# Patient Record
Sex: Male | Born: 1955
Health system: Southern US, Community
[De-identification: ages and names within clinical notes are randomized; demographics above are authoritative.]

## PROBLEM LIST (undated history)

## (undated) ENCOUNTER — Emergency Department (HOSPITAL_COMMUNITY): Admission: EM | Payer: Medicaid Other | Source: Home / Self Care

## (undated) DIAGNOSIS — K635 Polyp of colon: Secondary | ICD-10-CM

## (undated) DIAGNOSIS — G8929 Other chronic pain: Secondary | ICD-10-CM

## (undated) DIAGNOSIS — I10 Essential (primary) hypertension: Secondary | ICD-10-CM

## (undated) DIAGNOSIS — M069 Rheumatoid arthritis, unspecified: Secondary | ICD-10-CM

## (undated) DIAGNOSIS — K5790 Diverticulosis of intestine, part unspecified, without perforation or abscess without bleeding: Secondary | ICD-10-CM

## (undated) DIAGNOSIS — E119 Type 2 diabetes mellitus without complications: Secondary | ICD-10-CM

## (undated) DIAGNOSIS — I5042 Chronic combined systolic (congestive) and diastolic (congestive) heart failure: Secondary | ICD-10-CM

## (undated) DIAGNOSIS — K5730 Diverticulosis of large intestine without perforation or abscess without bleeding: Secondary | ICD-10-CM

## (undated) DIAGNOSIS — I4819 Other persistent atrial fibrillation: Secondary | ICD-10-CM

## (undated) DIAGNOSIS — K222 Esophageal obstruction: Secondary | ICD-10-CM

## (undated) DIAGNOSIS — I2510 Atherosclerotic heart disease of native coronary artery without angina pectoris: Secondary | ICD-10-CM

## (undated) DIAGNOSIS — N183 Chronic kidney disease, stage 3 unspecified: Secondary | ICD-10-CM

## (undated) DIAGNOSIS — K219 Gastro-esophageal reflux disease without esophagitis: Secondary | ICD-10-CM

## (undated) DIAGNOSIS — K2970 Gastritis, unspecified, without bleeding: Secondary | ICD-10-CM

## (undated) DIAGNOSIS — K449 Diaphragmatic hernia without obstruction or gangrene: Secondary | ICD-10-CM

## (undated) HISTORY — DX: Rheumatoid arthritis, unspecified: M06.9

## (undated) HISTORY — DX: Other chronic pain: G89.29

## (undated) HISTORY — DX: Morbid (severe) obesity due to excess calories: E66.01

## (undated) HISTORY — DX: Gastritis, unspecified, without bleeding: K29.70

## (undated) HISTORY — DX: Polyp of colon: K63.5

## (undated) HISTORY — DX: Chronic kidney disease, stage 3 (moderate): N18.3

## (undated) HISTORY — DX: Gastro-esophageal reflux disease without esophagitis: K21.9

## (undated) HISTORY — DX: Essential (primary) hypertension: I10

## (undated) HISTORY — DX: Esophageal obstruction: K22.2

## (undated) HISTORY — DX: Chronic kidney disease, stage 3 unspecified: N18.30

## (undated) HISTORY — DX: Diaphragmatic hernia without obstruction or gangrene: K44.9

## (undated) HISTORY — DX: Diverticulosis of intestine, part unspecified, without perforation or abscess without bleeding: K57.90

## (undated) HISTORY — DX: Other persistent atrial fibrillation: I48.19

## (undated) HISTORY — DX: Chronic combined systolic (congestive) and diastolic (congestive) heart failure: I50.42

## (undated) HISTORY — DX: Diverticulosis of large intestine without perforation or abscess without bleeding: K57.30

---

## 2013-07-29 DIAGNOSIS — K2970 Gastritis, unspecified, without bleeding: Secondary | ICD-10-CM

## 2013-07-29 DIAGNOSIS — K449 Diaphragmatic hernia without obstruction or gangrene: Secondary | ICD-10-CM

## 2013-07-29 HISTORY — DX: Diaphragmatic hernia without obstruction or gangrene: K44.9

## 2013-07-29 HISTORY — DX: Gastritis, unspecified, without bleeding: K29.70

## 2013-08-01 DIAGNOSIS — K635 Polyp of colon: Secondary | ICD-10-CM

## 2013-08-01 DIAGNOSIS — K5730 Diverticulosis of large intestine without perforation or abscess without bleeding: Secondary | ICD-10-CM

## 2013-08-01 HISTORY — DX: Diverticulosis of large intestine without perforation or abscess without bleeding: K57.30

## 2013-08-01 HISTORY — DX: Polyp of colon: K63.5

## 2013-09-11 HISTORY — PX: COLON RESECTION: SHX5231

## 2014-05-11 HISTORY — PX: CARDIAC CATHETERIZATION: SHX172

## 2014-09-11 HISTORY — PX: COLON SURGERY: SHX602

## 2014-12-23 ENCOUNTER — Encounter (HOSPITAL_COMMUNITY): Payer: Self-pay | Admitting: Family Medicine

## 2014-12-23 ENCOUNTER — Emergency Department (HOSPITAL_COMMUNITY): Payer: Medicaid - Out of State

## 2014-12-23 ENCOUNTER — Inpatient Hospital Stay (HOSPITAL_COMMUNITY)
Admission: EM | Admit: 2014-12-23 | Discharge: 2014-12-27 | DRG: 315 | Disposition: A | Discharge status: Home / Self Care | Payer: Medicaid - Out of State | Attending: Internal Medicine | Admitting: Internal Medicine

## 2014-12-23 DIAGNOSIS — N189 Chronic kidney disease, unspecified: Secondary | ICD-10-CM | POA: Diagnosis present

## 2014-12-23 DIAGNOSIS — R079 Chest pain, unspecified: Secondary | ICD-10-CM | POA: Diagnosis not present

## 2014-12-23 DIAGNOSIS — G629 Polyneuropathy, unspecified: Secondary | ICD-10-CM | POA: Diagnosis present

## 2014-12-23 DIAGNOSIS — N141 Nephropathy induced by other drugs, medicaments and biological substances: Secondary | ICD-10-CM | POA: Diagnosis present

## 2014-12-23 DIAGNOSIS — E119 Type 2 diabetes mellitus without complications: Secondary | ICD-10-CM | POA: Diagnosis present

## 2014-12-23 DIAGNOSIS — K279 Peptic ulcer, site unspecified, unspecified as acute or chronic, without hemorrhage or perforation: Secondary | ICD-10-CM | POA: Diagnosis present

## 2014-12-23 DIAGNOSIS — I25119 Atherosclerotic heart disease of native coronary artery with unspecified angina pectoris: Secondary | ICD-10-CM | POA: Diagnosis present

## 2014-12-23 DIAGNOSIS — I252 Old myocardial infarction: Secondary | ICD-10-CM | POA: Diagnosis not present

## 2014-12-23 DIAGNOSIS — Z794 Long term (current) use of insulin: Secondary | ICD-10-CM

## 2014-12-23 DIAGNOSIS — R7989 Other specified abnormal findings of blood chemistry: Secondary | ICD-10-CM

## 2014-12-23 DIAGNOSIS — Z7982 Long term (current) use of aspirin: Secondary | ICD-10-CM | POA: Diagnosis not present

## 2014-12-23 DIAGNOSIS — F40240 Claustrophobia: Secondary | ICD-10-CM | POA: Diagnosis present

## 2014-12-23 DIAGNOSIS — I2510 Atherosclerotic heart disease of native coronary artery without angina pectoris: Secondary | ICD-10-CM

## 2014-12-23 DIAGNOSIS — I25118 Atherosclerotic heart disease of native coronary artery with other forms of angina pectoris: Secondary | ICD-10-CM | POA: Diagnosis not present

## 2014-12-23 DIAGNOSIS — K5900 Constipation, unspecified: Secondary | ICD-10-CM | POA: Diagnosis present

## 2014-12-23 DIAGNOSIS — Z7952 Long term (current) use of systemic steroids: Secondary | ICD-10-CM | POA: Diagnosis not present

## 2014-12-23 DIAGNOSIS — I319 Disease of pericardium, unspecified: Principal | ICD-10-CM | POA: Diagnosis present

## 2014-12-23 DIAGNOSIS — R072 Precordial pain: Secondary | ICD-10-CM | POA: Diagnosis not present

## 2014-12-23 DIAGNOSIS — N179 Acute kidney failure, unspecified: Secondary | ICD-10-CM | POA: Diagnosis present

## 2014-12-23 DIAGNOSIS — E780 Pure hypercholesterolemia: Secondary | ICD-10-CM | POA: Diagnosis present

## 2014-12-23 DIAGNOSIS — T508X5A Adverse effect of diagnostic agents, initial encounter: Secondary | ICD-10-CM | POA: Diagnosis present

## 2014-12-23 DIAGNOSIS — D649 Anemia, unspecified: Secondary | ICD-10-CM | POA: Diagnosis not present

## 2014-12-23 DIAGNOSIS — I129 Hypertensive chronic kidney disease with stage 1 through stage 4 chronic kidney disease, or unspecified chronic kidney disease: Secondary | ICD-10-CM | POA: Diagnosis present

## 2014-12-23 DIAGNOSIS — E1165 Type 2 diabetes mellitus with hyperglycemia: Secondary | ICD-10-CM | POA: Diagnosis not present

## 2014-12-23 DIAGNOSIS — I4891 Unspecified atrial fibrillation: Secondary | ICD-10-CM

## 2014-12-23 DIAGNOSIS — N2889 Other specified disorders of kidney and ureter: Secondary | ICD-10-CM

## 2014-12-23 DIAGNOSIS — E785 Hyperlipidemia, unspecified: Secondary | ICD-10-CM

## 2014-12-23 DIAGNOSIS — E860 Dehydration: Secondary | ICD-10-CM | POA: Diagnosis present

## 2014-12-23 DIAGNOSIS — I429 Cardiomyopathy, unspecified: Secondary | ICD-10-CM | POA: Diagnosis present

## 2014-12-23 DIAGNOSIS — R778 Other specified abnormalities of plasma proteins: Secondary | ICD-10-CM

## 2014-12-23 DIAGNOSIS — I10 Essential (primary) hypertension: Secondary | ICD-10-CM | POA: Diagnosis not present

## 2014-12-23 DIAGNOSIS — I309 Acute pericarditis, unspecified: Secondary | ICD-10-CM | POA: Diagnosis not present

## 2014-12-23 HISTORY — DX: Type 2 diabetes mellitus without complications: E11.9

## 2014-12-23 HISTORY — DX: Atherosclerotic heart disease of native coronary artery without angina pectoris: I25.10

## 2014-12-23 LAB — LIPID PANEL
CHOLESTEROL: 127 mg/dL (ref 0–200)
HDL: 34 mg/dL — ABNORMAL LOW (ref >40)
LDL CALC: 55 mg/dL (ref 0–99)
TRIGLYCERIDES: 192 mg/dL — AB (ref <150)
Total CHOL/HDL Ratio: 3.7 RATIO
VLDL: 38 mg/dL (ref 0–40)

## 2014-12-23 LAB — I-STAT TROPONIN, ED: TROPONIN I, POC: 0.04 ng/mL (ref 0.00–0.08)

## 2014-12-23 LAB — BASIC METABOLIC PANEL
Anion gap: 12 (ref 5–15)
BUN: 8 mg/dL (ref 6–20)
CHLORIDE: 102 mmol/L (ref 101–111)
CO2: 25 mmol/L (ref 22–32)
Calcium: 8.9 mg/dL (ref 8.9–10.3)
Creatinine, Ser: 1.2 mg/dL (ref 0.61–1.24)
GFR calc non Af Amer: >60 mL/min (ref >60)
GLUCOSE: 175 mg/dL — AB (ref 65–99)
POTASSIUM: 3.5 mmol/L (ref 3.5–5.1)
Sodium: 139 mmol/L (ref 135–145)

## 2014-12-23 LAB — GLUCOSE, CAPILLARY: GLUCOSE-CAPILLARY: 393 mg/dL — AB (ref 65–99)

## 2014-12-23 LAB — CBC
HCT: 46.1 % (ref 39.0–52.0)
HCT: 46.5 % (ref 39.0–52.0)
HEMOGLOBIN: 14.7 g/dL (ref 13.0–17.0)
Hemoglobin: 14.6 g/dL (ref 13.0–17.0)
MCH: 30.1 pg (ref 26.0–34.0)
MCH: 30.4 pg (ref 26.0–34.0)
MCHC: 31.7 g/dL (ref 30.0–36.0)
MCHC: 31.6 g/dL (ref 30.0–36.0)
MCV: 95.1 fL (ref 78.0–100.0)
MCV: 96.3 fL (ref 78.0–100.0)
PLATELETS: 178 10*3/uL (ref 150–400)
Platelets: 186 10*3/uL (ref 150–400)
RBC: 4.85 MIL/uL (ref 4.22–5.81)
RBC: 4.83 MIL/uL (ref 4.22–5.81)
RDW: 14.5 % (ref 11.5–15.5)
RDW: 14.7 % (ref 11.5–15.5)
WBC: 9.5 10*3/uL (ref 4.0–10.5)
WBC: 10.6 10*3/uL — AB (ref 4.0–10.5)

## 2014-12-23 LAB — CREATININE, SERUM
CREATININE: 1.32 mg/dL — AB (ref 0.61–1.24)
GFR calc non Af Amer: 57 mL/min — ABNORMAL LOW (ref >60)

## 2014-12-23 LAB — TSH: TSH: 0.757 u[IU]/mL (ref 0.350–4.500)

## 2014-12-23 LAB — BRAIN NATRIURETIC PEPTIDE: B NATRIURETIC PEPTIDE 5: 76.5 pg/mL (ref 0.0–100.0)

## 2014-12-23 LAB — TROPONIN I: Troponin I: 0.06 ng/mL — ABNORMAL HIGH (ref <0.031)

## 2014-12-23 LAB — D-DIMER, QUANTITATIVE (NOT AT ARMC): D DIMER QUANT: 1.11 ug{FEU}/mL — AB (ref 0.00–0.48)

## 2014-12-23 MED ORDER — LISINOPRIL 20 MG PO TABS
30.0000 mg | ORAL_TABLET | Freq: Every day | ORAL | Status: DC
  Administered 2014-12-25: 30 mg via ORAL
  Filled 2014-12-23 (×2): qty 1

## 2014-12-23 MED ORDER — PANTOPRAZOLE SODIUM 40 MG PO TBEC
40.0000 mg | DELAYED_RELEASE_TABLET | Freq: Two times a day (BID) | ORAL | Status: DC
  Administered 2014-12-23 – 2014-12-27 (×8): 40 mg via ORAL
  Filled 2014-12-23 (×8): qty 1

## 2014-12-23 MED ORDER — INSULIN ASPART 100 UNIT/ML ~~LOC~~ SOLN
0.0000 [IU] | SUBCUTANEOUS | Status: DC
  Administered 2014-12-23: 9 [IU] via SUBCUTANEOUS
  Administered 2014-12-24 (×2): 3 [IU] via SUBCUTANEOUS
  Administered 2014-12-24: 5 [IU] via SUBCUTANEOUS

## 2014-12-23 MED ORDER — ONDANSETRON HCL 4 MG/2ML IJ SOLN: 4.0000 mg | Freq: Four times a day (QID) | INTRAMUSCULAR | Status: DC | PRN

## 2014-12-23 MED ORDER — ACETAMINOPHEN 325 MG PO TABS
650.0000 mg | ORAL_TABLET | ORAL | Status: DC | PRN
  Administered 2014-12-24 – 2014-12-26 (×2): 650 mg via ORAL
  Filled 2014-12-23 (×2): qty 2

## 2014-12-23 MED ORDER — FAMOTIDINE 20 MG PO TABS
10.0000 mg | ORAL_TABLET | Freq: Every day | ORAL | Status: DC
  Administered 2014-12-24 – 2014-12-25 (×2): 10 mg via ORAL
  Filled 2014-12-23 (×2): qty 1

## 2014-12-23 MED ORDER — ASPIRIN 81 MG PO CHEW
324.0000 mg | CHEWABLE_TABLET | Freq: Once | ORAL | Status: AC
  Administered 2014-12-23: 324 mg via ORAL
  Filled 2014-12-23: qty 4

## 2014-12-23 MED ORDER — NITROGLYCERIN 0.4 MG SL SUBL
0.4000 mg | SUBLINGUAL_TABLET | SUBLINGUAL | Status: DC | PRN
  Administered 2014-12-23 (×2): 0.4 mg via SUBLINGUAL
  Filled 2014-12-23 (×2): qty 1

## 2014-12-23 MED ORDER — MORPHINE SULFATE 4 MG/ML IJ SOLN
4.0000 mg | Freq: Once | INTRAMUSCULAR | Status: AC
Start: 2014-12-23 — End: 2014-12-23
  Administered 2014-12-23: 4 mg via INTRAVENOUS
  Filled 2014-12-23: qty 1

## 2014-12-23 MED ORDER — ATORVASTATIN CALCIUM 40 MG PO TABS
40.0000 mg | ORAL_TABLET | Freq: Every day | ORAL | Status: DC
Start: 2014-12-24 — End: 2014-12-27
  Administered 2014-12-24 – 2014-12-27 (×4): 40 mg via ORAL
  Filled 2014-12-23 (×4): qty 1

## 2014-12-23 MED ORDER — ASPIRIN EC 325 MG PO TBEC
325.0000 mg | DELAYED_RELEASE_TABLET | Freq: Every day | ORAL | Status: DC
  Administered 2014-12-24 – 2014-12-27 (×4): 325 mg via ORAL
  Filled 2014-12-23 (×4): qty 1

## 2014-12-23 MED ORDER — NITROGLYCERIN IN D5W 200-5 MCG/ML-% IV SOLN
0.0000 ug/min | INTRAVENOUS | Status: DC
  Administered 2014-12-23: 5 ug/min via INTRAVENOUS
  Administered 2014-12-24: 15 ug/min via INTRAVENOUS
  Filled 2014-12-23: qty 250

## 2014-12-23 MED ORDER — HEPARIN SODIUM (PORCINE) 5000 UNIT/ML IJ SOLN: 5000.0000 [IU] | Freq: Three times a day (TID) | INTRAMUSCULAR | Status: DC

## 2014-12-23 MED ORDER — CHLORHEXIDINE GLUCONATE CLOTH 2 % EX PADS
6.0000 | MEDICATED_PAD | Freq: Every day | CUTANEOUS | Status: DC
  Administered 2014-12-25: 6 via TOPICAL

## 2014-12-23 MED ORDER — ONDANSETRON HCL 4 MG/2ML IJ SOLN
4.0000 mg | Freq: Once | INTRAMUSCULAR | Status: AC
  Administered 2014-12-23: 4 mg via INTRAVENOUS
  Filled 2014-12-23: qty 2

## 2014-12-23 MED ORDER — CARVEDILOL 25 MG PO TABS
25.0000 mg | ORAL_TABLET | Freq: Two times a day (BID) | ORAL | Status: DC
  Administered 2014-12-24 – 2014-12-27 (×7): 25 mg via ORAL
  Filled 2014-12-23 (×7): qty 1

## 2014-12-23 MED ORDER — IOHEXOL 350 MG/ML SOLN
75.0000 mL | Freq: Once | INTRAVENOUS | Status: AC | PRN
  Administered 2014-12-23: 75 mL via INTRAVENOUS

## 2014-12-23 MED ORDER — INSULIN DETEMIR 100 UNIT/ML ~~LOC~~ SOLN
80.0000 [IU] | Freq: Every day | SUBCUTANEOUS | Status: DC
  Administered 2014-12-23 – 2014-12-24 (×2): 80 [IU] via SUBCUTANEOUS
  Filled 2014-12-23 (×3): qty 0.8

## 2014-12-23 MED ORDER — MORPHINE SULFATE 4 MG/ML IJ SOLN
4.0000 mg | Freq: Once | INTRAMUSCULAR | Status: AC
  Administered 2014-12-23: 4 mg via INTRAVENOUS
  Filled 2014-12-23: qty 1

## 2014-12-23 MED ORDER — HEPARIN BOLUS VIA INFUSION
4000.0000 [IU] | Freq: Once | INTRAVENOUS | Status: AC
  Administered 2014-12-23: 4000 [IU] via INTRAVENOUS
  Filled 2014-12-23: qty 4000

## 2014-12-23 MED ORDER — FUROSEMIDE 40 MG PO TABS
40.0000 mg | ORAL_TABLET | Freq: Every day | ORAL | Status: DC
  Administered 2014-12-24 – 2014-12-25 (×2): 40 mg via ORAL
  Filled 2014-12-23 (×2): qty 1

## 2014-12-23 MED ORDER — NITROGLYCERIN 0.4 MG/HR TD PT24
0.4000 mg | MEDICATED_PATCH | TRANSDERMAL | Status: DC
  Filled 2014-12-23: qty 1

## 2014-12-23 MED ORDER — GABAPENTIN 600 MG PO TABS
600.0000 mg | ORAL_TABLET | Freq: Three times a day (TID) | ORAL | Status: DC
  Administered 2014-12-23 – 2014-12-27 (×11): 600 mg via ORAL
  Filled 2014-12-23 (×11): qty 1

## 2014-12-23 MED ORDER — HEPARIN (PORCINE) IN NACL 100-0.45 UNIT/ML-% IJ SOLN
1200.0000 [IU]/h | INTRAMUSCULAR | Status: DC
  Administered 2014-12-23: 1200 [IU]/h via INTRAVENOUS
  Filled 2014-12-23 (×2): qty 250

## 2014-12-23 MED ORDER — DILTIAZEM HCL ER 180 MG PO CP24
180.0000 mg | ORAL_CAPSULE | Freq: Every day | ORAL | Status: DC
  Administered 2014-12-24 – 2014-12-27 (×4): 180 mg via ORAL
  Filled 2014-12-23 (×8): qty 1

## 2014-12-23 NOTE — ED Notes (Signed)
Patient transported to X-ray 

## 2014-12-23 NOTE — Progress Notes (Signed)
ANTICOAGULATION CONSULT NOTE - Initial Consult  Pharmacy Consult for Heparin  Indication: chest pain/ACS  No Known Allergies  Patient Measurements: Height: 5\' 6"  (167.6 cm) Weight: 225 lb 12 oz (102.4 kg) IBW/kg (Calculated) : 63.8  Vital Signs: Temp: 98.2 F (36.8 C) (05/14 2233) Temp Source: Oral (05/14 2233) BP: 137/71 mmHg (05/14 2233) Pulse Rate: 86 (05/14 2233)  Labs:  Recent Labs  12/23/14 1517 12/23/14 2150  HGB 14.6 14.7  HCT 46.1 46.5  PLT 186 178  CREATININE 1.20  --     Estimated Creatinine Clearance: 74.3 mL/min (by C-G formula based on Cr of 1.2).   Medical History: Past Medical History  Diagnosis Date  . Coronary artery disease   . Hypertension   . Diabetes mellitus without complication   . Arthritis     Assessment: 59 y/o M with chest pain, also has hx of afib with previous anticoagulation (but not taking now per MD due to anemia of unknown source), CBC is good today, renal function ok, other labs as above.   Goal of Therapy:  Heparin level 0.3-0.7 units/ml Monitor platelets by anticoagulation protocol: Yes   Plan:  -Heparin 4000 units BOLUS -Start heparin drip at 1200 units/hr -0700 HL -Daily CBC/HL -Monitor for bleeding  Narda Bonds 12/23/2014,10:43 PM

## 2014-12-23 NOTE — ED Provider Notes (Signed)
CSN: [id number]     Arrival date & time 12/23/14  1428 History   First MD Initiated Contact with Patient 12/23/14 1507     Chief Complaint  Patient presents with  . Chest Pain     (Consider location/radiation/quality/duration/timing/severity/associated sxs/prior Treatment) HPI  Pt presents with c/o chest pain and shortness of breath. He hzs hx of DM, HTN, CAD, paroxysmal afib.  Had MI in July 2015.  He states his symptoms feel similar to his prior MI, but also he cannot tell if it may be his reflux pain.  Due to symptoms lasting on and off since yesterday he presented to the ED for evaluation.  No fever/chills.  No cough.  No leg swelling.  He states symptoms are simlar to when he had MI in the past.  There are no other associated systemic symptoms, there are no other alleviating or modifying factors.   Past Medical History  Diagnosis Date  . Coronary artery disease   . Hypertension   . Diabetes mellitus without complication   . Arthritis    Past Surgical History  Procedure Laterality Date  . Colon surgery  09/2014    colon resection   . Cardiac catheterization  05/2014    ablation for atrial fibrillation   History reviewed. No pertinent family history. History  Substance Use Topics  . Smoking status: Never Smoker   . Smokeless tobacco: Not on file  . Alcohol Use: No    Review of Systems  ROS reviewed and all otherwise negative except for mentioned in HPI    Allergies  Review of patient's allergies indicates no known allergies.  Home Medications   Prior to Admission medications   Medication Sig Start Date End Date Taking? Authorizing Provider  aspirin EC 81 MG tablet Take 81 mg by mouth daily.   Yes Historical Provider, MD  atorvastatin (LIPITOR) 40 MG tablet Take 40 mg by mouth daily.   Yes Historical Provider, MD  carvedilol (COREG) 25 MG tablet Take 25 mg by mouth 2 (two) times daily.   Yes Historical Provider, MD  diltiazem (DILACOR XR) 180 MG 24 hr capsule Take  180 mg by mouth daily.    Yes Historical Provider, MD  furosemide (LASIX) 40 MG tablet Take 40 mg by mouth daily.   Yes Historical Provider, MD  gabapentin (NEURONTIN) 600 MG tablet Take 600 mg by mouth 3 (three) times daily.   Yes Historical Provider, MD  insulin detemir (LEVEMIR) 100 UNIT/ML injection Inject 80 Units into the skin at bedtime.   Yes Historical Provider, MD  Insulin Glulisine (APIDRA) 100 UNIT/ML Solostar Pen Inject 16 Units into the skin 2 (two) times daily.   Yes Historical Provider, MD  lisinopril (PRINIVIL,ZESTRIL) 30 MG tablet Take 30 mg by mouth daily.   Yes Historical Provider, MD  pantoprazole (PROTONIX) 40 MG tablet Take 40 mg by mouth 2 (two) times daily.   Yes Historical Provider, MD  Polyvinyl Alcohol-Povidone (REFRESH OP) Place 1 drop into both eyes 3 (three) times daily as needed (dry eyes).   Yes Historical Provider, MD  predniSONE (DELTASONE) 10 MG tablet Take 10 mg by mouth 2 (two) times daily.   Yes Historical Provider, MD  ranitidine (ZANTAC) 150 MG capsule Take 150 mg by mouth 2 (two) times daily.   Yes Historical Provider, MD  sildenafil (VIAGRA) 100 MG tablet Take 100 mg by mouth daily as needed for erectile dysfunction.   Yes Historical Provider, MD  sitaGLIPtin (JANUVIA) 100 MG tablet Take 100  mg by mouth daily.   Yes Historical Provider, MD   BP 117/79 mmHg  Pulse 72  Temp(Src) 98.8 F (37.1 C) (Oral)  Resp 18  Ht 5\' 6"  (1.676 m)  Wt 225 lb 12 oz (102.4 kg)  BMI 36.45 kg/m2  SpO2 95% Physical Exam  Physical Examination: General appearance - alert, well appearing, and in no distress Mental status - alert, oriented to person, place, and time Eyes - no conjunctival injection, no scleral icterus Mouth - mucous membranes moist, pharynx normal without lesions Chest - clear to auscultation, no wheezes, rales or rhonchi, symmetric air entry Heart - normal rate, regular rhythm, normal S1, S2, no murmurs, rubs, clicks or gallops Abdomen - soft, nontender,  nondistended, no masses or organomegaly Neurological - alert, oriented, normal speech, no focal findings or movement disorder noted Extremities - peripheral pulses normal, no pedal edema, no clubbing or cyanosis Skin - normal coloration and turgor, no rashes,  ED Course  Procedures (including critical care time)  7:52 PM pt states now he is chest pain free.   9:14 PM d/w Dr. Ernestina Patches, triad hospitalist.  He will see patient, requests cardiology consult.  I will page now.   9:38 PM d/w [name], cardiology- she will d/w Dr. Ernestina Patches about this patient.   Labs Review Labs Reviewed  BASIC METABOLIC PANEL - Abnormal; Notable for the following:    Glucose, Bld 175 (*)    All other components within normal limits  D-DIMER, QUANTITATIVE - Abnormal; Notable for the following:    D-Dimer, Quant 1.11 (*)    All other components within normal limits  TROPONIN I - Abnormal; Notable for the following:    Troponin I 0.06 (*)    All other components within normal limits  TROPONIN I - Abnormal; Notable for the following:    Troponin I 0.04 (*)    All other components within normal limits  TROPONIN I - Abnormal; Notable for the following:    Troponin I 0.04 (*)    All other components within normal limits  CBC - Abnormal; Notable for the following:    WBC 10.6 (*)    All other components within normal limits  CREATININE, SERUM - Abnormal; Notable for the following:    Creatinine, Ser 1.32 (*)    GFR calc non Af Amer 57 (*)    All other components within normal limits  LIPID PANEL - Abnormal; Notable for the following:    Triglycerides 192 (*)    HDL 34 (*)    All other components within normal limits  GLUCOSE, CAPILLARY - Abnormal; Notable for the following:    Glucose-Capillary 393 (*)    All other components within normal limits  CBC - Abnormal; Notable for the following:    WBC 11.3 (*)    All other components within normal limits  SEDIMENTATION RATE - Abnormal; Notable for the  following:    Sed Rate 20 (*)    All other components within normal limits  GLUCOSE, CAPILLARY - Abnormal; Notable for the following:    Glucose-Capillary 244 (*)    All other components within normal limits  GLUCOSE, CAPILLARY - Abnormal; Notable for the following:    Glucose-Capillary 221 (*)    All other components within normal limits  TROPONIN I - Abnormal; Notable for the following:    Troponin I 0.06 (*)    All other components within normal limits  BASIC METABOLIC PANEL - Abnormal; Notable for the following:    Chloride  100 (*)    Glucose, Bld 297 (*)    Creatinine, Ser 1.42 (*)    Calcium 8.6 (*)    GFR calc non Af Amer 53 (*)    All other components within normal limits  GLUCOSE, CAPILLARY - Abnormal; Notable for the following:    Glucose-Capillary 277 (*)    All other components within normal limits  MRSA PCR SCREENING  CBC  BRAIN NATRIURETIC PEPTIDE  TSH  HEPARIN LEVEL (UNFRACTIONATED)  C-REACTIVE PROTEIN  HEPARIN LEVEL (UNFRACTIONATED)  HEMOGLOBIN A1C  TROPONIN I  TROPONIN I  I-STAT TROPOININ, ED    Imaging Review Dg Chest 2 View  12/23/2014   CLINICAL DATA:  Mid chest pain beginning yesterday.  EXAM: CHEST  2 VIEW  COMPARISON:  None.  FINDINGS: Lungs are adequately inflated without consolidation or effusion. Cardiomediastinal silhouette is within normal. Mild degenerative change of the spine with prominent osteophytes over the mid to lower thoracic spine.  IMPRESSION: No active cardiopulmonary disease.   Electronically Signed   By: Marin Olp M.D.   On: 12/23/2014 16:11   Ct Angio Chest Pe W/cm &/or Wo Cm  12/23/2014   CLINICAL DATA:  59 year old male with chest pain and shortness of breath for 1 day.  EXAM: CT ANGIOGRAPHY CHEST WITH CONTRAST  TECHNIQUE: Multidetector CT imaging of the chest was performed using the standard protocol during bolus administration of intravenous contrast. Multiplanar CT image reconstructions and MIPs were obtained to evaluate the  vascular anatomy.  CONTRAST:  24mL OMNIPAQUE IOHEXOL 350 MG/ML SOLN  COMPARISON:  12/23/2014 chest radiograph  FINDINGS: This is a technically satisfactory study.  Mediastinum/Nodes: No pulmonary emboli are identified. There is no evidence of thoracic aortic aneurysm. Cardiomegaly is identified. Mild mild-moderate coronary artery calcifications are present. No enlarged lymph nodes. No pericardial effusion.  Lungs/Pleura: No pleural effusion identified. Mild dependent and bibasilar atelectasis noted. There is no evidence of airspace disease, consolidation, nodule or mass. No endobronchial or endotracheal lesions identified.  Upper abdomen: Hepatic steatosis and nonobstructing left upper pole renal calculi noted. A 2 cm indeterminate right upper renal lesion identified.  Musculoskeletal: No acute or suspicious bony abnormalities identified.  Review of the MIP images confirms the above findings.  IMPRESSION: No evidence of pulmonary emboli or thoracic aortic aneurysm.  Cardiomegaly and coronary artery disease.  Mild dependent and bibasilar atelectasis.  2 cm indeterminate right upper renal lesion. Consider further evaluation first with ultrasound if no prior outside studies are available for comparison.  Hepatic steatosis and nonobstructing left upper pole renal calculi.   Electronically Signed   By: Margarette Canada M.D.   On: 12/23/2014 18:50     EKG Interpretation   Date/Time:  Saturday Dec 23 2014 14:35:31 EDT Ventricular Rate:  75 PR Interval:  120 QRS Duration: 90 QT Interval:  382 QTC Calculation: 426 R Axis:   -7 Text Interpretation:  Normal sinus rhythm Left ventricular hypertrophy ST  \T\ T wave abnormality, consider inferior ischemia Abnormal ECG No old  tracing to compare Confirmed by [facility]  MD, Allegra Cerniglia [phone]) on 12/23/2014  3:19:50 PM      MDM   Final diagnoses:  Chest pain  Renal mass, right    Pt presenting with c/o chest pain and shortness of breath.  Pt states symptoms are similar  to his prior MI.  He is visiting from [place]- his doctor's are there.  Will work up for ACS, d-dimer due to recent traveling history.  Initial labs are reassuring, ekg does  show some t wave inversions in infeerior leads- there  Is no old EKG for comparison.  Pt to be admitted to triad for further evaluation. Pt has no chest pain after nitro and morphine in the ED.  [name] Kitchenpt is agreeable with this plan.      Alfonzo Beers, MD 12/24/14 [phone]

## 2014-12-23 NOTE — H&P (Addendum)
Hospitalist Admission History and Physical  Patient name: Marco [name] Medical record number: [id number] Date of birth: [birth year] Age: 59 y.o. Gender: male  Primary Care Provider: Pcp Not In System  Chief Complaint: chest pain  History of Present Illness:This is a 59 y.o. year old male with significant past medical history of IDDM, HTN, atrial fibrillation s/p ablation, CAD s/p MI 02/2014, HTN presenting with chest pain. Pt is originally from [place]. Has been in area for the past 1-2 weeks. Patient reports progressive chest pain over the past 2 days. Chest pain mainly central in nature with some radiation down the arms by bilaterally. Mild associated nausea. Pain also worse with deep breathing. Denies any recent heavy lifting or trauma. Has been compliant with medication regimen at home. States that he went to his cardiologist in [place] approximately 1-2 months ago with a fairly normal stress test. Presented to the ER afebrile, hemodynamically stable. CBC and blood work otherwise within normal limits. Troponin negative 1. EKG normal sinus rhythm with noted persistent T-wave inversions in anterior lateral leads. No old EKG for comparison. Patient states chest pains consistent with prior MI back in July of last year. CT angina negative for PE. Does show cardiomegaly. Pain improved w/ ASA, sublingual NTG and morphine.  HEART Score 6+  Assessment and Plan: Marco [name] is a 59 y.o. year old male presenting with Chest Pain    Active Problems:   Chest pain   1- Chest Pain  -overlapping mixed and typical sxs-noted anterior chest wall TTP  -higher concern for cardiac source of sxs given similar sxs w/ MI 02/2014 -trp neg x 1 -EKG w/ noted TWI in lateral laeads -pending formal cards consult (I have asked EDP for formal consult given pt's prior cardiac history) -full dose ASA -2D ECHO  -NTG -cycle CEs  -risk stratification labs -tele bed  -follow   2-CAD/HTN -noted concern for active  disease given above -full dose ASA -NTG  -BP stable  -cont home regimen -f/u cards input  3-IDDM -cont levemir -SSI  -A1C  4-Atrial fibrillation s/p ablation -NSR on EKG -cont CCB -noted hx/o anticoagulation assd anemia w/ NOAC (no source found)- currently not taking.  -tele bed -follow up cards recs  5-Neuropathy -stable  -cont neurontin    FEN/GI: heart healthy-carb modified diet  Prophylaxis: sub q heparin  Disposition: pending further evaluation Code Status: Full Code   Clinical Update: Discussed case w/ [name] w/ cardiology. Would like to place pt on full dose heparin overnight. Hgb stable. Cards will formally consult in am.  Patient Active Problem List   Diagnosis Date Noted  . Chest pain 12/23/2014   Past Medical History: Past Medical History  Diagnosis Date  . Coronary artery disease   . Hypertension   . Diabetes mellitus without complication   . Arthritis     Past Surgical History: History reviewed. No pertinent past surgical history.  Social History: History   Social History  . Marital Status: Married    Spouse Name: N/A  . Number of Children: N/A  . Years of Education: N/A   Social History Main Topics  . Smoking status: Never Smoker   . Smokeless tobacco: Not on file  . Alcohol Use: No  . Drug Use: No  . Sexual Activity: Not on file   Other Topics Concern  . None   Social History Narrative  . None    Family History: History reviewed. No pertinent family history.  Allergies: No Known Allergies  Current Facility-Administered Medications  Medication Dose Route Frequency Provider Last Rate Last Dose  . acetaminophen (TYLENOL) tablet 650 mg  650 mg Oral Q4H PRN Deneise Lever, MD      . aspirin EC tablet 325 mg  325 mg Oral Daily Deneise Lever, MD      . atorvastatin (LIPITOR) tablet 40 mg  40 mg Oral Daily Deneise Lever, MD      . carvedilol (COREG) tablet 25 mg  25 mg Oral BID Deneise Lever, MD      . diltiazem  (DILACOR XR) 24 hr capsule 180 mg  180 mg Oral Daily Deneise Lever, MD      . famotidine (PEPCID) tablet 10 mg  10 mg Oral Daily Deneise Lever, MD      . furosemide (LASIX) tablet 40 mg  40 mg Oral Daily Deneise Lever, MD      . gabapentin (NEURONTIN) tablet 600 mg  600 mg Oral TID Deneise Lever, MD      . heparin injection 5,000 Units  5,000 Units Subcutaneous 3 times per day Deneise Lever, MD      . insulin detemir (LEVEMIR) injection 80 Units  80 Units Subcutaneous QHS Deneise Lever, MD      . lisinopril (PRINIVIL,ZESTRIL) tablet 30 mg  30 mg Oral Daily Deneise Lever, MD      . nitroGLYCERIN (NITROSTAT) SL tablet 0.4 mg  0.4 mg Sublingual Q5 min PRN Alfonzo Beers, MD   0.4 mg at 12/23/14 1721  . ondansetron (ZOFRAN) injection 4 mg  4 mg Intravenous Q6H PRN Deneise Lever, MD      . pantoprazole (PROTONIX) EC tablet 40 mg  40 mg Oral BID Deneise Lever, MD       Current Outpatient Prescriptions  Medication Sig Dispense Refill  . aspirin EC 81 MG tablet Take 81 mg by mouth daily.    [name] atorvastatin (LIPITOR) 40 MG tablet Take 40 mg by mouth daily.    . carvedilol (COREG) 25 MG tablet Take 25 mg by mouth 2 (two) times daily.    [name] diltiazem (DILACOR XR) 180 MG 24 hr capsule Take 180 mg by mouth daily.     . furosemide (LASIX) 40 MG tablet Take 40 mg by mouth daily.    [name] gabapentin (NEURONTIN) 600 MG tablet Take 600 mg by mouth 3 (three) times daily.    . insulin detemir (LEVEMIR) 100 UNIT/ML injection Inject 80 Units into the skin at bedtime.    . Insulin Glulisine (APIDRA) 100 UNIT/ML Solostar Pen Inject 16 Units into the skin 2 (two) times daily.    [name] lisinopril (PRINIVIL,ZESTRIL) 30 MG tablet Take 30 mg by mouth daily.    . pantoprazole (PROTONIX) 40 MG tablet Take 40 mg by mouth 2 (two) times daily.    . Polyvinyl Alcohol-Povidone (REFRESH OP) Place 1 drop into both eyes 3 (three) times daily as needed (dry eyes).    . predniSONE (DELTASONE) 10 MG tablet Take 10 mg by mouth  2 (two) times daily.    . ranitidine (ZANTAC) 150 MG capsule Take 150 mg by mouth 2 (two) times daily.    . sildenafil (VIAGRA) 100 MG tablet Take 100 mg by mouth daily as needed for erectile dysfunction.    . sitaGLIPtin (JANUVIA) 100 MG tablet Take 100 mg by mouth daily.     Review Of Systems: 12 point ROS negative except as noted above in HPI.  Physical Exam: Filed Vitals:   12/23/14 2100  BP: 120/65  Pulse: 64  Temp:   Resp:     General: alert, cooperative and moderately obese HEENT: PERRLA and extra ocular movement intact Heart: S1, S2 normal, no murmur, rub or gallop, regular rate and rhythm Lungs: clear to auscultation, unlabored breathing and mild anterior chest wall pain  Abdomen: abdomen is soft without significant tenderness, masses, organomegaly or guarding Extremities: extremities normal, atraumatic, no cyanosis or edema Skin:no rashes Neurology: normal without focal findings  Labs and Imaging: Lab Results  Component Value Date/Time   NA 139 12/23/2014 03:17 PM   K 3.5 12/23/2014 03:17 PM   CL 102 12/23/2014 03:17 PM   CO2 25 12/23/2014 03:17 PM   BUN 8 12/23/2014 03:17 PM   CREATININE 1.20 12/23/2014 03:17 PM   GLUCOSE 175* 12/23/2014 03:17 PM   Lab Results  Component Value Date   WBC 9.5 12/23/2014   HGB 14.6 12/23/2014   HCT 46.1 12/23/2014   MCV 95.1 12/23/2014   PLT 186 12/23/2014    Dg Chest 2 View  12/23/2014   CLINICAL DATA:  Mid chest pain beginning yesterday.  EXAM: CHEST  2 VIEW  COMPARISON:  None.  FINDINGS: Lungs are adequately inflated without consolidation or effusion. Cardiomediastinal silhouette is within normal. Mild degenerative change of the spine with prominent osteophytes over the mid to lower thoracic spine.  IMPRESSION: No active cardiopulmonary disease.   Electronically Signed   By: Marin Olp M.D.   On: 12/23/2014 16:11   Ct Angio Chest Pe W/cm &/or Wo Cm  12/23/2014   CLINICAL DATA:  59 year old male with chest pain and  shortness of breath for 1 day.  EXAM: CT ANGIOGRAPHY CHEST WITH CONTRAST  TECHNIQUE: Multidetector CT imaging of the chest was performed using the standard protocol during bolus administration of intravenous contrast. Multiplanar CT image reconstructions and MIPs were obtained to evaluate the vascular anatomy.  CONTRAST:  37mL OMNIPAQUE IOHEXOL 350 MG/ML SOLN  COMPARISON:  12/23/2014 chest radiograph  FINDINGS: This is a technically satisfactory study.  Mediastinum/Nodes: No pulmonary emboli are identified. There is no evidence of thoracic aortic aneurysm. Cardiomegaly is identified. Mild mild-moderate coronary artery calcifications are present. No enlarged lymph nodes. No pericardial effusion.  Lungs/Pleura: No pleural effusion identified. Mild dependent and bibasilar atelectasis noted. There is no evidence of airspace disease, consolidation, nodule or mass. No endobronchial or endotracheal lesions identified.  Upper abdomen: Hepatic steatosis and nonobstructing left upper pole renal calculi noted. A 2 cm indeterminate right upper renal lesion identified.  Musculoskeletal: No acute or suspicious bony abnormalities identified.  Review of the MIP images confirms the above findings.  IMPRESSION: No evidence of pulmonary emboli or thoracic aortic aneurysm.  Cardiomegaly and coronary artery disease.  Mild dependent and bibasilar atelectasis.  2 cm indeterminate right upper renal lesion. Consider further evaluation first with ultrasound if no prior outside studies are available for comparison.  Hepatic steatosis and nonobstructing left upper pole renal calculi.   Electronically Signed   By: Margarette Canada M.D.   On: 12/23/2014 18:50           Shanda Howells MD  Pager: [phone]

## 2014-12-23 NOTE — ED Notes (Signed)
Pt here for chest pain and SOB that started last night.

## 2014-12-24 ENCOUNTER — Inpatient Hospital Stay (HOSPITAL_COMMUNITY): Payer: Medicaid - Out of State

## 2014-12-24 DIAGNOSIS — I10 Essential (primary) hypertension: Secondary | ICD-10-CM

## 2014-12-24 DIAGNOSIS — R079 Chest pain, unspecified: Secondary | ICD-10-CM

## 2014-12-24 DIAGNOSIS — R7989 Other specified abnormal findings of blood chemistry: Secondary | ICD-10-CM

## 2014-12-24 DIAGNOSIS — N189 Chronic kidney disease, unspecified: Secondary | ICD-10-CM

## 2014-12-24 DIAGNOSIS — R778 Other specified abnormalities of plasma proteins: Secondary | ICD-10-CM

## 2014-12-24 DIAGNOSIS — I2510 Atherosclerotic heart disease of native coronary artery without angina pectoris: Secondary | ICD-10-CM

## 2014-12-24 DIAGNOSIS — I4891 Unspecified atrial fibrillation: Secondary | ICD-10-CM

## 2014-12-24 DIAGNOSIS — E119 Type 2 diabetes mellitus without complications: Secondary | ICD-10-CM

## 2014-12-24 DIAGNOSIS — E785 Hyperlipidemia, unspecified: Secondary | ICD-10-CM

## 2014-12-24 DIAGNOSIS — K279 Peptic ulcer, site unspecified, unspecified as acute or chronic, without hemorrhage or perforation: Secondary | ICD-10-CM

## 2014-12-24 LAB — BASIC METABOLIC PANEL
Anion gap: 11 (ref 5–15)
BUN: 14 mg/dL (ref 6–20)
CHLORIDE: 100 mmol/L — AB (ref 101–111)
CO2: 27 mmol/L (ref 22–32)
CREATININE: 1.42 mg/dL — AB (ref 0.61–1.24)
Calcium: 8.6 mg/dL — ABNORMAL LOW (ref 8.9–10.3)
GFR calc Af Amer: >60 mL/min (ref >60)
GFR calc non Af Amer: 53 mL/min — ABNORMAL LOW (ref >60)
Glucose, Bld: 297 mg/dL — ABNORMAL HIGH (ref 65–99)
Potassium: 3.6 mmol/L (ref 3.5–5.1)
Sodium: 138 mmol/L (ref 135–145)

## 2014-12-24 LAB — GLUCOSE, CAPILLARY
GLUCOSE-CAPILLARY: 244 mg/dL — AB (ref 65–99)
GLUCOSE-CAPILLARY: 277 mg/dL — AB (ref 65–99)
GLUCOSE-CAPILLARY: 299 mg/dL — AB (ref 65–99)
GLUCOSE-CAPILLARY: 349 mg/dL — AB (ref 65–99)
Glucose-Capillary: 221 mg/dL — ABNORMAL HIGH (ref 65–99)

## 2014-12-24 LAB — TROPONIN I
TROPONIN I: 0.04 ng/mL — AB (ref <0.031)
Troponin I: 0.04 ng/mL — ABNORMAL HIGH (ref <0.031)
Troponin I: 0.06 ng/mL — ABNORMAL HIGH (ref <0.031)
Troponin I: 0.04 ng/mL — ABNORMAL HIGH
Troponin I: 0.04 ng/mL — ABNORMAL HIGH (ref <0.031)

## 2014-12-24 LAB — CBC
HCT: 43.7 % (ref 39.0–52.0)
HEMOGLOBIN: 14.2 g/dL (ref 13.0–17.0)
MCH: 30.9 pg (ref 26.0–34.0)
MCHC: 32.5 g/dL (ref 30.0–36.0)
MCV: 95.2 fL (ref 78.0–100.0)
PLATELETS: 187 10*3/uL (ref 150–400)
RBC: 4.59 MIL/uL (ref 4.22–5.81)
RDW: 14.8 % (ref 11.5–15.5)
WBC: 11.3 10*3/uL — AB (ref 4.0–10.5)

## 2014-12-24 LAB — HEPARIN LEVEL (UNFRACTIONATED)
Heparin Unfractionated: 0.65 IU/mL (ref 0.30–0.70)
Heparin Unfractionated: 0.66 [IU]/mL (ref 0.30–0.70)

## 2014-12-24 LAB — SEDIMENTATION RATE: Sed Rate: 20 mm/hr — ABNORMAL HIGH (ref 0–16)

## 2014-12-24 LAB — MRSA PCR SCREENING: MRSA by PCR: NEGATIVE

## 2014-12-24 LAB — C-REACTIVE PROTEIN: CRP: 0.6 mg/dL (ref <1.0)

## 2014-12-24 MED ORDER — INSULIN ASPART 100 UNIT/ML ~~LOC~~ SOLN
0.0000 [IU] | Freq: Three times a day (TID) | SUBCUTANEOUS | Status: DC
  Administered 2014-12-24: 5 [IU] via SUBCUTANEOUS
  Administered 2014-12-24: 7 [IU] via SUBCUTANEOUS
  Administered 2014-12-25 (×2): 3 [IU] via SUBCUTANEOUS
  Administered 2014-12-25 (×2): 2 [IU] via SUBCUTANEOUS
  Administered 2014-12-26 (×2): 1 [IU] via SUBCUTANEOUS
  Administered 2014-12-26: 3 [IU] via SUBCUTANEOUS
  Administered 2014-12-26: 2 [IU] via SUBCUTANEOUS
  Administered 2014-12-27 (×2): 3 [IU] via SUBCUTANEOUS

## 2014-12-24 MED ORDER — HYDROCODONE-ACETAMINOPHEN 5-325 MG PO TABS
1.0000 | ORAL_TABLET | Freq: Four times a day (QID) | ORAL | Status: DC | PRN
  Administered 2014-12-24 – 2014-12-26 (×3): 1 via ORAL
  Filled 2014-12-24 (×3): qty 1

## 2014-12-24 MED ORDER — INSULIN ASPART 100 UNIT/ML ~~LOC~~ SOLN
8.0000 [IU] | Freq: Three times a day (TID) | SUBCUTANEOUS | Status: DC
  Administered 2014-12-24 – 2014-12-25 (×3): 8 [IU] via SUBCUTANEOUS

## 2014-12-24 NOTE — Progress Notes (Signed)
Pt in last 12 hours only peed 175 ml total even after 40 mg of lasix given in am. MD notified. Will continue to monitor I/O closely and lab kidney values.

## 2014-12-24 NOTE — Progress Notes (Signed)
Patient Name: Marco [name] Date of Encounter: 12/24/2014  PROBLEM LIST  Principal Problem:   Chest pain Active Problems:   Elevated troponin   CAD (coronary artery disease)   PAF (paroxysmal atrial fibrillation)   Essential hypertension   HLD (hyperlipidemia)   AKI (acute kidney injury)   PUD (peptic ulcer disease)   DM2 (diabetes mellitus, type 2)     SUBJECTIVE  Patient complaining of chest pain this AM.  He has had continuous chest pain for several days.  It is worse with inspiration and position changes.   CURRENT MEDS . aspirin EC  325 mg Oral Daily  . atorvastatin  40 mg Oral Daily  . carvedilol  25 mg Oral BID WC  . Chlorhexidine Gluconate Cloth  6 each Topical Q0600  . diltiazem  180 mg Oral Daily  . famotidine  10 mg Oral Daily  . furosemide  40 mg Oral Daily  . gabapentin  600 mg Oral TID  . insulin aspart  0-9 Units Subcutaneous 6 times per day  . insulin detemir  80 Units Subcutaneous QHS  . lisinopril  30 mg Oral Daily  . pantoprazole  40 mg Oral BID    OBJECTIVE  Filed Vitals:   12/24/14 0048 12/24/14 0453 12/24/14 0727 12/24/14 0905  BP: 149/69 138/83 138/83 114/78  Pulse: 68 71  77  Temp:  97.8 F (36.6 C)  98.9 F (37.2 C)  TempSrc:  Oral  Oral  Resp:  18  18  Height:      Weight:      SpO2:  94%  97%    Intake/Output Summary (Last 24 hours) at 12/24/14 1006 Last data filed at 12/24/14 0726  Gross per 24 hour  Intake 102.38 ml  Output    600 ml  Net -497.62 ml   Filed Weights   12/23/14 2233  Weight: 225 lb 12 oz (102.4 kg)    PHYSICAL EXAM  GEN: Well nourished, well developed, in no acute distress. HEENT: normal. Neck: Supple, no JVD  Cardiac: RRR, no murmurs, rubs, or gallops. No edema.     Chest: Tender to palpation about the sternum Respiratory:  Respirations regular and unlabored, clear to auscultation bilaterally. GI: Soft, nontender, nondistended MS: no deformity or atrophy. Skin: warm and dry, no rash. Psych:  Normal affect.  Accessory Clinical Findings  CBC  Recent Labs  12/23/14 2150 12/24/14 0319  WBC 10.6* 11.3*  HGB 14.7 14.2  HCT 46.5 43.7  MCV 96.3 95.2  PLT 178 974   Basic Metabolic Panel  Recent Labs  12/23/14 1517 12/23/14 2150  NA 139  --   K 3.5  --   CL 102  --   CO2 25  --   GLUCOSE 175*  --   BUN 8  --   CREATININE 1.20 1.32*  CALCIUM 8.9  --    Liver Function Tests No results for input(s): AST, ALT, ALKPHOS, BILITOT, PROT, ALBUMIN in the last 72 hours. No results for input(s): LIPASE, AMYLASE in the last 72 hours. Cardiac Enzymes  Recent Labs  12/23/14 2150 12/24/14 0031 12/24/14 0319  TROPONINI 0.06* 0.04* 0.04*   BNP (last 3 results)  Recent Labs  12/23/14 1517  BNP 76.5   D-Dimer  Recent Labs  12/23/14 1603  DDIMER 1.11*   Hemoglobin A1C No results for input(s): HGBA1C in the last 72 hours. Fasting Lipid Panel  Recent Labs  12/23/14 2149  CHOL 127  HDL 34*  LDLCALC 55  TRIG 192*  CHOLHDL 3.7   Thyroid Function Tests  Recent Labs  12/23/14 2150  TSH 0.757    TELE  NSR  ECG  NSR, HR 70, inf-lat TWI, no change since yesterday  RADIOLOGY/STUDIES  Dg Chest 2 View  12/23/2014   CLINICAL DATA:  Mid chest pain beginning yesterday.  EXAM: CHEST  2 VIEW  COMPARISON:  None.  FINDINGS: Lungs are adequately inflated without consolidation or effusion. Cardiomediastinal silhouette is within normal. Mild degenerative change of the spine with prominent osteophytes over the mid to lower thoracic spine.  IMPRESSION: No active cardiopulmonary disease.   Electronically Signed   By: Marin Olp M.D.   On: 12/23/2014 16:11   Ct Angio Chest Pe W/cm &/or Wo Cm  12/23/2014   CLINICAL DATA:  59 year old male with chest pain and shortness of breath for 1 day.  EXAM: CT ANGIOGRAPHY CHEST WITH CONTRAST  TECHNIQUE: Multidetector CT imaging of the chest was performed using the standard protocol during bolus administration of intravenous  contrast. Multiplanar CT image reconstructions and MIPs were obtained to evaluate the vascular anatomy.  CONTRAST:  8m OMNIPAQUE IOHEXOL 350 MG/ML SOLN  COMPARISON:  12/23/2014 chest radiograph  FINDINGS: This is a technically satisfactory study.  Mediastinum/Nodes: No pulmonary emboli are identified. There is no evidence of thoracic aortic aneurysm. Cardiomegaly is identified. Mild mild-moderate coronary artery calcifications are present. No enlarged lymph nodes. No pericardial effusion.  Lungs/Pleura: No pleural effusion identified. Mild dependent and bibasilar atelectasis noted. There is no evidence of airspace disease, consolidation, nodule or mass. No endobronchial or endotracheal lesions identified.  Upper abdomen: Hepatic steatosis and nonobstructing left upper pole renal calculi noted. A 2 cm indeterminate right upper renal lesion identified.  Musculoskeletal: No acute or suspicious bony abnormalities identified.  Review of the MIP images confirms the above findings.  IMPRESSION: No evidence of pulmonary emboli or thoracic aortic aneurysm.  Cardiomegaly and coronary artery disease.  Mild dependent and bibasilar atelectasis.  2 cm indeterminate right upper renal lesion. Consider further evaluation first with ultrasound if no prior outside studies are available for comparison.  Hepatic steatosis and nonobstructing left upper pole renal calculi.   Electronically Signed   By: JMargarette CanadaM.D.   On: 12/23/2014 18:50    PATIENT SUMMARY  59year old man with a history of CAD status post myocardial infarction 02/2014, diabetes, HTN, HL, atrial fibrillation status post prior ablation. [place] to [place] emergency room last night with progressive chest pain over the past 2 days. [place] had similar symptoms and [place] Patient notes a normal stress test at that time. Pain is similar to previous angina. There is also a pleuritic component.  CRP and ESR are normal. Troponins have been minimally elevated. Chest CT negative for pulmonary embolism.  ASSESSMENT AND PLAN  1. Chest Pain:  Atypical for ischemia. He tells me that he had similar symptoms 2 months ago and had a normal stress test. He is clearly tender to palpation on exam. He has a history of peptic ulcer disease and cannot take NSAIDs. Echocardiogram is pending. I will place him on Vicodin as needed. Cardiac enzymes are minimally elevated without clear trend. Sedimentation rate and CRP not indicative of pericarditis. May ultimately need to consider cardiac catheterization. 2. CAD: Continue heparin, nitroglycerin IV, aspirin, statin, beta blocker, calcium channel blocker, ACE inhibitor. Continue to cycle enzymes. Will request old records. 3. Hypertension: Controlled. 4. Hyperlipidemia: Continue  statin. 5. R renal lesion on chest CT:  Will get dedicated renal ultrasound. 6. Diabetes: Check blood glucose every before meals and daily at bedtime 7. AKI:  Increased creatinine likely related to die from chest CT. Repeat BMET in morning. 8. PUD: Continue PPI.   Signed, [name], [name]  12/24/2014, 10:06 AM     Agree with assessment as noted above.  The patient states that he has had 2 previous cardiac catheterizations, most recently several months ago.  This was done at good [facility] in [place].  He states that the cardiac catheterization was normal.  We will try to get those records.  He also states that he had a nuclear stress test several months ago which was normal.  He has multiple risk factors for coronary disease including diabetes hypertension treated hypercholesterolemia and family history with his father having had coronary disease and a pacemaker. On examination his lungs are clear.  He is in no distress.  He is tender over the lower sternum and in the upper epigastrium.  I do not hear any murmur gallop or rub.  Extremities show excellent  pedal pulses and no phlebitis or edema. Plan: Obtain echocardiogram.  Try to obtain records from outlying hospital concerning his previous cardiac catheterizations.

## 2014-12-24 NOTE — Progress Notes (Signed)
Pt has 40/ml average output over 24 hours with 40mg  lasix AM of 5/15. Pt denies pain with urination but endorses hesitancy to begin stream. NP Baltazar Najjar paged, asked for fluid bolus or any other interventions as deemed necessary. Will continue to monitor closely.

## 2014-12-24 NOTE — Consult Note (Addendum)
Admit date: 12/23/2014 Referring Physician  Dr. Ernestina Patches Primary Physician  None Primary Cardiologist  In [place] Reason for Consultation  Chest pain  HPI: This is a 59 y.o. year old male with significant past medical history of IDDM, HTN, atrial fibrillation s/p ablation, CAD s/p MI 02/2014, HTN presenting with chest pain. Pt is originally from [place]. Has been in area for the past 1-2 weeks and is moving down here.  No old medical records available at this time.  Patient reports progressive chest pain over the past 2 days. Chest pain mainly central in nature with some radiation down the arms bilaterally. Mild associated nausea. Pain also worse with deep breathing. Denies any recent heavy lifting or trauma. Has been compliant with medication regimen at home. States that he was admitted to the hospital with similar symptoms about 1-2 months ago in [place] and had a stress test that was reportedly "normal".  He said that they thought it was musculoskeletal pain.  He says this has been going off and on for some time and will settle down for a while and then come back "with a vengence".  In the ER,  CBC and blood work otherwise within normal limits. Troponin negative 1 but second trop slightly elevated at 0.06. EKG normal sinus rhythm with  T-wave inversions in anterior lateral leads. No old EKG for comparison. Patient states chest pains consistent with prior MI back in July of last year but does have a pleuritic component to it as well.  He says that the pain was pressure and sharp with inspiration with his prior MI as well. CT angio of the chest was negative for PE. Chest pain improved w/ ASA, sublingual NTG and morphine. Cardiology is now asked to consult for further evaluation.      PMH:   Past Medical History  Diagnosis Date  . Coronary artery disease   . Hypertension   . Diabetes mellitus without complication   . Arthritis      PSH:   Past Surgical History  Procedure Laterality Date  . Colon  surgery  09/2014    colon resection   . Cardiac catheterization  05/2014    ablation for atrial fibrillation    Allergies:  Review of patient's allergies indicates no known allergies. Prior to Admit Meds:   Prescriptions prior to admission  Medication Sig Dispense Refill Last Dose  . aspirin EC 81 MG tablet Take 81 mg by mouth daily.   12/23/2014 at Unknown time  . atorvastatin (LIPITOR) 40 MG tablet Take 40 mg by mouth daily.   12/23/2014 at Unknown time  . carvedilol (COREG) 25 MG tablet Take 25 mg by mouth 2 (two) times daily.   12/23/2014 at 0900  . diltiazem (DILACOR XR) 180 MG 24 hr capsule Take 180 mg by mouth daily.    12/23/2014 at Unknown time  . furosemide (LASIX) 40 MG tablet Take 40 mg by mouth daily.   12/23/2014 at Unknown time  . gabapentin (NEURONTIN) 600 MG tablet Take 600 mg by mouth 3 (three) times daily.   12/23/2014 at am  . insulin detemir (LEVEMIR) 100 UNIT/ML injection Inject 80 Units into the skin at bedtime.   12/22/2014 at Unknown time  . Insulin Glulisine (APIDRA) 100 UNIT/ML Solostar Pen Inject 16 Units into the skin 2 (two) times daily.   12/23/2014 at am  . lisinopril (PRINIVIL,ZESTRIL) 30 MG tablet Take 30 mg by mouth daily.   12/23/2014 at Unknown time  . pantoprazole (  PROTONIX) 40 MG tablet Take 40 mg by mouth 2 (two) times daily.   12/23/2014 at am  . Polyvinyl Alcohol-Povidone (REFRESH OP) Place 1 drop into both eyes 3 (three) times daily as needed (dry eyes).   12/21/2014  . predniSONE (DELTASONE) 10 MG tablet Take 10 mg by mouth 2 (two) times daily.   12/23/2014 at am  . ranitidine (ZANTAC) 150 MG capsule Take 150 mg by mouth 2 (two) times daily.   12/23/2014 at am  . sildenafil (VIAGRA) 100 MG tablet Take 100 mg by mouth daily as needed for erectile dysfunction.   6 months ago  . sitaGLIPtin (JANUVIA) 100 MG tablet Take 100 mg by mouth daily.   12/23/2014 at Unknown time   Fam HX:   History reviewed. No pertinent family history. Social HX:    History   Social  History  . Marital Status: Married    Spouse Name: N/A  . Number of Children: N/A  . Years of Education: N/A   Occupational History  . Not on file.   Social History Main Topics  . Smoking status: Never Smoker   . Smokeless tobacco: Not on file  . Alcohol Use: No  . Drug Use: No  . Sexual Activity: Not on file   Other Topics Concern  . Not on file   Social History Narrative  . No narrative on file     ROS:  All 11 ROS were addressed and are negative except what is stated in the HPI  Physical Exam: Blood pressure 137/71, pulse 86, temperature 98.2 F (36.8 C), temperature source Oral, resp. rate 18, height 5\' 6"  (1.676 m), weight 225 lb 12 oz (102.4 kg), SpO2 94 %.    General: Well developed, well nourished, in no acute distress Head: Eyes PERRLA, No xanthomas.   Normal cephalic and atramatic  Lungs:   Clear bilaterally to auscultation and percussion. Heart:   HRRR S1 S2 Pulses are 2+ & equal.            No carotid bruit. No JVD.  No abdominal bruits. No femoral bruits. Abdomen: Bowel sounds are positive, abdomen soft and non-tender without masses or                  Hernia's noted. Msk:  Back normal, normal gait. Normal strength and tone for age. Extremities:   No clubbing, cyanosis or edema.  DP +1 Neuro: Alert and oriented X 3. Psych:  Good affect, responds appropriately    Labs:   Lab Results  Component Value Date   WBC 10.6* 12/23/2014   HGB 14.7 12/23/2014   HCT 46.5 12/23/2014   MCV 96.3 12/23/2014   PLT 178 12/23/2014    Recent Labs Lab 12/23/14 1517 12/23/14 2150  NA 139  --   K 3.5  --   CL 102  --   CO2 25  --   BUN 8  --   CREATININE 1.20 1.32*  CALCIUM 8.9  --   GLUCOSE 175*  --    No results found for: PTT No results found for: INR, PROTIME Lab Results  Component Value Date   TROPONINI 0.06* 12/23/2014     Lab Results  Component Value Date   CHOL 127 12/23/2014   Lab Results  Component Value Date   HDL 34* 12/23/2014   Lab  Results  Component Value Date   LDLCALC 55 12/23/2014   Lab Results  Component Value Date   TRIG 192* 12/23/2014  Lab Results  Component Value Date   CHOLHDL 3.7 12/23/2014   No results found for: LDLDIRECT    Radiology:  Dg Chest 2 View  12/23/2014   CLINICAL DATA:  Mid chest pain beginning yesterday.  EXAM: CHEST  2 VIEW  COMPARISON:  None.  FINDINGS: Lungs are adequately inflated without consolidation or effusion. Cardiomediastinal silhouette is within normal. Mild degenerative change of the spine with prominent osteophytes over the mid to lower thoracic spine.  IMPRESSION: No active cardiopulmonary disease.   Electronically Signed   By: Marin Olp M.D.   On: 12/23/2014 16:11   Ct Angio Chest Pe W/cm &/or Wo Cm  12/23/2014   CLINICAL DATA:  59 year old male with chest pain and shortness of breath for 1 day.  EXAM: CT ANGIOGRAPHY CHEST WITH CONTRAST  TECHNIQUE: Multidetector CT imaging of the chest was performed using the standard protocol during bolus administration of intravenous contrast. Multiplanar CT image reconstructions and MIPs were obtained to evaluate the vascular anatomy.  CONTRAST:  57mL OMNIPAQUE IOHEXOL 350 MG/ML SOLN  COMPARISON:  12/23/2014 chest radiograph  FINDINGS: This is a technically satisfactory study.  Mediastinum/Nodes: No pulmonary emboli are identified. There is no evidence of thoracic aortic aneurysm. Cardiomegaly is identified. Mild mild-moderate coronary artery calcifications are present. No enlarged lymph nodes. No pericardial effusion.  Lungs/Pleura: No pleural effusion identified. Mild dependent and bibasilar atelectasis noted. There is no evidence of airspace disease, consolidation, nodule or mass. No endobronchial or endotracheal lesions identified.  Upper abdomen: Hepatic steatosis and nonobstructing left upper pole renal calculi noted. A 2 cm indeterminate right upper renal lesion identified.  Musculoskeletal: No acute or suspicious bony abnormalities  identified.  Review of the MIP images confirms the above findings.  IMPRESSION: No evidence of pulmonary emboli or thoracic aortic aneurysm.  Cardiomegaly and coronary artery disease.  Mild dependent and bibasilar atelectasis.  2 cm indeterminate right upper renal lesion. Consider further evaluation first with ultrasound if no prior outside studies are available for comparison.  Hepatic steatosis and nonobstructing left upper pole renal calculi.   Electronically Signed   By: Margarette Canada M.D.   On: 12/23/2014 18:50    EKG:  NSR with LVH by voltage and T wave inversions in the inferolateral leads.  ASSESSMENT/PLAN: 1.  Chest pain with typical and atypical components.  He says it is similar to the pain he had with his MI last summer.  It  is worse with deep breathing but was like that with his prior MI.  He has been having this pain off and on for a while and was hospitalized 1-2 months ago for similar pain and stress myoview reportedly normal by patient.  It was felt that he had musculoskeletal pain.  He has T wave changes on EKG in the inferolateral leads but also has LVH so this could be due to repolarization abnormality.  There is no old EKG available for comparison.  Initial troponin is normal and second trop is mildly elevated at 0.06.  Continue ASA/statin/BB.  Cycle cardiac enzymes. Agree with IV Heparin full dose until rule out complete.  Check 2D echo in am to assess LVF and evaluate for pericardial effusion.  I do not hear a rub on exam. Will check sed rate and CRP which can be elevated in pericarditis.   Chest CT neg for PE.   2.  ASCAD with MI 02/2014.  I do not have a cath report available.  Will try to get results in the am.  Apparently he had a stress test a few months back that was fine.  Will try to get old records in am. 3.  PAF s/p afib ablation - maintaining NSR on BB.  He is not on chronic anticoagulation.  His CHADS2VASC score is 3.   4.  HTN - controlled on BB/ACE I and cardizem 5.  IDDM  - per PCP 6.  Dyslipidemia with LDL at goal at 55 - continue statin  Sueanne Margarita, MD  12/24/2014  12:13 AM

## 2014-12-24 NOTE — Progress Notes (Signed)
Pt states medical records to be requested from the following:  [name] performed  Dr. Milagros Loll - [facility], [place] performed

## 2014-12-24 NOTE — Progress Notes (Signed)
[facility] PROGRESS NOTE  Marco [name] QVZ:[id number] DOB: [birth year] DOA: 12/23/2014 PCP: Pcp Not In System  Assessment/Plan: 1. Chest pain -Patient with established history of coronary artery disease having a myocardial infarction in 2015, recently moved to the area from [place] where he has received most of his care. Medical records are pending. -He remains on IV heparin along with aspirin, statin, beta blocker and ACE inhibitor. -Troponins remained stable overnight. Check stat x-ray showed no acute changes.  -Currently reporting that chest pain has eased off. -Pending transthoracic echocardiogram -Cardiology following, considering cardiac catheterization  2.  Hypertension. -Blood pressures remain stable, continue carvedilol 25 mg by mouth twice a day, diltiazem 180 mg by mouth daily, Lasix 40 mg by mouth daily, lisinopril 30 mg by mouth daily.  3.  Insulin-dependent diabetes mellitus -Patient's blood sugars remain elevated fluctuating in the 200-300 range -Currently on Levemir 80 units subcutaneous at bedtime, will provide 8 units of mealtime coverage  4.  Dyslipidemia -Continue Lipitor 40 mg by mouth daily  5.  Right renal lesion. -Patient having a 2 cm indeterminate right upper renal lesion L reported on CT scan of lungs. This is being further worked up with renal ultrasound  Code Status: Full code Family Communication:  Disposition Plan: Anticipate discharge home when medically stable   Consultants:  Cardiology   HPI/Subjective: Patient is a pleasant 59 year old with a past medical history of coronary artery disease status post myocardial infarction in 2015, atrial fibrillation, presented to the emergency department on 12/23/2014 with complaints of chest pain having atypical features. Initial workup included a two-view chest x-ray did not reveal acute cardiopulmonary disease. Troponins were cycled and remained stable overnight. Cardiology was consulted, pending  transthoracic echocardiogram, considering cardiac catheterization.  Objective: Filed Vitals:   12/24/14 1155  BP: 117/79  Pulse: 72  Temp: 98.8 F (37.1 C)  Resp: 18    Intake/Output Summary (Last 24 hours) at 12/24/14 1306 Last data filed at 12/24/14 1143  Gross per 24 hour  Intake 342.38 ml  Output    675 ml  Net -332.62 ml   Filed Weights   12/23/14 2233  Weight: 102.4 kg (225 lb 12 oz)    Exam:   General:  Patient reporting chest pain has "eased off" he is awake and alert oriented, states tolerating by mouth intake  Cardiovascular: Regular rate and rhythm normal S1-S2 no murmurs rubs or gallops  Respiratory: Normal respiratory effort, lungs are clear  Abdomen: Soft nontender nondistended  Musculoskeletal: Trace edema to lower extremities bilaterally  Data Reviewed: Basic Metabolic Panel:  Recent Labs Lab 12/23/14 1517 12/23/14 2150  NA 139  --   K 3.5  --   CL 102  --   CO2 25  --   GLUCOSE 175*  --   BUN 8  --   CREATININE 1.20 1.32*  CALCIUM 8.9  --    Liver Function Tests: No results for input(s): AST, ALT, ALKPHOS, BILITOT, PROT, ALBUMIN in the last 168 hours. No results for input(s): LIPASE, AMYLASE in the last 168 hours. No results for input(s): AMMONIA in the last 168 hours. CBC:  Recent Labs Lab 12/23/14 1517 12/23/14 2150 12/24/14 0319  WBC 9.5 10.6* 11.3*  HGB 14.6 14.7 14.2  HCT 46.1 46.5 43.7  MCV 95.1 96.3 95.2  PLT 186 178 187   Cardiac Enzymes:  Recent Labs Lab 12/23/14 2150 12/24/14 0031 12/24/14 0319  TROPONINI 0.06* 0.04* 0.04*   BNP (last 3 results)  Recent Labs  12/23/14 1517  BNP 76.5    ProBNP (last 3 results) No results for input(s): PROBNP in the last 8760 hours.  CBG:  Recent Labs Lab 12/23/14 2244 12/24/14 0500 12/24/14 0724 12/24/14 1126  GLUCAP 393* 244* 221* 277*    Recent Results (from the past 240 hour(s))  MRSA PCR Screening     Status: None   Collection Time: 12/24/14 12:15 AM   Result Value Ref Range Status   MRSA by PCR NEGATIVE NEGATIVE Final    Comment:        The GeneXpert MRSA Assay (FDA approved for NASAL specimens only), is one component of a comprehensive MRSA colonization surveillance program. It is not intended to diagnose MRSA infection nor to guide or monitor treatment for MRSA infections.      Studies: Dg Chest 2 View  12/23/2014   CLINICAL DATA:  Mid chest pain beginning yesterday.  EXAM: CHEST  2 VIEW  COMPARISON:  None.  FINDINGS: Lungs are adequately inflated without consolidation or effusion. Cardiomediastinal silhouette is within normal. Mild degenerative change of the spine with prominent osteophytes over the mid to lower thoracic spine.  IMPRESSION: No active cardiopulmonary disease.   Electronically Signed   By: Marin Olp M.D.   On: 12/23/2014 16:11   Ct Angio Chest Pe W/cm &/or Wo Cm  12/23/2014   CLINICAL DATA:  59 year old male with chest pain and shortness of breath for 1 day.  EXAM: CT ANGIOGRAPHY CHEST WITH CONTRAST  TECHNIQUE: Multidetector CT imaging of the chest was performed using the standard protocol during bolus administration of intravenous contrast. Multiplanar CT image reconstructions and MIPs were obtained to evaluate the vascular anatomy.  CONTRAST:  55mL OMNIPAQUE IOHEXOL 350 MG/ML SOLN  COMPARISON:  12/23/2014 chest radiograph  FINDINGS: This is a technically satisfactory study.  Mediastinum/Nodes: No pulmonary emboli are identified. There is no evidence of thoracic aortic aneurysm. Cardiomegaly is identified. Mild mild-moderate coronary artery calcifications are present. No enlarged lymph nodes. No pericardial effusion.  Lungs/Pleura: No pleural effusion identified. Mild dependent and bibasilar atelectasis noted. There is no evidence of airspace disease, consolidation, nodule or mass. No endobronchial or endotracheal lesions identified.  Upper abdomen: Hepatic steatosis and nonobstructing left upper pole renal calculi  noted. A 2 cm indeterminate right upper renal lesion identified.  Musculoskeletal: No acute or suspicious bony abnormalities identified.  Review of the MIP images confirms the above findings.  IMPRESSION: No evidence of pulmonary emboli or thoracic aortic aneurysm.  Cardiomegaly and coronary artery disease.  Mild dependent and bibasilar atelectasis.  2 cm indeterminate right upper renal lesion. Consider further evaluation first with ultrasound if no prior outside studies are available for comparison.  Hepatic steatosis and nonobstructing left upper pole renal calculi.   Electronically Signed   By: Margarette Canada M.D.   On: 12/23/2014 18:50    Scheduled Meds: . aspirin EC  325 mg Oral Daily  . atorvastatin  40 mg Oral Daily  . carvedilol  25 mg Oral BID WC  . Chlorhexidine Gluconate Cloth  6 each Topical Q0600  . diltiazem  180 mg Oral Daily  . famotidine  10 mg Oral Daily  . furosemide  40 mg Oral Daily  . gabapentin  600 mg Oral TID  . insulin aspart  0-9 Units Subcutaneous 6 times per day  . insulin detemir  80 Units Subcutaneous QHS  . lisinopril  30 mg Oral Daily  . pantoprazole  40 mg Oral BID   Continuous Infusions: . heparin 1,200  Units/hr (12/23/14 2338)  . nitroGLYCERIN 15 mcg/min (12/24/14 0728)    Principal Problem:   Chest pain Active Problems:   Elevated troponin   CAD (coronary artery disease)   PAF (paroxysmal atrial fibrillation)   Essential hypertension   HLD (hyperlipidemia)   AKI (acute kidney injury)   PUD (peptic ulcer disease)   DM2 (diabetes mellitus, type 2)    Time spent: 79 min    Kelvin Cellar  [facility] Pager [phone]. If 7PM-7AM, please contact night-coverage at [email or web address], password [facility] 12/24/2014, 1:06 PM  LOS: 1 day

## 2014-12-24 NOTE — Progress Notes (Addendum)
ANTICOAGULATION CONSULT NOTE - Follow Up Consult  Pharmacy Consult for Heparin Indication: chest pain/ACS  No Known Allergies  Patient Measurements: Height: 5\' 6"  (167.6 cm) Weight: 225 lb 12 oz (102.4 kg) IBW/kg (Calculated) : 63.8 Heparin Dosing Weight: 86kg  Vital Signs: Temp: 98.9 F (37.2 C) (05/15 0905) Temp Source: Oral (05/15 0905) BP: 114/78 mmHg (05/15 0905) Pulse Rate: 77 (05/15 0905)  Labs:  Recent Labs  12/23/14 1517 12/23/14 2150 12/24/14 0031 12/24/14 0319 12/24/14 0719  HGB 14.6 14.7  --  14.2  --   HCT 46.1 46.5  --  43.7  --   PLT 186 178  --  187  --   HEPARINUNFRC  --   --   --   --  0.65  CREATININE 1.20 1.32*  --   --   --   TROPONINI  --  0.06* 0.04* 0.04*  --     Estimated Creatinine Clearance: 67.5 mL/min (by C-G formula based on Cr of 1.32).   Medications:  Heparin @ 1200 units/hr  Assessment: 58yom was started on heparin last night for possible ACS. Initial heparin level is at goal. CBC is stable. No bleeding reported.  Goal of Therapy:  Heparin level 0.3-0.7 units/ml Monitor platelets by anticoagulation protocol: Yes   Plan:  1) Continue heparin at 1200 units/hr 2) Check 6 hour heparin level to confirm  Deboraha Sprang 12/24/2014,9:25 AM  Addendum: Confirmatory heparin level is at goal at 0.66. Continue heparin at 1200 units/hr and follow up AM labs.  Deboraha Sprang 12/24/2014, 1:30 PM

## 2014-12-24 NOTE — Progress Notes (Signed)
  Echocardiogram 2D Echocardiogram has been performed.  Diamond Nickel 12/24/2014, 12:51 PM

## 2014-12-25 ENCOUNTER — Inpatient Hospital Stay (HOSPITAL_COMMUNITY): Payer: Medicaid - Out of State

## 2014-12-25 ENCOUNTER — Ambulatory Visit (HOSPITAL_COMMUNITY): Payer: Medicaid - Out of State

## 2014-12-25 ENCOUNTER — Other Ambulatory Visit (HOSPITAL_COMMUNITY): Payer: Medicaid - Out of State

## 2014-12-25 DIAGNOSIS — R7989 Other specified abnormal findings of blood chemistry: Secondary | ICD-10-CM

## 2014-12-25 DIAGNOSIS — I480 Paroxysmal atrial fibrillation: Secondary | ICD-10-CM

## 2014-12-25 DIAGNOSIS — R072 Precordial pain: Secondary | ICD-10-CM

## 2014-12-25 DIAGNOSIS — I10 Essential (primary) hypertension: Secondary | ICD-10-CM

## 2014-12-25 DIAGNOSIS — E785 Hyperlipidemia, unspecified: Secondary | ICD-10-CM

## 2014-12-25 LAB — GLUCOSE, CAPILLARY
GLUCOSE-CAPILLARY: 236 mg/dL — AB (ref 65–99)
GLUCOSE-CAPILLARY: 196 mg/dL — AB (ref 65–99)
Glucose-Capillary: 223 mg/dL — ABNORMAL HIGH (ref 65–99)
Glucose-Capillary: 154 mg/dL — ABNORMAL HIGH (ref 65–99)

## 2014-12-25 LAB — BASIC METABOLIC PANEL
Anion gap: 11 (ref 5–15)
BUN: 16 mg/dL (ref 6–20)
CHLORIDE: 103 mmol/L (ref 101–111)
CO2: 24 mmol/L (ref 22–32)
Calcium: 8.5 mg/dL — ABNORMAL LOW (ref 8.9–10.3)
Creatinine, Ser: 1.51 mg/dL — ABNORMAL HIGH (ref 0.61–1.24)
GFR, EST AFRICAN AMERICAN: 57 mL/min — AB (ref >60)
GFR, EST NON AFRICAN AMERICAN: 49 mL/min — AB (ref >60)
GLUCOSE: 346 mg/dL — AB (ref 65–99)
POTASSIUM: 4.7 mmol/L (ref 3.5–5.1)
SODIUM: 138 mmol/L (ref 135–145)

## 2014-12-25 LAB — CBC
HEMATOCRIT: 41.1 % (ref 39.0–52.0)
HEMOGLOBIN: 13.1 g/dL (ref 13.0–17.0)
MCH: 31.2 pg (ref 26.0–34.0)
MCHC: 31.9 g/dL (ref 30.0–36.0)
MCV: 97.9 fL (ref 78.0–100.0)
Platelets: 217 10*3/uL (ref 150–400)
RBC: 4.2 MIL/uL — AB (ref 4.22–5.81)
RDW: 15.2 % (ref 11.5–15.5)
WBC: 9.7 10*3/uL (ref 4.0–10.5)

## 2014-12-25 LAB — TROPONIN I
TROPONIN I: 0.06 ng/mL — AB (ref <0.031)
TROPONIN I: 0.04 ng/mL — AB (ref <0.031)
Troponin I: 0.04 ng/mL — ABNORMAL HIGH (ref <0.031)
Troponin I: 0.03 ng/mL (ref <0.031)

## 2014-12-25 LAB — HEPARIN LEVEL (UNFRACTIONATED): Heparin Unfractionated: 0.51 IU/mL (ref 0.30–0.70)

## 2014-12-25 MED ORDER — LORAZEPAM 1 MG PO TABS
1.0000 mg | ORAL_TABLET | ORAL | Status: DC | PRN
  Administered 2014-12-25: 1 mg via ORAL
  Filled 2014-12-25: qty 1

## 2014-12-25 MED ORDER — FAMOTIDINE 20 MG PO TABS
20.0000 mg | ORAL_TABLET | Freq: Every day | ORAL | Status: DC
  Administered 2014-12-26 – 2014-12-27 (×2): 20 mg via ORAL
  Filled 2014-12-25 (×2): qty 1

## 2014-12-25 MED ORDER — INSULIN ASPART 100 UNIT/ML ~~LOC~~ SOLN
15.0000 [IU] | Freq: Three times a day (TID) | SUBCUTANEOUS | Status: DC
  Administered 2014-12-25 – 2014-12-27 (×6): 15 [IU] via SUBCUTANEOUS

## 2014-12-25 MED ORDER — SODIUM CHLORIDE 0.9 % IV SOLN
INTRAVENOUS | Status: DC
  Administered 2014-12-25: 12:00:00 via INTRAVENOUS

## 2014-12-25 MED ORDER — INSULIN DETEMIR 100 UNIT/ML ~~LOC~~ SOLN
85.0000 [IU] | Freq: Every day | SUBCUTANEOUS | Status: DC
  Administered 2014-12-25 – 2014-12-26 (×2): 85 [IU] via SUBCUTANEOUS
  Filled 2014-12-25 (×3): qty 0.85

## 2014-12-25 MED ORDER — SODIUM CHLORIDE 0.9 % IV BOLUS (SEPSIS)
500.0000 mL | Freq: Once | INTRAVENOUS | Status: AC
  Administered 2014-12-25: 500 mL via INTRAVENOUS

## 2014-12-25 NOTE — Progress Notes (Addendum)
Inpatient Diabetes Program Recommendations  AACE/ADA: New Consensus Statement on Inpatient Glycemic Control (2013)  Target Ranges:  Prepandial:   less than 140 mg/dL      Peak postprandial:   less than 180 mg/dL (1-2 hours)      Critically ill patients:  140 - 180 mg/dL   Results for Marco [name], Marco [name] (MRN [id number]) as of 12/25/2014 09:37  Ref. Range 12/24/2014 07:24 12/24/2014 11:26 12/24/2014 16:32 12/24/2014 21:18 12/25/2014 07:23  Glucose-Capillary Latest Ref Range: 65-99 mg/dL 221 (H) 277 (H) 299 (H) 349 (H) 236 (H)   Reason for Visit: CP  Diabetes history: DM 2 Outpatient Diabetes medications: Levemir 80 units QHS, Apidra 16 units BID, Januvia 100 mg Daily Current orders for Inpatient glycemic control: Levemir 80 units QHS, Novolog 0-9 units TID, Novolog 8 units TID meal coverage  Inpatient Diabetes Program Recommendations  Insulin - Basal: If patient's glucose is still elevated at lunch, please consider increasing basal insulin to Levemir 85 units QHS. Insulin - Meal Coverage: Patient takes 16 units of meal coverage at home. Please also consider increasing meal coverage to Novolog 16 units TID with meals in addition to correction.  Thanks,  Tama Headings RN, MSN, [facility] Inpatient Diabetes Coordinator Team Pager [phone]

## 2014-12-25 NOTE — Progress Notes (Signed)
NP Baltazar Najjar paged to report 1) rise in pt creatinine to 1.51 from 1.20 on admission 2)approx urine output of 30ml/hr over 24 hours. Will continue to monitor closely.

## 2014-12-25 NOTE — Progress Notes (Signed)
Faxed medical record request to  [place] [facility] Milagros Loll - [facility], [place], [place]

## 2014-12-25 NOTE — Progress Notes (Addendum)
Patient Name: Marco [name] Date of Encounter: 12/25/2014  Primary Cardiologist: previously in [place] before moving here   Principal Problem:   Chest pain Active Problems:   Elevated troponin   CAD (coronary artery disease)   PAF (paroxysmal atrial fibrillation)   Essential hypertension   HLD (hyperlipidemia)   AKI (acute kidney injury)   PUD (peptic ulcer disease)   DM2 (diabetes mellitus, type 2)    SUBJECTIVE  States he still has occasional CP, similar to before his last MI.  He says the CP comes and goes while he is at rest. Denies exertional component.  States he is constipated and had one hard BM this morning.  States he has some SOB when CP is worst.  CURRENT MEDS . aspirin EC  325 mg Oral Daily  . atorvastatin  40 mg Oral Daily  . carvedilol  25 mg Oral BID WC  . Chlorhexidine Gluconate Cloth  6 each Topical Q0600  . diltiazem  180 mg Oral Daily  . famotidine  10 mg Oral Daily  . furosemide  40 mg Oral Daily  . gabapentin  600 mg Oral TID  . insulin aspart  0-9 Units Subcutaneous TID WC & HS  . insulin aspart  8 Units Subcutaneous TID WC  . insulin detemir  80 Units Subcutaneous QHS  . lisinopril  30 mg Oral Daily  . pantoprazole  40 mg Oral BID   OBJECTIVE  Filed Vitals:   12/24/14 2000 12/25/14 0022 12/25/14 0400 12/25/14 0855  BP: 118/69 121/68 133/78 118/65  Pulse: 68 68 70 72  Temp: 98.4 F (36.9 C) 98.2 F (36.8 C) 98.7 F (37.1 C) 98.7 F (37.1 C)  TempSrc: Oral Oral Oral Oral  Resp: _0 Height:      Weight:   228 lb 11.2 oz (103.738 kg)   SpO2: 93% 96% 94% 94%    Intake/Output Summary (Last 24 hours) at 12/25/14 0942 Last data filed at 12/25/14 0521  Gross per 24 hour  Intake    480 ml  Output    625 ml  Net   -145 ml   Filed Weights   12/23/14 2233 12/25/14 0400  Weight: 225 lb 12 oz (102.4 kg) 228 lb 11.2 oz (103.738 kg)   PHYSICAL EXAM  General: Pleasant, NAD. Neuro: Alert and oriented X 3. Moves all extremities  spontaneously. Psych: Flat affect. HEENT:  Normal  Neck: Supple without bruits or JVD. Lungs:  Resp regular and unlabored, CTA. Heart: RRR no s3, s4, or murmurs. Abdomen: Soft, non-distended, BS + x 4. +tenderness with palpation Extremities: No clubbing, cyanosis or edema. DP/PT/Radials 2+ and equal bilaterally.  Pain out of proportion to exam when palpating legs, arms, abdomen, and chest. States this has been happening for months.  Accessory Clinical Findings  CBC  Recent Labs  12/24/14 0319 12/25/14 0350  WBC 11.3* 9.7  HGB 14.2 13.1  HCT 43.7 41.1  MCV 95.2 97.9  PLT 187 962   Basic Metabolic Panel  Recent Labs  12/24/14 1100 12/25/14 0350  NA 138 138  K 3.6 4.7  CL 100* 103  CO2 27 24  GLUCOSE 297* 346*  BUN 14 16  CREATININE 1.42* 1.51*  CALCIUM 8.6* 8.5*   Cardiac Enzymes  Recent Labs  12/24/14 1524 12/24/14 2131 12/25/14 0350  TROPONINI 0.04* 0.04* 0.06*   D-Dimer  Recent Labs  12/23/14 1603  DDIMER 1.11*   Fasting Lipid Panel  Recent Labs  12/23/14 2149  CHOL 127  HDL 34*  LDLCALC 55  TRIG 192*  CHOLHDL 3.7   Thyroid Function Tests  Recent Labs  12/23/14 2150  TSH 0.757    TELE NSR in 60s-70s. Lots of artifact.  ECG NSR @ 80 bpm. 12/24/14  Radiology/Studies  Dg Chest 2 View  12/23/2014   CLINICAL DATA:  Mid chest pain beginning yesterday.  EXAM: CHEST  2 VIEW  COMPARISON:  None.  FINDINGS: Lungs are adequately inflated without consolidation or effusion. Cardiomediastinal silhouette is within normal. Mild degenerative change of the spine with prominent osteophytes over the mid to lower thoracic spine.  IMPRESSION: No active cardiopulmonary disease.     Ct Angio Chest Pe W/cm &/or Wo Cm  12/23/2014   CLINICAL DATA:  59 year old male with chest pain and shortness of breath for 1 day.  IMPRESSION: No evidence of pulmonary emboli or thoracic aortic aneurysm.  Cardiomegaly and coronary artery disease.  Mild dependent and  bibasilar atelectasis.  2 cm indeterminate right upper renal lesion. Consider further evaluation first with ultrasound if no prior outside studies are available for comparison.  Hepatic steatosis and nonobstructing left upper pole renal calculi.    Echo 12/24/14 Study Conclusions  - Left ventricle: The cavity size was normal. Wall thickness was increased in a pattern of moderate LVH. Systolic function was normal. The estimated ejection fraction was in the range of 50% to 55%. Wall motion was normal; there were no regional wall motion abnormalities. Doppler parameters are consistent with abnormal left ventricular relaxation (grade 1 diastolic dysfunction).  ASSESSMENT AND PLAN  1. Chest pain  - Atypical presentation for ischemia  - Similar to previous angina,   - Reports normal stress in past several months. Record requested.    - Reportedly had normal cath several month ago at [facility] in [place] has no clear trend, mildly elevated but flat.   - + D-dimer, Chest CT negative for PE. CRP negative  - Echo shows EF of 55-97%, grade 1 diastolic dysfunction. Result reassuring.   - characteristics of CP certainly atypical and worse with deep palpation and deep inspiration. If out record is negative, unlikely to do any further cardiac testing.    2.CAD (coronary artery disease)    -Recent work-up as listed in #1.  -Cont heparin, Nitro, ASA, statin, BB, CCB, ACEI.  3. PAF (paroxysmal atrial fibrillation)    -Rate controlled. No afib noted thus far during admission.  4. Essential hypertension    -Cont carvedilol, diltiazem, lasix, and lisinopril   5. Hyperlipidemia  -Continue statin   6. AKI (acute kidney injury)  - Increased Cr likely related to dye from Chest Ct.   - Still trending up.  Will monitor and repeat BMET in AM.  - Consider fluids    7. PUD (peptic ulcer disease)    - Continue PPI  8. DM2 (diabetes mellitus, type 2)  - Levemir  with mealtime coverage per IM.  33. Hypertrigleridemia  Signed, Almyra Deforest PA Pager: [phone]   The patient was seen, examined and discussed with Almyra Deforest, [name] and I agree with the above.   59 year old male with atypical chest pain, h/o CAD, MI in 02/2014, recently moved here from [place] where he had his prior work up done, we are awaiting records from recent stress test and cath - both presumably normal.  The symptoms are atypical, related to position change and deep inspiration.  We will perform cardiac MRI to rule  out pericarditis. ESR is mildly elevated. Mild troponin elevation is flat, he is on iv Heparin, on and off chest pain now. We will review his records and decide about further testing, most probably none. We will consider adding imdur/ranolazine. His crea continues to rise probably contrast nephropathy. I would hold lasix.  BP is controlled.   Dorothy Spark 12/25/2014

## 2014-12-25 NOTE — Care Management Note (Addendum)
Case Management Note  Patient Details  Name: Marco [name] MRN: [id number] Date of Birth: 1956-02-26  Subjective/Objective:  Pt admitted for CP.                 Action/Plan: CM did speak with the Financial Counselor in regards to Medicaid out of State. CM will make pt aware that he will need to contact the Department of Social Services if planning on staying in [place] to fill out Medicaid Application. Once pt is medically stable for d/c pt will need Rx for generic medications. His [place] Medicaid will not cover Rx drugs here in [place]. CM did set pt up with the [facility] for Hospital Follow Up Appointment.  No further needs from CM at this time.    Expected Discharge Date:  12/25/14               Expected Discharge Plan:  Home/Self Care  In-House Referral:  Financial Counselor  Discharge planning Services  CM Consult  Post Acute Care Choice:    Choice offered to:     DME Arranged:    DME Agency:     HH Arranged:    HH Agency:     Status of Service:     Medicare Important Message Given:    Date Medicare IM Given:    Medicare IM give by:    Date Additional Medicare IM Given:    Additional Medicare Important Message give by:     If discussed at [place] of Stay Meetings, dates discussed:    Additional Comments:  Bethena Roys, RN 12/25/2014, 10:37 AM

## 2014-12-25 NOTE — Progress Notes (Signed)
Patient unable to tolerate MRI exam even with meds for anxiety due to claustrophobia. Patient states it is too tight on his arms and he cannot do it. Dr Meda Coffee and Ceasar RN notified.

## 2014-12-25 NOTE — Progress Notes (Signed)
MD Alejandro Mulling notified of 2 runs of [facility] 5 beat and 7 beat at 2042 on 12/25/14. Pt asymptomatic. Will continue to monitor closely.

## 2014-12-25 NOTE — Progress Notes (Signed)
ANTICOAGULATION CONSULT NOTE - Follow Up Consult  Pharmacy Consult for Heparin Indication: atrial fibrillation  No Known Allergies  Patient Measurements: Height: 5\' 6"  (167.6 cm) Weight: 228 lb 11.2 oz (103.738 kg) IBW/kg (Calculated) : 63.8 Heparin Dosing Weight: 86 kg  Vital Signs: Temp: 98.7 F (37.1 C) (05/16 0855) Temp Source: Oral (05/16 0855) BP: 129/68 mmHg (05/16 0952) Pulse Rate: 72 (05/16 0855)  Labs:  Recent Labs  12/23/14 2150  12/24/14 0319 12/24/14 0719 12/24/14 1100 12/24/14 1256 12/24/14 1524 12/24/14 2131 12/25/14 0350  HGB 14.7  --  14.2  --   --   --   --   --  13.1  HCT 46.5  --  43.7  --   --   --   --   --  41.1  PLT 178  --  187  --   --   --   --   --  217  HEPARINUNFRC  --   --   --  0.65  --  0.66  --   --  0.51  CREATININE 1.32*  --   --   --  1.42*  --   --   --  1.51*  TROPONINI 0.06*  < > 0.04*  --  0.06*  --  0.04* 0.04* 0.06*  < > = values in this interval not displayed.  Estimated Creatinine Clearance: 59.5 mL/min (by C-G formula based on Cr of 1.51).   Assessment: 59 y/o M from [place] presents with with chest pain, also has hx of afib with previous anticoagulation (but not taking now per MD due to anemia of unknown source)  PMH: IDDM, HTN, atrial fibrillation s/p ablation, CAD s/p MI 02/2014, HTN   Anticoagulation: IV heparin for r/o ACS, also has afib (chadsvasc 3) but not on AC due to hx anemia. Heparin level 0.51 in goal. Hgb 13.1 (baseline 14.6)  Cardiovascular: CAD, HTN, HLD, admit w/ CP, troponins negative but T wave changes on EKG, cards consulted - MI vs pericardial effusion vs pericarditis - ECHO EF 50-55% Meds: Aspirin325, lipitor40 (LDL 55), coreg, diltiazem, po lasix, lisinopril  Endocrinology: CBGs [id number]- SSI and levemir80/hs  Gastrointestinal / Nutrition: Pepcid and Protonix?  Neurology: gabapentin  Nephrology: Scr 1.51. R renal lesion. Lytes ok. Low UOP  Pulmonary: RA  Hematology / Oncology: h/o anemia but  CBC currently WNL.  PTA Medication Issues: januvia, apidra  Best Practices: IV heparin, PPI   Goal of Therapy:  Heparin level 0.3-0.7 units/ml Monitor platelets by anticoagulation protocol: Yes   Plan:  Continue IV heparin at 1200 units/hr Daily HL and CBC  Lynett Brasil S. Alford Highland, PharmD, BCPS Clinical Staff Pharmacist Pager [phone]  Eilene Ghazi Stillinger 12/25/2014,10:21 AM

## 2014-12-25 NOTE — Progress Notes (Signed)
[facility] PROGRESS NOTE  Marquee Fuchs QHU:[id number] DOB: Aug 08, 1956 DOA: 12/23/2014 PCP: Pcp Not In System  Assessment/Plan: 1. Chest pain -Patient with established history of coronary artery disease having a myocardial infarction in 2015, recently moved to the area from [place] where he has received most of his care. Medical records are pending. -He remains on IV heparin along with aspirin, statin, beta blocker and ACE inhibitor. -Troponins remained stable overnight. Check stat x-ray showed no acute changes.  -Currently reporting that chest pain has eased off. -Pending transthoracic echocardiogram -Cardiology following, considering cardiac catheterization  2. Acute Kidney Injury -Patient's creatinine increasing to 1.51 from 1.2. Could be related to contrast nephropathy or perhaps related to dehydration from diuretic therapy.  -His lasix was held today as he was hydrated with 500 mL bolus of NS followed by maintenance fluids running at 75 mL/hour. -ACE held  -Repeat labs in am  3.  Hypertension. -Blood pressures remain stable, will hold lisinopril due to increasing creatinine  3.  Insulin-dependent diabetes mellitus -Patient's blood sugars remain elevated fluctuating in the 200-300 range -Will increase Levemir to 85 units subcutaneous at bedtime and increase his meal time novolog to 15 units  4.  Dyslipidemia -Continue Lipitor 40 mg by mouth daily  5.  Right renal lesion. -Patient having a 2 cm indeterminate right upper renal lesion L reported on CT scan of lungs.  -This was further worked up with renal u/s that did not show solid lesion or hydronephrosis  Code Status: Full code Family Communication:  Disposition Plan: Anticipate discharge home when medically stable   Consultants:  Cardiology   HPI/Subjective: Patient is a pleasant 59 year old with a past medical history of coronary artery disease status post myocardial infarction in 2015, atrial fibrillation,  presented to the emergency department on 12/23/2014 with complaints of chest pain having atypical features. Initial workup included a two-view chest x-ray did not reveal acute cardiopulmonary disease. Troponins were cycled and remained stable overnight. Cardiology was consulted, pending transthoracic echocardiogram, considering cardiac catheterization.  Objective: Filed Vitals:   12/25/14 1220  BP: 129/83  Pulse: 77  Temp: 98.7 F (37.1 C)  Resp: 16    Intake/Output Summary (Last 24 hours) at 12/25/14 1604 Last data filed at 12/25/14 0521  Gross per 24 hour  Intake    240 ml  Output    550 ml  Net   -310 ml   Filed Weights   12/23/14 2233 12/25/14 0400  Weight: 102.4 kg (225 lb 12 oz) 103.738 kg (228 lb 11.2 oz)    Exam:   General:  Patient reporting chest pain has "eased off" he is awake and alert oriented, states tolerating by mouth intake  Cardiovascular: Regular rate and rhythm normal S1-S2 no murmurs rubs or gallops  Respiratory: Normal respiratory effort, lungs are clear  Abdomen: Soft nontender nondistended  Musculoskeletal: Trace edema to lower extremities bilaterally  Data Reviewed: Basic Metabolic Panel:  Recent Labs Lab 12/23/14 1517 12/23/14 2150 12/24/14 1100 12/25/14 0350  NA 139  --  138 138  K 3.5  --  3.6 4.7  CL 102  --  100* 103  CO2 25  --  27 24  GLUCOSE 175*  --  297* 346*  BUN 8  --  14 16  CREATININE 1.20 1.32* 1.42* 1.51*  CALCIUM 8.9  --  8.6* 8.5*   Liver Function Tests: No results for input(s): AST, ALT, ALKPHOS, BILITOT, PROT, ALBUMIN in the last 168 hours. No results for input(s): LIPASE,  AMYLASE in the last 168 hours. No results for input(s): AMMONIA in the last 168 hours. CBC:  Recent Labs Lab 12/23/14 1517 12/23/14 2150 12/24/14 0319 12/25/14 0350  WBC 9.5 10.6* 11.3* 9.7  HGB 14.6 14.7 14.2 13.1  HCT 46.1 46.5 43.7 41.1  MCV 95.1 96.3 95.2 97.9  PLT 186 178 187 217   Cardiac Enzymes:  Recent Labs Lab  12/24/14 1100 12/24/14 1524 12/24/14 2131 12/25/14 0350 12/25/14 0956  TROPONINI 0.06* 0.04* 0.04* 0.06* 0.04*   BNP (last 3 results)  Recent Labs  12/23/14 1517  BNP 76.5    ProBNP (last 3 results) No results for input(s): PROBNP in the last 8760 hours.  CBG:  Recent Labs Lab 12/24/14 1126 12/24/14 1632 12/24/14 2118 12/25/14 0723 12/25/14 1124  GLUCAP 277* 299* 349* 236* 223*    Recent Results (from the past 240 hour(s))  MRSA PCR Screening     Status: None   Collection Time: 12/24/14 12:15 AM  Result Value Ref Range Status   MRSA by PCR NEGATIVE NEGATIVE Final    Comment:        The GeneXpert MRSA Assay (FDA approved for NASAL specimens only), is one component of a comprehensive MRSA colonization surveillance program. It is not intended to diagnose MRSA infection nor to guide or monitor treatment for MRSA infections.      Studies: Ct Angio Chest Pe W/cm &/or Wo Cm  12/23/2014   CLINICAL DATA:  59 year old male with chest pain and shortness of breath for 1 day.  EXAM: CT ANGIOGRAPHY CHEST WITH CONTRAST  TECHNIQUE: Multidetector CT imaging of the chest was performed using the standard protocol during bolus administration of intravenous contrast. Multiplanar CT image reconstructions and MIPs were obtained to evaluate the vascular anatomy.  CONTRAST:  75mL OMNIPAQUE IOHEXOL 350 MG/ML SOLN  COMPARISON:  12/23/2014 chest radiograph  FINDINGS: This is a technically satisfactory study.  Mediastinum/Nodes: No pulmonary emboli are identified. There is no evidence of thoracic aortic aneurysm. Cardiomegaly is identified. Mild mild-moderate coronary artery calcifications are present. No enlarged lymph nodes. No pericardial effusion.  Lungs/Pleura: No pleural effusion identified. Mild dependent and bibasilar atelectasis noted. There is no evidence of airspace disease, consolidation, nodule or mass. No endobronchial or endotracheal lesions identified.  Upper abdomen: Hepatic  steatosis and nonobstructing left upper pole renal calculi noted. A 2 cm indeterminate right upper renal lesion identified.  Musculoskeletal: No acute or suspicious bony abnormalities identified.  Review of the MIP images confirms the above findings.  IMPRESSION: No evidence of pulmonary emboli or thoracic aortic aneurysm.  Cardiomegaly and coronary artery disease.  Mild dependent and bibasilar atelectasis.  2 cm indeterminate right upper renal lesion. Consider further evaluation first with ultrasound if no prior outside studies are available for comparison.  Hepatic steatosis and nonobstructing left upper pole renal calculi.   Electronically Signed   By: Margarette Canada M.D.   On: 12/23/2014 18:50   US Renal  12/24/2014   CLINICAL DATA:  59 year old male with indeterminate right renal lesion on chest CT.  EXAM: RENAL / URINARY TRACT ULTRASOUND COMPLETE  COMPARISON:  12/23/2014 chest CT  FINDINGS: Right Kidney:  Length: 13.5 cm. Increased renal echogenicity noted with moderate cortical thinning. A 2.5 x 2 x 2.1 cm upper pole cyst is noted. No definite solid mass or hydronephrosis identified.  Left Kidney:  Length: 13.1 cm. Increased renal echogenicity noted with moderate cortical thinning. A 2.3 x 1.5 x 2.2 cm lower pole cyst is present. No definite solid  mass or hydronephrosis identified.  Bladder:  Appears normal for degree of bladder distention.  IMPRESSION: Bilateral renal cysts.  Increased renal echogenicity with moderate cortical thinning compatible with medical renal disease. No evidence of hydronephrosis.   Electronically Signed   By: Margarette Canada M.D.   On: 12/24/2014 18:23    Scheduled Meds: . aspirin EC  325 mg Oral Daily  . atorvastatin  40 mg Oral Daily  . carvedilol  25 mg Oral BID WC  . Chlorhexidine Gluconate Cloth  6 each Topical Q0600  . diltiazem  180 mg Oral Daily  . famotidine  20 mg Oral Daily  . gabapentin  600 mg Oral TID  . insulin aspart  0-9 Units Subcutaneous TID WC & HS  .  insulin aspart  15 Units Subcutaneous TID WC  . insulin detemir  85 Units Subcutaneous QHS  . pantoprazole  40 mg Oral BID   Continuous Infusions:    Principal Problem:   Chest pain Active Problems:   Elevated troponin   CAD (coronary artery disease)   PAF (paroxysmal atrial fibrillation)   Essential hypertension   HLD (hyperlipidemia)   AKI (acute kidney injury)   PUD (peptic ulcer disease)   DM2 (diabetes mellitus, type 2)    Time spent: 40 min    Kelvin Cellar  [facility] Pager [phone]. If 7PM-7AM, please contact night-coverage at [email or web address], password [facility] 12/25/2014, 4:04 PM  LOS: 2 days

## 2014-12-26 DIAGNOSIS — R079 Chest pain, unspecified: Secondary | ICD-10-CM

## 2014-12-26 DIAGNOSIS — N179 Acute kidney failure, unspecified: Secondary | ICD-10-CM

## 2014-12-26 LAB — TROPONIN I
TROPONIN I: 0.04 ng/mL — AB (ref <0.031)
TROPONIN I: 0.04 ng/mL — AB (ref <0.031)
Troponin I: 0.04 ng/mL — ABNORMAL HIGH (ref <0.031)
Troponin I: 0.05 ng/mL — ABNORMAL HIGH (ref <0.031)

## 2014-12-26 LAB — GLUCOSE, CAPILLARY
GLUCOSE-CAPILLARY: 126 mg/dL — AB (ref 65–99)
GLUCOSE-CAPILLARY: 164 mg/dL — AB (ref 65–99)
GLUCOSE-CAPILLARY: 130 mg/dL — AB (ref 65–99)
Glucose-Capillary: 211 mg/dL — ABNORMAL HIGH (ref 65–99)

## 2014-12-26 LAB — BASIC METABOLIC PANEL
ANION GAP: 10 (ref 5–15)
BUN: 10 mg/dL (ref 6–20)
CALCIUM: 8.3 mg/dL — AB (ref 8.9–10.3)
CO2: 26 mmol/L (ref 22–32)
Chloride: 105 mmol/L (ref 101–111)
Creatinine, Ser: 1.3 mg/dL — ABNORMAL HIGH (ref 0.61–1.24)
GFR, EST NON AFRICAN AMERICAN: 59 mL/min — AB (ref >60)
Glucose, Bld: 239 mg/dL — ABNORMAL HIGH (ref 65–99)
Potassium: 3.5 mmol/L (ref 3.5–5.1)
SODIUM: 141 mmol/L (ref 135–145)

## 2014-12-26 MED ORDER — PREDNISONE 10 MG PO TABS
10.0000 mg | ORAL_TABLET | Freq: Two times a day (BID) | ORAL | Status: DC
  Administered 2014-12-26 – 2014-12-27 (×2): 10 mg via ORAL
  Filled 2014-12-26 (×2): qty 1

## 2014-12-26 NOTE — Progress Notes (Signed)
Medical record ( stress test and heart cath result from 2015 ) from [place] was received today via fax and are in the patient's chart.  Ferdinand Lango, RN

## 2014-12-26 NOTE — Progress Notes (Signed)
Patient Name: Marco [name] Date of Encounter: 12/26/2014  Primary Cardiologist: previously in [place] before moving here   Principal Problem:   Chest pain Active Problems:   Elevated troponin   CAD (coronary artery disease)   PAF (paroxysmal atrial fibrillation)   Essential hypertension   HLD (hyperlipidemia)   AKI (acute kidney injury)   PUD (peptic ulcer disease)   DM2 (diabetes mellitus, type 2)    SUBJECTIVE  Still awaiting outside records.  Pt states he had occasional chest pain through yesterday, but nothing bad enough to require medicine. States he usually tries not to take anything unless the pain is really bad. Had a BM yesterday and says his stomach feels a lot better.    CURRENT MEDS . aspirin EC  325 mg Oral Daily  . atorvastatin  40 mg Oral Daily  . carvedilol  25 mg Oral BID WC  . Chlorhexidine Gluconate Cloth  6 each Topical Q0600  . diltiazem  180 mg Oral Daily  . famotidine  20 mg Oral Daily  . gabapentin  600 mg Oral TID  . insulin aspart  0-9 Units Subcutaneous TID WC & HS  . insulin aspart  15 Units Subcutaneous TID WC  . insulin detemir  85 Units Subcutaneous QHS  . pantoprazole  40 mg Oral BID  . predniSONE  10 mg Oral BID WC   OBJECTIVE  Filed Vitals:   12/25/14 2045 12/26/14 0000 12/26/14 0400 12/26/14 0727  BP: 127/70 136/78 136/77 144/81  Pulse: 77  81 73  Temp: 99.3 F (37.4 C) 100 F (37.8 C) 99.8 F (37.7 C) 99.2 F (37.3 C)  TempSrc: Oral Oral Oral Oral  Resp: _0 Height:      Weight:   228 lb 6.3 oz (103.6 kg)   SpO2: 96% 93% 94% 95%    Intake/Output Summary (Last 24 hours) at 12/26/14 1051 Last data filed at 12/26/14 0906  Gross per 24 hour  Intake    480 ml  Output    400 ml  Net     80 ml   Filed Weights   12/23/14 2233 12/25/14 0400 12/26/14 0400  Weight: 225 lb 12 oz (102.4 kg) 228 lb 11.2 oz (103.738 kg) 228 lb 6.3 oz (103.6 kg)   PHYSICAL EXAM  General: Pleasant, NAD. Neuro: Alert and oriented X 3. Moves  all extremities spontaneously. Psych: Flat affect. HEENT:  Normal  Neck: Supple without bruits or JVD. Lungs:  Resp regular and unlabored, CTA. Heart: RRR no s3, s4, or murmurs. Abdomen: Soft, non-distended, BS + x 4. Extremities: No clubbing, cyanosis or edema. DP/PT/Radials 2+ and equal bilaterally.  Still flinches at lightest contact of arms, legs, and abdomen. Yet moves around the bed and sits up without difficulty.  Accessory Clinical Findings  CBC  Recent Labs  12/24/14 0319 12/25/14 0350  WBC 11.3* 9.7  HGB 14.2 13.1  HCT 43.7 41.1  MCV 95.2 97.9  PLT 187 290   Basic Metabolic Panel  Recent Labs  12/25/14 0350 12/26/14 0253  NA 138 141  K 4.7 3.5  CL 103 105  CO2 24 26  GLUCOSE 346* 239*  BUN 16 10  CREATININE 1.51* 1.30*  CALCIUM 8.5* 8.3*   Cardiac Enzymes  Recent Labs  12/25/14 1707 12/25/14 2208 12/26/14 0253  TROPONINI 0.04* 0.03 0.04*   D-Dimer  Recent Labs  12/23/14 1603  DDIMER 1.11*   Fasting Lipid Panel  Recent Labs  12/23/14 2149  CHOL  127  HDL 34*  LDLCALC 55  TRIG 192*  CHOLHDL 3.7   Thyroid Function Tests  Recent Labs  12/23/14 2150  TSH 0.757    TELE 5 beats of NSVT followed by 7 beats last night.  ECG NSR @ 80 bpm. 12/24/14  Radiology/Studies  Dg Chest 2 View  12/23/2014   CLINICAL DATA:  Mid chest pain beginning yesterday.  EXAM: CHEST  2 VIEW  COMPARISON:  None.  FINDINGS: Lungs are adequately inflated without consolidation or effusion. Cardiomediastinal silhouette is within normal. Mild degenerative change of the spine with prominent osteophytes over the mid to lower thoracic spine.  IMPRESSION: No active cardiopulmonary disease.     Ct Angio Chest Pe W/cm &/or Wo Cm  12/23/2014   CLINICAL DATA:  59-year-old male with chest pain and shortness of breath for 1 day.  IMPRESSION: No evidence of pulmonary emboli or thoracic aortic aneurysm.  Cardiomegaly and coronary artery disease.  Mild dependent and  bibasilar atelectasis.  2 cm indeterminate right upper renal lesion. Consider further evaluation first with ultrasound if no prior outside studies are available for comparison.  Hepatic steatosis and nonobstructing left upper pole renal calculi.    Echo 12/24/14 Study Conclusions  - Left ventricle: The cavity size was normal. Wall thickness was increased in a pattern of moderate LVH. Systolic function was normal. The estimated ejection fraction was in the range of 50% to 55%. Wall motion was normal; there were no regional wall motion abnormalities. Doppler parameters are consistent with abnormal left ventricular relaxation (grade 1 diastolic dysfunction). - Pericardium: There was no pericardial effusion.    ASSESSMENT AND PLAN  1. Chest pain  - Atypical presentation for ischemia  - Similar to previous angina  - Reports normal stress in past several months. Record requested.    - Reportedly had normal cath several month ago at [facility] in [place]  - trop has no clear trend, mildly elevated but flat.   - + D-dimer, Chest CT negative for PE. CRP negative  - Echo shows EF of 50-55%, grade 1 diastolic dysfunction. Result reassuring.   - characteristics of CP certainly atypical and worse with deep palpation and deep inspiration. If out record is negative, unlikely to do any further cardiac testing.   -Considered pericarditis as a differential diagnosis, but pt unable to tolerate MRI machine.   - called his previous cardiologist, will receive record within the next hour, then will make decision   2.CAD (coronary artery disease)    -Recent work-up as listed in #1.  -Cont heparin, Nitro, ASA, statin, BB, CCB, ACEI.  3. PAF (paroxysmal atrial fibrillation)    -Rate controlled. No afib noted thus far during admission.  4. Essential hypertension    -Cont carvedilol, diltiazem, lasix, and lisinopril   5. Hyperlipidemia  -Continue statin   6. AKI (acute  kidney injury)  - Increased Cr likely related to dye from Chest Ct.   - Lasix held yesterday  - Starting to trend down.    - Consider fluids    7. PUD (peptic ulcer disease)    - Continue PPI  8. DM2 (diabetes mellitus, type 2)  - Levemir with mealtime coverage per IM.  Signed, Hao Meng PA 12/26/2014 Pager: [phone]  The patient was seen, examined and discussed with Hao Meng, [name] and I agree with the above.   58 year old male with atypical chest pain, h/o CAD, MI in 02/2014, recently moved here from [place] where he   had his prior work up done, we are awaiting records from recent stress test and cath - both presumably normal.  The symptoms are atypical, related to position change and deep inspiration.  ESR is mildly elevated. Mild troponin elevation is flat, minimally elevated most probably sec to acute on CKD, he is on iv Heparin, on and off chest pain now. We will review his records and decide about further testing, most probably none.   The patient didn't tolerate cardiac MRI due to claustrophobia.  His crea improved now.I would continue holding lasix.  BP is controlled.  If stress test and cath normal we will start a trial of empiric pericarditis treatment.   Dorothy Spark 12/26/2014

## 2014-12-26 NOTE — Progress Notes (Signed)
[facility] PROGRESS NOTE  Rodderick Holtzer QTM:[id number] DOB: 01/03/1956 DOA: 12/23/2014 PCP: Pcp Not In System  Assessment/Plan: 59 y/o male with PMH of DM, HTN, RA (on prednisone), h/o CAD, A fib (off xarelto due to GIB), h/o PUD presented with chest pains  1. Chest pains of unclear etiology. CTA: no PE. Mild borderline +trop ? Pericarditis per cardiology eval. Echo: LVEF 50-55%. Wall motion was normal -on ASA, BB, NTG. Off IV heparin. defer testing to cardiology, appreciate the input   2. IDDM. Cont insulin regimen.  3. RA prednisone dependant. Will resume prednisone. ? RA related body pains. D/w patient, recommenced to f/u rheumatologist to consider taper off prednisone, evaluate for new agents   4. h/o PUD. Patient had two EGD in the last 6 month. (he reports no acute bleeding, unremarkable last EGD-records not available). ? Prednisone related PUD. Will cont PPI, recommended to f/ui with GI as outpatient  5. AKI-contrast exposure. Renal funcion improving on IVF. Hold ACE   Code Status: full Family Communication: d/w patient (indicate person spoken with, relationship, and if by phone, the number) Disposition Plan: pend cardiology work up    Consultants:  Cardiology   Procedures:  echo  Antibiotics:   none (indicate start date, and stop date if known)  HPI/Subjective: alert  Objective: Filed Vitals:   12/26/14 0727  BP: 144/81  Pulse: 73  Temp: 99.2 F (37.3 C)  Resp: 16    Intake/Output Summary (Last 24 hours) at 12/26/14 1012 Last data filed at 12/26/14 0906  Gross per 24 hour  Intake    480 ml  Output    400 ml  Net     80 ml   Filed Weights   12/23/14 2233 12/25/14 0400 12/26/14 0400  Weight: 102.4 kg (225 lb 12 oz) 103.738 kg (228 lb 11.2 oz) 103.6 kg (228 lb 6.3 oz)    Exam:   General:  Alert, no distress   Cardiovascular: s1,s2 rrr  Respiratory: CTA BL  Abdomen: soft, nt,nd   Musculoskeletal: no leg edema   Data Reviewed: Basic  Metabolic Panel:  Recent Labs Lab 12/23/14 1517 12/23/14 2150 12/24/14 1100 12/25/14 0350 12/26/14 0253  NA 139  --  138 138 141  K 3.5  --  3.6 4.7 3.5  CL 102  --  100* 103 105  CO2 25  --  27 24 26   GLUCOSE 175*  --  297* 346* 239*  BUN 8  --  14 16 10   CREATININE 1.20 1.32* 1.42* 1.51* 1.30*  CALCIUM 8.9  --  8.6* 8.5* 8.3*   Liver Function Tests: No results for input(s): AST, ALT, ALKPHOS, BILITOT, PROT, ALBUMIN in the last 168 hours. No results for input(s): LIPASE, AMYLASE in the last 168 hours. No results for input(s): AMMONIA in the last 168 hours. CBC:  Recent Labs Lab 12/23/14 1517 12/23/14 2150 12/24/14 0319 12/25/14 0350  WBC 9.5 10.6* 11.3* 9.7  HGB 14.6 14.7 14.2 13.1  HCT 46.1 46.5 43.7 41.1  MCV 95.1 96.3 95.2 97.9  PLT 186 178 187 217   Cardiac Enzymes:  Recent Labs Lab 12/25/14 0350 12/25/14 0956 12/25/14 1707 12/25/14 2208 12/26/14 0253  TROPONINI 0.06* 0.04* 0.04* 0.03 0.04*   BNP (last 3 results)  Recent Labs  12/23/14 1517  BNP 76.5    ProBNP (last 3 results) No results for input(s): PROBNP in the last 8760 hours.  CBG:  Recent Labs Lab 12/25/14 0723 12/25/14 1124 12/25/14 1654 12/25/14 2124 12/26/14 0725  GLUCAP 236* 223* 154* 196* 126*    Recent Results (from the past 240 hour(s))  MRSA PCR Screening     Status: None   Collection Time: 12/24/14 12:15 AM  Result Value Ref Range Status   MRSA by PCR NEGATIVE NEGATIVE Final    Comment:        The GeneXpert MRSA Assay (FDA approved for NASAL specimens only), is one component of a comprehensive MRSA colonization surveillance program. It is not intended to diagnose MRSA infection nor to guide or monitor treatment for MRSA infections.      Studies: US Renal  12/24/2014   CLINICAL DATA:  59 year old male with indeterminate right renal lesion on chest CT.  EXAM: RENAL / URINARY TRACT ULTRASOUND COMPLETE  COMPARISON:  12/23/2014 chest CT  FINDINGS: Right Kidney:   Length: 13.5 cm. Increased renal echogenicity noted with moderate cortical thinning. A 2.5 x 2 x 2.1 cm upper pole cyst is noted. No definite solid mass or hydronephrosis identified.  Left Kidney:  Length: 13.1 cm. Increased renal echogenicity noted with moderate cortical thinning. A 2.3 x 1.5 x 2.2 cm lower pole cyst is present. No definite solid mass or hydronephrosis identified.  Bladder:  Appears normal for degree of bladder distention.  IMPRESSION: Bilateral renal cysts.  Increased renal echogenicity with moderate cortical thinning compatible with medical renal disease. No evidence of hydronephrosis.   Electronically Signed   By: Margarette Canada M.D.   On: 12/24/2014 18:23    Scheduled Meds: . aspirin EC  325 mg Oral Daily  . atorvastatin  40 mg Oral Daily  . carvedilol  25 mg Oral BID WC  . Chlorhexidine Gluconate Cloth  6 each Topical Q0600  . diltiazem  180 mg Oral Daily  . famotidine  20 mg Oral Daily  . gabapentin  600 mg Oral TID  . insulin aspart  0-9 Units Subcutaneous TID WC & HS  . insulin aspart  15 Units Subcutaneous TID WC  . insulin detemir  85 Units Subcutaneous QHS  . pantoprazole  40 mg Oral BID   Continuous Infusions:   Principal Problem:   Chest pain Active Problems:   Elevated troponin   CAD (coronary artery disease)   PAF (paroxysmal atrial fibrillation)   Essential hypertension   HLD (hyperlipidemia)   AKI (acute kidney injury)   PUD (peptic ulcer disease)   DM2 (diabetes mellitus, type 2)    Time spent: >35 minutes     Kinnie Feil  [facility] Pager [phone]. If 7PM-7AM, please contact night-coverage at [email or web address], password [facility] 12/26/2014, 10:12 AM  LOS: 3 days

## 2014-12-26 NOTE — Progress Notes (Deleted)
Patient Name: Marco [name] Date of Encounter: 12/26/2014  Primary Cardiologist: previously in [place] before moving here   Principal Problem:   Chest pain Active Problems:   Elevated troponin   CAD (coronary artery disease)   PAF (paroxysmal atrial fibrillation)   Essential hypertension   HLD (hyperlipidemia)   AKI (acute kidney injury)   PUD (peptic ulcer disease)   DM2 (diabetes mellitus, type 2)    SUBJECTIVE  Still awaiting outside records.  Pt states he had occasional chest pain through yesterday, but nothing bad enough to require medicine. States he usually tries not to take anything unless the pain is really bad. Had a BM yesterday and says his stomach feels a lot better.    CURRENT MEDS . aspirin EC  325 mg Oral Daily  . atorvastatin  40 mg Oral Daily  . carvedilol  25 mg Oral BID WC  . Chlorhexidine Gluconate Cloth  6 each Topical Q0600  . diltiazem  180 mg Oral Daily  . famotidine  20 mg Oral Daily  . gabapentin  600 mg Oral TID  . insulin aspart  0-9 Units Subcutaneous TID WC & HS  . insulin aspart  15 Units Subcutaneous TID WC  . insulin detemir  85 Units Subcutaneous QHS  . pantoprazole  40 mg Oral BID   OBJECTIVE  Filed Vitals:   12/25/14 2045 12/26/14 0000 12/26/14 0400 12/26/14 0727  BP: 127/70 136/78 136/77 144/81  Pulse: 77  81 73  Temp: 99.3 F (37.4 C) 100 F (37.8 C) 99.8 F (37.7 C) 99.2 F (37.3 C)  TempSrc: Oral Oral Oral Oral  Resp: 18 16 18 16   Height:      Weight:   228 lb 6.3 oz (103.6 kg)   SpO2: 96% 93% 94% 95%    Intake/Output Summary (Last 24 hours) at 12/26/14 0842 Last data filed at 12/25/14 2125  Gross per 24 hour  Intake    240 ml  Output    400 ml  Net   -160 ml   Filed Weights   12/23/14 2233 12/25/14 0400 12/26/14 0400  Weight: 225 lb 12 oz (102.4 kg) 228 lb 11.2 oz (103.738 kg) 228 lb 6.3 oz (103.6 kg)   PHYSICAL EXAM  General: Pleasant, NAD. Neuro: Alert and oriented X 3. Moves all extremities  spontaneously. Psych: Flat affect. HEENT:  Normal  Neck: Supple without bruits or JVD. Lungs:  Resp regular and unlabored, CTA. Heart: RRR no s3, s4, or murmurs. Abdomen: Soft, non-distended, BS + x 4. Extremities: No clubbing, cyanosis or edema. DP/PT/Radials 2+ and equal bilaterally.  Still flinches at lightest contact of arms, legs, and abdomen. Yet moves around the bed and sits up without difficulty.  Accessory Clinical Findings  CBC  Recent Labs  12/24/14 0319 12/25/14 0350  WBC 11.3* 9.7  HGB 14.2 13.1  HCT 43.7 41.1  MCV 95.2 97.9  PLT 187 914   Basic Metabolic Panel  Recent Labs  12/25/14 0350 12/26/14 0253  NA 138 141  K 4.7 3.5  CL 103 105  CO2 24 26  GLUCOSE 346* 239*  BUN 16 10  CREATININE 1.51* 1.30*  CALCIUM 8.5* 8.3*   Cardiac Enzymes  Recent Labs  12/25/14 1707 12/25/14 2208 12/26/14 0253  TROPONINI 0.04* 0.03 0.04*   D-Dimer  Recent Labs  12/23/14 1603  DDIMER 1.11*   Fasting Lipid Panel  Recent Labs  12/23/14 2149  CHOL 127  HDL 34*  LDLCALC 55  TRIG 192*  CHOLHDL 3.7   Thyroid Function Tests  Recent Labs  12/23/14 2150  TSH 0.757    TELE 5 beats of NSVT followed by 7 beats last night.  ECG NSR @ 80 bpm. 12/24/14  Radiology/Studies  Dg Chest 2 View  12/23/2014   CLINICAL DATA:  Mid chest pain beginning yesterday.  EXAM: CHEST  2 VIEW  COMPARISON:  None.  FINDINGS: Lungs are adequately inflated without consolidation or effusion. Cardiomediastinal silhouette is within normal. Mild degenerative change of the spine with prominent osteophytes over the mid to lower thoracic spine.  IMPRESSION: No active cardiopulmonary disease.     Ct Angio Chest Pe W/cm &/or Wo Cm  12/23/2014   CLINICAL DATA:  59 year old male with chest pain and shortness of breath for 1 day.  IMPRESSION: No evidence of pulmonary emboli or thoracic aortic aneurysm.  Cardiomegaly and coronary artery disease.  Mild dependent and bibasilar atelectasis.   2 cm indeterminate right upper renal lesion. Consider further evaluation first with ultrasound if no prior outside studies are available for comparison.  Hepatic steatosis and nonobstructing left upper pole renal calculi.    Echo 12/24/14 Study Conclusions  - Left ventricle: The cavity size was normal. Wall thickness was increased in a pattern of moderate LVH. Systolic function was normal. The estimated ejection fraction was in the range of 50% to 55%. Wall motion was normal; there were no regional wall motion abnormalities. Doppler parameters are consistent with abnormal left ventricular relaxation (grade 1 diastolic dysfunction). - Pericardium: There was no pericardial effusion.  ASSESSMENT AND PLAN  1. Chest pain  - Atypical presentation for ischemia  - Similar to previous angina  - Reports normal stress in past several months. Record requested.    - Reportedly had normal cath several month ago at [facility] in [place] has no clear trend, mildly elevated but flat.   - + D-dimer, Chest CT negative for PE. CRP negative  - Echo shows EF of 11-57%, grade 1 diastolic dysfunction. Result reassuring.   - characteristics of CP certainly atypical and worse with deep palpation and deep inspiration. If out record is negative, unlikely to do any further cardiac testing.   -Considered pericarditis as a differential diagnosis, but pt unable to tolerate MRI machine.   - called his previous cardiologist, will receive record within the next hour, then will make decision   2.CAD (coronary artery disease)    -Recent work-up as listed in #1.  -Cont heparin, Nitro, ASA, statin, BB, CCB, ACEI.  3. PAF (paroxysmal atrial fibrillation)    -Rate controlled. No afib noted thus far during admission.  4. Essential hypertension    -Cont carvedilol, diltiazem, lasix, and lisinopril   5. Hyperlipidemia  -Continue statin   6. AKI (acute kidney injury)  - Increased  Cr likely related to dye from Chest Ct.   - Lasix held yesterday  - Starting to trend down.    - Consider fluids    7. PUD (peptic ulcer disease)    - Continue PPI  8. DM2 (diabetes mellitus, type 2)  - Levemir with mealtime coverage per IM.  Hilbert Corrigan PA 12/26/2014 Pager: [phone]

## 2014-12-27 DIAGNOSIS — I309 Acute pericarditis, unspecified: Secondary | ICD-10-CM

## 2014-12-27 DIAGNOSIS — K279 Peptic ulcer, site unspecified, unspecified as acute or chronic, without hemorrhage or perforation: Secondary | ICD-10-CM

## 2014-12-27 LAB — GLUCOSE, CAPILLARY
GLUCOSE-CAPILLARY: 218 mg/dL — AB (ref 65–99)
Glucose-Capillary: 211 mg/dL — ABNORMAL HIGH (ref 65–99)

## 2014-12-27 LAB — TROPONIN I
TROPONIN I: 0.05 ng/mL — AB (ref <0.031)
Troponin I: 0.04 ng/mL — ABNORMAL HIGH (ref <0.031)

## 2014-12-27 LAB — HEMOGLOBIN A1C
Hgb A1c MFr Bld: 12.6 % — ABNORMAL HIGH (ref 4.8–5.6)
MEAN PLASMA GLUCOSE: 315 mg/dL

## 2014-12-27 MED ORDER — COLCHICINE 0.6 MG PO TABS: 0.6000 mg | ORAL_TABLET | Freq: Two times a day (BID) | ORAL | Status: DC

## 2014-12-27 MED ORDER — COLCHICINE 0.6 MG PO TABS
0.6000 mg | ORAL_TABLET | Freq: Two times a day (BID) | ORAL | Status: DC
  Administered 2014-12-27: 0.6 mg via ORAL
  Filled 2014-12-27: qty 1

## 2014-12-27 NOTE — Discharge Summary (Addendum)
Physician Discharge Summary  Srihith Aquilino FBP:[id number] DOB: [birth year] DOA: 12/23/2014  PCP: Pcp Not In System  Admit date: 12/23/2014 Discharge date: 12/27/2014  Time spent: >35 minutes  Recommendations for Outpatient Follow-up:  F/u with PCP in 3-7 days F/u with Cardiology in 2 weeks   Discharge Diagnoses:  Principal Problem:   Chest pain Active Problems:   Elevated troponin   CAD (coronary artery disease)   PAF (paroxysmal atrial fibrillation)   Essential hypertension   HLD (hyperlipidemia)   AKI (acute kidney injury)   PUD (peptic ulcer disease)   DM2 (diabetes mellitus, type 2)   Chest pain at rest   Discharge Condition: stable   Diet recommendation: low sodium, DM  Filed Weights   12/25/14 0400 12/26/14 0400 12/27/14 0336  Weight: 103.738 kg (228 lb 11.2 oz) 103.6 kg (228 lb 6.3 oz) 103.478 kg (228 lb 2 oz)    History of present illness:  59 y/o male with PMH of DM, HTN, h/o Cardiomyopathy, RA (on prednisone), h/o CAD, A fib (off xarelto due to GIB), h/o PUD presented with chest pains  Hospital Course:  1. Chest pains. Atypical. ? Pericarditis per cardiology evaluation. CTA: no PE. Echo: LVEF 50-55%. Wall motion was normal. LHC from [place] (2014) showed: mild non obstructive CAD -chest pains resolved. started on colchicine per cardiology recommendations. Cont ASA, BB.    2. IDDM. Cont insulin regimen.  3. RA prednisone dependant. D/w patient, recommenced to f/u rheumatologist to consider taper off prednisone, evaluate for new agents  4. h/o PUD. Patient had two EGD in the last 6 month. (he reports no acute bleeding, unremarkable last EGD-records not available). -no GI symptoms at this time. cont PPI, recommended to f/ui with GI as outpatient  5. Mild AKI-contrast exposure. Renal funcion improved on IVF. Resume home lisinopril. Recommended to recheck BMPin 3-7 days  6. H/o Cardiomyopathy. LVEF 40% in 2014. Currently clinically euvolemic. Echo: LVEF 50-55%.  Diastolic dysfunction. [name] Resume home ACE, lasix.    Procedures:  Echo: Study Conclusions  - Left ventricle: The cavity size was normal. Wall thickness was increased in a pattern of moderate LVH. Systolic function was normal. The estimated ejection fraction was in the range of 50% to 55%. Wall motion was normal; there were no regional wall motion abnormalities. Doppler parameters are consistent with abnormal left ventricular relaxation (grade 1 diastolic dysfunction).  (i.e. Studies not automatically included, echos, thoracentesis, etc; not x-rays)  Consultations:  Cardiology   Discharge Exam: Filed Vitals:   12/27/14 1123  BP: 179/95  Pulse: 73  Temp: 98.7 F (37.1 C)  Resp: 18    General: alert, oriented  Cardiovascular: s1,s2 rrr Respiratory: CTA BL  Discharge Instructions  Discharge Instructions    Diet - low sodium heart healthy    Complete by:  As directed      Discharge instructions    Complete by:  As directed   Please follow up with primary care. Cardiology in 2 weeks     Increase activity slowly    Complete by:  As directed             Medication List    TAKE these medications        aspirin EC 81 MG tablet  Take 81 mg by mouth daily.     atorvastatin 40 MG tablet  Commonly known as:  LIPITOR  Take 40 mg by mouth daily.     carvedilol 25 MG tablet  Commonly known as:  COREG  Take  25 mg by mouth 2 (two) times daily.     colchicine 0.6 MG tablet  Take 1 tablet (0.6 mg total) by mouth 2 (two) times daily.     diltiazem 180 MG 24 hr capsule  Commonly known as:  DILACOR XR  Take 180 mg by mouth daily.     furosemide 40 MG tablet  Commonly known as:  LASIX  Take 40 mg by mouth daily.     gabapentin 600 MG tablet  Commonly known as:  NEURONTIN  Take 600 mg by mouth 3 (three) times daily.     insulin detemir 100 UNIT/ML injection  Commonly known as:  LEVEMIR  Inject 80 Units into the skin at bedtime.     Insulin Glulisine  100 UNIT/ML Solostar Pen  Commonly known as:  APIDRA  Inject 16 Units into the skin 2 (two) times daily.     lisinopril 30 MG tablet  Commonly known as:  PRINIVIL,ZESTRIL  Take 30 mg by mouth daily.     pantoprazole 40 MG tablet  Commonly known as:  PROTONIX  Take 40 mg by mouth 2 (two) times daily.     predniSONE 10 MG tablet  Commonly known as:  DELTASONE  Take 10 mg by mouth 2 (two) times daily.     ranitidine 150 MG capsule  Commonly known as:  ZANTAC  Take 150 mg by mouth 2 (two) times daily.     REFRESH OP  Place 1 drop into both eyes 3 (three) times daily as needed (dry eyes).     sildenafil 100 MG tablet  Commonly known as:  VIAGRA  Take 100 mg by mouth daily as needed for erectile dysfunction.     sitaGLIPtin 100 MG tablet  Commonly known as:  JANUVIA  Take 100 mg by mouth daily.       No Known Allergies     Follow-up Information    Follow up with [facility] On 12/29/2014.   Why:  @ 9:00 am For Hospital Follow Up   Contact information:   [address] [place] [address] [phone]       The results of significant diagnostics from this hospitalization (including imaging, microbiology, ancillary and laboratory) are listed below for reference.    Significant Diagnostic Studies: Dg Chest 2 View  12/23/2014   CLINICAL DATA:  Mid chest pain beginning yesterday.  EXAM: CHEST  2 VIEW  COMPARISON:  None.  FINDINGS: Lungs are adequately inflated without consolidation or effusion. Cardiomediastinal silhouette is within normal. Mild degenerative change of the spine with prominent osteophytes over the mid to lower thoracic spine.  IMPRESSION: No active cardiopulmonary disease.   Electronically Signed   By: Marin Olp M.D.   On: 12/23/2014 16:11   Ct Angio Chest Pe W/cm &/or Wo Cm  12/23/2014   CLINICAL DATA:  59 year old male with chest pain and shortness of breath for 1 day.  EXAM: CT ANGIOGRAPHY CHEST WITH  CONTRAST  TECHNIQUE: Multidetector CT imaging of the chest was performed using the standard protocol during bolus administration of intravenous contrast. Multiplanar CT image reconstructions and MIPs were obtained to evaluate the vascular anatomy.  CONTRAST:  64mL OMNIPAQUE IOHEXOL 350 MG/ML SOLN  COMPARISON:  12/23/2014 chest radiograph  FINDINGS: This is a technically satisfactory study.  Mediastinum/Nodes: No pulmonary emboli are identified. There is no evidence of thoracic aortic aneurysm. Cardiomegaly is identified. Mild mild-moderate coronary artery calcifications are present. No enlarged lymph nodes. No pericardial  effusion.  Lungs/Pleura: No pleural effusion identified. Mild dependent and bibasilar atelectasis noted. There is no evidence of airspace disease, consolidation, nodule or mass. No endobronchial or endotracheal lesions identified.  Upper abdomen: Hepatic steatosis and nonobstructing left upper pole renal calculi noted. A 2 cm indeterminate right upper renal lesion identified.  Musculoskeletal: No acute or suspicious bony abnormalities identified.  Review of the MIP images confirms the above findings.  IMPRESSION: No evidence of pulmonary emboli or thoracic aortic aneurysm.  Cardiomegaly and coronary artery disease.  Mild dependent and bibasilar atelectasis.  2 cm indeterminate right upper renal lesion. Consider further evaluation first with ultrasound if no prior outside studies are available for comparison.  Hepatic steatosis and nonobstructing left upper pole renal calculi.   Electronically Signed   By: Margarette Canada M.D.   On: 12/23/2014 18:50   US Renal  12/24/2014   CLINICAL DATA:  59 year old male with indeterminate right renal lesion on chest CT.  EXAM: RENAL / URINARY TRACT ULTRASOUND COMPLETE  COMPARISON:  12/23/2014 chest CT  FINDINGS: Right Kidney:  Length: 13.5 cm. Increased renal echogenicity noted with moderate cortical thinning. A 2.5 x 2 x 2.1 cm upper pole cyst is noted. No  definite solid mass or hydronephrosis identified.  Left Kidney:  Length: 13.1 cm. Increased renal echogenicity noted with moderate cortical thinning. A 2.3 x 1.5 x 2.2 cm lower pole cyst is present. No definite solid mass or hydronephrosis identified.  Bladder:  Appears normal for degree of bladder distention.  IMPRESSION: Bilateral renal cysts.  Increased renal echogenicity with moderate cortical thinning compatible with medical renal disease. No evidence of hydronephrosis.   Electronically Signed   By: Margarette Canada M.D.   On: 12/24/2014 18:23    Microbiology: Recent Results (from the past 240 hour(s))  MRSA PCR Screening     Status: None   Collection Time: 12/24/14 12:15 AM  Result Value Ref Range Status   MRSA by PCR NEGATIVE NEGATIVE Final    Comment:        The GeneXpert MRSA Assay (FDA approved for NASAL specimens only), is one component of a comprehensive MRSA colonization surveillance program. It is not intended to diagnose MRSA infection nor to guide or monitor treatment for MRSA infections.      Labs: Basic Metabolic Panel:  Recent Labs Lab 12/23/14 1517 12/23/14 2150 12/24/14 1100 12/25/14 0350 12/26/14 0253  NA 139  --  138 138 141  K 3.5  --  3.6 4.7 3.5  CL 102  --  100* 103 105  CO2 25  --  27 24 26   GLUCOSE 175*  --  297* 346* 239*  BUN 8  --  14 16 10   CREATININE 1.20 1.32* 1.42* 1.51* 1.30*  CALCIUM 8.9  --  8.6* 8.5* 8.3*   Liver Function Tests: No results for input(s): AST, ALT, ALKPHOS, BILITOT, PROT, ALBUMIN in the last 168 hours. No results for input(s): LIPASE, AMYLASE in the last 168 hours. No results for input(s): AMMONIA in the last 168 hours. CBC:  Recent Labs Lab 12/23/14 1517 12/23/14 2150 12/24/14 0319 12/25/14 0350  WBC 9.5 10.6* 11.3* 9.7  HGB 14.6 14.7 14.2 13.1  HCT 46.1 46.5 43.7 41.1  MCV 95.1 96.3 95.2 97.9  PLT 186 178 187 217   Cardiac Enzymes:  Recent Labs Lab 12/26/14 1030 12/26/14 1551 12/26/14 2200  12/27/14 0342 12/27/14 1039  TROPONINI 0.04* 0.05* 0.04* 0.04* 0.05*   BNP: BNP (last 3 results)  Recent Labs  12/23/14 1517  BNP 76.5    ProBNP (last 3 results) No results for input(s): PROBNP in the last 8760 hours.  CBG:  Recent Labs Lab 12/26/14 1200 12/26/14 1630 12/26/14 2038 12/27/14 0727 12/27/14 1119  GLUCAP 164* 130* 211* 211* 218*       Signed:  Bryn Saline N  [facility] 12/27/2014, 12:26 PM    Addendum/clerification: chest pain likely due to pericarditis  Kaymarie Wynn N 01/06/2015

## 2014-12-27 NOTE — Progress Notes (Signed)
Inpatient Diabetes Program Recommendations  AACE/ADA: New Consensus Statement on Inpatient Glycemic Control (2013)  Target Ranges:  Prepandial:   less than 140 mg/dL      Peak postprandial:   less than 180 mg/dL (1-2 hours)      Critically ill patients:  140 - 180 mg/dL   Results for Marco [name], Marco [name] (MRN [id number]) as of 12/27/2014 10:03  Ref. Range 12/26/2014 07:25 12/26/2014 12:00 12/26/2014 16:30 12/26/2014 20:38 12/27/2014 07:27  Glucose-Capillary Latest Ref Range: 65-99 mg/dL 126 (H) 164 (H) 130 (H) 211 (H) 211 (H)   Reason for Visit: CP  Diabetes history: DM 2 Outpatient Diabetes medications: Levemir 80 units QHS, Apidra 16 units BID, Januvia 100 mg Daily Current orders for Inpatient glycemic control: Levemir 85 units QHS, Novolog 0-9 units TID, Novolog 15 units TID meal coverage  Inpatient Diabetes Program Recommendations  Correction (SSI): Glucose was 211 mg/dl last pm. Please consider Novolog 0-5 units QHS for bedtime coverage. Glucose this am should come down with the meal coverage no basal insulin change at this time.  Thanks,  Tama Headings RN, MSN, [facility] Inpatient Diabetes Coordinator Team Pager [phone]

## 2014-12-27 NOTE — Progress Notes (Signed)
Patient Name: Marco [name] Date of Encounter: 12/27/2014  Primary Cardiologist: new - Dr. Meda Coffee   Principal Problem:   Chest pain Active Problems:   Elevated troponin   CAD (coronary artery disease)   PAF (paroxysmal atrial fibrillation)   Essential hypertension   HLD (hyperlipidemia)   AKI (acute kidney injury)   PUD (peptic ulcer disease)   DM2 (diabetes mellitus, type 2)   Chest pain at rest    SUBJECTIVE  Feeling well. States CP is a 1-2/10 this morning.  Says he gets slightly short of breath when he is up moving around.  Wants to Eat. Says he still has not had a satisfactory BM.  CURRENT MEDS . aspirin EC  325 mg Oral Daily  . atorvastatin  40 mg Oral Daily  . carvedilol  25 mg Oral BID WC  . diltiazem  180 mg Oral Daily  . famotidine  20 mg Oral Daily  . gabapentin  600 mg Oral TID  . insulin aspart  0-9 Units Subcutaneous TID WC & HS  . insulin aspart  15 Units Subcutaneous TID WC  . insulin detemir  85 Units Subcutaneous QHS  . pantoprazole  40 mg Oral BID  . predniSONE  10 mg Oral BID WC    OBJECTIVE  Filed Vitals:   12/26/14 1631 12/26/14 1725 12/26/14 1959 12/27/14 0336  BP: 156/80 150/87 154/81 167/84  Pulse: 67  71 74  Temp: 99 F (37.2 C)  99.2 F (37.3 C) 98.7 F (37.1 C)  TempSrc: Oral  Oral Oral  Resp: _0 Height:      Weight:    228 lb 2 oz (103.478 kg)  SpO2: 95%  92% 98%    Intake/Output Summary (Last 24 hours) at 12/27/14 0934 Last data filed at 12/27/14 0336  Gross per 24 hour  Intake    360 ml  Output   1025 ml  Net   -665 ml   Filed Weights   12/25/14 0400 12/26/14 0400 12/27/14 0336  Weight: 228 lb 11.2 oz (103.738 kg) 228 lb 6.3 oz (103.6 kg) 228 lb 2 oz (103.478 kg)    PHYSICAL EXAM  General: Pleasant, NAD. Neuro: Alert and oriented X 3. Moves all extremities spontaneously. Psych: Normal affect. HEENT:  Normal  Neck: Supple without bruits or JVD. Lungs:  Resp regular and unlabored, CTA. Heart: RRR no s3,  s4, or murmurs. Abdomen: Soft, non-tender, non-distended, BS + x 4.  Extremities: No clubbing, cyanosis. Trace LE edema.  DP/PT/Radials 2+ and equal bilaterally.  Continued pain out of proportion with exam LEs, UEs, and abdomen.  Accessory Clinical Findings  CBC  Recent Labs  12/25/14 0350  WBC 9.7  HGB 13.1  HCT 41.1  MCV 97.9  PLT 867   Basic Metabolic Panel  Recent Labs  12/25/14 0350 12/26/14 0253  NA 138 141  K 4.7 3.5  CL 103 105  CO2 24 26  GLUCOSE 346* 239*  BUN 16 10  CREATININE 1.51* 1.30*  CALCIUM 8.5* 8.3*   Cardiac Enzymes  Recent Labs  12/26/14 1551 12/26/14 2200 12/27/14 0342  TROPONINI 0.05* 0.04* 0.04*   Hemoglobin A1C  Recent Labs  12/26/14 1650  HGBA1C 12.6*    TELE NSR 60-70s. 1 5 beat run of NSVT.     ECG No new EKG  Echocardiogram  12/24/14 Study Conclusions  - Left ventricle: The cavity size was normal. Wall thickness was increased in a pattern of moderate LVH. Systolic function  was normal. The estimated ejection fraction was in the range of 50% to 55%. Wall motion was normal; there were no regional wall motion abnormalities. Doppler parameters are consistent with abnormal left ventricular relaxation (grade 1 diastolic dysfunction). - Pericardium: There was no pericardial effusion.    Radiology/Studies  Dg Chest 2 View  12/23/2014   CLINICAL DATA:  Mid chest pain beginning yesterday.  EXAM: CHEST  2 VIEW  COMPARISON:  None.  FINDINGS: Lungs are adequately inflated without consolidation or effusion. Cardiomediastinal silhouette is within normal. Mild degenerative change of the spine with prominent osteophytes over the mid to lower thoracic spine.  IMPRESSION: No active cardiopulmonary disease.   Electronically Signed   By: Marin Olp M.D.   On: 12/23/2014 16:11   Ct Angio Chest Pe W/cm &/or Wo Cm  12/23/2014   CLINICAL DATA:  59 year old male with chest pain and shortness of breath for 1 day.  EXAM: CT  ANGIOGRAPHY CHEST WITH CONTRAST  TECHNIQUE: Multidetector CT imaging of the chest was performed using the standard protocol during bolus administration of intravenous contrast. Multiplanar CT image reconstructions and MIPs were obtained to evaluate the vascular anatomy.  CONTRAST:  39m OMNIPAQUE IOHEXOL 350 MG/ML SOLN  COMPARISON:  12/23/2014 chest radiograph  FINDINGS: This is a technically satisfactory study.  Mediastinum/Nodes: No pulmonary emboli are identified. There is no evidence of thoracic aortic aneurysm. Cardiomegaly is identified. Mild mild-moderate coronary artery calcifications are present. No enlarged lymph nodes. No pericardial effusion.  Lungs/Pleura: No pleural effusion identified. Mild dependent and bibasilar atelectasis noted. There is no evidence of airspace disease, consolidation, nodule or mass. No endobronchial or endotracheal lesions identified.  Upper abdomen: Hepatic steatosis and nonobstructing left upper pole renal calculi noted. A 2 cm indeterminate right upper renal lesion identified.  Musculoskeletal: No acute or suspicious bony abnormalities identified.  Review of the MIP images confirms the above findings.  IMPRESSION: No evidence of pulmonary emboli or thoracic aortic aneurysm.  Cardiomegaly and coronary artery disease.  Mild dependent and bibasilar atelectasis.  2 cm indeterminate right upper renal lesion. Consider further evaluation first with ultrasound if no prior outside studies are available for comparison.  Hepatic steatosis and nonobstructing left upper pole renal calculi.   Electronically Signed   By: JMargarette CanadaM.D.   On: 12/23/2014 18:50   [place]  12/24/2014   CLINICAL DATA:  59year old male with indeterminate right renal lesion on chest CT.  EXAM: RENAL / URINARY TRACT ULTRASOUND COMPLETE  COMPARISON:  12/23/2014 chest CT  FINDINGS: Right Kidney:  Length: 13.5 cm. Increased renal echogenicity noted with moderate cortical thinning. A 2.5 x 2 x 2.1 cm upper pole  cyst is noted. No definite solid mass or hydronephrosis identified.  Left Kidney:  Length: 13.1 cm. Increased renal echogenicity noted with moderate cortical thinning. A 2.3 x 1.5 x 2.2 cm lower pole cyst is present. No definite solid mass or hydronephrosis identified.  Bladder:  Appears normal for degree of bladder distention.  IMPRESSION: Bilateral renal cysts.  Increased renal echogenicity with moderate cortical thinning compatible with medical renal disease. No evidence of hydronephrosis.   Electronically Signed   By: JMargarette CanadaM.D.   On: 12/24/2014 18:23   Nuclear Medicine Myocardial Perfusion [facility]2014 [facility] [place]   STRESS PROTOCOL: Regadenoson The patient was infused intravenously with Regadenoson at 0.08 mg/ml for a period of 10 seconds. A total Regadenoson dose of 0.4 mg was injected intravenously. The patient's heart rate increased from 78  bpm at rest to 112 at peak stress. The patient's blood pressure at rest was 160/100 mmHg and decreased to 130/58 mmHg at peak stress. Blood pressure response was normal. Chest pain symptoms did not occur. Other symptoms that occurred included dyspnea, flushing.  STRESS TEST FINDINGS: Adequacy of Stress: Excellent Overall Study Quality:acceptable Extra Cardiac Activity: Normal Scan Significance: Normal and indicates a very low risk for hard cardiac events Study Artifacts: Diaphragmatic attenuation Stress/Rest LV Volume Ration: 1.05, borderline abnormal Estimated EF : 55%   Cardiac Catheterization 02/12/2013 Performed at [facility].  [place].   Procedures performed Left heart catheterization with ventriculography Left coronary angiography Right coronary angiography Specimen collection: NONE  FINDINGS: Ventricles Analysis of regional contractile function demonstrated moderate anterobasal hypokinesis, moderate anterolateral hypokinesis, mild apical hypokinesis, mild diaphragmatic hypokinesis,  and moderate posterobasal hypokinesis.  Global left ventricular function was moderately depressed. EF estimated was 40%. The left ventricle was mildy dilated.  Coronary circulation The coronary circulation was co-dominant  LM: Left main Normal LAD: Angiography showed mild atherosclerosis CX: Angiography showed mild atherosclerosis RCA: Angiography showed mild atherosclerosis.  Recommendations:  Coronary angiography demonstrates mild nonobstructive disease. The LVEF is mildly dilated with a reduced EF of 40%.  Plan to maximize medical therapy for afterload reduction. Continue aggressive risk factor modification. Maximize medical therapy for rate control with AFib  COMPLETE PROCEDURE in Paper Chart   ASSESSMENT AND PLAN  1. Chest pain - Atypical presentation for ischemia - Similar to previous angina - trop has no clear trend, mildly elevated but flat.  - + D-dimer, Chest CT negative for PE. CRP negative - Echo shows EF of 61-44%, grade 1 diastolic dysfunction. Similar to stress finding 09/25/14 in [place]. - Outside records from cath [phone] showed mild nonobstructive disease of LAD, CX, and RCA - lexiscan myoview 09/25/2014 small inferolateral mild fixed defect appears to be artifect from diaphragm, very low risk.  - Considered pericarditis as a differential diagnosis, but pt unable to tolerate MRI machine.   - start emperic cholchicine. OK to discharge from cardiac perspective. Will arrange outpatient followup   2.CAD (coronary artery disease)  -Recent work-up as listed in #1. -Cont heparin, Nitro, ASA, statin, BB, CCB, ACEI.  3. PAF (paroxysmal atrial fibrillation)  -Rate controlled. No afib noted thus far during admission.  4. Essential hypertension  -Cont carvedilol, diltiazem, lasix, and lisinopril  5.  Hyperlipidemia -Continue statin  6. AKI (acute kidney injury) - Increased Cr likely related to dye from Chest Ct.  - Trending down    7. PUD (peptic ulcer disease)  - Continue PPI  8. DM2 (diabetes mellitus, type 2) - Levemir with mealtime coverage per IM.  Hilbert Corrigan [name] Pager: [phone]   The patient was seen, examined and discussed with Almyra Deforest, [name] and I agree with the above.   59 year old male with atypical chest pain, h/o CAD, MI in 02/2014, recently moved here from [place] where he had his prior work up done, cath 2014 with mild non-obstructive disease and negative stress test in 09/2014.  The symptoms are atypical, related to position change and deep inspiration. ESR is mildly elevated. Mild troponin elevation is flat, minimally elevated most probably sec to acute on CKD, he is on iv Heparin, on and off chest pain now. The patient didn't tolerate cardiac MRI due to claustrophobia.  We will start a trial of empiric pericarditis treatment with colchicine 0.6 mg po BID x 1 month, no NSAIDS as he was recently  diagnosed with PUD. He is on chronic prednisone 10 mg po daily. Discharge today, we will arrange for an outpatient follow up. I would also add imdur 30 mg po daily to his regimen as he is hypertensive.   Dorothy Spark 12/27/2014

## 2014-12-29 ENCOUNTER — Inpatient Hospital Stay: Payer: Medicaid - Out of State | Admitting: Family Medicine

## 2015-01-23 ENCOUNTER — Telehealth: Payer: Self-pay | Admitting: Physician Assistant

## 2015-01-23 NOTE — Progress Notes (Signed)
Cardiology Office Note   Date:  01/24/2015   ID:  Marco [name], DOB 10-28-1955, MRN [id number]  PCP:  Pcp Not In System  Cardiologist:  Dr. [name]     Chief Complaint  Patient presents with  . Hospitalization Follow-up    admitted with chest pain  . Coronary Artery Disease     History of Present Illness: Marco [name] is a 59 y.o. male with a hx of IDDM, HTN, atrial fibrillation s/p ablation, CAD s/p reported MI 02/2014, CKD, rheumatoid arthritis, peptic ulcer disease. [place] from [place] and [place] previously followed by cardiology there.  Admitted 5/14-5/18 with chest pain and minimally elevated troponins in a flat trend (0.04-0.05-0.04). Symptoms were worse with positional changes and deep inspiration. D-dimer was elevated. Chest CT demonstrated no pulmonary embolism. Sedimentation rate was only mildly elevated. MRI was planned to rule out pericarditis. However the patient could not tolerate due to claustrophobia. Echocardiogram demonstrated normal LV function with moderate LVH, no wall motion abnormalities and mild diastolic dysfunction. Prior records from [place] were received. Stress testing in 09/2014 was low risk with normal EF. Cardiac catheterization in 02/2013 demonstrated mild nonobstructive CAD with an EF of 40%. Patient was placed on a trial of empiric colchicine to cover for pericarditis long-acting nitrates were also added to his medical regimen.  He returns for follow-up.  Studies/Reports Reviewed Today:  Echo 12/24/14 - Left ventricle: The cavity size was normal. Wall thickness was increased in a pattern of moderate LVH. Systolic function was normal. The estimated ejection fraction was in the range of 50% to 55%. Wall motion was normal; there were no regional wall motion abnormalities. Doppler parameters are consistent with abnormal left ventricular relaxation (grade 1 diastolic dysfunction).  Nuclear Medicine Myocardial Perfusion  09/25/2014 Performed at [facility]. [place]. STRESS PROTOCOL: Regadenoson The patient was infused intravenously with Regadenoson at 0.08 mg/ml for a period of 10 seconds. A total Regadenoson dose of 0.4 mg was injected intravenously. The patient's heart rate increased from 78 bpm at rest to 112 at peak stress. The patient's blood pressure at rest was 160/100 mmHg and decreased to 130/58 mmHg at peak stress. Blood pressure response was normal. Chest pain symptoms did not occur. Other symptoms that occurred included dyspnea, flushing. STRESS TEST FINDINGS: Adequacy of Stress: Excellent Overall Study Quality:acceptable Extra Cardiac Activity: Normal Scan Significance: Normal and indicates a very low risk for hard cardiac events Study Artifacts: Diaphragmatic attenuation Stress/Rest LV Volume Ration: 1.05, borderline abnormal Estimated EF : 55%   Cardiac Catheterization 02/12/2013 Performed at [facility] [place]. Procedures performed Left heart catheterization with ventriculography Left coronary angiography Right coronary angiography Specimen collection: NONE  FINDINGS: Ventricles Analysis of regional contractile function demonstrated moderate anterobasal hypokinesis, moderate anterolateral hypokinesis, mild apical hypokinesis, mild diaphragmatic hypokinesis, and moderate posterobasal hypokinesis. Global left ventricular function was moderately depressed. EF estimated was 40%. The left ventricle was mildy dilated. Coronary circulation The coronary circulation was co-dominant LM: Left main Normal LAD: Angiography showed mild atherosclerosis CX: Angiography showed mild atherosclerosis RCA: Angiography showed mild atherosclerosis. Recommendations: Coronary angiography demonstrates mild nonobstructive disease. The LVEF is mildly dilated with a reduced EF of 40%.    Past Medical History  Diagnosis Date  . Coronary artery disease   . Hypertension   .  Diabetes mellitus without complication   . Arthritis     Past Surgical History  Procedure Laterality Date  . Colon surgery  09/2014    colon resection   .  Cardiac catheterization  05/2014    ablation for atrial fibrillation     Current Outpatient Prescriptions  Medication Sig Dispense Refill  . aspirin EC 81 MG tablet Take 81 mg by mouth daily.    [name] atorvastatin (LIPITOR) 40 MG tablet Take 40 mg by mouth daily.    . carvedilol (COREG) 25 MG tablet Take 25 mg by mouth 2 (two) times daily.    . colchicine 0.6 MG tablet Take 1 tablet (0.6 mg total) by mouth 2 (two) times daily. 30 tablet 0  . diltiazem (DILACOR XR) 180 MG 24 hr capsule Take 180 mg by mouth daily.     . furosemide (LASIX) 40 MG tablet Take 40 mg by mouth daily.    [name] gabapentin (NEURONTIN) 600 MG tablet Take 600 mg by mouth 3 (three) times daily.    . insulin detemir (LEVEMIR) 100 UNIT/ML injection Inject 80 Units into the skin at bedtime.    . Insulin Glulisine (APIDRA) 100 UNIT/ML Solostar Pen Inject 16 Units into the skin 2 (two) times daily.    [name] lisinopril (PRINIVIL,ZESTRIL) 30 MG tablet Take 30 mg by mouth daily.    . pantoprazole (PROTONIX) 40 MG tablet Take 40 mg by mouth 2 (two) times daily.    . Polyvinyl Alcohol-Povidone (REFRESH OP) Place 1 drop into both eyes 3 (three) times daily as needed (dry eyes).    . predniSONE (DELTASONE) 10 MG tablet Take 10 mg by mouth 2 (two) times daily.    . ranitidine (ZANTAC) 150 MG capsule Take 150 mg by mouth 2 (two) times daily.    . sildenafil (VIAGRA) 100 MG tablet Take 100 mg by mouth daily as needed for erectile dysfunction.    . sitaGLIPtin (JANUVIA) 100 MG tablet Take 100 mg by mouth daily.     No current facility-administered medications for this visit.    Allergies:   Review of patient's allergies indicates no known allergies.    Social History:  The patient  reports that he has never smoked. He does not have any smokeless tobacco history on file. He reports  that he does not drink alcohol or use illicit drugs.   Family History:  The patient's family history is not on file.    ROS:   Please see the history of present illness.   ROS    PHYSICAL EXAM: VS:  There were no vitals taken for this visit.    Wt Readings from Last 3 Encounters:  12/27/14 228 lb 2 oz (103.478 kg)     GEN: Well nourished, well developed, in no acute distress HEENT: normal Neck: no JVD, no carotid bruits, no masses Cardiac:  Normal S1/S2, RRR; no murmur ,  no rubs or gallops, no edema  Respiratory:  clear to auscultation bilaterally, no wheezing, rhonchi or rales. GI: soft, nontender, nondistended, + BS MS: no deformity or atrophy Skin: warm and dry  Neuro:  CNs II-XII intact, Strength and sensation are intact Psych: Normal affect   EKG:  EKG is ordered today.  It demonstrates:      Recent Labs: 12/23/2014: B Natriuretic Peptide 76.5; TSH 0.757 12/25/2014: Hemoglobin 13.1; Platelets 217 12/26/2014: BUN 10; Creatinine, Ser 1.30*; Potassium 3.5; Sodium 141    Lipid Panel    Component Value Date/Time   CHOL 127 12/23/2014 2149   TRIG 192* 12/23/2014 2149   HDL 34* 12/23/2014 2149   CHOLHDL 3.7 12/23/2014 2149   VLDL 38 12/23/2014 2149   [place] 55 12/23/2014 2149  ASSESSMENT AND PLAN:  Chest pain, unspecified chest pain type  Coronary artery disease involving native coronary artery of native heart with angina pectoris with documented spasm  Essential hypertension  PAF (paroxysmal atrial fibrillation)  HLD (hyperlipidemia)  PUD (peptic ulcer disease)  Type 2 diabetes mellitus with hyperglycemia  CKD (chronic kidney disease), unspecified stage     Current medicines are reviewed at length with the patient today.  Concerns regarding medicines are as outlined above.  The following changes have been made:    As above  Labs/ tests ordered today include:   No orders of the defined types were placed in this encounter.      Disposition:   FU with    Signed, Versie Starks, MHS 01/24/2015 2:05 [facility] [place], [place], [place]  [address] Phone: [phone]; Fax: [phone]    This encounter was created in error - please disregard.

## 2015-01-24 ENCOUNTER — Encounter: Payer: Medicaid - Out of State | Admitting: Physician Assistant

## 2015-02-01 ENCOUNTER — Emergency Department (HOSPITAL_COMMUNITY): Payer: Medicaid - Out of State

## 2015-02-01 ENCOUNTER — Emergency Department (HOSPITAL_COMMUNITY)
Admission: EM | Admit: 2015-02-01 | Discharge: 2015-02-02 | Disposition: A | Discharge status: Home / Self Care | Payer: Medicaid - Out of State | Attending: Emergency Medicine | Admitting: Emergency Medicine

## 2015-02-01 ENCOUNTER — Encounter (HOSPITAL_COMMUNITY): Payer: Self-pay | Admitting: Emergency Medicine

## 2015-02-01 DIAGNOSIS — R079 Chest pain, unspecified: Secondary | ICD-10-CM | POA: Diagnosis present

## 2015-02-01 DIAGNOSIS — I2510 Atherosclerotic heart disease of native coronary artery without angina pectoris: Secondary | ICD-10-CM | POA: Insufficient documentation

## 2015-02-01 DIAGNOSIS — Z7982 Long term (current) use of aspirin: Secondary | ICD-10-CM | POA: Insufficient documentation

## 2015-02-01 DIAGNOSIS — E119 Type 2 diabetes mellitus without complications: Secondary | ICD-10-CM | POA: Insufficient documentation

## 2015-02-01 DIAGNOSIS — R252 Cramp and spasm: Secondary | ICD-10-CM | POA: Diagnosis not present

## 2015-02-01 DIAGNOSIS — Z79899 Other long term (current) drug therapy: Secondary | ICD-10-CM | POA: Diagnosis not present

## 2015-02-01 DIAGNOSIS — Z7952 Long term (current) use of systemic steroids: Secondary | ICD-10-CM | POA: Insufficient documentation

## 2015-02-01 DIAGNOSIS — Z9889 Other specified postprocedural states: Secondary | ICD-10-CM | POA: Insufficient documentation

## 2015-02-01 DIAGNOSIS — R0602 Shortness of breath: Secondary | ICD-10-CM | POA: Insufficient documentation

## 2015-02-01 DIAGNOSIS — M1990 Unspecified osteoarthritis, unspecified site: Secondary | ICD-10-CM | POA: Insufficient documentation

## 2015-02-01 DIAGNOSIS — I10 Essential (primary) hypertension: Secondary | ICD-10-CM | POA: Insufficient documentation

## 2015-02-01 DIAGNOSIS — R251 Tremor, unspecified: Secondary | ICD-10-CM | POA: Insufficient documentation

## 2015-02-01 DIAGNOSIS — Z794 Long term (current) use of insulin: Secondary | ICD-10-CM | POA: Diagnosis not present

## 2015-02-01 LAB — BASIC METABOLIC PANEL
Anion gap: 10 (ref 5–15)
CO2: 33 mmol/L — ABNORMAL HIGH (ref 22–32)
Calcium: 8.5 mg/dL — ABNORMAL LOW (ref 8.9–10.3)
Chloride: 97 mmol/L — ABNORMAL LOW (ref 101–111)
Creatinine, Ser: 1.27 mg/dL — ABNORMAL HIGH (ref 0.61–1.24)
GFR calc Af Amer: >60 mL/min (ref >60)
GFR calc non Af Amer: 60 mL/min — ABNORMAL LOW (ref >60)
GLUCOSE: 212 mg/dL — AB (ref 65–99)
POTASSIUM: 3.2 mmol/L — AB (ref 3.5–5.1)
SODIUM: 140 mmol/L (ref 135–145)

## 2015-02-01 LAB — CBC WITH DIFFERENTIAL/PLATELET
BASOS PCT: 0 % (ref 0–1)
Basophils Absolute: 0 10*3/uL (ref 0.0–0.1)
EOS PCT: 1 % (ref 0–5)
Eosinophils Absolute: 0.1 10*3/uL (ref 0.0–0.7)
HCT: 45.2 % (ref 39.0–52.0)
Hemoglobin: 14.6 g/dL (ref 13.0–17.0)
LYMPHS PCT: 22 % (ref 12–46)
Lymphs Abs: 2 10*3/uL (ref 0.7–4.0)
MCH: 30.5 pg (ref 26.0–34.0)
MCHC: 32.3 g/dL (ref 30.0–36.0)
MCV: 94.4 fL (ref 78.0–100.0)
Monocytes Absolute: 1 10*3/uL (ref 0.1–1.0)
Monocytes Relative: 10 % (ref 3–12)
NEUTROS PCT: 67 % (ref 43–77)
Neutro Abs: 6.2 10*3/uL (ref 1.7–7.7)
PLATELETS: 168 10*3/uL (ref 150–400)
RBC: 4.79 MIL/uL (ref 4.22–5.81)
RDW: 14 % (ref 11.5–15.5)
WBC: 9.3 10*3/uL (ref 4.0–10.5)

## 2015-02-01 LAB — BRAIN NATRIURETIC PEPTIDE: B Natriuretic Peptide: 36.6 pg/mL (ref 0.0–100.0)

## 2015-02-01 LAB — I-STAT TROPONIN, ED: Troponin i, poc: 0.05 ng/mL (ref 0.00–0.08)

## 2015-02-01 MED ORDER — IOHEXOL 350 MG/ML SOLN
80.0000 mL | Freq: Once | INTRAVENOUS | Status: AC | PRN
  Administered 2015-02-01: 80 mL via INTRAVENOUS

## 2015-02-01 MED ORDER — POTASSIUM CHLORIDE ER 10 MEQ PO TBCR: 10.0000 meq | EXTENDED_RELEASE_TABLET | Freq: Every day | ORAL | Status: DC

## 2015-02-01 MED ORDER — POTASSIUM CHLORIDE CRYS ER 20 MEQ PO TBCR
40.0000 meq | EXTENDED_RELEASE_TABLET | Freq: Once | ORAL | Status: AC
  Administered 2015-02-01: 40 meq via ORAL
  Filled 2015-02-01: qty 2

## 2015-02-01 NOTE — ED Notes (Signed)
PA at bedside.

## 2015-02-01 NOTE — Discharge Instructions (Signed)
Take potassium as directed until gone. Refer to attached documents for more information. Follow up with [facility] as scheduled. Return to the ED with worsening or concerning symptoms.

## 2015-02-01 NOTE — ED Provider Notes (Signed)
CSN: [id number]     Arrival date & time 02/01/15  1658 History   First MD Initiated Contact with Patient 02/01/15 2115     Chief Complaint  Patient presents with  . Chest Pain  . Shortness of Breath     (Consider location/radiation/quality/duration/timing/severity/associated sxs/prior Treatment) HPI Comments: Patient is a 59 year old male with a past medical history of CAD, hypertension, diabetes, and arthritis who presents with chest pain and shortness of breath that has been continuous for the past 3 days. Symptoms started gradually and remained constant since the onset. The pain is aching and located over his anterior chest. Deep inspiration makes the pain worse. No alleviating factors. Patient reports associated "shaking" and "tremors" for the past 3 weeks. No other associated symptoms.    Past Medical History  Diagnosis Date  . Coronary artery disease   . Hypertension   . Diabetes mellitus without complication   . Arthritis    Past Surgical History  Procedure Laterality Date  . Colon surgery  09/2014    colon resection   . Cardiac catheterization  05/2014    ablation for atrial fibrillation   History reviewed. No pertinent family history. History  Substance Use Topics  . Smoking status: Never Smoker   . Smokeless tobacco: Not on file  . Alcohol Use: No    Review of Systems  Respiratory: Positive for shortness of breath.   Cardiovascular: Positive for chest pain.  Neurological: Positive for tremors.  All other systems reviewed and are negative.     Allergies  Review of patient's allergies indicates no known allergies.  Home Medications   Prior to Admission medications   Medication Sig Start Date End Date Taking? Authorizing Provider  aspirin EC 81 MG tablet Take 81 mg by mouth daily.   Yes Historical Provider, MD  atorvastatin (LIPITOR) 40 MG tablet Take 40 mg by mouth daily.   Yes Historical Provider, MD  carvedilol (COREG) 25 MG tablet Take 25 mg by mouth 2  (two) times daily.   Yes Historical Provider, MD  diltiazem (DILACOR XR) 180 MG 24 hr capsule Take 180 mg by mouth daily.    Yes Historical Provider, MD  furosemide (LASIX) 40 MG tablet Take 40 mg by mouth daily.   Yes Historical Provider, MD  gabapentin (NEURONTIN) 600 MG tablet Take 600 mg by mouth 3 (three) times daily.   Yes Historical Provider, MD  insulin detemir (LEVEMIR) 100 UNIT/ML injection Inject 80 Units into the skin at bedtime.   Yes Historical Provider, MD  Insulin Glulisine (APIDRA) 100 UNIT/ML Solostar Pen Inject 16 Units into the skin 2 (two) times daily.   Yes Historical Provider, MD  lisinopril (PRINIVIL,ZESTRIL) 30 MG tablet Take 30 mg by mouth daily.   Yes Historical Provider, MD  pantoprazole (PROTONIX) 40 MG tablet Take 40 mg by mouth 2 (two) times daily.   Yes Historical Provider, MD  Polyvinyl Alcohol-Povidone (REFRESH OP) Place 1 drop into both eyes 3 (three) times daily as needed (dry eyes).   Yes Historical Provider, MD  predniSONE (DELTASONE) 10 MG tablet Take 10 mg by mouth 2 (two) times daily.   Yes Historical Provider, MD  ranitidine (ZANTAC) 150 MG capsule Take 150 mg by mouth 2 (two) times daily.   Yes Historical Provider, MD  sildenafil (VIAGRA) 100 MG tablet Take 100 mg by mouth daily as needed for erectile dysfunction.   Yes Historical Provider, MD  sitaGLIPtin (JANUVIA) 100 MG tablet Take 100 mg by mouth daily.  Yes Historical Provider, MD  colchicine 0.6 MG tablet Take 1 tablet (0.6 mg total) by mouth 2 (two) times daily. Patient not taking: Reported on 02/01/2015 12/27/14   Kinnie Feil, MD   BP 129/84 mmHg  Pulse 85  Temp(Src) 99.8 F (37.7 C) (Oral)  Resp 22  SpO2 93% Physical Exam  Constitutional: He is oriented to person, place, and time. He appears well-developed and well-nourished. No distress.  HENT:  Head: Normocephalic and atraumatic.  Eyes: Conjunctivae and EOM are normal.  Neck: Normal range of motion.  Cardiovascular: Normal rate and  regular rhythm.  Exam reveals no gallop and no friction rub.   No murmur heard. No lower extremity edema or calf tenderness to palpation.   Pulmonary/Chest: Effort normal and breath sounds normal. He has no wheezes. He has no rales. He exhibits no tenderness.  Abdominal: Soft. He exhibits no distension. There is no tenderness. There is no rebound.  Musculoskeletal: Normal range of motion.  Neurological: He is alert and oriented to person, place, and time. Coordination normal.  Speech is goal-oriented. Moves limbs without ataxia.   Skin: Skin is warm and dry.  Psychiatric: He has a normal mood and affect. His behavior is normal.  Nursing note and vitals reviewed.   ED Course  Procedures (including critical care time) Labs Review Labs Reviewed  BASIC METABOLIC PANEL - Abnormal; Notable for the following:    Potassium 3.2 (*)    Chloride 97 (*)    CO2 33 (*)    Glucose, Bld 212 (*)    BUN <5 (*)    Creatinine, Ser 1.27 (*)    Calcium 8.5 (*)    GFR calc non Af Amer 60 (*)    All other components within normal limits  BRAIN NATRIURETIC PEPTIDE  CBC WITH DIFFERENTIAL/PLATELET  Randolm Idol, ED    Imaging Review Dg Chest 2 View  02/01/2015   CLINICAL DATA:  Patient with midline chest pain, shortness breath and multiple falls.  EXAM: CHEST  2 VIEW  COMPARISON:  Chest radiograph 12/23/2014  FINDINGS: Stable enlarged cardiac and mediastinal contours. No consolidative pulmonary opacities. No pleural effusion or pneumothorax. Mid thoracic spine degenerative changes.  IMPRESSION: No acute cardiopulmonary process.   Electronically Signed   By: Lovey Newcomer M.D.   On: 02/01/2015 17:58     EKG Interpretation None      MDM   Final diagnoses:  SOB (shortness of breath)  Chest pain  Muscle cramps    9:16 PM Labs and chest xray reviewed by me. Labs show elevated CO2 at 33 and potassium 3.2 with no other  acute changes.   11:31 PM Patient's CT angio reviewed by me and shows no  acute changes or PE. Patient is not shaking or having tremors on my exam. Muscle aching may be related to mild hypokalemia. Patient will be discharged with potassium prescription. Mariann Laster, with case management, met with the patient and scheduled an appointment with him for [facility] for next week.    Alvina Chou, [name] 02/01/15 2332  Dorie Rank, MD 02/02/15 [phone]

## 2015-02-01 NOTE — ED Notes (Addendum)
Pt from home for eval of intermittent "shaking episodes" that causes him to fall and also noted a decline in ability to ambulate. Pt also reports leg pain and swelling, legs tender to touch. Pt also reports some sob and pain with inspiration. Nad noted.

## 2015-02-01 NOTE — ED Notes (Signed)
Pt sts generalized CP and SOB with some shaking; pt sts shaking x weeks; pt sts pain worse with inspiration

## 2015-02-01 NOTE — Care Management Note (Signed)
Case Management Note  Patient Details  Name: Marco [name] MRN: [id number] Date of Birth: 12-19-1955  Subjective/Objective:                   Patient presented to [facility] ED s/p fall with tremors  Action/Plan Establishing follow up  care with a   PCP/ [place] [facility] assistance   Expected Discharge Date:       02/01/15 Expected Discharge Plan:   Disposition with PCP follow up  In-House Referral:     Discharge planning Services   [facility] Acute Care Choice:    Choice offered to:   Patient   DME Arranged:    DME Agency:     HH Arranged:    [place] Agency:     Status of Service:   completed  Medicare Important Message Given:    Date Medicare IM Given:    Medicare IM give by:    Date Additional Medicare IM Given:    Additional Medicare Important Message give by:     If discussed at [place] of Stay Meetings, dates discussed:    Additional Comments:  ED CM consulted by Orson Eva [name] on Pod A concerning patient establishing with PCP. Patient and wife recently relocated from [facility] and needs assistance with PCP. Patient was receiving disability Medicaid in [place], patient made aware that he will need to reapply with DSS for [place] medicaid, patient verbalized understanding and he states, he has contact information. Discussed the [facility] and the services rendered, patient he is agreeable to establish care at the clinic. Appt was scheduled for Monday 6/27 at 9:30am with Dr. Jarold Song. Provided patient with clinic information and appt. Patient encouraged  To keep appointment, if he is unable to keep appt instructed to call at least 24hr before to cancel appt. Patient verbalized understanding and appreciative of assistance. Laurena Slimmer, RN 02/01/2015, 10:40 PM

## 2015-02-02 NOTE — ED Notes (Signed)
Pt. Left with all belongings and refused wheelchair 

## 2015-02-05 ENCOUNTER — Other Ambulatory Visit: Payer: Self-pay

## 2015-02-05 ENCOUNTER — Inpatient Hospital Stay: Payer: Medicaid - Out of State | Admitting: Family Medicine

## 2015-02-05 ENCOUNTER — Telehealth: Payer: Self-pay | Admitting: General Practice

## 2015-02-05 ENCOUNTER — Other Ambulatory Visit: Payer: Self-pay | Admitting: Family Medicine

## 2015-02-05 ENCOUNTER — Ambulatory Visit: Payer: Medicaid Other | Attending: Family Medicine | Admitting: Family Medicine

## 2015-02-05 ENCOUNTER — Encounter: Payer: Self-pay | Admitting: Family Medicine

## 2015-02-05 VITALS — BP 98/63 | HR 79 | Temp 98.2°F | Resp 16 | Ht 66.0 in | Wt 225.4 lb

## 2015-02-05 DIAGNOSIS — I429 Cardiomyopathy, unspecified: Secondary | ICD-10-CM | POA: Diagnosis not present

## 2015-02-05 DIAGNOSIS — Z7982 Long term (current) use of aspirin: Secondary | ICD-10-CM | POA: Insufficient documentation

## 2015-02-05 DIAGNOSIS — K279 Peptic ulcer, site unspecified, unspecified as acute or chronic, without hemorrhage or perforation: Secondary | ICD-10-CM | POA: Diagnosis not present

## 2015-02-05 DIAGNOSIS — M069 Rheumatoid arthritis, unspecified: Secondary | ICD-10-CM | POA: Insufficient documentation

## 2015-02-05 DIAGNOSIS — E876 Hypokalemia: Secondary | ICD-10-CM | POA: Insufficient documentation

## 2015-02-05 DIAGNOSIS — R251 Tremor, unspecified: Secondary | ICD-10-CM | POA: Diagnosis not present

## 2015-02-05 DIAGNOSIS — I2510 Atherosclerotic heart disease of native coronary artery without angina pectoris: Secondary | ICD-10-CM | POA: Insufficient documentation

## 2015-02-05 DIAGNOSIS — Z79899 Other long term (current) drug therapy: Secondary | ICD-10-CM | POA: Insufficient documentation

## 2015-02-05 DIAGNOSIS — E1165 Type 2 diabetes mellitus with hyperglycemia: Secondary | ICD-10-CM | POA: Insufficient documentation

## 2015-02-05 DIAGNOSIS — R079 Chest pain, unspecified: Secondary | ICD-10-CM | POA: Insufficient documentation

## 2015-02-05 DIAGNOSIS — I10 Essential (primary) hypertension: Secondary | ICD-10-CM | POA: Diagnosis not present

## 2015-02-05 DIAGNOSIS — I4891 Unspecified atrial fibrillation: Secondary | ICD-10-CM | POA: Insufficient documentation

## 2015-02-05 DIAGNOSIS — Z794 Long term (current) use of insulin: Secondary | ICD-10-CM | POA: Insufficient documentation

## 2015-02-05 DIAGNOSIS — N189 Chronic kidney disease, unspecified: Secondary | ICD-10-CM

## 2015-02-05 LAB — BASIC METABOLIC PANEL
BUN: 8 mg/dL (ref 6–23)
CHLORIDE: 98 meq/L (ref 96–112)
CO2: 31 meq/L (ref 19–32)
Calcium: 8.9 mg/dL (ref 8.4–10.5)
Creat: 1.82 mg/dL — ABNORMAL HIGH (ref 0.50–1.35)
GLUCOSE: 235 mg/dL — AB (ref 70–99)
POTASSIUM: 3.6 meq/L (ref 3.5–5.3)
SODIUM: 145 meq/L (ref 135–145)

## 2015-02-05 LAB — GLUCOSE, POCT (MANUAL RESULT ENTRY): POC GLUCOSE: 232 mg/dL — AB (ref 70–99)

## 2015-02-05 MED ORDER — CARVEDILOL 12.5 MG PO TABS: 12.5000 mg | ORAL_TABLET | Freq: Two times a day (BID) | ORAL | Status: DC

## 2015-02-05 MED ORDER — COLCHICINE 0.6 MG PO TABS: 0.6000 mg | ORAL_TABLET | Freq: Two times a day (BID) | ORAL | Status: DC

## 2015-02-05 NOTE — Progress Notes (Signed)
Subjective:    Patient ID: Marco [name], male    DOB: April 18, 1956, 59 y.o.   MRN: [id number]  HPI  Marco [name]  Is a 59 year old male with a history of DM, HTN, h/o Cardiomyopathy, RA (on prednisone), h/o CAD, A fib (off xarelto due to GI bleed), h/o PUD  Who has had multiple ED visits recently for chest pains.  At his most recent visit on 02/01/15 he had complained of tremors which later resolved prior to discharge, TSH from 12/2014 was 0.757;he was found to be hypokalemic with a potassium of 3.2 which was replaced. He had chest pains and CTA was negative for PE.  In 12/2014 he also presented with chest pains and was seen by cardiology; he was admitted and had an extensive workup  for chest pain which was thought to be atypical presentation for ischemia by cardiology. 2-D echo revealed EF of 34-19%, grade 1 diastolic dysfunction. Lexiscan Myoview from 09/2014 revealed small inferolateral mild fixed defect appears to be artifact from diaphragm, very low risk. Pericarditis was considered as a differential and he was placed on colchicine which he never picked up due to cost.CT angio negative for PE, CXR was negative for any cardiopulmonary process.  The patient tells me he has had this chest pains for a while and was told in [place] it was musculoskeletal; he complains of dyspnea on mild exertion. He states he has not been taking Prednisone which he is supposed to take for Rheumatoid Arthritis and just realized it. Also has tremors and denies a history of alcohol abuse   Past Medical History  Diagnosis Date  . Coronary artery disease   . Hypertension   . Diabetes mellitus without complication   . Arthritis     Past Surgical History  Procedure Laterality Date  . Colon surgery  09/2014    colon resection   . Cardiac catheterization  05/2014    ablation for atrial fibrillation    History reviewed. No pertinent family history.  History   Social History  . Marital Status: Married    Spouse  Name: N/A  . Number of Children: N/A  . Years of Education: N/A   Occupational History  . Not on file.   Social History Main Topics  . Smoking status: Never Smoker   . Smokeless tobacco: Not on file  . Alcohol Use: No  . Drug Use: No  . Sexual Activity: Not on file   Other Topics Concern  . Not on file   Social History Narrative    No Known Allergies  Current Outpatient Prescriptions on File Prior to Visit  Medication Sig Dispense Refill  . aspirin EC 81 MG tablet Take 81 mg by mouth daily.    [name] atorvastatin (LIPITOR) 40 MG tablet Take 40 mg by mouth daily.    [name] diltiazem (DILACOR XR) 180 MG 24 hr capsule Take 180 mg by mouth daily.     . furosemide (LASIX) 40 MG tablet Take 40 mg by mouth daily.    [name] gabapentin (NEURONTIN) 600 MG tablet Take 600 mg by mouth 3 (three) times daily.    . insulin detemir (LEVEMIR) 100 UNIT/ML injection Inject 80 Units into the skin at bedtime.    . Insulin Glulisine (APIDRA) 100 UNIT/ML Solostar Pen Inject 16 Units into the skin 2 (two) times daily.    [name] lisinopril (PRINIVIL,ZESTRIL) 30 MG tablet Take 30 mg by mouth daily.    . pantoprazole (PROTONIX) 40 MG tablet Take 40 mg by  mouth 2 (two) times daily.    . Polyvinyl Alcohol-Povidone (REFRESH OP) Place 1 drop into both eyes 3 (three) times daily as needed (dry eyes).    . predniSONE (DELTASONE) 10 MG tablet Take 10 mg by mouth 2 (two) times daily.    . ranitidine (ZANTAC) 150 MG capsule Take 150 mg by mouth 2 (two) times daily.    . sildenafil (VIAGRA) 100 MG tablet Take 100 mg by mouth daily as needed for erectile dysfunction.    . sitaGLIPtin (JANUVIA) 100 MG tablet Take 100 mg by mouth daily.    . potassium chloride (K-DUR) 10 MEQ tablet Take 1 tablet (10 mEq total) by mouth daily. (Patient not taking: Reported on 02/05/2015) 5 tablet 0   No current facility-administered medications on file prior to visit.       Review of Systems  Constitutional: Negative for activity change and  appetite change.  HENT: Negative for sinus pressure and sore throat.   Eyes: Negative for visual disturbance.  Respiratory: Positive for shortness of breath.   Cardiovascular: Positive for chest pain. Negative for palpitations.  Gastrointestinal: Positive for abdominal pain. Negative for abdominal distention.  Endocrine: Negative for cold intolerance, heat intolerance and polyphagia.  Genitourinary: Negative for dysuria, frequency and difficulty urinating.  Musculoskeletal: Negative for back pain, joint swelling and arthralgias.  Skin: Negative for color change.  Neurological: Positive for tremors. Negative for dizziness and weakness.  Psychiatric/Behavioral: Negative for suicidal ideas and behavioral problems.         Objective: Filed Vitals:   02/05/15 1559 02/05/15 1601  BP:  88/61  Pulse:  79  Temp:  98.2 F (36.8 C)  TempSrc:  Oral  Resp:  16  Height: 5\' 6"  (1.676 m)   Weight: 225 lb 6.4 oz (102.241 kg)   SpO2:  92%       Physical Exam  Constitutional: He is oriented to person, place, and time. He appears well-developed and well-nourished.  HENT:  Head: Normocephalic and atraumatic.  Right Ear: External ear normal.  Left Ear: External ear normal.  Eyes: Conjunctivae and EOM are normal. Pupils are equal, round, and reactive to light.  Neck: Normal range of motion. Neck supple. No tracheal deviation present.  Cardiovascular: Normal rate, regular rhythm and normal heart sounds.   No murmur heard. Pulmonary/Chest: Effort normal and breath sounds normal. No respiratory distress. He has no wheezes. He exhibits tenderness.  Diffuse chest wall tenderness which is out of proportion to pressure applied.  Abdominal: Soft. Bowel sounds are normal. He exhibits no mass. There is tenderness.  Diffuse abdominal wall tenderness  Musculoskeletal: Normal range of motion. He exhibits tenderness. He exhibits no edema.  Tenderness on palpation of all muscle groups and joints.    Neurological: He is alert and oriented to person, place, and time.  Skin: Skin is warm and dry.  Psychiatric: He has a normal mood and affect.            Assessment & Plan:  59 y/o male with PMH of DM, HTN, h/o Cardiomyopathy, RA (on prednisone), h/o CAD, A fib (off xarelto due to GI bleed), h/o PUD and several Ed presentations for chest pains, work up unrevealing.   Chest pain: EKG performed in the clinic today  Revealed normal sinus rhythm with a rate of 75 bpm, left ventricular hypertrophy, ST and T wave abnormalities, consider lateral ischemia and this was unchanged compared to last EKG from 02/01/15.  Musculoskeletal etiology is a possibility given the reproducible  nature of the pain.  oxygen saturation is on the low side and this could be secondary to splinting from his chest pain.    Cardiomyopathy: Currrently Euvolemic  Peptic ulcer disease: Continue PPI.  he does have severe epigastric tenderness and I  Would like to place him on Carafate but he tells me he is unable to tolerate this as it makes him sick.   Hypertension:  blood pressure is extremely low today (he previously had a low reading of 97/64 during his ED visit) and so I will decrease the dose of his Carvedilol to 10 mg daily and will reassess his blood pressure at his next visit.   atrial fibrillation:  rate control with Diltiazem Unable to tolerate anticoagulant due to GI bleed.   Hypokalemia: Completed a course of Potassium  currently on lisinopril which should help with this. Basic metabolic panel today.   type 2 diabetes mellitus: Uncontrolled with A1c of 12.6, CBG of 212.   Tremors :  normal TSH of 0.757 in 12/2014.  Not demonstrable on my exam.  Rheumatoid Arthritis: Resume prednisone and i have placed a referral to the Rheumatologist.  This note has been created with Surveyor, quantity. Any transcriptional errors are unintentional.

## 2015-02-05 NOTE — Progress Notes (Signed)
Patient here for hospital follow up. Pain in chest and extremities. Pain rated at 10, described as crushing. Pain has been going on for about a week. Patient states the pain is pretty constant.   Patient reports that he thinks he is having an adverse effect from predisone.due to stopping it. He has not taken it 8-9 days and he doesn't think he is suppose to stop taking it.   Patient reports he shakes a lot and it makes him afraid to walk places because of the fear of falling. Patient reports he usually has to have assistance with walking.   Patient has difficulty gripping things. Patient reports if he holds things they fall out of his hand.   Patient reports having SOB which has been going on since chest pain started a week ago.   CBG is 232. Patient last ate around 1:30pm. Patient has not taken any insulin today.  83/56

## 2015-02-05 NOTE — Telephone Encounter (Signed)
Patient was seen in clinic today, upon check out patient stated that he forgot to mention a few issues that had been bothering him: bad taste in mouth etc. Spoke to PCP and PCP stated that this was to be expected with his acid reflux/ ulcer. Informed patient and patient states " This isn't going to work.. I have no desire to eat because I can't get this taste out of my mouth".  Please follow up with patient to discuss

## 2015-02-06 ENCOUNTER — Other Ambulatory Visit: Payer: Self-pay

## 2015-02-06 MED ORDER — COLCHICINE 0.6 MG PO TABS: 0.6000 mg | ORAL_TABLET | Freq: Two times a day (BID) | ORAL | Status: DC

## 2015-02-07 ENCOUNTER — Telehealth: Payer: Self-pay | Admitting: *Deleted

## 2015-02-07 NOTE — Telephone Encounter (Signed)
-----   Message from Arnoldo Morale, MD sent at 02/05/2015 11:07 PM EDT ----- Rapid rise in creatinine; will order repeat BMET please schedule for two weeks. Potassium level is back to normal

## 2015-02-07 NOTE — Telephone Encounter (Signed)
Gave patient lab results regarding his creatine and his potassium level.  Patient has appointment on 02/20/15 with Dr. Adrian Blackwater.  Dr. Jarold Song wanted him to come back around that time for a repeat BMET.  Transferred patient to Regino Schultze to make lab appointment for the same day right before his 3:30 appointment.

## 2015-02-07 NOTE — Telephone Encounter (Signed)
Left message for patient to return my call.

## 2015-02-07 NOTE — Telephone Encounter (Signed)
Patient called in to ask why he was being prescribed colchicine when he did not have gout.  Nurse asked Dr. Jarold Song who said the medication was being prescribed for his pericarditis to help reduce inflammation and that the patient did need to pick up the Rx from our pharmacy.  Patient verbalized understanding and said he would pick up medication and start taking.

## 2015-02-20 ENCOUNTER — Ambulatory Visit: Payer: Medicaid Other

## 2015-02-20 ENCOUNTER — Ambulatory Visit: Payer: Medicaid Other | Attending: Family Medicine | Admitting: Family Medicine

## 2015-02-20 ENCOUNTER — Encounter: Payer: Self-pay | Admitting: Family Medicine

## 2015-02-20 VITALS — BP 123/79 | HR 78 | Temp 98.9°F | Resp 16 | Ht 65.5 in | Wt 220.0 lb

## 2015-02-20 DIAGNOSIS — K148 Other diseases of tongue: Secondary | ICD-10-CM | POA: Insufficient documentation

## 2015-02-20 DIAGNOSIS — Z114 Encounter for screening for human immunodeficiency virus [HIV]: Secondary | ICD-10-CM

## 2015-02-20 DIAGNOSIS — L409 Psoriasis, unspecified: Secondary | ICD-10-CM | POA: Insufficient documentation

## 2015-02-20 DIAGNOSIS — I10 Essential (primary) hypertension: Secondary | ICD-10-CM | POA: Diagnosis not present

## 2015-02-20 DIAGNOSIS — N189 Chronic kidney disease, unspecified: Secondary | ICD-10-CM | POA: Diagnosis not present

## 2015-02-20 DIAGNOSIS — R079 Chest pain, unspecified: Secondary | ICD-10-CM | POA: Diagnosis not present

## 2015-02-20 DIAGNOSIS — M069 Rheumatoid arthritis, unspecified: Secondary | ICD-10-CM | POA: Insufficient documentation

## 2015-02-20 DIAGNOSIS — E1165 Type 2 diabetes mellitus with hyperglycemia: Secondary | ICD-10-CM | POA: Diagnosis not present

## 2015-02-20 LAB — GLUCOSE, POCT (MANUAL RESULT ENTRY): POC Glucose: 321 mg/dl — AB (ref 70–99)

## 2015-02-20 MED ORDER — INSULIN NPH ISOPHANE & REGULAR (70-30) 100 UNIT/ML ~~LOC~~ SUSP: 15.0000 [IU] | Freq: Two times a day (BID) | SUBCUTANEOUS | Status: DC

## 2015-02-20 MED ORDER — INSULIN DETEMIR 100 UNIT/ML ~~LOC~~ SOLN: 60.0000 [IU] | Freq: Every day | SUBCUTANEOUS | Status: DC

## 2015-02-20 MED ORDER — PREDNISONE 10 MG PO TABS: 10.0000 mg | ORAL_TABLET | Freq: Every day | ORAL | Status: DC

## 2015-02-20 MED ORDER — CLOTRIMAZOLE 10 MG MT TROC: 10.0000 mg | Freq: Every day | OROMUCOSAL | Status: DC

## 2015-02-20 NOTE — Progress Notes (Signed)
   Subjective:    Patient ID: Marco [name], male    DOB: 09-Apr-1956, 59 y.o.   MRN: [id number] CC: DM2 follow up, rheumatoid arthritis, chest pain  HPI 59 yo M presents for f/u visit  1. Chest pain: x one year. Worsening sharp pains in chest associated with SOB and dizziness at times. Patient has most recently been started on colchicine for CP w/o improvement. Has has hx of GERD and gastric ulcer, he is on zantac. He has dry mouth and pain with swallowing as well. Restarting prednisone at 20 mg daily dose has not improved his CP. Cardiology referral has been placed. Recent ECHO normal except for LVH and grade 1 diastolic dysfunction, recent CTA negative for PE, apical blebs. Recent EKG normal. Patient has had elevated trop 2 months ago that have since normalized.   2. Rheumatoid arthritis and psoriasis: taking prednisone 10 mg BID. Still with joint pain and stiffness as well as peeling of palms and soles. Worsening hyperglycemia. rheumatology referral has been placed.   3. DM2; patient has always been poorly controlled with his diabetes. He eats 2 meals a day on average. His fasting sugars are 150-170. His post prandial sugars are in the 200s and 300s. He reports poor appetite and poor oral intake over the past two weeks due to throat pain, bad taste in his mouth and trouble swallowing.   4. Stool problem: patient with one year of stooling with urination. He denies fecal incontinence. He reports that the urge to stool come suddenly during urination. He denies blood in stool. He has a hx of partial colon resection with polypectomy in 09/2013. This was reportedly done in [place].   Soc Hx: non smoker Med Hx: DM2, rheumatoid arthritis, HTN, LVH with grade 1 diastolic dysfunction    Review of Systems  Constitutional: Negative for fever, chills, fatigue and unexpected weight change.  HENT: Positive for sore throat and trouble swallowing.   Eyes: Negative for visual disturbance.  Respiratory:  Positive for shortness of breath. Negative for cough.   Cardiovascular: Positive for chest pain. Negative for palpitations and leg swelling.  Gastrointestinal: Negative for nausea, vomiting, abdominal pain, diarrhea, constipation and blood in stool.  Musculoskeletal: Negative for myalgias, back pain, arthralgias, gait problem and neck pain.  Skin: Negative for rash.  Neurological: Positive for dizziness.       Objective:   Physical Exam BP 123/79 mmHg  Pulse 78  Temp(Src) 98.9 F (37.2 C) (Oral)  Resp 16  Ht 5' 5.5" (1.664 m)  Wt 220 lb (99.791 kg)  BMI 36.04 kg/m2  SpO2 95% General appearance: alert, cooperative, no distress and morbidly obese Throat: normal findings: oropharynx pink & moist without lesions or evidence of thrush and abnormal findings: tongue white with dry mucus membranes  very few teeth  Back: diffuse soft tissue tenderness to light touch  Lungs: clear to auscultation bilaterally Heart: S1S2, RRR, no murmurs, rubs or gallops  Extremities: extremities normal, atraumatic, no cyanosis or edema Skin: dry and flaky on palms   Lab Results  Component Value Date   HGBA1C 12.6* 12/26/2014   CBG 321      Assessment & Plan:

## 2015-02-20 NOTE — Patient Instructions (Addendum)
Marco [name],  Thank you for coming in today. It was a pleasure meeting you. I look forward to being your primary doctor.  1. Rheumatoid arthritis: Decrease prednisone to 10 mg once daily   2. Psoriasis: Use vaseline or eurcerin on hand multiple times a day for flakiness   3. Diabetes: Decrease prednisone from 20 to 10 mg once daily  Decrease levemir to 60 mg nightly STOP: apidra and [place]  START: humulin 70/30 insulin 15 U twice daily  Drink plenty of water  Diabetes blood sugar goals  Fasting (in AM before breakfast, 8 hrs of no eating or drinking (except water or unsweetened coffee or tea): 90-110 2 hrs after meals: < 160,   No low sugars: nothing < 70    4. Chest pain:  Normal CT chest ECHO revealed mild enlargement of L side of heart and slight filling dysfunction No improvement with zantac, prednisone, colchicine Most recent EKG did not reveal heart attack Most recent troponin were normal  At this point, you have been referred to cardiology and your appt has been moved up to June 20,2016, earliest available at this time. You are welcome to call and inquire if there have been any cancellations.   5. White tongue with painful swallowing: I am not 100 % convinced of thrush but I have ordered clotrimazole just in case as oral prednisone and diabetes does increase your risk of fungal infections.  F/u in 3 weeks with RN for blood sugar check F/u with me in 6 weeks  Dr. Adrian Blackwater

## 2015-02-20 NOTE — Progress Notes (Signed)
F/U DM Com[plining of chest pain, SHOB sharp x 1 year. worsen in the past two week  Pain stronger when deep breath     Pt requesting letter stated pt was unable to travel  in June 25 due to visit to ER and Dx Letter also need to have information stating if  pt can or can not  travel within the next 90 days.

## 2015-02-20 NOTE — Assessment & Plan Note (Addendum)
A; white tongue without oropharynx lesions P: clortiamzole

## 2015-02-21 ENCOUNTER — Telehealth: Payer: Self-pay

## 2015-02-21 DIAGNOSIS — E1165 Type 2 diabetes mellitus with hyperglycemia: Secondary | ICD-10-CM

## 2015-02-21 LAB — HIV ANTIBODY (ROUTINE TESTING W REFLEX): HIV: NONREACTIVE

## 2015-02-21 NOTE — Assessment & Plan Note (Signed)
A: Chest pain:  Chronic with no definite etiology. DDx gastritis, esophagitis, pericarditis. There has been no improvement with the addition of prednisone or colchicine and patient is compliant with H2 blocker.  P: At this point, you have been referred to cardiology and your appt has been moved up to June 20,2016, earliest available at this time. You are welcome to call and inquire if there have been any cancellations.

## 2015-02-21 NOTE — Assessment & Plan Note (Signed)
A: Rheumatoid arthritis: chronic w/o acute flare P: Decrease prednisone to 10 mg once daily

## 2015-02-21 NOTE — Assessment & Plan Note (Signed)
Screening HIV normal

## 2015-02-21 NOTE — Assessment & Plan Note (Signed)
A:Diabetes: uncontrolled. On prednisone.  P: Decrease prednisone from 20 to 10 mg once daily  Decrease levemir to 60 mg nightly STOP: apidra and [place]  START: humulin 70/30 insulin 15 U twice daily  Drink plenty of water  Diabetes blood sugar goals  Fasting (in AM before breakfast, 8 hrs of no eating or drinking (except water or unsweetened coffee or tea): 90-110 2 hrs after meals: < 160,   No low sugars: nothing < 70

## 2015-02-21 NOTE — Telephone Encounter (Signed)
Patient called to get clarification on the medications that were prescribed yesterday. Patient also stated that his discharge paperwork said to stop a medication but he was not told to do so and wanted to know if he needs to keep taking the medication. Please f/u with pt.

## 2015-02-22 LAB — BASIC METABOLIC PANEL
BUN: 20 mg/dL (ref 6–23)
CO2: 29 mEq/L (ref 19–32)
CREATININE: 2.07 mg/dL — AB (ref 0.50–1.35)
Calcium: 9.8 mg/dL (ref 8.4–10.5)
Chloride: 97 mEq/L (ref 96–112)
Glucose, Bld: 349 mg/dL — ABNORMAL HIGH (ref 70–99)
POTASSIUM: 4.3 meq/L (ref 3.5–5.3)
Sodium: 143 mEq/L (ref 135–145)

## 2015-02-22 MED ORDER — "INSULIN SYRINGE-NEEDLE U-100 31G X 5/16"" 1 ML MISC": 1.0000 | Freq: Every day | Status: DC

## 2015-02-22 MED ORDER — "INSULIN SYRINGE-NEEDLE U-100 31G X 5/16"" 0.5 ML MISC": 1.0000 | Freq: Two times a day (BID) | Status: DC

## 2015-02-22 NOTE — Telephone Encounter (Signed)
Please inform patient  Yes, stop Tonga as well Needles and syringes ordered

## 2015-02-23 MED ORDER — INSULIN PEN NEEDLE 31G X 8 MM MISC: 1.0000 "application " | Freq: Every day | Status: DC

## 2015-02-23 MED ORDER — INSULIN DETEMIR 100 UNIT/ML FLEXPEN: 60.0000 [IU] | PEN_INJECTOR | Freq: Every day | SUBCUTANEOUS | Status: DC

## 2015-02-23 MED ORDER — INSULIN ASPART PROT & ASPART (70-30 MIX) 100 UNIT/ML PEN: 15.0000 [IU] | PEN_INJECTOR | Freq: Two times a day (BID) | SUBCUTANEOUS | Status: DC

## 2015-02-23 NOTE — Telephone Encounter (Signed)
Pt aware stop Tonga  Insulin 70/30 and Levemier flexpen order

## 2015-02-23 NOTE — Addendum Note (Signed)
Addended by: Boykin Nearing on: 02/23/2015 03:04 PM   Modules accepted: Orders, Medications

## 2015-02-26 ENCOUNTER — Telehealth: Payer: Self-pay | Admitting: *Deleted

## 2015-02-26 NOTE — Telephone Encounter (Signed)
-----   Message from Boykin Nearing, MD sent at 02/21/2015  9:26 AM EDT ----- Screening HIV negative

## 2015-02-26 NOTE — Telephone Encounter (Signed)
-----   Message from Boykin Nearing, MD sent at 02/23/2015  8:38 AM EDT ----- Increased Cr  Elevated blood sugar  Continue current care plan to improve BP and blood sugar control Close f/u with renal,

## 2015-02-28 ENCOUNTER — Encounter: Payer: Self-pay | Admitting: Nurse Practitioner

## 2015-02-28 ENCOUNTER — Ambulatory Visit (INDEPENDENT_AMBULATORY_CARE_PROVIDER_SITE_OTHER): Payer: Medicaid Other | Admitting: Nurse Practitioner

## 2015-02-28 VITALS — BP 110/78 | HR 81 | Ht 65.5 in | Wt 220.0 lb

## 2015-02-28 DIAGNOSIS — E119 Type 2 diabetes mellitus without complications: Secondary | ICD-10-CM

## 2015-02-28 DIAGNOSIS — G8929 Other chronic pain: Secondary | ICD-10-CM

## 2015-02-28 DIAGNOSIS — E118 Type 2 diabetes mellitus with unspecified complications: Secondary | ICD-10-CM | POA: Insufficient documentation

## 2015-02-28 DIAGNOSIS — I480 Paroxysmal atrial fibrillation: Secondary | ICD-10-CM

## 2015-02-28 DIAGNOSIS — Z794 Long term (current) use of insulin: Secondary | ICD-10-CM | POA: Insufficient documentation

## 2015-02-28 DIAGNOSIS — I2510 Atherosclerotic heart disease of native coronary artery without angina pectoris: Secondary | ICD-10-CM

## 2015-02-28 NOTE — Patient Instructions (Signed)
Medication Instructions:  Your physician recommends that you continue on your current medications as directed. Please refer to the Current Medication list given to you today.   Labwork: None  Testing/Procedures: None  Follow-Up: Your physician wants you to follow-up in: 1 year with Dr. Turner. You will receive a reminder letter in the mail two months in advance. If you don't receive a letter, please call our office to schedule the follow-up appointment.   Any Other Special Instructions Will Be Listed Below (If Applicable).   

## 2015-02-28 NOTE — Progress Notes (Addendum)
Patient Name: Marco [name] Date of Encounter: 02/28/2015  Primary Care Provider:  Pcp Not In System Primary Cardiologist:  T. [name], MD   Chief Complaint  59 year old male with a history of chronic chest pain and prior negative evaluations who presents for follow-up after recent hospitalization.  Past Medical History   Past Medical History  Diagnosis Date  . Non-obstructive CAD     a. 02/2013 Cath ([place]): nonobs dzs;  b. 09/2014 Myoview ([place]): EF 55%, no ischemia;  c. 12/2014 Echo: EF 50-55%, gr1 DD, no effusion.  . Essential hypertension   . Diabetes mellitus without complication   . Rheumatoid arthritis   . Chronic pain   . PAF (paroxysmal atrial fibrillation)     a. 02/2013 s/p rfca in [place], [place]-->prev on Xarelto, d/c'd 2/2 anemia, ? GIB.  [name] Colon polyp     a. s/p partial colectomy in [place], [place]  . Morbid obesity    Past Surgical History  Procedure Laterality Date  . Colon surgery  09/2014    colon resection   . Cardiac catheterization  05/2014    ablation for atrial fibrillation  . Colon resection  09/2013    due to large, abnormal polpy. non cancerous per patient.     Allergies  No Known Allergies  HPI  59 year old male with the above problem list. He has a history of chest pain dating back to 2014 at which time he underwent diagnostic catheterization in [place] revealing normal coronary arteries. During the same admission, he apparently had atrial fibrillation subsequent underwent A. fib ablation. He says that following ablation, he was on Xarelto for some period of time but this was subsequently discontinued secondary to anemia with subsequent finding of a large colon polyp and also gastric ulcer. He then underwent partial colectomy. He says he remained anemic following colectomy and at some point was followed by hematology. Since 2014, he has had intermittent chest discomfort and has also been treated for rheumatoid arthritis with diffuse body  aches and tenderness. He says he has been on multiple rheumatologic medications without any significant improvement in his symptoms. Regarding chest pain, he had a low risk stress test in February 2016. Approximately 4 months ago, he moved to [place]. He was admitted to [facility] in May with chest discomfort and a mild but flat troponin elevation. CT angios of the chest was negative for PE. Echo showed normal LV function with grade 1 diastolic dysfunction and no evidence of effusion. Chest pain was felt to be potentially secondary to pericarditis. MRI was recommended however patient cannot feel that he would be able to lie in the MRI machine related to claustrophobia. He was empirically placed on colchicine.  Since his discharge, he has continued to have daily, constant 2-8 out of 10 diffuse chest discomfort that is worse with position changes and light palpation. In that setting, he has not been particularly active and he does experience dyspnea with activity. He denies PND, orthopnea, dizziness, syncope, edema, or early satiety. He has been seen by primary care and also referred to rheumatology but does not have an appointment until August.  Home Medications  Prior to Admission medications   Medication Sig Start Date End Date Taking? Authorizing Provider  aspirin EC 81 MG tablet Take 81 mg by mouth daily.   Yes Historical Provider, MD  atorvastatin (LIPITOR) 40 MG tablet Take 40 mg by mouth daily.   Yes Historical Provider, MD  carvedilol (COREG) 12.5 MG  tablet Take 1 tablet (12.5 mg total) by mouth 2 (two) times daily. 02/05/15  Yes Arnoldo Morale, MD  clotrimazole (MYCELEX) 10 MG troche Take 1 tablet (10 mg total) by mouth 5 (five) times daily. 02/20/15  Yes Josalyn Funches, MD  colchicine 0.6 MG tablet Take 1 tablet (0.6 mg total) by mouth 2 (two) times daily. 02/06/15  Yes Arnoldo Morale, MD  diltiazem (DILACOR XR) 180 MG 24 hr capsule Take 180 mg by mouth daily.    Yes Historical Provider, MD    furosemide (LASIX) 40 MG tablet Take 40 mg by mouth daily.   Yes Historical Provider, MD  gabapentin (NEURONTIN) 600 MG tablet Take 600 mg by mouth 3 (three) times daily.   Yes Historical Provider, MD  insulin aspart protamine - aspart (NOVOLOG MIX 70/30 FLEXPEN) (70-30) 100 UNIT/ML FlexPen Inject 0.15 mLs (15 Units total) into the skin 2 (two) times daily with a meal. 02/23/15  Yes Josalyn Funches, MD  Insulin Detemir (LEVEMIR FLEXPEN) 100 UNIT/ML Pen Inject 60 Units into the skin daily at 10 pm. 02/23/15  Yes Josalyn Funches, MD  Insulin Pen Needle (B-D ULTRAFINE III SHORT PEN) 31G X 8 MM MISC 1 application by Does not apply route daily. 02/23/15  Yes Josalyn Funches, MD  lisinopril (PRINIVIL,ZESTRIL) 30 MG tablet Take 30 mg by mouth daily.   Yes Historical Provider, MD  pantoprazole (PROTONIX) 40 MG tablet Take 40 mg by mouth 2 (two) times daily.   Yes Historical Provider, MD  Polyvinyl Alcohol-Povidone (REFRESH OP) Place 1 drop into both eyes 3 (three) times daily as needed (dry eyes).   Yes Historical Provider, MD  predniSONE (DELTASONE) 10 MG tablet Take 1 tablet (10 mg total) by mouth daily with breakfast. 02/20/15  Yes Josalyn Funches, MD  ranitidine (ZANTAC) 150 MG capsule Take 150 mg by mouth 2 (two) times daily.   Yes Historical Provider, MD  sildenafil (VIAGRA) 100 MG tablet Take 100 mg by mouth daily as needed for erectile dysfunction.   Yes Historical Provider, MD    Review of Systems  As above, he continues have diffuse body aches and tenderness including his chest wall.  He does have dyspnea on exertion. He denies PND, orthopnea, dizziness, syncope, edema, or early satiety.All other systems reviewed and are otherwise negative except as noted above.  Physical Exam  VS:  BP 110/78 mmHg  Pulse 81  Ht 5' 5.5" (1.664 m)  Wt 220 lb (99.791 kg)  BMI 36.04 kg/m2  SpO2 98% , BMI Body mass index is 36.04 kg/(m^2). GEN: Well nourished, well developed, in no acute distress. HEENT:  normal. Neck: Supple, no JVD, carotid bruits, or masses. Cardiac: RRR, no murmurs, rubs, or gallops. No clubbing, cyanosis, edema.  Radials/DP/PT 2+ and equal bilaterally.  Chest wall: He is tender to light palpation over the majority of his chest wall from the left midclavicular line to the right shoulder. Discomfort is also reproducible with upper extremity position changes, and deep breathing.  Respiratory:  Respirations regular and unlabored, clear to auscultation bilaterally. GI: Soft, nontender, nondistended, BS + x 4. MS: no deformity or atrophy. Skin: warm and dry, no rash. Neuro:  Strength and sensation are intact. Psych: Normal affect.  Accessory Clinical Findings  ECG - regular sinus rhythm, 81, ST flattening in inferior leads with mild depression and T changes in V3 through V6, which have been seen previously.  Assessment & Plan  1. Precordial chest pain: Patient has a two-year history of intermittent chest pain associated  with chest wall tenderness. There is also a pleuritic component to this pain. He has had prior evaluation in July 2014 with a normal catheterization and subsequently a low-risk Myoview in 22,016. More recently, he had a negative CT angiogram of his chest. Echocardiogram in May of this year showed normal LV function without wall motion abnormalities. His chest pain is easily reproducible with light palpation on exam. I do not believe that his chest pain is cardiac in origin. He has not had any significant relief from colchicine or a higher dose of prednisone. Prednisone dose was recently reduced by his primary care provider. Have advised that if colchicine has not helped, he can stop it. I have recommended follow-up with rheumatology and also primary care. Ultimately he may require a referral to pain clinic as he truly has diffuse body aches and tenderness.  2.  Essential hypertension: Stable. Continue calcium channel blocker, beta blocker, and lisinopril  therapy.  3. Paroxysmal atrial fibrillation: Patient says that this was diagnosed in July 2014 and he subsequently underwent catheter ablation in [place]. He is in sinus rhythm. He remains on diltiazem and carvedilol. He was previously on xarelto but this was discontinued in the setting of anemia and based on his description, probable GI bleed. He is currently on aspirin therapy. CHA2DS2VASc equals 2.  4. Morbid obesity: Activities currently limited secondary to diffuse body aches and pains.  5. Rheumatoid arthritis: Suspect that this is playing a role in his diffuse body aches including chest wall pain. He has follow-up with rheumatology in August.  6. Type II DM:  Followed closely by IM.  7. Disposition: Follow-up with [name] in 6-12 months or sooner if necessary.    Murray Hodgkins, NP 02/28/2015, 1:29 PM

## 2015-02-28 NOTE — Telephone Encounter (Signed)
LVM  To return

## 2015-02-28 NOTE — Telephone Encounter (Signed)
Pt aware of results 

## 2015-02-28 NOTE — Telephone Encounter (Signed)
Pt returning call

## 2015-03-01 ENCOUNTER — Telehealth: Payer: Self-pay | Admitting: *Deleted

## 2015-03-01 ENCOUNTER — Encounter: Payer: Self-pay | Admitting: Family Medicine

## 2015-03-01 ENCOUNTER — Encounter: Payer: Self-pay | Admitting: Physician Assistant

## 2015-03-01 ENCOUNTER — Ambulatory Visit: Payer: Medicaid Other | Attending: Family Medicine | Admitting: Family Medicine

## 2015-03-01 VITALS — BP 116/82 | HR 80 | Temp 98.0°F | Resp 16 | Ht 66.0 in | Wt 219.0 lb

## 2015-03-01 DIAGNOSIS — Z794 Long term (current) use of insulin: Secondary | ICD-10-CM | POA: Diagnosis not present

## 2015-03-01 DIAGNOSIS — M069 Rheumatoid arthritis, unspecified: Secondary | ICD-10-CM | POA: Diagnosis not present

## 2015-03-01 DIAGNOSIS — K148 Other diseases of tongue: Secondary | ICD-10-CM

## 2015-03-01 DIAGNOSIS — K279 Peptic ulcer, site unspecified, unspecified as acute or chronic, without hemorrhage or perforation: Secondary | ICD-10-CM | POA: Diagnosis not present

## 2015-03-01 DIAGNOSIS — E1165 Type 2 diabetes mellitus with hyperglycemia: Secondary | ICD-10-CM | POA: Diagnosis not present

## 2015-03-01 DIAGNOSIS — G8929 Other chronic pain: Secondary | ICD-10-CM | POA: Diagnosis not present

## 2015-03-01 LAB — GLUCOSE, POCT (MANUAL RESULT ENTRY): POC GLUCOSE: 141 mg/dL — AB (ref 70–99)

## 2015-03-01 MED ORDER — ACETAMINOPHEN-CODEINE #3 300-30 MG PO TABS: 1.0000 | ORAL_TABLET | Freq: Three times a day (TID) | ORAL | Status: DC | PRN

## 2015-03-01 NOTE — Progress Notes (Signed)
F/U Chest pain, SHOB No Hx tobacco  Requesting GI referral,Traveling note

## 2015-03-01 NOTE — Patient Instructions (Signed)
Mr. Kolenovic,  Thank you for coming in today  1. Rheumatoid arthritis: Continue current dose of prednisone Tylenol #3 added for chronic pain management Keep rheumatology follow up  2. Peptic ulcer Will request previous records GI referral placed   3. Diabetes: Blood sugar well controlled  Continue current regimen   4. Missed flight: letter provided   F/u in 1 months for chronic pain follow up  Dr. Adrian Blackwater

## 2015-03-01 NOTE — Progress Notes (Signed)
   Subjective:    Patient ID: Marco [name], male    DOB: 1956/01/03, 59 y.o.   MRN: [id number] CC: f/u cardiology appt, reporting shortness of breath  HPI 59 yo M with chronic pain and rheumatoid arthritis:  1. Chronic pain: in chest and shoulders. Associated with SOB. Saw cardiology yesterday. Not believed to be cardiac CP. Taking prednisone 10 mg daily with no worsening of CP. Taking PPI.   2. Diabetes: improved CBGs since decreasing prednisone. Compliant with levemir and novololg 70/30. No low CBGs.   3. Request letter: missed a flight at end of June due to severe pain resulting in ED visit x 2. Requesting letter for airlines. Patient feels like he can fly now.   4. Peptic ulcers: dx in [place]. On PPI. Requesting f/u GI appt. Has chronic epigastric pain.   Soc Hx: non smoker  Review of Systems GAD-7: score of 3. 1-2, 2-3 and 4.     Objective:   Physical Exam BP 116/82 mmHg  Pulse 80  Temp(Src) 98 F (36.7 C) (Oral)  Resp 16  Ht 5\' 6"  (1.676 m)  Wt 219 lb (99.338 kg)  BMI 35.36 kg/m2  SpO2 96%  Wt Readings from Last 3 Encounters:  03/01/15 219 lb (99.338 kg)  02/28/15 220 lb (99.791 kg)  02/20/15 220 lb (99.791 kg)  General appearance: alert, cooperative and no distress Lungs: clear to auscultation bilaterally Heart: regular rate and rhythm, S1, S2 normal, no murmur, click, rub or gallop Extremities: extremities normal, atraumatic, no cyanosis or edema  Skin: scaling on palms and soles   Lab Results  Component Value Date   HGBA1C 12.6* 12/26/2014   CBG 144      Assessment & Plan:

## 2015-03-02 MED ORDER — MICONAZOLE 50 MG BU TABS: 1.0000 | ORAL_TABLET | Freq: Every day | BUCCAL | Status: DC

## 2015-03-02 NOTE — Telephone Encounter (Signed)
Pt aware.

## 2015-03-02 NOTE — Telephone Encounter (Signed)
Sent in miconazole to replace clortrimazole please inform patient.

## 2015-03-02 NOTE — Assessment & Plan Note (Signed)
Peptic ulcer Will request previous records GI referral placed

## 2015-03-02 NOTE — Assessment & Plan Note (Signed)
Rheumatoid arthritis: Continue current dose of prednisone Tylenol #3 added for chronic pain management Keep rheumatology follow up

## 2015-03-02 NOTE — Assessment & Plan Note (Signed)
Diabetes: Blood sugar well controlled  Continue current regimen

## 2015-03-05 ENCOUNTER — Ambulatory Visit: Payer: Medicaid - Out of State

## 2015-03-06 ENCOUNTER — Telehealth: Payer: Self-pay | Admitting: *Deleted

## 2015-03-06 ENCOUNTER — Other Ambulatory Visit: Payer: Self-pay | Admitting: *Deleted

## 2015-03-06 DIAGNOSIS — K148 Other diseases of tongue: Secondary | ICD-10-CM

## 2015-03-06 MED ORDER — NYSTATIN 100000 UNIT/ML MT SUSP: 5.0000 mL | Freq: Four times a day (QID) | OROMUCOSAL | Status: DC

## 2015-03-06 NOTE — Telephone Encounter (Signed)
[facility] called to request clarification for the patient. Please f/u

## 2015-03-06 NOTE — Telephone Encounter (Signed)
Pharmacy stated Rx Miconazole not available Nystatin order  LVM to return call

## 2015-03-12 ENCOUNTER — Encounter: Payer: Medicaid - Out of State | Admitting: Physician Assistant

## 2015-03-16 ENCOUNTER — Other Ambulatory Visit: Payer: Self-pay | Admitting: *Deleted

## 2015-03-16 ENCOUNTER — Ambulatory Visit (INDEPENDENT_AMBULATORY_CARE_PROVIDER_SITE_OTHER): Payer: Medicaid Other | Admitting: Physician Assistant

## 2015-03-16 ENCOUNTER — Encounter: Payer: Self-pay | Admitting: Physician Assistant

## 2015-03-16 ENCOUNTER — Other Ambulatory Visit (INDEPENDENT_AMBULATORY_CARE_PROVIDER_SITE_OTHER): Payer: Medicaid Other

## 2015-03-16 ENCOUNTER — Other Ambulatory Visit (HOSPITAL_COMMUNITY): Payer: Self-pay | Admitting: Physician Assistant

## 2015-03-16 ENCOUNTER — Ambulatory Visit: Payer: Medicaid Other | Attending: Family Medicine | Admitting: Pharmacist

## 2015-03-16 ENCOUNTER — Telehealth: Payer: Self-pay | Admitting: Family Medicine

## 2015-03-16 VITALS — BP 160/90 | HR 72 | Ht 66.0 in | Wt 223.0 lb

## 2015-03-16 DIAGNOSIS — R197 Diarrhea, unspecified: Secondary | ICD-10-CM

## 2015-03-16 DIAGNOSIS — R1314 Dysphagia, pharyngoesophageal phase: Secondary | ICD-10-CM

## 2015-03-16 DIAGNOSIS — R079 Chest pain, unspecified: Secondary | ICD-10-CM

## 2015-03-16 DIAGNOSIS — R1310 Dysphagia, unspecified: Secondary | ICD-10-CM

## 2015-03-16 DIAGNOSIS — Z794 Long term (current) use of insulin: Secondary | ICD-10-CM | POA: Insufficient documentation

## 2015-03-16 DIAGNOSIS — K219 Gastro-esophageal reflux disease without esophagitis: Secondary | ICD-10-CM

## 2015-03-16 DIAGNOSIS — E876 Hypokalemia: Secondary | ICD-10-CM

## 2015-03-16 DIAGNOSIS — Z86010 Personal history of colonic polyps: Secondary | ICD-10-CM | POA: Diagnosis not present

## 2015-03-16 DIAGNOSIS — E1165 Type 2 diabetes mellitus with hyperglycemia: Secondary | ICD-10-CM

## 2015-03-16 LAB — CBC WITH DIFFERENTIAL/PLATELET
BASOS ABS: 0 10*3/uL (ref 0.0–0.1)
Basophils Relative: 0.2 % (ref 0.0–3.0)
EOS ABS: 0.2 10*3/uL (ref 0.0–0.7)
Eosinophils Relative: 1.7 % (ref 0.0–5.0)
HCT: 44.3 % (ref 39.0–52.0)
HEMOGLOBIN: 14.5 g/dL (ref 13.0–17.0)
LYMPHS ABS: 2.8 10*3/uL (ref 0.7–4.0)
Lymphocytes Relative: 25.3 % (ref 12.0–46.0)
MCHC: 32.8 g/dL (ref 30.0–36.0)
MCV: 92 fl (ref 78.0–100.0)
Monocytes Absolute: 1.4 10*3/uL — ABNORMAL HIGH (ref 0.1–1.0)
Monocytes Relative: 12.3 % — ABNORMAL HIGH (ref 3.0–12.0)
NEUTROS PCT: 60.5 % (ref 43.0–77.0)
Neutro Abs: 6.8 10*3/uL (ref 1.4–7.7)
Platelets: 222 10*3/uL (ref 150.0–400.0)
RBC: 4.81 Mil/uL (ref 4.22–5.81)
RDW: 15.1 % (ref 11.5–15.5)
WBC: 11.2 10*3/uL — ABNORMAL HIGH (ref 4.0–10.5)

## 2015-03-16 LAB — COMPREHENSIVE METABOLIC PANEL
ALT: 28 U/L (ref 0–53)
AST: 16 U/L (ref 0–37)
Albumin: 3.9 g/dL (ref 3.5–5.2)
Alkaline Phosphatase: 40 U/L (ref 39–117)
BUN: 11 mg/dL (ref 6–23)
CO2: 33 meq/L — AB (ref 19–32)
CREATININE: 1.08 mg/dL (ref 0.40–1.50)
Calcium: 9.4 mg/dL (ref 8.4–10.5)
Chloride: 101 mEq/L (ref 96–112)
GFR: 89.65 mL/min (ref >60.00)
Glucose, Bld: 142 mg/dL — ABNORMAL HIGH (ref 70–99)
Potassium: 3.2 mEq/L — ABNORMAL LOW (ref 3.5–5.1)
Sodium: 144 mEq/L (ref 135–145)
TOTAL PROTEIN: 7 g/dL (ref 6.0–8.3)
Total Bilirubin: 0.6 mg/dL (ref 0.2–1.2)

## 2015-03-16 LAB — IGA: IgA: 318 mg/dL (ref 68–378)

## 2015-03-16 LAB — GLUCOSE, POCT (MANUAL RESULT ENTRY): POC Glucose: 291 mg/dl — AB (ref 70–99)

## 2015-03-16 LAB — LIPASE: Lipase: 33 U/L (ref 11.0–59.0)

## 2015-03-16 MED ORDER — RANITIDINE HCL 150 MG PO CAPS: 150.0000 mg | ORAL_CAPSULE | Freq: Two times a day (BID) | ORAL | Status: DC

## 2015-03-16 MED ORDER — HYOSCYAMINE SULFATE 0.125 MG SL SUBL: 0.1250 mg | SUBLINGUAL_TABLET | Freq: Four times a day (QID) | SUBLINGUAL | Status: DC | PRN

## 2015-03-16 MED ORDER — PANTOPRAZOLE SODIUM 40 MG PO TBEC: 40.0000 mg | DELAYED_RELEASE_TABLET | Freq: Two times a day (BID) | ORAL | Status: DC

## 2015-03-16 NOTE — Patient Instructions (Signed)
It was nice to see you today.  Take your insulin right before you eat.  Take your blood sugar two hours after you eat so we can see some of those numbers.  Come back to see the nurse or pharmacist in two weeks.

## 2015-03-16 NOTE — Telephone Encounter (Signed)
Pt is requesting a refill on nystatin (MYCOSTATIN) 100000 UNIT/ML suspension. Please follow up with pt. Thank you.

## 2015-03-16 NOTE — Progress Notes (Signed)
S:    Patient arrives in good spirits.    Presents for diabetes/blood glucose follow up.   Patient reports adherence with medications. Current diabetes medications include insulin aspart (70/30) and insulin detemir. He reports that he takes the aspart after meals.   Patient denies hypoglycemic events.  Patient reported dietary habits: patient eats two large meals a day.  O:  . Lab Results  Component Value Date   HGBA1C 12.6* 12/26/2014    POCT glucose = 291 mg/dL  Patient did not bring meter or log book with him to visit.   Home fasting CBG: 120-130s (per report) 2 hour post-prandial/random CBG: none.  A/P: Diabetes currently uncontrolled based on A1c of 12.6 and POCT glucose of 291. Denies hypoglycemic events and is able to verbalize appropriate hypoglycemia management plan.  reports adherence with medication. Control is suboptimal due to dietary indiscretion and lack of physical activity. Will not make any changes to insulin until we have more post-prandial readings as I suspect that they are high. Instructed patient to get readings after 1 meal a day 2 hours after that meal. He will bring his meter to the next visit. Also instructed patient to take the Novolog prior to eating instead of after so that the peak of the insulin matches up better with the peak of the glucose. Next A1C anticipated August 2016 (next visit).  Written patient instructions provided.  Follow up in pharmacist or nurse visit in two weeks for blood glucose log follow up.   Total time in face to face counseling 10 minutes.

## 2015-03-16 NOTE — Patient Instructions (Signed)
You have been scheduled for a modified barium swallow on 03/22/2015 11:30am at [facility] . Please arrive 15 minutes prior to your test for registration. You will go to [facility] (1st Floor) for your appointment. Please refrain from eating or drinking anything 4 hours prior to your test. Should you need to cancel or reschedule your appointment, please contact [phone] [facility]) or [phone] [place]). _____________________________________________________________________ A Modified Barium Swallow Study, or MBS, is a special x-ray that is taken to check swallowing skills. It is carried out by a Stage manager and a Psychologist, clinical (SLP). During this test, yourmouth, throat, and esophagus, a muscular tube which connects your mouth to your stomach, is checked. The test will help you, your doctor, and the SLP plan what types of foods and liquids are easier for you to swallow. The SLP will also identify positions and ways to help you swallow more easily and safely. What will happen during an MBS? You will be taken to an x-ray room and seated comfortably. You will be asked to swallow small amounts of food and liquid mixed with barium. Barium is a liquid or paste that allows images of your mouth, throat and esophagus to be seen on x-ray. The x-ray captures moving images of the food you are swallowing as it travels from your mouth through your throat and into your esophagus. This test helps identify whether food or liquid is entering your lungs (aspiration). The test also shows which part of your mouth or throat lacks strength or coordination to move the food or liquid in the right direction. This test typically takes 30 minutes to 1 hour to complete. ______________________________________________________________________  Go to the basement for labs today We are sending in a prescription to your pharmacy We have sent a medical release to [facility] in [place] to get all GI records_

## 2015-03-17 LAB — CLOSTRIDIUM DIFFICILE BY PCR: CDIFFPCR: NOT DETECTED

## 2015-03-17 LAB — FECAL LACTOFERRIN, QUANT: Lactoferrin: POSITIVE

## 2015-03-18 ENCOUNTER — Encounter: Payer: Self-pay | Admitting: Physician Assistant

## 2015-03-18 NOTE — Progress Notes (Signed)
Agree with initial assessment and plans. Try to obtain pathology report from polypectomy to determine appropriate follow up interval

## 2015-03-18 NOTE — Addendum Note (Signed)
Addended by: Sherrian Divers on: 03/18/2015 06:53 PM   Modules accepted: Level of Service

## 2015-03-18 NOTE — Progress Notes (Addendum)
Patient ID: Karanvir Balderston, male   DOB: Jun 11, 1956, 59 y.o.   MRN: [id number]    HPI:  Eliasar Hlavaty is a 59 y.o.   male  referred by Boykin Nearing, MD for evaluation of chest pain.  Thane recently relocated to the [place] area from [place], [place]. He reports that he has been having intermittent chest pain for about a year. His chest pain is occasionally associated with shortness of breath. He has a history of GERD and gastric ulcers. He was recently evaluated by cardiology. He had had a prior evaluation in July 2014 in [place] within normal catheterization. More recently he had a negative CT angiogram of his chest. Echocardiogram in May of this year short normal LV function without wall motion abnormalities it was not felt that his chest pain was cardiac in origin. He had also been given a trial of colchicine with no relief and a trial of prednisone with no relief. He also has a history of paroxysmal atrial fibrillation which was diagnosed in July 2014 and he subsequently underwent catheter ablation in [place]. He is maintained on diltiazem and carvedilol. He was previously on Xarelto but this was discontinued in the setting of anemia which the patient seems to think was a GI bleed. He is currently on aspirin therapy.  Kyung Rudd denies heartburn or belching. He reports that he has been having difficulty swallowing both solids and liquids for about a year. He has to take all of his medications with applesauce and feels they get stuck. He feels food and liquids buildup in the back of his throat he coughs and sputters and often feels as if they are going down the wrong pipe. He reports that he has a history of peptic ulcer disease disease diagnosed one year ago by Dr. Christel Mormon at [facility] [place]. He states he was found to have ulcers. He is not aware if he had H pylori. He has been on pantoprazole and ranitidine. He reports that he also had a colonoscopy  several years ago at which time he had a few polyps. He states one was very large resulting in a segmental resection in February 2015 at [facility] in [place]. That since his surgery he has had diarrhea nonstop. He has mushy to loose stools daily. Whenever he eats he has to have an urgent bowel movement. He has 5 or 6 bowel movements per day. His stools are not oily. He has been experiencing some blood on the toilet tissue and states his rectum is sore. He states he has had colonoscopies every 5 years. He also has a history of C. difficile 2 years ago for which he says he had 2 rounds of treatment. His past medical history significant for hypertension, CAD, paroxysmal atrial fibrillation, peptic ulcer disease, diabetes type 2, rheumatoid arthritis, chronic kidney disease, hyperlipidemia, and obesity.   Past Medical History  Diagnosis Date  . Non-obstructive CAD     a. 02/2013 Cath ([facility]): nonobs dzs;  b. 09/2014 Myoview ([place]): EF 55%, no ischemia;  c. 12/2014 Echo: EF 50-55%, gr1 DD, no effusion.  . Essential hypertension   . Diabetes mellitus without complication   . Rheumatoid arthritis   . Chronic pain   . PAF (paroxysmal atrial fibrillation)     a. 02/2013 s/p rfca in [place], [place]-->prev on Xarelto, d/c'd 2/2 anemia, ? GIB.  [name] Colon polyp     a. s/p partial colectomy in [place]  . Morbid obesity     Past Surgical History  Procedure Laterality Date  . Colon surgery  09/2014    colon resection   . Cardiac catheterization  05/2014    ablation for atrial fibrillation  . Colon resection  09/2013    due to large, abnormal polpy. non cancerous per patient.    Family History  Problem Relation Age of Onset  . Hypertension Mother   . Diabetes Mother   . Cancer Mother   . COPD Mother   . Hypertension Father   . Diabetes Father   . Heart Problems Father   . COPD Father    History  Substance Use Topics  . Smoking status: Never Smoker   . Smokeless tobacco:  Not on file  . Alcohol Use: No   Current Outpatient Prescriptions  Medication Sig Dispense Refill  . acetaminophen-codeine (TYLENOL #3) 300-30 MG per tablet Take 1 tablet by mouth every 8 (eight) hours as needed for moderate pain. 60 tablet 0  . aspirin EC 81 MG tablet Take 81 mg by mouth daily.    [name] atorvastatin (LIPITOR) 40 MG tablet Take 40 mg by mouth daily.    [name] diltiazem (DILACOR XR) 180 MG 24 hr capsule Take 180 mg by mouth daily.     . furosemide (LASIX) 40 MG tablet Take 40 mg by mouth daily.    [name] gabapentin (NEURONTIN) 600 MG tablet Take 600 mg by mouth 3 (three) times daily.    . insulin aspart protamine - aspart (NOVOLOG MIX 70/30 FLEXPEN) (70-30) 100 UNIT/ML FlexPen Inject 0.15 mLs (15 Units total) into the skin 2 (two) times daily with a meal. 15 mL 11  . Insulin Detemir (LEVEMIR FLEXPEN) 100 UNIT/ML Pen Inject 60 Units into the skin daily at 10 pm. 15 mL 11  . Insulin Pen Needle (B-D ULTRAFINE III SHORT PEN) 31G X 8 MM MISC 1 application by Does not apply route daily. 100 each 3  . lisinopril (PRINIVIL,ZESTRIL) 30 MG tablet Take 30 mg by mouth daily.    [name] nystatin (MYCOSTATIN) 100000 UNIT/ML suspension Take 5 mLs (500,000 Units total) by mouth 4 (four) times daily. 60 mL 0  . predniSONE (DELTASONE) 10 MG tablet Take 1 tablet (10 mg total) by mouth daily with breakfast. 30 tablet 2  . sildenafil (VIAGRA) 100 MG tablet Take 100 mg by mouth daily as needed for erectile dysfunction.    . carvedilol (COREG) 12.5 MG tablet Take 12.5 mg by mouth 2 (two) times daily with a meal.    . hyoscyamine (LEVSIN SL) 0.125 MG SL tablet Place 1 tablet (0.125 mg total) under the tongue every 6 (six) hours as needed. 90 tablet 3  . pantoprazole (PROTONIX) 40 MG tablet Take 1 tablet (40 mg total) by mouth 2 (two) times daily. 60 tablet 4  . ranitidine (ZANTAC) 150 MG capsule Take 1 capsule (150 mg total) by mouth 2 (two) times daily. 60 capsule 3   No current facility-administered medications for  this visit.   No Known Allergies   Review of Systems: Gen: Denies any fever, chills, sweats, anorexia, fatigue, weakness, malaise, weight loss, and sleep disorder CV: Admits to intermittent chest pain. Resp: Denies dyspnea at rest, dyspnea with exercise, cough, sputum, wheezing, coughing up blood, and pleurisy. GI: Denies vomiting blood, jaundice, and fecal incontinence.  Has dysphagia to solids and liquids GU : Denies urinary burning, blood in urine, urinary frequency, urinary hesitancy, nocturnal urination, and urinary incontinence. MS: Has  a history of rheumatoid arthritis. States all of his joints hurt Derm: Denies rash, itching, dry skin, hives, moles, warts, or unhealing ulcers.  Psych: Denies depression, anxiety, memory loss, suicidal ideation, hallucinations, paranoia, and confusion. Heme: Denies bruising, bleeding, and enlarged lymph nodes. Neuro:  Denies any headaches, dizziness, paresthesias. Endo:  Denies any problems withthyroid, adrenal function. Has a history of diabetes.      Physical Exam: BP 160/90 mmHg  Pulse 72  Ht 5\' 6"  (1.676 m)  Wt 223 lb (101.152 kg)  BMI 36.01 kg/m2 Constitutional: Pleasant,well-developed, African-American male in no acute distress. HEENT: Normocephalic and atraumatic. Conjunctivae are normal. No scleral icterus. Neck supple.  Cardiovascular: Normal rate, regular rhythm.  Pulmonary/chest: Effort normal and breath sounds normal. No wheezing, rales or rhonchi. Abdominal: Soft, nondistended, nontender. Bowel sounds active throughout. There are no masses palpable. No hepatomegaly. Well-healed infraumbilical incision Rectal:  Brown stool heme negative. Extremities: no edema Lymphadenopathy: No cervical adenopathy noted. Neurological: Alert and oriented to person place and time. Skin: Skin is warm and dry. No rashes noted. Psychiatric: Normal mood and affect. Behavior is normal.  ASSESSMENT AND PLAN: #1. Chest pain and dysphagia. These may  be related to some poorly controlled reflux and antireflux regimen has been reviewed. He will continue pantoprazole 40 mg twice a day and ranitidine 150 mg by mouth twice a day. He will be scheduled for a modified barium swallow with speech pathology to evaluate for possible transfer dysphagia as well as possible cricopharyngeal hypertrophy. He has signed a medical release to get a copy of his prior EGD report.  #2. Personal history of colon polyps. Diarrhea. Patient reports he is status post segmental resection. Has had diarrhea since. ? If this is functional and possibly due to IBS. Patient has signed a medical release to obtain his operative report from [facility] in [place]. If the ileocecal valve has been removed, he may be a candidate for a trial of Xifaxan or Flagyl for possible small intestinal bacterial overgrowth. In the meantime, he will be given a trial of Levsin 0.125 mg 1 by mouth every 8 hours when necessary spasm. A stool for lactoferrin, stool for C. difficile, TTG, IgA, CBC, and comprehensive metabolic panel will be obtained.  He will follow up in 2-3 weeks, sooner if needed. Pending the findings of his modified barium swallow and prior endoscopy and operative reports, he may be a candidate for an EGD as well as a colonoscopy.    Saavi Mceachron, Deloris Ping 03/18/2015, 5:38 PM  CC: Boykin Nearing, MD  Addendum: Report of EGD done on 01/03/2014 by Dr. Domingo Cocking at [facility] in [place] revealed small gastric antral ulcerations, mild gastritis, distal esophageal hiatal hernia. Colonoscopy report from 08/01/2013 by Dr. Domingo Cocking at good [facility] in [place] revealed a large broad-based transverse colon polyp was biopsied as well as tattooed, sigmoid diverticulosis. No evidence of any active bleeding.   Addendum 04/03/15: Received path report from [facility] in [place],  [place].  Date of proc 07/31/14. Duodenal biopsy shows duodenal mucosa with preserved villous architecture and patchy mild increase in intraepithelial lymphocytes, nonspecific, but cannot rule out low-level celiac disease. Stomach, antral ulcer biopsy: Antral gastric mucosa with mild chronic gastritis associated with reactive epithelial changes. Giemsa stain is negative for H. Pylori. Stomach body biopsy: Fundic type gastric mucosa with mild chronic gastritis. Negative for H. pylori. Gastroesophageal junction biopsy: Squamocolumnar  junctional mucosa with chronic and acute inflammation, and features suggestive of reflux esophagitis. No intestinal metaplasia or dysplasia seen.

## 2015-03-19 ENCOUNTER — Other Ambulatory Visit: Payer: Self-pay | Admitting: *Deleted

## 2015-03-19 LAB — TISSUE TRANSGLUTAMINASE, IGA: Tissue Transglutaminase Ab, IgA: 1 U/mL (ref <4)

## 2015-03-19 MED ORDER — NYSTATIN 100000 UNIT/ML MT SUSP: 5.0000 mL | Freq: Four times a day (QID) | OROMUCOSAL | Status: DC

## 2015-03-19 NOTE — Telephone Encounter (Signed)
LVM Rx at requested pharmacy  If any question please return call

## 2015-03-21 ENCOUNTER — Encounter: Payer: Self-pay | Admitting: Family Medicine

## 2015-03-22 ENCOUNTER — Ambulatory Visit (HOSPITAL_COMMUNITY)
Admission: RE | Admit: 2015-03-22 | Discharge: 2015-03-22 | Disposition: A | Discharge status: Home / Self Care | Payer: Medicaid Other | Source: Ambulatory Visit | Attending: Physician Assistant | Admitting: Physician Assistant

## 2015-03-22 DIAGNOSIS — R1310 Dysphagia, unspecified: Secondary | ICD-10-CM | POA: Diagnosis not present

## 2015-03-22 DIAGNOSIS — R1314 Dysphagia, pharyngoesophageal phase: Secondary | ICD-10-CM

## 2015-03-30 ENCOUNTER — Telehealth: Payer: Self-pay | Admitting: Family Medicine

## 2015-03-30 ENCOUNTER — Other Ambulatory Visit: Payer: Self-pay | Admitting: Physician Assistant

## 2015-03-30 MED ORDER — DILTIAZEM HCL ER 180 MG PO CP24: 180.0000 mg | ORAL_CAPSULE | Freq: Every day | ORAL | Status: DC

## 2015-03-30 MED ORDER — FUROSEMIDE 40 MG PO TABS: 40.0000 mg | ORAL_TABLET | Freq: Every day | ORAL | Status: DC

## 2015-03-30 MED ORDER — ATORVASTATIN CALCIUM 40 MG PO TABS: 40.0000 mg | ORAL_TABLET | Freq: Every day | ORAL | Status: DC

## 2015-03-30 MED ORDER — ASPIRIN EC 81 MG PO TBEC: 81.0000 mg | DELAYED_RELEASE_TABLET | Freq: Every day | ORAL | Status: DC

## 2015-03-30 MED ORDER — GABAPENTIN 600 MG PO TABS: 600.0000 mg | ORAL_TABLET | Freq: Three times a day (TID) | ORAL | Status: DC

## 2015-03-30 MED ORDER — LISINOPRIL 30 MG PO TABS: 30.0000 mg | ORAL_TABLET | Freq: Every day | ORAL | Status: DC

## 2015-03-30 NOTE — Telephone Encounter (Signed)
Pt uses [facility] off [address]. Medicaid wont approve his medications received by his other provider in [place]. Please follow up with pt as patient has been out of his meds. Thank you.   gabapentin (NEURONTIN) 600 MG tablet aspirin EC 81 MG tablet furosemide (LASIX) 40 MG tablet atorvastatin (LIPITOR) 40 MG tablet lisinopril (PRINIVIL,ZESTRIL) 30 MG tablet diltiazem (DILACOR XR) 180 MG 24 hr capsule

## 2015-04-03 ENCOUNTER — Encounter: Payer: Medicaid Other | Admitting: Pharmacist

## 2015-04-09 ENCOUNTER — Ambulatory Visit: Payer: Medicaid Other | Admitting: Physician Assistant

## 2015-04-12 ENCOUNTER — Encounter: Payer: Self-pay | Admitting: Physician Assistant

## 2015-04-12 ENCOUNTER — Other Ambulatory Visit (INDEPENDENT_AMBULATORY_CARE_PROVIDER_SITE_OTHER): Payer: Medicaid Other

## 2015-04-12 ENCOUNTER — Ambulatory Visit (INDEPENDENT_AMBULATORY_CARE_PROVIDER_SITE_OTHER): Payer: Medicaid Other | Admitting: Physician Assistant

## 2015-04-12 VITALS — BP 152/88 | HR 79 | Ht 66.0 in | Wt 222.2 lb

## 2015-04-12 DIAGNOSIS — K219 Gastro-esophageal reflux disease without esophagitis: Secondary | ICD-10-CM | POA: Diagnosis not present

## 2015-04-12 DIAGNOSIS — Z86010 Personal history of colonic polyps: Secondary | ICD-10-CM | POA: Diagnosis not present

## 2015-04-12 DIAGNOSIS — R1310 Dysphagia, unspecified: Secondary | ICD-10-CM | POA: Diagnosis not present

## 2015-04-12 DIAGNOSIS — R197 Diarrhea, unspecified: Secondary | ICD-10-CM | POA: Diagnosis not present

## 2015-04-12 LAB — IGA: IgA: 349 mg/dL (ref 68–378)

## 2015-04-12 MED ORDER — NA SULFATE-K SULFATE-MG SULF 17.5-3.13-1.6 GM/177ML PO SOLN: ORAL | Status: DC

## 2015-04-12 NOTE — Patient Instructions (Addendum)
You have been scheduled for an endoscopy and colonoscopy. Please follow the written instructions given to you at your visit today. Please pick up your prep supplies at the pharmacy within the next 1-3 days. If you use inhalers (even only as needed), please bring them with you on the day of your procedure. Your physician has requested that you go to www.startemmi.com and enter the access code given to you at your visit today. This web site gives a general overview about your procedure. However, you should still follow specific instructions given to you by our office regarding your preparation for the procedure.  Your physician has requested that you go to the basement for the following lab work before leaving today: IGA, TTG and pancreatic Fecal elastase   Please continue taking your pantoprazole, Ranitidine and Levsin as prescribed Today your blood pressure was elevated. Please follow up with your Primary Care Provider for blood pressure management.

## 2015-04-12 NOTE — Progress Notes (Signed)
Patient ID: Marco [name], male   DOB: [birth year], 59 y.o.   MRN: [id number]     History of Present Illness: Marco [name] is a 59 year old gentleman who was initially evaluated on 03/18/2015 after being referred for noncardiac chest pain. Marco [name] relocated to [place] from [place] recently. He has been having intermittent chest pain for about a year and his chest pain is often associated with shortness of breath. He also has a history of GERD and gastric ulcers. He has been evaluated by cardiology. He had a cardiology evaluation in July 2014 in [place] with a normal catheterization. More recently he had a negative CT angiogram of his chest. Echocardiogram in May of this year showed normal LV function without wall motion abnormalities. It was felt that his chest pain was not cardiac in origin. He has also been given a trial of colchicine with no relief as well as a trial of prednisone with no relief. He also has a history of paroxysmal atrial fibrillation which was diagnosed in July 2014, and he subsequently underwent catheter ablation in [place]. He has been maintained on diltiazem and carvedilol. Since his last visit he has been out of his diltiazem but says he will refill it next week. He was previously on Xarelto but this was discontinued in the setting of anemia which the patient seems to think was a GI bleed. He is currently on aspirin therapy. At his last visit he reported difficulty swallowing both solids and liquids for about a years duration. He feels foods and liquid build up in his esophagus and he often has to spit them out. He recently had a swallowing evaluation with speech pathology who felt the patient demonstrated normal swallow function. No cricopharyngeal hypertrophy was appreciated UES compliance and patency were normal. No oropharyngeal residuals were noted. Patient did have appearance of slightly slow esophageal transit of solids though this cleared with sips of water. He  continues to complain of dysphagia and chest discomfort. Patient states he feels miserable and would like something done as soon as possible.  At his last visit he was also complaining of diarrhea. He had a colonoscopy in late 2014 at which time a large polyp was noted. He had a segmental resection in February 2015. Since then he has had loose stools on a daily basis. He states whenever he eats he has an urgent bowel movement. He averages 5 or 6 bowel movements a day. He now states his stools are frequently oily. He has a history of C. difficile 2 years ago for which he says he had 2 rounds of treatment. Stool for C. difficile on 03/16/2015 was negative.  Medical release forms were sent to the facilities in [place] where he had his prior procedures. Report of EGD done on 01/03/2014 by Dr. Domingo Cocking at [facility] in [place] revealed small gastric antral ulcerations, mild gastritis, distal esophageal hiatal hernia. Colonoscopy report from 08/01/2013 by Dr. Domingo Cocking at [facility] revealed a large broad-based transverse colon polyp was biopsied as well as tattooed, sigmoid diverticulosis. No evidence of any active bleeding. Multiple request have been sent for the pathology and unfortunately we have not received her response. On 04/03/2015 we received a pathology report from good [facility] for date of procedure 07/31/2014. 21 mL biopsy showed 2 wide and all mucosa with preserved villous architecture and patchy mild increase in intraepithelial lymphocytes, nonspecific, but cannot rule out low-level celiac disease. Stomach, antral ulcer biopsy: Antral gastric mucosa  with mild chronic gastritis associated with reactive epithelial changes Giemsa stain negative for H. pylori Stomach body biopsy: Fundic type gastric mucosa with mild chronic gastritis. Negative for H. pylori. Gastroesophageal junction biopsy: Squamocolumnar junctional mucosa with chronic and acute  inflammation, and features suggestive of reflux esophagitis. No intestinal metaplasia or dysplasia seen.   Past Medical History  Diagnosis Date  . Non-obstructive CAD     a. 02/2013 Cath ([place]): nonobs dzs;  b. 09/2014 Myoview ([place]): EF 55%, no ischemia;  c. 12/2014 Echo: EF 50-55%, gr1 DD, no effusion.  . Essential hypertension   . Diabetes mellitus without complication   . Rheumatoid arthritis   . Chronic pain   . PAF (paroxysmal atrial fibrillation)     a. 02/2013 s/p rfca in [place], [place]-->prev on Xarelto, d/c'd 2/2 anemia, ? GIB.  [name] Benign colon polyp 08/01/2013    [facility] in [place]. large base tranverse colon polyp was biopsied. polyp was benign with minimal surface hyperplastic change.  . Morbid obesity   . Gastritis 07/29/2013    confirmed on EGD, bx done an negative for intestinal metaplasia, dsyplasia or H. pylori. normal gastric emptying study done 07/13/2013.  [name] Esophageal hiatal hernia 07/29/2013    confirmed on EGD   . Sigmoid diverticulosis 08/01/2013    confirmed on colonscopy. record scanned into chart    Past Surgical History  Procedure Laterality Date  . Colon surgery  09/2014    colon resection   . Cardiac catheterization  05/2014    ablation for atrial fibrillation  . Colon resection  09/2013    due to large, abnormal polpy. non cancerous per patient.    Family History  Problem Relation Age of Onset  . Hypertension Mother   . Diabetes Mother   . Cancer Mother   . COPD Mother   . Hypertension Father   . Diabetes Father   . Heart Problems Father   . COPD Father    Social History  Substance Use Topics  . Smoking status: Never Smoker   . Smokeless tobacco: None  . Alcohol Use: No   Current Outpatient Prescriptions  Medication Sig Dispense Refill  . acetaminophen-codeine (TYLENOL #3) 300-30 MG per tablet Take 1 tablet by mouth every 8 (eight) hours as needed for moderate pain. 60 tablet 0  . aspirin EC 81 MG tablet Take 1 tablet (81 mg  total) by mouth daily. 150 tablet 0  . atorvastatin (LIPITOR) 40 MG tablet Take 1 tablet (40 mg total) by mouth daily. 30 tablet 3  . carvedilol (COREG) 12.5 MG tablet Take 12.5 mg by mouth 2 (two) times daily with a meal.    . diltiazem (DILACOR XR) 180 MG 24 hr capsule Take 1 capsule (180 mg total) by mouth daily. 30 capsule 3  . furosemide (LASIX) 40 MG tablet Take 1 tablet (40 mg total) by mouth daily. 30 tablet 3  . gabapentin (NEURONTIN) 600 MG tablet Take 1 tablet (600 mg total) by mouth 3 (three) times daily. 90 tablet 3  . hyoscyamine (LEVSIN SL) 0.125 MG SL tablet Place 1 tablet (0.125 mg total) under the tongue every 6 (six) hours as needed. 90 tablet 3  . insulin aspart protamine - aspart (NOVOLOG MIX 70/30 FLEXPEN) (70-30) 100 UNIT/ML FlexPen Inject 0.15 mLs (15 Units total) into the skin 2 (two) times daily with a meal. 15 mL 11  . Insulin Detemir (LEVEMIR FLEXPEN) 100 UNIT/ML Pen Inject 60 Units into the skin daily at 10  pm. 15 mL 11  . Insulin Pen Needle (B-D ULTRAFINE III SHORT PEN) 31G X 8 MM MISC 1 application by Does not apply route daily. 100 each 3  . lisinopril (PRINIVIL,ZESTRIL) 30 MG tablet Take 1 tablet (30 mg total) by mouth daily. 30 tablet 3  . nystatin (MYCOSTATIN) 100000 UNIT/ML suspension Take 5 mLs (500,000 Units total) by mouth 4 (four) times daily. 60 mL 0  . pantoprazole (PROTONIX) 40 MG tablet Take 1 tablet (40 mg total) by mouth 2 (two) times daily. 60 tablet 4  . predniSONE (DELTASONE) 10 MG tablet Take 1 tablet (10 mg total) by mouth daily with breakfast. 30 tablet 2  . ranitidine (ZANTAC) 150 MG capsule Take 1 capsule (150 mg total) by mouth 2 (two) times daily. 60 capsule 3  . sildenafil (VIAGRA) 100 MG tablet Take 100 mg by mouth daily as needed for erectile dysfunction.    . Na Sulfate-K Sulfate-Mg Sulf SOLN Take as directed per coloniscopy instructions 354 mL 0   No current facility-administered medications for this visit.   No Known  Allergies   Review of Systems: Per history of present illness otherwise negative.  LAB RESULTS: Stool for C. difficile 03/16/2015 negative CBC 03/16/2015 WBC 11.2, hemoglobin 14.5, hematocrit 44.3, platelets 220,000. IgA 03/16/2015 was 318 Comprehensive metabolic panel 41/66/0630 alkaline phosphatase 40, lipase 33, AST 16, ALT 28, total bili 0.6.   Studies:   Dg Swallowing Func-speech Pathology  03/22/2015    Objective Swallowing Evaluation:    Patient Details  Name: Kairee Isa MRN: [id number] Date of Birth: 1955-10-28  Today's Date: 03/22/2015 Time: SLP Start Time (ACUTE ONLY): 1145-SLP Stop Time (ACUTE ONLY): 1200 SLP Time Calculation (min) (ACUTE ONLY): 15 min  Past Medical History:  Past Medical History  Diagnosis Date  . Non-obstructive CAD     a. 02/2013 Cath ([place]): nonobs dzs;  b. 09/2014 Myoview ([place]): EF 55%, no  ischemia;  c. 12/2014 Echo: EF 50-55%, gr1 DD, no effusion.  . Essential hypertension   . Diabetes mellitus without complication   . Rheumatoid arthritis   . Chronic pain   . PAF (paroxysmal atrial fibrillation)     a. 02/2013 s/p rfca in [place], [place]-->prev on Xarelto, d/c'd 2/2  anemia, ? GIB.  [name] Benign colon polyp 08/01/2013    [facility] in [place]. large base tranverse colon polyp  was biopsied. polyp was benign with minimal surface hyperplastic change.  . Morbid obesity   . Gastritis 07/29/2013    confirmed on EGD, bx done an negative for intestinal metaplasia,  dsyplasia or H. pylori. normal gastric emptying study done 07/13/2013.  [name] Esophageal hiatal hernia 07/29/2013    confirmed on EGD   . Sigmoid diverticulosis 08/01/2013    confirmed on colonscopy. record scanned into chart   Past Surgical History:  Past Surgical History  Procedure Laterality Date  . Colon surgery  09/2014    colon resection   . Cardiac catheterization  05/2014    ablation for atrial fibrillation  . Colon resection  09/2013    due to large, abnormal polpy. non cancerous per patient.    HPI:   Other Pertinent Information: Pt is a 59 year old male arriving for an  outpatient MBS due to complaint of feeling food lodged, atypical chest  pain. Pt has a history of GERD. Pt is being seen y GI MD. Recommended MBS  to evaluate pt for CP hypertrophy. Report of EGD done on 01/03/2014 by Dr.  Domingo Cocking  at good [facility] in [place]  revealed small gastric antral ulcerations, mild gastritis, distal  esophageal hiatal hernia.   No Data Recorded  Assessment / Plan / Recommendation CHL IP CLINICAL IMPRESSIONS 03/22/2015  Therapy Diagnosis WFL  Clinical Impression Pt demonstrates a normal swallow function. No  cricopharyngeal hypertrophy; UES compliance and patency are WNL. No  oropharyngeal residuals. Pt did have appearance of slightly slow  esophageal transit of solids, though this cleared easily with sips of thin  liquids. The pt reported globus mid-sternum even when no residuals were  present. Regular diet adn thin liquids with basic esophageal precautions  are recommended. No SLP f/u needed.       CHL IP TREATMENT RECOMMENDATION 03/22/2015  Treatment Recommendations No treatment recommended at this time     CHL IP DIET RECOMMENDATION 03/22/2015  SLP Diet Recommendations Age appropriate regular solids;Thin  Liquid Administration via (None)  Medication Administration Whole meds with liquid  Compensations Follow solids with liquid  Postural Changes and/or Swallow Maneuvers (None)     CHL IP OTHER RECOMMENDATIONS 03/22/2015  Recommended Consults (None)  Oral Care Recommendations Patient independent with oral care  Other Recommendations (None)     No flowsheet data found.   No flowsheet data found.   Pertinent Vitals/Pain NA    SLP Swallow Goals No flowsheet data found.  No flowsheet data found.    CHL IP REASON FOR REFERRAL 03/22/2015  Reason for Referral Objectively evaluate swallowing function     CHL IP ORAL PHASE 03/22/2015  Lips (None)  Tongue (None)  Mucous membranes (None)  Nutritional  status (None)  Other (None)  Oxygen therapy (None)  Oral Phase WFL  Oral - Pudding Teaspoon (None)  Oral - Pudding Cup (None)  Oral - Honey Teaspoon (None)  Oral - Honey Cup (None)  Oral - Honey Syringe (None)  Oral - Nectar Teaspoon (None)  Oral - Nectar Cup (None)  Oral - Nectar Straw (None)  Oral - Nectar Syringe (None)  Oral - Ice Chips (None)  Oral - Thin Teaspoon (None)  Oral - Thin Cup (None)  Oral - Thin Straw (None)  Oral - Thin Syringe (None)  Oral - Puree (None)  Oral - Mechanical Soft (None)  Oral - Regular (None)  Oral - Multi-consistency (None)  Oral - Pill (None)  Oral Phase - Comment (None)      CHL IP PHARYNGEAL PHASE 03/22/2015  Pharyngeal Phase WFL  Pharyngeal - Pudding Teaspoon (None)  Penetration/Aspiration details (pudding teaspoon) (None)  Pharyngeal - Pudding Cup (None)  Penetration/Aspiration details (pudding cup) (None)  Pharyngeal - Honey Teaspoon (None)  Penetration/Aspiration details (honey teaspoon) (None)  Pharyngeal - Honey Cup (None)  Penetration/Aspiration details (honey cup) (None)  Pharyngeal - Honey Syringe (None)  Penetration/Aspiration details (honey syringe) (None)  Pharyngeal - Nectar Teaspoon (None)  Penetration/Aspiration details (nectar teaspoon) (None)  Pharyngeal - Nectar Cup (None)  Penetration/Aspiration details (nectar cup) (None)  Pharyngeal - Nectar Straw (None)  Penetration/Aspiration details (nectar straw) (None)  Pharyngeal - Nectar Syringe (None)  Penetration/Aspiration details (nectar syringe) (None)  Pharyngeal - Ice Chips (None)  Penetration/Aspiration details (ice chips) (None)  Pharyngeal - Thin Teaspoon (None)  Penetration/Aspiration details (thin teaspoon) (None)  Pharyngeal - Thin Cup (None)  Penetration/Aspiration details (thin cup) (None)  Pharyngeal - Thin Straw (None)  Penetration/Aspiration details (thin straw) (None)  Pharyngeal - Thin Syringe (None)  Penetration/Aspiration details (thin syringe') (None)  Pharyngeal - Puree (None)   Penetration/Aspiration details (puree) (None)  Pharyngeal - Mechanical Soft (None)  Penetration/Aspiration details (mechanical soft) (None)  Pharyngeal - Regular (None)  Penetration/Aspiration details (regular) (None)  Pharyngeal - Multi-consistency (None)  Penetration/Aspiration details (multi-consistency) (None)  Pharyngeal - Pill (None)  Penetration/Aspiration details (pill) (None)  Pharyngeal Comment (None)      CHL IP CERVICAL ESOPHAGEAL PHASE 03/22/2015  Cervical Esophageal Phase WFL  Pudding Teaspoon (None)  Pudding Cup (None)  Honey Teaspoon (None)  Honey Cup (None)  Honey Straw (None)  Nectar Teaspoon (None)  Nectar Cup (None)  Nectar Straw (None)  Nectar Sippy Cup (None)  Thin Teaspoon (None)  Thin Cup (None)  Thin Straw (None)  Thin Sippy Cup (None)  Cervical Esophageal Comment (None)    CHL IP GO 03/22/2015  Functional Assessment Tool Used clinical judgement  Functional Limitations Swallowing  Swallow Current Status (H8527) CH  Swallow Goal Status (P8242) [place]  Swallow Discharge Status (P5361) CH  Motor Speech Current Status (W4315) (None)  Motor Speech Goal Status (Q0086) (None)  Motor Speech Goal Status (P6195) (None)  Spoken Language Comprehension Current Status (K9326) (None)  Spoken Language Comprehension Goal Status (Z1245) (None)  Spoken Language Comprehension Discharge Status [phone]) (None)  Spoken Language Expression Current Status (P3825) (None)  Spoken Language Expression Goal Status (K5397) (None)  Spoken Language Expression Discharge Status [phone]) (None)  Attention Current Status (P3790) (None)  Attention Goal Status (W4097) (None)  Attention Discharge Status (D5329) (None)  Memory Current Status (J2426) (None)  Memory Goal Status (S3419) (None)  Memory Discharge Status (Q2229) (None)  Voice Current Status (N9892) (None)  Voice Goal Status (J1941) (None)  Voice Discharge Status (D4081) (None)  Other Speech-Language Pathology Functional Limitation [phone]) (None)  Other Speech-Language Pathology  Functional Limitation Goal Status (H6314)  (None)  Other Speech-Language Pathology Functional Limitation Discharge Status  [phone]) (None)           DeBlois, Katherene Ponto 03/22/2015, 1:47 PM      Physical Exam: BP 152/88 mmHg  Pulse 79  Ht 5\' 6"  (1.676 m)  Wt 222 lb 3.2 oz (100.789 kg)  BMI 35.88 kg/m2 Constitutional: Pleasant,well-developed, African-American male in no acute distress. HEENT: Normocephalic and atraumatic. Conjunctivae are normal. No scleral icterus. Neck supple.  Cardiovascular: Normal rate, regular rhythm.  Pulmonary/chest: Effort normal and breath sounds normal. No wheezing, rales or rhonchi. Abdominal: Soft, nondistended, nontender. Bowel sounds active throughout. There are no masses palpable. No hepatomegaly. Well-healed infraumbilical incision Rectal: Brown stool heme negative. Extremities: no edema Lymphadenopathy: No cervical adenopathy noted. Neurological: Alert and oriented to person place and time. Skin: Skin is warm and dry. No rashes noted. Psychiatric: Normal mood and affect. Behavior is normal.  Assessment and Recommendations: #1. Chest pain and dysphagia. These may be due to some poorly controlled reflux/esophageal spasm. He will continue his current regimen of twice a day pantoprazole and twice a day ranitidine. He will be scheduled for an EGD to evaluate for esophagitis, gastritis, ulcer etc. as well as to obtain small bowel biopsies for possible celiac.The risks, benefits, and alternatives to endoscopy with possible biopsy and possible dilation were discussed with the patient and they consent to proceed.   #2. Personal history of colon polyps. Patient is status post segmental resection in February 2015. Unfortunately we have not been able to obtain path pathology.  #3. Diarrhea. Patient reports diarrhea since his surgery in 2015. C. difficile testing has been negative. Patient is reporting oily stools. A fecal elastase will be obtained. He will be  scheduled for colonoscopy to evaluate for  recurrent polyps or neoplasia as well as to evaluate for possible microscopic colitis.The risks, benefits, and alternatives to colonoscopy with possible biopsy and possible polypectomy were discussed with the patient and they consent to proceed.   Further recommendations will be made pending the findings of the above.      Mikka Kissner, Vita Barley [name] 04/12/2015,

## 2015-04-13 ENCOUNTER — Emergency Department (HOSPITAL_COMMUNITY)
Admission: EM | Admit: 2015-04-13 | Discharge: 2015-04-13 | Disposition: A | Discharge status: Home / Self Care | Payer: Medicaid Other | Attending: Emergency Medicine | Admitting: Emergency Medicine

## 2015-04-13 ENCOUNTER — Telehealth: Payer: Self-pay

## 2015-04-13 ENCOUNTER — Other Ambulatory Visit: Payer: Self-pay | Admitting: Family Medicine

## 2015-04-13 ENCOUNTER — Encounter (HOSPITAL_COMMUNITY): Payer: Self-pay | Admitting: Family Medicine

## 2015-04-13 ENCOUNTER — Telehealth: Payer: Self-pay | Admitting: Family Medicine

## 2015-04-13 DIAGNOSIS — F419 Anxiety disorder, unspecified: Secondary | ICD-10-CM | POA: Insufficient documentation

## 2015-04-13 DIAGNOSIS — Z79899 Other long term (current) drug therapy: Secondary | ICD-10-CM | POA: Insufficient documentation

## 2015-04-13 DIAGNOSIS — M10072 Idiopathic gout, left ankle and foot: Secondary | ICD-10-CM | POA: Insufficient documentation

## 2015-04-13 DIAGNOSIS — Z9889 Other specified postprocedural states: Secondary | ICD-10-CM | POA: Insufficient documentation

## 2015-04-13 DIAGNOSIS — G8929 Other chronic pain: Secondary | ICD-10-CM | POA: Diagnosis not present

## 2015-04-13 DIAGNOSIS — I10 Essential (primary) hypertension: Secondary | ICD-10-CM | POA: Diagnosis not present

## 2015-04-13 DIAGNOSIS — E119 Type 2 diabetes mellitus without complications: Secondary | ICD-10-CM | POA: Diagnosis not present

## 2015-04-13 DIAGNOSIS — M069 Rheumatoid arthritis, unspecified: Secondary | ICD-10-CM | POA: Insufficient documentation

## 2015-04-13 DIAGNOSIS — I2510 Atherosclerotic heart disease of native coronary artery without angina pectoris: Secondary | ICD-10-CM | POA: Insufficient documentation

## 2015-04-13 DIAGNOSIS — K2970 Gastritis, unspecified, without bleeding: Secondary | ICD-10-CM | POA: Insufficient documentation

## 2015-04-13 DIAGNOSIS — Z7982 Long term (current) use of aspirin: Secondary | ICD-10-CM | POA: Diagnosis not present

## 2015-04-13 DIAGNOSIS — Z794 Long term (current) use of insulin: Secondary | ICD-10-CM | POA: Insufficient documentation

## 2015-04-13 DIAGNOSIS — Z86010 Personal history of colonic polyps: Secondary | ICD-10-CM | POA: Diagnosis not present

## 2015-04-13 DIAGNOSIS — Z7952 Long term (current) use of systemic steroids: Secondary | ICD-10-CM | POA: Insufficient documentation

## 2015-04-13 DIAGNOSIS — M79672 Pain in left foot: Secondary | ICD-10-CM | POA: Diagnosis present

## 2015-04-13 DIAGNOSIS — I480 Paroxysmal atrial fibrillation: Secondary | ICD-10-CM | POA: Insufficient documentation

## 2015-04-13 LAB — TISSUE TRANSGLUTAMINASE, IGA: TISSUE TRANSGLUTAMINASE AB, IGA: 1 U/mL (ref <4)

## 2015-04-13 MED ORDER — METHYLPREDNISOLONE SODIUM SUCC 125 MG IJ SOLR
125.0000 mg | Freq: Once | INTRAMUSCULAR | Status: AC
  Administered 2015-04-13: 125 mg via INTRAMUSCULAR
  Filled 2015-04-13: qty 2

## 2015-04-13 MED ORDER — HYDROCODONE-ACETAMINOPHEN 5-325 MG PO TABS: 1.0000 | ORAL_TABLET | Freq: Four times a day (QID) | ORAL | Status: DC | PRN

## 2015-04-13 MED ORDER — PREDNISONE 20 MG PO TABS: ORAL_TABLET | ORAL | Status: DC

## 2015-04-13 MED ORDER — HYDROCODONE-ACETAMINOPHEN 5-325 MG PO TABS
2.0000 | ORAL_TABLET | Freq: Once | ORAL | Status: AC
  Administered 2015-04-13: 2 via ORAL
  Filled 2015-04-13: qty 2

## 2015-04-13 MED ORDER — GABAPENTIN 300 MG PO CAPS: 600.0000 mg | ORAL_CAPSULE | Freq: Three times a day (TID) | ORAL | Status: DC

## 2015-04-13 MED ORDER — ALPRAZOLAM 0.5 MG PO TABS
0.5000 mg | ORAL_TABLET | Freq: Once | ORAL | Status: AC
  Administered 2015-04-13: 0.5 mg via ORAL
  Filled 2015-04-13: qty 2

## 2015-04-13 NOTE — ED Notes (Signed)
Pt presents from home via POV with c/o bilateral foot pain that began last night with the left foot and is now affecting both feet. Pt states has never experienced this type of pain before and he took "Tylenol 300" for it last night.  Pt also states his feet appear swollen to him.

## 2015-04-13 NOTE — Discharge Instructions (Signed)
It was our pleasure to provide your ER care today - we hope that you feel better.  Elevate feet to help with swelling and pain.  Rest.  Avoid pressure on feet.   Take prednisone as prescribed - once completed with the prednisone taper, resume your normal dose of prednisone, as prescribed by your doctor.  Take hydrocodone as need for pain. No driving when taking hydrocodone. Also, do not take tylenol or acetaminophen containing medication when taking hydrocodone.  Follow up with your primary care doctor in the coming week for recheck.  Also have your blood pressure rechecked then, as it is high today.   Return to ER if worse, new symptoms, fevers, spreading redness, intractable pain, other concern.  You were given pain medication in the ER - no driving for the next 4 hours.      Gout Gout is an inflammatory arthritis caused by a buildup of uric acid crystals in the joints. Uric acid is a chemical that is normally present in the blood. When the level of uric acid in the blood is too high it can form crystals that deposit in your joints and tissues. This causes joint redness, soreness, and swelling (inflammation). Repeat attacks are common. Over time, uric acid crystals can form into masses (tophi) near a joint, destroying bone and causing disfigurement. Gout is treatable and often preventable. CAUSES  The disease begins with elevated levels of uric acid in the blood. Uric acid is produced by your body when it breaks down a naturally found substance called purines. Certain foods you eat, such as meats and fish, contain high amounts of purines. Causes of an elevated uric acid level include:  Being passed down from parent to child (heredity).  Diseases that cause increased uric acid production (such as obesity, psoriasis, and certain cancers).  Excessive alcohol use.  Diet, especially diets rich in meat and seafood.  Medicines, including certain cancer-fighting medicines (chemotherapy),  water pills (diuretics), and aspirin.  Chronic kidney disease. The kidneys are no longer able to remove uric acid well.  Problems with metabolism. Conditions strongly associated with gout include:  Obesity.  High blood pressure.  High cholesterol.  Diabetes. Not everyone with elevated uric acid levels gets gout. It is not understood why some people get gout and others do not. Surgery, joint injury, and eating too much of certain foods are some of the factors that can lead to gout attacks. SYMPTOMS   An attack of gout comes on quickly. It causes intense pain with redness, swelling, and warmth in a joint.  Fever can occur.  Often, only one joint is involved. Certain joints are more commonly involved:  Base of the big toe.  Knee.  Ankle.  Wrist.  Finger. Without treatment, an attack usually goes away in a few days to weeks. Between attacks, you usually will not have symptoms, which is different from many other forms of arthritis. DIAGNOSIS  Your caregiver will suspect gout based on your symptoms and exam. In some cases, tests may be recommended. The tests may include:  Blood tests.  Urine tests.  X-rays.  Joint fluid exam. This exam requires a needle to remove fluid from the joint (arthrocentesis). Using a microscope, gout is confirmed when uric acid crystals are seen in the joint fluid. TREATMENT  There are two phases to gout treatment: treating the sudden onset (acute) attack and preventing attacks (prophylaxis).  Treatment of an Acute Attack.  Medicines are used. These include anti-inflammatory medicines or steroid medicines.  An injection of steroid medicine into the affected joint is sometimes necessary.  The painful joint is rested. Movement can worsen the arthritis.  You may use warm or cold treatments on painful joints, depending which works best for you.  Treatment to Prevent Attacks.  If you suffer from frequent gout attacks, your caregiver may advise  preventive medicine. These medicines are started after the acute attack subsides. These medicines either help your kidneys eliminate uric acid from your body or decrease your uric acid production. You may need to stay on these medicines for a very long time.  The early phase of treatment with preventive medicine can be associated with an increase in acute gout attacks. For this reason, during the first few months of treatment, your caregiver may also advise you to take medicines usually used for acute gout treatment. Be sure you understand your caregiver's directions. Your caregiver may make several adjustments to your medicine dose before these medicines are effective.  Discuss dietary treatment with your caregiver or dietitian. Alcohol and drinks high in sugar and fructose and foods such as meat, poultry, and seafood can increase uric acid levels. Your caregiver or dietitian can advise you on drinks and foods that should be limited. HOME CARE INSTRUCTIONS   Do not take aspirin to relieve pain. This raises uric acid levels.  Only take over-the-counter or prescription medicines for pain, discomfort, or fever as directed by your caregiver.  Rest the joint as much as possible. When in bed, keep sheets and blankets off painful areas.  Keep the affected joint raised (elevated).  Apply warm or cold treatments to painful joints. Use of warm or cold treatments depends on which works best for you.  Use crutches if the painful joint is in your leg.  Drink enough fluids to keep your urine clear or pale yellow. This helps your body get rid of uric acid. Limit alcohol, sugary drinks, and fructose drinks.  Follow your dietary instructions. Pay careful attention to the amount of protein you eat. Your daily diet should emphasize fruits, vegetables, whole grains, and fat-free or low-fat milk products. Discuss the use of coffee, vitamin C, and cherries with your caregiver or dietitian. These may be helpful in  lowering uric acid levels.  Maintain a healthy body weight. SEEK MEDICAL CARE IF:   You develop diarrhea, vomiting, or any side effects from medicines.  You do not feel better in 24 hours, or you are getting worse. SEEK IMMEDIATE MEDICAL CARE IF:   Your joint becomes suddenly more tender, and you have chills or a fever. MAKE SURE YOU:   Understand these instructions.  Will watch your condition.  Will get help right away if you are not doing well or get worse. Document Released: 07/25/2000 Document Revised: 12/12/2013 Document Reviewed: 03/10/2012 [facility] Patient Information 2015 [place], [place]. This information is not intended to replace advice given to you by your health care provider. Make sure you discuss any questions you have with your health care provider.

## 2015-04-13 NOTE — ED Notes (Signed)
Pt comfortable with discharge and follow up instructions. Prescriptions x2. 

## 2015-04-13 NOTE — Telephone Encounter (Signed)
Patient was supposed to come in today to be instructed on his colonoscopy scheduled for 04/19/2015 but has gout and is unable to come.  Patient will come in Tuesday to be instructed instead.

## 2015-04-13 NOTE — Progress Notes (Signed)
Agree with initial assessment and plans 

## 2015-04-13 NOTE — Telephone Encounter (Signed)
Patient called requesting to speak to nurse regarding medication gabapentin (NEURONTIN) 600 MG tablet, patient states he has been unable to get medication due to it needing prior auth, please f/u

## 2015-04-13 NOTE — ED Provider Notes (Signed)
CSN: [id number]     Arrival date & time 04/13/15  3762 History   First MD Initiated Contact with Patient 04/13/15 [phone]     Chief Complaint  Patient presents with  . Foot Pain     (Consider location/radiation/quality/duration/timing/severity/associated sxs/prior Treatment) Patient is a 59 y.o. male presenting with lower extremity pain. The history is provided by the patient.  Foot Pain Pertinent negatives include no chest pain, no abdominal pain, no headaches and no shortness of breath.  c/o left foot pain > right foot pain since yesterday. Pain constant, dull, mod-severe. Touching foot, moving foot, and standing on foot make worse. No hx same pain. No hx gout. Pt states was told had RA, but states his current doctor says no RA.  No skin lesions/rash to feet. No claudication. Hx diabetic neuropathy bil feet, but that was mainly numbness/tinglng in past. No fever or chills.      Past Medical History  Diagnosis Date  . Non-obstructive CAD     a. 02/2013 Cath ([place]): nonobs dzs;  b. 09/2014 Myoview ([place]): EF 55%, no ischemia;  c. 12/2014 Echo: EF 50-55%, gr1 DD, no effusion.  . Essential hypertension   . Diabetes mellitus without complication   . Rheumatoid arthritis   . Chronic pain   . PAF (paroxysmal atrial fibrillation)     a. 02/2013 s/p rfca in [place], [place]-->prev on Xarelto, d/c'd 2/2 anemia, ? GIB.  [name] Benign colon polyp 08/01/2013    [facility] in [place]. large base tranverse colon polyp was biopsied. polyp was benign with minimal surface hyperplastic change.  . Morbid obesity   . Gastritis 07/29/2013    confirmed on EGD, bx done an negative for intestinal metaplasia, dsyplasia or H. pylori. normal gastric emptying study done 07/13/2013.  [name] Esophageal hiatal hernia 07/29/2013    confirmed on EGD   . Sigmoid diverticulosis 08/01/2013    confirmed on colonscopy. record scanned into chart   Past Surgical History  Procedure Laterality Date  . Colon surgery  09/2014    colon resection   . Cardiac catheterization  05/2014    ablation for atrial fibrillation  . Colon resection  09/2013    due to large, abnormal polpy. non cancerous per patient.    Family History  Problem Relation Age of Onset  . Hypertension Mother   . Diabetes Mother   . Cancer Mother   . COPD Mother   . Hypertension Father   . Diabetes Father   . Heart Problems Father   . COPD Father    Social History  Substance Use Topics  . Smoking status: Never Smoker   . Smokeless tobacco: None  . Alcohol Use: No    Review of Systems  Constitutional: Negative for fever and chills.  HENT: Negative for sore throat.   Eyes: Negative for redness.  Respiratory: Negative for cough and shortness of breath.   Cardiovascular: Negative for chest pain.  Gastrointestinal: Negative for vomiting and abdominal pain.  Genitourinary: Negative for flank pain.  Musculoskeletal: Negative for back pain and neck pain.  Skin: Negative for rash.  Neurological: Negative for headaches.  Hematological: Does not bruise/bleed easily.  Psychiatric/Behavioral: Negative for confusion.      Allergies  Review of patient's allergies indicates no known allergies.  Home Medications   Prior to Admission medications   Medication Sig Start Date End Date Taking? Authorizing Provider  acetaminophen-codeine (TYLENOL #3) 300-30 MG per tablet Take 1 tablet by mouth every 8 (eight) hours  as needed for moderate pain. 03/01/15  Yes Boykin Nearing, MD  aspirin EC 81 MG tablet Take 1 tablet (81 mg total) by mouth daily. 03/30/15  Yes Tiffany Daneil Dan, [name]  atorvastatin (LIPITOR) 40 MG tablet Take 1 tablet (40 mg total) by mouth daily. 03/30/15  Yes Tiffany Daneil Dan, [name]  carvedilol (COREG) 12.5 MG tablet Take 12.5 mg by mouth 2 (two) times daily with a meal.   Yes Historical Provider, MD  diltiazem (DILACOR XR) 180 MG 24 hr capsule Take 1 capsule (180 mg total) by mouth daily. 03/30/15  Yes Tiffany Daneil Dan, [name]  furosemide  (LASIX) 40 MG tablet Take 1 tablet (40 mg total) by mouth daily. 03/30/15  Yes Tiffany Daneil Dan, [name]  gabapentin (NEURONTIN) 600 MG tablet Take 1 tablet (600 mg total) by mouth 3 (three) times daily. 03/30/15  Yes Tiffany Daneil Dan, [name]  hyoscyamine (LEVSIN SL) 0.125 MG SL tablet Place 1 tablet (0.125 mg total) under the tongue every 6 (six) hours as needed. 03/16/15  Yes Lori P Hvozdovic, [name]  insulin aspart protamine - aspart (NOVOLOG MIX 70/30 FLEXPEN) (70-30) 100 UNIT/ML FlexPen Inject 0.15 mLs (15 Units total) into the skin 2 (two) times daily with a meal. 02/23/15  Yes Josalyn Funches, MD  Insulin Detemir (LEVEMIR FLEXPEN) 100 UNIT/ML Pen Inject 60 Units into the skin daily at 10 pm. 02/23/15  Yes Josalyn Funches, MD  lisinopril (PRINIVIL,ZESTRIL) 30 MG tablet Take 1 tablet (30 mg total) by mouth daily. 03/30/15  Yes Tiffany Daneil Dan, [name]  Na Sulfate-K Sulfate-Mg Sulf SOLN Take as directed per coloniscopy instructions 04/12/15  Yes Lori P Hvozdovic, [name]  nystatin (MYCOSTATIN) 100000 UNIT/ML suspension Take 5 mLs (500,000 Units total) by mouth 4 (four) times daily. 03/19/15  Yes Josalyn Funches, MD  pantoprazole (PROTONIX) 40 MG tablet Take 1 tablet (40 mg total) by mouth 2 (two) times daily. 03/16/15  Yes Lori P Hvozdovic, [name]  predniSONE (DELTASONE) 10 MG tablet Take 1 tablet (10 mg total) by mouth daily with breakfast. 02/20/15  Yes Josalyn Funches, MD  ranitidine (ZANTAC) 150 MG capsule Take 1 capsule (150 mg total) by mouth 2 (two) times daily. 03/16/15  Yes Lori P Hvozdovic, [name]  Insulin Pen Needle (B-D ULTRAFINE III SHORT PEN) 31G X 8 MM MISC 1 application by Does not apply route daily. 02/23/15   Josalyn Funches, MD  sildenafil (VIAGRA) 100 MG tablet Take 100 mg by mouth daily as needed for erectile dysfunction.    Historical Provider, MD   BP 165/97 mmHg  Pulse 79  Temp(Src) 98.2 F (36.8 C) (Oral)  Resp 16  SpO2 96% Physical Exam  Constitutional: He is oriented to person, place, and time. He  appears well-developed and well-nourished. No distress.  HENT:  Mouth/Throat: Oropharynx is clear and moist.  Eyes: Conjunctivae are normal. No scleral icterus.  Neck: Neck supple. No tracheal deviation present.  Cardiovascular: Normal rate, regular rhythm, normal heart sounds and intact distal pulses.   Pulmonary/Chest: Effort normal and breath sounds normal. No accessory muscle usage. No respiratory distress.  Abdominal: Soft. He exhibits no distension. There is no tenderness.  Musculoskeletal: Normal range of motion.  Mild swelling, mod tenderness, sl inc warmth to medial aspect left foot, esp focused around 1st mtp region felt most c/w gout. No cellulitis. Dp/pt 2+ bil feet. Normal cap refill distally in toes. No lymphangitis. No calf or lower leg pain or swelling.   Neurological: He is alert and oriented to person, place, and  time.  Able to move bil feet and toes, sens grossly intact.    Skin: Skin is warm and dry. No rash noted. He is not diaphoretic.  Psychiatric:  Anxious appearing.   Nursing note and vitals reviewed.   ED Course  Procedures (including critical care time) Labs Review       MDM   Reviewed nursing notes and prior charts for additional history.   Exam felt most c/w acute gout.  Solumedrol im.  vicodin po.    Pt also quite anxious appearing, will give xanax .5 mg po.  Recheck pain improved.  Pt currently appears stable for d/c.  Return precautions including fevers, intractable pain, increased/spreading redness.   rec close pcp f/u.    Lajean Saver, MD 04/13/15 1032

## 2015-04-14 ENCOUNTER — Emergency Department (HOSPITAL_COMMUNITY)
Admission: EM | Admit: 2015-04-14 | Discharge: 2015-04-15 | Disposition: A | Discharge status: Home / Self Care | Payer: Medicaid Other | Attending: Emergency Medicine | Admitting: Emergency Medicine

## 2015-04-14 ENCOUNTER — Emergency Department (HOSPITAL_COMMUNITY): Payer: Medicaid Other

## 2015-04-14 DIAGNOSIS — Z79899 Other long term (current) drug therapy: Secondary | ICD-10-CM | POA: Insufficient documentation

## 2015-04-14 DIAGNOSIS — Z7982 Long term (current) use of aspirin: Secondary | ICD-10-CM | POA: Diagnosis not present

## 2015-04-14 DIAGNOSIS — E876 Hypokalemia: Secondary | ICD-10-CM | POA: Insufficient documentation

## 2015-04-14 DIAGNOSIS — Z9889 Other specified postprocedural states: Secondary | ICD-10-CM | POA: Insufficient documentation

## 2015-04-14 DIAGNOSIS — I480 Paroxysmal atrial fibrillation: Secondary | ICD-10-CM | POA: Insufficient documentation

## 2015-04-14 DIAGNOSIS — I10 Essential (primary) hypertension: Secondary | ICD-10-CM | POA: Insufficient documentation

## 2015-04-14 DIAGNOSIS — I4581 Long QT syndrome: Secondary | ICD-10-CM | POA: Insufficient documentation

## 2015-04-14 DIAGNOSIS — Z86010 Personal history of colonic polyps: Secondary | ICD-10-CM | POA: Diagnosis not present

## 2015-04-14 DIAGNOSIS — E1165 Type 2 diabetes mellitus with hyperglycemia: Secondary | ICD-10-CM | POA: Insufficient documentation

## 2015-04-14 DIAGNOSIS — R9431 Abnormal electrocardiogram [ECG] [EKG]: Secondary | ICD-10-CM

## 2015-04-14 DIAGNOSIS — Z794 Long term (current) use of insulin: Secondary | ICD-10-CM | POA: Diagnosis not present

## 2015-04-14 DIAGNOSIS — G8929 Other chronic pain: Secondary | ICD-10-CM | POA: Diagnosis not present

## 2015-04-14 DIAGNOSIS — M069 Rheumatoid arthritis, unspecified: Secondary | ICD-10-CM | POA: Insufficient documentation

## 2015-04-14 DIAGNOSIS — R739 Hyperglycemia, unspecified: Secondary | ICD-10-CM

## 2015-04-14 DIAGNOSIS — I2510 Atherosclerotic heart disease of native coronary artery without angina pectoris: Secondary | ICD-10-CM | POA: Diagnosis not present

## 2015-04-14 LAB — I-STAT CHEM 8, ED
BUN: 20 mg/dL (ref 6–20)
CALCIUM ION: 1.07 mmol/L — AB (ref 1.13–1.30)
CHLORIDE: 91 mmol/L — AB (ref 101–111)
Creatinine, Ser: 1.2 mg/dL (ref 0.61–1.24)
GLUCOSE: 651 mg/dL — AB (ref 65–99)
HEMATOCRIT: 45 % (ref 39.0–52.0)
HEMOGLOBIN: 15.3 g/dL (ref 13.0–17.0)
Potassium: 3.1 mmol/L — ABNORMAL LOW (ref 3.5–5.1)
SODIUM: 135 mmol/L (ref 135–145)
TCO2: 26 mmol/L (ref 0–100)

## 2015-04-14 LAB — URINALYSIS, ROUTINE W REFLEX MICROSCOPIC
BILIRUBIN URINE: NEGATIVE
Hgb urine dipstick: NEGATIVE
KETONES UR: NEGATIVE mg/dL
LEUKOCYTES UA: NEGATIVE
NITRITE: NEGATIVE
PROTEIN: NEGATIVE mg/dL
Specific Gravity, Urine: 1.025 (ref 1.005–1.030)
Urobilinogen, UA: 0.2 mg/dL (ref 0.0–1.0)
pH: 6 (ref 5.0–8.0)

## 2015-04-14 LAB — CBC WITH DIFFERENTIAL/PLATELET
BASOS ABS: 0 10*3/uL (ref 0.0–0.1)
Basophils Relative: 0 % (ref 0–1)
EOS ABS: 0 10*3/uL (ref 0.0–0.7)
Eosinophils Relative: 0 % (ref 0–5)
HCT: 40.9 % (ref 39.0–52.0)
HEMOGLOBIN: 13.4 g/dL (ref 13.0–17.0)
LYMPHS PCT: 6 % — AB (ref 12–46)
Lymphs Abs: 1.3 10*3/uL (ref 0.7–4.0)
MCH: 31.2 pg (ref 26.0–34.0)
MCHC: 32.8 g/dL (ref 30.0–36.0)
MCV: 95.3 fL (ref 78.0–100.0)
MONO ABS: 1.7 10*3/uL — AB (ref 0.1–1.0)
Monocytes Relative: 8 % (ref 3–12)
NEUTROS ABS: 17.9 10*3/uL — AB (ref 1.7–7.7)
Neutrophils Relative %: 86 % — ABNORMAL HIGH (ref 43–77)
PLATELETS: 280 10*3/uL (ref 150–400)
RBC: 4.29 MIL/uL (ref 4.22–5.81)
RDW: 14.2 % (ref 11.5–15.5)
WBC: 20.9 10*3/uL — ABNORMAL HIGH (ref 4.0–10.5)

## 2015-04-14 LAB — BASIC METABOLIC PANEL
ANION GAP: 14 (ref 5–15)
BUN: 19 mg/dL (ref 6–20)
CALCIUM: 9 mg/dL (ref 8.9–10.3)
CO2: 27 mmol/L (ref 22–32)
Chloride: 94 mmol/L — ABNORMAL LOW (ref 101–111)
Creatinine, Ser: 1.32 mg/dL — ABNORMAL HIGH (ref 0.61–1.24)
GFR calc Af Amer: >60 mL/min (ref >60)
GFR, EST NON AFRICAN AMERICAN: 57 mL/min — AB (ref >60)
Glucose, Bld: 610 mg/dL (ref 65–99)
POTASSIUM: 3.1 mmol/L — AB (ref 3.5–5.1)
SODIUM: 135 mmol/L (ref 135–145)

## 2015-04-14 LAB — CBG MONITORING, ED
Glucose-Capillary: >600 mg/dL (ref 65–99)
Glucose-Capillary: 366 mg/dL — ABNORMAL HIGH (ref 65–99)

## 2015-04-14 LAB — URINE MICROSCOPIC-ADD ON

## 2015-04-14 MED ORDER — POTASSIUM CHLORIDE 10 MEQ/100ML IV SOLN
10.0000 meq | Freq: Once | INTRAVENOUS | Status: AC
  Administered 2015-04-14: 10 meq via INTRAVENOUS
  Filled 2015-04-14: qty 100

## 2015-04-14 MED ORDER — MAGNESIUM SULFATE 2 GM/50ML IV SOLN
2.0000 g | Freq: Once | INTRAVENOUS | Status: AC
  Administered 2015-04-14: 2 g via INTRAVENOUS
  Filled 2015-04-14: qty 50

## 2015-04-14 MED ORDER — POTASSIUM CHLORIDE CRYS ER 20 MEQ PO TBCR
40.0000 meq | EXTENDED_RELEASE_TABLET | Freq: Once | ORAL | Status: AC
  Administered 2015-04-14: 40 meq via ORAL
  Filled 2015-04-14: qty 2

## 2015-04-14 MED ORDER — SODIUM CHLORIDE 0.9 % IV BOLUS (SEPSIS)
2000.0000 mL | Freq: Once | INTRAVENOUS | Status: AC
  Administered 2015-04-14: 2000 mL via INTRAVENOUS

## 2015-04-14 MED ORDER — INSULIN ASPART 100 UNIT/ML ~~LOC~~ SOLN: 10.0000 [IU] | Freq: Once | SUBCUTANEOUS | Status: DC

## 2015-04-14 MED ORDER — INSULIN ASPART 100 UNIT/ML ~~LOC~~ SOLN
10.0000 [IU] | Freq: Once | SUBCUTANEOUS | Status: AC
  Administered 2015-04-14: 10 [IU] via SUBCUTANEOUS
  Filled 2015-04-14: qty 1

## 2015-04-14 NOTE — ED Notes (Signed)
Pt from home via EMS- Per EMS pt reports was seen yesterday and was rx'd prednisone, started taking today. Pt reports that he checked today and was high. Pt wants evaluated for hyperglycemia. Pt is A&O and in NAD and has no other c/o

## 2015-04-14 NOTE — ED Provider Notes (Addendum)
CSN: [id number]     Arrival date & time 04/14/15  7782 History   First MD Initiated Contact with Patient 04/14/15 1827     Chief Complaint  Patient presents with  . Hyperglycemia     (Consider location/radiation/quality/duration/timing/severity/associated sxs/prior Treatment) Patient is a 59 y.o. male presenting with hyperglycemia.  Hyperglycemia Blood sugar level PTA:  'High' Severity:  Moderate Onset quality:  Sudden Chronicity:  New Diabetes status:  Controlled with insulin Context: not change in medication, not insulin pump use and not noncompliance   Relieved by:  None tried Ineffective treatments:  None tried Associated symptoms: no abdominal pain, no altered mental status, no chest pain, no dizziness, no dysuria, no fatigue, no increased thirst, no polyuria and no shortness of breath     Past Medical History  Diagnosis Date  . Non-obstructive CAD     a. 02/2013 Cath ([place]): nonobs dzs;  b. 09/2014 Myoview ([place]): EF 55%, no ischemia;  c. 12/2014 Echo: EF 50-55%, gr1 DD, no effusion.  . Essential hypertension   . Diabetes mellitus without complication   . Rheumatoid arthritis   . Chronic pain   . PAF (paroxysmal atrial fibrillation)     a. 02/2013 s/p rfca in [place], [place]-->prev on Xarelto, d/c'd 2/2 anemia, ? GIB.  [name] Benign colon polyp 08/01/2013    [facility] in [place]. large base tranverse colon polyp was biopsied. polyp was benign with minimal surface hyperplastic change.  . Morbid obesity   . Gastritis 07/29/2013    confirmed on EGD, bx done an negative for intestinal metaplasia, dsyplasia or H. pylori. normal gastric emptying study done 07/13/2013.  [name] Esophageal hiatal hernia 07/29/2013    confirmed on EGD   . Sigmoid diverticulosis 08/01/2013    confirmed on colonscopy. record scanned into chart   Past Surgical History  Procedure Laterality Date  . Colon surgery  09/2014    colon resection   . Cardiac catheterization  05/2014    ablation for atrial  fibrillation  . Colon resection  09/2013    due to large, abnormal polpy. non cancerous per patient.    Family History  Problem Relation Age of Onset  . Hypertension Mother   . Diabetes Mother   . Cancer Mother   . COPD Mother   . Hypertension Father   . Diabetes Father   . Heart Problems Father   . COPD Father    Social History  Substance Use Topics  . Smoking status: Never Smoker   . Smokeless tobacco: Not on file  . Alcohol Use: No    Review of Systems  Constitutional: Negative for chills and fatigue.  HENT: Negative for congestion.   Eyes: Negative for pain.  Respiratory: Negative for cough and shortness of breath.   Cardiovascular: Negative for chest pain.  Gastrointestinal: Negative for abdominal pain.  Endocrine: Negative for polydipsia and polyuria.  Genitourinary: Negative for dysuria, urgency and hematuria.  Musculoskeletal: Negative for back pain, gait problem and neck pain.  Skin: Negative for pallor.  Allergic/Immunologic: Negative for environmental allergies.  Neurological: Negative for dizziness.  All other systems reviewed and are negative.     Allergies  Review of patient's allergies indicates no known allergies.  Home Medications   Prior to Admission medications   Medication Sig Start Date End Date Taking? Authorizing Provider  acetaminophen-codeine (TYLENOL #3) 300-30 MG per tablet Take 1 tablet by mouth every 8 (eight) hours as needed for moderate pain. 03/01/15  Yes Boykin Nearing, MD  aspirin EC 81 MG tablet Take 1 tablet (81 mg total) by mouth daily. 03/30/15  Yes Tiffany Daneil Dan, [name]  atorvastatin (LIPITOR) 40 MG tablet Take 1 tablet (40 mg total) by mouth daily. 03/30/15  Yes Tiffany Daneil Dan, [name]  carvedilol (COREG) 12.5 MG tablet Take 12.5 mg by mouth 2 (two) times daily with a meal.   Yes Historical Provider, MD  diltiazem (DILACOR XR) 180 MG 24 hr capsule Take 1 capsule (180 mg total) by mouth daily. 03/30/15  Yes Tiffany Daneil Dan, [name]   furosemide (LASIX) 40 MG tablet Take 1 tablet (40 mg total) by mouth daily. 03/30/15  Yes Tiffany Daneil Dan, [name]  gabapentin (NEURONTIN) 300 MG capsule Take 2 capsules (600 mg total) by mouth 3 (three) times daily. 04/13/15  Yes Boykin Nearing, MD  HYDROcodone-acetaminophen (NORCO/VICODIN) 5-325 MG per tablet Take 1-2 tablets by mouth every 6 (six) hours as needed for moderate pain or severe pain. 04/13/15  Yes Lajean Saver, MD  hyoscyamine (LEVSIN SL) 0.125 MG SL tablet Place 1 tablet (0.125 mg total) under the tongue every 6 (six) hours as needed. Patient taking differently: Place 0.125 mg under the tongue every 6 (six) hours as needed for cramping.  03/16/15  Yes Lori P Hvozdovic, [name]  insulin aspart protamine - aspart (NOVOLOG MIX 70/30 FLEXPEN) (70-30) 100 UNIT/ML FlexPen Inject 0.15 mLs (15 Units total) into the skin 2 (two) times daily with a meal. 02/23/15  Yes Josalyn Funches, MD  Insulin Detemir (LEVEMIR FLEXPEN) 100 UNIT/ML Pen Inject 60 Units into the skin daily at 10 pm. 02/23/15  Yes Josalyn Funches, MD  Insulin Pen Needle (B-D ULTRAFINE III SHORT PEN) 31G X 8 MM MISC 1 application by Does not apply route daily. 02/23/15  Yes Josalyn Funches, MD  lisinopril (PRINIVIL,ZESTRIL) 30 MG tablet Take 1 tablet (30 mg total) by mouth daily. 03/30/15  Yes Tiffany Daneil Dan, [name]  nystatin (MYCOSTATIN) 100000 UNIT/ML suspension Take 5 mLs (500,000 Units total) by mouth 4 (four) times daily. 03/19/15  Yes Josalyn Funches, MD  pantoprazole (PROTONIX) 40 MG tablet Take 1 tablet (40 mg total) by mouth 2 (two) times daily. 03/16/15  Yes Lori P Hvozdovic, [name]  ranitidine (ZANTAC) 150 MG capsule Take 1 capsule (150 mg total) by mouth 2 (two) times daily. 03/16/15  Yes Lori P Hvozdovic, [name]  sildenafil (VIAGRA) 100 MG tablet Take 100 mg by mouth daily as needed for erectile dysfunction.   Yes Historical Provider, MD  potassium chloride SA (K-DUR,KLOR-CON) 20 MEQ tablet Take 2 tablets (40 mEq total) by mouth 2 (two) times  daily. 04/15/15 04/23/15  Corene Cornea Lakyn Mantione, MD   BP 152/77 mmHg  Pulse 76  Temp(Src) 98 F (36.7 C) (Oral)  Resp 16  SpO2 93% Physical Exam  Constitutional: He is oriented to person, place, and time. He appears well-developed and well-nourished.  HENT:  Head: Normocephalic and atraumatic.  Eyes: Conjunctivae are normal. Pupils are equal, round, and reactive to light.  Cardiovascular: Normal rate and regular rhythm.   Pulmonary/Chest: Effort normal and breath sounds normal.  Abdominal: Soft. He exhibits no distension. There is no tenderness.  Musculoskeletal: Normal range of motion.  Neurological: He is alert and oriented to person, place, and time.  Nursing note and vitals reviewed.   ED Course  Procedures (including critical care time) Labs Review Labs Reviewed  BASIC METABOLIC PANEL - Abnormal; Notable for the following:    Potassium 3.1 (*)    Chloride 94 (*)    Glucose,  Bld 610 (*)    Creatinine, Ser 1.32 (*)    GFR calc non Af Amer 57 (*)    All other components within normal limits  URINALYSIS, ROUTINE W REFLEX MICROSCOPIC (NOT AT [facility]) - Abnormal; Notable for the following:    Glucose, UA >1000 (*)    All other components within normal limits  CBC WITH DIFFERENTIAL/PLATELET - Abnormal; Notable for the following:    WBC 20.9 (*)    Neutrophils Relative % 86 (*)    Lymphocytes Relative 6 (*)    Neutro Abs 17.9 (*)    Monocytes Absolute 1.7 (*)    All other components within normal limits  CBG MONITORING, ED - Abnormal; Notable for the following:    Glucose-Capillary >600 (*)    All other components within normal limits  I-STAT CHEM 8, ED - Abnormal; Notable for the following:    Potassium 3.1 (*)    Chloride 91 (*)    Glucose, Bld 651 (*)    Calcium, Ion 1.07 (*)    All other components within normal limits  CBG MONITORING, ED - Abnormal; Notable for the following:    Glucose-Capillary 366 (*)    All other components within normal limits  I-STAT CHEM 8, ED -  Abnormal; Notable for the following:    Potassium 3.1 (*)    Chloride 99 (*)    Glucose, Bld 338 (*)    Calcium, Ion 1.05 (*)    All other components within normal limits  URINE CULTURE  URINE MICROSCOPIC-ADD ON    Imaging Review Dg Chest 2 View  04/14/2015   CLINICAL DATA:  Cough.  Smoker.  EXAM: CHEST  2 VIEW  COMPARISON:  [facility] 2016.  FINDINGS: Normal sized heart. Clear lungs. Thoracic spine degenerative changes. Small amount of anterior linear scarring.  IMPRESSION: No acute abnormality.   Electronically Signed   By: Claudie Revering M.D.   On: 04/14/2015 20:37   I have personally reviewed and evaluated these images and lab results as part of my medical decision-making.   EKG Interpretation   Date/Time:  Saturday April 14 2015 18:35:48 EDT Ventricular Rate:  73 PR Interval:  127 QRS Duration: 104 QT Interval:  516 QTC Calculation: 569 R Axis:   9 Text Interpretation:  Sinus rhythm Ventricular premature complex Abnormal  R-wave progression, early transition LVH with secondary repolarization  abnormality Prolonged QT interval Confirmed by [facility] MD, Corene Cornea [phone]) on  04/14/2015 7:37:44 PM      MDM   Final diagnoses:  Hyperglycemia  Hypokalemia  Prolonged Q-T interval on ECG   Hyperglycemia likely 2/2 steroid administration yesterday. No evidence of infection. Left ankle with pain. Minimal effusion ultrasound no pain with ROM. Doubt septic joint. Hypokalemic and also with prolonged QT on ECG, improved with potassium and Mg administration. Not here for synocpe or chest pain or other related symptoms, so will follow up with PCP in 1 week for repeat potassium check and ecg. Hyperglycemia improved with fluids and insulin. Will likely continue to improve with long acting insulin at home.   I have personally and contemperaneously reviewed labs and imaging and used in my decision making as above.   A medical screening exam was performed and I feel the patient has had an  appropriate workup for their chief complaint at this time and likelihood of emergent condition existing is low. They have been counseled on decision, discharge, follow up and which symptoms necessitate immediate return to the emergency department. They or their  family verbally stated understanding and agreement with plan and discharged in stable condition.      Merrily Pew, MD 04/15/15 [id number]  Merrily Pew, MD 05/01/15 [phone]

## 2015-04-14 NOTE — ED Notes (Signed)
Family at bedside. 

## 2015-04-14 NOTE — ED Notes (Signed)
UNABLE TO GET ANY BLOOD

## 2015-04-14 NOTE — ED Notes (Signed)
Bed: WA17 Expected date:  Expected time:  Means of arrival:  Comments: Hyperglycemia

## 2015-04-14 NOTE — ED Notes (Signed)
Patient transported to X-ray 

## 2015-04-15 LAB — I-STAT CHEM 8, ED
BUN: 16 mg/dL (ref 6–20)
CALCIUM ION: 1.05 mmol/L — AB (ref 1.13–1.30)
CREATININE: 1 mg/dL (ref 0.61–1.24)
Chloride: 99 mmol/L — ABNORMAL LOW (ref 101–111)
GLUCOSE: 338 mg/dL — AB (ref 65–99)
HCT: 42 % (ref 39.0–52.0)
HEMOGLOBIN: 14.3 g/dL (ref 13.0–17.0)
Potassium: 3.1 mmol/L — ABNORMAL LOW (ref 3.5–5.1)
Sodium: 142 mmol/L (ref 135–145)
TCO2: 28 mmol/L (ref 0–100)

## 2015-04-15 MED ORDER — POTASSIUM CHLORIDE CRYS ER 20 MEQ PO TBCR: 40.0000 meq | EXTENDED_RELEASE_TABLET | Freq: Two times a day (BID) | ORAL | Status: DC

## 2015-04-15 NOTE — ED Notes (Signed)
Pt discharged to home

## 2015-04-16 LAB — URINE CULTURE

## 2015-04-17 ENCOUNTER — Telehealth: Payer: Self-pay | Admitting: Internal Medicine

## 2015-04-17 ENCOUNTER — Telehealth: Payer: Self-pay | Admitting: Family Medicine

## 2015-04-17 NOTE — Telephone Encounter (Signed)
See results note. 

## 2015-04-17 NOTE — Telephone Encounter (Signed)
Patient came in and was given his colonoscopy instructions, which is scheduled for 04/19/2015.  Patient acknowledged and understood

## 2015-04-17 NOTE — Telephone Encounter (Signed)
Patient called trying to schedule a HFU. He was in the ED for Hyperglycemia on 9/3 and is a patient of dr Adrian Blackwater. Dr Adrian Blackwater does not have anything available until 3 weeks and pt is hoping to be seen sooner. I checked to see if dr Jarold Song had anything available but their are no 30 minute appointments available within this week. Please follow up with pt if we are able to fit him in sooner. Thank you.

## 2015-04-19 ENCOUNTER — Encounter: Payer: Self-pay | Admitting: Internal Medicine

## 2015-04-19 ENCOUNTER — Ambulatory Visit (AMBULATORY_SURGERY_CENTER): Payer: Medicaid Other | Admitting: Internal Medicine

## 2015-04-19 VITALS — BP 143/102 | HR 78 | Temp 98.8°F | Resp 13 | Ht 66.0 in | Wt 222.0 lb

## 2015-04-19 DIAGNOSIS — D123 Benign neoplasm of transverse colon: Secondary | ICD-10-CM | POA: Diagnosis not present

## 2015-04-19 DIAGNOSIS — K219 Gastro-esophageal reflux disease without esophagitis: Secondary | ICD-10-CM

## 2015-04-19 DIAGNOSIS — R197 Diarrhea, unspecified: Secondary | ICD-10-CM

## 2015-04-19 DIAGNOSIS — R1310 Dysphagia, unspecified: Secondary | ICD-10-CM | POA: Diagnosis not present

## 2015-04-19 DIAGNOSIS — Z86010 Personal history of colonic polyps: Secondary | ICD-10-CM

## 2015-04-19 LAB — GLUCOSE, CAPILLARY
GLUCOSE-CAPILLARY: 182 mg/dL — AB (ref 65–99)
Glucose-Capillary: 207 mg/dL — ABNORMAL HIGH (ref 65–99)

## 2015-04-19 LAB — PANCREATIC ELASTASE, FECAL

## 2015-04-19 MED ORDER — SODIUM CHLORIDE 0.9 % IV SOLN: 500.0000 mL | INTRAVENOUS | Status: DC

## 2015-04-19 NOTE — Op Note (Signed)
[place]  [address] [place], [address]   ENDOSCOPY PROCEDURE REPORT  PATIENT: Marco [name], Marco [name]  MR#: [id number] BIRTHDATE: 1956/05/11 , 54  yrs. old GENDER: male ENDOSCOPIST: Eustace Quail, MD REFERRED BY:  Boykin Nearing, MD PROCEDURE DATE:  04/19/2015 PROCEDURE:  EGD, diagnostic and Maloney dilation of esophagus   -52f ASA CLASS:     Class III INDICATIONS:  chest pain, dysphagia, and history of esophageal reflux. MEDICATIONS: Monitored anesthesia care and Propofol 90 mg IV TOPICAL ANESTHETIC: none  DESCRIPTION OF PROCEDURE: After the risks benefits and alternatives of the procedure were thoroughly explained, informed consent was obtained.  The LB [id number] endoscope was introduced through the mouth and advanced to the second portion of the duodenum , Without limitations.  The instrument was slowly withdrawn as the mucosa was fully examined.   EXAM:The esophagus, stomach, and duodenum were normal. Retroflexed views revealed a hiatal hernia.     The scope was then withdrawn from the patient and the procedure completed. Therapy: A 54 French Maloney dilator was passed without resistance or heme. Tolerated well. COMPLICATIONS: There were no immediate complications.  ENDOSCOPIC IMPRESSION: 1. GERD 2. Normal EGD 3. Status post empiric esophageal dilation, for chronic dysphagia 4. Poorly controlled diabetes  RECOMMENDATIONS: 1.  Clear liquids until 2 PM, then soft foods rest of day.  Resume prior diet tomorrow. 2.  Anti-reflux regimen to be followed. The nurse will hand you about relevant information 3.  Continue current medications for acid reflux 4. You must work with your primary care doctor regarding better control of her diabetes. This will help your GI tract 5. Make a follow-up office appointment with Dr. Henrene Pastor about 2 months. May consider esophageal manometry  REPEAT EXAM:  eSigned:  Eustace Quail, MD 04/19/2015 12:59  PM    CC:The Patient and Boykin Nearing, MD

## 2015-04-19 NOTE — Progress Notes (Signed)
Report to PACU, RN, vss, BBS= Clear.  

## 2015-04-19 NOTE — Op Note (Signed)
[place]  [address]. [place], [address]   COLONOSCOPY PROCEDURE REPORT  PATIENT: Marco [name], Marco [name]  MR#: [id number] BIRTHDATE: 1955-11-09 , 7  yrs. old GENDER: male ENDOSCOPIST: Eustace Quail, MD REFERRED ZE:[id number] Funches, MD PROCEDURE DATE:  04/19/2015 PROCEDURE:   Colonoscopy with biopsies and Colonoscopy with snare polypectomy x 2 First Screening Colonoscopy - Avg.  risk and is 50 yrs.  old or older - No.  Prior Negative Screening - Now for repeat screening. N/A  History of Adenoma - Now for follow-up colonoscopy & has been > or = to 3 yrs.  N/A  Polyps removed today? Yes ASA CLASS:   Class III INDICATIONS:unexplained diarrhea.. The patient had a large transverse colon polyp in  late 2014. Biopsies were benign non-adenomatous. Patient underwent partial colonic resection (we believe for this lesion) without that surgical pathology available. Has been having problems with diarrhea since. MEDICATIONS: Monitored anesthesia care and Propofol 200 mg IV DESCRIPTION OF PROCEDURE:   After the risks benefits and alternatives of the procedure were thoroughly explained, informed consent was obtained.  The digital rectal exam revealed no abnormalities of the rectum.   The LB [id number]  endoscope was introduced through the anus and advanced to the surgical anastomosis. No adverse events experienced.   The quality of the prep was (MiraLax was used) excellent.  The instrument was then slowly withdrawn as the colon was fully examined. Estimated blood loss is zero unless otherwise noted in this procedure report.  COLON FINDINGS: The colonoscope was advanced to the surgical anastomosis.  This appeared unremarkable.  The neo-ileum was intubated.  Normal.  2 diminutive transverse colon polyps were removed with cold snare and submitted to pathology.  There was moderate diverticulosis involving the left half of the colon.  No other abnormalities.  Random colon  biopsies taken to evaluate unexplained diarrhea.  Retroflexed views revealed internal hemorrhoids. The time to cecum = 1.6 Withdrawal time = 8.7   The scope was withdrawn and the procedure completed. COMPLICATIONS: There were no immediate complications. ENDOSCOPIC IMPRESSION: 1. Status post right hemicolectomy 2. Two diminutive transverse colon polyp status post cold snare polypectomy 3. Moderate colonic diverticulosis  RECOMMENDATIONS: 1.  Follow up colonoscopy in 5 years 2.  Await biopsy results 3.  Upper endoscopy (today, please see report) 4. Recommend Imodium for diarrhea. Take as directed  eSigned:  Eustace Quail, MD 04/19/2015 12:51 PM   cc: The Patient and Boykin Nearing, MD

## 2015-04-19 NOTE — Patient Instructions (Addendum)
Impressions/recommendations:  Polyps (handout given) Diverticulosis (handout given) High Fiber Diet (handout given)  Dilation diet (handout given) Anti-reflux regimen handout given. Continue current medications.  See Dr. Henrene Pastor in his office in two months.  Work with your primary care to get your diabetes under better control.  Next colonoscopy 5 years-2021.  YOU HAD AN ENDOSCOPIC PROCEDURE TODAY AT [facility]:   Refer to the procedure report that was given to you for any specific questions about what was found during the examination.  If the procedure report does not answer your questions, please call your gastroenterologist to clarify.  If you requested that your care partner not be given the details of your procedure findings, then the procedure report has been included in a sealed envelope for you to review at your convenience later.  YOU SHOULD EXPECT: Some feelings of bloating in the abdomen. Passage of more gas than usual.  Walking can help get rid of the air that was put into your GI tract during the procedure and reduce the bloating. If you had a lower endoscopy (such as a colonoscopy or flexible sigmoidoscopy) you may notice spotting of blood in your stool or on the toilet paper. If you underwent a bowel prep for your procedure, you may not have a normal bowel movement for a few days.  Please Note:  You might notice some irritation and congestion in your nose or some drainage.  This is from the oxygen used during your procedure.  There is no need for concern and it should clear up in a day or so.  SYMPTOMS TO REPORT IMMEDIATELY:   Following lower endoscopy (colonoscopy or flexible sigmoidoscopy):  Excessive amounts of blood in the stool  Significant tenderness or worsening of abdominal pains  Swelling of the abdomen that is new, acute  Fever of 100F or higher   Following upper endoscopy (EGD)  Vomiting of blood or coffee ground material  New chest pain or  pain under the shoulder blades  Painful or persistently difficult swallowing  New shortness of breath  Fever of 100F or higher  Black, tarry-looking stools  For urgent or emergent issues, a gastroenterologist can be reached at any hour by calling [phone].   DIET: Your first meal following the procedure should be a small meal and then it is ok to progress to your normal diet. Heavy or fried foods are harder to digest and may make you feel nauseous or bloated.  Likewise, meals heavy in dairy and vegetables can increase bloating.  Drink plenty of fluids but you should avoid alcoholic beverages for 24 hours.  ACTIVITY:  You should plan to take it easy for the rest of today and you should NOT DRIVE or use heavy machinery until tomorrow (because of the sedation medicines used during the test).    FOLLOW UP: Our staff will call the number listed on your records the next business day following your procedure to check on you and address any questions or concerns that you may have regarding the information given to you following your procedure. If we do not reach you, we will leave a message.  However, if you are feeling well and you are not experiencing any problems, there is no need to return our call.  We will assume that you have returned to your regular daily activities without incident.  If any biopsies were taken you will be contacted by phone or by letter within the next 1-3 weeks.  Please call us at [phone] if you have not heard about the biopsies in 3 weeks.    SIGNATURES/CONFIDENTIALITY: You and/or your care partner have signed paperwork which will be entered into your electronic medical record.  These signatures attest to the fact that that the information above on your After Visit Summary has been reviewed and is understood.  Full responsibility of the confidentiality of this discharge information lies with you and/or your care-partner.

## 2015-04-19 NOTE — Progress Notes (Signed)
Called to room to assist during endoscopic procedure.  Patient ID and intended procedure confirmed with present staff. Received instructions for my participation in the procedure from the performing physician.  

## 2015-04-20 ENCOUNTER — Telehealth: Payer: Self-pay

## 2015-04-20 NOTE — Telephone Encounter (Signed)
No answer, left voicemail

## 2015-04-24 ENCOUNTER — Encounter: Payer: Self-pay | Admitting: Internal Medicine

## 2015-04-27 ENCOUNTER — Other Ambulatory Visit: Payer: Self-pay | Admitting: Family Medicine

## 2015-05-16 ENCOUNTER — Encounter (HOSPITAL_BASED_OUTPATIENT_CLINIC_OR_DEPARTMENT_OTHER): Payer: Medicaid Other | Admitting: Clinical

## 2015-05-16 ENCOUNTER — Ambulatory Visit: Payer: Medicaid Other | Attending: Family Medicine | Admitting: Family Medicine

## 2015-05-16 ENCOUNTER — Encounter: Payer: Self-pay | Admitting: Family Medicine

## 2015-05-16 VITALS — BP 132/82 | HR 76 | Temp 98.5°F | Resp 16 | Ht 66.0 in | Wt 222.0 lb

## 2015-05-16 DIAGNOSIS — Z79899 Other long term (current) drug therapy: Secondary | ICD-10-CM | POA: Insufficient documentation

## 2015-05-16 DIAGNOSIS — F329 Major depressive disorder, single episode, unspecified: Secondary | ICD-10-CM | POA: Diagnosis not present

## 2015-05-16 DIAGNOSIS — Z7982 Long term (current) use of aspirin: Secondary | ICD-10-CM | POA: Insufficient documentation

## 2015-05-16 DIAGNOSIS — N644 Mastodynia: Secondary | ICD-10-CM | POA: Insufficient documentation

## 2015-05-16 DIAGNOSIS — G8929 Other chronic pain: Secondary | ICD-10-CM | POA: Diagnosis not present

## 2015-05-16 DIAGNOSIS — Z1159 Encounter for screening for other viral diseases: Secondary | ICD-10-CM | POA: Diagnosis not present

## 2015-05-16 DIAGNOSIS — F32A Depression, unspecified: Secondary | ICD-10-CM | POA: Insufficient documentation

## 2015-05-16 DIAGNOSIS — Z794 Long term (current) use of insulin: Secondary | ICD-10-CM | POA: Insufficient documentation

## 2015-05-16 DIAGNOSIS — Z7952 Long term (current) use of systemic steroids: Secondary | ICD-10-CM | POA: Insufficient documentation

## 2015-05-16 DIAGNOSIS — I10 Essential (primary) hypertension: Secondary | ICD-10-CM | POA: Insufficient documentation

## 2015-05-16 DIAGNOSIS — R7989 Other specified abnormal findings of blood chemistry: Secondary | ICD-10-CM

## 2015-05-16 DIAGNOSIS — E291 Testicular hypofunction: Secondary | ICD-10-CM

## 2015-05-16 DIAGNOSIS — E119 Type 2 diabetes mellitus without complications: Secondary | ICD-10-CM | POA: Diagnosis not present

## 2015-05-16 LAB — POCT URINALYSIS DIPSTICK
Bilirubin, UA: NEGATIVE
Blood, UA: NEGATIVE
Glucose, UA: 500
KETONES UA: NEGATIVE
LEUKOCYTES UA: NEGATIVE
NITRITE UA: NEGATIVE
PH UA: 5.5
PROTEIN UA: NEGATIVE
Spec Grav, UA: 1.015
UROBILINOGEN UA: 0.2

## 2015-05-16 LAB — GLUCOSE, POCT (MANUAL RESULT ENTRY)
POC GLUCOSE: 286 mg/dL — AB (ref 70–99)
POC Glucose: 315 mg/dl — AB (ref 70–99)

## 2015-05-16 LAB — POCT GLYCOSYLATED HEMOGLOBIN (HGB A1C): HEMOGLOBIN A1C: 11.9

## 2015-05-16 LAB — URIC ACID: URIC ACID, SERUM: 7.7 mg/dL (ref 4.0–7.8)

## 2015-05-16 MED ORDER — INSULIN ASPART PROT & ASPART (70-30 MIX) 100 UNIT/ML PEN: 20.0000 [IU] | PEN_INJECTOR | Freq: Two times a day (BID) | SUBCUTANEOUS | Status: DC

## 2015-05-16 MED ORDER — ACETAMINOPHEN-CODEINE #3 300-30 MG PO TABS
1.0000 | ORAL_TABLET | Freq: Three times a day (TID) | ORAL | Status: DC | PRN
Start: 2015-05-16 — End: 2015-07-12

## 2015-05-16 MED ORDER — INSULIN ASPART 100 UNIT/ML ~~LOC~~ SOLN
10.0000 [IU] | Freq: Once | SUBCUTANEOUS | Status: AC
  Administered 2015-05-16: 10 [IU] via SUBCUTANEOUS

## 2015-05-16 NOTE — Progress Notes (Signed)
F/U DM HTN, Depression Chronic chest pain SHOB Stated still with SHOB and tightness of chest  Stated some days are worse then other  Unable to sleep, Tylenol PM not helping as much  No Hx tobacco  Glucose running between 150-210 fasting  Pain scale #7

## 2015-05-16 NOTE — Patient Instructions (Addendum)
Delance was seen today for diabetes.  Diagnoses and all orders for this visit:  Type 2 diabetes mellitus without complication, without long-term current use of insulin (HCC) -     POCT glucose (manual entry) -     POCT glycosylated hemoglobin (Hb A1C) -     Microalbumin/Creatinine Ratio, Urine -     POCT urinalysis dipstick -     insulin aspart (novoLOG) injection 10 Units; Inject 0.1 mLs (10 Units total) into the skin once.  Chronic pain -     Uric acid -     Ambulatory referral to Pain Clinic -     acetaminophen-codeine (TYLENOL #3) 300-30 MG tablet; Take 1 tablet by mouth every 8 (eight) hours as needed for moderate pain.  Nipple pain -     Testosterone  Need for hepatitis C screening test -     Hepatitis C antibody, reflex  Depression -     Ambulatory referral to Psychiatry   You will be called with lab results  F/u for CBG check in 4 weeks  F/u in 3 months with me   Dr. Adrian Blackwater

## 2015-05-16 NOTE — Progress Notes (Signed)
ASSESSMENT: Pt currently experiencing primarily symptoms of depression, as well as symptoms of anxiety and chronic pain. Pt needs to f/u with PCP and [facility] and would benefit from psychoeducation and supportive counseling regarding symptoms of depression and anxiety.  Stage of Change: contemplative  PLAN: 1. F/U with behavioral health consultant in as needed 2. Psychiatric Medications: none, does not want to take until he talks to psychiatrist 3. Behavioral recommendation(s):   -Go to [facility] walk-in clinic or call [facility] number for additional options w Medicaid for [facility] med management -Consider relaxation breathing exercises daily -Consider reading educational material regarding coping with symptoms of depression and anxiety -Contact crisis line if he feels suicidal again SUBJECTIVE: Pt. referred by Dr Adrian Blackwater  for symptoms of depression:  Pt. 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 Pt lost his mother in December 02, 2010, and his father's health has been deteriorating, and does not feel he has completely mourned his mother. He is often in physical and emotional pain. He worries about his father, his family, and feels "lost", says he would like to come back to talk further. Duration of problem: increase in past four months Severity: moderate  OBJECTIVE: Orientation & Cognition: Oriented x3. Thought processes normal and appropriate to situation. Mood: low, teary. Affect: appropriate Appearance: appropriate Risk of harm to self or others: no risk of harm to self today, no risk of harm to others Substance use: no Assessments administered: PHQ9: 16/ GAD7: 10  Diagnosis: Depression CPT Code: F32.9 -------------------------------------------- Other(s) present in the room: none  Time spent with patient in exam room: 25 minutes

## 2015-05-16 NOTE — Progress Notes (Signed)
Subjective:  Patient ID: Marco [name], male    DOB: 07-26-1956  Age: 59 y.o. MRN: [id number]  CC: Diabetes  HPI Guhan Bruington presents for   1. CHRONIC DIABETES he is taking prednisone 10 mg daily   Disease Monitoring  Blood Sugar Ranges: 110-220  Polyuria: no   Visual problems: no   Medication Compliance: yes  Medication Side Effects  Hypoglycemia: no   Preventitive Health Care  Eye Exam: due   Foot Exam: done today   Exercise: minimal   2. Chronic pain: in feet, chest, hands. Previously dx with RA, however currently rheumatologist treating patient for gout. Taking allopurinol. Requesting refill for tylenol #3.   3. Depression: x 2 months. Tearful. Chronic pain. Had SI. No SI now. Amenable to psychiatry. Declines antidepressant at this time.   Social History  Substance Use Topics  . Smoking status: Never Smoker   . Smokeless tobacco: Not on file  . Alcohol Use: No   Outpatient Prescriptions Prior to Visit  Medication Sig Dispense Refill  . aspirin EC 81 MG tablet Take 1 tablet (81 mg total) by mouth daily. 150 tablet 0  . atorvastatin (LIPITOR) 40 MG tablet Take 1 tablet (40 mg total) by mouth daily. 30 tablet 3  . carvedilol (COREG) 12.5 MG tablet Take 12.5 mg by mouth 2 (two) times daily with a meal.    . diltiazem (DILACOR XR) 180 MG 24 hr capsule Take 1 capsule (180 mg total) by mouth daily. 30 capsule 3  . furosemide (LASIX) 40 MG tablet Take 1 tablet (40 mg total) by mouth daily. 30 tablet 3  . gabapentin (NEURONTIN) 300 MG capsule Take 2 capsules (600 mg total) by mouth 3 (three) times daily. 180 capsule 5  . insulin aspart protamine - aspart (NOVOLOG MIX 70/30 FLEXPEN) (70-30) 100 UNIT/ML FlexPen Inject 0.15 mLs (15 Units total) into the skin 2 (two) times daily with a meal. 15 mL 11  . Insulin Detemir (LEVEMIR FLEXPEN) 100 UNIT/ML Pen Inject 60 Units into the skin daily at 10 pm. 15 mL 11  . Insulin Pen Needle (B-D ULTRAFINE III SHORT PEN) 31G X 8 MM MISC 1  application by Does not apply route daily. 100 each 3  . lisinopril (PRINIVIL,ZESTRIL) 30 MG tablet Take 1 tablet (30 mg total) by mouth daily. 30 tablet 3  . pantoprazole (PROTONIX) 40 MG tablet Take 1 tablet (40 mg total) by mouth 2 (two) times daily. 60 tablet 4  . ranitidine (ZANTAC) 150 MG capsule Take 1 capsule (150 mg total) by mouth 2 (two) times daily. 60 capsule 3  . hyoscyamine (LEVSIN SL) 0.125 MG SL tablet Place 1 tablet (0.125 mg total) under the tongue every 6 (six) hours as needed. (Patient not taking: Reported on 04/19/2015) 90 tablet 3  . nystatin (MYCOSTATIN) 100000 UNIT/ML suspension Take 5 mLs (500,000 Units total) by mouth 4 (four) times daily. (Patient not taking: Reported on 04/19/2015) 60 mL 0  . potassium chloride SA (K-DUR,KLOR-CON) 20 MEQ tablet Take 2 tablets (40 mEq total) by mouth 2 (two) times daily. (Patient not taking: Reported on 04/19/2015) 32 tablet 0  . sildenafil (VIAGRA) 100 MG tablet Take 100 mg by mouth daily as needed for erectile dysfunction.    [name] acetaminophen-codeine (TYLENOL #3) 300-30 MG per tablet Take 1 tablet by mouth every 8 (eight) hours as needed for moderate pain. (Patient not taking: Reported on 05/16/2015) 60 tablet 0  . HYDROcodone-acetaminophen (NORCO/VICODIN) 5-325 MG per tablet Take 1-2 tablets by  mouth every 6 (six) hours as needed for moderate pain or severe pain. (Patient not taking: Reported on 05/16/2015) 30 tablet 0   No facility-administered medications prior to visit.    ROS Review of Systems  Constitutional: Negative for fever, chills, fatigue and unexpected weight change.  Eyes: Negative for visual disturbance.  Respiratory: Positive for chest tightness and shortness of breath. Negative for cough.   Cardiovascular: Negative for chest pain, palpitations and leg swelling.  Gastrointestinal: Negative for nausea, vomiting, abdominal pain, diarrhea, constipation and blood in stool.  Endocrine: Negative for polydipsia, polyphagia and  polyuria.  Musculoskeletal: Positive for back pain, joint swelling, arthralgias and gait problem. Negative for myalgias and neck pain.  Skin: Negative for rash.  Allergic/Immunologic: Negative for immunocompromised state.  Hematological: Negative for adenopathy. Does not bruise/bleed easily.  Psychiatric/Behavioral: Positive for suicidal ideas, sleep disturbance and dysphoric mood. The patient is not nervous/anxious.   GAD-7: score of 10. 2-6,7. 3-2,3.   Objective:  BP 132/82 mmHg  Pulse 76  Temp(Src) 98.5 F (36.9 C) (Oral)  Resp 16  Ht 5\' 6"  (1.676 m)  Wt 222 lb (100.699 kg)  BMI 35.85 kg/m2  SpO2 95%  BP/Weight 05/16/2015 03/11/4480 03/15/6313  Systolic BP 970 263 785  Diastolic BP 82 885 92  Wt. (Lbs) 222 222 -  BMI 35.85 35.85 -   Physical Exam  Constitutional: He appears well-developed and well-nourished. No distress.  HENT:  Head: Normocephalic and atraumatic.  Neck: Normal range of motion. Neck supple.  Cardiovascular: Normal rate, regular rhythm, normal heart sounds and intact distal pulses.   Pulmonary/Chest: Effort normal and breath sounds normal.  Musculoskeletal: He exhibits tenderness. He exhibits no edema.  Joint swelling Joint tenderness  No erythema  Neurological: He is alert.  Skin: Skin is warm and dry. No rash noted. No erythema.  Flaky skin on both feet   Psychiatric: He exhibits a depressed mood.   Lab Results  Component Value Date   HGBA1C 12.6* 12/26/2014   Lab Results  Component Value Date   HGBA1C 11.90 05/16/2015    CBG 315  Treated with 10 U of novolog   Repeat CBG 286  Depression screen [facility] 2/9 05/16/2015 03/01/2015 03/01/2015 02/20/2015 02/05/2015  Decreased Interest 2 2 2  0 0  Down, Depressed, Hopeless 3 1 1  0 0  PHQ - 2 Score 5 3 3  0 0  Altered sleeping 3 3 3  - -  Tired, decreased energy 3 3 3  - -  Change in appetite 0 2 2 - -  Feeling bad or failure about yourself  3 0 0 - -  Trouble concentrating 0 0 0 - -  Moving slowly or  fidgety/restless 0 0 0 - -  Suicidal thoughts 2 0 0 - -  PHQ-9 Score 16 11 11  - -    Assessment & Plan:   Problem List Items Addressed This Visit    Chronic pain (Chronic)   Relevant Medications   acetaminophen-codeine (TYLENOL #3) 300-30 MG tablet   Other Relevant Orders   Uric acid   Ambulatory referral to Pain Clinic   Depression   Relevant Orders   Ambulatory referral to Psychiatry   DM2 (diabetes mellitus, type 2) (HCC) - Primary (Chronic)   Relevant Medications   insulin aspart (novoLOG) injection 10 Units (Completed)   insulin aspart protamine - aspart (NOVOLOG MIX 70/30 FLEXPEN) (70-30) 100 UNIT/ML FlexPen   Other Relevant Orders   POCT glucose (manual entry) (Completed)   POCT glycosylated hemoglobin (Hb  A1C) (Completed)   Microalbumin/Creatinine Ratio, Urine   POCT urinalysis dipstick (Completed)    Other Visit Diagnoses    Nipple pain        Relevant Orders    Testosterone    Need for hepatitis C screening test        Relevant Orders    Hepatitis C antibody, reflex       No orders of the defined types were placed in this encounter.    Follow-up: No Follow-up on file.   Boykin Nearing MD

## 2015-05-17 ENCOUNTER — Telehealth: Payer: Self-pay | Admitting: Family Medicine

## 2015-05-17 LAB — MICROALBUMIN / CREATININE URINE RATIO
CREATININE, URINE: 103.1 mg/dL
MICROALB UR: 0.4 mg/dL (ref <2.0)
Microalb Creat Ratio: 3.9 mg/g (ref 0.0–30.0)

## 2015-05-17 LAB — TESTOSTERONE: TESTOSTERONE: 71 ng/dL — AB (ref 300–890)

## 2015-05-17 LAB — HEPATITIS C ANTIBODY: HCV AB: NEGATIVE

## 2015-05-17 NOTE — Telephone Encounter (Signed)
Patient called requesting to speak to the nurse about a referral. Patient stated that PCP is aware of the referral. Please f/u with pt.

## 2015-05-18 DIAGNOSIS — R7989 Other specified abnormal findings of blood chemistry: Secondary | ICD-10-CM | POA: Insufficient documentation

## 2015-05-18 NOTE — Addendum Note (Signed)
Addended by: Boykin Nearing on: 05/18/2015 09:46 AM   Modules accepted: Orders

## 2015-05-18 NOTE — Assessment & Plan Note (Signed)
Testosterone low at 71. Plan for repeat fasting 8 AM-10 AM testosterone level, if fasting T is low on 3 occassions. And PSA normal. Will initiate IM injection testosterone replacement therapy if patient agrees.

## 2015-05-19 ENCOUNTER — Other Ambulatory Visit: Payer: Self-pay | Admitting: Family Medicine

## 2015-05-23 ENCOUNTER — Telehealth: Payer: Self-pay | Admitting: *Deleted

## 2015-05-23 NOTE — Telephone Encounter (Signed)
-----   Message from Boykin Nearing, MD sent at 05/18/2015  9:38 AM EDT ----- Testosterone low at 71. Plan for repeat fasting 8 AM-10 AM testosterone level, if fasting T is low on 3 occassions. And PSA normal. Will initiate IM injection testosterone replacement therapy if patient agrees.  Screening HCV negative Uric acid normal Urine microalbumin normal

## 2015-05-23 NOTE — Telephone Encounter (Signed)
Date of birth verified by pt  Normal PSA, Uric Acid,  Miroalbumin,HCV  results given  Low Testosterone results given to pt  Pt agrees to have IM inj testosterone therapy  Transfer call to front office for fasting lab schedule

## 2015-05-24 ENCOUNTER — Other Ambulatory Visit: Payer: Medicaid Other

## 2015-05-24 NOTE — Telephone Encounter (Signed)
Noted  

## 2015-05-25 ENCOUNTER — Telehealth: Payer: Self-pay | Admitting: Family Medicine

## 2015-05-25 NOTE — Telephone Encounter (Signed)
Please call patient Refills for prednisone need to be authorized by his rheumatologist going forward.

## 2015-05-25 NOTE — Telephone Encounter (Signed)
LVM to return call.

## 2015-06-01 ENCOUNTER — Other Ambulatory Visit: Payer: Self-pay | Admitting: Family Medicine

## 2015-06-01 DIAGNOSIS — M2550 Pain in unspecified joint: Secondary | ICD-10-CM

## 2015-06-01 MED ORDER — PREDNISONE 10 MG PO TABS: 10.0000 mg | ORAL_TABLET | Freq: Every day | ORAL | Status: DC

## 2015-06-01 NOTE — Telephone Encounter (Signed)
Pt. Called stating that PCP referred him to pain management and he had an appt. Today at 11:15 and pt. Is still there waiting to be able to see the doctor. Pt. Stated he is going to cancel the appt.. Pt. Stated that pt. That came after him have already been seen. Pt. Would like to know if he can go somewhere else.  Please f/u with pt.

## 2015-06-01 NOTE — Telephone Encounter (Signed)
Prednisone filled

## 2015-06-04 ENCOUNTER — Encounter (HOSPITAL_COMMUNITY): Payer: Self-pay | Admitting: Emergency Medicine

## 2015-06-04 ENCOUNTER — Emergency Department (INDEPENDENT_AMBULATORY_CARE_PROVIDER_SITE_OTHER)
Admission: EM | Admit: 2015-06-04 | Discharge: 2015-06-04 | Disposition: A | Discharge status: Home / Self Care | Payer: Medicaid Other | Source: Home / Self Care | Attending: Family Medicine | Admitting: Family Medicine

## 2015-06-04 DIAGNOSIS — B084 Enteroviral vesicular stomatitis with exanthem: Secondary | ICD-10-CM

## 2015-06-04 DIAGNOSIS — R251 Tremor, unspecified: Secondary | ICD-10-CM

## 2015-06-04 DIAGNOSIS — M7660 Achilles tendinitis, unspecified leg: Secondary | ICD-10-CM

## 2015-06-04 NOTE — ED Notes (Signed)
Pt has red spots/rash on his hands and feet and complains of a sore throat, that is hard to swallow and is dry.  Pt also reports pain in his right achilles.

## 2015-06-04 NOTE — Discharge Instructions (Signed)
Achilles Tendinitis Achilles tendinitis is inflammation of the tough, cord-like band that attaches the lower muscles of your leg to your heel (Achilles tendon). It is usually caused by overusing the tendon and joint involved.  CAUSES Achilles tendinitis can happen because of:  A sudden increase in exercise or activity (such as running).  Doing the same exercises or activities (such as jumping) over and over.  Not warming up calf muscles before exercising.  Exercising in shoes that are worn out or not made for exercise.  Having arthritis or a bone growth on the back of the heel bone. This can rub against the tendon and hurt the tendon. SIGNS AND SYMPTOMS The most common symptoms are:  Pain in the back of the leg, just above the heel. The pain usually gets worse with exercise and better with rest.  Stiffness or soreness in the back of the leg, especially in the morning.  Swelling of the skin over the Achilles tendon.  Trouble standing on tiptoe. Sometimes, an Achilles tendon tears (ruptures). Symptoms of an Achilles tendon rupture can include:  Sudden, severe pain in the back of the leg.  Trouble putting weight on the foot or walking normally. DIAGNOSIS Achilles tendinitis will be diagnosed based on symptoms and a physical examination. An X-ray may be done to check if another condition is causing your symptoms. An MRI may be ordered if your health care provider suspects you may have completely torn your tendon, which is called an Achilles tendon rupture.  TREATMENT  Achilles tendinitis usually gets better over time. It can take weeks to months to heal completely. Treatment focuses on treating the symptoms and helping the injury heal. HOME CARE INSTRUCTIONS   Rest your Achilles tendon and avoid activities that cause pain.  Apply ice to the injured area:  Put ice in a plastic bag.  Place a towel between your skin and the bag.  Leave the ice on for 20 minutes, 2-3 times a  day  Try to avoid using the tendon (other than gentle range of motion) while the tendon is painful. Do not resume use until instructed by your health care provider. Then begin use gradually. Do not increase use to the point of pain. If pain does develop, decrease use and continue the above measures. Gradually increase activities that do not cause discomfort until you achieve normal use.  Do exercises to make your calf muscles stronger and more flexible. Your health care provider or physical therapist can recommend exercises for you to do.  Wrap your ankle with an elastic bandage or other wrap. This can help keep your tendon from moving too much. Your health care provider will show you how to wrap your ankle correctly.  Only take over-the-counter or prescription medicines for pain, discomfort, or fever as directed by your health care provider. SEEK MEDICAL CARE IF:   Your pain and swelling increase or pain is uncontrolled with medicines.  You develop new, unexplained symptoms or your symptoms get worse.  You are unable to move your toes or foot.  You develop warmth and swelling in your foot.  You have an unexplained temperature. MAKE SURE YOU:   Understand these instructions.  Will watch your condition.  Will get help right away if you are not doing well or get worse.   This information is not intended to replace advice given to you by your health care provider. Make sure you discuss any questions you have with your health care provider.   Document Released:  05/07/2005 Document Revised: 08/18/2014 Document Reviewed: 03/09/2013 Elsevier Interactive Patient Education 2016 [employer]. Viral Infections A virus is a type of germ. Viruses can cause:  Minor sore throats.  Aches and pains.  Headaches.  Runny nose.  Rashes.  Watery eyes.  Tiredness.  Coughs.  Loss of appetite.  Feeling sick to your stomach (nausea).  Throwing up (vomiting).  Watery poop  (diarrhea). HOME CARE   Only take medicines as told by your doctor.  Drink enough water and fluids to keep your pee (urine) clear or pale yellow. Sports drinks are a good choice.  Get plenty of rest and eat healthy. Soups and broths with crackers or rice are fine. GET HELP RIGHT AWAY IF:   You have a very bad headache.  You have shortness of breath.  You have chest pain or neck pain.  You have an unusual rash.  You cannot stop throwing up.  You have watery poop that does not stop.  You cannot keep fluids down.  You or your child has a temperature by mouth above 102 F (38.9 C), not controlled by medicine.  Your baby is older than 3 months with a rectal temperature of 102 F (38.9 C) or higher.  Your baby is 26 months old or younger with a rectal temperature of 100.4 F (38 C) or higher. MAKE SURE YOU:   Understand these instructions.  Will watch this condition.  Will get help right away if you are not doing well or get worse.   This information is not intended to replace advice given to you by your health care provider. Make sure you discuss any questions you have with your health care provider.   Document Released: 07/10/2008 Document Revised: 10/20/2011 Document Reviewed: 01/03/2015 Elsevier Interactive Patient Education [employer].

## 2015-06-04 NOTE — ED Provider Notes (Signed)
CSN: [id number]     Arrival date & time 06/04/15  1626 History   First MD Initiated Contact with Patient 06/04/15 1826     Chief Complaint  Patient presents with  . Rash  . Foot Pain   (Consider location/radiation/quality/duration/timing/severity/associated sxs/prior Treatment) Patient is a 59 y.o. male presenting with rash and lower extremity pain. The history is provided by the patient.  Rash Location:  Mouth, hand and foot Mouth rash location:  Upper inner lip, lower inner lip, L inner cheek and R inner cheek Hand rash location:  R hand and dorsum of L hand Foot rash location:  Top of L foot and top of R foot Quality: redness   Severity:  Moderate Onset quality:  Sudden Duration:  1 day Timing:  Constant Progression:  Spreading Chronicity:  New Relieved by:  Nothing Worsened by:  Nothing tried Ineffective treatments:  None tried Associated symptoms: hoarse voice, myalgias and sore throat   Foot Pain    Past Medical History  Diagnosis Date  . Non-obstructive CAD     a. 02/2013 Cath ([place]): nonobs dzs;  b. 09/2014 Myoview ([place]): EF 55%, no ischemia;  c. 12/2014 Echo: EF 50-55%, gr1 DD, no effusion.  . Essential hypertension   . Diabetes mellitus without complication ([place])   . Rheumatoid arthritis ([place])   . Chronic pain   . PAF (paroxysmal atrial fibrillation) ([place])     a. 02/2013 s/p rfca in [place], [place]-->prev on Xarelto, d/c'd 2/2 anemia, ? GIB.  [name] Benign colon polyp 08/01/2013    [facility] in [place]. large base tranverse colon polyp was biopsied. polyp was benign with minimal surface hyperplastic change.  . Morbid obesity ([place])   . Gastritis 07/29/2013    confirmed on EGD, bx done an negative for intestinal metaplasia, dsyplasia or H. pylori. normal gastric emptying study done 07/13/2013.  [name] Esophageal hiatal hernia 07/29/2013    confirmed on EGD   . Sigmoid diverticulosis 08/01/2013    confirmed on colonscopy. record scanned into chart   Past  Surgical History  Procedure Laterality Date  . Colon surgery  09/2014    colon resection   . Cardiac catheterization  05/2014    ablation for atrial fibrillation  . Colon resection  09/2013    due to large, abnormal polpy. non cancerous per patient.    Family History  Problem Relation Age of Onset  . Hypertension Mother   . Diabetes Mother   . Cancer Mother   . COPD Mother   . Hypertension Father   . Diabetes Father   . Heart Problems Father   . COPD Father    Social History  Substance Use Topics  . Smoking status: Never Smoker   . Smokeless tobacco: None  . Alcohol Use: No    Review of Systems  Constitutional: Negative.   HENT: Positive for hoarse voice, sore throat and voice change.   Eyes: Negative.   Respiratory: Negative.   Cardiovascular: Negative.   Endocrine: Negative.   Genitourinary: Negative.   Musculoskeletal: Positive for myalgias.  Skin: Positive for rash.  Allergic/Immunologic: Negative.   Neurological: Positive for tremors.  Hematological: Negative.   Psychiatric/Behavioral: Negative.     Allergies  Review of patient's allergies indicates no known allergies.  Home Medications   Prior to Admission medications   Medication Sig Start Date End Date Taking? Authorizing Provider  allopurinol (ZYLOPRIM) 100 MG tablet Take 100 mg by mouth daily.   Yes Historical Provider, MD  aspirin EC 81 MG tablet Take 1 tablet (81 mg total) by mouth daily. 03/30/15  Yes Tiffany Daneil Dan, [name]  atorvastatin (LIPITOR) 40 MG tablet Take 1 tablet (40 mg total) by mouth daily. 03/30/15  Yes Tiffany Daneil Dan, [name]  carvedilol (COREG) 12.5 MG tablet Take 12.5 mg by mouth 2 (two) times daily with a meal.   Yes Historical Provider, MD  cyclobenzaprine (FLEXERIL) 10 MG tablet Take 10 mg by mouth 2 (two) times daily as needed for muscle spasms.   Yes Historical Provider, MD  diltiazem (DILACOR XR) 180 MG 24 hr capsule Take 1 capsule (180 mg total) by mouth daily. 03/30/15  Yes Tiffany Daneil Dan, [name]  furosemide (LASIX) 40 MG tablet Take 1 tablet (40 mg total) by mouth daily. 03/30/15  Yes Tiffany Daneil Dan, [name]  gabapentin (NEURONTIN) 300 MG capsule Take 2 capsules (600 mg total) by mouth 3 (three) times daily. 04/13/15  Yes Josalyn Funches, MD  insulin aspart protamine - aspart (NOVOLOG MIX 70/30 FLEXPEN) (70-30) 100 UNIT/ML FlexPen Inject 0.2 mLs (20 Units total) into the skin 2 (two) times daily with a meal. 05/16/15  Yes Josalyn Funches, MD  Insulin Detemir (LEVEMIR FLEXPEN) 100 UNIT/ML Pen Inject 60 Units into the skin daily at 10 pm. 02/23/15  Yes Josalyn Funches, MD  Insulin Pen Needle (B-D ULTRAFINE III SHORT PEN) 31G X 8 MM MISC 1 application by Does not apply route daily. 02/23/15  Yes Josalyn Funches, MD  lisinopril (PRINIVIL,ZESTRIL) 30 MG tablet Take 1 tablet (30 mg total) by mouth daily. 03/30/15  Yes Tiffany Daneil Dan, [name]  pantoprazole (PROTONIX) 40 MG tablet Take 1 tablet (40 mg total) by mouth 2 (two) times daily. 03/16/15  Yes Lori P Hvozdovic, [name]  predniSONE (DELTASONE) 10 MG tablet Take 1 tablet (10 mg total) by mouth daily with breakfast. 06/01/15  Yes Josalyn Funches, MD  ranitidine (ZANTAC) 150 MG capsule Take 1 capsule (150 mg total) by mouth 2 (two) times daily. 03/16/15  Yes Lori P Hvozdovic, [name]  acetaminophen-codeine (TYLENOL #3) 300-30 MG tablet Take 1 tablet by mouth every 8 (eight) hours as needed for moderate pain. 05/16/15   Josalyn Funches, MD  hyoscyamine (LEVSIN SL) 0.125 MG SL tablet Place 1 tablet (0.125 mg total) under the tongue every 6 (six) hours as needed. Patient not taking: Reported on 04/19/2015 03/16/15   Cecille Rubin P Hvozdovic, [name]  nystatin (MYCOSTATIN) 100000 UNIT/ML suspension Take 5 mLs (500,000 Units total) by mouth 4 (four) times daily. Patient not taking: Reported on 04/19/2015 03/19/15   Boykin Nearing, MD  potassium chloride SA (K-DUR,KLOR-CON) 20 MEQ tablet Take 2 tablets (40 mEq total) by mouth 2 (two) times daily. Patient not taking: Reported on  04/19/2015 04/15/15 04/23/15  Merrily Pew, MD  sildenafil (VIAGRA) 100 MG tablet Take 100 mg by mouth daily as needed for erectile dysfunction.    Historical Provider, MD   Meds Ordered and Administered this Visit  Medications - No data to display  BP 130/79 mmHg  Pulse 74  Temp(Src) 98.2 F (36.8 C) (Oral)  Resp 16  SpO2 95% No data found.   Physical Exam  Constitutional: He is oriented to person, place, and time.  Chronically ill appearing AA male in NAD  HENT:  Head: Normocephalic and atraumatic.  OPX - erythematous w/o exudate  Eyes: Conjunctivae and EOM are normal. Pupils are equal, round, and reactive to light.  Neck: Normal range of motion. Neck supple.  Cardiovascular: Normal rate, regular rhythm and  normal heart sounds.   Pulmonary/Chest: Effort normal and breath sounds normal.  Abdominal: Soft. Bowel sounds are normal.  Musculoskeletal: He exhibits tenderness.  Right achilles tendon tender.  Negative Thompson Test  Neurological: He is alert and oriented to person, place, and time.  Hands with tremor at rest and stop when picks up object bilateral hands.  Skin: Skin is warm and dry.    ED Course  Procedures (including critical care time)  Labs Review Labs Reviewed - No data to display  Imaging Review No results found.   Visual Acuity Review  Right Eye Distance:   Left Eye Distance:   Bilateral Distance:    Right Eye Near:   Left Eye Near:    Bilateral Near:         MDM  Hand Foot Mouth Disease - Explained is viral illness and will resolve in 2-3 days, recommend not to be in public and avoid family if at all possible since it is communicable.  Discussed hand washing. Tylenol otc as directed.  Tremors - Probably essential but would defer to Primary Care for work up and follow up.  Explained this may also be because of viral illness.  He may need referral to Neurology if this continues. Follow up with PCP.  Achilles Tendonitis - Unable to take NSAIDS  so advised to stay off his feet and rest and if not better follow up with PCP.  [place], FNP 06/04/15 [phone]

## 2015-06-05 ENCOUNTER — Emergency Department (HOSPITAL_COMMUNITY)
Admission: EM | Admit: 2015-06-05 | Discharge: 2015-06-05 | Disposition: A | Discharge status: Home / Self Care | Payer: Medicaid Other | Attending: Emergency Medicine | Admitting: Emergency Medicine

## 2015-06-05 ENCOUNTER — Encounter (HOSPITAL_COMMUNITY): Payer: Self-pay | Admitting: Emergency Medicine

## 2015-06-05 DIAGNOSIS — R682 Dry mouth, unspecified: Secondary | ICD-10-CM | POA: Diagnosis present

## 2015-06-05 DIAGNOSIS — M069 Rheumatoid arthritis, unspecified: Secondary | ICD-10-CM | POA: Diagnosis not present

## 2015-06-05 DIAGNOSIS — I2510 Atherosclerotic heart disease of native coronary artery without angina pectoris: Secondary | ICD-10-CM | POA: Diagnosis not present

## 2015-06-05 DIAGNOSIS — R110 Nausea: Secondary | ICD-10-CM | POA: Insufficient documentation

## 2015-06-05 DIAGNOSIS — R300 Dysuria: Secondary | ICD-10-CM | POA: Insufficient documentation

## 2015-06-05 DIAGNOSIS — Z86010 Personal history of colonic polyps: Secondary | ICD-10-CM | POA: Insufficient documentation

## 2015-06-05 DIAGNOSIS — Z794 Long term (current) use of insulin: Secondary | ICD-10-CM | POA: Insufficient documentation

## 2015-06-05 DIAGNOSIS — I10 Essential (primary) hypertension: Secondary | ICD-10-CM | POA: Diagnosis not present

## 2015-06-05 DIAGNOSIS — Z79899 Other long term (current) drug therapy: Secondary | ICD-10-CM | POA: Diagnosis not present

## 2015-06-05 DIAGNOSIS — Z7982 Long term (current) use of aspirin: Secondary | ICD-10-CM | POA: Insufficient documentation

## 2015-06-05 DIAGNOSIS — G8929 Other chronic pain: Secondary | ICD-10-CM | POA: Diagnosis not present

## 2015-06-05 DIAGNOSIS — Z9889 Other specified postprocedural states: Secondary | ICD-10-CM | POA: Diagnosis not present

## 2015-06-05 DIAGNOSIS — B084 Enteroviral vesicular stomatitis with exanthem: Secondary | ICD-10-CM | POA: Insufficient documentation

## 2015-06-05 DIAGNOSIS — K2970 Gastritis, unspecified, without bleeding: Secondary | ICD-10-CM | POA: Diagnosis not present

## 2015-06-05 DIAGNOSIS — Z7952 Long term (current) use of systemic steroids: Secondary | ICD-10-CM | POA: Insufficient documentation

## 2015-06-05 DIAGNOSIS — E119 Type 2 diabetes mellitus without complications: Secondary | ICD-10-CM | POA: Diagnosis not present

## 2015-06-05 NOTE — Discharge Instructions (Signed)
Please continue using warm saltwater gallbladder versus, Tylenol or ibuprofen as needed for pain, monitoring for new or worsening signs or symptoms. Please follow-up to primary care in 3-5 days for reevaluation further assessment, follow-up sooner as needed.

## 2015-06-05 NOTE — ED Provider Notes (Signed)
CSN: [id number]     Arrival date & time 06/05/15  1914 History  By signing my name below, I, Marco [name], attest that this documentation has been prepared under the direction and in the presence of Marco Sink, [name] Electronically Signed: Soijett [name], ED Scribe. 06/05/2015. 9:37 PM.   No chief complaint on file.    The history is provided by the patient. No language interpreter was used.    HPI Comments: Marco [name] is a 59 y.o. male who presents to the Emergency Department complaining of dry mouth onset 3 days. He notes that he was seen in the urgent care for his symptoms and a rash. He was informed that he had hand foot mouth dx. He reports that he has been around young kids who are not symptomatic at this time. He reports that his lips feel like they are sticking to one another and that when he will begin to talk his mouth will get dry. He was informed during his visit at urgent care that there is no treatment and that he should drink fluids. He states that he is having associated symptoms of dysuria and nausea. He states that he has tried drinking fluids and warm salt water rinses with no relief for his symptoms. He denies fever, vomiting, and any other symptoms.   Past Medical History  Diagnosis Date  . Non-obstructive CAD     a. 02/2013 Cath ([place]): nonobs dzs;  b. 09/2014 Myoview ([place]): EF 55%, no ischemia;  c. 12/2014 Echo: EF 50-55%, gr1 DD, no effusion.  . Essential hypertension   . Diabetes mellitus without complication ([place])   . Rheumatoid arthritis ([place])   . Chronic pain   . PAF (paroxysmal atrial fibrillation) ([place])     a. 02/2013 s/p rfca in [place], [place]-->prev on Xarelto, d/c'd 2/2 anemia, ? GIB.  [name] Benign colon polyp 08/01/2013    [facility] in [place]. large base tranverse colon polyp was biopsied. polyp was benign with minimal surface hyperplastic change.  . Morbid obesity ([place])   . Gastritis 07/29/2013    confirmed on EGD, bx done an negative for  intestinal metaplasia, dsyplasia or H. pylori. normal gastric emptying study done 07/13/2013.  [name] Esophageal hiatal hernia 07/29/2013    confirmed on EGD   . Sigmoid diverticulosis 08/01/2013    confirmed on colonscopy. record scanned into chart   Past Surgical History  Procedure Laterality Date  . Colon surgery  09/2014    colon resection   . Cardiac catheterization  05/2014    ablation for atrial fibrillation  . Colon resection  09/2013    due to large, abnormal polpy. non cancerous per patient.    Family History  Problem Relation Age of Onset  . Hypertension Mother   . Diabetes Mother   . Cancer Mother   . COPD Mother   . Hypertension Father   . Diabetes Father   . Heart Problems Father   . COPD Father    Social History  Substance Use Topics  . Smoking status: Never Smoker   . Smokeless tobacco: None  . Alcohol Use: No    Review of Systems  All other systems reviewed and are negative.     Allergies  Review of patient's allergies indicates no known allergies.  Home Medications   Prior to Admission medications   Medication Sig Start Date End Date Taking? Authorizing Provider  acetaminophen-codeine (TYLENOL #3) 300-30 MG tablet Take 1 tablet by mouth every 8 (eight) hours  as needed for moderate pain. 05/16/15   Josalyn Funches, MD  allopurinol (ZYLOPRIM) 100 MG tablet Take 100 mg by mouth daily.    Historical Provider, MD  aspirin EC 81 MG tablet Take 1 tablet (81 mg total) by mouth daily. 03/30/15   Tiffany Daneil Dan, [name]  atorvastatin (LIPITOR) 40 MG tablet Take 1 tablet (40 mg total) by mouth daily. 03/30/15   Tiffany Daneil Dan, [name]  carvedilol (COREG) 12.5 MG tablet Take 12.5 mg by mouth 2 (two) times daily with a meal.    Historical Provider, MD  cyclobenzaprine (FLEXERIL) 10 MG tablet Take 10 mg by mouth 2 (two) times daily as needed for muscle spasms.    Historical Provider, MD  diltiazem (DILACOR XR) 180 MG 24 hr capsule Take 1 capsule (180 mg total) by mouth daily.  03/30/15   Tiffany Daneil Dan, [name]  furosemide (LASIX) 40 MG tablet Take 1 tablet (40 mg total) by mouth daily. 03/30/15   Brayton Caves, [name]  gabapentin (NEURONTIN) 300 MG capsule Take 2 capsules (600 mg total) by mouth 3 (three) times daily. 04/13/15   Josalyn Funches, MD  hyoscyamine (LEVSIN SL) 0.125 MG SL tablet Place 1 tablet (0.125 mg total) under the tongue every 6 (six) hours as needed. Patient not taking: Reported on 04/19/2015 03/16/15   Lori P Hvozdovic, [name]  insulin aspart protamine - aspart (NOVOLOG MIX 70/30 FLEXPEN) (70-30) 100 UNIT/ML FlexPen Inject 0.2 mLs (20 Units total) into the skin 2 (two) times daily with a meal. 05/16/15   Josalyn Funches, MD  Insulin Detemir (LEVEMIR FLEXPEN) 100 UNIT/ML Pen Inject 60 Units into the skin daily at 10 pm. 02/23/15   Josalyn Funches, MD  Insulin Pen Needle (B-D ULTRAFINE III SHORT PEN) 31G X 8 MM MISC 1 application by Does not apply route daily. 02/23/15   Josalyn Funches, MD  lisinopril (PRINIVIL,ZESTRIL) 30 MG tablet Take 1 tablet (30 mg total) by mouth daily. 03/30/15   Tiffany Daneil Dan, [name]  nystatin (MYCOSTATIN) 100000 UNIT/ML suspension Take 5 mLs (500,000 Units total) by mouth 4 (four) times daily. Patient not taking: Reported on 04/19/2015 03/19/15   Boykin Nearing, MD  pantoprazole (PROTONIX) 40 MG tablet Take 1 tablet (40 mg total) by mouth 2 (two) times daily. 03/16/15   Lori P Hvozdovic, [name]  potassium chloride SA (K-DUR,KLOR-CON) 20 MEQ tablet Take 2 tablets (40 mEq total) by mouth 2 (two) times daily. Patient not taking: Reported on 04/19/2015 04/15/15 04/23/15  Merrily Pew, MD  predniSONE (DELTASONE) 10 MG tablet Take 1 tablet (10 mg total) by mouth daily with breakfast. 06/01/15   Boykin Nearing, MD  ranitidine (ZANTAC) 150 MG capsule Take 1 capsule (150 mg total) by mouth 2 (two) times daily. 03/16/15   Lori P Hvozdovic, [name]  sildenafil (VIAGRA) 100 MG tablet Take 100 mg by mouth daily as needed for erectile dysfunction.    Historical Provider, MD    BP 124/76 mmHg  Pulse 84  Temp(Src) 98.4 F (36.9 C) (Oral)  Resp 16  SpO2 99% Physical Exam  Constitutional: He is oriented to person, place, and time. He appears well-developed and well-nourished. No distress.  HENT:  Head: Normocephalic and atraumatic.  Mouth/Throat: Uvula is midline, oropharynx is clear and moist and mucous membranes are normal. No uvula swelling.  No postpharyngeal swelling or lesions to posterior pharynx. ruptured vesicles to the lips and gums approximately 5 noted. No swelling of tongue.  Eyes: EOM are normal.  Neck: Neck supple.  Cardiovascular: Normal  rate.   Pulmonary/Chest: Effort normal. No respiratory distress.  Abdominal: Soft. There is no tenderness.  Musculoskeletal: Normal range of motion.  Neurological: He is alert and oriented to person, place, and time.  Skin: Skin is warm and dry. Lesion and rash noted. Rash is macular.  Macular skin lesions to the hands and soles of feet. Dry skin noted to the palms.  Psychiatric: He has a normal mood and affect. His behavior is normal.  Nursing note and vitals reviewed.   ED Course  Procedures (including critical care time) DIAGNOSTIC STUDIES: Oxygen Saturation is 98% on RA, nl by my interpretation.    COORDINATION OF CARE: 9:01 PM Discussed treatment plan with pt at bedside which includes use tylenol/ibuprofen PRN for pain and pt agreed to plan.    Labs Review Labs Reviewed - No data to display  Imaging Review No results found.   EKG Interpretation None      MDM   Final diagnoses:  Hand, foot and mouth disease    Labs:   Imaging:   Consults:   Therapeutics:   Discharge Meds:   Assessment/Plan: Pt presentation most consisted with hand foot mouth dx, no swelling of oropharynx. Symptoms expected with this viral process. Pt is afebrile, vomiting, or any other concerning findings that would need further evaluation in the ED. Pot will be instructed to continue warm saltwater gargles,  ibuprofen and tylenol PRN for comfort. Pt will need to f/u with PCP in 3-5 days or sooner if there is discomfort. Pt verbalized understanding and agreement to today's plan and had no further questions or concerns at this time.     I personally performed the services described in this documentation, which was scribed in my presence. The recorded information has been reviewed and is accurate.   Okey Regal, [name] 06/05/15 [id number]  Harvel Quale, MD 06/06/15 [phone]

## 2015-06-05 NOTE — ED Notes (Signed)
Pt. reports dry mouth , hand and feet pain onset this week , respirations unlabored / denies fever .

## 2015-06-12 ENCOUNTER — Other Ambulatory Visit: Payer: Self-pay | Admitting: *Deleted

## 2015-06-12 MED ORDER — GLUCOSE BLOOD VI STRP: ORAL_STRIP | Status: DC

## 2015-06-14 ENCOUNTER — Ambulatory Visit (HOSPITAL_BASED_OUTPATIENT_CLINIC_OR_DEPARTMENT_OTHER): Payer: Medicaid Other

## 2015-06-14 ENCOUNTER — Encounter: Payer: Self-pay | Admitting: Family Medicine

## 2015-06-14 ENCOUNTER — Other Ambulatory Visit: Payer: Self-pay | Admitting: *Deleted

## 2015-06-14 ENCOUNTER — Ambulatory Visit: Payer: Medicaid Other | Attending: Family Medicine | Admitting: Family Medicine

## 2015-06-14 VITALS — Ht 66.0 in | Wt 223.0 lb

## 2015-06-14 VITALS — BP 127/80 | HR 71 | Temp 99.2°F | Resp 16 | Ht 66.0 in | Wt 223.0 lb

## 2015-06-14 DIAGNOSIS — G4700 Insomnia, unspecified: Secondary | ICD-10-CM | POA: Diagnosis not present

## 2015-06-14 DIAGNOSIS — R0683 Snoring: Secondary | ICD-10-CM | POA: Diagnosis not present

## 2015-06-14 DIAGNOSIS — G4731 Primary central sleep apnea: Secondary | ICD-10-CM | POA: Diagnosis not present

## 2015-06-14 DIAGNOSIS — G4730 Sleep apnea, unspecified: Secondary | ICD-10-CM

## 2015-06-14 DIAGNOSIS — G8929 Other chronic pain: Secondary | ICD-10-CM

## 2015-06-14 DIAGNOSIS — G4733 Obstructive sleep apnea (adult) (pediatric): Secondary | ICD-10-CM | POA: Diagnosis not present

## 2015-06-14 DIAGNOSIS — E119 Type 2 diabetes mellitus without complications: Secondary | ICD-10-CM | POA: Diagnosis not present

## 2015-06-14 LAB — GLUCOSE, POCT (MANUAL RESULT ENTRY): POC GLUCOSE: 283 mg/dL — AB (ref 70–99)

## 2015-06-14 MED ORDER — ACCU-CHEK AVIVA PLUS W/DEVICE KIT: PACK | Status: DC

## 2015-06-14 MED ORDER — GLUCOSE BLOOD VI STRP: ORAL_STRIP | Status: DC

## 2015-06-14 MED ORDER — ACCU-CHEK AVIVA PLUS W/DEVICE KIT: 1.0000 | PACK | Freq: Three times a day (TID) | Status: DC

## 2015-06-14 MED ORDER — GLUCOSE BLOOD VI STRP: 1.0000 | ORAL_STRIP | Freq: Three times a day (TID) | Status: DC

## 2015-06-14 MED ORDER — LORAZEPAM 0.5 MG PO TABS: 0.5000 mg | ORAL_TABLET | Freq: Two times a day (BID) | ORAL | Status: DC | PRN

## 2015-06-14 NOTE — Progress Notes (Signed)
HFU foot mouth Dx No problems Spot clear now  No Hx tobacco  Elevated glucose Stated had Insulin 70/30 20 unit at 12 Had french toast and OJ at 11:30

## 2015-06-14 NOTE — Patient Instructions (Addendum)
Marco [name] was seen today for hospitalization follow-up.  Diagnoses and all orders for this visit:  Type 2 diabetes mellitus without complication, without long-term current use of insulin (HCC) -     POCT glucose (manual entry) -     Blood Glucose Monitoring Suppl (ACCU-CHEK AVIVA PLUS) W/DEVICE KIT; 1 Device by Does not apply route 3 (three) times daily after meals. E11.9 -     glucose blood (ACCU-CHEK AVIVA PLUS) test strip; 1 each by Other route 3 (three) times daily. E11.9  Insomnia -     LORazepam (ATIVAN) 0.5 MG tablet; Take 1 tablet (0.5 mg total) by mouth 2 (two) times daily as needed for sleep. For sleep study   Diabetes blood sugar goals  Fasting (in AM before breakfast, 8 hrs of no eating or drinking (except water or unsweetened coffee or tea): 90-110 2 hrs after meals: < 160,   No low sugars: nothing < 70    F/u in 4 weeks with blood sugar log/diabetes   Dr. Adrian Blackwater

## 2015-06-14 NOTE — Assessment & Plan Note (Signed)
Check post prandial sugars Adjust insulin for there

## 2015-06-14 NOTE — Progress Notes (Signed)
Patient ID: Marco [name], male   DOB: 04-18-1956, 59 y.o.   MRN: [id number]   Subjective:  Patient ID: Marco [name], male    DOB: 12-07-1955  Age: 60 y.o. MRN: [id number]  CC: Hospitalization Follow-up   HPI Marco [name] presents for    1. ED f/u hand foot and mouth: no oral lesions now. No fever.   2. CHRONIC DIABETES  Disease Monitoring  Blood Sugar Ranges: 90-130 fasting, not check post prandial sugars   Polyuria: no   Visual problems: no   Medication Compliance: yes  Medication Side Effects  Hypoglycemia: no   3. Sleep study: scheduled for tonight. Unsure if he will be able to sleep. Referred for excessive somnolence.    Social History  Substance Use Topics  . Smoking status: Never Smoker   . Smokeless tobacco: Not on file  . Alcohol Use: No   Outpatient Prescriptions Prior to Visit  Medication Sig Dispense Refill  . acetaminophen-codeine (TYLENOL #3) 300-30 MG tablet Take 1 tablet by mouth every 8 (eight) hours as needed for moderate pain. 60 tablet 2  . allopurinol (ZYLOPRIM) 100 MG tablet Take 100 mg by mouth daily.    [name] aspirin EC 81 MG tablet Take 1 tablet (81 mg total) by mouth daily. 150 tablet 0  . atorvastatin (LIPITOR) 40 MG tablet Take 1 tablet (40 mg total) by mouth daily. 30 tablet 3  . carvedilol (COREG) 12.5 MG tablet Take 12.5 mg by mouth 2 (two) times daily with a meal.    . cyclobenzaprine (FLEXERIL) 10 MG tablet Take 10 mg by mouth 2 (two) times daily as needed for muscle spasms.    [name] diltiazem (DILACOR XR) 180 MG 24 hr capsule Take 1 capsule (180 mg total) by mouth daily. 30 capsule 3  . furosemide (LASIX) 40 MG tablet Take 1 tablet (40 mg total) by mouth daily. 30 tablet 3  . gabapentin (NEURONTIN) 300 MG capsule Take 2 capsules (600 mg total) by mouth 3 (three) times daily. 180 capsule 5  . glucose blood test strip Use as instructed 100 each 12  . hyoscyamine (LEVSIN SL) 0.125 MG SL tablet Place 1 tablet (0.125 mg total) under the tongue every 6  (six) hours as needed. 90 tablet 3  . insulin aspart protamine - aspart (NOVOLOG MIX 70/30 FLEXPEN) (70-30) 100 UNIT/ML FlexPen Inject 0.2 mLs (20 Units total) into the skin 2 (two) times daily with a meal. 15 mL 11  . Insulin Detemir (LEVEMIR FLEXPEN) 100 UNIT/ML Pen Inject 60 Units into the skin daily at 10 pm. 15 mL 11  . Insulin Pen Needle (B-D ULTRAFINE III SHORT PEN) 31G X 8 MM MISC 1 application by Does not apply route daily. 100 each 3  . lisinopril (PRINIVIL,ZESTRIL) 30 MG tablet Take 1 tablet (30 mg total) by mouth daily. 30 tablet 3  . nystatin (MYCOSTATIN) 100000 UNIT/ML suspension Take 5 mLs (500,000 Units total) by mouth 4 (four) times daily. 60 mL 0  . pantoprazole (PROTONIX) 40 MG tablet Take 1 tablet (40 mg total) by mouth 2 (two) times daily. 60 tablet 4  . predniSONE (DELTASONE) 10 MG tablet Take 1 tablet (10 mg total) by mouth daily with breakfast. 30 tablet 1  . ranitidine (ZANTAC) 150 MG capsule Take 1 capsule (150 mg total) by mouth 2 (two) times daily. 60 capsule 3  . sildenafil (VIAGRA) 100 MG tablet Take 100 mg by mouth daily as needed for erectile dysfunction.    . potassium chloride SA (  K-DUR,KLOR-CON) 20 MEQ tablet Take 2 tablets (40 mEq total) by mouth 2 (two) times daily. (Patient not taking: Reported on 04/19/2015) 32 tablet 0   No facility-administered medications prior to visit.    ROS Review of Systems  Constitutional: Negative for fever, chills, fatigue and unexpected weight change.  Eyes: Negative for visual disturbance.  Respiratory: Negative for cough and shortness of breath.   Cardiovascular: Negative for chest pain, palpitations and leg swelling.  Gastrointestinal: Negative for nausea, vomiting, abdominal pain, diarrhea, constipation and blood in stool.  Endocrine: Negative for polydipsia, polyphagia and polyuria.  Musculoskeletal: Negative for myalgias, back pain, arthralgias, gait problem and neck pain.  Skin: Negative for rash.  Allergic/Immunologic:  Negative for immunocompromised state.  Hematological: Negative for adenopathy. Does not bruise/bleed easily.  Psychiatric/Behavioral: Negative for suicidal ideas, sleep disturbance and dysphoric mood. The patient is not nervous/anxious.     Objective:  BP 127/80 mmHg  Pulse 71  Temp(Src) 99.2 F (37.3 C) (Oral)  Resp 16  Ht _0  (1.676 m)  Wt 223 lb (101.152 kg)  BMI 36.01 kg/m2  SpO2 93%  BP/Weight 06/14/2015 06/05/2015 93/90/3009  Systolic BP 233 007 622  Diastolic BP 80 76 79  Wt. (Lbs) 223 - -  BMI 36.01 - -    Physical Exam  Constitutional: He appears well-developed and well-nourished. No distress.  HENT:  Head: Normocephalic and atraumatic.  Neck: Normal range of motion. Neck supple.  Cardiovascular: Normal rate, regular rhythm, normal heart sounds and intact distal pulses.   Pulmonary/Chest: Effort normal and breath sounds normal.  Musculoskeletal: He exhibits no edema.  Neurological: He is alert.  Skin: Skin is warm and dry. No rash noted. No erythema.  Psychiatric: He has a normal mood and affect.    Lab Results  Component Value Date   HGBA1C 11.90 05/16/2015   CBG 283  Assessment & Plan:   Problem List Items Addressed This Visit    DM2 (diabetes mellitus, type 2) ([place]) - Primary (Chronic)   Relevant Medications   Blood Glucose Monitoring Suppl (ACCU-CHEK AVIVA PLUS) W/DEVICE KIT   glucose blood (ACCU-CHEK AVIVA PLUS) test strip   Other Relevant Orders   POCT glucose (manual entry) (Completed)    Other Visit Diagnoses    Insomnia        Relevant Medications    LORazepam (ATIVAN) 0.5 MG tablet       No orders of the defined types were placed in this encounter.    Follow-up: No Follow-up on file.   Boykin Nearing MD

## 2015-06-18 ENCOUNTER — Telehealth: Payer: Self-pay | Admitting: Family Medicine

## 2015-06-18 DIAGNOSIS — G8929 Other chronic pain: Secondary | ICD-10-CM

## 2015-06-18 NOTE — Telephone Encounter (Signed)
Patient called and requested Lancets to check his blood sugar. Please f/u

## 2015-06-19 MED ORDER — ACCU-CHEK FASTCLIX LANCETS MISC: 1.0000 | Freq: Three times a day (TID) | Status: DC

## 2015-06-19 NOTE — Telephone Encounter (Signed)
Rx refill send to [facility]  Pt notified

## 2015-06-20 ENCOUNTER — Ambulatory Visit: Payer: Medicaid Other | Admitting: Internal Medicine

## 2015-06-30 DIAGNOSIS — G4730 Sleep apnea, unspecified: Secondary | ICD-10-CM | POA: Diagnosis not present

## 2015-06-30 NOTE — Progress Notes (Signed)
Patient Name: Marco [name], Marco [name] Study Date: 06/14/2015 Gender: Male D.O.B: 10/26/1955 Age (years): 59 Referring Provider: Charles Plummer Height (inches): 66 Interpreting Physician: Clinton Young MD, ABSM Weight (lbs): 223 RPSGT: Dubili, Fred BMI: 36 MRN: [id number] Neck Size: 19.00 CLINICAL INFORMATION The patient is referred for a split night study with BiPAP required.  MEDICATIONS Medications taken by the patient : charted for review Medications administered by patient during sleep study : No sleep medicine administered.  SLEEP STUDY TECHNIQUE As per the AASM Manual for the Scoring of Sleep and Associated Events v2.3 (April 2016) with a hypopnea requiring 4% desaturations. The channels recorded and monitored were frontal, central and occipital EEG, electrooculogram (EOG), submentalis EMG (chin), nasal and oral airflow, thoracic and abdominal wall motion, anterior tibialis EMG, snore microphone, electrocardiogram, and pulse oximetry. Bi-level positive airway pressure (BiPAP) was initiated when the patient met split night criteria and was titrated according to treat sleep-disordered breathing.  RESPIRATORY PARAMETERS Diagnostic Total AHI (/hr): 37.8 RDI (/hr):   OA Index (/hr): 18.7 CA Index (/hr): 0.0 REM AHI (/hr): 80.0 NREM AHI (/hr): 28.7 Supine AHI (/hr):   Non-supine AHI (/hr):   Min O2 Sat (%): 76.0 Mean O2 (%): 91.7 Time below 88% (min): 16.9     Titration Optimal IPAP Pressure (cm): 20 Optimal EPAP Pressure (cm): 16 AHI at Optimal Pressure (/hr): 0.0 Min O2 at Optimal Pressure (%): 90.0 Sleep % at Optimal (%): 100 Supine % at Optimal (%): 0          SLEEP ARCHITECTURE The study was initiated at 9:54:42 PM and terminated at 4:30:40 AM. The total recorded time was 396.0 minutes. EEG confirmed total sleep time was 381.1 minutes yielding a sleep efficiency of 96.3%. Sleep onset after lights out was 0.3 minutes with a REM latency of 65.0 minutes. The patient spent 2.23% of  the night in stage N1 sleep, 79.01% in stage N2 sleep, 4.72% in stage N3 and 14.04% in REM. Wake after sleep onset (WASO) was 14.5 minutes. The Arousal Index was 2.0/hour.  LEG MOVEMENT DATA The total Periodic Limb Movements of Sleep (PLMS) were 33. The PLMS index was 5.19 .  CARDIAC DATA The 2 lead EKG demonstrated sinus rhythm. The mean heart rate was 66.82 beats per minute. Other EKG findings include: None.  IMPRESSIONS - Severe obstructive sleep apnea/ hypopnea syndrome, AHI 37.8/ hr with oxygen desaturation to 76.0% - With CPAP titration, the patient developed central apneas, and was changed to BiLevel/ BIPAP with final titration to IPAP 20/ EPAP 16 - Central apneas appeared during CPAP titration, representing Complex Sleep Apnea - The patient snored with Soft snoring volume during the diagnostic portion of the study. - No cardiac abnormalities were noted during this study.  DIAGNOSIS - Obstructive Sleep Apnea (327.23 [G47.33 ICD-10]) - Central Sleep Apnea (327.27 [G47.37 ICD-10])  RECOMMENDATIONS - Trial of BiPAP therapy on 20/16 cm H2O with a Medium size Fisher&Paykel Full Face Mask Simplus mask and heated humidification. - If BIPAP is not tolerated, consider changing to CPAP 14 and accepting central events as long as obstructive apnea is controlled. - Avoid alcohol, sedatives and other CNS depressants that may worsen sleep apnea and disrupt normal sleep architecture. - Sleep hygiene should be reviewed to assess factors that may improve sleep quality. - Weight management and regular exercise should be initiated or continued.  YOUNG,CLINTON D Diplomate, American Board of Sleep Medicine  ELECTRONICALLY SIGNED ON:  06/30/2015, 10:10 AM [facility] PH: [phone]   FX: [phone]  [phone] ACCREDITED BY THE AMERICAN ACADEMY OF SLEEP MEDICINE  

## 2015-07-10 ENCOUNTER — Other Ambulatory Visit: Payer: Self-pay | Admitting: *Deleted

## 2015-07-10 MED ORDER — ACCU-CHEK SOFTCLIX LANCET DEV MISC: Status: DC

## 2015-07-12 ENCOUNTER — Encounter: Payer: Self-pay | Admitting: Family Medicine

## 2015-07-12 ENCOUNTER — Ambulatory Visit: Payer: Medicaid Other | Attending: Family Medicine | Admitting: Family Medicine

## 2015-07-12 VITALS — BP 119/80 | HR 80 | Temp 98.0°F | Resp 16 | Ht 66.0 in | Wt 221.0 lb

## 2015-07-12 DIAGNOSIS — Z79899 Other long term (current) drug therapy: Secondary | ICD-10-CM | POA: Diagnosis not present

## 2015-07-12 DIAGNOSIS — E119 Type 2 diabetes mellitus without complications: Secondary | ICD-10-CM

## 2015-07-12 DIAGNOSIS — R682 Dry mouth, unspecified: Secondary | ICD-10-CM | POA: Diagnosis not present

## 2015-07-12 DIAGNOSIS — Z794 Long term (current) use of insulin: Secondary | ICD-10-CM | POA: Diagnosis not present

## 2015-07-12 DIAGNOSIS — G4733 Obstructive sleep apnea (adult) (pediatric): Secondary | ICD-10-CM | POA: Insufficient documentation

## 2015-07-12 DIAGNOSIS — Z7982 Long term (current) use of aspirin: Secondary | ICD-10-CM | POA: Insufficient documentation

## 2015-07-12 DIAGNOSIS — B370 Candidal stomatitis: Secondary | ICD-10-CM | POA: Diagnosis not present

## 2015-07-12 LAB — GLUCOSE, POCT (MANUAL RESULT ENTRY)
POC Glucose: 346 mg/dl — AB (ref 70–99)
POC Glucose: 350 mg/dl — AB (ref 70–99)

## 2015-07-12 MED ORDER — CLOTRIMAZOLE 10 MG MT TROC: 10.0000 mg | Freq: Every day | OROMUCOSAL | Status: DC

## 2015-07-12 MED ORDER — INSULIN ASPART 100 UNIT/ML CARTRIDGE (PENFILL)
5.0000 [IU] | Freq: Once | SUBCUTANEOUS | Status: AC
  Administered 2015-07-12: 5 [IU] via SUBCUTANEOUS

## 2015-07-12 MED ORDER — INSULIN ASPART PROT & ASPART (70-30 MIX) 100 UNIT/ML PEN: 25.0000 [IU] | PEN_INJECTOR | Freq: Two times a day (BID) | SUBCUTANEOUS | Status: DC

## 2015-07-12 NOTE — Assessment & Plan Note (Signed)
BiPAP ordered and faxed to [facility]

## 2015-07-12 NOTE — Progress Notes (Signed)
F/U DM Glucose running at home- 160-220 fasting  Taking medication as prescribed  No tobacco user  No pain today  No suicide thought in the past two weeks

## 2015-07-12 NOTE — Patient Instructions (Addendum)
Marco [name] was seen today for diabetes.  Diagnoses and all orders for this visit:  Type 2 diabetes mellitus without complication, without long-term current use of insulin (HCC) -     Glucose (CBG) -     Ambulatory referral to Endocrinology -     insulin aspart protamine - aspart (NOVOLOG MIX 70/30 FLEXPEN) (70-30) 100 UNIT/ML FlexPen; Inject 0.25 mLs (25 Units total) into the skin 2 (two) times daily with a meal. -     Ambulatory referral to Ophthalmology -     Ambulatory referral to Podiatry  OSA (obstructive sleep apnea) -     For home use only DME Bipap  Oral thrush -     clotrimazole (MYCELEX) 10 MG troche; Take 1 tablet (10 mg total) by mouth 5 (five) times daily.   F/u in 6 weeks for diabetes    Dr. Adrian Blackwater

## 2015-07-12 NOTE — Progress Notes (Signed)
Subjective:  Patient ID: Marco [name], male    DOB: Dec 19, 1955  Age: 59 y.o. MRN: [id number]  CC: Diabetes  HPI Marco [name] presents for   1. CHRONIC DIABETES  Disease Monitoring  Blood Sugar Ranges: 160-300 fasting sugar   Polyuria: yes   Visual problems: yes, worsening vision, s/p cataract surgery in April 2016   Medication Compliance: yes  Medication Side Effects  Hypoglycemia: no   Preventitive Health Care  Eye Exam: due   Foot Exam: done in October   Diet pattern: "trys to stay on target". Most of times two meals a day   Exercise: no   Patient endorses dry mouth. He has neuropathy. He is followed by pain management.   2. OSA: had sleep study. BiPAP was used and helpful. He has the mask from the sleep study.   Social History  Substance Use Topics  . Smoking status: Never Smoker   . Smokeless tobacco: Not on file  . Alcohol Use: No    Outpatient Prescriptions Prior to Visit  Medication Sig Dispense Refill  . ACCU-CHEK FASTCLIX LANCETS MISC 1 each by Other route 3 (three) times daily. 102 each 11  . acetaminophen-codeine (TYLENOL #3) 300-30 MG tablet Take 1 tablet by mouth every 8 (eight) hours as needed for moderate pain. 60 tablet 2  . allopurinol (ZYLOPRIM) 100 MG tablet Take 100 mg by mouth daily.    [name] aspirin EC 81 MG tablet Take 1 tablet (81 mg total) by mouth daily. 150 tablet 0  . atorvastatin (LIPITOR) 40 MG tablet Take 1 tablet (40 mg total) by mouth daily. 30 tablet 3  . Blood Glucose Monitoring Suppl (ACCU-CHEK AVIVA PLUS) W/DEVICE KIT 1 Device by Does not apply route 3 (three) times daily after meals. E11.9 1 kit 0  . Blood Glucose Monitoring Suppl (ACCU-CHEK AVIVA PLUS) W/DEVICE KIT Used as directed 1 kit 0  . carvedilol (COREG) 12.5 MG tablet Take 12.5 mg by mouth 2 (two) times daily with a meal.    . cyclobenzaprine (FLEXERIL) 10 MG tablet Take 10 mg by mouth 2 (two) times daily as needed for muscle spasms.    [name] diltiazem (DILACOR XR) 180 MG 24 hr  capsule Take 1 capsule (180 mg total) by mouth daily. 30 capsule 3  . furosemide (LASIX) 40 MG tablet Take 1 tablet (40 mg total) by mouth daily. 30 tablet 3  . gabapentin (NEURONTIN) 300 MG capsule Take 2 capsules (600 mg total) by mouth 3 (three) times daily. 180 capsule 5  . glucose blood (ACCU-CHEK AVIVA PLUS) test strip 1 each by Other route 3 (three) times daily. E11.9 100 each 12  . glucose blood test strip Use as instructed 100 each 12  . glucose blood test strip Use as instructed 100 each 12  . hyoscyamine (LEVSIN SL) 0.125 MG SL tablet Place 1 tablet (0.125 mg total) under the tongue every 6 (six) hours as needed. 90 tablet 3  . insulin aspart protamine - aspart (NOVOLOG MIX 70/30 FLEXPEN) (70-30) 100 UNIT/ML FlexPen Inject 0.2 mLs (20 Units total) into the skin 2 (two) times daily with a meal. 15 mL 11  . Insulin Detemir (LEVEMIR FLEXPEN) 100 UNIT/ML Pen Inject 60 Units into the skin daily at 10 pm. 15 mL 11  . Insulin Pen Needle (B-D ULTRAFINE III SHORT PEN) 31G X 8 MM MISC 1 application by Does not apply route daily. 100 each 3  . Lancet Devices (ACCU-CHEK SOFTCLIX) lancets Use as instructed 1 each 0  .  lisinopril (PRINIVIL,ZESTRIL) 30 MG tablet Take 1 tablet (30 mg total) by mouth daily. 30 tablet 3  . LORazepam (ATIVAN) 0.5 MG tablet Take 1 tablet (0.5 mg total) by mouth 2 (two) times daily as needed for sleep. For sleep study 2 tablet 0  . nystatin (MYCOSTATIN) 100000 UNIT/ML suspension Take 5 mLs (500,000 Units total) by mouth 4 (four) times daily. 60 mL 0  . pantoprazole (PROTONIX) 40 MG tablet Take 1 tablet (40 mg total) by mouth 2 (two) times daily. 60 tablet 4  . potassium chloride SA (K-DUR,KLOR-CON) 20 MEQ tablet Take 2 tablets (40 mEq total) by mouth 2 (two) times daily. (Patient not taking: Reported on 04/19/2015) 32 tablet 0  . predniSONE (DELTASONE) 10 MG tablet Take 1 tablet (10 mg total) by mouth daily with breakfast. 30 tablet 1  . ranitidine (ZANTAC) 150 MG capsule Take  1 capsule (150 mg total) by mouth 2 (two) times daily. 60 capsule 3  . sildenafil (VIAGRA) 100 MG tablet Take 100 mg by mouth daily as needed for erectile dysfunction.     No facility-administered medications prior to visit.    ROS Review of Systems  Constitutional: Negative for fever, chills, fatigue and unexpected weight change.  HENT:       Dry mouth   Eyes: Negative for visual disturbance.  Respiratory: Negative for cough and shortness of breath.   Cardiovascular: Negative for chest pain, palpitations and leg swelling.  Gastrointestinal: Negative for nausea, vomiting, abdominal pain, diarrhea, constipation and blood in stool.  Endocrine: Negative for polydipsia, polyphagia and polyuria.  Musculoskeletal: Positive for myalgias and arthralgias. Negative for back pain, gait problem and neck pain.  Skin: Negative for rash.  Allergic/Immunologic: Negative for immunocompromised state.  Neurological: Positive for numbness.  Hematological: Negative for adenopathy. Does not bruise/bleed easily.  Psychiatric/Behavioral: Negative for suicidal ideas, sleep disturbance and dysphoric mood. The patient is not nervous/anxious.    Objective:  BP 119/80 mmHg  Pulse 80  Temp(Src) 98 F (36.7 C) (Oral)  Resp 16  Ht 5' 6" (1.676 m)  Wt 221 lb (100.245 kg)  BMI 35.69 kg/m2  SpO2 96%  BP/Weight 07/12/2015 06/14/2015 62/08/3084  Systolic BP 578 - 469  Diastolic BP 80 - 80  Wt. (Lbs) 221 223 223  BMI 35.69 36.01 36.01   Physical Exam  Constitutional: He appears well-developed and well-nourished. No distress.  HENT:  Head: Normocephalic and atraumatic.  Mouth/Throat: Mucous membranes are dry.  Neck: Normal range of motion. Neck supple.  Cardiovascular: Normal rate, regular rhythm, normal heart sounds and intact distal pulses.   Pulmonary/Chest: Effort normal and breath sounds normal.  Musculoskeletal: He exhibits no edema.  Neurological: He is alert.  Skin: Skin is warm and dry. No rash  noted. No erythema.  Psychiatric: He has a normal mood and affect.   Lab Results  Component Value Date   HGBA1C 11.90 05/16/2015   CBG 350   Treated with 5 U of novolog   Repeat CBG 346 Assessment & Plan:   Problem List Items Addressed This Visit    DM2 (diabetes mellitus, type 2) ([place]) - Primary (Chronic)   Relevant Medications   insulin aspart protamine - aspart (NOVOLOG MIX 70/30 FLEXPEN) (70-30) 100 UNIT/ML FlexPen   insulin aspart (NOVOLOG) cartridge 5 Units (Completed)   Other Relevant Orders   Glucose (CBG) (Completed)   Ambulatory referral to Endocrinology   Ambulatory referral to Ophthalmology   Ambulatory referral to Podiatry   POCT glucose (manual entry) (Completed)  OSA (obstructive sleep apnea) (Chronic)    BiPAP ordered and faxed to [facility]       Relevant Orders   For home use only DME Bipap    Other Visit Diagnoses    Oral thrush        Relevant Medications    clotrimazole (MYCELEX) 10 MG troche       No orders of the defined types were placed in this encounter.    Follow-up: No Follow-up on file.   Boykin Nearing MD

## 2015-07-13 ENCOUNTER — Telehealth: Payer: Self-pay | Admitting: Family Medicine

## 2015-07-13 ENCOUNTER — Other Ambulatory Visit: Payer: Self-pay | Admitting: Family Medicine

## 2015-07-13 DIAGNOSIS — K148 Other diseases of tongue: Secondary | ICD-10-CM

## 2015-07-13 MED ORDER — FLUCONAZOLE 10 MG/ML PO SUSR
200.0000 mg | Freq: Every day | ORAL | Status: DC
Start: 2015-07-13 — End: 2015-09-20

## 2015-07-13 NOTE — Telephone Encounter (Signed)
Pt stated he preferred to have the liquid form  Medication was change

## 2015-07-13 NOTE — Telephone Encounter (Signed)
Please call back to patient, the troche is suppose to dissolve in his mouth then he swallows it. Has he tried this yet?

## 2015-07-13 NOTE — Telephone Encounter (Signed)
Pt. Called requesting to speak to nurse regarding a wrong Rx that was prescribed to him. The medication that was prescribe to him was clotrimazole (MYCELEX) 10 MG troche, pt. Stated that he does not know how to take this medication, he stated that last time the PCP prescribed a different med and it was suppose to dissolve in his throat, and this medication is different. Please f/u with pt. ASAP.

## 2015-07-21 ENCOUNTER — Telehealth: Payer: Self-pay | Admitting: Physician Assistant

## 2015-08-01 ENCOUNTER — Other Ambulatory Visit: Payer: Self-pay | Admitting: *Deleted

## 2015-08-01 MED ORDER — LISINOPRIL 30 MG PO TABS: 30.0000 mg | ORAL_TABLET | Freq: Every day | ORAL | Status: DC

## 2015-08-02 ENCOUNTER — Other Ambulatory Visit: Payer: Self-pay | Admitting: Physician Assistant

## 2015-08-02 ENCOUNTER — Other Ambulatory Visit: Payer: Self-pay | Admitting: Family Medicine

## 2015-08-02 ENCOUNTER — Telehealth: Payer: Self-pay | Admitting: *Deleted

## 2015-08-02 ENCOUNTER — Telehealth: Payer: Self-pay | Admitting: Family Medicine

## 2015-08-02 DIAGNOSIS — E1165 Type 2 diabetes mellitus with hyperglycemia: Secondary | ICD-10-CM

## 2015-08-02 NOTE — Telephone Encounter (Signed)
Patient called requesting a medication refill for insulin needles. Patient is needing the B-D Needles nano Please follow up.

## 2015-08-02 NOTE — Telephone Encounter (Signed)
Received refill request from pharmacy for Ranitidine 150 mg, twice daily.  Per Cecille Rubin Hvozdovic PA , send refills for this patient.

## 2015-08-03 MED ORDER — ACCU-CHEK SOFTCLIX LANCET DEV MISC: Status: DC

## 2015-08-03 MED ORDER — INSULIN PEN NEEDLE 31G X 8 MM MISC: 1.0000 "application " | Freq: Every day | Status: DC

## 2015-08-03 NOTE — Telephone Encounter (Signed)
Rx send to [facility] 

## 2015-08-07 ENCOUNTER — Telehealth: Payer: Self-pay | Admitting: Family Medicine

## 2015-08-07 DIAGNOSIS — M2550 Pain in unspecified joint: Secondary | ICD-10-CM

## 2015-08-07 NOTE — Telephone Encounter (Signed)
Pt. Does not understand why the prednisone and allopurinol (ZYLOPRIM) 100 MG tablet was not prescribed  Please call patient as soon as possible. He says he is going to break out if not taken soon.

## 2015-08-07 NOTE — Telephone Encounter (Signed)
Patient called requesting a medication refill for, Furosemide, Prednisone,( Patient states that its used for joint pain) Albuterol,allopurinol (ZYLOPRIM), diltiazem Please follow up with patient.

## 2015-08-07 NOTE — Telephone Encounter (Signed)
I have refilled the furosemide x 1 - patient needs a new lab panel as his potassium has been low in the past. I have also refilled allopurinol x 1, but patient needs a visit with Dr. Adrian Blackwater for more refills.  I have refilled the diltiazem.  Will have Dr. Adrian Blackwater determine the appropriateness of prednisone and albuterol. Patient does not have lung disease diagnosis or history of albuterol use so will defer to her.

## 2015-08-08 ENCOUNTER — Other Ambulatory Visit: Payer: Self-pay | Admitting: Family Medicine

## 2015-08-08 ENCOUNTER — Telehealth: Payer: Self-pay | Admitting: Family Medicine

## 2015-08-08 DIAGNOSIS — E119 Type 2 diabetes mellitus without complications: Secondary | ICD-10-CM

## 2015-08-08 MED ORDER — INSULIN PEN NEEDLE 32G X 4 MM MISC: 1.0000 | Freq: Three times a day (TID) | Status: DC

## 2015-08-08 MED ORDER — PREDNISONE 10 MG PO TABS: 10.0000 mg | ORAL_TABLET | Freq: Every day | ORAL | Status: DC

## 2015-08-08 NOTE — Telephone Encounter (Signed)
Pt. returned call. Please f/u with pt. °

## 2015-08-08 NOTE — Telephone Encounter (Signed)
Called patient to clarify request for prednisone and albuterol Got VM Asked to call back  When patient calls back please have RMA or nurse inform him of the following,  Sent in prednisone Regarding albuterol request, this is not on his active med list,  ? Asthma, ? COPD, ? Wheezing. Is this new problem. facilitate OV if new problem. Give 1 albuterol inhaler if this is a flare up of a former problem.

## 2015-08-08 NOTE — Telephone Encounter (Signed)
Please have patient call his rheumatologist regarding prednisone, it is preferred that he not be on long term prednisone if there are other treatment options due to his poorly controlled diabetes.    Why is he requesting albuterol, this can worsen his paroxysmal A fib. He has not documentation of asthma or COPD.

## 2015-08-09 ENCOUNTER — Encounter: Payer: Self-pay | Admitting: Family Medicine

## 2015-08-09 NOTE — Telephone Encounter (Signed)
Advised patient of prednisone being sent in but patient needs to be advised further about pcp notes from the 28th attached to the telephone note of the 27th. Please follow up with pt. Thank you.

## 2015-08-14 ENCOUNTER — Ambulatory Visit (HOSPITAL_BASED_OUTPATIENT_CLINIC_OR_DEPARTMENT_OTHER): Payer: Medicaid Other | Attending: Nurse Practitioner

## 2015-08-14 VITALS — Ht 66.0 in | Wt 220.0 lb

## 2015-08-14 DIAGNOSIS — G4731 Primary central sleep apnea: Secondary | ICD-10-CM | POA: Diagnosis not present

## 2015-08-14 DIAGNOSIS — G4730 Sleep apnea, unspecified: Secondary | ICD-10-CM | POA: Diagnosis present

## 2015-08-14 DIAGNOSIS — I493 Ventricular premature depolarization: Secondary | ICD-10-CM | POA: Diagnosis not present

## 2015-08-14 DIAGNOSIS — G4733 Obstructive sleep apnea (adult) (pediatric): Secondary | ICD-10-CM | POA: Diagnosis not present

## 2015-08-14 DIAGNOSIS — R0683 Snoring: Secondary | ICD-10-CM | POA: Diagnosis not present

## 2015-08-15 ENCOUNTER — Telehealth: Payer: Self-pay | Admitting: *Deleted

## 2015-08-15 MED ORDER — ALLOPURINOL 100 MG PO TABS: 100.0000 mg | ORAL_TABLET | Freq: Every day | ORAL | Status: DC

## 2015-08-15 NOTE — Telephone Encounter (Signed)
Done

## 2015-08-19 DIAGNOSIS — G4733 Obstructive sleep apnea (adult) (pediatric): Secondary | ICD-10-CM | POA: Diagnosis not present

## 2015-08-19 NOTE — Progress Notes (Signed)
   NAME: Marco [name] DATE OF BIRTH:  March 16, 1956 MEDICAL RECORD NUMBER [id number]  LOCATION: [facility]  PHYSICIAN: Genesee Nase D  DATE OF STUDY: 08/14/2015 CLINICAL INFORMATION The patient is referred for a BiPAP titration to treat sleep apnea.   Date of NPSG, [place] or HST:  Diagnostic NPSG  06/14/15,   AHI 37.8/ hr, with obstructive and central events "complex", desat to 76%, body weight 223 lbs  SLEEP STUDY TECHNIQUE As per the AASM Manual for the Scoring of Sleep and Associated Events v2.3 (April 2016) with a hypopnea requiring 4% desaturations. The channels recorded and monitored were frontal, central and occipital EEG, electrooculogram (EOG), submentalis EMG (chin), nasal and oral airflow, thoracic and abdominal wall motion, anterior tibialis EMG, snore microphone, electrocardiogram, and pulse oximetry. Bilevel positive airway pressure (BPAP) was initiated at the beginning of the study and titrated to treat sleep-disordered breathing.  MEDICATIONS Medications taken by the patient : charted for review Medications administered by patient during sleep study : No sleep medicine administered.  RESPIRATORY PARAMETERS Optimal IPAP Pressure (cm): 20 AHI at Optimal Pressure (/hr) 8.5 Optimal EPAP Pressure (cm): 16     Overall Minimal O2 (%): 80.00 Minimal O2 at Optimal Pressure (%): 80.00  SLEEP ARCHITECTURE Start Time: 10:53:06 PM Stop Time: 5:03:07 AM Total Time (min): 370.0 Total Sleep Time (min): 248.0 Sleep Latency (min): 0.4 Sleep Efficiency (%): 67.0 REM Latency (min): 62.0 WASO (min): 121.6 Stage N1 (%): 8.06 Stage N2 (%): 67.54 Stage N3 (%): 0.00 Stage R (%): 24.40 Supine (%): 94.35 Arousal Index (/hr): 4.1      CARDIAC DATA The 2 lead EKG demonstrated sinus rhythm. The mean heart rate was 69.18 beats per minute. Other EKG findings include: PVCs.  LEG MOVEMENT DATA The total Periodic Limb Movements of Sleep (PLMS) were 0. The PLMS index was 0.00.  A PLMS index of <15 is considered normal in adults.  IMPRESSIONS - An optimal BiPAP pressure was selected for this patient ( 20 /16 cm of water) - Mild Central Sleep Apnea was noted during this titration (CAI = 7.5/h). - Severe oxygen desaturations were observed during this titration (min O2 = 80.00%). - The patient snored with Moderate snoring volume. - 2-lead EKG demonstrated: PVCs - Clinically significant periodic limb movements were not noted during this study. Arousals associated with PLMs were rare.  DIAGNOSIS - Obstructive Sleep Apnea (327.23 [G47.33 ICD-10])  RECOMMENDATIONS - Trial of BiPAP therapy on 20/16 cm H2O with a Medium size Fisher[employer]Paykel Full Face Mask Simplus mask and heated humidification. - Avoid alcohol, sedatives and other CNS depressants that may worsen sleep apnea and disrupt normal sleep architecture. - Sleep hygiene should be reviewed to assess factors that may improve sleep quality. - Weight management and regular exercise should be initiated or continued.   Deneise Lever Diplomate, American Board of Sleep Medicine  ELECTRONICALLY SIGNED ON:  08/19/2015, 2:34 PM [place] PH: [phone]   FX: [phone] [place]

## 2015-08-20 ENCOUNTER — Ambulatory Visit: Payer: Medicaid Other | Admitting: Sports Medicine

## 2015-08-20 ENCOUNTER — Ambulatory Visit (HOSPITAL_BASED_OUTPATIENT_CLINIC_OR_DEPARTMENT_OTHER): Payer: Medicaid Other

## 2015-08-22 ENCOUNTER — Encounter: Payer: Self-pay | Admitting: Sports Medicine

## 2015-08-28 ENCOUNTER — Other Ambulatory Visit: Payer: Self-pay | Admitting: Physician Assistant

## 2015-08-28 ENCOUNTER — Other Ambulatory Visit: Payer: Self-pay | Admitting: Family Medicine

## 2015-09-03 ENCOUNTER — Encounter: Payer: Self-pay | Admitting: Sports Medicine

## 2015-09-03 ENCOUNTER — Ambulatory Visit (INDEPENDENT_AMBULATORY_CARE_PROVIDER_SITE_OTHER): Payer: Medicaid Other | Admitting: Sports Medicine

## 2015-09-03 ENCOUNTER — Ambulatory Visit: Payer: Medicaid Other | Admitting: Sports Medicine

## 2015-09-03 ENCOUNTER — Ambulatory Visit (INDEPENDENT_AMBULATORY_CARE_PROVIDER_SITE_OTHER): Payer: Medicaid Other

## 2015-09-03 DIAGNOSIS — M79673 Pain in unspecified foot: Secondary | ICD-10-CM

## 2015-09-03 DIAGNOSIS — E1142 Type 2 diabetes mellitus with diabetic polyneuropathy: Secondary | ICD-10-CM

## 2015-09-03 MED ORDER — CAPSAICIN 0.075 % EX CREA: 1.0000 "application " | TOPICAL_CREAM | Freq: Two times a day (BID) | CUTANEOUS | Status: DC

## 2015-09-03 NOTE — Patient Instructions (Signed)
Diabetes and Foot Care Diabetes may cause you to have problems because of poor blood supply (circulation) to your feet and legs. This may cause the skin on your feet to become thinner, break easier, and heal more slowly. Your skin may become dry, and the skin may peel and crack. You may also have nerve damage in your legs and feet causing decreased feeling in them. You may not notice minor injuries to your feet that could lead to infections or more serious problems. Taking care of your feet is one of the most important things you can do for yourself.  HOME CARE INSTRUCTIONS  Wear shoes at all times, even in the house. Do not go barefoot. Bare feet are easily injured.  Check your feet daily for blisters, cuts, and redness. If you cannot see the bottom of your feet, use a mirror or ask someone for help.  Wash your feet with warm water (do not use hot water) and mild soap. Then pat your feet and the areas between your toes until they are completely dry. Do not soak your feet as this can dry your skin.  Apply a moisturizing lotion or petroleum jelly (that does not contain alcohol and is unscented) to the skin on your feet and to dry, brittle toenails. Do not apply lotion between your toes.  Trim your toenails straight across. Do not dig under them or around the cuticle. File the edges of your nails with an emery board or nail file.  Do not cut corns or calluses or try to remove them with medicine.  Wear clean socks or stockings every day. Make sure they are not too tight. Do not wear knee-high stockings since they may decrease blood flow to your legs.  Wear shoes that fit properly and have enough cushioning. To break in new shoes, wear them for just a few hours a day. This prevents you from injuring your feet. Always look in your shoes before you put them on to be sure there are no objects inside.  Do not cross your legs. This may decrease the blood flow to your feet.  If you find a minor scrape,  cut, or break in the skin on your feet, keep it and the skin around it clean and dry. These areas may be cleansed with mild soap and water. Do not cleanse the area with peroxide, alcohol, or iodine.  When you remove an adhesive bandage, be sure not to damage the skin around it.  If you have a wound, look at it several times a day to make sure it is healing.  Do not use heating pads or hot water bottles. They may burn your skin. If you have lost feeling in your feet or legs, you may not know it is happening until it is too late.  Make sure your health care provider performs a complete foot exam at least annually or more often if you have foot problems. Report any cuts, sores, or bruises to your health care provider immediately. SEEK MEDICAL CARE IF:   You have an injury that is not healing.  You have cuts or breaks in the skin.  You have an ingrown nail.  You notice redness on your legs or feet.  You feel burning or tingling in your legs or feet.  You have pain or cramps in your legs and feet.  Your legs or feet are numb.  Your feet always feel cold. SEEK IMMEDIATE MEDICAL CARE IF:   There is increasing redness,   swelling, or pain in or around a wound.  There is a red line that goes up your leg.  Pus is coming from a wound.  You develop a fever or as directed by your health care provider.  You notice a bad smell coming from an ulcer or wound.   This information is not intended to replace advice given to you by your health care provider. Make sure you discuss any questions you have with your health care provider.   Document Released: 07/25/2000 Document Revised: 03/30/2013 Document Reviewed: 01/04/2013 Elsevier Interactive Patient Education 2016 [employer]  

## 2015-09-03 NOTE — Progress Notes (Signed)
Patient ID: Marco [name], male   DOB: 08-Mar-1956, 60 y.o.   MRN: [id number] Subjective: Marco [name] is a 60 y.o. male patient with history of type  2 diabetes who presents to office today complaining of burning pain that is getting worse. Patient states that the glucose reading this morning was 150 mg/dl. Patient denies any new changes in medication or new problems. Patient denies any new cramping, numbness, burning or tingling in the legs. Admits sees pain management doctor who may put him on Lyrica and stop the Gabapentin.   Patient Active Problem List   Diagnosis Date Noted  . OSA (obstructive sleep apnea) 07/12/2015  . Joint pain 06/01/2015  . Low serum testosterone level 05/18/2015  . Depression 05/16/2015  . Morbid obesity ([place])   . Chronic pain   . Non-obstructive CAD   . Tongue discoloration 02/20/2015  . Rheumatoid arthritis ([place]) 02/05/2015  . Chest pain at rest   . Elevated troponin 12/24/2014  . CAD (coronary artery disease) 12/24/2014  . PAF (paroxysmal atrial fibrillation) ([place]) 12/24/2014  . Essential hypertension 12/24/2014  . HLD (hyperlipidemia) 12/24/2014  . CKD (chronic kidney disease) 12/24/2014  . PUD (peptic ulcer disease) 12/24/2014  . DM2 (diabetes mellitus, type 2) ([place]) 12/24/2014  . Chest pain 12/23/2014   Current Outpatient Prescriptions on File Prior to Visit  Medication Sig Dispense Refill  . ACCU-CHEK FASTCLIX LANCETS MISC 1 each by Other route 3 (three) times daily. 102 each 11  . allopurinol (ZYLOPRIM) 100 MG tablet Take 1 tablet (100 mg total) by mouth daily. 30 tablet 1  . aspirin EC 81 MG tablet Take 1 tablet (81 mg total) by mouth daily. 150 tablet 0  . atorvastatin (LIPITOR) 40 MG tablet TAKE ONE TABLET BY MOUTH ONCE DAILY 90 tablet 1  . Blood Glucose Monitoring Suppl (ACCU-CHEK AVIVA PLUS) W/DEVICE KIT 1 Device by Does not apply route 3 (three) times daily after meals. E11.9 1 kit 0  . Blood Glucose Monitoring Suppl (ACCU-CHEK AVIVA PLUS)  W/DEVICE KIT Used as directed 1 kit 0  . carvedilol (COREG) 12.5 MG tablet TAKE ONE TABLET BY MOUTH TWICE DAILY 60 tablet 2  . cyclobenzaprine (FLEXERIL) 10 MG tablet Take 10 mg by mouth 2 (two) times daily as needed for muscle spasms.    [name] diltiazem (DILACOR XR) 180 MG 24 hr capsule TAKE ONE CAPSULE BY MOUTH ONCE DAILY 30 capsule 3  . fluconazole (DIFLUCAN) 10 MG/ML suspension Take 20 mLs (200 mg total) by mouth daily. For 7-14 days 140 mL 1  . furosemide (LASIX) 40 MG tablet TAKE ONE TABLET BY MOUTH ONCE DAILY 30 tablet 0  . gabapentin (NEURONTIN) 300 MG capsule Take 2 capsules (600 mg total) by mouth 3 (three) times daily. 180 capsule 5  . glucose blood (ACCU-CHEK AVIVA PLUS) test strip 1 each by Other route 3 (three) times daily. E11.9 100 each 12  . glucose blood test strip Use as instructed 100 each 12  . glucose blood test strip Use as instructed 100 each 12  . HYDROcodone-acetaminophen (NORCO/VICODIN) 5-325 MG tablet Take 1 tablet by mouth every 8 (eight) hours as needed for moderate pain. Per pain management    . hyoscyamine (LEVSIN SL) 0.125 MG SL tablet Place 1 tablet (0.125 mg total) under the tongue every 6 (six) hours as needed. 90 tablet 3  . insulin aspart protamine - aspart (NOVOLOG MIX 70/30 FLEXPEN) (70-30) 100 UNIT/ML FlexPen Inject 0.25 mLs (25 Units total) into the skin 2 (two) times daily  with a meal. 15 mL 11  . Insulin Detemir (LEVEMIR FLEXPEN) 100 UNIT/ML Pen Inject 60 Units into the skin daily at 10 pm. 15 mL 11  . Insulin Pen Needle (BD PEN NEEDLE NANO U/F) 32G X 4 MM MISC 1 each by Does not apply route 3 (three) times daily. 100 each 11  . Lancet Devices (ACCU-CHEK SOFTCLIX) lancets Use as instructed 1 each 11  . lisinopril (PRINIVIL,ZESTRIL) 30 MG tablet Take 1 tablet (30 mg total) by mouth daily. 30 tablet 3  . LORazepam (ATIVAN) 0.5 MG tablet Take 1 tablet (0.5 mg total) by mouth 2 (two) times daily as needed for sleep. For sleep study 2 tablet 0  . pantoprazole  (PROTONIX) 40 MG tablet Take 1 tablet (40 mg total) by mouth 2 (two) times daily. 60 tablet 4  . potassium chloride SA (K-DUR,KLOR-CON) 20 MEQ tablet Take 2 tablets (40 mEq total) by mouth 2 (two) times daily. (Patient not taking: Reported on 04/19/2015) 32 tablet 0  . predniSONE (DELTASONE) 10 MG tablet Take 1 tablet (10 mg total) by mouth daily with breakfast. 30 tablet 1  . ranitidine (ZANTAC) 150 MG capsule TAKE ONE CAPSULE BY MOUTH TWICE DAILY 60 capsule 0  . ranitidine (ZANTAC) 150 MG capsule Take 1 capsule (150 mg total) by mouth 2 (two) times daily. 60 capsule 3  . sildenafil (VIAGRA) 100 MG tablet Take 100 mg by mouth daily as needed for erectile dysfunction.     No current facility-administered medications on file prior to visit.   No Known Allergies   Objective: General: Patient is awake, alert, and oriented x 3 and in no acute distress.  Integument: Skin is warm, dry and supple bilateral. Nails are short and dystrophic, 1-5 bilateral. No signs of infection. No open lesions or preulcerative lesions present bilateral. Remaining integument unremarkable.  Vasculature:  Dorsalis Pedis pulse 2/4 bilateral. Posterior Tibial pulse  1/4 bilateral.  Capillary fill time <3 sec 1-5 bilateral. Scant hair growth to the level of the digits. Temperature gradient within normal limits. No varicosities present bilateral. No edema present bilateral.   Neurology: The patient has intact sensation measured with a 5.07/10g Semmes Weinstein Monofilament at all pedal sites bilateral . Vibratory sensation diminished bilateral with tuning fork. No Babinski sign present bilateral. Subjective burning bilateral.  Musculoskeletal: No gross pedal deformities noted bilateral. Muscular strength 5/5 in all lower extremity muscular groups bilateral without pain or limitation on range of motion . No tenderness with calf compression bilateral.  Xrays, Left and Right foot: Normal osseous mineralization, 1st MTPJ  narrowing, calcaneal spurs present, no other pathology, soft tissues within normal limits, no foreign body.   Assessment and Plan: Problem List Items Addressed This Visit    None    Visit Diagnoses    Foot pain, unspecified laterality    -  Primary    Relevant Orders    DG Foot 2 Views Left    DG Foot 2 Views Right    Diabetic polyneuropathy associated with type 2 diabetes mellitus (HCC)        Relevant Medications    capsicum (ZOSTRIX) 0.075 % topical cream      -Examined patient. -Discussed and educated patient on diabetic foot care, especially with  regards to the vascular, neurological and musculoskeletal systems.  -Stressed the importance of good glycemic control and the detriment of not  controlling glucose levels in relation to the foot. -Rx Capsaicin cream to apply daily to both feet -Advised patient to further  discuss with pain management doctor Lyrica for neuropathic pain -Advised patient to refrain from trimming own nails; to allow doctors to trim nails only to prevent complications -Answered all patient questions -Patient to return in 3 months for at risk foot care -Patient advised to call the office if any problems or questions arise in the meantime.  Landis Martins, DPM

## 2015-09-04 ENCOUNTER — Other Ambulatory Visit: Payer: Self-pay | Admitting: Physician Assistant

## 2015-09-04 ENCOUNTER — Other Ambulatory Visit: Payer: Self-pay | Admitting: Family Medicine

## 2015-09-09 ENCOUNTER — Other Ambulatory Visit: Payer: Self-pay | Admitting: Physician Assistant

## 2015-09-17 ENCOUNTER — Encounter: Payer: Self-pay | Admitting: Family Medicine

## 2015-09-17 NOTE — Progress Notes (Signed)
Patient ID: Marco [name], male   DOB: [birth year], 60 y.o.   MRN: [id number] Diabetes followed by endocrinology Last OV 09/04/15 Changed to novolog 10 + 2 sliding scale TID D/Cd novolog 70/30 Continued levemir 60 U qHS

## 2015-09-20 ENCOUNTER — Ambulatory Visit: Payer: Medicaid Other | Admitting: Family Medicine

## 2015-09-20 ENCOUNTER — Encounter: Payer: Self-pay | Admitting: Family Medicine

## 2015-09-20 ENCOUNTER — Other Ambulatory Visit: Payer: Self-pay

## 2015-09-20 ENCOUNTER — Emergency Department (HOSPITAL_COMMUNITY)
Admission: EM | Admit: 2015-09-20 | Discharge: 2015-09-20 | Disposition: A | Discharge status: Home / Self Care | Payer: Medicaid Other | Attending: Physician Assistant | Admitting: Physician Assistant

## 2015-09-20 ENCOUNTER — Ambulatory Visit: Payer: Medicaid Other | Attending: Family Medicine | Admitting: Family Medicine

## 2015-09-20 ENCOUNTER — Encounter (HOSPITAL_COMMUNITY): Payer: Self-pay

## 2015-09-20 ENCOUNTER — Emergency Department (HOSPITAL_COMMUNITY): Payer: Medicaid Other

## 2015-09-20 VITALS — BP 144/88 | HR 93 | Temp 98.7°F | Resp 16 | Ht 66.0 in | Wt 219.0 lb

## 2015-09-20 DIAGNOSIS — Z79899 Other long term (current) drug therapy: Secondary | ICD-10-CM | POA: Diagnosis not present

## 2015-09-20 DIAGNOSIS — Z794 Long term (current) use of insulin: Secondary | ICD-10-CM | POA: Insufficient documentation

## 2015-09-20 DIAGNOSIS — R51 Headache: Secondary | ICD-10-CM | POA: Insufficient documentation

## 2015-09-20 DIAGNOSIS — I10 Essential (primary) hypertension: Secondary | ICD-10-CM | POA: Diagnosis not present

## 2015-09-20 DIAGNOSIS — K219 Gastro-esophageal reflux disease without esophagitis: Secondary | ICD-10-CM | POA: Insufficient documentation

## 2015-09-20 DIAGNOSIS — R0602 Shortness of breath: Secondary | ICD-10-CM | POA: Insufficient documentation

## 2015-09-20 DIAGNOSIS — Z86010 Personal history of colonic polyps: Secondary | ICD-10-CM | POA: Diagnosis not present

## 2015-09-20 DIAGNOSIS — G8929 Other chronic pain: Secondary | ICD-10-CM | POA: Diagnosis not present

## 2015-09-20 DIAGNOSIS — R079 Chest pain, unspecified: Secondary | ICD-10-CM | POA: Insufficient documentation

## 2015-09-20 DIAGNOSIS — Z7982 Long term (current) use of aspirin: Secondary | ICD-10-CM | POA: Insufficient documentation

## 2015-09-20 DIAGNOSIS — R519 Headache, unspecified: Secondary | ICD-10-CM

## 2015-09-20 DIAGNOSIS — E119 Type 2 diabetes mellitus without complications: Secondary | ICD-10-CM | POA: Insufficient documentation

## 2015-09-20 DIAGNOSIS — Z7952 Long term (current) use of systemic steroids: Secondary | ICD-10-CM | POA: Insufficient documentation

## 2015-09-20 LAB — BASIC METABOLIC PANEL
Anion gap: 12 (ref 5–15)
BUN: 10 mg/dL (ref 6–20)
CALCIUM: 9 mg/dL (ref 8.9–10.3)
CO2: 30 mmol/L (ref 22–32)
CREATININE: 1.1 mg/dL (ref 0.61–1.24)
Chloride: 100 mmol/L — ABNORMAL LOW (ref 101–111)
GFR calc Af Amer: >60 mL/min (ref >60)
GLUCOSE: 225 mg/dL — AB (ref 65–99)
POTASSIUM: 3.2 mmol/L — AB (ref 3.5–5.1)
SODIUM: 142 mmol/L (ref 135–145)

## 2015-09-20 LAB — CBC WITH DIFFERENTIAL/PLATELET
BASOS ABS: 0 10*3/uL (ref 0.0–0.1)
Basophils Relative: 0 %
Eosinophils Absolute: 0.2 10*3/uL (ref 0.0–0.7)
Eosinophils Relative: 2 %
HCT: 41.6 % (ref 39.0–52.0)
Hemoglobin: 13.6 g/dL (ref 13.0–17.0)
LYMPHS ABS: 2.9 10*3/uL (ref 0.7–4.0)
Lymphocytes Relative: 33 %
MCH: 30.2 pg (ref 26.0–34.0)
MCHC: 32.7 g/dL (ref 30.0–36.0)
MCV: 92.2 fL (ref 78.0–100.0)
MONO ABS: 1.1 10*3/uL — AB (ref 0.1–1.0)
MONOS PCT: 12 %
NEUTROS ABS: 4.6 10*3/uL (ref 1.7–7.7)
Neutrophils Relative %: 53 %
PLATELETS: 233 10*3/uL (ref 150–400)
RBC: 4.51 MIL/uL (ref 4.22–5.81)
RDW: 14.5 % (ref 11.5–15.5)
WBC: 8.8 10*3/uL (ref 4.0–10.5)

## 2015-09-20 LAB — BRAIN NATRIURETIC PEPTIDE: B NATRIURETIC PEPTIDE 5: 33.3 pg/mL (ref 0.0–100.0)

## 2015-09-20 LAB — GLUCOSE, POCT (MANUAL RESULT ENTRY): POC Glucose: 223 mg/dl — AB (ref 70–99)

## 2015-09-20 LAB — I-STAT TROPONIN, ED
TROPONIN I, POC: 0.05 ng/mL (ref 0.00–0.08)
TROPONIN I, POC: 0.06 ng/mL (ref 0.00–0.08)

## 2015-09-20 LAB — POCT GLYCOSYLATED HEMOGLOBIN (HGB A1C): HEMOGLOBIN A1C: 12.1

## 2015-09-20 MED ORDER — ASPIRIN 81 MG PO CHEW
324.0000 mg | CHEWABLE_TABLET | Freq: Once | ORAL | Status: AC
  Administered 2015-09-20: 324 mg via ORAL
  Filled 2015-09-20: qty 4

## 2015-09-20 MED ORDER — AMITRIPTYLINE HCL 50 MG PO TABS: 50.0000 mg | ORAL_TABLET | Freq: Every day | ORAL | Status: DC

## 2015-09-20 MED ORDER — ACETAMINOPHEN 325 MG PO TABS
650.0000 mg | ORAL_TABLET | Freq: Once | ORAL | Status: AC
  Administered 2015-09-20: 650 mg via ORAL
  Filled 2015-09-20: qty 2

## 2015-09-20 NOTE — Progress Notes (Signed)
Subjective:  Patient ID: Marco [name], male    DOB: 1955/09/29  Age: 60 y.o. MRN: [id number]  CC: Headache and Chest Pain   HPI Marco [name] presents for    1. Headache: sharp pain shooting through his head. Both temples and forehead. Last less than one minute. After the initial pain there is residual pain that is throbbing. Symptoms started about 2 months ago and were sporadic. Over the past two weeks symptoms have been more intense and more frequent. No fever. No nausea or vomiting. No head trauma. No ringing in ears or hearing loss.   2. Chest pain: has long history of chest pain. Last saw cardiology in 02/2015 who felt it was non cardiac. He reports 2-3 weeks of sharp, piercing pain in L upper chest. Also getting substernal tightening with shortness of breath. Sharp pains last for hours. Pain today started at 5:30 AM. Taking deep breaths hurt. No nausea or emesis. He has not gone to ED. Pain is 7/10 right now. Having pressure going down both arms which is new. Also endorsing sweating and fatigue. He takes vicodin for chronic pain but reports that it has not helped his chest pain.   Social History  Substance Use Topics  . Smoking status: Never Smoker   . Smokeless tobacco: Not on file  . Alcohol Use: No    Outpatient Prescriptions Prior to Visit  Medication Sig Dispense Refill  . ACCU-CHEK FASTCLIX LANCETS MISC 1 each by Other route 3 (three) times daily. 102 each 11  . allopurinol (ZYLOPRIM) 100 MG tablet Take 1 tablet (100 mg total) by mouth daily. 30 tablet 1  . aspirin EC 81 MG tablet Take 1 tablet (81 mg total) by mouth daily. 150 tablet 0  . atorvastatin (LIPITOR) 40 MG tablet TAKE ONE TABLET BY MOUTH ONCE DAILY 90 tablet 1  . Blood Glucose Monitoring Suppl (ACCU-CHEK AVIVA PLUS) W/DEVICE KIT 1 Device by Does not apply route 3 (three) times daily after meals. E11.9 1 kit 0  . Blood Glucose Monitoring Suppl (ACCU-CHEK AVIVA PLUS) W/DEVICE KIT Used as directed 1 kit 0  .  capsicum (ZOSTRIX) 0.075 % topical cream Apply 1 application topically 2 (two) times daily. 28.3 g 0  . carvedilol (COREG) 12.5 MG tablet TAKE ONE TABLET BY MOUTH TWICE DAILY 60 tablet 2  . cyclobenzaprine (FLEXERIL) 10 MG tablet Take 10 mg by mouth 2 (two) times daily as needed for muscle spasms.    [name] diltiazem (DILACOR XR) 180 MG 24 hr capsule TAKE ONE CAPSULE BY MOUTH ONCE DAILY 30 capsule 3  . fluconazole (DIFLUCAN) 10 MG/ML suspension Take 20 mLs (200 mg total) by mouth daily. For 7-14 days 140 mL 1  . furosemide (LASIX) 40 MG tablet TAKE ONE TABLET BY MOUTH ONCE DAILY 30 tablet 0  . gabapentin (NEURONTIN) 300 MG capsule Take 2 capsules (600 mg total) by mouth 3 (three) times daily. 180 capsule 5  . glucose blood (ACCU-CHEK AVIVA PLUS) test strip 1 each by Other route 3 (three) times daily. E11.9 100 each 12  . glucose blood test strip Use as instructed 100 each 12  . glucose blood test strip Use as instructed 100 each 12  . HYDROcodone-acetaminophen (NORCO/VICODIN) 5-325 MG tablet Take 1 tablet by mouth every 8 (eight) hours as needed for moderate pain. Per pain management    . hyoscyamine (LEVSIN SL) 0.125 MG SL tablet Place 1 tablet (0.125 mg total) under the tongue every 6 (six) hours as needed. 90 tablet  3  . insulin aspart (NOVOLOG) 100 UNIT/ML injection Inject into the skin 3 (three) times daily with meals. 10+2 sliding scale per endocrinology    . Insulin Detemir (LEVEMIR FLEXPEN) 100 UNIT/ML Pen Inject 60 Units into the skin daily at 10 pm. 15 mL 11  . Insulin Pen Needle (BD PEN NEEDLE NANO U/F) 32G X 4 MM MISC 1 each by Does not apply route 3 (three) times daily. 100 each 11  . Lancet Devices (ACCU-CHEK SOFTCLIX) lancets Use as instructed 1 each 11  . lisinopril (PRINIVIL,ZESTRIL) 30 MG tablet Take 1 tablet (30 mg total) by mouth daily. 30 tablet 3  . LORazepam (ATIVAN) 0.5 MG tablet Take 1 tablet (0.5 mg total) by mouth 2 (two) times daily as needed for sleep. For sleep study 2  tablet 0  . pantoprazole (PROTONIX) 40 MG tablet Take 1 tablet (40 mg total) by mouth 2 (two) times daily. 60 tablet 4  . potassium chloride SA (K-DUR,KLOR-CON) 20 MEQ tablet Take 2 tablets (40 mEq total) by mouth 2 (two) times daily. (Patient not taking: Reported on 04/19/2015) 32 tablet 0  . predniSONE (DELTASONE) 10 MG tablet Take 1 tablet (10 mg total) by mouth daily with breakfast. 30 tablet 1  . ranitidine (ZANTAC) 150 MG capsule TAKE ONE CAPSULE BY MOUTH TWICE DAILY 60 capsule 0  . ranitidine (ZANTAC) 150 MG capsule Take 1 capsule (150 mg total) by mouth 2 (two) times daily. 60 capsule 3  . sildenafil (VIAGRA) 100 MG tablet Take 100 mg by mouth daily as needed for erectile dysfunction.     No facility-administered medications prior to visit.    ROS Review of Systems  Constitutional: Negative for fever, chills, fatigue and unexpected weight change.  HENT:       Dry mouth   Eyes: Negative for visual disturbance.  Respiratory: Negative for cough and shortness of breath.   Cardiovascular: Positive for chest pain. Negative for palpitations and leg swelling.  Gastrointestinal: Negative for nausea, vomiting, abdominal pain, diarrhea, constipation and blood in stool.  Endocrine: Negative for polydipsia, polyphagia and polyuria.  Musculoskeletal: Negative for myalgias, back pain, arthralgias, gait problem and neck pain.  Skin: Negative for rash.  Allergic/Immunologic: Negative for immunocompromised state.  Neurological: Positive for dizziness, weakness, numbness (hands ) and headaches.  Hematological: Negative for adenopathy. Does not bruise/bleed easily.  Psychiatric/Behavioral: Negative for suicidal ideas, sleep disturbance and dysphoric mood. The patient is not nervous/anxious.     Objective:  BP 144/88 mmHg  Pulse 93  Temp(Src) 98.7 F (37.1 C) (Oral)  Resp 16  Ht '5\' 6"'$  (1.676 m)  Wt 219 lb (99.338 kg)  BMI 35.36 kg/m2  SpO2 100%  BP/Weight 09/20/2015 08/14/2015 66/11/4032    Systolic BP 742 - 595  Diastolic BP 88 - 80  Wt. (Lbs) 219 220 221  BMI 35.36 35.53 35.69   Physical Exam  Constitutional: He appears well-developed and well-nourished. No distress.  HENT:  Head: Normocephalic and atraumatic.  Right Ear: Tympanic membrane, external ear and ear canal normal.  Left Ear: Tympanic membrane, external ear and ear canal normal.  Nose: Mucosal edema present.  Mouth/Throat: Oropharynx is clear and moist.  Neck: Normal range of motion. Neck supple.  Cardiovascular: Normal rate, regular rhythm, normal heart sounds and intact distal pulses.   Pulmonary/Chest: Effort normal and breath sounds normal.    Musculoskeletal: He exhibits no edema.  Neurological: He is alert. He has normal strength. No cranial nerve deficit or sensory deficit. He displays a  negative Romberg sign.  Skin: Skin is warm and dry. No rash noted. No erythema.  Psychiatric: He has a normal mood and affect.   Lab Results  Component Value Date   HGBA1C 12.10 09/20/2015   CBG 223  EKG: NSR, T wave inversion in anterior-lateral leads   Assessment & Plan:   Follow-up: No Follow-up on file.   Boykin Nearing MD

## 2015-09-20 NOTE — ED Notes (Signed)
Per EMS - pt coming from [facility]. Pt reports intermittent CP since yesterday; chronic issue of same. EKG done at facility showed inverted T waves in leads V4, V5, V6. Sent to [facility] for eval. VSS.

## 2015-09-20 NOTE — Discharge Instructions (Signed)
Nonspecific Chest Pain  Chest pain can be caused by many different conditions. There is always a chance that your pain could be related to something serious, such as a heart attack or a blood clot in your lungs. Chest pain can also be caused by conditions that are not life-threatening. If you have chest pain, it is very important to follow up with your health care provider. CAUSES  Chest pain can be caused by:  Heartburn.  Pneumonia or bronchitis.  Anxiety or stress.  Inflammation around your heart (pericarditis) or lung (pleuritis or pleurisy).  A blood clot in your lung.  A collapsed lung (pneumothorax). It can develop suddenly on its own (spontaneous pneumothorax) or from trauma to the chest.  Shingles infection (varicella-zoster virus).  Heart attack.  Damage to the bones, muscles, and cartilage that make up your chest wall. This can include:  Bruised bones due to injury.  Strained muscles or cartilage due to frequent or repeated coughing or overwork.  Fracture to one or more ribs.  Sore cartilage due to inflammation (costochondritis). RISK FACTORS  Risk factors for chest pain may include:  Activities that increase your risk for trauma or injury to your chest.  Respiratory infections or conditions that cause frequent coughing.  Medical conditions or overeating that can cause heartburn.  Heart disease or family history of heart disease.  Conditions or health behaviors that increase your risk of developing a blood clot.  Having had chicken pox (varicella zoster). SIGNS AND SYMPTOMS Chest pain can feel like:  Burning or tingling on the surface of your chest or deep in your chest.  Crushing, pressure, aching, or squeezing pain.  Dull or sharp pain that is worse when you move, cough, or take a deep breath.  Pain that is also felt in your back, neck, shoulder, or arm, or pain that spreads to any of these areas. Your chest pain may come and go, or it may stay  constant. DIAGNOSIS Lab tests or other studies may be needed to find the cause of your pain. Your health care provider may have you take a test called an ambulatory ECG (electrocardiogram). An ECG records your heartbeat patterns at the time the test is performed. You may also have other tests, such as:  Transthoracic echocardiogram (TTE). During echocardiography, sound waves are used to create a picture of all of the heart structures and to look at how blood flows through your heart.  Transesophageal echocardiogram (TEE).This is a more advanced imaging test that obtains images from inside your body. It allows your health care provider to see your heart in finer detail.  Cardiac monitoring. This allows your health care provider to monitor your heart rate and rhythm in real time.  Holter monitor. This is a portable device that records your heartbeat and can help to diagnose abnormal heartbeats. It allows your health care provider to track your heart activity for several days, if needed.  Stress tests. These can be done through exercise or by taking medicine that makes your heart beat more quickly.  Blood tests.  Imaging tests. TREATMENT  Your treatment depends on what is causing your chest pain. Treatment may include:  Medicines. These may include:  Acid blockers for heartburn.  Anti-inflammatory medicine.  Pain medicine for inflammatory conditions.  Antibiotic medicine, if an infection is present.  Medicines to dissolve blood clots.  Medicines to treat coronary artery disease.  Supportive care for conditions that do not require medicines. This may include:  Resting.  Applying heat  or cold packs to injured areas.  Limiting activities until pain decreases. HOME CARE INSTRUCTIONS  If you were prescribed an antibiotic medicine, finish it all even if you start to feel better.  Avoid any activities that bring on chest pain.  Do not use any tobacco products, including  cigarettes, chewing tobacco, or electronic cigarettes. If you need help quitting, ask your health care provider.  Do not drink alcohol.  Take medicines only as directed by your health care provider.  Keep all follow-up visits as directed by your health care provider. This is important. This includes any further testing if your chest pain does not go away.  If heartburn is the cause for your chest pain, you may be told to keep your head raised (elevated) while sleeping. This reduces the chance that acid will go from your stomach into your esophagus.  Make lifestyle changes as directed by your health care provider. These may include:  Getting regular exercise. Ask your health care provider to suggest some activities that are safe for you.  Eating a heart-healthy diet. A registered dietitian can help you to learn healthy eating options.  Maintaining a healthy weight.  Managing diabetes, if necessary.  Reducing stress. SEEK MEDICAL CARE IF:  Your chest pain does not go away after treatment.  You have a rash with blisters on your chest.  You have a fever. SEEK IMMEDIATE MEDICAL CARE IF:   Your chest pain is worse.  You have an increasing cough, or you cough up blood.  You have severe abdominal pain.  You have severe weakness.  You faint.  You have chills.  You have sudden, unexplained chest discomfort.  You have sudden, unexplained discomfort in your arms, back, neck, or jaw.  You have shortness of breath at any time.  You suddenly start to sweat, or your skin gets clammy.  You feel nauseous or you vomit.  You suddenly feel light-headed or dizzy.  Your heart begins to beat quickly, or it feels like it is skipping beats. These symptoms may represent a serious problem that is an emergency. Do not wait to see if the symptoms will go away. Get medical help right away. Call your local emergency services (911 in the [place] Do not drive yourself to the hospital.   This  information is not intended to replace advice given to you by your health care provider. Make sure you discuss any questions you have with your health care provider.   Document Released: 05/07/2005 Document Revised: 08/18/2014 Document Reviewed: 03/03/2014 Elsevier Interactive Patient Education 2016 [place] Headache Without Cause A headache is pain or discomfort felt around the head or neck area. The specific cause of a headache may not be found. There are many causes and types of headaches. A few common ones are:  Tension headaches.  Migraine headaches.  Cluster headaches.  Chronic daily headaches. HOME CARE INSTRUCTIONS  Watch your condition for any changes. Take these steps to help with your condition: Managing Pain  Take over-the-counter and prescription medicines only as told by your health care provider.  Lie down in a dark, quiet room when you have a headache.  If directed, apply ice to the head and neck area:  Put ice in a plastic bag.  Place a towel between your skin and the bag.  Leave the ice on for 20 minutes, 2-3 times per day.  Use a heating pad or hot shower to apply heat to the head and neck area as told by  your health care provider.  Keep lights dim if bright lights bother you or make your headaches worse. Eating and Drinking  Eat meals on a regular schedule.  Limit alcohol use.  Decrease the amount of caffeine you drink, or stop drinking caffeine. General Instructions  Keep all follow-up visits as told by your health care provider. This is important.  Keep a headache journal to help find out what may trigger your headaches. For example, write down:  What you eat and drink.  How much sleep you get.  Any change to your diet or medicines.  Try massage or other relaxation techniques.  Limit stress.  Sit up straight, and do not tense your muscles.  Do not use tobacco products, including cigarettes, chewing tobacco, or e-cigarettes.  If you need help quitting, ask your health care provider.  Exercise regularly as told by your health care provider.  Sleep on a regular schedule. Get 7-9 hours of sleep, or the amount recommended by your health care provider. SEEK MEDICAL CARE IF:   Your symptoms are not helped by medicine.  You have a headache that is different from the usual headache.  You have nausea or you vomit.  You have a fever. SEEK IMMEDIATE MEDICAL CARE IF:   Your headache becomes severe.  You have repeated vomiting.  You have a stiff neck.  You have a loss of vision.  You have problems with speech.  You have pain in the eye or ear.  You have muscular weakness or loss of muscle control.  You lose your balance or have trouble walking.  You feel faint or pass out.  You have confusion.   This information is not intended to replace advice given to you by your health care provider. Make sure you discuss any questions you have with your health care provider.   Document Released: 07/28/2005 Document Revised: 04/18/2015 Document Reviewed: 11/20/2014 Elsevier Interactive Patient Education [employer].

## 2015-09-20 NOTE — Patient Instructions (Addendum)
Darcey was seen today for headache and chest pain.  Diagnoses and all orders for this visit:  Type 2 diabetes mellitus without complication, without long-term current use of insulin (HCC) -     HgB A1c -     Glucose (CBG)  Chest pain, unspecified chest pain type -     Cancel: DG Chest 2 View; Future -     Cancel: D-dimer, quantitative (not at [address]) -     Cancel: Troponin I  New onset of headaches after age 60 -     CT Head Wo Contrast; Future -     amitriptyline (ELAVIL) 50 MG tablet; Take 1 tablet (50 mg total) by mouth at bedtime.   Please go to ED for chest pain You will be called with CT head to evaluate headaches as well as follow up cardiology appt   F/u with me in 4 weeks for headache   Dr. Adrian Blackwater

## 2015-09-20 NOTE — ED Notes (Signed)
Pt transported to xray 

## 2015-09-20 NOTE — Progress Notes (Signed)
Patient is being transferred to the ED for further evaluation Spoke with Melissa-charge nurse in the ED

## 2015-09-20 NOTE — Assessment & Plan Note (Signed)
A: chest pain with multiple risk factors for ACS P: Non emergent transfer to  ED to rule out acute chest  Patient to be re-evaluated by cardiology for stress test

## 2015-09-20 NOTE — Progress Notes (Signed)
C/C chest pain, pressure x 1 -2 week on and off Pain worsen today  HA and pressure  Pain scale #10

## 2015-09-20 NOTE — ED Notes (Signed)
Pt verbalized understanding of d/c instruction and follow-up care. No further questions/concerns, VSS, ambulatory w/ steady gait (refused wheelchair)

## 2015-09-20 NOTE — ED Provider Notes (Signed)
CSN: [id number]     Arrival date & time 09/20/15  1115 History   First MD Initiated Contact with Patient 09/20/15 1120     Chief Complaint  Patient presents with  . Chest Pain   Marco [name] is a 60 y.o. male who presents to the emergency department complaining of left-sided and substernal chest pain and pressure ongoing for the past week. He reports he has some chronic chest pain but this has worsened over the past week. He currently complains of a 10 out of 10 substernal chest pain that is worse with touching and certain movements. He also reports feeling short of breath and pain with deep inspiration. He was sent from his primary care provider's office after EKG showed inverted T waves in lead V4, V5 and V6. Patient is also complaining of intermittent sharp headache for the past week. He reports his pain is intermittent and sharp. He reports an 8 out of 10 frontal headache currently. The patient is followed by cardiologist [name]. He has a history of diabetes, hypertension and nonischemic cardiovascular disease. He had a history of paroxysmal atrial fibrillation is status post ablation. He denies taking any anticoagulants. The patient denies fevers, leg pain, leg swelling, hemoptysis, coughing, wheezing, numbness, tingling, weakness, syncope, changes to his vision, double vision, neck stiffness, vomiting, diarrhea or rashes.   Patient is a 60 y.o. male presenting with chest pain. The history is provided by the patient and medical records. No language interpreter was used.  Chest Pain Associated symptoms: headache and shortness of breath   Associated symptoms: no abdominal pain, no back pain, no cough, no dizziness, no fever, no nausea, no numbness, no palpitations, not vomiting and no weakness     Past Medical History  Diagnosis Date  . Non-obstructive CAD     a. 02/2013 Cath ([place]): nonobs dzs;  b. 09/2014 Myoview ([place]): EF 55%, no ischemia;  c. 12/2014 Echo: EF 50-55%, gr1 DD, no effusion.  .  Essential hypertension   . Diabetes mellitus without complication ([place])   . Rheumatoid arthritis ([place])   . Chronic pain   . PAF (paroxysmal atrial fibrillation) ([place])     a. 02/2013 s/p rfca in [place], [place]-->prev on Xarelto, d/c'd 2/2 anemia, ? GIB.  [name] Benign colon polyp 08/01/2013    [facility] in [place]. large base tranverse colon polyp was biopsied. polyp was benign with minimal surface hyperplastic change.  . Morbid obesity ([place])   . Gastritis 07/29/2013    confirmed on EGD, bx done an negative for intestinal metaplasia, dsyplasia or H. pylori. normal gastric emptying study done 07/13/2013.  [name] Esophageal hiatal hernia 07/29/2013    confirmed on EGD   . Sigmoid diverticulosis 08/01/2013    confirmed on colonscopy. record scanned into chart  . GERD (gastroesophageal reflux disease)   . Esophageal stricture   . Diverticulosis    Past Surgical History  Procedure Laterality Date  . Colon surgery  09/2014    colon resection   . Cardiac catheterization  05/2014    ablation for atrial fibrillation  . Colon resection  09/2013    due to large, abnormal polpy. non cancerous per patient.    Family History  Problem Relation Age of Onset  . Hypertension Mother   . Diabetes Mother   . Cancer Mother   . COPD Mother   . Hypertension Father   . Diabetes Father   . Heart Problems Father   . COPD Father  Social History  Substance Use Topics  . Smoking status: Never Smoker   . Smokeless tobacco: None  . Alcohol Use: No    Review of Systems  Constitutional: Negative for fever and chills.  HENT: Negative for congestion and sore throat.   Eyes: Negative for visual disturbance.  Respiratory: Positive for shortness of breath. Negative for cough, chest tightness and wheezing.   Cardiovascular: Positive for chest pain. Negative for palpitations and leg swelling.  Gastrointestinal: Negative for nausea, vomiting, abdominal pain and diarrhea.  Genitourinary: Negative for  dysuria.  Musculoskeletal: Negative for back pain and neck pain.  Skin: Negative for rash.  Neurological: Positive for headaches. Negative for dizziness, syncope, speech difficulty, weakness, light-headedness and numbness.      Allergies  Review of patient's allergies indicates no known allergies.  Home Medications   Prior to Admission medications   Medication Sig Start Date End Date Taking? Authorizing Provider  allopurinol (ZYLOPRIM) 100 MG tablet Take 1 tablet (100 mg total) by mouth daily. 08/15/15  Yes Arnoldo Morale, MD  amitriptyline (ELAVIL) 50 MG tablet Take 1 tablet (50 mg total) by mouth at bedtime. 09/20/15  Yes Boykin Nearing, MD  aspirin EC 81 MG tablet Take 1 tablet (81 mg total) by mouth daily. 03/30/15  Yes Tiffany Daneil Dan, [name]  atorvastatin (LIPITOR) 40 MG tablet TAKE ONE TABLET BY MOUTH ONCE DAILY 08/07/15  Yes Josalyn Funches, MD  carvedilol (COREG) 12.5 MG tablet TAKE ONE TABLET BY MOUTH TWICE DAILY 08/07/15  Yes Josalyn Funches, MD  diltiazem (DILACOR XR) 180 MG 24 hr capsule TAKE ONE CAPSULE BY MOUTH ONCE DAILY 08/07/15  Yes Josalyn Funches, MD  furosemide (LASIX) 40 MG tablet TAKE ONE TABLET BY MOUTH ONCE DAILY 09/04/15  Yes Josalyn Funches, MD  gabapentin (NEURONTIN) 300 MG capsule Take 2 capsules (600 mg total) by mouth 3 (three) times daily. 04/13/15  Yes Josalyn Funches, MD  HYDROcodone-acetaminophen (NORCO/VICODIN) 5-325 MG tablet Take 1 tablet by mouth every 8 (eight) hours as needed for moderate pain. Per pain management   Yes Historical Provider, MD  insulin aspart (NOVOLOG) 100 UNIT/ML injection Inject 14-24 Units into the skin 3 (three) times daily with meals. Reported on 09/20/2015   Yes Historical Provider, MD  Insulin Detemir (LEVEMIR FLEXPEN) 100 UNIT/ML Pen Inject 60 Units into the skin daily at 10 pm. 02/23/15  Yes Josalyn Funches, MD  lisinopril (PRINIVIL,ZESTRIL) 30 MG tablet Take 1 tablet (30 mg total) by mouth daily. 08/01/15  Yes Josalyn Funches, MD   pantoprazole (PROTONIX) 40 MG tablet Take 1 tablet (40 mg total) by mouth 2 (two) times daily. 03/16/15  Yes Lori P Hvozdovic, [name]  predniSONE (DELTASONE) 10 MG tablet Take 1 tablet (10 mg total) by mouth daily with breakfast. 08/08/15  Yes Josalyn Funches, MD  ranitidine (ZANTAC) 150 MG capsule TAKE ONE CAPSULE BY MOUTH TWICE DAILY 08/02/15  Yes Lori P Hvozdovic, [name]  ACCU-CHEK FASTCLIX LANCETS MISC 1 each by Other route 3 (three) times daily. Patient not taking: Reported on 09/20/2015 06/19/15   Boykin Nearing, MD  Blood Glucose Monitoring Suppl (ACCU-CHEK AVIVA PLUS) W/DEVICE KIT 1 Device by Does not apply route 3 (three) times daily after meals. E11.9 Patient not taking: Reported on 09/20/2015 06/14/15   Boykin Nearing, MD  Blood Glucose Monitoring Suppl (ACCU-CHEK AVIVA PLUS) W/DEVICE KIT Used as directed Patient not taking: Reported on 09/20/2015 06/14/15   Josalyn Funches, MD  capsicum (ZOSTRIX) 0.075 % topical cream Apply 1 application topically 2 (two) times daily. 09/03/15  Landis Martins, DPM  cyclobenzaprine (FLEXERIL) 10 MG tablet Take 10 mg by mouth 2 (two) times daily as needed for muscle spasms.    Historical Provider, MD  glucose blood (ACCU-CHEK AVIVA PLUS) test strip 1 each by Other route 3 (three) times daily. E11.9 Patient not taking: Reported on 09/20/2015 06/14/15   Boykin Nearing, MD  glucose blood test strip Use as instructed 06/12/15   Boykin Nearing, MD  glucose blood test strip Use as instructed Patient not taking: Reported on 09/20/2015 06/14/15   Boykin Nearing, MD  hyoscyamine (LEVSIN SL) 0.125 MG SL tablet Place 1 tablet (0.125 mg total) under the tongue every 6 (six) hours as needed. Patient not taking: Reported on 09/20/2015 03/16/15   Lori P Hvozdovic, [name]  Insulin Pen Needle (BD PEN NEEDLE NANO U/F) 32G X 4 MM MISC 1 each by Does not apply route 3 (three) times daily. Patient not taking: Reported on 09/20/2015 08/08/15   Boykin Nearing, MD  Lancet Devices [facility]) lancets Use as instructed Patient not taking: Reported on 09/20/2015 08/03/15   Boykin Nearing, MD  LORazepam (ATIVAN) 0.5 MG tablet Take 1 tablet (0.5 mg total) by mouth 2 (two) times daily as needed for sleep. For sleep study Patient not taking: Reported on 09/20/2015 06/14/15   Boykin Nearing, MD  sildenafil (VIAGRA) 100 MG tablet Take 100 mg by mouth daily as needed for erectile dysfunction. Reported on 09/20/2015    Historical Provider, MD   BP 141/85 mmHg  Pulse 46  Temp(Src) 97.1 F (36.2 C) (Oral)  Resp 14  SpO2 99% Physical Exam  Constitutional: He is oriented to person, place, and time. He appears well-developed and well-nourished. No distress.  Nontoxic appearing.  HENT:  Head: Normocephalic and atraumatic.  Mouth/Throat: Oropharynx is clear and moist.  Eyes: Conjunctivae and EOM are normal. Pupils are equal, round, and reactive to light. Right eye exhibits no discharge. Left eye exhibits no discharge.  Neck: Neck supple. No JVD present. No tracheal deviation present.  Cardiovascular: Normal rate, regular rhythm, normal heart sounds and intact distal pulses.  Exam reveals no gallop and no friction rub.   No murmur heard. Bilateral radial and posterior tibialis pulses are intact.  Pulmonary/Chest: Effort normal and breath sounds normal. No respiratory distress. He has no wheezes. He has no rales. He exhibits tenderness.  Lungs are clear to auscultation bilaterally. Patient has substernal chest wall tenderness to palpation which reproduces his chest pain.  Abdominal: Soft. Bowel sounds are normal. He exhibits no distension. There is no tenderness. There is no guarding.  Musculoskeletal: Normal range of motion. He exhibits no edema or tenderness.  No lower extremity edema or tenderness.  Lymphadenopathy:    He has no cervical adenopathy.  Neurological: He is alert and oriented to person, place, and time. No cranial nerve deficit. Coordination normal.  The patient is alert  and oriented 3. Cranial nerves are intact. Sensation is intact in his bilateral upper and lower extremities. No pronator drift. Finger to nose intact bilaterally. Speech is clear and coherent. EOMs are intact.  Skin: Skin is warm and dry. No rash noted. He is not diaphoretic. No erythema. No pallor.  Psychiatric: He has a normal mood and affect. His behavior is normal.  Nursing note and vitals reviewed.   ED Course  Procedures (including critical care time) Labs Review Labs Reviewed  BASIC METABOLIC PANEL - Abnormal; Notable for the following:    Potassium 3.2 (*)    Chloride 100 (*)  Glucose, Bld 225 (*)    All other components within normal limits  CBC WITH DIFFERENTIAL/PLATELET - Abnormal; Notable for the following:    Monocytes Absolute 1.1 (*)    All other components within normal limits  BRAIN NATRIURETIC PEPTIDE  I-STAT TROPOININ, ED  Randolm Idol, ED    Imaging Review Dg Chest 2 View  09/20/2015  CLINICAL DATA:  Left-sided chest pain for 1 day EXAM: CHEST  2 VIEW COMPARISON:  April 14, 2015 FINDINGS: There is no edema or consolidation. There is slight scarring in the lingula. Heart size and pulmonary vascularity are within normal limits. No adenopathy. There is degenerative change in the thoracic spine. IMPRESSION: Slight scarring in the lingula.  No edema or consolidation. Electronically Signed   By: Lowella Grip III M.D.   On: 09/20/2015 11:54   I have personally reviewed and evaluated these images and lab results as part of my medical decision-making.   EKG Interpretation   Date/Time:  Thursday September 20 2015 11:27:04 EST Ventricular Rate:  65 PR Interval:  133 QRS Duration: 97 QT Interval:  463 QTC Calculation: 481 R Axis:     Text Interpretation:  Sinus rhythm Abnormal R-wave progression, early  transition LVH with secondary repolarization abnormality Borderline  prolonged QT interval no acute ischemia T waves similar to prior.  No  significant  change since last tracing Confirmed by [facility], COURTNEY  [phone]) on 09/20/2015 1:09:43 PM      Filed Vitals:   09/20/15 1430 09/20/15 1500 09/20/15 1530 09/20/15 1600  BP: 144/81 136/76 152/82 141/85  Pulse: 68 66 63 46  Temp:      TempSrc:      Resp: '15 17 13 14  '$ SpO2: 93% 96% 94% 99%     MDM   Meds given in ED:  Medications  aspirin chewable tablet 324 mg (324 mg Oral Given 09/20/15 1218)  acetaminophen (TYLENOL) tablet 650 mg (650 mg Oral Given 09/20/15 1353)    Discharge Medication List as of 09/20/2015  4:32 PM      Final diagnoses:  Chest pain, unspecified chest pain type  Bad headache   This is a 60 y.o. male who presents to the emergency department complaining of left-sided and substernal chest pain and pressure ongoing for the past week. He reports he has some chronic chest pain but this has worsened over the past week. He currently complains of a 10 out of 10 substernal chest pain that is worse with touching and certain movements. He also reports feeling short of breath and pain with deep inspiration. He was sent from his primary care provider's office after EKG showed inverted T waves in lead V4, V5 and V6. Patient is also complaining of intermittent sharp headache for the past week. He reports his pain is intermittent and sharp. He reports an 8 out of 10 frontal headache currently. The patient is followed by cardiologist [name]. He has a history of diabetes, hypertension and nonischemic cardiovascular disease. On exam the patient is afebrile nontoxic appearing. His lungs are clear to auscultation bilaterally. He has substernal chest wall tenderness to palpation which reproduces his chest pain. No murmurs, rubs or gallops noted. No JVD. He has no focal neurological deficits. He has normal gait. His EKG shows no change from his last tracing. No STEMI. The inverted T waves noted by his primary care provider were present in his EKG from September 2016. Patient's initial troponin  is within normal limits. BMP is remarkable only for a  glucose of 225. CBC is unremarkable. BNP is 33. Chest x-ray shows no edema or consolidation. Will check delta troponin. I have low suspicion for ACS at this time as the patient has had this chest pain for over a week. Patient also has chronic chest pain. He also has no new changes on his EKG. I also have low suspicion for PE. The patient is not tachypenic, tachycardic or hypoxic. Patient's delta troponin is also within normal limits. I spoke with [facility] who will contact the patient to make an appointment for follow-up with his cardiologist for the beginning of next week.  At reevaluation patient reports he is feeling back at his baseline. He feels comfortable with discharge. He will happily follow up with cardiology when they call him to make the appointment. I discussed strict and specific return precautions. I advised the patient to follow-up with their primary care provider this week. I advised the patient to return to the emergency department with new or worsening symptoms or new concerns. The patient verbalized understanding and agreement with plan.    This patient was discussed with Dr. Thomasene Lot who agrees with assessment and plan.    Waynetta Pean, [name] 09/20/15 1657  Courteney Julio Alm, MD 09/21/15 0730

## 2015-09-20 NOTE — Assessment & Plan Note (Signed)
A; new onset headaches, I suspect cluster headache vs tension P: TCA  CT head since this is new to rule out mass, stroke, chronic sinusitis

## 2015-09-29 ENCOUNTER — Other Ambulatory Visit: Payer: Self-pay | Admitting: Physician Assistant

## 2015-09-29 ENCOUNTER — Other Ambulatory Visit: Payer: Self-pay | Admitting: Family Medicine

## 2015-10-02 ENCOUNTER — Encounter: Payer: Self-pay | Admitting: Cardiology

## 2015-10-02 ENCOUNTER — Ambulatory Visit (INDEPENDENT_AMBULATORY_CARE_PROVIDER_SITE_OTHER): Payer: Medicaid Other | Admitting: Cardiology

## 2015-10-02 VITALS — BP 116/76 | HR 74 | Ht 66.0 in | Wt 223.0 lb

## 2015-10-02 DIAGNOSIS — R079 Chest pain, unspecified: Secondary | ICD-10-CM | POA: Diagnosis not present

## 2015-10-02 DIAGNOSIS — G8929 Other chronic pain: Secondary | ICD-10-CM

## 2015-10-02 DIAGNOSIS — E119 Type 2 diabetes mellitus without complications: Secondary | ICD-10-CM

## 2015-10-02 DIAGNOSIS — I480 Paroxysmal atrial fibrillation: Secondary | ICD-10-CM | POA: Diagnosis not present

## 2015-10-02 DIAGNOSIS — I2510 Atherosclerotic heart disease of native coronary artery without angina pectoris: Secondary | ICD-10-CM | POA: Diagnosis not present

## 2015-10-02 NOTE — Progress Notes (Signed)
Cardiology Office Note   Date:  10/02/2015   ID:  Marco [name], DOB April 17, 1956, MRN [id number]  PCP:  Minerva Ends, MD  Cardiologist:  [name]  Chief Complaint  Patient presents with  . Chest Pain    new  . Hospitalization Follow-up      History of Present Illness: Marco [name] is a 60 y.o. male who presents for post ER visit for chest pain.  Troponin 0.06 and 0.05. Was hypokalemic at 3.2.  BNP 33, hgb A1C 12.1  CXR without acute process. No EKG changes in ER.  He has a history of chest pain dating back to 2014 at which time he underwent diagnostic catheterization in [place] revealing normal coronary arteries. During the same admission, he apparently had atrial fibrillation subsequent underwent A. fib ablation. He says that following ablation, he was on Xarelto for some period of time but this was subsequently discontinued secondary to anemia with subsequent finding of a large colon polyp and also gastric ulcer. He then underwent partial colectomy. He says he remained anemic following colectomy and at some point was followed by hematology. Since 2014, he has had intermittent chest discomfort and has also been treated for rheumatoid arthritis with diffuse body aches and tenderness. He says he has been on multiple rheumatologic medications without any significant improvement in his symptoms. Regarding chest pain, he had a low risk stress test in February 2016. Now lives in [place]. He was admitted to [facility] in May with chest discomfort and a mild but flat troponin elevation. CT angios of the chest was negative for PE. Echo showed normal LV function with grade 1 diastolic dysfunction and no evidence of effusion. Chest pain was felt to be potentially secondary to pericarditis. MRI was recommended however patient cannot feel that he would be able to lie in the MRI machine related to claustrophobia. He was empirically placed on colchicine.  Today he reports freq  episodes of chest pain associated with SOB.  His RA has improved with his MD.  The pain has been increasing and he does have some DOE.  His glucose has been uncontrolled and is seeing endocrinologist.        Past Medical History  Diagnosis Date  . Non-obstructive CAD     a. 02/2013 Cath ([place]): nonobs dzs;  b. 09/2014 Myoview ([place]): EF 55%, no ischemia;  c. 12/2014 Echo: EF 50-55%, gr1 DD, no effusion.  . Essential hypertension   . Diabetes mellitus without complication ([place])   . Rheumatoid arthritis ([place])   . Chronic pain   . PAF (paroxysmal atrial fibrillation) ([place])     a. 02/2013 s/p rfca in [place], [place]-->prev on Xarelto, d/c'd 2/2 anemia, ? GIB.  [name] Benign colon polyp 08/01/2013    [facility] in [place]. large base tranverse colon polyp was biopsied. polyp was benign with minimal surface hyperplastic change.  . Morbid obesity ([place])   . Gastritis 07/29/2013    confirmed on EGD, bx done an negative for intestinal metaplasia, dsyplasia or H. pylori. normal gastric emptying study done 07/13/2013.  [name] Esophageal hiatal hernia 07/29/2013    confirmed on EGD   . Sigmoid diverticulosis 08/01/2013    confirmed on colonscopy. record scanned into chart  . GERD (gastroesophageal reflux disease)   . Esophageal stricture   . Diverticulosis     Past Surgical History  Procedure Laterality Date  . Colon surgery  09/2014    colon resection   .  Cardiac catheterization  05/2014    ablation for atrial fibrillation  . Colon resection  09/2013    due to large, abnormal polpy. non cancerous per patient.      Current Outpatient Prescriptions  Medication Sig Dispense Refill  . ACCU-CHEK FASTCLIX LANCETS MISC 1 each by Other route 3 (three) times daily. 102 each 11  . allopurinol (ZYLOPRIM) 100 MG tablet Take 1 tablet (100 mg total) by mouth daily. 30 tablet 1  . ASPIRIN LOW DOSE 81 MG EC tablet TAKE ONE TABLET BY MOUTH ONCE DAILY 90 tablet 0  . atorvastatin (LIPITOR) 40 MG tablet TAKE  ONE TABLET BY MOUTH ONCE DAILY 90 tablet 1  . Blood Glucose Monitoring Suppl (ACCU-CHEK AVIVA PLUS) W/DEVICE KIT 1 Device by Does not apply route 3 (three) times daily after meals. E11.9 1 kit 0  . Blood Glucose Monitoring Suppl (ACCU-CHEK AVIVA PLUS) W/DEVICE KIT Used as directed 1 kit 0  . capsicum (ZOSTRIX) 0.075 % topical cream Apply 1 application topically 2 (two) times daily. 28.3 g 0  . carvedilol (COREG) 12.5 MG tablet TAKE ONE TABLET BY MOUTH TWICE DAILY 60 tablet 2  . diltiazem (DILACOR XR) 180 MG 24 hr capsule TAKE ONE CAPSULE BY MOUTH ONCE DAILY 30 capsule 3  . furosemide (LASIX) 40 MG tablet TAKE ONE TABLET BY MOUTH ONCE DAILY 30 tablet 0  . gabapentin (NEURONTIN) 300 MG capsule Take 2 capsules (600 mg total) by mouth 3 (three) times daily. 180 capsule 5  . glucose blood (ACCU-CHEK AVIVA PLUS) test strip 1 each by Other route 3 (three) times daily. E11.9 100 each 12  . glucose blood test strip Use as instructed 100 each 12  . glucose blood test strip Use as instructed 100 each 12  . HYDROcodone-acetaminophen (NORCO/VICODIN) 5-325 MG tablet Take 1 tablet by mouth every 8 (eight) hours as needed for moderate pain. Per pain management    . insulin aspart (NOVOLOG) 100 UNIT/ML injection Inject 14-24 Units into the skin 3 (three) times daily with meals. Reported on 09/20/2015    . Insulin Detemir (LEVEMIR FLEXPEN) 100 UNIT/ML Pen Inject 60 Units into the skin daily at 10 pm. 15 mL 11  . Insulin Pen Needle (BD PEN NEEDLE NANO U/F) 32G X 4 MM MISC 1 each by Does not apply route 3 (three) times daily. 100 each 11  . Lancet Devices (ACCU-CHEK SOFTCLIX) lancets Use as instructed 1 each 11  . lisinopril (PRINIVIL,ZESTRIL) 30 MG tablet Take 1 tablet (30 mg total) by mouth daily. 30 tablet 3  . pantoprazole (PROTONIX) 40 MG tablet Take 1 tablet (40 mg total) by mouth 2 (two) times daily. 60 tablet 4  . predniSONE (DELTASONE) 10 MG tablet Take 1 tablet (10 mg total) by mouth daily with breakfast. 30  tablet 1  . ranitidine (ZANTAC) 150 MG capsule TAKE ONE CAPSULE BY MOUTH TWICE DAILY 60 capsule 0   No current facility-administered medications for this visit.    Allergies:   Review of patient's allergies indicates no known allergies.    Social History:  The patient  reports that he has never smoked. He does not have any smokeless tobacco history on file. He reports that he does not drink alcohol or use illicit drugs.   Family History:  The patient's family history includes COPD in his father and mother; Cancer in his mother; Diabetes in his father and mother; Heart Problems in his father; Hypertension in his father and mother.    ROS:  General:no  colds or fevers, some wt changes on different scales Skin:no rashes or ulcers HEENT:no blurred vision, no congestion CV:see HPI PUL:see HPI GI:no diarrhea constipation or melena, no indigestion GU:no hematuria, no dysuria MS:no joint pain, no claudication Neuro:no syncope, no lightheadedness Endo:poorly controlled diabetes usually runs >200, no thyroid disease  Wt Readings from Last 3 Encounters:  10/02/15 223 lb (101.152 kg)  09/20/15 219 lb (99.338 kg)  08/14/15 220 lb (99.791 kg)     PHYSICAL EXAM: VS:  BP 116/76 mmHg  Pulse 74  Ht _0  (1.676 m)  Wt 223 lb (101.152 kg)  BMI 36.01 kg/m2  SpO2 97% , BMI Body mass index is 36.01 kg/(m^2). General:Pleasant affect, NAD Skin:Warm and dry, brisk capillary refill HEENT:normocephalic, sclera clear, mucus membranes moist Neck:supple, no JVD, no bruits  Heart:S1S2 RRR without murmur, gallup, rub or click Lungs:clear without rales, rhonchi, or wheezes ZTI:WPYK, non tender, + BS, do not palpate liver spleen or masses Ext:tr lower ext edema, 2+ pedal pulses, 2+ radial pulses Neuro:alert and oriented X 3, MAE, follows commands, + facial symmetry    EKG:  EKG is ordered today. The ekg ordered today demonstrates SR with t wave inversion inf lat at times present on old EKGs at times.  not   Recent Labs: 12/23/2014: TSH 0.757 03/16/2015: ALT 28 09/20/2015: B Natriuretic Peptide 33.3; BUN 10; Creatinine, Ser 1.10; Hemoglobin 13.6; Platelets 233; Potassium 3.2*; Sodium 142    Lipid Panel    Component Value Date/Time   CHOL 127 12/23/2014 2149   TRIG 192* 12/23/2014 2149   HDL 34* 12/23/2014 2149   CHOLHDL 3.7 12/23/2014 2149   VLDL 38 12/23/2014 2149   [place] 55 12/23/2014 2149       Other studies Reviewed: Additional studies/ records that were reviewed today include: previous notes.   ASSESSMENT AND PLAN:  1.  Chest pain associated with SOB with poorly controlled diabetic.  Last nuc study was a year ago but with elevated risk for CAD will proceed with lexiscan myoview.  Pt cannot walk on treadmill due leg pain.  2. PAF no further episodes noted  3. Uncontrolled DM  4. Chronic pain he is going to pain clinic  5. RA followed by rheumatology    Current medicines are reviewed with the patient today.  The patient Has no concerns regarding medicines.  The following changes have been made:  See above Labs/ tests ordered today include:see above  Disposition:   FU:  see above  Lennie Muckle, NP  10/02/2015 10:59 AM    [facility] [place], [place], [address] [place], [place] Phone: [phone]; Fax: [phone]

## 2015-10-02 NOTE — Patient Instructions (Addendum)
Medication Instructions:  Your physician recommends that you continue on your current medications as directed. Please refer to the Current Medication list given to you today.   Labwork: None ordered  Testing/Procedures: Your physician has requested that you have a lexiscan myoview. For further information please visit [email or web address]. Please follow instruction sheet, as given.   Follow-Up: Your physician recommends that you schedule a follow-up appointment 4-6 WEEKS WITH [name]    Any Other Special Instructions Will Be Listed Below (If Applicable). Pharmacologic Stress Electrocardiogram A pharmacologic stress electrocardiogram is a heart (cardiac) test that uses nuclear imaging to evaluate the blood supply to your heart. This test may also be called a pharmacologic stress electrocardiography. Pharmacologic means that a medicine is used to increase your heart rate and blood pressure.  This stress test is done to find areas of poor blood flow to the heart by determining the extent of coronary artery disease (CAD). Some people exercise on a treadmill, which naturally increases the blood flow to the heart. For those people unable to exercise on a treadmill, a medicine is used. This medicine stimulates your heart and will cause your heart to beat harder and more quickly, as if you were exercising.  Pharmacologic stress tests can help determine:  The adequacy of blood flow to your heart during increased levels of activity in order to clear you for discharge home.  The extent of coronary artery blockage caused by CAD.  Your prognosis if you have suffered a heart attack.  The effectiveness of cardiac procedures done, such as an angioplasty, which can increase the circulation in your coronary arteries.  Causes of chest pain or pressure. LET [facility] CARE PROVIDER KNOW ABOUT:  Any allergies you have.  All medicines you are taking, including vitamins, herbs, eye drops, creams, and  over-the-counter medicines.  Previous problems you or members of your family have had with the use of anesthetics.  Any blood disorders you have.  Previous surgeries you have had.  Medical conditions you have.  Possibility of pregnancy, if this applies.  If you are currently breastfeeding. RISKS AND COMPLICATIONS Generally, this is a safe procedure. However, as with any procedure, complications can occur. Possible complications include:  You develop pain or pressure in the following areas:  Chest.  Jaw or neck.  Between your shoulder blades.  Radiating down your left arm.  Headache.  Dizziness or light-headedness.  Shortness of breath.  Increased or irregular heartbeat.  Low blood pressure.  Nausea or vomiting.  Flushing.  Redness going up the arm and slight pain during injection of medicine.  Heart attack (rare). BEFORE THE PROCEDURE   Avoid all forms of caffeine for 24 hours before your test or as directed by your health care provider. This includes coffee, tea (even decaffeinated tea), caffeinated sodas, chocolate, cocoa, and certain pain medicines.  Follow your health care provider's instructions regarding eating and drinking before the test.  Take your medicines as directed at regular times with water unless instructed otherwise. Exceptions may include:  If you have diabetes, ask how you are to take your insulin or pills. It is common to adjust insulin dosing the morning of the test.  If you are taking beta-blocker medicines, it is important to talk to your health care provider about these medicines well before the date of your test. Taking beta-blocker medicines may interfere with the test. In some cases, these medicines need to be changed or stopped 24 hours or more before the test.  If you  wear a nitroglycerin patch, it may need to be removed prior to the test. Ask your health care provider if the patch should be removed before the test.  If you use an  inhaler for any breathing condition, bring it with you to the test.  If you are an outpatient, bring a snack so you can eat right after the stress phase of the test.  Do not smoke for 4 hours prior to the test or as directed by your health care provider.  Do not apply lotions, powders, creams, or oils on your chest prior to the test.  Wear comfortable shoes and clothing. Let your health care provider know if you were unable to complete or follow the preparations for your test. PROCEDURE   Multiple patches (electrodes) will be put on your chest. If needed, small areas of your chest may be shaved to get better contact with the electrodes. Once the electrodes are attached to your body, multiple wires will be attached to the electrodes, and your heart rate will be monitored.  An IV access will be started. A nuclear trace (isotope) is given. The isotope may be given intravenously, or it may be swallowed. Nuclear refers to several types of radioactive isotopes, and the nuclear isotope lights up the arteries so that the nuclear images are clear. The isotope is absorbed by your body. This results in low radiation exposure.  A resting nuclear image is taken to show how your heart functions at rest.  A medicine is given through the IV access.  A second scan is done about 1 hour after the medicine injection and determines how your heart functions under stress.  During this stress phase, you will be connected to an electrocardiogram machine. Your blood pressure and oxygen levels will be monitored. AFTER THE PROCEDURE   Your heart rate and blood pressure will be monitored after the test.  You may return to your normal schedule, including diet,activities, and medicines, unless your health care provider tells you otherwise.   This information is not intended to replace advice given to you by your health care provider. Make sure you discuss any questions you have with your health care provider.    Document Released: 12/14/2008 Document Revised: 08/02/2013 Document Reviewed: 04/04/2013 Elsevier Interactive Patient Education [employer].     If you need a refill on your cardiac medications before your next appointment, please call your pharmacy.

## 2015-10-04 DIAGNOSIS — E119 Type 2 diabetes mellitus without complications: Secondary | ICD-10-CM | POA: Insufficient documentation

## 2015-10-04 DIAGNOSIS — IMO0002 Reserved for concepts with insufficient information to code with codable children: Secondary | ICD-10-CM | POA: Insufficient documentation

## 2015-10-08 NOTE — Addendum Note (Signed)
Addended by: Freada Bergeron on: 10/08/2015 04:07 PM   Modules accepted: Orders

## 2015-10-09 ENCOUNTER — Telehealth (HOSPITAL_COMMUNITY): Payer: Self-pay | Admitting: *Deleted

## 2015-10-09 NOTE — Telephone Encounter (Signed)
Left message on voicemail per DPR in reference to upcoming appointment scheduled on 10/10/15 with detailed instructions given per Myocardial Perfusion Study Information Sheet for the test. LM to arrive 15 minutes early, and that it is imperative to arrive on time for appointment to keep from having the test rescheduled. If you need to cancel or reschedule your appointment, please call the office within 24 hours of your appointment. Failure to do so may result in a cancellation of your appointment, and a $50 no show fee. Phone number given for call back for any questions. Hubbard Robinson, RN

## 2015-10-10 ENCOUNTER — Ambulatory Visit (HOSPITAL_COMMUNITY)
Admission: RE | Admit: 2015-10-10 | Discharge: 2015-10-10 | Disposition: A | Discharge status: Home / Self Care | Payer: Medicaid Other | Source: Ambulatory Visit | Attending: Family Medicine | Admitting: Family Medicine

## 2015-10-10 ENCOUNTER — Other Ambulatory Visit: Payer: Self-pay | Admitting: Family Medicine

## 2015-10-10 ENCOUNTER — Other Ambulatory Visit: Payer: Self-pay | Admitting: Physician Assistant

## 2015-10-10 ENCOUNTER — Telehealth: Payer: Self-pay | Admitting: Internal Medicine

## 2015-10-10 DIAGNOSIS — I708 Atherosclerosis of other arteries: Secondary | ICD-10-CM | POA: Diagnosis not present

## 2015-10-10 DIAGNOSIS — R51 Headache: Secondary | ICD-10-CM | POA: Diagnosis present

## 2015-10-10 DIAGNOSIS — R519 Headache, unspecified: Secondary | ICD-10-CM

## 2015-10-11 ENCOUNTER — Ambulatory Visit (HOSPITAL_COMMUNITY): Payer: Medicaid Other | Attending: Cardiology

## 2015-10-11 DIAGNOSIS — R9439 Abnormal result of other cardiovascular function study: Secondary | ICD-10-CM | POA: Insufficient documentation

## 2015-10-11 DIAGNOSIS — I10 Essential (primary) hypertension: Secondary | ICD-10-CM | POA: Insufficient documentation

## 2015-10-11 DIAGNOSIS — R079 Chest pain, unspecified: Secondary | ICD-10-CM

## 2015-10-11 LAB — MYOCARDIAL PERFUSION IMAGING
CHL CUP NUCLEAR SDS: 2
CSEPPHR: 87 {beats}/min
LHR: 0.36
LV dias vol: 102 mL
LVSYSVOL: 55 mL
Rest HR: 71 {beats}/min
SRS: 6
SSS: 8
TID: 1.07

## 2015-10-11 MED ORDER — REGADENOSON 0.4 MG/5ML IV SOLN
0.4000 mg | Freq: Once | INTRAVENOUS | Status: AC
  Administered 2015-10-11: 0.4 mg via INTRAVENOUS

## 2015-10-11 MED ORDER — TECHNETIUM TC 99M SESTAMIBI GENERIC - CARDIOLITE
10.9000 | Freq: Once | INTRAVENOUS | Status: AC | PRN
  Administered 2015-10-11: 10.9 via INTRAVENOUS

## 2015-10-11 MED ORDER — TECHNETIUM TC 99M SESTAMIBI GENERIC - CARDIOLITE
32.8000 | Freq: Once | INTRAVENOUS | Status: AC | PRN
  Administered 2015-10-11: 32.8 via INTRAVENOUS

## 2015-10-11 MED ORDER — PANTOPRAZOLE SODIUM 40 MG PO TBEC: 40.0000 mg | DELAYED_RELEASE_TABLET | Freq: Two times a day (BID) | ORAL | Status: DC

## 2015-10-11 NOTE — Telephone Encounter (Signed)
Refilled Pantoprazole 

## 2015-10-12 ENCOUNTER — Other Ambulatory Visit: Payer: Self-pay | Admitting: *Deleted

## 2015-10-12 MED ORDER — ALLOPURINOL 100 MG PO TABS: 100.0000 mg | ORAL_TABLET | Freq: Every day | ORAL | Status: DC

## 2015-10-12 NOTE — Telephone Encounter (Signed)
-----   Message from Boykin Nearing, MD sent at 10/11/2015  5:19 PM EST -----  Sinuses with evidence of chronic sinusitis Plan to treat with short term increase dose of prednisone 20 mg BID for 5 days, then daily for 5 days then back to 10 mg daily. Along with  extended course of Augmentin x 3 weeks.  Follow up symptoms If no symptomatic improvement with refer to ENT

## 2015-10-12 NOTE — Telephone Encounter (Signed)
LVM to return call.

## 2015-10-12 NOTE — Telephone Encounter (Signed)
Date of birth verified by pt  Results given  Notified Rx at [facility]  Pt verbalized understanding  Requesting Allopurinol refill, refill send to pharmacy

## 2015-10-12 NOTE — Telephone Encounter (Signed)
Pt. Returned call. Please f/u with pt. °

## 2015-10-16 ENCOUNTER — Encounter: Payer: Self-pay | Admitting: Family Medicine

## 2015-10-16 ENCOUNTER — Ambulatory Visit: Payer: Medicaid Other | Attending: Family Medicine | Admitting: Family Medicine

## 2015-10-16 ENCOUNTER — Telehealth: Payer: Self-pay | Admitting: Cardiology

## 2015-10-16 VITALS — BP 104/69 | HR 74 | Temp 98.9°F | Resp 16 | Ht 66.0 in | Wt 214.0 lb

## 2015-10-16 DIAGNOSIS — Z79899 Other long term (current) drug therapy: Secondary | ICD-10-CM | POA: Insufficient documentation

## 2015-10-16 DIAGNOSIS — G4700 Insomnia, unspecified: Secondary | ICD-10-CM | POA: Insufficient documentation

## 2015-10-16 DIAGNOSIS — Z794 Long term (current) use of insulin: Secondary | ICD-10-CM | POA: Diagnosis not present

## 2015-10-16 DIAGNOSIS — J329 Chronic sinusitis, unspecified: Secondary | ICD-10-CM | POA: Insufficient documentation

## 2015-10-16 DIAGNOSIS — F5104 Psychophysiologic insomnia: Secondary | ICD-10-CM | POA: Insufficient documentation

## 2015-10-16 DIAGNOSIS — Z0389 Encounter for observation for other suspected diseases and conditions ruled out: Secondary | ICD-10-CM

## 2015-10-16 DIAGNOSIS — G4733 Obstructive sleep apnea (adult) (pediatric): Secondary | ICD-10-CM | POA: Diagnosis not present

## 2015-10-16 MED ORDER — AMOXICILLIN-POT CLAVULANATE 875-125 MG PO TABS: 1.0000 | ORAL_TABLET | Freq: Two times a day (BID) | ORAL | Status: DC

## 2015-10-16 MED ORDER — PREDNISONE 20 MG PO TABS: 20.0000 mg | ORAL_TABLET | Freq: Every day | ORAL | Status: DC

## 2015-10-16 MED ORDER — ZOLPIDEM TARTRATE 5 MG PO TABS: 5.0000 mg | ORAL_TABLET | Freq: Every evening | ORAL | Status: DC | PRN

## 2015-10-16 NOTE — Telephone Encounter (Signed)
Returning your call. °

## 2015-10-16 NOTE — Patient Instructions (Addendum)
Marco [name] was seen today for follow-up.  Diagnoses and all orders for this visit:  Chronic sinusitis, unspecified location -     amoxicillin-clavulanate (AUGMENTIN) 875-125 MG tablet; Take 1 tablet by mouth 2 (two) times daily. -     predniSONE (DELTASONE) 20 MG tablet; Take 1 tablet (20 mg total) by mouth daily with breakfast.  Insomnia -     zolpidem (AMBIEN) 5 MG tablet; Take 1 tablet (5 mg total) by mouth at bedtime as needed for sleep.   F/u in 4 weeks for chronic sinusitis/headaches   Dr. Adrian [place]

## 2015-10-16 NOTE — Telephone Encounter (Signed)
PT HAS BEEN MADE AWARE OF HIS STRESS TEST AND THAT WE NEED TO ORDER CTA. PT WILL AWAIT THE SCHEDULER TO CALL AND GET IT SET UP PT VERBALIZED UNDERSTANDING.

## 2015-10-16 NOTE — Progress Notes (Signed)
Medication management -sleeping pills Requesting Rx antibiotic  No pain today  No tobacco user  No suicidal thoughts

## 2015-10-16 NOTE — Telephone Encounter (Signed)
Returned pts call.  [email or web address]

## 2015-10-17 ENCOUNTER — Encounter: Payer: Self-pay | Admitting: Cardiology

## 2015-10-17 ENCOUNTER — Encounter: Payer: Self-pay | Admitting: Clinical

## 2015-10-17 ENCOUNTER — Encounter: Payer: Self-pay | Admitting: Family Medicine

## 2015-10-17 NOTE — Assessment & Plan Note (Addendum)
Chronic insomnia ambien 5 mg ordered

## 2015-10-17 NOTE — Progress Notes (Signed)
Depression screen [facility] 2/9 10/16/2015 09/20/2015 07/12/2015 06/14/2015 05/16/2015  Decreased Interest 0 0 0 0 2  Down, Depressed, Hopeless 0 2 0 0 3  PHQ - 2 Score 0 2 0 0 5  Altered sleeping 2 3 - - 3  Tired, decreased energy 3 3 - - 3  Change in appetite 0 0 - - 0  Feeling bad or failure about yourself  0 0 - - 3  Trouble concentrating 0 0 - - 0  Moving slowly or fidgety/restless 0 0 - - 0  Suicidal thoughts 0 0 - - 2  PHQ-9 Score 5 8 - - 16    GAD 7 : Generalized Anxiety Score 10/16/2015 09/20/2015  Nervous, Anxious, on Edge 0 0  Control/stop worrying 0 3  Worry too much - different things 0 3  Trouble relaxing 0 2  Restless 0 0  Easily annoyed or irritable 0 0  Afraid - awful might happen 0 0  Total GAD 7 Score 0 8

## 2015-10-17 NOTE — Assessment & Plan Note (Signed)
A: chronic sinusitis P: Augmentin x 3 weeks Prednisone 20 mg BID x 5, 20 mg daily x 5, then back to 10 mg daily

## 2015-10-17 NOTE — Progress Notes (Signed)
Subjective:  Patient ID: Marco [name], male    DOB: 08-12-1955  Age: 60 y.o. MRN: [id number]  CC: Follow-up   HPI Marco [name] presents for    1. Chronic sinusitis: still having HA. Awaiting rx for antibiotic. No fever or chills.   2. CP: persist. Ruled out  For ACS in ED on 09/20/15. He has been seen by cards on 10/01/2105 who is planning CT coronary morph. CP is not as severe as last OV. Substernal with MSK tenderness. No SOB.   3. Insomnia: persist. He has OSA requiring BiPAP. He is still having trouble sleeping and getting used his mask.   Social History  Substance Use Topics  . Smoking status: Never Smoker   . Smokeless tobacco: Not on file  . Alcohol Use: No    Outpatient Prescriptions Prior to Visit  Medication Sig Dispense Refill  . ACCU-CHEK FASTCLIX LANCETS MISC 1 each by Other route 3 (three) times daily. 102 each 11  . allopurinol (ZYLOPRIM) 100 MG tablet Take 1 tablet (100 mg total) by mouth daily. 30 tablet 1  . ASPIRIN LOW DOSE 81 MG EC tablet TAKE ONE TABLET BY MOUTH ONCE DAILY 90 tablet 0  . atorvastatin (LIPITOR) 40 MG tablet TAKE ONE TABLET BY MOUTH ONCE DAILY 90 tablet 1  . Blood Glucose Monitoring Suppl (ACCU-CHEK AVIVA PLUS) W/DEVICE KIT 1 Device by Does not apply route 3 (three) times daily after meals. E11.9 1 kit 0  . Blood Glucose Monitoring Suppl (ACCU-CHEK AVIVA PLUS) W/DEVICE KIT Used as directed 1 kit 0  . capsicum (ZOSTRIX) 0.075 % topical cream Apply 1 application topically 2 (two) times daily. 28.3 g 0  . carvedilol (COREG) 12.5 MG tablet TAKE ONE TABLET BY MOUTH TWICE DAILY 60 tablet 2  . diltiazem (DILACOR XR) 180 MG 24 hr capsule TAKE ONE CAPSULE BY MOUTH ONCE DAILY 30 capsule 3  . furosemide (LASIX) 40 MG tablet TAKE ONE TABLET BY MOUTH ONCE DAILY 30 tablet 0  . gabapentin (NEURONTIN) 300 MG capsule Take 2 capsules (600 mg total) by mouth 3 (three) times daily. 180 capsule 5  . glucose blood (ACCU-CHEK AVIVA PLUS) test strip 1 each by Other  route 3 (three) times daily. E11.9 100 each 12  . glucose blood test strip Use as instructed 100 each 12  . glucose blood test strip Use as instructed 100 each 12  . HYDROcodone-acetaminophen (NORCO/VICODIN) 5-325 MG tablet Take 1 tablet by mouth every 8 (eight) hours as needed for moderate pain. Per pain management    . insulin aspart (NOVOLOG) 100 UNIT/ML injection Inject 14-24 Units into the skin 3 (three) times daily with meals. Reported on 09/20/2015    . Insulin Detemir (LEVEMIR FLEXPEN) 100 UNIT/ML Pen Inject 60 Units into the skin daily at 10 pm. 15 mL 11  . Insulin Pen Needle (BD PEN NEEDLE NANO U/F) 32G X 4 MM MISC 1 each by Does not apply route 3 (three) times daily. 100 each 11  . Lancet Devices (ACCU-CHEK SOFTCLIX) lancets Use as instructed 1 each 11  . lisinopril (PRINIVIL,ZESTRIL) 30 MG tablet Take 1 tablet (30 mg total) by mouth daily. 30 tablet 3  . pantoprazole (PROTONIX) 40 MG tablet Take 1 tablet (40 mg total) by mouth 2 (two) times daily. 60 tablet 4  . predniSONE (DELTASONE) 10 MG tablet Take 1 tablet (10 mg total) by mouth daily with breakfast. 30 tablet 1  . ranitidine (ZANTAC) 150 MG capsule TAKE ONE CAPSULE BY MOUTH TWICE DAILY  60 capsule 0   No facility-administered medications prior to visit.    ROS Review of Systems  Constitutional: Negative for fever, chills, fatigue and unexpected weight change.  HENT:       Dry mouth   Eyes: Negative for visual disturbance.  Respiratory: Negative for cough and shortness of breath.   Cardiovascular: Positive for chest pain. Negative for palpitations and leg swelling.  Gastrointestinal: Negative for nausea, vomiting, abdominal pain, diarrhea, constipation and blood in stool.  Endocrine: Negative for polydipsia, polyphagia and polyuria.  Musculoskeletal: Negative for myalgias, back pain, arthralgias, gait problem and neck pain.  Skin: Negative for rash.  Allergic/Immunologic: Negative for immunocompromised state.  Neurological:  Positive for numbness (hands ) and headaches. Negative for dizziness and weakness.  Hematological: Negative for adenopathy. Does not bruise/bleed easily.  Psychiatric/Behavioral: Negative for suicidal ideas, sleep disturbance and dysphoric mood. The patient is not nervous/anxious.     Objective:  BP 104/69 mmHg  Pulse 74  Temp(Src) 98.9 F (37.2 C) (Oral)  Resp 16  Ht _0  (1.676 m)  Wt 214 lb (97.07 kg)  BMI 34.56 kg/m2  SpO2 95%  BP/Weight 10/16/2015 1/66/0630 08/17/107  Systolic BP 323 557 322  Diastolic BP 69 76 85  Wt. (Lbs) 214 223 -  BMI 34.56 36.01 -   Physical Exam  Constitutional: He appears well-developed and well-nourished. No distress.  HENT:  Head: Normocephalic and atraumatic.  Neck: Normal range of motion. Neck supple.  Cardiovascular: Normal rate, regular rhythm, normal heart sounds and intact distal pulses.   Pulmonary/Chest: Effort normal and breath sounds normal.  Musculoskeletal: He exhibits no edema.  Neurological: He is alert.  Skin: Skin is warm and dry. No rash noted. No erythema.  Psychiatric: He has a normal mood and affect.   Lab Results  Component Value Date   HGBA1C 12.10 09/20/2015    Assessment & Plan:   Jud was seen today for follow-up.  Diagnoses and all orders for this visit:  Chronic sinusitis, unspecified location -     amoxicillin-clavulanate (AUGMENTIN) 875-125 MG tablet; Take 1 tablet by mouth 2 (two) times daily. -     predniSONE (DELTASONE) 20 MG tablet; Take 1 tablet (20 mg total) by mouth daily with breakfast.  Insomnia -     zolpidem (AMBIEN) 5 MG tablet; Take 1 tablet (5 mg total) by mouth at bedtime as needed for sleep.    Meds ordered this encounter  Medications  . zolpidem (AMBIEN) 5 MG tablet    Sig: Take 1 tablet (5 mg total) by mouth at bedtime as needed for sleep.    Dispense:  30 tablet    Refill:  0  . amoxicillin-clavulanate (AUGMENTIN) 875-125 MG tablet    Sig: Take 1 tablet by mouth 2 (two) times  daily.    Dispense:  42 tablet    Refill:  0  . predniSONE (DELTASONE) 20 MG tablet    Sig: Take 1 tablet (20 mg total) by mouth daily with breakfast.    Dispense:  15 tablet    Refill:  0    Follow-up: No Follow-up on file.   Boykin Nearing MD

## 2015-10-26 ENCOUNTER — Ambulatory Visit (HOSPITAL_COMMUNITY)
Admission: RE | Admit: 2015-10-26 | Discharge: 2015-10-26 | Disposition: A | Discharge status: Home / Self Care | Payer: Medicaid Other | Source: Ambulatory Visit | Attending: Cardiology | Admitting: Cardiology

## 2015-10-26 ENCOUNTER — Encounter (HOSPITAL_COMMUNITY): Payer: Self-pay

## 2015-10-26 DIAGNOSIS — Z0389 Encounter for observation for other suspected diseases and conditions ruled out: Secondary | ICD-10-CM | POA: Insufficient documentation

## 2015-10-26 DIAGNOSIS — I2510 Atherosclerotic heart disease of native coronary artery without angina pectoris: Secondary | ICD-10-CM | POA: Diagnosis not present

## 2015-10-26 DIAGNOSIS — R932 Abnormal findings on diagnostic imaging of liver and biliary tract: Secondary | ICD-10-CM | POA: Diagnosis not present

## 2015-10-26 DIAGNOSIS — K760 Fatty (change of) liver, not elsewhere classified: Secondary | ICD-10-CM | POA: Insufficient documentation

## 2015-10-26 DIAGNOSIS — R079 Chest pain, unspecified: Secondary | ICD-10-CM | POA: Diagnosis not present

## 2015-10-26 MED ORDER — IOHEXOL 350 MG/ML SOLN
80.0000 mL | Freq: Once | INTRAVENOUS | Status: AC | PRN
  Administered 2015-10-26: 80 mL via INTRAVENOUS

## 2015-10-26 MED ORDER — NITROGLYCERIN 0.4 MG SL SUBL
0.8000 mg | SUBLINGUAL_TABLET | SUBLINGUAL | Status: DC | PRN
Start: 2015-10-26 — End: 2015-10-27
  Administered 2015-10-26: 0.8 mg via SUBLINGUAL

## 2015-10-26 MED ORDER — METOPROLOL TARTRATE 1 MG/ML IV SOLN
INTRAVENOUS | Status: AC
  Filled 2015-10-26: qty 5

## 2015-10-26 MED ORDER — METOPROLOL TARTRATE 1 MG/ML IV SOLN
5.0000 mg | Freq: Once | INTRAVENOUS | Status: AC
  Administered 2015-10-26: 5 mg via INTRAVENOUS

## 2015-10-26 MED ORDER — NITROGLYCERIN 0.4 MG SL SUBL
SUBLINGUAL_TABLET | SUBLINGUAL | Status: AC
  Filled 2015-10-26: qty 2

## 2015-11-02 ENCOUNTER — Telehealth: Payer: Self-pay | Admitting: *Deleted

## 2015-11-02 ENCOUNTER — Telehealth: Payer: Self-pay | Admitting: Cardiology

## 2015-11-02 DIAGNOSIS — R932 Abnormal findings on diagnostic imaging of liver and biliary tract: Secondary | ICD-10-CM

## 2015-11-02 NOTE — Telephone Encounter (Signed)
Pt hs been made aware of his results. He will come in office 11/05/15 for lab work. Pt states that he cannot do a MRI.  He stated that he has tried even with a sedative but could not do it.  What is the next recommendation per pt? Pt was advised that I would get back with Cecilie Kicks, NP to see what the recommendation was and call him back Pt verbalized understanding Orders for FLP put in EPIC and pt is aware NPO after midnight Sunday

## 2015-11-02 NOTE — Telephone Encounter (Signed)
-----   Message from Isaiah Serge, NP sent at 11/01/2015 11:10 PM EDT ----- Please arrange fasting lipids and let him know the coronary disease is stable.  Also please arrange MRI of ABD  With and without GAD ----- Message -----    From: Sueanne Margarita, MD    Sent: 11/01/2015   7:57 PM      To: Isaiah Serge, NP  Given minmal ischemia in basal inferolateral wall that could be diaphragm and nuclear is low risk with nonobstructive CAD on CT would try medical management first with addition of long acting nitrates and see back in a few weeks.  Would also check an FLP to see if LDL at goal.  OK to proceed with ordering Abdominal MRI with and without GAD and forward CT findings to PCP  Traci ----- Message -----    From: Isaiah Serge, NP    Sent: 10/29/2015   2:19 PM      To: Sueanne Margarita, MD  Pt with chest pain and low risk nuc but ? Area of ischemia now with this CTA.  Please recommendations?  Also for liver issue noted on exam- ?refer to PCP or proceed with non emergent abd. MRI?  Thanks.

## 2015-11-02 NOTE — Telephone Encounter (Signed)
Follow Up:    Pt said you called this morning to give him results. He would like to talk to you again,he could not remember all that you said.

## 2015-11-02 NOTE — Telephone Encounter (Signed)
Returned pts call.  He wanted to go back over the results we discussed this morning.  Everything was re-explained to pt. Pt verbalized understanding.

## 2015-11-06 ENCOUNTER — Telehealth: Payer: Self-pay | Admitting: Family Medicine

## 2015-11-06 NOTE — Telephone Encounter (Signed)
Patient called requesting a medication refill for furosemide and carvedilol

## 2015-11-07 ENCOUNTER — Other Ambulatory Visit: Payer: Self-pay | Admitting: *Deleted

## 2015-11-07 MED ORDER — FUROSEMIDE 40 MG PO TABS: 40.0000 mg | ORAL_TABLET | Freq: Every day | ORAL | Status: DC

## 2015-11-07 MED ORDER — CARVEDILOL 12.5 MG PO TABS: 12.5000 mg | ORAL_TABLET | Freq: Two times a day (BID) | ORAL | Status: DC

## 2015-11-07 NOTE — Telephone Encounter (Signed)
Rx send to [facility]  LVM to pt Rx refills send to [facility]

## 2015-11-13 ENCOUNTER — Ambulatory Visit: Payer: Medicaid Other | Attending: Family Medicine | Admitting: Family Medicine

## 2015-11-13 ENCOUNTER — Ambulatory Visit (INDEPENDENT_AMBULATORY_CARE_PROVIDER_SITE_OTHER): Payer: Medicaid Other | Admitting: Internal Medicine

## 2015-11-13 ENCOUNTER — Encounter: Payer: Self-pay | Admitting: Internal Medicine

## 2015-11-13 ENCOUNTER — Other Ambulatory Visit: Payer: Medicaid Other | Admitting: *Deleted

## 2015-11-13 ENCOUNTER — Other Ambulatory Visit: Payer: Medicaid Other

## 2015-11-13 ENCOUNTER — Encounter: Payer: Self-pay | Admitting: Family Medicine

## 2015-11-13 VITALS — BP 140/80 | HR 16 | Temp 98.0°F | Resp 16 | Ht 66.0 in | Wt 219.0 lb

## 2015-11-13 VITALS — BP 148/94 | HR 60 | Ht 66.0 in | Wt 219.2 lb

## 2015-11-13 DIAGNOSIS — R079 Chest pain, unspecified: Secondary | ICD-10-CM

## 2015-11-13 DIAGNOSIS — K591 Functional diarrhea: Secondary | ICD-10-CM

## 2015-11-13 DIAGNOSIS — E291 Testicular hypofunction: Secondary | ICD-10-CM

## 2015-11-13 DIAGNOSIS — Z7984 Long term (current) use of oral hypoglycemic drugs: Secondary | ICD-10-CM | POA: Diagnosis not present

## 2015-11-13 DIAGNOSIS — Z79899 Other long term (current) drug therapy: Secondary | ICD-10-CM | POA: Insufficient documentation

## 2015-11-13 DIAGNOSIS — Z794 Long term (current) use of insulin: Secondary | ICD-10-CM | POA: Diagnosis not present

## 2015-11-13 DIAGNOSIS — Z86010 Personal history of colonic polyps: Secondary | ICD-10-CM

## 2015-11-13 DIAGNOSIS — E119 Type 2 diabetes mellitus without complications: Secondary | ICD-10-CM | POA: Diagnosis not present

## 2015-11-13 DIAGNOSIS — K219 Gastro-esophageal reflux disease without esophagitis: Secondary | ICD-10-CM | POA: Diagnosis not present

## 2015-11-13 DIAGNOSIS — N644 Mastodynia: Secondary | ICD-10-CM | POA: Diagnosis not present

## 2015-11-13 DIAGNOSIS — R1310 Dysphagia, unspecified: Secondary | ICD-10-CM | POA: Diagnosis not present

## 2015-11-13 DIAGNOSIS — R7989 Other specified abnormal findings of blood chemistry: Secondary | ICD-10-CM

## 2015-11-13 LAB — GLUCOSE, POCT (MANUAL RESULT ENTRY): POC Glucose: 173 mg/dl — AB (ref 70–99)

## 2015-11-13 NOTE — Progress Notes (Signed)
Subjective:  Patient ID: Marco [name], male    DOB: 1956/03/28  Age: 60 y.o. MRN: [id number]  CC: No chief complaint on file.   HPI Marco [name] presents for    1. Chronic sinusitis: HA have improved. He has completed Augmentin. He is down to baseline steroid dose of 10 mg daily.   2. Chest pain: persist. He  ruled out For ACS in ED on 09/20/15. He has been seen by cards on 10/01/2105. He has completed CT coronary which is low risk with nonobstructive CAD on 10/26/2015. He has also been seen by GI. He continues PPI and H2 blocker. His pain is suspected to be MSK in nature.  He primary complains of b/l breast pain today. He has known testosterone deficiency. He has experienced erectile dysfunction for the past 3 years. He has never been on testosterone replacement. He has no breast lumps. He has no family history of breat cancer.   3. Diabetes: compliant with all meds. Home CBGs 120--145.   Social History  Substance Use Topics  . Smoking status: Never Smoker   . Smokeless tobacco: Never Used  . Alcohol Use: No    Outpatient Prescriptions Prior to Visit  Medication Sig Dispense Refill  . ACCU-CHEK FASTCLIX LANCETS MISC 1 each by Other route 3 (three) times daily. 102 each 11  . allopurinol (ZYLOPRIM) 100 MG tablet Take 1 tablet (100 mg total) by mouth daily. 30 tablet 1  . ASPIRIN LOW DOSE 81 MG EC tablet TAKE ONE TABLET BY MOUTH ONCE DAILY 90 tablet 0  . atorvastatin (LIPITOR) 40 MG tablet TAKE ONE TABLET BY MOUTH ONCE DAILY 90 tablet 1  . Blood Glucose Monitoring Suppl (ACCU-CHEK AVIVA PLUS) W/DEVICE KIT 1 Device by Does not apply route 3 (three) times daily after meals. E11.9 1 kit 0  . Blood Glucose Monitoring Suppl (ACCU-CHEK AVIVA PLUS) W/DEVICE KIT Used as directed 1 kit 0  . carvedilol (COREG) 12.5 MG tablet Take 1 tablet (12.5 mg total) by mouth 2 (two) times daily. 60 tablet 2  . diltiazem (DILACOR XR) 180 MG 24 hr capsule TAKE ONE CAPSULE BY MOUTH ONCE DAILY 30 capsule 3  .  furosemide (LASIX) 40 MG tablet Take 1 tablet (40 mg total) by mouth daily. 30 tablet 0  . glucose blood (ACCU-CHEK AVIVA PLUS) test strip 1 each by Other route 3 (three) times daily. E11.9 100 each 12  . glucose blood test strip Use as instructed 100 each 12  . glucose blood test strip Use as instructed 100 each 12  . HYDROcodone-acetaminophen (NORCO/VICODIN) 5-325 MG tablet Take 1 tablet by mouth every 8 (eight) hours as needed for moderate pain. Per pain management    . insulin aspart (NOVOLOG) 100 UNIT/ML injection Inject 14-24 Units into the skin 3 (three) times daily with meals. Reported on 09/20/2015    . Insulin Detemir (LEVEMIR FLEXPEN) 100 UNIT/ML Pen Inject 60 Units into the skin daily at 10 pm. 15 mL 11  . Insulin Pen Needle (BD PEN NEEDLE NANO U/F) 32G X 4 MM MISC 1 each by Does not apply route 3 (three) times daily. 100 each 11  . Lancet Devices (ACCU-CHEK SOFTCLIX) lancets Use as instructed 1 each 11  . lisinopril (PRINIVIL,ZESTRIL) 30 MG tablet Take 1 tablet (30 mg total) by mouth daily. 30 tablet 3  . metFORMIN (GLUCOPHAGE-XR) 500 MG 24 hr tablet Take 500 mg by mouth.    . pantoprazole (PROTONIX) 40 MG tablet Take 1 tablet (40 mg total)  by mouth daily. 60 tablet 4  . predniSONE (DELTASONE) 10 MG tablet Take 1 tablet (10 mg total) by mouth daily with breakfast. 30 tablet 1  . ranitidine (ZANTAC) 150 MG capsule Take 1 capsule (150 mg total) by mouth daily. 60 capsule 0  . zolpidem (AMBIEN) 5 MG tablet Take 1 tablet (5 mg total) by mouth at bedtime as needed for sleep. 30 tablet 0  . pantoprazole (PROTONIX) 40 MG tablet Take 1 tablet (40 mg total) by mouth 2 (two) times daily. 60 tablet 4  . ranitidine (ZANTAC) 150 MG capsule TAKE ONE CAPSULE BY MOUTH TWICE DAILY 60 capsule 0   No facility-administered medications prior to visit.    ROS Review of Systems  Constitutional: Negative for fever, chills, fatigue and unexpected weight change.  HENT:       Dry mouth   Eyes: Negative for  visual disturbance.  Respiratory: Negative for cough and shortness of breath.   Cardiovascular: Positive for chest pain. Negative for palpitations and leg swelling.  Gastrointestinal: Negative for nausea, vomiting, abdominal pain, diarrhea, constipation and blood in stool.  Endocrine: Negative for polydipsia, polyphagia and polyuria.  Musculoskeletal: Negative for myalgias, back pain, arthralgias, gait problem and neck pain.  Skin: Negative for rash.  Allergic/Immunologic: Negative for immunocompromised state.  Neurological: Positive for numbness (hands ) and headaches. Negative for dizziness and weakness.  Hematological: Negative for adenopathy. Does not bruise/bleed easily.  Psychiatric/Behavioral: Negative for suicidal ideas, sleep disturbance and dysphoric mood. The patient is not nervous/anxious.     Objective:  BP 140/80 mmHg  Pulse 16  Temp(Src) 98 F (36.7 C) (Oral)  Resp 16  Ht '5\' 6"'$  (1.676 m)  Wt 219 lb (99.338 kg)  BMI 35.36 kg/m2  SpO2 97%  BP/Weight 11/13/2015 11/13/2015 01/15/3015  Systolic BP 010 932 355  Diastolic BP 80 94 65  Wt. (Lbs) 219 219.2 -  BMI 35.36 35.4 -   Physical Exam  Constitutional: He appears well-developed and well-nourished. No distress.  HENT:  Head: Normocephalic and atraumatic.  Neck: Normal range of motion. Neck supple.  Cardiovascular: Normal rate, regular rhythm, normal heart sounds and intact distal pulses.   Pulmonary/Chest: Effort normal and breath sounds normal. Right breast exhibits tenderness. Right breast exhibits no inverted nipple, no mass, no nipple discharge and no skin change. Left breast exhibits tenderness. Left breast exhibits no inverted nipple, no mass, no nipple discharge and no skin change.  Diffuse breast tenderness with no breast mass or skin changes   Musculoskeletal: He exhibits no edema.  Neurological: He is alert.  Skin: Skin is warm and dry. No rash noted. No erythema.  Psychiatric: He has a normal mood and affect.    Lab Results  Component Value Date   HGBA1C 12.10 09/20/2015   CBG 173 Assessment & Plan:   Marco [name] was seen today for diabetes.  Diagnoses and all orders for this visit:  Type 2 diabetes mellitus without complication, without long-term current use of insulin (HCC) -     POCT glucose (manual entry)  Low serum testosterone level -     Ambulatory referral to Urology    No orders of the defined types were placed in this encounter.    Follow-up: No Follow-up on file.   Boykin Nearing MD

## 2015-11-13 NOTE — Progress Notes (Signed)
F/U Dm  C/C breast pain and soreness  Taking medication as prescribed  Glucose been running 120-145 No tobacco user  No suicidal thoughts in the past two weeks  Pain scale #9

## 2015-11-13 NOTE — Patient Instructions (Signed)
Decrease your Protonix and Zantac to 1 daily.  Follow up as needed with Dr Henrene Pastor.  If you are age 61 or older, your body mass index should be between 23-30. Your Body mass index is 35.4 kg/(m^2). If this is out of the aforementioned range listed, please consider follow up with your Primary Care Provider.  If you are age 54 or younger, your body mass index should be between 19-25. Your Body mass index is 35.4 kg/(m^2). If this is out of the aformentioned range listed, please consider follow up with your Primary Care Provider.

## 2015-11-13 NOTE — Patient Instructions (Addendum)
Marco [name] was seen today for diabetes.  Diagnoses and all orders for this visit:  Type 2 diabetes mellitus without complication, without long-term current use of insulin (HCC) -     POCT glucose (manual entry)  Low serum testosterone level -     Ambulatory referral to Urology   Medicaid does not appear to cover the injections I have referred you to urology for treatment of low testosterone   F/u in 4 weeks for diabetes you are due for A1c check   Dr. Adrian Blackwater

## 2015-11-13 NOTE — Progress Notes (Signed)
HISTORY OF PRESENT ILLNESS:  Marco [name] is a 60 y.o. male with past medical history as listed below. He has poorly controlled diabetes, chronic pain syndrome, pain clinic client, GERD, and chronic chest pain syndrome. The patient was last seen in September 2016 when he underwent colonoscopy for adenomatous polyp surveillance and evaluation of diarrhea. He also underwent upper endoscopy with esophageal dilation for chest pain, dysphagia, and a history of reflux. Colonoscopy revealed prior right hemicolectomy (elsewhere). Normal ileum. Moderate diverticulosis. And diminutive tubular adenomatous. Random colon biopsies were normal. Follow-up in 5 years recommended. Imodium recommended for diarrhea. Patient reports that his diarrhea has resolved. Upper endoscopy was normal. The esophagus was empirically dilated to [place]. He was continued on acid suppressive medication and asked to follow-up in 2 months. He follows up at this time. Still with very poor diabetes control. Last hemoglobin A1c in February 12.1. Patient reports to me that he continues with chest pain. He is currently on twice a day PPI and twice a day H2 receptor antagonist therapy. No heartburn. His dysphagia has resolved. He reports that his chest pain that he has had for years is chronic, daily, and in the chest wall. Discomfort with direct palpation. He sees pain management for this. There has been reports of discomfort with deep breathing. CT angiogram of the chest previously was negative for pulmonary embolus or other relevant abnormalities. GI review of systems is otherwise negative.  REVIEW OF SYSTEMS:  All non-GI ROS negative except for arthritis  Past Medical History  Diagnosis Date  . Non-obstructive CAD     a. 02/2013 Cath ([place]): nonobs dzs;  b. 09/2014 Myoview ([place]): EF 55%, no ischemia;  c. 12/2014 Echo: EF 50-55%, gr1 DD, no effusion.  . Essential hypertension   . Diabetes mellitus without complication ([place])   .  Rheumatoid arthritis ([place])   . Chronic pain   . PAF (paroxysmal atrial fibrillation) ([place])     a. 02/2013 s/p rfca in [place], [place]-->prev on Xarelto, d/c'd 2/2 anemia, ? GIB.  [name] Benign colon polyp 08/01/2013    [facility] in [place]. large base tranverse colon polyp was biopsied. polyp was benign with minimal surface hyperplastic change.  . Morbid obesity ([place])   . Gastritis 07/29/2013    confirmed on EGD, bx done an negative for intestinal metaplasia, dsyplasia or H. pylori. normal gastric emptying study done 07/13/2013.  [name] Esophageal hiatal hernia 07/29/2013    confirmed on EGD   . Sigmoid diverticulosis 08/01/2013    confirmed on colonscopy. record scanned into chart  . GERD (gastroesophageal reflux disease)   . Esophageal stricture   . Diverticulosis     Past Surgical History  Procedure Laterality Date  . Colon surgery  09/2014    colon resection   . Cardiac catheterization  05/2014    ablation for atrial fibrillation  . Colon resection  09/2013    due to large, abnormal polpy. non cancerous per patient.     Social History Carlson Phoebus  reports that he has never smoked. He has never used smokeless tobacco. He reports that he does not drink alcohol or use illicit drugs.  family history includes COPD in his father and mother; Cancer in his mother; Diabetes in his father and mother; Heart Problems in his father; Hypertension in his father and mother.  No Known Allergies     PHYSICAL EXAMINATION: Vital signs: BP 148/94 mmHg  Pulse 60  Ht 5\' 6"  (1.676 m)  Wt 219  lb 3.2 oz (99.428 kg)  BMI 35.40 kg/m2  Constitutional: Pleasant, generally well-appearing, no acute distress Psychiatric: alert and oriented x3, cooperative Eyes: extraocular movements intact, anicteric, conjunctiva pink Mouth: oral pharynx moist, no lesions Neck: supple without thyromegaly Lymph: no lymphadenopathy Chest wall: Tenderness with palpation over the chest wall. He states that this  reproduces his pain Cardiovascular: heart regular rate and rhythm, no murmur Lungs: clear to auscultation bilaterally Abdomen: soft, nontender, nondistended, no obvious ascites, no peritoneal signs, normal bowel sounds, no organomegaly Rectal: Rectal exam in September unremarkable. Not repeated Extremities: no clubbing cyanosis or lower extremity edema bilaterally Skin: no lesions on visible extremities Neuro: No focal deficits. Normal DTRs  ASSESSMENT:  #1. Chronic chest pain. Most consistent with musculoskeletal etiology #2. GERD. Placed on escalating doses of acid suppressive therapy for chest pain without improvement chest pain. He needs lowest dose of acid suppressive medication to control classic reflux symptoms, not chest pain #3. Dysphagia. Resolved post dilation. Suspect subtle ring or stricture was dilated #4. Issues with diarrhea. Workup as outlined. Resolved. #5. Multiple medical problems including poorly controlled diabetes #6. Adenomatous colon polyps   PLAN:  #1. Continue with PCP and pain clinic regarding chronic musculoskeletal chest pain #2. Advised to decrease PPI to once daily as well as H2 receptor antagonist therapy to once daily. Watch for emergence of classic reflux symptoms. If not, continue with the same #3. Imodium as needed for recurrent diarrhea #4. Continue with PCP and specialist regarding multiple medical problems #5. Surveillance colonoscopy around September 2021 #6. Interval GI follow-up as needed  25 minutes was spent face-to-face with the patient. Greater than 50% of the time was used for counseling regarding management of GERD, chronic chest pain not felt to be GI, and recommendations as outlined. Multiple questions answered

## 2015-11-14 ENCOUNTER — Encounter: Payer: Self-pay | Admitting: Clinical

## 2015-11-14 DIAGNOSIS — N644 Mastodynia: Secondary | ICD-10-CM | POA: Insufficient documentation

## 2015-11-14 DIAGNOSIS — R079 Chest pain, unspecified: Secondary | ICD-10-CM | POA: Insufficient documentation

## 2015-11-14 NOTE — Assessment & Plan Note (Signed)
Low testosterone with erectile dysfunction and tender gynecomastia Urology referral for testosterone treatment

## 2015-11-14 NOTE — Assessment & Plan Note (Signed)
Chronic CP with nonobstructive CAD and normal EGD This is likely MSK pain  Plan for pain control with vicodin per pain management

## 2015-11-14 NOTE — Progress Notes (Signed)
Depression screen [facility] 2/9 11/13/2015 10/16/2015 09/20/2015 07/12/2015 06/14/2015  Decreased Interest 0 0 0 0 0  Down, Depressed, Hopeless 0 0 2 0 0  PHQ - 2 Score 0 0 2 0 0  Altered sleeping 3 2 3  - -  Tired, decreased energy 2 3 3  - -  Change in appetite - 0 0 - -  Feeling bad or failure about yourself  0 0 0 - -  Trouble concentrating 0 0 0 - -  Moving slowly or fidgety/restless 0 0 0 - -  Suicidal thoughts 0 0 0 - -  PHQ-9 Score 5 5 8  - -   GAD 7 : Generalized Anxiety Score 11/13/2015 10/16/2015 09/20/2015  Nervous, Anxious, on Edge 0 0 0  Control/stop worrying 1 0 3  Worry too much - different things 2 0 3  Trouble relaxing 2 0 2  Restless 0 0 0  Easily annoyed or irritable 0 0 0  Afraid - awful might happen 2 0 0  Total GAD 7 Score 7 0 8

## 2015-11-15 ENCOUNTER — Telehealth: Payer: Self-pay | Admitting: *Deleted

## 2015-11-15 DIAGNOSIS — G4700 Insomnia, unspecified: Secondary | ICD-10-CM

## 2015-11-15 MED ORDER — ZOLPIDEM TARTRATE 5 MG PO TABS: 5.0000 mg | ORAL_TABLET | Freq: Every evening | ORAL | Status: DC | PRN

## 2015-11-15 NOTE — Telephone Encounter (Signed)
ambien refilled Please fax in

## 2015-11-15 NOTE — Telephone Encounter (Signed)
[facility] pharmacy requesting refill Rx Ambien

## 2015-11-16 ENCOUNTER — Encounter: Payer: Self-pay | Admitting: Cardiology

## 2015-11-16 ENCOUNTER — Other Ambulatory Visit: Payer: Self-pay | Admitting: Cardiology

## 2015-11-16 ENCOUNTER — Ambulatory Visit (INDEPENDENT_AMBULATORY_CARE_PROVIDER_SITE_OTHER): Payer: Medicaid Other | Admitting: Cardiology

## 2015-11-16 ENCOUNTER — Ambulatory Visit (HOSPITAL_COMMUNITY): Admit: 2015-11-16 | Payer: Self-pay | Admitting: Cardiology

## 2015-11-16 ENCOUNTER — Encounter (HOSPITAL_COMMUNITY)
Admission: EM | Disposition: A | Discharge status: Home / Self Care | Payer: Self-pay | Source: Home / Self Care | Attending: Emergency Medicine

## 2015-11-16 ENCOUNTER — Encounter (HOSPITAL_COMMUNITY): Payer: Self-pay | Admitting: *Deleted

## 2015-11-16 ENCOUNTER — Emergency Department (HOSPITAL_COMMUNITY)
Admission: EM | Admit: 2015-11-16 | Discharge: 2015-11-16 | Disposition: A | Discharge status: Home / Self Care | Payer: Medicaid Other | Attending: Emergency Medicine | Admitting: Emergency Medicine

## 2015-11-16 VITALS — BP 182/96 | HR 77 | Ht 66.0 in | Wt 216.1 lb

## 2015-11-16 DIAGNOSIS — E119 Type 2 diabetes mellitus without complications: Secondary | ICD-10-CM | POA: Diagnosis not present

## 2015-11-16 DIAGNOSIS — E785 Hyperlipidemia, unspecified: Secondary | ICD-10-CM

## 2015-11-16 DIAGNOSIS — I25110 Atherosclerotic heart disease of native coronary artery with unstable angina pectoris: Secondary | ICD-10-CM | POA: Diagnosis not present

## 2015-11-16 DIAGNOSIS — I10 Essential (primary) hypertension: Secondary | ICD-10-CM | POA: Diagnosis not present

## 2015-11-16 DIAGNOSIS — R079 Chest pain, unspecified: Secondary | ICD-10-CM | POA: Insufficient documentation

## 2015-11-16 DIAGNOSIS — I2583 Coronary atherosclerosis due to lipid rich plaque: Principal | ICD-10-CM

## 2015-11-16 DIAGNOSIS — Z8719 Personal history of other diseases of the digestive system: Secondary | ICD-10-CM | POA: Insufficient documentation

## 2015-11-16 DIAGNOSIS — I480 Paroxysmal atrial fibrillation: Secondary | ICD-10-CM

## 2015-11-16 DIAGNOSIS — I2510 Atherosclerotic heart disease of native coronary artery without angina pectoris: Secondary | ICD-10-CM

## 2015-11-16 DIAGNOSIS — I200 Unstable angina: Secondary | ICD-10-CM | POA: Insufficient documentation

## 2015-11-16 HISTORY — PX: CARDIAC CATHETERIZATION: SHX172

## 2015-11-16 LAB — CBC WITH DIFFERENTIAL/PLATELET
BASOS ABS: 0 10*3/uL (ref 0.0–0.1)
BASOS PCT: 0 %
EOS ABS: 0.1 10*3/uL (ref 0.0–0.7)
Eosinophils Relative: 1 %
HEMATOCRIT: 44 % (ref 39.0–52.0)
HEMOGLOBIN: 13.9 g/dL (ref 13.0–17.0)
Lymphocytes Relative: 19 %
Lymphs Abs: 1.8 10*3/uL (ref 0.7–4.0)
MCH: 29.4 pg (ref 26.0–34.0)
MCHC: 31.6 g/dL (ref 30.0–36.0)
MCV: 93 fL (ref 78.0–100.0)
MONOS PCT: 11 %
Monocytes Absolute: 1.1 10*3/uL — ABNORMAL HIGH (ref 0.1–1.0)
NEUTROS PCT: 69 %
Neutro Abs: 6.5 10*3/uL (ref 1.7–7.7)
Platelets: 224 10*3/uL (ref 150–400)
RBC: 4.73 MIL/uL (ref 4.22–5.81)
RDW: 14.2 % (ref 11.5–15.5)
WBC: 9.5 10*3/uL (ref 4.0–10.5)

## 2015-11-16 LAB — BASIC METABOLIC PANEL
ANION GAP: 15 (ref 5–15)
BUN: 11 mg/dL (ref 6–20)
CALCIUM: 9.5 mg/dL (ref 8.9–10.3)
CO2: 26 mmol/L (ref 22–32)
Chloride: 102 mmol/L (ref 101–111)
Creatinine, Ser: 1.23 mg/dL (ref 0.61–1.24)
GFR calc Af Amer: >60 mL/min (ref >60)
GLUCOSE: 162 mg/dL — AB (ref 65–99)
Potassium: 3.4 mmol/L — ABNORMAL LOW (ref 3.5–5.1)
SODIUM: 143 mmol/L (ref 135–145)

## 2015-11-16 LAB — URINALYSIS, ROUTINE W REFLEX MICROSCOPIC
BILIRUBIN URINE: NEGATIVE
Glucose, UA: NEGATIVE mg/dL
HGB URINE DIPSTICK: NEGATIVE
KETONES UR: NEGATIVE mg/dL
Leukocytes, UA: NEGATIVE
Nitrite: NEGATIVE
PH: 5 (ref 5.0–8.0)
Protein, ur: NEGATIVE mg/dL
SPECIFIC GRAVITY, URINE: 1.008 (ref 1.005–1.030)

## 2015-11-16 LAB — PROTIME-INR
INR: 1.06 (ref 0.00–1.49)
PROTHROMBIN TIME: 14 s (ref 11.6–15.2)

## 2015-11-16 LAB — I-STAT TROPONIN, ED: TROPONIN I, POC: 0.05 ng/mL (ref 0.00–0.08)

## 2015-11-16 LAB — GLUCOSE, CAPILLARY: Glucose-Capillary: 164 mg/dL — ABNORMAL HIGH (ref 65–99)

## 2015-11-16 SURGERY — LEFT HEART CATH AND CORONARY ANGIOGRAPHY
Anesthesia: LOCAL

## 2015-11-16 MED ORDER — HEPARIN (PORCINE) IN NACL 2-0.9 UNIT/ML-% IJ SOLN
INTRAMUSCULAR | Status: AC
  Filled 2015-11-16: qty 1000

## 2015-11-16 MED ORDER — VERAPAMIL HCL 2.5 MG/ML IV SOLN
INTRAVENOUS | Status: DC | PRN
  Administered 2015-11-16: 14:00:00 via INTRA_ARTERIAL

## 2015-11-16 MED ORDER — SODIUM CHLORIDE 0.9 % WEIGHT BASED INFUSION: 3.0000 mL/kg/h | INTRAVENOUS | Status: DC

## 2015-11-16 MED ORDER — HEPARIN (PORCINE) IN NACL 2-0.9 UNIT/ML-% IJ SOLN
INTRAMUSCULAR | Status: DC | PRN
  Administered 2015-11-16: 1000 mL

## 2015-11-16 MED ORDER — SODIUM CHLORIDE 0.9% FLUSH: 3.0000 mL | INTRAVENOUS | Status: DC | PRN

## 2015-11-16 MED ORDER — HEPARIN SODIUM (PORCINE) 1000 UNIT/ML IJ SOLN
INTRAMUSCULAR | Status: DC | PRN
  Administered 2015-11-16: 5000 [IU] via INTRAVENOUS

## 2015-11-16 MED ORDER — MORPHINE SULFATE (PF) 2 MG/ML IV SOLN: 2.0000 mg | INTRAVENOUS | Status: DC | PRN

## 2015-11-16 MED ORDER — SODIUM CHLORIDE 0.9 % IV SOLN: 250.0000 mL | INTRAVENOUS | Status: DC | PRN

## 2015-11-16 MED ORDER — HEPARIN SODIUM (PORCINE) 1000 UNIT/ML IJ SOLN
INTRAMUSCULAR | Status: AC
  Filled 2015-11-16: qty 1

## 2015-11-16 MED ORDER — LIDOCAINE HCL (PF) 1 % IJ SOLN
INTRAMUSCULAR | Status: DC | PRN
  Administered 2015-11-16: 2 mL

## 2015-11-16 MED ORDER — SODIUM CHLORIDE 0.9% FLUSH: 3.0000 mL | Freq: Two times a day (BID) | INTRAVENOUS | Status: DC

## 2015-11-16 MED ORDER — LIDOCAINE HCL (PF) 1 % IJ SOLN
INTRAMUSCULAR | Status: AC
  Filled 2015-11-16: qty 30

## 2015-11-16 MED ORDER — MIDAZOLAM HCL 2 MG/2ML IJ SOLN
INTRAMUSCULAR | Status: AC
  Filled 2015-11-16: qty 2

## 2015-11-16 MED ORDER — MIDAZOLAM HCL 2 MG/2ML IJ SOLN
INTRAMUSCULAR | Status: DC | PRN
  Administered 2015-11-16: 2 mg via INTRAVENOUS

## 2015-11-16 MED ORDER — IOPAMIDOL (ISOVUE-370) INJECTION 76%
INTRAVENOUS | Status: DC | PRN
  Administered 2015-11-16: 60 mL via INTRA_ARTERIAL

## 2015-11-16 MED ORDER — VERAPAMIL HCL 2.5 MG/ML IV SOLN
INTRAVENOUS | Status: AC
  Filled 2015-11-16: qty 2

## 2015-11-16 MED ORDER — ACETAMINOPHEN 325 MG PO TABS: 650.0000 mg | ORAL_TABLET | ORAL | Status: DC | PRN

## 2015-11-16 MED ORDER — FENTANYL CITRATE (PF) 100 MCG/2ML IJ SOLN
INTRAMUSCULAR | Status: DC | PRN
  Administered 2015-11-16: 50 ug via INTRAVENOUS

## 2015-11-16 MED ORDER — ONDANSETRON HCL 4 MG/2ML IJ SOLN: 4.0000 mg | Freq: Four times a day (QID) | INTRAMUSCULAR | Status: DC | PRN

## 2015-11-16 MED ORDER — FENTANYL CITRATE (PF) 100 MCG/2ML IJ SOLN
INTRAMUSCULAR | Status: AC
  Filled 2015-11-16: qty 2

## 2015-11-16 SURGICAL SUPPLY — 14 items
CATH INFINITI 5 FR JL3.5 (CATHETERS) ×2 IMPLANT
CATH INFINITI 5FR ANG PIGTAIL (CATHETERS) ×2 IMPLANT
CATH INFINITI JR4 5F (CATHETERS) ×2 IMPLANT
COVER PRB 48X5XTLSCP FOLD TPE (BAG) ×1 IMPLANT
COVER PROBE 5X48 (BAG) ×1
DEVICE RAD COMP TR BAND LRG (VASCULAR PRODUCTS) ×2 IMPLANT
GLIDESHEATH SLEND A-KIT 6F 22G (SHEATH) ×2 IMPLANT
KIT HEART LEFT (KITS) ×2 IMPLANT
PACK CARDIAC CATHETERIZATION (CUSTOM PROCEDURE TRAY) ×2 IMPLANT
SYR MEDRAD MARK V 150ML (SYRINGE) ×2 IMPLANT
TRANSDUCER W/STOPCOCK (MISCELLANEOUS) ×2 IMPLANT
TUBING CIL FLEX 10 FLL-RA (TUBING) ×2 IMPLANT
WIRE HI TORQ VERSACORE-J 145CM (WIRE) ×2 IMPLANT
WIRE SAFE-T 1.5MM-J .035X260CM (WIRE) ×2 IMPLANT

## 2015-11-16 NOTE — Discharge Instructions (Signed)
NO METFORMIN/GLUCOPHAGE FOR 2 DAYS ° ° ° °Radial Site Care °Refer to this sheet in the next few weeks. These instructions provide you with information about caring for yourself after your procedure. Your health care provider may also give you more specific instructions. Your treatment has been planned according to current medical practices, but problems sometimes occur. Call your health care provider if you have any problems or questions after your procedure. °WHAT TO EXPECT AFTER THE PROCEDURE °After your procedure, it is typical to have the following: °· Bruising at the radial site that usually fades within 1-2 weeks. °· Blood collecting in the tissue (hematoma) that may be painful to the touch. It should usually decrease in size and tenderness within 1-2 weeks. °HOME CARE INSTRUCTIONS °· Take medicines only as directed by your health care provider. °· You may shower 24-48 hours after the procedure or as directed by your health care provider. Remove the bandage (dressing) and gently wash the site with plain soap and water. Pat the area dry with a clean towel. Do not rub the site, because this may cause bleeding. °· Do not take baths, swim, or use a hot tub until your health care provider approves. °· Check your insertion site every day for redness, swelling, or drainage. °· Do not apply powder or lotion to the site. °· Do not flex or bend the affected arm for 24 hours or as directed by your health care provider. °· Do not push or pull heavy objects with the affected arm for 24 hours or as directed by your health care provider. °· Do not lift over 10 lb (4.5 kg) for 5 days after your procedure or as directed by your health care provider. °· Ask your health care provider when it is okay to: °¨ Return to work or school. °¨ Resume usual physical activities or sports. °¨ Resume sexual activity. °· Do not drive home if you are discharged the same day as the procedure. Have someone else drive you. °· You may drive 24  hours after the procedure unless otherwise instructed by your health care provider. °· Do not operate machinery or power tools for 24 hours after the procedure. °· If your procedure was done as an outpatient procedure, which means that you went home the same day as your procedure, a responsible adult should be with you for the first 24 hours after you arrive home. °· Keep all follow-up visits as directed by your health care provider. This is important. °SEEK MEDICAL CARE IF: °· You have a fever. °· You have chills. °· You have increased bleeding from the radial site. Hold pressure on the site. °SEEK IMMEDIATE MEDICAL CARE IF: °· You have unusual pain at the radial site. °· You have redness, warmth, or swelling at the radial site. °· You have drainage (other than a small amount of blood on the dressing) from the radial site. °· The radial site is bleeding, and the bleeding does not stop after 30 minutes of holding steady pressure on the site. °· Your arm or hand becomes pale, cool, tingly, or numb. °  °This information is not intended to replace advice given to you by your health care provider. Make sure you discuss any questions you have with your health care provider. °  °Document Released: 08/30/2010 Document Revised: 08/18/2014 Document Reviewed: 02/13/2014 °Elsevier Interactive Patient Education ©2016 [employer] ° °

## 2015-11-16 NOTE — H&P (View-Only) (Signed)
Cardiology Office Note    Date:  11/16/2015   ID:  [name], DOB Jan 19, 1956, MRN [id number]  PCP:  Minerva Ends, MD  Cardiologist:  Sueanne Margarita, MD   Chief Complaint  Patient presents with  . Chest Pain    pt states he have chest pain everyday feels like tight sharp pains with SOB    History of Present Illness:  Marco [name] is a 60 y.o. male history of chest pain dating back to 2014 at which time he underwent diagnostic catheterization in [place] revealing normal coronary arteries. During the same admission, he apparently had atrial fibrillation subsequent underwent A. fib ablation. He was on Xarelto for some period of time but this was subsequently discontinued secondary to anemia with subsequent finding of a large colon polyp and also gastric ulcer. He then underwent partial colectomy. He remained anemic following colectomy and at some point was followed by hematology. Since 2014, he has had intermittent chest discomfort and has also been treated for rheumatoid arthritis with diffuse body aches and tenderness. He says he has been on multiple rheumatologic medications without any significant improvement in his symptoms. Regarding chest pain, he had a low risk stress test in February 2016. Now lives in [place]. He was admitted to [facility] in May with chest discomfort and a mild but flat troponin elevation. CT angios of the chest was negative for PE. Echo showed normal LV function with grade 1 diastolic dysfunction and no evidence of effusion. Chest pain was felt to be potentially secondary to pericarditis. MRI was recommended however patient could not  lie in the MRI machine due to claustrophobia. He was empirically placed on colchicine.  He was last seen by my NP complaining of freq episodes of chest pain associated with SOB. He underwent a nuclear stress test showing mild basal inferolateral ischemia.  He subsequently underwent coronary CTA which showed a  coronary calcium score of 181. This was 14 percentile for age and sex matched control. Normal coronary origin. Right dominance.There is mild plaque in the RCA and moderate diffuse non-obstructive plaque in the LAD/diagonal branches. He comes in today complaining of sharp pain in his chest in the midsternal area with no radiation.  It is much worse with deep breathing.  He says that when he gets it he gets very SOB.  He says that currently it is a 7/10 in severity.  It will last up to 20 minutes at a time.  It is improved when he takes his Vicodin.  The pain will completely go away but then comes back.  It is exertional and nonexertional.  He denies any  Irregular heart beat.  He occasionally feels dizzy.  He also has problems with LE edema.      Past Medical History  Diagnosis Date  . Non-obstructive CAD     a. 02/2013 Cath ([place]): nonobs dzs;  b. 09/2014 Myoview ([place]): EF 55%, no ischemia;  c. 12/2014 Echo: EF 50-55%, gr1 DD, no effusion.  . Essential hypertension   . Diabetes mellitus without complication ([place])   . Rheumatoid arthritis ([place])   . Chronic pain   . PAF (paroxysmal atrial fibrillation) ([place])     a. 02/2013 s/p rfca in [place], [place]-->prev on Xarelto, d/c'd 2/2 anemia, ? GIB.  [name] Benign colon polyp 08/01/2013    [facility] in [place]. large base tranverse colon polyp was biopsied. polyp was benign with minimal surface hyperplastic change.  . Morbid obesity (  [place])   . Gastritis 07/29/2013    confirmed on EGD, bx done an negative for intestinal metaplasia, dsyplasia or H. pylori. normal gastric emptying study done 07/13/2013.  [name] Esophageal hiatal hernia 07/29/2013    confirmed on EGD   . Sigmoid diverticulosis 08/01/2013    confirmed on colonscopy. record scanned into chart  . GERD (gastroesophageal reflux disease)   . Esophageal stricture   . Diverticulosis     Past Surgical History  Procedure Laterality Date  . Colon surgery  09/2014    colon resection   . Cardiac  catheterization  05/2014    ablation for atrial fibrillation  . Colon resection  09/2013    due to large, abnormal polpy. non cancerous per patient.     Current Medications: Outpatient Prescriptions Prior to Visit  Medication Sig Dispense Refill  . ACCU-CHEK FASTCLIX LANCETS MISC 1 each by Other route 3 (three) times daily. 102 each 11  . allopurinol (ZYLOPRIM) 100 MG tablet Take 1 tablet (100 mg total) by mouth daily. 30 tablet 1  . ASPIRIN LOW DOSE 81 MG EC tablet TAKE ONE TABLET BY MOUTH ONCE DAILY 90 tablet 0  . atorvastatin (LIPITOR) 40 MG tablet TAKE ONE TABLET BY MOUTH ONCE DAILY 90 tablet 1  . Blood Glucose Monitoring Suppl (ACCU-CHEK AVIVA PLUS) W/DEVICE KIT 1 Device by Does not apply route 3 (three) times daily after meals. E11.9 1 kit 0  . Blood Glucose Monitoring Suppl (ACCU-CHEK AVIVA PLUS) W/DEVICE KIT Used as directed 1 kit 0  . carvedilol (COREG) 12.5 MG tablet Take 1 tablet (12.5 mg total) by mouth 2 (two) times daily. 60 tablet 2  . diltiazem (DILACOR XR) 180 MG 24 hr capsule TAKE ONE CAPSULE BY MOUTH ONCE DAILY 30 capsule 3  . furosemide (LASIX) 40 MG tablet Take 1 tablet (40 mg total) by mouth daily. 30 tablet 0  . glucose blood (ACCU-CHEK AVIVA PLUS) test strip 1 each by Other route 3 (three) times daily. E11.9 100 each 12  . glucose blood test strip Use as instructed 100 each 12  . glucose blood test strip Use as instructed 100 each 12  . HYDROcodone-acetaminophen (NORCO/VICODIN) 5-325 MG tablet Take 1 tablet by mouth every 8 (eight) hours as needed for moderate pain. Per pain management    . insulin aspart (NOVOLOG) 100 UNIT/ML injection Inject 14-24 Units into the skin 3 (three) times daily with meals. Reported on 09/20/2015    . Insulin Detemir (LEVEMIR FLEXPEN) 100 UNIT/ML Pen Inject 60 Units into the skin daily at 10 pm. 15 mL 11  . Insulin Pen Needle (BD PEN NEEDLE NANO U/F) 32G X 4 MM MISC 1 each by Does not apply route 3 (three) times daily. 100 each 11  . Lancet  Devices (ACCU-CHEK SOFTCLIX) lancets Use as instructed 1 each 11  . lisinopril (PRINIVIL,ZESTRIL) 30 MG tablet Take 1 tablet (30 mg total) by mouth daily. 30 tablet 3  . metFORMIN (GLUCOPHAGE-XR) 500 MG 24 hr tablet Take 500 mg by mouth.    . pantoprazole (PROTONIX) 40 MG tablet Take 1 tablet (40 mg total) by mouth daily. 60 tablet 4  . predniSONE (DELTASONE) 10 MG tablet Take 1 tablet (10 mg total) by mouth daily with breakfast. 30 tablet 1  . ranitidine (ZANTAC) 150 MG capsule Take 1 capsule (150 mg total) by mouth daily. 60 capsule 0  . zolpidem (AMBIEN) 5 MG tablet Take 1 tablet (5 mg total) by mouth at bedtime as needed for sleep. [place]  tablet 0   No facility-administered medications prior to visit.     Allergies:   Review of patient's allergies indicates no known allergies.   Social History   Social History  . Marital Status: Married    Spouse Name: N/A  . Number of Children: N/A  . Years of Education: N/A   Social History Main Topics  . Smoking status: Never Smoker   . Smokeless tobacco: Never Used  . Alcohol Use: No  . Drug Use: No  . Sexual Activity: Not Asked   Other Topics Concern  . None   Social History Narrative     Family History:  The patient's family history includes COPD in his father and mother; Cancer in his mother; Diabetes in his father and mother; Heart Problems in his father; Hypertension in his father and mother.   ROS:   Please see the history of present illness.    ROS All other systems reviewed and are negative.   PHYSICAL EXAM:   VS:  BP 182/96 mmHg  Pulse 77  Ht 5' 6" (1.676 m)  Wt 216 lb 1.9 oz (98.031 kg)  BMI 34.90 kg/m2   GEN: Well nourished, well developed, in no acute distress HEENT: normal Neck: no JVD, carotid bruits, or masses Cardiac: RRR; no murmurs, rubs, or gallops,no edema.  Intact distal pulses bilaterally.  Respiratory:  clear to auscultation bilaterally, normal work of breathing GI: soft, nontender, nondistended, +  BS MS: no deformity or atrophy Skin: warm and dry, no rash Neuro:  Alert and Oriented x 3, Strength and sensation are intact Psych: euthymic mood, full affect  Wt Readings from Last 3 Encounters:  11/16/15 216 lb 1.9 oz (98.031 kg)  11/13/15 219 lb (99.338 kg)  11/13/15 219 lb 3.2 oz (99.428 kg)      Studies/Labs Reviewed:   EKG:  EKG was rdered today and showed NSR with moderate LVH with repolarization abnormality  Recent Labs: 12/23/2014: TSH 0.757 03/16/2015: ALT 28 09/20/2015: B Natriuretic Peptide 33.3; BUN 10; Creatinine, Ser 1.10; Hemoglobin 13.6; Platelets 233; Potassium 3.2*; Sodium 142   Lipid Panel    Component Value Date/Time   CHOL 127 12/23/2014 2149   TRIG 192* 12/23/2014 2149   HDL 34* 12/23/2014 2149   CHOLHDL 3.7 12/23/2014 2149   VLDL 38 12/23/2014 2149   [place] 55 12/23/2014 2149    Additional studies/ records that were reviewed today include:  Coronary CTA and nuclear stress test    ASSESSMENT:    1. Coronary artery disease due to lipid rich plaque   2. PAF (paroxysmal atrial fibrillation) ([place])   3. Essential hypertension   4. HLD (hyperlipidemia)      PLAN:  In order of problems listed above:  1.  ASCAD with nonobstructive disease by coronary CTA and increased calcium score.  He is having chest pain that has gotten progressively worse.  His CP is somewhat atypical in that it is sharp but it also has a pressure component that is worse with exertion.  He does have moderate CAD by coronary CTA and I am concerned that it may be worse than what is on CTA.  I have recommended sending to ER for admission and then plan left heart cath today.  If cath is negative he should have an echo to rule out pericardial effusion.  Cardiac catheterization was discussed with the patient fully. The patient understands that risks include but are not limited to stroke (1 in 1000), death (1 in 42), kidney failure [  usually temporary] (1 in 500), bleeding (1 in 200),  allergic reaction [possibly serious] (1 in 200).  The patient understands and is willing to proceed.   Continue ASA/statin/BB. 2.  PAF maintaining NSR on BB/CCB.  His CHADS2VASC score is 3.  He is s/p remote RFA in 2014 in [place] and had been on Xarelto but stopped due to GI bleed remotely and was not started back due to ablation with no reocurrence.    3.  HTN - poorly controlled on current regimen.  ? Whether pain is driving BP.  Continue BB/CCB/ACE I.  May need adjusting in hospital.   4.  Hyperlipidemia - continue statin.  Check FLP and ALT.   Medication Adjustments/Labs and Tests Ordered: Current medicines are reviewed at length with the patient today.  Concerns regarding medicines are outlined above.  Medication changes, Labs and Tests ordered today are listed in the Patient Instructions below. There are no Patient Instructions on file for this visit.   Lurena Nida, MD  11/16/2015 9:54 AM    [facility] [address], [place], [place]  [address] Phone: [phone]; Fax: [phone]

## 2015-11-16 NOTE — ED Provider Notes (Signed)
MSE was initiated and I personally evaluated the patient and placed orders (if any) at  11:54 AM on November 16, 2015.  The patient appears stable so that the remainder of the MSE may be completed by another provider.   Subjective: London Friloux is a(n) 60 y.o. male who presents 4. Chest pain. He is sent by his cardiologist to go directly to the cath lab. The patient states that he has had months of intermittent retrosternal chest pain. It is nonexertional, but occasionally associated with shortness of breath and nausea. Currently, he rates his pain at a 7 out of 10.He denies associated symptoms such as shortness of breath, nausea, diaphoresis. He has a past medical history of diabetes, hypercholesterolemia,ypertension, paroxysmal atrial fibrillation and reflux.  Objective: Well-developed, well-nourished male in no acute distress Heent: NORMOCEPHALIC AND ATRAUMATIC,  EOMI, normal conjuctiva Cardiac: RRR, No MGR, BL LE edema without pitting Lungs: CTAB Abd: soft, nontender, no distention Skin: warm and dry MSK: no joint swelling or pain PSych: normal behavior   A/P: Patient with chest pain. I've spoken withTrish the cardiology Master. She requests labs be drawn. Patient is scheduled to go to the cath lab this afternoon.    EKG Interpretation  Date/Time:  Friday November 16 2015 11:27:43 EDT Ventricular Rate:  65 PR Interval:  129 QRS Duration: 99 QT Interval:  437 QTC Calculation: 454 R Axis:   3 Text Interpretation:  Sinus rhythm Low voltage, precordial leads LVH with secondary repolarization abnormality No significant change since last tracing Confirmed by Alvino Chapel  MD, Ovid Curd [phone]) on 11/16/2015 12:07:05 PM        Patient taken to cath lab. Pt stable in ED with no significant deterioration in condition.   Margarita Mail, [name] 11/16/15 [place], MD 11/26/15 1145

## 2015-11-16 NOTE — Interval H&P Note (Signed)
History and Physical Interval Note:  11/16/2015 1:56 PM  Marco [name]  has presented today for surgery, with the diagnosis of chest pain - concerning for Unstable Angina.    The various methods of treatment have been discussed with the patient and family. After consideration of risks, benefits and other options for treatment, the patient has consented to  Procedure(s): Left Heart Cath and Coronary Angiography (N/A) as a surgical intervention .  The patient's history has been reviewed, patient examined, no change in status, stable for surgery.  I have reviewed the patient's chart and labs.  Questions were answered to the patient's satisfaction.    Cath Lab Visit (complete for each Cath Lab visit)  Clinical Evaluation Leading to the Procedure:   ACS: Yes.    Non-ACS:    Anginal Classification: CCS IV  Anti-ischemic medical therapy: Maximal Therapy (2 or more classes of medications)  Non-Invasive Test Results: No non-invasive testing performed  Prior CABG: No previous CABG  TIMI SCORE  Patient Information:  TIMI Score is 4  UA/NSTEMI and intermediate-risk features (e.g., TIMI score 3?4) for short-term risk of death or nonfatal MI  Revascularization of the presumed culprit artery   A (8)  Indication: 10; Score: 8    HARDING, DAVID W

## 2015-11-16 NOTE — ED Notes (Signed)
Pt presents via [facility] from [facility] office.  Pt has an appt this am for intermittent CP x 2 weeks on and off with associated SOB.  Pt with hx: CAS, HTN, DM, PAF.  Sent over from MD office for [facility] today per MD Turner.  Pt denies pain at current, also reports taking Vicodin at 0930 this AM.  Pt a x 4, NAD.  BP-131/90 P-65 NSR. O2-98%Ra.  CBG-174.  20 g LAC.

## 2015-11-16 NOTE — Progress Notes (Signed)
 Cardiology Office Note    Date:  11/16/2015   ID:  Marco [name], DOB 07/29/1956, MRN [id number]  PCP:  FUNCHES, JOSALYN C, MD  Cardiologist:  TURNER,TRACI R, MD   Chief Complaint  Patient presents with  . Chest Pain    pt states he have chest pain everyday feels like tight sharp pains with SOB    History of Present Illness:  Cabell Tolliver is a 59 y.o. male history of chest pain dating back to 2014 at which time he underwent diagnostic catheterization in [place] revealing normal coronary arteries. During the same admission, he apparently had atrial fibrillation subsequent underwent A. fib ablation. He was on Xarelto for some period of time but this was subsequently discontinued secondary to anemia with subsequent finding of a large colon polyp and also gastric ulcer. He then underwent partial colectomy. He remained anemic following colectomy and at some point was followed by hematology. Since 2014, he has had intermittent chest discomfort and has also been treated for rheumatoid arthritis with diffuse body aches and tenderness. He says he has been on multiple rheumatologic medications without any significant improvement in his symptoms. Regarding chest pain, he had a low risk stress test in February 2016. Now lives in [place]. He was admitted to [facility] in May with chest discomfort and a mild but flat troponin elevation. CT angios of the chest was negative for PE. Echo showed normal LV function with grade 1 diastolic dysfunction and no evidence of effusion. Chest pain was felt to be potentially secondary to pericarditis. MRI was recommended however patient could not  lie in the MRI machine due to claustrophobia. He was empirically placed on colchicine.  He was last seen by my NP complaining of freq episodes of chest pain associated with SOB. He underwent a nuclear stress test showing mild basal inferolateral ischemia.  He subsequently underwent coronary CTA which showed a  coronary calcium score of 181. This was 70 percentile for age and sex matched control. Normal coronary origin. Right dominance.There is mild plaque in the RCA and moderate diffuse non-obstructive plaque in the LAD/diagonal branches. He comes in today complaining of sharp pain in his chest in the midsternal area with no radiation.  It is much worse with deep breathing.  He says that when he gets it he gets very SOB.  He says that currently it is a 7/10 in severity.  It will last up to 20 minutes at a time.  It is improved when he takes his Vicodin.  The pain will completely go away but then comes back.  It is exertional and nonexertional.  He denies any  Irregular heart beat.  He occasionally feels dizzy.  He also has problems with LE edema.      Past Medical History  Diagnosis Date  . Non-obstructive CAD     a. 02/2013 Cath ([place]): nonobs dzs;  b. 09/2014 Myoview ([place]): EF 55%, no ischemia;  c. 12/2014 Echo: EF 50-55%, gr1 DD, no effusion.  . Essential hypertension   . Diabetes mellitus without complication (HCC)   . Rheumatoid arthritis (HCC)   . Chronic pain   . PAF (paroxysmal atrial fibrillation) (HCC)     a. 02/2013 s/p rfca in [place]-->prev on Xarelto, d/c'd 2/2 anemia, ? GIB.  . Benign colon polyp 08/01/2013    [facility] in [place]. large base tranverse colon polyp was biopsied. polyp was benign with minimal surface hyperplastic change.  . Morbid obesity (  HCC)   . Gastritis 07/29/2013    confirmed on EGD, bx done an negative for intestinal metaplasia, dsyplasia or H. pylori. normal gastric emptying study done 07/13/2013.  . Esophageal hiatal hernia 07/29/2013    confirmed on EGD   . Sigmoid diverticulosis 08/01/2013    confirmed on colonscopy. record scanned into chart  . GERD (gastroesophageal reflux disease)   . Esophageal stricture   . Diverticulosis     Past Surgical History  Procedure Laterality Date  . Colon surgery  09/2014    colon resection   . Cardiac  catheterization  05/2014    ablation for atrial fibrillation  . Colon resection  09/2013    due to large, abnormal polpy. non cancerous per patient.     Current Medications: Outpatient Prescriptions Prior to Visit  Medication Sig Dispense Refill  . ACCU-CHEK FASTCLIX LANCETS MISC 1 each by Other route 3 (three) times daily. 102 each 11  . allopurinol (ZYLOPRIM) 100 MG tablet Take 1 tablet (100 mg total) by mouth daily. 30 tablet 1  . ASPIRIN LOW DOSE 81 MG EC tablet TAKE ONE TABLET BY MOUTH ONCE DAILY 90 tablet 0  . atorvastatin (LIPITOR) 40 MG tablet TAKE ONE TABLET BY MOUTH ONCE DAILY 90 tablet 1  . Blood Glucose Monitoring Suppl (ACCU-CHEK AVIVA PLUS) W/DEVICE KIT 1 Device by Does not apply route 3 (three) times daily after meals. E11.9 1 kit 0  . Blood Glucose Monitoring Suppl (ACCU-CHEK AVIVA PLUS) W/DEVICE KIT Used as directed 1 kit 0  . carvedilol (COREG) 12.5 MG tablet Take 1 tablet (12.5 mg total) by mouth 2 (two) times daily. 60 tablet 2  . diltiazem (DILACOR XR) 180 MG 24 hr capsule TAKE ONE CAPSULE BY MOUTH ONCE DAILY 30 capsule 3  . furosemide (LASIX) 40 MG tablet Take 1 tablet (40 mg total) by mouth daily. 30 tablet 0  . glucose blood (ACCU-CHEK AVIVA PLUS) test strip 1 each by Other route 3 (three) times daily. E11.9 100 each 12  . glucose blood test strip Use as instructed 100 each 12  . glucose blood test strip Use as instructed 100 each 12  . HYDROcodone-acetaminophen (NORCO/VICODIN) 5-325 MG tablet Take 1 tablet by mouth every 8 (eight) hours as needed for moderate pain. Per pain management    . insulin aspart (NOVOLOG) 100 UNIT/ML injection Inject 14-24 Units into the skin 3 (three) times daily with meals. Reported on 09/20/2015    . Insulin Detemir (LEVEMIR FLEXPEN) 100 UNIT/ML Pen Inject 60 Units into the skin daily at 10 pm. 15 mL 11  . Insulin Pen Needle (BD PEN NEEDLE NANO U/F) 32G X 4 MM MISC 1 each by Does not apply route 3 (three) times daily. 100 each 11  . Lancet  Devices (ACCU-CHEK SOFTCLIX) lancets Use as instructed 1 each 11  . lisinopril (PRINIVIL,ZESTRIL) 30 MG tablet Take 1 tablet (30 mg total) by mouth daily. 30 tablet 3  . metFORMIN (GLUCOPHAGE-XR) 500 MG 24 hr tablet Take 500 mg by mouth.    . pantoprazole (PROTONIX) 40 MG tablet Take 1 tablet (40 mg total) by mouth daily. 60 tablet 4  . predniSONE (DELTASONE) 10 MG tablet Take 1 tablet (10 mg total) by mouth daily with breakfast. 30 tablet 1  . ranitidine (ZANTAC) 150 MG capsule Take 1 capsule (150 mg total) by mouth daily. 60 capsule 0  . zolpidem (AMBIEN) 5 MG tablet Take 1 tablet (5 mg total) by mouth at bedtime as needed for sleep. 30   tablet 0   No facility-administered medications prior to visit.     Allergies:   Review of patient's allergies indicates no known allergies.   Social History   Social History  . Marital Status: Married    Spouse Name: N/A  . Number of Children: N/A  . Years of Education: N/A   Social History Main Topics  . Smoking status: Never Smoker   . Smokeless tobacco: Never Used  . Alcohol Use: No  . Drug Use: No  . Sexual Activity: Not Asked   Other Topics Concern  . None   Social History Narrative     Family History:  The patient's family history includes COPD in his father and mother; Cancer in his mother; Diabetes in his father and mother; Heart Problems in his father; Hypertension in his father and mother.   ROS:   Please see the history of present illness.    ROS All other systems reviewed and are negative.   PHYSICAL EXAM:   VS:  BP 182/96 mmHg  Pulse 77  Ht 5' 6" (1.676 m)  Wt 216 lb 1.9 oz (98.031 kg)  BMI 34.90 kg/m2   GEN: Well nourished, well developed, in no acute distress HEENT: normal Neck: no JVD, carotid bruits, or masses Cardiac: RRR; no murmurs, rubs, or gallops,no edema.  Intact distal pulses bilaterally.  Respiratory:  clear to auscultation bilaterally, normal work of breathing GI: soft, nontender, nondistended, +  BS MS: no deformity or atrophy Skin: warm and dry, no rash Neuro:  Alert and Oriented x 3, Strength and sensation are intact Psych: euthymic mood, full affect  Wt Readings from Last 3 Encounters:  11/16/15 216 lb 1.9 oz (98.031 kg)  11/13/15 219 lb (99.338 kg)  11/13/15 219 lb 3.2 oz (99.428 kg)      Studies/Labs Reviewed:   EKG:  EKG was rdered today and showed NSR with moderate LVH with repolarization abnormality  Recent Labs: 12/23/2014: TSH 0.757 03/16/2015: ALT 28 09/20/2015: B Natriuretic Peptide 33.3; BUN 10; Creatinine, Ser 1.10; Hemoglobin 13.6; Platelets 233; Potassium 3.2*; Sodium 142   Lipid Panel    Component Value Date/Time   CHOL 127 12/23/2014 2149   TRIG 192* 12/23/2014 2149   HDL 34* 12/23/2014 2149   CHOLHDL 3.7 12/23/2014 2149   VLDL 38 12/23/2014 2149   [place] 55 12/23/2014 2149    Additional studies/ records that were reviewed today include:  Coronary CTA and nuclear stress test    ASSESSMENT:    1. Coronary artery disease due to lipid rich plaque   2. PAF (paroxysmal atrial fibrillation) ([place])   3. Essential hypertension   4. HLD (hyperlipidemia)      PLAN:  In order of problems listed above:  1.  ASCAD with nonobstructive disease by coronary CTA and increased calcium score.  He is having chest pain that has gotten progressively worse.  His CP is somewhat atypical in that it is sharp but it also has a pressure component that is worse with exertion.  He does have moderate CAD by coronary CTA and I am concerned that it may be worse than what is on CTA.  I have recommended sending to ER for admission and then plan left heart cath today.  If cath is negative he should have an echo to rule out pericardial effusion.  Cardiac catheterization was discussed with the patient fully. The patient understands that risks include but are not limited to stroke (1 in 1000), death (1 in 46), kidney failure [  usually temporary] (1 in 500), bleeding (1 in 200),  allergic reaction [possibly serious] (1 in 200).  The patient understands and is willing to proceed.   Continue ASA/statin/BB. 2.  PAF maintaining NSR on BB/CCB.  His CHADS2VASC score is 3.  He is s/p remote RFA in 2014 in [place] and had been on Xarelto but stopped due to GI bleed remotely and was not started back due to ablation with no reocurrence.    3.  HTN - poorly controlled on current regimen.  ? Whether pain is driving BP.  Continue BB/CCB/ACE I.  May need adjusting in hospital.   4.  Hyperlipidemia - continue statin.  Check FLP and ALT.   Medication Adjustments/Labs and Tests Ordered: Current medicines are reviewed at length with the patient today.  Concerns regarding medicines are outlined above.  Medication changes, Labs and Tests ordered today are listed in the Patient Instructions below. There are no Patient Instructions on file for this visit.   Signed, TURNER,TRACI R, MD  11/16/2015 9:54 AM    [facility] [address], [place], [place]  [address] Phone: [phone]; Fax: [phone]    

## 2015-11-19 ENCOUNTER — Encounter (HOSPITAL_COMMUNITY): Payer: Self-pay | Admitting: Cardiology

## 2015-11-21 NOTE — Addendum Note (Signed)
Addended by: Freada Bergeron on: 11/21/2015 11:29 AM   Modules accepted: Orders

## 2015-11-24 ENCOUNTER — Other Ambulatory Visit: Payer: Self-pay | Admitting: Family Medicine

## 2015-11-27 DIAGNOSIS — L4050 Arthropathic psoriasis, unspecified: Secondary | ICD-10-CM | POA: Insufficient documentation

## 2015-11-27 DIAGNOSIS — L409 Psoriasis, unspecified: Secondary | ICD-10-CM

## 2015-11-27 DIAGNOSIS — M545 Low back pain, unspecified: Secondary | ICD-10-CM | POA: Insufficient documentation

## 2015-11-27 DIAGNOSIS — M2550 Pain in unspecified joint: Secondary | ICD-10-CM | POA: Insufficient documentation

## 2015-11-27 DIAGNOSIS — Z79899 Other long term (current) drug therapy: Secondary | ICD-10-CM | POA: Insufficient documentation

## 2015-11-29 ENCOUNTER — Other Ambulatory Visit: Payer: Self-pay | Admitting: *Deleted

## 2015-11-29 DIAGNOSIS — M1A00X0 Idiopathic chronic gout, unspecified site, without tophus (tophi): Secondary | ICD-10-CM | POA: Insufficient documentation

## 2015-11-29 DIAGNOSIS — M1A9XX0 Chronic gout, unspecified, without tophus (tophi): Secondary | ICD-10-CM | POA: Insufficient documentation

## 2015-11-29 MED ORDER — FUROSEMIDE 40 MG PO TABS: 40.0000 mg | ORAL_TABLET | Freq: Every day | ORAL | Status: DC

## 2015-12-10 ENCOUNTER — Ambulatory Visit: Payer: Medicaid Other | Admitting: Sports Medicine

## 2015-12-10 ENCOUNTER — Other Ambulatory Visit: Payer: Self-pay | Admitting: Family Medicine

## 2015-12-10 ENCOUNTER — Encounter: Payer: Self-pay | Admitting: Family Medicine

## 2015-12-17 ENCOUNTER — Encounter: Payer: Self-pay | Admitting: Sports Medicine

## 2015-12-17 ENCOUNTER — Ambulatory Visit (INDEPENDENT_AMBULATORY_CARE_PROVIDER_SITE_OTHER): Payer: Medicaid Other | Admitting: Sports Medicine

## 2015-12-17 DIAGNOSIS — E1142 Type 2 diabetes mellitus with diabetic polyneuropathy: Secondary | ICD-10-CM | POA: Diagnosis not present

## 2015-12-17 DIAGNOSIS — B351 Tinea unguium: Secondary | ICD-10-CM

## 2015-12-17 DIAGNOSIS — M79673 Pain in unspecified foot: Secondary | ICD-10-CM

## 2015-12-17 MED ORDER — CAPSAICIN 0.1 % EX CREA: 1.0000 "application " | TOPICAL_CREAM | Freq: Once | CUTANEOUS | Status: DC

## 2015-12-17 NOTE — Progress Notes (Signed)
Patient ID: Marco [name], male   DOB: 03/16/1956, 60 y.o.   MRN: [id number] Subjective: Marco [name] is a 60 y.o. male patient with history of diabetes who presents to office today complaining of long, painful nails  while ambulating in shoes; unable to trim. Patient states that the glucose reading this morning was '151mg'$ /dl. Patient denies any new changes in medication or new problems. Patient denies any new cramping, numbness, burning or tingling in the legs. States that his pain management doctor stopped his Lyrica but does not know why.   Patient Active Problem List   Diagnosis Date Noted  . Chronic gouty arthritis 11/29/2015  . Ache in joint 11/27/2015  . Other long term (current) drug therapy 11/27/2015  . LBP (low back pain) 11/27/2015  . Arthralgia of multiple joints 11/27/2015  . Psoriasis 11/27/2015  . Unstable angina pectoris ([place])   . Coronary artery calcification seen on CAT scan   . Breast pain in male 11/14/2015  . Sinusitis, chronic 10/16/2015  . Insomnia 10/16/2015  . Combined fat and carbohydrate induced hyperlipemia 10/04/2015  . Adult BMI 30+ 10/04/2015  . Type 2 diabetes mellitus ([place]) 10/04/2015  . New onset of headaches after age 60 09/20/2015  . OSA (obstructive sleep apnea) 07/12/2015  . Joint pain 06/01/2015  . Low serum testosterone level 05/18/2015  . Depression 05/16/2015  . Morbid obesity ([place])   . Chronic pain   . Tongue discoloration 02/20/2015  . Rheumatoid arthritis ([place]) 02/05/2015  . Elevated troponin 12/24/2014  . CAD (coronary artery disease) 12/24/2014  . PAF (paroxysmal atrial fibrillation) ([place]) 12/24/2014  . Essential hypertension 12/24/2014  . HLD (hyperlipidemia) 12/24/2014  . CKD (chronic kidney disease) 12/24/2014  . PUD (peptic ulcer disease) 12/24/2014  . DM2 (diabetes mellitus, type 2) ([place]) 12/24/2014  . Chest pain 12/23/2014   Current Outpatient Prescriptions on File Prior to Visit  Medication Sig Dispense Refill  . ACCU-CHEK  FASTCLIX LANCETS MISC 1 each by Other route 3 (three) times daily. 102 each 11  . allopurinol (ZYLOPRIM) 100 MG tablet Take 1 tablet (100 mg total) by mouth daily. 30 tablet 1  . ASPIRIN LOW DOSE 81 MG EC tablet TAKE ONE TABLET BY MOUTH ONCE DAILY 90 tablet 0  . atorvastatin (LIPITOR) 40 MG tablet TAKE ONE TABLET BY MOUTH ONCE DAILY 90 tablet 1  . Blood Glucose Monitoring Suppl (ACCU-CHEK AVIVA PLUS) W/DEVICE KIT 1 Device by Does not apply route 3 (three) times daily after meals. E11.9 1 kit 0  . Blood Glucose Monitoring Suppl (ACCU-CHEK AVIVA PLUS) W/DEVICE KIT Used as directed 1 kit 0  . carvedilol (COREG) 12.5 MG tablet Take 1 tablet (12.5 mg total) by mouth 2 (two) times daily. 60 tablet 2  . diltiazem (DILACOR XR) 180 MG 24 hr capsule TAKE ONE CAPSULE BY MOUTH ONCE DAILY 30 capsule 2  . furosemide (LASIX) 40 MG tablet Take 1 tablet (40 mg total) by mouth daily. 30 tablet 0  . glucose blood (ACCU-CHEK AVIVA PLUS) test strip 1 each by Other route 3 (three) times daily. E11.9 100 each 12  . glucose blood test strip Use as instructed 100 each 12  . glucose blood test strip Use as instructed 100 each 12  . HYDROcodone-acetaminophen (NORCO/VICODIN) 5-325 MG tablet Take 1 tablet by mouth every 8 (eight) hours as needed for moderate pain. Per pain management    . insulin aspart (NOVOLOG) 100 UNIT/ML injection Inject 14-24 Units into the skin 3 (three) times daily with meals. Reported on  09/20/2015    . Insulin Detemir (LEVEMIR FLEXPEN) 100 UNIT/ML Pen Inject 60 Units into the skin daily at 10 pm. 15 mL 11  . Insulin Pen Needle (BD PEN NEEDLE NANO U/F) 32G X 4 MM MISC 1 each by Does not apply route 3 (three) times daily. 100 each 11  . Lancet Devices (ACCU-CHEK SOFTCLIX) lancets Use as instructed 1 each 11  . lisinopril (PRINIVIL,ZESTRIL) 30 MG tablet TAKE ONE TABLET BY MOUTH ONCE DAILY 30 tablet 3  . metFORMIN (GLUCOPHAGE-XR) 500 MG 24 hr tablet Take 500 mg by mouth 2 (two) times daily.     .  pantoprazole (PROTONIX) 40 MG tablet Take 40 mg by mouth 2 (two) times daily.  60 tablet 4  . predniSONE (DELTASONE) 10 MG tablet Take 1 tablet (10 mg total) by mouth daily with breakfast. 30 tablet 1  . pregabalin (LYRICA) 25 MG capsule Take 25 mg by mouth 2 (two) times daily.    . ranitidine (ZANTAC) 150 MG capsule Take 150 mg by mouth 2 (two) times daily.  60 capsule 0  . zolpidem (AMBIEN) 5 MG tablet Take 1 tablet (5 mg total) by mouth at bedtime as needed for sleep. 30 tablet 0   No current facility-administered medications on file prior to visit.   No Known Allergies  Recent Results (from the past 2160 hour(s))  Glucose (CBG)     Status: Abnormal   Collection Time: 09/20/15  9:59 AM  Result Value Ref Range   POC Glucose 223.0 (A) 70 - 99 mg/dl  HgB A1c     Status: None   Collection Time: 09/20/15 10:03 AM  Result Value Ref Range   Hemoglobin A1C 35.57   Basic metabolic panel     Status: Abnormal   Collection Time: 09/20/15 11:37 AM  Result Value Ref Range   Sodium 142 135 - 145 mmol/L   Potassium 3.2 (L) 3.5 - 5.1 mmol/L   Chloride 100 (L) 101 - 111 mmol/L   CO2 30 22 - 32 mmol/L   Glucose, Bld 225 (H) 65 - 99 mg/dL   BUN 10 6 - 20 mg/dL   Creatinine, Ser 1.10 0.61 - 1.24 mg/dL   Calcium 9.0 8.9 - 10.3 mg/dL   GFR calc non Af Amer >60 >60 mL/min   GFR calc Af Amer >60 >60 mL/min    Comment: (NOTE) The eGFR has been calculated using the CKD EPI equation. This calculation has not been validated in all clinical situations. eGFR's persistently <60 mL/min signify possible Chronic Kidney Disease.    Anion gap 12 5 - 15  CBC with Differential     Status: Abnormal   Collection Time: 09/20/15 11:37 AM  Result Value Ref Range   WBC 8.8 4.0 - 10.5 K/uL   RBC 4.51 4.22 - 5.81 MIL/uL   Hemoglobin 13.6 13.0 - 17.0 g/dL   HCT 41.6 39.0 - 52.0 %   MCV 92.2 78.0 - 100.0 fL   MCH 30.2 26.0 - 34.0 pg   MCHC 32.7 30.0 - 36.0 g/dL   RDW 14.5 11.5 - 15.5 %   Platelets 233 150 - 400  K/uL   Neutrophils Relative % 53 %   Lymphocytes Relative 33 %   Monocytes Relative 12 %   Eosinophils Relative 2 %   Basophils Relative 0 %   Neutro Abs 4.6 1.7 - 7.7 K/uL   Lymphs Abs 2.9 0.7 - 4.0 K/uL   Monocytes Absolute 1.1 (H) 0.1 - 1.0 K/uL  Eosinophils Absolute 0.2 0.0 - 0.7 K/uL   Basophils Absolute 0.0 0.0 - 0.1 K/uL   WBC Morphology ATYPICAL LYMPHOCYTES   Brain natriuretic peptide     Status: None   Collection Time: 09/20/15 11:37 AM  Result Value Ref Range   B Natriuretic Peptide 33.3 0.0 - 100.0 pg/mL  I-stat troponin, ED     Status: None   Collection Time: 09/20/15 11:49 AM  Result Value Ref Range   Troponin i, poc 0.05 0.00 - 0.08 ng/mL   Comment 3            Comment: Due to the release kinetics of cTnI, a negative result within the first hours of the onset of symptoms does not rule out myocardial infarction with certainty. If myocardial infarction is still suspected, repeat the test at appropriate intervals.   I-stat troponin, ED     Status: None   Collection Time: 09/20/15  3:32 PM  Result Value Ref Range   Troponin i, poc 0.06 0.00 - 0.08 ng/mL   Comment 3            Comment: Due to the release kinetics of cTnI, a negative result within the first hours of the onset of symptoms does not rule out myocardial infarction with certainty. If myocardial infarction is still suspected, repeat the test at appropriate intervals.   Myocardial Perfusion Imaging     Status: None   Collection Time: 10/11/15  9:59 AM  Result Value Ref Range   Rest HR 71 bpm   Rest BP 124/76 mmHg   Exercise duration (min)  min   Exercise duration (sec)  sec   Estimated workload  METS   Peak HR 87 bpm   Peak BP 116/81 mmHg   MPHR  bpm   Percent HR  %   RPE     LV sys vol 55 mL   TID 1.07    LV dias vol 102 mL   LHR 0.36    SSS 8    SRS 6    SDS 2   POCT glucose (manual entry)     Status: Abnormal   Collection Time: 11/13/15 10:14 AM  Result Value Ref Range   POC Glucose  173 (A) 70 - 99 mg/dl  I-stat troponin, ED     Status: None   Collection Time: 11/16/15 12:03 PM  Result Value Ref Range   Troponin i, poc 0.05 0.00 - 0.08 ng/mL   Comment 3            Comment: Due to the release kinetics of cTnI, a negative result within the first hours of the onset of symptoms does not rule out myocardial infarction with certainty. If myocardial infarction is still suspected, repeat the test at appropriate intervals.   CBC with Differential     Status: Abnormal   Collection Time: 11/16/15 12:04 PM  Result Value Ref Range   WBC 9.5 4.0 - 10.5 K/uL   RBC 4.73 4.22 - 5.81 MIL/uL   Hemoglobin 13.9 13.0 - 17.0 g/dL   HCT 40.7 68.0 - 88.1 %   MCV 93.0 78.0 - 100.0 fL   MCH 29.4 26.0 - 34.0 pg   MCHC 31.6 30.0 - 36.0 g/dL   RDW 10.3 15.9 - 45.8 %   Platelets 224 150 - 400 K/uL   Neutrophils Relative % 69 %   Neutro Abs 6.5 1.7 - 7.7 K/uL   Lymphocytes Relative 19 %   Lymphs Abs 1.8 0.7 - 4.0  K/uL   Monocytes Relative 11 %   Monocytes Absolute 1.1 (H) 0.1 - 1.0 K/uL   Eosinophils Relative 1 %   Eosinophils Absolute 0.1 0.0 - 0.7 K/uL   Basophils Relative 0 %   Basophils Absolute 0.0 0.0 - 0.1 K/uL  Basic metabolic panel     Status: Abnormal   Collection Time: 11/16/15 12:04 PM  Result Value Ref Range   Sodium 143 135 - 145 mmol/L   Potassium 3.4 (L) 3.5 - 5.1 mmol/L   Chloride 102 101 - 111 mmol/L   CO2 26 22 - 32 mmol/L   Glucose, Bld 162 (H) 65 - 99 mg/dL   BUN 11 6 - 20 mg/dL   Creatinine, Ser 1.23 0.61 - 1.24 mg/dL   Calcium 9.5 8.9 - 10.3 mg/dL   GFR calc non Af Amer >60 >60 mL/min   GFR calc Af Amer >60 >60 mL/min    Comment: (NOTE) The eGFR has been calculated using the CKD EPI equation. This calculation has not been validated in all clinical situations. eGFR's persistently <60 mL/min signify possible Chronic Kidney Disease.    Anion gap 15 5 - 15  Protime-INR     Status: None   Collection Time: 11/16/15 12:04 PM  Result Value Ref Range    Prothrombin Time 14.0 11.6 - 15.2 seconds   INR 1.06 0.00 - 1.49  Urinalysis, Routine w reflex microscopic (not at [facility])     Status: None   Collection Time: 11/16/15 12:11 PM  Result Value Ref Range   Color, Urine YELLOW YELLOW   APPearance CLEAR CLEAR   Specific Gravity, Urine 1.008 1.005 - 1.030   pH 5.0 5.0 - 8.0   Glucose, UA NEGATIVE NEGATIVE mg/dL   Hgb urine dipstick NEGATIVE NEGATIVE   Bilirubin Urine NEGATIVE NEGATIVE   Ketones, ur NEGATIVE NEGATIVE mg/dL   Protein, ur NEGATIVE NEGATIVE mg/dL   Nitrite NEGATIVE NEGATIVE   Leukocytes, UA NEGATIVE NEGATIVE    Comment: MICROSCOPIC NOT DONE ON URINES WITH NEGATIVE PROTEIN, BLOOD, LEUKOCYTES, NITRITE, OR GLUCOSE <1000 mg/dL.  Glucose, capillary     Status: Abnormal   Collection Time: 11/16/15  3:20 PM  Result Value Ref Range   Glucose-Capillary 164 (H) 65 - 99 mg/dL   Comment 1 Notify RN     Objective: General: Patient is awake, alert, and oriented x 3 and in no acute distress.  Integument: Skin is warm, dry and supple bilateral. Nails are tender, long, thickened and  dystrophic with subungual debris, consistent with onychomycosis, 1-5 bilateral. No signs of infection. No open lesions or preulcerative lesions present bilateral. Remaining integument unremarkable.  Vasculature:  Dorsalis Pedis pulse 2/4 bilateral. Posterior Tibial pulse  1/4 bilateral.  Capillary fill time <3 sec 1-5 bilateral. Scant hair growth to the level of the digits. Temperature gradient within normal limits. No varicosities present bilateral. No edema present bilateral.   Neurology: The patient has intact sensation measured with a 5.07/10g Semmes Weinstein Monofilament at all pedal sites bilateral . Vibratory sensation diminished bilateral with tuning fork. No Babinski sign present bilateral.   Musculoskeletal: No symptomatic pedal deformities noted bilateral. Muscular strength 5/5 in all lower extremity muscular groups bilateral without pain on range of  motion . No tenderness with calf compression bilateral.  Assessment and Plan: Problem List Items Addressed This Visit    None    Visit Diagnoses    Dermatophytosis of nail    -  Primary    Diabetic polyneuropathy associated with type 2  diabetes mellitus (HCC)        Relevant Medications    Capsaicin 0.1 % CREA    Foot pain, unspecified laterality           -Examined patient. -Discussed and educated patient on diabetic foot care, especially with  regards to the vascular, neurological and musculoskeletal systems.  -Stressed the importance of good glycemic control and the detriment of not  controlling glucose levels in relation to the foot. -Mechanically debrided all nails 1-5 bilateral using sterile nail nipper and filed with dremel without incident  -Rx Capsaicin cream -Neuro Consult placed  -Answered all patient questions -Patient to return  in 3 months for at risk foot care -Patient advised to call the office if any problems or questions arise in the meantime.  Landis Martins, DPM

## 2015-12-18 ENCOUNTER — Telehealth: Payer: Self-pay | Admitting: *Deleted

## 2015-12-18 ENCOUNTER — Other Ambulatory Visit: Payer: Self-pay | Admitting: Family Medicine

## 2015-12-18 DIAGNOSIS — M79673 Pain in unspecified foot: Secondary | ICD-10-CM

## 2015-12-18 DIAGNOSIS — E1142 Type 2 diabetes mellitus with diabetic polyneuropathy: Secondary | ICD-10-CM

## 2015-12-18 NOTE — Telephone Encounter (Addendum)
-----   Message from Landis Martins, [place] sent at 12/17/2015  2:46 PM EDT ----- Regarding: Neuro consult and NCV/EMG Neuro consult with NCV/EMG for diabetic nerve pain unrelieved with Lyrica and Gabapentin -Dr. Cannon Kettle.  12/18/2015-Faxed referral, orders for NCV with EMG, pt clinicals and demographics to [facility].

## 2015-12-20 ENCOUNTER — Other Ambulatory Visit: Payer: Self-pay | Admitting: *Deleted

## 2015-12-20 ENCOUNTER — Telehealth: Payer: Self-pay | Admitting: Family Medicine

## 2015-12-20 DIAGNOSIS — G4700 Insomnia, unspecified: Secondary | ICD-10-CM

## 2015-12-20 DIAGNOSIS — G629 Polyneuropathy, unspecified: Secondary | ICD-10-CM

## 2015-12-20 MED ORDER — ZOLPIDEM TARTRATE 5 MG PO TABS: 5.0000 mg | ORAL_TABLET | Freq: Every evening | ORAL | Status: DC | PRN

## 2015-12-20 NOTE — Telephone Encounter (Signed)
Please let patient know that [place] ready for pick up

## 2015-12-21 NOTE — Telephone Encounter (Signed)
LVM Rx at front office ready to be pickup 

## 2016-01-08 ENCOUNTER — Other Ambulatory Visit: Payer: Self-pay | Admitting: Family Medicine

## 2016-01-15 ENCOUNTER — Ambulatory Visit (INDEPENDENT_AMBULATORY_CARE_PROVIDER_SITE_OTHER): Payer: Medicaid Other | Admitting: Neurology

## 2016-01-15 DIAGNOSIS — G629 Polyneuropathy, unspecified: Secondary | ICD-10-CM

## 2016-01-15 NOTE — Procedures (Signed)
[facility]  [place], [place]  [place], [place] [address] Tel: [phone] Fax:  [phone] Test Date:  01/15/2016  Patient: Marco [name] DOB: 07-23-1956 Physician: Narda Amber, DO  Sex: Male Height: 5\' 6"  Ref Phys: Dr Cannon Kettle, M.D.  ID#: [id number] Temp: 37.2C Technician: Jerilynn Mages. Dean   Patient Complaints: This is a 60 year old gentleman referred for evaluation of burning, pain, numbness and tingling in feet bilaterally.  NCV & EMG Findings: Extensive electrodiagnostic testing of the left lower extremity and additional studies of the right shows: 1. Bilateral superficial peroneal sensory responses are absent. Bilateral sural sensory responses are within normal limits. 2. Bilateral peroneal and tibial motor responses are within normal limits. 3. Bilateral tibial H reflex studies are within normal limits. 4. There is no evidence of active or chronic motor axon loss changes affecting any of the tested muscles. Motor unit configuration and recruitment pattern is within normal limits.  Impression: There is electrodiagnostic testing is most consistent with a sensory polyneuropathy affecting the lower extremities, moderate in degree electrically. There is no evidence of a superimposed lumbosacral radiculopathy.   ___________________________ Narda Amber, DO    Nerve Conduction Studies Anti Sensory Summary Table   Site NR Peak (ms) Norm Peak (ms) P-T Amp (V) Norm P-T Amp  Left Sup Peroneal Anti Sensory (Ant Lat Mall)  37.2C  12 cm NR  <4.6  >3  Right Sup Peroneal Anti Sensory (Ant Lat Mall)  37.2C  12 cm NR  <4.6  >3  Left Sural Anti Sensory (Lat Mall)  37.2C  Calf    3.1 <4.6 3.2 >3  Right Sural Anti Sensory (Lat Mall)  37.2C  Calf    3.5 <4.6 8.2 >3   Motor Summary Table   Site NR Onset (ms) Norm Onset (ms) O-P Amp (mV) Norm O-P Amp Site1 Site2 Delta-0 (ms) Dist (cm) Vel (m/s) Norm Vel (m/s)  Left Peroneal Motor (Ext Dig Brev)  37.2C  Ankle    3.4  <6.0 3.1 >2.5 B Fib Ankle 6.7 32.0 48 >40  B Fib    10.1  3.2  Poplt B Fib 2.5 10.0 40 >40  Poplt    12.6  3.2         Right Peroneal Motor (Ext Dig Brev)  37.2C  Ankle    3.3 <6.0 3.6 >2.5 B Fib Ankle 6.8 31.0 46 >40  B Fib    10.1  3.1  Poplt B Fib 1.8 10.0 56 >40  Poplt    11.9  3.1         Left Tibial Motor (Abd Hall Brev)  37.2C  Ankle    3.6 <6.0 5.5 >4 Knee Ankle 8.9 39.0 44 >40  Knee    12.5  5.1         Right Tibial Motor (Abd Hall Brev)  37.2C  Ankle    3.4 <6.0 6.4 >4 Knee Ankle 9.3 39.0 42 >40  Knee    12.7  7.1          H Reflex Studies   NR H-Lat (ms) Lat Norm (ms) L-R H-Lat (ms) M-Lat (ms) HLat-MLat (ms)  Left Tibial (Gastroc)  37.2C     33.88 <35 2.18 5.17 28.71  Right Tibial (Gastroc)  37.2C     31.70 <35 2.18 5.17 26.53   EMG   Side Muscle Ins Act Fibs Psw Fasc Number Recrt Dur Dur. Amp Amp. Poly Poly. Comment  Left AntTibialis Nml Nml Nml Nml Nml Nml Nml  Nml Nml Nml Nml Nml N/A  Left Gastroc Nml Nml Nml Nml Nml Nml Nml Nml Nml Nml Nml Nml N/A  Left Flex Dig Long Nml Nml Nml Nml Nml Nml Nml Nml Nml Nml Nml Nml N/A  Left RectFemoris Nml Nml Nml Nml Nml Nml Nml Nml Nml Nml Nml Nml N/A  Left GluteusMed Nml Nml Nml Nml Nml Nml Nml Nml Nml Nml Nml Nml N/A  Right AntTibialis Nml Nml Nml Nml Nml Nml Nml Nml Nml Nml Nml Nml N/A  Right Gastroc Nml Nml Nml Nml Nml Nml Nml Nml Nml Nml Nml Nml N/A  Right Flex Dig Long Nml Nml Nml Nml Nml Nml Nml Nml Nml Nml Nml Nml N/A      Waveforms:

## 2016-01-22 ENCOUNTER — Encounter (HOSPITAL_COMMUNITY): Payer: Self-pay

## 2016-01-22 ENCOUNTER — Emergency Department (HOSPITAL_COMMUNITY): Payer: Medicaid Other

## 2016-01-22 ENCOUNTER — Encounter (HOSPITAL_COMMUNITY): Payer: Self-pay | Admitting: Emergency Medicine

## 2016-01-22 ENCOUNTER — Ambulatory Visit (HOSPITAL_COMMUNITY)
Admission: EM | Admit: 2016-01-22 | Discharge: 2016-01-22 | Disposition: A | Discharge status: Nursing Home (Includes ICF) | Payer: Medicaid Other | Attending: Family Medicine | Admitting: Family Medicine

## 2016-01-22 ENCOUNTER — Emergency Department (HOSPITAL_COMMUNITY)
Admission: EM | Admit: 2016-01-22 | Discharge: 2016-01-22 | Disposition: A | Discharge status: Home / Self Care | Payer: Medicaid Other | Attending: Emergency Medicine | Admitting: Emergency Medicine

## 2016-01-22 DIAGNOSIS — I10 Essential (primary) hypertension: Secondary | ICD-10-CM | POA: Insufficient documentation

## 2016-01-22 DIAGNOSIS — R52 Pain, unspecified: Secondary | ICD-10-CM

## 2016-01-22 DIAGNOSIS — Z794 Long term (current) use of insulin: Secondary | ICD-10-CM | POA: Diagnosis not present

## 2016-01-22 DIAGNOSIS — R200 Anesthesia of skin: Secondary | ICD-10-CM | POA: Diagnosis not present

## 2016-01-22 DIAGNOSIS — M791 Myalgia, unspecified site: Secondary | ICD-10-CM

## 2016-01-22 DIAGNOSIS — Z7984 Long term (current) use of oral hypoglycemic drugs: Secondary | ICD-10-CM | POA: Diagnosis not present

## 2016-01-22 DIAGNOSIS — M2550 Pain in unspecified joint: Secondary | ICD-10-CM

## 2016-01-22 DIAGNOSIS — E119 Type 2 diabetes mellitus without complications: Secondary | ICD-10-CM | POA: Diagnosis not present

## 2016-01-22 DIAGNOSIS — M542 Cervicalgia: Secondary | ICD-10-CM | POA: Insufficient documentation

## 2016-01-22 LAB — CBC
HEMATOCRIT: 42.8 % (ref 39.0–52.0)
HEMOGLOBIN: 13.8 g/dL (ref 13.0–17.0)
MCH: 29.4 pg (ref 26.0–34.0)
MCHC: 32.2 g/dL (ref 30.0–36.0)
MCV: 91.1 fL (ref 78.0–100.0)
Platelets: 297 10*3/uL (ref 150–400)
RBC: 4.7 MIL/uL (ref 4.22–5.81)
RDW: 14.1 % (ref 11.5–15.5)
WBC: 8.1 10*3/uL (ref 4.0–10.5)

## 2016-01-22 LAB — BASIC METABOLIC PANEL
ANION GAP: 12 (ref 5–15)
BUN: 8 mg/dL (ref 6–20)
CO2: 30 mmol/L (ref 22–32)
Calcium: 8.8 mg/dL — ABNORMAL LOW (ref 8.9–10.3)
Chloride: 99 mmol/L — ABNORMAL LOW (ref 101–111)
Creatinine, Ser: 1.18 mg/dL (ref 0.61–1.24)
Glucose, Bld: 137 mg/dL — ABNORMAL HIGH (ref 65–99)
POTASSIUM: 3.1 mmol/L — AB (ref 3.5–5.1)
SODIUM: 141 mmol/L (ref 135–145)

## 2016-01-22 LAB — HEPATIC FUNCTION PANEL
ALT: 15 U/L — AB (ref 17–63)
AST: 23 U/L (ref 15–41)
Albumin: 3.6 g/dL (ref 3.5–5.0)
Alkaline Phosphatase: 38 U/L (ref 38–126)
Bilirubin, Direct: <0.1 mg/dL — ABNORMAL LOW (ref 0.1–0.5)
TOTAL PROTEIN: 7.3 g/dL (ref 6.5–8.1)
Total Bilirubin: 0.8 mg/dL (ref 0.3–1.2)

## 2016-01-22 LAB — LIPASE, BLOOD: LIPASE: 40 U/L (ref 11–51)

## 2016-01-22 LAB — I-STAT TROPONIN, ED: TROPONIN I, POC: 0.02 ng/mL (ref 0.00–0.08)

## 2016-01-22 MED ORDER — OXYCODONE-ACETAMINOPHEN 5-325 MG PO TABS
1.0000 | ORAL_TABLET | Freq: Once | ORAL | Status: AC
  Administered 2016-01-22: 1 via ORAL
  Filled 2016-01-22: qty 1

## 2016-01-22 NOTE — ED Provider Notes (Signed)
CSN: [id number]     Arrival date & time 01/22/16  1618 History   First MD Initiated Contact with Patient 01/22/16 1932     Chief Complaint  Patient presents with  . body pain    HPI Pt started having pain last night on the right side of his head.  This morning the pain increased and involved his right neck and shoulder and down his right side.  Movement increases the pain.  The right hand also feels numb.  NO fevers. No cough.  No vomiting or diarrhea.  He went to an urgent care who sent him to the ED to be evaluated. Past Medical History  Diagnosis Date  . Non-obstructive CAD     a. 02/2013 Cath ([place]): nonobs dzs;  b. 09/2014 Myoview ([place]): EF 55%, no ischemia;  c. 12/2014 Echo: EF 50-55%, gr1 DD, no effusion.  . Essential hypertension   . Diabetes mellitus without complication (HCC)   . Rheumatoid arthritis (HCC)   . Chronic pain   . PAF (paroxysmal atrial fibrillation) (HCC)     a. 02/2013 s/p rfca in [place], [place]-->prev on Xarelto, d/c'd 2/2 anemia, ? GIB.  [name] Benign colon polyp 08/01/2013    [facility] in [place]. large base tranverse colon polyp was biopsied. polyp was benign with minimal surface hyperplastic change.  . Morbid obesity (HCC)   . Gastritis 07/29/2013    confirmed on EGD, bx done an negative for intestinal metaplasia, dsyplasia or H. pylori. normal gastric emptying study done 07/13/2013.  [name] Esophageal hiatal hernia 07/29/2013    confirmed on EGD   . Sigmoid diverticulosis 08/01/2013    confirmed on colonscopy. record scanned into chart  . GERD (gastroesophageal reflux disease)   . Esophageal stricture   . Diverticulosis    Past Surgical History  Procedure Laterality Date  . Colon surgery  09/2014    colon resection   . Cardiac catheterization  05/2014    ablation for atrial fibrillation  . Colon resection  09/2013    due to large, abnormal polpy. non cancerous per patient.   . Cardiac catheterization N/A 11/16/2015    Procedure: Left Heart Cath and  Coronary Angiography;  Surgeon: Marykay Lex, MD;  Location[facility] INVASIVE CV LAB;  Service: Cardiovascular;  Laterality: N/A;   Family History  Problem Relation Age of Onset  . Hypertension Mother   . Diabetes Mother   . Cancer Mother   . COPD Mother   . Hypertension Father   . Diabetes Father   . Heart Problems Father   . COPD Father    Social History  Substance Use Topics  . Smoking status: Never Smoker   . Smokeless tobacco: Never Used  . Alcohol Use: No    Review of Systems  All other systems reviewed and are negative.     Allergies  Review of patient's allergies indicates no known allergies.  Home Medications   Prior to Admission medications   Medication Sig Start Date End Date Taking? Authorizing Provider  ACCU-CHEK FASTCLIX LANCETS MISC 1 each by Other route 3 (three) times daily. 06/19/15  Yes Josalyn Funches, MD  allopurinol (ZYLOPRIM) 100 MG tablet Take 1 tablet (100 mg total) by mouth daily. 10/12/15  Yes Josalyn Funches, MD  atorvastatin (LIPITOR) 40 MG tablet TAKE ONE TABLET BY MOUTH ONCE DAILY 08/07/15  Yes Josalyn Funches, MD  Blood Glucose Monitoring Suppl (ACCU-CHEK AVIVA PLUS) W/DEVICE KIT 1 Device by Does not apply route 3 (three) times  daily after meals. E11.9 06/14/15  Yes Josalyn Funches, MD  Blood Glucose Monitoring Suppl (ACCU-CHEK AVIVA PLUS) W/DEVICE KIT Used as directed 06/14/15  Yes Josalyn Funches, MD  carvedilol (COREG) 12.5 MG tablet Take 1 tablet (12.5 mg total) by mouth 2 (two) times daily. 11/07/15  Yes Josalyn Funches, MD  diltiazem (DILACOR XR) 180 MG 24 hr capsule TAKE ONE CAPSULE BY MOUTH ONCE DAILY 12/11/15  Yes Josalyn Funches, MD  EQ ASPIRIN ADULT LOW DOSE 81 MG EC tablet TAKE ONE TABLET BY MOUTH ONCE DAILY 01/09/16  Yes Josalyn Funches, MD  furosemide (LASIX) 40 MG tablet TAKE ONE TABLET BY MOUTH ONCE DAILY 01/09/16  Yes Josalyn Funches, MD  glucose blood (ACCU-CHEK AVIVA PLUS) test strip 1 each by Other route 3 (three) times daily. E11.9  06/14/15  Yes Josalyn Funches, MD  HYDROcodone-acetaminophen (NORCO/VICODIN) 5-325 MG tablet Take 1 tablet by mouth every 8 (eight) hours as needed for moderate pain. Per pain management   Yes Historical Provider, MD  insulin aspart (NOVOLOG) 100 UNIT/ML injection Inject 14-24 Units into the skin 3 (three) times daily with meals. Reported on 09/20/2015   Yes Historical Provider, MD  Insulin Detemir (LEVEMIR FLEXPEN) 100 UNIT/ML Pen Inject 60 Units into the skin daily at 10 pm. 02/23/15  Yes Josalyn Funches, MD  Insulin Pen Needle (BD PEN NEEDLE NANO U/F) 32G X 4 MM MISC 1 each by Does not apply route 3 (three) times daily. 08/08/15  Yes Josalyn Funches, MD  lisinopril (PRINIVIL,ZESTRIL) 30 MG tablet TAKE ONE TABLET BY MOUTH ONCE DAILY 11/26/15  Yes Josalyn Funches, MD  metFORMIN (GLUCOPHAGE-XR) 500 MG 24 hr tablet Take 500 mg by mouth 2 (two) times daily.  10/04/15  Yes Historical Provider, MD  pantoprazole (PROTONIX) 40 MG tablet Take 40 mg by mouth daily.  11/13/15  Yes Hilarie Fredrickson, MD  predniSONE (DELTASONE) 10 MG tablet Take 1 tablet (10 mg total) by mouth daily with breakfast. 08/08/15  Yes Josalyn Funches, MD  ranitidine (ZANTAC) 150 MG capsule Take 150 mg by mouth 2 (two) times daily.  11/13/15  Yes Hilarie Fredrickson, MD  zolpidem (AMBIEN) 5 MG tablet Take 1 tablet (5 mg total) by mouth at bedtime as needed for sleep. 12/20/15 01/22/16 Yes Josalyn Funches, MD  Capsaicin 0.1 % CREA Apply 1 application topically once. 12/17/15   Titorya Stover, DPM   BP 113/84 mmHg  Pulse 75  Temp(Src) 97.5 F (36.4 C) (Oral)  Resp 18  SpO2 97% Physical Exam  Constitutional: He appears well-developed and well-nourished. No distress.  HENT:  Head: Normocephalic and atraumatic.  Right Ear: External ear normal.  Left Ear: External ear normal.  Eyes: Conjunctivae are normal. Right eye exhibits no discharge. Left eye exhibits no discharge. No scleral icterus.  Neck: Neck supple. No tracheal deviation present.  ttp  paraspinal region  Cardiovascular: Normal rate, regular rhythm and intact distal pulses.   Pulmonary/Chest: Effort normal and breath sounds normal. No stridor. No respiratory distress. He has no wheezes. He has no rales. He exhibits tenderness.  Abdominal: Soft. Bowel sounds are normal. He exhibits no distension. There is generalized tenderness. There is no rebound and no guarding.  Musculoskeletal: He exhibits tenderness. He exhibits no edema.  ttp diffusely right upper and lower extrem, no joint swelling, no erythema   Neurological: He is alert. He has normal strength. No cranial nerve deficit (no facial droop, extraocular movements intact, no slurred speech) or sensory deficit. He exhibits normal muscle tone. He displays no  seizure activity. Coordination normal.  Skin: Skin is warm and dry. No rash noted.  Psychiatric: He has a normal mood and affect.  Nursing note and vitals reviewed.   ED Course  Procedures (including critical care time) Labs Review Labs Reviewed  BASIC METABOLIC PANEL - Abnormal; Notable for the following:    Potassium 3.1 (*)    Chloride 99 (*)    Glucose, Bld 137 (*)    Calcium 8.8 (*)    All other components within normal limits  HEPATIC FUNCTION PANEL - Abnormal; Notable for the following:    ALT 15 (*)    Bilirubin, Direct <0.1 (*)    All other components within normal limits  CBC  LIPASE, BLOOD  I-STAT TROPOININ, ED    Imaging Review Dg Chest 2 View  01/22/2016  CLINICAL DATA:  Pt states later last night he developed a severe rt sided headache, and now has rt chest and arm pains, hx past heart attack, htn, diabetes, some sob today also, non smoker EXAM: CHEST  2 VIEW COMPARISON:  09/20/2015 FINDINGS: Cardiac silhouette is normal in size and configuration. There are no mediastinal or hilar masses and no evidence of adenopathy. Clear lungs.  No pleural effusion or pneumothorax. Bony thorax is intact. IMPRESSION: No active cardiopulmonary disease.  Electronically Signed   By: Lajean Manes M.D.   On: 01/22/2016 19:59   Ct Head Wo Contrast  01/22/2016  CLINICAL DATA:  Right head and neck pain radiating into the right arm and back. Symptoms began last night. No known injury. EXAM: CT HEAD WITHOUT CONTRAST CT CERVICAL SPINE WITHOUT CONTRAST TECHNIQUE: Multidetector CT imaging of the head and cervical spine was performed following the standard protocol without intravenous contrast. Multiplanar CT image reconstructions of the cervical spine were also generated. COMPARISON:  Head CT scan 10/10/2015. FINDINGS: CT HEAD FINDINGS The brain appears normal without hemorrhage, infarct, mass lesion, mass effect, midline shift or abnormal extra-axial fluid collection. No hydrocephalus or pneumocephalus. Imaged left maxillary sinus is nearly completely opacified and there is marked mucosal thickening in the left sphenoid sinus. Imaged paranasal sinuses are otherwise clear. Mastoid air cells are clear. Carotid atherosclerosis is noted. CT CERVICAL SPINE FINDINGS No fracture or malalignment of the cervical spine is identified. Intervertebral disc space height is maintained. Carotid atherosclerosis is noted. Lung apices are clear. IMPRESSION: No acute abnormality head or cervical spine. No marked change in left maxillary and sphenoid sinus disease. Atherosclerosis. Electronically Signed   By: Inge Rise M.D.   On: 01/22/2016 20:30   Ct Cervical Spine Wo Contrast  01/22/2016  CLINICAL DATA:  Right head and neck pain radiating into the right arm and back. Symptoms began last night. No known injury. EXAM: CT HEAD WITHOUT CONTRAST CT CERVICAL SPINE WITHOUT CONTRAST TECHNIQUE: Multidetector CT imaging of the head and cervical spine was performed following the standard protocol without intravenous contrast. Multiplanar CT image reconstructions of the cervical spine were also generated. COMPARISON:  Head CT scan 10/10/2015. FINDINGS: CT HEAD FINDINGS The brain appears  normal without hemorrhage, infarct, mass lesion, mass effect, midline shift or abnormal extra-axial fluid collection. No hydrocephalus or pneumocephalus. Imaged left maxillary sinus is nearly completely opacified and there is marked mucosal thickening in the left sphenoid sinus. Imaged paranasal sinuses are otherwise clear. Mastoid air cells are clear. Carotid atherosclerosis is noted. CT CERVICAL SPINE FINDINGS No fracture or malalignment of the cervical spine is identified. Intervertebral disc space height is maintained. Carotid atherosclerosis is noted. Lung apices are  clear. IMPRESSION: No acute abnormality head or cervical spine. No marked change in left maxillary and sphenoid sinus disease. Atherosclerosis. Electronically Signed   By: Inge Rise M.D.   On: 01/22/2016 20:30   I have personally reviewed and evaluated these images and lab results as part of my medical decision-making.   EKG Interpretation   Date/Time:  Tuesday January 22 2016 16:38:15 EDT Ventricular Rate:  69 PR Interval:  122 QRS Duration: 90 QT Interval:  426 QTC Calculation: 456 R Axis:   6 Text Interpretation:  Normal sinus rhythm Nonspecific ST and T wave  abnormality Abnormal ECG No significant change since last tracing  Confirmed by Averianna Brugger  MD-J, Alverto Shedd ([id number]) on 01/22/2016 7:35:31 PM      MDM   Final diagnoses:  Myalgia  Arthralgia    Patient has tenderness in his neck, extremities as well as chest. Laboratory tests and CT scans are reassuring.  I suspect this pain may be musculoskeletal in nature.  Will dc home.  Follow up with PCP and pain management.    Dorie Rank, MD 01/22/16 2207

## 2016-01-22 NOTE — ED Provider Notes (Signed)
CSN: [id number]     Arrival date & time 01/22/16  1447 History   None    Chief Complaint  Patient presents with  . Headache  . Neck Pain   (Consider location/radiation/quality/duration/timing/severity/associated sxs/prior Treatment) HPI History obtained from patient: Location:  Headache and right side pain Context/Duration: Sudden onset last night, worse today, no known injury  Severity: 6  Quality:ache, shooting pain Timing:       Constant but worsening     Home Treatment: has taken usual pain meds without relief Associated symptoms:  Pain in chest and right leg, right shoulder and neck Family History: HTN-DM-mother    Past Medical History  Diagnosis Date  . Non-obstructive CAD     a. 02/2013 Cath ([place]): nonobs dzs;  b. 09/2014 Myoview ([place]): EF 55%, no ischemia;  c. 12/2014 Echo: EF 50-55%, gr1 DD, no effusion.  . Essential hypertension   . Diabetes mellitus without complication ([place])   . Rheumatoid arthritis ([place])   . Chronic pain   . PAF (paroxysmal atrial fibrillation) ([place])     a. 02/2013 s/[place] rfca in [place], [place]-->prev on Xarelto, d/c'd 2/2 anemia, ? GIB.  [name] Benign colon polyp 08/01/2013    [facility] in [place]. large base tranverse colon polyp was biopsied. polyp was benign with minimal surface hyperplastic change.  . Morbid obesity ([place])   . Gastritis 07/29/2013    confirmed on EGD, bx done an negative for intestinal metaplasia, dsyplasia or H. pylori. normal gastric emptying study done 07/13/2013.  [name] Esophageal hiatal hernia 07/29/2013    confirmed on EGD   . Sigmoid diverticulosis 08/01/2013    confirmed on colonscopy. record scanned into chart  . GERD (gastroesophageal reflux disease)   . Esophageal stricture   . Diverticulosis    Past Surgical History  Procedure Laterality Date  . Colon surgery  09/2014    colon resection   . Cardiac catheterization  05/2014    ablation for atrial fibrillation  . Colon resection  09/2013    due to large,  abnormal polpy. non cancerous per patient.   . Cardiac catheterization N/A 11/16/2015    Procedure: Left Heart Cath and Coronary Angiography;  Surgeon: Leonie Man, MD;  Location: [place] CV LAB;  Service: Cardiovascular;  Laterality: N/A;   Family History  Problem Relation Age of Onset  . Hypertension Mother   . Diabetes Mother   . Cancer Mother   . COPD Mother   . Hypertension Father   . Diabetes Father   . Heart Problems Father   . COPD Father    Social History  Substance Use Topics  . Smoking status: Never Smoker   . Smokeless tobacco: Never Used  . Alcohol Use: No    Review of Systems  Denies:  NAUSEA, ABDOMINAL PAIN, CHEST PAIN, CONGESTION, DYSURIA, SHORTNESS OF BREATH  Allergies  Review of patient's allergies indicates no known allergies.  Home Medications   Prior to Admission medications   Medication Sig Start Date End Date Taking? Authorizing Provider  ACCU-CHEK FASTCLIX LANCETS MISC 1 each by Other route 3 (three) times daily. 06/19/15   Josalyn Funches, MD  allopurinol (ZYLOPRIM) 100 MG tablet Take 1 tablet (100 mg total) by mouth daily. 10/12/15   Josalyn Funches, MD  atorvastatin (LIPITOR) 40 MG tablet TAKE ONE TABLET BY MOUTH ONCE DAILY 08/07/15   Boykin Nearing, MD  Blood Glucose Monitoring Suppl (ACCU-CHEK AVIVA PLUS) W/DEVICE KIT 1 Device by Does not apply route 3 (three)  times daily after meals. E11.9 06/14/15   Josalyn Funches, MD  Blood Glucose Monitoring Suppl (ACCU-CHEK AVIVA PLUS) W/DEVICE KIT Used as directed 06/14/15   Boykin Nearing, MD  Capsaicin 0.1 % CREA Apply 1 application topically once. 12/17/15   Titorya Stover, DPM  carvedilol (COREG) 12.5 MG tablet Take 1 tablet (12.5 mg total) by mouth 2 (two) times daily. 11/07/15   Josalyn Funches, MD  diltiazem (DILACOR XR) 180 MG 24 hr capsule TAKE ONE CAPSULE BY MOUTH ONCE DAILY 12/11/15   Josalyn Funches, MD  EQ ASPIRIN ADULT LOW DOSE 81 MG EC tablet TAKE ONE TABLET BY MOUTH ONCE DAILY 01/09/16   Josalyn  Funches, MD  furosemide (LASIX) 40 MG tablet TAKE ONE TABLET BY MOUTH ONCE DAILY 01/09/16   Josalyn Funches, MD  glucose blood (ACCU-CHEK AVIVA PLUS) test strip 1 each by Other route 3 (three) times daily. E11.9 06/14/15   Josalyn Funches, MD  glucose blood test strip Use as instructed 06/12/15   Josalyn Funches, MD  glucose blood test strip Use as instructed 06/14/15   Boykin Nearing, MD  HYDROcodone-acetaminophen (NORCO/VICODIN) 5-325 MG tablet Take 1 tablet by mouth every 8 (eight) hours as needed for moderate pain. Per pain management    Historical Provider, MD  insulin aspart (NOVOLOG) 100 UNIT/ML injection Inject 14-24 Units into the skin 3 (three) times daily with meals. Reported on 09/20/2015    Historical Provider, MD  Insulin Detemir (LEVEMIR FLEXPEN) 100 UNIT/ML Pen Inject 60 Units into the skin daily at 10 pm. 02/23/15   Boykin Nearing, MD  Insulin Pen Needle (BD PEN NEEDLE NANO U/F) 32G X 4 MM MISC 1 each by Does not apply route 3 (three) times daily. 08/08/15   Boykin Nearing, MD  Lancet Devices [facility]) lancets Use as instructed 08/03/15   Boykin Nearing, MD  lisinopril (PRINIVIL,ZESTRIL) 30 MG tablet TAKE ONE TABLET BY MOUTH ONCE DAILY 11/26/15   Boykin Nearing, MD  metFORMIN (GLUCOPHAGE-XR) 500 MG 24 hr tablet Take 500 mg by mouth 2 (two) times daily.  10/04/15   Historical Provider, MD  pantoprazole (PROTONIX) 40 MG tablet Take 40 mg by mouth 2 (two) times daily.  11/13/15   Irene Shipper, MD  predniSONE (DELTASONE) 10 MG tablet Take 1 tablet (10 mg total) by mouth daily with breakfast. 08/08/15   Boykin Nearing, MD  pregabalin (LYRICA) 25 MG capsule Take 25 mg by mouth 2 (two) times daily.    Historical Provider, MD  ranitidine (ZANTAC) 150 MG capsule Take 150 mg by mouth 2 (two) times daily.  11/13/15   Irene Shipper, MD  zolpidem (AMBIEN) 5 MG tablet Take 1 tablet (5 mg total) by mouth at bedtime as needed for sleep. 12/20/15 01/19/16  Boykin Nearing, MD   Meds Ordered and  Administered this Visit  Medications - No data to display  BP 111/70 mmHg  Pulse 71  Temp(Src) 98.1 F (36.7 C) (Oral)  Resp 22  SpO2 97% No data found.   Physical Exam NURSES NOTES AND VITAL SIGNS REVIEWED. CONSTITUTIONAL: Well developed, well nourished, no acute distress HEENT: normocephalic, atraumatic EYES: Conjunctiva normal NECK:normal ROM, supple, no adenopathy, not tender to palpation PULMONARY:No respiratory distress, normal effort ABDOMINAL: Soft, ND, NT BS+, No CVAT MUSCULOSKELETAL: Normal ROM of all extremities,  SKIN: warm and dry without rash PSYCHIATRIC: Mood and affect, behavior are normal  ED Course  Procedures (including critical care time)  Labs Review Labs Reviewed - No data to display  Imaging Review No  results found.   Visual Acuity Review  Right Eye Distance:   Left Eye Distance:   Bilateral Distance:    Right Eye Near:   Left Eye Near:    Bilateral Near:        Pt would like to go to ER.  I have explained to pt that UC is limited in its diagnostic spectrum. States he is a bit angry that his PCP sent him to the UC. Does not want more pain medication. Unable to reach pain management provider.  MDM  No diagnosis found.        Konrad Felix, [place] 01/22/16 1555

## 2016-01-22 NOTE — Discharge Instructions (Signed)
Joint Pain °Joint pain, which is also called arthralgia, can be caused by many things. Joint pain often goes away when you follow your health care provider's instructions for relieving pain at home. However, joint pain can also be caused by conditions that require further treatment. Common causes of joint pain include: °· Bruising in the area of the joint. °· Overuse of the joint. °· Wear and tear on the joints that occur with aging (osteoarthritis). °· Various other forms of arthritis. °· A buildup of a crystal form of uric acid in the joint (gout). °· Infections of the joint (septic arthritis) or of the bone (osteomyelitis). °Your health care provider may recommend medicine to help with the pain. If your joint pain continues, additional tests may be needed to diagnose your condition. °HOME CARE INSTRUCTIONS °Watch your condition for any changes. Follow these instructions as directed to lessen the pain that you are feeling. °· Take medicines only as directed by your health care provider. °· Rest the affected area for as long as your health care provider says that you should. If directed to do so, raise the painful joint above the level of your heart while you are sitting or lying down. °· Do not do things that cause or worsen pain. °· If directed, apply ice to the painful area: °¨ Put ice in a plastic bag. °¨ Place a towel between your skin and the bag. °¨ Leave the ice on for 20 minutes, 2-3 times per day. °· Wear an elastic bandage, splint, or sling as directed by your health care provider. Loosen the elastic bandage or splint if your fingers or toes become numb and tingle, or if they turn cold and blue. °· Begin exercising or stretching the affected area as directed by your health care provider. Ask your health care provider what types of exercise are safe for you. °· Keep all follow-up visits as directed by your health care provider. This is important. °SEEK MEDICAL CARE IF: °· Your pain increases, and medicine  does not help. °· Your joint pain does not improve within 3 days. °· You have increased bruising or swelling. °· You have a fever. °· You lose 10 lb (4.5 kg) or more without trying. °SEEK IMMEDIATE MEDICAL CARE IF: °· You are not able to move the joint. °· Your fingers or toes become numb or they turn cold and blue. °  °This information is not intended to replace advice given to you by your health care provider. Make sure you discuss any questions you have with your health care provider. °  °Document Released: 07/28/2005 Document Revised: 08/18/2014 Document Reviewed: 05/09/2014 °Elsevier Interactive Patient Education ©2016 Elsevier Inc[employer] ° °Muscle Pain, Adult °Muscle pain (myalgia) may be caused by many things, including: °· Overuse or muscle strain, especially if you are not in shape. This is the most common cause of muscle pain. °· Injury. °· Bruises. °· Viruses, such as the flu. °· Infectious diseases. °· Fibromyalgia, which is a chronic condition that causes muscle tenderness, fatigue, and headache. °· Autoimmune diseases, including lupus. °· Certain drugs, including ACE inhibitors and statins. °Muscle pain may be mild or severe. In most cases, the pain lasts only a short time and goes away without treatment. To diagnose the cause of your muscle pain, your health care provider will take your medical history. This means he or she will ask you when your muscle pain began and what has been happening. If you have not had muscle pain for very long,   your health care provider may want to wait before doing much testing. If your muscle pain has lasted a long time, your health care provider may want to run tests right away. If your health care provider thinks your muscle pain may be caused by illness, you may need to have additional tests to rule out certain conditions.  Treatment for muscle pain depends on the cause. Home care is often enough to relieve muscle pain. Your health care provider may also prescribe  anti-inflammatory medicine. HOME CARE INSTRUCTIONS Watch your condition for any changes. The following actions may help to lessen any discomfort you are feeling:  Only take over-the-counter or prescription medicines as directed by your health care provider.  Apply ice to the sore muscle:  Put ice in a plastic bag.  Place a towel between your skin and the bag.  Leave the ice on for 15-20 minutes, 3-4 times a day.  You may alternate applying hot and cold packs to the muscle as directed by your health care provider.  If overuse is causing your muscle pain, slow down your activities until the pain goes away.  Remember that it is normal to feel some muscle pain after starting a workout program. Muscles that have not been used often will be sore at first.  Do regular, gentle exercises if you are not usually active.  Warm up before exercising to lower your risk of muscle pain.  Do not continue working out if the pain is very bad. Bad pain could mean you have injured a muscle. SEEK MEDICAL CARE IF:  Your muscle pain gets worse, and medicines do not help.  You have muscle pain that lasts longer than 3 days.  You have a rash or fever along with muscle pain.  You have muscle pain after a tick bite.  You have muscle pain while working out, even though you are in good physical condition.  You have redness, soreness, or swelling along with muscle pain.  You have muscle pain after starting a new medicine or changing the dose of a medicine. SEEK IMMEDIATE MEDICAL CARE IF:  You have trouble breathing.  You have trouble swallowing.  You have muscle pain along with a stiff neck, fever, and vomiting.  You have severe muscle weakness or cannot move part of your body. MAKE SURE YOU:   Understand these instructions.  Will watch your condition.  Will get help right away if you are not doing well or get worse.   This information is not intended to replace advice given to you by your  health care provider. Make sure you discuss any questions you have with your health care provider.   Document Released: 06/19/2006 Document Revised: 08/18/2014 Document Reviewed: 05/24/2013 Elsevier Interactive Patient Education [employer].

## 2016-01-22 NOTE — ED Notes (Signed)
Patient here from urgent care for further evaluation of right sided body pain. Started with headache last pm and progressed down right side of body, denies trauma. Alert and oriented. Pain worse with any movdement

## 2016-01-22 NOTE — ED Notes (Signed)
Patient complains of right head and right neck, pain radiating down right arm, right back and includes right back and specific area of right chest.  Patient reports all areas of pain are made worse with movement.  Symptoms started last night.  No known injury.  Patient is a client af the pain clinic for multiple pain issues, "arthritis", "left chest wall pain" describes pain as " sharp, shooting, and pressure"

## 2016-01-25 ENCOUNTER — Encounter: Payer: Self-pay | Admitting: Neurology

## 2016-01-25 ENCOUNTER — Ambulatory Visit (INDEPENDENT_AMBULATORY_CARE_PROVIDER_SITE_OTHER): Payer: Medicaid Other | Admitting: Neurology

## 2016-01-25 VITALS — BP 100/68 | HR 77 | Ht 66.0 in | Wt 196.5 lb

## 2016-01-25 DIAGNOSIS — E0842 Diabetes mellitus due to underlying condition with diabetic polyneuropathy: Secondary | ICD-10-CM

## 2016-01-25 NOTE — Patient Instructions (Addendum)
1.  Optimize Lyrica as recommended by your pain management provider.  Other options to consider include nortriptyline or Cymbalta.  2.  Start lidocaine ointment to feet  Return to clinic as needed

## 2016-01-25 NOTE — Progress Notes (Signed)
[facility] Clinic Note - Initial Visit   Date: 01/25/2016  Dmitriy Gair MRN: [id number] DOB: 03-30-1956   Dear Dr. Cannon Kettle:  Thank you for your kind referral of Anay Rathe for consultation of diabetic neuropathy. Although his history is well known to you, please allow [place] to reiterate it for the purpose of our medical record. The patient was accompanied to the clinic by self.    History of Present Illness: Kallum Jorgensen is a 60 y.o. right-handed African American male with insulin-independent diabetes complicated by neuropathy, hypertension, GERD, gout, CAD, rheumatoid arthritis on prednisone 62m daily, PAF, depression, and obseity presenting for evaluation of bilateral feet paresthesias.    He was diagnosed with diabetes around 2009 and around 2011, he began noticing tingling, burning, and numbness of the feet, mostly involving the soles of the feet.  He was diagnosed with diabetic neuropathy and started on gabapentin which he was taking 6034mtwice daily.  Several months ago, he was switched to Lyrica.  [facility], but this was discontinued for unclear reason.  He also takes capsaicin ointment but this does not help.  He takes hydrocodone for joint pain and chest pain.   NCS/EMG showed sensory predominant polyneuropathy with absent bilateral superficial peroneal and normal sural responses.  There was no evidence of motor involvement.   He has some weakness of the legs, imbalance, and intermittent numbness of the fingers.  He is not dropping objects and denies any recent falls.   Out-side paper records, electronic medical record, and images have been reviewed where available and summarized as:  NCS/EMG of the lower extremities 01/15/2016:  There is electrodiagnostic testing is most consistent with a sensory polyneuropathy affecting the lower extremities, moderate in degree electrically. There is no evidence of a  superimposed lumbosacral radiculopathy.  Past Medical History  Diagnosis Date  . Non-obstructive CAD     a. [place]/2013 [place] ([place] nonobs dz[place];  b. 09/2014 [place]iew ([place] EF 55%, no ischemia;  c. 12/2014 Echo: EF 50-55%, gr1 DD, no effusion.  . Essential hypertension   . Diabetes mellitus without complication ([facility]  . Rheumatoid arthritis ([facility]  . Chronic pain   . PAF (paroxysmal atrial fibrillation) ([facility]    a. [place]prev [place] X[place], d/c'd 2/2 anemia, ? GIB.  . [name] Kitchenenign colon polyp [facility]/01/2013    [facility]. polyp was benign with minimal surface hyperplastic change.  . Morbid obesity ([place]  . Gastritis 07/29/2013    confirmed on EGD, bx done an negative for intestinal metaplasia, dsyplasia or H. pylori. normal gastric emptying study done 07/13/2013.  . [name] Kitchensophageal hiatal hernia 07/29/2013    confirmed on EGD   . Sigmoid diverticulosis 08/01/2013    confirmed on colonscopy. record scanned into chart  . GERD (gastroesophageal reflux disease)   . Esophageal stricture   . Diverticulosis     Past Surgical History  Procedure Laterality Date  . Colon surgery  09/2014    colon resection   . Cardiac catheterization  05/2014    ablation for atrial fibrillation  . Colon resection  09/2013    due to large, abnormal polpy. non cancerous per patient.   . Cardiac catheterization N/A 11/16/2015    Procedure: Left Heart Cath and Coronary Angiography;  Surgeon: DaLeonie ManMD;  [facility];  Service: Cardiovascular;  Laterality: N/A;     Medications:  Outpatient Encounter Prescriptions as of 01/25/2016  Medication Sig  . ACCU-CHEK FASTCLIX LANCETS MISC 1 each by Other route 3 (three) times daily.  [name] allopurinol (ZYLOPRIM) 100 MG tablet Take 1 tablet (100 mg total) by mouth daily.  [name] atorvastatin (LIPITOR) 40 MG tablet TAKE ONE TABLET BY MOUTH ONCE DAILY  . Blood Glucose  Monitoring Suppl (ACCU-CHEK AVIVA PLUS) W/DEVICE KIT 1 Device by Does not apply route 3 (three) times daily after meals. E11.9  . Blood Glucose Monitoring Suppl (ACCU-CHEK AVIVA PLUS) W/DEVICE KIT Used as directed  . Capsaicin 0.1 % CREA Apply 1 application topically once.  . carvedilol (COREG) 12.5 MG tablet Take 1 tablet (12.5 mg total) by mouth 2 (two) times daily.  [name] diltiazem (DILACOR XR) 180 MG 24 hr capsule TAKE ONE CAPSULE BY MOUTH ONCE DAILY  . EQ ASPIRIN ADULT LOW DOSE 81 MG EC tablet TAKE ONE TABLET BY MOUTH ONCE DAILY  . furosemide (LASIX) 40 MG tablet TAKE ONE TABLET BY MOUTH ONCE DAILY  . glucose blood (ACCU-CHEK AVIVA PLUS) test strip 1 each by Other route 3 (three) times daily. E11.9  . HYDROcodone-acetaminophen (NORCO/VICODIN) 5-325 MG tablet Take 1 tablet by mouth every 8 (eight) hours as needed for moderate pain. Per pain management  . insulin aspart (NOVOLOG) 100 UNIT/ML injection Inject 14-24 Units into the skin 3 (three) times daily with meals. Reported on 09/20/2015  . Insulin Detemir (LEVEMIR FLEXPEN) 100 UNIT/ML Pen Inject 60 Units into the skin daily at 10 pm.  . Insulin Pen Needle (BD PEN NEEDLE NANO U/F) 32G X 4 MM MISC 1 each by Does not apply route 3 (three) times daily.  [name] lisinopril (PRINIVIL,ZESTRIL) 30 MG tablet TAKE ONE TABLET BY MOUTH ONCE DAILY  . metFORMIN (GLUCOPHAGE-XR) 500 MG 24 hr tablet Take 500 mg by mouth 2 (two) times daily.   . pantoprazole (PROTONIX) 40 MG tablet Take 40 mg by mouth daily.   . predniSONE (DELTASONE) 10 MG tablet Take 1 tablet (10 mg total) by mouth daily with breakfast. (Patient taking differently: Take 5 mg by mouth daily with breakfast. )  . ranitidine (ZANTAC) 150 MG capsule Take 150 mg by mouth 2 (two) times daily.   . [DISCONTINUED] gabapentin (NEURONTIN) 600 MG tablet Take 600 mg by mouth 2 (two) times daily.  [name] zolpidem (AMBIEN) 5 MG tablet Take 1 tablet (5 mg total) by mouth at bedtime as needed for sleep.   No  facility-administered encounter medications on file as of 01/25/2016.     Allergies: No Known Allergies  Family History: Family History  Problem Relation Age of Onset  . Hypertension Mother   . Diabetes Mother   . Cancer Mother   . COPD Mother   . Hypertension Father   . Diabetes Father   . Heart Problems Father   . COPD Father     Social History: Social History  Substance Use Topics  . Smoking status: Never Smoker   . Smokeless tobacco: Never Used  . Alcohol Use: No   Social History   Social History Narrative   Lives alone in a one story home.  Has 8 children.  Does not work.  On disability.  Education: college.    Review of Systems:  CONSTITUTIONAL: No fevers, chills, night sweats, or weight loss.   EYES: No visual changes or eye pain ENT: No hearing changes.  No history of nose bleeds.   RESPIRATORY: No cough, wheezing and shortness  of breath.   CARDIOVASCULAR: Negative for chest pain, and palpitations.   GI: Negative for abdominal discomfort, blood in stools or black stools.  No recent change in bowel habits.   GU:  No history of incontinence.   MUSCLOSKELETAL: +history of joint pain or swelling.  No myalgias.   SKIN: Negative for lesions, rash, and itching.   HEMATOLOGY/ONCOLOGY: Negative for prolonged bleeding, bruising easily, and swollen nodes.  No history of cancer.   ENDOCRINE: Negative for cold or heat intolerance, polydipsia or goiter.   PSYCH:  + depression or anxiety symptoms.   NEURO: As Above.   Vital Signs:  BP 100/68 mmHg  Pulse 77  Ht 5' 6" (1.676 m)  Wt 196 lb 8 oz (89.132 kg)  BMI 31.73 kg/m2  SpO2 96%   General Medical Exam:   General:  Well appearing, comfortable.   Eyes/ENT: see cranial nerve examination.   Neck: No masses appreciated.  Full range of motion without tenderness.  No carotid bruits. Respiratory:  Clear to auscultation, good air entry bilaterally.   Cardiac:  Regular rate and rhythm, no murmur.   Extremities:  No  deformities, edema, or skin discoloration.  Skin:  No rashes or lesions.  Neurological Exam: MENTAL STATUS including orientation to time, place, person, recent and remote memory, attention span and concentration, language, and fund of knowledge is normal.  Speech is not dysarthric.  CRANIAL NERVES: II:  No visual field defects.  Unremarkable fundi.   III-IV-VI: Pupils equal round and reactive to light.  Normal conjugate, extra-ocular eye movements in all directions of gaze.  No nystagmus.  No ptosis.   V:  Normal facial sensation.   VII:  Normal facial symmetry and movements.  VIII:  Normal hearing and vestibular function.   IX-X:  Normal palatal movement.   XI:  Normal shoulder shrug and head rotation.   XII:  Normal tongue strength and range of motion, no deviation or fasciculation.  MOTOR:  No atrophy, fasciculations or abnormal movements.  No pronator drift.  Tone is normal.    Right Upper Extremity:    Left Upper Extremity:    Deltoid  5/5   Deltoid  5/5   Biceps  5/5   Biceps  5/5   Triceps  5/5   Triceps  5/5   Wrist extensors  5/5   Wrist extensors  5/5   Wrist flexors  5/5   Wrist flexors  5/5   Finger extensors  5/5   Finger extensors  5/5   Finger flexors  5/5   Finger flexors  5/5   Dorsal interossei  5/5   Dorsal interossei  5/5   Abductor pollicis  5/5   Abductor pollicis  5/5   Tone (Ashworth scale)  0  Tone (Ashworth scale)  0   Right Lower Extremity:    Left Lower Extremity:    Hip flexors  5/5   Hip flexors  5/5   Hip extensors  5/5   Hip extensors  5/5   Knee flexors  5/5   Knee flexors  5/5   Knee extensors  5/5   Knee extensors  5/5   Dorsiflexors  5/5   Dorsiflexors  5/5   Plantarflexors  5/5   Plantarflexors  5/5   Toe extensors  5/5   Toe extensors  5/5   Toe flexors  5/5   Toe flexors  5/5   Tone (Ashworth scale)  0  Tone (Ashworth scale)  0   MSRs:  Right                                                                 Left brachioradialis 2+   brachioradialis 2+  biceps 2+  biceps 2+  triceps 2+  triceps 2+  patellar 2+  patellar 2+  ankle jerk 2+  ankle jerk 2+  Hoffman no  Hoffman no  plantar response down  plantar response down   SENSORY:  Normal and symmetric perception of light touch, pinprick, vibration, and proprioception.  Romberg's sign absent.   COORDINATION/GAIT: Normal finger-to- nose-finger and heel-to-shin.  Intact rapid alternating movements bilaterally.  Able to rise from a chair without using arms.  Antalgic gait.      IMPRESSION: Mr. Frisina is a 60 year-old gentleman referred for evaluation of diabetic polyneuropathy.  He most likely has more small fiber involvement, because his NCS/EMG only showed absent superficial peroneal nerves, with all other sensory and motor studies being normal.  He has previously been on gabapentin and recently tried on Lyrica, but I am unsure whether he was on a therapeutic dose, since he denies side effect.  He certainly has room to increase his gabapentin higher, if he was only on gabapentin 635m BID.  Currently, he is seeing pain management who is writing his medications, so I will defer to their expertise as to avoid multiple providers managing his pain.  My recommendation is to optimize one medication whether it be increasing gabapentin up to 12015mTID, if tolerable, or titrate Lyrica to a therapeutic dose.  Other options going forward include adding nortriptyline, Cymbalta, or lidocaine ointment.   I am happy to write for non-narcotic medications, if this does not violate any contract he may have with his pain management provider.    The duration of this appointment visit was 45 minutes of face-to-face time with the patient.  Greater than 50% of this time was spent in counseling, explanation of diagnosis, planning of further management, and coordination of care.   Thank you for allowing me to participate in patient's care.  If I can answer any additional questions, I would be pleased  to do so.    Sincerely,     K. PaPosey ProntoDO

## 2016-01-31 ENCOUNTER — Telehealth: Payer: Self-pay | Admitting: Internal Medicine

## 2016-01-31 NOTE — Telephone Encounter (Signed)
Spoke with patient- he states Dr Adrian Blackwater requested he call our office for his BiPAP concerns. Pt states the machine is not working well for him. Pt is aware that he will need a sleep consult with 1 of the sleep MD's here. Pt is unsure who order the BiPAP to start with and aware that I will reach out to his local DME [facility]) in the morning to see if the ordering provider could/would give order while waiting on appt with sleep MD here. Pt aware I will contact him tomorrow. Thanks.

## 2016-02-01 ENCOUNTER — Other Ambulatory Visit: Payer: Self-pay | Admitting: Internal Medicine

## 2016-02-01 ENCOUNTER — Other Ambulatory Visit: Payer: Self-pay | Admitting: Family Medicine

## 2016-02-01 NOTE — Telephone Encounter (Signed)
Pt is aware that I have fit him in with CY on Thursday 02-07-16 at 4:15pm. Pt will be here at 4:00pm to check in and complete paper work.

## 2016-02-05 ENCOUNTER — Ambulatory Visit: Payer: Medicaid Other | Attending: Family Medicine | Admitting: Family Medicine

## 2016-02-05 ENCOUNTER — Encounter: Payer: Self-pay | Admitting: Family Medicine

## 2016-02-05 VITALS — BP 116/78 | HR 78 | Temp 99.1°F | Resp 12 | Ht 66.0 in | Wt 196.4 lb

## 2016-02-05 DIAGNOSIS — Z7982 Long term (current) use of aspirin: Secondary | ICD-10-CM | POA: Diagnosis not present

## 2016-02-05 DIAGNOSIS — Z7984 Long term (current) use of oral hypoglycemic drugs: Secondary | ICD-10-CM | POA: Diagnosis not present

## 2016-02-05 DIAGNOSIS — Z794 Long term (current) use of insulin: Secondary | ICD-10-CM | POA: Insufficient documentation

## 2016-02-05 DIAGNOSIS — K279 Peptic ulcer, site unspecified, unspecified as acute or chronic, without hemorrhage or perforation: Secondary | ICD-10-CM | POA: Insufficient documentation

## 2016-02-05 DIAGNOSIS — I10 Essential (primary) hypertension: Secondary | ICD-10-CM | POA: Diagnosis not present

## 2016-02-05 DIAGNOSIS — R079 Chest pain, unspecified: Secondary | ICD-10-CM | POA: Diagnosis not present

## 2016-02-05 DIAGNOSIS — R140 Abdominal distension (gaseous): Secondary | ICD-10-CM | POA: Diagnosis not present

## 2016-02-05 DIAGNOSIS — G8929 Other chronic pain: Secondary | ICD-10-CM | POA: Insufficient documentation

## 2016-02-05 DIAGNOSIS — G4733 Obstructive sleep apnea (adult) (pediatric): Secondary | ICD-10-CM | POA: Diagnosis not present

## 2016-02-05 DIAGNOSIS — E119 Type 2 diabetes mellitus without complications: Secondary | ICD-10-CM

## 2016-02-05 DIAGNOSIS — Z79899 Other long term (current) drug therapy: Secondary | ICD-10-CM | POA: Diagnosis not present

## 2016-02-05 LAB — GLUCOSE, POCT (MANUAL RESULT ENTRY): POC GLUCOSE: 174 mg/dL — AB (ref 70–99)

## 2016-02-05 LAB — POCT GLYCOSYLATED HEMOGLOBIN (HGB A1C): Hemoglobin A1C: 7.5

## 2016-02-05 MED ORDER — GUAIFENESIN ER 600 MG PO TB12: 600.0000 mg | ORAL_TABLET | Freq: Two times a day (BID) | ORAL | Status: DC | PRN

## 2016-02-05 MED ORDER — PANTOPRAZOLE SODIUM 40 MG PO TBEC: 40.0000 mg | DELAYED_RELEASE_TABLET | Freq: Every day | ORAL | Status: DC

## 2016-02-05 MED ORDER — RANITIDINE HCL 150 MG PO TABS: 150.0000 mg | ORAL_TABLET | Freq: Every day | ORAL | Status: DC

## 2016-02-05 NOTE — Assessment & Plan Note (Signed)
Improved with weight loss Will continue current regimen

## 2016-02-05 NOTE — Assessment & Plan Note (Signed)
Bloating with early satiety and weight loss  Opiod induced constipation possible Diabetic gastroparesis less likely as patient's diabetes is well controlled  Plan: Continue linzess F/u in 4 weeks if her remains symptomatic and has continued weight loss I have advised that he f/u with his gastroenterologist

## 2016-02-05 NOTE — Assessment & Plan Note (Addendum)
A: chest pain with shortness of breath. He has had a recent extensive cardiac w/u that is negative. His CBGs and diabetes is controlled. He has known OSA is not tolerating CPAP. Ambulatory pulse Ox is normal   P: Keep appt with pulmonlogy mucinex for congestion.  No need for inhalers at this time  DG esophagram to evaluate reflux continue protonix and zantac on daily

## 2016-02-05 NOTE — Progress Notes (Signed)
Pt here for body pain and F/U.Pt CBG is 174 and A1C is 7.5. Pt reports mid chest pain rated at a 5 described as tight, sharp, and aching. Pain has been present over a year and is constant. Pt takes hydrocodone for the pain and it helps some. Pt took a hydrocodone this morning for the pain. Pt has taken morning medications. Pt does not need any refills. Pt has also eaten this morning. Ambulatory O2 sat 96-97%.

## 2016-02-05 NOTE — Patient Instructions (Addendum)
Marco [name] was seen today for pain.  Diagnoses and all orders for this visit:  Type 2 diabetes mellitus without complication, with long-term current use of insulin (HCC) -     HgB A1c -     Glucose (CBG)  Chest pain, unspecified chest pain type -     DG Esophagus; Future -     guaiFENesin (MUCINEX) 600 MG 12 hr tablet; Take 1 tablet (600 mg total) by mouth 2 (two) times daily as needed.  PUD (peptic ulcer disease) -     pantoprazole (PROTONIX) 40 MG tablet; Take 1 tablet (40 mg total) by mouth daily. -     ranitidine (ZANTAC) 150 MG tablet; Take 1 tablet (150 mg total) by mouth at bedtime.   Your diabetes is greatly improved, well done, for this reason  gastroparesis is less likely. Continue linzess for next 4 weeks if you do not have significant improvement in your GI symptoms I recommend follow up with Dr. Henrene Pastor    F/u in 6 weeks   Dr. Adrian Blackwater

## 2016-02-05 NOTE — Progress Notes (Signed)
Subjective:  Patient ID: Marco [name], male    DOB: 09-12-1955  Age: 60 y.o. MRN: [id number]  CC: Pain   HPI Anav Lammert has diabetes, arthritis, HTN, chronic pain, OSA he presents for    1. Chest pain: sharp stabbing pains associated with chest tightness ongoing for the past  Year.  Pain is exacerbated by talking and by walking. When he chest tightness he feels weak. No cough. Has congestion. He feels like the symptoms are getting worse. He denies worsening reflux. He is taking protonix and zantac once daily. He has undergone cardiac evaluation that was negative for coronary artery and structural hear disease in 11/2015.   2. Diabetes: CBG 80-170s. No low CBGs. Compliant with lantus and novolog. Is eating less due to bloating and early satiety over the past month and has lost weight. He went to a different primary doctor yesterday and was prescribed linzess for suspected opiod induced constipation. He has started it and noticed loose stools.   Social History  Substance Use Topics  . Smoking status: Never Smoker   . Smokeless tobacco: Never Used  . Alcohol Use: No    Outpatient Prescriptions Prior to Visit  Medication Sig Dispense Refill  . ACCU-CHEK FASTCLIX LANCETS MISC 1 each by Other route 3 (three) times daily. 102 each 11  . allopurinol (ZYLOPRIM) 100 MG tablet Take 1 tablet (100 mg total) by mouth daily. 30 tablet 1  . atorvastatin (LIPITOR) 40 MG tablet TAKE ONE TABLET BY MOUTH ONCE DAILY 90 tablet 1  . Blood Glucose Monitoring Suppl (ACCU-CHEK AVIVA PLUS) W/DEVICE KIT 1 Device by Does not apply route 3 (three) times daily after meals. E11.9 1 kit 0  . Blood Glucose Monitoring Suppl (ACCU-CHEK AVIVA PLUS) W/DEVICE KIT Used as directed 1 kit 0  . Capsaicin 0.1 % CREA Apply 1 application topically once. 56.6 g 2  . carvedilol (COREG) 12.5 MG tablet Take 1 tablet (12.5 mg total) by mouth 2 (two) times daily. 60 tablet 2  . diltiazem (DILACOR XR) 180 MG 24 hr capsule TAKE ONE  CAPSULE BY MOUTH ONCE DAILY 30 capsule 2  . EQ ASPIRIN ADULT LOW DOSE 81 MG EC tablet TAKE ONE TABLET BY MOUTH ONCE DAILY 90 tablet 0  . furosemide (LASIX) 40 MG tablet TAKE ONE TABLET BY MOUTH ONCE DAILY 30 tablet 0  . glucose blood (ACCU-CHEK AVIVA PLUS) test strip 1 each by Other route 3 (three) times daily. E11.9 100 each 12  . HYDROcodone-acetaminophen (NORCO/VICODIN) 5-325 MG tablet Take 1 tablet by mouth every 8 (eight) hours as needed for moderate pain. Per pain management    . insulin aspart (NOVOLOG) 100 UNIT/ML injection Inject 14-24 Units into the skin 3 (three) times daily with meals. Reported on 09/20/2015    . Insulin Detemir (LEVEMIR FLEXPEN) 100 UNIT/ML Pen Inject 60 Units into the skin daily at 10 pm. 15 mL 11  . Insulin Pen Needle (BD PEN NEEDLE NANO U/F) 32G X 4 MM MISC 1 each by Does not apply route 3 (three) times daily. 100 each 11  . lisinopril (PRINIVIL,ZESTRIL) 30 MG tablet TAKE ONE TABLET BY MOUTH ONCE DAILY 30 tablet 3  . metFORMIN (GLUCOPHAGE-XR) 500 MG 24 hr tablet Take 500 mg by mouth 2 (two) times daily.     . pantoprazole (PROTONIX) 40 MG tablet Take 40 mg by mouth daily.  60 tablet 4  . predniSONE (DELTASONE) 10 MG tablet Take 1 tablet (10 mg total) by mouth daily with breakfast. (Patient  taking differently: Take 5 mg by mouth daily with breakfast. ) 30 tablet 1  . ranitidine (ZANTAC) 150 MG capsule Take 150 mg by mouth 2 (two) times daily.  60 capsule 0  . ranitidine (ZANTAC) 150 MG capsule TAKE ONE CAPSULE BY MOUTH TWICE DAILY 60 capsule 0  . zolpidem (AMBIEN) 5 MG tablet Take 1 tablet (5 mg total) by mouth at bedtime as needed for sleep. 30 tablet 5   No facility-administered medications prior to visit.    ROS Review of Systems  Constitutional: Positive for unexpected weight change. Negative for fever, chills and fatigue.  HENT:       Dry mouth   Eyes: Negative for visual disturbance.  Respiratory: Negative for cough and shortness of breath.     Cardiovascular: Positive for chest pain. Negative for palpitations and leg swelling.  Gastrointestinal: Negative for nausea, vomiting, abdominal pain, diarrhea, constipation, blood in stool, abdominal distention, anal bleeding and rectal pain.  Endocrine: Negative for polydipsia, polyphagia and polyuria.  Musculoskeletal: Negative for myalgias, back pain, arthralgias, gait problem and neck pain.  Skin: Negative for rash.  Allergic/Immunologic: Negative for immunocompromised state.  Neurological: Positive for numbness (hands ) and headaches. Negative for dizziness and weakness.  Hematological: Negative for adenopathy. Does not bruise/bleed easily.  Psychiatric/Behavioral: Negative for suicidal ideas, sleep disturbance and dysphoric mood. The patient is not nervous/anxious.     Objective:  BP 116/78 mmHg  Pulse 78  Temp(Src) 99.1 F (37.3 C) (Oral)  Resp 12  Ht '5\' 6"'$  (1.676 m)  Wt 196 lb 6.4 oz (89.086 kg)  BMI 31.71 kg/m2  SpO2 96% Ambulatory pulse ox 97%  BP/Weight 02/05/2016 01/25/2016 9/79/8921  Systolic BP 194 174 081  Diastolic BP 78 68 84  Wt. (Lbs) 196.4 196.5 -  BMI 31.71 31.73 -   Wt Readings from Last 3 Encounters:  02/05/16 196 lb 6.4 oz (89.086 kg)  01/25/16 196 lb 8 oz (89.132 kg)  11/16/15 216 lb 1.9 oz (98.031 kg)    Physical Exam  Constitutional: He appears well-developed and well-nourished. No distress.  HENT:  Head: Normocephalic and atraumatic.  Mostly edentulous   Neck: Normal range of motion. Neck supple.  Cardiovascular: Normal rate, regular rhythm, normal heart sounds and intact distal pulses.   Pulmonary/Chest: Effort normal and breath sounds normal.  Abdominal: Soft. Bowel sounds are normal. He exhibits no distension and no mass. There is tenderness in the periumbilical area. There is no rebound and no guarding.  Musculoskeletal: He exhibits no edema.  Neurological: He is alert.  Skin: Skin is warm and dry. No rash noted. No erythema.   Psychiatric: He has a normal mood and affect.   Lab Results  Component Value Date   HGBA1C 12.10 09/20/2015   Lab Results  Component Value Date   HGBA1C 7.5 02/05/2016    CBG 174  Assessment & Plan:   There are no diagnoses linked to this encounter. Maher was seen today for pain.  Diagnoses and all orders for this visit:  Type 2 diabetes mellitus without complication, with long-term current use of insulin (HCC) -     HgB A1c -     Glucose (CBG)  Chest pain, unspecified chest pain type -     DG Esophagus; Future -     guaiFENesin (MUCINEX) 600 MG 12 hr tablet; Take 1 tablet (600 mg total) by mouth 2 (two) times daily as needed.  PUD (peptic ulcer disease) -     pantoprazole (PROTONIX) 40 MG  tablet; Take 1 tablet (40 mg total) by mouth daily. -     ranitidine (ZANTAC) 150 MG tablet; Take 1 tablet (150 mg total) by mouth at bedtime.  Abdominal bloating   No orders of the defined types were placed in this encounter.    Follow-up: No Follow-up on file.   Boykin Nearing MD

## 2016-02-07 ENCOUNTER — Ambulatory Visit (INDEPENDENT_AMBULATORY_CARE_PROVIDER_SITE_OTHER): Payer: Medicaid Other | Admitting: Internal Medicine

## 2016-02-07 ENCOUNTER — Encounter: Payer: Self-pay | Admitting: Internal Medicine

## 2016-02-07 ENCOUNTER — Ambulatory Visit (HOSPITAL_COMMUNITY)
Admission: RE | Admit: 2016-02-07 | Discharge: 2016-02-07 | Disposition: A | Discharge status: Home / Self Care | Payer: Medicaid Other | Source: Ambulatory Visit | Attending: Family Medicine | Admitting: Family Medicine

## 2016-02-07 VITALS — BP 102/68 | HR 67 | Ht 66.0 in | Wt 198.6 lb

## 2016-02-07 DIAGNOSIS — R609 Edema, unspecified: Secondary | ICD-10-CM | POA: Diagnosis not present

## 2016-02-07 DIAGNOSIS — G4700 Insomnia, unspecified: Secondary | ICD-10-CM | POA: Diagnosis not present

## 2016-02-07 DIAGNOSIS — R079 Chest pain, unspecified: Secondary | ICD-10-CM | POA: Diagnosis not present

## 2016-02-07 DIAGNOSIS — G4733 Obstructive sleep apnea (adult) (pediatric): Secondary | ICD-10-CM

## 2016-02-07 DIAGNOSIS — R0609 Other forms of dyspnea: Secondary | ICD-10-CM

## 2016-02-07 MED ORDER — ESZOPICLONE 2 MG PO TABS: ORAL_TABLET | ORAL | Status: DC

## 2016-02-07 NOTE — Progress Notes (Signed)
02/07/2016-60 year old male former smoker establishing here for help with OSA FOLLOW FOR: Needs CPAP supplies, having a lot of SOB, DOE, Chest pain, chest tightness, had a cardio work up and heart was fine, hurts to swallow, did barium swallow and it was fine  Had sleep study done on 06/14/15 and 08/14/15.  not sleeping well, pain specialist told patient not to take Ambien due to sleep apnea dx. Sleep, fatigued all the time. Hx of Afib, Cardio Ablation. Epworth Score: 6 NPSG 37.8/hour on 06/14/2015, desaturation to 76%, body weight 223 pounds BiPAP titration 08/19/2015- to 20/16 He is especially concerned about difficulty falling asleep and staying asleep with subsequent daytime sleepiness. Bedtime between 1 AM and 2 AM, estimating 45-60 minutes sleep latency times many years. Wakes 2 or 3 times per night before getting up at 11 AM. Trying BiPAP but says that over dries and can't wear more than 2 hours a night. Using full face mask with humidifier turned up. No ENT surgery Also concerned: Easy dyspnea on exertion over the past few months. Sternal soreness. Negative UGI. 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 Medical problems include rheumatoid arthritis, psoriasis, CAD/PAF  Prior to Admission medications   Medication Sig Start Date End Date Taking? Authorizing Provider  ACCU-CHEK FASTCLIX LANCETS MISC 1 each by Other route 3 (three) times daily. 06/19/15  Yes Josalyn Funches, MD  allopurinol (ZYLOPRIM) 100 MG tablet Take 1 tablet (100 mg total) by mouth daily. 10/12/15  Yes Josalyn Funches, MD  atorvastatin (LIPITOR) 40 MG tablet TAKE ONE TABLET BY MOUTH ONCE DAILY 08/07/15  Yes Josalyn Funches, MD  Blood Glucose Monitoring Suppl (ACCU-CHEK AVIVA PLUS) W/DEVICE KIT 1 Device by Does not apply route 3 (three) times daily after meals. E11.9 06/14/15  Yes Josalyn Funches, MD  Blood Glucose Monitoring Suppl (ACCU-CHEK AVIVA PLUS) W/DEVICE KIT Used as directed 06/14/15  Yes Josalyn Funches, MD   Capsaicin 0.1 % CREA Apply 1 application topically once. 12/17/15  Yes Titorya Stover, DPM  carvedilol (COREG) 12.5 MG tablet Take 1 tablet (12.5 mg total) by mouth 2 (two) times daily. 11/07/15  Yes Josalyn Funches, MD  diltiazem (DILACOR XR) 180 MG 24 hr capsule TAKE ONE CAPSULE BY MOUTH ONCE DAILY 12/11/15  Yes Josalyn Funches, MD  EQ ASPIRIN ADULT LOW DOSE 81 MG EC tablet TAKE ONE TABLET BY MOUTH ONCE DAILY 01/09/16  Yes Josalyn Funches, MD  furosemide (LASIX) 40 MG tablet TAKE ONE TABLET BY MOUTH ONCE DAILY 01/09/16  Yes Josalyn Funches, MD  glucose blood (ACCU-CHEK AVIVA PLUS) test strip 1 each by Other route 3 (three) times daily. E11.9 06/14/15  Yes Josalyn Funches, MD  HYDROcodone-acetaminophen (NORCO/VICODIN) 5-325 MG tablet Take 1 tablet by mouth every 8 (eight) hours as needed for moderate pain. Per pain management   Yes Historical Provider, MD  insulin aspart (NOVOLOG) 100 UNIT/ML injection Inject 14-24 Units into the skin 3 (three) times daily with meals. Reported on 09/20/2015   Yes Historical Provider, MD  Insulin Detemir (LEVEMIR FLEXPEN) 100 UNIT/ML Pen Inject 60 Units into the skin daily at 10 pm. 02/23/15  Yes Josalyn Funches, MD  Insulin Pen Needle (BD PEN NEEDLE NANO U/F) 32G X 4 MM MISC 1 each by Does not apply route 3 (three) times daily. 08/08/15  Yes Josalyn Funches, MD  linaclotide (LINZESS) 145 MCG CAPS capsule Take 145 mcg by mouth daily before breakfast.   Yes Historical Provider, MD  lisinopril (PRINIVIL,ZESTRIL) 30 MG tablet TAKE ONE TABLET BY MOUTH  ONCE DAILY 11/26/15  Yes Josalyn Funches, MD  metFORMIN (GLUCOPHAGE-XR) 500 MG 24 hr tablet Take 500 mg by mouth 2 (two) times daily.  10/04/15  Yes Historical Provider, MD  pantoprazole (PROTONIX) 40 MG tablet Take 1 tablet (40 mg total) by mouth daily. 02/05/16  Yes Josalyn Funches, MD  predniSONE (DELTASONE) 10 MG tablet Take 1 tablet (10 mg total) by mouth daily with breakfast. Patient taking differently: Take 5 mg by mouth daily  with breakfast.  08/08/15  Yes Josalyn Funches, MD  ranitidine (ZANTAC) 150 MG tablet Take 1 tablet (150 mg total) by mouth at bedtime. 02/05/16  Yes Josalyn Funches, MD  eszopiclone (LUNESTA) 2 MG TABS tablet T1 at bedtime for sleep if needed 02/07/16   Deneise Lever, MD  guaiFENesin (MUCINEX) 600 MG 12 hr tablet Take 1 tablet (600 mg total) by mouth 2 (two) times daily as needed. Patient not taking: Reported on 02/07/2016 02/05/16   Boykin Nearing, MD   Past Medical History  Diagnosis Date  . Non-obstructive CAD     a. 02/2013 Cath ([place]): nonobs dzs;  b. 09/2014 Myoview ([place]): EF 55%, no ischemia;  c. 12/2014 Echo: EF 50-55%, gr1 DD, no effusion.  . Essential hypertension   . Diabetes mellitus without complication ([place])   . Rheumatoid arthritis ([place])   . Chronic pain   . PAF (paroxysmal atrial fibrillation) ([place])     a. 02/2013 s/p rfca in [place], [place]-->prev on Xarelto, d/c'd 2/2 anemia, ? GIB.  [name] Benign colon polyp 08/01/2013    [facility] in [place]. large base tranverse colon polyp was biopsied. polyp was benign with minimal surface hyperplastic change.  . Morbid obesity ([place])   . Gastritis 07/29/2013    confirmed on EGD, bx done an negative for intestinal metaplasia, dsyplasia or H. pylori. normal gastric emptying study done 07/13/2013.  [name] Esophageal hiatal hernia 07/29/2013    confirmed on EGD   . Sigmoid diverticulosis 08/01/2013    confirmed on colonscopy. record scanned into chart  . GERD (gastroesophageal reflux disease)   . Esophageal stricture   . Diverticulosis    Past Surgical History  Procedure Laterality Date  . Colon surgery  09/2014    colon resection   . Cardiac catheterization  05/2014    ablation for atrial fibrillation  . Colon resection  09/2013    due to large, abnormal polpy. non cancerous per patient.   . Cardiac catheterization N/A 11/16/2015    Procedure: Left Heart Cath and Coronary Angiography;  Surgeon: Leonie Man, MD;  Location: [facility];  Service: Cardiovascular;  Laterality: N/A;   Family History  Problem Relation Age of Onset  . Hypertension Mother   . Diabetes Mother   . Cancer Mother   . COPD Mother   . Hypertension Father   . Diabetes Father   . Heart Problems Father   . COPD Father    Social History   Social History  . Marital Status: Married    Spouse Name: N/A  . Number of Children: N/A  . Years of Education: N/A   Occupational History  . Not on file.   Social History Main Topics  . Smoking status: Never Smoker   . Smokeless tobacco: Never Used  . Alcohol Use: No  . Drug Use: No  . Sexual Activity: Not on file   Other Topics Concern  . Not on file   Social History Narrative   Lives alone in a  one story home.  Has 8 children.  Does not work.  On disability.  Education: college.   ROS-see HPI   Negative unless "+" Constitutional:    weight loss, night sweats, fevers, chills, fatigue, lassitude. HEENT:    headaches, + difficulty swallowing, tooth/dental problems, sore throat,       sneezing, itching, ear ache, nasal congestion, post nasal drip, snoring CV:    + chest pain, orthopnea, PND, + swelling in lower extremities, anasarca,                                                          dizziness, palpitations Resp:  + shortness of breath with exertion or at rest.                productive cough,   non-productive cough, coughing up of blood.              change in color of mucus.  wheezing.   Skin:    rash or lesions. GI:  No-   heartburn, indigestion, + abdominal pain, nausea, vomiting, diarrhea,                 change in bowel habits, loss of appetite GU: dysuria, change in color of urine, no urgency or frequency.   flank pain. MS:   + joint pain, stiffness, decreased range of motion, back pain. Neuro-     nothing unusual Psych:  change in mood or affect.  depression or anxiety.   memory loss.  OBJ- Physical Exam General- Alert, Oriented, Affect-appropriate, Distress-  none acute Skin- rash-none, lesions- none, excoriation- none Lymphadenopathy- none Head- atraumatic            Eyes- Gross vision intact, PERRLA, conjunctivae and secretions clear            Ears- Hearing, canals-normal            Nose- Clear, no-Septal dev, mucus, polyps, erosion, perforation             Throat- Mallampati IV , mucosa clear , drainage- none, tonsils- atrophic, + missing teeth Neck- flexible , trachea midline, no stridor , thyroid nl, carotid no bruit Chest - symmetrical excursion , unlabored           Heart/CV- RRR , no murmur , no gallop  , no rub, nl s1 s2                           - JVD- none , edema- none, stasis changes- none, varices- none           Lung- clear to P&A, wheeze- none, cough- none , dullness-none, rub- none           Chest wall-  Abd-  Br/ Gen/ Rectal- Not done, not indicated Extrem- cyanosis- none, clubbing, none, atrophy- none, strength- nl Neuro- grossly intact to observation

## 2016-02-07 NOTE — Patient Instructions (Addendum)
Order- DME  [facility]   Change BIPAP to 16/16, mask of choice, humidifier, supplies, AirView   Dx OSA                         Suggest you try otc Biotene mouth rinse at bedtime to see if that helps the dry mouth discomfort  Script lunesta  2 mg  Printed   Try one at bedtime for sleep if needed   Order- schedule PFT and 6 MWT     Dx dyspnea with exertion  Order- lab- out patient    D-dimer     Dx dyspnea with exertion

## 2016-02-08 ENCOUNTER — Other Ambulatory Visit: Payer: Medicaid Other

## 2016-02-08 ENCOUNTER — Telehealth: Payer: Self-pay | Admitting: *Deleted

## 2016-02-08 DIAGNOSIS — R0609 Other forms of dyspnea: Principal | ICD-10-CM

## 2016-02-08 DIAGNOSIS — R7989 Other specified abnormal findings of blood chemistry: Secondary | ICD-10-CM

## 2016-02-08 LAB — D-DIMER, QUANTITATIVE: D-Dimer, Quant: 0.74 mcg/mL FEU — ABNORMAL HIGH (ref <0.50)

## 2016-02-08 NOTE — Telephone Encounter (Signed)
Result Note     D-dimer test for blood clots is higher than normal, so active clot can't be ruled out. The score is less than a year ago.        Recommend schedule bilateral dopplers leg veins for next week   Dx Hx DVT, abnormal D-dimer   ---   Called made pt aware of recs above. Order placed. Nothing further needed

## 2016-02-08 NOTE — Telephone Encounter (Signed)
Called report from [facility]. Pt d-dimer was 0.74. Please advise Dr. Annamaria Boots thanks

## 2016-02-09 ENCOUNTER — Other Ambulatory Visit: Payer: Self-pay | Admitting: Family Medicine

## 2016-02-09 DIAGNOSIS — R0602 Shortness of breath: Secondary | ICD-10-CM | POA: Insufficient documentation

## 2016-02-09 NOTE — Assessment & Plan Note (Signed)
Complaint is not well defined. He has had cardiac evaluation. Plan-PFT and 6 minute walk test. Lab for d-dimer

## 2016-02-09 NOTE — Assessment & Plan Note (Addendum)
He is not tolerating current BiPAP settings at least partly because of dryness despite humidifier. We will try a lower pressure and reassess. Plan-reduce BiPAP to 16/16. For dry mouth complaint try Biotene

## 2016-02-09 NOTE — Assessment & Plan Note (Signed)
Discussed good sleep hygiene and sleep environment. He is going to need some help accommodating to CPAP. Plan-Lunesta with discussion

## 2016-02-11 ENCOUNTER — Ambulatory Visit (HOSPITAL_COMMUNITY)
Admission: RE | Admit: 2016-02-11 | Discharge: 2016-02-11 | Disposition: A | Discharge status: Home / Self Care | Payer: Medicaid Other | Source: Ambulatory Visit | Attending: Vascular Surgery | Admitting: Vascular Surgery

## 2016-02-11 ENCOUNTER — Telehealth: Payer: Self-pay | Admitting: *Deleted

## 2016-02-11 ENCOUNTER — Telehealth: Payer: Self-pay | Admitting: Internal Medicine

## 2016-02-11 ENCOUNTER — Encounter (HOSPITAL_COMMUNITY): Payer: Medicaid Other

## 2016-02-11 DIAGNOSIS — K219 Gastro-esophageal reflux disease without esophagitis: Secondary | ICD-10-CM | POA: Insufficient documentation

## 2016-02-11 DIAGNOSIS — M069 Rheumatoid arthritis, unspecified: Secondary | ICD-10-CM | POA: Diagnosis not present

## 2016-02-11 DIAGNOSIS — R609 Edema, unspecified: Secondary | ICD-10-CM | POA: Insufficient documentation

## 2016-02-11 DIAGNOSIS — I480 Paroxysmal atrial fibrillation: Secondary | ICD-10-CM | POA: Insufficient documentation

## 2016-02-11 DIAGNOSIS — I10 Essential (primary) hypertension: Secondary | ICD-10-CM | POA: Insufficient documentation

## 2016-02-11 DIAGNOSIS — E119 Type 2 diabetes mellitus without complications: Secondary | ICD-10-CM | POA: Insufficient documentation

## 2016-02-11 DIAGNOSIS — R0609 Other forms of dyspnea: Secondary | ICD-10-CM | POA: Diagnosis not present

## 2016-02-11 DIAGNOSIS — I2510 Atherosclerotic heart disease of native coronary artery without angina pectoris: Secondary | ICD-10-CM | POA: Diagnosis not present

## 2016-02-11 NOTE — Telephone Encounter (Signed)
Ok to let Marco [name] know that the Doppler test was negative for blood clots in leg veins. If he has breathing problems let us know.

## 2016-02-11 NOTE — Telephone Encounter (Signed)
Pt states that he was told something about starting him on an inhaler at last OV or sending an Rx to his pharmacy for an inhaler but nothing was ever done. There is no mention on the AVS regarding an inhaler or Rx needing to be sent. Please advise Dr Annamaria Boots. Thanks.       Medication List       This list is accurate as of: 02/11/16 12:41 PM.  Always use your most recent med list.               ACCU-CHEK AVIVA PLUS w/Device Kit  1 Device by Does not apply route 3 (three) times daily after meals. E11.9     ACCU-CHEK AVIVA PLUS w/Device Kit  Used as directed     ACCU-CHEK FASTCLIX LANCETS Misc  1 each by Other route 3 (three) times daily.     allopurinol 100 MG tablet  Commonly known as:  ZYLOPRIM  Take 1 tablet (100 mg total) by mouth daily.     atorvastatin 40 MG tablet  Commonly known as:  LIPITOR  TAKE ONE TABLET BY MOUTH ONCE DAILY     Capsaicin 0.1 % Crea  Apply 1 application topically once.     carvedilol 12.5 MG tablet  Commonly known as:  COREG  Take 1 tablet (12.5 mg total) by mouth 2 (two) times daily.     diltiazem 180 MG 24 hr capsule  Commonly known as:  DILACOR XR  TAKE ONE CAPSULE BY MOUTH ONCE DAILY     EQ ASPIRIN ADULT LOW DOSE 81 MG EC tablet  Generic drug:  aspirin  TAKE ONE TABLET BY MOUTH ONCE DAILY     eszopiclone 2 MG Tabs tablet  Commonly known as:  LUNESTA  T1 at bedtime for sleep if needed     furosemide 40 MG tablet  Commonly known as:  LASIX  TAKE ONE TABLET BY MOUTH ONCE DAILY     glucose blood test strip  Commonly known as:  ACCU-CHEK AVIVA PLUS  1 each by Other route 3 (three) times daily. E11.9     guaiFENesin 600 MG 12 hr tablet  Commonly known as:  MUCINEX  Take 1 tablet (600 mg total) by mouth 2 (two) times daily as needed.     HYDROcodone-acetaminophen 5-325 MG tablet  Commonly known as:  NORCO/VICODIN  Take 1 tablet by mouth every 8 (eight) hours as needed for moderate pain. Per pain management     insulin aspart 100  UNIT/ML injection  Commonly known as:  novoLOG  Inject 14-24 Units into the skin 3 (three) times daily with meals. Reported on 09/20/2015     Insulin Detemir 100 UNIT/ML Pen  Commonly known as:  LEVEMIR FLEXPEN  Inject 60 Units into the skin daily at 10 pm.     Insulin Pen Needle 32G X 4 MM Misc  Commonly known as:  BD PEN NEEDLE NANO U/F  1 each by Does not apply route 3 (three) times daily.     LINZESS 145 MCG Caps capsule  Generic drug:  linaclotide  Take 145 mcg by mouth daily before breakfast.     lisinopril 30 MG tablet  Commonly known as:  PRINIVIL,ZESTRIL  TAKE ONE TABLET BY MOUTH ONCE DAILY     metFORMIN 500 MG 24 hr tablet  Commonly known as:  GLUCOPHAGE-XR  Take 500 mg by mouth 2 (two) times daily.     pantoprazole 40 MG tablet  Commonly known as:  PROTONIX  Take 1 tablet (40 mg total) by mouth daily.     predniSONE 10 MG tablet  Commonly known as:  DELTASONE  Take 1 tablet (10 mg total) by mouth daily with breakfast.     ranitidine 150 MG tablet  Commonly known as:  ZANTAC  Take 1 tablet (150 mg total) by mouth at bedtime.

## 2016-02-11 NOTE — Addendum Note (Signed)
Addended by: Clayborne Dana C on: 02/11/2016 09:44 AM   Modules accepted: Orders, SmartSet

## 2016-02-11 NOTE — Telephone Encounter (Signed)
I spoke with patient about results and he verbalized understanding and had no questions 

## 2016-02-11 NOTE — Telephone Encounter (Signed)
Pt can be worked in on 05/13/16 at 11:15 am

## 2016-02-11 NOTE — Telephone Encounter (Signed)
Call report. Pt venous dopplers were negative for DVT.  Please advise Dr. Annamaria Boots thanks

## 2016-02-11 NOTE — Telephone Encounter (Signed)
Pt scheduled for 6MW and PFT on 02/22/16 - refused to wait until f/u in Sept.  Please advise where the patient can be placed on [facility] schedule for follow up around 05/09/16. Thanks.

## 2016-02-11 NOTE — Telephone Encounter (Signed)
I was going to wait until after his PFT, but that's ok.        Please go ahead and offer sample Anoro Ellipta- inhale 1 puff, once daily.

## 2016-02-11 NOTE — Telephone Encounter (Signed)
Spoke with pt, has acute symptoms and cannot wait until October to be seen. Pt scheduled for an acute ov with MW on Thursday at 10:45.  Verified appt for 92mw and pft.   Pt scheduled for October rov with CY.  Nothing further needed.

## 2016-02-11 NOTE — Telephone Encounter (Signed)
LMTCB

## 2016-02-14 ENCOUNTER — Ambulatory Visit (INDEPENDENT_AMBULATORY_CARE_PROVIDER_SITE_OTHER): Payer: Medicaid Other | Admitting: Internal Medicine

## 2016-02-14 ENCOUNTER — Telehealth: Payer: Self-pay

## 2016-02-14 ENCOUNTER — Encounter: Payer: Self-pay | Admitting: Internal Medicine

## 2016-02-14 VITALS — BP 106/78 | HR 69 | Ht 66.0 in | Wt 200.0 lb

## 2016-02-14 DIAGNOSIS — R0600 Dyspnea, unspecified: Secondary | ICD-10-CM

## 2016-02-14 DIAGNOSIS — I10 Essential (primary) hypertension: Secondary | ICD-10-CM

## 2016-02-14 MED ORDER — IRBESARTAN 150 MG PO TABS: 150.0000 mg | ORAL_TABLET | Freq: Every day | ORAL | Status: DC

## 2016-02-14 NOTE — Progress Notes (Signed)
02/07/2016-60 year old male former smoker establishing here for help with OSA FOLLOW FOR: Needs CPAP supplies, having a lot of SOB, DOE, Chest pain, chest tightness, had a cardio work up and heart was fine, hurts to swallow, did barium swallow and it was fine  Had sleep study done on 06/14/15 and 08/14/15.  not sleeping well, pain specialist told patient not to take Ambien due to sleep apnea dx. Sleep, fatigued all the time. Hx of Afib, Cardio Ablation. Epworth Score: 6 NPSG 37.8/hour on 06/14/2015, desaturation to 76%, body weight 223 pounds BiPAP titration 08/19/2015- to 20/16 He is especially concerned about difficulty falling asleep and staying asleep with subsequent daytime sleepiness. Bedtime between 1 AM and 2 AM, estimating 45-60 minutes sleep latency times many years. Wakes 2 or 3 times per night before getting up at 11 AM. Trying BiPAP but says that over dries and can't wear more than 2 hours a night. Using full face mask with humidifier turned up. No ENT surgery Also concerned: Easy dyspnea on exertion over the past few months. Sternal soreness. Negative UGI. 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 Medical problems include rheumatoid arthritis, psoriasis, CAD/PAF rec Order- DME  [facility]   Change BIPAP to 16/16, mask of choice, humidifier, supplies, AirView   Dx OSA                         Suggest you try otc Biotene mouth rinse at bedtime to see if that helps the dry mouth discomfort  Script lunesta  2 mg  Printed   Try one at bedtime for sleep if needed   Order- schedule PFT and 6 MWT     Dx dyspnea with exertion         02/14/2016 acute extended ov/Robin Petrakis re: unexplained sob on ACEi on ppi with bfast/ zantac daytime  Chief Complaint  Patient presents with  . Acute Visit    Increased DOE x 2-3 wks. He gets with exertion such as walking to the mailbox or taking a shower. He also c/o increased chest tightness.   sob gradually worse and occurs now at rest x sev minutes at  a time/ much worse speaking / never at hs on cpap/ min dry cough and throat clearing   No obvious day to day or daytime variability or assoc excess/ purulent sputum or mucus plugs or hemoptysis or cp or subjective wheeze or overt sinus or hb symptoms. No unusual exp hx or h/o childhood pna/ asthma or knowledge of premature birth.  Sleeping ok without nocturnal  or early am exacerbation  of respiratory  c/o's or need for noct saba. Also denies any obvious fluctuation of symptoms with weather or environmental changes or other aggravating or alleviating factors except as outlined above   Current Medications, Allergies, Complete Past Medical History, Past Surgical History, Family History, and Social History were reviewed in [employer] record.  ROS  The following are not active complaints unless bolded sore throat, dysphagia, dental problems, itching, sneezing,  nasal congestion or excess/ purulent secretions, ear ache,   fever, chills, sweats, unintended wt loss, classically pleuritic or exertional cp,  orthopnea pnd or leg swelling, presyncope, palpitations, abdominal pain, anorexia, nausea, vomiting, diarrhea  or change in bowel or bladder habits, change in stools or urine, dysuria,hematuria,  rash, arthralgias, visual complaints, headache, numbness, weakness or ataxia or problems with walking or coordination,  change in mood/affect or memory.  Pex  amb hoarse bm subtle pseudowheeze  Wt Readings from Last 3 Encounters:  02/14/16 200 lb (90.719 kg)  02/07/16 198 lb 9.6 oz (90.084 kg)  02/05/16 196 lb 6.4 oz (89.086 kg)    Vital signs reviewed  HEENT: nl dentition, turbinates, and oropharynx. Nl external ear canals without cough reflex   NECK :  without JVD/Nodes/TM/ nl carotid upstrokes bilaterally   LUNGS: no acc muscle use,  Nl contour chest which is clear to A and P bilaterally without cough on insp or exp maneuvers   CV:  RRR  no s3 or murmur or  increase in P2, no edema   ABD:  soft and nontender with nl inspiratory excursion in the supine position. No bruits or organomegaly, bowel sounds nl  MS:  Nl gait/ ext warm without deformities, calf tenderness, cyanosis or clubbing No obvious joint restrictions   SKIN: warm and dry without lesions    NEURO:  alert, approp, nl sensorium with  no motor deficits      I personally reviewed images and agree with radiology impression as follows:  CXR:  01/22/16 No active cardiopulmonary disease.     Labs ordered/ reviewed:      Chemistry      Component Value Date/Time   NA 141 01/22/2016 1700   K 3.1* 01/22/2016 1700   CL 99* 01/22/2016 1700   CO2 30 01/22/2016 1700   BUN 8 01/22/2016 1700   CREATININE 1.18 01/22/2016 1700   CREATININE 2.07* 02/20/2015 1525      Component Value Date/Time   CALCIUM 8.8* 01/22/2016 1700   ALKPHOS 38 01/22/2016 1700   AST 23 01/22/2016 1700   ALT 15* 01/22/2016 1700   BILITOT 0.8 01/22/2016 1700        Lab Results  Component Value Date   WBC 8.1 01/22/2016   HGB 13.8 01/22/2016   HCT 42.8 01/22/2016   MCV 91.1 01/22/2016   PLT 297 01/22/2016     Lab Results  Component Value Date   DDIMER 0.74* 02/08/2016      Lab Results  Component Value Date   TSH 0.757 12/23/2014          Lab Results  Component Value Date   ESRSEDRATE 20* 12/24/2014

## 2016-02-14 NOTE — Telephone Encounter (Signed)
Contacted pt and went over his esophogram results. Pt is aware of results

## 2016-02-14 NOTE — Assessment & Plan Note (Addendum)
Symptoms are markedly disproportionate to objective findings and not clear this is a lung problem but pt does appear to have difficult airway management issues. DDX of  difficult airways management almost all start with A and  include Adherence, Ace Inhibitors, Acid Reflux, Active Sinus Disease, Alpha 1 Antitripsin deficiency, Anxiety masquerading as Airways dz,  ABPA,  Allergy(esp in young), Aspiration (esp in elderly), Adverse effects of meds,  Active smokers, A bunch of PE's (a small clot burden can't cause this syndrome unless there is already severe underlying pulm or vascular dz with poor reserve) plus two Bs  = Bronchiectasis and Beta blocker use..and one C= CHF  Adherence is always the initial "prime suspect" and is a multilayered concern that requires a "trust but verify" approach in every patient - starting with knowing how to use medications, especially inhalers, correctly, keeping up with refills and understanding the fundamental difference between maintenance and prns vs those medications only taken for a very short course and then stopped and not refilled.   ACEi adverse effects at the  top of the usual list of suspects and the only way to rule it out is a trial off > see hbp   ? Acid (or non-acid) GERD > always difficult to exclude as up to 75% of pts in some series report no assoc GI/ Heartburn symptoms> rec max (24h)  acid suppression and diet restrictions/ reviewed and instructions given in writing.   ? Allergies/ asthma > absence of noct or early am symptoms make thisless likely   ? A bunch of pe's  D dimer nl - while  An upper limit of nl valute  may miss small peripheral pe, the clot burden with sob is moderately high and the d dimer has a very high neg pred value in this setting    ? BB effect > could certainly mimic asthma but don't see any evidence of asthma here   ? chf > 11/16/15 LHC Angiographically minimal CAD. Mild calcification noted extraluminal. The left ventricular  systolic function is normal. Normal LVEDP    For now focus on stop acei/ rx gerd/ f/u per Dr Annamaria Boots  I had an extended discussion with the patient reviewing all relevant studies completed to date and  lasting 25 minutes of a 40  minute extended acute office visit    Each maintenance medication was reviewed in detail including most importantly the difference between maintenance and prns and under what circumstances the prns are to be triggered using an action plan format that is not reflected in the computer generated alphabetically organized AVS.    Please see instructions for details which were reviewed in writing and the patient given a copy highlighting the part that I personally wrote and discussed at today's ov.

## 2016-02-14 NOTE — Assessment & Plan Note (Signed)
In the best review of chronic cough to date ( NEJM 2016 375 [id number]) ,  ACEi are now felt to cause cough in up to  20% of pts which is a 4 fold increase from previous reports and does not include the variety of non-specific complaints we see in pulmonary clinic in pts on ACEi but previously attributed to another dx like  Copd/asthma and  include PNDS, throat and chest congestion, "bronchitis", unexplained dyspnea and noct "strangling" sensations, and hoarseness, but also  atypical /refractory GERD symptoms like dysphagia and "bad heartburn"   The only way I know  to prove this is not an "ACEi Case" is a trial off ACEi x a minimum of 6 weeks then regroup.   rec trial of avapro 150 mg daily , ok to cut in half if too strong

## 2016-02-14 NOTE — Patient Instructions (Addendum)
Pantoprazole (protonix) 40 mg   Take  30-60 min before first meal of the day and Zantac 150  mg one @  bedtime until return to office - this is the best way to tell whether stomach acid is contributing to your problem.    GERD (REFLUX)  is an extremely common cause of respiratory symptoms just like yours , many times with no obvious heartburn at all.    It can be treated with medication, but also with lifestyle changes including elevation of the head of your bed (ideally with 6 inch  bed blocks),  Smoking cessation, avoidance of late meals, excessive alcohol, and avoid fatty foods, chocolate, peppermint, colas, red wine, and acidic juices such as orange juice.  NO MINT OR MENTHOL PRODUCTS SO NO COUGH DROPS  USE SUGARLESS CANDY INSTEAD (Jolley ranchers or Stover's or Life Savers) or even ice chips will also do - the key is to swallow to prevent all throat clearing. NO OIL BASED VITAMINS - use powdered substitutes.  Stop lisinopril and start Ibesartan 150 mg daily and your symptoms should gradually improve over several weeks and if not Dr Annamaria Boots will be seeing you back   If the ibesartan is too strong and you feel light headed standing, break in half and just take a half pill daily

## 2016-02-15 ENCOUNTER — Telehealth: Payer: Self-pay | Admitting: Internal Medicine

## 2016-02-15 MED ORDER — UMECLIDINIUM-VILANTEROL 62.5-25 MCG/INH IN AEPB: 1.0000 | INHALATION_SPRAY | Freq: Every day | RESPIRATORY_TRACT | Status: DC

## 2016-02-15 NOTE — Telephone Encounter (Signed)
Prior approval form done seeking Medicaid cverage for Lunesta. If denied, he will need to use otc- melatonin and benadryl or tylenol PM

## 2016-02-15 NOTE — Telephone Encounter (Signed)
See 02/11/16 TE regarding inhaler. Nothing further needed.

## 2016-02-15 NOTE — Addendum Note (Signed)
Addended by: Beckie Busing on: 02/15/2016 03:46 PM   Modules accepted: Orders

## 2016-02-15 NOTE — Telephone Encounter (Signed)
Spoke with pt and advised of CY's recommendation for Anoro. Pt agrees to try. Rx sent to pharmacy. Nothing further needed.

## 2016-02-15 NOTE — Telephone Encounter (Signed)
Opened in error.  New msg created.

## 2016-02-15 NOTE — Telephone Encounter (Signed)
lmtcb x2 for pt. 

## 2016-02-15 NOTE — Telephone Encounter (Signed)
Called [facility] to initiate PA for Lunesta 2mg  tabs, [phone] Member ID# [id number] [employer] with Margreta Journey, Prior Auth cannot be done for this medication over the phone d/ the type of medication it is.  Form printed from [employer] website for [employer] medications.   Form printed and placed on CY cart to fill out.  This will need to be faxed to [facility] at number on form Will send to [facility] to f/u

## 2016-02-21 ENCOUNTER — Telehealth: Payer: Self-pay | Admitting: Internal Medicine

## 2016-02-21 ENCOUNTER — Other Ambulatory Visit: Payer: Self-pay | Admitting: Internal Medicine

## 2016-02-21 MED ORDER — IPRATROPIUM-ALBUTEROL 20-100 MCG/ACT IN AERS: 1.0000 | INHALATION_SPRAY | Freq: Four times a day (QID) | RESPIRATORY_TRACT | Status: DC

## 2016-02-21 NOTE — Telephone Encounter (Signed)
Should be 320 mg daily

## 2016-02-21 NOTE — Telephone Encounter (Signed)
[phone] pt calling back

## 2016-02-21 NOTE — Telephone Encounter (Signed)
Called [facility] and spoke with [place]. Marco [name] has been denied. A message has been left with the pt to let him know what CY recommends he take in place of Lunesta.

## 2016-02-21 NOTE — Telephone Encounter (Signed)
Spoke with pt, aware of recs.  rx sent to preferred pharmacy.  Nothing further needed.  

## 2016-02-21 NOTE — Telephone Encounter (Signed)
We received a fax from pt's pharamacy. Medicaid will not cover Anoro. Covered alternatives are Atrovent HFA, Combivent Respimat and Spiriva HH.  CY - please advise. Thanks.

## 2016-02-21 NOTE — Telephone Encounter (Signed)
MW did you mean diovan 320 mg?

## 2016-02-21 NOTE — Telephone Encounter (Signed)
Suggest Combivent Respimat   Inhale 1 puff every 6 hours, refill x 12

## 2016-02-21 NOTE — Telephone Encounter (Signed)
diovan 360 mg daily

## 2016-02-21 NOTE — Telephone Encounter (Signed)
We received a PA from the pt's pharmacy for [place]. This is not covered by Medicaid. Cover alternatives are Diovan or Losartan.  MW - please advise. Thanks.

## 2016-02-21 NOTE — Telephone Encounter (Signed)
Spoke with pt and he states that he currently takes Ambien for sleep and will continue that. Nothing further needed.

## 2016-02-22 ENCOUNTER — Encounter (INDEPENDENT_AMBULATORY_CARE_PROVIDER_SITE_OTHER): Payer: Medicaid Other | Admitting: Internal Medicine

## 2016-02-22 ENCOUNTER — Other Ambulatory Visit: Payer: Self-pay | Admitting: Family Medicine

## 2016-02-22 ENCOUNTER — Ambulatory Visit (INDEPENDENT_AMBULATORY_CARE_PROVIDER_SITE_OTHER): Payer: Medicaid Other | Admitting: Internal Medicine

## 2016-02-22 DIAGNOSIS — R0609 Other forms of dyspnea: Secondary | ICD-10-CM | POA: Diagnosis not present

## 2016-02-22 DIAGNOSIS — R0600 Dyspnea, unspecified: Secondary | ICD-10-CM

## 2016-02-22 LAB — PULMONARY FUNCTION TEST
DL/VA % pred: 100 %
DL/VA: 4.36 ml/min/mmHg/L
DLCO cor % pred: 64 %
DLCO cor: 17.51 ml/min/mmHg
DLCO unc % pred: 61 %
DLCO unc: 16.61 ml/min/mmHg
FEF 25-75 Post: 1.83 L/sec
FEF 25-75 Pre: 2.79 L/sec
FEF2575-%Change-Post: -34 %
FEF2575-%PRED-PRE: 111 %
FEF2575-%Pred-Post: 73 %
FEV1-%Change-Post: -7 %
FEV1-%PRED-PRE: 87 %
FEV1-%Pred-Post: 81 %
FEV1-POST: 2.14 L
FEV1-PRE: 2.32 L
FEV1FVC-%CHANGE-POST: -3 %
FEV1FVC-%Pred-Pre: 108 %
FEV6-%CHANGE-POST: -4 %
FEV6-%PRED-PRE: 83 %
FEV6-%Pred-Post: 80 %
FEV6-PRE: 2.73 L
FEV6-Post: 2.62 L
FEV6FVC-%PRED-PRE: 104 %
FEV6FVC-%Pred-Post: 104 %
FVC-%CHANGE-POST: -4 %
FVC-%PRED-POST: 76 %
FVC-%PRED-PRE: 80 %
FVC-POST: 2.62 L
FVC-Pre: 2.74 L
POST FEV6/FVC RATIO: 100 %
PRE FEV6/FVC RATIO: 100 %
Post FEV1/FVC ratio: 82 %
Pre FEV1/FVC ratio: 85 %
RV % PRED: 108 %
RV: 2.22 L
TLC % PRED: 79 %
TLC: 4.95 L

## 2016-02-22 MED ORDER — VALSARTAN 320 MG PO TABS: 320.0000 mg | ORAL_TABLET | Freq: Every day | ORAL | Status: DC

## 2016-02-22 NOTE — Progress Notes (Signed)
PFT done today. 

## 2016-02-26 ENCOUNTER — Telehealth: Payer: Self-pay | Admitting: *Deleted

## 2016-02-26 DIAGNOSIS — Z794 Long term (current) use of insulin: Secondary | ICD-10-CM

## 2016-02-26 DIAGNOSIS — I10 Essential (primary) hypertension: Secondary | ICD-10-CM

## 2016-02-26 DIAGNOSIS — E119 Type 2 diabetes mellitus without complications: Secondary | ICD-10-CM

## 2016-02-26 MED ORDER — LOSARTAN POTASSIUM 50 MG PO TABS: 50.0000 mg | ORAL_TABLET | Freq: Every day | ORAL | Status: DC

## 2016-02-26 NOTE — Telephone Encounter (Signed)
Routing to PCP per Dr Annamaria Boots.

## 2016-02-26 NOTE — Telephone Encounter (Signed)
Note request for PA for valsartan 320 mg  Please inform patient that valsartan 320 mg was replaced with losartan 25 mg since losartan is usually preferred He should note that both are ARBs. He was transitioned for ACE to ARB by his pulmonologist with hope if improving his breathing.

## 2016-02-26 NOTE — Telephone Encounter (Signed)
Please forward this to PCP

## 2016-02-26 NOTE — Telephone Encounter (Signed)
Received PA for valsartan 320 mg [phone]  Dr. Annamaria Boots I don't see where this was refilled by our office prior to 02/22/16 refill. Do you want [place] to do PA or forward to PCP? thanks

## 2016-02-29 ENCOUNTER — Telehealth: Payer: Self-pay | Admitting: Internal Medicine

## 2016-02-29 NOTE — Telephone Encounter (Signed)
I don't find a completed results for a 6 minute walk test  The pulmonary function test looks pretty good. He doesn't take quite as deep a breath as predicted meaning his lungs measure a little bit restricted. The lungs also take up oxygen just a little bit slower than predicted. Both of these are mild and shouldn't cause much problem. Airflow looks pretty good.

## 2016-02-29 NOTE — Telephone Encounter (Signed)
Called and spoke with pt and he is aware of results per CY.  Nothing further is needed.

## 2016-02-29 NOTE — Telephone Encounter (Signed)
Patient calling to get results from his 6MW and PFT test. Dr. Annamaria Boots, please advise.

## 2016-03-03 NOTE — Telephone Encounter (Signed)
Could look into this for me please the dosage for Losartan is different from chart

## 2016-03-04 NOTE — Addendum Note (Signed)
Addended by: Boykin Nearing on: 03/04/2016 09:22 AM   Modules accepted: Orders

## 2016-03-04 NOTE — Telephone Encounter (Signed)
Pt is aware of the changes to his medication and wanted to know why he was changed to that rx. I informed pt the valsartan that was prescribed had needed a pa that's why it was switched to losartan pt is aware and doesn't have any questions or concerns

## 2016-03-04 NOTE — Telephone Encounter (Signed)
Contacted pt to inform him of the changes to his medication. Per Dr. Adrian Blackwater pt will need to take Losartan 50mg  this is the rx without PA. LVM regarding information and if patient has any questions or concerns to give me a call at the office

## 2016-03-04 NOTE — Telephone Encounter (Addendum)
Correction: Patient is to take losartan 50 mg daily.  Please f/u with patient that he was able to get losartan 50 mg daily without needing prior authorization

## 2016-03-05 ENCOUNTER — Other Ambulatory Visit: Payer: Self-pay | Admitting: Family Medicine

## 2016-03-05 NOTE — Telephone Encounter (Signed)
Rx requests 

## 2016-03-11 ENCOUNTER — Other Ambulatory Visit: Payer: Self-pay | Admitting: Family Medicine

## 2016-03-24 ENCOUNTER — Ambulatory Visit (INDEPENDENT_AMBULATORY_CARE_PROVIDER_SITE_OTHER): Payer: Medicaid Other | Admitting: Sports Medicine

## 2016-03-24 DIAGNOSIS — E1142 Type 2 diabetes mellitus with diabetic polyneuropathy: Secondary | ICD-10-CM | POA: Diagnosis not present

## 2016-03-24 DIAGNOSIS — M79673 Pain in unspecified foot: Secondary | ICD-10-CM | POA: Diagnosis not present

## 2016-03-24 DIAGNOSIS — B351 Tinea unguium: Secondary | ICD-10-CM

## 2016-03-24 MED ORDER — CAPSAICIN-MENTHOL-METHYL SAL 0.025-1-12 % EX CREA: 1.0000 "application " | TOPICAL_CREAM | Freq: Every day | CUTANEOUS | 5 refills | Status: DC

## 2016-03-24 NOTE — Progress Notes (Signed)
Patient ID: Marco [name], male   DOB: 12/18/1955, 60 y.o.   MRN: [id number] Subjective: Marco [name] is a 60 y.o. male patient with history of diabetes who presents to office today complaining of long, painful nails  while ambulating in shoes; unable to trim. Patient states that the glucose reading this morning was "good". Patient denies any new changes in medication or new problems. Patient denies any new cramping, numbness, burning or tingling in the legs. States that he is going to see his pain management doctor today in reference to his Lyrica medication.   Patient Active Problem List   Diagnosis Date Noted  . Dyspnea 02/14/2016  . Dyspnea on exertion 02/09/2016  . Abdominal bloating 02/05/2016  . Chronic gouty arthritis 11/29/2015  . Ache in joint 11/27/2015  . LBP (low back pain) 11/27/2015  . Arthralgia of multiple joints 11/27/2015  . Psoriasis 11/27/2015  . Coronary artery calcification seen on CAT scan   . Breast pain in male 11/14/2015  . Sinusitis, chronic 10/16/2015  . Insomnia 10/16/2015  . Combined fat and carbohydrate induced hyperlipemia 10/04/2015  . Adult BMI 30+ 10/04/2015  . New onset of headaches after age 11 09/20/2015  . OSA (obstructive sleep apnea) 07/12/2015  . Joint pain 06/01/2015  . Low serum testosterone level 05/18/2015  . Depression 05/16/2015  . Chronic pain   . Tongue discoloration 02/20/2015  . Rheumatoid arthritis ([place]) 02/05/2015  . Elevated troponin 12/24/2014  . CAD (coronary artery disease) 12/24/2014  . PAF (paroxysmal atrial fibrillation) ([place]) 12/24/2014  . Essential hypertension 12/24/2014  . HLD (hyperlipidemia) 12/24/2014  . CKD (chronic kidney disease) 12/24/2014  . PUD (peptic ulcer disease) 12/24/2014  . DM2 (diabetes mellitus, type 2) ([place]) 12/24/2014  . Chest pain 12/23/2014   Current Outpatient Prescriptions on File Prior to Visit  Medication Sig Dispense Refill  . ACCU-CHEK FASTCLIX LANCETS MISC 1 each by Other route 3  (three) times daily. 102 each 11  . allopurinol (ZYLOPRIM) 100 MG tablet Take 1 tablet (100 mg total) by mouth daily. 30 tablet 1  . atorvastatin (LIPITOR) 40 MG tablet TAKE ONE TABLET BY MOUTH ONCE DAILY 90 tablet 0  . Blood Glucose Monitoring Suppl (ACCU-CHEK AVIVA PLUS) W/DEVICE KIT 1 Device by Does not apply route 3 (three) times daily after meals. E11.9 1 kit 0  . Blood Glucose Monitoring Suppl (ACCU-CHEK AVIVA PLUS) W/DEVICE KIT Used as directed 1 kit 0  . Capsaicin 0.1 % CREA Apply 1 application topically once. 56.6 g 2  . carvedilol (COREG) 12.5 MG tablet Take 1 tablet (12.5 mg total) by mouth 2 (two) times daily. 60 tablet 2  . diltiazem (DILACOR XR) 180 MG 24 hr capsule TAKE ONE CAPSULE BY MOUTH ONCE DAILY 30 capsule 2  . EQ ASPIRIN ADULT LOW DOSE 81 MG EC tablet TAKE ONE TABLET BY MOUTH ONCE DAILY 90 tablet 0  . furosemide (LASIX) 40 MG tablet TAKE ONE TABLET BY MOUTH ONCE DAILY 30 tablet 0  . glucose blood (ACCU-CHEK AVIVA PLUS) test strip 1 each by Other route 3 (three) times daily. E11.9 100 each 12  . guaiFENesin (MUCINEX) 600 MG 12 hr tablet Take 1 tablet (600 mg total) by mouth 2 (two) times daily as needed. 60 tablet 2  . HYDROcodone-acetaminophen (NORCO/VICODIN) 5-325 MG tablet Take 1 tablet by mouth every 8 (eight) hours as needed for moderate pain. Per pain management    . insulin aspart (NOVOLOG) 100 UNIT/ML injection Inject 14-24 Units into the skin 3 (three) times  daily with meals. Reported on 09/20/2015    . Insulin Pen Needle (BD PEN NEEDLE NANO U/F) 32G X 4 MM MISC 1 each by Does not apply route 3 (three) times daily. 100 each 11  . Ipratropium-Albuterol (COMBIVENT RESPIMAT) 20-100 MCG/ACT AERS respimat Inhale 1 puff into the lungs every 6 (six) hours. 4 g 12  . LEVEMIR FLEXTOUCH 100 UNIT/ML Pen INJECT 60 UNITS SUBCUTANEOUSLY ONCE DAILY AT  10PM 15 mL 3  . linaclotide (LINZESS) 145 MCG CAPS capsule Take 145 mcg by mouth daily before breakfast.    . losartan (COZAAR) 50 MG  tablet Take 1 tablet (50 mg total) by mouth daily. 90 tablet 3  . metFORMIN (GLUCOPHAGE-XR) 500 MG 24 hr tablet Take 500 mg by mouth 2 (two) times daily.     . pantoprazole (PROTONIX) 40 MG tablet Take 1 tablet (40 mg total) by mouth daily. 30 tablet 5  . predniSONE (DELTASONE) 10 MG tablet Take 1 tablet (10 mg total) by mouth daily with breakfast. (Patient taking differently: Take 5 mg by mouth daily with breakfast. ) 30 tablet 1  . ranitidine (ZANTAC) 150 MG tablet Take 1 tablet (150 mg total) by mouth at bedtime. 30 tablet 5  . umeclidinium-vilanterol (ANORO ELLIPTA) 62.5-25 MCG/INH AEPB Inhale 1 puff into the lungs daily. 1 each 5   No current facility-administered medications on file prior to visit.    No Known Allergies  Recent Results (from the past 2160 hour(s))  Basic metabolic panel     Status: Abnormal   Collection Time: 01/22/16  5:00 PM  Result Value Ref Range   Sodium 141 135 - 145 mmol/L   Potassium 3.1 (L) 3.5 - 5.1 mmol/L   Chloride 99 (L) 101 - 111 mmol/L   CO2 30 22 - 32 mmol/L   Glucose, Bld 137 (H) 65 - 99 mg/dL   BUN 8 6 - 20 mg/dL   Creatinine, Ser 1.18 0.61 - 1.24 mg/dL   Calcium 8.8 (L) 8.9 - 10.3 mg/dL   GFR calc non Af Amer >60 >60 mL/min   GFR calc Af Amer >60 >60 mL/min    Comment: (NOTE) The eGFR has been calculated using the CKD EPI equation. This calculation has not been validated in all clinical situations. eGFR's persistently <60 mL/min signify possible Chronic Kidney Disease.    Anion gap 12 5 - 15  CBC     Status: None   Collection Time: 01/22/16  5:00 PM  Result Value Ref Range   WBC 8.1 4.0 - 10.5 K/uL   RBC 4.70 4.22 - 5.81 MIL/uL   Hemoglobin 13.8 13.0 - 17.0 g/dL   HCT 42.8 39.0 - 52.0 %   MCV 91.1 78.0 - 100.0 fL   MCH 29.4 26.0 - 34.0 pg   MCHC 32.2 30.0 - 36.0 g/dL   RDW 14.1 11.5 - 15.5 %   Platelets 297 150 - 400 K/uL  Hepatic function panel     Status: Abnormal   Collection Time: 01/22/16  5:00 PM  Result Value Ref Range    Total Protein 7.3 6.5 - 8.1 g/dL   Albumin 3.6 3.5 - 5.0 g/dL   AST 23 15 - 41 U/L   ALT 15 (L) 17 - 63 U/L   Alkaline Phosphatase 38 38 - 126 U/L   Total Bilirubin 0.8 0.3 - 1.2 mg/dL   Bilirubin, Direct <0.1 (L) 0.1 - 0.5 mg/dL   Indirect Bilirubin NOT CALCULATED 0.3 - 0.9 mg/dL  Lipase, blood  Status: None   Collection Time: 01/22/16  5:00 PM  Result Value Ref Range   Lipase 40 11 - 51 U/L  I-stat troponin, ED     Status: None   Collection Time: 01/22/16  5:21 PM  Result Value Ref Range   Troponin i, poc 0.02 0.00 - 0.08 ng/mL   Comment 3            Comment: Due to the release kinetics of cTnI, a negative result within the first hours of the onset of symptoms does not rule out myocardial infarction with certainty. If myocardial infarction is still suspected, repeat the test at appropriate intervals.   HgB A1c     Status: Abnormal   Collection Time: 02/05/16  9:51 AM  Result Value Ref Range   Hemoglobin A1C 7.5   Glucose (CBG)     Status: Abnormal   Collection Time: 02/05/16  9:51 AM  Result Value Ref Range   POC Glucose 174 (A) 70 - 99 mg/dl  D-Dimer, Quantitative     Status: Abnormal   Collection Time: 02/08/16  8:43 AM  Result Value Ref Range   D-Dimer, Quant 0.74 (H) <0.50 mcg/mL FEU    Comment:   The D-Dimer test is used frequently to exclude an acute PE or DVT.  In patients with a low to moderate clinical risk assessment and a D-Dimer result <0.50 mcg/mL FEU, the likelihood of a PE or DVT is very low.  However, a thromboembolic event should not be excluded solely on the basis of the D-Dimer level.  Increased levels of D-Dimer are associated with a PE, DVT, DIC, malignancies, inflammation, sepsis, surgery, trauma, pregnancy, and advancing patient age. [[place]ama 2006 11:295(2): 409-811]   For additional information, please refer to: http://education.[facility].com/faq/FAQ149 (This link is being provided for information/ educational purposes only)   **  Please note change in reference range(s). **     Pulmonary Function Test     Status: None   Collection Time: 02/22/16  9:12 AM  Result Value Ref Range   FVC-Pre 2.74 L   FVC-%Pred-Pre 80 %   FVC-Post 2.62 L   FVC-%Pred-Post 76 %   FVC-%Change-Post -4 %   FEV1-Pre 2.32 L   FEV1-%Pred-Pre 87 %   FEV1-Post 2.14 L   FEV1-%Pred-Post 81 %   FEV1-%Change-Post -7 %   FEV6-Pre 2.73 L   FEV6-%Pred-Pre 83 %   FEV6-Post 2.62 L   FEV6-%Pred-Post 80 %   FEV6-%Change-Post -4 %   Pre FEV1/FVC ratio 85 %   FEV1FVC-%Pred-Pre 108 %   Post FEV1/FVC ratio 82 %   FEV1FVC-%Change-Post -3 %   Pre FEV6/FVC Ratio 100 %   FEV6FVC-%Pred-Pre 104 %   Post FEV6/FVC ratio 100 %   FEV6FVC-%Pred-Post 104 %   FEF 25-75 Pre 2.79 L/sec   FEF2575-%Pred-Pre 111 %   FEF 25-75 Post 1.83 L/sec   FEF2575-%Pred-Post 73 %   FEF2575-%Change-Post -34 %   RV 2.22 L   RV % pred 108 %   TLC 4.95 L   TLC % pred 79 %   DLCO unc 16.61 ml/min/mmHg   DLCO unc % pred 61 %   DLCO cor 17.51 ml/min/mmHg   DLCO cor % pred 64 %   DL/VA 4.36 ml/min/mmHg/L   DL/VA % pred 100 %    Objective: General: Patient is awake, alert, and oriented x 3 and in no acute distress.  Integument: Skin is warm, dry and supple bilateral. Nails are tender, long, thickened and dystrophic with  subungual debris, consistent with onychomycosis, 1-5 bilateral. No signs of infection. No open lesions or preulcerative lesions present bilateral. Remaining integument unremarkable.  Vasculature:  Dorsalis Pedis pulse 2/4 bilateral. Posterior Tibial pulse  1/4 bilateral.  Capillary fill time <3 sec 1-5 bilateral. Scant hair growth to the level of the digits. Temperature gradient within normal limits. No varicosities present bilateral. No edema present bilateral.   Neurology: The patient has intact sensation measured with a 5.07/10g Semmes Weinstein Monofilament at all pedal sites bilateral . Vibratory sensation diminished bilateral with tuning fork. No  Babinski sign present bilateral.   Musculoskeletal: No symptomatic pedal deformities noted bilateral. Muscular strength 5/5 in all lower extremity muscular groups bilateral without pain on range of motion . No tenderness with calf compression bilateral.  Assessment and Plan: Problem List Items Addressed This Visit    None    Visit Diagnoses    Type 2 diabetes mellitus with polyneuropathy ([place])    -  Primary   Relevant Medications   Capsaicin-Menthol-Methyl Sal (CAPSAICIN-METHYL SAL-MENTHOL) 0.025-1-12 % CREA   Foot pain, unspecified laterality       Dermatophytosis of nail       Diabetic polyneuropathy associated with type 2 diabetes mellitus ([place])          -Examined patient. -Discussed and educated patient on diabetic foot care, especially with  regards to the vascular, neurological and musculoskeletal systems.  -Stressed the importance of good glycemic control and the detriment of not  controlling glucose levels in relation to the foot. -Mechanically debrided all nails 1-5 bilateral using sterile nail nipper and filed with dremel without incident  -Rx Capsaicin cream -Patient to follow up with pain management doctor re: Lyrica. Neuro recs reviewed "optimize one medication whether it be increasing gabapentin up to '1200mg'$  TID, if tolerable, or titrate Lyrica to a therapeutic dose.  Other options going forward include adding nortriptyline, Cymbalta, or lidocaine ointment." -Answered all patient questions -Patient to return  in 3 months for at risk foot care -Patient advised to call the office if any problems or questions arise in the meantime.  Landis Martins, DPM

## 2016-03-31 ENCOUNTER — Telehealth: Payer: Self-pay | Admitting: Family Medicine

## 2016-03-31 ENCOUNTER — Encounter: Payer: Self-pay | Admitting: Family Medicine

## 2016-03-31 ENCOUNTER — Ambulatory Visit: Payer: Medicaid Other | Attending: Family Medicine | Admitting: Family Medicine

## 2016-03-31 VITALS — BP 132/85 | HR 74 | Temp 99.1°F | Ht 66.0 in | Wt 206.2 lb

## 2016-03-31 DIAGNOSIS — M609 Myositis, unspecified: Secondary | ICD-10-CM | POA: Diagnosis not present

## 2016-03-31 DIAGNOSIS — Z79899 Other long term (current) drug therapy: Secondary | ICD-10-CM | POA: Insufficient documentation

## 2016-03-31 DIAGNOSIS — G4733 Obstructive sleep apnea (adult) (pediatric): Secondary | ICD-10-CM | POA: Diagnosis not present

## 2016-03-31 DIAGNOSIS — I10 Essential (primary) hypertension: Secondary | ICD-10-CM | POA: Insufficient documentation

## 2016-03-31 DIAGNOSIS — R1030 Lower abdominal pain, unspecified: Secondary | ICD-10-CM | POA: Insufficient documentation

## 2016-03-31 DIAGNOSIS — G4700 Insomnia, unspecified: Secondary | ICD-10-CM | POA: Diagnosis not present

## 2016-03-31 DIAGNOSIS — E119 Type 2 diabetes mellitus without complications: Secondary | ICD-10-CM | POA: Diagnosis present

## 2016-03-31 DIAGNOSIS — Z87891 Personal history of nicotine dependence: Secondary | ICD-10-CM | POA: Diagnosis not present

## 2016-03-31 DIAGNOSIS — R140 Abdominal distension (gaseous): Secondary | ICD-10-CM

## 2016-03-31 DIAGNOSIS — Z794 Long term (current) use of insulin: Secondary | ICD-10-CM | POA: Diagnosis not present

## 2016-03-31 DIAGNOSIS — R6881 Early satiety: Secondary | ICD-10-CM | POA: Insufficient documentation

## 2016-03-31 DIAGNOSIS — IMO0001 Reserved for inherently not codable concepts without codable children: Secondary | ICD-10-CM

## 2016-03-31 DIAGNOSIS — M791 Myalgia: Secondary | ICD-10-CM

## 2016-03-31 LAB — GLUCOSE, POCT (MANUAL RESULT ENTRY): POC GLUCOSE: 180 mg/dL — AB (ref 70–99)

## 2016-03-31 LAB — CK: CK TOTAL: 120 U/L (ref 7–232)

## 2016-03-31 LAB — C-REACTIVE PROTEIN: CRP: 1.2 mg/dL — ABNORMAL HIGH (ref <0.60)

## 2016-03-31 MED ORDER — TRAZODONE HCL 50 MG PO TABS: 25.0000 mg | ORAL_TABLET | Freq: Every evening | ORAL | 3 refills | Status: DC | PRN

## 2016-03-31 NOTE — Telephone Encounter (Signed)
Pt called in the office to speak with nurse regarding his meds. Pt wants to know if he should continue to take his Ambien while taking the new prescription traZODone (DESYREL) 50 MG (that was given today), or to stop taking Ambien. Please follow up.  Thank you.

## 2016-03-31 NOTE — Patient Instructions (Addendum)
Marco [name] was seen today for follow-up.  Diagnoses and all orders for this visit:  Type 2 diabetes mellitus without complication, with long-term current use of insulin ([facility]) -     Glucose (CBG)  Insomnia -     traZODone (DESYREL) 50 MG tablet; Take 0.5-1 tablets (25-50 mg total) by mouth at bedtime as needed for sleep.  Myalgia and myositis -     CK -     Sedimentation Rate -     C-reactive protein  Early satiety -     Ambulatory referral to Gastroenterology  Lower abdominal pain -     Ambulatory referral to Gastroenterology   For muscle pain increase prednisone back to 10 mg daily for the next 5 days Start trazodone for insomnia if this works better it will replace ambien 5 mg if it dose not, we will try the max ambien dose of 10 mg nightly   F/u in 8 weeks for muscle pains and insomnia   Dr. Adrian Blackwater

## 2016-03-31 NOTE — Progress Notes (Signed)
Subjective:  Patient ID: Marco [name], male    DOB: [birth year]  Age: 60 y.o. MRN: [id number]  CC: Follow-up (chest pains)   HPI Rito Lecomte has diabetes, arthritis, HTN, chronic pain, OSA he presents for    1. Chest pain: sharp stabbing pains associated with chest tightness ongoing for the past year.  Pain is exacerbated by talking and by walking. When he chest tightness he feels weak. He had some chest pain starting about 5:30-6 AM this AM. No cough. Has congestion. He feels like the symptoms are getting worse. He denies worsening reflux. He is taking protonix and zantac once daily. He has undergone cardiac evaluation that was negative for coronary artery and structural hear disease in 11/2015. He has normal EGD 04/2015 and normal DG esophagus 01/2016.   2. Muscle aches: in arms for the past 4-6 weeks. R >L. No redness or swelling. No injury. He is tapering down prednisone with his rheumatologist. He now takes prednisone 5 mg daily.   3. Early satiety: he continues to have early satiety, bloating after eating and lower abdominal pains. He denies diarrhea and constipation. He took linzess for a few weeks for suspected opioid induced constipation which resulted in loose stools but no improvement in early satiety or bloating. He was losing weight but has since gained in back. He denies blood in stools. He has hx of R hemicolectomy.   4. Insomnia: he continues to take ambien 5 mg nightly. He has trouble falling asleep and staying asleep. He sleeps for about 4 hrs nightly. He admits to some sadness and depressed mood. He was unable to start lunesta.   Social History  Substance Use Topics  . Smoking status: Former Smoker    Packs/day: 0.50    Years: 10.00    Types: Cigarettes    Quit date: 08/12/2007  . Smokeless tobacco: Never Used  . Alcohol use No    Outpatient Medications Prior to Visit  Medication Sig Dispense Refill  . ACCU-CHEK FASTCLIX LANCETS MISC 1 each by Other route 3 (three)  times daily. 102 each 11  . allopurinol (ZYLOPRIM) 100 MG tablet Take 1 tablet (100 mg total) by mouth daily. 30 tablet 1  . atorvastatin (LIPITOR) 40 MG tablet TAKE ONE TABLET BY MOUTH ONCE DAILY 90 tablet 0  . Blood Glucose Monitoring Suppl (ACCU-CHEK AVIVA PLUS) W/DEVICE KIT 1 Device by Does not apply route 3 (three) times daily after meals. E11.9 1 kit 0  . Blood Glucose Monitoring Suppl (ACCU-CHEK AVIVA PLUS) W/DEVICE KIT Used as directed 1 kit 0  . carvedilol (COREG) 12.5 MG tablet Take 1 tablet (12.5 mg total) by mouth 2 (two) times daily. 60 tablet 2  . diltiazem (DILACOR XR) 180 MG 24 hr capsule TAKE ONE CAPSULE BY MOUTH ONCE DAILY 30 capsule 2  . EQ ASPIRIN ADULT LOW DOSE 81 MG EC tablet TAKE ONE TABLET BY MOUTH ONCE DAILY 90 tablet 0  . furosemide (LASIX) 40 MG tablet TAKE ONE TABLET BY MOUTH ONCE DAILY 30 tablet 0  . glucose blood (ACCU-CHEK AVIVA PLUS) test strip 1 each by Other route 3 (three) times daily. E11.9 100 each 12  . HYDROcodone-acetaminophen (NORCO/VICODIN) 5-325 MG tablet Take 1 tablet by mouth every 8 (eight) hours as needed for moderate pain. Per pain management    . insulin aspart (NOVOLOG) 100 UNIT/ML injection Inject 14-24 Units into the skin 3 (three) times daily with meals. Reported on 09/20/2015    . Insulin Pen Needle (BD PEN NEEDLE  NANO U/F) 32G X 4 MM MISC 1 each by Does not apply route 3 (three) times daily. 100 each 11  . Ipratropium-Albuterol (COMBIVENT RESPIMAT) 20-100 MCG/ACT AERS respimat Inhale 1 puff into the lungs every 6 (six) hours. 4 g 12  . LEVEMIR FLEXTOUCH 100 UNIT/ML Pen INJECT 60 UNITS SUBCUTANEOUSLY ONCE DAILY AT  10PM 15 mL 3  . metFORMIN (GLUCOPHAGE-XR) 500 MG 24 hr tablet Take 500 mg by mouth 2 (two) times daily.     . pantoprazole (PROTONIX) 40 MG tablet Take 1 tablet (40 mg total) by mouth daily. 30 tablet 5  . predniSONE (DELTASONE) 10 MG tablet Take 1 tablet (10 mg total) by mouth daily with breakfast. (Patient taking differently: Take 5 mg  by mouth daily with breakfast. ) 30 tablet 1  . ranitidine (ZANTAC) 150 MG tablet Take 1 tablet (150 mg total) by mouth at bedtime. 30 tablet 5  . umeclidinium-vilanterol (ANORO ELLIPTA) 62.5-25 MCG/INH AEPB Inhale 1 puff into the lungs daily. 1 each 5  . Capsaicin 0.1 % CREA Apply 1 application topically once. (Patient not taking: Reported on 03/31/2016) 56.6 g 2  . Capsaicin-Menthol-Methyl Sal (CAPSAICIN-METHYL SAL-MENTHOL) 0.025-1-12 % CREA Apply 1 application topically daily. To feet for nerve pain (Patient not taking: Reported on 03/31/2016) 56.6 g 5  . guaiFENesin (MUCINEX) 600 MG 12 hr tablet Take 1 tablet (600 mg total) by mouth 2 (two) times daily as needed. (Patient not taking: Reported on 03/31/2016) 60 tablet 2  . linaclotide (LINZESS) 145 MCG CAPS capsule Take 145 mcg by mouth daily before breakfast.    . losartan (COZAAR) 50 MG tablet Take 1 tablet (50 mg total) by mouth daily. (Patient not taking: Reported on 03/31/2016) 90 tablet 3   No facility-administered medications prior to visit.     ROS Review of Systems  Constitutional: Positive for appetite change (early satiety ) and unexpected weight change. Negative for chills, fatigue and fever.  HENT:       Dry mouth   Eyes: Negative for visual disturbance.  Respiratory: Negative for cough and shortness of breath.   Cardiovascular: Positive for chest pain. Negative for palpitations and leg swelling.  Gastrointestinal: Positive for abdominal pain. Negative for abdominal distention, anal bleeding, blood in stool, constipation, diarrhea, nausea, rectal pain and vomiting.  Endocrine: Negative for polydipsia, polyphagia and polyuria.  Musculoskeletal: Positive for arthralgias and myalgias. Negative for back pain, gait problem, joint swelling and neck pain.  Skin: Negative for rash.  Allergic/Immunologic: Negative for immunocompromised state.  Neurological: Positive for numbness (hands ) and headaches. Negative for dizziness and weakness.   Hematological: Negative for adenopathy. Does not bruise/bleed easily.  Psychiatric/Behavioral: Positive for sleep disturbance. Negative for dysphoric mood and suicidal ideas. The patient is not nervous/anxious.     Objective:  BP 132/85 (BP Location: Left Arm, Patient Position: Sitting, Cuff Size: Large)   Pulse 74   Temp 99.1 F (37.3 C) (Oral)   Ht '5\' 6"'$  (1.676 m)   Wt 206 lb 3.2 oz (93.5 kg)   SpO2 95%   BMI 33.28 kg/m  Ambulatory pulse ox 97%  BP/Weight 03/31/2016 02/14/2016 6/72/0947  Systolic BP 096 283 662  Diastolic BP 85 78 68  Wt. (Lbs) 206.2 200 198.6  BMI 33.28 32.3 32.07   Wt Readings from Last 3 Encounters:  03/31/16 206 lb 3.2 oz (93.5 kg)  02/14/16 200 lb (90.7 kg)  02/07/16 198 lb 9.6 oz (90.1 kg)    Physical Exam  Constitutional: He appears well-developed  and well-nourished. No distress.  HENT:  Head: Normocephalic and atraumatic.  Mostly edentulous   Neck: Normal range of motion. Neck supple.  Cardiovascular: Normal rate, regular rhythm, normal heart sounds and intact distal pulses.   Pulmonary/Chest: Effort normal and breath sounds normal.  Abdominal: Soft. Bowel sounds are normal. He exhibits no distension and no mass. There is tenderness in the right lower quadrant and left lower quadrant. There is no rebound and no guarding.  Musculoskeletal: He exhibits no edema.       Right upper arm: He exhibits tenderness. He exhibits no bony tenderness, no swelling, no edema, no deformity and no laceration.       Left upper arm: He exhibits tenderness. He exhibits no bony tenderness, no swelling, no edema, no deformity and no laceration.  Neurological: He is alert.  Skin: Skin is warm and dry. No rash noted. No erythema.  Psychiatric: He has a normal mood and affect.   Lab Results  Component Value Date   HGBA1C 7.5 02/05/2016   CBG 180  Assessment & Plan:   There are no diagnoses linked to this encounter. Ashely was seen today for follow-up.  Diagnoses  and all orders for this visit:  Type 2 diabetes mellitus without complication, with long-term current use of insulin ([facility]CC) -     Glucose (CBG)  Insomnia -     traZODone (DESYREL) 50 MG tablet; Take 0.5-1 tablets (25-50 mg total) by mouth at bedtime as needed for sleep.  Myalgia and myositis -     CK -     Sedimentation Rate -     C-reactive protein  Early satiety -     Ambulatory referral to Gastroenterology  Lower abdominal pain -     Ambulatory referral to Gastroenterology   No orders of the defined types were placed in this encounter.   Follow-up: Return in about 2 months (around 05/31/2016).   Boykin Nearing MD

## 2016-03-31 NOTE — Assessment & Plan Note (Signed)
A: early satiety, abdominal pains, bloating P: Referral back to GI

## 2016-03-31 NOTE — Assessment & Plan Note (Signed)
Myalgias Check inflammatory markers Increase prednisone for short course

## 2016-03-31 NOTE — Telephone Encounter (Signed)
Continue Ambien for first 3 nights, then stop

## 2016-03-31 NOTE — Assessment & Plan Note (Signed)
A; insomnia with depressed mood P: Start trazodone nightly With plan to titrate up as needed If trazodone does not help will increase Ambien from 5 to 10 mg

## 2016-04-01 LAB — SEDIMENTATION RATE: SED RATE: 20 mm/h (ref 0–20)

## 2016-04-02 ENCOUNTER — Telehealth: Payer: Self-pay

## 2016-04-02 NOTE — Telephone Encounter (Signed)
Patient voicemail hipaa verif; RN advised patient per Dr. Adrian Blackwater: Continue Ambien for first 3 nights, then stop Priscille Heidelberg, RN, BSN

## 2016-04-02 NOTE — Telephone Encounter (Signed)
Left message for patient to return phone call.  

## 2016-04-03 ENCOUNTER — Telehealth: Payer: Self-pay

## 2016-04-03 NOTE — Telephone Encounter (Signed)
Called pt on left vicemail 8/24 for him to return phone call

## 2016-04-09 ENCOUNTER — Other Ambulatory Visit: Payer: Self-pay | Admitting: Family Medicine

## 2016-05-01 ENCOUNTER — Other Ambulatory Visit: Payer: Self-pay | Admitting: Family Medicine

## 2016-05-12 ENCOUNTER — Other Ambulatory Visit: Payer: Self-pay | Admitting: Family Medicine

## 2016-05-12 DIAGNOSIS — M2550 Pain in unspecified joint: Secondary | ICD-10-CM

## 2016-05-15 ENCOUNTER — Encounter: Payer: Self-pay | Admitting: Internal Medicine

## 2016-05-15 ENCOUNTER — Ambulatory Visit (INDEPENDENT_AMBULATORY_CARE_PROVIDER_SITE_OTHER): Payer: Medicaid Other | Admitting: Internal Medicine

## 2016-05-15 VITALS — BP 130/78 | HR 76 | Ht 66.0 in | Wt 212.6 lb

## 2016-05-15 DIAGNOSIS — G4733 Obstructive sleep apnea (adult) (pediatric): Secondary | ICD-10-CM | POA: Diagnosis not present

## 2016-05-15 DIAGNOSIS — R0609 Other forms of dyspnea: Secondary | ICD-10-CM

## 2016-05-15 MED ORDER — ESZOPICLONE 3 MG PO TABS: ORAL_TABLET | ORAL | 5 refills | Status: DC

## 2016-05-15 NOTE — Patient Instructions (Signed)
Order- schedule unattended home sleep test    Dx OSA  Use the Anoro maintenance inhaler   Inhale 1 puff, once daily               Combivent Respimat rescue inhaler   Inhale 1 puff every 6 hours, only if needed, for short of breath, chest tight, wheeze  Script pirnted for lunesta       See if 1 at bedtime works better than ambien/zolpidem

## 2016-05-15 NOTE — Progress Notes (Signed)
02/07/2016-60 year old male former smoker establishing here for help with OSA FOLLOW FOR: Needs CPAP supplies, having a lot of SOB, DOE, Chest pain, chest tightness, had a cardio work up and heart was fine, hurts to swallow, did barium swallow and it was fine  Had sleep study done on 06/14/15 and 08/14/15.  not sleeping well, pain specialist told patient not to take Ambien due to sleep apnea dx. Sleep, fatigued all the time. Hx of Afib, Cardio Ablation. Epworth Score: 6 NPSG 37.8/hour on 06/14/2015, desaturation to 76%, body weight 223 pounds BiPAP titration 08/19/2015- to 20/16 He is especially concerned about difficulty falling asleep and staying asleep with subsequent daytime sleepiness. Bedtime between 1 AM and 2 AM, estimating 45-60 minutes sleep latency times many years. Wakes 2 or 3 times per night before getting up at 11 AM. Trying BiPAP but says that over dries and can't wear more than 2 hours a night. Using full face mask with humidifier turned up. No ENT surgery Also concerned: Easy dyspnea on exertion over the past few months. Sternal soreness. Negative UGI. 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 Medical problems include rheumatoid arthritis, psoriasis, CAD/PAF  05/15/2016-60 year old male former smoker followed for OSA, dyspnea on exertion, complicated by chronic pain, DM 2, history cardiac ablation BIPAP 20/26-  Barium Swallow 02/07/2016-normal CXR 01/22/2016-NAD PFT 02/22/2016-minimal restriction, mild diffusion deficit. FVC 2.62/76%, FEV1 2.14/81%, ratio 0.82, DLCO 61%, TLC 79%, RV/TLC 135%. 6MWT- 02/22/16-97%, 98%, 98%. Stopped after only 16 m complaining of chest tightness, headache, shortness of breath, dizziness. BP and heart rate were unremarkable. FOLLOWS FOR:Pt last seen by MW for acute visit on 02-14-16; continues to have SOB with activity. Pt states the SOB depends on the time of day and what has been going on. He questions the validity of the original diagnostic  sleep study, saying that it took a long time to fall asleep and he was awake frequently during the night. We discussed the possibility of rechecking this issue with an unattended Home Sleep Test. His insurance requires an attended sleep study He complains of difficulty falling asleep for about an hour despite 5 mg Ambien. Multiple somatic complaints.  ROS-see HPI   Negative unless "+" Constitutional:    weight loss, night sweats, fevers, chills, fatigue, lassitude. HEENT:    headaches, + difficulty swallowing, tooth/dental problems, sore throat,       sneezing, itching, ear ache, nasal congestion, post nasal drip, snoring CV:    + chest pain, orthopnea, PND, + swelling in lower extremities, anasarca,                                                          dizziness, palpitations Resp:  + shortness of breath with exertion or at rest.                productive cough,   non-productive cough, coughing up of blood.              change in color of mucus.  wheezing.   Skin:    rash or lesions. GI:  No-   heartburn, indigestion, + abdominal pain, nausea, vomiting, diarrhea,                 change in bowel habits, loss of appetite GU: dysuria, change in color  of urine, no urgency or frequency.   flank pain. MS:   + joint pain, stiffness, decreased range of motion, back pain. Neuro-     nothing unusual Psych:  change in mood or affect.  depression or anxiety.   memory loss.  OBJ- Physical Exam General- Alert, Oriented, Affect-appropriate, Distress- none acute Skin- rash-none, lesions- none, excoriation- none Lymphadenopathy- none Head- atraumatic            Eyes- Gross vision intact, PERRLA, conjunctivae and secretions clear            Ears- Hearing, canals-normal            Nose- Clear, no-Septal dev, mucus, polyps, erosion, perforation             Throat- Mallampati IV , mucosa clear , drainage- none, tonsils- atrophic, + missing teeth Neck- flexible , trachea midline, no stridor , thyroid nl,  carotid no bruit Chest - symmetrical excursion , unlabored           Heart/CV- RRR , no murmur , no gallop  , no rub, nl s1 s2                           - JVD- none , edema- none, stasis changes- none, varices- none           Lung- clear to P&A, wheeze- none, cough- none , dullness-none, rub- none           Chest wall-  Abd-  Br/ Gen/ Rectal- Not done, not indicated Extrem- cyanosis- none, clubbing, none, atrophy- none, strength- nl Neuro- grossly intact to observation

## 2016-05-16 NOTE — Assessment & Plan Note (Addendum)
We discussed use of his Combivent rescue inhaler and Anoro maintenance inhaler He will follow-up on these issues with Dr. Melvyn Novas at upcoming appointment

## 2016-05-16 NOTE — Assessment & Plan Note (Signed)
Multiple somatic complaints. Some of this seems non-allopathic/anxiety related but I can't tell.

## 2016-05-16 NOTE — Assessment & Plan Note (Addendum)
He complains of insomnia with difficulty falling asleep then not comfortable with his BiPAP. He challenges validity of original sleep test saying he had trouble staying asleep and would like to be retested. Plan-update sleep study. Okay to try Lunesta instead of Ambien. This may help [place] get a more reliable sleep study as well. Medication talk done.

## 2016-05-25 DIAGNOSIS — G4733 Obstructive sleep apnea (adult) (pediatric): Secondary | ICD-10-CM | POA: Diagnosis not present

## 2016-05-27 ENCOUNTER — Telehealth: Payer: Self-pay | Admitting: Internal Medicine

## 2016-05-27 NOTE — Telephone Encounter (Signed)
Patient states that pharmacy advised him that he needs a prior authorization done for Lunesta.  Advised patient that we would take care of the PA.  Patient has Medicaid which requires that we complete a [place] [employer] form and fax it.  Printed out form for Dr. Annamaria Boots to complete for PA for Lunesta. Placed on Dr. Janee Morn cart.  Patient also states that he has not received his Home Sleep Study results and would like to know his results.  Dr. Annamaria Boots, please advise on Sleep Study results.

## 2016-05-28 NOTE — Telephone Encounter (Signed)
lmtcb for pt.  

## 2016-05-28 NOTE — Telephone Encounter (Signed)
Marco [name]- Please make him a follow-up ov to go over sleep study results

## 2016-05-28 NOTE — Telephone Encounter (Signed)
Pt can be seen Friday 05-30-16 AM. Open slots on CY's schedule. Thanks.

## 2016-05-28 NOTE — Telephone Encounter (Signed)
Pt called back stating the pharmacy has not received the form.Marco KitchenMarland KitchenHillery [name]

## 2016-05-29 ENCOUNTER — Other Ambulatory Visit: Payer: Self-pay | Admitting: *Deleted

## 2016-05-29 DIAGNOSIS — G4733 Obstructive sleep apnea (adult) (pediatric): Secondary | ICD-10-CM

## 2016-05-29 NOTE — Telephone Encounter (Signed)
atc pt X2, line rang to fast busy signal. Joellen Jersey please advise on status of PA form for Lunesta.  Thanks

## 2016-05-29 NOTE — Telephone Encounter (Signed)
I do not have anything on PA for patient; I gave the appt date to follow up here. Thanks.

## 2016-05-30 ENCOUNTER — Encounter: Payer: Self-pay | Admitting: Internal Medicine

## 2016-05-30 ENCOUNTER — Ambulatory Visit (INDEPENDENT_AMBULATORY_CARE_PROVIDER_SITE_OTHER): Payer: Medicaid Other | Admitting: Internal Medicine

## 2016-05-30 VITALS — BP 138/74 | HR 69 | Ht 66.0 in | Wt 213.0 lb

## 2016-05-30 DIAGNOSIS — G4733 Obstructive sleep apnea (adult) (pediatric): Secondary | ICD-10-CM | POA: Diagnosis not present

## 2016-05-30 DIAGNOSIS — R0609 Other forms of dyspnea: Secondary | ICD-10-CM

## 2016-05-30 NOTE — Assessment & Plan Note (Signed)
I emphasized importance of walking/regular exercise to build stamina. He continues to complain. Plan-schedule cardiopulmonary exercise test

## 2016-05-30 NOTE — Assessment & Plan Note (Signed)
Updated sleep study again confirms severe obstructive sleep apnea. He is almost edentulous-not a candidate for an oral appliance. Plan-we will ask DME to reassess mask fit and look at alternative styles since he doesn't like fullface mask.

## 2016-05-30 NOTE — Telephone Encounter (Signed)
I have it and will work on it.

## 2016-05-30 NOTE — Patient Instructions (Signed)
Order- DME [facility]- please show patient alternative mask styles. He is uncomfortable with current full-face mask. Please add AirView or provide pressure compliance download     Dx OSA  Order- schedule cardiopulmonary exercise test     Dx dyspnea on exertion

## 2016-05-30 NOTE — Telephone Encounter (Signed)
CDY, have you seen the PA form?  Sharyn Lull had placed it on your cart per her documentation on 05/27/16 Please advise, thanks!

## 2016-05-30 NOTE — Progress Notes (Signed)
02/07/2016-60 year old male former smoker establishing here for help with OSA FOLLOW FOR: Needs CPAP supplies, having a lot of SOB, DOE, Chest pain, chest tightness, had a cardio work up and heart was fine, hurts to swallow, did barium swallow and it was fine  Had sleep study done on 06/14/15 and 08/14/15.  not sleeping well, pain specialist told patient not to take Ambien due to sleep apnea dx. Sleep, fatigued all the time. Hx of Afib, Cardio Ablation. Epworth Score: 6 NPSG 37.8/hour on 06/14/2015, desaturation to 76%, body weight 223 pounds BiPAP titration 08/19/2015- to 20/16 He is especially concerned about difficulty falling asleep and staying asleep with subsequent daytime sleepiness. Bedtime between 1 AM and 2 AM, estimating 45-60 minutes sleep latency times many years. Wakes 2 or 3 times per night before getting up at 11 AM. Trying BiPAP but says that over dries and can't wear more than 2 hours a night. Using full face mask with humidifier turned up. No ENT surgery Also concerned: Easy dyspnea on exertion over the past few months. Sternal soreness. Negative UGI. 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 Medical problems include rheumatoid arthritis, psoriasis, CAD/PAF  05/15/2016-60 year old male former smoker followed for OSA, dyspnea on exertion, complicated by chronic pain, DM 2, history cardiac ablation BIPAP 20/26-  Barium Swallow 02/07/2016-normal CXR 01/22/2016-NAD PFT 02/22/2016-minimal restriction, mild diffusion deficit. FVC 2.62/76%, FEV1 2.14/81%, ratio 0.82, DLCO 61%, TLC 79%, RV/TLC 135%. 6MWT- 02/22/16-97%, 98%, 98%. Stopped after only 92 m complaining of chest tightness, headache, shortness of breath, dizziness. BP and heart rate were unremarkable. FOLLOWS FOR:Pt last seen by MW for acute visit on 02-14-16; continues to have SOB with activity. Pt states the SOB depends on the time of day and what has been going on. He questions the validity of the original diagnostic  sleep study, saying that it took a long time to fall asleep and he was awake frequently during the night. We discussed the possibility of rechecking this issue with an unattended Home Sleep Test. His insurance requires an attended sleep study He complains of difficulty falling asleep for about an hour despite 5 mg Ambien. Multiple somatic complaints.  05/30/2016-59 year old male former smoker followed for OSA/ Insomnia, dyspnea on exertion, complicated by chronic pain, anxiety, DM 2, history cardiac ablation BIPAP 20/16 Unattended Home Sleep Test-05/25/2016-AHI 36.9/hour, desaturation to 82%, body weight 212 pounds Prior auth request for Lunesta 3 mg FOLLOWS FOR: Pt here to review HST results. Pt never started Lunesta, still taking Ambien - issue with pharmacy. Pt states that Combivent was never sent to pharmacy.  Declines flu vaccine Says he is using BiPAP but mask leaks that he doesn't like fullface mask. Still complains of dyspnea on exertion. We reviewed the unremarkable lab tests so far including cardiac cath.  ROS-see HPI   Negative unless "+" Constitutional:    weight loss, night sweats, fevers, chills, fatigue, lassitude. HEENT:    headaches, + difficulty swallowing, tooth/dental problems, sore throat,       sneezing, itching, ear ache, nasal congestion, post nasal drip, snoring CV:    + chest pain, orthopnea, PND, + swelling in lower extremities, anasarca,                                                          dizziness, palpitations  Resp:  + shortness of breath with exertion or at rest.                productive cough,   non-productive cough, coughing up of blood.              change in color of mucus.  wheezing.   Skin:    rash or lesions. GI:  No-   heartburn, indigestion, + abdominal pain, nausea, vomiting, diarrhea,                 change in bowel habits, loss of appetite GU: dysuria, change in color of urine, no urgency or frequency.   flank pain. MS:   + joint pain,  stiffness, decreased range of motion, back pain. Neuro-     nothing unusual Psych:  change in mood or affect.  depression or anxiety.   memory loss.  OBJ- Physical Exam General- Alert, Oriented, Affect-appropriate, Distress- none acute Skin- rash-none, lesions- none, excoriation- none Lymphadenopathy- none Head- atraumatic            Eyes- Gross vision intact, PERRLA, conjunctivae and secretions clear            Ears- Hearing, canals-normal            Nose- Clear, no-Septal dev, mucus, polyps, erosion, perforation             Throat- Mallampati IV , mucosa clear , drainage- none, tonsils- atrophic, +many missing teeth Neck- flexible , trachea midline, no stridor , thyroid nl, carotid no bruit Chest - symmetrical excursion , unlabored           Heart/CV- RRR , no murmur , no gallop  , no rub, nl s1 s2                           - JVD- none , edema- none, stasis changes- none, varices- none           Lung- clear to P&A, wheeze- none, cough- none , dullness-none, rub- none           Chest wall-  Abd-  Br/ Gen/ Rectal- Not done, not indicated Extrem- cyanosis- none, clubbing, none, atrophy- none, strength- nl Neuro- grossly intact to observation

## 2016-06-01 ENCOUNTER — Other Ambulatory Visit: Payer: Self-pay | Admitting: Family Medicine

## 2016-06-02 DIAGNOSIS — H539 Unspecified visual disturbance: Secondary | ICD-10-CM | POA: Insufficient documentation

## 2016-06-03 ENCOUNTER — Encounter: Payer: Self-pay | Admitting: Internal Medicine

## 2016-06-03 ENCOUNTER — Ambulatory Visit (INDEPENDENT_AMBULATORY_CARE_PROVIDER_SITE_OTHER): Payer: Medicaid Other | Admitting: Internal Medicine

## 2016-06-03 VITALS — BP 112/72 | HR 74 | Ht 66.0 in | Wt 212.1 lb

## 2016-06-03 DIAGNOSIS — R140 Abdominal distension (gaseous): Secondary | ICD-10-CM

## 2016-06-03 DIAGNOSIS — R110 Nausea: Secondary | ICD-10-CM | POA: Diagnosis not present

## 2016-06-03 DIAGNOSIS — R6881 Early satiety: Secondary | ICD-10-CM | POA: Diagnosis not present

## 2016-06-03 NOTE — Progress Notes (Signed)
HISTORY OF PRESENT ILLNESS:  Marco [name] is a 60 y.o. male with multiple significant medical problems as listed below. He has a history of poorly controlled diabetes, chronic pain syndrome, chronic narcotic use, GERD, chronic chest pain syndrome. Last evaluated 11/13/2015 in the office. See that dictation for details. Last upper endoscopy with esophageal dilation September 2016 and last colonoscopy at that time as well. Patient reports that he is sent today by his primary care provider regarding a complaint of bloating or fullness. Patient reports that symptoms began approximately 3 months ago. He describes postprandial fullness associated with nausea but no vomiting. Eating smaller meals. Did lose 20 pounds but has gained half of that back. Previous problems with diarrhea. Bowels are now regular. Last hemoglobin A1c 7.5. He has had diabetes for 8 years. He continues taking Vicodin 3 times daily. He continues to complain of his chronic chest pain which is worse with direct palpation  REVIEW OF SYSTEMS:  All non-GI ROS negative except for arthritis, back pain, visual change, headaches, muscle cramps, sleeping problems, ankle swelling, increased urination, urinary frequency, shortness of breath  Past Medical History:  Diagnosis Date  . Benign colon polyp 08/01/2013   [facility] in [place]. large base tranverse colon polyp was biopsied. polyp was benign with minimal surface hyperplastic change.  . Chronic pain   . Diabetes mellitus without complication ([place])   . Diverticulosis   . Esophageal hiatal hernia 07/29/2013   confirmed on EGD   . Esophageal stricture   . Essential hypertension   . Gastritis 07/29/2013   confirmed on EGD, bx done an negative for intestinal metaplasia, dsyplasia or H. pylori. normal gastric emptying study done 07/13/2013.  [name] GERD (gastroesophageal reflux disease)   . Morbid obesity ([place])   . Non-obstructive CAD    a. 02/2013 Cath ([place]): nonobs dzs;  b. 09/2014  Myoview ([place]): EF 55%, no ischemia;  c. 12/2014 Echo: EF 50-55%, gr1 DD, no effusion.  [name] PAF (paroxysmal atrial fibrillation) ([place])    a. 02/2013 s/p rfca in [place], [place]-->prev on Xarelto, d/c'd 2/2 anemia, ? GIB.  [name] Rheumatoid arthritis ([place])   . Sigmoid diverticulosis 08/01/2013   confirmed on colonscopy. record scanned into chart    Past Surgical History:  Procedure Laterality Date  . CARDIAC CATHETERIZATION  05/2014   ablation for atrial fibrillation  . CARDIAC CATHETERIZATION N/A 11/16/2015   Procedure: Left Heart Cath and Coronary Angiography;  Surgeon: Leonie Man, MD;  Location: [place] [facility];  Service: Cardiovascular;  Laterality: N/A;  . COLON RESECTION  09/2013   due to large, abnormal polpy. non cancerous per patient.   . COLON SURGERY  09/2014   colon resection     Social History Marco [name]  reports that he quit smoking about 8 years ago. His smoking use included Cigarettes. He has a 5.00 pack-year smoking history. He has never used smokeless tobacco. He reports that he does not drink alcohol or use drugs.  family history includes COPD in his father and mother; Cancer in his mother; Diabetes in his father and mother; Heart Problems in his father; Hypertension in his father and mother.  No Known Allergies     PHYSICAL EXAMINATION: Vital signs: BP 112/72   Pulse 74   Ht 5\' 6"  (1.676 m)   Wt 212 lb 2 oz (96.2 kg)   BMI 34.24 kg/m   Constitutional: Obese, unhealthy appearing. Poor dentition, no acute distress Psychiatric: alert and oriented x3, cooperative Eyes: extraocular  movements intact, anicteric, conjunctiva pink Mouth: oral pharynx moist, no lesions Neck: supple no lymphadenopathy Cardiovascular: heart regular rate and rhythm, no murmur Lungs: clear to auscultation bilaterally Abdomen: soft, obese, nontender, nondistended, no obvious ascites, no peritoneal signs, normal bowel sounds, no organomegaly Rectal: Omitted Extremities: no clubbing  cyanosis or lower extremity edema bilaterally Skin: no lesions on visible extremities Neuro: No focal deficits. Cranial nerves intact  ASSESSMENT:  #1. Early satiety with postprandial bloating and fullness. Transient weight loss. Suspect gastroparesis. Risk factors are chronic narcotic use and long-standing diabetes which is poorly controlled #2. History of GERD and esophageal stricture. Asymptomatic from that standpoint #3. Musculoskeletal chest pain ongoing #4. Last colonoscopy September 2016. Diminutive adenomas and prior surgery #5. Multiple medical problems  PLAN:  #1. Solid-phase gastric cavity scan to rule out gastroparesis. We will contact him with results and further recommendations if any. Suspect he will need to eat small meals more frequently along with good diabetes control and minimizing narcotics #2. Reflux precautions with attention to weight loss #3. Continue PPI #4. Good diabetes control #5. Minimize narcotics #6. Surveillance colonoscopy around 2021 #7. Return to the care of your PCP

## 2016-06-03 NOTE — Patient Instructions (Signed)
You have been scheduled for a gastric emptying scan at [facility] on 06/19/2016 at 7:30am. Please arrive at least 15 minutes prior to your appointment for registration. Please make certain not to have anything to eat or drink after midnight the night before your test. Hold all stomach medications (ex: Zofran, phenergan, Reglan) 48 hours prior to your test. If you need to reschedule your appointment, please contact radiology scheduling at [phone]. _____________________________________________________________________ A gastric-emptying study measures how long it takes for food to move through your stomach. There are several ways to measure stomach emptying. In the most common test, you eat food that contains a small amount of radioactive material. A scanner that detects the movement of the radioactive material is placed over your abdomen to monitor the rate at which food leaves your stomach. This test normally takes about 4 hours to complete. _____________________________________________________________________

## 2016-06-09 DIAGNOSIS — G4733 Obstructive sleep apnea (adult) (pediatric): Secondary | ICD-10-CM | POA: Diagnosis not present

## 2016-06-09 NOTE — Telephone Encounter (Signed)
Completed and given to [facility]

## 2016-06-09 NOTE — Telephone Encounter (Signed)
Patient checking on form advised given to [facility] - pt wants confirmation that this form was faxed. He can be reached at [phone]

## 2016-06-09 NOTE — Telephone Encounter (Signed)
Please advise on status of form, thanks!

## 2016-06-09 NOTE — Telephone Encounter (Signed)
Lmtcb. Will await call back 

## 2016-06-09 NOTE — Telephone Encounter (Signed)
Please let patient know that PA form has been faxed back to [place] [employer] today; will await a decision from them Thanks.

## 2016-06-10 ENCOUNTER — Telehealth: Payer: Self-pay | Admitting: Family Medicine

## 2016-06-10 ENCOUNTER — Ambulatory Visit: Payer: Medicaid Other | Attending: Family Medicine | Admitting: Family Medicine

## 2016-06-10 ENCOUNTER — Encounter: Payer: Self-pay | Admitting: Family Medicine

## 2016-06-10 VITALS — BP 131/71 | HR 62 | Temp 98.7°F | Ht 66.0 in | Wt 216.6 lb

## 2016-06-10 DIAGNOSIS — Z79899 Other long term (current) drug therapy: Secondary | ICD-10-CM | POA: Insufficient documentation

## 2016-06-10 DIAGNOSIS — E1142 Type 2 diabetes mellitus with diabetic polyneuropathy: Secondary | ICD-10-CM | POA: Insufficient documentation

## 2016-06-10 DIAGNOSIS — Z87891 Personal history of nicotine dependence: Secondary | ICD-10-CM | POA: Insufficient documentation

## 2016-06-10 DIAGNOSIS — M069 Rheumatoid arthritis, unspecified: Secondary | ICD-10-CM

## 2016-06-10 DIAGNOSIS — Z7982 Long term (current) use of aspirin: Secondary | ICD-10-CM | POA: Insufficient documentation

## 2016-06-10 DIAGNOSIS — Z794 Long term (current) use of insulin: Secondary | ICD-10-CM | POA: Insufficient documentation

## 2016-06-10 DIAGNOSIS — G4733 Obstructive sleep apnea (adult) (pediatric): Secondary | ICD-10-CM | POA: Diagnosis not present

## 2016-06-10 DIAGNOSIS — G894 Chronic pain syndrome: Secondary | ICD-10-CM

## 2016-06-10 DIAGNOSIS — E119 Type 2 diabetes mellitus without complications: Secondary | ICD-10-CM | POA: Diagnosis not present

## 2016-06-10 DIAGNOSIS — G8929 Other chronic pain: Secondary | ICD-10-CM | POA: Diagnosis not present

## 2016-06-10 DIAGNOSIS — E785 Hyperlipidemia, unspecified: Secondary | ICD-10-CM | POA: Diagnosis not present

## 2016-06-10 LAB — GLUCOSE, POCT (MANUAL RESULT ENTRY): POC Glucose: 188 mg/dl — AB (ref 70–99)

## 2016-06-10 LAB — LIPID PANEL
CHOL/HDL RATIO: 2.6 ratio (ref <=5.0)
CHOLESTEROL: 92 mg/dL — AB (ref 125–200)
HDL: 35 mg/dL — ABNORMAL LOW (ref >=40)
LDL Cholesterol: 39 mg/dL (ref <130)
Triglycerides: 91 mg/dL (ref <150)
VLDL: 18 mg/dL (ref <30)

## 2016-06-10 LAB — POCT GLYCOSYLATED HEMOGLOBIN (HGB A1C): HEMOGLOBIN A1C: 8.5

## 2016-06-10 LAB — VITAMIN B12: VITAMIN B 12: 327 pg/mL (ref 200–1100)

## 2016-06-10 MED ORDER — PREGABALIN 50 MG PO CAPS: 50.0000 mg | ORAL_CAPSULE | Freq: Three times a day (TID) | ORAL | 3 refills | Status: DC

## 2016-06-10 MED ORDER — METFORMIN HCL ER 500 MG PO TB24: 1000.0000 mg | ORAL_TABLET | Freq: Two times a day (BID) | ORAL | 5 refills | Status: DC

## 2016-06-10 NOTE — Progress Notes (Signed)
Subjective:  Patient ID: Marco [name], male    DOB: 28-Apr-1956  Age: 60 y.o. MRN: [id number]  CC: Diabetes   HPI Marco [name] has diabetes, rheumatoid arthritis, chronic pain, OSA  presents for   1. CHRONIC DIABETES  Disease Monitoring  Blood Sugar Ranges:   Fasting 130  Post prandial 188-210  Polyuria: no   Visual problems: no   Medication Compliance: yes  Medication Side Effects  Hypoglycemia: no   Preventitive Health Care  Eye Exam: due in 07/2016   Foot Exam: done today   Diet pattern: eating 3 meals a day. Eating light. Not eating low carb   Exercise: no, reports he hurts too much to exercise.  2. Neuropathy: in hands and feet. Worsening over past 2 months. Has sensitivity and pain even with light touch. Trouble making a fist with his hands. Unable to walk bare foot.  No injury. He takes lyrica 25 mg BID and vicodin prescribed by [facility] management for his chronic pain.   Social History  Substance Use Topics  . Smoking status: Former Smoker    Packs/day: 0.50    Years: 10.00    Types: Cigarettes    Quit date: 08/12/2007  . Smokeless tobacco: Never Used  . Alcohol use No    Outpatient Medications Prior to Visit  Medication Sig Dispense Refill  . ACCU-CHEK FASTCLIX LANCETS MISC 1 each by Other route 3 (three) times daily. 102 each 11  . allopurinol (ZYLOPRIM) 100 MG tablet Take 1 tablet (100 mg total) by mouth daily. 30 tablet 1  . atorvastatin (LIPITOR) 40 MG tablet TAKE ONE TABLET BY MOUTH ONCE DAILY 90 tablet 0  . Blood Glucose Monitoring Suppl (ACCU-CHEK AVIVA PLUS) W/DEVICE KIT 1 Device by Does not apply route 3 (three) times daily after meals. E11.9 1 kit 0  . carvedilol (COREG) 12.5 MG tablet TAKE ONE TABLET BY MOUTH TWICE DAILY 60 tablet 2  . diltiazem (DILACOR XR) 180 MG 24 hr capsule TAKE ONE CAPSULE BY MOUTH ONCE DAILY 90 capsule 0  . EQ ASPIRIN ADULT LOW DOSE 81 MG EC tablet TAKE ONE TABLET BY MOUTH ONCE DAILY 90 tablet 0  . Eszopiclone 3 MG TABS  Take immediately before bedtime for sleep 30 tablet 5  . furosemide (LASIX) 40 MG tablet TAKE ONE TABLET BY MOUTH ONCE DAILY 30 tablet 2  . glucose blood (ACCU-CHEK AVIVA PLUS) test strip 1 each by Other route 3 (three) times daily. E11.9 100 each 12  . HYDROcodone-acetaminophen (NORCO/VICODIN) 5-325 MG tablet Take 1 tablet by mouth every 8 (eight) hours as needed for moderate pain. Per pain management    . insulin aspart (NOVOLOG) 100 UNIT/ML injection Inject 14-24 Units into the skin 3 (three) times daily with meals. Reported on 09/20/2015    . Insulin Pen Needle (BD PEN NEEDLE NANO U/F) 32G X 4 MM MISC 1 each by Does not apply route 3 (three) times daily. 100 each 11  . Ipratropium-Albuterol (COMBIVENT RESPIMAT) 20-100 MCG/ACT AERS respimat Inhale 1 puff into the lungs every 6 (six) hours. 4 g 12  . LEVEMIR FLEXTOUCH 100 UNIT/ML Pen INJECT 60 UNITS SUBCUTANEOUSLY ONCE DAILY AT  10PM 15 mL 3  . losartan (COZAAR) 50 MG tablet Take 1 tablet (50 mg total) by mouth daily. 90 tablet 3  . metFORMIN (GLUCOPHAGE-XR) 500 MG 24 hr tablet Take 500 mg by mouth 2 (two) times daily.     . pantoprazole (PROTONIX) 40 MG tablet Take 1 tablet (40 mg total)  by mouth daily. 30 tablet 5  . predniSONE (DELTASONE) 10 MG tablet TAKE ONE TABLET BY MOUTH ONCE DAILY WITH BREAKFAST 30 tablet 1  . pregabalin (LYRICA) 25 MG capsule Take 25 mg by mouth 2 (two) times daily.    . ranitidine (ZANTAC) 150 MG tablet Take 1 tablet (150 mg total) by mouth at bedtime. 30 tablet 5  . umeclidinium-vilanterol (ANORO ELLIPTA) 62.5-25 MCG/INH AEPB Inhale 1 puff into the lungs daily. 1 each 5  . zolpidem (AMBIEN) 10 MG tablet Take 10 mg by mouth at bedtime as needed for sleep.     No facility-administered medications prior to visit.     ROS Review of Systems  Constitutional: Positive for appetite change (early satiety ). Negative for chills, fatigue, fever and unexpected weight change.  HENT:       Dry mouth   Eyes: Negative for visual  disturbance.  Respiratory: Negative for cough and shortness of breath.   Cardiovascular: Negative for chest pain, palpitations and leg swelling.  Gastrointestinal: Negative for abdominal distention, abdominal pain, anal bleeding, blood in stool, constipation, diarrhea, nausea, rectal pain and vomiting.  Endocrine: Negative for polydipsia, polyphagia and polyuria.  Musculoskeletal: Negative for arthralgias, back pain, gait problem, joint swelling, myalgias and neck pain.  Skin: Negative for rash.  Allergic/Immunologic: Negative for immunocompromised state.  Neurological: Positive for numbness (hands and feet ). Negative for dizziness, weakness and headaches.  Hematological: Negative for adenopathy. Does not bruise/bleed easily.  Psychiatric/Behavioral: Negative for dysphoric mood, sleep disturbance and suicidal ideas. The patient is not nervous/anxious.     Objective:  BP 131/71 (BP Location: Left Arm, Patient Position: Sitting, Cuff Size: Small)   Pulse 62   Temp 98.7 F (37.1 C) (Oral)   Ht 5\' 6"  (1.676 m)   Wt 216 lb 9.6 oz (98.2 kg)   SpO2 95%   BMI 34.96 kg/m   BP/Weight 06/10/2016 06/03/2016 05/30/2016  Systolic BP 131 112 138  Diastolic BP 71 72 74  Wt. (Lbs) 216.6 212.13 213  BMI 34.96 34.24 34.38    Physical Exam  Constitutional: He appears well-developed and well-nourished. No distress.  HENT:  Head: Normocephalic and atraumatic.  Mostly edentulous   Neck: Normal range of motion. Neck supple.  Cardiovascular: Normal rate, regular rhythm, normal heart sounds and intact distal pulses.   Pulses:      Radial pulses are 2+ on the right side, and 2+ on the left side.       Dorsalis pedis pulses are 2+ on the right side, and 2+ on the left side.       Posterior tibial pulses are 2+ on the right side, and 2+ on the left side.  Pulmonary/Chest: Effort normal and breath sounds normal.  Musculoskeletal: He exhibits no edema.       Right upper arm: He exhibits tenderness. He  exhibits no bony tenderness, no swelling, no edema, no deformity and no laceration.       Left upper arm: He exhibits tenderness. He exhibits no bony tenderness, no swelling, no edema, no deformity and no laceration.       Right hand: He exhibits tenderness.       Left hand: He exhibits tenderness.       Right foot: There is tenderness.       Left foot: There is tenderness.  Tenderness to light touch in hands and feet No swelling or erythema   Neurological: He is alert.  Skin: Skin is warm and dry. No rash  noted. No erythema.  Psychiatric: He has a normal mood and affect.   Depression screen [facility] 2/9 06/10/2016 03/31/2016 03/31/2016  Decreased Interest 0 0 0  Down, Depressed, Hopeless 1 0 0  PHQ - 2 Score 1 0 0  Altered sleeping 3 3 -  Tired, decreased energy 1 2 -  Change in appetite 0 0 -  Feeling bad or failure about yourself  0 0 -  Trouble concentrating 0 0 -  Moving slowly or fidgety/restless 0 0 -  Suicidal thoughts 0 0 -  PHQ-9 Score 5 5 -  Some recent data might be hidden   GAD 7 : Generalized Anxiety Score 06/10/2016 03/31/2016 02/05/2016 11/13/2015  Nervous, Anxious, on Edge 0 0 0 0  Control/stop worrying 0 0 1 1  Worry too much - different things 1 0 0 2  Trouble relaxing 1 0 0 2  Restless 0 0 0 0  Easily annoyed or irritable 0 0 0 0  Afraid - awful might happen 0 0 0 2  Total GAD 7 Score 2 0 1 7     Lab Results  Component Value Date   HGBA1C 8.5 06/10/2016   CBG 188 Assessment & Plan:   Yonael was seen today for diabetes.  Diagnoses and all orders for this visit:  Type 2 diabetes mellitus without complication, with long-term current use of insulin (HCC) -     POCT glucose (manual entry) -     POCT glycosylated hemoglobin (Hb A1C) -     metFORMIN (GLUCOPHAGE-XR) 500 MG 24 hr tablet; Take 2 tablets (1,000 mg total) by mouth 2 (two) times daily. -     Lipid Panel  Diabetic polyneuropathy associated with type 2 diabetes mellitus (HCC) -     Vitamin B12 -      pregabalin (LYRICA) 50 MG capsule; Take 1 capsule (50 mg total) by mouth 3 (three) times daily.  Hyperlipidemia, unspecified hyperlipidemia type -     Lipid Panel    No orders of the defined types were placed in this encounter.   Follow-up: Return in about 6 weeks (around 07/22/2016) for neuropathy .   Boykin Nearing MD

## 2016-06-10 NOTE — Progress Notes (Signed)
Pt is here to follow up on diabetes

## 2016-06-10 NOTE — Assessment & Plan Note (Signed)
Neuropathic pain in hands and feet  Plan: Increase lyrica from 25 mg BID to 50 mg TID Check vit B12 level F/u in 6 weeks

## 2016-06-10 NOTE — Patient Instructions (Signed)
Marco [name] was seen today for diabetes.  Diagnoses and all orders for this visit:  Type 2 diabetes mellitus without complication, with long-term current use of insulin (HCC) -     POCT glucose (manual entry) -     POCT glycosylated hemoglobin (Hb A1C) -     metFORMIN (GLUCOPHAGE-XR) 500 MG 24 hr tablet; Take 2 tablets (1,000 mg total) by mouth 2 (two) times daily.  Diabetic polyneuropathy associated with type 2 diabetes mellitus (HCC) -     Vitamin B12 -     pregabalin (LYRICA) 50 MG capsule; Take 1 capsule (50 mg total) by mouth 3 (three) times daily.   Increase metformin to 1000 mg twice daily Increase lyrica to 50 mg three times a day for neuropathy   Work on reducing sugar intake and increasing exercise to reduce weight and improve diabetes control Goal A1c is 7-8   F/u in 6 weeks for neuropathy   Dr. Adrian Blackwater

## 2016-06-10 NOTE — Telephone Encounter (Signed)
Pt. Called requesting to be referred to out to [facility] pain management. Pt. States he had spoken with his PCP this morning during his appointment. Please f/u

## 2016-06-10 NOTE — Assessment & Plan Note (Signed)
A: slight decline with rise in A1c  P: Increase metformin to 1000 mg BID Continue current novolog and lantus dose Decrease carbs Increase exercise

## 2016-06-11 LAB — HM DIABETES EYE EXAM

## 2016-06-11 NOTE — Telephone Encounter (Signed)
Will route to PCP 

## 2016-06-11 NOTE — Telephone Encounter (Signed)
Pt returning call I let him know that we were waiting to here from company.Marco [name]

## 2016-06-11 NOTE — Telephone Encounter (Signed)
LMOM TCB x2

## 2016-06-12 MED ORDER — VITAMIN B-12 1000 MCG PO TABS: 1000.0000 ug | ORAL_TABLET | Freq: Every day | ORAL | 3 refills | Status: DC

## 2016-06-12 NOTE — Telephone Encounter (Signed)
OK- will forward back to KW to await approval/denial

## 2016-06-12 NOTE — Addendum Note (Signed)
Addended by: Boykin Nearing on: 06/12/2016 08:53 AM   Modules accepted: Orders

## 2016-06-12 NOTE — Telephone Encounter (Signed)
Patient called back - he is aware that we have completed PA and needs nothing further-pr

## 2016-06-12 NOTE — Telephone Encounter (Signed)
lmtcb for pt.  

## 2016-06-12 NOTE — Telephone Encounter (Signed)
Pain management referral placed.

## 2016-06-13 ENCOUNTER — Other Ambulatory Visit: Payer: Self-pay | Admitting: Family Medicine

## 2016-06-13 ENCOUNTER — Telehealth: Payer: Self-pay

## 2016-06-13 NOTE — Telephone Encounter (Signed)
Pt was called and informed of lab results. 

## 2016-06-19 ENCOUNTER — Encounter (HOSPITAL_COMMUNITY)
Admission: RE | Admit: 2016-06-19 | Discharge: 2016-06-19 | Disposition: A | Discharge status: Home / Self Care | Payer: Medicaid Other | Source: Ambulatory Visit | Attending: Internal Medicine | Admitting: Internal Medicine

## 2016-06-19 ENCOUNTER — Ambulatory Visit (HOSPITAL_COMMUNITY): Payer: Medicaid Other

## 2016-06-19 DIAGNOSIS — R110 Nausea: Secondary | ICD-10-CM | POA: Diagnosis present

## 2016-06-19 DIAGNOSIS — R140 Abdominal distension (gaseous): Secondary | ICD-10-CM | POA: Diagnosis present

## 2016-06-19 DIAGNOSIS — R0609 Other forms of dyspnea: Secondary | ICD-10-CM | POA: Diagnosis present

## 2016-06-19 DIAGNOSIS — R6881 Early satiety: Secondary | ICD-10-CM

## 2016-06-19 MED ORDER — TECHNETIUM TC 99M SULFUR COLLOID: 2.2000 | Freq: Once | INTRAVENOUS | Status: DC | PRN

## 2016-06-20 ENCOUNTER — Telehealth: Payer: Self-pay | Admitting: Internal Medicine

## 2016-06-20 NOTE — Telephone Encounter (Signed)
Pt called back to the clinic about the denial of prior authorization for medication. Pt can be reached at...[phone].Marco [name]

## 2016-06-20 NOTE — Telephone Encounter (Signed)
Spoke with pt, upset that his Johnnye Sima PA was denied d/t the quantity not being on PA form.  This form must be filled out completely and refaxed.    Forwarding to Katie to follow up on.

## 2016-06-23 DIAGNOSIS — R0600 Dyspnea, unspecified: Secondary | ICD-10-CM | POA: Diagnosis not present

## 2016-06-23 NOTE — Telephone Encounter (Signed)
I have refaxed the forms to [employer]. Will await a decision.

## 2016-06-26 NOTE — Telephone Encounter (Signed)
Katie have we received a decision on this yet? Thanks.

## 2016-07-01 ENCOUNTER — Ambulatory Visit (INDEPENDENT_AMBULATORY_CARE_PROVIDER_SITE_OTHER): Payer: Medicaid Other | Admitting: Sports Medicine

## 2016-07-01 ENCOUNTER — Encounter: Payer: Self-pay | Admitting: Sports Medicine

## 2016-07-01 DIAGNOSIS — M79671 Pain in right foot: Secondary | ICD-10-CM

## 2016-07-01 DIAGNOSIS — E1142 Type 2 diabetes mellitus with diabetic polyneuropathy: Secondary | ICD-10-CM | POA: Diagnosis not present

## 2016-07-01 DIAGNOSIS — B351 Tinea unguium: Secondary | ICD-10-CM | POA: Diagnosis not present

## 2016-07-01 DIAGNOSIS — M79672 Pain in left foot: Secondary | ICD-10-CM

## 2016-07-01 NOTE — Progress Notes (Signed)
Patient ID: Marco [name], male   DOB: 1956-02-27, 60 y.o.   MRN: [id number] Subjective: Marco [name] is a 60 y.o. male patient with history of diabetes who presents to office today complaining of long, painful nails while ambulating in shoes; unable to trim. Patient states that the glucose reading this morning was "good", around 180 mg/dl. Patient denies any new changes in medication or new problems. Patient denies any new cramping, numbness, burning or tingling in the legs.Continues with Lyrica and pain med recs.  Patient Active Problem List   Diagnosis Date Noted  . Diabetic polyneuropathy associated with type 2 diabetes mellitus ([place]) 06/10/2016  . Myalgia and myositis 03/31/2016  . Dyspnea 02/14/2016  . Dyspnea on exertion 02/09/2016  . Abdominal bloating 02/05/2016  . Chronic gouty arthritis 11/29/2015  . Ache in joint 11/27/2015  . LBP (low back pain) 11/27/2015  . Arthralgia of multiple joints 11/27/2015  . Psoriasis 11/27/2015  . Coronary artery calcification seen on CAT scan   . Breast pain in male 11/14/2015  . Sinusitis, chronic 10/16/2015  . Insomnia 10/16/2015  . Combined fat and carbohydrate induced hyperlipemia 10/04/2015  . Adult BMI 30+ 10/04/2015  . New onset of headaches after age 4 09/20/2015  . OSA (obstructive sleep apnea) 07/12/2015  . Joint pain 06/01/2015  . Low serum testosterone level 05/18/2015  . Depression 05/16/2015  . Chronic pain   . Rheumatoid arthritis ([place]) 02/05/2015  . Elevated troponin 12/24/2014  . CAD (coronary artery disease) 12/24/2014  . PAF (paroxysmal atrial fibrillation) ([place]) 12/24/2014  . Essential hypertension 12/24/2014  . HLD (hyperlipidemia) 12/24/2014  . CKD (chronic kidney disease) 12/24/2014  . PUD (peptic ulcer disease) 12/24/2014  . DM2 (diabetes mellitus, type 2) ([place]) 12/24/2014  . Chest pain 12/23/2014   Current Outpatient Prescriptions on File Prior to Visit  Medication Sig Dispense Refill  . ACCU-CHEK FASTCLIX  LANCETS MISC 1 each by Other route 3 (three) times daily. 102 each 11  . allopurinol (ZYLOPRIM) 100 MG tablet Take 1 tablet (100 mg total) by mouth daily. 30 tablet 1  . ANDROGEL PUMP 20.25 MG/ACT (1.62%) GEL Apply 4 application topically every morning.  5  . atorvastatin (LIPITOR) 40 MG tablet TAKE ONE TABLET BY MOUTH ONCE DAILY 90 tablet 0  . Blood Glucose Monitoring Suppl (ACCU-CHEK AVIVA PLUS) W/DEVICE KIT 1 Device by Does not apply route 3 (three) times daily after meals. E11.9 1 kit 0  . carvedilol (COREG) 12.5 MG tablet TAKE ONE TABLET BY MOUTH TWICE DAILY 60 tablet 2  . diltiazem (DILACOR XR) 180 MG 24 hr capsule TAKE ONE CAPSULE BY MOUTH ONCE DAILY 90 capsule 0  . EQ ASPIRIN ADULT LOW DOSE 81 MG EC tablet TAKE ONE TABLET BY MOUTH ONCE DAILY 90 tablet 0  . Eszopiclone 3 MG TABS Take immediately before bedtime for sleep 30 tablet 5  . furosemide (LASIX) 40 MG tablet TAKE ONE TABLET BY MOUTH ONCE DAILY 30 tablet 2  . glucose blood (ACCU-CHEK AVIVA PLUS) test strip 1 each by Other route 3 (three) times daily. E11.9 100 each 12  . HYDROcodone-acetaminophen (NORCO/VICODIN) 5-325 MG tablet Take 1 tablet by mouth every 8 (eight) hours as needed for moderate pain. Per pain management    . insulin aspart (NOVOLOG) 100 UNIT/ML injection Inject 14-24 Units into the skin 3 (three) times daily with meals. Reported on 09/20/2015    . Insulin Pen Needle (BD PEN NEEDLE NANO U/F) 32G X 4 MM MISC 1 each by Does not apply  route 3 (three) times daily. 100 each 11  . Ipratropium-Albuterol (COMBIVENT RESPIMAT) 20-100 MCG/ACT AERS respimat Inhale 1 puff into the lungs every 6 (six) hours. 4 g 12  . LEVEMIR FLEXTOUCH 100 UNIT/ML Pen INJECT 60 UNITS SUBCUTANEOUSLY ONCE DAILY AT  10  PM 15 mL 3  . losartan (COZAAR) 50 MG tablet Take 1 tablet (50 mg total) by mouth daily. 90 tablet 3  . metFORMIN (GLUCOPHAGE-XR) 500 MG 24 hr tablet Take 2 tablets (1,000 mg total) by mouth 2 (two) times daily. 120 tablet 5  .  pantoprazole (PROTONIX) 40 MG tablet Take 1 tablet (40 mg total) by mouth daily. 30 tablet 5  . predniSONE (DELTASONE) 10 MG tablet TAKE ONE TABLET BY MOUTH ONCE DAILY WITH BREAKFAST 30 tablet 1  . pregabalin (LYRICA) 50 MG capsule Take 1 capsule (50 mg total) by mouth 3 (three) times daily. 90 capsule 3  . ranitidine (ZANTAC) 150 MG tablet Take 1 tablet (150 mg total) by mouth at bedtime. 30 tablet 5  . Secukinumab (COSENTYX) 150 MG/ML SOSY Inject 150 mg into the skin.    [name] umeclidinium-vilanterol (ANORO ELLIPTA) 62.5-25 MCG/INH AEPB Inhale 1 puff into the lungs daily. 1 each 5  . vitamin B-12 (CYANOCOBALAMIN) 1000 MCG tablet Take 1 tablet (1,000 mcg total) by mouth daily. 30 tablet 3  . zolpidem (AMBIEN) 10 MG tablet Take 10 mg by mouth at bedtime as needed for sleep.     No current facility-administered medications on file prior to visit.    No Known Allergies  Recent Results (from the past 2160 hour(s))  POCT glucose (manual entry)     Status: Abnormal   Collection Time: 06/10/16 12:11 PM  Result Value Ref Range   POC Glucose 188 (A) 70 - 99 mg/dl  POCT glycosylated hemoglobin (Hb A1C)     Status: None   Collection Time: 06/10/16 12:21 PM  Result Value Ref Range   Hemoglobin A1C 8.5   Vitamin B12     Status: None   Collection Time: 06/10/16 12:53 PM  Result Value Ref Range   Vitamin B-12 327 200 - 1,100 pg/mL  Lipid Panel     Status: Abnormal   Collection Time: 06/10/16 12:53 PM  Result Value Ref Range   Cholesterol 92 (L) 125 - 200 mg/dL   Triglycerides 91 <150 mg/dL   HDL 35 (L) >=40 mg/dL   Total CHOL/HDL Ratio 2.6 <=5.0 Ratio   VLDL 18 <30 mg/dL   LDL Cholesterol 39 <130 mg/dL    Comment:   Total Cholesterol/HDL Ratio:CHD Risk                        Coronary Heart Disease Risk Table                                        Men       Women          1/2 Average Risk              3.4        3.3              Average Risk              5.0        4.4           2X Average Risk  9.6        7.1           3X Average Risk             23.4       11.0 Use the calculated Patient Ratio above and the CHD Risk table  to determine the patient's CHD Risk.     Objective: General: Patient is awake, alert, and oriented x 3 and in no acute distress.  Integument: Skin is warm, dry and supple bilateral. Nails are tender, long, thickened and dystrophic with subungual debris, consistent with onychomycosis, 1-5 bilateral. No signs of infection. No open lesions or preulcerative lesions present bilateral. Remaining integument unremarkable.  Vasculature:  Dorsalis Pedis pulse 2/4 bilateral. Posterior Tibial pulse  1/4 bilateral.  Capillary fill time <3 sec 1-5 bilateral. Scant hair growth to the level of the digits. Temperature gradient within normal limits. No varicosities present bilateral. No edema present bilateral.   Neurology: The patient has intact sensation measured with a 5.07/10g Semmes Weinstein Monofilament at all pedal sites bilateral. Vibratory sensation diminished bilateral with tuning fork. No Babinski sign present bilateral. Subjective neuropathy pain.   Musculoskeletal: No symptomatic pedal deformities noted bilateral. Muscular strength 5/5 in all lower extremity muscular groups bilateral without pain on range of motion. No tenderness with calf compression bilateral.  Assessment and Plan: Problem List Items Addressed This Visit    None    Visit Diagnoses    Dermatophytosis of nail    -  Primary   Type 2 diabetes mellitus with polyneuropathy (HCC)       Foot pain, bilateral          -Examined patient. -Discussed and educated patient on diabetic foot care, especially with regards to the vascular, neurological and musculoskeletal systems.  -Stressed the importance of good glycemic control and the detriment of not controlling glucose levels in relation to the foot. -Mechanically debrided all nails 1-5 bilateral using sterile nail nipper and filed with  dremel without incident  -Recommend to get Capsaicin cream -Patient to follow up with pain management doctor re: Lyrica; titrate Lyrica to a therapeutic dose.   -Answered all patient questions -Patient to return  in 3 months for at risk foot care -Patient advised to call the office if any problems or questions arise in the meantime.  Landis Martins, DPM

## 2016-07-01 NOTE — Patient Instructions (Signed)
Capsaicin cream Over the Counter

## 2016-07-01 NOTE — Telephone Encounter (Signed)
Please see other phone note

## 2016-07-04 NOTE — Telephone Encounter (Signed)
Katie please advise if you have heard anything back about this PA.  thanks

## 2016-07-04 NOTE — Telephone Encounter (Signed)
No updates as of today.  

## 2016-07-09 NOTE — Telephone Encounter (Signed)
Marco [name] have there been an updates on the pt's PA? Thanks.

## 2016-07-11 ENCOUNTER — Other Ambulatory Visit: Payer: Self-pay | Admitting: Family Medicine

## 2016-07-11 NOTE — Telephone Encounter (Signed)
Please inform patient that Ambien Rx ready for pick up

## 2016-07-11 NOTE — Telephone Encounter (Signed)
error 

## 2016-07-11 NOTE — Telephone Encounter (Signed)
Pt was called and informed of script being ready for pick up.

## 2016-07-14 NOTE — Telephone Encounter (Signed)
Joellen Jersey do you have any updates on this matter? Thanks.

## 2016-07-18 NOTE — Telephone Encounter (Signed)
Katie have you received any updates on this PA?

## 2016-07-22 ENCOUNTER — Encounter: Payer: Self-pay | Admitting: Family Medicine

## 2016-07-22 DIAGNOSIS — E11319 Type 2 diabetes mellitus with unspecified diabetic retinopathy without macular edema: Secondary | ICD-10-CM | POA: Insufficient documentation

## 2016-07-25 NOTE — Telephone Encounter (Signed)
Will route message to [facility] to follow up on.  thanks

## 2016-07-30 ENCOUNTER — Other Ambulatory Visit: Payer: Self-pay | Admitting: Family Medicine

## 2016-07-31 NOTE — Telephone Encounter (Signed)
Per Joellen Jersey she is going to contact the company today and check status. Joellen Jersey states that there was something wrong with the initial PA done so this may be why there is such a delay.  Will send to [facility]

## 2016-08-10 ENCOUNTER — Other Ambulatory Visit: Payer: Self-pay | Admitting: Family Medicine

## 2016-08-10 DIAGNOSIS — K279 Peptic ulcer, site unspecified, unspecified as acute or chronic, without hemorrhage or perforation: Secondary | ICD-10-CM

## 2016-08-12 ENCOUNTER — Other Ambulatory Visit: Payer: Self-pay | Admitting: Family Medicine

## 2016-08-12 DIAGNOSIS — M2550 Pain in unspecified joint: Secondary | ICD-10-CM

## 2016-08-19 ENCOUNTER — Encounter: Payer: Self-pay | Admitting: Family Medicine

## 2016-08-19 ENCOUNTER — Ambulatory Visit: Payer: Medicaid Other | Attending: Family Medicine | Admitting: Family Medicine

## 2016-08-19 VITALS — BP 153/80 | HR 67 | Temp 98.5°F | Ht 66.0 in | Wt 229.2 lb

## 2016-08-19 DIAGNOSIS — G4700 Insomnia, unspecified: Secondary | ICD-10-CM | POA: Diagnosis not present

## 2016-08-19 DIAGNOSIS — E119 Type 2 diabetes mellitus without complications: Secondary | ICD-10-CM | POA: Diagnosis not present

## 2016-08-19 DIAGNOSIS — M069 Rheumatoid arthritis, unspecified: Secondary | ICD-10-CM | POA: Insufficient documentation

## 2016-08-19 DIAGNOSIS — Z87891 Personal history of nicotine dependence: Secondary | ICD-10-CM | POA: Insufficient documentation

## 2016-08-19 DIAGNOSIS — F5101 Primary insomnia: Secondary | ICD-10-CM

## 2016-08-19 DIAGNOSIS — G4733 Obstructive sleep apnea (adult) (pediatric): Secondary | ICD-10-CM | POA: Insufficient documentation

## 2016-08-19 DIAGNOSIS — Z7982 Long term (current) use of aspirin: Secondary | ICD-10-CM | POA: Insufficient documentation

## 2016-08-19 DIAGNOSIS — R413 Other amnesia: Secondary | ICD-10-CM | POA: Insufficient documentation

## 2016-08-19 DIAGNOSIS — G8929 Other chronic pain: Secondary | ICD-10-CM | POA: Diagnosis not present

## 2016-08-19 DIAGNOSIS — Z794 Long term (current) use of insulin: Secondary | ICD-10-CM | POA: Diagnosis not present

## 2016-08-19 DIAGNOSIS — E1142 Type 2 diabetes mellitus with diabetic polyneuropathy: Secondary | ICD-10-CM | POA: Diagnosis not present

## 2016-08-19 DIAGNOSIS — I10 Essential (primary) hypertension: Secondary | ICD-10-CM | POA: Diagnosis not present

## 2016-08-19 LAB — POCT GLYCOSYLATED HEMOGLOBIN (HGB A1C): Hemoglobin A1C: 7.7

## 2016-08-19 LAB — GLUCOSE, POCT (MANUAL RESULT ENTRY): POC Glucose: 203 mg/dl — AB (ref 70–99)

## 2016-08-19 MED ORDER — AMITRIPTYLINE HCL 50 MG PO TABS: 50.0000 mg | ORAL_TABLET | Freq: Every day | ORAL | 1 refills | Status: DC

## 2016-08-19 MED ORDER — LIDOCAINE 5 % EX OINT: 1.0000 "application " | TOPICAL_OINTMENT | Freq: Four times a day (QID) | CUTANEOUS | 2 refills | Status: DC | PRN

## 2016-08-19 MED ORDER — LOSARTAN POTASSIUM 100 MG PO TABS: 100.0000 mg | ORAL_TABLET | Freq: Every day | ORAL | 5 refills | Status: DC

## 2016-08-19 MED ORDER — PREGABALIN 100 MG PO CAPS: 100.0000 mg | ORAL_CAPSULE | Freq: Three times a day (TID) | ORAL | 5 refills | Status: DC

## 2016-08-19 NOTE — Assessment & Plan Note (Signed)
Hypertensive without peripheral edema  Stop lasix Increase losartan to 100 mg daily

## 2016-08-19 NOTE — Progress Notes (Signed)
Subjective:  Patient ID: Marco [name], male    DOB: Mar 13, 1956  Age: 61 y.o. MRN: [id number]  CC: Foot Pain   HPI Norvin Ohlin has diabetes, rheumatoid arthritis, chronic pain, OSA  presents for   1. CHRONIC DIABETES  Disease Monitoring  Blood Sugar Ranges:   Fasting 130  Post prandial 188-210  Polyuria: no   Visual problems: no   Medication Compliance: yes  Medication Side Effects  Hypoglycemia: no   Preventitive Health Care  Eye Exam: due in 07/2016   Foot Exam: done today   Diet pattern: eating 3 meals a day. Eating light. Not eating low carb   Exercise: no, reports he hurts too much to exercise.  2. Neuropathy: in hands and feet. Worsening over past 2 months. Has sensitivity and pain even with light touch. Trouble making a fist with his hands. Unable to walk bare foot.  No injury. He takes lyrica 50 mg TID and vicodin prescribed by [facility] for his chronic pain. He has insomnia. He had nerve conduction test done on 01/15/2016  that confirmed sensory polyneuropathy affecting the lower extremities.   3. Depressed mood: he reports depressed mood with forgetfulness. He tried to text and call his mother who has been deceased for 5 years.   Social History  Substance Use Topics  . Smoking status: Former Smoker    Packs/day: 0.50    Years: 10.00    Types: Cigarettes    Quit date: 08/12/2007  . Smokeless tobacco: Never Used  . Alcohol use No    Outpatient Medications Prior to Visit  Medication Sig Dispense Refill  . ACCU-CHEK AVIVA PLUS test strip USE ONE STRIP TO CHECK BLOOD GLUCOSE 3 TIMES DAILY 100 each 12  . ACCU-CHEK FASTCLIX LANCETS MISC 1 each by Other route 3 (three) times daily. 102 each 11  . allopurinol (ZYLOPRIM) 100 MG tablet Take 1 tablet (100 mg total) by mouth daily. 30 tablet 1  . ANDROGEL PUMP 20.25 MG/ACT (1.62%) GEL Apply 4 application topically every morning.  5  . atorvastatin (LIPITOR) 40 MG tablet TAKE ONE TABLET BY MOUTH ONCE DAILY 90  tablet 0  . Blood Glucose Monitoring Suppl (ACCU-CHEK AVIVA PLUS) W/DEVICE KIT 1 Device by Does not apply route 3 (three) times daily after meals. E11.9 1 kit 0  . carvedilol (COREG) 12.5 MG tablet TAKE ONE TABLET BY MOUTH TWICE DAILY 180 tablet 0  . diltiazem (DILACOR XR) 180 MG 24 hr capsule TAKE ONE CAPSULE BY MOUTH ONCE DAILY 90 capsule 0  . EQ ASPIRIN ADULT LOW DOSE 81 MG EC tablet TAKE ONE TABLET BY MOUTH ONCE DAILY 90 tablet 0  . furosemide (LASIX) 40 MG tablet TAKE ONE TABLET BY MOUTH ONCE DAILY 90 tablet 0  . glucose blood (ACCU-CHEK AVIVA PLUS) test strip 1 each by Other route 3 (three) times daily. E11.9 100 each 12  . HYDROcodone-acetaminophen (NORCO/VICODIN) 5-325 MG tablet Take 1 tablet by mouth every 8 (eight) hours as needed for moderate pain. Per pain management    . insulin aspart (NOVOLOG) 100 UNIT/ML injection Inject 14-24 Units into the skin 3 (three) times daily with meals. Reported on 09/20/2015    . Insulin Pen Needle (BD PEN NEEDLE NANO U/F) 32G X 4 MM MISC 1 each by Does not apply route 3 (three) times daily. 100 each 11  . Ipratropium-Albuterol (COMBIVENT RESPIMAT) 20-100 MCG/ACT AERS respimat Inhale 1 puff into the lungs every 6 (six) hours. 4 g 12  . LEVEMIR FLEXTOUCH  100 UNIT/ML Pen INJECT 60 UNITS SUBCUTANEOUSLY ONCE DAILY AT  10  PM 15 mL 3  . losartan (COZAAR) 50 MG tablet Take 1 tablet (50 mg total) by mouth daily. 90 tablet 3  . metFORMIN (GLUCOPHAGE-XR) 500 MG 24 hr tablet Take 2 tablets (1,000 mg total) by mouth 2 (two) times daily. 120 tablet 5  . pantoprazole (PROTONIX) 40 MG tablet Take 1 tablet (40 mg total) by mouth daily. 30 tablet 5  . predniSONE (DELTASONE) 10 MG tablet TAKE ONE TABLET BY MOUTH ONCE DAILY WITH BREAKFAST 30 tablet 1  . pregabalin (LYRICA) 50 MG capsule Take 1 capsule (50 mg total) by mouth 3 (three) times daily. 90 capsule 3  . ranitidine (ZANTAC) 150 MG tablet TAKE ONE TABLET BY MOUTH AT BEDTIME 90 tablet 0  . Secukinumab (COSENTYX) 150  MG/ML SOSY Inject 150 mg into the skin.    [name] umeclidinium-vilanterol (ANORO ELLIPTA) 62.5-25 MCG/INH AEPB Inhale 1 puff into the lungs daily. 1 each 5  . vitamin B-12 (CYANOCOBALAMIN) 1000 MCG tablet Take 1 tablet (1,000 mcg total) by mouth daily. 30 tablet 3  . zolpidem (AMBIEN) 10 MG tablet Take 10 mg by mouth at bedtime as needed for sleep.    [name] zolpidem (AMBIEN) 5 MG tablet TAKE ONE TABLET BY MOUTH ONCE DAILY AT BEDTIME AS NEEDED FOR SLEEP 30 tablet 5   No facility-administered medications prior to visit.     ROS Review of Systems  Constitutional: Positive for appetite change (early satiety ). Negative for chills, fatigue, fever and unexpected weight change.  HENT:       Dry mouth   Eyes: Negative for visual disturbance.  Respiratory: Negative for cough and shortness of breath.   Cardiovascular: Negative for chest pain, palpitations and leg swelling.  Gastrointestinal: Negative for abdominal distention, abdominal pain, anal bleeding, blood in stool, constipation, diarrhea, nausea, rectal pain and vomiting.  Endocrine: Negative for polydipsia, polyphagia and polyuria.  Musculoskeletal: Negative for arthralgias, back pain, gait problem, joint swelling, myalgias and neck pain.  Skin: Negative for rash.  Allergic/Immunologic: Negative for immunocompromised state.  Neurological: Positive for numbness (hands and feet ). Negative for dizziness, weakness and headaches.  Hematological: Negative for adenopathy. Does not bruise/bleed easily.  Psychiatric/Behavioral: Positive for dysphoric mood. Negative for sleep disturbance and suicidal ideas. The patient is not nervous/anxious.     Objective:  BP (!) 153/80 (BP Location: Left Arm, Patient Position: Sitting, Cuff Size: Small)   Pulse 67   Temp 98.5 F (36.9 C) (Oral)   Ht _0  (1.676 m)   Wt 229 lb 3.2 oz (104 kg)   SpO2 94%   BMI 36.99 kg/m   BP/Weight 08/19/2016 06/10/2016 01/60/1093  Systolic BP 235 573 220  Diastolic BP 80 71 72    Wt. (Lbs) 229.2 216.6 212.13  BMI 36.99 34.96 34.24    Physical Exam  Constitutional: He appears well-developed and well-nourished. No distress.  HENT:  Head: Normocephalic and atraumatic.  Mostly edentulous   Neck: Normal range of motion. Neck supple.  Cardiovascular: Normal rate, regular rhythm, normal heart sounds and intact distal pulses.   Pulses:      Radial pulses are 2+ on the right side, and 2+ on the left side.       Dorsalis pedis pulses are 2+ on the right side, and 2+ on the left side.       Posterior tibial pulses are 2+ on the right side, and 2+ on the left side.  Pulmonary/Chest:  Effort normal and breath sounds normal.  Musculoskeletal: He exhibits no edema.       Right upper arm: He exhibits tenderness. He exhibits no bony tenderness, no swelling, no edema, no deformity and no laceration.       Left upper arm: He exhibits tenderness. He exhibits no bony tenderness, no swelling, no edema, no deformity and no laceration.       Right hand: He exhibits tenderness.       Left hand: He exhibits tenderness.       Right foot: There is tenderness.       Left foot: There is tenderness.       Feet:  Tenderness to light touch in hands and feet No swelling or erythema   Neurological: He is alert.  Skin: Skin is warm and dry. No rash noted. No erythema.  Psychiatric: He exhibits a depressed mood.   Lab Results  Component Value Date   HGBA1C 8.5 06/10/2016   CBG 203  Depression screen [facility] 2/9 08/19/2016 06/10/2016 03/31/2016  Decreased Interest 2 0 0  Down, Depressed, Hopeless 3 1 0  PHQ - 2 Score 5 1 0  Altered sleeping _0 Tired, decreased energy _1 Change in appetite 0 0 0  Feeling bad or failure about yourself  1 0 0  Trouble concentrating 0 0 0  Moving slowly or fidgety/restless 0 0 0  Suicidal thoughts 2 0 0  PHQ-9 Score _2 Some recent data might be hidden   GAD 7 : Generalized Anxiety Score 08/19/2016 06/10/2016 03/31/2016 02/05/2016  Nervous,  Anxious, on Edge 2 0 0 0  Control/stop worrying 3 0 0 1  Worry too much - different things 3 1 0 0  Trouble relaxing 2 1 0 0  Restless 0 0 0 0  Easily annoyed or irritable 0 0 0 0  Afraid - awful might happen 2 0 0 0  Total GAD 7 Score 12 2 0 1     Lab Results  Component Value Date   HGBA1C 8.5 06/10/2016   Lab Results  Component Value Date   HGBA1C 7.7 08/19/2016    CBG 188 Assessment & Plan:   Denzil was seen today for foot pain.  Diagnoses and all orders for this visit:  Type 2 diabetes mellitus without complication, with long-term current use of insulin (HCC) -     POCT glucose (manual entry) -     POCT glycosylated hemoglobin (Hb A1C) -     losartan (COZAAR) 100 MG tablet; Take 1 tablet (100 mg total) by mouth daily.  Diabetic polyneuropathy associated with type 2 diabetes mellitus (HCC) -     lidocaine (XYLOCAINE) 5 % ointment; Apply 1 application topically 4 (four) times daily as needed. Apply to feet -     Ambulatory referral to Neurology -     amitriptyline (ELAVIL) 50 MG tablet; Take 1 tablet (50 mg total) by mouth at bedtime. -     pregabalin (LYRICA) 100 MG capsule; Take 1 capsule (100 mg total) by mouth 3 (three) times daily.  Memory loss -     Ambulatory referral to Neurology  Primary insomnia -     amitriptyline (ELAVIL) 50 MG tablet; Take 1 tablet (50 mg total) by mouth at bedtime.  Essential hypertension -     losartan (COZAAR) 100 MG tablet; Take 1 tablet (100 mg total) by mouth daily.    No orders of the defined types were  placed in this encounter.   Follow-up: Return in about 2 weeks (around 09/02/2016) for neuropathy .   Boykin Nearing MD

## 2016-08-19 NOTE — Assessment & Plan Note (Signed)
Painful diabetic neuropathy with depression and insomnia Increase lyrica to 100 mg TID Add elavil 50 mg nightly with plan to titrate up to 150 mg nightly

## 2016-08-19 NOTE — Patient Instructions (Addendum)
Daquan was seen today for foot pain.  Diagnoses and all orders for this visit:  Type 2 diabetes mellitus without complication, with long-term current use of insulin (HCC) -     POCT glucose (manual entry) -     POCT glycosylated hemoglobin (Hb A1C) -     losartan (COZAAR) 100 MG tablet; Take 1 tablet (100 mg total) by mouth daily.  Diabetic polyneuropathy associated with type 2 diabetes mellitus (HCC) -     lidocaine (XYLOCAINE) 5 % ointment; Apply 1 application topically 4 (four) times daily as needed. Apply to feet -     Ambulatory referral to Neurology -     amitriptyline (ELAVIL) 50 MG tablet; Take 1 tablet (50 mg total) by mouth at bedtime. -     pregabalin (LYRICA) 100 MG capsule; Take 1 capsule (100 mg total) by mouth 3 (three) times daily.  Memory loss -     Ambulatory referral to Neurology  Primary insomnia -     amitriptyline (ELAVIL) 50 MG tablet; Take 1 tablet (50 mg total) by mouth at bedtime.  Essential hypertension -     losartan (COZAAR) 100 MG tablet; Take 1 tablet (100 mg total) by mouth daily.   STOP lasix   F/u in 2 weeks for foot pains due to neuropathy   Dr. Adrian Blackwater

## 2016-08-22 ENCOUNTER — Other Ambulatory Visit: Payer: Self-pay | Admitting: Family Medicine

## 2016-08-22 DIAGNOSIS — E119 Type 2 diabetes mellitus without complications: Secondary | ICD-10-CM

## 2016-08-22 NOTE — Telephone Encounter (Signed)
Pt. Called stating that he needs a refill on Pin needles and needs the Rx sent to [place].  Pt. States he does not have any for tonight. Please f/u

## 2016-09-02 ENCOUNTER — Telehealth: Payer: Self-pay | Admitting: Family Medicine

## 2016-09-02 ENCOUNTER — Inpatient Hospital Stay (HOSPITAL_COMMUNITY)
Admission: EM | Admit: 2016-09-02 | Discharge: 2016-09-06 | DRG: 193 | Disposition: A | Discharge status: Home / Self Care | Payer: Medicaid Other | Attending: Internal Medicine | Admitting: Internal Medicine

## 2016-09-02 ENCOUNTER — Emergency Department (HOSPITAL_COMMUNITY): Payer: Medicaid Other

## 2016-09-02 ENCOUNTER — Ambulatory Visit (HOSPITAL_BASED_OUTPATIENT_CLINIC_OR_DEPARTMENT_OTHER): Payer: Medicaid Other | Admitting: Family Medicine

## 2016-09-02 ENCOUNTER — Other Ambulatory Visit: Payer: Self-pay

## 2016-09-02 ENCOUNTER — Ambulatory Visit (HOSPITAL_COMMUNITY)
Admission: RE | Admit: 2016-09-02 | Discharge: 2016-09-02 | Disposition: A | Discharge status: Home / Self Care | Payer: Medicaid Other | Source: Ambulatory Visit | Attending: Family Medicine | Admitting: Family Medicine

## 2016-09-02 ENCOUNTER — Encounter (HOSPITAL_COMMUNITY): Payer: Self-pay | Admitting: Physician Assistant

## 2016-09-02 ENCOUNTER — Encounter: Payer: Self-pay | Admitting: Family Medicine

## 2016-09-02 VITALS — BP 168/90 | HR 74 | Temp 98.1°F | Ht 66.0 in | Wt 240.2 lb

## 2016-09-02 DIAGNOSIS — G8929 Other chronic pain: Secondary | ICD-10-CM | POA: Diagnosis present

## 2016-09-02 DIAGNOSIS — E118 Type 2 diabetes mellitus with unspecified complications: Secondary | ICD-10-CM

## 2016-09-02 DIAGNOSIS — R7989 Other specified abnormal findings of blood chemistry: Secondary | ICD-10-CM

## 2016-09-02 DIAGNOSIS — I252 Old myocardial infarction: Secondary | ICD-10-CM

## 2016-09-02 DIAGNOSIS — J189 Pneumonia, unspecified organism: Principal | ICD-10-CM

## 2016-09-02 DIAGNOSIS — Z79899 Other long term (current) drug therapy: Secondary | ICD-10-CM

## 2016-09-02 DIAGNOSIS — Z87891 Personal history of nicotine dependence: Secondary | ICD-10-CM

## 2016-09-02 DIAGNOSIS — I480 Paroxysmal atrial fibrillation: Secondary | ICD-10-CM | POA: Diagnosis present

## 2016-09-02 DIAGNOSIS — J181 Lobar pneumonia, unspecified organism: Secondary | ICD-10-CM | POA: Insufficient documentation

## 2016-09-02 DIAGNOSIS — K219 Gastro-esophageal reflux disease without esophagitis: Secondary | ICD-10-CM | POA: Diagnosis present

## 2016-09-02 DIAGNOSIS — E1122 Type 2 diabetes mellitus with diabetic chronic kidney disease: Secondary | ICD-10-CM | POA: Diagnosis present

## 2016-09-02 DIAGNOSIS — E876 Hypokalemia: Secondary | ICD-10-CM | POA: Diagnosis not present

## 2016-09-02 DIAGNOSIS — N179 Acute kidney failure, unspecified: Secondary | ICD-10-CM | POA: Diagnosis present

## 2016-09-02 DIAGNOSIS — I130 Hypertensive heart and chronic kidney disease with heart failure and stage 1 through stage 4 chronic kidney disease, or unspecified chronic kidney disease: Secondary | ICD-10-CM | POA: Diagnosis present

## 2016-09-02 DIAGNOSIS — R042 Hemoptysis: Secondary | ICD-10-CM | POA: Diagnosis not present

## 2016-09-02 DIAGNOSIS — G4733 Obstructive sleep apnea (adult) (pediatric): Secondary | ICD-10-CM | POA: Diagnosis present

## 2016-09-02 DIAGNOSIS — J9601 Acute respiratory failure with hypoxia: Secondary | ICD-10-CM | POA: Diagnosis present

## 2016-09-02 DIAGNOSIS — IMO0002 Reserved for concepts with insufficient information to code with codable children: Secondary | ICD-10-CM | POA: Insufficient documentation

## 2016-09-02 DIAGNOSIS — N182 Chronic kidney disease, stage 2 (mild): Secondary | ICD-10-CM | POA: Diagnosis present

## 2016-09-02 DIAGNOSIS — Z794 Long term (current) use of insulin: Secondary | ICD-10-CM

## 2016-09-02 DIAGNOSIS — E669 Obesity, unspecified: Secondary | ICD-10-CM | POA: Diagnosis present

## 2016-09-02 DIAGNOSIS — J329 Chronic sinusitis, unspecified: Secondary | ICD-10-CM | POA: Diagnosis present

## 2016-09-02 DIAGNOSIS — E875 Hyperkalemia: Secondary | ICD-10-CM | POA: Diagnosis present

## 2016-09-02 DIAGNOSIS — G4700 Insomnia, unspecified: Secondary | ICD-10-CM | POA: Diagnosis present

## 2016-09-02 DIAGNOSIS — M1A9XX0 Chronic gout, unspecified, without tophus (tophi): Secondary | ICD-10-CM | POA: Diagnosis present

## 2016-09-02 DIAGNOSIS — E119 Type 2 diabetes mellitus without complications: Secondary | ICD-10-CM

## 2016-09-02 DIAGNOSIS — Z6836 Body mass index (BMI) 36.0-36.9, adult: Secondary | ICD-10-CM

## 2016-09-02 DIAGNOSIS — N183 Chronic kidney disease, stage 3 (moderate): Secondary | ICD-10-CM

## 2016-09-02 DIAGNOSIS — I5043 Acute on chronic combined systolic (congestive) and diastolic (congestive) heart failure: Secondary | ICD-10-CM | POA: Diagnosis present

## 2016-09-02 DIAGNOSIS — Z7952 Long term (current) use of systemic steroids: Secondary | ICD-10-CM

## 2016-09-02 DIAGNOSIS — F5104 Psychophysiologic insomnia: Secondary | ICD-10-CM | POA: Diagnosis present

## 2016-09-02 DIAGNOSIS — K279 Peptic ulcer, site unspecified, unspecified as acute or chronic, without hemorrhage or perforation: Secondary | ICD-10-CM | POA: Diagnosis present

## 2016-09-02 DIAGNOSIS — M2550 Pain in unspecified joint: Secondary | ICD-10-CM | POA: Diagnosis present

## 2016-09-02 DIAGNOSIS — M069 Rheumatoid arthritis, unspecified: Secondary | ICD-10-CM | POA: Diagnosis present

## 2016-09-02 DIAGNOSIS — E1165 Type 2 diabetes mellitus with hyperglycemia: Secondary | ICD-10-CM

## 2016-09-02 DIAGNOSIS — E785 Hyperlipidemia, unspecified: Secondary | ICD-10-CM | POA: Diagnosis present

## 2016-09-02 DIAGNOSIS — J9600 Acute respiratory failure, unspecified whether with hypoxia or hypercapnia: Secondary | ICD-10-CM | POA: Diagnosis present

## 2016-09-02 DIAGNOSIS — E1142 Type 2 diabetes mellitus with diabetic polyneuropathy: Secondary | ICD-10-CM | POA: Diagnosis present

## 2016-09-02 DIAGNOSIS — N189 Chronic kidney disease, unspecified: Secondary | ICD-10-CM | POA: Diagnosis present

## 2016-09-02 DIAGNOSIS — F329 Major depressive disorder, single episode, unspecified: Secondary | ICD-10-CM | POA: Diagnosis present

## 2016-09-02 DIAGNOSIS — I428 Other cardiomyopathies: Secondary | ICD-10-CM | POA: Diagnosis present

## 2016-09-02 DIAGNOSIS — I10 Essential (primary) hypertension: Secondary | ICD-10-CM | POA: Diagnosis present

## 2016-09-02 DIAGNOSIS — E1140 Type 2 diabetes mellitus with diabetic neuropathy, unspecified: Secondary | ICD-10-CM | POA: Insufficient documentation

## 2016-09-02 DIAGNOSIS — F32A Depression, unspecified: Secondary | ICD-10-CM | POA: Diagnosis present

## 2016-09-02 DIAGNOSIS — I2510 Atherosclerotic heart disease of native coronary artery without angina pectoris: Secondary | ICD-10-CM | POA: Diagnosis present

## 2016-09-02 DIAGNOSIS — M1A00X0 Idiopathic chronic gout, unspecified site, without tophus (tophi): Secondary | ICD-10-CM | POA: Diagnosis present

## 2016-09-02 LAB — CBC
HCT: 41.7 % (ref 38.5–50.0)
HEMOGLOBIN: 13.5 g/dL (ref 13.2–17.1)
MCH: 28.9 pg (ref 27.0–33.0)
MCHC: 32.4 g/dL (ref 32.0–36.0)
MCV: 89.3 fL (ref 80.0–100.0)
MPV: 11 fL (ref 7.5–12.5)
Platelets: 263 10*3/uL (ref 140–400)
RBC: 4.67 MIL/uL (ref 4.20–5.80)
RDW: 17.2 % — ABNORMAL HIGH (ref 11.0–15.0)
WBC: 10 10*3/uL (ref 3.8–10.8)

## 2016-09-02 LAB — COMPLETE METABOLIC PANEL WITH GFR
ALK PHOS: 33 U/L — AB (ref 40–115)
ALT: 22 U/L (ref 9–46)
AST: 11 U/L (ref 10–35)
Albumin: 3.3 g/dL — ABNORMAL LOW (ref 3.6–5.1)
BILIRUBIN TOTAL: 0.4 mg/dL (ref 0.2–1.2)
BUN: 18 mg/dL (ref 7–25)
CO2: 25 mmol/L (ref 20–31)
CREATININE: 1.38 mg/dL — AB (ref 0.70–1.25)
Calcium: 8.8 mg/dL (ref 8.6–10.3)
Chloride: 108 mmol/L (ref 98–110)
GFR, Est African American: 63 mL/min (ref >=60)
GFR, Est Non African American: 55 mL/min — ABNORMAL LOW (ref >=60)
GLUCOSE: 135 mg/dL — AB (ref 65–99)
Potassium: 3.8 mmol/L (ref 3.5–5.3)
SODIUM: 145 mmol/L (ref 135–146)
TOTAL PROTEIN: 6.4 g/dL (ref 6.1–8.1)

## 2016-09-02 LAB — CBC WITH DIFFERENTIAL/PLATELET
BASOS ABS: 0 10*3/uL (ref 0.0–0.1)
Basophils Relative: 0 %
EOS ABS: 0.2 10*3/uL (ref 0.0–0.7)
Eosinophils Relative: 1 %
HCT: 47.9 % (ref 39.0–52.0)
HEMOGLOBIN: 15.1 g/dL (ref 13.0–17.0)
LYMPHS PCT: 22 %
Lymphs Abs: 3.5 10*3/uL (ref 0.7–4.0)
MCH: 29.1 pg (ref 26.0–34.0)
MCHC: 31.5 g/dL (ref 30.0–36.0)
MCV: 92.3 fL (ref 78.0–100.0)
MONO ABS: 1.1 10*3/uL — AB (ref 0.1–1.0)
Monocytes Relative: 7 %
Neutro Abs: 11.2 10*3/uL — ABNORMAL HIGH (ref 1.7–7.7)
Neutrophils Relative %: 70 %
PLATELETS: 314 10*3/uL (ref 150–400)
RBC: 5.19 MIL/uL (ref 4.22–5.81)
RDW: 17.9 % — ABNORMAL HIGH (ref 11.5–15.5)
WBC: 16 10*3/uL — AB (ref 4.0–10.5)

## 2016-09-02 LAB — STREP PNEUMONIAE URINARY ANTIGEN: STREP PNEUMO URINARY ANTIGEN: NEGATIVE

## 2016-09-02 LAB — GLUCOSE, POCT (MANUAL RESULT ENTRY): POC Glucose: 173 mg/dl — AB (ref 70–99)

## 2016-09-02 LAB — I-STAT CHEM 8, ED
BUN: 26 mg/dL — ABNORMAL HIGH (ref 6–20)
BUN: 22 mg/dL — ABNORMAL HIGH (ref 6–20)
CALCIUM ION: 1.02 mmol/L — AB (ref 1.15–1.40)
CHLORIDE: 107 mmol/L (ref 101–111)
CREATININE: 1.6 mg/dL — AB (ref 0.61–1.24)
Calcium, Ion: 1.18 mmol/L (ref 1.15–1.40)
Chloride: 107 mmol/L (ref 101–111)
Creatinine, Ser: 1.4 mg/dL — ABNORMAL HIGH (ref 0.61–1.24)
Glucose, Bld: 201 mg/dL — ABNORMAL HIGH (ref 65–99)
Glucose, Bld: 179 mg/dL — ABNORMAL HIGH (ref 65–99)
HEMATOCRIT: 51 % (ref 39.0–52.0)
HEMATOCRIT: 47 % (ref 39.0–52.0)
HEMOGLOBIN: 17.3 g/dL — AB (ref 13.0–17.0)
Hemoglobin: 16 g/dL (ref 13.0–17.0)
POTASSIUM: 6.7 mmol/L — AB (ref 3.5–5.1)
POTASSIUM: 5 mmol/L (ref 3.5–5.1)
SODIUM: 141 mmol/L (ref 135–145)
Sodium: 143 mmol/L (ref 135–145)
TCO2: 31 mmol/L (ref 0–100)
TCO2: 24 mmol/L (ref 0–100)

## 2016-09-02 LAB — I-STAT TROPONIN, ED: TROPONIN I, POC: 0.05 ng/mL (ref 0.00–0.08)

## 2016-09-02 LAB — URINALYSIS, ROUTINE W REFLEX MICROSCOPIC
Bilirubin Urine: NEGATIVE
GLUCOSE, UA: NEGATIVE mg/dL
Hgb urine dipstick: NEGATIVE
Ketones, ur: NEGATIVE mg/dL
LEUKOCYTES UA: NEGATIVE
Nitrite: NEGATIVE
PROTEIN: NEGATIVE mg/dL
Specific Gravity, Urine: 1.012 (ref 1.005–1.030)
pH: 5 (ref 5.0–8.0)

## 2016-09-02 LAB — HEMOCCULT GUIAC POC 1CARD (OFFICE): Fecal Occult Blood, POC: NEGATIVE

## 2016-09-02 LAB — BASIC METABOLIC PANEL
ANION GAP: 9 (ref 5–15)
BUN: 15 mg/dL (ref 6–20)
CHLORIDE: 104 mmol/L (ref 101–111)
CO2: 26 mmol/L (ref 22–32)
Calcium: 8.8 mg/dL — ABNORMAL LOW (ref 8.9–10.3)
Creatinine, Ser: 1.49 mg/dL — ABNORMAL HIGH (ref 0.61–1.24)
GFR, EST AFRICAN AMERICAN: 57 mL/min — AB (ref >60)
GFR, EST NON AFRICAN AMERICAN: 49 mL/min — AB (ref >60)
Glucose, Bld: 192 mg/dL — ABNORMAL HIGH (ref 65–99)
POTASSIUM: 6.9 mmol/L — AB (ref 3.5–5.1)
SODIUM: 139 mmol/L (ref 135–145)

## 2016-09-02 LAB — INFLUENZA PANEL BY PCR (TYPE A & B)
INFLAPCR: NEGATIVE
INFLBPCR: NEGATIVE

## 2016-09-02 LAB — D-DIMER, QUANTITATIVE: D-Dimer, Quant: 0.45 mcg/mL FEU (ref <0.50)

## 2016-09-02 LAB — BRAIN NATRIURETIC PEPTIDE: B Natriuretic Peptide: 203.3 pg/mL — ABNORMAL HIGH (ref 0.0–100.0)

## 2016-09-02 MED ORDER — ALLOPURINOL 100 MG PO TABS
100.0000 mg | ORAL_TABLET | Freq: Every day | ORAL | Status: DC
  Administered 2016-09-03 – 2016-09-06 (×4): 100 mg via ORAL
  Filled 2016-09-02 (×5): qty 1

## 2016-09-02 MED ORDER — ALBUTEROL (5 MG/ML) CONTINUOUS INHALATION SOLN
10.0000 mg/h | INHALATION_SOLUTION | RESPIRATORY_TRACT | Status: DC
  Administered 2016-09-02: 10 mg/h via RESPIRATORY_TRACT
  Filled 2016-09-02: qty 20

## 2016-09-02 MED ORDER — INSULIN ASPART 100 UNIT/ML IV SOLN
10.0000 [IU] | Freq: Once | INTRAVENOUS | Status: AC
  Administered 2016-09-02: 10 [IU] via INTRAVENOUS
  Filled 2016-09-02: qty 0.1

## 2016-09-02 MED ORDER — CEFTRIAXONE SODIUM 1 G IJ SOLR
1.0000 g | INTRAMUSCULAR | Status: DC
  Administered 2016-09-02 – 2016-09-04 (×3): 1 g via INTRAVENOUS
  Filled 2016-09-02 (×4): qty 10

## 2016-09-02 MED ORDER — AZITHROMYCIN 250 MG PO TABS: ORAL_TABLET | ORAL | 0 refills | Status: DC

## 2016-09-02 MED ORDER — IPRATROPIUM-ALBUTEROL 0.5-2.5 (3) MG/3ML IN SOLN
3.0000 mL | RESPIRATORY_TRACT | Status: DC
  Administered 2016-09-02 – 2016-09-03 (×3): 3 mL via RESPIRATORY_TRACT
  Filled 2016-09-02 (×3): qty 3

## 2016-09-02 MED ORDER — ASPIRIN 81 MG PO CHEW
324.0000 mg | CHEWABLE_TABLET | Freq: Once | ORAL | Status: AC
  Administered 2016-09-02: 324 mg via ORAL
  Filled 2016-09-02: qty 4

## 2016-09-02 MED ORDER — VANCOMYCIN HCL 10 G IV SOLR
2000.0000 mg | Freq: Once | INTRAVENOUS | Status: AC
  Administered 2016-09-02: 2000 mg via INTRAVENOUS
  Filled 2016-09-02: qty 2000

## 2016-09-02 MED ORDER — PREDNISONE 10 MG PO TABS: ORAL_TABLET | ORAL | 0 refills | Status: DC

## 2016-09-02 MED ORDER — CARVEDILOL 12.5 MG PO TABS
12.5000 mg | ORAL_TABLET | Freq: Two times a day (BID) | ORAL | Status: DC
  Administered 2016-09-02 – 2016-09-04 (×4): 12.5 mg via ORAL
  Filled 2016-09-02 (×4): qty 1

## 2016-09-02 MED ORDER — LOSARTAN POTASSIUM 50 MG PO TABS
100.0000 mg | ORAL_TABLET | Freq: Every day | ORAL | Status: DC
  Administered 2016-09-03: 100 mg via ORAL
  Filled 2016-09-02 (×2): qty 2

## 2016-09-02 MED ORDER — SODIUM CHLORIDE 0.9 % IV SOLN
1.0000 g | Freq: Once | INTRAVENOUS | Status: AC
  Administered 2016-09-02: 1 g via INTRAVENOUS
  Filled 2016-09-02: qty 10

## 2016-09-02 MED ORDER — ATORVASTATIN CALCIUM 40 MG PO TABS
40.0000 mg | ORAL_TABLET | Freq: Every day | ORAL | Status: DC
  Administered 2016-09-03 – 2016-09-06 (×4): 40 mg via ORAL
  Filled 2016-09-02 (×5): qty 1

## 2016-09-02 MED ORDER — AMITRIPTYLINE HCL 25 MG PO TABS
50.0000 mg | ORAL_TABLET | Freq: Every day | ORAL | Status: DC
  Administered 2016-09-02 – 2016-09-03 (×2): 50 mg via ORAL
  Filled 2016-09-02 (×2): qty 2

## 2016-09-02 MED ORDER — GUAIFENESIN ER 600 MG PO TB12
600.0000 mg | ORAL_TABLET | Freq: Two times a day (BID) | ORAL | Status: DC | PRN
  Administered 2016-09-03: 600 mg via ORAL
  Filled 2016-09-02 (×2): qty 1

## 2016-09-02 MED ORDER — FAMOTIDINE 20 MG PO TABS
20.0000 mg | ORAL_TABLET | Freq: Every day | ORAL | Status: DC
  Administered 2016-09-03 – 2016-09-06 (×4): 20 mg via ORAL
  Filled 2016-09-02 (×4): qty 1

## 2016-09-02 MED ORDER — ENOXAPARIN SODIUM 40 MG/0.4ML ~~LOC~~ SOLN
40.0000 mg | SUBCUTANEOUS | Status: DC
  Administered 2016-09-02 – 2016-09-05 (×4): 40 mg via SUBCUTANEOUS
  Filled 2016-09-02 (×4): qty 0.4

## 2016-09-02 MED ORDER — PIPERACILLIN-TAZOBACTAM 3.375 G IVPB 30 MIN
3.3750 g | Freq: Once | INTRAVENOUS | Status: AC
  Administered 2016-09-02: 3.375 g via INTRAVENOUS
  Filled 2016-09-02: qty 50

## 2016-09-02 MED ORDER — SODIUM CHLORIDE 0.9 % IV SOLN
INTRAVENOUS | Status: DC
  Administered 2016-09-02: 100 mL/h via INTRAVENOUS
  Administered 2016-09-03: 07:00:00 via INTRAVENOUS

## 2016-09-02 MED ORDER — DILTIAZEM HCL ER COATED BEADS 180 MG PO CP24
180.0000 mg | ORAL_CAPSULE | Freq: Every day | ORAL | Status: DC
  Administered 2016-09-03 – 2016-09-04 (×2): 180 mg via ORAL
  Filled 2016-09-02 (×3): qty 1

## 2016-09-02 MED ORDER — FUROSEMIDE 10 MG/ML IJ SOLN
40.0000 mg | Freq: Once | INTRAMUSCULAR | Status: AC
  Administered 2016-09-02: 40 mg via INTRAVENOUS
  Filled 2016-09-02: qty 4

## 2016-09-02 MED ORDER — PREGABALIN 100 MG PO CAPS
100.0000 mg | ORAL_CAPSULE | Freq: Three times a day (TID) | ORAL | Status: DC
  Administered 2016-09-02 – 2016-09-06 (×11): 100 mg via ORAL
  Filled 2016-09-02: qty 1
  Filled 2016-09-02: qty 4
  Filled 2016-09-02 (×9): qty 1

## 2016-09-02 MED ORDER — IPRATROPIUM BROMIDE 0.02 % IN SOLN
1.5000 mg | Freq: Once | RESPIRATORY_TRACT | Status: AC
  Administered 2016-09-02: 1.5 mg via RESPIRATORY_TRACT
  Filled 2016-09-02: qty 7.5

## 2016-09-02 MED ORDER — TESTOSTERONE 20.25 MG/ACT (1.62%) TD GEL: 4.0000 "application " | Freq: Every morning | TRANSDERMAL | Status: DC

## 2016-09-02 MED ORDER — IOPAMIDOL (ISOVUE-370) INJECTION 76%
INTRAVENOUS | Status: AC
  Filled 2016-09-02: qty 100

## 2016-09-02 MED ORDER — METHYLPREDNISOLONE SODIUM SUCC 125 MG IJ SOLR
125.0000 mg | Freq: Once | INTRAMUSCULAR | Status: AC
  Administered 2016-09-02: 125 mg via INTRAVENOUS
  Filled 2016-09-02: qty 2

## 2016-09-02 MED ORDER — MAGNESIUM SULFATE 2 GM/50ML IV SOLN
2.0000 g | Freq: Once | INTRAVENOUS | Status: AC
  Administered 2016-09-02: 2 g via INTRAVENOUS
  Filled 2016-09-02: qty 50

## 2016-09-02 MED ORDER — ZOLPIDEM TARTRATE 5 MG PO TABS
10.0000 mg | ORAL_TABLET | Freq: Every evening | ORAL | Status: DC | PRN
  Administered 2016-09-03 – 2016-09-05 (×3): 10 mg via ORAL
  Filled 2016-09-02 (×3): qty 2

## 2016-09-02 MED ORDER — DEXTROSE 50 % IV SOLN
1.0000 | Freq: Once | INTRAVENOUS | Status: AC
  Administered 2016-09-02: 50 mL via INTRAVENOUS
  Filled 2016-09-02: qty 50

## 2016-09-02 MED ORDER — DEXTROSE 5 % IV SOLN
500.0000 mg | INTRAVENOUS | Status: DC
  Administered 2016-09-02: 500 mg via INTRAVENOUS
  Filled 2016-09-02 (×2): qty 500

## 2016-09-02 MED ORDER — INSULIN ASPART 100 UNIT/ML ~~LOC~~ SOLN
0.0000 [IU] | Freq: Three times a day (TID) | SUBCUTANEOUS | Status: DC
  Administered 2016-09-03: 9 [IU] via SUBCUTANEOUS
  Administered 2016-09-03 (×2): 3 [IU] via SUBCUTANEOUS
  Administered 2016-09-04: 2 [IU] via SUBCUTANEOUS
  Administered 2016-09-04: 3 [IU] via SUBCUTANEOUS
  Administered 2016-09-04: 1 [IU] via SUBCUTANEOUS
  Administered 2016-09-05: 3 [IU] via SUBCUTANEOUS
  Administered 2016-09-05: 7 [IU] via SUBCUTANEOUS
  Administered 2016-09-06: 2 [IU] via SUBCUTANEOUS

## 2016-09-02 MED ORDER — ALBUTEROL SULFATE (2.5 MG/3ML) 0.083% IN NEBU
2.5000 mg | INHALATION_SOLUTION | Freq: Once | RESPIRATORY_TRACT | Status: AC
  Administered 2016-09-02: 2.5 mg via RESPIRATORY_TRACT

## 2016-09-02 NOTE — Telephone Encounter (Signed)
Correction  CHF (congestive heart failure) Not CHG

## 2016-09-02 NOTE — ED Triage Notes (Signed)
Per EMS, patient has had chest pain and shortness of breath x two days.  Tripoding when EMS arrived.  CPAP at 90%.  Wheezing throughout.  5 albuterol.  1 Nitro.  190/114, HR 106, RR 28, LH 22G.

## 2016-09-02 NOTE — ED Notes (Signed)
Pt very diaphoretic and extremely SOB.

## 2016-09-02 NOTE — ED Notes (Signed)
Sent message to pharmacy to have all meds verified and SENT to [facility].

## 2016-09-02 NOTE — Patient Instructions (Addendum)
Marco [name] was seen today for neurologic problem and hemoptysis.  Diagnoses and all orders for this visit:  Type 2 diabetes mellitus without complication, with long-term current use of insulin (HCC) -     POCT glucose (manual entry)  Hemoptysis -     Hemoccult - 1 Card (office) -     DG Chest 2 View; Future -     albuterol (PROVENTIL) (2.5 MG/3ML) 0.083% nebulizer solution 2.5 mg; Take 3 mLs (2.5 mg total) by nebulization once. -     CBC -     COMPLETE METABOLIC PANEL WITH GFR -     D-dimer, quantitative (not at [facility]) -     azithromycin (ZITHROMAX) 250 MG tablet; Take by mouth 500 mg the first day, then 250 mg daily for 4 more days -     Brain natriuretic peptide   Stop taking aspirin for now Please go for chest x-ray Start azithromycin Use combivent  inhaler, 2 puff every 4-6 hrs for next 2 days then as needed Increase prednisone to 50 mg, 50 mg, 40 mg, 40 mg, 30 mg, 20 mg, back to 10 mg   You will be called with x-ray and labs results, if needed CT scan will be done   If bleeding or shortness of breath worsens go to ED  F/u in 2 weeks   Dr. Adrian Blackwater

## 2016-09-02 NOTE — Progress Notes (Signed)
Pt is here today for a follow up on neuropathy. Pt is having trouble breathing and pt states that he is spitting up blood.

## 2016-09-02 NOTE — Telephone Encounter (Signed)
Called patient Left VM  CXR results revealed fluid in lungs tissues  (interstial edema)  This is concerning for CHG There was no pneumonia or evidence of bronchitis  Plan: Present to ED Stop elavil No need to start azithromycin or increase prednisone dose

## 2016-09-02 NOTE — Progress Notes (Signed)
Pt taken off the BIPAP at 1735 and placed on 3L [place]

## 2016-09-02 NOTE — ED Provider Notes (Signed)
[facility] DEPT Provider Note   CSN: [id number] Arrival date & time: 09/02/16  1308     History   Chief Complaint Chief Complaint  Patient presents with  . Respiratory Distress    HPI Marco [name] is a 61 y.o. male.  61 yo M with acute sob.  Level V caveat due to respiratory distress. Going on for last 3 days associated with cough and fevers and chills. Patient already got acutely worse about an hour ago. No history of CHF. No prior history of similar presentation. Picked up by EMS there is some concern for acute pulmonary edema and was started on CPAP given a DuoNeb.   The history is provided by the patient and the EMS personnel.  Shortness of Breath  This is a new problem. The average episode lasts 3 days. The problem occurs continuously.The current episode started more than 2 days ago. The problem has been rapidly worsening. Associated symptoms include a fever, cough and chest pain. Pertinent negatives include no headaches, no vomiting, no abdominal pain and no rash. He has tried nothing for the symptoms. The treatment provided no relief. He has had no prior hospitalizations. He has had no prior ED visits. He has had no prior ICU admissions. Associated medical issues include CAD and past MI. Associated medical issues do not include pneumonia, chronic lung disease or heart failure.    Past Medical History:  Diagnosis Date  . Benign colon polyp 08/01/2013   [facility] in [place]. large base tranverse colon polyp was biopsied. polyp was benign with minimal surface hyperplastic change.  . Chronic pain   . Diabetes mellitus without complication ([place])   . Diverticulosis   . Esophageal hiatal hernia 07/29/2013   confirmed on EGD   . Esophageal stricture   . Essential hypertension   . Gastritis 07/29/2013   confirmed on EGD, bx done an negative for intestinal metaplasia, dsyplasia or H. pylori. normal gastric emptying study done 07/13/2013.  [name] GERD  (gastroesophageal reflux disease)   . Morbid obesity ([place])   . Non-obstructive CAD    a. 02/2013 Cath ([place]): nonobs dzs;  b. 09/2014 Myoview ([place]): EF 55%, no ischemia;  c. 12/2014 Echo: EF 50-55%, gr1 DD, no effusion.  [name] PAF (paroxysmal atrial fibrillation) ([place])    a. 02/2013 s/p rfca in [place], [place]-->prev on Xarelto, d/c'd 2/2 anemia, ? GIB.  [name] Rheumatoid arthritis ([place])   . Sigmoid diverticulosis 08/01/2013   confirmed on colonscopy. record scanned into chart    Patient Active Problem List   Diagnosis Date Noted  . Hemoptysis 09/02/2016  . Diabetic retinopathy ([place]) 07/22/2016  . Diabetic polyneuropathy associated with type 2 diabetes mellitus ([place]) 06/10/2016  . Myalgia and myositis 03/31/2016  . Dyspnea 02/14/2016  . Dyspnea on exertion 02/09/2016  . Abdominal bloating 02/05/2016  . Chronic gouty arthritis 11/29/2015  . Ache in joint 11/27/2015  . LBP (low back pain) 11/27/2015  . Arthralgia of multiple joints 11/27/2015  . Psoriasis 11/27/2015  . Coronary artery calcification seen on CAT scan   . Breast pain in male 11/14/2015  . Sinusitis, chronic 10/16/2015  . Insomnia 10/16/2015  . Combined fat and carbohydrate induced hyperlipemia 10/04/2015  . Adult BMI 30+ 10/04/2015  . New onset of headaches after age 33 09/20/2015  . OSA (obstructive sleep apnea) 07/12/2015  . Joint pain 06/01/2015  . Low serum testosterone level 05/18/2015  . Depression 05/16/2015  . Chronic pain   . Elevated troponin 12/24/2014  . CAD (  coronary artery disease) 12/24/2014  . PAF (paroxysmal atrial fibrillation) ([place]) 12/24/2014  . Essential hypertension 12/24/2014  . HLD (hyperlipidemia) 12/24/2014  . CKD (chronic kidney disease) 12/24/2014  . PUD (peptic ulcer disease) 12/24/2014  . DM2 (diabetes mellitus, type 2) ([place]) 12/24/2014  . Chest pain 12/23/2014    Past Surgical History:  Procedure Laterality Date  . CARDIAC CATHETERIZATION  05/2014   ablation for atrial fibrillation  .  CARDIAC CATHETERIZATION N/A 11/16/2015   Procedure: Left Heart Cath and Coronary Angiography;  Surgeon: Leonie Man, MD;  Location: [facility];  Service: Cardiovascular;  Laterality: N/A;  . COLON RESECTION  09/2013   due to large, abnormal polpy. non cancerous per patient.   . COLON SURGERY  09/2014   colon resection        Home Medications    Prior to Admission medications   Medication Sig Start Date End Date Taking? Authorizing Provider  ACCU-CHEK AVIVA PLUS test strip USE ONE STRIP TO CHECK BLOOD GLUCOSE 3 TIMES DAILY 07/30/16   Boykin Nearing, MD  ACCU-CHEK FASTCLIX LANCETS MISC 1 each by Other route 3 (three) times daily. 06/19/15   Josalyn Funches, MD  allopurinol (ZYLOPRIM) 100 MG tablet Take 1 tablet (100 mg total) by mouth daily. 10/12/15   Josalyn Funches, MD  amitriptyline (ELAVIL) 50 MG tablet Take 1 tablet (50 mg total) by mouth at bedtime. 08/19/16   Josalyn Funches, MD  ANDROGEL PUMP 20.25 MG/ACT (1.62%) GEL Apply 4 application topically every morning. 06/02/16   Historical Provider, MD  atorvastatin (LIPITOR) 40 MG tablet TAKE ONE TABLET BY MOUTH ONCE DAILY 08/12/16   Boykin Nearing, MD  azithromycin (ZITHROMAX) 250 MG tablet Take by mouth 500 mg the first day, then 250 mg daily for 4 more days 09/02/16   Boykin Nearing, MD  BD PEN NEEDLE NANO U/F 32G X 4 MM MISC USE ONE PEN NEEDLE THREE TIMES DAILY 08/25/16   Tresa Garter, MD  Blood Glucose Monitoring Suppl (ACCU-CHEK AVIVA PLUS) W/DEVICE KIT 1 Device by Does not apply route 3 (three) times daily after meals. E11.9 06/14/15   Josalyn Funches, MD  carvedilol (COREG) 12.5 MG tablet TAKE ONE TABLET BY MOUTH TWICE DAILY 08/12/16   Josalyn Funches, MD  diltiazem (DILACOR XR) 180 MG 24 hr capsule TAKE ONE CAPSULE BY MOUTH ONCE DAILY 06/02/16   Josalyn Funches, MD  EQ ASPIRIN ADULT LOW DOSE 81 MG EC tablet TAKE ONE TABLET BY MOUTH ONCE DAILY 07/11/16   Josalyn Funches, MD  glucose blood (ACCU-CHEK AVIVA PLUS) test strip 1  each by Other route 3 (three) times daily. E11.9 06/14/15   Boykin Nearing, MD  HYDROcodone-acetaminophen (NORCO/VICODIN) 5-325 MG tablet Take 1 tablet by mouth every 8 (eight) hours as needed for moderate pain. Per pain management    Historical Provider, MD  insulin aspart (NOVOLOG) 100 UNIT/ML injection Inject 14-24 Units into the skin 3 (three) times daily with meals. Reported on 09/20/2015    Historical Provider, MD  Ipratropium-Albuterol (COMBIVENT RESPIMAT) 20-100 MCG/ACT AERS respimat Inhale 1 puff into the lungs every 6 (six) hours. 02/21/16   Deneise Lever, MD  LEVEMIR FLEXTOUCH 100 UNIT/ML Pen INJECT 60 UNITS SUBCUTANEOUSLY ONCE DAILY AT  10  PM 06/16/16   Josalyn Funches, MD  lidocaine (XYLOCAINE) 5 % ointment Apply 1 application topically 4 (four) times daily as needed. Apply to feet 08/19/16   Josalyn Funches, MD  losartan (COZAAR) 100 MG tablet Take 1 tablet (100 mg total) by mouth  daily. 08/19/16   Boykin Nearing, MD  metFORMIN (GLUCOPHAGE-XR) 500 MG 24 hr tablet Take 2 tablets (1,000 mg total) by mouth 2 (two) times daily. 06/10/16   Josalyn Funches, MD  pantoprazole (PROTONIX) 40 MG tablet Take 1 tablet (40 mg total) by mouth daily. Patient not taking: Reported on 09/02/2016 02/05/16   Boykin Nearing, MD  predniSONE (DELTASONE) 10 MG tablet TAKE ONE TABLET BY MOUTH ONCE DAILY WITH BREAKFAST 08/15/16   Josalyn Funches, MD  predniSONE (DELTASONE) 10 MG tablet Take by mouth 50 mg daily for 2 days, then 40 mg daily for 2 days, then 30 mg for one day, 20 mg for one day, then back down to 10 mg daily 09/02/16 09/07/16  Boykin Nearing, MD  pregabalin (LYRICA) 100 MG capsule Take 1 capsule (100 mg total) by mouth 3 (three) times daily. 08/19/16   Josalyn Funches, MD  ranitidine (ZANTAC) 150 MG tablet TAKE ONE TABLET BY MOUTH AT BEDTIME 08/12/16   Josalyn Funches, MD  Secukinumab (COSENTYX) 150 MG/ML SOSY Inject 150 mg into the skin. 06/02/16   Historical Provider, MD  umeclidinium-vilanterol (ANORO  ELLIPTA) 62.5-25 MCG/INH AEPB Inhale 1 puff into the lungs daily. 02/15/16   Deneise Lever, MD  vitamin B-12 (CYANOCOBALAMIN) 1000 MCG tablet Take 1 tablet (1,000 mcg total) by mouth daily. 06/12/16   Josalyn Funches, MD  zolpidem (AMBIEN) 10 MG tablet Take 10 mg by mouth at bedtime as needed for sleep.    Historical Provider, MD    Family History Family History  Problem Relation Age of Onset  . Hypertension Mother   . Diabetes Mother   . Cancer Mother   . COPD Mother   . Hypertension Father   . Diabetes Father   . Heart Problems Father   . COPD Father     Social History Social History  Substance Use Topics  . Smoking status: Former Smoker    Packs/day: 0.50    Years: 10.00    Types: Cigarettes    Quit date: 08/12/2007  . Smokeless tobacco: Never Used  . Alcohol use No     Allergies   Patient has no known allergies.   Review of Systems Review of Systems  Constitutional: Positive for fever. Negative for chills.  HENT: Negative for congestion and facial swelling.   Eyes: Negative for discharge and visual disturbance.  Respiratory: Positive for cough and shortness of breath.   Cardiovascular: Positive for chest pain. Negative for palpitations.  Gastrointestinal: Negative for abdominal pain, diarrhea and vomiting.  Musculoskeletal: Negative for arthralgias and myalgias.  Skin: Negative for color change and rash.  Neurological: Negative for tremors, syncope and headaches.  Psychiatric/Behavioral: Negative for confusion and dysphoric mood.     Physical Exam Updated Vital Signs BP 127/78   Pulse 68   Resp 25   Ht _0  (1.676 m)   Wt 240 lb (108.9 kg)   SpO2 99%   BMI 38.74 kg/m   Physical Exam  Constitutional: He is oriented to person, place, and time. He appears well-developed and well-nourished.  HENT:  Head: Normocephalic and atraumatic.  No JVD   Eyes: EOM are normal. Pupils are equal, round, and reactive to light.  Neck: Normal range of motion. Neck  supple. No JVD present.  Cardiovascular: Normal rate and regular rhythm.  Exam reveals no gallop and no friction rub.   No murmur heard. Pulmonary/Chest: He is in respiratory distress. He has wheezes (diffuse). He exhibits no tenderness.  Abdominal: He exhibits no distension  and no mass. There is no tenderness. There is no rebound and no guarding.  Musculoskeletal: Normal range of motion. He exhibits no edema.  Neurological: He is alert and oriented to person, place, and time.  Skin: No rash noted. He is diaphoretic. No pallor.  Psychiatric: He has a normal mood and affect. His behavior is normal.  Nursing note and vitals reviewed.    ED Treatments / Results  Labs (all labs ordered are listed, but only abnormal results are displayed) Labs Reviewed  BRAIN NATRIURETIC PEPTIDE - Abnormal; Notable for the following:       Result Value   B Natriuretic Peptide 203.3 (*)    All other components within normal limits  CBC WITH DIFFERENTIAL/PLATELET - Abnormal; Notable for the following:    WBC 16.0 (*)    RDW 17.9 (*)    Neutro Abs 11.2 (*)    Monocytes Absolute 1.1 (*)    All other components within normal limits  BASIC METABOLIC PANEL - Abnormal; Notable for the following:    Potassium 6.9 (*)    Glucose, Bld 192 (*)    Creatinine, Ser 1.49 (*)    Calcium 8.8 (*)    GFR calc non Af Amer 49 (*)    GFR calc Af Amer 57 (*)    All other components within normal limits  I-STAT CHEM 8, ED - Abnormal; Notable for the following:    Potassium 6.7 (*)    BUN 26 (*)    Creatinine, Ser 1.60 (*)    Glucose, Bld 201 (*)    Hemoglobin 17.3 (*)    All other components within normal limits  I-STAT TROPOININ, ED    EKG  EKG Interpretation  Date/Time:  Tuesday September 02 2016 13:18:22 EST Ventricular Rate:  94 PR Interval:    QRS Duration: 111 QT Interval:  369 QTC Calculation: 462 R Axis:   45 Text Interpretation:  Sinus rhythm Low voltage, precordial leads Probable anteroseptal  infarct, old Baseline wander in lead(s) V2 No significant change since last tracing Confirmed by Lonzell Dorris MD, DANIEL [id number]) on 09/02/2016 1:56:44 PM       Radiology Dg Chest 2 View  Result Date: 09/02/2016 CLINICAL DATA:  Hemoptysis for 1 week.  Shortness of breath EXAM: CHEST  2 VIEW COMPARISON:  01/22/2016 FINDINGS: Heart is borderline in size. Interstitial prominence throughout the lungs, most pronounced in the perihilar regions and lower lobes concerning for interstitial edema. No significant effusion. No acute bony abnormality. IMPRESSION: Borderline heart size. New interstitial prominence throughout the lungs concerning for interstitial edema. Electronically Signed   By: Rolm Baptise M.D.   On: 09/02/2016 11:18   Ct Angio Chest Pe W And/or Wo Contrast  Result Date: 09/02/2016 CLINICAL DATA:  Acute onset shortness of breath, hemoptysis. EXAM: CT ANGIOGRAPHY CHEST WITH CONTRAST TECHNIQUE: Multidetector CT imaging of the chest was performed using the standard protocol during bolus administration of intravenous contrast. Multiplanar CT image reconstructions and MIPs were obtained to evaluate the vascular anatomy. CONTRAST:  75 cc Isovue 300 IV COMPARISON:  Chest x-ray earlier today.  Chest CT 10/26/2015. FINDINGS: Cardiovascular: Heart is enlarged. Aorta is normal caliber. No filling defects in the pulmonary arteries to suggest pulmonary emboli. Scattered coronary artery calcifications. Mediastinum/Nodes: Small scattered borderline sized mediastinal lymph nodes. No axillary or hilar adenopathy. Lungs/Pleura: Dense airspace disease throughout both lower lobes. Patchy airspace disease also noted within the lingula, right middle lobe and inferior right upper lobe with ground-glass opacities throughout both upper lobes.  This could represent edema or infection. Small bilateral pleural effusions. Upper Abdomen: Imaging into the upper abdomen shows no acute findings. Nonobstructing small stone in the upper pole  left kidney. Musculoskeletal: Chest wall soft tissues are unremarkable. No acute bony abnormality or focal bone lesion. Review of the MIP images confirms the above findings. IMPRESSION: S cardiomegaly, coronary artery disease. Bilateral airspace opacities diffusely throughout the lungs, most confluent in the lower lobes bilaterally. This could represent edema or infection. Borderline sized mesenteric lymph nodes, likely reactive or related to congestion. Small bilateral pleural effusions. Electronically Signed   By: Rolm Baptise M.D.   On: 09/02/2016 15:49   Dg Chest Port 1 View  Result Date: 09/02/2016 CLINICAL DATA:  Productive cough and shortness of breath. Coughing up blood. EXAM: PORTABLE CHEST 1 VIEW COMPARISON:  09/02/2016 at 1051 hours FINDINGS: Again noted are prominent interstitial lung densities, particularly in the lower lungs. There may be mild progression at the right lung base. Heart size remains upper limits of normal. Trachea is midline. There is new thickening or fluid along the right minor fissure. IMPRESSION: Prominent interstitial lung densities with increased densities at the right lung base. Findings are concerning for pulmonary edema. Electronically Signed   By: Markus Daft M.D.   On: 09/02/2016 14:14    Procedures Procedures (including critical care time)  Medications Ordered in ED Medications  albuterol (PROVENTIL,VENTOLIN) solution continuous neb (10 mg/hr Nebulization New Bag/Given 09/02/16 1330)  iopamidol (ISOVUE-370) 76 % injection (not administered)  vancomycin (VANCOCIN) 2,000 mg in sodium chloride 0.9 % 500 mL IVPB (not administered)  piperacillin-tazobactam (ZOSYN) IVPB 3.375 g (not administered)  ipratropium (ATROVENT) nebulizer solution 1.5 mg (1.5 mg Nebulization Given 09/02/16 1330)  methylPREDNISolone sodium succinate (SOLU-MEDROL) 125 mg/2 mL injection 125 mg (125 mg Intravenous Given 09/02/16 1331)  aspirin chewable tablet 324 mg (324 mg Oral Given 09/02/16  1330)  magnesium sulfate IVPB 2 g 50 mL (2 g Intravenous New Bag/Given 09/02/16 1403)  calcium gluconate 1 g in sodium chloride 0.9 % 100 mL IVPB (1 g Intravenous New Bag/Given 09/02/16 1445)  insulin aspart (novoLOG) injection 10 Units (10 Units Intravenous Given 09/02/16 1445)  dextrose 50 % solution 50 mL (50 mLs Intravenous Given 09/02/16 1445)  furosemide (LASIX) injection 40 mg (40 mg Intravenous Given 09/02/16 1446)     Initial Impression / Assessment and Plan / ED Course  I have reviewed the triage vital signs and the nursing notes.  Pertinent labs & imaging results that were available during my care of the patient were reviewed by me and considered in my medical decision making (see chart for details).     61 yo M with cough congestion fevers and shortness of breath. Going on for the past 3 days. Acutely worsening today. Patient arrived on CPAP. Initial oxygen saturation was in the 60s on CPAP. Transition to BiPAP with significant improvement. Patient clinically feeling much better. Diffuse wheezes on my exam. Will obtain a chest x-ray labs DuoNeb's magnesium Solu-Medrol and reassess.  CT scan without PE, bilateral pleural effusions vs bilateral pna.  With initial cough, will start on abx.  Admit to stepdown.  The patients results and plan were reviewed and discussed.   Any x-rays performed were independently reviewed by myself.   CRITICAL CARE Performed by: Cecilio Asper   Total critical care time: 35 minutes  Critical care time was exclusive of separately billable procedures and treating other patients.  Critical care was necessary to treat or prevent imminent  or life-threatening deterioration.  Critical care was time spent personally by me on the following activities: development of treatment plan with patient and/or surrogate as well as nursing, discussions with consultants, evaluation of patient's response to treatment, examination of patient, obtaining history from  patient or surrogate, ordering and performing treatments and interventions, ordering and review of laboratory studies, ordering and review of radiographic studies, pulse oximetry and re-evaluation of patient's condition.  Differential diagnosis were considered with the presenting HPI.  Medications  albuterol (PROVENTIL,VENTOLIN) solution continuous neb (10 mg/hr Nebulization New Bag/Given 09/02/16 1330)  iopamidol (ISOVUE-370) 76 % injection (not administered)  vancomycin (VANCOCIN) 2,000 mg in sodium chloride 0.9 % 500 mL IVPB (not administered)  piperacillin-tazobactam (ZOSYN) IVPB 3.375 g (not administered)  ipratropium (ATROVENT) nebulizer solution 1.5 mg (1.5 mg Nebulization Given 09/02/16 1330)  methylPREDNISolone sodium succinate (SOLU-MEDROL) 125 mg/2 mL injection 125 mg (125 mg Intravenous Given 09/02/16 1331)  aspirin chewable tablet 324 mg (324 mg Oral Given 09/02/16 1330)  magnesium sulfate IVPB 2 g 50 mL (2 g Intravenous New Bag/Given 09/02/16 1403)  calcium gluconate 1 g in sodium chloride 0.9 % 100 mL IVPB (1 g Intravenous New Bag/Given 09/02/16 1445)  insulin aspart (novoLOG) injection 10 Units (10 Units Intravenous Given 09/02/16 1445)  dextrose 50 % solution 50 mL (50 mLs Intravenous Given 09/02/16 1445)  furosemide (LASIX) injection 40 mg (40 mg Intravenous Given 09/02/16 1446)    Vitals:   09/02/16 1400 09/02/16 1415 09/02/16 1430 09/02/16 1548  BP: 127/87 132/93 127/95 127/78  Pulse: 72 71 71 68  Resp: _0 SpO2: 100% 98% 98% 99%  Weight:      Height:        Final diagnoses:  Acute respiratory failure with hypoxia ([place])  Community acquired pneumonia, unspecified laterality    Admission/ observation were discussed with the admitting physician, patient and/or family and they are comfortable with the plan.    Final Clinical Impressions(s) / ED Diagnoses   Final diagnoses:  Acute respiratory failure with hypoxia ([place])  Community acquired pneumonia, unspecified  laterality    New Prescriptions New Prescriptions   No medications on file     Deno Etienne, DO 09/03/16 [id number]

## 2016-09-02 NOTE — Progress Notes (Signed)
Subjective:  Patient ID: Marco [name], male    DOB: [birth year]  Age: 61 y.o. MRN: [id number]  CC: Neurologic Problem and Hemoptysis   HPI Hannan Hutmacher has diabetes, hx of chronic chest pain with minimal CAD (based on cath 11/2015), gout, psoriasis, arthralgia (on chronic prednisone 10 mg daily), chronic pain, OSA  presents for   1. Hemoptysis: started 3-4 days ago. Has congestion, cough, shortness of breath. Denies chest pain. Denies NSAID. Taking prednisone 10 mg daily. Taking aspirin 81 mg daily. He has not been using home combivent.  Also noted abdominal bloating mostly in AM over past 2 days. No abdominal pain, nausea, vomiting, diarrhea, constipation. No fever or chills. No known sick contacts. No recent hospitalizations.  He had an ECHO done in 12/24/2014: EF 36-46%, grade 1 diastolic dysfunction, moderate LVH. He had a cath on 11/16/2015 with minimal CAD.   2. Neuropathy: in hands and feet. Improved with addition of elavil and lyrica dose increase. Taking 100 mg of elavil nightly. He is sleeping better. He is less depressed. He still has trouble falling asleep. He stopped Ambien when he started elavil.    Social History  Substance Use Topics  . Smoking status: Former Smoker    Packs/day: 0.50    Years: 10.00    Types: Cigarettes    Quit date: 08/12/2007  . Smokeless tobacco: Never Used  . Alcohol use No    Outpatient Medications Prior to Visit  Medication Sig Dispense Refill  . ACCU-CHEK AVIVA PLUS test strip USE ONE STRIP TO CHECK BLOOD GLUCOSE 3 TIMES DAILY 100 each 12  . ACCU-CHEK FASTCLIX LANCETS MISC 1 each by Other route 3 (three) times daily. 102 each 11  . allopurinol (ZYLOPRIM) 100 MG tablet Take 1 tablet (100 mg total) by mouth daily. 30 tablet 1  . amitriptyline (ELAVIL) 50 MG tablet Take 1 tablet (50 mg total) by mouth at bedtime. 30 tablet 1  . ANDROGEL PUMP 20.25 MG/ACT (1.62%) GEL Apply 4 application topically every morning.  5  . atorvastatin (LIPITOR) 40 MG  tablet TAKE ONE TABLET BY MOUTH ONCE DAILY 90 tablet 0  . BD PEN NEEDLE NANO U/F 32G X 4 MM MISC USE ONE PEN NEEDLE THREE TIMES DAILY 100 each 11  . Blood Glucose Monitoring Suppl (ACCU-CHEK AVIVA PLUS) W/DEVICE KIT 1 Device by Does not apply route 3 (three) times daily after meals. E11.9 1 kit 0  . carvedilol (COREG) 12.5 MG tablet TAKE ONE TABLET BY MOUTH TWICE DAILY 180 tablet 0  . diltiazem (DILACOR XR) 180 MG 24 hr capsule TAKE ONE CAPSULE BY MOUTH ONCE DAILY 90 capsule 0  . EQ ASPIRIN ADULT LOW DOSE 81 MG EC tablet TAKE ONE TABLET BY MOUTH ONCE DAILY 90 tablet 0  . glucose blood (ACCU-CHEK AVIVA PLUS) test strip 1 each by Other route 3 (three) times daily. E11.9 100 each 12  . HYDROcodone-acetaminophen (NORCO/VICODIN) 5-325 MG tablet Take 1 tablet by mouth every 8 (eight) hours as needed for moderate pain. Per pain management    . insulin aspart (NOVOLOG) 100 UNIT/ML injection Inject 14-24 Units into the skin 3 (three) times daily with meals. Reported on 09/20/2015    . Ipratropium-Albuterol (COMBIVENT RESPIMAT) 20-100 MCG/ACT AERS respimat Inhale 1 puff into the lungs every 6 (six) hours. 4 g 12  . LEVEMIR FLEXTOUCH 100 UNIT/ML Pen INJECT 60 UNITS SUBCUTANEOUSLY ONCE DAILY AT  10  PM 15 mL 3  . lidocaine (XYLOCAINE) 5 % ointment Apply 1 application topically  4 (four) times daily as needed. Apply to feet 35.44 g 2  . losartan (COZAAR) 100 MG tablet Take 1 tablet (100 mg total) by mouth daily. 30 tablet 5  . metFORMIN (GLUCOPHAGE-XR) 500 MG 24 hr tablet Take 2 tablets (1,000 mg total) by mouth 2 (two) times daily. 120 tablet 5  . pantoprazole (PROTONIX) 40 MG tablet Take 1 tablet (40 mg total) by mouth daily. 30 tablet 5  . predniSONE (DELTASONE) 10 MG tablet TAKE ONE TABLET BY MOUTH ONCE DAILY WITH BREAKFAST 30 tablet 1  . pregabalin (LYRICA) 100 MG capsule Take 1 capsule (100 mg total) by mouth 3 (three) times daily. 90 capsule 5  . ranitidine (ZANTAC) 150 MG tablet TAKE ONE TABLET BY MOUTH AT  BEDTIME 90 tablet 0  . Secukinumab (COSENTYX) 150 MG/ML SOSY Inject 150 mg into the skin.    [name] umeclidinium-vilanterol (ANORO ELLIPTA) 62.5-25 MCG/INH AEPB Inhale 1 puff into the lungs daily. 1 each 5  . vitamin B-12 (CYANOCOBALAMIN) 1000 MCG tablet Take 1 tablet (1,000 mcg total) by mouth daily. 30 tablet 3  . zolpidem (AMBIEN) 10 MG tablet Take 10 mg by mouth at bedtime as needed for sleep.    [name] zolpidem (AMBIEN) 5 MG tablet TAKE ONE TABLET BY MOUTH ONCE DAILY AT BEDTIME AS NEEDED FOR SLEEP 30 tablet 5   No facility-administered medications prior to visit.     ROS Review of Systems  Constitutional: Positive for appetite change (early satiety ). Negative for chills, fatigue, fever and unexpected weight change.  HENT:       Dry mouth   Eyes: Negative for visual disturbance.  Respiratory: Positive for cough and shortness of breath.   Cardiovascular: Negative for chest pain, palpitations and leg swelling.  Gastrointestinal: Positive for abdominal distention. Negative for abdominal pain, anal bleeding, blood in stool, constipation, diarrhea, nausea, rectal pain and vomiting.  Endocrine: Negative for polydipsia, polyphagia and polyuria.  Musculoskeletal: Negative for arthralgias, back pain, gait problem, joint swelling, myalgias and neck pain.  Skin: Negative for rash.  Allergic/Immunologic: Negative for immunocompromised state.  Neurological: Positive for numbness (hands and feet ). Negative for dizziness, weakness and headaches.  Hematological: Negative for adenopathy. Does not bruise/bleed easily.  Psychiatric/Behavioral: Positive for dysphoric mood. Negative for sleep disturbance and suicidal ideas. The patient is not nervous/anxious.     Objective:  BP (!) 168/90 (BP Location: Left Arm, Patient Position: Sitting, Cuff Size: Small)   Pulse 74   Temp 98.1 F (36.7 C) (Oral)   Ht _0  (1.676 m)   Wt 240 lb 3.2 oz (109 kg)   SpO2 92%   BMI 38.77 kg/m   BP/Weight 09/02/2016  08/19/2016 49/20/1007  Systolic BP 121 975 883  Diastolic BP 90 80 71  Wt. (Lbs) 240.2 229.2 216.6  BMI 38.77 36.99 34.96    Physical Exam  Constitutional: He appears well-developed and well-nourished. No distress.  HENT:  Head: Normocephalic and atraumatic.  Nose: Mucosal edema present.  Mostly edentulous   Neck: Normal range of motion. Neck supple.  Cardiovascular: Normal rate, regular rhythm, normal heart sounds and intact distal pulses.   Pulses:      Radial pulses are 2+ on the right side, and 2+ on the left side.       Dorsalis pedis pulses are 2+ on the right side, and 2+ on the left side.       Posterior tibial pulses are 2+ on the right side, and 2+ on the left side.  Pulmonary/Chest: Effort normal. No respiratory distress. He has no wheezes. He has rales. He exhibits no tenderness.  Musculoskeletal: He exhibits no edema.       Right upper arm: He exhibits tenderness. He exhibits no bony tenderness, no swelling, no edema, no deformity and no laceration.       Left upper arm: He exhibits tenderness. He exhibits no bony tenderness, no swelling, no edema, no deformity and no laceration.       Right hand: He exhibits tenderness.       Left hand: He exhibits tenderness.       Right foot: There is tenderness.       Left foot: There is tenderness.       Feet:  Tenderness to light touch in hands and feet No swelling or erythema   Neurological: He is alert.  Skin: Skin is warm and dry. No rash noted. No erythema.  Psychiatric: He exhibits a depressed mood.   Lab Results  Component Value Date   HGBA1C 7.7 08/19/2016   CBG 173  Treated with albuterol nebulizer treatment 5 mg x one  Repeat exam Patient is more comfortable and breathing easire Breath sounds are less coarse  Repeat O2 level 91 % on RA  Ambulatory pulse ox 86-91%  Patient sent for CXR: X-ray reveals new interstitial prominence throughout the lungs concerning for interstitial edema No infiltrate borderline  heart size   CBC Latest Ref Rng & Units 01/22/2016 11/16/2015 09/20/2015  WBC 4.0 - 10.5 K/uL 8.1 9.5 8.8  Hemoglobin 13.0 - 17.0 g/dL 13.8 13.9 13.6  Hematocrit 39.0 - 52.0 % 42.8 44.0 41.6  Platelets 150 - 400 K/uL 297 224 233     Depression screen [facility] 2/9 09/02/2016 08/19/2016 06/10/2016  Decreased Interest 0 2 0  Down, Depressed, Hopeless _0 PHQ - 2 Score _1 Altered sleeping _2 Tired, decreased energy _3 Change in appetite 1 0 0  Feeling bad or failure about yourself  0 1 0  Trouble concentrating 1 0 0  Moving slowly or fidgety/restless 0 0 0  Suicidal thoughts 0 2 0  PHQ-9 Score _4 Some recent data might be hidden   GAD 7 : Generalized Anxiety Score 09/02/2016 08/19/2016 06/10/2016 03/31/2016  Nervous, Anxious, on Edge 0 2 0 0  Control/stop worrying 0 3 0 0  Worry too much - different things _5 0  Trouble relaxing _6 0  Restless 0 0 0 0  Easily annoyed or irritable 0 0 0 0  Afraid - awful might happen 0 2 0 0  Total GAD 7 Score _7 0     Lab Results  Component Value Date   HGBA1C 7.7 08/19/2016   Lab Results  Component Value Date   HGBA1C 7.7 08/19/2016    Assessment & Plan:   Jaryn was seen today for neurologic problem and hemoptysis.  Diagnoses and all orders for this visit:  Type 2 diabetes mellitus without complication, with long-term current use of insulin (HCC) -     POCT glucose (manual entry)  Hemoptysis -     Hemoccult - 1 Card (office) -     DG Chest 2 View; Future -     albuterol (PROVENTIL) (2.5 MG/3ML) 0.083% nebulizer solution 2.5 mg; Take 3 mLs (2.5 mg total) by nebulization once. -     CBC -     COMPLETE METABOLIC PANEL  WITH GFR -     D-dimer, quantitative (not at [facility]) -     azithromycin (ZITHROMAX) 250 MG tablet; Take by mouth 500 mg the first day, then 250 mg daily for 4 more days -     Brain natriuretic peptide -     predniSONE (DELTASONE) 10 MG tablet; Take by mouth 50 mg daily for 2 days, then 40 mg daily for  2 days, then 30 mg for one day, 20 mg for one day, then back down to 10 mg daily    No orders of the defined types were placed in this encounter.   Follow-up: Return in about 2 weeks (around 09/16/2016) for hemoptysis .   Boykin Nearing MD

## 2016-09-02 NOTE — Assessment & Plan Note (Addendum)
Hemoptysis with respiratory distress for past 4 days  Work- up is concerning for acute CHF with interstitial edema on CXR.  I am concerned that the addition of elavil may be causing a cardiomyopathy.  No evidence of bronchitis or pneumonia   Plan Patient called to go to ED for evaluation including BNP, troponin, EKG  Stop elavil No need to start azithromycin or increase prednisone dose at this time

## 2016-09-02 NOTE — H&P (Signed)
History and Physical    Marco [name] SAY:[id number] DOB: Feb 02, 1956 DOA: 09/02/2016   PCP: Minerva Ends, MD   Patient coming from:  Home  Chief Complaint: Shortness of breath   HPI: Marco [name] is a 61 y.o. male presenting with 3 day history of SOB associated with fever, chills and productive cough. Symptoms were rapidly worsening at the time of presentation. Denies headaches, nausea or vomiting, No abdominal pain or rash, Patient has no prior history of ER visits. He denies any prior history of pneumonia., chronic lung disease or heart failure. Not aware of sick contacts. No recent trips. No rashes. Denies chest pain or palpitations. He had a recent URI sx for he received steroid taper and was taking inhaler without  formal infection. No other complaints Does not smoke.   ED Course:  BP 136/84   Pulse 66   Resp 13   Ht _0  (1.676 m)   Wt 108.9 kg (240 lb)   SpO2 97%   BMI 38.74 kg/m    CT angio chest neg PE but + for Bilateral airspace opacities diffusely throughout the lungs, most confluent in the lower lobes bilaterally. No masses seen   Received IV Solumedrol 125 mg x1 and nebs and antibiotics with IV Vanc and Zosyn   Arrived on CPAP, now on .Placed on Bipap  WBC 16. Hb 17. Cr 1.4  BNP 203  Glu 179  K was 6.7 now with normalizing value  Tnless than 0.05   Cultures pending.  Influenza pending   Review of Systems: As per HPI otherwise 10 point review of systems negative.   Past Medical History:  Diagnosis Date  . Benign colon polyp 08/01/2013   [facility] in [place]. large base tranverse colon polyp was biopsied. polyp was benign with minimal surface hyperplastic change.  . Chronic pain   . Diabetes mellitus without complication ([place])   . Diverticulosis   . Esophageal hiatal hernia 07/29/2013   confirmed on EGD   . Esophageal stricture   . Essential hypertension   . Gastritis 07/29/2013   confirmed on EGD, bx done an negative for intestinal  metaplasia, dsyplasia or H. pylori. normal gastric emptying study done 07/13/2013.  [name] GERD (gastroesophageal reflux disease)   . Morbid obesity ([place])   . Non-obstructive CAD    a. 02/2013 Cath ([place]): nonobs dzs;  b. 09/2014 Myoview ([place]): EF 55%, no ischemia;  c. 12/2014 Echo: EF 50-55%, gr1 DD, no effusion.  [name] PAF (paroxysmal atrial fibrillation) ([place])    a. 02/2013 s/p rfca in [place], [place]-->prev on Xarelto, d/c'd 2/2 anemia, ? GIB.  [name] Rheumatoid arthritis ([place])   . Sigmoid diverticulosis 08/01/2013   confirmed on colonscopy. record scanned into chart    Past Surgical History:  Procedure Laterality Date  . CARDIAC CATHETERIZATION  05/2014   ablation for atrial fibrillation  . CARDIAC CATHETERIZATION N/A 11/16/2015   Procedure: Left Heart Cath and Coronary Angiography;  Surgeon: Leonie Man, MD;  Location: [facility];  Service: Cardiovascular;  Laterality: N/A;  . COLON RESECTION  09/2013   due to large, abnormal polpy. non cancerous per patient.   . COLON SURGERY  09/2014   colon resection     Social History Social History   Social History  . Marital status: Married    Spouse name: N/A  . Number of children: N/A  . Years of education: N/A   Occupational History  . Not on file.   Social  History Main Topics  . Smoking status: Former Smoker    Packs/day: 0.50    Years: 10.00    Types: Cigarettes    Quit date: 08/12/2007  . Smokeless tobacco: Never Used  . Alcohol use No  . Drug use: No  . Sexual activity: Not on file   Other Topics Concern  . Not on file   Social History Narrative   Lives alone in a one story home.  Has 8 children.  Does not work.  On disability.  Education: college.     No Known Allergies  Family History  Problem Relation Age of Onset  . Hypertension Mother   . Diabetes Mother   . Cancer Mother   . COPD Mother   . Hypertension Father   . Diabetes Father   . Heart Problems Father   . COPD Father       Prior to Admission  medications   Medication Sig Start Date End Date Taking? Authorizing Provider  ACCU-CHEK AVIVA PLUS test strip USE ONE STRIP TO CHECK BLOOD GLUCOSE 3 TIMES DAILY 07/30/16   Boykin Nearing, MD  ACCU-CHEK FASTCLIX LANCETS MISC 1 each by Other route 3 (three) times daily. 06/19/15   Josalyn Funches, MD  allopurinol (ZYLOPRIM) 100 MG tablet Take 1 tablet (100 mg total) by mouth daily. 10/12/15   Josalyn Funches, MD  amitriptyline (ELAVIL) 50 MG tablet Take 1 tablet (50 mg total) by mouth at bedtime. 08/19/16   Josalyn Funches, MD  ANDROGEL PUMP 20.25 MG/ACT (1.62%) GEL Apply 4 application topically every morning. 06/02/16   Historical Provider, MD  atorvastatin (LIPITOR) 40 MG tablet TAKE ONE TABLET BY MOUTH ONCE DAILY 08/12/16   Boykin Nearing, MD  azithromycin (ZITHROMAX) 250 MG tablet Take by mouth 500 mg the first day, then 250 mg daily for 4 more days 09/02/16   Boykin Nearing, MD  BD PEN NEEDLE NANO U/F 32G X 4 MM MISC USE ONE PEN NEEDLE THREE TIMES DAILY 08/25/16   Tresa Garter, MD  Blood Glucose Monitoring Suppl (ACCU-CHEK AVIVA PLUS) W/DEVICE KIT 1 Device by Does not apply route 3 (three) times daily after meals. E11.9 06/14/15   Josalyn Funches, MD  carvedilol (COREG) 12.5 MG tablet TAKE ONE TABLET BY MOUTH TWICE DAILY 08/12/16   Josalyn Funches, MD  diltiazem (DILACOR XR) 180 MG 24 hr capsule TAKE ONE CAPSULE BY MOUTH ONCE DAILY 06/02/16   Josalyn Funches, MD  EQ ASPIRIN ADULT LOW DOSE 81 MG EC tablet TAKE ONE TABLET BY MOUTH ONCE DAILY 07/11/16   Josalyn Funches, MD  glucose blood (ACCU-CHEK AVIVA PLUS) test strip 1 each by Other route 3 (three) times daily. E11.9 06/14/15   Boykin Nearing, MD  HYDROcodone-acetaminophen (NORCO/VICODIN) 5-325 MG tablet Take 1 tablet by mouth every 8 (eight) hours as needed for moderate pain. Per pain management    Historical Provider, MD  insulin aspart (NOVOLOG) 100 UNIT/ML injection Inject 14-24 Units into the skin 3 (three) times daily with meals. Reported on  09/20/2015    Historical Provider, MD  Ipratropium-Albuterol (COMBIVENT RESPIMAT) 20-100 MCG/ACT AERS respimat Inhale 1 puff into the lungs every 6 (six) hours. 02/21/16   Deneise Lever, MD  LEVEMIR FLEXTOUCH 100 UNIT/ML Pen INJECT 60 UNITS SUBCUTANEOUSLY ONCE DAILY AT  10  PM 06/16/16   Josalyn Funches, MD  lidocaine (XYLOCAINE) 5 % ointment Apply 1 application topically 4 (four) times daily as needed. Apply to feet 08/19/16   Josalyn Funches, MD  losartan (COZAAR) 100 MG tablet  Take 1 tablet (100 mg total) by mouth daily. 08/19/16   Josalyn Funches, MD  metFORMIN (GLUCOPHAGE-XR) 500 MG 24 hr tablet Take 2 tablets (1,000 mg total) by mouth 2 (two) times daily. 06/10/16   Josalyn Funches, MD  pantoprazole (PROTONIX) 40 MG tablet Take 1 tablet (40 mg total) by mouth daily. Patient not taking: Reported on 09/02/2016 02/05/16   Boykin Nearing, MD  predniSONE (DELTASONE) 10 MG tablet TAKE ONE TABLET BY MOUTH ONCE DAILY WITH BREAKFAST 08/15/16   Josalyn Funches, MD  predniSONE (DELTASONE) 10 MG tablet Take by mouth 50 mg daily for 2 days, then 40 mg daily for 2 days, then 30 mg for one day, 20 mg for one day, then back down to 10 mg daily 09/02/16 09/07/16  Boykin Nearing, MD  pregabalin (LYRICA) 100 MG capsule Take 1 capsule (100 mg total) by mouth 3 (three) times daily. 08/19/16   Josalyn Funches, MD  ranitidine (ZANTAC) 150 MG tablet TAKE ONE TABLET BY MOUTH AT BEDTIME 08/12/16   Josalyn Funches, MD  Secukinumab (COSENTYX) 150 MG/ML SOSY Inject 150 mg into the skin every 28 (twenty-eight) days.  06/02/16   Historical Provider, MD  umeclidinium-vilanterol (ANORO ELLIPTA) 62.5-25 MCG/INH AEPB Inhale 1 puff into the lungs daily. 02/15/16   Deneise Lever, MD  vitamin B-12 (CYANOCOBALAMIN) 1000 MCG tablet Take 1 tablet (1,000 mcg total) by mouth daily. 06/12/16   Josalyn Funches, MD  zolpidem (AMBIEN) 10 MG tablet Take 10 mg by mouth at bedtime as needed for sleep.    Historical Provider, MD    Physical Exam:  Vitals:    09/02/16 1515 09/02/16 1545 09/02/16 1548 09/02/16 1645  BP: 127/78 140/90 127/78 136/84  Pulse: 74 68 68 66  Resp:   25 13  SpO2: 97% 97% 99% 97%  Weight:      Height:       Constitutional: uncomfortable, short of breath, tolerating bipap Eyes: PERRL, lids and conjunctivae normal ENMT: Mucous membranes are moist, without exudate or lesions  Neck: normal, supple, no masses, no thyromegaly Respiratory:  + wheezing, no crackles. Mild  respiratory effort  Cardiovascular: Regular rate and rhythm, no murmurs / rubs / gallops. Trace bilateral lower  extremity edema. 2+ pedal pulses. No carotid bruits.  Abdomen: Soft, obese , non tender, No hepatosplenomegaly. Bowel sounds positive.  Musculoskeletal: no clubbing / cyanosis. Moves all extremities Skin: no jaundice, No lesions.  Neurologic: Sensation intact  Strength normal  Psychiatric:   Alert and oriented x 3. Anxious  mood.     Labs on Admission: I have personally reviewed following labs and imaging studies  CBC:  Recent Labs Lab 09/02/16 1323 09/02/16 1348 09/02/16 1630  WBC 16.0*  --   --   NEUTROABS 11.2*  --   --   HGB 15.1 17.3* 16.0  HCT 47.9 51.0 47.0  MCV 92.3  --   --   PLT 314  --   --     Basic Metabolic Panel:  Recent Labs Lab 09/02/16 1348 09/02/16 1441 09/02/16 1630  NA 143 139 141  K 6.7* 6.9* 5.0  CL 107 104 107  CO2  --  26  --   GLUCOSE 201* 192* 179*  BUN 26* 15 22*  CREATININE 1.60* 1.49* 1.40*  CALCIUM  --  8.8*  --     GFR: Estimated Creatinine Clearance: 64.1 mL/min (by C-G formula based on SCr of 1.4 mg/dL (H)).  Liver Function Tests: No results for input(s): AST, ALT, ALKPHOS,  BILITOT, PROT, ALBUMIN in the last 168 hours. No results for input(s): LIPASE, AMYLASE in the last 168 hours. No results for input(s): AMMONIA in the last 168 hours.  Coagulation Profile: No results for input(s): INR, PROTIME in the last 168 hours.  Cardiac Enzymes: No results for input(s): CKTOTAL,  CKMB, CKMBINDEX, TROPONINI in the last 168 hours.  BNP (last 3 results) No results for input(s): PROBNP in the last 8760 hours.  HbA1C: No results for input(s): HGBA1C in the last 72 hours.  CBG: No results for input(s): GLUCAP in the last 168 hours.  Lipid Profile: No results for input(s): CHOL, HDL, LDLCALC, TRIG, CHOLHDL, LDLDIRECT in the last 72 hours.  Thyroid Function Tests: No results for input(s): TSH, T4TOTAL, FREET4, T3FREE, THYROIDAB in the last 72 hours.  Anemia Panel: No results for input(s): VITAMINB12, FOLATE, FERRITIN, TIBC, IRON, RETICCTPCT in the last 72 hours.  Urine analysis:    Component Value Date/Time   COLORURINE YELLOW 11/16/2015 1211   APPEARANCEUR CLEAR 11/16/2015 1211   LABSPEC 1.008 11/16/2015 1211   PHURINE 5.0 11/16/2015 1211   GLUCOSEU NEGATIVE 11/16/2015 1211   HGBUR NEGATIVE 11/16/2015 1211   BILIRUBINUR NEGATIVE 11/16/2015 1211   BILIRUBINUR neg 05/16/2015 1051   KETONESUR NEGATIVE 11/16/2015 1211   PROTEINUR NEGATIVE 11/16/2015 1211   UROBILINOGEN 0.2 05/16/2015 1051   UROBILINOGEN 0.2 04/14/2015 1932   NITRITE NEGATIVE 11/16/2015 1211   LEUKOCYTESUR NEGATIVE 11/16/2015 1211    Sepsis Labs: _0 (procalcitonin:4,lacticidven:4) )No results found for this or any previous visit (from the past 240 hour(s)).   Radiological Exams on Admission: Dg Chest 2 View  Result Date: 09/02/2016 CLINICAL DATA:  Hemoptysis for 1 week.  Shortness of breath EXAM: CHEST  2 VIEW COMPARISON:  01/22/2016 FINDINGS: Heart is borderline in size. Interstitial prominence throughout the lungs, most pronounced in the perihilar regions and lower lobes concerning for interstitial edema. No significant effusion. No acute bony abnormality. IMPRESSION: Borderline heart size. New interstitial prominence throughout the lungs concerning for interstitial edema. Electronically Signed   By: Rolm Baptise M.D.   On: 09/02/2016 11:18   Ct Angio Chest Pe W And/or Wo  Contrast  Result Date: 09/02/2016 CLINICAL DATA:  Acute onset shortness of breath, hemoptysis. EXAM: CT ANGIOGRAPHY CHEST WITH CONTRAST TECHNIQUE: Multidetector CT imaging of the chest was performed using the standard protocol during bolus administration of intravenous contrast. Multiplanar CT image reconstructions and MIPs were obtained to evaluate the vascular anatomy. CONTRAST:  75 cc Isovue 300 IV COMPARISON:  Chest x-ray earlier today.  Chest CT 10/26/2015. FINDINGS: Cardiovascular: Heart is enlarged. Aorta is normal caliber. No filling defects in the pulmonary arteries to suggest pulmonary emboli. Scattered coronary artery calcifications. Mediastinum/Nodes: Small scattered borderline sized mediastinal lymph nodes. No axillary or hilar adenopathy. Lungs/Pleura: Dense airspace disease throughout both lower lobes. Patchy airspace disease also noted within the lingula, right middle lobe and inferior right upper lobe with ground-glass opacities throughout both upper lobes. This could represent edema or infection. Small bilateral pleural effusions. Upper Abdomen: Imaging into the upper abdomen shows no acute findings. Nonobstructing small stone in the upper pole left kidney. Musculoskeletal: Chest wall soft tissues are unremarkable. No acute bony abnormality or focal bone lesion. Review of the MIP images confirms the above findings. IMPRESSION: S cardiomegaly, coronary artery disease. Bilateral airspace opacities diffusely throughout the lungs, most confluent in the lower lobes bilaterally. This could represent edema or infection. Borderline sized mesenteric lymph nodes, likely reactive or related to congestion. Small bilateral pleural effusions.  Electronically Signed   By: Rolm Baptise M.D.   On: 09/02/2016 15:49   Dg Chest Port 1 View  Result Date: 09/02/2016 CLINICAL DATA:  Productive cough and shortness of breath. Coughing up blood. EXAM: PORTABLE CHEST 1 VIEW COMPARISON:  09/02/2016 at 1051 hours  FINDINGS: Again noted are prominent interstitial lung densities, particularly in the lower lungs. There may be mild progression at the right lung base. Heart size remains upper limits of normal. Trachea is midline. There is new thickening or fluid along the right minor fissure. IMPRESSION: Prominent interstitial lung densities with increased densities at the right lung base. Findings are concerning for pulmonary edema. Electronically Signed   By: Markus Daft M.D.   On: 09/02/2016 14:14    EKG: Independently reviewed.  Assessment/Plan Active Problems:   Dyspnea   Acute respiratory failure with hypoxia (HCC)   CAP (community acquired pneumonia)   CAD (coronary artery disease)   Essential hypertension   HLD (hyperlipidemia)   CKD (chronic kidney disease)   PUD (peptic ulcer disease)   DM2 (diabetes mellitus, type 2) (HCC)   Chronic pain   Depression   OSA (obstructive sleep apnea)   Sinusitis, chronic   Insomnia   Chronic gouty arthritis   Arthralgia of multiple joints   Acute respiratoryfailure due to  possible CAP. Patient admitted from home for treatment of the same. He continues to need Bipap at this time, although symptoms improving  CT angio chest neg PE but + for Bilateral airspace opacities diffusely throughout the lungs, most confluent in the lower lobes bilaterally. WBC 16 (steroids)  Received IV Solumedrol 125 mg x1 and nebs and antibiotics with IV Vanc and Zosyn  . Cultures pending. Influenza pending   Tele Obs stepdown  Pneumonia order set Sputum cultures IV antibiotics with Rocephin and Zithromax per protocol  Mucinex  Acute hyperkalemia Hyperkalemia protocol has been followed in the ER, she has been given IV insulin-D50  Kayexalate-albuterol treatment initial K 6.7 now 5 . EKG without acute changes. No chest pain  Continue to monitor   Type II Diabetes Current blood sugar level is 179 Lab Results  Component Value Date   HGBA1C 7.7 08/19/2016  Hold home oral  diabetic medications.  Lantus , SSI Heart healthy carb modified diet.  Hyperlipidemia Continue home statins   Hypertension BP 127/78   Pulse 68   Controlled Continue home anti-hypertensive medications   Chronic kidney disease stage 2     baseline creatinine   1.5 Current Cr 1.4 at baseline Lab Results  Component Value Date   CREATININE 1.40 (H) 09/02/2016   CREATININE 1.49 (H) 09/02/2016   CREATININE 1.60 (H) 09/02/2016   Repeat CMET in am   GERD, no acute symptoms: Continue PPI  Depression Continue home Elavil   Insomnia Continue ambien  DVT prophylaxis: Lovenox  Code Status:   Full  Family Communication:  Discussed with patient Disposition Plan: Expect patient to be discharged to home after condition improves Consults called:    None Admission status: SDU telemetry obs    [place] E, [name] [facility]   09/02/2016, 5:17 PM

## 2016-09-02 NOTE — Procedures (Signed)
Pt transported to CT on Bipap, no complications.   

## 2016-09-02 NOTE — ED Notes (Signed)
Paging Dr. Marily Memos.  Antibiotics were ordered and given way before blood cultures were ordered.

## 2016-09-03 ENCOUNTER — Inpatient Hospital Stay (HOSPITAL_COMMUNITY): Payer: Medicaid Other

## 2016-09-03 ENCOUNTER — Observation Stay (HOSPITAL_BASED_OUTPATIENT_CLINIC_OR_DEPARTMENT_OTHER): Payer: Medicaid Other

## 2016-09-03 ENCOUNTER — Observation Stay (HOSPITAL_COMMUNITY): Payer: Medicaid Other

## 2016-09-03 DIAGNOSIS — I2510 Atherosclerotic heart disease of native coronary artery without angina pectoris: Secondary | ICD-10-CM | POA: Diagnosis present

## 2016-09-03 DIAGNOSIS — I428 Other cardiomyopathies: Secondary | ICD-10-CM | POA: Diagnosis present

## 2016-09-03 DIAGNOSIS — J329 Chronic sinusitis, unspecified: Secondary | ICD-10-CM | POA: Diagnosis present

## 2016-09-03 DIAGNOSIS — J189 Pneumonia, unspecified organism: Secondary | ICD-10-CM | POA: Diagnosis not present

## 2016-09-03 DIAGNOSIS — I5021 Acute systolic (congestive) heart failure: Secondary | ICD-10-CM | POA: Diagnosis not present

## 2016-09-03 DIAGNOSIS — I509 Heart failure, unspecified: Secondary | ICD-10-CM | POA: Diagnosis not present

## 2016-09-03 DIAGNOSIS — E785 Hyperlipidemia, unspecified: Secondary | ICD-10-CM | POA: Diagnosis present

## 2016-09-03 DIAGNOSIS — I130 Hypertensive heart and chronic kidney disease with heart failure and stage 1 through stage 4 chronic kidney disease, or unspecified chronic kidney disease: Secondary | ICD-10-CM | POA: Diagnosis present

## 2016-09-03 DIAGNOSIS — F329 Major depressive disorder, single episode, unspecified: Secondary | ICD-10-CM | POA: Diagnosis present

## 2016-09-03 DIAGNOSIS — N182 Chronic kidney disease, stage 2 (mild): Secondary | ICD-10-CM | POA: Diagnosis present

## 2016-09-03 DIAGNOSIS — I5041 Acute combined systolic (congestive) and diastolic (congestive) heart failure: Secondary | ICD-10-CM | POA: Diagnosis not present

## 2016-09-03 DIAGNOSIS — R042 Hemoptysis: Secondary | ICD-10-CM | POA: Diagnosis present

## 2016-09-03 DIAGNOSIS — J9601 Acute respiratory failure with hypoxia: Secondary | ICD-10-CM | POA: Diagnosis present

## 2016-09-03 DIAGNOSIS — I5043 Acute on chronic combined systolic (congestive) and diastolic (congestive) heart failure: Secondary | ICD-10-CM | POA: Diagnosis present

## 2016-09-03 DIAGNOSIS — K279 Peptic ulcer, site unspecified, unspecified as acute or chronic, without hemorrhage or perforation: Secondary | ICD-10-CM | POA: Diagnosis present

## 2016-09-03 DIAGNOSIS — Z794 Long term (current) use of insulin: Secondary | ICD-10-CM

## 2016-09-03 DIAGNOSIS — E1122 Type 2 diabetes mellitus with diabetic chronic kidney disease: Secondary | ICD-10-CM | POA: Diagnosis present

## 2016-09-03 DIAGNOSIS — K219 Gastro-esophageal reflux disease without esophagitis: Secondary | ICD-10-CM | POA: Diagnosis present

## 2016-09-03 DIAGNOSIS — N179 Acute kidney failure, unspecified: Secondary | ICD-10-CM | POA: Diagnosis present

## 2016-09-03 DIAGNOSIS — Z7952 Long term (current) use of systemic steroids: Secondary | ICD-10-CM | POA: Diagnosis not present

## 2016-09-03 DIAGNOSIS — J9600 Acute respiratory failure, unspecified whether with hypoxia or hypercapnia: Secondary | ICD-10-CM | POA: Diagnosis present

## 2016-09-03 DIAGNOSIS — G8929 Other chronic pain: Secondary | ICD-10-CM | POA: Diagnosis present

## 2016-09-03 DIAGNOSIS — E119 Type 2 diabetes mellitus without complications: Secondary | ICD-10-CM | POA: Diagnosis not present

## 2016-09-03 DIAGNOSIS — E875 Hyperkalemia: Secondary | ICD-10-CM | POA: Diagnosis present

## 2016-09-03 DIAGNOSIS — Z79899 Other long term (current) drug therapy: Secondary | ICD-10-CM | POA: Diagnosis not present

## 2016-09-03 DIAGNOSIS — M1A9XX0 Chronic gout, unspecified, without tophus (tophi): Secondary | ICD-10-CM | POA: Diagnosis present

## 2016-09-03 DIAGNOSIS — G4700 Insomnia, unspecified: Secondary | ICD-10-CM | POA: Diagnosis present

## 2016-09-03 DIAGNOSIS — I10 Essential (primary) hypertension: Secondary | ICD-10-CM | POA: Diagnosis not present

## 2016-09-03 DIAGNOSIS — G4733 Obstructive sleep apnea (adult) (pediatric): Secondary | ICD-10-CM | POA: Diagnosis present

## 2016-09-03 DIAGNOSIS — Z87891 Personal history of nicotine dependence: Secondary | ICD-10-CM | POA: Diagnosis not present

## 2016-09-03 LAB — MRSA PCR SCREENING: MRSA by PCR: NEGATIVE

## 2016-09-03 LAB — CBC
HCT: 42.1 % (ref 39.0–52.0)
Hemoglobin: 13.3 g/dL (ref 13.0–17.0)
MCH: 28.7 pg (ref 26.0–34.0)
MCHC: 31.6 g/dL (ref 30.0–36.0)
MCV: 90.9 fL (ref 78.0–100.0)
PLATELETS: 272 10*3/uL (ref 150–400)
RBC: 4.63 MIL/uL (ref 4.22–5.81)
RDW: 17.5 % — AB (ref 11.5–15.5)
WBC: 15.5 10*3/uL — ABNORMAL HIGH (ref 4.0–10.5)

## 2016-09-03 LAB — RESPIRATORY PANEL BY PCR
ADENOVIRUS-RVPPCR: NOT DETECTED
Bordetella pertussis: NOT DETECTED
CORONAVIRUS 229E-RVPPCR: NOT DETECTED
CORONAVIRUS HKU1-RVPPCR: NOT DETECTED
CORONAVIRUS NL63-RVPPCR: NOT DETECTED
CORONAVIRUS OC43-RVPPCR: NOT DETECTED
Chlamydophila pneumoniae: NOT DETECTED
Influenza A: NOT DETECTED
Influenza B: NOT DETECTED
METAPNEUMOVIRUS-RVPPCR: NOT DETECTED
Mycoplasma pneumoniae: NOT DETECTED
PARAINFLUENZA VIRUS 1-RVPPCR: NOT DETECTED
PARAINFLUENZA VIRUS 2-RVPPCR: NOT DETECTED
Parainfluenza Virus 3: NOT DETECTED
Parainfluenza Virus 4: NOT DETECTED
Respiratory Syncytial Virus: NOT DETECTED
Rhinovirus / Enterovirus: NOT DETECTED

## 2016-09-03 LAB — GLUCOSE, CAPILLARY
GLUCOSE-CAPILLARY: 246 mg/dL — AB (ref 65–99)
GLUCOSE-CAPILLARY: 200 mg/dL — AB (ref 65–99)
Glucose-Capillary: 352 mg/dL — ABNORMAL HIGH (ref 65–99)
Glucose-Capillary: 241 mg/dL — ABNORMAL HIGH (ref 65–99)

## 2016-09-03 LAB — ECHOCARDIOGRAM COMPLETE
CHL CUP DOP CALC LVOT VTI: 18.3 cm
CHL CUP MV DEC (S): 225
E decel time: 225 msec
EERAT: 16.57
FS: 25 % — AB (ref 28–44)
Height: 66 in
IV/PV OW: 0.98
LA ID, A-P, ES: 44 mm
LA vol A4C: 64.8 ml
LA vol index: 30.4 mL/m2
LADIAMINDEX: 1.98 cm/m2
LAVOL: 67.5 mL
LEFT ATRIUM END SYS DIAM: 44 mm
LV e' LATERAL: 6.64 cm/s
LVEEAVG: 16.57
LVEEMED: 16.57
LVOT SV: 76 mL
LVOT area: 4.15 cm2
LVOT peak vel: 90.9 cm/s
LVOTD: 23 mm
Lateral S' vel: 19.7 cm/s
MV Peak grad: 5 mmHg
MVPKAVEL: 60.1 m/s
MVPKEVEL: 110 m/s
PW: 11.3 mm — AB (ref 0.6–1.1)
TAPSE: 23.4 mm
TDI e' lateral: 6.64
TDI e' medial: 5.22
Weight: 3608.49 oz

## 2016-09-03 LAB — COMPREHENSIVE METABOLIC PANEL
ALK PHOS: 36 U/L — AB (ref 38–126)
ALT: 35 U/L (ref 17–63)
AST: 24 U/L (ref 15–41)
Albumin: 3.1 g/dL — ABNORMAL LOW (ref 3.5–5.0)
Anion gap: 14 (ref 5–15)
BUN: 20 mg/dL (ref 6–20)
CALCIUM: 8.4 mg/dL — AB (ref 8.9–10.3)
CO2: 23 mmol/L (ref 22–32)
CREATININE: 1.66 mg/dL — AB (ref 0.61–1.24)
Chloride: 103 mmol/L (ref 101–111)
GFR, EST AFRICAN AMERICAN: 50 mL/min — AB (ref >60)
GFR, EST NON AFRICAN AMERICAN: 43 mL/min — AB (ref >60)
Glucose, Bld: 234 mg/dL — ABNORMAL HIGH (ref 65–99)
Potassium: 4.3 mmol/L (ref 3.5–5.1)
SODIUM: 140 mmol/L (ref 135–145)
Total Bilirubin: 0.7 mg/dL (ref 0.3–1.2)
Total Protein: 6.4 g/dL — ABNORMAL LOW (ref 6.5–8.1)

## 2016-09-03 LAB — BRAIN NATRIURETIC PEPTIDE: Brain Natriuretic Peptide: 147.8 pg/mL — ABNORMAL HIGH (ref <100)

## 2016-09-03 MED ORDER — HYDROCODONE-ACETAMINOPHEN 5-325 MG PO TABS
1.0000 | ORAL_TABLET | Freq: Three times a day (TID) | ORAL | Status: DC | PRN
  Administered 2016-09-03 – 2016-09-05 (×6): 1 via ORAL
  Filled 2016-09-03 (×6): qty 1

## 2016-09-03 MED ORDER — AZITHROMYCIN 500 MG PO TABS
500.0000 mg | ORAL_TABLET | Freq: Every evening | ORAL | Status: DC
  Administered 2016-09-03 – 2016-09-05 (×3): 500 mg via ORAL
  Filled 2016-09-03 (×3): qty 1

## 2016-09-03 MED ORDER — FUROSEMIDE 10 MG/ML IJ SOLN
40.0000 mg | Freq: Once | INTRAMUSCULAR | Status: AC
  Administered 2016-09-03: 40 mg via INTRAVENOUS
  Filled 2016-09-03: qty 4

## 2016-09-03 MED ORDER — IPRATROPIUM-ALBUTEROL 0.5-2.5 (3) MG/3ML IN SOLN
3.0000 mL | Freq: Three times a day (TID) | RESPIRATORY_TRACT | Status: DC
  Administered 2016-09-03 – 2016-09-04 (×4): 3 mL via RESPIRATORY_TRACT
  Filled 2016-09-03 (×6): qty 3

## 2016-09-03 NOTE — Progress Notes (Signed)
  Echocardiogram 2D Echocardiogram has been performed.  Marco [name] 09/03/2016, 5:18 PM

## 2016-09-03 NOTE — Progress Notes (Addendum)
PROGRESS NOTE  Marco [name] [id number] DOB: 05-05-1956 DOA: 09/02/2016 PCP: Minerva Ends, MD  HPI/Recap of past 24 hours:  Report feeling better, still have hemoptysis On 2liter o2 He report lasix was stopped a week ago  Assessment/Plan: Active Problems:   CAD (coronary artery disease)   Essential hypertension   HLD (hyperlipidemia)   CKD (chronic kidney disease)   PUD (peptic ulcer disease)   DM2 (diabetes mellitus, type 2) (HCC)   Chronic pain   Depression   OSA (obstructive sleep apnea)   Sinusitis, chronic   Insomnia   Chronic gouty arthritis   Arthralgia of multiple joints   Dyspnea   Acute respiratory failure with hypoxia (HCC)   CAP (community acquired pneumonia)    Acute respiratoryfailure due to  possible CAP and pulmonary edema, fluids overload He was sent to the ED By EMS, per ems, he was found to have diffuse bilateral wheezing and hypoxia, o2 sats in the 60's , he was put on cpap,  CT angio chest neg PE but + for Bilateral airspace opacities diffusely throughout the lungs, most confluent in the lower lobes bilaterally.    Sputum Cultures pending. , blood culture no growth, Influenza negative, urine strep antigen negative, urine legionella antigen pending, mrsa screening negative  he is admitted to stepdown  Pneumonia order set WBC 16 , Received IV Solumedrol 125 mg x1 and nebs and antibiotics with IV Vanc and Zosyn  in the ED IV antibiotics changed to Rocephin and Zithromax per protocol  Mucinex   CHF: echo with new reduced EF, troponin negative, no chest pain Most recent cardiac cath in 11/2015 with minimal cad Patient report  He was on lasix which was stopped a week ago,  He is also on ccb in addition to coreg, will consult cardiology in am  Acute hyperkalemia , k 6.9 on admission Hyperkalemia protocol has been followed in the ER, she has been given IV insulin-D50  Kayexalate-albuterol treatment initial K 6.7 now 5 . EKG without acute  changes. No chest pain  k normalized D/c cozaar  AKI on CKDII Baseline cr 1.1-1.2 in 2017 Cr 1.6 on admission ua non infection, will get renal US D/c cozaar  Insulin dependent Type II Diabetes Current blood sugar level is 179 Recent Labs       Lab Results  Component Value Date   HGBA1C 7.7 08/19/2016    Hold home oral diabetic medications.  Lantus , SSI Heart healthy carb modified diet.  Hyperlipidemia Continue home statins   Hypertension BP 127/78   Pulse 68   Controlled Continue home anti-hypertensive medications     GERD, no acute symptoms: Continue PPI  Depression Continue home Elavil   Insomnia Continue ambien  H/o rheumatoid arthritis on chronic prednisone and monoclonal antibody to IL 17  DVT prophylaxis: Lovenox  Code Status:   Full  Family Communication:  Discussed with patient Disposition Plan: Expect patient to be discharged to home after condition improves Consults called:    will call cardiology in am for newly reduced ef   Procedures:  none  Antibiotics:  Rocephin/zithro   Objective: BP (!) 147/89 (BP Location: Left Arm)   Pulse 77   Temp 98.1 F (36.7 C) (Oral)   Resp 12   Ht 5\' 6"  (1.676 m)   Wt 102.3 kg (225 lb 8.5 oz)   SpO2 93%   BMI 36.40 kg/m   Intake/Output Summary (Last 24 hours) at 09/03/16 0830 Last data filed at 09/03/16 [phone]  Gross per 24 hour  Intake              960 ml  Output              250 ml  Net              710 ml   Filed Weights   09/02/16 1314 09/03/16 0551  Weight: 108.9 kg (240 lb) 102.3 kg (225 lb 8.5 oz)    Exam:   General:  NAD  Cardiovascular: RRR  Respiratory: diminished at basis, no wheezing  Abdomen: Soft/ND/NT, positive BS  Musculoskeletal: trace pitting  Edema bilateral lower extremity  Neuro: aaox3  Data Reviewed: Basic Metabolic Panel:  Recent Labs Lab 09/02/16 1012 09/02/16 1348 09/02/16 1441 09/02/16 1630 09/03/16 0227  NA 145 143 139 141 140  K  3.8 6.7* 6.9* 5.0 4.3  CL 108 107 104 107 103  CO2 25  --  26  --  23  GLUCOSE 135* 201* 192* 179* 234*  BUN 18 26* 15 22* 20  CREATININE 1.38* 1.60* 1.49* 1.40* 1.66*  CALCIUM 8.8  --  8.8*  --  8.4*   Liver Function Tests:  Recent Labs Lab 09/02/16 1012 09/03/16 0227  AST 11 24  ALT 22 35  ALKPHOS 33* 36*  BILITOT 0.4 0.7  PROT 6.4 6.4*  ALBUMIN 3.3* 3.1*   No results for input(s): LIPASE, AMYLASE in the last 168 hours. No results for input(s): AMMONIA in the last 168 hours. CBC:  Recent Labs Lab 09/02/16 1012 09/02/16 1323 09/02/16 1348 09/02/16 1630 09/03/16 0227  WBC 10.0 16.0*  --   --  15.5*  NEUTROABS  --  11.2*  --   --   --   HGB 13.5 15.1 17.3* 16.0 13.3  HCT 41.7 47.9 51.0 47.0 42.1  MCV 89.3 92.3  --   --  90.9  PLT 263 314  --   --  272   Cardiac Enzymes:   No results for input(s): CKTOTAL, CKMB, CKMBINDEX, TROPONINI in the last 168 hours. BNP (last 3 results)  Recent Labs  09/20/15 1137 09/02/16 1323  BNP 33.3 203.3*    ProBNP (last 3 results) No results for input(s): PROBNP in the last 8760 hours.  CBG: No results for input(s): GLUCAP in the last 168 hours.  No results found for this or any previous visit (from the past 240 hour(s)).   Studies: Dg Chest 2 View  Result Date: 09/02/2016 CLINICAL DATA:  Hemoptysis for 1 week.  Shortness of breath EXAM: CHEST  2 VIEW COMPARISON:  01/22/2016 FINDINGS: Heart is borderline in size. Interstitial prominence throughout the lungs, most pronounced in the perihilar regions and lower lobes concerning for interstitial edema. No significant effusion. No acute bony abnormality. IMPRESSION: Borderline heart size. New interstitial prominence throughout the lungs concerning for interstitial edema. Electronically Signed   By: Rolm Baptise M.D.   On: 09/02/2016 11:18   Ct Angio Chest Pe W And/or Wo Contrast  Result Date: 09/02/2016 CLINICAL DATA:  Acute onset shortness of breath, hemoptysis. EXAM: CT  ANGIOGRAPHY CHEST WITH CONTRAST TECHNIQUE: Multidetector CT imaging of the chest was performed using the standard protocol during bolus administration of intravenous contrast. Multiplanar CT image reconstructions and MIPs were obtained to evaluate the vascular anatomy. CONTRAST:  75 cc Isovue 300 IV COMPARISON:  Chest x-ray earlier today.  Chest CT 10/26/2015. FINDINGS: Cardiovascular: Heart is enlarged. Aorta is normal caliber. No filling defects in the pulmonary arteries to suggest  pulmonary emboli. Scattered coronary artery calcifications. Mediastinum/Nodes: Small scattered borderline sized mediastinal lymph nodes. No axillary or hilar adenopathy. Lungs/Pleura: Dense airspace disease throughout both lower lobes. Patchy airspace disease also noted within the lingula, right middle lobe and inferior right upper lobe with ground-glass opacities throughout both upper lobes. This could represent edema or infection. Small bilateral pleural effusions. Upper Abdomen: Imaging into the upper abdomen shows no acute findings. Nonobstructing small stone in the upper pole left kidney. Musculoskeletal: Chest wall soft tissues are unremarkable. No acute bony abnormality or focal bone lesion. Review of the MIP images confirms the above findings. IMPRESSION: S cardiomegaly, coronary artery disease. Bilateral airspace opacities diffusely throughout the lungs, most confluent in the lower lobes bilaterally. This could represent edema or infection. Borderline sized mesenteric lymph nodes, likely reactive or related to congestion. Small bilateral pleural effusions. Electronically Signed   By: Rolm Baptise M.D.   On: 09/02/2016 15:49   Dg Chest Port 1 View  Result Date: 09/02/2016 CLINICAL DATA:  Productive cough and shortness of breath. Coughing up blood. EXAM: PORTABLE CHEST 1 VIEW COMPARISON:  09/02/2016 at 1051 hours FINDINGS: Again noted are prominent interstitial lung densities, particularly in the lower lungs. There may be  mild progression at the right lung base. Heart size remains upper limits of normal. Trachea is midline. There is new thickening or fluid along the right minor fissure. IMPRESSION: Prominent interstitial lung densities with increased densities at the right lung base. Findings are concerning for pulmonary edema. Electronically Signed   By: Markus Daft M.D.   On: 09/02/2016 14:14    Scheduled Meds: . allopurinol  100 mg Oral Daily  . amitriptyline  50 mg Oral QHS  . atorvastatin  40 mg Oral Daily  . azithromycin  500 mg Intravenous Q24H  . carvedilol  12.5 mg Oral BID  . cefTRIAXone (ROCEPHIN)  IV  1 g Intravenous Q24H  . diltiazem  180 mg Oral Daily  . enoxaparin (LOVENOX) injection  40 mg Subcutaneous Q24H  . famotidine  20 mg Oral Daily  . insulin aspart  0-9 Units Subcutaneous TID WC  . ipratropium-albuterol  3 mL Nebulization Q4H  . losartan  100 mg Oral Daily  . pregabalin  100 mg Oral TID  . Testosterone  4 application Topical q morning - 10a    Continuous Infusions: . sodium chloride 100 mL/hr at 09/03/16 [id number]  . albuterol Stopped (09/02/16 1500)     Time spent: 91mins  Marisue Canion MD, PhD  [facility] Pager [phone]. If 7PM-7AM, please contact night-coverage at [email or web address], password [facility] 09/03/2016, 8:30 AM  LOS: 0 days

## 2016-09-03 NOTE — Progress Notes (Signed)
CPAP per home regiman

## 2016-09-03 NOTE — ED Notes (Signed)
Patient is stable and ready to be transport to the floor at this time.  Report was called to 4E RN.  Belongings taken with the patient to the floor.   

## 2016-09-03 NOTE — Progress Notes (Addendum)
Inpatient Diabetes Program Recommendations  AACE/ADA: New Consensus Statement on Inpatient Glycemic Control (2015)  Target Ranges:  Prepandial:   less than 140 mg/dL      Peak postprandial:   less than 180 mg/dL (1-2 hours)      Critically ill patients:  140 - 180 mg/dL   Lab Results  Component Value Date   GLUCAP 352 (H) 09/03/2016   HGBA1C 7.7 08/19/2016    Review of Glycemic Control  Diabetes history: DM2, Obesity, Steroids Outpatient Diabetes medications:     Levemir 60 units QHS,     Novolog 14-24 units TIDAC ,     Metformin 1000 mg BID    (*this RN verified home DM medications with patient*)  Current orders for Inpatient glycemic control:     Novolog 0-9 units [facility]  Inpatient Diabetes Program Recommendations, please consider:    Levemir 30 units QHS    Meal coverage of Novolog 7 units TIDAC if patient eats > 50% of meal   Text page to MD sent.    Thank you,  Windy Carina, RN, MSN Diabetes Coordinator Inpatient Diabetes Program [phone] (Team Pager)

## 2016-09-03 NOTE — Progress Notes (Signed)
Pt wearing cpap of 8 with 4lpm oxygen.  sats 93% with resp rate of 18, hr 77.  Pt resting comfortably

## 2016-09-04 DIAGNOSIS — I5041 Acute combined systolic (congestive) and diastolic (congestive) heart failure: Secondary | ICD-10-CM

## 2016-09-04 DIAGNOSIS — I2510 Atherosclerotic heart disease of native coronary artery without angina pectoris: Secondary | ICD-10-CM

## 2016-09-04 DIAGNOSIS — I5021 Acute systolic (congestive) heart failure: Secondary | ICD-10-CM

## 2016-09-04 LAB — GLUCOSE, CAPILLARY
GLUCOSE-CAPILLARY: 139 mg/dL — AB (ref 65–99)
GLUCOSE-CAPILLARY: 351 mg/dL — AB (ref 65–99)
Glucose-Capillary: 252 mg/dL — ABNORMAL HIGH (ref 65–99)
Glucose-Capillary: 169 mg/dL — ABNORMAL HIGH (ref 65–99)
Glucose-Capillary: 207 mg/dL — ABNORMAL HIGH (ref 65–99)

## 2016-09-04 LAB — BASIC METABOLIC PANEL
Anion gap: 8 (ref 5–15)
BUN: 24 mg/dL — AB (ref 6–20)
CO2: 28 mmol/L (ref 22–32)
CREATININE: 1.41 mg/dL — AB (ref 0.61–1.24)
Calcium: 8.3 mg/dL — ABNORMAL LOW (ref 8.9–10.3)
Chloride: 105 mmol/L (ref 101–111)
GFR calc Af Amer: >60 mL/min (ref >60)
GFR calc non Af Amer: 52 mL/min — ABNORMAL LOW (ref >60)
Glucose, Bld: 180 mg/dL — ABNORMAL HIGH (ref 65–99)
Potassium: 4 mmol/L (ref 3.5–5.1)
SODIUM: 141 mmol/L (ref 135–145)

## 2016-09-04 LAB — CBC WITH DIFFERENTIAL/PLATELET
Basophils Absolute: 0 10*3/uL (ref 0.0–0.1)
Basophils Relative: 0 %
Eosinophils Absolute: 0 10*3/uL (ref 0.0–0.7)
Eosinophils Relative: 0 %
HCT: 41 % (ref 39.0–52.0)
Hemoglobin: 12.9 g/dL — ABNORMAL LOW (ref 13.0–17.0)
Lymphocytes Relative: 13 %
Lymphs Abs: 2.5 10*3/uL (ref 0.7–4.0)
MCH: 28.5 pg (ref 26.0–34.0)
MCHC: 31.5 g/dL (ref 30.0–36.0)
MCV: 90.7 fL (ref 78.0–100.0)
Monocytes Absolute: 1.4 10*3/uL — ABNORMAL HIGH (ref 0.1–1.0)
Monocytes Relative: 7 %
Neutro Abs: 15.4 10*3/uL — ABNORMAL HIGH (ref 1.7–7.7)
Neutrophils Relative %: 80 %
Platelets: 251 10*3/uL (ref 150–400)
RBC: 4.52 MIL/uL (ref 4.22–5.81)
RDW: 17.9 % — ABNORMAL HIGH (ref 11.5–15.5)
WBC: 19.3 10*3/uL — ABNORMAL HIGH (ref 4.0–10.5)

## 2016-09-04 LAB — URINE CULTURE: Culture: NO GROWTH

## 2016-09-04 LAB — HEMOGLOBIN A1C
Hgb A1c MFr Bld: 7.5 % — ABNORMAL HIGH (ref 4.8–5.6)
Mean Plasma Glucose: 169 mg/dL

## 2016-09-04 LAB — LEGIONELLA PNEUMOPHILA SEROGP 1 UR AG: L. PNEUMOPHILA SEROGP 1 UR AG: NEGATIVE

## 2016-09-04 MED ORDER — INSULIN ASPART 100 UNIT/ML ~~LOC~~ SOLN
5.0000 [IU] | Freq: Once | SUBCUTANEOUS | Status: AC
  Administered 2016-09-04: 5 [IU] via SUBCUTANEOUS

## 2016-09-04 MED ORDER — LOSARTAN POTASSIUM 50 MG PO TABS
50.0000 mg | ORAL_TABLET | Freq: Every day | ORAL | Status: DC
  Administered 2016-09-04 – 2016-09-06 (×3): 50 mg via ORAL
  Filled 2016-09-04 (×3): qty 1

## 2016-09-04 MED ORDER — PREDNISONE 10 MG PO TABS
10.0000 mg | ORAL_TABLET | Freq: Every day | ORAL | Status: DC
  Administered 2016-09-04 – 2016-09-06 (×3): 10 mg via ORAL
  Filled 2016-09-04 (×3): qty 1

## 2016-09-04 MED ORDER — GUAIFENESIN ER 600 MG PO TB12
600.0000 mg | ORAL_TABLET | Freq: Two times a day (BID) | ORAL | Status: DC
  Administered 2016-09-04 – 2016-09-06 (×5): 600 mg via ORAL
  Filled 2016-09-04 (×4): qty 1

## 2016-09-04 MED ORDER — FUROSEMIDE 10 MG/ML IJ SOLN: 80.0000 mg | Freq: Two times a day (BID) | INTRAMUSCULAR | Status: DC

## 2016-09-04 MED ORDER — FUROSEMIDE 10 MG/ML IJ SOLN
80.0000 mg | Freq: Once | INTRAMUSCULAR | Status: DC
  Filled 2016-09-04: qty 8

## 2016-09-04 MED ORDER — FUROSEMIDE 10 MG/ML IJ SOLN
40.0000 mg | Freq: Every day | INTRAMUSCULAR | Status: DC
  Administered 2016-09-04: 40 mg via INTRAVENOUS
  Filled 2016-09-04: qty 4

## 2016-09-04 MED ORDER — CARVEDILOL 25 MG PO TABS
25.0000 mg | ORAL_TABLET | Freq: Two times a day (BID) | ORAL | Status: DC
  Administered 2016-09-04 – 2016-09-06 (×4): 25 mg via ORAL
  Filled 2016-09-04 (×4): qty 1

## 2016-09-04 MED ORDER — ISOSORB DINITRATE-HYDRALAZINE 20-37.5 MG PO TABS
1.0000 | ORAL_TABLET | Freq: Three times a day (TID) | ORAL | Status: DC
  Administered 2016-09-04 – 2016-09-06 (×6): 1 via ORAL
  Filled 2016-09-04 (×6): qty 1

## 2016-09-04 MED ORDER — FUROSEMIDE 10 MG/ML IJ SOLN
80.0000 mg | Freq: Two times a day (BID) | INTRAMUSCULAR | Status: DC
  Administered 2016-09-04 – 2016-09-06 (×4): 80 mg via INTRAVENOUS
  Filled 2016-09-04 (×3): qty 8

## 2016-09-04 NOTE — Consult Note (Signed)
Cardiology Consult    Patient ID: Marco [name] MRN: [id number], DOB/AGE: 1956-03-28   Admit date: 09/02/2016 Date of Consult: 09/04/2016  Primary Physician: Minerva Ends, MD Reason for Consult: CHF Primary Cardiologist: [name] Requesting Provider: Dr. Erlinda Hong  Patient Profile    Marco [name] is a 61 year old male with a past medical history of DM, HTN, non-obstructive CAD and PAF. He presented to the ED on 09/02/16 with acute on chronic systolic CHF.   History of Present Illness    Marco [name] tells me that he has not felt well for about a week now. He does not weight himself but can tell that he has gained weight and his stomach has become very tight. He could not walk through his house without having to stop to catch his breath. Couldn't take a shower without SOB.   He saw his PCP at the beginning of the month and he was taken off his lasix. Review of notes from that office visit shows that lasix was stopped as he did not have any peripheral edema. His weight that visit (08/19/16) was 229 pounds. His admission weight is 240 lbs. He was also started on amitriptyline for diabetic neuropathy that visit.   His Echo from May 2016 showed an LVEF of 50-55% and grade 1 diastolic dysfunction. He had a left heart cath on 11/16/15 for exertional chest pain and SOB, his LVEDP was 5 at that time (he was on 40mg  daily Lasix). His LVEF by cath was 55-65%, and he had essentially normal coronary angiography.   Echo done this admission shows an EF of 45-50%, diffuse hypokinesis. EKG shows NSR with low voltage QRS in the precordial leads.  Chest x ray shows pulmonary edema vs. PNA. He has gotten 40mg  lasix IV daily for 3 days now.   Past Medical History   Past Medical History:  Diagnosis Date  . Benign colon polyp 08/01/2013   [facility] in [place]. large base tranverse colon polyp was biopsied. polyp was benign with minimal surface hyperplastic change.  . Chronic pain   . Diabetes  mellitus without complication ([place])   . Diverticulosis   . Esophageal hiatal hernia 07/29/2013   confirmed on EGD   . Esophageal stricture   . Essential hypertension   . Gastritis 07/29/2013   confirmed on EGD, bx done an negative for intestinal metaplasia, dsyplasia or H. pylori. normal gastric emptying study done 07/13/2013.  [name] GERD (gastroesophageal reflux disease)   . Morbid obesity ([place])   . Non-obstructive CAD    a. 02/2013 Cath ([place]): nonobs dzs;  b. 09/2014 Myoview ([place]): EF 55%, no ischemia;  c. 12/2014 Echo: EF 50-55%, gr1 DD, no effusion.  [name] PAF (paroxysmal atrial fibrillation) ([place])    a. 02/2013 s/p rfca in [place], [place]-->prev on Xarelto, d/c'd 2/2 anemia, ? GIB.  [name] Rheumatoid arthritis ([place])   . Sigmoid diverticulosis 08/01/2013   confirmed on colonscopy. record scanned into chart    Past Surgical History:  Procedure Laterality Date  . CARDIAC CATHETERIZATION  05/2014   ablation for atrial fibrillation  . CARDIAC CATHETERIZATION N/A 11/16/2015   Procedure: Left Heart Cath and Coronary Angiography;  Surgeon: Leonie Man, MD;  Location: [facility];  Service: Cardiovascular;  Laterality: N/A;  . COLON RESECTION  09/2013   due to large, abnormal polpy. non cancerous per patient.   . COLON SURGERY  09/2014   colon resection      Allergies  No Known Allergies  Inpatient Medications    . allopurinol  100 mg Oral Daily  . atorvastatin  40 mg Oral Daily  . azithromycin  500 mg Oral QPM  . carvedilol  12.5 mg Oral BID  . cefTRIAXone (ROCEPHIN)  IV  1 g Intravenous Q24H  . diltiazem  180 mg Oral Daily  . enoxaparin (LOVENOX) injection  40 mg Subcutaneous Q24H  . famotidine  20 mg Oral Daily  . furosemide  40 mg Intravenous Daily  . guaiFENesin  600 mg Oral BID  . insulin aspart  0-9 Units Subcutaneous TID WC  . ipratropium-albuterol  3 mL Nebulization TID  . predniSONE  10 mg Oral Q breakfast  . pregabalin  100 mg Oral TID    Family History    Family  History  Problem Relation Age of Onset  . Hypertension Mother   . Diabetes Mother   . Cancer Mother   . COPD Mother   . Hypertension Father   . Diabetes Father   . Heart Problems Father   . COPD Father     Social History    Social History   Social History  . Marital status: Married    Spouse name: N/A  . Number of children: N/A  . Years of education: N/A   Occupational History  . Not on file.   Social History Main Topics  . Smoking status: Former Smoker    Packs/day: 0.50    Years: 10.00    Types: Cigarettes    Quit date: 08/12/2007  . Smokeless tobacco: Never Used  . Alcohol use No  . Drug use: No  . Sexual activity: Not on file   Other Topics Concern  . Not on file   Social History Narrative   Lives alone in a one story home.  Has 8 children.  Does not work.  On disability.  Education: college.     Review of Systems    General:  No chills, fever, night sweats or weight changes.  Cardiovascular:  No chest pain, dyspnea on exertion, edema, orthopnea, palpitations, paroxysmal nocturnal dyspnea. Dermatological: No rash, lesions/masses Respiratory: No cough, dyspnea Urologic: No hematuria, dysuria Abdominal:   No nausea, vomiting, diarrhea, bright red blood per rectum, melena, or hematemesis Neurologic:  No visual changes, wkns, changes in mental status. All other systems reviewed and are otherwise negative except as noted above.  Physical Exam    Blood pressure (!) 169/96, pulse 77, temperature 97.9 F (36.6 C), temperature source Oral, resp. rate (!) 25, height 5\' 6"  (1.676 m), weight 225 lb 8.5 oz (102.3 kg), SpO2 95 %.  General: Pleasant, NAD Psych: Normal affect. Neuro: Alert and oriented X 3. Moves all extremities spontaneously. HEENT: Normal  Neck: Supple without bruits, JVD to jaw. Lungs:  Resp regular and unlabored, expiratory wheezing in upper lobes  Heart: RRR no s3, s4, or murmurs. Abdomen: Soft, non-tender, non-distended, BS + x 4.    Extremities: No clubbing, cyanosis. 1+ pretibial edema. DP/PT/Radials 2+ and equal bilaterally.  Labs    Troponin [facility] Test)  Recent Labs  09/02/16 1346  TROPIPOC 0.05   Lab Results  Component Value Date   WBC 19.3 (H) 09/04/2016   HGB 12.9 (L) 09/04/2016   HCT 41.0 09/04/2016   MCV 90.7 09/04/2016   PLT 251 09/04/2016    Recent Labs Lab 09/03/16 0227 09/04/16 0231  NA 140 141  K 4.3 4.0  CL 103 105  CO2 23 28  BUN  20 24*  CREATININE 1.66* 1.41*  CALCIUM 8.4* 8.3*  PROT 6.4*  --   BILITOT 0.7  --   ALKPHOS 36*  --   ALT 35  --   AST 24  --   GLUCOSE 234* 180*   Lab Results  Component Value Date   CHOL 92 (L) 06/10/2016   HDL 35 (L) 06/10/2016   LDLCALC 39 06/10/2016   TRIG 91 06/10/2016   Lab Results  Component Value Date   DDIMER 0.45 09/02/2016     Radiology Studies    Dg Chest 2 View  Result Date: 09/03/2016 CLINICAL DATA:  Community acquired pneumonia, shortness of breath, midsternal chest pain, diabetes mellitus, hypertension, nonobstructive coronary artery disease, rheumatoid Arthritis EXAM: CHEST  2 VIEW COMPARISON:  09/02/2016 FINDINGS: Minimal enlargement of cardiac silhouette with pulmonary vascular congestion. Mediastinal contours normal. Bronchitic changes with persistent interstitial prominence at the mid to lower lungs, greatest at RIGHT lung base, question pulmonary edema versus infection. Aeration at the bases appears improved since the previous exam. No gross pleural effusion or pneumothorax. Scattered endplate spur formation thoracic spine. IMPRESSION: Enlargement of cardiac silhouette with pulmonary vascular congestion and mid to basilar infiltrates bilaterally greatest at RIGHT lung base, question pulmonary edema versus pneumonia. Improved aeration at lung bases since prior study. Electronically Signed   By: Lavonia Dana M.D.   On: 09/03/2016 09:09   Dg Chest 2 View  Result Date: 09/02/2016 CLINICAL DATA:  Hemoptysis for 1 week.   Shortness of breath EXAM: CHEST  2 VIEW COMPARISON:  01/22/2016 FINDINGS: Heart is borderline in size. Interstitial prominence throughout the lungs, most pronounced in the perihilar regions and lower lobes concerning for interstitial edema. No significant effusion. No acute bony abnormality. IMPRESSION: Borderline heart size. New interstitial prominence throughout the lungs concerning for interstitial edema. Electronically Signed   By: Rolm Baptise M.D.   On: 09/02/2016 11:18   Ct Angio Chest Pe W And/or Wo Contrast  Result Date: 09/02/2016 CLINICAL DATA:  Acute onset shortness of breath, hemoptysis. EXAM: CT ANGIOGRAPHY CHEST WITH CONTRAST TECHNIQUE: Multidetector CT imaging of the chest was performed using the standard protocol during bolus administration of intravenous contrast. Multiplanar CT image reconstructions and MIPs were obtained to evaluate the vascular anatomy. CONTRAST:  75 cc Isovue 300 IV COMPARISON:  Chest x-ray earlier today.  Chest CT 10/26/2015. FINDINGS: Cardiovascular: Heart is enlarged. Aorta is normal caliber. No filling defects in the pulmonary arteries to suggest pulmonary emboli. Scattered coronary artery calcifications. Mediastinum/Nodes: Small scattered borderline sized mediastinal lymph nodes. No axillary or hilar adenopathy. Lungs/Pleura: Dense airspace disease throughout both lower lobes. Patchy airspace disease also noted within the lingula, right middle lobe and inferior right upper lobe with ground-glass opacities throughout both upper lobes. This could represent edema or infection. Small bilateral pleural effusions. Upper Abdomen: Imaging into the upper abdomen shows no acute findings. Nonobstructing small stone in the upper pole left kidney. Musculoskeletal: Chest wall soft tissues are unremarkable. No acute bony abnormality or focal bone lesion. Review of the MIP images confirms the above findings. IMPRESSION: S cardiomegaly, coronary artery disease. Bilateral airspace  opacities diffusely throughout the lungs, most confluent in the lower lobes bilaterally. This could represent edema or infection. Borderline sized mesenteric lymph nodes, likely reactive or related to congestion. Small bilateral pleural effusions. Electronically Signed   By: Rolm Baptise M.D.   On: 09/02/2016 15:49   US Renal  Result Date: 09/03/2016 CLINICAL DATA:  Elevated creatinine. EXAM: RENAL / URINARY TRACT  ULTRASOUND COMPLETE COMPARISON:  None. FINDINGS: Right Kidney: Length: 13.4 cm. A cortical thinning with increased renal cortical echogenicity noted. No hydronephrosis. 2.1 cm lesion identified upper pole. Left Kidney: Length: 13.0 cm. Increased renal cortical echogenicity with associated cortical thinning. No hydronephrosis. 15 mm hypoechoic lesion identified interpolar region. Bladder: Appears normal for degree of bladder distention. IMPRESSION: 1. No hydronephrosis. 2. Increased renal cortical echogenicity with thinning, compatible with medical renal disease. 3. Bilateral hypoechoic renal lesions, incompletely characterized. CT or MRI may prove helpful to further evaluate, as clinically warranted. Electronically Signed   By: Misty Stanley M.D.   On: 09/03/2016 23:00   Dg Chest Port 1 View  Result Date: 09/02/2016 CLINICAL DATA:  Productive cough and shortness of breath. Coughing up blood. EXAM: PORTABLE CHEST 1 VIEW COMPARISON:  09/02/2016 at 1051 hours FINDINGS: Again noted are prominent interstitial lung densities, particularly in the lower lungs. There may be mild progression at the right lung base. Heart size remains upper limits of normal. Trachea is midline. There is new thickening or fluid along the right minor fissure. IMPRESSION: Prominent interstitial lung densities with increased densities at the right lung base. Findings are concerning for pulmonary edema. Electronically Signed   By: Markus Daft M.D.   On: 09/02/2016 14:14    EKG & Cardiac Imaging    EKG: NSR  Echocardiogram:  09/03/16 Study Conclusions  - Left ventricle: The cavity size was normal. Wall thickness was   increased in a pattern of mild LVH. Systolic function was mildly   reduced. The estimated ejection fraction was in the range of 45%   to 50%. Diffuse hypokinesis. Doppler parameters are consistent   with pseudonormal left ventricular relaxation (grade 2 diastolic   dysfunction). The E/e&' ratio is >15, suggesting elevated LV   filling pressure. - Left atrium: The atrium was at the upper limits of normal in   size. - Right atrium: The atrium was mildly dilated. - Inferior vena cava: The vessel was normal in size. The   respirophasic diameter changes were in the normal range (>= 50%),   consistent with normal central venous pressure.  Impressions:  - Compared to a prior study in 2016, the LVEF is reduced to 45-50%   with global hypokinesis, grade 2 DD and elevated LV filling   pressure.  Assessment & Plan    1. Acute on chronic systolic CHF: Remains volume overloaded on exam. Would increase diuresis to 80mg  IV BID. Increase Coreg to 25mg  BID.   Would also add bidil for increased afterload reduction. He is hypertensive with SBP in the 150-160's.   Would stop long acting diltiazem with reduced EF and acute decompensation.   ARB was stopped yesterday due to renal insufficiency, will add back at half dose, will start losartan 50mg  daily.   2. CAP: On antibiotics per primary team  3. HTN: See discussion above.   4. Non-obstructive CAD: Denies chest pain currently, but says that he tends to have chronic chest pain. Hopefully the addition of isosorbide in the Bidil will help.     Signed, Arbutus Leas, NP 09/04/2016, 1:06 PM Pager: [phone]  Personally seen and examined. Agree with above.  61 year old with NICM ef 45 likely hypertensive in origin with 30 pound weight gain.   - Lasix IV  - adding back losartan 50 for afterload reduction  - starting Bidil  - increasing coreg to  25 bid, stopping dilt 180  - belly edema noted, 1+ BLE edema  Elta [place]  Marlou Porch, MD

## 2016-09-04 NOTE — Progress Notes (Signed)
PROGRESS NOTE  Marco [name] [id number] DOB: 09/05/1955 DOA: 09/02/2016 PCP: Minerva Ends, MD  HPI/Recap of past 24 hours:  Report feeling better, still have hemoptysis On cpap at night, On 2liter o2 day time He report lasix was stopped a week ago  Assessment/Plan: Active Problems:   CAD (coronary artery disease)   Essential hypertension   HLD (hyperlipidemia)   CKD (chronic kidney disease)   PUD (peptic ulcer disease)   DM2 (diabetes mellitus, type 2) (HCC)   Chronic pain   Depression   OSA (obstructive sleep apnea)   Sinusitis, chronic   Insomnia   Chronic gouty arthritis   Arthralgia of multiple joints   Dyspnea   Acute respiratory failure with hypoxia (HCC)   CAP (community acquired pneumonia)   Acute respiratory failure ([place])    Acute respiratoryfailure due to  possible CAP and pulmonary edema, fluids overload He was sent to the ED By EMS, per ems, he was found to have diffuse bilateral wheezing and hypoxia, o2 sats in the 60's , he was put on cpap,  CT angio chest neg PE but + for Bilateral airspace opacities diffusely throughout the lungs, most confluent in the lower lobes bilaterally.    Sputum Cultures pending, blood culture no growth, Influenza negative, urine strep antigen negative, urine legionella antigen pending, mrsa screening negative  he is admitted to stepdown , on cpap at night,  He  Received IV Solumedrol 125 mg x1 and nebs and antibiotics with IV Vanc and Zosyn  in the ED IV antibiotics changed to Rocephin and Zithromax since being admitted Mucinex, nebs   CHF: echo with new reduced EF, troponin negative, no chest pain Most recent cardiac cath in 11/2015 with minimal cad Patient report  He was on lasix which was stopped a week ago,  He is also on ccb in addition to coreg, Will stop amitriptyline which is a new medication for his neuropathy which could cause cardiomyopathy, he is also on high dose of lyrica as well Lasix 40mg  iv daily for  now  cardiology  Consulted for further recommendations  Acute hyperkalemia , k 6.9 on admission Hyperkalemia protocol has been followed in the ER, she has been given IV insulin-D50  Kayexalate-albuterol treatment initial K 6.7 now 5 . EKG without acute changes. No chest pain  k normalized D/c cozaar  AKI on CKDII Baseline cr 1.1-1.2 in 2017 Cr 1.6 on admission ua no infection, no blood renal US no hydro, + medical renal disease, Bilateral hypoechoic renal lesions, incompletely characterized. CT or MRI may prove helpful to further evaluate, as clinically warranted. Will defer to outpatient study,  D/c cozaar  Insulin dependent Type II Diabetes Current blood sugar level is 179 Recent Labs       Lab Results  Component Value Date   HGBA1C 7.7 08/19/2016    Hold home oral diabetic medications.  Lantus , SSI Heart healthy carb modified diet.  Hyperlipidemia Continue home statins   Hypertension BP 127/78   Pulse 68   Controlled Continue home anti-hypertensive medications     GERD, no acute symptoms: Continue PPI  Depression Continue home Elavil   Insomnia Continue ambien  H/o rheumatoid arthritis on chronic prednisone and monoclonal antibody to IL 17  DVT prophylaxis: Lovenox  Code Status:   Full  Family Communication:  Discussed with patient Disposition Plan: Expect patient to be discharged to home after condition improves, wean oxygen Consults called:    cardiology   Procedures:  none  Antibiotics:  Rocephin/zithro   Objective: BP (!) 151/98   Pulse 84   Temp 98.3 F (36.8 C) (Oral)   Resp 18   Ht 5\' 6"  (1.676 m)   Wt 102.3 kg (225 lb 8.5 oz)   SpO2 96%   BMI 36.40 kg/m   Intake/Output Summary (Last 24 hours) at 09/04/16 1230 Last data filed at 09/04/16 0900  Gross per 24 hour  Intake              580 ml  Output             1900 ml  Net            -1320 ml   Filed Weights   09/02/16 1314 09/03/16 0551  Weight: 108.9 kg (240  lb) 102.3 kg (225 lb 8.5 oz)    Exam:   General:  NAD  Cardiovascular: RRR  Respiratory: diminished at basis, no wheezing  Abdomen: Soft/ND/NT, positive BS  Musculoskeletal: trace pitting  Edema bilateral lower extremity  Neuro: aaox3  Data Reviewed: Basic Metabolic Panel:  Recent Labs Lab 09/02/16 1012 09/02/16 1348 09/02/16 1441 09/02/16 1630 09/03/16 0227 09/04/16 0231  NA 145 143 139 141 140 141  K 3.8 6.7* 6.9* 5.0 4.3 4.0  CL 108 107 104 107 103 105  CO2 25  --  26  --  23 28  GLUCOSE 135* 201* 192* 179* 234* 180*  BUN 18 26* 15 22* 20 24*  CREATININE 1.38* 1.60* 1.49* 1.40* 1.66* 1.41*  CALCIUM 8.8  --  8.8*  --  8.4* 8.3*   Liver Function Tests:  Recent Labs Lab 09/02/16 1012 09/03/16 0227  AST 11 24  ALT 22 35  ALKPHOS 33* 36*  BILITOT 0.4 0.7  PROT 6.4 6.4*  ALBUMIN 3.3* 3.1*   No results for input(s): LIPASE, AMYLASE in the last 168 hours. No results for input(s): AMMONIA in the last 168 hours. CBC:  Recent Labs Lab 09/02/16 1012 09/02/16 1323 09/02/16 1348 09/02/16 1630 09/03/16 0227 09/04/16 0231  WBC 10.0 16.0*  --   --  15.5* 19.3*  NEUTROABS  --  11.2*  --   --   --  15.4*  HGB 13.5 15.1 17.3* 16.0 13.3 12.9*  HCT 41.7 47.9 51.0 47.0 42.1 41.0  MCV 89.3 92.3  --   --  90.9 90.7  PLT 263 314  --   --  272 251   Cardiac Enzymes:   No results for input(s): CKTOTAL, CKMB, CKMBINDEX, TROPONINI in the last 168 hours. BNP (last 3 results)  Recent Labs  09/20/15 1137 09/02/16 1040 09/02/16 1323  BNP 33.3 147.8* 203.3*    ProBNP (last 3 results) No results for input(s): PROBNP in the last 8760 hours.  CBG:  Recent Labs Lab 09/03/16 1121 09/03/16 1840 09/03/16 2139 09/04/16 0741 09/04/16 1218  GLUCAP 241* 246* 200* 139* 169*    Recent Results (from the past 240 hour(s))  Culture, blood (routine x 2) Call MD if unable to obtain prior to antibiotics being given     Status: None (Preliminary result)   Collection  Time: 09/02/16  6:10 PM  Result Value Ref Range Status   Specimen Description BLOOD LEFT ANTECUBITAL  Final   Special Requests   Final    BOTTLES DRAWN AEROBIC AND ANAEROBIC BLUE 10CC RED 5CC   Culture NO GROWTH < 24 HOURS  Final   Report Status PENDING  Incomplete  Culture, blood (routine x 2) Call MD if unable to obtain  prior to antibiotics being given     Status: None (Preliminary result)   Collection Time: 09/02/16  6:15 PM  Result Value Ref Range Status   Specimen Description BLOOD LEFT HAND  Final   Special Requests BOTTLES DRAWN AEROBIC ONLY 5CC  Final   Culture NO GROWTH < 24 HOURS  Final   Report Status PENDING  Incomplete  Urine culture     Status: None   Collection Time: 09/02/16  6:40 PM  Result Value Ref Range Status   Specimen Description URINE, CLEAN CATCH  Final   Special Requests NONE  Final   Culture NO GROWTH  Final   Report Status 09/04/2016 FINAL  Final  MRSA PCR Screening     Status: None   Collection Time: 09/03/16  6:09 AM  Result Value Ref Range Status   MRSA by PCR NEGATIVE NEGATIVE Final    Comment:        The GeneXpert MRSA Assay (FDA approved for NASAL specimens only), is one component of a comprehensive MRSA colonization surveillance program. It is not intended to diagnose MRSA infection nor to guide or monitor treatment for MRSA infections.   Respiratory Panel by PCR     Status: None   Collection Time: 09/03/16  1:16 PM  Result Value Ref Range Status   Adenovirus NOT DETECTED NOT DETECTED Final   Coronavirus 229E NOT DETECTED NOT DETECTED Final   Coronavirus HKU1 NOT DETECTED NOT DETECTED Final   Coronavirus NL63 NOT DETECTED NOT DETECTED Final   Coronavirus OC43 NOT DETECTED NOT DETECTED Final   Metapneumovirus NOT DETECTED NOT DETECTED Final   Rhinovirus / Enterovirus NOT DETECTED NOT DETECTED Final   Influenza A NOT DETECTED NOT DETECTED Final   Influenza B NOT DETECTED NOT DETECTED Final   Parainfluenza Virus 1 NOT DETECTED NOT  DETECTED Final   Parainfluenza Virus 2 NOT DETECTED NOT DETECTED Final   Parainfluenza Virus 3 NOT DETECTED NOT DETECTED Final   Parainfluenza Virus 4 NOT DETECTED NOT DETECTED Final   Respiratory Syncytial Virus NOT DETECTED NOT DETECTED Final   Bordetella pertussis NOT DETECTED NOT DETECTED Final   Chlamydophila pneumoniae NOT DETECTED NOT DETECTED Final   Mycoplasma pneumoniae NOT DETECTED NOT DETECTED Final     Studies: US Renal  Result Date: 09/03/2016 CLINICAL DATA:  Elevated creatinine. EXAM: RENAL / URINARY TRACT ULTRASOUND COMPLETE COMPARISON:  None. FINDINGS: Right Kidney: Length: 13.4 cm. A cortical thinning with increased renal cortical echogenicity noted. No hydronephrosis. 2.1 cm lesion identified upper pole. Left Kidney: Length: 13.0 cm. Increased renal cortical echogenicity with associated cortical thinning. No hydronephrosis. 15 mm hypoechoic lesion identified interpolar region. Bladder: Appears normal for degree of bladder distention. IMPRESSION: 1. No hydronephrosis. 2. Increased renal cortical echogenicity with thinning, compatible with medical renal disease. 3. Bilateral hypoechoic renal lesions, incompletely characterized. CT or MRI may prove helpful to further evaluate, as clinically warranted. Electronically Signed   By: Misty Stanley M.D.   On: 09/03/2016 23:00    Scheduled Meds: . allopurinol  100 mg Oral Daily  . atorvastatin  40 mg Oral Daily  . azithromycin  500 mg Oral QPM  . carvedilol  12.5 mg Oral BID  . cefTRIAXone (ROCEPHIN)  IV  1 g Intravenous Q24H  . diltiazem  180 mg Oral Daily  . enoxaparin (LOVENOX) injection  40 mg Subcutaneous Q24H  . famotidine  20 mg Oral Daily  . insulin aspart  0-9 Units Subcutaneous TID WC  . ipratropium-albuterol  3 mL Nebulization  TID  . predniSONE  10 mg Oral Q breakfast  . pregabalin  100 mg Oral TID    Continuous Infusions: . albuterol Stopped (09/02/16 1500)     Time spent: 52mins  Cyndee Giammarco MD, PhD  [facility] Pager [phone]. If 7PM-7AM, please contact night-coverage at [email or web address], password [facility] 09/04/2016, 12:30 PM  LOS: 1 day

## 2016-09-05 DIAGNOSIS — I10 Essential (primary) hypertension: Secondary | ICD-10-CM

## 2016-09-05 LAB — BASIC METABOLIC PANEL
ANION GAP: 8 (ref 5–15)
BUN: 18 mg/dL (ref 6–20)
CALCIUM: 7.9 mg/dL — AB (ref 8.9–10.3)
CO2: 32 mmol/L (ref 22–32)
Chloride: 101 mmol/L (ref 101–111)
Creatinine, Ser: 1.46 mg/dL — ABNORMAL HIGH (ref 0.61–1.24)
GFR, EST AFRICAN AMERICAN: 58 mL/min — AB (ref >60)
GFR, EST NON AFRICAN AMERICAN: 50 mL/min — AB (ref >60)
GLUCOSE: 141 mg/dL — AB (ref 65–99)
POTASSIUM: 3.4 mmol/L — AB (ref 3.5–5.1)
SODIUM: 141 mmol/L (ref 135–145)

## 2016-09-05 LAB — CBC WITH DIFFERENTIAL/PLATELET
BASOS ABS: 0 10*3/uL (ref 0.0–0.1)
Basophils Relative: 0 %
EOS ABS: 0 10*3/uL (ref 0.0–0.7)
Eosinophils Relative: 0 %
HCT: 41.5 % (ref 39.0–52.0)
Hemoglobin: 13.2 g/dL (ref 13.0–17.0)
LYMPHS ABS: 2.4 10*3/uL (ref 0.7–4.0)
Lymphocytes Relative: 18 %
MCH: 28.8 pg (ref 26.0–34.0)
MCHC: 31.8 g/dL (ref 30.0–36.0)
MCV: 90.4 fL (ref 78.0–100.0)
MONO ABS: 2 10*3/uL — AB (ref 0.1–1.0)
Monocytes Relative: 15 %
NEUTROS PCT: 67 %
Neutro Abs: 8.7 10*3/uL — ABNORMAL HIGH (ref 1.7–7.7)
PLATELETS: 252 10*3/uL (ref 150–400)
RBC: 4.59 MIL/uL (ref 4.22–5.81)
RDW: 17.7 % — AB (ref 11.5–15.5)
WBC: 13.1 10*3/uL — AB (ref 4.0–10.5)

## 2016-09-05 LAB — GLUCOSE, CAPILLARY
GLUCOSE-CAPILLARY: 115 mg/dL — AB (ref 65–99)
GLUCOSE-CAPILLARY: 205 mg/dL — AB (ref 65–99)
GLUCOSE-CAPILLARY: 221 mg/dL — AB (ref 65–99)
Glucose-Capillary: 315 mg/dL — ABNORMAL HIGH (ref 65–99)
Glucose-Capillary: 250 mg/dL — ABNORMAL HIGH (ref 65–99)

## 2016-09-05 MED ORDER — AMOXICILLIN-POT CLAVULANATE 875-125 MG PO TABS
1.0000 | ORAL_TABLET | Freq: Two times a day (BID) | ORAL | Status: DC
  Administered 2016-09-05 – 2016-09-06 (×2): 1 via ORAL
  Filled 2016-09-05 (×2): qty 1

## 2016-09-05 MED ORDER — IPRATROPIUM-ALBUTEROL 0.5-2.5 (3) MG/3ML IN SOLN: 3.0000 mL | Freq: Four times a day (QID) | RESPIRATORY_TRACT | Status: DC | PRN

## 2016-09-05 MED ORDER — INSULIN DETEMIR 100 UNIT/ML ~~LOC~~ SOLN
20.0000 [IU] | Freq: Every day | SUBCUTANEOUS | Status: DC
  Administered 2016-09-05: 20 [IU] via SUBCUTANEOUS
  Filled 2016-09-05: qty 0.2

## 2016-09-05 NOTE — Progress Notes (Signed)
Ambulated patient in the hallway in RA and has been maintaining his O2 sats between 91-93%.  Denies any discomfort.

## 2016-09-05 NOTE — Progress Notes (Signed)
Inpatient Diabetes Program Recommendations  AACE/ADA: New Consensus Statement on Inpatient Glycemic Control (2015)  Target Ranges:  Prepandial:   less than 140 mg/dL      Peak postprandial:   less than 180 mg/dL (1-2 hours)      Critically ill patients:  140 - 180 mg/dL   Results for CADEL, OVERMAN (MRN [id number]) as of 09/05/2016 12:15  Ref. Range 09/04/2016 07:41 09/04/2016 12:18 09/04/2016 17:03 09/04/2016 21:06 09/04/2016 23:49 09/05/2016 08:28 09/05/2016 11:58  Glucose-Capillary Latest Ref Range: 65 - 99 mg/dL 139 (H) 169 (H) 207 (H) 351 (H) 221 (H) 115 (H) 205 (H)   Review of Glycemic Control  Inpatient Diabetes Program Recommendations:   Glucose increased into the 300's yesterday at meal times. Patient takes meal coverage at home. Please consider starting Novolog 5 units TID meal coverage in addition to correction scale if patient consumes at least 50% of meals.  Thanks,  Tama Headings RN, MSN, [facility] Inpatient Diabetes Coordinator Team Pager [phone] (8a-5p)

## 2016-09-05 NOTE — Progress Notes (Signed)
PROGRESS NOTE  Marco [name] [id number] DOB: [birth year] DOA: 09/02/2016 PCP: Minerva Ends, MD  HPI/Recap of past 24 hours:  Report feeling much better, cough almost resolved, now on room air   Assessment/Plan: Active Problems:   CAD (coronary artery disease)   Essential hypertension   HLD (hyperlipidemia)   CKD (chronic kidney disease)   PUD (peptic ulcer disease)   DM2 (diabetes mellitus, type 2) (HCC)   Chronic pain   Depression   OSA (obstructive sleep apnea)   Sinusitis, chronic   Insomnia   Chronic gouty arthritis   Arthralgia of multiple joints   Dyspnea   Acute respiratory failure with hypoxia ([place])   CAP (community acquired pneumonia)   Acute respiratory failure ([place])    Acute respiratoryfailure due to  possible CAP and CHF exacerbation He was sent to the ED By EMS, per ems, he was found to have diffuse bilateral wheezing and hypoxia, o2 sats in the 60's , he was put on cpap,  CT angio chest neg PE but + for Bilateral airspace opacities diffusely throughout the lungs, most confluent in the lower lobes bilaterally.    Sputum Cultures pending, blood culture no growth, Influenza negative, urine strep antigen negative, urine legionella antigen negative, mrsa screening negative  he is admitted to stepdown , on cpap at night,  He  Received IV Solumedrol 125 mg x1 and nebs and antibiotics with IV Vanc and Zosyn  in the ED IV antibiotics changed to Rocephin and Zithromax since being admitted Mucinex, nebs Much improved, change abx to oral. Ambulate assess oxygen need. Anticipate discharge home on 1/27.   Acute on chronic systolic and diastolic CHF:  echo with new reduced EF, troponin negative, no chest pain Most recent cardiac cath in 11/2015 with minimal cad Patient report  He was on lasix which was stopped a week ago,  He is also on ccb in addition to coreg, Will stop amitriptyline which is a new medication for his neuropathy which could cause  cardiomyopathy, he is also on high dose of lyrica as well He received iv lasix, cardiology consulted,  cardiac meds adjusted, ccb stopped,currently he is on iv lasix, increased dose of coreg,reduced does of cozaar, newly started on bidil  much improved   PAF: sinus rhythm in the hospital, on betablocker CHADS2VASC score is 3.  He is s/p remote RFA in 2014 in [place] and had been on Xarelto but stopped due to GI bleed remotely and was not started back due to ablation with no reocurrence.   per cardiology  Acute hyperkalemia , k 6.9 on admission Hyperkalemia protocol has been followed in the ER, she has been given IV insulin-D50  Kayexalate-albuterol treatment initial K 6.7 now 5 . EKG without acute changes. No chest pain  k normalized Cozaar dose reduced  AKI on CKDII Baseline cr 1.1-1.2 in 2017 Cr 1.6 on admission ua no infection, no blood renal US no hydro, + medical renal disease, Bilateral hypoechoic renal lesions, incompletely characterized. CT or MRI may prove helpful to further evaluate, as clinically warranted. Will defer to outpatient study,  Cozaar dose reduced  Insulin dependent Type II Diabetes Current blood sugar level is 179 Recent Labs       Lab Results  Component Value Date   HGBA1C 7.7 08/19/2016    Hold home oral diabetic medications.  Lantus , SSI Heart healthy carb modified diet.  Hyperlipidemia Continue home statins   Hypertension  bp meds adjusted per cardiology  GERD, no acute symptoms: Continue PPI  Depression Continue home Elavil   Insomnia Continue ambien  H/o rheumatoid arthritis on chronic prednisone and monoclonal antibody to IL 17  obesity Body mass index is 36.4 kg/m.   DVT prophylaxis: Lovenox  Code Status:   Full  Family Communication:  Discussed with patient Disposition Plan: home on 1/27, need cardiology clearance, wean oxygen, patient request to be discharged early on 1/27, cause he need to go to a funeral at  12noon Consultant:    cardiology   Procedures:  none  Antibiotics:  Rocephin/zithro from admission to 1/26  augmentin from 1/26   Objective: BP (!) 145/91   Pulse 72   Temp 97.9 F (36.6 C) (Oral)   Resp 14   Ht 5\' 6"  (1.676 m)   Wt 102.3 kg (225 lb 8.5 oz)   SpO2 94%   BMI 36.40 kg/m   Intake/Output Summary (Last 24 hours) at 09/05/16 1321 Last data filed at 09/05/16 1100  Gross per 24 hour  Intake              290 ml  Output             4750 ml  Net            -4460 ml   Filed Weights   09/02/16 1314 09/03/16 0551  Weight: 108.9 kg (240 lb) 102.3 kg (225 lb 8.5 oz)    Exam:   General:  NAD  Cardiovascular: RRR  Respiratory: improved aeration, no wheezing, few crackles at basis, no rhonchi  Abdomen: Soft/ND/NT, positive BS  Musculoskeletal: trace pitting  Edema bilateral lower extremity has improved  Neuro: aaox3  Data Reviewed: Basic Metabolic Panel:  Recent Labs Lab 09/02/16 1012  09/02/16 1441 09/02/16 1630 09/03/16 0227 09/04/16 0231 09/05/16 0336  NA 145  < > 139 141 140 141 141  K 3.8  < > 6.9* 5.0 4.3 4.0 3.4*  CL 108  < > 104 107 103 105 101  CO2 25  --  26  --  23 28 32  GLUCOSE 135*  < > 192* 179* 234* 180* 141*  BUN 18  < > 15 22* 20 24* 18  CREATININE 1.38*  < > 1.49* 1.40* 1.66* 1.41* 1.46*  CALCIUM 8.8  --  8.8*  --  8.4* 8.3* 7.9*  < > = values in this interval not displayed. Liver Function Tests:  Recent Labs Lab 09/02/16 1012 09/03/16 0227  AST 11 24  ALT 22 35  ALKPHOS 33* 36*  BILITOT 0.4 0.7  PROT 6.4 6.4*  ALBUMIN 3.3* 3.1*   No results for input(s): LIPASE, AMYLASE in the last 168 hours. No results for input(s): AMMONIA in the last 168 hours. CBC:  Recent Labs Lab 09/02/16 1012 09/02/16 1323 09/02/16 1348 09/02/16 1630 09/03/16 0227 09/04/16 0231 09/05/16 0336  WBC 10.0 16.0*  --   --  15.5* 19.3* 13.1*  NEUTROABS  --  11.2*  --   --   --  15.4* 8.7*  HGB 13.5 15.1 17.3* 16.0 13.3 12.9* 13.2    HCT 41.7 47.9 51.0 47.0 42.1 41.0 41.5  MCV 89.3 92.3  --   --  90.9 90.7 90.4  PLT 263 314  --   --  272 251 252   Cardiac Enzymes:   No results for input(s): CKTOTAL, CKMB, CKMBINDEX, TROPONINI in the last 168 hours. BNP (last 3 results)  Recent Labs  09/20/15 1137 09/02/16 1040 09/02/16 1323  BNP  33.3 147.8* 203.3*    ProBNP (last 3 results) No results for input(s): PROBNP in the last 8760 hours.  CBG:  Recent Labs Lab 09/04/16 1703 09/04/16 2106 09/04/16 2349 09/05/16 0828 09/05/16 1158  GLUCAP 207* 351* 221* 115* 205*    Recent Results (from the past 240 hour(s))  Culture, blood (routine x 2) Call MD if unable to obtain prior to antibiotics being given     Status: None (Preliminary result)   Collection Time: 09/02/16  6:10 PM  Result Value Ref Range Status   Specimen Description BLOOD LEFT ANTECUBITAL  Final   Special Requests   Final    BOTTLES DRAWN AEROBIC AND ANAEROBIC BLUE 10CC RED 5CC   Culture NO GROWTH 3 DAYS  Final   Report Status PENDING  Incomplete  Culture, blood (routine x 2) Call MD if unable to obtain prior to antibiotics being given     Status: None (Preliminary result)   Collection Time: 09/02/16  6:15 PM  Result Value Ref Range Status   Specimen Description BLOOD LEFT HAND  Final   Special Requests BOTTLES DRAWN AEROBIC ONLY 5CC  Final   Culture NO GROWTH 3 DAYS  Final   Report Status PENDING  Incomplete  Urine culture     Status: None   Collection Time: 09/02/16  6:40 PM  Result Value Ref Range Status   Specimen Description URINE, CLEAN CATCH  Final   Special Requests NONE  Final   Culture NO GROWTH  Final   Report Status 09/04/2016 FINAL  Final  MRSA PCR Screening     Status: None   Collection Time: 09/03/16  6:09 AM  Result Value Ref Range Status   MRSA by PCR NEGATIVE NEGATIVE Final    Comment:        The GeneXpert MRSA Assay (FDA approved for NASAL specimens only), is one component of a comprehensive MRSA  colonization surveillance program. It is not intended to diagnose MRSA infection nor to guide or monitor treatment for MRSA infections.   Respiratory Panel by PCR     Status: None   Collection Time: 09/03/16  1:16 PM  Result Value Ref Range Status   Adenovirus NOT DETECTED NOT DETECTED Final   Coronavirus 229E NOT DETECTED NOT DETECTED Final   Coronavirus HKU1 NOT DETECTED NOT DETECTED Final   Coronavirus NL63 NOT DETECTED NOT DETECTED Final   Coronavirus OC43 NOT DETECTED NOT DETECTED Final   Metapneumovirus NOT DETECTED NOT DETECTED Final   Rhinovirus / Enterovirus NOT DETECTED NOT DETECTED Final   Influenza A NOT DETECTED NOT DETECTED Final   Influenza B NOT DETECTED NOT DETECTED Final   Parainfluenza Virus 1 NOT DETECTED NOT DETECTED Final   Parainfluenza Virus 2 NOT DETECTED NOT DETECTED Final   Parainfluenza Virus 3 NOT DETECTED NOT DETECTED Final   Parainfluenza Virus 4 NOT DETECTED NOT DETECTED Final   Respiratory Syncytial Virus NOT DETECTED NOT DETECTED Final   Bordetella pertussis NOT DETECTED NOT DETECTED Final   Chlamydophila pneumoniae NOT DETECTED NOT DETECTED Final   Mycoplasma pneumoniae NOT DETECTED NOT DETECTED Final     Studies: No results found.  Scheduled Meds: . allopurinol  100 mg Oral Daily  . atorvastatin  40 mg Oral Daily  . azithromycin  500 mg Oral QPM  . carvedilol  25 mg Oral BID  . cefTRIAXone (ROCEPHIN)  IV  1 g Intravenous Q24H  . enoxaparin (LOVENOX) injection  40 mg Subcutaneous Q24H  . famotidine  20 mg Oral Daily  .  furosemide  80 mg Intravenous Once  . furosemide  80 mg Intravenous BID  . guaiFENesin  600 mg Oral BID  . insulin aspart  0-9 Units Subcutaneous TID WC  . ipratropium-albuterol  3 mL Nebulization TID  . isosorbide-hydrALAZINE  1 tablet Oral TID  . losartan  50 mg Oral Daily  . predniSONE  10 mg Oral Q breakfast  . pregabalin  100 mg Oral TID    Continuous Infusions:    Time spent: 65mins  Emeline Simpson MD,  PhD  [facility] Pager [phone]. If 7PM-7AM, please contact night-coverage at [email or web address], password [facility] 09/05/2016, 1:21 PM  LOS: 2 days

## 2016-09-05 NOTE — Progress Notes (Addendum)
Progress Note  Patient Name: Marco [name] Date of Encounter: 09/05/2016  Primary Cardiologist: [name]  Subjective   Breathing has improved today. No CP  Inpatient Medications    Scheduled Meds: . allopurinol  100 mg Oral Daily  . atorvastatin  40 mg Oral Daily  . azithromycin  500 mg Oral QPM  . carvedilol  25 mg Oral BID  . cefTRIAXone (ROCEPHIN)  IV  1 g Intravenous Q24H  . enoxaparin (LOVENOX) injection  40 mg Subcutaneous Q24H  . famotidine  20 mg Oral Daily  . furosemide  80 mg Intravenous Once  . furosemide  80 mg Intravenous BID  . guaiFENesin  600 mg Oral BID  . insulin aspart  0-9 Units Subcutaneous TID WC  . ipratropium-albuterol  3 mL Nebulization TID  . isosorbide-hydrALAZINE  1 tablet Oral TID  . losartan  50 mg Oral Daily  . predniSONE  10 mg Oral Q breakfast  . pregabalin  100 mg Oral TID   Continuous Infusions: . albuterol Stopped (09/02/16 1500)   PRN Meds: guaiFENesin, HYDROcodone-acetaminophen, zolpidem   Vital Signs    Vitals:   09/04/16 2350 09/05/16 0419 09/05/16 0700 09/05/16 0915  BP: 108/75 132/81 (!) 134/95 (!) 145/85  Pulse: 78 69 64 75  Resp: (!) 28 (!) 24 17   Temp: 98 F (36.7 C) 98.5 F (36.9 C) 98.8 F (37.1 C)   TempSrc: Oral Oral Oral   SpO2: 95% 93% 93%   Weight:      Height:        Intake/Output Summary (Last 24 hours) at 09/05/16 0921 Last data filed at 09/05/16 0315  Gross per 24 hour  Intake              530 ml  Output             4600 ml  Net            -4070 ml   Filed Weights   09/02/16 1314 09/03/16 0551  Weight: 240 lb (108.9 kg) 225 lb 8.5 oz (102.3 kg)    Telemetry    SR - Personally Reviewed  ECG    N/A - Personally Reviewed  Physical Exam   GEN: No acute distress. Obese AAM Neck: + JVD Cardiac: RRR, no murmurs, rubs, or gallops.  Respiratory: Clear to auscultation bilaterally. GI: Soft, nontender, non-distended  MS: No edema; No deformity. Neuro:  AAOx3. Psych: Normal  affect  Labs    Chemistry Recent Labs Lab 09/02/16 1012  09/03/16 0227 09/04/16 0231 09/05/16 0336  NA 145  < > 140 141 141  K 3.8  < > 4.3 4.0 3.4*  CL 108  < > 103 105 101  CO2 25  < > 23 28 32  GLUCOSE 135*  < > 234* 180* 141*  BUN 18  < > 20 24* 18  CREATININE 1.38*  < > 1.66* 1.41* 1.46*  CALCIUM 8.8  < > 8.4* 8.3* 7.9*  PROT 6.4  --  6.4*  --   --   ALBUMIN 3.3*  --  3.1*  --   --   AST 11  --  24  --   --   ALT 22  --  35  --   --   ALKPHOS 33*  --  36*  --   --   BILITOT 0.4  --  0.7  --   --   GFRNONAA 55*  < > 43* 52* 50*  GFRAA  63  < > 50* >60 58*  ANIONGAP  --   < > 14 8 8   < > = values in this interval not displayed.   Hematology Recent Labs Lab 09/03/16 0227 09/04/16 0231 09/05/16 0336  WBC 15.5* 19.3* 13.1*  RBC 4.63 4.52 4.59  HGB 13.3 12.9* 13.2  HCT 42.1 41.0 41.5  MCV 90.9 90.7 90.4  MCH 28.7 28.5 28.8  MCHC 31.6 31.5 31.8  RDW 17.5* 17.9* 17.7*  PLT 272 251 252    Cardiac EnzymesNo results for input(s): TROPONINI in the last 168 hours.  Recent Labs Lab 09/02/16 1346  TROPIPOC 0.05     BNP Recent Labs Lab 09/02/16 1040 09/02/16 1323  BNP 147.8* 203.3*     DDimer  Recent Labs Lab 09/02/16 1012  DDIMER 0.45     Radiology    US Renal  Result Date: 09/03/2016 CLINICAL DATA:  Elevated creatinine. EXAM: RENAL / URINARY TRACT ULTRASOUND COMPLETE COMPARISON:  None. FINDINGS: Right Kidney: Length: 13.4 cm. A cortical thinning with increased renal cortical echogenicity noted. No hydronephrosis. 2.1 cm lesion identified upper pole. Left Kidney: Length: 13.0 cm. Increased renal cortical echogenicity with associated cortical thinning. No hydronephrosis. 15 mm hypoechoic lesion identified interpolar region. Bladder: Appears normal for degree of bladder distention. IMPRESSION: 1. No hydronephrosis. 2. Increased renal cortical echogenicity with thinning, compatible with medical renal disease. 3. Bilateral hypoechoic renal lesions,  incompletely characterized. CT or MRI may prove helpful to further evaluate, as clinically warranted. Electronically Signed   By: Misty Stanley M.D.   On: 09/03/2016 23:00    Cardiac Studies   Echocardiogram: 09/03/16 Study Conclusions  - Left ventricle: The cavity size was normal. Wall thickness was increased in a pattern of mild LVH. Systolic function was mildly reduced. The estimated ejection fraction was in the range of 45% to 50%. Diffuse hypokinesis. Doppler parameters are consistent with pseudonormal left ventricular relaxation (grade 2 diastolic dysfunction). The E/e&' ratio is >15, suggesting elevated LV filling pressure. - Left atrium: The atrium was at the upper limits of normal in size. - Right atrium: The atrium was mildly dilated. - Inferior vena cava: The vessel was normal in size. The respirophasic diameter changes were in the normal range (>= 50%), consistent with normal central venous pressure.  Impressions:  - Compared to a prior study in 2016, the LVEF is reduced to 45-50% with global hypokinesis, grade 2 DD and elevated LV filling pressure.  Patient Profile     61 y.o. male with a past medical history of DM, HTN, non-obstructive CAD and PAF. He presented to the ED on 09/02/16 with acute on chronic systolic CHF.   Assessment & Plan    1. Acute on chronic systolic CHF: Volume status has improved today. Breathing better, abd edema improved. Had 4.6L UOP yesterday, though no weights recorded. -- Currently on 80mg  IV BID. -- Increased Coreg to 25mg  BID, added bidil yesterday with improvement in blood pressure.  2. CAP: On antibiotics per primary team  3. HTN: Losartan 50mg  was started back yesterday with stable Cr. Blood pressure much improved.   4. Non-obstructive CAD: No anginal symptoms reported.    5. PAF: Dx back in 2014, was on Xarelto but stopped related to anemia. SR on telemetry  Signed, Reino Bellis, NP  09/05/2016,  9:21 AM    Personally seen and examined. Agree with above.  HF  - continue IV lasix  - good output  - Would like to make a funeral tomorrow. Reassess  tomorrow for possible DC.   Lungs no wheeze, RRR, +JVD  Candee Furbish, MD

## 2016-09-05 NOTE — Progress Notes (Signed)
Patient stated he did not wish to wear CPAP for tonight. CPAP still in room with patient. RT made pt aware that if he changed his mind to call.

## 2016-09-06 DIAGNOSIS — I5043 Acute on chronic combined systolic (congestive) and diastolic (congestive) heart failure: Secondary | ICD-10-CM

## 2016-09-06 DIAGNOSIS — E875 Hyperkalemia: Secondary | ICD-10-CM

## 2016-09-06 LAB — CBC
HEMATOCRIT: 43.2 % (ref 39.0–52.0)
HEMOGLOBIN: 13.7 g/dL (ref 13.0–17.0)
MCH: 28.4 pg (ref 26.0–34.0)
MCHC: 31.7 g/dL (ref 30.0–36.0)
MCV: 89.6 fL (ref 78.0–100.0)
Platelets: 261 10*3/uL (ref 150–400)
RBC: 4.82 MIL/uL (ref 4.22–5.81)
RDW: 17.2 % — AB (ref 11.5–15.5)
WBC: 13.3 10*3/uL — ABNORMAL HIGH (ref 4.0–10.5)

## 2016-09-06 LAB — EXPECTORATED SPUTUM ASSESSMENT W REFEX TO RESP CULTURE

## 2016-09-06 LAB — GLUCOSE, CAPILLARY: GLUCOSE-CAPILLARY: 164 mg/dL — AB (ref 65–99)

## 2016-09-06 LAB — BASIC METABOLIC PANEL
Anion gap: 10 (ref 5–15)
BUN: 20 mg/dL (ref 6–20)
CALCIUM: 8.2 mg/dL — AB (ref 8.9–10.3)
CHLORIDE: 100 mmol/L — AB (ref 101–111)
CO2: 29 mmol/L (ref 22–32)
CREATININE: 1.51 mg/dL — AB (ref 0.61–1.24)
GFR calc non Af Amer: 48 mL/min — ABNORMAL LOW (ref >60)
GFR, EST AFRICAN AMERICAN: 56 mL/min — AB (ref >60)
Glucose, Bld: 191 mg/dL — ABNORMAL HIGH (ref 65–99)
Potassium: 3.2 mmol/L — ABNORMAL LOW (ref 3.5–5.1)
Sodium: 139 mmol/L (ref 135–145)

## 2016-09-06 LAB — EXPECTORATED SPUTUM ASSESSMENT W GRAM STAIN, RFLX TO RESP C

## 2016-09-06 LAB — BRAIN NATRIURETIC PEPTIDE: B Natriuretic Peptide: 58.3 pg/mL (ref 0.0–100.0)

## 2016-09-06 MED ORDER — FEBUXOSTAT 40 MG PO TABS: 40.0000 mg | ORAL_TABLET | Freq: Every day | ORAL | 0 refills | Status: DC

## 2016-09-06 MED ORDER — LOSARTAN POTASSIUM 100 MG PO TABS: 50.0000 mg | ORAL_TABLET | Freq: Every day | ORAL | 5 refills | Status: DC

## 2016-09-06 MED ORDER — AMOXICILLIN-POT CLAVULANATE 875-125 MG PO TABS: 1.0000 | ORAL_TABLET | Freq: Two times a day (BID) | ORAL | 0 refills | Status: AC

## 2016-09-06 MED ORDER — ISOSORB DINITRATE-HYDRALAZINE 20-37.5 MG PO TABS
1.0000 | ORAL_TABLET | Freq: Three times a day (TID) | ORAL | 0 refills | Status: DC
Start: 2016-09-06 — End: 2016-10-10

## 2016-09-06 MED ORDER — CARVEDILOL 25 MG PO TABS: 25.0000 mg | ORAL_TABLET | Freq: Two times a day (BID) | ORAL | 0 refills | Status: DC

## 2016-09-06 MED ORDER — POTASSIUM CHLORIDE CRYS ER 20 MEQ PO TBCR
40.0000 meq | EXTENDED_RELEASE_TABLET | Freq: Once | ORAL | Status: AC
  Administered 2016-09-06: 40 meq via ORAL
  Filled 2016-09-06: qty 2

## 2016-09-06 NOTE — Progress Notes (Signed)
Subjective:  He feels good overnight with no shortness of breath.  No weights have been obtained the past few days.  Relatively good diuresis and his BNP is now normal.  Hypokalemic this morning.  Wants to get out to go to a funeral and this will be fine from a cardiac viewpoint  Objective:  Vital Signs in the last 24 hours: BP (!) 158/97 (BP Location: Left Arm)   Pulse 77   Temp 98.1 F (36.7 C) (Oral)   Resp 16   Ht 5\' 6"  (1.676 m)   Wt 102.3 kg (225 lb 8.5 oz)   SpO2 96%   BMI 36.40 kg/m   Physical Exam: Obese black male in no acute distress Lungs:  Clear  Cardiac:  Regular rhythm, normal S1 and S2, no S3 Abdomen:  Soft, nontender, no masses Extremities:  No edema present  Intake/Output from previous day: 01/26 0701 - 01/27 0700 In: 240 [P.O.:240] Out: 2245 [Urine:2245] Weight Filed Weights   09/02/16 1314 09/03/16 0551  Weight: 108.9 kg (240 lb) 102.3 kg (225 lb 8.5 oz)    Lab Results: Basic Metabolic Panel:  Recent Labs  09/05/16 0336 09/06/16 0240  NA 141 139  K 3.4* 3.2*  CL 101 100*  CO2 32 29  GLUCOSE 141* 191*  BUN 18 20  CREATININE 1.46* 1.51*    CBC:  Recent Labs  09/04/16 0231 09/05/16 0336 09/06/16 0240  WBC 19.3* 13.1* 13.3*  NEUTROABS 15.4* 8.7*  --   HGB 12.9* 13.2 13.7  HCT 41.0 41.5 43.2  MCV 90.7 90.4 89.6  PLT 251 252 261    BNP    Component Value Date/Time   BNP 58.3 09/06/2016 0240   BNP 147.8 (H) 09/02/2016 1040   Telemetry: Personally reviewed, sinus rhythm  Assessment/Plan:  1.  Acute on chronic systolic and diastolic heart failure likely due to hypertensive heart disease-clinically better but blood pressure still elevated 2.  Obesity 3.  Diabetes 4.  Probable concomitant upper respiratory infection with elevated white count 5.  History of atrial fibrillation ablation  Recommendations:  From a cardiac viewpoint is fine to go home.  He will need to have his potassium repleted and would send home on a regular  dose of furosemide.  He was also instructed to weigh daily and to report increases in weight to his primary physician.  His target blood pressure should be less than [id number] systolic on a chronic basis.  He will need to have his BiDil titrated as an outpatient.  He should make an appointment to be seen in the office in 2-3 weeks.      Kerry Hough  MD [facility]  09/06/2016, 8:26 AM

## 2016-09-06 NOTE — Discharge Summary (Signed)
Discharge Summary  Marco [name] MEQ:[id number] DOB: 05/05/1956  PCP: Marco Ends, MD  Admit date: 09/02/2016 Discharge date: 09/06/2016  Time spent: <74mns  Recommendations for Outpatient Follow-up:  1. F/u with PMD within a week  for hospital discharge follow up, repeat cbc/bmp at follow up.  2. Bilateral hypoechoic renal lesions, incompletely characterized by ultrasound. CT or MRI may prove helpful to further evaluate, as clinically warranted. to be followed up by primary care doctor, patient is aware. 3. F/u with cardiology in two weeks  Discharge Diagnoses:  Active Hospital Problems   Diagnosis Date Noted  . Acute respiratory failure ([name] 09/03/2016  . Acute respiratory failure with hypoxia (Marco [name] 09/02/2016  . CAP (community acquired pneumonia) 09/02/2016  . Dyspnea 02/14/2016  . Chronic gouty arthritis 11/29/2015  . Arthralgia of multiple joints 11/27/2015  . Insomnia 10/16/2015  . Sinusitis, chronic 10/16/2015  . OSA (obstructive sleep apnea) 07/12/2015  . Depression 05/16/2015  . Chronic pain   . Essential hypertension 12/24/2014  . PUD (peptic ulcer disease) 12/24/2014  . DM2 (diabetes mellitus, type 2) ([place] 12/24/2014  . CAD (coronary artery disease) 12/24/2014  . HLD (hyperlipidemia) 12/24/2014  . CKD (chronic kidney disease) 12/24/2014    Resolved Hospital Problems   Diagnosis Date Noted Date Resolved  No resolved problems to display.    Discharge Condition: stable  Diet recommendation: heart healthy/carb modified  Filed Weights   09/02/16 1314 09/03/16 0551  Weight: 108.9 kg (240 lb) 102.3 kg (225 lb 8.5 oz)    History of present illness:  PCP: Marco Ends MD   Patient coming from:  Home  Chief Complaint: Shortness of breath   HPI: RCarel Carrieris a 61y.o. male presenting with 3 day history of SOB associated with fever, chills and productive cough. Symptoms were rapidly worsening at the time of presentation. Denies headaches,  nausea or vomiting, No abdominal pain or rash, Patient has no prior history of ER visits. He denies any prior history of pneumonia., chronic lung disease or heart failure. Not aware of sick contacts. No recent trips. No rashes. Denies chest pain or palpitations. He had a recent URI sx for he received steroid taper and was taking inhaler without  formal infection. No other complaints Does not smoke.   ED Course:  BP 136/84   Pulse 66   Resp 13   Ht '5\' 6"'$  (1.676 m)   Wt 108.9 kg (240 lb)   SpO2 97%   BMI 38.74 kg/m    CT angio chest neg PE but + for Bilateral airspace opacities diffusely throughout the lungs, most confluent in the lower lobes bilaterally. No masses seen   Received IV Solumedrol 125 mg x1 and nebs and antibiotics with IV Vanc and Zosyn   Arrived on CPAP, now on .Placed on Bipap  WBC 16. Hb 17. Cr 1.4  BNP 203  Glu 179  K was 6.7 now with normalizing value  Tnless than 0.05     Hospital Course:  Active Problems:   CAD (coronary artery disease)   Essential hypertension   HLD (hyperlipidemia)   CKD (chronic kidney disease)   PUD (peptic ulcer disease)   DM2 (diabetes mellitus, type 2) (Marco [name])   Chronic pain   Depression   OSA (obstructive sleep apnea)   Sinusitis, chronic   Insomnia   Chronic gouty arthritis   Arthralgia of multiple joints   Dyspnea   Acute respiratory failure with hypoxia (Marco [name])   CAP (community acquired pneumonia)  Acute respiratory failure (Marco [name])   Acute respiratoryfailuredue to possibleCAP and CHF exacerbation He was sent to the ED By EMS, per ems, he was found to have diffuse bilateral wheezing and hypoxia, o2 sats in the 60's , he was put on cpap,  CT angio chest neg PE but + for Bilateral airspace opacities diffusely throughout the lungs, most confluent in the lower lobes bilaterally.    Sputum Cultures pending, blood culture no growth, Influenza negative, urine strep antigen negative, urine legionella antigen negative, mrsa screening  negative he is admitted to stepdown , on cpap at night,  He Received IV Solumedrol 125 mg x1 and nebs and antibiotics with IV Vanc and Zosynin the ED IV antibiotics changed to Rocephin and Zithromax since being admitted Mucinex, nebs Much improved, change abx to oral.  He is able to weaned off oxygen, Ambulate o2 sats above 90%.   Acute on chronic systolic and diastolic CHF:  echo with new reduced EF, troponin negative, no chest pain Most recent cardiac cath in 11/2015 with minimal cad Patient report  He was on lasix which was stopped a week ago,  He is also on ccb in addition to coreg, Will stop amitriptyline which is a new medication for his neuropathy which could cause cardiomyopathy, he is also on high dose of lyrica as well He received iv lasix, cardiology consulted,  cardiac meds adjusted, ccb stopped,currently he is on iv lasix, increased dose of coreg,reduced does of cozaar, newly started on bidil  much improved, he is cleared to discharge home on home dose lasix and coreg/bidil/corzaar.   PAF: sinus rhythm in the hospital, on betablocker CHADS2VASC score is 3.  He is s/p remote RFA in 2014 in [place] and had been on Xarelto but stopped due to GI bleed remotely and was not started back due to ablation with no reocurrence. per cardiology  Acute hyperkalemia, k 6.9 on admission Hyperkalemia protocol has been followed in the ER, she has been given IV insulin-D50 Kayexalate-albuterol treatment initial K 6.7 now 5 . EKG without acute changes. No chest pain  k normalized Cozaar dose reduced  AKI on CKDII Baseline cr 1.1-1.2 in 2017 Cr 1.6 on admission ua no infection, no blood renal US no hydro, + medical renal disease, Bilateral hypoechoic renal lesions, incompletely characterized. CT or MRI may prove helpful to further evaluate, as clinically warranted. Will defer to outpatient study,  Cozaar dose reduced  Insulin dependent Type II Diabetes  a1c 7.5 Hold home oral  diabetic medications.  levemir, SSI Heart healthy carb modified diet.  Hyperlipidemia Continue home statins   Hypertension  He is discharged on increased dose of coreg, new meds bidil, decreased dose of cozaar,  cardizem stopped due to impaired EF. Patient is instructed to monitor home blood pressure and bp meds titration on outpatient basis.     GERD,no acute symptoms: Continue PPI  Depression No acute issue, elavil stopped due to concerning cardiac side effect, patient agrees to.  Insomnia Continue ambien  H/o rheumatoid arthritis on chronic prednisone and monoclonal antibody to IL 17  obesity Body mass index is 36.4 kg/m.   DVT prophylaxis while in the hospital:Lovenox  Code Status:Full  Family Communication:Discussed with patient Disposition Plan:home on 1/27 with cardiology clearance Consultant:cardiology   Procedures:  none  Antibiotics:  Rocephin/zithro from admission to 1/26  augmentin from 1/26   Discharge Exam: BP (!) 158/97 (BP Location: Left Arm)   Pulse 77   Temp 98.1 F (36.7 C) (Oral)  Resp 16   Ht '5\' 6"'$  (1.676 m)   Wt 102.3 kg (225 lb 8.5 oz)   SpO2 96%   BMI 36.40 kg/m    General:  NAD  Cardiovascular: RRR  Respiratory: wheezing initially heard has resolved at discharge, crackles has resolved, much improved aeration, no rhonchi  Abdomen: Soft/ND/NT, positive BS  Musculoskeletal: trace pitting  Edema bilateral lower extremity has resolved  Neuro: aaox3  Discharge Instructions You were cared for by a hospitalist during your hospital stay. If you have any questions about your discharge medications or the care you received while you were in the hospital after you are discharged, you can call the unit and asked to speak with the hospitalist on call if the hospitalist that took care of you is not available. Once you are discharged, your primary care physician will handle any further medical issues.  Please note that NO REFILLS for any discharge medications will be authorized once you are discharged, as it is imperative that you return to your primary care physician (or establish a relationship with a primary care physician if you do not have one) for your aftercare needs so that they can reassess your need for medications and monitor your lab values.  Discharge Instructions    Diet - low sodium heart healthy    Complete by:  As directed    Carb modified   Increase activity slowly    Complete by:  As directed      Allergies as of 09/06/2016   No Known Allergies     Medication List    STOP taking these medications   allopurinol 100 MG tablet Commonly known as:  ZYLOPRIM   amitriptyline 50 MG tablet Commonly known as:  ELAVIL   azithromycin 250 MG tablet Commonly known as:  ZITHROMAX   diltiazem 180 MG 24 hr capsule Commonly known as:  DILACOR XR   pantoprazole 40 MG tablet Commonly known as:  PROTONIX     TAKE these medications   ACCU-CHEK AVIVA PLUS w/Device Kit 1 Device by Does not apply route 3 (three) times daily after meals. E11.9   ACCU-CHEK FASTCLIX LANCETS Misc 1 each by Other route 3 (three) times daily.   amoxicillin-clavulanate 875-125 MG tablet Commonly known as:  AUGMENTIN Take 1 tablet by mouth every 12 (twelve) hours.   ANDROGEL PUMP 20.25 MG/ACT (1.62%) Gel Generic drug:  Testosterone Apply 4 application topically every morning.   atorvastatin 40 MG tablet Commonly known as:  LIPITOR TAKE ONE TABLET BY MOUTH ONCE DAILY   BD PEN NEEDLE NANO U/F 32G X 4 MM Misc Generic drug:  Insulin Pen Needle USE ONE PEN NEEDLE THREE TIMES DAILY   carvedilol 25 MG tablet Commonly known as:  COREG Take 1 tablet (25 mg total) by mouth 2 (two) times daily. What changed:  See the new instructions.   COSENTYX 150 MG/ML Sosy Generic drug:  Secukinumab Inject 150 mg into the skin every 28 (twenty-eight) days.   EQ ASPIRIN ADULT LOW DOSE 81 MG EC  tablet Generic drug:  aspirin TAKE ONE TABLET BY MOUTH ONCE DAILY   febuxostat 40 MG tablet Commonly known as:  ULORIC Take 1 tablet (40 mg total) by mouth daily.   glucose blood test strip Commonly known as:  ACCU-CHEK AVIVA PLUS 1 each by Other route 3 (three) times daily. E11.9   ACCU-CHEK AVIVA PLUS test strip Generic drug:  glucose blood USE ONE STRIP TO CHECK BLOOD GLUCOSE 3 TIMES DAILY   HYDROcodone-acetaminophen 5-325  MG tablet Commonly known as:  NORCO/VICODIN Take 1 tablet by mouth every 8 (eight) hours as needed for moderate pain. Per pain management   insulin aspart 100 UNIT/ML injection Commonly known as:  novoLOG Inject 14-24 Units into the skin 3 (three) times daily with meals. Reported on 09/20/2015   Ipratropium-Albuterol 20-100 MCG/ACT Aers respimat Commonly known as:  COMBIVENT RESPIMAT Inhale 1 puff into the lungs every 6 (six) hours.   isosorbide-hydrALAZINE 20-37.5 MG tablet Commonly known as:  BIDIL Take 1 tablet by mouth 3 (three) times daily.   LEVEMIR FLEXTOUCH 100 UNIT/ML Pen Generic drug:  Insulin Detemir INJECT 60 UNITS SUBCUTANEOUSLY ONCE DAILY AT  10  PM   lidocaine 5 % ointment Commonly known as:  XYLOCAINE Apply 1 application topically 4 (four) times daily as needed. Apply to feet   losartan 100 MG tablet Commonly known as:  COZAAR Take 0.5 tablets (50 mg total) by mouth daily. What changed:  how much to take   metFORMIN 500 MG 24 hr tablet Commonly known as:  GLUCOPHAGE-XR Take 2 tablets (1,000 mg total) by mouth 2 (two) times daily.   predniSONE 10 MG tablet Commonly known as:  DELTASONE Take 10 mg by mouth daily with breakfast. What changed:  Another medication with the same name was removed. Continue taking this medication, and follow the directions you see here.   pregabalin 100 MG capsule Commonly known as:  LYRICA Take 1 capsule (100 mg total) by mouth 3 (three) times daily.   ranitidine 150 MG tablet Commonly known as:   ZANTAC TAKE ONE TABLET BY MOUTH AT BEDTIME   umeclidinium-vilanterol 62.5-25 MCG/INH Aepb Commonly known as:  ANORO ELLIPTA Inhale 1 puff into the lungs daily.   vitamin B-12 1000 MCG tablet Commonly known as:  CYANOCOBALAMIN Take 1 tablet (1,000 mcg total) by mouth daily.   zolpidem 10 MG tablet Commonly known as:  AMBIEN Take 10 mg by mouth at bedtime as needed for sleep.      No Known Allergies Follow-up Information    [name], MD Follow up in 3 week(s).   Specialty:  Cardiology Why:  for heart failure Contact information: [address] [place] [address] [phone]        Marco Ends, MD Follow up in 1 week(s).   Specialty:  Family Medicine Why:  hospital discharge follow up, repeat cbc/bmp at follow up. Bilateral hypoechoic renal lesions, incompletely characterized by ultrasound CT or MRI may prove helpful to further evaluate, as clinically warranted. to be followed up by primary care doctor Contact information: [place] [address] [phone]            The results of significant diagnostics from this hospitalization (including imaging, microbiology, ancillary and laboratory) are listed below for reference.    Significant Diagnostic Studies: Dg Chest 2 View  Result Date: 09/03/2016 CLINICAL DATA:  Community acquired pneumonia, shortness of breath, midsternal chest pain, diabetes mellitus, hypertension, nonobstructive coronary artery disease, rheumatoid Arthritis EXAM: CHEST  2 VIEW COMPARISON:  09/02/2016 FINDINGS: Minimal enlargement of cardiac silhouette with pulmonary vascular congestion. Mediastinal contours normal. Bronchitic changes with persistent interstitial prominence at the mid to lower lungs, greatest at RIGHT lung base, question pulmonary edema versus infection. Aeration at the bases appears improved since the previous exam. No gross pleural effusion or pneumothorax. Scattered endplate spur  formation thoracic spine. IMPRESSION: Enlargement of cardiac silhouette with pulmonary vascular congestion and mid to basilar infiltrates bilaterally greatest at  RIGHT lung base, question pulmonary edema versus pneumonia. Improved aeration at lung bases since prior study. Electronically Signed   By: Lavonia Dana M.D.   On: 09/03/2016 09:09   Dg Chest 2 View  Result Date: 09/02/2016 CLINICAL DATA:  Hemoptysis for 1 week.  Shortness of breath EXAM: CHEST  2 VIEW COMPARISON:  01/22/2016 FINDINGS: Heart is borderline in size. Interstitial prominence throughout the lungs, most pronounced in the perihilar regions and lower lobes concerning for interstitial edema. No significant effusion. No acute bony abnormality. IMPRESSION: Borderline heart size. New interstitial prominence throughout the lungs concerning for interstitial edema. Electronically Signed   By: Rolm Baptise M.D.   On: 09/02/2016 11:18   Ct Angio Chest Pe W And/or Wo Contrast  Result Date: 09/02/2016 CLINICAL DATA:  Acute onset shortness of breath, hemoptysis. EXAM: CT ANGIOGRAPHY CHEST WITH CONTRAST TECHNIQUE: Multidetector CT imaging of the chest was performed using the standard protocol during bolus administration of intravenous contrast. Multiplanar CT image reconstructions and MIPs were obtained to evaluate the vascular anatomy. CONTRAST:  75 cc Isovue 300 IV COMPARISON:  Chest x-ray earlier today.  Chest CT 10/26/2015. FINDINGS: Cardiovascular: Heart is enlarged. Aorta is normal caliber. No filling defects in the pulmonary arteries to suggest pulmonary emboli. Scattered coronary artery calcifications. Mediastinum/Nodes: Small scattered borderline sized mediastinal lymph nodes. No axillary or hilar adenopathy. Lungs/Pleura: Dense airspace disease throughout both lower lobes. Patchy airspace disease also noted within the lingula, right middle lobe and inferior right upper lobe with ground-glass opacities throughout both upper lobes. This could  represent edema or infection. Small bilateral pleural effusions. Upper Abdomen: Imaging into the upper abdomen shows no acute findings. Nonobstructing small stone in the upper pole left kidney. Musculoskeletal: Chest wall soft tissues are unremarkable. No acute bony abnormality or focal bone lesion. Review of the MIP images confirms the above findings. IMPRESSION: S cardiomegaly, coronary artery disease. Bilateral airspace opacities diffusely throughout the lungs, most confluent in the lower lobes bilaterally. This could represent edema or infection. Borderline sized mesenteric lymph nodes, likely reactive or related to congestion. Small bilateral pleural effusions. Electronically Signed   By: Rolm Baptise M.D.   On: 09/02/2016 15:49   US Renal  Result Date: 09/03/2016 CLINICAL DATA:  Elevated creatinine. EXAM: RENAL / URINARY TRACT ULTRASOUND COMPLETE COMPARISON:  None. FINDINGS: Right Kidney: Length: 13.4 cm. A cortical thinning with increased renal cortical echogenicity noted. No hydronephrosis. 2.1 cm lesion identified upper pole. Left Kidney: Length: 13.0 cm. Increased renal cortical echogenicity with associated cortical thinning. No hydronephrosis. 15 mm hypoechoic lesion identified interpolar region. Bladder: Appears normal for degree of bladder distention. IMPRESSION: 1. No hydronephrosis. 2. Increased renal cortical echogenicity with thinning, compatible with medical renal disease. 3. Bilateral hypoechoic renal lesions, incompletely characterized. CT or MRI may prove helpful to further evaluate, as clinically warranted. Electronically Signed   By: Misty Stanley M.D.   On: 09/03/2016 23:00   Dg Chest Port 1 View  Result Date: 09/02/2016 CLINICAL DATA:  Productive cough and shortness of breath. Coughing up blood. EXAM: PORTABLE CHEST 1 VIEW COMPARISON:  09/02/2016 at 1051 hours FINDINGS: Again noted are prominent interstitial lung densities, particularly in the lower lungs. There may be mild  progression at the right lung base. Heart size remains upper limits of normal. Trachea is midline. There is new thickening or fluid along the right minor fissure. IMPRESSION: Prominent interstitial lung densities with increased densities at the right lung base. Findings are concerning for pulmonary edema. Electronically Signed  By: Markus Daft M.D.   On: 09/02/2016 14:14    Microbiology: Recent Results (from the past 240 hour(s))  Culture, blood (routine x 2) Call MD if unable to obtain prior to antibiotics being given     Status: None (Preliminary result)   Collection Time: 09/02/16  6:10 PM  Result Value Ref Range Status   Specimen Description BLOOD LEFT ANTECUBITAL  Final   Special Requests   Final    BOTTLES DRAWN AEROBIC AND ANAEROBIC BLUE 10CC RED 5CC   Culture NO GROWTH 4 DAYS  Final   Report Status PENDING  Incomplete  Culture, blood (routine x 2) Call MD if unable to obtain prior to antibiotics being given     Status: None (Preliminary result)   Collection Time: 09/02/16  6:15 PM  Result Value Ref Range Status   Specimen Description BLOOD LEFT HAND  Final   Special Requests BOTTLES DRAWN AEROBIC ONLY 5CC  Final   Culture NO GROWTH 4 DAYS  Final   Report Status PENDING  Incomplete  Urine culture     Status: None   Collection Time: 09/02/16  6:40 PM  Result Value Ref Range Status   Specimen Description URINE, CLEAN CATCH  Final   Special Requests NONE  Final   Culture NO GROWTH  Final   Report Status 09/04/2016 FINAL  Final  MRSA PCR Screening     Status: None   Collection Time: 09/03/16  6:09 AM  Result Value Ref Range Status   MRSA by PCR NEGATIVE NEGATIVE Final    Comment:        The GeneXpert MRSA Assay (FDA approved for NASAL specimens only), is one component of a comprehensive MRSA colonization surveillance program. It is not intended to diagnose MRSA infection nor to guide or monitor treatment for MRSA infections.   Respiratory Panel by PCR     Status: None    Collection Time: 09/03/16  1:16 PM  Result Value Ref Range Status   Adenovirus NOT DETECTED NOT DETECTED Final   Coronavirus 229E NOT DETECTED NOT DETECTED Final   Coronavirus HKU1 NOT DETECTED NOT DETECTED Final   Coronavirus NL63 NOT DETECTED NOT DETECTED Final   Coronavirus OC43 NOT DETECTED NOT DETECTED Final   Metapneumovirus NOT DETECTED NOT DETECTED Final   Rhinovirus / Enterovirus NOT DETECTED NOT DETECTED Final   Influenza A NOT DETECTED NOT DETECTED Final   Influenza B NOT DETECTED NOT DETECTED Final   Parainfluenza Virus 1 NOT DETECTED NOT DETECTED Final   Parainfluenza Virus 2 NOT DETECTED NOT DETECTED Final   Parainfluenza Virus 3 NOT DETECTED NOT DETECTED Final   Parainfluenza Virus 4 NOT DETECTED NOT DETECTED Final   Respiratory Syncytial Virus NOT DETECTED NOT DETECTED Final   Bordetella pertussis NOT DETECTED NOT DETECTED Final   Chlamydophila pneumoniae NOT DETECTED NOT DETECTED Final   Mycoplasma pneumoniae NOT DETECTED NOT DETECTED Final  Culture, expectorated sputum-assessment     Status: None (Preliminary result)   Collection Time: 09/05/16  9:09 AM  Result Value Ref Range Status   Specimen Description EXPECTORATED SPUTUM  Final   Special Requests NONE  Final   Sputum evaluation THIS SPECIMEN IS ACCEPTABLE FOR SPUTUM CULTURE  Final   Report Status PENDING  Incomplete     Labs: Basic Metabolic Panel:  Recent Labs Lab 09/02/16 1441 09/02/16 1630 09/03/16 0227 09/04/16 0231 09/05/16 0336 09/06/16 0240  NA 139 141 140 141 141 139  K 6.9* 5.0 4.3 4.0 3.4* 3.2*  CL  104 107 103 105 101 100*  CO2 26  --  23 28 32 29  GLUCOSE 192* 179* 234* 180* 141* 191*  BUN 15 22* 20 24* 18 20  CREATININE 1.49* 1.40* 1.66* 1.41* 1.46* 1.51*  CALCIUM 8.8*  --  8.4* 8.3* 7.9* 8.2*   Liver Function Tests:  Recent Labs Lab 09/02/16 1012 09/03/16 0227  AST 11 24  ALT 22 35  ALKPHOS 33* 36*  BILITOT 0.4 0.7  PROT 6.4 6.4*  ALBUMIN 3.3* 3.1*   No results for  input(s): LIPASE, AMYLASE in the last 168 hours. No results for input(s): AMMONIA in the last 168 hours. CBC:  Recent Labs Lab 09/02/16 1323  09/02/16 1630 09/03/16 0227 09/04/16 0231 09/05/16 0336 09/06/16 0240  WBC 16.0*  --   --  15.5* 19.3* 13.1* 13.3*  NEUTROABS 11.2*  --   --   --  15.4* 8.7*  --   HGB 15.1  < > 16.0 13.3 12.9* 13.2 13.7  HCT 47.9  < > 47.0 42.1 41.0 41.5 43.2  MCV 92.3  --   --  90.9 90.7 90.4 89.6  PLT 314  --   --  272 251 252 261  < > = values in this interval not displayed. Cardiac Enzymes: No results for input(s): CKTOTAL, CKMB, CKMBINDEX, TROPONINI in the last 168 hours. BNP: BNP (last 3 results)  Recent Labs  09/02/16 1040 09/02/16 1323 09/06/16 0240  BNP 147.8* 203.3* 58.3    ProBNP (last 3 results) No results for input(s): PROBNP in the last 8760 hours.  CBG:  Recent Labs Lab 09/05/16 0828 09/05/16 1158 09/05/16 1708 09/05/16 2109 09/06/16 0800  GLUCAP 115* 205* 315* 250* 164*       Signed:  Avneet Ashmore MD, PhD  [facility] 09/06/2016, 9:44 AM

## 2016-09-06 NOTE — Progress Notes (Signed)
Discharge instructions for  appointments and medications given to patient.  Patient verbalized understanding.  Informed that his prescription has been called to [place] at [facility].  Patient is in stable condition during this discharge.

## 2016-09-07 LAB — CULTURE, BLOOD (ROUTINE X 2)
Culture: NO GROWTH
Culture: NO GROWTH

## 2016-09-08 LAB — GLOMERULAR BASEMENT MEMBRANE ANTIBODIES: GBM AB: 3 U (ref 0–20)

## 2016-09-08 LAB — CULTURE, RESPIRATORY

## 2016-09-08 LAB — CULTURE, RESPIRATORY W GRAM STAIN

## 2016-09-09 ENCOUNTER — Encounter: Payer: Self-pay | Admitting: Family Medicine

## 2016-09-09 ENCOUNTER — Ambulatory Visit: Payer: Medicaid Other | Attending: Family Medicine | Admitting: Family Medicine

## 2016-09-09 VITALS — BP 163/98 | HR 77 | Temp 98.6°F | Ht 66.0 in | Wt 228.2 lb

## 2016-09-09 DIAGNOSIS — J9601 Acute respiratory failure with hypoxia: Secondary | ICD-10-CM | POA: Diagnosis not present

## 2016-09-09 DIAGNOSIS — I5043 Acute on chronic combined systolic (congestive) and diastolic (congestive) heart failure: Secondary | ICD-10-CM | POA: Insufficient documentation

## 2016-09-09 DIAGNOSIS — F5101 Primary insomnia: Secondary | ICD-10-CM | POA: Insufficient documentation

## 2016-09-09 DIAGNOSIS — I2510 Atherosclerotic heart disease of native coronary artery without angina pectoris: Secondary | ICD-10-CM | POA: Diagnosis not present

## 2016-09-09 DIAGNOSIS — Z79899 Other long term (current) drug therapy: Secondary | ICD-10-CM | POA: Diagnosis not present

## 2016-09-09 DIAGNOSIS — E118 Type 2 diabetes mellitus with unspecified complications: Secondary | ICD-10-CM

## 2016-09-09 DIAGNOSIS — G4733 Obstructive sleep apnea (adult) (pediatric): Secondary | ICD-10-CM | POA: Diagnosis not present

## 2016-09-09 DIAGNOSIS — Z794 Long term (current) use of insulin: Secondary | ICD-10-CM | POA: Insufficient documentation

## 2016-09-09 DIAGNOSIS — E119 Type 2 diabetes mellitus without complications: Secondary | ICD-10-CM | POA: Insufficient documentation

## 2016-09-09 DIAGNOSIS — Z7982 Long term (current) use of aspirin: Secondary | ICD-10-CM | POA: Diagnosis not present

## 2016-09-09 DIAGNOSIS — I10 Essential (primary) hypertension: Secondary | ICD-10-CM

## 2016-09-09 DIAGNOSIS — Z87891 Personal history of nicotine dependence: Secondary | ICD-10-CM | POA: Diagnosis not present

## 2016-09-09 DIAGNOSIS — Z7952 Long term (current) use of systemic steroids: Secondary | ICD-10-CM | POA: Diagnosis not present

## 2016-09-09 DIAGNOSIS — M109 Gout, unspecified: Secondary | ICD-10-CM | POA: Insufficient documentation

## 2016-09-09 DIAGNOSIS — I110 Hypertensive heart disease with heart failure: Secondary | ICD-10-CM | POA: Diagnosis not present

## 2016-09-09 DIAGNOSIS — G8929 Other chronic pain: Secondary | ICD-10-CM | POA: Insufficient documentation

## 2016-09-09 DIAGNOSIS — I5042 Chronic combined systolic (congestive) and diastolic (congestive) heart failure: Secondary | ICD-10-CM | POA: Insufficient documentation

## 2016-09-09 LAB — CBC
HEMATOCRIT: 43.6 % (ref 38.5–50.0)
Hemoglobin: 13.8 g/dL (ref 13.2–17.1)
MCH: 28.5 pg (ref 27.0–33.0)
MCHC: 31.7 g/dL — AB (ref 32.0–36.0)
MCV: 89.9 fL (ref 80.0–100.0)
MPV: 10.9 fL (ref 7.5–12.5)
PLATELETS: 259 10*3/uL (ref 140–400)
RBC: 4.85 MIL/uL (ref 4.20–5.80)
RDW: 16.4 % — AB (ref 11.0–15.0)
WBC: 10 10*3/uL (ref 3.8–10.8)

## 2016-09-09 LAB — BASIC METABOLIC PANEL WITH GFR
BUN: 21 mg/dL (ref 7–25)
CALCIUM: 9.6 mg/dL (ref 8.6–10.3)
CO2: 24 mmol/L (ref 20–31)
Chloride: 105 mmol/L (ref 98–110)
Creat: 1.42 mg/dL — ABNORMAL HIGH (ref 0.70–1.25)
GFR, EST AFRICAN AMERICAN: 61 mL/min (ref >=60)
GFR, EST NON AFRICAN AMERICAN: 53 mL/min — AB (ref >=60)
GLUCOSE: 192 mg/dL — AB (ref 65–99)
Potassium: 3.9 mmol/L (ref 3.5–5.3)
Sodium: 142 mmol/L (ref 135–146)

## 2016-09-09 LAB — ANCA TITERS: Atypical P-ANCA titer: <1:20 {titer}

## 2016-09-09 LAB — GLUCOSE, POCT (MANUAL RESULT ENTRY): POC Glucose: 192 mg/dl — AB (ref 70–99)

## 2016-09-09 MED ORDER — BLOOD PRESSURE KIT: 1.0000 | PACK | Freq: Two times a day (BID) | 0 refills | Status: DC

## 2016-09-09 MED ORDER — ESZOPICLONE 1 MG PO TABS: 1.0000 mg | ORAL_TABLET | Freq: Every day | ORAL | 1 refills | Status: DC

## 2016-09-09 MED ORDER — FUROSEMIDE 40 MG PO TABS: 40.0000 mg | ORAL_TABLET | Freq: Every day | ORAL | 3 refills | Status: DC

## 2016-09-09 NOTE — Assessment & Plan Note (Signed)
Improved Continue lasix Start bidil Checking CBC and BMP Keep appt with cardiology Home BP monitoring Stress importance of salt restriction

## 2016-09-09 NOTE — Assessment & Plan Note (Signed)
Chronic insomnia, known OSA, neuropathy Tried: trazodone, no improvement, elavil, intolerant, ambien slight improvement  Plan: Continue ambien Plan to transition to [place] once improved

## 2016-09-09 NOTE — Progress Notes (Signed)
Subjective:  Patient ID: Marco [name], male    DOB: 1955-10-11  Age: 61 y.o. MRN: [id number]  CC: Hospitalization Follow-up   HPI Marco [name] has diabetes, hx of chronic chest pain with minimal CAD (based on cath 11/2015), gout, psoriasis, arthralgia (on chronic prednisone 10 mg daily), chronic pain, OSA  presents for   1. Hospitalization follow up: he was hospitalized from 09/02/2016-09/06/2016 for acute respiratory failure. CXR revealed interstitial edema and ? R lung edema vs pneumonia. He was found to have reduced EF on repeat ECHO done 09/03/2016 with EF 45-50%, BNP was mildly elevated at 203 down to 58 on day of admission. His D dimer was negative. CT angiogram was obtained and revealed small b/l pleural effusions, negative for VTE. He was treated with IV Solumedrol, IV Vancomycin and Zosyn, IV lasix. BiPAP, then CPAP. He was treated in the stepdown unit. On day of discharge his O2 sats remains > 90 on RA at rest and with ambulation.   His elavil was discontinued due to concerns for TCA induced cardiomyopathy. His losartan dose was decreased due to AKI with hyperkalemia. His Cardizem was discontinued due to reduced EF. Lasix was restarted (note it was discontinued on 1//04/2016 due to no evidence of fluid overload), bidil prescribed.   Today, he report that he completed Augmentin. He has restarted lasix 40 mg daily. He has not yet started bidil plans to pick it up from pharmacy today. He continues to not take elavil. uloric was on his discharge paperwork but he did not start it. He denies CP or shortness of breath, cough or hemoptysis. He request Rx for home BP cuff. He reports that Marco [name] is not helping with insomnia. His pulmonologist attempted to order lunesta but prior authorization was not obtained.    Social History  Substance Use Topics  . Smoking status: Former Smoker    Packs/day: 0.50    Years: 10.00    Types: Cigarettes    Quit date: 08/12/2007  . Smokeless tobacco: Never Used    . Alcohol use No    Outpatient Medications Prior to Visit  Medication Sig Dispense Refill  . ACCU-CHEK AVIVA PLUS test strip USE ONE STRIP TO CHECK BLOOD GLUCOSE 3 TIMES DAILY 100 each 12  . ACCU-CHEK FASTCLIX LANCETS MISC 1 each by Other route 3 (three) times daily. 102 each 11  . ANDROGEL PUMP 20.25 MG/ACT (1.62%) GEL Apply 4 application topically every morning.  5  . atorvastatin (LIPITOR) 40 MG tablet TAKE ONE TABLET BY MOUTH ONCE DAILY 90 tablet 0  . BD PEN NEEDLE NANO U/F 32G X 4 MM MISC USE ONE PEN NEEDLE THREE TIMES DAILY 100 each 11  . Blood Glucose Monitoring Suppl (ACCU-CHEK AVIVA PLUS) W/DEVICE KIT 1 Device by Does not apply route 3 (three) times daily after meals. E11.9 1 kit 0  . carvedilol (COREG) 25 MG tablet Take 1 tablet (25 mg total) by mouth 2 (two) times daily. 60 tablet 0  . EQ ASPIRIN ADULT LOW DOSE 81 MG EC tablet TAKE ONE TABLET BY MOUTH ONCE DAILY 90 tablet 0  . febuxostat (ULORIC) 40 MG tablet Take 1 tablet (40 mg total) by mouth daily. 30 tablet 0  . glucose blood (ACCU-CHEK AVIVA PLUS) test strip 1 each by Other route 3 (three) times daily. E11.9 100 each 12  . HYDROcodone-acetaminophen (NORCO/VICODIN) 5-325 MG tablet Take 1 tablet by mouth every 8 (eight) hours as needed for moderate pain. Per pain management    . insulin aspart (  NOVOLOG) 100 UNIT/ML injection Inject 14-24 Units into the skin 3 (three) times daily with meals. Reported on 09/20/2015    . Ipratropium-Albuterol (COMBIVENT RESPIMAT) 20-100 MCG/ACT AERS respimat Inhale 1 puff into the lungs every 6 (six) hours. 4 g 12  . isosorbide-hydrALAZINE (BIDIL) 20-37.5 MG tablet Take 1 tablet by mouth 3 (three) times daily. 90 tablet 0  . LEVEMIR FLEXTOUCH 100 UNIT/ML Pen INJECT 60 UNITS SUBCUTANEOUSLY ONCE DAILY AT  10  PM 15 mL 3  . lidocaine (XYLOCAINE) 5 % ointment Apply 1 application topically 4 (four) times daily as needed. Apply to feet 35.44 g 2  . losartan (COZAAR) 100 MG tablet Take 0.5 tablets (50 mg  total) by mouth daily. 30 tablet 5  . metFORMIN (GLUCOPHAGE-XR) 500 MG 24 hr tablet Take 2 tablets (1,000 mg total) by mouth 2 (two) times daily. 120 tablet 5  . predniSONE (DELTASONE) 10 MG tablet Take 10 mg by mouth daily with breakfast.    . pregabalin (LYRICA) 100 MG capsule Take 1 capsule (100 mg total) by mouth 3 (three) times daily. 90 capsule 5  . ranitidine (ZANTAC) 150 MG tablet TAKE ONE TABLET BY MOUTH AT BEDTIME 90 tablet 0  . Secukinumab (COSENTYX) 150 MG/ML SOSY Inject 150 mg into the skin every 28 (twenty-eight) days.     [name] umeclidinium-vilanterol (ANORO ELLIPTA) 62.5-25 MCG/INH AEPB Inhale 1 puff into the lungs daily. 1 each 5  . vitamin B-12 (CYANOCOBALAMIN) 1000 MCG tablet Take 1 tablet (1,000 mcg total) by mouth daily. 30 tablet 3  . zolpidem (AMBIEN) 10 MG tablet Take 10 mg by mouth at bedtime as needed for sleep.     No facility-administered medications prior to visit.     ROS Review of Systems  Constitutional: Positive for appetite change (early satiety ). Negative for chills, fatigue, fever and unexpected weight change.  HENT: Positive for facial swelling and hearing loss.        Dry mouth   Eyes: Negative for visual disturbance.  Respiratory: Positive for cough and shortness of breath.   Cardiovascular: Negative for chest pain, palpitations and leg swelling.  Gastrointestinal: Positive for abdominal distention. Negative for abdominal pain, anal bleeding, blood in stool, constipation, diarrhea, nausea, rectal pain and vomiting.  Endocrine: Negative for polydipsia, polyphagia and polyuria.  Musculoskeletal: Negative for arthralgias, back pain, gait problem, joint swelling, myalgias and neck pain.  Skin: Negative for rash.  Allergic/Immunologic: Negative for immunocompromised state.  Neurological: Positive for numbness (hands and feet ). Negative for dizziness, weakness and headaches.  Hematological: Negative for adenopathy. Does not bruise/bleed easily.    Psychiatric/Behavioral: Positive for dysphoric mood. Negative for sleep disturbance and suicidal ideas. The patient is not nervous/anxious.     Objective:  BP (!) 163/98 (BP Location: Left Arm, Patient Position: Sitting, Cuff Size: Small)   Pulse 77   Temp 98.6 F (37 C) (Oral)   Ht '5\' 6"'$  (1.676 m)   Wt 228 lb 3.2 oz (103.5 kg)   SpO2 94%   BMI 36.83 kg/m   BP/Weight 09/09/2016 09/06/2016 3/00/9233  Systolic BP 007 622 -  Diastolic BP 98 97 -  Wt. (Lbs) 228.2 - 225.53  BMI 36.83 - -   Wt Readings from Last 3 Encounters:  09/09/16 228 lb 3.2 oz (103.5 kg)  09/03/16 225 lb 8.5 oz (102.3 kg)  09/02/16 240 lb 3.2 oz (109 kg)     Physical Exam  Constitutional: He appears well-developed and well-nourished. No distress.  HENT:  Head: Normocephalic  and atraumatic.  Mostly edentulous   Neck: Normal range of motion. Neck supple.  Cardiovascular: Normal rate, regular rhythm, normal heart sounds and intact distal pulses.   Pulmonary/Chest: Effort normal. No respiratory distress. He has no wheezes. He has no rales. He exhibits no tenderness.  Musculoskeletal:  Tenderness to light touch in hands and feet No swelling or erythema   Neurological: He is alert.  Skin: Skin is warm and dry. No rash noted. No erythema.   Lab Results  Component Value Date   HGBA1C 7.5 (H) 09/02/2016   CBG 192  CBC Latest Ref Rng & Units 09/06/2016 09/05/2016 09/04/2016  WBC 4.0 - 10.5 K/uL 13.3(H) 13.1(H) 19.3(H)  Hemoglobin 13.0 - 17.0 g/dL 13.7 13.2 12.9(L)  Hematocrit 39.0 - 52.0 % 43.2 41.5 41.0  Platelets 150 - 400 K/uL 261 252 251     Depression screen [facility] 2/9 09/09/2016 09/02/2016 08/19/2016  Decreased Interest 0 0 2  Down, Depressed, Hopeless '1 1 3  '$ PHQ - 2 Score '1 1 5  '$ Altered sleeping '3 3 3  '$ Tired, decreased energy '2 2 2  '$ Change in appetite 0 1 0  Feeling bad or failure about yourself  0 0 1  Trouble concentrating 0 1 0  Moving slowly or fidgety/restless 0 0 0  Suicidal thoughts 0 0 2   PHQ-9 Score '6 8 13  '$ Some recent data might be hidden   GAD 7 : Generalized Anxiety Score 09/09/2016 09/02/2016 08/19/2016 06/10/2016  Nervous, Anxious, on Edge 0 0 2 0  Control/stop worrying 0 0 3 0  Worry too much - different things '1 1 3 1  '$ Trouble relaxing '1 1 2 1  '$ Restless 0 0 0 0  Easily annoyed or irritable 0 0 0 0  Afraid - awful might happen 0 0 2 0  Total GAD 7 Score '2 2 12 2    '$ Assessment & Plan:   Marco [name] was seen today for hospitalization follow-up.  Diagnoses and all orders for this visit:  Acute respiratory failure with hypoxia (HCC) -     CBC -     BASIC METABOLIC PANEL WITH GFR -     furosemide (LASIX) 40 MG tablet; Take 1 tablet (40 mg total) by mouth daily.  Diabetes mellitus with complication (HCC) -     POCT glucose (manual entry)  Primary insomnia -     eszopiclone (LUNESTA) 1 MG TABS tablet; Take 1 tablet (1 mg total) by mouth at bedtime. Take immediately before bedtime  Essential hypertension -     Blood Pressure KIT; 1 each by Does not apply route 2 (two) times daily.  Acute on chronic combined systolic and diastolic heart failure (HCC) -     Blood Pressure KIT; 1 each by Does not apply route 2 (two) times daily.    No orders of the defined types were placed in this encounter.   Follow-up: Return in about 2 weeks (around 09/23/2016) for CHF .   Boykin Nearing MD

## 2016-09-09 NOTE — Patient Instructions (Addendum)
Marco [name] was seen today for hospitalization follow-up.  Diagnoses and all orders for this visit:  Acute respiratory failure with hypoxia (HCC) -     CBC -     BASIC METABOLIC PANEL WITH GFR -     furosemide (LASIX) 40 MG tablet; Take 1 tablet (40 mg total) by mouth daily.  Diabetes mellitus with complication (HCC) -     POCT glucose (manual entry)  Primary insomnia -     eszopiclone (LUNESTA) 1 MG TABS tablet; Take 1 tablet (1 mg total) by mouth at bedtime. Take immediately before bedtime  Essential hypertension -     Blood Pressure KIT; 1 each by Does not apply route 2 (two) times daily.  Acute on chronic combined systolic and diastolic heart failure (HCC) -     Blood Pressure KIT; 1 each by Does not apply route 2 (two) times daily.   F/u in 2 weeks for HTN and new onset of CHF  Dr. Adrian Blackwater

## 2016-09-10 ENCOUNTER — Ambulatory Visit (INDEPENDENT_AMBULATORY_CARE_PROVIDER_SITE_OTHER): Payer: Medicaid Other | Admitting: Diagnostic Neuroimaging

## 2016-09-10 ENCOUNTER — Encounter: Payer: Self-pay | Admitting: Diagnostic Neuroimaging

## 2016-09-10 VITALS — BP 110/65 | HR 83

## 2016-09-10 DIAGNOSIS — M0579 Rheumatoid arthritis with rheumatoid factor of multiple sites without organ or systems involvement: Secondary | ICD-10-CM

## 2016-09-10 DIAGNOSIS — R413 Other amnesia: Secondary | ICD-10-CM

## 2016-09-10 DIAGNOSIS — G8929 Other chronic pain: Secondary | ICD-10-CM | POA: Diagnosis not present

## 2016-09-10 DIAGNOSIS — G4700 Insomnia, unspecified: Secondary | ICD-10-CM | POA: Diagnosis not present

## 2016-09-10 DIAGNOSIS — E1142 Type 2 diabetes mellitus with diabetic polyneuropathy: Secondary | ICD-10-CM

## 2016-09-10 DIAGNOSIS — G44229 Chronic tension-type headache, not intractable: Secondary | ICD-10-CM

## 2016-09-10 NOTE — Progress Notes (Signed)
[facility]  PATIENT: Marco [name] DOB: 1956-01-06  REFERRING CLINICIAN: J Funches HISTORY FROM: patient  REASON FOR VISIT: new consult    HISTORICAL  CHIEF COMPLAINT:  Chief Complaint  Patient presents with  . Diabetic polyneuropathy    rm 6, New Pt, "headaches off and on for years-have had testing, loss of memory > 1 year"  . Memory Loss    HISTORY OF PRESENT ILLNESS:   61 year old male with hypertension, diabetes, heart disease, a Jeffrey ablation, depression, rheumatoid arthritis, CHF, MI, here for evaluation of memory loss, headaches, neuropathy.  For past 1-2 years patient has had worsening short-term memory loss, difficulty retaining new information, having delayed recall, having difficulty with conversations, having to take notes and set alarms. He has been using his phone to set alarms or reminders, make lists, stay organized and is now able to function a little bit better. Previously he was able to function as a daily basis without having to use so many organizing and reminder tools.  Patient also having intermittent sharp throbbing daily headaches. No nausea or vomiting. No sensitivity to light or sound.  Patient also has numbness and tingling and pain in his feet, related to diabetes and diabetic neuropathy. He had EMG nerve conduction study in June 2017 which confirms sensory neuropathy. Patient currently on Lyrica and hydrocodone for pain.   REVIEW OF SYSTEMS: Full 14 system review of systems performed and negative with exception of: Insomnia memory loss headache dizziness sleep apnea on CPAP depression not asleep aching muscles pain impotence shortness of breath chest pain trouble swallowing.  ALLERGIES: No Known Allergies  HOME MEDICATIONS: Outpatient Medications Prior to Visit  Medication Sig Dispense Refill  . ACCU-CHEK AVIVA PLUS test strip USE ONE STRIP TO CHECK BLOOD GLUCOSE 3 TIMES DAILY 100 each 12  . ACCU-CHEK FASTCLIX LANCETS  MISC 1 each by Other route 3 (three) times daily. 102 each 11  . ANDROGEL PUMP 20.25 MG/ACT (1.62%) GEL Apply 4 application topically every morning.  5  . atorvastatin (LIPITOR) 40 MG tablet TAKE ONE TABLET BY MOUTH ONCE DAILY 90 tablet 0  . BD PEN NEEDLE NANO U/F 32G X 4 MM MISC USE ONE PEN NEEDLE THREE TIMES DAILY 100 each 11  . Blood Glucose Monitoring Suppl (ACCU-CHEK AVIVA PLUS) W/DEVICE KIT 1 Device by Does not apply route 3 (three) times daily after meals. E11.9 1 kit 0  . Blood Pressure KIT 1 each by Does not apply route 2 (two) times daily. 1 each 0  . carvedilol (COREG) 25 MG tablet Take 1 tablet (25 mg total) by mouth 2 (two) times daily. 60 tablet 0  . EQ ASPIRIN ADULT LOW DOSE 81 MG EC tablet TAKE ONE TABLET BY MOUTH ONCE DAILY 90 tablet 0  . furosemide (LASIX) 40 MG tablet Take 1 tablet (40 mg total) by mouth daily. 30 tablet 3  . glucose blood (ACCU-CHEK AVIVA PLUS) test strip 1 each by Other route 3 (three) times daily. E11.9 100 each 12  . HYDROcodone-acetaminophen (NORCO/VICODIN) 5-325 MG tablet Take 1 tablet by mouth every 8 (eight) hours as needed for moderate pain. Per pain management    . insulin aspart (NOVOLOG) 100 UNIT/ML injection Inject 14-24 Units into the skin 3 (three) times daily with meals. Reported on 09/20/2015    . Ipratropium-Albuterol (COMBIVENT RESPIMAT) 20-100 MCG/ACT AERS respimat Inhale 1 puff into the lungs every 6 (six) hours. 4 g 12  . isosorbide-hydrALAZINE (BIDIL) 20-37.5 MG tablet Take 1 tablet by mouth  3 (three) times daily. 90 tablet 0  . LEVEMIR FLEXTOUCH 100 UNIT/ML Pen INJECT 60 UNITS SUBCUTANEOUSLY ONCE DAILY AT  10  PM 15 mL 3  . lidocaine (XYLOCAINE) 5 % ointment Apply 1 application topically 4 (four) times daily as needed. Apply to feet 35.44 g 2  . losartan (COZAAR) 100 MG tablet Take 0.5 tablets (50 mg total) by mouth daily. 30 tablet 5  . metFORMIN (GLUCOPHAGE-XR) 500 MG 24 hr tablet Take 2 tablets (1,000 mg total) by mouth 2 (two) times  daily. 120 tablet 5  . predniSONE (DELTASONE) 10 MG tablet Take 10 mg by mouth daily with breakfast.    . pregabalin (LYRICA) 100 MG capsule Take 1 capsule (100 mg total) by mouth 3 (three) times daily. 90 capsule 5  . ranitidine (ZANTAC) 150 MG tablet TAKE ONE TABLET BY MOUTH AT BEDTIME 90 tablet 0  . Secukinumab (COSENTYX) 150 MG/ML SOSY Inject 150 mg into the skin every 28 (twenty-eight) days.     [name] umeclidinium-vilanterol (ANORO ELLIPTA) 62.5-25 MCG/INH AEPB Inhale 1 puff into the lungs daily. 1 each 5  . vitamin B-12 (CYANOCOBALAMIN) 1000 MCG tablet Take 1 tablet (1,000 mcg total) by mouth daily. 30 tablet 3  . zolpidem (AMBIEN) 10 MG tablet Take 10 mg by mouth at bedtime as needed for sleep.    . eszopiclone (LUNESTA) 1 MG TABS tablet Take 1 tablet (1 mg total) by mouth at bedtime. Take immediately before bedtime (Patient not taking: Reported on 09/10/2016) 30 tablet 1   No facility-administered medications prior to visit.     PAST MEDICAL HISTORY: Past Medical History:  Diagnosis Date  . Benign colon polyp 08/01/2013   [facility] in [place]. large base tranverse colon polyp was biopsied. polyp was benign with minimal surface hyperplastic change.  . Chronic pain   . Diabetes mellitus without complication (HCC)   . Diverticulosis   . Esophageal hiatal hernia 07/29/2013   confirmed on EGD   . Esophageal stricture   . Essential hypertension   . Gastritis 07/29/2013   confirmed on EGD, bx done an negative for intestinal metaplasia, dsyplasia or H. pylori. normal gastric emptying study done 07/13/2013.  [name] GERD (gastroesophageal reflux disease)   . Morbid obesity (HCC)   . Non-obstructive CAD    a. 02/2013 Cath ([place]): nonobs dzs;  b. 09/2014 Myoview ([place]): EF 55%, no ischemia;  c. 12/2014 Echo: EF 50-55%, gr1 DD, no effusion.  [name] PAF (paroxysmal atrial fibrillation) (HCC)    a. 02/2013 s/p rfca in [place], [place]-->prev on Xarelto, d/c'd 2/2 anemia, ? GIB.  [name] Rheumatoid  arthritis (HCC)   . Sigmoid diverticulosis 08/01/2013   confirmed on colonscopy. record scanned into chart    PAST SURGICAL HISTORY: Past Surgical History:  Procedure Laterality Date  . CARDIAC CATHETERIZATION  05/2014   ablation for atrial fibrillation  . CARDIAC CATHETERIZATION N/A 11/16/2015   Procedure: Left Heart Cath and Coronary Angiography;  Surgeon: Marykay Lex, MD;  Location: [facility] INVASIVE CV LAB;  Service: Cardiovascular;  Laterality: N/A;  . COLON RESECTION  09/2013   due to large, abnormal polpy. non cancerous per patient.   . COLON SURGERY  09/2014   colon resection     FAMILY HISTORY: Family History  Problem Relation Age of Onset  . Hypertension Mother   . Diabetes Mother   . Cancer Mother   . COPD Mother   . Hypertension Father   . Diabetes Father   . Heart Problems  Father   . COPD Father     SOCIAL HISTORY:  Social History   Social History  . Marital status: Married    Spouse name: Amethyst  . Number of children: 8  . Years of education: 25   Occupational History  .      disabled   Social History Main Topics  . Smoking status: Former Smoker    Packs/day: 0.50    Years: 10.00    Types: Cigarettes    Quit date: 04/11/2008  . Smokeless tobacco: Never Used  . Alcohol use No  . Drug use: No  . Sexual activity: Not on file   Other Topics Concern  . Not on file   Social History Narrative   Lives with wife. Does not work.  On disability.    Caffeine- coffee, 1/2 cup daily     PHYSICAL EXAM  GENERAL EXAM/CONSTITUTIONAL: Vitals:  Vitals:   09/10/16 0932  BP: 110/65  Pulse: 83     There is no height or weight on file to calculate BMI.  Visual Acuity Screening   Right eye Left eye Both eyes  Without correction: 20/50 20/30   With correction:        Patient is in no distress; well developed, nourished and groomed; neck is supple  CARDIOVASCULAR:  Examination of carotid arteries is normal; no carotid bruits  Regular rate and  rhythm, no murmurs  Examination of peripheral vascular system by observation and palpation is normal  EYES:  Ophthalmoscopic exam of optic discs and posterior segments is normal; no papilledema or hemorrhages  MUSCULOSKELETAL:  Gait, strength, tone, movements noted in Neurologic exam below  NEUROLOGIC: MENTAL STATUS:  MMSE - Mini Mental State Exam 09/10/2016  Orientation to time 5  Orientation to Place 5  Registration 3  Attention/ Calculation 5  Recall 2  Language- name 2 objects 2  Language- repeat 0  Language- follow 3 step command 3  Language- read & follow direction 1  Write a sentence 1  Copy design 0  Total score 27    awake, alert, oriented to person, place and time  recent and remote memory intact; MISSES 1 ON RECALL  normal attention and concentration  language fluent, comprehension intact, naming intact, MISSES 1 ON REPETITION  fund of knowledge appropriate  CRANIAL NERVE:   2nd - no papilledema on fundoscopic exam  2nd, 3rd, 4th, 6th - pupils equal and reactive to light, visual fields full to confrontation, extraocular muscles intact, no nystagmus  5th - facial sensation symmetric  7th - facial strength symmetric  8th - hearing intact  9th - palate elevates symmetrically, uvula midline  11th - shoulder shrug symmetric  12th - tongue protrusion midline  MOTOR:   normal bulk and tone, full strength in the BUE, BLE  EXCEPT LIMITED IN BLE DUE TO PAIN  SENSORY:   normal and symmetric to light touch, temperature, vibration  EXCEPT DECR VIB AT TOES  COORDINATION:   finger-nose-finger, fine finger movements normal  REFLEXES:   deep tendon reflexes TRACE and symmetric  GAIT/STATION:   narrow based gait; ANTALGIC GAIT    DIAGNOSTIC DATA (LABS, IMAGING, TESTING) - I reviewed patient records, labs, notes, testing and imaging myself where available.  Lab Results  Component Value Date   WBC 10.0 09/09/2016   HGB 13.8 09/09/2016    HCT 43.6 09/09/2016   MCV 89.9 09/09/2016   PLT 259 09/09/2016      Component Value Date/Time   NA 142 09/09/2016  1037   K 3.9 09/09/2016 1037   CL 105 09/09/2016 1037   CO2 24 09/09/2016 1037   GLUCOSE 192 (H) 09/09/2016 1037   BUN 21 09/09/2016 1037   CREATININE 1.42 (H) 09/09/2016 1037   CALCIUM 9.6 09/09/2016 1037   PROT 6.4 (L) 09/03/2016 0227   ALBUMIN 3.1 (L) 09/03/2016 0227   AST 24 09/03/2016 0227   ALT 35 09/03/2016 0227   ALKPHOS 36 (L) 09/03/2016 0227   BILITOT 0.7 09/03/2016 0227   GFRNONAA 53 (L) 09/09/2016 1037   GFRAA 61 09/09/2016 1037   Lab Results  Component Value Date   CHOL 92 (L) 06/10/2016   HDL 35 (L) 06/10/2016   LDLCALC 39 06/10/2016   TRIG 91 06/10/2016   CHOLHDL 2.6 06/10/2016   Lab Results  Component Value Date   HGBA1C 7.5 (H) 09/02/2016   Lab Results  Component Value Date   [id number] 124 06/10/2016   Lab Results  Component Value Date   TSH 0.757 12/23/2014    01/22/16 CT head and cervical spine [I reviewed images myself and agree with interpretation. -VRP]  - No acute abnormality head or cervical spine. - No marked change in left maxillary and sphenoid sinus disease. - Atherosclerosis.     ASSESSMENT AND PLAN  61 y.o. year old male here with complaints of memory loss, neuropathy and headaches. Memory loss related to multiple factors. Diabetic neuropathy is confirmed and under treatment with pain management and diabetes control. Chronic daily headaches are likely tension headaches related to underlying problems with sleep, pain, medical issues.    Ddx memory loss: insomnia, sleep apnea, depression, chronic pain (RA)  Ddx neuropathy: diabetic neuropathy  Ddx headaches: sleep disorders, chronic pain, CHF, hypoxia  1. Diabetic polyneuropathy associated with type 2 diabetes mellitus ([place])   2. Memory loss   3. Insomnia, unspecified type   4. Other chronic pain   5. Rheumatoid arthritis involving multiple sites with positive  rheumatoid factor (HCC)   6. Chronic tension-type headache, not intractable      PLAN:  MEMORY LOSS - treat underlying factors (depression, sleep disorders, chronic pain) - brain healthy activities reviewed (movement, nutrition, sleep, relaxation techniques) - reviewed techniques to stay organized and improve day-day functioning and tasks  DIABETIC NEUROPATHY - continue lyrica and hydrocodone - consider adding on duloxetine (to help with neuropathy and depression) - consider adding alpha lipoic acid ('600mg'$  daily) - continue B12 vitamin support  CHRONIC DAILY HEADACHES / TENSION TYPE - treat underlying issues as above (sleep, depression, pain)  Return if symptoms worsen or fail to improve, for return to PCP.    Penni Bombard, MD 5/80/9983, 38:25 AM Certified in Neurology, Neurophysiology and Neuroimaging  [facility] [address], [place], [place] [address] [phone]

## 2016-09-10 NOTE — Patient Instructions (Signed)
Thank you for coming to see [place] at [facility]. I hope we have been able to provide you high quality care today.  You may receive a patient satisfaction survey over the next few weeks. We would appreciate your feedback and comments so that we may continue to improve ourselves and the health of our patients.   ~~~~~~~~~~~~~~~~~~~~~~~~~~~~~~~~~~~~~~~~~~~~~~~~~~~~~~~~~~~~~~~~~  DR. Kwesi Sangha'S GUIDE TO HAPPY AND HEALTHY LIVING These are some of my general health and wellness recommendations. Some of them may apply to you better than others. Please use common sense as you try these suggestions and feel free to ask me any questions.   ACTIVITY/FITNESS Mental, social, emotional and physical stimulation are very important for brain and body health. Try learning a new activity (arts, music, language, sports, games).  Keep moving your body to the best of your abilities. You can do this at home, inside or outside, the park, community center, gym or anywhere you like. Consider a physical therapist or personal trainer to get started. Consider the app Sworkit. Fitness trackers such as smart-watches, smart-phones or Fitbits can help as well.   NUTRITION Eat more plants: colorful vegetables, nuts, seeds and berries.  Eat less sugar, salt, preservatives and processed foods.  Avoid toxins such as cigarettes and alcohol.  Drink water when you are thirsty. Warm water with a slice of lemon is an excellent morning drink to start the day.  Consider these websites for more information The Nutrition Source (https://www.henry-hernandez.biz/) Precision Nutrition (WindowBlog.ch)   RELAXATION Consider practicing mindfulness meditation or other relaxation techniques such as deep breathing, prayer, yoga, tai chi, massage. See website mindful.org or the apps Headspace or Calm to help get started.   SLEEP Try to get at least 7-8+ hours sleep per day.  Regular exercise and reduced caffeine will help you sleep better. Practice good sleep hygeine techniques. See website sleep.org for more information.   PLANNING Prepare estate planning, living will, healthcare POA documents. Sometimes this is best planned with the help of an attorney. Theconversationproject.org and agingwithdignity.org are excellent resources.

## 2016-09-11 ENCOUNTER — Telehealth: Payer: Self-pay

## 2016-09-11 NOTE — Telephone Encounter (Signed)
Pt was called and informed of lab results. 

## 2016-09-13 ENCOUNTER — Encounter: Payer: Self-pay | Admitting: Internal Medicine

## 2016-09-16 ENCOUNTER — Other Ambulatory Visit: Payer: Self-pay | Admitting: Pharmacist

## 2016-09-16 ENCOUNTER — Ambulatory Visit (INDEPENDENT_AMBULATORY_CARE_PROVIDER_SITE_OTHER): Payer: Medicaid Other | Admitting: Internal Medicine

## 2016-09-16 ENCOUNTER — Telehealth: Payer: Self-pay | Admitting: Internal Medicine

## 2016-09-16 ENCOUNTER — Encounter: Payer: Self-pay | Admitting: Internal Medicine

## 2016-09-16 VITALS — BP 120/82 | HR 87 | Ht 66.0 in | Wt 221.4 lb

## 2016-09-16 DIAGNOSIS — I509 Heart failure, unspecified: Secondary | ICD-10-CM

## 2016-09-16 DIAGNOSIS — G4733 Obstructive sleep apnea (adult) (pediatric): Secondary | ICD-10-CM | POA: Diagnosis not present

## 2016-09-16 DIAGNOSIS — J189 Pneumonia, unspecified organism: Secondary | ICD-10-CM | POA: Diagnosis not present

## 2016-09-16 NOTE — Assessment & Plan Note (Signed)
Consider possibility of aspiration for culture negative for pneumonia at recent hospitalization. He should have a follow-up chest x-ray but not this soon. Suggest we get 1 in time for his visit with PCP and Cards in mid February.

## 2016-09-16 NOTE — Patient Instructions (Addendum)
Order- DME [facility]- Continue CPAP 16, mask of choice, humidifier, supplies, [employer]   DxOSA                    Please work with patient to change mask for better fit to minimize leak and improve comfort  Please call as needed  CXR future 09/22/16-   Pneumonia, CHF

## 2016-09-16 NOTE — Telephone Encounter (Signed)
CY came to triage and wanted [place] to contact the pt about the pt needing a CXR. Pt saw CY today. CY want the pt to have this done on 09/22/16. I attempted to contact pt but he did not answer the phone and I could not leave a message. Will try back later.

## 2016-09-16 NOTE — Progress Notes (Signed)
HPI male former smoker followed for OSA/ Insomnia, dyspnea on exertion, complicated by chronic pain, anxiety, DM 2, history cardiac ablation, gout NPSG 37.8/hour on 06/14/2015, desaturation to 76%, body weight 223 pounds BiPAP titration 08/19/2015- to 20/16 Barium Swallow 02/07/2016-normal CXR 01/22/2016-NAD PFT 02/22/2016-minimal restriction, mild diffusion deficit. FVC 2.62/76%, FEV1 2.14/81%, ratio 0.82, DLCO 61%, TLC 79%, RV/TLC 135%. 6MWT- 02/22/16-97%, 98%, 98%. Stopped after only 34 m complaining of chest tightness, headache, shortness of breath, dizziness. BP and heart rate were unremarkable. Unattended Home Sleep Test-05/25/2016-AHI 36.9/hour, desaturation to 82%, body weight 212 pounds 05/15/2016-61 year old male former smoker followed for OSA, dyspnea on exertion, complicated by chronic pain, DM 2, history cardiac ablation BIPAP 20/26   ----------------------------------------------------------------------------  05/30/2016-60 year old male former smoker followed for OSA/ Insomnia, dyspnea on exertion, complicated by chronic pain, anxiety, DM 2, history cardiac ablation BIPAP 20/16 Unattended Home Sleep Test-05/25/2016-AHI 36.9/hour, desaturation to 82%, body weight 212 pounds Prior auth request for Lunesta 3 mg FOLLOWS FOR: Pt here to review HST results. Pt never started Lunesta, still taking Ambien - issue with pharmacy. Pt states that Combivent was never sent to pharmacy.  Declines flu vaccine Says he is using BiPAP but mask leaks that he doesn't like fullface mask. Still complains of dyspnea on exertion. We reviewed the unremarkable lab tests so far including cardiac cath.  09/16/2016-61 year old male former smoker followed for OSA/ Insomnia, dyspnea on exertion, complicated by chronic pain, anxiety, DM 2, history cardiac ablation, gout CPAP 16/ [facility] 4 mo f/u for OSA. Was in the hospital on 1/23 for shortness of breath. Feels a little better now.  [facility]  1/23-1/27/18-acute hypoxic respiratory failure, CAP (cx neg) and CHF, treated with Solu-Medrol, Rocephin/Zithromax Has cardiology follow-up appointment this month with [name]. Breathing nearly back to normal-still a little more dyspneic than usual with exertion. No longer coughing, no phlegm. We reviewed CPAP download showing missed days including recent hospital stay and emphasizing benefits of using CPAP all night every night CTa chest 09/02/2016 IMPRESSION: S cardiomegaly, coronary artery disease. Bilateral airspace opacities diffusely throughout the lungs, most confluent in the lower lobes bilaterally. This could represent edema or infection. Borderline sized mesenteric lymph nodes, likely reactive or related to congestion. Small bilateral pleural effusions  ROS-see HPI   Negative unless "+" Constitutional:    weight loss, night sweats, fevers, chills, fatigue, lassitude. HEENT:    headaches, + difficulty swallowing, tooth/dental problems, sore throat,       sneezing, itching, ear ache, nasal congestion, post nasal drip, snoring CV:     chest pain, orthopnea, PND, + swelling in lower extremities, anasarca,                                                          dizziness, palpitations Resp:  + shortness of breath with exertion or at rest.                productive cough,   non-productive cough, coughing up of blood.              change in color of mucus.  wheezing.   Skin:    rash or lesions. GI:  No-   heartburn, indigestion, + abdominal pain, nausea, vomiting, diarrhea,                 change in  bowel habits, loss of appetite GU: dysuria, change in color of urine, no urgency or frequency.   flank pain. MS:   + joint pain, stiffness, decreased range of motion, back pain. Neuro-     nothing unusual Psych:  change in mood or affect.  depression or anxiety.   memory loss.  OBJ- Physical Exam General- Alert, Oriented, Affect-appropriate, Distress- none acute Skin- rash-none,  lesions- none, excoriation- none Lymphadenopathy- none Head- atraumatic            Eyes- Gross vision intact, PERRLA, conjunctivae and secretions clear            Ears- Hearing, canals-normal            Nose- Clear, no-Septal dev, mucus, polyps, erosion, perforation             Throat- Mallampati IV , mucosa clear , drainage- none, tonsils- atrophic, +many missing teeth Neck- flexible , trachea midline, no stridor , thyroid nl, carotid no bruit Chest - symmetrical excursion , unlabored           Heart/CV- RRR , no murmur , no gallop  , no rub, nl s1 s2                           - JVD- none , edema- none, stasis changes- none, varices- none           Lung- clear to P&A, wheeze- none, cough- none , dullness-none, rub- none           Chest wall-  Abd-  Br/ Gen/ Rectal- Not done, not indicated Extrem- cyanosis- none, clubbing, none, atrophy- none, strength- nl Neuro- grossly intact to observation

## 2016-09-17 NOTE — Telephone Encounter (Signed)
lmtcb

## 2016-09-17 NOTE — Telephone Encounter (Signed)
Pt returning call.Marco [name] ° °

## 2016-09-17 NOTE — Telephone Encounter (Signed)
LMTCB

## 2016-09-17 NOTE — Telephone Encounter (Signed)
Pt aware of CY's recommendations and voiced his understanding. Nothing further needed.  

## 2016-09-18 ENCOUNTER — Ambulatory Visit (INDEPENDENT_AMBULATORY_CARE_PROVIDER_SITE_OTHER)
Admission: RE | Admit: 2016-09-18 | Discharge: 2016-09-18 | Disposition: A | Discharge status: Home / Self Care | Payer: Medicaid Other | Source: Ambulatory Visit | Attending: Internal Medicine | Admitting: Internal Medicine

## 2016-09-18 DIAGNOSIS — J189 Pneumonia, unspecified organism: Secondary | ICD-10-CM | POA: Diagnosis not present

## 2016-09-18 DIAGNOSIS — I509 Heart failure, unspecified: Secondary | ICD-10-CM

## 2016-09-19 ENCOUNTER — Encounter: Payer: Self-pay | Admitting: Cardiology

## 2016-09-23 ENCOUNTER — Encounter: Payer: Self-pay | Admitting: Family Medicine

## 2016-09-23 ENCOUNTER — Ambulatory Visit: Payer: Medicaid Other | Attending: Family Medicine | Admitting: Family Medicine

## 2016-09-23 VITALS — BP 111/70 | HR 74 | Temp 97.8°F | Ht 66.0 in | Wt 231.4 lb

## 2016-09-23 DIAGNOSIS — Z7952 Long term (current) use of systemic steroids: Secondary | ICD-10-CM | POA: Diagnosis not present

## 2016-09-23 DIAGNOSIS — I5043 Acute on chronic combined systolic (congestive) and diastolic (congestive) heart failure: Secondary | ICD-10-CM | POA: Insufficient documentation

## 2016-09-23 DIAGNOSIS — Z79899 Other long term (current) drug therapy: Secondary | ICD-10-CM | POA: Diagnosis not present

## 2016-09-23 DIAGNOSIS — G8929 Other chronic pain: Secondary | ICD-10-CM | POA: Insufficient documentation

## 2016-09-23 DIAGNOSIS — Z87891 Personal history of nicotine dependence: Secondary | ICD-10-CM | POA: Insufficient documentation

## 2016-09-23 DIAGNOSIS — Z794 Long term (current) use of insulin: Secondary | ICD-10-CM | POA: Insufficient documentation

## 2016-09-23 DIAGNOSIS — R079 Chest pain, unspecified: Secondary | ICD-10-CM | POA: Diagnosis not present

## 2016-09-23 DIAGNOSIS — I2510 Atherosclerotic heart disease of native coronary artery without angina pectoris: Secondary | ICD-10-CM | POA: Insufficient documentation

## 2016-09-23 DIAGNOSIS — I110 Hypertensive heart disease with heart failure: Secondary | ICD-10-CM | POA: Diagnosis not present

## 2016-09-23 DIAGNOSIS — M109 Gout, unspecified: Secondary | ICD-10-CM | POA: Insufficient documentation

## 2016-09-23 DIAGNOSIS — G4733 Obstructive sleep apnea (adult) (pediatric): Secondary | ICD-10-CM | POA: Insufficient documentation

## 2016-09-23 DIAGNOSIS — Z7982 Long term (current) use of aspirin: Secondary | ICD-10-CM | POA: Diagnosis not present

## 2016-09-23 DIAGNOSIS — E119 Type 2 diabetes mellitus without complications: Secondary | ICD-10-CM | POA: Insufficient documentation

## 2016-09-23 LAB — GLUCOSE, POCT (MANUAL RESULT ENTRY): POC GLUCOSE: 143 mg/dL — AB (ref 70–99)

## 2016-09-23 MED ORDER — IPRATROPIUM-ALBUTEROL 20-100 MCG/ACT IN AERS: 1.0000 | INHALATION_SPRAY | Freq: Four times a day (QID) | RESPIRATORY_TRACT | 12 refills | Status: DC

## 2016-09-23 MED ORDER — FUROSEMIDE 40 MG PO TABS: 40.0000 mg | ORAL_TABLET | Freq: Every day | ORAL | 3 refills | Status: DC

## 2016-09-23 NOTE — Assessment & Plan Note (Signed)
Normal BP Weight is up Patient feels shortness of breath  Plan: Increase lasix to 40 mg BID until he obtains dry weight

## 2016-09-23 NOTE — Patient Instructions (Addendum)
Marco [name] was seen today for hypertension and congestive heart failure.  Diagnoses and all orders for this visit:  Type 2 diabetes mellitus without complication, with long-term current use of insulin (HCC) -     POCT glucose (manual entry)  Acute respiratory failure with hypoxia (HCC)  Acute on chronic combined systolic and diastolic heart failure (HCC) -     furosemide (LASIX) 40 MG tablet; Take 1 tablet (40 mg total) by mouth daily. Take an extra 40 mg dose if weight up 3 lbs from previous day  Other orders -     Ipratropium-Albuterol (COMBIVENT RESPIMAT) 20-100 MCG/ACT AERS respimat; Inhale 1 puff into the lungs every 6 (six) hours.  use combivent inhaler with mucinex to help move out congestion  Dry weight is 225 lbs if you you can over 3 lbs from day to day take an extra dose of lasix  Diabetes blood sugar goals  Fasting (in AM before breakfast, 8 hrs of no eating or drinking (except water or unsweetened coffee or tea): 90-130 2 hrs after meals: < 160,   No low sugars: nothing < 70   F/u in 4 weeks for HTN and diabetes   Dr. Adrian Blackwater

## 2016-09-23 NOTE — Progress Notes (Signed)
Subjective:  Patient ID: Marco [name], male    DOB: 1955/12/04  Age: 61 y.o. MRN: [id number]  CC: Hypertension and Congestive Heart Failure   HPI Marco [name] has diabetes, hx of chronic chest pain with minimal CAD (based on cath 11/2015), gout, psoriasis, arthralgia (on chronic prednisone 10 mg daily), chronic pain, OSA  presents for   1. HTN: compliant with regimen. No leg swelling. Having some shortness of breath with exertion. Some chest tightening. Tightening in chest is exacerbated by taking. He also has congestion. He is taking mucinex which is helping.   2. Diabetes: taking novolog 3 times a day based on sliding scale. Fasting sugars 80-90. Post prandial sugars 120-130.    Social History  Substance Use Topics  . Smoking status: Former Smoker    Packs/day: 0.50    Years: 10.00    Types: Cigarettes    Quit date: 04/11/2008  . Smokeless tobacco: Never Used  . Alcohol use No    Outpatient Medications Prior to Visit  Medication Sig Dispense Refill  . ACCU-CHEK AVIVA PLUS test strip USE ONE STRIP TO CHECK BLOOD GLUCOSE 3 TIMES DAILY 100 each 12  . ACCU-CHEK FASTCLIX LANCETS MISC 1 each by Other route 3 (three) times daily. 102 each 11  . atorvastatin (LIPITOR) 40 MG tablet TAKE ONE TABLET BY MOUTH ONCE DAILY 90 tablet 0  . BD PEN NEEDLE NANO U/F 32G X 4 MM MISC USE ONE PEN NEEDLE THREE TIMES DAILY 100 each 11  . Blood Glucose Monitoring Suppl (ACCU-CHEK AVIVA PLUS) W/DEVICE KIT 1 Device by Does not apply route 3 (three) times daily after meals. E11.9 1 kit 0  . Blood Pressure KIT 1 each by Does not apply route 2 (two) times daily. 1 each 0  . carvedilol (COREG) 25 MG tablet Take 1 tablet (25 mg total) by mouth 2 (two) times daily. 60 tablet 0  . EQ ASPIRIN ADULT LOW DOSE 81 MG EC tablet TAKE ONE TABLET BY MOUTH ONCE DAILY 90 tablet 0  . eszopiclone (LUNESTA) 1 MG TABS tablet Take 1 tablet (1 mg total) by mouth at bedtime. Take immediately before bedtime 30 tablet 1  .  furosemide (LASIX) 40 MG tablet Take 1 tablet (40 mg total) by mouth daily. 30 tablet 3  . glucose blood (ACCU-CHEK AVIVA PLUS) test strip 1 each by Other route 3 (three) times daily. E11.9 100 each 12  . HYDROcodone-acetaminophen (NORCO/VICODIN) 5-325 MG tablet Take 1 tablet by mouth every 8 (eight) hours as needed for moderate pain. Per pain management    . insulin aspart (NOVOLOG) 100 UNIT/ML injection Inject 14-24 Units into the skin 3 (three) times daily with meals. Reported on 09/20/2015    . Ipratropium-Albuterol (COMBIVENT RESPIMAT) 20-100 MCG/ACT AERS respimat Inhale 1 puff into the lungs every 6 (six) hours. 4 g 12  . isosorbide-hydrALAZINE (BIDIL) 20-37.5 MG tablet Take 1 tablet by mouth 3 (three) times daily. 90 tablet 0  . LEVEMIR FLEXTOUCH 100 UNIT/ML Pen INJECT 60 UNITS SUBCUTANEOUSLY ONCE DAILY AT  10  PM 15 mL 3  . lidocaine (XYLOCAINE) 5 % ointment Apply 1 application topically 4 (four) times daily as needed. Apply to feet 35.44 g 2  . losartan (COZAAR) 100 MG tablet Take 0.5 tablets (50 mg total) by mouth daily. 30 tablet 5  . metFORMIN (GLUCOPHAGE-XR) 500 MG 24 hr tablet Take 2 tablets (1,000 mg total) by mouth 2 (two) times daily. 120 tablet 5  . predniSONE (DELTASONE) 10 MG tablet Take  10 mg by mouth daily with breakfast.    . pregabalin (LYRICA) 100 MG capsule Take 1 capsule (100 mg total) by mouth 3 (three) times daily. 90 capsule 5  . ranitidine (ZANTAC) 150 MG tablet TAKE ONE TABLET BY MOUTH AT BEDTIME 90 tablet 0  . Secukinumab (COSENTYX) 150 MG/ML SOSY Inject 150 mg into the skin every 28 (twenty-eight) days.     [name] umeclidinium-vilanterol (ANORO ELLIPTA) 62.5-25 MCG/INH AEPB Inhale 1 puff into the lungs daily. 1 each 5  . vitamin B-12 (CYANOCOBALAMIN) 1000 MCG tablet Take 1 tablet (1,000 mcg total) by mouth daily. 30 tablet 3  . ANDROGEL PUMP 20.25 MG/ACT (1.62%) GEL Apply 4 application topically every morning.  5  . zolpidem (AMBIEN) 10 MG tablet Take 10 mg by mouth at  bedtime as needed for sleep.     No facility-administered medications prior to visit.     ROS Review of Systems  Constitutional: Positive for appetite change (early satiety ). Negative for chills, fatigue, fever and unexpected weight change.  HENT: Positive for facial swelling and hearing loss.        Dry mouth   Eyes: Negative for visual disturbance.  Respiratory: Positive for cough and shortness of breath.   Cardiovascular: Negative for chest pain, palpitations and leg swelling.  Gastrointestinal: Positive for abdominal distention. Negative for abdominal pain, anal bleeding, blood in stool, constipation, diarrhea, nausea, rectal pain and vomiting.  Endocrine: Negative for polydipsia, polyphagia and polyuria.  Musculoskeletal: Negative for arthralgias, back pain, gait problem, joint swelling, myalgias and neck pain.  Skin: Negative for rash.  Allergic/Immunologic: Negative for immunocompromised state.  Neurological: Positive for numbness (hands and feet ). Negative for dizziness, weakness and headaches.  Hematological: Negative for adenopathy. Does not bruise/bleed easily.  Psychiatric/Behavioral: Positive for dysphoric mood. Negative for sleep disturbance and suicidal ideas. The patient is not nervous/anxious.     Objective:  BP 111/70 (BP Location: Left Arm, Patient Position: Sitting, Cuff Size: Small)   Pulse 74   Temp 97.8 F (36.6 C) (Oral)   Ht _0  (1.676 m)   Wt 231 lb 6.4 oz (105 kg)   SpO2 95%   BMI 37.35 kg/m   BP/Weight 09/23/2016 09/16/2016 3/54/6568  Systolic BP 127 517 001  Diastolic BP 70 82 65  Wt. (Lbs) 231.4 221.4 -  BMI 37.35 35.73 -   Wt Readings from Last 3 Encounters:  09/23/16 231 lb 6.4 oz (105 kg)  09/16/16 221 lb 6.4 oz (100.4 kg)  09/09/16 228 lb 3.2 oz (103.5 kg)     Physical Exam  Constitutional: He appears well-developed and well-nourished. No distress.  HENT:  Head: Normocephalic and atraumatic.  Mostly edentulous   Neck: Normal range  of motion. Neck supple.  Cardiovascular: Normal rate, regular rhythm, normal heart sounds and intact distal pulses.   Pulmonary/Chest: Effort normal. No respiratory distress. He has no wheezes. He has no rales. He exhibits no tenderness.  Musculoskeletal: He exhibits no edema.  Neurological: He is alert.  Skin: Skin is warm and dry. No rash noted. No erythema.   Lab Results  Component Value Date   HGBA1C 7.5 (H) 09/02/2016   CBG 143 CBC Latest Ref Rng & Units 09/09/2016 09/06/2016 09/05/2016  WBC 3.8 - 10.8 K/uL 10.0 13.3(H) 13.1(H)  Hemoglobin 13.2 - 17.1 g/dL 13.8 13.7 13.2  Hematocrit 38.5 - 50.0 % 43.6 43.2 41.5  Platelets 140 - 400 K/uL 259 261 252     Depression screen [facility] 2/9 09/23/2016 09/09/2016  09/02/2016  Decreased Interest 0 0 0  Down, Depressed, Hopeless _0 PHQ - 2 Score _1 Altered sleeping _2 Tired, decreased energy _3 Change in appetite 0 0 1  Feeling bad or failure about yourself  0 0 0  Trouble concentrating 0 0 1  Moving slowly or fidgety/restless 0 0 0  Suicidal thoughts 0 0 0  PHQ-9 Score _4 Some recent data might be hidden   GAD 7 : Generalized Anxiety Score 09/23/2016 09/09/2016 09/02/2016 08/19/2016  Nervous, Anxious, on Edge 0 0 0 2  Control/stop worrying 2 0 0 3  Worry too much - different things _5 Trouble relaxing _6 Restless 0 0 0 0  Easily annoyed or irritable 0 0 0 0  Afraid - awful might happen 0 0 0 2  Total GAD 7 Score _7 Assessment & Plan:   Marco [name] was seen today for hypertension and congestive heart failure.  Diagnoses and all orders for this visit:  Type 2 diabetes mellitus without complication, with long-term current use of insulin (HCC) -     POCT glucose (manual entry)  Acute on chronic combined systolic and diastolic heart failure (HCC) -     furosemide (LASIX) 40 MG tablet; Take 1 tablet (40 mg total) by mouth daily. Take an extra 40 mg dose if weight up 3 lbs from previous day  Other  orders -     Ipratropium-Albuterol (COMBIVENT RESPIMAT) 20-100 MCG/ACT AERS respimat; Inhale 1 puff into the lungs every 6 (six) hours.    No orders of the defined types were placed in this encounter.   Follow-up: Return in about 4 weeks (around 10/21/2016) for CHF/ HTN .   Boykin Nearing MD

## 2016-09-23 NOTE — Assessment & Plan Note (Signed)
Controlled CBGs at goal Continue current regimen

## 2016-09-25 ENCOUNTER — Ambulatory Visit (INDEPENDENT_AMBULATORY_CARE_PROVIDER_SITE_OTHER): Payer: Medicaid Other | Admitting: Cardiology

## 2016-09-25 ENCOUNTER — Encounter: Payer: Self-pay | Admitting: Cardiology

## 2016-09-25 ENCOUNTER — Telehealth: Payer: Self-pay | Admitting: Internal Medicine

## 2016-09-25 VITALS — BP 128/62 | HR 84 | Ht 66.0 in | Wt 227.0 lb

## 2016-09-25 DIAGNOSIS — R0600 Dyspnea, unspecified: Secondary | ICD-10-CM | POA: Diagnosis not present

## 2016-09-25 LAB — BASIC METABOLIC PANEL
BUN/Creatinine Ratio: 13 (ref 10–24)
BUN: 21 mg/dL (ref 8–27)
CALCIUM: 9 mg/dL (ref 8.6–10.2)
CO2: 29 mmol/L (ref 18–29)
Chloride: 102 mmol/L (ref 96–106)
Creatinine, Ser: 1.58 mg/dL — ABNORMAL HIGH (ref 0.76–1.27)
GFR calc Af Amer: 54 mL/min/{1.73_m2} — ABNORMAL LOW (ref >59)
GFR, EST NON AFRICAN AMERICAN: 47 mL/min/{1.73_m2} — AB (ref >59)
Glucose: 139 mg/dL — ABNORMAL HIGH (ref 65–99)
Potassium: 3.9 mmol/L (ref 3.5–5.2)
Sodium: 147 mmol/L — ABNORMAL HIGH (ref 134–144)

## 2016-09-25 LAB — PRO B NATRIURETIC PEPTIDE: NT-Pro BNP: 208 pg/mL (ref 0–210)

## 2016-09-25 NOTE — Patient Instructions (Addendum)
Medication Instructions:  Your physician recommends that you continue on your current medications as directed. Please refer to the Current Medication list given to you today.   Labwork: LABS TODAY: PRO BNP, BMET  Testing/Procedures: NONE ORDERED  Follow-Up: Your physician recommends that you schedule a follow-up appointment in: 3 MONTHS WITH DR TURNER.   Any Other Special Instructions Will Be Listed Below (If Applicable).  1. LOW SODIUM DIET. 2. WEIGH YOURSELF DAILY- IF YOU GAIN MORE THAN 3 POUNDS IN 24 HOURS OR IF YOU GAIN MORE THAN 5 POUNDS IN 1 WEEK, THEN TAKE AN EXTRA DOSE OF FUROSEMIDE (LASIX) 40 MG.  If you need a refill on your cardiac medications before your next appointment, please call your pharmacy.

## 2016-09-25 NOTE — Progress Notes (Signed)
09/25/2016 Dionicia Abler   08-03-1956  [id number]  Primary Physician Lora Paula, MD Primary Cardiologist: Dr. Mayford Knife    Reason for Visit/CC: [facility] F/u for Acute on Chronic Systolic CHF  HPI:  Mr. Whisenant is a 61 year old male with a past medical history of DM, HTN, non-obstructive CAD and PAF. He presented to the ED on 09/02/16 with acute on chronic systolic CHF. Of note, pt had an Echo from May 2016 showed an LVEF of 50-55% and grade 1 diastolic dysfunction. He had a left heart cath on 11/16/15 for exertional chest pain and SOB, his LVEDP was 5 at that time (he was on 40mg  daily Lasix). His LVEF by cath was 55-65%, and he had essentially normal coronary angiography.    Pt presented to the ED on 1/23 with complaint of an 11 lb weight gain + exertional dyspnea. This occurred after he had stopped his home lasix. He was found to be volume overloaded and in acute CHF. Echo was performed which showed an EF of 45-50% with diffuse hypokinesis. EKG showed NSR with low voltage QRS in the precordial leads.  Chest x ray showed pulmonary edema vs. PNA. He was admitted by IM and placed on antibiotics and IV lasix. His Cardizem was discontinued. Coreg was increased and bidil added. He was continued on Cozaar. He was diuresed and condition improved. His dyspnea resolved. He was discharged home on 40 mg of daily lasix and instructed to take an extra dose if weight gain of > 3lb in 24 hrs.  He presents to clinic today for f/u BP is well controlled at 128/62. Pt states his BP has been at its best since starting Bidil. His BP has not gone over 130 systolic since starting it. His O2 sats are stable at 97% on RA. He reports full compliance with Lasix but has not been checking weight at home daily. Base on our scales, his weight is fairly stable since discharge at 227 lb today (225 in hospital). He still feels a bit short of breath however with exertion. No resting dyspnea. He reports avoidance of high sodium  foods.    Current Meds  Medication Sig  . ACCU-CHEK AVIVA PLUS test strip USE ONE STRIP TO CHECK BLOOD GLUCOSE 3 TIMES DAILY  . ACCU-CHEK FASTCLIX LANCETS MISC 1 each by Other route 3 (three) times daily.  . ANDROGEL PUMP 20.25 MG/ACT (1.62%) GEL Apply 4 application topically every morning.  2/23 atorvastatin (LIPITOR) 40 MG tablet TAKE ONE TABLET BY MOUTH ONCE DAILY  . BD PEN NEEDLE NANO U/F 32G X 4 MM MISC USE ONE PEN NEEDLE THREE TIMES DAILY  . Blood Glucose Monitoring Suppl (ACCU-CHEK AVIVA PLUS) W/DEVICE KIT 1 Device by Does not apply route 3 (three) times daily after meals. E11.9  . Blood Pressure KIT 1 each by Does not apply route 2 (two) times daily.  . carvedilol (COREG) 25 MG tablet Take 1 tablet (25 mg total) by mouth 2 (two) times daily.  . EQ ASPIRIN ADULT LOW DOSE 81 MG EC tablet TAKE ONE TABLET BY MOUTH ONCE DAILY  . eszopiclone (LUNESTA) 1 MG TABS tablet Take 1 tablet (1 mg total) by mouth at bedtime. Take immediately before bedtime  . furosemide (LASIX) 40 MG tablet Take 1 tablet (40 mg total) by mouth daily. Take an extra 40 mg dose if weight up 3 lbs from previous day  . glucose blood (ACCU-CHEK AVIVA PLUS) test strip 1 each by Other route 3 (three) times  daily. E11.9  . HYDROcodone-acetaminophen (NORCO/VICODIN) 5-325 MG tablet Take 1 tablet by mouth every 8 (eight) hours as needed for moderate pain. Per pain management  . insulin aspart (NOVOLOG) 100 UNIT/ML injection Inject 14-24 Units into the skin 3 (three) times daily with meals. Reported on 09/20/2015  . Ipratropium-Albuterol (COMBIVENT RESPIMAT) 20-100 MCG/ACT AERS respimat Inhale 1 puff into the lungs every 6 (six) hours.  . isosorbide-hydrALAZINE (BIDIL) 20-37.5 MG tablet Take 1 tablet by mouth 3 (three) times daily.  [name] LEVEMIR FLEXTOUCH 100 UNIT/ML Pen INJECT 60 UNITS SUBCUTANEOUSLY ONCE DAILY AT  10  PM  . lidocaine (XYLOCAINE) 5 % ointment Apply 1 application topically 4 (four) times daily as needed. Apply to feet  .  losartan (COZAAR) 100 MG tablet Take 0.5 tablets (50 mg total) by mouth daily.  . metFORMIN (GLUCOPHAGE-XR) 500 MG 24 hr tablet Take 2 tablets (1,000 mg total) by mouth 2 (two) times daily.  . predniSONE (DELTASONE) 10 MG tablet Take 10 mg by mouth daily with breakfast.  . pregabalin (LYRICA) 100 MG capsule Take 1 capsule (100 mg total) by mouth 3 (three) times daily.  . ranitidine (ZANTAC) 150 MG tablet TAKE ONE TABLET BY MOUTH AT BEDTIME  . Secukinumab (COSENTYX) 150 MG/ML SOSY Inject 150 mg into the skin every 28 (twenty-eight) days.   [name] umeclidinium-vilanterol (ANORO ELLIPTA) 62.5-25 MCG/INH AEPB Inhale 1 puff into the lungs daily.  . vitamin B-12 (CYANOCOBALAMIN) 1000 MCG tablet Take 1 tablet (1,000 mcg total) by mouth daily.   No Known Allergies Past Medical History:  Diagnosis Date  . Benign colon polyp 08/01/2013   [facility] in [place]. large base tranverse colon polyp was biopsied. polyp was benign with minimal surface hyperplastic change.  . Chronic pain   . Diabetes mellitus without complication (HCC)   . Diverticulosis   . Esophageal hiatal hernia 07/29/2013   confirmed on EGD   . Esophageal stricture   . Essential hypertension   . Gastritis 07/29/2013   confirmed on EGD, bx done an negative for intestinal metaplasia, dsyplasia or H. pylori. normal gastric emptying study done 07/13/2013.  [name] GERD (gastroesophageal reflux disease)   . Morbid obesity (HCC)   . Non-obstructive CAD    a. 02/2013 Cath ([place]): nonobs dzs;  b. 09/2014 Myoview ([place] EF 55%, no ischemia;  c. 12/2014 Echo: EF 50-55%, gr1 DD, no effusion.  [name] PAF (paroxysmal atrial fibrillation) (HCC)    a. 02/2013 s/p rfca in [place], [place]-->prev on Xarelto, d/c'd 2/2 anemia, ? GIB.  [name] Rheumatoid arthritis (HCC)   . Sigmoid diverticulosis 08/01/2013   confirmed on colonscopy. record scanned into chart   Family History  Problem Relation Age of Onset  . Hypertension Mother   . Diabetes Mother   . Cancer  Mother   . COPD Mother   . Hypertension Father   . Diabetes Father   . Heart Problems Father   . COPD Father    Past Surgical History:  Procedure Laterality Date  . CARDIAC CATHETERIZATION  05/2014   ablation for atrial fibrillation  . CARDIAC CATHETERIZATION N/A 11/16/2015   Procedure: Left Heart Cath and Coronary Angiography;  Surgeon: Marykay Lex, MD;  Location: [facility] INVASIVE CV LAB;  Service: Cardiovascular;  Laterality: N/A;  . COLON RESECTION  09/2013   due to large, abnormal polpy. non cancerous per patient.   . COLON SURGERY  09/2014   colon resection    Social History   Social History  . Marital status:  Married    Spouse name: Amethyst  . Number of children: 8  . Years of education: 9   Occupational History  .      disabled   Social History Main Topics  . Smoking status: Former Smoker    Packs/day: 0.50    Years: 10.00    Types: Cigarettes    Quit date: 04/11/2008  . Smokeless tobacco: Never Used  . Alcohol use No  . Drug use: No  . Sexual activity: Not on file   Other Topics Concern  . Not on file   Social History Narrative   Lives with wife. Does not work.  On disability.    Caffeine- coffee, 1/2 cup daily     Review of Systems: General: negative for chills, fever, night sweats or weight changes.  Cardiovascular: negative for chest pain, dyspnea on exertion, edema, orthopnea, palpitations, paroxysmal nocturnal dyspnea or shortness of breath Dermatological: negative for rash Respiratory: negative for cough or wheezing Urologic: negative for hematuria Abdominal: negative for nausea, vomiting, diarrhea, bright red blood per rectum, melena, or hematemesis Neurologic: negative for visual changes, syncope, or dizziness All other systems reviewed and are otherwise negative except as noted above.   Physical Exam:  Blood pressure 128/62, pulse 84, height '5\' 6"'$  (1.676 m), weight 227 lb (103 kg), SpO2 97 %.  General appearance: alert, cooperative and no  distress Neck: no carotid bruit and no JVD Lungs: clear to auscultation bilaterally Heart: regular rate and rhythm, S1, S2 normal, no murmur, click, rub or gallop Extremities: extremities normal, atraumatic, no cyanosis or edema Pulses: 2+ and symmetric Skin: Skin color, texture, turgor normal. No rashes or lesions Neurologic: Grossly normal  EKG not performed.   ASSESSMENT AND PLAN:   1. Chronic Systolic HF: EF 54-56% on recent echo. Normal LHC 11/2015. Recent acute exacerbation occurred after he stopped his lasix. This has been restarted and he reports full compliance. No resting dyspnea but he continues to have mild exertional dyspnea. He appears euvolemic on physical exam. We will check a f/u BNP today. If elevated, we will have him increase his lasix to BID dosing. We will also check a repeat BMP today to assess renal function and electrolytes. Continuation of low sodium diet advised. We discussed importance of daily weights. BP is stable. Continue Bidil and Coreg.   PLAN  F/u with [name] in 6 weeks.   Dom Haverland [name] 09/25/2016 10:50 AM

## 2016-09-25 NOTE — Telephone Encounter (Signed)
Notes Recorded by Deneise Lever, MD on 09/18/2016 at 1:42 PM EST CXR- fluid overload/ vascular congestion has improved some since last xray.  Called and spoke with pt and he is aware of cxr results per CY.

## 2016-09-29 ENCOUNTER — Telehealth: Payer: Self-pay | Admitting: Internal Medicine

## 2016-09-29 ENCOUNTER — Other Ambulatory Visit: Payer: Self-pay | Admitting: Family Medicine

## 2016-09-29 MED ORDER — ALBUTEROL SULFATE HFA 108 (90 BASE) MCG/ACT IN AERS: 2.0000 | INHALATION_SPRAY | Freq: Four times a day (QID) | RESPIRATORY_TRACT | 2 refills | Status: DC | PRN

## 2016-09-29 NOTE — Telephone Encounter (Signed)
Called patient Left message His insurance is not covering combivent He has anoro ellipta which is long acting anticholinergic  Plan: Continue albuterol Plan to prescribe albuterol nebulizer if inhaler seems ineffective

## 2016-09-29 NOTE — Telephone Encounter (Signed)
Notes Recorded by Deneise Lever, MD on 09/18/2016 at 1:42 PM EST CXR- fluid overload/ vascular congestion has improved some since last xray.  Pt aware of results & voiced his understanding. Nothing further needed.

## 2016-09-29 NOTE — Progress Notes (Signed)
Left message for patient to contact office for medical results.

## 2016-09-30 ENCOUNTER — Ambulatory Visit: Payer: Medicaid Other | Admitting: Sports Medicine

## 2016-10-09 ENCOUNTER — Other Ambulatory Visit: Payer: Self-pay | Admitting: Family Medicine

## 2016-10-10 ENCOUNTER — Other Ambulatory Visit: Payer: Self-pay | Admitting: Pharmacist

## 2016-10-10 MED ORDER — CARVEDILOL 25 MG PO TABS
25.0000 mg | ORAL_TABLET | Freq: Two times a day (BID) | ORAL | 0 refills | Status: DC
Start: 2016-10-10 — End: 2016-12-30

## 2016-10-10 MED ORDER — INSULIN ASPART 100 UNIT/ML ~~LOC~~ SOLN: 14.0000 [IU] | Freq: Three times a day (TID) | SUBCUTANEOUS | 3 refills | Status: DC

## 2016-10-10 MED ORDER — ISOSORB DINITRATE-HYDRALAZINE 20-37.5 MG PO TABS: 1.0000 | ORAL_TABLET | Freq: Three times a day (TID) | ORAL | 0 refills | Status: DC

## 2016-10-13 ENCOUNTER — Other Ambulatory Visit: Payer: Self-pay | Admitting: Pharmacist

## 2016-10-13 MED ORDER — INSULIN ASPART 100 UNIT/ML FLEXPEN: 14.0000 [IU] | PEN_INJECTOR | Freq: Three times a day (TID) | SUBCUTANEOUS | 3 refills | Status: DC

## 2016-10-20 ENCOUNTER — Ambulatory Visit: Payer: Medicaid Other | Admitting: Family Medicine

## 2016-10-21 ENCOUNTER — Ambulatory Visit: Payer: Medicaid Other | Attending: Family Medicine | Admitting: Family Medicine

## 2016-10-21 ENCOUNTER — Other Ambulatory Visit (HOSPITAL_COMMUNITY)
Admission: RE | Admit: 2016-10-21 | Discharge: 2016-10-21 | Disposition: A | Discharge status: Home / Self Care | Payer: Medicaid Other | Source: Ambulatory Visit | Attending: Family Medicine | Admitting: Family Medicine

## 2016-10-21 ENCOUNTER — Other Ambulatory Visit: Payer: Self-pay | Admitting: Family Medicine

## 2016-10-21 ENCOUNTER — Encounter: Payer: Self-pay | Admitting: Family Medicine

## 2016-10-21 VITALS — BP 117/76 | HR 68 | Temp 98.3°F | Ht 66.0 in | Wt 226.8 lb

## 2016-10-21 DIAGNOSIS — I110 Hypertensive heart disease with heart failure: Secondary | ICD-10-CM | POA: Diagnosis present

## 2016-10-21 DIAGNOSIS — I2510 Atherosclerotic heart disease of native coronary artery without angina pectoris: Secondary | ICD-10-CM | POA: Diagnosis not present

## 2016-10-21 DIAGNOSIS — I10 Essential (primary) hypertension: Secondary | ICD-10-CM | POA: Diagnosis not present

## 2016-10-21 DIAGNOSIS — M109 Gout, unspecified: Secondary | ICD-10-CM | POA: Insufficient documentation

## 2016-10-21 DIAGNOSIS — Z79899 Other long term (current) drug therapy: Secondary | ICD-10-CM | POA: Diagnosis not present

## 2016-10-21 DIAGNOSIS — G4733 Obstructive sleep apnea (adult) (pediatric): Secondary | ICD-10-CM | POA: Diagnosis not present

## 2016-10-21 DIAGNOSIS — Z7952 Long term (current) use of systemic steroids: Secondary | ICD-10-CM | POA: Diagnosis not present

## 2016-10-21 DIAGNOSIS — F5101 Primary insomnia: Secondary | ICD-10-CM | POA: Diagnosis not present

## 2016-10-21 DIAGNOSIS — N529 Male erectile dysfunction, unspecified: Secondary | ICD-10-CM | POA: Insufficient documentation

## 2016-10-21 DIAGNOSIS — N50811 Right testicular pain: Secondary | ICD-10-CM | POA: Insufficient documentation

## 2016-10-21 DIAGNOSIS — R079 Chest pain, unspecified: Secondary | ICD-10-CM | POA: Diagnosis not present

## 2016-10-21 DIAGNOSIS — Z113 Encounter for screening for infections with a predominantly sexual mode of transmission: Secondary | ICD-10-CM | POA: Insufficient documentation

## 2016-10-21 DIAGNOSIS — G8929 Other chronic pain: Secondary | ICD-10-CM | POA: Diagnosis not present

## 2016-10-21 DIAGNOSIS — G4700 Insomnia, unspecified: Secondary | ICD-10-CM | POA: Insufficient documentation

## 2016-10-21 DIAGNOSIS — Z87891 Personal history of nicotine dependence: Secondary | ICD-10-CM | POA: Diagnosis not present

## 2016-10-21 DIAGNOSIS — Z794 Long term (current) use of insulin: Secondary | ICD-10-CM

## 2016-10-21 DIAGNOSIS — E119 Type 2 diabetes mellitus without complications: Secondary | ICD-10-CM | POA: Diagnosis not present

## 2016-10-21 DIAGNOSIS — E1142 Type 2 diabetes mellitus with diabetic polyneuropathy: Secondary | ICD-10-CM | POA: Diagnosis not present

## 2016-10-21 DIAGNOSIS — Z7982 Long term (current) use of aspirin: Secondary | ICD-10-CM | POA: Insufficient documentation

## 2016-10-21 DIAGNOSIS — I5043 Acute on chronic combined systolic (congestive) and diastolic (congestive) heart failure: Secondary | ICD-10-CM | POA: Diagnosis present

## 2016-10-21 DIAGNOSIS — R300 Dysuria: Secondary | ICD-10-CM | POA: Diagnosis not present

## 2016-10-21 LAB — POCT URINALYSIS DIPSTICK
BILIRUBIN UA: NEGATIVE
Blood, UA: NEGATIVE
GLUCOSE UA: NEGATIVE
KETONES UA: NEGATIVE
Nitrite, UA: NEGATIVE
Protein, UA: NEGATIVE
Urobilinogen, UA: 0.2
pH, UA: 5

## 2016-10-21 LAB — GLUCOSE, POCT (MANUAL RESULT ENTRY): POC Glucose: 116 mg/dl — AB (ref 70–99)

## 2016-10-21 MED ORDER — ACCU-CHEK SOFTCLIX LANCETS MISC: 1.0000 | Freq: Three times a day (TID) | 12 refills | Status: DC

## 2016-10-21 MED ORDER — ESZOPICLONE 3 MG PO TABS: 3.0000 mg | ORAL_TABLET | Freq: Every day | ORAL | 2 refills | Status: DC

## 2016-10-21 MED ORDER — VITAMIN B-12 1000 MCG PO TABS: 1000.0000 ug | ORAL_TABLET | Freq: Every day | ORAL | 3 refills | Status: DC

## 2016-10-21 NOTE — Assessment & Plan Note (Signed)
Dysuria and testicular pain UA with trace nitrite only Sent urine culture, urine cytology Advised patient f/u with urology

## 2016-10-21 NOTE — Patient Instructions (Addendum)
Marco [name] was seen today for hypertension.  Diagnoses and all orders for this visit:  Acute on chronic combined systolic and diastolic heart failure (HCC)  Type 2 diabetes mellitus without complication, with long-term current use of insulin (HCC) -     POCT glucose (manual entry)  Essential hypertension  Primary insomnia -     Eszopiclone 3 MG TABS; Take 1 tablet (3 mg total) by mouth at bedtime. Take immediately before bedtime  Diabetic polyneuropathy associated with type 2 diabetes mellitus (HCC) -     vitamin B-12 (CYANOCOBALAMIN) 1000 MCG tablet; Take 1 tablet (1,000 mcg total) by mouth daily.  Dysuria -     Urinalysis Dipstick -     Urine culture -     Urine cytology ancillary only   Please call for follow up with Dr. Irine Seal with [facility] for scrotal pain, dysuria and erectile dysfunction   [facility] ?   Medical clinic in [place], [place]  Address: [address] [place], [place], [place] [address]  Phone: [phone]  F/u in 6 weeks for insomnia   Dr. Adrian Blackwater

## 2016-10-21 NOTE — Assessment & Plan Note (Signed)
Well-controlled.  Continue current regimen. 

## 2016-10-21 NOTE — Addendum Note (Signed)
Addended by: Boykin Nearing on: 10/21/2016 06:18 PM   Modules accepted: Orders

## 2016-10-21 NOTE — Progress Notes (Signed)
Subjective:  Patient ID: Marco [name], male    DOB: 1956-07-26  Age: 61 y.o. MRN: [id number]  CC: Hypertension   HPI Marco [name] has diabetes, hx of chronic chest pain with minimal CAD (based on cath 11/2015), gout, psoriasis, arthralgia (on chronic prednisone 10 mg daily), chronic pain, OSA  presents for   1. HTN and CHF: compliant with regimen. No leg swelling. Having some shortness of breath with exertion. Some chest tightening. Tightening in chest is exacerbated by talking. Symptoms occur throughout the day at rest.   2. Insomnia: he is taking lunesta without improvement in sleep latency. He reports staying awake for over a hour after taking lunesta. He is compliant with CPAP. He is tired most days.   3. Dysuria: x 2 weeks. Comes and goes but is often. He also reports testicular pain which is bilateral and erectile dysfunction.   Social History  Substance Use Topics  . Smoking status: Former Smoker    Packs/day: 0.50    Years: 10.00    Types: Cigarettes    Quit date: 04/11/2008  . Smokeless tobacco: Never Used  . Alcohol use No    Outpatient Medications Prior to Visit  Medication Sig Dispense Refill  . ACCU-CHEK AVIVA PLUS test strip USE ONE STRIP TO CHECK BLOOD GLUCOSE 3 TIMES DAILY 100 each 12  . ACCU-CHEK FASTCLIX LANCETS MISC 1 each by Other route 3 (three) times daily. 102 each 11  . albuterol (PROVENTIL HFA;VENTOLIN HFA) 108 (90 Base) MCG/ACT inhaler Inhale 2 puffs into the lungs every 6 (six) hours as needed for wheezing or shortness of breath. 1 Inhaler 2  . ANDROGEL PUMP 20.25 MG/ACT (1.62%) GEL Apply 4 application topically every morning.  5  . atorvastatin (LIPITOR) 40 MG tablet TAKE ONE TABLET BY MOUTH ONCE DAILY 90 tablet 0  . BD PEN NEEDLE NANO U/F 32G X 4 MM MISC USE ONE PEN NEEDLE THREE TIMES DAILY 100 each 11  . Blood Glucose Monitoring Suppl (ACCU-CHEK AVIVA PLUS) W/DEVICE KIT 1 Device by Does not apply route 3 (three) times daily after meals. E11.9 1 kit 0    . Blood Pressure KIT 1 each by Does not apply route 2 (two) times daily. 1 each 0  . carvedilol (COREG) 25 MG tablet Take 1 tablet (25 mg total) by mouth 2 (two) times daily. 180 tablet 0  . EQ ASPIRIN ADULT LOW DOSE 81 MG EC tablet TAKE ONE TABLET BY MOUTH ONCE DAILY 90 tablet 0  . eszopiclone (LUNESTA) 1 MG TABS tablet Take 1 tablet (1 mg total) by mouth at bedtime. Take immediately before bedtime 30 tablet 1  . furosemide (LASIX) 40 MG tablet Take 1 tablet (40 mg total) by mouth daily. Take an extra 40 mg dose if weight up 3 lbs from previous day 60 tablet 3  . glucose blood (ACCU-CHEK AVIVA PLUS) test strip 1 each by Other route 3 (three) times daily. E11.9 100 each 12  . HYDROcodone-acetaminophen (NORCO/VICODIN) 5-325 MG tablet Take 1 tablet by mouth every 8 (eight) hours as needed for moderate pain. Per pain management    . insulin aspart (NOVOLOG FLEXPEN) 100 UNIT/ML FlexPen Inject 14-24 Units into the skin 3 (three) times daily with meals. 15 mL 3  . isosorbide-hydrALAZINE (BIDIL) 20-37.5 MG tablet Take 1 tablet by mouth 3 (three) times daily. 90 tablet 0  . LEVEMIR FLEXTOUCH 100 UNIT/ML Pen INJECT 60 UNITS SUBCUTANEOUSLY ONCE DAILY AT 10PM 15 mL 3  . lidocaine (XYLOCAINE) 5 % ointment  Apply 1 application topically 4 (four) times daily as needed. Apply to feet 35.44 g 2  . losartan (COZAAR) 100 MG tablet Take 0.5 tablets (50 mg total) by mouth daily. 30 tablet 5  . metFORMIN (GLUCOPHAGE-XR) 500 MG 24 hr tablet Take 2 tablets (1,000 mg total) by mouth 2 (two) times daily. 120 tablet 5  . predniSONE (DELTASONE) 10 MG tablet Take 10 mg by mouth daily with breakfast.    . pregabalin (LYRICA) 100 MG capsule Take 1 capsule (100 mg total) by mouth 3 (three) times daily. 90 capsule 5  . ranitidine (ZANTAC) 150 MG tablet TAKE ONE TABLET BY MOUTH AT BEDTIME 90 tablet 0  . Secukinumab (COSENTYX) 150 MG/ML SOSY Inject 150 mg into the skin every 28 (twenty-eight) days.     [name] umeclidinium-vilanterol  (ANORO ELLIPTA) 62.5-25 MCG/INH AEPB Inhale 1 puff into the lungs daily. 1 each 5  . vitamin B-12 (CYANOCOBALAMIN) 1000 MCG tablet Take 1 tablet (1,000 mcg total) by mouth daily. 30 tablet 3   No facility-administered medications prior to visit.     ROS Review of Systems  Constitutional: Positive for appetite change (early satiety ). Negative for chills, fatigue, fever and unexpected weight change.  HENT: Positive for facial swelling and hearing loss.        Dry mouth   Eyes: Negative for visual disturbance.  Respiratory: Positive for shortness of breath. Negative for cough.   Cardiovascular: Negative for chest pain, palpitations and leg swelling.  Gastrointestinal: Negative for abdominal distention, abdominal pain, anal bleeding, blood in stool, constipation, diarrhea, nausea, rectal pain and vomiting.  Endocrine: Negative for polydipsia, polyphagia and polyuria.  Genitourinary: Positive for dysuria and testicular pain. Negative for scrotal swelling.  Musculoskeletal: Negative for arthralgias, back pain, gait problem, joint swelling, myalgias and neck pain.  Skin: Negative for rash.  Allergic/Immunologic: Negative for immunocompromised state.  Neurological: Positive for numbness (hands and feet ). Negative for dizziness, weakness and headaches.  Hematological: Negative for adenopathy. Does not bruise/bleed easily.  Psychiatric/Behavioral: Positive for dysphoric mood. Negative for sleep disturbance and suicidal ideas. The patient is not nervous/anxious.     Objective:  BP 117/76   Pulse 68   Temp 98.3 F (36.8 C) (Oral)   Ht '5\' 6"'$  (1.676 m)   Wt 226 lb 12.8 oz (102.9 kg)   SpO2 94%   BMI 36.61 kg/m   BP/Weight 10/21/2016 09/25/2016 5/36/6440  Systolic BP 347 425 956  Diastolic BP 76 62 70  Wt. (Lbs) 226.8 227 231.4  BMI 36.61 36.64 37.35   Wt Readings from Last 3 Encounters:  10/21/16 226 lb 12.8 oz (102.9 kg)  09/25/16 227 lb (103 kg)  09/23/16 231 lb 6.4 oz (105 kg)     Physical Exam  Constitutional: He appears well-developed and well-nourished. No distress.  HENT:  Head: Normocephalic and atraumatic.  Mostly edentulous   Neck: Normal range of motion. Neck supple.  Cardiovascular: Normal rate, regular rhythm, normal heart sounds and intact distal pulses.   Pulmonary/Chest: Effort normal. No respiratory distress. He has no wheezes. He has no rales. He exhibits no tenderness.  Abdominal: Hernia confirmed negative in the right inguinal area and confirmed negative in the left inguinal area.  Genitourinary:    Right testis shows tenderness. Right testis shows no mass and no swelling. Right testis is descended. Left testis shows tenderness. Left testis shows no mass and no swelling. Left testis is descended.  Musculoskeletal: He exhibits no edema.  Lymphadenopathy:  Right: No inguinal adenopathy present.       Left: No inguinal adenopathy present.  Neurological: He is alert.  Skin: Skin is warm and dry. No rash noted. No erythema.   Lab Results  Component Value Date   HGBA1C 7.5 (H) 09/02/2016   CBG 116  UA: trace leukocyte esterase, otherwise negative   Depression screen [facility] 2/9 09/23/2016 09/09/2016 09/02/2016  Decreased Interest 0 0 0  Down, Depressed, Hopeless '1 1 1  '$ PHQ - 2 Score '1 1 1  '$ Altered sleeping '3 3 3  '$ Tired, decreased energy '3 2 2  '$ Change in appetite 0 0 1  Feeling bad or failure about yourself  0 0 0  Trouble concentrating 0 0 1  Moving slowly or fidgety/restless 0 0 0  Suicidal thoughts 0 0 0  PHQ-9 Score '7 6 8  '$ Some recent data might be hidden   GAD 7 : Generalized Anxiety Score 09/23/2016 09/09/2016 09/02/2016 08/19/2016  Nervous, Anxious, on Edge 0 0 0 2  Control/stop worrying 2 0 0 3  Worry too much - different things '3 1 1 3  '$ Trouble relaxing '1 1 1 2  '$ Restless 0 0 0 0  Easily annoyed or irritable 0 0 0 0  Afraid - awful might happen 0 0 0 2  Total GAD 7 Score '6 2 2 12    '$ Assessment & Plan:  Dayson was seen  today for hypertension.  Diagnoses and all orders for this visit:  Acute on chronic combined systolic and diastolic heart failure (HCC)  Type 2 diabetes mellitus without complication, with long-term current use of insulin (HCC) -     POCT glucose (manual entry)  Essential hypertension  Primary insomnia -     Eszopiclone 3 MG TABS; Take 1 tablet (3 mg total) by mouth at bedtime. Take immediately before bedtime  Diabetic polyneuropathy associated with type 2 diabetes mellitus (HCC) -     vitamin B-12 (CYANOCOBALAMIN) 1000 MCG tablet; Take 1 tablet (1,000 mcg total) by mouth daily.  Dysuria -     Urinalysis Dipstick   There are no diagnoses linked to this encounter.  No orders of the defined types were placed in this encounter.   Follow-up: Return in about 6 weeks (around 12/02/2016) for insomnia .   Boykin Nearing MD

## 2016-10-21 NOTE — Assessment & Plan Note (Signed)
No improvement Increase lunesta to 3 mg nightly

## 2016-10-23 LAB — URINE CYTOLOGY ANCILLARY ONLY
CHLAMYDIA, DNA PROBE: NEGATIVE
Neisseria Gonorrhea: NEGATIVE
Trichomonas: NEGATIVE

## 2016-10-26 LAB — URINE CULTURE

## 2016-10-28 ENCOUNTER — Telehealth: Payer: Self-pay | Admitting: Family Medicine

## 2016-10-28 NOTE — Telephone Encounter (Signed)
Patient called the office to follow up on his lab result. Please contact patient.   Thank you

## 2016-10-30 NOTE — Telephone Encounter (Signed)
Pt was called and informed of lab results. 

## 2016-11-09 ENCOUNTER — Other Ambulatory Visit: Payer: Self-pay | Admitting: Family Medicine

## 2016-11-09 DIAGNOSIS — M2550 Pain in unspecified joint: Secondary | ICD-10-CM

## 2016-11-09 DIAGNOSIS — K279 Peptic ulcer, site unspecified, unspecified as acute or chronic, without hemorrhage or perforation: Secondary | ICD-10-CM

## 2016-11-10 ENCOUNTER — Telehealth: Payer: Self-pay | Admitting: Family Medicine

## 2016-11-10 NOTE — Telephone Encounter (Signed)
Pt. Called requesting a refill on predniSONE (DELTASONE) 10 MG tablet  Pt. Would like to speak with his PCP b/c the pharmacy states that he cannot receive a Refill on the medication b/c he needs an OV. Please f/u with pt.

## 2016-11-10 NOTE — Telephone Encounter (Signed)
Will roue to PCP

## 2016-11-11 MED ORDER — PREDNISONE 10 MG PO TABS: 10.0000 mg | ORAL_TABLET | Freq: Every day | ORAL | 2 refills | Status: DC

## 2016-11-11 NOTE — Telephone Encounter (Signed)
Prednisone refilled No office visit needed

## 2016-11-21 ENCOUNTER — Encounter: Payer: Self-pay | Admitting: Physician Assistant

## 2016-11-21 ENCOUNTER — Ambulatory Visit (HOSPITAL_COMMUNITY)
Admission: RE | Admit: 2016-11-21 | Discharge: 2016-11-21 | Disposition: A | Discharge status: Home / Self Care | Payer: Medicaid Other | Source: Ambulatory Visit | Attending: Physician Assistant | Admitting: Physician Assistant

## 2016-11-21 ENCOUNTER — Ambulatory Visit: Payer: Medicaid Other | Attending: Family Medicine | Admitting: Physician Assistant

## 2016-11-21 VITALS — BP 156/77 | HR 76 | Temp 98.5°F | Resp 16 | Wt 238.0 lb

## 2016-11-21 DIAGNOSIS — M7989 Other specified soft tissue disorders: Secondary | ICD-10-CM | POA: Insufficient documentation

## 2016-11-21 DIAGNOSIS — Z7984 Long term (current) use of oral hypoglycemic drugs: Secondary | ICD-10-CM | POA: Insufficient documentation

## 2016-11-21 DIAGNOSIS — K449 Diaphragmatic hernia without obstruction or gangrene: Secondary | ICD-10-CM | POA: Diagnosis not present

## 2016-11-21 DIAGNOSIS — M5136 Other intervertebral disc degeneration, lumbar region: Secondary | ICD-10-CM | POA: Diagnosis not present

## 2016-11-21 DIAGNOSIS — E118 Type 2 diabetes mellitus with unspecified complications: Secondary | ICD-10-CM | POA: Diagnosis not present

## 2016-11-21 DIAGNOSIS — I480 Paroxysmal atrial fibrillation: Secondary | ICD-10-CM | POA: Insufficient documentation

## 2016-11-21 DIAGNOSIS — Z79899 Other long term (current) drug therapy: Secondary | ICD-10-CM | POA: Insufficient documentation

## 2016-11-21 DIAGNOSIS — Z7982 Long term (current) use of aspirin: Secondary | ICD-10-CM | POA: Diagnosis not present

## 2016-11-21 DIAGNOSIS — I10 Essential (primary) hypertension: Secondary | ICD-10-CM | POA: Diagnosis not present

## 2016-11-21 DIAGNOSIS — I2510 Atherosclerotic heart disease of native coronary artery without angina pectoris: Secondary | ICD-10-CM | POA: Insufficient documentation

## 2016-11-21 DIAGNOSIS — M069 Rheumatoid arthritis, unspecified: Secondary | ICD-10-CM | POA: Insufficient documentation

## 2016-11-21 DIAGNOSIS — K219 Gastro-esophageal reflux disease without esophagitis: Secondary | ICD-10-CM | POA: Insufficient documentation

## 2016-11-21 DIAGNOSIS — G8929 Other chronic pain: Secondary | ICD-10-CM | POA: Insufficient documentation

## 2016-11-21 DIAGNOSIS — R609 Edema, unspecified: Secondary | ICD-10-CM

## 2016-11-21 DIAGNOSIS — M545 Low back pain, unspecified: Secondary | ICD-10-CM

## 2016-11-21 DIAGNOSIS — M4046 Postural lordosis, lumbar region: Secondary | ICD-10-CM | POA: Diagnosis not present

## 2016-11-21 DIAGNOSIS — I700 Atherosclerosis of aorta: Secondary | ICD-10-CM | POA: Diagnosis not present

## 2016-11-21 LAB — GLUCOSE, POCT (MANUAL RESULT ENTRY): POC GLUCOSE: 132 mg/dL — AB (ref 70–99)

## 2016-11-21 MED ORDER — MELOXICAM 15 MG PO TABS: 15.0000 mg | ORAL_TABLET | Freq: Every day | ORAL | 0 refills | Status: DC

## 2016-11-21 MED ORDER — METHOCARBAMOL 500 MG PO TABS: 500.0000 mg | ORAL_TABLET | Freq: Four times a day (QID) | ORAL | 0 refills | Status: DC

## 2016-11-22 LAB — BASIC METABOLIC PANEL
BUN/Creatinine Ratio: 12 (ref 10–24)
BUN: 22 mg/dL (ref 8–27)
CO2: 30 mmol/L — AB (ref 18–29)
Calcium: 8.8 mg/dL (ref 8.6–10.2)
Chloride: 101 mmol/L (ref 96–106)
Creatinine, Ser: 1.91 mg/dL — ABNORMAL HIGH (ref 0.76–1.27)
GFR calc Af Amer: 43 mL/min/{1.73_m2} — ABNORMAL LOW (ref >59)
GFR calc non Af Amer: 37 mL/min/{1.73_m2} — ABNORMAL LOW (ref >59)
GLUCOSE: 129 mg/dL — AB (ref 65–99)
POTASSIUM: 3.8 mmol/L (ref 3.5–5.2)
SODIUM: 143 mmol/L (ref 134–144)

## 2016-11-23 NOTE — Progress Notes (Signed)
Marco [name], is a 61 y.o. male  KPV:[id number]  MLJ:[id number]  DOB - [birth year]  Subjective:  Chief Complaint and HPI: Marco [name] is a 61 y.o. male here today for low back pain that has been worse over the last 2-3 weeks.  NKI or precipitating event.  He is seen by pain management for ongoing pain issues.  He denies radiating pain into the legs or any weakness. No problems moving bowels or bladder.  He did sleep in a different bed over the last couple of weeks at a hotel.  He also has problems with swelling in his legs from time to time that he takes lasix for.  He denies any increase in SOB.   ROS:   Constitutional:  No f/c, No night sweats, No unexplained weight loss. EENT:  No vision changes, No blurry vision, No hearing changes. No mouth, throat, or ear problems.  Respiratory: No cough, No SOB Cardiac: No CP, no palpitations GI:  No abd pain, No N/V/D. GU: No Urinary s/sx Musculoskeletal: +LBP Neuro: No headache, no dizziness, no motor weakness.  Skin: No rash Endocrine:  No polydipsia. No polyuria.  Psych: Denies SI/HI  No problems updated.  ALLERGIES: No Known Allergies  PAST MEDICAL HISTORY: Past Medical History:  Diagnosis Date  . Benign colon polyp 08/01/2013   [facility] in [place]. large base tranverse colon polyp was biopsied. polyp was benign with minimal surface hyperplastic change.  . Chronic pain   . Diabetes mellitus without complication ([place])   . Diverticulosis   . Esophageal hiatal hernia 07/29/2013   confirmed on EGD   . Esophageal stricture   . Essential hypertension   . Gastritis 07/29/2013   confirmed on EGD, bx done an negative for intestinal metaplasia, dsyplasia or H. pylori. normal gastric emptying study done 07/13/2013.  [name] GERD (gastroesophageal reflux disease)   . Morbid obesity ([place])   . Non-obstructive CAD    a. 02/2013 Cath ([place]): nonobs dzs;  b. 09/2014 Myoview ([place]): EF 55%, no ischemia;  c. 12/2014 Echo: EF 50-55%, gr1  DD, no effusion.  [name] PAF (paroxysmal atrial fibrillation) ([place])    a. 02/2013 s/p rfca in [place], [place]-->prev on Xarelto, d/c'd 2/2 anemia, ? GIB.  [name] Rheumatoid arthritis ([place])   . Sigmoid diverticulosis 08/01/2013   confirmed on colonscopy. record scanned into chart    MEDICATIONS AT HOME: Prior to Admission medications   Medication Sig Start Date End Date Taking? Authorizing Provider  ACCU-CHEK AVIVA PLUS test strip USE ONE STRIP TO CHECK BLOOD GLUCOSE 3 TIMES DAILY 07/30/16   Boykin Nearing, MD  ACCU-CHEK SOFTCLIX LANCETS lancets USE AS DIRECTED 10/22/16   Boykin Nearing, MD  ACCU-CHEK SOFTCLIX LANCETS lancets 1 each by Other route 3 (three) times daily. Use as instructed 10/21/16   Boykin Nearing, MD  albuterol (PROVENTIL HFA;VENTOLIN HFA) 108 (90 Base) MCG/ACT inhaler Inhale 2 puffs into the lungs every 6 (six) hours as needed for wheezing or shortness of breath. 09/29/16   Josalyn Funches, MD  ANDROGEL PUMP 20.25 MG/ACT (1.62%) GEL Apply 4 application topically every morning. 06/02/16   Historical Provider, MD  atorvastatin (LIPITOR) 40 MG tablet TAKE ONE TABLET BY MOUTH ONCE DAILY 11/10/16   Arnoldo Morale, MD  BD PEN NEEDLE NANO U/F 32G X 4 MM MISC USE ONE PEN NEEDLE THREE TIMES DAILY 08/25/16   Tresa Garter, MD  BIDIL 20-37.5 MG tablet TAKE 1 TABLET BY MOUTH THREE TIMES DAILY 11/10/16   Arnoldo Morale, MD  Blood Glucose Monitoring Suppl (ACCU-CHEK AVIVA PLUS) W/DEVICE KIT 1 Device by Does not apply route 3 (three) times daily after meals. E11.9 06/14/15   Josalyn Funches, MD  Blood Pressure KIT 1 each by Does not apply route 2 (two) times daily. 09/09/16   Josalyn Funches, MD  carvedilol (COREG) 25 MG tablet Take 1 tablet (25 mg total) by mouth 2 (two) times daily. 10/10/16   Josalyn Funches, MD  EQ ASPIRIN ADULT LOW DOSE 81 MG EC tablet TAKE ONE TABLET BY MOUTH ONCE DAILY 07/11/16   Boykin Nearing, MD  Eszopiclone 3 MG TABS Take 1 tablet (3 mg total) by mouth at bedtime. Take immediately  before bedtime 10/21/16   Boykin Nearing, MD  furosemide (LASIX) 40 MG tablet Take 1 tablet (40 mg total) by mouth daily. Take an extra 40 mg dose if weight up 3 lbs from previous day 09/23/16   Josalyn Funches, MD  glucose blood (ACCU-CHEK AVIVA PLUS) test strip 1 each by Other route 3 (three) times daily. E11.9 06/14/15   Boykin Nearing, MD  HYDROcodone-acetaminophen (NORCO/VICODIN) 5-325 MG tablet Take 1 tablet by mouth every 8 (eight) hours as needed for moderate pain. Per pain management    Historical Provider, MD  insulin aspart (NOVOLOG FLEXPEN) 100 UNIT/ML FlexPen Inject 14-24 Units into the skin 3 (three) times daily with meals. 10/13/16   Josalyn Funches, MD  LEVEMIR FLEXTOUCH 100 UNIT/ML Pen INJECT 60 UNITS SUBCUTANEOUSLY ONCE DAILY AT 10PM 10/10/16   Josalyn Funches, MD  lidocaine (XYLOCAINE) 5 % ointment Apply 1 application topically 4 (four) times daily as needed. Apply to feet 08/19/16   Josalyn Funches, MD  losartan (COZAAR) 100 MG tablet Take 0.5 tablets (50 mg total) by mouth daily. 09/06/16   Florencia Reasons, MD  meloxicam (MOBIC) 15 MG tablet Take 1 tablet (15 mg total) by mouth daily. 11/21/16   Argentina Donovan, [name]  metFORMIN (GLUCOPHAGE-XR) 500 MG 24 hr tablet Take 2 tablets (1,000 mg total) by mouth 2 (two) times daily. 06/10/16   Josalyn Funches, MD  methocarbamol (ROBAXIN) 500 MG tablet Take 1 tablet (500 mg total) by mouth 4 (four) times daily. X 7 days then prn muscle spasm 11/21/16   Argentina Donovan, [name]  predniSONE (DELTASONE) 10 MG tablet Take 1 tablet (10 mg total) by mouth daily with breakfast. 11/11/16   Boykin Nearing, MD  pregabalin (LYRICA) 100 MG capsule Take 1 capsule (100 mg total) by mouth 3 (three) times daily. 08/19/16   Josalyn Funches, MD  ranitidine (ZANTAC) 150 MG tablet TAKE ONE TABLET BY MOUTH AT BEDTIME 11/10/16   Arnoldo Morale, MD  Secukinumab (COSENTYX) 150 MG/ML SOSY Inject 150 mg into the skin every 28 (twenty-eight) days.  06/02/16   Historical Provider, MD    umeclidinium-vilanterol (ANORO ELLIPTA) 62.5-25 MCG/INH AEPB Inhale 1 puff into the lungs daily. 02/15/16   Deneise Lever, MD  vitamin B-12 (CYANOCOBALAMIN) 1000 MCG tablet Take 1 tablet (1,000 mcg total) by mouth daily. 10/21/16   Josalyn Funches, MD     Objective:  EXAM:   Vitals:   11/21/16 1612  BP: (!) 156/77  Pulse: 76  Resp: 16  Temp: 98.5 F (36.9 C)  TempSrc: Oral  SpO2: 96%  Weight: 238 lb (108 kg)    General appearance : A&OX3. NAD. Non-toxic-appearing HEENT: Atraumatic and Normocephalic.  PERRLA. EOM intact.   Neck: supple, no JVD. No cervical lymphadenopathy. No thyromegaly Chest/Lungs:  Breathing-non-labored, Good air entry bilaterally, breath sounds normal without rales, rhonchi,  or wheezing  CVS: S1 S2 regular, no murmurs, gallops, rubs  Back: ROM about 70% in all aspects.  +paraspinus muscle spasm in LB B.  LE DTR=B.  No babinski. Extremities: Bilateral Lower Ext shows mild <1+ edema, both legs are warm to touch with = pulse throughout Neurology:  CN II-XII grossly intact, Non focal.   Psych:  TP linear. J/I WNL. Normal speech. Appropriate eye contact and affect.  Skin:  No Rash  Data Review Lab Results  Component Value Date   HGBA1C 7.5 (H) 09/02/2016   HGBA1C 7.7 08/19/2016   HGBA1C 8.5 06/10/2016     Assessment & Plan   1. Diabetes mellitus with complication (HCC) suboptimal control.  Work on diet. - POCT glucose (manual entry) Continue current regimen  2. Acute low back pain without sciatica, unspecified back pain laterality Believe musculoskeletal - meloxicam (MOBIC) 15 MG tablet; Take 1 tablet (15 mg total) by mouth daily.  Dispense: 30 tablet; Refill: 0 - methocarbamol (ROBAXIN) 500 MG tablet; Take 1 tablet (500 mg total) by mouth 4 (four) times daily. X 7 days then prn muscle spasm  Dispense: 90 tablet; Refill: 0 - DG Lumbar Spine Complete; Future  3. Edema, unspecified type Mild today - Basic metabolic panel  Patient have been  counseled extensively about nutrition and exercise  Return for keep f/up with dr Adrian Blackwater as planned.  The patient was given clear instructions to go to ER or return to medical center if symptoms don't improve, worsen or new problems develop. The patient verbalized understanding. The patient was told to call to get lab results if they haven't heard anything in the next week.     Freeman Caldron, [name] [facility] [place], [place]   11/23/2016, 1:16 PMPatient ID: Marco [name], male   DOB: 12/31/1955, 61 y.o.   MRN: [id number]

## 2016-11-28 ENCOUNTER — Telehealth: Payer: Self-pay

## 2016-11-28 NOTE — Telephone Encounter (Signed)
Contacted pt to go over lab and xray results pt didn't answer lvm aksing pt to give me a call at his earliest convenience   If pt calls back please give results: Your kidney function appears to be declining. Good control of your diabetes and blood pressure is very important. Keep your follow up appt with Dr Adrian Blackwater. She may want to make some changes to your medications at your f/up visit.   Your xray showed some muscle spasms which the medications should help. It also showed some arthritis. Follow-up with Dr Adrian Blackwater as planned.

## 2016-12-04 ENCOUNTER — Encounter: Payer: Self-pay | Admitting: Family Medicine

## 2016-12-04 ENCOUNTER — Other Ambulatory Visit: Payer: Self-pay

## 2016-12-04 ENCOUNTER — Ambulatory Visit: Payer: Medicaid Other | Attending: Family Medicine | Admitting: Family Medicine

## 2016-12-04 VITALS — BP 94/53 | HR 85 | Temp 98.4°F | Ht 66.0 in | Wt 230.4 lb

## 2016-12-04 DIAGNOSIS — I509 Heart failure, unspecified: Secondary | ICD-10-CM | POA: Diagnosis not present

## 2016-12-04 DIAGNOSIS — Z87891 Personal history of nicotine dependence: Secondary | ICD-10-CM | POA: Insufficient documentation

## 2016-12-04 DIAGNOSIS — I130 Hypertensive heart and chronic kidney disease with heart failure and stage 1 through stage 4 chronic kidney disease, or unspecified chronic kidney disease: Secondary | ICD-10-CM | POA: Insufficient documentation

## 2016-12-04 DIAGNOSIS — N179 Acute kidney failure, unspecified: Secondary | ICD-10-CM | POA: Insufficient documentation

## 2016-12-04 DIAGNOSIS — G4733 Obstructive sleep apnea (adult) (pediatric): Secondary | ICD-10-CM | POA: Diagnosis not present

## 2016-12-04 DIAGNOSIS — E1122 Type 2 diabetes mellitus with diabetic chronic kidney disease: Secondary | ICD-10-CM | POA: Diagnosis not present

## 2016-12-04 DIAGNOSIS — Z7952 Long term (current) use of systemic steroids: Secondary | ICD-10-CM | POA: Diagnosis not present

## 2016-12-04 DIAGNOSIS — R300 Dysuria: Secondary | ICD-10-CM | POA: Insufficient documentation

## 2016-12-04 DIAGNOSIS — Z794 Long term (current) use of insulin: Secondary | ICD-10-CM | POA: Diagnosis not present

## 2016-12-04 DIAGNOSIS — N183 Chronic kidney disease, stage 3 unspecified: Secondary | ICD-10-CM

## 2016-12-04 DIAGNOSIS — M109 Gout, unspecified: Secondary | ICD-10-CM | POA: Insufficient documentation

## 2016-12-04 DIAGNOSIS — G8929 Other chronic pain: Secondary | ICD-10-CM | POA: Insufficient documentation

## 2016-12-04 DIAGNOSIS — I25119 Atherosclerotic heart disease of native coronary artery with unspecified angina pectoris: Secondary | ICD-10-CM | POA: Insufficient documentation

## 2016-12-04 DIAGNOSIS — Z7982 Long term (current) use of aspirin: Secondary | ICD-10-CM | POA: Insufficient documentation

## 2016-12-04 DIAGNOSIS — M549 Dorsalgia, unspecified: Secondary | ICD-10-CM | POA: Insufficient documentation

## 2016-12-04 DIAGNOSIS — E118 Type 2 diabetes mellitus with unspecified complications: Secondary | ICD-10-CM

## 2016-12-04 DIAGNOSIS — G4700 Insomnia, unspecified: Secondary | ICD-10-CM | POA: Diagnosis present

## 2016-12-04 DIAGNOSIS — R079 Chest pain, unspecified: Secondary | ICD-10-CM

## 2016-12-04 LAB — POCT URINALYSIS DIPSTICK
Bilirubin, UA: NEGATIVE
Blood, UA: NEGATIVE
Glucose, UA: NEGATIVE
Ketones, UA: NEGATIVE
Nitrite, UA: NEGATIVE
Protein, UA: NEGATIVE
Spec Grav, UA: <=1.005 — AB
Urobilinogen, UA: 0.2 U/dL
pH, UA: 5

## 2016-12-04 LAB — POCT GLYCOSYLATED HEMOGLOBIN (HGB A1C): Hemoglobin A1C: 6.9

## 2016-12-04 LAB — GLUCOSE, POCT (MANUAL RESULT ENTRY): POC Glucose: 169 mg/dL — AB (ref 70–99)

## 2016-12-04 MED ORDER — INSULIN DETEMIR 100 UNIT/ML FLEXPEN
50.0000 [IU] | PEN_INJECTOR | Freq: Every day | SUBCUTANEOUS | 3 refills | Status: DC
Start: 2016-12-04 — End: 2017-02-05

## 2016-12-04 MED ORDER — PREDNISONE 10 MG PO TABS: 10.0000 mg | ORAL_TABLET | Freq: Every day | ORAL | 1 refills | Status: DC

## 2016-12-04 NOTE — Patient Instructions (Addendum)
Marco [name] was seen today for insomnia.  Diagnoses and all orders for this visit:  Diabetes mellitus with complication ([place]) -     POCT glucose (manual entry) -     POCT glycosylated hemoglobin (Hb A1C) -     CBC -     Insulin Detemir (LEVEMIR FLEXTOUCH) 100 UNIT/ML Pen; Inject 50 Units into the skin daily at 10 pm.  Chest pain, unspecified type -     EKG 12-Lead -     Iron and TIBC -     Ferritin -     Brain natriuretic peptide -     BMP8+eGFR -     DG Chest 2 View; Future -     D-dimer, quantitative (not at [facility])  AKI (acute kidney injury) ([place]) -     POCT urinalysis dipstick  Dysuria -     Urine culture   Decrease levemir to 50 U at night.  Increase prednisone to 40 mg for 2 days, 30 mg for 2 days, 20 mg for 2 days then back to 10 mg daily   f/u in 4 weeks for chest pain, shortness of breath   Dr. Adrian Blackwater

## 2016-12-04 NOTE — Progress Notes (Signed)
Subjective:  Patient ID: Marco [name], male    DOB: [birth year]  Age: 61 y.o. MRN: [id number]  CC: Insomnia   HPI Trygg Mantz has diabetes, hx of chronic chest pain with minimal CAD (based on cath 11/2015), gout, psoriasis, arthralgia (on chronic prednisone 10 mg daily), chronic pain, OSA  presents for   1. HTN and CHF: compliant with regimen. No leg swelling. Having some shortness of breath with exertion. Some chest tightening. Tightening in chest is exacerbated by talking. Symptoms occur throughout the day at rest.   Her reports worsening chest pain and dyspnea on exertion. He reports congestion. He denies fever and cough.  Dyspnea with getting dressed. He also reports being cold all the time. He is concerned about iron deficiency and anemia. Symptoms have worsened over the past 2 weeks. He reports both legs and feet were swollen a few weeks ago. He took extra lasix for 2 days and the swelling improved. He is back down to lasix 40 mg daily. He also had increased in his back pain that improved with time. He did not take meloxicam or robaxin because the pain resolved. Lumbar x-ray revealed straightening of lordosis consistent with muscle spasm. He has no history of PE or VTE. No recent prolonged drives or flights. No recent bedrest.   Last ECHO 09/03/2016:  - Compared to a prior study in 2016, the LVEF is reduced to 45-50%   with global hypokinesis, grade 2 DD and elevated LV filling   pressure.   Social History  Substance Use Topics  . Smoking status: Former Smoker    Packs/day: 0.50    Years: 10.00    Types: Cigarettes    Quit date: 04/11/2008  . Smokeless tobacco: Never Used  . Alcohol use No    Outpatient Medications Prior to Visit  Medication Sig Dispense Refill  . ACCU-CHEK AVIVA PLUS test strip USE ONE STRIP TO CHECK BLOOD GLUCOSE 3 TIMES DAILY 100 each 12  . ACCU-CHEK SOFTCLIX LANCETS lancets USE AS DIRECTED 100 each 11  . ACCU-CHEK SOFTCLIX LANCETS lancets 1 each by Other  route 3 (three) times daily. Use as instructed 100 each 12  . albuterol (PROVENTIL HFA;VENTOLIN HFA) 108 (90 Base) MCG/ACT inhaler Inhale 2 puffs into the lungs every 6 (six) hours as needed for wheezing or shortness of breath. 1 Inhaler 2  . ANDROGEL PUMP 20.25 MG/ACT (1.62%) GEL Apply 4 application topically every morning.  5  . atorvastatin (LIPITOR) 40 MG tablet TAKE ONE TABLET BY MOUTH ONCE DAILY 90 tablet 0  . BD PEN NEEDLE NANO U/F 32G X 4 MM MISC USE ONE PEN NEEDLE THREE TIMES DAILY 100 each 11  . BIDIL 20-37.5 MG tablet TAKE 1 TABLET BY MOUTH THREE TIMES DAILY 270 tablet 0  . Blood Glucose Monitoring Suppl (ACCU-CHEK AVIVA PLUS) W/DEVICE KIT 1 Device by Does not apply route 3 (three) times daily after meals. E11.9 1 kit 0  . Blood Pressure KIT 1 each by Does not apply route 2 (two) times daily. 1 each 0  . carvedilol (COREG) 25 MG tablet Take 1 tablet (25 mg total) by mouth 2 (two) times daily. 180 tablet 0  . EQ ASPIRIN ADULT LOW DOSE 81 MG EC tablet TAKE ONE TABLET BY MOUTH ONCE DAILY 90 tablet 0  . Eszopiclone 3 MG TABS Take 1 tablet (3 mg total) by mouth at bedtime. Take immediately before bedtime 30 tablet 2  . furosemide (LASIX) 40 MG tablet Take 1 tablet (40 mg total)  by mouth daily. Take an extra 40 mg dose if weight up 3 lbs from previous day 60 tablet 3  . glucose blood (ACCU-CHEK AVIVA PLUS) test strip 1 each by Other route 3 (three) times daily. E11.9 100 each 12  . HYDROcodone-acetaminophen (NORCO/VICODIN) 5-325 MG tablet Take 1 tablet by mouth every 8 (eight) hours as needed for moderate pain. Per pain management    . insulin aspart (NOVOLOG FLEXPEN) 100 UNIT/ML FlexPen Inject 14-24 Units into the skin 3 (three) times daily with meals. 15 mL 3  . LEVEMIR FLEXTOUCH 100 UNIT/ML Pen INJECT 60 UNITS SUBCUTANEOUSLY ONCE DAILY AT 10PM 15 mL 3  . lidocaine (XYLOCAINE) 5 % ointment Apply 1 application topically 4 (four) times daily as needed. Apply to feet 35.44 g 2  . losartan  (COZAAR) 100 MG tablet Take 0.5 tablets (50 mg total) by mouth daily. 30 tablet 5  . meloxicam (MOBIC) 15 MG tablet Take 1 tablet (15 mg total) by mouth daily. 30 tablet 0  . metFORMIN (GLUCOPHAGE-XR) 500 MG 24 hr tablet Take 2 tablets (1,000 mg total) by mouth 2 (two) times daily. 120 tablet 5  . methocarbamol (ROBAXIN) 500 MG tablet Take 1 tablet (500 mg total) by mouth 4 (four) times daily. X 7 days then prn muscle spasm 90 tablet 0  . predniSONE (DELTASONE) 10 MG tablet Take 1 tablet (10 mg total) by mouth daily with breakfast. 30 tablet 2  . pregabalin (LYRICA) 100 MG capsule Take 1 capsule (100 mg total) by mouth 3 (three) times daily. 90 capsule 5  . ranitidine (ZANTAC) 150 MG tablet TAKE ONE TABLET BY MOUTH AT BEDTIME 90 tablet 0  . Secukinumab (COSENTYX) 150 MG/ML SOSY Inject 150 mg into the skin every 28 (twenty-eight) days.     [name] umeclidinium-vilanterol (ANORO ELLIPTA) 62.5-25 MCG/INH AEPB Inhale 1 puff into the lungs daily. 1 each 5  . vitamin B-12 (CYANOCOBALAMIN) 1000 MCG tablet Take 1 tablet (1,000 mcg total) by mouth daily. 90 tablet 3   No facility-administered medications prior to visit.     ROS Review of Systems  Constitutional: Positive for appetite change (early satiety ). Negative for chills, fatigue, fever and unexpected weight change.  HENT: Positive for facial swelling and hearing loss.        Dry mouth   Eyes: Negative for visual disturbance.  Respiratory: Positive for shortness of breath. Negative for cough.   Cardiovascular: Negative for chest pain, palpitations and leg swelling.  Gastrointestinal: Negative for abdominal distention, abdominal pain, anal bleeding, blood in stool, constipation, diarrhea, nausea, rectal pain and vomiting.  Endocrine: Negative for polydipsia, polyphagia and polyuria.  Genitourinary: Positive for dysuria and testicular pain. Negative for scrotal swelling.  Musculoskeletal: Negative for arthralgias, back pain, gait problem, joint  swelling, myalgias and neck pain.  Skin: Negative for rash.  Allergic/Immunologic: Negative for immunocompromised state.  Neurological: Positive for numbness (hands and feet ). Negative for dizziness, weakness and headaches.  Hematological: Negative for adenopathy. Does not bruise/bleed easily.  Psychiatric/Behavioral: Positive for dysphoric mood. Negative for sleep disturbance and suicidal ideas. The patient is not nervous/anxious.     Objective:  BP (!) 94/53   Pulse 85   Temp 98.4 F (36.9 C) (Oral)   Ht '5\' 6"'$  (1.676 m)   Wt 230 lb 6.4 oz (104.5 kg)   SpO2 93%   BMI 37.19 kg/m   BP/Weight 12/04/2016 11/21/2016 1/61/0960  Systolic BP 94 454 098  Diastolic BP 53 77 76  Wt. (Lbs) 230.4  238 226.8  BMI 37.19 38.41 36.61   Wt Readings from Last 3 Encounters:  12/04/16 230 lb 6.4 oz (104.5 kg)  11/21/16 238 lb (108 kg)  10/21/16 226 lb 12.8 oz (102.9 kg)    Physical Exam  Constitutional: He appears well-developed and well-nourished. No distress.  HENT:  Head: Normocephalic and atraumatic.  Mostly edentulous   Neck: Normal range of motion. Neck supple.  Cardiovascular: Normal rate, regular rhythm, normal heart sounds and intact distal pulses.   Pulmonary/Chest: Effort normal. No respiratory distress. He has no wheezes. He has no rales. He exhibits no tenderness.  Musculoskeletal: He exhibits no edema.  Neurological: He is alert.  Skin: Skin is warm and dry. No rash noted. No erythema.   Lab Results  Component Value Date   HGBA1C 6.9 12/04/2016   CBG 169  UA: trace leukocyte esterase, otherwise negative  Urine culture: < 10 K CFUs  EKG: NSR, no significant change since last tracing  Lab Results  Component Value Date   WBC 9.8 12/04/2016   HGB 13.8 09/09/2016   HCT 38.7 12/04/2016   MCV 92 12/04/2016   PLT 261 12/04/2016     Chemistry      Component Value Date/Time   NA 147 (H) 12/04/2016 1451   K 3.9 12/04/2016 1451   CL 103 12/04/2016 1451   CO2 29  12/04/2016 1451   BUN 24 12/04/2016 1451   CREATININE 1.59 (H) 12/04/2016 1451   CREATININE 1.42 (H) 09/09/2016 1037      Component Value Date/Time   CALCIUM 8.8 12/04/2016 1451   ALKPHOS 36 (L) 09/03/2016 0227   AST 24 09/03/2016 0227   ALT 35 09/03/2016 0227   BILITOT 0.7 09/03/2016 0227       Depression screen PHQ 2/9 11/21/2016 10/21/2016 09/23/2016  Decreased Interest 2 0 0  Down, Depressed, Hopeless '1 1 1  '$ PHQ - 2 Score '3 1 1  '$ Altered sleeping '3 3 3  '$ Tired, decreased energy '1 2 3  '$ Change in appetite (No Data) 0 0  Feeling bad or failure about yourself  0 0 0  Trouble concentrating 0 0 0  Moving slowly or fidgety/restless 0 0 0  Suicidal thoughts 0 0 0  PHQ-9 Score '7 6 7  '$ Some recent data might be hidden   GAD 7 : Generalized Anxiety Score 11/21/2016 10/21/2016 09/23/2016 09/09/2016  Nervous, Anxious, on Edge 0 0 0 0  Control/stop worrying 0 1 2 0  Worry too much - different things 0 '1 3 1  '$ Trouble relaxing 1 0 1 1  Restless 0 0 0 0  Easily annoyed or irritable 0 0 0 0  Afraid - awful might happen 0 0 0 0  Total GAD 7 Score '1 2 6 2    '$ Assessment & Plan:  Zaviyar was seen today for insomnia and chest pain.  Diagnoses and all orders for this visit:  Diabetes mellitus with complication ([place]) -     POCT glucose (manual entry) -     POCT glycosylated hemoglobin (Hb A1C) -     CBC -     Insulin Detemir (LEVEMIR FLEXTOUCH) 100 UNIT/ML Pen; Inject 50 Units into the skin daily at 10 pm.  Chest pain, unspecified type -     EKG 12-Lead -     Iron and TIBC -     Ferritin -     Brain natriuretic peptide -     BMP8+eGFR -     DG Chest 2  View; Future -     D-dimer, quantitative (not at [facility]) -     predniSONE (DELTASONE) 10 MG tablet; Start 12/04/16 Increase prednisone to 40 mg by mouth  for 2 days, 30 mg for 2 days, 20 mg for 2 days then back to 10 mg daily  AKI (acute kidney injury) ([place]) -     POCT urinalysis dipstick  Dysuria -     Urine culture  CKD (chronic kidney  disease) stage 3, GFR 30-59 ml/min  Other orders -     Discontinue: predniSONE (DELTASONE) 10 MG tablet; Take 1 tablet (10 mg total) by mouth daily with breakfast.     No orders of the defined types were placed in this encounter.   Follow-up: Return in about 4 weeks (around 01/01/2017) for chest pain and dyspnea.   Boykin Nearing MD

## 2016-12-05 LAB — BMP8+EGFR
BUN/Creatinine Ratio: 15 (ref 10–24)
BUN: 24 mg/dL (ref 8–27)
CALCIUM: 8.8 mg/dL (ref 8.6–10.2)
CO2: 29 mmol/L (ref 18–29)
CREATININE: 1.59 mg/dL — AB (ref 0.76–1.27)
Chloride: 103 mmol/L (ref 96–106)
GFR calc Af Amer: 53 mL/min/{1.73_m2} — ABNORMAL LOW (ref >59)
GFR, EST NON AFRICAN AMERICAN: 46 mL/min/{1.73_m2} — AB (ref >59)
Glucose: 164 mg/dL — ABNORMAL HIGH (ref 65–99)
POTASSIUM: 3.9 mmol/L (ref 3.5–5.2)
Sodium: 147 mmol/L — ABNORMAL HIGH (ref 134–144)

## 2016-12-05 LAB — FERRITIN: Ferritin: 69 ng/mL (ref 30–400)

## 2016-12-05 LAB — BRAIN NATRIURETIC PEPTIDE: BNP: 57.6 pg/mL (ref 0.0–100.0)

## 2016-12-05 LAB — CBC
Hematocrit: 38.7 % (ref 37.5–51.0)
Hemoglobin: 12.3 g/dL — ABNORMAL LOW (ref 13.0–17.7)
MCH: 29.4 pg (ref 26.6–33.0)
MCHC: 31.8 g/dL (ref 31.5–35.7)
MCV: 92 fL (ref 79–97)
PLATELETS: 261 10*3/uL (ref 150–379)
RBC: 4.19 x10E6/uL (ref 4.14–5.80)
RDW: 15.6 % — AB (ref 12.3–15.4)
WBC: 9.8 10*3/uL (ref 3.4–10.8)

## 2016-12-05 LAB — IRON AND TIBC
Iron Saturation: 28 % (ref 15–55)
Iron: 76 ug/dL (ref 38–169)
TIBC: 272 ug/dL (ref 250–450)
UIBC: 196 ug/dL (ref 111–343)

## 2016-12-05 LAB — D-DIMER, QUANTITATIVE: D-DIMER: 0.52 mg/L FEU — ABNORMAL HIGH (ref 0.00–0.49)

## 2016-12-06 LAB — URINE CULTURE

## 2016-12-08 ENCOUNTER — Other Ambulatory Visit: Payer: Self-pay

## 2016-12-08 ENCOUNTER — Telehealth: Payer: Self-pay | Admitting: Family Medicine

## 2016-12-08 MED ORDER — PREDNISONE 10 MG PO TABS: ORAL_TABLET | ORAL | 0 refills | Status: DC

## 2016-12-08 NOTE — Telephone Encounter (Signed)
Please inform patient that updated prednisone Rx sent Also please give lab results No findings of CHF exacerbation or PE

## 2016-12-08 NOTE — Assessment & Plan Note (Signed)
A: chest pain with shortness of breath. This is a chronic problem. His CBGs and diabetes is controlled. He is on lasix 40 mg daily for grade 2 diastolic dysfunction. No evidence of fluid overload.   P: Prednisone taper  CXR CBC without significant anemia CMP with improved renal function D dimer very slightly elevated but improved from past check and he has no VTE risk factors

## 2016-12-08 NOTE — Telephone Encounter (Signed)
Pt called stating that the script for prednisone was not enough to do what he was advised and a new script needs to be written. Also would like to have orders as to dosage on med. Thank you.

## 2016-12-08 NOTE — Assessment & Plan Note (Signed)
CKD stage 3 in setting of mixed CHF daily lasix, HTN, well controlled diabetes Improved with GFR up to 53 from 47

## 2016-12-08 NOTE — Telephone Encounter (Signed)
Will route to PCP 

## 2016-12-08 NOTE — Assessment & Plan Note (Signed)
Normal UA  Negative urine culture

## 2016-12-08 NOTE — Telephone Encounter (Signed)
Pt was called and informed of lab results and medication being sent to pharmacy.

## 2016-12-09 ENCOUNTER — Other Ambulatory Visit: Payer: Self-pay | Admitting: Family Medicine

## 2016-12-09 DIAGNOSIS — E119 Type 2 diabetes mellitus without complications: Secondary | ICD-10-CM

## 2016-12-09 DIAGNOSIS — Z794 Long term (current) use of insulin: Principal | ICD-10-CM

## 2016-12-22 ENCOUNTER — Other Ambulatory Visit: Payer: Self-pay | Admitting: Family Medicine

## 2016-12-22 DIAGNOSIS — K279 Peptic ulcer, site unspecified, unspecified as acute or chronic, without hemorrhage or perforation: Secondary | ICD-10-CM

## 2016-12-23 ENCOUNTER — Encounter: Payer: Self-pay | Admitting: Cardiology

## 2016-12-23 ENCOUNTER — Ambulatory Visit (INDEPENDENT_AMBULATORY_CARE_PROVIDER_SITE_OTHER): Payer: Medicaid Other | Admitting: Cardiology

## 2016-12-23 VITALS — BP 138/70 | HR 89 | Ht 66.0 in | Wt 228.8 lb

## 2016-12-23 DIAGNOSIS — I5042 Chronic combined systolic (congestive) and diastolic (congestive) heart failure: Secondary | ICD-10-CM

## 2016-12-23 DIAGNOSIS — E119 Type 2 diabetes mellitus without complications: Secondary | ICD-10-CM

## 2016-12-23 DIAGNOSIS — I4819 Other persistent atrial fibrillation: Secondary | ICD-10-CM

## 2016-12-23 DIAGNOSIS — I10 Essential (primary) hypertension: Secondary | ICD-10-CM | POA: Diagnosis not present

## 2016-12-23 DIAGNOSIS — I481 Persistent atrial fibrillation: Secondary | ICD-10-CM | POA: Diagnosis not present

## 2016-12-23 DIAGNOSIS — E7800 Pure hypercholesterolemia, unspecified: Secondary | ICD-10-CM

## 2016-12-23 DIAGNOSIS — R079 Chest pain, unspecified: Secondary | ICD-10-CM

## 2016-12-23 DIAGNOSIS — E785 Hyperlipidemia, unspecified: Secondary | ICD-10-CM | POA: Insufficient documentation

## 2016-12-23 DIAGNOSIS — Z794 Long term (current) use of insulin: Secondary | ICD-10-CM

## 2016-12-23 DIAGNOSIS — R002 Palpitations: Secondary | ICD-10-CM

## 2016-12-23 MED ORDER — SPIRONOLACTONE 25 MG PO TABS: 12.5000 mg | ORAL_TABLET | Freq: Every day | ORAL | 3 refills | Status: DC

## 2016-12-23 MED ORDER — LOSARTAN POTASSIUM 100 MG PO TABS: 100.0000 mg | ORAL_TABLET | Freq: Every day | ORAL | 3 refills | Status: DC

## 2016-12-23 NOTE — Patient Instructions (Signed)
Medication Instructions:  1) INCREASE LOSARTAN to 100 mg daily 2) START ALDACTONE 12.5 mg daily  Labwork: TODAY: BMET, BNP  THE SAME DAY AS YOUR STRESS TEST: BMET  Testing/Procedures: Your physician has requested that you have a lexiscan myoview. For further information please visit [email or web address]. Please follow instruction sheet, as given.  Your physician has recommended that you wear an event monitor. Event monitors are medical devices that record the heart's electrical activity. Doctors most often [place] these monitors to diagnose arrhythmias. Arrhythmias are problems with the speed or rhythm of the heartbeat. The monitor is a small, portable device. You can wear one while you do your normal daily activities. This is usually used to diagnose what is causing palpitations/syncope (passing out).  Follow-Up: Your physician recommends that you schedule a follow-up appointment in 3 MONTHS with [name].  Any Other Special Instructions Will Be Listed Below (If Applicable).     If you need a refill on your cardiac medications before your next appointment, please call your pharmacy.

## 2016-12-23 NOTE — Progress Notes (Signed)
Cardiology Office Note    Date:  12/23/2016   ID:  Marco [name], DOB [birth year], MRN [id number]  PCP:  Marco Nearing, MD  Cardiologist:  Marco Him, MD   Chief Complaint  Patient presents with  . Congestive Heart Failure  . Hypertension  . Atrial Fibrillation    History of Present Illness:  Marco [name] is a 61 y.o. male with a past medical history of DM, HTN, non-obstructive CAD, persistent atrial fibrillation, chronic systolic CHF (EF 68-12% by echo 08/2016) and essentially normal coronary angiography by cath in 2017 with extraluminal calcifications.    He is here for followup and is doing well.  He continues to have some problems with exertional chest tightness and DOE.  He describes it as a hard pressure and then he starts gasping for breath. He says that this occurs almost every time he exerts himself, even getting out of his car.  Sometimes he can just be sitting there and he can have CP. He uses his CPAP at night but only gets a few hours a night because he wakes up choking.  His OSA is followed by Dr. Annamaria [name].  He denies any dizziness or syncope.  He has also noticed some fluttering of his chest periodically.  He has has some recurrent problems with LE edema.  His weigh when he was discharged in January was 225lbs.  He weight himself daily at home and it runs 226-228lbs at home. He denies any table salt use and does not eat out any.    Past Medical History:  Diagnosis Date  . Benign colon polyp 08/01/2013   [facility] in [place]. large base tranverse colon polyp was biopsied. polyp was benign with minimal surface hyperplastic change.  . Chronic combined systolic and diastolic CHF (congestive heart failure) (Marco [name])   . Chronic pain   . Diabetes mellitus without complication (Marco [name])   . Diverticulosis   . Esophageal hiatal hernia 07/29/2013   confirmed on EGD   . Esophageal stricture   . Essential hypertension   . Gastritis 07/29/2013   confirmed on EGD, bx  done an negative for intestinal metaplasia, dsyplasia or H. pylori. normal gastric emptying study done 07/13/2013.  [name] GERD (gastroesophageal reflux disease)   . Morbid obesity (Marco [name])   . Non-obstructive CAD    a. 02/2013 Cath ([place]): nonobs dzs;  b. 09/2014 Myoview ([place]): EF 55%, no ischemia;  c. 12/2014 Echo: EF 50-55%, gr1 DD, no effusion.  [name] PAF (paroxysmal atrial fibrillation) (Marco [name])    a. 02/2013 s/p rfca in [place], [place]-->prev on Xarelto, d/c'd 2/2 anemia, ? GIB.  [name] Rheumatoid arthritis (Marco [name])   . Sigmoid diverticulosis 08/01/2013   confirmed on colonscopy. record scanned into chart    Past Surgical History:  Procedure Laterality Date  . CARDIAC CATHETERIZATION  05/2014   ablation for atrial fibrillation  . CARDIAC CATHETERIZATION N/A 11/16/2015   Procedure: Left Heart Cath and Coronary Angiography;  Surgeon: Marco Man, MD;  Location: [facility];  Service: Cardiovascular;  Laterality: N/A;  . COLON RESECTION  09/2013   due to large, abnormal polpy. non cancerous per patient.   . COLON SURGERY  09/2014   colon resection     Current Medications: Current Meds  Medication Sig  . ACCU-CHEK AVIVA PLUS test strip USE ONE STRIP TO CHECK BLOOD GLUCOSE 3 TIMES DAILY  . ACCU-CHEK SOFTCLIX LANCETS lancets USE AS DIRECTED  . ACCU-CHEK SOFTCLIX LANCETS lancets 1 each by Other route 3 (  three) times daily. Use as instructed  . albuterol (PROVENTIL HFA;VENTOLIN HFA) 108 (90 Base) MCG/ACT inhaler Inhale 2 puffs into the lungs every 6 (six) hours as needed for wheezing or shortness of breath.  [name] atorvastatin (LIPITOR) 40 MG tablet TAKE 1 TABLET BY MOUTH ONCE DAILY  . BD PEN NEEDLE NANO U/F 32G X 4 MM MISC USE ONE PEN NEEDLE THREE TIMES DAILY  . BIDIL 20-37.5 MG tablet TAKE 1 TABLET BY MOUTH THREE TIMES DAILY  . Blood Glucose Monitoring Suppl (ACCU-CHEK AVIVA PLUS) W/DEVICE KIT 1 Device by Does not apply route 3 (three) times daily after meals. E11.9  . Blood Pressure KIT 1 each by Does not  apply route 2 (two) times daily.  . carvedilol (COREG) 25 MG tablet Take 1 tablet (25 mg total) by mouth 2 (two) times daily.  . EQ ASPIRIN ADULT LOW DOSE 81 MG EC tablet TAKE ONE TABLET BY MOUTH ONCE DAILY  . Eszopiclone 3 MG TABS Take 1 tablet (3 mg total) by mouth at bedtime. Take immediately before bedtime  . furosemide (LASIX) 40 MG tablet Take 1 tablet (40 mg total) by mouth daily. Take an extra 40 mg dose if weight up 3 lbs from previous day  . glucose blood (ACCU-CHEK AVIVA PLUS) test strip 1 each by Other route 3 (three) times daily. E11.9  . HYDROcodone-acetaminophen (NORCO/VICODIN) 5-325 MG tablet Take 1 tablet by mouth every 8 (eight) hours as needed for moderate pain. Per pain management  . insulin aspart (NOVOLOG FLEXPEN) 100 UNIT/ML FlexPen Inject 14-24 Units into the skin 3 (three) times daily with meals.  . Insulin Detemir (LEVEMIR FLEXTOUCH) 100 UNIT/ML Pen Inject 50 Units into the skin daily at 10 pm.  . lidocaine (XYLOCAINE) 5 % ointment Apply 1 application topically 4 (four) times daily as needed. Apply to feet  . losartan (COZAAR) 100 MG tablet Take 0.5 tablets (50 mg total) by mouth daily.  . metFORMIN (GLUCOPHAGE-XR) 500 MG 24 hr tablet TAKE TWO TABLETS BY MOUTH TWICE DAILY  . predniSONE (DELTASONE) 10 MG tablet Start 12/04/16 Increase prednisone to 40 mg by mouth  for 2 days, 30 mg for 2 days, 20 mg for 2 days then back to 10 mg daily  . pregabalin (LYRICA) 100 MG capsule Take 1 capsule (100 mg total) by mouth 3 (three) times daily.  . ranitidine (ZANTAC) 150 MG tablet TAKE 1 TABLET BY MOUTH AT BEDTIME  . Secukinumab (COSENTYX) 150 MG/ML SOSY Inject 150 mg into the skin every 28 (twenty-eight) days.   [name] umeclidinium-vilanterol (ANORO ELLIPTA) 62.5-25 MCG/INH AEPB Inhale 1 puff into the lungs daily.  . vitamin B-12 (CYANOCOBALAMIN) 1000 MCG tablet Take 1 tablet (1,000 mcg total) by mouth daily.    Allergies:   Patient has no known allergies.   Social History   Social  History  . Marital status: Married    Spouse name: Amethyst  . Number of children: 8  . Years of education: 75   Occupational History  .      disabled   Social History Main Topics  . Smoking status: Former Smoker    Packs/day: 0.50    Years: 10.00    Types: Cigarettes    Quit date: 04/11/2008  . Smokeless tobacco: Never Used  . Alcohol use No  . Drug use: No  . Sexual activity: Not Asked   Other Topics Concern  . None   Social History Narrative   Lives with wife. Does not work.  On disability.  Caffeine- coffee, 1/2 cup daily     Family History:  The patient's family history includes COPD in his father and mother; Cancer in his mother; Diabetes in his father and mother; Heart Problems in his father; Hypertension in his father and mother.   ROS:   Please see the history of present illness.    ROS All other systems reviewed and are negative.  No flowsheet data found.     PHYSICAL EXAM:   VS:  BP 138/70   Pulse 89   Ht 5' 6" (1.676 m)   Wt 228 lb 12.8 oz (103.8 kg)   BMI 36.93 kg/m    GEN: Well nourished, well developed, in no acute distress  HEENT: normal  Neck: no JVD, carotid bruits, or masses Cardiac: RRR; no murmurs, rubs, or gallops,no edema.  Intact distal pulses bilaterally.  Respiratory:  clear to auscultation bilaterally, normal work of breathing GI: soft, nontender, nondistended, + BS MS: no deformity or atrophy  Skin: warm and dry, no rash Neuro:  Alert and Oriented x 3, Strength and sensation are intact Psych: euthymic mood, full affect  Wt Readings from Last 3 Encounters:  12/23/16 228 lb 12.8 oz (103.8 kg)  12/04/16 230 lb 6.4 oz (104.5 kg)  11/21/16 238 lb (108 kg)      Studies/Labs Reviewed:   EKG:  EKG is not ordered today.    Recent Labs: 09/03/2016: ALT 35 09/09/2016: Hemoglobin 13.8 09/25/2016: NT-Pro BNP 208 12/04/2016: BNP 57.6; BUN 24; Creatinine, Ser 1.59; Platelets 261; Potassium 3.9; Sodium 147   Lipid Panel    Component  Value Date/Time   CHOL 92 (L) 06/10/2016 1253   TRIG 91 06/10/2016 1253   HDL 35 (L) 06/10/2016 1253   CHOLHDL 2.6 06/10/2016 1253   VLDL 18 06/10/2016 1253   LDLCALC 39 06/10/2016 1253    Additional studies/ records that were reviewed today include:  none    ASSESSMENT:    1. Chronic combined systolic and diastolic CHF (congestive heart failure) ([place])   2. Essential hypertension   3. Persistent atrial fibrillation ([place])   4. Pure hypercholesterolemia   5. Chest pain, unspecified type      PLAN:  In order of problems listed above:  1. Chronic combined systolic and diastolic CHF - he appears euvolemic on exam today.  His weight is stable but he continues to have episodes of exertional SOB and CP.  His cath a year ago showed only extraluminal calcification so unlikely to be due to coronary ischemia.  He tries to follow a strict 2gm sodium diet.  I will check a BNP today. He will continue on carvedilol 2m BID, Bidil 20-37.551mTID and Lasix 4053maily with additional sliding scale.  I am going to increase his Losartan to 100m35mily and add aldactone 12.5mg 20mly.  I will check a BMET in 1 week.    2. HTN - his BP is adequately controlled on exam today. He will continue on Bidil, ARB and BB.  3. Persistent atrial fibrillation s/p ablation - he is maintaining NSR.  He will continue on carvedilol. He is not on anticoagulation due to history of GIB and anemia. He has been having palpitations so I will get an event monitor.   4. Hyperlipidemia - his LDL was 39 in 05/2016.  I will repeat an FLP and ALT. He will continue on atorvastatin 40mg 8my.  5.   Chest pain - ? Etiology - his cath a year ago showed on ly extraluminal  calcium and no obstructive disease. He could have microvascular disease.  Will continue on nitrates.  I will get a Lexiscan myoview to assess further.     Medication Adjustments/Labs and Tests Ordered: Current medicines are reviewed at length with the patient  today.  Concerns regarding medicines are outlined above.  Medication changes, Labs and Tests ordered today are listed in the Patient Instructions below.  There are no Patient Instructions on file for this visit.   Signed, Marco Him, MD  12/23/2016 11:43 AM    [facility] [address], [place], [place]  [address] Phone: [phone]; Fax: [phone]

## 2016-12-24 ENCOUNTER — Encounter: Payer: Self-pay | Admitting: Family Medicine

## 2016-12-24 LAB — BASIC METABOLIC PANEL
BUN/Creatinine Ratio: 14 (ref 10–24)
BUN: 23 mg/dL (ref 8–27)
CHLORIDE: 98 mmol/L (ref 96–106)
CO2: 32 mmol/L — AB (ref 18–29)
Calcium: 9.4 mg/dL (ref 8.6–10.2)
Creatinine, Ser: 1.62 mg/dL — ABNORMAL HIGH (ref 0.76–1.27)
GFR, EST AFRICAN AMERICAN: 52 mL/min/{1.73_m2} — AB (ref >59)
GFR, EST NON AFRICAN AMERICAN: 45 mL/min/{1.73_m2} — AB (ref >59)
Glucose: 130 mg/dL — ABNORMAL HIGH (ref 65–99)
POTASSIUM: 3.8 mmol/L (ref 3.5–5.2)
SODIUM: 148 mmol/L — AB (ref 134–144)

## 2016-12-24 LAB — PRO B NATRIURETIC PEPTIDE: NT-PRO BNP: 286 pg/mL — AB (ref 0–210)

## 2016-12-25 ENCOUNTER — Telehealth: Payer: Self-pay

## 2016-12-25 DIAGNOSIS — R0602 Shortness of breath: Secondary | ICD-10-CM

## 2016-12-25 DIAGNOSIS — I5043 Acute on chronic combined systolic (congestive) and diastolic (congestive) heart failure: Secondary | ICD-10-CM

## 2016-12-25 MED ORDER — FUROSEMIDE 40 MG PO TABS: ORAL_TABLET | ORAL | 11 refills | Status: DC

## 2016-12-25 NOTE — Telephone Encounter (Signed)
-----   Message from Sueanne Margarita, MD sent at 12/25/2016 12:44 PM EDT ----- BNP mildly elevated - increase Lasix to 40mg  BID for 3 days and then decrease to 40mg  qam and 20mg  qPM.  Repeat BMET in 1 week with BNP.  Please have him seen by extender in 2 weeks to make sure his SOB has improved

## 2016-12-25 NOTE — Telephone Encounter (Signed)
Informed patient of results and verbal understanding expressed.  Instructed patient to INCREASE LASIX to 40 mg BID for 3 days and then decrease to 40 mg qAM and 20 mg qPM. BNP to be drawn 5/24 with other testing. Follow-up OV scheduled 6/4. Patient agrees with treatment plan.

## 2016-12-27 ENCOUNTER — Encounter: Payer: Self-pay | Admitting: Internal Medicine

## 2016-12-30 ENCOUNTER — Encounter: Payer: Self-pay | Admitting: Internal Medicine

## 2016-12-30 ENCOUNTER — Ambulatory Visit (INDEPENDENT_AMBULATORY_CARE_PROVIDER_SITE_OTHER): Payer: Medicaid Other | Admitting: Internal Medicine

## 2016-12-30 ENCOUNTER — Other Ambulatory Visit: Payer: Self-pay | Admitting: Family Medicine

## 2016-12-30 ENCOUNTER — Telehealth (HOSPITAL_COMMUNITY): Payer: Self-pay | Admitting: *Deleted

## 2016-12-30 VITALS — BP 126/72 | HR 76 | Ht 66.0 in | Wt 227.2 lb

## 2016-12-30 DIAGNOSIS — G4733 Obstructive sleep apnea (adult) (pediatric): Secondary | ICD-10-CM

## 2016-12-30 DIAGNOSIS — K279 Peptic ulcer, site unspecified, unspecified as acute or chronic, without hemorrhage or perforation: Secondary | ICD-10-CM | POA: Diagnosis not present

## 2016-12-30 NOTE — Patient Instructions (Signed)
Order- DME [facility]- please change BIPAP to 16/ 12, continue mask of choice, humidifier, supplies, AirView                                      Dx OSA   Try taking your Ranitadine in the morning instead of at night, to see if it helps the dry feeling while you are trying to sleep  Try again with otc Biotene for dry mouth, I suggest you get the oral rinse, and swish well with it every night at bedtime. Repeat as needed.

## 2016-12-30 NOTE — Progress Notes (Signed)
HPI male former smoker followed for OSA/ Insomnia, dyspnea on exertion, complicated by chronic pain, anxiety, DM 2, history cardiac ablation, gout NPSG 37.8/hour on 06/14/2015, desaturation to 76%, body weight 223 pounds BiPAP titration 08/19/2015- to 20/16 Barium Swallow 02/07/2016-normal CXR 01/22/2016-NAD PFT 02/22/2016-minimal restriction, mild diffusion deficit. FVC 2.62/76%, FEV1 2.14/81%, ratio 0.82, DLCO 61%, TLC 79%, RV/TLC 135%. 6MWT- 02/22/16-97%, 98%, 98%. Stopped after only 70 m complaining of chest tightness, headache, shortness of breath, dizziness. BP and heart rate were unremarkable. Unattended Home Sleep Test-05/25/2016-AHI 36.9/hour, desaturation to 82%, body weight 212 pounds 05/15/2016-61 year old male former smoker followed for OSA, dyspnea on exertion, complicated by chronic pain, DM 2, history cardiac ablation BIPAP 20/26   ----------------------------------------------------------------------------  09/16/2016-61 year old male former smoker followed for OSA/ Insomnia, dyspnea on exertion, complicated by chronic pain, anxiety, DM 2, history cardiac ablation, gout CPAP 16/ [facility] 4 mo f/u for OSA. Was in the hospital on 1/23 for shortness of breath. Feels a little better now.  [facility] 1/23-1/27/18-acute hypoxic respiratory failure, CAP (cx neg) and CHF, treated with Solu-Medrol, Rocephin/Zithromax Has cardiology follow-up appointment this month with [name]. Breathing nearly back to normal-still a little more dyspneic than usual with exertion. No longer coughing, no phlegm.  We reviewed CPAP download showing missed days including recent hospital stay and emphasizing benefits of using CPAP all night every night CTa chest 09/02/2016 IMPRESSION: S cardiomegaly, coronary artery disease. Bilateral airspace opacities diffusely throughout the lungs, most confluent in the lower lobes bilaterally. This could represent edema or infection. Borderline sized mesenteric  lymph nodes, likely reactive or related to congestion. Small bilateral pleural effusions  12/30/16- 61 year old male former smoker followed for OSA/ Insomnia, dyspnea on exertion, complicated by chronic pain, anxiety, DM 2, history cardiac ablation, gout BIPAP 16/ 16 [facility] patient is using BI-PAP intermittently and contiunes to have a dry mouth. DME is [facility] he says he can't tell that his BiPAP makes much difference either way. Download-17%/4 hour compliance, AHI 6.9/hour He still has his chronic complaint that using CPAP dries him out so he wakes choking and gagging. Also complains his chest gets tight and he can't breathe comfortably. This has been an intermittent, non-positional disturbance. Cardiology evaluation continues. Uses Lunesta 3 mg for sleep.  ROS-see HPI    "+" = pos Constitutional:    weight loss, night sweats, fevers, chills, fatigue, lassitude. HEENT:    headaches, + difficulty swallowing, tooth/dental problems, sore throat,       sneezing, itching, ear ache, nasal congestion, post nasal drip, snoring CV:     chest pain, orthopnea, PND, + swelling in lower extremities, anasarca,                                                          dizziness, palpitations Resp:  + shortness of breath with exertion or at rest.                productive cough,   non-productive cough, coughing up of blood.              change in color of mucus.  wheezing.   Skin:    rash or lesions. GI:  No-   heartburn, indigestion, + abdominal pain, nausea, vomiting, diarrhea,  change in bowel habits, loss of appetite GU: dysuria, change in color of urine, no urgency or frequency.   flank pain. MS:   + joint pain, stiffness, decreased range of motion, back pain. Neuro-     nothing unusual Psych:  change in mood or affect.  depression or anxiety.   memory loss.  OBJ- Physical Exam General- Alert, Oriented, Affect-appropriate, Distress- none acute, + overweight  Skin- rash-none, lesions-  none, excoriation- none Lymphadenopathy- none Head- atraumatic            Eyes- Gross vision intact, PERRLA, conjunctivae and secretions clear            Ears- Hearing, canals-normal            Nose- Clear, no-Septal dev, mucus, polyps, erosion, perforation             Throat- Mallampati IV , mucosa clear-not particularly dry , drainage- none, tonsils- atrophic, +many missing teeth Neck- flexible , trachea midline, no stridor , thyroid nl, carotid no bruit Chest - symmetrical excursion , unlabored           Heart/CV- RRR , no murmur , no gallop  , no rub, nl s1 s2                           - JVD- none , edema- none, stasis changes- none, varices- none           Lung- clear to P&A, wheeze- none, cough- none , dullness-none, rub- none           Chest wall-  Abd-  Br/ Gen/ Rectal- Not done, not indicated Extrem- cyanosis- none, clubbing, none, atrophy- none, strength- nl Neuro- grossly intact to observation

## 2016-12-30 NOTE — Telephone Encounter (Signed)
Patient given detailed instructions per Myocardial Perfusion Study Information Sheet for the test on 01/01/17 at 0945. Patient notified to arrive 15 minutes early and that it is imperative to arrive on time for appointment to keep from having the test rescheduled.  If you need to cancel or reschedule your appointment, please call the office within 24 hours of your appointment. . Patient verbalized understanding.Jaquasha [place]nev[place], Ranae [place]

## 2016-12-30 NOTE — Assessment & Plan Note (Signed)
He is taking ranitidine once a day. I asked him to try switching from bedtime to morning to see if that affects his dry mouth complaint.

## 2016-12-30 NOTE — Assessment & Plan Note (Signed)
He is not using his Pap therapy enough to benefit. We discussed this again. He keeps citing dry mouth and nonspecific choking at night for making mask uncomfortable. Plan-try altering BiPAP to 16/12 and make a regular habit of Biotene mouth rinse at bedtime. Try taking ranitidine in the morning instead of at night to see if that helps with dry mouth.

## 2017-01-01 ENCOUNTER — Other Ambulatory Visit: Payer: Medicaid Other | Admitting: *Deleted

## 2017-01-01 ENCOUNTER — Ambulatory Visit (INDEPENDENT_AMBULATORY_CARE_PROVIDER_SITE_OTHER): Payer: Medicaid Other

## 2017-01-01 ENCOUNTER — Ambulatory Visit (HOSPITAL_COMMUNITY): Payer: Medicaid Other | Attending: Cardiovascular Disease

## 2017-01-01 DIAGNOSIS — R079 Chest pain, unspecified: Secondary | ICD-10-CM

## 2017-01-01 DIAGNOSIS — R002 Palpitations: Secondary | ICD-10-CM

## 2017-01-01 DIAGNOSIS — I10 Essential (primary) hypertension: Secondary | ICD-10-CM

## 2017-01-01 DIAGNOSIS — R0602 Shortness of breath: Secondary | ICD-10-CM

## 2017-01-01 DIAGNOSIS — I5042 Chronic combined systolic (congestive) and diastolic (congestive) heart failure: Secondary | ICD-10-CM

## 2017-01-01 LAB — BASIC METABOLIC PANEL
BUN/Creatinine Ratio: 10 (ref 10–24)
BUN: 14 mg/dL (ref 8–27)
CO2: 26 mmol/L (ref 18–29)
Calcium: 9.1 mg/dL (ref 8.6–10.2)
Chloride: 99 mmol/L (ref 96–106)
Creatinine, Ser: 1.34 mg/dL — ABNORMAL HIGH (ref 0.76–1.27)
GFR, EST AFRICAN AMERICAN: 66 mL/min/{1.73_m2} (ref >59)
GFR, EST NON AFRICAN AMERICAN: 57 mL/min/{1.73_m2} — AB (ref >59)
Glucose: 134 mg/dL — ABNORMAL HIGH (ref 65–99)
POTASSIUM: 4 mmol/L (ref 3.5–5.2)
SODIUM: 147 mmol/L — AB (ref 134–144)

## 2017-01-01 LAB — MYOCARDIAL PERFUSION IMAGING
CHL CUP NUCLEAR SDS: 1
CHL CUP NUCLEAR SSS: 6
CSEPPHR: 99 {beats}/min
LHR: 0.29
LV dias vol: 137 mL (ref 62–150)
LV sys vol: 77 mL
Rest HR: 88 {beats}/min
SRS: 5
TID: 1.11

## 2017-01-01 LAB — PRO B NATRIURETIC PEPTIDE: NT-Pro BNP: 180 pg/mL (ref 0–210)

## 2017-01-01 MED ORDER — REGADENOSON 0.4 MG/5ML IV SOLN
0.4000 mg | Freq: Once | INTRAVENOUS | Status: AC
  Administered 2017-01-01: 0.4 mg via INTRAVENOUS

## 2017-01-01 MED ORDER — TECHNETIUM TC 99M TETROFOSMIN IV KIT
32.7000 | PACK | Freq: Once | INTRAVENOUS | Status: AC | PRN
  Administered 2017-01-01: 32.7 via INTRAVENOUS
  Filled 2017-01-01: qty 33

## 2017-01-01 MED ORDER — TECHNETIUM TC 99M TETROFOSMIN IV KIT
10.3000 | PACK | Freq: Once | INTRAVENOUS | Status: AC | PRN
  Administered 2017-01-01: 10.3 via INTRAVENOUS
  Filled 2017-01-01: qty 11

## 2017-01-07 ENCOUNTER — Telehealth: Payer: Self-pay | Admitting: *Deleted

## 2017-01-07 DIAGNOSIS — I5043 Acute on chronic combined systolic (congestive) and diastolic (congestive) heart failure: Secondary | ICD-10-CM

## 2017-01-07 DIAGNOSIS — I5042 Chronic combined systolic (congestive) and diastolic (congestive) heart failure: Secondary | ICD-10-CM

## 2017-01-07 DIAGNOSIS — R9439 Abnormal result of other cardiovascular function study: Secondary | ICD-10-CM

## 2017-01-07 DIAGNOSIS — I2510 Atherosclerotic heart disease of native coronary artery without angina pectoris: Secondary | ICD-10-CM

## 2017-01-07 MED ORDER — FUROSEMIDE 40 MG PO TABS: ORAL_TABLET | ORAL | 6 refills | Status: DC

## 2017-01-07 NOTE — Telephone Encounter (Signed)
Notes recorded by Sueanne Margarita, MD on 01/02/2017 at 2:08 PM EDT BNP now normal. Please have him change Lasix to 40mg  qam only and no nighttime dose as sodium remains elevated. Repeat BMET in 1 week  Done see lab results 01/01/17   Notes recorded by Sueanne Margarita, MD on 01/02/2017 at 2:38 PM EDT Low risk stress test with apical septal and apical defect secondary to ? Small prior distal septal/apical infarct with no ischemia and EF 44%. The defect is much worse on resting images and cath a year ago showed no significant disease. Suspect this is artifact. Please repeat a limited echo for wall motion to compare and Please find out if he has had any further CP  Done-see echo results 01/01/17.

## 2017-01-08 ENCOUNTER — Encounter: Payer: Self-pay | Admitting: Family Medicine

## 2017-01-08 ENCOUNTER — Other Ambulatory Visit: Payer: Self-pay

## 2017-01-08 ENCOUNTER — Ambulatory Visit: Payer: Medicaid Other | Attending: Family Medicine | Admitting: Family Medicine

## 2017-01-08 VITALS — BP 92/64 | HR 72 | Temp 98.3°F | Wt 224.4 lb

## 2017-01-08 DIAGNOSIS — Z87891 Personal history of nicotine dependence: Secondary | ICD-10-CM | POA: Diagnosis not present

## 2017-01-08 DIAGNOSIS — I952 Hypotension due to drugs: Secondary | ICD-10-CM

## 2017-01-08 DIAGNOSIS — Z7952 Long term (current) use of systemic steroids: Secondary | ICD-10-CM | POA: Diagnosis not present

## 2017-01-08 DIAGNOSIS — R1310 Dysphagia, unspecified: Secondary | ICD-10-CM

## 2017-01-08 DIAGNOSIS — I509 Heart failure, unspecified: Secondary | ICD-10-CM | POA: Insufficient documentation

## 2017-01-08 DIAGNOSIS — G8929 Other chronic pain: Secondary | ICD-10-CM | POA: Diagnosis not present

## 2017-01-08 DIAGNOSIS — E118 Type 2 diabetes mellitus with unspecified complications: Secondary | ICD-10-CM | POA: Diagnosis not present

## 2017-01-08 DIAGNOSIS — I110 Hypertensive heart disease with heart failure: Secondary | ICD-10-CM | POA: Diagnosis not present

## 2017-01-08 DIAGNOSIS — Z7982 Long term (current) use of aspirin: Secondary | ICD-10-CM | POA: Insufficient documentation

## 2017-01-08 DIAGNOSIS — Z79899 Other long term (current) drug therapy: Secondary | ICD-10-CM | POA: Diagnosis not present

## 2017-01-08 DIAGNOSIS — Z9119 Patient's noncompliance with other medical treatment and regimen: Secondary | ICD-10-CM | POA: Insufficient documentation

## 2017-01-08 DIAGNOSIS — I2510 Atherosclerotic heart disease of native coronary artery without angina pectoris: Secondary | ICD-10-CM | POA: Insufficient documentation

## 2017-01-08 DIAGNOSIS — I10 Essential (primary) hypertension: Secondary | ICD-10-CM | POA: Diagnosis present

## 2017-01-08 DIAGNOSIS — R1319 Other dysphagia: Secondary | ICD-10-CM

## 2017-01-08 DIAGNOSIS — Z794 Long term (current) use of insulin: Secondary | ICD-10-CM | POA: Diagnosis not present

## 2017-01-08 DIAGNOSIS — F5101 Primary insomnia: Secondary | ICD-10-CM

## 2017-01-08 DIAGNOSIS — G4733 Obstructive sleep apnea (adult) (pediatric): Secondary | ICD-10-CM | POA: Diagnosis not present

## 2017-01-08 DIAGNOSIS — M109 Gout, unspecified: Secondary | ICD-10-CM | POA: Insufficient documentation

## 2017-01-08 DIAGNOSIS — L409 Psoriasis, unspecified: Secondary | ICD-10-CM | POA: Diagnosis not present

## 2017-01-08 LAB — GLUCOSE, POCT (MANUAL RESULT ENTRY): POC Glucose: 140 mg/dl — AB (ref 70–99)

## 2017-01-08 MED ORDER — ESZOPICLONE 3 MG PO TABS: 3.0000 mg | ORAL_TABLET | Freq: Every day | ORAL | 5 refills | Status: DC

## 2017-01-08 MED ORDER — SODIUM CHLORIDE 0.9 % IV BOLUS (SEPSIS)
1000.0000 mL | Freq: Once | INTRAVENOUS | Status: AC
  Administered 2017-01-08: 1000 mL via INTRAVENOUS

## 2017-01-08 NOTE — Progress Notes (Deleted)
Cardiology Office Note    Date:  01/08/2017   ID:  Marco [name], DOB 04/06/1956, MRN [id number]  PCP:  Boykin Nearing, MD  Cardiologist:   No chief complaint on file.   History of Present Illness:  Marco [name] is a 61 y.o. male ***    Past Medical History:  Diagnosis Date  . Benign colon polyp 08/01/2013   [facility] in [place]. large base tranverse colon polyp was biopsied. polyp was benign with minimal surface hyperplastic change.  . Chronic combined systolic and diastolic CHF (congestive heart failure) ([place])   . Chronic pain   . Diabetes mellitus without complication ([place])   . Diverticulosis   . Esophageal hiatal hernia 07/29/2013   confirmed on EGD   . Esophageal stricture   . Essential hypertension   . Gastritis 07/29/2013   confirmed on EGD, bx done an negative for intestinal metaplasia, dsyplasia or H. pylori. normal gastric emptying study done 07/13/2013.  [name] GERD (gastroesophageal reflux disease)   . Morbid obesity ([place])   . Non-obstructive CAD    a. 02/2013 Cath ([place]): nonobs dzs;  b. 09/2014 Myoview ([place]): EF 55%, no ischemia;  c. 12/2014 Echo: EF 50-55%, gr1 DD, no effusion.  [name] PAF (paroxysmal atrial fibrillation) ([place])    a. 02/2013 s/p rfca in [place], [place]-->prev on Xarelto, d/c'd 2/2 anemia, ? GIB.  [name] Rheumatoid arthritis ([place])   . Sigmoid diverticulosis 08/01/2013   confirmed on colonscopy. record scanned into chart    Past Surgical History:  Procedure Laterality Date  . CARDIAC CATHETERIZATION  05/2014   ablation for atrial fibrillation  . CARDIAC CATHETERIZATION N/A 11/16/2015   Procedure: Left Heart Cath and Coronary Angiography;  Surgeon: Leonie Man, MD;  Location: [facility];  Service: Cardiovascular;  Laterality: N/A;  . COLON RESECTION  09/2013   due to large, abnormal polpy. non cancerous per patient.   . COLON SURGERY  09/2014   colon resection     Current Medications: Outpatient Medications Prior to Visit    Medication Sig Dispense Refill  . ACCU-CHEK AVIVA PLUS test strip USE ONE STRIP TO CHECK BLOOD GLUCOSE 3 TIMES DAILY 100 each 12  . ACCU-CHEK SOFTCLIX LANCETS lancets USE AS DIRECTED 100 each 11  . ACCU-CHEK SOFTCLIX LANCETS lancets 1 each by Other route 3 (three) times daily. Use as instructed 100 each 12  . albuterol (PROVENTIL HFA;VENTOLIN HFA) 108 (90 Base) MCG/ACT inhaler Inhale 2 puffs into the lungs every 6 (six) hours as needed for wheezing or shortness of breath. 1 Inhaler 2  . atorvastatin (LIPITOR) 40 MG tablet TAKE 1 TABLET BY MOUTH ONCE DAILY 90 tablet 0  . BD PEN NEEDLE NANO U/F 32G X 4 MM MISC USE ONE PEN NEEDLE THREE TIMES DAILY 100 each 11  . BIDIL 20-37.5 MG tablet TAKE 1 TABLET BY MOUTH THREE TIMES DAILY 270 tablet 0  . Blood Glucose Monitoring Suppl (ACCU-CHEK AVIVA PLUS) W/DEVICE KIT 1 Device by Does not apply route 3 (three) times daily after meals. E11.9 1 kit 0  . Blood Pressure KIT 1 each by Does not apply route 2 (two) times daily. 1 each 0  . carvedilol (COREG) 25 MG tablet TAKE 1 TABLET BY MOUTH TWICE DAILY 180 tablet 0  . EQ ASPIRIN ADULT LOW DOSE 81 MG EC tablet TAKE ONE TABLET BY MOUTH ONCE DAILY 90 tablet 0  . Eszopiclone 3 MG TABS Take 1 tablet (3 mg total) by mouth at bedtime. Take  immediately before bedtime 30 tablet 2  . furosemide (LASIX) 40 MG tablet Take 1 tablet by mouth every morning 30 tablet 6  . glucose blood (ACCU-CHEK AVIVA PLUS) test strip 1 each by Other route 3 (three) times daily. E11.9 100 each 12  . HYDROcodone-acetaminophen (NORCO/VICODIN) 5-325 MG tablet Take 1 tablet by mouth every 8 (eight) hours as needed for moderate pain. Per pain management    . insulin aspart (NOVOLOG FLEXPEN) 100 UNIT/ML FlexPen Inject 14-24 Units into the skin 3 (three) times daily with meals. 15 mL 3  . Insulin Detemir (LEVEMIR FLEXTOUCH) 100 UNIT/ML Pen Inject 50 Units into the skin daily at 10 pm. 15 mL 3  . lidocaine (XYLOCAINE) 5 % ointment Apply 1 application  topically 4 (four) times daily as needed. Apply to feet 35.44 g 2  . losartan (COZAAR) 100 MG tablet Take 1 tablet (100 mg total) by mouth daily. 90 tablet 3  . metFORMIN (GLUCOPHAGE-XR) 500 MG 24 hr tablet TAKE TWO TABLETS BY MOUTH TWICE DAILY 360 tablet 0  . predniSONE (DELTASONE) 10 MG tablet Start 12/04/16 Increase prednisone to 40 mg by mouth  for 2 days, 30 mg for 2 days, 20 mg for 2 days then back to 10 mg daily 42 tablet 0  . pregabalin (LYRICA) 100 MG capsule Take 1 capsule (100 mg total) by mouth 3 (three) times daily. 90 capsule 5  . ranitidine (ZANTAC) 150 MG tablet TAKE 1 TABLET BY MOUTH AT BEDTIME 90 tablet 0  . Secukinumab (COSENTYX) 150 MG/ML SOSY Inject 150 mg into the skin every 28 (twenty-eight) days.     [name] spironolactone (ALDACTONE) 25 MG tablet Take 0.5 tablets (12.5 mg total) by mouth daily. 45 tablet 3  . vitamin B-12 (CYANOCOBALAMIN) 1000 MCG tablet Take 1 tablet (1,000 mcg total) by mouth daily. 90 tablet 3   No facility-administered medications prior to visit.      Allergies:   Patient has no allergy information on record.   Social History   Social History  . Marital status: Married    Spouse name: Amethyst  . Number of children: 8  . Years of education: 43   Occupational History  .      disabled   Social History Main Topics  . Smoking status: Former Smoker    Packs/day: 0.50    Years: 10.00    Types: Cigarettes    Quit date: 04/11/2008  . Smokeless tobacco: Never Used  . Alcohol use No  . Drug use: No  . Sexual activity: Not on file   Other Topics Concern  . Not on file   Social History Narrative   Lives with wife. Does not work.  On disability.    Caffeine- coffee, 1/2 cup daily     Family History:  The patient's ***family history includes COPD in his father and mother; Cancer in his mother; Diabetes in his father and mother; Heart Problems in his father; Hypertension in his father and mother.   ROS:   Please see the history of present  illness.    ROS All other systems reviewed and are negative.   PHYSICAL EXAM:   VS:  There were no vitals taken for this visit.  Physical Exam  GEN: Well nourished, well developed, in no acute distress HEENT: normal Neck: no JVD, carotid bruits, or masses Cardiac:RRR; no murmurs, rubs, or gallops  Respiratory:  clear to auscultation bilaterally, normal work of breathing GI: soft, nontender, nondistended, + BS Ext: without cyanosis, clubbing,  or edema, Good distal pulses bilaterally MS: no deformity or atrophy Skin: warm and dry, no rash Neuro:  Alert and Oriented x 3, Strength and sensation are intact Psych: euthymic mood, full affect  Wt Readings from Last 3 Encounters:  01/01/17 228 lb (103.4 kg)  12/30/16 227 lb 3.2 oz (103.1 kg)  12/23/16 228 lb 12.8 oz (103.8 kg)      Studies/Labs Reviewed:   EKG:  EKG is*** ordered today.  The ekg ordered today demonstrates ***  Recent Labs: 09/03/2016: ALT 35 09/09/2016: Hemoglobin 13.8 12/04/2016: BNP 57.6; Platelets 261 01/01/2017: BUN 14; Creatinine, Ser 1.34; NT-Pro BNP 180; Potassium 4.0; Sodium 147   Lipid Panel    Component Value Date/Time   CHOL 92 (L) 06/10/2016 1253   TRIG 91 06/10/2016 1253   HDL 35 (L) 06/10/2016 1253   CHOLHDL 2.6 06/10/2016 1253   VLDL 18 06/10/2016 1253   LDLCALC 39 06/10/2016 1253    Additional studies/ records that were reviewed today include:  Lexi scan 5/24/18Study Highlights     Nuclear stress EF: 44%.  There was no ST segment deviation noted during stress.  Defect 1: There is a small defect of severe severity present in the apical septal and apex location.  This is a low risk study.  The left ventricular ejection fraction is moderately decreased (30-44%).   Low risk stress nuclear study with small prior distal septal/apical infarct; no ischemia; EF 44 with global hypokinesis and mild LVE.     Low risk stress test with apical septal and apical defect secondary to ? Small prior  distal septal/apical infarct with no ischemia and EF 44%.  The defect is much worse on resting images and cath a year ago showed no significant disease.  Suspect this is artifact.  Please repeat a limited echo for wall motion to compare and Please find out if he has had any further CP         Lexi scan 3/2/17Study Highlights   Nuclear stress EF: 46%.  There was no ST segment deviation noted during stress.  No T wave inversion was noted during stress.  Defect 1: There is a small defect of mild severity present in the basal inferolateral location.  Findings consistent with ischemia.  This is a low risk study.  The left ventricular ejection fraction is mildly decreased (45-54%).   Low risk stress nuclear study with a small area of basal inferolateral very mild ischemia. Mildly reduced left ventricular global systolic function.     Cardiac cath 4/7/17Conclusion  1. Angiographically minimal CAD. Mild calcification noted extraluminal. 2. The left ventricular systolic function is normal. Normal LVEDP     Angiographically minimal coronary disease. Unlikely this is related to coronary ischemia.   Plan:  The patient was transferred to short stay holding area where she discharged following bed rest with TR band removal.  I have asked that he hold metformin until Monday, April 10.     He'll follow-up with [name] as scheduled           2-D echo 09/03/16 Study Conclusions   - Left ventricle: The cavity size was normal. Wall thickness was   increased in a pattern of mild LVH. Systolic function was mildly   reduced. The estimated ejection fraction was in the range of 45%   to 50%. Diffuse hypokinesis. Doppler parameters are consistent   with pseudonormal left ventricular relaxation (grade 2 diastolic   dysfunction). The E/e&' ratio is >15, suggesting elevated LV  filling pressure. - Left atrium: The atrium was at the upper limits of normal in   size. - Right atrium: The  atrium was mildly dilated. - Inferior vena cava: The vessel was normal in size. The   respirophasic diameter changes were in the normal range (>= 50%),   consistent with normal central venous pressure.   Impressions:   - Compared to a prior study in 2016, the LVEF is reduced to 45-50%   with global hypokinesis, grade 2 DD and elevated LV filling   pressure.     ASSESSMENT:    No diagnosis found.   PLAN:  In order of problems listed above:      Medication Adjustments/Labs and Tests Ordered: Current medicines are reviewed at length with the patient today.  Concerns regarding medicines are outlined above.  Medication changes, Labs and Tests ordered today are listed in the Patient Instructions below. There are no Patient Instructions on file for this visit.   Sumner Boast, [name]  01/08/2017 11:21 AM    [facility] [address], [place], [place]  [address] Phone: [phone]; Fax: [phone]

## 2017-01-08 NOTE — Progress Notes (Signed)
Subjective:  Patient ID: Marco [name], male    DOB: March 05, 1956  Age: 61 y.o. MRN: [id number]  CC: No chief complaint on file.   HPI Marco [name] has diabetes, hx of chronic chest pain with minimal CAD (based on cath 11/2015), gout, psoriasis, arthralgia (on chronic prednisone 10 mg daily), chronic pain, OSA  presents for   1. HTN and CHF: he was recently stated on aldactone 12.5 mg daily 2 weeks ago by his cardiologist. Taking lasix 40 mg daily. Taking losartan 100 mg daily. He reports feeling dizzy this afternoon about 30 minutes after taking his medication. He has not had much to eat today. He reports feeling dizzy intermittently over the past week.   2. Insomnia: he is taking lunesta with improvement of his sleep. He is non compliant with BiPAP citing dry mouth and choking cessation when he uses it. He has seen his pulmonologist who has addressed these concerns and stressed the importance of BiPAP use.   3. Trouble swallowing: he feels food and sometimes drinks get stuck in his mid chest. There is associated pain. This is a long standing problem.   Swallow study 03/22/2015:  Pt demonstrates a normal swallow function. No cricopharyngeal hypertrophy; UES compliance and patency are WNL. No oropharyngeal residuals. Pt did have appearance of slightly slow esophageal transit of solids, though this cleared easily with sips of thin liquids. The pt reported globus mid-sternum even when no residuals were present. Regular diet adn thin liquids with basic esophageal precautions are recommended. No SLP f/u needed  EGD with empiric esophageal dilation done on 04/19/2015.  EGD was normal   Social History  Substance Use Topics  . Smoking status: Former Smoker    Packs/day: 0.50    Years: 10.00    Types: Cigarettes    Quit date: 04/11/2008  . Smokeless tobacco: Never Used  . Alcohol use No    Outpatient Medications Prior to Visit  Medication Sig Dispense Refill  . ACCU-CHEK AVIVA PLUS test strip  USE ONE STRIP TO CHECK BLOOD GLUCOSE 3 TIMES DAILY 100 each 12  . ACCU-CHEK SOFTCLIX LANCETS lancets USE AS DIRECTED 100 each 11  . ACCU-CHEK SOFTCLIX LANCETS lancets 1 each by Other route 3 (three) times daily. Use as instructed 100 each 12  . albuterol (PROVENTIL HFA;VENTOLIN HFA) 108 (90 Base) MCG/ACT inhaler Inhale 2 puffs into the lungs every 6 (six) hours as needed for wheezing or shortness of breath. 1 Inhaler 2  . atorvastatin (LIPITOR) 40 MG tablet TAKE 1 TABLET BY MOUTH ONCE DAILY 90 tablet 0  . BD PEN NEEDLE NANO U/F 32G X 4 MM MISC USE ONE PEN NEEDLE THREE TIMES DAILY 100 each 11  . BIDIL 20-37.5 MG tablet TAKE 1 TABLET BY MOUTH THREE TIMES DAILY 270 tablet 0  . Blood Glucose Monitoring Suppl (ACCU-CHEK AVIVA PLUS) W/DEVICE KIT 1 Device by Does not apply route 3 (three) times daily after meals. E11.9 1 kit 0  . Blood Pressure KIT 1 each by Does not apply route 2 (two) times daily. 1 each 0  . carvedilol (COREG) 25 MG tablet TAKE 1 TABLET BY MOUTH TWICE DAILY 180 tablet 0  . EQ ASPIRIN ADULT LOW DOSE 81 MG EC tablet TAKE ONE TABLET BY MOUTH ONCE DAILY 90 tablet 0  . Eszopiclone 3 MG TABS Take 1 tablet (3 mg total) by mouth at bedtime. Take immediately before bedtime 30 tablet 2  . furosemide (LASIX) 40 MG tablet Take 1 tablet by mouth every morning  30 tablet 6  . glucose blood (ACCU-CHEK AVIVA PLUS) test strip 1 each by Other route 3 (three) times daily. E11.9 100 each 12  . HYDROcodone-acetaminophen (NORCO/VICODIN) 5-325 MG tablet Take 1 tablet by mouth every 8 (eight) hours as needed for moderate pain. Per pain management    . insulin aspart (NOVOLOG FLEXPEN) 100 UNIT/ML FlexPen Inject 14-24 Units into the skin 3 (three) times daily with meals. 15 mL 3  . Insulin Detemir (LEVEMIR FLEXTOUCH) 100 UNIT/ML Pen Inject 50 Units into the skin daily at 10 pm. 15 mL 3  . lidocaine (XYLOCAINE) 5 % ointment Apply 1 application topically 4 (four) times daily as needed. Apply to feet 35.44 g 2  .  losartan (COZAAR) 100 MG tablet Take 1 tablet (100 mg total) by mouth daily. 90 tablet 3  . metFORMIN (GLUCOPHAGE-XR) 500 MG 24 hr tablet TAKE TWO TABLETS BY MOUTH TWICE DAILY 360 tablet 0  . predniSONE (DELTASONE) 10 MG tablet Start 12/04/16 Increase prednisone to 40 mg by mouth  for 2 days, 30 mg for 2 days, 20 mg for 2 days then back to 10 mg daily 42 tablet 0  . pregabalin (LYRICA) 100 MG capsule Take 1 capsule (100 mg total) by mouth 3 (three) times daily. 90 capsule 5  . ranitidine (ZANTAC) 150 MG tablet TAKE 1 TABLET BY MOUTH AT BEDTIME 90 tablet 0  . Secukinumab (COSENTYX) 150 MG/ML SOSY Inject 150 mg into the skin every 28 (twenty-eight) days.     [name] spironolactone (ALDACTONE) 25 MG tablet Take 0.5 tablets (12.5 mg total) by mouth daily. 45 tablet 3  . vitamin B-12 (CYANOCOBALAMIN) 1000 MCG tablet Take 1 tablet (1,000 mcg total) by mouth daily. 90 tablet 3   No facility-administered medications prior to visit.     ROS Review of Systems  Constitutional: Positive for appetite change (early satiety ). Negative for chills, fatigue, fever and unexpected weight change.  HENT: Negative for facial swelling and hearing loss.        Dry mouth   Eyes: Negative for visual disturbance.  Respiratory: Positive for shortness of breath. Negative for cough.   Cardiovascular: Negative for chest pain, palpitations and leg swelling.  Gastrointestinal: Negative for abdominal distention, abdominal pain, anal bleeding, blood in stool, constipation, diarrhea, nausea, rectal pain and vomiting.  Endocrine: Negative for polydipsia, polyphagia and polyuria.  Genitourinary: Negative for dysuria, scrotal swelling and testicular pain.  Musculoskeletal: Negative for arthralgias, back pain, gait problem, joint swelling, myalgias and neck pain.  Skin: Negative for rash.  Allergic/Immunologic: Negative for immunocompromised state.  Neurological: Positive for dizziness and numbness (hands and feet ). Negative for  weakness and headaches.  Hematological: Negative for adenopathy. Does not bruise/bleed easily.  Psychiatric/Behavioral: Positive for dysphoric mood. Negative for sleep disturbance and suicidal ideas. The patient is not nervous/anxious.     Objective:  BP 92/64 Comment: manual  Pulse 72   Temp 98.3 F (36.8 C) (Oral)   Wt 224 lb 6.4 oz (101.8 kg)   SpO2 93%   BMI 36.22 kg/m   BP/Weight 01/08/2017 12/30/2016 9/56/2130  Systolic BP 92 865 784  Diastolic BP 64 72 70  Wt. (Lbs) 224.4 227.2 228.8  BMI 36.22 36.67 36.93   Wt Readings from Last 3 Encounters:  01/01/17 228 lb (103.4 kg)  12/30/16 227 lb 3.2 oz (103.1 kg)  12/23/16 228 lb 12.8 oz (103.8 kg)    Physical Exam  Constitutional: He appears well-developed and well-nourished. No distress.  HENT:  Head:  Normocephalic and atraumatic.  Mostly edentulous   Neck: Normal range of motion. Neck supple.  Cardiovascular: Normal rate, regular rhythm, normal heart sounds and intact distal pulses.   Pulmonary/Chest: Effort normal. No respiratory distress. He has no wheezes. He has no rales. He exhibits no tenderness.  Musculoskeletal: He exhibits no edema.  Neurological: He is alert.  Skin: Skin is warm and dry. No rash noted. No erythema.   Lab Results  Component Value Date   HGBA1C 6.9 12/04/2016   CBG 140  EKG: normal EKG, normal sinus rhythm. T wave inversions in inferior leads, but only 0.1 mV  He was treated with crackers, diet ginger ale na 1 L NS IV fluid bolus repeat BP 92/64, HR 72     Chemistry      Component Value Date/Time   NA 147 (H) 01/01/2017 1038   K 4.0 01/01/2017 1038   CL 99 01/01/2017 1038   CO2 26 01/01/2017 1038   BUN 14 01/01/2017 1038   CREATININE 1.34 (H) 01/01/2017 1038   CREATININE 1.42 (H) 09/09/2016 1037      Component Value Date/Time   CALCIUM 9.1 01/01/2017 1038   ALKPHOS 36 (L) 09/03/2016 0227   AST 24 09/03/2016 0227   ALT 35 09/03/2016 0227   BILITOT 0.7 09/03/2016 0227       Depression screen PHQ 2/9 11/21/2016 10/21/2016 09/23/2016  Decreased Interest 2 0 0  Down, Depressed, Hopeless _0 PHQ - 2 Score _1 Altered sleeping _2 Tired, decreased energy _3 Change in appetite (No Data) 0 0  Feeling bad or failure about yourself  0 0 0  Trouble concentrating 0 0 0  Moving slowly or fidgety/restless 0 0 0  Suicidal thoughts 0 0 0  PHQ-9 Score _4 Some recent data might be hidden   GAD 7 : Generalized Anxiety Score 11/21/2016 10/21/2016 09/23/2016 09/09/2016  Nervous, Anxious, on Edge 0 0 0 0  Control/stop worrying 0 1 2 0  Worry too much - different things 0 _5 Trouble relaxing 1 0 1 1  Restless 0 0 0 0  Easily annoyed or irritable 0 0 0 0  Afraid - awful might happen 0 0 0 0  Total GAD 7 Score _6 Assessment & Plan:  Diagnoses and all orders for this visit:  Diabetes mellitus with complication (HCC) -     POCT glucose (manual entry)  Primary insomnia -     Eszopiclone 3 MG TABS; Take 1 tablet (3 mg total) by mouth at bedtime. Take immediately before bedtime   There are no diagnoses linked to this encounter.  No orders of the defined types were placed in this encounter.   Follow-up: No Follow-up on file.   Boykin Nearing MD

## 2017-01-08 NOTE — Patient Instructions (Addendum)
Diagnoses and all orders for this visit:  Diabetes mellitus with complication ([place]) -     POCT glucose (manual entry)  Primary insomnia -     Eszopiclone 3 MG TABS; Take 1 tablet (3 mg total) by mouth at bedtime. Take immediately before bedtime  Hypotension due to drugs -     sodium chloride 0.9 % bolus 1,000 mL; Inject 1,000 mLs into the vein once.  Esophageal dysphagia -     Ambulatory referral to Gastroenterology -     DG Esophagus; Future  Please stop aldactone 12.5 mg due to low blood pressure  F/u in one week for BP check with pharmacisit   F/u with me in 4 weeks for HTN   Dr. Adrian Blackwater

## 2017-01-09 ENCOUNTER — Encounter: Payer: Self-pay | Admitting: Family Medicine

## 2017-01-09 DIAGNOSIS — R1310 Dysphagia, unspecified: Secondary | ICD-10-CM | POA: Insufficient documentation

## 2017-01-09 NOTE — Assessment & Plan Note (Signed)
This is a chronic problem Patient is s/p empiric dilation for subjective symptoms in 9.2016  Plan: Swallow study Referral back to GI Continue zantac

## 2017-01-09 NOTE — Assessment & Plan Note (Signed)
Hypotensive in office today without evidence of fluid overload. With hypernatremia suggesting volume depletion.  Plan: D.c aldactone 12.5 mg daily as this is the most recent medication  Continue lasix 40 mg daily Losartan 100 mg daily Coreg 25 mg BID bidil 20-37.5 TID

## 2017-01-09 NOTE — Assessment & Plan Note (Signed)
A; insomnia P: Continue lunesta Advised use of BiPAP nightly to avoid pulmonary HTN and R sided heart failure

## 2017-01-12 ENCOUNTER — Ambulatory Visit: Payer: Medicaid Other | Admitting: Physician Assistant

## 2017-01-12 ENCOUNTER — Other Ambulatory Visit: Payer: Medicaid Other

## 2017-01-13 ENCOUNTER — Telehealth: Payer: Self-pay | Admitting: Cardiology

## 2017-01-13 NOTE — Telephone Encounter (Signed)
New message     Pt is calling asking if the heart monitor he is wearing will continue to monitor his heart out of state. He is going to [place].

## 2017-01-15 ENCOUNTER — Telehealth: Payer: Self-pay | Admitting: *Deleted

## 2017-01-15 NOTE — Telephone Encounter (Signed)
Called to respond to call in question regarding if cardiac event monitor will work in [place], (patient going out of state).  Left message to patient that the [employer] monitor will work anywhere in the states providing there is cell phone coverage.  If in question of cell phone coverage, call [employer] directly, and they will be able to tell you if your recordings are transmitting.

## 2017-01-20 ENCOUNTER — Other Ambulatory Visit: Payer: Self-pay

## 2017-01-20 ENCOUNTER — Other Ambulatory Visit: Payer: Medicaid Other | Admitting: *Deleted

## 2017-01-20 ENCOUNTER — Ambulatory Visit (HOSPITAL_COMMUNITY): Payer: Medicaid Other | Attending: Cardiology

## 2017-01-20 ENCOUNTER — Encounter: Payer: Self-pay | Admitting: Physician Assistant

## 2017-01-20 DIAGNOSIS — R9439 Abnormal result of other cardiovascular function study: Secondary | ICD-10-CM

## 2017-01-20 DIAGNOSIS — I5042 Chronic combined systolic (congestive) and diastolic (congestive) heart failure: Secondary | ICD-10-CM | POA: Diagnosis not present

## 2017-01-20 DIAGNOSIS — I2510 Atherosclerotic heart disease of native coronary artery without angina pectoris: Secondary | ICD-10-CM

## 2017-01-20 DIAGNOSIS — I517 Cardiomegaly: Secondary | ICD-10-CM | POA: Diagnosis not present

## 2017-01-20 LAB — BASIC METABOLIC PANEL
BUN / CREAT RATIO: 16 (ref 10–24)
BUN: 26 mg/dL (ref 8–27)
CALCIUM: 9.8 mg/dL (ref 8.6–10.2)
CHLORIDE: 101 mmol/L (ref 96–106)
CO2: 24 mmol/L (ref 20–29)
Creatinine, Ser: 1.59 mg/dL — ABNORMAL HIGH (ref 0.76–1.27)
GFR calc Af Amer: 53 mL/min/{1.73_m2} — ABNORMAL LOW (ref >59)
GFR calc non Af Amer: 46 mL/min/{1.73_m2} — ABNORMAL LOW (ref >59)
GLUCOSE: 140 mg/dL — AB (ref 65–99)
POTASSIUM: 3.5 mmol/L (ref 3.5–5.2)
Sodium: 145 mmol/L — ABNORMAL HIGH (ref 134–144)

## 2017-01-20 NOTE — Progress Notes (Addendum)
Cardiology Office Note    Date:  01/21/2017  ID:  Marco [name], DOB 1956-05-21, MRN [id number] PCP:  Boykin Nearing, MD  Cardiologist:  [name]   Chief Complaint: f/u SOB/CP  History of Present Illness:  Marco [name] is a 61 y.o. male with history of DM, HTN, non-obstructive CAD by cath 2017, persistent atrial fibrillation (s/p RFCA in [place] 2017, Xarelto previousy d/c'd 2/2 anemia and unknown source of blood loss), chronic combined CHF (EF 45-50% by echo 08/2016), morbid obesity, OSA (incomplete compliance with CPAP due to intolerance), CKD III per labs, esophageal stricture, prior gastritis 2014, rheumatoid arthritis who presents for f/u of CHF.  LHC 11/2015 showed minimal CAD, mild calcification noted extraluminally, normal LVF, normal LVEDP. CPX 06/2016 with limited interpretation due to submaximal effort during exercise, difficult to interpret, also of note was very intolerant to discomfort from several processes during the CPX procedure today (i.e. Motion on bike, electrode removal, etc). 2D echo 09/03/16 showed EF 45-50% with global HK, grade 2 DD, elevated LV filling pressure. CTA 09/02/16 negative for PE. He saw [name] 12/23/16 with some complaints of exertional SOB/chest tightness with elevated BNP. Lasix was increased. Spironolactone was added and losartan was increased. F/u labs 01/01/2017 showed Cr 1.34 (baseline appears 1.5-1.6), Na 147, K 4.0, normalized BNP prompting decrease in Lasix. Subsequent BMET yesterday showde Cr 1.59, Na 145, K 3.5, Nuclear stress test was done for atypical CP 01/01/17 showing small defect of severe severity in apical septal and apex location, no ischemia, EF 44% - suspected artifact. Limited echo 01/20/17 showed EF 50-55%, hypokinesis of apical inferoseptal myocardium, grade 1 diastolic dysfunction, mild MR, no sig change from prior. Event monitor is in progress per [name].   He returns for follow-up of the above. He reports continued exertional SOB for the  past year. He did not notice any significant change with diuretic adjustment. No further chest pain. No significant LEE on exam today. He feels he gets SOB sometimes just talking. He no longer exercises due to dyspnea and is very sedentary. He did smoke for 20 years; quit in 2009. No wheezing. He feels fatigued the same way he did when he was anemic in the past. Initial BP 132/90, recheck by me 124/68. A few weeks ago had low reading with SBP in the 90s but none since. Weight stable.   Past Medical History:  Diagnosis Date  . Benign colon polyp 08/01/2013   [facility] in [place]. large base tranverse colon polyp was biopsied. polyp was benign with minimal surface hyperplastic change.  . Chronic combined systolic and diastolic CHF (congestive heart failure) ([place])   . Chronic pain   . CKD (chronic kidney disease), stage III   . Diabetes mellitus without complication ([place])   . Diverticulosis   . Esophageal hiatal hernia 07/29/2013   confirmed on EGD   . Esophageal stricture   . Essential hypertension   . Gastritis 07/29/2013   confirmed on EGD, bx done an negative for intestinal metaplasia, dsyplasia or H. pylori. normal gastric emptying study done 07/13/2013.  [name] GERD (gastroesophageal reflux disease)   . Morbid obesity ([place])   . Non-obstructive CAD    a. 02/2013 Cath ([place]): nonobs dzs;  b. 09/2014 Myoview ([place]): EF 55%, no ischemia;  c. 12/2014 Echo: EF 50-55%, gr1 DD, no effusion.  . Persistent atrial fibrillation ([place])    a. 02/2013 s/p rfca in [place], [place]-->prev on Xarelto, d/c'd 2/2 anemia, ? GIB.  [name] Rheumatoid arthritis (  [place])   . Sigmoid diverticulosis 08/01/2013   confirmed on colonscopy. record scanned into chart    Past Surgical History:  Procedure Laterality Date  . CARDIAC CATHETERIZATION  05/2014   ablation for atrial fibrillation  . CARDIAC CATHETERIZATION N/A 11/16/2015   Procedure: Left Heart Cath and Coronary Angiography;  Surgeon: Leonie Man, MD;   Location: [facility];  Service: Cardiovascular;  Laterality: N/A;  . COLON RESECTION  09/2013   due to large, abnormal polpy. non cancerous per patient.   . COLON SURGERY  09/2014   colon resection     Current Medications: Current Outpatient Prescriptions  Medication Sig Dispense Refill  . ACCU-CHEK AVIVA PLUS test strip USE ONE STRIP TO CHECK BLOOD GLUCOSE 3 TIMES DAILY 100 each 12  . ACCU-CHEK SOFTCLIX LANCETS lancets USE AS DIRECTED 100 each 11  . ACCU-CHEK SOFTCLIX LANCETS lancets 1 each by Other route 3 (three) times daily. Use as instructed 100 each 12  . albuterol (PROVENTIL HFA;VENTOLIN HFA) 108 (90 Base) MCG/ACT inhaler Inhale 2 puffs into the lungs every 6 (six) hours as needed for wheezing or shortness of breath. 1 Inhaler 2  . atorvastatin (LIPITOR) 40 MG tablet TAKE 1 TABLET BY MOUTH ONCE DAILY 90 tablet 0  . BD PEN NEEDLE NANO U/F 32G X 4 MM MISC USE ONE PEN NEEDLE THREE TIMES DAILY 100 each 11  . BIDIL 20-37.5 MG tablet TAKE 1 TABLET BY MOUTH THREE TIMES DAILY 270 tablet 0  . Blood Glucose Monitoring Suppl (ACCU-CHEK AVIVA PLUS) W/DEVICE KIT 1 Device by Does not apply route 3 (three) times daily after meals. E11.9 1 kit 0  . Blood Pressure KIT 1 each by Does not apply route 2 (two) times daily. 1 each 0  . carvedilol (COREG) 25 MG tablet TAKE 1 TABLET BY MOUTH TWICE DAILY 180 tablet 0  . EQ ASPIRIN ADULT LOW DOSE 81 MG EC tablet TAKE ONE TABLET BY MOUTH ONCE DAILY 90 tablet 0  . Eszopiclone 3 MG TABS Take 1 tablet (3 mg total) by mouth at bedtime. Take immediately before bedtime 30 tablet 5  . furosemide (LASIX) 40 MG tablet Take 1 tablet by mouth every morning 30 tablet 6  . glucose blood (ACCU-CHEK AVIVA PLUS) test strip 1 each by Other route 3 (three) times daily. E11.9 100 each 12  . HYDROcodone-acetaminophen (NORCO/VICODIN) 5-325 MG tablet Take 1 tablet by mouth every 8 (eight) hours as needed for moderate pain. Per pain management    . insulin aspart (NOVOLOG  FLEXPEN) 100 UNIT/ML FlexPen Inject 14-24 Units into the skin 3 (three) times daily with meals. 15 mL 3  . Insulin Detemir (LEVEMIR FLEXTOUCH) 100 UNIT/ML Pen Inject 50 Units into the skin daily at 10 pm. 15 mL 3  . lidocaine (XYLOCAINE) 5 % ointment Apply 1 application topically 4 (four) times daily as needed. Apply to feet 35.44 g 2  . losartan (COZAAR) 100 MG tablet Take 1 tablet (100 mg total) by mouth daily. 90 tablet 3  . metFORMIN (GLUCOPHAGE-XR) 500 MG 24 hr tablet TAKE TWO TABLETS BY MOUTH TWICE DAILY 360 tablet 0  . predniSONE (DELTASONE) 10 MG tablet Start 12/04/16 Increase prednisone to 40 mg by mouth  for 2 days, 30 mg for 2 days, 20 mg for 2 days then back to 10 mg daily 42 tablet 0  . pregabalin (LYRICA) 100 MG capsule Take 1 capsule (100 mg total) by mouth 3 (three) times daily. 90 capsule 5  . ranitidine (  ZANTAC) 150 MG tablet TAKE 1 TABLET BY MOUTH AT BEDTIME 90 tablet 0  . Secukinumab (COSENTYX) 150 MG/ML SOSY Inject 150 mg into the skin every 28 (twenty-eight) days.     . vitamin B-12 (CYANOCOBALAMIN) 1000 MCG tablet Take 1 tablet (1,000 mcg total) by mouth daily. 90 tablet 3   No current facility-administered medications for this visit.      Allergies:   Patient has no allergy information on record.   Social History   Social History  . Marital status: Married    Spouse name: Amethyst  . Number of children: 8  . Years of education: 75   Occupational History  .      disabled   Social History Main Topics  . Smoking status: Former Smoker    Packs/day: 0.50    Years: 10.00    Types: Cigarettes    Quit date: 04/11/2008  . Smokeless tobacco: Never Used  . Alcohol use No  . Drug use: No  . Sexual activity: Not Asked   Other Topics Concern  . None   Social History Narrative   Lives with wife. Does not work.  On disability.    Caffeine- coffee, 1/2 cup daily     Family History:  Family History  Problem Relation Age of Onset  . Hypertension Mother   .  Diabetes Mother   . Cancer Mother   . COPD Mother   . Hypertension Father   . Diabetes Father   . Heart Problems Father   . COPD Father     ROS:   Please see the history of present illness.  All other systems are reviewed and otherwise negative.    PHYSICAL EXAM:   VS:  BP 132/90   Pulse 71   Ht '5\' 6"'$  (1.676 m)   Wt 228 lb 6.4 oz (103.6 kg)   BMI 36.86 kg/m   BMI: Body mass index is 36.86 kg/m. GEN: Well nourished, well developed obese AAM, in no acute distress  HEENT: normocephalic, atraumatic Neck: no JVD, carotid bruits, or masses Cardiac: RRR; no murmurs, rubs, or gallops, no edema  Respiratory:  clear to auscultation bilaterally, normal work of breathing GI: soft, nontender, nondistended, + BS MS: no deformity or atrophy  Skin: warm and dry, no rash Neuro:  Alert and Oriented x 3, Strength and sensation are intact, follows commands Psych: euthymic mood, full affect  Wt Readings from Last 3 Encounters:  01/21/17 228 lb 6.4 oz (103.6 kg)  01/08/17 224 lb 6.4 oz (101.8 kg)  01/01/17 228 lb (103.4 kg)      Studies/Labs Reviewed:   EKG:  EKG was ordered today and personally reviewed by me and demonstrates NSR 72bpm with shortened PR 19m, LVH with inferior TWI and V3-V6. Similar to prior.  Recent Labs: 09/03/2016: ALT 35 12/04/2016: BNP 57.6; Hemoglobin 12.3; Platelets 261 01/01/2017: NT-Pro BNP 180 01/20/2017: BUN 26; Creatinine, Ser 1.59; Potassium 3.5; Sodium 145   Lipid Panel    Component Value Date/Time   CHOL 92 (L) 06/10/2016 1253   TRIG 91 06/10/2016 1253   HDL 35 (L) 06/10/2016 1253   CHOLHDL 2.6 06/10/2016 1253   VLDL 18 06/10/2016 1253   LDLCALC 39 06/10/2016 1253    Additional studies/ records that were reviewed today include: Summarized above.    ASSESSMENT & PLAN:   1. Shortness of breath/chest pain - chest pain improved. SOB continues to persist for the past year. Has has significant workup as above. Dr. TRadford Paxfelt recent  abnormal  nuclear study represented artifact as cath last year showed only minimal coronary artery disease. Echocardiogram yesterday was reported as similar to 08/2016. He appears euvolemic on exam. Sodium was marginally elevated on recent labs suggesting tendency towards dry side. He wonders whether or not his long history of smoking could be contributing. He had incomplete CPX in the past. PFTs 02/2016 showed "Conclusions: The reduced lung volumes, increased FEV1/FVC ratio and diffusion defect suggest an interstitial process such as fibrosis or interstitial inflammation." I feel it would be worthwhile for him to touch base with his pulmonologist to see if there is anything they would suggest as a trial to optimize his breathing. I also feel his deconditioning is playing a role. He is hesitant to join a gym because he feels like he won't be able to do any of the exercises. I think he would benefit from cardiac rehab - will refer. Lastly, given downtrending Hgb at PCP visit in 11/2016, will recheck CBC today to exclude progressive anemia. 2. Chronic combined CHF - appears euvolemic. Continue present regimen. Discussed importance of sodium intake reduction. 3. Essential HTN - recheck BP by me was normal - this is prior to him taking his meds today. 4. Persistent atrial fib - he will continue to wear event monitor until completion.  Disposition: F/u with [name] in 3 months. Addendum 01/22/2017: reviewed plan with [name], including recent limited echo, who is in agreement.  Medication Adjustments/Labs and Tests Ordered: Current medicines are reviewed at length with the patient today.  Concerns regarding medicines are outlined above. Medication changes, Labs and Tests ordered today are summarized above and listed in the Patient Instructions accessible in Encounters.   Signed, Charlie Pitter, [name]  01/21/2017 8:45 AM    [facility] [place], [place],   [address] Phone: [phone]; Fax: [phone]

## 2017-01-21 ENCOUNTER — Telehealth: Payer: Self-pay | Admitting: Pharmacist

## 2017-01-21 ENCOUNTER — Ambulatory Visit (INDEPENDENT_AMBULATORY_CARE_PROVIDER_SITE_OTHER): Payer: Medicaid Other | Admitting: Physician Assistant

## 2017-01-21 ENCOUNTER — Encounter: Payer: Self-pay | Admitting: Physician Assistant

## 2017-01-21 ENCOUNTER — Encounter: Payer: Medicaid Other | Admitting: Pharmacist

## 2017-01-21 VITALS — BP 124/68 | HR 71 | Ht 66.0 in | Wt 228.4 lb

## 2017-01-21 DIAGNOSIS — I5042 Chronic combined systolic (congestive) and diastolic (congestive) heart failure: Secondary | ICD-10-CM | POA: Diagnosis not present

## 2017-01-21 DIAGNOSIS — I481 Persistent atrial fibrillation: Secondary | ICD-10-CM | POA: Diagnosis not present

## 2017-01-21 DIAGNOSIS — I10 Essential (primary) hypertension: Secondary | ICD-10-CM

## 2017-01-21 DIAGNOSIS — R0789 Other chest pain: Secondary | ICD-10-CM | POA: Diagnosis not present

## 2017-01-21 DIAGNOSIS — R0602 Shortness of breath: Secondary | ICD-10-CM | POA: Diagnosis not present

## 2017-01-21 DIAGNOSIS — I4819 Other persistent atrial fibrillation: Secondary | ICD-10-CM

## 2017-01-21 NOTE — Telephone Encounter (Signed)
Called patient to tell him that he didn't need to come to appt as he had his blood pressure check at the cardiologist today and it was good. Patient verbalized understanding. I will cancel the appt and he will follow up with Dr. Adrian Blackwater as directed.

## 2017-01-21 NOTE — Patient Instructions (Signed)
Medication Instructions:  Your physician recommends that you continue on your current medications as directed. Please refer to the Current Medication list given to you today.   Labwork: Your physician recommends that you return for lab work today for CBC   Testing/Procedures: None Ordered   Follow-Up: Your physician recommends that you schedule a follow-up appointment in: 3 months with [name]  Any Other Special Instructions Will Be Listed Below (If Applicable).  HAVE A HAPPY BIRTHDAY!!!!!!!!!!!   If you need a refill on your cardiac medications before your next appointment, please call your pharmacy.  Thank you for choosing [facility]

## 2017-01-21 NOTE — Progress Notes (Deleted)
   S:    Patient arrives ***.    Presents to the clinic for hypertension evaluation. Patient was referred on ***.  Patient was last seen by Primary Care Provider on ***.   Patient {Actions; denies-reports:120008} adherence with medications.  Current BP Medications include:  Losartan 100 mg daily, carvedilol 25 mg BID, BiDil 20-37.5 mg TID, furosemide 40 mg daily.   Antihypertensives tried in the past include: Spironolactone was stopped at last visit due to hypotension.  Dietary habits include: avoids excessive use of salt  O:   Last 3 Office BP readings: BP Readings from Last 3 Encounters:  01/08/17 92/64  12/30/16 126/72  12/23/16 138/70    BMET    Component Value Date/Time   NA 145 (H) 01/20/2017 0835   K 3.5 01/20/2017 0835   CL 101 01/20/2017 0835   CO2 24 01/20/2017 0835   GLUCOSE 140 (H) 01/20/2017 0835   GLUCOSE 192 (H) 09/09/2016 1037   BUN 26 01/20/2017 0835   CREATININE 1.59 (H) 01/20/2017 0835   CREATININE 1.42 (H) 09/09/2016 1037   CALCIUM 9.8 01/20/2017 0835   GFRNONAA 46 (L) 01/20/2017 0835   GFRNONAA 53 (L) 09/09/2016 1037   GFRAA 53 (L) 01/20/2017 0835   GFRAA 61 09/09/2016 1037    A/P: Hypertension longstanding currently *** on current medications.  {Meds adjust:18428} ***.   Results reviewed and written information provided.   Total time in face-to-face counseling *** minutes.   F/U Clinic Visit with Dr. Adrian Blackwater in 3 weeks as directed.  Patient seen with Maple Mirza, PharmD Candidate and Mechele Claude, PharmD Candidate

## 2017-01-22 LAB — CBC
Hematocrit: 37.7 % (ref 37.5–51.0)
Hemoglobin: 12.2 g/dL — ABNORMAL LOW (ref 13.0–17.7)
MCH: 29.9 pg (ref 26.6–33.0)
MCHC: 32.4 g/dL (ref 31.5–35.7)
MCV: 92 fL (ref 79–97)
PLATELETS: 212 10*3/uL (ref 150–379)
RBC: 4.08 x10E6/uL — ABNORMAL LOW (ref 4.14–5.80)
RDW: 14.9 % (ref 12.3–15.4)
WBC: 9.1 10*3/uL (ref 3.4–10.8)

## 2017-01-23 ENCOUNTER — Telehealth (HOSPITAL_COMMUNITY): Payer: Self-pay

## 2017-01-23 ENCOUNTER — Telehealth: Payer: Self-pay | Admitting: Physician Assistant

## 2017-01-23 NOTE — Telephone Encounter (Signed)
Medicaid reimbursement form faxed to Dr. Theodosia Blender office on 01/23/17 for determination of eligibility to attend cardiac rehab.  Patient can't be scheduled for cardiac rehab until [name] completes and signs medicaid reimbursement form and office receives complete form.

## 2017-01-23 NOTE — Telephone Encounter (Signed)
New Message  Pt call requesting to peak with RN about getting Lab results. Please call back to discuss

## 2017-02-05 ENCOUNTER — Encounter: Payer: Self-pay | Admitting: Family Medicine

## 2017-02-05 ENCOUNTER — Ambulatory Visit: Payer: Medicaid Other | Attending: Family Medicine | Admitting: Family Medicine

## 2017-02-05 VITALS — BP 113/67 | HR 73 | Temp 98.7°F | Wt 230.2 lb

## 2017-02-05 DIAGNOSIS — I5042 Chronic combined systolic (congestive) and diastolic (congestive) heart failure: Secondary | ICD-10-CM | POA: Insufficient documentation

## 2017-02-05 DIAGNOSIS — G4733 Obstructive sleep apnea (adult) (pediatric): Secondary | ICD-10-CM | POA: Insufficient documentation

## 2017-02-05 DIAGNOSIS — Z794 Long term (current) use of insulin: Secondary | ICD-10-CM | POA: Diagnosis not present

## 2017-02-05 DIAGNOSIS — Z87891 Personal history of nicotine dependence: Secondary | ICD-10-CM | POA: Diagnosis not present

## 2017-02-05 DIAGNOSIS — R1319 Other dysphagia: Secondary | ICD-10-CM

## 2017-02-05 DIAGNOSIS — E1142 Type 2 diabetes mellitus with diabetic polyneuropathy: Secondary | ICD-10-CM | POA: Diagnosis not present

## 2017-02-05 DIAGNOSIS — Z7982 Long term (current) use of aspirin: Secondary | ICD-10-CM | POA: Insufficient documentation

## 2017-02-05 DIAGNOSIS — Z7952 Long term (current) use of systemic steroids: Secondary | ICD-10-CM | POA: Insufficient documentation

## 2017-02-05 DIAGNOSIS — R1310 Dysphagia, unspecified: Secondary | ICD-10-CM | POA: Insufficient documentation

## 2017-02-05 DIAGNOSIS — R079 Chest pain, unspecified: Secondary | ICD-10-CM | POA: Diagnosis not present

## 2017-02-05 DIAGNOSIS — Z79899 Other long term (current) drug therapy: Secondary | ICD-10-CM | POA: Diagnosis not present

## 2017-02-05 DIAGNOSIS — K279 Peptic ulcer, site unspecified, unspecified as acute or chronic, without hemorrhage or perforation: Secondary | ICD-10-CM | POA: Diagnosis not present

## 2017-02-05 DIAGNOSIS — R9439 Abnormal result of other cardiovascular function study: Secondary | ICD-10-CM | POA: Diagnosis not present

## 2017-02-05 DIAGNOSIS — F5101 Primary insomnia: Secondary | ICD-10-CM | POA: Insufficient documentation

## 2017-02-05 DIAGNOSIS — I2510 Atherosclerotic heart disease of native coronary artery without angina pectoris: Secondary | ICD-10-CM | POA: Diagnosis not present

## 2017-02-05 DIAGNOSIS — E118 Type 2 diabetes mellitus with unspecified complications: Secondary | ICD-10-CM | POA: Diagnosis not present

## 2017-02-05 DIAGNOSIS — M109 Gout, unspecified: Secondary | ICD-10-CM | POA: Insufficient documentation

## 2017-02-05 DIAGNOSIS — I110 Hypertensive heart disease with heart failure: Secondary | ICD-10-CM | POA: Diagnosis present

## 2017-02-05 DIAGNOSIS — I10 Essential (primary) hypertension: Secondary | ICD-10-CM | POA: Diagnosis not present

## 2017-02-05 DIAGNOSIS — E119 Type 2 diabetes mellitus without complications: Secondary | ICD-10-CM | POA: Insufficient documentation

## 2017-02-05 DIAGNOSIS — G8929 Other chronic pain: Secondary | ICD-10-CM | POA: Insufficient documentation

## 2017-02-05 LAB — GLUCOSE, POCT (MANUAL RESULT ENTRY): POC Glucose: 183 mg/dl — AB (ref 70–99)

## 2017-02-05 MED ORDER — PREDNISONE 10 MG PO TABS: 10.0000 mg | ORAL_TABLET | Freq: Every day | ORAL | 3 refills | Status: DC

## 2017-02-05 MED ORDER — INSULIN ASPART 100 UNIT/ML FLEXPEN: 14.0000 [IU] | PEN_INJECTOR | Freq: Three times a day (TID) | SUBCUTANEOUS | 5 refills | Status: DC

## 2017-02-05 MED ORDER — ESZOPICLONE 3 MG PO TABS: 3.0000 mg | ORAL_TABLET | Freq: Every day | ORAL | 5 refills | Status: DC

## 2017-02-05 MED ORDER — GLUCOSE BLOOD VI STRP: 1.0000 | ORAL_STRIP | Freq: Three times a day (TID) | 12 refills | Status: DC

## 2017-02-05 MED ORDER — ACCU-CHEK AVIVA PLUS W/DEVICE KIT: 1.0000 | PACK | Freq: Three times a day (TID) | 0 refills | Status: DC

## 2017-02-05 MED ORDER — ASPIRIN 81 MG PO TBEC: 81.0000 mg | DELAYED_RELEASE_TABLET | Freq: Every day | ORAL | 3 refills | Status: DC

## 2017-02-05 MED ORDER — CARVEDILOL 25 MG PO TABS: 25.0000 mg | ORAL_TABLET | Freq: Two times a day (BID) | ORAL | 3 refills | Status: DC

## 2017-02-05 MED ORDER — ISOSORB DINITRATE-HYDRALAZINE 20-37.5 MG PO TABS: 1.0000 | ORAL_TABLET | Freq: Three times a day (TID) | ORAL | 3 refills | Status: DC

## 2017-02-05 MED ORDER — METFORMIN HCL ER 500 MG PO TB24: 1000.0000 mg | ORAL_TABLET | Freq: Two times a day (BID) | ORAL | 3 refills | Status: DC

## 2017-02-05 MED ORDER — LOSARTAN POTASSIUM 100 MG PO TABS: 100.0000 mg | ORAL_TABLET | Freq: Every day | ORAL | 3 refills | Status: DC

## 2017-02-05 MED ORDER — RANITIDINE HCL 150 MG PO TABS: 150.0000 mg | ORAL_TABLET | Freq: Every day | ORAL | 3 refills | Status: DC

## 2017-02-05 MED ORDER — ATORVASTATIN CALCIUM 40 MG PO TABS: 40.0000 mg | ORAL_TABLET | Freq: Every day | ORAL | 3 refills | Status: DC

## 2017-02-05 MED ORDER — FUROSEMIDE 40 MG PO TABS: ORAL_TABLET | ORAL | 3 refills | Status: DC

## 2017-02-05 MED ORDER — PREGABALIN 100 MG PO CAPS: 100.0000 mg | ORAL_CAPSULE | Freq: Three times a day (TID) | ORAL | 5 refills | Status: DC

## 2017-02-05 MED ORDER — INSULIN DETEMIR 100 UNIT/ML FLEXPEN: 50.0000 [IU] | PEN_INJECTOR | Freq: Every day | SUBCUTANEOUS | 3 refills | Status: DC

## 2017-02-05 NOTE — Progress Notes (Addendum)
Subjective:  Patient ID: Marco [name], male    DOB: [birth year]  Age: 61 y.o. MRN: [id number]  CC: Hypertension   HPI Marco [name] has diabetes, hx of chronic chest pain with minimal CAD (based on cath 11/2015), gout, psoriasis, arthralgia (on chronic prednisone 10 mg daily), chronic pain, OSA  presents for   1. HTN and CHF: he is compliant with regimen. He denies dizziness and lightheadedness. When aldactone was stopped at the last visit it was not restarted. He is closely followed by cardiology.   2. Insomnia: he is taking lunesta with improvement of his sleep.    3. Trouble swallowing: he feels food and sometimes drinks get stuck in his mid chest. There is associated pain. This is a long standing problem. Symptoms are worsening.   Swallow study 03/22/2015:  Pt demonstrates a normal swallow function. No cricopharyngeal hypertrophy; UES compliance and patency are WNL. No oropharyngeal residuals. Pt did have appearance of slightly slow esophageal transit of solids, though this cleared easily with sips of thin liquids. The pt reported globus mid-sternum even when no residuals were present. Regular diet adn thin liquids with basic esophageal precautions are recommended. No SLP f/u needed  EGD with empiric esophageal dilation done on 04/19/2015.  EGD was normal  He has GI appointment next week.   Social History  Substance Use Topics  . Smoking status: Former Smoker    Packs/day: 0.50    Years: 10.00    Types: Cigarettes    Quit date: 04/11/2008  . Smokeless tobacco: Never Used  . Alcohol use No    Outpatient Medications Prior to Visit  Medication Sig Dispense Refill  . ACCU-CHEK AVIVA PLUS test strip USE ONE STRIP TO CHECK BLOOD GLUCOSE 3 TIMES DAILY 100 each 12  . ACCU-CHEK SOFTCLIX LANCETS lancets USE AS DIRECTED 100 each 11  . ACCU-CHEK SOFTCLIX LANCETS lancets 1 each by Other route 3 (three) times daily. Use as instructed 100 each 12  . albuterol (PROVENTIL HFA;VENTOLIN HFA) 108  (90 Base) MCG/ACT inhaler Inhale 2 puffs into the lungs every 6 (six) hours as needed for wheezing or shortness of breath. 1 Inhaler 2  . atorvastatin (LIPITOR) 40 MG tablet TAKE 1 TABLET BY MOUTH ONCE DAILY 90 tablet 0  . BD PEN NEEDLE NANO U/F 32G X 4 MM MISC USE ONE PEN NEEDLE THREE TIMES DAILY 100 each 11  . BIDIL 20-37.5 MG tablet TAKE 1 TABLET BY MOUTH THREE TIMES DAILY 270 tablet 0  . Blood Glucose Monitoring Suppl (ACCU-CHEK AVIVA PLUS) W/DEVICE KIT 1 Device by Does not apply route 3 (three) times daily after meals. E11.9 1 kit 0  . Blood Pressure KIT 1 each by Does not apply route 2 (two) times daily. 1 each 0  . carvedilol (COREG) 25 MG tablet TAKE 1 TABLET BY MOUTH TWICE DAILY 180 tablet 0  . EQ ASPIRIN ADULT LOW DOSE 81 MG EC tablet TAKE ONE TABLET BY MOUTH ONCE DAILY 90 tablet 0  . Eszopiclone 3 MG TABS Take 1 tablet (3 mg total) by mouth at bedtime. Take immediately before bedtime 30 tablet 5  . furosemide (LASIX) 40 MG tablet Take 1 tablet by mouth every morning 30 tablet 6  . glucose blood (ACCU-CHEK AVIVA PLUS) test strip 1 each by Other route 3 (three) times daily. E11.9 100 each 12  . HYDROcodone-acetaminophen (NORCO/VICODIN) 5-325 MG tablet Take 1 tablet by mouth every 8 (eight) hours as needed for moderate pain. Per pain management    .  insulin aspart (NOVOLOG FLEXPEN) 100 UNIT/ML FlexPen Inject 14-24 Units into the skin 3 (three) times daily with meals. 15 mL 3  . Insulin Detemir (LEVEMIR FLEXTOUCH) 100 UNIT/ML Pen Inject 50 Units into the skin daily at 10 pm. 15 mL 3  . lidocaine (XYLOCAINE) 5 % ointment Apply 1 application topically 4 (four) times daily as needed. Apply to feet 35.44 g 2  . losartan (COZAAR) 100 MG tablet Take 1 tablet (100 mg total) by mouth daily. 90 tablet 3  . metFORMIN (GLUCOPHAGE-XR) 500 MG 24 hr tablet TAKE TWO TABLETS BY MOUTH TWICE DAILY 360 tablet 0  . predniSONE (DELTASONE) 10 MG tablet Start 12/04/16 Increase prednisone to 40 mg by mouth  for 2  days, 30 mg for 2 days, 20 mg for 2 days then back to 10 mg daily 42 tablet 0  . pregabalin (LYRICA) 100 MG capsule Take 1 capsule (100 mg total) by mouth 3 (three) times daily. 90 capsule 5  . ranitidine (ZANTAC) 150 MG tablet TAKE 1 TABLET BY MOUTH AT BEDTIME 90 tablet 0  . Secukinumab (COSENTYX) 150 MG/ML SOSY Inject 150 mg into the skin every 28 (twenty-eight) days.     . vitamin B-12 (CYANOCOBALAMIN) 1000 MCG tablet Take 1 tablet (1,000 mcg total) by mouth daily. 90 tablet 3   No facility-administered medications prior to visit.     ROS Review of Systems  Constitutional: Positive for appetite change (early satiety ). Negative for chills, fatigue, fever and unexpected weight change.  HENT: Positive for trouble swallowing. Negative for facial swelling and hearing loss.        Dry mouth   Eyes: Negative for visual disturbance.  Respiratory: Positive for cough. Negative for shortness of breath.   Cardiovascular: Negative for chest pain, palpitations and leg swelling.  Gastrointestinal: Negative for abdominal distention, abdominal pain, anal bleeding, blood in stool, constipation, diarrhea, nausea, rectal pain and vomiting.  Endocrine: Negative for polydipsia, polyphagia and polyuria.  Genitourinary: Negative for dysuria, scrotal swelling and testicular pain.  Musculoskeletal: Negative for arthralgias, back pain, gait problem, joint swelling, myalgias and neck pain.  Skin: Negative for rash.  Allergic/Immunologic: Negative for immunocompromised state.  Neurological: Positive for numbness (hands and feet ). Negative for dizziness, weakness and headaches.  Hematological: Negative for adenopathy. Does not bruise/bleed easily.  Psychiatric/Behavioral: Positive for dysphoric mood. Negative for sleep disturbance and suicidal ideas. The patient is not nervous/anxious.     Objective:  BP 113/67   Pulse 73   Temp 98.7 F (37.1 C) (Oral)   Wt 230 lb 3.2 oz (104.4 kg)   SpO2 96%   BMI 37.16  kg/m   BP/Weight 02/05/2017 01/21/2017 6/83/4196  Systolic BP 222 979 92  Diastolic BP 67 68 64  Wt. (Lbs) 230.2 228.4 224.4  BMI 37.16 36.86 36.22   Wt Readings from Last 3 Encounters:  01/21/17 228 lb 6.4 oz (103.6 kg)  01/08/17 224 lb 6.4 oz (101.8 kg)  01/01/17 228 lb (103.4 kg)    Physical Exam  Constitutional: He appears well-developed and well-nourished. No distress.  HENT:  Head: Normocephalic and atraumatic.  Mostly edentulous   Neck: Normal range of motion. Neck supple.  Cardiovascular: Normal rate, regular rhythm, normal heart sounds and intact distal pulses.   Pulmonary/Chest: Effort normal. No respiratory distress. He has no wheezes. He has no rales. He exhibits no tenderness.  Musculoskeletal: He exhibits no edema.  Neurological: He is alert.  Skin: Skin is warm and dry. No rash noted. No  erythema.   Lab Results  Component Value Date   HGBA1C 6.9 12/04/2016   CBG 183     Chemistry      Component Value Date/Time   NA 145 (H) 01/20/2017 0835   K 3.5 01/20/2017 0835   CL 101 01/20/2017 0835   CO2 24 01/20/2017 0835   BUN 26 01/20/2017 0835   CREATININE 1.59 (H) 01/20/2017 0835   CREATININE 1.42 (H) 09/09/2016 1037      Component Value Date/Time   CALCIUM 9.8 01/20/2017 0835   ALKPHOS 36 (L) 09/03/2016 0227   AST 24 09/03/2016 0227   ALT 35 09/03/2016 0227   BILITOT 0.7 09/03/2016 0227      Depression screen PHQ 2/9 01/08/2017 11/21/2016 10/21/2016  Decreased Interest 0 2 0  Down, Depressed, Hopeless 0 1 1  PHQ - 2 Score 0 3 1  Altered sleeping '1 3 3  '$ Tired, decreased energy '1 1 2  '$ Change in appetite 0 (No Data) 0  Feeling bad or failure about yourself  0 0 0  Trouble concentrating 0 0 0  Moving slowly or fidgety/restless 0 0 0  Suicidal thoughts 0 0 0  PHQ-9 Score '2 7 6  '$ Some recent data might be hidden   GAD 7 : Generalized Anxiety Score 01/08/2017 11/21/2016 10/21/2016 09/23/2016  Nervous, Anxious, on Edge 0 0 0 0  Control/stop worrying 0 0 1  2  Worry too much - different things 0 0 1 3  Trouble relaxing 0 1 0 1  Restless 0 0 0 0  Easily annoyed or irritable 0 0 0 0  Afraid - awful might happen 0 0 0 0  Total GAD 7 Score 0 '1 2 6    '$ Assessment & Plan:  Holley was seen today for hypertension.  Diagnoses and all orders for this visit:  Type 2 diabetes mellitus without complication, without long-term current use of insulin (HCC) -     POCT glucose (manual entry) -     Blood Glucose Monitoring Suppl (ACCU-CHEK AVIVA PLUS) w/Device KIT; 1 Device by Does not apply route 3 (three) times daily after meals. E11.9 -     glucose blood (ACCU-CHEK AVIVA PLUS) test strip; 1 each by Other route 3 (three) times daily. E11.9  Essential hypertension -     isosorbide-hydrALAZINE (BIDIL) 20-37.5 MG tablet; Take 1 tablet by mouth 3 (three) times daily. -     carvedilol (COREG) 25 MG tablet; Take 1 tablet (25 mg total) by mouth 2 (two) times daily. -     losartan (COZAAR) 100 MG tablet; Take 1 tablet (100 mg total) by mouth daily.  Esophageal dysphagia  Primary insomnia -     Eszopiclone 3 MG TABS; Take 1 tablet (3 mg total) by mouth at bedtime. Take immediately before bedtime  Chronic combined systolic and diastolic CHF (congestive heart failure) (HCC) -     furosemide (LASIX) 40 MG tablet; Take 1 tablet by mouth every morning  Coronary artery disease involving native coronary artery of native heart without angina pectoris -     atorvastatin (LIPITOR) 40 MG tablet; Take 1 tablet (40 mg total) by mouth daily. -     aspirin (EQ ASPIRIN ADULT LOW DOSE) 81 MG EC tablet; Take 1 tablet (81 mg total) by mouth daily. Swallow whole. -     furosemide (LASIX) 40 MG tablet; Take 1 tablet by mouth every morning  Abnormal finding on cardiovascular stress test -     furosemide (LASIX) 40 MG  tablet; Take 1 tablet by mouth every morning  Chest pain, unspecified type -     predniSONE (DELTASONE) 10 MG tablet; Take 1 tablet (10 mg total) by mouth daily  with breakfast.  Type 2 diabetes mellitus without complication, with long-term current use of insulin (HCC) -     losartan (COZAAR) 100 MG tablet; Take 1 tablet (100 mg total) by mouth daily. -     metFORMIN (GLUCOPHAGE-XR) 500 MG 24 hr tablet; Take 2 tablets (1,000 mg total) by mouth 2 (two) times daily. -     insulin aspart (NOVOLOG FLEXPEN) 100 UNIT/ML FlexPen; Inject 14-24 Units into the skin 3 (three) times daily with meals.  Diabetes mellitus with complication (HCC) -     Insulin Detemir (LEVEMIR FLEXTOUCH) 100 UNIT/ML Pen; Inject 50 Units into the skin daily at 10 pm.  Diabetic polyneuropathy associated with type 2 diabetes mellitus (HCC) -     pregabalin (LYRICA) 100 MG capsule; Take 1 capsule (100 mg total) by mouth 3 (three) times daily.  PUD (peptic ulcer disease) -     ranitidine (ZANTAC) 150 MG tablet; Take 1 tablet (150 mg total) by mouth at bedtime.   There are no diagnoses linked to this encounter.  No orders of the defined types were placed in this encounter.   Follow-up: Return in about 3 months (around 05/08/2017) for HTN and diabetes .   Boykin Nearing MD

## 2017-02-05 NOTE — Assessment & Plan Note (Signed)
Well controlled No hypotension No evidence of dehydration or fluid overload Continue current regimen

## 2017-02-05 NOTE — Assessment & Plan Note (Signed)
Patient to follow up with GI

## 2017-02-05 NOTE — Patient Instructions (Addendum)
Addis was seen today for hypertension.  Diagnoses and all orders for this visit:  Type 2 diabetes mellitus without complication, without long-term current use of insulin (HCC) -     POCT glucose (manual entry) -     Blood Glucose Monitoring Suppl (ACCU-CHEK AVIVA PLUS) w/Device KIT; 1 Device by Does not apply route 3 (three) times daily after meals. E11.9 -     glucose blood (ACCU-CHEK AVIVA PLUS) test strip; 1 each by Other route 3 (three) times daily. E11.9  Essential hypertension -     isosorbide-hydrALAZINE (BIDIL) 20-37.5 MG tablet; Take 1 tablet by mouth 3 (three) times daily. -     carvedilol (COREG) 25 MG tablet; Take 1 tablet (25 mg total) by mouth 2 (two) times daily. -     losartan (COZAAR) 100 MG tablet; Take 1 tablet (100 mg total) by mouth daily.  Esophageal dysphagia  Primary insomnia -     Eszopiclone 3 MG TABS; Take 1 tablet (3 mg total) by mouth at bedtime. Take immediately before bedtime  Chronic combined systolic and diastolic CHF (congestive heart failure) (HCC) -     furosemide (LASIX) 40 MG tablet; Take 1 tablet by mouth every morning  Coronary artery disease involving native coronary artery of native heart without angina pectoris -     atorvastatin (LIPITOR) 40 MG tablet; Take 1 tablet (40 mg total) by mouth daily. -     aspirin (EQ ASPIRIN ADULT LOW DOSE) 81 MG EC tablet; Take 1 tablet (81 mg total) by mouth daily. Swallow whole. -     furosemide (LASIX) 40 MG tablet; Take 1 tablet by mouth every morning  Abnormal finding on cardiovascular stress test -     furosemide (LASIX) 40 MG tablet; Take 1 tablet by mouth every morning  Chest pain, unspecified type -     predniSONE (DELTASONE) 10 MG tablet; Take 1 tablet (10 mg total) by mouth daily with breakfast.  Type 2 diabetes mellitus without complication, with long-term current use of insulin (HCC) -     losartan (COZAAR) 100 MG tablet; Take 1 tablet (100 mg total) by mouth daily. -     metFORMIN  (GLUCOPHAGE-XR) 500 MG 24 hr tablet; Take 2 tablets (1,000 mg total) by mouth 2 (two) times daily. -     insulin aspart (NOVOLOG FLEXPEN) 100 UNIT/ML FlexPen; Inject 14-24 Units into the skin 3 (three) times daily with meals.  Diabetes mellitus with complication (HCC) -     Insulin Detemir (LEVEMIR FLEXTOUCH) 100 UNIT/ML Pen; Inject 50 Units into the skin daily at 10 pm.  Diabetic polyneuropathy associated with type 2 diabetes mellitus (HCC) -     pregabalin (LYRICA) 100 MG capsule; Take 1 capsule (100 mg total) by mouth 3 (three) times daily.  PUD (peptic ulcer disease) -     ranitidine (ZANTAC) 150 MG tablet; Take 1 tablet (150 mg total) by mouth at bedtime.   Keep GI follow up appointment  Dr. Adrian Blackwater

## 2017-02-09 ENCOUNTER — Ambulatory Visit: Payer: Medicaid Other | Admitting: Internal Medicine

## 2017-02-10 ENCOUNTER — Telehealth: Payer: Self-pay

## 2017-02-10 DIAGNOSIS — I5042 Chronic combined systolic (congestive) and diastolic (congestive) heart failure: Secondary | ICD-10-CM

## 2017-02-10 NOTE — Telephone Encounter (Signed)
Reviewed GXT instructions with patient and test ordered. GXT scheduled 7/5. Patient agrees with treatment plan.

## 2017-02-10 NOTE — Telephone Encounter (Signed)
Per [name], patient will need to complete GXT to determine mets to qualify for Medicaid reimbursement for participation in Cardiac Rehab.   Left message to call back to arrange GXT.

## 2017-02-12 ENCOUNTER — Ambulatory Visit (INDEPENDENT_AMBULATORY_CARE_PROVIDER_SITE_OTHER): Payer: Medicaid Other

## 2017-02-12 DIAGNOSIS — I5042 Chronic combined systolic (congestive) and diastolic (congestive) heart failure: Secondary | ICD-10-CM | POA: Diagnosis not present

## 2017-02-12 LAB — EXERCISE TOLERANCE TEST
CHL CUP MPHR: 158 {beats}/min
CHL CUP STRESS STAGE 1 DBP: 82 mmHg
CHL CUP STRESS STAGE 1 HR: 77 {beats}/min
CHL CUP STRESS STAGE 1 SBP: 139 mmHg
CHL CUP STRESS STAGE 1 SPEED: 0 mph
CHL CUP STRESS STAGE 2 HR: 77 {beats}/min
CHL CUP STRESS STAGE 3 GRADE: 0 %
CHL CUP STRESS STAGE 3 SPEED: 1 mph
CHL CUP STRESS STAGE 4 GRADE: 0.1 %
CHL CUP STRESS STAGE 4 SPEED: 1 mph
CHL CUP STRESS STAGE 5 HR: 105 {beats}/min
CHL CUP STRESS STAGE 5 SPEED: 1.7 mph
CHL CUP STRESS STAGE 6 DBP: 78 mmHg
CHL CUP STRESS STAGE 6 SBP: 126 mmHg
CHL CUP STRESS STAGE 6 SPEED: 0 mph
CHL CUP STRESS STAGE 7 GRADE: 0 %
CHL CUP STRESS STAGE 7 SPEED: 0 mph
CSEPPHR: 105 {beats}/min
Estimated workload: 1.8 METS
Exercise duration (sec): 20 s
Percent HR: 69 %
Percent of predicted max HR: 66 %
RPE: 14
Rest HR: 75 {beats}/min
Stage 1 Grade: 0 %
Stage 2 Grade: 0 %
Stage 2 Speed: 0 mph
Stage 3 HR: 83 {beats}/min
Stage 4 HR: 83 {beats}/min
Stage 5 Grade: 10 %
Stage 6 Grade: 0 %
Stage 6 HR: 88 {beats}/min
Stage 7 DBP: 78 mmHg
Stage 7 HR: 76 {beats}/min
Stage 7 SBP: 141 mmHg

## 2017-02-16 ENCOUNTER — Telehealth: Payer: Self-pay | Admitting: Internal Medicine

## 2017-02-16 NOTE — Progress Notes (Signed)
FYI  Thanks,  [employer]

## 2017-02-16 NOTE — Telephone Encounter (Signed)
Pt states he is having problems swallowing and is choking on some of the food. Pt requests to be seen asap. Pt scheduled to see Ellouise Newer PA tomorrow at 2:15pm. Pt aware of appt.

## 2017-02-17 ENCOUNTER — Encounter: Payer: Self-pay | Admitting: Physician Assistant

## 2017-02-17 ENCOUNTER — Ambulatory Visit (INDEPENDENT_AMBULATORY_CARE_PROVIDER_SITE_OTHER): Payer: Medicaid Other | Admitting: Physician Assistant

## 2017-02-17 ENCOUNTER — Telehealth: Payer: Self-pay | Admitting: *Deleted

## 2017-02-17 VITALS — BP 102/60 | HR 77 | Ht 66.0 in | Wt 224.5 lb

## 2017-02-17 DIAGNOSIS — R1319 Other dysphagia: Secondary | ICD-10-CM | POA: Diagnosis not present

## 2017-02-17 NOTE — Patient Instructions (Signed)
Stop the Zantac. We have provided you with Dysphagia Diet.   You will be called with the appointment for the Modified Barium Swallow.   If you are age 62 or younger, your body mass index should be between 19-25. Your Body mass index is 36.24 kg/m. If this is out of the aformentioned range listed, please consider follow up with your Primary Care Provider.

## 2017-02-17 NOTE — Progress Notes (Signed)
Chief Complaint: Dysphagia  HPI:   Marco [name] is a 61 year old African-American male with multiple significant medical problems as listed below, who presents to clinic today with a complaint of dysphagia.   Patient regularly follows with Dr. Henrene Pastor and was last seen in clinic 06/03/16 for a complaint of bloating and fullness. It was noted that he had recent EGD with dilation in September 2016 and colonoscopy at that time as well. At that time, he had a gastric emptying study which returned normal. He was told to continue PPI and maintain good control of his diabetes.   Today, the patient returns to clinic and tells me that he has had increasing dysphagia ever since his last empiric dilation in September 2016. The patient explains that now this is occurring even with liquids. He tells me that he can just drink a sip of coffee or juice in the morning and he will feel as though it stops in his throat and he has to regurgitate it. Sometimes he is completely fine and other times he will have the symptoms throughout the day. This also occurs with his pills and food. This is becoming more frequent. Patient describes that during these episodes he develops a lot of esophageal pain which then requires use of pain pills to help. Patient is worried that at some point he will not be able to regurgitate the food and/or something will go down his "windpipe". Patient tells me he has had a long history of dysphagia which "they have never been able to diagnose, even while I was in [place]".   Patient denies fever, chills, blood in the stool, melena, weight loss, anorexia, change in bowel habits, nausea, vomiting or symptoms that awaken him at night.  Past Medical History:  Diagnosis Date  . Benign colon polyp 08/01/2013   [facility] in [place]. large base tranverse colon polyp was biopsied. polyp was benign with minimal surface hyperplastic change.  . Chronic combined systolic and diastolic CHF  (congestive heart failure) ([place])   . Chronic pain   . CKD (chronic kidney disease), stage III   . Diabetes mellitus without complication ([place])   . Diverticulosis   . Esophageal hiatal hernia 07/29/2013   confirmed on EGD   . Esophageal stricture   . Essential hypertension   . Gastritis 07/29/2013   confirmed on EGD, bx done an negative for intestinal metaplasia, dsyplasia or H. pylori. normal gastric emptying study done 07/13/2013.  [name] GERD (gastroesophageal reflux disease)   . Morbid obesity ([place])   . Non-obstructive CAD    a. 02/2013 Cath ([place]): nonobs dzs;  b. 09/2014 Myoview ([place]): EF 55%, no ischemia;  c. 12/2014 Echo: EF 50-55%, gr1 DD, no effusion.  . Persistent atrial fibrillation ([place])    a. 02/2013 s/p rfca in [place], [place]-->prev on Xarelto, d/c'd 2/2 anemia, ? GIB.  [name] Rheumatoid arthritis ([place])   . Sigmoid diverticulosis 08/01/2013   confirmed on colonscopy. record scanned into chart    Past Surgical History:  Procedure Laterality Date  . CARDIAC CATHETERIZATION  05/2014   ablation for atrial fibrillation  . CARDIAC CATHETERIZATION N/A 11/16/2015   Procedure: Left Heart Cath and Coronary Angiography;  Surgeon: Leonie Man, MD;  Location: [facility];  Service: Cardiovascular;  Laterality: N/A;  . COLON RESECTION  09/2013   due to large, abnormal polpy. non cancerous per patient.   . COLON SURGERY  09/2014   colon resection     Current Outpatient  Prescriptions  Medication Sig Dispense Refill  . ACCU-CHEK AVIVA PLUS test strip USE ONE STRIP TO CHECK BLOOD GLUCOSE 3 TIMES DAILY 100 each 12  . ACCU-CHEK SOFTCLIX LANCETS lancets USE AS DIRECTED 100 each 11  . ACCU-CHEK SOFTCLIX LANCETS lancets 1 each by Other route 3 (three) times daily. Use as instructed 100 each 12  . albuterol (PROVENTIL HFA;VENTOLIN HFA) 108 (90 Base) MCG/ACT inhaler Inhale 2 puffs into the lungs every 6 (six) hours as needed for wheezing or shortness of breath. 1 Inhaler 2  . aspirin (EQ ASPIRIN  ADULT LOW DOSE) 81 MG EC tablet Take 1 tablet (81 mg total) by mouth daily. Swallow whole. 90 tablet 3  . atorvastatin (LIPITOR) 40 MG tablet Take 1 tablet (40 mg total) by mouth daily. 90 tablet 3  . BD PEN NEEDLE NANO U/F 32G X 4 MM MISC USE ONE PEN NEEDLE THREE TIMES DAILY 100 each 11  . Blood Glucose Monitoring Suppl (ACCU-CHEK AVIVA PLUS) w/Device KIT 1 Device by Does not apply route 3 (three) times daily after meals. E11.9 1 kit 0  . Blood Pressure KIT 1 each by Does not apply route 2 (two) times daily. 1 each 0  . carvedilol (COREG) 25 MG tablet Take 1 tablet (25 mg total) by mouth 2 (two) times daily. 180 tablet 3  . Eszopiclone 3 MG TABS Take 1 tablet (3 mg total) by mouth at bedtime. Take immediately before bedtime 30 tablet 5  . furosemide (LASIX) 40 MG tablet Take 1 tablet by mouth every morning 90 tablet 3  . glucose blood (ACCU-CHEK AVIVA PLUS) test strip 1 each by Other route 3 (three) times daily. E11.9 100 each 12  . HYDROcodone-acetaminophen (NORCO/VICODIN) 5-325 MG tablet Take 1 tablet by mouth every 8 (eight) hours as needed for moderate pain. Per pain management    . insulin aspart (NOVOLOG FLEXPEN) 100 UNIT/ML FlexPen Inject 14-24 Units into the skin 3 (three) times daily with meals. 15 mL 5  . Insulin Detemir (LEVEMIR FLEXTOUCH) 100 UNIT/ML Pen Inject 50 Units into the skin daily at 10 pm. 45 mL 3  . isosorbide-hydrALAZINE (BIDIL) 20-37.5 MG tablet Take 1 tablet by mouth 3 (three) times daily. 270 tablet 3  . lidocaine (XYLOCAINE) 5 % ointment Apply 1 application topically 4 (four) times daily as needed. Apply to feet 35.44 g 2  . losartan (COZAAR) 100 MG tablet Take 1 tablet (100 mg total) by mouth daily. 90 tablet 3  . metFORMIN (GLUCOPHAGE-XR) 500 MG 24 hr tablet Take 2 tablets (1,000 mg total) by mouth 2 (two) times daily. 360 tablet 3  . predniSONE (DELTASONE) 10 MG tablet Take 1 tablet (10 mg total) by mouth daily with breakfast. 90 tablet 3  . pregabalin (LYRICA) 100 MG  capsule Take 1 capsule (100 mg total) by mouth 3 (three) times daily. 90 capsule 5  . ranitidine (ZANTAC) 150 MG tablet Take 1 tablet (150 mg total) by mouth at bedtime. 90 tablet 3  . Secukinumab (COSENTYX) 150 MG/ML SOSY Inject 150 mg into the skin every 28 (twenty-eight) days.     . vitamin B-12 (CYANOCOBALAMIN) 1000 MCG tablet Take 1 tablet (1,000 mcg total) by mouth daily. 90 tablet 3   No current facility-administered medications for this visit.     Allergies as of 02/17/2017  . (Not on File)    Family History  Problem Relation Age of Onset  . Hypertension Mother   . Diabetes Mother   . COPD Mother   .  Lung cancer Mother        Smoker   . Hypertension Father   . Diabetes Father   . Heart Problems Father   . COPD Father   . Colon cancer Neg Hx   . Stomach cancer Neg Hx     Social History   Social History  . Marital status: Married    Spouse name: Amethyst  . Number of children: 8  . Years of education: 88   Occupational History  .      disabled   Social History Main Topics  . Smoking status: Former Smoker    Packs/day: 0.50    Years: 10.00    Types: Cigarettes    Quit date: 04/11/2008  . Smokeless tobacco: Never Used  . Alcohol use No  . Drug use: No  . Sexual activity: Yes    Partners: Female    Birth control/ protection: None   Other Topics Concern  . Not on file   Social History Narrative   Lives with wife. Does not work.  On disability.    Caffeine- coffee, 1/2 cup daily    Review of Systems:    Constitutional: No weight loss, fever or chills Skin: No rash  Cardiovascular: No chest pain Respiratory: No SOB  Gastrointestinal: See HPI and otherwise negative   Physical Exam:  Vital signs: BP 102/60   Pulse 77   Ht '5\' 6"'$  (1.676 m)   Wt 224 lb 8 oz (101.8 kg)   BMI 36.24 kg/m   Constitutional:   Pleasant African American male appears to be in NAD, Well developed, Well nourished, alert and cooperative Respiratory: Respirations even and  unlabored. Lungs clear to auscultation bilaterally.   No wheezes, crackles, or rhonchi.  Cardiovascular: Normal S1, S2. No MRG. Regular rate and rhythm. No peripheral edema, cyanosis or pallor.  Gastrointestinal:  Soft, nondistended, nontender. No rebound or guarding. Normal bowel sounds. No appreciable masses or hepatomegaly. Psychiatric:  Demonstrates good judgement and reason without abnormal affect or behaviors.  MOST RECENT LABS AND IMAGING: CBC    Component Value Date/Time   WBC 9.1 01/21/2017 0921   WBC 10.0 09/09/2016 1037   RBC 4.08 (L) 01/21/2017 0921   RBC 4.85 09/09/2016 1037   HGB 12.2 (L) 01/21/2017 0921   HCT 37.7 01/21/2017 0921   PLT 212 01/21/2017 0921   MCV 92 01/21/2017 0921   MCH 29.9 01/21/2017 0921   MCH 28.5 09/09/2016 1037   MCHC 32.4 01/21/2017 0921   MCHC 31.7 (L) 09/09/2016 1037   RDW 14.9 01/21/2017 0921   LYMPHSABS 2.4 09/05/2016 0336   MONOABS 2.0 (H) 09/05/2016 0336   EOSABS 0.0 09/05/2016 0336   BASOSABS 0.0 09/05/2016 0336    CMP     Component Value Date/Time   NA 145 (H) 01/20/2017 0835   K 3.5 01/20/2017 0835   CL 101 01/20/2017 0835   CO2 24 01/20/2017 0835   GLUCOSE 140 (H) 01/20/2017 0835   GLUCOSE 192 (H) 09/09/2016 1037   BUN 26 01/20/2017 0835   CREATININE 1.59 (H) 01/20/2017 0835   CREATININE 1.42 (H) 09/09/2016 1037   CALCIUM 9.8 01/20/2017 0835   PROT 6.4 (L) 09/03/2016 0227   ALBUMIN 3.1 (L) 09/03/2016 0227   AST 24 09/03/2016 0227   ALT 35 09/03/2016 0227   ALKPHOS 36 (L) 09/03/2016 0227   BILITOT 0.7 09/03/2016 0227   GFRNONAA 46 (L) 01/20/2017 0835   GFRNONAA 53 (L) 09/09/2016 1037   GFRAA 53 (L) 01/20/2017 3524  GFRAA 61 09/09/2016 1037    Assessment: 1. Dysphagia: Long history of dysphagia, worsening now with liquids as well, intermittent, no relief after EGD with empiric dilation in 2016 per the patient, barium swallow before that EGD was normal; consider esophageal motility disorder versus esophageal spasm  versus other  Plan: 1. Scheduled patient for a modified barium swallow with speech pathology as well as barium esophagram. If it does not appear patient's symptoms are structurally related, would likely recommend an esophageal manometry for further evaluation of possible esophageal spasm vs other. 2. Reviewed anti-dysphagia measures including taking small bites, chewing well, avoiding distraction while eating and the chin tuck technique. 3. Patient to follow in clinic per recommendations after time of swallow study above  Ellouise Newer, [name] [facility] 02/17/2017, 2:49 PM  Cc: Boykin Nearing, MD

## 2017-02-17 NOTE — Telephone Encounter (Signed)
Advised patient of his appointment for 02-26-2017 at 1:00 PM.  He is to arrive at 12:45 PM.  He has not dietary restrictions.  Location is [facility] Radiology, 1st floor.  The date was good for the patient.  Tests ordered:  Modified Barium Swallow with Speech Pathology, Barium Esophagram to follow Ellouise Newer [name].

## 2017-02-17 NOTE — Progress Notes (Signed)
Assessment and plans reviewed  

## 2017-02-18 ENCOUNTER — Other Ambulatory Visit (HOSPITAL_COMMUNITY): Payer: Self-pay | Admitting: Physician Assistant

## 2017-02-18 DIAGNOSIS — R1319 Other dysphagia: Secondary | ICD-10-CM

## 2017-02-25 ENCOUNTER — Other Ambulatory Visit: Payer: Self-pay

## 2017-02-25 DIAGNOSIS — I5042 Chronic combined systolic (congestive) and diastolic (congestive) heart failure: Secondary | ICD-10-CM

## 2017-02-26 ENCOUNTER — Ambulatory Visit (HOSPITAL_COMMUNITY)
Admission: RE | Admit: 2017-02-26 | Discharge: 2017-02-26 | Disposition: A | Discharge status: Home / Self Care | Payer: Medicaid Other | Source: Ambulatory Visit | Attending: Physician Assistant | Admitting: Physician Assistant

## 2017-02-26 DIAGNOSIS — R1319 Other dysphagia: Secondary | ICD-10-CM | POA: Diagnosis not present

## 2017-02-27 ENCOUNTER — Telehealth (HOSPITAL_COMMUNITY): Payer: Self-pay

## 2017-02-27 NOTE — Telephone Encounter (Signed)
Patient insurance is active and benefits verified. Patient has Medicaid - no co-payment, no deductible, no out of pocket, no co-insurance, no pre-authorization and no limit on visit. Passport/reference [phone].

## 2017-03-02 ENCOUNTER — Ambulatory Visit: Payer: Medicaid Other

## 2017-03-05 ENCOUNTER — Other Ambulatory Visit: Payer: Self-pay

## 2017-03-05 ENCOUNTER — Encounter (HOSPITAL_COMMUNITY): Payer: Self-pay | Admitting: Emergency Medicine

## 2017-03-05 ENCOUNTER — Ambulatory Visit: Payer: Medicaid Other | Attending: Family Medicine | Admitting: Physician Assistant

## 2017-03-05 ENCOUNTER — Encounter: Payer: Self-pay | Admitting: Physician Assistant

## 2017-03-05 ENCOUNTER — Emergency Department (HOSPITAL_COMMUNITY): Payer: Medicaid Other

## 2017-03-05 ENCOUNTER — Emergency Department (HOSPITAL_COMMUNITY)
Admission: EM | Admit: 2017-03-05 | Discharge: 2017-03-05 | Disposition: A | Discharge status: Home / Self Care | Payer: Medicaid Other | Attending: Emergency Medicine | Admitting: Emergency Medicine

## 2017-03-05 VITALS — BP 90/60 | HR 80 | Resp 15 | Ht 64.0 in | Wt 229.8 lb

## 2017-03-05 DIAGNOSIS — Z794 Long term (current) use of insulin: Secondary | ICD-10-CM | POA: Diagnosis not present

## 2017-03-05 DIAGNOSIS — E1122 Type 2 diabetes mellitus with diabetic chronic kidney disease: Secondary | ICD-10-CM | POA: Insufficient documentation

## 2017-03-05 DIAGNOSIS — Z87891 Personal history of nicotine dependence: Secondary | ICD-10-CM | POA: Diagnosis not present

## 2017-03-05 DIAGNOSIS — N183 Chronic kidney disease, stage 3 (moderate): Secondary | ICD-10-CM | POA: Diagnosis not present

## 2017-03-05 DIAGNOSIS — K219 Gastro-esophageal reflux disease without esophagitis: Secondary | ICD-10-CM | POA: Diagnosis not present

## 2017-03-05 DIAGNOSIS — I110 Hypertensive heart disease with heart failure: Secondary | ICD-10-CM | POA: Diagnosis not present

## 2017-03-05 DIAGNOSIS — Z7982 Long term (current) use of aspirin: Secondary | ICD-10-CM | POA: Diagnosis not present

## 2017-03-05 DIAGNOSIS — I2510 Atherosclerotic heart disease of native coronary artery without angina pectoris: Secondary | ICD-10-CM | POA: Diagnosis not present

## 2017-03-05 DIAGNOSIS — I10 Essential (primary) hypertension: Secondary | ICD-10-CM

## 2017-03-05 DIAGNOSIS — E119 Type 2 diabetes mellitus without complications: Secondary | ICD-10-CM

## 2017-03-05 DIAGNOSIS — Z6839 Body mass index (BMI) 39.0-39.9, adult: Secondary | ICD-10-CM | POA: Insufficient documentation

## 2017-03-05 DIAGNOSIS — G8929 Other chronic pain: Secondary | ICD-10-CM | POA: Insufficient documentation

## 2017-03-05 DIAGNOSIS — I130 Hypertensive heart and chronic kidney disease with heart failure and stage 1 through stage 4 chronic kidney disease, or unspecified chronic kidney disease: Secondary | ICD-10-CM | POA: Insufficient documentation

## 2017-03-05 DIAGNOSIS — Z79899 Other long term (current) drug therapy: Secondary | ICD-10-CM | POA: Insufficient documentation

## 2017-03-05 DIAGNOSIS — R079 Chest pain, unspecified: Secondary | ICD-10-CM

## 2017-03-05 DIAGNOSIS — I5042 Chronic combined systolic (congestive) and diastolic (congestive) heart failure: Secondary | ICD-10-CM | POA: Insufficient documentation

## 2017-03-05 DIAGNOSIS — M069 Rheumatoid arthritis, unspecified: Secondary | ICD-10-CM | POA: Diagnosis not present

## 2017-03-05 DIAGNOSIS — I481 Persistent atrial fibrillation: Secondary | ICD-10-CM | POA: Insufficient documentation

## 2017-03-05 DIAGNOSIS — I129 Hypertensive chronic kidney disease with stage 1 through stage 4 chronic kidney disease, or unspecified chronic kidney disease: Secondary | ICD-10-CM | POA: Diagnosis not present

## 2017-03-05 DIAGNOSIS — R0602 Shortness of breath: Secondary | ICD-10-CM | POA: Diagnosis not present

## 2017-03-05 LAB — CBC
HEMATOCRIT: 39.5 % (ref 39.0–52.0)
HEMOGLOBIN: 12.4 g/dL — AB (ref 13.0–17.0)
MCH: 29.8 pg (ref 26.0–34.0)
MCHC: 31.4 g/dL (ref 30.0–36.0)
MCV: 95 fL (ref 78.0–100.0)
Platelets: 179 10*3/uL (ref 150–400)
RBC: 4.16 MIL/uL — ABNORMAL LOW (ref 4.22–5.81)
RDW: 15.1 % (ref 11.5–15.5)
WBC: 9.8 10*3/uL (ref 4.0–10.5)

## 2017-03-05 LAB — BASIC METABOLIC PANEL
ANION GAP: 11 (ref 5–15)
BUN: 19 mg/dL (ref 6–20)
CHLORIDE: 105 mmol/L (ref 101–111)
CO2: 29 mmol/L (ref 22–32)
Calcium: 8.5 mg/dL — ABNORMAL LOW (ref 8.9–10.3)
Creatinine, Ser: 1.68 mg/dL — ABNORMAL HIGH (ref 0.61–1.24)
GFR calc Af Amer: 49 mL/min — ABNORMAL LOW (ref >60)
GFR, EST NON AFRICAN AMERICAN: 42 mL/min — AB (ref >60)
GLUCOSE: 211 mg/dL — AB (ref 65–99)
POTASSIUM: 4.1 mmol/L (ref 3.5–5.1)
Sodium: 145 mmol/L (ref 135–145)

## 2017-03-05 LAB — POCT GLYCOSYLATED HEMOGLOBIN (HGB A1C): Hemoglobin A1C: 7.6

## 2017-03-05 LAB — TROPONIN I: Troponin I: 0.04 ng/mL (ref <0.03)

## 2017-03-05 LAB — GLUCOSE, POCT (MANUAL RESULT ENTRY): POC Glucose: 161 mg/dl — AB (ref 70–99)

## 2017-03-05 MED ORDER — ASPIRIN 81 MG PO CHEW
324.0000 mg | CHEWABLE_TABLET | Freq: Once | ORAL | Status: AC
  Administered 2017-03-05: 324 mg via ORAL

## 2017-03-05 NOTE — ED Notes (Signed)
Critical troponin called from lab.  Dr. Johnney Killian made aware

## 2017-03-05 NOTE — Progress Notes (Signed)
Patient left with EMS.

## 2017-03-05 NOTE — Discharge Instructions (Signed)
Return here as needed.  Follow-up with your cardiologist. °

## 2017-03-05 NOTE — ED Provider Notes (Signed)
[facility] DEPT Provider Note   CSN: [id number] Arrival date & time: 03/05/17  1455     History   Chief Complaint Chief Complaint  Patient presents with  . Chest Pain    HPI Adrion Menz is a 61 y.o. male.  HPI Patient presents to the emergency department with chest pain that lasted for about 5 minutes while at his doctor's office.  The patient states that the doctor sent him to the emergency department for further evaluation.  Patient is had chest pain similar to this in the past.  He states he did not take any nitroglycerin.  They did give him aspirin in route.  The patient states that nothing seemed to make the condition better or worse. The patient denies  shortness of breath, headache,blurred vision, neck pain, fever, cough, weakness, numbness, dizziness, anorexia, edema, abdominal pain, nausea, vomiting, diarrhea, rash, back pain, dysuria, hematemesis, bloody stool, near syncope, or syncope. Past Medical History:  Diagnosis Date  . Benign colon polyp 08/01/2013   [facility] in [place]. large base tranverse colon polyp was biopsied. polyp was benign with minimal surface hyperplastic change.  . Chronic combined systolic and diastolic CHF (congestive heart failure) ([place])   . Chronic pain   . CKD (chronic kidney disease), stage III   . Diabetes mellitus without complication ([place])   . Diverticulosis   . Esophageal hiatal hernia 07/29/2013   confirmed on EGD   . Esophageal stricture   . Essential hypertension   . Gastritis 07/29/2013   confirmed on EGD, bx done an negative for intestinal metaplasia, dsyplasia or H. pylori. normal gastric emptying study done 07/13/2013.  [name] GERD (gastroesophageal reflux disease)   . Morbid obesity ([place])   . Non-obstructive CAD    a. 02/2013 Cath ([place]): nonobs dzs;  b. 09/2014 Myoview ([place]): EF 55%, no ischemia;  c. 12/2014 Echo: EF 50-55%, gr1 DD, no effusion.  . Persistent atrial fibrillation ([place])    a. 02/2013 s/p rfca in [place], [place]-->prev on Xarelto, d/c'd 2/2 anemia, ? GIB.  [name] Rheumatoid arthritis ([place])   . Sigmoid diverticulosis 08/01/2013   confirmed on colonscopy. record scanned into chart    Patient Active Problem List   Diagnosis Date Noted  . Dysphagia 01/09/2017  . Hyperlipidemia 12/23/2016  . Chronic combined systolic and diastolic CHF (congestive heart failure) ([place]) 09/09/2016  . Diabetes mellitus with complication ([place])   . Diabetic retinopathy ([place]) 07/22/2016  . Diabetic polyneuropathy associated with type 2 diabetes mellitus ([place]) 06/10/2016  . Myalgia and myositis 03/31/2016  . Dyspnea 02/14/2016  . Dyspnea on exertion 02/09/2016  . Abdominal bloating 02/05/2016  . Chronic gouty arthritis 11/29/2015  . Ache in joint 11/27/2015  . LBP (low back pain) 11/27/2015  . Arthralgia of multiple joints 11/27/2015  . Psoriasis 11/27/2015  . Coronary artery calcification seen on CAT scan   . Breast pain in male 11/14/2015  . Sinusitis, chronic 10/16/2015  . Insomnia 10/16/2015  . Combined fat and carbohydrate induced hyperlipemia 10/04/2015  . Adult BMI 30+ 10/04/2015  . New onset of headaches after age 1 09/20/2015  . OSA (obstructive sleep apnea) 07/12/2015  . Joint pain 06/01/2015  . Low serum testosterone level 05/18/2015  . Depression 05/16/2015  . Chronic pain   . CAD (coronary artery disease) 12/24/2014  . Persistent atrial fibrillation ([place]) 12/24/2014  . Essential hypertension 12/24/2014  . CKD (chronic kidney disease) stage 3, GFR 30-59 ml/min 12/24/2014  . PUD (peptic ulcer disease) 12/24/2014  .  DM2 (diabetes mellitus, type 2) ([place]) 12/24/2014  . Chest pain 12/23/2014    Past Surgical History:  Procedure Laterality Date  . CARDIAC CATHETERIZATION  05/2014   ablation for atrial fibrillation  . CARDIAC CATHETERIZATION N/A 11/16/2015   Procedure: Left Heart Cath and Coronary Angiography;  Surgeon: Leonie Man, MD;  Location: [facility];  Service:  Cardiovascular;  Laterality: N/A;  . COLON RESECTION  09/2013   due to large, abnormal polpy. non cancerous per patient.   . COLON SURGERY  09/2014   colon resection        Home Medications    Prior to Admission medications   Medication Sig Start Date End Date Taking? Authorizing Provider  albuterol (PROVENTIL HFA;VENTOLIN HFA) 108 (90 Base) MCG/ACT inhaler Inhale 2 puffs into the lungs every 6 (six) hours as needed for wheezing or shortness of breath. 09/29/16  Yes Funches, Adriana Mccallum, MD  aspirin (EQ ASPIRIN ADULT LOW DOSE) 81 MG EC tablet Take 1 tablet (81 mg total) by mouth daily. Swallow whole. 02/05/17  Yes Funches, Josalyn, MD  atorvastatin (LIPITOR) 40 MG tablet Take 1 tablet (40 mg total) by mouth daily. 02/05/17  Yes Funches, Josalyn, MD  carvedilol (COREG) 25 MG tablet Take 1 tablet (25 mg total) by mouth 2 (two) times daily. 02/05/17  Yes Funches, Josalyn, MD  Eszopiclone 3 MG TABS Take 1 tablet (3 mg total) by mouth at bedtime. Take immediately before bedtime 02/05/17  Yes Funches, Josalyn, MD  furosemide (LASIX) 40 MG tablet Take 1 tablet by mouth every morning Patient taking differently: Take 40 mg by mouth every morning. Take 1 tablet by mouth every morning 02/05/17  Yes Funches, Josalyn, MD  HYDROcodone-acetaminophen (NORCO/VICODIN) 5-325 MG tablet Take 1 tablet by mouth every 8 (eight) hours as needed for moderate pain. Per pain management   Yes [provider]  insulin aspart (NOVOLOG FLEXPEN) 100 UNIT/ML FlexPen Inject 14-24 Units into the skin 3 (three) times daily with meals. 02/05/17  Yes Funches, Josalyn, MD  Insulin Detemir (LEVEMIR FLEXTOUCH) 100 UNIT/ML Pen Inject 50 Units into the skin daily at 10 pm. 02/05/17  Yes Funches, Josalyn, MD  isosorbide-hydrALAZINE (BIDIL) 20-37.5 MG tablet Take 1 tablet by mouth 3 (three) times daily. 02/05/17  Yes Funches, Josalyn, MD  lidocaine (XYLOCAINE) 5 % ointment Apply 1 application topically 4 (four) times daily as needed. Apply  to feet Patient taking differently: Apply 1 application topically 4 (four) times daily as needed (for foot pain).  08/19/16  Yes Funches, Josalyn, MD  losartan (COZAAR) 100 MG tablet Take 1 tablet (100 mg total) by mouth daily. 02/05/17  Yes Funches, Josalyn, MD  metFORMIN (GLUCOPHAGE-XR) 500 MG 24 hr tablet Take 2 tablets (1,000 mg total) by mouth 2 (two) times daily. 02/05/17  Yes Funches, Adriana Mccallum, MD  predniSONE (DELTASONE) 10 MG tablet Take 1 tablet (10 mg total) by mouth daily with breakfast. 02/05/17  Yes Funches, Josalyn, MD  pregabalin (LYRICA) 100 MG capsule Take 1 capsule (100 mg total) by mouth 3 (three) times daily. 02/05/17  Yes Funches, Josalyn, MD  ranitidine (ZANTAC) 150 MG tablet Take 1 tablet (150 mg total) by mouth at bedtime. Patient taking differently: Take 150 mg by mouth daily before breakfast.  02/05/17  Yes Funches, Josalyn, MD  Secukinumab (COSENTYX) 150 MG/ML SOSY Inject 150 mg into the skin every 28 (twenty-eight) days.  06/02/16  Yes [provider]  vitamin B-12 (CYANOCOBALAMIN) 1000 MCG tablet Take 1 tablet (1,000 mcg total) by mouth  daily. 10/21/16  Yes Funches, Adriana Mccallum, MD  ACCU-CHEK AVIVA PLUS test strip USE ONE STRIP TO CHECK BLOOD GLUCOSE 3 TIMES DAILY 07/30/16   Boykin Nearing, MD  ACCU-CHEK SOFTCLIX LANCETS lancets USE AS DIRECTED 10/22/16   Funches, Adriana Mccallum, MD  ACCU-CHEK SOFTCLIX LANCETS lancets 1 each by Other route 3 (three) times daily. Use as instructed 10/21/16   Boykin Nearing, MD  BD PEN NEEDLE NANO U/F 32G X 4 MM MISC USE ONE PEN NEEDLE THREE TIMES DAILY 08/25/16   Tresa Garter, MD  Blood Glucose Monitoring Suppl (ACCU-CHEK AVIVA PLUS) w/Device KIT 1 Device by Does not apply route 3 (three) times daily after meals. E11.9 02/05/17   Funches, Adriana Mccallum, MD  Blood Pressure KIT 1 each by Does not apply route 2 (two) times daily. 09/09/16   Funches, Adriana Mccallum, MD  glucose blood (ACCU-CHEK AVIVA PLUS) test strip 1 each by Other route 3 (three) times  daily. E11.9 02/05/17   Funches, Adriana Mccallum, MD  leflunomide (ARAVA) 10 MG tablet Take 10 mg by mouth daily. 02/23/17   [provider]    Family History Family History  Problem Relation Age of Onset  . Hypertension Mother   . Diabetes Mother   . COPD Mother   . Lung cancer Mother        Smoker   . Hypertension Father   . Diabetes Father   . Heart Problems Father   . COPD Father   . Colon cancer Neg Hx   . Stomach cancer Neg Hx     Social History Social History  Substance Use Topics  . Smoking status: Former Smoker    Packs/day: 0.50    Years: 10.00    Types: Cigarettes    Quit date: 04/11/2008  . Smokeless tobacco: Never Used  . Alcohol use No     Allergies   Patient has no known allergies.   Review of Systems Review of Systems All other systems negative except as documented in the HPI. All pertinent positives and negatives as reviewed in the HPI.  Physical Exam Updated Vital Signs BP 98/61 (BP Location: Right Arm)   Pulse 72   Temp 99.1 F (37.3 C) (Oral)   Resp 15   Ht '5\' 6"'$  (1.676 m)   Wt 103 kg (227 lb)   SpO2 94%   BMI 36.64 kg/m   Physical Exam  Constitutional: He is oriented to person, place, and time. He appears well-developed and well-nourished. No distress.  HENT:  Head: Normocephalic and atraumatic.  Mouth/Throat: Oropharynx is clear and moist.  Eyes: Pupils are equal, round, and reactive to light.  Neck: Normal range of motion. Neck supple.  Cardiovascular: Normal rate, regular rhythm and normal heart sounds.  Exam reveals no gallop and no friction rub.   No murmur heard. Pulmonary/Chest: Effort normal and breath sounds normal. No respiratory distress. He has no wheezes.  Abdominal: Soft. Bowel sounds are normal. He exhibits no distension. There is no tenderness.  Neurological: He is alert and oriented to person, place, and time. He exhibits normal muscle tone. Coordination normal.  Skin: Skin is warm and dry. Capillary refill takes  less than 2 seconds. No rash noted. No erythema.  Psychiatric: He has a normal mood and affect. His behavior is normal.  Nursing note and vitals reviewed.    ED Treatments / Results  Labs (all labs ordered are listed, but only abnormal results are displayed) Labs Reviewed  BASIC METABOLIC PANEL - Abnormal; Notable for the following:  Result Value   Glucose, Bld 211 (*)    Creatinine, Ser 1.68 (*)    Calcium 8.5 (*)    GFR calc non Af Amer 42 (*)    GFR calc Af Amer 49 (*)    All other components within normal limits  CBC - Abnormal; Notable for the following:    RBC 4.16 (*)    Hemoglobin 12.4 (*)    All other components within normal limits  TROPONIN I - Abnormal; Notable for the following:    Troponin I 0.04 (*)    All other components within normal limits    EKG  EKG Interpretation  Date/Time:  Thursday March 05 2017 14:57:18 EDT Ventricular Rate:  74 PR Interval:  106 QRS Duration: 88 QT Interval:  394 QTC Calculation: 437 R Axis:   0 Text Interpretation:  Sinus rhythm with short PR T wave abnormality, consider inferolateral ischemia Abnormal ECG No acute changes Confirmed by Isla Pence [phone]) on 03/05/2017 3:26:57 PM       Radiology Dg Chest 2 View  Result Date: 03/05/2017 CLINICAL DATA:  Chest pain and shortness of Breath EXAM: CHEST  2 VIEW COMPARISON:  09/18/2016 FINDINGS: Cardiac shadow is mildly enlarged but stable. The lungs are well aerated bilaterally. No focal infiltrate or sizable effusion is seen. Minimal linear scarring is noted in the right middle lobe best seen on the lateral projection. No acute bony abnormality is seen. Degenerative changes of the thoracic spine are noted. IMPRESSION: No active cardiopulmonary disease. Electronically Signed   By: Inez Catalina M.D.   On: 03/05/2017 16:19    Procedures Procedures (including critical care time)  Medications Ordered in ED Medications - No data to display   Initial Impression /  Assessment and Plan / ED Course  I have reviewed the triage vital signs and the nursing notes.  Pertinent labs & imaging results that were available during my care of the patient were reviewed by me and considered in my medical decision making (see chart for details).     Did a sugar decision-making with patient on admission to the hospital.  I did advise him that we could observe him, but his testing here today was negative.  He states he does not want to be admitted to the hospital and like to be discharged home.  The patient's troponin was done about 5-6 hours after the onset of the chest pain.  Final Clinical Impressions(s) / ED Diagnoses   Final diagnoses:  None    New Prescriptions New Prescriptions   No medications on file     Dalia Heading, Hershal Coria 03/07/17 [place], [place], MD 03/09/17 [phone]

## 2017-03-05 NOTE — ED Triage Notes (Signed)
Pt to ED via [facility] from [place] where he went for a check up and started having central chest pain,.  Pt was given ASA 324mg  and became pain free,.  On arrival to ED pt denies any chest pain.  Pt alert and oriented x's 3, skin warm and dry, color appropriate.

## 2017-03-05 NOTE — Progress Notes (Signed)
Marco [name], is a 61 y.o. male  TGG:[id number]  [id number]  DOB - [birth year]  Subjective:  Chief Complaint and HPI: Marco [name] is a 61 y.o. male here today for regular DM f/up when he developed CP on exertion that was radiating into his neck and upper back.  He also c/o SOB.  I immediately started assessing the patient as vitals and EKG were being performed.  He also c/o feeling weak.  He is by himself today.  The pain is starting to subside with rest.  ROS:   Constitutional:  No f/c, No night sweats, No unexplained weight loss. EENT:  No vision changes, No blurry vision, No hearing changes. No mouth, throat, or ear problems.  Respiratory: No cough, + SOB Cardiac: +CP, no palpitations GI:  No abd pain, No N/V/D. GU: No Urinary s/sx Musculoskeletal: No joint pain Neuro: No headache, no dizziness, no motor weakness.  Skin: No rash Endocrine:  No polydipsia. No polyuria.  Psych: Denies SI/HI  No problems updated.  ALLERGIES: Not on File  PAST MEDICAL HISTORY: Past Medical History:  Diagnosis Date  . Benign colon polyp 08/01/2013   [facility] in [place]. large base tranverse colon polyp was biopsied. polyp was benign with minimal surface hyperplastic change.  . Chronic combined systolic and diastolic CHF (congestive heart failure) ([place])   . Chronic pain   . CKD (chronic kidney disease), stage III   . Diabetes mellitus without complication ([place])   . Diverticulosis   . Esophageal hiatal hernia 07/29/2013   confirmed on EGD   . Esophageal stricture   . Essential hypertension   . Gastritis 07/29/2013   confirmed on EGD, bx done an negative for intestinal metaplasia, dsyplasia or H. pylori. normal gastric emptying study done 07/13/2013.  [name] GERD (gastroesophageal reflux disease)   . Morbid obesity ([place])   . Non-obstructive CAD    a. 02/2013 Cath ([place]): nonobs dzs;  b. 09/2014 Myoview ([place]): EF 55%, no ischemia;  c. 12/2014 Echo: EF 50-55%, gr1 DD, no effusion.   . Persistent atrial fibrillation ([place])    a. 02/2013 s/p rfca in [place], [place]-->prev on Xarelto, d/c'd 2/2 anemia, ? GIB.  [name] Rheumatoid arthritis ([place])   . Sigmoid diverticulosis 08/01/2013   confirmed on colonscopy. record scanned into chart    MEDICATIONS AT HOME: Prior to Admission medications   Medication Sig Start Date End Date Taking? Authorizing Provider  ACCU-CHEK AVIVA PLUS test strip USE ONE STRIP TO CHECK BLOOD GLUCOSE 3 TIMES DAILY 07/30/16   Boykin Nearing, MD  ACCU-CHEK SOFTCLIX LANCETS lancets USE AS DIRECTED 10/22/16   Funches, Adriana Mccallum, MD  ACCU-CHEK SOFTCLIX LANCETS lancets 1 each by Other route 3 (three) times daily. Use as instructed 10/21/16   Boykin Nearing, MD  albuterol (PROVENTIL HFA;VENTOLIN HFA) 108 (90 Base) MCG/ACT inhaler Inhale 2 puffs into the lungs every 6 (six) hours as needed for wheezing or shortness of breath. 09/29/16   Funches, Adriana Mccallum, MD  aspirin (EQ ASPIRIN ADULT LOW DOSE) 81 MG EC tablet Take 1 tablet (81 mg total) by mouth daily. Swallow whole. 02/05/17   Funches, Adriana Mccallum, MD  atorvastatin (LIPITOR) 40 MG tablet Take 1 tablet (40 mg total) by mouth daily. 02/05/17   Funches, Adriana Mccallum, MD  BD PEN NEEDLE NANO U/F 32G X 4 MM MISC USE ONE PEN NEEDLE THREE TIMES DAILY 08/25/16   Tresa Garter, MD  Blood Glucose Monitoring Suppl (ACCU-CHEK AVIVA PLUS) w/Device KIT 1 Device by Does not apply route  3 (three) times daily after meals. E11.9 02/05/17   Funches, Gerilyn Nestle, MD  Blood Pressure KIT 1 each by Does not apply route 2 (two) times daily. 09/09/16   Funches, Gerilyn Nestle, MD  carvedilol (COREG) 25 MG tablet Take 1 tablet (25 mg total) by mouth 2 (two) times daily. 02/05/17   Funches, Gerilyn Nestle, MD  Eszopiclone 3 MG TABS Take 1 tablet (3 mg total) by mouth at bedtime. Take immediately before bedtime 02/05/17   Dessa Phi, MD  furosemide (LASIX) 40 MG tablet Take 1 tablet by mouth every morning 02/05/17   Funches, Gerilyn Nestle, MD  glucose blood (ACCU-CHEK AVIVA  PLUS) test strip 1 each by Other route 3 (three) times daily. E11.9 02/05/17   Funches, Gerilyn Nestle, MD  HYDROcodone-acetaminophen (NORCO/VICODIN) 5-325 MG tablet Take 1 tablet by mouth every 8 (eight) hours as needed for moderate pain. Per pain management    [provider]  insulin aspart (NOVOLOG FLEXPEN) 100 UNIT/ML FlexPen Inject 14-24 Units into the skin 3 (three) times daily with meals. 02/05/17   Funches, Gerilyn Nestle, MD  Insulin Detemir (LEVEMIR FLEXTOUCH) 100 UNIT/ML Pen Inject 50 Units into the skin daily at 10 pm. 02/05/17   Funches, Gerilyn Nestle, MD  isosorbide-hydrALAZINE (BIDIL) 20-37.5 MG tablet Take 1 tablet by mouth 3 (three) times daily. 02/05/17   Funches, Gerilyn Nestle, MD  lidocaine (XYLOCAINE) 5 % ointment Apply 1 application topically 4 (four) times daily as needed. Apply to feet 08/19/16   Funches, Gerilyn Nestle, MD  losartan (COZAAR) 100 MG tablet Take 1 tablet (100 mg total) by mouth daily. 02/05/17   Funches, Gerilyn Nestle, MD  metFORMIN (GLUCOPHAGE-XR) 500 MG 24 hr tablet Take 2 tablets (1,000 mg total) by mouth 2 (two) times daily. 02/05/17   Funches, Gerilyn Nestle, MD  predniSONE (DELTASONE) 10 MG tablet Take 1 tablet (10 mg total) by mouth daily with breakfast. 02/05/17   Dessa Phi, MD  pregabalin (LYRICA) 100 MG capsule Take 1 capsule (100 mg total) by mouth 3 (three) times daily. 02/05/17   Funches, Gerilyn Nestle, MD  ranitidine (ZANTAC) 150 MG tablet Take 1 tablet (150 mg total) by mouth at bedtime. 02/05/17   Funches, Gerilyn Nestle, MD  Secukinumab (COSENTYX) 150 MG/ML SOSY Inject 150 mg into the skin every 28 (twenty-eight) days.  06/02/16   [provider]  vitamin B-12 (CYANOCOBALAMIN) 1000 MCG tablet Take 1 tablet (1,000 mcg total) by mouth daily. 10/21/16   Dessa Phi, MD     Objective:  EXAM:   Vitals:   03/05/17 1352  BP: 90/60  Pulse: 80  SpO2: 94%    General appearance : A&OX3. NAD. Non-toxic-appearing, he does appear pale and his skin is diaphoretic. HEENT: Atraumatic and  Normocephalic.  PERRLA. EOM intact.   Neck: supple, no JVD. No cervical lymphadenopathy. No thyromegaly Chest/Lungs:  Breathing-non-labored, Good air entry bilaterally, breath sounds normal without rales, rhonchi, or wheezing  CVS: S1 S2 regular, no murmurs, gallops, rubs Extremities: Bilateral Lower Ext shows no edema, both legs are warm to touch with = pulse throughout Neurology:  CN II-XII grossly intact, Non focal.   Psych:  TP linear. J/I WNL. Normal speech. Appropriate eye contact and affect.  Skin:  No Rash  Data Review Lab Results  Component Value Date   HGBA1C 6.9 12/04/2016   HGBA1C 7.5 (H) 09/02/2016   HGBA1C 7.7 08/19/2016     Assessment & Plan   1. Essential hypertension BP is low today  2. Type 2 diabetes mellitus without complication, with long-term current use of insulin (HCC) Will  address at f/up.  A1C today=7.6. Glucose 161 today - Glucose (CBG) - HgB A1c  3. Chest pain, unspecified type Abnormal EKG with T wave inversion on lead II 911 activated for R/O MI.  2L O2 started on patient. SL nitro held due to low BP.  - aspirin chewable tablet 324 mg; Chew 4 tablets (324 mg total) by mouth once. Discussed and reviewed case and EKG with Dr Doreene Burke Patient have been counseled extensively about nutrition and exercise  Return for after cardiac R/O ED visit.  The patient was given clear instructions to go to ER or return to medical center if symptoms don't improve, worsen or new problems develop. The patient verbalized understanding. The patient was told to call to get lab results if they haven't heard anything in the next week.     Freeman Caldron, [name] [facility] and [place], [place]   03/05/2017, 2:12 PM

## 2017-03-09 ENCOUNTER — Telehealth (HOSPITAL_COMMUNITY): Payer: Self-pay | Admitting: Pharmacist

## 2017-03-09 NOTE — Telephone Encounter (Signed)
Cardiac Rehab Medication Review by a Pharmacist  Does the patient  feel that his/her medications are working for him/her?  yes  Has the patient been experiencing any side effects to the medications prescribed?  no  Does the patient measure his/her own blood pressure or blood glucose at home?  no   Does the patient have any problems obtaining medications due to transportation or finances?   no  Understanding of regimen: good Understanding of indications: good Potential of compliance: good    Pharmacist comments: Marco [name] is a pleasant 61 yo male presenting to cardiac rehab clinic for medication reconcilation. He reports compliance with all medications and has not experienced any side effects. He states his medications are covered by insurance and reports no other issues or questions.    [name], PharmD PGY1 [place] Resident Pager: [phone] 03/09/2017 2:34 PM

## 2017-03-10 ENCOUNTER — Telehealth (HOSPITAL_COMMUNITY): Payer: Self-pay | Admitting: *Deleted

## 2017-03-10 ENCOUNTER — Telehealth (HOSPITAL_COMMUNITY): Payer: Self-pay | Admitting: Cardiac Rehabilitation

## 2017-03-10 NOTE — Telephone Encounter (Signed)
pc to pt to discuss cardiac rehab orientation.  Pt advised of need for further evaluation prior to starting rehab. Pt informed he would be contacted by Dr. Theodosia Blender office to schedule stress test.  Cardiac rehab orientation cancelled.  Pt verbalized understanidng.

## 2017-03-10 NOTE — Telephone Encounter (Signed)
-----   Message from Sueanne Margarita, MD sent at 03/09/2017  8:36 PM EDT ----- Regarding: RE: Ok to proceed with cardiac rehab as scheduled [place],  Please order a lexiscan myoview to rule out ischemia as ETT done to qualify for cardiac rehab was submaximal and nondiagnostic for ischemia.  If no ischemia then ok to go back to [place] ----- Message ----- From: Rowe Pavy, RN Sent: 03/09/2017   6:53 PM To: Sueanne Margarita, MD Subject: Ok to proceed with cardiac rehab as scheduled  [name],  The above patient was referred to Cardiac rehab with the diagnosis of Combined CHF.  Pt has medicaid and did have a gxt completed prior to scheduling him for cardiac rehab. He is to attend CR on this Thursday. Pt was sent to the ER by the MD at [facility] due to CP lasting 5 minutes no nitro given asa was given en route.  Pt was seen and evaluated by the ER MD.  First Troponin was negative. Pt told initial evaluation was negative but he could be admitted overnight for observation.  Pt declined.   May pt proceed with cardiac rehab as scheduled or would you like for him to follow up in the office (last office visit was in June)  Thanks for the advisement Cherre Huger, BSN Cardiac and Training and development officer

## 2017-03-12 ENCOUNTER — Inpatient Hospital Stay (HOSPITAL_COMMUNITY): Admission: RE | Admit: 2017-03-12 | Payer: Medicaid Other | Source: Ambulatory Visit

## 2017-03-16 ENCOUNTER — Ambulatory Visit: Payer: Medicaid Other | Admitting: Internal Medicine

## 2017-03-16 ENCOUNTER — Telehealth: Payer: Self-pay

## 2017-03-16 ENCOUNTER — Ambulatory Visit (HOSPITAL_COMMUNITY): Payer: Medicaid Other

## 2017-03-16 ENCOUNTER — Ambulatory Visit: Payer: Medicaid Other | Attending: Family Medicine | Admitting: Physician Assistant

## 2017-03-16 VITALS — BP 74/48 | HR 85 | Temp 98.5°F | Resp 20 | Wt 226.0 lb

## 2017-03-16 DIAGNOSIS — R0789 Other chest pain: Secondary | ICD-10-CM | POA: Insufficient documentation

## 2017-03-16 DIAGNOSIS — E119 Type 2 diabetes mellitus without complications: Secondary | ICD-10-CM

## 2017-03-16 DIAGNOSIS — I959 Hypotension, unspecified: Secondary | ICD-10-CM | POA: Diagnosis present

## 2017-03-16 DIAGNOSIS — Z7982 Long term (current) use of aspirin: Secondary | ICD-10-CM | POA: Insufficient documentation

## 2017-03-16 DIAGNOSIS — I130 Hypertensive heart and chronic kidney disease with heart failure and stage 1 through stage 4 chronic kidney disease, or unspecified chronic kidney disease: Secondary | ICD-10-CM | POA: Insufficient documentation

## 2017-03-16 DIAGNOSIS — I5042 Chronic combined systolic (congestive) and diastolic (congestive) heart failure: Secondary | ICD-10-CM | POA: Insufficient documentation

## 2017-03-16 DIAGNOSIS — Z794 Long term (current) use of insulin: Secondary | ICD-10-CM | POA: Diagnosis not present

## 2017-03-16 DIAGNOSIS — M109 Gout, unspecified: Secondary | ICD-10-CM | POA: Insufficient documentation

## 2017-03-16 DIAGNOSIS — G4733 Obstructive sleep apnea (adult) (pediatric): Secondary | ICD-10-CM | POA: Diagnosis not present

## 2017-03-16 DIAGNOSIS — N183 Chronic kidney disease, stage 3 (moderate): Secondary | ICD-10-CM | POA: Diagnosis not present

## 2017-03-16 DIAGNOSIS — E1122 Type 2 diabetes mellitus with diabetic chronic kidney disease: Secondary | ICD-10-CM | POA: Diagnosis not present

## 2017-03-16 LAB — GLUCOSE, POCT (MANUAL RESULT ENTRY): POC Glucose: 187 mg/dl — AB (ref 70–99)

## 2017-03-16 NOTE — Progress Notes (Signed)
. Chief Complaint: ED follow up  Subjective: 61 year old male with a history of diabetes mellitus type 2, hypertension, chronic combined heart failure, chronic kidney disease stage III, obstructive sleep apnea and gouty arthritis who presented here on 03/05/2017 with complaints of chest pain. Ultimately he was sent to the emergency department for further evaluation. His troponin was 0.04. His chest x-ray was normal. His EKG showed some T-wave inversion laterally. A recent echocardiogram was within normal limits. Cardiac catheterization last year showed minimal obstructive disease. There was a low suspicion for cardiac etiology and he was sent home for outpatient follow-up.  From a chest pain standpoint he states that he feels better. He doubts that it was cardiac. He states he feels like it was in his muscles. No shortness of breath. No lower extremity edema. He states that he is compliant with his medications. Some dizziness today.    ROS:  GEN: denies fever or chills, denies change in weight Skin: denies lesions or rashes HEENT: denies headache, earache, epistaxis, sore throat, or neck pain LUNGS: denies SHOB, dyspnea, PND, orthopnea CV: denies CP or palpitations ABD: denies abd pain, N or V EXT: denies muscle spasms or swelling; no pain in lower ext, no weakness NEURO: denies numbness or tingling, denies sz, stroke or TIA   Objective:  Vitals:   03/16/17 1033 03/16/17 1047  BP: (!) 89/57 (!) 74/48  Pulse: 85   Resp: 20   Temp: 98.5 F (36.9 C)   SpO2: 93%   Weight: 226 lb (102.5 kg)     Physical Exam:  General: in no acute distress. HEENT: no pallor, no icterus, moist oral mucosa, no JVD, no lymphadenopathy Heart: Normal  s1 &s2  Regular rate and rhythm, without murmurs, rubs, gallops. Lungs: Clear to auscultation bilaterally. Abdomen: Soft, nontender, nondistended, positive bowel sounds. Extremities: No clubbing cyanosis or edema with positive pedal pulses. Neuro: Alert,  awake, oriented x3, nonfocal.   Medications: Prior to Admission medications   Medication Sig Start Date End Date Taking? Authorizing Provider  albuterol (PROVENTIL HFA;VENTOLIN HFA) 108 (90 Base) MCG/ACT inhaler Inhale 2 puffs into the lungs every 6 (six) hours as needed for wheezing or shortness of breath. 09/29/16  Yes Funches, Adriana Mccallum, MD  aspirin (EQ ASPIRIN ADULT LOW DOSE) 81 MG EC tablet Take 1 tablet (81 mg total) by mouth daily. Swallow whole. 02/05/17  Yes Funches, Josalyn, MD  atorvastatin (LIPITOR) 40 MG tablet Take 1 tablet (40 mg total) by mouth daily. 02/05/17  Yes Funches, Josalyn, MD  carvedilol (COREG) 25 MG tablet Take 1 tablet (25 mg total) by mouth 2 (two) times daily. 02/05/17  Yes Funches, Josalyn, MD  furosemide (LASIX) 40 MG tablet Take 1 tablet by mouth every morning Patient taking differently: Take 40 mg by mouth every morning. Take 1 tablet by mouth every morning 02/05/17  Yes Funches, Josalyn, MD  HYDROcodone-acetaminophen (NORCO/VICODIN) 5-325 MG tablet Take 1 tablet by mouth every 8 (eight) hours as needed for moderate pain. Per pain management   Yes [provider]  insulin aspart (NOVOLOG FLEXPEN) 100 UNIT/ML FlexPen Inject 14-24 Units into the skin 3 (three) times daily with meals. 02/05/17  Yes Funches, Josalyn, MD  Insulin Detemir (LEVEMIR FLEXTOUCH) 100 UNIT/ML Pen Inject 50 Units into the skin daily at 10 pm. 02/05/17  Yes Funches, Josalyn, MD  isosorbide-hydrALAZINE (BIDIL) 20-37.5 MG tablet Take 1 tablet by mouth 3 (three) times daily. 02/05/17  Yes Funches, Josalyn, MD  losartan (COZAAR) 100 MG tablet Take 1 tablet (100  mg total) by mouth daily. 02/05/17  Yes Funches, Josalyn, MD  metFORMIN (GLUCOPHAGE-XR) 500 MG 24 hr tablet Take 2 tablets (1,000 mg total) by mouth 2 (two) times daily. 02/05/17  Yes Funches, Gerilyn Nestle, MD  predniSONE (DELTASONE) 10 MG tablet Take 1 tablet (10 mg total) by mouth daily with breakfast. 02/05/17  Yes Funches, Josalyn, MD   pregabalin (LYRICA) 100 MG capsule Take 1 capsule (100 mg total) by mouth 3 (three) times daily. 02/05/17  Yes Funches, Josalyn, MD  ranitidine (ZANTAC) 150 MG tablet Take 1 tablet (150 mg total) by mouth at bedtime. Patient taking differently: Take 150 mg by mouth daily before breakfast.  02/05/17  Yes Funches, Josalyn, MD  Secukinumab (COSENTYX) 150 MG/ML SOSY Inject 150 mg into the skin every 28 (twenty-eight) days.  06/02/16  Yes [provider]  ACCU-CHEK AVIVA PLUS test strip USE ONE STRIP TO CHECK BLOOD GLUCOSE 3 TIMES DAILY 07/30/16   Dessa Phi, MD  ACCU-CHEK SOFTCLIX LANCETS lancets USE AS DIRECTED 10/22/16   Funches, Gerilyn Nestle, MD  ACCU-CHEK SOFTCLIX LANCETS lancets 1 each by Other route 3 (three) times daily. Use as instructed 10/21/16   Dessa Phi, MD  BD PEN NEEDLE NANO U/F 32G X 4 MM MISC USE ONE PEN NEEDLE THREE TIMES DAILY 08/25/16   Quentin Angst, MD  Blood Glucose Monitoring Suppl (ACCU-CHEK AVIVA PLUS) w/Device KIT 1 Device by Does not apply route 3 (three) times daily after meals. E11.9 02/05/17   Funches, Gerilyn Nestle, MD  Blood Pressure KIT 1 each by Does not apply route 2 (two) times daily. 09/09/16   Funches, Gerilyn Nestle, MD  Eszopiclone 3 MG TABS Take 1 tablet (3 mg total) by mouth at bedtime. Take immediately before bedtime Patient not taking: Reported on 03/16/2017 02/05/17   Dessa Phi, MD  fluorometholone (FML) 0.1 % ophthalmic suspension Place 1 drop into both eyes 3 (three) times daily.    [provider]  glucose blood (ACCU-CHEK AVIVA PLUS) test strip 1 each by Other route 3 (three) times daily. E11.9 02/05/17   Funches, Gerilyn Nestle, MD  leflunomide (ARAVA) 10 MG tablet Take 10 mg by mouth daily. 02/23/17   [provider]  lidocaine (XYLOCAINE) 5 % ointment Apply 1 application topically 4 (four) times daily as needed. Apply to feet Patient taking differently: Apply 1 application topically 4 (four) times daily as needed (for foot pain).   08/19/16   Funches, Gerilyn Nestle, MD  sulfaSALAzine (AZULFIDINE) 500 MG tablet Take 500 mg by mouth 2 (two) times daily.    [provider]  vitamin B-12 (CYANOCOBALAMIN) 1000 MCG tablet Take 1 tablet (1,000 mcg total) by mouth daily. Patient not taking: Reported on 03/09/2017 10/21/16   Dessa Phi, MD    Assessment: 1. Hypotension-> recent emergency department workup including labs okay. Afebrile today. 2. CP, noncardiac, chronic 3. Hx HTN  Plan: Considered keeping for IVFs but he states that he feels ok. Hold all antihypertensives (Coreg, Bidil, Cozaar) for 3 days. Recheck BP here on Thursday for further instructions. I did not repeat labs, just done last week an reviewed.  Follow up:3-4 days with BP check and 2-3 weeks with Dr. Laural Benes  The patient was given clear instructions to go to ER or return to medical center if symptoms don't improve, worsen or new problems develop. The patient verbalized understanding. The patient was told to call to get lab results if they haven't heard anything in the next week.   This note has been created with Dragon speech recognition  Engineer, drilling. Any transcriptional errors are unintentional.   Zettie Pho, [name] 03/16/2017, 11:17 AM

## 2017-03-16 NOTE — Telephone Encounter (Signed)
-----   Message from Sueanne Margarita, MD sent at 03/09/2017  8:36 PM EDT ----- Regarding: RE: Ok to proceed with cardiac rehab as scheduled [place],  Please order a lexiscan myoview to rule out ischemia as ETT done to qualify for cardiac rehab was submaximal and nondiagnostic for ischemia.  If no ischemia then ok to go back to [place] ----- Message ----- From: Rowe Pavy, RN Sent: 03/09/2017   6:53 PM To: Sueanne Margarita, MD Subject: Ok to proceed with cardiac rehab as scheduled  [name],  The above patient was referred to Cardiac rehab with the diagnosis of Combined CHF.  Pt has medicaid and did have a gxt completed prior to scheduling him for cardiac rehab. He is to attend CR on this Thursday. Pt was sent to the ER by the MD at [facility] due to CP lasting 5 minutes no nitro given asa was given en route.  Pt was seen and evaluated by the ER MD.  First Troponin was negative. Pt told initial evaluation was negative but he could be admitted overnight for observation.  Pt declined.   May pt proceed with cardiac rehab as scheduled or would you like for him to follow up in the office (last office visit was in June)  Thanks for the advisement Cherre Huger, BSN Cardiac and Training and development officer

## 2017-03-16 NOTE — Patient Instructions (Signed)
Do not take the following for 3 days (Mon pm, Tues, Wed): Coreg (Carvedilol) 25 mg Bidil 20/27.5 mg Cozaar 100 mg  On thurs am come here for a BP check with the nurse  Cont all diabetes medications

## 2017-03-16 NOTE — Progress Notes (Signed)
Discuss medication 9

## 2017-03-16 NOTE — Telephone Encounter (Signed)
Received automated message that number is not accepting calls at this time - unable to leave message.  Will try again later.

## 2017-03-18 ENCOUNTER — Ambulatory Visit (HOSPITAL_COMMUNITY): Payer: Medicaid Other

## 2017-03-19 ENCOUNTER — Ambulatory Visit: Payer: Medicaid Other | Attending: Family Medicine

## 2017-03-19 VITALS — BP 107/70 | HR 96 | Temp 99.3°F | Ht 64.0 in | Wt 226.0 lb

## 2017-03-19 DIAGNOSIS — Z048 Encounter for examination and observation for other specified reasons: Secondary | ICD-10-CM | POA: Diagnosis not present

## 2017-03-19 DIAGNOSIS — I959 Hypotension, unspecified: Secondary | ICD-10-CM

## 2017-03-19 DIAGNOSIS — I10 Essential (primary) hypertension: Secondary | ICD-10-CM | POA: Insufficient documentation

## 2017-03-19 NOTE — Progress Notes (Signed)
Pt was indtructed to return to office on 03/19/17 to get BP rechecked after stopping Carvedilol, bidil, and losartan. Pt previous BP was 74/48 on all BP medications. Pt came into office today for repeat BP check 107/70 was BP today , but pt took medications this morning. Pt is advised to not take medication and to return on 03/23/17 for a repeat BP check.

## 2017-03-20 ENCOUNTER — Ambulatory Visit (HOSPITAL_COMMUNITY): Payer: Medicaid Other

## 2017-03-23 ENCOUNTER — Ambulatory Visit: Payer: Medicaid Other | Attending: Internal Medicine | Admitting: Physician Assistant

## 2017-03-23 ENCOUNTER — Encounter: Payer: Self-pay | Admitting: Internal Medicine

## 2017-03-23 ENCOUNTER — Ambulatory Visit (HOSPITAL_COMMUNITY): Payer: Medicaid Other

## 2017-03-23 ENCOUNTER — Ambulatory Visit (INDEPENDENT_AMBULATORY_CARE_PROVIDER_SITE_OTHER): Payer: Medicaid Other | Admitting: Internal Medicine

## 2017-03-23 VITALS — BP 134/89 | HR 89 | Temp 99.3°F | Resp 16 | Wt 223.0 lb

## 2017-03-23 VITALS — BP 126/80 | HR 82 | Ht 64.0 in | Wt 223.0 lb

## 2017-03-23 DIAGNOSIS — I5042 Chronic combined systolic (congestive) and diastolic (congestive) heart failure: Secondary | ICD-10-CM | POA: Diagnosis not present

## 2017-03-23 DIAGNOSIS — E1122 Type 2 diabetes mellitus with diabetic chronic kidney disease: Secondary | ICD-10-CM | POA: Insufficient documentation

## 2017-03-23 DIAGNOSIS — R197 Diarrhea, unspecified: Secondary | ICD-10-CM | POA: Diagnosis not present

## 2017-03-23 DIAGNOSIS — I10 Essential (primary) hypertension: Secondary | ICD-10-CM | POA: Diagnosis not present

## 2017-03-23 DIAGNOSIS — R1310 Dysphagia, unspecified: Secondary | ICD-10-CM

## 2017-03-23 DIAGNOSIS — Z794 Long term (current) use of insulin: Secondary | ICD-10-CM | POA: Insufficient documentation

## 2017-03-23 DIAGNOSIS — G8929 Other chronic pain: Secondary | ICD-10-CM | POA: Insufficient documentation

## 2017-03-23 DIAGNOSIS — E118 Type 2 diabetes mellitus with unspecified complications: Secondary | ICD-10-CM

## 2017-03-23 DIAGNOSIS — Z79899 Other long term (current) drug therapy: Secondary | ICD-10-CM | POA: Diagnosis not present

## 2017-03-23 DIAGNOSIS — Z7982 Long term (current) use of aspirin: Secondary | ICD-10-CM | POA: Diagnosis not present

## 2017-03-23 DIAGNOSIS — I481 Persistent atrial fibrillation: Secondary | ICD-10-CM | POA: Insufficient documentation

## 2017-03-23 DIAGNOSIS — M069 Rheumatoid arthritis, unspecified: Secondary | ICD-10-CM | POA: Diagnosis not present

## 2017-03-23 DIAGNOSIS — I2510 Atherosclerotic heart disease of native coronary artery without angina pectoris: Secondary | ICD-10-CM | POA: Diagnosis not present

## 2017-03-23 DIAGNOSIS — N183 Chronic kidney disease, stage 3 (moderate): Secondary | ICD-10-CM | POA: Diagnosis not present

## 2017-03-23 DIAGNOSIS — K219 Gastro-esophageal reflux disease without esophagitis: Secondary | ICD-10-CM | POA: Insufficient documentation

## 2017-03-23 DIAGNOSIS — I130 Hypertensive heart and chronic kidney disease with heart failure and stage 1 through stage 4 chronic kidney disease, or unspecified chronic kidney disease: Secondary | ICD-10-CM | POA: Insufficient documentation

## 2017-03-23 DIAGNOSIS — Z7952 Long term (current) use of systemic steroids: Secondary | ICD-10-CM | POA: Insufficient documentation

## 2017-03-23 LAB — GLUCOSE, POCT (MANUAL RESULT ENTRY): POC Glucose: 179 mg/dl — AB (ref 70–99)

## 2017-03-23 NOTE — Patient Instructions (Addendum)
Check blood pressure at least 3 times per week and record and bring to your next visit. Start out taking 1/2 of your 100mg  Losartan daily then increase dose ater the first few days if blood pressure isn't at goal  Goal<130/85

## 2017-03-23 NOTE — Progress Notes (Signed)
HISTORY OF PRESENT ILLNESS:  Marco [name] is a 61 y.o. male with multiple multiple significant medical problems including a history of poorly controlled diabetes mellitus, chronic pain syndrome on chronic narcotics, GERD, chronic chest pain syndrome, intermittent problems with diarrhea, and prior right hemicolectomy elsewhere for what is believed to be large precancerous polyp. Patient was seen recently by the GI physician assistant regarding dysphagia with globus type sensation. Liquids and solids. Choking type sensation without coughing. He underwent EGD for dysphagia in September 2016. The examination was normal. Empiric dilation carried out. Reported some transient relief. Surveillance colonoscopy at that time revealed diminutive adenomas and benign colorectal mucosa to evaluate diarrhea. Follow-up in 5 years recommended. Terms of his recent dysphagia evaluation he underwent Henrene Pastor M esophagram. This was normal. He also underwent modified barium swallow with speech pathology. This was unremarkable. Functional dysphagia suspected. Patient denies weight loss or vomiting. He continues on chronic narcotics. He continues with intermittent loose stools with urgency. He is on metformin  REVIEW OF SYSTEMS:  All non-GI ROS negative except for arthritis, back pain, visual change, depression, fatigue, muscle cramps, headaches, sleeping problems, ankle swelling, excessive urination, urinary leakage, shortness of breath  Past Medical History:  Diagnosis Date  . Benign colon polyp 08/01/2013   [facility] in [place]. large base tranverse colon polyp was biopsied. polyp was benign with minimal surface hyperplastic change.  . Chronic combined systolic and diastolic CHF (congestive heart failure) ([place])   . Chronic pain   . CKD (chronic kidney disease), stage III   . Diabetes mellitus without complication ([place])   . Diverticulosis   . Esophageal hiatal hernia 07/29/2013   confirmed on EGD   .  Esophageal stricture   . Essential hypertension   . Gastritis 07/29/2013   confirmed on EGD, bx done an negative for intestinal metaplasia, dsyplasia or H. pylori. normal gastric emptying study done 07/13/2013.  [name] GERD (gastroesophageal reflux disease)   . Morbid obesity ([place])   . Non-obstructive CAD    a. 02/2013 Cath ([place]): nonobs dzs;  b. 09/2014 Myoview ([place]): EF 55%, no ischemia;  c. 12/2014 Echo: EF 50-55%, gr1 DD, no effusion.  . Persistent atrial fibrillation ([place])    a. 02/2013 s/p rfca in [place], [place]-->prev on Xarelto, d/c'd 2/2 anemia, ? GIB.  [name] Rheumatoid arthritis ([place])   . Sigmoid diverticulosis 08/01/2013   confirmed on colonscopy. record scanned into chart    Past Surgical History:  Procedure Laterality Date  . CARDIAC CATHETERIZATION  05/2014   ablation for atrial fibrillation  . CARDIAC CATHETERIZATION N/A 11/16/2015   Procedure: Left Heart Cath and Coronary Angiography;  Surgeon: Leonie Man, MD;  Location: [facility];  Service: Cardiovascular;  Laterality: N/A;  . COLON RESECTION  09/2013   due to large, abnormal polpy. non cancerous per patient.   . COLON SURGERY  09/2014   colon resection     Social History Marco [name]  reports that he quit smoking about 8 years ago. His smoking use included Cigarettes. He has a 5.00 pack-year smoking history. He has never used smokeless tobacco. He reports that he does not drink alcohol or use drugs.  family history includes COPD in his father and mother; Diabetes in his father and mother; Heart Problems in his father; Hypertension in his father and mother; Lung cancer in his mother.  No Known Allergies     PHYSICAL EXAMINATION: Vital signs: BP 126/80   Pulse 82   Ht 5\' 4"  (  1.626 m)   Wt 223 lb (101.2 kg)   BMI 38.28 kg/m  General: Well-developed, well-nourished, no acute distress HEENT: Sclerae are anicteric, conjunctiva pink. Oral mucosa intact Lungs: Clear Heart: Regular Abdomen: soft, obese, nontender,  nondistended, no obvious ascites, no peritoneal signs, normal bowel sounds. No organomegaly. Extremities: No clubbing cyanosis or edema Psychiatric: alert and oriented x3. Cooperative   ASSESSMENT:  #1. Dysphagia has described. Suspect dysmotility secondary to chronic narcotics. Negative workup including prior endoscopy with dilation, esophagram, and modified barium swallow with speech pathology evaluation #2. History of adenomatous colon polyps. Last colonoscopy 2016 #3. Status post right hemicolectomy elsewhere for presumed advanced colon polyp #4. Intermittent problems with diarrhea and urgency likely secondary to metformin and altered colonic anatomy   PLAN:  #1. Discussed narcotics and GI gut dysmotility. He does not feel that he can stop his narcotics #2. Discussed metformin as a possible causative agent for diarrhea and urgency. He can discuss this with his PCP #3. Over-the-counter antidiarrheals as directed would be okay #4. Surveillance colonoscopy around 2021 as planned #5. Interval follow-up as needed #6. Return to the care of PCP  15 minutes spent face-to-face with the patient. Greater than 50% a time use for counseling regarding his chronic swallowing complaints and intermittent problems with diarrhea and urgency

## 2017-03-23 NOTE — Patient Instructions (Signed)
Please follow up as needed 

## 2017-03-23 NOTE — Progress Notes (Signed)
Pt here for BP check. F/u hypotension.Marco [name] Has not taken BP medication since Thursday.

## 2017-03-23 NOTE — Progress Notes (Signed)
Patient ID: Marco [name], male   DOB: September 15, 1955, 61 y.o.   MRN: [id number]   Marco [name], is a 61 y.o. male  SWF:[id number]  [id number]  DOB - 06/30/1956  Subjective:  Chief Complaint and HPI: Marco [name] is a 61 y.o. male here today for BP issues.  He was previously taken off all of his BP meds due to hypotension.  Today BP is high.  He denies CP/SOB/HA/Vision changes/dizziness.  He is feeling good today  ROS:   Constitutional:  No f/c, No night sweats, No unexplained weight loss. EENT:  No vision changes, No blurry vision, No hearing changes. No mouth, throat, or ear problems.  Respiratory: No cough, No SOB Cardiac: No CP, no palpitations GI:  No abd pain, No N/V/D. GU: No Urinary s/sx Musculoskeletal: No joint pain Neuro: No headache, no dizziness, no motor weakness.  Skin: No rash Endocrine:  No polydipsia. No polyuria.  Psych: Denies SI/HI  No problems updated.  ALLERGIES: No Known Allergies  PAST MEDICAL HISTORY: Past Medical History:  Diagnosis Date  . Benign colon polyp 08/01/2013   [facility] in [place]. large base tranverse colon polyp was biopsied. polyp was benign with minimal surface hyperplastic change.  . Chronic combined systolic and diastolic CHF (congestive heart failure) ([place])   . Chronic pain   . CKD (chronic kidney disease), stage III   . Diabetes mellitus without complication ([place])   . Diverticulosis   . Esophageal hiatal hernia 07/29/2013   confirmed on EGD   . Esophageal stricture   . Essential hypertension   . Gastritis 07/29/2013   confirmed on EGD, bx done an negative for intestinal metaplasia, dsyplasia or H. pylori. normal gastric emptying study done 07/13/2013.  [name] GERD (gastroesophageal reflux disease)   . Morbid obesity ([place])   . Non-obstructive CAD    a. 02/2013 Cath ([place]): nonobs dzs;  b. 09/2014 Myoview ([place]): EF 55%, no ischemia;  c. 12/2014 Echo: EF 50-55%, gr1 DD, no effusion.  . Persistent atrial fibrillation  ([place])    a. 02/2013 s/p rfca in [place], [place]-->prev on Xarelto, d/c'd 2/2 anemia, ? GIB.  [name] Rheumatoid arthritis ([place])   . Sigmoid diverticulosis 08/01/2013   confirmed on colonscopy. record scanned into chart    MEDICATIONS AT HOME: Prior to Admission medications   Medication Sig Start Date End Date Taking? Authorizing Provider  albuterol (PROVENTIL HFA;VENTOLIN HFA) 108 (90 Base) MCG/ACT inhaler Inhale 2 puffs into the lungs every 6 (six) hours as needed for wheezing or shortness of breath. 09/29/16  Yes Funches, Adriana Mccallum, MD  aspirin (EQ ASPIRIN ADULT LOW DOSE) 81 MG EC tablet Take 1 tablet (81 mg total) by mouth daily. Swallow whole. 02/05/17  Yes Funches, Josalyn, MD  atorvastatin (LIPITOR) 40 MG tablet Take 1 tablet (40 mg total) by mouth daily. 02/05/17  Yes Funches, Josalyn, MD  BD PEN NEEDLE NANO U/F 32G X 4 MM MISC USE ONE PEN NEEDLE THREE TIMES DAILY 08/25/16  Yes Jegede, Olugbemiga E, MD  Blood Pressure KIT 1 each by Does not apply route 2 (two) times daily. 09/09/16  Yes Funches, Josalyn, MD  furosemide (LASIX) 40 MG tablet Take 1 tablet by mouth every morning Patient taking differently: Take 40 mg by mouth every morning. Take 1 tablet by mouth every morning 02/05/17  Yes Funches, Josalyn, MD  HYDROcodone-acetaminophen (NORCO/VICODIN) 5-325 MG tablet Take 1 tablet by mouth every 8 (eight) hours as needed for moderate pain. Per pain management   Yes [provider]  insulin aspart (NOVOLOG FLEXPEN) 100 UNIT/ML FlexPen Inject 14-24 Units into the skin 3 (three) times daily with meals. 02/05/17  Yes Funches, Josalyn, MD  Insulin Detemir (LEVEMIR FLEXTOUCH) 100 UNIT/ML Pen Inject 50 Units into the skin daily at 10 pm. 02/05/17  Yes Funches, Josalyn, MD  leflunomide (ARAVA) 10 MG tablet Take 10 mg by mouth daily. 02/23/17  Yes [provider]  metFORMIN (GLUCOPHAGE-XR) 500 MG 24 hr tablet Take 2 tablets (1,000 mg total) by mouth 2 (two) times daily. 02/05/17  Yes Funches,  Adriana Mccallum, MD  predniSONE (DELTASONE) 10 MG tablet Take 1 tablet (10 mg total) by mouth daily with breakfast. 02/05/17  Yes Funches, Josalyn, MD  pregabalin (LYRICA) 100 MG capsule Take 1 capsule (100 mg total) by mouth 3 (three) times daily. 02/05/17  Yes Funches, Josalyn, MD  ranitidine (ZANTAC) 150 MG tablet Take 1 tablet (150 mg total) by mouth at bedtime. Patient taking differently: Take 150 mg by mouth daily before breakfast.  02/05/17  Yes Funches, Josalyn, MD  Secukinumab (COSENTYX) 150 MG/ML SOSY Inject 150 mg into the skin every 28 (twenty-eight) days.  06/02/16  Yes [provider]  ACCU-CHEK AVIVA PLUS test strip USE ONE STRIP TO CHECK BLOOD GLUCOSE 3 TIMES DAILY 07/30/16   Boykin Nearing, MD  ACCU-CHEK SOFTCLIX LANCETS lancets USE AS DIRECTED 10/22/16   Funches, Adriana Mccallum, MD  ACCU-CHEK SOFTCLIX LANCETS lancets 1 each by Other route 3 (three) times daily. Use as instructed 10/21/16   Boykin Nearing, MD  Blood Glucose Monitoring Suppl (ACCU-CHEK AVIVA PLUS) w/Device KIT 1 Device by Does not apply route 3 (three) times daily after meals. E11.9 02/05/17   Funches, Adriana Mccallum, MD  Eszopiclone 3 MG TABS Take 1 tablet (3 mg total) by mouth at bedtime. Take immediately before bedtime Patient not taking: Reported on 03/16/2017 02/05/17   Boykin Nearing, MD  fluorometholone (FML) 0.1 % ophthalmic suspension Place 1 drop into both eyes 3 (three) times daily.    [provider]  glucose blood (ACCU-CHEK AVIVA PLUS) test strip 1 each by Other route 3 (three) times daily. E11.9 02/05/17   Funches, Adriana Mccallum, MD  lidocaine (XYLOCAINE) 5 % ointment Apply 1 application topically 4 (four) times daily as needed. Apply to feet Patient taking differently: Apply 1 application topically 4 (four) times daily as needed (for foot pain).  08/19/16   Funches, Adriana Mccallum, MD  losartan (COZAAR) 100 MG tablet Take 1 tablet (100 mg total) by mouth daily. 02/05/17   Funches, Adriana Mccallum, MD  vitamin B-12 (CYANOCOBALAMIN)  1000 MCG tablet Take 1 tablet (1,000 mcg total) by mouth daily. Patient not taking: Reported on 03/09/2017 10/21/16   Boykin Nearing, MD     Objective:  EXAM:   Vitals:   03/23/17 0939 03/23/17 0951  BP: (!) 149/93 134/89  Pulse: 89   Resp: 16   Temp: 99.3 F (37.4 C)   TempSrc: Oral   SpO2: 96%   Weight: 223 lb (101.2 kg)     General appearance : A&OX3. NAD. Non-toxic-appearing HEENT: Atraumatic and Normocephalic.  PERRLA. EOM intact. Neck: supple, no JVD. No cervical lymphadenopathy. No thyromegaly Chest/Lungs:  Breathing-non-labored, Good air entry bilaterally, breath sounds normal without rales, rhonchi, or wheezing  CVS: S1 S2 regular, no murmurs, gallops, rubs  Extremities: Bilateral Lower Ext shows no edema, both legs are warm to touch with = pulse throughout Neurology:  CN II-XII grossly intact, Non focal.   Psych:  TP linear. J/I WNL. Normal speech. Appropriate eye contact and  affect.  Skin:  No Rash  Data Review Lab Results  Component Value Date   HGBA1C 7.6 03/05/2017   HGBA1C 6.9 12/04/2016   HGBA1C 7.5 (H) 09/02/2016     Assessment & Plan   1. Essential hypertension Was previously having hypotension and all meds were stopped.  Today, BP 149/93 Restart Losartan only-he can 1/2 the '100mg'$  tablets.  Goal<130/85.  If not at goal after 5-7 days, he will increase to a whole '100mg'$  tablet of Losartan. Check BP OOO 3-5 times/week and record and bring to next visit.  Anticipate Losartan may help GFR.  Discussed with Theda Sers. - Basic metabolic panel  2. Diabetes mellitus with complication ([place]) Uncontrolled-continue current regimen and work on diet. - Glucose (CBG) - Basic metabolic panel   Patient have been counseled extensively about nutrition and exercise  Return in about 2 weeks (around 04/06/2017) for appointment with stacey hammer for BP visit and keep sept. appt with Dr Wynetta Emery..  The patient was given clear instructions to go to ER or return to  medical center if symptoms don't improve, worsen or new problems develop. The patient verbalized understanding. The patient was told to call to get lab results if they haven't heard anything in the next week.     Freeman Caldron, [name] [facility] [place], [place]   03/23/2017, 10:14 AM

## 2017-03-24 LAB — BASIC METABOLIC PANEL
BUN/Creatinine Ratio: 13 (ref 10–24)
BUN: 20 mg/dL (ref 8–27)
CALCIUM: 9.2 mg/dL (ref 8.6–10.2)
CO2: 22 mmol/L (ref 20–29)
CREATININE: 1.6 mg/dL — AB (ref 0.76–1.27)
Chloride: 100 mmol/L (ref 96–106)
GFR calc Af Amer: 53 mL/min/{1.73_m2} — ABNORMAL LOW (ref >59)
GFR, EST NON AFRICAN AMERICAN: 45 mL/min/{1.73_m2} — AB (ref >59)
Glucose: 150 mg/dL — ABNORMAL HIGH (ref 65–99)
POTASSIUM: 3.5 mmol/L (ref 3.5–5.2)
Sodium: 147 mmol/L — ABNORMAL HIGH (ref 134–144)

## 2017-03-25 ENCOUNTER — Ambulatory Visit (HOSPITAL_COMMUNITY): Payer: Medicaid Other

## 2017-03-25 NOTE — Telephone Encounter (Signed)
Patient agrees to [employer].  Prior to ordering Lexiscan, sending to [name] for confirmation. Patient had myoview 01/01/17.  To [name].

## 2017-03-26 NOTE — Telephone Encounter (Signed)
Please find out if he has had any further CP

## 2017-03-26 NOTE — Telephone Encounter (Signed)
Attempted to call patient - received automated message that VM is not set up yet. Unable to leave message. Will try again later. 

## 2017-03-30 ENCOUNTER — Ambulatory Visit (HOSPITAL_COMMUNITY): Payer: Medicaid Other

## 2017-03-30 NOTE — Telephone Encounter (Signed)
Patient reports no CP or SOB.

## 2017-03-31 NOTE — Telephone Encounter (Signed)
Attempted to call patient to confirm he is OK for Cardiac Rehab with no further testing.  Received automated message that VM has not been set up yet - unable to leave message. Will try again later.

## 2017-03-31 NOTE — Telephone Encounter (Signed)
Ok to go back to rehab

## 2017-04-01 ENCOUNTER — Ambulatory Visit (HOSPITAL_COMMUNITY): Payer: Medicaid Other

## 2017-04-01 ENCOUNTER — Encounter: Payer: Self-pay | Admitting: Pharmacist

## 2017-04-01 NOTE — Progress Notes (Signed)
Received prior authorization request from [facility] Medicaid for Lunesta (eszopiclone). Requested approved #[id number]

## 2017-04-03 ENCOUNTER — Other Ambulatory Visit: Payer: Self-pay | Admitting: Pharmacist

## 2017-04-03 ENCOUNTER — Ambulatory Visit (HOSPITAL_COMMUNITY): Payer: Medicaid Other

## 2017-04-03 MED ORDER — ACCU-CHEK FASTCLIX LANCETS MISC: 11 refills | Status: DC

## 2017-04-06 ENCOUNTER — Ambulatory Visit (HOSPITAL_COMMUNITY): Payer: Medicaid Other

## 2017-04-07 ENCOUNTER — Encounter: Payer: Medicaid Other | Admitting: Pharmacist

## 2017-04-07 ENCOUNTER — Ambulatory Visit (INDEPENDENT_AMBULATORY_CARE_PROVIDER_SITE_OTHER): Payer: Medicaid Other | Admitting: Cardiology

## 2017-04-07 ENCOUNTER — Encounter: Payer: Self-pay | Admitting: Cardiology

## 2017-04-07 VITALS — BP 128/74 | HR 86 | Ht 64.0 in | Wt 223.0 lb

## 2017-04-07 DIAGNOSIS — I2510 Atherosclerotic heart disease of native coronary artery without angina pectoris: Secondary | ICD-10-CM

## 2017-04-07 DIAGNOSIS — E7800 Pure hypercholesterolemia, unspecified: Secondary | ICD-10-CM

## 2017-04-07 DIAGNOSIS — I4819 Other persistent atrial fibrillation: Secondary | ICD-10-CM

## 2017-04-07 DIAGNOSIS — I5042 Chronic combined systolic (congestive) and diastolic (congestive) heart failure: Secondary | ICD-10-CM | POA: Diagnosis not present

## 2017-04-07 DIAGNOSIS — I10 Essential (primary) hypertension: Secondary | ICD-10-CM

## 2017-04-07 DIAGNOSIS — I481 Persistent atrial fibrillation: Secondary | ICD-10-CM | POA: Diagnosis not present

## 2017-04-07 NOTE — Patient Instructions (Signed)
Medication Instructions:  Your provider recommends that you continue on your current medications as directed. Please refer to the Current Medication list given to you today.    Labwork: Your provider recommends that you return for FASTING lab work.  Testing/Procedures: None  Follow-Up: Your provider wants you to follow-up in: 6 months with Dr. Theodosia Blender assistant. You will receive a reminder letter in the mail two months in advance. If you don't receive a letter, please call our office to schedule the follow-up appointment.    Your provider wants you to follow-up in: 1 year with [name]. You will receive a reminder letter in the mail two months in advance. If you don't receive a letter, please call our office to schedule the follow-up appointment.    Any Other Special Instructions Will Be Listed Below (If Applicable).     If you need a refill on your cardiac medications before your next appointment, please call your pharmacy.

## 2017-04-07 NOTE — Progress Notes (Signed)
Cardiology Office Note:    Date:  04/07/2017   ID:  Marco [name], DOB 1956/08/10, MRN [id number]  PCP:  Boykin Nearing, MD  Cardiologist:  [name], MD   Referring MD: Boykin Nearing, MD   Chief Complaint  Patient presents with  . Coronary Artery Disease  . Hypertension  . Shortness of Breath  . Hyperlipidemia    History of Present Illness:    Marco [name] is a 61 y.o. male with a hx of DM, HTN, non-obstructive CAD by cath 2017, persistent atrial fibrillation (s/p RFCA in [place] 2017, Xarelto previousy d/c'd 2/2 anemia and unknown source of blood loss), chronic combined CHF (EF 45-50% by echo 08/2016), morbid obesity, OSA (incomplete compliance with CPAP due to intolerance), CKD III per labs.  2D echo 09/03/16 showed EF 45-50% with global HK, grade 2 DD, elevated LV filling pressure. CTA 09/02/16 negative for PE.  Nuclear stress test was done for atypical CP 01/01/17 showing small defect of severe severity in apical septal and apex location, no ischemia, EF 44% - suspected artifact. Limited echo 01/20/17 showed EF 50-55%, hypokinesis of apical inferoseptal myocardium, grade 1 diastolic dysfunction, mild MR, no sig change from prior. He was seen by [name], PA 01/2017 and reported continued exertional SOB for the past year. He did not notice any significant change with diuretic adjustment but denied  further chest pain.  He no longer exercises due to dyspnea and is very sedentary. He did smoke for 20 years; quit in 2009. He was referred to cardiac rehab due to possibility that his symptoms were related to deconditioning as well as referred back to his pulmonary MD due to abnormal PFTs with possible interstitial lung problem. He did not appear to be in HF on that exam. Hbg was normal at 12.4.    Today he is back for followup.    Past Medical History:  Diagnosis Date  . Benign colon polyp 08/01/2013   [facility] in [place]. large base tranverse colon polyp was biopsied. polyp  was benign with minimal surface hyperplastic change.  . Chronic combined systolic and diastolic CHF (congestive heart failure) ([place])   . Chronic pain   . CKD (chronic kidney disease), stage III   . Diabetes mellitus without complication ([place])   . Diverticulosis   . Esophageal hiatal hernia 07/29/2013   confirmed on EGD   . Esophageal stricture   . Essential hypertension   . Gastritis 07/29/2013   confirmed on EGD, bx done an negative for intestinal metaplasia, dsyplasia or H. pylori. normal gastric emptying study done 07/13/2013.  [name] GERD (gastroesophageal reflux disease)   . Morbid obesity ([place])   . Non-obstructive CAD    a. 02/2013 Cath ([place]): nonobs dzs;  b. 09/2014 Myoview ([place]): EF 55%, no ischemia;  c. 12/2014 Echo: EF 50-55%, gr1 DD, no effusion.  . Persistent atrial fibrillation ([place])    a. 02/2013 s/p rfca in [place], [place]-->prev on Xarelto, d/c'd 2/2 anemia, ? GIB.  [name] Rheumatoid arthritis ([place])   . Sigmoid diverticulosis 08/01/2013   confirmed on colonscopy. record scanned into chart    Past Surgical History:  Procedure Laterality Date  . CARDIAC CATHETERIZATION  05/2014   ablation for atrial fibrillation  . CARDIAC CATHETERIZATION N/A 11/16/2015   Procedure: Left Heart Cath and Coronary Angiography;  Surgeon: Leonie Man, MD;  Location: [facility];  Service: Cardiovascular;  Laterality: N/A;  . COLON RESECTION  09/2013   due to large, abnormal polpy.  non cancerous per patient.   . COLON SURGERY  09/2014   colon resection     Current Medications: Current Meds  Medication Sig  . ACCU-CHEK AVIVA PLUS test strip USE ONE STRIP TO CHECK BLOOD GLUCOSE 3 TIMES DAILY  . ACCU-CHEK FASTCLIX LANCETS MISC Use a directed to check blood sugar 3 times daily.  [name] albuterol (PROVENTIL HFA;VENTOLIN HFA) 108 (90 Base) MCG/ACT inhaler Inhale 2 puffs into the lungs every 6 (six) hours as needed for wheezing or shortness of breath.  [name] aspirin (EQ ASPIRIN ADULT LOW DOSE) 81 MG EC tablet  Take 1 tablet (81 mg total) by mouth daily. Swallow whole.  [name] atorvastatin (LIPITOR) 40 MG tablet Take 1 tablet (40 mg total) by mouth daily.  . BD PEN NEEDLE NANO U/F 32G X 4 MM MISC USE ONE PEN NEEDLE THREE TIMES DAILY  . Blood Glucose Monitoring Suppl (ACCU-CHEK AVIVA PLUS) w/Device KIT 1 Device by Does not apply route 3 (three) times daily after meals. E11.9  . Blood Pressure KIT 1 each by Does not apply route 2 (two) times daily.  . Eszopiclone 3 MG TABS Take 1 tablet (3 mg total) by mouth at bedtime. Take immediately before bedtime  . fluorometholone (FML) 0.1 % ophthalmic suspension Place 1 drop into both eyes 3 (three) times daily.  . furosemide (LASIX) 40 MG tablet Take 1 tablet by mouth every morning (Patient taking differently: Take 40 mg by mouth every morning. Take 1 tablet by mouth every morning)  . glucose blood (ACCU-CHEK AVIVA PLUS) test strip 1 each by Other route 3 (three) times daily. E11.9  . HYDROcodone-acetaminophen (NORCO/VICODIN) 5-325 MG tablet Take 1 tablet by mouth every 8 (eight) hours as needed for moderate pain. Per pain management  . insulin aspart (NOVOLOG FLEXPEN) 100 UNIT/ML FlexPen Inject 14-24 Units into the skin 3 (three) times daily with meals.  . Insulin Detemir (LEVEMIR FLEXTOUCH) 100 UNIT/ML Pen Inject 50 Units into the skin daily at 10 pm.  . leflunomide (ARAVA) 10 MG tablet Take 10 mg by mouth daily.  [name] lidocaine (XYLOCAINE) 5 % ointment Apply 1 application topically 4 (four) times daily as needed. Apply to feet (Patient taking differently: Apply 1 application topically 4 (four) times daily as needed (for foot pain). )  . metFORMIN (GLUCOPHAGE-XR) 500 MG 24 hr tablet Take 2 tablets (1,000 mg total) by mouth 2 (two) times daily.  . predniSONE (DELTASONE) 10 MG tablet Take 1 tablet (10 mg total) by mouth daily with breakfast.  . pregabalin (LYRICA) 100 MG capsule Take 1 capsule (100 mg total) by mouth 3 (three) times daily.  . ranitidine (ZANTAC) 150 MG  tablet Take 1 tablet (150 mg total) by mouth at bedtime. (Patient taking differently: Take 150 mg by mouth daily before breakfast. )  . Secukinumab (COSENTYX) 150 MG/ML SOSY Inject 150 mg into the skin every 28 (twenty-eight) days.      Allergies:   Patient has no known allergies.   Social History   Social History  . Marital status: Married    Spouse name: Amethyst  . Number of children: 8  . Years of education: 46   Occupational History  .      disabled   Social History Main Topics  . Smoking status: Former Smoker    Packs/day: 0.50    Years: 10.00    Types: Cigarettes    Quit date: 04/11/2008  . Smokeless tobacco: Never Used  . Alcohol use No  . Drug use: No  .  Sexual activity: Yes    Partners: Female    Birth control/ protection: None   Other Topics Concern  . None   Social History Narrative   Lives with wife. Does not work.  On disability.    Caffeine- coffee, 1/2 cup daily     Family History: The patient's family history includes COPD in his father and mother; Diabetes in his father and mother; Heart Problems in his father; Hypertension in his father and mother; Lung cancer in his mother. There is no history of Colon cancer or Stomach cancer.  ROS:   Please see the history of present illness.     All other systems reviewed and are negative.  EKGs/Labs/Other Studies Reviewed:    The following studies were reviewed today: none  EKG:  EKG is not ordered today.    Recent Labs: 09/03/2016: ALT 35 12/04/2016: BNP 57.6 01/01/2017: NT-Pro BNP 180 03/05/2017: Hemoglobin 12.4; Platelets 179 03/23/2017: BUN 20; Creatinine, Ser 1.60; Potassium 3.5; Sodium 147   Recent Lipid Panel    Component Value Date/Time   CHOL 92 (L) 06/10/2016 1253   TRIG 91 06/10/2016 1253   HDL 35 (L) 06/10/2016 1253   CHOLHDL 2.6 06/10/2016 1253   VLDL 18 06/10/2016 1253   LDLCALC 39 06/10/2016 1253    Physical Exam:    VS:  BP 128/74   Pulse 86   Ht 5' 4" (1.626 m)   Wt 223 lb  (101.2 kg)   SpO2 94%   BMI 38.28 kg/m     Wt Readings from Last 3 Encounters:  04/07/17 223 lb (101.2 kg)  03/23/17 223 lb (101.2 kg)  03/23/17 223 lb (101.2 kg)     GEN:  Well nourished, well developed in no acute distress HEENT: Normal NECK: No JVD; No carotid bruits LYMPHATICS: No lymphadenopathy CARDIAC: RRR, no murmurs, rubs, gallops RESPIRATORY:  Clear to auscultation without rales, wheezing or rhonchi  ABDOMEN: Soft, non-tender, non-distended MUSCULOSKELETAL:  No edema; No deformity  SKIN: Warm and dry NEUROLOGIC:  Alert and oriented x 3 PSYCHIATRIC:  Normal affect   ASSESSMENT:    1. Coronary artery disease involving native coronary artery of native heart without angina pectoris   2. Chronic combined systolic and diastolic CHF (congestive heart failure) ([place])   3. Essential hypertension   4. Persistent atrial fibrillation ([place])   5. Pure hypercholesterolemia    PLAN:    In order of problems listed above:  1. ASCAD nonobstructive by cath with luminal irregularities up to 35% in LAD. He continues to have chest pain that has not changed.  He describes it as a sharp pain that comes and goes.  Likely related to MSK etiology.   It is reproducible with deep breathing.  He has been having a lot of indigestion and bubblying feeling in his stomach.  He saw GI and had a barium swallow which was normal with no GERD.  I do not think his symptoms are related to coronary ischemia as cath a year ago showed minimal dx and nuclear stress test showed no ischemia.   He will continue on statin and ASA.  2. Chronic combined systolic/diastolic CHF - his last EF was 50-55% with grade 1 DD. He appears euvolemic on exam today and weight is stable.  He will continue on lasix.  No indication for ARB as EF >50%.    3.   HTN - BP is well controlled on exam today.    4.   Persistent atrial fibrillation s/p RFCA  in [place] 2017- he is maintaining NSR on exam today. He was on Xarelto in the past but  stopped due to anemia.  I will have him see GI to make sure we are ok with restarting his NOAC as his CHADS2VASC score is 4.  5.   Hyperlipidemia with LDL < 70.  He will continue on atorvastatin 42m daily.  I will check an FLP and ALT.   6.  Chronic DOE - likely multifactorial from obesity, deconditioning with sedentary state, CHF (this appears stable) and possible interstitial lung disease by abnormal PFTs.  He was referred back to pulmonary but his appt is not until Nov so I will try to get that scheduled sooner.  His DOE is the same and remains unchanged. He had a cardiopulmonary stress test done last fall but could not finish it due to complaints that is was uncomfortable and did not like the equipment he was connected to.   Event monitor showed occasional PVC's.    Medication Adjustments/Labs and Tests Ordered: Current medicines are reviewed at length with the patient today.  Concerns regarding medicines are outlined above.  No orders of the defined types were placed in this encounter.  No orders of the defined types were placed in this encounter.   Signed, TFransico Him MD  04/07/2017 1:21 PM    [facility]

## 2017-04-07 NOTE — Telephone Encounter (Signed)
Patient was seen in clinic today. He understands Cardiac Rehab will call to arrange sessions. He was grateful for call.

## 2017-04-08 ENCOUNTER — Ambulatory Visit (HOSPITAL_COMMUNITY): Payer: Medicaid Other

## 2017-04-08 ENCOUNTER — Ambulatory Visit: Payer: Medicaid Other | Attending: Internal Medicine | Admitting: Pharmacist

## 2017-04-08 VITALS — BP 128/85 | HR 72

## 2017-04-08 DIAGNOSIS — I10 Essential (primary) hypertension: Secondary | ICD-10-CM | POA: Insufficient documentation

## 2017-04-08 NOTE — Patient Instructions (Addendum)
Thank you for coming to see [place]!  Continue current medications as prescribed  Follow up with Dr. Wynetta Emery as directed

## 2017-04-08 NOTE — Progress Notes (Signed)
   S:    Patient arrives in good spirits.    Presents to the clinic for hypertension evaluation. Patient was referred on 03/23/17 by [place] Caldron, PA.  Patient was last seen by Primary Care Provider on 03/23/17.   He was seen by cardiology yesterday and his blood pressure was good (128/74).  Patient reports adherence with medications.  Current BP Medications include:  Losartan 50 mg daily  Antihypertensives tried in the past include:  Carvedilol, Bidil (stopped due to hypotension)  Home blood pressures: 110-140s/80s. They are taken at different times during the day.  O:   Last 3 Office BP readings: BP Readings from Last 3 Encounters:  04/08/17 128/85  04/07/17 128/74  03/23/17 126/80    BMET    Component Value Date/Time   NA 147 (H) 03/23/2017 1120   K 3.5 03/23/2017 1120   CL 100 03/23/2017 1120   CO2 22 03/23/2017 1120   GLUCOSE 150 (H) 03/23/2017 1120   GLUCOSE 211 (H) 03/05/2017 1745   BUN 20 03/23/2017 1120   CREATININE 1.60 (H) 03/23/2017 1120   CREATININE 1.42 (H) 09/09/2016 1037   CALCIUM 9.2 03/23/2017 1120   GFRNONAA 45 (L) 03/23/2017 1120   GFRNONAA 53 (L) 09/09/2016 1037   GFRAA 53 (L) 03/23/2017 1120   GFRAA 61 09/09/2016 1037    A/P: Hypertension longstanding with recent hypotension currently 128/85 on current medications. Continue current medications as prescribed. Losartan 50 mg daily was added to med list as patient has been taking that as instructed by [facility] Caldron. Patient to see Dr. Wynetta Emery next week and medication can be titrated if needed.  Results reviewed and written information provided.   Total time in face-to-face counseling 10 minutes.   F/U Clinic Visit with Dr. Wynetta Emery as directed.  Patient seen with Waverly Ferrari, PharmD Candidate

## 2017-04-10 ENCOUNTER — Ambulatory Visit (HOSPITAL_COMMUNITY): Payer: Medicaid Other

## 2017-04-14 ENCOUNTER — Telehealth: Payer: Self-pay | Admitting: Cardiology

## 2017-04-14 NOTE — Telephone Encounter (Signed)
There is no documentation of anyone calling patient since OV with [name]. Attempted to call patient, but received automated message that VM has not been set up yet.  Will route to Cardiac Rehab to see if they tried to call to arrange sessions.

## 2017-04-14 NOTE — Telephone Encounter (Signed)
Marco [name] is returning a call . Thanks

## 2017-04-14 NOTE — Telephone Encounter (Signed)
Hello Marco [name] our scheduler has Marco [name] chart on her desk. She should be giving her a call tomorrow when she returns to work. Thanks for the referral!

## 2017-04-15 ENCOUNTER — Ambulatory Visit (HOSPITAL_COMMUNITY): Payer: Medicaid Other

## 2017-04-16 ENCOUNTER — Telehealth (HOSPITAL_COMMUNITY): Payer: Self-pay | Admitting: Pharmacy Technician

## 2017-04-16 NOTE — Telephone Encounter (Signed)
Cardiac Rehab Medication Review by a Pharmacist  Does the patient  feel that his/her medications are working for him/her?  yes  Has the patient been experiencing any side effects to the medications prescribed?  no  Does the patient measure his/her own blood pressure or blood glucose at home?  yes   Does the patient have any problems obtaining medications due to transportation or finances?   no  Understanding of regimen: excellent Understanding of indications: good Potential of compliance: good    Pharmacist comments: Patient has an excellent understanding of his medications and needed very little to no prompting.  He states that he is checking his BP at home currently and has no issues with his medications at this time.    Bertis Ruddy, PharmD Pharmacy Resident Pager #: [phone] 04/16/2017 1:58 PM

## 2017-04-17 ENCOUNTER — Encounter: Payer: Self-pay | Admitting: Internal Medicine

## 2017-04-17 ENCOUNTER — Ambulatory Visit: Payer: Medicaid Other | Attending: Internal Medicine | Admitting: Internal Medicine

## 2017-04-17 ENCOUNTER — Other Ambulatory Visit: Payer: Medicaid Other | Admitting: *Deleted

## 2017-04-17 ENCOUNTER — Ambulatory Visit (HOSPITAL_COMMUNITY): Payer: Medicaid Other

## 2017-04-17 VITALS — BP 116/79 | HR 81 | Temp 98.5°F | Resp 16 | Wt 223.8 lb

## 2017-04-17 DIAGNOSIS — Z87891 Personal history of nicotine dependence: Secondary | ICD-10-CM | POA: Insufficient documentation

## 2017-04-17 DIAGNOSIS — G8929 Other chronic pain: Secondary | ICD-10-CM | POA: Diagnosis not present

## 2017-04-17 DIAGNOSIS — I10 Essential (primary) hypertension: Secondary | ICD-10-CM

## 2017-04-17 DIAGNOSIS — I481 Persistent atrial fibrillation: Secondary | ICD-10-CM | POA: Diagnosis not present

## 2017-04-17 DIAGNOSIS — I130 Hypertensive heart and chronic kidney disease with heart failure and stage 1 through stage 4 chronic kidney disease, or unspecified chronic kidney disease: Secondary | ICD-10-CM | POA: Diagnosis not present

## 2017-04-17 DIAGNOSIS — Z6838 Body mass index (BMI) 38.0-38.9, adult: Secondary | ICD-10-CM | POA: Diagnosis not present

## 2017-04-17 DIAGNOSIS — E785 Hyperlipidemia, unspecified: Secondary | ICD-10-CM | POA: Insufficient documentation

## 2017-04-17 DIAGNOSIS — F329 Major depressive disorder, single episode, unspecified: Secondary | ICD-10-CM | POA: Diagnosis not present

## 2017-04-17 DIAGNOSIS — E1122 Type 2 diabetes mellitus with diabetic chronic kidney disease: Secondary | ICD-10-CM | POA: Insufficient documentation

## 2017-04-17 DIAGNOSIS — G4733 Obstructive sleep apnea (adult) (pediatric): Secondary | ICD-10-CM | POA: Insufficient documentation

## 2017-04-17 DIAGNOSIS — E119 Type 2 diabetes mellitus without complications: Secondary | ICD-10-CM

## 2017-04-17 DIAGNOSIS — Z9989 Dependence on other enabling machines and devices: Secondary | ICD-10-CM

## 2017-04-17 DIAGNOSIS — Z794 Long term (current) use of insulin: Secondary | ICD-10-CM | POA: Diagnosis not present

## 2017-04-17 DIAGNOSIS — E11319 Type 2 diabetes mellitus with unspecified diabetic retinopathy without macular edema: Secondary | ICD-10-CM | POA: Diagnosis not present

## 2017-04-17 DIAGNOSIS — M069 Rheumatoid arthritis, unspecified: Secondary | ICD-10-CM | POA: Insufficient documentation

## 2017-04-17 DIAGNOSIS — I5042 Chronic combined systolic (congestive) and diastolic (congestive) heart failure: Secondary | ICD-10-CM | POA: Diagnosis not present

## 2017-04-17 DIAGNOSIS — I2510 Atherosclerotic heart disease of native coronary artery without angina pectoris: Secondary | ICD-10-CM | POA: Diagnosis not present

## 2017-04-17 DIAGNOSIS — E669 Obesity, unspecified: Secondary | ICD-10-CM | POA: Insufficient documentation

## 2017-04-17 DIAGNOSIS — R1319 Other dysphagia: Secondary | ICD-10-CM

## 2017-04-17 DIAGNOSIS — Z79899 Other long term (current) drug therapy: Secondary | ICD-10-CM | POA: Diagnosis not present

## 2017-04-17 DIAGNOSIS — Z7982 Long term (current) use of aspirin: Secondary | ICD-10-CM | POA: Insufficient documentation

## 2017-04-17 DIAGNOSIS — N183 Chronic kidney disease, stage 3 unspecified: Secondary | ICD-10-CM

## 2017-04-17 DIAGNOSIS — R1310 Dysphagia, unspecified: Secondary | ICD-10-CM | POA: Diagnosis not present

## 2017-04-17 DIAGNOSIS — E7800 Pure hypercholesterolemia, unspecified: Secondary | ICD-10-CM

## 2017-04-17 LAB — GLUCOSE, POCT (MANUAL RESULT ENTRY): POC GLUCOSE: 151 mg/dL — AB (ref 70–99)

## 2017-04-17 NOTE — Progress Notes (Signed)
  Patient ID: Marco [name], male    DOB: 04/11/1956  MRN: [id number]  CC: re-establish and Diabetes   Subjective: Marco [name] is a 61 y.o. male who presents for chronic ds management. Last seen by PA about 1 mth ago His concerns today include:  61-year-old male with history of HTN, diabetes type 2 with neuropathy, CKD stage III, obesity, nonobstructive CAD, chronic combined CHF, OSA on CPAP.   1. CHF/HTN Pt meds (Lorsatan, Bidil, Coreg) were held 1 mth ago due to hypotension -saw PA 8/13 and only Lorsatan restarted -checking BP QOD. Has log of home BP readings: 120/79 yesterday, 176/85, 157/86, 132/87, 155/88 -taking Furosemide  2. Dysphagia: still has problems swallowing -problems gagging at times with liquids and solids. Has to partially regur to get it to go back down -saw Dr. Perry with [facility]: Reported recent work up included esophagram. This was normal. He also underwent modified barium swallow with speech pathology. This was unremarkable. Suspected dysmotility due to chronic narcotics.   -pt wanting a second opinion  3. Chronic pain.  Followed by [facility] for chronic pain in chest, back, shoulders and neck  4. He has notice that Metformin pills sometimes show up in BMs. He is on the extended release form -c/o grawling sounds in stomach all the times -no abdominal pain.  Moving bowels ok  5. DM: checking 3 x a day -less 120 before breakfast.  180 before lunch Compliant with Novolog sliding scale and , Levemir,   5. OSA on CPAP Not every night. Cluster phobic with face mask an tonge gets dry because he sleeps with mouth open  6. RA Followed by a rheumatologist in [place]  Patient Active Problem List   Diagnosis Date Noted  . Dysphagia 01/09/2017  . Hyperlipidemia 12/23/2016  . Chronic combined systolic and diastolic CHF (congestive heart failure) (HCC) 09/09/2016  . Diabetes mellitus with complication (HCC)   . Diabetic retinopathy (HCC)  07/22/2016  . Diabetic polyneuropathy associated with type 2 diabetes mellitus (HCC) 06/10/2016  . Myalgia and myositis 03/31/2016  . Dyspnea on exertion 02/09/2016  . Abdominal bloating 02/05/2016  . Chronic gouty arthritis 11/29/2015  . Ache in joint 11/27/2015  . LBP (low back pain) 11/27/2015  . Arthralgia of multiple joints 11/27/2015  . Psoriasis 11/27/2015  . Breast pain in male 11/14/2015  . Sinusitis, chronic 10/16/2015  . Insomnia 10/16/2015  . Adult BMI 30+ 10/04/2015  . New onset of headaches after age 50 09/20/2015  . OSA (obstructive sleep apnea) 07/12/2015  . Low serum testosterone level 05/18/2015  . Depression 05/16/2015  . Chronic pain   . CAD (coronary artery disease) 12/24/2014  . Persistent atrial fibrillation (HCC) 12/24/2014  . Essential hypertension 12/24/2014  . CKD (chronic kidney disease) stage 3, GFR 30-59 ml/min 12/24/2014  . PUD (peptic ulcer disease) 12/24/2014  . DM2 (diabetes mellitus, type 2) (HCC) 12/24/2014     Current Outpatient Prescriptions on File Prior to Visit  Medication Sig Dispense Refill  . ACCU-CHEK AVIVA PLUS test strip USE ONE STRIP TO CHECK BLOOD GLUCOSE 3 TIMES DAILY 100 each 12  . ACCU-CHEK FASTCLIX LANCETS MISC Use a directed to check blood sugar 3 times daily. 100 each 11  . albuterol (PROVENTIL HFA;VENTOLIN HFA) 108 (90 Base) MCG/ACT inhaler Inhale 2 puffs into the lungs every 6 (six) hours as needed for wheezing or shortness of breath. 1 Inhaler 2  . aspirin (EQ ASPIRIN ADULT LOW DOSE) 81 MG EC tablet Take 1 tablet (  81 mg total) by mouth daily. Swallow whole. 90 tablet 3  . atorvastatin (LIPITOR) 40 MG tablet Take 1 tablet (40 mg total) by mouth daily. 90 tablet 3  . BD PEN NEEDLE NANO U/F 32G X 4 MM MISC USE ONE PEN NEEDLE THREE TIMES DAILY 100 each 11  . Blood Glucose Monitoring Suppl (ACCU-CHEK AVIVA PLUS) w/Device KIT 1 Device by Does not apply route 3 (three) times daily after meals. E11.9 1 kit 0  . Blood Pressure KIT 1  each by Does not apply route 2 (two) times daily. 1 each 0  . Eszopiclone 3 MG TABS Take 1 tablet (3 mg total) by mouth at bedtime. Take immediately before bedtime 30 tablet 5  . fluorometholone (FML) 0.1 % ophthalmic suspension Place 1 drop into both eyes 3 (three) times daily.    . furosemide (LASIX) 40 MG tablet Take 1 tablet by mouth every morning (Patient taking differently: Take 40 mg by mouth every morning. Take 1 tablet by mouth every morning) 90 tablet 3  . glucose blood (ACCU-CHEK AVIVA PLUS) test strip 1 each by Other route 3 (three) times daily. E11.9 100 each 12  . HYDROcodone-acetaminophen (NORCO/VICODIN) 5-325 MG tablet Take 1 tablet by mouth every 8 (eight) hours as needed for moderate pain. Per pain management    . insulin aspart (NOVOLOG FLEXPEN) 100 UNIT/ML FlexPen Inject 14-24 Units into the skin 3 (three) times daily with meals. 15 mL 5  . Insulin Detemir (LEVEMIR FLEXTOUCH) 100 UNIT/ML Pen Inject 50 Units into the skin daily at 10 pm. 45 mL 3  . leflunomide (ARAVA) 10 MG tablet Take 10 mg by mouth daily.    [name] losartan (COZAAR) 50 MG tablet Take 50 mg by mouth daily.    . metFORMIN (GLUCOPHAGE-XR) 500 MG 24 hr tablet Take 2 tablets (1,000 mg total) by mouth 2 (two) times daily. 360 tablet 3  . predniSONE (DELTASONE) 10 MG tablet Take 1 tablet (10 mg total) by mouth daily with breakfast. 90 tablet 3  . pregabalin (LYRICA) 100 MG capsule Take 1 capsule (100 mg total) by mouth 3 (three) times daily. 90 capsule 5  . ranitidine (ZANTAC) 150 MG tablet Take 1 tablet (150 mg total) by mouth at bedtime. (Patient taking differently: Take 150 mg by mouth daily before breakfast. ) 90 tablet 3  . Secukinumab (COSENTYX) 150 MG/ML SOSY Inject 150 mg into the skin every 28 (twenty-eight) days.      No current facility-administered medications on file prior to visit.     No Known Allergies  Social History   Social History  . Marital status: Married    Spouse name: Amethyst  . Number of  children: 8  . Years of education: 53   Occupational History  .      disabled   Social History Main Topics  . Smoking status: Former Smoker    Packs/day: 0.50    Years: 10.00    Types: Cigarettes    Quit date: 04/11/2008  . Smokeless tobacco: Never Used  . Alcohol use No  . Drug use: No  . Sexual activity: Yes    Partners: Female    Birth control/ protection: None   Other Topics Concern  . Not on file   Social History Narrative   Lives with wife. Does not work.  On disability.    Caffeine- coffee, 1/2 cup daily    Family History  Problem Relation Age of Onset  . Hypertension Mother   . Diabetes  Mother   . COPD Mother   . Lung cancer Mother        Smoker   . Hypertension Father   . Diabetes Father   . Heart Problems Father   . COPD Father   . Colon cancer Neg Hx   . Stomach cancer Neg Hx     Past Surgical History:  Procedure Laterality Date  . CARDIAC CATHETERIZATION  05/2014   ablation for atrial fibrillation  . CARDIAC CATHETERIZATION N/A 11/16/2015   Procedure: Left Heart Cath and Coronary Angiography;  Surgeon: David W Harding, MD;  Location: [facility];  Service: Cardiovascular;  Laterality: N/A;  . COLON RESECTION  09/2013   due to large, abnormal polpy. non cancerous per patient.   . COLON SURGERY  09/2014   colon resection     ROS: Review of Systems Neg except as stated above PHYSICAL EXAM: BP 116/79   Pulse 81   Temp 98.5 F (36.9 C) (Oral)   Resp 16   Wt 223 lb 12.8 oz (101.5 kg)   SpO2 95%   BMI 38.42 kg/m   BP 106/80 Physical Exam General appearance - alert, well appearing, older AAM and in no distress Mental status - alert, oriented to person, place, and time, normal mood, behavior, speech, dress, motor activity, and thought processes Neck - supple, no significant adenopathy Chest - clear to auscultation, no wheezes, rales or rhonchi, symmetric air entry Heart - normal rate, regular rhythm, normal S1, S2, no murmurs, rubs,  clicks or gallops Abdomen - soft, nontender, nondistended, no masses or organomegaly Extremities - no LE edema    Chemistry      Component Value Date/Time   NA 148 (H) 04/17/2017 1438   K 3.5 04/17/2017 1438   CL 98 04/17/2017 1438   CO2 28 04/17/2017 1438   BUN 23 04/17/2017 1438   CREATININE 1.58 (H) 04/17/2017 1438   CREATININE 1.42 (H) 09/09/2016 1037      Component Value Date/Time   CALCIUM 9.4 04/17/2017 1438   ALKPHOS 32 (L) 04/17/2017 1221   AST 13 04/17/2017 1221   ALT 22 04/17/2017 1221   BILITOT 0.3 04/17/2017 1221     Results for orders placed or performed in visit on 04/17/17  Basic Metabolic Panel  Result Value Ref Range   Glucose 129 (H) 65 - 99 mg/dL   BUN 23 8 - 27 mg/dL   Creatinine, Ser 1.58 (H) 0.76 - 1.27 mg/dL   GFR calc non Af Amer 46 (L) >59 mL/min/1.73   GFR calc Af Amer 53 (L) >59 mL/min/1.73   BUN/Creatinine Ratio 15 10 - 24   Sodium 148 (H) 134 - 144 mmol/L   Potassium 3.5 3.5 - 5.2 mmol/L   Chloride 98 96 - 106 mmol/L   CO2 28 20 - 29 mmol/L   Calcium 9.4 8.6 - 10.2 mg/dL  POCT glucose (manual entry)  Result Value Ref Range   POC Glucose 151 (A) 70 - 99 mg/dl   Lab Results  Component Value Date   HGBA1C 7.6 03/05/2017    ASSESSMENT AND PLAN: 1. Type 2 diabetes mellitus without complication, with long-term current use of insulin (HCC) -continue Metformin, Novolog and Levemir. Encourage healthy eating habits -pt informed that sometimes pills that are extended release like his Metformin can show up in bowel movements but it is just the shell, the drug has been released into his system - POCT glucose (manual entry)  2. Essential hypertension -control on Losartan. Ideally   should be on B-blocker also due to CHF and mild CAD but will hold off due to BP being soft - Basic Metabolic Panel  3. CKD (chronic kidney disease) stage 3, GFR 30-59 ml/min -repeat BMP. Told to avoid NSAIDS  4. Esophageal dysphagia -before seeking 2nd opinion,  I  recommend he give a trail of 5-7 days of holding narcotics to see if symptoms resolve. If they do, then he can discuss other treatment options for chronic pain. Pt agreeable to this  5. OSA on CPAP -advise trying to wear mask a few hrs during the day to get acclimated to feel. Alternatively, he can try just nasal mask  6. Chronic combined systolic and diastolic CHF (congestive heart failure) ([place]) -compensated   Patient was given the opportunity to ask questions.  Patient verbalized understanding of the plan and was able to repeat key elements of the plan.   Orders Placed This Encounter  Procedures  . Basic Metabolic Panel  . POCT glucose (manual entry)     Requested Prescriptions    No prescriptions requested or ordered in this encounter    Return in about 3 months (around 07/17/2017).  Karle Plumber, MD, FACP

## 2017-04-17 NOTE — Patient Instructions (Signed)
Trying wearing your CPAP mask for several hours during the day. Try stopping your pain med for about 5 days to see if the swallowing issue resolves.

## 2017-04-18 LAB — LIPID PANEL
Chol/HDL Ratio: 2.9 ratio (ref 0.0–5.0)
Cholesterol, Total: 104 mg/dL (ref 100–199)
HDL: 36 mg/dL — AB (ref >39)
LDL Calculated: 33 mg/dL (ref 0–99)
TRIGLYCERIDES: 176 mg/dL — AB (ref 0–149)
VLDL Cholesterol Cal: 35 mg/dL (ref 5–40)

## 2017-04-18 LAB — HEPATIC FUNCTION PANEL
ALK PHOS: 32 IU/L — AB (ref 39–117)
ALT: 22 IU/L (ref 0–44)
AST: 13 IU/L (ref 0–40)
Albumin: 4.4 g/dL (ref 3.6–4.8)
Bilirubin Total: 0.3 mg/dL (ref 0.0–1.2)
Bilirubin, Direct: 0.1 mg/dL (ref 0.00–0.40)
Total Protein: 7.1 g/dL (ref 6.0–8.5)

## 2017-04-18 LAB — BASIC METABOLIC PANEL
BUN / CREAT RATIO: 15 (ref 10–24)
BUN: 23 mg/dL (ref 8–27)
CALCIUM: 9.4 mg/dL (ref 8.6–10.2)
CO2: 28 mmol/L (ref 20–29)
CREATININE: 1.58 mg/dL — AB (ref 0.76–1.27)
Chloride: 98 mmol/L (ref 96–106)
GFR calc non Af Amer: 46 mL/min/{1.73_m2} — ABNORMAL LOW (ref >59)
GFR, EST AFRICAN AMERICAN: 53 mL/min/{1.73_m2} — AB (ref >59)
GLUCOSE: 129 mg/dL — AB (ref 65–99)
Potassium: 3.5 mmol/L (ref 3.5–5.2)
Sodium: 148 mmol/L — ABNORMAL HIGH (ref 134–144)

## 2017-04-20 ENCOUNTER — Ambulatory Visit (HOSPITAL_COMMUNITY): Payer: Medicaid Other

## 2017-04-21 ENCOUNTER — Telehealth: Payer: Self-pay | Admitting: *Deleted

## 2017-04-21 DIAGNOSIS — Z79899 Other long term (current) drug therapy: Secondary | ICD-10-CM

## 2017-04-21 DIAGNOSIS — E7849 Other hyperlipidemia: Secondary | ICD-10-CM

## 2017-04-21 NOTE — Telephone Encounter (Signed)
Spoke with patient to notify him of his lab results. Patient expressed verbal understanding and we scheduled his four month follow up lab appointment. No further questions. CRW

## 2017-04-22 ENCOUNTER — Ambulatory Visit (HOSPITAL_COMMUNITY): Payer: Medicaid Other

## 2017-04-23 ENCOUNTER — Encounter (HOSPITAL_COMMUNITY)
Admission: RE | Admit: 2017-04-23 | Discharge: 2017-04-23 | Disposition: A | Discharge status: Home / Self Care | Payer: Medicaid Other | Source: Ambulatory Visit | Attending: Cardiology | Admitting: Cardiology

## 2017-04-23 ENCOUNTER — Encounter (HOSPITAL_COMMUNITY): Payer: Self-pay

## 2017-04-23 VITALS — BP 104/60 | HR 87 | Ht 65.5 in | Wt 225.3 lb

## 2017-04-23 DIAGNOSIS — E1122 Type 2 diabetes mellitus with diabetic chronic kidney disease: Secondary | ICD-10-CM | POA: Diagnosis not present

## 2017-04-23 DIAGNOSIS — N183 Chronic kidney disease, stage 3 (moderate): Secondary | ICD-10-CM | POA: Diagnosis not present

## 2017-04-23 DIAGNOSIS — Z6838 Body mass index (BMI) 38.0-38.9, adult: Secondary | ICD-10-CM | POA: Diagnosis not present

## 2017-04-23 DIAGNOSIS — Z794 Long term (current) use of insulin: Secondary | ICD-10-CM | POA: Diagnosis not present

## 2017-04-23 DIAGNOSIS — Z87891 Personal history of nicotine dependence: Secondary | ICD-10-CM | POA: Diagnosis not present

## 2017-04-23 DIAGNOSIS — I130 Hypertensive heart and chronic kidney disease with heart failure and stage 1 through stage 4 chronic kidney disease, or unspecified chronic kidney disease: Secondary | ICD-10-CM | POA: Diagnosis not present

## 2017-04-23 DIAGNOSIS — I5042 Chronic combined systolic (congestive) and diastolic (congestive) heart failure: Secondary | ICD-10-CM | POA: Insufficient documentation

## 2017-04-23 DIAGNOSIS — Z79899 Other long term (current) drug therapy: Secondary | ICD-10-CM | POA: Insufficient documentation

## 2017-04-23 DIAGNOSIS — I110 Hypertensive heart disease with heart failure: Secondary | ICD-10-CM | POA: Diagnosis present

## 2017-04-23 NOTE — Progress Notes (Signed)
Marco [name] 61 y.o. male DOB: 1956-07-11 MRN: [id number]      Nutrition Note  Dx: CHF  Past Medical History:  Diagnosis Date  . Benign colon polyp 08/01/2013   [facility] in [place]. large base tranverse colon polyp was biopsied. polyp was benign with minimal surface hyperplastic change.  . Chronic combined systolic and diastolic CHF (congestive heart failure) ([place])   . Chronic pain   . CKD (chronic kidney disease), stage III   . Diabetes mellitus without complication ([place])   . Diverticulosis   . Esophageal hiatal hernia 07/29/2013   confirmed on EGD   . Esophageal stricture   . Essential hypertension   . Gastritis 07/29/2013   confirmed on EGD, bx done an negative for intestinal metaplasia, dsyplasia or H. pylori. normal gastric emptying study done 07/13/2013.  [name] GERD (gastroesophageal reflux disease)   . Morbid obesity ([place])   . Non-obstructive CAD    a. 02/2013 Cath ([facility]): nonobs dzs;  b. 09/2014 Myoview ([facility]): EF 55%, no ischemia;  c. 12/2014 Echo: EF 50-55%, gr1 DD, no effusion.  . Persistent atrial fibrillation ([place])    a. 02/2013 s/p rfca in [place], [place]-->prev on Xarelto, d/c'd 2/2 anemia, ? GIB.  [name] Rheumatoid arthritis ([place])   . Sigmoid diverticulosis 08/01/2013   confirmed on colonscopy. record scanned into chart   Meds reviewed. Metformin, Levemir, Novolog, Deltasone noted  HT: Ht Readings from Last 1 Encounters:  04/07/17 5\' 4"  (1.626 m)    WT: Wt Readings from Last 3 Encounters:  04/17/17 223 lb 12.8 oz (101.5 kg)  04/07/17 223 lb (101.2 kg)  03/23/17 223 lb (101.2 kg)     BMI 38.4   Current tobacco use? No  Labs:  Lipid Panel     Component Value Date/Time   CHOL 104 04/17/2017 1221   TRIG 176 (H) 04/17/2017 1221   HDL 36 (L) 04/17/2017 1221   CHOLHDL 2.9 04/17/2017 1221   CHOLHDL 2.6 06/10/2016 1253   VLDL 18 06/10/2016 1253   LDLCALC 33 04/17/2017 1221    Lab Results  Component Value Date   HGBA1C 7.6 03/05/2017   CBG (last 3)   No results for input(s): GLUCAP in the last 72 hours.  Nutrition Note Spoke with pt. Nutrition plan and goals reviewed with pt. Pt is following Step 1 of the Therapeutic Lifestyle Changes diet. Pt wants to lose wt. Pt has been trying to lose wt by decreasing portion sizes consumed. Wt loss tips reviewed. Pt is diabetic. Last A1c indicates blood glucose not optimally controlled. Pt reports his A1c is usually around 7.6. Pt checks CBG's 2-3 times a day before meals. Pt states he did not attend any classes re: DM "because I had already been to all of them with my mom." Barriers to adherence to diet and DM recommendations are pt reports financial difficulty buying food. Per discussion, pt receives $60 per month in food stamps. Local food resources discussed. Pt expressed understanding of the information reviewed. Pt aware of nutrition education classes offered and plans on attending nutrition classes.  Nutrition Diagnosis ? Food-and nutrition-related knowledge deficit related to lack of exposure to information as related to diagnosis of: ? CVD ? DM ? Obesity related to excessive energy intake as evidenced by a BMI of 38.4  Nutrition Intervention ? Pt's individual nutrition plan and goals reviewed with pt. ? Pt given handouts for: [employer] for food  Nutrition Goal(s):  ? Pt to identify food quantities necessary to achieve  weight loss of 6-24 lb (2.7-10.9 kg) at graduation from cardiac rehab. Goal wt of 180-190 lb desired.  ? CBG concentrations in the normal range or as close to normal as is safely possible.  Plan:  Pt to attend nutrition classes ? Nutrition I ? Nutrition II ? Portion Distortion ? Diabetes Blitz ? Diabetes Q & A Will provide client-centered nutrition education as part of interdisciplinary care.   Monitor and evaluate progress toward nutrition goal with team.  Marco [name], M.Ed, RD, LDN, CDE 04/23/2017 1:41 PM

## 2017-04-23 NOTE — Progress Notes (Signed)
Cardiac Individual Treatment Plan  Patient Details  Name: Marco [name] MRN: [id number] Date of Birth: 1955-11-07 Referring Provider:     CARDIAC REHAB PHASE II ORIENTATION from 04/23/2017 in [place]  Referring Provider  [name] MD      Initial Encounter Date:    CARDIAC REHAB PHASE II ORIENTATION from 04/23/2017 in [place]  Date  04/23/17  Referring Provider  [name] MD      Visit Diagnosis: Heart failure, systolic and diastolic, chronic ([place])  Patient's Home Medications on Admission:  Current Outpatient Prescriptions:  .  ACCU-CHEK AVIVA PLUS test strip, USE ONE STRIP TO CHECK BLOOD GLUCOSE 3 TIMES DAILY, Disp: 100 each, Rfl: 12 .  ACCU-CHEK FASTCLIX LANCETS MISC, Use a directed to check blood sugar 3 times daily., Disp: 100 each, Rfl: 11 .  albuterol (PROVENTIL HFA;VENTOLIN HFA) 108 (90 Base) MCG/ACT inhaler, Inhale 2 puffs into the lungs every 6 (six) hours as needed for wheezing or shortness of breath., Disp: 1 Inhaler, Rfl: 2 .  aspirin (EQ ASPIRIN ADULT LOW DOSE) 81 MG EC tablet, Take 1 tablet (81 mg total) by mouth daily. Swallow whole., Disp: 90 tablet, Rfl: 3 .  atorvastatin (LIPITOR) 40 MG tablet, Take 1 tablet (40 mg total) by mouth daily., Disp: 90 tablet, Rfl: 3 .  BD PEN NEEDLE NANO U/F 32G X 4 MM MISC, USE ONE PEN NEEDLE THREE TIMES DAILY, Disp: 100 each, Rfl: 11 .  Blood Glucose Monitoring Suppl (ACCU-CHEK AVIVA PLUS) w/Device KIT, 1 Device by Does not apply route 3 (three) times daily after meals. E11.9, Disp: 1 kit, Rfl: 0 .  Blood Pressure KIT, 1 each by Does not apply route 2 (two) times daily., Disp: 1 each, Rfl: 0 .  Eszopiclone 3 MG TABS, Take 1 tablet (3 mg total) by mouth at bedtime. Take immediately before bedtime, Disp: 30 tablet, Rfl: 5 .  fluorometholone (FML) 0.1 % ophthalmic suspension, Place 1 drop into both eyes 3 (three) times daily., Disp: , Rfl:  .  furosemide (LASIX)  40 MG tablet, Take 1 tablet by mouth every morning (Patient taking differently: Take 40 mg by mouth every morning. Take 1 tablet by mouth every morning), Disp: 90 tablet, Rfl: 3 .  glucose blood (ACCU-CHEK AVIVA PLUS) test strip, 1 each by Other route 3 (three) times daily. E11.9, Disp: 100 each, Rfl: 12 .  HYDROcodone-acetaminophen (NORCO/VICODIN) 5-325 MG tablet, Take 1 tablet by mouth every 8 (eight) hours as needed for moderate pain. Per pain management, Disp: , Rfl:  .  insulin aspart (NOVOLOG FLEXPEN) 100 UNIT/ML FlexPen, Inject 14-24 Units into the skin 3 (three) times daily with meals., Disp: 15 mL, Rfl: 5 .  Insulin Detemir (LEVEMIR FLEXTOUCH) 100 UNIT/ML Pen, Inject 50 Units into the skin daily at 10 pm., Disp: 45 mL, Rfl: 3 .  leflunomide (ARAVA) 10 MG tablet, Take 10 mg by mouth daily., Disp: , Rfl:  .  losartan (COZAAR) 50 MG tablet, Take 50 mg by mouth daily., Disp: , Rfl:  .  metFORMIN (GLUCOPHAGE-XR) 500 MG 24 hr tablet, Take 2 tablets (1,000 mg total) by mouth 2 (two) times daily., Disp: 360 tablet, Rfl: 3 .  predniSONE (DELTASONE) 10 MG tablet, Take 1 tablet (10 mg total) by mouth daily with breakfast., Disp: 90 tablet, Rfl: 3 .  pregabalin (LYRICA) 100 MG capsule, Take 1 capsule (100 mg total) by mouth 3 (three) times daily., Disp: 90 capsule, Rfl: 5 .  ranitidine (ZANTAC) 150 MG tablet, Take 1 tablet (150 mg total) by mouth at bedtime. (Patient taking differently: Take 150 mg by mouth daily before breakfast. ), Disp: 90 tablet, Rfl: 3 .  Secukinumab (COSENTYX) 150 MG/ML SOSY, Inject 150 mg into the skin every 28 (twenty-eight) days. , Disp: , Rfl:   Past Medical History: Past Medical History:  Diagnosis Date  . Benign colon polyp 08/01/2013   [facility] in [place]. large base tranverse colon polyp was biopsied. polyp was benign with minimal surface hyperplastic change.  . Chronic combined systolic and diastolic CHF (congestive heart failure) ([place])   . Chronic pain    . CKD (chronic kidney disease), stage III   . Diabetes mellitus without complication ([place])   . Diverticulosis   . Esophageal hiatal hernia 07/29/2013   confirmed on EGD   . Esophageal stricture   . Essential hypertension   . Gastritis 07/29/2013   confirmed on EGD, bx done an negative for intestinal metaplasia, dsyplasia or H. pylori. normal gastric emptying study done 07/13/2013.  [name] GERD (gastroesophageal reflux disease)   . Morbid obesity ([place])   . Non-obstructive CAD    a. 02/2013 Cath ([place]): nonobs dzs;  b. 09/2014 Myoview ([place]): EF 55%, no ischemia;  c. 12/2014 Echo: EF 50-55%, gr1 DD, no effusion.  . Persistent atrial fibrillation ([place])    a. 02/2013 s/p rfca in [place], [place]-->prev on Xarelto, d/c'd 2/2 anemia, ? GIB.  [name] Rheumatoid arthritis ([place])   . Sigmoid diverticulosis 08/01/2013   confirmed on colonscopy. record scanned into chart    Tobacco Use: History  Smoking Status  . Former Smoker  . Packs/day: 0.50  . Years: 10.00  . Types: Cigarettes  . Quit date: 04/11/2008  Smokeless Tobacco  . Never Used    Labs: Recent Review Flowsheet Data    Labs for ITP Cardiac and Pulmonary Rehab Latest Ref Rng & Units 09/02/2016 09/02/2016 12/04/2016 03/05/2017 04/17/2017   Cholestrol 100 - 199 mg/dL - - - - 104   LDLCALC 0 - 99 mg/dL - - - - 33   HDL >39 mg/dL - - - - 36(L)   Trlycerides 0 - 149 mg/dL - - - - 176(H)   Hemoglobin A1c - - 7.5(H) 6.9 7.6 -   TCO2 0 - 100 mmol/L 24 - - - -      Capillary Blood Glucose: Lab Results  Component Value Date   GLUCAP 164 (H) 09/06/2016   GLUCAP 250 (H) 09/05/2016   GLUCAP 315 (H) 09/05/2016   GLUCAP 205 (H) 09/05/2016   GLUCAP 115 (H) 09/05/2016     Exercise Target Goals: Date: 04/23/17  Exercise Program Goal: Individual exercise prescription set with THRR, safety & activity barriers. Participant demonstrates ability to understand and report RPE using BORG scale, to self-measure pulse accurately, and to acknowledge the importance  of the exercise prescription.  Exercise Prescription Goal: Starting with aerobic activity 30 plus minutes a day, 3 days per week for initial exercise prescription. Provide home exercise prescription and guidelines that participant acknowledges understanding prior to discharge.  Activity Barriers & Risk Stratification:     Activity Barriers & Cardiac Risk Stratification - 04/23/17 1547      Activity Barriers & Cardiac Risk Stratification   Activity Barriers Arthritis;Back Problems;Deconditioning;Joint Problems;Muscular Weakness;Other (comment);Shortness of Breath      6 Minute Walk:     6 Minute Walk    Row Name 04/23/17 1530  6 Minute Walk   Phase Initial     Distance 1107 feet     Walk Time 6 minutes     # of Rest Breaks 0     MPH 2.09     METS 2.4     RPE 13     VO2 Peak 8.54     Symptoms Yes (comment)     Comments fatigue!     Resting HR 87 bpm     Resting BP 104/60     Resting Oxygen Saturation  96 %     Exercise Oxygen Saturation  during 6 min walk 95 %     Max Ex. HR 101 bpm     Max Ex. BP 128/72     2 Minute Post BP 116/64        Oxygen Initial Assessment:   Oxygen Re-Evaluation:   Oxygen Discharge (Final Oxygen Re-Evaluation):   Initial Exercise Prescription:     Initial Exercise Prescription - 04/23/17 1500      Date of Initial Exercise RX and Referring Provider   Date 04/23/17   Referring Provider [name] MD     Bike   Level 0.5   Minutes 10   METs 1.94     NuStep   Level 2   SPM 75   Minutes 10   METs 1.8     Track   Laps 7   Minutes 10   METs 2.23     Prescription Details   Frequency (times per week) 3   Duration Progress to 30 minutes of continuous aerobic without signs/symptoms of physical distress     Intensity   THRR 40-80% of Max Heartrate 63-126   Ratings of Perceived Exertion 11-13   Perceived Dyspnea 0-4     Progression   Progression Continue to progress workloads to maintain intensity without  signs/symptoms of physical distress.     Resistance Training   Training Prescription Yes   Weight 2lbs   Reps 10-15      Perform Capillary Blood Glucose checks as needed.  Exercise Prescription Changes:   Exercise Comments:   Exercise Goals and Review:      Exercise Goals    Row Name 04/23/17 1546             Exercise Goals   Increase Physical Activity Yes       Intervention Provide advice, education, support and counseling about physical activity/exercise needs.;Develop an individualized exercise prescription for aerobic and resistive training based on initial evaluation findings, risk stratification, comorbidities and participant's personal goals.       Expected Outcomes Achievement of increased cardiorespiratory fitness and enhanced flexibility, muscular endurance and strength shown through measurements of functional capacity and personal statement of participant.       Increase Strength and Stamina Yes       Intervention Provide advice, education, support and counseling about physical activity/exercise needs.;Develop an individualized exercise prescription for aerobic and resistive training based on initial evaluation findings, risk stratification, comorbidities and participant's personal goals.       Expected Outcomes Achievement of increased cardiorespiratory fitness and enhanced flexibility, muscular endurance and strength shown through measurements of functional capacity and personal statement of participant.       Able to understand and use rate of perceived exertion (RPE) scale Yes       Intervention Provide education and explanation on how to use RPE scale       Expected Outcomes Short Term: Able to use RPE  daily in rehab to express subjective intensity level;Long Term:  Able to use RPE to guide intensity level when exercising independently       Knowledge and understanding of Target Heart Rate Range (THRR) Yes       Intervention Provide education and explanation of  THRR including how the numbers were predicted and where they are located for reference       Expected Outcomes Short Term: Able to state/look up THRR;Long Term: Able to use THRR to govern intensity when exercising independently;Short Term: Able to use daily as guideline for intensity in rehab       Able to check pulse independently Yes       Intervention Provide education and demonstration on how to check pulse in carotid and radial arteries.;Review the importance of being able to check your own pulse for safety during independent exercise       Expected Outcomes Short Term: Able to explain why pulse checking is important during independent exercise;Long Term: Able to check pulse independently and accurately       Understanding of Exercise Prescription Yes       Intervention Provide education, explanation, and written materials on patient's individual exercise prescription       Expected Outcomes Short Term: Able to explain program exercise prescription;Long Term: Able to explain home exercise prescription to exercise independently          Exercise Goals Re-Evaluation :    Discharge Exercise Prescription (Final Exercise Prescription Changes):   Nutrition:  Target Goals: Understanding of nutrition guidelines, daily intake of sodium '1500mg'$ , cholesterol '200mg'$ , calories 30% from fat and 7% or less from saturated fats, daily to have 5 or more servings of fruits and vegetables.  Biometrics:     Pre Biometrics - 04/23/17 1622      Pre Biometrics   Height 5' 5.5" (1.664 m)   Weight 225 lb 5 oz (102.2 kg)   Waist Circumference 49 inches   Hip Circumference 43 inches   Waist to Hip Ratio 1.14 %   BMI (Calculated) 36.91   Triceps Skinfold 43 mm   % Body Fat 39.9 %   Grip Strength 45 kg   Single Leg Stand 5 seconds       Nutrition Therapy Plan and Nutrition Goals:     Nutrition Therapy & Goals - 04/23/17 1451      Nutrition Therapy   Diet Carb Modified, Therapeutic Lifestyle  Changes     Personal Nutrition Goals   Nutrition Goal Pt to identify food quantities necessary to achieve weight loss of 6-24 lb (2.7-10.9 kg) at graduation from cardiac rehab. Goal wt of 180-190 lb desired.    Personal Goal #2 CBG concentrations in the normal range or as close to normal as is safely possible.     Intervention Plan   Intervention Prescribe, educate and counsel regarding individualized specific dietary modifications aiming towards targeted core components such as weight, hypertension, lipid management, diabetes, heart failure and other comorbidities.;Nutrition handout(s) given to patient.  Conservator, museum/gallery given   Expected Outcomes Short Term Goal: Understand basic principles of dietary content, such as calories, fat, sodium, cholesterol and nutrients.;Long Term Goal: Adherence to prescribed nutrition plan.      Nutrition Discharge: Nutrition Scores:     Nutrition Assessments - 04/23/17 1451      MEDFICTS Scores   Pre Score 48      Nutrition Goals Re-Evaluation:   Nutrition Goals Re-Evaluation:   Nutrition Goals Discharge (Final Nutrition  Goals Re-Evaluation):   Psychosocial: Target Goals: Acknowledge presence or absence of significant depression and/or stress, maximize coping skills, provide positive support system. Participant is able to verbalize types and ability to use techniques and skills needed for reducing stress and depression.  Initial Review & Psychosocial Screening:     Initial Psych Review & Screening - 04/23/17 1615      Initial Review   Current issues with None Identified     Family Dynamics   Good Support System? Yes     Barriers   Psychosocial barriers to participate in program There are no identifiable barriers or psychosocial needs.     Screening Interventions   Interventions Encouraged to exercise      Quality of Life Scores:     Quality of Life - 04/23/17 1547      Quality of Life Scores    Health/Function Pre 12.77 %   Socioeconomic Pre 22.14 %   Psych/Spiritual Pre 21.43 %   Family Pre 30 %   GLOBAL Pre 19.01 %      PHQ-9: Recent Review Flowsheet Data    Depression screen [facility] 2/9 04/17/2017 03/23/2017 03/16/2017 03/05/2017 02/05/2017   Decreased Interest 0 1 0 1 0   Down, Depressed, Hopeless '1 1 1 2 1   '$ PHQ - 2 Score '1 2 1 3 1   '$ Altered sleeping '1 1 1 2 1   '$ Tired, decreased energy 0 '1 1 2 1   '$ Change in appetite 0 0 0 0 0   Feeling bad or failure about yourself  0 0 0 1 0   Trouble concentrating 0 0 0 0 0   Moving slowly or fidgety/restless 0 0 0 0 0   Suicidal thoughts 0 0 0 0 0   PHQ-9 Score '2 4 3 8 3     '$ Interpretation of Total Score  Total Score Depression Severity:  1-4 = Minimal depression, 5-9 = Mild depression, 10-14 = Moderate depression, 15-19 = Moderately severe depression, 20-27 = Severe depression   Psychosocial Evaluation and Intervention:   Psychosocial Re-Evaluation:   Psychosocial Discharge (Final Psychosocial Re-Evaluation):   Vocational Rehabilitation: Provide vocational rehab assistance to qualifying candidates.   Vocational Rehab Evaluation & Intervention:     Vocational Rehab - 04/23/17 1608      Initial Vocational Rehab Evaluation & Intervention   Assessment shows need for Vocational Rehabilitation No  Mr Staples is disabled and not interested in vocational rehab for job retraining at this time      Education: Education Goals: Education classes will be provided on a weekly basis, covering required topics. Participant will state understanding/return demonstration of topics presented.  Learning Barriers/Preferences:     Learning Barriers/Preferences - 04/23/17 1529      Learning Barriers/Preferences   Learning Barriers Sight   Learning Preferences Skilled Demonstration;Verbal Instruction;Video      Education Topics: Count Your Pulse:  -Group instruction provided by verbal instruction, demonstration, patient participation  and written materials to support subject.  Instructors address importance of being able to find your pulse and how to count your pulse when at home without a heart monitor.  Patients get hands on experience counting their pulse with staff help and individually.   Heart Attack, Angina, and Risk Factor Modification:  -Group instruction provided by verbal instruction, video, and written materials to support subject.  Instructors address signs and symptoms of angina and heart attacks.    Also discuss risk factors for heart disease and how to make changes  to improve heart health risk factors.   Functional Fitness:  -Group instruction provided by verbal instruction, demonstration, patient participation, and written materials to support subject.  Instructors address safety measures for doing things around the house.  Discuss how to get up and down off the floor, how to pick things up properly, how to safely get out of a chair without assistance, and balance training.   Meditation and Mindfulness:  -Group instruction provided by verbal instruction, patient participation, and written materials to support subject.  Instructor addresses importance of mindfulness and meditation practice to help reduce stress and improve awareness.  Instructor also leads participants through a meditation exercise.    Stretching for Flexibility and Mobility:  -Group instruction provided by verbal instruction, patient participation, and written materials to support subject.  Instructors lead participants through series of stretches that are designed to increase flexibility thus improving mobility.  These stretches are additional exercise for major muscle groups that are typically performed during regular warm up and cool down.   Hands Only CPR:  -Group verbal, video, and participation provides a basic overview of AHA guidelines for community CPR. Role-play of emergencies allow participants the opportunity to practice calling  for help and chest compression technique with discussion of AED use.   Hypertension: -Group verbal and written instruction that provides a basic overview of hypertension including the most recent diagnostic guidelines, risk factor reduction with self-care instructions and medication management.    Nutrition I class: Heart Healthy Eating:  -Group instruction provided by PowerPoint slides, verbal discussion, and written materials to support subject matter. The instructor gives an explanation and review of the Therapeutic Lifestyle Changes diet recommendations, which includes a discussion on lipid goals, dietary fat, sodium, fiber, plant stanol/sterol esters, sugar, and the components of a well-balanced, healthy diet.   Nutrition II class: Lifestyle Skills:  -Group instruction provided by PowerPoint slides, verbal discussion, and written materials to support subject matter. The instructor gives an explanation and review of label reading, grocery shopping for heart health, heart healthy recipe modifications, and ways to make healthier choices when eating out.   Diabetes Question & Answer:  -Group instruction provided by PowerPoint slides, verbal discussion, and written materials to support subject matter. The instructor gives an explanation and review of diabetes co-morbidities, pre- and post-prandial blood glucose goals, pre-exercise blood glucose goals, signs, symptoms, and treatment of hypoglycemia and hyperglycemia, and foot care basics.   Diabetes Blitz:  -Group instruction provided by PowerPoint slides, verbal discussion, and written materials to support subject matter. The instructor gives an explanation and review of the physiology behind type 1 and type 2 diabetes, diabetes medications and rational behind using different medications, pre- and post-prandial blood glucose recommendations and Hemoglobin A1c goals, diabetes diet, and exercise including blood glucose guidelines for exercising  safely.    Portion Distortion:  -Group instruction provided by PowerPoint slides, verbal discussion, written materials, and food models to support subject matter. The instructor gives an explanation of serving size versus portion size, changes in portions sizes over the last 20 years, and what consists of a serving from each food group.   Stress Management:  -Group instruction provided by verbal instruction, video, and written materials to support subject matter.  Instructors review role of stress in heart disease and how to cope with stress positively.     Exercising on Your Own:  -Group instruction provided by verbal instruction, power point, and written materials to support subject.  Instructors discuss benefits of exercise, components of exercise,  frequency and intensity of exercise, and end points for exercise.  Also discuss use of nitroglycerin and activating EMS.  Review options of places to exercise outside of rehab.  Review guidelines for sex with heart disease.   Cardiac Drugs I:  -Group instruction provided by verbal instruction and written materials to support subject.  Instructor reviews cardiac drug classes: antiplatelets, anticoagulants, beta blockers, and statins.  Instructor discusses reasons, side effects, and lifestyle considerations for each drug class.   Cardiac Drugs II:  -Group instruction provided by verbal instruction and written materials to support subject.  Instructor reviews cardiac drug classes: angiotensin converting enzyme inhibitors (ACE-I), angiotensin II receptor blockers (ARBs), nitrates, and calcium channel blockers.  Instructor discusses reasons, side effects, and lifestyle considerations for each drug class.   Anatomy and Physiology of the Circulatory System:  Group verbal and written instruction and models provide basic cardiac anatomy and physiology, with the coronary electrical and arterial systems. Review of: AMI, Angina, Valve disease, Heart  Failure, Peripheral Artery Disease, Cardiac Arrhythmia, Pacemakers, and the ICD.   Other Education:  -Group or individual verbal, written, or video instructions that support the educational goals of the cardiac rehab program.   Knowledge Questionnaire Score:     Knowledge Questionnaire Score - 04/23/17 1545      Knowledge Questionnaire Score   Pre Score 22/24      Core Components/Risk Factors/Patient Goals at Admission:     Personal Goals and Risk Factors at Admission - 04/23/17 1627      Core Components/Risk Factors/Patient Goals on Admission    Weight Management Yes;Weight Maintenance;Obesity   Intervention Weight Management: Develop a combined nutrition and exercise program designed to reach desired caloric intake, while maintaining appropriate intake of nutrient and fiber, sodium and fats, and appropriate energy expenditure required for the weight goal.;Weight Management: Provide education and appropriate resources to help participant work on and attain dietary goals.;Weight Management/Obesity: Establish reasonable short term and long term weight goals.;Obesity: Provide education and appropriate resources to help participant work on and attain dietary goals.   Expected Outcomes Short Term: Continue to assess and modify interventions until short term weight is achieved;Long Term: Adherence to nutrition and physical activity/exercise program aimed toward attainment of established weight goal;Weight Loss: Understanding of general recommendations for a balanced deficit meal plan, which promotes 1-2 lb weight loss per week and includes a negative energy balance of [phone] kcal/d;Weight Maintenance: Understanding of the daily nutrition guidelines, which includes 25-35% calories from fat, 7% or less cal from saturated fats, less than '200mg'$  cholesterol, less than 1.5gm of sodium, & 5 or more servings of fruits and vegetables daily;Understanding recommendations for meals to include 15-35% energy  as protein, 25-35% energy from fat, 35-60% energy from carbohydrates, less than '200mg'$  of dietary cholesterol, 20-35 gm of total fiber daily;Understanding of distribution of calorie intake throughout the day with the consumption of 4-5 meals/snacks   Improve shortness of breath with ADL's Yes   Intervention Provide education, individualized exercise plan and daily activity instruction to help decrease symptoms of SOB with activities of daily living.   Expected Outcomes Short Term: Achieves a reduction of symptoms when performing activities of daily living.   Diabetes Yes   Intervention Provide education about signs/symptoms and action to take for hypo/hyperglycemia.;Provide education about proper nutrition, including hydration, and aerobic/resistive exercise prescription along with prescribed medications to achieve blood glucose in normal ranges: Fasting glucose 65-99 mg/dL   Expected Outcomes Short Term: Participant verbalizes understanding of the signs/symptoms and immediate  care of hyper/hypoglycemia, proper foot care and importance of medication, aerobic/resistive exercise and nutrition plan for blood glucose control.;Long Term: Attainment of HbA1C < 7%.   Heart Failure Yes   Intervention Provide a combined exercise and nutrition program that is supplemented with education, support and counseling about heart failure. Directed toward relieving symptoms such as shortness of breath, decreased exercise tolerance, and extremity edema.   Expected Outcomes Improve functional capacity of life;Short term: Daily weights obtained and reported for increase. Utilizing diuretic protocols set by physician.;Short term: Attendance in program 2-3 days a week with increased exercise capacity. Reported lower sodium intake. Reported increased fruit and vegetable intake. Reports medication compliance.;Long term: Adoption of self-care skills and reduction of barriers for early signs and symptoms recognition and intervention  leading to self-care maintenance.   Hypertension Yes   Intervention Provide education on lifestyle modifcations including regular physical activity/exercise, weight management, moderate sodium restriction and increased consumption of fresh fruit, vegetables, and low fat dairy, alcohol moderation, and smoking cessation.;Monitor prescription use compliance.   Expected Outcomes Long Term: Maintenance of blood pressure at goal levels.;Short Term: Continued assessment and intervention until BP is < 140/88m HG in hypertensive participants. < 130/865mHG in hypertensive participants with diabetes, heart failure or chronic kidney disease.   Lipids Yes   Intervention Provide education and support for participant on nutrition & aerobic/resistive exercise along with prescribed medications to achieve LDL '70mg'$ , HDL >'40mg'$ .   Expected Outcomes Short Term: Participant states understanding of desired cholesterol values and is compliant with medications prescribed. Participant is following exercise prescription and nutrition guidelines.;Long Term: Cholesterol controlled with medications as prescribed, with individualized exercise RX and with personalized nutrition plan. Value goals: LDL < '70mg'$ , HDL > 40 mg.   Stress Yes   Intervention Offer individual and/or small group education and counseling on adjustment to heart disease, stress management and health-related lifestyle change. Teach and support self-help strategies.;Refer participants experiencing significant psychosocial distress to appropriate mental health specialists for further evaluation and treatment. When possible, include family members and significant others in education/counseling sessions.   Expected Outcomes Short Term: Participant demonstrates changes in health-related behavior, relaxation and other stress management skills, ability to obtain effective social support, and compliance with psychotropic medications if prescribed.;Long Term: Emotional  wellbeing is indicated by absence of clinically significant psychosocial distress or social isolation.      Core Components/Risk Factors/Patient Goals Review:    Core Components/Risk Factors/Patient Goals at Discharge (Final Review):    ITP Comments:     ITP Comments    Row Name 04/23/17 1525 04/23/17 15Shawneeirector, Dr. TrTressia Minersedical Director, Dr. TrFransico Him       Comments: RoEdd Arbourttended orientation from 1330 to 1508 to review rules and guidelines for program. Completed 6 minute walk test, Intitial ITP, and exercise prescription.  VSS. Telemetry-Sinus Rhythm. Mr GaPrienid complain of fatigue, but was able to complete his walk test without stopping to rest.Omari Koslosky WhVenetia MaxonRN,BSN 04/23/2017 4:35 PM

## 2017-04-24 ENCOUNTER — Ambulatory Visit (HOSPITAL_COMMUNITY): Payer: Medicaid Other

## 2017-04-27 ENCOUNTER — Ambulatory Visit (HOSPITAL_COMMUNITY): Payer: Medicaid Other

## 2017-04-27 ENCOUNTER — Encounter (HOSPITAL_COMMUNITY)
Admission: RE | Admit: 2017-04-27 | Discharge: 2017-04-27 | Disposition: A | Discharge status: Home / Self Care | Payer: Medicaid Other | Source: Ambulatory Visit | Attending: Cardiology | Admitting: Cardiology

## 2017-04-27 DIAGNOSIS — I130 Hypertensive heart and chronic kidney disease with heart failure and stage 1 through stage 4 chronic kidney disease, or unspecified chronic kidney disease: Secondary | ICD-10-CM | POA: Diagnosis not present

## 2017-04-27 DIAGNOSIS — I5042 Chronic combined systolic (congestive) and diastolic (congestive) heart failure: Secondary | ICD-10-CM

## 2017-04-27 LAB — GLUCOSE, CAPILLARY: GLUCOSE-CAPILLARY: 219 mg/dL — AB (ref 65–99)

## 2017-04-27 NOTE — Progress Notes (Signed)
Incomplete Session Note  Patient Details  Name: Marco [name] MRN: [id number] Date of Birth: Mar 06, 1956 Referring Provider:     CARDIAC REHAB PHASE II ORIENTATION from 04/23/2017 in [place]  Referring Provider  Marco Him MD      [name] did not complete his rehab session.  [name] complained of feeling lightheaded this afternoon. [name] ate some [place] toast and oatmeal around 12;30. Patient given water.Sitting blood pressure 137/85 sitting. Standing blood pressure 139/79 standing. [name] is not going to exercise.today. [name] to get some lunch. [name] was advised to eat something prior to coming to exercise. Patient states understanding and plans to return to exercise on Wednesday.Barnet Pall, RN,BSN 04/27/2017 3:15 PM

## 2017-04-29 ENCOUNTER — Ambulatory Visit (HOSPITAL_COMMUNITY): Payer: Medicaid Other

## 2017-04-29 ENCOUNTER — Encounter (HOSPITAL_COMMUNITY)
Admission: RE | Admit: 2017-04-29 | Discharge: 2017-04-29 | Disposition: A | Discharge status: Home / Self Care | Payer: Medicaid Other | Source: Ambulatory Visit | Attending: Cardiology | Admitting: Cardiology

## 2017-04-29 DIAGNOSIS — I130 Hypertensive heart and chronic kidney disease with heart failure and stage 1 through stage 4 chronic kidney disease, or unspecified chronic kidney disease: Secondary | ICD-10-CM | POA: Diagnosis not present

## 2017-04-29 DIAGNOSIS — I5042 Chronic combined systolic (congestive) and diastolic (congestive) heart failure: Secondary | ICD-10-CM

## 2017-04-29 LAB — GLUCOSE, CAPILLARY
GLUCOSE-CAPILLARY: 242 mg/dL — AB (ref 65–99)
Glucose-Capillary: 199 mg/dL — ABNORMAL HIGH (ref 65–99)

## 2017-04-29 NOTE — Progress Notes (Signed)
Daily Session Note  Patient Details  Name: Marco [name] MRN: [id number] Date of Birth: [birth year] Referring Provider:     [facility] from 04/23/2017 in [place]  Referring Provider  [name] MD      Encounter Date: 04/29/2017  Check In:   Capillary Blood Glucose: Results for orders placed or performed during the hospital encounter of 04/29/17 (from the past 24 hour(s))  Glucose, capillary     Status: Abnormal   Collection Time: 04/29/17  3:17 PM  Result Value Ref Range   Glucose-Capillary 242 (H) 65 - 99 mg/dL      History  Smoking Status  . Former Smoker  . Packs/day: 0.50  . Years: 10.00  . Types: Cigarettes  . Quit date: 04/11/2008  Smokeless Tobacco  . Never Used    Goals Met:  Exercise tolerated well  Goals Unmet:  Not Applicable  Comments: [name] started cardiac rehab today.  Pt tolerated light exercise without difficulty. VSS, telemetry-Sinus Rhythm, asymptomatic.  Medication list reconciled. Pt denies barriers to medicaiton compliance.  PSYCHOSOCIAL ASSESSMENT:  PHQ-1.[name] says he feels depressed at times but denies being depressed currently. [name] says he has chronic pain all over and takes hydrocodone to help with pain control Pt exhibits positive coping skills, hopeful outlook with supportive family. No psychosocial needs identified at this time, no psychosocial interventions necessary.    Pt enjoys participating in activities at church..   Pt oriented to exercise equipment and routine.    Understanding verbalized. [name] hopes coming to cardiac rehab will make him feel stronger and help him to loose weight.Barnet Pall, RN,BSN 04/29/2017 4:52 PM   Dr. [name] is Medical Director for Cardiac Rehab at [facility].

## 2017-04-30 NOTE — Progress Notes (Signed)
QUALITY OF LIFE SCORE REVIEW  [name] completed Quality of Life survey as a participant in Cardiac Rehab. Scores 21.0 or below are considered low. Pt score very low in several areas Overall 19.01, Health and Function 12.77, socioeconomic 22.14, physiological and spiritual 21.43, family 30.00. Patient quality of life slightly altered by physical constraints which limits ability to perform as prior to recent cardiac illness.  Marco [name] is dissatisfied with his health due to his diagnosis of CHF, diabetes and chronic pain.  Offered emotional support and reassurance.  Will continue to monitor and intervene as necessary.  Marco [name] hopes that coming to cardiac rehab will help him become more active and lose weight. Marco [name] also said he is dissatisfied with his fiances due to being on a fixed income.Will forward [name]'s quality of life questionnaire to Dr Theodosia Blender office for review.Barnet Pall, RN,BSN 04/30/2017 12:08 PM

## 2017-05-01 ENCOUNTER — Ambulatory Visit (HOSPITAL_COMMUNITY): Payer: Medicaid Other

## 2017-05-01 ENCOUNTER — Encounter (HOSPITAL_COMMUNITY)
Admission: RE | Admit: 2017-05-01 | Discharge: 2017-05-01 | Disposition: A | Discharge status: Home / Self Care | Payer: Medicaid Other | Source: Ambulatory Visit | Attending: Cardiology | Admitting: Cardiology

## 2017-05-01 DIAGNOSIS — I130 Hypertensive heart and chronic kidney disease with heart failure and stage 1 through stage 4 chronic kidney disease, or unspecified chronic kidney disease: Secondary | ICD-10-CM | POA: Diagnosis not present

## 2017-05-01 DIAGNOSIS — I5042 Chronic combined systolic (congestive) and diastolic (congestive) heart failure: Secondary | ICD-10-CM

## 2017-05-01 LAB — GLUCOSE, CAPILLARY
GLUCOSE-CAPILLARY: 282 mg/dL — AB (ref 65–99)
Glucose-Capillary: 257 mg/dL — ABNORMAL HIGH (ref 65–99)

## 2017-05-04 ENCOUNTER — Encounter: Payer: Self-pay | Admitting: Internal Medicine

## 2017-05-04 ENCOUNTER — Encounter (HOSPITAL_COMMUNITY)
Admission: RE | Admit: 2017-05-04 | Discharge: 2017-05-04 | Disposition: A | Discharge status: Home / Self Care | Payer: Medicaid Other | Source: Ambulatory Visit | Attending: Cardiology | Admitting: Cardiology

## 2017-05-04 ENCOUNTER — Ambulatory Visit (HOSPITAL_COMMUNITY): Payer: Medicaid Other

## 2017-05-04 DIAGNOSIS — E1122 Type 2 diabetes mellitus with diabetic chronic kidney disease: Secondary | ICD-10-CM | POA: Diagnosis not present

## 2017-05-04 DIAGNOSIS — I130 Hypertensive heart and chronic kidney disease with heart failure and stage 1 through stage 4 chronic kidney disease, or unspecified chronic kidney disease: Secondary | ICD-10-CM | POA: Diagnosis not present

## 2017-05-04 DIAGNOSIS — I5042 Chronic combined systolic (congestive) and diastolic (congestive) heart failure: Secondary | ICD-10-CM | POA: Diagnosis not present

## 2017-05-04 DIAGNOSIS — N183 Chronic kidney disease, stage 3 (moderate): Secondary | ICD-10-CM | POA: Diagnosis not present

## 2017-05-04 NOTE — Progress Notes (Signed)
I am receiving BS readings from cardiac rehab nurse on this pt. Readings elevated. Will have pt f/u with our clinical pharmacist for adjustments in med.

## 2017-05-04 NOTE — Progress Notes (Addendum)
Incomplete Session Note  Patient Details  Name: Marco [name] MRN: [id number] Date of Birth: 10/11/1955 Referring Provider:     CARDIAC REHAB PHASE II ORIENTATION from 04/23/2017 in [place]  Referring Provider  [name] MD      Marco [name] did not complete his rehab session.  Marco [name] reported that he had some chest discomfort at home this morning. Marco [name] rated his discomfort a 3 on a 1-10 scale. Marco [name] came into cardiac rehab complaining of chest discomfort and shortness of breath.Marco [name] said breathing hear made his chest hurt.Upon assessment a few scattered expiratory wheezes present. Oxygen saturation 96% on room air. No peripheral edema noted. 12 lead ECG obtained. Sitting blood pressure 108/60. Standing blood pressure 120/84. Telemetry rhythm Sinus rate 85. Marco [name] did not exercise today at cardiac rehab and did say that his chest discomfort went away after resting. Marco Sours PA called and notified. Marco [name] reviewed today's 12 lead ECG and said there were some twave changes that where present in [name] previous 12 lead ECG. Bryson Corona said that [name] can resume exercise at cardiac rehab on his next scheduled visit. Marco [name] said if [name] has any further continued complaints of chest pain he may need follow up at Dr Theodosia Blender office in the near future. Marco [name] had no further complaints of shortness of breath or chest pain upon exit from cardiac rehab. Will continue to monitor the patient throughout  the program. Will fax exercise flow sheets to Dr. Theodosia Blender office for review with today's ECG tracings.Barnet Pall, RN,BSN 05/04/2017 4:49 PM

## 2017-05-06 ENCOUNTER — Encounter (HOSPITAL_COMMUNITY)
Admission: RE | Admit: 2017-05-06 | Discharge: 2017-05-06 | Disposition: A | Discharge status: Home / Self Care | Payer: Medicaid Other | Source: Ambulatory Visit | Attending: Cardiology | Admitting: Cardiology

## 2017-05-06 ENCOUNTER — Ambulatory Visit (HOSPITAL_COMMUNITY): Payer: Medicaid Other

## 2017-05-06 DIAGNOSIS — I5042 Chronic combined systolic (congestive) and diastolic (congestive) heart failure: Secondary | ICD-10-CM

## 2017-05-06 DIAGNOSIS — I130 Hypertensive heart and chronic kidney disease with heart failure and stage 1 through stage 4 chronic kidney disease, or unspecified chronic kidney disease: Secondary | ICD-10-CM | POA: Diagnosis not present

## 2017-05-06 LAB — GLUCOSE, CAPILLARY: Glucose-Capillary: 170 mg/dL — ABNORMAL HIGH (ref 65–99)

## 2017-05-08 ENCOUNTER — Telehealth: Payer: Self-pay | Admitting: Internal Medicine

## 2017-05-08 ENCOUNTER — Ambulatory Visit (HOSPITAL_COMMUNITY): Payer: Medicaid Other

## 2017-05-08 ENCOUNTER — Encounter (HOSPITAL_COMMUNITY)
Admission: RE | Admit: 2017-05-08 | Discharge: 2017-05-08 | Disposition: A | Discharge status: Home / Self Care | Payer: Medicaid Other | Source: Ambulatory Visit | Attending: Cardiology | Admitting: Cardiology

## 2017-05-08 DIAGNOSIS — I130 Hypertensive heart and chronic kidney disease with heart failure and stage 1 through stage 4 chronic kidney disease, or unspecified chronic kidney disease: Secondary | ICD-10-CM | POA: Diagnosis not present

## 2017-05-08 DIAGNOSIS — I5042 Chronic combined systolic (congestive) and diastolic (congestive) heart failure: Secondary | ICD-10-CM

## 2017-05-08 NOTE — Telephone Encounter (Signed)
Started message in error.-tr

## 2017-05-11 ENCOUNTER — Telehealth (HOSPITAL_COMMUNITY): Payer: Self-pay | Admitting: Internal Medicine

## 2017-05-11 ENCOUNTER — Encounter (HOSPITAL_COMMUNITY): Payer: Medicaid Other

## 2017-05-11 ENCOUNTER — Ambulatory Visit (HOSPITAL_COMMUNITY): Payer: Medicaid Other

## 2017-05-12 ENCOUNTER — Ambulatory Visit: Payer: Medicaid Other | Admitting: Internal Medicine

## 2017-05-12 ENCOUNTER — Encounter: Payer: Self-pay | Admitting: Internal Medicine

## 2017-05-12 ENCOUNTER — Ambulatory Visit: Payer: Medicaid Other | Attending: Internal Medicine | Admitting: Internal Medicine

## 2017-05-12 VITALS — BP 111/71 | HR 79 | Temp 98.8°F | Resp 18 | Ht 66.0 in | Wt 223.0 lb

## 2017-05-12 DIAGNOSIS — Z8711 Personal history of peptic ulcer disease: Secondary | ICD-10-CM | POA: Insufficient documentation

## 2017-05-12 DIAGNOSIS — E118 Type 2 diabetes mellitus with unspecified complications: Secondary | ICD-10-CM

## 2017-05-12 DIAGNOSIS — Z8249 Family history of ischemic heart disease and other diseases of the circulatory system: Secondary | ICD-10-CM | POA: Insufficient documentation

## 2017-05-12 DIAGNOSIS — R1310 Dysphagia, unspecified: Secondary | ICD-10-CM

## 2017-05-12 DIAGNOSIS — E11319 Type 2 diabetes mellitus with unspecified diabetic retinopathy without macular edema: Secondary | ICD-10-CM | POA: Diagnosis not present

## 2017-05-12 DIAGNOSIS — R202 Paresthesia of skin: Secondary | ICD-10-CM

## 2017-05-12 DIAGNOSIS — I481 Persistent atrial fibrillation: Secondary | ICD-10-CM | POA: Insufficient documentation

## 2017-05-12 DIAGNOSIS — G4733 Obstructive sleep apnea (adult) (pediatric): Secondary | ICD-10-CM | POA: Insufficient documentation

## 2017-05-12 DIAGNOSIS — E1142 Type 2 diabetes mellitus with diabetic polyneuropathy: Secondary | ICD-10-CM | POA: Insufficient documentation

## 2017-05-12 DIAGNOSIS — Z801 Family history of malignant neoplasm of trachea, bronchus and lung: Secondary | ICD-10-CM | POA: Diagnosis not present

## 2017-05-12 DIAGNOSIS — N183 Chronic kidney disease, stage 3 (moderate): Secondary | ICD-10-CM | POA: Diagnosis not present

## 2017-05-12 DIAGNOSIS — G8929 Other chronic pain: Secondary | ICD-10-CM | POA: Insufficient documentation

## 2017-05-12 DIAGNOSIS — L409 Psoriasis, unspecified: Secondary | ICD-10-CM | POA: Insufficient documentation

## 2017-05-12 DIAGNOSIS — I2510 Atherosclerotic heart disease of native coronary artery without angina pectoris: Secondary | ICD-10-CM | POA: Insufficient documentation

## 2017-05-12 DIAGNOSIS — Z825 Family history of asthma and other chronic lower respiratory diseases: Secondary | ICD-10-CM | POA: Diagnosis not present

## 2017-05-12 DIAGNOSIS — I130 Hypertensive heart and chronic kidney disease with heart failure and stage 1 through stage 4 chronic kidney disease, or unspecified chronic kidney disease: Secondary | ICD-10-CM | POA: Insufficient documentation

## 2017-05-12 DIAGNOSIS — E785 Hyperlipidemia, unspecified: Secondary | ICD-10-CM | POA: Insufficient documentation

## 2017-05-12 DIAGNOSIS — M791 Myalgia, unspecified site: Secondary | ICD-10-CM | POA: Insufficient documentation

## 2017-05-12 DIAGNOSIS — I129 Hypertensive chronic kidney disease with stage 1 through stage 4 chronic kidney disease, or unspecified chronic kidney disease: Secondary | ICD-10-CM | POA: Diagnosis not present

## 2017-05-12 DIAGNOSIS — Z833 Family history of diabetes mellitus: Secondary | ICD-10-CM | POA: Insufficient documentation

## 2017-05-12 DIAGNOSIS — E1122 Type 2 diabetes mellitus with diabetic chronic kidney disease: Secondary | ICD-10-CM | POA: Insufficient documentation

## 2017-05-12 DIAGNOSIS — F329 Major depressive disorder, single episode, unspecified: Secondary | ICD-10-CM | POA: Insufficient documentation

## 2017-05-12 DIAGNOSIS — Z7982 Long term (current) use of aspirin: Secondary | ICD-10-CM | POA: Diagnosis not present

## 2017-05-12 DIAGNOSIS — M1A9XX0 Chronic gout, unspecified, without tophus (tophi): Secondary | ICD-10-CM | POA: Insufficient documentation

## 2017-05-12 DIAGNOSIS — Z794 Long term (current) use of insulin: Secondary | ICD-10-CM | POA: Diagnosis not present

## 2017-05-12 DIAGNOSIS — M069 Rheumatoid arthritis, unspecified: Secondary | ICD-10-CM | POA: Insufficient documentation

## 2017-05-12 DIAGNOSIS — F5101 Primary insomnia: Secondary | ICD-10-CM | POA: Insufficient documentation

## 2017-05-12 DIAGNOSIS — E669 Obesity, unspecified: Secondary | ICD-10-CM | POA: Insufficient documentation

## 2017-05-12 DIAGNOSIS — I5042 Chronic combined systolic (congestive) and diastolic (congestive) heart failure: Secondary | ICD-10-CM | POA: Insufficient documentation

## 2017-05-12 DIAGNOSIS — Z6835 Body mass index (BMI) 35.0-35.9, adult: Secondary | ICD-10-CM | POA: Insufficient documentation

## 2017-05-12 DIAGNOSIS — Z87891 Personal history of nicotine dependence: Secondary | ICD-10-CM | POA: Insufficient documentation

## 2017-05-12 DIAGNOSIS — Z79899 Other long term (current) drug therapy: Secondary | ICD-10-CM | POA: Diagnosis not present

## 2017-05-12 DIAGNOSIS — R1319 Other dysphagia: Secondary | ICD-10-CM

## 2017-05-12 NOTE — Patient Instructions (Addendum)
I have referred you for second opinion on your swallowing problem.    Try using Solonpos over the counter on your feet.

## 2017-05-12 NOTE — Progress Notes (Signed)
Patient ID: Marco [name], male    DOB: 1956-04-08  MRN: [id number]  CC: Foot Pain   Subjective: Marco [name] is a 61 y.o. male who presents for UC visit His concerns today include:  61 year old male with history of HTN, diabetes type 2 with neuropathy, CKD stage III, obesity, nonobstructive CAD, chronic combined CHF, OSA on CPAP, RA followed by rheumatology in [facility]   1.  Dysphagia: still has problems swallowing problems gagging at times with liquids and solids. Has to partially regur to get it to go back down -saw Dr. Marina Goodell with [facility]: Reported recent work up included esophagram. This was normal. He also underwent modified barium swallow with speech pathology. This was unremarkable. Suspected dysmotility due to chronic narcotics.   -pt wanting a second opinion.  On last visit I mth ago, I recommend stopping narcotics for 1 wk to see if this makes a difference.  However, pt states he got to thinking about it and recalls this was going on even before he was on narcotics. Had endoscopy in [place] about 4 yrs ago and told that it was ok.  2. C/o soreness, tenderness and numbness in feet. -unable to sleep with feet under cover at nights because of increase sensitivity. -numbness on toes for mths -told by podiatrist that likely neuropathy but pt does not think it is. On Lyrica for neuropathy which has resolved the burning which was his usual symptom associated with neuropathy -taking Lunesta which she feels this does not really help him fall asleep. Takes about 1- 1.5 hr to fall asleep but once a sleep "I sleep hard." Goes to bed at various times. He turns off all lights and puts his CPAP  mask on   3. Doing cardiac rehab and BS check during those visits. Some BS have been high. A.m BS before BF 100-130; before lunch 160-180s. Compliant with Metformin, Levemir and Novolog SSI. BS before BF was 108; ate about 1 hr ago and now BS 180  Patient Active Problem List   Diagnosis Date  Noted  . Dysphagia 01/09/2017  . Hyperlipidemia 12/23/2016  . Chronic combined systolic and diastolic CHF (congestive heart failure) (HCC) 09/09/2016  . Diabetes mellitus with complication (HCC)   . Diabetic retinopathy (HCC) 07/22/2016  . Diabetic polyneuropathy associated with type 2 diabetes mellitus (HCC) 06/10/2016  . Myalgia and myositis 03/31/2016  . Dyspnea on exertion 02/09/2016  . Abdominal bloating 02/05/2016  . Chronic gouty arthritis 11/29/2015  . Ache in joint 11/27/2015  . LBP (low back pain) 11/27/2015  . Arthralgia of multiple joints 11/27/2015  . Psoriasis 11/27/2015  . Breast pain in male 11/14/2015  . Sinusitis, chronic 10/16/2015  . Insomnia 10/16/2015  . Adult BMI 30+ 10/04/2015  . New onset of headaches after age 35 09/20/2015  . OSA (obstructive sleep apnea) 07/12/2015  . Low serum testosterone level 05/18/2015  . Depression 05/16/2015  . Chronic pain   . CAD (coronary artery disease) 12/24/2014  . Persistent atrial fibrillation (HCC) 12/24/2014  . Essential hypertension 12/24/2014  . CKD (chronic kidney disease) stage 3, GFR 30-59 ml/min (HCC) 12/24/2014  . PUD (peptic ulcer disease) 12/24/2014  . DM2 (diabetes mellitus, type 2) (HCC) 12/24/2014     Current Outpatient Prescriptions on File Prior to Visit  Medication Sig Dispense Refill  . ACCU-CHEK AVIVA PLUS test strip USE ONE STRIP TO CHECK BLOOD GLUCOSE 3 TIMES DAILY 100 each 12  . ACCU-CHEK FASTCLIX LANCETS MISC Use a directed to check blood  sugar 3 times daily. 100 each 11  . albuterol (PROVENTIL HFA;VENTOLIN HFA) 108 (90 Base) MCG/ACT inhaler Inhale 2 puffs into the lungs every 6 (six) hours as needed for wheezing or shortness of breath. 1 Inhaler 2  . aspirin (EQ ASPIRIN ADULT LOW DOSE) 81 MG EC tablet Take 1 tablet (81 mg total) by mouth daily. Swallow whole. 90 tablet 3  . atorvastatin (LIPITOR) 40 MG tablet Take 1 tablet (40 mg total) by mouth daily. 90 tablet 3  . BD PEN NEEDLE NANO U/F 32G X  4 MM MISC USE ONE PEN NEEDLE THREE TIMES DAILY 100 each 11  . Blood Glucose Monitoring Suppl (ACCU-CHEK AVIVA PLUS) w/Device KIT 1 Device by Does not apply route 3 (three) times daily after meals. E11.9 1 kit 0  . Blood Pressure KIT 1 each by Does not apply route 2 (two) times daily. 1 each 0  . Eszopiclone 3 MG TABS Take 1 tablet (3 mg total) by mouth at bedtime. Take immediately before bedtime 30 tablet 5  . fluorometholone (FML) 0.1 % ophthalmic suspension Place 1 drop into both eyes 3 (three) times daily.    . furosemide (LASIX) 40 MG tablet Take 1 tablet by mouth every morning (Patient taking differently: Take 40 mg by mouth every morning. Take 1 tablet by mouth every morning) 90 tablet 3  . glucose blood (ACCU-CHEK AVIVA PLUS) test strip 1 each by Other route 3 (three) times daily. E11.9 100 each 12  . HYDROcodone-acetaminophen (NORCO/VICODIN) 5-325 MG tablet Take 1 tablet by mouth every 8 (eight) hours as needed for moderate pain. Per pain management    . insulin aspart (NOVOLOG FLEXPEN) 100 UNIT/ML FlexPen Inject 14-24 Units into the skin 3 (three) times daily with meals. 15 mL 5  . Insulin Detemir (LEVEMIR FLEXTOUCH) 100 UNIT/ML Pen Inject 50 Units into the skin daily at 10 pm. 45 mL 3  . leflunomide (ARAVA) 10 MG tablet Take 10 mg by mouth daily.    [name] losartan (COZAAR) 50 MG tablet Take 50 mg by mouth daily.    . metFORMIN (GLUCOPHAGE-XR) 500 MG 24 hr tablet Take 2 tablets (1,000 mg total) by mouth 2 (two) times daily. 360 tablet 3  . predniSONE (DELTASONE) 10 MG tablet Take 1 tablet (10 mg total) by mouth daily with breakfast. 90 tablet 3  . pregabalin (LYRICA) 100 MG capsule Take 1 capsule (100 mg total) by mouth 3 (three) times daily. 90 capsule 5  . ranitidine (ZANTAC) 150 MG tablet Take 1 tablet (150 mg total) by mouth at bedtime. (Patient taking differently: Take 150 mg by mouth daily before breakfast. ) 90 tablet 3  . Secukinumab (COSENTYX) 150 MG/ML SOSY Inject 150 mg into the skin  every 28 (twenty-eight) days.      No current facility-administered medications on file prior to visit.     No Known Allergies  Social History   Social History  . Marital status: Married    Spouse name: Amethyst  . Number of children: 8  . Years of education: 44   Occupational History  .      disabled   Social History Main Topics  . Smoking status: Former Smoker    Packs/day: 0.50    Years: 10.00    Types: Cigarettes    Quit date: 04/11/2008  . Smokeless tobacco: Never Used  . Alcohol use No  . Drug use: No  . Sexual activity: Yes    Partners: Female    Birth control/ protection:  None   Other Topics Concern  . Not on file   Social History Narrative   Lives with wife. Does not work.  On disability.    Caffeine- coffee, 1/2 cup daily    Family History  Problem Relation Age of Onset  . Hypertension Mother   . Diabetes Mother   . COPD Mother   . Lung cancer Mother        Smoker   . Hypertension Father   . Diabetes Father   . Heart Problems Father   . COPD Father   . Colon cancer Neg Hx   . Stomach cancer Neg Hx     Past Surgical History:  Procedure Laterality Date  . CARDIAC CATHETERIZATION  05/2014   ablation for atrial fibrillation  . CARDIAC CATHETERIZATION N/A 11/16/2015   Procedure: Left Heart Cath and Coronary Angiography;  Surgeon: Leonie Man, MD;  Location: [facility];  Service: Cardiovascular;  Laterality: N/A;  . COLON RESECTION  09/2013   due to large, abnormal polpy. non cancerous per patient.   . COLON SURGERY  09/2014   colon resection     ROS: Review of Systems Negative except as stated above PHYSICAL EXAM: BP 111/71 (BP Location: Left Arm, Patient Position: Sitting, Cuff Size: Normal)   Pulse 79   Temp 98.8 F (37.1 C) (Oral)   Resp 18   Ht '5\' 6"'$  (1.676 m)   Wt 223 lb (101.2 kg)   SpO2 96%   BMI 35.99 kg/m   Wt Readings from Last 3 Encounters:  05/12/17 223 lb (101.2 kg)  04/23/17 225 lb 5 oz (102.2 kg)  04/17/17  223 lb 12.8 oz (101.5 kg)    Physical Exam General appearance - alert, well appearing, and in no distress Mental status - alert, oriented to person, place, and time, normal mood, behavior, speech, dress, motor activity, and thought processes Extremities -no LE edema Diabetic Foot Exam - Simple   Simple Foot Form Visual Inspection See comments:  Yes Sensation Testing Intact to touch and monofilament testing bilaterally:  Yes Pulse Check Posterior Tibialis and Dorsalis pulse intact bilaterally:  Yes Comments Toenails are overgrown on a few of the nails on both sides.     ASSESSMENT AND PLAN: 1. Esophageal dysphagia - Ambulatory referral to Gastroenterology for second opinion. ? Dysmotility related to narcotics vs DM dysmotility vs dysmotility from rheumatologic issue. Advise pt to ask his rheumatologist about that  2. Diabetic peripheral neuropathy ([place]) -On highest dose of Lyrica. We discussed adding another agent like amitriptyline but we both agree that he is already on enough medications. He will try over-the-counter salon positive - Vitamin B12  3. Primary insomnia Good sleep hygiene discussed and encouraged  4. Diabetes mellitus with complication (HCC)   5. Paresthesia of foot, bilateral - TSH  HM: decline strep and tdap  Patient was given the opportunity to ask questions.  Patient verbalized understanding of the plan and was able to repeat key elements of the plan.   Orders Placed This Encounter  Procedures  . Vitamin B12  . TSH  . Ambulatory referral to Gastroenterology     Requested Prescriptions    No prescriptions requested or ordered in this encounter    No Follow-up on file.  Karle Plumber, MD, FACP

## 2017-05-13 ENCOUNTER — Encounter (HOSPITAL_COMMUNITY)
Admission: RE | Admit: 2017-05-13 | Discharge: 2017-05-13 | Disposition: A | Discharge status: Home / Self Care | Payer: Medicaid Other | Source: Ambulatory Visit | Attending: Cardiology | Admitting: Cardiology

## 2017-05-13 ENCOUNTER — Ambulatory Visit (HOSPITAL_COMMUNITY): Payer: Medicaid Other

## 2017-05-13 DIAGNOSIS — I110 Hypertensive heart disease with heart failure: Secondary | ICD-10-CM | POA: Diagnosis present

## 2017-05-13 DIAGNOSIS — N183 Chronic kidney disease, stage 3 (moderate): Secondary | ICD-10-CM | POA: Diagnosis not present

## 2017-05-13 DIAGNOSIS — I130 Hypertensive heart and chronic kidney disease with heart failure and stage 1 through stage 4 chronic kidney disease, or unspecified chronic kidney disease: Secondary | ICD-10-CM | POA: Diagnosis not present

## 2017-05-13 DIAGNOSIS — I5042 Chronic combined systolic (congestive) and diastolic (congestive) heart failure: Secondary | ICD-10-CM | POA: Diagnosis not present

## 2017-05-13 DIAGNOSIS — Z6838 Body mass index (BMI) 38.0-38.9, adult: Secondary | ICD-10-CM | POA: Insufficient documentation

## 2017-05-13 DIAGNOSIS — E1122 Type 2 diabetes mellitus with diabetic chronic kidney disease: Secondary | ICD-10-CM | POA: Diagnosis not present

## 2017-05-13 DIAGNOSIS — Z87891 Personal history of nicotine dependence: Secondary | ICD-10-CM | POA: Diagnosis not present

## 2017-05-13 DIAGNOSIS — Z794 Long term (current) use of insulin: Secondary | ICD-10-CM | POA: Diagnosis not present

## 2017-05-13 DIAGNOSIS — Z79899 Other long term (current) drug therapy: Secondary | ICD-10-CM | POA: Diagnosis not present

## 2017-05-13 LAB — VITAMIN B12: Vitamin B-12: 338 pg/mL (ref 232–1245)

## 2017-05-13 LAB — TSH: TSH: 3.15 u[IU]/mL (ref 0.450–4.500)

## 2017-05-15 ENCOUNTER — Ambulatory Visit (HOSPITAL_COMMUNITY): Payer: Medicaid Other

## 2017-05-15 ENCOUNTER — Encounter (HOSPITAL_COMMUNITY): Payer: Medicaid Other

## 2017-05-18 ENCOUNTER — Encounter (HOSPITAL_COMMUNITY): Payer: Medicaid Other

## 2017-05-18 ENCOUNTER — Ambulatory Visit (HOSPITAL_COMMUNITY): Payer: Medicaid Other

## 2017-05-19 NOTE — Progress Notes (Signed)
Cardiac Individual Treatment Plan  Patient Details  Name: Marco [name] MRN: [id number] Date of Birth: 1955-11-07 Referring Provider:     CARDIAC REHAB PHASE II ORIENTATION from 04/23/2017 in [place]  Referring Provider  [name] MD      Initial Encounter Date:    CARDIAC REHAB PHASE II ORIENTATION from 04/23/2017 in [place]  Date  04/23/17  Referring Provider  [name] MD      Visit Diagnosis: Heart failure, systolic and diastolic, chronic ([place])  Patient's Home Medications on Admission:  Current Outpatient Prescriptions:  .  ACCU-CHEK AVIVA PLUS test strip, USE ONE STRIP TO CHECK BLOOD GLUCOSE 3 TIMES DAILY, Disp: 100 each, Rfl: 12 .  ACCU-CHEK FASTCLIX LANCETS MISC, Use a directed to check blood sugar 3 times daily., Disp: 100 each, Rfl: 11 .  albuterol (PROVENTIL HFA;VENTOLIN HFA) 108 (90 Base) MCG/ACT inhaler, Inhale 2 puffs into the lungs every 6 (six) hours as needed for wheezing or shortness of breath., Disp: 1 Inhaler, Rfl: 2 .  aspirin (EQ ASPIRIN ADULT LOW DOSE) 81 MG EC tablet, Take 1 tablet (81 mg total) by mouth daily. Swallow whole., Disp: 90 tablet, Rfl: 3 .  atorvastatin (LIPITOR) 40 MG tablet, Take 1 tablet (40 mg total) by mouth daily., Disp: 90 tablet, Rfl: 3 .  BD PEN NEEDLE NANO U/F 32G X 4 MM MISC, USE ONE PEN NEEDLE THREE TIMES DAILY, Disp: 100 each, Rfl: 11 .  Blood Glucose Monitoring Suppl (ACCU-CHEK AVIVA PLUS) w/Device KIT, 1 Device by Does not apply route 3 (three) times daily after meals. E11.9, Disp: 1 kit, Rfl: 0 .  Blood Pressure KIT, 1 each by Does not apply route 2 (two) times daily., Disp: 1 each, Rfl: 0 .  Eszopiclone 3 MG TABS, Take 1 tablet (3 mg total) by mouth at bedtime. Take immediately before bedtime, Disp: 30 tablet, Rfl: 5 .  fluorometholone (FML) 0.1 % ophthalmic suspension, Place 1 drop into both eyes 3 (three) times daily., Disp: , Rfl:  .  furosemide (LASIX)  40 MG tablet, Take 1 tablet by mouth every morning (Patient taking differently: Take 40 mg by mouth every morning. Take 1 tablet by mouth every morning), Disp: 90 tablet, Rfl: 3 .  glucose blood (ACCU-CHEK AVIVA PLUS) test strip, 1 each by Other route 3 (three) times daily. E11.9, Disp: 100 each, Rfl: 12 .  HYDROcodone-acetaminophen (NORCO/VICODIN) 5-325 MG tablet, Take 1 tablet by mouth every 8 (eight) hours as needed for moderate pain. Per pain management, Disp: , Rfl:  .  insulin aspart (NOVOLOG FLEXPEN) 100 UNIT/ML FlexPen, Inject 14-24 Units into the skin 3 (three) times daily with meals., Disp: 15 mL, Rfl: 5 .  Insulin Detemir (LEVEMIR FLEXTOUCH) 100 UNIT/ML Pen, Inject 50 Units into the skin daily at 10 pm., Disp: 45 mL, Rfl: 3 .  leflunomide (ARAVA) 10 MG tablet, Take 10 mg by mouth daily., Disp: , Rfl:  .  losartan (COZAAR) 50 MG tablet, Take 50 mg by mouth daily., Disp: , Rfl:  .  metFORMIN (GLUCOPHAGE-XR) 500 MG 24 hr tablet, Take 2 tablets (1,000 mg total) by mouth 2 (two) times daily., Disp: 360 tablet, Rfl: 3 .  predniSONE (DELTASONE) 10 MG tablet, Take 1 tablet (10 mg total) by mouth daily with breakfast., Disp: 90 tablet, Rfl: 3 .  pregabalin (LYRICA) 100 MG capsule, Take 1 capsule (100 mg total) by mouth 3 (three) times daily., Disp: 90 capsule, Rfl: 5 .  ranitidine (ZANTAC) 150 MG tablet, Take 1 tablet (150 mg total) by mouth at bedtime. (Patient taking differently: Take 150 mg by mouth daily before breakfast. ), Disp: 90 tablet, Rfl: 3 .  Secukinumab (COSENTYX) 150 MG/ML SOSY, Inject 150 mg into the skin every 28 (twenty-eight) days. , Disp: , Rfl:   Past Medical History: Past Medical History:  Diagnosis Date  . Benign colon polyp 08/01/2013   [facility] in [place]. large base tranverse colon polyp was biopsied. polyp was benign with minimal surface hyperplastic change.  . Chronic combined systolic and diastolic CHF (congestive heart failure) ([place])   . Chronic pain    . CKD (chronic kidney disease), stage III ([place])   . Diabetes mellitus without complication ([place])   . Diverticulosis   . Esophageal hiatal hernia 07/29/2013   confirmed on EGD   . Esophageal stricture   . Essential hypertension   . Gastritis 07/29/2013   confirmed on EGD, bx done an negative for intestinal metaplasia, dsyplasia or H. pylori. normal gastric emptying study done 07/13/2013.  [name] GERD (gastroesophageal reflux disease)   . Morbid obesity ([place])   . Non-obstructive CAD    a. 02/2013 Cath ([place]): nonobs dzs;  b. 09/2014 Myoview ([place]): EF 55%, no ischemia;  c. 12/2014 Echo: EF 50-55%, gr1 DD, no effusion.  . Persistent atrial fibrillation ([place])    a. 02/2013 s/p rfca in [place], [place]-->prev on Xarelto, d/c'd 2/2 anemia, ? GIB.  [name] Rheumatoid arthritis ([place])   . Sigmoid diverticulosis 08/01/2013   confirmed on colonscopy. record scanned into chart    Tobacco Use: History  Smoking Status  . Former Smoker  . Packs/day: 0.50  . Years: 10.00  . Types: Cigarettes  . Quit date: 04/11/2008  Smokeless Tobacco  . Never Used    Labs: Recent Review Flowsheet Data    Labs for ITP Cardiac and Pulmonary Rehab Latest Ref Rng & Units 09/02/2016 09/02/2016 12/04/2016 03/05/2017 04/17/2017   Cholestrol 100 - 199 mg/dL - - - - 104   LDLCALC 0 - 99 mg/dL - - - - 33   HDL >39 mg/dL - - - - 36(L)   Trlycerides 0 - 149 mg/dL - - - - 176(H)   Hemoglobin A1c - - 7.5(H) 6.9 7.6 -   TCO2 0 - 100 mmol/L 24 - - - -      Capillary Blood Glucose: Lab Results  Component Value Date   GLUCAP 170 (H) 05/06/2017   GLUCAP 257 (H) 05/01/2017   GLUCAP 282 (H) 05/01/2017   GLUCAP 199 (H) 04/29/2017   GLUCAP 242 (H) 04/29/2017     Exercise Target Goals:    Exercise Program Goal: Individual exercise prescription set with THRR, safety & activity barriers. Participant demonstrates ability to understand and report RPE using BORG scale, to self-measure pulse accurately, and to acknowledge the importance of the  exercise prescription.  Exercise Prescription Goal: Starting with aerobic activity 30 plus minutes a day, 3 days per week for initial exercise prescription. Provide home exercise prescription and guidelines that participant acknowledges understanding prior to discharge.  Activity Barriers & Risk Stratification:     Activity Barriers & Cardiac Risk Stratification - 04/23/17 1547      Activity Barriers & Cardiac Risk Stratification   Activity Barriers Arthritis;Back Problems;Deconditioning;Joint Problems;Muscular Weakness;Other (comment);Shortness of Breath      6 Minute Walk:     6 Minute Walk    Row Name 04/23/17 1530  6 Minute Walk   Phase Initial     Distance 1107 feet     Walk Time 6 minutes     # of Rest Breaks 0     MPH 2.09     METS 2.4     RPE 13     VO2 Peak 8.54     Symptoms Yes (comment)     Comments fatigue!     Resting HR 87 bpm     Resting BP 104/60     Resting Oxygen Saturation  96 %     Exercise Oxygen Saturation  during 6 min walk 95 %     Max Ex. HR 101 bpm     Max Ex. BP 128/72     2 Minute Post BP 116/64        Oxygen Initial Assessment:   Oxygen Re-Evaluation:   Oxygen Discharge (Final Oxygen Re-Evaluation):   Initial Exercise Prescription:     Initial Exercise Prescription - 04/23/17 1500      Date of Initial Exercise RX and Referring Provider   Date 04/23/17   Referring Provider [name] MD     Bike   Level 0.5   Minutes 10   METs 1.94     NuStep   Level 2   SPM 75   Minutes 10   METs 1.8     Track   Laps 7   Minutes 10   METs 2.23     Prescription Details   Frequency (times per week) 3   Duration Progress to 30 minutes of continuous aerobic without signs/symptoms of physical distress     Intensity   THRR 40-80% of Max Heartrate 63-126   Ratings of Perceived Exertion 11-13   Perceived Dyspnea 0-4     Progression   Progression Continue to progress workloads to maintain intensity without  signs/symptoms of physical distress.     Resistance Training   Training Prescription Yes   Weight 2lbs   Reps 10-15      Perform Capillary Blood Glucose checks as needed.  Exercise Prescription Changes:      Exercise Prescription Changes    Row Name 05/01/17 1600 05/13/17 1500 05/15/17 1600         Response to Exercise   Blood Pressure (Admit) 108/76 100/62 -     Blood Pressure (Exercise) 122/68 122/68 -     Blood Pressure (Exit) 108/70 100/70 -     Heart Rate (Admit) 93 bpm 92 bpm -     Heart Rate (Exercise) 115 bpm 110 bpm -     Heart Rate (Exit) 93 bpm 92 bpm -     Rating of Perceived Exertion (Exercise) 13 11 -     Symptoms none none -     Duration Progress to 30 minutes of  aerobic without signs/symptoms of physical distress Progress to 30 minutes of  aerobic without signs/symptoms of physical distress -     Intensity THRR unchanged THRR unchanged -       Progression   Progression Continue to progress workloads to maintain intensity without signs/symptoms of physical distress. Continue to progress workloads to maintain intensity without signs/symptoms of physical distress. -     Average METs 1.6 2.2 -       Resistance Training   Training Prescription Yes Yes -     Weight 2lbs 2lbs -     Reps 10-15 10-15 -     Time 10 Minutes 10 Minutes -  Interval Training   Interval Training No No -       Bike   Level -  -  -     Minutes -  -  -     METs -  -  -       Recumbant Bike   Level 1 1 -     Minutes 10 15 -     METs 1.5 2 -       NuStep   Level 2 2 -     SPM 75 75 -     Minutes 10 15 -     METs 1.8 2.5 -       Track   Laps 3  - -     Minutes 10  - -     METs 1.52  - -        Exercise Comments:      Exercise Comments    Row Name 05/01/17 1621 05/11/17 1528         Exercise Comments Off to a good start with exercise. Reviewed METs with patient. Increasing workloads as tolerated.         Exercise Goals and Review:      Exercise  Goals    Row Name 04/23/17 1546             Exercise Goals   Increase Physical Activity Yes       Intervention Provide advice, education, support and counseling about physical activity/exercise needs.;Develop an individualized exercise prescription for aerobic and resistive training based on initial evaluation findings, risk stratification, comorbidities and participant's personal goals.       Expected Outcomes Achievement of increased cardiorespiratory fitness and enhanced flexibility, muscular endurance and strength shown through measurements of functional capacity and personal statement of participant.       Increase Strength and Stamina Yes       Intervention Provide advice, education, support and counseling about physical activity/exercise needs.;Develop an individualized exercise prescription for aerobic and resistive training based on initial evaluation findings, risk stratification, comorbidities and participant's personal goals.       Expected Outcomes Achievement of increased cardiorespiratory fitness and enhanced flexibility, muscular endurance and strength shown through measurements of functional capacity and personal statement of participant.       Able to understand and use rate of perceived exertion (RPE) scale Yes       Intervention Provide education and explanation on how to use RPE scale       Expected Outcomes Short Term: Able to use RPE daily in rehab to express subjective intensity level;Long Term:  Able to use RPE to guide intensity level when exercising independently       Knowledge and understanding of Target Heart Rate Range (THRR) Yes       Intervention Provide education and explanation of THRR including how the numbers were predicted and where they are located for reference       Expected Outcomes Short Term: Able to state/look up THRR;Long Term: Able to use THRR to govern intensity when exercising independently;Short Term: Able to use daily as guideline for intensity in  rehab       Able to check pulse independently Yes       Intervention Provide education and demonstration on how to check pulse in carotid and radial arteries.;Review the importance of being able to check your own pulse for safety during independent exercise       Expected Outcomes Short Term: Able to explain  why pulse checking is important during independent exercise;Long Term: Able to check pulse independently and accurately       Understanding of Exercise Prescription Yes       Intervention Provide education, explanation, and written materials on patient's individual exercise prescription       Expected Outcomes Short Term: Able to explain program exercise prescription;Long Term: Able to explain home exercise prescription to exercise independently          Exercise Goals Re-Evaluation :     Exercise Goals Re-Evaluation    Row Name 05/13/17 1527             Exercise Goal Re-Evaluation   Exercise Goals Review Increase Physical Activity;Able to understand and use rate of perceived exertion (RPE) scale       Comments Making slow progress with exercise. Patient is able to understand and use RPE scale appropriately.       Expected Outcomes Increase workloads as tolerated.           Discharge Exercise Prescription (Final Exercise Prescription Changes):     Exercise Prescription Changes - 05/15/17 1600      Response to Exercise   Blood Pressure (Admit) --   Blood Pressure (Exercise) --   Blood Pressure (Exit) --   Heart Rate (Admit) --   Heart Rate (Exercise) --   Heart Rate (Exit) --   Rating of Perceived Exertion (Exercise) --   Symptoms --   Duration --   Intensity --     Progression   Progression --   Average METs --     Resistance Training   Training Prescription --   Weight --   Reps --   Time --     Interval Training   Interval Training --     Recumbant Bike   Level --   Minutes --   METs --     NuStep   Level --   SPM --   Minutes --   METs --      Track   Laps --   Minutes --   METs --      Nutrition:  Target Goals: Understanding of nutrition guidelines, daily intake of sodium <153m, cholesterol <2071m calories 30% from fat and 7% or less from saturated fats, daily to have 5 or more servings of fruits and vegetables.  Biometrics:     Pre Biometrics - 04/23/17 1622      Pre Biometrics   Height 5' 5.5" (1.664 m)   Weight 225 lb 5 oz (102.2 kg)   Waist Circumference 49 inches   Hip Circumference 43 inches   Waist to Hip Ratio 1.14 %   BMI (Calculated) 36.91   Triceps Skinfold 43 mm   % Body Fat 39.9 %   Grip Strength 45 kg   Single Leg Stand 5 seconds       Nutrition Therapy Plan and Nutrition Goals:     Nutrition Therapy & Goals - 04/23/17 1451      Nutrition Therapy   Diet Carb Modified, Therapeutic Lifestyle Changes     Personal Nutrition Goals   Nutrition Goal Pt to identify food quantities necessary to achieve weight loss of 6-24 lb (2.7-10.9 kg) at graduation from cardiac rehab. Goal wt of 180-190 lb desired.    Personal Goal #2 CBG concentrations in the normal range or as close to normal as is safely possible.     Intervention Plan   Intervention Prescribe, educate and counsel regarding individualized specific  dietary modifications aiming towards targeted core components such as weight, hypertension, lipid management, diabetes, heart failure and other comorbidities.;Nutrition handout(s) given to patient.  Conservator, museum/gallery given   Expected Outcomes Short Term Goal: Understand basic principles of dietary content, such as calories, fat, sodium, cholesterol and nutrients.;Long Term Goal: Adherence to prescribed nutrition plan.      Nutrition Discharge: Nutrition Scores:     Nutrition Assessments - 04/23/17 1451      MEDFICTS Scores   Pre Score 48      Nutrition Goals Re-Evaluation:   Nutrition Goals Re-Evaluation:   Nutrition Goals Discharge (Final Nutrition Goals  Re-Evaluation):   Psychosocial: Target Goals: Acknowledge presence or absence of significant depression and/or stress, maximize coping skills, provide positive support system. Participant is able to verbalize types and ability to use techniques and skills needed for reducing stress and depression.  Initial Review & Psychosocial Screening:     Initial Psych Review & Screening - 04/23/17 1615      Initial Review   Current issues with None Identified     Family Dynamics   Good Support System? Yes     Barriers   Psychosocial barriers to participate in program There are no identifiable barriers or psychosocial needs.     Screening Interventions   Interventions Encouraged to exercise      Quality of Life Scores:     Quality of Life - 04/23/17 1547      Quality of Life Scores   Health/Function Pre 12.77 %   Socioeconomic Pre 22.14 %   Psych/Spiritual Pre 21.43 %   Family Pre 30 %   GLOBAL Pre 19.01 %      PHQ-9: Recent Review Flowsheet Data    Depression screen [facility] 2/9 05/12/2017 04/27/2017 04/17/2017 03/23/2017 03/16/2017   Decreased Interest 1 0 0 1 0   Down, Depressed, Hopeless _0 PHQ - 2 Score _1 Altered sleeping 0 - _2 Tired, decreased energy 1 - 0 1 1   Change in appetite 0 - 0 0 0   Feeling bad or failure about yourself  0 - 0 0 0   Trouble concentrating 0 - 0 0 0   Moving slowly or fidgety/restless 0 - 0 0 0   Suicidal thoughts 0 - 0 0 0   PHQ-9 Score 3 - _3 Interpretation of Total Score  Total Score Depression Severity:  1-4 = Minimal depression, 5-9 = Mild depression, 10-14 = Moderate depression, 15-19 = Moderately severe depression, 20-27 = Severe depression   Psychosocial Evaluation and Intervention:   Psychosocial Re-Evaluation:     Psychosocial Re-Evaluation    Row Name 05/19/17 1453             Psychosocial Re-Evaluation   Current issues with Current Stress Concerns       Comments health and financial related        Interventions Stress management education;Encouraged to attend Cardiac Rehabilitation for the exercise       Comments -  [name] recently had to give up his automobile. the plan is to help [name] will out a scat application on [id number]         Initial Review   Source of Stress Concerns Chronic Illness;Financial          Psychosocial Discharge (Final Psychosocial Re-Evaluation):     Psychosocial Re-Evaluation - 05/19/17 1453  Psychosocial Re-Evaluation   Current issues with Current Stress Concerns   Comments health and financial related   Interventions Stress management education;Encouraged to attend Cardiac Rehabilitation for the exercise   Comments --  [name] recently had to give up his automobile. the plan is to help [name] will out a scat application on [id number]/96/7893     Initial Review   Source of Stress Concerns Chronic Illness;Financial      Vocational Rehabilitation: Provide vocational rehab assistance to qualifying candidates.   Vocational Rehab Evaluation & Intervention:     Vocational Rehab - 04/23/17 1608      Initial Vocational Rehab Evaluation & Intervention   Assessment shows need for Vocational Rehabilitation No  Mr Gundrum is disabled and not interested in vocational rehab for job retraining at this time      Education: Education Goals: Education classes will be provided on a weekly basis, covering required topics. Participant will state understanding/return demonstration of topics presented.  Learning Barriers/Preferences:     Learning Barriers/Preferences - 04/23/17 1529      Learning Barriers/Preferences   Learning Barriers Sight   Learning Preferences Skilled Demonstration;Verbal Instruction;Video      Education Topics: Count Your Pulse:  -Group instruction provided by verbal instruction, demonstration, patient participation and written materials to support subject.  Instructors address importance of being able to find your pulse and how  to count your pulse when at home without a heart monitor.  Patients get hands on experience counting their pulse with staff help and individually.   Heart Attack, Angina, and Risk Factor Modification:  -Group instruction provided by verbal instruction, video, and written materials to support subject.  Instructors address signs and symptoms of angina and heart attacks.    Also discuss risk factors for heart disease and how to make changes to improve heart health risk factors.   CARDIAC REHAB PHASE II EXERCISE from 05/13/2017 in [place]  Date  04/29/17  Educator  Andi Hence RN  Instruction Review Code  2- meets goals/outcomes      Functional Fitness:  -Group instruction provided by verbal instruction, demonstration, patient participation, and written materials to support subject.  Instructors address safety measures for doing things around the house.  Discuss how to get up and down off the floor, how to pick things up properly, how to safely get out of a chair without assistance, and balance training.   CARDIAC REHAB PHASE II EXERCISE from 05/13/2017 in [place]  Date  05/01/17  Educator  EP  Instruction Review Code  2- meets goals/outcomes      Meditation and Mindfulness:  -Group instruction provided by verbal instruction, patient participation, and written materials to support subject.  Instructor addresses importance of mindfulness and meditation practice to help reduce stress and improve awareness.  Instructor also leads participants through a meditation exercise.    CARDIAC REHAB PHASE II EXERCISE from 05/13/2017 in [place]  Date  05/13/17  Instruction Review Code  2- meets goals/outcomes      Stretching for Flexibility and Mobility:  -Group instruction provided by verbal instruction, patient participation, and written materials to support subject.  Instructors lead participants  through series of stretches that are designed to increase flexibility thus improving mobility.  These stretches are additional exercise for major muscle groups that are typically performed during regular warm up and cool down.   Hands Only CPR:  -Group verbal, video, and participation provides a  basic overview of AHA guidelines for community CPR. Role-play of emergencies allow participants the opportunity to practice calling for help and chest compression technique with discussion of AED use.   Hypertension: -Group verbal and written instruction that provides a basic overview of hypertension including the most recent diagnostic guidelines, risk factor reduction with self-care instructions and medication management.   CARDIAC REHAB PHASE II EXERCISE from 05/13/2017 in [place]  Date  05/08/17  Instruction Review Code  2- meets goals/outcomes       Nutrition I class: Heart Healthy Eating:  -Group instruction provided by PowerPoint slides, verbal discussion, and written materials to support subject matter. The instructor gives an explanation and review of the Therapeutic Lifestyle Changes diet recommendations, which includes a discussion on lipid goals, dietary fat, sodium, fiber, plant stanol/sterol esters, sugar, and the components of a well-balanced, healthy diet.   Nutrition II class: Lifestyle Skills:  -Group instruction provided by PowerPoint slides, verbal discussion, and written materials to support subject matter. The instructor gives an explanation and review of label reading, grocery shopping for heart health, heart healthy recipe modifications, and ways to make healthier choices when eating out.   Diabetes Question & Answer:  -Group instruction provided by PowerPoint slides, verbal discussion, and written materials to support subject matter. The instructor gives an explanation and review of diabetes co-morbidities, pre- and post-prandial blood  glucose goals, pre-exercise blood glucose goals, signs, symptoms, and treatment of hypoglycemia and hyperglycemia, and foot care basics.   Diabetes Blitz:  -Group instruction provided by PowerPoint slides, verbal discussion, and written materials to support subject matter. The instructor gives an explanation and review of the physiology behind type 1 and type 2 diabetes, diabetes medications and rational behind using different medications, pre- and post-prandial blood glucose recommendations and Hemoglobin A1c goals, diabetes diet, and exercise including blood glucose guidelines for exercising safely.    Portion Distortion:  -Group instruction provided by PowerPoint slides, verbal discussion, written materials, and food models to support subject matter. The instructor gives an explanation of serving size versus portion size, changes in portions sizes over the last 20 years, and what consists of a serving from each food group.   Stress Management:  -Group instruction provided by verbal instruction, video, and written materials to support subject matter.  Instructors review role of stress in heart disease and how to cope with stress positively.     Exercising on Your Own:  -Group instruction provided by verbal instruction, power point, and written materials to support subject.  Instructors discuss benefits of exercise, components of exercise, frequency and intensity of exercise, and end points for exercise.  Also discuss use of nitroglycerin and activating EMS.  Review options of places to exercise outside of rehab.  Review guidelines for sex with heart disease.   Cardiac Drugs I:  -Group instruction provided by verbal instruction and written materials to support subject.  Instructor reviews cardiac drug classes: antiplatelets, anticoagulants, beta blockers, and statins.  Instructor discusses reasons, side effects, and lifestyle considerations for each drug class.   Cardiac Drugs II:  -Group  instruction provided by verbal instruction and written materials to support subject.  Instructor reviews cardiac drug classes: angiotensin converting enzyme inhibitors (ACE-I), angiotensin II receptor blockers (ARBs), nitrates, and calcium channel blockers.  Instructor discusses reasons, side effects, and lifestyle considerations for each drug class.   Anatomy and Physiology of the Circulatory System:  Group verbal and written instruction and models provide basic cardiac anatomy and physiology,  with the coronary electrical and arterial systems. Review of: AMI, Angina, Valve disease, Heart Failure, Peripheral Artery Disease, Cardiac Arrhythmia, Pacemakers, and the ICD.   CARDIAC REHAB PHASE II EXERCISE from 05/13/2017 in [place]  Date  05/06/17  Instruction Review Code  2- meets goals/outcomes      Other Education:  -Group or individual verbal, written, or video instructions that support the educational goals of the cardiac rehab program.   Knowledge Questionnaire Score:     Knowledge Questionnaire Score - 04/23/17 1545      Knowledge Questionnaire Score   Pre Score 22/24      Core Components/Risk Factors/Patient Goals at Admission:     Personal Goals and Risk Factors at Admission - 04/23/17 1627      Core Components/Risk Factors/Patient Goals on Admission    Weight Management Yes;Weight Maintenance;Obesity   Intervention Weight Management: Develop a combined nutrition and exercise program designed to reach desired caloric intake, while maintaining appropriate intake of nutrient and fiber, sodium and fats, and appropriate energy expenditure required for the weight goal.;Weight Management: Provide education and appropriate resources to help participant work on and attain dietary goals.;Weight Management/Obesity: Establish reasonable short term and long term weight goals.;Obesity: Provide education and appropriate resources to help participant work on  and attain dietary goals.   Expected Outcomes Short Term: Continue to assess and modify interventions until short term weight is achieved;Long Term: Adherence to nutrition and physical activity/exercise program aimed toward attainment of established weight goal;Weight Loss: Understanding of general recommendations for a balanced deficit meal plan, which promotes 1-2 lb weight loss per week and includes a negative energy balance of [phone] kcal/d;Weight Maintenance: Understanding of the daily nutrition guidelines, which includes 25-35% calories from fat, 7% or less cal from saturated fats, less than 272m cholesterol, less than 1.5gm of sodium, & 5 or more servings of fruits and vegetables daily;Understanding recommendations for meals to include 15-35% energy as protein, 25-35% energy from fat, 35-60% energy from carbohydrates, less than 2073mof dietary cholesterol, 20-35 gm of total fiber daily;Understanding of distribution of calorie intake throughout the day with the consumption of 4-5 meals/snacks   Improve shortness of breath with ADL's Yes   Intervention Provide education, individualized exercise plan and daily activity instruction to help decrease symptoms of SOB with activities of daily living.   Expected Outcomes Short Term: Achieves a reduction of symptoms when performing activities of daily living.   Diabetes Yes   Intervention Provide education about signs/symptoms and action to take for hypo/hyperglycemia.;Provide education about proper nutrition, including hydration, and aerobic/resistive exercise prescription along with prescribed medications to achieve blood glucose in normal ranges: Fasting glucose 65-99 mg/dL   Expected Outcomes Short Term: Participant verbalizes understanding of the signs/symptoms and immediate care of hyper/hypoglycemia, proper foot care and importance of medication, aerobic/resistive exercise and nutrition plan for blood glucose control.;Long Term: Attainment of HbA1C <  7%.   Heart Failure Yes   Intervention Provide a combined exercise and nutrition program that is supplemented with education, support and counseling about heart failure. Directed toward relieving symptoms such as shortness of breath, decreased exercise tolerance, and extremity edema.   Expected Outcomes Improve functional capacity of life;Short term: Daily weights obtained and reported for increase. Utilizing diuretic protocols set by physician.;Short term: Attendance in program 2-3 days a week with increased exercise capacity. Reported lower sodium intake. Reported increased fruit and vegetable intake. Reports medication compliance.;Long term: Adoption of self-care skills and reduction of barriers for  early signs and symptoms recognition and intervention leading to self-care maintenance.   Hypertension Yes   Intervention Provide education on lifestyle modifcations including regular physical activity/exercise, weight management, moderate sodium restriction and increased consumption of fresh fruit, vegetables, and low fat dairy, alcohol moderation, and smoking cessation.;Monitor prescription use compliance.   Expected Outcomes Long Term: Maintenance of blood pressure at goal levels.;Short Term: Continued assessment and intervention until BP is < 140/55m HG in hypertensive participants. < 130/866mHG in hypertensive participants with diabetes, heart failure or chronic kidney disease.   Lipids Yes   Intervention Provide education and support for participant on nutrition & aerobic/resistive exercise along with prescribed medications to achieve LDL <7070mHDL >46m36m Expected Outcomes Short Term: Participant states understanding of desired cholesterol values and is compliant with medications prescribed. Participant is following exercise prescription and nutrition guidelines.;Long Term: Cholesterol controlled with medications as prescribed, with individualized exercise RX and with personalized nutrition plan.  Value goals: LDL < 70mg13mL > 40 mg.   Stress Yes   Intervention Offer individual and/or small group education and counseling on adjustment to heart disease, stress management and health-related lifestyle change. Teach and support self-help strategies.;Refer participants experiencing significant psychosocial distress to appropriate mental health specialists for further evaluation and treatment. When possible, include family members and significant others in education/counseling sessions.   Expected Outcomes Short Term: Participant demonstrates changes in health-related behavior, relaxation and other stress management skills, ability to obtain effective social support, and compliance with psychotropic medications if prescribed.;Long Term: Emotional wellbeing is indicated by absence of clinically significant psychosocial distress or social isolation.      Core Components/Risk Factors/Patient Goals Review:      Goals and Risk Factor Review    Row Name 05/19/17 1510             Core Components/Risk Factors/Patient Goals Review   Personal Goals Review Weight Management/Obesity;Improve shortness of breath with ADL's;Heart Failure;Lipids;Diabetes       Review RonniEdd Arbourlost i kg since he started participating in the program. [name]'s vital signs and CBG's  have been stable.       Expected Outcomes RonniEdd Arbour continue to take his med as presribed and partcipate in the program providing RonniEdd Arbourble to partipate in the program.          Core Components/Risk Factors/Patient Goals at Discharge (Final Review):      Goals and Risk Factor Review - 05/19/17 1510      Core Components/Risk Factors/Patient Goals Review   Personal Goals Review Weight Management/Obesity;Improve shortness of breath with ADL's;Heart Failure;Lipids;Diabetes   Review RonniEdd Arbourlost i kg since he started participating in the program. [name]'s vital signs and CBG's  have been stable.   Expected Outcomes RonniEdd Arbour  continue to take his med as presribed and partcipate in the program providing RonniEdd Arbourble to partipate in the program.      ITP Comments:     ITP Comments    Row Name 04/23/17 1525 04/23/17 1526         ITP Comments Medical Director, Dr. TraciTressia Minerscal Director, Dr. TraciFransico Him    Comments: RonniEdd Arbouraking expected progress toward personal goals after completing 6 sessions. Recommend continued exercise and life style modification education including  stress management and relaxation techniques to decrease cardiac risk profile. RonniEdd Arbour not have transportation currently and want to see if he qualifies for scat transportation. [name] plans to come tomorrow  for assistance in filling out his application.Barnet Pall, RN,BSN 05/19/2017 3:37 PM

## 2017-05-20 ENCOUNTER — Encounter (HOSPITAL_COMMUNITY)
Admission: RE | Admit: 2017-05-20 | Discharge: 2017-05-20 | Disposition: A | Discharge status: Home / Self Care | Payer: Medicaid Other | Source: Ambulatory Visit | Attending: Cardiology | Admitting: Cardiology

## 2017-05-20 ENCOUNTER — Ambulatory Visit (HOSPITAL_COMMUNITY): Payer: Medicaid Other

## 2017-05-20 DIAGNOSIS — I5042 Chronic combined systolic (congestive) and diastolic (congestive) heart failure: Secondary | ICD-10-CM

## 2017-05-22 ENCOUNTER — Ambulatory Visit (HOSPITAL_COMMUNITY): Payer: Medicaid Other

## 2017-05-22 ENCOUNTER — Encounter (HOSPITAL_COMMUNITY): Admission: RE | Admit: 2017-05-22 | Payer: Medicaid Other | Source: Ambulatory Visit

## 2017-05-25 ENCOUNTER — Ambulatory Visit (HOSPITAL_COMMUNITY): Payer: Medicaid Other

## 2017-05-25 ENCOUNTER — Encounter (HOSPITAL_COMMUNITY): Admission: RE | Admit: 2017-05-25 | Payer: Medicaid Other | Source: Ambulatory Visit

## 2017-05-27 ENCOUNTER — Encounter (HOSPITAL_COMMUNITY): Payer: Medicaid Other

## 2017-05-27 ENCOUNTER — Ambulatory Visit (HOSPITAL_COMMUNITY): Payer: Medicaid Other

## 2017-05-29 ENCOUNTER — Encounter (HOSPITAL_COMMUNITY): Payer: Medicaid Other

## 2017-05-29 ENCOUNTER — Ambulatory Visit (HOSPITAL_COMMUNITY): Payer: Medicaid Other

## 2017-06-01 ENCOUNTER — Ambulatory Visit (HOSPITAL_COMMUNITY): Payer: Medicaid Other

## 2017-06-01 ENCOUNTER — Encounter (HOSPITAL_COMMUNITY): Payer: Medicaid Other

## 2017-06-03 ENCOUNTER — Ambulatory Visit (HOSPITAL_COMMUNITY): Payer: Medicaid Other

## 2017-06-03 ENCOUNTER — Encounter (HOSPITAL_COMMUNITY): Payer: Medicaid Other

## 2017-06-05 ENCOUNTER — Encounter (HOSPITAL_COMMUNITY): Admission: RE | Admit: 2017-06-05 | Payer: Medicaid Other | Source: Ambulatory Visit

## 2017-06-05 ENCOUNTER — Ambulatory Visit (HOSPITAL_COMMUNITY): Payer: Medicaid Other

## 2017-06-08 ENCOUNTER — Encounter (HOSPITAL_COMMUNITY): Payer: Medicaid Other

## 2017-06-10 ENCOUNTER — Encounter (HOSPITAL_COMMUNITY): Admission: RE | Admit: 2017-06-10 | Payer: Medicaid Other | Source: Ambulatory Visit

## 2017-06-11 ENCOUNTER — Telehealth (HOSPITAL_COMMUNITY): Payer: Self-pay | Admitting: *Deleted

## 2017-06-11 NOTE — Progress Notes (Signed)
Cardiac Individual Treatment Plan  Patient Details  Name: Marco [name] MRN: [id number] Date of Birth: 1955-11-07 Referring Provider:     CARDIAC REHAB PHASE II ORIENTATION from 04/23/2017 in [place]  Referring Provider  [name] MD      Initial Encounter Date:    CARDIAC REHAB PHASE II ORIENTATION from 04/23/2017 in [place]  Date  04/23/17  Referring Provider  [name] MD      Visit Diagnosis: Heart failure, systolic and diastolic, chronic ([place])  Patient's Home Medications on Admission:  Current Outpatient Prescriptions:  .  ACCU-CHEK AVIVA PLUS test strip, USE ONE STRIP TO CHECK BLOOD GLUCOSE 3 TIMES DAILY, Disp: 100 each, Rfl: 12 .  ACCU-CHEK FASTCLIX LANCETS MISC, Use a directed to check blood sugar 3 times daily., Disp: 100 each, Rfl: 11 .  albuterol (PROVENTIL HFA;VENTOLIN HFA) 108 (90 Base) MCG/ACT inhaler, Inhale 2 puffs into the lungs every 6 (six) hours as needed for wheezing or shortness of breath., Disp: 1 Inhaler, Rfl: 2 .  aspirin (EQ ASPIRIN ADULT LOW DOSE) 81 MG EC tablet, Take 1 tablet (81 mg total) by mouth daily. Swallow whole., Disp: 90 tablet, Rfl: 3 .  atorvastatin (LIPITOR) 40 MG tablet, Take 1 tablet (40 mg total) by mouth daily., Disp: 90 tablet, Rfl: 3 .  BD PEN NEEDLE NANO U/F 32G X 4 MM MISC, USE ONE PEN NEEDLE THREE TIMES DAILY, Disp: 100 each, Rfl: 11 .  Blood Glucose Monitoring Suppl (ACCU-CHEK AVIVA PLUS) w/Device KIT, 1 Device by Does not apply route 3 (three) times daily after meals. E11.9, Disp: 1 kit, Rfl: 0 .  Blood Pressure KIT, 1 each by Does not apply route 2 (two) times daily., Disp: 1 each, Rfl: 0 .  Eszopiclone 3 MG TABS, Take 1 tablet (3 mg total) by mouth at bedtime. Take immediately before bedtime, Disp: 30 tablet, Rfl: 5 .  fluorometholone (FML) 0.1 % ophthalmic suspension, Place 1 drop into both eyes 3 (three) times daily., Disp: , Rfl:  .  furosemide (LASIX)  40 MG tablet, Take 1 tablet by mouth every morning (Patient taking differently: Take 40 mg by mouth every morning. Take 1 tablet by mouth every morning), Disp: 90 tablet, Rfl: 3 .  glucose blood (ACCU-CHEK AVIVA PLUS) test strip, 1 each by Other route 3 (three) times daily. E11.9, Disp: 100 each, Rfl: 12 .  HYDROcodone-acetaminophen (NORCO/VICODIN) 5-325 MG tablet, Take 1 tablet by mouth every 8 (eight) hours as needed for moderate pain. Per pain management, Disp: , Rfl:  .  insulin aspart (NOVOLOG FLEXPEN) 100 UNIT/ML FlexPen, Inject 14-24 Units into the skin 3 (three) times daily with meals., Disp: 15 mL, Rfl: 5 .  Insulin Detemir (LEVEMIR FLEXTOUCH) 100 UNIT/ML Pen, Inject 50 Units into the skin daily at 10 pm., Disp: 45 mL, Rfl: 3 .  leflunomide (ARAVA) 10 MG tablet, Take 10 mg by mouth daily., Disp: , Rfl:  .  losartan (COZAAR) 50 MG tablet, Take 50 mg by mouth daily., Disp: , Rfl:  .  metFORMIN (GLUCOPHAGE-XR) 500 MG 24 hr tablet, Take 2 tablets (1,000 mg total) by mouth 2 (two) times daily., Disp: 360 tablet, Rfl: 3 .  predniSONE (DELTASONE) 10 MG tablet, Take 1 tablet (10 mg total) by mouth daily with breakfast., Disp: 90 tablet, Rfl: 3 .  pregabalin (LYRICA) 100 MG capsule, Take 1 capsule (100 mg total) by mouth 3 (three) times daily., Disp: 90 capsule, Rfl: 5 .  ranitidine (ZANTAC) 150 MG tablet, Take 1 tablet (150 mg total) by mouth at bedtime. (Patient taking differently: Take 150 mg by mouth daily before breakfast. ), Disp: 90 tablet, Rfl: 3 .  Secukinumab (COSENTYX) 150 MG/ML SOSY, Inject 150 mg into the skin every 28 (twenty-eight) days. , Disp: , Rfl:   Past Medical History: Past Medical History:  Diagnosis Date  . Benign colon polyp 08/01/2013   [facility] in [place]. large base tranverse colon polyp was biopsied. polyp was benign with minimal surface hyperplastic change.  . Chronic combined systolic and diastolic CHF (congestive heart failure) ([place])   . Chronic pain    . CKD (chronic kidney disease), stage III ([place])   . Diabetes mellitus without complication ([place])   . Diverticulosis   . Esophageal hiatal hernia 07/29/2013   confirmed on EGD   . Esophageal stricture   . Essential hypertension   . Gastritis 07/29/2013   confirmed on EGD, bx done an negative for intestinal metaplasia, dsyplasia or H. pylori. normal gastric emptying study done 07/13/2013.  [name] GERD (gastroesophageal reflux disease)   . Morbid obesity ([place])   . Non-obstructive CAD    a. 02/2013 Cath ([place]): nonobs dzs;  b. 09/2014 Myoview ([place]): EF 55%, no ischemia;  c. 12/2014 Echo: EF 50-55%, gr1 DD, no effusion.  . Persistent atrial fibrillation ([place])    a. 02/2013 s/p rfca in [place], [place]-->prev on Xarelto, d/c'd 2/2 anemia, ? GIB.  [name] Rheumatoid arthritis ([place])   . Sigmoid diverticulosis 08/01/2013   confirmed on colonscopy. record scanned into chart    Tobacco Use: History  Smoking Status  . Former Smoker  . Packs/day: 0.50  . Years: 10.00  . Types: Cigarettes  . Quit date: 04/11/2008  Smokeless Tobacco  . Never Used    Labs: Recent Review Flowsheet Data    Labs for ITP Cardiac and Pulmonary Rehab Latest Ref Rng & Units 09/02/2016 09/02/2016 12/04/2016 03/05/2017 04/17/2017   Cholestrol 100 - 199 mg/dL - - - - 104   LDLCALC 0 - 99 mg/dL - - - - 33   HDL >39 mg/dL - - - - 36(L)   Trlycerides 0 - 149 mg/dL - - - - 176(H)   Hemoglobin A1c - - 7.5(H) 6.9 7.6 -   TCO2 0 - 100 mmol/L 24 - - - -      Capillary Blood Glucose: Lab Results  Component Value Date   GLUCAP 170 (H) 05/06/2017   GLUCAP 257 (H) 05/01/2017   GLUCAP 282 (H) 05/01/2017   GLUCAP 199 (H) 04/29/2017   GLUCAP 242 (H) 04/29/2017     Exercise Target Goals:    Exercise Program Goal: Individual exercise prescription set with THRR, safety & activity barriers. Participant demonstrates ability to understand and report RPE using BORG scale, to self-measure pulse accurately, and to acknowledge the importance of the  exercise prescription.  Exercise Prescription Goal: Starting with aerobic activity 30 plus minutes a day, 3 days per week for initial exercise prescription. Provide home exercise prescription and guidelines that participant acknowledges understanding prior to discharge.  Activity Barriers & Risk Stratification:     Activity Barriers & Cardiac Risk Stratification - 04/23/17 1547      Activity Barriers & Cardiac Risk Stratification   Activity Barriers Arthritis;Back Problems;Deconditioning;Joint Problems;Muscular Weakness;Other (comment);Shortness of Breath      6 Minute Walk:     6 Minute Walk    Row Name 04/23/17 1530  6 Minute Walk   Phase Initial     Distance 1107 feet     Walk Time 6 minutes     # of Rest Breaks 0     MPH 2.09     METS 2.4     RPE 13     VO2 Peak 8.54     Symptoms Yes (comment)     Comments fatigue!     Resting HR 87 bpm     Resting BP 104/60     Resting Oxygen Saturation  96 %     Exercise Oxygen Saturation  during 6 min walk 95 %     Max Ex. HR 101 bpm     Max Ex. BP 128/72     2 Minute Post BP 116/64        Oxygen Initial Assessment:   Oxygen Re-Evaluation:   Oxygen Discharge (Final Oxygen Re-Evaluation):   Initial Exercise Prescription:     Initial Exercise Prescription - 04/23/17 1500      Date of Initial Exercise RX and Referring Provider   Date 04/23/17   Referring Provider [name] MD     Bike   Level 0.5   Minutes 10   METs 1.94     NuStep   Level 2   SPM 75   Minutes 10   METs 1.8     Track   Laps 7   Minutes 10   METs 2.23     Prescription Details   Frequency (times per week) 3   Duration Progress to 30 minutes of continuous aerobic without signs/symptoms of physical distress     Intensity   THRR 40-80% of Max Heartrate 63-126   Ratings of Perceived Exertion 11-13   Perceived Dyspnea 0-4     Progression   Progression Continue to progress workloads to maintain intensity without  signs/symptoms of physical distress.     Resistance Training   Training Prescription Yes   Weight 2lbs   Reps 10-15      Perform Capillary Blood Glucose checks as needed.  Exercise Prescription Changes:      Exercise Prescription Changes    Row Name 05/01/17 1600 05/13/17 1500 05/15/17 1600         Response to Exercise   Blood Pressure (Admit) 108/76 100/62 -     Blood Pressure (Exercise) 122/68 122/68 -     Blood Pressure (Exit) 108/70 100/70 -     Heart Rate (Admit) 93 bpm 92 bpm -     Heart Rate (Exercise) 115 bpm 110 bpm -     Heart Rate (Exit) 93 bpm 92 bpm -     Rating of Perceived Exertion (Exercise) 13 11 -     Symptoms none none -     Duration Progress to 30 minutes of  aerobic without signs/symptoms of physical distress Progress to 30 minutes of  aerobic without signs/symptoms of physical distress -     Intensity THRR unchanged THRR unchanged -       Progression   Progression Continue to progress workloads to maintain intensity without signs/symptoms of physical distress. Continue to progress workloads to maintain intensity without signs/symptoms of physical distress. -     Average METs 1.6 2.2 -       Resistance Training   Training Prescription Yes Yes -     Weight 2lbs 2lbs -     Reps 10-15 10-15 -     Time 10 Minutes 10 Minutes -  Interval Training   Interval Training No No -       Bike   Level -  -  -     Minutes -  -  -     METs -  -  -       Recumbant Bike   Level 1 1 -     Minutes 10 15 -     METs 1.5 2 -       NuStep   Level 2 2 -     SPM 75 75 -     Minutes 10 15 -     METs 1.8 2.5 -       Track   Laps 3  - -     Minutes 10  - -     METs 1.52  - -        Exercise Comments:      Exercise Comments    Row Name 05/01/17 1621 05/11/17 1528 06/12/17 1621       Exercise Comments Off to a good start with exercise. Reviewed METs with patient. Increasing workloads as tolerated. Patient has been absent due to lack of  transportation.        Exercise Goals and Review:      Exercise Goals    Row Name 04/23/17 1546             Exercise Goals   Increase Physical Activity Yes       Intervention Provide advice, education, support and counseling about physical activity/exercise needs.;Develop an individualized exercise prescription for aerobic and resistive training based on initial evaluation findings, risk stratification, comorbidities and participant's personal goals.       Expected Outcomes Achievement of increased cardiorespiratory fitness and enhanced flexibility, muscular endurance and strength shown through measurements of functional capacity and personal statement of participant.       Increase Strength and Stamina Yes       Intervention Provide advice, education, support and counseling about physical activity/exercise needs.;Develop an individualized exercise prescription for aerobic and resistive training based on initial evaluation findings, risk stratification, comorbidities and participant's personal goals.       Expected Outcomes Achievement of increased cardiorespiratory fitness and enhanced flexibility, muscular endurance and strength shown through measurements of functional capacity and personal statement of participant.       Able to understand and use rate of perceived exertion (RPE) scale Yes       Intervention Provide education and explanation on how to use RPE scale       Expected Outcomes Short Term: Able to use RPE daily in rehab to express subjective intensity level;Long Term:  Able to use RPE to guide intensity level when exercising independently       Knowledge and understanding of Target Heart Rate Range (THRR) Yes       Intervention Provide education and explanation of THRR including how the numbers were predicted and where they are located for reference       Expected Outcomes Short Term: Able to state/look up THRR;Long Term: Able to use THRR to govern intensity when exercising  independently;Short Term: Able to use daily as guideline for intensity in rehab       Able to check pulse independently Yes       Intervention Provide education and demonstration on how to check pulse in carotid and radial arteries.;Review the importance of being able to check your own pulse for safety during independent exercise  Expected Outcomes Short Term: Able to explain why pulse checking is important during independent exercise;Long Term: Able to check pulse independently and accurately       Understanding of Exercise Prescription Yes       Intervention Provide education, explanation, and written materials on patient's individual exercise prescription       Expected Outcomes Short Term: Able to explain program exercise prescription;Long Term: Able to explain home exercise prescription to exercise independently          Exercise Goals Re-Evaluation :     Exercise Goals Re-Evaluation    Row Name 05/13/17 1527 06/12/17 1620           Exercise Goal Re-Evaluation   Exercise Goals Review Increase Physical Activity;Able to understand and use rate of perceived exertion (RPE) scale  -      Comments Making slow progress with exercise. Patient is able to understand and use RPE scale appropriately. Patient has been out since 05/13/17 due to lack of transportation, therefore unable to review exercise goals.      Expected Outcomes Increase workloads as tolerated. Resume exercise routine once able to return to CR.          Discharge Exercise Prescription (Final Exercise Prescription Changes):     Exercise Prescription Changes - 05/15/17 1600      Response to Exercise   Blood Pressure (Admit) --   Blood Pressure (Exercise) --   Blood Pressure (Exit) --   Heart Rate (Admit) --   Heart Rate (Exercise) --   Heart Rate (Exit) --   Rating of Perceived Exertion (Exercise) --   Symptoms --   Duration --   Intensity --     Progression   Progression --   Average METs --      Resistance Training   Training Prescription --   Weight --   Reps --   Time --     Interval Training   Interval Training --     Recumbant Bike   Level --   Minutes --   METs --     NuStep   Level --   SPM --   Minutes --   METs --     Track   Laps --   Minutes --   METs --      Nutrition:  Target Goals: Understanding of nutrition guidelines, daily intake of sodium <1553m, cholesterol <2057m calories 30% from fat and 7% or less from saturated fats, daily to have 5 or more servings of fruits and vegetables.  Biometrics:     Pre Biometrics - 04/23/17 1622      Pre Biometrics   Height 5' 5.5" (1.664 m)   Weight 225 lb 5 oz (102.2 kg)   Waist Circumference 49 inches   Hip Circumference 43 inches   Waist to Hip Ratio 1.14 %   BMI (Calculated) 36.91   Triceps Skinfold 43 mm   % Body Fat 39.9 %   Grip Strength 45 kg   Single Leg Stand 5 seconds       Nutrition Therapy Plan and Nutrition Goals:     Nutrition Therapy & Goals - 04/23/17 1451      Nutrition Therapy   Diet Carb Modified, Therapeutic Lifestyle Changes     Personal Nutrition Goals   Nutrition Goal Pt to identify food quantities necessary to achieve weight loss of 6-24 lb (2.7-10.9 kg) at graduation from cardiac rehab. Goal wt of 180-190 lb desired.    Personal Goal #  2 CBG concentrations in the normal range or as close to normal as is safely possible.     Intervention Plan   Intervention Prescribe, educate and counsel regarding individualized specific dietary modifications aiming towards targeted core components such as weight, hypertension, lipid management, diabetes, heart failure and other comorbidities.;Nutrition handout(s) given to patient.  Conservator, museum/gallery given   Expected Outcomes Short Term Goal: Understand basic principles of dietary content, such as calories, fat, sodium, cholesterol and nutrients.;Long Term Goal: Adherence to prescribed nutrition plan.      Nutrition  Discharge: Nutrition Scores:     Nutrition Assessments - 04/23/17 1451      MEDFICTS Scores   Pre Score 48      Nutrition Goals Re-Evaluation:   Nutrition Goals Re-Evaluation:   Nutrition Goals Discharge (Final Nutrition Goals Re-Evaluation):   Psychosocial: Target Goals: Acknowledge presence or absence of significant depression and/or stress, maximize coping skills, provide positive support system. Participant is able to verbalize types and ability to use techniques and skills needed for reducing stress and depression.  Initial Review & Psychosocial Screening:     Initial Psych Review & Screening - 04/23/17 1615      Initial Review   Current issues with None Identified     Family Dynamics   Good Support System? Yes     Barriers   Psychosocial barriers to participate in program There are no identifiable barriers or psychosocial needs.     Screening Interventions   Interventions Encouraged to exercise      Quality of Life Scores:     Quality of Life - 04/23/17 1547      Quality of Life Scores   Health/Function Pre 12.77 %   Socioeconomic Pre 22.14 %   Psych/Spiritual Pre 21.43 %   Family Pre 30 %   GLOBAL Pre 19.01 %      PHQ-9: Recent Review Flowsheet Data    Depression screen [facility] 2/9 05/12/2017 04/27/2017 04/17/2017 03/23/2017 03/16/2017   Decreased Interest 1 0 0 1 0   Down, Depressed, Hopeless _0 PHQ - 2 Score _1 Altered sleeping 0 - _2 Tired, decreased energy 1 - 0 1 1   Change in appetite 0 - 0 0 0   Feeling bad or failure about yourself  0 - 0 0 0   Trouble concentrating 0 - 0 0 0   Moving slowly or fidgety/restless 0 - 0 0 0   Suicidal thoughts 0 - 0 0 0   PHQ-9 Score 3 - _3 Interpretation of Total Score  Total Score Depression Severity:  1-4 = Minimal depression, 5-9 = Mild depression, 10-14 = Moderate depression, 15-19 = Moderately severe depression, 20-27 = Severe depression   Psychosocial Evaluation and  Intervention:   Psychosocial Re-Evaluation:     Psychosocial Re-Evaluation    Row Name 05/19/17 1453             Psychosocial Re-Evaluation   Current issues with Current Stress Concerns       Comments health and financial related       Interventions Stress management education;Encouraged to attend Cardiac Rehabilitation for the exercise       Comments -  Marco [name] recently had to give up his automobile. the plan is to help Marco [name] will out a scat application on 56/25/6389         Initial Review  Source of Stress Concerns Chronic Illness;Financial          Psychosocial Discharge (Final Psychosocial Re-Evaluation):     Psychosocial Re-Evaluation - 05/19/17 1453      Psychosocial Re-Evaluation   Current issues with Current Stress Concerns   Comments health and financial related   Interventions Stress management education;Encouraged to attend Cardiac Rehabilitation for the exercise   Comments --  Marco [name] recently had to give up his automobile. the plan is to help Marco [name] will out a scat application on 16/05/9603     Initial Review   Source of Stress Concerns Chronic Illness;Financial      Vocational Rehabilitation: Provide vocational rehab assistance to qualifying candidates.   Vocational Rehab Evaluation & Intervention:     Vocational Rehab - 04/23/17 1608      Initial Vocational Rehab Evaluation & Intervention   Assessment shows need for Vocational Rehabilitation No  Mr Bagnall is disabled and not interested in vocational rehab for job retraining at this time      Education: Education Goals: Education classes will be provided on a weekly basis, covering required topics. Participant will state understanding/return demonstration of topics presented.  Learning Barriers/Preferences:     Learning Barriers/Preferences - 04/23/17 1529      Learning Barriers/Preferences   Learning Barriers Sight   Learning Preferences Skilled Demonstration;Verbal Instruction;Video       Education Topics: Count Your Pulse:  -Group instruction provided by verbal instruction, demonstration, patient participation and written materials to support subject.  Instructors address importance of being able to find your pulse and how to count your pulse when at home without a heart monitor.  Patients get hands on experience counting their pulse with staff help and individually.   Heart Attack, Angina, and Risk Factor Modification:  -Group instruction provided by verbal instruction, video, and written materials to support subject.  Instructors address signs and symptoms of angina and heart attacks.    Also discuss risk factors for heart disease and how to make changes to improve heart health risk factors.   CARDIAC REHAB PHASE II EXERCISE from 05/13/2017 in [place]  Date  04/29/17  Educator  Andi Hence RN  Instruction Review Code  2- meets goals/outcomes      Functional Fitness:  -Group instruction provided by verbal instruction, demonstration, patient participation, and written materials to support subject.  Instructors address safety measures for doing things around the house.  Discuss how to get up and down off the floor, how to pick things up properly, how to safely get out of a chair without assistance, and balance training.   CARDIAC REHAB PHASE II EXERCISE from 05/13/2017 in [place]  Date  05/01/17  Educator  EP  Instruction Review Code  2- meets goals/outcomes      Meditation and Mindfulness:  -Group instruction provided by verbal instruction, patient participation, and written materials to support subject.  Instructor addresses importance of mindfulness and meditation practice to help reduce stress and improve awareness.  Instructor also leads participants through a meditation exercise.    CARDIAC REHAB PHASE II EXERCISE from 05/13/2017 in [place]  Date  05/13/17   Instruction Review Code  2- meets goals/outcomes      Stretching for Flexibility and Mobility:  -Group instruction provided by verbal instruction, patient participation, and written materials to support subject.  Instructors lead participants through series of stretches that are designed to increase flexibility thus improving mobility.  These stretches are additional exercise for major muscle groups that are typically performed during regular warm up and cool down.   Hands Only CPR:  -Group verbal, video, and participation provides a basic overview of AHA guidelines for community CPR. Role-play of emergencies allow participants the opportunity to practice calling for help and chest compression technique with discussion of AED use.   Hypertension: -Group verbal and written instruction that provides a basic overview of hypertension including the most recent diagnostic guidelines, risk factor reduction with self-care instructions and medication management.   CARDIAC REHAB PHASE II EXERCISE from 05/13/2017 in [place]  Date  05/08/17  Instruction Review Code  2- meets goals/outcomes       Nutrition I class: Heart Healthy Eating:  -Group instruction provided by PowerPoint slides, verbal discussion, and written materials to support subject matter. The instructor gives an explanation and review of the Therapeutic Lifestyle Changes diet recommendations, which includes a discussion on lipid goals, dietary fat, sodium, fiber, plant stanol/sterol esters, sugar, and the components of a well-balanced, healthy diet.   Nutrition II class: Lifestyle Skills:  -Group instruction provided by PowerPoint slides, verbal discussion, and written materials to support subject matter. The instructor gives an explanation and review of label reading, grocery shopping for heart health, heart healthy recipe modifications, and ways to make healthier choices when eating out.   Diabetes  Question & Answer:  -Group instruction provided by PowerPoint slides, verbal discussion, and written materials to support subject matter. The instructor gives an explanation and review of diabetes co-morbidities, pre- and post-prandial blood glucose goals, pre-exercise blood glucose goals, signs, symptoms, and treatment of hypoglycemia and hyperglycemia, and foot care basics.   Diabetes Blitz:  -Group instruction provided by PowerPoint slides, verbal discussion, and written materials to support subject matter. The instructor gives an explanation and review of the physiology behind type 1 and type 2 diabetes, diabetes medications and rational behind using different medications, pre- and post-prandial blood glucose recommendations and Hemoglobin A1c goals, diabetes diet, and exercise including blood glucose guidelines for exercising safely.    Portion Distortion:  -Group instruction provided by PowerPoint slides, verbal discussion, written materials, and food models to support subject matter. The instructor gives an explanation of serving size versus portion size, changes in portions sizes over the last 20 years, and what consists of a serving from each food group.   Stress Management:  -Group instruction provided by verbal instruction, video, and written materials to support subject matter.  Instructors review role of stress in heart disease and how to cope with stress positively.     Exercising on Your Own:  -Group instruction provided by verbal instruction, power point, and written materials to support subject.  Instructors discuss benefits of exercise, components of exercise, frequency and intensity of exercise, and end points for exercise.  Also discuss use of nitroglycerin and activating EMS.  Review options of places to exercise outside of rehab.  Review guidelines for sex with heart disease.   Cardiac Drugs I:  -Group instruction provided by verbal instruction and written materials to  support subject.  Instructor reviews cardiac drug classes: antiplatelets, anticoagulants, beta blockers, and statins.  Instructor discusses reasons, side effects, and lifestyle considerations for each drug class.   Cardiac Drugs II:  -Group instruction provided by verbal instruction and written materials to support subject.  Instructor reviews cardiac drug classes: angiotensin converting enzyme inhibitors (ACE-I), angiotensin II receptor blockers (ARBs), nitrates, and calcium channel blockers.  Instructor discusses  reasons, side effects, and lifestyle considerations for each drug class.   Anatomy and Physiology of the Circulatory System:  Group verbal and written instruction and models provide basic cardiac anatomy and physiology, with the coronary electrical and arterial systems. Review of: AMI, Angina, Valve disease, Heart Failure, Peripheral Artery Disease, Cardiac Arrhythmia, Pacemakers, and the ICD.   CARDIAC REHAB PHASE II EXERCISE from 05/13/2017 in [place]  Date  05/06/17  Instruction Review Code  2- meets goals/outcomes      Other Education:  -Group or individual verbal, written, or video instructions that support the educational goals of the cardiac rehab program.   Knowledge Questionnaire Score:     Knowledge Questionnaire Score - 04/23/17 1545      Knowledge Questionnaire Score   Pre Score 22/24      Core Components/Risk Factors/Patient Goals at Admission:     Personal Goals and Risk Factors at Admission - 04/23/17 1627      Core Components/Risk Factors/Patient Goals on Admission    Weight Management Yes;Weight Maintenance;Obesity   Intervention Weight Management: Develop a combined nutrition and exercise program designed to reach desired caloric intake, while maintaining appropriate intake of nutrient and fiber, sodium and fats, and appropriate energy expenditure required for the weight goal.;Weight Management: Provide education and  appropriate resources to help participant work on and attain dietary goals.;Weight Management/Obesity: Establish reasonable short term and long term weight goals.;Obesity: Provide education and appropriate resources to help participant work on and attain dietary goals.   Expected Outcomes Short Term: Continue to assess and modify interventions until short term weight is achieved;Long Term: Adherence to nutrition and physical activity/exercise program aimed toward attainment of established weight goal;Weight Loss: Understanding of general recommendations for a balanced deficit meal plan, which promotes 1-2 lb weight loss per week and includes a negative energy balance of [phone] kcal/d;Weight Maintenance: Understanding of the daily nutrition guidelines, which includes 25-35% calories from fat, 7% or less cal from saturated fats, less than 237m cholesterol, less than 1.5gm of sodium, & 5 or more servings of fruits and vegetables daily;Understanding recommendations for meals to include 15-35% energy as protein, 25-35% energy from fat, 35-60% energy from carbohydrates, less than 2071mof dietary cholesterol, 20-35 gm of total fiber daily;Understanding of distribution of calorie intake throughout the day with the consumption of 4-5 meals/snacks   Improve shortness of breath with ADL's Yes   Intervention Provide education, individualized exercise plan and daily activity instruction to help decrease symptoms of SOB with activities of daily living.   Expected Outcomes Short Term: Achieves a reduction of symptoms when performing activities of daily living.   Diabetes Yes   Intervention Provide education about signs/symptoms and action to take for hypo/hyperglycemia.;Provide education about proper nutrition, including hydration, and aerobic/resistive exercise prescription along with prescribed medications to achieve blood glucose in normal ranges: Fasting glucose 65-99 mg/dL   Expected Outcomes Short Term:  Participant verbalizes understanding of the signs/symptoms and immediate care of hyper/hypoglycemia, proper foot care and importance of medication, aerobic/resistive exercise and nutrition plan for blood glucose control.;Long Term: Attainment of HbA1C < 7%.   Heart Failure Yes   Intervention Provide a combined exercise and nutrition program that is supplemented with education, support and counseling about heart failure. Directed toward relieving symptoms such as shortness of breath, decreased exercise tolerance, and extremity edema.   Expected Outcomes Improve functional capacity of life;Short term: Daily weights obtained and reported for increase. Utilizing diuretic protocols set by physician.;Short term: Attendance  in program 2-3 days a week with increased exercise capacity. Reported lower sodium intake. Reported increased fruit and vegetable intake. Reports medication compliance.;Long term: Adoption of self-care skills and reduction of barriers for early signs and symptoms recognition and intervention leading to self-care maintenance.   Hypertension Yes   Intervention Provide education on lifestyle modifcations including regular physical activity/exercise, weight management, moderate sodium restriction and increased consumption of fresh fruit, vegetables, and low fat dairy, alcohol moderation, and smoking cessation.;Monitor prescription use compliance.   Expected Outcomes Long Term: Maintenance of blood pressure at goal levels.;Short Term: Continued assessment and intervention until BP is < 140/49m HG in hypertensive participants. < 130/851mHG in hypertensive participants with diabetes, heart failure or chronic kidney disease.   Lipids Yes   Intervention Provide education and support for participant on nutrition & aerobic/resistive exercise along with prescribed medications to achieve LDL <7081mHDL >2m93m Expected Outcomes Short Term: Participant states understanding of desired cholesterol values  and is compliant with medications prescribed. Participant is following exercise prescription and nutrition guidelines.;Long Term: Cholesterol controlled with medications as prescribed, with individualized exercise RX and with personalized nutrition plan. Value goals: LDL < 70mg46mL > 40 mg.   Stress Yes   Intervention Offer individual and/or small group education and counseling on adjustment to heart disease, stress management and health-related lifestyle change. Teach and support self-help strategies.;Refer participants experiencing significant psychosocial distress to appropriate mental health specialists for further evaluation and treatment. When possible, include family members and significant others in education/counseling sessions.   Expected Outcomes Short Term: Participant demonstrates changes in health-related behavior, relaxation and other stress management skills, ability to obtain effective social support, and compliance with psychotropic medications if prescribed.;Long Term: Emotional wellbeing is indicated by absence of clinically significant psychosocial distress or social isolation.      Core Components/Risk Factors/Patient Goals Review:      Goals and Risk Factor Review    Row Name 05/19/17 1510 06/11/17 1457           Core Components/Risk Factors/Patient Goals Review   Personal Goals Review Weight Management/Obesity;Improve shortness of breath with ADL's;Heart Failure;Lipids;Diabetes Weight Management/Obesity;Improve shortness of breath with ADL's;Heart Failure;Lipids;Diabetes      Review Marco Arbourlost i kg since he started participating in the program. [name]'s vital signs and CBG's  have been stable. Marco Arbourbeen out since 05/13/2017 due to transportation issues      Expected Outcomes Marco [name] continue to take his med as presribed and partcipate in the program providing Marco Arbourble to partipate in the program. Marco [name] continue to take his med as presribed and  partcipate in the program providing Marco Arbourble to partipate in the program.         Core Components/Risk Factors/Patient Goals at Discharge (Final Review):      Goals and Risk Factor Review - 06/11/17 1457      Core Components/Risk Factors/Patient Goals Review   Personal Goals Review Weight Management/Obesity;Improve shortness of breath with ADL's;Heart Failure;Lipids;Diabetes   Review Marco Arbourbeen out since 05/13/2017 due to transportation issues   Expected Outcomes Marco [name] continue to take his med as presribed and partcipate in the program providing Marco Arbourble to partipate in the program.      ITP Comments:     ITP Comments    Row Name 04/23/17 1525 04/23/17 1526         ITP Comments Medical Director, Dr. TraciTressia Minerscal Director, Dr. TraciFransico Him  Comments: Marco [name] has not been able to attend due to transportation issues. [name] plans to resume exercise on Monday and get the bus. I faxed a scat application to the [employer] on 05/26/2017.Barnet Pall, RN,BSN 06/12/2017 5:19 PM

## 2017-06-12 ENCOUNTER — Encounter (HOSPITAL_COMMUNITY)
Admission: RE | Admit: 2017-06-12 | Discharge: 2017-06-12 | Disposition: A | Discharge status: Home / Self Care | Payer: Medicaid Other | Source: Ambulatory Visit | Attending: Cardiology | Admitting: Cardiology

## 2017-06-12 DIAGNOSIS — I130 Hypertensive heart and chronic kidney disease with heart failure and stage 1 through stage 4 chronic kidney disease, or unspecified chronic kidney disease: Secondary | ICD-10-CM | POA: Insufficient documentation

## 2017-06-12 DIAGNOSIS — Z6838 Body mass index (BMI) 38.0-38.9, adult: Secondary | ICD-10-CM | POA: Insufficient documentation

## 2017-06-12 DIAGNOSIS — Z79899 Other long term (current) drug therapy: Secondary | ICD-10-CM | POA: Insufficient documentation

## 2017-06-12 DIAGNOSIS — I5042 Chronic combined systolic (congestive) and diastolic (congestive) heart failure: Secondary | ICD-10-CM | POA: Insufficient documentation

## 2017-06-12 DIAGNOSIS — Z87891 Personal history of nicotine dependence: Secondary | ICD-10-CM | POA: Insufficient documentation

## 2017-06-12 DIAGNOSIS — Z794 Long term (current) use of insulin: Secondary | ICD-10-CM | POA: Insufficient documentation

## 2017-06-12 DIAGNOSIS — N183 Chronic kidney disease, stage 3 (moderate): Secondary | ICD-10-CM | POA: Insufficient documentation

## 2017-06-12 DIAGNOSIS — E1122 Type 2 diabetes mellitus with diabetic chronic kidney disease: Secondary | ICD-10-CM | POA: Insufficient documentation

## 2017-06-12 NOTE — Addendum Note (Signed)
Encounter addended by: Sol Passer on: 06/12/2017  4:22 PM<BR>    Actions taken: Flowsheet data copied forward, Visit Navigator Flowsheet section accepted

## 2017-06-15 ENCOUNTER — Encounter (HOSPITAL_COMMUNITY): Payer: Medicaid Other

## 2017-06-17 ENCOUNTER — Encounter (HOSPITAL_COMMUNITY): Payer: Medicaid Other

## 2017-06-19 ENCOUNTER — Encounter (HOSPITAL_COMMUNITY): Payer: Medicaid Other

## 2017-06-22 ENCOUNTER — Encounter (HOSPITAL_COMMUNITY): Payer: Medicaid Other

## 2017-06-23 ENCOUNTER — Ambulatory Visit: Payer: Medicaid Other | Admitting: Internal Medicine

## 2017-06-24 ENCOUNTER — Encounter (HOSPITAL_COMMUNITY): Payer: Medicaid Other

## 2017-06-26 ENCOUNTER — Encounter (HOSPITAL_COMMUNITY): Payer: Medicaid Other

## 2017-06-29 ENCOUNTER — Encounter (HOSPITAL_COMMUNITY): Payer: Medicaid Other

## 2017-07-01 ENCOUNTER — Encounter (HOSPITAL_COMMUNITY): Payer: Medicaid Other

## 2017-07-03 ENCOUNTER — Encounter (HOSPITAL_COMMUNITY): Payer: Medicaid Other

## 2017-07-06 ENCOUNTER — Encounter (HOSPITAL_COMMUNITY): Payer: Medicaid Other

## 2017-07-07 ENCOUNTER — Ambulatory Visit: Payer: Medicaid Other | Admitting: Internal Medicine

## 2017-07-08 ENCOUNTER — Encounter (HOSPITAL_COMMUNITY): Payer: Medicaid Other

## 2017-07-09 ENCOUNTER — Telehealth (HOSPITAL_COMMUNITY): Payer: Self-pay | Admitting: *Deleted

## 2017-07-09 ENCOUNTER — Encounter (HOSPITAL_COMMUNITY): Payer: Self-pay | Admitting: *Deleted

## 2017-07-09 NOTE — Progress Notes (Signed)
Cardiac Individual Treatment Plan  Patient Details  Name: Marco [name] MRN: [id number] Date of Birth: 12-03-1955 Referring Provider:     CARDIAC REHAB PHASE II ORIENTATION from 04/23/2017 in [place]  Referring Provider  [name] MD      Initial Encounter Date:    CARDIAC REHAB PHASE II ORIENTATION from 04/23/2017 in [place]  Date  04/23/17  Referring Provider  [name] MD      Visit Diagnosis: No diagnosis found.  Patient's Home Medications on Admission:  Current Outpatient Medications:  .  ACCU-CHEK AVIVA PLUS test strip, USE ONE STRIP TO CHECK BLOOD GLUCOSE 3 TIMES DAILY, Disp: 100 each, Rfl: 12 .  ACCU-CHEK FASTCLIX LANCETS MISC, Use a directed to check blood sugar 3 times daily., Disp: 100 each, Rfl: 11 .  albuterol (PROVENTIL HFA;VENTOLIN HFA) 108 (90 Base) MCG/ACT inhaler, Inhale 2 puffs into the lungs every 6 (six) hours as needed for wheezing or shortness of breath., Disp: 1 Inhaler, Rfl: 2 .  aspirin (EQ ASPIRIN ADULT LOW DOSE) 81 MG EC tablet, Take 1 tablet (81 mg total) by mouth daily. Swallow whole., Disp: 90 tablet, Rfl: 3 .  atorvastatin (LIPITOR) 40 MG tablet, Take 1 tablet (40 mg total) by mouth daily., Disp: 90 tablet, Rfl: 3 .  BD PEN NEEDLE NANO U/F 32G X 4 MM MISC, USE ONE PEN NEEDLE THREE TIMES DAILY, Disp: 100 each, Rfl: 11 .  Blood Glucose Monitoring Suppl (ACCU-CHEK AVIVA PLUS) w/Device KIT, 1 Device by Does not apply route 3 (three) times daily after meals. E11.9, Disp: 1 kit, Rfl: 0 .  Blood Pressure KIT, 1 each by Does not apply route 2 (two) times daily., Disp: 1 each, Rfl: 0 .  Eszopiclone 3 MG TABS, Take 1 tablet (3 mg total) by mouth at bedtime. Take immediately before bedtime, Disp: 30 tablet, Rfl: 5 .  fluorometholone (FML) 0.1 % ophthalmic suspension, Place 1 drop into both eyes 3 (three) times daily., Disp: , Rfl:  .  furosemide (LASIX) 40 MG tablet, Take 1 tablet by mouth  every morning (Patient taking differently: Take 40 mg by mouth every morning. Take 1 tablet by mouth every morning), Disp: 90 tablet, Rfl: 3 .  glucose blood (ACCU-CHEK AVIVA PLUS) test strip, 1 each by Other route 3 (three) times daily. E11.9, Disp: 100 each, Rfl: 12 .  HYDROcodone-acetaminophen (NORCO/VICODIN) 5-325 MG tablet, Take 1 tablet by mouth every 8 (eight) hours as needed for moderate pain. Per pain management, Disp: , Rfl:  .  insulin aspart (NOVOLOG FLEXPEN) 100 UNIT/ML FlexPen, Inject 14-24 Units into the skin 3 (three) times daily with meals., Disp: 15 mL, Rfl: 5 .  Insulin Detemir (LEVEMIR FLEXTOUCH) 100 UNIT/ML Pen, Inject 50 Units into the skin daily at 10 pm., Disp: 45 mL, Rfl: 3 .  leflunomide (ARAVA) 10 MG tablet, Take 10 mg by mouth daily., Disp: , Rfl:  .  losartan (COZAAR) 50 MG tablet, Take 50 mg by mouth daily., Disp: , Rfl:  .  metFORMIN (GLUCOPHAGE-XR) 500 MG 24 hr tablet, Take 2 tablets (1,000 mg total) by mouth 2 (two) times daily., Disp: 360 tablet, Rfl: 3 .  predniSONE (DELTASONE) 10 MG tablet, Take 1 tablet (10 mg total) by mouth daily with breakfast., Disp: 90 tablet, Rfl: 3 .  pregabalin (LYRICA) 100 MG capsule, Take 1 capsule (100 mg total) by mouth 3 (three) times daily., Disp: 90 capsule, Rfl: 5 .  ranitidine (ZANTAC) 150  MG tablet, Take 1 tablet (150 mg total) by mouth at bedtime. (Patient taking differently: Take 150 mg by mouth daily before breakfast. ), Disp: 90 tablet, Rfl: 3 .  Secukinumab (COSENTYX) 150 MG/ML SOSY, Inject 150 mg into the skin every 28 (twenty-eight) days. , Disp: , Rfl:   Past Medical History: Past Medical History:  Diagnosis Date  . Benign colon polyp 08/01/2013   [facility] in [place]. large base tranverse colon polyp was biopsied. polyp was benign with minimal surface hyperplastic change.  . Chronic combined systolic and diastolic CHF (congestive heart failure) ([place])   . Chronic pain   . CKD (chronic kidney disease),  stage III ([place])   . Diabetes mellitus without complication ([place])   . Diverticulosis   . Esophageal hiatal hernia 07/29/2013   confirmed on EGD   . Esophageal stricture   . Essential hypertension   . Gastritis 07/29/2013   confirmed on EGD, bx done an negative for intestinal metaplasia, dsyplasia or H. pylori. normal gastric emptying study done 07/13/2013.  [name] GERD (gastroesophageal reflux disease)   . Morbid obesity ([place])   . Non-obstructive CAD    a. 02/2013 Cath ([place]): nonobs dzs;  b. 09/2014 Myoview ([place]): EF 55%, no ischemia;  c. 12/2014 Echo: EF 50-55%, gr1 DD, no effusion.  . Persistent atrial fibrillation ([place])    a. 02/2013 s/p rfca in [place], [place]-->prev on Xarelto, d/c'd 2/2 anemia, ? GIB.  [name] Rheumatoid arthritis ([place])   . Sigmoid diverticulosis 08/01/2013   confirmed on colonscopy. record scanned into chart    Tobacco Use: Social History   Tobacco Use  Smoking Status Former Smoker  . Packs/day: 0.50  . Years: 10.00  . Pack years: 5.00  . Types: Cigarettes  . Last attempt to quit: 04/11/2008  . Years since quitting: 9.2  Smokeless Tobacco Never Used    Labs: Recent Chemical engineer    Labs for ITP Cardiac and Pulmonary Rehab Latest Ref Rng & Units 09/02/2016 09/02/2016 12/04/2016 03/05/2017 04/17/2017   Cholestrol 100 - 199 mg/dL - - - - 104   LDLCALC 0 - 99 mg/dL - - - - 33   HDL >39 mg/dL - - - - 36(L)   Trlycerides 0 - 149 mg/dL - - - - 176(H)   Hemoglobin A1c - - 7.5(H) 6.9 7.6 -   TCO2 0 - 100 mmol/L 24 - - - -      Capillary Blood Glucose: Lab Results  Component Value Date   GLUCAP 170 (H) 05/06/2017   GLUCAP 257 (H) 05/01/2017   GLUCAP 282 (H) 05/01/2017   GLUCAP 199 (H) 04/29/2017   GLUCAP 242 (H) 04/29/2017     Exercise Target Goals:    Exercise Program Goal: Individual exercise prescription set with THRR, safety & activity barriers. Participant demonstrates ability to understand and report RPE using BORG scale, to self-measure pulse accurately,  and to acknowledge the importance of the exercise prescription.  Exercise Prescription Goal: Starting with aerobic activity 30 plus minutes a day, 3 days per week for initial exercise prescription. Provide home exercise prescription and guidelines that participant acknowledges understanding prior to discharge.  Activity Barriers & Risk Stratification:   6 Minute Walk:   Oxygen Initial Assessment:   Oxygen Re-Evaluation:   Oxygen Discharge (Final Oxygen Re-Evaluation):   Initial Exercise Prescription:   Perform Capillary Blood Glucose checks as needed.  Exercise Prescription Changes:  Exercise Prescription Changes    Row Name 05/13/17 1500 05/15/17 1600  Response to Exercise   Blood Pressure (Admit)  100/62  -      Blood Pressure (Exercise)  122/68  -      Blood Pressure (Exit)  100/70  -      Heart Rate (Admit)  92 bpm  -      Heart Rate (Exercise)  110 bpm  -      Heart Rate (Exit)  92 bpm  -      Rating of Perceived Exertion (Exercise)  11  -      Symptoms  none  -      Duration  Progress to 30 minutes of  aerobic without signs/symptoms of physical distress  -      Intensity  THRR unchanged  -        Progression   Progression  Continue to progress workloads to maintain intensity without signs/symptoms of physical distress.  -      Average METs  2.2  -        Resistance Training   Training Prescription  Yes  -      Weight  2lbs  -      Reps  10-15  -      Time  10 Minutes  -        Interval Training   Interval Training  No  -        Recumbant Bike   Level  1  -      Minutes  15  -      METs  2  -        NuStep   Level  2  -      SPM  75  -      Minutes  15  -      METs  2.5  -        Track   Laps  -  -      Minutes  -  -      METs  -  -         Exercise Comments:  Exercise Comments    Row Name 05/11/17 1528 06/12/17 1621         Exercise Comments  Increasing workloads as tolerated.  Patient has been absent due to lack of  transportation.         Exercise Goals and Review:   Exercise Goals Re-Evaluation : Exercise Goals Re-Evaluation    Row Name 05/13/17 1527 06/12/17 1620           Exercise Goal Re-Evaluation   Exercise Goals Review  Increase Physical Activity;Able to understand and use rate of perceived exertion (RPE) scale  -      Comments  Making slow progress with exercise. Patient is able to understand and use RPE scale appropriately.  Patient has been out since 05/13/17 due to lack of transportation, therefore unable to review exercise goals.      Expected Outcomes  Increase workloads as tolerated.  Resume exercise routine once able to return to CR.          Discharge Exercise Prescription (Final Exercise Prescription Changes): Exercise Prescription Changes - 05/15/17 1600      Response to Exercise   Blood Pressure (Admit)  --    Blood Pressure (Exercise)  --    Blood Pressure (Exit)  --    Heart Rate (Admit)  --    Heart Rate (Exercise)  --    Heart Rate (Exit)  --  Rating of Perceived Exertion (Exercise)  --    Symptoms  --    Duration  --    Intensity  --      Progression   Progression  --    Average METs  --      Resistance Training   Training Prescription  --    Weight  --    Reps  --    Time  --      Interval Training   Interval Training  --      Recumbant Bike   Level  --    Minutes  --    METs  --      NuStep   Level  --    SPM  --    Minutes  --    METs  --      Track   Laps  --    Minutes  --    METs  --       Nutrition:  Target Goals: Understanding of nutrition guidelines, daily intake of sodium '1500mg'$ , cholesterol '200mg'$ , calories 30% from fat and 7% or less from saturated fats, daily to have 5 or more servings of fruits and vegetables.  Biometrics:    Nutrition Therapy Plan and Nutrition Goals:   Nutrition Discharge: Nutrition Scores:   Nutrition Goals Re-Evaluation:   Nutrition Goals Re-Evaluation:   Nutrition Goals Discharge  (Final Nutrition Goals Re-Evaluation):   Psychosocial: Target Goals: Acknowledge presence or absence of significant depression and/or stress, maximize coping skills, provide positive support system. Participant is able to verbalize types and ability to use techniques and skills needed for reducing stress and depression.  Initial Review & Psychosocial Screening:   Quality of Life Scores:   PHQ-9: Recent Review Flowsheet Data    Depression screen [facility] 2/9 05/12/2017 04/27/2017 04/17/2017 03/23/2017 03/16/2017   Decreased Interest 1 0 0 1 0   Down, Depressed, Hopeless '1 1 1 1 1   '$ PHQ - 2 Score '2 1 1 2 1   '$ Altered sleeping 0 - '1 1 1   '$ Tired, decreased energy 1 - 0 1 1   Change in appetite 0 - 0 0 0   Feeling bad or failure about yourself  0 - 0 0 0   Trouble concentrating 0 - 0 0 0   Moving slowly or fidgety/restless 0 - 0 0 0   Suicidal thoughts 0 - 0 0 0   PHQ-9 Score 3 - '2 4 3     '$ Interpretation of Total Score  Total Score Depression Severity:  1-4 = Minimal depression, 5-9 = Mild depression, 10-14 = Moderate depression, 15-19 = Moderately severe depression, 20-27 = Severe depression   Psychosocial Evaluation and Intervention:   Psychosocial Re-Evaluation: Psychosocial Re-Evaluation    Row Name 05/19/17 1453             Psychosocial Re-Evaluation   Current issues with  Current Stress Concerns       Comments  health and financial related       Interventions  Stress management education;Encouraged to attend Cardiac Rehabilitation for the exercise       Comments  - Marco [name] recently had to give up his automobile. the plan is to help Marco [name] will out a scat application on 50/35/4656         Initial Review   Source of Stress Concerns  Chronic Illness;Financial          Psychosocial Discharge (Final Psychosocial Re-Evaluation): Psychosocial Re-Evaluation - 05/19/17 1453  Psychosocial Re-Evaluation   Current issues with  Current Stress Concerns    Comments  health and  financial related    Interventions  Stress management education;Encouraged to attend Cardiac Rehabilitation for the exercise    Comments  -- Marco [name] recently had to give up his automobile. the plan is to help Marco [name] will out a scat application on 62/83/1517      Initial Review   Source of Stress Concerns  Chronic Illness;Financial       Vocational Rehabilitation: Provide vocational rehab assistance to qualifying candidates.   Vocational Rehab Evaluation & Intervention:   Education: Education Goals: Education classes will be provided on a weekly basis, covering required topics. Participant will state understanding/return demonstration of topics presented.  Learning Barriers/Preferences:   Education Topics: Count Your Pulse:  -Group instruction provided by verbal instruction, demonstration, patient participation and written materials to support subject.  Instructors address importance of being able to find your pulse and how to count your pulse when at home without a heart monitor.  Patients get hands on experience counting their pulse with staff help and individually.   Heart Attack, Angina, and Risk Factor Modification:  -Group instruction provided by verbal instruction, video, and written materials to support subject.  Instructors address signs and symptoms of angina and heart attacks.    Also discuss risk factors for heart disease and how to make changes to improve heart health risk factors.   CARDIAC REHAB PHASE II EXERCISE from 05/13/2017 in [place]  Date  04/29/17  Educator  Andi Hence RN  Instruction Review Code  2- meets goals/outcomes      Functional Fitness:  -Group instruction provided by verbal instruction, demonstration, patient participation, and written materials to support subject.  Instructors address safety measures for doing things around the house.  Discuss how to get up and down off the floor, how to pick things up properly, how  to safely get out of a chair without assistance, and balance training.   CARDIAC REHAB PHASE II EXERCISE from 05/13/2017 in [place]  Date  05/01/17  Educator  EP  Instruction Review Code  2- meets goals/outcomes      Meditation and Mindfulness:  -Group instruction provided by verbal instruction, patient participation, and written materials to support subject.  Instructor addresses importance of mindfulness and meditation practice to help reduce stress and improve awareness.  Instructor also leads participants through a meditation exercise.    CARDIAC REHAB PHASE II EXERCISE from 05/13/2017 in [place]  Date  05/13/17  Instruction Review Code  2- meets goals/outcomes      Stretching for Flexibility and Mobility:  -Group instruction provided by verbal instruction, patient participation, and written materials to support subject.  Instructors lead participants through series of stretches that are designed to increase flexibility thus improving mobility.  These stretches are additional exercise for major muscle groups that are typically performed during regular warm up and cool down.   Hands Only CPR:  -Group verbal, video, and participation provides a basic overview of AHA guidelines for community CPR. Role-play of emergencies allow participants the opportunity to practice calling for help and chest compression technique with discussion of AED use.   Hypertension: -Group verbal and written instruction that provides a basic overview of hypertension including the most recent diagnostic guidelines, risk factor reduction with self-care instructions and medication management.   CARDIAC REHAB PHASE II EXERCISE from 05/13/2017 in [place]  Date  05/08/17  Instruction Review Code  2- meets goals/outcomes       Nutrition I class: Heart Healthy Eating:  -Group instruction provided by PowerPoint  slides, verbal discussion, and written materials to support subject matter. The instructor gives an explanation and review of the Therapeutic Lifestyle Changes diet recommendations, which includes a discussion on lipid goals, dietary fat, sodium, fiber, plant stanol/sterol esters, sugar, and the components of a well-balanced, healthy diet.   Nutrition II class: Lifestyle Skills:  -Group instruction provided by PowerPoint slides, verbal discussion, and written materials to support subject matter. The instructor gives an explanation and review of label reading, grocery shopping for heart health, heart healthy recipe modifications, and ways to make healthier choices when eating out.   Diabetes Question & Answer:  -Group instruction provided by PowerPoint slides, verbal discussion, and written materials to support subject matter. The instructor gives an explanation and review of diabetes co-morbidities, pre- and post-prandial blood glucose goals, pre-exercise blood glucose goals, signs, symptoms, and treatment of hypoglycemia and hyperglycemia, and foot care basics.   Diabetes Blitz:  -Group instruction provided by PowerPoint slides, verbal discussion, and written materials to support subject matter. The instructor gives an explanation and review of the physiology behind type 1 and type 2 diabetes, diabetes medications and rational behind using different medications, pre- and post-prandial blood glucose recommendations and Hemoglobin A1c goals, diabetes diet, and exercise including blood glucose guidelines for exercising safely.    Portion Distortion:  -Group instruction provided by PowerPoint slides, verbal discussion, written materials, and food models to support subject matter. The instructor gives an explanation of serving size versus portion size, changes in portions sizes over the last 20 years, and what consists of a serving from each food group.   Stress Management:  -Group instruction  provided by verbal instruction, video, and written materials to support subject matter.  Instructors review role of stress in heart disease and how to cope with stress positively.     Exercising on Your Own:  -Group instruction provided by verbal instruction, power point, and written materials to support subject.  Instructors discuss benefits of exercise, components of exercise, frequency and intensity of exercise, and end points for exercise.  Also discuss use of nitroglycerin and activating EMS.  Review options of places to exercise outside of rehab.  Review guidelines for sex with heart disease.   Cardiac Drugs I:  -Group instruction provided by verbal instruction and written materials to support subject.  Instructor reviews cardiac drug classes: antiplatelets, anticoagulants, beta blockers, and statins.  Instructor discusses reasons, side effects, and lifestyle considerations for each drug class.   Cardiac Drugs II:  -Group instruction provided by verbal instruction and written materials to support subject.  Instructor reviews cardiac drug classes: angiotensin converting enzyme inhibitors (ACE-I), angiotensin II receptor blockers (ARBs), nitrates, and calcium channel blockers.  Instructor discusses reasons, side effects, and lifestyle considerations for each drug class.   Anatomy and Physiology of the Circulatory System:  Group verbal and written instruction and models provide basic cardiac anatomy and physiology, with the coronary electrical and arterial systems. Review of: AMI, Angina, Valve disease, Heart Failure, Peripheral Artery Disease, Cardiac Arrhythmia, Pacemakers, and the ICD.   CARDIAC REHAB PHASE II EXERCISE from 05/13/2017 in [place]  Date  05/06/17  Instruction Review Code  2- meets goals/outcomes      Other Education:  -Group or individual verbal, written, or video instructions that support the  educational goals of the cardiac rehab  program.   Knowledge Questionnaire Score:   Core Components/Risk Factors/Patient Goals at Admission:   Core Components/Risk Factors/Patient Goals Review:  Goals and Risk Factor Review    Row Name 05/19/17 1510 06/11/17 1457           Core Components/Risk Factors/Patient Goals Review   Personal Goals Review  Weight Management/Obesity;Improve shortness of breath with ADL's;Heart Failure;Lipids;Diabetes  Weight Management/Obesity;Improve shortness of breath with ADL's;Heart Failure;Lipids;Diabetes      Review  Marco [name] has lost i kg since he started participating in the program. Marco [name]'s vital signs and CBG's  have been stable.  Marco [name] has been out since 05/13/2017 due to transportation issues      Expected Outcomes  Marco [name] will continue to take his med as presribed and partcipate in the program providing Marco [name] is able to partipate in the program.  Marco [name] will continue to take his med as presribed and partcipate in the program providing Marco [name] is able to partipate in the program.         Core Components/Risk Factors/Patient Goals at Discharge (Final Review):  Goals and Risk Factor Review - 06/11/17 1457      Core Components/Risk Factors/Patient Goals Review   Personal Goals Review  Weight Management/Obesity;Improve shortness of breath with ADL's;Heart Failure;Lipids;Diabetes    Review  Marco [name] has been out since 05/13/2017 due to transportation issues    Expected Outcomes  Marco [name] will continue to take his med as presribed and partcipate in the program providing Marco [name] is able to partipate in the program.       ITP Comments: ITP Comments    Row Name 07/09/17 1826           ITP Comments  30 Day ITP Review, Marco [name] has not been able to attend due to lack of transportation. Marco [name]'s last day of exercise was on 05/13/17          Comments: See ITP comment.Barnet Pall, RN,BSN 07/10/2017 8:59 AM

## 2017-07-10 ENCOUNTER — Encounter (HOSPITAL_COMMUNITY): Payer: Medicaid Other

## 2017-07-13 ENCOUNTER — Telehealth: Payer: Self-pay | Admitting: Cardiology

## 2017-07-13 ENCOUNTER — Encounter (HOSPITAL_COMMUNITY)
Admission: RE | Admit: 2017-07-13 | Discharge: 2017-07-13 | Disposition: A | Discharge status: Home / Self Care | Payer: Medicaid Other | Source: Ambulatory Visit | Attending: Cardiology | Admitting: Cardiology

## 2017-07-13 VITALS — BP 104/70 | HR 89 | Ht 65.5 in | Wt 218.5 lb

## 2017-07-13 DIAGNOSIS — N183 Chronic kidney disease, stage 3 (moderate): Secondary | ICD-10-CM | POA: Diagnosis not present

## 2017-07-13 DIAGNOSIS — Z87891 Personal history of nicotine dependence: Secondary | ICD-10-CM | POA: Insufficient documentation

## 2017-07-13 DIAGNOSIS — I5042 Chronic combined systolic (congestive) and diastolic (congestive) heart failure: Secondary | ICD-10-CM | POA: Diagnosis not present

## 2017-07-13 DIAGNOSIS — Z79899 Other long term (current) drug therapy: Secondary | ICD-10-CM | POA: Diagnosis not present

## 2017-07-13 DIAGNOSIS — Z794 Long term (current) use of insulin: Secondary | ICD-10-CM | POA: Diagnosis not present

## 2017-07-13 DIAGNOSIS — E1122 Type 2 diabetes mellitus with diabetic chronic kidney disease: Secondary | ICD-10-CM | POA: Diagnosis not present

## 2017-07-13 DIAGNOSIS — Z6838 Body mass index (BMI) 38.0-38.9, adult: Secondary | ICD-10-CM | POA: Insufficient documentation

## 2017-07-13 DIAGNOSIS — I130 Hypertensive heart and chronic kidney disease with heart failure and stage 1 through stage 4 chronic kidney disease, or unspecified chronic kidney disease: Secondary | ICD-10-CM | POA: Diagnosis present

## 2017-07-13 DIAGNOSIS — I110 Hypertensive heart disease with heart failure: Secondary | ICD-10-CM | POA: Diagnosis present

## 2017-07-13 LAB — GLUCOSE, CAPILLARY: GLUCOSE-CAPILLARY: 185 mg/dL — AB (ref 65–99)

## 2017-07-13 NOTE — Progress Notes (Signed)
Discharge Progress Report  Patient Details  Name: Marco [name] MRN: [id number] Date of Birth: 10-31-1955 Referring Provider:     [place] from 04/23/2017 in [place]  Referring Provider  [name] MD       Number of Visits: 7  Reason for Discharge:  [name] has met the maximum amount of time he can participate in the program per medicaid guidelines.  Smoking History:  Social History   Tobacco Use  Smoking Status Former Smoker  . Packs/day: 0.50  . Years: 10.00  . Pack years: 5.00  . Types: Cigarettes  . Last attempt to quit: 04/11/2008  . Years since quitting: 9.3  Smokeless Tobacco Never Used    Diagnosis:  Heart failure, systolic and diastolic, chronic (HCC)  ADL [facility]:   Initial Exercise Prescription: Initial Exercise Prescription - 04/23/17 1500      Date of Initial Exercise RX and Referring Provider   Date  04/23/17    Referring Provider  [name] MD      Bike   Level  0.5    Minutes  10    METs  1.94      NuStep   Level  2    SPM  75    Minutes  10    METs  1.8      Track   Laps  7    Minutes  10    METs  2.23      Prescription Details   Frequency (times per week)  3    Duration  Progress to 30 minutes of continuous aerobic without signs/symptoms of physical distress      Intensity   THRR 40-80% of Max Heartrate  63-126    Ratings of Perceived Exertion  11-13    Perceived Dyspnea  0-4      Progression   Progression  Continue to progress workloads to maintain intensity without signs/symptoms of physical distress.      Resistance Training   Training Prescription  Yes    Weight  2lbs    Reps  10-15       Discharge Exercise Prescription (Final Exercise Prescription Changes): Exercise Prescription Changes - 07/13/17 1446      Response to Exercise   Blood Pressure (Admit)  104/70    Blood Pressure (Exercise)  122/80    Blood Pressure (Exit)  100/76    Heart Rate  (Admit)  89 bpm    Heart Rate (Exercise)  114 bpm    Heart Rate (Exit)  88 bpm    Rating of Perceived Exertion (Exercise)  13    Symptoms  none    Duration  Progress to 30 minutes of  aerobic without signs/symptoms of physical distress    Intensity  THRR unchanged      Progression   Progression  Continue to progress workloads to maintain intensity without signs/symptoms of physical distress.    Average METs  2.5      Resistance Training   Training Prescription  Yes    Weight  2lbs    Reps  10-15    Time  10 Minutes      Interval Training   Interval Training  No      Recumbant Bike   Level  2    Watts  21    Minutes  10    METs  2.61      NuStep   Level  2    SPM  75    Minutes  10    METs  2.2      Track   Laps  9    Minutes  10    METs  2.57      Home Exercise Plan   Plans to continue exercise at  Home (comment)    Frequency  Add 2 additional days to program exercise sessions.    Initial Home Exercises Provided  07/13/17       Functional Capacity: 6 Minute Walk    Row Name 04/23/17 1530         6 Minute Walk   Phase  Initial     Distance  1107 feet     Walk Time  6 minutes     # of Rest Breaks  0     MPH  2.09     METS  2.4     RPE  13     VO2 Peak  8.54     Symptoms  Yes (comment)     Comments  fatigue!     Resting HR  87 bpm     Resting BP  104/60     Resting Oxygen Saturation   96 %     Exercise Oxygen Saturation  during 6 min walk  95 %     Max Ex. HR  101 bpm     Max Ex. BP  128/72     2 Minute Post BP  116/64        Psychological, QOL, Others - Outcomes: PHQ 2/9: Depression screen [facility] 2/9 07/13/2017 05/12/2017 04/27/2017 04/17/2017 03/23/2017  Decreased Interest 0 1 0 0 1  Down, Depressed, Hopeless 0 _0 PHQ - 2 Score 0 _1 Altered sleeping - 0 - 1 1  Tired, decreased energy - 1 - 0 1  Change in appetite - 0 - 0 0  Feeling bad or failure about yourself  - 0 - 0 0  Trouble concentrating - 0 - 0 0  Moving slowly or  fidgety/restless - 0 - 0 0  Suicidal thoughts - 0 - 0 0  PHQ-9 Score - 3 - 2 4  Some recent data might be hidden    Quality of Life: Quality of Life - 04/23/17 1547      Quality of Life Scores   Health/Function Pre  12.77 %    Socioeconomic Pre  22.14 %    Psych/Spiritual Pre  21.43 %    Family Pre  30 %    GLOBAL Pre  19.01 %       Personal Goals: Goals established at orientation with interventions provided to work toward goal. Personal Goals and Risk Factors at Admission - 04/23/17 1627      Core Components/Risk Factors/Patient Goals on Admission    Weight Management  Yes;Weight Maintenance;Obesity    Intervention  Weight Management: Develop a combined nutrition and exercise program designed to reach desired caloric intake, while maintaining appropriate intake of nutrient and fiber, sodium and fats, and appropriate energy expenditure required for the weight goal.;Weight Management: Provide education and appropriate resources to help participant work on and attain dietary goals.;Weight Management/Obesity: Establish reasonable short term and long term weight goals.;Obesity: Provide education and appropriate resources to help participant work on and attain dietary goals.    Expected Outcomes  Short Term: Continue to assess and modify interventions until short term weight is achieved;Long Term: Adherence to nutrition and physical activity/exercise program aimed  toward attainment of established weight goal;Weight Loss: Understanding of general recommendations for a balanced deficit meal plan, which promotes 1-2 lb weight loss per week and includes a negative energy balance of [phone] kcal/d;Weight Maintenance: Understanding of the daily nutrition guidelines, which includes 25-35% calories from fat, 7% or less cal from saturated fats, less than 274m cholesterol, less than 1.5gm of sodium, & 5 or more servings of fruits and vegetables daily;Understanding recommendations for meals to include  15-35% energy as protein, 25-35% energy from fat, 35-60% energy from carbohydrates, less than 2084mof dietary cholesterol, 20-35 gm of total fiber daily;Understanding of distribution of calorie intake throughout the day with the consumption of 4-5 meals/snacks    Improve shortness of breath with ADL's  Yes    Intervention  Provide education, individualized exercise plan and daily activity instruction to help decrease symptoms of SOB with activities of daily living.    Expected Outcomes  Short Term: Achieves a reduction of symptoms when performing activities of daily living.    Diabetes  Yes    Intervention  Provide education about signs/symptoms and action to take for hypo/hyperglycemia.;Provide education about proper nutrition, including hydration, and aerobic/resistive exercise prescription along with prescribed medications to achieve blood glucose in normal ranges: Fasting glucose 65-99 mg/dL    Expected Outcomes  Short Term: Participant verbalizes understanding of the signs/symptoms and immediate care of hyper/hypoglycemia, proper foot care and importance of medication, aerobic/resistive exercise and nutrition plan for blood glucose control.;Long Term: Attainment of HbA1C < 7%.    Heart Failure  Yes    Intervention  Provide a combined exercise and nutrition program that is supplemented with education, support and counseling about heart failure. Directed toward relieving symptoms such as shortness of breath, decreased exercise tolerance, and extremity edema.    Expected Outcomes  Improve functional capacity of life;Short term: Daily weights obtained and reported for increase. Utilizing diuretic protocols set by physician.;Short term: Attendance in program 2-3 days a week with increased exercise capacity. Reported lower sodium intake. Reported increased fruit and vegetable intake. Reports medication compliance.;Long term: Adoption of self-care skills and reduction of barriers for early signs and symptoms  recognition and intervention leading to self-care maintenance.    Hypertension  Yes    Intervention  Provide education on lifestyle modifcations including regular physical activity/exercise, weight management, moderate sodium restriction and increased consumption of fresh fruit, vegetables, and low fat dairy, alcohol moderation, and smoking cessation.;Monitor prescription use compliance.    Expected Outcomes  Long Term: Maintenance of blood pressure at goal levels.;Short Term: Continued assessment and intervention until BP is < 140/9029mG in hypertensive participants. < 130/52m79m in hypertensive participants with diabetes, heart failure or chronic kidney disease.    Lipids  Yes    Intervention  Provide education and support for participant on nutrition & aerobic/resistive exercise along with prescribed medications to achieve LDL <70mg22mL >40mg.54mExpected Outcomes  Short Term: Participant states understanding of desired cholesterol values and is compliant with medications prescribed. Participant is following exercise prescription and nutrition guidelines.;Long Term: Cholesterol controlled with medications as prescribed, with individualized exercise RX and with personalized nutrition plan. Value goals: LDL < 70mg, 48m> 40 mg.    Stress  Yes    Intervention  Offer individual and/or small group education and counseling on adjustment to heart disease, stress management and health-related lifestyle change. Teach and support self-help strategies.;Refer participants experiencing significant psychosocial distress to appropriate mental health specialists for further evaluation and treatment. When  possible, include family members and significant others in education/counseling sessions.    Expected Outcomes  Short Term: Participant demonstrates changes in health-related behavior, relaxation and other stress management skills, ability to obtain effective social support, and compliance with psychotropic  medications if prescribed.;Long Term: Emotional wellbeing is indicated by absence of clinically significant psychosocial distress or social isolation.        Personal Goals Discharge: Goals and Risk Factor Review    Row Name 05/19/17 1510 06/11/17 1457           Core Components/Risk Factors/Patient Goals Review   Personal Goals Review  Weight Management/Obesity;Improve shortness of breath with ADL's;Heart Failure;Lipids;Diabetes  Weight Management/Obesity;Improve shortness of breath with ADL's;Heart Failure;Lipids;Diabetes      Review  [name] has lost i kg since he started participating in the program. [name]'s vital signs and CBG's  have been stable.  [name] has been out since 05/13/2017 due to transportation issues      Expected Outcomes  [name] will continue to take his med as presribed and partcipate in the program providing [name] is able to partipate in the program.  [name] will continue to take his med as presribed and partcipate in the program providing [name] is able to partipate in the program.         Exercise Goals and Review: Exercise Goals    Row Name 04/23/17 1546             Exercise Goals   Increase Physical Activity  Yes       Intervention  Provide advice, education, support and counseling about physical activity/exercise needs.;Develop an individualized exercise prescription for aerobic and resistive training based on initial evaluation findings, risk stratification, comorbidities and participant's personal goals.       Expected Outcomes  Achievement of increased cardiorespiratory fitness and enhanced flexibility, muscular endurance and strength shown through measurements of functional capacity and personal statement of participant.       Increase Strength and Stamina  Yes       Intervention  Provide advice, education, support and counseling about physical activity/exercise needs.;Develop an individualized exercise prescription for aerobic and resistive training  based on initial evaluation findings, risk stratification, comorbidities and participant's personal goals.       Expected Outcomes  Achievement of increased cardiorespiratory fitness and enhanced flexibility, muscular endurance and strength shown through measurements of functional capacity and personal statement of participant.       Able to understand and use rate of perceived exertion (RPE) scale  Yes       Intervention  Provide education and explanation on how to use RPE scale       Expected Outcomes  Short Term: Able to use RPE daily in rehab to express subjective intensity level;Long Term:  Able to use RPE to guide intensity level when exercising independently       Knowledge and understanding of Target Heart Rate Range (THRR)  Yes       Intervention  Provide education and explanation of THRR including how the numbers were predicted and where they are located for reference       Expected Outcomes  Short Term: Able to state/look up THRR;Long Term: Able to use THRR to govern intensity when exercising independently;Short Term: Able to use daily as guideline for intensity in rehab       Able to check pulse independently  Yes       Intervention  Provide education and demonstration on how to check pulse in carotid and  radial arteries.;Review the importance of being able to check your own pulse for safety during independent exercise       Expected Outcomes  Short Term: Able to explain why pulse checking is important during independent exercise;Long Term: Able to check pulse independently and accurately       Understanding of Exercise Prescription  Yes       Intervention  Provide education, explanation, and written materials on patient's individual exercise prescription       Expected Outcomes  Short Term: Able to explain program exercise prescription;Long Term: Able to explain home exercise prescription to exercise independently          Nutrition & Weight - Outcomes: Pre Biometrics - 04/23/17 1622       Pre Biometrics   Height  5' 5.5" (1.664 m)    Weight  225 lb 5 oz (102.2 kg)    Waist Circumference  49 inches    Hip Circumference  43 inches    Waist to Hip Ratio  1.14 %    BMI (Calculated)  36.91    Triceps Skinfold  43 mm    % Body Fat  39.9 %    Grip Strength  45 kg    Single Leg Stand  5 seconds      Post Biometrics - 07/13/17 1522       Post  Biometrics   Height  5' 5.5" (1.664 m)    Weight  218 lb 7.6 oz (99.1 kg)    BMI (Calculated)  35.79       Nutrition: Nutrition Therapy & Goals - 04/23/17 1451      Nutrition Therapy   Diet  Carb Modified, Therapeutic Lifestyle Changes      Personal Nutrition Goals   Nutrition Goal  Pt to identify food quantities necessary to achieve weight loss of 6-24 lb (2.7-10.9 kg) at graduation from cardiac rehab. Goal wt of 180-190 lb desired.     Personal Goal #2  CBG concentrations in the normal range or as close to normal as is safely possible.      Intervention Plan   Intervention  Prescribe, educate and counsel regarding individualized specific dietary modifications aiming towards targeted core components such as weight, hypertension, lipid management, diabetes, heart failure and other comorbidities.;Nutrition handout(s) given to patient. Conservator, museum/gallery given    Expected Outcomes  Short Term Goal: Understand basic principles of dietary content, such as calories, fat, sodium, cholesterol and nutrients.;Long Term Goal: Adherence to prescribed nutrition plan.       Nutrition Discharge: Nutrition Assessments - 04/23/17 1451      MEDFICTS Scores   Pre Score  48       Education Questionnaire Score: Knowledge Questionnaire Score - 04/23/17 1545      Knowledge Questionnaire Score   Pre Score  22/24       Goals reviewed with patient; copy given to patient. [name] returned to exercise today to complete his last day of exercise. [name] enjoyed participating in phase 2 cardiac rehab, but was not able to attend  on a  regular basis due to transportation issues and the death of [name]'s mother in law.  [name] was able to get [employer] transportation set up for his transportation needs since he does no have access to an automobile right now. [name] plans to continue  Exercise via walking and may join [employer].Barnet Pall, RN,BSN 07/28/2017 10:09 AM

## 2017-07-13 NOTE — Telephone Encounter (Signed)
Patient states he was at cardiac rehab and he was informed that he only has 2 sessions left and is requesting if he could get additional classes since he has reliable transportation. Informed patient I would reach out to Verdis Frederickson, [place] for clarification on guidelines. Patient verbalized understanding and thanked me for the call.

## 2017-07-13 NOTE — Telephone Encounter (Signed)
Patient calling, states that he missed about two months of his cardiac rehab due to lack of transportation. Patient was recently approved for [employer] transportation and would like to know if he can have another session.

## 2017-07-15 ENCOUNTER — Encounter (HOSPITAL_COMMUNITY): Payer: Medicaid Other

## 2017-07-17 ENCOUNTER — Encounter: Payer: Self-pay | Admitting: Internal Medicine

## 2017-07-17 ENCOUNTER — Ambulatory Visit: Payer: Medicaid Other | Attending: Internal Medicine | Admitting: Internal Medicine

## 2017-07-17 VITALS — BP 117/73 | HR 73 | Temp 98.2°F | Resp 18 | Ht 66.0 in | Wt 223.0 lb

## 2017-07-17 DIAGNOSIS — G8929 Other chronic pain: Secondary | ICD-10-CM | POA: Diagnosis not present

## 2017-07-17 DIAGNOSIS — E669 Obesity, unspecified: Secondary | ICD-10-CM | POA: Insufficient documentation

## 2017-07-17 DIAGNOSIS — Z8249 Family history of ischemic heart disease and other diseases of the circulatory system: Secondary | ICD-10-CM | POA: Diagnosis not present

## 2017-07-17 DIAGNOSIS — Z7982 Long term (current) use of aspirin: Secondary | ICD-10-CM | POA: Insufficient documentation

## 2017-07-17 DIAGNOSIS — I10 Essential (primary) hypertension: Secondary | ICD-10-CM

## 2017-07-17 DIAGNOSIS — M109 Gout, unspecified: Secondary | ICD-10-CM | POA: Diagnosis not present

## 2017-07-17 DIAGNOSIS — E785 Hyperlipidemia, unspecified: Secondary | ICD-10-CM | POA: Insufficient documentation

## 2017-07-17 DIAGNOSIS — M1990 Unspecified osteoarthritis, unspecified site: Secondary | ICD-10-CM | POA: Diagnosis not present

## 2017-07-17 DIAGNOSIS — G4700 Insomnia, unspecified: Secondary | ICD-10-CM | POA: Diagnosis not present

## 2017-07-17 DIAGNOSIS — Z79899 Other long term (current) drug therapy: Secondary | ICD-10-CM | POA: Diagnosis not present

## 2017-07-17 DIAGNOSIS — Z794 Long term (current) use of insulin: Secondary | ICD-10-CM | POA: Insufficient documentation

## 2017-07-17 DIAGNOSIS — Z833 Family history of diabetes mellitus: Secondary | ICD-10-CM | POA: Diagnosis not present

## 2017-07-17 DIAGNOSIS — Z6836 Body mass index (BMI) 36.0-36.9, adult: Secondary | ICD-10-CM | POA: Diagnosis not present

## 2017-07-17 DIAGNOSIS — I130 Hypertensive heart and chronic kidney disease with heart failure and stage 1 through stage 4 chronic kidney disease, or unspecified chronic kidney disease: Secondary | ICD-10-CM | POA: Insufficient documentation

## 2017-07-17 DIAGNOSIS — Z87891 Personal history of nicotine dependence: Secondary | ICD-10-CM | POA: Insufficient documentation

## 2017-07-17 DIAGNOSIS — E11319 Type 2 diabetes mellitus with unspecified diabetic retinopathy without macular edema: Secondary | ICD-10-CM | POA: Diagnosis not present

## 2017-07-17 DIAGNOSIS — E1142 Type 2 diabetes mellitus with diabetic polyneuropathy: Secondary | ICD-10-CM | POA: Insufficient documentation

## 2017-07-17 DIAGNOSIS — E118 Type 2 diabetes mellitus with unspecified complications: Secondary | ICD-10-CM

## 2017-07-17 DIAGNOSIS — L409 Psoriasis, unspecified: Secondary | ICD-10-CM | POA: Insufficient documentation

## 2017-07-17 DIAGNOSIS — I5042 Chronic combined systolic (congestive) and diastolic (congestive) heart failure: Secondary | ICD-10-CM | POA: Diagnosis not present

## 2017-07-17 DIAGNOSIS — N183 Chronic kidney disease, stage 3 (moderate): Secondary | ICD-10-CM | POA: Insufficient documentation

## 2017-07-17 DIAGNOSIS — G4733 Obstructive sleep apnea (adult) (pediatric): Secondary | ICD-10-CM | POA: Diagnosis not present

## 2017-07-17 DIAGNOSIS — R1319 Other dysphagia: Secondary | ICD-10-CM

## 2017-07-17 DIAGNOSIS — Z9989 Dependence on other enabling machines and devices: Secondary | ICD-10-CM

## 2017-07-17 DIAGNOSIS — Z8711 Personal history of peptic ulcer disease: Secondary | ICD-10-CM | POA: Diagnosis not present

## 2017-07-17 DIAGNOSIS — Z825 Family history of asthma and other chronic lower respiratory diseases: Secondary | ICD-10-CM | POA: Insufficient documentation

## 2017-07-17 DIAGNOSIS — E1122 Type 2 diabetes mellitus with diabetic chronic kidney disease: Secondary | ICD-10-CM | POA: Insufficient documentation

## 2017-07-17 DIAGNOSIS — R1310 Dysphagia, unspecified: Secondary | ICD-10-CM | POA: Insufficient documentation

## 2017-07-17 DIAGNOSIS — I2510 Atherosclerotic heart disease of native coronary artery without angina pectoris: Secondary | ICD-10-CM | POA: Diagnosis not present

## 2017-07-17 DIAGNOSIS — Z801 Family history of malignant neoplasm of trachea, bronchus and lung: Secondary | ICD-10-CM | POA: Insufficient documentation

## 2017-07-17 LAB — GLUCOSE, POCT (MANUAL RESULT ENTRY): POC Glucose: 187 mg/dl — AB (ref 70–99)

## 2017-07-17 LAB — POCT GLYCOSYLATED HEMOGLOBIN (HGB A1C): HEMOGLOBIN A1C: 7

## 2017-07-17 MED ORDER — ZOLPIDEM TARTRATE ER 6.25 MG PO TBCR: 6.2500 mg | EXTENDED_RELEASE_TABLET | Freq: Every evening | ORAL | 1 refills | Status: DC | PRN

## 2017-07-17 NOTE — Patient Instructions (Signed)
Stop Eszopiclone. Start Ambien instead.

## 2017-07-17 NOTE — Progress Notes (Signed)
Patient ID: Marco [name], male    DOB: November 23, 1955  MRN: [id number]  CC: Follow-up   Subjective: Marco [name] is a 61 y.o. male who presents for chronic ds management. His concerns today include:  61 year old male with history of HTN, DM 2 with neuropathy, CKD stage III, obesity, nonobstructive CAD, chronic combined CHF, OSA on CPAP, followed by rheumatology in [facility] for psoriasis/gout/nonspec arthritis. On narcotics through [facility] for chronic pain in chest/back/shoulders/neck  1. Dysphagia: did not get appt as yet for second opinion with another GI specialist.   2.  DM: checking 3 x a day. Range 90-180 -doing good with eating habits Compliant with Novolog sliding scale (from 14-20 units on average), Levemir 50 units and Metformin,  -completed cardiac rehab. Missed all of November due to lack of transportation  3. HTN/CAD/ -compliant with meds -gets SOB easily. Pain in chest when he eats. Await second GI opinion -no LE edema  4. OSA: tries to use the CPAP as often as he can tolerate. Some nights he only can sleep with it 2-3 hrs.   5. Chronic pain and psoriasis/arthritis: Lyrica will be d/c after this week. Changed to Gabapentin 300 mg TID by rheumatologist. Still sees a pain specialist and on Hydrocodone  5. Insomnia: on Eszopiclone but feels it no longer works. Would like to do something different. Takes 40 mins to 1 hr to fall asleep. Toss and turns. Sleeps for about 4 hrs then back up again until 9 a.m. Falls asleep around 9 a.m until 11:30 a.m -he turns off all lights and sounds.  -drinks coffee in a.m. No sodas or ETOH  at nights.   Patient Active Problem List   Diagnosis Date Noted  . Dysphagia 01/09/2017  . Hyperlipidemia 12/23/2016  . Chronic combined systolic and diastolic CHF (congestive heart failure) ([place]) 09/09/2016  . Diabetes mellitus with complication ([place])   . Diabetic retinopathy ([place]) 07/22/2016  . Diabetic polyneuropathy associated with type 2  diabetes mellitus ([place]) 06/10/2016  . Myalgia and myositis 03/31/2016  . Dyspnea on exertion 02/09/2016  . Abdominal bloating 02/05/2016  . Chronic gouty arthritis 11/29/2015  . Ache in joint 11/27/2015  . LBP (low back pain) 11/27/2015  . Arthralgia of multiple joints 11/27/2015  . Psoriasis 11/27/2015  . Breast pain in male 11/14/2015  . Sinusitis, chronic 10/16/2015  . Insomnia 10/16/2015  . Adult BMI 30+ 10/04/2015  . New onset of headaches after age 25 09/20/2015  . OSA (obstructive sleep apnea) 07/12/2015  . Low serum testosterone level 05/18/2015  . Depression 05/16/2015  . Chronic pain   . CAD (coronary artery disease) 12/24/2014  . Persistent atrial fibrillation ([place]) 12/24/2014  . Essential hypertension 12/24/2014  . CKD (chronic kidney disease) stage 3, GFR 30-59 ml/min (HCC) 12/24/2014  . PUD (peptic ulcer disease) 12/24/2014  . DM2 (diabetes mellitus, type 2) ([place]) 12/24/2014     Current Outpatient Medications on File Prior to Visit  Medication Sig Dispense Refill  . ACCU-CHEK AVIVA PLUS test strip USE ONE STRIP TO CHECK BLOOD GLUCOSE 3 TIMES DAILY 100 each 12  . ACCU-CHEK FASTCLIX LANCETS MISC Use a directed to check blood sugar 3 times daily. 100 each 11  . albuterol (PROVENTIL HFA;VENTOLIN HFA) 108 (90 Base) MCG/ACT inhaler Inhale 2 puffs into the lungs every 6 (six) hours as needed for wheezing or shortness of breath. 1 Inhaler 2  . aspirin (EQ ASPIRIN ADULT LOW DOSE) 81 MG EC tablet Take 1 tablet (81 mg total)  by mouth daily. Swallow whole. 90 tablet 3  . atorvastatin (LIPITOR) 40 MG tablet Take 1 tablet (40 mg total) by mouth daily. 90 tablet 3  . BD PEN NEEDLE NANO U/F 32G X 4 MM MISC USE ONE PEN NEEDLE THREE TIMES DAILY 100 each 11  . Blood Glucose Monitoring Suppl (ACCU-CHEK AVIVA PLUS) w/Device KIT 1 Device by Does not apply route 3 (three) times daily after meals. E11.9 1 kit 0  . Blood Pressure KIT 1 each by Does not apply route 2 (two) times daily. 1 each  0  . fluorometholone (FML) 0.1 % ophthalmic suspension Place 1 drop into both eyes 3 (three) times daily.    . furosemide (LASIX) 40 MG tablet Take 1 tablet by mouth every morning (Patient taking differently: Take 40 mg by mouth every morning. Take 1 tablet by mouth every morning) 90 tablet 3  . glucose blood (ACCU-CHEK AVIVA PLUS) test strip 1 each by Other route 3 (three) times daily. E11.9 100 each 12  . HYDROcodone-acetaminophen (NORCO/VICODIN) 5-325 MG tablet Take 1 tablet by mouth every 8 (eight) hours as needed for moderate pain. Per pain management    . insulin aspart (NOVOLOG FLEXPEN) 100 UNIT/ML FlexPen Inject 14-24 Units into the skin 3 (three) times daily with meals. 15 mL 5  . Insulin Detemir (LEVEMIR FLEXTOUCH) 100 UNIT/ML Pen Inject 50 Units into the skin daily at 10 pm. 45 mL 3  . leflunomide (ARAVA) 10 MG tablet Take 10 mg by mouth daily.    [name] losartan (COZAAR) 50 MG tablet Take 50 mg by mouth daily.    . metFORMIN (GLUCOPHAGE-XR) 500 MG 24 hr tablet Take 2 tablets (1,000 mg total) by mouth 2 (two) times daily. 360 tablet 3  . predniSONE (DELTASONE) 10 MG tablet Take 1 tablet (10 mg total) by mouth daily with breakfast. 90 tablet 3  . pregabalin (LYRICA) 100 MG capsule Take 1 capsule (100 mg total) by mouth 3 (three) times daily. 90 capsule 5  . ranitidine (ZANTAC) 150 MG tablet Take 1 tablet (150 mg total) by mouth at bedtime. (Patient taking differently: Take 150 mg by mouth daily before breakfast. ) 90 tablet 3  . Secukinumab (COSENTYX) 150 MG/ML SOSY Inject 150 mg into the skin every 28 (twenty-eight) days.      No current facility-administered medications on file prior to visit.     No Known Allergies  Social History   Socioeconomic History  . Marital status: Married    Spouse name: Amethyst  . Number of children: 8  . Years of education: 60  . Highest education level: Not on file  Social Needs  . Financial resource strain: Not on file  . Food insecurity - worry:  Not on file  . Food insecurity - inability: Not on file  . Transportation needs - medical: Not on file  . Transportation needs - non-medical: Not on file  Occupational History    Comment: disabled  Tobacco Use  . Smoking status: Former Smoker    Packs/day: 0.50    Years: 10.00    Pack years: 5.00    Types: Cigarettes    Last attempt to quit: 04/11/2008    Years since quitting: 9.2  . Smokeless tobacco: Never Used  Substance and Sexual Activity  . Alcohol use: No    Alcohol/week: 0.0 oz  . Drug use: No  . Sexual activity: Yes    Partners: Female    Birth control/protection: None  Other Topics Concern  .  Not on file  Social History Narrative   Lives with wife. Does not work.  On disability.    Caffeine- coffee, 1/2 cup daily    Family History  Problem Relation Age of Onset  . Hypertension Mother   . Diabetes Mother   . COPD Mother   . Lung cancer Mother        Smoker   . Hypertension Father   . Diabetes Father   . Heart Problems Father   . COPD Father   . Colon cancer Neg Hx   . Stomach cancer Neg Hx     Past Surgical History:  Procedure Laterality Date  . CARDIAC CATHETERIZATION  05/2014   ablation for atrial fibrillation  . CARDIAC CATHETERIZATION N/A 11/16/2015   Procedure: Left Heart Cath and Coronary Angiography;  Surgeon: Leonie Man, MD;  Location: [facility];  Service: Cardiovascular;  Laterality: N/A;  . COLON RESECTION  09/2013   due to large, abnormal polpy. non cancerous per patient.   . COLON SURGERY  09/2014   colon resection     ROS: Review of Systems Neg except as above PHYSICAL EXAM: BP 117/73 (BP Location: Left Arm, Patient Position: Sitting, Cuff Size: Large)   Pulse 73   Temp 98.2 F (36.8 C) (Oral)   Resp 18   Ht _0  (1.676 m)   Wt 223 lb (101.2 kg)   SpO2 98%   BMI 35.99 kg/m   Physical Exam General appearance - alert, well appearing, and in no distress Mental status - alert, oriented to person, place, and time,  normal mood, behavior, speech, dress, motor activity, and thought processes Neck - supple, no significant adenopathy Chest - clear to auscultation, no wheezes, rales or rhonchi, symmetric air entry Heart - normal rate, regular rhythm, normal S1, S2, no murmurs, rubs, clicks or gallops Extremities -no LE edema  Results for orders placed or performed in visit on 07/17/17  HgB A1c  Result Value Ref Range   Hemoglobin A1C 7.0   Glucose (CBG)  Result Value Ref Range   POC Glucose 187 (A) 70 - 99 mg/dl    ASSESSMENT AND PLAN: 1. Diabetes mellitus with complication (HCC) At goal. Continue LAntus, Novolog and Metformin. Last GFR 53 in 04/2017 - HgB A1c - Glucose (CBG)  2. Esophageal dysphagia I sent a message to our referral person today inquiring about the referral placed to GI  3. Essential hypertension At goal.  Continue Cozaar  4. Coronary artery disease involving native coronary artery of native heart without angina pectoris Stable.  Continue aspirin, statin,  5. OSA on CPAP Encouraged continued compliance with CPAP  6. Insomnia, unspecified type Good sleep hygiene discussed. Stop Eszopiclone.  Trial of controlled release Ambien.  Discussed risks of overdose with narcotics, gabapentin and sleep aid. - zolpidem (AMBIEN CR) 6.25 MG CR tablet; Take 1 tablet (6.25 mg total) by mouth at bedtime as needed for sleep.  Dispense: 30 tablet; Refill: 1  Patient was given the opportunity to ask questions.  Patient verbalized understanding of the plan and was able to repeat key elements of the plan.   Orders Placed This Encounter  Procedures  . HgB A1c  . Glucose (CBG)     Requested Prescriptions   Signed Prescriptions Disp Refills  . zolpidem (AMBIEN CR) 6.25 MG CR tablet 30 tablet 1    Sig: Take 1 tablet (6.25 mg total) by mouth at bedtime as needed for sleep.    Return in about  3 months (around 10/15/2017).  Karle Plumber, MD, FACP

## 2017-07-21 ENCOUNTER — Encounter: Payer: Self-pay | Admitting: Internal Medicine

## 2017-07-21 NOTE — Progress Notes (Signed)
Reviewed home exercise guidelines with patient including endpoints, temperature precautions, target heart rate and rate of perceived exertion. Pt plans to walk as her mode of home exercise. Pt voices understanding of instructions given. Makael Stein M Eliasar Hlavaty, MS, ACSM CEP  

## 2017-07-22 ENCOUNTER — Telehealth: Payer: Self-pay | Admitting: Internal Medicine

## 2017-07-22 NOTE — Telephone Encounter (Signed)
Patient called asking for lyrica increass. Please fu patient says that he cannot take the gabapentin it makes him sick.

## 2017-07-23 ENCOUNTER — Ambulatory Visit: Payer: Medicaid Other | Admitting: Internal Medicine

## 2017-07-23 ENCOUNTER — Encounter: Payer: Self-pay | Admitting: Internal Medicine

## 2017-07-23 ENCOUNTER — Encounter: Payer: Self-pay | Admitting: Pharmacist

## 2017-07-23 VITALS — BP 120/76 | HR 87 | Ht 66.0 in | Wt 217.4 lb

## 2017-07-23 DIAGNOSIS — R0609 Other forms of dyspnea: Secondary | ICD-10-CM | POA: Diagnosis not present

## 2017-07-23 DIAGNOSIS — G4733 Obstructive sleep apnea (adult) (pediatric): Secondary | ICD-10-CM

## 2017-07-23 NOTE — Telephone Encounter (Signed)
PC placed to pt. Pt states he did not request anything for nausea. He was wondering about going up on the Lyrica since he could not tolerate the Gabapentin. Pt is on Lyrica 100 mg TID and on Vicodin  (from pain specialist). I advise staying on the 300 mg/day total of the Lyrica.  He also asked about the Metformin pill showing up in his stools. He is on the XR pill. Pt informed that this is the shell that shows in the stools but the med is absorbed.

## 2017-07-23 NOTE — Progress Notes (Signed)
HPI male former smoker followed for OSA/ Insomnia, dyspnea on exertion, complicated by chronic pain, anxiety, DM 2, history cardiac ablation, gout NPSG 37.8/hour on 06/14/2015, desaturation to 76%, body weight 223 pounds BiPAP titration 08/19/2015- to 20/16 Barium Swallow 02/07/2016-normal CXR 01/22/2016-NAD PFT 02/22/2016-minimal restriction, mild diffusion deficit. FVC 2.62/76%, FEV1 2.14/81%, ratio 0.82, DLCO 61%, TLC 79%, RV/TLC 135%. 6MWT- 02/22/16-97%, 98%, 98%. Stopped after only 30 m complaining of chest tightness, headache, shortness of breath, dizziness. BP and heart rate were unremarkable. Unattended Home Sleep Test-05/25/2016-AHI 36.9/hour, desaturation to 82%, body weight 212 pounds 05/15/2016-61 year old male former smoker followed for OSA, dyspnea on exertion, complicated by chronic pain, DM 2, history cardiac ablation BIPAP 20/26   ----------------------------------------------------------------------------  12/30/16- 61 year old male former smoker followed for OSA/ Insomnia, dyspnea on exertion, complicated by chronic pain, anxiety, DM 2, history cardiac ablation, gout BIPAP 16/ 16 [facility] patient is using BI-PAP intermittently and contiunes to have a dry mouth. DME is [facility] he says he can't tell that his BiPAP makes much difference either way. Download-17%/4 hour compliance, AHI 6.9/hour He still has his chronic complaint that using CPAP dries him out so he wakes choking and gagging. Also complains his chest gets tight and he can't breathe comfortably. This has been an intermittent, non-positional disturbance. Cardiology evaluation continues. Uses Lunesta 3 mg for sleep.  07/23/17- 61 year old male former smoker followed for OSA/ Insomnia, dyspnea on exertion, complicated by chronic pain, anxiety, DM 2, history cardiac ablation, gout, CHF, CKD BIPAP 16/ 12 [facility] ---OSA; DME [facility]. Pt wears BiPAP but has been having issues/concerns with pressure.  Not comfortable with his BiPAP  "hard to get air in" with occasional shortness of breath. Download 17% compliance, AHI 9.2/hour Complains of easy dyspnea on exertion without acute event or significant cough or wheeze.  We reviewed PFT from 2017 showing moderate reduced diffusion and suggestion of air trapping. CXR 03/05/17- IMPRESSION: No active cardiopulmonary disease   ROS-see HPI    "+" = pos Constitutional:    weight loss, night sweats, fevers, chills, fatigue, lassitude. HEENT:    headaches, + difficulty swallowing, tooth/dental problems, sore throat,       sneezing, itching, ear ache, nasal congestion, post nasal drip, snoring CV:     chest pain, orthopnea, PND, + swelling in lower extremities, anasarca,                                                          dizziness, palpitations Resp:  + shortness of breath with exertion or at rest.                productive cough,   non-productive cough, coughing up of blood.              change in color of mucus.  wheezing.   Skin:    rash or lesions. GI:  No-   heartburn, indigestion, + abdominal pain, nausea, vomiting, diarrhea,                 change in bowel habits, loss of appetite GU: dysuria, change in color of urine, no urgency or frequency.   flank pain. MS:   + joint pain, stiffness, decreased range of motion, back pain. Neuro-     nothing unusual Psych:  change in mood or affect.  depression or anxiety.  memory loss.  OBJ- Physical Exam General- Alert, Oriented, Affect-appropriate, Distress- none acute, + overweight  Skin- rash-none, lesions- none, excoriation- none Lymphadenopathy- none Head- atraumatic            Eyes- Gross vision intact, PERRLA, conjunctivae and secretions clear            Ears- Hearing, canals-normal            Nose- Clear, no-Septal dev, mucus, polyps, erosion, perforation             Throat- Mallampati IV , mucosa clear-not particularly dry , drainage- none, tonsils- atrophic,                 +many missing teeth Neck- flexible ,  trachea midline, no stridor , thyroid nl, carotid no bruit Chest - symmetrical excursion , unlabored           Heart/CV- RRR , no murmur , no gallop  , no rub, nl s1 s2                           - JVD- none , edema- none, stasis changes- none, varices- none           Lung- clear to P&A, wheeze- none, cough- none , dullness-none, rub- none           Chest wall-  Abd-  Br/ Gen/ Rectal- Not done, not indicated Extrem- cyanosis- none, clubbing, none, atrophy- none, strength- nl Neuro- grossly intact to observation

## 2017-07-23 NOTE — Progress Notes (Signed)
PA submitted and approved for zolpidem ER x 180 days. Approval #[id number]

## 2017-07-23 NOTE — Patient Instructions (Signed)
Order- DME [facility]- please reduce BIPAP pressures to 16/13, PS 3 , continue mask of choice humidifier, supplies, AirView   Dx OSA            Please have someone work with patient on comfort issues for better compliance.   Please call if we can help

## 2017-07-23 NOTE — Telephone Encounter (Signed)
Patient verified DOB Patient is aware of currently being on the highest dosage of lyrica.

## 2017-08-07 ENCOUNTER — Ambulatory Visit: Payer: Medicaid Other | Admitting: Internal Medicine

## 2017-08-18 NOTE — Assessment & Plan Note (Signed)
There does not seem to have been a significant change in his exercise tolerance.  With reduced diffusion capacity and normal oxygenation, this may be primarily reflect his cardiac disease.

## 2017-08-18 NOTE — Assessment & Plan Note (Addendum)
He never seems to be fully comfortable with CPAP or BiPAP. Most likely this is an issue with him, rather than device failure.  We will keep trying.  Compliance goals and medical purpose were emphasized again. Plan-change BiPAP to 16/13

## 2017-08-21 ENCOUNTER — Other Ambulatory Visit: Payer: Medicaid Other

## 2017-08-24 ENCOUNTER — Encounter (HOSPITAL_COMMUNITY): Payer: Self-pay

## 2017-08-24 ENCOUNTER — Emergency Department (HOSPITAL_COMMUNITY): Payer: Medicaid Other

## 2017-08-24 ENCOUNTER — Emergency Department (HOSPITAL_COMMUNITY)
Admission: EM | Admit: 2017-08-24 | Discharge: 2017-08-25 | Disposition: A | Discharge status: Home / Self Care | Payer: Medicaid Other | Attending: Emergency Medicine | Admitting: Emergency Medicine

## 2017-08-24 DIAGNOSIS — Z87891 Personal history of nicotine dependence: Secondary | ICD-10-CM | POA: Insufficient documentation

## 2017-08-24 DIAGNOSIS — Z7982 Long term (current) use of aspirin: Secondary | ICD-10-CM | POA: Insufficient documentation

## 2017-08-24 DIAGNOSIS — I130 Hypertensive heart and chronic kidney disease with heart failure and stage 1 through stage 4 chronic kidney disease, or unspecified chronic kidney disease: Secondary | ICD-10-CM | POA: Insufficient documentation

## 2017-08-24 DIAGNOSIS — Z79899 Other long term (current) drug therapy: Secondary | ICD-10-CM | POA: Insufficient documentation

## 2017-08-24 DIAGNOSIS — I5042 Chronic combined systolic (congestive) and diastolic (congestive) heart failure: Secondary | ICD-10-CM | POA: Diagnosis not present

## 2017-08-24 DIAGNOSIS — E1122 Type 2 diabetes mellitus with diabetic chronic kidney disease: Secondary | ICD-10-CM | POA: Diagnosis not present

## 2017-08-24 DIAGNOSIS — F329 Major depressive disorder, single episode, unspecified: Secondary | ICD-10-CM | POA: Insufficient documentation

## 2017-08-24 DIAGNOSIS — Z794 Long term (current) use of insulin: Secondary | ICD-10-CM | POA: Insufficient documentation

## 2017-08-24 DIAGNOSIS — N183 Chronic kidney disease, stage 3 (moderate): Secondary | ICD-10-CM | POA: Insufficient documentation

## 2017-08-24 DIAGNOSIS — M25561 Pain in right knee: Secondary | ICD-10-CM | POA: Diagnosis present

## 2017-08-24 LAB — COMPREHENSIVE METABOLIC PANEL
ALK PHOS: 38 U/L (ref 38–126)
ALT: 17 U/L (ref 17–63)
ANION GAP: 20 — AB (ref 5–15)
AST: 31 U/L (ref 15–41)
Albumin: 3.8 g/dL (ref 3.5–5.0)
BILIRUBIN TOTAL: 0.8 mg/dL (ref 0.3–1.2)
BUN: 13 mg/dL (ref 6–20)
CALCIUM: 8 mg/dL — AB (ref 8.9–10.3)
CO2: 25 mmol/L (ref 22–32)
Chloride: 93 mmol/L — ABNORMAL LOW (ref 101–111)
Creatinine, Ser: 1.86 mg/dL — ABNORMAL HIGH (ref 0.61–1.24)
GFR calc Af Amer: 43 mL/min — ABNORMAL LOW (ref >60)
GFR, EST NON AFRICAN AMERICAN: 37 mL/min — AB (ref >60)
Glucose, Bld: 234 mg/dL — ABNORMAL HIGH (ref 65–99)
POTASSIUM: 3.1 mmol/L — AB (ref 3.5–5.1)
Sodium: 138 mmol/L (ref 135–145)
TOTAL PROTEIN: 8.1 g/dL (ref 6.5–8.1)

## 2017-08-24 LAB — CBC WITH DIFFERENTIAL/PLATELET
BASOS ABS: 0 10*3/uL (ref 0.0–0.1)
BASOS PCT: 0 %
EOS PCT: 1 %
Eosinophils Absolute: 0.1 10*3/uL (ref 0.0–0.7)
HCT: 46.5 % (ref 39.0–52.0)
Hemoglobin: 14.7 g/dL (ref 13.0–17.0)
LYMPHS PCT: 12 %
Lymphs Abs: 1.2 10*3/uL (ref 0.7–4.0)
MCH: 30.2 pg (ref 26.0–34.0)
MCHC: 31.6 g/dL (ref 30.0–36.0)
MCV: 95.7 fL (ref 78.0–100.0)
Monocytes Absolute: 1.1 10*3/uL — ABNORMAL HIGH (ref 0.1–1.0)
Monocytes Relative: 11 %
Neutro Abs: 7.6 10*3/uL (ref 1.7–7.7)
Neutrophils Relative %: 76 %
PLATELETS: 274 10*3/uL (ref 150–400)
RBC: 4.86 MIL/uL (ref 4.22–5.81)
RDW: 14.1 % (ref 11.5–15.5)
WBC: 9.9 10*3/uL (ref 4.0–10.5)

## 2017-08-24 MED ORDER — LIDOCAINE-EPINEPHRINE (PF) 2 %-1:200000 IJ SOLN
20.0000 mL | Freq: Once | INTRAMUSCULAR | Status: DC
  Filled 2017-08-24: qty 20

## 2017-08-24 MED ORDER — OXYCODONE-ACETAMINOPHEN 5-325 MG PO TABS
1.0000 | ORAL_TABLET | ORAL | Status: DC | PRN
  Administered 2017-08-24: 1 via ORAL
  Filled 2017-08-24: qty 1

## 2017-08-24 MED ORDER — POTASSIUM CHLORIDE CRYS ER 20 MEQ PO TBCR
40.0000 meq | EXTENDED_RELEASE_TABLET | Freq: Once | ORAL | Status: AC
  Administered 2017-08-24: 40 meq via ORAL
  Filled 2017-08-24: qty 2

## 2017-08-24 MED ORDER — PREDNISONE 20 MG PO TABS: ORAL_TABLET | ORAL | 0 refills | Status: DC

## 2017-08-24 MED ORDER — HYDROCODONE-ACETAMINOPHEN 5-325 MG PO TABS: 2.0000 | ORAL_TABLET | Freq: Four times a day (QID) | ORAL | 0 refills | Status: DC | PRN

## 2017-08-24 MED ORDER — OXYCODONE-ACETAMINOPHEN 5-325 MG PO TABS: 1.0000 | ORAL_TABLET | Freq: Once | ORAL | Status: DC

## 2017-08-24 NOTE — ED Provider Notes (Signed)
[facility] EMERGENCY DEPARTMENT Provider Note   CSN: [id number] Arrival date & time: 08/24/17  1737     History   Chief Complaint Chief Complaint  Patient presents with  . Knee Pain    HPI Marco [name] is a 62 y.o. male.  The history is provided by the patient and medical records. No language interpreter was used.  Knee Pain   Pertinent negatives include no numbness.   Marco [name] is a 62 y.o. male  with a PMH of HTN, CKD, DM, RA, gout who presents to the Emergency Department complaining of progressively worsening right knee pain x 2-3 days. Patient states that knee began aching a little about 2 weeks ago, but not enough to think much about it. He put a knee sleeve on which somewhat helped. 2-3 days ago, pain acutely worsened and the knee started to swell up. Notes some redness over the top of the knee as well. No fever or chills. Pain is worse with movement or ambulation. Better when still. Does not feel like typical RA flare. He has never had gout in the knee before. He has been compliant with Allopurinol. He did recently have steroids decreased from 69m to 531mdaily for RA.   Past Medical History:  Diagnosis Date  . Benign colon polyp [facility]01/2013   [facility]. polyp was benign with minimal surface hyperplastic change.  . Chronic combined systolic and diastolic CHF (congestive heart failure) ([facility]  . Chronic pain   . CKD (chronic kidney disease), stage III ([facility]CM[facility]  . Diabetes mellitus without complication ([facility]  . Diverticulosis   . Esophageal hiatal hernia 07/29/2013   confirmed on EGD   . Esophageal stricture   . Essential hypertension   . Gastritis 07/29/2013   confirmed on EGD, bx done an negative for intestinal metaplasia, dsyplasia or H. pylori. normal gastric emptying study done 07/13/2013.  . [name] KitchenERD (gastroesophageal reflux disease)   . Morbid obesity (HCLake [place]  .  Non-obstructive CAD    a. 02/2013 Cath ([place] nonobs dzs;  b. 09/2014 [place] EF 55%, no ischemia;  c. 12/2014 Echo: EF 50-55%, gr1 DD, no effusion.  . Persistent atrial fibrillation (HCSurrency   a. 02/2013 [place]prev on Xarelto, d/c'd 2/2 anemia, ? GIB.  . [name] Kitchenheumatoid arthritis (HCSmoaks  . Sigmoid diverticulosis 08/01/2013   confirmed on colonscopy. record scanned into chart    Patient Active Problem List   Diagnosis Date Noted  . Dysphagia 01/09/2017  . Hyperlipidemia 12/23/2016  . Chronic combined systolic and diastolic CHF (congestive heart failure) ([facility]CEven[facility] [place]01/30/2018  . Diabetes mellitus with complication ([facility] [place]  . Diabetic retinopathy (HCYaak12/07/2016  . Diabetic polyneuropathy associated with type 2 diabetes mellitus (HCKingston10/31/2017  . Myalgia and myositis 03/31/2016  . Dyspnea on exertion 02/09/2016  . Abdominal bloating 02/05/2016  . Chronic gouty arthritis 11/29/2015  . Ache in joint 11/27/2015  . LBP (low back pain) 11/27/2015  . Arthralgia of multiple joints 11/27/2015  . Psoriasis 11/27/2015  . Breast pain in male 11/14/2015  . Sinusitis, chronic 10/16/2015  . Insomnia 10/16/2015  . Adult BMI 30+ 10/04/2015  . New onset of headaches after age 58482/04/2016  . OSA (obstructive sleep apnea) 07/12/2015  . Low serum testosterone level 05/18/2015  . Depression 05/16/2015  . Chronic pain   . CAD (coronary artery disease) 12/24/2014  . Persistent atrial fibrillation (  [place]) 12/24/2014  . Essential hypertension 12/24/2014  . CKD (chronic kidney disease) stage 3, GFR 30-59 ml/min (HCC) 12/24/2014  . PUD (peptic ulcer disease) 12/24/2014  . DM2 (diabetes mellitus, type 2) ([place]) 12/24/2014    Past Surgical History:  Procedure Laterality Date  . CARDIAC CATHETERIZATION  05/2014   ablation for atrial fibrillation  . CARDIAC CATHETERIZATION N/A 11/16/2015   Procedure: Left Heart Cath and Coronary Angiography;  Surgeon: Leonie Man, MD;  Location: [facility];  Service: Cardiovascular;  Laterality: N/A;  . COLON RESECTION  09/2013   due to large, abnormal polpy. non cancerous per patient.   . COLON SURGERY  09/2014   colon resection        Home Medications    Prior to Admission medications   Medication Sig Start Date End Date Taking? Authorizing Provider  ACCU-CHEK AVIVA PLUS test strip USE ONE STRIP TO CHECK BLOOD GLUCOSE 3 TIMES DAILY 07/30/16   Funches, Adriana Mccallum, MD  ACCU-CHEK FASTCLIX LANCETS MISC Use a directed to check blood sugar 3 times daily. 04/03/17   Argentina Donovan, [name]  albuterol (PROVENTIL HFA;VENTOLIN HFA) 108 (90 Base) MCG/ACT inhaler Inhale 2 puffs into the lungs every 6 (six) hours as needed for wheezing or shortness of breath. 09/29/16   Funches, Adriana Mccallum, MD  allopurinol (ZYLOPRIM) 100 MG tablet Take 100 mg by mouth 2 (two) times daily. 07/17/17   [provider]  aspirin (EQ ASPIRIN ADULT LOW DOSE) 81 MG EC tablet Take 1 tablet (81 mg total) by mouth daily. Swallow whole. 02/05/17   Funches, Adriana Mccallum, MD  atorvastatin (LIPITOR) 40 MG tablet Take 1 tablet (40 mg total) by mouth daily. 02/05/17   Funches, Adriana Mccallum, MD  BD PEN NEEDLE NANO U/F 32G X 4 MM MISC USE ONE PEN NEEDLE THREE TIMES DAILY 08/25/16   Tresa Garter, MD  Blood Glucose Monitoring Suppl (ACCU-CHEK AVIVA PLUS) w/Device KIT 1 Device by Does not apply route 3 (three) times daily after meals. E11.9 02/05/17   Funches, Adriana Mccallum, MD  Blood Pressure KIT 1 each by Does not apply route 2 (two) times daily. 09/09/16   Funches, Adriana Mccallum, MD  fluorometholone (FML) 0.1 % ophthalmic suspension Place 1 drop into both eyes 3 (three) times daily.    [provider]  furosemide (LASIX) 40 MG tablet Take 1 tablet by mouth every morning Patient taking differently: Take 40 mg by mouth every morning. Take 1 tablet by mouth every morning 02/05/17   Funches, Adriana Mccallum, MD  glucose blood (ACCU-CHEK AVIVA PLUS) test strip 1 each by Other route 3 (three) times  daily. E11.9 02/05/17   Funches, Adriana Mccallum, MD  insulin aspart (NOVOLOG FLEXPEN) 100 UNIT/ML FlexPen Inject 14-24 Units into the skin 3 (three) times daily with meals. 02/05/17   Funches, Adriana Mccallum, MD  Insulin Detemir (LEVEMIR FLEXTOUCH) 100 UNIT/ML Pen Inject 50 Units into the skin daily at 10 pm. 02/05/17   Boykin Nearing, MD  losartan (COZAAR) 50 MG tablet Take 50 mg by mouth daily.    [provider]  metFORMIN (GLUCOPHAGE-XR) 500 MG 24 hr tablet Take 2 tablets (1,000 mg total) by mouth 2 (two) times daily. 02/05/17   Funches, Adriana Mccallum, MD  predniSONE (DELTASONE) 20 MG tablet Take 2 tablets (40 mg total) by mouth daily for three days. Then continue your usual 80m dose daily. 08/24/17   Mollyann Halbert, JOzella Almond [name]  predniSONE (DELTASONE) 5 MG tablet Take 5 mg by mouth daily. 07/17/17   [provider]  pregabalin (LYRICA) 100 MG capsule Take 1 capsule (100 mg total) by mouth 3 (three) times daily. 02/05/17   Funches, Adriana Mccallum, MD  ranitidine (ZANTAC) 150 MG tablet Take 1 tablet (150 mg total) by mouth at bedtime. Patient taking differently: Take 150 mg by mouth daily before breakfast.  02/05/17   Funches, Adriana Mccallum, MD  Secukinumab (COSENTYX) 150 MG/ML SOSY Inject 150 mg into the skin every 28 (twenty-eight) days.  06/02/16   [provider]  zolpidem (AMBIEN CR) 6.25 MG CR tablet Take 1 tablet (6.25 mg total) by mouth at bedtime as needed for sleep. 07/17/17   [name], MD    Family History Family History  Problem Relation Age of Onset  . Hypertension Mother   . Diabetes Mother   . COPD Mother   . Lung cancer Mother        Smoker   . Hypertension Father   . Diabetes Father   . Heart Problems Father   . COPD Father   . Colon cancer Neg Hx   . Stomach cancer Neg Hx     Social History Social History   Tobacco Use  . Smoking status: Former Smoker    Packs/day: 0.50    Years: 10.00    Pack years: 5.00    Types: Cigarettes    Last attempt to quit: 04/11/2008     Years since quitting: 9.3  . Smokeless tobacco: Never Used  Substance Use Topics  . Alcohol use: No    Alcohol/week: 0.0 oz  . Drug use: No     Allergies   Patient has no known allergies.   Review of Systems Review of Systems  Musculoskeletal: Positive for arthralgias and joint swelling.  Skin: Positive for color change.  Neurological: Negative for weakness and numbness.  All other systems reviewed and are negative.    Physical Exam Updated Vital Signs BP 105/76 (BP Location: Left Arm)   Pulse 98   Temp 98.6 F (37 C) (Oral)   Resp 18   Ht _0  (1.676 m)   Wt 100.7 kg (222 lb)   SpO2 98%   BMI 35.83 kg/m   Physical Exam  Constitutional: He is oriented to person, place, and time. He appears well-developed and well-nourished. No distress.  HENT:  Head: Normocephalic and atraumatic.  Neck: Neck supple.  Cardiovascular: Normal rate, regular rhythm and normal heart sounds.  No murmur heard. Pulmonary/Chest: Effort normal and breath sounds normal. No respiratory distress.  Musculoskeletal:  Right knee with moderate swelling and diffuse tenderness. Slight erythema anteriorly and warm to the touch. Decreased ROM. 2+ DP. Sensation intact.   Neurological: He is alert and oriented to person, place, and time.  Skin: Skin is warm and dry.  Nursing note and vitals reviewed.    ED Treatments / Results  Labs (all labs ordered are listed, but only abnormal results are displayed) Labs Reviewed  COMPREHENSIVE METABOLIC PANEL - Abnormal; Notable for the following components:      Result Value   Potassium 3.1 (*)    Chloride 93 (*)    Glucose, Bld 234 (*)    Creatinine, Ser 1.86 (*)    Calcium 8.0 (*)    GFR calc non Af Amer 37 (*)    GFR calc Af Amer 43 (*)    Anion gap 20 (*)    All other components within normal limits  CBC WITH DIFFERENTIAL/PLATELET - Abnormal; Notable for the following components:   Monocytes Absolute 1.1 (*)  All other components within normal  limits  BODY FLUID CULTURE  GRAM STAIN  GLUCOSE, BODY FLUID OTHER  PROTEIN, BODY FLUID (OTHER)  SYNOVIAL CELL COUNT + DIFF, W/ CRYSTALS  URIC ACID, BODY FLUID    EKG  EKG Interpretation None       Radiology Dg Knee Complete 4 Views Right  Result Date: 08/24/2017 CLINICAL DATA:  Patient with right knee pain and swelling for 3 days. EXAM: RIGHT KNEE - COMPLETE 4+ VIEW COMPARISON:  None. FINDINGS: Normal anatomic alignment. No evidence for acute fracture or dislocation. Mild degenerative changes. No joint effusion. Vascular calcifications. IMPRESSION: No acute osseous abnormality. Electronically Signed   By: Lovey Newcomer M.D.   On: 08/24/2017 19:55    Procedures Procedures (including critical care time)  Medications Ordered in ED Medications  oxyCODONE-acetaminophen (PERCOCET/ROXICET) 5-325 MG per tablet 1 tablet (1 tablet Oral Given 08/24/17 1826)  lidocaine-EPINEPHrine (XYLOCAINE W/EPI) 2 %-1:200000 (PF) injection 20 mL (20 mLs Infiltration Not Given 08/24/17 2302)  oxyCODONE-acetaminophen (PERCOCET/ROXICET) 5-325 MG per tablet 1 tablet (not administered)  potassium chloride SA (K-DUR,KLOR-CON) CR tablet 40 mEq (40 mEq Oral Given 08/24/17 2237)     Initial Impression / Assessment and Plan / ED Course  I have reviewed the triage vital signs and the nursing notes.  Pertinent labs & imaging results that were available during my care of the patient were reviewed by me and considered in my medical decision making (see chart for details).    Marco [name] is a 62 y.o. male who presents to ED for 2-3 days of worsening right knee pain with associated swelling. Hx of gout on Allopurinol and RA on 26m prednisone daily. On exam, patient is afebrile, hemodynamically stable with diffuse tenderness to the right knee. The knee does have a small amount of erythema anteriorly and warm to the touch. Decreased ROM. No known injury. Ddx include gout flare, septic joint, RA flare. Considered  knee tap, however very little fluid on the knee to pull. Exam more c/w gout flare. Patient aware to return to ER immediately if fever develops or knee becomes worse. Will treat as gout flare and increase steroids to 4262mx 3 days. On norco 62m38m6h at home - will increase to 13m16mh for the next few days as well. He has an appointment with his pain management doctor in 3 days. All questions answered.   Patient seen by and discussed with Dr. PfeiJohnney Killian agrees with treatment plan.    Final Clinical Impressions(s) / ED Diagnoses   Final diagnoses:  Acute pain of right knee    ED Discharge Orders        Ordered    predniSONE (DELTASONE) 20 MG tablet  Status:  Discontinued     08/24/17 2330    HYDROcodone-acetaminophen (NORCO/VICODIN) 5-325 MG tablet  Every 6 hours PRN,   Status:  Discontinued     08/24/17 2330    predniSONE (DELTASONE) 20 MG tablet     08/24/17 2336       Hager [place]ston, JaimOzella Almond-C 08/24/17 [id number] 08/31/17 [id number]

## 2017-08-24 NOTE — ED Triage Notes (Signed)
Per Pt, Pt is coming from home with complaints of right knee pain and right pain that started a couple days ago. Denies any injury. Reports hx of arthritis.

## 2017-08-24 NOTE — Discharge Instructions (Addendum)
It was my pleasure taking care of you today!   Take 40mg  prednisone for the next 3 days, then continue your usual dose of 5 mg daily.  Keep appointment with your pain management doctor.  Follow up with your primary care doctor.   Return to ER immediately if fever develops or knee becomes more red / swollen. You may also return to ER for new or worsening symptoms, any additional concerns.

## 2017-08-27 ENCOUNTER — Encounter: Payer: Self-pay | Admitting: Internal Medicine

## 2017-08-27 ENCOUNTER — Encounter (HOSPITAL_COMMUNITY): Payer: Self-pay

## 2017-08-27 ENCOUNTER — Other Ambulatory Visit: Payer: Self-pay

## 2017-08-27 ENCOUNTER — Ambulatory Visit (HOSPITAL_BASED_OUTPATIENT_CLINIC_OR_DEPARTMENT_OTHER): Payer: Medicaid Other | Admitting: Internal Medicine

## 2017-08-27 ENCOUNTER — Inpatient Hospital Stay (HOSPITAL_COMMUNITY)
Admission: EM | Admit: 2017-08-27 | Discharge: 2017-08-31 | DRG: 683 | Disposition: A | Discharge status: Home / Self Care | Payer: Medicaid Other | Attending: Internal Medicine | Admitting: Internal Medicine

## 2017-08-27 VITALS — BP 142/79 | HR 73 | Resp 16

## 2017-08-27 DIAGNOSIS — E118 Type 2 diabetes mellitus with unspecified complications: Secondary | ICD-10-CM | POA: Diagnosis not present

## 2017-08-27 DIAGNOSIS — G8929 Other chronic pain: Secondary | ICD-10-CM | POA: Diagnosis present

## 2017-08-27 DIAGNOSIS — M25561 Pain in right knee: Secondary | ICD-10-CM | POA: Diagnosis present

## 2017-08-27 DIAGNOSIS — Z7982 Long term (current) use of aspirin: Secondary | ICD-10-CM

## 2017-08-27 DIAGNOSIS — E876 Hypokalemia: Secondary | ICD-10-CM | POA: Diagnosis present

## 2017-08-27 DIAGNOSIS — M069 Rheumatoid arthritis, unspecified: Secondary | ICD-10-CM | POA: Diagnosis present

## 2017-08-27 DIAGNOSIS — I2510 Atherosclerotic heart disease of native coronary artery without angina pectoris: Secondary | ICD-10-CM

## 2017-08-27 DIAGNOSIS — N179 Acute kidney failure, unspecified: Secondary | ICD-10-CM | POA: Diagnosis not present

## 2017-08-27 DIAGNOSIS — Z833 Family history of diabetes mellitus: Secondary | ICD-10-CM

## 2017-08-27 DIAGNOSIS — F329 Major depressive disorder, single episode, unspecified: Secondary | ICD-10-CM

## 2017-08-27 DIAGNOSIS — M1A9XX0 Chronic gout, unspecified, without tophus (tophi): Secondary | ICD-10-CM | POA: Diagnosis present

## 2017-08-27 DIAGNOSIS — I5042 Chronic combined systolic (congestive) and diastolic (congestive) heart failure: Secondary | ICD-10-CM | POA: Insufficient documentation

## 2017-08-27 DIAGNOSIS — I130 Hypertensive heart and chronic kidney disease with heart failure and stage 1 through stage 4 chronic kidney disease, or unspecified chronic kidney disease: Secondary | ICD-10-CM | POA: Diagnosis present

## 2017-08-27 DIAGNOSIS — Z87891 Personal history of nicotine dependence: Secondary | ICD-10-CM | POA: Insufficient documentation

## 2017-08-27 DIAGNOSIS — K219 Gastro-esophageal reflux disease without esophagitis: Secondary | ICD-10-CM | POA: Diagnosis present

## 2017-08-27 DIAGNOSIS — F411 Generalized anxiety disorder: Secondary | ICD-10-CM | POA: Diagnosis present

## 2017-08-27 DIAGNOSIS — Z794 Long term (current) use of insulin: Secondary | ICD-10-CM

## 2017-08-27 DIAGNOSIS — E669 Obesity, unspecified: Secondary | ICD-10-CM | POA: Insufficient documentation

## 2017-08-27 DIAGNOSIS — G253 Myoclonus: Secondary | ICD-10-CM

## 2017-08-27 DIAGNOSIS — R079 Chest pain, unspecified: Secondary | ICD-10-CM | POA: Diagnosis present

## 2017-08-27 DIAGNOSIS — N390 Urinary tract infection, site not specified: Secondary | ICD-10-CM | POA: Diagnosis present

## 2017-08-27 DIAGNOSIS — I480 Paroxysmal atrial fibrillation: Secondary | ICD-10-CM | POA: Diagnosis present

## 2017-08-27 DIAGNOSIS — E1142 Type 2 diabetes mellitus with diabetic polyneuropathy: Secondary | ICD-10-CM

## 2017-08-27 DIAGNOSIS — Z86010 Personal history of colonic polyps: Secondary | ICD-10-CM

## 2017-08-27 DIAGNOSIS — E785 Hyperlipidemia, unspecified: Secondary | ICD-10-CM

## 2017-08-27 DIAGNOSIS — I481 Persistent atrial fibrillation: Secondary | ICD-10-CM | POA: Insufficient documentation

## 2017-08-27 DIAGNOSIS — E11319 Type 2 diabetes mellitus with unspecified diabetic retinopathy without macular edema: Secondary | ICD-10-CM

## 2017-08-27 DIAGNOSIS — G4733 Obstructive sleep apnea (adult) (pediatric): Secondary | ICD-10-CM

## 2017-08-27 DIAGNOSIS — Z79899 Other long term (current) drug therapy: Secondary | ICD-10-CM | POA: Insufficient documentation

## 2017-08-27 DIAGNOSIS — E1122 Type 2 diabetes mellitus with diabetic chronic kidney disease: Secondary | ICD-10-CM | POA: Insufficient documentation

## 2017-08-27 DIAGNOSIS — N183 Chronic kidney disease, stage 3 unspecified: Secondary | ICD-10-CM | POA: Diagnosis present

## 2017-08-27 DIAGNOSIS — E119 Type 2 diabetes mellitus without complications: Secondary | ICD-10-CM

## 2017-08-27 DIAGNOSIS — M1A00X0 Idiopathic chronic gout, unspecified site, without tophus (tophi): Secondary | ICD-10-CM | POA: Diagnosis present

## 2017-08-27 DIAGNOSIS — I10 Essential (primary) hypertension: Secondary | ICD-10-CM | POA: Diagnosis present

## 2017-08-27 DIAGNOSIS — Z7952 Long term (current) use of systemic steroids: Secondary | ICD-10-CM

## 2017-08-27 DIAGNOSIS — R251 Tremor, unspecified: Secondary | ICD-10-CM | POA: Diagnosis present

## 2017-08-27 DIAGNOSIS — I4891 Unspecified atrial fibrillation: Secondary | ICD-10-CM | POA: Diagnosis present

## 2017-08-27 DIAGNOSIS — Z9119 Patient's noncompliance with other medical treatment and regimen: Secondary | ICD-10-CM

## 2017-08-27 DIAGNOSIS — N189 Chronic kidney disease, unspecified: Secondary | ICD-10-CM

## 2017-08-27 LAB — URINALYSIS, ROUTINE W REFLEX MICROSCOPIC
Bilirubin Urine: NEGATIVE
Glucose, UA: NEGATIVE mg/dL
Hgb urine dipstick: NEGATIVE
Ketones, ur: NEGATIVE mg/dL
NITRITE: NEGATIVE
PH: 5 (ref 5.0–8.0)
Protein, ur: NEGATIVE mg/dL
SPECIFIC GRAVITY, URINE: 1.018 (ref 1.005–1.030)

## 2017-08-27 LAB — BASIC METABOLIC PANEL
Anion gap: 22 — ABNORMAL HIGH (ref 5–15)
BUN: 43 mg/dL — AB (ref 6–20)
CALCIUM: 8.3 mg/dL — AB (ref 8.9–10.3)
CHLORIDE: 96 mmol/L — AB (ref 101–111)
CO2: 20 mmol/L — ABNORMAL LOW (ref 22–32)
CREATININE: 3.4 mg/dL — AB (ref 0.61–1.24)
GFR calc Af Amer: 21 mL/min — ABNORMAL LOW (ref >60)
GFR calc non Af Amer: 18 mL/min — ABNORMAL LOW (ref >60)
Glucose, Bld: 262 mg/dL — ABNORMAL HIGH (ref 65–99)
Potassium: 3.5 mmol/L (ref 3.5–5.1)
SODIUM: 138 mmol/L (ref 135–145)

## 2017-08-27 LAB — HEPATIC FUNCTION PANEL
ALT: 20 U/L (ref 17–63)
AST: 36 U/L (ref 15–41)
Albumin: 3.4 g/dL — ABNORMAL LOW (ref 3.5–5.0)
Alkaline Phosphatase: 36 U/L — ABNORMAL LOW (ref 38–126)
Total Bilirubin: 0.7 mg/dL (ref 0.3–1.2)
Total Protein: 7.6 g/dL (ref 6.5–8.1)

## 2017-08-27 LAB — CBC
HCT: 39.8 % (ref 39.0–52.0)
Hemoglobin: 12.9 g/dL — ABNORMAL LOW (ref 13.0–17.0)
MCH: 30.6 pg (ref 26.0–34.0)
MCHC: 32.4 g/dL (ref 30.0–36.0)
MCV: 94.3 fL (ref 78.0–100.0)
PLATELETS: 277 10*3/uL (ref 150–400)
RBC: 4.22 MIL/uL (ref 4.22–5.81)
RDW: 14.3 % (ref 11.5–15.5)
WBC: 13.3 10*3/uL — AB (ref 4.0–10.5)

## 2017-08-27 LAB — POCT GLYCOSYLATED HEMOGLOBIN (HGB A1C): Hemoglobin A1C: 7.9

## 2017-08-27 LAB — AMMONIA: Ammonia: 25 umol/L (ref 9–35)

## 2017-08-27 LAB — GLUCOSE, POCT (MANUAL RESULT ENTRY): POC GLUCOSE: 192 mg/dL — AB (ref 70–99)

## 2017-08-27 LAB — SALICYLATE LEVEL: Salicylate Lvl: <7 mg/dL (ref 2.8–30.0)

## 2017-08-27 MED ORDER — INSULIN DETEMIR 100 UNIT/ML ~~LOC~~ SOLN
30.0000 [IU] | Freq: Every day | SUBCUTANEOUS | Status: DC
  Administered 2017-08-28: 30 [IU] via SUBCUTANEOUS
  Filled 2017-08-27 (×2): qty 0.3

## 2017-08-27 MED ORDER — PREDNISONE 5 MG PO TABS
5.0000 mg | ORAL_TABLET | Freq: Every day | ORAL | Status: DC
  Administered 2017-08-28 – 2017-08-30 (×3): 5 mg via ORAL
  Filled 2017-08-27 (×3): qty 1

## 2017-08-27 MED ORDER — ATORVASTATIN CALCIUM 40 MG PO TABS
40.0000 mg | ORAL_TABLET | Freq: Every day | ORAL | Status: DC
  Administered 2017-08-28 – 2017-08-31 (×4): 40 mg via ORAL
  Filled 2017-08-27 (×4): qty 1

## 2017-08-27 MED ORDER — ALLOPURINOL 100 MG PO TABS
100.0000 mg | ORAL_TABLET | Freq: Two times a day (BID) | ORAL | Status: DC
  Administered 2017-08-28 – 2017-08-31 (×6): 100 mg via ORAL
  Filled 2017-08-27 (×8): qty 1

## 2017-08-27 MED ORDER — HEPARIN SODIUM (PORCINE) 5000 UNIT/ML IJ SOLN
5000.0000 [IU] | Freq: Three times a day (TID) | INTRAMUSCULAR | Status: DC
Start: 2017-08-28 — End: 2017-08-31
  Administered 2017-08-28 – 2017-08-31 (×9): 5000 [IU] via SUBCUTANEOUS
  Filled 2017-08-27 (×9): qty 1

## 2017-08-27 MED ORDER — SODIUM CHLORIDE 0.9 % IV BOLUS (SEPSIS)
1000.0000 mL | Freq: Once | INTRAVENOUS | Status: AC
  Administered 2017-08-27: 1000 mL via INTRAVENOUS

## 2017-08-27 MED ORDER — ONDANSETRON HCL 4 MG PO TABS: 4.0000 mg | ORAL_TABLET | Freq: Four times a day (QID) | ORAL | Status: DC | PRN

## 2017-08-27 MED ORDER — ACETAMINOPHEN 325 MG PO TABS
650.0000 mg | ORAL_TABLET | Freq: Four times a day (QID) | ORAL | Status: DC | PRN
  Administered 2017-08-30: 650 mg via ORAL
  Filled 2017-08-27: qty 2

## 2017-08-27 MED ORDER — ACETAMINOPHEN 650 MG RE SUPP: 650.0000 mg | Freq: Four times a day (QID) | RECTAL | Status: DC | PRN

## 2017-08-27 MED ORDER — ZOLPIDEM TARTRATE 5 MG PO TABS
5.0000 mg | ORAL_TABLET | Freq: Every evening | ORAL | Status: DC | PRN
  Filled 2017-08-27 (×2): qty 1

## 2017-08-27 MED ORDER — INSULIN ASPART 100 UNIT/ML ~~LOC~~ SOLN
0.0000 [IU] | Freq: Three times a day (TID) | SUBCUTANEOUS | Status: DC
  Administered 2017-08-28: 3 [IU] via SUBCUTANEOUS
  Administered 2017-08-28: 1 [IU] via SUBCUTANEOUS
  Administered 2017-08-28: 2 [IU] via SUBCUTANEOUS
  Administered 2017-08-29: 3 [IU] via SUBCUTANEOUS
  Administered 2017-08-29: 2 [IU] via SUBCUTANEOUS
  Administered 2017-08-29: 1 [IU] via SUBCUTANEOUS
  Administered 2017-08-30: 2 [IU] via SUBCUTANEOUS
  Administered 2017-08-30: 1 [IU] via SUBCUTANEOUS
  Administered 2017-08-31: 2 [IU] via SUBCUTANEOUS
  Administered 2017-08-31: 7 [IU] via SUBCUTANEOUS
  Filled 2017-08-27 (×2): qty 1

## 2017-08-27 MED ORDER — ONDANSETRON HCL 4 MG/2ML IJ SOLN
4.0000 mg | Freq: Four times a day (QID) | INTRAMUSCULAR | Status: DC | PRN
Start: 2017-08-27 — End: 2017-08-31
  Filled 2017-08-27: qty 2

## 2017-08-27 MED ORDER — ASPIRIN EC 81 MG PO TBEC
81.0000 mg | DELAYED_RELEASE_TABLET | Freq: Every day | ORAL | Status: DC
  Administered 2017-08-28 – 2017-08-31 (×4): 81 mg via ORAL
  Filled 2017-08-27 (×4): qty 1

## 2017-08-27 MED ORDER — SODIUM CHLORIDE 0.9 % IV SOLN
INTRAVENOUS | Status: AC
  Administered 2017-08-27: via INTRAVENOUS

## 2017-08-27 NOTE — ED Triage Notes (Signed)
[facility]- Pt coming from PCP with complaint of tremors throughout his body X3 days. Pt alert and oriented. Able to transfer from stretcher to wheelchair. No falls or injuries reported. Pt was dx with gout 3 days ago and started new medications.   148/92, 95%, 74bpm.

## 2017-08-27 NOTE — Progress Notes (Signed)
Involuntarily jerking in extremities. He noticed this Monday and Tuesday in his hands. Since yesterday jerking has progressed and recognizes it all over. He takes diuretic.  Last medications added  to regimen was Allopurinol. He reports he was in the ED for knee pain

## 2017-08-27 NOTE — H&P (Addendum)
History and Physical    Keric Zehren ZDG:[id number] DOB: [birth year] DOA: 08/27/2017  PCP: [name], MD  Patient coming from: Home.  Chief Complaint: Jerking motions of the upper extremities.  HPI: Marco [name] is a 62 y.o. male with history of diabetes mellitus type 2, chronic kidney disease stage III, chronic pain, sleep apnea, chronic combined CHF, atrial fibrillation has been noticing last 3 days of increasing jerking movements of upper extremities.  Patient states that recently had gouty attack and was placed on increased dose of prednisone for 3 days which he had taken and his gout attack improved.  Last month he was placed on allopurinol for his gout.  Patient had few episodes of diarrhea last week.  ED Course: In the ER patient is found to have jerking movements of the upper extremities which is consistent with myoclonic jerks.  Patient's labs show increased creatinine from his baseline of 1.8-3.4 now.  Patient was given 1 L fluid bolus and admitted for acute on chronic renal failure and myoclonic jerks.  Review of Systems: As per HPI, rest all negative.   Past Medical History:  Diagnosis Date  . Benign colon polyp 08/01/2013   [facility] in [place]. large base tranverse colon polyp was biopsied. polyp was benign with minimal surface hyperplastic change.  . Chronic combined systolic and diastolic CHF (congestive heart failure) ([place])   . Chronic pain   . CKD (chronic kidney disease), stage III ([place])   . Diabetes mellitus without complication ([place])   . Diverticulosis   . Esophageal hiatal hernia 07/29/2013   confirmed on EGD   . Esophageal stricture   . Essential hypertension   . Gastritis 07/29/2013   confirmed on EGD, bx done an negative for intestinal metaplasia, dsyplasia or H. pylori. normal gastric emptying study done 07/13/2013.  [name] GERD (gastroesophageal reflux disease)   . Morbid obesity ([place])   . Non-obstructive CAD    a. 02/2013 Cath ([place]):  nonobs dzs;  b. 09/2014 Myoview ([place]): EF 55%, no ischemia;  c. 12/2014 Echo: EF 50-55%, gr1 DD, no effusion.  . Persistent atrial fibrillation ([place])    a. 02/2013 s/p rfca in [place], [place]-->prev on Xarelto, d/c'd 2/2 anemia, ? GIB.  [name] Rheumatoid arthritis ([place])   . Sigmoid diverticulosis 08/01/2013   confirmed on colonscopy. record scanned into chart    Past Surgical History:  Procedure Laterality Date  . CARDIAC CATHETERIZATION  05/2014   ablation for atrial fibrillation  . CARDIAC CATHETERIZATION N/A 11/16/2015   Procedure: Left Heart Cath and Coronary Angiography;  Surgeon: Leonie Man, MD;  Location: [facility];  Service: Cardiovascular;  Laterality: N/A;  . COLON RESECTION  09/2013   due to large, abnormal polpy. non cancerous per patient.   . COLON SURGERY  09/2014   colon resection      reports that he quit smoking about 9 years ago. His smoking use included cigarettes. He has a 5.00 pack-year smoking history. he has never used smokeless tobacco. He reports that he does not drink alcohol or use drugs.  No Known Allergies  Family History  Problem Relation Age of Onset  . Hypertension Mother   . Diabetes Mother   . COPD Mother   . Lung cancer Mother        Smoker   . Hypertension Father   . Diabetes Father   . Heart Problems Father   . COPD Father   . Colon cancer Neg Hx   .  Stomach cancer Neg Hx     Prior to Admission medications   Medication Sig Start Date End Date Taking? Authorizing Provider  allopurinol (ZYLOPRIM) 100 MG tablet Take 100 mg by mouth 2 (two) times daily. 07/17/17  Yes [provider]  aspirin (EQ ASPIRIN ADULT LOW DOSE) 81 MG EC tablet Take 1 tablet (81 mg total) by mouth daily. Swallow whole. 02/05/17  Yes Funches, Josalyn, MD  atorvastatin (LIPITOR) 40 MG tablet Take 1 tablet (40 mg total) by mouth daily. 02/05/17  Yes Funches, Josalyn, MD  furosemide (LASIX) 40 MG tablet Take 1 tablet by mouth every morning Patient taking  differently: Take 40 mg by mouth every morning. Take 1 tablet by mouth every morning 02/05/17  Yes Funches, Josalyn, MD  insulin aspart (NOVOLOG FLEXPEN) 100 UNIT/ML FlexPen Inject 14-24 Units into the skin 3 (three) times daily with meals. 02/05/17  Yes Funches, Josalyn, MD  Insulin Detemir (LEVEMIR FLEXTOUCH) 100 UNIT/ML Pen Inject 50 Units into the skin daily at 10 pm. 02/05/17  Yes Funches, Josalyn, MD  losartan (COZAAR) 50 MG tablet Take 50 mg by mouth daily.   Yes [provider]  metFORMIN (GLUCOPHAGE-XR) 500 MG 24 hr tablet Take 2 tablets (1,000 mg total) by mouth 2 (two) times daily. 02/05/17  Yes Funches, Josalyn, MD  predniSONE (DELTASONE) 5 MG tablet Take 5 mg by mouth daily. 07/17/17  Yes [provider]  pregabalin (LYRICA) 100 MG capsule Take 1 capsule (100 mg total) by mouth 3 (three) times daily. 02/05/17  Yes Funches, Josalyn, MD  ranitidine (ZANTAC) 150 MG tablet Take 1 tablet (150 mg total) by mouth at bedtime. Patient taking differently: Take 150 mg by mouth daily before breakfast.  02/05/17  Yes Funches, Josalyn, MD  Secukinumab (COSENTYX) 150 MG/ML SOSY Inject 150 mg into the skin every 28 (twenty-eight) days.  06/02/16  Yes [provider]  zolpidem (AMBIEN CR) 6.25 MG CR tablet Take 1 tablet (6.25 mg total) by mouth at bedtime as needed for sleep. 07/17/17  Yes [name], MD  ACCU-CHEK AVIVA PLUS test strip USE ONE STRIP TO CHECK BLOOD GLUCOSE 3 TIMES DAILY 07/30/16   Boykin Nearing, MD  ACCU-CHEK FASTCLIX LANCETS MISC Use a directed to check blood sugar 3 times daily. 04/03/17   Argentina Donovan, [name]  albuterol (PROVENTIL HFA;VENTOLIN HFA) 108 (90 Base) MCG/ACT inhaler Inhale 2 puffs into the lungs every 6 (six) hours as needed for wheezing or shortness of breath. Patient not taking: Reported on 08/27/2017 09/29/16   Boykin Nearing, MD  BD PEN NEEDLE NANO U/F 32G X 4 MM MISC USE ONE PEN NEEDLE THREE TIMES DAILY 08/25/16   Tresa Garter, MD   Blood Glucose Monitoring Suppl (ACCU-CHEK AVIVA PLUS) w/Device KIT 1 Device by Does not apply route 3 (three) times daily after meals. E11.9 02/05/17   Funches, Adriana Mccallum, MD  Blood Pressure KIT 1 each by Does not apply route 2 (two) times daily. 09/09/16   Funches, Adriana Mccallum, MD  glucose blood (ACCU-CHEK AVIVA PLUS) test strip 1 each by Other route 3 (three) times daily. E11.9 02/05/17   Funches, Adriana Mccallum, MD  predniSONE (DELTASONE) 20 MG tablet Take 2 tablets (40 mg total) by mouth daily for three days. Then continue your usual 20m dose daily. Patient not taking: Reported on 08/27/2017 08/24/17   Ward, JOzella Almond [name]    Physical Exam: Vitals:   08/27/17 1549 08/27/17 1853 08/27/17 2057 08/27/17 2130  BP: (!) 149/86 139/88 (!) 123/97 (!) 148/83  Pulse: 76 79 (!) 130 85  Resp: _0 Temp: 98.3 F (36.8 C)     TempSrc: Oral     SpO2: 98% 95% 97% 97%      Constitutional: Moderately built and nourished. Vitals:   08/27/17 1549 08/27/17 1853 08/27/17 2057 08/27/17 2130  BP: (!) 149/86 139/88 (!) 123/97 (!) 148/83  Pulse: 76 79 (!) 130 85  Resp: _1 Temp: 98.3 F (36.8 C)     TempSrc: Oral     SpO2: 98% 95% 97% 97%   Eyes: Anicteric no pallor. ENMT: No discharge from the ears eyes nose or mouth. Neck: No mass felt.  No neck rigidity. Respiratory: No rhonchi or crepitations. Cardiovascular: S1-S2 heard no murmurs appreciated. Abdomen: Soft nontender bowel sounds present.  No guarding or rigidity. Musculoskeletal: No edema.  No joint effusion. Skin: No rash.  Skin appears warm. Neurologic: Alert awake oriented to time place and person.  Moves all extremities.  Has myoclonic jerks. Psychiatric: Appears normal.  Normal affect.   Labs on Admission: I have personally reviewed following labs and imaging studies  CBC: Recent Labs  Lab 08/24/17 1819 08/27/17 1600  WBC 9.9 13.3*  NEUTROABS 7.6  --   HGB 14.7 12.9*  HCT 46.5 39.8  MCV 95.7 94.3  PLT 274 510    Basic Metabolic Panel: Recent Labs  Lab 08/24/17 1819 08/27/17 1600  NA 138 138  K 3.1* 3.5  CL 93* 96*  CO2 25 20*  GLUCOSE 234* 262*  BUN 13 43*  CREATININE 1.86* 3.40*  CALCIUM 8.0* 8.3*   GFR: Estimated Creatinine Clearance: 25 mL/min (A) (by C-G formula based on SCr of 3.4 mg/dL (H)). Liver Function Tests: Recent Labs  Lab 08/24/17 1819 08/27/17 2140  AST 31 36  ALT 17 20  ALKPHOS 38 36*  BILITOT 0.8 0.7  PROT 8.1 7.6  ALBUMIN 3.8 3.4*   No results for input(s): LIPASE, AMYLASE in the last 168 hours. Recent Labs  Lab 08/27/17 2140  AMMONIA 25   Coagulation Profile: No results for input(s): INR, PROTIME in the last 168 hours. Cardiac Enzymes: No results for input(s): CKTOTAL, CKMB, CKMBINDEX, TROPONINI in the last 168 hours. BNP (last 3 results) Recent Labs    09/25/16 1122 12/23/16 1333 01/01/17 1038  PROBNP 208 286* 180   HbA1C: Recent Labs    08/27/17 1408  HGBA1C 7.9   CBG: No results for input(s): GLUCAP in the last 168 hours. Lipid Profile: No results for input(s): CHOL, HDL, LDLCALC, TRIG, CHOLHDL, LDLDIRECT in the last 72 hours. Thyroid Function Tests: No results for input(s): TSH, T4TOTAL, FREET4, T3FREE, THYROIDAB in the last 72 hours. Anemia Panel: No results for input(s): VITAMINB12, FOLATE, FERRITIN, TIBC, IRON, RETICCTPCT in the last 72 hours. Urine analysis:    Component Value Date/Time   COLORURINE COLORLESS (A) 09/02/2016 1843   APPEARANCEUR CLEAR 09/02/2016 1843   LABSPEC 1.012 09/02/2016 1843   PHURINE 5.0 09/02/2016 1843   GLUCOSEU NEGATIVE 09/02/2016 1843   HGBUR NEGATIVE 09/02/2016 1843   BILIRUBINUR negative 12/04/2016 1639   KETONESUR NEGATIVE 09/02/2016 1843   PROTEINUR negative 12/04/2016 1639   PROTEINUR NEGATIVE 09/02/2016 1843   UROBILINOGEN 0.2 12/04/2016 1639   UROBILINOGEN 0.2 04/14/2015 1932   NITRITE negative 12/04/2016 1639   NITRITE NEGATIVE 09/02/2016 1843   LEUKOCYTESUR Trace (A) 12/04/2016 1639    Sepsis Labs: _2 (procalcitonin:4,lacticidven:4) )No results found for this or any previous visit (from the past 240 hour(s)).  Radiological Exams on Admission: No results found.   Assessment/Plan Principal Problem:   ARF (acute renal failure) (HCC) Active Problems:   Persistent atrial fibrillation (HCC)   Essential hypertension   DM2 (diabetes mellitus, type 2) (HCC)   OSA (obstructive sleep apnea)   Chronic gouty arthritis   Renal failure (ARF), acute on chronic (HCC)   Myoclonic jerking    1. Acute on chronic renal failure stage III -exact cause of his worsening renal function is not clear but patient states he has had few episodes of diarrhea last week.  Will check FENa.  We will continue to hydrate gently.  Patient did receive 1 L fluid bolus in the ER.  I have placed patient on normal saline 75 cc/h.  Hold Lasix and discontinue ARB for now.  Closely follow intake output metabolic panel.  If creatinine does not improve may have to consider doing a renal sonogram to rule out any obstruction. 2. Myoclonic jerks -likely secondary to metabolic reasons with renal failure.  We will hold Lyrica for now since he is cleared by kidneys. 3. Diabetes mellitus type 2 -since patient has worsening renal function I have decreased patient's Levemir dose from 50-30 units.  Patient has been placed on sliding scale coverage.  Hold metformin due to worsening renal function. 4. Sleep apnea on CPAP.  Has had previous episodes of noncompliance. 5. Hypertension and history of combined systolic and diastolic CHF -presently receiving fluids due to renal failure.  Hold Lasix and Cozaar due to renal failure.  PRN IV hydralazine for systolic blood pressure more than 160. 6. History of atrial fibrillation -has had previous episodes of GI bleed so was not on anticoagulation.  Rate control at this time. 7. History of gout and rheumatoid arthritis on Cosyntex and prednisone.  Patient is also on allopurinol.   If creatinine does not improve allopurinol may have to be discontinued. 8. Possible UTI on ceftriaxone.  Follow urine cultures. 9. Hyperlipidemia on statins.   DVT prophylaxis: Heparin. Code Status: Full code. Family Communication: Discussed with patient. Disposition Plan: Home. Consults called: None. Admission status: Observation.   Rise Patience MD [facility] Pager [phone].  If 7PM-7AM, please contact night-coverage [email or web address] Password [email or web address]  08/27/2017, 11:01 PM

## 2017-08-27 NOTE — ED Provider Notes (Signed)
[facility] EMERGENCY DEPARTMENT Provider Note   CSN: [id number] Arrival date & time: 08/27/17  1543     History   Chief Complaint Chief Complaint  Patient presents with  . Tremors    HPI Marco [name] is a 62 y.o. male.  HPI  The patient is a 62-year-old male, he has a known history of chronic kidney disease stage III, diabetes, congestive heart failure and has a history of hypertension as well.  He has rheumatoid arthritis and is on Cosentyx as well as prednisone currently taking 40 mg a day.  The patient has a history of gout and unfortunately has struggled with this intermittently and recently has been increasing prednisone to help with the pain in his right knee, states that it is improving his pain gradually.  He reports that over the last 24 hours he developed increased amounts of myoclonic jerking which is uncontrollable, this tremor has been difficult to deal with, he has no associated fevers or chills but has had some discomfort with urination and intermittent diarrhea which is not a new problem but has been occurring over time.  He denies any other new medications recently.  Past Medical History:  Diagnosis Date  . Benign colon polyp 08/01/2013   [facility] in [place]. large base tranverse colon polyp was biopsied. polyp was benign with minimal surface hyperplastic change.  . Chronic combined systolic and diastolic CHF (congestive heart failure) (HCC)   . Chronic pain   . CKD (chronic kidney disease), stage III (HCC)   . Diabetes mellitus without complication (HCC)   . Diverticulosis   . Esophageal hiatal hernia 07/29/2013   confirmed on EGD   . Esophageal stricture   . Essential hypertension   . Gastritis 07/29/2013   confirmed on EGD, bx done an negative for intestinal metaplasia, dsyplasia or H. pylori. normal gastric emptying study done 07/13/2013.  . GERD (gastroesophageal reflux disease)   . Morbid obesity (HCC)   .  Non-obstructive CAD    a. 02/2013 Cath ([place]): nonobs dzs;  b. 09/2014 Myoview ([place]): EF 55%, no ischemia;  c. 12/2014 Echo: EF 50-55%, gr1 DD, no effusion.  . Persistent atrial fibrillation (HCC)    a. 02/2013 s/p rfca in [place], [place]-->prev on Xarelto, d/c'd 2/2 anemia, ? GIB.  . Rheumatoid arthritis (HCC)   . Sigmoid diverticulosis 08/01/2013   confirmed on colonscopy. record scanned into chart    Patient Active Problem List   Diagnosis Date Noted  . ARF (acute renal failure) (HCC) 08/27/2017  . Renal failure (ARF), acute on chronic (HCC) 08/27/2017  . Myoclonic jerking 08/27/2017  . Dysphagia 01/09/2017  . Hyperlipidemia 12/23/2016  . Chronic combined systolic and diastolic CHF (congestive heart failure) (HCC) 09/09/2016  . Diabetes mellitus with complication (HCC)   . Diabetic retinopathy (HCC) 07/22/2016  . Diabetic polyneuropathy associated with type 2 diabetes mellitus (HCC) 06/10/2016  . Myalgia and myositis 03/31/2016  . Dyspnea on exertion 02/09/2016  . Abdominal bloating 02/05/2016  . Chronic gouty arthritis 11/29/2015  . Ache in joint 11/27/2015  . LBP (low back pain) 11/27/2015  . Arthralgia of multiple joints 11/27/2015  . Psoriasis 11/27/2015  . Breast pain in male 11/14/2015  . Sinusitis, chronic 10/16/2015  . Insomnia 10/16/2015  . Adult BMI 30+ 10/04/2015  . New onset of headaches after age 50 09/20/2015  . OSA (obstructive sleep apnea) 07/12/2015  . Low serum testosterone level 05/18/2015  . Depression 05/16/2015  . Chronic pain   .   CAD (coronary artery disease) 12/24/2014  . Persistent atrial fibrillation (HCC) 12/24/2014  . Essential hypertension 12/24/2014  . CKD (chronic kidney disease) stage 3, GFR 30-59 ml/min (HCC) 12/24/2014  . PUD (peptic ulcer disease) 12/24/2014  . DM2 (diabetes mellitus, type 2) (HCC) 12/24/2014    Past Surgical History:  Procedure Laterality Date  . CARDIAC CATHETERIZATION  05/2014   ablation for atrial fibrillation  .  CARDIAC CATHETERIZATION N/A 11/16/2015   Procedure: Left Heart Cath and Coronary Angiography;  Surgeon: David W Harding, MD;  Location: [facility] INVASIVE CV LAB;  Service: Cardiovascular;  Laterality: N/A;  . COLON RESECTION  09/2013   due to large, abnormal polpy. non cancerous per patient.   . COLON SURGERY  09/2014   colon resection        Home Medications    Prior to Admission medications   Medication Sig Start Date End Date Taking? Authorizing Provider  allopurinol (ZYLOPRIM) 100 MG tablet Take 100 mg by mouth 2 (two) times daily. 07/17/17  Yes [provider]  aspirin (EQ ASPIRIN ADULT LOW DOSE) 81 MG EC tablet Take 1 tablet (81 mg total) by mouth daily. Swallow whole. 02/05/17  Yes Funches, Josalyn, MD  atorvastatin (LIPITOR) 40 MG tablet Take 1 tablet (40 mg total) by mouth daily. 02/05/17  Yes Funches, Josalyn, MD  furosemide (LASIX) 40 MG tablet Take 1 tablet by mouth every morning Patient taking differently: Take 40 mg by mouth every morning. Take 1 tablet by mouth every morning 02/05/17  Yes Funches, Josalyn, MD  insulin aspart (NOVOLOG FLEXPEN) 100 UNIT/ML FlexPen Inject 14-24 Units into the skin 3 (three) times daily with meals. 02/05/17  Yes Funches, Josalyn, MD  Insulin Detemir (LEVEMIR FLEXTOUCH) 100 UNIT/ML Pen Inject 50 Units into the skin daily at 10 pm. 02/05/17  Yes Funches, Josalyn, MD  losartan (COZAAR) 50 MG tablet Take 50 mg by mouth daily.   Yes [provider]  metFORMIN (GLUCOPHAGE-XR) 500 MG 24 hr tablet Take 2 tablets (1,000 mg total) by mouth 2 (two) times daily. 02/05/17  Yes Funches, Josalyn, MD  predniSONE (DELTASONE) 5 MG tablet Take 5 mg by mouth daily. 07/17/17  Yes [provider]  pregabalin (LYRICA) 100 MG capsule Take 1 capsule (100 mg total) by mouth 3 (three) times daily. 02/05/17  Yes Funches, Josalyn, MD  ranitidine (ZANTAC) 150 MG tablet Take 1 tablet (150 mg total) by mouth at bedtime. Patient taking differently: Take 150 mg by mouth  daily before breakfast.  02/05/17  Yes Funches, Josalyn, MD  Secukinumab (COSENTYX) 150 MG/ML SOSY Inject 150 mg into the skin every 28 (twenty-eight) days.  06/02/16  Yes [provider]  zolpidem (AMBIEN CR) 6.25 MG CR tablet Take 1 tablet (6.25 mg total) by mouth at bedtime as needed for sleep. 07/17/17  Yes Johnson, Deborah B, MD  ACCU-CHEK AVIVA PLUS test strip USE ONE STRIP TO CHECK BLOOD GLUCOSE 3 TIMES DAILY 07/30/16   Funches, Josalyn, MD  ACCU-CHEK FASTCLIX LANCETS MISC Use a directed to check blood sugar 3 times daily. 04/03/17   McClung, Angela M, [name]  albuterol (PROVENTIL HFA;VENTOLIN HFA) 108 (90 Base) MCG/ACT inhaler Inhale 2 puffs into the lungs every 6 (six) hours as needed for wheezing or shortness of breath. Patient not taking: Reported on 08/27/2017 09/29/16   Funches, Josalyn, MD  BD PEN NEEDLE NANO U/F 32G X 4 MM MISC USE ONE PEN NEEDLE THREE TIMES DAILY 08/25/16   Jegede, Olugbemiga E, MD  Blood Glucose Monitoring Suppl (  ACCU-CHEK AVIVA PLUS) w/Device KIT 1 Device by Does not apply route 3 (three) times daily after meals. E11.9 02/05/17   Funches, Josalyn, MD  Blood Pressure KIT 1 each by Does not apply route 2 (two) times daily. 09/09/16   Funches, Josalyn, MD  glucose blood (ACCU-CHEK AVIVA PLUS) test strip 1 each by Other route 3 (three) times daily. E11.9 02/05/17   Funches, Josalyn, MD  predniSONE (DELTASONE) 20 MG tablet Take 2 tablets (40 mg total) by mouth daily for three days. Then continue your usual 5mg dose daily. Patient not taking: Reported on 08/27/2017 08/24/17   Ward, Jaime Pilcher, [name]    Family History Family History  Problem Relation Age of Onset  . Hypertension Mother   . Diabetes Mother   . COPD Mother   . Lung cancer Mother        Smoker   . Hypertension Father   . Diabetes Father   . Heart Problems Father   . COPD Father   . Colon cancer Neg Hx   . Stomach cancer Neg Hx     Social History Social History   Tobacco Use  . Smoking status:  Former Smoker    Packs/day: 0.50    Years: 10.00    Pack years: 5.00    Types: Cigarettes    Last attempt to quit: 04/11/2008    Years since quitting: 9.3  . Smokeless tobacco: Never Used  Substance Use Topics  . Alcohol use: No    Alcohol/week: 0.0 oz  . Drug use: No     Allergies   Patient has no known allergies.   Review of Systems Review of Systems  All other systems reviewed and are negative.    Physical Exam Updated Vital Signs BP 137/74   Pulse 68   Temp 98.5 F (36.9 C) (Oral)   Resp 12   Ht 5' 6" (1.676 m)   Wt 94.8 kg (209 lb)   SpO2 92%   BMI 33.73 kg/m   Physical Exam  Constitutional: He appears well-developed and well-nourished. No distress.  HENT:  Head: Normocephalic and atraumatic.  Mouth/Throat: Oropharynx is clear and moist. No oropharyngeal exudate.  Eyes: Conjunctivae and EOM are normal. Pupils are equal, round, and reactive to light. Right eye exhibits no discharge. Left eye exhibits no discharge. No scleral icterus.  Neck: Normal range of motion. Neck supple. No JVD present. No thyromegaly present.  Cardiovascular: Normal rate, regular rhythm, normal heart sounds and intact distal pulses. Exam reveals no gallop and no friction rub.  No murmur heard. Pulmonary/Chest: Effort normal and breath sounds normal. No respiratory distress. He has no wheezes. He has no rales.  Abdominal: Soft. Bowel sounds are normal. He exhibits no distension and no mass. There is no tenderness.  Musculoskeletal: Normal range of motion. He exhibits no edema or tenderness.  Lymphadenopathy:    He has no cervical adenopathy.  Neurological: He is alert. Coordination normal.  The patient has normal speech and is able to follow commands without any difficulty however he does have tremor, myoclonic jerking  Skin: Skin is warm and dry. No rash noted. No erythema.  Psychiatric: He has a normal mood and affect. His behavior is normal.  Nursing note and vitals  reviewed.    ED Treatments / Results  Labs (all labs ordered are listed, but only abnormal results are displayed) Labs Reviewed  BASIC METABOLIC PANEL - Abnormal; Notable for the following components:      Result Value     Chloride 96 (*)    CO2 20 (*)    Glucose, Bld 262 (*)    BUN 43 (*)    Creatinine, Ser 3.40 (*)    Calcium 8.3 (*)    GFR calc non Af Amer 18 (*)    GFR calc Af Amer 21 (*)    Anion gap 22 (*)    All other components within normal limits  CBC - Abnormal; Notable for the following components:   WBC 13.3 (*)    Hemoglobin 12.9 (*)    All other components within normal limits  HEPATIC FUNCTION PANEL - Abnormal; Notable for the following components:   Albumin 3.4 (*)    Alkaline Phosphatase 36 (*)    Bilirubin, Direct <0.1 (*)    All other components within normal limits  URINALYSIS, ROUTINE W REFLEX MICROSCOPIC - Abnormal; Notable for the following components:   Leukocytes, UA SMALL (*)    Bacteria, UA RARE (*)    Squamous Epithelial / LPF 0-5 (*)    All other components within normal limits  BASIC METABOLIC PANEL - Abnormal; Notable for the following components:   Potassium 3.4 (*)    Chloride 99 (*)    Glucose, Bld 279 (*)    BUN 50 (*)    Creatinine, Ser 3.49 (*)    Calcium 7.7 (*)    GFR calc non Af Amer 17 (*)    GFR calc Af Amer 20 (*)    Anion gap 18 (*)    All other components within normal limits  CBC - Abnormal; Notable for the following components:   WBC 12.1 (*)    RBC 3.77 (*)    Hemoglobin 11.3 (*)    HCT 35.6 (*)    All other components within normal limits  CBC - Abnormal; Notable for the following components:   WBC 12.0 (*)    RBC 3.77 (*)    Hemoglobin 11.2 (*)    HCT 35.7 (*)    All other components within normal limits  CREATININE, SERUM - Abnormal; Notable for the following components:   Creatinine, Ser 3.73 (*)    GFR calc non Af Amer 16 (*)    GFR calc Af Amer 19 (*)    All other components within normal limits  CBG  MONITORING, ED - Abnormal; Notable for the following components:   Glucose-Capillary 272 (*)    All other components within normal limits  CBG MONITORING, ED - Abnormal; Notable for the following components:   Glucose-Capillary 121 (*)    All other components within normal limits  CBG MONITORING, ED - Abnormal; Notable for the following components:   Glucose-Capillary 154 (*)    All other components within normal limits  URINE CULTURE  AMMONIA  SALICYLATE LEVEL  HIV ANTIBODY (ROUTINE TESTING)  SODIUM, URINE, RANDOM  CREATININE, URINE, RANDOM     Radiology Us Renal  Result Date: 08/28/2017 CLINICAL DATA:  61-year-old male with acute renal failure. EXAM: RENAL / URINARY TRACT ULTRASOUND COMPLETE COMPARISON:  Renal ultrasound dated 09/03/2016 FINDINGS: Evaluation is limited due to patient's body habitus. Right Kidney: Length: 13.5 cm. Mildly echogenic. There is mild parenchymal atrophy and cortical thinning. There is a 3.8 x 2.6 x 3.0 cm upper pole cyst. There is no hydronephrosis or shadowing stone. Left Kidney: Length: 12.7 cm. Mildly echogenic. Mild parenchymal atrophy and cortical thinning. There is a 1.9 x 1.5 x 1.7 cm mid to lower pole probable cyst. There is no hydronephrosis or shadowing stone. Bladder:   Mildly trabeculated appearance of the bladder wall likely related to chronic bladder outlet obstruction. The bladder is otherwise unremarkable. Bilateral ureteral jets noted. IMPRESSION: 1. No hydronephrosis or shadowing stone. 2. Mildly echogenic kidneys with cortical thinning similar to prior ultrasound. 3. Mildly trabeculated bladder wall likely related to chronic bladder outlet obstruction. Electronically Signed   By: Arash  Radparvar M.D.   On: 08/28/2017 06:55    Procedures Procedures (including critical care time)  Medications Ordered in ED Medications  allopurinol (ZYLOPRIM) tablet 100 mg (100 mg Oral Given 08/28/17 1215)  aspirin EC tablet 81 mg (81 mg Oral Given 08/28/17  0955)  atorvastatin (LIPITOR) tablet 40 mg (40 mg Oral Given 08/28/17 1216)  insulin detemir (LEVEMIR) injection 30 Units (30 Units Subcutaneous Given 08/28/17 0021)  predniSONE (DELTASONE) tablet 5 mg (5 mg Oral Given 08/28/17 1216)  zolpidem (AMBIEN) tablet 5 mg (not administered)  acetaminophen (TYLENOL) tablet 650 mg (not administered)    Or  acetaminophen (TYLENOL) suppository 650 mg (not administered)  ondansetron (ZOFRAN) tablet 4 mg (not administered)    Or  ondansetron (ZOFRAN) injection 4 mg (not administered)  insulin aspart (novoLOG) injection 0-9 Units (2 Units Subcutaneous Given 08/28/17 1216)  heparin injection 5,000 Units (5,000 Units Subcutaneous Given 08/28/17 1352)  0.9 %  sodium chloride infusion ( Intravenous Stopped 08/28/17 0814)  HYDROcodone-acetaminophen (NORCO/VICODIN) 5-325 MG per tablet 1 tablet (1 tablet Oral Given 08/28/17 0955)  hydrALAZINE (APRESOLINE) injection 10 mg (not administered)  cefTRIAXone (ROCEPHIN) 1 g in dextrose 5 % 50 mL IVPB (0 g Intravenous Stopped 08/28/17 0814)  sodium chloride 0.9 % bolus 1,000 mL (0 mLs Intravenous Stopped 08/27/17 2347)     Initial Impression / Assessment and Plan / ED Course  I have reviewed the triage vital signs and the nursing notes.  Pertinent labs & imaging results that were available during my care of the patient were reviewed by me and considered in my medical decision making (see chart for details).     The patient has myoclonic jerking, he has a leukocytosis and acute kidney injury, he does have an anion gap though it is unclear where this is coming from.  He will need further evaluation with some testing, he does have some hyperglycemia and will be given IV fluids both for his hyperglycemia and for his acute kidney injury.  He will need to be admitted to the hospital.  Acute kidney injury, discussed with hospitalist who will admit.  Final Clinical Impressions(s) / ED Diagnoses   Final diagnoses:  Acute renal  failure (ARF) (HCC)    ED Discharge Orders    None       , , MD 08/28/17 1453  

## 2017-08-27 NOTE — Progress Notes (Signed)
Patient ID: Marco [name], male    DOB: Jul 15, 1956  MRN: [id number]  CC: No chief complaint on file.   Subjective: Marco [name] is a 62 y.o. male who presents for UC visit His concerns today include:  62 year old male with history of HTN, DM 2 with neuropathy, CKD stage III, obesity, nonobstructive CAD, chronic combined CHF, OSA on CPAP, followed by rheumatology in [facility] for psoriasis/gout/nonspec arthritis. On narcotics through [facility] for chronic pain in chest/back/shoulders/neck  C/o uncontrollable  twitching in his upper body including arms x few days Started in hands 3 days ago; unable to hold his phone or remote control.  Got progressive worse in upper body and head. -no new med except Allopurinol which was started a month ago.  Prednisone increased to 40 mg 3 days ago after ER visit for knee pain; he has also was tolf to double up on his Hydrocodone for several days  -unable to sleep last evening for the shaking No loss of bowel or bladder function Does not feel well Patient Active Problem List   Diagnosis Date Noted  . Dysphagia 01/09/2017  . Hyperlipidemia 12/23/2016  . Chronic combined systolic and diastolic CHF (congestive heart failure) ([place]) 09/09/2016  . Diabetes mellitus with complication ([place])   . Diabetic retinopathy ([place]) 07/22/2016  . Diabetic polyneuropathy associated with type 2 diabetes mellitus ([place]) 06/10/2016  . Myalgia and myositis 03/31/2016  . Dyspnea on exertion 02/09/2016  . Abdominal bloating 02/05/2016  . Chronic gouty arthritis 11/29/2015  . Ache in joint 11/27/2015  . LBP (low back pain) 11/27/2015  . Arthralgia of multiple joints 11/27/2015  . Psoriasis 11/27/2015  . Breast pain in male 11/14/2015  . Sinusitis, chronic 10/16/2015  . Insomnia 10/16/2015  . Adult BMI 30+ 10/04/2015  . New onset of headaches after age 2 09/20/2015  . OSA (obstructive sleep apnea) 07/12/2015  . Low serum testosterone level 05/18/2015  . Depression  05/16/2015  . Chronic pain   . CAD (coronary artery disease) 12/24/2014  . Persistent atrial fibrillation ([place]) 12/24/2014  . Essential hypertension 12/24/2014  . CKD (chronic kidney disease) stage 3, GFR 30-59 ml/min (HCC) 12/24/2014  . PUD (peptic ulcer disease) 12/24/2014  . DM2 (diabetes mellitus, type 2) ([place]) 12/24/2014     Current Outpatient Medications on File Prior to Visit  Medication Sig Dispense Refill  . ACCU-CHEK AVIVA PLUS test strip USE ONE STRIP TO CHECK BLOOD GLUCOSE 3 TIMES DAILY 100 each 12  . ACCU-CHEK FASTCLIX LANCETS MISC Use a directed to check blood sugar 3 times daily. 100 each 11  . albuterol (PROVENTIL HFA;VENTOLIN HFA) 108 (90 Base) MCG/ACT inhaler Inhale 2 puffs into the lungs every 6 (six) hours as needed for wheezing or shortness of breath. 1 Inhaler 2  . allopurinol (ZYLOPRIM) 100 MG tablet Take 100 mg by mouth 2 (two) times daily.    [name] aspirin (EQ ASPIRIN ADULT LOW DOSE) 81 MG EC tablet Take 1 tablet (81 mg total) by mouth daily. Swallow whole. 90 tablet 3  . atorvastatin (LIPITOR) 40 MG tablet Take 1 tablet (40 mg total) by mouth daily. 90 tablet 3  . BD PEN NEEDLE NANO U/F 32G X 4 MM MISC USE ONE PEN NEEDLE THREE TIMES DAILY 100 each 11  . Blood Glucose Monitoring Suppl (ACCU-CHEK AVIVA PLUS) w/Device KIT 1 Device by Does not apply route 3 (three) times daily after meals. E11.9 1 kit 0  . Blood Pressure KIT 1 each by Does not apply route  2 (two) times daily. 1 each 0  . fluorometholone (FML) 0.1 % ophthalmic suspension Place 1 drop into both eyes 3 (three) times daily.    . furosemide (LASIX) 40 MG tablet Take 1 tablet by mouth every morning (Patient taking differently: Take 40 mg by mouth every morning. Take 1 tablet by mouth every morning) 90 tablet 3  . glucose blood (ACCU-CHEK AVIVA PLUS) test strip 1 each by Other route 3 (three) times daily. E11.9 100 each 12  . insulin aspart (NOVOLOG FLEXPEN) 100 UNIT/ML FlexPen Inject 14-24 Units into the skin 3  (three) times daily with meals. 15 mL 5  . Insulin Detemir (LEVEMIR FLEXTOUCH) 100 UNIT/ML Pen Inject 50 Units into the skin daily at 10 pm. 45 mL 3  . losartan (COZAAR) 50 MG tablet Take 50 mg by mouth daily.    . metFORMIN (GLUCOPHAGE-XR) 500 MG 24 hr tablet Take 2 tablets (1,000 mg total) by mouth 2 (two) times daily. 360 tablet 3  . predniSONE (DELTASONE) 20 MG tablet Take 2 tablets (40 mg total) by mouth daily for three days. Then continue your usual '5mg'$  dose daily. 6 tablet 0  . predniSONE (DELTASONE) 5 MG tablet Take 5 mg by mouth daily.    . pregabalin (LYRICA) 100 MG capsule Take 1 capsule (100 mg total) by mouth 3 (three) times daily. 90 capsule 5  . ranitidine (ZANTAC) 150 MG tablet Take 1 tablet (150 mg total) by mouth at bedtime. (Patient taking differently: Take 150 mg by mouth daily before breakfast. ) 90 tablet 3  . Secukinumab (COSENTYX) 150 MG/ML SOSY Inject 150 mg into the skin every 28 (twenty-eight) days.     [name] zolpidem (AMBIEN CR) 6.25 MG CR tablet Take 1 tablet (6.25 mg total) by mouth at bedtime as needed for sleep. 30 tablet 1   No current facility-administered medications on file prior to visit.     No Known Allergies  Social History   Socioeconomic History  . Marital status: Married    Spouse name: Amethyst  . Number of children: 8  . Years of education: 8  . Highest education level: Not on file  Social Needs  . Financial resource strain: Not on file  . Food insecurity - worry: Not on file  . Food insecurity - inability: Not on file  . Transportation needs - medical: Not on file  . Transportation needs - non-medical: Not on file  Occupational History    Comment: disabled  Tobacco Use  . Smoking status: Former Smoker    Packs/day: 0.50    Years: 10.00    Pack years: 5.00    Types: Cigarettes    Last attempt to quit: 04/11/2008    Years since quitting: 9.3  . Smokeless tobacco: Never Used  Substance and Sexual Activity  . Alcohol use: No     Alcohol/week: 0.0 oz  . Drug use: No  . Sexual activity: Yes    Partners: Female    Birth control/protection: None  Other Topics Concern  . Not on file  Social History Narrative   Lives with wife. Does not work.  On disability.    Caffeine- coffee, 1/2 cup daily    Family History  Problem Relation Age of Onset  . Hypertension Mother   . Diabetes Mother   . COPD Mother   . Lung cancer Mother        Smoker   . Hypertension Father   . Diabetes Father   . Heart Problems Father   .  COPD Father   . Colon cancer Neg Hx   . Stomach cancer Neg Hx     Past Surgical History:  Procedure Laterality Date  . CARDIAC CATHETERIZATION  05/2014   ablation for atrial fibrillation  . CARDIAC CATHETERIZATION N/A 11/16/2015   Procedure: Left Heart Cath and Coronary Angiography;  Surgeon: Leonie Man, MD;  Location: [facility];  Service: Cardiovascular;  Laterality: N/A;  . COLON RESECTION  09/2013   due to large, abnormal polpy. non cancerous per patient.   . COLON SURGERY  09/2014   colon resection     ROS: Review of Systems As above PHYSICAL EXAM: BP (!) 142/79 (BP Location: Right Arm, Patient Position: Sitting, Cuff Size: Large)   Pulse 73   Resp 16   SpO2 97%   Physical Exam  General appearance - alert, well appearing, and in no distress Mental status - alert, oriented to person, place, and time Neurological - cranial nerves II through XII intact. Frequent intermittent myoclonic jerks of head, hands and trunk  Results for orders placed or performed in visit on 08/27/17  POCT glucose (manual entry)  Result Value Ref Range   POC Glucose 192 (A) 70 - 99 mg/dl  HgB A1c  Result Value Ref Range   Hemoglobin A1C 7.9     ASSESSMENT AND PLAN: 1. Myoclonic jerking -advise pt to be seen in ER fir blood testing and neurology eval.  2. Diabetes mellitus with complication (HCC) - POCT glucose (manual entry) - HgB A1c  Patient was given the opportunity to ask questions.   Patient verbalized understanding of the plan and was able to repeat key elements of the plan.   Orders Placed This Encounter  Procedures  . POCT glucose (manual entry)  . HgB A1c     Requested Prescriptions    No prescriptions requested or ordered in this encounter    No Follow-up on file.  Karle Plumber, MD, FACP

## 2017-08-28 ENCOUNTER — Other Ambulatory Visit: Payer: Self-pay

## 2017-08-28 ENCOUNTER — Observation Stay (HOSPITAL_COMMUNITY): Payer: Medicaid Other

## 2017-08-28 DIAGNOSIS — F411 Generalized anxiety disorder: Secondary | ICD-10-CM | POA: Diagnosis present

## 2017-08-28 DIAGNOSIS — I5042 Chronic combined systolic (congestive) and diastolic (congestive) heart failure: Secondary | ICD-10-CM | POA: Diagnosis present

## 2017-08-28 DIAGNOSIS — Z794 Long term (current) use of insulin: Secondary | ICD-10-CM

## 2017-08-28 DIAGNOSIS — N183 Chronic kidney disease, stage 3 (moderate): Secondary | ICD-10-CM | POA: Diagnosis present

## 2017-08-28 DIAGNOSIS — N182 Chronic kidney disease, stage 2 (mild): Secondary | ICD-10-CM | POA: Diagnosis not present

## 2017-08-28 DIAGNOSIS — M1A00X0 Idiopathic chronic gout, unspecified site, without tophus (tophi): Secondary | ICD-10-CM | POA: Diagnosis not present

## 2017-08-28 DIAGNOSIS — E119 Type 2 diabetes mellitus without complications: Secondary | ICD-10-CM | POA: Diagnosis not present

## 2017-08-28 DIAGNOSIS — M1A9XX0 Chronic gout, unspecified, without tophus (tophi): Secondary | ICD-10-CM | POA: Diagnosis present

## 2017-08-28 DIAGNOSIS — Z7982 Long term (current) use of aspirin: Secondary | ICD-10-CM | POA: Diagnosis not present

## 2017-08-28 DIAGNOSIS — I130 Hypertensive heart and chronic kidney disease with heart failure and stage 1 through stage 4 chronic kidney disease, or unspecified chronic kidney disease: Secondary | ICD-10-CM | POA: Diagnosis present

## 2017-08-28 DIAGNOSIS — M25561 Pain in right knee: Secondary | ICD-10-CM | POA: Diagnosis present

## 2017-08-28 DIAGNOSIS — I2510 Atherosclerotic heart disease of native coronary artery without angina pectoris: Secondary | ICD-10-CM | POA: Diagnosis present

## 2017-08-28 DIAGNOSIS — Z86010 Personal history of colonic polyps: Secondary | ICD-10-CM | POA: Diagnosis not present

## 2017-08-28 DIAGNOSIS — N179 Acute kidney failure, unspecified: Secondary | ICD-10-CM | POA: Diagnosis present

## 2017-08-28 DIAGNOSIS — Z79899 Other long term (current) drug therapy: Secondary | ICD-10-CM | POA: Diagnosis not present

## 2017-08-28 DIAGNOSIS — K219 Gastro-esophageal reflux disease without esophagitis: Secondary | ICD-10-CM | POA: Diagnosis present

## 2017-08-28 DIAGNOSIS — G253 Myoclonus: Secondary | ICD-10-CM | POA: Diagnosis not present

## 2017-08-28 DIAGNOSIS — N390 Urinary tract infection, site not specified: Secondary | ICD-10-CM | POA: Diagnosis present

## 2017-08-28 DIAGNOSIS — E876 Hypokalemia: Secondary | ICD-10-CM | POA: Diagnosis present

## 2017-08-28 DIAGNOSIS — M069 Rheumatoid arthritis, unspecified: Secondary | ICD-10-CM | POA: Diagnosis present

## 2017-08-28 DIAGNOSIS — I10 Essential (primary) hypertension: Secondary | ICD-10-CM | POA: Diagnosis not present

## 2017-08-28 DIAGNOSIS — R251 Tremor, unspecified: Secondary | ICD-10-CM | POA: Diagnosis present

## 2017-08-28 DIAGNOSIS — Z833 Family history of diabetes mellitus: Secondary | ICD-10-CM | POA: Diagnosis not present

## 2017-08-28 DIAGNOSIS — I481 Persistent atrial fibrillation: Secondary | ICD-10-CM | POA: Diagnosis not present

## 2017-08-28 DIAGNOSIS — M109 Gout, unspecified: Secondary | ICD-10-CM | POA: Diagnosis not present

## 2017-08-28 DIAGNOSIS — G8929 Other chronic pain: Secondary | ICD-10-CM | POA: Diagnosis present

## 2017-08-28 DIAGNOSIS — E1122 Type 2 diabetes mellitus with diabetic chronic kidney disease: Secondary | ICD-10-CM | POA: Diagnosis present

## 2017-08-28 DIAGNOSIS — G4733 Obstructive sleep apnea (adult) (pediatric): Secondary | ICD-10-CM | POA: Diagnosis present

## 2017-08-28 DIAGNOSIS — E785 Hyperlipidemia, unspecified: Secondary | ICD-10-CM | POA: Diagnosis present

## 2017-08-28 DIAGNOSIS — I480 Paroxysmal atrial fibrillation: Secondary | ICD-10-CM | POA: Diagnosis present

## 2017-08-28 DIAGNOSIS — R079 Chest pain, unspecified: Secondary | ICD-10-CM | POA: Diagnosis present

## 2017-08-28 LAB — CREATININE, SERUM
Creatinine, Ser: 3.73 mg/dL — ABNORMAL HIGH (ref 0.61–1.24)
GFR calc Af Amer: 19 mL/min — ABNORMAL LOW (ref >60)
GFR, EST NON AFRICAN AMERICAN: 16 mL/min — AB (ref >60)

## 2017-08-28 LAB — CBC
HEMATOCRIT: 35.6 % — AB (ref 39.0–52.0)
HEMATOCRIT: 35.7 % — AB (ref 39.0–52.0)
HEMOGLOBIN: 11.2 g/dL — AB (ref 13.0–17.0)
HEMOGLOBIN: 11.3 g/dL — AB (ref 13.0–17.0)
MCH: 29.7 pg (ref 26.0–34.0)
MCH: 30 pg (ref 26.0–34.0)
MCHC: 31.7 g/dL (ref 30.0–36.0)
MCHC: 31.4 g/dL (ref 30.0–36.0)
MCV: 94.7 fL (ref 78.0–100.0)
MCV: 94.4 fL (ref 78.0–100.0)
PLATELETS: 277 10*3/uL (ref 150–400)
Platelets: 283 10*3/uL (ref 150–400)
RBC: 3.77 MIL/uL — ABNORMAL LOW (ref 4.22–5.81)
RBC: 3.77 MIL/uL — AB (ref 4.22–5.81)
RDW: 14.4 % (ref 11.5–15.5)
RDW: 14.3 % (ref 11.5–15.5)
WBC: 12 10*3/uL — ABNORMAL HIGH (ref 4.0–10.5)
WBC: 12.1 10*3/uL — AB (ref 4.0–10.5)

## 2017-08-28 LAB — CBG MONITORING, ED
GLUCOSE-CAPILLARY: 272 mg/dL — AB (ref 65–99)
Glucose-Capillary: 121 mg/dL — ABNORMAL HIGH (ref 65–99)
Glucose-Capillary: 154 mg/dL — ABNORMAL HIGH (ref 65–99)

## 2017-08-28 LAB — HIV ANTIBODY (ROUTINE TESTING W REFLEX): HIV Screen 4th Generation wRfx: NONREACTIVE

## 2017-08-28 LAB — SODIUM, URINE, RANDOM: Sodium, Ur: <10 mmol/L

## 2017-08-28 LAB — GLUCOSE, CAPILLARY
GLUCOSE-CAPILLARY: 283 mg/dL — AB (ref 65–99)
GLUCOSE-CAPILLARY: 296 mg/dL — AB (ref 65–99)

## 2017-08-28 LAB — BASIC METABOLIC PANEL
ANION GAP: 18 — AB (ref 5–15)
BUN: 50 mg/dL — ABNORMAL HIGH (ref 6–20)
CHLORIDE: 99 mmol/L — AB (ref 101–111)
CO2: 23 mmol/L (ref 22–32)
CREATININE: 3.49 mg/dL — AB (ref 0.61–1.24)
Calcium: 7.7 mg/dL — ABNORMAL LOW (ref 8.9–10.3)
GFR calc non Af Amer: 17 mL/min — ABNORMAL LOW (ref >60)
GFR, EST AFRICAN AMERICAN: 20 mL/min — AB (ref >60)
Glucose, Bld: 279 mg/dL — ABNORMAL HIGH (ref 65–99)
POTASSIUM: 3.4 mmol/L — AB (ref 3.5–5.1)
SODIUM: 140 mmol/L (ref 135–145)

## 2017-08-28 LAB — CREATININE, URINE, RANDOM: CREATININE, URINE: 189.79 mg/dL

## 2017-08-28 MED ORDER — DEXTROSE 5 % IV SOLN
1.0000 g | INTRAVENOUS | Status: DC
  Administered 2017-08-28 – 2017-08-31 (×4): 1 g via INTRAVENOUS
  Filled 2017-08-28 (×4): qty 10

## 2017-08-28 MED ORDER — FAMOTIDINE 20 MG PO TABS
20.0000 mg | ORAL_TABLET | Freq: Once | ORAL | Status: AC
  Administered 2017-08-28: 20 mg via ORAL
  Filled 2017-08-28: qty 1

## 2017-08-28 MED ORDER — HYDRALAZINE HCL 20 MG/ML IJ SOLN
10.0000 mg | INTRAMUSCULAR | Status: DC | PRN
  Administered 2017-08-31: 10 mg via INTRAVENOUS
  Filled 2017-08-28: qty 1

## 2017-08-28 MED ORDER — HYDROCODONE-ACETAMINOPHEN 5-325 MG PO TABS
1.0000 | ORAL_TABLET | Freq: Four times a day (QID) | ORAL | Status: DC | PRN
  Administered 2017-08-28 – 2017-08-31 (×10): 1 via ORAL
  Filled 2017-08-28 (×10): qty 1

## 2017-08-28 MED ORDER — INSULIN DETEMIR 100 UNIT/ML ~~LOC~~ SOLN
15.0000 [IU] | Freq: Every day | SUBCUTANEOUS | Status: DC
  Administered 2017-08-28 – 2017-08-30 (×3): 15 [IU] via SUBCUTANEOUS
  Filled 2017-08-28 (×4): qty 0.15

## 2017-08-28 NOTE — Progress Notes (Signed)
Pt. States he is undecided about wearing cpap and that he will notify if he changes his mind.

## 2017-08-28 NOTE — ED Notes (Signed)
Pt requesting juice to drink. RN Anderson Malta ok pt to have sprite zero due to diet order

## 2017-08-28 NOTE — Progress Notes (Signed)
PROGRESS NOTE    Marco [name]  ZOX:[id number] DOB: 1956/04/24 DOA: 08/27/2017 PCP: [name], MD    Brief Narrative:  62-year-old male presented with jerking movements of his upper extremities. He does have significant past med history of diabetes 2, chronic kidney disease stage III, sleep apnea, heart failure, atrial fibrillation. For last 3 days he has noticed increased jerking movements upper extremities, recent gout attack on his right knee, received prednisone. On his initial physical examination blood pressure 149/86, heart rate 76, respiratory 18, temperature 98.3, oxygenation 98%. Moist mucous membranes, lungs clear to auscultation bilaterally, heart S1-S2 present rhythmic, abdomen soft nontender, no lower extremity edema. Patient is creatinine was noted to be elevated.  Patient was admitted to the hospital working diagnosis of acute kidney injury.   Assessment & Plan:   Principal Problem:   ARF (acute renal failure) (HCC) Active Problems:   Persistent atrial fibrillation (HCC)   Essential hypertension   DM2 (diabetes mellitus, type 2) (HCC)   OSA (obstructive sleep apnea)   Chronic gouty arthritis   Renal failure (ARF), acute on chronic (HCC)   Myoclonic jerking   1. Acute kidney injury chronic kidney disease. Renal function with stable cr, will continue hydration with saline, will follow renal panel in am, will follow on renal US. Monitor urine output. Serum K at 3,4 with bicarbonate 23.  2. Type 2 diabetes mellitus/ Will continue glucose cover and monitoring, capillary glucose 121, 154, 283. Tolerating po well, no nausea or vomiting.   3. Hypertension. Will continue blood pressure monitoring  4. Atrial fibrillation. Rate control, will continue  5. Gout. No joint pain  6. Urinary tract infection. Follow on urine culture, will continue with cephalosporin therapy for now.    DVT prophylaxis: heparin  Code Status:  full Family Communication: no family at the  bedside Disposition Plan: home   Consultants:     Procedures:     Antimicrobials:       Subjective: Patient feeling better, jerking movements have improved, patient has been out of the bed, no nausea or vomiting, no diarrhea.   Objective: Vitals:   08/28/17 1000 08/28/17 1200 08/28/17 1400 08/28/17 1652  BP: 140/82 137/74 124/79 140/77  Pulse: 77 68 77 70  Resp:  12 17 18   Temp:    98.7 F (37.1 C)  TempSrc:    Oral  SpO2: 96% 92% 95% 95%  Weight:      Height:        Intake/Output Summary (Last 24 hours) at 08/28/2017 1811 Last data filed at 08/27/2017 2347 Gross per 24 hour  Intake 1000 ml  Output -  Net 1000 ml   Filed Weights   08/27/17 2348  Weight: 94.8 kg (209 lb)    Examination:   General: Not in pain or dyspnea, deconditioned Neurology: Awake and alert, non focal  E ENT: mild pallor, no icterus, oral mucosa moist Cardiovascular: No JVD. S1-S2 present, rhythmic, no gallops, rubs, or murmurs. No lower extremity edema. Pulmonary: decreased breath sounds bilaterally at bases, adequate air movement, no wheezing, rhonchi or rales. Gastrointestinal. Abdomen protuberant, distended and tender to deep palpation, no organomegaly, no rebound or guarding Skin. No rashes Musculoskeletal: no joint deformities     Data Reviewed: I have personally reviewed following labs and imaging studies  CBC: Recent Labs  Lab 08/24/17 1819 08/27/17 1600 08/28/17 0035 08/28/17 0238  WBC 9.9 13.3* 12.0* 12.1*  NEUTROABS 7.6  --   --   --   HGB 14.7  12.9* 11.2* 11.3*  HCT 46.5 39.8 35.7* 35.6*  MCV 95.7 94.3 94.7 94.4  PLT 274 277 283 572   Basic Metabolic Panel: Recent Labs  Lab 08/24/17 1819 08/27/17 1600 08/28/17 0035 08/28/17 0238  NA 138 138  --  140  K 3.1* 3.5  --  3.4*  CL 93* 96*  --  99*  CO2 25 20*  --  23  GLUCOSE 234* 262*  --  279*  BUN 13 43*  --  50*  CREATININE 1.86* 3.40* 3.73* 3.49*  CALCIUM 8.0* 8.3*  --  7.7*   GFR: Estimated  Creatinine Clearance: 23.7 mL/min (A) (by C-G formula based on SCr of 3.49 mg/dL (H)). Liver Function Tests: Recent Labs  Lab 08/24/17 1819 08/27/17 2140  AST 31 36  ALT 17 20  ALKPHOS 38 36*  BILITOT 0.8 0.7  PROT 8.1 7.6  ALBUMIN 3.8 3.4*   No results for input(s): LIPASE, AMYLASE in the last 168 hours. Recent Labs  Lab 08/27/17 2140  AMMONIA 25   Coagulation Profile: No results for input(s): INR, PROTIME in the last 168 hours. Cardiac Enzymes: No results for input(s): CKTOTAL, CKMB, CKMBINDEX, TROPONINI in the last 168 hours. BNP (last 3 results) Recent Labs    09/25/16 1122 12/23/16 1333 01/01/17 1038  PROBNP 208 286* 180   HbA1C: Recent Labs    08/27/17 1408  HGBA1C 7.9   CBG: Recent Labs  Lab 08/28/17 0021 08/28/17 0737 08/28/17 1153 08/28/17 1705  GLUCAP 272* 121* 154* 283*   Lipid Profile: No results for input(s): CHOL, HDL, LDLCALC, TRIG, CHOLHDL, LDLDIRECT in the last 72 hours. Thyroid Function Tests: No results for input(s): TSH, T4TOTAL, FREET4, T3FREE, THYROIDAB in the last 72 hours. Anemia Panel: No results for input(s): VITAMINB12, FOLATE, FERRITIN, TIBC, IRON, RETICCTPCT in the last 72 hours.    Radiology Studies: I have reviewed all of the imaging during this hospital visit personally     Scheduled Meds: . allopurinol  100 mg Oral BID  . aspirin EC  81 mg Oral Daily  . atorvastatin  40 mg Oral Daily  . heparin  5,000 Units Subcutaneous Q8H  . insulin aspart  0-9 Units Subcutaneous TID WC  . insulin detemir  30 Units Subcutaneous Q2200  . predniSONE  5 mg Oral Q breakfast   Continuous Infusions: . sodium chloride Stopped (08/28/17 0814)  . cefTRIAXone (ROCEPHIN)  IV Stopped (08/28/17 6203)     LOS: 0 days        Kieran Nachtigal Gerome Apley, MD [facility] Pager [phone]

## 2017-08-28 NOTE — ED Notes (Signed)
Pt lunch arrived and pt reports he does not eat fish. New luch ordered for pt and I apologized to pt. He is understanding.

## 2017-08-28 NOTE — ED Notes (Signed)
Paged provider requesting home pain medication, provider will write medications.

## 2017-08-28 NOTE — ED Notes (Signed)
Heart healthy/carb modified fluid restrictions diet ordered

## 2017-08-28 NOTE — Progress Notes (Signed)
Pt refusing CPAP. He states he does have one at home but does not wish to wear while he is here. I told him to call if he changes his mind.

## 2017-08-28 NOTE — Progress Notes (Signed)
PROGRESS NOTE     I saw and evaluated the patient, I personally obtain the key portions of the history and physical exam, I reviewed the physician assistant student documentation and agree with the physician assistant student medical decision-making. Further assessment and plan are as follows:  Please see attending's daily progress note.  Chania Kochanski M.D.  Zimri Brennen  [id number] DOB: 05/22/1956 DOA: 08/27/2017 PCP: [name], MD   Brief Narrative:  62 y/o male presents to ER with jerking motions of the upper extremities, has history significant for diabetes mellitus type 2, chronic kidney disease stage III, chronic pain, sleep apnea, chronic combined CHF, atrial fibrillation.  Patient reports increasing jerking motions of upper extremities for past 3 days prior to ER admission, and was on increased dose of prednisone for 3 days due to gouty attack, which has improved.  Initial physical examination BP 149/86. pulase 76, resp 18, temp 98.3, SpO2 98% on room air, sodium 138, potassium 3.1, chloride 93, bicarb 25, glucose 234, BUN 13, creatine 1.86, calcium 8.  Jerking movements of the upper extremities found to be consistent with myoclonic jerks in ER, and was given 1 L fluid bolus due to decreased renal function and continued with 75 ml/H IVF.  Patient is admitted to the hospital with working diagnosis of myoclonic jerks likely due to renal failure.    Assessment & Plan:   Principal Problem:   ARF (acute renal failure) (HCC) Active Problems:   Persistent atrial fibrillation (HCC)   Essential hypertension   DM2 (diabetes mellitus, type 2) (HCC)   OSA (obstructive sleep apnea)   Chronic gouty arthritis   Renal failure (ARF), acute on chronic (HCC)   Myoclonic jerking  1. Myoclonic jerking likely due to acute on chronic renal failure stage III - BUN 13, 43, 50. Creatine 1.86, 3.4, 3.73. 3.49.  Renal sonogram shows no obstruction.  Continue to hold Lasix.  Will  continue with gentle hydration.    2. Chronic gouty arthritis on lower extremities bilaterally  - Pain upon light palpation upon bilateral lower extremities during examination. Continue allopurinol for now unless creatine worsen  3. UTI - WBC 12.1, UA small leukocytes, rare bacteria. Pending urine culture, will continue ceftriaxone IV. Will order CBC to monitor.   4. Diabetes mellitus type 2 - Continue to hold metformin due to worsening renal function.  On insulin sliding scale for glucose cover and monitoring, capillary glucose 234, 262, 279.  5. Hyperlipidemia - Continue atorvastatin  DVT prophylaxis: Heparin Code Status: Full Family Communication: No family by bedside  Disposition Plan: Home   Consultants:     Procedures: Antimicrobials:  Subjective: Patient was asleep, woken up, alert, oriented, calm.  He complains of random jerking motions of the entire upper body only, without stressors or triggers.  He denies fever, dizziness, chest pain, shortness of breath, change in bowel habit.  He states he wasn't doing anything in particular when he noticed the jerks and had couple episodes of diarrhea after started.  He reports drinking fluid but not much water in particularly.  He also complains of pain in abdominal area and bilateral lower extremities upon examination and palpation.  He attributed the lower extremities pain to his chronic arthritis.  Jerking motions noticed during physical examination.   Objective: Vitals:   08/28/17 0400 08/28/17 0500 08/28/17 0738 08/28/17 0800  BP: (!) 154/81 (!) 146/81 (!) 145/80 137/90  Pulse: 80 72 64 68  Resp: 17 11 18    Temp:  TempSrc:      SpO2: 97% 94% 97% 95%  Weight:      Height:        Intake/Output Summary (Last 24 hours) at 08/28/2017 1039 Last data filed at 08/27/2017 2347 Gross per 24 hour  Intake 1000 ml  Output -  Net 1000 ml   Filed Weights   08/27/17 2348  Weight: 94.8 kg (209 lb)    Examination:  General  exam: Appears calm and comfortable  Respiratory system: Clear to auscultation. Respiratory effort normal, sometime interrupted by jerking motions. No wheezing or crackle Cardiovascular system: S1 & S2 heard, RRR.  No pedal edema. Gastrointestinal system: Abdomen is nondistended, soft and nontender. Normal bowel sounds heard. Central nervous system: Alert and oriented. No focal neurological deficits. Extremities: Random jerking motions in upper body, pain in abdomen and lower extremities upon light palpation Skin: No rashes, lesions or ulcers   Data Reviewed: I have personally reviewed following labs and imaging studies  CBC: Recent Labs  Lab 08/24/17 1819 08/27/17 1600 08/28/17 0035 08/28/17 0238  WBC 9.9 13.3* 12.0* 12.1*  NEUTROABS 7.6  --   --   --   HGB 14.7 12.9* 11.2* 11.3*  HCT 46.5 39.8 35.7* 35.6*  MCV 95.7 94.3 94.7 94.4  PLT 274 277 283 353   Basic Metabolic Panel: Recent Labs  Lab 08/24/17 1819 08/27/17 1600 08/28/17 0035 08/28/17 0238  NA 138 138  --  140  K 3.1* 3.5  --  3.4*  CL 93* 96*  --  99*  CO2 25 20*  --  23  GLUCOSE 234* 262*  --  279*  BUN 13 43*  --  50*  CREATININE 1.86* 3.40* 3.73* 3.49*  CALCIUM 8.0* 8.3*  --  7.7*   GFR: Estimated Creatinine Clearance: 23.7 mL/min (A) (by C-G formula based on SCr of 3.49 mg/dL (H)). Liver Function Tests: Recent Labs  Lab 08/24/17 1819 08/27/17 2140  AST 31 36  ALT 17 20  ALKPHOS 38 36*  BILITOT 0.8 0.7  PROT 8.1 7.6  ALBUMIN 3.8 3.4*   No results for input(s): LIPASE, AMYLASE in the last 168 hours. Recent Labs  Lab 08/27/17 2140  AMMONIA 25   Coagulation Profile: No results for input(s): INR, PROTIME in the last 168 hours. Cardiac Enzymes: No results for input(s): CKTOTAL, CKMB, CKMBINDEX, TROPONINI in the last 168 hours. BNP (last 3 results) Recent Labs    09/25/16 1122 12/23/16 1333 01/01/17 1038  PROBNP 208 286* 180   HbA1C: Recent Labs    08/27/17 1408  HGBA1C 7.9    CBG: Recent Labs  Lab 08/28/17 0021 08/28/17 0737  GLUCAP 272* 121*   Lipid Profile: No results for input(s): CHOL, HDL, LDLCALC, TRIG, CHOLHDL, LDLDIRECT in the last 72 hours. Thyroid Function Tests: No results for input(s): TSH, T4TOTAL, FREET4, T3FREE, THYROIDAB in the last 72 hours. Anemia Panel: No results for input(s): VITAMINB12, FOLATE, FERRITIN, TIBC, IRON, RETICCTPCT in the last 72 hours. Sepsis Labs: No results for input(s): PROCALCITON, LATICACIDVEN in the last 168 hours.  No results found for this or any previous visit (from the past 240 hour(s)).       Radiology Studies: US Renal  Result Date: 08/28/2017 CLINICAL DATA:  62 year old male with acute renal failure. EXAM: RENAL / URINARY TRACT ULTRASOUND COMPLETE COMPARISON:  Renal ultrasound dated 09/03/2016 FINDINGS: Evaluation is limited due to patient's body habitus. Right Kidney: Length: 13.5 cm. Mildly echogenic. There is mild parenchymal atrophy and cortical thinning. There is  a 3.8 x 2.6 x 3.0 cm upper pole cyst. There is no hydronephrosis or shadowing stone. Left Kidney: Length: 12.7 cm. Mildly echogenic. Mild parenchymal atrophy and cortical thinning. There is a 1.9 x 1.5 x 1.7 cm mid to lower pole probable cyst. There is no hydronephrosis or shadowing stone. Bladder: Mildly trabeculated appearance of the bladder wall likely related to chronic bladder outlet obstruction. The bladder is otherwise unremarkable. Bilateral ureteral jets noted. IMPRESSION: 1. No hydronephrosis or shadowing stone. 2. Mildly echogenic kidneys with cortical thinning similar to prior ultrasound. 3. Mildly trabeculated bladder wall likely related to chronic bladder outlet obstruction. Electronically Signed   By: Anner Crete M.D.   On: 08/28/2017 06:55        Scheduled Meds: . allopurinol  100 mg Oral BID  . aspirin EC  81 mg Oral Daily  . atorvastatin  40 mg Oral Daily  . heparin  5,000 Units Subcutaneous Q8H  . insulin aspart   0-9 Units Subcutaneous TID WC  . insulin detemir  30 Units Subcutaneous Q2200  . predniSONE  5 mg Oral Q breakfast   Continuous Infusions: . sodium chloride Stopped (08/28/17 0814)  . cefTRIAXone (ROCEPHIN)  IV Stopped (08/28/17 6060)     LOS: 0 days    Jo-ku Elta [place]) Mervyn Skeeters, PA-S

## 2017-08-28 NOTE — ED Notes (Signed)
Patient transported to Ultrasound.  RN asked that pt remain on monitor.

## 2017-08-28 NOTE — ED Notes (Signed)
Pt eating breakfast 

## 2017-08-28 NOTE — Progress Notes (Signed)
Pharmacy Antibiotic Note  Marco [name] is a 62 y.o. male admitted on 08/27/2017 with myoclonic jerks.  Pharmacy has been consulted for Ceftriaxone dosing for UTI. WBC mild elevation. Acute on chronic renal failure.   Plan: Ceftriaxone 1g IV q24h F/U urine culture for directed therapy  Height: 5\' 6"  (167.6 cm) Weight: 209 lb (94.8 kg) IBW/kg (Calculated) : 63.8  Temp (24hrs), Avg:98.4 F (36.9 C), Min:98.3 F (36.8 C), Max:98.5 F (36.9 C)  Recent Labs  Lab 08/24/17 1819 08/27/17 1600 08/28/17 0035 08/28/17 0238  WBC 9.9 13.3* 12.0* 12.1*  CREATININE 1.86* 3.40* 3.73* 3.49*    Estimated Creatinine Clearance: 23.7 mL/min (A) (by C-G formula based on SCr of 3.49 mg/dL (H)).    No Known Allergies    Marco [name] 08/28/2017 5:17 AM

## 2017-08-29 DIAGNOSIS — I481 Persistent atrial fibrillation: Secondary | ICD-10-CM

## 2017-08-29 DIAGNOSIS — M1A00X0 Idiopathic chronic gout, unspecified site, without tophus (tophi): Secondary | ICD-10-CM

## 2017-08-29 LAB — BASIC METABOLIC PANEL
ANION GAP: 13 (ref 5–15)
Anion gap: 12 (ref 5–15)
BUN: 35 mg/dL — ABNORMAL HIGH (ref 6–20)
BUN: 26 mg/dL — ABNORMAL HIGH (ref 6–20)
CHLORIDE: 105 mmol/L (ref 101–111)
CHLORIDE: 103 mmol/L (ref 101–111)
CO2: 25 mmol/L (ref 22–32)
CO2: 24 mmol/L (ref 22–32)
CREATININE: 1.81 mg/dL — AB (ref 0.61–1.24)
Calcium: 7.4 mg/dL — ABNORMAL LOW (ref 8.9–10.3)
Calcium: 7.4 mg/dL — ABNORMAL LOW (ref 8.9–10.3)
Creatinine, Ser: 1.85 mg/dL — ABNORMAL HIGH (ref 0.61–1.24)
GFR calc Af Amer: 45 mL/min — ABNORMAL LOW (ref >60)
GFR calc Af Amer: 43 mL/min — ABNORMAL LOW (ref >60)
GFR calc non Af Amer: 38 mL/min — ABNORMAL LOW (ref >60)
GFR, EST NON AFRICAN AMERICAN: 37 mL/min — AB (ref >60)
GLUCOSE: 167 mg/dL — AB (ref 65–99)
GLUCOSE: 222 mg/dL — AB (ref 65–99)
POTASSIUM: 3 mmol/L — AB (ref 3.5–5.1)
Potassium: 2.7 mmol/L — CL (ref 3.5–5.1)
Sodium: 142 mmol/L (ref 135–145)
Sodium: 140 mmol/L (ref 135–145)

## 2017-08-29 LAB — URINE CULTURE

## 2017-08-29 LAB — GLUCOSE, CAPILLARY
GLUCOSE-CAPILLARY: 163 mg/dL — AB (ref 65–99)
Glucose-Capillary: 138 mg/dL — ABNORMAL HIGH (ref 65–99)
Glucose-Capillary: 212 mg/dL — ABNORMAL HIGH (ref 65–99)
Glucose-Capillary: 217 mg/dL — ABNORMAL HIGH (ref 65–99)

## 2017-08-29 MED ORDER — TRAMADOL HCL 50 MG PO TABS
50.0000 mg | ORAL_TABLET | Freq: Once | ORAL | Status: AC
  Administered 2017-08-29: 50 mg via ORAL
  Filled 2017-08-29: qty 1

## 2017-08-29 MED ORDER — TRAMADOL HCL 50 MG PO TABS
50.0000 mg | ORAL_TABLET | Freq: Once | ORAL | Status: DC
  Filled 2017-08-29 (×2): qty 1

## 2017-08-29 MED ORDER — HYDROCODONE-ACETAMINOPHEN 5-325 MG PO TABS
2.0000 | ORAL_TABLET | Freq: Once | ORAL | Status: AC
  Administered 2017-08-29: 2 via ORAL
  Filled 2017-08-29: qty 2

## 2017-08-29 MED ORDER — GI COCKTAIL ~~LOC~~
30.0000 mL | Freq: Once | ORAL | Status: DC
  Filled 2017-08-29: qty 30

## 2017-08-29 MED ORDER — SODIUM CHLORIDE 0.45 % IV SOLN
INTRAVENOUS | Status: DC
  Administered 2017-08-29 – 2017-08-30 (×3): via INTRAVENOUS

## 2017-08-29 MED ORDER — POTASSIUM CHLORIDE CRYS ER 20 MEQ PO TBCR
40.0000 meq | EXTENDED_RELEASE_TABLET | Freq: Two times a day (BID) | ORAL | Status: AC
  Administered 2017-08-29 (×2): 40 meq via ORAL
  Filled 2017-08-29 (×2): qty 2

## 2017-08-29 NOTE — Progress Notes (Signed)
During initial shift assessment, patient is c/o mid sternal chest pain.  He describes it as a burning feeling.  He feels like it could be indigestion.  [facility] made aware.  Pepcid given at 2111.  Patient stated that chest pain resolved.  Will continue to monitor patient.  Earleen Reaper RN-BC, [employer]

## 2017-08-29 NOTE — Progress Notes (Signed)
PT refused CPAP.  

## 2017-08-29 NOTE — Progress Notes (Signed)
At approximately 0200, patient c/o right knee pain.  Right knee does appear to be slightly more swollen in comparison to left knee.  It is very tender to touch.  Patient had recent diagnosis of gout to right knee.  He had received Norco at 2300 and not time for next PRN dose.  [facility] made aware.  New order of Ultram given at 0230.  Heat packs also given.  Pain did improve some but patient stated it felt better with the heat packs.  Will continue to monitor patient.  Earleen Reaper RN-BC, [employer]

## 2017-08-29 NOTE — Progress Notes (Signed)
Patient given prn Norco at 0531 for right knee pain.  At approximately 0600, patient complained or significant burning while urinating.  This new.  [facility] called and made aware.  New order for Ultram received.  Patient refused because he did not want to take another narcotic.  Will continue to monitor.  He will mention to on coming MD.  Also, received critical lab value of 2.7 Potassium.  [facility] made aware.  40 meq K+ given.  Will continue to monitor patient.  Earleen Reaper RN-BC, [employer]

## 2017-08-29 NOTE — Progress Notes (Signed)
Patient c/o feeling nauseous while eating dinner. Attempted to administer ondansetron 4 mg per prn order. Patient states no that makes me sicker, I can't take that. Offered to call MD, but patient states "I will wait it out." Will continue to monitor Marco [name] B, RN

## 2017-08-29 NOTE — Progress Notes (Signed)
PROGRESS NOTE    Marco [name]  DDU:[id number] DOB: 21-Jun-1956 DOA: 08/27/2017 PCP: [name], MD    Brief Narrative:  62 year old male presented with jerking movements of his upper extremities. He does have significant past med history of diabetes 2, chronic kidney disease stage III, sleep apnea, heart failure, atrial fibrillation. For last 3 days he has noticed increased jerking movements upper extremities, recent gout attack on his right knee, received prednisone. On his initial physical examination blood pressure 149/86, heart rate 76, respiratory 18, temperature 98.3, oxygenation 98%. Moist mucous membranes, lungs clear to auscultation bilaterally, heart S1-S2 present rhythmic, abdomen soft nontender, no lower extremity edema. Sodium 138, potassium 3.5, bicarbonate 20, chloride 96, glucose 262, BUN 43, creatinine 3.40, white count 13.3, hemoglobin 12.9, hematocrit 39.8, platelets 277. Urinalysis with 6-30 white cells, specific gravity 1.018, proteins negative.   Patient was admitted to the hospital working diagnosis of acute kidney injury.   Assessment & Plan:   Principal Problem:   ARF (acute renal failure) (HCC) Active Problems:   Persistent atrial fibrillation (HCC)   Essential hypertension   DM2 (diabetes mellitus, type 2) (HCC)   OSA (obstructive sleep apnea)   Chronic gouty arthritis   Renal failure (ARF), acute on chronic (HCC)   Myoclonic jerking  1. Acute kidney injury chronic kidney disease with hypokalemia. Continue to improve renal function with serum cr down 1.8 with K at 2,7 and serum bicarbonate at 25. Renal US with no signs of hydronephrosis, but signs of obstructive outlet bladder condition. Will continue to follow renal function in am. IV fluids at 75 ml per hour of half normal saline. Na at 142 and Cl at 105. Replete K and follow renal panel this pm.   2. Type 2 diabetes mellitus Glucose cover and monitoring, capillary glucose 154, 283, 296, 138, and 163.  No nausea or vomiting.   3. Hypertension. Stable blood pressure with systolic 237.  4. Atrial fibrillation. Not on any AV blockade, not on anticoagulation (due to anemia), reported telemetry episodes of V tach 4 beats. Old records personally reviewed echocardiogram 01/2017 with normal LV systolic function EF 50 to 55% with hypokinesis in the apical - inferior-septal region.   5. Gout. Stable with no signs of exacerbation.   6. Urinary tract infection. Urine culture, with multiple bacteria, will continue with ceftriaxone therapy.    DVT prophylaxis: heparin  Code Status:  full Family Communication: no family at the bedside Disposition Plan: home   Consultants:     Procedures:     Antimicrobials:       Subjective: Patient is feeling better, no further jerking movements, no nausea, vomiting, positive for weakness.  Objective: Vitals:   08/28/17 1652 08/28/17 2020 08/29/17 0500 08/29/17 1044  BP: 140/77 (!) 154/80 (!) 149/75 (!) 158/78  Pulse: 70 65 (!) 57 61  Resp: 18 17 14 12   Temp: 98.7 F (37.1 C) 97.7 F (36.5 C) 98.6 F (37 C) 98.4 F (36.9 C)  TempSrc: Oral Oral Oral Oral  SpO2: 95% 98% 96% 96%  Weight:  99.9 kg (220 lb 3.2 oz)    Height:        Intake/Output Summary (Last 24 hours) at 08/29/2017 1157 Last data filed at 08/29/2017 0837 Gross per 24 hour  Intake 1490 ml  Output 100 ml  Net 1390 ml   Filed Weights   08/27/17 2348 08/28/17 2020  Weight: 94.8 kg (209 lb) 99.9 kg (220 lb 3.2 oz)    Examination:  General: Not in pain or dyspnea, deconditioned Neurology: Awake and alert, non focal  E ENT: mild pallor, no icterus, oral mucosa moist Cardiovascular: No JVD. S1-S2 present, rhythmic, no gallops, rubs, or murmurs. No lower extremity edema. Pulmonary: decreased breath sounds bilaterally at bases, adequate air movement, no wheezing, rhonchi or rales. Gastrointestinal. Abdomen prtuberant, no organomegaly, non tender, no rebound or  guarding Skin. No rashes Musculoskeletal: no joint deformities     Data Reviewed: I have personally reviewed following labs and imaging studies  CBC: Recent Labs  Lab 08/24/17 1819 08/27/17 1600 08/28/17 0035 08/28/17 0238  WBC 9.9 13.3* 12.0* 12.1*  NEUTROABS 7.6  --   --   --   HGB 14.7 12.9* 11.2* 11.3*  HCT 46.5 39.8 35.7* 35.6*  MCV 95.7 94.3 94.7 94.4  PLT 274 277 283 865   Basic Metabolic Panel: Recent Labs  Lab 08/24/17 1819 08/27/17 1600 08/28/17 0035 08/28/17 0238 08/29/17 0439  NA 138 138  --  140 142  K 3.1* 3.5  --  3.4* 2.7*  CL 93* 96*  --  99* 105  CO2 25 20*  --  23 25  GLUCOSE 234* 262*  --  279* 167*  BUN 13 43*  --  50* 35*  CREATININE 1.86* 3.40* 3.73* 3.49* 1.81*  CALCIUM 8.0* 8.3*  --  7.7* 7.4*   GFR: Estimated Creatinine Clearance: 46.8 mL/min (A) (by C-G formula based on SCr of 1.81 mg/dL (H)). Liver Function Tests: Recent Labs  Lab 08/24/17 1819 08/27/17 2140  AST 31 36  ALT 17 20  ALKPHOS 38 36*  BILITOT 0.8 0.7  PROT 8.1 7.6  ALBUMIN 3.8 3.4*   No results for input(s): LIPASE, AMYLASE in the last 168 hours. Recent Labs  Lab 08/27/17 2140  AMMONIA 25   Coagulation Profile: No results for input(s): INR, PROTIME in the last 168 hours. Cardiac Enzymes: No results for input(s): CKTOTAL, CKMB, CKMBINDEX, TROPONINI in the last 168 hours. BNP (last 3 results) Recent Labs    09/25/16 1122 12/23/16 1333 01/01/17 1038  PROBNP 208 286* 180   HbA1C: Recent Labs    08/27/17 1408  HGBA1C 7.9   CBG: Recent Labs  Lab 08/28/17 0737 08/28/17 1153 08/28/17 1705 08/28/17 2033 08/29/17 0746  GLUCAP 121* 154* 283* 296* 138*   Lipid Profile: No results for input(s): CHOL, HDL, LDLCALC, TRIG, CHOLHDL, LDLDIRECT in the last 72 hours. Thyroid Function Tests: No results for input(s): TSH, T4TOTAL, FREET4, T3FREE, THYROIDAB in the last 72 hours. Anemia Panel: No results for input(s): VITAMINB12, FOLATE, FERRITIN, TIBC, IRON,  RETICCTPCT in the last 72 hours.    Radiology Studies: I have reviewed all of the imaging during this hospital visit personally     Scheduled Meds: . allopurinol  100 mg Oral BID  . aspirin EC  81 mg Oral Daily  . atorvastatin  40 mg Oral Daily  . heparin  5,000 Units Subcutaneous Q8H  . insulin aspart  0-9 Units Subcutaneous TID WC  . insulin detemir  15 Units Subcutaneous Q2200  . potassium chloride  40 mEq Oral BID  . predniSONE  5 mg Oral Q breakfast  . traMADol  50 mg Oral Once   Continuous Infusions: . cefTRIAXone (ROCEPHIN)  IV Stopped (08/29/17 7846)     LOS: 1 day        Leodan Bolyard Gerome Apley, MD [facility] Pager [phone]

## 2017-08-29 NOTE — Progress Notes (Signed)
Received call from central telemetry that patient had 4 beats vtach. Patient asymptomatic. VSS. Rating pain in right knee 6-7/10 and usual pain in back/chest 6-7/10. Message sent to Dr. Cathlean Sauer. Will continue to monitor. Bartholomew Crews, RN

## 2017-08-30 DIAGNOSIS — N182 Chronic kidney disease, stage 2 (mild): Secondary | ICD-10-CM

## 2017-08-30 DIAGNOSIS — M109 Gout, unspecified: Secondary | ICD-10-CM

## 2017-08-30 LAB — GLUCOSE, CAPILLARY
GLUCOSE-CAPILLARY: 100 mg/dL — AB (ref 65–99)
GLUCOSE-CAPILLARY: 179 mg/dL — AB (ref 65–99)
GLUCOSE-CAPILLARY: 197 mg/dL — AB (ref 65–99)
Glucose-Capillary: 140 mg/dL — ABNORMAL HIGH (ref 65–99)

## 2017-08-30 LAB — TROPONIN I
Troponin I: 0.06 ng/mL (ref <0.03)
Troponin I: 0.09 ng/mL (ref <0.03)
Troponin I: 0.07 ng/mL (ref <0.03)

## 2017-08-30 LAB — BASIC METABOLIC PANEL
Anion gap: 11 (ref 5–15)
BUN: 19 mg/dL (ref 6–20)
CALCIUM: 7.6 mg/dL — AB (ref 8.9–10.3)
CO2: 26 mmol/L (ref 22–32)
CREATININE: 1.41 mg/dL — AB (ref 0.61–1.24)
Chloride: 105 mmol/L (ref 101–111)
GFR calc Af Amer: >60 mL/min (ref >60)
GFR, EST NON AFRICAN AMERICAN: 52 mL/min — AB (ref >60)
GLUCOSE: 108 mg/dL — AB (ref 65–99)
Potassium: 2.9 mmol/L — ABNORMAL LOW (ref 3.5–5.1)
SODIUM: 142 mmol/L (ref 135–145)

## 2017-08-30 MED ORDER — TRAMADOL HCL 50 MG PO TABS
50.0000 mg | ORAL_TABLET | Freq: Once | ORAL | Status: AC
  Administered 2017-08-30: 50 mg via ORAL

## 2017-08-30 MED ORDER — DICLOFENAC SODIUM 1 % TD GEL
2.0000 g | Freq: Four times a day (QID) | TRANSDERMAL | Status: DC
  Administered 2017-08-30 – 2017-08-31 (×6): 2 g via TOPICAL
  Filled 2017-08-30: qty 100

## 2017-08-30 MED ORDER — POTASSIUM CHLORIDE 20 MEQ PO PACK
40.0000 meq | PACK | ORAL | Status: DC
  Filled 2017-08-30 (×2): qty 2

## 2017-08-30 MED ORDER — ALPRAZOLAM 0.25 MG PO TABS
0.2500 mg | ORAL_TABLET | Freq: Once | ORAL | Status: AC
  Administered 2017-08-30: 0.25 mg via ORAL
  Filled 2017-08-30: qty 1

## 2017-08-30 MED ORDER — POTASSIUM CHLORIDE CRYS ER 20 MEQ PO TBCR
40.0000 meq | EXTENDED_RELEASE_TABLET | ORAL | Status: AC
  Administered 2017-08-30 (×2): 40 meq via ORAL
  Filled 2017-08-30 (×2): qty 2

## 2017-08-30 MED ORDER — PREDNISONE 20 MG PO TABS
40.0000 mg | ORAL_TABLET | Freq: Every day | ORAL | Status: DC
  Administered 2017-08-31: 40 mg via ORAL
  Filled 2017-08-30: qty 2

## 2017-08-30 MED ORDER — HYDROCODONE-ACETAMINOPHEN 5-325 MG PO TABS
2.0000 | ORAL_TABLET | Freq: Once | ORAL | Status: AC
  Administered 2017-08-30: 2 via ORAL
  Filled 2017-08-30: qty 2

## 2017-08-30 NOTE — Progress Notes (Signed)
PROGRESS NOTE    Marco [name]  CZY:[id number] DOB: [birth year] DOA: 08/27/2017 PCP: [name], MD    Brief Narrative:  62 year old male presented with jerking movements of his upper extremities. He does have significant past med history of diabetes 2, chronic kidney disease stage III,sleep apnea, heart failure, atrial fibrillation. For last 3 days he has noticed increased jerking movements upper extremities, recent gout attack on his right knee, received prednisone. On his initial physical examination blood pressure 149/86, heart rate 76, respiratory 18, temperature 98.3, oxygenation 98%.Moist mucous membranes, lungs clear to auscultation bilaterally, heart S1-S2 present rhythmic, abdomen soft nontender, no lower extremity edema.Sodium 138, potassium 3.5, bicarbonate 20, chloride 96, glucose 262, BUN 43, creatinine 3.40, white count 13.3, hemoglobin 12.9, hematocrit 39.8, platelets 277. Urinalysis with 6-30 white cells, specific gravity 1.018, proteins negative.   Patient was admitted to the hospital with the working diagnosis of acute kidney injury complicated by hypoalemia.   Assessment & Plan:   Principal Problem:   ARF (acute renal failure) (HCC) Active Problems:   Persistent atrial fibrillation (HCC)   Essential hypertension   DM2 (diabetes mellitus, type 2) (HCC)   OSA (obstructive sleep apnea)   Chronic gouty arthritis   Renal failure (ARF), acute on chronic (HCC)   Myoclonic jerking   1.Acute kidney injury chronic kidney disease stage 2, with hypokalemia. Renal function with serum cr down 1.41 ( at it's baseline) with K at 2,9 and serum bicarbonate at 26. Hold on IV fluids. Continue K repletion with KCl will give total of 80 meq in 2 divided doses. Follow on renal panel in am.  2.Type 2 diabetes mellitus Glucose cover and monitoring with sliding scale capillary glucose 138, 163, 212, 217, 100, 140. Will continue basal insulin with levimir, patient will be placed  on steroids that can trigger worsening hyperglycemia.  3.Hypertension. Stable blood pressure with systolic 093.  4.Atrial fibrillation. LV systolic function EF 50 to 55% with hypokinesis in the apical - inferior-septal region, not on anticoagulation due to anemia and suspected bleeding.  Telemetry on sinus rhythm, at 60 bpm. Will discontinue telemetry for now.   5.Gout with acute exacerbation. Will order systemic steroids with oral prednisone along with topical voltaren. Continue allopurinol and will check serum uric acid. Patient follow's at [facility].   6.Urinary tract infection. Continue with ceftriaxone therapy. Will plan for complete 3 days.    DVT prophylaxis:heparin Code Status:full Family Communication:no family at the bedside Disposition Plan:home   Consultants:    Procedures:    Antimicrobials   Subjective: Patient had recurrent right knee pain, sever in intensity, worse to touch and movement, has been refractive to analgesics, no radiation, severe in intensity, constant. No dyspnea, nausea or vomiting, associated.   Objective: Vitals:   08/29/17 2149 08/30/17 0215 08/30/17 0548 08/30/17 1003  BP: (!) 164/87 (!) 146/89 (!) 148/88 (!) 166/85  Pulse: 60 (!) 59 62 65  Resp: 16 16 16 18   Temp: 98.3 F (36.8 C) 98.9 F (37.2 C) 98.5 F (36.9 C) 98.2 F (36.8 C)  TempSrc: Oral Oral Oral Oral  SpO2: 94% 97% 95% 96%  Weight: 99.9 kg (220 lb 4.8 oz)     Height:        Intake/Output Summary (Last 24 hours) at 08/30/2017 1219 Last data filed at 08/30/2017 0618 Gross per 24 hour  Intake 1788 ml  Output 0 ml  Net 1788 ml   Filed Weights   08/27/17 2348 08/28/17 2020 08/29/17 2149  Weight: 94.8 kg (209 lb) 99.9 kg (220 lb 3.2 oz) 99.9 kg (220 lb 4.8 oz)    Examination:   General: In pain Neurology: Awake and alert, non focal  E ENT: no pallor, no icterus, oral mucosa moist Cardiovascular: No JVD. S1-S2 present, rhythmic,  no gallops, rubs, or murmurs. No lower extremity edema. Pulmonary: vesicular breath sounds bilaterally, adequate air movement, no wheezing, rhonchi or rales. Gastrointestinal. Abdomen flat, no organomegaly, non tender, no rebound or guarding Skin. No rashes Musculoskeletal: Hypertrophic right knee, no increase local temperature, or erythema, decreased range of motion due to pain.      Data Reviewed: I have personally reviewed following labs and imaging studies  CBC: Recent Labs  Lab 08/24/17 1819 08/27/17 1600 08/28/17 0035 08/28/17 0238  WBC 9.9 13.3* 12.0* 12.1*  NEUTROABS 7.6  --   --   --   HGB 14.7 12.9* 11.2* 11.3*  HCT 46.5 39.8 35.7* 35.6*  MCV 95.7 94.3 94.7 94.4  PLT 274 277 283 671   Basic Metabolic Panel: Recent Labs  Lab 08/27/17 1600 08/28/17 0035 08/28/17 0238 08/29/17 0439 08/29/17 1449 08/30/17 0718  NA 138  --  140 142 140 142  K 3.5  --  3.4* 2.7* 3.0* 2.9*  CL 96*  --  99* 105 103 105  CO2 20*  --  23 25 24 26   GLUCOSE 262*  --  279* 167* 222* 108*  BUN 43*  --  50* 35* 26* 19  CREATININE 3.40* 3.73* 3.49* 1.81* 1.85* 1.41*  CALCIUM 8.3*  --  7.7* 7.4* 7.4* 7.6*   GFR: Estimated Creatinine Clearance: 60.1 mL/min (A) (by C-G formula based on SCr of 1.41 mg/dL (H)). Liver Function Tests: Recent Labs  Lab 08/24/17 1819 08/27/17 2140  AST 31 36  ALT 17 20  ALKPHOS 38 36*  BILITOT 0.8 0.7  PROT 8.1 7.6  ALBUMIN 3.8 3.4*   No results for input(s): LIPASE, AMYLASE in the last 168 hours. Recent Labs  Lab 08/27/17 2140  AMMONIA 25   Coagulation Profile: No results for input(s): INR, PROTIME in the last 168 hours. Cardiac Enzymes: Recent Labs  Lab 08/30/17 0013 08/30/17 0718  TROPONINI 0.06* 0.09*   BNP (last 3 results) Recent Labs    09/25/16 1122 12/23/16 1333 01/01/17 1038  PROBNP 208 286* 180   HbA1C: Recent Labs    08/27/17 1408  HGBA1C 7.9   CBG: Recent Labs  Lab 08/29/17 1202 08/29/17 1631 08/29/17 2144  08/30/17 0723 08/30/17 1142  GLUCAP 163* 212* 217* 100* 140*   Lipid Profile: No results for input(s): CHOL, HDL, LDLCALC, TRIG, CHOLHDL, LDLDIRECT in the last 72 hours. Thyroid Function Tests: No results for input(s): TSH, T4TOTAL, FREET4, T3FREE, THYROIDAB in the last 72 hours. Anemia Panel: No results for input(s): VITAMINB12, FOLATE, FERRITIN, TIBC, IRON, RETICCTPCT in the last 72 hours.    Radiology Studies: I have reviewed all of the imaging during this hospital visit personally     Scheduled Meds: . allopurinol  100 mg Oral BID  . aspirin EC  81 mg Oral Daily  . atorvastatin  40 mg Oral Daily  . gi cocktail  30 mL Oral Once  . heparin  5,000 Units Subcutaneous Q8H  . insulin aspart  0-9 Units Subcutaneous TID WC  . insulin detemir  15 Units Subcutaneous Q2200  . potassium chloride  40 mEq Oral Q4H  . predniSONE  5 mg Oral Q breakfast  . traMADol  50 mg Oral Once  Continuous Infusions: . sodium chloride 75 mL/hr at 08/29/17 2350  . cefTRIAXone (ROCEPHIN)  IV Stopped (08/30/17 0526)     LOS: 2 days        Yvanna Vidas Gerome Apley, MD [facility] Pager [phone]

## 2017-08-30 NOTE — Progress Notes (Signed)
At approximately 2200, the patient complained of stabbing chest pain to mid chest that had started about an hour earlier.  VS stable.  Patient stated the chest pain felt different from the burning chest pain that had occurred the night before.  He stated he was not sure if it was due to indigestion or his heart.  MD made aware.  Order for GI Cocktail and EKG obtained.  Patient did not feel "comfortable" taking GI Cocktail and refused it.  Education provided to patient as what the action of each drug in the GI Cocktail was and still refused it.  EKG obtained and results called to MD.  Order for Norco 2 Tablets obtained x 1 and Troponin lab draw x 1 obtained.  Norco given at 2350.  Upon reassessment, pain is now 3/10.  Remains NSR on Telemetry.  Critical Troponin of 0.06 received and results called to MD.  2 additional Troponin levels ordered.  VS stable and patient is currently sleeping.  Will continue to monitor patient.  Earleen Reaper RN-BC, [employer]

## 2017-08-30 NOTE — Progress Notes (Signed)
Pt refused to wear C-PAP.

## 2017-08-30 NOTE — Progress Notes (Addendum)
Notified MD Arrien that pt refuses to take powdered K supplement. Pt requests pills. Awaiting orders.   Paulla Fore, RN    [phone] Pt states he can not breath and is crying. Checked O2 saturation and is 98% on RA.   1800 Notified MD Arrien that troponin was 0.07. Awaiting orders.

## 2017-08-30 NOTE — Evaluation (Signed)
Physical Therapy Evaluation Patient Details Name: Marco [name] MRN: [id number] DOB: Jun 15, 1956 Today's Date: 08/30/2017   History of Present Illness   Marco [name] is a 62 y.o. male with history of diabetes mellitus type 2, chronic kidney disease stage III, chronic pain, sleep apnea, chronic combined CHF, atrial fibrillation has been noticing last 3 days of increasing jerking movements of upper extremities.  Patient states that recently had gouty attack and was placed on increased dose of prednisone for 3 days which he had taken and his gout attack improved. Since admission, prednisone stopped. Knee pain worse. +UTI  Clinical Impression   Pt admitted with above diagnosis. Patient very resistant initially to idea of using a walker to assist with pain control. Ultimately agreed to try walking with RW, however only made it 4 ft from bed and then back to bed due to dyspnea and reports of chest pain. RN notified and in to assess pt who reported chest pain had subsided. Ultimately pt not sure the RW will help his leg (or back) pain but agreed to try using the RW in the room with nursing. Pt currently with functional limitations due to the deficits listed below (see PT Problem List).  Pt will benefit from skilled PT to increase their independence and safety with mobility to allow discharge to the venue listed below.       Follow Up Recommendations Home health PT(anticipate pt will refuse services)    Equipment Recommendations  Rolling walker with 5" wheels;3in1 (PT)    Recommendations for Other Services       Precautions / Restrictions Precautions Precautions: Fall Restrictions Weight Bearing Restrictions: No      Mobility  Bed Mobility Overal bed mobility: Modified Independent                Transfers Overall transfer level: Needs assistance Equipment used: Rolling walker (2 wheeled) Transfers: Sit to/from Stand Sit to Stand: Supervision         General transfer comment:  vc for proper use of RW  Ambulation/Gait Ambulation/Gait assistance: Supervision Ambulation Distance (Feet): 8 Feet Assistive device: Rolling walker (2 wheeled) Gait Pattern/deviations: Step-to pattern;Decreased stride length;Decreased stance time - right;Decreased weight shift to right;Antalgic     General Gait Details: pt resistant to use of RW "I can walk, I need them to figure out how to get rid of this pain."   Stairs            Wheelchair Mobility    Modified Rankin (Stroke Patients Only)       Balance                                             Pertinent Vitals/Pain Pain Assessment: 0-10 Pain Score: 5  Pain Location: rt knee Pain Descriptors / Indicators: Constant;Grimacing;Jabbing Pain Intervention(s): Limited activity within patient's tolerance;Monitored during session;Repositioned    Home Living Family/patient expects to be discharged to:: Private residence Living Arrangements: Alone     Home Access: Level entry     Home Layout: One level Home Equipment: None Additional Comments: pt reports he is familiar with use of RW as he has used in the past, but does not currently own RW.     Prior Function Level of Independence: Independent         Comments: currently reports sitting down on toilet is most painful (low height)  Hand Dominance        Extremity/Trunk Assessment   Upper Extremity Assessment Upper Extremity Assessment: Overall WFL for tasks assessed    Lower Extremity Assessment Lower Extremity Assessment: RLE deficits/detail RLE Deficits / Details: rt knee swollen and painful with any movement; able to achieved 90 degrees flexion for sit to stand RLE: Unable to fully assess due to pain       Communication   Communication: No difficulties  Cognition Arousal/Alertness: Awake/alert Behavior During Therapy: WFL for tasks assessed/performed                                          General  Comments General comments (skin integrity, edema, etc.): Increase time educating on purpose of RW including decrease Rt knee pain, decrease risk of falling, and decr back pain due to excessive limping due to RLE    Exercises     Assessment/Plan    PT Assessment Patient needs continued PT services  PT Problem List Decreased range of motion;Decreased activity tolerance;Decreased balance;Decreased mobility;Decreased knowledge of use of DME;Cardiopulmonary status limiting activity;Obesity;Pain       PT Treatment Interventions DME instruction;Gait training;Functional mobility training;Therapeutic activities;Therapeutic exercise;Patient/family education    PT Goals (Current goals can be found in the Care Plan section)  Acute Rehab PT Goals Patient Stated Goal: wants pain in rt knee to stop PT Goal Formulation: With patient Time For Goal Achievement: 09/06/17 Potential to Achieve Goals: Good    Frequency Min 3X/week   Barriers to discharge Decreased caregiver support      Co-evaluation               AM-PAC PT "6 Clicks" Daily Activity  Outcome Measure Difficulty turning over in bed (including adjusting bedclothes, sheets and blankets)?: None Difficulty moving from lying on back to sitting on the side of the bed? : A Little Difficulty sitting down on and standing up from a chair with arms (e.g., wheelchair, bedside commode, etc,.)?: A Lot Help needed moving to and from a bed to chair (including a wheelchair)?: A Little Help needed walking in hospital room?: A Lot Help needed climbing 3-5 steps with a railing? : Total 6 Click Score: 15    End of Session   Activity Tolerance: Patient limited by pain;Treatment limited secondary to medical complications (Comment)(dyspnea, chest pain) Patient left: in bed;with nursing/sitter in room Nurse Communication: Mobility status PT Visit Diagnosis: Difficulty in walking, not elsewhere classified (R26.2);Pain Pain - Right/Left: Right Pain  - part of body: Knee    Time: 0900-0929 PT Time Calculation (min) (ACUTE ONLY): 29 min   Charges:   PT Evaluation $PT Eval Low Complexity: 1 Low PT Treatments $Gait Training: 8-22 mins   PT G Codes:          Rexanne Mano, PT Pager [phone] 08/30/2017, 9:42 AM

## 2017-08-30 NOTE — Progress Notes (Signed)
At approximately 0400, patient called RN to his room.  He was sitting on the side, rocking back and forth, and crying.  He stated his right knee was throbbing.  He was recently diagnosed with gout.  The knee is more swollen than his left.  Heat pack given to patient.  [facility] called and made aware of the situation.  Patient stated previous chest had resolved.  Received order for 2 Norco which was given at 0449.  Upon reassessment, patient has leg elevated on pillow with heating pack.  He states pain is 5/10 as long as he does not move it.  If he moves it, the pain becomes a 10/10.  Will continue to monitor patient.  Earleen Reaper RN-BC, [employer]

## 2017-08-31 DIAGNOSIS — E876 Hypokalemia: Secondary | ICD-10-CM

## 2017-08-31 LAB — BASIC METABOLIC PANEL
ANION GAP: 12 (ref 5–15)
Anion gap: 15 (ref 5–15)
BUN: 10 mg/dL (ref 6–20)
BUN: 13 mg/dL (ref 6–20)
CALCIUM: 8.1 mg/dL — AB (ref 8.9–10.3)
CALCIUM: 8.2 mg/dL — AB (ref 8.9–10.3)
CHLORIDE: 100 mmol/L — AB (ref 101–111)
CO2: 25 mmol/L (ref 22–32)
CO2: 21 mmol/L — AB (ref 22–32)
CREATININE: 1.56 mg/dL — AB (ref 0.61–1.24)
Chloride: 106 mmol/L (ref 101–111)
Creatinine, Ser: 1.31 mg/dL — ABNORMAL HIGH (ref 0.61–1.24)
GFR calc Af Amer: >60 mL/min (ref >60)
GFR calc non Af Amer: 46 mL/min — ABNORMAL LOW (ref >60)
GFR, EST AFRICAN AMERICAN: 53 mL/min — AB (ref >60)
GFR, EST NON AFRICAN AMERICAN: 57 mL/min — AB (ref >60)
GLUCOSE: 163 mg/dL — AB (ref 65–99)
GLUCOSE: 324 mg/dL — AB (ref 65–99)
Potassium: 2.9 mmol/L — ABNORMAL LOW (ref 3.5–5.1)
Potassium: 4 mmol/L (ref 3.5–5.1)
Sodium: 143 mmol/L (ref 135–145)
Sodium: 136 mmol/L (ref 135–145)

## 2017-08-31 LAB — GLUCOSE, CAPILLARY
Glucose-Capillary: 166 mg/dL — ABNORMAL HIGH (ref 65–99)
Glucose-Capillary: 187 mg/dL — ABNORMAL HIGH (ref 65–99)
Glucose-Capillary: 332 mg/dL — ABNORMAL HIGH (ref 65–99)

## 2017-08-31 LAB — URIC ACID: Uric Acid, Serum: 7.2 mg/dL (ref 4.4–7.6)

## 2017-08-31 MED ORDER — ALPRAZOLAM 0.5 MG PO TABS
1.0000 mg | ORAL_TABLET | Freq: Three times a day (TID) | ORAL | Status: DC | PRN
  Administered 2017-08-31: 1 mg via ORAL
  Filled 2017-08-31: qty 2

## 2017-08-31 MED ORDER — KETOROLAC TROMETHAMINE 30 MG/ML IJ SOLN
30.0000 mg | Freq: Once | INTRAMUSCULAR | Status: AC
  Administered 2017-08-31: 30 mg via INTRAVENOUS

## 2017-08-31 MED ORDER — ALPRAZOLAM 0.25 MG PO TABS
0.2500 mg | ORAL_TABLET | Freq: Once | ORAL | Status: AC
  Administered 2017-08-31: 0.25 mg via ORAL
  Filled 2017-08-31: qty 1

## 2017-08-31 MED ORDER — DICLOFENAC SODIUM 1 % TD GEL: 2.0000 g | Freq: Four times a day (QID) | TRANSDERMAL | 0 refills | Status: DC

## 2017-08-31 MED ORDER — POTASSIUM CHLORIDE CRYS ER 20 MEQ PO TBCR: 40.0000 meq | EXTENDED_RELEASE_TABLET | ORAL | Status: DC

## 2017-08-31 MED ORDER — ACETAMINOPHEN 500 MG PO TABS: 500.0000 mg | ORAL_TABLET | Freq: Four times a day (QID) | ORAL | 0 refills | Status: DC | PRN

## 2017-08-31 MED ORDER — POTASSIUM CHLORIDE CRYS ER 20 MEQ PO TBCR
40.0000 meq | EXTENDED_RELEASE_TABLET | ORAL | Status: AC
  Administered 2017-08-31 (×2): 40 meq via ORAL
  Filled 2017-08-31 (×3): qty 2

## 2017-08-31 MED ORDER — PREDNISONE 20 MG PO TABS: 20.0000 mg | ORAL_TABLET | Freq: Every day | ORAL | 0 refills | Status: DC

## 2017-08-31 MED ORDER — KETOROLAC TROMETHAMINE 30 MG/ML IJ SOLN
INTRAMUSCULAR | Status: AC
  Filled 2017-08-31: qty 1

## 2017-08-31 NOTE — Discharge Summary (Signed)
Physician Discharge Summary  Marco [name] ZHY:[id number] DOB: 22-Jul-1956 DOA: 08/27/2017  PCP: [name], MD  Admit date: 08/27/2017 Discharge date: 08/31/2017  Admitted From: Home Disposition:  Home  Recommendations for Outpatient Follow-up:  1. Follow up with PCP in 1- week 2. Patient placed on prednisone taper for acute gout 3. Continue allopurinol  Home Health: no   Equipment/Devices: no   Discharge Condition: Stable CODE STATUS: Full  Diet recommendation: Heart healthy and diabetic prudent  Brief/Interim Summary: 62 year old male presented with jerking movements of his upper extremities. He does have significant past medical history of diabetes mellitus type 2, chronic kidney disease stage III,sleep apnea, heart failure, atrial fibrillation. For last 3 days he has noticed increased jerking movements upper extremities, recent gout attack on his right knee, received prednisone. On his initial physical examination blood pressure 149/86, heart rate 76, respiratory 18, temperature 98.3, oxygenation 98%.Moist mucous membranes, lungs clear to auscultation bilaterally, heart S1-S2 present rhythmic, abdomen soft nontender, no lower extremity edema.Sodium 138, potassium 3.5, bicarbonate 20, chloride 96,glucose 262, BUN 43, creatinine 3.40, white count 13.3, hemoglobin 12.9, hematocrit 39.8, platelets 277.Urinalysis with 6-30 white cells, specific gravity 1.018, proteins negative.  Patient was admitted to the hospitalwith theworking diagnosis of acute kidney injurycomplicated by hypokalemia and urinary tract infection.   1. Acute kidney injury and chronic kidney disease stage II complicated by hypokalemia. Patient was admitted to the medical unit he was placed on a remote telemetry monitor, he received IV fluids with improvement of his kidney function, discharge creatinine back to its baseline, 1.31. Potassium has been repleted. Agent will follow-up as an outpatient. Resume  furosemide as outpatient regimen.    2. Atrial fibrillation, paroxysmal. Patient remained rate controlled, no anticoagulation due to history of anemia, suspected to be bleeding related, follow-up as an outpatient. Patient had elevated troponins, in the setting of acute kidney injury on chronic kidney disease, ruled out for acute coronary syndrome. EKG negative for ischemia, normal sinus rhythm.   3. Type 2 diabetes mellitus. Patient was placed on insulin sliding-scale for glucose coverage and monitoring, basal insulin regimen with Levemir, capillary glucose remained well-controlled. Patient will continue metformin and home insulin regimen at discharge.   4. Acute gout flare. Patient developed significant pain on his right knee, improved with prednisone and topical Voltaren. His uric acid was noted to be elevated 7.2. Continue allopurinol and follow-up as an outpatient with the rheumatology clinic.  5. Urinary tract infection. Patient was placed on ceftriaxone for 4 days. No signs of sepsis.   6. Acute anxiety attack. Patient had episodes of anxiety at night, hat responded alprazolam.   7. Hypertension. Blood pressure remained well-controlled, continue losartan.   Discharge Diagnoses:  Principal Problem:   ARF (acute renal failure) (HCC) Active Problems:   Persistent atrial fibrillation (HCC)   Essential hypertension   DM2 (diabetes mellitus, type 2) (HCC)   OSA (obstructive sleep apnea)   Chronic gouty arthritis   Renal failure (ARF), acute on chronic (HCC)   Myoclonic jerking    Discharge Instructions   Allergies as of 08/31/2017   No Known Allergies     Medication List    STOP taking these medications   albuterol 108 (90 Base) MCG/ACT inhaler Commonly known as:  PROVENTIL HFA;VENTOLIN HFA     TAKE these medications   ACCU-CHEK AVIVA PLUS test strip Generic drug:  glucose blood USE ONE STRIP TO CHECK BLOOD GLUCOSE 3 TIMES DAILY   glucose blood test strip Commonly known  as:  ACCU-CHEK AVIVA PLUS 1 each by Other route 3 (three) times daily. E11.9   ACCU-CHEK AVIVA PLUS w/Device Kit 1 Device by Does not apply route 3 (three) times daily after meals. E11.9   ACCU-CHEK FASTCLIX LANCETS Misc Use a directed to check blood sugar 3 times daily.   acetaminophen 500 MG tablet Commonly known as:  TYLENOL Take 1 tablet (500 mg total) by mouth every 6 (six) hours as needed for moderate pain (or Fever >/= 101).   allopurinol 100 MG tablet Commonly known as:  ZYLOPRIM Take 100 mg by mouth 2 (two) times daily.   aspirin 81 MG EC tablet Commonly known as:  EQ ASPIRIN ADULT LOW DOSE Take 1 tablet (81 mg total) by mouth daily. Swallow whole.   atorvastatin 40 MG tablet Commonly known as:  LIPITOR Take 1 tablet (40 mg total) by mouth daily.   BD PEN NEEDLE NANO U/F 32G X 4 MM Misc Generic drug:  Insulin Pen Needle USE ONE PEN NEEDLE THREE TIMES DAILY   Blood Pressure Kit 1 each by Does not apply route 2 (two) times daily.   COSENTYX 150 MG/ML Sosy Generic drug:  Secukinumab Inject 150 mg into the skin every 28 (twenty-eight) days.   diclofenac sodium 1 % Gel Commonly known as:  VOLTAREN Apply 2 g topically 4 (four) times daily. Place over right knee for 5 days.   furosemide 40 MG tablet Commonly known as:  LASIX Take 1 tablet by mouth every morning What changed:    how much to take  how to take this  when to take this  additional instructions   insulin aspart 100 UNIT/ML FlexPen Commonly known as:  NOVOLOG FLEXPEN Inject 14-24 Units into the skin 3 (three) times daily with meals.   Insulin Detemir 100 UNIT/ML Pen Commonly known as:  LEVEMIR FLEXTOUCH Inject 50 Units into the skin daily at 10 pm.   losartan 50 MG tablet Commonly known as:  COZAAR Take 50 mg by mouth daily.   metFORMIN 500 MG 24 hr tablet Commonly known as:  GLUCOPHAGE-XR Take 2 tablets (1,000 mg total) by mouth 2 (two) times daily.   predniSONE 5 MG tablet Commonly  known as:  DELTASONE Take 5 mg by mouth daily. What changed:  Another medication with the same name was changed. Make sure you understand how and when to take each.   predniSONE 20 MG tablet Commonly known as:  DELTASONE Take 1 tablet (20 mg total) by mouth daily with breakfast. Take on tablet daily for 5 days then half tablet daily for 5 days. Start taking on:  09/01/2017 What changed:    how much to take  how to take this  when to take this  additional instructions   pregabalin 100 MG capsule Commonly known as:  LYRICA Take 1 capsule (100 mg total) by mouth 3 (three) times daily.   ranitidine 150 MG tablet Commonly known as:  ZANTAC Take 1 tablet (150 mg total) by mouth at bedtime. What changed:  when to take this   zolpidem 6.25 MG CR tablet Commonly known as:  AMBIEN CR Take 1 tablet (6.25 mg total) by mouth at bedtime as needed for sleep.       No Known Allergies  Consultations:     Procedures/Studies: US Renal  Result Date: 08/28/2017 CLINICAL DATA:  62 year old male with acute renal failure. EXAM: RENAL / URINARY TRACT ULTRASOUND COMPLETE COMPARISON:  Renal ultrasound dated 09/03/2016 FINDINGS: Evaluation is limited due to patient's body habitus.  Right Kidney: Length: 13.5 cm. Mildly echogenic. There is mild parenchymal atrophy and cortical thinning. There is a 3.8 x 2.6 x 3.0 cm upper pole cyst. There is no hydronephrosis or shadowing stone. Left Kidney: Length: 12.7 cm. Mildly echogenic. Mild parenchymal atrophy and cortical thinning. There is a 1.9 x 1.5 x 1.7 cm mid to lower pole probable cyst. There is no hydronephrosis or shadowing stone. Bladder: Mildly trabeculated appearance of the bladder wall likely related to chronic bladder outlet obstruction. The bladder is otherwise unremarkable. Bilateral ureteral jets noted. IMPRESSION: 1. No hydronephrosis or shadowing stone. 2. Mildly echogenic kidneys with cortical thinning similar to prior ultrasound. 3. Mildly  trabeculated bladder wall likely related to chronic bladder outlet obstruction. Electronically Signed   By: Anner Crete M.D.   On: 08/28/2017 06:55   Dg Knee Complete 4 Views Right  Result Date: 08/24/2017 CLINICAL DATA:  Patient with right knee pain and swelling for 3 days. EXAM: RIGHT KNEE - COMPLETE 4+ VIEW COMPARISON:  None. FINDINGS: Normal anatomic alignment. No evidence for acute fracture or dislocation. Mild degenerative changes. No joint effusion. Vascular calcifications. IMPRESSION: No acute osseous abnormality. Electronically Signed   By: Lovey Newcomer M.D.   On: 08/24/2017 19:55       Subjective: Patient had episode of anxiety and chest pain, improved with anxiolytics. This am knee pain is improved, poor appetite. Patient anxious about discharge.  In the pm patient more calm, no further chest pain, and improved knee pain.     Discharge Exam: Vitals:   08/31/17 0301 08/31/17 0419  BP: (!) 155/103 (!) 166/96  Pulse: 80 88  Resp:  18  Temp:  98.2 F (36.8 C)  SpO2:  98%   Vitals:   08/30/17 2208 08/31/17 0300 08/31/17 0301 08/31/17 0419  BP:  (!) 162/104 (!) 155/103 (!) 166/96  Pulse: 78 80 80 88  Resp: 18 (!) 22  18  Temp: 97.9 F (36.6 C) 98.3 F (36.8 C)  98.2 F (36.8 C)  TempSrc: Oral Oral  Oral  SpO2: 99% 99%  98%  Weight: 99.7 kg (219 lb 11.2 oz)     Height: '5\' 6"'$  (1.676 m)       General: Not in pain or dyspnea  Neurology: Awake and alert, non focal  E ENT: no pallor, no icterus, oral mucosa moist Cardiovascular: No JVD. S1-S2 present, rhythmic, no gallops, rubs, or murmurs. No lower extremity edema. Pulmonary: vesicular breath sounds bilaterally, adequate air movement, no wheezing, rhonchi or rales. Gastrointestinal. Abdomen protuberant, no organomegaly, non tender, no rebound or guarding Skin. No rashes Musculoskeletal: no joint deformities/ right knee hypertrophic.    The results of significant diagnostics from this hospitalization (including  imaging, microbiology, ancillary and laboratory) are listed below for reference.     Microbiology: Recent Results (from the past 240 hour(s))  Urine Culture     Status: Abnormal   Collection Time: 08/27/17 10:15 PM  Result Value Ref Range Status   Specimen Description URINE, CLEAN CATCH  Final   Special Requests NONE  Final   Culture MULTIPLE SPECIES PRESENT, SUGGEST RECOLLECTION (A)  Final   Report Status 08/29/2017 FINAL  Final     Labs: BNP (last 3 results) Recent Labs    09/02/16 1323 09/06/16 0240 12/04/16 1451  BNP 203.3* 58.3 09.9   Basic Metabolic Panel: Recent Labs  Lab 08/28/17 0238 08/29/17 0439 08/29/17 1449 08/30/17 0718 08/31/17 0643  NA 140 142 140 142 143  K 3.4* 2.7* 3.0* 2.9*  2.9*  CL 99* 105 103 105 106  CO2 '23 25 24 26 25  '$ GLUCOSE 279* 167* 222* 108* 163*  BUN 50* 35* 26* 19 10  CREATININE 3.49* 1.81* 1.85* 1.41* 1.31*  CALCIUM 7.7* 7.4* 7.4* 7.6* 8.1*   Liver Function Tests: Recent Labs  Lab 08/24/17 1819 08/27/17 2140  AST 31 36  ALT 17 20  ALKPHOS 38 36*  BILITOT 0.8 0.7  PROT 8.1 7.6  ALBUMIN 3.8 3.4*   No results for input(s): LIPASE, AMYLASE in the last 168 hours. Recent Labs  Lab 08/27/17 2140  AMMONIA 25   CBC: Recent Labs  Lab 08/24/17 1819 08/27/17 1600 08/28/17 0035 08/28/17 0238  WBC 9.9 13.3* 12.0* 12.1*  NEUTROABS 7.6  --   --   --   HGB 14.7 12.9* 11.2* 11.3*  HCT 46.5 39.8 35.7* 35.6*  MCV 95.7 94.3 94.7 94.4  PLT 274 277 283 277   Cardiac Enzymes: Recent Labs  Lab 08/30/17 0013 08/30/17 0718 08/30/17 1158  TROPONINI 0.06* 0.09* 0.07*   BNP: Invalid input(s): POCBNP CBG: Recent Labs  Lab 08/30/17 1142 08/30/17 1607 08/30/17 2029 08/31/17 1100 08/31/17 1134  GLUCAP 140* 179* 197* 166* 187*   D-Dimer No results for input(s): DDIMER in the last 72 hours. Hgb A1c No results for input(s): HGBA1C in the last 72 hours. Lipid Profile No results for input(s): CHOL, HDL, LDLCALC, TRIG, CHOLHDL,  LDLDIRECT in the last 72 hours. Thyroid function studies No results for input(s): TSH, T4TOTAL, T3FREE, THYROIDAB in the last 72 hours.  Invalid input(s): FREET3 Anemia work up No results for input(s): VITAMINB12, FOLATE, FERRITIN, TIBC, IRON, RETICCTPCT in the last 72 hours. Urinalysis    Component Value Date/Time   COLORURINE YELLOW 08/27/2017 2226   APPEARANCEUR CLEAR 08/27/2017 2226   LABSPEC 1.018 08/27/2017 2226   PHURINE 5.0 08/27/2017 2226   GLUCOSEU NEGATIVE 08/27/2017 2226   HGBUR NEGATIVE 08/27/2017 2226   BILIRUBINUR NEGATIVE 08/27/2017 2226   BILIRUBINUR negative 12/04/2016 1639   KETONESUR NEGATIVE 08/27/2017 2226   PROTEINUR NEGATIVE 08/27/2017 2226   UROBILINOGEN 0.2 12/04/2016 1639   UROBILINOGEN 0.2 04/14/2015 1932   NITRITE NEGATIVE 08/27/2017 2226   LEUKOCYTESUR SMALL (A) 08/27/2017 2226   Sepsis Labs Invalid input(s): PROCALCITONIN,  WBC,  LACTICIDVEN Microbiology Recent Results (from the past 240 hour(s))  Urine Culture     Status: Abnormal   Collection Time: 08/27/17 10:15 PM  Result Value Ref Range Status   Specimen Description URINE, CLEAN CATCH  Final   Special Requests NONE  Final   Culture MULTIPLE SPECIES PRESENT, SUGGEST RECOLLECTION (A)  Final   Report Status 08/29/2017 FINAL  Final     Time coordinating discharge: 45 minutes  SIGNED:   Tawni Millers, MD  [facility] 08/31/2017, 4:19 PM Pager [phone]  If 7PM-7AM, please contact night-coverage [email or web address] Password [email or web address]

## 2017-08-31 NOTE — Progress Notes (Signed)
At approximately 0250, called to the patient's room.  He is sitting in chair - Crying, SOB, stating he was awoken from sleep with sudden sharp, stabbing chest pain, rating as 10/10.  B/P in right arm is 162/104 HR - 80 and 99% on Room Air.  B/P in left arm is 155/103 HR -80.  [facility] made aware.  Received order for Toradol 30mg  IV, which was given at 0335.  IV Hydralazine 10mg  was given at 0324.  At 0419, B/P rechecked and 166/96.  He still stated chest pain was 7/10.  PRN Norco, 1 tablet, was given at 0436.  At [place], patient states chest pain is 4/10.  He continues to cry, loudly enough to be heard in the hallway.  He states he wants to go home that he does not feel that he is getting any better while in the hospital.  He also states that he cannot get comfortable and cannot get any sleep.  Each night, he refuses the ambien for sleep.  I asked if he wanted me to check with MD and get another order for Xanax to help him relax.  He stated yes.  [facility] called and made aware.  Order for Xanax 0.25 mg PO received and given to patient.  As of this point, patient is more calm and laying in the bed.  Will continue to monitor patient.  Earleen Reaper RN-BC, [employer]

## 2017-08-31 NOTE — Progress Notes (Addendum)
PROGRESS NOTE    Marco [name]  NFA:[id number] DOB: 19-Jul-1956 DOA: 08/27/2017 PCP: [name], MD    Brief Narrative:  62 year old male presented with jerking movements of his upper extremities. He does have significant past med history of diabetes 2, chronic kidney disease stage III,sleep apnea, heart failure, atrial fibrillation. For last 3 days he has noticed increased jerking movements upper extremities, recent gout attack on his right knee, received prednisone. On his initial physical examination blood pressure 149/86, heart rate 76, respiratory 18, temperature 98.3, oxygenation 98%.Moist mucous membranes, lungs clear to auscultation bilaterally, heart S1-S2 present rhythmic, abdomen soft nontender, no lower extremity edema.Sodium 138, potassium 3.5, bicarbonate 20, chloride 96,glucose 262, BUN 43, creatinine 3.40, white count 13.3, hemoglobin 12.9, hematocrit 39.8, platelets 277.Urinalysis with 6-30 white cells, specific gravity 1.018, proteins negative.  Patient was admitted to the hospital with the working diagnosis of acute kidney injury complicated by hypoalemia.   Assessment & Plan:   Principal Problem:   ARF (acute renal failure) (HCC) Active Problems:   Persistent atrial fibrillation (HCC)   Essential hypertension   DM2 (diabetes mellitus, type 2) (HCC)   OSA (obstructive sleep apnea)   Chronic gouty arthritis   Renal failure (ARF), acute on chronic (HCC)   Myoclonic jerking  1.Acute kidney injury chronic kidney disease stage 2, with hypokalemia.Stable renal function with serum cr at 1,31. Persistent hypokalemia at K at 2,9 and serum bicarbonate at 25. Continue K repletion with KCl will give total of 80 meq in 2 divided doses. BMP check this pm. Chronic elevation of troponin, no acs.   2.Type 2 diabetes mellitusContinue gucose cover and monitoring with sliding scale with capillary glucose140 179, 197, 166, 187. Basal insulin with 15 units of levimir.    3.Hypertension.Off antihypertensive agents, blood pressure continue to be stable.  4.Atrial fibrillation. LV systolic function EF 50 to 55% with hypokinesis in the apical - inferior-septal region, not on anticoagulation due to anemia and suspected bleeding. Continue rate controlled.  5.Gout with acute exacerbation. Improved pain with prednisone, will continue local voltaren and allopurinol. Uirn acid elevated at 7.2. Folllow up with Rheumatology clinic.   6.Urinary tract infection. Completed 4 days of antibiotic therapy. Will hold on further antibiotic therapy.  DVT prophylaxis:heparin Code Status:full Family Communication:no family at the bedside Disposition Plan:home   Consultants:    Procedures:    Antimicrobials  Subjective: Patient had episode of anxiety and chest pain, improved with anxiolytics. This am knee pain is improved, poor appetite. Patient anxious about discharge.   Objective: Vitals:   08/30/17 2208 08/31/17 0300 08/31/17 0301 08/31/17 0419  BP:  (!) 162/104 (!) 155/103 (!) 166/96  Pulse: 78 80 80 88  Resp: 18 (!) 22  18  Temp: 97.9 F (36.6 C) 98.3 F (36.8 C)  98.2 F (36.8 C)  TempSrc: Oral Oral  Oral  SpO2: 99% 99%  98%  Weight: 99.7 kg (219 lb 11.2 oz)     Height: 5\' 6"  (1.676 m)       Intake/Output Summary (Last 24 hours) at 08/31/2017 1139 Last data filed at 08/31/2017 0600 Gross per 24 hour  Intake 970 ml  Output 0 ml  Net 970 ml   Filed Weights   08/28/17 2020 08/29/17 2149 08/30/17 2208  Weight: 99.9 kg (220 lb 3.2 oz) 99.9 kg (220 lb 4.8 oz) 99.7 kg (219 lb 11.2 oz)    Examination:   General: Not in pain or dyspnea, deconditioned Neurology: Awake and alert, non focal  E ENT: mild pallor, no icterus, oral mucosa moist Cardiovascular: No JVD. S1-S2 present, rhythmic, no gallops, rubs, or murmurs. trace lower extremity edema. Pulmonary: vesicular breath sounds bilaterally, adequate air movement, no  wheezing, rhonchi or rales. Gastrointestinal. Abdomen protuberant, no organomegaly, non tender, no rebound or guarding Skin. No rashes Musculoskeletal: no joint deformities. Positive hypertrophic deformity on the right knee.    Data Reviewed: I have personally reviewed following labs and imaging studies  CBC: Recent Labs  Lab 08/24/17 1819 08/27/17 1600 08/28/17 0035 08/28/17 0238  WBC 9.9 13.3* 12.0* 12.1*  NEUTROABS 7.6  --   --   --   HGB 14.7 12.9* 11.2* 11.3*  HCT 46.5 39.8 35.7* 35.6*  MCV 95.7 94.3 94.7 94.4  PLT 274 277 283 400   Basic Metabolic Panel: Recent Labs  Lab 08/28/17 0238 08/29/17 0439 08/29/17 1449 08/30/17 0718 08/31/17 0643  NA 140 142 140 142 143  K 3.4* 2.7* 3.0* 2.9* 2.9*  CL 99* 105 103 105 106  CO2 23 25 24 26 25   GLUCOSE 279* 167* 222* 108* 163*  BUN 50* 35* 26* 19 10  CREATININE 3.49* 1.81* 1.85* 1.41* 1.31*  CALCIUM 7.7* 7.4* 7.4* 7.6* 8.1*   GFR: Estimated Creatinine Clearance: 64.7 mL/min (A) (by C-G formula based on SCr of 1.31 mg/dL (H)). Liver Function Tests: Recent Labs  Lab 08/24/17 1819 08/27/17 2140  AST 31 36  ALT 17 20  ALKPHOS 38 36*  BILITOT 0.8 0.7  PROT 8.1 7.6  ALBUMIN 3.8 3.4*   No results for input(s): LIPASE, AMYLASE in the last 168 hours. Recent Labs  Lab 08/27/17 2140  AMMONIA 25   Coagulation Profile: No results for input(s): INR, PROTIME in the last 168 hours. Cardiac Enzymes: Recent Labs  Lab 08/30/17 0013 08/30/17 0718 08/30/17 1158  TROPONINI 0.06* 0.09* 0.07*   BNP (last 3 results) Recent Labs    09/25/16 1122 12/23/16 1333 01/01/17 1038  PROBNP 208 286* 180   HbA1C: No results for input(s): HGBA1C in the last 72 hours. CBG: Recent Labs  Lab 08/29/17 2144 08/30/17 0723 08/30/17 1142 08/30/17 1607 08/30/17 2029  GLUCAP 217* 100* 140* 179* 197*   Lipid Profile: No results for input(s): CHOL, HDL, LDLCALC, TRIG, CHOLHDL, LDLDIRECT in the last 72 hours. Thyroid Function  Tests: No results for input(s): TSH, T4TOTAL, FREET4, T3FREE, THYROIDAB in the last 72 hours. Anemia Panel: No results for input(s): VITAMINB12, FOLATE, FERRITIN, TIBC, IRON, RETICCTPCT in the last 72 hours.    Radiology Studies: I have reviewed all of the imaging during this hospital visit personally     Scheduled Meds: . allopurinol  100 mg Oral BID  . aspirin EC  81 mg Oral Daily  . atorvastatin  40 mg Oral Daily  . diclofenac sodium  2 g Topical QID  . gi cocktail  30 mL Oral Once  . heparin  5,000 Units Subcutaneous Q8H  . insulin aspart  0-9 Units Subcutaneous TID WC  . insulin detemir  15 Units Subcutaneous Q2200  . potassium chloride  40 mEq Oral Q4H  . predniSONE  40 mg Oral Q breakfast  . traMADol  50 mg Oral Once   Continuous Infusions: . cefTRIAXone (ROCEPHIN)  IV Stopped (08/31/17 8676)     LOS: 3 days        Mauricio Gerome Apley, MD [facility] Pager [phone]

## 2017-08-31 NOTE — Progress Notes (Signed)
At approximately 2200, RN called to patient's room.  He is c/o of sudden onset sharp pain to mid sternal chest.  He stated it began when he attempted to lay on his bed.  He also feels very short of breath.  He appears to be anxious.  VS - T-97,.9 B/P - 165/99 HR - 77 Resp - 18 and 99% on RA.  [facility] called and made aware.  New orders received.  0.25 xanax PO given @ 2227 and one Norco given at 2227.  Upon reassessment, patient is asleep in his bed.  Will continue to monitor patient.  Earleen Reaper RN-BC, [employer]

## 2017-08-31 NOTE — Progress Notes (Signed)
PT Cancellation Note  Patient Details Name: Marco [name] MRN: [id number] DOB: 1956-02-20   Cancelled Treatment:    Reason Eval/Treat Not Completed: Medical issues which prohibited therapy.  Pt had both chest pain and elevated troponin this AM and will ck later as time and pt allow.   Ramond Dial 08/31/2017, 9:44 AM   Mee Hives, PT MS Acute Rehab Dept. Number: [place]

## 2017-09-04 ENCOUNTER — Encounter: Payer: Self-pay | Admitting: Internal Medicine

## 2017-09-04 ENCOUNTER — Other Ambulatory Visit: Payer: Self-pay

## 2017-09-04 ENCOUNTER — Ambulatory Visit: Payer: Medicaid Other | Attending: Internal Medicine | Admitting: Internal Medicine

## 2017-09-04 VITALS — BP 150/82 | HR 82 | Temp 98.7°F | Resp 16 | Wt 224.1 lb

## 2017-09-04 DIAGNOSIS — E669 Obesity, unspecified: Secondary | ICD-10-CM | POA: Diagnosis not present

## 2017-09-04 DIAGNOSIS — Z6836 Body mass index (BMI) 36.0-36.9, adult: Secondary | ICD-10-CM | POA: Diagnosis not present

## 2017-09-04 DIAGNOSIS — N179 Acute kidney failure, unspecified: Secondary | ICD-10-CM | POA: Insufficient documentation

## 2017-09-04 DIAGNOSIS — Z801 Family history of malignant neoplasm of trachea, bronchus and lung: Secondary | ICD-10-CM | POA: Insufficient documentation

## 2017-09-04 DIAGNOSIS — E118 Type 2 diabetes mellitus with unspecified complications: Secondary | ICD-10-CM | POA: Diagnosis not present

## 2017-09-04 DIAGNOSIS — Z9989 Dependence on other enabling machines and devices: Secondary | ICD-10-CM | POA: Diagnosis not present

## 2017-09-04 DIAGNOSIS — M109 Gout, unspecified: Secondary | ICD-10-CM | POA: Diagnosis not present

## 2017-09-04 DIAGNOSIS — L409 Psoriasis, unspecified: Secondary | ICD-10-CM | POA: Insufficient documentation

## 2017-09-04 DIAGNOSIS — Z833 Family history of diabetes mellitus: Secondary | ICD-10-CM | POA: Insufficient documentation

## 2017-09-04 DIAGNOSIS — I4819 Other persistent atrial fibrillation: Secondary | ICD-10-CM

## 2017-09-04 DIAGNOSIS — I481 Persistent atrial fibrillation: Secondary | ICD-10-CM

## 2017-09-04 DIAGNOSIS — E785 Hyperlipidemia, unspecified: Secondary | ICD-10-CM | POA: Diagnosis not present

## 2017-09-04 DIAGNOSIS — I10 Essential (primary) hypertension: Secondary | ICD-10-CM | POA: Diagnosis not present

## 2017-09-04 DIAGNOSIS — G4733 Obstructive sleep apnea (adult) (pediatric): Secondary | ICD-10-CM | POA: Diagnosis not present

## 2017-09-04 DIAGNOSIS — Z825 Family history of asthma and other chronic lower respiratory diseases: Secondary | ICD-10-CM | POA: Diagnosis not present

## 2017-09-04 DIAGNOSIS — E1142 Type 2 diabetes mellitus with diabetic polyneuropathy: Secondary | ICD-10-CM | POA: Insufficient documentation

## 2017-09-04 DIAGNOSIS — Z794 Long term (current) use of insulin: Secondary | ICD-10-CM | POA: Diagnosis not present

## 2017-09-04 DIAGNOSIS — I130 Hypertensive heart and chronic kidney disease with heart failure and stage 1 through stage 4 chronic kidney disease, or unspecified chronic kidney disease: Secondary | ICD-10-CM | POA: Insufficient documentation

## 2017-09-04 DIAGNOSIS — I2510 Atherosclerotic heart disease of native coronary artery without angina pectoris: Secondary | ICD-10-CM | POA: Insufficient documentation

## 2017-09-04 DIAGNOSIS — I252 Old myocardial infarction: Secondary | ICD-10-CM | POA: Insufficient documentation

## 2017-09-04 DIAGNOSIS — G8929 Other chronic pain: Secondary | ICD-10-CM | POA: Insufficient documentation

## 2017-09-04 DIAGNOSIS — Z79899 Other long term (current) drug therapy: Secondary | ICD-10-CM | POA: Diagnosis not present

## 2017-09-04 DIAGNOSIS — F5101 Primary insomnia: Secondary | ICD-10-CM | POA: Diagnosis not present

## 2017-09-04 DIAGNOSIS — Z7982 Long term (current) use of aspirin: Secondary | ICD-10-CM | POA: Insufficient documentation

## 2017-09-04 DIAGNOSIS — I5042 Chronic combined systolic (congestive) and diastolic (congestive) heart failure: Secondary | ICD-10-CM | POA: Insufficient documentation

## 2017-09-04 DIAGNOSIS — N183 Chronic kidney disease, stage 3 (moderate): Secondary | ICD-10-CM

## 2017-09-04 DIAGNOSIS — Z8249 Family history of ischemic heart disease and other diseases of the circulatory system: Secondary | ICD-10-CM | POA: Diagnosis not present

## 2017-09-04 DIAGNOSIS — E11319 Type 2 diabetes mellitus with unspecified diabetic retinopathy without macular edema: Secondary | ICD-10-CM | POA: Insufficient documentation

## 2017-09-04 DIAGNOSIS — Z87891 Personal history of nicotine dependence: Secondary | ICD-10-CM | POA: Diagnosis not present

## 2017-09-04 DIAGNOSIS — E1122 Type 2 diabetes mellitus with diabetic chronic kidney disease: Secondary | ICD-10-CM | POA: Diagnosis not present

## 2017-09-04 LAB — GLUCOSE, POCT (MANUAL RESULT ENTRY): POC Glucose: 155 mg/dl — AB (ref 70–99)

## 2017-09-04 NOTE — Progress Notes (Signed)
Patient ID: Marco [name], male    DOB: 12-20-1955  MRN: [id number]  CC: Hospitalization Follow-up   Subjective: Marco [name] is a 62 y.o. male who presents for hosp f/u His concerns today include:  62 year old male with history of HTN,DM2 with neuropathy, CKD stage III, obesity, nonobstructive CAD, chronic combined CHF, OSA on CPAP, followed by rheumatology in [facility] for psoriasis/gout/nonspec arthritis. On narcotics through [facility] for chronic pain in chest/back/shoulders/neck  1.  Patient seen by me last week and was sent to the hospital for myoclonic jerks.  Hosp 1/17-21/2019.  Found to be in acute on chronic renal failure with hypo-kalemia.  Myoclonic jerks stopped with hydration and improvement in creatinine back to his baseline.  Hospital course complicated by acute flare of gout, urinary tract infection and acute anxiety attack. -I see noted in his discharge hx of A.fib.  Patient reports he was diagnosed with this in 2015 post MI. Was on Xarelto but developed recurrent drop in Hb requiring recurrent blood transfusion so Xarelto was discontinued.  He subsequently had successful ablation therapy  2.  HTN: BP elevated today.  Took meds already this a.m  3.  Continues to complain of problems with insomnia.  Problems falling asleep and staying asleep.  He was on Rozerem which we discontinued and placed him on Ambien CR.  He reports this is no better.  Reportedly took 1 of his wife's sleeping pills which worked well for him.  He does not recall the name. Has OSA.  He has had an ongoing issue of adjusting to CPAP.he states that his best sleep is when he does not wear the CPAP mask.  Feels his CPAP machine not working.  Humidity and mask registering red most of the time Patient Active Problem List   Diagnosis Date Noted  . ARF (acute renal failure) ([place]) 08/27/2017  . Renal failure (ARF), acute on chronic (HCC) 08/27/2017  . Myoclonic jerking 08/27/2017  . Dysphagia 01/09/2017    . Hyperlipidemia 12/23/2016  . Chronic combined systolic and diastolic CHF (congestive heart failure) ([place]) 09/09/2016  . Diabetes mellitus with complication ([place])   . Diabetic retinopathy ([place]) 07/22/2016  . Diabetic polyneuropathy associated with type 2 diabetes mellitus ([place]) 06/10/2016  . Myalgia and myositis 03/31/2016  . Dyspnea on exertion 02/09/2016  . Abdominal bloating 02/05/2016  . Chronic gouty arthritis 11/29/2015  . Ache in joint 11/27/2015  . LBP (low back pain) 11/27/2015  . Arthralgia of multiple joints 11/27/2015  . Psoriasis 11/27/2015  . Breast pain in male 11/14/2015  . Sinusitis, chronic 10/16/2015  . Insomnia 10/16/2015  . Adult BMI 30+ 10/04/2015  . New onset of headaches after age 89 09/20/2015  . OSA (obstructive sleep apnea) 07/12/2015  . Low serum testosterone level 05/18/2015  . Depression 05/16/2015  . Chronic pain   . CAD (coronary artery disease) 12/24/2014  . Persistent atrial fibrillation ([place]) 12/24/2014  . Essential hypertension 12/24/2014  . CKD (chronic kidney disease) stage 3, GFR 30-59 ml/min (HCC) 12/24/2014  . PUD (peptic ulcer disease) 12/24/2014  . DM2 (diabetes mellitus, type 2) ([place]) 12/24/2014     Current Outpatient Medications on File Prior to Visit  Medication Sig Dispense Refill  . ACCU-CHEK AVIVA PLUS test strip USE ONE STRIP TO CHECK BLOOD GLUCOSE 3 TIMES DAILY 100 each 12  . ACCU-CHEK FASTCLIX LANCETS MISC Use a directed to check blood sugar 3 times daily. 100 each 11  . acetaminophen (TYLENOL) 500 MG tablet Take 1 tablet (500  mg total) by mouth every 6 (six) hours as needed for moderate pain (or Fever >/= 101). 20 tablet 0  . allopurinol (ZYLOPRIM) 100 MG tablet Take 100 mg by mouth 2 (two) times daily.    [name] aspirin (EQ ASPIRIN ADULT LOW DOSE) 81 MG EC tablet Take 1 tablet (81 mg total) by mouth daily. Swallow whole. 90 tablet 3  . atorvastatin (LIPITOR) 40 MG tablet Take 1 tablet (40 mg total) by mouth daily. 90 tablet 3   . BD PEN NEEDLE NANO U/F 32G X 4 MM MISC USE ONE PEN NEEDLE THREE TIMES DAILY 100 each 11  . Blood Glucose Monitoring Suppl (ACCU-CHEK AVIVA PLUS) w/Device KIT 1 Device by Does not apply route 3 (three) times daily after meals. E11.9 1 kit 0  . Blood Pressure KIT 1 each by Does not apply route 2 (two) times daily. 1 each 0  . diclofenac sodium (VOLTAREN) 1 % GEL Apply 2 g topically 4 (four) times daily. Place over right knee for 5 days. 1 Tube 0  . furosemide (LASIX) 40 MG tablet Take 1 tablet by mouth every morning (Patient taking differently: Take 40 mg by mouth every morning. Take 1 tablet by mouth every morning) 90 tablet 3  . glucose blood (ACCU-CHEK AVIVA PLUS) test strip 1 each by Other route 3 (three) times daily. E11.9 100 each 12  . insulin aspart (NOVOLOG FLEXPEN) 100 UNIT/ML FlexPen Inject 14-24 Units into the skin 3 (three) times daily with meals. 15 mL 5  . Insulin Detemir (LEVEMIR FLEXTOUCH) 100 UNIT/ML Pen Inject 50 Units into the skin daily at 10 pm. 45 mL 3  . losartan (COZAAR) 50 MG tablet Take 50 mg by mouth daily.    . metFORMIN (GLUCOPHAGE-XR) 500 MG 24 hr tablet Take 2 tablets (1,000 mg total) by mouth 2 (two) times daily. 360 tablet 3  . predniSONE (DELTASONE) 20 MG tablet Take 1 tablet (20 mg total) by mouth daily with breakfast. Take on tablet daily for 5 days then half tablet daily for 5 days. 8 tablet 0  . predniSONE (DELTASONE) 5 MG tablet Take 5 mg by mouth daily.    . ranitidine (ZANTAC) 150 MG tablet Take 1 tablet (150 mg total) by mouth at bedtime. (Patient taking differently: Take 150 mg by mouth daily before breakfast. ) 90 tablet 3  . Secukinumab (COSENTYX) 150 MG/ML SOSY Inject 150 mg into the skin every 28 (twenty-eight) days.     [name] zolpidem (AMBIEN CR) 6.25 MG CR tablet Take 1 tablet (6.25 mg total) by mouth at bedtime as needed for sleep. 30 tablet 1  . pregabalin (LYRICA) 100 MG capsule Take 1 capsule (100 mg total) by mouth 3 (three) times daily. 90 capsule  5   No current facility-administered medications on file prior to visit.     No Known Allergies  Social History   Socioeconomic History  . Marital status: Married    Spouse name: Amethyst  . Number of children: 8  . Years of education: 37  . Highest education level: Not on file  Social Needs  . Financial resource strain: Not on file  . Food insecurity - worry: Not on file  . Food insecurity - inability: Not on file  . Transportation needs - medical: Not on file  . Transportation needs - non-medical: Not on file  Occupational History    Comment: disabled  Tobacco Use  . Smoking status: Former Smoker    Packs/day: 0.50    Years:  10.00    Pack years: 5.00    Types: Cigarettes    Last attempt to quit: 04/11/2008    Years since quitting: 9.4  . Smokeless tobacco: Never Used  Substance and Sexual Activity  . Alcohol use: No    Alcohol/week: 0.0 oz  . Drug use: No  . Sexual activity: Yes    Partners: Female    Birth control/protection: None  Other Topics Concern  . Not on file  Social History Narrative   Lives with wife. Does not work.  On disability.    Caffeine- coffee, 1/2 cup daily    Family History  Problem Relation Age of Onset  . Hypertension Mother   . Diabetes Mother   . COPD Mother   . Lung cancer Mother        Smoker   . Hypertension Father   . Diabetes Father   . Heart Problems Father   . COPD Father   . Colon cancer Neg Hx   . Stomach cancer Neg Hx     Past Surgical History:  Procedure Laterality Date  . CARDIAC CATHETERIZATION  05/2014   ablation for atrial fibrillation  . CARDIAC CATHETERIZATION N/A 11/16/2015   Procedure: Left Heart Cath and Coronary Angiography;  Surgeon: Leonie Man, MD;  Location: [facility];  Service: Cardiovascular;  Laterality: N/A;  . COLON RESECTION  09/2013   due to large, abnormal polpy. non cancerous per patient.   . COLON SURGERY  09/2014   colon resection     ROS: Review of Systems As stated  above PHYSICAL EXAM: BP (!) 150/82   Pulse 82   Temp 98.7 F (37.1 C) (Oral)   Resp 16   Wt 224 lb 1.6 oz (101.7 kg)   SpO2 99%   BMI 36.17 kg/m   Repeat 150/82  Physical Exam General appearance - alert, well appearing, and in no distress Mental status - alert, oriented to person, place, and time, normal mood, behavior, speech, dress, motor activity, and thought processes Chest - clear to auscultation, no wheezes, rales or rhonchi, symmetric air entry Heart - normal rate, regular rhythm, normal S1, S2, no murmurs, rubs, clicks or gallops Extremities: No lower extremity edema Results for orders placed or performed in visit on 09/04/17  Glucose (CBG)  Result Value Ref Range   POC Glucose 155 (A) 70 - 99 mg/dl     Chemistry      Component Value Date/Time   NA 136 08/31/2017 1554   NA 148 (H) 04/17/2017 1438   K 4.0 08/31/2017 1554   CL 100 (L) 08/31/2017 1554   CO2 21 (L) 08/31/2017 1554   BUN 13 08/31/2017 1554   BUN 23 04/17/2017 1438   CREATININE 1.56 (H) 08/31/2017 1554   CREATININE 1.42 (H) 09/09/2016 1037      Component Value Date/Time   CALCIUM 8.2 (L) 08/31/2017 1554   ALKPHOS 36 (L) 08/27/2017 2140   AST 36 08/27/2017 2140   ALT 20 08/27/2017 2140   BILITOT 0.7 08/27/2017 2140   BILITOT 0.3 04/17/2017 1221     Lab Results  Component Value Date   WBC 12.1 (H) 08/28/2017   HGB 11.3 (L) 08/28/2017   HCT 35.6 (L) 08/28/2017   MCV 94.4 08/28/2017   PLT 277 08/28/2017    ASSESSMENT AND PLAN: 1. Acute renal failure superimposed on stage 3 chronic kidney disease, unspecified acute renal failure type ([place]) Improved with hydration.  Will recheck BMP today.  2. Essential hypertension -  Basic metabolic panel  3. Primary insomnia 4. OSA on CPAP I think his insomnia is more related to his intolerance of the CPAP even though he states that he still does not sleep when he chooses not to use the CPAP  -He is on Ambien, Lyrica and chronic narcotics.  I am not sure  that I have much more to add.  I recommend that he speak with his sleep specialist Dr. Annamaria Boots to see if he has any other ideas. -I advised against using other peoples medications  5. Diabetes mellitus with complication (HCC) - Glucose (CBG)  Patient was given the opportunity to ask questions.  Patient verbalized understanding of the plan and was able to repeat key elements of the plan.   Orders Placed This Encounter  Procedures  . Basic metabolic panel  . Glucose (CBG)     Requested Prescriptions    No prescriptions requested or ordered in this encounter    No Follow-up on file.  Karle Plumber, MD, FACP

## 2017-09-04 NOTE — Progress Notes (Signed)
HFU- pt states he feels much better

## 2017-09-04 NOTE — Assessment & Plan Note (Signed)
Had ablation 2015-2016.  Recurrent anemia on Xarelto which was d/c for this reason

## 2017-09-05 LAB — BASIC METABOLIC PANEL
BUN/Creatinine Ratio: 12 (ref 10–24)
BUN: 19 mg/dL (ref 8–27)
CALCIUM: 9.5 mg/dL (ref 8.6–10.2)
CHLORIDE: 102 mmol/L (ref 96–106)
CO2: 24 mmol/L (ref 20–29)
Creatinine, Ser: 1.6 mg/dL — ABNORMAL HIGH (ref 0.76–1.27)
GFR calc non Af Amer: 45 mL/min/{1.73_m2} — ABNORMAL LOW (ref >59)
GFR, EST AFRICAN AMERICAN: 53 mL/min/{1.73_m2} — AB (ref >59)
GLUCOSE: 136 mg/dL — AB (ref 65–99)
POTASSIUM: 3.7 mmol/L (ref 3.5–5.2)
Sodium: 145 mmol/L — ABNORMAL HIGH (ref 134–144)

## 2017-09-16 ENCOUNTER — Encounter: Payer: Self-pay | Admitting: Internal Medicine

## 2017-09-16 NOTE — Progress Notes (Signed)
Received records from [facility].  Patient diagnosed with BPH with lower urinary tract symptoms and ED due to arterial insufficiency.  Patient placed on sildenafil 20 mg 1-2 tablets p.o. as needed and tamsulosin 0.4 mg 1 capsule daily.

## 2017-09-21 IMAGING — DX DG CHEST 2V
2 series · 2 of 2 positions shown · non-contrast
Comparison: 09/03/2016

CLINICAL DATA: Chest pain

EXAM:
CHEST  2 VIEW

[chest pa]
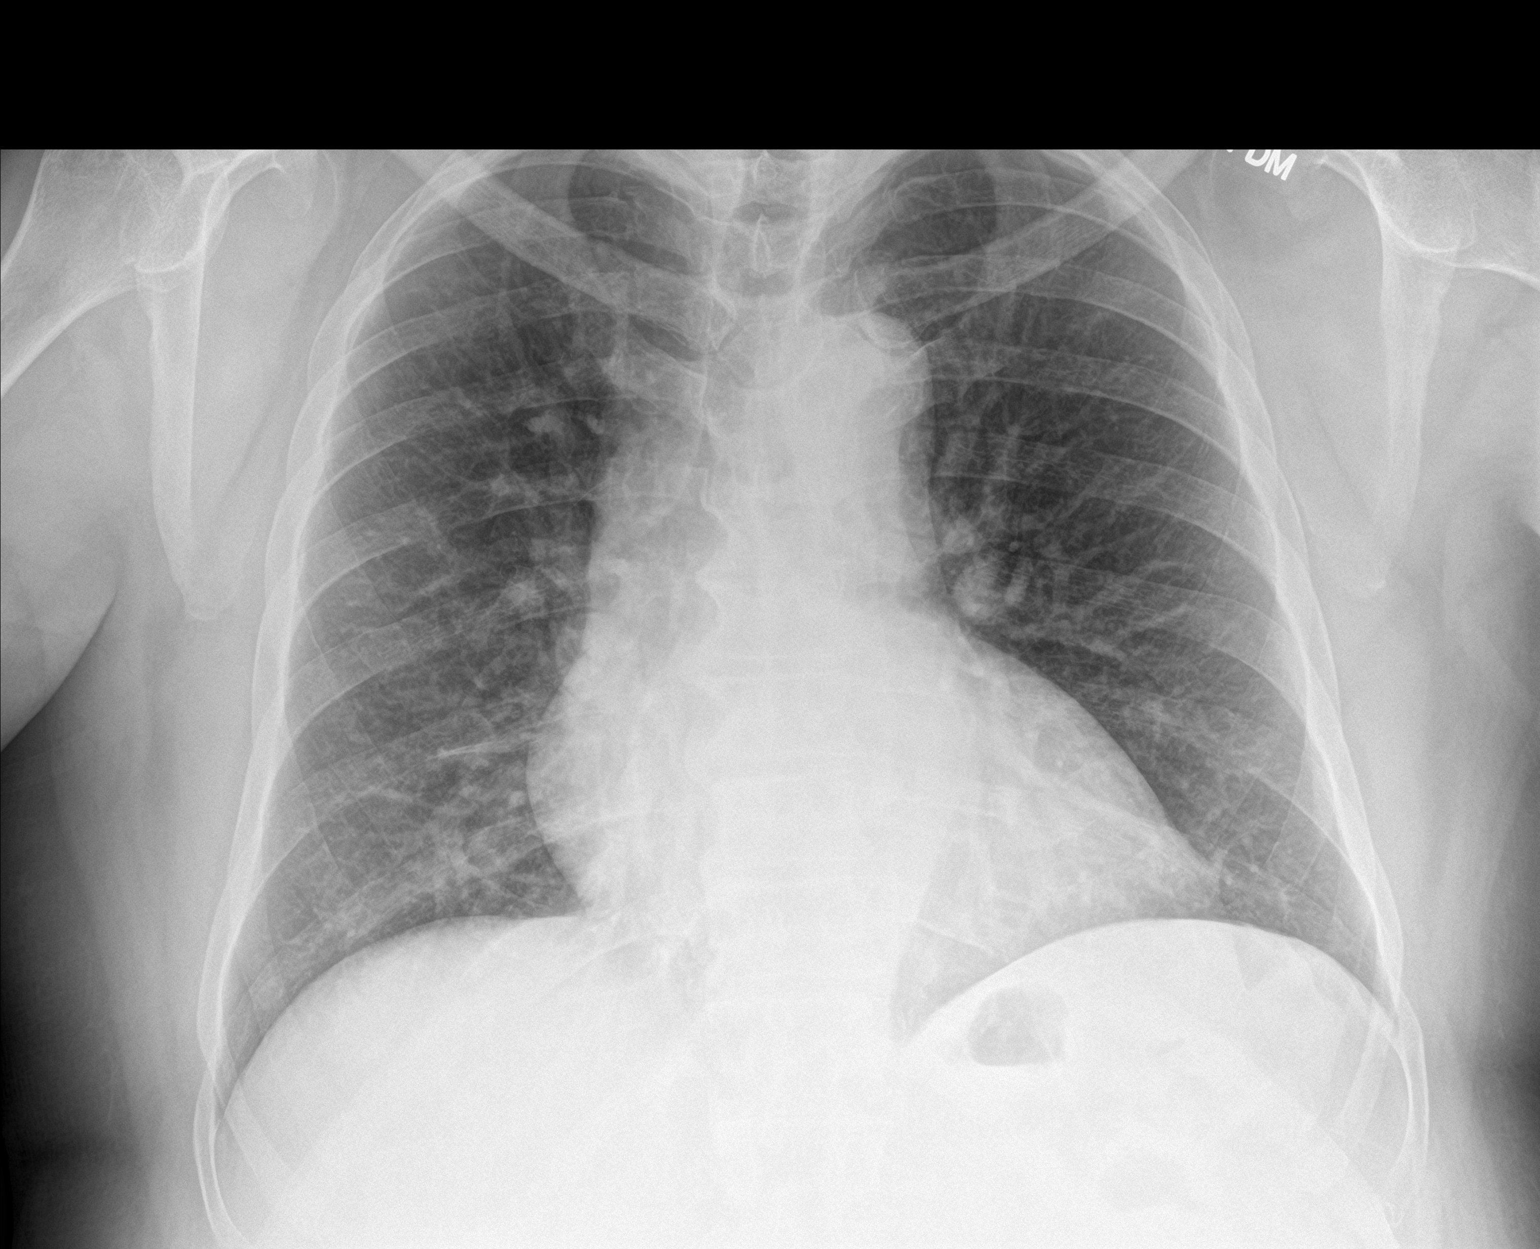

[chest lat]
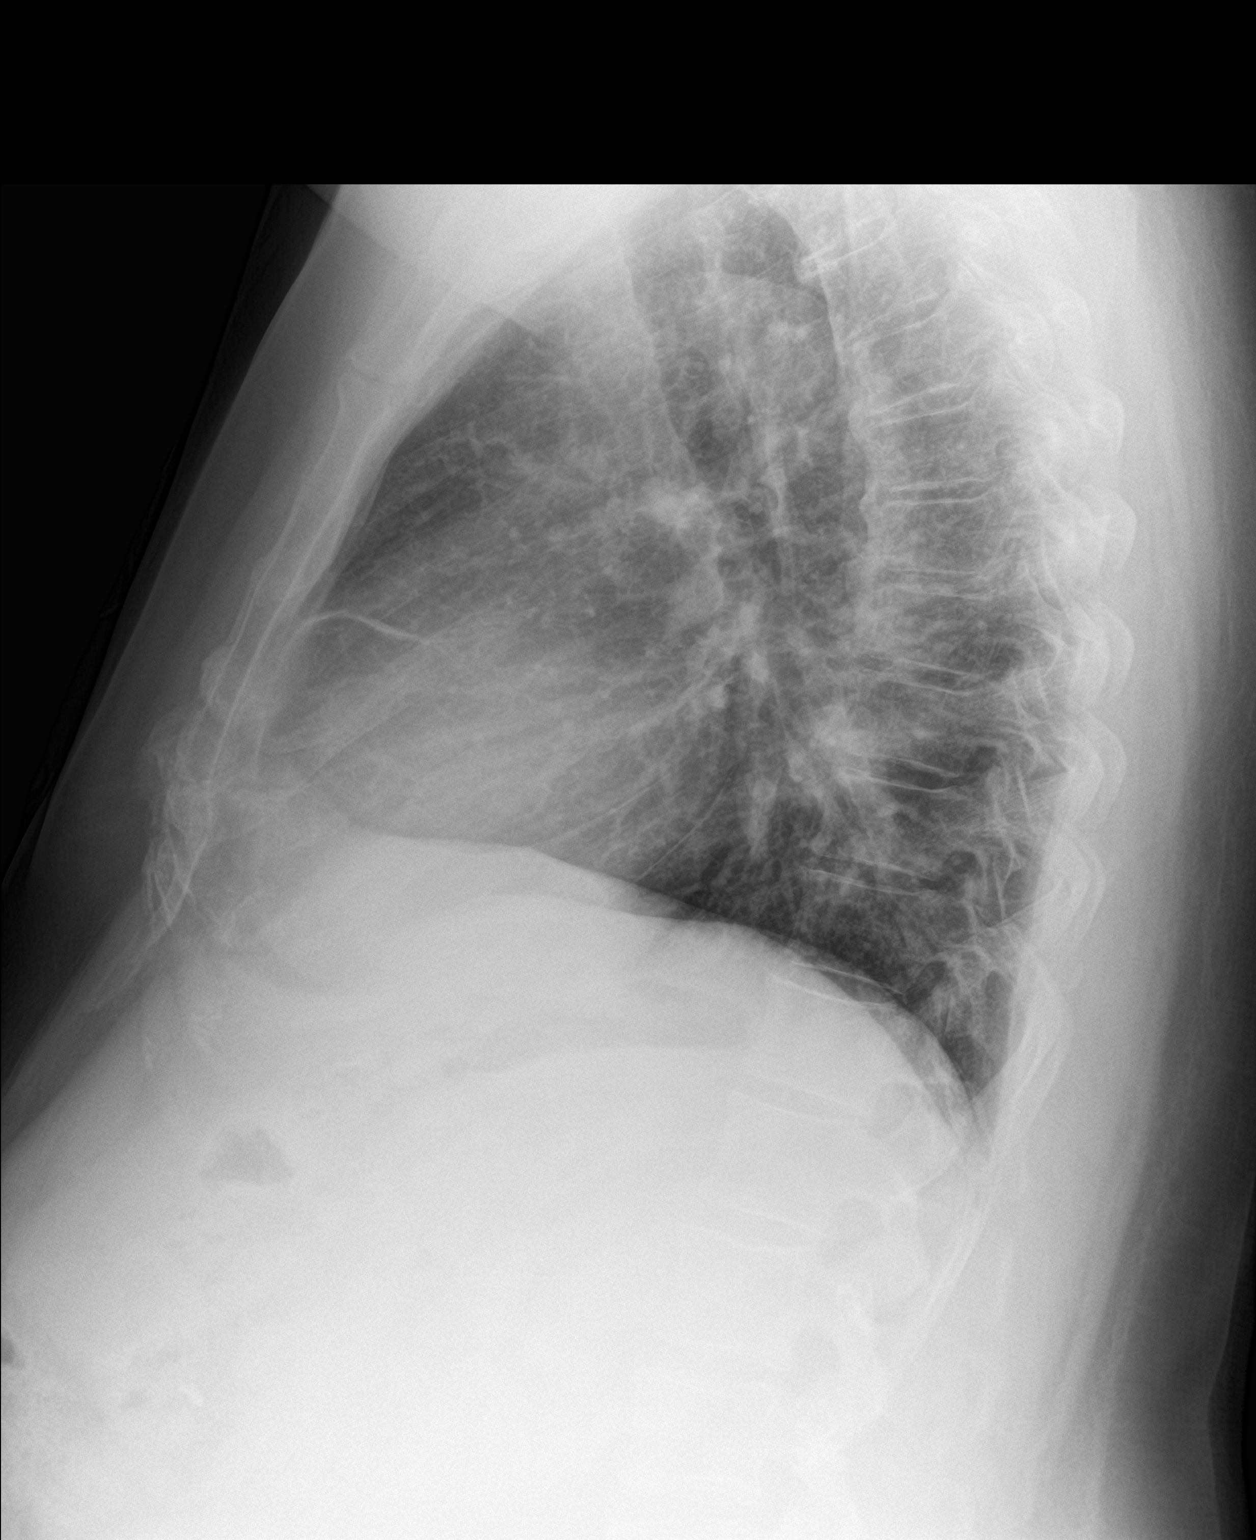

[2 of 2 positions shown; findings below may reference images not displayed]

FINDINGS: Cardiac shadow remains mildly enlarged. The degree of vascular
congestion has improved slightly in the interval from the prior
exam. No focal infiltrate or sizable effusion is seen. Minimal
atelectatic changes are noted in the right lung base.
IMPRESSION: Minimal right basilar atelectasis. Previously seen vascular
congestion has improved in the interval.

## 2017-09-24 ENCOUNTER — Other Ambulatory Visit: Payer: Self-pay | Admitting: Internal Medicine

## 2017-09-24 DIAGNOSIS — E119 Type 2 diabetes mellitus without complications: Secondary | ICD-10-CM

## 2017-09-27 ENCOUNTER — Encounter: Payer: Self-pay | Admitting: Internal Medicine

## 2017-09-28 ENCOUNTER — Ambulatory Visit: Payer: Medicaid Other | Admitting: Internal Medicine

## 2017-09-28 ENCOUNTER — Encounter: Payer: Self-pay | Admitting: Internal Medicine

## 2017-09-28 VITALS — BP 140/88 | HR 84 | Ht 66.0 in | Wt 219.0 lb

## 2017-09-28 DIAGNOSIS — G4733 Obstructive sleep apnea (adult) (pediatric): Secondary | ICD-10-CM

## 2017-09-28 DIAGNOSIS — F5101 Primary insomnia: Secondary | ICD-10-CM | POA: Diagnosis not present

## 2017-09-28 MED ORDER — CLONAZEPAM 0.5 MG PO TABS: ORAL_TABLET | ORAL | 5 refills | Status: DC

## 2017-09-28 NOTE — Patient Instructions (Signed)
Order- DME [facility]-  Continue VPAP Auto 16/ 13, PS 3, mask of choic,e humidifier, supplies, AirView  Script printed for Clonazepam 0.5 mg, # 90     1-3 for sleep as needed.  Take this about 1/2 hour before you intend to sleep.   Try this instead of ambien

## 2017-09-28 NOTE — Assessment & Plan Note (Signed)
Compliance has not been good, complicated by poor sleep habits/delayed sleep phase syndrome/circadian rhythm disorder He likes his new machine and indicates intention to use it more consistently.  We reviewed compliance goals and medication for sleep

## 2017-09-28 NOTE — Progress Notes (Signed)
HPI male former smoker followed for OSA/ Insomnia, dyspnea on exertion, complicated by chronic pain, anxiety, DM 2, history cardiac ablation, gout NPSG 37.8/hour on 06/14/2015, desaturation to 76%, body weight 223 pounds BiPAP titration 08/19/2015- to 20/16 Barium Swallow 02/07/2016-normal CXR 01/22/2016-NAD PFT 02/22/2016-minimal restriction, mild diffusion deficit. FVC 2.62/76%, FEV1 2.14/81%, ratio 0.82, DLCO 61%, TLC 79%, RV/TLC 135%. 6MWT- 02/22/16-97%, 98%, 98%. Stopped after only 39 m complaining of chest tightness, headache, shortness of breath, dizziness. BP and heart rate were unremarkable. Unattended Home Sleep Test-05/25/2016-AHI 36.9/hour, desaturation to 82%, body weight 212 pounds 05/15/2016-62 year old male former smoker followed for OSA, dyspnea on exertion, complicated by chronic pain, DM 2, history cardiac ablation BIPAP 20/26   ----------------------------------------------------------------------------  07/23/17- 62 year old male former smoker followed for OSA/ Insomnia, dyspnea on exertion, complicated by chronic pain, anxiety, DM 2, history cardiac ablation, gout, CHF, CKD BIPAP 16/ 12 [facility] ---OSA; DME [facility]. Pt wears BiPAP but has been having issues/concerns with pressure.  Not comfortable with his BiPAP "hard to get air in" with occasional shortness of breath. Download 17% compliance, AHI 9.2/hour Complains of easy dyspnea on exertion without acute event or significant cough or wheeze.  We reviewed PFT from 2017 showing moderate reduced diffusion and suggestion of air trapping. CXR 03/05/17- IMPRESSION: No active cardiopulmonary disease  09/28/17- 62 year old male former smoker followed for OSA/ Insomnia, dyspnea on exertion, complicated by chronic pain, anxiety, DM 2, history cardiac ablation, gout, CHF, CKD VPAP auto 16/13, PS3 [facility]   Had replaced old machine. ----OSA: DME: [facility]. Pt states he wears VPAP nightly-DL attached. No new supplies. Using VPAP more  regularly last few nights.  Says main problem is he sleeps poorly at night.  Somehow, because he has to watch over his grandchildren, his usual bedtime is around 3 AM.  Bedroom is darkened against morning sun.  Ambien only seems to work for him for a couple of hours.  He considers his primary problem insufficient sleep. Download 37% compliance, AHI 5.5/hour  ROS-see HPI    "+" = pos Constitutional:    weight loss, night sweats, fevers, chills, fatigue, lassitude. HEENT:    headaches, + difficulty swallowing, tooth/dental problems, sore throat,       sneezing, itching, ear ache, nasal congestion, post nasal drip, snoring CV:     chest pain, orthopnea, PND, + swelling in lower extremities, anasarca,                                                          dizziness, palpitations Resp:  + shortness of breath with exertion or at rest.                productive cough,   non-productive cough, coughing up of blood.              change in color of mucus.  wheezing.   Skin:    rash or lesions. GI:  No-   heartburn, indigestion, + abdominal pain, nausea, vomiting, diarrhea,                 change in bowel habits, loss of appetite GU: dysuria, change in color of urine, no urgency or frequency.   flank pain. MS:   + joint pain, stiffness, decreased range of motion, back pain. Neuro-     nothing unusual Psych:  change in mood or affect.  depression or anxiety.   memory loss.  OBJ- Physical Exam General- Alert, Oriented, Affect-appropriate, Distress- none acute, + overweight  Skin- rash-none, lesions- none, excoriation- none Lymphadenopathy- none Head- atraumatic            Eyes- Gross vision intact, PERRLA, conjunctivae and secretions clear            Ears- Hearing, canals-normal            Nose- Clear, no-Septal dev, mucus, polyps, erosion, perforation             Throat- Mallampati IV , mucosa clear-not particularly dry , drainage- none, tonsils- atrophic,                 +many missing teeth Neck-  flexible , trachea midline, no stridor , thyroid nl, carotid no bruit Chest - symmetrical excursion , unlabored           Heart/CV- RRR , no murmur , no gallop  , no rub, nl s1 s2                           - JVD- none , edema- none, stasis changes- none, varices- none           Lung- clear to P&A, wheeze- none, cough- none , dullness-none, rub- none           Chest wall-  Abd-  Br/ Gen/ Rectal- Not done, not indicated Extrem- cyanosis- none, clubbing, none, atrophy- none, strength- nl Neuro- grossly intact to observation

## 2017-09-28 NOTE — Assessment & Plan Note (Signed)
Difficulty initiating and maintaining sleep.  Usual bedtime around 3 AM.  Ambien only sustained sleep a couple of hours. I suspect combination of poor sleep hygiene, delayed sleep phase syndrome, circadian rhythm disorder. Plan-try replacing Ambien with clonazepam up to 1.5 mg for sleep as needed.  We discussed long half-life.  Discussed sleep hygiene.

## 2017-10-05 ENCOUNTER — Telehealth: Payer: Self-pay | Admitting: Internal Medicine

## 2017-10-05 NOTE — Telephone Encounter (Signed)
CY please advise, clonazepam was prescribed to patient on visit at 2.18.19. Pharmacy states there is an interaction with this medication and the patients hydrocodone 7.5/325. The hydrocodone is prescribed by Dr. Tarry Kos and he gets a 30 day supply of 90 pills to take 3 times a day. Pharmacy states the interaction between the two medications is increased respiratory depression. Patient is asking for a different medication other than the clonazepam. Please advise.  Current Outpatient Medications on File Prior to Visit  Medication Sig Dispense Refill  . ACCU-CHEK AVIVA PLUS test strip USE ONE STRIP TO CHECK BLOOD GLUCOSE 3 TIMES DAILY 100 each 12  . ACCU-CHEK FASTCLIX LANCETS MISC Use a directed to check blood sugar 3 times daily. 100 each 11  . allopurinol (ZYLOPRIM) 100 MG tablet Take 100 mg by mouth 2 (two) times daily.    [name] aspirin (EQ ASPIRIN ADULT LOW DOSE) 81 MG EC tablet Take 1 tablet (81 mg total) by mouth daily. Swallow whole. 90 tablet 3  . atorvastatin (LIPITOR) 40 MG tablet Take 1 tablet (40 mg total) by mouth daily. 90 tablet 3  . BD PEN NEEDLE NANO U/F 32G X 4 MM MISC USE ONE  THREE TIMES DAILY 100 each 11  . Blood Glucose Monitoring Suppl (ACCU-CHEK AVIVA PLUS) w/Device KIT 1 Device by Does not apply route 3 (three) times daily after meals. E11.9 1 kit 0  . Blood Pressure KIT 1 each by Does not apply route 2 (two) times daily. 1 each 0  . clonazePAM (KLONOPIN) 0.5 MG tablet 1-3 tabs for sleep as needed 90 tablet 5  . diclofenac sodium (VOLTAREN) 1 % GEL Apply 2 g topically 4 (four) times daily. Place over right knee for 5 days. 1 Tube 0  . furosemide (LASIX) 40 MG tablet Take 1 tablet by mouth every morning (Patient taking differently: Take 40 mg by mouth every morning. Take 1 tablet by mouth every morning) 90 tablet 3  . glucose blood (ACCU-CHEK AVIVA PLUS) test strip 1 each by Other route 3 (three) times daily. E11.9 100 each 12  . insulin aspart (NOVOLOG FLEXPEN) 100  UNIT/ML FlexPen Inject 14-24 Units into the skin 3 (three) times daily with meals. 15 mL 5  . Insulin Detemir (LEVEMIR FLEXTOUCH) 100 UNIT/ML Pen Inject 50 Units into the skin daily at 10 pm. 45 mL 3  . losartan (COZAAR) 50 MG tablet Take 50 mg by mouth daily.    . metFORMIN (GLUCOPHAGE-XR) 500 MG 24 hr tablet Take 2 tablets (1,000 mg total) by mouth 2 (two) times daily. 360 tablet 3  . predniSONE (DELTASONE) 20 MG tablet Take 1 tablet (20 mg total) by mouth daily with breakfast. Take on tablet daily for 5 days then half tablet daily for 5 days. 8 tablet 0  . predniSONE (DELTASONE) 5 MG tablet Take 5 mg by mouth daily.    . pregabalin (LYRICA) 100 MG capsule Take 1 capsule (100 mg total) by mouth 3 (three) times daily. 90 capsule 5  . ranitidine (ZANTAC) 150 MG tablet Take 1 tablet (150 mg total) by mouth at bedtime. (Patient taking differently: Take 150 mg by mouth daily before breakfast. ) 90 tablet 3  . Secukinumab (COSENTYX) 150 MG/ML SOSY Inject 150 mg into the skin every 28 (twenty-eight) days.     [name] zolpidem (AMBIEN CR) 6.25 MG CR tablet Take 1 tablet (6.25 mg total) by mouth at bedtime as needed for sleep. 30 tablet 1   No current facility-administered medications  on file prior to visit.    No Known Allergies

## 2017-10-05 NOTE — Telephone Encounter (Signed)
All sleep medicines will have some potential to depress breathing, just as they depress conciousnes.   Instead of clonazepam, suggest he call [facility]  [phone]  These are clinical psychologists trained in techniques that work as well as drugs for many people with insomnia.

## 2017-10-05 NOTE — Telephone Encounter (Signed)
Called patient he is aware nothing further needed.

## 2017-10-06 ENCOUNTER — Telehealth: Payer: Self-pay | Admitting: Internal Medicine

## 2017-10-06 NOTE — Telephone Encounter (Signed)
Patient called requesting Zolpidem refilled and sent to [facility] on [address]

## 2017-10-06 NOTE — Telephone Encounter (Signed)
This is a controlled substance. Will forward to PCP

## 2017-10-08 ENCOUNTER — Telehealth: Payer: Self-pay | Admitting: Internal Medicine

## 2017-10-08 NOTE — Telephone Encounter (Signed)
Pt contacted the office and Rolm Gala spoke with him I informed Marco [name] of the message that Dr. Wynetta Emery sent me

## 2017-10-08 NOTE — Telephone Encounter (Signed)
Pt aware of PCP response and will contact Dr.Young's office to request his medications switched

## 2017-10-08 NOTE — Telephone Encounter (Signed)
Called and spoke with patient, he states that he was confused about the Cognitive behavioral therapy that CY told him to go to. I referred back to the 2.25.19 note and advised the patient that the medication that CY prescribed (klonopin) is to help him with sleep. Patient states that he did not want to take this medication due to the pharmacist saying it would cause breathing issues. Per CY note I advised the patient that all sleep medications would depress the breathing in some way, and since he was not wanting to take this medication CY has asked that he go to the [facility] so that they can further evaluate him and treat him for the sleeping issues. Patient verbalized understanding and was ok with that.   Patient then asked that we refill the Ambien that his PCP has been prescribing him. I advised him that he would need to get the refill from that office. Nothing further needed at this time.

## 2017-10-08 NOTE — Telephone Encounter (Signed)
Patient called and stated that Dr. Bertrum Sol office took the medication off the chart because he isnt taken the medication and would like you to send the medication to [place]. Please fu with patient

## 2017-10-08 NOTE — Telephone Encounter (Signed)
Pt contacted the office again and spoke with [place]

## 2017-10-09 ENCOUNTER — Telehealth: Payer: Self-pay | Admitting: Pharmacist

## 2017-10-09 ENCOUNTER — Telehealth: Payer: Self-pay | Admitting: Internal Medicine

## 2017-10-09 DIAGNOSIS — G4700 Insomnia, unspecified: Secondary | ICD-10-CM

## 2017-10-09 NOTE — Telephone Encounter (Signed)
I tried to reach pt this a.m and this afternoon.  I did not leave a message.  I see that pt has filled the rxn for Clonazepam #90 tabs in the system. I would like to speak with him prior to doing any refills on the Ambien.

## 2017-10-09 NOTE — Telephone Encounter (Signed)
Pt called back but provider was unavailable, Please follow up with pt

## 2017-10-09 NOTE — Telephone Encounter (Signed)
Received fax from [facility] on [address], requesting refill of zolpidem. Will forward to PCP

## 2017-10-09 NOTE — Telephone Encounter (Signed)
Pt stated pcp tried calling him twice today and was unable to get to the phone pt is requesting a call back from pcp. I informed pt that provider is in clinic and clinic is not over till 530. I did inform pt that if provider finds time in between pt's and or after clinic she will try to reach out. Pt states he understands

## 2017-10-13 MED ORDER — ZOLPIDEM TARTRATE ER 6.25 MG PO TBCR: 6.2500 mg | EXTENDED_RELEASE_TABLET | Freq: Every evening | ORAL | 1 refills | Status: DC | PRN

## 2017-10-13 NOTE — Telephone Encounter (Signed)
Contacted pt and made aware that [name] is ready for pick up lvm

## 2017-10-13 NOTE — Telephone Encounter (Signed)
Phone call placed to patient 10/12/2017.  I advised patient that I was hesitant to refill the Ambien because I see that he was given clonazepam by sleep specialist.  Patient reports that he never filled the prescription because he was told by the pharmacist that it can suppress his breathing.  Patient was unable to talk any further stating that he was in the middle of performing a funeral.  I did call his pharmacy at [facility].  They confirmed that patient never fill the prescription even though it was showing up on the [place] as being filled.  The pharmacist states that he will call and try to get that removed off of the reporting databank. I will send a refill on the Ambien until I see patient in follow-up.

## 2017-10-15 ENCOUNTER — Ambulatory Visit: Payer: Medicaid Other | Attending: Internal Medicine | Admitting: Internal Medicine

## 2017-10-15 ENCOUNTER — Encounter: Payer: Self-pay | Admitting: Internal Medicine

## 2017-10-15 VITALS — BP 168/90 | HR 77 | Temp 98.9°F | Resp 16 | Ht 66.0 in | Wt 224.8 lb

## 2017-10-15 DIAGNOSIS — Z7982 Long term (current) use of aspirin: Secondary | ICD-10-CM | POA: Insufficient documentation

## 2017-10-15 DIAGNOSIS — Z79899 Other long term (current) drug therapy: Secondary | ICD-10-CM | POA: Diagnosis not present

## 2017-10-15 DIAGNOSIS — J069 Acute upper respiratory infection, unspecified: Secondary | ICD-10-CM | POA: Diagnosis not present

## 2017-10-15 DIAGNOSIS — E1165 Type 2 diabetes mellitus with hyperglycemia: Secondary | ICD-10-CM

## 2017-10-15 DIAGNOSIS — L309 Dermatitis, unspecified: Secondary | ICD-10-CM | POA: Diagnosis not present

## 2017-10-15 DIAGNOSIS — Z801 Family history of malignant neoplasm of trachea, bronchus and lung: Secondary | ICD-10-CM | POA: Insufficient documentation

## 2017-10-15 DIAGNOSIS — Z833 Family history of diabetes mellitus: Secondary | ICD-10-CM | POA: Insufficient documentation

## 2017-10-15 DIAGNOSIS — I10 Essential (primary) hypertension: Secondary | ICD-10-CM | POA: Diagnosis not present

## 2017-10-15 DIAGNOSIS — E118 Type 2 diabetes mellitus with unspecified complications: Secondary | ICD-10-CM | POA: Diagnosis not present

## 2017-10-15 DIAGNOSIS — Z794 Long term (current) use of insulin: Secondary | ICD-10-CM | POA: Diagnosis not present

## 2017-10-15 DIAGNOSIS — IMO0002 Reserved for concepts with insufficient information to code with codable children: Secondary | ICD-10-CM

## 2017-10-15 DIAGNOSIS — Z8249 Family history of ischemic heart disease and other diseases of the circulatory system: Secondary | ICD-10-CM | POA: Diagnosis not present

## 2017-10-15 DIAGNOSIS — G4733 Obstructive sleep apnea (adult) (pediatric): Secondary | ICD-10-CM | POA: Insufficient documentation

## 2017-10-15 DIAGNOSIS — G4700 Insomnia, unspecified: Secondary | ICD-10-CM | POA: Diagnosis not present

## 2017-10-15 DIAGNOSIS — I130 Hypertensive heart and chronic kidney disease with heart failure and stage 1 through stage 4 chronic kidney disease, or unspecified chronic kidney disease: Secondary | ICD-10-CM | POA: Diagnosis present

## 2017-10-15 DIAGNOSIS — Z9889 Other specified postprocedural states: Secondary | ICD-10-CM | POA: Diagnosis not present

## 2017-10-15 DIAGNOSIS — E1142 Type 2 diabetes mellitus with diabetic polyneuropathy: Secondary | ICD-10-CM | POA: Insufficient documentation

## 2017-10-15 DIAGNOSIS — E1122 Type 2 diabetes mellitus with diabetic chronic kidney disease: Secondary | ICD-10-CM | POA: Diagnosis not present

## 2017-10-15 DIAGNOSIS — Z87891 Personal history of nicotine dependence: Secondary | ICD-10-CM | POA: Diagnosis not present

## 2017-10-15 DIAGNOSIS — N179 Acute kidney failure, unspecified: Secondary | ICD-10-CM | POA: Diagnosis not present

## 2017-10-15 DIAGNOSIS — N183 Chronic kidney disease, stage 3 (moderate): Secondary | ICD-10-CM | POA: Insufficient documentation

## 2017-10-15 LAB — GLUCOSE, POCT (MANUAL RESULT ENTRY): POC Glucose: 217 mg/dl — AB (ref 70–99)

## 2017-10-15 MED ORDER — GUAIFENESIN ER 600 MG PO TB12: 600.0000 mg | ORAL_TABLET | Freq: Two times a day (BID) | ORAL | 0 refills | Status: DC | PRN

## 2017-10-15 MED ORDER — LOSARTAN POTASSIUM 100 MG PO TABS: 100.0000 mg | ORAL_TABLET | Freq: Every day | ORAL | 4 refills | Status: DC

## 2017-10-15 NOTE — Progress Notes (Signed)
Patient ID: Marco [name], male    DOB: 06/14/1956  MRN: [id number]  CC: Diabetes and Hypertension   Subjective: Marco [name] is a 62 y.o. male who presents for chronic ds management. His concerns today include:  62 year old male with history of HTN,DM2 with neuropathy, CKD stage III, obesity, nonobstructive CAD, chronic combined CHF, OSA on CPAP, followed by rheumatology in [facility] for psoriasis/gout/nonspec arthritis. On narcotics through [facility] for chronic pain in chest/back/shoulders/neck  1.  Insomnia: pt declined Clonazepam prescribed by his sleep specialist.  He is afraid it will suppress his breathing.  Decide to stay on the Ambien.  He picked up the prescription today that I had written for him this week.  2.  HTN: No device to check BP -compliant with Losartan/Lasix.  Cozaar recorded as 50 mg on his med list but patient states that he is actually on 100 mg daily -limits salt  3.  DM:  BS have been higher over past 1 wk 200-400s; usually 90-130. On Prednisone 10 mg for RA chronically. Has been taking Robitussin over the past wk due to chest congestion. Would like something to help bring up the phlegm -no fever -BS this a.m was 150. taking Levemir 50, Novlog SS, Metformin.  Still noticed metformin tablets in his stools at times  4.  Has 3 red spots on chest x few wks Does not itch.  Area a little sore  5.  CKD 3:  Has been stable.  Has never seen neph in past.  Patient Active Problem List   Diagnosis Date Noted  . Renal failure (ARF), acute on chronic (HCC) 08/27/2017  . Dysphagia 01/09/2017  . Hyperlipidemia 12/23/2016  . Chronic combined systolic and diastolic CHF (congestive heart failure) ([place]) 09/09/2016  . Diabetes mellitus with complication ([place])   . Diabetic retinopathy ([place]) 07/22/2016  . Diabetic polyneuropathy associated with type 2 diabetes mellitus ([place]) 06/10/2016  . Myalgia and myositis 03/31/2016  . Dyspnea on exertion 02/09/2016  . Chronic  gouty arthritis 11/29/2015  . Ache in joint 11/27/2015  . LBP (low back pain) 11/27/2015  . Arthralgia of multiple joints 11/27/2015  . Psoriasis 11/27/2015  . Breast pain in male 11/14/2015  . Sinusitis, chronic 10/16/2015  . Insomnia 10/16/2015  . Adult BMI 30+ 10/04/2015  . New onset of headaches after age 43 09/20/2015  . OSA (obstructive sleep apnea) 07/12/2015  . Low serum testosterone level 05/18/2015  . Depression 05/16/2015  . Chronic pain   . CAD (coronary artery disease) 12/24/2014  . Atrial fibrillation ([place]) 12/24/2014  . Essential hypertension 12/24/2014  . CKD (chronic kidney disease) stage 3, GFR 30-59 ml/min (HCC) 12/24/2014  . PUD (peptic ulcer disease) 12/24/2014     Current Outpatient Medications on File Prior to Visit  Medication Sig Dispense Refill  . ACCU-CHEK AVIVA PLUS test strip USE ONE STRIP TO CHECK BLOOD GLUCOSE 3 TIMES DAILY 100 each 12  . ACCU-CHEK FASTCLIX LANCETS MISC Use a directed to check blood sugar 3 times daily. 100 each 11  . allopurinol (ZYLOPRIM) 100 MG tablet Take 100 mg by mouth 2 (two) times daily.    [name] aspirin (EQ ASPIRIN ADULT LOW DOSE) 81 MG EC tablet Take 1 tablet (81 mg total) by mouth daily. Swallow whole. 90 tablet 3  . atorvastatin (LIPITOR) 40 MG tablet Take 1 tablet (40 mg total) by mouth daily. 90 tablet 3  . BD PEN NEEDLE NANO U/F 32G X 4 MM MISC USE ONE  THREE TIMES  DAILY 100 each 11  . Blood Glucose Monitoring Suppl (ACCU-CHEK AVIVA PLUS) w/Device KIT 1 Device by Does not apply route 3 (three) times daily after meals. E11.9 1 kit 0  . Blood Pressure KIT 1 each by Does not apply route 2 (two) times daily. 1 each 0  . diclofenac sodium (VOLTAREN) 1 % GEL Apply 2 g topically 4 (four) times daily. Place over right knee for 5 days. 1 Tube 0  . furosemide (LASIX) 40 MG tablet Take 1 tablet by mouth every morning (Patient taking differently: Take 40 mg by mouth every morning. Take 1 tablet by mouth every morning) 90 tablet 3  .  glucose blood (ACCU-CHEK AVIVA PLUS) test strip 1 each by Other route 3 (three) times daily. E11.9 100 each 12  . insulin aspart (NOVOLOG FLEXPEN) 100 UNIT/ML FlexPen Inject 14-24 Units into the skin 3 (three) times daily with meals. 15 mL 5  . Insulin Detemir (LEVEMIR FLEXTOUCH) 100 UNIT/ML Pen Inject 50 Units into the skin daily at 10 pm. 45 mL 3  . metFORMIN (GLUCOPHAGE-XR) 500 MG 24 hr tablet Take 2 tablets (1,000 mg total) by mouth 2 (two) times daily. 360 tablet 3  . predniSONE (DELTASONE) 5 MG tablet Take 5 mg by mouth daily.    . pregabalin (LYRICA) 100 MG capsule Take 1 capsule (100 mg total) by mouth 3 (three) times daily. 90 capsule 5  . ranitidine (ZANTAC) 150 MG tablet Take 1 tablet (150 mg total) by mouth at bedtime. (Patient taking differently: Take 150 mg by mouth daily before breakfast. ) 90 tablet 3  . Secukinumab (COSENTYX) 150 MG/ML SOSY Inject 150 mg into the skin every 28 (twenty-eight) days.     [name] zolpidem (AMBIEN CR) 6.25 MG CR tablet Take 1 tablet (6.25 mg total) by mouth at bedtime as needed for sleep. 30 tablet 1   No current facility-administered medications on file prior to visit.     No Known Allergies  Social History   Socioeconomic History  . Marital status: Married    Spouse name: Amethyst  . Number of children: 8  . Years of education: 37  . Highest education level: Not on file  Social Needs  . Financial resource strain: Not on file  . Food insecurity - worry: Not on file  . Food insecurity - inability: Not on file  . Transportation needs - medical: Not on file  . Transportation needs - non-medical: Not on file  Occupational History    Comment: disabled  Tobacco Use  . Smoking status: Former Smoker    Packs/day: 0.50    Years: 10.00    Pack years: 5.00    Types: Cigarettes    Last attempt to quit: 04/11/2008    Years since quitting: 9.5  . Smokeless tobacco: Never Used  Substance and Sexual Activity  . Alcohol use: No    Alcohol/week: 0.0 oz    . Drug use: No  . Sexual activity: Yes    Partners: Female    Birth control/protection: None  Other Topics Concern  . Not on file  Social History Narrative   Lives with wife. Does not work.  On disability.    Caffeine- coffee, 1/2 cup daily    Family History  Problem Relation Age of Onset  . Hypertension Mother   . Diabetes Mother   . COPD Mother   . Lung cancer Mother        Smoker   . Hypertension Father   . Diabetes  Father   . Heart Problems Father   . COPD Father   . Colon cancer Neg Hx   . Stomach cancer Neg Hx     Past Surgical History:  Procedure Laterality Date  . CARDIAC CATHETERIZATION  05/2014   ablation for atrial fibrillation  . CARDIAC CATHETERIZATION N/A 11/16/2015   Procedure: Left Heart Cath and Coronary Angiography;  Surgeon: Leonie Man, MD;  Location: [place] [facility];  Service: Cardiovascular;  Laterality: N/A;  . COLON RESECTION  09/2013   due to large, abnormal polpy. non cancerous per patient.   . COLON SURGERY  09/2014   colon resection     ROS: Review of Systems Negative except as stated above PHYSICAL EXAM: BP (!) 168/90   Pulse 77   Temp 98.9 F (37.2 C) (Oral)   Resp 16   Ht _0  (1.676 m)   Wt 224 lb 12.8 oz (102 kg)   SpO2 95%   BMI 36.28 kg/m   Wt Readings from Last 3 Encounters:  10/15/17 224 lb 12.8 oz (102 kg)  09/28/17 219 lb (99.3 kg)  09/04/17 224 lb 1.6 oz (101.7 kg)    Physical Exam  General appearance - alert, well appearing, and in no distress Mental status - alert, oriented to person, place, and time, normal mood, behavior, speech, dress, motor activity, and thought processes Chest - clear to auscultation, no wheezes, rales or rhonchi, symmetric air entry Heart - normal rate, regular rhythm, normal S1, S2, no murmurs, rubs, clicks or gallops Extremities -no lower extremity edema Skin: 3 small less than 1 cm hypopigmented spots over the upper sternum  BS 217 Lab Results  Component Value Date   HGBA1C  7.9 08/27/2017    ASSESSMENT AND PLAN: 1. Uncontrolled type 2 diabetes mellitus with complication, with long-term current use of insulin (HCC) Advised to stop the Robitussin cough syrup.  Continue to monitor blood sugars and write them down.  I will have him follow-up in 1 week for recheck.  If blood sugars are still high we will adjust his insulin - POCT glucose (manual entry)  2. Essential hypertension Not at goal today.  Robitussin that he was taking over-the-counter has a decongestant in it which may be contributing to his elevated blood pressure today.  We will have him follow-up in 1 week for blood pressure recheck off of this medication  3. Insomnia, unspecified type Patient back on Ambien.  4. Upper respiratory tract infection, unspecified type - guaiFENesin (MUCINEX) 600 MG 12 hr tablet; Take 1 tablet (600 mg total) by mouth 2 (two) times daily as needed.  Dispense: 12 tablet; Refill: 0  5. Dermatitis Observe for now.  Patient was given the opportunity to ask questions.  Patient verbalized understanding of the plan and was able to repeat key elements of the plan.   Orders Placed This Encounter  Procedures  . POCT glucose (manual entry)     Requested Prescriptions   Signed Prescriptions Disp Refills  . guaiFENesin (MUCINEX) 600 MG 12 hr tablet 12 tablet 0    Sig: Take 1 tablet (600 mg total) by mouth 2 (two) times daily as needed.  [name] losartan (COZAAR) 100 MG tablet 30 tablet 4    Sig: Take 1 tablet (100 mg total) by mouth daily.    Return in about 1 week (around 10/22/2017) for BP and blood sugar check.  Karle Plumber, MD, FACP

## 2017-10-15 NOTE — Patient Instructions (Addendum)
Stop Robitussin CSF. Please record blood sugars and bring in log on next visit.  If you are able to check blood pressure, please bring in readings on next visit.

## 2017-10-29 ENCOUNTER — Ambulatory Visit: Payer: Medicaid Other | Attending: Internal Medicine | Admitting: Internal Medicine

## 2017-10-29 ENCOUNTER — Encounter: Payer: Self-pay | Admitting: Internal Medicine

## 2017-10-29 VITALS — BP 116/78 | HR 85 | Temp 98.4°F | Resp 16 | Wt 226.2 lb

## 2017-10-29 DIAGNOSIS — I2510 Atherosclerotic heart disease of native coronary artery without angina pectoris: Secondary | ICD-10-CM | POA: Insufficient documentation

## 2017-10-29 DIAGNOSIS — I5042 Chronic combined systolic (congestive) and diastolic (congestive) heart failure: Secondary | ICD-10-CM | POA: Insufficient documentation

## 2017-10-29 DIAGNOSIS — N183 Chronic kidney disease, stage 3 (moderate): Secondary | ICD-10-CM | POA: Diagnosis not present

## 2017-10-29 DIAGNOSIS — I130 Hypertensive heart and chronic kidney disease with heart failure and stage 1 through stage 4 chronic kidney disease, or unspecified chronic kidney disease: Secondary | ICD-10-CM | POA: Insufficient documentation

## 2017-10-29 DIAGNOSIS — M545 Low back pain: Secondary | ICD-10-CM | POA: Insufficient documentation

## 2017-10-29 DIAGNOSIS — Z7982 Long term (current) use of aspirin: Secondary | ICD-10-CM | POA: Diagnosis not present

## 2017-10-29 DIAGNOSIS — E1142 Type 2 diabetes mellitus with diabetic polyneuropathy: Secondary | ICD-10-CM | POA: Insufficient documentation

## 2017-10-29 DIAGNOSIS — I10 Essential (primary) hypertension: Secondary | ICD-10-CM

## 2017-10-29 DIAGNOSIS — Z794 Long term (current) use of insulin: Secondary | ICD-10-CM | POA: Diagnosis not present

## 2017-10-29 DIAGNOSIS — E1122 Type 2 diabetes mellitus with diabetic chronic kidney disease: Secondary | ICD-10-CM | POA: Insufficient documentation

## 2017-10-29 DIAGNOSIS — L409 Psoriasis, unspecified: Secondary | ICD-10-CM | POA: Diagnosis not present

## 2017-10-29 DIAGNOSIS — E1165 Type 2 diabetes mellitus with hyperglycemia: Secondary | ICD-10-CM | POA: Diagnosis not present

## 2017-10-29 DIAGNOSIS — IMO0002 Reserved for concepts with insufficient information to code with codable children: Secondary | ICD-10-CM

## 2017-10-29 DIAGNOSIS — E11319 Type 2 diabetes mellitus with unspecified diabetic retinopathy without macular edema: Secondary | ICD-10-CM | POA: Insufficient documentation

## 2017-10-29 DIAGNOSIS — Z79899 Other long term (current) drug therapy: Secondary | ICD-10-CM | POA: Diagnosis not present

## 2017-10-29 DIAGNOSIS — E785 Hyperlipidemia, unspecified: Secondary | ICD-10-CM | POA: Insufficient documentation

## 2017-10-29 DIAGNOSIS — M109 Gout, unspecified: Secondary | ICD-10-CM | POA: Insufficient documentation

## 2017-10-29 DIAGNOSIS — F329 Major depressive disorder, single episode, unspecified: Secondary | ICD-10-CM | POA: Insufficient documentation

## 2017-10-29 DIAGNOSIS — Z87891 Personal history of nicotine dependence: Secondary | ICD-10-CM | POA: Insufficient documentation

## 2017-10-29 DIAGNOSIS — K279 Peptic ulcer, site unspecified, unspecified as acute or chronic, without hemorrhage or perforation: Secondary | ICD-10-CM | POA: Insufficient documentation

## 2017-10-29 DIAGNOSIS — I4891 Unspecified atrial fibrillation: Secondary | ICD-10-CM | POA: Insufficient documentation

## 2017-10-29 DIAGNOSIS — E118 Type 2 diabetes mellitus with unspecified complications: Secondary | ICD-10-CM

## 2017-10-29 DIAGNOSIS — G8929 Other chronic pain: Secondary | ICD-10-CM | POA: Insufficient documentation

## 2017-10-29 DIAGNOSIS — G4733 Obstructive sleep apnea (adult) (pediatric): Secondary | ICD-10-CM | POA: Diagnosis not present

## 2017-10-29 DIAGNOSIS — Z7952 Long term (current) use of systemic steroids: Secondary | ICD-10-CM | POA: Diagnosis not present

## 2017-10-29 LAB — GLUCOSE, POCT (MANUAL RESULT ENTRY): POC Glucose: 268 mg/dl — AB (ref 70–99)

## 2017-10-29 MED ORDER — INSULIN ASPART 100 UNIT/ML FLEXPEN: PEN_INJECTOR | SUBCUTANEOUS | 5 refills | Status: DC

## 2017-10-29 MED ORDER — INSULIN DETEMIR 100 UNIT/ML FLEXPEN: 55.0000 [IU] | PEN_INJECTOR | Freq: Every day | SUBCUTANEOUS | 3 refills | Status: DC

## 2017-10-29 NOTE — Progress Notes (Signed)
Patient ID: Marco [name], male    DOB: Jan 09, 1956  MRN: [id number]  CC: Follow-up   Subjective: Marco [name] is a 62 y.o. male who presents for 1 wk f/u HTN and DM His concerns today include:  62 year old male with history of HTN,DM2 with neuropathy, CKD stage III, obesity, nonobstructive CAD, chronic combined CHF, OSA on CPAP, followed by rheumatology in [name] for psoriasis/gout/nonspec arthritis. On narcotics through [facility] for chronic pain in chest/back/shoulders/neck  1.  DM:  Checking BS TID before meals.  Has log with him.  A.m 150-180s; before lunch and dinner are in the 200-mid 300 range.   -eating habits unchanged. Takes Levemir 50 units and Novolog SSI.  On average, he is taking 18-26 units of Novolog with meals.  BS this a.m was 165 and he took 18 units.  Lunch and dinner are the 2 largest meals of the day for him.   2.  HTN:  Compliant with meds.  He stopped the Robitussin which I think was contributing to elev BS and BP.  Patient Active Problem List   Diagnosis Date Noted  . Renal failure (ARF), acute on chronic (HCC) 08/27/2017  . Dysphagia 01/09/2017  . Hyperlipidemia 12/23/2016  . Chronic combined systolic and diastolic CHF (congestive heart failure) ([place]) 09/09/2016  . Diabetes mellitus with complication ([place])   . Diabetic retinopathy ([place]) 07/22/2016  . Diabetic polyneuropathy associated with type 2 diabetes mellitus ([place]) 06/10/2016  . Myalgia and myositis 03/31/2016  . Dyspnea on exertion 02/09/2016  . Chronic gouty arthritis 11/29/2015  . Ache in joint 11/27/2015  . LBP (low back pain) 11/27/2015  . Arthralgia of multiple joints 11/27/2015  . Psoriasis 11/27/2015  . Breast pain in male 11/14/2015  . Sinusitis, chronic 10/16/2015  . Insomnia 10/16/2015  . Adult BMI 30+ 10/04/2015  . New onset of headaches after age 21 09/20/2015  . OSA (obstructive sleep apnea) 07/12/2015  . Low serum testosterone level 05/18/2015  . Depression 05/16/2015    . Chronic pain   . CAD (coronary artery disease) 12/24/2014  . Atrial fibrillation ([place]) 12/24/2014  . Essential hypertension 12/24/2014  . CKD (chronic kidney disease) stage 3, GFR 30-59 ml/min (HCC) 12/24/2014  . PUD (peptic ulcer disease) 12/24/2014     Current Outpatient Medications on File Prior to Visit  Medication Sig Dispense Refill  . ACCU-CHEK AVIVA PLUS test strip USE ONE STRIP TO CHECK BLOOD GLUCOSE 3 TIMES DAILY 100 each 12  . ACCU-CHEK FASTCLIX LANCETS MISC Use a directed to check blood sugar 3 times daily. 100 each 11  . allopurinol (ZYLOPRIM) 100 MG tablet Take 100 mg by mouth 2 (two) times daily.    [name] aspirin (EQ ASPIRIN ADULT LOW DOSE) 81 MG EC tablet Take 1 tablet (81 mg total) by mouth daily. Swallow whole. 90 tablet 3  . atorvastatin (LIPITOR) 40 MG tablet Take 1 tablet (40 mg total) by mouth daily. 90 tablet 3  . BD PEN NEEDLE NANO U/F 32G X 4 MM MISC USE ONE  THREE TIMES DAILY 100 each 11  . Blood Glucose Monitoring Suppl (ACCU-CHEK AVIVA PLUS) w/Device KIT 1 Device by Does not apply route 3 (three) times daily after meals. E11.9 1 kit 0  . Blood Pressure KIT 1 each by Does not apply route 2 (two) times daily. 1 each 0  . diclofenac sodium (VOLTAREN) 1 % GEL Apply 2 g topically 4 (four) times daily. Place over right knee for 5 days. 1 Tube 0  .  furosemide (LASIX) 40 MG tablet Take 1 tablet by mouth every morning (Patient taking differently: Take 40 mg by mouth every morning. Take 1 tablet by mouth every morning) 90 tablet 3  . glucose blood (ACCU-CHEK AVIVA PLUS) test strip 1 each by Other route 3 (three) times daily. E11.9 100 each 12  . losartan (COZAAR) 100 MG tablet Take 1 tablet (100 mg total) by mouth daily. 30 tablet 4  . metFORMIN (GLUCOPHAGE-XR) 500 MG 24 hr tablet Take 2 tablets (1,000 mg total) by mouth 2 (two) times daily. 360 tablet 3  . predniSONE (DELTASONE) 5 MG tablet Take 5 mg by mouth daily.    . pregabalin (LYRICA) 100 MG capsule Take 1 capsule  (100 mg total) by mouth 3 (three) times daily. 90 capsule 5  . ranitidine (ZANTAC) 150 MG tablet Take 1 tablet (150 mg total) by mouth at bedtime. (Patient taking differently: Take 150 mg by mouth daily before breakfast. ) 90 tablet 3  . Secukinumab (COSENTYX) 150 MG/ML SOSY Inject 150 mg into the skin every 28 (twenty-eight) days.     [name] zolpidem (AMBIEN CR) 6.25 MG CR tablet Take 1 tablet (6.25 mg total) by mouth at bedtime as needed for sleep. 30 tablet 1   No current facility-administered medications on file prior to visit.     No Known Allergies  Social History   Socioeconomic History  . Marital status: Married    Spouse name: Amethyst  . Number of children: 8  . Years of education: 62  . Highest education level: Not on file  Occupational History    Comment: disabled  Social Needs  . Financial resource strain: Not on file  . Food insecurity:    Worry: Not on file    Inability: Not on file  . Transportation needs:    Medical: Not on file    Non-medical: Not on file  Tobacco Use  . Smoking status: Former Smoker    Packs/day: 0.50    Years: 10.00    Pack years: 5.00    Types: Cigarettes    Last attempt to quit: 04/11/2008    Years since quitting: 9.5  . Smokeless tobacco: Never Used  Substance and Sexual Activity  . Alcohol use: No    Alcohol/week: 0.0 oz  . Drug use: No  . Sexual activity: Yes    Partners: Female    Birth control/protection: None  Lifestyle  . Physical activity:    Days per week: Not on file    Minutes per session: Not on file  . Stress: Not on file  Relationships  . Social connections:    Talks on phone: Not on file    Gets together: Not on file    Attends religious service: Not on file    Active member of club or organization: Not on file    Attends meetings of clubs or organizations: Not on file    Relationship status: Not on file  . Intimate partner violence:    Fear of current or ex partner: Not on file    Emotionally abused: Not on  file    Physically abused: Not on file    Forced sexual activity: Not on file  Other Topics Concern  . Not on file  Social History Narrative   Lives with wife. Does not work.  On disability.    Caffeine- coffee, 1/2 cup daily    Family History  Problem Relation Age of Onset  . Hypertension Mother   . Diabetes  Mother   . COPD Mother   . Lung cancer Mother        Smoker   . Hypertension Father   . Diabetes Father   . Heart Problems Father   . COPD Father   . Colon cancer Neg Hx   . Stomach cancer Neg Hx     Past Surgical History:  Procedure Laterality Date  . CARDIAC CATHETERIZATION  05/2014   ablation for atrial fibrillation  . CARDIAC CATHETERIZATION N/A 11/16/2015   Procedure: Left Heart Cath and Coronary Angiography;  Surgeon: Leonie Man, MD;  Location: [place] [facility];  Service: Cardiovascular;  Laterality: N/A;  . COLON RESECTION  09/2013   due to large, abnormal polpy. non cancerous per patient.   . COLON SURGERY  09/2014   colon resection     ROS: Review of Systems Neg except as above PHYSICAL EXAM: BP 116/78   Pulse 85   Temp 98.4 F (36.9 C) (Oral)   Resp 16   Wt 226 lb 3.2 oz (102.6 kg)   SpO2 95%   BMI 36.51 kg/m   Physical Exam  General appearance - alert, well appearing, and in no distress Mental status - alert, oriented to person, place, and time, normal mood, behavior, speech, dress, motor activity, and thought processes  BS:  268  ASSESSMENT AND PLAN: 1. Essential hypertension -at goal.  Continue current medications and DASH diet  2. Uncontrolled type 2 diabetes mellitus with complication, with long-term current use of insulin (HCC) -Increase Levemir to 55 units daily. Change NovoLog to 18 units with breakfast and 22 units with lunch and dinner. Follow-up with clinical pharmacist in 1 month.  Advised to bring blood sugar log with him. - POCT glucose (manual entry) - Insulin Detemir (LEVEMIR FLEXTOUCH) 100 UNIT/ML Pen; Inject 55  Units into the skin daily at 10 pm.  Dispense: 45 mL; Refill: 3 - insulin aspart (NOVOLOG FLEXPEN) 100 UNIT/ML FlexPen; Take 18 units subcut with breakfast and 22 units with lunch and dinner.  Dispense: 15 mL; Refill: 5  Patient was given the opportunity to ask questions.  Patient verbalized understanding of the plan and was able to repeat key elements of the plan.   Orders Placed This Encounter  Procedures  . POCT glucose (manual entry)     Requested Prescriptions   Signed Prescriptions Disp Refills  . Insulin Detemir (LEVEMIR FLEXTOUCH) 100 UNIT/ML Pen 45 mL 3    Sig: Inject 55 Units into the skin daily at 10 pm.  . insulin aspart (NOVOLOG FLEXPEN) 100 UNIT/ML FlexPen 15 mL 5    Sig: Take 18 units subcut with breakfast and 22 units with lunch and dinner.    Return in about 3 months (around 01/29/2018).  Karle Plumber, MD, FACP

## 2017-10-29 NOTE — Patient Instructions (Addendum)
Please give appoint with Stacy in 1 month for titration of insulin  Increased Levemir to 55 units daily.  Change Novolog to 18 units with breakfast and 22 units with lunch and dinner.

## 2017-10-30 ENCOUNTER — Telehealth: Payer: Self-pay | Admitting: Internal Medicine

## 2017-10-30 NOTE — Telephone Encounter (Signed)
Pt. Call to check on the pre-auth for his med Insulin Detemir (LEVEMIR FLEXTOUCH) 100 UNIT/ML Pen From Medicaid since he has none, please follow up

## 2017-10-30 NOTE — Telephone Encounter (Signed)
Received fax about needing a PA for Levemir through [facility] Medicaid. Levemir does not need a PA. I think it is a day supply issue.  Called pharmacy and they re-ran the script and it was a day supply issue so it is covered and they are filling it now.  Called patient and he is aware, he will pick it up today

## 2017-12-01 ENCOUNTER — Ambulatory Visit: Payer: Medicaid Other | Admitting: Pharmacist

## 2017-12-08 ENCOUNTER — Encounter: Payer: Self-pay | Admitting: Pharmacist

## 2017-12-08 ENCOUNTER — Ambulatory Visit: Payer: Medicaid Other | Admitting: Sports Medicine

## 2017-12-08 ENCOUNTER — Ambulatory Visit: Payer: Medicaid Other | Attending: Family Medicine | Admitting: Pharmacist

## 2017-12-08 DIAGNOSIS — E119 Type 2 diabetes mellitus without complications: Secondary | ICD-10-CM | POA: Insufficient documentation

## 2017-12-08 DIAGNOSIS — Z794 Long term (current) use of insulin: Secondary | ICD-10-CM | POA: Diagnosis not present

## 2017-12-08 DIAGNOSIS — E1165 Type 2 diabetes mellitus with hyperglycemia: Secondary | ICD-10-CM | POA: Diagnosis not present

## 2017-12-08 DIAGNOSIS — E118 Type 2 diabetes mellitus with unspecified complications: Secondary | ICD-10-CM

## 2017-12-08 DIAGNOSIS — IMO0002 Reserved for concepts with insufficient information to code with codable children: Secondary | ICD-10-CM

## 2017-12-08 MED ORDER — INSULIN ASPART 100 UNIT/ML FLEXPEN: PEN_INJECTOR | SUBCUTANEOUS | Status: DC

## 2017-12-08 NOTE — Progress Notes (Signed)
    S:    No chief complaint on file.  Patient arrives in good spirits.  Presents for diabetes evaluation, education, and management at the request of Dr. Wynetta Emery. He was referred on 10/29/17 and last saw Dr. Wynetta Emery on that day.  Patient reports adherence with medications.  Current diabetes medications include: Novolog 18-22-22 w/ breakfast, lunch, and dinner respectively, Levemir 55 units daily, and metformin XR 500mg  2 tablets BID  Patient denies hypoglycemic events.  Patient reported dietary habits:  Reports eating 3 meals/day Breakfast: Pt reports not being much of a breakfast eater. He does eat 1-2 pieces of toast on average. Lunch: Pt reports eating whatever he can find in the house for lunch. Dinner: Pt reports cooking whatever he finds in the house. He reports baking vs frying foods. Snacks: Pt reports eating occasional oatmeal cookie, mixed fruit cocktail every once in a while for snacks. Drinks: Pt drinks cran-apple juice daily with the occasional soda. He reports that he is trying to drink more water.  Patient reported exercise habits: Pt doesn't report engaging in physical activity.    Patient denies nocturia.  Patient denies neuropathy. Patient denies visual changes. Patient denies self foot exams.    O:  Physical Exam   ROS   Lab Results  Component Value Date   HGBA1C 7.9 08/27/2017   There were no vitals filed for this visit.  POCT glucose: Pt reports taking his blood sugar this morning before eating breakfast and it was 109. He did not elect to have a POCT glucose level taken at the visit today.  Home fasting CBG: 95-156  Lunch CBG: 109-302 Dinner CBG: 176-260  A/P: Diabetes longstanding diagnosed currently uncontrolled. Patient denies hypoglycemic events and is able to verbalize appropriate hypoglycemia management plan. Patient reports adherence with medication. Control is suboptimal due to dietary indiscretion and sedentary lifestyle.   Patient  reports checking home fasting and prandial levels at breakfast, lunch, and dinner. Of note, many home fasting levels appear to be at goal with lunch and dinner levels remaining elevated. I counseled patient concerning injection technique and dietary habits. I do not believe he is having any issues with injection technique. There is some dietary indiscretion present but patient reports eating what is available.  After talking with Dr. Wynetta Emery, will have patient increase from 22 to 24 units of Novolog with lunch and dinner. Pt verbalizes understanding of the plan and knows to monitor sugars and bring them in to his next appointment. Since patient will likely need titration of mealtime insulin to achieve goals, I will see him on 12/16/17 to reassess.   Per his charts, pt has not had an A1c since 08/27/17.  Written patient instructions provided.  Total time in face to face counseling: 15 minutes.   Follow up in Pharmacist Clinic Visit in 12/16/17.   Benard Halsted, PharmD [facility]  [phone]

## 2017-12-08 NOTE — Patient Instructions (Addendum)
Thanks for coming to see me today. Please increase your lunch and dinner dose of Novolog to 24 units. Please continue to take your other medications as prescribed. Also, try to continue taking your sugars at home. I will see you in 2 weeks.

## 2017-12-11 ENCOUNTER — Telehealth: Payer: Self-pay | Admitting: Internal Medicine

## 2017-12-11 DIAGNOSIS — G4700 Insomnia, unspecified: Secondary | ICD-10-CM

## 2017-12-11 NOTE — Telephone Encounter (Signed)
Pt called to request a refill for zolpidem (AMBIEN CR) 6.25 MG CR tablet Please sent it to [facility], [address] Please follow up

## 2017-12-14 ENCOUNTER — Ambulatory Visit: Payer: Medicaid Other | Admitting: Podiatry

## 2017-12-14 ENCOUNTER — Encounter: Payer: Self-pay | Admitting: Podiatry

## 2017-12-14 DIAGNOSIS — E1142 Type 2 diabetes mellitus with diabetic polyneuropathy: Secondary | ICD-10-CM

## 2017-12-14 MED ORDER — ZOLPIDEM TARTRATE ER 6.25 MG PO TBCR: 6.2500 mg | EXTENDED_RELEASE_TABLET | Freq: Every evening | ORAL | 1 refills | Status: DC | PRN

## 2017-12-14 NOTE — Telephone Encounter (Signed)
Patient called to check on the status of his medication refill for [facility]. Please follow up

## 2017-12-14 NOTE — Telephone Encounter (Signed)
Will forward to pcp

## 2017-12-14 NOTE — Telephone Encounter (Signed)
Rec request for RF on Ambien.  N[facility] reviewed and is appropriate.  RF given #[id number] with 1 RF.

## 2017-12-14 NOTE — Telephone Encounter (Signed)
Contacted pt to make aware rx is ready pt was unable to pick up and was unable to lvm

## 2017-12-14 NOTE — Progress Notes (Signed)
    S:    No chief complaint on file.  Patient arrives in good spirits.  Presents for diabetes evaluation, education, and management at the request of Dr. Wynetta Emery. He was referred in March 2019. I last saw him on 12/08/17 and his lunch and dinner Novolog doses were increased.   Patient reports adherence with medications.  Current diabetes medications include: Novolog 18-24-24 w/ breakfast, lunch, and dinner respectively, Levemir 55 units daily, and metformin XR 500mg  2 tablets BID  Patient denies hypoglycemic events.  Patient reported dietary habits:  No changes. Pt reports that he sometimes struggles with sweets by eating a slice of cake or fruit cocktail.   Patient reported exercise habits: Pt does not perform daily physical activity.   Patient denies nocturia.  Patient denies any changes in baseline neuropathy. Patient denies visual changes. Patient reports self foot exams.    O:  Physical Exam   ROS   Lab Results  Component Value Date   HGBA1C 7.9 08/27/2017   There were no vitals filed for this visit.  POCT glucose: 150 Pt reported home glucose levels from the last week:          B         L         D 5/1   136    189      258 5/2   138    183      277 5/3   175    184      260   5/4   120    180      193 5/5   125    150      292 5/6   158    194      179 5/7   124    183      249  Of note, pt states that he takes his blood sugar levels before he eats.   A/P: Diabetes longstanding diagnosed currently uncontrolled based on A1c of 7.9 and above goal home glucose levels. Patient denies hypoglycemic events and is able to verbalize appropriate hypoglycemia management plan. Patient reports adherence with medication. Control is suboptimal due to dietary indiscretion and sedentary lifestyle.   Patient reports checking home fasting and prandial levels before eating at breakfast, lunch, and dinner. Of note, many home fasting levels appear to be at goal. There are still  many above goal levels when the pt checks before eating at lunch and dinner.   After talking with Dr. Wynetta Emery, will increase breakfast and lunch dose of novolog to 20 and 28 units respectively. Pt verbalizes understanding of the plan and knows to monitor sugars and bring them in to his next appointment. Since patient will likely need titration of mealtime insulin to achieve goals, I will see him in a couple of weeks to reassess.   Per his charts, pt has not had an A1c since 08/27/17.  Written patient instructions provided.  Total time in face to face counseling: 15 minutes.   Follow up appointment with me on 12/30/17.   Benard Halsted, PharmD [facility]  [phone]

## 2017-12-16 ENCOUNTER — Encounter: Payer: Self-pay | Admitting: Pharmacist

## 2017-12-16 ENCOUNTER — Other Ambulatory Visit: Payer: Self-pay | Admitting: Pharmacist

## 2017-12-16 ENCOUNTER — Ambulatory Visit: Payer: Medicaid Other | Attending: Internal Medicine | Admitting: Pharmacist

## 2017-12-16 DIAGNOSIS — Z794 Long term (current) use of insulin: Secondary | ICD-10-CM | POA: Diagnosis not present

## 2017-12-16 DIAGNOSIS — E1165 Type 2 diabetes mellitus with hyperglycemia: Secondary | ICD-10-CM | POA: Diagnosis present

## 2017-12-16 DIAGNOSIS — E118 Type 2 diabetes mellitus with unspecified complications: Secondary | ICD-10-CM | POA: Insufficient documentation

## 2017-12-16 DIAGNOSIS — IMO0002 Reserved for concepts with insufficient information to code with codable children: Secondary | ICD-10-CM

## 2017-12-16 LAB — GLUCOSE, POCT (MANUAL RESULT ENTRY): POC Glucose: 150 mg/dl — AB (ref 70–99)

## 2017-12-16 MED ORDER — INSULIN ASPART 100 UNIT/ML FLEXPEN
PEN_INJECTOR | SUBCUTANEOUS | 3 refills | Status: DC
Start: 2017-12-16 — End: 2017-12-31

## 2017-12-16 NOTE — Patient Instructions (Addendum)
Thank you for coming to see me today. Please continue checking home sugars. Please increase your Novolog dose at breakfast to 20 units and at lunch to 28 units. Keep your dinner dose the same at 24 units. I will see you back in a couple of weeks to reassess.

## 2017-12-16 NOTE — Progress Notes (Signed)
   HPI: 62 year old male presenting today with a chief complaint of burning pain and numbness in the bilateral feet that has been ongoing for several months. He states the feet are very sensitive and he cannot wash them in hot water. He reports muscles spasms in the bilateral lower extremities from the feet to his knees. He has been taking Lyrica with some relief. Patient is here for further evaluation and treatment.   Past Medical History:  Diagnosis Date  . Benign colon polyp 08/01/2013   [facility] in [place]. large base tranverse colon polyp was biopsied. polyp was benign with minimal surface hyperplastic change.  . Chronic combined systolic and diastolic CHF (congestive heart failure) ([place])   . Chronic pain   . CKD (chronic kidney disease), stage III ([place])   . Diabetes mellitus without complication ([place])   . Diverticulosis   . Esophageal hiatal hernia 07/29/2013   confirmed on EGD   . Esophageal stricture   . Essential hypertension   . Gastritis 07/29/2013   confirmed on EGD, bx done an negative for intestinal metaplasia, dsyplasia or H. pylori. normal gastric emptying study done 07/13/2013.  [name] GERD (gastroesophageal reflux disease)   . Morbid obesity ([place])   . Non-obstructive CAD    a. 02/2013 Cath ([place]): nonobs dzs;  b. 09/2014 Myoview ([place]): EF 55%, no ischemia;  c. 12/2014 Echo: EF 50-55%, gr1 DD, no effusion.  . Persistent atrial fibrillation ([place])    a. 02/2013 s/p rfca in [place], [place]-->prev on Xarelto, d/c'd 2/2 anemia, ? GIB.  [name] Rheumatoid arthritis ([place])   . Sigmoid diverticulosis 08/01/2013   confirmed on colonscopy. record scanned into chart     Physical Exam: General: The patient is alert and oriented x3 in no acute distress.  Dermatology: Skin is warm, dry and supple bilateral lower extremities. Negative for open lesions or macerations.  Vascular: Palpable pedal pulses bilaterally. No edema or erythema noted. Capillary refill within normal  limits.  Neurological: Epicritic and protective threshold diminished bilaterally.   Musculoskeletal Exam: Range of motion within normal limits to all pedal and ankle joints bilateral. Muscle strength 5/5 in all groups bilateral.   Assessment: 1. DM with neuropathy   Plan of Care:  1. Patient evaluated.   2. Continue taking Lyrica as directed by rheumatologist.  3. Recommended good shoe gear.  4. Stressed importance of controlling blood sugar levels.  5. Return to clinic as needed.      Edrick Kins, DPM [facility]  Dr. Edrick Kins, DPM    [address], [place] [address]                Office [phone]  Fax [phone]

## 2017-12-21 NOTE — Telephone Encounter (Signed)
Will forward to pcp

## 2017-12-21 NOTE — Telephone Encounter (Signed)
Patient called stating that he went to the Rheumatologist and was told to contact his PCP about  Stopping his Metformin due to some levels that the Rheumatologist found. Please f/u

## 2017-12-22 ENCOUNTER — Telehealth: Payer: Self-pay | Admitting: Internal Medicine

## 2017-12-22 DIAGNOSIS — I10 Essential (primary) hypertension: Secondary | ICD-10-CM

## 2017-12-22 NOTE — Telephone Encounter (Signed)
Contacted pt to go over Dr. Johnson response pt states he understands and doesn't have any questions or concerns  

## 2017-12-24 NOTE — Telephone Encounter (Signed)
Pt is aware and will be coming to have blood work done when he comes to see [employer]

## 2017-12-27 NOTE — Progress Notes (Addendum)
S:    No chief complaint on file.  Patient arrives in good spirits.  Presents for insulin titration and diabetes management at the request of Dr. Wynetta Emery. He was referred March 2019.   HPI: I last saw pt on 12/16/17 and his breakfast and lunch Novolog doses were increased. Pt states he is adjusting to this. Of note, recent labs were performed by Rheumatology at [facility]. Pt's eGFR is 45 and has decreased since 08/2017. Dr. Wynetta Emery wishes to discontinue metformin at this time.   Patient reports adherence with medications. Of note, pt ran out of Levemir 12/29/17.   Current diabetes medications include:  - Levemir 55 units daily  - Novolog 20-28-24 w/ breakfast, lunch and dinner respectively  Past medications tried: Januvia '100mg'$ , metformin 500 XR  Patient denies hypoglycemic events.  Patient reported dietary habits:  No changes in diet. Pt reports that he sometimes struggles with sweets by eating a slice of cake or fruit cocktail.   Patient reported exercise habits: Pt does not perform daily physical activity. Pt reports SOB with walking which limits physical activity.    Patient denies nocturia.  Patient denies any changes in baseline neuropathy. Patient denies visual changes. Patient reports self foot exams.   O:  Physical Exam  ROS  Lab Results  Component Value Date   HGBA1C 7.9 08/27/2017   There were no vitals filed for this visit.  POCT glucose: 210. Of note, pt states that he hasn't taken lunch dose of novolog and has not eaten lunch. Also, he ran out of Levemir and has not had Levemir today. Home glucose levels (pre-prandial):             B        L         D   5/13   135     139     258 5/14    99      127     197 5/15   113     141     250 5/16    92      131     150 5/17   123     181     162 5/18   113     180     199 5/19   136     168     266 5/20   143     225     227 5/21   129     175     194 5/22   150  A/P: Diabetes longstanding diagnosed  currently uncontrolled based on A1c of 7.9 and above goal home glucose levels. Patient denies hypoglycemic events and is able to verbalize appropriate hypoglycemia management plan. Patient reports adherence with medication. Control is suboptimal due to dietary indiscretion and sedentary lifestyle.    Pt recently had BMP done with Rheumatology at [facility]. Scr and eGFR declining since 08/2017. Received recommendation from Dr. Wynetta Emery to discontinue metformin for now. I have discussed this with patient. Discussed to patient that this will likely cause a need for further insulin titration. Pt verbalizes understanding.   Home levels improving with current insulin regimen. Will forward information to Dr. Wynetta Emery. If she is amenable, will have patient increase Novolog at lunch from 28 units to 32 units to better control pre-dinner glucose levels. Reinforced importance of diet and physical activity. Patient knows and is agreeable to continue checking home sugars.  Per his charts, pt has not had an A1c since 08/27/17. He is seeing his PCP in June. Anticipate A1c level then.   Written patient instructions provided.  Total time in face to face counseling: 20 minutes.   Follow up appointment 01/06/18 with me.   Patient seen with PharmD Candidates: Tyna Jaksch, Class of [address] Class of 2022   Benard Halsted, [place] [facility]  [phone]

## 2017-12-30 ENCOUNTER — Encounter: Payer: Self-pay | Admitting: Pharmacist

## 2017-12-30 ENCOUNTER — Telehealth: Payer: Self-pay | Admitting: Internal Medicine

## 2017-12-30 ENCOUNTER — Ambulatory Visit: Payer: Medicaid Other | Attending: Internal Medicine | Admitting: Pharmacist

## 2017-12-30 DIAGNOSIS — IMO0002 Reserved for concepts with insufficient information to code with codable children: Secondary | ICD-10-CM

## 2017-12-30 DIAGNOSIS — E1165 Type 2 diabetes mellitus with hyperglycemia: Secondary | ICD-10-CM | POA: Diagnosis not present

## 2017-12-30 DIAGNOSIS — E118 Type 2 diabetes mellitus with unspecified complications: Secondary | ICD-10-CM

## 2017-12-30 DIAGNOSIS — Z794 Long term (current) use of insulin: Secondary | ICD-10-CM | POA: Insufficient documentation

## 2017-12-30 LAB — GLUCOSE, POCT (MANUAL RESULT ENTRY): POC GLUCOSE: 210 mg/dL — AB (ref 70–99)

## 2017-12-30 NOTE — Telephone Encounter (Signed)
PC placed to pt today.  I got a recording that this VM box has not been set up as yet. I received note from pt's Rheumatology at [facility].   Recent BMP done 12/21/2017 revealed Creat of 1.81 and eGFR of 45, both decreased from 08/2017. I want to d/c Metformin for now. May have to increase long acting insulin instead.  Pt has appt with clinical pharmacist today.  I will forward this message to him so that if pt comes in, he can have him stop the Metformin.

## 2017-12-30 NOTE — Patient Instructions (Addendum)
Thank you for coming to see Marco [name] today.   1. Please STOP taking metformin. Your PCP (Dr. Wynetta Emery) recommends this based on your kidney function.   2. I will reach out to Dr. Wynetta Emery and call you regarding increase in your insulin.   3.Continue to check your blood sugars at home.   4. I will see you again in 2 weeks. You have a Dr.'s appointment on 01/29/18.

## 2017-12-31 ENCOUNTER — Other Ambulatory Visit: Payer: Self-pay | Admitting: Pharmacist

## 2017-12-31 ENCOUNTER — Other Ambulatory Visit: Payer: Self-pay | Admitting: Internal Medicine

## 2017-12-31 ENCOUNTER — Telehealth: Payer: Self-pay | Admitting: Pharmacist

## 2017-12-31 MED ORDER — INSULIN ASPART 100 UNIT/ML FLEXPEN
PEN_INJECTOR | SUBCUTANEOUS | 3 refills | Status: DC
Start: 2017-12-31 — End: 2018-01-20

## 2017-12-31 MED ORDER — CETIRIZINE HCL 10 MG PO TABS: 10.0000 mg | ORAL_TABLET | Freq: Every day | ORAL | 3 refills | Status: DC

## 2017-12-31 NOTE — Telephone Encounter (Signed)
PC placed this morning @ 10:20 AM. Confirmed with patient that Dr. Wynetta Emery approved Novolog lunchtime dose. Instructed patient to increase from 28 to 32 units. Pt verbalized understanding of the plan.

## 2018-01-05 NOTE — Progress Notes (Addendum)
S:    No chief complaint on file.  Patient arrives frustrated over sugar elevations.  Presents for insulin titration and diabetes management at the request of Dr. Wynetta Emery. He was referred March 2019.   I last saw pt on 12/30/17 and his lunch Novolog dose was increased. Pt states he is adjusting to this. Pt also advised to d/c metformin d/t declining eGFR. Pt states that he has done this.   Patient reports adherence with medications. Patient denies hypoglycemic events since last being seen.  Current diabetes medications include:  - Levemir 55 units daily  - Novolog 20-32-24 w/ breakfast, lunch and dinner respectively  Past medications tried:  - Januvia '100mg'$  - metformin 500 XR  Patient reported dietary habits:   No changes in diet. Pt reports that he sometimes struggles with sweets by eating a slice of cake or fruit cocktail.   Patient reported exercise habits: Pt does not perform daily physical activity. Pt reports SOB with walking which limits physical activity.    Patient denies nocturia.  Patient denies any changes in baseline neuropathy. Patient denies visual changes. Patient reports self foot exams.   O:  Physical Exam  ROS  Lab Results  Component Value Date   HGBA1C 7.9 08/27/2017   There were no vitals filed for this visit.  POCT glucose: 343. Of note, pt states that he hasn't taken lunch dose of novolog and has not eaten lunch.  Home glucose levels (pre-prandial):            B        L         D 5/23    135    386     270 5/24    176     294    269   5/25    163     240    411  5/26    198     236    367 5/27    163     237    369 5/28    212     268    364 5/29    219              A/P: Diabetes longstanding currently uncontrolled based on A1c of 7.9 and above goal home glucose levels. Patient denies hypoglycemic events and is able to verbalize appropriate hypoglycemia management plan. Patient reports adherence with medication. Control is suboptimal due  to dietary indiscretion, sedentary lifestyle, and recent metformin discontinuation.    Of note, pt has a later start to his day and has not taken lunch insulin yet. With his sugar level in clinic, we would usually give insulin at this time. After discussing with pt, will have him take lunch-time insulin when he gets home. Pt is agreeable to this versus receiving insulin in clinic.  Home levels not improving, due to recent metformin discontinuation and inadequate insulin dose. After discussing case with Dr. Wynetta Emery, will have patient increase Levemir by 6 units.  Reinforced importance of diet. Patient knows and is agreeable to continue checking home sugars.   Per his charts, pt has not had an A1c since 08/27/17. He is seeing his PCP in June. Anticipate A1c level then.   Written patient instructions provided.  Total time in face to face counseling: 20 minutes.   Follow up appointment 01/13/18 with me.   Patient seen with PharmD Candidates: Tyna Jaksch, Class of 2020 Mil[employer] Height, Class of 2022  Benard Halsted, PharmD [facility]  [phone]

## 2018-01-06 ENCOUNTER — Encounter: Payer: Self-pay | Admitting: Pharmacist

## 2018-01-06 ENCOUNTER — Ambulatory Visit: Payer: Medicaid Other | Attending: Internal Medicine | Admitting: Pharmacist

## 2018-01-06 DIAGNOSIS — IMO0002 Reserved for concepts with insufficient information to code with codable children: Secondary | ICD-10-CM

## 2018-01-06 DIAGNOSIS — E1165 Type 2 diabetes mellitus with hyperglycemia: Secondary | ICD-10-CM

## 2018-01-06 DIAGNOSIS — E119 Type 2 diabetes mellitus without complications: Secondary | ICD-10-CM | POA: Insufficient documentation

## 2018-01-06 DIAGNOSIS — E118 Type 2 diabetes mellitus with unspecified complications: Secondary | ICD-10-CM

## 2018-01-06 DIAGNOSIS — R0602 Shortness of breath: Secondary | ICD-10-CM | POA: Diagnosis not present

## 2018-01-06 DIAGNOSIS — Z794 Long term (current) use of insulin: Secondary | ICD-10-CM | POA: Insufficient documentation

## 2018-01-06 LAB — GLUCOSE, POCT (MANUAL RESULT ENTRY): POC GLUCOSE: 343 mg/dL — AB (ref 70–99)

## 2018-01-06 NOTE — Patient Instructions (Addendum)
Thank you for coming to see [place] today. The items we dicussed today are as follows:   1. With the discontinuation of metformin, your blood sugars have increased. Because of this, we need to increase your Levemir. I will reach out to Dr. Wynetta Emery. Once she approves, I will have you increase your Levemir dose from 55 units to 65 units.   2. Please continue to check your blood sugars. Record these and bring these in to your next visit.   3. Continue making health diet choices.

## 2018-01-07 ENCOUNTER — Telehealth: Payer: Self-pay | Admitting: Pharmacist

## 2018-01-07 ENCOUNTER — Other Ambulatory Visit: Payer: Self-pay | Admitting: Pharmacist

## 2018-01-07 MED ORDER — INSULIN DETEMIR 100 UNIT/ML FLEXPEN: 61.0000 [IU] | PEN_INJECTOR | Freq: Every day | SUBCUTANEOUS | 3 refills | Status: DC

## 2018-01-07 NOTE — Telephone Encounter (Signed)
Patient returned call in response to VM I left him. States that sugars last night were in the high 400s after dinner. Reinforced that sugars overall are expected to increase d/t recent d/c of metformin. We plan to titrate his insulin to achieve goal levels. After discussing with Dr. Wynetta Emery, I instructed pt to increase from 55 to 61 units of Levemir daily. I see him on Wednesday (01/13/18) to reassess. He will need continued titration of insulin. Will focus on long-acting first to achieve fasting goals and decrease meal-time dose requirement.  Benard Halsted, PharmD  [place] [facility]  [phone]

## 2018-01-13 ENCOUNTER — Encounter: Payer: Self-pay | Admitting: Pharmacist

## 2018-01-13 ENCOUNTER — Ambulatory Visit: Payer: Medicaid Other | Attending: Family Medicine | Admitting: Pharmacist

## 2018-01-13 DIAGNOSIS — E1165 Type 2 diabetes mellitus with hyperglycemia: Secondary | ICD-10-CM | POA: Diagnosis not present

## 2018-01-13 DIAGNOSIS — E118 Type 2 diabetes mellitus with unspecified complications: Secondary | ICD-10-CM | POA: Insufficient documentation

## 2018-01-13 DIAGNOSIS — Z794 Long term (current) use of insulin: Secondary | ICD-10-CM | POA: Diagnosis not present

## 2018-01-13 DIAGNOSIS — IMO0002 Reserved for concepts with insufficient information to code with codable children: Secondary | ICD-10-CM

## 2018-01-13 LAB — GLUCOSE, POCT (MANUAL RESULT ENTRY): POC GLUCOSE: 261 mg/dL — AB (ref 70–99)

## 2018-01-13 NOTE — Progress Notes (Signed)
S:    Patient arrives in poor spirits. Presents for insulin titration and diabetes management at the request of Dr. Wynetta Emery. He was referred March 2019.   I last saw pt on 01/06/18 and his Levemir dose was increased. Pt states he is adjusting to this.   He reports adherence with medications. Denies hypoglycemic events since last being seen.  Current diabetes medications include:  - Levemir 61 units daily  - Novolog 20-32-24 w/ breakfast, lunch and dinner respectively  Past medications tried:  - Januvia 100mg  - metformin 500 XR  Patient reported dietary habits:   Pt initially reports no changes in diet. Upon further questioning, he is still making several poor choices. He reports drinking juice in the morning, eating fruit cocktail (in syrup), and sometimes eating as Pt reports that he sometimes struggles with sweets by eating a slice of cake or fruit cocktail.   Patient reported exercise habits: Pt does not perform daily physical activity. Pt reports SOB with walking which limits physical activity.    Patient denies nocturia.  Patient denies any changes in baseline neuropathy. Of note, patient does state that he is having trouble walking d/t lower leg pain. I will pass this information along to pt's PCP.  Patient denies visual changes. Patient reports self foot exams.   O:  Physical Exam  ROS  Lab Results  Component Value Date   HGBA1C 7.9 08/27/2017   There were no vitals filed for this visit.  POCT glucose: 261.  Home glucose levels (pre-prandial):            B        L       D 5/29    219    268    364 5/30    211    254    365   5/31    189    326    200  6/01    214    277    387 6/02    246    260    417 6/03    230    201    348 6/04    230    224    197    A/P: Diabetes longstanding currently uncontrolled based on A1c of 7.9 and above goal home glucose levels. Patient denies hypoglycemic events and is able to verbalize appropriate hypoglycemia management  plan. Patient reports adherence with medication.   Control is suboptimal due to dietary indiscretion and sedentary lifestyle. I feel strongly that he is continuing to make poor diet choices even after extensive diet counseling. Patient would benefit from dietician referral.  While he eats in small amounts, he reports eating foods high in simple carbs (bread, pasta, rice, sweets). Reinforced importance of diet. Patient knows and is agreeable to continue checking home sugars.  Patient is discouraged but knows we must increase insulin to cover for control lost with metformin discontinuation. We adjusted his Levemir last time with no improvement, so he needs continued titration. After discussing with Dr. Wynetta Emery, will have patient increase Levemir by 7 units from 61 to 68 daily.     Per his charts, pt has not had an A1c since 08/27/17. He is seeing his PCP in June. Anticipate A1c level then.   Written patient instructions provided.  Total time in face to face counseling: 30 minutes.   Follow up appointment 01/20/18 with me.   Patient seen with PharmD Candidate: Milus Height, Class  of [place], [place] [facility]  [phone]

## 2018-01-13 NOTE — Patient Instructions (Addendum)
Thank you for coming to see me today. Please do the following:   1. Increase Levemir 61 to 68 units daily. Sugars are improving. We will continue to increase Levemir to get better control.   2. Continue checking home sugars.  3. I will see you in 1 week. After that, you see Dr. Wynetta Emery on the 21st of June.

## 2018-01-14 ENCOUNTER — Other Ambulatory Visit: Payer: Self-pay | Admitting: Pharmacist

## 2018-01-14 MED ORDER — INSULIN DETEMIR 100 UNIT/ML FLEXPEN: PEN_INJECTOR | SUBCUTANEOUS | 3 refills | Status: DC

## 2018-01-15 NOTE — Progress Notes (Signed)
    S:    Patient arrives in poor spirits. Presents for insulin titration and diabetes management at the request of Dr. Wynetta Emery. He was referred March 2019.   I last saw pt on 01/13/18 and his Levemir dose was increased. Pt states he is adjusting to this.   He reports adherence with medications. Denies hypoglycemic events since last being seen.  Current diabetes medications include:  - Levemir 68 units daily  - Novolog 20-32-24 w/ breakfast, lunch and dinner respectively  Past medications tried:  - Januvia 100mg  - metformin 500 XR  Patient reported dietary habits:   No changes   Patient reported exercise habits: Pt does not perform daily physical activity. Pt reports SOB with walking which limits physical activity.    Patient denies nocturia.  Patient denies any changes in baseline neuropathy.  Patient reports self foot exams.   O:  Physical Exam  ROS  Lab Results  Component Value Date   HGBA1C 7.9 08/27/2017   There were no vitals filed for this visit.  POCT glucose: 241   Home levels Fasting range: 170-298 Pre-lunch: 195 - 290 Pre-dinner: 153 - 314   A/P: Diabetes longstanding currently uncontrolled based on A1c of 7.9 and above goal home glucose levels. Patient denies hypoglycemic events and is able to verbalize appropriate hypoglycemia management plan. Patient reports adherence with medication.   Pt reports cold/congestion symptoms with SOB and chest tightness. Pt reports he has some SOB at baseline but has gotten worse since dealing with congestion. Chest tightness reported as baseline. In addition to feeling bad, pt continues to be discouraged with insulin titration.  We adjusted his Levemir last time with minimal improvement. After discussing with him, pt is amenable to increase Novolog and Levemir. Pt is testing pre-meals. Will take into consideration when adjusting bolus insulin.   -Increase Levemir from 68 to 72 units daily. -Increase Novolog to 23 units  before breakfast, 35 units before lunch, and 27 units before dinner -Tried to have pt seen in clinic. Pt declines and states he will wait until Friday (01/29/18).  -Reinforced importance of calling clinic/going to the ED should symptoms worsen or persist.  -Pt to continue checking home sugars. He knows to contact us with any home levels >500 or <70.  -Per his charts, pt has not had an A1c since 08/27/17. He is seeing his PCP in June. Anticipate A1c level then.   Written patient instructions provided. Pt verbalizes understanding and is agreeable to the plan.  Total time in face to face counseling: 15 minutes.   Follow up appointment with PCP 01/29/18  Patient seen with PharmD Candidate: Milus Height, Class of 2022   Benard Halsted, [place] [facility]  [phone]

## 2018-01-20 ENCOUNTER — Ambulatory Visit: Payer: Medicaid Other | Attending: Internal Medicine | Admitting: Pharmacist

## 2018-01-20 ENCOUNTER — Encounter: Payer: Self-pay | Admitting: Pharmacist

## 2018-01-20 ENCOUNTER — Encounter: Payer: Self-pay | Admitting: Internal Medicine

## 2018-01-20 DIAGNOSIS — E1165 Type 2 diabetes mellitus with hyperglycemia: Secondary | ICD-10-CM | POA: Diagnosis not present

## 2018-01-20 DIAGNOSIS — IMO0002 Reserved for concepts with insufficient information to code with codable children: Secondary | ICD-10-CM

## 2018-01-20 DIAGNOSIS — Z794 Long term (current) use of insulin: Secondary | ICD-10-CM | POA: Diagnosis not present

## 2018-01-20 DIAGNOSIS — E119 Type 2 diabetes mellitus without complications: Secondary | ICD-10-CM | POA: Diagnosis not present

## 2018-01-20 DIAGNOSIS — E118 Type 2 diabetes mellitus with unspecified complications: Secondary | ICD-10-CM

## 2018-01-20 LAB — GLUCOSE, POCT (MANUAL RESULT ENTRY): POC Glucose: 241 mg/dl — AB (ref 70–99)

## 2018-01-20 MED ORDER — INSULIN DETEMIR 100 UNIT/ML FLEXPEN: PEN_INJECTOR | SUBCUTANEOUS | 0 refills | Status: DC

## 2018-01-20 MED ORDER — INSULIN ASPART 100 UNIT/ML FLEXPEN: PEN_INJECTOR | SUBCUTANEOUS | 0 refills | Status: DC

## 2018-01-20 NOTE — Patient Instructions (Addendum)
Thank you for coming to see me today. Please do the following:   1. Please increase Levemir from 68 to 72 units.   2. Please increase Novolog as follows: 23 units with breakfast, 35 units with lunch, and 27 units with dinner.  3. Continue to monitor home sugars. Bring these in to your next visit.  4. Please call the clinic if you feel bad enough to see Dr. Wynetta Emery before next Friday (01/29/18).

## 2018-01-20 NOTE — Progress Notes (Signed)
Received note from [facility].  Pt being treated with Cipro 500 mg BID #60 with 1 RF for chronic prostatitis.

## 2018-01-22 ENCOUNTER — Ambulatory Visit: Payer: Medicaid Other | Admitting: Internal Medicine

## 2018-01-29 ENCOUNTER — Ambulatory Visit: Payer: Medicaid Other | Attending: Internal Medicine | Admitting: Internal Medicine

## 2018-01-29 ENCOUNTER — Encounter: Payer: Self-pay | Admitting: Internal Medicine

## 2018-01-29 VITALS — BP 129/75 | HR 86 | Temp 98.0°F | Ht 66.0 in | Wt 221.6 lb

## 2018-01-29 DIAGNOSIS — E11319 Type 2 diabetes mellitus with unspecified diabetic retinopathy without macular edema: Secondary | ICD-10-CM | POA: Insufficient documentation

## 2018-01-29 DIAGNOSIS — Z7952 Long term (current) use of systemic steroids: Secondary | ICD-10-CM | POA: Insufficient documentation

## 2018-01-29 DIAGNOSIS — E1140 Type 2 diabetes mellitus with diabetic neuropathy, unspecified: Secondary | ICD-10-CM | POA: Diagnosis not present

## 2018-01-29 DIAGNOSIS — IMO0002 Reserved for concepts with insufficient information to code with codable children: Secondary | ICD-10-CM

## 2018-01-29 DIAGNOSIS — Z836 Family history of other diseases of the respiratory system: Secondary | ICD-10-CM | POA: Diagnosis not present

## 2018-01-29 DIAGNOSIS — Z8249 Family history of ischemic heart disease and other diseases of the circulatory system: Secondary | ICD-10-CM | POA: Diagnosis not present

## 2018-01-29 DIAGNOSIS — I10 Essential (primary) hypertension: Secondary | ICD-10-CM

## 2018-01-29 DIAGNOSIS — E1165 Type 2 diabetes mellitus with hyperglycemia: Secondary | ICD-10-CM | POA: Insufficient documentation

## 2018-01-29 DIAGNOSIS — Z79899 Other long term (current) drug therapy: Secondary | ICD-10-CM | POA: Insufficient documentation

## 2018-01-29 DIAGNOSIS — M1A9XX0 Chronic gout, unspecified, without tophus (tophi): Secondary | ICD-10-CM | POA: Insufficient documentation

## 2018-01-29 DIAGNOSIS — N183 Chronic kidney disease, stage 3 (moderate): Secondary | ICD-10-CM | POA: Insufficient documentation

## 2018-01-29 DIAGNOSIS — Z801 Family history of malignant neoplasm of trachea, bronchus and lung: Secondary | ICD-10-CM | POA: Diagnosis not present

## 2018-01-29 DIAGNOSIS — Z7982 Long term (current) use of aspirin: Secondary | ICD-10-CM | POA: Diagnosis not present

## 2018-01-29 DIAGNOSIS — E1142 Type 2 diabetes mellitus with diabetic polyneuropathy: Secondary | ICD-10-CM | POA: Diagnosis not present

## 2018-01-29 DIAGNOSIS — Z833 Family history of diabetes mellitus: Secondary | ICD-10-CM | POA: Diagnosis not present

## 2018-01-29 DIAGNOSIS — Z794 Long term (current) use of insulin: Secondary | ICD-10-CM | POA: Insufficient documentation

## 2018-01-29 DIAGNOSIS — Z9889 Other specified postprocedural states: Secondary | ICD-10-CM | POA: Insufficient documentation

## 2018-01-29 DIAGNOSIS — E118 Type 2 diabetes mellitus with unspecified complications: Secondary | ICD-10-CM | POA: Diagnosis not present

## 2018-01-29 DIAGNOSIS — E785 Hyperlipidemia, unspecified: Secondary | ICD-10-CM | POA: Diagnosis not present

## 2018-01-29 DIAGNOSIS — G4733 Obstructive sleep apnea (adult) (pediatric): Secondary | ICD-10-CM | POA: Diagnosis not present

## 2018-01-29 DIAGNOSIS — R5383 Other fatigue: Secondary | ICD-10-CM | POA: Insufficient documentation

## 2018-01-29 DIAGNOSIS — G8929 Other chronic pain: Secondary | ICD-10-CM | POA: Diagnosis not present

## 2018-01-29 DIAGNOSIS — I130 Hypertensive heart and chronic kidney disease with heart failure and stage 1 through stage 4 chronic kidney disease, or unspecified chronic kidney disease: Secondary | ICD-10-CM | POA: Insufficient documentation

## 2018-01-29 DIAGNOSIS — Z79811 Long term (current) use of aromatase inhibitors: Secondary | ICD-10-CM | POA: Diagnosis not present

## 2018-01-29 DIAGNOSIS — G4700 Insomnia, unspecified: Secondary | ICD-10-CM | POA: Insufficient documentation

## 2018-01-29 DIAGNOSIS — E1122 Type 2 diabetes mellitus with diabetic chronic kidney disease: Secondary | ICD-10-CM | POA: Insufficient documentation

## 2018-01-29 DIAGNOSIS — Z9989 Dependence on other enabling machines and devices: Secondary | ICD-10-CM | POA: Diagnosis not present

## 2018-01-29 DIAGNOSIS — K279 Peptic ulcer, site unspecified, unspecified as acute or chronic, without hemorrhage or perforation: Secondary | ICD-10-CM | POA: Insufficient documentation

## 2018-01-29 LAB — POCT URINALYSIS DIPSTICK
BILIRUBIN UA: NEGATIVE
Blood, UA: NEGATIVE
GLUCOSE UA: POSITIVE — AB
Leukocytes, UA: NEGATIVE
Nitrite, UA: NEGATIVE
PH UA: 6 (ref 5.0–8.0)
Protein, UA: NEGATIVE
Spec Grav, UA: <=1.005 — AB (ref 1.010–1.025)
UROBILINOGEN UA: 0.2 U/dL

## 2018-01-29 LAB — POCT GLYCOSYLATED HEMOGLOBIN (HGB A1C): HbA1c, POC (controlled diabetic range): 10.2 % — AB (ref 0.0–7.0)

## 2018-01-29 LAB — GLUCOSE, POCT (MANUAL RESULT ENTRY)
POC Glucose: 417 mg/dl — AB (ref 70–99)
POC Glucose: 476 mg/dl — AB (ref 70–99)

## 2018-01-29 MED ORDER — INSULIN ASPART 100 UNIT/ML ~~LOC~~ SOLN
24.0000 [IU] | Freq: Once | SUBCUTANEOUS | Status: AC
  Administered 2018-01-29: 24 [IU] via SUBCUTANEOUS

## 2018-01-29 MED ORDER — DULOXETINE HCL 20 MG PO CPEP: 20.0000 mg | ORAL_CAPSULE | Freq: Every day | ORAL | 6 refills | Status: DC

## 2018-01-29 MED ORDER — INSULIN ASPART 100 UNIT/ML FLEXPEN: PEN_INJECTOR | SUBCUTANEOUS | 6 refills | Status: DC

## 2018-01-29 MED ORDER — INSULIN ASPART 100 UNIT/ML ~~LOC~~ SOLN
6.0000 [IU] | Freq: Once | SUBCUTANEOUS | Status: AC
  Administered 2018-01-29: 6 [IU] via SUBCUTANEOUS

## 2018-01-29 MED ORDER — INSULIN DETEMIR 100 UNIT/ML FLEXPEN: PEN_INJECTOR | SUBCUTANEOUS | 0 refills | Status: DC

## 2018-01-29 MED ORDER — INSULIN ASPART 100 UNIT/ML ~~LOC~~ SOLN: 6.0000 [IU] | Freq: Once | SUBCUTANEOUS | Status: DC

## 2018-01-29 MED ORDER — SODIUM CHLORIDE 0.9 % IV SOLN: INTRAVENOUS | Status: DC

## 2018-01-29 NOTE — Patient Instructions (Signed)
Increase Levemir to 78 units daily Increase Novolog to 23 units with breakfast, 40 units with lunch and 32 unis with dinner.  Stop eating fruitcup tail and cakes.  Eat fresh fruits for snack.  Star Cymbalta 20 mg daily.

## 2018-01-29 NOTE — Progress Notes (Signed)
Patient ID: Marco [name], male    DOB: [birth year]  MRN: [id number]  CC: Diabetes   Subjective: Marco [name] is a 62 y.o. male who presents for chronic ds management His concerns today include:  62 year old male with history of HTN,DM2 with neuropathy, CKD stage III, obesity, nonobstructive CAD, chronic combined CHF, OSA on CPAP, followed by rheumatology in [facility] for psoriasis/gout/nonspec arthritis. On narcotics through [facility] for chronic pain in chest/back/shoulders/neck. insomnia  DM: We have discontinued metformin because of declining eGFR from 53 in January to 45 last mth.  He has been meeting with the clinical pharmacist for titration of insulin.  Reports blood sugars have remained high. BS 130-150 in a.m, sometimes in 200. Before lunch and dinner 300-400 Blood sugar today in the clinic is over 400.  He ran out of NovoLog with last shot being last evening.  He plans to pick up his refill today.  He just ate lunch which consisted of fried chicken and some fries.   Eating habits:  Tries not to eat too much fast foods but he did have some today.  Usually bake his meats.   Admits that eats cakes and fruitcup at nights for snack -Meds: On Levemir 72 units daily and NovoLog 23 units breakfast, 35 units lunch and 27 units with dinner Feels tired all the time. Sleeping well at nights with Ambien.  Using CPAP consistently.  Feels rested in a.m but still feels tired during the day.  He wonders whether his increased blood sugars may be contributing.  + frequent thirst and urination.  On Furosemide  HTN: compliant with meds  DM neuropathy:  Referred to neurologist by rheumatologist due to pain in hands and feet - burning and stinging pain.  On max dose of Lyrica. Can not stand for sheet cover on his bed to touch his feet, very sensitive. -His rheumatologist is in [facility] and referred him to a neurologist in [facility].  However transportation is an issue.  He is wondering whether  he can be referred to a neurologist here locally in [facility] Patient Active Problem List   Diagnosis Date Noted  . Renal failure (ARF), acute on chronic (HCC) 08/27/2017  . Dysphagia 01/09/2017  . Hyperlipidemia 12/23/2016  . Chronic combined systolic and diastolic CHF (congestive heart failure) ([place]) 09/09/2016  . Diabetes mellitus with complication ([place])   . Diabetic retinopathy ([place]) 07/22/2016  . Diabetic polyneuropathy associated with type 2 diabetes mellitus ([place]) 06/10/2016  . Myalgia and myositis 03/31/2016  . Dyspnea on exertion 02/09/2016  . Chronic gouty arthritis 11/29/2015  . Ache in joint 11/27/2015  . LBP (low back pain) 11/27/2015  . Arthralgia of multiple joints 11/27/2015  . Psoriasis 11/27/2015  . Breast pain in male 11/14/2015  . Sinusitis, chronic 10/16/2015  . Insomnia 10/16/2015  . Adult BMI 30+ 10/04/2015  . New onset of headaches after age 76 09/20/2015  . OSA (obstructive sleep apnea) 07/12/2015  . Low serum testosterone level 05/18/2015  . Depression 05/16/2015  . Chronic pain   . CAD (coronary artery disease) 12/24/2014  . Atrial fibrillation ([place]) 12/24/2014  . Essential hypertension 12/24/2014  . CKD (chronic kidney disease) stage 3, GFR 30-59 ml/min (HCC) 12/24/2014  . PUD (peptic ulcer disease) 12/24/2014     Current Outpatient Medications on File Prior to Visit  Medication Sig Dispense Refill  . ACCU-CHEK AVIVA PLUS test strip USE ONE STRIP TO CHECK BLOOD GLUCOSE 3 TIMES DAILY 100 each 12  . ACCU-CHEK  FASTCLIX LANCETS MISC Use a directed to check blood sugar 3 times daily. 100 each 11  . allopurinol (ZYLOPRIM) 100 MG tablet Take 100 mg by mouth 2 (two) times daily.    [name] aspirin (EQ ASPIRIN ADULT LOW DOSE) 81 MG EC tablet Take 1 tablet (81 mg total) by mouth daily. Swallow whole. 90 tablet 3  . atorvastatin (LIPITOR) 40 MG tablet Take 1 tablet (40 mg total) by mouth daily. 90 tablet 3  . BD PEN NEEDLE NANO U/F 32G X 4 MM MISC USE ONE  THREE  TIMES DAILY 100 each 11  . Blood Glucose Monitoring Suppl (ACCU-CHEK AVIVA PLUS) w/Device KIT 1 Device by Does not apply route 3 (three) times daily after meals. E11.9 1 kit 0  . Blood Pressure KIT 1 each by Does not apply route 2 (two) times daily. 1 each 0  . cetirizine (ALL DAY ALLERGY) 10 MG tablet Take 1 tablet (10 mg total) by mouth daily. 30 tablet 3  . diclofenac sodium (VOLTAREN) 1 % GEL Apply 2 g topically 4 (four) times daily. Place over right knee for 5 days. 1 Tube 0  . furosemide (LASIX) 40 MG tablet Take 1 tablet by mouth every morning (Patient taking differently: Take 40 mg by mouth every morning. Take 1 tablet by mouth every morning) 90 tablet 3  . glucose blood (ACCU-CHEK AVIVA PLUS) test strip 1 each by Other route 3 (three) times daily. E11.9 100 each 12  . losartan (COZAAR) 100 MG tablet Take 1 tablet (100 mg total) by mouth daily. 30 tablet 4  . metFORMIN (GLUCOPHAGE-XR) 500 MG 24 hr tablet Take 2 tablets (1,000 mg total) by mouth 2 (two) times daily. 360 tablet 3  . predniSONE (DELTASONE) 5 MG tablet Take 5 mg by mouth daily.    . pregabalin (LYRICA) 100 MG capsule Take 1 capsule (100 mg total) by mouth 3 (three) times daily. 90 capsule 5  . ranitidine (ZANTAC) 150 MG tablet Take 1 tablet (150 mg total) by mouth at bedtime. (Patient taking differently: Take 150 mg by mouth daily before breakfast. ) 90 tablet 3  . Secukinumab (COSENTYX) 150 MG/ML SOSY Inject 150 mg into the skin every 28 (twenty-eight) days.     [name] zolpidem (AMBIEN CR) 6.25 MG CR tablet Take 1 tablet (6.25 mg total) by mouth at bedtime as needed for sleep. Each prescription to last 1 month. 30 tablet 1   No current facility-administered medications on file prior to visit.     No Known Allergies  Social History   Socioeconomic History  . Marital status: Married    Spouse name: Amethyst  . Number of children: 8  . Years of education: 73  . Highest education level: Not on file  Occupational History     Comment: disabled  Social Needs  . Financial resource strain: Not on file  . Food insecurity:    Worry: Not on file    Inability: Not on file  . Transportation needs:    Medical: Not on file    Non-medical: Not on file  Tobacco Use  . Smoking status: Former Smoker    Packs/day: 0.50    Years: 10.00    Pack years: 5.00    Types: Cigarettes    Last attempt to quit: 04/11/2008    Years since quitting: 9.8  . Smokeless tobacco: Never Used  Substance and Sexual Activity  . Alcohol use: No    Alcohol/week: 0.0 oz  . Drug use: No  .  Sexual activity: Yes    Partners: Female    Birth control/protection: None  Lifestyle  . Physical activity:    Days per week: Not on file    Minutes per session: Not on file  . Stress: Not on file  Relationships  . Social connections:    Talks on phone: Not on file    Gets together: Not on file    Attends religious service: Not on file    Active member of club or organization: Not on file    Attends meetings of clubs or organizations: Not on file    Relationship status: Not on file  . Intimate partner violence:    Fear of current or ex partner: Not on file    Emotionally abused: Not on file    Physically abused: Not on file    Forced sexual activity: Not on file  Other Topics Concern  . Not on file  Social History Narrative   Lives with wife. Does not work.  On disability.    Caffeine- coffee, 1/2 cup daily    Family History  Problem Relation Age of Onset  . Hypertension Mother   . Diabetes Mother   . COPD Mother   . Lung cancer Mother        Smoker   . Hypertension Father   . Diabetes Father   . Heart Problems Father   . COPD Father   . Colon cancer Neg Hx   . Stomach cancer Neg Hx     Past Surgical History:  Procedure Laterality Date  . CARDIAC CATHETERIZATION  05/2014   ablation for atrial fibrillation  . CARDIAC CATHETERIZATION N/A 11/16/2015   Procedure: Left Heart Cath and Coronary Angiography;  Surgeon: Leonie Man,  MD;  Location: [place] [facility];  Service: Cardiovascular;  Laterality: N/A;  . COLON RESECTION  09/2013   due to large, abnormal polpy. non cancerous per patient.   . COLON SURGERY  09/2014   colon resection     ROS: Review of Systems Negative except as stated above   PHYSICAL EXAM: BP 129/75   Pulse 86   Temp 98 F (36.7 C) (Oral)   Ht _0  (1.676 m)   Wt 221 lb 9.6 oz (100.5 kg)   SpO2 94%   BMI 35.77 kg/m   Wt Readings from Last 3 Encounters:  01/29/18 221 lb 9.6 oz (100.5 kg)  10/29/17 226 lb 3.2 oz (102.6 kg)  10/15/17 224 lb 12.8 oz (102 kg)    Physical Exam  General appearance - alert, well appearing, and in no distress Mental status - normal mood, behavior, speech, dress, motor activity, and thought processes Neck - supple, no significant adenopathy Chest - clear to auscultation, no wheezes, rales or rhonchi, symmetric air entry Heart - normal rate, regular rhythm, normal S1, S2, no murmurs, rubs, clicks or gallops Extremities -no lower extremity edema. Feet: Toenails are little overgrown.  Very dry skin on feet.  LEEP is abnormal on plantar soles.  Feet and lower third of the legs very sensitive to touch  Results for orders placed or performed in visit on 01/29/18  POCT glucose (manual entry)  Result Value Ref Range   POC Glucose 417 (A) 70 - 99 mg/dl  POCT glycosylated hemoglobin (Hb A1C)  Result Value Ref Range   Hemoglobin A1C  4.0 - 5.6 %   HbA1c POC (<> result, manual entry)  4.0 - 5.6 %   HbA1c, POC (prediabetic range)  5.7 -  6.4 %   HbA1c, POC (controlled diabetic range) 10.2 (A) 0.0 - 7.0 %  POCT urinalysis dipstick  Result Value Ref Range   Color, UA yellow    Clarity, UA clear    Glucose, UA Positive (A) Negative   Bilirubin, UA negative    Ketones, UA trace    Spec Grav, UA <=1.005 (A) 1.010 - 1.025   Blood, UA negative    pH, UA 6.0 5.0 - 8.0   Protein, UA Negative Negative   Urobilinogen, UA 0.2 0.2 or 1.0 E.U./dL   Nitrite, UA  negative    Leukocytes, UA Negative Negative   Appearance     Odor    POCT glucose (manual entry)  Result Value Ref Range   POC Glucose 476 (A) 70 - 99 mg/dl   A1C 10.2  ASSESSMENT AND PLAN: 1. Uncontrolled type 2 diabetes mellitus with peripheral neuropathy (HCC) -Blood sugars have gone out of control having withdrawn metformin.  But I think part of this also is due to dietary indiscretions.  Dietary counseling given. -Patient given a total of 30 units of NovoLog IM and about 800 cc of normal saline fluid in clinic today -Increase Levemir to 78 units daily.  Change NovoLog to 23 units breakfast, 40 units at lunch and 32 units with dinner. -Refer to endocrinology. -In regards to the neuropathy, we will try adding low-dose of Cymbalta to the Lyrica - POCT glucose (manual entry) - POCT glycosylated hemoglobin (Hb A1C) - insulin aspart (novoLOG) injection 24 Units - Ambulatory referral to Endocrinology - DULoxetine (CYMBALTA) 20 MG capsule; Take 1 capsule (20 mg total) by mouth daily.  Dispense: 30 capsule; Refill: 6 - Basic Metabolic Panel - Insulin Detemir (LEVEMIR FLEXTOUCH) 100 UNIT/ML Pen; Inject 78 units into the skin daily.  Dispense: 21 mL; Refill: 0 - insulin aspart (NOVOLOG FLEXPEN) 100 UNIT/ML FlexPen; Inject into the skin 23 units with breakfast, 40 units with lunch, and 32 units with dinner.  Dispense: 24 mL; Refill: 6 - POCT urinalysis dipstick - POCT glucose (manual entry) - insulin aspart (novoLOG) injection 6 Units  2. Essential hypertension At goal  3. Fatigue, unspecified type - TSH  4. OSA on CPAP    Patient was given the opportunity to ask questions.  Patient verbalized understanding of the plan and was able to repeat key elements of the plan.   Orders Placed This Encounter  Procedures  . Basic Metabolic Panel  . TSH  . Ambulatory referral to Endocrinology  . POCT glucose (manual entry)  . POCT glycosylated hemoglobin (Hb A1C)  . POCT urinalysis  dipstick  . POCT glucose (manual entry)     Requested Prescriptions   Signed Prescriptions Disp Refills  . DULoxetine (CYMBALTA) 20 MG capsule 30 capsule 6    Sig: Take 1 capsule (20 mg total) by mouth daily.  . Insulin Detemir (LEVEMIR FLEXTOUCH) 100 UNIT/ML Pen 21 mL 0    Sig: Inject 78 units into the skin daily.  . insulin aspart (NOVOLOG FLEXPEN) 100 UNIT/ML FlexPen 24 mL 6    Sig: Inject into the skin 23 units with breakfast, 40 units with lunch, and 32 units with dinner.    Return in about 3 weeks (around 02/19/2018).  Karle Plumber, MD, FACP

## 2018-01-31 ENCOUNTER — Other Ambulatory Visit: Payer: Self-pay | Admitting: Internal Medicine

## 2018-01-31 DIAGNOSIS — N183 Chronic kidney disease, stage 3 unspecified: Secondary | ICD-10-CM

## 2018-02-02 ENCOUNTER — Telehealth: Payer: Self-pay | Admitting: Internal Medicine

## 2018-02-02 LAB — BASIC METABOLIC PANEL
BUN / CREAT RATIO: 10 (ref 10–24)
BUN: 18 mg/dL (ref 8–27)
CO2: 25 mmol/L (ref 20–29)
CREATININE: 1.77 mg/dL — AB (ref 0.76–1.27)
Calcium: 8.8 mg/dL (ref 8.6–10.2)
Chloride: 93 mmol/L — ABNORMAL LOW (ref 96–106)
GFR, EST AFRICAN AMERICAN: 46 mL/min/{1.73_m2} — AB (ref >59)
GFR, EST NON AFRICAN AMERICAN: 40 mL/min/{1.73_m2} — AB (ref >59)
Glucose: 536 mg/dL (ref 65–99)
POTASSIUM: 3.7 mmol/L (ref 3.5–5.2)
SODIUM: 138 mmol/L (ref 134–144)

## 2018-02-02 LAB — TSH: TSH: 1.37 u[IU]/mL (ref 0.450–4.500)

## 2018-02-02 NOTE — Telephone Encounter (Signed)
Santiago Glad called from [facility] called with a critical lab report: glucose of [phone]

## 2018-02-03 ENCOUNTER — Other Ambulatory Visit: Payer: Self-pay

## 2018-02-03 DIAGNOSIS — I2510 Atherosclerotic heart disease of native coronary artery without angina pectoris: Secondary | ICD-10-CM

## 2018-02-03 NOTE — Telephone Encounter (Signed)
Will forward to pcp

## 2018-02-04 MED ORDER — ASPIRIN 81 MG PO TBEC: 81.0000 mg | DELAYED_RELEASE_TABLET | Freq: Every day | ORAL | 3 refills | Status: DC

## 2018-02-08 ENCOUNTER — Other Ambulatory Visit: Payer: Self-pay

## 2018-02-08 DIAGNOSIS — G4700 Insomnia, unspecified: Secondary | ICD-10-CM

## 2018-02-09 MED ORDER — ZOLPIDEM TARTRATE ER 6.25 MG PO TBCR: 6.2500 mg | EXTENDED_RELEASE_TABLET | Freq: Every evening | ORAL | 1 refills | Status: DC | PRN

## 2018-02-10 ENCOUNTER — Telehealth: Payer: Self-pay

## 2018-02-10 NOTE — Telephone Encounter (Signed)
Contacted pt and left a detailed vm informing pt that rx is ready up front

## 2018-02-19 ENCOUNTER — Other Ambulatory Visit: Payer: Self-pay | Admitting: Internal Medicine

## 2018-02-19 NOTE — Telephone Encounter (Signed)
Pt called to request a medication refill on  -predniSONE (DELTASONE) 5 MG tablet  To -[place] [place] [address] Please follow up

## 2018-02-22 ENCOUNTER — Encounter: Payer: Self-pay | Admitting: Internal Medicine

## 2018-02-22 ENCOUNTER — Ambulatory Visit: Payer: Medicaid Other | Attending: Internal Medicine | Admitting: Internal Medicine

## 2018-02-22 VITALS — BP 117/84 | HR 77 | Temp 98.2°F | Resp 12 | Wt 223.0 lb

## 2018-02-22 DIAGNOSIS — F5104 Psychophysiologic insomnia: Secondary | ICD-10-CM

## 2018-02-22 DIAGNOSIS — N183 Chronic kidney disease, stage 3 unspecified: Secondary | ICD-10-CM

## 2018-02-22 DIAGNOSIS — G4733 Obstructive sleep apnea (adult) (pediatric): Secondary | ICD-10-CM | POA: Insufficient documentation

## 2018-02-22 DIAGNOSIS — I4891 Unspecified atrial fibrillation: Secondary | ICD-10-CM | POA: Insufficient documentation

## 2018-02-22 DIAGNOSIS — Z79899 Other long term (current) drug therapy: Secondary | ICD-10-CM | POA: Diagnosis not present

## 2018-02-22 DIAGNOSIS — Z800 Family history of malignant neoplasm of digestive organs: Secondary | ICD-10-CM | POA: Insufficient documentation

## 2018-02-22 DIAGNOSIS — L4050 Arthropathic psoriasis, unspecified: Secondary | ICD-10-CM | POA: Insufficient documentation

## 2018-02-22 DIAGNOSIS — K219 Gastro-esophageal reflux disease without esophagitis: Secondary | ICD-10-CM | POA: Insufficient documentation

## 2018-02-22 DIAGNOSIS — M109 Gout, unspecified: Secondary | ICD-10-CM | POA: Insufficient documentation

## 2018-02-22 DIAGNOSIS — E669 Obesity, unspecified: Secondary | ICD-10-CM | POA: Insufficient documentation

## 2018-02-22 DIAGNOSIS — G4700 Insomnia, unspecified: Secondary | ICD-10-CM | POA: Diagnosis not present

## 2018-02-22 DIAGNOSIS — Z6835 Body mass index (BMI) 35.0-35.9, adult: Secondary | ICD-10-CM | POA: Insufficient documentation

## 2018-02-22 DIAGNOSIS — I5042 Chronic combined systolic (congestive) and diastolic (congestive) heart failure: Secondary | ICD-10-CM | POA: Diagnosis not present

## 2018-02-22 DIAGNOSIS — E118 Type 2 diabetes mellitus with unspecified complications: Secondary | ICD-10-CM

## 2018-02-22 DIAGNOSIS — Z8249 Family history of ischemic heart disease and other diseases of the circulatory system: Secondary | ICD-10-CM | POA: Diagnosis not present

## 2018-02-22 DIAGNOSIS — Z794 Long term (current) use of insulin: Secondary | ICD-10-CM | POA: Insufficient documentation

## 2018-02-22 DIAGNOSIS — I2510 Atherosclerotic heart disease of native coronary artery without angina pectoris: Secondary | ICD-10-CM | POA: Insufficient documentation

## 2018-02-22 DIAGNOSIS — E785 Hyperlipidemia, unspecified: Secondary | ICD-10-CM | POA: Diagnosis not present

## 2018-02-22 DIAGNOSIS — I130 Hypertensive heart and chronic kidney disease with heart failure and stage 1 through stage 4 chronic kidney disease, or unspecified chronic kidney disease: Secondary | ICD-10-CM | POA: Insufficient documentation

## 2018-02-22 DIAGNOSIS — E1122 Type 2 diabetes mellitus with diabetic chronic kidney disease: Secondary | ICD-10-CM | POA: Insufficient documentation

## 2018-02-22 DIAGNOSIS — F329 Major depressive disorder, single episode, unspecified: Secondary | ICD-10-CM | POA: Diagnosis not present

## 2018-02-22 DIAGNOSIS — Z7982 Long term (current) use of aspirin: Secondary | ICD-10-CM | POA: Diagnosis not present

## 2018-02-22 DIAGNOSIS — Z87891 Personal history of nicotine dependence: Secondary | ICD-10-CM | POA: Insufficient documentation

## 2018-02-22 DIAGNOSIS — G8929 Other chronic pain: Secondary | ICD-10-CM | POA: Diagnosis not present

## 2018-02-22 MED ORDER — RANITIDINE HCL 150 MG PO TABS: 150.0000 mg | ORAL_TABLET | Freq: Every day | ORAL | 1 refills | Status: DC

## 2018-02-22 MED ORDER — INSULIN ASPART 100 UNIT/ML FLEXPEN: PEN_INJECTOR | SUBCUTANEOUS | 6 refills | Status: DC

## 2018-02-22 MED ORDER — PREDNISONE 10 MG PO TABS: 10.0000 mg | ORAL_TABLET | Freq: Every day | ORAL | 1 refills | Status: DC

## 2018-02-22 MED ORDER — GLUCOSE BLOOD VI STRP: ORAL_STRIP | 12 refills | Status: DC

## 2018-02-22 NOTE — Progress Notes (Signed)
Back pain x 2 weeks constant Right knee x 2 weeks worse with activity No relief with aspere cream  No medications x 4 days: Ranitidine and prednisone. He also states he has not check cbg in that amount of time.

## 2018-02-22 NOTE — Progress Notes (Signed)
Patient ID: Marco [name], male    DOB: [birth year]  MRN: [id number]  CC: Back Pain and Knee Pain   Subjective: Marco [name] is a 62 y.o. male who presents for 4-wks f/u DM His concerns today include:  62 year old male with history of HTN,DM2 with neuropathy, CKD stage III, obesity, nonobstructive CAD, chronic combined CHF, OSA on CPAP, followed by rheumatology in [facility] for psoriasis/gout/nonspec arthritis. On narcotics through [facility] for chronic pain in chest/back/shoulders/neck. insomnia   Chronic insomnia: Doing okay on Ambien.  Needs prior authorization from his insurance for continued refills.  He reports compliance with his CPAP machine..   DM: Brings in blood sugar log for my review.  Blood sugars before breakfast have ranged 95-159, before lunch 1 43-236, and before dinner 222-281.  He is currently on Levemir 78 units and NovoLog 23 units breakfast, 40 units lunch and 32 units dinner.  He feels he is doing okay with his eating habits.  He has not received appointment as yet with endocrinology.  Neuropathy: On last visit we added a low-dose of Cymbalta to Lyrica for his neuropathy symptoms in his legs.  He reports that symptoms are better with this combination.   CKD stage III: BMP done on last visit revealed decrease in GFR from 53 to 46.  Referred to nephrology.  No appointment as yet.  He is complaining of stiffness and an increased pain in his lower back.  He has been out of prednisone for about a week.  He gets this from his rheumatologist but had requested from [place].  I have 5 mg listed on his med list but patient states that he is actually on 10 mg daily.  GERD: Requests refill on ranitidine.  Patient Active Problem List   Diagnosis Date Noted  . Renal failure (ARF), acute on chronic (HCC) 08/27/2017  . Dysphagia 01/09/2017  . Hyperlipidemia 12/23/2016  . Chronic combined systolic and diastolic CHF (congestive heart failure) ([place]) 09/09/2016  . Diabetes  mellitus with complication ([place])   . Diabetic retinopathy ([place]) 07/22/2016  . Diabetic polyneuropathy associated with type 2 diabetes mellitus ([place]) 06/10/2016  . Myalgia and myositis 03/31/2016  . Dyspnea on exertion 02/09/2016  . Chronic gouty arthritis 11/29/2015  . Ache in joint 11/27/2015  . LBP (low back pain) 11/27/2015  . Arthralgia of multiple joints 11/27/2015  . Psoriasis 11/27/2015  . Breast pain in male 11/14/2015  . Sinusitis, chronic 10/16/2015  . Insomnia 10/16/2015  . Adult BMI 30+ 10/04/2015  . New onset of headaches after age 51 09/20/2015  . OSA (obstructive sleep apnea) 07/12/2015  . Low serum testosterone level 05/18/2015  . Depression 05/16/2015  . Chronic pain   . CAD (coronary artery disease) 12/24/2014  . Atrial fibrillation ([place]) 12/24/2014  . Essential hypertension 12/24/2014  . CKD (chronic kidney disease) stage 3, GFR 30-59 ml/min (HCC) 12/24/2014  . PUD (peptic ulcer disease) 12/24/2014     Current Outpatient Medications on File Prior to Visit  Medication Sig Dispense Refill  . allopurinol (ZYLOPRIM) 100 MG tablet Take 100 mg by mouth 2 (two) times daily.    [name] aspirin (EQ ASPIRIN ADULT LOW DOSE) 81 MG EC tablet Take 1 tablet (81 mg total) by mouth daily. Swallow whole. 90 tablet 3  . atorvastatin (LIPITOR) 40 MG tablet Take 1 tablet (40 mg total) by mouth daily. 90 tablet 3  . cetirizine (ALL DAY ALLERGY) 10 MG tablet Take 1 tablet (10 mg total) by mouth daily. [place]  tablet 3  . DULoxetine (CYMBALTA) 20 MG capsule Take 1 capsule (20 mg total) by mouth daily. 30 capsule 6  . furosemide (LASIX) 40 MG tablet Take 1 tablet by mouth every morning (Patient taking differently: Take 40 mg by mouth every morning. Take 1 tablet by mouth every morning) 90 tablet 3  . Insulin Detemir (LEVEMIR FLEXTOUCH) 100 UNIT/ML Pen Inject 78 units into the skin daily. 21 mL 0  . losartan (COZAAR) 100 MG tablet Take 1 tablet (100 mg total) by mouth daily. 30 tablet 4  .  pregabalin (LYRICA) 100 MG capsule Take 1 capsule (100 mg total) by mouth 3 (three) times daily. 90 capsule 5  . Secukinumab (COSENTYX) 150 MG/ML SOSY Inject 150 mg into the skin every 28 (twenty-eight) days.     [name] zolpidem (AMBIEN CR) 6.25 MG CR tablet Take 1 tablet (6.25 mg total) by mouth at bedtime as needed for sleep. Each prescription to last 1 month. 30 tablet 1  . ACCU-CHEK FASTCLIX LANCETS MISC Use a directed to check blood sugar 3 times daily. (Patient not taking: Reported on 02/22/2018) 100 each 11  . BD PEN NEEDLE NANO U/F 32G X 4 MM MISC USE ONE  THREE TIMES DAILY 100 each 11  . Blood Glucose Monitoring Suppl (ACCU-CHEK AVIVA PLUS) w/Device KIT 1 Device by Does not apply route 3 (three) times daily after meals. E11.9 1 kit 0  . Blood Pressure KIT 1 each by Does not apply route 2 (two) times daily. 1 each 0  . diclofenac sodium (VOLTAREN) 1 % GEL Apply 2 g topically 4 (four) times daily. Place over right knee for 5 days. (Patient not taking: Reported on 02/22/2018) 1 Tube 0  . glucose blood (ACCU-CHEK AVIVA PLUS) test strip 1 each by Other route 3 (three) times daily. E11.9 100 each 12   Current Facility-Administered Medications on File Prior to Visit  Medication Dose Route Frequency Provider Last Rate Last Dose  . 0.9 %  sodium chloride infusion   Intravenous Continuous [name], MD        No Known Allergies  Social History   Socioeconomic History  . Marital status: Married    Spouse name: Amethyst  . Number of children: 8  . Years of education: 52  . Highest education level: Not on file  Occupational History    Comment: disabled  Social Needs  . Financial resource strain: Not on file  . Food insecurity:    Worry: Not on file    Inability: Not on file  . Transportation needs:    Medical: Not on file    Non-medical: Not on file  Tobacco Use  . Smoking status: Former Smoker    Packs/day: 0.50    Years: 10.00    Pack years: 5.00    Types: Cigarettes    Last  attempt to quit: 04/11/2008    Years since quitting: 9.8  . Smokeless tobacco: Never Used  Substance and Sexual Activity  . Alcohol use: No    Alcohol/week: 0.0 oz  . Drug use: No  . Sexual activity: Yes    Partners: Female    Birth control/protection: None  Lifestyle  . Physical activity:    Days per week: Not on file    Minutes per session: Not on file  . Stress: Not on file  Relationships  . Social connections:    Talks on phone: Not on file    Gets together: Not on file    Attends  religious service: Not on file    Active member of club or organization: Not on file    Attends meetings of clubs or organizations: Not on file    Relationship status: Not on file  . Intimate partner violence:    Fear of current or ex partner: Not on file    Emotionally abused: Not on file    Physically abused: Not on file    Forced sexual activity: Not on file  Other Topics Concern  . Not on file  Social History Narrative   Lives with wife. Does not work.  On disability.    Caffeine- coffee, 1/2 cup daily    Family History  Problem Relation Age of Onset  . Hypertension Mother   . Diabetes Mother   . COPD Mother   . Lung cancer Mother        Smoker   . Hypertension Father   . Diabetes Father   . Heart Problems Father   . COPD Father   . Colon cancer Neg Hx   . Stomach cancer Neg Hx     Past Surgical History:  Procedure Laterality Date  . CARDIAC CATHETERIZATION  05/2014   ablation for atrial fibrillation  . CARDIAC CATHETERIZATION N/A 11/16/2015   Procedure: Left Heart Cath and Coronary Angiography;  Surgeon: Leonie Man, MD;  Location: [place] [facility];  Service: Cardiovascular;  Laterality: N/A;  . COLON RESECTION  09/2013   due to large, abnormal polpy. non cancerous per patient.   . COLON SURGERY  09/2014   colon resection     ROS: Review of Systems Negative except as stated above. PHYSICAL EXAM: BP 117/84 (BP Location: Left Arm, Patient Position: Sitting, Cuff  Size: Large)   Pulse 77   Temp 98.2 F (36.8 C) (Oral)   Resp 12   Wt 223 lb (101.2 kg)   BMI 35.99 kg/m   Physical Exam General appearance - alert, well appearing.  He seems a little stiff and slow with movement due to pain in the lower back. Mental status - normal mood, behavior, speech, dress, motor activity, and thought processes Chest - clear to auscultation, no wheezes, rales or rhonchi, symmetric air entry Heart - normal rate, regular rhythm, normal S1, S2, no murmurs, rubs, clicks or gallops Extremities -no lower extremity edema.  ASSESSMENT AND PLAN: 1. Diabetes mellitus with complication ([place]) Improving but still not at goal.  Continue Levemir at 78 units daily.  Change NovoLog to 23/44/36 units Continue to encourage healthy eating habits - POCT glucose (manual entry) - glucose blood (ACCU-CHEK AVIVA PLUS) test strip; USE ONE STRIP TO CHECK BLOOD GLUCOSE 3 TIMES DAILY  Dispense: 100 each; Refill: 12 - insulin aspart (NOVOLOG FLEXPEN) 100 UNIT/ML FlexPen; Inject into the skin 23 units with breakfast, 44 units with lunch, and 36 units with dinner.  Dispense: 24 mL; Refill: 6  2. Chronic insomnia -Seen by sleep specialist earlier this year.  Patient has delayed sleep phase syndrome/circadian rhythm disorder.  Doing okay on Ambien.  Message sent to clinical pharmacist to try to get prior approval for his Ambien.  3. CKD (chronic kidney disease) stage 3, GFR 30-59 ml/min (HCC) Kidney health discussed.  Need for good diabetes and blood pressure control.  Avoid NSAIDs.  4. Gastroesophageal reflux disease without esophagitis - ranitidine (ZANTAC) 150 MG tablet; Take 1 tablet (150 mg total) by mouth daily before breakfast.  Dispense: 90 tablet; Refill: 1  5. 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[place]) Will give a refill on  prednisone until he can get in contact with his rheumatologist for further refills. - predniSONE (DELTASONE) 10 MG tablet; Take 1 tablet (10 mg total) by mouth daily.   Dispense: 30 tablet; Refill: 1  Patient was given the opportunity to ask questions.  Patient verbalized understanding of the plan and was able to repeat key elements of the plan.   Orders Placed This Encounter  Procedures  . POCT glucose (manual entry)     Requested Prescriptions   Signed Prescriptions Disp Refills  . glucose blood (ACCU-CHEK AVIVA PLUS) test strip 100 each 12    Sig: USE ONE STRIP TO CHECK BLOOD GLUCOSE 3 TIMES DAILY  . ranitidine (ZANTAC) 150 MG tablet 90 tablet 1    Sig: Take 1 tablet (150 mg total) by mouth daily before breakfast.  . predniSONE (DELTASONE) 10 MG tablet 30 tablet 1    Sig: Take 1 tablet (10 mg total) by mouth daily.  . insulin aspart (NOVOLOG FLEXPEN) 100 UNIT/ML FlexPen 24 mL 6    Sig: Inject into the skin 23 units with breakfast, 44 units with lunch, and 36 units with dinner.    Return in about 3 months (around 05/25/2018).  Karle Plumber, MD, FACP

## 2018-02-24 LAB — GLUCOSE, POCT (MANUAL RESULT ENTRY): POC GLUCOSE: 157 mg/dL — AB (ref 70–99)

## 2018-03-01 ENCOUNTER — Other Ambulatory Visit: Payer: Self-pay

## 2018-03-01 DIAGNOSIS — I2510 Atherosclerotic heart disease of native coronary artery without angina pectoris: Secondary | ICD-10-CM

## 2018-03-01 MED ORDER — ATORVASTATIN CALCIUM 40 MG PO TABS: 40.0000 mg | ORAL_TABLET | Freq: Every day | ORAL | 0 refills | Status: DC

## 2018-03-09 ENCOUNTER — Other Ambulatory Visit: Payer: Self-pay | Admitting: Cardiology

## 2018-03-09 DIAGNOSIS — I10 Essential (primary) hypertension: Secondary | ICD-10-CM

## 2018-03-09 DIAGNOSIS — E119 Type 2 diabetes mellitus without complications: Secondary | ICD-10-CM

## 2018-03-09 DIAGNOSIS — Z794 Long term (current) use of insulin: Secondary | ICD-10-CM

## 2018-03-10 ENCOUNTER — Other Ambulatory Visit: Payer: Self-pay

## 2018-03-10 MED ORDER — LOSARTAN POTASSIUM 100 MG PO TABS: 100.0000 mg | ORAL_TABLET | Freq: Every day | ORAL | 2 refills | Status: DC

## 2018-03-25 ENCOUNTER — Other Ambulatory Visit: Payer: Self-pay | Admitting: Cardiology

## 2018-03-25 DIAGNOSIS — I5043 Acute on chronic combined systolic (congestive) and diastolic (congestive) heart failure: Secondary | ICD-10-CM

## 2018-03-25 NOTE — Telephone Encounter (Signed)
Pharmacy requesting refill for this medication, however patient's chart/LOV note states two different sigs, please advise on how to proceed.

## 2018-03-25 NOTE — Telephone Encounter (Signed)
Left message to call back to clarify dose pt is taking.

## 2018-03-29 ENCOUNTER — Ambulatory Visit: Payer: Medicaid Other | Admitting: Internal Medicine

## 2018-03-30 ENCOUNTER — Other Ambulatory Visit: Payer: Self-pay | Admitting: Internal Medicine

## 2018-03-30 DIAGNOSIS — IMO0002 Reserved for concepts with insufficient information to code with codable children: Secondary | ICD-10-CM

## 2018-03-30 DIAGNOSIS — E1142 Type 2 diabetes mellitus with diabetic polyneuropathy: Secondary | ICD-10-CM

## 2018-03-30 DIAGNOSIS — E1165 Type 2 diabetes mellitus with hyperglycemia: Principal | ICD-10-CM

## 2018-03-31 ENCOUNTER — Other Ambulatory Visit: Payer: Self-pay

## 2018-03-31 ENCOUNTER — Other Ambulatory Visit: Payer: Self-pay | Admitting: Internal Medicine

## 2018-03-31 DIAGNOSIS — I2510 Atherosclerotic heart disease of native coronary artery without angina pectoris: Secondary | ICD-10-CM

## 2018-03-31 DIAGNOSIS — R9439 Abnormal result of other cardiovascular function study: Secondary | ICD-10-CM

## 2018-03-31 DIAGNOSIS — I5042 Chronic combined systolic (congestive) and diastolic (congestive) heart failure: Secondary | ICD-10-CM

## 2018-03-31 MED ORDER — FUROSEMIDE 40 MG PO TABS: ORAL_TABLET | ORAL | 0 refills | Status: DC

## 2018-04-01 NOTE — Telephone Encounter (Signed)
Outpatient Medication Detail    Disp Refills Start End   furosemide (LASIX) 40 MG tablet 30 tablet 0 03/31/2018    Sig: Take 1 tablet by mouth every morning. Keep follow up office visit to receive further refills. Thank you.   Sent to pharmacy as: furosemide (LASIX) 40 MG tablet   Notes to Pharmacy: Patient needs to keep follow up office visit to receive further refills. Thanks   E-Prescribing Status: Receipt confirmed by pharmacy (03/31/2018 2:23 PM EDT)   Associated Diagnoses   Chronic combined systolic and diastolic CHF (congestive heart failure) ([place])     Coronary artery disease involving native coronary artery of native heart without angina pectoris     Abnormal finding on cardiovascular stress test     Pharmacy   [facility], [place]

## 2018-04-01 NOTE — Telephone Encounter (Signed)
Mindy,  Looks like someone sent this in yesterday.  Wasn't sure how to get rid of this in a manner where he will still get the prescription.  Can you help me out?

## 2018-04-05 ENCOUNTER — Other Ambulatory Visit: Payer: Self-pay

## 2018-04-07 ENCOUNTER — Encounter: Payer: Self-pay | Admitting: Cardiology

## 2018-04-07 ENCOUNTER — Ambulatory Visit: Payer: Medicaid Other | Admitting: Cardiology

## 2018-04-07 VITALS — BP 160/88 | HR 68 | Ht 66.0 in | Wt 223.6 lb

## 2018-04-07 DIAGNOSIS — I481 Persistent atrial fibrillation: Secondary | ICD-10-CM | POA: Diagnosis not present

## 2018-04-07 DIAGNOSIS — I10 Essential (primary) hypertension: Secondary | ICD-10-CM

## 2018-04-07 DIAGNOSIS — E7800 Pure hypercholesterolemia, unspecified: Secondary | ICD-10-CM

## 2018-04-07 DIAGNOSIS — I5042 Chronic combined systolic (congestive) and diastolic (congestive) heart failure: Secondary | ICD-10-CM

## 2018-04-07 DIAGNOSIS — I2510 Atherosclerotic heart disease of native coronary artery without angina pectoris: Secondary | ICD-10-CM

## 2018-04-07 DIAGNOSIS — I4819 Other persistent atrial fibrillation: Secondary | ICD-10-CM

## 2018-04-07 DIAGNOSIS — R0602 Shortness of breath: Secondary | ICD-10-CM

## 2018-04-07 DIAGNOSIS — R0609 Other forms of dyspnea: Secondary | ICD-10-CM

## 2018-04-07 MED ORDER — FUROSEMIDE 40 MG PO TABS: ORAL_TABLET | ORAL | 0 refills | Status: DC

## 2018-04-07 MED ORDER — FUROSEMIDE 20 MG PO TABS: 20.0000 mg | ORAL_TABLET | Freq: Every day | ORAL | 3 refills | Status: DC

## 2018-04-07 NOTE — Progress Notes (Signed)
Cardiology Office Note:    Date:  04/07/2018   ID:  Marco [name], DOB 18-Nov-1955, MRN [id number]  PCP:  [name], MD  Cardiologist:  No primary care provider on file.    Referring MD: [name], MD   Chief Complaint  Patient presents with  . Coronary Artery Disease  . Hypertension  . Atrial Fibrillation  . Hyperlipidemia  . Congestive Heart Failure    History of Present Illness:    Marco [name] is a 62 y.o. male with a hx of DM, HTN, non-obstructive CAD by cath 2017, persistent atrial fibrillation (s/p RFCA in [place] 2017, Xarelto previousy d/c'd 2/2 anemia and unknown source of blood loss), chronic combined CHF (EF 45-50% by echo 08/2016), morbid obesity, OSA (PAP  intolerance), CKD III .  2D echo 09/03/16 showed EF 45-50% with global HK, grade 2 DD, elevated LV filling pressure.   He has chronic dyspnea on exertion which is felt to be multifactorial secondary to sedentary state, obesity and CHF.  He had a CTA 09/02/16 negative for PE.  Nuclear stress test was done for atypical CP 01/01/17 showing small defect of severe severity in apical septal and apex location, no ischemia, EF 44% - suspected artifact. Limited echo 01/20/17 showed EF 50-55%, hypokinesis of apical inferoseptal myocardium, grade 1 diastolic dysfunction, mild MR, no sig change from prior.     He no longer exercises due to dyspnea and is very sedentary. He did smoke for 20 years; quit in 2009. He was referred to cardiac rehab due to possibility that his symptoms were related to deconditioning as well as referred back to his pulmonary MD due to abnormal PFTs with possible interstitial lung problem.   Cardiopulmonary stress test was done 2 years ago that was not completed as the patient felt uncomfortable and did not like the equipment that he was connected to.  He is here today for followup and is doing well.  He continues to have chronic dyspnea on exertion with any type of exertion which he thinks is gotten  worse.  He continues to have chronic sharp chest pain that has not changed any from the past.  Denies any  PND, orthopnea, dizziness, palpitations or syncope.  Occasionally he will have some lower extremity edema but this is well controlled with diuretics and usually occurs only after he is been standing or driving for long periods of time.  He says that he is urinating too much during the day and is going about every 20 minutes and would like to cut back on his Lasix dose.  He is compliant with his meds and is tolerating meds with no SE.      Past Medical History:  Diagnosis Date  . Benign colon polyp 08/01/2013   [facility] in [place]. large base tranverse colon polyp was biopsied. polyp was benign with minimal surface hyperplastic change.  . Chronic combined systolic and diastolic CHF (congestive heart failure) ([place])   . Chronic pain   . CKD (chronic kidney disease), stage III ([place])   . Diabetes mellitus without complication ([place])   . Diverticulosis   . Esophageal hiatal hernia 07/29/2013   confirmed on EGD   . Esophageal stricture   . Essential hypertension   . Gastritis 07/29/2013   confirmed on EGD, bx done an negative for intestinal metaplasia, dsyplasia or H. pylori. normal gastric emptying study done 07/13/2013.  [name] GERD (gastroesophageal reflux disease)   . Morbid obesity ([place])   . Non-obstructive  CAD    a. 02/2013 Cath ([place]): nonobs dzs;  b. 09/2014 Myoview ([place]): EF 55%, no ischemia;  c. 12/2014 Echo: EF 50-55%, gr1 DD, no effusion.  . Persistent atrial fibrillation ([place])    a. 02/2013 s/p rfca in [place], [place]-->prev on Xarelto, d/c'd 2/2 anemia, ? GIB.  [name] Rheumatoid arthritis ([place])   . Sigmoid diverticulosis 08/01/2013   confirmed on colonscopy. record scanned into chart    Past Surgical History:  Procedure Laterality Date  . CARDIAC CATHETERIZATION  05/2014   ablation for atrial fibrillation  . CARDIAC CATHETERIZATION N/A 11/16/2015   Procedure: Left Heart Cath  and Coronary Angiography;  Surgeon: Leonie Man, MD;  Location: [facility];  Service: Cardiovascular;  Laterality: N/A;  . COLON RESECTION  09/2013   due to large, abnormal polpy. non cancerous per patient.   . COLON SURGERY  09/2014   colon resection     Current Medications: Current Meds  Medication Sig  . ACCU-CHEK FASTCLIX LANCETS MISC Use a directed to check blood sugar 3 times daily.  [name] aspirin (EQ ASPIRIN ADULT LOW DOSE) 81 MG EC tablet Take 1 tablet (81 mg total) by mouth daily. Swallow whole.  [name] atorvastatin (LIPITOR) 40 MG tablet Take 1 tablet (40 mg total) by mouth daily.  . BD PEN NEEDLE NANO U/F 32G X 4 MM MISC USE ONE  THREE TIMES DAILY  . Blood Glucose Monitoring Suppl (ACCU-CHEK AVIVA PLUS) w/Device KIT 1 Device by Does not apply route 3 (three) times daily after meals. E11.9  . Blood Pressure KIT 1 each by Does not apply route 2 (two) times daily.  . cetirizine (ALL DAY ALLERGY) 10 MG tablet Take 1 tablet (10 mg total) by mouth daily.  . diclofenac sodium (VOLTAREN) 1 % GEL Apply 2 g topically 4 (four) times daily. Place over right knee for 5 days.  . furosemide (LASIX) 40 MG tablet Take 1 tablet by mouth every morning. Keep follow up office visit to receive further refills. Thank you.  [name] glucose blood (ACCU-CHEK AVIVA PLUS) test strip 1 each by Other route 3 (three) times daily. E11.9  . glucose blood (ACCU-CHEK AVIVA PLUS) test strip USE ONE STRIP TO CHECK BLOOD GLUCOSE 3 TIMES DAILY  . HYDROcodone-acetaminophen (NORCO) 7.5-325 MG tablet Take 1 tablet by mouth 3 (three) times daily.  . insulin aspart (NOVOLOG FLEXPEN) 100 UNIT/ML FlexPen Inject into the skin 23 units with breakfast, 44 units with lunch, and 36 units with dinner.  [name] LEVEMIR FLEXTOUCH 100 UNIT/ML Pen INJECT 78 UNITS SUBCUTANEOUSLY ONCE DAILY  . losartan (COZAAR) 100 MG tablet Take 1 tablet (100 mg total) by mouth daily.  . predniSONE (DELTASONE) 10 MG tablet Take 1 tablet (10 mg total) by mouth  daily.  . pregabalin (LYRICA) 100 MG capsule Take 1 capsule (100 mg total) by mouth 3 (three) times daily.  . ranitidine (ZANTAC) 150 MG tablet Take 1 tablet (150 mg total) by mouth daily before breakfast.  . Secukinumab (COSENTYX) 150 MG/ML SOSY Inject 150 mg into the skin every 28 (twenty-eight) days.   [name] ULORIC 40 MG tablet Take 40 mg by mouth daily.  [name] zolpidem (AMBIEN CR) 6.25 MG CR tablet Take 1 tablet (6.25 mg total) by mouth at bedtime as needed for sleep. Each prescription to last 1 month.  . [DISCONTINUED] allopurinol (ZYLOPRIM) 100 MG tablet Take 100 mg by mouth 2 (two) times daily.  . [DISCONTINUED] DULoxetine (CYMBALTA) 20 MG capsule Take 1 capsule (20 mg  total) by mouth daily.  . [DISCONTINUED] furosemide (LASIX) 40 MG tablet Take 1 tablet by mouth every morning. Keep follow up office visit to receive further refills. Thank you.   Current Facility-Administered Medications for the 04/07/18 encounter (Office Visit) with Sueanne Margarita, MD  Medication  . 0.9 %  sodium chloride infusion     Allergies:   Patient has no known allergies.   Social History   Socioeconomic History  . Marital status: Married    Spouse name: Amethyst  . Number of children: 8  . Years of education: 51  . Highest education level: Not on file  Occupational History    Comment: disabled  Social Needs  . Financial resource strain: Not on file  . Food insecurity:    Worry: Not on file    Inability: Not on file  . Transportation needs:    Medical: Not on file    Non-medical: Not on file  Tobacco Use  . Smoking status: Former Smoker    Packs/day: 0.50    Years: 10.00    Pack years: 5.00    Types: Cigarettes    Last attempt to quit: 04/11/2008    Years since quitting: 9.9  . Smokeless tobacco: Never Used  Substance and Sexual Activity  . Alcohol use: No    Alcohol/week: 0.0 standard drinks  . Drug use: No  . Sexual activity: Yes    Partners: Female    Birth control/protection: None  Lifestyle   . Physical activity:    Days per week: Not on file    Minutes per session: Not on file  . Stress: Not on file  Relationships  . Social connections:    Talks on phone: Not on file    Gets together: Not on file    Attends religious service: Not on file    Active member of club or organization: Not on file    Attends meetings of clubs or organizations: Not on file    Relationship status: Not on file  Other Topics Concern  . Not on file  Social History Narrative   Lives with wife. Does not work.  On disability.    Caffeine- coffee, 1/2 cup daily     Family History: The patient's family history includes COPD in his father and mother; Diabetes in his father and mother; Heart Problems in his father; Hypertension in his father and mother; Lung cancer in his mother. There is no history of Colon cancer or Stomach cancer.  ROS:   Please see the history of present illness.    ROS  All other systems reviewed and negative.   EKGs/Labs/Other Studies Reviewed:    The following studies were reviewed today: none  EKG:  EKG is not ordered today.    Recent Labs: 08/27/2017: ALT 20 08/28/2017: Hemoglobin 11.3; Platelets 277 01/29/2018: BUN 18; Creatinine, Ser 1.77; Potassium 3.7; Sodium 138; TSH 1.370   Recent Lipid Panel    Component Value Date/Time   CHOL 104 04/17/2017 1221   TRIG 176 (H) 04/17/2017 1221   HDL 36 (L) 04/17/2017 1221   CHOLHDL 2.9 04/17/2017 1221   CHOLHDL 2.6 06/10/2016 1253   VLDL 18 06/10/2016 1253   LDLCALC 33 04/17/2017 1221    Physical Exam:    VS:  BP (!) 160/88   Pulse 68   Ht '5\' 6"'$  (1.676 m)   Wt 223 lb 9.6 oz (101.4 kg)   SpO2 98%   BMI 36.09 kg/m     Wt Readings  from Last 3 Encounters:  04/07/18 223 lb 9.6 oz (101.4 kg)  02/22/18 223 lb (101.2 kg)  01/29/18 221 lb 9.6 oz (100.5 kg)     GEN:  Well nourished, well developed in no acute distress HEENT: Normal NECK: No JVD; No carotid bruits LYMPHATICS: No lymphadenopathy CARDIAC: RRR, no  murmurs, rubs, gallops RESPIRATORY:  Clear to auscultation without rales, wheezing or rhonchi  ABDOMEN: Soft, non-tender, non-distended MUSCULOSKELETAL:  No edema; No deformity  SKIN: Warm and dry NEUROLOGIC:  Alert and oriented x 3 PSYCHIATRIC:  Normal affect   ASSESSMENT:    1. Coronary artery disease involving native coronary artery of native heart without angina pectoris   2. Chronic combined systolic and diastolic CHF (congestive heart failure) ([place])   3. Essential hypertension   4. Persistent atrial fibrillation ([place])   5. Pure hypercholesterolemia   6. Dyspnea on exertion    PLAN:    In order of problems listed above:  1.  ASCAD -  nonobstructive by cath with luminal irregularities up to 35% in LAD.  He continues to have chronic atypical chest pain and shortness of breath.  His chest pain is sharp and likely related to MSK etiology.   He saw GI and had a barium swallow which was normal with no GERD.  I do not think his symptoms are related to coronary ischemia as prior cath 2017 showed minimal dx and nuclear stress test showed no ischemia.   He will continue on statin and ASA.   2.  Chronic combined systolic/diastolic CHF - his last EF was 50-55% with grade 1 DD. He appears euvolemic on exam today and weight is stable.    He wants to decrease his Lasix because of frequent recommend him to take his Lasix to 20 mg daily but he will need to call me if he starts having more lower extremity edema or shortness of breath.  He will continue on his  losartan 100 mg daily.  3.   HTN - BP is adequately controlled on exam today.  He will continue on losartan 100 mg daily.  Creatinine was stable at 1.77 and potassium 3.7 on 01/29/2018. I will repeat BMET today.    4.   Persistent atrial fibrillation s/p RFCA in [place] 2017- he is maintaining NSR on exam today. He was on Xarelto in the past but stopped due to anemia.  I will have him see GI to make sure we are ok with restarting his NOAC as his  CHADS2VASC score is 4.  5.   Hyperlipidemia with LDL < 70.    He will continue on atorvastatin 40 mg daily.  His LDL was 33 on 04/17/2017.  I will repeat an FLP and ALT.  6.  Chronic DOE -felt to be multifactorial from obesity, deconditioning with sedentary state, CHF (this appears stable).  He had a cardiopulmonary stress test done last fall but could not finish it due to complaints that is was uncomfortable and did not like the equipment he was connected to.   Event monitor showed occasional PVC's.    He is followed by pulmonary miss his appointment with Dr. Annamaria Boots last week.  I am going to get him back into see him since he thinks his shortness of breath is worse.  I will repeat a 2D echocardiogram to make sure LV function has not changed.   Medication Adjustments/Labs and Tests Ordered: Current medicines are reviewed at length with the patient today.  Concerns regarding medicines are outlined above.  No orders of the defined types were placed in this encounter.  Meds ordered this encounter  Medications  . furosemide (LASIX) 40 MG tablet    Sig: Take 1 tablet by mouth every morning. Keep follow up office visit to receive further refills. Thank you.    Dispense:  30 tablet    Refill:  0    Patient needs to keep follow up office visit to receive further refills. Thanks    Signed, [name], MD  04/07/2018 9:26 AM    [place]

## 2018-04-07 NOTE — Patient Instructions (Addendum)
Medication Instructions:  Decrease lasix to 20 mg, once a day, by mouth  Labwork: Future: In 1 week return for fasting labs: BMET, LFT and Lipids   Testing/Procedures: Your physician has requested that you have an echocardiogram. Echocardiography is a painless test that uses sound waves to create images of your heart. It provides your doctor with information about the size and shape of your heart and how well your heart's chambers and valves are working. This procedure takes approximately one hour. There are no restrictions for this procedure.  Follow-Up: Your physician wants you to follow-up in: 6 months with PA. You will receive a reminder letter in the mail two months in advance. If you don't receive a letter, please call our office to schedule the follow-up appointment.  Your physician recommends that you schedule a follow-up appointment in: Dr. Annamaria Boots, Pulmonology   If you need a refill on your cardiac medications before your next appointment, please call your pharmacy.

## 2018-04-13 ENCOUNTER — Other Ambulatory Visit: Payer: Self-pay | Admitting: Internal Medicine

## 2018-04-13 DIAGNOSIS — I2510 Atherosclerotic heart disease of native coronary artery without angina pectoris: Secondary | ICD-10-CM

## 2018-04-14 ENCOUNTER — Ambulatory Visit (INDEPENDENT_AMBULATORY_CARE_PROVIDER_SITE_OTHER): Payer: Medicaid Other | Admitting: Endocrinology

## 2018-04-14 ENCOUNTER — Encounter: Payer: Self-pay | Admitting: Endocrinology

## 2018-04-14 VITALS — BP 162/98 | HR 83 | Wt 216.8 lb

## 2018-04-14 DIAGNOSIS — E119 Type 2 diabetes mellitus without complications: Secondary | ICD-10-CM

## 2018-04-14 DIAGNOSIS — E1165 Type 2 diabetes mellitus with hyperglycemia: Secondary | ICD-10-CM | POA: Diagnosis not present

## 2018-04-14 DIAGNOSIS — E1142 Type 2 diabetes mellitus with diabetic polyneuropathy: Secondary | ICD-10-CM

## 2018-04-14 DIAGNOSIS — E118 Type 2 diabetes mellitus with unspecified complications: Secondary | ICD-10-CM | POA: Diagnosis not present

## 2018-04-14 DIAGNOSIS — IMO0002 Reserved for concepts with insufficient information to code with codable children: Secondary | ICD-10-CM

## 2018-04-14 LAB — POCT GLYCOSYLATED HEMOGLOBIN (HGB A1C): Hemoglobin A1C: 8.8 % — AB (ref 4.0–5.6)

## 2018-04-14 MED ORDER — INSULIN DETEMIR 100 UNIT/ML FLEXPEN: 180.0000 [IU] | PEN_INJECTOR | SUBCUTANEOUS | 2 refills | Status: DC

## 2018-04-14 MED ORDER — GLUCOSE BLOOD VI STRP: 1.0000 | ORAL_STRIP | Freq: Two times a day (BID) | 3 refills | Status: DC

## 2018-04-14 NOTE — Progress Notes (Signed)
Subjective:    Patient ID: Marco [name], male    DOB: February 12, 1956, 62 y.o.   MRN: [id number]  HPI pt is referred by Dr Wynetta Emery, for diabetes.  Pt states DM was dx'ed in 2009; he has severe neuropathy of the lower extremities, and assoc pain; he also has CAD, NPDR, and renal failure; he has been on insulin since soon after dx; pt says his diet and exercise are poor; he has never had pancreatitis, pancreatic surgery, severe hypoglycemia or DKA.  he chronically takes prednisone, for psoriasis, with no plan for taper.  He takes levemir 78 units qhs, and novolog 3 times a day (just before each meal) 23-44-36 units.  Pt says he sometimes misses the insulin doses, especially if he is not at home then.  He says cbg varies from 100-200's.  It is in general higher as the day goes on.  He reports pain at injection sites, and request to change to qd dosing.   Past Medical History:  Diagnosis Date  . Benign colon polyp 08/01/2013   [facility] in [place]. large base tranverse colon polyp was biopsied. polyp was benign with minimal surface hyperplastic change.  . Chronic combined systolic and diastolic CHF (congestive heart failure) ([place])   . Chronic pain   . CKD (chronic kidney disease), stage III ([place])   . Diabetes mellitus without complication ([place])   . Diverticulosis   . Esophageal hiatal hernia 07/29/2013   confirmed on EGD   . Esophageal stricture   . Essential hypertension   . Gastritis 07/29/2013   confirmed on EGD, bx done an negative for intestinal metaplasia, dsyplasia or H. pylori. normal gastric emptying study done 07/13/2013.  [name] GERD (gastroesophageal reflux disease)   . Morbid obesity ([place])   . Non-obstructive CAD    a. 02/2013 Cath ([place]): nonobs dzs;  b. 09/2014 Myoview ([place]): EF 55%, no ischemia;  c. 12/2014 Echo: EF 50-55%, gr1 DD, no effusion.  . Persistent atrial fibrillation ([place])    a. 02/2013 s/p rfca in [place], [place]-->prev on Xarelto, d/c'd 2/2 anemia, ? GIB.  [name]  Rheumatoid arthritis ([place])   . Sigmoid diverticulosis 08/01/2013   confirmed on colonscopy. record scanned into chart    Past Surgical History:  Procedure Laterality Date  . CARDIAC CATHETERIZATION  05/2014   ablation for atrial fibrillation  . CARDIAC CATHETERIZATION N/A 11/16/2015   Procedure: Left Heart Cath and Coronary Angiography;  Surgeon: Leonie Man, MD;  Location: [facility];  Service: Cardiovascular;  Laterality: N/A;  . COLON RESECTION  09/2013   due to large, abnormal polpy. non cancerous per patient.   . COLON SURGERY  09/2014   colon resection     Social History   Socioeconomic History  . Marital status: Married    Spouse name: Amethyst  . Number of children: 8  . Years of education: 38  . Highest education level: Not on file  Occupational History    Comment: disabled  Social Needs  . Financial resource strain: Not on file  . Food insecurity:    Worry: Not on file    Inability: Not on file  . Transportation needs:    Medical: Not on file    Non-medical: Not on file  Tobacco Use  . Smoking status: Former Smoker    Packs/day: 0.50    Years: 10.00    Pack years: 5.00    Types: Cigarettes    Last attempt to quit: 04/11/2008  Years since quitting: 10.0  . Smokeless tobacco: Never Used  Substance and Sexual Activity  . Alcohol use: No    Alcohol/week: 0.0 standard drinks  . Drug use: No  . Sexual activity: Yes    Partners: Female    Birth control/protection: None  Lifestyle  . Physical activity:    Days per week: Not on file    Minutes per session: Not on file  . Stress: Not on file  Relationships  . Social connections:    Talks on phone: Not on file    Gets together: Not on file    Attends religious service: Not on file    Active member of club or organization: Not on file    Attends meetings of clubs or organizations: Not on file    Relationship status: Not on file  . Intimate partner violence:    Fear of current or ex partner: Not  on file    Emotionally abused: Not on file    Physically abused: Not on file    Forced sexual activity: Not on file  Other Topics Concern  . Not on file  Social History Narrative   Lives with wife. Does not work.  On disability.    Caffeine- coffee, 1/2 cup daily    Current Outpatient Medications on File Prior to Visit  Medication Sig Dispense Refill  . aspirin (EQ ASPIRIN ADULT LOW DOSE) 81 MG EC tablet Take 1 tablet (81 mg total) by mouth daily. Swallow whole. 90 tablet 3  . atorvastatin (LIPITOR) 40 MG tablet TAKE 1 TABLET BY MOUTH ONCE DAILY 90 tablet 0  . BD PEN NEEDLE NANO U/F 32G X 4 MM MISC USE ONE  THREE TIMES DAILY 100 each 11  . Blood Pressure KIT 1 each by Does not apply route 2 (two) times daily. 1 each 0  . cetirizine (ALL DAY ALLERGY) 10 MG tablet Take 1 tablet (10 mg total) by mouth daily. 30 tablet 3  . furosemide (LASIX) 20 MG tablet Take 1 tablet (20 mg total) by mouth daily. 90 tablet 3  . HYDROcodone-acetaminophen (NORCO) 7.5-325 MG tablet Take 1 tablet by mouth 3 (three) times daily.  0  . losartan (COZAAR) 100 MG tablet Take 1 tablet (100 mg total) by mouth daily. 30 tablet 2  . predniSONE (DELTASONE) 10 MG tablet Take 1 tablet (10 mg total) by mouth daily. 30 tablet 1  . pregabalin (LYRICA) 100 MG capsule Take 1 capsule (100 mg total) by mouth 3 (three) times daily. 90 capsule 5  . ranitidine (ZANTAC) 150 MG tablet Take 1 tablet (150 mg total) by mouth daily before breakfast. 90 tablet 1  . Secukinumab (COSENTYX) 150 MG/ML SOSY Inject 150 mg into the skin every 28 (twenty-eight) days.     [name] ULORIC 40 MG tablet Take 40 mg by mouth daily.  0  . zolpidem (AMBIEN CR) 6.25 MG CR tablet Take 1 tablet (6.25 mg total) by mouth at bedtime as needed for sleep. Each prescription to last 1 month. 30 tablet 1  . diclofenac sodium (VOLTAREN) 1 % GEL Apply 2 g topically 4 (four) times daily. Place over right knee for 5 days. (Patient not taking: Reported on 04/14/2018) 1 Tube 0    Current Facility-Administered Medications on File Prior to Visit  Medication Dose Route Frequency Provider Last Rate Last Dose  . 0.9 %  sodium chloride infusion   Intravenous Continuous [name], MD        No Known Allergies  Family History  Problem Relation Age of Onset  . Hypertension Mother   . Diabetes Mother   . COPD Mother   . Lung cancer Mother        Smoker   . Hypertension Father   . Diabetes Father   . Heart Problems Father   . COPD Father   . Colon cancer Neg Hx   . Stomach cancer Neg Hx     BP (!) 162/98 (BP Location: Left Arm, Patient Position: Sitting, Cuff Size: Normal)   Pulse 83   Wt 216 lb 12.8 oz (98.3 kg)   SpO2 95%   BMI 34.99 kg/m    Review of Systems denies weight loss, blurry vision, headache, sob, n/v, urinary frequency, memory loss, depression, cold intolerance, rhinorrhea, hypoglycemia, and easy bruising.  No change in chronic chest pain, excessive diaphoresis, or leg cramps.      Objective:   Physical Exam VS: see vs page GEN: no distress HEAD: head: no deformity eyes: no periorbital swelling, no proptosis external nose and ears are normal mouth: no lesion seen NECK: supple, thyroid is not enlarged CHEST WALL: no deformity.   LUNGS: clear to auscultation CV: reg rate and rhythm, no murmur ABD: abdomen is soft, nontender.  no hepatosplenomegaly.  not distended.  no hernia MUSCULOSKELETAL: muscle bulk and strength are grossly normal.  no obvious joint swelling.  gait is normal and steady EXTEMITIES: no deformity.  no ulcer on the feet.  feet are of normal color and temp.  Trace bilat leg edema.  There is bilateral onychomycosis of the toenails PULSES: dorsalis pedis intact bilat.  no carotid bruit NEURO:  cn 2-12 grossly intact.   readily moves all 4's.  sensation is intact to touch on the feet, but decreased from normal.  SKIN:  Normal texture and temperature.  No rash or suspicious lesion is visible.   NODES:  None palpable  at the neck PSYCH: alert, well-oriented.  Does not appear anxious nor depressed.   Lab Results  Component Value Date   HGBA1C 8.8 (A) 04/14/2018     Lab Results  Component Value Date   CREATININE 1.77 (H) 01/29/2018   BUN 18 01/29/2018   NA 138 01/29/2018   K 3.7 01/29/2018   CL 93 (L) 01/29/2018   CO2 25 01/29/2018   I have reviewed outside records, and summarized: Pt was noted to have elevated a1c, and referred here.  Multiple CV probs were noted, including AF, HTN, CAD, and CHF.      Assessment & Plan:  Insulin-requiring type 2 DM, with renal failure, new to me: he needs increased rx.  Noncompliance with insulin.  We discussed.  We'll change to QD insulin HTN: is noted today   Patient Instructions  Your blood pressure is high today.  Please see your primary care provider soon, to have it rechecked.   good diet and exercise significantly improve the control of your diabetes.  please let me know if you wish to be referred to a dietician.  high blood sugar is very risky to your health.  you should see an eye doctor and dentist every year.  It is very important to get all recommended vaccinations.   Controlling your blood pressure and cholesterol drastically reduces the damage diabetes does to your body.  Those who smoke should quit.  Please discuss these with your doctor.   check your blood sugar twice a day.  vary the time of day when you check, between before the 3  meals, and at bedtime.  also check if you have symptoms of your blood sugar being too high or too low.  please keep a record of the readings and bring it to your next appointment here (or you can bring the meter itself).  You can write it on any piece of paper.  please call us sooner if your blood sugar goes below 70, or if you have a lot of readings over 200.   For now, please: Stop taking the novolog, and: Change the levemir to 180 units each morning.  On this type of insulin schedule, you should eat meals on a  regular schedule.  If a meal is missed or significantly delayed, your blood sugar could go low. Please call or message [place] next week, to tell us how the blood sugar is doing.   Please come back for a follow-up appointment in 2 months.

## 2018-04-14 NOTE — Patient Instructions (Addendum)
Your blood pressure is high today.  Please see your primary care provider soon, to have it rechecked.   good diet and exercise significantly improve the control of your diabetes.  please let me know if you wish to be referred to a dietician.  high blood sugar is very risky to your health.  you should see an eye doctor and dentist every year.  It is very important to get all recommended vaccinations.   Controlling your blood pressure and cholesterol drastically reduces the damage diabetes does to your body.  Those who smoke should quit.  Please discuss these with your doctor.   check your blood sugar twice a day.  vary the time of day when you check, between before the 3 meals, and at bedtime.  also check if you have symptoms of your blood sugar being too high or too low.  please keep a record of the readings and bring it to your next appointment here (or you can bring the meter itself).  You can write it on any piece of paper.  please call us sooner if your blood sugar goes below 70, or if you have a lot of readings over 200.   For now, please: Stop taking the novolog, and: Change the levemir to 180 units each morning.  On this type of insulin schedule, you should eat meals on a regular schedule.  If a meal is missed or significantly delayed, your blood sugar could go low. Please call or message [place] next week, to tell us how the blood sugar is doing.   Please come back for a follow-up appointment in 2 months.

## 2018-04-15 ENCOUNTER — Ambulatory Visit (HOSPITAL_COMMUNITY): Payer: Medicaid Other

## 2018-04-15 ENCOUNTER — Other Ambulatory Visit: Payer: Medicaid Other

## 2018-04-16 ENCOUNTER — Other Ambulatory Visit: Payer: Self-pay | Admitting: Physician Assistant

## 2018-04-21 ENCOUNTER — Ambulatory Visit (HOSPITAL_COMMUNITY): Payer: Medicaid Other | Attending: Cardiovascular Disease

## 2018-04-21 ENCOUNTER — Other Ambulatory Visit: Payer: Medicaid Other | Admitting: *Deleted

## 2018-04-21 ENCOUNTER — Telehealth: Payer: Self-pay | Admitting: Cardiology

## 2018-04-21 ENCOUNTER — Other Ambulatory Visit: Payer: Self-pay

## 2018-04-21 DIAGNOSIS — I2510 Atherosclerotic heart disease of native coronary artery without angina pectoris: Secondary | ICD-10-CM

## 2018-04-21 DIAGNOSIS — R0602 Shortness of breath: Secondary | ICD-10-CM | POA: Insufficient documentation

## 2018-04-21 DIAGNOSIS — I10 Essential (primary) hypertension: Secondary | ICD-10-CM

## 2018-04-21 DIAGNOSIS — I110 Hypertensive heart disease with heart failure: Secondary | ICD-10-CM | POA: Diagnosis not present

## 2018-04-21 DIAGNOSIS — I509 Heart failure, unspecified: Secondary | ICD-10-CM | POA: Insufficient documentation

## 2018-04-21 DIAGNOSIS — E876 Hypokalemia: Secondary | ICD-10-CM

## 2018-04-21 DIAGNOSIS — I5042 Chronic combined systolic (congestive) and diastolic (congestive) heart failure: Secondary | ICD-10-CM

## 2018-04-21 DIAGNOSIS — I4891 Unspecified atrial fibrillation: Secondary | ICD-10-CM | POA: Insufficient documentation

## 2018-04-21 DIAGNOSIS — I4819 Other persistent atrial fibrillation: Secondary | ICD-10-CM

## 2018-04-21 DIAGNOSIS — E7800 Pure hypercholesterolemia, unspecified: Secondary | ICD-10-CM

## 2018-04-21 DIAGNOSIS — E785 Hyperlipidemia, unspecified: Secondary | ICD-10-CM | POA: Diagnosis not present

## 2018-04-21 LAB — HEPATIC FUNCTION PANEL
ALT: 23 IU/L (ref 0–44)
AST: 13 IU/L (ref 0–40)
Albumin: 4.1 g/dL (ref 3.6–4.8)
Alkaline Phosphatase: 65 IU/L (ref 39–117)
BILIRUBIN TOTAL: 0.3 mg/dL (ref 0.0–1.2)
Bilirubin, Direct: 0.15 mg/dL (ref 0.00–0.40)
Total Protein: 7 g/dL (ref 6.0–8.5)

## 2018-04-21 LAB — BASIC METABOLIC PANEL
BUN/Creatinine Ratio: 8 — ABNORMAL LOW (ref 10–24)
BUN: 13 mg/dL (ref 8–27)
CALCIUM: 9.4 mg/dL (ref 8.6–10.2)
CHLORIDE: 97 mmol/L (ref 96–106)
CO2: 27 mmol/L (ref 20–29)
Creatinine, Ser: 1.55 mg/dL — ABNORMAL HIGH (ref 0.76–1.27)
GFR calc non Af Amer: 47 mL/min/{1.73_m2} — ABNORMAL LOW (ref >59)
GFR, EST AFRICAN AMERICAN: 54 mL/min/{1.73_m2} — AB (ref >59)
Glucose: 83 mg/dL (ref 65–99)
POTASSIUM: 3 mmol/L — AB (ref 3.5–5.2)
Sodium: 143 mmol/L (ref 134–144)

## 2018-04-21 LAB — LIPID PANEL
CHOL/HDL RATIO: 5.8 ratio — AB (ref 0.0–5.0)
CHOLESTEROL TOTAL: 222 mg/dL — AB (ref 100–199)
HDL: 38 mg/dL — ABNORMAL LOW (ref >39)
LDL CALC: 148 mg/dL — AB (ref 0–99)
TRIGLYCERIDES: 180 mg/dL — AB (ref 0–149)
VLDL CHOLESTEROL CAL: 36 mg/dL (ref 5–40)

## 2018-04-21 MED ORDER — HYDRALAZINE HCL 25 MG PO TABS: 25.0000 mg | ORAL_TABLET | Freq: Three times a day (TID) | ORAL | 11 refills | Status: DC

## 2018-04-21 MED ORDER — POTASSIUM CHLORIDE CRYS ER 20 MEQ PO TBCR: 40.0000 meq | EXTENDED_RELEASE_TABLET | Freq: Every day | ORAL | 0 refills | Status: DC

## 2018-04-21 MED ORDER — ISOSORBIDE MONONITRATE ER 30 MG PO TB24: 30.0000 mg | ORAL_TABLET | Freq: Every day | ORAL | 3 refills | Status: DC

## 2018-04-21 NOTE — Telephone Encounter (Signed)
Spoke with patient about lab results, informed him about medicine changes. He accepted. Marco [name] spoke with Dr. Caryl Comes about his low potassium and he advised to take 40 meq for 2 days to get back to a good level. Advised patient that I will call back tomorrow to set up follow up appointment. A BMET was scheduled for 9/19.     Notes recorded by Sueanne Margarita, MD on 04/21/2018 at 2:47 PM EDT Echo showed moderately reduced LVF with EF 35-40% with severe HK of the inferior wall. Given decline in LVF and new wall motion abnormality he needs further assessment but creatinine is elevated. Cath in 2017 with no significant CAD.  Please stop Losartan and start Hydralazine 25mg  TID and Imdur 30mg  daily and have him see extender in 1 week for followup on BP and check BMET and set up for coronary CTA if creatinine has improved. Stop Lasix as well.

## 2018-04-21 NOTE — Telephone Encounter (Signed)
New Message ° ° °Patient is calling to obtain lab results. Please call to discuss.  °

## 2018-04-22 ENCOUNTER — Other Ambulatory Visit: Payer: Self-pay | Admitting: Internal Medicine

## 2018-04-22 DIAGNOSIS — I2510 Atherosclerotic heart disease of native coronary artery without angina pectoris: Secondary | ICD-10-CM

## 2018-04-22 NOTE — Telephone Encounter (Signed)
Spoke with patient and scheduled a follow up on his blood pressure medications with [name], PA on 9/20 at 11:15am. Advised to wait on BMET on 9/20 before cardiac CT can be scheduled.

## 2018-04-26 ENCOUNTER — Telehealth: Payer: Self-pay

## 2018-04-26 NOTE — Telephone Encounter (Signed)
Notes recorded by Frederik Schmidt, RN on 04/26/2018 at 8:48 AM EDT [email or web address] 9/16 ------

## 2018-04-26 NOTE — Telephone Encounter (Signed)
-----   Message from Dorothy Spark, MD sent at 04/24/2018  8:14 PM EDT ----- Improved Crea from 1.7 to 1.5, normal LFT, elevated lipids, can you ask if he is using his atorvastatin?

## 2018-04-27 ENCOUNTER — Telehealth: Payer: Self-pay

## 2018-04-27 ENCOUNTER — Other Ambulatory Visit: Payer: Self-pay | Admitting: Internal Medicine

## 2018-04-27 DIAGNOSIS — G4700 Insomnia, unspecified: Secondary | ICD-10-CM

## 2018-04-27 NOTE — Telephone Encounter (Signed)
Notes recorded by Frederik Schmidt, RN on 04/27/2018 at 8:45 AM EDT [email or web address] 9/17 ------

## 2018-04-27 NOTE — Telephone Encounter (Signed)
-----   Message from Dorothy Spark, MD sent at 04/24/2018  8:14 PM EDT ----- Improved Crea from 1.7 to 1.5, normal LFT, elevated lipids, can you ask if he is using his atorvastatin?

## 2018-04-27 NOTE — Telephone Encounter (Signed)
1) Medication(s) Requested (by name): zolpidem 2) Pharmacy of Choice: [facility] on [address]. 3) Special Requests:   Approved medications will be sent to the pharmacy, we will reach out if there is an issue.  Requests made after 3pm may not be addressed until the following business day!  If a patient is unsure of the name of the medication(s) please note and ask patient to call back when they are able to provide all info, do not send to responsible party until all information is available!

## 2018-04-28 ENCOUNTER — Ambulatory Visit: Payer: Medicaid Other | Attending: Internal Medicine | Admitting: Pharmacist

## 2018-04-28 ENCOUNTER — Encounter: Payer: Self-pay | Admitting: Pharmacist

## 2018-04-28 ENCOUNTER — Telehealth: Payer: Self-pay

## 2018-04-28 VITALS — BP 141/65 | HR 87

## 2018-04-28 DIAGNOSIS — I10 Essential (primary) hypertension: Secondary | ICD-10-CM | POA: Diagnosis present

## 2018-04-28 NOTE — Telephone Encounter (Signed)
Notes recorded by Frederik Schmidt, RN on 04/28/2018 at 12:58 PM EDT Reviewed labs with patient and checked on his atorvastatin. He said that he is taking the medication. Patient has an appt with Scott on 9/20 and labs will be drawn. ------

## 2018-04-28 NOTE — Progress Notes (Addendum)
   S: PCP: Dr. Wynetta Emery     HPI:  Patient arrives in good spirits. Presents to the clinic for BP check. Apparently, the patient went into the dentist's office this morning and BP was elevated (reports SBP 180). Pt states that he believes this to be d/t pain and recent BP medication changes. BP was obtained by a wrist cuff. Dentist instructed patient to go either to his doctor's office or to the hospital. Pt called the clinic here and he was placed on my schedule. He has an appointment with his PCP tomorrow.   Summary of recent medical encounters:  1. PCP visit 02/22/18: BP 117/84 - no changes made to medications 2. Cardiology visit 04/07/18: BP 160/88 - Lasix dose decreased per patient preference and losartan continued 3. Endocrinology visit 04/14/18: BP 162/98 - no changes made to medications 4. Telephone encounter 04/21/18: Scr 1.55 (down from 1.77), K 3.0 - [name] stopped Losartan and furosemide and started hydralazine 25 mg TID and Isosorbide 30 mg daily  Today, he denies chest pain, blurred vision. Reports HA that comes and goes. Has been going on for ~3 days. Pt was short of breath this morning at his dentist appointment. That has improved since. He is worried that his cardiologist took him off of furosemide. He believes his SOB/congestion could be related to this.   Patient reports adherence with medications.  Current BP Medications include:   - hydralazine 25 mg TID - Isosorbide 30 mg daily  O:  1st reading 156/78, 81 2nd reading after 5 minutes rest: 141/65, HR 87  Last 3 Office BP readings: BP Readings from Last 3 Encounters:  04/14/18 (!) 162/98  04/07/18 (!) 160/88  02/22/18 117/84   BMET    Component Value Date/Time   NA 143 04/21/2018 0726   K 3.0 (L) 04/21/2018 0726   CL 97 04/21/2018 0726   CO2 27 04/21/2018 0726   GLUCOSE 83 04/21/2018 0726   GLUCOSE 324 (H) 08/31/2017 1554   BUN 13 04/21/2018 0726   CREATININE 1.55 (H) 04/21/2018 0726   CREATININE 1.42 (H)  09/09/2016 1037   CALCIUM 9.4 04/21/2018 0726   GFRNONAA 47 (L) 04/21/2018 0726   GFRNONAA 53 (L) 09/09/2016 1037   GFRAA 54 (L) 04/21/2018 0726   GFRAA 61 09/09/2016 1037    Renal function: CrCl cannot be calculated (Unknown ideal weight.).  A/P: Hypertension longstanding currently uncontrolled on current medications. BP Goal <130/80 mmHg. Patient is adherent with current medications.   As this was only a BP check, will forward information to Dr. Wynetta Emery and have patient keep his appointment tomorrow. Pt encouraged to seek emergency care if HA worsens or SOB/chest pain returns.  -Continued anti-hypertensives.  -Counseled on lifestyle modifications for blood pressure control including reduced dietary sodium, increased exercise, adequate sleep  Results reviewed and written information provided.   Total time in face-to-face counseling 15 minutes.   F/U Clinic Visit 04/29/18.  Benard Halsted, PharmD, [place] [phone]

## 2018-04-28 NOTE — Telephone Encounter (Signed)
-----   Message from Dorothy Spark, MD sent at 04/24/2018  8:14 PM EDT ----- Improved Crea from 1.7 to 1.5, normal LFT, elevated lipids, can you ask if he is using his atorvastatin?

## 2018-04-28 NOTE — Patient Instructions (Signed)
Thank you for coming to see [place] today.   Blood pressure today is elevated but better than this morning.  Continue taking blood pressure medications as prescribed and follow-up with Dr. Wynetta Emery tomorrow.  If you have any questions about medications, please call me [phone].  Lurena Joiner

## 2018-04-29 ENCOUNTER — Encounter: Payer: Self-pay | Admitting: Internal Medicine

## 2018-04-29 ENCOUNTER — Ambulatory Visit (HOSPITAL_BASED_OUTPATIENT_CLINIC_OR_DEPARTMENT_OTHER): Payer: Medicaid Other | Admitting: Internal Medicine

## 2018-04-29 ENCOUNTER — Other Ambulatory Visit: Payer: Self-pay

## 2018-04-29 ENCOUNTER — Other Ambulatory Visit: Payer: Medicaid Other

## 2018-04-29 ENCOUNTER — Ambulatory Visit (HOSPITAL_COMMUNITY)
Admission: RE | Admit: 2018-04-29 | Discharge: 2018-04-29 | Disposition: A | Discharge status: Home / Self Care | Payer: Medicaid Other | Source: Ambulatory Visit | Attending: Internal Medicine | Admitting: Internal Medicine

## 2018-04-29 ENCOUNTER — Telehealth: Payer: Self-pay | Admitting: Internal Medicine

## 2018-04-29 ENCOUNTER — Telehealth: Payer: Self-pay | Admitting: Cardiology

## 2018-04-29 VITALS — BP 158/84 | HR 99 | Temp 99.0°F | Resp 16 | Wt 225.4 lb

## 2018-04-29 DIAGNOSIS — Z79899 Other long term (current) drug therapy: Secondary | ICD-10-CM | POA: Insufficient documentation

## 2018-04-29 DIAGNOSIS — Z7982 Long term (current) use of aspirin: Secondary | ICD-10-CM | POA: Diagnosis not present

## 2018-04-29 DIAGNOSIS — E1142 Type 2 diabetes mellitus with diabetic polyneuropathy: Secondary | ICD-10-CM | POA: Insufficient documentation

## 2018-04-29 DIAGNOSIS — Z8249 Family history of ischemic heart disease and other diseases of the circulatory system: Secondary | ICD-10-CM | POA: Insufficient documentation

## 2018-04-29 DIAGNOSIS — E11319 Type 2 diabetes mellitus with unspecified diabetic retinopathy without macular edema: Secondary | ICD-10-CM | POA: Insufficient documentation

## 2018-04-29 DIAGNOSIS — J209 Acute bronchitis, unspecified: Secondary | ICD-10-CM | POA: Insufficient documentation

## 2018-04-29 DIAGNOSIS — I2510 Atherosclerotic heart disease of native coronary artery without angina pectoris: Secondary | ICD-10-CM | POA: Insufficient documentation

## 2018-04-29 DIAGNOSIS — G4700 Insomnia, unspecified: Secondary | ICD-10-CM | POA: Insufficient documentation

## 2018-04-29 DIAGNOSIS — R0602 Shortness of breath: Secondary | ICD-10-CM | POA: Insufficient documentation

## 2018-04-29 DIAGNOSIS — E876 Hypokalemia: Secondary | ICD-10-CM

## 2018-04-29 DIAGNOSIS — I10 Essential (primary) hypertension: Secondary | ICD-10-CM | POA: Diagnosis not present

## 2018-04-29 DIAGNOSIS — Z833 Family history of diabetes mellitus: Secondary | ICD-10-CM | POA: Diagnosis not present

## 2018-04-29 DIAGNOSIS — G4733 Obstructive sleep apnea (adult) (pediatric): Secondary | ICD-10-CM | POA: Diagnosis not present

## 2018-04-29 DIAGNOSIS — Z7952 Long term (current) use of systemic steroids: Secondary | ICD-10-CM | POA: Insufficient documentation

## 2018-04-29 DIAGNOSIS — I5042 Chronic combined systolic (congestive) and diastolic (congestive) heart failure: Secondary | ICD-10-CM | POA: Diagnosis not present

## 2018-04-29 DIAGNOSIS — Z801 Family history of malignant neoplasm of trachea, bronchus and lung: Secondary | ICD-10-CM | POA: Diagnosis not present

## 2018-04-29 DIAGNOSIS — Z825 Family history of asthma and other chronic lower respiratory diseases: Secondary | ICD-10-CM | POA: Diagnosis not present

## 2018-04-29 DIAGNOSIS — Z87891 Personal history of nicotine dependence: Secondary | ICD-10-CM | POA: Insufficient documentation

## 2018-04-29 DIAGNOSIS — N183 Chronic kidney disease, stage 3 (moderate): Secondary | ICD-10-CM | POA: Insufficient documentation

## 2018-04-29 DIAGNOSIS — I130 Hypertensive heart and chronic kidney disease with heart failure and stage 1 through stage 4 chronic kidney disease, or unspecified chronic kidney disease: Secondary | ICD-10-CM | POA: Diagnosis not present

## 2018-04-29 DIAGNOSIS — F329 Major depressive disorder, single episode, unspecified: Secondary | ICD-10-CM | POA: Insufficient documentation

## 2018-04-29 DIAGNOSIS — I4891 Unspecified atrial fibrillation: Secondary | ICD-10-CM | POA: Diagnosis not present

## 2018-04-29 DIAGNOSIS — Z794 Long term (current) use of insulin: Secondary | ICD-10-CM | POA: Diagnosis not present

## 2018-04-29 DIAGNOSIS — E1122 Type 2 diabetes mellitus with diabetic chronic kidney disease: Secondary | ICD-10-CM | POA: Insufficient documentation

## 2018-04-29 DIAGNOSIS — L409 Psoriasis, unspecified: Secondary | ICD-10-CM | POA: Diagnosis not present

## 2018-04-29 DIAGNOSIS — J329 Chronic sinusitis, unspecified: Secondary | ICD-10-CM | POA: Diagnosis present

## 2018-04-29 DIAGNOSIS — E785 Hyperlipidemia, unspecified: Secondary | ICD-10-CM | POA: Insufficient documentation

## 2018-04-29 DIAGNOSIS — G8929 Other chronic pain: Secondary | ICD-10-CM | POA: Insufficient documentation

## 2018-04-29 MED ORDER — HYDRALAZINE HCL 50 MG PO TABS: 50.0000 mg | ORAL_TABLET | Freq: Three times a day (TID) | ORAL | 11 refills | Status: DC

## 2018-04-29 MED ORDER — ZOLPIDEM TARTRATE ER 6.25 MG PO TBCR: 6.2500 mg | EXTENDED_RELEASE_TABLET | Freq: Every evening | ORAL | 3 refills | Status: DC | PRN

## 2018-04-29 MED ORDER — MOXIFLOXACIN HCL 400 MG PO TABS: 400.0000 mg | ORAL_TABLET | Freq: Every day | ORAL | 0 refills | Status: DC

## 2018-04-29 MED ORDER — ALBUTEROL SULFATE HFA 108 (90 BASE) MCG/ACT IN AERS: 2.0000 | INHALATION_SPRAY | Freq: Four times a day (QID) | RESPIRATORY_TRACT | 0 refills | Status: DC | PRN

## 2018-04-29 NOTE — Telephone Encounter (Signed)
Pt called to go over his new medications before he begins taking them. Please follow up as soon as possible. PHONE:[phone]

## 2018-04-29 NOTE — Telephone Encounter (Signed)
Patient coming in on Friday 9/20 for OV.

## 2018-04-29 NOTE — Telephone Encounter (Signed)
New Message   Pt c/o medication issue:  1. Name of Medication: 3 medications prescribed bor sinus infection from eye doc  2. How are you currently taking this medication (dosage and times per day)? n/a  3. Are you having a reaction (difficulty breathing--STAT)? no  4. What is your medication issue?  Pt states he wants to check with [name] about some medications his eye doc placed him on to make sure it will not interfere with anything. Please call

## 2018-04-29 NOTE — Telephone Encounter (Signed)
Patient requesting refill on Ambien.  [place] controlled substance reporting system reviewed and is appropriate.  Refill given.

## 2018-04-29 NOTE — Telephone Encounter (Signed)
Pt is in clinic. Provided counseling. All questions addressed.

## 2018-04-29 NOTE — Patient Instructions (Signed)
Increase hydralazine to 50 mg 3 times a day. Use the albuterol inhaler as needed. Take the Avelox antibiotic once a day for 7 days. Hold off on taking your prednisone 10 mg for 3 days The Methylprednisone pack that you have: Please take 5 tablets daily for 3 days then go back to your Prednisone 10 mg daily.  You may have to use your short acting insulin during the time that you are taking the higher dose of steroid.

## 2018-04-29 NOTE — Telephone Encounter (Signed)
Pt has an appointment scheduled today to see pcp

## 2018-04-29 NOTE — Progress Notes (Signed)
Pt states he went to the eye doctor yesterday and the doctor told him he has a sinuitis  Pt states the eye doctor prescribed him antibiotics and a steroid but pt hasn't started yet because he wanted to follow up with [facility] or pcp

## 2018-04-29 NOTE — Progress Notes (Signed)
Patient ID: Marco [name], male    DOB: September 10, 1955  MRN: [id number]  CC: Headache; Sinus Problem; Facial Swelling; and Shortness of Breath   Subjective: Marco [name] is a 62 y.o. male who presents for UC His concerns today include:   Saw ophthalmologist yesterday, told he has sinus infection. Given Keflex 500 mg BID, and Methylpred 4 mg taper pack and nonantibiotic ointment for his eyes.  He wanted me to see the medications first to make sure they were okay to take with his other medicines. He reports not feeling well for the past 3 to 4 days.  Symptoms include nasal congestion, pain across forehead, maxillary sinuses and gums.  Cough productive of green-blood tinge sputum. +SOB with wheezing with minimal exertion.  No fever but hot and cold spells   BP machine is broken BP elevated yesterday at dentist.  Saw our clinical pharmacist yesterday for same Pt states he did not take meds yesterday before dental appt.  Losartan and Furosemide d/c by cardiologist.  Currently on isosorbide and hydralazine. Patient Active Problem List   Diagnosis Date Noted  . Renal failure (ARF), acute on chronic (HCC) 08/27/2017  . Dysphagia 01/09/2017  . Hyperlipidemia 12/23/2016  . Chronic combined systolic and diastolic CHF (congestive heart failure) ([place]) 09/09/2016  . Diabetes mellitus with complication ([place])   . Diabetic retinopathy ([place]) 07/22/2016  . Diabetic polyneuropathy associated with type 2 diabetes mellitus ([place]) 06/10/2016  . Myalgia and myositis 03/31/2016  . Dyspnea on exertion 02/09/2016  . Chronic gouty arthritis 11/29/2015  . Ache in joint 11/27/2015  . LBP (low back pain) 11/27/2015  . Arthralgia of multiple joints 11/27/2015  . Psoriasis 11/27/2015  . Breast pain in male 11/14/2015  . Sinusitis, chronic 10/16/2015  . Insomnia 10/16/2015  . Adult BMI 30+ 10/04/2015  . New onset of headaches after age 68 09/20/2015  . OSA (obstructive sleep apnea) 07/12/2015  . Low serum  testosterone level 05/18/2015  . Depression 05/16/2015  . Chronic pain   . CAD (coronary artery disease) 12/24/2014  . Atrial fibrillation ([place]) 12/24/2014  . Essential hypertension 12/24/2014  . CKD (chronic kidney disease) stage 3, GFR 30-59 ml/min (HCC) 12/24/2014  . PUD (peptic ulcer disease) 12/24/2014     Current Outpatient Medications on File Prior to Visit  Medication Sig Dispense Refill  . ACCU-CHEK FASTCLIX LANCETS MISC USE AS DIRECTED TO  CHECK  BLOOD  SUGAR  3  TIMES  DAILY 102 each 11  . aspirin (EQ ASPIRIN ADULT LOW DOSE) 81 MG EC tablet Take 1 tablet (81 mg total) by mouth daily. Swallow whole. 90 tablet 3  . atorvastatin (LIPITOR) 40 MG tablet TAKE 1 TABLET BY MOUTH ONCE DAILY 90 tablet 0  . BD PEN NEEDLE NANO U/F 32G X 4 MM MISC USE ONE  THREE TIMES DAILY 100 each 11  . Blood Pressure KIT 1 each by Does not apply route 2 (two) times daily. 1 each 0  . cetirizine (ALL DAY ALLERGY) 10 MG tablet Take 1 tablet (10 mg total) by mouth daily. 30 tablet 3  . diclofenac sodium (VOLTAREN) 1 % GEL Apply 2 g topically 4 (four) times daily. Place over right knee for 5 days. (Patient not taking: Reported on 04/14/2018) 1 Tube 0  . glucose blood (ACCU-CHEK AVIVA PLUS) test strip 1 each by Other route 2 (two) times daily. And lancets 2/day 200 each 3  . HYDROcodone-acetaminophen (NORCO) 7.5-325 MG tablet Take 1 tablet by mouth 3 (three) times daily.  0  . Insulin Detemir (LEVEMIR FLEXTOUCH) 100 UNIT/ML Pen Inject 180 Units into the skin every morning. 20 pen 2  . isosorbide mononitrate (IMDUR) 30 MG 24 hr tablet Take 1 tablet (30 mg total) by mouth daily. 90 tablet 3  . predniSONE (DELTASONE) 10 MG tablet Take 1 tablet (10 mg total) by mouth daily. 30 tablet 1  . pregabalin (LYRICA) 100 MG capsule Take 1 capsule (100 mg total) by mouth 3 (three) times daily. 90 capsule 5  . ranitidine (ZANTAC) 150 MG tablet Take 1 tablet (150 mg total) by mouth daily before breakfast. 90 tablet 1  .  Secukinumab (COSENTYX) 150 MG/ML SOSY Inject 150 mg into the skin every 28 (twenty-eight) days.     [name] ULORIC 40 MG tablet Take 40 mg by mouth daily.  0  . zolpidem (AMBIEN CR) 6.25 MG CR tablet Take 1 tablet (6.25 mg total) by mouth at bedtime as needed for sleep. Each prescription to last 1 month. 30 tablet 3   Current Facility-Administered Medications on File Prior to Visit  Medication Dose Route Frequency Provider Last Rate Last Dose  . 0.9 %  sodium chloride infusion   Intravenous Continuous [name], MD        No Known Allergies  Social History   Socioeconomic History  . Marital status: Married    Spouse name: Amethyst  . Number of children: 8  . Years of education: 71  . Highest education level: Not on file  Occupational History    Comment: disabled  Social Needs  . Financial resource strain: Not on file  . Food insecurity:    Worry: Not on file    Inability: Not on file  . Transportation needs:    Medical: Not on file    Non-medical: Not on file  Tobacco Use  . Smoking status: Former Smoker    Packs/day: 0.50    Years: 10.00    Pack years: 5.00    Types: Cigarettes    Last attempt to quit: 04/11/2008    Years since quitting: 10.0  . Smokeless tobacco: Never Used  Substance and Sexual Activity  . Alcohol use: No    Alcohol/week: 0.0 standard drinks  . Drug use: No  . Sexual activity: Yes    Partners: Female    Birth control/protection: None  Lifestyle  . Physical activity:    Days per week: Not on file    Minutes per session: Not on file  . Stress: Not on file  Relationships  . Social connections:    Talks on phone: Not on file    Gets together: Not on file    Attends religious service: Not on file    Active member of club or organization: Not on file    Attends meetings of clubs or organizations: Not on file    Relationship status: Not on file  . Intimate partner violence:    Fear of current or ex partner: Not on file    Emotionally abused:  Not on file    Physically abused: Not on file    Forced sexual activity: Not on file  Other Topics Concern  . Not on file  Social History Narrative   Lives with wife. Does not work.  On disability.    Caffeine- coffee, 1/2 cup daily    Family History  Problem Relation Age of Onset  . Hypertension Mother   . Diabetes Mother   . COPD Mother   . Lung cancer Mother  Smoker   . Hypertension Father   . Diabetes Father   . Heart Problems Father   . COPD Father   . Colon cancer Neg Hx   . Stomach cancer Neg Hx     Past Surgical History:  Procedure Laterality Date  . CARDIAC CATHETERIZATION  05/2014   ablation for atrial fibrillation  . CARDIAC CATHETERIZATION N/A 11/16/2015   Procedure: Left Heart Cath and Coronary Angiography;  Surgeon: Leonie Man, MD;  Location: [facility];  Service: Cardiovascular;  Laterality: N/A;  . COLON RESECTION  09/2013   due to large, abnormal polpy. non cancerous per patient.   . COLON SURGERY  09/2014   colon resection     ROS: Review of Systems Negative except as stated above  PHYSICAL EXAM: BP (!) 158/84   Pulse 99   Temp 99 F (37.2 C) (Oral)   Resp 16   Wt 225 lb 6.4 oz (102.2 kg)   SpO2 92%   BMI 36.38 kg/m   Physical Exam General appearance -patient in NAD but appears unwell. Mental status - normal mood, behavior, speech, dress, motor activity, and thought processes Nose - normal and patent, no erythema, discharge or polyps Mouth - mucous membranes moist, pharynx normal without lesions Neck - supple, no significant adenopathy Chest -audible wheezing when he coughs.  Some rhonchi's at the bases. Heart -regular with occasional premature beat Results for orders placed or performed in visit on 04/21/18  Lipid Profile  Result Value Ref Range   Cholesterol, Total 222 (H) 100 - 199 mg/dL   Triglycerides 180 (H) 0 - 149 mg/dL   HDL 38 (L) >39 mg/dL   VLDL Cholesterol Cal 36 5 - 40 mg/dL   LDL Calculated 148 (H) 0 -  99 mg/dL   Chol/HDL Ratio 5.8 (H) 0.0 - 5.0 ratio  Hepatic function panel  Result Value Ref Range   Total Protein 7.0 6.0 - 8.5 g/dL   Albumin 4.1 3.6 - 4.8 g/dL   Bilirubin Total 0.3 0.0 - 1.2 mg/dL   Bilirubin, Direct 0.15 0.00 - 0.40 mg/dL   Alkaline Phosphatase 65 39 - 117 IU/L   AST 13 0 - 40 IU/L   ALT 23 0 - 44 IU/L  Basic metabolic panel  Result Value Ref Range   Glucose 83 65 - 99 mg/dL   BUN 13 8 - 27 mg/dL   Creatinine, Ser 1.55 (H) 0.76 - 1.27 mg/dL   GFR calc non Af Amer 47 (L) >59 mL/min/1.73   GFR calc Af Amer 54 (L) >59 mL/min/1.73   BUN/Creatinine Ratio 8 (L) 10 - 24   Sodium 143 134 - 144 mmol/L   Potassium 3.0 (L) 3.5 - 5.2 mmol/L   Chloride 97 96 - 106 mmol/L   CO2 27 20 - 29 mmol/L   Calcium 9.4 8.6 - 10.2 mg/dL    ASSESSMENT AND PLAN: 1. Acute bronchitis, unspecified organism 2. Shortness of breath -Patient sent to hospital to have chest x-ray done today.  We will have him hold off on taking Keflex.  Patient placed on Avelox instead which will cover for sinusitis and probable pneumonia.  Prescription sent to his pharmacy for albuterol inhaler.  Patient normally on prednisone 10 mg through his rheumatologist.  He will hold that for now.  He will take the Medrol pack 4 mg 5 tablets daily for 3 days then he will go back to prednisone 10 mg daily.  Patient advised that blood sugars may spike on  the higher dose of steroid.  He still has some of his NovoLog at home which he was on previous.  Patient advised to use if blood sugars are running high while on the increased dose of steroid. - CBC with Differential/Platelet - Brain natriuretic peptide - DG Chest 2 View  3. Essential hypertension Not at goal.  Increase hydralazine to 50 mg 3 times a day.  4. Hypokalemia - Basic metabolic panel Patient was given the opportunity to ask questions.  Patient verbalized understanding of the plan and was able to repeat key elements of the plan.   Orders Placed This  Encounter  Procedures  . DG Chest 2 View  . CBC with Differential/Platelet  . Brain natriuretic peptide     Requested Prescriptions   Signed Prescriptions Disp Refills  . hydrALAZINE (APRESOLINE) 50 MG tablet 90 tablet 11    Sig: Take 1 tablet (50 mg total) by mouth 3 (three) times daily.  [name] albuterol (PROVENTIL HFA;VENTOLIN HFA) 108 (90 Base) MCG/ACT inhaler 1 Inhaler 0    Sig: Inhale 2 puffs into the lungs every 6 (six) hours as needed for wheezing or shortness of breath.  . moxifloxacin (AVELOX) 400 MG tablet 7 tablet 0    Sig: Take 1 tablet (400 mg total) by mouth daily.    No follow-ups on file.  Karle Plumber, MD, FACP

## 2018-04-30 ENCOUNTER — Encounter: Payer: Self-pay | Admitting: Physician Assistant

## 2018-04-30 ENCOUNTER — Encounter

## 2018-04-30 ENCOUNTER — Ambulatory Visit (INDEPENDENT_AMBULATORY_CARE_PROVIDER_SITE_OTHER): Payer: Medicaid Other | Admitting: Physician Assistant

## 2018-04-30 ENCOUNTER — Other Ambulatory Visit: Payer: Medicaid Other

## 2018-04-30 ENCOUNTER — Other Ambulatory Visit: Payer: Self-pay | Admitting: Physician Assistant

## 2018-04-30 VITALS — BP 142/90 | HR 103 | Ht 66.0 in | Wt 220.1 lb

## 2018-04-30 DIAGNOSIS — I4819 Other persistent atrial fibrillation: Secondary | ICD-10-CM

## 2018-04-30 DIAGNOSIS — I10 Essential (primary) hypertension: Secondary | ICD-10-CM

## 2018-04-30 DIAGNOSIS — I5042 Chronic combined systolic (congestive) and diastolic (congestive) heart failure: Secondary | ICD-10-CM

## 2018-04-30 DIAGNOSIS — N183 Chronic kidney disease, stage 3 unspecified: Secondary | ICD-10-CM

## 2018-04-30 DIAGNOSIS — I420 Dilated cardiomyopathy: Secondary | ICD-10-CM | POA: Diagnosis not present

## 2018-04-30 DIAGNOSIS — I481 Persistent atrial fibrillation: Secondary | ICD-10-CM | POA: Diagnosis not present

## 2018-04-30 LAB — BASIC METABOLIC PANEL
BUN/Creatinine Ratio: 13 (ref 10–24)
BUN: 18 mg/dL (ref 8–27)
CO2: 24 mmol/L (ref 20–29)
CREATININE: 1.43 mg/dL — AB (ref 0.76–1.27)
Calcium: 8.8 mg/dL (ref 8.6–10.2)
Chloride: 103 mmol/L (ref 96–106)
GFR calc Af Amer: 60 mL/min/{1.73_m2} (ref >59)
GFR, EST NON AFRICAN AMERICAN: 52 mL/min/{1.73_m2} — AB (ref >59)
Glucose: 170 mg/dL — ABNORMAL HIGH (ref 65–99)
POTASSIUM: 3.4 mmol/L — AB (ref 3.5–5.2)
Sodium: 144 mmol/L (ref 134–144)

## 2018-04-30 LAB — CBC WITH DIFFERENTIAL/PLATELET
BASOS: 0 %
Basophils Absolute: 0.1 10*3/uL (ref 0.0–0.2)
EOS (ABSOLUTE): 0 10*3/uL (ref 0.0–0.4)
EOS: 0 %
HEMATOCRIT: 42.7 % (ref 37.5–51.0)
Hemoglobin: 14.4 g/dL (ref 13.0–17.7)
Immature Grans (Abs): 0.1 10*3/uL (ref 0.0–0.1)
Immature Granulocytes: 1 %
LYMPHS ABS: 1.4 10*3/uL (ref 0.7–3.1)
Lymphs: 9 %
MCH: 29.9 pg (ref 26.6–33.0)
MCHC: 33.7 g/dL (ref 31.5–35.7)
MCV: 89 fL (ref 79–97)
MONOS ABS: 1.2 10*3/uL — AB (ref 0.1–0.9)
Monocytes: 7 %
Neutrophils Absolute: 13.9 10*3/uL — ABNORMAL HIGH (ref 1.4–7.0)
Neutrophils: 83 %
PLATELETS: 294 10*3/uL (ref 150–450)
RBC: 4.82 x10E6/uL (ref 4.14–5.80)
RDW: 13.2 % (ref 12.3–15.4)
WBC: 16.7 10*3/uL — ABNORMAL HIGH (ref 3.4–10.8)

## 2018-04-30 LAB — BRAIN NATRIURETIC PEPTIDE: BNP: 419.7 pg/mL — ABNORMAL HIGH (ref 0.0–100.0)

## 2018-04-30 MED ORDER — FUROSEMIDE 20 MG PO TABS: ORAL_TABLET | ORAL | 0 refills | Status: DC

## 2018-04-30 MED ORDER — BISOPROLOL FUMARATE 5 MG PO TABS: 2.5000 mg | ORAL_TABLET | Freq: Every day | ORAL | 11 refills | Status: DC

## 2018-04-30 MED ORDER — METOPROLOL TARTRATE 50 MG PO TABS: 50.0000 mg | ORAL_TABLET | Freq: Once | ORAL | 0 refills | Status: DC

## 2018-04-30 MED ORDER — POTASSIUM CHLORIDE ER 10 MEQ PO TBCR: EXTENDED_RELEASE_TABLET | ORAL | 0 refills | Status: DC

## 2018-04-30 NOTE — Progress Notes (Signed)
Cardiology Office Note:    Date:  04/30/2018   ID:  Marco [name], DOB [birth year], MRN [id number]  PCP:  [name], MD  Cardiologist:  [name], MD   Electrophysiologist:  None   Referring MD: [name], MD   Chief Complaint  Patient presents with  . Follow-up    CHF     History of Present Illness:    Marco [name] is a 62 y.o. male with combined systolic and diastolic heart failure, nonischemic cardiomyopathy, mild nonobstructive coronary artery disease by cardiac catheterization in 2017, persistent atrial fibrillation with prior history of ablation in [place] in 2017, diabetes, hypertension, chronic kidney disease, sleep apnea, obesity.  His EF was 45-50 but improved to normal by Echo in 01/2017. CHADS2-VASc=4 (CHF, DM, CAD, HTN).  Anticoagulation has been discontinued in the past secondary to anemia.  He has a history of chronic dyspnea and chronic chest discomfort.  Evaluation by pulmonology included a cardiopulmonary stress test in 2017.  However, this was not conclusive due to submaximal effort.    He was last seen by [name] 04/07/2018.  In light of his shortness of breath, a follow-up echocardiogram was obtained.  Follow-up with pulmonology was also recommended.  His echocardiogram did demonstrate worsening LV function with an EF of 35-40% and inferior hypokinesis.  [name] stopped his ARB and placed him on hydralazine/nitrates.  He is brought back today for follow-up.  The plan is to proceed with coronary CTA with FFR once his creatinine is improved.  Of note, he was seen by primary care yesterday for bronchitis.  Chest x-ray did not demonstrate edema.  WBC was elevated.  BNP is currently    Mr. [name] returns for follow up.  He notes increasing leg swelling since stopping the Lasix.  He had to resume this yesterday.  He notes purulent sputum and hemoptysis since he started having symptoms of bronchitis.  He has not yet started on the antibiotics.  He notes  worsening shortness of breath since he became ill.  He denies orthopnea, paroxysmal nocturnal dyspnea.  He has chronic chest pain without significant change.  He denies weight changes.  He denies syncope.    Prior CV studies:   The following studies were reviewed today:  Echocardiogram 04/21/2018 Severe LVH, EF 35-40, severe inferior hypokinesis, grade 1 diastolic dysfunction  Exercise tolerance test 02/12/2017  Pt walked on the treadmill for 20 seconds.  The treadmill was stopped due to severe shortness of breath .  There were no ST or T wave changes to suggest ischemia  Non diagnostic GXT due to inability to achieve target HR .  Echo 01/20/2017 Severe concentric LVH, EF 50-55, apical inferoseptal hypokinesis, grade 1 diastolic dysfunction, mild MR  Event monitor 01/01/2017  Normal sinus with average heart rate 67bpm  Occsional PVCs  Syncopal episode with no EKG changes during episode  Nuclear stress test 01/01/2017 Low risk stress nuclear study with small prior distal septal/apical infarct; no ischemia; EF 44 with global hypokinesis and mild LVE.  Echo 09/03/2016. Mild LVH, EF 45-50, diffuse HK, grade 2 diastolic dysfunction, mild RAE  Cardiopulmonary stress test 06/19/2016 Conclusion: The interpretation of this test is limited due to submaximal effort during the exercise. Test is extremely difficult to interpret given amount of information collected in such short time in the setting of submaximal exercise. However, it can be noted that VE/VCO2 (a submaximal parameter) and resting spirometry were normal. Patient was very intolerant to discomfort from several processes  during the CPX procedure today (i.e. Motion on bike, electrode removal, etc).   Cardiac catheterization 11/16/2015 LAD proximal 20; D2 ostial 35 LCx ostial 10 RCA irregularities EF 55-65 1. Angiographically minimal CAD. Mild calcification noted extraluminal. 2. The left ventricular systolic function is normal. Normal  LVEDP  Nuclear stress test 10/11/2015 EF 46, Low risk stress nuclear study with a small area of basal inferolateral very mild ischemia. Mildly reduced left ventricular global systolic function.  Past Medical History:  Diagnosis Date  . Benign colon polyp 08/01/2013   [facility] in [place]. large base tranverse colon polyp was biopsied. polyp was benign with minimal surface hyperplastic change.  . Chronic combined systolic and diastolic CHF (congestive heart failure) ([place])   . Chronic pain   . CKD (chronic kidney disease), stage III ([place])   . Diabetes mellitus without complication ([place])   . Diverticulosis   . Esophageal hiatal hernia 07/29/2013   confirmed on EGD   . Esophageal stricture   . Essential hypertension   . Gastritis 07/29/2013   confirmed on EGD, bx done an negative for intestinal metaplasia, dsyplasia or H. pylori. normal gastric emptying study done 07/13/2013.  [name] GERD (gastroesophageal reflux disease)   . Morbid obesity ([place])   . Non-obstructive CAD    a. 02/2013 Cath ([place]): nonobs dzs;  b. 09/2014 Myoview ([place]): EF 55%, no ischemia;  c. 12/2014 Echo: EF 50-55%, gr1 DD, no effusion.  . Persistent atrial fibrillation ([place])    a. 02/2013 s/p rfca in [place], [place]-->prev on Xarelto, d/c'd 2/2 anemia, ? GIB.  [name] Rheumatoid arthritis ([place])   . Sigmoid diverticulosis 08/01/2013   confirmed on colonscopy. record scanned into chart   Surgical Hx: The patient  has a past surgical history that includes Colon surgery (09/2014); Cardiac catheterization (05/2014); Colon resection (09/2013); and Cardiac catheterization (N/A, 11/16/2015).   Current Medications: Current Meds  Medication Sig  . ACCU-CHEK FASTCLIX LANCETS MISC USE AS DIRECTED TO  CHECK  BLOOD  SUGAR  3  TIMES  DAILY  . albuterol (PROVENTIL HFA;VENTOLIN HFA) 108 (90 Base) MCG/ACT inhaler Inhale 2 puffs into the lungs every 6 (six) hours as needed for wheezing or shortness of breath.  . allopurinol (ZYLOPRIM) 100  MG tablet Take 200 mg by mouth daily.   [name] aspirin (EQ ASPIRIN ADULT LOW DOSE) 81 MG EC tablet Take 1 tablet (81 mg total) by mouth daily. Swallow whole.  [name] atorvastatin (LIPITOR) 40 MG tablet TAKE 1 TABLET BY MOUTH ONCE DAILY  . BD PEN NEEDLE NANO U/F 32G X 4 MM MISC USE ONE  THREE TIMES DAILY  . Blood Pressure KIT 1 each by Does not apply route 2 (two) times daily.  . DULoxetine (CYMBALTA) 20 MG capsule Take 20 mg by mouth daily.  [name] glucose blood (ACCU-CHEK AVIVA PLUS) test strip 1 each by Other route 2 (two) times daily. And lancets 2/day  . hydrALAZINE (APRESOLINE) 50 MG tablet Take 1 tablet (50 mg total) by mouth 3 (three) times daily.  [name] HYDROcodone-acetaminophen (NORCO) 7.5-325 MG tablet Take 1 tablet by mouth 3 (three) times daily.  . Insulin Detemir (LEVEMIR FLEXTOUCH) 100 UNIT/ML Pen Inject 180 Units into the skin every morning.  . isosorbide mononitrate (IMDUR) 30 MG 24 hr tablet Take 1 tablet (30 mg total) by mouth daily.  [name] moxifloxacin (AVELOX) 400 MG tablet Take 1 tablet (400 mg total) by mouth daily.  . predniSONE (DELTASONE) 10 MG tablet Take 1 tablet (10 mg total) by  mouth daily.  . predniSONE (STERAPRED UNI-PAK 21 TAB) 5 MG (21) TBPK tablet Take 25 mg by mouth as directed.  . pregabalin (LYRICA) 100 MG capsule Take 1 capsule (100 mg total) by mouth 3 (three) times daily.  . ranitidine (ZANTAC) 150 MG tablet Take 1 tablet (150 mg total) by mouth daily before breakfast.  . Secukinumab (COSENTYX) 150 MG/ML SOSY Inject 150 mg into the skin every 28 (twenty-eight) days.   . sildenafil (REVATIO) 20 MG tablet Take 20 mg by mouth as needed (1-2 tablets as needed for ED).  [name] ULORIC 40 MG tablet Take 40 mg by mouth daily.  [name] zolpidem (AMBIEN CR) 6.25 MG CR tablet Take 1 tablet (6.25 mg total) by mouth at bedtime as needed for sleep. Each prescription to last 1 month.   Current Facility-Administered Medications for the 04/30/18 encounter (Office Visit) with [name] T, [name]    Medication  . 0.9 %  sodium chloride infusion     Allergies:   Patient has no known allergies.   Social History   Tobacco Use  . Smoking status: Former Smoker    Packs/day: 0.50    Years: 10.00    Pack years: 5.00    Types: Cigarettes    Last attempt to quit: 04/11/2008    Years since quitting: 10.0  . Smokeless tobacco: Never Used  Substance Use Topics  . Alcohol use: No    Alcohol/week: 0.0 standard drinks  . Drug use: No     Family Hx: The patient's family history includes COPD in his father and mother; Diabetes in his father and mother; Heart Problems in his father; Hypertension in his father and mother; Lung cancer in his mother. There is no history of Colon cancer or Stomach cancer.  ROS:   Please see the history of present illness.    Review of Systems  Constitution: Positive for diaphoresis and malaise/fatigue.  Cardiovascular: Positive for chest pain, dyspnea on exertion and leg swelling.  Respiratory: Positive for cough and shortness of breath.   Musculoskeletal: Positive for back pain, joint swelling and myalgias.   All other systems reviewed and are negative.   EKGs/Labs/Other Test Reviewed:    EKG:  EKG is  ordered today.  The ekg ordered today demonstrates sinus tachycardia, HR 103, LAD, TWI 1, 2, aVF, V4-6, QTc 444, similar to prior tracings.   Recent Labs: 01/29/2018: TSH 1.370 04/21/2018: ALT 23 04/29/2018: BNP WILL FOLLOW; BUN 18; Creatinine, Ser 1.43; Hemoglobin 14.4; Platelets 294; Potassium 3.4; Sodium 144   Recent Lipid Panel Lab Results  Component Value Date/Time   CHOL 222 (H) 04/21/2018 07:26 AM   TRIG 180 (H) 04/21/2018 07:26 AM   HDL 38 (L) 04/21/2018 07:26 AM   CHOLHDL 5.8 (H) 04/21/2018 07:26 AM   CHOLHDL 2.6 06/10/2016 12:53 PM   LDLCALC 148 (H) 04/21/2018 07:26 AM    Physical Exam:    VS:  BP (!) 142/90 (BP Location: Left Arm, Patient Position: Sitting, Cuff Size: Normal)   Pulse (!) 103   Ht '5\' 6"'$  (1.676 m)   Wt 220 lb 1.9 oz  (99.8 kg)   SpO2 92% Comment: at rest  BMI 35.53 kg/m     Wt Readings from Last 3 Encounters:  04/30/18 220 lb 1.9 oz (99.8 kg)  04/29/18 225 lb 6.4 oz (102.2 kg)  04/14/18 216 lb 12.8 oz (98.3 kg)     Physical Exam  Constitutional: He is oriented to person, place, and time. He appears well-developed and  well-nourished. No distress.  HENT:  Head: Normocephalic and atraumatic.  Eyes: No scleral icterus.  Neck: No JVD present. No thyromegaly present.  Cardiovascular: Normal rate and regular rhythm.  No murmur heard. Pulmonary/Chest: He has no wheezes. He has no rales.  Abdominal: Soft. He exhibits no distension.  Musculoskeletal: He exhibits edema (trace bilat LE edema).  Lymphadenopathy:    He has no cervical adenopathy.  Neurological: He is alert and oriented to person, place, and time.  Skin: Skin is warm and dry.  Psychiatric: He has a normal mood and affect.    ASSESSMENT & PLAN:    Chronic combined systolic and diastolic CHF (congestive heart failure) (HCC) EF 35-40.  NYHA 3a.  He has started back on Lasix 20 mg QD due to leg swelling.  His exam does not suggest significant volume excess.  A BNP is pending.  His most recent labs demonstrated low potassium.  I have asked him to change Lasix to as needed once his swelling is improved.  His Carvedilol was stopped last year in the setting of hypotension.  He was on Carvedilol 25 mg Twice daily at that time.  As he currently has bronchitis, I am not sure he can tolerate a non-selective beta-blocker.  [name] recently changed his angiotensin receptor blocker to hydralazine/nitrates.   -Change Lasix to 20 mg QD prn once swelling improved.    -Add K+ 10 mEq QD (take only when he takes Lasix)  -Continue current dose of Hydralazine/Isosorbide  -Start Bisoprolol 2.5 mg QD.  -Repeat BMET 1 week  Dilated cardiomyopathy ([place]) Cardiac Catheterization in 2017 demonstrated mild non-obstructive CAD.  His EF had returned to normal. It is  now 35-40 with inferior wall motion abnormalities.  [name] would like him to have a Coronary CTA with FFR.  His Creatinine has improved some.  However, he is tachycardic.  I suspect his sinus tachycardia is related to his current illness. I will order the CTA now as it will take a couple of weeks to get it scheduled.  He will need follow up in the next 1-2 weeks to recheck his HR and adjust medications as needed.    -Arrange Coronary CTA with FFR  Essential hypertension BP above target.  Add Bisoprolol 2.5 mg QD as noted.  Persistent atrial fibrillation (HCC) Hx of prior ablation in [place].  He is maintaining normal sinus rhythm.  He is no longer on anticoagulation due to anemia. [name] has referred him back to GI to see if he can be cleared to start anticoagulation.    CKD (chronic kidney disease) stage 3, GFR 30-59 ml/min (HCC)  Recent creatinine improved.  He will need another BMET prior to his CTA.   Dispo:  Return in about 2 weeks (around 05/14/2018) for Close Follow Up w/ [name], or PA/NP.   Medication Adjustments/Labs and Tests Ordered: Current medicines are reviewed at length with the patient today.  Concerns regarding medicines are outlined above.  Tests Ordered: Orders Placed This Encounter  Procedures  . CT CORONARY MORPH W/CTA COR W/SCORE W/CA W/CM &/OR WO/CM  . CT CORONARY FRACTIONAL FLOW RESERVE DATA PREP  . CT CORONARY FRACTIONAL FLOW RESERVE FLUID ANALYSIS  . Basic Metabolic Panel (BMET)  . EKG 12-Lead   Medication Changes: Meds ordered this encounter  Medications  . bisoprolol (ZEBETA) 5 MG tablet    Sig: Take 0.5 tablets (2.5 mg total) by mouth daily.    Dispense:  30 tablet    Refill:  11  . potassium chloride (K-DUR) 10 MEQ tablet    Sig: Only take when you take the lasix    Dispense:  90 tablet    Refill:  0  . DISCONTD: furosemide (LASIX) 20 MG tablet    Sig: Take once a day AS NEEDED for swelling    Dispense:  90 tablet    Refill:  0  .  metoprolol tartrate (LOPRESSOR) 50 MG tablet    Sig: Take 1 tablet (50 mg total) by mouth once for 1 dose.    Dispense:  1 tablet    Refill:  0    THIS IS TO BE TAKEN 1 HOUR BEFORE CORONARY CT  . furosemide (LASIX) 20 MG tablet    Sig: Take once a day AS NEEDED for swelling    Dispense:  90 tablet    Refill:  0    Signed, [name], [name]  04/30/2018 2:18 PM    [facility] [place], [place], [place]  [address] Phone: [phone]; Fax: [phone]

## 2018-04-30 NOTE — Telephone Encounter (Signed)
Returned call to pt. He states his PCP has already addressed medication concerns.

## 2018-04-30 NOTE — Patient Instructions (Addendum)
Medication Instructions:  1. CHANGE LASIX 20 MG DAILY; ONCE THE SWELLING IS BETTER YOU WILL TAKE ONLY AS NEEDED FOR SWELLING.  2. TAKE POTASSIUM 10 MEQ ONLY WHEN YOU TAKE THE LASIX   Labwork: BMET TO BE DONE IN 1 WEEK  Testing/Procedures: CORONARY CTA W/FFR TO BE DONE AT [place]   Follow-Up: IN 1-2 WEEKS FOLLOW UP WITH [name] OR APP ON CARE TEAM, IF NOTHING AVAILABLE THEN WITH [facility],PAC SAME DAY [name] IS IN THE OFFICE   Any Other Special Instructions Will Be Listed Below (If Applicable).  If you need a refill on your cardiac medications before your next appointment, please call your pharmacy.  Please arrive at the [facility] main entrance of [facility] at xx:xx AM (30-45 minutes prior to test start time)  [facility] [place], [place] [address] [phone]  Proceed to the [facility] Radiology Department (First Floor).  Please follow these instructions carefully (unless otherwise directed):  Hold all erectile dysfunction medications at least 48 hours prior to test.  On the Night Before the Test: . Drink plenty of water. . Do not consume any caffeinated/decaffeinated beverages or chocolate 12 hours prior to your test. . Do not take any antihistamines 12 hours prior to your test. . If you take Metformin do not take 24 hours prior to test. . HOLD LEVEMIR THE MORNING OF CT . If the patient has contrast allergy: ? Patient will need a prescription for Prednisone and very clear instructions (as follows): 1. Prednisone 50 mg - take 13 hours prior to test 2. Take another Prednisone 50 mg 7 hours prior to test 3. Take another Prednisone 50 mg 1 hour prior to test 4. Take Benadryl 50 mg 1 hour prior to test . Patient must complete all four doses of above prophylactic medications. . Patient will need a ride after test due to Benadryl.  On the Day of the Test: . Drink plenty of water. Do not drink any water within one hour of  the test. . Do not eat any food 4 hours prior to the test. . You may take your regular medications prior to the test. . IF NOT ON A BETA BLOCKER - Take 50 mg of lopressor (metoprolol) one hour before the test. . HOLD LASIX  AND POTASSIUM THE MORNING OF TEST.   After the Test: . Drink plenty of water. . After receiving IV contrast, you may experience a mild flushed feeling. This is normal. . On occasion, you may experience a mild rash up to 24 hours after the test. This is not dangerous. If this occurs, you can take Benadryl 25 mg and increase your fluid intake. . If you experience trouble breathing, this can be serious. If it is severe call 911 IMMEDIATELY. If it is mild, please call our office. . If you take any of these medications: Glipizide/Metformin, Avandament, Glucavance, please do not take 48 hours after completing test.

## 2018-05-01 NOTE — Progress Notes (Signed)
yes

## 2018-05-03 ENCOUNTER — Telehealth: Payer: Self-pay | Admitting: Cardiology

## 2018-05-03 MED ORDER — FUROSEMIDE 20 MG PO TABS: 20.0000 mg | ORAL_TABLET | ORAL | 0 refills | Status: DC | PRN

## 2018-05-03 MED ORDER — FUROSEMIDE 20 MG PO TABS: 20.0000 mg | ORAL_TABLET | Freq: Every day | ORAL | 0 refills | Status: DC

## 2018-05-03 MED ORDER — POTASSIUM CHLORIDE ER 10 MEQ PO TBCR: 10.0000 meq | EXTENDED_RELEASE_TABLET | Freq: Every day | ORAL | 3 refills | Status: DC

## 2018-05-03 NOTE — Telephone Encounter (Signed)
New message ° ° °Patient is returning your call. °

## 2018-05-03 NOTE — Telephone Encounter (Signed)
Spoke with patient and he accepted the dose change for potassium and had no further questions.       Notes recorded by Dorothy Spark, MD on 04/30/2018 at 4:40 PM EDT Improved Crea, K still low, please start KCl 10 mEq daily

## 2018-05-06 ENCOUNTER — Telehealth: Payer: Self-pay | Admitting: Endocrinology

## 2018-05-06 NOTE — Telephone Encounter (Signed)
Pt. Was informed to let [name] know about blood sugar. "Blood sugar has been good ranging between 90-175."

## 2018-05-06 NOTE — Telephone Encounter (Signed)
FYI

## 2018-05-06 NOTE — Telephone Encounter (Signed)
OK, Please continue the same insulin I'll see you next time.

## 2018-05-11 ENCOUNTER — Ambulatory Visit: Payer: Medicaid Other | Admitting: Physician Assistant

## 2018-05-11 ENCOUNTER — Other Ambulatory Visit: Payer: Medicaid Other

## 2018-05-18 ENCOUNTER — Emergency Department (HOSPITAL_COMMUNITY): Payer: Medicaid Other

## 2018-05-18 ENCOUNTER — Inpatient Hospital Stay (HOSPITAL_COMMUNITY)
Admission: EM | Admit: 2018-05-18 | Discharge: 2018-05-22 | DRG: 286 | Disposition: A | Discharge status: Home / Self Care | Payer: Medicaid Other | Attending: Cardiology | Admitting: Cardiology

## 2018-05-18 ENCOUNTER — Encounter (HOSPITAL_COMMUNITY): Payer: Self-pay | Admitting: *Deleted

## 2018-05-18 ENCOUNTER — Other Ambulatory Visit: Payer: Self-pay

## 2018-05-18 DIAGNOSIS — I5023 Acute on chronic systolic (congestive) heart failure: Secondary | ICD-10-CM

## 2018-05-18 DIAGNOSIS — N183 Chronic kidney disease, stage 3 (moderate): Secondary | ICD-10-CM | POA: Diagnosis present

## 2018-05-18 DIAGNOSIS — I214 Non-ST elevation (NSTEMI) myocardial infarction: Secondary | ICD-10-CM

## 2018-05-18 DIAGNOSIS — I480 Paroxysmal atrial fibrillation: Secondary | ICD-10-CM | POA: Diagnosis present

## 2018-05-18 DIAGNOSIS — I10 Essential (primary) hypertension: Secondary | ICD-10-CM | POA: Diagnosis present

## 2018-05-18 DIAGNOSIS — E876 Hypokalemia: Secondary | ICD-10-CM | POA: Diagnosis not present

## 2018-05-18 DIAGNOSIS — F419 Anxiety disorder, unspecified: Secondary | ICD-10-CM | POA: Diagnosis present

## 2018-05-18 DIAGNOSIS — I509 Heart failure, unspecified: Secondary | ICD-10-CM

## 2018-05-18 DIAGNOSIS — Z79899 Other long term (current) drug therapy: Secondary | ICD-10-CM

## 2018-05-18 DIAGNOSIS — Z8249 Family history of ischemic heart disease and other diseases of the circulatory system: Secondary | ICD-10-CM

## 2018-05-18 DIAGNOSIS — Z7982 Long term (current) use of aspirin: Secondary | ICD-10-CM

## 2018-05-18 DIAGNOSIS — N189 Chronic kidney disease, unspecified: Secondary | ICD-10-CM | POA: Diagnosis not present

## 2018-05-18 DIAGNOSIS — M109 Gout, unspecified: Secondary | ICD-10-CM | POA: Diagnosis present

## 2018-05-18 DIAGNOSIS — Z87891 Personal history of nicotine dependence: Secondary | ICD-10-CM

## 2018-05-18 DIAGNOSIS — E1122 Type 2 diabetes mellitus with diabetic chronic kidney disease: Secondary | ICD-10-CM | POA: Diagnosis present

## 2018-05-18 DIAGNOSIS — I4581 Long QT syndrome: Secondary | ICD-10-CM | POA: Diagnosis present

## 2018-05-18 DIAGNOSIS — K219 Gastro-esophageal reflux disease without esophagitis: Secondary | ICD-10-CM | POA: Diagnosis present

## 2018-05-18 DIAGNOSIS — I5043 Acute on chronic combined systolic (congestive) and diastolic (congestive) heart failure: Secondary | ICD-10-CM | POA: Diagnosis present

## 2018-05-18 DIAGNOSIS — I25110 Atherosclerotic heart disease of native coronary artery with unstable angina pectoris: Principal | ICD-10-CM | POA: Diagnosis present

## 2018-05-18 DIAGNOSIS — Z7952 Long term (current) use of systemic steroids: Secondary | ICD-10-CM

## 2018-05-18 DIAGNOSIS — Z794 Long term (current) use of insulin: Secondary | ICD-10-CM

## 2018-05-18 DIAGNOSIS — M069 Rheumatoid arthritis, unspecified: Secondary | ICD-10-CM | POA: Diagnosis present

## 2018-05-18 DIAGNOSIS — I130 Hypertensive heart and chronic kidney disease with heart failure and stage 1 through stage 4 chronic kidney disease, or unspecified chronic kidney disease: Secondary | ICD-10-CM | POA: Diagnosis present

## 2018-05-18 DIAGNOSIS — E1140 Type 2 diabetes mellitus with diabetic neuropathy, unspecified: Secondary | ICD-10-CM | POA: Diagnosis present

## 2018-05-18 DIAGNOSIS — E1165 Type 2 diabetes mellitus with hyperglycemia: Secondary | ICD-10-CM

## 2018-05-18 DIAGNOSIS — E785 Hyperlipidemia, unspecified: Secondary | ICD-10-CM | POA: Diagnosis present

## 2018-05-18 DIAGNOSIS — R0781 Pleurodynia: Secondary | ICD-10-CM

## 2018-05-18 DIAGNOSIS — I248 Other forms of acute ischemic heart disease: Secondary | ICD-10-CM

## 2018-05-18 DIAGNOSIS — R000 Tachycardia, unspecified: Secondary | ICD-10-CM | POA: Diagnosis not present

## 2018-05-18 DIAGNOSIS — R110 Nausea: Secondary | ICD-10-CM | POA: Diagnosis not present

## 2018-05-18 DIAGNOSIS — I428 Other cardiomyopathies: Secondary | ICD-10-CM | POA: Diagnosis present

## 2018-05-18 DIAGNOSIS — I2489 Other forms of acute ischemic heart disease: Secondary | ICD-10-CM

## 2018-05-18 DIAGNOSIS — IMO0002 Reserved for concepts with insufficient information to code with codable children: Secondary | ICD-10-CM | POA: Diagnosis present

## 2018-05-18 DIAGNOSIS — G4733 Obstructive sleep apnea (adult) (pediatric): Secondary | ICD-10-CM | POA: Diagnosis present

## 2018-05-18 DIAGNOSIS — I2510 Atherosclerotic heart disease of native coronary artery without angina pectoris: Secondary | ICD-10-CM | POA: Diagnosis present

## 2018-05-18 DIAGNOSIS — R079 Chest pain, unspecified: Secondary | ICD-10-CM | POA: Diagnosis present

## 2018-05-18 LAB — BRAIN NATRIURETIC PEPTIDE: B NATRIURETIC PEPTIDE 5: 412.9 pg/mL — AB (ref 0.0–100.0)

## 2018-05-18 LAB — CBC
HCT: 43.9 % (ref 39.0–52.0)
Hemoglobin: 13.1 g/dL (ref 13.0–17.0)
MCH: 28.5 pg (ref 26.0–34.0)
MCHC: 29.8 g/dL — ABNORMAL LOW (ref 30.0–36.0)
MCV: 95.4 fL (ref 80.0–100.0)
NRBC: 0 % (ref 0.0–0.2)
PLATELETS: 224 10*3/uL (ref 150–400)
RBC: 4.6 MIL/uL (ref 4.22–5.81)
RDW: 15.1 % (ref 11.5–15.5)
WBC: 9.1 10*3/uL (ref 4.0–10.5)

## 2018-05-18 LAB — BASIC METABOLIC PANEL
ANION GAP: 10 (ref 5–15)
BUN: 17 mg/dL (ref 8–23)
CALCIUM: 8.5 mg/dL — AB (ref 8.9–10.3)
CO2: 25 mmol/L (ref 22–32)
Chloride: 106 mmol/L (ref 98–111)
Creatinine, Ser: 1.46 mg/dL — ABNORMAL HIGH (ref 0.61–1.24)
GFR, EST AFRICAN AMERICAN: 57 mL/min — AB (ref >60)
GFR, EST NON AFRICAN AMERICAN: 49 mL/min — AB (ref >60)
GLUCOSE: 238 mg/dL — AB (ref 70–99)
Potassium: 2.9 mmol/L — ABNORMAL LOW (ref 3.5–5.1)
SODIUM: 141 mmol/L (ref 135–145)

## 2018-05-18 LAB — TROPONIN I: TROPONIN I: 0.16 ng/mL — AB (ref <0.03)

## 2018-05-18 LAB — I-STAT TROPONIN, ED: Troponin i, poc: 0.25 ng/mL (ref 0.00–0.08)

## 2018-05-18 MED ORDER — ISOSORBIDE MONONITRATE ER 30 MG PO TB24: 30.0000 mg | ORAL_TABLET | Freq: Every day | ORAL | Status: DC

## 2018-05-18 MED ORDER — POTASSIUM CHLORIDE CRYS ER 20 MEQ PO TBCR
40.0000 meq | EXTENDED_RELEASE_TABLET | Freq: Once | ORAL | Status: AC
  Administered 2018-05-18: 40 meq via ORAL
  Filled 2018-05-18: qty 2

## 2018-05-18 MED ORDER — ASPIRIN EC 81 MG PO TBEC
81.0000 mg | DELAYED_RELEASE_TABLET | Freq: Every day | ORAL | Status: DC
  Administered 2018-05-20 – 2018-05-22 (×3): 81 mg via ORAL
  Filled 2018-05-18 (×3): qty 1

## 2018-05-18 MED ORDER — ATORVASTATIN CALCIUM 40 MG PO TABS
40.0000 mg | ORAL_TABLET | Freq: Every day | ORAL | Status: DC
  Administered 2018-05-18 – 2018-05-21 (×4): 40 mg via ORAL
  Filled 2018-05-18 (×4): qty 1

## 2018-05-18 MED ORDER — ZOLPIDEM TARTRATE 5 MG PO TABS
5.0000 mg | ORAL_TABLET | Freq: Every evening | ORAL | Status: DC | PRN
  Administered 2018-05-20 – 2018-05-21 (×2): 5 mg via ORAL
  Filled 2018-05-18 (×2): qty 1

## 2018-05-18 MED ORDER — HEPARIN (PORCINE) IN NACL 100-0.45 UNIT/ML-% IJ SOLN
1200.0000 [IU]/h | INTRAMUSCULAR | Status: DC
  Administered 2018-05-18: 1200 [IU]/h via INTRAVENOUS
  Filled 2018-05-18 (×2): qty 250

## 2018-05-18 MED ORDER — FUROSEMIDE 20 MG PO TABS: 20.0000 mg | ORAL_TABLET | ORAL | Status: DC | PRN

## 2018-05-18 MED ORDER — ASPIRIN 81 MG PO CHEW
81.0000 mg | CHEWABLE_TABLET | ORAL | Status: AC
  Administered 2018-05-19: 81 mg via ORAL
  Filled 2018-05-18: qty 1

## 2018-05-18 MED ORDER — IOPAMIDOL (ISOVUE-370) INJECTION 76%
INTRAVENOUS | Status: AC
  Filled 2018-05-18: qty 100

## 2018-05-18 MED ORDER — FEBUXOSTAT 40 MG PO TABS
40.0000 mg | ORAL_TABLET | Freq: Every day | ORAL | Status: DC
  Administered 2018-05-19 – 2018-05-22 (×4): 40 mg via ORAL
  Filled 2018-05-18 (×4): qty 1

## 2018-05-18 MED ORDER — SODIUM CHLORIDE 0.9 % WEIGHT BASED INFUSION
3.0000 mL/kg/h | INTRAVENOUS | Status: DC
  Administered 2018-05-19: 3 mL/kg/h via INTRAVENOUS

## 2018-05-18 MED ORDER — SODIUM CHLORIDE 0.9 % IV SOLN: 250.0000 mL | INTRAVENOUS | Status: DC | PRN

## 2018-05-18 MED ORDER — FUROSEMIDE 20 MG PO TABS: 20.0000 mg | ORAL_TABLET | Freq: Every day | ORAL | Status: DC | PRN

## 2018-05-18 MED ORDER — DULOXETINE HCL 20 MG PO CPEP
20.0000 mg | ORAL_CAPSULE | Freq: Every day | ORAL | Status: DC
  Administered 2018-05-19 – 2018-05-22 (×4): 20 mg via ORAL
  Filled 2018-05-18 (×4): qty 1

## 2018-05-18 MED ORDER — SODIUM CHLORIDE 0.9% FLUSH: 3.0000 mL | Freq: Two times a day (BID) | INTRAVENOUS | Status: DC

## 2018-05-18 MED ORDER — HYDRALAZINE HCL 50 MG PO TABS
50.0000 mg | ORAL_TABLET | Freq: Three times a day (TID) | ORAL | Status: DC
  Administered 2018-05-18 – 2018-05-19 (×2): 50 mg via ORAL
  Filled 2018-05-18 (×2): qty 1

## 2018-05-18 MED ORDER — ALBUTEROL SULFATE (2.5 MG/3ML) 0.083% IN NEBU: 3.0000 mL | INHALATION_SOLUTION | Freq: Four times a day (QID) | RESPIRATORY_TRACT | Status: DC | PRN

## 2018-05-18 MED ORDER — NITROGLYCERIN IN D5W 200-5 MCG/ML-% IV SOLN
0.0000 ug/min | INTRAVENOUS | Status: DC
  Administered 2018-05-18: 5 ug/min via INTRAVENOUS
  Filled 2018-05-18: qty 250

## 2018-05-18 MED ORDER — SODIUM CHLORIDE 0.9% FLUSH: 3.0000 mL | INTRAVENOUS | Status: DC | PRN

## 2018-05-18 MED ORDER — SODIUM CHLORIDE 0.9 % WEIGHT BASED INFUSION: 1.0000 mL/kg/h | INTRAVENOUS | Status: DC

## 2018-05-18 MED ORDER — ACETAMINOPHEN 325 MG PO TABS
650.0000 mg | ORAL_TABLET | ORAL | Status: DC | PRN
  Administered 2018-05-19 – 2018-05-22 (×3): 650 mg via ORAL
  Filled 2018-05-18 (×3): qty 2

## 2018-05-18 MED ORDER — ONDANSETRON HCL 4 MG/2ML IJ SOLN: 4.0000 mg | Freq: Four times a day (QID) | INTRAMUSCULAR | Status: DC | PRN

## 2018-05-18 MED ORDER — HEPARIN BOLUS VIA INFUSION
4000.0000 [IU] | Freq: Once | INTRAVENOUS | Status: AC
  Administered 2018-05-18: 4000 [IU] via INTRAVENOUS
  Filled 2018-05-18: qty 4000

## 2018-05-18 MED ORDER — NITROGLYCERIN 0.4 MG SL SUBL: 0.4000 mg | SUBLINGUAL_TABLET | SUBLINGUAL | Status: DC | PRN

## 2018-05-18 MED ORDER — HYDROCODONE-ACETAMINOPHEN 7.5-325 MG PO TABS
1.0000 | ORAL_TABLET | Freq: Three times a day (TID) | ORAL | Status: DC
  Administered 2018-05-18 – 2018-05-22 (×11): 1 via ORAL
  Filled 2018-05-18 (×11): qty 1

## 2018-05-18 NOTE — Progress Notes (Signed)
ANTICOAGULATION CONSULT NOTE - Initial Consult  Pharmacy Consult for heparin  Indication: chest pain/ACS  No Known Allergies  Patient Measurements: Weight: 220 lb (99.8 kg) Heparin Dosing Weight: 88kg  Vital Signs: BP: 156/101 (10/08 1830) Pulse Rate: 86 (10/08 1830)  Labs: Recent Labs    05/18/18 1443  HGB 13.1  HCT 43.9  PLT 224  CREATININE 1.46*    Estimated Creatinine Clearance: 57.3 mL/min (A) (by C-G formula based on SCr of 1.46 mg/dL (H)).   Medical History: Past Medical History:  Diagnosis Date  . Benign colon polyp 08/01/2013   [facility] in [place]. large base tranverse colon polyp was biopsied. polyp was benign with minimal surface hyperplastic change.  . Chronic combined systolic and diastolic CHF (congestive heart failure) ([place])   . Chronic pain   . CKD (chronic kidney disease), stage III ([place])   . Diabetes mellitus without complication ([place])   . Diverticulosis   . Esophageal hiatal hernia 07/29/2013   confirmed on EGD   . Esophageal stricture   . Essential hypertension   . Gastritis 07/29/2013   confirmed on EGD, bx done an negative for intestinal metaplasia, dsyplasia or H. pylori. normal gastric emptying study done 07/13/2013.  [name] GERD (gastroesophageal reflux disease)   . Morbid obesity ([place])   . Non-obstructive CAD    a. 02/2013 Cath ([place]): nonobs dzs;  b. 09/2014 Myoview ([place]): EF 55%, no ischemia;  c. 12/2014 Echo: EF 50-55%, gr1 DD, no effusion.  . Persistent atrial fibrillation    a. 02/2013 s/p rfca in [place], [place]-->prev on Xarelto, d/c'd 2/2 anemia, ? GIB.  [name] Rheumatoid arthritis ([place])   . Sigmoid diverticulosis 08/01/2013   confirmed on colonscopy. record scanned into chart   Assessment: 62 yo BM with history of nonischemic CM, nonobstructive CAD, DM, HTN presents with progressive chest pain. Pharmacy asked to start IV heparin for nstemi. CBC within normal limits, no anticoagulation prior to admit.   Goal of Therapy:   Heparin level 0.3-0.7 units/ml Monitor platelets by anticoagulation protocol: Yes   Plan:  Give 4000 units bolus x 1 Start heparin infusion at 1200 units/hr Check anti-Xa level in 6 hours and daily while on heparin Continue to monitor H&H and platelets  Erin Hearing PharmD., BCPS Clinical Pharmacist 05/18/2018 7:15 PM

## 2018-05-18 NOTE — ED Provider Notes (Signed)
[facility] EMERGENCY DEPARTMENT Provider Note   CSN: [id number] Arrival date & time: 05/18/18  1358     History   Chief Complaint Chief Complaint  Patient presents with  . Chest Pain    HPI Marco [name] is a 62 y.o. male.  Marco [name] is a 62 y.o. Male history of hypertension, hyperlipidemia, CAD,  CHF, A. fib, CKD, diabetes, who presents to the emergency department for evaluation of left-sided chest pains and dyspnea on exertion.  Patient reports for the last 2 to 3 days he has had intermittent sharp pains that he describes as a squeezing tightness over the left side of his chest associated with worsening shortness of breath.  He reports he becomes extremely short of breath with minimal activity, and this also worsens when he tries to lay flat.  He reports this feels similar to when he had fluid on his lungs from his heart failure in the past.  He reports he does take a fluid pill but his cardiologist has been decreasing the dose and frequency of this due to issues with your kidney function.  Patient reports he has had some mild swelling in his lower extremities but this seems to be improved.  No redness, warmth or unilateral swelling over one leg.  She denies feeling lightheaded, dizzy or any syncopal episodes.  He has had some intermittent diaphoresis, no nausea or vomiting and no abdominal pain.  He does report he has had some coughing intermittently, productive of mucus with some blood noted in the sputum which is atypical for him.  He has not had any fevers or chills.  He denies any history of blood clots, was previously on anticoagulation for his A. fib but this had to be discontinued due to issues with bleeding, patient does take daily aspirin.     Past Medical History:  Diagnosis Date  . Benign colon polyp 08/01/2013   [facility] in [place]. large base tranverse colon polyp was biopsied. polyp was benign with minimal surface hyperplastic change.   . Chronic combined systolic and diastolic CHF (congestive heart failure) ([place])   . Chronic pain   . CKD (chronic kidney disease), stage III ([place])   . Diabetes mellitus without complication ([place])   . Diverticulosis   . Esophageal hiatal hernia 07/29/2013   confirmed on EGD   . Esophageal stricture   . Essential hypertension   . Gastritis 07/29/2013   confirmed on EGD, bx done an negative for intestinal metaplasia, dsyplasia or H. pylori. normal gastric emptying study done 07/13/2013.  [name] GERD (gastroesophageal reflux disease)   . Morbid obesity ([place])   . Non-obstructive CAD    a. 02/2013 Cath ([place]): nonobs dzs;  b. 09/2014 Myoview ([place]): EF 55%, no ischemia;  c. 12/2014 Echo: EF 50-55%, gr1 DD, no effusion.  . Persistent atrial fibrillation    a. 02/2013 s/p rfca in [place], [place]-->prev on Xarelto, d/c'd 2/2 anemia, ? GIB.  [name] Rheumatoid arthritis ([place])   . Sigmoid diverticulosis 08/01/2013   confirmed on colonscopy. record scanned into chart    Patient Active Problem List   Diagnosis Date Noted  . Renal failure (ARF), acute on chronic (HCC) 08/27/2017  . Dysphagia 01/09/2017  . Hyperlipidemia 12/23/2016  . Chronic combined systolic and diastolic CHF (congestive heart failure) ([place]) 09/09/2016  . Diabetes mellitus with complication ([place])   . Diabetic retinopathy ([place]) 07/22/2016  . Diabetic polyneuropathy associated with type 2 diabetes mellitus ([place]) 06/10/2016  . Myalgia and  myositis 03/31/2016  . Dyspnea on exertion 02/09/2016  . Chronic gouty arthritis 11/29/2015  . Ache in joint 11/27/2015  . LBP (low back pain) 11/27/2015  . Arthralgia of multiple joints 11/27/2015  . Psoriasis 11/27/2015  . Breast pain in male 11/14/2015  . Sinusitis, chronic 10/16/2015  . Insomnia 10/16/2015  . Adult BMI 30+ 10/04/2015  . New onset of headaches after age 50 09/20/2015  . OSA (obstructive sleep apnea) 07/12/2015  . Low serum testosterone level 05/18/2015  . Depression 05/16/2015  .  Chronic pain   . CAD (coronary artery disease) 12/24/2014  . Atrial fibrillation ([place]) 12/24/2014  . Essential hypertension 12/24/2014  . CKD (chronic kidney disease) stage 3, GFR 30-59 ml/min (HCC) 12/24/2014  . PUD (peptic ulcer disease) 12/24/2014    Past Surgical History:  Procedure Laterality Date  . CARDIAC CATHETERIZATION  05/2014   ablation for atrial fibrillation  . CARDIAC CATHETERIZATION N/A 11/16/2015   Procedure: Left Heart Cath and Coronary Angiography;  Surgeon: Leonie Man, MD;  Location: [facility];  Service: Cardiovascular;  Laterality: N/A;  . COLON RESECTION  09/2013   due to large, abnormal polpy. non cancerous per patient.   . COLON SURGERY  09/2014   colon resection         Home Medications    Prior to Admission medications   Medication Sig Start Date End Date Taking? Authorizing Provider  allopurinol (ZYLOPRIM) 100 MG tablet Take 200 mg by mouth daily.  04/15/18  Yes [provider]  aspirin (EQ ASPIRIN ADULT LOW DOSE) 81 MG EC tablet Take 1 tablet (81 mg total) by mouth daily. Swallow whole. Patient taking differently: Take 81 mg by mouth daily.  02/04/18  Yes [name], MD  atorvastatin (LIPITOR) 40 MG tablet TAKE 1 TABLET BY MOUTH ONCE DAILY Patient taking differently: Take 40 mg by mouth daily.  04/14/18  Yes [name], MD  DULoxetine (CYMBALTA) 20 MG capsule Take 20 mg by mouth daily.   Yes [provider]  furosemide (LASIX) 20 MG tablet Take 1 tablet (20 mg total) by mouth as needed for edema. 05/03/18  Yes Turner, Eber Hong, MD  hydrALAZINE (APRESOLINE) 50 MG tablet Take 1 tablet (50 mg total) by mouth 3 (three) times daily. 04/29/18  Yes [name], MD  HYDROcodone-acetaminophen (NORCO) 7.5-325 MG tablet Take 1 tablet by mouth 3 (three) times daily. 03/21/18  Yes [provider]  Insulin Detemir (LEVEMIR FLEXTOUCH) 100 UNIT/ML Pen Inject 180 Units into the skin every morning. 04/14/18  Yes Renato Shin, MD  isosorbide mononitrate (IMDUR) 30 MG 24 hr tablet Take 1 tablet (30 mg total) by mouth daily. 04/21/18 04/21/19 Yes Turner, Eber Hong, MD  potassium chloride (K-DUR) 10 MEQ tablet Take 1 tablet (10 mEq total) by mouth daily. 05/03/18 05/03/19 Yes Turner, Eber Hong, MD  predniSONE (DELTASONE) 10 MG tablet Take 1 tablet (10 mg total) by mouth daily. 02/22/18  Yes [name], MD  pregabalin (LYRICA) 100 MG capsule Take 1 capsule (100 mg total) by mouth 3 (three) times daily. 02/05/17  Yes Funches, Adriana Mccallum, MD  ranitidine (ZANTAC) 150 MG tablet Take 1 tablet (150 mg total) by mouth daily before breakfast. 02/22/18  Yes [name], MD  Secukinumab (COSENTYX) 150 MG/ML SOSY Inject 150 mg into the skin every 28 (twenty-eight) days.  06/02/16  Yes [provider]  sildenafil (REVATIO) 20 MG tablet Take 20-40 mg by mouth as needed (for erectile dysfunction).  Yes [provider]  ULORIC 40 MG tablet Take 40 mg by mouth daily. 03/31/18  Yes [provider]  zolpidem (AMBIEN CR) 6.25 MG CR tablet Take 1 tablet (6.25 mg total) by mouth at bedtime as needed for sleep. Each prescription to last 1 month. Patient taking differently: Take 6.25 mg by mouth at bedtime as needed for sleep.  04/29/18  Yes [name], MD  ACCU-CHEK FASTCLIX LANCETS MISC USE AS DIRECTED TO  CHECK  BLOOD  SUGAR  3  TIMES  DAILY 04/16/18   [name], MD  albuterol (PROVENTIL HFA;VENTOLIN HFA) 108 (90 Base) MCG/ACT inhaler Inhale 2 puffs into the lungs every 6 (six) hours as needed for wheezing or shortness of breath. Patient not taking: Reported on 05/18/2018 04/29/18   [name], MD  BD PEN NEEDLE NANO U/F 32G X 4 MM MISC USE ONE  THREE TIMES DAILY 09/24/17   [name], MD  bisoprolol (ZEBETA) 5 MG tablet Take 0.5 tablets (2.5 mg total) by mouth daily. Patient not taking: Reported on 05/18/2018 04/30/18   [name] T, [name]  Blood Pressure KIT 1 each by Does not apply  route 2 (two) times daily. 09/09/16   Funches, Adriana Mccallum, MD  glucose blood (ACCU-CHEK AVIVA PLUS) test strip 1 each by Other route 2 (two) times daily. And lancets 2/day 04/14/18   Renato Shin, MD  metoprolol tartrate (LOPRESSOR) 50 MG tablet Take 1 tablet (50 mg total) by mouth once for 1 dose. Patient not taking: Reported on 05/18/2018 04/30/18 04/30/18  Liliane Shi, [name]    Family History Family History  Problem Relation Age of Onset  . Hypertension Mother   . Diabetes Mother   . COPD Mother   . Lung cancer Mother        Smoker   . Hypertension Father   . Diabetes Father   . Heart Problems Father   . COPD Father   . Colon cancer Neg Hx   . Stomach cancer Neg Hx     Social History Social History   Tobacco Use  . Smoking status: Former Smoker    Packs/day: 0.50    Years: 10.00    Pack years: 5.00    Types: Cigarettes    Last attempt to quit: 04/11/2008    Years since quitting: 10.1  . Smokeless tobacco: Never Used  Substance Use Topics  . Alcohol use: No    Alcohol/week: 0.0 standard drinks  . Drug use: No     Allergies   Patient has no known allergies.   Review of Systems Review of Systems  Constitutional: Positive for diaphoresis. Negative for chills and fever.  HENT: Negative.   Eyes: Negative for visual disturbance.  Respiratory: Positive for cough, chest tightness and shortness of breath. Negative for wheezing.   Cardiovascular: Positive for chest pain and leg swelling. Negative for palpitations.  Gastrointestinal: Negative for abdominal pain, nausea and vomiting.  Genitourinary: Negative for dysuria and frequency.  Musculoskeletal: Negative for arthralgias and myalgias.  Skin: Negative for color change and rash.  Neurological: Negative for dizziness, syncope, facial asymmetry, weakness, light-headedness, numbness and headaches.     Physical Exam Updated Vital Signs BP (!) 141/94   Pulse 85   Resp 16   Wt 99.8 kg   SpO2 96%   BMI 35.51 kg/m    Physical Exam  Constitutional: He is oriented to person, place, and time. He appears well-developed and well-nourished. He does not appear ill. No  distress.  HENT:  Head: Normocephalic and atraumatic.  Eyes: Pupils are equal, round, and reactive to light. EOM are normal. Right eye exhibits no discharge. Left eye exhibits no discharge.  Neck: Neck supple. No JVD present. No tracheal deviation present.  Cardiovascular: Normal rate and regular rhythm. Exam reveals no gallop and no friction rub.  No murmur heard. Pulses:      Radial pulses are 2+ on the right side, and 2+ on the left side.       Dorsalis pedis pulses are 2+ on the right side, and 2+ on the left side.       Posterior tibial pulses are 2+ on the right side, and 2+ on the left side.  Pulmonary/Chest: Effort normal and breath sounds normal. No respiratory distress.  Respirations equal and unlabored, patient able to speak in full sentences, lungs with some crackles scattered throughout with diminished lung sounds in right lower field  Abdominal: Soft. Bowel sounds are normal. He exhibits no distension and no mass. There is no tenderness. There is no guarding.  Musculoskeletal:  Bilateral lower extremities with 1+ pitting edema up to the shin, no erythema or warmth  Neurological: He is alert and oriented to person, place, and time. Coordination normal.  Alert, following commands Moving all extremities without difficulty  Skin: Skin is warm and dry. He is not diaphoretic.  Psychiatric: He has a normal mood and affect. His behavior is normal.  Nursing note and vitals reviewed.    ED Treatments / Results  Labs (all labs ordered are listed, but only abnormal results are displayed) Labs Reviewed  BASIC METABOLIC PANEL - Abnormal; Notable for the following components:      Result Value   Potassium 2.9 (*)    Glucose, Bld 238 (*)    Creatinine, Ser 1.46 (*)    Calcium 8.5 (*)    GFR calc non Af Amer 49 (*)    GFR calc Af Amer  57 (*)    All other components within normal limits  CBC - Abnormal; Notable for the following components:   MCHC 29.8 (*)    All other components within normal limits  BRAIN NATRIURETIC PEPTIDE - Abnormal; Notable for the following components:   B Natriuretic Peptide 412.9 (*)    All other components within normal limits  I-STAT TROPONIN, ED - Abnormal; Notable for the following components:   Troponin i, poc 0.25 (*)    All other components within normal limits    EKG EKG Interpretation  Date/Time:  Tuesday May 18 2018 14:11:03 EDT Ventricular Rate:  87 PR Interval:  116 QRS Duration: 90 QT Interval:  446 QTC Calculation: 536 R Axis:   3 Text Interpretation:  Sinus rhythm with occasional Premature ventricular complexes and Possible Premature atrial complexes with Abberant conduction Minimal voltage criteria for LVH, may be normal variant Cannot rule out Anterior infarct , age undetermined T wave abnormality, consider inferolateral ischemia , new since last tracing Prolonged QT Abnormal ECG Confirmed by Dorie Rank [phone]) on 05/18/2018 4:42:07 PM   Radiology Dg Chest 2 View  Result Date: 05/18/2018 CLINICAL DATA:  Chest pain EXAM: CHEST - 2 VIEW COMPARISON:  04/29/2018 FINDINGS: Heart size upper normal. Negative for heart failure. No focal consolidation. Small right effusion has developed in the interval. IMPRESSION: Small right pleural effusion without evidence of pulmonary edema. Electronically Signed   By: Franchot Gallo M.D.   On: 05/18/2018 15:20    Procedures .Critical Care Performed by: Jacqlyn Larsen,  [name] Authorized by: Jacqlyn Larsen, [name]   Critical care provider statement:    Critical care time (minutes):  45   Critical care was necessary to treat or prevent imminent or life-threatening deterioration of the following conditions:  Cardiac failure   Critical care was time spent personally by me on the following activities:  Discussions with consultants, evaluation  of patient's response to treatment, examination of patient, ordering and performing treatments and interventions, ordering and review of laboratory studies, ordering and review of radiographic studies, pulse oximetry, re-evaluation of patient's condition, obtaining history from patient or surrogate and review of old charts   I assumed direction of critical care for this patient from another provider in my specialty: no     (including critical care time)  Medications Ordered in ED Medications  nitroGLYCERIN 50 mg in dextrose 5 % 250 mL (0.2 mg/mL) infusion (has no administration in time range)  iopamidol (ISOVUE-370) 76 % injection (has no administration in time range)  potassium chloride SA (K-DUR,KLOR-CON) CR tablet 40 mEq (40 mEq Oral Given 05/18/18 1707)     Initial Impression / Assessment and Plan / ED Course  I have reviewed the triage vital signs and the nursing notes.  Pertinent labs & imaging results that were available during my care of the patient were reviewed by me and considered in my medical decision making (see chart for details).  Presents to the emergency department for 2 days of left-sided chest pain with worsening exertional dyspnea.  History of CHF, recently had to reduce dose of Lasix due to issues with kidney function, history of CAD as well.  Pain worse with exertion and laying flat.  Described as a tightness that is pleuritic in nature, has also been having some productive cough with small amount of blood in the sputum.  Story has both typical and atypical features of ACS related chest pain but given pleuritic nature and sputum production would also consider PE, symptoms could also be due to CHF exacerbation.  EKG with new T wave abnormalities when compared to prior.  Lab work initiated from triage and significant for elevated troponin of 0.25.  No leukocytosis and normal hemoglobin, patient does have mild hypokalemia of 2.9, glucose is elevated at 238, no other acute  electrolyte derangements requiring intervention, creatinine of 1.46 which appears to be at the patient's baseline.  Chest x-ray with small right lower pleural effusion, but no obvious pulmonary edema or infiltrate.  Elevated troponin could be due to an STEMI versus heart failure exacerbation versus PE with right heart strain.  Case has been discussed with cardiology who will see and evaluate the patient but will also get CT angios of the chest to evaluate for PE.  Cardiology at bedside to evaluate patient and they plan to admit and perform heart cath, will start pt on nitro drip, given patient's renal function they request canceling CT angio of the chest at this time as they feel the heart cath is more pressing and do not want to worsen kidney function further with additional contrast, if cath is non-diagnostic diagnosis of PE will be revisited by admitting team.  Final Clinical Impressions(s) / ED Diagnoses   Final diagnoses:  NSTEMI (non-ST elevated myocardial infarction) [facility])  Pleuritic chest pain  Acute on chronic congestive heart failure, unspecified heart failure type ([place])  Hypokalemia  Chronic kidney disease, unspecified CKD stage    ED Discharge Orders    None       Jacqlyn Larsen, [place]  05/18/18 1859    Dorie Rank, MD 05/19/18 [phone]

## 2018-05-18 NOTE — ED Triage Notes (Signed)
PT reports a sharp stabbing pin point pain Lt chest with SHOB. And CHF.  SHOB worse with activity

## 2018-05-18 NOTE — H&P (Signed)
Cardiology Admission History and Physical:   Patient ID: Marco [name] MRN: [id number]; DOB: Jan 17, 1956   Admission date: 05/18/2018  Primary Care Provider: [name], MD Primary Cardiologist: [name], MD  Primary Electrophysiologist:  None   Chief Complaint:  Chest pain  Patient Profile:   Marco [name] is a 62 y.o. male with NICM with improved EF, CAD, combined chronic systolic and diastolic HF, istory of persistent atrial fibrillation s/p ablation in [place] in 2017, DM 2, hypertension, CKD stage III, and obstructive sleep apnea who presented with chest pain and elevated trop  History of Present Illness:   [name] is a 62 year old male was past medical history of NICM with improved EF, CAD, combined chronic systolic and diastolic heart failure, history of persistent atrial fibrillation s/p ablation in [place] in 2017, DM 2, hypertension, CKD stage III, and obstructive sleep apnea.  Although he had a EF of 45 to 50%, this was improved to normal by echocardiogram in June 2018.  Anticoagulation has been discontinued in the past due to anemia.  He also has a chronic dyspnea and also chronic chest discomfort as well.  He was seen by [name] in August 2019, in light of his shortness of breath, echocardiogram was obtained which showed EF down to 35 to 40% with inferior hypokinesis.  His ARB was stopped and he was started on hydralazine/nitrate combo.  He was seen by [name], [name] on 04/30/2018, coronary CTA with FFR was ordered to evaluate the drop in the ejection fraction.  This has not been done.  Since Sunday, he has been noticing intermittent sharp stabbing chest pain with exertion and relieved by rest.  He also complained of shortness of breath with exertion as well.  His wife has been trying to convince him to come to the hospital since Sunday, however he did not wish to.  Since early this morning, the chest pain recurred and has been persistent since.  He currently  has 5 out of 10 chest pain.  Initial vital signs stable.  BMP is mildly elevated at 412.9.  Point-of-care troponin also elevated at 0.25.  Potassium is 2.9.  Chest x-ray only revealed a small right pleural effusion without evidence of pulmonary edema.  EKG showed new T wave inversion in the lateral leads when compared to the previous EKG in January 2019.  The T wave inversion has already been present since last EKG in September 2019.   Past Medical History:  Diagnosis Date  . Benign colon polyp 08/01/2013   [facility] in [place]. large base tranverse colon polyp was biopsied. polyp was benign with minimal surface hyperplastic change.  . Chronic combined systolic and diastolic CHF (congestive heart failure) ([place])   . Chronic pain   . CKD (chronic kidney disease), stage III ([place])   . Diabetes mellitus without complication ([place])   . Diverticulosis   . Esophageal hiatal hernia 07/29/2013   confirmed on EGD   . Esophageal stricture   . Essential hypertension   . Gastritis 07/29/2013   confirmed on EGD, bx done an negative for intestinal metaplasia, dsyplasia or H. pylori. normal gastric emptying study done 07/13/2013.  [name] GERD (gastroesophageal reflux disease)   . Morbid obesity ([place])   . Non-obstructive CAD    a. 02/2013 Cath ([place]): nonobs dzs;  b. 09/2014 Myoview ([place]): EF 55%, no ischemia;  c. 12/2014 Echo: EF 50-55%, gr1 DD, no effusion.  . Persistent atrial fibrillation    a. 02/2013 s/p  rfca in [place], [place]-->prev on Xarelto, d/c'd 2/2 anemia, ? GIB.  [name] Rheumatoid arthritis ([place])   . Sigmoid diverticulosis 08/01/2013   confirmed on colonscopy. record scanned into chart    Past Surgical History:  Procedure Laterality Date  . CARDIAC CATHETERIZATION  05/2014   ablation for atrial fibrillation  . CARDIAC CATHETERIZATION N/A 11/16/2015   Procedure: Left Heart Cath and Coronary Angiography;  Surgeon: Leonie Man, MD;  Location: [facility];  Service: Cardiovascular;   Laterality: N/A;  . COLON RESECTION  09/2013   due to large, abnormal polpy. non cancerous per patient.   . COLON SURGERY  09/2014   colon resection      Medications Prior to Admission: Prior to Admission medications   Medication Sig Start Date End Date Taking? Authorizing Provider  allopurinol (ZYLOPRIM) 100 MG tablet Take 200 mg by mouth daily.  04/15/18  Yes [provider]  aspirin (EQ ASPIRIN ADULT LOW DOSE) 81 MG EC tablet Take 1 tablet (81 mg total) by mouth daily. Swallow whole. Patient taking differently: Take 81 mg by mouth daily.  02/04/18  Yes [name], MD  atorvastatin (LIPITOR) 40 MG tablet TAKE 1 TABLET BY MOUTH ONCE DAILY Patient taking differently: Take 40 mg by mouth daily.  04/14/18  Yes [name], MD  DULoxetine (CYMBALTA) 20 MG capsule Take 20 mg by mouth daily.   Yes [provider]  furosemide (LASIX) 20 MG tablet Take 1 tablet (20 mg total) by mouth as needed for edema. 05/03/18  Yes Turner, Eber Hong, MD  hydrALAZINE (APRESOLINE) 50 MG tablet Take 1 tablet (50 mg total) by mouth 3 (three) times daily. 04/29/18  Yes [name], MD  HYDROcodone-acetaminophen (NORCO) 7.5-325 MG tablet Take 1 tablet by mouth 3 (three) times daily. 03/21/18  Yes [provider]  Insulin Detemir (LEVEMIR FLEXTOUCH) 100 UNIT/ML Pen Inject 180 Units into the skin every morning. 04/14/18  Yes Renato Shin, MD  isosorbide mononitrate (IMDUR) 30 MG 24 hr tablet Take 1 tablet (30 mg total) by mouth daily. 04/21/18 04/21/19 Yes Turner, Eber Hong, MD  potassium chloride (K-DUR) 10 MEQ tablet Take 1 tablet (10 mEq total) by mouth daily. 05/03/18 05/03/19 Yes Turner, Eber Hong, MD  predniSONE (DELTASONE) 10 MG tablet Take 1 tablet (10 mg total) by mouth daily. 02/22/18  Yes [name], MD  pregabalin (LYRICA) 100 MG capsule Take 1 capsule (100 mg total) by mouth 3 (three) times daily. 02/05/17  Yes Funches, Adriana Mccallum, MD  ranitidine (ZANTAC) 150 MG tablet Take 1  tablet (150 mg total) by mouth daily before breakfast. 02/22/18  Yes [name], MD  Secukinumab (COSENTYX) 150 MG/ML SOSY Inject 150 mg into the skin every 28 (twenty-eight) days.  06/02/16  Yes [provider]  sildenafil (REVATIO) 20 MG tablet Take 20-40 mg by mouth as needed (for erectile dysfunction).    Yes [provider]  ULORIC 40 MG tablet Take 40 mg by mouth daily. 03/31/18  Yes [provider]  zolpidem (AMBIEN CR) 6.25 MG CR tablet Take 1 tablet (6.25 mg total) by mouth at bedtime as needed for sleep. Each prescription to last 1 month. Patient taking differently: Take 6.25 mg by mouth at bedtime as needed for sleep.  04/29/18  Yes [name], MD  ACCU-CHEK FASTCLIX LANCETS MISC USE AS DIRECTED TO  CHECK  BLOOD  SUGAR  3  TIMES  DAILY 04/16/18   [name], MD  albuterol (PROVENTIL HFA;VENTOLIN HFA) 108 (90 Base) MCG/ACT inhaler Inhale 2 puffs into the lungs every 6 (six) hours as needed for wheezing or shortness of breath. Patient not taking: Reported on 05/18/2018 04/29/18   [name], MD  BD PEN NEEDLE NANO U/F 32G X 4 MM MISC USE ONE  THREE TIMES DAILY 09/24/17   [name], MD  bisoprolol (ZEBETA) 5 MG tablet Take 0.5 tablets (2.5 mg total) by mouth daily. Patient not taking: Reported on 05/18/2018 04/30/18   [name] T, [name]  Blood Pressure KIT 1 each by Does not apply route 2 (two) times daily. 09/09/16   Funches, Adriana Mccallum, MD  glucose blood (ACCU-CHEK AVIVA PLUS) test strip 1 each by Other route 2 (two) times daily. And lancets 2/day 04/14/18   Renato Shin, MD  metoprolol tartrate (LOPRESSOR) 50 MG tablet Take 1 tablet (50 mg total) by mouth once for 1 dose. Patient not taking: Reported on 05/18/2018 04/30/18 04/30/18  [name] T, [name]     Allergies:   No Known Allergies  Social History:   Social History   Socioeconomic History  . Marital status: Married    Spouse name: Amethyst  . Number of children: 8  .  Years of education: 27  . Highest education level: Not on file  Occupational History    Comment: disabled  Social Needs  . Financial resource strain: Not on file  . Food insecurity:    Worry: Not on file    Inability: Not on file  . Transportation needs:    Medical: Not on file    Non-medical: Not on file  Tobacco Use  . Smoking status: Former Smoker    Packs/day: 0.50    Years: 10.00    Pack years: 5.00    Types: Cigarettes    Last attempt to quit: 04/11/2008    Years since quitting: 10.1  . Smokeless tobacco: Never Used  Substance and Sexual Activity  . Alcohol use: No    Alcohol/week: 0.0 standard drinks  . Drug use: No  . Sexual activity: Yes    Partners: Female    Birth control/protection: None  Lifestyle  . Physical activity:    Days per week: Not on file    Minutes per session: Not on file  . Stress: Not on file  Relationships  . Social connections:    Talks on phone: Not on file    Gets together: Not on file    Attends religious service: Not on file    Active member of club or organization: Not on file    Attends meetings of clubs or organizations: Not on file    Relationship status: Not on file  . Intimate partner violence:    Fear of current or ex partner: Not on file    Emotionally abused: Not on file    Physically abused: Not on file    Forced sexual activity: Not on file  Other Topics Concern  . Not on file  Social History Narrative   Lives with wife. Does not work.  On disability.    Caffeine- coffee, 1/2 cup daily    Family History:   The patient's family history includes COPD in his father and mother; Diabetes in his father and mother; Heart Problems in his father; Hypertension in his father and mother; Lung cancer in his mother. There is no history of Colon cancer or Stomach cancer.    ROS:  Please see the history of present illness.  All  other ROS reviewed and negative.     Physical Exam/Data:   Vitals:   05/18/18 1438 05/18/18 1630  05/18/18 1700  BP:  (!) 148/97 (!) 141/94  Pulse:  77 85  Resp:  (!) 9 16  SpO2:  93% 96%  Weight: 99.8 kg     No intake or output data in the 24 hours ending 05/18/18 1827 Filed Weights   05/18/18 1438  Weight: 99.8 kg   Body mass index is 35.51 kg/m.  General:  Well nourished, well developed, in no acute distress HEENT: normal Lymph: no adenopathy Neck: no JVD Endocrine:  No thryomegaly Vascular: No carotid bruits; FA pulses 2+ bilaterally without bruits  Cardiac:  normal S1, S2; RRR; no murmur  Lungs:  clear to auscultation bilaterally, no wheezing, rhonchi or rales  Abd: soft, nontender, no hepatomegaly  Ext: no edema Musculoskeletal:  No deformities, BUE and BLE strength normal and equal Skin: warm and dry  Neuro:  CNs 2-12 intact, no focal abnormalities noted Psych:  Normal affect    EKG:  The ECG that was done in ED was personally reviewed and demonstrates sinus brady with TWI in lateral leads  Relevant CV Studies:  Echo 04/21/2018 LV EF: 35% -   40% Study Conclusions  - Left ventricle: Wall thickness was increased in a pattern of   severe LVH. There was focal basal hypertrophy. Systolic function   was moderately reduced. The estimated ejection fraction was in   the range of 35% to 40%. Severe hypokinesis of the inferior   myocardium. Doppler parameters are consistent with abnormal left   ventricular relaxation (grade 1 diastolic dysfunction).  Laboratory Data:  Chemistry Recent Labs  Lab 05/18/18 1443  NA 141  K 2.9*  CL 106  CO2 25  GLUCOSE 238*  BUN 17  CREATININE 1.46*  CALCIUM 8.5*  GFRNONAA 49*  GFRAA 57*  ANIONGAP 10    No results for input(s): PROT, ALBUMIN, AST, ALT, ALKPHOS, BILITOT in the last 168 hours. Hematology Recent Labs  Lab 05/18/18 1443  WBC 9.1  RBC 4.60  HGB 13.1  HCT 43.9  MCV 95.4  MCH 28.5  MCHC 29.8*  RDW 15.1  PLT 224   Cardiac EnzymesNo results for input(s): TROPONINI in the last 168 hours.  Recent Labs    Lab 05/18/18 1502  TROPIPOC 0.25*    BNP Recent Labs  Lab 05/18/18 1443  BNP 412.9*    DDimer No results for input(s): DDIMER in the last 168 hours.  Radiology/Studies:  Dg Chest 2 View  Result Date: 05/18/2018 CLINICAL DATA:  Chest pain EXAM: CHEST - 2 VIEW COMPARISON:  04/29/2018 FINDINGS: Heart size upper normal. Negative for heart failure. No focal consolidation. Small right effusion has developed in the interval. IMPRESSION: Small right pleural effusion without evidence of pulmonary edema. Electronically Signed   By: Franchot Gallo M.D.   On: 05/18/2018 15:20    Assessment and Plan:   1. NSTEMI: He has recent exertional chest pain and the dyspnea on exertion is concerning for ACS.  He has been having persistent chest pain since earlier this morning, troponin mildly elevated.  We will placed him on nitro drip.  I would recommend cardiac catheterization for definitive evaluation.  We will hold off on CT angiogram of the chest for now, may consider later if cardiac catheterization is negative. -Repeat echo.  Serial troponin overnight. -Risk and benefit of procedure explained to the patient who display clear understanding and agree to proceed.  Discussed with patient possible procedural risk include bleeding, vascular injury, renal injury, arrythmia, MI, stroke and loss of limb or life.  -Cancel coronary CT as outpatient.  2. CAD: Minimal amount of CAD in LAD on last cardiac catheterization 2017  3. History of chronic combined systolic and diastolic heart failure: EF later improved.  Does not appears to be significantly volume overloaded.  Chest x-ray did show a very small pleural effusion  4. Hypokalemia: Potassium 2.9, repleted in the ED.  5. history of persistent atrial fibrillation s/p ablation in [place] in 2017: Not on systemic anticoagulation due to history of bleeding  6. DM 2  7. Hypertension  8. CKD stage III: Hydrate overnight, pending cath  tomorrow  9. obstructive sleep apnea  Severity of Illness: The appropriate patient status for this patient is OBSERVATION. Observation status is judged to be reasonable and necessary in order to provide the required intensity of service to ensure the patient's safety. The patient's presenting symptoms, physical exam findings, and initial radiographic and laboratory data in the context of their medical condition is felt to place them at decreased risk for further clinical deterioration. Furthermore, it is anticipated that the patient will be medically stable for discharge from the hospital within 2 midnights of admission. The following factors support the patient status of observation.   " The patient's presenting symptoms include chest pain. " The physical exam findings include benign. " The initial radiographic and laboratory data are elevated troponin.     For questions or updates, please contact [place] [facility] Please consult [email or web address] for contact info under        Hilbert Corrigan, [place]  05/18/2018 6:27 PM

## 2018-05-19 ENCOUNTER — Encounter (HOSPITAL_COMMUNITY): Payer: Self-pay | Admitting: Cardiovascular Disease

## 2018-05-19 ENCOUNTER — Observation Stay (HOSPITAL_COMMUNITY): Payer: Medicaid Other

## 2018-05-19 ENCOUNTER — Encounter (HOSPITAL_COMMUNITY)
Admission: EM | Disposition: A | Discharge status: Home / Self Care | Payer: Self-pay | Source: Home / Self Care | Attending: Cardiology

## 2018-05-19 DIAGNOSIS — I5030 Unspecified diastolic (congestive) heart failure: Secondary | ICD-10-CM

## 2018-05-19 DIAGNOSIS — Z8249 Family history of ischemic heart disease and other diseases of the circulatory system: Secondary | ICD-10-CM | POA: Diagnosis not present

## 2018-05-19 DIAGNOSIS — I4581 Long QT syndrome: Secondary | ICD-10-CM | POA: Diagnosis present

## 2018-05-19 DIAGNOSIS — I480 Paroxysmal atrial fibrillation: Secondary | ICD-10-CM | POA: Diagnosis present

## 2018-05-19 DIAGNOSIS — Z87891 Personal history of nicotine dependence: Secondary | ICD-10-CM | POA: Diagnosis not present

## 2018-05-19 DIAGNOSIS — M069 Rheumatoid arthritis, unspecified: Secondary | ICD-10-CM | POA: Diagnosis present

## 2018-05-19 DIAGNOSIS — I428 Other cardiomyopathies: Secondary | ICD-10-CM | POA: Diagnosis present

## 2018-05-19 DIAGNOSIS — F419 Anxiety disorder, unspecified: Secondary | ICD-10-CM | POA: Diagnosis present

## 2018-05-19 DIAGNOSIS — R000 Tachycardia, unspecified: Secondary | ICD-10-CM | POA: Diagnosis not present

## 2018-05-19 DIAGNOSIS — E1122 Type 2 diabetes mellitus with diabetic chronic kidney disease: Secondary | ICD-10-CM | POA: Diagnosis present

## 2018-05-19 DIAGNOSIS — I509 Heart failure, unspecified: Secondary | ICD-10-CM

## 2018-05-19 DIAGNOSIS — I248 Other forms of acute ischemic heart disease: Secondary | ICD-10-CM | POA: Diagnosis not present

## 2018-05-19 DIAGNOSIS — G4733 Obstructive sleep apnea (adult) (pediatric): Secondary | ICD-10-CM | POA: Diagnosis present

## 2018-05-19 DIAGNOSIS — Z7982 Long term (current) use of aspirin: Secondary | ICD-10-CM | POA: Diagnosis not present

## 2018-05-19 DIAGNOSIS — Z79899 Other long term (current) drug therapy: Secondary | ICD-10-CM | POA: Diagnosis not present

## 2018-05-19 DIAGNOSIS — R0789 Other chest pain: Secondary | ICD-10-CM | POA: Diagnosis not present

## 2018-05-19 DIAGNOSIS — I5043 Acute on chronic combined systolic (congestive) and diastolic (congestive) heart failure: Secondary | ICD-10-CM | POA: Diagnosis present

## 2018-05-19 DIAGNOSIS — N183 Chronic kidney disease, stage 3 (moderate): Secondary | ICD-10-CM | POA: Diagnosis present

## 2018-05-19 DIAGNOSIS — E876 Hypokalemia: Secondary | ICD-10-CM | POA: Diagnosis present

## 2018-05-19 DIAGNOSIS — E785 Hyperlipidemia, unspecified: Secondary | ICD-10-CM | POA: Diagnosis present

## 2018-05-19 DIAGNOSIS — I10 Essential (primary) hypertension: Secondary | ICD-10-CM | POA: Diagnosis not present

## 2018-05-19 DIAGNOSIS — I5023 Acute on chronic systolic (congestive) heart failure: Secondary | ICD-10-CM | POA: Diagnosis not present

## 2018-05-19 DIAGNOSIS — I25110 Atherosclerotic heart disease of native coronary artery with unstable angina pectoris: Principal | ICD-10-CM

## 2018-05-19 DIAGNOSIS — Z794 Long term (current) use of insulin: Secondary | ICD-10-CM | POA: Diagnosis not present

## 2018-05-19 DIAGNOSIS — Z7952 Long term (current) use of systemic steroids: Secondary | ICD-10-CM | POA: Diagnosis not present

## 2018-05-19 DIAGNOSIS — K219 Gastro-esophageal reflux disease without esophagitis: Secondary | ICD-10-CM | POA: Diagnosis present

## 2018-05-19 DIAGNOSIS — M109 Gout, unspecified: Secondary | ICD-10-CM | POA: Diagnosis present

## 2018-05-19 DIAGNOSIS — R110 Nausea: Secondary | ICD-10-CM | POA: Diagnosis not present

## 2018-05-19 DIAGNOSIS — I130 Hypertensive heart and chronic kidney disease with heart failure and stage 1 through stage 4 chronic kidney disease, or unspecified chronic kidney disease: Secondary | ICD-10-CM | POA: Diagnosis present

## 2018-05-19 HISTORY — PX: LEFT HEART CATH AND CORONARY ANGIOGRAPHY: CATH118249

## 2018-05-19 LAB — CBC
HEMATOCRIT: 42 % (ref 39.0–52.0)
HEMOGLOBIN: 12.8 g/dL — AB (ref 13.0–17.0)
MCH: 28.5 pg (ref 26.0–34.0)
MCHC: 30.5 g/dL (ref 30.0–36.0)
MCV: 93.5 fL (ref 80.0–100.0)
NRBC: 0 % (ref 0.0–0.2)
Platelets: 237 10*3/uL (ref 150–400)
RBC: 4.49 MIL/uL (ref 4.22–5.81)
RDW: 15 % (ref 11.5–15.5)
WBC: 10.2 10*3/uL (ref 4.0–10.5)

## 2018-05-19 LAB — TROPONIN I
Troponin I: 0.19 ng/mL (ref <0.03)
Troponin I: 0.17 ng/mL (ref <0.03)

## 2018-05-19 LAB — POCT I-STAT, CHEM 8
BUN: 15 mg/dL (ref 8–23)
CHLORIDE: 107 mmol/L (ref 98–111)
CREATININE: 1.4 mg/dL — AB (ref 0.61–1.24)
Calcium, Ion: 1.11 mmol/L — ABNORMAL LOW (ref 1.15–1.40)
GLUCOSE: 163 mg/dL — AB (ref 70–99)
HEMATOCRIT: 45 % (ref 39.0–52.0)
Hemoglobin: 15.3 g/dL (ref 13.0–17.0)
Potassium: 3.3 mmol/L — ABNORMAL LOW (ref 3.5–5.1)
Sodium: 145 mmol/L (ref 135–145)
TCO2: 25 mmol/L (ref 22–32)

## 2018-05-19 LAB — HEPARIN LEVEL (UNFRACTIONATED): Heparin Unfractionated: 0.45 IU/mL (ref 0.30–0.70)

## 2018-05-19 LAB — BASIC METABOLIC PANEL
ANION GAP: 10 (ref 5–15)
Anion gap: 12 (ref 5–15)
BUN: 17 mg/dL (ref 8–23)
BUN: 14 mg/dL (ref 8–23)
CO2: 26 mmol/L (ref 22–32)
CO2: 21 mmol/L — AB (ref 22–32)
Calcium: 8.5 mg/dL — ABNORMAL LOW (ref 8.9–10.3)
Calcium: 8.8 mg/dL — ABNORMAL LOW (ref 8.9–10.3)
Chloride: 108 mmol/L (ref 98–111)
Chloride: 110 mmol/L (ref 98–111)
Creatinine, Ser: 1.36 mg/dL — ABNORMAL HIGH (ref 0.61–1.24)
Creatinine, Ser: 1.4 mg/dL — ABNORMAL HIGH (ref 0.61–1.24)
GFR calc Af Amer: >60 mL/min (ref >60)
GFR, EST NON AFRICAN AMERICAN: 54 mL/min — AB (ref >60)
GFR, EST NON AFRICAN AMERICAN: 52 mL/min — AB (ref >60)
GLUCOSE: 161 mg/dL — AB (ref 70–99)
Glucose, Bld: 80 mg/dL (ref 70–99)
POTASSIUM: 3.4 mmol/L — AB (ref 3.5–5.1)
POTASSIUM: 3.4 mmol/L — AB (ref 3.5–5.1)
SODIUM: 144 mmol/L (ref 135–145)
Sodium: 143 mmol/L (ref 135–145)

## 2018-05-19 LAB — POCT I-STAT 3, ART BLOOD GAS (G3+)
Acid-base deficit: 1 mmol/L (ref 0.0–2.0)
BICARBONATE: 24.3 mmol/L (ref 20.0–28.0)
O2 Saturation: 95 %
PO2 ART: 79 mmHg — AB (ref 83.0–108.0)
Patient temperature: 99.1
TCO2: 26 mmol/L (ref 22–32)
pCO2 arterial: 41.5 mmHg (ref 32.0–48.0)
pH, Arterial: 7.377 (ref 7.350–7.450)

## 2018-05-19 LAB — MRSA PCR SCREENING: MRSA BY PCR: NEGATIVE

## 2018-05-19 LAB — MAGNESIUM: Magnesium: 2 mg/dL (ref 1.7–2.4)

## 2018-05-19 LAB — ECHOCARDIOGRAM COMPLETE
Height: 66 in
Weight: 3510.4 oz

## 2018-05-19 SURGERY — LEFT HEART CATH AND CORONARY ANGIOGRAPHY
Anesthesia: LOCAL

## 2018-05-19 MED ORDER — SODIUM CHLORIDE 0.9% FLUSH
3.0000 mL | Freq: Two times a day (BID) | INTRAVENOUS | Status: DC
  Administered 2018-05-19 – 2018-05-22 (×6): 3 mL via INTRAVENOUS

## 2018-05-19 MED ORDER — FENTANYL CITRATE (PF) 100 MCG/2ML IJ SOLN
INTRAMUSCULAR | Status: DC | PRN
  Administered 2018-05-19: 50 ug via INTRAVENOUS

## 2018-05-19 MED ORDER — CARVEDILOL 3.125 MG PO TABS
3.1250 mg | ORAL_TABLET | Freq: Two times a day (BID) | ORAL | Status: DC
  Administered 2018-05-19 – 2018-05-22 (×6): 3.125 mg via ORAL
  Filled 2018-05-19 (×6): qty 1

## 2018-05-19 MED ORDER — LIDOCAINE HCL (PF) 1 % IJ SOLN
INTRAMUSCULAR | Status: AC
  Filled 2018-05-19: qty 30

## 2018-05-19 MED ORDER — FUROSEMIDE 10 MG/ML IJ SOLN
40.0000 mg | Freq: Two times a day (BID) | INTRAMUSCULAR | Status: DC
  Filled 2018-05-19: qty 4

## 2018-05-19 MED ORDER — NITROGLYCERIN IN D5W 200-5 MCG/ML-% IV SOLN
0.0000 ug/min | INTRAVENOUS | Status: DC
  Administered 2018-05-19: 50 ug/min via INTRAVENOUS

## 2018-05-19 MED ORDER — PROMETHAZINE HCL 25 MG/ML IJ SOLN: 6.2500 mg | Freq: Once | INTRAMUSCULAR | Status: DC

## 2018-05-19 MED ORDER — IOHEXOL 350 MG/ML SOLN
INTRAVENOUS | Status: DC | PRN
  Administered 2018-05-19: 60 mL via INTRACARDIAC

## 2018-05-19 MED ORDER — FUROSEMIDE 10 MG/ML IJ SOLN
120.0000 mg | Freq: Two times a day (BID) | INTRAVENOUS | Status: DC
  Administered 2018-05-19 – 2018-05-21 (×4): 120 mg via INTRAVENOUS
  Filled 2018-05-19: qty 10
  Filled 2018-05-19: qty 12
  Filled 2018-05-19 (×2): qty 10

## 2018-05-19 MED ORDER — MIDAZOLAM HCL 2 MG/2ML IJ SOLN
INTRAMUSCULAR | Status: DC | PRN
  Administered 2018-05-19: 1 mg via INTRAVENOUS

## 2018-05-19 MED ORDER — FUROSEMIDE 10 MG/ML IJ SOLN
80.0000 mg | Freq: Once | INTRAMUSCULAR | Status: AC
  Administered 2018-05-19: 80 mg via INTRAVENOUS

## 2018-05-19 MED ORDER — FUROSEMIDE 10 MG/ML IJ SOLN
INTRAMUSCULAR | Status: AC
  Filled 2018-05-19: qty 8

## 2018-05-19 MED ORDER — POTASSIUM CHLORIDE CRYS ER 20 MEQ PO TBCR
60.0000 meq | EXTENDED_RELEASE_TABLET | Freq: Once | ORAL | Status: AC
  Administered 2018-05-19: 60 meq via ORAL

## 2018-05-19 MED ORDER — LORAZEPAM 2 MG/ML IJ SOLN
0.5000 mg | Freq: Once | INTRAMUSCULAR | Status: AC
  Administered 2018-05-19: 0.5 mg via INTRAVENOUS

## 2018-05-19 MED ORDER — MIDAZOLAM HCL 2 MG/2ML IJ SOLN
INTRAMUSCULAR | Status: AC
  Filled 2018-05-19: qty 2

## 2018-05-19 MED ORDER — SODIUM CHLORIDE 0.9% FLUSH: 3.0000 mL | INTRAVENOUS | Status: DC | PRN

## 2018-05-19 MED ORDER — VERAPAMIL HCL 2.5 MG/ML IV SOLN
INTRAVENOUS | Status: DC | PRN
  Administered 2018-05-19: 11:00:00 via INTRA_ARTERIAL

## 2018-05-19 MED ORDER — VERAPAMIL HCL 2.5 MG/ML IV SOLN
INTRAVENOUS | Status: AC
  Filled 2018-05-19: qty 2

## 2018-05-19 MED ORDER — ENOXAPARIN SODIUM 40 MG/0.4ML ~~LOC~~ SOLN
40.0000 mg | SUBCUTANEOUS | Status: DC
  Administered 2018-05-20 – 2018-05-22 (×3): 40 mg via SUBCUTANEOUS
  Filled 2018-05-19 (×3): qty 0.4

## 2018-05-19 MED ORDER — LIDOCAINE HCL (PF) 1 % IJ SOLN
INTRAMUSCULAR | Status: DC | PRN
  Administered 2018-05-19: 2 mL

## 2018-05-19 MED ORDER — HEPARIN (PORCINE) IN NACL 1000-0.9 UT/500ML-% IV SOLN
INTRAVENOUS | Status: AC
  Filled 2018-05-19: qty 1000

## 2018-05-19 MED ORDER — HEPARIN SODIUM (PORCINE) 1000 UNIT/ML IJ SOLN
INTRAMUSCULAR | Status: DC | PRN
  Administered 2018-05-19: 5000 [IU] via INTRAVENOUS

## 2018-05-19 MED ORDER — HEPARIN (PORCINE) IN NACL 1000-0.9 UT/500ML-% IV SOLN
INTRAVENOUS | Status: DC | PRN
  Administered 2018-05-19 (×2): 500 mL

## 2018-05-19 MED ORDER — FENTANYL CITRATE (PF) 100 MCG/2ML IJ SOLN
INTRAMUSCULAR | Status: AC
  Filled 2018-05-19: qty 2

## 2018-05-19 MED ORDER — SODIUM CHLORIDE 0.9 % IV SOLN: 250.0000 mL | INTRAVENOUS | Status: DC | PRN

## 2018-05-19 MED ORDER — SACUBITRIL-VALSARTAN 24-26 MG PO TABS
1.0000 | ORAL_TABLET | Freq: Two times a day (BID) | ORAL | Status: DC
  Administered 2018-05-19 – 2018-05-22 (×6): 1 via ORAL
  Filled 2018-05-19 (×8): qty 1

## 2018-05-19 MED ORDER — POTASSIUM CHLORIDE CRYS ER 20 MEQ PO TBCR
40.0000 meq | EXTENDED_RELEASE_TABLET | Freq: Two times a day (BID) | ORAL | Status: DC
  Administered 2018-05-19 – 2018-05-22 (×7): 40 meq via ORAL
  Filled 2018-05-19 (×7): qty 2

## 2018-05-19 MED ORDER — CHLORHEXIDINE GLUCONATE 0.12 % MT SOLN
15.0000 mL | Freq: Two times a day (BID) | OROMUCOSAL | Status: DC
  Administered 2018-05-20 – 2018-05-22 (×5): 15 mL via OROMUCOSAL
  Filled 2018-05-19 (×4): qty 15

## 2018-05-19 MED ORDER — ORAL CARE MOUTH RINSE
15.0000 mL | Freq: Two times a day (BID) | OROMUCOSAL | Status: DC
  Administered 2018-05-19 – 2018-05-21 (×3): 15 mL via OROMUCOSAL

## 2018-05-19 MED ORDER — LORAZEPAM 2 MG/ML IJ SOLN
INTRAMUSCULAR | Status: AC
  Filled 2018-05-19: qty 1

## 2018-05-19 SURGICAL SUPPLY — 10 items
CATH INFINITI 5FR ANG PIGTAIL (CATHETERS) ×2 IMPLANT
CATH INFINITI 5FR JK (CATHETERS) ×2 IMPLANT
DEVICE RAD COMP TR BAND LRG (VASCULAR PRODUCTS) ×2 IMPLANT
GLIDESHEATH SLEND SS 6F .021 (SHEATH) ×2 IMPLANT
GUIDEWIRE INQWIRE 1.5J.035X260 (WIRE) ×1 IMPLANT
INQWIRE 1.5J .035X260CM (WIRE) ×2
KIT HEART LEFT (KITS) ×2 IMPLANT
PACK CARDIAC CATHETERIZATION (CUSTOM PROCEDURE TRAY) ×2 IMPLANT
TRANSDUCER W/STOPCOCK (MISCELLANEOUS) ×2 IMPLANT
TUBING CIL FLEX 10 FLL-RA (TUBING) ×2 IMPLANT

## 2018-05-19 NOTE — Progress Notes (Signed)
Inpatient Diabetes Program Recommendations  AACE/ADA: New Consensus Statement on Inpatient Glycemic Control (2015)  Target Ranges:  Prepandial:   less than 140 mg/dL      Peak postprandial:   less than 180 mg/dL (1-2 hours)      Critically ill patients:  140 - 180 mg/dL   Review of Glycemic Control  Diabetes history: DM 2 Outpatient Diabetes medications: Levemir 180 units qam Current orders for Inpatient glycemic control: None  A1c 8.8% on 9/4  Inpatient Diabetes Program Recommendations:    Patient with Hx of DM. Glucose trends elevated. Consider checking CBGS ACHS and starting Novolog 0-15 units tid and Novolog 0-5 units qhs, while here. Patient may need low dose long acting insulin ordered if glucose trends increase.  Thanks,  Tama Headings RN, MSN, BC-ADM Inpatient Diabetes Coordinator Team Pager [phone] (8a-5p)

## 2018-05-19 NOTE — Procedures (Signed)
Echo attempted. Patient nauseous and having difficulty breathing. Nurse contacted. Will attempt later.

## 2018-05-19 NOTE — Progress Notes (Signed)
Patient's respiratory status worsened acutely. He was severely hypertensive. Coughing up pink frothy sputum. Sinus tachycardia with HR 120. Sats 92%. Severe respiratory distress.   Given total 160 mg IV lasix. Started on IV Ntg titrated for BP. Started on Bipap. CXR shows pulmonary edema.   Will transfer to ICU for more intensive therapy. IV diuresis.  Check ABG. Follow I/O and weight closely.   Peter Martinique MD, [facility]

## 2018-05-19 NOTE — Progress Notes (Signed)
Spoke with pt's wife Marco [name] about pt condition and transfer to [facility]. Carroll Kinds RN

## 2018-05-19 NOTE — Progress Notes (Signed)
ANTICOAGULATION CONSULT NOTE   Pharmacy Consult for Heparin  Indication: chest pain/ACS  No Known Allergies  Patient Measurements: Height: 5\' 6"  (167.6 cm) Weight: 219 lb 6.4 oz (99.5 kg) IBW/kg (Calculated) : 63.8 Heparin Dosing Weight: 88kg  Vital Signs: Temp: 98.1 F (36.7 C) (10/08 2100) Temp Source: Oral (10/08 2100) BP: 144/91 (10/08 2212) Pulse Rate: 91 (10/08 2212)  Labs: Recent Labs    05/18/18 1443 05/18/18 2105 05/19/18 0330  HGB 13.1  --  12.8*  HCT 43.9  --  42.0  PLT 224  --  237  HEPARINUNFRC  --   --  0.45  CREATININE 1.46*  --   --   TROPONINI  --  0.16*  --     Estimated Creatinine Clearance: 57.2 mL/min (A) (by C-G formula based on SCr of 1.46 mg/dL (H)).   Medical History: Past Medical History:  Diagnosis Date  . Benign colon polyp 08/01/2013   [facility] in [place]. large base tranverse colon polyp was biopsied. polyp was benign with minimal surface hyperplastic change.  . Chronic combined systolic and diastolic CHF (congestive heart failure) ([place])   . Chronic pain   . CKD (chronic kidney disease), stage III ([place])   . Diabetes mellitus without complication ([place])   . Diverticulosis   . Esophageal hiatal hernia 07/29/2013   confirmed on EGD   . Esophageal stricture   . Essential hypertension   . Gastritis 07/29/2013   confirmed on EGD, bx done an negative for intestinal metaplasia, dsyplasia or H. pylori. normal gastric emptying study done 07/13/2013.  [name] GERD (gastroesophageal reflux disease)   . Morbid obesity ([place])   . Non-obstructive CAD    a. 02/2013 Cath ([place]): nonobs dzs;  b. 09/2014 Myoview ([place]): EF 55%, no ischemia;  c. 12/2014 Echo: EF 50-55%, gr1 DD, no effusion.  . Persistent atrial fibrillation    a. 02/2013 s/p rfca in [place], [place]-->prev on Xarelto, d/c'd 2/2 anemia, ? GIB.  [name] Rheumatoid arthritis ([place])   . Sigmoid diverticulosis 08/01/2013   confirmed on colonscopy. record scanned into chart   Assessment: 62  yo BM with history of nonischemic CM, nonobstructive CAD, DM, HTN presents with progressive chest pain. Pharmacy asked to start IV heparin for nstemi. CBC within normal limits, no anticoagulation prior to admit.  10/9 AM update: initial heparin level therapeutic this AM, plan for cath, CBC stable   Goal of Therapy:  Heparin level 0.3-0.7 units/ml Monitor platelets by anticoagulation protocol: Yes   Plan:  Cont heparin at 1200 units/hr 1100 heparin level   Narda Bonds, PharmD, BCPS Clinical Pharmacist Phone: [phone]

## 2018-05-19 NOTE — Interval H&P Note (Signed)
Cath Lab Visit (complete for each Cath Lab visit)  Clinical Evaluation Leading to the Procedure:   ACS: Yes.    Non-ACS:  n/a       History and Physical Interval Note:  05/19/2018 10:33 AM  Marco [name]  has presented today for surgery, with the diagnosis of NSTEMI  The various methods of treatment have been discussed with the patient and family. After consideration of risks, benefits and other options for treatment, the patient has consented to  Procedure(s): LEFT HEART CATH AND CORONARY ANGIOGRAPHY (N/A) as a surgical intervention .  The patient's history has been reviewed, patient examined, no change in status, stable for surgery.  I have reviewed the patient's chart and labs.  Questions were answered to the patient's satisfaction.     Kathlyn [place]

## 2018-05-19 NOTE — Progress Notes (Signed)
Transporting patient to [place] on BIPAP.

## 2018-05-19 NOTE — Progress Notes (Signed)
Placed patient on BIPAP at charted setting per MD orders waiting transport t 2 heart.

## 2018-05-19 NOTE — Progress Notes (Signed)
  Echocardiogram 2D Echocardiogram has been performed.  Marco [name] 05/19/2018, 4:19 PM

## 2018-05-19 NOTE — Progress Notes (Signed)
Dr. Martinique notified nurse patient c/o SOB, pt resp labored and sats 90%on 2L with increased work to breath. BP elevated and pt c/o feeling like he "cannot breath". Given 80mg  IV lasix per Dr. Martinique 1330hrs, and NTG gtt started at 67mcg/min.  BP remained elevated and NTG gtt titrated up at 27mcg/min.  Given 0.5mg  ativan IV at 1340hrs and second dose of 80mg  lasix IV at 1345hrs.  Pt Voided 300cc.  Pt placed on NRBM and BiPap placed on patient.  Order to transfer to ICU.  Report called to [facility].  Pt wife called and updated by Murvin Natal RN.  Pt transferred with RT present  on BiPAP.  Dr. Martinique at bedside.

## 2018-05-19 NOTE — Progress Notes (Signed)
Progress Note  Patient Name: Marco [name] Date of Encounter: 05/19/2018  Primary Cardiologist: [name], MD   Subjective   Seen 2 hours post cath. Patient reports he cannot breath. Appears in respiratory distress. Having to sit up. No chest pain  Inpatient Medications    Scheduled Meds: . [START ON 05/20/2018] aspirin EC  81 mg Oral Daily  . atorvastatin  40 mg Oral Q2200  . carvedilol  3.125 mg Oral BID WC  . DULoxetine  20 mg Oral Daily  . [START ON 05/20/2018] enoxaparin (LOVENOX) injection  40 mg Subcutaneous Q24H  . febuxostat  40 mg Oral Daily  . furosemide  40 mg Intravenous BID  . furosemide  80 mg Intravenous Once  . HYDROcodone-acetaminophen  1 tablet Oral TID  . promethazine  6.25 mg Intravenous Once  . sacubitril-valsartan  1 tablet Oral BID  . sodium chloride flush  3 mL Intravenous Q12H   Continuous Infusions: . sodium chloride     PRN Meds: sodium chloride, acetaminophen, albuterol, nitroGLYCERIN, ondansetron (ZOFRAN) IV, sodium chloride flush, zolpidem   Vital Signs    Vitals:   05/19/18 1038 05/19/18 1043 05/19/18 1047 05/19/18 1052  BP: (!) 165/101 (!) 138/97 (!) 145/97 (!) 144/96  Pulse: (!) 103 95 (!) 102 98  Resp: 15 (!) 9 13 (!) 9  Temp:      TempSrc:      SpO2: 91% 92% 90% 93%  Weight:      Height:        Intake/Output Summary (Last 24 hours) at 05/19/2018 1313 Last data filed at 05/19/2018 0800 Gross per 24 hour  Intake 493.66 ml  Output 150 ml  Net 343.66 ml   Filed Weights   05/18/18 1438 05/18/18 2100  Weight: 99.8 kg 99.5 kg    Telemetry    Sinus tachy rate 102 - Personally Reviewed  ECG    Sinus tachy nonspecific TWA. - Personally Reviewed  Physical Exam   GEN: obese BM in moderate respiratory distress.  Neck: No JVD Cardiac: RRR, no murmurs, rubs, or gallops.  Respiratory: bilateral wheezes/rales. GI: Soft, nontender, non-distended  MS: No edema; No deformity. Neuro:  Nonfocal  Psych: Normal affect    Labs    Chemistry Recent Labs  Lab 05/18/18 1443 05/19/18 0330  NA 141 144  K 2.9* 3.4*  CL 106 108  CO2 25 26  GLUCOSE 238* 80  BUN 17 17  CREATININE 1.46* 1.36*  CALCIUM 8.5* 8.5*  GFRNONAA 49* 54*  GFRAA 57* >60  ANIONGAP 10 10     Hematology Recent Labs  Lab 05/18/18 1443 05/19/18 0330  WBC 9.1 10.2  RBC 4.60 4.49  HGB 13.1 12.8*  HCT 43.9 42.0  MCV 95.4 93.5  MCH 28.5 28.5  MCHC 29.8* 30.5  RDW 15.1 15.0  PLT 224 237    Cardiac Enzymes Recent Labs  Lab 05/18/18 2105 05/19/18 0330 05/19/18 0718  TROPONINI 0.16* 0.19* 0.17*    Recent Labs  Lab 05/18/18 1502  TROPIPOC 0.25*     BNP Recent Labs  Lab 05/18/18 1443  BNP 412.9*     DDimer No results for input(s): DDIMER in the last 168 hours.   Radiology    Dg Chest 2 View  Result Date: 05/18/2018 CLINICAL DATA:  Chest pain EXAM: CHEST - 2 VIEW COMPARISON:  04/29/2018 FINDINGS: Heart size upper normal. Negative for heart failure. No focal consolidation. Small right effusion has developed in the interval. IMPRESSION: Small right pleural effusion without  evidence of pulmonary edema. Electronically Signed   By: Franchot Gallo M.D.   On: 05/18/2018 15:20    Cardiac Studies   Echo 04/21/18: Study Conclusions  - Left ventricle: Wall thickness was increased in a pattern of   severe LVH. There was focal basal hypertrophy. Systolic function   was moderately reduced. The estimated ejection fraction was in   the range of 35% to 40%. Severe hypokinesis of the inferior   myocardium. Doppler parameters are consistent with abnormal left   ventricular relaxation (grade 1 diastolic dysfunction).  LEFT HEART CATH AND CORONARY ANGIOGRAPHY  Conclusion     Ost 2nd Diag lesion is 35% stenosed.  Prox LAD to Mid LAD lesion is 20% stenosed.  Ost Cx to Prox Cx lesion is 10% stenosed.  There is severe left ventricular systolic dysfunction.  LV end diastolic pressure is moderately elevated.  The left  ventricular ejection fraction is 25-35% by visual estimate.   1.  Mild nonobstructive coronary artery disease. 2.  Severely reduced LV systolic function with an EF of 25 to 30% with severe global hypokinesis. 3.  Moderately to severely elevated left ventricular end-diastolic pressure at 28 mmHg.  Recommendations: The patient has nonischemic cardiomyopathy and he is significantly volume overloaded.  I recommend diuresis with furosemide 40 mg intravenously twice daily. I discontinued hydralazine and added a small dose carvedilol and Entresto.     Patient Profile     62 y.o. male with history of nonischemic CM, nonobstructive CAD, DM, HTN presents with progressive chest pain and dyspnea. Elevated troponin  Assessment & Plan    1. Acute on chronic systolic CHF. Appears in respiratory distress. EF 25-30% by cath with elevated filling pressures. Will give IV lasix 80 mg now then 40 mg IV bid. Started on [place] and low dose Coreg. Need to follow  Renal function closely. Will start IV Ntg for BP. Monitor strict I/O and daily weight.  2. Nonobstructive CAD. Risk factor modification. 3. HTN. As noted above.  4. Hypokalemia. With IV lasix needs to be repleted 5. CKD stage 3.  6. OSA 7. Remote history of afib s/p ablation without recurrence. 8. DM type 2    For questions or updates, please contact [place] Please consult [email or web address] for contact info under        Signed, Ticia Virgo [place], MD  05/19/2018, 1:13 PM

## 2018-05-20 ENCOUNTER — Other Ambulatory Visit: Payer: Self-pay

## 2018-05-20 DIAGNOSIS — I248 Other forms of acute ischemic heart disease: Secondary | ICD-10-CM

## 2018-05-20 DIAGNOSIS — I5043 Acute on chronic combined systolic (congestive) and diastolic (congestive) heart failure: Secondary | ICD-10-CM

## 2018-05-20 LAB — BASIC METABOLIC PANEL
ANION GAP: 11 (ref 5–15)
BUN: 14 mg/dL (ref 8–23)
CO2: 28 mmol/L (ref 22–32)
Calcium: 9 mg/dL (ref 8.9–10.3)
Chloride: 105 mmol/L (ref 98–111)
Creatinine, Ser: 1.59 mg/dL — ABNORMAL HIGH (ref 0.61–1.24)
GFR calc non Af Amer: 45 mL/min — ABNORMAL LOW (ref >60)
GFR, EST AFRICAN AMERICAN: 52 mL/min — AB (ref >60)
Glucose, Bld: 211 mg/dL — ABNORMAL HIGH (ref 70–99)
Potassium: 4.5 mmol/L (ref 3.5–5.1)
SODIUM: 144 mmol/L (ref 135–145)

## 2018-05-20 LAB — CBC
HEMATOCRIT: 43.9 % (ref 39.0–52.0)
HEMOGLOBIN: 13.8 g/dL (ref 13.0–17.0)
MCH: 29.5 pg (ref 26.0–34.0)
MCHC: 31.4 g/dL (ref 30.0–36.0)
MCV: 93.8 fL (ref 80.0–100.0)
NRBC: 0 % (ref 0.0–0.2)
Platelets: 228 10*3/uL (ref 150–400)
RBC: 4.68 MIL/uL (ref 4.22–5.81)
RDW: 15.2 % (ref 11.5–15.5)
WBC: 7.9 10*3/uL (ref 4.0–10.5)

## 2018-05-20 LAB — GLUCOSE, CAPILLARY
GLUCOSE-CAPILLARY: 272 mg/dL — AB (ref 70–99)
GLUCOSE-CAPILLARY: 221 mg/dL — AB (ref 70–99)
Glucose-Capillary: 236 mg/dL — ABNORMAL HIGH (ref 70–99)

## 2018-05-20 MED ORDER — HYDRALAZINE HCL 10 MG PO TABS
10.0000 mg | ORAL_TABLET | Freq: Three times a day (TID) | ORAL | Status: DC
  Administered 2018-05-20 – 2018-05-22 (×6): 10 mg via ORAL
  Filled 2018-05-20 (×6): qty 1

## 2018-05-20 MED ORDER — INSULIN ASPART 100 UNIT/ML ~~LOC~~ SOLN
0.0000 [IU] | Freq: Every day | SUBCUTANEOUS | Status: DC
  Administered 2018-05-20: 2 [IU] via SUBCUTANEOUS
  Administered 2018-05-21: 3 [IU] via SUBCUTANEOUS

## 2018-05-20 MED ORDER — LORAZEPAM 0.5 MG PO TABS
0.5000 mg | ORAL_TABLET | Freq: Two times a day (BID) | ORAL | Status: DC | PRN
  Administered 2018-05-20: 0.5 mg via ORAL
  Filled 2018-05-20: qty 1

## 2018-05-20 MED ORDER — LORAZEPAM 0.5 MG PO TABS
0.5000 mg | ORAL_TABLET | Freq: Three times a day (TID) | ORAL | Status: DC | PRN
  Administered 2018-05-20: 0.5 mg via ORAL
  Filled 2018-05-20: qty 1

## 2018-05-20 MED ORDER — ISOSORBIDE MONONITRATE ER 30 MG PO TB24
30.0000 mg | ORAL_TABLET | Freq: Every day | ORAL | Status: DC
  Administered 2018-05-20 – 2018-05-22 (×3): 30 mg via ORAL
  Filled 2018-05-20 (×3): qty 1

## 2018-05-20 MED ORDER — INSULIN ASPART 100 UNIT/ML ~~LOC~~ SOLN
0.0000 [IU] | Freq: Three times a day (TID) | SUBCUTANEOUS | Status: DC
  Administered 2018-05-20 (×2): 8 [IU] via SUBCUTANEOUS
  Administered 2018-05-21 (×2): 5 [IU] via SUBCUTANEOUS
  Administered 2018-05-21: 11 [IU] via SUBCUTANEOUS
  Administered 2018-05-22 (×2): 3 [IU] via SUBCUTANEOUS

## 2018-05-20 NOTE — Progress Notes (Signed)
Progress Note  Patient Name: Marco [name] Date of Encounter: 05/20/2018  Primary Cardiologist: [name], MD   Subjective   Patient feeling better this AM. Off BiPAP, now on [place] with improvement in respiratory status. Patient endorses some L sided chest wall pain, reproducible on palpation. Endorse moderate SOB today. He also endorses anxiety and asks when he'll be able to go home, he was told he be provided something for anxiety and we will have to take it day by day as we work to get fluid off.  Inpatient Medications    Scheduled Meds: . aspirin EC  81 mg Oral Daily  . atorvastatin  40 mg Oral Q2200  . carvedilol  3.125 mg Oral BID WC  . chlorhexidine  15 mL Mouth Rinse BID  . DULoxetine  20 mg Oral Daily  . enoxaparin (LOVENOX) injection  40 mg Subcutaneous Q24H  . febuxostat  40 mg Oral Daily  . HYDROcodone-acetaminophen  1 tablet Oral TID  . mouth rinse  15 mL Mouth Rinse q12n4p  . potassium chloride  40 mEq Oral BID  . promethazine  6.25 mg Intravenous Once  . sacubitril-valsartan  1 tablet Oral BID  . sodium chloride flush  3 mL Intravenous Q12H   Continuous Infusions: . sodium chloride Stopped (05/19/18 1400)  . furosemide 120 mg (05/20/18 0818)  . nitroGLYCERIN Stopped (05/19/18 1800)   PRN Meds: sodium chloride, acetaminophen, albuterol, LORazepam, nitroGLYCERIN, ondansetron (ZOFRAN) IV, sodium chloride flush, zolpidem   Vital Signs    Vitals:   05/20/18 0300 05/20/18 0400 05/20/18 0500 05/20/18 0533  BP: (!) 135/98 127/90 129/77   Pulse: 78 81 85   Resp:  19 18   Temp:  97.7 F (36.5 C)    TempSrc:  Axillary    SpO2: 98% 98% 97%   Weight:    97 kg  Height:        Intake/Output Summary (Last 24 hours) at 05/20/2018 0950 Last data filed at 05/20/2018 0910 Gross per 24 hour  Intake 183.11 ml  Output 3800 ml  Net -3616.89 ml   Filed Weights   05/18/18 1438 05/18/18 2100 05/20/18 0533  Weight: 99.8 kg 99.5 kg 97 kg    Telemetry    Sinus  with few PVCs - Personally Reviewed  ECG    Sinus with nonspecific TWA - Personally Reviewed  Physical Exam   GEN: obese BM in NAD  Neck: No JVD Cardiac: RRR, no murmurs, rubs, or gallops.  Respiratory: bilateral rales GI: Soft, nontender, non-distended MS: No edema; No deformity; R radial cath site clean, no hematoma, pulses intact Neuro:  Nonfocal  Psych: Normal affect   Labs    Chemistry Recent Labs  Lab 05/18/18 1443 05/19/18 0330 05/19/18 1428 05/19/18 1430  NA 141 144 143 145  K 2.9* 3.4* 3.4* 3.3*  CL 106 108 110 107  CO2 25 26 21*  --   GLUCOSE 238* 80 161* 163*  BUN 17 17 14 15   CREATININE 1.46* 1.36* 1.40* 1.40*  CALCIUM 8.5* 8.5* 8.8*  --   GFRNONAA 49* 54* 52*  --   GFRAA 57* >60 >60  --   ANIONGAP 10 10 12   --      Hematology Recent Labs  Lab 05/18/18 1443 05/19/18 0330 05/19/18 1430 05/20/18 0420  WBC 9.1 10.2  --  7.9  RBC 4.60 4.49  --  4.68  HGB 13.1 12.8* 15.3 13.8  HCT 43.9 42.0 45.0 43.9  MCV 95.4 93.5  --  93.8  MCH 28.5 28.5  --  29.5  MCHC 29.8* 30.5  --  31.4  RDW 15.1 15.0  --  15.2  PLT 224 237  --  228    Cardiac Enzymes Recent Labs  Lab 05/18/18 2105 05/19/18 0330 05/19/18 0718  TROPONINI 0.16* 0.19* 0.17*    Recent Labs  Lab 05/18/18 1502  TROPIPOC 0.25*     BNP Recent Labs  Lab 05/18/18 1443  BNP 412.9*     DDimer No results for input(s): DDIMER in the last 168 hours.   Radiology    Dg Chest 2 View  Result Date: 05/18/2018 CLINICAL DATA:  Chest pain EXAM: CHEST - 2 VIEW COMPARISON:  04/29/2018 FINDINGS: Heart size upper normal. Negative for heart failure. No focal consolidation. Small right effusion has developed in the interval. IMPRESSION: Small right pleural effusion without evidence of pulmonary edema. Electronically Signed   By: Franchot Gallo M.D.   On: 05/18/2018 15:20   Dg Chest [place] 1v Same Day  Result Date: 05/19/2018 CLINICAL DATA:  Respiratory distress, CHF, coronary artery disease,  chronic renal insufficiency, former smoker. EXAM: PORTABLE CHEST 1 VIEW COMPARISON:  PA and lateral chest x-ray of May 18, 2018 FINDINGS: The lungs remain mildly hyperinflated. The pulmonary interstitial markings have increased. The pulmonary vascularity is more engorged. The cardiac silhouette is enlarged. There is calcification in the wall of the thoracic aorta. There is no pleural effusion. The observed bony thorax is unremarkable. IMPRESSION: CHF with pulmonary interstitial edema. The appearance of the chest has deteriorated since yesterday's study. Electronically Signed   By: David  Martinique M.D.   On: 05/19/2018 13:59    Cardiac Studies   Echo 04/21/18: Study Conclusions - Left ventricle: Wall thickness was increased in a pattern of   severe LVH. There was focal basal hypertrophy. Systolic function   was moderately reduced. The estimated ejection fraction was in   the range of 35% to 40%. Severe hypokinesis of the inferior   myocardium. Doppler parameters are consistent with abnormal left   ventricular relaxation (grade 1 diastolic dysfunction).  Cath 10/9:  - Ost 2nd Diag lesion is 35% stenosed. - Prox LAD to Mid LAD lesion is 20% stenosed. - Ost Cx to Prox Cx lesion is 10% stenosed. - There is severe left ventricular systolic dysfunction. - LV end diastolic pressure is moderately elevated. - The left ventricular ejection fraction is 25-35% by visual estimate.   1.  Mild nonobstructive coronary artery disease. 2.  Severely reduced LV systolic function with an EF of 25 to 30% with severe global hypokinesis. 3.  Moderately to severely elevated left ventricular end-diastolic pressure at 28 mmHg.  Recommendations: The patient has nonischemic cardiomyopathy and he is significantly volume overloaded.  I recommend diuresis with furosemide 40 mg intravenously twice daily. I discontinued hydralazine and added a small dose carvedilol and Entresto.  Patient Profile     62 y.o. male with  history of nonischemic CM, nonobstructive CAD, DM, HTN presents with progressive chest pain and dyspnea. Elevated troponin. Cath showed non-obstructive CAD. Patient with increased respiratory distress following cath with signs and symptoms of volume overload agrresive diuresis  Assessment & Plan    1) Acute on chronic systolic CHF: Presented with progressive SOB and Chest pain. Cath showed non obstructive CAD and decreased EF. Respiratory status declined on 10/9 and patient was moved to [place] and started on agressive diuresis, with 3.2L off yesterday. Respiratory status improved today, but remains volume up with rales on exam.  EF 25-30% on Echo with diffuse hypokinesis. Will consider restarting hydralazine and IMDUR.  - Continue Entresto and low dose Coreg - Will monitor renal function with Entresto - Continue Lasix 120mg  IV BID - Strict I/O and daily weight.  - Daily BMP  2) Nonobstructive CAD: Risk factor modification.  3) HTN: On IV NTG gtt   4) Anxiety: PRN Ativan PO 5) Hypokalemia: With IV lasix, will replete PRN 6) CKD stage 3: Will monitor Renal function. 7) OSA: On CPAP at home, BiPAP last night 8) DM: SSI 9) Hx of afib: s/p ablation without recurrence.   For questions or updates, please contact [place] Please consult [email or web address] for contact info under     Signed, Neva Seat, MD  05/20/2018, 9:50 AM

## 2018-05-21 LAB — GLUCOSE, CAPILLARY
GLUCOSE-CAPILLARY: 229 mg/dL — AB (ref 70–99)
GLUCOSE-CAPILLARY: 228 mg/dL — AB (ref 70–99)
Glucose-Capillary: 329 mg/dL — ABNORMAL HIGH (ref 70–99)
Glucose-Capillary: 276 mg/dL — ABNORMAL HIGH (ref 70–99)

## 2018-05-21 LAB — BASIC METABOLIC PANEL
ANION GAP: 10 (ref 5–15)
BUN: 14 mg/dL (ref 8–23)
CO2: 29 mmol/L (ref 22–32)
Calcium: 8.6 mg/dL — ABNORMAL LOW (ref 8.9–10.3)
Chloride: 102 mmol/L (ref 98–111)
Creatinine, Ser: 1.54 mg/dL — ABNORMAL HIGH (ref 0.61–1.24)
GFR, EST AFRICAN AMERICAN: 54 mL/min — AB (ref >60)
GFR, EST NON AFRICAN AMERICAN: 46 mL/min — AB (ref >60)
GLUCOSE: 169 mg/dL — AB (ref 70–99)
POTASSIUM: 3.9 mmol/L (ref 3.5–5.1)
Sodium: 141 mmol/L (ref 135–145)

## 2018-05-21 MED ORDER — PROMETHAZINE HCL 25 MG PO TABS
12.5000 mg | ORAL_TABLET | Freq: Four times a day (QID) | ORAL | Status: DC | PRN
  Administered 2018-05-21 – 2018-05-22 (×2): 12.5 mg via ORAL
  Filled 2018-05-21 (×2): qty 1

## 2018-05-21 MED ORDER — FUROSEMIDE 40 MG PO TABS
40.0000 mg | ORAL_TABLET | Freq: Two times a day (BID) | ORAL | Status: DC
  Administered 2018-05-21 – 2018-05-22 (×2): 40 mg via ORAL
  Filled 2018-05-21 (×2): qty 1

## 2018-05-21 NOTE — Progress Notes (Signed)
Progress Note  Patient Name: Marco [name] Date of Encounter: 05/21/2018  Primary Cardiologist: [name], MD   Subjective   Patient fell well this AM. He is off supplemental oxygen. Breathing well today. Does endorse some nausea, states Zofran does not work for him. Less anxious today. Informed him he would be transferred to the floor today.  Inpatient Medications    Scheduled Meds: . aspirin EC  81 mg Oral Daily  . atorvastatin  40 mg Oral Q2200  . carvedilol  3.125 mg Oral BID WC  . chlorhexidine  15 mL Mouth Rinse BID  . DULoxetine  20 mg Oral Daily  . enoxaparin (LOVENOX) injection  40 mg Subcutaneous Q24H  . febuxostat  40 mg Oral Daily  . furosemide  40 mg Oral BID  . hydrALAZINE  10 mg Oral Q8H  . HYDROcodone-acetaminophen  1 tablet Oral TID  . insulin aspart  0-15 Units Subcutaneous TID WC  . insulin aspart  0-5 Units Subcutaneous QHS  . isosorbide mononitrate  30 mg Oral Daily  . mouth rinse  15 mL Mouth Rinse q12n4p  . potassium chloride  40 mEq Oral BID  . promethazine  6.25 mg Intravenous Once  . sacubitril-valsartan  1 tablet Oral BID  . sodium chloride flush  3 mL Intravenous Q12H   Continuous Infusions: . sodium chloride Stopped (05/19/18 1400)   PRN Meds: sodium chloride, acetaminophen, albuterol, LORazepam, nitroGLYCERIN, promethazine, sodium chloride flush, zolpidem   Vital Signs    Vitals:   05/21/18 0730 05/21/18 0900 05/21/18 0930 05/21/18 1000  BP: (!) 141/89 128/81 128/80 (!) 135/110  Pulse: 90 89 92 88  Resp: 20 15 (!) 21 18  Temp:      TempSrc:      SpO2: 95% 93% 95% 95%  Weight:      Height:        Intake/Output Summary (Last 24 hours) at 05/21/2018 1134 Last data filed at 05/21/2018 1000 Gross per 24 hour  Intake 1431.97 ml  Output 1405 ml  Net 26.97 ml   Filed Weights   05/18/18 2100 05/20/18 0533 05/21/18 0500  Weight: 99.5 kg 97 kg 95.7 kg    Telemetry    Sinus, few PVCs, 9 beat run of NSVT - Personally  Reviewed  ECG    None today  Physical Exam   GEN: obese BM in NAD  Neck: No JVD Cardiac: RRR, no murmurs, rubs, or gallops.  Respiratory: Trace bibasilar rales GI: Soft, nontender, non-distended MS: No edema; No deformity; R radial cath site clean, no hematoma, pulses intact Neuro:  Nonfocal  Psych: Normal affect   Labs    Chemistry Recent Labs  Lab 05/19/18 1428 05/19/18 1430 05/20/18 0910 05/21/18 0247  NA 143 145 144 141  K 3.4* 3.3* 4.5 3.9  CL 110 107 105 102  CO2 21*  --  28 29  GLUCOSE 161* 163* 211* 169*  BUN 14 15 14 14   CREATININE 9.89* 1.40* 1.59* 1.54*  CALCIUM 8.8*  --  9.0 8.6*  GFRNONAA 52*  --  45* 46*  GFRAA >60  --  52* 54*  ANIONGAP 12  --  11 10     Hematology Recent Labs  Lab 05/18/18 1443 05/19/18 0330 05/19/18 1430 05/20/18 0420  WBC 9.1 10.2  --  7.9  RBC 4.60 4.49  --  4.68  HGB 13.1 12.8* 15.3 13.8  HCT 43.9 42.0 45.0 43.9  MCV 95.4 93.5  --  93.8  MCH 28.5  28.5  --  29.5  MCHC 29.8* 30.5  --  31.4  RDW 15.1 15.0  --  15.2  PLT 224 237  --  228    Cardiac Enzymes Recent Labs  Lab 05/18/18 2105 05/19/18 0330 05/19/18 0718  TROPONINI 0.16* 0.19* 0.17*    Recent Labs  Lab 05/18/18 1502  TROPIPOC 0.25*     BNP Recent Labs  Lab 05/18/18 1443  BNP 412.9*     DDimer No results for input(s): DDIMER in the last 168 hours.   Radiology    Dg Chest [place] 1v Same Day  Result Date: 05/19/2018 CLINICAL DATA:  Respiratory distress, CHF, coronary artery disease, chronic renal insufficiency, former smoker. EXAM: PORTABLE CHEST 1 VIEW COMPARISON:  PA and lateral chest x-ray of May 18, 2018 FINDINGS: The lungs remain mildly hyperinflated. The pulmonary interstitial markings have increased. The pulmonary vascularity is more engorged. The cardiac silhouette is enlarged. There is calcification in the wall of the thoracic aorta. There is no pleural effusion. The observed bony thorax is unremarkable. IMPRESSION: CHF with pulmonary  interstitial edema. The appearance of the chest has deteriorated since yesterday's study. Electronically Signed   By: David  Martinique M.D.   On: 05/19/2018 13:59    Cardiac Studies   Echo 04/21/18: Study Conclusions - Left ventricle: Wall thickness was increased in a pattern of   severe LVH. There was focal basal hypertrophy. Systolic function   was moderately reduced. The estimated ejection fraction was in   the range of 35% to 40%. Severe hypokinesis of the inferior   myocardium. Doppler parameters are consistent with abnormal left   ventricular relaxation (grade 1 diastolic dysfunction).  Cath 10/9:  - Ost 2nd Diag lesion is 35% stenosed. - Prox LAD to Mid LAD lesion is 20% stenosed. - Ost Cx to Prox Cx lesion is 10% stenosed. - There is severe left ventricular systolic dysfunction. - LV end diastolic pressure is moderately elevated. - The left ventricular ejection fraction is 25-35% by visual estimate.   1.  Mild nonobstructive coronary artery disease. 2.  Severely reduced LV systolic function with an EF of 25 to 30% with severe global hypokinesis. 3.  Moderately to severely elevated left ventricular end-diastolic pressure at 28 mmHg.  Recommendations: The patient has nonischemic cardiomyopathy and he is significantly volume overloaded.  I recommend diuresis with furosemide 40 mg intravenously twice daily. I discontinued hydralazine and added a small dose carvedilol and Entresto.  Patient Profile     62 y.o. male with history of nonischemic CM, nonobstructive CAD, DM, HTN presents with progressive chest pain and dyspnea. Elevated troponin. Cath showed non-obstructive CAD. Patient with increased respiratory distress following cath with signs and symptoms of volume overload. Responding well to diuresis.  Assessment & Plan    1) Acute on chronic systolic CHF: Presented with progressive SOB and Chest pain. Cath showed non obstructive CAD and decreased EF. Respiratory status declined  on 10/9 and patient was moved to [place] and started and has responded well to high dose IV Lasix. EF 25-30% on Echo with diffuse hypokinesis; Previous EF 35-40% earlier this year. Volume status much improved. Now down ~10lbs per weight. Breathing well on room air now. Will transition to PO Lasix and transfer to telemetry. - Will monitor renal function with Entresto, stable thus far - Continue Entresto, Coreg, IMDUR, Hydralazine - Change Lasix to 40mg  PO BID - Strict I/O and daily weight - Daily BMP  2) Nonobstructive CAD: Risk factor modification.  3) HTN:  [id number] systolic this AM. - Hydralazine, Coreg, Entresto and Lasix as above  4) Anxiety: PRN Ativan PO 5) Hypokalemia: With IV lasix, will replete PRN 6) CKD stage 3: Will monitor Renal function. 7) OSA: On CPAP at home, Continue CPAP qhs 8) DM: SSI 9) Hx of afib: s/p ablation without recurrence. 10) Nausea: change PRN to phenergan, do not suspect this is due to medications   For questions or updates, please contact [place] Please consult [email or web address] for contact info under     Signed, Neva Seat, MD  05/21/2018, 11:34 AM

## 2018-05-21 NOTE — Progress Notes (Signed)
New CPAP machine set up for the night with large full face mask and humidifier filled.  Pt tolerating well at this time.

## 2018-05-21 NOTE — Progress Notes (Signed)
Inpatient Diabetes Program Recommendations  AACE/ADA: New Consensus Statement on Inpatient Glycemic Control (2015)  Target Ranges:  Prepandial:   less than 140 mg/dL      Peak postprandial:   less than 180 mg/dL (1-2 hours)      Critically ill patients:  140 - 180 mg/dL   Lab Results  Component Value Date   GLUCAP 329 (H) 05/21/2018   HGBA1C 8.8 (A) 04/14/2018    Review of Glycemic Control Results for Marco [name], Marco [name] (MRN [id number]) as of 05/21/2018 11:25  Ref. Range 05/20/2018 15:07 05/20/2018 22:21 05/21/2018 08:24  Glucose-Capillary Latest Ref Range: 70 - 99 mg/dL 272 (H) 221 (H) 329 (H)   Diabetes history: DM 2 Outpatient Diabetes medications: Levemir 180 units qam Current orders for Inpatient glycemic control: Novolog 0-15 units TID, Novolog 0-5 units QHS   Inpatient Diabetes Program Recommendations:    AM FSBS was 329 mg/dL, consider adding back a portion of basal insulin: Levemir 25 units QD.  Thanks, Bronson Curb, MSN, RNC-OB Diabetes Coordinator [phone] (8a-5p)

## 2018-05-22 ENCOUNTER — Other Ambulatory Visit: Payer: Self-pay | Admitting: Cardiology

## 2018-05-22 DIAGNOSIS — I10 Essential (primary) hypertension: Secondary | ICD-10-CM

## 2018-05-22 DIAGNOSIS — R0789 Other chest pain: Secondary | ICD-10-CM

## 2018-05-22 DIAGNOSIS — Z79899 Other long term (current) drug therapy: Secondary | ICD-10-CM

## 2018-05-22 LAB — GLUCOSE, CAPILLARY
Glucose-Capillary: 163 mg/dL — ABNORMAL HIGH (ref 70–99)
Glucose-Capillary: 190 mg/dL — ABNORMAL HIGH (ref 70–99)

## 2018-05-22 LAB — BASIC METABOLIC PANEL
Anion gap: 10 (ref 5–15)
BUN: 14 mg/dL (ref 8–23)
CALCIUM: 8.9 mg/dL (ref 8.9–10.3)
CO2: 29 mmol/L (ref 22–32)
CREATININE: 1.76 mg/dL — AB (ref 0.61–1.24)
Chloride: 102 mmol/L (ref 98–111)
GFR calc Af Amer: 46 mL/min — ABNORMAL LOW (ref >60)
GFR calc non Af Amer: 39 mL/min — ABNORMAL LOW (ref >60)
GLUCOSE: 153 mg/dL — AB (ref 70–99)
Potassium: 4.5 mmol/L (ref 3.5–5.1)
Sodium: 141 mmol/L (ref 135–145)

## 2018-05-22 MED ORDER — CARVEDILOL 3.125 MG PO TABS: 3.1250 mg | ORAL_TABLET | Freq: Two times a day (BID) | ORAL | 1 refills | Status: DC

## 2018-05-22 MED ORDER — HYDRALAZINE HCL 10 MG PO TABS: 10.0000 mg | ORAL_TABLET | Freq: Three times a day (TID) | ORAL | 1 refills | Status: DC

## 2018-05-22 MED ORDER — SACUBITRIL-VALSARTAN 24-26 MG PO TABS: 1.0000 | ORAL_TABLET | Freq: Two times a day (BID) | ORAL | 1 refills | Status: DC

## 2018-05-22 NOTE — Discharge Summary (Addendum)
Discharge Summary    Patient ID: Marco [name],  MRN: [id number], DOB/AGE: 04-25-1956 62 y.o.  Admit date: 05/18/2018 Discharge date: 05/22/2018  Primary Care Provider: [name] Primary Cardiologist: [name]   Discharge Diagnoses    Active Problems:   Chest pain   Acute on chronic congestive heart failure (HCC)   CAD (coronary artery disease)   Essential hypertension   Chronic kidney disease   Diabetes mellitus with complication (HCC)   Hyperlipidemia   Hypokalemia   Demand ischemia (HCC)   Allergies No Known Allergies  Diagnostic Studies/Procedures    Echo 04/21/18:Study Conclusions - Left ventricle: Wall thickness was increased in a pattern of severe LVH. There was focal basal hypertrophy. Systolic function was moderately reduced. The estimated ejection fraction was in the range of 35% to 40%. Severe hypokinesis of the inferior myocardium. Doppler parameters are consistent with abnormal left ventricular relaxation (grade 1 diastolic dysfunction).  Cath 10/9:  - Ost 2nd Diag lesion is 35% stenosed. - Prox LAD to Mid LAD lesion is 20% stenosed. - Ost Cx to Prox Cx lesion is 10% stenosed. - There is severe left ventricular systolic dysfunction. - LV end diastolic pressure is moderately elevated. - The left ventricular ejection fraction is 25-35% by visual estimate.  1. Mild nonobstructive coronary artery disease. 2. Severely reduced LV systolic function with an EF of 25 to 30% with severe global hypokinesis. 3. Moderately to severely elevated left ventricular end-diastolic pressure at 28 mmHg.  Recommendations: The patient has nonischemic cardiomyopathy and he is significantly volume overloaded. I recommend diuresis with furosemide 40 mg intravenously twice daily. I discontinued hydralazine and added a small dose carvedilol and Entresto. _____________   History of Present Illness     Marco [name] is a 62 year old male was past  medical history of NICM with improved EF, CAD, combined chronic systolic and diastolic heart failure, history of persistent atrial fibrillation s/p ablation in [place] in 2017, DM 2, hypertension, CKD stage III, and obstructive sleep apnea.  Although he had a EF of 45 to 50%, this was improved to normal by echocardiogram in June 2018.  Anticoagulation has been discontinued in the past due to anemia.  He also has a chronic dyspnea and also chronic chest discomfort as well.  He was seen by [name] in August 2019, in light of his shortness of breath, echocardiogram was obtained which showed EF down to 35 to 40% with inferior hypokinesis.  His ARB was stopped and he was started on hydralazine/nitrate combo.  He was seen by [name], [name] on 04/30/2018, coronary CTA with FFR was ordered to evaluate the drop in the ejection fraction.  This has not been done.  Since Sunday, he has been noticing intermittent sharp stabbing chest pain with exertion and relieved by rest.  He also complained of shortness of breath with exertion as well.  His wife has been trying to convince him to come to the hospital since Sunday, however he did not wish to.  Since early this morning, the chest pain recurred and has been persistent since.  He currently has 5 out of 10 chest pain.  Initial vital signs stable.  BMP is mildly elevated at 412.9.  Point-of-care troponin also elevated at 0.25.  Potassium is 2.9.  Chest x-ray only revealed a small right pleural effusion without evidence of pulmonary edema.  EKG showed new T wave inversion in the lateral leads when compared to the previous EKG in January 2019.  The T wave inversion has already been present since last EKG in September 2019.  Hospital Course     # Acute on chronic combined HF: Marco [name] developed an acute heart failure exacerbation after cardiac catheterization. Required Bipap. LVEF was 25 to 35% by left ventricular gram during cath.  This was slightly depressed from  35 to 40% on echo 04/2018. He was diuresed with IV lasix. He was transitioned to oral Lasix 05/21/2018.  Renal function was climbing. At home he takes 20 mg p.o. daily.  Plan at discharge was to have him not take any Lasix tomorrow and plan to start back 20 mg daily on Monday and obtain a BMET in the office. It was recommended that he stay overnight but he was anxious to go home.  -- Continue carvedilol, Entresto, Imdur, and hydralazine.    #Non-obstructive CAD: Marco [name] did not have any further any further chest pa post cath.  He had non-obstructive disease on cath.   -- Continue aspirin, atorvastatin, and carvedilol.  # DM: Resume home insulin regimen on discharge.  # Paroxysmal atrial fibrillation: S/p ablation without known recurrence.  Continue carvedilol.   # CKD 3: Creatinine increased to 1.76 from 1.54 the day prior.  Holding lasix at the time of discharge until Monday. Resume his home dose of '20mg'$  daily.  He will get a basic metabolic panel checked at that time.  # Anxiety: Continue duloxetine and PRN lorazepam.  [name] was seen by Dr. Oval Linsey and determined stable for discharge home. Follow up in the office has been arranged. Medications are listed below.   _____________  Discharge Vitals Blood pressure 108/69, pulse 79, temperature 98.8 F (37.1 C), temperature source Oral, resp. rate 18, height '5\' 6"'$  (1.676 m), weight 95.8 kg, SpO2 95 %.  Filed Weights   05/21/18 0500 05/21/18 1500 05/22/18 0417  Weight: 95.7 kg 95.8 kg 95.8 kg    Labs & Radiologic Studies    CBC Recent Labs    05/19/18 1430 05/20/18 0420  WBC  --  7.9  HGB 15.3 13.8  HCT 45.0 43.9  MCV  --  93.8  PLT  --  476   Basic Metabolic Panel Recent Labs    05/19/18 1428  05/21/18 0247 05/22/18 0504  NA 143   < > 141 141  K 3.4*   < > 3.9 4.5  CL 110   < > 102 102  CO2 21*   < > 29 29  GLUCOSE 161*   < > 169* 153*  BUN 14   < > 14 14  CREATININE 1.40*   < > 1.54* 1.76*  CALCIUM  8.8*   < > 8.6* 8.9  MG 2.0  --   --   --    < > = values in this interval not displayed.   Liver Function Tests No results for input(s): AST, ALT, ALKPHOS, BILITOT, PROT, ALBUMIN in the last 72 hours. No results for input(s): LIPASE, AMYLASE in the last 72 hours. Cardiac Enzymes No results for input(s): CKTOTAL, CKMB, CKMBINDEX, TROPONINI in the last 72 hours. BNP Invalid input(s): POCBNP D-Dimer No results for input(s): DDIMER in the last 72 hours. Hemoglobin A1C No results for input(s): HGBA1C in the last 72 hours. Fasting Lipid Panel No results for input(s): CHOL, HDL, LDLCALC, TRIG, CHOLHDL, LDLDIRECT in the last 72 hours. Thyroid Function Tests No results for input(s): TSH, T4TOTAL, T3FREE, THYROIDAB in the last 72 hours.  Invalid input(s): FREET3 _____________  Dg Chest  2 View  Result Date: 05/18/2018 CLINICAL DATA:  Chest pain EXAM: CHEST - 2 VIEW COMPARISON:  04/29/2018 FINDINGS: Heart size upper normal. Negative for heart failure. No focal consolidation. Small right effusion has developed in the interval. IMPRESSION: Small right pleural effusion without evidence of pulmonary edema. Electronically Signed   By: Franchot Gallo M.D.   On: 05/18/2018 15:20   Dg Chest 2 View  Result Date: 04/30/2018 CLINICAL DATA:  Cough and shortness of breath for the past 4-5 days. EXAM: CHEST - 2 VIEW COMPARISON:  03/05/2017 FINDINGS: The cardiac silhouette, mediastinal and hilar contours are slightly prominent but unchanged. Mild tortuosity of the thoracic aorta. Mild chronic underlying bronchitic type lung changes are again demonstrated. Suspect superimposed bronchitis but no definite infiltrates or effusions. IMPRESSION: Underlying bronchitic changes with suspected superimposed acute bronchitis. No definite infiltrates or effusions. Electronically Signed   By: Marijo Sanes M.D.   On: 04/30/2018 09:14   Dg Chest Port 1v Same Day  Result Date: 05/19/2018 CLINICAL DATA:  Respiratory distress,  CHF, coronary artery disease, chronic renal insufficiency, former smoker. EXAM: PORTABLE CHEST 1 VIEW COMPARISON:  PA and lateral chest x-ray of May 18, 2018 FINDINGS: The lungs remain mildly hyperinflated. The pulmonary interstitial markings have increased. The pulmonary vascularity is more engorged. The cardiac silhouette is enlarged. There is calcification in the wall of the thoracic aorta. There is no pleural effusion. The observed bony thorax is unremarkable. IMPRESSION: CHF with pulmonary interstitial edema. The appearance of the chest has deteriorated since yesterday's study. Electronically Signed   By: David  Martinique M.D.   On: 05/19/2018 13:59   Disposition   Pt is being discharged home today in good condition.  Follow-up Plans & Appointments    Follow-up Information    Sueanne Margarita, MD Follow up.   Specialty:  Cardiology Why:  Office will call you with a follow up appt.  Contact information: [address] [address] [phone]        [facility] Follow up on 05/24/2018.   Specialty:  Cardiology Why:  Please come in for follow up labs.  Contact information: [address] [place] [phone]         Discharge Instructions    ([facility]) Call MD:  Anytime you have any of the following symptoms: 1) 3 pound weight gain in 24 hours or 5 pounds in 1 week 2) shortness of breath, with or without a dry hacking cough 3) swelling in the hands, feet or stomach 4) if you have to sleep on extra pillows at night in order to breathe.   Complete by:  As directed    Call MD for:  redness, tenderness, or signs of infection (pain, swelling, redness, odor or green/yellow discharge around incision site)   Complete by:  As directed    Diet - low sodium heart healthy   Complete by:  As directed    Discharge instructions   Complete by:  As directed    For patients with congestive heart  failure, we give them these special instructions:  1. Follow a low-salt diet and watch your fluid intake. In general, you should not be taking in more than 2 liters of fluid per day (no more than 8 glasses per day). Some patients are restricted to less than 1.5 liters of fluid per day (no more than 6 glasses per day). This includes sources of water in foods  like soup, coffee, tea, milk, etc. 2. Weigh yourself on the same scale at same time of day and keep a log. 3. Call your doctor: (Anytime you feel any of the following symptoms)  - 3-4 pound weight gain in 1-2 days or 2 pounds overnight  - Shortness of breath, with or without a dry hacking cough  - Swelling in the hands, feet or stomach  - If you have to sleep on extra pillows at night in order to breathe   IT IS IMPORTANT TO LET YOUR DOCTOR KNOW EARLY ON IF YOU ARE HAVING SYMPTOMS SO WE CAN HELP YOU!   Increase activity slowly   Complete by:  As directed        Discharge Medications     Medication List    STOP taking these medications   bisoprolol 5 MG tablet Commonly known as:  ZEBETA   metoprolol tartrate 50 MG tablet Commonly known as:  LOPRESSOR     TAKE these medications   ACCU-CHEK FASTCLIX LANCETS Misc USE AS DIRECTED TO  CHECK  BLOOD  SUGAR  3  TIMES  DAILY   albuterol 108 (90 Base) MCG/ACT inhaler Commonly known as:  PROVENTIL HFA;VENTOLIN HFA Inhale 2 puffs into the lungs every 6 (six) hours as needed for wheezing or shortness of breath.   allopurinol 100 MG tablet Commonly known as:  ZYLOPRIM Take 200 mg by mouth daily.   aspirin 81 MG EC tablet Take 1 tablet (81 mg total) by mouth daily. Swallow whole. What changed:  additional instructions   atorvastatin 40 MG tablet Commonly known as:  LIPITOR TAKE 1 TABLET BY MOUTH ONCE DAILY   BD PEN NEEDLE NANO U/F 32G X 4 MM Misc Generic drug:  Insulin Pen Needle USE ONE  THREE TIMES DAILY   Blood Pressure Kit 1 each by Does not apply route 2 (two) times  daily.   carvedilol 3.125 MG tablet Commonly known as:  COREG Take 1 tablet (3.125 mg total) by mouth 2 (two) times daily with a meal.   COSENTYX 150 MG/ML Sosy Generic drug:  Secukinumab Inject 150 mg into the skin every 28 (twenty-eight) days.   DULoxetine 20 MG capsule Commonly known as:  CYMBALTA Take 20 mg by mouth daily.   furosemide 20 MG tablet Commonly known as:  LASIX Take 1 tablet (20 mg total) by mouth as needed for edema.   glucose blood test strip 1 each by Other route 2 (two) times daily. And lancets 2/day   hydrALAZINE 10 MG tablet Commonly known as:  APRESOLINE Take 1 tablet (10 mg total) by mouth every 8 (eight) hours. What changed:    medication strength  how much to take  when to take this   HYDROcodone-acetaminophen 7.5-325 MG tablet Commonly known as:  NORCO Take 1 tablet by mouth 3 (three) times daily.   Insulin Detemir 100 UNIT/ML Pen Commonly known as:  LEVEMIR Inject 180 Units into the skin every morning.   isosorbide mononitrate 30 MG 24 hr tablet Commonly known as:  IMDUR Take 1 tablet (30 mg total) by mouth daily.   potassium chloride 10 MEQ tablet Commonly known as:  K-DUR Take 1 tablet (10 mEq total) by mouth daily.   predniSONE 10 MG tablet Commonly known as:  DELTASONE Take 1 tablet (10 mg total) by mouth daily.   pregabalin 100 MG capsule Commonly known as:  LYRICA Take 1 capsule (100 mg total) by mouth 3 (three) times daily.   ranitidine 150 MG  tablet Commonly known as:  ZANTAC Take 1 tablet (150 mg total) by mouth daily before breakfast.   sacubitril-valsartan 24-26 MG Commonly known as:  ENTRESTO Take 1 tablet by mouth 2 (two) times daily.   sildenafil 20 MG tablet Commonly known as:  REVATIO Take 20-40 mg by mouth as needed (for erectile dysfunction).   ULORIC 40 MG tablet Generic drug:  febuxostat Take 40 mg by mouth daily.   zolpidem 6.25 MG CR tablet Commonly known as:  AMBIEN CR Take 1 tablet (6.25 mg  total) by mouth at bedtime as needed for sleep. Each prescription to last 1 month. What changed:  additional instructions        Acute coronary syndrome (MI, NSTEMI, STEMI, etc) this admission?: No.   Outstanding Labs/Studies   BMET on Monday.   Duration of Discharge Encounter   Greater than 30 minutes including physician time.  Signed, Reino Bellis NP-C  05/22/2018, 12:54 PM\   Patient seen and examined.  Plan as discussed in my rounding note and outlined above.   Yu Cragun C. Oval Linsey, MD, [facility]  06/06/2018   10:17 PM

## 2018-05-22 NOTE — Progress Notes (Signed)
Pt discharge education went over with family  Pt discharged via wheelchair with NT  Pt has all belongings including entresto card, case manager aware

## 2018-05-22 NOTE — Plan of Care (Signed)
  Problem: Education: Goal: Knowledge of General Education information will improve Description Including pain rating scale, medication(s)/side effects and non-pharmacologic comfort measures Outcome: Progressing   Problem: Health Behavior/Discharge Planning: Goal: Ability to manage health-related needs will improve Outcome: Progressing   Problem: Clinical Measurements: Goal: Ability to maintain clinical measurements within normal limits will improve Outcome: Progressing Goal: Will remain free from infection Outcome: Progressing Goal: Diagnostic test results will improve Outcome: Progressing Goal: Respiratory complications will improve Outcome: Progressing Goal: Cardiovascular complication will be avoided Outcome: Progressing   Problem: Activity: Goal: Risk for activity intolerance will decrease Outcome: Progressing   Problem: Nutrition: Goal: Adequate nutrition will be maintained Outcome: Progressing   Problem: Coping: Goal: Level of anxiety will decrease Outcome: Progressing   Problem: Elimination: Goal: Will not experience complications related to bowel motility Outcome: Progressing   Problem: Pain Managment: Goal: General experience of comfort will improve Outcome: Progressing   Problem: Safety: Goal: Ability to remain free from injury will improve Outcome: Progressing   Problem: Skin Integrity: Goal: Risk for impaired skin integrity will decrease Outcome: Progressing   Problem: Education: Goal: Understanding of cardiac disease, CV risk reduction, and recovery process will improve Outcome: Progressing   Problem: Activity: Goal: Ability to tolerate increased activity will improve Outcome: Progressing   Problem: Cardiac: Goal: Ability to achieve and maintain adequate cardiovascular perfusion will improve Outcome: Progressing   Problem: Health Behavior/Discharge Planning: Goal: Ability to safely manage health-related needs after discharge will  improve Outcome: Progressing   Problem: Elimination: Goal: Will not experience complications related to urinary retention Outcome: Not Met (add Reason)   Problem: Education: Goal: Individualized Educational Video(s) Outcome: Not Met (add Reason)

## 2018-05-22 NOTE — Progress Notes (Signed)
Pt discharge education provided at bedside. Pt has all belongings. Pt IVs removed, catheter intact and telemetry removed. Pt called family for transportation.

## 2018-05-22 NOTE — Progress Notes (Signed)
Progress Note  Patient Name: Marco [name] Date of Encounter: 05/22/2018  Primary Cardiologist: Marco Him, MD   Subjective   Feeling well.  Wants to go home.  No recurrent chest pain.  Inpatient Medications    Scheduled Meds: . aspirin EC  81 mg Oral Daily  . atorvastatin  40 mg Oral Q2200  . carvedilol  3.125 mg Oral BID WC  . chlorhexidine  15 mL Mouth Rinse BID  . DULoxetine  20 mg Oral Daily  . enoxaparin (LOVENOX) injection  40 mg Subcutaneous Q24H  . febuxostat  40 mg Oral Daily  . furosemide  40 mg Oral BID  . hydrALAZINE  10 mg Oral Q8H  . HYDROcodone-acetaminophen  1 tablet Oral TID  . insulin aspart  0-15 Units Subcutaneous TID WC  . insulin aspart  0-5 Units Subcutaneous QHS  . isosorbide mononitrate  30 mg Oral Daily  . mouth rinse  15 mL Mouth Rinse q12n4p  . potassium chloride  40 mEq Oral BID  . promethazine  6.25 mg Intravenous Once  . sacubitril-valsartan  1 tablet Oral BID  . sodium chloride flush  3 mL Intravenous Q12H   Continuous Infusions: . sodium chloride Stopped (05/19/18 1400)   PRN Meds: sodium chloride, acetaminophen, albuterol, LORazepam, nitroGLYCERIN, promethazine, sodium chloride flush, zolpidem   Vital Signs    Vitals:   05/21/18 2135 05/22/18 0417 05/22/18 0812 05/22/18 0919  BP: 138/81 (!) 131/93  121/70  Pulse:  79 96 83  Resp:  18 19   Temp:  98.4 F (36.9 C)    TempSrc:  Oral    SpO2:  100% 96%   Weight:  95.8 kg    Height:        Intake/Output Summary (Last 24 hours) at 05/22/2018 1047 Last data filed at 05/22/2018 0514 Gross per 24 hour  Intake 723 ml  Output 850 ml  Net -127 ml   Filed Weights   05/21/18 0500 05/21/18 1500 05/22/18 0417  Weight: 95.7 kg 95.8 kg 95.8 kg    Telemetry    Sinus rhythm.- Personally Reviewed  ECG    n/a - Personally Reviewed  Physical Exam   VS:  BP 121/70 (BP Location: Left Arm)   Pulse 83   Temp 98.4 F (36.9 C) (Oral)   Resp 19   Ht 5\' 6"  (1.676 m)   Wt 95.8  kg   SpO2 96%   BMI 34.10 kg/m  , BMI Body mass index is 34.1 kg/m. GENERAL:  Well appearing.  No acute distress HEENT: Pupils equal round and reactive, fundi not visualized, oral mucosa unremarkable NECK:  No jugular venous distention, waveform within normal limits, carotid upstroke brisk and symmetric, no bruits, no thyromegaly LYMPHATICS:  No cervical adenopathy LUNGS:  Clear to auscultation bilaterally HEART:  RRR.  PMI not displaced or sustained,S1 and S2 within normal limits, no S3, no S4, no clicks, no rubs, no murmurs ABD:  Flat, positive bowel sounds normal in frequency in pitch, no bruits, no rebound, no guarding, no midline pulsatile mass, no hepatomegaly, no splenomegaly EXT:  2 plus pulses throughout, no edema, no cyanosis no clubbing SKIN:  No rashes no nodules NEURO:  Cranial nerves II through XII grossly intact, motor grossly intact throughout [facility]:  Cognitively intact, oriented to person place and time   Labs    Chemistry Recent Labs  Lab 05/20/18 0910 05/21/18 0247 05/22/18 0504  NA 144 141 141  K 4.5 3.9 4.5  CL 105 102  102  CO2 28 29 29   GLUCOSE 211* 169* 153*  BUN 14 14 14   CREATININE 1.59* 1.54* 1.76*  CALCIUM 9.0 8.6* 8.9  GFRNONAA 45* 46* 39*  GFRAA 52* 54* 46*  ANIONGAP 11 10 10      Hematology Recent Labs  Lab 05/18/18 1443 05/19/18 0330 05/19/18 1430 05/20/18 0420  WBC 9.1 10.2  --  7.9  RBC 4.60 4.49  --  4.68  HGB 13.1 12.8* 15.3 13.8  HCT 43.9 42.0 45.0 43.9  MCV 95.4 93.5  --  93.8  MCH 28.5 28.5  --  29.5  MCHC 29.8* 30.5  --  31.4  RDW 15.1 15.0  --  15.2  PLT 224 237  --  228    Cardiac Enzymes Recent Labs  Lab 05/18/18 2105 05/19/18 0330 05/19/18 0718  TROPONINI 0.16* 0.19* 0.17*    Recent Labs  Lab 05/18/18 1502  TROPIPOC 0.25*     BNP Recent Labs  Lab 05/18/18 1443  BNP 412.9*     DDimer No results for input(s): DDIMER in the last 168 hours.   Radiology    No results found.  Cardiac Studies    Echo 04/21/18: Study Conclusions - Left ventricle: Wall thickness was increased in a pattern of severe LVH. There was focal basal hypertrophy. Systolic function was moderately reduced. The estimated ejection fraction was in the range of 35% to 40%. Severe hypokinesis of the inferior myocardium. Doppler parameters are consistent with abnormal left ventricular relaxation (grade 1 diastolic dysfunction).  Cath 10/9:  - Ost 2nd Diag lesion is 35% stenosed. - Prox LAD to Mid LAD lesion is 20% stenosed. - Ost Cx to Prox Cx lesion is 10% stenosed. - There is severe left ventricular systolic dysfunction. - LV end diastolic pressure is moderately elevated. - The left ventricular ejection fraction is 25-35% by visual estimate.  1. Mild nonobstructive coronary artery disease. 2. Severely reduced LV systolic function with an EF of 25 to 30% with severe global hypokinesis. 3. Moderately to severely elevated left ventricular end-diastolic pressure at 28 mmHg.  Recommendations: The patient has nonischemic cardiomyopathy and he is significantly volume overloaded. I recommend diuresis with furosemide 40 mg intravenously twice daily. I discontinued hydralazine and added a small dose carvedilol and Entresto.  Patient Profile     62 y.o. male with chronic systolic and diastolic heart failure, nonobstructive CAD, hypertension, hyperlipidemia, and diabetes admitted with chest pain and elevated troponin.  Cardiac catheterization revealed non-obstructive CAD.  Assessment & Plan    # Acute on chronic systolic and diastolic heart failure: # Essential hypertension: Marco [name] developed an acute heart failure exacerbation after cardiac catheterization.  LVEF was 25 to 35% by left ventricular gram during cath.  This was slightly depressed from 35 to 40% on echo 04/2018.  He was transitioned to oral Lasix 05/21/2018.  Renal function is climbing.  He already received a dose of Lasix this morning.    At home he takes 20 mg p.o. daily.  We will have [name] not take any Lasix tomorrow and plan to start back 20 mg daily on Monday.  He will need a basic metabolic panel on Monday.  He will also need follow-up within the week.  Recommended that he stay overnight but he is anxious to go home.  Continue carvedilol, Entresto, Imdur, and hydralazine.   # Chest pain:  #Non-obstructive CAD: Mr. Snyders is not having any further chest pain.  He had non-obstructive disease on cath.  Continue aspirin, atorvastatin,  and carvedilol.  # DM: Resume home insulin regimen on discharge.  # Paroxysmal atrial fibrillation: S/p ablation without known recurrence.  Continue carvedilol.   # CKD 3: Creatinine increased to 1.76 from 1.54 yesterday.  Holding lasix today and tomorrow.  Resume his home dose of 20 mill grams daily on Monday.  He will get a basic metabolic panel checked at that time.  # Anxiety: Continue duloxetine and PRN lorazepam.  For questions or updates, please contact [facility] Please consult [email or web address] for contact info under        Signed, Skeet Latch, MD  05/22/2018, 10:47 AM

## 2018-05-25 ENCOUNTER — Other Ambulatory Visit: Payer: Medicaid Other

## 2018-05-25 ENCOUNTER — Other Ambulatory Visit: Payer: Self-pay | Admitting: Internal Medicine

## 2018-05-25 ENCOUNTER — Ambulatory Visit: Payer: Medicaid Other | Attending: Internal Medicine | Admitting: Internal Medicine

## 2018-05-25 ENCOUNTER — Encounter: Payer: Self-pay | Admitting: Internal Medicine

## 2018-05-25 VITALS — BP 117/75 | HR 77 | Temp 98.3°F | Resp 16 | Wt 218.8 lb

## 2018-05-25 DIAGNOSIS — I2510 Atherosclerotic heart disease of native coronary artery without angina pectoris: Secondary | ICD-10-CM | POA: Diagnosis not present

## 2018-05-25 DIAGNOSIS — I5042 Chronic combined systolic (congestive) and diastolic (congestive) heart failure: Secondary | ICD-10-CM | POA: Diagnosis not present

## 2018-05-25 DIAGNOSIS — Z79899 Other long term (current) drug therapy: Secondary | ICD-10-CM

## 2018-05-25 DIAGNOSIS — I130 Hypertensive heart and chronic kidney disease with heart failure and stage 1 through stage 4 chronic kidney disease, or unspecified chronic kidney disease: Secondary | ICD-10-CM | POA: Insufficient documentation

## 2018-05-25 DIAGNOSIS — Z87891 Personal history of nicotine dependence: Secondary | ICD-10-CM | POA: Insufficient documentation

## 2018-05-25 DIAGNOSIS — G8929 Other chronic pain: Secondary | ICD-10-CM | POA: Insufficient documentation

## 2018-05-25 DIAGNOSIS — Z6835 Body mass index (BMI) 35.0-35.9, adult: Secondary | ICD-10-CM | POA: Insufficient documentation

## 2018-05-25 DIAGNOSIS — E876 Hypokalemia: Secondary | ICD-10-CM | POA: Diagnosis not present

## 2018-05-25 DIAGNOSIS — M109 Gout, unspecified: Secondary | ICD-10-CM | POA: Insufficient documentation

## 2018-05-25 DIAGNOSIS — E11319 Type 2 diabetes mellitus with unspecified diabetic retinopathy without macular edema: Secondary | ICD-10-CM | POA: Diagnosis not present

## 2018-05-25 DIAGNOSIS — E1142 Type 2 diabetes mellitus with diabetic polyneuropathy: Secondary | ICD-10-CM | POA: Insufficient documentation

## 2018-05-25 DIAGNOSIS — Z794 Long term (current) use of insulin: Secondary | ICD-10-CM | POA: Insufficient documentation

## 2018-05-25 DIAGNOSIS — I10 Essential (primary) hypertension: Secondary | ICD-10-CM

## 2018-05-25 DIAGNOSIS — N183 Chronic kidney disease, stage 3 unspecified: Secondary | ICD-10-CM

## 2018-05-25 DIAGNOSIS — G4733 Obstructive sleep apnea (adult) (pediatric): Secondary | ICD-10-CM | POA: Insufficient documentation

## 2018-05-25 DIAGNOSIS — F329 Major depressive disorder, single episode, unspecified: Secondary | ICD-10-CM | POA: Insufficient documentation

## 2018-05-25 DIAGNOSIS — E669 Obesity, unspecified: Secondary | ICD-10-CM | POA: Diagnosis not present

## 2018-05-25 DIAGNOSIS — E1121 Type 2 diabetes mellitus with diabetic nephropathy: Secondary | ICD-10-CM | POA: Diagnosis not present

## 2018-05-25 DIAGNOSIS — E118 Type 2 diabetes mellitus with unspecified complications: Secondary | ICD-10-CM

## 2018-05-25 DIAGNOSIS — I428 Other cardiomyopathies: Secondary | ICD-10-CM | POA: Diagnosis not present

## 2018-05-25 DIAGNOSIS — E785 Hyperlipidemia, unspecified: Secondary | ICD-10-CM | POA: Insufficient documentation

## 2018-05-25 DIAGNOSIS — I4891 Unspecified atrial fibrillation: Secondary | ICD-10-CM | POA: Diagnosis not present

## 2018-05-25 DIAGNOSIS — E1122 Type 2 diabetes mellitus with diabetic chronic kidney disease: Secondary | ICD-10-CM | POA: Insufficient documentation

## 2018-05-25 DIAGNOSIS — Z7982 Long term (current) use of aspirin: Secondary | ICD-10-CM | POA: Insufficient documentation

## 2018-05-25 DIAGNOSIS — L602 Onychogryphosis: Secondary | ICD-10-CM

## 2018-05-25 DIAGNOSIS — G4700 Insomnia, unspecified: Secondary | ICD-10-CM | POA: Diagnosis not present

## 2018-05-25 DIAGNOSIS — L4050 Arthropathic psoriasis, unspecified: Secondary | ICD-10-CM

## 2018-05-25 LAB — GLUCOSE, POCT (MANUAL RESULT ENTRY): POC Glucose: 261 mg/dl — AB (ref 70–99)

## 2018-05-25 MED ORDER — PREGABALIN 100 MG PO CAPS: 100.0000 mg | ORAL_CAPSULE | Freq: Three times a day (TID) | ORAL | 3 refills | Status: DC

## 2018-05-25 NOTE — Progress Notes (Signed)
Patient ID: Marco [name], male    DOB: Jun 27, 1956  MRN: [id number]  CC: Diabetes   Subjective: Marco [name] is a 62 y.o. male who presents for hospital follow-up His concerns today include:  62 year old male with history of HTN,DM2 with neuropathy and nephropathy, CKD stage III, obesity, nonobstructive CAD, chronic combined CHF, OSA on CPAP, followed by rheumatology in [facility] for psoriasis/gout/nonspec arthritis. On narcotics through [facility] for chronic pain in chest/back/shoulders/neck,  Insomnia  Patient hospitalized 10/8-07/2018 with chest pain and acute on chronic CHF.  Patient had cardiac catheterization that revealed nonobstructive CAD and reduced LVEF of 25 to 35%.  Patient was diuresed.  He had some worsening renal insufficiency with diuresis.  He was started on Entresto and carvedilol.  Discharge note states that hydralazine was discontinued however it was still on the list for his discharge medications and he is still taking it.  He takes furosemide as needed.  NICM/HTN: Since discharge patient states that he has been doing better.  Shortness of breath significantly better.  Weight today at home was 215 pounds.  He endorses compliance with medications and salt restrictions.   BP has been good on home monitoring.  He has an appointment with cardiology next week.  He had BMP drawn today at their office.  CKD: Saw nephrologist Dr. Johnney Ou in August.  His chronic kidney disease assessed to most likely be due to diabetic nephropathy  DM: Saw endocrinologist [name] since last visit with me.  NovoLog was discontinued and Levemir was increased to 180 units daily.  Checking  BS in a.m before BF.  He reports that his blood sugars have been better.  Highest has been 160.   Not eating fast foods any more.  Doing more home cooked meals.   Patient Active Problem List   Diagnosis Date Noted  . Demand ischemia ([place])   . Acute on chronic congestive heart failure ([place])   . Chest  pain 05/18/2018  . Hypokalemia   . NSTEMI (non-ST elevated myocardial infarction) ([place])   . Acute on chronic systolic (congestive) heart failure ([place])   . Renal failure (ARF), acute on chronic (HCC) 08/27/2017  . Dysphagia 01/09/2017  . Hyperlipidemia 12/23/2016  . Chronic combined systolic and diastolic CHF (congestive heart failure) ([place]) 09/09/2016  . Diabetes mellitus with complication ([place])   . Diabetic retinopathy ([place]) 07/22/2016  . Diabetic polyneuropathy associated with type 2 diabetes mellitus ([place]) 06/10/2016  . Myalgia and myositis 03/31/2016  . Dyspnea on exertion 02/09/2016  . Chronic gouty arthritis 11/29/2015  . Ache in joint 11/27/2015  . LBP (low back pain) 11/27/2015  . Arthralgia of multiple joints 11/27/2015  . Psoriasis 11/27/2015  . Breast pain in male 11/14/2015  . Sinusitis, chronic 10/16/2015  . Insomnia 10/16/2015  . Adult BMI 30+ 10/04/2015  . New onset of headaches after age 40 09/20/2015  . OSA (obstructive sleep apnea) 07/12/2015  . Low serum testosterone level 05/18/2015  . Depression 05/16/2015  . Chronic pain   . CAD (coronary artery disease) 12/24/2014  . Atrial fibrillation ([place]) 12/24/2014  . Essential hypertension 12/24/2014  . Chronic kidney disease 12/24/2014  . PUD (peptic ulcer disease) 12/24/2014     Current Outpatient Medications on File Prior to Visit  Medication Sig Dispense Refill  . ACCU-CHEK FASTCLIX LANCETS MISC USE AS DIRECTED TO  CHECK  BLOOD  SUGAR  3  TIMES  DAILY 102 each 11  . albuterol (PROVENTIL HFA;VENTOLIN HFA) 108 (90 Base) MCG/ACT inhaler  Inhale 2 puffs into the lungs every 6 (six) hours as needed for wheezing or shortness of breath. (Patient not taking: Reported on 05/18/2018) 1 Inhaler 0  . allopurinol (ZYLOPRIM) 100 MG tablet Take 200 mg by mouth daily.     [name] aspirin (EQ ASPIRIN ADULT LOW DOSE) 81 MG EC tablet Take 1 tablet (81 mg total) by mouth daily. Swallow whole. (Patient taking differently: Take 81 mg by  mouth daily. ) 90 tablet 3  . atorvastatin (LIPITOR) 40 MG tablet TAKE 1 TABLET BY MOUTH ONCE DAILY (Patient taking differently: Take 40 mg by mouth daily. ) 90 tablet 0  . BD PEN NEEDLE NANO U/F 32G X 4 MM MISC USE ONE  THREE TIMES DAILY 100 each 11  . Blood Pressure KIT 1 each by Does not apply route 2 (two) times daily. 1 each 0  . carvedilol (COREG) 3.125 MG tablet Take 1 tablet (3.125 mg total) by mouth 2 (two) times daily with a meal. 60 tablet 1  . DULoxetine (CYMBALTA) 20 MG capsule Take 20 mg by mouth daily.    . furosemide (LASIX) 20 MG tablet Take 1 tablet (20 mg total) by mouth as needed for edema. 30 tablet 0  . glucose blood (ACCU-CHEK AVIVA PLUS) test strip 1 each by Other route 2 (two) times daily. And lancets 2/day 200 each 3  . hydrALAZINE (APRESOLINE) 10 MG tablet Take 1 tablet (10 mg total) by mouth every 8 (eight) hours. 90 tablet 1  . HYDROcodone-acetaminophen (NORCO) 7.5-325 MG tablet Take 1 tablet by mouth 3 (three) times daily.  0  . Insulin Detemir (LEVEMIR FLEXTOUCH) 100 UNIT/ML Pen Inject 180 Units into the skin every morning. 20 pen 2  . isosorbide mononitrate (IMDUR) 30 MG 24 hr tablet Take 1 tablet (30 mg total) by mouth daily. 90 tablet 3  . potassium chloride (K-DUR) 10 MEQ tablet Take 1 tablet (10 mEq total) by mouth daily. 90 tablet 3  . predniSONE (DELTASONE) 10 MG tablet Take 1 tablet (10 mg total) by mouth daily. 30 tablet 1  . ranitidine (ZANTAC) 150 MG tablet Take 1 tablet (150 mg total) by mouth daily before breakfast. 90 tablet 1  . sacubitril-valsartan (ENTRESTO) 24-26 MG Take 1 tablet by mouth 2 (two) times daily. 60 tablet 1  . Secukinumab (COSENTYX) 150 MG/ML SOSY Inject 150 mg into the skin every 28 (twenty-eight) days.     . sildenafil (REVATIO) 20 MG tablet Take 20-40 mg by mouth as needed (for erectile dysfunction).     [name] ULORIC 40 MG tablet Take 40 mg by mouth daily.  0  . zolpidem (AMBIEN CR) 6.25 MG CR tablet Take 1 tablet (6.25 mg total) by  mouth at bedtime as needed for sleep. Each prescription to last 1 month. (Patient taking differently: Take 6.25 mg by mouth at bedtime as needed for sleep. ) 30 tablet 3   No current facility-administered medications on file prior to visit.     No Known Allergies  Social History   Socioeconomic History  . Marital status: Married    Spouse name: Amethyst  . Number of children: 8  . Years of education: 80  . Highest education level: Not on file  Occupational History    Comment: disabled  Social Needs  . Financial resource strain: Not on file  . Food insecurity:    Worry: Not on file    Inability: Not on file  . Transportation needs:    Medical: Not on file  Non-medical: Not on file  Tobacco Use  . Smoking status: Former Smoker    Packs/day: 0.50    Years: 10.00    Pack years: 5.00    Types: Cigarettes    Last attempt to quit: 04/11/2008    Years since quitting: 10.1  . Smokeless tobacco: Never Used  Substance and Sexual Activity  . Alcohol use: No    Alcohol/week: 0.0 standard drinks  . Drug use: No  . Sexual activity: Yes    Partners: Female    Birth control/protection: None  Lifestyle  . Physical activity:    Days per week: Not on file    Minutes per session: Not on file  . Stress: Not on file  Relationships  . Social connections:    Talks on phone: Not on file    Gets together: Not on file    Attends religious service: Not on file    Active member of club or organization: Not on file    Attends meetings of clubs or organizations: Not on file    Relationship status: Not on file  . Intimate partner violence:    Fear of current or ex partner: Not on file    Emotionally abused: Not on file    Physically abused: Not on file    Forced sexual activity: Not on file  Other Topics Concern  . Not on file  Social History Narrative   Lives with wife. Does not work.  On disability.    Caffeine- coffee, 1/2 cup daily    Family History  Problem Relation Age of  Onset  . Hypertension Mother   . Diabetes Mother   . COPD Mother   . Lung cancer Mother        Smoker   . Hypertension Father   . Diabetes Father   . Heart Problems Father   . COPD Father   . Colon cancer Neg Hx   . Stomach cancer Neg Hx     Past Surgical History:  Procedure Laterality Date  . CARDIAC CATHETERIZATION  05/2014   ablation for atrial fibrillation  . CARDIAC CATHETERIZATION N/A 11/16/2015   Procedure: Left Heart Cath and Coronary Angiography;  Surgeon: Leonie Man, MD;  Location: [facility];  Service: Cardiovascular;  Laterality: N/A;  . COLON RESECTION  09/2013   due to large, abnormal polpy. non cancerous per patient.   . COLON SURGERY  09/2014   colon resection   . LEFT HEART CATH AND CORONARY ANGIOGRAPHY N/A 05/19/2018   Procedure: LEFT HEART CATH AND CORONARY ANGIOGRAPHY;  Surgeon: Wellington Hampshire, MD;  Location: [facility];  Service: Cardiovascular;  Laterality: N/A;    ROS: Review of Systems Negative except as above. PHYSICAL EXAM: BP 117/75   Pulse 77   Temp 98.3 F (36.8 C) (Oral)   Resp 16   Wt 218 lb 12.8 oz (99.2 kg)   SpO2 95%   BMI 35.32 kg/m   Wt Readings from Last 3 Encounters:  05/25/18 218 lb 12.8 oz (99.2 kg)  05/22/18 211 lb 4.8 oz (95.8 kg)  04/30/18 220 lb 1.9 oz (99.8 kg)   Physical Exam  General appearance - alert, well appearing, and in no distress Mental status - normal mood, behavior, speech, dress, motor activity, and thought processes Mouth - mucous membranes moist, pharynx normal without lesions Neck - supple, no significant adenopathy Chest - clear to auscultation, no wheezes, rales or rhonchi, symmetric air entry Heart - normal rate, regular  rhythm, normal S1, S2, no murmurs, rubs, clicks or gallops Extremities - peripheral pulses normal, no pedal edema, no clubbing or cyanosis Diabetic Foot Exam - Simple   Simple Foot Form Visual Inspection See comments:  Yes Sensation Testing Intact to touch  and monofilament testing bilaterally:  Yes Pulse Check Posterior Tibialis and Dorsalis pulse intact bilaterally:  Yes Comments Toenails are overgrown     Results for orders placed or performed in visit on 05/25/18  POCT glucose (manual entry)  Result Value Ref Range   POC Glucose 261 (A) 70 - 99 mg/dl   Lab Results  Component Value Date   HGBA1C 8.8 (A) 04/14/2018     Chemistry      Component Value Date/Time   NA 141 05/22/2018 0504   NA 144 04/29/2018 1717   K 4.5 05/22/2018 0504   CL 102 05/22/2018 0504   CO2 29 05/22/2018 0504   BUN 14 05/22/2018 0504   BUN 18 04/29/2018 1717   CREATININE 1.76 (H) 05/22/2018 0504   CREATININE 1.42 (H) 09/09/2016 1037      Component Value Date/Time   CALCIUM 8.9 05/22/2018 0504   ALKPHOS 65 04/21/2018 0726   AST 13 04/21/2018 0726   ALT 23 04/21/2018 0726   BILITOT 0.3 04/21/2018 0726      ASSESSMENT AND PLAN: 1. NICM (nonischemic cardiomyopathy) ([place]) Nonobstructive CAD On guideline directed therapy. Continue to limit salt.  He will continue to weigh himself at home at least 3 times a week and take furosemide for increased weight or increased lower extremity edema  2. CKD (chronic kidney disease) stage 3, GFR 30-59 ml/min (HCC) Repeat BMP today done at cardiologist office.  Results not back as yet.  3. Diabetes mellitus with complication (HCC) Better controlled.  He will keep follow-up appointment with endocrine next month. - POCT glucose (manual entry)  4. Diabetic polyneuropathy associated with type 2 diabetes mellitus (HCC) - pregabalin (LYRICA) 100 MG capsule; Take 1 capsule (100 mg total) by mouth 3 (three) times daily.  Dispense: 90 capsule; Refill: 3  5. Essential hypertension At goal.  6. Overgrown toenails Recommend follow-up with his podiatrist to have his toenails clipped.  Patient states he will schedule this appointment himself.  Patient was given the opportunity to ask questions.  Patient verbalized  understanding of the plan and was able to repeat key elements of the plan.   Orders Placed This Encounter  Procedures  . POCT glucose (manual entry)     Requested Prescriptions   Signed Prescriptions Disp Refills  . pregabalin (LYRICA) 100 MG capsule 90 capsule 3    Sig: Take 1 capsule (100 mg total) by mouth 3 (three) times daily.    Return in about 2 months (around 07/25/2018).  Karle Plumber, MD, FACP

## 2018-05-26 LAB — BASIC METABOLIC PANEL
BUN/Creatinine Ratio: 10 (ref 10–24)
BUN: 16 mg/dL (ref 8–27)
CALCIUM: 9.1 mg/dL (ref 8.6–10.2)
CO2: 23 mmol/L (ref 20–29)
Chloride: 103 mmol/L (ref 96–106)
Creatinine, Ser: 1.64 mg/dL — ABNORMAL HIGH (ref 0.76–1.27)
GFR calc Af Amer: 51 mL/min/{1.73_m2} — ABNORMAL LOW (ref >59)
GFR calc non Af Amer: 44 mL/min/{1.73_m2} — ABNORMAL LOW (ref >59)
Glucose: 258 mg/dL — ABNORMAL HIGH (ref 65–99)
POTASSIUM: 4.9 mmol/L (ref 3.5–5.2)
Sodium: 143 mmol/L (ref 134–144)

## 2018-05-30 ENCOUNTER — Encounter: Payer: Self-pay | Admitting: Physician Assistant

## 2018-05-30 NOTE — Progress Notes (Signed)
Cardiology Office Note    Date:  05/31/2018  ID:  [name], DOB [birth year], MRN [id number] PCP:  [name], MD  Cardiologist:  [name], MD   Chief Complaint: f/u CHF  History of Present Illness:  Marco [name] is a 62 y.o. male with history of DM, HTN, non-obstructive CAD (caths 2017 and 05/2018),  chronic combined CHF (variable EF over the years, rececent decline to 35-40% by echo 04/2018 and 25-30% by cath 05/2018), persistent atrial fibrillation (s/p RFCA in [place] 2017, Xarelto previousy d/c'd 2/2 anemia and unknown source of blood loss), morbid obesity, OSA (incomplete compliance with CPAP due to intolerance), CKD III, DM, esophageal stricture, prior gastritis 2014, rheumatoid arthritis who presents for f/u of CHF.  Marco [name] has had extensive workup and close monitoring over the years. LHC 11/2015 showed minimal CAD, mild calcification noted extraluminally, normal LVF, normal LVEDP. CPX 06/2016 with limited interpretation due to submaximal effort during exercise, difficult to interpret, also of note was very intolerant to discomfort from several processes during the CPX procedure today (i.e. motion on bike, electrode removal, etc). 2D echo 09/03/16 showed EF 45-50% with global HK, grade 2 DD, elevated LV filling pressure. CTA 09/02/16 was negative for PE. He saw [name] 12/23/16 with some complaints of exertional SOB/chest tightness with elevated BNP. Lasix was increased. Spironolactone was added and losartan was increased. Nuclear stress test 12/2016 was done for atypical CP 01/01/17 showing small defect of severe severity in apical septal and apex location, no ischemia, EF 44% - suspected artifact. Limited echo 01/20/17 showed EF 50-55%, hypokinesis of apical inferoseptal myocardium, grade 1 diastolic dysfunction, mild MR, no sig change from prior. Event monitoring 12/2016 showed NSR with occasional PVCs, otherwise benign. ETT 02/2017 was nondiagnostic due to 20 seconds of exercise  and severe SOB.  He was seen by [name] in 03/2018 for SOB and found to have a drop in EF to 35-40% with inferior hypokinesis. ARB was stopped and he was started on hydralazine/nitrate combo. He was seen by [name], [name] on 04/30/2018 and coronary CTA was planned. Before this could be performed, however, he developed sharp stabbing chest pain with exertion and worsening DOE. Labs were notable for mildly elevated troponin of 0.25 and hypokalemia of 2.9, with small pleural effusion on CXR. Cardiac cath 05/19/18 showed mild nonobstructive CAD, mod-severely elevated LVEDP 28, EF 25-30%. He was admited for further IV diuresis. Hydralazine was discontinued and carvedilol/Entresto were added. With diuresis, Cr rose from baseline around 1.4 to 1.76 therefore Lasix was held briefly with resumption of home dose of Lasix '20mg'$  on 10/14. Labs otherwise showed Hgb 13.8, LFTs wnl. Repeat Cr on 10/15 was 1.64.  He returns for follow-up today reporting he feels "great." This is the first time in years he has not felt as SOB. He does get DOE with higher levels of activity but before recent hospitalization was getting SOB even putting cereal in a bowl. His weight at home the day of DC was around 213 (when diuretic was held for a few days), up to 217 then 219 the last few days. Clinically he does not feel like he is retaining more fluid. Prior to recent events his weight was around 223. No CP. He clarified he is no longer taking Revatio (discussed interaction w/ Imdur).  Past Medical History:  Diagnosis Date  . Benign colon polyp 08/01/2013   [facility] in [place]. large base tranverse colon polyp was biopsied. polyp was benign with  minimal surface hyperplastic change.  . Chronic combined systolic and diastolic CHF (congestive heart failure) ([place])   . Chronic pain   . CKD (chronic kidney disease), stage III ([place])   . Diabetes mellitus without complication ([place])   . Diverticulosis   . Esophageal  hiatal hernia 07/29/2013   confirmed on EGD   . Esophageal stricture   . Essential hypertension   . Gastritis 07/29/2013   confirmed on EGD, bx done an negative for intestinal metaplasia, dsyplasia or H. pylori. normal gastric emptying study done 07/13/2013.  [name] GERD (gastroesophageal reflux disease)   . Morbid obesity ([place])   . Non-obstructive CAD    a. 02/2013 Cath ([place]): nonobs dzs;  b. 09/2014 Myoview ([place]): EF 55%, no ischemia;  c. Cath 05/2018 mild CAD no obstruction.  . Persistent atrial fibrillation    a. 02/2013 s/p rfca in [place], [place]-->prev on Xarelto, d/c'd 2/2 anemia, ? GIB.  [name] Rheumatoid arthritis ([place])   . Sigmoid diverticulosis 08/01/2013   confirmed on colonscopy. record scanned into chart    Past Surgical History:  Procedure Laterality Date  . CARDIAC CATHETERIZATION  05/2014   ablation for atrial fibrillation  . CARDIAC CATHETERIZATION N/A 11/16/2015   Procedure: Left Heart Cath and Coronary Angiography;  Surgeon: Leonie Man, MD;  Location: [facility];  Service: Cardiovascular;  Laterality: N/A;  . COLON RESECTION  09/2013   due to large, abnormal polpy. non cancerous per patient.   . COLON SURGERY  09/2014   colon resection   . LEFT HEART CATH AND CORONARY ANGIOGRAPHY N/A 05/19/2018   Procedure: LEFT HEART CATH AND CORONARY ANGIOGRAPHY;  Surgeon: Wellington Hampshire, MD;  Location: [facility];  Service: Cardiovascular;  Laterality: N/A;    Current Medications: Current Meds  Medication Sig  . ACCU-CHEK FASTCLIX LANCETS MISC USE AS DIRECTED TO  CHECK  BLOOD  SUGAR  3  TIMES  DAILY  . albuterol (PROVENTIL HFA;VENTOLIN HFA) 108 (90 Base) MCG/ACT inhaler Inhale 2 puffs into the lungs every 6 (six) hours as needed for wheezing or shortness of breath.  [name] aspirin (EQ ASPIRIN ADULT LOW DOSE) 81 MG EC tablet Take 1 tablet (81 mg total) by mouth daily. Swallow whole.  [name] atorvastatin (LIPITOR) 40 MG tablet TAKE 1 TABLET BY MOUTH ONCE DAILY  . BD PEN NEEDLE  NANO U/F 32G X 4 MM MISC USE ONE  THREE TIMES DAILY  . Blood Pressure KIT 1 each by Does not apply route 2 (two) times daily.  . carvedilol (COREG) 3.125 MG tablet Take 1 tablet (3.125 mg total) by mouth 2 (two) times daily with a meal.  . DULoxetine (CYMBALTA) 20 MG capsule Take 20 mg by mouth daily.  . furosemide (LASIX) 20 MG tablet Take 20 mg by mouth daily.  [name] glucose blood (ACCU-CHEK AVIVA PLUS) test strip 1 each by Other route 2 (two) times daily. And lancets 2/day  . hydrALAZINE (APRESOLINE) 10 MG tablet Take 1 tablet (10 mg total) by mouth every 8 (eight) hours.  [name] HYDROcodone-acetaminophen (NORCO) 7.5-325 MG tablet Take 1 tablet by mouth 3 (three) times daily.  . Insulin Detemir (LEVEMIR FLEXTOUCH) 100 UNIT/ML Pen Inject 180 Units into the skin every morning.  . isosorbide mononitrate (IMDUR) 30 MG 24 hr tablet Take 1 tablet (30 mg total) by mouth daily.  . potassium chloride (K-DUR) 10 MEQ tablet Take 1 tablet (10 mEq total) by mouth daily.  . predniSONE (DELTASONE) 10 MG tablet Take 1 tablet (  10 mg total) by mouth daily.  . pregabalin (LYRICA) 100 MG capsule Take 1 capsule (100 mg total) by mouth 3 (three) times daily.  . ranitidine (ZANTAC) 150 MG tablet Take 1 tablet (150 mg total) by mouth daily before breakfast.  . sacubitril-valsartan (ENTRESTO) 24-26 MG Take 1 tablet by mouth 2 (two) times daily.  . Secukinumab (COSENTYX) 150 MG/ML SOSY Inject 150 mg into the skin every 28 (twenty-eight) days.   . sildenafil (REVATIO) 20 MG tablet Take 20-40 mg by mouth as needed (for erectile dysfunction).   [name] ULORIC 40 MG tablet Take 40 mg by mouth daily.  [name] zolpidem (AMBIEN CR) 6.25 MG CR tablet Take 1 tablet (6.25 mg total) by mouth at bedtime as needed for sleep. Each prescription to last 1 month.  . [DISCONTINUED] furosemide (LASIX) 20 MG tablet Take 1 tablet (20 mg total) by mouth as needed for edema. (Patient taking differently: Take 20 mg by mouth daily. )    Allergies:   Patient has  no known allergies.   Social History   Socioeconomic History  . Marital status: Married    Spouse name: Amethyst  . Number of children: 8  . Years of education: 34  . Highest education level: Not on file  Occupational History    Comment: disabled  Social Needs  . Financial resource strain: Not on file  . Food insecurity:    Worry: Not on file    Inability: Not on file  . Transportation needs:    Medical: Not on file    Non-medical: Not on file  Tobacco Use  . Smoking status: Former Smoker    Packs/day: 0.50    Years: 10.00    Pack years: 5.00    Types: Cigarettes    Last attempt to quit: 04/11/2008    Years since quitting: 10.1  . Smokeless tobacco: Never Used  Substance and Sexual Activity  . Alcohol use: No    Alcohol/week: 0.0 standard drinks  . Drug use: No  . Sexual activity: Yes    Partners: Female    Birth control/protection: None  Lifestyle  . Physical activity:    Days per week: Not on file    Minutes per session: Not on file  . Stress: Not on file  Relationships  . Social connections:    Talks on phone: Not on file    Gets together: Not on file    Attends religious service: Not on file    Active member of club or organization: Not on file    Attends meetings of clubs or organizations: Not on file    Relationship status: Not on file  Other Topics Concern  . Not on file  Social History Narrative   Lives with wife. Does not work.  On disability.    Caffeine- coffee, 1/2 cup daily     Family History:  The patient's family history includes COPD in his father and mother; Diabetes in his father and mother; Heart Problems in his father; Hypertension in his father and mother; Lung cancer in his mother. There is no history of Colon cancer or Stomach cancer.  ROS:   Please see the history of present illness.  All other systems are reviewed and otherwise negative.    PHYSICAL EXAM:   VS:  BP (!) 122/58   Pulse 78   Ht '5\' 6"'$  (1.676 m)   Wt 222 lb 12.8 oz  (101.1 kg)   SpO2 95%   BMI 35.96 kg/m  BMI: Body mass index is 35.96 kg/m. GEN: Well nourished, well developed obese AAM, in no acute distress HEENT: normocephalic, atraumatic Neck: no JVD, carotid bruits, or masses Cardiac: RRR; no murmurs, rubs, or gallops, no edema  Respiratory:  clear to auscultation bilaterally, normal work of breathing GI: soft, nontender, nondistended, + BS MS: no deformity or atrophy Skin: warm and dry, no rash. Right radial cath site without hematoma or ecchymosis; good pulse. Neuro:  Alert and Oriented x 3, Strength and sensation are intact, follows commands Psych: euthymic mood, full affect  Wt Readings from Last 3 Encounters:  05/31/18 222 lb 12.8 oz (101.1 kg)  05/25/18 218 lb 12.8 oz (99.2 kg)  05/22/18 211 lb 4.8 oz (95.8 kg)      Studies/Labs Reviewed:   EKG:  EKG was not ordered today  Recent Labs: 01/29/2018: TSH 1.370 04/21/2018: ALT 23 05/18/2018: B Natriuretic Peptide 412.9 05/19/2018: Magnesium 2.0 05/20/2018: Hemoglobin 13.8; Platelets 228 05/25/2018: BUN 16; Creatinine, Ser 1.64; Potassium 4.9; Sodium 143   Lipid Panel    Component Value Date/Time   CHOL 222 (H) 04/21/2018 0726   TRIG 180 (H) 04/21/2018 0726   HDL 38 (L) 04/21/2018 0726   CHOLHDL 5.8 (H) 04/21/2018 0726   CHOLHDL 2.6 06/10/2016 1253   VLDL 18 06/10/2016 1253   LDLCALC 148 (H) 04/21/2018 0726    Additional studies/ records that were reviewed today include: Summarized above   ASSESSMENT & PLAN:   1. Chronic combined CHF - weight at time of DC was 213lb, but this was also when he was felt to require holding of diuretic for a few days so may have been an overshoot. He does not clinically appear decompensated but weight is up several lb over the last few days. He feels great with recent med changes. Reports compliance with low sodium, fluid restriction, daily weights, meds. Will check BMET and BNP today to help guide decision-making. Etiology of CM unclear. I  wonder if he needs further workup with a cMRI to exclude infiltrative disease. I think it would be worthwhile for him to see the advanced HF clinic. Will refer. At time of next f/u will need to determine timing of repeat echocardiogram to exclude necessity for ICD, as EF was 35-40% by echo but 25-30% by cath.  2. Nonobstructive CAD - continue RF modification.  3. CKD stage III - reassess today. 4. History of atrial fibrillation - quiescent.  Disposition: F/u with [name] in 2-3 months; refer to CHF clinic.   Medication Adjustments/Labs and Tests Ordered: Current medicines are reviewed at length with the patient today.  Concerns regarding medicines are outlined above. Medication changes, Labs and Tests ordered today are summarized above and listed in the Patient Instructions accessible in Encounters.   Signed, Charlie Pitter, [name]  05/31/2018 11:51 AM    [address], [place], [place]  [address] Phone: [phone]; Fax: [phone]

## 2018-05-31 ENCOUNTER — Ambulatory Visit (INDEPENDENT_AMBULATORY_CARE_PROVIDER_SITE_OTHER): Payer: Medicaid Other | Admitting: Physician Assistant

## 2018-05-31 ENCOUNTER — Encounter: Payer: Self-pay | Admitting: Physician Assistant

## 2018-05-31 VITALS — BP 122/58 | HR 78 | Ht 66.0 in | Wt 222.8 lb

## 2018-05-31 DIAGNOSIS — R0602 Shortness of breath: Secondary | ICD-10-CM

## 2018-05-31 DIAGNOSIS — I5042 Chronic combined systolic (congestive) and diastolic (congestive) heart failure: Secondary | ICD-10-CM

## 2018-05-31 DIAGNOSIS — I2510 Atherosclerotic heart disease of native coronary artery without angina pectoris: Secondary | ICD-10-CM | POA: Diagnosis not present

## 2018-05-31 DIAGNOSIS — N183 Chronic kidney disease, stage 3 unspecified: Secondary | ICD-10-CM

## 2018-05-31 DIAGNOSIS — Z8679 Personal history of other diseases of the circulatory system: Secondary | ICD-10-CM

## 2018-05-31 NOTE — Patient Instructions (Addendum)
Medication Instructions:  Your physician recommends that you continue on your current medications as directed. Please refer to the Current Medication list given to you today.  If you need a refill on your cardiac medications before your next appointment, please call your pharmacy.   Lab work: TODAY:  BMET & PRO BNP  If you have labs (blood work) drawn today and your tests are completely normal, you will receive your results only by: [name] MyChart Message (if you have MyChart) OR . A paper copy in the mail If you have any lab test that is abnormal or we need to change your treatment, we will call you to review the results.  Testing/Procedures: None ordered  Follow-Up: At [facility], you and your health needs are our priority.  As part of our continuing mission to provide you with exceptional heart care, we have created designated Provider Care Teams.  These Care Teams include your primary Cardiologist (physician) and Advanced Practice Providers (APPs -  Physician Assistants and Nurse Practitioners) who all work together to provide you with the care you need, when you need it. You will need a follow up appointment in 3 months with [name], MD or one of the following Advanced Practice Providers on your designated Care Team:   [place], [name] [name], [name] . Ermalinda Barrios, [name]  Your physician recommends that you schedule a follow-up appointment in: [place]  ] Any Other Special Instructions Will Be Listed Below (If Applicable).

## 2018-06-01 ENCOUNTER — Telehealth: Payer: Self-pay | Admitting: *Deleted

## 2018-06-01 LAB — BASIC METABOLIC PANEL
BUN / CREAT RATIO: 12 (ref 10–24)
BUN: 19 mg/dL (ref 8–27)
CO2: 24 mmol/L (ref 20–29)
CREATININE: 1.53 mg/dL — AB (ref 0.76–1.27)
Calcium: 9 mg/dL (ref 8.6–10.2)
Chloride: 103 mmol/L (ref 96–106)
GFR calc Af Amer: 55 mL/min/{1.73_m2} — ABNORMAL LOW (ref >59)
GFR calc non Af Amer: 48 mL/min/{1.73_m2} — ABNORMAL LOW (ref >59)
Glucose: 221 mg/dL — ABNORMAL HIGH (ref 65–99)
Potassium: 4.6 mmol/L (ref 3.5–5.2)
Sodium: 142 mmol/L (ref 134–144)

## 2018-06-01 LAB — PRO B NATRIURETIC PEPTIDE: NT-PRO BNP: 636 pg/mL — AB (ref 0–210)

## 2018-06-01 NOTE — Telephone Encounter (Signed)
Patient notified of result.  Please refer to result from today for complete details.   Marco [name], [place] 06/01/2018 2:35 PM

## 2018-06-01 NOTE — Addendum Note (Signed)
Addended by: Gaetano Net on: 06/01/2018 08:59 AM   Modules accepted: Orders

## 2018-06-01 NOTE — Telephone Encounter (Signed)
-----   Message from Charlie Pitter, [place] sent at 06/01/2018  8:45 AM EDT ----- Please let Mr. Drees know labs show stable chronic kidney disease.  BNP is elevated prior to baseline - not markedly so, but he would probably benefit from an extra short burst of diuretic. Given weight trending up I would advise he increase Lasix to 2 tablets (40mg ) daily for 3 days then back to 20mg  daily.  If urine output does not pick up with this change or weight does not go down by 3lb, he should call our office for further instructions.  Does he see a nephrologist? If not, I would recommend we refer (non-urgent) for CKD stage III in the context of heart failure. Blood sugar looks like it needs better control so he needs to f/u PCP for this (can affect long term risk of dialysis, worsening risk of heart attack or vascular disease).  Dayna Dunn [name]

## 2018-06-01 NOTE — Telephone Encounter (Signed)
Called pt re: lab results.  Left him a message to call back.

## 2018-06-08 ENCOUNTER — Telehealth: Payer: Self-pay | Admitting: Cardiology

## 2018-06-08 NOTE — Telephone Encounter (Signed)
Spoke with the patient, he is feeling better than yesterday since taking an additional lasix, he is having difficulty getting weights due to cost and the [employer] scale was not functioning. He is going to find another scale to use and will report back once he has a weight.

## 2018-06-08 NOTE — Telephone Encounter (Signed)
New Message   Pt c/o medication issue:  1. Name of Medication: Lasix  2. How are you currently taking this medication (dosage and times per day)? Take 20 mg by mouth daily.  3. Are you having a reaction (difficulty breathing--STAT)?   4. What is your medication issue? Pt states his weight was 213.8 on 10/25 and 10/28 217.4 and was told to take 2 lasix and rhen drop down to 1 and weigh his self  States his weight was 213.8

## 2018-06-09 NOTE — Progress Notes (Addendum)
Advanced [facility] Failure Clinic Consult Note   Referring Physician: [name], [name]. PCP: [name], MD PCP-Cardiologist: [name], MD  HF: [name]   HPI:  Marco [name] is a 62 y.o. male with history of DM, HTN, non-obstructive CAD (caths 2017 and 05/2018),  chronic combined CHF (variable EF over the years, rececent decline to 35-40% by echo 04/2018 and 25-30% by cath 05/2018), persistent atrial fibrillation (s/p RFCA in [place] 2017, Xarelto previousy d/c'd 2/2 anemia and unknown source of blood loss), morbid obesity, OSA, CKD III, DM, esophageal stricture, prior gastritis 2014, rheumatoid arthritis who presents for f/u of CHF.  LHC 11/2015 showed minimal CAD, mild calcification noted extraluminally, normal LVF, normal LVEDP. CPX 06/2016 with limited interpretation due to submaximal effort during exercise, difficult to interpret, also of note was very intolerant to discomfort from several processes during the CPX procedure today (i.e. motion on bike, electrode removal, etc).  - Echo 09/03/16 EF 45-50% with global HK, grade 2 DD, elevated LV filling pressure. CTA 09/02/16 was negative for PE.  - Nuclear stress test 12/2016 was done for atypical CP showing small defect of severe severity in apical septal and apex location, no ischemia, EF 44% - suspected artifact.  - Echo 01/20/17 EF 50-55%, hypokinesis of apical inferoseptal myocardium, grade 1 diastolic dysfunction, mild MR, no sig change from prior.  - Event monitor 12/2016 showed NSR with occasional PVCs, otherwise benign.  - ETT 02/2017 was nondiagnostic due to 20 seconds of exercise and severe SOB.  He was seen by [name] in 03/2018 for SOB and found to have a drop in EF to 35-40% with inferior hypokinesis. ARB was stopped and he was started on hydralazine/nitrate combo. He was seen by [name], [name] on 04/30/2018 and coronary CTA was planned. Before this could be performed, however, he developed sharp stabbing chest pain with  exertion and worsening DOE. Labs were notable for mildly elevated troponin of 0.25 and hypokalemia of 2.9, with small pleural effusion on CXR. Cardiac cath 05/19/18 showed mild nonobstructive CAD, mod-severely elevated LVEDP 28, EF 25-30%. Admitted for diuresis and meds adjusted. Had AKI and transitioned back to po diuretics.   Last seen in [facility] clinic 05/31/18. Was feeling great since recent med adjustment. With recent difficulties diuresing, sent to HF clinic for evaluation to exclude infiltrative disease.   He presents today to establish with the HF clinic. Weight up 5 lbs since [place] visit. Instructed to take extra lasix at that visit. Feeling OK today. Lives at home alone. Separated wife staying with him currently. Takes all of his medications as directed. Has been followed for "heart problems" for several years. Had RFCA for afib in [place] in 2017. Wears CPAP for 4-5 hrs every night. He is occasionally SOB with ADLs such as bathing and getting dressed. Very occasional lightheadedness with rapid standing but not marked or limiting. He does not smoke or drink. Denies substance abuse.   Echo 05/19/18 LVEF 25-30%, Grade 1 DD, Trivial MR.   LHC 05/19/18  - Ost 2nd Diag lesion is 35% stenosed.  Prox LAD to Mid LAD lesion is 20% stenosed.  Ost Cx to Prox Cx lesion is 10% stenosed.  There is severe left ventricular systolic dysfunction.  LV end diastolic pressure is moderately elevated. The left ventricular ejection fraction is 25-35% by visual estimate. - LVEDP 28  CPX 06/19/2016 - Limited due to submaximal effort and poor amount of information.  FVC 2.35 (65%)    FEV1 1.94 (66%)  FEV1/FVC 82 (102%)     MVV 117 (86%) Post-Exercise PFTs--------- Not performed due to sudden termination of exercise Exercise Time:  1:56      Watts: 10 RPE: 17 Resting HR: 70 Peak HR: 84  (50% age predicted max HR) BP rest: 148/82 BP peak: 156/84 Peak VO2: 9.7 (50% predicted peak VO2) VE/VCO2  slope: 30 OUES: 1.34 Peak RER: 0.97 Ventilatory Threshold: CANNOT BE DETERMINED Peak RR 78 Peak Ventilation: 28.8 VE/MVV: 25% PETCO2 at peak: 36 O2pulse: 11  (79% predicted O2pulse)   Review of systems complete and found to be negative unless listed in HPI.    Past Medical History:  Diagnosis Date  . Benign colon polyp 08/01/2013   [facility] in [place]. large base tranverse colon polyp was biopsied. polyp was benign with minimal surface hyperplastic change.  . Chronic combined systolic and diastolic CHF (congestive heart failure) ([place])   . Chronic pain   . CKD (chronic kidney disease), stage III ([place])   . Diabetes mellitus without complication ([place])   . Diverticulosis   . Esophageal hiatal hernia 07/29/2013   confirmed on EGD   . Esophageal stricture   . Essential hypertension   . Gastritis 07/29/2013   confirmed on EGD, bx done an negative for intestinal metaplasia, dsyplasia or H. pylori. normal gastric emptying study done 07/13/2013.  [name] GERD (gastroesophageal reflux disease)   . Morbid obesity ([place])   . Non-obstructive CAD    a. 02/2013 Cath ([place]): nonobs dzs;  b. 09/2014 Myoview ([place]): EF 55%, no ischemia;  c. Cath 05/2018 mild CAD no obstruction.  . Persistent atrial fibrillation    a. 02/2013 s/p rfca in [place], [place]-->prev on Xarelto, d/c'd 2/2 anemia, ? GIB.  [name] Rheumatoid arthritis ([place])   . Sigmoid diverticulosis 08/01/2013   confirmed on colonscopy. record scanned into chart    Current Outpatient Medications  Medication Sig Dispense Refill  . ACCU-CHEK FASTCLIX LANCETS MISC USE AS DIRECTED TO  CHECK  BLOOD  SUGAR  3  TIMES  DAILY 102 each 11  . albuterol (PROVENTIL HFA;VENTOLIN HFA) 108 (90 Base) MCG/ACT inhaler Inhale 2 puffs into the lungs every 6 (six) hours as needed for wheezing or shortness of breath. 1 Inhaler 0  . allopurinol (ZYLOPRIM) 100 MG tablet Take 100 mg by mouth daily.    [name] aspirin (EQ ASPIRIN ADULT LOW DOSE) 81 MG EC tablet  Take 1 tablet (81 mg total) by mouth daily. Swallow whole. 90 tablet 3  . atorvastatin (LIPITOR) 40 MG tablet TAKE 1 TABLET BY MOUTH ONCE DAILY 90 tablet 0  . BD PEN NEEDLE NANO U/F 32G X 4 MM MISC USE ONE  THREE TIMES DAILY 100 each 11  . Blood Pressure KIT 1 each by Does not apply route 2 (two) times daily. 1 each 0  . carvedilol (COREG) 3.125 MG tablet Take 1 tablet (3.125 mg total) by mouth 2 (two) times daily with a meal. 60 tablet 1  . DULoxetine (CYMBALTA) 20 MG capsule Take 20 mg by mouth daily.    . furosemide (LASIX) 20 MG tablet Take 20 mg by mouth daily. Pt said that he is taking 15m once day    . glucose blood (ACCU-CHEK AVIVA PLUS) test strip 1 each by Other route 2 (two) times daily. And lancets 2/day 200 each 3  . hydrALAZINE (APRESOLINE) 10 MG tablet Take 1 tablet (10 mg total) by mouth every 8 (eight) hours. 90 tablet 1  . HYDROcodone-acetaminophen (NORCO) 7.5-325 MG  tablet Take 1 tablet by mouth 3 (three) times daily.  0  . Insulin Detemir (LEVEMIR FLEXTOUCH) 100 UNIT/ML Pen Inject 180 Units into the skin every morning. 20 pen 2  . isosorbide mononitrate (IMDUR) 30 MG 24 hr tablet Take 1 tablet (30 mg total) by mouth daily. 90 tablet 3  . potassium chloride (K-DUR) 10 MEQ tablet Take 1 tablet (10 mEq total) by mouth daily. 90 tablet 3  . predniSONE (DELTASONE) 10 MG tablet Take 1 tablet (10 mg total) by mouth daily. 30 tablet 1  . pregabalin (LYRICA) 100 MG capsule Take 1 capsule (100 mg total) by mouth 3 (three) times daily. 90 capsule 3  . ranitidine (ZANTAC) 150 MG tablet Take 1 tablet (150 mg total) by mouth daily before breakfast. 90 tablet 1  . sacubitril-valsartan (ENTRESTO) 24-26 MG Take 1 tablet by mouth 2 (two) times daily. 60 tablet 1  . Secukinumab (COSENTYX) 150 MG/ML SOSY Inject 150 mg into the skin every 28 (twenty-eight) days.     [name] zolpidem (AMBIEN CR) 6.25 MG CR tablet Take 1 tablet (6.25 mg total) by mouth at bedtime as needed for sleep. Each prescription to  last 1 month. 30 tablet 3   No current facility-administered medications for this encounter.     No Known Allergies    Social History   Socioeconomic History  . Marital status: Married    Spouse name: Amethyst  . Number of children: 8  . Years of education: 21  . Highest education level: Not on file  Occupational History    Comment: disabled  Social Needs  . Financial resource strain: Not on file  . Food insecurity:    Worry: Not on file    Inability: Not on file  . Transportation needs:    Medical: Not on file    Non-medical: Not on file  Tobacco Use  . Smoking status: Former Smoker    Packs/day: 0.50    Years: 10.00    Pack years: 5.00    Types: Cigarettes    Last attempt to quit: 04/11/2008    Years since quitting: 10.1  . Smokeless tobacco: Never Used  Substance and Sexual Activity  . Alcohol use: No    Alcohol/week: 0.0 standard drinks  . Drug use: No  . Sexual activity: Yes    Partners: Female    Birth control/protection: None  Lifestyle  . Physical activity:    Days per week: Not on file    Minutes per session: Not on file  . Stress: Not on file  Relationships  . Social connections:    Talks on phone: Not on file    Gets together: Not on file    Attends religious service: Not on file    Active member of club or organization: Not on file    Attends meetings of clubs or organizations: Not on file    Relationship status: Not on file  . Intimate partner violence:    Fear of current or ex partner: Not on file    Emotionally abused: Not on file    Physically abused: Not on file    Forced sexual activity: Not on file  Other Topics Concern  . Not on file  Social History Narrative   Lives with wife. Does not work.  On disability.    Caffeine- coffee, 1/2 cup daily      Family History  Problem Relation Age of Onset  . Hypertension Mother   . Diabetes Mother   .  COPD Mother   . Lung cancer Mother        Smoker   . Hypertension Father   . Diabetes  Father   . Heart Problems Father   . COPD Father   . Colon cancer Neg Hx   . Stomach cancer Neg Hx     Vitals:   06/10/18 1145  BP: (!) 152/70  Pulse: 68  SpO2: 98%  Weight: 103 kg (227 lb)    Wt Readings from Last 3 Encounters:  06/10/18 103 kg (227 lb)  05/31/18 101.1 kg (222 lb 12.8 oz)  05/25/18 99.2 kg (218 lb 12.8 oz)     PHYSICAL EXAM: General:  Well appearing. No respiratory difficulty HEENT: normal x for poor dentition.  Neck: supple. JVP difficult. Does not appear markedly elevated  Carotids 2+ bilat; no bruits. No lymphadenopathy or thyromegaly appreciated. Cor: PMI nondisplaced. Regular rate & rhythm. No rubs, gallops or murmurs. Lungs: clear Abdomen: Obese soft, nontender, nondistended. No hepatosplenomegaly. No bruits or masses. Good bowel sounds Extremities: no cyanosis, clubbing, rash, edema Neuro: alert & oriented x 3, cranial nerves grossly intact. moves all 4 extremities w/o difficulty. Affect pleasant   ECG: 05/20/18 NSR 85 bpm, QRS 86 ms, PR interval 118, personally reviewed.  ASSESSMENT & PLAN:  1. Chronic combined systolic and diastolic CHF  - Echo 76/1/60 LVEF 25-30%, Grade 1 DD, Trivial MR.  - Very mild non-obstructive CAD on cath 05/19/18, likely NICM  - NYHA III symptoms - Volume status looks OK on exam.   - Continue lasix 40 mg daily. - Increase Entresto to 49/51 mg BID. Follow renal function closely.  - Continue coreg 3.125 mg BID - Continue hydralazine 10 mg TID - Continue imdur 30 mg daily.  - Etiology unclear. Ideally, would get cMRI, but limited from kidney function and claustrophobia.  - Will order PYP scan. Will check Myeloma panel when returns for repeat labwork.  - Repeat CPX test.  - Reinforced fluid restriction to < 2 L daily, sodium restriction to less than 2000 mg daily, and the importance of daily weights.   2. Nonobstructive CAD  - By cath 05/2018 as above - Continue ASA   3. CKD stage III - BMET today. 4. History of  atrial fibrillation  - s/p RFCA 2017. - Previously on Xarelto, stopped due to anemia and unknown blood loss. Should consider re-challenge.  - CHA2DS2/VASc is at least 4. 5. DM2 - Per PCP 6. Arthritis - Somewhat limiting. On pain medicines.  Meds and labs as above. Will work up for infiltrative cardiomyopathy. Check PYP scan. Will repeat CPX (Done years ago but poor results due to very poor effort).   Shirley Friar, [name] 06/10/18   Patient seen and examined with the above-signed Advanced Practice Provider and/or Housestaff. I personally reviewed laboratory data, imaging studies and relevant notes. I independently examined the patient and formulated the important aspects of the plan. I have edited the note to reflect any of my changes or salient points. I have personally discussed the plan with the patient and/or family.  62 y/o male with longstanding systolic HF due to NICM. Etiology remains unclear. Last ech read as 35-40% but EF looks more like 25-30% to me. Volume status looks ok on exam and no s3 but has progressive NYHA III symptoms. cMRI prohibitive due to CKD and severe claustrophobia. Will get PVP to evaluate for TTR amyloid and CPX to further risk stratify the need for advanced therapies. Agree with increasing Entresto. Will  likely need ICD.  Glori Bickers, MD  6:02 PM

## 2018-06-10 ENCOUNTER — Ambulatory Visit (HOSPITAL_COMMUNITY): Payer: Medicaid Other

## 2018-06-10 ENCOUNTER — Other Ambulatory Visit (HOSPITAL_COMMUNITY): Payer: Self-pay

## 2018-06-10 ENCOUNTER — Ambulatory Visit (HOSPITAL_COMMUNITY)
Admission: RE | Admit: 2018-06-10 | Discharge: 2018-06-10 | Disposition: A | Discharge status: Home / Self Care | Payer: Medicaid Other | Source: Ambulatory Visit | Attending: Internal Medicine | Admitting: Internal Medicine

## 2018-06-10 ENCOUNTER — Encounter (HOSPITAL_COMMUNITY): Payer: Self-pay

## 2018-06-10 ENCOUNTER — Encounter (HOSPITAL_COMMUNITY): Payer: Medicaid Other

## 2018-06-10 VITALS — BP 152/70 | HR 68 | Wt 227.0 lb

## 2018-06-10 DIAGNOSIS — Z87891 Personal history of nicotine dependence: Secondary | ICD-10-CM | POA: Diagnosis not present

## 2018-06-10 DIAGNOSIS — M069 Rheumatoid arthritis, unspecified: Secondary | ICD-10-CM | POA: Diagnosis not present

## 2018-06-10 DIAGNOSIS — J90 Pleural effusion, not elsewhere classified: Secondary | ICD-10-CM | POA: Insufficient documentation

## 2018-06-10 DIAGNOSIS — I480 Paroxysmal atrial fibrillation: Secondary | ICD-10-CM

## 2018-06-10 DIAGNOSIS — I130 Hypertensive heart and chronic kidney disease with heart failure and stage 1 through stage 4 chronic kidney disease, or unspecified chronic kidney disease: Secondary | ICD-10-CM | POA: Insufficient documentation

## 2018-06-10 DIAGNOSIS — I10 Essential (primary) hypertension: Secondary | ICD-10-CM | POA: Diagnosis not present

## 2018-06-10 DIAGNOSIS — Z794 Long term (current) use of insulin: Secondary | ICD-10-CM | POA: Diagnosis not present

## 2018-06-10 DIAGNOSIS — I5042 Chronic combined systolic (congestive) and diastolic (congestive) heart failure: Secondary | ICD-10-CM | POA: Insufficient documentation

## 2018-06-10 DIAGNOSIS — G4733 Obstructive sleep apnea (adult) (pediatric): Secondary | ICD-10-CM | POA: Diagnosis not present

## 2018-06-10 DIAGNOSIS — D649 Anemia, unspecified: Secondary | ICD-10-CM | POA: Insufficient documentation

## 2018-06-10 DIAGNOSIS — E1122 Type 2 diabetes mellitus with diabetic chronic kidney disease: Secondary | ICD-10-CM | POA: Insufficient documentation

## 2018-06-10 DIAGNOSIS — R42 Dizziness and giddiness: Secondary | ICD-10-CM | POA: Insufficient documentation

## 2018-06-10 DIAGNOSIS — I4819 Other persistent atrial fibrillation: Secondary | ICD-10-CM | POA: Insufficient documentation

## 2018-06-10 DIAGNOSIS — F40240 Claustrophobia: Secondary | ICD-10-CM | POA: Diagnosis not present

## 2018-06-10 DIAGNOSIS — I428 Other cardiomyopathies: Secondary | ICD-10-CM | POA: Diagnosis not present

## 2018-06-10 DIAGNOSIS — Z79899 Other long term (current) drug therapy: Secondary | ICD-10-CM | POA: Diagnosis not present

## 2018-06-10 DIAGNOSIS — G8929 Other chronic pain: Secondary | ICD-10-CM | POA: Diagnosis not present

## 2018-06-10 DIAGNOSIS — Z8249 Family history of ischemic heart disease and other diseases of the circulatory system: Secondary | ICD-10-CM | POA: Diagnosis not present

## 2018-06-10 DIAGNOSIS — K219 Gastro-esophageal reflux disease without esophagitis: Secondary | ICD-10-CM | POA: Diagnosis not present

## 2018-06-10 DIAGNOSIS — E876 Hypokalemia: Secondary | ICD-10-CM | POA: Insufficient documentation

## 2018-06-10 DIAGNOSIS — I2510 Atherosclerotic heart disease of native coronary artery without angina pectoris: Secondary | ICD-10-CM | POA: Diagnosis present

## 2018-06-10 DIAGNOSIS — N183 Chronic kidney disease, stage 3 unspecified: Secondary | ICD-10-CM

## 2018-06-10 DIAGNOSIS — Z7982 Long term (current) use of aspirin: Secondary | ICD-10-CM | POA: Insufficient documentation

## 2018-06-10 LAB — BASIC METABOLIC PANEL
ANION GAP: 6 (ref 5–15)
BUN: 16 mg/dL (ref 8–23)
CALCIUM: 8.3 mg/dL — AB (ref 8.9–10.3)
CHLORIDE: 107 mmol/L (ref 98–111)
CO2: 28 mmol/L (ref 22–32)
Creatinine, Ser: 1.3 mg/dL — ABNORMAL HIGH (ref 0.61–1.24)
GFR calc Af Amer: >60 mL/min (ref >60)
GFR calc non Af Amer: 57 mL/min — ABNORMAL LOW (ref >60)
Glucose, Bld: 116 mg/dL — ABNORMAL HIGH (ref 70–99)
Potassium: 3.4 mmol/L — ABNORMAL LOW (ref 3.5–5.1)
SODIUM: 141 mmol/L (ref 135–145)

## 2018-06-10 LAB — BRAIN NATRIURETIC PEPTIDE: B NATRIURETIC PEPTIDE 5: 439.4 pg/mL — AB (ref 0.0–100.0)

## 2018-06-10 MED ORDER — FUROSEMIDE 40 MG PO TABS: 40.0000 mg | ORAL_TABLET | Freq: Every day | ORAL | 3 refills | Status: DC

## 2018-06-10 MED ORDER — POTASSIUM CHLORIDE ER 20 MEQ PO TBCR: 20.0000 meq | EXTENDED_RELEASE_TABLET | Freq: Every day | ORAL | 3 refills | Status: DC

## 2018-06-10 MED ORDER — SACUBITRIL-VALSARTAN 49-51 MG PO TABS: 1.0000 | ORAL_TABLET | Freq: Two times a day (BID) | ORAL | 3 refills | Status: DC

## 2018-06-10 NOTE — Patient Instructions (Signed)
Today you have been seen at the Heart failure clinic at [facility]   Medication changes: Increase Entresto 49/51 mg twice a day   You had Lab work done today:  Scheduled Lab work: 06/24/18 at 10:00am   We will call you if your lab work is abnormal.. No news is good news!!   Follow up wth PA 07/22/18 at 12:00  You have the following test scheduled for:  CPX and PYP scan   Do the following things EVERYDAY: 1) Weigh yourself in the morning before breakfast. Write it down and keep it in a log. 2) Take your medicines as prescribed 3) Eat low salt foods-Limit salt (sodium) to 2000 mg per day.  4) Stay as active as you can everyday 5) Limit all fluids for the day to less than 2 liters  Your physician has recommended that you have a cardiopulmonary stress test (CPX). CPX testing is a non-invasive measurement of heart and lung function. It replaces a traditional treadmill stress test. This type of test provides a tremendous amount of information that relates not only to your present condition but also for future outcomes. This test combines measurements of you ventilation, respiratory gas exchange in the lungs, electrocardiogram (EKG), blood pressure and physical response before, during, and following an exercise protocol.

## 2018-06-10 NOTE — Addendum Note (Signed)
Encounter addended by: Jolaine Artist, MD on: 06/10/2018 6:03 PM  Actions taken: Sign clinical note

## 2018-06-10 NOTE — Progress Notes (Signed)
CSW met with patient due to current concerns about paying for utilities and food.  Patient gets [employer] retirement ($536/month) and monthly workers comp benefits ($360/month).  Patient also gets $36/month in SNAP benefits.  Patient reports that some months he can't pay for utilities and has had them turned of recently for inability to pay- in these situations he usually calls the company and gets payment plan or will pay on credit then have to pay the credit card off when he gets his check.  CSW provided pt with list of crisis assistance programs to help if he has a bill he can't work around.  Pt also states that he sometimes has issues having enough food in the house.  Patient states that he has list of pantries but has not utilized this yet- reports he has enough food at home for this evening but will look into pantries tomorrow- encouraged patient to call CSW if he is unable to obtain food tomorrow and will not have sufficient food in the home.  Patient reports stable transportation at this time- he is borrowing his step-daughters car- but that he is unsure how much longer he will have access to a car.  Patient has SCAT but sometimes has issues with the cost, CSW also provided information regarding Medicaid transport so patient can get free transport to medical appointments.  Patient also states he can sometimes rely on his step-daughter for other assistance help when needed but she also has limited resources.  Patient currently living in apt with his wife whom he is separated from and who has no income but is awaiting disability determination.  CSW will continue to follow pt in clinic and assist as needed  Jorge Ny, [place] Worker [facility] [phone]

## 2018-06-11 ENCOUNTER — Telehealth (HOSPITAL_COMMUNITY): Payer: Self-pay | Admitting: Pharmacist

## 2018-06-11 NOTE — Telephone Encounter (Signed)
Entresto PA approved by [place] Medicaid through 06/11/19.   Ruta Hinds. Velva Harman, PharmD, BCPS, CPP Clinical Pharmacist Phone: [phone] 06/11/2018 11:42 AM

## 2018-06-15 ENCOUNTER — Encounter: Payer: Self-pay | Admitting: Endocrinology

## 2018-06-15 ENCOUNTER — Ambulatory Visit (INDEPENDENT_AMBULATORY_CARE_PROVIDER_SITE_OTHER): Payer: Medicaid Other | Admitting: Endocrinology

## 2018-06-15 VITALS — BP 130/70 | HR 84 | Ht 66.0 in | Wt 229.4 lb

## 2018-06-15 DIAGNOSIS — E118 Type 2 diabetes mellitus with unspecified complications: Secondary | ICD-10-CM | POA: Diagnosis not present

## 2018-06-15 LAB — POCT GLYCOSYLATED HEMOGLOBIN (HGB A1C): Hemoglobin A1C: 8.3 % — AB (ref 4.0–5.6)

## 2018-06-15 MED ORDER — INSULIN ISOPHANE HUMAN 100 UNIT/ML KWIKPEN: 130.0000 [IU] | PEN_INJECTOR | SUBCUTANEOUS | 11 refills | Status: DC

## 2018-06-15 NOTE — Progress Notes (Signed)
Subjective:    Patient ID: Marco [name], male    DOB: May 05, 1956, 62 y.o.   MRN: [id number]  HPI Pt returns for f/u of diabetes mellitus: DM type: Insulin-requiring type 2 Dx'ed: 7026 Complications: polyneuropathy, CAD, NPDR, and renal failure Therapy: insulin since soon after dx DKA: never Severe hypoglycemia: never Pancreatitis: never Pancreatic imaging: never Other: he chronically takes prednisone, for psoriasis, with no plan for taper; he declined to continue multiple daily injections Interval history: no cbg record, but states cbg's vary from 67-140 fasting.  pt states he feels well in general. Past Medical History:  Diagnosis Date  . Benign colon polyp 08/01/2013   [facility] in [place]. large base tranverse colon polyp was biopsied. polyp was benign with minimal surface hyperplastic change.  . Chronic combined systolic and diastolic CHF (congestive heart failure) ([place])   . Chronic pain   . CKD (chronic kidney disease), stage III ([place])   . Diabetes mellitus without complication ([place])   . Diverticulosis   . Esophageal hiatal hernia 07/29/2013   confirmed on EGD   . Esophageal stricture   . Essential hypertension   . Gastritis 07/29/2013   confirmed on EGD, bx done an negative for intestinal metaplasia, dsyplasia or H. pylori. normal gastric emptying study done 07/13/2013.  [name] GERD (gastroesophageal reflux disease)   . Morbid obesity ([place])   . Non-obstructive CAD    a. 02/2013 Cath ([place]): nonobs dzs;  b. 09/2014 Myoview ([place]): EF 55%, no ischemia;  c. Cath 05/2018 mild CAD no obstruction.  . Persistent atrial fibrillation    a. 02/2013 s/p rfca in [place], [place]-->prev on Xarelto, d/c'd 2/2 anemia, ? GIB.  [name] Rheumatoid arthritis ([place])   . Sigmoid diverticulosis 08/01/2013   confirmed on colonscopy. record scanned into chart    Past Surgical History:  Procedure Laterality Date  . CARDIAC CATHETERIZATION  05/2014   ablation for atrial fibrillation  . CARDIAC  CATHETERIZATION N/A 11/16/2015   Procedure: Left Heart Cath and Coronary Angiography;  Surgeon: Leonie Man, MD;  Location: [facility];  Service: Cardiovascular;  Laterality: N/A;  . COLON RESECTION  09/2013   due to large, abnormal polpy. non cancerous per patient.   . COLON SURGERY  09/2014   colon resection   . LEFT HEART CATH AND CORONARY ANGIOGRAPHY N/A 05/19/2018   Procedure: LEFT HEART CATH AND CORONARY ANGIOGRAPHY;  Surgeon: Wellington Hampshire, MD;  Location: [facility];  Service: Cardiovascular;  Laterality: N/A;    Social History   Socioeconomic History  . Marital status: Married    Spouse name: Amethyst  . Number of children: 10  . Years of education: 36  . Highest education level: Not on file  Occupational History    Comment: disabled  Social Needs  . Financial resource strain: Somewhat hard  . Food insecurity:    Worry: Sometimes true    Inability: Sometimes true  . Transportation needs:    Medical: No    Non-medical: No  Tobacco Use  . Smoking status: Former Smoker    Packs/day: 0.50    Years: 10.00    Pack years: 5.00    Types: Cigarettes    Last attempt to quit: 04/11/2008    Years since quitting: 10.1  . Smokeless tobacco: Never Used  Substance and Sexual Activity  . Alcohol use: No    Alcohol/week: 0.0 standard drinks  . Drug use: No  . Sexual activity: Yes    Partners:  Female    Birth control/protection: None  Lifestyle  . Physical activity:    Days per week: Not on file    Minutes per session: Not on file  . Stress: Not on file  Relationships  . Social connections:    Talks on phone: Not on file    Gets together: Not on file    Attends religious service: Not on file    Active member of club or organization: Not on file    Attends meetings of clubs or organizations: Not on file    Relationship status: Not on file  . Intimate partner violence:    Fear of current or ex partner: Not on file    Emotionally abused: Not on file     Physically abused: Not on file    Forced sexual activity: Not on file  Other Topics Concern  . Not on file  Social History Narrative   Lives with wife. Does not work.  On disability.    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 Provided crisis assistance program information to assist with bills       Has issues getting food- gets $36/month in food stamps- already has list of food pantries but has not tried them- encouraged pt to try food pantries and reach out to clinic for help if needed    Current Outpatient Medications on File Prior to Visit  Medication Sig Dispense Refill  . ACCU-CHEK FASTCLIX LANCETS MISC USE AS DIRECTED TO  CHECK  BLOOD  SUGAR  3  TIMES  DAILY 102 each 11  . albuterol (PROVENTIL HFA;VENTOLIN HFA) 108 (90 Base) MCG/ACT inhaler Inhale 2 puffs into the lungs every 6 (six) hours as needed for wheezing or shortness of breath. 1 Inhaler 0  . allopurinol (ZYLOPRIM) 100 MG tablet Take 100 mg by mouth daily.    [name] aspirin (EQ ASPIRIN ADULT LOW DOSE) 81 MG EC tablet Take 1 tablet (81 mg total) by mouth daily. Swallow whole. 90 tablet 3  . atorvastatin (LIPITOR) 40 MG tablet TAKE 1 TABLET BY MOUTH ONCE DAILY 90 tablet 0  . BD PEN NEEDLE NANO U/F 32G X 4 MM MISC USE ONE  THREE TIMES DAILY 100 each 11  . Blood Pressure KIT 1 each by Does not apply route 2 (two) times daily. 1 each 0  . carvedilol (COREG) 3.125 MG tablet Take 1 tablet (3.125 mg total) by mouth 2 (two) times daily with a meal. 60 tablet 1  . DULoxetine (CYMBALTA) 20 MG capsule Take 20 mg by mouth daily.    . furosemide (LASIX) 40 MG tablet Take 1 tablet (40 mg total) by mouth daily. Pt said that he is taking '40mg'$  once day 30 tablet 3  . glucose blood (ACCU-CHEK AVIVA PLUS) test strip 1 each by Other route 2 (two) times daily. And lancets  2/day 200 each 3  . hydrALAZINE (APRESOLINE) 10 MG tablet Take 1 tablet (10 mg total) by mouth every 8 (eight) hours. 90 tablet 1  . HYDROcodone-acetaminophen (NORCO) 7.5-325 MG tablet Take 1 tablet by mouth 3 (three) times daily.  0  . isosorbide mononitrate (IMDUR) 30 MG 24 hr tablet Take 1 tablet (30 mg total) by mouth daily. 90 tablet 3  . potassium chloride 20 MEQ TBCR Take 20 mEq  by mouth daily. 90 tablet 3  . predniSONE (DELTASONE) 10 MG tablet Take 1 tablet (10 mg total) by mouth daily. 30 tablet 1  . pregabalin (LYRICA) 100 MG capsule Take 1 capsule (100 mg total) by mouth 3 (three) times daily. 90 capsule 3  . ranitidine (ZANTAC) 150 MG tablet Take 1 tablet (150 mg total) by mouth daily before breakfast. 90 tablet 1  . sacubitril-valsartan (ENTRESTO) 49-51 MG Take 1 tablet by mouth 2 (two) times daily. 60 tablet 3  . Secukinumab (COSENTYX) 150 MG/ML SOSY Inject 150 mg into the skin every 28 (twenty-eight) days.     [name] zolpidem (AMBIEN CR) 6.25 MG CR tablet Take 1 tablet (6.25 mg total) by mouth at bedtime as needed for sleep. Each prescription to last 1 month. 30 tablet 3   No current facility-administered medications on file prior to visit.     No Known Allergies  Family History  Problem Relation Age of Onset  . Hypertension Mother   . Diabetes Mother   . COPD Mother   . Lung cancer Mother        Smoker   . Hypertension Father   . Diabetes Father   . Heart Problems Father   . COPD Father   . Colon cancer Neg Hx   . Stomach cancer Neg Hx     BP 130/70 (BP Location: Right Arm, Patient Position: Sitting, Cuff Size: Large)   Pulse 84   Ht '5\' 6"'$  (1.676 m)   Wt 229 lb 6.4 oz (104.1 kg)   SpO2 90%   BMI 37.03 kg/m   Review of Systems He denies LOC.      Objective:   Physical Exam VITAL SIGNS:  See vs page GENERAL: no distress Pulses: dorsalis pedis intact bilat.   MSK: no deformity of the feet CV: no leg edema Skin:  no ulcer on the feet.  normal color and temp on  the feet. Neuro: sensation is intact to touch on the feet, but decreased from normal Ext: There is bilateral onychomycosis of the toenails   Lab Results  Component Value Date   CREATININE 1.30 (H) 06/10/2018   BUN 16 06/10/2018   NA 141 06/10/2018   K 3.4 (L) 06/10/2018   CL 107 06/10/2018   CO2 28 06/10/2018    Lab Results  Component Value Date   HGBA1C 8.3 (A) 06/15/2018       Assessment & Plan:  Insulin-requiring type 2 DM, with DR: he needs increased rx Hypoglycemia: in view of CAD: he should avoid this.  Renal insuff: he needs a faster-acting qd insulin.   Patient Instructions  check your blood sugar twice a day.  vary the time of day when you check, between before the 3 meals, and at bedtime.  also check if you have symptoms of your blood sugar being too high or too low.  please keep a record of the readings and bring it to your next appointment here (or you can bring the meter itself).  You can write it on any piece of paper.  please call us sooner if your blood sugar goes below 70, or if you have a lot of readings over 200.   please change the levemir  to "NPH," 130 units each morning.  I have sent a prescription to your pharmacy.   On this type of insulin schedule, you should eat meals on a regular schedule (especially lunch).  If a meal is missed or significantly delayed, your blood sugar  could go low.  Please come back for a follow-up appointment in 2 months.

## 2018-06-15 NOTE — Patient Instructions (Addendum)
check your blood sugar twice a day.  vary the time of day when you check, between before the 3 meals, and at bedtime.  also check if you have symptoms of your blood sugar being too high or too low.  please keep a record of the readings and bring it to your next appointment here (or you can bring the meter itself).  You can write it on any piece of paper.  please call us sooner if your blood sugar goes below 70, or if you have a lot of readings over 200.   please change the levemir  to "NPH," 130 units each morning.  I have sent a prescription to your pharmacy.   On this type of insulin schedule, you should eat meals on a regular schedule (especially lunch).  If a meal is missed or significantly delayed, your blood sugar could go low.  Please come back for a follow-up appointment in 2 months.

## 2018-06-15 NOTE — Telephone Encounter (Signed)
Left message for the patient to call back and his weights.

## 2018-06-16 ENCOUNTER — Telehealth (HOSPITAL_COMMUNITY): Payer: Self-pay | Admitting: Vascular Surgery

## 2018-06-16 NOTE — Telephone Encounter (Signed)
Left pt message giving PYP scan appt, 06/28/18 @ 9 arrival @ 8:45AM, ASKED PT O CALL BACK TO CONFIRM APPT

## 2018-06-17 NOTE — Telephone Encounter (Signed)
Follow up    Patient is returning call. He states that Marco [name] was suppose to be checking on getting him a scale. Please call.

## 2018-06-17 NOTE — Telephone Encounter (Signed)
Called patient about his message. Informed patient that Suezanne Jacquet is out of the office the rest of this week and next, but he would get back with him when he is back in the office. Patient verbalized understanding and thanked me for the call.

## 2018-06-21 ENCOUNTER — Telehealth: Payer: Self-pay | Admitting: Endocrinology

## 2018-06-21 NOTE — Telephone Encounter (Signed)
Patient called stating insurance will not cover Insulin NPH, Human,, Isophane, (HUMULIN N KWIKPEN) 100 UNIT/ML Kiwkpen. Pharmacy sent over a fax to over ride RX. Patient says this has not been done. Please Advise, thanks

## 2018-06-21 NOTE — Telephone Encounter (Signed)
We need to do override, thanks.

## 2018-06-21 NOTE — Telephone Encounter (Signed)
Please complete PA

## 2018-06-21 NOTE — Telephone Encounter (Signed)
Called pt pharmacy to request clarification of pt message re: "override". States they did not fax anything to our office, just informed pt that the medication will require PA with ins. Do you want to change the medication or complete the PA? Please advise

## 2018-06-22 ENCOUNTER — Telehealth: Payer: Self-pay | Admitting: Endocrinology

## 2018-06-22 MED ORDER — INSULIN NPH (HUMAN) (ISOPHANE) 100 UNIT/ML ~~LOC~~ SUSP: 130.0000 [IU] | SUBCUTANEOUS | 11 refills | Status: DC

## 2018-06-22 NOTE — Telephone Encounter (Signed)
Response received stating insurance has denied PA. Placed on Dr. Cordelia Pen desk for alternative medication or if appeal is required.

## 2018-06-22 NOTE — Telephone Encounter (Signed)
PA initiated today. Will await response from insurance.

## 2018-06-22 NOTE — Telephone Encounter (Signed)
Pt stated that he is willing to try the vials, he asked if you could only send a 30 day supply at this time in case he does not like it. Pt also wanted me to inform you that his blood sugar have been low in the mornings recently,54,59,60 and 81 was this morning. He stated that they have been going up to over 300, please advise.

## 2018-06-22 NOTE — Telephone Encounter (Signed)
please call patient: Ins declined NPH pen.  Ok to send rx for this insulin in the vial?

## 2018-06-22 NOTE — Telephone Encounter (Signed)
This has been addressed in a separate encounter

## 2018-06-22 NOTE — Telephone Encounter (Signed)
Ok, I have sent a prescription to your pharmacy.  Please call or message [place] next week, to tell us how the blood sugar is doing

## 2018-06-23 ENCOUNTER — Other Ambulatory Visit: Payer: Self-pay

## 2018-06-23 DIAGNOSIS — E119 Type 2 diabetes mellitus without complications: Secondary | ICD-10-CM

## 2018-06-23 MED ORDER — INSULIN PEN NEEDLE 32G X 4 MM MISC: 11 refills | Status: DC

## 2018-06-23 NOTE — Telephone Encounter (Signed)
Called pt and left detailed VM re: Rx and to call early next week with update on CBG's.

## 2018-06-23 NOTE — Telephone Encounter (Signed)
Received notification from [facility] that pt is requesting refill of pen needled. Rx sent today

## 2018-06-23 NOTE — Telephone Encounter (Signed)
Please call pt

## 2018-06-24 ENCOUNTER — Ambulatory Visit (HOSPITAL_COMMUNITY)
Admission: RE | Admit: 2018-06-24 | Discharge: 2018-06-24 | Disposition: A | Discharge status: Home / Self Care | Payer: Medicaid Other | Source: Ambulatory Visit | Attending: Cardiology | Admitting: Cardiology

## 2018-06-24 DIAGNOSIS — I5042 Chronic combined systolic (congestive) and diastolic (congestive) heart failure: Secondary | ICD-10-CM | POA: Insufficient documentation

## 2018-06-24 LAB — BASIC METABOLIC PANEL
ANION GAP: 9 (ref 5–15)
BUN: 23 mg/dL (ref 8–23)
CALCIUM: 8.3 mg/dL — AB (ref 8.9–10.3)
CO2: 25 mmol/L (ref 22–32)
Chloride: 107 mmol/L (ref 98–111)
Creatinine, Ser: 1.55 mg/dL — ABNORMAL HIGH (ref 0.61–1.24)
GFR calc Af Amer: 53 mL/min — ABNORMAL LOW (ref >60)
GFR, EST NON AFRICAN AMERICAN: 46 mL/min — AB (ref >60)
Glucose, Bld: 241 mg/dL — ABNORMAL HIGH (ref 70–99)
POTASSIUM: 3.5 mmol/L (ref 3.5–5.1)
SODIUM: 141 mmol/L (ref 135–145)

## 2018-06-28 ENCOUNTER — Encounter (HOSPITAL_COMMUNITY): Payer: Medicaid Other

## 2018-06-28 ENCOUNTER — Ambulatory Visit (HOSPITAL_COMMUNITY): Admission: RE | Admit: 2018-06-28 | Payer: Medicaid Other | Source: Ambulatory Visit

## 2018-06-29 ENCOUNTER — Telehealth (HOSPITAL_COMMUNITY): Payer: Self-pay

## 2018-06-29 ENCOUNTER — Encounter (HOSPITAL_COMMUNITY)
Admission: RE | Admit: 2018-06-29 | Discharge: 2018-06-29 | Disposition: A | Discharge status: Home / Self Care | Payer: Medicaid Other | Source: Ambulatory Visit | Attending: Student | Admitting: Student

## 2018-06-29 DIAGNOSIS — I5042 Chronic combined systolic (congestive) and diastolic (congestive) heart failure: Secondary | ICD-10-CM

## 2018-06-29 MED ORDER — TECHNETIUM TC 99M PYROPHOSPHATE
20.0000 | Freq: Once | INTRAVENOUS | Status: AC
  Administered 2018-06-29: 20 via INTRAVENOUS
  Filled 2018-06-29: qty 20

## 2018-06-29 NOTE — Telephone Encounter (Signed)
Please find out what his weight is today

## 2018-06-29 NOTE — Telephone Encounter (Signed)
Spoke with the patient, he could not find a scale. He also stated he has been having more exertional angina and SOB. He said it has been worse since the past week. His normal weight is around 226 lbs. Advised the patient get an updated weight. He denied any swelling.   Sending to [name] for recommendations.

## 2018-06-29 NOTE — Telephone Encounter (Signed)
Pt called with no answer voice mail left for pt to call back  

## 2018-06-30 ENCOUNTER — Telehealth: Payer: Self-pay | Admitting: Endocrinology

## 2018-06-30 ENCOUNTER — Other Ambulatory Visit: Payer: Self-pay | Admitting: Endocrinology

## 2018-06-30 NOTE — Telephone Encounter (Signed)
Patient is calling in regards to a prior auth for an RX. Patient could not provide me a name, just stated its not a pen. Please Advise, thanks

## 2018-06-30 NOTE — Telephone Encounter (Signed)
Called pt to inform that we have not received a response from ins co re: status of PA. Can take up to 10 business days according to website. Pt has been advised to call ins co for status update which may or may not expedite process.

## 2018-06-30 NOTE — Telephone Encounter (Signed)
This encounter has been closed as duplicate. Addressed in previously created encounter.

## 2018-06-30 NOTE — Telephone Encounter (Signed)
Spoke with the patient, he is coming to the office to get a scale and a weight, per [name].

## 2018-06-30 NOTE — Telephone Encounter (Signed)
please call patient: I go PA form for the NPH insulin.  You can get the NPH insulin, but only to draw up from the vial.  OK with you for me to send this rx?

## 2018-06-30 NOTE — Telephone Encounter (Signed)
From [name]: please call patient: I go PA form for the NPH insulin.  You can get the NPH insulin, but only to draw up from the vial.  OK with you for me to send this rx?  Called pt to inform that ins will authorize vials rather than pens. Pt is agreeable to have a Rx sent for the vials insulin. Message has been sent to [name] to d/c pen and send for vial.

## 2018-07-01 NOTE — Telephone Encounter (Signed)
Patient got his scale, today on our scale he weighed 226 lb 12oz. He stated he is still having the SOB on exertion and chest tightness. He requested to have an appointment to explore these symptoms.  Sending to [name].

## 2018-07-02 ENCOUNTER — Other Ambulatory Visit: Payer: Self-pay | Admitting: Endocrinology

## 2018-07-02 ENCOUNTER — Other Ambulatory Visit: Payer: Self-pay

## 2018-07-02 ENCOUNTER — Telehealth (HOSPITAL_COMMUNITY): Payer: Self-pay

## 2018-07-02 MED ORDER — INSULIN NPH (HUMAN) (ISOPHANE) 100 UNIT/ML ~~LOC~~ SUSP: 130.0000 [IU] | SUBCUTANEOUS | 11 refills | Status: DC

## 2018-07-02 NOTE — Telephone Encounter (Signed)
Next step is to see if medicaid prefers humulin N in vial.  If not buy 1 vial of NPh for the weekend.

## 2018-07-02 NOTE — Telephone Encounter (Signed)
He appears to be 3lbs above his weight in August 2019.  Please increase Lasix to 40mg  BID and reevaluate with extender on Monday.

## 2018-07-02 NOTE — Telephone Encounter (Signed)
Pt called with no answer voice mail left for pt to call back  

## 2018-07-02 NOTE — Telephone Encounter (Signed)
Called pt to make him aware. He was on the phone with the pharmacy at the time of my call. States a PA is also required for NPH. Wants to know what he can take to get through the weekend and until PA is completed/authorized. Please advise

## 2018-07-02 NOTE — Telephone Encounter (Signed)
Received call from pt stating he received call from pharmacy indication PA was now required for Novolin N vials. Per orders of [name], Rx sent for Humulin N same dosage with message indication if PA required, pt can purchase OTC until PA can be completed and authorized by ins. Called pt to make him aware. LVM requesting returned call.

## 2018-07-02 NOTE — Telephone Encounter (Signed)
LMTCB

## 2018-07-02 NOTE — Telephone Encounter (Signed)
It was sent last week, but I have resent

## 2018-07-05 MED ORDER — FUROSEMIDE 40 MG PO TABS: 40.0000 mg | ORAL_TABLET | Freq: Two times a day (BID) | ORAL | 3 refills | Status: DC

## 2018-07-05 NOTE — Telephone Encounter (Signed)
Spoke with the patient about Dr. Theodosia Blender recommendations, he accepted taking lasix 40 mg, BID. He is scheduled for [name], [name] on 11/26, to review his worsening SOB and chest tightness.

## 2018-07-05 NOTE — Telephone Encounter (Signed)
Left detailed message to call back.

## 2018-07-06 ENCOUNTER — Ambulatory Visit (HOSPITAL_COMMUNITY)
Admission: RE | Admit: 2018-07-06 | Discharge: 2018-07-06 | Disposition: A | Discharge status: Home / Self Care | Payer: Medicaid Other | Source: Ambulatory Visit | Attending: Internal Medicine | Admitting: Internal Medicine

## 2018-07-06 ENCOUNTER — Encounter: Payer: Self-pay | Admitting: Physician Assistant

## 2018-07-06 ENCOUNTER — Ambulatory Visit (INDEPENDENT_AMBULATORY_CARE_PROVIDER_SITE_OTHER): Payer: Medicaid Other | Admitting: Physician Assistant

## 2018-07-06 VITALS — BP 140/80 | HR 77 | Ht 66.0 in | Wt 230.8 lb

## 2018-07-06 DIAGNOSIS — I428 Other cardiomyopathies: Secondary | ICD-10-CM | POA: Insufficient documentation

## 2018-07-06 DIAGNOSIS — I10 Essential (primary) hypertension: Secondary | ICD-10-CM

## 2018-07-06 DIAGNOSIS — N183 Chronic kidney disease, stage 3 unspecified: Secondary | ICD-10-CM

## 2018-07-06 DIAGNOSIS — I4819 Other persistent atrial fibrillation: Secondary | ICD-10-CM | POA: Diagnosis not present

## 2018-07-06 DIAGNOSIS — I5042 Chronic combined systolic (congestive) and diastolic (congestive) heart failure: Secondary | ICD-10-CM

## 2018-07-06 DIAGNOSIS — E854 Organ-limited amyloidosis: Secondary | ICD-10-CM | POA: Insufficient documentation

## 2018-07-06 MED ORDER — SACUBITRIL-VALSARTAN 49-51 MG PO TABS: 1.0000 | ORAL_TABLET | Freq: Two times a day (BID) | ORAL | Status: DC

## 2018-07-06 MED ORDER — SACUBITRIL-VALSARTAN 97-103 MG PO TABS: 1.0000 | ORAL_TABLET | Freq: Two times a day (BID) | ORAL | Status: DC

## 2018-07-06 NOTE — Patient Instructions (Signed)
Medication Instructions:  Your physician has recommended you make the following change in your medication:  1. INCREASE ENTRESTRO TO 97/103 MG TWICE DAILY.  If you need a refill on your cardiac medications before your next appointment, please call your pharmacy.   Lab work: 1.TODAY: BMET, BNP  2. 1 WEEK: BMET  If you have labs (blood work) drawn today and your tests are completely normal, you will receive your results only by: [name] MyChart Message (if you have MyChart) OR . A paper copy in the mail If you have any lab test that is abnormal or we need to change your treatment, we will call you to review the results.  Testing/Procedures: NONE  Follow-Up: . PLEASE KEEP APPOINTMENT WITH HEART FAILURE [facility] 07/22/18.  Any Other Special Instructions Will Be Listed Below (If Applicable).

## 2018-07-06 NOTE — Addendum Note (Signed)
Addended by: Jacinta Shoe on: 07/06/2018 02:14 PM   Modules accepted: Orders

## 2018-07-06 NOTE — Progress Notes (Signed)
Cardiology Office Note:    Date:  07/06/2018   ID:  Marco [name], DOB 1955/08/12, MRN [id number]  PCP:  [name], MD  Cardiologist:  [name], MD   Electrophysiologist:  None  Advanced Heart Failure Clinic:  Glori Bickers, MD     Referring MD: [name], MD   Chief Complaint  Patient presents with  . Shortness of Breath     History of Present Illness:    Marco [name] is a 62 y.o. male with  combined systolic and diastolic heart failure, nonischemic cardiomyopathy, mild nonobstructive coronary artery disease by cardiac catheterization in 2017, persistent atrial fibrillation with prior history of ablation in [place] in 2017, diabetes, hypertension, chronic kidney disease, sleep apnea, obesity.  His EF was 45-50 but improved to normal by Echo in 01/2017. CHADS2-VASc=4 (CHF, DM, CAD, HTN).  Anticoagulation has been discontinued in the past secondary to anemia.  He has a history of chronic dyspnea and chronic chest discomfort.  Evaluation by pulmonology included a cardiopulmonary stress test in 2017.  However, this was not conclusive due to submaximal effort.    He was seen in August 2019 for worsening shortness of breath.  An echocardiogram in September 2019 demonstrated worsening LV function with an EF of 35-40%.  Coronary CTA was planned.  However, the patient was admitted in October 2019 with chest pain and mildly elevated troponin.  Echocardiogram demonstrated EF 25-30%.  Cardiac catheterization demonstrated mild plaque without obstructive disease.  CHF medications were adjusted for nonischemic cardiomyopathy.  He was evaluated by [name] in the advanced heart failure clinic in October 2019.  PYP scan was strongly suggestive of transthyretin amyloidosis.  Genetic testing has been arranged and he has follow-up in the heart failure clinic in 2 weeks.    Marco [name] recently called in with complaints of worsening shortness of breath.  [name] had him increase  his Lasix to 40 mg twice daily and return today for evaluation.  His shortness of breath has not really improved that much since increasing his Lasix.  He denies orthopnea or paroxysmal nocturnal dyspnea.  He denies lower extremity swelling.  He does note shortness of breath with mild to moderate activities.  He also notes chest discomfort with any type of exertion.  Overall, his chest discomfort is not getting any worse.  He has not really noted any significant weight gain at home.  He denies syncope.  Prior CV studies:   The following studies were reviewed today:  PYP Scan 06/29/2018 IMPRESSION: Visual and quantitative assessment (grade 2, H/CLL equal 1.94) are strongly suggestive of transthyretin amyloidosis.  Echocardiogram 05/19/2018 Moderate concentric LVH, EF 25-30, diffuse HK, grade 1 diastolic dysfunction, trivial MR  Cardiac catheterization 05/19/2018 LAD proximal 20; D2 35 LCx ostial 10 RCA minimal luminal irregularities EF 25-35 1.  Mild nonobstructive coronary artery disease. 2.  Severely reduced LV systolic function with an EF of 25 to 30% with severe global hypokinesis. 3.  Moderately to severely elevated left ventricular end-diastolic pressure at 28 mmHg.  Echocardiogram 04/21/2018 Severe LVH, EF 35-40, severe inferior hypokinesis, grade 1 diastolic dysfunction   Exercise tolerance test 02/12/2017  Pt walked on the treadmill for 20 seconds.  The treadmill was stopped due to severe shortness of breath .  There were no ST or T wave changes to suggest ischemia  Non diagnostic GXT due to inability to achieve target HR .   Echo 01/20/2017 Severe concentric LVH, EF 50-55, apical inferoseptal hypokinesis, grade 1  diastolic dysfunction, mild MR   Event monitor 01/01/2017  Normal sinus with average heart rate 67bpm  Occsional PVCs  Syncopal episode with no EKG changes during episode   Nuclear stress test 01/01/2017 Low risk stress nuclear study with small prior distal  septal/apical infarct; no ischemia; EF 44 with global hypokinesis and mild LVE.   Echo 09/03/2016. Mild LVH, EF 45-50, diffuse HK, grade 2 diastolic dysfunction, mild RAE   Cardiopulmonary stress test 06/19/2016 Conclusion: The interpretation of this test is limited due to submaximal effort during the exercise. Test is extremely difficult to interpret given amount of information collected in such short time in the setting of submaximal exercise. However, it can be noted that VE/VCO2 (a submaximal parameter) and resting spirometry were normal. Patient was very intolerant to discomfort from several processes during the CPX procedure today (i.e. Motion on bike, electrode removal, etc).    Cardiac catheterization 11/16/2015 LAD proximal 20; D2 ostial 35 LCx ostial 10 RCA irregularities EF 55-65 1. Angiographically minimal CAD. Mild calcification noted extraluminal. 2. The left ventricular systolic function is normal. Normal LVEDP   Nuclear stress test 10/11/2015 EF 46, Low risk stress nuclear study with a small area of basal inferolateral very mild ischemia. Mildly reduced left ventricular global systolic function.    Past Medical History:  Diagnosis Date  . Benign colon polyp 08/01/2013   [facility] in [place]. large base tranverse colon polyp was biopsied. polyp was benign with minimal surface hyperplastic change.  . Chronic combined systolic and diastolic CHF (congestive heart failure) ([place])   . Chronic pain   . CKD (chronic kidney disease), stage III ([place])   . Diabetes mellitus without complication ([place])   . Diverticulosis   . Esophageal hiatal hernia 07/29/2013   confirmed on EGD   . Esophageal stricture   . Essential hypertension   . Gastritis 07/29/2013   confirmed on EGD, bx done an negative for intestinal metaplasia, dsyplasia or H. pylori. normal gastric emptying study done 07/13/2013.  [name] GERD (gastroesophageal reflux disease)   . Morbid obesity ([place])   .  Non-obstructive CAD    a. 02/2013 Cath ([place]): nonobs dzs;  b. 09/2014 Myoview ([place]): EF 55%, no ischemia;  c. Cath 05/2018 mild CAD no obstruction.  . Persistent atrial fibrillation    a. 02/2013 s/p rfca in [place], [place]-->prev on Xarelto, d/c'd 2/2 anemia, ? GIB.  [name] Rheumatoid arthritis ([place])   . Sigmoid diverticulosis 08/01/2013   confirmed on colonscopy. record scanned into chart   Surgical Hx: The patient  has a past surgical history that includes Colon surgery (09/2014); Cardiac catheterization (05/2014); Colon resection (09/2013); Cardiac catheterization (N/A, 11/16/2015); and LEFT HEART CATH AND CORONARY ANGIOGRAPHY (N/A, 05/19/2018).   Current Medications: Current Meds  Medication Sig  . ACCU-CHEK FASTCLIX LANCETS MISC USE AS DIRECTED TO  CHECK  BLOOD  SUGAR  3  TIMES  DAILY  . albuterol (PROVENTIL HFA;VENTOLIN HFA) 108 (90 Base) MCG/ACT inhaler Inhale 2 puffs into the lungs every 6 (six) hours as needed for wheezing or shortness of breath.  . allopurinol (ZYLOPRIM) 100 MG tablet Take 100 mg by mouth daily.  [name] aspirin (EQ ASPIRIN ADULT LOW DOSE) 81 MG EC tablet Take 1 tablet (81 mg total) by mouth daily. Swallow whole.  [name] atorvastatin (LIPITOR) 40 MG tablet TAKE 1 TABLET BY MOUTH ONCE DAILY  . Blood Pressure KIT 1 each by Does not apply route 2 (two) times daily.  . carvedilol (COREG) 3.125 MG tablet  Take 1 tablet (3.125 mg total) by mouth 2 (two) times daily with a meal.  . DULoxetine (CYMBALTA) 20 MG capsule Take 20 mg by mouth daily.  . furosemide (LASIX) 40 MG tablet Take 1 tablet (40 mg total) by mouth 2 (two) times daily.  [name] glucose blood (ACCU-CHEK AVIVA PLUS) test strip 1 each by Other route 2 (two) times daily. And lancets 2/day  . hydrALAZINE (APRESOLINE) 10 MG tablet Take 1 tablet (10 mg total) by mouth every 8 (eight) hours.  [name] HYDROcodone-acetaminophen (NORCO) 7.5-325 MG tablet Take 1 tablet by mouth 3 (three) times daily.  . insulin NPH Human (HUMULIN N) 100 UNIT/ML  injection Inject 1.3 mLs (130 Units total) into the skin every morning.  . Insulin Pen Needle (BD PEN NEEDLE NANO U/F) 32G X 4 MM MISC Use to inject Novolin insulin every morning  . isosorbide mononitrate (IMDUR) 30 MG 24 hr tablet Take 1 tablet (30 mg total) by mouth daily.  . potassium chloride 20 MEQ TBCR Take 20 mEq by mouth daily.  . predniSONE (DELTASONE) 10 MG tablet Take 1 tablet (10 mg total) by mouth daily.  . pregabalin (LYRICA) 100 MG capsule Take 1 capsule (100 mg total) by mouth 3 (three) times daily.  . ranitidine (ZANTAC) 150 MG tablet Take 1 tablet (150 mg total) by mouth daily before breakfast.  . Secukinumab (COSENTYX) 150 MG/ML SOSY Inject 150 mg into the skin every 28 (twenty-eight) days.   [name] zolpidem (AMBIEN CR) 6.25 MG CR tablet Take 1 tablet (6.25 mg total) by mouth at bedtime as needed for sleep. Each prescription to last 1 month.  . [DISCONTINUED] sacubitril-valsartan (ENTRESTO) 49-51 MG Take 1 tablet by mouth 2 (two) times daily.   Current Facility-Administered Medications for the 07/06/18 encounter (Office Visit) with [name] T, [name]  Medication  . sacubitril-valsartan (ENTRESTO) 49-51 mg per tablet     Allergies:   Patient has no known allergies.   Social History   Tobacco Use  . Smoking status: Former Smoker    Packs/day: 0.50    Years: 10.00    Pack years: 5.00    Types: Cigarettes    Last attempt to quit: 04/11/2008    Years since quitting: 10.2  . Smokeless tobacco: Never Used  Substance Use Topics  . Alcohol use: No    Alcohol/week: 0.0 standard drinks  . Drug use: No     Family Hx: The patient's family history includes COPD in his father and mother; Diabetes in his father and mother; Heart Problems in his father; Hypertension in his father and mother; Lung cancer in his mother. There is no history of Colon cancer or Stomach cancer.  ROS:   Please see the history of present illness.    Review of Systems  Constitution: Positive for  malaise/fatigue.  Cardiovascular: Positive for chest pain.  Respiratory: Positive for shortness of breath.   Musculoskeletal: Positive for back pain and myalgias.  Gastrointestinal: Positive for abdominal pain.   All other systems reviewed and are negative.   EKGs/Labs/Other Test Reviewed:    EKG:  EKG is  ordered today. The ekg ordered today demonstrates normal sinus rhythm, heart rate 77, normal axis, QTC 450, T wave inversions V4-V6, similar to old EKGs   Recent Labs: 01/29/2018: TSH 1.370 04/21/2018: ALT 23 05/19/2018: Magnesium 2.0 05/20/2018: Hemoglobin 13.8; Platelets 228 05/31/2018: NT-Pro BNP 636 06/10/2018: B Natriuretic Peptide 439.4 06/24/2018: BUN 23; Creatinine, Ser 1.55; Potassium 3.5; Sodium 141   Recent Lipid Panel Lab  Results  Component Value Date/Time   CHOL 222 (H) 04/21/2018 07:26 AM   TRIG 180 (H) 04/21/2018 07:26 AM   HDL 38 (L) 04/21/2018 07:26 AM   CHOLHDL 5.8 (H) 04/21/2018 07:26 AM   CHOLHDL 2.6 06/10/2016 12:53 PM   LDLCALC 148 (H) 04/21/2018 07:26 AM    Physical Exam:    VS:  BP 140/80   Pulse 77   Ht '5\' 6"'$  (1.676 m)   Wt 230 lb 12.8 oz (104.7 kg)   SpO2 97%   BMI 37.25 kg/m     Wt Readings from Last 3 Encounters:  07/06/18 230 lb 12.8 oz (104.7 kg)  06/15/18 229 lb 6.4 oz (104.1 kg)  06/10/18 227 lb (103 kg)     Physical Exam  Constitutional: He is oriented to person, place, and time. He appears well-developed and well-nourished. No distress.  HENT:  Head: Normocephalic and atraumatic.  Eyes: No scleral icterus.  Neck: No JVD present. No thyromegaly present.  Cardiovascular: Normal rate and regular rhythm.  No murmur heard. Pulmonary/Chest: Effort normal. He has no wheezes. He has no rales.  Abdominal: Soft. He exhibits no distension.  Musculoskeletal: He exhibits no edema.  Lymphadenopathy:    He has no cervical adenopathy.  Neurological: He is alert and oriented to person, place, and time.  Skin: Skin is warm and dry.    Psychiatric: He has a normal mood and affect.    ASSESSMENT & PLAN:    Chronic combined systolic and diastolic CHF (congestive heart failure) ([place])  He notes recent worsening shortness of breath.  He also notes chest discomfort with exertion.  This is a fairly chronic symptom for him.  Recent cardiac catheterization demonstrated mild plaque but no significant obstructive disease.  Overall, his chest discomfort has not really gotten any worse.  On exam, he does not really look significantly volume overloaded.  His lungs are clear, his neck veins are flat and he has no lower extremity swelling.  He was recently told to increase his Lasix to twice daily.  He has not really noted significant change with this.  His oxygen saturation on room air is normal.  He is not tachycardic.  Blood pressure is 140/80.  As noted, he has follow-up with the heart failure clinic in 2 weeks.  I reviewed his case today with [name]. I will adjust his Entresto dose and obtain a BNP today.  If his BNP is elevated, I will adjust his Lasix further.  -Check BMET, BNP  -Increase Entresto to 97/103 mg twice daily  -Obtain BMET 1 week  -If BNP significantly elevated, increase Furosemide   -Keep follow-up with CHF clinic 07/22/2018  NICM (nonischemic cardiomyopathy) ([place])  As noted, recent PYP scan positive for transthyretin amyloidosis.  Genetic testing has been ordered and he has follow-up with the CHF clinic 07/22/2018.  Other persistent atrial fibrillation  History of prior ablation in [place].  He is maintaining normal sinus rhythm.  He has been taken off of anticoagulation in the past secondary to anemia.  Essential hypertension  Blood pressure above target.  Adjust Entresto as noted.  Stage 3 chronic kidney disease ([place])  With recent adjustment of furosemide, obtain BMET today.  Repeat BMET 1 week with adjustment of Entresto.  Dispo:  Return in about 16 days (around 07/22/2018) for Scheduled Follow Up w/ CHF  Clinic .   Medication Adjustments/Labs and Tests Ordered: Current medicines are reviewed at length with the patient today.  Concerns regarding medicines are  outlined above.  Tests Ordered: Orders Placed This Encounter  Procedures  . Basic metabolic panel  . Pro b natriuretic peptide (BNP)  . Basic metabolic panel  . EKG 12-Lead   Medication Changes: Meds ordered this encounter  Medications  . sacubitril-valsartan (ENTRESTO) 49-51 mg per tablet    Order Specific Question:   ACE-inhibitors have NOT been administered in the past 36-hours.    Answer:   YES (confirmed by ordering provider)    Signed, [name], [name]  07/06/2018 2:06 PM    [facility] [place], [place], [place]  [address] Phone: [phone]; Fax: [phone]

## 2018-07-07 LAB — PRO B NATRIURETIC PEPTIDE: NT-PRO BNP: 624 pg/mL — AB (ref 0–210)

## 2018-07-07 LAB — BASIC METABOLIC PANEL
BUN / CREAT RATIO: 13 (ref 10–24)
BUN: 18 mg/dL (ref 8–27)
CO2: 26 mmol/L (ref 20–29)
CREATININE: 1.4 mg/dL — AB (ref 0.76–1.27)
Calcium: 9.2 mg/dL (ref 8.6–10.2)
Chloride: 104 mmol/L (ref 96–106)
GFR calc non Af Amer: 53 mL/min/{1.73_m2} — ABNORMAL LOW (ref >59)
GFR, EST AFRICAN AMERICAN: 61 mL/min/{1.73_m2} (ref >59)
GLUCOSE: 90 mg/dL (ref 65–99)
Potassium: 3.8 mmol/L (ref 3.5–5.2)
Sodium: 147 mmol/L — ABNORMAL HIGH (ref 134–144)

## 2018-07-12 ENCOUNTER — Telehealth: Payer: Self-pay | Admitting: Physician Assistant

## 2018-07-12 ENCOUNTER — Other Ambulatory Visit: Payer: Self-pay

## 2018-07-12 ENCOUNTER — Telehealth: Payer: Self-pay | Admitting: Endocrinology

## 2018-07-12 DIAGNOSIS — E118 Type 2 diabetes mellitus with unspecified complications: Secondary | ICD-10-CM

## 2018-07-12 DIAGNOSIS — E119 Type 2 diabetes mellitus without complications: Secondary | ICD-10-CM

## 2018-07-12 LAB — MULTIPLE MYELOMA PANEL, SERUM
ALBUMIN/GLOB SERPL: 1.1 (ref 0.7–1.7)
ALPHA 1: 0.2 g/dL (ref 0.0–0.4)
ALPHA2 GLOB SERPL ELPH-MCNC: 1 g/dL (ref 0.4–1.0)
Albumin SerPl Elph-Mcnc: 3.6 g/dL (ref 2.9–4.4)
B-GLOBULIN SERPL ELPH-MCNC: 1.2 g/dL (ref 0.7–1.3)
GAMMA GLOB SERPL ELPH-MCNC: 1 g/dL (ref 0.4–1.8)
GLOBULIN, TOTAL: 3.3 g/dL (ref 2.2–3.9)
IGG (IMMUNOGLOBIN G), SERUM: 1217 mg/dL (ref 700–1600)
IgA: 321 mg/dL (ref 61–437)
IgM (Immunoglobulin M), Srm: 52 mg/dL (ref 20–172)
M PROTEIN SERPL ELPH-MCNC: 0.5 g/dL — AB
Total Protein ELP: 6.9 g/dL (ref 6.0–8.5)

## 2018-07-12 MED ORDER — INSULIN NPH (HUMAN) (ISOPHANE) 100 UNIT/ML ~~LOC~~ SUSP: 130.0000 [IU] | SUBCUTANEOUS | 11 refills | Status: DC

## 2018-07-12 MED ORDER — INSULIN PEN NEEDLE 32G X 4 MM MISC: 11 refills | Status: DC

## 2018-07-12 NOTE — Telephone Encounter (Signed)
Rx has been sent for 90 day supply. Pen needles also sent.

## 2018-07-12 NOTE — Telephone Encounter (Signed)
-----   Message from Frederik Schmidt, RN sent at 07/12/2018  9:32 AM EST -----   ----- Message ----- From: Liliane Shi, [name] Sent: 07/07/2018   8:19 AM EST To: [facility] Triage  Creatinine stable.  The potassium is normal.  The BNP is not significantly elevated. Recommendations:  - Have Mr. Pettway take Lasix 80 mg in the A and 40 mg in the P for 3 days.    - Take an extra K+ 20 mEq (total of 40 mEq daily) for 3 days.  - After 3 days, resume Lasix 40 mg Twice daily and K+ 20 mEq QD  - Keep follow up with the CHF clinic as planned. [name], [name]    07/07/2018 8:13 AM

## 2018-07-12 NOTE — Telephone Encounter (Signed)
° °  Please return call with lab results 

## 2018-07-12 NOTE — Telephone Encounter (Signed)
insulin NPH Human (HUMULIN N) 100 UNIT/ML injection   Patient stated he received a 7 day prescription that he believes is for the insulin above. He stated he is out of this and wanted to have a 3 month supply sent into the pharmacy       [place], [place]

## 2018-07-12 NOTE — Telephone Encounter (Signed)
Pt called back while I was on the phone with another pt. I returned pt's call and left message to call back.

## 2018-07-13 ENCOUNTER — Other Ambulatory Visit: Payer: Medicaid Other

## 2018-07-13 DIAGNOSIS — N183 Chronic kidney disease, stage 3 unspecified: Secondary | ICD-10-CM

## 2018-07-13 DIAGNOSIS — I10 Essential (primary) hypertension: Secondary | ICD-10-CM

## 2018-07-13 DIAGNOSIS — I4819 Other persistent atrial fibrillation: Secondary | ICD-10-CM

## 2018-07-13 DIAGNOSIS — I428 Other cardiomyopathies: Secondary | ICD-10-CM

## 2018-07-13 DIAGNOSIS — I5042 Chronic combined systolic (congestive) and diastolic (congestive) heart failure: Secondary | ICD-10-CM

## 2018-07-13 LAB — BASIC METABOLIC PANEL
BUN/Creatinine Ratio: 14 (ref 10–24)
BUN: 20 mg/dL (ref 8–27)
CO2: 25 mmol/L (ref 20–29)
Calcium: 8.6 mg/dL (ref 8.6–10.2)
Chloride: 100 mmol/L (ref 96–106)
Creatinine, Ser: 1.47 mg/dL — ABNORMAL HIGH (ref 0.76–1.27)
GFR calc Af Amer: 58 mL/min/{1.73_m2} — ABNORMAL LOW (ref >59)
GFR calc non Af Amer: 50 mL/min/{1.73_m2} — ABNORMAL LOW (ref >59)
Glucose: 254 mg/dL — ABNORMAL HIGH (ref 65–99)
Potassium: 4 mmol/L (ref 3.5–5.2)
Sodium: 141 mmol/L (ref 134–144)

## 2018-07-13 MED ORDER — POTASSIUM CHLORIDE ER 20 MEQ PO TBCR: 20.0000 meq | EXTENDED_RELEASE_TABLET | Freq: Every day | ORAL | 3 refills | Status: DC

## 2018-07-13 MED ORDER — FUROSEMIDE 40 MG PO TABS: 40.0000 mg | ORAL_TABLET | Freq: Two times a day (BID) | ORAL | 3 refills | Status: DC

## 2018-07-13 NOTE — Telephone Encounter (Signed)
Routed to [place], [place] CMA Jacinta Shoe to reach out to the pt.

## 2018-07-13 NOTE — Telephone Encounter (Signed)
Pt came in for his lab work today. I s/w pt and went over his lab results and recommendations with the pt today while he was in the office. Pt gave verbal understanding to medication recommendations. Pt thanked me for my help. Pt requested to have refills sent in for lasix and potassium to [facility].

## 2018-07-13 NOTE — Addendum Note (Signed)
Addended by: Michae Kava on: 07/13/2018 11:51 AM   Modules accepted: Orders

## 2018-07-19 ENCOUNTER — Telehealth: Payer: Self-pay

## 2018-07-19 ENCOUNTER — Ambulatory Visit (HOSPITAL_COMMUNITY)
Admission: RE | Admit: 2018-07-19 | Discharge: 2018-07-19 | Disposition: A | Discharge status: Home / Self Care | Payer: Medicaid Other | Source: Ambulatory Visit | Attending: Internal Medicine | Admitting: Internal Medicine

## 2018-07-19 ENCOUNTER — Encounter: Payer: Self-pay | Admitting: Internal Medicine

## 2018-07-19 ENCOUNTER — Ambulatory Visit (HOSPITAL_BASED_OUTPATIENT_CLINIC_OR_DEPARTMENT_OTHER): Payer: Medicaid Other | Admitting: Internal Medicine

## 2018-07-19 VITALS — BP 127/73 | HR 82 | Temp 98.8°F | Resp 16 | Wt 232.0 lb

## 2018-07-19 DIAGNOSIS — I4891 Unspecified atrial fibrillation: Secondary | ICD-10-CM

## 2018-07-19 DIAGNOSIS — Z79899 Other long term (current) drug therapy: Secondary | ICD-10-CM

## 2018-07-19 DIAGNOSIS — G4733 Obstructive sleep apnea (adult) (pediatric): Secondary | ICD-10-CM | POA: Insufficient documentation

## 2018-07-19 DIAGNOSIS — I5042 Chronic combined systolic (congestive) and diastolic (congestive) heart failure: Secondary | ICD-10-CM

## 2018-07-19 DIAGNOSIS — E785 Hyperlipidemia, unspecified: Secondary | ICD-10-CM | POA: Insufficient documentation

## 2018-07-19 DIAGNOSIS — S0081XA Abrasion of other part of head, initial encounter: Secondary | ICD-10-CM | POA: Insufficient documentation

## 2018-07-19 DIAGNOSIS — Z6837 Body mass index (BMI) 37.0-37.9, adult: Secondary | ICD-10-CM | POA: Insufficient documentation

## 2018-07-19 DIAGNOSIS — I2510 Atherosclerotic heart disease of native coronary artery without angina pectoris: Secondary | ICD-10-CM | POA: Insufficient documentation

## 2018-07-19 DIAGNOSIS — K219 Gastro-esophageal reflux disease without esophagitis: Secondary | ICD-10-CM

## 2018-07-19 DIAGNOSIS — W010XXA Fall on same level from slipping, tripping and stumbling without subsequent striking against object, initial encounter: Secondary | ICD-10-CM | POA: Insufficient documentation

## 2018-07-19 DIAGNOSIS — N183 Chronic kidney disease, stage 3 (moderate): Secondary | ICD-10-CM | POA: Insufficient documentation

## 2018-07-19 DIAGNOSIS — E11319 Type 2 diabetes mellitus with unspecified diabetic retinopathy without macular edema: Secondary | ICD-10-CM | POA: Insufficient documentation

## 2018-07-19 DIAGNOSIS — IMO0002 Reserved for concepts with insufficient information to code with codable children: Secondary | ICD-10-CM

## 2018-07-19 DIAGNOSIS — E669 Obesity, unspecified: Secondary | ICD-10-CM | POA: Insufficient documentation

## 2018-07-19 DIAGNOSIS — L409 Psoriasis, unspecified: Secondary | ICD-10-CM | POA: Insufficient documentation

## 2018-07-19 DIAGNOSIS — E1140 Type 2 diabetes mellitus with diabetic neuropathy, unspecified: Secondary | ICD-10-CM | POA: Diagnosis not present

## 2018-07-19 DIAGNOSIS — G44311 Acute post-traumatic headache, intractable: Secondary | ICD-10-CM

## 2018-07-19 DIAGNOSIS — M109 Gout, unspecified: Secondary | ICD-10-CM

## 2018-07-19 DIAGNOSIS — F329 Major depressive disorder, single episode, unspecified: Secondary | ICD-10-CM | POA: Insufficient documentation

## 2018-07-19 DIAGNOSIS — Z8249 Family history of ischemic heart disease and other diseases of the circulatory system: Secondary | ICD-10-CM

## 2018-07-19 DIAGNOSIS — Z87891 Personal history of nicotine dependence: Secondary | ICD-10-CM

## 2018-07-19 DIAGNOSIS — W19XXXA Unspecified fall, initial encounter: Secondary | ICD-10-CM | POA: Diagnosis not present

## 2018-07-19 DIAGNOSIS — Z7982 Long term (current) use of aspirin: Secondary | ICD-10-CM

## 2018-07-19 DIAGNOSIS — I130 Hypertensive heart and chronic kidney disease with heart failure and stage 1 through stage 4 chronic kidney disease, or unspecified chronic kidney disease: Secondary | ICD-10-CM | POA: Insufficient documentation

## 2018-07-19 DIAGNOSIS — E1122 Type 2 diabetes mellitus with diabetic chronic kidney disease: Secondary | ICD-10-CM | POA: Insufficient documentation

## 2018-07-19 DIAGNOSIS — G8929 Other chronic pain: Secondary | ICD-10-CM | POA: Insufficient documentation

## 2018-07-19 DIAGNOSIS — S01511A Laceration without foreign body of lip, initial encounter: Secondary | ICD-10-CM | POA: Insufficient documentation

## 2018-07-19 DIAGNOSIS — E1142 Type 2 diabetes mellitus with diabetic polyneuropathy: Secondary | ICD-10-CM | POA: Insufficient documentation

## 2018-07-19 DIAGNOSIS — Z794 Long term (current) use of insulin: Secondary | ICD-10-CM | POA: Insufficient documentation

## 2018-07-19 DIAGNOSIS — E1165 Type 2 diabetes mellitus with hyperglycemia: Secondary | ICD-10-CM

## 2018-07-19 DIAGNOSIS — G4700 Insomnia, unspecified: Secondary | ICD-10-CM | POA: Insufficient documentation

## 2018-07-19 DIAGNOSIS — Z9181 History of falling: Secondary | ICD-10-CM

## 2018-07-19 DIAGNOSIS — R778 Other specified abnormalities of plasma proteins: Secondary | ICD-10-CM

## 2018-07-19 LAB — GLUCOSE, POCT (MANUAL RESULT ENTRY): POC Glucose: 250 mg/dl — AB (ref 70–99)

## 2018-07-19 MED ORDER — FAMOTIDINE 20 MG PO TABS: 20.0000 mg | ORAL_TABLET | Freq: Two times a day (BID) | ORAL | 6 refills | Status: DC

## 2018-07-19 NOTE — Patient Instructions (Signed)
Use warm compresses to the lip.  Use the triple antibiotic ointment ice a day on the abrasion to the right forehead.  You should bend with your knees and get up slowly when to stand.  I have referred you for some physical therapy.  Stop ranitidine.  We will use Pepcid instead.  I have referred you to an oncologist for the abnormal blood tests that you had done recently through the cardiologist.
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
## 2018-07-19 NOTE — Progress Notes (Signed)
Patient ID: Marco [name], male    DOB: [birth year]  MRN: [id number]  CC: Diabetes and Hypertension   Subjective: Marco [name] is a 62 y.o. male who presents for chronic ds management. His concerns today include:  62 year old male with history of HTN,DM2 with neuropathy and nephropathy, CKD stage III, obesity, nonobstructive CAD, chronic combined CHF, OSA on CPAP, followed by rheumatology in [facility] for psoriasis/gout/nonspec arthritis. On narcotics through [facility] for chronic pain in chest/back/shoulders/neck,  Insomnia  Pt fell face down at bus stop yesterday.  He had bent over to gather up his grocery bags and states that he lost his balance and landed on the pavement flat on the face.  He sustained an abrasion above the right eye and laceration to the lower lip.  Called the ambulance when he got home but states he was dissuaded from being seen in the ER when they told him that he did would not need sutures.  Reports having a similar fall about 2 months ago again when he bent over to pick up some grocery bags.  He denies any loss of consciousness on either of these occasions.  He denies feeling any dizziness prior to the episodes. -He endorses right frontal headache since hitting his head yesterday.  Also noticed some soreness over the right chest wall.  Wants to get off ranitidine.  He saw information on the news that it can cause cancer.  He would like to be changed to something different.  DM:  Changed from Levemir to NPH insulin 130 units daily by the endocrinologist.  Reports blood sugar range of 90-120 in the mornings, BS in afternoons in 200 after meals.   Does well with eating habits.  Avoids can foods.  CHF/nonobstructive CAD: He is seen cardiology a few times since last visit with me.  Dose of Entresto adjusted.  He denies any chest pains or shortness of breath at this time. Had abnormal SPEP through cardiology ?? Done as screen for Amyloid Patient Active Problem List     Diagnosis Date Noted  . NICM (nonischemic cardiomyopathy) ([place]) 07/06/2018  . Demand ischemia ([place])   . Hypokalemia   . NSTEMI (non-ST elevated myocardial infarction) ([place])   . Hyperlipidemia 12/23/2016  . Chronic combined systolic and diastolic CHF (congestive heart failure) ([place]) 09/09/2016  . Diabetes mellitus with complication ([place])   . Diabetic retinopathy ([place]) 07/22/2016  . Diabetic polyneuropathy associated with type 2 diabetes mellitus ([place]) 06/10/2016  . Myalgia and myositis 03/31/2016  . Chronic gouty arthritis 11/29/2015  . LBP (low back pain) 11/27/2015  . Arthralgia of multiple joints 11/27/2015  . Psoriasis 11/27/2015  . Breast pain in male 11/14/2015  . Sinusitis, chronic 10/16/2015  . Insomnia 10/16/2015  . New onset of headaches after age 76 09/20/2015  . OSA (obstructive sleep apnea) 07/12/2015  . Low serum testosterone level 05/18/2015  . Depression 05/16/2015  . Chronic pain   . CAD (coronary artery disease) 12/24/2014  . Atrial fibrillation ([place]) 12/24/2014  . Essential hypertension 12/24/2014  . Chronic kidney disease 12/24/2014  . PUD (peptic ulcer disease) 12/24/2014     Current Outpatient Medications on File Prior to Visit  Medication Sig Dispense Refill  . ACCU-CHEK FASTCLIX LANCETS MISC USE AS DIRECTED TO  CHECK  BLOOD  SUGAR  3  TIMES  DAILY 102 each 11  . albuterol (PROVENTIL HFA;VENTOLIN HFA) 108 (90 Base) MCG/ACT inhaler Inhale 2 puffs into the lungs every 6 (six) hours as needed for wheezing  or shortness of breath. 1 Inhaler 0  . allopurinol (ZYLOPRIM) 100 MG tablet Take 100 mg by mouth daily.    [name] aspirin (EQ ASPIRIN ADULT LOW DOSE) 81 MG EC tablet Take 1 tablet (81 mg total) by mouth daily. Swallow whole. 90 tablet 3  . atorvastatin (LIPITOR) 40 MG tablet TAKE 1 TABLET BY MOUTH ONCE DAILY 90 tablet 0  . Blood Pressure KIT 1 each by Does not apply route 2 (two) times daily. 1 each 0  . carvedilol (COREG) 3.125 MG tablet Take 1 tablet (3.125  mg total) by mouth 2 (two) times daily with a meal. 60 tablet 1  . DULoxetine (CYMBALTA) 20 MG capsule Take 20 mg by mouth daily.    . furosemide (LASIX) 40 MG tablet Take 1 tablet (40 mg total) by mouth 2 (two) times daily. 180 tablet 3  . glucose blood (ACCU-CHEK AVIVA PLUS) test strip 1 each by Other route 2 (two) times daily. And lancets 2/day 200 each 3  . hydrALAZINE (APRESOLINE) 10 MG tablet Take 1 tablet (10 mg total) by mouth every 8 (eight) hours. 90 tablet 1  . HYDROcodone-acetaminophen (NORCO) 7.5-325 MG tablet Take 1 tablet by mouth 3 (three) times daily.  0  . insulin NPH Human (HUMULIN N) 100 UNIT/ML injection Inject 1.3 mLs (130 Units total) into the skin every morning. 10 mL 11  . Insulin Pen Needle (BD PEN NEEDLE NANO U/F) 32G X 4 MM MISC Use to inject insulin every morning 100 each 11  . isosorbide mononitrate (IMDUR) 30 MG 24 hr tablet Take 1 tablet (30 mg total) by mouth daily. 90 tablet 3  . Potassium Chloride ER 20 MEQ TBCR Take 20 mEq by mouth daily. 90 tablet 3  . predniSONE (DELTASONE) 10 MG tablet Take 1 tablet (10 mg total) by mouth daily. 30 tablet 1  . pregabalin (LYRICA) 100 MG capsule Take 1 capsule (100 mg total) by mouth 3 (three) times daily. 90 capsule 3  . Secukinumab (COSENTYX) 150 MG/ML SOSY Inject 150 mg into the skin every 28 (twenty-eight) days.     [name] zolpidem (AMBIEN CR) 6.25 MG CR tablet Take 1 tablet (6.25 mg total) by mouth at bedtime as needed for sleep. Each prescription to last 1 month. 30 tablet 3   Current Facility-Administered Medications on File Prior to Visit  Medication Dose Route Frequency Provider Last Rate Last Dose  . sacubitril-valsartan (ENTRESTO) 97-103 mg per tablet  1 tablet Oral BID [name] T, [name]        No Known Allergies  Social History   Socioeconomic History  . Marital status: Married    Spouse name: Amethyst  . Number of children: 10  . Years of education: 39  . Highest education level: Not on file   Occupational History    Comment: disabled  Social Needs  . Financial resource strain: Somewhat hard  . Food insecurity:    Worry: Sometimes true    Inability: Sometimes true  . Transportation needs:    Medical: No    Non-medical: No  Tobacco Use  . Smoking status: Former Smoker    Packs/day: 0.50    Years: 10.00    Pack years: 5.00    Types: Cigarettes    Last attempt to quit: 04/11/2008    Years since quitting: 10.2  . Smokeless tobacco: Never Used  Substance and Sexual Activity  . Alcohol use: No    Alcohol/week: 0.0 standard drinks  . Drug use: No  .  Sexual activity: Yes    Partners: Female    Birth control/protection: None  Lifestyle  . Physical activity:    Days per week: Not on file    Minutes per session: Not on file  . Stress: Not on file  Relationships  . Social connections:    Talks on phone: Not on file    Gets together: Not on file    Attends religious service: Not on file    Active member of club or organization: Not on file    Attends meetings of clubs or organizations: Not on file    Relationship status: Not on file  . Intimate partner violence:    Fear of current or ex partner: Not on file    Emotionally abused: Not on file    Physically abused: Not on file    Forced sexual activity: Not on file  Other Topics Concern  . Not on file  Social History Narrative   Lives with wife. Does not work.  On disability.    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 Provided crisis assistance program information to assist with bills       Has issues getting food- gets $36/month in food stamps- already has list of food pantries but has not tried them- encouraged pt to try food pantries and reach out to clinic for help if needed    Family History  Problem Relation Age of Onset  .  Hypertension Mother   . Diabetes Mother   . COPD Mother   . Lung cancer Mother        Smoker   . Hypertension Father   . Diabetes Father   . Heart Problems Father   . COPD Father   . Colon cancer Neg Hx   . Stomach cancer Neg Hx     Past Surgical History:  Procedure Laterality Date  . CARDIAC CATHETERIZATION  05/2014   ablation for atrial fibrillation  . CARDIAC CATHETERIZATION N/A 11/16/2015   Procedure: Left Heart Cath and Coronary Angiography;  Surgeon: Leonie Man, MD;  Location: [facility];  Service: Cardiovascular;  Laterality: N/A;  . COLON RESECTION  09/2013   due to large, abnormal polpy. non cancerous per patient.   . COLON SURGERY  09/2014   colon resection   . LEFT HEART CATH AND CORONARY ANGIOGRAPHY N/A 05/19/2018   Procedure: LEFT HEART CATH AND CORONARY ANGIOGRAPHY;  Surgeon: Wellington Hampshire, MD;  Location: [facility];  Service: Cardiovascular;  Laterality: N/A;    ROS: Review of Systems Negative except as above. PHYSICAL EXAM: BP 127/73   Pulse 82   Temp 98.8 F (37.1 C) (Oral)   Resp 16   Wt 232 lb (105.2 kg)   SpO2 98%   BMI 37.45 kg/m   Wt Readings from Last 3 Encounters:  07/19/18 232 lb (105.2 kg)  07/06/18 230 lb 12.8 oz (104.7 kg)  06/15/18 229 lb 6.4 oz (104.1 kg)    Physical Exam General appearance - alert, well appearing, and in no distress Mental status - normal mood, behavior, speech, dress, motor activity, and thought processes Neck - supple, no significant adenopathy Chest - clear to auscultation, no wheezes, rales or rhonchi, symmetric air entry Heart - normal rate, regular rhythm, normal S1, S2,  no murmurs, rubs, clicks or gallops Neurological -cranial nerves grossly intact.  Power 5/5 bilaterally in all fours.  Gross sensation intact in the extremities.  Gait is steady.  Romberg negative.   Extremities -lower extremity edema. Skin - abrasion of about 4 cm in greatest diameter over RT brow.  Smaller abrasions over  RT cheek.  Laceration to lower lip RT side    Results for orders placed or performed in visit on 07/19/18  POCT glucose (manual entry)  Result Value Ref Range   POC Glucose 250 (A) 70 - 99 mg/dl   Lab Results  Component Value Date   HGBA1C 8.3 (A) 06/15/2018   No results found for: SPEP, UPEP  ASSESSMENT AND PLAN:  1. Intractable acute post-traumatic headache - CT Head Wo Contrast; Future  2. Abrasion of face, initial encounter Pt given some triple abx ointment samples, gauze and sterile water.  Advise to clean and apply BID.  Warm compresses to lip  3. Uncontrolled type 2 diabetes mellitus with diabetic neuropathy ([place]) Followed by endocrine - POCT glucose (manual entry)  4. Gastroesophageal reflux disease without esophagitis Change Zantac to Pepsid - famotidine (PEPCID) 20 MG tablet; Take 1 tablet (20 mg total) by mouth 2 (two) times daily.  Dispense: 60 tablet; Refill: 6  5. History of fall Advise to bend with knees and avoid getting up too quickly when standing - Ambulatory referral to Physical Therapy  6. Abnormal SPEP - Ambulatory referral to Hematology / Oncology    Patient was given the opportunity to ask questions.  Patient verbalized understanding of the plan and was able to repeat key elements of the plan.   Orders Placed This Encounter  Procedures  . CT Head Wo Contrast  . Ambulatory referral to Physical Therapy  . Ambulatory referral to Hematology / Oncology  . POCT glucose (manual entry)     Requested Prescriptions   Signed Prescriptions Disp Refills  . famotidine (PEPCID) 20 MG tablet 60 tablet 6    Sig: Take 1 tablet (20 mg total) by mouth 2 (two) times daily.    Return in about 2 months (around 09/19/2018).  Karle Plumber, MD, FACP

## 2018-07-19 NOTE — Telephone Encounter (Signed)
Went on the [employer] and didn prior auth for CT head wo contrast  CPT- [phone] CT HEAD or Brain; without contrast  ICD 10- G44.311 Acute post-traumatic headache, intractable  Authorization # [id number] Effective- 07/19/2018 Expires- 08/18/2018

## 2018-07-22 ENCOUNTER — Ambulatory Visit (HOSPITAL_COMMUNITY)
Admission: RE | Admit: 2018-07-22 | Discharge: 2018-07-22 | Disposition: A | Discharge status: Home / Self Care | Payer: Medicaid Other | Source: Ambulatory Visit | Attending: Cardiology | Admitting: Cardiology

## 2018-07-22 ENCOUNTER — Encounter (HOSPITAL_COMMUNITY): Payer: Self-pay

## 2018-07-22 VITALS — BP 148/92 | HR 80 | Wt 232.8 lb

## 2018-07-22 DIAGNOSIS — K219 Gastro-esophageal reflux disease without esophagitis: Secondary | ICD-10-CM | POA: Diagnosis not present

## 2018-07-22 DIAGNOSIS — I130 Hypertensive heart and chronic kidney disease with heart failure and stage 1 through stage 4 chronic kidney disease, or unspecified chronic kidney disease: Secondary | ICD-10-CM | POA: Diagnosis not present

## 2018-07-22 DIAGNOSIS — I2510 Atherosclerotic heart disease of native coronary artery without angina pectoris: Secondary | ICD-10-CM | POA: Diagnosis not present

## 2018-07-22 DIAGNOSIS — F40240 Claustrophobia: Secondary | ICD-10-CM | POA: Diagnosis not present

## 2018-07-22 DIAGNOSIS — G8929 Other chronic pain: Secondary | ICD-10-CM | POA: Insufficient documentation

## 2018-07-22 DIAGNOSIS — C9110 Chronic lymphocytic leukemia of B-cell type not having achieved remission: Secondary | ICD-10-CM | POA: Insufficient documentation

## 2018-07-22 DIAGNOSIS — N183 Chronic kidney disease, stage 3 unspecified: Secondary | ICD-10-CM

## 2018-07-22 DIAGNOSIS — D472 Monoclonal gammopathy: Secondary | ICD-10-CM | POA: Insufficient documentation

## 2018-07-22 DIAGNOSIS — I428 Other cardiomyopathies: Secondary | ICD-10-CM

## 2018-07-22 DIAGNOSIS — M069 Rheumatoid arthritis, unspecified: Secondary | ICD-10-CM | POA: Insufficient documentation

## 2018-07-22 DIAGNOSIS — I5042 Chronic combined systolic (congestive) and diastolic (congestive) heart failure: Secondary | ICD-10-CM

## 2018-07-22 DIAGNOSIS — Z7952 Long term (current) use of systemic steroids: Secondary | ICD-10-CM | POA: Diagnosis not present

## 2018-07-22 DIAGNOSIS — I4891 Unspecified atrial fibrillation: Secondary | ICD-10-CM | POA: Insufficient documentation

## 2018-07-22 DIAGNOSIS — I480 Paroxysmal atrial fibrillation: Secondary | ICD-10-CM | POA: Diagnosis not present

## 2018-07-22 DIAGNOSIS — E1122 Type 2 diabetes mellitus with diabetic chronic kidney disease: Secondary | ICD-10-CM | POA: Insufficient documentation

## 2018-07-22 DIAGNOSIS — E854 Organ-limited amyloidosis: Secondary | ICD-10-CM

## 2018-07-22 DIAGNOSIS — Z794 Long term (current) use of insulin: Secondary | ICD-10-CM | POA: Diagnosis not present

## 2018-07-22 DIAGNOSIS — D631 Anemia in chronic kidney disease: Secondary | ICD-10-CM | POA: Insufficient documentation

## 2018-07-22 DIAGNOSIS — I493 Ventricular premature depolarization: Secondary | ICD-10-CM | POA: Diagnosis not present

## 2018-07-22 DIAGNOSIS — Z8249 Family history of ischemic heart disease and other diseases of the circulatory system: Secondary | ICD-10-CM | POA: Diagnosis not present

## 2018-07-22 DIAGNOSIS — Z87891 Personal history of nicotine dependence: Secondary | ICD-10-CM | POA: Insufficient documentation

## 2018-07-22 DIAGNOSIS — G4733 Obstructive sleep apnea (adult) (pediatric): Secondary | ICD-10-CM | POA: Diagnosis not present

## 2018-07-22 DIAGNOSIS — I4819 Other persistent atrial fibrillation: Secondary | ICD-10-CM | POA: Insufficient documentation

## 2018-07-22 DIAGNOSIS — I43 Cardiomyopathy in diseases classified elsewhere: Secondary | ICD-10-CM | POA: Diagnosis not present

## 2018-07-22 DIAGNOSIS — R0602 Shortness of breath: Secondary | ICD-10-CM

## 2018-07-22 DIAGNOSIS — Z7982 Long term (current) use of aspirin: Secondary | ICD-10-CM | POA: Insufficient documentation

## 2018-07-22 DIAGNOSIS — Z79899 Other long term (current) drug therapy: Secondary | ICD-10-CM | POA: Insufficient documentation

## 2018-07-22 DIAGNOSIS — Z833 Family history of diabetes mellitus: Secondary | ICD-10-CM | POA: Insufficient documentation

## 2018-07-22 LAB — BASIC METABOLIC PANEL
ANION GAP: 10 (ref 5–15)
BUN: 9 mg/dL (ref 8–23)
CO2: 27 mmol/L (ref 22–32)
Calcium: 8.6 mg/dL — ABNORMAL LOW (ref 8.9–10.3)
Chloride: 105 mmol/L (ref 98–111)
Creatinine, Ser: 1.33 mg/dL — ABNORMAL HIGH (ref 0.61–1.24)
GFR calc Af Amer: >60 mL/min (ref >60)
GFR calc non Af Amer: 56 mL/min — ABNORMAL LOW (ref >60)
Glucose, Bld: 170 mg/dL — ABNORMAL HIGH (ref 70–99)
Potassium: 3.7 mmol/L (ref 3.5–5.1)
Sodium: 142 mmol/L (ref 135–145)

## 2018-07-22 MED ORDER — SACUBITRIL-VALSARTAN 49-51 MG PO TABS: 1.0000 | ORAL_TABLET | Freq: Two times a day (BID) | ORAL | 3 refills | Status: DC

## 2018-07-22 MED ORDER — CARVEDILOL 6.25 MG PO TABS: 6.2500 mg | ORAL_TABLET | Freq: Two times a day (BID) | ORAL | 3 refills | Status: DC

## 2018-07-22 NOTE — Progress Notes (Signed)
Advanced [facility] Failure Clinic Consult Note   Referring Physician: [name], [name]. PCP: [name], MD PCP-Cardiologist: [name], MD  HF: [name]   HPI:  Marco [name] is a 62 y.o. male with history of DM, HTN, non-obstructive CAD (caths 2017 and 05/2018),  chronic combined CHF (variable EF over the years, rececent decline to 35-40% by echo 04/2018 and 25-30% by cath 05/2018), persistent atrial fibrillation (s/p RFCA in [place] 2017, Xarelto previousy d/c'd 2/2 anemia and unknown source of blood loss), morbid obesity, OSA, CKD III, DM, esophageal stricture, prior gastritis 2014, rheumatoid arthritis who presents for f/u of CHF.  LHC 11/2015 showed minimal CAD, mild calcification noted extraluminally, normal LVF, normal LVEDP. CPX 06/2016 with limited interpretation due to submaximal effort during exercise, difficult to interpret, also of note was very intolerant to discomfort from several processes during the CPX procedure today (i.e. motion on bike, electrode removal, etc).  - Echo 09/03/16 EF 45-50% with global HK, grade 2 DD, elevated LV filling pressure. CTA 09/02/16 was negative for PE.  - Nuclear stress test 12/2016 was done for atypical CP showing small defect of severe severity in apical septal and apex location, no ischemia, EF 44% - suspected artifact.  - Echo 01/20/17 EF 50-55%, hypokinesis of apical inferoseptal myocardium, grade 1 diastolic dysfunction, mild MR, no sig change from prior.  - Event monitor 12/2016 showed NSR with occasional PVCs, otherwise benign.  - ETT 02/2017 was nondiagnostic due to 20 seconds of exercise and severe SOB.  He was seen by [name] in 03/2018 for SOB and found to have a drop in EF to 35-40% with inferior hypokinesis. ARB was stopped and he was started on hydralazine/nitrate combo. He was seen by [name], [name] on 04/30/2018 and coronary CTA was planned. Before this could be performed, however, he developed sharp stabbing chest pain with  exertion and worsening DOE. Labs were notable for mildly elevated troponin of 0.25 and hypokalemia of 2.9, with small pleural effusion on CXR. Cardiac cath 05/19/18 showed mild nonobstructive CAD, mod-severely elevated LVEDP 28, EF 25-30%. Admitted for diuresis and meds adjusted. Had AKI and transitioned back to po diuretics.   Last seen in [facility] 05/31/18. Was feeling great since recent med adjustment. With recent difficulties diuresing, sent to HF clinic for evaluation to exclude infiltrative disease.   He presents today for regular follow up. PYP scan positive. Myleloma panel with MGUS, discussed with [name] who recommend follow up labwork for common, non-pathologic findings. He has been lightheaded with recent med adjustments, fell yesterday after bending over and standing back up. His Entresto was increased by [facility] a couple of weeks ago, but he states he never made this change. Wearing CPAP 4-5 hrs, most nights. He remains SOB at times with his ADLs. Taking all other medications as directed. He has no children, but several siblings and an 69 yo mother who would need genetic testing if he is +.  Echo 05/19/18 LVEF 25-30%, Grade 1 DD, Trivial MR.   LHC 05/19/18  - Ost 2nd Diag lesion is 35% stenosed.  Prox LAD to Mid LAD lesion is 20% stenosed.  Ost Cx to Prox Cx lesion is 10% stenosed.  There is severe left ventricular systolic dysfunction.  LV end diastolic pressure is moderately elevated. The left ventricular ejection fraction is 25-35% by visual estimate. - LVEDP 28  CPX 06/19/2016 - Limited due to submaximal effort and poor amount of information.  FVC 2.35 (65%)    FEV1  1.94 (66%)     FEV1/FVC 82 (102%)     MVV 117 (86%) Post-Exercise PFTs--------- Not performed due to sudden termination of exercise Exercise Time:  1:56      Watts: 10 RPE: 17 Resting HR: 70 Peak HR: 84  (50% age predicted max HR) BP rest: 148/82 BP peak: 156/84 Peak VO2: 9.7 (50%  predicted peak VO2) VE/VCO2 slope: 30 OUES: 1.34 Peak RER: 0.97 Ventilatory Threshold: CANNOT BE DETERMINED Peak RR 78 Peak Ventilation: 28.8 VE/MVV: 25% PETCO2 at peak: 36 O2pulse: 11  (79% predicted O2pulse)   Review of systems complete and found to be negative unless listed in HPI.    Past Medical History:  Diagnosis Date  . Benign colon polyp 08/01/2013   [facility] in [place]. large base tranverse colon polyp was biopsied. polyp was benign with minimal surface hyperplastic change.  . Chronic combined systolic and diastolic CHF (congestive heart failure) ([place])   . Chronic pain   . CKD (chronic kidney disease), stage III ([place])   . Diabetes mellitus without complication ([place])   . Diverticulosis   . Esophageal hiatal hernia 07/29/2013   confirmed on EGD   . Esophageal stricture   . Essential hypertension   . Gastritis 07/29/2013   confirmed on EGD, bx done an negative for intestinal metaplasia, dsyplasia or H. pylori. normal gastric emptying study done 07/13/2013.  [name] GERD (gastroesophageal reflux disease)   . Morbid obesity ([place])   . Non-obstructive CAD    a. 02/2013 Cath ([place]): nonobs dzs;  b. 09/2014 Myoview ([place]): EF 55%, no ischemia;  c. Cath 05/2018 mild CAD no obstruction.  . Persistent atrial fibrillation    a. 02/2013 s/p rfca in [place], [place]-->prev on Xarelto, d/c'd 2/2 anemia, ? GIB.  [name] Rheumatoid arthritis ([place])   . Sigmoid diverticulosis 08/01/2013   confirmed on colonscopy. record scanned into chart    Current Outpatient Medications  Medication Sig Dispense Refill  . ACCU-CHEK FASTCLIX LANCETS MISC USE AS DIRECTED TO  CHECK  BLOOD  SUGAR  3  TIMES  DAILY 102 each 11  . albuterol (PROVENTIL HFA;VENTOLIN HFA) 108 (90 Base) MCG/ACT inhaler Inhale 2 puffs into the lungs every 6 (six) hours as needed for wheezing or shortness of breath. 1 Inhaler 0  . allopurinol (ZYLOPRIM) 100 MG tablet Take 100 mg by mouth daily.    [name] aspirin (EQ ASPIRIN ADULT  LOW DOSE) 81 MG EC tablet Take 1 tablet (81 mg total) by mouth daily. Swallow whole. 90 tablet 3  . atorvastatin (LIPITOR) 40 MG tablet TAKE 1 TABLET BY MOUTH ONCE DAILY 90 tablet 0  . Blood Pressure KIT 1 each by Does not apply route 2 (two) times daily. 1 each 0  . carvedilol (COREG) 3.125 MG tablet Take 1 tablet (3.125 mg total) by mouth 2 (two) times daily with a meal. 60 tablet 1  . DULoxetine (CYMBALTA) 20 MG capsule Take 20 mg by mouth daily.    . famotidine (PEPCID) 20 MG tablet Take 1 tablet (20 mg total) by mouth 2 (two) times daily. 60 tablet 6  . furosemide (LASIX) 40 MG tablet Take 1 tablet (40 mg total) by mouth 2 (two) times daily. 180 tablet 3  . glucose blood (ACCU-CHEK AVIVA PLUS) test strip 1 each by Other route 2 (two) times daily. And lancets 2/day 200 each 3  . hydrALAZINE (APRESOLINE) 10 MG tablet Take 1 tablet (10 mg total) by mouth every 8 (eight) hours. 90 tablet 1  .  HYDROcodone-acetaminophen (NORCO) 7.5-325 MG tablet Take 1 tablet by mouth 3 (three) times daily.  0  . insulin NPH Human (HUMULIN N) 100 UNIT/ML injection Inject 1.3 mLs (130 Units total) into the skin every morning. 10 mL 11  . Insulin Pen Needle (BD PEN NEEDLE NANO U/F) 32G X 4 MM MISC Use to inject insulin every morning 100 each 11  . isosorbide mononitrate (IMDUR) 30 MG 24 hr tablet Take 1 tablet (30 mg total) by mouth daily. 90 tablet 3  . Potassium Chloride ER 20 MEQ TBCR Take 20 mEq by mouth daily. 90 tablet 3  . predniSONE (DELTASONE) 10 MG tablet Take 1 tablet (10 mg total) by mouth daily. 30 tablet 1  . pregabalin (LYRICA) 100 MG capsule Take 1 capsule (100 mg total) by mouth 3 (three) times daily. 90 capsule 3  . Secukinumab (COSENTYX) 150 MG/ML SOSY Inject 150 mg into the skin every 28 (twenty-eight) days.     [name] zolpidem (AMBIEN CR) 6.25 MG CR tablet Take 1 tablet (6.25 mg total) by mouth at bedtime as needed for sleep. Each prescription to last 1 month. 30 tablet 3   Current  Facility-Administered Medications  Medication Dose Route Frequency Provider Last Rate Last Dose  . sacubitril-valsartan (ENTRESTO) 97-103 mg per tablet  1 tablet Oral BID [name] T, [name]        No Known Allergies    Social History   Socioeconomic History  . Marital status: Married    Spouse name: Amethyst  . Number of children: 10  . Years of education: 9  . Highest education level: Not on file  Occupational History    Comment: disabled  Social Needs  . Financial resource strain: Somewhat hard  . Food insecurity:    Worry: Sometimes true    Inability: Sometimes true  . Transportation needs:    Medical: No    Non-medical: No  Tobacco Use  . Smoking status: Former Smoker    Packs/day: 0.50    Years: 10.00    Pack years: 5.00    Types: Cigarettes    Last attempt to quit: 04/11/2008    Years since quitting: 10.2  . Smokeless tobacco: Never Used  Substance and Sexual Activity  . Alcohol use: No    Alcohol/week: 0.0 standard drinks  . Drug use: No  . Sexual activity: Yes    Partners: Female    Birth control/protection: None  Lifestyle  . Physical activity:    Days per week: Not on file    Minutes per session: Not on file  . Stress: Not on file  Relationships  . Social connections:    Talks on phone: Not on file    Gets together: Not on file    Attends religious service: Not on file    Active member of club or organization: Not on file    Attends meetings of clubs or organizations: Not on file    Relationship status: Not on file  . Intimate partner violence:    Fear of current or ex partner: Not on file    Emotionally abused: Not on file    Physically abused: Not on file    Forced sexual activity: Not on file  Other Topics Concern  . Not on file  Social History Narrative   Lives with wife. Does not work.  On disability.    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 Provided crisis assistance program information to assist with bills       Has issues getting food- gets $36/month in food stamps- already has list of food pantries but has not tried them- encouraged pt to try food pantries and reach out to clinic for help if needed      Family History  Problem Relation Age of Onset  . Hypertension Mother   . Diabetes Mother   . COPD Mother   . Lung cancer Mother        Smoker   . Hypertension Father   . Diabetes Father   . Heart Problems Father   . COPD Father   . Colon cancer Neg Hx   . Stomach cancer Neg Hx     Vitals:   07/22/18 1213  BP: (!) 148/92  Pulse: 80  SpO2: 94%  Weight: 105.6 kg (232 lb 12.8 oz)    Wt Readings from Last 3 Encounters:  07/22/18 105.6 kg (232 lb 12.8 oz)  07/19/18 105.2 kg (232 lb)  07/06/18 104.7 kg (230 lb 12.8 oz)     PHYSICAL EXAM: General:  Well appearing. No respiratory difficulty HEENT: normal x for poor dentition.  Neck: supple. JVP difficult. Does not appear markedly elevated  Carotids 2+ bilat; no bruits. No lymphadenopathy or thyromegaly appreciated. Cor: PMI nondisplaced. Regular rate & rhythm. No rubs, gallops or murmurs. Lungs: clear Abdomen: Obese soft, nontender, nondistended. No hepatosplenomegaly. No bruits or masses. Good bowel sounds Extremities: no cyanosis, clubbing, rash, edema Neuro: alert & oriented x 3, cranial nerves grossly intact. moves all 4 extremities w/o difficulty. Affect pleasant   ECG: 05/20/18 NSR 85 bpm, QRS 86 ms, PR interval 118, personally reviewed.  ASSESSMENT & PLAN:  1. Chronic combined systolic and diastolic CHF  - Echo 94/4/96 LVEF 25-30%, Grade 1 DD, Trivial MR.  - Very mild non-obstructive CAD on cath 05/19/18, likely NICM  - NYHA III symptoms.  - Volume status stable on exam  - Continue lasix 40 mg daily. - Dec[place]ae Entresto order to 49/51 mg BID. He never picked  up higher dose, and with lightheadedness, would not increase now.  - Increase coreg to 6.25 mg BID as less likely to have a strong effect on BP. - Continue hydralazine 10 mg TID - Continue imdur 30 mg daily.  - Etiology unclear. Ideally, would get cMRI, but limited from kidney function and claustrophobia.  - PYP scan + for Cardiac amyloidosis. Myeloma Panel with MGUS, no myeloma. Follow up labs today as below.  - Ordered for CPX test.  - Reinforced fluid restriction to < 2 L daily, sodium restriction to less than 2000 mg daily, and the importance of daily weights.   2. Cardiac Amyloidosis - By PYP scan 06/29/18. Grade 2, H/CLL equal 1.94. - Genetic testing sent.  - Tafamadis forms started 07/22/18 3. Nonobstructive CAD  - By cath 05/2018 as above - Continue ASA   4. CKD stage III - BMET today. 5. History of atrial fibrillation  - s/p RFCA 2017. - Previously on Xarelto, stopped due to anemia and unknown blood loss. Should consider re-challenge.  - CHA2DS2/VASc is at least 4. 6. DM2 - Per PCP 7. Arthritis - Somewhat limiting. On pain medicines. 8. MGUS by Myeloma panel - Discussed with [name].   - Will check serum kappa lambda light chains today. If +, will  follow up further.   Meds and labs as above. RTC 2 months with Echo for ICD consideration, with his high co-morbidities, suspect he will end up needing. Discussed ICD and cardiac amyloidosis at length in clinic today. Paperwork started for Tafamidis.   Shirley Friar, [name] 07/22/18   Greater than 50% of the 25 minute visit was spent in counseling/coordination of care regarding disease state education, salt/fluid restriction, sliding scale diuretics, and medication compliance.

## 2018-07-22 NOTE — Addendum Note (Signed)
Encounter addended by: Valeda Malm, RN on: 07/22/2018 2:41 PM  Actions taken: Order list changed, Diagnosis association updated

## 2018-07-22 NOTE — Patient Instructions (Signed)
START Entresto 49/51mg  twice daily.  INCREASE Carvedilol to 6.25mg  twice daily.  Routine lab work today. Will notify you of abnormal results  Follow up and echo in 2 months with Dr.Bensimhon.

## 2018-07-23 ENCOUNTER — Encounter (HOSPITAL_COMMUNITY): Payer: Self-pay

## 2018-07-23 ENCOUNTER — Telehealth (HOSPITAL_COMMUNITY): Payer: Self-pay | Admitting: *Deleted

## 2018-07-23 LAB — KAPPA/LAMBDA LIGHT CHAINS
KAPPA, LAMDA LIGHT CHAIN RATIO: 0.9 (ref 0.26–1.65)
Kappa free light chain: 31 mg/L — ABNORMAL HIGH (ref 3.3–19.4)
Lambda free light chains: 34.4 mg/L — ABNORMAL HIGH (ref 5.7–26.3)

## 2018-07-23 NOTE — Telephone Encounter (Signed)
[employer] enrollment form faxed.

## 2018-07-23 NOTE — Addendum Note (Signed)
Encounter addended by: Cathren Sween H, LCSW on: 07/23/2018 8:52 AM  Actions taken: Visit Navigator Flowsheet section accepted

## 2018-07-26 ENCOUNTER — Encounter: Payer: Medicaid Other | Admitting: Rehabilitative and Restorative Service Providers"

## 2018-07-29 ENCOUNTER — Encounter: Payer: Self-pay | Admitting: Internal Medicine

## 2018-07-29 ENCOUNTER — Ambulatory Visit (HOSPITAL_COMMUNITY)
Admission: EM | Admit: 2018-07-29 | Discharge: 2018-07-29 | Disposition: A | Discharge status: Home / Self Care | Payer: Medicaid Other | Attending: Family Medicine | Admitting: Family Medicine

## 2018-07-29 ENCOUNTER — Other Ambulatory Visit: Payer: Self-pay

## 2018-07-29 ENCOUNTER — Encounter (HOSPITAL_COMMUNITY): Payer: Self-pay | Admitting: Family Medicine

## 2018-07-29 ENCOUNTER — Telehealth: Payer: Self-pay | Admitting: Internal Medicine

## 2018-07-29 DIAGNOSIS — J0100 Acute maxillary sinusitis, unspecified: Secondary | ICD-10-CM

## 2018-07-29 DIAGNOSIS — S0081XA Abrasion of other part of head, initial encounter: Secondary | ICD-10-CM | POA: Diagnosis not present

## 2018-07-29 DIAGNOSIS — S01511A Laceration without foreign body of lip, initial encounter: Secondary | ICD-10-CM | POA: Insufficient documentation

## 2018-07-29 MED ORDER — AMOXICILLIN 875 MG PO TABS: 875.0000 mg | ORAL_TABLET | Freq: Two times a day (BID) | ORAL | 0 refills | Status: DC

## 2018-07-29 MED ORDER — MUPIROCIN 2 % EX OINT: 1.0000 "application " | TOPICAL_OINTMENT | Freq: Three times a day (TID) | CUTANEOUS | 1 refills | Status: DC

## 2018-07-29 NOTE — Addendum Note (Signed)
Encounter addended by: Tillery, Michael Andrew, [name] on: 07/29/2018 4:20 PM  Actions taken: LOS modified

## 2018-07-29 NOTE — ED Triage Notes (Addendum)
Left side of head started hurting Sunday or Monday, left ear pain, left neck and left face and aching in gums (no teeth)  Patient reports falling 2 weeks ago and landing on face and had a nose bleed, curious if this fall is related to complaints today

## 2018-07-29 NOTE — ED Provider Notes (Signed)
[place]    CSN: [id number] Arrival date & time: 07/29/18  1450     History   Chief Complaint Chief Complaint  Patient presents with  . URI    HPI Marco [name] is a 62 y.o. male.   This 62 year old man is making his first appearance at the most: Urgent care.  He complains of left side of head which started hurting Sunday or Monday, left ear pain, left neck and left face and aching in gums (no teeth)  Patient reports falling 2 weeks ago and landing on face and had a nose bleed, curious if this fall is related to complaints today.  Patient has an abrasion on his right forehead worry struck the floor when he slipped.  His pain today and for the last week has been on the left side of his face and in his gums.  He has had no loss of consciousness or focal weakness.  He describes a fall as having bent over to get the groceries and then getting dizzy losing his balance.  He has had no further problems with dizziness or balance.  He had some epistaxis from the left nostril after the fall.  Patient also has some right lower lateral rib tenderness without any ecchymosis or difficulty breathing.     Past Medical History:  Diagnosis Date  . Benign colon polyp 08/01/2013   [facility] in [place]. large base tranverse colon polyp was biopsied. polyp was benign with minimal surface hyperplastic change.  . Chronic combined systolic and diastolic CHF (congestive heart failure) ([place])   . Chronic pain   . CKD (chronic kidney disease), stage III ([place])   . Diabetes mellitus without complication ([place])   . Diverticulosis   . Esophageal hiatal hernia 07/29/2013   confirmed on EGD   . Esophageal stricture   . Essential hypertension   . Gastritis 07/29/2013   confirmed on EGD, bx done an negative for intestinal metaplasia, dsyplasia or H. pylori. normal gastric emptying study done 07/13/2013.  [name] GERD (gastroesophageal reflux disease)   . Morbid obesity ([place])   .  Non-obstructive CAD    a. 02/2013 Cath ([place]): nonobs dzs;  b. 09/2014 Myoview ([place]): EF 55%, no ischemia;  c. Cath 05/2018 mild CAD no obstruction.  . Persistent atrial fibrillation    a. 02/2013 s/[place] rfca in [place]-->prev on Xarelto, d/c'd 2/2 anemia, ? GIB.  [name] Rheumatoid arthritis ([place])   . Sigmoid diverticulosis 08/01/2013   confirmed on colonscopy. record scanned into chart    Patient Active Problem List   Diagnosis Date Noted  . NICM (nonischemic cardiomyopathy) ([place]) 07/06/2018  . Demand ischemia ([place])   . Hypokalemia   . NSTEMI (non-ST elevated myocardial infarction) ([place])   . Hyperlipidemia 12/23/2016  . Chronic combined systolic and diastolic CHF (congestive heart failure) ([place]) 09/09/2016  . Diabetes mellitus with complication ([place])   . Diabetic retinopathy ([place]) 07/22/2016  . Diabetic polyneuropathy associated with type 2 diabetes mellitus ([place]) 06/10/2016  . Myalgia and myositis 03/31/2016  . Chronic gouty arthritis 11/29/2015  . LBP (low back pain) 11/27/2015  . Arthralgia of multiple joints 11/27/2015  . Psoriasis 11/27/2015  . Breast pain in male 11/14/2015  . Sinusitis, chronic 10/16/2015  . Insomnia 10/16/2015  . New onset of headaches after age 90 09/20/2015  . OSA (obstructive sleep apnea) 07/12/2015  . Low serum testosterone level 05/18/2015  . Depression 05/16/2015  . Chronic pain   . CAD (coronary artery disease)  12/24/2014  . Atrial fibrillation ([place]) 12/24/2014  . Essential hypertension 12/24/2014  . Chronic kidney disease 12/24/2014  . PUD (peptic ulcer disease) 12/24/2014    Past Surgical History:  Procedure Laterality Date  . CARDIAC CATHETERIZATION  05/2014   ablation for atrial fibrillation  . CARDIAC CATHETERIZATION N/A 11/16/2015   Procedure: Left Heart Cath and Coronary Angiography;  Surgeon: Leonie Man, MD;  Location: [facility];  Service: Cardiovascular;  Laterality: N/A;  . COLON RESECTION  09/2013   due to large, abnormal  polpy. non cancerous per patient.   . COLON SURGERY  09/2014   colon resection   . LEFT HEART CATH AND CORONARY ANGIOGRAPHY N/A 05/19/2018   Procedure: LEFT HEART CATH AND CORONARY ANGIOGRAPHY;  Surgeon: Wellington Hampshire, MD;  Location: [facility];  Service: Cardiovascular;  Laterality: N/A;       Home Medications    Prior to Admission medications   Medication Sig Start Date End Date Taking? Authorizing Provider  ACCU-CHEK FASTCLIX LANCETS MISC USE AS DIRECTED TO  CHECK  BLOOD  SUGAR  3  TIMES  DAILY 04/16/18   [name], MD  albuterol (PROVENTIL HFA;VENTOLIN HFA) 108 (90 Base) MCG/ACT inhaler Inhale 2 puffs into the lungs every 6 (six) hours as needed for wheezing or shortness of breath. 04/29/18   [name], MD  allopurinol (ZYLOPRIM) 100 MG tablet Take 100 mg by mouth daily.    [provider]  amoxicillin (AMOXIL) 875 MG tablet Take 1 tablet (875 mg total) by mouth 2 (two) times daily. 07/29/18   Robyn Haber, MD  aspirin (EQ ASPIRIN ADULT LOW DOSE) 81 MG EC tablet Take 1 tablet (81 mg total) by mouth daily. Swallow whole. 02/04/18   [name], MD  atorvastatin (LIPITOR) 40 MG tablet TAKE 1 TABLET BY MOUTH ONCE DAILY 04/14/18   [name], MD  Blood Pressure KIT 1 each by Does not apply route 2 (two) times daily. 09/09/16   Boykin Nearing, MD  carvedilol (COREG) 6.25 MG tablet Take 1 tablet (6.25 mg total) by mouth 2 (two) times daily with a meal. 07/22/18   Tillery, Satira Mccallum, [name]  DULoxetine (CYMBALTA) 20 MG capsule Take 20 mg by mouth daily.    [provider]  famotidine (PEPCID) 20 MG tablet Take 1 tablet (20 mg total) by mouth 2 (two) times daily. 07/19/18   [name], MD  furosemide (LASIX) 40 MG tablet Take 1 tablet (40 mg total) by mouth 2 (two) times daily. 07/13/18 07/13/19  [name] T, [name]  glucose blood (ACCU-CHEK AVIVA PLUS) test strip 1 each by Other route 2 (two) times daily. And lancets 2/day  04/14/18   Renato Shin, MD  hydrALAZINE (APRESOLINE) 10 MG tablet Take 1 tablet (10 mg total) by mouth every 8 (eight) hours. 05/22/18   Cheryln Manly, NP  HYDROcodone-acetaminophen (NORCO) 7.5-325 MG tablet Take 1 tablet by mouth 3 (three) times daily. 03/21/18   [provider]  insulin NPH Human (HUMULIN N) 100 UNIT/ML injection Inject 1.3 mLs (130 Units total) into the skin every morning. 07/12/18   Renato Shin, MD  Insulin Pen Needle (BD PEN NEEDLE NANO U/F) 32G X 4 MM MISC Use to inject insulin every morning 07/12/18   Renato Shin, MD  isosorbide mononitrate (IMDUR) 30 MG 24 hr tablet Take 1 tablet (30 mg total) by mouth daily. 04/21/18 04/21/19  Sueanne Margarita, MD  mupirocin ointment (BACTROBAN) 2 %  Apply 1 application topically 3 (three) times daily. 07/29/18   Robyn Haber, MD  Potassium Chloride ER 20 MEQ TBCR Take 20 mEq by mouth daily. 07/13/18 07/13/19  [name] T, [name]  predniSONE (DELTASONE) 10 MG tablet Take 1 tablet (10 mg total) by mouth daily. 02/22/18   [name], MD  pregabalin (LYRICA) 100 MG capsule Take 1 capsule (100 mg total) by mouth 3 (three) times daily. 05/25/18   [name], MD  sacubitril-valsartan (ENTRESTO) 49-51 MG Take 1 tablet by mouth 2 (two) times daily. 07/22/18   Shirley Friar, [name]  Secukinumab (COSENTYX) 150 MG/ML SOSY Inject 150 mg into the skin every 28 (twenty-eight) days.  06/02/16   [provider]  zolpidem (AMBIEN CR) 6.25 MG CR tablet Take 1 tablet (6.25 mg total) by mouth at bedtime as needed for sleep. Each prescription to last 1 month. 04/29/18   [name], MD    Family History Family History  Problem Relation Age of Onset  . Hypertension Mother   . Diabetes Mother   . COPD Mother   . Lung cancer Mother        Smoker   . Hypertension Father   . Diabetes Father   . Heart Problems Father   . COPD Father   . Colon cancer Neg Hx   . Stomach cancer Neg Hx     Social  History Social History   Tobacco Use  . Smoking status: Former Smoker    Packs/day: 0.50    Years: 10.00    Pack years: 5.00    Types: Cigarettes    Last attempt to quit: 04/11/2008    Years since quitting: 10.3  . Smokeless tobacco: Never Used  Substance Use Topics  . Alcohol use: No    Alcohol/week: 0.0 standard drinks  . Drug use: No     Allergies   Patient has no known allergies.   Review of Systems Review of Systems  HENT: Positive for ear pain.   Neurological: Positive for headaches.     Physical Exam Triage Vital Signs ED Triage Vitals  Enc Vitals Group     BP      Pulse      Resp      Temp      Temp src      SpO2      Weight      Height      Head Circumference      Peak Flow      Pain Score      Pain Loc      Pain Edu?      Excl. in [place]?    No data found.  Updated Vital Signs BP 120/76 (BP Location: Left Arm) Comment (BP Location): large cuff  Pulse 78   Temp 98.9 F (37.2 C) (Oral)   Resp 16   SpO2 96%    Physical Exam Vitals signs and nursing note reviewed.  Constitutional:      Appearance: Normal appearance. He is obese.  HENT:     Head:     Comments: Patient has an abrasion with yellow eschar measuring 1 x 2-1/2 cm on the right forehead Mild left cheek tenderness and swelling    Right Ear: Tympanic membrane, ear canal and external ear normal.     Left Ear: Tympanic membrane, ear canal and external ear normal.     Nose: Nose normal.     Mouth/Throat:     Mouth: Mucous  membranes are moist.     Comments: Only 2 teeth left in his mouth. Eyes:     Extraocular Movements: Extraocular movements intact.     Conjunctiva/sclera: Conjunctivae normal.     Pupils: Pupils are equal, round, and reactive to light.  Neck:     Musculoskeletal: Normal range of motion and neck supple.  Cardiovascular:     Rate and Rhythm: Normal rate.     Heart sounds: Normal heart sounds.  Pulmonary:     Effort: Pulmonary effort is normal.     Breath sounds:  Normal breath sounds.  Chest:     Chest wall: Tenderness present.  Abdominal:     Palpations: Abdomen is soft.  Musculoskeletal: Normal range of motion.  Skin:    General: Skin is warm and dry.     Comments: Patient has a healing lower lip laceration just to the right of midline that is healing and does not cross the vermilion border.  Patient has an abrasion on his right forehead  Neurological:     General: No focal deficit present.     Mental Status: He is alert and oriented to person, place, and time. Mental status is at baseline.     Cranial Nerves: No cranial nerve deficit.     Motor: No weakness.     Gait: Gait normal.  Psychiatric:        Mood and Affect: Mood normal.        Thought Content: Thought content normal.      UC Treatments / Results  Labs (all labs ordered are listed, but only abnormal results are displayed) Labs Reviewed - No data to display  EKG None  Radiology No results found.  Procedures Procedures (including critical care time)  Medications Ordered in UC Medications - No data to display  Initial Impression / Assessment and Plan / UC Course  I have reviewed the triage vital signs and the nursing notes.  Pertinent labs & imaging results that were available during my care of the patient were reviewed by me and considered in my medical decision making (see chart for details).    Final Clinical Impressions(s) / UC Diagnoses   Final diagnoses:  Acute non-recurrent maxillary sinusitis  Lip laceration, initial encounter  Abrasion of forehead, initial encounter   Discharge Instructions   None    ED Prescriptions    Medication Sig Dispense Auth. Provider   amoxicillin (AMOXIL) 875 MG tablet Take 1 tablet (875 mg total) by mouth 2 (two) times daily. 20 tablet Robyn Haber, MD   mupirocin ointment (BACTROBAN) 2 % Apply 1 application topically 3 (three) times daily. 22 g Robyn Haber, MD     Controlled Substance Prescriptions [place]  Controlled Substance Registry consulted? Not Applicable   Robyn Haber, MD 07/29/18 [phone]

## 2018-07-29 NOTE — Telephone Encounter (Signed)
Received a referral from Dr. Wynetta Emery for abnl spep. Pt has been cld and scheduled to see Dr. Walden Field on 08/19/18 at 1050am. Pt agreed to the appt date and time. Letter mailed.

## 2018-07-30 ENCOUNTER — Ambulatory Visit: Payer: Medicaid Other | Attending: Internal Medicine | Admitting: Physical Therapy

## 2018-07-30 ENCOUNTER — Other Ambulatory Visit: Payer: Self-pay

## 2018-07-30 ENCOUNTER — Encounter: Payer: Self-pay | Admitting: Physical Therapy

## 2018-07-30 DIAGNOSIS — H8112 Benign paroxysmal vertigo, left ear: Secondary | ICD-10-CM

## 2018-07-30 DIAGNOSIS — R42 Dizziness and giddiness: Secondary | ICD-10-CM | POA: Diagnosis present

## 2018-07-30 DIAGNOSIS — R208 Other disturbances of skin sensation: Secondary | ICD-10-CM | POA: Diagnosis present

## 2018-07-30 DIAGNOSIS — R2681 Unsteadiness on feet: Secondary | ICD-10-CM

## 2018-07-30 DIAGNOSIS — R296 Repeated falls: Secondary | ICD-10-CM

## 2018-07-31 NOTE — Therapy (Signed)
[address] [place], [place], [address] Phone: [phone]   Fax:  [phone]  Physical Therapy Evaluation  Patient Details  Name: Marco [name] MRN: [id number] Date of Birth: 06-14-1956 Referring Provider (PT): [name], MD   Encounter Date: 07/30/2018  PT End of Session - 07/31/18 1508    Visit Number  1    Number of Visits  4    Date for PT Re-Evaluation  09/06/18    Authorization Type  Medicaid    Authorization Time Period  awaiting approval of 3 visits    PT Start Time  1105    PT Stop Time  1152    PT Time Calculation (min)  47 min    Activity Tolerance  Patient tolerated treatment well    Behavior During Therapy  [facility] for tasks assessed/performed       Past Medical History:  Diagnosis Date  . Benign colon polyp 08/01/2013   [facility] in [place]. large base tranverse colon polyp was biopsied. polyp was benign with minimal surface hyperplastic change.  . Chronic combined systolic and diastolic CHF (congestive heart failure) ([place])   . Chronic pain   . CKD (chronic kidney disease), stage III ([place])   . Diabetes mellitus without complication ([place])   . Diverticulosis   . Esophageal hiatal hernia 07/29/2013   confirmed on EGD   . Esophageal stricture   . Essential hypertension   . Gastritis 07/29/2013   confirmed on EGD, bx done an negative for intestinal metaplasia, dsyplasia or H. pylori. normal gastric emptying study done 07/13/2013.  [name] GERD (gastroesophageal reflux disease)   . Morbid obesity ([place])   . Non-obstructive CAD    a. 02/2013 Cath ([place]): nonobs dzs;  b. 09/2014 Myoview ([place]): EF 55%, no ischemia;  c. Cath 05/2018 mild CAD no obstruction.  . Persistent atrial fibrillation    a. 02/2013 s/p rfca in [place], [place]-->prev on Xarelto, d/c'd 2/2 anemia, ? GIB.  [name] Rheumatoid arthritis ([place])   . Sigmoid diverticulosis 08/01/2013   confirmed on colonscopy. record  scanned into chart    Past Surgical History:  Procedure Laterality Date  . CARDIAC CATHETERIZATION  05/2014   ablation for atrial fibrillation  . CARDIAC CATHETERIZATION N/A 11/16/2015   Procedure: Left Heart Cath and Coronary Angiography;  Surgeon: Leonie Man, MD;  Location: [place] [facility];  Service: Cardiovascular;  Laterality: N/A;  . COLON RESECTION  09/2013   due to large, abnormal polpy. non cancerous per patient.   . COLON SURGERY  09/2014   colon resection   . LEFT HEART CATH AND CORONARY ANGIOGRAPHY N/A 05/19/2018   Procedure: LEFT HEART CATH AND CORONARY ANGIOGRAPHY;  Surgeon: Wellington Hampshire, MD;  Location: [place] [facility];  Service: Cardiovascular;  Laterality: N/A;    There were no vitals filed for this visit.   Subjective Assessment - 07/30/18 1110    Subjective  Pt reports falling at bus stop when reaching down to pick up groceries.  Hit his face.  Reports that was the first major fall he has had but he does get dizzy when he bends down to the floor. Also reports that when he is walking he feels a little off balance.    Pertinent History  sigmoid diverticulosis, RA, Afib, non-obstructive CAD, obesity, GERD, gastritis, HTN, esophageal stricture, esophageal hiatal hernia, diverticulosis, DM, CKD, chronic pain and CHF    Limitations  Walking    Diagnostic tests  CT scan was negative for acute injury    Patient Stated Goals  "I don't see the need for therapy"    Currently in Pain?  Yes   premorbid pain in neck and shoulders        [facility] PT Assessment - 07/30/18 1115      Assessment   Medical Diagnosis  falls    Referring Provider (PT)  [name], MD    Onset Date/Surgical Date  07/19/18    Prior Therapy  yes      Precautions   Precautions  Fall    Precaution Comments  sigmoid diverticulosis, RA, Afib, non-obstructive CAD, obesity, GERD, gastritis, HTN, esophageal stricture, esophageal hiatal hernia, diverticulosis, DM, CKD, chronic pain and CHF       Balance Screen   Has the patient fallen in the past 6 months  Yes    How many times?  [place] residence    Living Arrangements  Alone    Type of [place] Access  Level entry      Prior Function   Level of [place]  takes public transportation to grocery store, does not have a car      Sensation   Light Touch  Impaired by gross assessment    Additional Comments  numbness in bottom of feet due to neuropathy      ROM / Strength   AROM / PROM / Strength  Strength      Strength   Overall Strength  Within functional limits for tasks performed      Special Tests   Other special tests  MCTSIB: 30 seconds on all 4 conditions but increased sway with condition 4.  Slight LOB when stepping backwards and off foam      Standardized Balance Assessment   Standardized Balance Assessment  Berg Balance Test;10 meter walk test;Five Times Sit to Stand    Five times sit to stand comments   11.5 seconds, no UE support      Berg Balance Test   Sit to Stand  Able to stand without using hands and stabilize independently    Standing Unsupported  Able to stand safely 2 minutes    Sitting with Back Unsupported but Feet Supported on Floor or Stool  Able to sit safely and securely 2 minutes    Stand to Sit  Sits safely with minimal use of hands    Transfers  Able to transfer safely, minor use of hands    Standing Unsupported with Eyes Closed  Able to stand 10 seconds safely    Standing Ubsupported with Feet Together  Able to place feet together independently and stand 1 minute safely    From Standing, Reach Forward with Outstretched Arm  Can reach confidently >25 cm (10")    From Standing Position, Pick up Object from Floor  Able to pick up shoe safely and easily    From Standing Position, Turn to Look Behind Over each Shoulder  Looks behind from both sides and weight shifts well    Turn 360 Degrees  Able to  turn 360 degrees safely in 4 seconds or less    Standing Unsupported, Alternately Place Feet on Step/Stool  Able to stand independently and safely and complete 8 steps in 20 seconds    Standing Unsupported, One Foot in Front  Able to plae foot ahead of  the other independently and hold 30 seconds    Standing on One Leg  Tries to lift leg/unable to hold 3 seconds but remains standing independently    Total Score  52    Berg comment:  52/56           Vestibular Assessment - 07/30/18 1132      Positional Testing   Dix-Hallpike  Dix-Hallpike Right;Dix-Hallpike Left    Sidelying Test  Sidelying Right;Sidelying Left    Horizontal Canal Testing  Horizontal Canal Right;Horizontal Canal Left      Dix-Hallpike Right   Dix-Hallpike Right Duration  0    Dix-Hallpike Right Symptoms  No nystagmus      Dix-Hallpike Left   Dix-Hallpike Left Duration  5 seconds    Dix-Hallpike Left Symptoms  Left nystagmus      Sidelying Right   Sidelying Right Duration  0    Sidelying Right Symptoms  No nystagmus      Sidelying Left   Sidelying Left Duration  30 seconds    Sidelying Left Symptoms  Other (comment)   nystagmus, difficulty determining laterality     Horizontal Canal Right   Horizontal Canal Right Duration  0    Horizontal Canal Right Symptoms  Normal      Horizontal Canal Left   Horizontal Canal Left Duration  10 seconds    Horizontal Canal Left Symptoms  Other (comment)   rotary, difficulty to determine direction     Positional Sensitivities   Nose to Right Knee  No dizziness    Right Knee to Sitting  No dizziness    Nose to Left Knee  No dizziness    Left Knee to Sitting  No dizziness    Head Turning x 5  No dizziness    Head Nodding x 5  No dizziness    Pivot Right in Standing  No dizziness    Pivot Left in Standing  No dizziness          Objective measurements completed on examination: See above findings.       Vestibular Treatment/Exercise - 07/30/18 1147       Vestibular Treatment/Exercise   Vestibular Treatment Provided  Canalith Repositioning    Canalith Repositioning  Epley Manuever Left       EPLEY MANUEVER LEFT   Number of Reps   1    Overall Response   Symptoms Resolved            PT Education - 07/31/18 1507    Education Details  clinical findings, BPPV, plan for visits in the new year    Person(s) Educated  Patient    Methods  Explanation    Comprehension  Verbalized understanding          PT Long Term Goals - 07/31/18 1512      PT LONG TERM GOAL #1   Title  Pt will be independent with balance HEP    Baseline  dependent currently    Time  4    Period  Weeks    Status  New    Target Date  09/06/18      PT LONG TERM GOAL #2   Title  Pt will demonstrate negative positional testing     Baseline  positive for vertigo and nystagmus with L hallpike-dix test    Time  4    Period  Weeks    Status  New    Target Date  09/06/18      PT  LONG TERM GOAL #3   Title  Pt will improve Berg balance by 2-3 points to indicate decreased falls risk    Baseline  52/56    Time  4    Period  Weeks    Status  New    Target Date  09/06/18      PT LONG TERM GOAL #4   Title  Pt will report no falls when ambulating in community and stepping on/off bus    Baseline  1-2 falls in community    Time  4    Period  Weeks    Status  New    Target Date  09/06/18             Plan - 07/31/18 1509    Clinical Impression Statement  Pt is a 62 year old male referred to Neuro OPPT for evaluation of dizziness and falls.  Pt's PMH is significant for the following: sigmoid diverticulosis, RA, Afib, non-obstructive CAD, obesity, GERD, gastritis, HTN, esophageal stricture, esophageal hiatal hernia, diverticulosis, DM, CKD, chronic pain and CHF. The following deficits were noted during pt's exam: impaired sensation, impaired balance, vertigo and upbeating, rotary nystagmus of short duration during L hallpike-dix positional testing indicating L  posterior canal BPPV resolved with one CRM.  Pt's BERG, five time sit to stand score and MCTSIB indicate pt is at low risk for falls. Pt would benefit from skilled PT to address these impairments and functional limitations to maximize functional mobility independence and reduce falls risk.    History and Personal Factors relevant to plan of care:  pt lives alone and and takes public transportation to go to the store; fall with injury when at bus stop, sigmoid diverticulosis, RA, Afib, non-obstructive CAD, obesity, GERD, gastritis, HTN, esophageal stricture, esophageal hiatal hernia, diverticulosis, DM, CKD, chronic pain and CHF    Clinical Presentation  Stable    Clinical Presentation due to:  pt lives alone and and takes public transportation to go to the store; fall with injury when at bus stop, sigmoid diverticulosis, RA, Afib, non-obstructive CAD, obesity, GERD, gastritis, HTN, esophageal stricture, esophageal hiatal hernia, diverticulosis, DM, CKD, chronic pain and CHF    Clinical Decision Making  Low    Rehab Potential  Good    PT Frequency  1x / week    PT Duration  3 weeks    PT Treatment/Interventions  ADLs/Self Care Home Management;Canalith Repostioning;Therapeutic activities;Therapeutic exercise;Balance training;Neuromuscular re-education;Patient/family education;Vestibular    PT Next Visit Plan  re-test for BPPV; provide multi sensory balance training and HEP    Consulted and Agree with Plan of Care  Patient       Patient will benefit from skilled therapeutic intervention in order to improve the following deficits and impairments:  Decreased balance, Dizziness, Impaired sensation  Visit Diagnosis: Repeated falls  Unsteadiness on feet  Dizziness and giddiness  BPPV (benign paroxysmal positional vertigo), left  Other disturbances of skin sensation     Problem List Patient Active Problem List   Diagnosis Date Noted  . NICM (nonischemic cardiomyopathy) ([place]) 07/06/2018  .  Demand ischemia ([place])   . Hypokalemia   . NSTEMI (non-ST elevated myocardial infarction) ([place])   . Hyperlipidemia 12/23/2016  . Chronic combined systolic and diastolic CHF (congestive heart failure) ([place]) 09/09/2016  . Diabetes mellitus with complication ([place])   . Diabetic retinopathy ([place]) 07/22/2016  . Diabetic polyneuropathy associated with type 2 diabetes mellitus ([place]) 06/10/2016  . Myalgia and myositis 03/31/2016  . Chronic gouty arthritis 11/29/2015  .  LBP (low back pain) 11/27/2015  . Arthralgia of multiple joints 11/27/2015  . Psoriasis 11/27/2015  . Breast pain in male 11/14/2015  . Sinusitis, chronic 10/16/2015  . Insomnia 10/16/2015  . New onset of headaches after age 62 09/20/2015  . OSA (obstructive sleep apnea) 07/12/2015  . Low serum testosterone level 05/18/2015  . Depression 05/16/2015  . Chronic pain   . CAD (coronary artery disease) 12/24/2014  . Atrial fibrillation ([place]) 12/24/2014  . Essential hypertension 12/24/2014  . Chronic kidney disease 12/24/2014  . PUD (peptic ulcer disease) 12/24/2014   Rico Junker, PT, DPT 07/31/18    3:18 PM    [facility] [address] [place], [place], [address] Phone: [phone]   Fax:  [phone]  Name: Mavrick Mcquigg MRN: [id number] Date of Birth: Jan 16, 1956

## 2018-08-06 ENCOUNTER — Ambulatory Visit (HOSPITAL_COMMUNITY)
Admission: EM | Admit: 2018-08-06 | Discharge: 2018-08-06 | Disposition: A | Discharge status: Home / Self Care | Payer: Medicaid Other | Attending: Internal Medicine | Admitting: Internal Medicine

## 2018-08-06 ENCOUNTER — Encounter (HOSPITAL_COMMUNITY): Payer: Self-pay

## 2018-08-06 DIAGNOSIS — R51 Headache: Secondary | ICD-10-CM | POA: Insufficient documentation

## 2018-08-06 DIAGNOSIS — R0981 Nasal congestion: Secondary | ICD-10-CM

## 2018-08-06 DIAGNOSIS — G4452 New daily persistent headache (NDPH): Secondary | ICD-10-CM | POA: Insufficient documentation

## 2018-08-06 DIAGNOSIS — R519 Headache, unspecified: Secondary | ICD-10-CM

## 2018-08-06 MED ORDER — CLINDAMYCIN HCL 300 MG PO CAPS: 300.0000 mg | ORAL_CAPSULE | Freq: Two times a day (BID) | ORAL | 0 refills | Status: AC

## 2018-08-06 MED ORDER — TRAMADOL HCL 50 MG PO TABS: 50.0000 mg | ORAL_TABLET | Freq: Four times a day (QID) | ORAL | 0 refills | Status: DC | PRN

## 2018-08-06 MED ORDER — TRIAMCINOLONE ACETONIDE 55 MCG/ACT NA AERO: 2.0000 | INHALATION_SPRAY | Freq: Every day | NASAL | 0 refills | Status: DC

## 2018-08-06 NOTE — ED Triage Notes (Signed)
Pt presents for follow up visit from a week ago with facial pain, head pain and pain in mouth.

## 2018-08-06 NOTE — Discharge Instructions (Signed)
Persistent bad headache may be due to recent fall with head strike, or related to sinusitis seen on head CT.  Persistent nasal congestion on exam.  Stop amoxicillin; prescription for clindamycin (antibiotic good for sinus infection) was sent to the pharmacy, along with prescription for nasal steroid spray for congestion.  A few tramadol (pain pills, #10) were sent to the pharmacy as well, for very severe pain.  Followup with your primary care provider and/or the pain clinic to discuss next steps in managing this persistent bad headache.  No danger signs on exam today, and facial wounds are healing well.

## 2018-08-06 NOTE — ED Provider Notes (Signed)
[place]    CSN: [id number] Arrival date & time: 08/06/18  0845     History   Chief Complaint Chief Complaint  Patient presents with  . Follow-up    HPI Erik Burkett is a 62 y.o. male.   He presents today with persistent left-sided bad headache, onset after a fall with head strike on December 8.  He saw his PCP on 12/9, and had a head CT which was notable only for some sinusitis.  He was seen at the urgent care on December 19, and given a prescription for amoxicillin.  This has not resolved his headache. Current symptoms include bad left-sided headache, with pain into the left upper gums and jaw.  He does not have runny nose, no cough, no fever.  Does have some nasal congestion.  Not sick on his stomach, no vomiting, no diarrhea.  Says he is not supposed to take anti-inflammatory medicines because of a "bad stomach", but has been taking some Aleve.  Tylenol is not helping him. He has pain management through the [facility] clinic.    HPI  Past Medical History:  Diagnosis Date  . Benign colon polyp 08/01/2013   [facility] in [place]. large base tranverse colon polyp was biopsied. polyp was benign with minimal surface hyperplastic change.  . Chronic combined systolic and diastolic CHF (congestive heart failure) ([place])   . Chronic pain   . CKD (chronic kidney disease), stage III ([place])   . Diabetes mellitus without complication ([place])   . Diverticulosis   . Esophageal hiatal hernia 07/29/2013   confirmed on EGD   . Esophageal stricture   . Essential hypertension   . Gastritis 07/29/2013   confirmed on EGD, bx done an negative for intestinal metaplasia, dsyplasia or H. pylori. normal gastric emptying study done 07/13/2013.  [name] GERD (gastroesophageal reflux disease)   . Morbid obesity ([place])   . Non-obstructive CAD    a. 02/2013 Cath ([place]): nonobs dzs;  b. 09/2014 Myoview ([place]): EF 55%, no ischemia;  c. Cath 05/2018 mild CAD no obstruction.  . Persistent atrial  fibrillation    a. 02/2013 s/p rfca in [place], [place]-->prev on Xarelto, d/c'd 2/2 anemia, ? GIB.  [name] Rheumatoid arthritis ([place])   . Sigmoid diverticulosis 08/01/2013   confirmed on colonscopy. record scanned into chart    Patient Active Problem List   Diagnosis Date Noted  . NICM (nonischemic cardiomyopathy) ([place]) 07/06/2018  . Demand ischemia ([place])   . Hypokalemia   . NSTEMI (non-ST elevated myocardial infarction) ([place])   . Hyperlipidemia 12/23/2016  . Chronic combined systolic and diastolic CHF (congestive heart failure) ([place]) 09/09/2016  . Diabetes mellitus with complication ([place])   . Diabetic retinopathy ([place]) 07/22/2016  . Diabetic polyneuropathy associated with type 2 diabetes mellitus ([place]) 06/10/2016  . Myalgia and myositis 03/31/2016  . Chronic gouty arthritis 11/29/2015  . LBP (low back pain) 11/27/2015  . Arthralgia of multiple joints 11/27/2015  . Psoriasis 11/27/2015  . Breast pain in male 11/14/2015  . Sinusitis, chronic 10/16/2015  . Insomnia 10/16/2015  . New onset of headaches after age 30 09/20/2015  . OSA (obstructive sleep apnea) 07/12/2015  . Low serum testosterone level 05/18/2015  . Depression 05/16/2015  . Chronic pain   . CAD (coronary artery disease) 12/24/2014  . Atrial fibrillation ([place]) 12/24/2014  . Essential hypertension 12/24/2014  . Chronic kidney disease 12/24/2014  . PUD (peptic ulcer disease) 12/24/2014    Past Surgical History:  Procedure Laterality Date  .  CARDIAC CATHETERIZATION  05/2014   ablation for atrial fibrillation  . CARDIAC CATHETERIZATION N/A 11/16/2015   Procedure: Left Heart Cath and Coronary Angiography;  Surgeon: Leonie Man, MD;  Location: [facility];  Service: Cardiovascular;  Laterality: N/A;  . COLON RESECTION  09/2013   due to large, abnormal polpy. non cancerous per patient.   . COLON SURGERY  09/2014   colon resection   . LEFT HEART CATH AND CORONARY ANGIOGRAPHY N/A 05/19/2018   Procedure: LEFT HEART  CATH AND CORONARY ANGIOGRAPHY;  Surgeon: Wellington Hampshire, MD;  Location: [facility];  Service: Cardiovascular;  Laterality: N/A;       Home Medications    Prior to Admission medications   Medication Sig Start Date End Date Taking? Authorizing Provider  ACCU-CHEK FASTCLIX LANCETS MISC USE AS DIRECTED TO  CHECK  BLOOD  SUGAR  3  TIMES  DAILY 04/16/18   [name], MD  albuterol (PROVENTIL HFA;VENTOLIN HFA) 108 (90 Base) MCG/ACT inhaler Inhale 2 puffs into the lungs every 6 (six) hours as needed for wheezing or shortness of breath. 04/29/18   [name], MD  allopurinol (ZYLOPRIM) 100 MG tablet Take 100 mg by mouth daily.    [provider]  amoxicillin (AMOXIL) 875 MG tablet Take 1 tablet (875 mg total) by mouth 2 (two) times daily. 07/29/18   Robyn Haber, MD  aspirin (EQ ASPIRIN ADULT LOW DOSE) 81 MG EC tablet Take 1 tablet (81 mg total) by mouth daily. Swallow whole. 02/04/18   [name], MD  atorvastatin (LIPITOR) 40 MG tablet TAKE 1 TABLET BY MOUTH ONCE DAILY 04/14/18   [name], MD  Blood Pressure KIT 1 each by Does not apply route 2 (two) times daily. 09/09/16   Boykin Nearing, MD  carvedilol (COREG) 6.25 MG tablet Take 1 tablet (6.25 mg total) by mouth 2 (two) times daily with a meal. 07/22/18   Tillery, Satira Mccallum, [name]  clindamycin (CLEOCIN) 300 MG capsule Take 1 capsule (300 mg total) by mouth 2 (two) times daily for 7 days. 08/06/18 08/13/18  Wynona Luna, MD  DULoxetine (CYMBALTA) 20 MG capsule Take 20 mg by mouth daily.    [provider]  famotidine (PEPCID) 20 MG tablet Take 1 tablet (20 mg total) by mouth 2 (two) times daily. 07/19/18   [name], MD  furosemide (LASIX) 40 MG tablet Take 1 tablet (40 mg total) by mouth 2 (two) times daily. 07/13/18 07/13/19  [name] T, [name]  glucose blood (ACCU-CHEK AVIVA PLUS) test strip 1 each by Other route 2 (two) times daily. And lancets 2/day 04/14/18    Renato Shin, MD  hydrALAZINE (APRESOLINE) 10 MG tablet Take 1 tablet (10 mg total) by mouth every 8 (eight) hours. 05/22/18   Cheryln Manly, NP  HYDROcodone-acetaminophen (NORCO) 7.5-325 MG tablet Take 1 tablet by mouth 3 (three) times daily. 03/21/18   [provider]  insulin NPH Human (HUMULIN N) 100 UNIT/ML injection Inject 1.3 mLs (130 Units total) into the skin every morning. 07/12/18   Renato Shin, MD  Insulin Pen Needle (BD PEN NEEDLE NANO U/F) 32G X 4 MM MISC Use to inject insulin every morning 07/12/18   Renato Shin, MD  isosorbide mononitrate (IMDUR) 30 MG 24 hr tablet Take 1 tablet (30 mg total) by mouth daily. 04/21/18 04/21/19  Sueanne Margarita, MD  mupirocin ointment (BACTROBAN) 2 % Apply 1 application topically 3 (three) times daily. 07/29/18  Robyn Haber, MD  Potassium Chloride ER 20 MEQ TBCR Take 20 mEq by mouth daily. 07/13/18 07/13/19  [name] T, [name]  predniSONE (DELTASONE) 10 MG tablet Take 1 tablet (10 mg total) by mouth daily. 02/22/18   [name], MD  pregabalin (LYRICA) 100 MG capsule Take 1 capsule (100 mg total) by mouth 3 (three) times daily. 05/25/18   [name], MD  sacubitril-valsartan (ENTRESTO) 49-51 MG Take 1 tablet by mouth 2 (two) times daily. 07/22/18   Shirley Friar, [name]  Secukinumab (COSENTYX) 150 MG/ML SOSY Inject 150 mg into the skin every 28 (twenty-eight) days.  06/02/16   [provider]  traMADol (ULTRAM) 50 MG tablet Take 1 tablet (50 mg total) by mouth every 6 (six) hours as needed. 08/06/18   Wynona Luna, MD  triamcinolone (NASACORT) 55 MCG/ACT AERO nasal inhaler Place 2 sprays into the nose daily. 08/06/18   Wynona Luna, MD  zolpidem (AMBIEN CR) 6.25 MG CR tablet Take 1 tablet (6.25 mg total) by mouth at bedtime as needed for sleep. Each prescription to last 1 month. 04/29/18   [name], MD    Family History Family History  Problem Relation Age of Onset  .  Hypertension Mother   . Diabetes Mother   . COPD Mother   . Lung cancer Mother        Smoker   . Hypertension Father   . Diabetes Father   . Heart Problems Father   . COPD Father   . Colon cancer Neg Hx   . Stomach cancer Neg Hx     Social History Social History   Tobacco Use  . Smoking status: Former Smoker    Packs/day: 0.50    Years: 10.00    Pack years: 5.00    Types: Cigarettes    Last attempt to quit: 04/11/2008    Years since quitting: 10.3  . Smokeless tobacco: Never Used  Substance Use Topics  . Alcohol use: No    Alcohol/week: 0.0 standard drinks  . Drug use: No     Allergies   Acetaminophen Able to take tylenol (brand name)  Review of Systems Review of Systems  All other systems reviewed and are negative.    Physical Exam Triage Vital Signs ED Triage Vitals  Enc Vitals Group     BP 08/06/18 0930 139/75     Pulse Rate 08/06/18 0930 86     Resp 08/06/18 0930 20     Temp 08/06/18 0930 98.8 F (37.1 C)     Temp Source 08/06/18 0930 Oral     SpO2 08/06/18 0930 96 %     Weight --      Height --      Pain Score 08/06/18 0933 7     Pain Loc --    Updated Vital Signs BP 139/75 (BP Location: Left Arm)   Pulse 86   Temp 98.8 F (37.1 C) (Oral)   Resp 20   SpO2 96%  Physical Exam Vitals signs and nursing note reviewed.  Constitutional:      General: He is not in acute distress.    Comments: Alert, nicely groomed  HENT:     Head: Atraumatic.     Comments: Bilateral TMs are mildly dull, flushed a little pink Marked nasal congestion, nearly occluded bilaterally Edentulous in the upper jaw, no tenderness to palpation, no gum swelling or redness Has a couple of teeth in the bottom jaw Eyes:  Comments: Conjugate gaze, no eye redness/drainage  Neck:     Musculoskeletal: Neck supple.  Cardiovascular:     Rate and Rhythm: Normal rate and regular rhythm.  Pulmonary:     Effort: No respiratory distress.     Breath sounds: No wheezing or rales.       Comments: Lungs clear, symmetric breath sounds  Abdominal:     General: There is no distension.     Palpations: Abdomen is soft.  Musculoskeletal: Normal range of motion.  Skin:    General: Skin is warm and dry.     Comments: No cyanosis  Neurological:     Mental Status: He is alert and oriented to person, place, and time.     Comments: Face is symmetric, speech is clear and coherent.  The patient was able to walk independently into the urgent care, and climb on/off the exam table without difficulty.       Final Clinical Impressions(s) / UC Diagnoses   Final diagnoses:  Bad headache  Headache, new daily persistent ([place])  Sinus congestion     Discharge Instructions     Persistent bad headache may be due to recent fall with head strike, or related to sinusitis seen on head CT.  Persistent nasal congestion on exam.  Stop amoxicillin; prescription for clindamycin (antibiotic good for sinus infection) was sent to the pharmacy, along with prescription for nasal steroid spray for congestion.  A few tramadol (pain pills, #10) were sent to the pharmacy as well, for very severe pain.  Followup with your primary care provider and/or the pain clinic to discuss next steps in managing this persistent bad headache.  No danger signs on exam today, and facial wounds are healing well.    ED Prescriptions    Medication Sig Dispense Auth. Provider   triamcinolone (NASACORT) 55 MCG/ACT AERO nasal inhaler Place 2 sprays into the nose daily. 1 Inhaler Wynona Luna, MD   clindamycin (CLEOCIN) 300 MG capsule Take 1 capsule (300 mg total) by mouth 2 (two) times daily for 7 days. 14 capsule Wynona Luna, MD   traMADol (ULTRAM) 50 MG tablet Take 1 tablet (50 mg total) by mouth every 6 (six) hours as needed. 10 tablet Wynona Luna, MD     Controlled Substance Prescriptions [place] Controlled Substance Registry consulted? Yes, I have consulted the [place] Controlled Substances Registry  for this patient, and feel the risk/benefit ratio today is favorable for proceeding with this prescription for a controlled substance.   NOTE:  Pharmacy challenged the rx for tramadol, but limited options exist for pain relief for this patient's acute pain.  Patient is not experiencing any relief of headache with tylenol and has been instructed not to take NSAIDs (hx bleeding ulcer, also has diabetes with CKD).  Patient to review ongoing management of new/acute pain issue with PCP and pain clinic.  I stand by this rx for a small number of tramadol for emergency/acute relief of new headache.   Wynona Luna, MD 08/08/18 1245

## 2018-08-06 NOTE — ED Notes (Signed)
Pt states he had a really bad fall 2 weeks ago; pt fell flat on face and has been having symptoms ever since hie fall; facial pain, head & neck pain, ear pain and mouth pain.

## 2018-08-17 ENCOUNTER — Encounter: Payer: Self-pay | Admitting: Endocrinology

## 2018-08-17 ENCOUNTER — Ambulatory Visit (INDEPENDENT_AMBULATORY_CARE_PROVIDER_SITE_OTHER): Payer: Medicaid Other | Admitting: Endocrinology

## 2018-08-17 VITALS — BP 144/84 | HR 80 | Ht 66.0 in | Wt 231.8 lb

## 2018-08-17 DIAGNOSIS — E1140 Type 2 diabetes mellitus with diabetic neuropathy, unspecified: Secondary | ICD-10-CM | POA: Diagnosis not present

## 2018-08-17 DIAGNOSIS — E1165 Type 2 diabetes mellitus with hyperglycemia: Secondary | ICD-10-CM | POA: Diagnosis not present

## 2018-08-17 DIAGNOSIS — E118 Type 2 diabetes mellitus with unspecified complications: Secondary | ICD-10-CM

## 2018-08-17 DIAGNOSIS — IMO0002 Reserved for concepts with insufficient information to code with codable children: Secondary | ICD-10-CM

## 2018-08-17 LAB — POCT GLYCOSYLATED HEMOGLOBIN (HGB A1C): Hemoglobin A1C: 8.2 % — AB (ref 4.0–5.6)

## 2018-08-17 MED ORDER — INSULIN NPH (HUMAN) (ISOPHANE) 100 UNIT/ML ~~LOC~~ SUSP: 140.0000 [IU] | SUBCUTANEOUS | 11 refills | Status: DC

## 2018-08-17 NOTE — Patient Instructions (Addendum)
Your blood pressure is high today.  Please see your primary care provider soon, to have it rechecked check your blood sugar twice a day.  vary the time of day when you check, between before the 3 meals, and at bedtime.  also check if you have symptoms of your blood sugar being too high or too low.  please keep a record of the readings and bring it to your next appointment here (or you can bring the meter itself).  You can write it on any piece of paper.  please call us sooner if your blood sugar goes below 70, or if you have a lot of readings over 200.   Please increase the insulin to 140 units each morning.  I have sent a prescription to your pharmacy.   On this type of insulin schedule, you should eat meals on a regular schedule (especially lunch).  If a meal is missed or significantly delayed, your blood sugar could go low.  Please come back for a follow-up appointment in 2 months.

## 2018-08-17 NOTE — Progress Notes (Signed)
Subjective:    Patient ID: Marco [name], male    DOB: 1955-10-11, 62 y.o.   MRN: [id number]  HPI Pt returns for f/u of diabetes mellitus: DM type: Insulin-requiring type 2 Dx'ed: 6270 Complications: polyneuropathy, CAD, NPDR, and renal failure.  Therapy: insulin since soon after dx DKA: never Severe hypoglycemia: never.  Pancreatitis: never.  Pancreatic imaging: never.  Other: he chronically takes prednisone, for psoriasis, with no plan for taper; he declined to continue multiple daily injections.  Interval history: no cbg record, but states cbg's vary from 95-130 fasting.  He does not check later in the day, but he says he never misses the insulin.  pt states he feels well in general.   Past Medical History:  Diagnosis Date  . Benign colon polyp 08/01/2013   [facility] in [place]. large base tranverse colon polyp was biopsied. polyp was benign with minimal surface hyperplastic change.  . Chronic combined systolic and diastolic CHF (congestive heart failure) ([place])   . Chronic pain   . CKD (chronic kidney disease), stage III ([place])   . Diabetes mellitus without complication ([place])   . Diverticulosis   . Esophageal hiatal hernia 07/29/2013   confirmed on EGD   . Esophageal stricture   . Essential hypertension   . Gastritis 07/29/2013   confirmed on EGD, bx done an negative for intestinal metaplasia, dsyplasia or H. pylori. normal gastric emptying study done 07/13/2013.  [name] GERD (gastroesophageal reflux disease)   . Morbid obesity ([place])   . Non-obstructive CAD    a. 02/2013 Cath ([place]): nonobs dzs;  b. 09/2014 Myoview ([place]): EF 55%, no ischemia;  c. Cath 05/2018 mild CAD no obstruction.  . Persistent atrial fibrillation    a. 02/2013 s/p rfca in [place], [place]-->prev on Xarelto, d/c'd 2/2 anemia, ? GIB.  [name] Rheumatoid arthritis ([place])   . Sigmoid diverticulosis 08/01/2013   confirmed on colonscopy. record scanned into chart    Past Surgical History:  Procedure Laterality  Date  . CARDIAC CATHETERIZATION  05/2014   ablation for atrial fibrillation  . CARDIAC CATHETERIZATION N/A 11/16/2015   Procedure: Left Heart Cath and Coronary Angiography;  Surgeon: Leonie Man, MD;  Location: [facility];  Service: Cardiovascular;  Laterality: N/A;  . COLON RESECTION  09/2013   due to large, abnormal polpy. non cancerous per patient.   . COLON SURGERY  09/2014   colon resection   . LEFT HEART CATH AND CORONARY ANGIOGRAPHY N/A 05/19/2018   Procedure: LEFT HEART CATH AND CORONARY ANGIOGRAPHY;  Surgeon: Wellington Hampshire, MD;  Location: [facility];  Service: Cardiovascular;  Laterality: N/A;    Social History   Socioeconomic History  . Marital status: Married    Spouse name: Amethyst  . Number of children: 8  . Years of education: 66  . Highest education level: Some college, no degree  Occupational History    Comment: disabled  Social Needs  . Financial resource strain: Hard  . Food insecurity:    Worry: Often true    Inability: Often true  . Transportation needs:    Medical: Yes    Non-medical: Yes  Tobacco Use  . Smoking status: Former Smoker    Packs/day: 0.50    Years: 10.00    Pack years: 5.00    Types: Cigarettes    Last attempt to quit: 04/11/2008    Years since quitting: 10.3  . Smokeless tobacco: Never Used  Substance and Sexual Activity  .  Alcohol use: No    Alcohol/week: 0.0 standard drinks  . Drug use: No  . Sexual activity: Yes    Partners: Female    Birth control/protection: None  Lifestyle  . Physical activity:    Days per week: Not on file    Minutes per session: Not on file  . Stress: Not on file  Relationships  . Social connections:    Talks on phone: Not on file    Gets together: Not on file    Attends religious service: Not on file    Active member of club or organization: Not on file    Attends meetings of clubs or organizations: Not on file    Relationship status: Not on file  . Intimate partner violence:      Fear of current or ex partner: Not on file    Emotionally abused: Not on file    Physically abused: Not on file    Forced sexual activity: Not on file  Other Topics Concern  . Not on file  Social History Narrative   Lives with wife. Does not work.  On disability.    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 Provided crisis assistance program information to assist with bills       Has issues getting food- gets $36/month in food stamps- already has list of food pantries but has not tried them- encouraged pt to try food pantries and reach out to clinic for help if needed    Current Outpatient Medications on File Prior to Visit  Medication Sig Dispense Refill  . ACCU-CHEK FASTCLIX LANCETS MISC USE AS DIRECTED TO  CHECK  BLOOD  SUGAR  3  TIMES  DAILY 102 each 11  . albuterol (PROVENTIL HFA;VENTOLIN HFA) 108 (90 Base) MCG/ACT inhaler Inhale 2 puffs into the lungs every 6 (six) hours as needed for wheezing or shortness of breath. 1 Inhaler 0  . allopurinol (ZYLOPRIM) 100 MG tablet Take 100 mg by mouth daily.    [name] amoxicillin (AMOXIL) 875 MG tablet Take 1 tablet (875 mg total) by mouth 2 (two) times daily. 20 tablet 0  . aspirin (EQ ASPIRIN ADULT LOW DOSE) 81 MG EC tablet Take 1 tablet (81 mg total) by mouth daily. Swallow whole. 90 tablet 3  . atorvastatin (LIPITOR) 40 MG tablet TAKE 1 TABLET BY MOUTH ONCE DAILY 90 tablet 0  . Blood Pressure KIT 1 each by Does not apply route 2 (two) times daily. 1 each 0  . carvedilol (COREG) 6.25 MG tablet Take 1 tablet (6.25 mg total) by mouth 2 (two) times daily with a meal. 60 tablet 3  . DULoxetine (CYMBALTA) 20 MG capsule Take 20 mg by mouth daily.    . famotidine (PEPCID) 20 MG tablet Take 1 tablet (20 mg total) by mouth 2 (two) times daily. 60 tablet 6  . furosemide (LASIX)  40 MG tablet Take 1 tablet (40 mg total) by mouth 2 (two) times daily. 180 tablet 3  . glucose blood (ACCU-CHEK AVIVA PLUS) test strip 1 each by Other route 2 (two) times daily. And lancets 2/day 200 each 3  . hydrALAZINE (APRESOLINE) 10 MG tablet Take 1 tablet (10 mg total) by mouth every 8 (eight) hours. 90 tablet 1  . HYDROcodone-acetaminophen (NORCO) 7.5-325 MG tablet Take  1 tablet by mouth 3 (three) times daily.  0  . isosorbide mononitrate (IMDUR) 30 MG 24 hr tablet Take 1 tablet (30 mg total) by mouth daily. 90 tablet 3  . mupirocin ointment (BACTROBAN) 2 % Apply 1 application topically 3 (three) times daily. 22 g 1  . Potassium Chloride ER 20 MEQ TBCR Take 20 mEq by mouth daily. 90 tablet 3  . predniSONE (DELTASONE) 10 MG tablet Take 1 tablet (10 mg total) by mouth daily. 30 tablet 1  . pregabalin (LYRICA) 100 MG capsule Take 1 capsule (100 mg total) by mouth 3 (three) times daily. 90 capsule 3  . sacubitril-valsartan (ENTRESTO) 49-51 MG Take 1 tablet by mouth 2 (two) times daily. 60 tablet 3  . Secukinumab (COSENTYX) 150 MG/ML SOSY Inject 150 mg into the skin every 28 (twenty-eight) days.     . traMADol (ULTRAM) 50 MG tablet Take 1 tablet (50 mg total) by mouth every 6 (six) hours as needed. 10 tablet 0  . triamcinolone (NASACORT) 55 MCG/ACT AERO nasal inhaler Place 2 sprays into the nose daily. 1 Inhaler 0  . zolpidem (AMBIEN CR) 6.25 MG CR tablet Take 1 tablet (6.25 mg total) by mouth at bedtime as needed for sleep. Each prescription to last 1 month. 30 tablet 3   No current facility-administered medications on file prior to visit.     Allergies  Allergen Reactions  . Acetaminophen     Family History  Problem Relation Age of Onset  . Hypertension Mother   . Diabetes Mother   . COPD Mother   . Lung cancer Mother        Smoker   . Hypertension Father   . Diabetes Father   . Heart Problems Father   . COPD Father   . Colon cancer Neg Hx   . Stomach cancer Neg Hx     BP  (!) 144/84 (BP Location: Right Arm, Patient Position: Sitting, Cuff Size: Normal)   Pulse 80   Ht '5\' 6"'$  (1.676 m)   Wt 231 lb 12.8 oz (105.1 kg)   SpO2 93%   BMI 37.41 kg/m    Review of Systems He denies hypoglycemia.      Objective:   Physical Exam VITAL SIGNS:  See vs page GENERAL: no distress Pulses: dorsalis pedis intact bilat.   MSK: no deformity of the feet CV: no leg edema Skin:  no ulcer on the feet.  normal color and temp on the feet. Neuro: sensation is intact to touch on the feet, but decreased from normal.   Ext: There is bilateral onychomycosis of the toenails.    Lab Results  Component Value Date   HGBA1C 8.2 (A) 08/17/2018   Lab Results  Component Value Date   CREATININE 1.33 (H) 07/22/2018   BUN 9 07/22/2018   NA 142 07/22/2018   K 3.7 07/22/2018   CL 105 07/22/2018   CO2 27 07/22/2018      Assessment & Plan:  HTN: is noted today.  Insulin-requiring type 2 DM: he needs increased rx.  Renal failure: in this setting, he prob does not need slower-acting qd insulin, but in the absence of cbg's later in the day, we don;t know for sure   Patient Instructions  Your blood pressure is high today.  Please see your primary care provider soon, to have it rechecked check your blood sugar twice a day.  vary the time of day when you check, between before the 3 meals, and at bedtime.  also check if you have symptoms of your blood sugar being too high or too low.  please keep a record of the readings and bring it to your next appointment here (or you can bring the meter itself).  You can write it on any piece of paper.  please call us sooner if your blood sugar goes below 70, or if you have a lot of readings over 200.   Please increase the insulin to 140 units each morning.  I have sent a prescription to your pharmacy.   On this type of insulin schedule, you should eat meals on a regular schedule (especially lunch).  If a meal is missed or significantly delayed, your  blood sugar could go low.  Please come back for a follow-up appointment in 2 months.

## 2018-08-19 ENCOUNTER — Inpatient Hospital Stay: Payer: Medicaid Other | Attending: Internal Medicine | Admitting: Internal Medicine

## 2018-08-25 ENCOUNTER — Ambulatory Visit: Payer: Medicaid Other | Admitting: Physical Therapy

## 2018-08-25 LAB — HM DIABETES EYE EXAM

## 2018-08-29 ENCOUNTER — Other Ambulatory Visit: Payer: Self-pay | Admitting: Internal Medicine

## 2018-08-29 DIAGNOSIS — G4700 Insomnia, unspecified: Secondary | ICD-10-CM

## 2018-08-30 NOTE — Telephone Encounter (Signed)
Pt last seen: 07/19/18 Next appt: 08/31/18 (with Dr. Joya Gaskins) Last RX written on: 04/29/18 Date of original fill: 04/29/18 Date of refill(s): 06/04/18, 07/02/18, 08/02/18  The following controlled substances were also filled during this time: Hydrocodone w/ APAP #90tabs/30ds written by Dr. Melina Fiddler on 07/23/18, filled on 08/02/18 Tramadol 50mg  #10tabs/3ds written by Dr. Valere Dross on 08/06/18, filled 08/06/18   Please refill if appropriate.

## 2018-08-30 NOTE — Progress Notes (Addendum)
Subjective:    Patient ID: Marco [name], male    DOB: April 19, 1956, 63 y.o.   MRN: [id number]  63 y.o.M with HTN, T2DM, CHF , CAF, CAD   Sees Endocrine Dr Loanne Drilling, just saw 08/17/18.  BP poorly controlled.     The patient is seen today as a work in complaining of increasing muscle twitching and jerking in the hands and stomach.  This is been waxing and waning and is actually been going on for 1 year.  There was a hospitalization early in 2019 for the similar condition was found to have acute renal failure and hypokalemia.  Note the patient does have sleep apnea and is on a full face CPAP mask.  The patient is requesting refills on his Pepcid and zolpidem   The patient's reflux disease is well controlled on Pepcid.  The patient does have diabetes and has been followed closely by his optometrist for diabetic retinopathy with a recent visit 2 days ago.  He also has neuropathy.  His lower extremities have been stable.  He has had a history of nonischemic cardiomyopathy and also a non-STEMI with hospitalizations in October.  A cardiac catheterization showed nonobstructive coronary disease.    Hypertension  This is a chronic problem. The current episode started more than 1 year ago. Associated symptoms include chest pain, malaise/fatigue and shortness of breath. Pertinent negatives include no blurred vision, orthopnea, palpitations, peripheral edema or PND. (Chest pain is ongoing tightness, does not radiate) There are no compliance problems.  Hypertensive end-organ damage includes kidney disease, CAD/MI and heart failure. There is no history of CVA or PVD. Identifiable causes of hypertension include chronic renal disease and sleep apnea.  Diabetes  Associated symptoms include chest pain. Pertinent negatives for diabetes include no blurred vision, no polydipsia, no polyphagia and no polyuria. Pertinent negatives for diabetic complications include no CVA or PVD.   Past Medical History:  Diagnosis Date  .  Benign colon polyp 08/01/2013   [facility] in [place]. large base tranverse colon polyp was biopsied. polyp was benign with minimal surface hyperplastic change.  . Chronic combined systolic and diastolic CHF (congestive heart failure) ([place])   . Chronic pain   . CKD (chronic kidney disease), stage III ([place])   . Diabetes mellitus without complication ([place])   . Diverticulosis   . Esophageal hiatal hernia 07/29/2013   confirmed on EGD   . Esophageal stricture   . Essential hypertension   . Gastritis 07/29/2013   confirmed on EGD, bx done an negative for intestinal metaplasia, dsyplasia or H. pylori. normal gastric emptying study done 07/13/2013.  [name] GERD (gastroesophageal reflux disease)   . Morbid obesity ([place])   . Non-obstructive CAD    a. 02/2013 Cath ([place]): nonobs dzs;  b. 09/2014 Myoview ([place]): EF 55%, no ischemia;  c. Cath 05/2018 mild CAD no obstruction.  . Persistent atrial fibrillation    a. 02/2013 s/p rfca in [place], [place]-->prev on Xarelto, d/c'd 2/2 anemia, ? GIB.  [name] Rheumatoid arthritis ([place])   . Sigmoid diverticulosis 08/01/2013   confirmed on colonscopy. record scanned into chart     Family History  Problem Relation Age of Onset  . Hypertension Mother   . Diabetes Mother   . COPD Mother   . Lung cancer Mother        Smoker   . Hypertension Father   . Diabetes Father   . Heart Problems Father   . COPD Father   . Colon cancer  Neg Hx   . Stomach cancer Neg Hx      Social History   Socioeconomic History  . Marital status: Married    Spouse name: Amethyst  . Number of children: 8  . Years of education: 44  . Highest education level: Some college, no degree  Occupational History    Comment: disabled  Social Needs  . Financial resource strain: Hard  . Food insecurity:    Worry: Often true    Inability: Often true  . Transportation needs:    Medical: Yes    Non-medical: Yes  Tobacco Use  . Smoking status: Former Smoker    Packs/day: 0.50     Years: 10.00    Pack years: 5.00    Types: Cigarettes    Last attempt to quit: 04/11/2008    Years since quitting: 10.3  . Smokeless tobacco: Never Used  Substance and Sexual Activity  . Alcohol use: No    Alcohol/week: 0.0 standard drinks  . Drug use: No  . Sexual activity: Yes    Partners: Female    Birth control/protection: None  Lifestyle  . Physical activity:    Days per week: Not on file    Minutes per session: Not on file  . Stress: Not on file  Relationships  . Social connections:    Talks on phone: Not on file    Gets together: Not on file    Attends religious service: Not on file    Active member of club or organization: Not on file    Attends meetings of clubs or organizations: Not on file    Relationship status: Not on file  . Intimate partner violence:    Fear of current or ex partner: Not on file    Emotionally abused: Not on file    Physically abused: Not on file    Forced sexual activity: Not on file  Other Topics Concern  . Not on file  Social History Narrative   Lives with wife. Does not work.  On disability.    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 Provided crisis assistance program information to assist with bills       Has issues getting food- gets $36/month in food stamps- already has list of food pantries but has not tried them- encouraged pt to try food pantries and reach out to clinic for help if needed     Allergies  Allergen Reactions  . Acetaminophen      Outpatient Medications Prior to Visit  Medication Sig Dispense Refill  . ACCU-CHEK FASTCLIX LANCETS MISC USE AS DIRECTED TO  CHECK  BLOOD  SUGAR  3  TIMES  DAILY 102 each 11  . albuterol (PROVENTIL HFA;VENTOLIN HFA) 108 (90 Base) MCG/ACT inhaler Inhale 2 puffs into the lungs every 6 (six) hours as needed for  wheezing or shortness of breath. 1 Inhaler 0  . allopurinol (ZYLOPRIM) 100 MG tablet Take 100 mg by mouth daily.    [name] aspirin (EQ ASPIRIN ADULT LOW DOSE) 81 MG EC tablet Take 1 tablet (81 mg total) by mouth daily. Swallow whole. 90 tablet 3  . atorvastatin (LIPITOR) 40 MG tablet TAKE 1 TABLET BY MOUTH ONCE DAILY 90 tablet 0  . Blood Pressure KIT 1 each by Does not apply route 2 (  two) times daily. 1 each 0  . carvedilol (COREG) 6.25 MG tablet Take 1 tablet (6.25 mg total) by mouth 2 (two) times daily with a meal. 60 tablet 3  . DULoxetine (CYMBALTA) 20 MG capsule TAKE 1 CAPSULE BY MOUTH ONCE DAILY 30 capsule 2  . furosemide (LASIX) 40 MG tablet Take 1 tablet (40 mg total) by mouth 2 (two) times daily. 180 tablet 3  . glucose blood (ACCU-CHEK AVIVA PLUS) test strip 1 each by Other route 2 (two) times daily. And lancets 2/day 200 each 3  . hydrALAZINE (APRESOLINE) 10 MG tablet Take 1 tablet (10 mg total) by mouth every 8 (eight) hours. 90 tablet 1  . HYDROcodone-acetaminophen (NORCO) 7.5-325 MG tablet Take 1 tablet by mouth 3 (three) times daily.  0  . insulin NPH Human (HUMULIN N) 100 UNIT/ML injection Inject 1.4 mLs (140 Units total) into the skin every morning. And syringes 2/day 50 mL 11  . isosorbide mononitrate (IMDUR) 30 MG 24 hr tablet Take 1 tablet (30 mg total) by mouth daily. 90 tablet 3  . mupirocin ointment (BACTROBAN) 2 % Apply 1 application topically 3 (three) times daily. 22 g 1  . Potassium Chloride ER 20 MEQ TBCR Take 20 mEq by mouth daily. 90 tablet 3  . pregabalin (LYRICA) 100 MG capsule Take 1 capsule (100 mg total) by mouth 3 (three) times daily. 90 capsule 3  . sacubitril-valsartan (ENTRESTO) 49-51 MG Take 1 tablet by mouth 2 (two) times daily. 60 tablet 3  . Secukinumab (COSENTYX) 150 MG/ML SOSY Inject 150 mg into the skin every 28 (twenty-eight) days.     [name] triamcinolone (NASACORT) 55 MCG/ACT AERO nasal inhaler Place 2 sprays into the nose daily. 1 Inhaler 0  . famotidine  (PEPCID) 20 MG tablet Take 1 tablet (20 mg total) by mouth 2 (two) times daily. 60 tablet 6  . zolpidem (AMBIEN CR) 6.25 MG CR tablet Take 1 tablet (6.25 mg total) by mouth at bedtime as needed for sleep. Each prescription to last 1 month. 30 tablet 3  . amoxicillin (AMOXIL) 875 MG tablet Take 1 tablet (875 mg total) by mouth 2 (two) times daily. 20 tablet 0  . predniSONE (DELTASONE) 10 MG tablet Take 1 tablet (10 mg total) by mouth daily. 30 tablet 1  . traMADol (ULTRAM) 50 MG tablet Take 1 tablet (50 mg total) by mouth every 6 (six) hours as needed. (Patient not taking: Reported on 08/31/2018) 10 tablet 0   No facility-administered medications prior to visit.        Review of Systems  Constitutional: Positive for malaise/fatigue.  HENT: Negative.   Eyes: Negative.  Negative for blurred vision.  Respiratory: Positive for chest tightness and shortness of breath. Negative for cough and wheezing.   Cardiovascular: Positive for chest pain. Negative for palpitations, orthopnea and PND.  Gastrointestinal: Negative.   Endocrine: Negative for polydipsia, polyphagia and polyuria.  Genitourinary: Negative.  Negative for dysuria and urgency.  Musculoskeletal: Negative.        Objective:   Physical Exam Vitals:   08/31/18 1019  BP: 123/69  Pulse: 75  Resp: 18  Temp: 98.5 F (36.9 C)  TempSrc: Oral  SpO2: 94%  Weight: 230 lb (104.3 kg)  Height: '5\' 6"'$  (1.676 m)    Gen: Pleasant, mildly obese  in no distress,  normal affect  ENT: No lesions,  mouth clear,  oropharynx clear, no postnasal drip  Neck: No JVD, no TMG, no carotid bruits  Lungs: No use  of accessory muscles, no dullness to percussion, clear without rales or rhonchi  Cardiovascular: RRR, heart sounds normal, no murmur or gallops, no peripheral edema  Abdomen: soft and NT, no HSM,  BS normal  Musculoskeletal: No deformities, no cyanosis or clubbing  Neuro: alert, non focal  Skin: Warm, no lesions or rashes  No  results found.  BMP Latest Ref Rng & Units 07/22/2018 07/13/2018 07/06/2018  Glucose 70 - 99 mg/dL 170(H) 254(H) 90  BUN 8 - 23 mg/dL '9 20 18  '$ Creatinine 0.61 - 1.24 mg/dL 1.33(H) 1.47(H) 1.40(H)  BUN/Creat Ratio 10 - 24 - 14 13  Sodium 135 - 145 mmol/L 142 141 147(H)  Potassium 3.5 - 5.1 mmol/L 3.7 4.0 3.8  Chloride 98 - 111 mmol/L 105 100 104  CO2 22 - 32 mmol/L '27 25 26  '$ Calcium 8.9 - 10.3 mg/dL 8.6(L) 8.6 9.2   CBC Latest Ref Rng & Units 05/20/2018 05/19/2018 05/19/2018  WBC 4.0 - 10.5 K/uL 7.9 - 10.2  Hemoglobin 13.0 - 17.0 g/dL 13.8 15.3 12.8(L)  Hematocrit 39.0 - 52.0 % 43.9 45.0 42.0  Platelets 150 - 400 K/uL 228 - 237   Lab Results  Component Value Date   CHOL 222 (H) 04/21/2018   CHOL 104 04/17/2017   CHOL 92 (L) 06/10/2016   Lab Results  Component Value Date   HDL 38 (L) 04/21/2018   HDL 36 (L) 04/17/2017   HDL 35 (L) 06/10/2016   Lab Results  Component Value Date   LDLCALC 148 (H) 04/21/2018   LDLCALC 33 04/17/2017   LDLCALC 39 06/10/2016   Lab Results  Component Value Date   TRIG 180 (H) 04/21/2018   TRIG 176 (H) 04/17/2017   TRIG 91 06/10/2016   Lab Results  Component Value Date   CHOLHDL 5.8 (H) 04/21/2018   CHOLHDL 2.9 04/17/2017   CHOLHDL 2.6 06/10/2016   No results found for: LDLDIRECT   HgbA1C 8.2 08/17/18 CBG today 196 Wt Readings from Last 3 Encounters:  08/31/18 230 lb (104.3 kg)  08/17/18 231 lb 12.8 oz (105.1 kg)  07/22/18 232 lb 12.8 oz (105.6 kg)   Cardiac Cath 05/2018  Ost 2nd Diag lesion is 35% stenosed.  Prox LAD to Mid LAD lesion is 20% stenosed.  Ost Cx to Prox Cx lesion is 10% stenosed.  There is severe left ventricular systolic dysfunction.  LV end diastolic pressure is moderately elevated.  The left ventricular ejection fraction is 25-35% by visual estimate.   1.  Mild nonobstructive coronary artery disease. 2.  Severely reduced LV systolic function with an EF of 25 to 30% with severe global hypokinesis. 3.   Moderately to severely elevated left ventricular end-diastolic pressure at 28 mmHg.  Recommendations: The patient has nonischemic cardiomyopathy and he is significantly volume overloaded.  I recommend diuresis with furosemide 40 mg intravenously twice daily. I reduced  hydralazine and added a small dose carvedilol and Entresto.     Assessment & Plan:  I personally reviewed all images and lab data in the [facility] system as well as any outside material available during this office visit and agree with the  radiology impressions.   Essential hypertension History of hypertension and note good control on current program   Chronic combined systolic and diastolic CHF (congestive heart failure) (HCC) History of combined systolic and diastolic heart failure stable at this time  Continue current combination of hydralazine, beta-blocker, furosemide, and Entresto  OSA (obstructive sleep apnea) Obstructive sleep apnea followed by Dr. Annamaria Boots  will maintain current program  Gastroesophageal reflux disease without esophagitis Gastroesophageal reflux disease and history of peptic ulcer disease will maintain Pepcid daily  Diabetic polyneuropathy associated with type 2 diabetes mellitus ([place]) Type 2 diabetes well controlled at this time Will recheck hemoglobin A1c at this visit  Diabetic retinopathy ([place]) Diabetic polyneuropathy and retinopathy The patient has ongoing follow-up with retinal specialist  Chronic kidney disease Chronic kidney disease We will follow-up basic metabolic panel Due to the twitching and muscle spasticity we will also obtain calcium magnesium and phosphorus levels  Muscle spasm Review chronic kidney disease assessment  Shortness of breath Note ambulatory saturations were normal today in the office and pulmonary has evaluated this patient previously.  There is a mild diffusion deficit and mild restriction on pulmonary functions from 2017.  The level of dyspnea has never  been fully explained.  The patient does have obstructive sleep apnea.  The patient does not qualify for oxygen therapy.  The patient has lost weight with his diet program.  I suspect residual dyspnea here is on the basis of chronic diastolic heart failure and restrictive defect on pulmonary functions   Careem was seen today for spasms.  Diagnoses and all orders for this visit:  Uncontrolled type 2 diabetes mellitus with diabetic neuropathy (HCC) -     Glucose (CBG) -     Basic Metabolic Panel -     Calcium; Future -     Magnesium; Future -     Phosphorus -     Magnesium -     Calcium  Insomnia, unspecified type -     zolpidem (AMBIEN CR) 6.25 MG CR tablet; Take 1 tablet (6.25 mg total) by mouth at bedtime as needed for sleep. Each prescription to last 1 month.  Gastroesophageal reflux disease without esophagitis -     famotidine (PEPCID) 20 MG tablet; Take 1 tablet (20 mg total) by mouth 2 (two) times daily.  Muscle spasm -     Basic Metabolic Panel -     Calcium; Future -     Magnesium; Future -     Phosphorus -     Magnesium -     Calcium  Stage 3 chronic kidney disease (HCC) -     Basic Metabolic Panel  Chronic combined systolic and diastolic CHF (congestive heart failure) ([place]) -     DG Chest 2 View; Future  Shortness of breath -     DG Chest 2 View; Future  Essential hypertension  OSA (obstructive sleep apnea)  Diabetic polyneuropathy associated with type 2 diabetes mellitus (HCC)  Mild nonproliferative diabetic retinopathy of both eyes without macular edema associated with type 2 diabetes mellitus ([place])

## 2018-08-31 ENCOUNTER — Encounter: Payer: Self-pay | Admitting: Critical Care Medicine

## 2018-08-31 ENCOUNTER — Ambulatory Visit: Payer: Medicaid Other | Attending: Critical Care Medicine | Admitting: Critical Care Medicine

## 2018-08-31 VITALS — BP 123/69 | HR 75 | Temp 98.5°F | Resp 18 | Ht 66.0 in | Wt 230.0 lb

## 2018-08-31 DIAGNOSIS — Z825 Family history of asthma and other chronic lower respiratory diseases: Secondary | ICD-10-CM | POA: Diagnosis not present

## 2018-08-31 DIAGNOSIS — Z888 Allergy status to other drugs, medicaments and biological substances status: Secondary | ICD-10-CM | POA: Diagnosis not present

## 2018-08-31 DIAGNOSIS — I10 Essential (primary) hypertension: Secondary | ICD-10-CM

## 2018-08-31 DIAGNOSIS — E1122 Type 2 diabetes mellitus with diabetic chronic kidney disease: Secondary | ICD-10-CM | POA: Insufficient documentation

## 2018-08-31 DIAGNOSIS — Z6837 Body mass index (BMI) 37.0-37.9, adult: Secondary | ICD-10-CM | POA: Diagnosis not present

## 2018-08-31 DIAGNOSIS — K219 Gastro-esophageal reflux disease without esophagitis: Secondary | ICD-10-CM | POA: Insufficient documentation

## 2018-08-31 DIAGNOSIS — E113293 Type 2 diabetes mellitus with mild nonproliferative diabetic retinopathy without macular edema, bilateral: Secondary | ICD-10-CM | POA: Diagnosis not present

## 2018-08-31 DIAGNOSIS — Z79899 Other long term (current) drug therapy: Secondary | ICD-10-CM | POA: Insufficient documentation

## 2018-08-31 DIAGNOSIS — E1140 Type 2 diabetes mellitus with diabetic neuropathy, unspecified: Secondary | ICD-10-CM

## 2018-08-31 DIAGNOSIS — Z833 Family history of diabetes mellitus: Secondary | ICD-10-CM | POA: Insufficient documentation

## 2018-08-31 DIAGNOSIS — G4700 Insomnia, unspecified: Secondary | ICD-10-CM

## 2018-08-31 DIAGNOSIS — Z8711 Personal history of peptic ulcer disease: Secondary | ICD-10-CM | POA: Diagnosis not present

## 2018-08-31 DIAGNOSIS — N183 Chronic kidney disease, stage 3 unspecified: Secondary | ICD-10-CM

## 2018-08-31 DIAGNOSIS — M069 Rheumatoid arthritis, unspecified: Secondary | ICD-10-CM | POA: Diagnosis not present

## 2018-08-31 DIAGNOSIS — Z7982 Long term (current) use of aspirin: Secondary | ICD-10-CM | POA: Diagnosis not present

## 2018-08-31 DIAGNOSIS — Z7951 Long term (current) use of inhaled steroids: Secondary | ICD-10-CM | POA: Diagnosis not present

## 2018-08-31 DIAGNOSIS — E1165 Type 2 diabetes mellitus with hyperglycemia: Secondary | ICD-10-CM | POA: Diagnosis not present

## 2018-08-31 DIAGNOSIS — R0602 Shortness of breath: Secondary | ICD-10-CM | POA: Diagnosis not present

## 2018-08-31 DIAGNOSIS — M62838 Other muscle spasm: Secondary | ICD-10-CM | POA: Insufficient documentation

## 2018-08-31 DIAGNOSIS — I5042 Chronic combined systolic (congestive) and diastolic (congestive) heart failure: Secondary | ICD-10-CM

## 2018-08-31 DIAGNOSIS — Z8249 Family history of ischemic heart disease and other diseases of the circulatory system: Secondary | ICD-10-CM | POA: Insufficient documentation

## 2018-08-31 DIAGNOSIS — IMO0002 Reserved for concepts with insufficient information to code with codable children: Secondary | ICD-10-CM

## 2018-08-31 DIAGNOSIS — Z794 Long term (current) use of insulin: Secondary | ICD-10-CM | POA: Insufficient documentation

## 2018-08-31 DIAGNOSIS — I130 Hypertensive heart and chronic kidney disease with heart failure and stage 1 through stage 4 chronic kidney disease, or unspecified chronic kidney disease: Secondary | ICD-10-CM | POA: Insufficient documentation

## 2018-08-31 DIAGNOSIS — E1142 Type 2 diabetes mellitus with diabetic polyneuropathy: Secondary | ICD-10-CM | POA: Diagnosis not present

## 2018-08-31 DIAGNOSIS — Z87891 Personal history of nicotine dependence: Secondary | ICD-10-CM | POA: Diagnosis not present

## 2018-08-31 DIAGNOSIS — G4733 Obstructive sleep apnea (adult) (pediatric): Secondary | ICD-10-CM | POA: Diagnosis not present

## 2018-08-31 LAB — GLUCOSE, POCT (MANUAL RESULT ENTRY): POC Glucose: 196 mg/dl — AB (ref 70–99)

## 2018-08-31 MED ORDER — FAMOTIDINE 20 MG PO TABS: 20.0000 mg | ORAL_TABLET | Freq: Two times a day (BID) | ORAL | 6 refills | Status: DC

## 2018-08-31 MED ORDER — ZOLPIDEM TARTRATE ER 6.25 MG PO TBCR: 6.2500 mg | EXTENDED_RELEASE_TABLET | Freq: Every evening | ORAL | 3 refills | Status: DC | PRN

## 2018-08-31 NOTE — Assessment & Plan Note (Signed)
History of combined systolic and diastolic heart failure stable at this time  Continue current combination of hydralazine, beta-blocker, furosemide, and Entresto

## 2018-08-31 NOTE — Patient Instructions (Addendum)
Refills on Pepcid and Ambien were sent to your [place] today will be checked to include your kidney function and other electrolytes  A chest x-ray was ordered  We will call you with results of the labs and chest x-ray Keep your next scheduled primary care visit Please follow-up with an eye exam for your diabetes

## 2018-08-31 NOTE — Assessment & Plan Note (Signed)
Review chronic kidney disease assessment

## 2018-08-31 NOTE — Assessment & Plan Note (Signed)
Obstructive sleep apnea followed by Dr. Annamaria Boots will maintain current program

## 2018-08-31 NOTE — Assessment & Plan Note (Signed)
History of hypertension and note good control on current program

## 2018-08-31 NOTE — Assessment & Plan Note (Signed)
Gastroesophageal reflux disease and history of peptic ulcer disease will maintain Pepcid daily

## 2018-08-31 NOTE — Assessment & Plan Note (Signed)
Diabetic polyneuropathy and retinopathy The patient has ongoing follow-up with retinal specialist

## 2018-08-31 NOTE — Assessment & Plan Note (Signed)
Note ambulatory saturations were normal today in the office and pulmonary has evaluated this patient previously.  There is a mild diffusion deficit and mild restriction on pulmonary functions from 2017.  The level of dyspnea has never been fully explained.  The patient does have obstructive sleep apnea.  The patient does not qualify for oxygen therapy.  The patient has lost weight with his diet program.  I suspect residual dyspnea here is on the basis of chronic diastolic heart failure and restrictive defect on pulmonary functions

## 2018-08-31 NOTE — Assessment & Plan Note (Signed)
Chronic kidney disease We will follow-up basic metabolic panel Due to the twitching and muscle spasticity we will also obtain calcium magnesium and phosphorus levels

## 2018-08-31 NOTE — Assessment & Plan Note (Signed)
Type 2 diabetes well controlled at this time Will recheck hemoglobin A1c at this visit

## 2018-09-01 ENCOUNTER — Telehealth: Payer: Self-pay | Admitting: Internal Medicine

## 2018-09-01 LAB — BASIC METABOLIC PANEL
BUN/Creatinine Ratio: 12 (ref 10–24)
BUN: 19 mg/dL (ref 8–27)
CO2: 23 mmol/L (ref 20–29)
Calcium: 8.9 mg/dL (ref 8.6–10.2)
Chloride: 101 mmol/L (ref 96–106)
Creatinine, Ser: 1.59 mg/dL — ABNORMAL HIGH (ref 0.76–1.27)
GFR calc Af Amer: 53 mL/min/{1.73_m2} — ABNORMAL LOW (ref >59)
GFR calc non Af Amer: 46 mL/min/{1.73_m2} — ABNORMAL LOW (ref >59)
Glucose: 180 mg/dL — ABNORMAL HIGH (ref 65–99)
Potassium: 3.7 mmol/L (ref 3.5–5.2)
Sodium: 141 mmol/L (ref 134–144)

## 2018-09-01 LAB — MAGNESIUM: MAGNESIUM: 2.3 mg/dL (ref 1.6–2.3)

## 2018-09-01 LAB — PHOSPHORUS: Phosphorus: 3.7 mg/dL (ref 2.8–4.1)

## 2018-09-01 NOTE — Telephone Encounter (Signed)
Pt called stating his pharmacy is requesting Prior-Auth on -zolpidem (AMBIEN CR) 6.25 MG CR tablet  Please follow up as soon as possible

## 2018-09-01 NOTE — Telephone Encounter (Signed)
-----   Message from Elsie Stain, MD sent at 09/01/2018  8:18 AM EST ----- [place], let the patient know his labs are all normal. His calcium, magnesium and phosphorous levels normal, potassium is ok, kidney function is stable

## 2018-09-01 NOTE — Telephone Encounter (Signed)
Medical Assistant left message on patient's home and cell voicemail. Voicemail states to give a call back to [place] with [facility] at [phone]. Patient Is aware of all labs being normal and stable. PA request is being routed to pharmacy tech.

## 2018-09-02 ENCOUNTER — Other Ambulatory Visit: Payer: Self-pay

## 2018-09-02 NOTE — Telephone Encounter (Signed)
PA approved thru 03/01/19. Pharmacy was notified, they also let me know he needed a PA for Lyrica, which was completed and also approved thru 03/01/19.

## 2018-09-05 ENCOUNTER — Emergency Department (HOSPITAL_COMMUNITY)
Admission: EM | Admit: 2018-09-05 | Discharge: 2018-09-05 | Disposition: A | Discharge status: Home / Self Care | Payer: Medicaid Other | Attending: Emergency Medicine | Admitting: Emergency Medicine

## 2018-09-05 ENCOUNTER — Emergency Department (HOSPITAL_COMMUNITY)
Admission: EM | Admit: 2018-09-05 | Discharge: 2018-09-05 | Disposition: A | Discharge status: Home / Self Care | Payer: Medicaid Other | Attending: Critical Care Medicine | Admitting: Critical Care Medicine

## 2018-09-05 ENCOUNTER — Other Ambulatory Visit: Payer: Self-pay

## 2018-09-05 ENCOUNTER — Encounter (HOSPITAL_COMMUNITY): Payer: Self-pay | Admitting: Emergency Medicine

## 2018-09-05 DIAGNOSIS — R0602 Shortness of breath: Secondary | ICD-10-CM

## 2018-09-05 DIAGNOSIS — Z5321 Procedure and treatment not carried out due to patient leaving prior to being seen by health care provider: Secondary | ICD-10-CM | POA: Insufficient documentation

## 2018-09-05 DIAGNOSIS — I5042 Chronic combined systolic (congestive) and diastolic (congestive) heart failure: Secondary | ICD-10-CM

## 2018-09-05 NOTE — ED Triage Notes (Addendum)
Pt states needds f/u chest ray  States dr ordered one and he has to find out if he has fluid in chest denies cp but states is always a little sob. adamant that all he needs is chest xay

## 2018-09-06 ENCOUNTER — Telehealth: Payer: Self-pay | Admitting: *Deleted

## 2018-09-06 NOTE — Telephone Encounter (Signed)
MA unable to reach patient. Please inform patient of xray being normal and showing no heart concerns. Follow up as planned.

## 2018-09-06 NOTE — Telephone Encounter (Signed)
-----   Message from Elsie Stain, MD sent at 09/05/2018  2:21 PM EST ----- [place]. pls call the pt and let him know CXR was neg for any active process and was normal

## 2018-09-12 NOTE — Progress Notes (Signed)
Cardiology Office Note:    Date:  09/13/2018   ID:  Vitaliy Ammirati, DOB 05/12/1956, MRN [id number]  PCP:  Johnson, Deborah B, MD  Cardiologist:  Traci Turner, MD    Referring MD: Johnson, Deborah B, MD   Chief Complaint  Patient presents with  . Congestive Heart Failure  . Atrial Fibrillation  . Sleep Apnea  . Hypertension  . Hyperlipidemia    History of Present Illness:    Marco [name] is a 62 y.o. male with a hx of combined systolic and diastolic heart failure, nonischemic cardiomyopathy, mild nonobstructive coronary artery disease by cardiac catheterization in 2017, persistent atrial fibrillation with prior history of ablation in [place] in 2017, diabetes, hypertension, chronic kidney disease, sleep apnea, obesity.  His EF was 45-50 but improved to normal by Echo in 01/2017.CHADS2-VASc=4 (CHF, DM, CAD, HTN).Anticoagulation for PAF has been discontinued in the past secondary to anemia. He has a history of chronic dyspnea and chronic chest discomfort. Evaluation by pulmonology included a cardiopulmonary stress test in 2017. However, this was not conclusive due to submaximal effort.   He was seen in August 2019 for worsening shortness of breath.  An echocardiogram in September 2019 demonstrated worsening LV function with an EF of 35-40%.    Coronary CTA was planned however, the patient was admitted in October 2019 with chest pain and mildly elevated troponin.  Echocardiogram demonstrated EF 25-30%.  Cardiac catheterization demonstrated mild plaque without obstructive disease.  CHF medications were adjusted for nonischemic cardiomyopathy.  He was evaluated by Dr. Bensimhon in the advanced heart failure clinic in October 2019.  PYP scan was strongly suggestive of transthyretin amyloidosis.   He is here today for followup and is doing well.  He denies any chest pain or pressure, SOB, DOE, PND, orthopnea, LE edema, dizziness, palpitations or syncope. He is compliant with his meds and  is tolerating meds with no SE.    Past Medical History:  Diagnosis Date  . Benign colon polyp 08/01/2013   [facility] in [place]. large base tranverse colon polyp was biopsied. polyp was benign with minimal surface hyperplastic change.  . Chronic combined systolic and diastolic CHF (congestive heart failure) (HCC)   . Chronic pain   . CKD (chronic kidney disease), stage III (HCC)   . Diabetes mellitus without complication (HCC)   . Diverticulosis   . Esophageal hiatal hernia 07/29/2013   confirmed on EGD   . Esophageal stricture   . Essential hypertension   . Gastritis 07/29/2013   confirmed on EGD, bx done an negative for intestinal metaplasia, dsyplasia or H. pylori. normal gastric emptying study done 07/13/2013.  . GERD (gastroesophageal reflux disease)   . Morbid obesity (HCC)   . Non-obstructive CAD    a. 02/2013 Cath ([place]): nonobs dzs;  b. 09/2014 Myoview ([place]): EF 55%, no ischemia;  c. Cath 05/2018 mild CAD no obstruction.  . Persistent atrial fibrillation    a. 02/2013 s/p rfca in [place], [place]-->prev on Xarelto, d/c'd 2/2 anemia, ? GIB.  . Rheumatoid arthritis (HCC)   . Sigmoid diverticulosis 08/01/2013   confirmed on colonscopy. record scanned into chart    Past Surgical History:  Procedure Laterality Date  . CARDIAC CATHETERIZATION  05/2014   ablation for atrial fibrillation  . CARDIAC CATHETERIZATION N/A 11/16/2015   Procedure: Left Heart Cath and Coronary Angiography;  Surgeon: David W Harding, MD;  Location: [facility] INVASIVE CV LAB;  Service: Cardiovascular;  Laterality: N/A;  . COLON RESECTION  09/2013     due to large, abnormal polpy. non cancerous per patient.   . COLON SURGERY  09/2014   colon resection   . LEFT HEART CATH AND CORONARY ANGIOGRAPHY N/A 05/19/2018   Procedure: LEFT HEART CATH AND CORONARY ANGIOGRAPHY;  Surgeon: Arida, Muhammad A, MD;  Location: [facility] INVASIVE CV LAB;  Service: Cardiovascular;  Laterality: N/A;    Current Medications: Current  Meds  Medication Sig  . ACCU-CHEK FASTCLIX LANCETS MISC USE AS DIRECTED TO  CHECK  BLOOD  SUGAR  3  TIMES  DAILY  . albuterol (PROVENTIL HFA;VENTOLIN HFA) 108 (90 Base) MCG/ACT inhaler Inhale 2 puffs into the lungs every 6 (six) hours as needed for wheezing or shortness of breath.  . allopurinol (ZYLOPRIM) 100 MG tablet Take 100 mg by mouth daily.  . aspirin (EQ ASPIRIN ADULT LOW DOSE) 81 MG EC tablet Take 1 tablet (81 mg total) by mouth daily. Swallow whole.  . atorvastatin (LIPITOR) 40 MG tablet TAKE 1 TABLET BY MOUTH ONCE DAILY  . Blood Pressure KIT 1 each by Does not apply route 2 (two) times daily.  . carvedilol (COREG) 6.25 MG tablet Take 1 tablet (6.25 mg total) by mouth 2 (two) times daily with a meal.  . DULoxetine (CYMBALTA) 20 MG capsule TAKE 1 CAPSULE BY MOUTH ONCE DAILY  . famotidine (PEPCID) 20 MG tablet Take 1 tablet (20 mg total) by mouth 2 (two) times daily.  . furosemide (LASIX) 40 MG tablet Take 1 tablet (40 mg total) by mouth 2 (two) times daily.  . glucose blood (ACCU-CHEK AVIVA PLUS) test strip 1 each by Other route 2 (two) times daily. And lancets 2/day  . hydrALAZINE (APRESOLINE) 10 MG tablet Take 1 tablet (10 mg total) by mouth every 8 (eight) hours.  . HYDROcodone-acetaminophen (NORCO) 7.5-325 MG tablet Take 1 tablet by mouth 3 (three) times daily.  . insulin NPH Human (HUMULIN N) 100 UNIT/ML injection Inject 1.4 mLs (140 Units total) into the skin every morning. And syringes 2/day  . isosorbide mononitrate (IMDUR) 30 MG 24 hr tablet Take 1 tablet (30 mg total) by mouth daily.  . Potassium Chloride ER 20 MEQ TBCR Take 20 mEq by mouth daily.  . predniSONE (DELTASONE) 10 MG tablet Take 10 mg by mouth daily with breakfast.  . pregabalin (LYRICA) 100 MG capsule Take 1 capsule (100 mg total) by mouth 3 (three) times daily.  . sacubitril-valsartan (ENTRESTO) 49-51 MG Take 1 tablet by mouth 2 (two) times daily.  . Secukinumab (COSENTYX) 150 MG/ML SOSY Inject 150 mg into the  skin every 28 (twenty-eight) days.   . zolpidem (AMBIEN CR) 6.25 MG CR tablet Take 1 tablet (6.25 mg total) by mouth at bedtime as needed for sleep. Each prescription to last 1 month.     Allergies:   Acetaminophen   Social History   Socioeconomic History  . Marital status: Married    Spouse name: Amethyst  . Number of children: 8  . Years of education: 13  . Highest education level: Some college, no degree  Occupational History    Comment: disabled  Social Needs  . Financial resource strain: Hard  . Food insecurity:    Worry: Often true    Inability: Often true  . Transportation needs:    Medical: Yes    Non-medical: Yes  Tobacco Use  . Smoking status: Former Smoker    Packs/day: 0.50    Years: 10.00    Pack years: 5.00    Types: Cigarettes    Last attempt   to quit: 04/11/2008    Years since quitting: 10.4  . Smokeless tobacco: Never Used  Substance and Sexual Activity  . Alcohol use: No    Alcohol/week: 0.0 standard drinks  . Drug use: No  . Sexual activity: Yes    Partners: Female    Birth control/protection: None  Lifestyle  . Physical activity:    Days per week: Not on file    Minutes per session: Not on file  . Stress: Not on file  Relationships  . Social connections:    Talks on phone: Not on file    Gets together: Not on file    Attends religious service: Not on file    Active member of club or organization: Not on file    Attends meetings of clubs or organizations: Not on file    Relationship status: Not on file  Other Topics Concern  . Not on file  Social History Narrative   Lives with wife. Does not work.  On disability.    Caffeine- coffee, 1/2 cup daily      10/31- gets retirement and workmans comp from old job in [place]- has trouble paying for utilities consistently- had water turned off but paid it on credit and now owes money on his card... Provided crisis assistance program information to assist with bills       Has issues getting food- gets  $36/month in food stamps- already has list of food pantries but has not tried them- encouraged pt to try food pantries and reach out to clinic for help if needed     Family History: The patient's  family history includes COPD in his father and mother; Diabetes in his father and mother; Heart Problems in his father; Hypertension in his father and mother; Lung cancer in his mother. There is no history of Colon cancer or Stomach cancer.  ROS:   Please see the history of present illness.    ROS  All other systems reviewed and negative.   EKGs/Labs/Other Studies Reviewed:    The following studies were reviewed today: none  EKG:  EKG is not ordered today.    Recent Labs: 01/29/2018: TSH 1.370 04/21/2018: ALT 23 05/20/2018: Hemoglobin 13.8; Platelets 228 06/10/2018: B Natriuretic Peptide 439.4 07/06/2018: NT-Pro BNP 624 08/31/2018: BUN 19; Creatinine, Ser 1.59; Magnesium 2.3; Potassium 3.7; Sodium 141   Recent Lipid Panel    Component Value Date/Time   CHOL 222 (H) 04/21/2018 0726   TRIG 180 (H) 04/21/2018 0726   HDL 38 (L) 04/21/2018 0726   CHOLHDL 5.8 (H) 04/21/2018 0726   CHOLHDL 2.6 06/10/2016 1253   VLDL 18 06/10/2016 1253   LDLCALC 148 (H) 04/21/2018 0726    Physical Exam:    VS:  BP 114/70   Pulse 85   Ht 5' 6" (1.676 m)   Wt 225 lb (102.1 kg)   SpO2 94%   BMI 36.32 kg/m     Wt Readings from Last 3 Encounters:  09/13/18 225 lb (102.1 kg)  08/31/18 230 lb (104.3 kg)  08/17/18 231 lb 12.8 oz (105.1 kg)     GEN: Well nourished, well developed in no acute distress HEENT: Normal NECK: No JVD; No carotid bruits LYMPHATICS: No lymphadenopathy CARDIAC: RRR, no murmurs, rubs, gallops RESPIRATORY:  Clear to auscultation without rales, wheezing or rhonchi  ABDOMEN: Soft, non-tender, non-distended MUSCULOSKELETAL:  No edema; No deformity  SKIN: Warm and dry NEUROLOGIC:  Alert and oriented x 3 PSYCHIATRIC:  Normal affect   ASSESSMENT:      1. Chronic combined  systolic and diastolic CHF (congestive heart failure) (HCC)   2. Essential hypertension   3. Coronary artery disease involving native coronary artery of native heart without angina pectoris   4. Paroxysmal atrial fibrillation (HCC)   5. NICM (nonischemic cardiomyopathy) (HCC)   6. OSA (obstructive sleep apnea)   7. Pure hypercholesterolemia    PLAN:    In order of problems listed above:  1.  Chronic combined systolic/diastolic CHF - he has NYHA class III symptoms.  He does not appear significantly volume overloaded on exam today.  His weight as been fairly stable.  He will continue on Hydralazine 10mg q8hrs, Imur 30mg daily, Entresto 49-51mg BID and lasix 40mg BID.  His heart rate is 85 bpm today so I am going to increase his carvedilol to 12.5 mg twice daily.  He has a follow-up with AHF in a few weeks.  His creatinine was 1.59 on 08/31/2018 on a and potassium 3.7.  2.  HTN -  BP is well controlled on exam today.  He will continue on Hydralazine 10mg q 8 hrs and Entresto 49-51mg BID.  As stated above we will increase carvedilol to 12.5 mg twice daily to try to get better control of heart rate.  Again stressed fluid restriction < 2L daily and low sodium.  Creatinine was 1.59 and potassium 3.7 on 08/31/2018.  3.  ASCAD - mild nonosbtructive CAD by cath 2017.  He will continue on  BB, Imdur, ASA.  4.   PAF - s/p RFCA in [place] in 2017.  He is maintaining NSR on exam today.  He will continue on Carvedilol.  Xarelto stopped due to anemia.  CHADS2VASC score 4.    5.  NIDCM - nonischemic with EF 25-30% by echo 05/2018.  PYP scan very positive for TTR amyloid.  cMRI contraindicated due to CKD. SPEP c/w  Renal dz.  He is on maximum therapy for HF that BP and renal function will allow with no significant improvement in LVF.  Will refer to EP for consideration of AICD (not candidate for CRT due to narrow QRS).  6.  OSA - the patient is tolerating PAP therapy well without any problems.  I will get a download  from his DME.  The patient has been using and benefiting from PAP use and will continue to benefit from therapy.   7.  Hyperlipidemia - LDL goal is < 70.  His LDL was 148 on 04/21/2018.  I am going to repeat an FLP and ALT and if LDL not at goal increase atorvastatin.  8.  CKD - last creatinine 1.59    Medication Adjustments/Labs and Tests Ordered: Current medicines are reviewed at length with the patient today.  Concerns regarding medicines are outlined above.  No orders of the defined types were placed in this encounter.  No orders of the defined types were placed in this encounter.   Signed, Traci Turner, MD  09/13/2018 10:28 AM    [facility] 

## 2018-09-13 ENCOUNTER — Ambulatory Visit: Payer: Medicaid Other | Admitting: Cardiology

## 2018-09-13 ENCOUNTER — Encounter: Payer: Self-pay | Admitting: Cardiology

## 2018-09-13 VITALS — BP 114/70 | HR 85 | Ht 66.0 in | Wt 225.0 lb

## 2018-09-13 DIAGNOSIS — G4733 Obstructive sleep apnea (adult) (pediatric): Secondary | ICD-10-CM

## 2018-09-13 DIAGNOSIS — I2510 Atherosclerotic heart disease of native coronary artery without angina pectoris: Secondary | ICD-10-CM

## 2018-09-13 DIAGNOSIS — I480 Paroxysmal atrial fibrillation: Secondary | ICD-10-CM

## 2018-09-13 DIAGNOSIS — E7800 Pure hypercholesterolemia, unspecified: Secondary | ICD-10-CM

## 2018-09-13 DIAGNOSIS — I10 Essential (primary) hypertension: Secondary | ICD-10-CM

## 2018-09-13 DIAGNOSIS — I5042 Chronic combined systolic (congestive) and diastolic (congestive) heart failure: Secondary | ICD-10-CM

## 2018-09-13 DIAGNOSIS — I428 Other cardiomyopathies: Secondary | ICD-10-CM

## 2018-09-13 LAB — HEPATIC FUNCTION PANEL
ALT: 21 IU/L (ref 0–44)
AST: 11 IU/L (ref 0–40)
Albumin: 3.9 g/dL (ref 3.8–4.8)
Alkaline Phosphatase: 69 IU/L (ref 39–117)
Bilirubin Total: 0.3 mg/dL (ref 0.0–1.2)
Bilirubin, Direct: 0.12 mg/dL (ref 0.00–0.40)
TOTAL PROTEIN: 7 g/dL (ref 6.0–8.5)

## 2018-09-13 LAB — LIPID PANEL
Chol/HDL Ratio: 6.8 ratio — ABNORMAL HIGH (ref 0.0–5.0)
Cholesterol, Total: 218 mg/dL — ABNORMAL HIGH (ref 100–199)
HDL: 32 mg/dL — ABNORMAL LOW (ref >39)
LDL Calculated: 143 mg/dL — ABNORMAL HIGH (ref 0–99)
Triglycerides: 214 mg/dL — ABNORMAL HIGH (ref 0–149)
VLDL Cholesterol Cal: 43 mg/dL — ABNORMAL HIGH (ref 5–40)

## 2018-09-13 MED ORDER — CARVEDILOL 12.5 MG PO TABS: 12.5000 mg | ORAL_TABLET | Freq: Two times a day (BID) | ORAL | 3 refills | Status: DC

## 2018-09-13 NOTE — Patient Instructions (Signed)
Medication Instructions:  Increase Carvedilol to 12.5 mg , twice a day, by mouth  If you need a refill on your cardiac medications before your next appointment, please call your pharmacy.   Lab work: Today: LFT and Lipids  If you have labs (blood work) drawn today and your tests are completely normal, you will receive your results only by: [name] MyChart Message (if you have MyChart) OR . A paper copy in the mail If you have any lab test that is abnormal or we need to change your treatment, we will call you to review the results.  Testing/Procedures: None  Follow-Up: At [facility], you and your health needs are our priority.  As part of our continuing mission to provide you with exceptional heart care, we have created designated Provider Care Teams.  These Care Teams include your primary Cardiologist (physician) and Advanced Practice Providers (APPs -  Physician Assistants and Nurse Practitioners) who all work together to provide you with the care you need, when you need it. You will need a follow up appointment in 6 months.  Please call our office 2 months in advance to schedule this appointment.  You may see [name], MD or one of the following Advanced Practice Providers on your designated Care Team:   [place], [name] [name], [name] . Ermalinda Barrios, [name]

## 2018-09-14 ENCOUNTER — Telehealth: Payer: Self-pay | Admitting: Cardiology

## 2018-09-14 DIAGNOSIS — I2510 Atherosclerotic heart disease of native coronary artery without angina pectoris: Secondary | ICD-10-CM

## 2018-09-14 DIAGNOSIS — E7800 Pure hypercholesterolemia, unspecified: Secondary | ICD-10-CM

## 2018-09-14 MED ORDER — ATORVASTATIN CALCIUM 40 MG PO TABS: 40.0000 mg | ORAL_TABLET | Freq: Every day | ORAL | 3 refills | Status: DC

## 2018-09-14 NOTE — Telephone Encounter (Signed)
Spoke with the patient, he has not been taking atorvastatin, he thought it was stopped before. Should we just reorder at 40 mg?

## 2018-09-14 NOTE — Telephone Encounter (Signed)
Notes recorded by Sueanne Margarita, MD on 09/13/2018 at 5:25 PM EST LDL not at goal - increase atorvastatin to 80mg  daily. TAGs elevated but he had oatmeal for breakfast. Repeat FLP and ALT in 6 weeks

## 2018-09-14 NOTE — Telephone Encounter (Signed)
Yes and repeat FLP and ALT in 6 weeks

## 2018-09-14 NOTE — Telephone Encounter (Signed)
New Message   PT is returning phone call for [facility]

## 2018-09-16 NOTE — Telephone Encounter (Signed)
Spoke with the patient, he accepted the fasting labs, he is scheduled on 11/01/18. He had no further questions.

## 2018-09-17 ENCOUNTER — Telehealth: Payer: Self-pay | Admitting: *Deleted

## 2018-09-17 ENCOUNTER — Telehealth: Payer: Self-pay | Admitting: Internal Medicine

## 2018-09-17 NOTE — Telephone Encounter (Signed)
-----   Message from Sarina Ill, RN sent at 09/13/2018 11:09 AM EST ----- Regarding: Sleep Hello, [name] wanted a download for the patient.  Thanks, [employer]

## 2018-09-17 NOTE — Telephone Encounter (Signed)
Download printed and placed in Dr Landis Gandy folder.

## 2018-09-20 ENCOUNTER — Telehealth: Payer: Self-pay | Admitting: *Deleted

## 2018-09-20 ENCOUNTER — Telehealth (HOSPITAL_COMMUNITY): Payer: Self-pay

## 2018-09-20 MED ORDER — TAFAMIDIS 61 MG PO CAPS: 61.0000 mg | ORAL_CAPSULE | Freq: Every day | ORAL | 11 refills | Status: DC

## 2018-09-20 NOTE — Telephone Encounter (Signed)
Pt called office back, aware that [employer] will call him to discuss copay/benefits and delivery.

## 2018-09-20 NOTE — Telephone Encounter (Signed)
Informed patient of compliance results and he understands it showed increased apneas and a possible leak. He understands he should follow up with his DME to have his mask checked and that he should not sleep on his back. He understands another download will be pulled 2 weeks after his mask check

## 2018-09-20 NOTE — Telephone Encounter (Signed)
PA called into [employer], per company patient did not require prior auth.  [employer] made aware of same. Per [employer] rep, Rx on file and they will call patient to discuss co-pay and arrange delivery. LM on patient VM with same information and to call office if he has any questions. Phone number left on VM.

## 2018-09-29 NOTE — Telephone Encounter (Signed)
Per Joanne's documentation, this is an erroneous encounter

## 2018-10-01 ENCOUNTER — Ambulatory Visit (INDEPENDENT_AMBULATORY_CARE_PROVIDER_SITE_OTHER): Payer: Medicaid Other | Admitting: Internal Medicine

## 2018-10-01 ENCOUNTER — Ambulatory Visit: Payer: Medicaid Other | Admitting: Internal Medicine

## 2018-10-01 ENCOUNTER — Encounter: Payer: Self-pay | Admitting: Internal Medicine

## 2018-10-01 VITALS — BP 120/68 | HR 65 | Ht 66.0 in | Wt 234.0 lb

## 2018-10-01 DIAGNOSIS — G4733 Obstructive sleep apnea (adult) (pediatric): Secondary | ICD-10-CM

## 2018-10-01 NOTE — Patient Instructions (Signed)
Order- DME [facility]- please reduce VPAP range to 14/ 11, PS  2, continue mask of choice humidifier, supplies, AirView/ card  If this pressure change doesn't help, please let us know

## 2018-10-01 NOTE — Progress Notes (Signed)
HPI male former smoker followed for OSA/ Insomnia, dyspnea on exertion, complicated by chronic pain, anxiety, DM 2, history cardiac ablation, gout NPSG 37.8/hour on 06/14/2015, desaturation to 76%, body weight 223 pounds BiPAP titration 08/19/2015- to 20/16 Barium Swallow 02/07/2016-normal CXR 01/22/2016-NAD PFT 02/22/2016-minimal restriction, mild diffusion deficit. FVC 2.62/76%, FEV1 2.14/81%, ratio 0.82, DLCO 61%, TLC 79%, RV/TLC 135%. 6MWT- 02/22/16-97%, 98%, 98%. Stopped after only 26 m complaining of chest tightness, headache, shortness of breath, dizziness. BP and heart rate were unremarkable. Unattended Home Sleep Test-05/25/2016-AHI 36.9/hour, desaturation to 82%, body weight 212 pounds 05/15/2016-63 year old male former smoker followed for OSA, dyspnea on exertion, complicated by chronic pain, DM 2, history cardiac ablation BIPAP 20/26   ----------------------------------------------------------------------------  09/28/17- 63 year old male former smoker followed for OSA/ Insomnia, dyspnea on exertion, complicated by chronic pain, anxiety, DM 2, history cardiac ablation, gout, CHF, CKD VPAP auto 16/13, PS3 [facility]   Had replaced old machine. ----OSA: DME: [facility]. Pt states he wears VPAP nightly-DL attached. No new supplies. Using VPAP more regularly last few nights.  Says main problem is he sleeps poorly at night.  Somehow, because he has to watch over his grandchildren, his usual bedtime is around 3 AM.  Bedroom is darkened against morning sun.  Ambien only seems to work for him for a couple of hours.  He considers his primary problem insufficient sleep. Download 37% compliance, AHI 5.5/hour  10/01/2018- 63 year old male former smoker followed for OSA/ Insomnia, dyspnea on exertion, complicated by chronic pain, anxiety, DM 2/retinopathy, history cardiac ablation, gout/arthritis, CHF/ CM, CKD, HBP, GERD, psoriasis, gout VPAP auto 16/13, PS3/ [facility] >> today 14/ 11, PS2 -----OSA: DME:  [facility]. Pt wears CPAP nightly and DL attached.  Body weight today 234 pounds Download 73% compliance AHI 36.4/hour with high leak Advanced refitted his mask but he still experiences leak and brings a video on his phone for documentation.  Advanced suggested trying to reduce pressure.  He does sleep better with VPAP when he is not fighting with mask leak. He is not actively trying to lose weight. CXR 09/05/2018- IMPRESSION: No active cardiopulmonary disease. No evidence of pneumonia or pulmonary edema.  ROS-see HPI    "+" = positive Constitutional:    weight loss, night sweats, fevers, chills, fatigue, lassitude. HEENT:    headaches, + difficulty swallowing, tooth/dental problems, sore throat,       sneezing, itching, ear ache, nasal congestion, post nasal drip, snoring CV:     chest pain, orthopnea, PND, + swelling in lower extremities, anasarca,                                                          dizziness, palpitations Resp:  + shortness of breath with exertion or at rest.                productive cough,   non-productive cough, coughing up of blood.              change in color of mucus.  wheezing.   Skin:    rash or lesions. GI:  No-   heartburn, indigestion, + abdominal pain, nausea, vomiting, diarrhea,                 change in bowel habits, loss of appetite GU: dysuria, change in color of urine, no urgency or  frequency.   flank pain. MS:   + joint pain, stiffness, decreased range of motion, back pain. Neuro-     nothing unusual Psych:  change in mood or affect.  depression or anxiety.   memory loss.  OBJ- Physical Exam General- Alert, Oriented, Affect-appropriate, Distress- none acute, + overweight  Skin- rash-none, lesions- none, excoriation- none Lymphadenopathy- none Head- atraumatic            Eyes- Gross vision intact, PERRLA, conjunctivae and secretions clear            Ears- Hearing, canals-normal            Nose- Clear, no-Septal dev, mucus, polyps, erosion,  perforation             Throat- Mallampati IV , mucosa clear-not particularly dry , drainage- none, tonsils- atrophic,                 +many missing teeth Neck- flexible , trachea midline, no stridor , thyroid nl, carotid no bruit Chest - symmetrical excursion , unlabored           Heart/CV- RRR , no murmur , no gallop  , no rub, nl s1 s2                           - JVD- none , edema- none, stasis changes- none, varices- none           Lung- clear to P&A, wheeze- none, cough- none , dullness-none, rub- none           Chest wall-  Abd-  Br/ Gen/ Rectal- Not done, not indicated Extrem- cyanosis- none, clubbing, none, atrophy- none, strength- nl Neuro- grossly intact to observation

## 2018-10-01 NOTE — Assessment & Plan Note (Signed)
Normalization of body weight would help several of his health problems and is encouraged.

## 2018-10-01 NOTE — Assessment & Plan Note (Signed)
He benefits from his VPAP with better sleep and is meeting minimum compliance goals.  Excessive leak is interfering.  Mask adjustment has been attempted. Plan-reduce pressure seeking to reduce leak- change to VPAP 14/11, PS2

## 2018-10-05 ENCOUNTER — Other Ambulatory Visit: Payer: Self-pay

## 2018-10-05 ENCOUNTER — Ambulatory Visit (HOSPITAL_COMMUNITY)
Admission: RE | Admit: 2018-10-05 | Discharge: 2018-10-05 | Disposition: A | Discharge status: Home / Self Care | Payer: Medicaid Other | Source: Ambulatory Visit | Attending: Internal Medicine | Admitting: Internal Medicine

## 2018-10-05 ENCOUNTER — Encounter (HOSPITAL_COMMUNITY): Payer: Self-pay | Admitting: Internal Medicine

## 2018-10-05 ENCOUNTER — Ambulatory Visit (HOSPITAL_BASED_OUTPATIENT_CLINIC_OR_DEPARTMENT_OTHER)
Admission: RE | Admit: 2018-10-05 | Discharge: 2018-10-05 | Disposition: A | Discharge status: Home / Self Care | Payer: Medicaid Other | Source: Ambulatory Visit | Attending: Internal Medicine | Admitting: Internal Medicine

## 2018-10-05 VITALS — BP 144/90 | HR 80 | Wt 233.4 lb

## 2018-10-05 DIAGNOSIS — I43 Cardiomyopathy in diseases classified elsewhere: Secondary | ICD-10-CM | POA: Diagnosis not present

## 2018-10-05 DIAGNOSIS — N183 Chronic kidney disease, stage 3 unspecified: Secondary | ICD-10-CM

## 2018-10-05 DIAGNOSIS — I2510 Atherosclerotic heart disease of native coronary artery without angina pectoris: Secondary | ICD-10-CM | POA: Diagnosis not present

## 2018-10-05 DIAGNOSIS — K219 Gastro-esophageal reflux disease without esophagitis: Secondary | ICD-10-CM | POA: Insufficient documentation

## 2018-10-05 DIAGNOSIS — Z87891 Personal history of nicotine dependence: Secondary | ICD-10-CM | POA: Diagnosis not present

## 2018-10-05 DIAGNOSIS — Z833 Family history of diabetes mellitus: Secondary | ICD-10-CM | POA: Insufficient documentation

## 2018-10-05 DIAGNOSIS — F40240 Claustrophobia: Secondary | ICD-10-CM | POA: Insufficient documentation

## 2018-10-05 DIAGNOSIS — E854 Organ-limited amyloidosis: Secondary | ICD-10-CM | POA: Diagnosis not present

## 2018-10-05 DIAGNOSIS — G8929 Other chronic pain: Secondary | ICD-10-CM | POA: Diagnosis not present

## 2018-10-05 DIAGNOSIS — I130 Hypertensive heart and chronic kidney disease with heart failure and stage 1 through stage 4 chronic kidney disease, or unspecified chronic kidney disease: Secondary | ICD-10-CM | POA: Insufficient documentation

## 2018-10-05 DIAGNOSIS — D472 Monoclonal gammopathy: Secondary | ICD-10-CM | POA: Diagnosis not present

## 2018-10-05 DIAGNOSIS — I5022 Chronic systolic (congestive) heart failure: Secondary | ICD-10-CM

## 2018-10-05 DIAGNOSIS — Z7982 Long term (current) use of aspirin: Secondary | ICD-10-CM | POA: Diagnosis not present

## 2018-10-05 DIAGNOSIS — I428 Other cardiomyopathies: Secondary | ICD-10-CM | POA: Diagnosis not present

## 2018-10-05 DIAGNOSIS — Z8249 Family history of ischemic heart disease and other diseases of the circulatory system: Secondary | ICD-10-CM | POA: Insufficient documentation

## 2018-10-05 DIAGNOSIS — M069 Rheumatoid arthritis, unspecified: Secondary | ICD-10-CM | POA: Insufficient documentation

## 2018-10-05 DIAGNOSIS — E1122 Type 2 diabetes mellitus with diabetic chronic kidney disease: Secondary | ICD-10-CM | POA: Insufficient documentation

## 2018-10-05 DIAGNOSIS — I10 Essential (primary) hypertension: Secondary | ICD-10-CM

## 2018-10-05 DIAGNOSIS — I5042 Chronic combined systolic (congestive) and diastolic (congestive) heart failure: Secondary | ICD-10-CM | POA: Diagnosis not present

## 2018-10-05 DIAGNOSIS — Z79899 Other long term (current) drug therapy: Secondary | ICD-10-CM | POA: Insufficient documentation

## 2018-10-05 DIAGNOSIS — I4891 Unspecified atrial fibrillation: Secondary | ICD-10-CM | POA: Insufficient documentation

## 2018-10-05 DIAGNOSIS — Z794 Long term (current) use of insulin: Secondary | ICD-10-CM | POA: Diagnosis not present

## 2018-10-05 DIAGNOSIS — E785 Hyperlipidemia, unspecified: Secondary | ICD-10-CM | POA: Diagnosis not present

## 2018-10-05 DIAGNOSIS — G4733 Obstructive sleep apnea (adult) (pediatric): Secondary | ICD-10-CM | POA: Diagnosis not present

## 2018-10-05 DIAGNOSIS — D649 Anemia, unspecified: Secondary | ICD-10-CM | POA: Diagnosis not present

## 2018-10-05 MED ORDER — SACUBITRIL-VALSARTAN 97-103 MG PO TABS: 1.0000 | ORAL_TABLET | Freq: Two times a day (BID) | ORAL | 6 refills | Status: DC

## 2018-10-05 NOTE — Progress Notes (Signed)
  Echocardiogram 2D Echocardiogram has been performed.  Marco [name] L Androw 10/05/2018, 9:51 AM

## 2018-10-05 NOTE — Progress Notes (Signed)
[facility] Consult Note   Referring Physician: [name], [name]. PCP: [name], MD PCP-Cardiologist: [name], MD  HF: [name]   HPI:  Marco Frix is a 63 y.o. 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   He was seen by [name] in 03/2018 for SOB and found to have a drop in EF to 35-40% with inferior hypokinesis. Cardiac cath 05/19/18 showed mild nonobstructive CAD, mod-severely elevated LVEDP 28, EF 25-30%.   PYP 11/19  (grade 2, H/CLL equal 1.94) are strongly suggestive of transthyretin amyloidosis. However on my review, did not seem so positiveMyleloma panel with MGUS, discussed with [name] who recommend follow up labwork for common, non-pathologic findings. Did not think he had myeloma  Here for routine f/u. Doing fairly sill SOB with moderate exertion. No edema, orthopnea or PND. No CP. Started tafamdis about 1 week ago. Weight stable. Taking lasix 40 bid.   Echo today about 40%   Studies:   LHC 11/2015 showed minimal CAD, mild calcification noted extraluminally, normal LVF, normal LVEDP. CPX 06/2016 with limited interpretation due to submaximal effort during exercise, difficult to interpret, also of note was very intolerant to discomfort from several processes during the CPX procedure today (i.e. motion on bike, electrode removal, etc).  - Echo 09/03/16 EF 45-50% with global HK, grade 2 DD, elevated LV filling pressure. CTA 09/02/16 was negative for PE.  - Nuclear stress test 12/2016 was done for atypical CP showing small defect of  severe severity in apical septal and apex location, no ischemia, EF 44% - suspected artifact.  - Echo 01/20/17 EF 50-55%, hypokinesis of apical inferoseptal myocardium, grade 1 diastolic dysfunction, mild MR, no sig change from prior.  - Event monitor 12/2016 showed NSR with occasional PVCs, otherwise benign.  - ETT 02/2017 was nondiagnostic due to 20 seconds of exercise and severe SOB. -Cardiac cath 05/19/18 showed mild nonobstructive CAD (see below), mod-severely elevated LVEDP 28, EF 25-30%. - Echo 05/19/18 LVEF 25-30%, Grade 1 DD, Trivial MR.  - PYP 11/19 Visual and quantitative assessment (grade 2, H/CLL equal 1.94) are strongly suggestive of transthyretin amyloidosis.  LHC 05/19/18  - Ost 2nd Diag lesion is 35% stenosed.  Prox LAD to Mid LAD lesion is 20% stenosed.  Ost Cx to Prox Cx lesion is 10% stenosed.  There is severe left ventricular systolic dysfunction.  LV end diastolic pressure is moderately elevated. The left ventricular ejection fraction is 25-35% by visual estimate. - LVEDP 28  CPX 06/19/2016 - Limited due to submaximal effort and poor amount of information.  FVC 2.35 (65%)    FEV1 1.94 (66%)     FEV1/FVC 82 (102%)     MVV 117 (86%) Post-Exercise PFTs--------- Not performed due to sudden termination of exercise Exercise Time:  1:56      Watts: 10 RPE: 17 Resting HR: 70 Peak HR: 84  (50% age predicted max HR) BP rest: 148/82 BP peak: 156/84 Peak VO2: 9.7 (50% predicted peak VO2) VE/VCO2 slope: 30 OUES: 1.34 Peak RER: 0.97 Ventilatory Threshold: CANNOT BE DETERMINED Peak RR 78 Peak Ventilation: 28.8 VE/MVV: 25% PETCO2 at peak: 36  O2pulse: 11  (79% predicted O2pulse)   Review of systems complete and found to be negative unless listed in HPI.    Past Medical History:  Diagnosis Date  . Benign colon polyp 08/01/2013   [facility] in [place]. large base tranverse colon polyp was biopsied. polyp was benign with minimal  surface hyperplastic change.  . Chronic combined systolic and diastolic CHF (congestive heart failure) ([place])   . Chronic pain   . CKD (chronic kidney disease), stage III ([place])   . Diabetes mellitus without complication ([place])   . Diverticulosis   . Esophageal hiatal hernia 07/29/2013   confirmed on EGD   . Esophageal stricture   . Essential hypertension   . Gastritis 07/29/2013   confirmed on EGD, bx done an negative for intestinal metaplasia, dsyplasia or H. pylori. normal gastric emptying study done 07/13/2013.  [name] GERD (gastroesophageal reflux disease)   . Morbid obesity ([place])   . Non-obstructive CAD    a. 02/2013 Cath ([place]): nonobs dzs;  b. 09/2014 Myoview ([place]): EF 55%, no ischemia;  c. Cath 05/2018 mild CAD no obstruction.  . Persistent atrial fibrillation    a. 02/2013 s/p rfca in [place], [place]-->prev on Xarelto, d/c'd 2/2 anemia, ? GIB.  [name] Rheumatoid arthritis ([place])   . Sigmoid diverticulosis 08/01/2013   confirmed on colonscopy. record scanned into chart    Current Outpatient Medications  Medication Sig Dispense Refill  . ACCU-CHEK FASTCLIX LANCETS MISC USE AS DIRECTED TO  CHECK  BLOOD  SUGAR  3  TIMES  DAILY 102 each 11  . albuterol (PROVENTIL HFA;VENTOLIN HFA) 108 (90 Base) MCG/ACT inhaler Inhale 2 puffs into the lungs every 6 (six) hours as needed for wheezing or shortness of breath. 1 Inhaler 0  . allopurinol (ZYLOPRIM) 100 MG tablet Take 100 mg by mouth daily.    [name] aspirin (EQ ASPIRIN ADULT LOW DOSE) 81 MG EC tablet Take 1 tablet (81 mg total) by mouth daily. Swallow whole. 90 tablet 3  . atorvastatin (LIPITOR) 40 MG tablet Take 1 tablet (40 mg total) by mouth daily. 90 tablet 3  . Blood Pressure KIT 1 each by Does not apply route 2 (two) times daily. 1 each 0  . carvedilol (COREG) 12.5 MG tablet Take 1 tablet (12.5 mg total) by mouth 2 (two) times daily. 180 tablet 3  . DULoxetine (CYMBALTA) 20 MG capsule TAKE 1 CAPSULE BY MOUTH ONCE DAILY 30 capsule 2  . famotidine (PEPCID)  20 MG tablet Take 1 tablet (20 mg total) by mouth 2 (two) times daily. 60 tablet 6  . furosemide (LASIX) 40 MG tablet Take 1 tablet (40 mg total) by mouth 2 (two) times daily. 180 tablet 3  . glucose blood (ACCU-CHEK AVIVA PLUS) test strip 1 each by Other route 2 (two) times daily. And lancets 2/day 200 each 3  . hydrALAZINE (APRESOLINE) 10 MG tablet Take 1 tablet (10 mg total) by mouth every 8 (eight) hours. 90 tablet 1  . HYDROcodone-acetaminophen (NORCO) 7.5-325 MG tablet Take 1 tablet by mouth 3 (three) times daily.  0  . insulin NPH Human (HUMULIN N) 100 UNIT/ML injection Inject 1.4 mLs (140 Units total) into the skin every morning. And syringes 2/day 50 mL 11  . isosorbide mononitrate (IMDUR) 30 MG 24 hr tablet Take 1 tablet (30 mg total) by mouth daily. 90 tablet 3  . Potassium Chloride ER 20 MEQ TBCR Take 20 mEq by mouth daily. 90 tablet 3  . predniSONE (DELTASONE) 10  MG tablet Take 10 mg by mouth daily with breakfast.    . pregabalin (LYRICA) 100 MG capsule Take 1 capsule (100 mg total) by mouth 3 (three) times daily. 90 capsule 3  . sacubitril-valsartan (ENTRESTO) 49-51 MG Take 1 tablet by mouth 2 (two) times daily. 60 tablet 3  . Secukinumab (COSENTYX) 150 MG/ML SOSY Inject 150 mg into the skin every 28 (twenty-eight) days.     . Tafamidis (VYNDAMAX) 61 MG CAPS Take 61 mg by mouth daily. 30 capsule 11  . zolpidem (AMBIEN CR) 6.25 MG CR tablet Take 1 tablet (6.25 mg total) by mouth at bedtime as needed for sleep. Each prescription to last 1 month. 30 tablet 3   No current facility-administered medications for this encounter.     Allergies  Allergen Reactions  . Acetaminophen       Social History   Socioeconomic History  . Marital status: Married    Spouse name: Amethyst  . Number of children: 8  . Years of education: 65  . Highest education level: Some college, no degree  Occupational History    Comment: disabled  Social Needs  . Financial resource strain: Hard  . Food  insecurity:    Worry: Often true    Inability: Often true  . Transportation needs:    Medical: Yes    Non-medical: Yes  Tobacco Use  . Smoking status: Former Smoker    Packs/day: 0.50    Years: 10.00    Pack years: 5.00    Types: Cigarettes    Last attempt to quit: 04/11/2008    Years since quitting: 10.4  . Smokeless tobacco: Never Used  Substance and Sexual Activity  . Alcohol use: No    Alcohol/week: 0.0 standard drinks  . Drug use: No  . Sexual activity: Yes    Partners: Female    Birth control/protection: None  Lifestyle  . Physical activity:    Days per week: Not on file    Minutes per session: Not on file  . Stress: Not on file  Relationships  . Social connections:    Talks on phone: Not on file    Gets together: Not on file    Attends religious service: Not on file    Active member of club or organization: Not on file    Attends meetings of clubs or organizations: Not on file    Relationship status: Not on file  . Intimate partner violence:    Fear of current or ex partner: Not on file    Emotionally abused: Not on file    Physically abused: Not on file    Forced sexual activity: Not on file  Other Topics Concern  . Not on file  Social History Narrative   Lives with wife. Does not work.  On disability.    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 Provided crisis assistance program information to assist with bills       Has issues getting food- gets $36/month in food stamps- already has list of food pantries but has not tried them- encouraged pt to try food pantries and reach out to clinic for help if needed      Family History  Problem Relation Age of Onset  . Hypertension Mother   . Diabetes Mother   .  COPD Mother   . Lung cancer Mother        Smoker   . Hypertension Father    . Diabetes Father   . Heart Problems Father   . COPD Father   . Colon cancer Neg Hx   . Stomach cancer Neg Hx     Vitals:   10/05/18 0949  BP: (!) 144/90  Pulse: 80  SpO2: 94%  Weight: 105.9 kg (233 lb 6 oz)    Wt Readings from Last 3 Encounters:  10/05/18 105.9 kg (233 lb 6 oz)  10/01/18 106.1 kg (234 lb)  09/13/18 102.1 kg (225 lb)     PHYSICAL EXAM: General:  Well appearing. No resp difficulty HEENT: normal Neck: supple. no JVD. Carotids 2+ bilat; no bruits. No lymphadenopathy or thryomegaly appreciated. Cor: PMI nondisplaced. Regular rate & rhythm. No rubs, gallops or murmurs. Lungs: clear Abdomen: obese soft, nontender, nondistended. No hepatosplenomegaly. No bruits or masses. Good bowel sounds. Extremities: no cyanosis, clubbing, rash, edema Neuro: alert & orientedx3, cranial nerves grossly intact. moves all 4 extremities w/o difficulty. Affect pleasant   ASSESSMENT & PLAN:  1. Chronic combined systolic and diastolic CHF  - Echo 31/4/38 LVEF 25-30%, Grade 1 DD, Trivial MR.  - Echo today EF ~40% . No need for ICD currently.  - Stable NYHA II-early III symptoms.  - Volume status stable on exam  - Very mild non-obstructive CAD on cath 05/19/18,  NICM  - PYP scan + for Cardiac amyloidosis. But when I reviewed I am not 887% certain it is positive. Genetic testing negative. Myeloma Panel with MGUS, no myeloma. Will arrange f/u with Dr. Alen Blew. Now on Tafamadis. Repeat PYP in 6 months - Etiology unclear. Ideally, would get cMRI, but limited from kidney function and claustrophobia.  - Continue lasix 40 mg bid - Increase Entresto to 97/103 bid - Continue coreg to 12.5 mg BID as less likely to have a strong effect on BP. - Continue hydralazine 10 mg TID. Wil ltitrate at next visits - Continue imdur 30 mg daily.  - Consider SGLT2i - Reinforced fluid restriction to < 2 L daily, sodium restriction to less than 2000 mg daily, and the importance of daily weights.   2. Cardiac  Amyloidosis - By PYP scan 06/29/18. Grade 2, H/CLL equal 1.94. See discussion above - Genetic testing negative 3. Nonobstructive CAD  - By cath 05/2018 as above - Continue ASA . Dr [name] managing lipids 4. CKD stage III - Creatinien stable 1.5 recently  5. History of atrial fibrillation  - s/p RFCA 2017. - Previously on Xarelto, stopped due to anemia and unknown blood loss. Should consider re-challenge.  - CHA2DS2/VASc is at least 4. 6. DM2 - Per PCP - Consider Lottie Rater, MD 10/05/18

## 2018-10-05 NOTE — Patient Instructions (Signed)
INCREASE Entresto to 97/103mg  (1 tab) twice a day  You have been referred to Hematology. They will call you to schedule an appointment with them  Your physician recommends that you schedule a follow-up appointment in: 4 months with [name]

## 2018-10-08 ENCOUNTER — Telehealth: Payer: Self-pay | Admitting: Internal Medicine

## 2018-10-08 ENCOUNTER — Ambulatory Visit: Payer: Medicaid Other | Attending: Internal Medicine | Admitting: Internal Medicine

## 2018-10-08 ENCOUNTER — Encounter: Payer: Self-pay | Admitting: Internal Medicine

## 2018-10-08 ENCOUNTER — Telehealth (HOSPITAL_COMMUNITY): Payer: Self-pay

## 2018-10-08 VITALS — BP 114/75 | HR 67 | Temp 98.2°F | Resp 16 | Ht 66.0 in | Wt 232.0 lb

## 2018-10-08 DIAGNOSIS — Z833 Family history of diabetes mellitus: Secondary | ICD-10-CM | POA: Insufficient documentation

## 2018-10-08 DIAGNOSIS — Z79899 Other long term (current) drug therapy: Secondary | ICD-10-CM | POA: Insufficient documentation

## 2018-10-08 DIAGNOSIS — E1140 Type 2 diabetes mellitus with diabetic neuropathy, unspecified: Secondary | ICD-10-CM | POA: Diagnosis not present

## 2018-10-08 DIAGNOSIS — Z801 Family history of malignant neoplasm of trachea, bronchus and lung: Secondary | ICD-10-CM | POA: Insufficient documentation

## 2018-10-08 DIAGNOSIS — L729 Follicular cyst of the skin and subcutaneous tissue, unspecified: Secondary | ICD-10-CM | POA: Diagnosis not present

## 2018-10-08 DIAGNOSIS — Z6837 Body mass index (BMI) 37.0-37.9, adult: Secondary | ICD-10-CM | POA: Diagnosis not present

## 2018-10-08 DIAGNOSIS — I4891 Unspecified atrial fibrillation: Secondary | ICD-10-CM | POA: Diagnosis not present

## 2018-10-08 DIAGNOSIS — G44329 Chronic post-traumatic headache, not intractable: Secondary | ICD-10-CM

## 2018-10-08 DIAGNOSIS — Z7982 Long term (current) use of aspirin: Secondary | ICD-10-CM | POA: Insufficient documentation

## 2018-10-08 DIAGNOSIS — Z7952 Long term (current) use of systemic steroids: Secondary | ICD-10-CM | POA: Diagnosis not present

## 2018-10-08 DIAGNOSIS — E876 Hypokalemia: Secondary | ICD-10-CM | POA: Insufficient documentation

## 2018-10-08 DIAGNOSIS — I43 Cardiomyopathy in diseases classified elsewhere: Secondary | ICD-10-CM

## 2018-10-08 DIAGNOSIS — M545 Low back pain, unspecified: Secondary | ICD-10-CM

## 2018-10-08 DIAGNOSIS — E1142 Type 2 diabetes mellitus with diabetic polyneuropathy: Secondary | ICD-10-CM | POA: Insufficient documentation

## 2018-10-08 DIAGNOSIS — E1165 Type 2 diabetes mellitus with hyperglycemia: Secondary | ICD-10-CM | POA: Diagnosis not present

## 2018-10-08 DIAGNOSIS — E1122 Type 2 diabetes mellitus with diabetic chronic kidney disease: Secondary | ICD-10-CM | POA: Insufficient documentation

## 2018-10-08 DIAGNOSIS — G4733 Obstructive sleep apnea (adult) (pediatric): Secondary | ICD-10-CM

## 2018-10-08 DIAGNOSIS — E785 Hyperlipidemia, unspecified: Secondary | ICD-10-CM | POA: Insufficient documentation

## 2018-10-08 DIAGNOSIS — E11319 Type 2 diabetes mellitus with unspecified diabetic retinopathy without macular edema: Secondary | ICD-10-CM | POA: Insufficient documentation

## 2018-10-08 DIAGNOSIS — Z125 Encounter for screening for malignant neoplasm of prostate: Secondary | ICD-10-CM

## 2018-10-08 DIAGNOSIS — Z794 Long term (current) use of insulin: Secondary | ICD-10-CM

## 2018-10-08 DIAGNOSIS — Z886 Allergy status to analgesic agent status: Secondary | ICD-10-CM | POA: Insufficient documentation

## 2018-10-08 DIAGNOSIS — N189 Chronic kidney disease, unspecified: Secondary | ICD-10-CM | POA: Diagnosis not present

## 2018-10-08 DIAGNOSIS — G8929 Other chronic pain: Secondary | ICD-10-CM | POA: Diagnosis not present

## 2018-10-08 DIAGNOSIS — R300 Dysuria: Secondary | ICD-10-CM

## 2018-10-08 DIAGNOSIS — I5042 Chronic combined systolic (congestive) and diastolic (congestive) heart failure: Secondary | ICD-10-CM

## 2018-10-08 DIAGNOSIS — Z8249 Family history of ischemic heart disease and other diseases of the circulatory system: Secondary | ICD-10-CM | POA: Insufficient documentation

## 2018-10-08 DIAGNOSIS — I130 Hypertensive heart and chronic kidney disease with heart failure and stage 1 through stage 4 chronic kidney disease, or unspecified chronic kidney disease: Secondary | ICD-10-CM | POA: Diagnosis not present

## 2018-10-08 DIAGNOSIS — I428 Other cardiomyopathies: Secondary | ICD-10-CM | POA: Insufficient documentation

## 2018-10-08 DIAGNOSIS — E118 Type 2 diabetes mellitus with unspecified complications: Secondary | ICD-10-CM

## 2018-10-08 DIAGNOSIS — Z87891 Personal history of nicotine dependence: Secondary | ICD-10-CM | POA: Insufficient documentation

## 2018-10-08 DIAGNOSIS — I2510 Atherosclerotic heart disease of native coronary artery without angina pectoris: Secondary | ICD-10-CM | POA: Insufficient documentation

## 2018-10-08 DIAGNOSIS — E854 Organ-limited amyloidosis: Secondary | ICD-10-CM

## 2018-10-08 DIAGNOSIS — M62838 Other muscle spasm: Secondary | ICD-10-CM | POA: Diagnosis not present

## 2018-10-08 LAB — POCT URINALYSIS DIP (CLINITEK)
Bilirubin, UA: NEGATIVE
Glucose, UA: NEGATIVE mg/dL
Ketones, POC UA: NEGATIVE mg/dL
Nitrite, UA: NEGATIVE
POC PROTEIN,UA: NEGATIVE
RBC UA: NEGATIVE
Spec Grav, UA: <=1.005 — AB (ref 1.010–1.025)
Urobilinogen, UA: 0.2 E.U./dL
pH, UA: 6 (ref 5.0–8.0)

## 2018-10-08 LAB — GLUCOSE, POCT (MANUAL RESULT ENTRY): POC GLUCOSE: 115 mg/dL — AB (ref 70–99)

## 2018-10-08 NOTE — Telephone Encounter (Signed)
-----   Message from Larey Dresser, MD sent at 10/06/2018  4:51 PM EST ----- Mild to moderate RV dysfunction, EF 40-45%.

## 2018-10-08 NOTE — Patient Instructions (Addendum)
Please let me know if the cyst on the left buttock increases in size and become bothersome again so that we can refer you to a surgeon to have this removed.  You have a scar on your forehead from your fall a few months ago.  The firmness that you are feeling is scarring of the underlying tissue.  You can take an extra Tylenol as needed whenever you feel a headache in that area.  Your urine today did not reveal a bladder infection.  Please go to the radiology department at the hospital to get an x-ray done of your lower back.  We will plan to refer you for some physical therapy.  If no improvement with this then the next step would be for more detail imaging and referral to spine specialist.

## 2018-10-08 NOTE — Telephone Encounter (Signed)
Left VM to call office for Echo results

## 2018-10-08 NOTE — Progress Notes (Signed)
Patient ID: Marco [name], male    DOB: [birth year]  MRN: [id number]  CC: Diabetes and Hypertension   Subjective: [name] is a 63 y.o. male who presents for chronic ds management His concerns today include:  63 year old male with history of HTN,DM2 with neuropathy, retinopathyand nephropathy, CKD stage III, obesity, nonobstructive CAD, chronic combined CHF, OSA on VPAP, followed by rheumatology in [facility] for psoriasis/gout/nonspec arthritis. On narcotics through [facility] for chronic pain inchest/back/shoulders/neck,Insomnia  "My lower back is really bothering me when I try to stand up for any length of time" x 2 wks.  Always had issues with his back for which he sees a pain specialist but reports that it has been worse over the past 2 weeks.  The pain is across the entire lower back.  Pain does not radiate down the legs.  No numbness or tingling.  No initiating factors.  He endorses some dysuria over the past few days.  No hematuria.  Last x-ray of the lumbar spine was done in April 2018 which revealed mild multilevel DDD.  No recent PSA levels.  Complains of having a knot over RT forehead where he is sustained a large abrasion over 2 months ago when [place] the bus [place].  This area hurts when he squints his right eye.  He also reports intermittent headaches over this area.  Headaches occur few times a day last 5-10 mins.     +tearing BL, no blurred vision.   No photophobia.  CT of the head today after his fall was negative for any acute findings  Has a bump on LT buttock x 3 wks.  Was large initially but now smaller.  No pain or drainage.  It seems to come and go.  nonobst CAD/cardiac amlyoid/combine CHF:  Saw cardiology this wk.  Echo showed improved EF from 25% to 40-45%.  Diagnosed with cardiac amyloidosis was started on Tafamidis.  No chest pains.  He has chronic shortness of breath with exertion.  DM: DM: Checking blood sugars twice a day.  Reports range  between 90-1 20.  He has not had any low blood sugar episodes.  He has follow-up appointment with [name] next month. Reports he is doing better with his eating habits.  He has cut out eating fast foods altogether.  Plans to start a weight loss program through his pain specialist clinic that will include him meeting with a nutritionist for counseling.  He also plans to do join the [facility]  Obstructive sleep apnea: He has seen Dr. Annamaria Boots.  He is now on VPAP.  States that he is tolerating the new settings better  Patient Active Problem List   Diagnosis Date Noted  . Gastroesophageal reflux disease without esophagitis 08/31/2018  . Muscle spasm 08/31/2018  . NICM (nonischemic cardiomyopathy) ([place]) 07/06/2018  . Hypokalemia   . Hyperlipidemia 12/23/2016  . Chronic combined systolic and diastolic CHF (congestive heart failure) ([place]) 09/09/2016  . Diabetic retinopathy ([place]) 07/22/2016  . Diabetic polyneuropathy associated with type 2 diabetes mellitus ([place]) 06/10/2016  . Vision changes 06/02/2016  . Myalgia and myositis 03/31/2016  . Shortness of breath 02/09/2016  . Chronic gouty arthritis 11/29/2015  . LBP (low back pain) 11/27/2015  . Arthralgia of multiple joints 11/27/2015  . Psoriasis 11/27/2015  . Breast pain in male 11/14/2015  . Sinusitis, chronic 10/16/2015  . Insomnia 10/16/2015  . New onset of headaches after age 36 09/20/2015  . OSA (obstructive sleep apnea) 07/12/2015  .  Low serum testosterone level 05/18/2015  . Depression 05/16/2015  . Morbid obesity due to excess calories ([place])   . Chronic pain   . CAD (coronary artery disease) 12/24/2014  . Atrial fibrillation ([place]) 12/24/2014  . Essential hypertension 12/24/2014  . Chronic kidney disease 12/24/2014  . PUD (peptic ulcer disease) 12/24/2014     Current Outpatient Medications on File Prior to Visit  Medication Sig Dispense Refill  . ACCU-CHEK FASTCLIX LANCETS MISC USE AS DIRECTED TO  CHECK  BLOOD  SUGAR  3  TIMES   DAILY 102 each 11  . albuterol (PROVENTIL HFA;VENTOLIN HFA) 108 (90 Base) MCG/ACT inhaler Inhale 2 puffs into the lungs every 6 (six) hours as needed for wheezing or shortness of breath. 1 Inhaler 0  . allopurinol (ZYLOPRIM) 100 MG tablet Take 100 mg by mouth daily.    [name] aspirin (EQ ASPIRIN ADULT LOW DOSE) 81 MG EC tablet Take 1 tablet (81 mg total) by mouth daily. Swallow whole. 90 tablet 3  . atorvastatin (LIPITOR) 40 MG tablet Take 1 tablet (40 mg total) by mouth daily. 90 tablet 3  . Blood Pressure KIT 1 each by Does not apply route 2 (two) times daily. 1 each 0  . carvedilol (COREG) 12.5 MG tablet Take 1 tablet (12.5 mg total) by mouth 2 (two) times daily. 180 tablet 3  . DULoxetine (CYMBALTA) 20 MG capsule TAKE 1 CAPSULE BY MOUTH ONCE DAILY 30 capsule 2  . famotidine (PEPCID) 20 MG tablet Take 1 tablet (20 mg total) by mouth 2 (two) times daily. 60 tablet 6  . furosemide (LASIX) 40 MG tablet Take 1 tablet (40 mg total) by mouth 2 (two) times daily. 180 tablet 3  . glucose blood (ACCU-CHEK AVIVA PLUS) test strip 1 each by Other route 2 (two) times daily. And lancets 2/day 200 each 3  . hydrALAZINE (APRESOLINE) 10 MG tablet Take 1 tablet (10 mg total) by mouth every 8 (eight) hours. 90 tablet 1  . HYDROcodone-acetaminophen (NORCO) 7.5-325 MG tablet Take 1 tablet by mouth 3 (three) times daily.  0  . insulin NPH Human (HUMULIN N) 100 UNIT/ML injection Inject 1.4 mLs (140 Units total) into the skin every morning. And syringes 2/day 50 mL 11  . isosorbide mononitrate (IMDUR) 30 MG 24 hr tablet Take 1 tablet (30 mg total) by mouth daily. 90 tablet 3  . Potassium Chloride ER 20 MEQ TBCR Take 20 mEq by mouth daily. 90 tablet 3  . predniSONE (DELTASONE) 10 MG tablet Take 10 mg by mouth daily with breakfast.    . pregabalin (LYRICA) 100 MG capsule Take 1 capsule (100 mg total) by mouth 3 (three) times daily. 90 capsule 3  . sacubitril-valsartan (ENTRESTO) 97-103 MG Take 1 tablet by mouth 2 (two) times  daily. 60 tablet 6  . Secukinumab (COSENTYX) 150 MG/ML SOSY Inject 150 mg into the skin every 28 (twenty-eight) days.     . Tafamidis (VYNDAMAX) 61 MG CAPS Take 61 mg by mouth daily. 30 capsule 11  . zolpidem (AMBIEN CR) 6.25 MG CR tablet Take 1 tablet (6.25 mg total) by mouth at bedtime as needed for sleep. Each prescription to last 1 month. 30 tablet 3   No current facility-administered medications on file prior to visit.     Allergies  Allergen Reactions  . Acetaminophen     Social History   Socioeconomic History  . Marital status: Married    Spouse name: Amethyst  . Number of children: 8  . Years  of education: 80  . Highest education level: Some college, no degree  Occupational History    Comment: disabled  Social Needs  . Financial resource strain: Hard  . Food insecurity:    Worry: Often true    Inability: Often true  . Transportation needs:    Medical: Yes    Non-medical: Yes  Tobacco Use  . Smoking status: Former Smoker    Packs/day: 0.50    Years: 10.00    Pack years: 5.00    Types: Cigarettes    Last attempt to quit: 04/11/2008    Years since quitting: 10.4  . Smokeless tobacco: Never Used  Substance and Sexual Activity  . Alcohol use: No    Alcohol/week: 0.0 standard drinks  . Drug use: No  . Sexual activity: Yes    Partners: Female    Birth control/protection: None  Lifestyle  . Physical activity:    Days per week: Not on file    Minutes per session: Not on file  . Stress: Not on file  Relationships  . Social connections:    Talks on phone: Not on file    Gets together: Not on file    Attends religious service: Not on file    Active member of club or organization: Not on file    Attends meetings of clubs or organizations: Not on file    Relationship status: Not on file  . Intimate partner violence:    Fear of current or ex partner: Not on file    Emotionally abused: Not on file    Physically abused: Not on file    Forced sexual activity: Not  on file  Other Topics Concern  . Not on file  Social History Narrative   Lives with wife. Does not work.  On disability.    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 Provided crisis assistance program information to assist with bills       Has issues getting food- gets $36/month in food stamps- already has list of food pantries but has not tried them- encouraged pt to try food pantries and reach out to clinic for help if needed    Family History  Problem Relation Age of Onset  . Hypertension Mother   . Diabetes Mother   . COPD Mother   . Lung cancer Mother        Smoker   . Hypertension Father   . Diabetes Father   . Heart Problems Father   . COPD Father   . Colon cancer Neg Hx   . Stomach cancer Neg Hx     Past Surgical History:  Procedure Laterality Date  . CARDIAC CATHETERIZATION  05/2014   ablation for atrial fibrillation  . CARDIAC CATHETERIZATION N/A 11/16/2015   Procedure: Left Heart Cath and Coronary Angiography;  Surgeon: Leonie Man, MD;  Location: [facility];  Service: Cardiovascular;  Laterality: N/A;  . COLON RESECTION  09/2013   due to large, abnormal polpy. non cancerous per patient.   . COLON SURGERY  09/2014   colon resection   . LEFT HEART CATH AND CORONARY ANGIOGRAPHY N/A 05/19/2018   Procedure: LEFT HEART CATH AND CORONARY ANGIOGRAPHY;  Surgeon: Wellington Hampshire, MD;  Location: [facility];  Service: Cardiovascular;  Laterality:  N/A;    ROS: Review of Systems Negative except as stated above  PHYSICAL EXAM: BP 114/75   Pulse 67   Temp 98.2 F (36.8 C) (Oral)   Resp 16   Ht '5\' 6"'$  (1.676 m)   Wt 232 lb (105.2 kg)   SpO2 94%   BMI 37.45 kg/m   Physical Exam  General appearance - alert, well appearing, and in no distress Mental status - normal mood,  behavior, speech, dress, motor activity, and thought processes Neck - supple, no significant adenopathy Chest - clear to auscultation, no wheezes, rales or rhonchi, symmetric air entry Heart - normal rate, regular rhythm, normal S1, S2, no murmurs, rubs, clicks or gallops Neurological -power in the lower extremities 5/5 bilaterally.  Gross sensation intact in the lower extremities. Musculoskeletal -right leg raise to about 60 degrees produces pain in the lower back on both sides.  Gait is stable.  Extremities -no lower extremity edema.  Good peripheral pulses. Skin -pea-sized subcutaneous cyst felt close to the gluteal fold on the left side.  He has a small  Results for orders placed or performed in visit on 10/08/18  POCT glucose (manual entry)  Result Value Ref Range   POC Glucose 115 (A) 70 - 99 mg/dl  POCT URINALYSIS DIP (CLINITEK)  Result Value Ref Range   Color, UA yellow yellow   Clarity, UA clear clear   Glucose, UA negative negative mg/dL   Bilirubin, UA negative negative   Ketones, POC UA negative negative mg/dL   Spec Grav, UA <=1.005 (A) 1.010 - 1.025   Blood, UA negative negative   pH, UA 6.0 5.0 - 8.0   POC PROTEIN,UA negative negative, trace   Urobilinogen, UA 0.2 0.2 or 1.0 E.U./dL   Nitrite, UA Negative Negative   Leukocytes, UA Trace (A) Negative   Lab Results  Component Value Date   HGBA1C 8.2 (A) 08/17/2018    CMP Latest Ref Rng & Units 09/13/2018 08/31/2018 07/22/2018  Glucose 65 - 99 mg/dL - 180(H) 170(H)  BUN 8 - 27 mg/dL - 19 9  Creatinine 0.76 - 1.27 mg/dL - 1.59(H) 1.33(H)  Sodium 134 - 144 mmol/L - 141 142  Potassium 3.5 - 5.2 mmol/L - 3.7 3.7  Chloride 96 - 106 mmol/L - 101 105  CO2 20 - 29 mmol/L - 23 27  Calcium 8.6 - 10.2 mg/dL - 8.9 8.6(L)  Total Protein 6.0 - 8.5 g/dL 7.0 - -  Total Bilirubin 0.0 - 1.2 mg/dL 0.3 - -  Alkaline Phos 39 - 117 IU/L 69 - -  AST 0 - 40 IU/L 11 - -  ALT 0 - 44 IU/L 21 - -   Lipid Panel     Component Value  Date/Time   CHOL 218 (H) 09/13/2018 1044   TRIG 214 (H) 09/13/2018 1044   HDL 32 (L) 09/13/2018 1044   CHOLHDL 6.8 (H) 09/13/2018 1044   CHOLHDL 2.6 06/10/2016 1253   VLDL 18 06/10/2016 1253   LDLCALC 143 (H) 09/13/2018 1044    CBC    Component Value Date/Time   WBC 7.9 05/20/2018 0420   RBC 4.68 05/20/2018 0420   HGB 13.8 05/20/2018 0420   HGB 14.4 04/29/2018 1717   HCT 43.9 05/20/2018 0420   HCT 42.7 04/29/2018 1717   PLT 228 05/20/2018 0420   PLT 294 04/29/2018 1717   MCV 93.8 05/20/2018 0420   MCV 89 04/29/2018 1717   MCH 29.5 05/20/2018 0420   MCHC 31.4 05/20/2018 0420  RDW 15.2 05/20/2018 0420   RDW 13.2 04/29/2018 1717   LYMPHSABS 1.4 04/29/2018 1717   MONOABS 1.1 (H) 08/24/2017 1819   EOSABS 0.0 04/29/2018 1717   BASOSABS 0.1 04/29/2018 1717   Results for orders placed or performed in visit on 10/08/18  POCT glucose (manual entry)  Result Value Ref Range   POC Glucose 115 (A) 70 - 99 mg/dl  POCT URINALYSIS DIP (CLINITEK)  Result Value Ref Range   Color, UA yellow yellow   Clarity, UA clear clear   Glucose, UA negative negative mg/dL   Bilirubin, UA negative negative   Ketones, POC UA negative negative mg/dL   Spec Grav, UA <=1.005 (A) 1.010 - 1.025   Blood, UA negative negative   pH, UA 6.0 5.0 - 8.0   POC PROTEIN,UA negative negative, trace   Urobilinogen, UA 0.2 0.2 or 1.0 E.U./dL   Nitrite, UA Negative Negative   Leukocytes, UA Trace (A) Negative   ASSESSMENT AND PLAN:  1. Chronic bilateral low back pain without sciatica Patient with acute on chronic lower back pain. UA is negative for UTI We will check a PSA, get updated x-rays of the lumbar spine and refer to physical therapy - DG Lumbar Spine Complete; Future - Ambulatory referral to Physical Therapy - PSA; Future  2. Chronic post-traumatic headache, not intractable Patient does not have a lump on the right side of his forehead.  What he is feeling is scarring over the area where he had a  large abrasion that is now healed.  Neurologic exam is nonfocal.  CT scan of the head done 2 months prior did not reveal any acute findings.  I usually use Indocin for this type of headache but patient has chronic kidney disease.  I told him that he can take an extra 325 mg of Tylenol as needed but not to exceed more than 2 Tylenols a day given that he is on Vicodin  3. Subcutaneous cyst Patient will let me know if this increases in size we can refer him to a surgeon to have it removed  4. Controlled type 2 diabetes mellitus with complication, with long-term current use of insulin ([place]) Controlled based on blood sugar readings reported.  Encouraged him to continue making positive changes in his eating habits.  He plans to join the gym - POCT glucose (manual entry)  5. OSA (obstructive sleep apnea) Followed by Dr. Annamaria Boots.  Compliant with VPAP  6. Cardiac amyloidosis ([place]) 7. Chronic combined systolic and diastolic CHF, NYHA class 2 ([place]) Followed by cardiology.  8. Dysuria - POCT URINALYSIS DIP (CLINITEK)  9. Prostate cancer screening - PSA; Future   Patient was given the opportunity to ask questions.  Patient verbalized understanding of the plan and was able to repeat key elements of the plan.   Orders Placed This Encounter  Procedures  . DG Lumbar Spine Complete  . PSA  . Ambulatory referral to Physical Therapy  . POCT glucose (manual entry)  . POCT URINALYSIS DIP (CLINITEK)     Requested Prescriptions    No prescriptions requested or ordered in this encounter    Return in about 2 months (around 12/07/2018).  Karle Plumber, MD, FACP

## 2018-10-11 ENCOUNTER — Telehealth (HOSPITAL_COMMUNITY): Payer: Self-pay

## 2018-10-11 NOTE — Telephone Encounter (Signed)
Spoke with pt, verbalized understanding of Echo results

## 2018-10-11 NOTE — Telephone Encounter (Signed)
Contacted pt to go over Dr. Wynetta Emery message pt states he understands and he will come by the same day he does his xray

## 2018-10-19 ENCOUNTER — Ambulatory Visit (INDEPENDENT_AMBULATORY_CARE_PROVIDER_SITE_OTHER): Payer: Medicaid Other | Admitting: Endocrinology

## 2018-10-19 ENCOUNTER — Encounter: Payer: Self-pay | Admitting: Endocrinology

## 2018-10-19 VITALS — BP 120/60 | HR 78 | Ht 66.0 in | Wt 240.4 lb

## 2018-10-19 DIAGNOSIS — Z794 Long term (current) use of insulin: Secondary | ICD-10-CM | POA: Diagnosis not present

## 2018-10-19 DIAGNOSIS — E118 Type 2 diabetes mellitus with unspecified complications: Secondary | ICD-10-CM

## 2018-10-19 LAB — POCT GLYCOSYLATED HEMOGLOBIN (HGB A1C): Hemoglobin A1C: 8.1 % — AB (ref 4.0–5.6)

## 2018-10-19 MED ORDER — INSULIN NPH (HUMAN) (ISOPHANE) 100 UNIT/ML ~~LOC~~ SUSP: 150.0000 [IU] | SUBCUTANEOUS | 11 refills | Status: DC

## 2018-10-19 NOTE — Progress Notes (Signed)
Subjective:    Patient ID: Marco [name], male    DOB: [birth year], 63 y.o.   MRN: [id number]  HPI Pt returns for f/u of diabetes mellitus: DM type: Insulin-requiring type 2 Dx'ed: 7681 Complications: polyneuropathy, CAD, NPDR, and renal failure.  Therapy: insulin since soon after dx DKA: never Severe hypoglycemia: never.  Pancreatitis: never.  Pancreatic imaging: never.  Other: he chronically takes prednisone, for psoriasis, with no plan for taper; he declined to continue multiple daily injections.  Interval history: no cbg record, but states cbg's vary from 100-180 fasting.  He does not check later in the day, but he says he never misses the insulin.  pt states he feels well in general.  He takes the same prednisone.   Past Medical History:  Diagnosis Date  . Benign colon polyp 08/01/2013   [facility] in [place]. large base tranverse colon polyp was biopsied. polyp was benign with minimal surface hyperplastic change.  . Chronic combined systolic and diastolic CHF (congestive heart failure) ([place])   . Chronic pain   . CKD (chronic kidney disease), stage III ([place])   . Diabetes mellitus without complication ([place])   . Diverticulosis   . Esophageal hiatal hernia 07/29/2013   confirmed on EGD   . Esophageal stricture   . Essential hypertension   . Gastritis 07/29/2013   confirmed on EGD, bx done an negative for intestinal metaplasia, dsyplasia or H. pylori. normal gastric emptying study done 07/13/2013.  [name] GERD (gastroesophageal reflux disease)   . Morbid obesity ([place])   . Non-obstructive CAD    a. 02/2013 Cath ([place]): nonobs dzs;  b. 09/2014 Myoview ([place]): EF 55%, no ischemia;  c. Cath 05/2018 mild CAD no obstruction.  . Persistent atrial fibrillation    a. 02/2013 s/p rfca in [place], [place]-->prev on Xarelto, d/c'd 2/2 anemia, ? GIB.  [name] Rheumatoid arthritis ([place])   . Sigmoid diverticulosis 08/01/2013   confirmed on colonscopy. record scanned into chart    Past Surgical  History:  Procedure Laterality Date  . CARDIAC CATHETERIZATION  05/2014   ablation for atrial fibrillation  . CARDIAC CATHETERIZATION N/A 11/16/2015   Procedure: Left Heart Cath and Coronary Angiography;  Surgeon: Leonie Man, MD;  Location: [facility];  Service: Cardiovascular;  Laterality: N/A;  . COLON RESECTION  09/2013   due to large, abnormal polpy. non cancerous per patient.   . COLON SURGERY  09/2014   colon resection   . LEFT HEART CATH AND CORONARY ANGIOGRAPHY N/A 05/19/2018   Procedure: LEFT HEART CATH AND CORONARY ANGIOGRAPHY;  Surgeon: Wellington Hampshire, MD;  Location: [facility];  Service: Cardiovascular;  Laterality: N/A;    Social History   Socioeconomic History  . Marital status: Married    Spouse name: Amethyst  . Number of children: 8  . Years of education: 34  . Highest education level: Some college, no degree  Occupational History    Comment: disabled  Social Needs  . Financial resource strain: Hard  . Food insecurity:    Worry: Often true    Inability: Often true  . Transportation needs:    Medical: Yes    Non-medical: Yes  Tobacco Use  . Smoking status: Former Smoker    Packs/day: 0.50    Years: 10.00    Pack years: 5.00    Types: Cigarettes    Last attempt to quit: 04/11/2008    Years since quitting: 10.5  . Smokeless tobacco: Never Used  Substance and Sexual Activity  . Alcohol use: No    Alcohol/week: 0.0 standard drinks  . Drug use: No  . Sexual activity: Yes    Partners: Female    Birth control/protection: None  Lifestyle  . Physical activity:    Days per week: Not on file    Minutes per session: Not on file  . Stress: Not on file  Relationships  . Social connections:    Talks on phone: Not on file    Gets together: Not on file    Attends religious service: Not on file    Active member of club or organization: Not on file    Attends meetings of clubs or organizations: Not on file    Relationship status: Not on file    . Intimate partner violence:    Fear of current or ex partner: Not on file    Emotionally abused: Not on file    Physically abused: Not on file    Forced sexual activity: Not on file  Other Topics Concern  . Not on file  Social History Narrative   Lives with wife. Does not work.  On disability.    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 Provided crisis assistance program information to assist with bills       Has issues getting food- gets $36/month in food stamps- already has list of food pantries but has not tried them- encouraged pt to try food pantries and reach out to clinic for help if needed    Current Outpatient Medications on File Prior to Visit  Medication Sig Dispense Refill  . ACCU-CHEK FASTCLIX LANCETS MISC USE AS DIRECTED TO  CHECK  BLOOD  SUGAR  3  TIMES  DAILY 102 each 11  . albuterol (PROVENTIL HFA;VENTOLIN HFA) 108 (90 Base) MCG/ACT inhaler Inhale 2 puffs into the lungs every 6 (six) hours as needed for wheezing or shortness of breath. 1 Inhaler 0  . allopurinol (ZYLOPRIM) 100 MG tablet Take 100 mg by mouth daily.    [name] aspirin (EQ ASPIRIN ADULT LOW DOSE) 81 MG EC tablet Take 1 tablet (81 mg total) by mouth daily. Swallow whole. 90 tablet 3  . atorvastatin (LIPITOR) 40 MG tablet Take 1 tablet (40 mg total) by mouth daily. 90 tablet 3  . Blood Pressure KIT 1 each by Does not apply route 2 (two) times daily. 1 each 0  . carvedilol (COREG) 12.5 MG tablet Take 1 tablet (12.5 mg total) by mouth 2 (two) times daily. 180 tablet 3  . DULoxetine (CYMBALTA) 20 MG capsule TAKE 1 CAPSULE BY MOUTH ONCE DAILY 30 capsule 2  . famotidine (PEPCID) 20 MG tablet Take 1 tablet (20 mg total) by mouth 2 (two) times daily. 60 tablet 6  . furosemide (LASIX) 40 MG tablet Take 1 tablet (40 mg total) by mouth 2 (two)  times daily. 180 tablet 3  . glucose blood (ACCU-CHEK AVIVA PLUS) test strip 1 each by Other route 2 (two) times daily. And lancets 2/day 200 each 3  . hydrALAZINE (APRESOLINE) 10 MG tablet Take 1 tablet (10 mg total) by mouth every 8 (eight) hours. 90 tablet 1  . HYDROcodone-acetaminophen (NORCO) 7.5-325 MG tablet Take 1 tablet by mouth 3 (three) times daily.  0  . isosorbide mononitrate (IMDUR)  30 MG 24 hr tablet Take 1 tablet (30 mg total) by mouth daily. 90 tablet 3  . Potassium Chloride ER 20 MEQ TBCR Take 20 mEq by mouth daily. 90 tablet 3  . predniSONE (DELTASONE) 10 MG tablet Take 10 mg by mouth daily with breakfast.    . pregabalin (LYRICA) 100 MG capsule Take 1 capsule (100 mg total) by mouth 3 (three) times daily. 90 capsule 3  . sacubitril-valsartan (ENTRESTO) 97-103 MG Take 1 tablet by mouth 2 (two) times daily. 60 tablet 6  . Secukinumab (COSENTYX) 150 MG/ML SOSY Inject 150 mg into the skin every 28 (twenty-eight) days.     . Tafamidis (VYNDAMAX) 61 MG CAPS Take 61 mg by mouth daily. 30 capsule 11  . zolpidem (AMBIEN CR) 6.25 MG CR tablet Take 1 tablet (6.25 mg total) by mouth at bedtime as needed for sleep. Each prescription to last 1 month. 30 tablet 3   No current facility-administered medications on file prior to visit.     Allergies  Allergen Reactions  . Acetaminophen     Family History  Problem Relation Age of Onset  . Hypertension Mother   . Diabetes Mother   . COPD Mother   . Lung cancer Mother        Smoker   . Hypertension Father   . Diabetes Father   . Heart Problems Father   . COPD Father   . Colon cancer Neg Hx   . Stomach cancer Neg Hx     BP 120/60 (BP Location: Right Arm, Patient Position: Sitting, Cuff Size: Large)   Pulse 78   Ht _0  (1.676 m)   Wt 240 lb 6.4 oz (109 kg)   SpO2 90%   BMI 38.80 kg/m    Review of Systems He denies hypoglycemia.      Objective:   Physical Exam VITAL SIGNS:  See vs page GENERAL: no distress Pulses:  dorsalis pedis intact bilat.   MSK: no deformity of the feet CV: 1+ bilat leg edema Skin:  no ulcer on the feet.  normal color and temp on the feet. Neuro: sensation is intact to touch on the feet, but decreased from normal.   Ext: There is bilateral onychomycosis of the toenails.    Lab Results  Component Value Date   HGBA1C 8.1 (A) 10/19/2018        Assessment & Plan:  HTN: is noted today Insulin-requiring type 2 DM, with CAD: he needs increased rx Renal failure: this increases the duration of action of insulin, so NPH looks best.   Patient Instructions  Your blood pressure is high today.  Please see your primary care provider soon, to have it rechecked.   check your blood sugar twice a day.  vary the time of day when you check, between before the 3 meals, and at bedtime.  also check if you have symptoms of your blood sugar being too high or too low.  please keep a record of the readings and bring it to your next appointment here (or you can bring the meter itself).  You can write it on any piece of paper.  please call us sooner if your blood sugar goes below 70, or if you have a lot of readings over 200.   Please increase the insulin to 150 units each morning.  I have sent a prescription to your pharmacy.   On this type of insulin schedule, you should eat meals on a regular schedule (especially lunch).  If a meal is missed or significantly delayed, your blood sugar could go low.   Please come back for a follow-up appointment in 2 months.

## 2018-10-19 NOTE — Patient Instructions (Addendum)
Your blood pressure is high today.  Please see your primary care provider soon, to have it rechecked.   check your blood sugar twice a day.  vary the time of day when you check, between before the 3 meals, and at bedtime.  also check if you have symptoms of your blood sugar being too high or too low.  please keep a record of the readings and bring it to your next appointment here (or you can bring the meter itself).  You can write it on any piece of paper.  please call us sooner if your blood sugar goes below 70, or if you have a lot of readings over 200.   Please increase the insulin to 150 units each morning.  I have sent a prescription to your pharmacy.   On this type of insulin schedule, you should eat meals on a regular schedule (especially lunch).  If a meal is missed or significantly delayed, your blood sugar could go low.   Please come back for a follow-up appointment in 2 months.

## 2018-10-20 ENCOUNTER — Ambulatory Visit: Payer: Medicaid Other | Attending: Family Medicine | Admitting: Family Medicine

## 2018-10-20 ENCOUNTER — Other Ambulatory Visit: Payer: Self-pay

## 2018-10-20 ENCOUNTER — Ambulatory Visit: Payer: Medicaid Other | Attending: Internal Medicine | Admitting: Physical Therapy

## 2018-10-20 ENCOUNTER — Encounter: Payer: Self-pay | Admitting: Physical Therapy

## 2018-10-20 ENCOUNTER — Encounter: Payer: Self-pay | Admitting: Family Medicine

## 2018-10-20 ENCOUNTER — Other Ambulatory Visit (HOSPITAL_COMMUNITY)
Admission: RE | Admit: 2018-10-20 | Discharge: 2018-10-20 | Disposition: A | Discharge status: Home / Self Care | Payer: Medicaid Other | Source: Ambulatory Visit | Attending: Family Medicine | Admitting: Family Medicine

## 2018-10-20 VITALS — BP 144/70 | HR 60 | Temp 98.0°F | Ht 66.0 in | Wt 239.0 lb

## 2018-10-20 VITALS — BP 152/75 | HR 62

## 2018-10-20 DIAGNOSIS — E1142 Type 2 diabetes mellitus with diabetic polyneuropathy: Secondary | ICD-10-CM

## 2018-10-20 DIAGNOSIS — M545 Low back pain, unspecified: Secondary | ICD-10-CM

## 2018-10-20 DIAGNOSIS — Z125 Encounter for screening for malignant neoplasm of prostate: Secondary | ICD-10-CM

## 2018-10-20 DIAGNOSIS — R3915 Urgency of urination: Secondary | ICD-10-CM | POA: Diagnosis not present

## 2018-10-20 DIAGNOSIS — R600 Localized edema: Secondary | ICD-10-CM | POA: Diagnosis not present

## 2018-10-20 DIAGNOSIS — R350 Frequency of micturition: Secondary | ICD-10-CM | POA: Insufficient documentation

## 2018-10-20 DIAGNOSIS — G8929 Other chronic pain: Secondary | ICD-10-CM

## 2018-10-20 DIAGNOSIS — M6281 Muscle weakness (generalized): Secondary | ICD-10-CM | POA: Insufficient documentation

## 2018-10-20 LAB — POCT URINALYSIS DIP (CLINITEK)
Bilirubin, UA: NEGATIVE
Blood, UA: NEGATIVE
Glucose, UA: NEGATIVE mg/dL
Ketones, POC UA: NEGATIVE mg/dL
Leukocytes, UA: NEGATIVE
Nitrite, UA: NEGATIVE
POC PROTEIN,UA: NEGATIVE
Spec Grav, UA: 1.015 (ref 1.010–1.025)
UROBILINOGEN UA: 0.2 U/dL
pH, UA: 7 (ref 5.0–8.0)

## 2018-10-20 LAB — GLUCOSE, POCT (MANUAL RESULT ENTRY): POC Glucose: 79 mg/dl (ref 70–99)

## 2018-10-20 NOTE — Therapy (Signed)
[place], [place], [address] Phone: [phone]   Fax:  [phone]  Physical Therapy Evaluation  Patient Details  Name: Marco [name] MRN: [id number] Date of Birth: 04/08/1956 Referring Provider (PT): [name], MD   Encounter Date: 10/20/2018    Past Medical History:  Diagnosis Date  . Benign colon polyp 08/01/2013   [facility] in [place]. large base tranverse colon polyp was biopsied. polyp was benign with minimal surface hyperplastic change.  . Chronic combined systolic and diastolic CHF (congestive heart failure) ([place])   . Chronic pain   . CKD (chronic kidney disease), stage III ([place])   . Diabetes mellitus without complication ([place])   . Diverticulosis   . Esophageal hiatal hernia 07/29/2013   confirmed on EGD   . Esophageal stricture   . Essential hypertension   . Gastritis 07/29/2013   confirmed on EGD, bx done an negative for intestinal metaplasia, dsyplasia or H. pylori. normal gastric emptying study done 07/13/2013.  [name] GERD (gastroesophageal reflux disease)   . Morbid obesity ([place])   . Non-obstructive CAD    a. 02/2013 Cath ([place]): nonobs dzs;  b. 09/2014 Myoview ([place]): EF 55%, no ischemia;  c. Cath 05/2018 mild CAD no obstruction.  . Persistent atrial fibrillation    a. 02/2013 s/[place] rfca in [place], [place]-->prev on Xarelto, d/c'd 2/2 anemia, ? GIB.  [name] Rheumatoid arthritis ([place])   . Sigmoid diverticulosis 08/01/2013   confirmed on colonscopy. record scanned into chart    Past Surgical History:  Procedure Laterality Date  . CARDIAC CATHETERIZATION  05/2014   ablation for atrial fibrillation  . CARDIAC CATHETERIZATION N/A 11/16/2015   Procedure: Left Heart Cath and Coronary Angiography;  Surgeon: Leonie Man, MD;  Location: [facility];  Service: Cardiovascular;  Laterality: N/A;  . COLON RESECTION  09/2013   due to large, abnormal polpy. non cancerous per  patient.   . COLON SURGERY  09/2014   colon resection   . LEFT HEART CATH AND CORONARY ANGIOGRAPHY N/A 05/19/2018   Procedure: LEFT HEART CATH AND CORONARY ANGIOGRAPHY;  Surgeon: Wellington Hampshire, MD;  Location: [facility];  Service: Cardiovascular;  Laterality: N/A;    Vitals:   10/20/18 1341  BP: (!) 152/75  Pulse: 62  SpO2: 94%     Subjective Assessment - 10/20/18 1338    Subjective  It is just excruciating pain when I stand any amount of time. I have always had slgiht back pain but became severe in last couple of months. Insidious onset, only care he has taken is pain meds. I have urinated about 6 times in last 1.5 hr and I am not feeling well.          [facility] PT Assessment - 10/20/18 0001      Assessment   Medical Diagnosis  LBP    Referring Provider (PT)  [name], MD              Plan - 10/20/18 1749    Clinical Impression Statement  Pt arrived to PT for complaints of LBP. However, pt reports he is not feeling well and urinated 6 times in the hour before his appointment. He also complains of feeling light-headed and severely fatigued. He is leaning over in the chair with his eyes closed while he speaks to me. BP was elevated somewhat. [facility] health and wellness was contacted to see the patient. Due to his symtpoms, I  did not feel that a PT exam was appropriate and pt will reschedule his initial evaluation.     Consulted and Agree with Plan of Care  Patient        Visit Diagnosis: Chronic bilateral low back pain without sciatica     Problem List Patient Active Problem List   Diagnosis Date Noted  . Gastroesophageal reflux disease without esophagitis 08/31/2018  . Muscle spasm 08/31/2018  . NICM (nonischemic cardiomyopathy) ([place]) 07/06/2018  . Hypokalemia   . Hyperlipidemia 12/23/2016  . Chronic combined systolic and diastolic CHF (congestive heart failure) ([place]) 09/09/2016  . Diabetic retinopathy ([place]) 07/22/2016  . Diabetic  polyneuropathy associated with type 2 diabetes mellitus ([place]) 06/10/2016  . Vision changes 06/02/2016  . Myalgia and myositis 03/31/2016  . Shortness of breath 02/09/2016  . Chronic gouty arthritis 11/29/2015  . LBP (low back pain) 11/27/2015  . Arthralgia of multiple joints 11/27/2015  . Psoriasis 11/27/2015  . Breast pain in male 11/14/2015  . Sinusitis, chronic 10/16/2015  . Insomnia 10/16/2015  . New onset of headaches after age 31 09/20/2015  . OSA (obstructive sleep apnea) 07/12/2015  . Low serum testosterone level 05/18/2015  . Depression 05/16/2015  . Morbid obesity due to excess calories ([place])   . Chronic pain   . CAD (coronary artery disease) 12/24/2014  . Atrial fibrillation ([place]) 12/24/2014  . Essential hypertension 12/24/2014  . Chronic kidney disease 12/24/2014  . PUD (peptic ulcer disease) 12/24/2014    [place] Kirchner C. Wynee Matarazzo PT, DPT 10/20/18 5:53 PM   [facility] [address] [place], [place], [address] Phone: [phone]   Fax:  [phone]  Name: Montez Cuda MRN: [id number] Date of Birth: 06/04/1956

## 2018-10-20 NOTE — Progress Notes (Signed)
Subjective:  Patient ID: Marco [name], male    DOB: [birth year]  Age: 63 y.o. MRN: [id number]  CC: Urinary Frequency   HPI Marco [name] is a 63 year old male with a history of type 2 diabetes mellitus (A1c 8.1), hypertension, gout, CKD, NICM (EF 40-45%) who was directed to the clinic from rehab medicine where he had complained of urinary frequency during his PT session. Over the last 24 hours he has suddenly developed increased urinary frequency and urgency with urination described as occurring every 1-2 hours.  He denies dysuria, penile pain, fever.  Also denies urinary hesitancy, sense of incomplete voiding, nocturia, abdominal pain. He is on Lasix chronically and does have slight swelling in his lower extremities. Denies flank pain but he does have chronic back pain for which he is undergoing physical therapy. His blood sugar is 79 in the clinic.  Past Medical History:  Diagnosis Date  . Benign colon polyp 08/01/2013   [facility] in [place]. large base tranverse colon polyp was biopsied. polyp was benign with minimal surface hyperplastic change.  . Chronic combined systolic and diastolic CHF (congestive heart failure) ([place])   . Chronic pain   . CKD (chronic kidney disease), stage III ([place])   . Diabetes mellitus without complication ([place])   . Diverticulosis   . Esophageal hiatal hernia 07/29/2013   confirmed on EGD   . Esophageal stricture   . Essential hypertension   . Gastritis 07/29/2013   confirmed on EGD, bx done an negative for intestinal metaplasia, dsyplasia or H. pylori. normal gastric emptying study done 07/13/2013.  [name] GERD (gastroesophageal reflux disease)   . Morbid obesity ([place])   . Non-obstructive CAD    a. 02/2013 Cath ([place]): nonobs dzs;  b. 09/2014 Myoview ([place]): EF 55%, no ischemia;  c. Cath 05/2018 mild CAD no obstruction.  . Persistent atrial fibrillation    a. 02/2013 s/p rfca in [place], [place]-->prev on Xarelto, d/c'd 2/2 anemia, ? GIB.  [name]  Rheumatoid arthritis ([place])   . Sigmoid diverticulosis 08/01/2013   confirmed on colonscopy. record scanned into chart    Past Surgical History:  Procedure Laterality Date  . CARDIAC CATHETERIZATION  05/2014   ablation for atrial fibrillation  . CARDIAC CATHETERIZATION N/A 11/16/2015   Procedure: Left Heart Cath and Coronary Angiography;  Surgeon: Leonie Man, MD;  Location: [place] [facility];  Service: Cardiovascular;  Laterality: N/A;  . COLON RESECTION  09/2013   due to large, abnormal polpy. non cancerous per patient.   . COLON SURGERY  09/2014   colon resection   . LEFT HEART CATH AND CORONARY ANGIOGRAPHY N/A 05/19/2018   Procedure: LEFT HEART CATH AND CORONARY ANGIOGRAPHY;  Surgeon: Wellington Hampshire, MD;  Location: [place] [facility];  Service: Cardiovascular;  Laterality: N/A;    Family History  Problem Relation Age of Onset  . Hypertension Mother   . Diabetes Mother   . COPD Mother   . Lung cancer Mother        Smoker   . Hypertension Father   . Diabetes Father   . Heart Problems Father   . COPD Father   . Colon cancer Neg Hx   . Stomach cancer Neg Hx     Allergies  Allergen Reactions  . Acetaminophen     Outpatient Medications Prior to Visit  Medication Sig Dispense Refill  . ACCU-CHEK FASTCLIX LANCETS MISC USE AS DIRECTED TO  CHECK  BLOOD  SUGAR  3  TIMES  DAILY 102 each 11  . albuterol (PROVENTIL HFA;VENTOLIN HFA) 108 (90 Base) MCG/ACT inhaler Inhale 2 puffs into the lungs every 6 (six) hours as needed for wheezing or shortness of breath. 1 Inhaler 0  . allopurinol (ZYLOPRIM) 100 MG tablet Take 100 mg by mouth daily.    [name] aspirin (EQ ASPIRIN ADULT LOW DOSE) 81 MG EC tablet Take 1 tablet (81 mg total) by mouth daily. Swallow whole. 90 tablet 3  . atorvastatin (LIPITOR) 40 MG tablet Take 1 tablet (40 mg total) by mouth daily. 90 tablet 3  . Blood Pressure KIT 1 each by Does not apply route 2 (two) times daily. 1 each 0  . carvedilol (COREG) 12.5 MG tablet Take  1 tablet (12.5 mg total) by mouth 2 (two) times daily. 180 tablet 3  . DULoxetine (CYMBALTA) 20 MG capsule TAKE 1 CAPSULE BY MOUTH ONCE DAILY 30 capsule 2  . famotidine (PEPCID) 20 MG tablet Take 1 tablet (20 mg total) by mouth 2 (two) times daily. 60 tablet 6  . furosemide (LASIX) 40 MG tablet Take 1 tablet (40 mg total) by mouth 2 (two) times daily. 180 tablet 3  . glucose blood (ACCU-CHEK AVIVA PLUS) test strip 1 each by Other route 2 (two) times daily. And lancets 2/day 200 each 3  . hydrALAZINE (APRESOLINE) 10 MG tablet Take 1 tablet (10 mg total) by mouth every 8 (eight) hours. 90 tablet 1  . HYDROcodone-acetaminophen (NORCO) 7.5-325 MG tablet Take 1 tablet by mouth 3 (three) times daily.  0  . insulin NPH Human (HUMULIN N) 100 UNIT/ML injection Inject 1.5 mLs (150 Units total) into the skin every morning. And syringes 2/day 50 mL 11  . isosorbide mononitrate (IMDUR) 30 MG 24 hr tablet Take 1 tablet (30 mg total) by mouth daily. 90 tablet 3  . Potassium Chloride ER 20 MEQ TBCR Take 20 mEq by mouth daily. 90 tablet 3  . predniSONE (DELTASONE) 10 MG tablet Take 10 mg by mouth daily with breakfast.    . pregabalin (LYRICA) 100 MG capsule Take 1 capsule (100 mg total) by mouth 3 (three) times daily. 90 capsule 3  . sacubitril-valsartan (ENTRESTO) 97-103 MG Take 1 tablet by mouth 2 (two) times daily. 60 tablet 6  . Secukinumab (COSENTYX) 150 MG/ML SOSY Inject 150 mg into the skin every 28 (twenty-eight) days.     . Tafamidis (VYNDAMAX) 61 MG CAPS Take 61 mg by mouth daily. 30 capsule 11  . zolpidem (AMBIEN CR) 6.25 MG CR tablet Take 1 tablet (6.25 mg total) by mouth at bedtime as needed for sleep. Each prescription to last 1 month. 30 tablet 3   No facility-administered medications prior to visit.      ROS Review of Systems  Constitutional: Negative for activity change and appetite change.  HENT: Negative for sinus pressure and sore throat.   Eyes: Negative for visual disturbance.   Respiratory: Negative for cough, chest tightness and shortness of breath.   Cardiovascular: Positive for leg swelling. Negative for chest pain.  Gastrointestinal: Negative for abdominal distention, abdominal pain, constipation and diarrhea.  Endocrine: Negative.   Genitourinary: Positive for frequency. Negative for dysuria.  Musculoskeletal: Negative for joint swelling and myalgias.  Skin: Negative for rash.  Allergic/Immunologic: Negative.   Neurological: Negative for weakness, light-headedness and numbness.  Psychiatric/Behavioral: Negative for dysphoric mood and suicidal ideas.    Objective:  BP (!) 144/70   Pulse 60   Temp 98 F (36.7 C) (Oral)   Ht 5'  6" (1.676 m)   Wt 239 lb (108.4 kg)   SpO2 96%   BMI 38.58 kg/m   BP/Weight 10/20/2018 10/20/2018 4/40/1027  Systolic BP 253 664 403  Diastolic BP 70 75 60  Wt. (Lbs) 239 - 240.4  BMI 38.58 - 38.8      Physical Exam Constitutional:      Appearance: He is well-developed.  Cardiovascular:     Rate and Rhythm: Normal rate.     Heart sounds: Normal heart sounds. No murmur.     Comments: No JVD Pulmonary:     Effort: Pulmonary effort is normal.     Breath sounds: Normal breath sounds. No wheezing or rales.  Chest:     Chest wall: No tenderness.  Abdominal:     General: Bowel sounds are normal. There is no distension.     Palpations: Abdomen is soft. There is no mass.     Tenderness: There is no abdominal tenderness.  Musculoskeletal: Normal range of motion.     Right lower leg: Edema (1+) present.     Left lower leg: Edema (1+) present.  Neurological:     Mental Status: He is alert and oriented to person, place, and time.     CMP Latest Ref Rng & Units 09/13/2018 08/31/2018 07/22/2018  Glucose 65 - 99 mg/dL - 180(H) 170(H)  BUN 8 - 27 mg/dL - 19 9  Creatinine 0.76 - 1.27 mg/dL - 1.59(H) 1.33(H)  Sodium 134 - 144 mmol/L - 141 142  Potassium 3.5 - 5.2 mmol/L - 3.7 3.7  Chloride 96 - 106 mmol/L - 101 105  CO2 20 -  29 mmol/L - 23 27  Calcium 8.6 - 10.2 mg/dL - 8.9 8.6(L)  Total Protein 6.0 - 8.5 g/dL 7.0 - -  Total Bilirubin 0.0 - 1.2 mg/dL 0.3 - -  Alkaline Phos 39 - 117 IU/L 69 - -  AST 0 - 40 IU/L 11 - -  ALT 0 - 44 IU/L 21 - -    Lipid Panel     Component Value Date/Time   CHOL 218 (H) 09/13/2018 1044   TRIG 214 (H) 09/13/2018 1044   HDL 32 (L) 09/13/2018 1044   CHOLHDL 6.8 (H) 09/13/2018 1044   CHOLHDL 2.6 06/10/2016 1253   VLDL 18 06/10/2016 1253   LDLCALC 143 (H) 09/13/2018 1044    CBC    Component Value Date/Time   WBC 7.9 05/20/2018 0420   RBC 4.68 05/20/2018 0420   HGB 13.8 05/20/2018 0420   HGB 14.4 04/29/2018 1717   HCT 43.9 05/20/2018 0420   HCT 42.7 04/29/2018 1717   PLT 228 05/20/2018 0420   PLT 294 04/29/2018 1717   MCV 93.8 05/20/2018 0420   MCV 89 04/29/2018 1717   MCH 29.5 05/20/2018 0420   MCHC 31.4 05/20/2018 0420   RDW 15.2 05/20/2018 0420   RDW 13.2 04/29/2018 1717   LYMPHSABS 1.4 04/29/2018 1717   MONOABS 1.1 (H) 08/24/2017 1819   EOSABS 0.0 04/29/2018 1717   BASOSABS 0.1 04/29/2018 1717    Lab Results  Component Value Date   HGBA1C 8.1 (A) 10/19/2018    Assessment & Plan:   1. Diabetic polyneuropathy associated with type 2 diabetes mellitus (HCC) A1c of 8.1 Will be returning for chronic care management with PCP - POCT glucose (manual entry)  2. Frequent urination UA negative for UTI, no hyperglycemia with glucose of 79 in the clinic. Symptoms of acute onset, lower urinary tract symptoms are absent PSA previously ordered  by PCP and we will draw this today If work-up is unrevealing; I will place him on Flomax - POCT URINALYSIS DIP (CLINITEK) - CBC with Differential/Platelet - Urine cytology ancillary only - Urine Culture   No orders of the defined types were placed in this encounter.   Follow-up: Return for follow up of chronic medical conditions keep previously scheduled appointment with PCP.       Charlott Rakes, MD, FAAFP.  [facility] [place], [place]   10/20/2018, 3:54 PM

## 2018-10-21 LAB — CBC WITH DIFFERENTIAL/PLATELET
BASOS ABS: 0.1 10*3/uL (ref 0.0–0.2)
Basos: 1 %
EOS (ABSOLUTE): 0.1 10*3/uL (ref 0.0–0.4)
Eos: 1 %
Hematocrit: 41.5 % (ref 37.5–51.0)
Hemoglobin: 13.5 g/dL (ref 13.0–17.7)
Immature Grans (Abs): 0 10*3/uL (ref 0.0–0.1)
Immature Granulocytes: 1 %
Lymphocytes Absolute: 1.8 10*3/uL (ref 0.7–3.1)
Lymphs: 22 %
MCH: 29 pg (ref 26.6–33.0)
MCHC: 32.5 g/dL (ref 31.5–35.7)
MCV: 89 fL (ref 79–97)
Monocytes Absolute: 0.9 10*3/uL (ref 0.1–0.9)
Monocytes: 11 %
Neutrophils Absolute: 5.2 10*3/uL (ref 1.4–7.0)
Neutrophils: 64 %
Platelets: 256 10*3/uL (ref 150–450)
RBC: 4.66 x10E6/uL (ref 4.14–5.80)
RDW: 14.2 % (ref 11.6–15.4)
WBC: 8.1 10*3/uL (ref 3.4–10.8)

## 2018-10-21 LAB — URINE CYTOLOGY ANCILLARY ONLY
Chlamydia: NEGATIVE
Neisseria Gonorrhea: NEGATIVE
Trichomonas: NEGATIVE

## 2018-10-21 LAB — PSA: Prostate Specific Ag, Serum: 1.2 ng/mL (ref 0.0–4.0)

## 2018-10-22 ENCOUNTER — Telehealth: Payer: Self-pay

## 2018-10-22 LAB — URINE CULTURE

## 2018-10-22 NOTE — Telephone Encounter (Signed)
-----   Message from Charlott Rakes, MD sent at 10/21/2018  2:41 PM EDT ----- Please inform the patient that labs are normal. Thank you.

## 2018-10-22 NOTE — Telephone Encounter (Signed)
Patient was called and informed of lab results. Patient had no questions.  

## 2018-10-28 ENCOUNTER — Ambulatory Visit: Payer: Medicaid Other | Admitting: Physical Therapy

## 2018-10-28 ENCOUNTER — Other Ambulatory Visit: Payer: Self-pay

## 2018-10-28 DIAGNOSIS — G8929 Other chronic pain: Secondary | ICD-10-CM

## 2018-10-28 DIAGNOSIS — M6281 Muscle weakness (generalized): Secondary | ICD-10-CM | POA: Diagnosis present

## 2018-10-28 DIAGNOSIS — M545 Low back pain: Secondary | ICD-10-CM | POA: Diagnosis present

## 2018-10-28 NOTE — Therapy (Signed)
[name], [place], [address] Phone: [phone]   Fax:  [phone]  Physical Therapy Evaluation  Patient Details  Name: Marco [name] MRN: [id number] Date of Birth: Jun 17, 1956 Referring Provider (PT): [name], MD   Encounter Date: 10/28/2018  PT End of Session - 10/28/18 1116    Visit Number  1    Number of Visits  4    Date for PT Re-Evaluation  12/02/18    Authorization Type  Medicaid    Authorization Time Period  awaiting approval of 3 visits    PT Start Time  0930    PT Stop Time  1016    PT Time Calculation (min)  46 min    Activity Tolerance  Patient tolerated treatment well    Behavior During Therapy  [facility] for tasks assessed/performed       Past Medical History:  Diagnosis Date  . Benign colon polyp 08/01/2013   [facility] in [place]. large base tranverse colon polyp was biopsied. polyp was benign with minimal surface hyperplastic change.  . Chronic combined systolic and diastolic CHF (congestive heart failure) ([place])   . Chronic pain   . CKD (chronic kidney disease), stage III ([place])   . Diabetes mellitus without complication ([place])   . Diverticulosis   . Esophageal hiatal hernia 07/29/2013   confirmed on EGD   . Esophageal stricture   . Essential hypertension   . Gastritis 07/29/2013   confirmed on EGD, bx done an negative for intestinal metaplasia, dsyplasia or H. pylori. normal gastric emptying study done 07/13/2013.  [name] GERD (gastroesophageal reflux disease)   . Morbid obesity ([place])   . Non-obstructive CAD    a. 02/2013 Cath ([place]): nonobs dzs;  b. 09/2014 Myoview ([place]): EF 55%, no ischemia;  c. Cath 05/2018 mild CAD no obstruction.  . Persistent atrial fibrillation    a. 02/2013 s/p rfca in [place], [place]-->prev on Xarelto, d/c'd 2/2 anemia, ? GIB.  [name] Rheumatoid arthritis ([place])   . Sigmoid diverticulosis 08/01/2013   confirmed on colonscopy. record scanned into  chart    Past Surgical History:  Procedure Laterality Date  . CARDIAC CATHETERIZATION  05/2014   ablation for atrial fibrillation  . CARDIAC CATHETERIZATION N/A 11/16/2015   Procedure: Left Heart Cath and Coronary Angiography;  Surgeon: Leonie Man, MD;  Location: [place] [facility];  Service: Cardiovascular;  Laterality: N/A;  . COLON RESECTION  09/2013   due to large, abnormal polpy. non cancerous per patient.   . COLON SURGERY  09/2014   colon resection   . LEFT HEART CATH AND CORONARY ANGIOGRAPHY N/A 05/19/2018   Procedure: LEFT HEART CATH AND CORONARY ANGIOGRAPHY;  Surgeon: Wellington Hampshire, MD;  Location: [facility] [facility];  Service: Cardiovascular;  Laterality: N/A;    There were no vitals filed for this visit.   Subjective Assessment - 10/28/18 0932    Subjective  Pt relays the back pain is killing me, I'm in excrutiating pain and I cant stand more than 5 minutes before being in pain. He has pain and difficulty bending forward and putting on shoes. He only gets relief with sitting and resting, has not tried heat, ice, or TENS. He is on narcotics through [facility] for chronic pain in chest/back/shoulders/neck,  Insomnia. Relays the pain in the back has become worse and is a new pain over last 2 weeks     Pertinent History  PMH: HTN, DM 2  with neuropathy, retinopathy and nephropathy, CKD stage III, obesity, nonobstructive CAD, chronic combined CHF, OSA on VPAP, followed by rheumatology in [facility] for psoriasis/gout/nonspec arthritis. On narcotics through [facility] for chronic pain in chest/back/shoulders/neck,  Insomnia    Limitations  Lifting;Standing;Walking;House hold activities    How long can you sit comfortably?  not limited    How long can you stand comfortably?  5 min at best    How long can you walk comfortably?  2 minutes    Diagnostic tests  Imaging: new XR ordred, no lumbar images since 2018 "mild DDD"    Patient Stated Goals  Improve the pain make it tolerable     Currently in Pain?  Yes    Pain Score  9     Pain Location  Back    Pain Orientation  Right;Left;Lower    Pain Descriptors / Indicators  --   excrutiating   Pain Type  Chronic pain   acute on chronic   Pain Radiating Towards  buttock    Pain Onset  More than a month ago    Pain Frequency  Constant    Aggravating Factors   walking, standing, bending over    Pain Relieving Factors  rest and sitting         [facility] PT Assessment - 10/28/18 0001      Assessment   Medical Diagnosis  Chronic LBP    Referring Provider (PT)  [name], MD    Onset Date/Surgical Date  --   chronic pain but worse over last 2 weeks   Next MD Visit  12/10/18    Prior Therapy  yes, for falls last year      Balance Screen   Has the patient fallen in the past 6 months  No   he relays no but was in PT for fall in december 2019?     Home Environment   Living Environment  Private residence    Living Arrangements  Spouse/significant other    Additional Comments  single level      Prior Function   Level of Independence  Independent with basic ADLs   but pain and has to take many breaks   Leisure  church      Observation/Other Assessments   Focus on Therapeutic Outcomes (FOTO)   not done MCD      Sensation   Light Touch  Appears Intact      Coordination   Gross Motor Movements are Fluid and Coordinated  Yes      Posture/Postural Control   Posture Comments  fwd slumped posture      ROM / Strength   AROM / PROM / Strength  AROM;Strength      AROM   Overall AROM Comments  % available    AROM Assessment Site  Lumbar    Lumbar Flexion  25%    Lumbar Extension  10%    Lumbar - Right Side Bend  50%    Lumbar - Left Side Bend  50%    Lumbar - Right Rotation  50%    Lumbar - Left Rotation  50%      Strength   Overall Strength Comments  leg strength 3+/5 MMT bilat but poor effort given due to pain      Flexibility   Soft Tissue Assessment /Muscle Length  --   SLR limited to 10 deg but  more due to pain than tighntess     Palpation  Palpation comment  very TTP in lumbar spine and paraspinals with increased muscle guarding and spasm in paraspinals      Special Tests   Other special tests  + slump test,  and SLR test, and quadrant test but high irritability of symptoms may be leading to false positives      Transfers   Transfers  Independent with all Transfers      Ambulation/Gait   Gait Comments  slow antalgic gait with fwd posture                Objective measurements completed on examination: See above findings.      [facility] Adult PT Treatment/Exercise - 10/28/18 0001      Modalities   Modalities  Electrical Stimulation;Moist Heat      Moist Heat Therapy   Number Minutes Moist Heat  15 Minutes    Moist Heat Location  Lumbar Spine      Electrical Stimulation   Electrical Stimulation Location  lumbar    Electrical Stimulation Action  IFC    Electrical Stimulation Parameters  tolerance    Electrical Stimulation Goals  Tone;Pain             PT Education - 10/28/18 1115    Education Details  HEP, POC, sleep positions, pain science, TENS    Person(s) Educated  Patient    Methods  Explanation;Demonstration;Verbal cues;Handout    Comprehension  Verbalized understanding;Need further instruction          PT Long Term Goals - 10/28/18 1149      PT LONG TERM GOAL #1   Title  Pt will be independent with HEP. 4 weeks (12/02/18)    Baseline  no HEP for lumbar program until today    Time  4    Period  Weeks    Status  New      PT LONG TERM GOAL #2   Title  Pt will have WFL lumbar ROM >60% all planes. 4 weeks (12/02/18)    Baseline  10-50% ROM available.     Time  4    Period  Weeks    Status  New      PT LONG TERM GOAL #3   Title  Pt will improve LE strength to at least 4+/5 MMT to improve function. 4 weeks (12/02/18)    Baseline  3+/5 MMT overall    Time  4    Period  Weeks    Status  New      PT LONG TERM GOAL #4   Title  Pt will  be able to stand >15 minutes for ambulation in community and for ADL's with tolerable amount of pain. 4 weeks (12/02/18)    Baseline  can only stand 5 min before 10/10 pain    Time  4    Period  Weeks    Status  New             Plan - 10/28/18 1117    Clinical Impression Statement  Pt presents with acute exacerbation of chronic LBP. He has widespread chronic pain and is in pain managment program for this. His pain appears mostly muscular at this time as he has a lot of pain, spasm, tightness, and guarding in his lumbar P.S and this is the area of most of this pain. He does have a new order for lumbar XR as he has not had one since 2018 showing only mild DDD. He has overall decreased lumbar ROM, decreased  standing activity tolerance, decreased core and LE strength, and increased pain limiting his function. He will benefit from skilled PT to address these defecits.     Personal Factors and Comorbidities  Comorbidity 1;Comorbidity 2;Comorbidity 3+;Past/Current Experience;Transportation    Comorbidities  PMH: HTN, DM 2 with neuropathy, retinopathy and nephropathy, CKD stage III, obesity, nonobstructive CAD, chronic combined CHF, OSA on VPAP, followed by rheumatology in [facility] for psoriasis/gout/nonspec arthritis. On narcotics through [facility] for chronic pain in chest/back/shoulders/neck,  Insomnia    Examination-Activity Limitations  Hygiene/Grooming;Squat;Lift;Stairs;Bend;Stand;Locomotion Level;Transfers    Examination-Participation Restrictions  Church;Cleaning;Community Activity;Meal Prep    Stability/Clinical Decision Making  Evolving/Moderate complexity    Clinical Decision Making  Moderate    Clinical Presentation due to:  PMH: HTN, DM 2 with neuropathy, retinopathy and nephropathy, CKD stage III, obesity, nonobstructive CAD, chronic combined CHF, OSA on VPAP, followed by rheumatology in [facility] for psoriasis/gout/nonspec arthritis. On narcotics through [facility] for chronic pain  in chest/back/shoulders/neck,  Insomnia    Rehab Potential  Fair    PT Frequency  1x / week    PT Duration  4 weeks    PT Treatment/Interventions  ADLs/Self Care Home Management;spinal or joint mobilizaiton/manipulation ;heat, cryotherapy, U.S, Electrical stimulation, traction, Therapeutic activities;Therapeutic exercise;Balance training;Neuromuscular re-education;Patient/family education;   PT Next Visit Plan  needs more pain science education like hurt doesnt mean harm, alarm system too active and need motion and stretching and exercise to decrease alarm and guarding. Review HEP and begin stretching, core, standing tolerance, MT and modalties for pain    PT Home Exercise Plan  piriformis stretch, HSS, LTR, standing hip ext and abd.     Consulted and Agree with Plan of Care  Patient       Patient will benefit from skilled therapeutic intervention in order to improve the following deficits and impairments:  Abnormal gait, Decreased activity tolerance, Decreased endurance, Decreased range of motion, Decreased strength, Difficulty walking, Hypomobility, Increased fascial restricitons, Increased muscle spasms, Impaired flexibility, Obesity, Postural dysfunction, Improper body mechanics, Pain  Visit Diagnosis: Chronic bilateral low back pain without sciatica  Muscle weakness (generalized)     Problem List Patient Active Problem List   Diagnosis Date Noted  . Gastroesophageal reflux disease without esophagitis 08/31/2018  . Muscle spasm 08/31/2018  . NICM (nonischemic cardiomyopathy) ([place]) 07/06/2018  . Hypokalemia   . Hyperlipidemia 12/23/2016  . Chronic combined systolic and diastolic CHF (congestive heart failure) ([place]) 09/09/2016  . Diabetic retinopathy ([place]) 07/22/2016  . Diabetic polyneuropathy associated with type 2 diabetes mellitus ([place]) 06/10/2016  . Vision changes 06/02/2016  . Myalgia and myositis 03/31/2016  . Shortness of breath 02/09/2016  . Chronic gouty arthritis  11/29/2015  . LBP (low back pain) 11/27/2015  . Arthralgia of multiple joints 11/27/2015  . Psoriasis 11/27/2015  . Breast pain in male 11/14/2015  . Sinusitis, chronic 10/16/2015  . Insomnia 10/16/2015  . New onset of headaches after age 82 09/20/2015  . OSA (obstructive sleep apnea) 07/12/2015  . Low serum testosterone level 05/18/2015  . Depression 05/16/2015  . Morbid obesity due to excess calories ([place])   . Chronic pain   . CAD (coronary artery disease) 12/24/2014  . Atrial fibrillation ([place]) 12/24/2014  . Essential hypertension 12/24/2014  . Chronic kidney disease 12/24/2014  . PUD (peptic ulcer disease) 12/24/2014    Debbe Odea ,PT,DPT 10/28/2018, 11:56 AM  [facility] [address] [place], [place], [address] Phone: [phone]   Fax:  [phone]  Name: Marco [name] MRN: [id number] Date of Birth: 1955/10/09

## 2018-10-28 NOTE — Patient Instructions (Signed)
Access Code: [email or web address]  URL: [email or web address]  Date: 10/28/2018  Prepared by: Elsie Ra   Exercises  Supine Piriformis Stretch - 3 reps - 1 sets - 30 hold - 2x daily - 6x weekly  Supine Hamstring Stretch with Strap - 3 reps - 1 sets - 30 hold - 2x daily - 6x weekly  Supine Lower Trunk Rotation - 2-3 reps - 1 sets - 10 hold - 2x daily - 6x weekly  Standing Hip Abduction - 10 reps - 3 sets - 2x daily - 6x weekly  Standing Hip Extension - 10 reps - 3 sets - 2x daily - 6x weekly   TENS UNIT: This is helpful for muscle pain and spasm.   Search and Purchase a TENS 7000 2nd edition at www.tenspros.com. It should be less than $30.     TENS unit instructions: Do not shower or bathe with the unit on Turn the unit off before removing electrodes or batteries If the electrodes lose stickiness add a drop of water to the electrodes after they are disconnected from the unit and place on plastic sheet. If you continued to have difficulty, call the TENS unit company to purchase more electrodes. Do not apply lotion on the skin area prior to use. Make sure the skin is clean and dry as this will help prolong the life of the electrodes. After use, always check skin for unusual red areas, rash or other skin difficulties. If there are any skin problems, does not apply electrodes to the same area. Never remove the electrodes from the unit by pulling the wires. Do not use the TENS unit or electrodes other than as directed. Do not change electrode placement without consultating your therapist or physician. Keep 2 fingers with between each electrode. Wear time ratio is 2:1, on to off times.    For example on for 30 minutes off for 15 minutes and then on for 30 minutes off for 15 minutes

## 2018-11-01 ENCOUNTER — Other Ambulatory Visit: Payer: Medicaid Other

## 2018-11-04 ENCOUNTER — Ambulatory Visit: Payer: Medicaid Other | Admitting: Physical Therapy

## 2018-11-08 ENCOUNTER — Encounter: Payer: Medicaid Other | Admitting: Physical Therapy

## 2018-11-10 ENCOUNTER — Telehealth: Payer: Self-pay | Admitting: Physical Therapy

## 2018-11-10 NOTE — Telephone Encounter (Signed)
Left patient voicemail regarding current state of our clinic. Patient advised to call back to discuss his case. The patient may be a candidate for a follow up visit depending on how his symptoms have progressed. Patient given clinic number to call.

## 2018-11-16 ENCOUNTER — Telehealth: Payer: Self-pay | Admitting: Physical Therapy

## 2018-11-16 ENCOUNTER — Ambulatory Visit: Payer: Medicaid Other | Admitting: Physical Therapy

## 2018-11-16 NOTE — Telephone Encounter (Signed)
Called a 2nd time with no answer. Patient has the clinic number and a message already stating to call back if needed. Therapy will follow up when the clinic is set to re-open.

## 2018-12-05 ENCOUNTER — Other Ambulatory Visit (HOSPITAL_COMMUNITY): Payer: Self-pay | Admitting: Student

## 2018-12-05 ENCOUNTER — Other Ambulatory Visit: Payer: Self-pay | Admitting: Internal Medicine

## 2018-12-06 ENCOUNTER — Other Ambulatory Visit: Payer: Self-pay

## 2018-12-06 DIAGNOSIS — Z794 Long term (current) use of insulin: Principal | ICD-10-CM

## 2018-12-06 DIAGNOSIS — E118 Type 2 diabetes mellitus with unspecified complications: Secondary | ICD-10-CM

## 2018-12-06 MED ORDER — GLUCOSE BLOOD VI STRP: ORAL_STRIP | 12 refills | Status: DC

## 2018-12-06 MED ORDER — ACCU-CHEK GUIDE W/DEVICE KIT: 1.0000 | PACK | Freq: Two times a day (BID) | 0 refills | Status: DC

## 2018-12-06 MED ORDER — ACCU-CHEK FASTCLIX LANCETS MISC: 11 refills | Status: DC

## 2018-12-10 ENCOUNTER — Other Ambulatory Visit: Payer: Self-pay

## 2018-12-10 ENCOUNTER — Ambulatory Visit: Payer: Medicaid Other | Attending: Internal Medicine | Admitting: Internal Medicine

## 2018-12-10 DIAGNOSIS — M5442 Lumbago with sciatica, left side: Secondary | ICD-10-CM | POA: Diagnosis not present

## 2018-12-10 DIAGNOSIS — E854 Organ-limited amyloidosis: Secondary | ICD-10-CM | POA: Diagnosis not present

## 2018-12-10 DIAGNOSIS — E118 Type 2 diabetes mellitus with unspecified complications: Secondary | ICD-10-CM | POA: Diagnosis not present

## 2018-12-10 DIAGNOSIS — Z794 Long term (current) use of insulin: Secondary | ICD-10-CM

## 2018-12-10 DIAGNOSIS — I5042 Chronic combined systolic (congestive) and diastolic (congestive) heart failure: Secondary | ICD-10-CM

## 2018-12-10 DIAGNOSIS — IMO0002 Reserved for concepts with insufficient information to code with codable children: Secondary | ICD-10-CM

## 2018-12-10 DIAGNOSIS — G8929 Other chronic pain: Secondary | ICD-10-CM

## 2018-12-10 DIAGNOSIS — I43 Cardiomyopathy in diseases classified elsewhere: Secondary | ICD-10-CM

## 2018-12-10 DIAGNOSIS — E1165 Type 2 diabetes mellitus with hyperglycemia: Secondary | ICD-10-CM

## 2018-12-10 NOTE — Progress Notes (Signed)
Virtual Visit via Telephone Note  I connected with Marco [name] on 12/10/18 at 2:21 p.m EDT by telephone  from my office and verified that I am speaking with the correct person using two identifiers.  Patient is at home.  Only the patient and myself participated in this encounter.   I discussed the limitations, risks, security and privacy concerns of performing an evaluation and management service by telephone and the availability of in person appointments. I also discussed with the patient that there may be a patient responsible charge related to this service. The patient expressed understanding and agreed to proceed.   History of Present Illness: 63 year old male with history of HTN,DM2 with neuropathy, retinopathyand nephropathy, CKD stage III, obesity, nonobstructive CAD/cardiac amyloid, chronic combined CHF, OSA on VPAP, followed by rheumatology in [facility] for psoriasis/gout/nonspec arthritis. On narcotics through [facility] for chronic pain inchest/back/shoulders/neck,Insomnia  Chronic LBP: for over 6 mths.  Referred to P.T and for x-ray on last visit.  He saw P.T only once because COVID19 pandemic started shortly after 1st visit.  Did not get to have the x-ray done -pain across lower back Radiates at times to LT leg.  No numbness in thighs, legs. Worse when he stands for more than 5 mins and when he starts moving around doing his usual daily activities like cleaning dishes or bending over.  Pain disturbs sleep.  Worse when he sleeps in his bed.  Better when he sleeps in his recliner     Rates pain as 10/10 most of the times.  No better with Norco.  He is also on Cymbalta and Lyrica and reports that even with these his pain is still 10/10.  DM:  Saw [name] 10/2018.  NPH inc to 150 units/daily Checks 1-2 x a day.  Morning BS 90-160.  This morning BS was 160.  Bedtime 200-220 Eating habits are good.  "I don't eat fast foods and I don't fried foods often."  Did not get started  with wgh management program through his pain specialist that he mentioned on last visit Activity level very low due to pain  HTN/CAD/cardiac amyloidosis:  Compliant with meds No CP/LE edema/SOB Sleeps in his recliner more so due to lower back pain Next appt with cardiology is later this mth  Patient states that he has been receiving calls from [facility] oncology for an appointment and is not sure why or who referred him.  I told him that I did not submit a referral to [facility] oncology.  I did submit a referral to [facility] oncology back in December when he had the abnormal SPEP to eval for MM.  That appointment was scheduled with Dr. Walden Field but patient states that he did not not keep the appointment because he was told by cardiologist  that he does not have what I was concern about being MM  Observations/Objective: No direct observations done  Assessment and Plan: 1. Uncontrolled type 2 diabetes mellitus with complication, with long-term current use of insulin (HCC) Not at goal.  We will have him bump up the NPH by 3 units.  Continue to encourage healthy eating habits.  He will continue to monitor blood sugars. Increase NPH insulin to 153 units once a day  2. Chronic low back pain with left-sided sciatica, unspecified back pain laterality We need some updated imaging.  Advised patient to move forward with getting the x-ray done on the lower back as was ordered on his last visit.  He will call  the radiology department at [facility] to see when they will start allowing patients to come in to have routine x-rays done.  Once we get the results of the x-ray, next step would probably be an MRI then referral to a spine specialist depending on results of the imaging studies.    3. Chronic combined systolic and diastolic CHF, NYHA class 2 (HCC) 4. Cardiac amyloidosis (HCC) Stable.  He will keep his follow-up appointment with cardiology   Follow Up Instructions: Follow-up in 2 to 3 months   I  discussed the assessment and treatment plan with the patient. The patient was provided an opportunity to ask questions and all were answered. The patient agreed with the plan and demonstrated an understanding of the instructions.   The patient was advised to call back or seek an in-person evaluation if the symptoms worsen or if the condition fails to improve as anticipated.  I provided 24 minutes of non-face-to-face time during this encounter.   Karle Plumber, MD

## 2018-12-10 NOTE — Progress Notes (Signed)
Pt states he was sent to PT and he only has been one time  Pt states he wants to know why is not seeing a doctor for his back  Pt states the [facility] doctor has been calling him for an appointment and he doesn't know why he needs to go

## 2018-12-15 ENCOUNTER — Telehealth: Payer: Self-pay | Admitting: Physical Therapy

## 2018-12-15 NOTE — Telephone Encounter (Signed)
Contacted patient to offer appointment to resume Physical Therapy in the clinic. Left message for patient to contact the [facility] if he would like to schedule an appointment for June. Left clinic contact number.

## 2018-12-17 ENCOUNTER — Encounter: Payer: Self-pay | Admitting: Endocrinology

## 2018-12-20 ENCOUNTER — Other Ambulatory Visit: Payer: Self-pay

## 2018-12-20 ENCOUNTER — Ambulatory Visit (INDEPENDENT_AMBULATORY_CARE_PROVIDER_SITE_OTHER): Payer: Medicaid Other | Admitting: Endocrinology

## 2018-12-20 DIAGNOSIS — E1142 Type 2 diabetes mellitus with diabetic polyneuropathy: Secondary | ICD-10-CM | POA: Diagnosis not present

## 2018-12-20 NOTE — Patient Instructions (Signed)
check your blood sugar twice a day.  vary the time of day when you check, between before the 3 meals, and at bedtime.  also check if you have symptoms of your blood sugar being too high or too low.  please keep a record of the readings and bring it to your next appointment here (or you can bring the meter itself).  You can write it on any piece of paper.  please call us sooner if your blood sugar goes below 70, or if you have a lot of readings over 200.   Please continue the same insulin.   On this type of insulin schedule, you should eat meals on a regular schedule (especially lunch).  If a meal is missed or significantly delayed, your blood sugar could go low.   Please come back for a follow-up appointment in 2 months.

## 2018-12-20 NOTE — Progress Notes (Signed)
Subjective:    Patient ID: Marco [name], male    DOB: 1956/02/02, 63 y.o.   MRN: [id number]  HPI  telehealth visit today via doxy video visit.  Alternatives to telehealth are presented to this patient, and the patient agrees to the telehealth visit. Pt is advised of the cost of the visit, and agrees to this, also.   Patient is at home, and I am at the office.   Persons attending the telehealth visit: the patient and I Pt returns for f/u of diabetes mellitus: DM type: Insulin-requiring type 2 Dx'ed: 1607 Complications: polyneuropathy, CAD, NPDR, and renal failure.  Therapy: insulin since soon after dx DKA: never Severe hypoglycemia: never.  Pancreatitis: never.  Pancreatic imaging: never.  Other: he chronically takes prednisone, for psoriasis, with no plan for taper; he declined to continue multiple daily injections.   Interval history: no cbg record, but states cbg's are in the mid-100's fasting.  He does not check later in the day, but he says he never misses the insulin.  pt states he feels well in general.  No change in chronic prednisone dosage (5 mg qd).   Past Medical History:  Diagnosis Date  . Benign colon polyp 08/01/2013   [facility] in [place]. large base tranverse colon polyp was biopsied. polyp was benign with minimal surface hyperplastic change.  . Chronic combined systolic and diastolic CHF (congestive heart failure) ([place])   . Chronic pain   . CKD (chronic kidney disease), stage III ([place])   . Diabetes mellitus without complication ([place])   . Diverticulosis   . Esophageal hiatal hernia 07/29/2013   confirmed on EGD   . Esophageal stricture   . Essential hypertension   . Gastritis 07/29/2013   confirmed on EGD, bx done an negative for intestinal metaplasia, dsyplasia or H. pylori. normal gastric emptying study done 07/13/2013.  [name] GERD (gastroesophageal reflux disease)   . Morbid obesity ([place])   . Non-obstructive CAD    a. 02/2013 Cath ([place]): nonobs  dzs;  b. 09/2014 Myoview ([place]): EF 55%, no ischemia;  c. Cath 05/2018 mild CAD no obstruction.  . Persistent atrial fibrillation    a. 02/2013 s/p rfca in [place], [place]-->prev on Xarelto, d/c'd 2/2 anemia, ? GIB.  [name] Rheumatoid arthritis ([place])   . Sigmoid diverticulosis 08/01/2013   confirmed on colonscopy. record scanned into chart    Past Surgical History:  Procedure Laterality Date  . CARDIAC CATHETERIZATION  05/2014   ablation for atrial fibrillation  . CARDIAC CATHETERIZATION N/A 11/16/2015   Procedure: Left Heart Cath and Coronary Angiography;  Surgeon: Leonie Man, MD;  Location: [facility];  Service: Cardiovascular;  Laterality: N/A;  . COLON RESECTION  09/2013   due to large, abnormal polpy. non cancerous per patient.   . COLON SURGERY  09/2014   colon resection   . LEFT HEART CATH AND CORONARY ANGIOGRAPHY N/A 05/19/2018   Procedure: LEFT HEART CATH AND CORONARY ANGIOGRAPHY;  Surgeon: Wellington Hampshire, MD;  Location: [facility];  Service: Cardiovascular;  Laterality: N/A;    Social History   Socioeconomic History  . Marital status: Married    Spouse name: Amethyst  . Number of children: 8  . Years of education: 71  . Highest education level: Some college, no degree  Occupational History    Comment: disabled  Social Needs  . Financial resource strain: Hard  . Food insecurity:    Worry: Often true    Inability:  Often true  . Transportation needs:    Medical: Yes    Non-medical: Yes  Tobacco Use  . Smoking status: Former Smoker    Packs/day: 0.50    Years: 10.00    Pack years: 5.00    Types: Cigarettes    Last attempt to quit: 04/11/2008    Years since quitting: 10.6  . Smokeless tobacco: Never Used  Substance and Sexual Activity  . Alcohol use: No    Alcohol/week: 0.0 standard drinks  . Drug use: No  . Sexual activity: Yes    Partners: Female    Birth control/protection: None  Lifestyle  . Physical activity:    Days per week: Not on file     Minutes per session: Not on file  . Stress: Not on file  Relationships  . Social connections:    Talks on phone: Not on file    Gets together: Not on file    Attends religious service: Not on file    Active member of club or organization: Not on file    Attends meetings of clubs or organizations: Not on file    Relationship status: Not on file  . Intimate partner violence:    Fear of current or ex partner: Not on file    Emotionally abused: Not on file    Physically abused: Not on file    Forced sexual activity: Not on file  Other Topics Concern  . Not on file  Social History Narrative   Lives with wife. Does not work.  On disability.    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 Provided crisis assistance program information to assist with bills       Has issues getting food- gets $36/month in food stamps- already has list of food pantries but has not tried them- encouraged pt to try food pantries and reach out to clinic for help if needed    Current Outpatient Medications on File Prior to Visit  Medication Sig Dispense Refill  . Accu-Chek FastClix Lancets MISC Use one strip to monitor glucose levels BID; E11.42 102 each 11  . albuterol (PROVENTIL HFA;VENTOLIN HFA) 108 (90 Base) MCG/ACT inhaler Inhale 2 puffs into the lungs every 6 (six) hours as needed for wheezing or shortness of breath. 1 Inhaler 0  . allopurinol (ZYLOPRIM) 100 MG tablet Take 100 mg by mouth daily.    [name] aspirin (EQ ASPIRIN ADULT LOW DOSE) 81 MG EC tablet Take 1 tablet (81 mg total) by mouth daily. Swallow whole. 90 tablet 3  . atorvastatin (LIPITOR) 40 MG tablet Take 1 tablet (40 mg total) by mouth daily. 90 tablet 3  . Blood Glucose Monitoring Suppl (ACCU-CHEK GUIDE) w/Device KIT 1 each by Does not apply route 2 (two) times a day.  Use to monitor glucose levels BID; E11.42 1 kit 0  . Blood Pressure KIT 1 each by Does not apply route 2 (two) times daily. 1 each 0  . carvedilol (COREG) 12.5 MG tablet Take 1 tablet (12.5 mg total) by mouth 2 (two) times daily with a meal. 180 tablet 0  . DULoxetine (CYMBALTA) 20 MG capsule Take 1 capsule by mouth once daily 30 capsule 2  . famotidine (PEPCID) 20 MG tablet Take 1 tablet (20 mg total) by mouth 2 (two) times  daily. 60 tablet 6  . furosemide (LASIX) 40 MG tablet Take 1 tablet (40 mg total) by mouth 2 (two) times daily. 180 tablet 3  . glucose blood (ACCU-CHEK GUIDE) test strip Use one strip to monitor glucose levels BID; E11.42 100 each 12  . hydrALAZINE (APRESOLINE) 10 MG tablet Take 1 tablet (10 mg total) by mouth every 8 (eight) hours. 90 tablet 1  . HYDROcodone-acetaminophen (NORCO) 7.5-325 MG tablet Take 1 tablet by mouth 3 (three) times daily.  0  . insulin NPH Human (HUMULIN N) 100 UNIT/ML injection Inject 1.5 mLs (150 Units total) into the skin every morning. And syringes 2/day 50 mL 11  . isosorbide mononitrate (IMDUR) 30 MG 24 hr tablet Take 1 tablet (30 mg total) by mouth daily. 90 tablet 3  . Potassium Chloride ER 20 MEQ TBCR Take 20 mEq by mouth daily. 90 tablet 3  . predniSONE (DELTASONE) 10 MG tablet Take 5 mg by mouth daily with breakfast.     . pregabalin (LYRICA) 100 MG capsule Take 1 capsule (100 mg total) by mouth 3 (three) times daily. 90 capsule 3  . sacubitril-valsartan (ENTRESTO) 97-103 MG Take 1 tablet by mouth 2 (two) times daily. 60 tablet 6  . Secukinumab (COSENTYX) 150 MG/ML SOSY Inject 150 mg into the skin every 28 (twenty-eight) days.     . Tafamidis (VYNDAMAX) 61 MG CAPS Take 61 mg by mouth daily. 30 capsule 11  . zolpidem (AMBIEN CR) 6.25 MG CR tablet Take 1 tablet (6.25 mg total) by mouth at bedtime as needed for sleep. Each prescription to last 1 month. 30 tablet 3   No current facility-administered medications on file prior to visit.      Allergies  Allergen Reactions  . Acetaminophen     Family History  Problem Relation Age of Onset  . Hypertension Mother   . Diabetes Mother   . COPD Mother   . Lung cancer Mother        Smoker   . Hypertension Father   . Diabetes Father   . Heart Problems Father   . COPD Father   . Colon cancer Neg Hx   . Stomach cancer Neg Hx     There were no vitals taken for this visit.   Review of Systems he denies hypoglycemia.      Objective:   Physical Exam   Lab Results  Component Value Date   CREATININE 1.59 (H) 08/31/2018   BUN 19 08/31/2018   NA 141 08/31/2018   K 3.7 08/31/2018   CL 101 08/31/2018   CO2 23 08/31/2018        Assessment & Plan:  Insulin-requiring type 2 DM, with DR: uncertain glycemic control.  He declines a1c today Renal failure: in this setting, he needs intermediate-release QD insulin. CAD: in this setting, he should avoid hypoglycemia  Patient Instructions  check your blood sugar twice a day.  vary the time of day when you check, between before the 3 meals, and at bedtime.  also check if you have symptoms of your blood sugar being too high or too low.  please keep a record of the readings and bring it to your next appointment here (or you can bring the meter itself).  You can write it on any piece of paper.  please call us sooner if your blood sugar goes below 70, or if you have a lot of readings over 200.   Please continue the same insulin.   On this type  of insulin schedule, you should eat meals on a regular schedule (especially lunch).  If a meal is missed or significantly delayed, your blood sugar could go low.   Please come back for a follow-up appointment in 2 months.

## 2019-01-04 ENCOUNTER — Other Ambulatory Visit: Payer: Medicaid Other

## 2019-01-04 ENCOUNTER — Other Ambulatory Visit: Payer: Self-pay | Admitting: Critical Care Medicine

## 2019-01-04 DIAGNOSIS — G4700 Insomnia, unspecified: Secondary | ICD-10-CM

## 2019-01-07 ENCOUNTER — Other Ambulatory Visit: Payer: Self-pay | Admitting: Internal Medicine

## 2019-01-07 DIAGNOSIS — G4700 Insomnia, unspecified: Secondary | ICD-10-CM

## 2019-01-07 NOTE — Telephone Encounter (Signed)
New Message   1) Medication(s) Requested (by name): Ambien   2) Pharmacy of Choice: [address]  3) Special Requests:   Approved medications will be sent to the pharmacy, we will reach out if there is an issue.  Requests made after 3pm may not be addressed until the following business day!  If a patient is unsure of the name of the medication(s) please note and ask patient to call back when they are able to provide all info, do not send to responsible party until all information is available!

## 2019-01-09 MED ORDER — ZOLPIDEM TARTRATE ER 6.25 MG PO TBCR: 6.2500 mg | EXTENDED_RELEASE_TABLET | Freq: Every evening | ORAL | 3 refills | Status: DC | PRN

## 2019-01-11 ENCOUNTER — Other Ambulatory Visit: Payer: Self-pay | Admitting: Pharmacist

## 2019-01-11 DIAGNOSIS — G4700 Insomnia, unspecified: Secondary | ICD-10-CM

## 2019-01-11 NOTE — Progress Notes (Unsigned)
Patient requesting Ambien refill. Will route to PCP for review.

## 2019-01-11 NOTE — Telephone Encounter (Signed)
Verbal given to Pharmacist Gildardo Cranker with read back.

## 2019-01-11 NOTE — Progress Notes (Signed)
Routed message to Joycelyn Schmid she will call in rx for me

## 2019-01-11 NOTE — Telephone Encounter (Signed)
Will you call in rx please

## 2019-01-26 ENCOUNTER — Telehealth (HOSPITAL_COMMUNITY): Payer: Self-pay | Admitting: Vascular Surgery

## 2019-01-26 NOTE — Telephone Encounter (Signed)
Left pt VM informing that 6/22 appt will be canceled w/ db , due Covid/social distancing, asked pt to call office to confirm he received message

## 2019-01-31 ENCOUNTER — Encounter (HOSPITAL_COMMUNITY): Payer: Medicaid Other | Admitting: Internal Medicine

## 2019-02-03 ENCOUNTER — Other Ambulatory Visit: Payer: Self-pay | Admitting: Internal Medicine

## 2019-02-03 DIAGNOSIS — I2510 Atherosclerotic heart disease of native coronary artery without angina pectoris: Secondary | ICD-10-CM

## 2019-02-17 ENCOUNTER — Other Ambulatory Visit: Payer: Self-pay

## 2019-02-21 ENCOUNTER — Ambulatory Visit: Payer: Medicaid Other | Admitting: Endocrinology

## 2019-02-22 ENCOUNTER — Other Ambulatory Visit: Payer: Self-pay

## 2019-02-22 ENCOUNTER — Encounter: Payer: Self-pay | Admitting: Endocrinology

## 2019-02-22 ENCOUNTER — Ambulatory Visit: Payer: Medicaid Other | Admitting: Endocrinology

## 2019-02-22 VITALS — BP 108/60 | HR 71 | Ht 66.0 in | Wt 244.2 lb

## 2019-02-22 DIAGNOSIS — E1122 Type 2 diabetes mellitus with diabetic chronic kidney disease: Secondary | ICD-10-CM

## 2019-02-22 DIAGNOSIS — IMO0002 Reserved for concepts with insufficient information to code with codable children: Secondary | ICD-10-CM

## 2019-02-22 DIAGNOSIS — N183 Chronic kidney disease, stage 3 unspecified: Secondary | ICD-10-CM

## 2019-02-22 DIAGNOSIS — E118 Type 2 diabetes mellitus with unspecified complications: Secondary | ICD-10-CM | POA: Diagnosis not present

## 2019-02-22 DIAGNOSIS — E1165 Type 2 diabetes mellitus with hyperglycemia: Secondary | ICD-10-CM

## 2019-02-22 DIAGNOSIS — Z794 Long term (current) use of insulin: Secondary | ICD-10-CM | POA: Diagnosis not present

## 2019-02-22 LAB — POCT GLYCOSYLATED HEMOGLOBIN (HGB A1C): Hemoglobin A1C: 8.9 % — AB (ref 4.0–5.6)

## 2019-02-22 MED ORDER — NOVOLIN 70/30 (70-30) 100 UNIT/ML ~~LOC~~ SUSP: 160.0000 [IU] | Freq: Every day | SUBCUTANEOUS | 11 refills | Status: DC

## 2019-02-22 NOTE — Progress Notes (Signed)
° °Subjective:  ° ° Patient ID: Marco [name], male    DOB: 10/12/1955, 63 y.o.   MRN: [id number] ° °HPI °Pt returns for f/u of diabetes mellitus: °DM type: Insulin-requiring type 2 °Dx'ed: 2009 °Complications: polyneuropathy, CAD, NPDR, and renal failure.  °Therapy: insulin since soon after dx °DKA: never °Severe hypoglycemia: never.  °Pancreatitis: never.  °Pancreatic imaging: never.  °Other: he chronically takes prednisone, for psoriasis, with no plan for taper; he declined to continue multiple daily injections.   °Interval history: no cbg record, but states cbg's vary from 70-160 fasting.  He does not check later in the day, but he says he never misses the insulin.  pt states he feels well in general.  No change in chronic prednisone dosage (5 mg qd).  He takes 160 units qam.  °Past Medical History:  °Diagnosis Date  °• Benign colon polyp 08/01/2013  ° [facility] in [place]. large base tranverse colon polyp was biopsied. polyp was benign with minimal surface hyperplastic change.  °• Chronic combined systolic and diastolic CHF (congestive heart failure) (HCC)   °• Chronic pain   °• CKD (chronic kidney disease), stage III (HCC)   °• Diabetes mellitus without complication (HCC)   °• Diverticulosis   °• Esophageal hiatal hernia 07/29/2013  ° confirmed on EGD   °• Esophageal stricture   °• Essential hypertension   °• Gastritis 07/29/2013  ° confirmed on EGD, bx done an negative for intestinal metaplasia, dsyplasia or H. pylori. normal gastric emptying study done 07/13/2013.  °• GERD (gastroesophageal reflux disease)   °• Morbid obesity (HCC)   °• Non-obstructive CAD   ° a. 02/2013 Cath ([place]): nonobs dzs;  b. 09/2014 Myoview ([place]): EF 55%, no ischemia;  c. Cath 05/2018 mild CAD no obstruction.  °• Persistent atrial fibrillation   ° a. 02/2013 s/p rfca in [place], [place]-->prev on Xarelto, d/c'd 2/2 anemia, ? GIB.  °• Rheumatoid arthritis (HCC)   °• Sigmoid diverticulosis 08/01/2013  ° confirmed on colonscopy.  record scanned into chart  ° ° °Past Surgical History:  °Procedure Laterality Date  °• CARDIAC CATHETERIZATION  05/2014  ° ablation for atrial fibrillation  °• CARDIAC CATHETERIZATION N/A 11/16/2015  ° Procedure: Left Heart Cath and Coronary Angiography;  Surgeon: David W Harding, MD;  Location: [facility];  Service: Cardiovascular;  Laterality: N/A;  °• COLON RESECTION  09/2013  ° due to large, abnormal polpy. non cancerous per patient.   °• COLON SURGERY  09/2014  ° colon resection   °• LEFT HEART CATH AND CORONARY ANGIOGRAPHY N/A 05/19/2018  ° Procedure: LEFT HEART CATH AND CORONARY ANGIOGRAPHY;  Surgeon: Arida, Muhammad A, MD;  Location: [facility];  Service: Cardiovascular;  Laterality: N/A;  ° ° °Social History  ° °Socioeconomic History  °• Marital status: Married  °  Spouse name: Amethyst  °• Number of children: 8  °• Years of education: 13  °• Highest education level: Some college, no degree  °Occupational History  °  Comment: disabled  °Social Needs  °• Financial resource strain: Hard  °• Food insecurity  °  Worry: Often true  °  Inability: Often true  °• Transportation needs  °  Medical: Yes  °  Non-medical: Yes  °Tobacco Use  °• Smoking status: Former Smoker  °  Packs/day: 0.50  °  Years: 10.00  °  Pack years: 5.00  °  Types: Cigarettes  °  Quit date: 04/11/2008  °  Years since quitting:   quitting: 10.8   Smokeless tobacco: Never Used  Substance and Sexual Activity   Alcohol use: No    Alcohol/week: 0.0 standard drinks   Drug use: No   Sexual activity: Yes    Partners: Female    Birth control/protection: None  Lifestyle   Physical activity    Days per week: Not on file    Minutes per session: Not on file   Stress: Not on file  Relationships   Social connections    Talks on phone: Not on file    Gets together: Not on file    Attends religious service: Not on file    Active member of club or organization: Not on file    Attends meetings of clubs or organizations: Not on file     Relationship status: Not on file   Intimate partner violence    Fear of current or ex partner: Not on file    Emotionally abused: Not on file    Physically abused: Not on file    Forced sexual activity: Not on file  Other Topics Concern   Not on file  Social History Narrative   Lives with wife. Does not work.  On disability.    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 Provided crisis assistance program information to assist with bills       Has issues getting food- gets $36/month in food stamps- already has list of food pantries but has not tried them- encouraged pt to try food pantries and reach out to clinic for help if needed    Current Outpatient Medications on File Prior to Visit  Medication Sig Dispense Refill   Accu-Chek FastClix Lancets MISC Use one strip to monitor glucose levels BID; E11.42 102 each 11   albuterol (PROVENTIL HFA;VENTOLIN HFA) 108 (90 Base) MCG/ACT inhaler Inhale 2 puffs into the lungs every 6 (six) hours as needed for wheezing or shortness of breath. 1 Inhaler 0   allopurinol (ZYLOPRIM) 100 MG tablet Take 100 mg by mouth daily.     atorvastatin (LIPITOR) 40 MG tablet Take 1 tablet (40 mg total) by mouth daily. 90 tablet 3   Blood Glucose Monitoring Suppl (ACCU-CHEK GUIDE) w/Device KIT 1 each by Does not apply route 2 (two) times a day. Use to monitor glucose levels BID; E11.42 1 kit 0   Blood Pressure KIT 1 each by Does not apply route 2 (two) times daily. 1 each 0   carvedilol (COREG) 12.5 MG tablet Take 1 tablet (12.5 mg total) by mouth 2 (two) times daily with a meal. 180 tablet 0   DULoxetine (CYMBALTA) 20 MG capsule Take 1 capsule by mouth once daily 30 capsule 2   EQ ASPIRIN ADULT LOW DOSE 81 MG EC tablet TAKE 1 TABLET BY MOUTH ONCE DAILY. SWALLOW WHOLE 90 tablet 0    famotidine (PEPCID) 20 MG tablet Take 1 tablet (20 mg total) by mouth 2 (two) times daily. 60 tablet 6   furosemide (LASIX) 40 MG tablet Take 1 tablet (40 mg total) by mouth 2 (two) times daily. 180 tablet 3   glucose blood (ACCU-CHEK GUIDE) test strip Use one strip to monitor glucose levels BID; E11.42 100 each 12   hydrALAZINE (APRESOLINE) 10 MG tablet Take 1 tablet (10 mg total) by mouth every  8 (eight) hours. 90 tablet 1   HYDROcodone-acetaminophen (NORCO) 7.5-325 MG tablet Take 1 tablet by mouth 3 (three) times daily.  0   isosorbide mononitrate (IMDUR) 30 MG 24 hr tablet Take 1 tablet (30 mg total) by mouth daily. 90 tablet 3   Potassium Chloride ER 20 MEQ TBCR Take 20 mEq by mouth daily. 90 tablet 3   predniSONE (DELTASONE) 10 MG tablet Take 5 mg by mouth daily with breakfast.      pregabalin (LYRICA) 100 MG capsule Take 1 capsule (100 mg total) by mouth 3 (three) times daily. 90 capsule 3   sacubitril-valsartan (ENTRESTO) 97-103 MG Take 1 tablet by mouth 2 (two) times daily. 60 tablet 6   Secukinumab (COSENTYX) 150 MG/ML SOSY Inject 150 mg into the skin every 28 (twenty-eight) days.      Tafamidis (VYNDAMAX) 61 MG CAPS Take 61 mg by mouth daily. 30 capsule 11   zolpidem (AMBIEN CR) 6.25 MG CR tablet Take 1 tablet (6.25 mg total) by mouth at bedtime as needed for sleep. Each prescription to last 1 month. 30 tablet 3   No current facility-administered medications on file prior to visit.     Allergies  Allergen Reactions   Acetaminophen     Family History  Problem Relation Age of Onset   Hypertension Mother    Diabetes Mother    COPD Mother    Lung cancer Mother        Smoker    Hypertension Father    Diabetes Father    Heart Problems Father    COPD Father    Colon cancer Neg Hx    Stomach cancer Neg Hx     BP 108/60 (BP Location: Left Arm, Patient Position: Sitting, Cuff Size: Large)    Pulse 71    Ht 5' 6" (1.676 m)    Wt 244 lb 3.2 oz (110.8 kg)     SpO2 90%    BMI 39.41 kg/m    Review of Systems He denies hypoglycemia    Objective:   Physical Exam VITAL SIGNS:  See vs page GENERAL: no distress Pulses: dorsalis pedis intact bilat.   MSK: no deformity of the feet CV: 1+ bilat leg edema Skin:  no ulcer on the feet.  normal color and temp on the feet. Neuro: sensation is intact to touch on the feet, but decreased from normal.   Ext: There is bilateral onychomycosis of the toenails, which are long.   Lab Results  Component Value Date   HGBA1C 8.9 (A) 02/22/2019   Lab Results  Component Value Date   CREATININE 1.59 (H) 08/31/2018   BUN 19 08/31/2018   NA 141 08/31/2018   K 3.7 08/31/2018   CL 101 08/31/2018   CO2 23 08/31/2018       Assessment & Plan:  Insulin-requiring type 2 DM, with renal failure: worse.   Noncompliance with cbg recording: he is not a candidate for multiple daily injections Hypoglycemia: this limits aggressiveness of glycemic control.  Also, he needs a faster-acting qd insulin.   Patient Instructions  check your blood sugar twice a day.  vary the time of day when you check, between before the 3 meals, and at bedtime.  also check if you have symptoms of your blood sugar being too high or too low.  please keep a record of the readings and bring it to your next appointment here (or you can bring the meter itself).  You can write it on any piece  paper.  please call us sooner if your blood sugar goes below 70, or if you have a lot of readings over 200.   °I have sent a prescription to your pharmacy, to change the insulin to "70/30."  You should take this with breakfast.   °On this type of insulin schedule, you should eat meals on a regular schedule (especially lunch).  If a meal is missed or significantly delayed, your blood sugar could go low.   °Please come back for a follow-up appointment in 2 months.   ° ° °

## 2019-02-22 NOTE — Progress Notes (Signed)
ae

## 2019-02-22 NOTE — Patient Instructions (Addendum)
check your blood sugar twice a day.  vary the time of day when you check, between before the 3 meals, and at bedtime.  also check if you have symptoms of your blood sugar being too high or too low.  please keep a record of the readings and bring it to your next appointment here (or you can bring the meter itself).  You can write it on any piece of paper.  please call us sooner if your blood sugar goes below 70, or if you have a lot of readings over 200.   I have sent a prescription to your pharmacy, to change the insulin to "70/30."  You should take this with breakfast.   On this type of insulin schedule, you should eat meals on a regular schedule (especially lunch).  If a meal is missed or significantly delayed, your blood sugar could go low.   Please come back for a follow-up appointment in 2 months.

## 2019-02-28 ENCOUNTER — Telehealth: Payer: Self-pay | Admitting: Endocrinology

## 2019-02-28 ENCOUNTER — Other Ambulatory Visit: Payer: Self-pay | Admitting: Endocrinology

## 2019-02-28 MED ORDER — PEN NEEDLES 31G X 6 MM MISC: 2.0000 | Freq: Every day | 3 refills | Status: DC

## 2019-02-28 MED ORDER — INSULIN LISPRO PROT & LISPRO (75-25 MIX) 100 UNIT/ML KWIKPEN: 160.0000 [IU] | PEN_INJECTOR | Freq: Every day | SUBCUTANEOUS | 11 refills | Status: DC

## 2019-02-28 NOTE — Telephone Encounter (Signed)
Confirmed with the patient that Dr.Ellison fixed his RX refill.

## 2019-02-28 NOTE — Telephone Encounter (Signed)
Ok, I have sent a prescription to your pharmacy, for a different rx

## 2019-03-18 ENCOUNTER — Telehealth: Payer: Self-pay

## 2019-03-18 NOTE — Telephone Encounter (Signed)
Zolpidem PA approved.

## 2019-03-24 ENCOUNTER — Other Ambulatory Visit: Payer: Self-pay

## 2019-03-24 ENCOUNTER — Ambulatory Visit: Payer: Medicaid Other | Attending: Internal Medicine | Admitting: Physician Assistant

## 2019-03-24 NOTE — Progress Notes (Deleted)
Virtual Visit via Telephone Note  I connected with Marco [name] on 03/24/19 at  3:50 PM EDT by telephone and verified that I am speaking with the correct person using two identifiers.   I discussed the limitations, risks, security and privacy concerns of performing an evaluation and management service by telephone and the availability of in person appointments. I also discussed with the patient that there may be a patient responsible charge related to this service. The patient expressed understanding and agreed to proceed.  Patient location: My Location:  [place] office Persons on the call:    History of Present Illness:    Observations/Objective:   Assessment and Plan:   Follow Up Instructions:    I discussed the assessment and treatment plan with the patient. The patient was provided an opportunity to ask questions and all were answered. The patient agreed with the plan and demonstrated an understanding of the instructions.   The patient was advised to call back or seek an in-person evaluation if the symptoms worsen or if the condition fails to improve as anticipated.  I provided *** minutes of non-face-to-face time during this encounter.   Freeman Caldron, [name]  Patient ID: Marco [name], male   DOB: 07-16-1956, 63 y.o.   MRN: [id number]

## 2019-04-02 ENCOUNTER — Other Ambulatory Visit: Payer: Self-pay | Admitting: Internal Medicine

## 2019-04-14 ENCOUNTER — Other Ambulatory Visit: Payer: Self-pay | Admitting: Cardiology

## 2019-04-21 ENCOUNTER — Other Ambulatory Visit: Payer: Self-pay

## 2019-04-21 ENCOUNTER — Ambulatory Visit: Payer: Medicaid Other | Attending: Internal Medicine | Admitting: Internal Medicine

## 2019-04-21 DIAGNOSIS — Z2821 Immunization not carried out because of patient refusal: Secondary | ICD-10-CM | POA: Diagnosis not present

## 2019-04-21 DIAGNOSIS — E1140 Type 2 diabetes mellitus with diabetic neuropathy, unspecified: Secondary | ICD-10-CM | POA: Diagnosis not present

## 2019-04-21 DIAGNOSIS — M5442 Lumbago with sciatica, left side: Secondary | ICD-10-CM | POA: Diagnosis not present

## 2019-04-21 DIAGNOSIS — E1165 Type 2 diabetes mellitus with hyperglycemia: Secondary | ICD-10-CM

## 2019-04-21 DIAGNOSIS — G253 Myoclonus: Secondary | ICD-10-CM

## 2019-04-21 DIAGNOSIS — G8929 Other chronic pain: Secondary | ICD-10-CM

## 2019-04-21 DIAGNOSIS — L4050 Arthropathic psoriasis, unspecified: Secondary | ICD-10-CM

## 2019-04-21 DIAGNOSIS — IMO0002 Reserved for concepts with insufficient information to code with codable children: Secondary | ICD-10-CM

## 2019-04-21 NOTE — Progress Notes (Signed)
Virtual Visit via Telephone Note Due to current restrictions/limitations of in-office visits due to the COVID-19 pandemic, this scheduled clinical appointment was converted to a telehealth visit Pt was offered in-person visit prior. I connected with Marco [name] on 04/21/19 at 12:06 p.m by telephone and verified that I am speaking with the correct person using two identifiers. I am in my office.  The patient is at home.  Only the patient and myself participated in this encounter.  I discussed the limitations, risks, security and privacy concerns of performing an evaluation and management service by telephone and the availability of in person appointments. I also discussed with the patient that there may be a patient responsible charge related to this service. The patient expressed understanding and agreed to proceed.   History of Present Illness: history of HTN,DM2 with neuropathy, retinopathyand nephropathy, CKD stage III, obesity, nonobstructive CAD/cardiac amyloid, chronic combined CHF, OSA on VPAP, followed by rheumatology in [facility] for psoriasis/gout/nonspec arthritis, chronic insomnia. On narcotics through [facility] for chronic pain inchest/back/shoulders/neck.  Last evaluated May/2020.  Purpose of today's visit is chronic disease management.  Chronic LBP with LT sided sciatica: On last visit he complained of worsening low back pain radiating to the left leg.  I had recommended that he get the x-ray done of the lumbar spine and then we would move on from there. -Did not get x-ray done because he was afraid to go to the hospital during the COVID crisis. -Did see his pain specialist and was given tizanidine which he took for about 2 months and found helpful.  He stopped taking it a few days ago because it was making him feel off balance when he walks and sedated. -He tells me that the back pain is still there  Complains of intermittent twitching and myoclonic jerks of the extremities  x1 month.  He had similar episode last year that required hospitalization and he was found to have acute on chronic renal insufficiency at the time.  CKD 3 -he has not seen his kidney specialist in a while.  He states that they did call him recently and left a message to have him schedule an appointment but he has not called him back as yet.  He plans to do so.  DM type II: Still followed by endocrinologist [name].  NPH insulin recently changed to Humalog 75/25.  He reports his blood sugars have been better.  Checks only once a day in the mornings before breakfast.  Gives range of 90-120.  Blood sugar this morning was 120.  Doing better with his eating habits.  Eating mainly broiled or baked foods.  Psoriasis/gout/nonspecific arthritis: Followed by rheumatology Dr. Hermelinda Medicus.  Last seen about a week ago.  No change in medications.  Patient is on Cosentyx.        Outpatient Encounter Medications as of 04/21/2019  Medication Sig  . Accu-Chek FastClix Lancets MISC Use one strip to monitor glucose levels BID; E11.42  . albuterol (PROVENTIL HFA;VENTOLIN HFA) 108 (90 Base) MCG/ACT inhaler Inhale 2 puffs into the lungs every 6 (six) hours as needed for wheezing or shortness of breath.  . allopurinol (ZYLOPRIM) 100 MG tablet Take 100 mg by mouth daily.  [name] atorvastatin (LIPITOR) 40 MG tablet Take 1 tablet (40 mg total) by mouth daily.  . Blood Glucose Monitoring Suppl (ACCU-CHEK GUIDE) w/Device KIT 1 each by Does not apply route 2 (two) times a day. Use to monitor glucose levels BID; E11.42  . Blood Pressure  KIT 1 each by Does not apply route 2 (two) times daily.  . carvedilol (COREG) 12.5 MG tablet Take 1 tablet (12.5 mg total) by mouth 2 (two) times daily with a meal.  . DULoxetine (CYMBALTA) 20 MG capsule Take 1 capsule by mouth once daily  . EQ ASPIRIN ADULT LOW DOSE 81 MG EC tablet TAKE 1 TABLET BY MOUTH ONCE DAILY. SWALLOW WHOLE  . famotidine (PEPCID) 20 MG tablet Take 1 tablet (20  mg total) by mouth 2 (two) times daily.  . furosemide (LASIX) 40 MG tablet Take 1 tablet (40 mg total) by mouth 2 (two) times daily.  [name] glucose blood (ACCU-CHEK GUIDE) test strip Use one strip to monitor glucose levels BID; E11.42  . hydrALAZINE (APRESOLINE) 10 MG tablet Take 1 tablet (10 mg total) by mouth every 8 (eight) hours.  [name] HYDROcodone-acetaminophen (NORCO) 7.5-325 MG tablet Take 1 tablet by mouth 3 (three) times daily.  . Insulin Lispro Prot & Lispro (HUMALOG MIX 75/25 KWIKPEN) (75-25) 100 UNIT/ML Kwikpen Inject 160 Units into the skin daily with breakfast.  . Insulin Pen Needle (PEN NEEDLES) 31G X 6 MM MISC 2 Devices by Does not apply route daily.  . isosorbide mononitrate (IMDUR) 30 MG 24 hr tablet Take 1 tablet by mouth once daily  . Potassium Chloride ER 20 MEQ TBCR Take 20 mEq by mouth daily.  . predniSONE (DELTASONE) 10 MG tablet Take 5 mg by mouth daily with breakfast.   . pregabalin (LYRICA) 100 MG capsule Take 1 capsule (100 mg total) by mouth 3 (three) times daily.  . sacubitril-valsartan (ENTRESTO) 97-103 MG Take 1 tablet by mouth 2 (two) times daily.  . Secukinumab (COSENTYX) 150 MG/ML SOSY Inject 150 mg into the skin every 28 (twenty-eight) days.   . Tafamidis (VYNDAMAX) 61 MG CAPS Take 61 mg by mouth daily.  [name] zolpidem (AMBIEN CR) 6.25 MG CR tablet Take 1 tablet (6.25 mg total) by mouth at bedtime as needed for sleep. Each prescription to last 1 month.   No facility-administered encounter medications on file as of 04/21/2019.     Observations/Objective: No direct observation done as this was a telephone encounter.  Lab Results  Component Value Date   HGBA1C 8.9 (A) 02/22/2019    Assessment and Plan: 1. Myoclonic jerking Advised patient to come to the lab today to have blood test done in particular to check his kidney function and electrolytes.  Further management will be based on results.  Advised patient that I will be on vacation next week but I could set him up  to see 1 of our other providers in the meantime.  Patient declined stating he would prefer to wait until I return. -Discontinue tizanidine - Comprehensive metabolic panel; Future  2. Chronic low back pain with left-sided sciatica, unspecified back pain laterality Advised patient to have the x-ray done as previously ordered.  Once we have that we can move further with getting MRI and referral to spine specialist based on results.  3. Uncontrolled type 2 diabetes mellitus with diabetic neuropathy ([place]) Followed by endocrinology.  Commended him on changes in his eating habits.  He will continue Humalog 75/25 as prescribed - Comprehensive metabolic panel; Future - Lipid panel; Future - CBC; Future  4. Influenza vaccination declined   5. 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[place]) Followed by rheumatology   Follow Up Instructions: 2 wks   I discussed the assessment and treatment plan with the patient. The patient was provided an opportunity to ask questions and all were  answered. The patient agreed with the plan and demonstrated an understanding of the instructions.   The patient was advised to call back or seek an in-person evaluation if the symptoms worsen or if the condition fails to improve as anticipated.  I provided 20 minutes of non-face-to-face time during this encounter.   Karle Plumber, MD

## 2019-04-22 ENCOUNTER — Ambulatory Visit: Payer: Medicaid Other | Attending: Family Medicine

## 2019-04-22 ENCOUNTER — Other Ambulatory Visit: Payer: Self-pay

## 2019-04-22 DIAGNOSIS — E1140 Type 2 diabetes mellitus with diabetic neuropathy, unspecified: Secondary | ICD-10-CM

## 2019-04-22 DIAGNOSIS — IMO0002 Reserved for concepts with insufficient information to code with codable children: Secondary | ICD-10-CM

## 2019-04-22 DIAGNOSIS — G253 Myoclonus: Secondary | ICD-10-CM

## 2019-04-23 LAB — COMPREHENSIVE METABOLIC PANEL
ALT: 10 IU/L (ref 0–44)
AST: 7 IU/L (ref 0–40)
Albumin/Globulin Ratio: 1.2 (ref 1.2–2.2)
Albumin: 3.8 g/dL (ref 3.8–4.8)
Alkaline Phosphatase: 52 IU/L (ref 39–117)
BUN/Creatinine Ratio: 11 (ref 10–24)
BUN: 20 mg/dL (ref 8–27)
Bilirubin Total: 0.4 mg/dL (ref 0.0–1.2)
CO2: 27 mmol/L (ref 20–29)
Calcium: 8.5 mg/dL — ABNORMAL LOW (ref 8.6–10.2)
Chloride: 104 mmol/L (ref 96–106)
Creatinine, Ser: 1.83 mg/dL — ABNORMAL HIGH (ref 0.76–1.27)
GFR calc Af Amer: 44 mL/min/{1.73_m2} — ABNORMAL LOW (ref >59)
GFR calc non Af Amer: 38 mL/min/{1.73_m2} — ABNORMAL LOW (ref >59)
Globulin, Total: 3.1 g/dL (ref 1.5–4.5)
Glucose: 54 mg/dL — ABNORMAL LOW (ref 65–99)
Potassium: 3.6 mmol/L (ref 3.5–5.2)
Sodium: 146 mmol/L — ABNORMAL HIGH (ref 134–144)
Total Protein: 6.9 g/dL (ref 6.0–8.5)

## 2019-04-23 LAB — CBC
Hematocrit: 41.1 % (ref 37.5–51.0)
Hemoglobin: 13.2 g/dL (ref 13.0–17.7)
MCH: 29.7 pg (ref 26.6–33.0)
MCHC: 32.1 g/dL (ref 31.5–35.7)
MCV: 92 fL (ref 79–97)
Platelets: 199 10*3/uL (ref 150–450)
RBC: 4.45 x10E6/uL (ref 4.14–5.80)
RDW: 14 % (ref 11.6–15.4)
WBC: 6.9 10*3/uL (ref 3.4–10.8)

## 2019-04-23 LAB — LIPID PANEL
Chol/HDL Ratio: 3.9 ratio (ref 0.0–5.0)
Cholesterol, Total: 126 mg/dL (ref 100–199)
HDL: 32 mg/dL — ABNORMAL LOW (ref >39)
LDL Chol Calc (NIH): 70 mg/dL (ref 0–99)
Triglycerides: 134 mg/dL (ref 0–149)
VLDL Cholesterol Cal: 24 mg/dL (ref 5–40)

## 2019-04-28 ENCOUNTER — Ambulatory Visit: Payer: Medicaid Other | Admitting: Endocrinology

## 2019-04-29 ENCOUNTER — Telehealth: Payer: Self-pay | Admitting: Internal Medicine

## 2019-04-29 NOTE — Telephone Encounter (Signed)
scheduled

## 2019-04-29 NOTE — Telephone Encounter (Signed)
-----   Message from Jackelyn Knife, [place] sent at 04/21/2019  2:34 PM EDT ----- Per Dr. Wynetta Emery please contact pt and schedule him an in person visit with her when she returns from vacation for  jerking in extremities

## 2019-05-02 ENCOUNTER — Ambulatory Visit: Payer: Medicaid Other | Attending: Internal Medicine | Admitting: Internal Medicine

## 2019-05-02 ENCOUNTER — Other Ambulatory Visit: Payer: Self-pay

## 2019-05-02 ENCOUNTER — Encounter: Payer: Self-pay | Admitting: Internal Medicine

## 2019-05-02 VITALS — BP 124/73 | HR 63 | Temp 98.1°F | Resp 16 | Wt 246.0 lb

## 2019-05-02 DIAGNOSIS — Z79899 Other long term (current) drug therapy: Secondary | ICD-10-CM | POA: Diagnosis not present

## 2019-05-02 DIAGNOSIS — E1122 Type 2 diabetes mellitus with diabetic chronic kidney disease: Secondary | ICD-10-CM | POA: Insufficient documentation

## 2019-05-02 DIAGNOSIS — G253 Myoclonus: Secondary | ICD-10-CM | POA: Insufficient documentation

## 2019-05-02 DIAGNOSIS — Z7982 Long term (current) use of aspirin: Secondary | ICD-10-CM | POA: Diagnosis not present

## 2019-05-02 DIAGNOSIS — I5042 Chronic combined systolic (congestive) and diastolic (congestive) heart failure: Secondary | ICD-10-CM | POA: Insufficient documentation

## 2019-05-02 DIAGNOSIS — N183 Chronic kidney disease, stage 3 unspecified: Secondary | ICD-10-CM

## 2019-05-02 DIAGNOSIS — Z7952 Long term (current) use of systemic steroids: Secondary | ICD-10-CM | POA: Insufficient documentation

## 2019-05-02 DIAGNOSIS — Z794 Long term (current) use of insulin: Secondary | ICD-10-CM | POA: Insufficient documentation

## 2019-05-02 DIAGNOSIS — I2510 Atherosclerotic heart disease of native coronary artery without angina pectoris: Secondary | ICD-10-CM | POA: Insufficient documentation

## 2019-05-02 DIAGNOSIS — E1165 Type 2 diabetes mellitus with hyperglycemia: Secondary | ICD-10-CM | POA: Insufficient documentation

## 2019-05-02 DIAGNOSIS — I428 Other cardiomyopathies: Secondary | ICD-10-CM | POA: Insufficient documentation

## 2019-05-02 DIAGNOSIS — G8929 Other chronic pain: Secondary | ICD-10-CM | POA: Diagnosis not present

## 2019-05-02 DIAGNOSIS — Z6839 Body mass index (BMI) 39.0-39.9, adult: Secondary | ICD-10-CM | POA: Diagnosis not present

## 2019-05-02 DIAGNOSIS — IMO0002 Reserved for concepts with insufficient information to code with codable children: Secondary | ICD-10-CM

## 2019-05-02 DIAGNOSIS — G4733 Obstructive sleep apnea (adult) (pediatric): Secondary | ICD-10-CM | POA: Insufficient documentation

## 2019-05-02 DIAGNOSIS — I4891 Unspecified atrial fibrillation: Secondary | ICD-10-CM | POA: Insufficient documentation

## 2019-05-02 DIAGNOSIS — Z886 Allergy status to analgesic agent status: Secondary | ICD-10-CM | POA: Insufficient documentation

## 2019-05-02 DIAGNOSIS — F329 Major depressive disorder, single episode, unspecified: Secondary | ICD-10-CM | POA: Insufficient documentation

## 2019-05-02 DIAGNOSIS — E1140 Type 2 diabetes mellitus with diabetic neuropathy, unspecified: Secondary | ICD-10-CM | POA: Diagnosis not present

## 2019-05-02 DIAGNOSIS — E1142 Type 2 diabetes mellitus with diabetic polyneuropathy: Secondary | ICD-10-CM | POA: Insufficient documentation

## 2019-05-02 DIAGNOSIS — Z79891 Long term (current) use of opiate analgesic: Secondary | ICD-10-CM | POA: Insufficient documentation

## 2019-05-02 DIAGNOSIS — I130 Hypertensive heart and chronic kidney disease with heart failure and stage 1 through stage 4 chronic kidney disease, or unspecified chronic kidney disease: Secondary | ICD-10-CM | POA: Insufficient documentation

## 2019-05-02 DIAGNOSIS — E876 Hypokalemia: Secondary | ICD-10-CM | POA: Diagnosis not present

## 2019-05-02 DIAGNOSIS — M109 Gout, unspecified: Secondary | ICD-10-CM | POA: Diagnosis not present

## 2019-05-02 DIAGNOSIS — Z87891 Personal history of nicotine dependence: Secondary | ICD-10-CM | POA: Insufficient documentation

## 2019-05-02 DIAGNOSIS — K219 Gastro-esophageal reflux disease without esophagitis: Secondary | ICD-10-CM | POA: Insufficient documentation

## 2019-05-02 DIAGNOSIS — M62838 Other muscle spasm: Secondary | ICD-10-CM | POA: Diagnosis not present

## 2019-05-02 DIAGNOSIS — E11319 Type 2 diabetes mellitus with unspecified diabetic retinopathy without macular edema: Secondary | ICD-10-CM | POA: Diagnosis not present

## 2019-05-02 DIAGNOSIS — Z8249 Family history of ischemic heart disease and other diseases of the circulatory system: Secondary | ICD-10-CM | POA: Insufficient documentation

## 2019-05-02 DIAGNOSIS — E785 Hyperlipidemia, unspecified: Secondary | ICD-10-CM | POA: Insufficient documentation

## 2019-05-02 DIAGNOSIS — Z833 Family history of diabetes mellitus: Secondary | ICD-10-CM | POA: Insufficient documentation

## 2019-05-02 DIAGNOSIS — Z801 Family history of malignant neoplasm of trachea, bronchus and lung: Secondary | ICD-10-CM | POA: Insufficient documentation

## 2019-05-02 LAB — POCT GLYCOSYLATED HEMOGLOBIN (HGB A1C): HbA1c, POC (controlled diabetic range): 7.2 % — AB (ref 0.0–7.0)

## 2019-05-02 LAB — GLUCOSE, POCT (MANUAL RESULT ENTRY): POC Glucose: 110 mg/dl — AB (ref 70–99)

## 2019-05-02 NOTE — Progress Notes (Signed)
States jerking has improved.

## 2019-05-02 NOTE — Progress Notes (Signed)
Patient ID: Marco [name], male    DOB: 1956/03/14  MRN: [id number]  CC: Follow-up   Subjective: Marco [name] is a 63 y.o. male who presents for UC for follow-up on myoclonic jerks in the extremities. His concerns today include: history of HTN,DM2 with neuropathy, retinopathyand nephropathy, CKD stage III, obesity, nonobstructive CAD/cardiac amyloid, chronic combined CHF, OSA on VPAP, followed by rheumatology in [facility] for psoriasis/gout/nonspec arthritis, chronic insomnia. On narcotics through [facility] for chronic pain inchest/back/shoulders/neck.   Jerking movement has stopped.  He thinks it was either due to the tizanidine or Mucinex which he was also taking at the time. Lab test revealed that his kidney function was still not 100% and eGFR had slightly worsened.  Sodium level was 146.  I sent him his results via my chart advising that he drinks a few glasses of water a day.  He has been doing that. -He tried calling the nephrologist to get an appointment for follow-up but has not gotten an answer as yet.  We did fax results of these lab test to his nephrologist.    Patient Active Problem List   Diagnosis Date Noted  . Gastroesophageal reflux disease without esophagitis 08/31/2018  . Muscle spasm 08/31/2018  . NICM (nonischemic cardiomyopathy) ([place]) 07/06/2018  . Hypokalemia   . Hyperlipidemia 12/23/2016  . Chronic combined systolic and diastolic CHF (congestive heart failure) ([place]) 09/09/2016  . Diabetic retinopathy ([place]) 07/22/2016  . Diabetic polyneuropathy associated with type 2 diabetes mellitus ([place]) 06/10/2016  . Vision changes 06/02/2016  . Myalgia and myositis 03/31/2016  . Shortness of breath 02/09/2016  . Chronic gouty arthritis 11/29/2015  . LBP (low back pain) 11/27/2015  . Arthralgia of multiple joints 11/27/2015  . 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[place]) 11/27/2015  . Breast pain in male 11/14/2015  . Sinusitis, chronic 10/16/2015  . Insomnia 10/16/2015  .  New onset of headaches after age 84 09/20/2015  . OSA (obstructive sleep apnea) 07/12/2015  . Low serum testosterone level 05/18/2015  . Depression 05/16/2015  . Morbid obesity due to excess calories ([place])   . Chronic pain   . Diabetes ([place])   . CAD (coronary artery disease) 12/24/2014  . Atrial fibrillation ([place]) 12/24/2014  . Essential hypertension 12/24/2014  . Chronic kidney disease 12/24/2014  . PUD (peptic ulcer disease) 12/24/2014     Current Outpatient Medications on File Prior to Visit  Medication Sig Dispense Refill  . albuterol (PROVENTIL HFA;VENTOLIN HFA) 108 (90 Base) MCG/ACT inhaler Inhale 2 puffs into the lungs every 6 (six) hours as needed for wheezing or shortness of breath. 1 Inhaler 0  . allopurinol (ZYLOPRIM) 100 MG tablet Take 100 mg by mouth daily.    [name] atorvastatin (LIPITOR) 40 MG tablet Take 1 tablet (40 mg total) by mouth daily. 90 tablet 3  . carvedilol (COREG) 12.5 MG tablet Take 1 tablet (12.5 mg total) by mouth 2 (two) times daily with a meal. 180 tablet 0  . DULoxetine (CYMBALTA) 20 MG capsule Take 1 capsule by mouth once daily 90 capsule 0  . EQ ASPIRIN ADULT LOW DOSE 81 MG EC tablet TAKE 1 TABLET BY MOUTH ONCE DAILY. SWALLOW WHOLE 90 tablet 0  . famotidine (PEPCID) 20 MG tablet Take 1 tablet (20 mg total) by mouth 2 (two) times daily. 60 tablet 6  . furosemide (LASIX) 40 MG tablet Take 1 tablet (40 mg total) by mouth 2 (two) times daily. 180 tablet 3  . hydrALAZINE (APRESOLINE) 10 MG tablet Take 1 tablet (  10 mg total) by mouth every 8 (eight) hours. 90 tablet 1  . HYDROcodone-acetaminophen (NORCO) 7.5-325 MG tablet Take 1 tablet by mouth 3 (three) times daily.  0  . Insulin Lispro Prot & Lispro (HUMALOG MIX 75/25 KWIKPEN) (75-25) 100 UNIT/ML Kwikpen Inject 160 Units into the skin daily with breakfast. 20 pen 11  . isosorbide mononitrate (IMDUR) 30 MG 24 hr tablet Take 1 tablet by mouth once daily 90 tablet 1  . Potassium Chloride ER 20 MEQ TBCR Take 20 mEq  by mouth daily. 90 tablet 3  . predniSONE (DELTASONE) 10 MG tablet Take 5 mg by mouth daily with breakfast.     . pregabalin (LYRICA) 100 MG capsule Take 1 capsule (100 mg total) by mouth 3 (three) times daily. 90 capsule 3  . sacubitril-valsartan (ENTRESTO) 97-103 MG Take 1 tablet by mouth 2 (two) times daily. 60 tablet 6  . Secukinumab (COSENTYX) 150 MG/ML SOSY Inject 150 mg into the skin every 28 (twenty-eight) days.     . Tafamidis (VYNDAMAX) 61 MG CAPS Take 61 mg by mouth daily. 30 capsule 11  . zolpidem (AMBIEN CR) 6.25 MG CR tablet Take 1 tablet (6.25 mg total) by mouth at bedtime as needed for sleep. Each prescription to last 1 month. 30 tablet 3  . Accu-Chek FastClix Lancets MISC Use one strip to monitor glucose levels BID; E11.42 102 each 11  . Blood Glucose Monitoring Suppl (ACCU-CHEK GUIDE) w/Device KIT 1 each by Does not apply route 2 (two) times a day. Use to monitor glucose levels BID; E11.42 1 kit 0  . Blood Pressure KIT 1 each by Does not apply route 2 (two) times daily. 1 each 0  . glucose blood (ACCU-CHEK GUIDE) test strip Use one strip to monitor glucose levels BID; E11.42 100 each 12  . Insulin Pen Needle (PEN NEEDLES) 31G X 6 MM MISC 2 Devices by Does not apply route daily. 180 each 3   No current facility-administered medications on file prior to visit.     Allergies  Allergen Reactions  . Acetaminophen     Social History   Socioeconomic History  . Marital status: Married    Spouse name: Amethyst  . Number of children: 8  . Years of education: 44  . Highest education level: Some college, no degree  Occupational History    Comment: disabled  Social Needs  . Financial resource strain: Hard  . Food insecurity    Worry: Often true    Inability: Often true  . Transportation needs    Medical: Yes    Non-medical: Yes  Tobacco Use  . Smoking status: Former Smoker    Packs/day: 0.50    Years: 10.00    Pack years: 5.00    Types: Cigarettes    Quit date:  04/11/2008    Years since quitting: 11.0  . Smokeless tobacco: Never Used  Substance and Sexual Activity  . Alcohol use: No    Alcohol/week: 0.0 standard drinks  . Drug use: No  . Sexual activity: Yes    Partners: Female    Birth control/protection: None  Lifestyle  . Physical activity    Days per week: Not on file    Minutes per session: Not on file  . Stress: Not on file  Relationships  . Social Herbalist on phone: Not on file    Gets together: Not on file    Attends religious service: Not on file    Active member of  club or organization: Not on file    Attends meetings of clubs or organizations: Not on file    Relationship status: Not on file  . Intimate partner violence    Fear of current or ex partner: Not on file    Emotionally abused: Not on file    Physically abused: Not on file    Forced sexual activity: Not on file  Other Topics Concern  . Not on file  Social History Narrative   Lives with wife. Does not work.  On disability.    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 Provided crisis assistance program information to assist with bills       Has issues getting food- gets $36/month in food stamps- already has list of food pantries but has not tried them- encouraged pt to try food pantries and reach out to clinic for help if needed    Family History  Problem Relation Age of Onset  . Hypertension Mother   . Diabetes Mother   . COPD Mother   . Lung cancer Mother        Smoker   . Hypertension Father   . Diabetes Father   . Heart Problems Father   . COPD Father   . Colon cancer Neg Hx   . Stomach cancer Neg Hx     Past Surgical History:  Procedure Laterality Date  . CARDIAC CATHETERIZATION  05/2014   ablation for atrial fibrillation  . CARDIAC CATHETERIZATION N/A  11/16/2015   Procedure: Left Heart Cath and Coronary Angiography;  Surgeon: Leonie Man, MD;  Location: [facility];  Service: Cardiovascular;  Laterality: N/A;  . COLON RESECTION  09/2013   due to large, abnormal polpy. non cancerous per patient.   . COLON SURGERY  09/2014   colon resection   . LEFT HEART CATH AND CORONARY ANGIOGRAPHY N/A 05/19/2018   Procedure: LEFT HEART CATH AND CORONARY ANGIOGRAPHY;  Surgeon: Wellington Hampshire, MD;  Location: [facility];  Service: Cardiovascular;  Laterality: N/A;    ROS: Review of Systems Negative except as stated above  PHYSICAL EXAM: BP 124/73 (BP Location: Right Arm, Patient Position: Sitting, Cuff Size: Large)   Pulse 63   Temp 98.1 F (36.7 C) (Oral)   Resp 16   Wt 246 lb (111.6 kg)   SpO2 95%   BMI 39.71 kg/m   Wt Readings from Last 3 Encounters:  05/02/19 246 lb (111.6 kg)  02/22/19 244 lb 3.2 oz (110.8 kg)  10/20/18 239 lb (108.4 kg)    Physical Exam   General appearance - alert, well appearing, and in no distress Mental status - normal mood, behavior, speech, dress, motor activity, and thought processes Neck - supple, no significant adenopathy Chest - clear to auscultation, no wheezes, rales or rhonchi, symmetric air entry Heart - normal rate, regular rhythm, normal S1, S2, no murmurs, rubs, clicks or gallops Extremities -trace LE edema Neuro: No jerking or tremors noted in the extremities Results for orders placed or performed in visit on 05/02/19  HgB A1c  Result Value Ref Range   Hemoglobin A1C     HbA1c POC (<> result, manual entry)     HbA1c, POC (prediabetic range)     HbA1c, POC (controlled diabetic range) 7.2 (A)  0.0 - 7.0 %  Glucose (CBG)  Result Value Ref Range   POC Glucose 110 (A) 70 - 99 mg/dl    CMP Latest Ref Rng & Units 04/22/2019 09/13/2018 08/31/2018  Glucose 65 - 99 mg/dL 54(L) - 180(H)  BUN 8 - 27 mg/dL 20 - 19  Creatinine 0.76 - 1.27 mg/dL 1.83(H) - 1.59(H)  Sodium 134 - 144 mmol/L  146(H) - 141  Potassium 3.5 - 5.2 mmol/L 3.6 - 3.7  Chloride 96 - 106 mmol/L 104 - 101  CO2 20 - 29 mmol/L 27 - 23  Calcium 8.6 - 10.2 mg/dL 8.5(L) - 8.9  Total Protein 6.0 - 8.5 g/dL 6.9 7.0 -  Total Bilirubin 0.0 - 1.2 mg/dL 0.4 0.3 -  Alkaline Phos 39 - 117 IU/L 52 69 -  AST 0 - 40 IU/L 7 11 -  ALT 0 - 44 IU/L 10 21 -   Lipid Panel     Component Value Date/Time   CHOL 126 04/22/2019 1121   TRIG 134 04/22/2019 1121   HDL 32 (L) 04/22/2019 1121   CHOLHDL 3.9 04/22/2019 1121   CHOLHDL 2.6 06/10/2016 1253   VLDL 18 06/10/2016 1253   LDLCALC 143 (H) 09/13/2018 1044    CBC    Component Value Date/Time   WBC 6.9 04/22/2019 1121   WBC 7.9 05/20/2018 0420   RBC 4.45 04/22/2019 1121   RBC 4.68 05/20/2018 0420   HGB 13.2 04/22/2019 1121   HCT 41.1 04/22/2019 1121   PLT 199 04/22/2019 1121   MCV 92 04/22/2019 1121   MCH 29.7 04/22/2019 1121   MCH 29.5 05/20/2018 0420   MCHC 32.1 04/22/2019 1121   MCHC 31.4 05/20/2018 0420   RDW 14.0 04/22/2019 1121   LYMPHSABS 1.8 10/20/2018 1631   MONOABS 1.1 (H) 08/24/2017 1819   EOSABS 0.1 10/20/2018 1631   BASOSABS 0.1 10/20/2018 1631    ASSESSMENT AND PLAN: 1. Myoclonic jerking Resolved. Patient wanting to rechallenge himself with Tizanidine.  I recommend against it but if he does I recommend taking it once 3 times a week instead of every day.  2. Stage 3 chronic kidney disease ([place]) Patient to follow-up with his kidney specialist.  3. Uncontrolled type 2 diabetes mellitus with diabetic neuropathy (HCC) A1c is improved.  He will continue his current dose of insulin.  Encouraged to continue healthy eating habits. - HgB A1c - Glucose (CBG)     Patient was given the opportunity to ask questions.  Patient verbalized understanding of the plan and was able to repeat key elements of the plan.   Orders Placed This Encounter  Procedures  . HgB A1c  . Glucose (CBG)     Requested Prescriptions    No prescriptions requested or  ordered in this encounter    No follow-ups on file.  Karle Plumber, MD, FACP

## 2019-05-06 ENCOUNTER — Ambulatory Visit: Payer: Medicaid Other | Admitting: Endocrinology

## 2019-05-06 ENCOUNTER — Other Ambulatory Visit: Payer: Self-pay

## 2019-05-06 ENCOUNTER — Encounter: Payer: Self-pay | Admitting: Endocrinology

## 2019-05-06 VITALS — BP 128/60 | HR 74 | Ht 66.0 in | Wt 246.2 lb

## 2019-05-06 DIAGNOSIS — Z794 Long term (current) use of insulin: Secondary | ICD-10-CM | POA: Diagnosis not present

## 2019-05-06 DIAGNOSIS — N183 Chronic kidney disease, stage 3 unspecified: Secondary | ICD-10-CM

## 2019-05-06 DIAGNOSIS — E1122 Type 2 diabetes mellitus with diabetic chronic kidney disease: Secondary | ICD-10-CM | POA: Diagnosis not present

## 2019-05-06 NOTE — Progress Notes (Signed)
Subjective:    Patient ID: Marco [name], male    DOB: [birth year], 63 y.o.   MRN: [id number]  HPI Pt returns for f/u of diabetes mellitus: DM type: Insulin-requiring type 2 Dx'ed: 2376 Complications: polyneuropathy, CAD, NPDR, and renal failure.  Therapy: insulin since soon after dx DKA: never Severe hypoglycemia: never.  Pancreatitis: never.  Pancreatic imaging: never.  Other: he chronically takes prednisone, for psoriasis, with no plan for taper; he declined to continue multiple daily injections.   Interval history: no cbg record, but states cbg's vary from 100-145 fasting.  He does not check later in the day.  pt says he never misses the insulin.  pt states he feels well in general.  No change in chronic prednisone dosage (5 mg qd).  He takes 160 units qam.  Past Medical History:  Diagnosis Date  . Benign colon polyp 08/01/2013   [facility] in [place]. large base tranverse colon polyp was biopsied. polyp was benign with minimal surface hyperplastic change.  . Chronic combined systolic and diastolic CHF (congestive heart failure) ([place])   . Chronic pain   . CKD (chronic kidney disease), stage III ([place])   . Diabetes mellitus without complication ([place])   . Diverticulosis   . Esophageal hiatal hernia 07/29/2013   confirmed on EGD   . Esophageal stricture   . Essential hypertension   . Gastritis 07/29/2013   confirmed on EGD, bx done an negative for intestinal metaplasia, dsyplasia or H. pylori. normal gastric emptying study done 07/13/2013.  [name] GERD (gastroesophageal reflux disease)   . Morbid obesity ([place])   . Non-obstructive CAD    a. 02/2013 Cath ([place]): nonobs dzs;  b. 09/2014 Myoview ([place]): EF 55%, no ischemia;  c. Cath 05/2018 mild CAD no obstruction.  . Persistent atrial fibrillation    a. 02/2013 s/p rfca in [place], [place]-->prev on Xarelto, d/c'd 2/2 anemia, ? GIB.  [name] Rheumatoid arthritis ([place])   . Sigmoid diverticulosis 08/01/2013   confirmed on colonscopy.  record scanned into chart    Past Surgical History:  Procedure Laterality Date  . CARDIAC CATHETERIZATION  05/2014   ablation for atrial fibrillation  . CARDIAC CATHETERIZATION N/A 11/16/2015   Procedure: Left Heart Cath and Coronary Angiography;  Surgeon: Leonie Man, MD;  Location: [facility];  Service: Cardiovascular;  Laterality: N/A;  . COLON RESECTION  09/2013   due to large, abnormal polpy. non cancerous per patient.   . COLON SURGERY  09/2014   colon resection   . LEFT HEART CATH AND CORONARY ANGIOGRAPHY N/A 05/19/2018   Procedure: LEFT HEART CATH AND CORONARY ANGIOGRAPHY;  Surgeon: Wellington Hampshire, MD;  Location: [facility];  Service: Cardiovascular;  Laterality: N/A;    Social History   Socioeconomic History  . Marital status: Married    Spouse name: Amethyst  . Number of children: 8  . Years of education: 46  . Highest education level: Some college, no degree  Occupational History    Comment: disabled  Social Needs  . Financial resource strain: Hard  . Food insecurity    Worry: Often true    Inability: Often true  . Transportation needs    Medical: Yes    Non-medical: Yes  Tobacco Use  . Smoking status: Former Smoker    Packs/day: 0.50    Years: 10.00    Pack years: 5.00    Types: Cigarettes    Quit date: 04/11/2008    Years since quitting:  11.0  . Smokeless tobacco: Never Used  Substance and Sexual Activity  . Alcohol use: No    Alcohol/week: 0.0 standard drinks  . Drug use: No  . Sexual activity: Yes    Partners: Female    Birth control/protection: None  Lifestyle  . Physical activity    Days per week: Not on file    Minutes per session: Not on file  . Stress: Not on file  Relationships  . Social Herbalist on phone: Not on file    Gets together: Not on file    Attends religious service: Not on file    Active member of club or organization: Not on file    Attends meetings of clubs or organizations: Not on file     Relationship status: Not on file  . Intimate partner violence    Fear of current or ex partner: Not on file    Emotionally abused: Not on file    Physically abused: Not on file    Forced sexual activity: Not on file  Other Topics Concern  . Not on file  Social History Narrative   Lives with wife. Does not work.  On disability.    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 Provided crisis assistance program information to assist with bills       Has issues getting food- gets $36/month in food stamps- already has list of food pantries but has not tried them- encouraged pt to try food pantries and reach out to clinic for help if needed    Current Outpatient Medications on File Prior to Visit  Medication Sig Dispense Refill  . Accu-Chek FastClix Lancets MISC Use one strip to monitor glucose levels BID; E11.42 102 each 11  . albuterol (PROVENTIL HFA;VENTOLIN HFA) 108 (90 Base) MCG/ACT inhaler Inhale 2 puffs into the lungs every 6 (six) hours as needed for wheezing or shortness of breath. 1 Inhaler 0  . allopurinol (ZYLOPRIM) 100 MG tablet Take 100 mg by mouth daily.    [name] atorvastatin (LIPITOR) 40 MG tablet Take 1 tablet (40 mg total) by mouth daily. 90 tablet 3  . Blood Glucose Monitoring Suppl (ACCU-CHEK GUIDE) w/Device KIT 1 each by Does not apply route 2 (two) times a day. Use to monitor glucose levels BID; E11.42 1 kit 0  . Blood Pressure KIT 1 each by Does not apply route 2 (two) times daily. 1 each 0  . carvedilol (COREG) 12.5 MG tablet Take 1 tablet (12.5 mg total) by mouth 2 (two) times daily with a meal. 180 tablet 0  . DULoxetine (CYMBALTA) 20 MG capsule Take 1 capsule by mouth once daily 90 capsule 0  . EQ ASPIRIN ADULT LOW DOSE 81 MG EC tablet TAKE 1 TABLET BY MOUTH ONCE DAILY. SWALLOW WHOLE 90 tablet 0  .  famotidine (PEPCID) 20 MG tablet Take 1 tablet (20 mg total) by mouth 2 (two) times daily. 60 tablet 6  . furosemide (LASIX) 40 MG tablet Take 1 tablet (40 mg total) by mouth 2 (two) times daily. 180 tablet 3  . glucose blood (ACCU-CHEK GUIDE) test strip Use one strip to monitor glucose levels BID; E11.42 100 each 12  . hydrALAZINE (APRESOLINE) 10 MG tablet Take 1 tablet (10 mg total) by mouth every 8 (  eight) hours. 90 tablet 1  . HYDROcodone-acetaminophen (NORCO) 7.5-325 MG tablet Take 1 tablet by mouth 3 (three) times daily.  0  . Insulin Lispro Prot & Lispro (HUMALOG MIX 75/25 KWIKPEN) (75-25) 100 UNIT/ML Kwikpen Inject 160 Units into the skin daily with breakfast. 20 pen 11  . Insulin Pen Needle (PEN NEEDLES) 31G X 6 MM MISC 2 Devices by Does not apply route daily. 180 each 3  . isosorbide mononitrate (IMDUR) 30 MG 24 hr tablet Take 1 tablet by mouth once daily 90 tablet 1  . Potassium Chloride ER 20 MEQ TBCR Take 20 mEq by mouth daily. 90 tablet 3  . predniSONE (DELTASONE) 10 MG tablet Take 5 mg by mouth daily with breakfast.     . pregabalin (LYRICA) 100 MG capsule Take 1 capsule (100 mg total) by mouth 3 (three) times daily. 90 capsule 3  . sacubitril-valsartan (ENTRESTO) 97-103 MG Take 1 tablet by mouth 2 (two) times daily. 60 tablet 6  . Secukinumab (COSENTYX) 150 MG/ML SOSY Inject 150 mg into the skin every 28 (twenty-eight) days.     . Tafamidis (VYNDAMAX) 61 MG CAPS Take 61 mg by mouth daily. 30 capsule 11  . zolpidem (AMBIEN CR) 6.25 MG CR tablet Take 1 tablet (6.25 mg total) by mouth at bedtime as needed for sleep. Each prescription to last 1 month. 30 tablet 3   No current facility-administered medications on file prior to visit.     Allergies  Allergen Reactions  . Acetaminophen     Family History  Problem Relation Age of Onset  . Hypertension Mother   . Diabetes Mother   . COPD Mother   . Lung cancer Mother        Smoker   . Hypertension Father   . Diabetes Father    . Heart Problems Father   . COPD Father   . Colon cancer Neg Hx   . Stomach cancer Neg Hx     BP 128/60 (BP Location: Left Arm, Patient Position: Sitting, Cuff Size: Large)   Pulse 74   Ht '5\' 6"'$  (1.676 m)   Wt 246 lb 3.2 oz (111.7 kg)   SpO2 90%   BMI 39.74 kg/m    Review of Systems He denies hypoglycemia.      Objective:   Physical Exam VITAL SIGNS:  See vs page GENERAL: no distress Pulses: dorsalis pedis intact bilat.   MSK: no deformity of the feet CV: 1+ bilat leg edema Skin:  no ulcer on the feet.  normal color and temp on the feet. Neuro: sensation is intact to touch on the feet, but decreased from normal Ext: there is bilateral onychomycosis of the toenails  Lab Results  Component Value Date   HGBA1C 7.2 (A) 05/02/2019   Lab Results  Component Value Date   CREATININE 1.83 (H) 04/22/2019   BUN 20 04/22/2019   NA 146 (H) 04/22/2019   K 3.6 04/22/2019   CL 104 04/22/2019   CO2 27 04/22/2019       Assessment & Plan:  Insulin-requiring type 2 DM: this is the best control this pt should aim for, given this regimen, which does match insulin to his changing needs throughout the day Renal failure: in this setting, he does not need a PM insulin dose.  Patient Instructions  check your blood sugar twice a day.  vary the time of day when you check, between before the 3 meals, and at bedtime.  also check if you have symptoms  of your blood sugar being too high or too low.  please keep a record of the readings and bring it to your next appointment here (or you can bring the meter itself).  You can write it on any piece of paper.  please call us sooner if your blood sugar goes below 70, or if you have a lot of readings over 200.   Please continue the same insulin.     On this type of insulin schedule, you should eat meals on a regular schedule (especially lunch).  If a meal is missed or significantly delayed, your blood sugar could go low.   Please come back for a follow-up  appointment in 3 months.

## 2019-05-06 NOTE — Patient Instructions (Addendum)
check your blood sugar twice a day.  vary the time of day when you check, between before the 3 meals, and at bedtime.  also check if you have symptoms of your blood sugar being too high or too low.  please keep a record of the readings and bring it to your next appointment here (or you can bring the meter itself).  You can write it on any piece of paper.  please call us sooner if your blood sugar goes below 70, or if you have a lot of readings over 200.   Please continue the same insulin.     On this type of insulin schedule, you should eat meals on a regular schedule (especially lunch).  If a meal is missed or significantly delayed, your blood sugar could go low.   Please come back for a follow-up appointment in 3 months.

## 2019-05-07 ENCOUNTER — Other Ambulatory Visit (HOSPITAL_COMMUNITY): Payer: Self-pay | Admitting: Internal Medicine

## 2019-05-12 ENCOUNTER — Telehealth: Payer: Medicaid Other | Admitting: Cardiology

## 2019-05-17 ENCOUNTER — Emergency Department (HOSPITAL_COMMUNITY)
Admission: EM | Admit: 2019-05-17 | Discharge: 2019-05-17 | Disposition: A | Discharge status: Home / Self Care | Payer: Medicaid Other | Attending: Emergency Medicine | Admitting: Emergency Medicine

## 2019-05-17 ENCOUNTER — Other Ambulatory Visit: Payer: Self-pay

## 2019-05-17 ENCOUNTER — Encounter (HOSPITAL_COMMUNITY): Payer: Self-pay | Admitting: Emergency Medicine

## 2019-05-17 ENCOUNTER — Emergency Department (HOSPITAL_COMMUNITY): Payer: Medicaid Other

## 2019-05-17 DIAGNOSIS — R1011 Right upper quadrant pain: Secondary | ICD-10-CM | POA: Insufficient documentation

## 2019-05-17 DIAGNOSIS — Z794 Long term (current) use of insulin: Secondary | ICD-10-CM | POA: Diagnosis not present

## 2019-05-17 DIAGNOSIS — R519 Headache, unspecified: Secondary | ICD-10-CM | POA: Diagnosis not present

## 2019-05-17 DIAGNOSIS — M542 Cervicalgia: Secondary | ICD-10-CM | POA: Insufficient documentation

## 2019-05-17 DIAGNOSIS — Z87891 Personal history of nicotine dependence: Secondary | ICD-10-CM | POA: Insufficient documentation

## 2019-05-17 DIAGNOSIS — R55 Syncope and collapse: Secondary | ICD-10-CM | POA: Insufficient documentation

## 2019-05-17 DIAGNOSIS — E162 Hypoglycemia, unspecified: Secondary | ICD-10-CM

## 2019-05-17 DIAGNOSIS — R531 Weakness: Secondary | ICD-10-CM | POA: Diagnosis present

## 2019-05-17 DIAGNOSIS — R0789 Other chest pain: Secondary | ICD-10-CM | POA: Insufficient documentation

## 2019-05-17 DIAGNOSIS — M546 Pain in thoracic spine: Secondary | ICD-10-CM | POA: Insufficient documentation

## 2019-05-17 DIAGNOSIS — Z7982 Long term (current) use of aspirin: Secondary | ICD-10-CM | POA: Diagnosis not present

## 2019-05-17 DIAGNOSIS — N183 Chronic kidney disease, stage 3 unspecified: Secondary | ICD-10-CM | POA: Insufficient documentation

## 2019-05-17 DIAGNOSIS — M545 Low back pain: Secondary | ICD-10-CM | POA: Insufficient documentation

## 2019-05-17 DIAGNOSIS — Y9301 Activity, walking, marching and hiking: Secondary | ICD-10-CM | POA: Insufficient documentation

## 2019-05-17 DIAGNOSIS — M79621 Pain in right upper arm: Secondary | ICD-10-CM | POA: Diagnosis not present

## 2019-05-17 DIAGNOSIS — Y92481 Parking lot as the place of occurrence of the external cause: Secondary | ICD-10-CM | POA: Insufficient documentation

## 2019-05-17 DIAGNOSIS — M79622 Pain in left upper arm: Secondary | ICD-10-CM | POA: Diagnosis not present

## 2019-05-17 DIAGNOSIS — I2510 Atherosclerotic heart disease of native coronary artery without angina pectoris: Secondary | ICD-10-CM | POA: Insufficient documentation

## 2019-05-17 DIAGNOSIS — E1122 Type 2 diabetes mellitus with diabetic chronic kidney disease: Secondary | ICD-10-CM | POA: Diagnosis not present

## 2019-05-17 DIAGNOSIS — Z79899 Other long term (current) drug therapy: Secondary | ICD-10-CM | POA: Insufficient documentation

## 2019-05-17 DIAGNOSIS — R2243 Localized swelling, mass and lump, lower limb, bilateral: Secondary | ICD-10-CM | POA: Diagnosis not present

## 2019-05-17 DIAGNOSIS — R5383 Other fatigue: Secondary | ICD-10-CM | POA: Insufficient documentation

## 2019-05-17 DIAGNOSIS — I5042 Chronic combined systolic (congestive) and diastolic (congestive) heart failure: Secondary | ICD-10-CM | POA: Diagnosis not present

## 2019-05-17 DIAGNOSIS — W19XXXA Unspecified fall, initial encounter: Secondary | ICD-10-CM | POA: Diagnosis not present

## 2019-05-17 DIAGNOSIS — I130 Hypertensive heart and chronic kidney disease with heart failure and stage 1 through stage 4 chronic kidney disease, or unspecified chronic kidney disease: Secondary | ICD-10-CM | POA: Insufficient documentation

## 2019-05-17 DIAGNOSIS — E11649 Type 2 diabetes mellitus with hypoglycemia without coma: Secondary | ICD-10-CM | POA: Insufficient documentation

## 2019-05-17 DIAGNOSIS — Y999 Unspecified external cause status: Secondary | ICD-10-CM | POA: Diagnosis not present

## 2019-05-17 LAB — CBC WITH DIFFERENTIAL/PLATELET
Abs Immature Granulocytes: 0.03 10*3/uL (ref 0.00–0.07)
Basophils Absolute: 0 10*3/uL (ref 0.0–0.1)
Basophils Relative: 1 %
Eosinophils Absolute: 0.1 10*3/uL (ref 0.0–0.5)
Eosinophils Relative: 2 %
HCT: 41.9 % (ref 39.0–52.0)
Hemoglobin: 13.3 g/dL (ref 13.0–17.0)
Immature Granulocytes: 0 %
Lymphocytes Relative: 17 %
Lymphs Abs: 1.3 10*3/uL (ref 0.7–4.0)
MCH: 30.3 pg (ref 26.0–34.0)
MCHC: 31.7 g/dL (ref 30.0–36.0)
MCV: 95.4 fL (ref 80.0–100.0)
Monocytes Absolute: 1.1 10*3/uL — ABNORMAL HIGH (ref 0.1–1.0)
Monocytes Relative: 14 %
Neutro Abs: 5.1 10*3/uL (ref 1.7–7.7)
Neutrophils Relative %: 66 %
Platelets: 227 10*3/uL (ref 150–400)
RBC: 4.39 MIL/uL (ref 4.22–5.81)
RDW: 15.7 % — ABNORMAL HIGH (ref 11.5–15.5)
WBC: 7.7 10*3/uL (ref 4.0–10.5)
nRBC: 0 % (ref 0.0–0.2)

## 2019-05-17 LAB — COMPREHENSIVE METABOLIC PANEL
ALT: 21 U/L (ref 0–44)
AST: 13 U/L — ABNORMAL LOW (ref 15–41)
Albumin: 3.4 g/dL — ABNORMAL LOW (ref 3.5–5.0)
Alkaline Phosphatase: 49 U/L (ref 38–126)
Anion gap: 11 (ref 5–15)
BUN: 17 mg/dL (ref 8–23)
CO2: 30 mmol/L (ref 22–32)
Calcium: 8.6 mg/dL — ABNORMAL LOW (ref 8.9–10.3)
Chloride: 101 mmol/L (ref 98–111)
Creatinine, Ser: 1.58 mg/dL — ABNORMAL HIGH (ref 0.61–1.24)
GFR calc Af Amer: 53 mL/min — ABNORMAL LOW (ref >60)
GFR calc non Af Amer: 46 mL/min — ABNORMAL LOW (ref >60)
Glucose, Bld: 63 mg/dL — ABNORMAL LOW (ref 70–99)
Potassium: 3.7 mmol/L (ref 3.5–5.1)
Sodium: 142 mmol/L (ref 135–145)
Total Bilirubin: 0.9 mg/dL (ref 0.3–1.2)
Total Protein: 7.1 g/dL (ref 6.5–8.1)

## 2019-05-17 LAB — CBG MONITORING, ED
Glucose-Capillary: 124 mg/dL — ABNORMAL HIGH (ref 70–99)
Glucose-Capillary: 172 mg/dL — ABNORMAL HIGH (ref 70–99)

## 2019-05-17 LAB — URINALYSIS, ROUTINE W REFLEX MICROSCOPIC
Bilirubin Urine: NEGATIVE
Glucose, UA: NEGATIVE mg/dL
Hgb urine dipstick: NEGATIVE
Ketones, ur: NEGATIVE mg/dL
Leukocytes,Ua: NEGATIVE
Nitrite: NEGATIVE
Protein, ur: NEGATIVE mg/dL
Specific Gravity, Urine: 1.008 (ref 1.005–1.030)
pH: 8 (ref 5.0–8.0)

## 2019-05-17 MED ORDER — IOHEXOL 300 MG/ML  SOLN
75.0000 mL | Freq: Once | INTRAMUSCULAR | Status: AC | PRN
  Administered 2019-05-17: 75 mL via INTRAVENOUS

## 2019-05-17 MED ORDER — DEXTROSE 10 % IV SOLN: INTRAVENOUS | Status: DC

## 2019-05-17 NOTE — ED Notes (Signed)
Help get patient undress on the monitor did ekg shown to er doctor patient is resting with call bell in reach 

## 2019-05-17 NOTE — ED Notes (Signed)
Patient asked if he could eat. Asked provider ordered patient able to eat and drink. Gave patient a [place] sandwich meal and cranberry juice.

## 2019-05-17 NOTE — ED Provider Notes (Signed)
[facility] EMERGENCY DEPARTMENT Provider Note   CSN: [id number] Arrival date & time: 05/17/19  1301     History   Chief Complaint No chief complaint on file.   HPI Marco [name] is a 63 y.o. male.     HPI   Marco [name] is a 63 y.o. male, with a history of CKD stage III, DM, HTN, A. fib, obesity, presenting to the ED with syncopal episode that occurred shortly prior to arrival.  Patient states he was at the pharmacy, walked out to the parking lot, became "woozy," and ended up on the ground.  He thinks he might remember falling, but is not sure. Initial CBG with EMS reportedly 48.  Patient received D10 and lethargy improved.  Patient complains of pain to the back of his head, neck, back, right ribs.  He does note he intermittently has chronic neck and back pain. Most of his pain is mild to moderate and nonradiating.  Patient last ate last night.  Took 160 units Humalog 75/25 around 8 AM.  States he typically does not eat breakfast, takes his insulin, and then will eat lunch.  However, today he was not able to eat before he went to the store.  Denies fever/chills, N/V/D, chest pain prior to the fall, cough, shortness of breath, headache, neuro deficits, recent illness, vision abnormalities, or any other complaints.   Past Medical History:  Diagnosis Date  . Benign colon polyp 08/01/2013   [facility] in [place]. large base tranverse colon polyp was biopsied. polyp was benign with minimal surface hyperplastic change.  . Chronic combined systolic and diastolic CHF (congestive heart failure) (HCC)   . Chronic pain   . CKD (chronic kidney disease), stage III   . Diabetes mellitus without complication (HCC)   . Diverticulosis   . Esophageal hiatal hernia 07/29/2013   confirmed on EGD   . Esophageal stricture   . Essential hypertension   . Gastritis 07/29/2013   confirmed on EGD, bx done an negative for intestinal metaplasia, dsyplasia or H.  pylori. normal gastric emptying study done 07/13/2013.  . GERD (gastroesophageal reflux disease)   . Morbid obesity (HCC)   . Non-obstructive CAD    a. 02/2013 Cath ([place] nonobs dzs;  b. 09/2014 Myoview ([place]): EF 55%, no ischemia;  c. Cath 05/2018 mild CAD no obstruction.  . Persistent atrial fibrillation (HCC)    a. 02/2013 s/p rfca in [place], [place]-->prev on Xarelto, d/c'd 2/2 anemia, ? GIB.  . Rheumatoid arthritis (HCC)   . Sigmoid diverticulosis 08/01/2013   confirmed on colonscopy. record scanned into chart    Patient Active Problem List   Diagnosis Date Noted  . Gastroesophageal reflux disease without esophagitis 08/31/2018  . Muscle spasm 08/31/2018  . NICM (nonischemic cardiomyopathy) (HCC) 07/06/2018  . Hypokalemia   . Hyperlipidemia 12/23/2016  . Chronic combined systolic and diastolic CHF (congestive heart failure) (HCC) 09/09/2016  . Diabetic retinopathy (HCC) 07/22/2016  . Diabetic polyneuropathy associated with type 2 diabetes mellitus (HCC) 06/10/2016  . Vision changes 06/02/2016  . Myalgia and myositis 03/31/2016  . Shortness of breath 02/09/2016  . Chronic gouty arthritis 11/29/2015  . LBP (low back pain) 11/27/2015  . Arthralgia of multiple joints 11/27/2015  . 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HCC) 11/27/2015  . Breast pain in male 11/14/2015  . Sinusitis, chronic 10/16/2015  . Insomnia 10/16/2015  . New onset of headaches after age 50 09/20/2015  . OSA (obstructive sleep apnea) 07/12/2015  . Low serum testosterone   level 05/18/2015  . Depression 05/16/2015  . Morbid obesity due to excess calories ([place])   . Chronic pain   . Diabetes ([place])   . CAD (coronary artery disease) 12/24/2014  . Atrial fibrillation ([place]) 12/24/2014  . Essential hypertension 12/24/2014  . Chronic kidney disease 12/24/2014  . PUD (peptic ulcer disease) 12/24/2014    Past Surgical History:  Procedure Laterality Date  . CARDIAC CATHETERIZATION  05/2014   ablation for atrial fibrillation  .  CARDIAC CATHETERIZATION N/A 11/16/2015   Procedure: Left Heart Cath and Coronary Angiography;  Surgeon: Leonie Man, MD;  Location: [place] [facility];  Service: Cardiovascular;  Laterality: N/A;  . COLON RESECTION  09/2013   due to large, abnormal polpy. non cancerous per patient.   . COLON SURGERY  09/2014   colon resection   . LEFT HEART CATH AND CORONARY ANGIOGRAPHY N/A 05/19/2018   Procedure: LEFT HEART CATH AND CORONARY ANGIOGRAPHY;  Surgeon: Wellington Hampshire, MD;  Location: [place] [facility];  Service: Cardiovascular;  Laterality: N/A;        Home Medications    Prior to Admission medications   Medication Sig Start Date End Date Taking? Authorizing Provider  Accu-Chek FastClix Lancets MISC Use one strip to monitor glucose levels BID; E11.42 12/06/18   Renato Shin, MD  albuterol (PROVENTIL HFA;VENTOLIN HFA) 108 (90 Base) MCG/ACT inhaler Inhale 2 puffs into the lungs every 6 (six) hours as needed for wheezing or shortness of breath. 04/29/18   [name], MD  allopurinol (ZYLOPRIM) 100 MG tablet Take 100 mg by mouth daily.    [provider]  atorvastatin (LIPITOR) 40 MG tablet Take 1 tablet (40 mg total) by mouth daily. 09/14/18 09/14/19  Sueanne Margarita, MD  Blood Glucose Monitoring Suppl (ACCU-CHEK GUIDE) w/Device KIT 1 each by Does not apply route 2 (two) times a day. Use to monitor glucose levels BID; E11.42 12/06/18   Renato Shin, MD  Blood Pressure KIT 1 each by Does not apply route 2 (two) times daily. 09/09/16   Boykin Nearing, MD  carvedilol (COREG) 12.5 MG tablet Take 1 tablet (12.5 mg total) by mouth 2 (two) times daily with a meal. 12/06/18   Larey Dresser, MD  DULoxetine (CYMBALTA) 20 MG capsule Take 1 capsule by mouth once daily 04/04/19   [name], MD  ENTRESTO 97-103 MG Take 1 tablet by mouth twice daily 05/09/19   Bensimhon, Shaune Pascal, MD  EQ ASPIRIN ADULT LOW DOSE 81 MG EC tablet TAKE 1 TABLET BY MOUTH ONCE DAILY. SWALLOW WHOLE 02/04/19    [name], MD  famotidine (PEPCID) 20 MG tablet Take 1 tablet (20 mg total) by mouth 2 (two) times daily. 08/31/18   Elsie Stain, MD  furosemide (LASIX) 40 MG tablet Take 1 tablet (40 mg total) by mouth 2 (two) times daily. 07/13/18 07/13/19  [name] T, [name]  glucose blood (ACCU-CHEK GUIDE) test strip Use one strip to monitor glucose levels BID; E11.42 12/06/18   Renato Shin, MD  hydrALAZINE (APRESOLINE) 10 MG tablet Take 1 tablet (10 mg total) by mouth every 8 (eight) hours. 05/22/18   Cheryln Manly, NP  HYDROcodone-acetaminophen (NORCO) 7.5-325 MG tablet Take 1 tablet by mouth 3 (three) times daily. 03/21/18   [provider]  Insulin Lispro Prot & Lispro (HUMALOG MIX 75/25 KWIKPEN) (75-25) 100 UNIT/ML Kwikpen Inject 160 Units into the skin daily with breakfast. 02/28/19   Renato Shin, MD  Insulin Pen Needle (  PEN NEEDLES) 31G X 6 MM MISC 2 Devices by Does not apply route daily. 02/28/19   Renato Shin, MD  isosorbide mononitrate (IMDUR) 30 MG 24 hr tablet Take 1 tablet by mouth once daily 04/15/19   Sueanne Margarita, MD  Potassium Chloride ER 20 MEQ TBCR Take 20 mEq by mouth daily. 07/13/18 07/13/19  [name] T, [name]  predniSONE (DELTASONE) 10 MG tablet Take 5 mg by mouth daily with breakfast.     [provider]  pregabalin (LYRICA) 100 MG capsule Take 1 capsule (100 mg total) by mouth 3 (three) times daily. 05/25/18   [name], MD  Secukinumab (COSENTYX) 150 MG/ML SOSY Inject 150 mg into the skin every 28 (twenty-eight) days.  06/02/16   [provider]  Tafamidis (VYNDAMAX) 61 MG CAPS Take 61 mg by mouth daily. 09/20/18   Bensimhon, Shaune Pascal, MD  zolpidem (AMBIEN CR) 6.25 MG CR tablet Take 1 tablet (6.25 mg total) by mouth at bedtime as needed for sleep. Each prescription to last 1 month. 01/09/19   [name], MD    Family History Family History  Problem Relation Age of Onset  . Hypertension Mother   . Diabetes Mother   .  COPD Mother   . Lung cancer Mother        Smoker   . Hypertension Father   . Diabetes Father   . Heart Problems Father   . COPD Father   . Colon cancer Neg Hx   . Stomach cancer Neg Hx     Social History Social History   Tobacco Use  . Smoking status: Former Smoker    Packs/day: 0.50    Years: 10.00    Pack years: 5.00    Types: Cigarettes    Quit date: 04/11/2008    Years since quitting: 11.1  . Smokeless tobacco: Never Used  Substance Use Topics  . Alcohol use: No    Alcohol/week: 0.0 standard drinks  . Drug use: No     Allergies   Acetaminophen   Review of Systems Review of Systems  Constitutional: Negative for chills, diaphoresis and fever.  Eyes: Negative for visual disturbance.  Respiratory: Negative for cough and shortness of breath.   Cardiovascular: Negative for chest pain.  Gastrointestinal: Negative for abdominal pain, diarrhea, nausea and vomiting.  Musculoskeletal: Positive for back pain and neck pain.  Skin: Negative for wound.  Neurological: Positive for syncope. Negative for weakness and headaches.  All other systems reviewed and are negative.    Physical Exam Updated Vital Signs BP (!) 158/84   Pulse 62   Temp (!) 97.5 F (36.4 C) (Oral)   Resp 13   SpO2 95%   Physical Exam Vitals signs and nursing note reviewed.  Constitutional:      General: He is not in acute distress.    Appearance: He is well-developed. He is not diaphoretic.  HENT:     Head: Normocephalic.     Comments: Tenderness to the superior occipital region of the scalp without noted swelling, wounds, deformity, instability. No other abnormalities noted to the rest of the scalp or to the face.    Mouth/Throat:     Mouth: Mucous membranes are moist.     Pharynx: Oropharynx is clear.  Eyes:     Conjunctiva/sclera: Conjunctivae normal.  Neck:     Musculoskeletal: Neck supple.  Cardiovascular:     Rate and Rhythm: Normal rate and regular rhythm.     Pulses: Normal  pulses.          Radial pulses are 2+ on the right side and 2+ on the left side.       Posterior tibial pulses are 2+ on the right side and 2+ on the left side.     Heart sounds: Normal heart sounds.     Comments: Tactile temperature in the extremities appropriate and equal bilaterally. Pulmonary:     Effort: Pulmonary effort is normal. No respiratory distress.     Breath sounds: Normal breath sounds.  Chest:     Chest wall: Tenderness present. No deformity, swelling or crepitus.    Abdominal:     Palpations: Abdomen is soft.     Tenderness: There is abdominal tenderness. There is no guarding.       Comments: Tenderness to the right upper abdomen and right flank without noted bruising or swelling.  Musculoskeletal:     Right lower leg: Edema present.     Left lower leg: Edema present.     Comments: Tenderness along the midline cervical, thoracic, and lumbar spine without noted swelling, deformity, instability, or wounds.  Patient also has tenderness to the bilateral upper arms without swelling, color change, deformity, or instability noted.  Patient insists this pain is chronic, however.  No pain with range of motion of the joints of the upper or lower extremities.  Lymphadenopathy:     Cervical: No cervical adenopathy.  Skin:    General: Skin is warm and dry.  Neurological:     Mental Status: He is alert.     Comments: Sensation grossly intact to light touch in the extremities.  Grip strengths equal bilaterally.  Strength 5/5 in all extremities. Coordination intact. Cranial nerves III-XII grossly intact. No facial droop.   Psychiatric:        Mood and Affect: Mood and affect normal.        Speech: Speech normal.        Behavior: Behavior normal.      ED Treatments / Results  Labs (all labs ordered are listed, but only abnormal results are displayed) Labs Reviewed  COMPREHENSIVE METABOLIC PANEL - Abnormal; Notable for the following components:      Result Value    Glucose, Bld 63 (*)    Creatinine, Ser 1.58 (*)    Calcium 8.6 (*)    Albumin 3.4 (*)    AST 13 (*)    GFR calc non Af Amer 46 (*)    GFR calc Af Amer 53 (*)    All other components within normal limits  CBC WITH DIFFERENTIAL/PLATELET - Abnormal; Notable for the following components:   RDW 15.7 (*)    Monocytes Absolute 1.1 (*)    All other components within normal limits  URINALYSIS, ROUTINE W REFLEX MICROSCOPIC - Abnormal; Notable for the following components:   Color, Urine STRAW (*)    All other components within normal limits  CBG MONITORING, ED - Abnormal; Notable for the following components:   Glucose-Capillary 124 (*)    All other components within normal limits    EKG EKG Interpretation  Date/Time:  Tuesday May 17 2019 13:05:42 EDT Ventricular Rate:  100 PR Interval:    QRS Duration: 100 QT Interval:  415 QTC Calculation: 536 R Axis:   20 Text Interpretation:  probable afib vs sinus with low voltage p waves Low voltage, precordial leads Borderline T wave abnormalities Prolonged QT interval similar to prior 10/19 Reconfirmed by Butler, Michael ([id number]) on 05/17/2019 1:44:14 PM     Radiology No results found.  Procedures Procedures (including critical care time)  Medications Ordered in ED Medications - No data to display   Initial Impression / Assessment and Plan / ED Course  I have reviewed the triage vital signs and the nursing notes.  Pertinent labs & imaging results that were available during my care of the patient were reviewed by me and considered in my medical decision making (see chart for details).  Clinical Course as of May 16 1644  Tue May 17, 2019  1501 63-year-old male with fall possibly due to low blood sugar complaining of head neck chest abdominal pain.  Vital signs unremarkable.  Getting lab work and CT imaging.  Likely discharge if no acute findings.   [MB]    Clinical Course User Index [MB] Butler, Michael C, MD       Patient  presents with syncope versus near syncope.  Patient is nontoxic appearing, afebrile, not tachycardic, not tachypneic, not hypotensive, maintains excellent SPO2 on room air, and is in no apparent distress.  Incidence of hypoglycemia causing this episode tops the differential list, especially given history elements of poor oral intake today despite taking his insulin.   End of shift patient care handoff report given to Kaitlyn Albrizze, [name]. Plan: Labs and imaging pending.  Unsure patient's blood sugar stays stable.  Vitals:   05/17/19 1445 05/17/19 1515 05/17/19 1630 05/17/19 1645  BP: (!) 158/84  (!) 136/92 (!) 145/77  Pulse: 62  63 62  Resp: 13  16 16  Temp:  (!) 97.5 F (36.4 C)    TempSrc:  Oral    SpO2: 95%  96% 95%     Final Clinical Impressions(s) / ED Diagnoses   Final diagnoses:  None    ED Discharge Orders    None       ,  C, [name] 05/17/19 1657    Butler, Michael C, MD 05/17/19 1714  

## 2019-05-17 NOTE — ED Triage Notes (Signed)
Per EMS: [facility], felt weak and fell.  Pt was down approx  5-10 minutes.  EMS arrived, CBG 48, pt lethargic though A/0 x4.  EMS administered D10 - 25G.  Recheck of CBG 44. Most recent CBG 185 PTA.

## 2019-05-17 NOTE — ED Notes (Signed)
Spoke with provider does not need IV fluids.

## 2019-05-17 NOTE — ED Provider Notes (Signed)
Care assumed from [place] [name] at shift change pending labs and multiple CTs.  See his note for full H&P.   Briefly, pt had episode of hypoglycemia and [facility] parking lot. CBG 46 for EMS, given D10. Pt reports he took his Insulin this AM 160 units humalog but did not eat breakfast or lunch today. Per previous provider pt has pain in head, neck, back, right side of chest radiating to abdomen.  Plan is for discharge home if imaging and labs are negative    Physical Exam  BP (!) 176/92   Pulse 66   Temp (!) 97.5 F (36.4 C) (Oral)   Resp (!) 22   SpO2 96%   Physical Exam  PE: Constitutional: well-developed, well-nourished, no apparent distress HENT: normocephalic, atraumatic. Tenderness along C, T, L spine without overlying wounds Cardiovascular: normal rate and rhythm, distal pulses intact Pulmonary/Chest: effort normal; breath sounds clear and equal bilaterally; no wheezes or rales Abdominal: soft, non distended. Mild tenderness to right chest and abdomen without overlying ecchymosis, bleeding.  Musculoskeletal: full ROM. Bilateral non pitting edema, pt reports unchanged today Neurological: alert with goal directed thinking Skin: warm and dry, no rash, no diaphoresis Psychiatric: normal mood and affect, normal behavior  Nursing note and vital signs reviewed.   ED Course/Procedures   Clinical Course as of May 16 2013  Tue May 16, 6732  111 63 year old male with fall possibly due to low blood sugar complaining of head neck chest abdominal pain.  Vital signs unremarkable.  Getting lab work and CT imaging.  Likely discharge if no acute findings.   [MB]    Clinical Course User Index [MB] Hayden Rasmussen, MD    EKG Interpretation  Date/Time:  Tuesday May 17 2019 13:05:42 EDT Ventricular Rate:  100 PR Interval:    QRS Duration: 100 QT Interval:  415 QTC Calculation: 536 R Axis:   20 Text Interpretation:  probable afib vs sinus with low voltage p waves Low  voltage, precordial leads Borderline T wave abnormalities Prolonged QT interval similar to prior 10/19 Reconfirmed by Aletta Edouard [phone]) on 05/17/2019 1:44:14 PM   Results for orders placed or performed during the hospital encounter of 05/17/19 (from the past 24 hour(s))  CBG monitoring, ED     Status: Abnormal   Collection Time: 05/17/19  1:26 PM  Result Value Ref Range   Glucose-Capillary 124 (H) 70 - 99 mg/dL  Comprehensive metabolic panel     Status: Abnormal   Collection Time: 05/17/19  3:07 PM  Result Value Ref Range   Sodium 142 135 - 145 mmol/L   Potassium 3.7 3.5 - 5.1 mmol/L   Chloride 101 98 - 111 mmol/L   CO2 30 22 - 32 mmol/L   Glucose, Bld 63 (L) 70 - 99 mg/dL   BUN 17 8 - 23 mg/dL   Creatinine, Ser 1.58 (H) 0.61 - 1.24 mg/dL   Calcium 8.6 (L) 8.9 - 10.3 mg/dL   Total Protein 7.1 6.5 - 8.1 g/dL   Albumin 3.4 (L) 3.5 - 5.0 g/dL   AST 13 (L) 15 - 41 U/L   ALT 21 0 - 44 U/L   Alkaline Phosphatase 49 38 - 126 U/L   Total Bilirubin 0.9 0.3 - 1.2 mg/dL   GFR calc non Af Amer 46 (L) >60 mL/min   GFR calc Af Amer 53 (L) >60 mL/min   Anion gap 11 5 - 15  CBC with Differential     Status: Abnormal  Collection Time: 05/17/19  3:07 PM  Result Value Ref Range   WBC 7.7 4.0 - 10.5 K/uL   RBC 4.39 4.22 - 5.81 MIL/uL   Hemoglobin 13.3 13.0 - 17.0 g/dL   HCT 41.9 39.0 - 52.0 %   MCV 95.4 80.0 - 100.0 fL   MCH 30.3 26.0 - 34.0 pg   MCHC 31.7 30.0 - 36.0 g/dL   RDW 15.7 (H) 11.5 - 15.5 %   Platelets 227 150 - 400 K/uL   nRBC 0.0 0.0 - 0.2 %   Neutrophils Relative % 66 %   Neutro Abs 5.1 1.7 - 7.7 K/uL   Lymphocytes Relative 17 %   Lymphs Abs 1.3 0.7 - 4.0 K/uL   Monocytes Relative 14 %   Monocytes Absolute 1.1 (H) 0.1 - 1.0 K/uL   Eosinophils Relative 2 %   Eosinophils Absolute 0.1 0.0 - 0.5 K/uL   Basophils Relative 1 %   Basophils Absolute 0.0 0.0 - 0.1 K/uL   Immature Granulocytes 0 %   Abs Immature Granulocytes 0.03 0.00 - 0.07 K/uL  Urinalysis, Routine w  reflex microscopic     Status: Abnormal   Collection Time: 05/17/19  3:25 PM  Result Value Ref Range   Color, Urine STRAW (A) YELLOW   APPearance CLEAR CLEAR   Specific Gravity, Urine 1.008 1.005 - 1.030   pH 8.0 5.0 - 8.0   Glucose, UA NEGATIVE NEGATIVE mg/dL   Hgb urine dipstick NEGATIVE NEGATIVE   Bilirubin Urine NEGATIVE NEGATIVE   Ketones, ur NEGATIVE NEGATIVE mg/dL   Protein, ur NEGATIVE NEGATIVE mg/dL   Nitrite NEGATIVE NEGATIVE   Leukocytes,Ua NEGATIVE NEGATIVE  POC CBG, ED     Status: Abnormal   Collection Time: 05/17/19  4:54 PM  Result Value Ref Range   Glucose-Capillary 172 (H) 70 - 99 mg/dL   Comment 1 Notify RN    Comment 2 Document in Chart     CT CHEST, ABDOMEN, AND PELVIS WITH CONTRAST    TECHNIQUE:  Multidetector CT imaging of the chest, abdomen and pelvis was  performed following the standard protocol during bolus  administration of intravenous contrast.    CONTRAST: 42mL OMNIPAQUE IOHEXOL 300 MG/ML SOLN    COMPARISON: 07/13/2017 chest CT angiogram.    FINDINGS:  CT CHEST FINDINGS    Cardiovascular: Top-normal heart size. No significant pericardial  fluid/thickening. Left anterior descending coronary atherosclerosis.  Mildly atherosclerotic nonaneurysmal thoracic aorta. Aortic arch  branch vessels are patent. Normal caliber pulmonary arteries. No  evidence of acute thoracic aortic injury. No central pulmonary  emboli.    Mediastinum/Nodes: No pneumomediastinum. No mediastinal hematoma. No  discrete thyroid nodules. Unremarkable esophagus. No axillary,  mediastinal or hilar lymphadenopathy.    Lungs/Pleura: No pneumothorax. No pleural effusion. Mild paraseptal  emphysema. No acute consolidative airspace disease, lung masses or  significant pulmonary nodules. No pneumatoceles.    Musculoskeletal: No aggressive appearing focal osseous lesions. No  fracture detected in the chest. Symmetric mild bilateral  gynecomastia, unchanged.    CT  ABDOMEN PELVIS FINDINGS    Hepatobiliary: Normal liver with no liver laceration or mass. Normal  gallbladder with no radiopaque cholelithiasis. No biliary ductal  dilatation.    Pancreas: Normal, with no laceration, mass or duct dilation.    Spleen: Normal size. No laceration or mass.    Adrenals/Urinary Tract: No discrete adrenal nodules. Nonobstructing  3 mm interpolar left renal stone. Nonobstructing punctate interpolar  right renal stone. No hydronephrosis. No renal laceration. Exophytic  3.1  cm renal mass in the posterior upper right kidney with density  62 HU (series 3/image 61). Exophytic low-attenuation 1.9 cm renal  cortical lesion in the anterior interpolar left kidney with density  20 HU (series 3/image 60). Normal bladder.    Stomach/Bowel: Grossly normal stomach. Status post right  hemicolectomy with ileocolic anastomosis in the right abdomen.  Normal caliber small bowel with no small bowel wall thickening.  Moderate left colonic diverticulosis, with no large bowel wall  thickening or pericolonic fat stranding.    Vascular/Lymphatic: Atherosclerotic nonaneurysmal abdominal aorta.  Patent portal, splenic, hepatic and renal veins. No pathologically  enlarged lymph nodes in the abdomen or pelvis.    Reproductive: Normal size prostate with nonspecific internal  prostatic calcifications.    Other: No pneumoperitoneum, ascites or focal fluid collection. Small  fat containing umbilical hernia.    Musculoskeletal: No aggressive appearing focal osseous lesions. No  fracture in the abdomen or pelvis. Mild lumbar spondylosis.    IMPRESSION:  1. No acute traumatic injury in the chest, abdomen or pelvis.  2. Indeterminate bilateral renal masses, largest 3.1 cm in the  posterior upper right kidney, renal cell carcinoma not excluded.  Further evaluation on a short term outpatient basis recommended with  MRI (preferred) or CT abdomen without and with IV contrast.  3.  Chronic findings include: Aortic Atherosclerosis (ICD10-I70.0)  and Emphysema (ICD10-J43.9). Coronary atherosclerosis.  Nonobstructing bilateral nephrolithiasis. Moderate left colonic  diverticulosis.      Electronically Signed  By: Ilona Sorrel M.D.  On: 05/17/2019 19:23    MDM   Pt received in sign out pending labs and imaging.   CMP shows creatinine has improved compared to three weeks ago, no severe electrolyte derangements. UA is without signs of infection. CBC unremarkable, no leukocytosis, hemoglobin of 13.3 comparable to baseline. Glucose has been stable while in ED.  Pt had CT scans of A/P, Chest, Head and cervical spine. I viewed all and no acute abnormality was found. It was noted on right kidney he has 3.1 cm renal mass. Pt made aware he needs to follow up with pcp for possible mri or another CT. Discussed all findings with patient.  He has history of combined chronic diastolic and systolic heart failure with an EF of 40 to 45% on last echo 6 months ago.  Discussed possibility of admission for observation given this could have been a syncopal event.  He is adamant this felt like an episode of hypoglycemia as he has had these in the past.  He is declining admission today.  The patient appears reasonably screened and/or stabilized for discharge. The patient is safe for discharge with strict return precautions discussed. Recommend pcp and cardiology follow-up.  Patient is reliable for follow-up. Findings and plan of care discussed with supervising physician Dr. Tyrone Nine.      Anna-Marie Coller, Harley Hallmark, [name] 05/17/19 2015    Deno Etienne, DO 05/17/19 2320

## 2019-05-17 NOTE — Discharge Instructions (Signed)
You have been seen today for low blood sugar. Please read and follow all provided instructions. Return to the emergency room for worsening condition or new concerning symptoms.    The CT scan of your belly today shows 3.1 cm renal mass in the posterior upper right kidney . Please follow up with your doctor for possible MRI or another CT scan.   1. Medications:  Continue usual home medications Take medications as prescribed. Please review all of the medicines and only take them if you do not have an allergy to them.   2. Treatment: rest, drink plenty of fluids  3. Follow Up: Please follow up with your primary doctor in 2-5 days for discussion of your diagnoses and further evaluation after today's visit; Call tomorrow to arrange your follow up.   -Please also follow up with cardiology. Call the office tomorrow to schedule follow up.   ?

## 2019-05-18 ENCOUNTER — Telehealth: Payer: Self-pay | Admitting: Internal Medicine

## 2019-05-18 NOTE — Telephone Encounter (Signed)
Patient called they found a tumor on his kidney and wants to see you asap. I made the appointment for 06/17/2019 your soon opening.

## 2019-05-18 NOTE — Telephone Encounter (Signed)
There is only a TCC slot left

## 2019-05-19 ENCOUNTER — Other Ambulatory Visit: Payer: Self-pay | Admitting: Internal Medicine

## 2019-05-19 DIAGNOSIS — G4700 Insomnia, unspecified: Secondary | ICD-10-CM

## 2019-05-24 ENCOUNTER — Encounter: Payer: Self-pay | Admitting: Internal Medicine

## 2019-05-24 ENCOUNTER — Ambulatory Visit (INDEPENDENT_AMBULATORY_CARE_PROVIDER_SITE_OTHER): Payer: Medicaid Other | Admitting: Internal Medicine

## 2019-05-24 DIAGNOSIS — E11649 Type 2 diabetes mellitus with hypoglycemia without coma: Secondary | ICD-10-CM | POA: Diagnosis not present

## 2019-05-24 DIAGNOSIS — N2889 Other specified disorders of kidney and ureter: Secondary | ICD-10-CM | POA: Diagnosis not present

## 2019-05-24 DIAGNOSIS — R251 Tremor, unspecified: Secondary | ICD-10-CM | POA: Diagnosis not present

## 2019-05-24 DIAGNOSIS — N289 Disorder of kidney and ureter, unspecified: Secondary | ICD-10-CM

## 2019-05-24 MED ORDER — HYDRALAZINE HCL 50 MG PO TABS: 50.0000 mg | ORAL_TABLET | Freq: Three times a day (TID) | ORAL | 4 refills | Status: DC

## 2019-05-24 NOTE — Progress Notes (Signed)
Virtual Visit via Telephone Note Due to current restrictions/limitations of in-office visits due to the COVID-19 pandemic, this scheduled clinical appointment was converted to a telehealth visit  I connected with Marco [name] on 05/24/19 at 11:49 a.m by telephone and verified that I am speaking with the correct person using two identifiers. I am in my office.  The patient is at home.  Only the patient and myself participated in this encounter.  I discussed the limitations, risks, security and privacy concerns of performing an evaluation and management service by telephone and the availability of in person appointments. I also discussed with the patient that there may be a patient responsible charge related to this service. The patient expressed understanding and agreed to proceed.   History of Present Illness: history of HTN,DM2 with neuropathy, retinopathyand nephropathy, CKD stage III, obesity, nonobstructive CAD/cardiac amyloid, chronic combined CHF, OSA on VPAP, followed by rheumatology in [facility] for psoriasis/gout/nonspec arthritis, chronic insomnia. On narcotics through [facility] for chronic pain inchest/back/shoulders/neck. Purpose of today's visit was follow-up from emergency room.  Patient was seen in the emergency room on 05/17/2019 for syncope or near syncope due to hypoglycemia.  Incidental finding on CAT scan of the abdomen revealed bilateral renal lesions the largest of which was 3.1 cm in the posterior upper pole of the right kidney.  Patient had called the office last week to report this and request appointment as soon as possible. -Per the ER note patient had taken his usual dose of Humalog 75/25 which is 160 units in the mornings.  According to their note he had told them that he did not eat breakfast and did not get a chance to eat lunch prior to going out to the pharmacy.  He felt that his blood sugar was low.  Patient blood sugar was found to be 48 by EMS.   -He tells me  that he does not skip meals.  He usually eats a light breakfast and then eats lunch and dinner.  This was the first time that he has had a low blood sugar in a while.  He checks blood sugars only in the mornings and he tells me the range has been in the 120s.  In regards to the lesion seen on his kidneys, he saw his nephrologist last week.  The nephrologist has ordered an MRI of the abdomen and pelvis to evaluate this further.  His other concern is that the "shaking his back."  He tells me he has had 3 episodes of shaking associated with unawareness of his surroundings and incontinence of urine on 2 of these occasions.  Last episode occurred 2 weekends ago while he was at the mall with his wife.  He had ordered some food and was sitting down when he became unaware of things.  He did not lose consciousness but was unable to ambulate and had to be taken to his wife's car in a wheelchair.  He did not go to the emergency room.  His blood sugar was not checked at that time. -Back in September, he was having jerking episodes of his extremities.  We felt it was likely due to tizanidine.  It resolved after he discontinued taking it.  He had similar episode last year where he had to be hospitalized and was found to have acute on chronic renal failure.  He is requesting refill on hydralazine.  I received a request from the pharmacy for 50 mg tablets to take 3 times daily.  His med list  however has 10 mg 3 times daily.  Patient verified with the bottle that he has at home that he is on 50 mg.  He requests a refill.   Observations/Objective: No direct observation done as this was a telephone encounter.  Assessment and Plan: 1. Episode of shaking -Patient reporting episodes of sudden unawareness of his surroundings associated with shaking and incontinence of urine on 2/3 occasions. -advise that the next time he has an episode like this whoever is with him he should check his blood sugar to make sure he is not  having hypoglycemic episode given the recent severe incident that he had last week that landed him in the emergency room. -Recommend that he keep a pack of glucose tabs in his pocket with him at all times -Recheck BMP to make sure there is no worsening of his kidney function -Referred to neurology to rule out idiopathic seizure disorder.  Told not to drive until this is evaluated - Ambulatory referral to Neurology  2. Hypoglycemic reaction to insulin in type 2 diabetes mellitus ([place]) -Advised of the importance of eating his 3 meals a day given the high dose of insulin that he is taking. -Always keep a snack of glucose tabs with him. -Advised to check blood sugars 3 times a day before meals just to make sure he is not having hypoglycemia at other times of the day.  3. Renal lesion -He will get the MRI of the abdomen/pelvis as ordered by his nephrologist.  Further management will be based on the results - Basic Metabolic Panel; Future   Follow Up Instructions: Keep his upcoming in person follow-up visit with me.  I discussed the assessment and treatment plan with the patient. The patient was provided an opportunity to ask questions and all were answered. The patient agreed with the plan and demonstrated an understanding of the instructions.   The patient was advised to call back or seek an in-person evaluation if the symptoms worsen or if the condition fails to improve as anticipated.  I provided 22 minutes of non-face-to-face time during this encounter.   Karle Plumber, MD

## 2019-05-25 ENCOUNTER — Ambulatory Visit: Payer: Medicaid Other

## 2019-05-31 ENCOUNTER — Other Ambulatory Visit: Payer: Self-pay | Admitting: Internal Medicine

## 2019-05-31 DIAGNOSIS — N2889 Other specified disorders of kidney and ureter: Secondary | ICD-10-CM

## 2019-06-14 ENCOUNTER — Telehealth (HOSPITAL_COMMUNITY): Payer: Self-pay | Admitting: Pharmacist

## 2019-06-14 ENCOUNTER — Other Ambulatory Visit: Payer: Self-pay | Admitting: Internal Medicine

## 2019-06-14 ENCOUNTER — Other Ambulatory Visit (HOSPITAL_COMMUNITY): Payer: Self-pay | Admitting: Internal Medicine

## 2019-06-14 DIAGNOSIS — G4700 Insomnia, unspecified: Secondary | ICD-10-CM

## 2019-06-14 NOTE — Telephone Encounter (Signed)
Please fill if appropriate request 

## 2019-06-14 NOTE — Telephone Encounter (Signed)
Patient Advocate Encounter   Received notification from [facility] Medicaid that prior authorization for [name] is required.   PA submitted on [facility] Tracks Conformation #: [id number] W Recipient ID: [id number] N Status is pending   Will continue to follow.  Audry Riles, PharmD, BCPS, CPP Heart Failure Clinic Pharmacist [phone]

## 2019-06-15 NOTE — Telephone Encounter (Signed)
Advanced Heart Failure Patient Advocate Encounter  Prior Authorization for [name] has been approved.    PA# [id number] Effective dates: 06/14/2019 through 06/08/2020  Patients co-pay is $3.00  Audry Riles, PharmD, BCPS, CPP Heart Failure Clinic Pharmacist [phone]'

## 2019-06-16 ENCOUNTER — Other Ambulatory Visit (HOSPITAL_COMMUNITY): Payer: Self-pay | Admitting: *Deleted

## 2019-06-16 MED ORDER — ENTRESTO 97-103 MG PO TABS: 1.0000 | ORAL_TABLET | Freq: Two times a day (BID) | ORAL | 0 refills | Status: DC

## 2019-06-17 ENCOUNTER — Other Ambulatory Visit: Payer: Self-pay

## 2019-06-17 ENCOUNTER — Ambulatory Visit: Payer: Medicaid Other | Attending: Internal Medicine | Admitting: Internal Medicine

## 2019-06-17 ENCOUNTER — Encounter: Payer: Self-pay | Admitting: Internal Medicine

## 2019-06-17 VITALS — BP 128/77 | HR 69 | Temp 99.5°F | Resp 16 | Wt 242.4 lb

## 2019-06-17 DIAGNOSIS — Z833 Family history of diabetes mellitus: Secondary | ICD-10-CM | POA: Insufficient documentation

## 2019-06-17 DIAGNOSIS — Z8249 Family history of ischemic heart disease and other diseases of the circulatory system: Secondary | ICD-10-CM | POA: Insufficient documentation

## 2019-06-17 DIAGNOSIS — Z79899 Other long term (current) drug therapy: Secondary | ICD-10-CM | POA: Diagnosis not present

## 2019-06-17 DIAGNOSIS — E1140 Type 2 diabetes mellitus with diabetic neuropathy, unspecified: Secondary | ICD-10-CM

## 2019-06-17 DIAGNOSIS — E854 Organ-limited amyloidosis: Secondary | ICD-10-CM | POA: Diagnosis not present

## 2019-06-17 DIAGNOSIS — N2889 Other specified disorders of kidney and ureter: Secondary | ICD-10-CM

## 2019-06-17 DIAGNOSIS — F40240 Claustrophobia: Secondary | ICD-10-CM | POA: Insufficient documentation

## 2019-06-17 DIAGNOSIS — J0101 Acute recurrent maxillary sinusitis: Secondary | ICD-10-CM | POA: Diagnosis not present

## 2019-06-17 DIAGNOSIS — M109 Gout, unspecified: Secondary | ICD-10-CM | POA: Insufficient documentation

## 2019-06-17 DIAGNOSIS — N183 Chronic kidney disease, stage 3 unspecified: Secondary | ICD-10-CM | POA: Diagnosis not present

## 2019-06-17 DIAGNOSIS — L4050 Arthropathic psoriasis, unspecified: Secondary | ICD-10-CM | POA: Insufficient documentation

## 2019-06-17 DIAGNOSIS — E11319 Type 2 diabetes mellitus with unspecified diabetic retinopathy without macular edema: Secondary | ICD-10-CM | POA: Insufficient documentation

## 2019-06-17 DIAGNOSIS — G253 Myoclonus: Secondary | ICD-10-CM | POA: Insufficient documentation

## 2019-06-17 DIAGNOSIS — E1142 Type 2 diabetes mellitus with diabetic polyneuropathy: Secondary | ICD-10-CM | POA: Insufficient documentation

## 2019-06-17 DIAGNOSIS — Z7982 Long term (current) use of aspirin: Secondary | ICD-10-CM | POA: Insufficient documentation

## 2019-06-17 DIAGNOSIS — Z794 Long term (current) use of insulin: Secondary | ICD-10-CM | POA: Diagnosis not present

## 2019-06-17 DIAGNOSIS — Z87891 Personal history of nicotine dependence: Secondary | ICD-10-CM | POA: Insufficient documentation

## 2019-06-17 DIAGNOSIS — I5042 Chronic combined systolic (congestive) and diastolic (congestive) heart failure: Secondary | ICD-10-CM | POA: Diagnosis not present

## 2019-06-17 DIAGNOSIS — G4733 Obstructive sleep apnea (adult) (pediatric): Secondary | ICD-10-CM | POA: Insufficient documentation

## 2019-06-17 DIAGNOSIS — K219 Gastro-esophageal reflux disease without esophagitis: Secondary | ICD-10-CM | POA: Insufficient documentation

## 2019-06-17 DIAGNOSIS — Z886 Allergy status to analgesic agent status: Secondary | ICD-10-CM | POA: Diagnosis not present

## 2019-06-17 DIAGNOSIS — Z825 Family history of asthma and other chronic lower respiratory diseases: Secondary | ICD-10-CM | POA: Diagnosis not present

## 2019-06-17 DIAGNOSIS — Z801 Family history of malignant neoplasm of trachea, bronchus and lung: Secondary | ICD-10-CM | POA: Insufficient documentation

## 2019-06-17 DIAGNOSIS — E1122 Type 2 diabetes mellitus with diabetic chronic kidney disease: Secondary | ICD-10-CM | POA: Diagnosis not present

## 2019-06-17 DIAGNOSIS — I130 Hypertensive heart and chronic kidney disease with heart failure and stage 1 through stage 4 chronic kidney disease, or unspecified chronic kidney disease: Secondary | ICD-10-CM | POA: Insufficient documentation

## 2019-06-17 DIAGNOSIS — E1165 Type 2 diabetes mellitus with hyperglycemia: Secondary | ICD-10-CM | POA: Diagnosis not present

## 2019-06-17 DIAGNOSIS — E785 Hyperlipidemia, unspecified: Secondary | ICD-10-CM | POA: Insufficient documentation

## 2019-06-17 DIAGNOSIS — I10 Essential (primary) hypertension: Secondary | ICD-10-CM

## 2019-06-17 DIAGNOSIS — I43 Cardiomyopathy in diseases classified elsewhere: Secondary | ICD-10-CM

## 2019-06-17 DIAGNOSIS — G8929 Other chronic pain: Secondary | ICD-10-CM | POA: Insufficient documentation

## 2019-06-17 DIAGNOSIS — I2510 Atherosclerotic heart disease of native coronary artery without angina pectoris: Secondary | ICD-10-CM | POA: Insufficient documentation

## 2019-06-17 DIAGNOSIS — R55 Syncope and collapse: Secondary | ICD-10-CM | POA: Diagnosis present

## 2019-06-17 DIAGNOSIS — Z79891 Long term (current) use of opiate analgesic: Secondary | ICD-10-CM | POA: Insufficient documentation

## 2019-06-17 DIAGNOSIS — Z6839 Body mass index (BMI) 39.0-39.9, adult: Secondary | ICD-10-CM | POA: Insufficient documentation

## 2019-06-17 DIAGNOSIS — IMO0002 Reserved for concepts with insufficient information to code with codable children: Secondary | ICD-10-CM

## 2019-06-17 LAB — GLUCOSE, POCT (MANUAL RESULT ENTRY): POC Glucose: 176 mg/dl — AB (ref 70–99)

## 2019-06-17 MED ORDER — AMOXICILLIN-POT CLAVULANATE 500-125 MG PO TABS: 1.0000 | ORAL_TABLET | Freq: Three times a day (TID) | ORAL | 0 refills | Status: DC

## 2019-06-17 MED ORDER — DIAZEPAM 2 MG PO TABS: ORAL_TABLET | ORAL | 0 refills | Status: DC

## 2019-06-17 NOTE — Patient Instructions (Signed)
I have sent a prescription to your pharmacy for Valium and for the antibiotics.  Make sure that someone accompanies you to the MRI as the Valium will make you drowsy.

## 2019-06-17 NOTE — Progress Notes (Signed)
Patient ID: Marco [name], male    DOB: 1956/05/08  MRN: [id number]  CC: Hospitalization Follow-up (ED)   Subjective: Marco [name] is a 63 y.o. male who presents for f/u on unawareness episodes His concerns today include:  history of HTN,DM2 with neuropathy, retinopathyand nephropathy, CKD stage III, obesity, nonobstructive CAD/cardiac amyloid, chronic combined CHF, OSA on VPAP, followed by rheumatology in [facility] for psoriasis/gout/nonspec arthritis, chronic insomnia. On narcotics through [facility] for chronic pain inchest/back/shoulders/neck.   We did a telephone visit about a month ago.  At that time patient reported intermittent episodes of unawareness with incontinence of urine on 2 of those occasions.  I question whether he was having hypoglycemia as on 1 of those occasions he was taken to the emergency room with blood sugars in the 40s.  He reports no further episodes since our last visit.  I had referred him to neurology.  Has appt with neurology 07/2019.  Was in [place] a few wks ago and had a feeling of weakness and lethargy.  No Loss of consciousness.  BS was above 100 -Jerking movements have decreased significantly and are more sporadic  RT renal lesion: MRI sch for next Tuesday  HTN/combined CHF: Compliant with medications.  He reports no increased shortness of breath, chest pains or lower extremity edema.  He tries to limit salt in the foods.  He is overdue for follow-up with [name].  DM:  BS have been 140-170.  No lows.   wgh down 4 lbs He is doing better with his eating habits.  He is followed by endocrinology [name]  He feels that he may have a sinus infection again.  He is having some discolored mucus and a lot of pain and pressure around the maxillary sinuses and below the eyes.  He has not had any fever. Patient Active Problem List   Diagnosis Date Noted  . Gastroesophageal reflux disease without esophagitis 08/31/2018  . Muscle spasm  08/31/2018  . NICM (nonischemic cardiomyopathy) ([place]) 07/06/2018  . Hypokalemia   . Hyperlipidemia 12/23/2016  . Chronic combined systolic and diastolic CHF (congestive heart failure) ([place]) 09/09/2016  . Diabetic retinopathy ([place]) 07/22/2016  . Diabetic polyneuropathy associated with type 2 diabetes mellitus ([place]) 06/10/2016  . Vision changes 06/02/2016  . Myalgia and myositis 03/31/2016  . Shortness of breath 02/09/2016  . Chronic gouty arthritis 11/29/2015  . LBP (low back pain) 11/27/2015  . Arthralgia of multiple joints 11/27/2015  . 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[place]) 11/27/2015  . Breast pain in male 11/14/2015  . Sinusitis, chronic 10/16/2015  . Insomnia 10/16/2015  . New onset of headaches after age 65 09/20/2015  . OSA (obstructive sleep apnea) 07/12/2015  . Low serum testosterone level 05/18/2015  . Depression 05/16/2015  . Morbid obesity due to excess calories ([place])   . Chronic pain   . Diabetes ([place])   . CAD (coronary artery disease) 12/24/2014  . Atrial fibrillation ([place]) 12/24/2014  . Essential hypertension 12/24/2014  . Chronic kidney disease 12/24/2014  . PUD (peptic ulcer disease) 12/24/2014     Current Outpatient Medications on File Prior to Visit  Medication Sig Dispense Refill  . Accu-Chek FastClix Lancets MISC Use one strip to monitor glucose levels BID; E11.42 102 each 11  . albuterol (PROVENTIL HFA;VENTOLIN HFA) 108 (90 Base) MCG/ACT inhaler Inhale 2 puffs into the lungs every 6 (six) hours as needed for wheezing or shortness of breath. 1 Inhaler 0  . allopurinol (ZYLOPRIM) 100 MG tablet Take 100 mg  by mouth daily.    [name] atorvastatin (LIPITOR) 40 MG tablet Take 1 tablet (40 mg total) by mouth daily. 90 tablet 3  . carvedilol (COREG) 12.5 MG tablet Take 1 tablet (12.5 mg total) by mouth 2 (two) times daily with a meal. 180 tablet 0  . DULoxetine (CYMBALTA) 20 MG capsule Take 1 capsule by mouth once daily 90 capsule 1  . EQ ASPIRIN ADULT LOW DOSE 81 MG EC tablet  TAKE 1 TABLET BY MOUTH ONCE DAILY. SWALLOW WHOLE 90 tablet 0  . famotidine (PEPCID) 20 MG tablet Take 1 tablet (20 mg total) by mouth 2 (two) times daily. 60 tablet 6  . furosemide (LASIX) 40 MG tablet Take 1 tablet (40 mg total) by mouth 2 (two) times daily. 180 tablet 3  . glucose blood (ACCU-CHEK GUIDE) test strip Use one strip to monitor glucose levels BID; E11.42 100 each 12  . hydrALAZINE (APRESOLINE) 50 MG tablet Take 1 tablet (50 mg total) by mouth every 8 (eight) hours. 90 tablet 4  . HYDROcodone-acetaminophen (NORCO) 10-325 MG tablet Take 1 tablet by mouth every 8 (eight) hours.    . Insulin Lispro Prot & Lispro (HUMALOG MIX 75/25 KWIKPEN) (75-25) 100 UNIT/ML Kwikpen Inject 160 Units into the skin daily with breakfast. 20 pen 11  . Insulin Pen Needle (PEN NEEDLES) 31G X 6 MM MISC 2 Devices by Does not apply route daily. 180 each 3  . isosorbide mononitrate (IMDUR) 30 MG 24 hr tablet Take 1 tablet by mouth once daily 90 tablet 1  . Potassium Chloride ER 20 MEQ TBCR Take 20 mEq by mouth daily. 90 tablet 3  . pregabalin (LYRICA) 100 MG capsule Take 1 capsule (100 mg total) by mouth 3 (three) times daily. 90 capsule 3  . sacubitril-valsartan (ENTRESTO) 97-103 MG Take 1 tablet by mouth 2 (two) times daily. 60 tablet 0  . Secukinumab (COSENTYX) 150 MG/ML SOSY Inject 150 mg into the skin every 28 (twenty-eight) days.     . Tafamidis (VYNDAMAX) 61 MG CAPS Take 61 mg by mouth daily. 30 capsule 11  . zolpidem (AMBIEN CR) 6.25 MG CR tablet Take 1 tablet (6.25 mg total) by mouth at bedtime as needed. for sleep.  To fill on or after 06/19/2019. Each prescription to last 1 month 30 tablet 2   No current facility-administered medications on file prior to visit.     Allergies  Allergen Reactions  . Acetaminophen     Social History   Socioeconomic History  . Marital status: Married    Spouse name: Amethyst  . Number of children: 8  . Years of education: 68  . Highest education level: Some  college, no degree  Occupational History    Comment: disabled  Social Needs  . Financial resource strain: Hard  . Food insecurity    Worry: Often true    Inability: Often true  . Transportation needs    Medical: Yes    Non-medical: Yes  Tobacco Use  . Smoking status: Former Smoker    Packs/day: 0.50    Years: 10.00    Pack years: 5.00    Types: Cigarettes    Quit date: 04/11/2008    Years since quitting: 11.1  . Smokeless tobacco: Never Used  Substance and Sexual Activity  . Alcohol use: No    Alcohol/week: 0.0 standard drinks  . Drug use: No  . Sexual activity: Yes    Partners: Female    Birth control/protection: None  Lifestyle  . Physical  activity    Days per week: Not on file    Minutes per session: Not on file  . Stress: Not on file  Relationships  . Social Herbalist on phone: Not on file    Gets together: Not on file    Attends religious service: Not on file    Active member of club or organization: Not on file    Attends meetings of clubs or organizations: Not on file    Relationship status: Not on file  . Intimate partner violence    Fear of current or ex partner: Not on file    Emotionally abused: Not on file    Physically abused: Not on file    Forced sexual activity: Not on file  Other Topics Concern  . Not on file  Social History Narrative   Lives with wife. Does not work.  On disability.    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 Provided crisis assistance program information to assist with bills       Has issues getting food- gets $36/month in food stamps- already has list of food pantries but has not tried them- encouraged pt to try food pantries and reach out to clinic for help if needed    Family History  Problem Relation Age of Onset  . Hypertension  Mother   . Diabetes Mother   . COPD Mother   . Lung cancer Mother        Smoker   . Hypertension Father   . Diabetes Father   . Heart Problems Father   . COPD Father   . Colon cancer Neg Hx   . Stomach cancer Neg Hx     Past Surgical History:  Procedure Laterality Date  . CARDIAC CATHETERIZATION  05/2014   ablation for atrial fibrillation  . CARDIAC CATHETERIZATION N/A 11/16/2015   Procedure: Left Heart Cath and Coronary Angiography;  Surgeon: Leonie Man, MD;  Location: [facility];  Service: Cardiovascular;  Laterality: N/A;  . COLON RESECTION  09/2013   due to large, abnormal polpy. non cancerous per patient.   . COLON SURGERY  09/2014   colon resection   . LEFT HEART CATH AND CORONARY ANGIOGRAPHY N/A 05/19/2018   Procedure: LEFT HEART CATH AND CORONARY ANGIOGRAPHY;  Surgeon: Wellington Hampshire, MD;  Location: [facility];  Service: Cardiovascular;  Laterality: N/A;    ROS: Review of Systems Negative except as stated above  PHYSICAL EXAM: BP 128/77   Pulse 69   Temp 99.5 F (37.5 C) (Oral)   Resp 16   Wt 242 lb 6.4 oz (110 kg)   SpO2 95%   BMI 39.12 kg/m   Wt Readings from Last 3 Encounters:  06/17/19 242 lb 6.4 oz (110 kg)  05/06/19 246 lb 3.2 oz (111.7 kg)  05/02/19 246 lb (111.6 kg)    Physical Exam  General appearance - alert, well appearing, and in no distress Mental status - normal mood, behavior, speech, dress, motor activity, and thought processes Nose -moderate to severe enlargement of the nasal turbinates.  No discolored mucus noted.  He has mild tenderness on palpation over the maxillary sinuses bilaterally Neck - supple, no significant adenopathy Chest - clear to auscultation, no wheezes, rales or rhonchi,  symmetric air entry Heart - normal rate, regular rhythm, normal S1, S2, no murmurs, rubs, clicks or gallops Extremities -no lower extremity edema Neurologic -power 5 out of 5 bilaterally in the upper and lower extremities.  No  abnormal movements noted at this time.  Cranial nerves grossly intact.  Gait is normal. CMP Latest Ref Rng & Units 05/17/2019 04/22/2019 09/13/2018  Glucose 70 - 99 mg/dL 63(L) 54(L) -  BUN 8 - 23 mg/dL 17 20 -  Creatinine 0.61 - 1.24 mg/dL 1.58(H) 1.83(H) -  Sodium 135 - 145 mmol/L 142 146(H) -  Potassium 3.5 - 5.1 mmol/L 3.7 3.6 -  Chloride 98 - 111 mmol/L 101 104 -  CO2 22 - 32 mmol/L 30 27 -  Calcium 8.9 - 10.3 mg/dL 8.6(L) 8.5(L) -  Total Protein 6.5 - 8.1 g/dL 7.1 6.9 7.0  Total Bilirubin 0.3 - 1.2 mg/dL 0.9 0.4 0.3  Alkaline Phos 38 - 126 U/L 49 52 69  AST 15 - 41 U/L 13(L) 7 11  ALT 0 - 44 U/L 21 10 21    Lipid Panel     Component Value Date/Time   CHOL 126 04/22/2019 1121   TRIG 134 04/22/2019 1121   HDL 32 (L) 04/22/2019 1121   CHOLHDL 3.9 04/22/2019 1121   CHOLHDL 2.6 06/10/2016 1253   VLDL 18 06/10/2016 1253   LDLCALC 70 04/22/2019 1121    CBC    Component Value Date/Time   WBC 7.7 05/17/2019 1507   RBC 4.39 05/17/2019 1507   HGB 13.3 05/17/2019 1507   HGB 13.2 04/22/2019 1121   HCT 41.9 05/17/2019 1507   HCT 41.1 04/22/2019 1121   PLT 227 05/17/2019 1507   PLT 199 04/22/2019 1121   MCV 95.4 05/17/2019 1507   MCV 92 04/22/2019 1121   MCH 30.3 05/17/2019 1507   MCHC 31.7 05/17/2019 1507   RDW 15.7 (H) 05/17/2019 1507   RDW 14.0 04/22/2019 1121   LYMPHSABS 1.3 05/17/2019 1507   LYMPHSABS 1.8 10/20/2018 1631   MONOABS 1.1 (H) 05/17/2019 1507   EOSABS 0.1 05/17/2019 1507   EOSABS 0.1 10/20/2018 1631   BASOSABS 0.0 05/17/2019 1507   BASOSABS 0.1 10/20/2018 1631    ASSESSMENT AND PLAN: 1. Myoclonic jerking This seems to have subsided.  However he will keep the appointment with neurology for further evaluation of his unawareness episodes that have been associated with incontinence.  Advised that he not drive until he has been fully evaluated by neurology.  2. Acute recurrent maxillary sinusitis - amoxicillin-clavulanate (AUGMENTIN) 500-125 MG tablet; Take 1  tablet (500 mg total) by mouth 3 (three) times daily.  Dispense: 21 tablet; Refill: 0  3. Uncontrolled type 2 diabetes mellitus with diabetic neuropathy ([place]) Followed by endocrinology - Glucose (CBG)  4. Essential hypertension Controlled.  Continue current medications  5. Chronic combined systolic and diastolic CHF (congestive heart failure) ([place]) Compensated.  Continue current medications.  We will get him back in with Dr. Ronna Polio - Ambulatory referral to Cardiology  6. Cardiac amyloidosis ([place]) See #5 above - Ambulatory referral to Cardiology  7. Renal mass, right Patient requested Valium to take at the time of his MRI due to severe claustrophobia.  Prescription given.  Advised that he take someone with him when he goes to have the MRI done.  He will not be driving    Patient was given the opportunity to ask questions.  Patient verbalized understanding of the plan and was able to repeat key elements of the  plan.   Orders Placed This Encounter  Procedures  . Glucose (CBG)     Requested Prescriptions    No prescriptions requested or ordered in this encounter    No follow-ups on file.  Karle Plumber, MD, FACP

## 2019-06-21 ENCOUNTER — Other Ambulatory Visit: Payer: Medicaid Other

## 2019-06-22 ENCOUNTER — Telehealth: Payer: Self-pay | Admitting: *Deleted

## 2019-06-22 ENCOUNTER — Telehealth: Payer: Medicaid Other | Admitting: Cardiology

## 2019-06-22 NOTE — Progress Notes (Deleted)
Cardiology Office Note:    Date:  06/22/2019   ID:  Marco [name], DOB [birth year], MRN [id number]  PCP:  [name], MD  Cardiologist:  [name], MD    Referring MD: [name], MD   No chief complaint on file.   History of Present Illness:    Marco [name] is a 63 y.o. male with a hx of combined systolic and diastolic heart failure, nonischemic cardiomyopathy, mild nonobstructive coronary artery disease by cardiac catheterization in 2017, persistent atrial fibrillation with prior history of ablation in [place] in 2017, diabetes, hypertension, chronic kidney disease, sleep apnea, obesity.  His EF was 45-50 but improved to normal by Echo in 01/2017.CHADS2-VASc=4 (CHF, DM, CAD, HTN).Anticoagulation for PAF has been discontinued in the past secondary to anemia. He has a history of chronic dyspnea and chronic chest discomfort. Evaluation by pulmonology included a cardiopulmonary stress test in 2017. However, this was not conclusive due to submaximal effort.He was seen in August 2019 for worsening shortness of breath. An echocardiogram in September 2019 demonstrated worsening LV function with an EF of 35-40%.   Admitted in October 2019 with chest pain and mildly elevated troponin. Echo demonstrated EF 25-30%. Cath showed mild plaque without obstructive disease. CHF medications were adjusted for nonischemic cardiomyopathy. He was evaluated by [name] in the advanced heart failure clinic in October 2019. PYP scan was strongly suggestive of transthyretin amyloidosis (Grade 2, H/CLL equal 1.94) but Myeloma panel with MBUS showed no myeloma.  Started on Tafamadis.  PYP scan was supposed to be repeated in August but not done and he did not followup in AHF clinic since 09/2018.  He has been on Entresto, Carvedilol, Hydralazine and Imdur as well as diuretics.     He is here today for followup and is doing well.  He denies any chest pain or pressure, SOB, DOE,  PND, orthopnea, LE edema, dizziness, palpitations or syncope. He is compliant with his meds and is tolerating meds with no SE.    Past Medical History:  Diagnosis Date  . Benign colon polyp 08/01/2013   [facility] in [place]. large base tranverse colon polyp was biopsied. polyp was benign with minimal surface hyperplastic change.  . Chronic combined systolic and diastolic CHF (congestive heart failure) ([place])   . Chronic pain   . CKD (chronic kidney disease), stage III   . Diabetes mellitus without complication ([place])   . Diverticulosis   . Esophageal hiatal hernia 07/29/2013   confirmed on EGD   . Esophageal stricture   . Essential hypertension   . Gastritis 07/29/2013   confirmed on EGD, bx done an negative for intestinal metaplasia, dsyplasia or H. pylori. normal gastric emptying study done 07/13/2013.  [name] GERD (gastroesophageal reflux disease)   . Morbid obesity ([place])   . Non-obstructive CAD    a. 02/2013 Cath ([place]): nonobs dzs;  b. 09/2014 Myoview ([place]): EF 55%, no ischemia;  c. Cath 05/2018 mild CAD no obstruction.  . Persistent atrial fibrillation ([place])    a. 02/2013 s/p rfca in [place], [place]-->prev on Xarelto, d/c'd 2/2 anemia, ? GIB.  [name] Rheumatoid arthritis ([place])   . Sigmoid diverticulosis 08/01/2013   confirmed on colonscopy. record scanned into chart    Past Surgical History:  Procedure Laterality Date  . CARDIAC CATHETERIZATION  05/2014   ablation for atrial fibrillation  . CARDIAC CATHETERIZATION N/A 11/16/2015   Procedure: Left Heart Cath and Coronary Angiography;  Surgeon: Leonie Man, MD;  Location:  [place] [facility];  Service: Cardiovascular;  Laterality: N/A;  . COLON RESECTION  09/2013   due to large, abnormal polpy. non cancerous per patient.   . COLON SURGERY  09/2014   colon resection   . LEFT HEART CATH AND CORONARY ANGIOGRAPHY N/A 05/19/2018   Procedure: LEFT HEART CATH AND CORONARY ANGIOGRAPHY;  Surgeon: Wellington Hampshire, MD;  Location: [place] [facility];  Service: Cardiovascular;  Laterality: N/A;    Current Medications: No outpatient medications have been marked as taking for the 06/22/19 encounter (Appointment) with Sueanne Margarita, MD.     Allergies:   Acetaminophen   Social History   Socioeconomic History  . Marital status: Married    Spouse name: Amethyst  . Number of children: 8  . Years of education: 12  . Highest education level: Some college, no degree  Occupational History    Comment: disabled  Social Needs  . Financial resource strain: Hard  . Food insecurity    Worry: Often true    Inability: Often true  . Transportation needs    Medical: Yes    Non-medical: Yes  Tobacco Use  . Smoking status: Former Smoker    Packs/day: 0.50    Years: 10.00    Pack years: 5.00    Types: Cigarettes    Quit date: 04/11/2008    Years since quitting: 11.2  . Smokeless tobacco: Never Used  Substance and Sexual Activity  . Alcohol use: No    Alcohol/week: 0.0 standard drinks  . Drug use: No  . Sexual activity: Yes    Partners: Female    Birth control/protection: None  Lifestyle  . Physical activity    Days per week: Not on file    Minutes per session: Not on file  . Stress: Not on file  Relationships  . Social Herbalist on phone: Not on file    Gets together: Not on file    Attends religious service: Not on file    Active member of club or organization: Not on file    Attends meetings of clubs or organizations: Not on file    Relationship status: Not on file  Other Topics Concern  . Not on file  Social History Narrative   Lives with wife. Does not work.  On disability.    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 Provided crisis assistance program information to assist with bills       Has issues getting food-  gets $36/month in food stamps- already has list of food pantries but has not tried them- encouraged pt to try food pantries and reach out to clinic for help if needed     Family History: The patient's family history includes COPD in his father and mother; Diabetes in his father and mother; Heart Problems in his father; Hypertension in his father and mother; Lung cancer in his mother. There is no history of Colon cancer or Stomach cancer.  ROS:   Please see the history of present illness.    ROS  All other systems reviewed and negative.   EKGs/Labs/Other Studies Reviewed:    The following studies were reviewed today: none  EKG:  EKG is  ordered today.  The ekg ordered today demonstrates ***  Recent  Labs: 07/06/2018: NT-Pro BNP 624 08/31/2018: Magnesium 2.3 05/17/2019: ALT 21; BUN 17; Creatinine, Ser 1.58; Hemoglobin 13.3; Platelets 227; Potassium 3.7; Sodium 142   Recent Lipid Panel    Component Value Date/Time   CHOL 126 04/22/2019 1121   TRIG 134 04/22/2019 1121   HDL 32 (L) 04/22/2019 1121   CHOLHDL 3.9 04/22/2019 1121   CHOLHDL 2.6 06/10/2016 1253   VLDL 18 06/10/2016 1253   LDLCALC 70 04/22/2019 1121    Physical Exam:    VS:  There were no vitals taken for this visit.    Wt Readings from Last 3 Encounters:  06/17/19 242 lb 6.4 oz (110 kg)  05/06/19 246 lb 3.2 oz (111.7 kg)  05/02/19 246 lb (111.6 kg)     GEN:  Well nourished, well developed in no acute distress HEENT: Normal NECK: No JVD; No carotid bruits LYMPHATICS: No lymphadenopathy CARDIAC: RRR, no murmurs, rubs, gallops RESPIRATORY:  Clear to auscultation without rales, wheezing or rhonchi  ABDOMEN: Soft, non-tender, non-distended MUSCULOSKELETAL:  No edema; No deformity  SKIN: Warm and dry NEUROLOGIC:  Alert and oriented x 3 PSYCHIATRIC:  Normal affect   ASSESSMENT:    1. Chronic combined systolic and diastolic CHF (congestive heart failure) ([place])   2. Cardiac amyloidosis ([place])   3. Coronary  artery disease involving native coronary artery of native heart without angina pectoris   4. Stage 3a chronic kidney disease   5. Pure hypercholesterolemia   6. Paroxysmal atrial fibrillation (HCC)   7. Type 2 diabetes mellitus with stage 3 chronic kidney disease, without long-term current use of insulin, unspecified whether stage 3a or 3b CKD ([place])    PLAN:    In order of problems listed above:  1.  Chronic combined systolic/diastolic CHF -echo [id number] with EF 40-45% and G2DD. -PYP scan + for Cardiac amyloidosis but MGUS negative -NYHA class 2b -does not appear volume overloaded on exam today. -Unclear etiology and [name] unsure of validity of his PYP scan -cannot get cMRI due to CKD -continue Entresto 97-103mg  BID, Carvedilol 12.5mg  BID, Hydralazine 50mg  TID, Imdur 30mg  daily and Lasix 40mg  BID -Continue fluid restriction to < 2L daily and Na restriction to < 2gm daily  2.  Cardiac amyloidosis -PYP scan 06/29/18. Grade 2, H/CLL equal 1.94. See discussion above -Genetic testing negative -unclear dx -now on Tafamadis -was supposed to have repeat PYP scan in August but this was not done - I will order this -cannot get cMRI due to CKD -needs to followup win AHF clinic which we will arrange  3.  Nonobstructive CAD -Cath 2019 showed 35% D2, 20% pLAD -denies any angina -continue ASA and statin  4.  CKD stage 3a -creatinine stable  5.  HLD -LDL goal < 70 -continue atorvastatin 40mg  daily  6.  PAF -s/p remote RFCA in 2017 -denies any palpitations -had been on Xarelto but stopped due to anemia - 7.  Type 2 DM -followed by PCP -consider addition of Jardiance given hx of CHF  8.     Medication Adjustments/Labs and Tests Ordered: Current medicines are reviewed at length with the patient today.  Concerns regarding medicines are outlined above.  No orders of the defined types were placed in this encounter.  No orders of the defined types were placed in this  encounter.   Signed, [name], MD  06/22/2019 8:35 AM    [place]

## 2019-06-22 NOTE — Telephone Encounter (Signed)

## 2019-06-23 ENCOUNTER — Telehealth: Payer: Self-pay | Admitting: Internal Medicine

## 2019-06-23 MED ORDER — DOXYCYCLINE HYCLATE 100 MG PO TABS: 100.0000 mg | ORAL_TABLET | Freq: Two times a day (BID) | ORAL | 0 refills | Status: DC

## 2019-06-23 NOTE — Telephone Encounter (Signed)
Contacted pt and left a detailed vm informing pt of Dr. Johnson response and if he has any questions or concerns to give me a call  

## 2019-06-23 NOTE — Telephone Encounter (Signed)
Will forward to pcp

## 2019-06-23 NOTE — Telephone Encounter (Signed)
Patient called stating he was prescribed amoxicillin-clavulanate (AUGMENTIN) 500-125 MG tablet and patient states that the medication is making him vomit and have diarrhea. Please f/u

## 2019-07-18 ENCOUNTER — Ambulatory Visit: Payer: Self-pay | Admitting: Diagnostic Neuroimaging

## 2019-07-18 ENCOUNTER — Encounter: Payer: Self-pay | Admitting: Diagnostic Neuroimaging

## 2019-07-19 ENCOUNTER — Telehealth: Payer: Self-pay

## 2019-07-19 NOTE — Telephone Encounter (Signed)
PT cancel appt same day on 07/18/2019.

## 2019-07-27 NOTE — Progress Notes (Signed)
Virtual Visit via Video Note   This visit type was conducted due to national recommendations for restrictions regarding the COVID-19 Pandemic (e.g. social distancing) in an effort to limit this patient's exposure and mitigate transmission in our community.  Due to his co-morbid illnesses, this patient is at least at moderate risk for complications without adequate follow up.  This format is felt to be most appropriate for this patient at this time.  All issues noted in this document were discussed and addressed.  A limited physical exam was performed with this format.  Please refer to the patient's chart for his consent to telehealth for [facility].  Evaluation Performed:  Follow-up visit  This visit type was conducted due to national recommendations for restrictions regarding the COVID-19 Pandemic (e.g. social distancing).  This format is felt to be most appropriate for this patient at this time.  All issues noted in this document were discussed and addressed.  No physical exam was performed (except for noted visual exam findings with Video Visits).  Please refer to the patient's chart (MyChart message for video visits and phone note for telephone visits) for the patient's consent to telehealth for [facility]  Date:  07/28/2019   ID:  Marco [name], DOB [birth year], MRN [id number]  Patient Location:  Home  Provider location:   [place]  PCP:  [name], MD  Cardiologist:  [name], MD  Electrophysiologist:  None   Chief Complaint:  OSA, Afib, CHF, HTN, HLD  History of Present Illness:    Marco [name] is a 63 y.o. male who presents via audio/video conferencing for a telehealth visit today.    Marco [name] is a 63 y.o. male with a hx of combined systolic and diastolic heart failure, nonischemic cardiomyopathy, mild nonobstructive coronary artery disease by cardiac catheterization in 2017, persistent atrial fibrillation with prior history of ablation in [place]  in 2017, diabetes, hypertension, chronic kidney disease, sleep apnea, obesity and OSA on CPAP.   His EF was 45-50 but improved to normal by Echo in 01/2017.CHADS2-VASc=4 (CHF, DM, CAD, HTN).Anticoagulation for PAF has been discontinued in the past secondary to anemia. He has a history of chronic dyspnea and chronic chest discomfort. Evaluation by pulmonology included a cardiopulmonary stress test in 2017. However, this was not conclusive due to submaximal effort.He was seen in August 2019 for worsening shortness of breath. An echocardiogram in September 2019 demonstrated worsening LV function with an EF of 35-40%.   Coronary CTA was planned however, the patient was admitted in October 2019 with chest pain and mildly elevated troponin. Echocardiogram demonstrated EF 25-30%. Cardiac catheterization demonstrated mild plaque without obstructive disease. CHF medications were adjusted for nonischemic cardiomyopathy.   He was evaluated by [name] in the advanced heart failure clinic in October 2019. PYP scan was strongly suggestive of transthyretin amyloidosis (grade 2, H/CLL equal 1.94) .  After discussion with [name] with Hematology he felt he did not have myeloma.  Genetic testing negative and myeloma panel with MUGUS but no myeloma.  Now on Tafamadis and followed by AHF clinic. He has been on Entresto, Lasix, Carvedilol, Hydralazine and Imdur.    He is here today for followup and is doing well.  He denies any anginal chest pain or pressure, PND, orthopnea, LE edema, dizziness, palpitations.  He had a syncopal episode the other week related to hypoglycemia with a BS of 35.    He has minimal DOE at times but very infrequent.  He is compliant  with his meds and is tolerating meds with no SE.    He is doing well with his CPAP device and thinks that he has gotten used to it.  He tolerates the mask and feels the pressure is adequate.  Since going on CPAP he feels rested in the am  and has no significant daytime sleepiness.  He denies any significant mouth or nasal dryness or nasal congestion.  He does not think that he snores.    The patient does not have symptoms concerning for COVID-19 infection (fever, chills, cough, or new shortness of breath).   Prior CV studies:   The following studies were reviewed today:  lab  Past Medical History:  Diagnosis Date  . Benign colon polyp 08/01/2013   [facility] in [place]. large base tranverse colon polyp was biopsied. polyp was benign with minimal surface hyperplastic change.  . Chronic combined systolic and diastolic CHF (congestive heart failure) ([place])   . Chronic pain   . CKD (chronic kidney disease), stage III   . Diabetes mellitus without complication ([place])   . Diverticulosis   . Esophageal hiatal hernia 07/29/2013   confirmed on EGD   . Esophageal stricture   . Essential hypertension   . Gastritis 07/29/2013   confirmed on EGD, bx done an negative for intestinal metaplasia, dsyplasia or H. pylori. normal gastric emptying study done 07/13/2013.  [name] GERD (gastroesophageal reflux disease)   . Morbid obesity ([place])   . Non-obstructive CAD    a. 02/2013 Cath ([place]): nonobs dzs;  b. 09/2014 Myoview ([place]): EF 55%, no ischemia;  c. Cath 05/2018 mild CAD no obstruction.  . Persistent atrial fibrillation ([place])    a. 02/2013 s/p rfca in [place], [place]-->prev on Xarelto, d/c'd 2/2 anemia, ? GIB.  [name] Rheumatoid arthritis ([place])   . Sigmoid diverticulosis 08/01/2013   confirmed on colonscopy. record scanned into chart   Past Surgical History:  Procedure Laterality Date  . CARDIAC CATHETERIZATION  05/2014   ablation for atrial fibrillation  . CARDIAC CATHETERIZATION N/A 11/16/2015   Procedure: Left Heart Cath and Coronary Angiography;  Surgeon: Leonie Man, MD;  Location: [place] CV [facility];  Service: Cardiovascular;  Laterality: N/A;  . COLON RESECTION  09/2013   due to large, abnormal polpy. non cancerous per  patient.   . COLON SURGERY  09/2014   colon resection   . LEFT HEART CATH AND CORONARY ANGIOGRAPHY N/A 05/19/2018   Procedure: LEFT HEART CATH AND CORONARY ANGIOGRAPHY;  Surgeon: Wellington Hampshire, MD;  Location: [place] [facility];  Service: Cardiovascular;  Laterality: N/A;     Current Meds  Medication Sig  . Accu-Chek FastClix Lancets MISC Use one strip to monitor glucose levels BID; E11.42  . albuterol (PROVENTIL HFA;VENTOLIN HFA) 108 (90 Base) MCG/ACT inhaler Inhale 2 puffs into the lungs every 6 (six) hours as needed for wheezing or shortness of breath.  . allopurinol (ZYLOPRIM) 100 MG tablet Take 100 mg by mouth daily.  [name] atorvastatin (LIPITOR) 40 MG tablet Take 1 tablet (40 mg total) by mouth daily.  . carvedilol (COREG) 12.5 MG tablet Take 1 tablet (12.5 mg total) by mouth 2 (two) times daily with a meal.  . diazepam (VALIUM) 2 MG tablet 1/2  tab PO 1/2 hr prior to MRI.  If needed, you can take the other 1/2 tab  . doxycycline (VIBRA-TABS) 100 MG tablet Take 1 tablet (100 mg total) by mouth 2 (two) times daily.  . DULoxetine (CYMBALTA) 20  MG capsule Take 1 capsule by mouth once daily  . EQ ASPIRIN ADULT LOW DOSE 81 MG EC tablet TAKE 1 TABLET BY MOUTH ONCE DAILY. SWALLOW WHOLE  . famotidine (PEPCID) 20 MG tablet Take 1 tablet (20 mg total) by mouth 2 (two) times daily.  [name] glucose blood (ACCU-CHEK GUIDE) test strip Use one strip to monitor glucose levels BID; E11.42  . hydrALAZINE (APRESOLINE) 50 MG tablet Take 1 tablet (50 mg total) by mouth every 8 (eight) hours.  [name] HYDROcodone-acetaminophen (NORCO) 10-325 MG tablet Take 1 tablet by mouth every 8 (eight) hours.  . Insulin Lispro Prot & Lispro (HUMALOG MIX 75/25 KWIKPEN) (75-25) 100 UNIT/ML Kwikpen Inject 160 Units into the skin daily with breakfast.  . Insulin Pen Needle (PEN NEEDLES) 31G X 6 MM MISC 2 Devices by Does not apply route daily.  . isosorbide mononitrate (IMDUR) 30 MG 24 hr tablet Take 1 tablet by mouth once daily  .  Potassium Chloride ER 20 MEQ TBCR Take 20 mEq by mouth daily.  . pregabalin (LYRICA) 100 MG capsule Take 1 capsule (100 mg total) by mouth 3 (three) times daily. (Patient taking differently: Take 100 mg by mouth 2 (two) times daily. )  . sacubitril-valsartan (ENTRESTO) 97-103 MG Take 1 tablet by mouth 2 (two) times daily.  . Secukinumab (COSENTYX) 150 MG/ML SOSY Inject 150 mg into the skin every 28 (twenty-eight) days.   . Tafamidis (VYNDAMAX) 61 MG CAPS Take 61 mg by mouth daily.  [name] zolpidem (AMBIEN CR) 6.25 MG CR tablet Take 1 tablet (6.25 mg total) by mouth at bedtime as needed. for sleep.  To fill on or after 06/19/2019. Each prescription to last 1 month     Allergies:   Acetaminophen   Social History   Tobacco Use  . Smoking status: Former Smoker    Packs/day: 0.50    Years: 10.00    Pack years: 5.00    Types: Cigarettes    Quit date: 04/11/2008    Years since quitting: 11.3  . Smokeless tobacco: Never Used  Substance Use Topics  . Alcohol use: No    Alcohol/week: 0.0 standard drinks  . Drug use: No     Family Hx: The patient's family history includes COPD in his father and mother; Diabetes in his father and mother; Heart Problems in his father; Hypertension in his father and mother; Lung cancer in his mother. There is no history of Colon cancer or Stomach cancer.  ROS:   Please see the history of present illness.     All other systems reviewed and are negative.   Labs/Other Tests and Data Reviewed:    Recent Labs: 08/31/2018: Magnesium 2.3 05/17/2019: ALT 21; BUN 17; Creatinine, Ser 1.58; Hemoglobin 13.3; Platelets 227; Potassium 3.7; Sodium 142   Recent Lipid Panel Lab Results  Component Value Date/Time   CHOL 126 04/22/2019 11:21 AM   TRIG 134 04/22/2019 11:21 AM   HDL 32 (L) 04/22/2019 11:21 AM   CHOLHDL 3.9 04/22/2019 11:21 AM   CHOLHDL 2.6 06/10/2016 12:53 PM   LDLCALC 70 04/22/2019 11:21 AM    Wt Readings from Last 3 Encounters:  07/28/19 244 lb (110.7 kg)   06/17/19 242 lb 6.4 oz (110 kg)  05/06/19 246 lb 3.2 oz (111.7 kg)     Objective:    Vital Signs:  Ht 5\' 6"  (1.676 m)   Wt 244 lb (110.7 kg)   BMI 39.38 kg/m    CONSTITUTIONAL:  Well nourished, well developed male in  no acute distress.  EYES: anicteric MOUTH: oral mucosa is pink RESPIRATORY: Normal respiratory effort, symmetric expansion CARDIOVASCULAR: No peripheral edema SKIN: No rash, lesions or ulcers MUSCULOSKELETAL: no digital cyanosis NEURO: Cranial Nerves II-XII grossly intact, moves all extremities PSYCH: Intact judgement and insight.  A&O x 3, Mood/affect appropriate   ASSESSMENT & PLAN:    1.  OSA - The patient is tolerating PAP therapy well without any problems.  The patient has been using and benefiting from PAP use and will continue to benefit from therapy. -I will get a download from his DME  2.  HTN -BP controlled -continue Hydralazine, carvedilol and Entresto  3.  Chronic combined systolic/diastolic CHF -Echo [id number] LVEF 25-30%, Grade 1 DD, Trivial MR.  -Echo 09/2018 showed  EF ~40%  -No indication for ICD currently -NYHA class 2a symptoms -his weight is stable at 240-246lbs -denies any significant SOB or LE edema -continue Hydralazine 50mg  q8hs, Carvedilol 12.5mg  BID, Imdur 30mg  daily, Entresto 97-103mg  BID and Lasix 40mg  BID -creatinine stable at 1.58 in October -reviewed again < 2gm Na diet and fluid restrict to < 2L and daily weights  4. ASCAD -mild non obstructive dz on cath 2017 -no anginal CP - he had a URI recently and had some pleuritic CP but no exertional CP which improved with antibx.  He had a lot of mucous.  -continue ASA, BB and long acting nitrate  5.  NICM -nonischemic with EF 25-30% by echo 05/2018.   -PYP scan  positive for TTR amyloid (grade 2, H/CLL equal 1.94) . -cMRI contraindicated due to CKD.  -SPEP c/w  Renal dz and MUGUS but no Myeloma.   -[name] wanted repeat PYP scan in August that was not done so I will get  this scheduled.  -followup with [name] after PYP scan  6.  CKD stage 3a -creatinine 1.58 in October and stable  7.  PAF -s/p RFCA 2017 -denies any palpitations -previously on Xarelto but stopped due to anemia and unknown blood loss -Hbg was 13 in October -discussed instituting Eliquis 5mg  BID due to [facility] score of 4 - he wants to think about for a while  8.  HLD -LDL goal < 70 -LDL was 70 in Sept -continue atorvastatin    COVID-19 Education: The signs and symptoms of COVID-19 were discussed with the patient and how to seek care for testing (follow up with PCP or arrange E-visit).  The importance of social distancing was discussed today.  Patient Risk:   After full review of this patient's clinical status, I feel that they are at least moderate risk at this time.  Time:   Today, I have spent 20 minutes directly with the patient on telemedicine discussing medical problems including OSA, CAD, HTN, CHF, DCM, PAF.  We also reviewed the symptoms of COVID 19 and the ways to protect against contracting the virus with telehealth technology.  I spent an additional 5 minutes reviewing patient's chart including PAP compliance download, sleep study and labs.  Medication Adjustments/Labs and Tests Ordered: Current medicines are reviewed at length with the patient today.  Concerns regarding medicines are outlined above.  Tests Ordered: No orders of the defined types were placed in this encounter.  Medication Changes: No orders of the defined types were placed in this encounter.   Disposition:  Follow up in 6 month(s)  Signed, [name], MD  07/28/2019 9:35 AM    [facility]

## 2019-07-28 ENCOUNTER — Other Ambulatory Visit: Payer: Self-pay

## 2019-07-28 ENCOUNTER — Telehealth (INDEPENDENT_AMBULATORY_CARE_PROVIDER_SITE_OTHER): Payer: Medicaid Other | Admitting: Cardiology

## 2019-07-28 VITALS — Ht 66.0 in | Wt 244.0 lb

## 2019-07-28 DIAGNOSIS — Z7982 Long term (current) use of aspirin: Secondary | ICD-10-CM

## 2019-07-28 DIAGNOSIS — E7800 Pure hypercholesterolemia, unspecified: Secondary | ICD-10-CM

## 2019-07-28 DIAGNOSIS — Z79899 Other long term (current) drug therapy: Secondary | ICD-10-CM

## 2019-07-28 DIAGNOSIS — I5042 Chronic combined systolic (congestive) and diastolic (congestive) heart failure: Secondary | ICD-10-CM | POA: Diagnosis not present

## 2019-07-28 DIAGNOSIS — I130 Hypertensive heart and chronic kidney disease with heart failure and stage 1 through stage 4 chronic kidney disease, or unspecified chronic kidney disease: Secondary | ICD-10-CM

## 2019-07-28 DIAGNOSIS — I428 Other cardiomyopathies: Secondary | ICD-10-CM

## 2019-07-28 DIAGNOSIS — N1831 Chronic kidney disease, stage 3a: Secondary | ICD-10-CM | POA: Diagnosis not present

## 2019-07-28 DIAGNOSIS — G4733 Obstructive sleep apnea (adult) (pediatric): Secondary | ICD-10-CM | POA: Diagnosis not present

## 2019-07-28 DIAGNOSIS — I480 Paroxysmal atrial fibrillation: Secondary | ICD-10-CM

## 2019-07-28 DIAGNOSIS — I2510 Atherosclerotic heart disease of native coronary artery without angina pectoris: Secondary | ICD-10-CM

## 2019-07-28 DIAGNOSIS — E785 Hyperlipidemia, unspecified: Secondary | ICD-10-CM

## 2019-07-28 DIAGNOSIS — I10 Essential (primary) hypertension: Secondary | ICD-10-CM

## 2019-07-28 NOTE — Patient Instructions (Addendum)
Medication Instructions:  Your physician recommends that you continue on your current medications as directed. Please refer to the Current Medication list given to you today.  *If you need a refill on your cardiac medications before your next appointment, please call your pharmacy*  Lab Work: none If you have labs (blood work) drawn today and your tests are completely normal, you will receive your results only by: [name] MyChart Message (if you have MyChart) OR . A paper copy in the mail If you have any lab test that is abnormal or we need to change your treatment, we will call you to review the results.  Testing/Procedure  Our office will contact you to schedule a PYP scan. --There are no restrictions for this test.  It will take about 1.5-2 hours.   Follow-Up: At [facility], you and your health needs are our priority.  As part of our continuing mission to provide you with exceptional heart care, we have created designated Provider Care Teams.  These Care Teams include your primary Cardiologist (physician) and Advanced Practice Providers (APPs -  Physician Assistants and Nurse Practitioners) who all work together to provide you with the care you need, when you need it.  Your next appointment:   6 month(s)  The format for your next appointment:   Either In Person or Virtual  Provider:  Dr [name]   Other Instructions  Follow up with Dr [name] in 6 months.  You will need an appointment with Dr [name] after the PYP scan.

## 2019-07-29 ENCOUNTER — Other Ambulatory Visit: Payer: Self-pay

## 2019-07-29 ENCOUNTER — Ambulatory Visit (INDEPENDENT_AMBULATORY_CARE_PROVIDER_SITE_OTHER): Payer: Medicaid Other | Admitting: Endocrinology

## 2019-07-29 ENCOUNTER — Encounter: Payer: Self-pay | Admitting: Endocrinology

## 2019-07-29 VITALS — BP 110/60 | HR 80 | Ht 66.0 in | Wt 246.0 lb

## 2019-07-29 DIAGNOSIS — E1142 Type 2 diabetes mellitus with diabetic polyneuropathy: Secondary | ICD-10-CM

## 2019-07-29 DIAGNOSIS — E1140 Type 2 diabetes mellitus with diabetic neuropathy, unspecified: Secondary | ICD-10-CM | POA: Diagnosis not present

## 2019-07-29 DIAGNOSIS — E1165 Type 2 diabetes mellitus with hyperglycemia: Secondary | ICD-10-CM | POA: Diagnosis not present

## 2019-07-29 DIAGNOSIS — IMO0002 Reserved for concepts with insufficient information to code with codable children: Secondary | ICD-10-CM

## 2019-07-29 LAB — POCT GLYCOSYLATED HEMOGLOBIN (HGB A1C): Hemoglobin A1C: 7.9 % — AB (ref 4.0–5.6)

## 2019-07-29 MED ORDER — INSULIN LISPRO PROT & LISPRO (75-25 MIX) 100 UNIT/ML KWIKPEN: 150.0000 [IU] | PEN_INJECTOR | Freq: Every day | SUBCUTANEOUS | 11 refills | Status: DC

## 2019-07-29 NOTE — Patient Instructions (Addendum)
check your blood sugar twice a day.  vary the time of day when you check, between before the 3 meals, and at bedtime.  also check if you have symptoms of your blood sugar being too high or too low.  please keep a record of the readings and bring it to your next appointment here (or you can bring the meter itself).  You can write it on any piece of paper.  please call us sooner if your blood sugar goes below 70, or if you have a lot of readings over 200.   Please reduce the insulin to 150 units with breakfast    On this type of insulin schedule, you should eat meals on a regular schedule (especially lunch).  If a meal is missed or significantly delayed, your blood sugar could go low.   Please see a pain specialist.  you will receive a phone call, about a day and time for an appointment Please come back for a follow-up appointment in 2 months.

## 2019-07-29 NOTE — Progress Notes (Signed)
Subjective:    Patient ID: Marco [name], male    DOB: 1956-03-30, 63 y.o.   MRN: [id number]  HPI Pt returns for f/u of diabetes mellitus: DM type: Insulin-requiring type 2 Dx'ed: [id number] Complications: polyneuropathy, CAD, NPDR, and renal failure.  Therapy: insulin since soon after dx DKA: never Severe hypoglycemia: once (2020) Pancreatitis: never.  Pancreatic imaging: never.  Other: he chronically takes prednisone, for psoriasis, with no plan for taper; he declined to continue multiple daily injections; due to renal failure (and fasting hypoglycemia), he takes 75/25 insulin.   Interval history: no cbg record, but states cbg's vary from 35-300.  It is in general lowest in the afternoon.  1 month ago, he had severe hypoglycemia in the afternoon.  pt says he never misses the insulin.  pt states he otherwise feels well in general.  No change in chronic prednisone dosage (5 mg qd).  He requests to see pain specialist.   Past Medical History:  Diagnosis Date  . Benign colon polyp 08/01/2013   [facility] in [place]. large base tranverse colon polyp was biopsied. polyp was benign with minimal surface hyperplastic change.  . Chronic combined systolic and diastolic CHF (congestive heart failure) ([place])   . Chronic pain   . CKD (chronic kidney disease), stage III   . Diabetes mellitus without complication ([place])   . Diverticulosis   . Esophageal hiatal hernia 07/29/2013   confirmed on EGD   . Esophageal stricture   . Essential hypertension   . Gastritis 07/29/2013   confirmed on EGD, bx done an negative for intestinal metaplasia, dsyplasia or H. pylori. normal gastric emptying study done 07/13/2013.  [name] GERD (gastroesophageal reflux disease)   . Morbid obesity ([place])   . Non-obstructive CAD    a. 02/2013 Cath ([place]): nonobs dzs;  b. 09/2014 Myoview ([place]): EF 55%, no ischemia;  c. Cath 05/2018 mild CAD no obstruction.  . Persistent atrial fibrillation ([place])    a. 02/2013 s/p rfca in [place], [place]-->prev on Xarelto, d/c'd 2/2 anemia, ? GIB.  [name] Rheumatoid arthritis ([place])   . Sigmoid diverticulosis 08/01/2013   confirmed on colonscopy. record scanned into chart    Past Surgical History:  Procedure Laterality Date  . CARDIAC CATHETERIZATION  05/2014   ablation for atrial fibrillation  . CARDIAC CATHETERIZATION N/A 11/16/2015   Procedure: Left Heart Cath and Coronary Angiography;  Surgeon: Leonie Man, MD;  Location: [facility];  Service: Cardiovascular;  Laterality: N/A;  . COLON RESECTION  09/2013   due to large, abnormal polpy. non cancerous per patient.   . COLON SURGERY  09/2014   colon resection   . LEFT HEART CATH AND CORONARY ANGIOGRAPHY N/A 05/19/2018   Procedure: LEFT HEART CATH AND CORONARY ANGIOGRAPHY;  Surgeon: Wellington Hampshire, MD;  Location: [facility];  Service: Cardiovascular;  Laterality: N/A;    Social History   Socioeconomic History  . Marital status: Married    Spouse name: Amethyst  . Number of children: 8  . Years of education: 43  . Highest education level: Some college, no degree  Occupational History    Comment: disabled  Tobacco Use  . Smoking status: Former Smoker    Packs/day: 0.50    Years: 10.00    Pack years: 5.00    Types: Cigarettes    Quit date: 04/11/2008    Years since quitting: 11.3  . Smokeless tobacco: Never Used  Substance and Sexual Activity  . Alcohol  use: No    Alcohol/week: 0.0 standard drinks  . Drug use: No  . Sexual activity: Yes    Partners: Female    Birth control/protection: None  Other Topics Concern  . Not on file  Social History Narrative   Lives with wife. Does not work.  On disability.    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 Provided crisis assistance program information to assist with bills        Has issues getting food- gets $36/month in food stamps- already has list of food pantries but has not tried them- encouraged pt to try food pantries and reach out to clinic for help if needed   Social Determinants of Health   Financial Resource Strain:   . Difficulty of Paying Living Expenses: Not on file  Food Insecurity:   . Worried About Charity fundraiser in the Last Year: Not on file  . Ran Out of Food in the Last Year: Not on file  Transportation Needs:   . Lack of Transportation (Medical): Not on file  . Lack of Transportation (Non-Medical): Not on file  Physical Activity:   . Days of Exercise per Week: Not on file  . Minutes of Exercise per Session: Not on file  Stress:   . Feeling of Stress : Not on file  Social Connections:   . Frequency of Communication with Friends and Family: Not on file  . Frequency of Social Gatherings with Friends and Family: Not on file  . Attends Religious Services: Not on file  . Active Member of Clubs or Organizations: Not on file  . Attends Archivist Meetings: Not on file  . Marital Status: Not on file  Intimate Partner Violence:   . Fear of Current or Ex-Partner: Not on file  . Emotionally Abused: Not on file  . Physically Abused: Not on file  . Sexually Abused: Not on file    Current Outpatient Medications on File Prior to Visit  Medication Sig Dispense Refill  . Accu-Chek FastClix Lancets MISC Use one strip to monitor glucose levels BID; E11.42 102 each 11  . albuterol (PROVENTIL HFA;VENTOLIN HFA) 108 (90 Base) MCG/ACT inhaler Inhale 2 puffs into the lungs every 6 (six) hours as needed for wheezing or shortness of breath. 1 Inhaler 0  . allopurinol (ZYLOPRIM) 100 MG tablet Take 100 mg by mouth daily.    [name] atorvastatin (LIPITOR) 40 MG tablet Take 1 tablet (40 mg total) by mouth daily. (Patient taking differently: Take 80 mg by mouth daily. ) 90 tablet 3  . carvedilol (COREG) 12.5 MG tablet Take 1 tablet (12.5 mg total) by mouth  2 (two) times daily with a meal. 180 tablet 0  . diazepam (VALIUM) 2 MG tablet 1/2  tab PO 1/2 hr prior to MRI.  If needed, you can take the other 1/2 tab 1 tablet 0  . DULoxetine (CYMBALTA) 20 MG capsule Take 1 capsule by mouth once daily 90 capsule 1  . famotidine (PEPCID) 20 MG tablet Take 1 tablet (20 mg total) by mouth 2 (two) times daily. 60 tablet 6  . glucose blood (ACCU-CHEK GUIDE) test strip Use one strip to monitor glucose levels BID; E11.42 100 each 12  . hydrALAZINE (APRESOLINE) 50 MG tablet Take 1 tablet (50 mg  total) by mouth every 8 (eight) hours. 90 tablet 4  . HYDROcodone-acetaminophen (NORCO) 10-325 MG tablet Take 1 tablet by mouth every 8 (eight) hours.    . Insulin Pen Needle (PEN NEEDLES) 31G X 6 MM MISC 2 Devices by Does not apply route daily. 180 each 3  . isosorbide mononitrate (IMDUR) 30 MG 24 hr tablet Take 1 tablet by mouth once daily 90 tablet 1  . pregabalin (LYRICA) 100 MG capsule Take 1 capsule (100 mg total) by mouth 3 (three) times daily. (Patient taking differently: Take 100 mg by mouth 2 (two) times daily. ) 90 capsule 3  . sacubitril-valsartan (ENTRESTO) 97-103 MG Take 1 tablet by mouth 2 (two) times daily. 60 tablet 0  . Secukinumab (COSENTYX) 150 MG/ML SOSY Inject 150 mg into the skin every 28 (twenty-eight) days.     . Tafamidis (VYNDAMAX) 61 MG CAPS Take 61 mg by mouth daily. 30 capsule 11  . zolpidem (AMBIEN CR) 6.25 MG CR tablet Take 1 tablet (6.25 mg total) by mouth at bedtime as needed. for sleep.  To fill on or after 06/19/2019. Each prescription to last 1 month 30 tablet 2  . furosemide (LASIX) 40 MG tablet Take 1 tablet (40 mg total) by mouth 2 (two) times daily. 180 tablet 3  . Potassium Chloride ER 20 MEQ TBCR Take 20 mEq by mouth daily. 90 tablet 3   No current facility-administered medications on file prior to visit.    Allergies  Allergen Reactions  . Acetaminophen     Family History  Problem Relation Age of Onset  . Hypertension Mother     . Diabetes Mother   . COPD Mother   . Lung cancer Mother        Smoker   . Hypertension Father   . Diabetes Father   . Heart Problems Father   . COPD Father   . Colon cancer Neg Hx   . Stomach cancer Neg Hx     BP 110/60 (BP Location: Right Arm, Patient Position: Sitting, Cuff Size: Large)   Pulse 80   Ht 5\' 6"  (1.676 m)   Wt 246 lb (111.6 kg)   SpO2 93%   BMI 39.71 kg/m    Review of Systems No weight change.      Objective:   Physical Exam VITAL SIGNS:  See vs page GENERAL: no distress Pulses: dorsalis pedis intact bilat.   MSK: no deformity of the feet CV: 1+ bilat leg edema Skin:  no ulcer on the feet.  normal color and temp on the feet. Neuro: sensation is intact to touch on the feet, but decreased from normal  Lab Results  Component Value Date   HGBA1C 7.9 (A) 07/29/2019   Lab Results  Component Value Date   CREATININE 1.58 (H) 05/17/2019   BUN 17 05/17/2019   NA 142 05/17/2019   K 3.7 05/17/2019   CL 101 05/17/2019   CO2 30 05/17/2019       Assessment & Plan:  PPN: persistent Insulin-requiring type 2 DM, with renal failure: Hypoglycemia: this also limits aggressiveness of glycemic control  Patient Instructions  check your blood sugar twice a day.  vary the time of day when you check, between before the 3 meals, and at bedtime.  also check if you have symptoms of your blood sugar being too high or too low.  please keep a record of the readings and bring it to your next appointment here (or you can bring the meter itself).  You can write it on any piece of paper.  please call us sooner if your blood sugar goes below 70, or if you have a lot of readings over 200.   Please reduce the insulin to 150 units with breakfast    On this type of insulin schedule, you should eat meals on a regular schedule (especially lunch).  If a meal is missed or significantly delayed, your blood sugar could go low.   Please see a pain specialist.  you will receive a phone  call, about a day and time for an appointment Please come back for a follow-up appointment in 2 months.

## 2019-07-31 ENCOUNTER — Other Ambulatory Visit: Payer: Self-pay | Admitting: Internal Medicine

## 2019-07-31 DIAGNOSIS — I2510 Atherosclerotic heart disease of native coronary artery without angina pectoris: Secondary | ICD-10-CM

## 2019-08-03 ENCOUNTER — Ambulatory Visit (HOSPITAL_COMMUNITY): Payer: Medicaid Other | Attending: Cardiology

## 2019-08-03 ENCOUNTER — Other Ambulatory Visit: Payer: Self-pay

## 2019-08-03 DIAGNOSIS — I428 Other cardiomyopathies: Secondary | ICD-10-CM

## 2019-08-03 MED ORDER — TECHNETIUM TC 99M PYROPHOSPHATE
21.7000 | Freq: Once | INTRAVENOUS | Status: AC
  Administered 2019-08-03: 21.7 via INTRAVENOUS

## 2019-08-03 NOTE — Telephone Encounter (Signed)
error 

## 2019-08-08 ENCOUNTER — Ambulatory Visit: Payer: Medicaid Other | Admitting: Internal Medicine

## 2019-08-09 ENCOUNTER — Telehealth (HOSPITAL_COMMUNITY): Payer: Self-pay | Admitting: Pharmacy Technician

## 2019-08-09 NOTE — Telephone Encounter (Signed)
Called to touch base with patient on switching his prescriptions for Vyndamax and Entresto over to [employer]. He is aware that we are gong to make this change to ship his medications straight to him and any other medications that he may want to fill and have shipped.  Called and left him a voicemail to call me back.  Charlann Boxer, CPhT

## 2019-08-15 ENCOUNTER — Other Ambulatory Visit: Payer: Self-pay

## 2019-08-15 ENCOUNTER — Encounter: Payer: Self-pay | Admitting: Diagnostic Neuroimaging

## 2019-08-15 ENCOUNTER — Ambulatory Visit: Payer: Medicaid Other | Admitting: Diagnostic Neuroimaging

## 2019-08-15 VITALS — BP 116/63 | HR 68 | Temp 98.1°F | Ht 66.0 in | Wt 246.0 lb

## 2019-08-15 DIAGNOSIS — E162 Hypoglycemia, unspecified: Secondary | ICD-10-CM

## 2019-08-15 NOTE — Progress Notes (Signed)
[facility]  PATIENT: Marco [name] DOB: 01-Mar-1956  REFERRING CLINICIAN: [name], MD HISTORY FROM: patient  REASON FOR VISIT: new consult    HISTORICAL  CHIEF COMPLAINT:  Chief Complaint  Patient presents with  . Episode of shaking    rm 6 new problem- "shaking comes and goes over whole body for several months; I get weak, lethargic, don't know what's going on; pass out and have incontinence"    HISTORY OF PRESENT ILLNESS:   NEW HPI (08/16/19): 64 year old male here for evaluation of intermittent tremor, fatigue, lethargy, loss of consciousness.  For past 1 year patient has had intermittent episodes of tremors, lethargy, fatigue.  Some of these episodes have been associated with hypoglycemia.  Patient went to the emergency room on 05/17/2019 for a severe episode of hypoglycemia.  Patient has followed up with PCP and endocrinologist.  However patient was referred here to have evaluation of other potential causes of the spells. No tongue biting, incontinence, postictal confusion with any of the spells.   PRIOR HPI (09/10/16): 64 year old male with hypertension, diabetes, heart disease, atrial fibrillation, depression, rheumatoid arthritis, CHF, MI, here for evaluation of memory loss, headaches, neuropathy.  For past 1-2 years patient has had worsening short-term memory loss, difficulty retaining new information, having delayed recall, having difficulty with conversations, having to take notes and set alarms. He has been using his phone to set alarms or reminders, make lists, stay organized and is now able to function a little bit better. Previously he was able to function as a daily basis without having to use so many organizing and reminder tools.  Patient also having intermittent sharp throbbing daily headaches. No nausea or vomiting. No sensitivity to light or sound.  Patient also has numbness and tingling and pain in his feet, related to diabetes and  diabetic neuropathy. He had EMG nerve conduction study in June 2017 which confirms sensory neuropathy. Patient currently on Lyrica and hydrocodone for pain.  REVIEW OF SYSTEMS: Full 14 system review of systems performed and negative with exception of: As per HPI.  ALLERGIES: Allergies  Allergen Reactions  . Acetaminophen     HOME MEDICATIONS: Outpatient Medications Prior to Visit  Medication Sig Dispense Refill  . Accu-Chek FastClix Lancets MISC Use one strip to monitor glucose levels BID; E11.42 102 each 11  . albuterol (PROVENTIL HFA;VENTOLIN HFA) 108 (90 Base) MCG/ACT inhaler Inhale 2 puffs into the lungs every 6 (six) hours as needed for wheezing or shortness of breath. 1 Inhaler 0  . allopurinol (ZYLOPRIM) 100 MG tablet Take 100 mg by mouth daily.    [name] atorvastatin (LIPITOR) 40 MG tablet Take 1 tablet (40 mg total) by mouth daily. (Patient taking differently: Take 80 mg by mouth daily. ) 90 tablet 3  . carvedilol (COREG) 12.5 MG tablet Take 1 tablet (12.5 mg total) by mouth 2 (two) times daily with a meal. 180 tablet 0  . diazepam (VALIUM) 2 MG tablet 1/2  tab PO 1/2 hr prior to MRI.  If needed, you can take the other 1/2 tab 1 tablet 0  . DULoxetine (CYMBALTA) 20 MG capsule Take 1 capsule by mouth once daily 90 capsule 1  . EQ ASPIRIN ADULT LOW DOSE 81 MG EC tablet TAKE 1 TABLET BY MOUTH ONCE DAILY. SWALLOW WHOLE 90 tablet 0  . famotidine (PEPCID) 20 MG tablet Take 1 tablet (20 mg total) by mouth 2 (two) times daily. 60 tablet 6  . glucose blood (ACCU-CHEK GUIDE) test strip Use  one strip to monitor glucose levels BID; E11.42 100 each 12  . hydrALAZINE (APRESOLINE) 50 MG tablet Take 1 tablet (50 mg total) by mouth every 8 (eight) hours. 90 tablet 4  . HYDROcodone-acetaminophen (NORCO) 10-325 MG tablet Take 1 tablet by mouth every 8 (eight) hours.    . Insulin Lispro Prot & Lispro (HUMALOG MIX 75/25 KWIKPEN) (75-25) 100 UNIT/ML Kwikpen Inject 150 Units into the skin daily with breakfast.  20 pen 11  . Insulin Pen Needle (PEN NEEDLES) 31G X 6 MM MISC 2 Devices by Does not apply route daily. 180 each 3  . isosorbide mononitrate (IMDUR) 30 MG 24 hr tablet Take 1 tablet by mouth once daily 90 tablet 1  . pregabalin (LYRICA) 100 MG capsule Take 1 capsule (100 mg total) by mouth 3 (three) times daily. (Patient taking differently: Take 100 mg by mouth 2 (two) times daily. ) 90 capsule 3  . sacubitril-valsartan (ENTRESTO) 97-103 MG Take 1 tablet by mouth 2 (two) times daily. 60 tablet 0  . Secukinumab (COSENTYX) 150 MG/ML SOSY Inject 150 mg into the skin every 28 (twenty-eight) days.     . Tafamidis (VYNDAMAX) 61 MG CAPS Take 61 mg by mouth daily. 30 capsule 11  . zolpidem (AMBIEN CR) 6.25 MG CR tablet Take 1 tablet (6.25 mg total) by mouth at bedtime as needed. for sleep.  To fill on or after 06/19/2019. Each prescription to last 1 month 30 tablet 2  . furosemide (LASIX) 40 MG tablet Take 1 tablet (40 mg total) by mouth 2 (two) times daily. 180 tablet 3  . Potassium Chloride ER 20 MEQ TBCR Take 20 mEq by mouth daily. 90 tablet 3   No facility-administered medications prior to visit.    PAST MEDICAL HISTORY: Past Medical History:  Diagnosis Date  . Benign colon polyp 08/01/2013   [facility] in [place]. large base tranverse colon polyp was biopsied. polyp was benign with minimal surface hyperplastic change.  . Chronic combined systolic and diastolic CHF (congestive heart failure) ([place])   . Chronic pain   . CKD (chronic kidney disease), stage III   . Diabetes mellitus without complication ([place])   . Diverticulosis   . Esophageal hiatal hernia 07/29/2013   confirmed on EGD   . Esophageal stricture   . Essential hypertension   . Gastritis 07/29/2013   confirmed on EGD, bx done an negative for intestinal metaplasia, dsyplasia or H. pylori. normal gastric emptying study done 07/13/2013.  [name] GERD (gastroesophageal reflux disease)   . Morbid obesity ([place])   . Non-obstructive  CAD    a. 02/2013 Cath ([place]): nonobs dzs;  b. 09/2014 Myoview ([place]): EF 55%, no ischemia;  c. Cath 05/2018 mild CAD no obstruction.  . Persistent atrial fibrillation ([place])    a. 02/2013 s/p rfca in [place], [place]-->prev on Xarelto, d/c'd 2/2 anemia, ? GIB.  [name] Rheumatoid arthritis ([place])   . Sigmoid diverticulosis 08/01/2013   confirmed on colonscopy. record scanned into chart    PAST SURGICAL HISTORY: Past Surgical History:  Procedure Laterality Date  . CARDIAC CATHETERIZATION  05/2014   ablation for atrial fibrillation  . CARDIAC CATHETERIZATION N/A 11/16/2015   Procedure: Left Heart Cath and Coronary Angiography;  Surgeon: Leonie Man, MD;  Location: [facility];  Service: Cardiovascular;  Laterality: N/A;  . COLON RESECTION  09/2013   due to large, abnormal polpy. non cancerous per patient.   . COLON SURGERY  09/2014   colon resection   .  LEFT HEART CATH AND CORONARY ANGIOGRAPHY N/A 05/19/2018   Procedure: LEFT HEART CATH AND CORONARY ANGIOGRAPHY;  Surgeon: Wellington Hampshire, MD;  Location: [facility];  Service: Cardiovascular;  Laterality: N/A;    FAMILY HISTORY: Family History  Problem Relation Age of Onset  . Hypertension Mother   . Diabetes Mother   . COPD Mother   . Lung cancer Mother        Smoker   . Hypertension Father   . Diabetes Father   . Heart Problems Father   . COPD Father   . Colon cancer Neg Hx   . Stomach cancer Neg Hx     SOCIAL HISTORY: Social History   Socioeconomic History  . Marital status: Married    Spouse name: Amethyst  . Number of children: 8  . Years of education: 5  . Highest education level: Some college, no degree  Occupational History    Comment: disabled  Tobacco Use  . Smoking status: Former Smoker    Packs/day: 0.50    Years: 10.00    Pack years: 5.00    Types: Cigarettes    Quit date: 04/11/2008    Years since quitting: 11.3  . Smokeless tobacco: Never Used  Substance and Sexual Activity  . Alcohol use: No      Alcohol/week: 0.0 standard drinks  . Drug use: No  . Sexual activity: Yes    Partners: Female    Birth control/protection: None  Other Topics Concern  . Not on file  Social History Narrative   Lives with wife. Does not work.  On disability.    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 Provided crisis assistance program information to assist with bills       Has issues getting food- gets $36/month in food stamps- already has list of food pantries but has not tried them- encouraged pt to try food pantries and reach out to clinic for help if needed   Social Determinants of Health   Financial Resource Strain:   . Difficulty of Paying Living Expenses: Not on file  Food Insecurity:   . Worried About Charity fundraiser in the Last Year: Not on file  . Ran Out of Food in the Last Year: Not on file  Transportation Needs:   . Lack of Transportation (Medical): Not on file  . Lack of Transportation (Non-Medical): Not on file  Physical Activity:   . Days of Exercise per Week: Not on file  . Minutes of Exercise per Session: Not on file  Stress:   . Feeling of Stress : Not on file  Social Connections:   . Frequency of Communication with Friends and Family: Not on file  . Frequency of Social Gatherings with Friends and Family: Not on file  . Attends Religious Services: Not on file  . Active Member of Clubs or Organizations: Not on file  . Attends Archivist Meetings: Not on file  . Marital Status: Not on file  Intimate Partner Violence:   . Fear of Current or Ex-Partner: Not on file  . Emotionally Abused: Not on file  . Physically Abused: Not on file  . Sexually Abused: Not on file     PHYSICAL EXAM  GENERAL EXAM/CONSTITUTIONAL: Vitals:  Vitals:   08/15/19  1633  BP: 116/63  Pulse: 68   Temp: 98.1 F (36.7 C)  Weight: 246 lb (111.6 kg)  Height: 5\' 6"  (1.676 m)     Body mass index is 39.71 kg/m. Wt Readings from Last 3 Encounters:  08/15/19 246 lb (111.6 kg)  07/29/19 246 lb (111.6 kg)  07/28/19 244 lb (110.7 kg)     Patient is in no distress; well developed, nourished and groomed; neck is supple  CARDIOVASCULAR:  Examination of carotid arteries is normal; no carotid bruits  Regular rate and rhythm, no murmurs  Examination of peripheral vascular system by observation and palpation is normal  EYES:  Ophthalmoscopic exam of optic discs and posterior segments is normal; no papilledema or hemorrhages  No exam data present  MUSCULOSKELETAL:  Gait, strength, tone, movements noted in Neurologic exam below  NEUROLOGIC: MENTAL STATUS:  MMSE - Mini Mental State Exam 09/10/2016  Orientation to time 5  Orientation to Place 5  Registration 3  Attention/ Calculation 5  Recall 2  Language- name 2 objects 2  Language- repeat 0  Language- follow 3 step command 3  Language- read & follow direction 1  Write a sentence 1  Copy design 0  Total score 27    awake, alert, oriented to person, place and time  recent and remote memory intact  normal attention and concentration  language fluent, comprehension intact, naming intact  fund of knowledge appropriate  CRANIAL NERVE:   2nd - no papilledema on fundoscopic exam  2nd, 3rd, 4th, 6th - pupils equal and reactive to light, visual fields full to confrontation, extraocular muscles intact, no nystagmus  5th - facial sensation symmetric  7th - facial strength symmetric  8th - hearing intact  9th - palate elevates symmetrically, uvula midline  11th - shoulder shrug symmetric  12th - tongue protrusion midline  MOTOR:   normal bulk and tone, full strength in the BUE, BLE  SENSORY:   normal and symmetric to light touch  COORDINATION:   finger-nose-finger, fine finger movements  normal  REFLEXES:   deep tendon reflexes TRACE and symmetric  GAIT/STATION:   narrow based gait     DIAGNOSTIC DATA (LABS, IMAGING, TESTING) - I reviewed patient records, labs, notes, testing and imaging myself where available.  Lab Results  Component Value Date   WBC 7.7 05/17/2019   HGB 13.3 05/17/2019   HCT 41.9 05/17/2019   MCV 95.4 05/17/2019   PLT 227 05/17/2019      Component Value Date/Time   NA 142 05/17/2019 1507   NA 146 (H) 04/22/2019 1121   K 3.7 05/17/2019 1507   CL 101 05/17/2019 1507   CO2 30 05/17/2019 1507   GLUCOSE 63 (L) 05/17/2019 1507   BUN 17 05/17/2019 1507   BUN 20 04/22/2019 1121   CREATININE 1.58 (H) 05/17/2019 1507   CREATININE 1.42 (H) 09/09/2016 1037   CALCIUM 8.6 (L) 05/17/2019 1507   PROT 7.1 05/17/2019 1507   PROT 6.9 04/22/2019 1121   ALBUMIN 3.4 (L) 05/17/2019 1507   ALBUMIN 3.8 04/22/2019 1121   AST 13 (L) 05/17/2019 1507   ALT 21 05/17/2019 1507   ALKPHOS 49 05/17/2019 1507   BILITOT 0.9 05/17/2019 1507   BILITOT 0.4 04/22/2019 1121   GFRNONAA 46 (L) 05/17/2019 1507   GFRNONAA 53 (L) 09/09/2016 1037   GFRAA 53 (L) 05/17/2019 1507   GFRAA 61 09/09/2016 1037   Lab Results  Component Value Date   CHOL 126 04/22/2019   HDL 32 (  L) 04/22/2019   LDLCALC 70 04/22/2019   TRIG 134 04/22/2019   CHOLHDL 3.9 04/22/2019   Lab Results  Component Value Date   HGBA1C 7.9 (A) 07/29/2019   Lab Results  Component Value Date   VITAMINB12 338 05/12/2017   Lab Results  Component Value Date   TSH 1.370 01/29/2018    05/17/19 CT head / cervical spine [I reviewed images myself and agree with interpretation. -VRP]  1. No acute intracranial abnormality. 2. Chronic sinusitis. Expansile process left maxillary sinus possibly due to polyps unchanged from prior study. 3. Negative for cervical spine fracture.    ASSESSMENT AND PLAN  64 y.o. year old male here with intermittent episodes of tremor, spasms, lethargy and syncope, likely  related to hypoglycemia attacks.  Dx:  1. Hypoglycemia     PLAN:  INTERMITTENT MUSCLE SPASMS / LETHARGY / SYNCOPE (? hypoglycemia attacks) - follow up with [name]  - According to [facility] law, you can not drive unless you are syncope free for at least 6 months and under physician's care.   - Please maintain precautions. Do not participate in activities where a loss of awareness could harm you or someone else. No swimming alone, no tub bathing, no hot tubs, no driving, no operating motorized vehicles (cars, ATVs, motocycles, etc), lawnmowers, power tools or firearms. No standing at heights, such as rooftops, ladders or stairs. Avoid hot objects such as stoves, heaters, open fires. Wear a helmet when riding a bicycle, scooter, skateboard, etc. and avoid areas of traffic. Set your water heater to 120 degrees or less.   Return for return to PCP, pending if symptoms worsen or fail to improve.  I spent 45 minutes of face-to-face and non-face-to-face time with patient.  This included previsit chart review, lab review, study review, order entry, electronic health record documentation, patient education.     Penni Bombard, MD [id number], [id number] PM Certified in Neurology, Neurophysiology and Neuroimaging  [facility] [address] [place], [place] [address] [phone]

## 2019-08-15 NOTE — Patient Instructions (Signed)
INTERMITTENT MUSCLE SPASMS / LETHARGY / SYNCOPE (? hypoglycemia attacks)  - follow up with [name]  - According to [facility] law, you can not drive unless you are syncope free for at least 6 months and under physician's care.   - Please maintain precautions. Do not participate in activities where a loss of awareness could harm you or someone else. No swimming alone, no tub bathing, no hot tubs, no driving, no operating motorized vehicles (cars, ATVs, motocycles, etc), lawnmowers, power tools or firearms. No standing at heights, such as rooftops, ladders or stairs. Avoid hot objects such as stoves, heaters, open fires. Wear a helmet when riding a bicycle, scooter, skateboard, etc. and avoid areas of traffic. Set your water heater to 120 degrees or less.

## 2019-08-16 ENCOUNTER — Encounter: Payer: Self-pay | Admitting: Diagnostic Neuroimaging

## 2019-08-18 ENCOUNTER — Other Ambulatory Visit: Payer: Self-pay

## 2019-08-18 ENCOUNTER — Other Ambulatory Visit (HOSPITAL_COMMUNITY): Payer: Self-pay | Admitting: Internal Medicine

## 2019-08-18 ENCOUNTER — Ambulatory Visit
Admission: RE | Admit: 2019-08-18 | Discharge: 2019-08-18 | Disposition: A | Discharge status: Home / Self Care | Payer: Medicaid Other | Source: Ambulatory Visit | Attending: Internal Medicine | Admitting: Internal Medicine

## 2019-08-18 DIAGNOSIS — N2889 Other specified disorders of kidney and ureter: Secondary | ICD-10-CM

## 2019-08-18 MED ORDER — GADOBENATE DIMEGLUMINE 529 MG/ML IV SOLN
20.0000 mL | Freq: Once | INTRAVENOUS | Status: AC | PRN
  Administered 2019-08-18: 20 mL via INTRAVENOUS

## 2019-08-22 ENCOUNTER — Telehealth: Payer: Self-pay | Admitting: Diagnostic Neuroimaging

## 2019-08-22 NOTE — Telephone Encounter (Signed)
Pt called stating he is still having the shakes and his sugar is doing just fine. Pt would like to discuss this. Please advise.

## 2019-08-23 ENCOUNTER — Other Ambulatory Visit: Payer: Self-pay | Admitting: Sports Medicine

## 2019-08-23 ENCOUNTER — Other Ambulatory Visit: Payer: Self-pay

## 2019-08-23 ENCOUNTER — Ambulatory Visit: Payer: Medicaid Other

## 2019-08-23 ENCOUNTER — Ambulatory Visit: Payer: Medicaid Other | Admitting: Sports Medicine

## 2019-08-23 DIAGNOSIS — M79674 Pain in right toe(s): Secondary | ICD-10-CM

## 2019-08-23 DIAGNOSIS — M79672 Pain in left foot: Secondary | ICD-10-CM | POA: Diagnosis not present

## 2019-08-23 DIAGNOSIS — M79675 Pain in left toe(s): Secondary | ICD-10-CM

## 2019-08-23 DIAGNOSIS — M79671 Pain in right foot: Secondary | ICD-10-CM

## 2019-08-23 DIAGNOSIS — E1142 Type 2 diabetes mellitus with diabetic polyneuropathy: Secondary | ICD-10-CM

## 2019-08-23 DIAGNOSIS — B351 Tinea unguium: Secondary | ICD-10-CM | POA: Diagnosis not present

## 2019-08-23 NOTE — Telephone Encounter (Signed)
LVM recommending he monitor his symptoms, follow up closely with Dr Loanne Drilling and PCP for hypoglycemia control. Let us know if symptoms worsen after monitoring for a few weeks. Left # for call back.

## 2019-08-23 NOTE — Progress Notes (Signed)
Subjective: Marco [name] is a 64 y.o. male patient with history of diabetes who presents to office today complaining of long,mildly painful nails  while ambulating in shoes; unable to trim. Patient states that the glucose reading this morning was not recorded. Patient admits continued numbness, burning or tingling in the legs on Lyrica.  Review of Systems  All other systems reviewed and are negative.   Patient Active Problem List   Diagnosis Date Noted  . Gastroesophageal reflux disease without esophagitis 08/31/2018  . Muscle spasm 08/31/2018  . NICM (nonischemic cardiomyopathy) ([place]) 07/06/2018  . Hypokalemia   . Hyperlipidemia 12/23/2016  . Chronic combined systolic and diastolic CHF (congestive heart failure) ([place]) 09/09/2016  . Diabetic retinopathy ([place]) 07/22/2016  . Diabetic polyneuropathy associated with type 2 diabetes mellitus ([place]) 06/10/2016  . Vision changes 06/02/2016  . Myalgia and myositis 03/31/2016  . Shortness of breath 02/09/2016  . Chronic gouty arthritis 11/29/2015  . LBP (low back pain) 11/27/2015  . Arthralgia of multiple joints 11/27/2015  . 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[place]) 11/27/2015  . Breast pain in male 11/14/2015  . Sinusitis, chronic 10/16/2015  . Insomnia 10/16/2015  . New onset of headaches after age 7 09/20/2015  . OSA (obstructive sleep apnea) 07/12/2015  . Low serum testosterone level 05/18/2015  . Depression 05/16/2015  . Morbid obesity due to excess calories ([place])   . Chronic pain   . Diabetes ([place])   . CAD (coronary artery disease) 12/24/2014  . Atrial fibrillation ([place]) 12/24/2014  . Essential hypertension 12/24/2014  . Chronic kidney disease 12/24/2014  . PUD (peptic ulcer disease) 12/24/2014   Current Outpatient Medications on File Prior to Visit  Medication Sig Dispense Refill  . Accu-Chek FastClix Lancets MISC Use one strip to monitor glucose levels BID; E11.42 102 each 11  . albuterol (PROVENTIL HFA;VENTOLIN HFA) 108 (90 Base)  MCG/ACT inhaler Inhale 2 puffs into the lungs every 6 (six) hours as needed for wheezing or shortness of breath. 1 Inhaler 0  . allopurinol (ZYLOPRIM) 100 MG tablet Take 100 mg by mouth daily.    [name] atorvastatin (LIPITOR) 40 MG tablet Take 1 tablet (40 mg total) by mouth daily. (Patient taking differently: Take 80 mg by mouth daily. ) 90 tablet 3  . carvedilol (COREG) 12.5 MG tablet Take 1 tablet (12.5 mg total) by mouth 2 (two) times daily with a meal. 180 tablet 0  . diazepam (VALIUM) 2 MG tablet 1/2  tab PO 1/2 hr prior to MRI.  If needed, you can take the other 1/2 tab 1 tablet 0  . DULoxetine (CYMBALTA) 20 MG capsule Take 1 capsule by mouth once daily 90 capsule 1  . EQ ASPIRIN ADULT LOW DOSE 81 MG EC tablet TAKE 1 TABLET BY MOUTH ONCE DAILY. SWALLOW WHOLE 90 tablet 0  . famotidine (PEPCID) 20 MG tablet Take 1 tablet (20 mg total) by mouth 2 (two) times daily. 60 tablet 6  . furosemide (LASIX) 40 MG tablet Take 1 tablet (40 mg total) by mouth 2 (two) times daily. 180 tablet 3  . glucose blood (ACCU-CHEK GUIDE) test strip Use one strip to monitor glucose levels BID; E11.42 100 each 12  . hydrALAZINE (APRESOLINE) 50 MG tablet Take 1 tablet (50 mg total) by mouth every 8 (eight) hours. 90 tablet 4  . HYDROcodone-acetaminophen (NORCO) 10-325 MG tablet Take 1 tablet by mouth every 8 (eight) hours.    . Insulin Lispro Prot & Lispro (HUMALOG MIX 75/25 KWIKPEN) (75-25) 100 UNIT/ML Kwikpen Inject 150 Units  into the skin daily with breakfast. 20 pen 11  . Insulin Pen Needle (PEN NEEDLES) 31G X 6 MM MISC 2 Devices by Does not apply route daily. 180 each 3  . isosorbide mononitrate (IMDUR) 30 MG 24 hr tablet Take 1 tablet by mouth once daily 90 tablet 1  . Potassium Chloride ER 20 MEQ TBCR Take 20 mEq by mouth daily. 90 tablet 3  . pregabalin (LYRICA) 100 MG capsule Take 1 capsule (100 mg total) by mouth 3 (three) times daily. (Patient taking differently: Take 100 mg by mouth 2 (two) times daily. ) 90  capsule 3  . sacubitril-valsartan (ENTRESTO) 97-103 MG Take 1 tablet by mouth 2 (two) times daily. Needs apt for further refills 60 tablet 1  . Secukinumab (COSENTYX) 150 MG/ML SOSY Inject 150 mg into the skin every 28 (twenty-eight) days.     . Tafamidis (VYNDAMAX) 61 MG CAPS Take 61 mg by mouth daily. 30 capsule 11  . zolpidem (AMBIEN CR) 6.25 MG CR tablet Take 1 tablet (6.25 mg total) by mouth at bedtime as needed. for sleep.  To fill on or after 06/19/2019. Each prescription to last 1 month 30 tablet 2   No current facility-administered medications on file prior to visit.   Allergies  Allergen Reactions  . Nsaids Other (See Comments)    Stomach pains. Ulcers - stated by patient     Recent Results (from the past 2160 hour(s))  Glucose (CBG)     Status: Abnormal   Collection Time: 06/17/19  2:51 PM  Result Value Ref Range   POC Glucose 176 (A) 70 - 99 mg/dl  POCT HgB A1C     Status: Abnormal   Collection Time: 07/29/19 11:12 AM  Result Value Ref Range   Hemoglobin A1C 7.9 (A) 4.0 - 5.6 %   HbA1c POC (<> result, manual entry)     HbA1c, POC (prediabetic range)     HbA1c, POC (controlled diabetic range)      Objective: General: Patient is awake, alert, and oriented x 3 and in no acute distress.  Integument: Skin is warm, dry and supple bilateral. Nails are tender, long, thickened and dystrophic with subungual debris, consistent with onychomycosis, 1-5 bilateral. No signs of infection. No open lesions or preulcerative lesions present bilateral. Remaining integument unremarkable.  Vasculature:  Dorsalis Pedis pulse 1/4 bilateral. Posterior Tibial pulse  1/4 bilateral. Capillary fill time <3 sec 1-5 bilateral. Positive hair growth to the level of the digits.Temperature gradient within normal limits. No varicosities present bilateral. No edema present bilateral.   Neurology: The patient has diminshed sensation measured with a 5.07/10g Semmes Weinstein Monofilament at all pedal sites  bilateral . Vibratory sensation diminished bilateral with tuning fork. No Babinski sign present bilateral.   Musculoskeletal: Asymptomatic pes planus pedal deformities noted bilateral. Muscular strength 5/5 in all lower extremity muscular groups bilateral without pain on range of motion . No tenderness with calf compression bilateral.  Xrays Pes planus no other acute findings   Assessment and Plan: Problem List Items Addressed This Visit    None    Visit Diagnoses    Bilateral foot pain    -  Primary   Relevant Orders   DG Foot Complete Right   DG Foot Complete Left   Type 2 diabetes mellitus with polyneuropathy (HCC)       Pain due to onychomycosis of toenails of both feet          -Examined patient. -Re-discussed and educated patient on  diabetic foot care, especially with  regards to the vascular, neurological and musculoskeletal systems.  -Stressed the importance of good glycemic control and the detriment of not  controlling glucose levels in relation to the foot. -Advised patient to talk with PCP/RA increase on Lyrica  -Mechanically debrided all nails 1-5 bilateral using sterile nail nipper and filed with dremel without incident  -Answered all patient questions -Patient to return  in 3 months for at risk foot care -Patient advised to call the office if any problems or questions arise in the meantime.  Landis Martins, DPM

## 2019-09-08 ENCOUNTER — Other Ambulatory Visit (HOSPITAL_COMMUNITY): Payer: Self-pay | Admitting: Internal Medicine

## 2019-09-08 ENCOUNTER — Other Ambulatory Visit (HOSPITAL_COMMUNITY): Payer: Self-pay

## 2019-09-08 ENCOUNTER — Telehealth (HOSPITAL_COMMUNITY): Payer: Self-pay | Admitting: Pharmacist

## 2019-09-08 MED ORDER — POTASSIUM CHLORIDE ER 20 MEQ PO TBCR: 20.0000 meq | EXTENDED_RELEASE_TABLET | Freq: Every day | ORAL | 3 refills | Status: DC

## 2019-09-08 MED ORDER — VYNDAMAX 61 MG PO CAPS: 61.0000 mg | ORAL_CAPSULE | Freq: Every day | ORAL | 11 refills | Status: DC

## 2019-09-08 MED FILL — POTASSIUM CHLORIDE CRYS ER: 20 | 90 days supply | Qty: 90 | Fill #0

## 2019-09-08 MED FILL — VYNDAMAX 61 MG CAPS: 61 | 30 days supply | Qty: 30 | Fill #0

## 2019-09-08 NOTE — Telephone Encounter (Signed)
Spoke with patient, will mail his Vyndamax from [facility] along with Potassium. He should receive both of those by Friday.  Will work on transferring his whole medication profile from [facility] as well.  Patient is aware that the specialty pharmacy will call him monthly to discuss mailing of his Vyndamax. He will call about refills that are outside of his scheduled monthly call.   Advised him to call me with any issues.  Charlann Boxer, CPhT

## 2019-09-09 ENCOUNTER — Other Ambulatory Visit: Payer: Self-pay | Admitting: Critical Care Medicine

## 2019-09-09 ENCOUNTER — Telehealth (HOSPITAL_COMMUNITY): Payer: Self-pay | Admitting: Pharmacy Technician

## 2019-09-09 DIAGNOSIS — K219 Gastro-esophageal reflux disease without esophagitis: Secondary | ICD-10-CM

## 2019-09-09 NOTE — Telephone Encounter (Signed)
Called and spoke with [facility] around 10am and requested a profile transfer to be sent to [facility] where patient will be getting his specialty medication sent from.   Been checking on the status throughout the day and they have not sent anything over. Called [facility] again, they are supposed to be faxing his profile over.  Charlann Boxer, CPhT

## 2019-09-12 ENCOUNTER — Other Ambulatory Visit: Payer: Self-pay | Admitting: Cardiology

## 2019-09-12 ENCOUNTER — Other Ambulatory Visit (HOSPITAL_COMMUNITY): Payer: Self-pay

## 2019-09-12 MED ORDER — CARVEDILOL 12.5 MG PO TABS: 12.5000 mg | ORAL_TABLET | Freq: Two times a day (BID) | ORAL | 1 refills | Status: DC

## 2019-09-12 MED ORDER — ISOSORBIDE MONONITRATE ER 30 MG PO TB24: 30.0000 mg | ORAL_TABLET | Freq: Every day | ORAL | 3 refills | Status: DC

## 2019-09-16 ENCOUNTER — Other Ambulatory Visit: Payer: Self-pay

## 2019-09-16 ENCOUNTER — Ambulatory Visit: Payer: Medicaid Other | Attending: Internal Medicine | Admitting: Internal Medicine

## 2019-09-16 DIAGNOSIS — I5042 Chronic combined systolic (congestive) and diastolic (congestive) heart failure: Secondary | ICD-10-CM | POA: Diagnosis not present

## 2019-09-16 DIAGNOSIS — I10 Essential (primary) hypertension: Secondary | ICD-10-CM

## 2019-09-16 DIAGNOSIS — E854 Organ-limited amyloidosis: Secondary | ICD-10-CM

## 2019-09-16 DIAGNOSIS — Z794 Long term (current) use of insulin: Secondary | ICD-10-CM

## 2019-09-16 DIAGNOSIS — E1159 Type 2 diabetes mellitus with other circulatory complications: Secondary | ICD-10-CM | POA: Diagnosis not present

## 2019-09-16 DIAGNOSIS — I43 Cardiomyopathy in diseases classified elsewhere: Secondary | ICD-10-CM

## 2019-09-16 DIAGNOSIS — R0981 Nasal congestion: Secondary | ICD-10-CM

## 2019-09-16 DIAGNOSIS — F5104 Psychophysiologic insomnia: Secondary | ICD-10-CM

## 2019-09-16 DIAGNOSIS — N2889 Other specified disorders of kidney and ureter: Secondary | ICD-10-CM

## 2019-09-16 MED ORDER — FLUTICASONE PROPIONATE 50 MCG/ACT NA SUSP: 2.0000 | Freq: Every day | NASAL | 6 refills | Status: DC

## 2019-09-16 NOTE — Progress Notes (Signed)
Virtual Visit via Telephone Note Due to current restrictions/limitations of in-office visits due to the COVID-19 pandemic, this scheduled clinical appointment was converted to a telehealth visit  I connected with Marco [name] on 09/16/19 at 11:10 AM EST by telephone and verified that I am speaking with the correct person using two identifiers. I am in my office.  The patient is at home.  Only the patient and myself participated in this encounter.  I discussed the limitations, risks, security and privacy concerns of performing an evaluation and management service by telephone and the availability of in person appointments. I also discussed with the patient that there may be a patient responsible charge related to this service. The patient expressed understanding and agreed to proceed.   History of Present Illness: history of HTN,DM2 with neuropathy, retinopathyand nephropathy, CKD stage III, obesity, nonobstructive CAD/cardiac amyloid, chronic combined CHF, OSA on VPAP, followed by rheumatology in [facility] for psoriasis/gout/nonspec arthritis, chronic insomnia. On narcotics through [facility] for chronic pain inchest/back/shoulders/neck. Last evaluated 06/2019.  Purpose of today's visit is chronic disease management.  RT kidney lesion: Since last visit he had the MRI to evaluate lesion seen on the right kidney.  This revealed 8 mm subcapsular lesion in upper pole of the right kidney which is difficult to characterize due to its small size.  Small papillary renal cell carcinoma cannot be excluded.  Radiologist recommended follow-up MRI in 6 months.  Patient states that this is what the nephrologist plans to do and he is okay with that.  DIABETES TYPE 2/Obesity Last A1C:   Results for orders placed or performed in visit on 07/29/19  POCT HgB A1C  Result Value Ref Range   Hemoglobin A1C 7.9 (A) 4.0 - 5.6 %   HbA1c POC (<> result, manual entry)     HbA1c, POC (prediabetic range)     HbA1c,  POC (controlled diabetic range)      Med Adherence:  [x]  Yes -on Humalog 75/25 150 units daily.  Had seen Dr. Vira Agar in December and dose was decreased because of hypoglycemic episodes that he was having.   []  No Medication side effects:  []  Yes    [x]  No Home Monitoring?  [x]  Yes BID   []  No Home glucose results range: before BF 150-160. Lowest 80s.  Before dinner 150-170 Diet Adherence: [x]  Yes    []  No Exercise: Not much.  Weight has remained unchanged.  Most recent weight was 246 as noted in the system from last month Hypoglycemic episodes?: []  Yes    [x]  No -none since the dose of insulin was decreased Numbness of the feet? []  Yes    []  No Retinopathy hx? [x]  Yes    []  No Last eye exam: eye exam scheduled for next wk with [facility] Comments: wgh unchaged.    HYPERTENSION/CAD/CHF Currently taking: see medication list Med Adherence: [x]  Yes    []  No Medication side effects: []  Yes    [x]  No Adherence with salt restriction: [x]  Yes    []  No Home Monitoring?: []  Yes    [x]  No.  However I note that recent blood pressure reading in the system done on 08/15/2019 was 116/63. Monitoring Frequency: []  Yes    []  No Home BP results range: []  Yes    []  No SOB? [x]  Yes a little with     Chest Pain?: []  Yes    [x]  No Leg swelling?: []  Yes    [x]  No Headaches?: []  Yes    [  x] No Dizziness? []  Yes    [x]  No Comments: Patient denies any PND orthopnea.  Arthritis/psoriasis: Followed by rheumatology.  Last seen September 2020.  Has an appointment coming up.  He is still on the Cosentyx  Insomnia: He reports that he still has problem getting good nights rest despite being on Ambien.  He tells me that he does not have a set time that he gets in bed in various.  Sometimes he does not get in bed until 2 AM.  He watches TV but then turns it off once in bed.  He denies drinking any caffeinated beverages in the evenings.  Sleep for 1.5 -3hrs at a time then wakes up and sometimes has problems going back to sleep.   -Reports compliance with CPAP machine but feels that this may be playing a role in poor sleep because it causes a lot of nasal congestion for him .  Outpatient Encounter Medications as of 09/16/2019  Medication Sig  . Accu-Chek FastClix Lancets MISC Use one strip to monitor glucose levels BID; E11.42  . albuterol (PROVENTIL HFA;VENTOLIN HFA) 108 (90 Base) MCG/ACT inhaler Inhale 2 puffs into the lungs every 6 (six) hours as needed for wheezing or shortness of breath.  . allopurinol (ZYLOPRIM) 100 MG tablet Take 100 mg by mouth daily.  [name] atorvastatin (LIPITOR) 40 MG tablet Take 1 tablet (40 mg total) by mouth daily. (Patient taking differently: Take 80 mg by mouth daily. )  . carvedilol (COREG) 12.5 MG tablet Take 1 tablet (12.5 mg total) by mouth 2 (two) times daily with a meal.  . diazepam (VALIUM) 2 MG tablet 1/2  tab PO 1/2 hr prior to MRI.  If needed, you can take the other 1/2 tab  . DULoxetine (CYMBALTA) 20 MG capsule Take 1 capsule by mouth once daily  . EQ ASPIRIN ADULT LOW DOSE 81 MG EC tablet TAKE 1 TABLET BY MOUTH ONCE DAILY. SWALLOW WHOLE  . famotidine (PEPCID) 20 MG tablet TAKE 1 TABLET BY MOUTH TWICE DAILY  . furosemide (LASIX) 40 MG tablet Take 1 tablet (40 mg total) by mouth 2 (two) times daily.  [name] glucose blood (ACCU-CHEK GUIDE) test strip Use one strip to monitor glucose levels BID; E11.42  . hydrALAZINE (APRESOLINE) 50 MG tablet Take 1 tablet (50 mg total) by mouth every 8 (eight) hours.  [name] HYDROcodone-acetaminophen (NORCO) 10-325 MG tablet Take 1 tablet by mouth every 8 (eight) hours.  . Insulin Lispro Prot & Lispro (HUMALOG MIX 75/25 KWIKPEN) (75-25) 100 UNIT/ML Kwikpen Inject 150 Units into the skin daily with breakfast.  . Insulin Pen Needle (PEN NEEDLES) 31G X 6 MM MISC 2 Devices by Does not apply route daily.  . isosorbide mononitrate (IMDUR) 30 MG 24 hr tablet Take 1 tablet (30 mg total) by mouth daily.  . Potassium Chloride ER 20 MEQ TBCR Take 20 mEq by mouth daily.  .  pregabalin (LYRICA) 100 MG capsule Take 1 capsule (100 mg total) by mouth 3 (three) times daily. (Patient taking differently: Take 100 mg by mouth 2 (two) times daily. )  . sacubitril-valsartan (ENTRESTO) 97-103 MG Take 1 tablet by mouth 2 (two) times daily. Needs apt for further refills  . Secukinumab (COSENTYX) 150 MG/ML SOSY Inject 150 mg into the skin every 28 (twenty-eight) days.   . Tafamidis (VYNDAMAX) 61 MG CAPS Take 61 mg by mouth daily.  [name] zolpidem (AMBIEN CR) 6.25 MG CR tablet Take 1 tablet (6.25 mg total) by mouth at bedtime as needed. for  sleep.  To fill on or after 06/19/2019. Each prescription to last 1 month   No facility-administered encounter medications on file as of 09/16/2019.      Observations/Objective: No direct observation done as this was a telephone encounter  Assessment and Plan: 1. Type 2 diabetes mellitus with other circulatory complication, with long-term current use of insulin ([place]) -Followed by endocrinology.  He reports no recent hypoglycemic episodes.  Encouraged him to continue healthy eating habits.  Continue current dose of insulin as recommended by endocrinology  2. Morbid obesity due to excess calories ([place]) See #1 above  3. Chronic combined systolic and diastolic CHF (congestive heart failure) ([place]) By history he is compensated and stable on current medications.  Continue Entresto, furosemide, carvedilol, Keep upcoming appointment with cardiology 4. Cardiac amyloidosis ([place]) Followed by cardiology.  On tafamidis.  5. Essential hypertension Controlled.  Continue current medications and low-salt diet  6. Chronic insomnia Good sleep hygiene discussed.  Encourage patient to try to get in bed around about the same time every night give or take 30 minutes.  I have also prescribed some Flonase nasal spray to help decrease the nasal congestion that he experiences with the CPAP  7. Renal mass, right Plan for repeat MRI in 6 months  8. Nasal sinus  congestion - fluticasone (FLONASE) 50 MCG/ACT nasal spray; Place 2 sprays into both nostrils daily.  Dispense: 16 g; Refill: 6   Follow Up Instructions: 3-4 mths   I discussed the assessment and treatment plan with the patient. The patient was provided an opportunity to ask questions and all were answered. The patient agreed with the plan and demonstrated an understanding of the instructions.   The patient was advised to call back or seek an in-person evaluation if the symptoms worsen or if the condition fails to improve as anticipated.  I provided 18 minutes of non-face-to-face time during this encounter.   Karle Plumber, MD

## 2019-09-17 ENCOUNTER — Other Ambulatory Visit: Payer: Self-pay | Admitting: Internal Medicine

## 2019-09-17 DIAGNOSIS — G4700 Insomnia, unspecified: Secondary | ICD-10-CM

## 2019-09-21 MED FILL — ALLOPURINOL 100 MG TABS: 100 | 30 days supply | Qty: 60 | Fill #0

## 2019-09-26 ENCOUNTER — Ambulatory Visit (HOSPITAL_COMMUNITY)
Admission: RE | Admit: 2019-09-26 | Discharge: 2019-09-26 | Disposition: A | Discharge status: Home / Self Care | Payer: Medicaid Other | Source: Ambulatory Visit | Attending: Internal Medicine | Admitting: Internal Medicine

## 2019-09-26 ENCOUNTER — Other Ambulatory Visit: Payer: Self-pay

## 2019-09-26 ENCOUNTER — Encounter (HOSPITAL_COMMUNITY): Payer: Self-pay | Admitting: Internal Medicine

## 2019-09-26 VITALS — BP 124/68 | HR 73 | Wt 248.6 lb

## 2019-09-26 DIAGNOSIS — F40240 Claustrophobia: Secondary | ICD-10-CM | POA: Diagnosis not present

## 2019-09-26 DIAGNOSIS — G8929 Other chronic pain: Secondary | ICD-10-CM | POA: Diagnosis not present

## 2019-09-26 DIAGNOSIS — I130 Hypertensive heart and chronic kidney disease with heart failure and stage 1 through stage 4 chronic kidney disease, or unspecified chronic kidney disease: Secondary | ICD-10-CM | POA: Diagnosis not present

## 2019-09-26 DIAGNOSIS — I4891 Unspecified atrial fibrillation: Secondary | ICD-10-CM | POA: Insufficient documentation

## 2019-09-26 DIAGNOSIS — D649 Anemia, unspecified: Secondary | ICD-10-CM | POA: Insufficient documentation

## 2019-09-26 DIAGNOSIS — Z833 Family history of diabetes mellitus: Secondary | ICD-10-CM | POA: Diagnosis not present

## 2019-09-26 DIAGNOSIS — E854 Organ-limited amyloidosis: Secondary | ICD-10-CM | POA: Diagnosis not present

## 2019-09-26 DIAGNOSIS — I2510 Atherosclerotic heart disease of native coronary artery without angina pectoris: Secondary | ICD-10-CM | POA: Diagnosis not present

## 2019-09-26 DIAGNOSIS — R9431 Abnormal electrocardiogram [ECG] [EKG]: Secondary | ICD-10-CM | POA: Diagnosis not present

## 2019-09-26 DIAGNOSIS — G4733 Obstructive sleep apnea (adult) (pediatric): Secondary | ICD-10-CM | POA: Insufficient documentation

## 2019-09-26 DIAGNOSIS — E1122 Type 2 diabetes mellitus with diabetic chronic kidney disease: Secondary | ICD-10-CM | POA: Insufficient documentation

## 2019-09-26 DIAGNOSIS — Z886 Allergy status to analgesic agent status: Secondary | ICD-10-CM | POA: Insufficient documentation

## 2019-09-26 DIAGNOSIS — N183 Chronic kidney disease, stage 3 unspecified: Secondary | ICD-10-CM | POA: Diagnosis not present

## 2019-09-26 DIAGNOSIS — Z79899 Other long term (current) drug therapy: Secondary | ICD-10-CM | POA: Diagnosis not present

## 2019-09-26 DIAGNOSIS — C9110 Chronic lymphocytic leukemia of B-cell type not having achieved remission: Secondary | ICD-10-CM | POA: Insufficient documentation

## 2019-09-26 DIAGNOSIS — Z8249 Family history of ischemic heart disease and other diseases of the circulatory system: Secondary | ICD-10-CM | POA: Insufficient documentation

## 2019-09-26 DIAGNOSIS — Z7982 Long term (current) use of aspirin: Secondary | ICD-10-CM | POA: Diagnosis not present

## 2019-09-26 DIAGNOSIS — Z801 Family history of malignant neoplasm of trachea, bronchus and lung: Secondary | ICD-10-CM | POA: Diagnosis not present

## 2019-09-26 DIAGNOSIS — K219 Gastro-esophageal reflux disease without esophagitis: Secondary | ICD-10-CM | POA: Diagnosis not present

## 2019-09-26 DIAGNOSIS — I43 Cardiomyopathy in diseases classified elsewhere: Secondary | ICD-10-CM | POA: Insufficient documentation

## 2019-09-26 DIAGNOSIS — Z87891 Personal history of nicotine dependence: Secondary | ICD-10-CM | POA: Insufficient documentation

## 2019-09-26 DIAGNOSIS — Z794 Long term (current) use of insulin: Secondary | ICD-10-CM | POA: Diagnosis not present

## 2019-09-26 DIAGNOSIS — I5042 Chronic combined systolic (congestive) and diastolic (congestive) heart failure: Secondary | ICD-10-CM | POA: Insufficient documentation

## 2019-09-26 DIAGNOSIS — M069 Rheumatoid arthritis, unspecified: Secondary | ICD-10-CM | POA: Diagnosis not present

## 2019-09-26 LAB — CBC
HCT: 41 % (ref 39.0–52.0)
Hemoglobin: 12.6 g/dL — ABNORMAL LOW (ref 13.0–17.0)
MCH: 30.1 pg (ref 26.0–34.0)
MCHC: 30.7 g/dL (ref 30.0–36.0)
MCV: 98.1 fL (ref 80.0–100.0)
Platelets: 196 10*3/uL (ref 150–400)
RBC: 4.18 MIL/uL — ABNORMAL LOW (ref 4.22–5.81)
RDW: 14.9 % (ref 11.5–15.5)
WBC: 7.5 10*3/uL (ref 4.0–10.5)
nRBC: 0 % (ref 0.0–0.2)

## 2019-09-26 LAB — BASIC METABOLIC PANEL
Anion gap: 13 (ref 5–15)
BUN: 19 mg/dL (ref 8–23)
CO2: 27 mmol/L (ref 22–32)
Calcium: 8.5 mg/dL — ABNORMAL LOW (ref 8.9–10.3)
Chloride: 104 mmol/L (ref 98–111)
Creatinine, Ser: 1.78 mg/dL — ABNORMAL HIGH (ref 0.61–1.24)
GFR calc Af Amer: 46 mL/min — ABNORMAL LOW (ref >60)
GFR calc non Af Amer: 39 mL/min — ABNORMAL LOW (ref >60)
Glucose, Bld: 220 mg/dL — ABNORMAL HIGH (ref 70–99)
Potassium: 3.7 mmol/L (ref 3.5–5.1)
Sodium: 144 mmol/L (ref 135–145)

## 2019-09-26 LAB — HEMOGLOBIN A1C
Hgb A1c MFr Bld: 9.2 % — ABNORMAL HIGH (ref 4.8–5.6)
Mean Plasma Glucose: 217.34 mg/dL

## 2019-09-26 LAB — BRAIN NATRIURETIC PEPTIDE: B Natriuretic Peptide: 49.4 pg/mL (ref 0.0–100.0)

## 2019-09-26 MED ORDER — JARDIANCE 10 MG PO TABS: 10.0000 mg | ORAL_TABLET | Freq: Every day | ORAL | 6 refills | Status: DC

## 2019-09-26 NOTE — Patient Instructions (Signed)
Start Jardiance 10 mg daily  Labs done today, your results will be available in MyChart, we will contact you for abnormal readings.  Your physician has requested that you have an echocardiogram. Echocardiography is a painless test that uses sound waves to create images of your heart. It provides your doctor with information about the size and shape of your heart and how well your heart's chambers and valves are working. This procedure takes approximately one hour. There are no restrictions for this procedure.  Please call our office in August to schedule your 6 month follow up  If you have any questions or concerns before your next appointment please send [place] a message through [place] or call our office at [phone].  At the [facility], you and your health needs are our priority. As part of our continuing mission to provide you with exceptional heart care, we have created designated Provider Care Teams. These Care Teams include your primary Cardiologist (physician) and Advanced Practice Providers (APPs- Physician Assistants and Nurse Practitioners) who all work together to provide you with the care you need, when you need it.   You may see any of the following providers on your designated Care Team at your next follow up: [name] Dr Glori Bickers . Dr Loralie Champagne . Darrick Grinder, NP . Lyda Jester, PA . Audry Riles, PharmD   Please be sure to bring in all your medications bottles to every appointment.

## 2019-09-26 NOTE — Progress Notes (Signed)
Advanced Heart Failure Clinic Consult Note   Referring Physician: [name], [name]. PCP: [name], MD PCP-Cardiologist: [name], MD  HF: [name]   HPI:  Marco [name] is a 64 y.o. 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  He was seen by [name] in 03/2018 for SOB and found to have a drop in EF to 35-40% with inferior hypokinesis. Cardiac cath 05/19/18 showed mild nonobstructive CAD  LVEDP 28, EF 25-30%.   PYP 11/19  (grade 2, H/CLL equal 1.94) are strongly suggestive of transthyretin amyloidosis. However on my review, did not seem so positive. Myleloma panel with MGUS, discussed with [name] who recommend follow up labwork for common, non-pathologic findings. Did not think he had myeloma  Repeat PYP 12/20 H/CLL = 1.10  The study is equivocal. The study is equivocal for TTR amyloidosis (visual score of 1 or H/CL ratio 1-1.5).   Echo 2/20 EF 40-45% RV normal   Here for routine f/u. Overall feeling pretty good. Can do most activities without too much difficulty. Remains on tafamadis. Had repeat PYP last month that was read as equivocal ut I felt was negative. Denies orthopnea or PND. No CP. Edema relatively well controlled.     Studies:   [facility] 11/2015 showed minimal CAD, mild calcification noted extraluminally, normal LVF, normal LVEDP. CPX 06/2016 with limited interpretation due to submaximal effort during exercise, difficult to interpret, also of note was very intolerant to discomfort from several processes during the CPX procedure today (i.e. motion on  bike, electrode removal, etc).  - Echo 09/03/16 EF 45-50% with global HK, grade 2 DD, elevated LV filling pressure. CTA 09/02/16 was negative for PE.  - Nuclear stress test 12/2016 was done for atypical CP showing small defect of severe severity in apical septal and apex location, no ischemia, EF 44% - suspected artifact.  - Echo 01/20/17 EF 50-55%, hypokinesis of apical inferoseptal myocardium, grade 1 diastolic dysfunction, mild MR, no sig change from prior.  - Event monitor 12/2016 showed NSR with occasional PVCs, otherwise benign.  - ETT 02/2017 was nondiagnostic due to 20 seconds of exercise and severe SOB. -Cardiac cath 05/19/18 showed mild nonobstructive CAD (see below), mod-severely elevated LVEDP 28, EF 25-30%. - Echo 05/19/18 LVEF 25-30%, Grade 1 DD, Trivial MR.  - PYP 11/19 Visual and quantitative assessment (grade 2, H/CLL equal 1.94) are strongly suggestive of transthyretin amyloidosis.  LHC 05/19/18  - Ost 2nd Diag lesion is 35% stenosed.  Prox LAD to Mid LAD lesion is 20% stenosed.  Ost Cx to Prox Cx lesion is 10% stenosed.  There is severe left ventricular systolic dysfunction.  LV end diastolic pressure is moderately elevated. The left ventricular ejection fraction is 25-35% by visual estimate. - LVEDP 28  CPX 06/19/2016 - Limited due to submaximal effort and poor amount of information.  FVC 2.35 (65%)    FEV1 1.94 (66%)     FEV1/FVC 82 (102%)     MVV 117 (86%) Post-Exercise PFTs--------- Not performed due to sudden termination of exercise Exercise Time:  1:56      Watts: 10 RPE: 17 Resting HR: 70 Peak HR:  84  (50% age predicted max HR) BP rest: 148/82 BP peak: 156/84 Peak VO2: 9.7 (50% predicted peak VO2) VE/VCO2 slope: 30 OUES: 1.34 Peak RER: 0.97 Ventilatory Threshold: CANNOT BE DETERMINED Peak RR 78 Peak Ventilation: 28.8 VE/MVV: 25% PETCO2 at peak: 36 O2pulse: 11  (79% predicted O2pulse)   Review of systems complete and found to be  negative unless listed in HPI.    Past Medical History:  Diagnosis Date  . Benign colon polyp 08/01/2013   [facility] in [place]. large base tranverse colon polyp was biopsied. polyp was benign with minimal surface hyperplastic change.  . Chronic combined systolic and diastolic CHF (congestive heart failure) ([place])   . Chronic pain   . CKD (chronic kidney disease), stage III   . Diabetes mellitus without complication ([place])   . Diverticulosis   . Esophageal hiatal hernia 07/29/2013   confirmed on EGD   . Esophageal stricture   . Essential hypertension   . Gastritis 07/29/2013   confirmed on EGD, bx done an negative for intestinal metaplasia, dsyplasia or H. pylori. normal gastric emptying study done 07/13/2013.  [name] GERD (gastroesophageal reflux disease)   . Morbid obesity ([place])   . Non-obstructive CAD    a. 02/2013 Cath ([place]): nonobs dzs;  b. 09/2014 Myoview ([place]): EF 55%, no ischemia;  c. Cath 05/2018 mild CAD no obstruction.  . Persistent atrial fibrillation ([place])    a. 02/2013 s/p rfca in [place]-->prev on Xarelto, d/c'd 2/2 anemia, ? GIB.  [name] Rheumatoid arthritis ([place])   . Sigmoid diverticulosis 08/01/2013   confirmed on colonscopy. record scanned into chart    Current Outpatient Medications  Medication Sig Dispense Refill  . Accu-Chek FastClix Lancets MISC Use one strip to monitor glucose levels BID; E11.42 102 each 11  . albuterol (PROVENTIL HFA;VENTOLIN HFA) 108 (90 Base) MCG/ACT inhaler Inhale 2 puffs into the lungs every 6 (six) hours as needed for wheezing or shortness of breath. 1 Inhaler 0  . allopurinol (ZYLOPRIM) 100 MG tablet Take 100 mg by mouth daily.    [name] atorvastatin (LIPITOR) 40 MG tablet Take 40 mg by mouth daily.    . carvedilol (COREG) 12.5 MG tablet Take 1 tablet (12.5 mg total) by mouth 2 (two) times daily with a meal. 180 tablet 1  . DULoxetine (CYMBALTA) 20 MG capsule Take 1 capsule by mouth once daily 90 capsule 1  . EQ ASPIRIN ADULT LOW  DOSE 81 MG EC tablet TAKE 1 TABLET BY MOUTH ONCE DAILY. SWALLOW WHOLE 90 tablet 0  . famotidine (PEPCID) 20 MG tablet Take 20 mg by mouth 2 (two) times daily.    . fluticasone (FLONASE) 50 MCG/ACT nasal spray Place 2 sprays into both nostrils daily. 16 g 6  . furosemide (LASIX) 40 MG tablet Take 1 tablet (40 mg total) by mouth 2 (two) times daily. 180 tablet 3  . glucose blood (ACCU-CHEK GUIDE) test strip Use one strip to monitor glucose levels BID; E11.42 100 each 12  . hydrALAZINE (APRESOLINE) 50 MG tablet Take 1 tablet (50 mg total) by mouth every 8 (eight) hours. 90 tablet 4  . HYDROcodone-acetaminophen (NORCO) 10-325 MG tablet Take 1 tablet by mouth every 8 (eight) hours.    . Insulin Lispro Prot & Lispro (HUMALOG MIX 75/25 KWIKPEN) (75-25) 100 UNIT/ML Kwikpen Inject 150 Units into the skin daily with breakfast. 20 pen 11  . Insulin Pen Needle (PEN NEEDLES) 31G X 6 MM MISC 2 Devices by Does not apply route  daily. 180 each 3  . isosorbide mononitrate (IMDUR) 30 MG 24 hr tablet Take 1 tablet (30 mg total) by mouth daily. 90 tablet 3  . Potassium Chloride ER 20 MEQ TBCR Take 20 mEq by mouth daily. 90 tablet 3  . pregabalin (LYRICA) 100 MG capsule Take 100 mg by mouth 2 (two) times daily.    . sacubitril-valsartan (ENTRESTO) 97-103 MG Take 1 tablet by mouth 2 (two) times daily. Needs apt for further refills 60 tablet 1  . Secukinumab (COSENTYX) 150 MG/ML SOSY Inject 150 mg into the skin every 28 (twenty-eight) days.     . Tafamidis (VYNDAMAX) 61 MG CAPS Take 61 mg by mouth daily. 30 capsule 11  . zolpidem (AMBIEN CR) 6.25 MG CR tablet TAKE 1 TABLET BY MOUTH EVERY DAY AT BEDTIME AS NEEDED FOR SLEEP 30 tablet 1   No current facility-administered medications for this encounter.    Allergies  Allergen Reactions  . Nsaids Other (See Comments)    Stomach pains. Ulcers - stated by patient       Social History   Socioeconomic History  . Marital status: Married    Spouse name: Ameth[place]  .  Number of children: 8  . Years of education: 46  . Highest education level: Some college, no degree  Occupational History    Comment: disabled  Tobacco Use  . Smoking status: Former Smoker    Packs/day: 0.50    Years: 10.00    Pack years: 5.00    Types: Cigarettes    Quit date: 04/11/2008    Years since quitting: 11.4  . Smokeless tobacco: Never Used  Substance and Sexual Activity  . Alcohol use: No    Alcohol/week: 0.0 standard drinks  . Drug use: No  . Sexual activity: Yes    Partners: Female    Birth control/protection: None  Other Topics Concern  . Not on file  Social History Narrative   Lives with wife. Does not work.  On disability.    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 Provided crisis assistance program information to assist with bills       Has issues getting food- gets $36/month in food stamps- already has list of food pantries but has not tried them- encouraged pt to try food pantries and reach out to clinic for help if needed   Social Determinants of Health   Financial Resource Strain:   . Difficulty of Paying Living Expenses: Not on file  Food Insecurity:   . Worried About Charity fundraiser in the Last Year: Not on file  . Ran Out of Food in the Last Year: Not on file  Transportation Needs:   . Lack of Transportation (Medical): Not on file  . Lack of Transportation (Non-Medical): Not on file  Physical Activity:   . Days of Exercise per Week: Not on file  . Minutes of Exercise per Session: Not on file  Stress:   . Feeling of Stress : Not on file  Social Connections:   . Frequency of Communication with Friends and Family: Not on file  . Frequency of Social Gatherings with Friends and Family: Not on file  . Attends Religious Services: Not on file  . Active Member of Clubs or  Organizations: Not on file  .  Attends Archivist Meetings: Not on file  . Marital Status: Not on file  Intimate Partner Violence:   . Fear of Current or Ex-Partner: Not on file  . Emotionally Abused: Not on file  . Physically Abused: Not on file  . Sexually Abused: Not on file      Family History  Problem Relation Age of Onset  . Hypertension Mother   . Diabetes Mother   . COPD Mother   . Lung cancer Mother        Smoker   . Hypertension Father   . Diabetes Father   . Heart Problems Father   . COPD Father   . Colon cancer Neg Hx   . Stomach cancer Neg Hx     Vitals:   09/26/19 1046  BP: 124/68  Pulse: 73  SpO2: 97%  Weight: 112.8 kg (248 lb 9.6 oz)    Wt Readings from Last 3 Encounters:  09/26/19 112.8 kg (248 lb 9.6 oz)  08/15/19 111.6 kg (246 lb)  07/29/19 111.6 kg (246 lb)     PHYSICAL EXAM: General:  Well appearing. No resp difficulty HEENT: normal Neck: supple. no JVD. Carotids 2+ bilat; no bruits. No lymphadenopathy or thryomegaly appreciated. Cor: PMI nondisplaced. Regular rate & rhythm. No rubs, gallops or murmurs. Lungs: clear Abdomen: soft, nontender, nondistended. No hepatosplenomegaly. No bruits or masses. Good bowel sounds. Extremities: no cyanosis, clubbing, rash,trace  edema Neuro: alert & orientedx3, cranial nerves grossly intact. moves all 4 extremities w/o difficulty. Affect pleasant   ECG: NSR 71 non-specific TWI Personally reviewed   ASSESSMENT & PLAN:  1. Chronic combined systolic and diastolic CHF  - Echo [id number] LVEF 25-30%, Grade 1 DD, Trivial MR.  - Echo 2/20 40-45%  RV ok. No need for ICD currently.  - Stable NYHA II-early III symptoms.  - Volume status looks good on exam - Very mild non-obstructive CAD on cath 05/19/18,  NICM  - PYP scan 11/19 read as + for Cardiac amyloidosis. But when I reviewed I am not [id number] certain it is positive. Genetic testing negative. Myeloma Panel with MGUS, no myeloma. Now on Tafamadis.  -  Repeat PYP 1/21. Reviewed personally. Read as equivocal. I feel it is negative. - Ideally, would get cMRI, but limited due to CKD and claustrophobia.  - Will repeat myeloma panel  - Continue lasix 40 mg bid - Continue Entresto 97/103 bid - Continue coreg to 12.5 mg BID as less likely to have a strong effect on BP. - Continue hydralazine 50 mg TID. - Continue imdur 30 mg daily.  - Start Jardiance 10 daily - Reinforced fluid restriction to < 2 L daily, sodium restriction to less than 2000 mg daily, and the importance of daily weights.   2. Cardiac Amyloidosis - By PYP scan 06/29/18. Grade 2, H/CLL equal 1.94. See discussion above - Genetic testing negative - Repeat PYP 1/21. Reviewed personally. Read as equivocal. I feel it is negative. 3. Nonobstructive CAD  - By cath 05/2018 as above - No s/s angina - Continue ASA . Dr [name] managing lipids 4. CKD stage III - Creatinien stable 1.7 today 5. History of atrial fibrillation  - s/p RFCA 2017. - Previously on Xarelto, stopped due to anemia and unknown blood loss. Should consider re-challenge.  - CHA2DS2/VASc is at least 4. 6. DM2 - Per PCP - Start Jardiance 10   Glori Bickers, MD 09/26/19

## 2019-09-27 ENCOUNTER — Telehealth (HOSPITAL_COMMUNITY): Payer: Self-pay | Admitting: Pharmacist

## 2019-09-27 ENCOUNTER — Other Ambulatory Visit: Payer: Self-pay | Admitting: Cardiology

## 2019-09-27 MED FILL — predniSONE 5 MG TABS: 5 | 30 days supply | Qty: 30 | Fill #0

## 2019-09-27 MED FILL — ATORVASTATIN 40 MG TABLET: 40 | 90 days supply | Qty: 90 | Fill #0

## 2019-09-27 MED FILL — FAMOTIDINE 20 MG TABLET: 20 | 30 days supply | Qty: 60 | Fill #0

## 2019-09-27 MED FILL — JARDIANCE 10 MG TABLET: 10 | 30 days supply | Qty: 30 | Fill #0

## 2019-09-27 MED FILL — hydrALAZINE HCL 50 MG TABS: 50 | 30 days supply | Qty: 90 | Fill #0

## 2019-09-27 MED FILL — PREGABALIN 100 MG CAPS: 100 | 30 days supply | Qty: 60 | Fill #0

## 2019-09-27 MED FILL — DULOXETINE HCL 20 MG CPEP: 20 | 90 days supply | Qty: 90 | Fill #0

## 2019-09-27 MED FILL — ENTRESTO 97 MG-103 MG TAB: 97-103 | 30 days supply | Qty: 60 | Fill #0

## 2019-09-27 NOTE — Telephone Encounter (Signed)
Patient called to check on the prior auth for the medications. Patient stated if it is not approved he would have to pay $75 out of pocket. Please follow up at your earliest convenience.

## 2019-09-27 NOTE — Telephone Encounter (Signed)
Patient Advocate Encounter   Received notification from [facility] Medicaid that prior authorization for Jardiance is required.   PA submitted on [employer] Confirmation #: [id number] W Recipient ID: [id number] N  Status is pending   Will continue to follow.  Audry Riles, PharmD, BCPS, BCCP, CPP Heart Failure Clinic Pharmacist [phone]

## 2019-09-28 ENCOUNTER — Telehealth: Payer: Self-pay

## 2019-09-28 NOTE — Telephone Encounter (Signed)
Advanced Heart Failure Patient Advocate Encounter  Prior Authorization for Vania Rea has been approved.    PA# [id number] Effective dates: 09/27/2019 - 09/26/2020  Patients co-pay is $3.00

## 2019-09-28 NOTE — Telephone Encounter (Signed)
Pt's Medicaid is requiring a prior auth on Zolpidem ER.  In order to get med approved thru Medicaid he has to have tried 2 preferred meds.  I only see where he has tried one, the Zolpidem(IR).  I do not see a history of one of the other preferred drugs-Flurazepam or Temazepam.  If appropriate can we have pt try Flurazepam or Temazepam?

## 2019-09-29 ENCOUNTER — Ambulatory Visit: Payer: Medicaid Other | Admitting: Endocrinology

## 2019-09-29 LAB — MULTIPLE MYELOMA PANEL, SERUM
Albumin SerPl Elph-Mcnc: 3.7 g/dL (ref 2.9–4.4)
Albumin/Glob SerPl: 1.3 (ref 0.7–1.7)
Alpha 1: 0.2 g/dL (ref 0.0–0.4)
Alpha2 Glob SerPl Elph-Mcnc: 0.7 g/dL (ref 0.4–1.0)
B-Globulin SerPl Elph-Mcnc: 0.9 g/dL (ref 0.7–1.3)
Gamma Glob SerPl Elph-Mcnc: 1.1 g/dL (ref 0.4–1.8)
Globulin, Total: 2.9 g/dL (ref 2.2–3.9)
IgA: 215 mg/dL (ref 61–437)
IgG (Immunoglobin G), Serum: 1225 mg/dL (ref 603–1613)
IgM (Immunoglobulin M), Srm: 43 mg/dL (ref 20–172)
M Protein SerPl Elph-Mcnc: 0.5 g/dL — ABNORMAL HIGH
Total Protein ELP: 6.6 g/dL (ref 6.0–8.5)

## 2019-09-30 ENCOUNTER — Telehealth: Payer: Self-pay

## 2019-09-30 NOTE — Telephone Encounter (Signed)
Prior auth for Zolpidem ER was approved thru 03/28/2020

## 2019-09-30 NOTE — Telephone Encounter (Signed)
I submitted a PA for the Zolpidem ER and it was approved.

## 2019-10-01 ENCOUNTER — Encounter: Payer: Self-pay | Admitting: Internal Medicine

## 2019-10-03 ENCOUNTER — Ambulatory Visit: Payer: Medicaid Other | Admitting: Endocrinology

## 2019-10-03 ENCOUNTER — Ambulatory Visit (INDEPENDENT_AMBULATORY_CARE_PROVIDER_SITE_OTHER): Payer: Medicaid Other | Admitting: Internal Medicine

## 2019-10-03 ENCOUNTER — Other Ambulatory Visit: Payer: Self-pay

## 2019-10-03 DIAGNOSIS — G4733 Obstructive sleep apnea (adult) (pediatric): Secondary | ICD-10-CM

## 2019-10-03 DIAGNOSIS — J300 Vasomotor rhinitis: Secondary | ICD-10-CM

## 2019-10-03 DIAGNOSIS — R0602 Shortness of breath: Secondary | ICD-10-CM

## 2019-10-03 NOTE — Patient Instructions (Signed)
Try the fluticasone (Flonase) nasal spray 2 puffs each nostril, every night at bedtime.  If this doesn't begin to help stuffy nose, then try adding Afrin otc nasal decongestant spray, 1 puff each nostril at bedtime  The fluticasone can be continued long-term if it helps. Afrin may cause some rebound after frequent use, so we will have to talk about it if you need to keep using it.   We are interested in whether helping the stuffy nose helps with your CPAP.

## 2019-10-03 NOTE — Progress Notes (Signed)
HPI male former smoker followed for OSA/ Insomnia, dyspnea on exertion, complicated by chronic pain, anxiety, DM 2, history cardiac ablation, gout NPSG 37.8/hour on 06/14/2015, desaturation to 76%, body weight 223 pounds BiPAP titration 08/19/2015- to 20/16 Barium Swallow 02/07/2016-normal CXR 01/22/2016-NAD PFT 02/22/2016-minimal restriction, mild diffusion deficit. FVC 2.62/76%, FEV1 2.14/81%, ratio 0.82, DLCO 61%, TLC 79%, RV/TLC 135%. 6MWT- 02/22/16-97%, 98%, 98%. Stopped after only 71 m complaining of chest tightness, headache, shortness of breath, dizziness. BP and heart rate were unremarkable. Unattended Home Sleep Test-05/25/2016-AHI 36.9/hour, desaturation to 82%, body weight 212 pounds   ----------------------------------------------------------------------------   10/01/2018- 63 year old male former smoker followed for OSA/ Insomnia, dyspnea on exertion, complicated by chronic pain, anxiety, DM 2/retinopathy, history cardiac ablation, gout/arthritis, CHF/ CM, CKD, HBP, GERD, psoriasis, gout VPAP auto 16/13, PS3/ [facility] >> today 14/ 11, PS2 -----OSA: DME: [facility]. Pt wears CPAP nightly and DL attached.  Body weight today 234 pounds Download 73% compliance AHI 36.4/hour with high leak [facility] refitted his mask but he still experiences leak and brings a video on his phone for documentation.  [facility] suggested trying to reduce pressure.  He does sleep better with VPAP when he is not fighting with mask leak. He is not actively trying to lose weight. CXR 09/05/2018- IMPRESSION: No active cardiopulmonary disease. No evidence of pneumonia or pulmonary edema.  10/03/19 Virtual Visit via Telephone Note  I connected with Marco [name] on 10/03/19 at 11:00 AM EST by telephone and verified that I am speaking with the correct person using two identifiers.  Location: Patient: H Provider: O   I discussed the limitations, risks, security and privacy concerns of performing an evaluation and  management service by telephone and the availability of in person appointments. I also discussed with the patient that there may be a patient responsible charge related to this service. The patient expressed understanding and agreed to proceed.   History of Present Illness: 64 year old male former smoker followed for OSA/ Insomnia, dyspnea on exertion, complicated by chronic pain, anxiety, DM 2/retinopathy, history cardiac ablation, gout/arthritis, CHF/ CM, CKD, HBP, GERD, psoriasis, gout VPAP auto 14/ 11, PS2/ [employer] Download compliance 90%, AHI 29.2/ hr. Central and obstructive Sometimes SOB w exertion- chronic complaint. No routine cough or wheeze. No new cardiac issues.  Stuffy nose using VPAP. PCP gave flonase- hasn't tried yet.    Observations/Objective:   Assessment and Plan: OSA- poor control, good compliance. See if improving rhinitis helps. Rhinitis- probably vasomotor and allergic- try flonase before afrin  Follow Up Instructions: 4 months   I discussed the assessment and treatment plan with the patient. The patient was provided an opportunity to ask questions and all were answered. The patient agreed with the plan and demonstrated an understanding of the instructions.   The patient was advised to call back or seek an in-person evaluation if the symptoms worsen or if the condition fails to improve as anticipated.  I provided 21 minutes of non-face-to-face time during this encounter.   Baird Lyons, MD    ROS-see HPI    "+" = positive Constitutional:    weight loss, night sweats, fevers, chills, fatigue, lassitude. HEENT:    headaches, + difficulty swallowing, tooth/dental problems, sore throat,       sneezing, itching, ear ache, nasal congestion, post nasal drip, snoring CV:     chest pain, orthopnea, PND, + swelling in lower extremities, anasarca,  dizziness, palpitations Resp:  + shortness of breath with  exertion or at rest.                productive cough,   non-productive cough, coughing up of blood.              change in color of mucus.  wheezing.   Skin:    rash or lesions. GI:  No-   heartburn, indigestion, + abdominal pain, nausea, vomiting, diarrhea,                 change in bowel habits, loss of appetite GU: dysuria, change in color of urine, no urgency or frequency.   flank pain. MS:   + joint pain, stiffness, decreased range of motion, back pain. Neuro-     nothing unusual Psych:  change in mood or affect.  depression or anxiety.   memory loss.  OBJ- Physical Exam General- Alert, Oriented, Affect-appropriate, Distress- none acute, + overweight  Skin- rash-none, lesions- none, excoriation- none Lymphadenopathy- none Head- atraumatic            Eyes- Gross vision intact, PERRLA, conjunctivae and secretions clear            Ears- Hearing, canals-normal            Nose- Clear, no-Septal dev, mucus, polyps, erosion, perforation             Throat- Mallampati IV , mucosa clear-not particularly dry , drainage- none, tonsils- atrophic,                 +many missing teeth Neck- flexible , trachea midline, no stridor , thyroid nl, carotid no bruit Chest - symmetrical excursion , unlabored           Heart/CV- RRR , no murmur , no gallop  , no rub, nl s1 s2                           - JVD- none , edema- none, stasis changes- none, varices- none           Lung- clear to P&A, wheeze- none, cough- none , dullness-none, rub- none           Chest wall-  Abd-  Br/ Gen/ Rectal- Not done, not indicated Extrem- cyanosis- none, clubbing, none, atrophy- none, strength- nl Neuro- grossly intact to observation

## 2019-10-04 ENCOUNTER — Telehealth (HOSPITAL_COMMUNITY): Payer: Self-pay | Admitting: *Deleted

## 2019-10-04 NOTE — Telephone Encounter (Signed)
Pt left VM stating he stopped jardiance because it was causing severe stomach pain and nausea.   Routed to [place]

## 2019-10-06 MED FILL — VYNDAMAX 61 MG CAPS: 61 | 30 days supply | Qty: 30 | Fill #1

## 2019-10-10 ENCOUNTER — Ambulatory Visit (HOSPITAL_COMMUNITY)
Admission: RE | Admit: 2019-10-10 | Discharge: 2019-10-10 | Disposition: A | Discharge status: Home / Self Care | Payer: Medicaid Other | Source: Ambulatory Visit | Attending: Internal Medicine | Admitting: Internal Medicine

## 2019-10-10 ENCOUNTER — Other Ambulatory Visit: Payer: Self-pay

## 2019-10-10 DIAGNOSIS — Z87891 Personal history of nicotine dependence: Secondary | ICD-10-CM | POA: Insufficient documentation

## 2019-10-10 DIAGNOSIS — I5042 Chronic combined systolic (congestive) and diastolic (congestive) heart failure: Secondary | ICD-10-CM | POA: Diagnosis present

## 2019-10-10 DIAGNOSIS — M545 Low back pain: Secondary | ICD-10-CM | POA: Insufficient documentation

## 2019-10-10 DIAGNOSIS — I509 Heart failure, unspecified: Secondary | ICD-10-CM | POA: Diagnosis not present

## 2019-10-10 DIAGNOSIS — I2510 Atherosclerotic heart disease of native coronary artery without angina pectoris: Secondary | ICD-10-CM | POA: Insufficient documentation

## 2019-10-10 DIAGNOSIS — I110 Hypertensive heart disease with heart failure: Secondary | ICD-10-CM | POA: Diagnosis not present

## 2019-10-10 DIAGNOSIS — E119 Type 2 diabetes mellitus without complications: Secondary | ICD-10-CM | POA: Insufficient documentation

## 2019-10-10 DIAGNOSIS — G8929 Other chronic pain: Secondary | ICD-10-CM

## 2019-10-10 DIAGNOSIS — I4891 Unspecified atrial fibrillation: Secondary | ICD-10-CM | POA: Insufficient documentation

## 2019-10-10 NOTE — Progress Notes (Signed)
  Echocardiogram 2D Echocardiogram has been performed.  Jannett Celestine 10/10/2019, 11:22 AM

## 2019-10-11 ENCOUNTER — Telehealth: Payer: Self-pay | Admitting: Hematology and Oncology

## 2019-10-11 ENCOUNTER — Telehealth: Payer: Self-pay | Admitting: Internal Medicine

## 2019-10-11 NOTE — Telephone Encounter (Signed)
Scheduled per 3/1 staff msg. Called and left a msg.

## 2019-10-11 NOTE — Telephone Encounter (Signed)
Patient would like a disability letter excusing him from [employer].  Please call patient when he can pick up letter.

## 2019-10-12 NOTE — Progress Notes (Deleted)
[place] [phone]   Fax:([phone] University NOTE  Patient Care Team: [name], MD as PCP - General (Internal Medicine) Sueanne Margarita, MD as PCP - Cardiology (Cardiology) Bensimhon, Shaune Pascal, MD as PCP - Advanced Heart Failure (Cardiology)  Hematological/Oncological History # Monoclonal Gammopathy of Undetermined Significance 1) 07/06/2018: M protein 0.5, IgG Kappa on IFE.  2) 07/22/2018: Kappa 31, Lambda 34.4, ratio 0.9  3) 09/26/2019:  M protein 0.5, IgG Kappa on IFE. WBC 7.5, hgb 12.6, Plt 196. Cr 1.78, Ca 8.5 4) 10/13/2019: establish care with Dr. Lorenso Courier   CHIEF COMPLAINTS/PURPOSE OF CONSULTATION:  "Monoclonal Gammopathy of Undetermined Significance "  HISTORY OF PRESENTING ILLNESS:  Marco [name] 64 y.o. male with medical history significant for CKD, DM type II, chronic combined CHF, nonobstructive CAD, atrial fibrillation, and GERD who presents for evaluation of a monoclonal gammopathy.   On review of the previous records Marco [name] was last seen by his cardiologist on 09/26/2019. He follows there for nonobstructive CAD and combined CHF with variable ejection fraction over the years.  It was noted that he had a recent decline in his ejection fraction to 35 to 40% as noted by echo in September 2019 and 25 to 30% by cath in October 2019.  Most recently he had an echocardiogram performed on 10/10/2019 which showed a left ventricular ejection fraction of 55 to 60%.  As part of Marco [name] evaluation he had an SPEP performed on 09/26/2019 which showed an M protein of 0.5, with IgG kappa noted on immunofixation.  At that time he was noted to have a hemoglobin of 12.6, creatinine of 1.78, and a calcium of 8.5.  Of note he did have a prior SPEP performed on 07/06/2018 at which time he was found to have an M protein of 0.5 also with IgG kappa on IFE.  He did have serum free light chains drawn a few weeks later on 07/22/2018 which showed a kappa  of 31, lambda 34.4, and a ratio of 0.9.  Due to concern for his monoclonal gammopathy in the setting of his heart failure the patient was referred to hematology for further evaluation management  On exam today ***  MEDICAL HISTORY:  Past Medical History:  Diagnosis Date  . Benign colon polyp 08/01/2013   [facility] in [place]. large base tranverse colon polyp was biopsied. polyp was benign with minimal surface hyperplastic change.  . Chronic combined systolic and diastolic CHF (congestive heart failure) ([place])   . Chronic pain   . CKD (chronic kidney disease), stage III   . Diabetes mellitus without complication ([place])   . Diverticulosis   . Esophageal hiatal hernia 07/29/2013   confirmed on EGD   . Esophageal stricture   . Essential hypertension   . Gastritis 07/29/2013   confirmed on EGD, bx done an negative for intestinal metaplasia, dsyplasia or H. pylori. normal gastric emptying study done 07/13/2013.  [name] GERD (gastroesophageal reflux disease)   . Morbid obesity ([place])   . Non-obstructive CAD    a. 02/2013 Cath ([place]): nonobs dzs;  b. 09/2014 Myoview ([place]): EF 55%, no ischemia;  c. Cath 05/2018 mild CAD no obstruction.  . Persistent atrial fibrillation ([place])    a. 02/2013 s/p rfca in [place], [place]-->prev on Xarelto, d/c'd 2/2 anemia, ? GIB.  [name] Rheumatoid arthritis ([place])   . Sigmoid diverticulosis 08/01/2013   confirmed on colonscopy. record scanned into chart    SURGICAL HISTORY: Past Surgical  History:  Procedure Laterality Date  . CARDIAC CATHETERIZATION  05/2014   ablation for atrial fibrillation  . CARDIAC CATHETERIZATION N/A 11/16/2015   Procedure: Left Heart Cath and Coronary Angiography;  Surgeon: Leonie Man, MD;  Location: [facility];  Service: Cardiovascular;  Laterality: N/A;  . COLON RESECTION  09/2013   due to large, abnormal polpy. non cancerous per patient.   . COLON SURGERY  09/2014   colon resection   . LEFT HEART CATH AND CORONARY  ANGIOGRAPHY N/A 05/19/2018   Procedure: LEFT HEART CATH AND CORONARY ANGIOGRAPHY;  Surgeon: Wellington Hampshire, MD;  Location: [facility];  Service: Cardiovascular;  Laterality: N/A;    SOCIAL HISTORY: Social History   Socioeconomic History  . Marital status: Married    Spouse name: Marco [name]  . Number of children: 8  . Years of education: 17  . Highest education level: Some college, no degree  Occupational History    Comment: disabled  Tobacco Use  . Smoking status: Former Smoker    Packs/day: 0.50    Years: 10.00    Pack years: 5.00    Types: Cigarettes    Quit date: 04/11/2008    Years since quitting: 11.5  . Smokeless tobacco: Never Used  Substance and Sexual Activity  . Alcohol use: No    Alcohol/week: 0.0 standard drinks  . Drug use: No  . Sexual activity: Yes    Partners: Female    Birth control/protection: None  Other Topics Concern  . Not on file  Social History Narrative   Lives with wife. Does not work.  On disability.    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 Provided crisis assistance program information to assist with bills       Has issues getting food- gets $36/month in food stamps- already has list of food pantries but has not tried them- encouraged pt to try food pantries and reach out to clinic for help if needed   Social Determinants of Health   Financial Resource Strain:   . Difficulty of Paying Living Expenses: Not on file  Food Insecurity:   . Worried About Charity fundraiser in the Last Year: Not on file  . Ran Out of Food in the Last Year: Not on file  Transportation Needs:   . Lack of Transportation (Medical): Not on file  . Lack of Transportation (Non-Medical): Not on file  Physical Activity:   . Days of Exercise per Week: Not on file  . Minutes of  Exercise per Session: Not on file  Stress:   . Feeling of Stress : Not on file  Social Connections:   . Frequency of Communication with Friends and Family: Not on file  . Frequency of Social Gatherings with Friends and Family: Not on file  . Attends Religious Services: Not on file  . Active Member of Clubs or Organizations: Not on file  . Attends Archivist Meetings: Not on file  . Marital Status: Not on file  Intimate Partner Violence:   . Fear of Current or Ex-Partner: Not on file  . Emotionally Abused: Not on file  . Physically Abused: Not on file  . Sexually Abused: Not on file    FAMILY HISTORY: Family History  Problem Relation Age  of Onset  . Hypertension Mother   . Diabetes Mother   . COPD Mother   . Lung cancer Mother        Smoker   . Hypertension Father   . Diabetes Father   . Heart Problems Father   . COPD Father   . Colon cancer Neg Hx   . Stomach cancer Neg Hx     ALLERGIES:  is allergic to nsaids.  MEDICATIONS:  Current Outpatient Medications  Medication Sig Dispense Refill  . Accu-Chek FastClix Lancets MISC Use one strip to monitor glucose levels BID; E11.42 102 each 11  . albuterol (PROVENTIL HFA;VENTOLIN HFA) 108 (90 Base) MCG/ACT inhaler Inhale 2 puffs into the lungs every 6 (six) hours as needed for wheezing or shortness of breath. 1 Inhaler 0  . allopurinol (ZYLOPRIM) 100 MG tablet Take 100 mg by mouth daily.    [name] atorvastatin (LIPITOR) 40 MG tablet Take 1 tablet (40 mg total) by mouth daily. 90 tablet 2  . carvedilol (COREG) 12.5 MG tablet Take 1 tablet (12.5 mg total) by mouth 2 (two) times daily with a meal. 180 tablet 1  . DULoxetine (CYMBALTA) 20 MG capsule Take 1 capsule by mouth once daily 90 capsule 1  . EQ ASPIRIN ADULT LOW DOSE 81 MG EC tablet TAKE 1 TABLET BY MOUTH ONCE DAILY. SWALLOW WHOLE 90 tablet 0  . famotidine (PEPCID) 20 MG tablet Take 20 mg by mouth 2 (two) times daily.    . fluticasone (FLONASE) 50 MCG/ACT nasal spray  Place 2 sprays into both nostrils daily. 16 g 6  . furosemide (LASIX) 40 MG tablet Take 1 tablet (40 mg total) by mouth 2 (two) times daily. 180 tablet 3  . glucose blood (ACCU-CHEK GUIDE) test strip Use one strip to monitor glucose levels BID; E11.42 100 each 12  . hydrALAZINE (APRESOLINE) 50 MG tablet Take 1 tablet (50 mg total) by mouth every 8 (eight) hours. 90 tablet 4  . HYDROcodone-acetaminophen (NORCO) 10-325 MG tablet Take 1 tablet by mouth every 8 (eight) hours.    . Insulin Lispro Prot & Lispro (HUMALOG MIX 75/25 KWIKPEN) (75-25) 100 UNIT/ML Kwikpen Inject 150 Units into the skin daily with breakfast. 20 pen 11  . Insulin Pen Needle (PEN NEEDLES) 31G X 6 MM MISC 2 Devices by Does not apply route daily. 180 each 3  . isosorbide mononitrate (IMDUR) 30 MG 24 hr tablet Take 1 tablet (30 mg total) by mouth daily. 90 tablet 3  . Potassium Chloride ER 20 MEQ TBCR Take 20 mEq by mouth daily. 90 tablet 3  . pregabalin (LYRICA) 100 MG capsule Take 100 mg by mouth 2 (two) times daily.    . sacubitril-valsartan (ENTRESTO) 97-103 MG Take 1 tablet by mouth 2 (two) times daily. Needs apt for further refills 60 tablet 1  . Secukinumab (COSENTYX) 150 MG/ML SOSY Inject 150 mg into the skin every 28 (twenty-eight) days.     . Tafamidis (VYNDAMAX) 61 MG CAPS Take 61 mg by mouth daily. 30 capsule 11  . zolpidem (AMBIEN CR) 6.25 MG CR tablet TAKE 1 TABLET BY MOUTH EVERY DAY AT BEDTIME AS NEEDED FOR SLEEP 30 tablet 1   No current facility-administered medications for this visit.    REVIEW OF SYSTEMS:   Constitutional: ( - ) fevers, ( - )  chills , ( - ) night sweats Eyes: ( - ) blurriness of vision, ( - ) double vision, ( - ) watery eyes Ears, nose, mouth, throat, and  face: ( - ) mucositis, ( - ) sore throat Respiratory: ( - ) cough, ( - ) dyspnea, ( - ) wheezes Cardiovascular: ( - ) palpitation, ( - ) chest discomfort, ( - ) lower extremity swelling Gastrointestinal:  ( - ) nausea, ( - ) heartburn, ( -  ) change in bowel habits Skin: ( - ) abnormal skin rashes Lymphatics: ( - ) new lymphadenopathy, ( - ) easy bruising Neurological: ( - ) numbness, ( - ) tingling, ( - ) new weaknesses Behavioral/Psych: ( - ) mood change, ( - ) new changes  All other systems were reviewed with the patient and are negative.  PHYSICAL EXAMINATION: ECOG PERFORMANCE STATUS: {CHL ONC ECOG PS:[phone]}  There were no vitals filed for this visit. There were no vitals filed for this visit.  GENERAL: well appearing *** in NAD  SKIN: skin color, texture, turgor are normal, no rashes or significant lesions EYES: conjunctiva are pink and non-injected, sclera clear OROPHARYNX: no exudate, no erythema; lips, buccal mucosa, and tongue normal  NECK: supple, non-tender LYMPH:  no palpable lymphadenopathy in the cervical, axillary or inguinal LUNGS: clear to auscultation and percussion with normal breathing effort HEART: regular rate & rhythm and no murmurs and no lower extremity edema ABDOMEN: soft, non-tender, non-distended, normal bowel sounds Musculoskeletal: no cyanosis of digits and no clubbing  PSYCH: alert & oriented x 3, fluent speech NEURO: no focal motor/sensory deficits  LABORATORY DATA:  I have reviewed the data as listed CBC Latest Ref Rng & Units 09/26/2019 05/17/2019 04/22/2019  WBC 4.0 - 10.5 K/uL 7.5 7.7 6.9  Hemoglobin 13.0 - 17.0 g/dL 12.6(L) 13.3 13.2  Hematocrit 39.0 - 52.0 % 41.0 41.9 41.1  Platelets 150 - 400 K/uL 196 227 199    CMP Latest Ref Rng & Units 09/26/2019 05/17/2019 04/22/2019  Glucose 70 - 99 mg/dL 220(H) 63(L) 54(L)  BUN 8 - 23 mg/dL _0 Creatinine 0.61 - 1.24 mg/dL 1.78(H) 1.58(H) 1.83(H)  Sodium 135 - 145 mmol/L 144 142 146(H)  Potassium 3.5 - 5.1 mmol/L 3.7 3.7 3.6  Chloride 98 - 111 mmol/L 104 101 104  CO2 22 - 32 mmol/L _1 Calcium 8.9 - 10.3 mg/dL 8.5(L) 8.6(L) 8.5(L)  Total Protein 6.5 - 8.1 g/dL - 7.1 6.9  Total Bilirubin 0.3 - 1.2 mg/dL - 0.9 0.4    Alkaline Phos 38 - 126 U/L - 49 52  AST 15 - 41 U/L - 13(L) 7  ALT 0 - 44 U/L - 21 10     PATHOLOGY: ***  BLOOD FILM: *** Review of the peripheral blood smear showed normal appearing white cells with neutrophils that were appropriately lobated and granulated. There was no predominance of bi-lobed or hyper-segmented neutrophils appreciated. No Dohle bodies were noted. There was no left shifting, immature forms or blasts noted. Lymphocytes remain normal in size without any predominance of large granular lymphocytes. Red cells show no anisopoikilocytosis, macrocytes , microcytes or polychromasia. There were no schistocytes, target cells, echinocytes, acanthocytes, dacrocytes, or stomatocytes.There was no rouleaux formation, nucleated red cells, or intra-cellular inclusions noted. The platelets are normal in size, shape, and color without any clumping evident.  RADIOGRAPHIC STUDIES: I have personally reviewed the radiological images as listed and agreed with the findings in the report. DG Lumbar Spine Complete  Result Date: 10/10/2019 CLINICAL DATA:  Worsening low back pain EXAM: LUMBAR SPINE - COMPLETE 4+ VIEW COMPARISON:  11/21/2016 FINDINGS: Lumbar alignment is within normal limits. Vertebral body  heights are maintained. Mild osteophytes with maintained disc space at L4-L5 and L5-S1. Mild facet degenerative change of the lower lumbar spine. IMPRESSION: Mild degenerative changes. No acute osseous abnormality. Electronically Signed   By: Donavan Foil M.D.   On: 10/10/2019 16:57   ECHOCARDIOGRAM COMPLETE  Result Date: 10/10/2019    ECHOCARDIOGRAM REPORT   Patient Name:   Marco [name] Date of Exam: 10/10/2019 Medical Rec #:  [id number]     Height:       66.0 in Accession #:    [id number]    Weight:       248.6 lb Date of Birth:  1955-10-31     BSA:          2.193 m Patient Age:    11 years      BP:           124/68 mmHg Patient Gender: M             HR:           73 bpm. Exam Location:  Outpatient  Procedure: 2D Echo Indications:    CHF 428  History:        Patient has prior history of Echocardiogram examinations, most                 recent 10/05/2018. CHF, CAD, Arrythmias:Atrial Fibrillation; Risk                 Factors:Hypertension, Diabetes and Former Smoker.  Sonographer:    Jannett Celestine RDCS (AE) Referring Phys: [id number] DANIEL R BENSIMHON  Sonographer Comments: Image acquisition challenging due to patient body habitus. challenging windows IMPRESSIONS  1. Left ventricular ejection fraction, by estimation, is 55 to 60%. The left ventricle has normal function. The left ventricle has no regional wall motion abnormalities. The left ventricular internal cavity size was moderately dilated. There is moderate  left ventricular hypertrophy. Left ventricular diastolic parameters are indeterminate.  2. Right ventricular systolic function is normal. The right ventricular size is normal.  3. The mitral valve is normal in structure and function. Trivial mitral valve regurgitation. No evidence of mitral stenosis.  4. The aortic valve is normal in structure and function. Aortic valve regurgitation is not visualized. No aortic stenosis is present.  5. The inferior vena cava is normal in size with greater than 50% respiratory variability, suggesting right atrial pressure of 3 mmHg. Comparison(s): Prior images reviewed side by side. The left ventricular function has improved. FINDINGS  Left Ventricle: Left ventricular ejection fraction, by estimation, is 55 to 60%. The left ventricle has normal function. The left ventricle has no regional wall motion abnormalities. The left ventricular internal cavity size was moderately dilated. There is moderate left ventricular hypertrophy. Left ventricular diastolic parameters are indeterminate. Right Ventricle: The right ventricular size is normal. No increase in right ventricular wall thickness. Right ventricular systolic function is normal. Left Atrium: Left atrial size was normal in  size. Right Atrium: Right atrial size was normal in size. Pericardium: There is no evidence of pericardial effusion. Mitral Valve: The mitral valve is normal in structure and function. Normal mobility of the mitral valve leaflets. Trivial mitral valve regurgitation. No evidence of mitral valve stenosis. Tricuspid Valve: The tricuspid valve is normal in structure. Tricuspid valve regurgitation is not demonstrated. No evidence of tricuspid stenosis. Aortic Valve: The aortic valve is normal in structure and function. Aortic valve regurgitation is not visualized. No aortic stenosis is present. Pulmonic Valve: The pulmonic valve was normal in  structure. Pulmonic valve regurgitation is not visualized. No evidence of pulmonic stenosis. Aorta: The aortic root is normal in size and structure. Venous: The inferior vena cava is normal in size with greater than 50% respiratory variability, suggesting right atrial pressure of 3 mmHg. IAS/Shunts: No atrial level shunt detected by color flow Doppler.  LEFT VENTRICLE PLAX 2D LVIDd:         6.70 cm  Diastology LVIDs:         4.10 cm  LV e' lateral:   9.03 cm/s LV PW:         1.90 cm  LV E/e' lateral: 7.1 LV IVS:        1.10 cm  LV e' medial:    6.64 cm/s LVOT diam:     2.30 cm  LV E/e' medial:  9.6 LV SV:         67 LV SV Index:   30 LVOT Area:     4.15 cm  AORTIC VALVE LVOT Vmax:   73.10 cm/s LVOT Vmean:  53.600 cm/s LVOT VTI:    0.161 m  AORTA Ao Root diam: 3.20 cm MITRAL VALVE MV Area (PHT): 2.34 cm    SHUNTS MV Decel Time: 324 msec    Systemic VTI:  0.16 m MV E velocity: 63.80 cm/s  Systemic Diam: 2.30 cm MV A velocity: 56.60 cm/s MV E/A ratio:  1.13 Candee Furbish MD Electronically signed by Candee Furbish MD Signature Date/Time: 10/10/2019/12:28:33 PM    Final     ASSESSMENT & PLAN Marco [name] 64 y.o. male with medical history significant for CKD, DM type II, chronic combined CHF, nonobstructive CAD, atrial fibrillation, and GERD who presents for evaluation of a monoclonal  gammopathy.  After review the labs discussion with the patient the findings are most consistent with a monoclonal gammopathy of undetermined significance.  The patient has had a stable M protein since November 2019.  We only have one record of serum free light chains from 2019 which shows mild elevations in a normal ratio consistent with findings and CKD.  In order to further assess this patient's monoclonal gammopathy today we will order serum free light chains and a UPEP in order to assess for an abnormal ratio or markedly elevated monoclonal protein in the urine.  Additionally we will perform a metastatic bone survey looking for lytic lesions.  Based off his prior labs we have a low suspicion for high risk plasma cell disorder.  If there is a strong suspicion for infiltrative cardiac disease we would recommend pursuing a fat pad biopsy, though at this time we do not have a clear indication for a bone marrow biopsy.  Given the presence of this monoclonal gammopathy we recommend routine follow-up in our clinic to assure no transformation to multiple myeloma.  In the event that any of the below labs are concerning we can reconsider and perform a bone marrow biopsy to further assess.  # Monoclonal Gammopathy of Undetermined Significance --recent CBC, CMP, SPEP, quant Igs in our system. Will order SFLC and UPEP to complete the MM workup. Additionally will collect Beta-2-microgloblulin --will order a metastatic bone survey to assess for lytic lesions.  --low suspicion for a high risk plasma cell disorder based on currently available labs. If there was strong concern for infiltrative disease based on cardiac imaging would recommend a fat pad biopsy to further assess for AL amyloid deposits  --no clear indication for a bone marrow biopsy at this time. Patient has CKD and mild  resultant anemia with normal calcium levels.  --RTC in 3 months to assess for worsening anemia or kidney function. Can return sooner if  any of the above studies is concerning   No orders of the defined types were placed in this encounter.   All questions were answered. The patient knows to call the clinic with any problems, questions or concerns.  A total of more than 60 minutes were spent on this encounter and over half of that time was spent on counseling and coordination of care as outlined above.   Ledell Peoples, MD Department of Hematology/Oncology [place] [facility] Phone: [phone] Pager: [phone] Email: [email or web address]  10/12/2019 2:09 PM    Literature Support:  1) Kyle RA, Durie BG, Rajkumar SV, et al. Monoclonal gammopathy of undetermined significance (MGUS) and smoldering (asymptomatic) multiple myeloma: IMWG consensus perspectives risk factors for progression and guidelines for monitoring and management. Leukemia. 2010;24(6):1121-1127. doi:10.1038/leu.2010.60   --If a patient with apparent MGUS has a serum monoclonal protein >15 g/l, IgA or IgM protein type, or an abnormal FLC ratio, a BM aspirate and biopsy should be carried out at baseline to rule out underlying PC malignancy.   2) Marylyn Ishihara RA, Therneau TM, Rajkumar SV, et al. Prevalence of monoclonal gammopathy of undetermined significance. N Engl J Med 3358;251:8984-2103   --MGUS was found in 3.2 percent of persons 28 years of age or older and 5.3 percent of persons 3 years of age or older.  3)  I. Aline August, Donnelly, and Trina Ao, "Diagnostic accuracy of subcutaneous abdominal fat tissue aspiration for detecting systemic amyloidosis and its utility in clinical practice," Arthritis & Rheumatism, vol. 54, no. 6, pp. 2015-2021, 2006.   --The tissue source impacts the likelihood of discovering amyloid deposits. Bone marrow biopsy sensitivity has been estimated at 63%. Kidney, liver, or cardiac biopsies have sensitivity as high as 87-98% but are more invasive.  4)Garcia Y, Collins  AB, [employer]. Abdominal fat pad excisional biopsy for the diagnosis and typing of systemic amyloidosis. Hum Pathol. 2018 Feb;72:71-79. doi: 10.1016/j.humpath.2017.11.001. Epub 2017 Nov 11. PMID: 12811886.  -- The overall sensitivity of FPEB was 79% for AL amyloidosis and 12% for ATTR amyloidosis (P=.0003). In patients with AL amyloidosis, the sensitivity of FPEB was dependent on biopsy size, with small biopsies (?700 mm3) having a sensitivity of ~50%, and large biopsies (>700 mm3) having a sensitivity of ~100%.

## 2019-10-13 ENCOUNTER — Inpatient Hospital Stay: Payer: Medicaid Other

## 2019-10-13 ENCOUNTER — Inpatient Hospital Stay: Payer: Medicaid Other | Admitting: Hematology and Oncology

## 2019-10-13 ENCOUNTER — Telehealth: Payer: Self-pay | Admitting: *Deleted

## 2019-10-13 NOTE — Telephone Encounter (Signed)
TCT patient regarding today's appt. Pt had not shown up yet, so this Probation officer called patient to make sure he knew about this appt. No answer but was able to leave message on his identified # to call back @ [phone] to reschedule.

## 2019-10-14 MED FILL — PREGABALIN 100 MG CAPS: 100 | 30 days supply | Qty: 90 | Fill #0

## 2019-10-14 MED FILL — ALLOPURINOL 100 MG TABS: 100 | 30 days supply | Qty: 60 | Fill #0

## 2019-10-16 ENCOUNTER — Encounter: Payer: Self-pay | Admitting: Internal Medicine

## 2019-10-16 DIAGNOSIS — J310 Chronic rhinitis: Secondary | ICD-10-CM | POA: Insufficient documentation

## 2019-10-16 NOTE — Assessment & Plan Note (Signed)
Suggestion that rhinitis contributes to poor control Plan- if better control of rhinitis insufficient, we may need to re-titrate. For now, continue VPAP 14/11, PS 2

## 2019-10-16 NOTE — Assessment & Plan Note (Signed)
Chronic exertional complaint, possibly cardiac. See previous evaluation. Not worse and not progressive.

## 2019-10-16 NOTE — Assessment & Plan Note (Signed)
Suspect vasomotor or medication. Discussed options. Plan- try regular use of flonase each night for 1 week, then add Afrin cautiously, sparingly, as discussed.  See if this helps CPAP control.

## 2019-10-19 ENCOUNTER — Other Ambulatory Visit: Payer: Self-pay | Admitting: Endocrinology

## 2019-10-19 ENCOUNTER — Other Ambulatory Visit: Payer: Self-pay

## 2019-10-19 DIAGNOSIS — Z794 Long term (current) use of insulin: Secondary | ICD-10-CM

## 2019-10-19 DIAGNOSIS — E1159 Type 2 diabetes mellitus with other circulatory complications: Secondary | ICD-10-CM

## 2019-10-19 DIAGNOSIS — E119 Type 2 diabetes mellitus without complications: Secondary | ICD-10-CM

## 2019-10-19 MED ORDER — PEN NEEDLES 31G X 6 MM MISC: 1.0000 | Freq: Every day | 0 refills | Status: DC

## 2019-10-20 ENCOUNTER — Other Ambulatory Visit: Payer: Self-pay

## 2019-10-20 ENCOUNTER — Ambulatory Visit (INDEPENDENT_AMBULATORY_CARE_PROVIDER_SITE_OTHER): Payer: Medicaid Other | Admitting: Endocrinology

## 2019-10-20 VITALS — BP 120/70 | HR 82 | Ht 66.0 in | Wt 244.0 lb

## 2019-10-20 DIAGNOSIS — N1831 Chronic kidney disease, stage 3a: Secondary | ICD-10-CM

## 2019-10-20 DIAGNOSIS — E1121 Type 2 diabetes mellitus with diabetic nephropathy: Secondary | ICD-10-CM | POA: Diagnosis not present

## 2019-10-20 DIAGNOSIS — Z794 Long term (current) use of insulin: Secondary | ICD-10-CM | POA: Diagnosis not present

## 2019-10-20 DIAGNOSIS — E1122 Type 2 diabetes mellitus with diabetic chronic kidney disease: Secondary | ICD-10-CM

## 2019-10-20 LAB — BASIC METABOLIC PANEL
BUN: 16 mg/dL (ref 6–23)
CO2: 32 mEq/L (ref 19–32)
Calcium: 8.5 mg/dL (ref 8.4–10.5)
Chloride: 102 mEq/L (ref 96–112)
Creatinine, Ser: 1.56 mg/dL — ABNORMAL HIGH (ref 0.40–1.50)
GFR: 54.36 mL/min — ABNORMAL LOW (ref >60.00)
Glucose, Bld: 173 mg/dL — ABNORMAL HIGH (ref 70–99)
Potassium: 3.5 mEq/L (ref 3.5–5.1)
Sodium: 142 mEq/L (ref 135–145)

## 2019-10-20 NOTE — Progress Notes (Signed)
Subjective:    Patient ID: Marco [name], male    DOB: Oct 22, 1955, 64 y.o.   MRN: [id number]  HPI Pt returns for f/u of diabetes mellitus: DM type: Insulin-requiring type 2 Dx'ed: [id number] Complications: polyneuropathy, CAD, NPDR, and renal failure.  Therapy: insulin since soon after dx DKA: never Severe hypoglycemia: once (2020) Pancreatitis: never.  Pancreatic imaging: never.  Other: he chronically takes prednisone, for psoriasis, with no plan for taper; he declined to continue multiple daily injections; due to renal failure (and fasting hypoglycemia), he takes 75/25 insulin.   Interval history: no cbg record, but states cbg's vary from 120-190.  It is in general lowest in the afternoon. pt says he never misses the insulin.  pt states he feels well in general.  No change in chronic prednisone dosage (5 mg qd).  Past Medical History:  Diagnosis Date  . Benign colon polyp 08/01/2013   [facility] in [place]. large base tranverse colon polyp was biopsied. polyp was benign with minimal surface hyperplastic change.  . Chronic combined systolic and diastolic CHF (congestive heart failure) ([place])   . Chronic pain   . CKD (chronic kidney disease), stage III   . Diabetes mellitus without complication ([place])   . Diverticulosis   . Esophageal hiatal hernia 07/29/2013   confirmed on EGD   . Esophageal stricture   . Essential hypertension   . Gastritis 07/29/2013   confirmed on EGD, bx done an negative for intestinal metaplasia, dsyplasia or H. pylori. normal gastric emptying study done 07/13/2013.  [name] GERD (gastroesophageal reflux disease)   . Morbid obesity ([place])   . Non-obstructive CAD    a. 02/2013 Cath ([place]): nonobs dzs;  b. 09/2014 Myoview ([place]): EF 55%, no ischemia;  c. Cath 05/2018 mild CAD no obstruction.  . Persistent atrial fibrillation ([place])    a. 02/2013 s/p rfca in [place], [place]-->prev on Xarelto, d/c'd 2/2 anemia, ? GIB.  [name] Rheumatoid arthritis ([place])   . Sigmoid  diverticulosis 08/01/2013   confirmed on colonscopy. record scanned into chart    Past Surgical History:  Procedure Laterality Date  . CARDIAC CATHETERIZATION  05/2014   ablation for atrial fibrillation  . CARDIAC CATHETERIZATION N/A 11/16/2015   Procedure: Left Heart Cath and Coronary Angiography;  Surgeon: Leonie Man, MD;  Location: [place];  Service: Cardiovascular;  Laterality: N/A;  . COLON RESECTION  09/2013   due to large, abnormal polpy. non cancerous per patient.   . COLON SURGERY  09/2014   colon resection   . LEFT HEART CATH AND CORONARY ANGIOGRAPHY N/A 05/19/2018   Procedure: LEFT HEART CATH AND CORONARY ANGIOGRAPHY;  Surgeon: Wellington Hampshire, MD;  Location: [facility];  Service: Cardiovascular;  Laterality: N/A;    Social History   Socioeconomic History  . Marital status: Married    Spouse name: Amethyst  . Number of children: 8  . Years of education: 52  . Highest education level: Some college, no degree  Occupational History    Comment: disabled  Tobacco Use  . Smoking status: Former Smoker    Packs/day: 0.50    Years: 10.00    Pack years: 5.00    Types: Cigarettes    Quit date: 04/11/2008    Years since quitting: 11.5  . Smokeless tobacco: Never Used  Substance and Sexual Activity  . Alcohol use: No    Alcohol/week: 0.0 standard drinks  . Drug use: No  . Sexual activity: Yes  Partners: Female    Birth control/protection: None  Other Topics Concern  . Not on file  Social History Narrative   Lives with wife. Does not work.  On disability.    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 Provided crisis assistance program information to assist with bills       Has issues getting food- gets $36/month in food stamps- already has list of food pantries but has  not tried them- encouraged pt to try food pantries and reach out to clinic for help if needed   Social Determinants of Health   Financial Resource Strain:   . Difficulty of Paying Living Expenses:   Food Insecurity:   . Worried About Charity fundraiser in the Last Year:   . Arboriculturist in the Last Year:   Transportation Needs:   . Film/video editor (Medical):   [name] Lack of Transportation (Non-Medical):   Physical Activity:   . Days of Exercise per Week:   . Minutes of Exercise per Session:   Stress:   . Feeling of Stress :   Social Connections:   . Frequency of Communication with Friends and Family:   . Frequency of Social Gatherings with Friends and Family:   . Attends Religious Services:   . Active Member of Clubs or Organizations:   . Attends Archivist Meetings:   [name] Marital Status:   Intimate Partner Violence:   . Fear of Current or Ex-Partner:   . Emotionally Abused:   [name] Physically Abused:   . Sexually Abused:     Current Outpatient Medications on File Prior to Visit  Medication Sig Dispense Refill  . Accu-Chek FastClix Lancets MISC Use one strip to monitor glucose levels BID; E11.42 102 each 11  . albuterol (PROVENTIL HFA;VENTOLIN HFA) 108 (90 Base) MCG/ACT inhaler Inhale 2 puffs into the lungs every 6 (six) hours as needed for wheezing or shortness of breath. 1 Inhaler 0  . allopurinol (ZYLOPRIM) 100 MG tablet Take 100 mg by mouth daily.    [name] atorvastatin (LIPITOR) 40 MG tablet Take 1 tablet (40 mg total) by mouth daily. 90 tablet 2  . carvedilol (COREG) 12.5 MG tablet Take 1 tablet (12.5 mg total) by mouth 2 (two) times daily with a meal. 180 tablet 1  . DULoxetine (CYMBALTA) 20 MG capsule Take 1 capsule by mouth once daily 90 capsule 1  . EQ ASPIRIN ADULT LOW DOSE 81 MG EC tablet TAKE 1 TABLET BY MOUTH ONCE DAILY. SWALLOW WHOLE 90 tablet 0  . famotidine (PEPCID) 20 MG tablet Take 20 mg by mouth 2 (two) times daily.    . fluticasone (FLONASE) 50  MCG/ACT nasal spray Place 2 sprays into both nostrils daily. 16 g 6  . glucose blood (ACCU-CHEK GUIDE) test strip Use one strip to monitor glucose levels BID; E11.42 100 each 12  . hydrALAZINE (APRESOLINE) 50 MG tablet Take 1 tablet (50 mg total) by mouth every 8 (eight) hours. 90 tablet 4  . HYDROcodone-acetaminophen (NORCO) 10-325 MG tablet Take 1 tablet by mouth every 8 (eight) hours.    . Insulin Lispro Prot & Lispro (HUMALOG MIX 75/25 KWIKPEN) (75-25) 100 UNIT/ML Kwikpen Inject 150 Units into the skin daily with breakfast. 20 pen 11  . Insulin Pen Needle (  PEN NEEDLES) 31G X 6 MM MISC 1 each by Does not apply route daily. E11.9 90 each 0  . isosorbide mononitrate (IMDUR) 30 MG 24 hr tablet Take 1 tablet (30 mg total) by mouth daily. 90 tablet 3  . Potassium Chloride ER 20 MEQ TBCR Take 20 mEq by mouth daily. 90 tablet 3  . pregabalin (LYRICA) 100 MG capsule Take 100 mg by mouth 2 (two) times daily.    . sacubitril-valsartan (ENTRESTO) 97-103 MG Take 1 tablet by mouth 2 (two) times daily. Needs apt for further refills 60 tablet 1  . Secukinumab (COSENTYX) 150 MG/ML SOSY Inject 150 mg into the skin every 28 (twenty-eight) days.     . Tafamidis (VYNDAMAX) 61 MG CAPS Take 61 mg by mouth daily. 30 capsule 11  . zolpidem (AMBIEN CR) 6.25 MG CR tablet TAKE 1 TABLET BY MOUTH EVERY DAY AT BEDTIME AS NEEDED FOR SLEEP 30 tablet 1  . furosemide (LASIX) 40 MG tablet Take 1 tablet (40 mg total) by mouth 2 (two) times daily. 180 tablet 3   No current facility-administered medications on file prior to visit.    Allergies  Allergen Reactions  . Nsaids Other (See Comments)    Stomach pains. Ulcers - stated by patient     Family History  Problem Relation Age of Onset  . Hypertension Mother   . Diabetes Mother   . COPD Mother   . Lung cancer Mother        Smoker   . Hypertension Father   . Diabetes Father   . Heart Problems Father   . COPD Father   . Colon cancer Neg Hx   . Stomach cancer Neg Hx      BP 120/70   Pulse 82   Ht 5\' 6"  (1.676 m)   Wt 244 lb (110.7 kg)   SpO2 94%   BMI 39.38 kg/m    Review of Systems He denies hypoglycemia    Objective:   Physical Exam VITAL SIGNS:  See vs page GENERAL: no distress Pulses: dorsalis pedis intact bilat.   MSK: no deformity of the feet CV: no leg edema Skin:  no ulcer on the feet.  normal color and temp on the feet. Neuro: sensation is intact to touch on the feet    Lab Results  Component Value Date   HGBA1C 9.2 (H) 09/26/2019       Assessment & Plan:  Insulin-requiring type 2 DM, with DR: uncertain glycemic control Renal failure: Fructosamine is a more reliable indicator of glycemic control.  Patient Instructions  check your blood sugar twice a day.  vary the time of day when you check, between before the 3 meals, and at bedtime.  also check if you have symptoms of your blood sugar being too high or too low.  please keep a record of the readings and bring it to your next appointment here (or you can bring the meter itself).  You can write it on any piece of paper.  please call us sooner if your blood sugar goes below 70, or if you have a lot of readings over 200.   A different type of diabetes blood test is requested for you today.  We'll let you know about the results.    On this type of insulin schedule, you should eat meals on a regular schedule (especially lunch).  If a meal is missed or significantly delayed, your blood sugar could go low.    Please come back for  a follow-up appointment in 2 months.

## 2019-10-20 NOTE — Patient Instructions (Addendum)
check your blood sugar twice a day.  vary the time of day when you check, between before the 3 meals, and at bedtime.  also check if you have symptoms of your blood sugar being too high or too low.  please keep a record of the readings and bring it to your next appointment here (or you can bring the meter itself).  You can write it on any piece of paper.  please call us sooner if your blood sugar goes below 70, or if you have a lot of readings over 200.   A different type of diabetes blood test is requested for you today.  We'll let you know about the results.    On this type of insulin schedule, you should eat meals on a regular schedule (especially lunch).  If a meal is missed or significantly delayed, your blood sugar could go low.    Please come back for a follow-up appointment in 2 months.

## 2019-10-21 ENCOUNTER — Other Ambulatory Visit: Payer: Self-pay

## 2019-10-21 DIAGNOSIS — N1831 Chronic kidney disease, stage 3a: Secondary | ICD-10-CM

## 2019-10-22 ENCOUNTER — Other Ambulatory Visit (HOSPITAL_COMMUNITY): Payer: Self-pay | Admitting: Internal Medicine

## 2019-10-22 ENCOUNTER — Other Ambulatory Visit: Payer: Self-pay | Admitting: Internal Medicine

## 2019-10-22 MED FILL — FAMOTIDINE 20 MG TABLET: 20 | 30 days supply | Qty: 60 | Fill #1

## 2019-10-22 MED FILL — ISOSORBIDE MN ER 30 MG TAB: 30 | 90 days supply | Qty: 90 | Fill #0

## 2019-10-22 MED FILL — predniSONE 5 MG TABS: 5 | 30 days supply | Qty: 30 | Fill #0

## 2019-10-23 ENCOUNTER — Other Ambulatory Visit: Payer: Self-pay

## 2019-10-23 ENCOUNTER — Ambulatory Visit (HOSPITAL_COMMUNITY)
Admission: EM | Admit: 2019-10-23 | Discharge: 2019-10-23 | Disposition: A | Discharge status: Home / Self Care | Payer: Medicaid Other | Attending: Physician Assistant | Admitting: Physician Assistant

## 2019-10-23 ENCOUNTER — Encounter (HOSPITAL_COMMUNITY): Payer: Self-pay

## 2019-10-23 DIAGNOSIS — J0111 Acute recurrent frontal sinusitis: Secondary | ICD-10-CM | POA: Diagnosis not present

## 2019-10-23 DIAGNOSIS — G8929 Other chronic pain: Secondary | ICD-10-CM

## 2019-10-23 DIAGNOSIS — M25562 Pain in left knee: Secondary | ICD-10-CM

## 2019-10-23 MED ORDER — DOXYCYCLINE HYCLATE 100 MG PO CAPS: 100.0000 mg | ORAL_CAPSULE | Freq: Two times a day (BID) | ORAL | 0 refills | Status: DC

## 2019-10-23 MED ORDER — DICLOFENAC SODIUM 1 % EX GEL: 4.0000 g | Freq: Four times a day (QID) | CUTANEOUS | 0 refills | Status: DC

## 2019-10-23 NOTE — ED Provider Notes (Signed)
[facility] [facility]    CSN: [id number] Arrival date & time: 10/23/19  1521      History   Chief Complaint Chief Complaint  Patient presents with  . Facial Pain  . Knee Pain    HPI Marco [name] is a 64 y.o. male.   Patient reports urgent care for evaluation of facial pain and headache as well as left knee pain.  Reports 7 to 10 days of worsening nasal congestion with associated facial pain and left-sided headache.  He reports some nasal discharge that is yellow in color.  He denies cough ,shortness of breath, chest pain, fever, chills, nausea, vomiting, change or loss of taste or smell.  He reports a similar incident 1 year ago which progressed into sinus infection such as this.  He reports a history of occasional allergies and does not take medication for this.  Patient reports he uses CPAP machine to sleep at night.  He reports he washes and cleans this regularly.   He also reports left knee pain.  He reports this is in the front of his knee and is worse when immediately standing from a seated position.  Reports it is sharp.  Denies previous injury to this knee.  He reports he is on chronic pain medication and this helps his pain at times.     Past Medical History:  Diagnosis Date  . Benign colon polyp 08/01/2013   [facility] in [place]. large base tranverse colon polyp was biopsied. polyp was benign with minimal surface hyperplastic change.  . Chronic combined systolic and diastolic CHF (congestive heart failure) ([place])   . Chronic pain   . CKD (chronic kidney disease), stage III   . Diabetes mellitus without complication ([place])   . Diverticulosis   . Esophageal hiatal hernia 07/29/2013   confirmed on EGD   . Esophageal stricture   . Essential hypertension   . Gastritis 07/29/2013   confirmed on EGD, bx done an negative for intestinal metaplasia, dsyplasia or H. pylori. normal gastric emptying study done 07/13/2013.  [name] GERD (gastroesophageal reflux disease)    . Morbid obesity ([place])   . Non-obstructive CAD    a. 02/2013 Cath ([place]): nonobs dzs;  b. 09/2014 Myoview ([place]): EF 55%, no ischemia;  c. Cath 05/2018 mild CAD no obstruction.  . Persistent atrial fibrillation ([place])    a. 02/2013 s/p rfca in [place], [place]-->prev on Xarelto, d/c'd 2/2 anemia, ? GIB.  [name] Rheumatoid arthritis ([place])   . Sigmoid diverticulosis 08/01/2013   confirmed on colonscopy. record scanned into chart    Patient Active Problem List   Diagnosis Date Noted  . Rhinitis 10/16/2019  . Gastroesophageal reflux disease without esophagitis 08/31/2018  . Muscle spasm 08/31/2018  . NICM (nonischemic cardiomyopathy) ([place]) 07/06/2018  . Hypokalemia   . Hyperlipidemia 12/23/2016  . Chronic combined systolic and diastolic CHF (congestive heart failure) ([place]) 09/09/2016  . Diabetic retinopathy ([place]) 07/22/2016  . Diabetic polyneuropathy associated with type 2 diabetes mellitus ([place]) 06/10/2016  . Vision changes 06/02/2016  . Myalgia and myositis 03/31/2016  . Shortness of breath 02/09/2016  . Chronic gouty arthritis 11/29/2015  . LBP (low back pain) 11/27/2015  . Arthralgia of multiple joints 11/27/2015  . 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[place]) 11/27/2015  . Breast pain in male 11/14/2015  . Sinusitis, chronic 10/16/2015  . Insomnia 10/16/2015  . New onset of headaches after age 47 09/20/2015  . OSA (obstructive sleep apnea) 07/12/2015  . Low serum testosterone level 05/18/2015  .  Depression 05/16/2015  . Morbid obesity due to excess calories ([place])   . Chronic pain   . Diabetes ([place])   . CAD (coronary artery disease) 12/24/2014  . Atrial fibrillation ([place]) 12/24/2014  . Essential hypertension 12/24/2014  . Chronic kidney disease 12/24/2014  . PUD (peptic ulcer disease) 12/24/2014    Past Surgical History:  Procedure Laterality Date  . CARDIAC CATHETERIZATION  05/2014   ablation for atrial fibrillation  . CARDIAC CATHETERIZATION N/A 11/16/2015   Procedure: Left Heart Cath and  Coronary Angiography;  Surgeon: Leonie Man, MD;  Location: [facility];  Service: Cardiovascular;  Laterality: N/A;  . COLON RESECTION  09/2013   due to large, abnormal polpy. non cancerous per patient.   . COLON SURGERY  09/2014   colon resection   . LEFT HEART CATH AND CORONARY ANGIOGRAPHY N/A 05/19/2018   Procedure: LEFT HEART CATH AND CORONARY ANGIOGRAPHY;  Surgeon: Wellington Hampshire, MD;  Location: [facility];  Service: Cardiovascular;  Laterality: N/A;       Home Medications    Prior to Admission medications   Medication Sig Start Date End Date Taking? Authorizing Provider  Accu-Chek FastClix Lancets MISC Use one strip to monitor glucose levels BID; E11.42 12/06/18   Renato Shin, MD  albuterol (PROVENTIL HFA;VENTOLIN HFA) 108 (90 Base) MCG/ACT inhaler Inhale 2 puffs into the lungs every 6 (six) hours as needed for wheezing or shortness of breath. 04/29/18   [name], MD  allopurinol (ZYLOPRIM) 100 MG tablet Take 100 mg by mouth daily.    [provider]  atorvastatin (LIPITOR) 40 MG tablet Take 1 tablet (40 mg total) by mouth daily. 09/27/19   Sueanne Margarita, MD  carvedilol (COREG) 12.5 MG tablet Take 1 tablet (12.5 mg total) by mouth 2 (two) times daily with a meal. 09/12/19   Larey Dresser, MD  DULoxetine (CYMBALTA) 20 MG capsule Take 1 capsule by mouth once daily 06/15/19   [name], MD  EQ ASPIRIN ADULT LOW DOSE 81 MG EC tablet TAKE 1 TABLET BY MOUTH ONCE DAILY. SWALLOW WHOLE 08/01/19   [name], MD  famotidine (PEPCID) 20 MG tablet Take 20 mg by mouth 2 (two) times daily.    [provider]  fluticasone (FLONASE) 50 MCG/ACT nasal spray Place 2 sprays into both nostrils daily. 09/16/19   [name], MD  furosemide (LASIX) 40 MG tablet Take 1 tablet (40 mg total) by mouth 2 (two) times daily. 07/13/18 09/26/19  [name] T, [name]  glucose blood (ACCU-CHEK GUIDE) test strip Use one strip to monitor glucose  levels BID; E11.42 12/06/18   Renato Shin, MD  hydrALAZINE (APRESOLINE) 50 MG tablet Take 1 tablet (50 mg total) by mouth every 8 (eight) hours. 05/24/19   [name], MD  HYDROcodone-acetaminophen (NORCO) 10-325 MG tablet Take 1 tablet by mouth every 8 (eight) hours. 05/05/19   [provider]  Insulin Lispro Prot & Lispro (HUMALOG MIX 75/25 KWIKPEN) (75-25) 100 UNIT/ML Kwikpen Inject 150 Units into the skin daily with breakfast. 07/29/19   Renato Shin, MD  Insulin Pen Needle (PEN NEEDLES) 31G X 6 MM MISC 1 each by Does not apply route daily. E11.9 10/19/19   Renato Shin, MD  isosorbide mononitrate (IMDUR) 30 MG 24 hr tablet Take 1 tablet (30 mg total) by mouth daily. 09/12/19   Sueanne Margarita, MD  Potassium Chloride ER 20 MEQ TBCR Take 20 mEq by mouth daily. 09/08/19  09/07/20  Bensimhon, Shaune Pascal, MD  pregabalin (LYRICA) 100 MG capsule Take 100 mg by mouth 2 (two) times daily.    [provider]  sacubitril-valsartan (ENTRESTO) 97-103 MG Take 1 tablet by mouth 2 (two) times daily. Needs apt for further refills 08/19/19   Bensimhon, Shaune Pascal, MD  Secukinumab (COSENTYX) 150 MG/ML SOSY Inject 150 mg into the skin every 28 (twenty-eight) days.  06/02/16   [provider]  Tafamidis (VYNDAMAX) 61 MG CAPS Take 61 mg by mouth daily. 09/08/19   Bensimhon, Shaune Pascal, MD  zolpidem (AMBIEN CR) 6.25 MG CR tablet TAKE 1 TABLET BY MOUTH EVERY DAY AT BEDTIME AS NEEDED FOR SLEEP 09/20/19   [name], MD    Family History Family History  Problem Relation Age of Onset  . Hypertension Mother   . Diabetes Mother   . COPD Mother   . Lung cancer Mother        Smoker   . Hypertension Father   . Diabetes Father   . Heart Problems Father   . COPD Father   . Colon cancer Neg Hx   . Stomach cancer Neg Hx     Social History Social History   Tobacco Use  . Smoking status: Former Smoker    Packs/day: 0.50    Years: 10.00    Pack years: 5.00    Types: Cigarettes     Quit date: 04/11/2008    Years since quitting: 11.5  . Smokeless tobacco: Never Used  Substance Use Topics  . Alcohol use: No    Alcohol/week: 0.0 standard drinks  . Drug use: No     Allergies   Nsaids   Review of Systems Review of Systems  Constitutional: Negative for chills and fever.  HENT: Positive for congestion, sinus pressure and sinus pain. Negative for ear pain, hearing loss, postnasal drip, rhinorrhea and sore throat.   Musculoskeletal: Positive for arthralgias. Negative for back pain and myalgias.  Skin: Negative for color change and rash.  Neurological: Positive for headaches.  All other systems reviewed and are negative.    Physical Exam Triage Vital Signs ED Triage Vitals  Enc Vitals Group     BP 10/23/19 1656 (!) 159/76     Pulse Rate 10/23/19 1656 72     Resp 10/23/19 1656 18     Temp 10/23/19 1656 98.6 F (37 C)     Temp Source 10/23/19 1656 Oral     SpO2 10/23/19 1656 97 %     Weight --      Height --      Head Circumference --      Peak Flow --      Pain Score 10/23/19 1655 8     Pain Loc --      Pain Edu? --      Excl. in [place]? --    No data found.  Updated Vital Signs BP (!) 159/76 (BP Location: Left Arm)   Pulse 72   Temp 98.6 F (37 C) (Oral)   Resp 18   SpO2 97%   Visual Acuity Right Eye Distance:   Left Eye Distance:   Bilateral Distance:    Right Eye Near:   Left Eye Near:    Bilateral Near:     Physical Exam Vitals and nursing note reviewed.  Constitutional:      General: He is not in acute distress.    Appearance: Normal appearance. He is well-developed. He is not ill-appearing.  HENT:  Head: Normocephalic and atraumatic.     Comments: Frontal, maxillary and ethmoid tenderness to palpation primarily on the left side.    Nose:     Comments: Swollen erythematous turbinates on left greater than right.  There is purulent discharge in the inferior turbinate on the left side.  Possible nasal polyp on the middle turbinate of  the left side however inconclusive at this time.    Mouth/Throat:     Mouth: Mucous membranes are moist.     Pharynx: Oropharynx is clear.  Eyes:     Extraocular Movements: Extraocular movements intact.     Conjunctiva/sclera: Conjunctivae normal.     Pupils: Pupils are equal, round, and reactive to light.  Cardiovascular:     Rate and Rhythm: Normal rate and regular rhythm.     Heart sounds: No murmur.  Pulmonary:     Effort: Pulmonary effort is normal. No respiratory distress.     Breath sounds: Normal breath sounds.  Musculoskeletal:     Cervical back: Neck supple.     Right lower leg: No edema.     Left lower leg: No edema.  Skin:    General: Skin is warm and dry.  Neurological:     Mental Status: He is alert.      UC Treatments / Results  Labs (all labs ordered are listed, but only abnormal results are displayed) Labs Reviewed - No data to display  EKG   Radiology No results found.  Procedures Procedures (including critical care time)  Medications Ordered in UC Medications - No data to display  Initial Impression / Assessment and Plan / UC Course  I have reviewed the triage vital signs and the nursing notes.  Pertinent labs & imaging results that were available during my care of the patient were reviewed by me and considered in my medical decision making (see chart for details).     #Sinusitis #Knee pain Patient is a 64 year old male presenting with symptoms consistent with acute bacterial sinusitis.  Symptoms have been worsening with duration greater than 7 days at this point.  Given was a facial tenderness and risk factors such as diabetes and CPAP usage will start on doxycycline.  He did not tolerate previous amoxicillin or Augmentin treatments.  Patient will also previously given clindamycin however he does have a history of C. difficile so we will avoid this as well.  Believe knee pain is possible arthritic but also likely patellofemoral syndrome.   Discharged with Voltaren gel as he has chronic kidney disease and history of GI bleeds.  Instructed to mobilize the knee and avoid long periods of seated sitting. -Ducted patient to follow-up in 10 days with primary care for reassessment of knee and sinuses.  Patient agrees.  Final Clinical Impressions(s) / UC Diagnoses   Final diagnoses:  None   Discharge Instructions   None    ED Prescriptions    None     PDMP not reviewed this encounter.   Purnell Shoemaker, [name] 10/23/19 1736

## 2019-10-23 NOTE — ED Triage Notes (Signed)
Pt present left side facial and knee pain. The facial pain just started Tuesday of last week and the knee pain been going on for some time now.

## 2019-10-23 NOTE — Discharge Instructions (Signed)
Take the doxycycline twice a day for 10 days.  Drink 8 or more ounces of water when taking this.  Do not lay down immediately after taking this.  Use the Voltaren gel on your left knee up to 4 times a day  Please follow-up with your primary care for continued monitoring of your left knee pain as well as consideration for reevaluation of your sinus pain in 10 days.  Take Tylenol for your pain.

## 2019-10-24 ENCOUNTER — Other Ambulatory Visit: Payer: Self-pay

## 2019-10-24 MED FILL — hydrALAZINE HCL 50 MG TABS: 50 | 30 days supply | Qty: 90 | Fill #0

## 2019-10-24 MED FILL — ENTRESTO 97 MG-103 MG TAB: 97-103 | 30 days supply | Qty: 60 | Fill #0

## 2019-10-25 LAB — FRUCTOSAMINE: Fructosamine: 342 umol/L — ABNORMAL HIGH (ref 205–285)

## 2019-10-27 ENCOUNTER — Telehealth: Payer: Self-pay | Admitting: Hematology and Oncology

## 2019-10-27 NOTE — Telephone Encounter (Signed)
I cld and lft a vm for Marco [name] to reschedule his new pt appt w/Dr. Lorenso Courier.

## 2019-10-31 ENCOUNTER — Telehealth: Payer: Self-pay | Admitting: Internal Medicine

## 2019-10-31 NOTE — Telephone Encounter (Signed)
Patient called and requested to speak with pcp regarding UC follow up. Patient requested for an appointment by the 24th of March. Patient was informed pcp schedule was booked out through requested date, as of right now. Patient was offered and appointment with a different provider, patient declined and stated that he only wanted to see his pcp. Patient requested to send a note to pcp requesting to be "fit in". Please follow up at your earliest convenience.

## 2019-10-31 NOTE — Telephone Encounter (Signed)
Contacted pt and made pt aware that Dr. Wynetta Emery doesn't have any availability till April. Pt states that is unaccaptable and he went to hosptial for sinuitis and he is needing to be seen. I made pt aware that I can schedule him with another provider. Pt states it's not right that he has to see another provider when the other providers doesn't know him and his medical issues. I made pt aware that all providers have access to his chart and can view his medical history. Pt states he guess he has to take the appointment with the other provider. Made pt an appointment with the Walk-in for March 25 at 830.

## 2019-11-01 MED FILL — FUROSEMIDE 80 MG TAB: 80 | 30 days supply | Qty: 60 | Fill #0

## 2019-11-03 ENCOUNTER — Ambulatory Visit: Payer: Medicaid Other

## 2019-11-03 MED FILL — VYNDAMAX 61 MG CAPS: 61 | 30 days supply | Qty: 30 | Fill #2

## 2019-11-09 LAB — HM DIABETES EYE EXAM

## 2019-11-16 ENCOUNTER — Encounter: Payer: Self-pay | Admitting: Internal Medicine

## 2019-11-16 NOTE — Progress Notes (Signed)
Pt seen by Warden Fillers 11/09/2019. Exam revealed no DM retinopathy. Pt's problem list updated.

## 2019-11-17 ENCOUNTER — Encounter (HOSPITAL_COMMUNITY): Payer: Self-pay | Admitting: Emergency Medicine

## 2019-11-17 ENCOUNTER — Emergency Department (HOSPITAL_COMMUNITY)
Admission: EM | Admit: 2019-11-17 | Discharge: 2019-11-17 | Disposition: A | Discharge status: Home / Self Care | Payer: Medicaid Other | Attending: Emergency Medicine | Admitting: Emergency Medicine

## 2019-11-17 ENCOUNTER — Other Ambulatory Visit: Payer: Self-pay

## 2019-11-17 DIAGNOSIS — R519 Headache, unspecified: Secondary | ICD-10-CM | POA: Diagnosis not present

## 2019-11-17 DIAGNOSIS — M7918 Myalgia, other site: Secondary | ICD-10-CM | POA: Diagnosis not present

## 2019-11-17 DIAGNOSIS — Z5321 Procedure and treatment not carried out due to patient leaving prior to being seen by health care provider: Secondary | ICD-10-CM | POA: Diagnosis not present

## 2019-11-17 DIAGNOSIS — R197 Diarrhea, unspecified: Secondary | ICD-10-CM | POA: Insufficient documentation

## 2019-11-17 LAB — COMPREHENSIVE METABOLIC PANEL
ALT: 16 U/L (ref 0–44)
AST: 12 U/L — ABNORMAL LOW (ref 15–41)
Albumin: 3 g/dL — ABNORMAL LOW (ref 3.5–5.0)
Alkaline Phosphatase: 48 U/L (ref 38–126)
Anion gap: 11 (ref 5–15)
BUN: 15 mg/dL (ref 8–23)
CO2: 31 mmol/L (ref 22–32)
Calcium: 8.4 mg/dL — ABNORMAL LOW (ref 8.9–10.3)
Chloride: 100 mmol/L (ref 98–111)
Creatinine, Ser: 1.94 mg/dL — ABNORMAL HIGH (ref 0.61–1.24)
GFR calc Af Amer: 41 mL/min — ABNORMAL LOW (ref >60)
GFR calc non Af Amer: 36 mL/min — ABNORMAL LOW (ref >60)
Glucose, Bld: 165 mg/dL — ABNORMAL HIGH (ref 70–99)
Potassium: 3.4 mmol/L — ABNORMAL LOW (ref 3.5–5.1)
Sodium: 142 mmol/L (ref 135–145)
Total Bilirubin: 0.9 mg/dL (ref 0.3–1.2)
Total Protein: 7.1 g/dL (ref 6.5–8.1)

## 2019-11-17 LAB — URINALYSIS, ROUTINE W REFLEX MICROSCOPIC
Bilirubin Urine: NEGATIVE
Glucose, UA: NEGATIVE mg/dL
Hgb urine dipstick: NEGATIVE
Ketones, ur: NEGATIVE mg/dL
Nitrite: NEGATIVE
Protein, ur: 100 mg/dL — AB
Specific Gravity, Urine: 1.016 (ref 1.005–1.030)
pH: 5 (ref 5.0–8.0)

## 2019-11-17 LAB — CBC WITH DIFFERENTIAL/PLATELET
Abs Immature Granulocytes: 0.11 10*3/uL — ABNORMAL HIGH (ref 0.00–0.07)
Basophils Absolute: 0.1 10*3/uL (ref 0.0–0.1)
Basophils Relative: 0 %
Eosinophils Absolute: 0.1 10*3/uL (ref 0.0–0.5)
Eosinophils Relative: 1 %
HCT: 44.2 % (ref 39.0–52.0)
Hemoglobin: 13.5 g/dL (ref 13.0–17.0)
Immature Granulocytes: 1 %
Lymphocytes Relative: 10 %
Lymphs Abs: 1.8 10*3/uL (ref 0.7–4.0)
MCH: 30.1 pg (ref 26.0–34.0)
MCHC: 30.5 g/dL (ref 30.0–36.0)
MCV: 98.4 fL (ref 80.0–100.0)
Monocytes Absolute: 1.6 10*3/uL — ABNORMAL HIGH (ref 0.1–1.0)
Monocytes Relative: 9 %
Neutro Abs: 13.8 10*3/uL — ABNORMAL HIGH (ref 1.7–7.7)
Neutrophils Relative %: 79 %
Platelets: 210 10*3/uL (ref 150–400)
RBC: 4.49 MIL/uL (ref 4.22–5.81)
RDW: 15 % (ref 11.5–15.5)
WBC: 17.4 10*3/uL — ABNORMAL HIGH (ref 4.0–10.5)
nRBC: 0 % (ref 0.0–0.2)

## 2019-11-17 NOTE — ED Notes (Signed)
Called pt to reassess vitals x3 and had no answer.

## 2019-11-17 NOTE — ED Triage Notes (Signed)
Pt arrives via [facility],  reports body aches, headache, congestion, diarrhea, and loss of taste since yesterday. Had 1st covid vaccine on 3/15. VSS.

## 2019-11-22 ENCOUNTER — Ambulatory Visit: Payer: Medicaid Other | Admitting: Sports Medicine

## 2019-11-23 MED FILL — ENTRESTO 97 MG-103 MG TAB: 97-103 | 30 days supply | Qty: 60 | Fill #1

## 2019-11-23 MED FILL — hydrALAZINE HCL 50 MG TABS: 50 | 30 days supply | Qty: 90 | Fill #1

## 2019-11-23 MED FILL — ALLOPURINOL 100 MG TABS: 100 | 30 days supply | Qty: 60 | Fill #1

## 2019-11-23 MED FILL — predniSONE 5 MG TABS: 5 | 30 days supply | Qty: 30 | Fill #1

## 2019-11-23 MED FILL — PREGABALIN 100 MG CAPS: 100 | 30 days supply | Qty: 90 | Fill #1

## 2019-11-23 MED FILL — CARVEDILOL 12.5 MG TABLET: 12.5 | 90 days supply | Qty: 180 | Fill #0

## 2019-11-23 MED FILL — FAMOTIDINE 20 MG TABLET: 20 | 30 days supply | Qty: 60 | Fill #2

## 2019-12-02 ENCOUNTER — Other Ambulatory Visit: Payer: Self-pay | Admitting: Internal Medicine

## 2019-12-02 DIAGNOSIS — G4700 Insomnia, unspecified: Secondary | ICD-10-CM

## 2019-12-05 MED FILL — POTASSIUM CHLORIDE CRYS ER: 20 | 90 days supply | Qty: 90 | Fill #1

## 2019-12-05 MED FILL — VYNDAMAX 61 MG CAPS: 61 | 30 days supply | Qty: 30 | Fill #3

## 2019-12-08 ENCOUNTER — Inpatient Hospital Stay (HOSPITAL_COMMUNITY)
Admission: EM | Admit: 2019-12-08 | Discharge: 2019-12-13 | DRG: 871 | Disposition: A | Discharge status: Home / Self Care | Payer: Medicaid Other | Attending: Family Medicine | Admitting: Family Medicine

## 2019-12-08 ENCOUNTER — Encounter (HOSPITAL_COMMUNITY): Payer: Self-pay

## 2019-12-08 ENCOUNTER — Emergency Department (HOSPITAL_COMMUNITY): Payer: Medicaid Other

## 2019-12-08 DIAGNOSIS — Z79891 Long term (current) use of opiate analgesic: Secondary | ICD-10-CM

## 2019-12-08 DIAGNOSIS — E1142 Type 2 diabetes mellitus with diabetic polyneuropathy: Secondary | ICD-10-CM | POA: Diagnosis present

## 2019-12-08 DIAGNOSIS — Z87891 Personal history of nicotine dependence: Secondary | ICD-10-CM

## 2019-12-08 DIAGNOSIS — Z7952 Long term (current) use of systemic steroids: Secondary | ICD-10-CM

## 2019-12-08 DIAGNOSIS — Z794 Long term (current) use of insulin: Secondary | ICD-10-CM

## 2019-12-08 DIAGNOSIS — R109 Unspecified abdominal pain: Secondary | ICD-10-CM

## 2019-12-08 DIAGNOSIS — A4150 Gram-negative sepsis, unspecified: Principal | ICD-10-CM | POA: Diagnosis present

## 2019-12-08 DIAGNOSIS — L4050 Arthropathic psoriasis, unspecified: Secondary | ICD-10-CM | POA: Diagnosis present

## 2019-12-08 DIAGNOSIS — I2510 Atherosclerotic heart disease of native coronary artery without angina pectoris: Secondary | ICD-10-CM | POA: Diagnosis present

## 2019-12-08 DIAGNOSIS — Z886 Allergy status to analgesic agent status: Secondary | ICD-10-CM

## 2019-12-08 DIAGNOSIS — N179 Acute kidney failure, unspecified: Secondary | ICD-10-CM | POA: Diagnosis present

## 2019-12-08 DIAGNOSIS — N136 Pyonephrosis: Secondary | ICD-10-CM | POA: Diagnosis present

## 2019-12-08 DIAGNOSIS — I4819 Other persistent atrial fibrillation: Secondary | ICD-10-CM | POA: Diagnosis present

## 2019-12-08 DIAGNOSIS — Z833 Family history of diabetes mellitus: Secondary | ICD-10-CM

## 2019-12-08 DIAGNOSIS — E11649 Type 2 diabetes mellitus with hypoglycemia without coma: Secondary | ICD-10-CM | POA: Diagnosis present

## 2019-12-08 DIAGNOSIS — Z7982 Long term (current) use of aspirin: Secondary | ICD-10-CM

## 2019-12-08 DIAGNOSIS — E860 Dehydration: Secondary | ICD-10-CM | POA: Diagnosis present

## 2019-12-08 DIAGNOSIS — Z20822 Contact with and (suspected) exposure to covid-19: Secondary | ICD-10-CM | POA: Diagnosis present

## 2019-12-08 DIAGNOSIS — E669 Obesity, unspecified: Secondary | ICD-10-CM | POA: Diagnosis present

## 2019-12-08 DIAGNOSIS — E1140 Type 2 diabetes mellitus with diabetic neuropathy, unspecified: Secondary | ICD-10-CM | POA: Diagnosis present

## 2019-12-08 DIAGNOSIS — R6520 Severe sepsis without septic shock: Secondary | ICD-10-CM | POA: Diagnosis present

## 2019-12-08 DIAGNOSIS — F329 Major depressive disorder, single episode, unspecified: Secondary | ICD-10-CM | POA: Diagnosis present

## 2019-12-08 DIAGNOSIS — G9341 Metabolic encephalopathy: Secondary | ICD-10-CM | POA: Diagnosis present

## 2019-12-08 DIAGNOSIS — E1122 Type 2 diabetes mellitus with diabetic chronic kidney disease: Secondary | ICD-10-CM | POA: Diagnosis present

## 2019-12-08 DIAGNOSIS — K219 Gastro-esophageal reflux disease without esophagitis: Secondary | ICD-10-CM | POA: Diagnosis present

## 2019-12-08 DIAGNOSIS — Z79899 Other long term (current) drug therapy: Secondary | ICD-10-CM

## 2019-12-08 DIAGNOSIS — G4733 Obstructive sleep apnea (adult) (pediatric): Secondary | ICD-10-CM | POA: Diagnosis present

## 2019-12-08 DIAGNOSIS — G8929 Other chronic pain: Secondary | ICD-10-CM | POA: Diagnosis present

## 2019-12-08 DIAGNOSIS — N1832 Chronic kidney disease, stage 3b: Secondary | ICD-10-CM | POA: Diagnosis present

## 2019-12-08 DIAGNOSIS — Z801 Family history of malignant neoplasm of trachea, bronchus and lung: Secondary | ICD-10-CM

## 2019-12-08 DIAGNOSIS — E875 Hyperkalemia: Secondary | ICD-10-CM | POA: Diagnosis present

## 2019-12-08 DIAGNOSIS — R0789 Other chest pain: Secondary | ICD-10-CM | POA: Diagnosis present

## 2019-12-08 DIAGNOSIS — Z6838 Body mass index (BMI) 38.0-38.9, adult: Secondary | ICD-10-CM

## 2019-12-08 DIAGNOSIS — R10819 Abdominal tenderness, unspecified site: Secondary | ICD-10-CM | POA: Diagnosis present

## 2019-12-08 DIAGNOSIS — N183 Chronic kidney disease, stage 3 unspecified: Secondary | ICD-10-CM | POA: Diagnosis present

## 2019-12-08 DIAGNOSIS — I130 Hypertensive heart and chronic kidney disease with heart failure and stage 1 through stage 4 chronic kidney disease, or unspecified chronic kidney disease: Secondary | ICD-10-CM | POA: Diagnosis present

## 2019-12-08 DIAGNOSIS — E118 Type 2 diabetes mellitus with unspecified complications: Secondary | ICD-10-CM | POA: Diagnosis present

## 2019-12-08 DIAGNOSIS — I428 Other cardiomyopathies: Secondary | ICD-10-CM | POA: Diagnosis present

## 2019-12-08 DIAGNOSIS — I5042 Chronic combined systolic (congestive) and diastolic (congestive) heart failure: Secondary | ICD-10-CM | POA: Diagnosis present

## 2019-12-08 DIAGNOSIS — A419 Sepsis, unspecified organism: Secondary | ICD-10-CM

## 2019-12-08 DIAGNOSIS — B9620 Unspecified Escherichia coli [E. coli] as the cause of diseases classified elsewhere: Secondary | ICD-10-CM | POA: Diagnosis present

## 2019-12-08 DIAGNOSIS — M069 Rheumatoid arthritis, unspecified: Secondary | ICD-10-CM | POA: Diagnosis present

## 2019-12-08 DIAGNOSIS — Z8249 Family history of ischemic heart disease and other diseases of the circulatory system: Secondary | ICD-10-CM

## 2019-12-08 DIAGNOSIS — Z86010 Personal history of colonic polyps: Secondary | ICD-10-CM

## 2019-12-08 DIAGNOSIS — Z825 Family history of asthma and other chronic lower respiratory diseases: Secondary | ICD-10-CM

## 2019-12-08 LAB — CBC WITH DIFFERENTIAL/PLATELET
Abs Immature Granulocytes: 0.12 10*3/uL — ABNORMAL HIGH (ref 0.00–0.07)
Basophils Absolute: 0 10*3/uL (ref 0.0–0.1)
Basophils Relative: 0 %
Eosinophils Absolute: 0 10*3/uL (ref 0.0–0.5)
Eosinophils Relative: 0 %
HCT: 34.6 % — ABNORMAL LOW (ref 39.0–52.0)
Hemoglobin: 10.8 g/dL — ABNORMAL LOW (ref 13.0–17.0)
Immature Granulocytes: 1 %
Lymphocytes Relative: 7 %
Lymphs Abs: 1.2 10*3/uL (ref 0.7–4.0)
MCH: 29.6 pg (ref 26.0–34.0)
MCHC: 31.2 g/dL (ref 30.0–36.0)
MCV: 94.8 fL (ref 80.0–100.0)
Monocytes Absolute: 1.9 10*3/uL — ABNORMAL HIGH (ref 0.1–1.0)
Monocytes Relative: 11 %
Neutro Abs: 14.6 10*3/uL — ABNORMAL HIGH (ref 1.7–7.7)
Neutrophils Relative %: 81 %
Platelets: 270 10*3/uL (ref 150–400)
RBC: 3.65 MIL/uL — ABNORMAL LOW (ref 4.22–5.81)
RDW: 15.6 % — ABNORMAL HIGH (ref 11.5–15.5)
WBC: 17.9 10*3/uL — ABNORMAL HIGH (ref 4.0–10.5)
nRBC: 0 % (ref 0.0–0.2)

## 2019-12-08 LAB — BLOOD GAS, ARTERIAL
Acid-base deficit: 3 mmol/L — ABNORMAL HIGH (ref 0.0–2.0)
Bicarbonate: 22.2 mmol/L (ref 20.0–28.0)
FIO2: 21
O2 Saturation: 90.9 %
Patient temperature: 97.6
pCO2 arterial: 42 mmHg (ref 32.0–48.0)
pH, Arterial: 7.34 — ABNORMAL LOW (ref 7.350–7.450)
pO2, Arterial: 66.1 mmHg — ABNORMAL LOW (ref 83.0–108.0)

## 2019-12-08 LAB — COMPREHENSIVE METABOLIC PANEL
ALT: 20 U/L (ref 0–44)
AST: 24 U/L (ref 15–41)
Albumin: 2.7 g/dL — ABNORMAL LOW (ref 3.5–5.0)
Alkaline Phosphatase: 45 U/L (ref 38–126)
Anion gap: 12 (ref 5–15)
BUN: 73 mg/dL — ABNORMAL HIGH (ref 8–23)
CO2: 22 mmol/L (ref 22–32)
Calcium: 8.2 mg/dL — ABNORMAL LOW (ref 8.9–10.3)
Chloride: 108 mmol/L (ref 98–111)
Creatinine, Ser: 5.61 mg/dL — ABNORMAL HIGH (ref 0.61–1.24)
GFR calc Af Amer: 11 mL/min — ABNORMAL LOW (ref >60)
GFR calc non Af Amer: 10 mL/min — ABNORMAL LOW (ref >60)
Glucose, Bld: 111 mg/dL — ABNORMAL HIGH (ref 70–99)
Potassium: 5.8 mmol/L — ABNORMAL HIGH (ref 3.5–5.1)
Sodium: 142 mmol/L (ref 135–145)
Total Bilirubin: 0.8 mg/dL (ref 0.3–1.2)
Total Protein: 7.5 g/dL (ref 6.5–8.1)

## 2019-12-08 LAB — RESPIRATORY PANEL BY RT PCR (FLU A&B, COVID)
Influenza A by PCR: NEGATIVE
Influenza B by PCR: NEGATIVE
SARS Coronavirus 2 by RT PCR: NEGATIVE

## 2019-12-08 LAB — PROTIME-INR
INR: 1.3 — ABNORMAL HIGH (ref 0.8–1.2)
Prothrombin Time: 15.5 seconds — ABNORMAL HIGH (ref 11.4–15.2)

## 2019-12-08 LAB — LACTIC ACID, PLASMA: Lactic Acid, Venous: 1.4 mmol/L (ref 0.5–1.9)

## 2019-12-08 LAB — APTT: aPTT: 33 seconds (ref 24–36)

## 2019-12-08 MED ORDER — SODIUM CHLORIDE 0.9 % IV BOLUS (SEPSIS)
1000.0000 mL | Freq: Once | INTRAVENOUS | Status: AC
  Administered 2019-12-08: 1000 mL via INTRAVENOUS

## 2019-12-08 MED ORDER — PIPERACILLIN-TAZOBACTAM 3.375 G IVPB
3.3750 g | Freq: Once | INTRAVENOUS | Status: AC
  Administered 2019-12-08: 3.375 g via INTRAVENOUS
  Filled 2019-12-08: qty 50

## 2019-12-08 MED ORDER — VANCOMYCIN HCL IN DEXTROSE 1-5 GM/200ML-% IV SOLN
1000.0000 mg | Freq: Once | INTRAVENOUS | Status: AC
  Administered 2019-12-08: 1000 mg via INTRAVENOUS
  Filled 2019-12-08: qty 200

## 2019-12-08 NOTE — ED Triage Notes (Signed)
Per ems, spouse notes patient has been minimally responsive since Sunday. Has still been giving insulin and opiates though patient has been unable to eat. Incontinent of bowel and bladder. On arrival CBG - 60, patient only responsive to pain. Patient responsive on arrival to ED to moderate stimuli. Lethargic. Got 600 mL NS and 15 G d10. bp now 85/60.

## 2019-12-08 NOTE — ED Provider Notes (Signed)
[facility] DEPT Provider Note   CSN: [id number]:[id number] Arrival date & time: 12/08/19  1959     History Chief Complaint  Patient presents with  . Code Sepsis    Marco [name] is a 64 y.o. male.  Patient is a 64 year old male with past medical history of chronic renal insufficiency, CHF, obesity, hypertension, diabetes.  He is brought by EMS for evaluation of altered mental status.  According to the wife, the patient has been having increased somnolence over the past several days.  She has tried to encourage him to come to the ER to be evaluated, however he has refused.  This evening he became worse and has convinced him to come in.  EMS found him to be hypotensive.  IV fluids were initiated and the patient transported here.  According to the wife, he has had decreased p.o. intake and I am told burning with urination.  The history is provided by the patient.       Past Medical History:  Diagnosis Date  . Benign colon polyp 08/01/2013   [facility] in [place]. large base tranverse colon polyp was biopsied. polyp was benign with minimal surface hyperplastic change.  . Chronic combined systolic and diastolic CHF (congestive heart failure) ([place])   . Chronic pain   . CKD (chronic kidney disease), stage III   . Diabetes mellitus without complication ([place])   . Diverticulosis   . Esophageal hiatal hernia 07/29/2013   confirmed on EGD   . Esophageal stricture   . Essential hypertension   . Gastritis 07/29/2013   confirmed on EGD, bx done an negative for intestinal metaplasia, dsyplasia or H. pylori. normal gastric emptying study done 07/13/2013.  [name] GERD (gastroesophageal reflux disease)   . Morbid obesity ([place])   . Non-obstructive CAD    a. 02/2013 Cath ([place]): nonobs dzs;  b. 09/2014 Myoview ([place]): EF 55%, no ischemia;  c. Cath 05/2018 mild CAD no obstruction.  . Persistent atrial fibrillation ([place])    a. 02/2013 s/p rfca in [place], [place]-->prev on  Xarelto, d/c'd 2/2 anemia, ? GIB.  [name] Rheumatoid arthritis ([place])   . Sigmoid diverticulosis 08/01/2013   confirmed on colonscopy. record scanned into chart    Patient Active Problem List   Diagnosis Date Noted  . Rhinitis 10/16/2019  . Gastroesophageal reflux disease without esophagitis 08/31/2018  . Muscle spasm 08/31/2018  . NICM (nonischemic cardiomyopathy) ([place]) 07/06/2018  . Hypokalemia   . Hyperlipidemia 12/23/2016  . Chronic combined systolic and diastolic CHF (congestive heart failure) ([place]) 09/09/2016  . Diabetic polyneuropathy associated with type 2 diabetes mellitus ([place]) 06/10/2016  . Vision changes 06/02/2016  . Myalgia and myositis 03/31/2016  . Shortness of breath 02/09/2016  . Chronic gouty arthritis 11/29/2015  . LBP (low back pain) 11/27/2015  . Arthralgia of multiple joints 11/27/2015  . 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[place]) 11/27/2015  . Breast pain in male 11/14/2015  . Sinusitis, chronic 10/16/2015  . Insomnia 10/16/2015  . New onset of headaches after age 73 09/20/2015  . OSA (obstructive sleep apnea) 07/12/2015  . Low serum testosterone level 05/18/2015  . Depression 05/16/2015  . Morbid obesity due to excess calories ([place])   . Chronic pain   . Diabetes ([place])   . CAD (coronary artery disease) 12/24/2014  . Atrial fibrillation ([place]) 12/24/2014  . Essential hypertension 12/24/2014  . Chronic kidney disease 12/24/2014  . PUD (peptic ulcer disease) 12/24/2014    Past Surgical History:  Procedure Laterality Date  .  CARDIAC CATHETERIZATION  05/2014   ablation for atrial fibrillation  . CARDIAC CATHETERIZATION N/A 11/16/2015   Procedure: Left Heart Cath and Coronary Angiography;  Surgeon: Leonie Man, MD;  Location: [facility];  Service: Cardiovascular;  Laterality: N/A;  . COLON RESECTION  09/2013   due to large, abnormal polpy. non cancerous per patient.   . COLON SURGERY  09/2014   colon resection   . LEFT HEART CATH AND CORONARY ANGIOGRAPHY N/A  05/19/2018   Procedure: LEFT HEART CATH AND CORONARY ANGIOGRAPHY;  Surgeon: Wellington Hampshire, MD;  Location: [facility];  Service: Cardiovascular;  Laterality: N/A;       Family History  Problem Relation Age of Onset  . Hypertension Mother   . Diabetes Mother   . COPD Mother   . Lung cancer Mother        Smoker   . Hypertension Father   . Diabetes Father   . Heart Problems Father   . COPD Father   . Colon cancer Neg Hx   . Stomach cancer Neg Hx     Social History   Tobacco Use  . Smoking status: Former Smoker    Packs/day: 0.50    Years: 10.00    Pack years: 5.00    Types: Cigarettes    Quit date: 04/11/2008    Years since quitting: 11.6  . Smokeless tobacco: Never Used  Substance Use Topics  . Alcohol use: No    Alcohol/week: 0.0 standard drinks  . Drug use: No    Home Medications Prior to Admission medications   Medication Sig Start Date End Date Taking? Authorizing Provider  zolpidem (AMBIEN CR) 6.25 MG CR tablet TAKE 1 TABLET BY MOUTH EVERY DAY AT BEDTIME AS NEEDED FOR SLEEP 12/02/19   [name], MD  Accu-Chek FastClix Lancets MISC Use one strip to monitor glucose levels BID; E11.42 12/06/18   Renato Shin, MD  albuterol (PROVENTIL HFA;VENTOLIN HFA) 108 (90 Base) MCG/ACT inhaler Inhale 2 puffs into the lungs every 6 (six) hours as needed for wheezing or shortness of breath. 04/29/18   [name], MD  allopurinol (ZYLOPRIM) 100 MG tablet Take 100 mg by mouth daily.    [provider]  atorvastatin (LIPITOR) 40 MG tablet Take 1 tablet (40 mg total) by mouth daily. 09/27/19   Sueanne Margarita, MD  carvedilol (COREG) 12.5 MG tablet Take 1 tablet (12.5 mg total) by mouth 2 (two) times daily with a meal. 09/12/19   Larey Dresser, MD  diclofenac Sodium (VOLTAREN) 1 % GEL Apply 4 g topically 4 (four) times daily. 10/23/19   Darr, Marguerita Beards, [name]  doxycycline (VIBRAMYCIN) 100 MG capsule Take 1 capsule (100 mg total) by mouth 2 (two) times daily.  10/23/19   Darr, Marguerita Beards, [name]  DULoxetine (CYMBALTA) 20 MG capsule Take 1 capsule by mouth once daily 06/15/19   [name], MD  EQ ASPIRIN ADULT LOW DOSE 81 MG EC tablet TAKE 1 TABLET BY MOUTH ONCE DAILY. SWALLOW WHOLE 08/01/19   [name], MD  famotidine (PEPCID) 20 MG tablet Take 20 mg by mouth 2 (two) times daily.    [provider]  fluticasone (FLONASE) 50 MCG/ACT nasal spray Place 2 sprays into both nostrils daily. 09/16/19   [name], MD  furosemide (LASIX) 40 MG tablet Take 1 tablet (40 mg total) by mouth 2 (two) times daily. 07/13/18 09/26/19  [name] T, [name]  glucose blood (ACCU-CHEK  GUIDE) test strip Use one strip to monitor glucose levels BID; E11.42 12/06/18   Renato Shin, MD  hydrALAZINE (APRESOLINE) 50 MG tablet TAKE 1 TABLET BY MOUTH EVERY 8 HOURS 10/24/19   [name], MD  HYDROcodone-acetaminophen (NORCO) 10-325 MG tablet Take 1 tablet by mouth every 8 (eight) hours. 05/05/19   [provider]  Insulin Lispro Prot & Lispro (HUMALOG MIX 75/25 KWIKPEN) (75-25) 100 UNIT/ML Kwikpen Inject 150 Units into the skin daily with breakfast. 07/29/19   Renato Shin, MD  Insulin Pen Needle (PEN NEEDLES) 31G X 6 MM MISC 1 each by Does not apply route daily. E11.9 10/19/19   Renato Shin, MD  isosorbide mononitrate (IMDUR) 30 MG 24 hr tablet Take 1 tablet (30 mg total) by mouth daily. 09/12/19   Sueanne Margarita, MD  Potassium Chloride ER 20 MEQ TBCR Take 20 mEq by mouth daily. 09/08/19 09/07/20  Bensimhon, Shaune Pascal, MD  pregabalin (LYRICA) 100 MG capsule Take 100 mg by mouth 2 (two) times daily.    [provider]  sacubitril-valsartan (ENTRESTO) 97-103 MG Take 1 tablet by mouth 2 (two) times daily. 10/24/19   Bensimhon, Shaune Pascal, MD  Secukinumab (COSENTYX) 150 MG/ML SOSY Inject 150 mg into the skin every 28 (twenty-eight) days.  06/02/16   [provider]  Tafamidis (VYNDAMAX) 61 MG CAPS Take 61 mg by mouth daily. 09/08/19    Bensimhon, Shaune Pascal, MD    Allergies    Nsaids  Review of Systems   Review of Systems  All other systems reviewed and are negative.   Physical Exam Updated Vital Signs BP (!) 87/64   Pulse 70   Temp 100.1 F (37.8 C) (Rectal)   Resp 17   Ht 5\' 6"  (1.676 m)   Wt 108.9 kg   SpO2 95%   BMI 38.74 kg/m   Physical Exam Vitals and nursing note reviewed.  Constitutional:      General: He is not in acute distress.    Appearance: He is well-developed. He is ill-appearing. He is not diaphoretic.     Comments: Patient is somnolent.  He is ill-appearing and somewhat difficult to arouse.  HENT:     Head: Normocephalic and atraumatic.  Eyes:     Extraocular Movements: Extraocular movements intact.     Pupils: Pupils are equal, round, and reactive to light.     Comments: Pupils are 2 mm and reactive.  Cardiovascular:     Rate and Rhythm: Normal rate and regular rhythm.     Heart sounds: No murmur. No friction rub.  Pulmonary:     Effort: Pulmonary effort is normal. No respiratory distress.     Breath sounds: Normal breath sounds. No wheezing or rales.  Abdominal:     General: Bowel sounds are normal. There is no distension.     Palpations: Abdomen is soft.     Tenderness: There is no abdominal tenderness.  Musculoskeletal:        General: Normal range of motion.     Cervical back: Normal range of motion and neck supple.  Skin:    General: Skin is warm and dry.  Neurological:     Mental Status: He is alert.     Coordination: Coordination normal.     Comments: Patient is somnolent but arousable.  He responds to questions, then drifts back off to sleep.  Complete neurologic exam is difficult, but he does move all extremities with purpose.     ED Results / Procedures /  Treatments   Labs (all labs ordered are listed, but only abnormal results are displayed) Labs Reviewed  CBC WITH DIFFERENTIAL/PLATELET - Abnormal; Notable for the following components:      Result Value    WBC 17.9 (*)    RBC 3.65 (*)    Hemoglobin 10.8 (*)    HCT 34.6 (*)    RDW 15.6 (*)    Neutro Abs 14.6 (*)    Monocytes Absolute 1.9 (*)    Abs Immature Granulocytes 0.12 (*)    All other components within normal limits  CULTURE, BLOOD (ROUTINE X 2)  CULTURE, BLOOD (ROUTINE X 2)  URINE CULTURE  LACTIC ACID, PLASMA  LACTIC ACID, PLASMA  COMPREHENSIVE METABOLIC PANEL  APTT  PROTIME-INR  URINALYSIS, ROUTINE W REFLEX MICROSCOPIC    EKG EKG Interpretation  Date/Time:  Thursday December 08 2019 20:12:21 EDT Ventricular Rate:  77 PR Interval:    QRS Duration: 95 QT Interval:  409 QTC Calculation: 463 R Axis:   29 Text Interpretation: Sinus rhythm Low voltage, precordial leads Confirmed by Veryl Speak [phone]) on 12/08/2019 8:56:01 PM   Radiology No results found.  Procedures Procedures (including critical care time)  Medications Ordered in ED Medications  vancomycin (VANCOCIN) IVPB 1000 mg/200 mL premix (has no administration in time range)  piperacillin-tazobactam (ZOSYN) IVPB 3.375 g (has no administration in time range)    ED Course  I have reviewed the triage vital signs and the nursing notes.  Pertinent labs & imaging results that were available during my care of the patient were reviewed by me and considered in my medical decision making (see chart for details).    MDM Rules/Calculators/A&P  Patient brought here by EMS for a 4-day history of increased somnolence and lethargy.  Patient has had decreased p.o. intake during this period of time.  He arrived here febrile with a temp of 101 with soft blood pressures, and a code sepsis was initiated.  Cultures of blood and urine were obtained as was a chest x-ray.  At this point, no definitive cause has been identified.  He was given broad-spectrum antibiotics including vancomycin and Zosyn.  Laboratory studies also show a creatinine of 5.6, which is significantly worse than baseline.  He also has a BUN of 73 indicative  of dehydration.  White count 18,000.  Sepsis IV fluids were administered and the patient will be admitted to the hospitalist service for further work-up.  I have spoken with Dr. Cyd Silence who agrees to admit.  CRITICAL CARE Performed by: Veryl Speak Total critical care time: 45 minutes Critical care time was exclusive of separately billable procedures and treating other patients. Critical care was necessary to treat or prevent imminent or life-threatening deterioration. Critical care was time spent personally by me on the following activities: development of treatment plan with patient and/or surrogate as well as nursing, discussions with consultants, evaluation of patient's response to treatment, examination of patient, obtaining history from patient or surrogate, ordering and performing treatments and interventions, ordering and review of laboratory studies, ordering and review of radiographic studies, pulse oximetry and re-evaluation of patient's condition.   Final Clinical Impression(s) / ED Diagnoses Final diagnoses:  None    Rx / DC Orders ED Discharge Orders    None       Veryl Speak, MD 12/08/19 2223

## 2019-12-08 NOTE — Progress Notes (Signed)
Secure chat with provider regarding additional fluid bolus. He is concerned for CHF and is hesitant at this time to give more fluid.

## 2019-12-08 NOTE — Progress Notes (Signed)
Notified provider of need to order fluid bolus 3,267 ml's based off of his weight of 108.9 kg.

## 2019-12-09 ENCOUNTER — Emergency Department (HOSPITAL_COMMUNITY): Payer: Medicaid Other

## 2019-12-09 ENCOUNTER — Ambulatory Visit: Payer: Medicaid Other | Admitting: Internal Medicine

## 2019-12-09 ENCOUNTER — Encounter (HOSPITAL_COMMUNITY): Payer: Self-pay | Admitting: Internal Medicine

## 2019-12-09 DIAGNOSIS — I5042 Chronic combined systolic (congestive) and diastolic (congestive) heart failure: Secondary | ICD-10-CM

## 2019-12-09 DIAGNOSIS — R0789 Other chest pain: Secondary | ICD-10-CM | POA: Diagnosis present

## 2019-12-09 DIAGNOSIS — L4050 Arthropathic psoriasis, unspecified: Secondary | ICD-10-CM | POA: Diagnosis present

## 2019-12-09 DIAGNOSIS — R10819 Abdominal tenderness, unspecified site: Secondary | ICD-10-CM | POA: Diagnosis present

## 2019-12-09 DIAGNOSIS — N183 Chronic kidney disease, stage 3 unspecified: Secondary | ICD-10-CM

## 2019-12-09 DIAGNOSIS — A4150 Gram-negative sepsis, unspecified: Principal | ICD-10-CM

## 2019-12-09 DIAGNOSIS — N136 Pyonephrosis: Secondary | ICD-10-CM | POA: Diagnosis present

## 2019-12-09 DIAGNOSIS — G8929 Other chronic pain: Secondary | ICD-10-CM | POA: Diagnosis present

## 2019-12-09 DIAGNOSIS — I428 Other cardiomyopathies: Secondary | ICD-10-CM | POA: Diagnosis present

## 2019-12-09 DIAGNOSIS — G9341 Metabolic encephalopathy: Secondary | ICD-10-CM | POA: Diagnosis present

## 2019-12-09 DIAGNOSIS — I130 Hypertensive heart and chronic kidney disease with heart failure and stage 1 through stage 4 chronic kidney disease, or unspecified chronic kidney disease: Secondary | ICD-10-CM | POA: Diagnosis present

## 2019-12-09 DIAGNOSIS — E1122 Type 2 diabetes mellitus with diabetic chronic kidney disease: Secondary | ICD-10-CM

## 2019-12-09 DIAGNOSIS — N179 Acute kidney failure, unspecified: Secondary | ICD-10-CM

## 2019-12-09 DIAGNOSIS — E875 Hyperkalemia: Secondary | ICD-10-CM | POA: Diagnosis present

## 2019-12-09 DIAGNOSIS — Z886 Allergy status to analgesic agent status: Secondary | ICD-10-CM | POA: Diagnosis not present

## 2019-12-09 DIAGNOSIS — B9620 Unspecified Escherichia coli [E. coli] as the cause of diseases classified elsewhere: Secondary | ICD-10-CM | POA: Diagnosis present

## 2019-12-09 DIAGNOSIS — R6520 Severe sepsis without septic shock: Secondary | ICD-10-CM | POA: Diagnosis present

## 2019-12-09 DIAGNOSIS — I4819 Other persistent atrial fibrillation: Secondary | ICD-10-CM | POA: Diagnosis present

## 2019-12-09 DIAGNOSIS — Z79899 Other long term (current) drug therapy: Secondary | ICD-10-CM | POA: Diagnosis not present

## 2019-12-09 DIAGNOSIS — Z86010 Personal history of colonic polyps: Secondary | ICD-10-CM | POA: Diagnosis not present

## 2019-12-09 DIAGNOSIS — E1140 Type 2 diabetes mellitus with diabetic neuropathy, unspecified: Secondary | ICD-10-CM | POA: Diagnosis present

## 2019-12-09 DIAGNOSIS — E11649 Type 2 diabetes mellitus with hypoglycemia without coma: Secondary | ICD-10-CM | POA: Diagnosis present

## 2019-12-09 DIAGNOSIS — R109 Unspecified abdominal pain: Secondary | ICD-10-CM | POA: Diagnosis present

## 2019-12-09 DIAGNOSIS — Z20822 Contact with and (suspected) exposure to covid-19: Secondary | ICD-10-CM | POA: Diagnosis present

## 2019-12-09 DIAGNOSIS — N1832 Chronic kidney disease, stage 3b: Secondary | ICD-10-CM | POA: Diagnosis present

## 2019-12-09 DIAGNOSIS — A419 Sepsis, unspecified organism: Secondary | ICD-10-CM | POA: Diagnosis not present

## 2019-12-09 LAB — CBG MONITORING, ED
Glucose-Capillary: 110 mg/dL — ABNORMAL HIGH (ref 70–99)
Glucose-Capillary: 128 mg/dL — ABNORMAL HIGH (ref 70–99)
Glucose-Capillary: 133 mg/dL — ABNORMAL HIGH (ref 70–99)
Glucose-Capillary: 157 mg/dL — ABNORMAL HIGH (ref 70–99)
Glucose-Capillary: 173 mg/dL — ABNORMAL HIGH (ref 70–99)
Glucose-Capillary: 246 mg/dL — ABNORMAL HIGH (ref 70–99)
Glucose-Capillary: 42 mg/dL — CL (ref 70–99)
Glucose-Capillary: 61 mg/dL — ABNORMAL LOW (ref 70–99)
Glucose-Capillary: 67 mg/dL — ABNORMAL LOW (ref 70–99)
Glucose-Capillary: 73 mg/dL (ref 70–99)
Glucose-Capillary: 76 mg/dL (ref 70–99)
Glucose-Capillary: 78 mg/dL (ref 70–99)

## 2019-12-09 LAB — BASIC METABOLIC PANEL
Anion gap: 10 (ref 5–15)
BUN: 68 mg/dL — ABNORMAL HIGH (ref 8–23)
CO2: 19 mmol/L — ABNORMAL LOW (ref 22–32)
Calcium: 7.6 mg/dL — ABNORMAL LOW (ref 8.9–10.3)
Chloride: 112 mmol/L — ABNORMAL HIGH (ref 98–111)
Creatinine, Ser: 4.1 mg/dL — ABNORMAL HIGH (ref 0.61–1.24)
GFR calc Af Amer: 17 mL/min — ABNORMAL LOW (ref >60)
GFR calc non Af Amer: 14 mL/min — ABNORMAL LOW (ref >60)
Glucose, Bld: 204 mg/dL — ABNORMAL HIGH (ref 70–99)
Potassium: 5.6 mmol/L — ABNORMAL HIGH (ref 3.5–5.1)
Sodium: 141 mmol/L (ref 135–145)

## 2019-12-09 LAB — RAPID URINE DRUG SCREEN, HOSP PERFORMED
Amphetamines: NOT DETECTED
Barbiturates: NOT DETECTED
Benzodiazepines: NOT DETECTED
Cocaine: NOT DETECTED
Opiates: POSITIVE — AB
Tetrahydrocannabinol: NOT DETECTED

## 2019-12-09 LAB — HEMOGLOBIN A1C
Hgb A1c MFr Bld: 8.4 % — ABNORMAL HIGH (ref 4.8–5.6)
Mean Plasma Glucose: 194.38 mg/dL

## 2019-12-09 LAB — URINALYSIS, ROUTINE W REFLEX MICROSCOPIC
Bilirubin Urine: NEGATIVE
Glucose, UA: NEGATIVE mg/dL
Hgb urine dipstick: NEGATIVE
Ketones, ur: NEGATIVE mg/dL
Nitrite: NEGATIVE
Protein, ur: 100 mg/dL — AB
Specific Gravity, Urine: 1.016 (ref 1.005–1.030)
WBC, UA: >50 WBC/hpf — ABNORMAL HIGH (ref 0–5)
pH: 5 (ref 5.0–8.0)

## 2019-12-09 LAB — IRON AND TIBC
Iron: 30 ug/dL — ABNORMAL LOW (ref 45–182)
Saturation Ratios: 19 % (ref 17.9–39.5)
TIBC: 158 ug/dL — ABNORMAL LOW (ref 250–450)
UIBC: 128 ug/dL

## 2019-12-09 LAB — RPR: RPR Ser Ql: NONREACTIVE

## 2019-12-09 LAB — HIV ANTIBODY (ROUTINE TESTING W REFLEX): HIV Screen 4th Generation wRfx: NONREACTIVE

## 2019-12-09 LAB — AMMONIA: Ammonia: 26 umol/L (ref 9–35)

## 2019-12-09 LAB — VITAMIN B12: Vitamin B-12: 104 pg/mL — ABNORMAL LOW (ref 180–914)

## 2019-12-09 LAB — RENAL FUNCTION PANEL
Albumin: 2.3 g/dL — ABNORMAL LOW (ref 3.5–5.0)
Anion gap: 12 (ref 5–15)
BUN: 68 mg/dL — ABNORMAL HIGH (ref 8–23)
CO2: 20 mmol/L — ABNORMAL LOW (ref 22–32)
Calcium: 7.7 mg/dL — ABNORMAL LOW (ref 8.9–10.3)
Chloride: 109 mmol/L (ref 98–111)
Creatinine, Ser: 5.22 mg/dL — ABNORMAL HIGH (ref 0.61–1.24)
GFR calc Af Amer: 12 mL/min — ABNORMAL LOW (ref >60)
GFR calc non Af Amer: 11 mL/min — ABNORMAL LOW (ref >60)
Glucose, Bld: 55 mg/dL — ABNORMAL LOW (ref 70–99)
Phosphorus: 5.2 mg/dL — ABNORMAL HIGH (ref 2.5–4.6)
Potassium: 4.8 mmol/L (ref 3.5–5.1)
Sodium: 141 mmol/L (ref 135–145)

## 2019-12-09 LAB — TSH: TSH: 1.102 u[IU]/mL (ref 0.350–4.500)

## 2019-12-09 LAB — PROCALCITONIN: Procalcitonin: 23.81 ng/mL

## 2019-12-09 LAB — FOLATE: Folate: 12.4 ng/mL (ref >5.9)

## 2019-12-09 LAB — TROPONIN I (HIGH SENSITIVITY): Troponin I (High Sensitivity): 32 ng/L — ABNORMAL HIGH (ref <18)

## 2019-12-09 MED ORDER — DEXTROSE-NACL 5-0.45 % IV SOLN: INTRAVENOUS | Status: DC

## 2019-12-09 MED ORDER — HYDROCORTISONE NA SUCCINATE PF 100 MG IJ SOLR
50.0000 mg | Freq: Four times a day (QID) | INTRAMUSCULAR | Status: DC
  Administered 2019-12-09 – 2019-12-10 (×6): 50 mg via INTRAVENOUS
  Filled 2019-12-09 (×6): qty 2

## 2019-12-09 MED ORDER — METRONIDAZOLE IN NACL 5-0.79 MG/ML-% IV SOLN
500.0000 mg | Freq: Three times a day (TID) | INTRAVENOUS | Status: DC
  Administered 2019-12-09 – 2019-12-10 (×4): 500 mg via INTRAVENOUS
  Filled 2019-12-09 (×4): qty 100

## 2019-12-09 MED ORDER — ONDANSETRON HCL 4 MG/2ML IJ SOLN
4.0000 mg | Freq: Four times a day (QID) | INTRAMUSCULAR | Status: DC | PRN
  Filled 2019-12-09: qty 2

## 2019-12-09 MED ORDER — SODIUM CHLORIDE 0.9 % IV SOLN: INTRAVENOUS | Status: DC

## 2019-12-09 MED ORDER — ASPIRIN EC 81 MG PO TBEC
81.0000 mg | DELAYED_RELEASE_TABLET | Freq: Every day | ORAL | Status: DC
  Administered 2019-12-09 – 2019-12-12 (×4): 81 mg via ORAL
  Filled 2019-12-09 (×8): qty 1

## 2019-12-09 MED ORDER — ACETAMINOPHEN 325 MG PO TABS
650.0000 mg | ORAL_TABLET | Freq: Four times a day (QID) | ORAL | Status: DC | PRN
  Filled 2019-12-09: qty 2

## 2019-12-09 MED ORDER — INSULIN ASPART 100 UNIT/ML ~~LOC~~ SOLN
0.0000 [IU] | Freq: Four times a day (QID) | SUBCUTANEOUS | Status: DC
  Filled 2019-12-09: qty 0.15

## 2019-12-09 MED ORDER — PANTOPRAZOLE SODIUM 40 MG IV SOLR
40.0000 mg | INTRAVENOUS | Status: DC
  Administered 2019-12-09 – 2019-12-11 (×3): 40 mg via INTRAVENOUS
  Filled 2019-12-09 (×3): qty 40

## 2019-12-09 MED ORDER — VANCOMYCIN HCL IN DEXTROSE 1-5 GM/200ML-% IV SOLN
1000.0000 mg | Freq: Once | INTRAVENOUS | Status: AC
  Administered 2019-12-09: 1000 mg via INTRAVENOUS
  Filled 2019-12-09: qty 200

## 2019-12-09 MED ORDER — ONDANSETRON HCL 4 MG PO TABS: 4.0000 mg | ORAL_TABLET | Freq: Four times a day (QID) | ORAL | Status: DC | PRN

## 2019-12-09 MED ORDER — SODIUM CHLORIDE 0.9 % IV SOLN
2.0000 g | INTRAVENOUS | Status: DC
  Administered 2019-12-09 – 2019-12-10 (×2): 2 g via INTRAVENOUS
  Filled 2019-12-09 (×3): qty 2

## 2019-12-09 MED ORDER — INSULIN ASPART 100 UNIT/ML ~~LOC~~ SOLN
0.0000 [IU] | Freq: Four times a day (QID) | SUBCUTANEOUS | Status: DC
  Filled 2019-12-09: qty 0.2

## 2019-12-09 MED ORDER — HEPARIN SODIUM (PORCINE) 5000 UNIT/ML IJ SOLN
5000.0000 [IU] | Freq: Three times a day (TID) | INTRAMUSCULAR | Status: DC
  Administered 2019-12-09 – 2019-12-13 (×13): 5000 [IU] via SUBCUTANEOUS
  Filled 2019-12-09 (×13): qty 1

## 2019-12-09 MED ORDER — VANCOMYCIN HCL IN DEXTROSE 1-5 GM/200ML-% IV SOLN: 1000.0000 mg | Freq: Once | INTRAVENOUS | Status: DC

## 2019-12-09 MED ORDER — VANCOMYCIN VARIABLE DOSE PER UNSTABLE RENAL FUNCTION (PHARMACIST DOSING): Status: DC

## 2019-12-09 MED ORDER — ATORVASTATIN CALCIUM 40 MG PO TABS
40.0000 mg | ORAL_TABLET | Freq: Every day | ORAL | Status: DC
  Administered 2019-12-10 – 2019-12-13 (×4): 40 mg via ORAL
  Filled 2019-12-09 (×4): qty 1

## 2019-12-09 MED ORDER — ACETAMINOPHEN 650 MG RE SUPP: 650.0000 mg | Freq: Four times a day (QID) | RECTAL | Status: DC | PRN

## 2019-12-09 MED ORDER — DEXTROSE 50 % IV SOLN
1.0000 | Freq: Once | INTRAVENOUS | Status: AC
  Administered 2019-12-09: 50 mL via INTRAVENOUS
  Filled 2019-12-09: qty 50

## 2019-12-09 MED ORDER — INSULIN ASPART 100 UNIT/ML ~~LOC~~ SOLN
0.0000 [IU] | SUBCUTANEOUS | Status: DC
  Administered 2019-12-09: 4 [IU] via SUBCUTANEOUS
  Administered 2019-12-10 (×3): 7 [IU] via SUBCUTANEOUS
  Administered 2019-12-10: 4 [IU] via SUBCUTANEOUS
  Filled 2019-12-09: qty 0.2

## 2019-12-09 NOTE — H&P (Signed)
History and Physical    Marco [name] [id number] DOB: 10-31-1955 DOA: 12/08/2019  PCP: [name], MD  Patient coming from:Home   Chief Complaint:   Confusion/Lethargy  HPI:    64 year old male with past medical history of systolic and diastolic congestive heart failure, nonischemic cardiomyopathy, chronic kidney disease stage III, diabetes mellitus type 2, coronary artery disease, hypertension, obesity, obstructive sleep apnea, atrial fibrillation status post ablation 2017, psoriatic arthritis on daily prednisone therapy who presents to [facility] emergency department with a several day history of rapidly progressive somnolence.  Patient is an extremely poor historian and minimally responsive and therefore the majority the history is been obtained from the wife.  The wife explains that last week for approximately 2 days the patient was experiencing watery diarrhea.  This diarrhea seemed to resolve on its own.  Then, this past Saturday the patient began to seem more and more sleepy.  This sleepiness and weakness rapidly progressed over the next several days, to the point where the patient was trembling and could not even walk.  Is continue to worsen until on 4/29 the patient was minimally responsive.  The patient was not taking any p.o. intake.  Patient did not complain of any shortness of breath, cough, dysuria or abdominal pain.  Patient did not exhibit any fevers.  Patient has not undergone any recent travel has, no contact with anyone with confirmed COVID-19.  Patient was eventually brought into [facility] emergency department via EMS for evaluation.  Upon evaluation in the emergency department patient was found to be hypotensive with substantial leukocytosis concerning for sepsis and impending septic shock.  Patient was initiated on broad-spectrum intravenous antibiotic therapy with vancomycin and Zosyn.  Patient was hydrated with 30 cc/kg of isotonic fluids.  Chest  x-ray revealed no evidence of definitive pneumonia.  Urinalysis was abnormal with notable leukocyte esterase and greater than 50 white blood cells per high-powered field.  Hospitalist group was then called to assess the patient for admission the hospital.    Review of Systems: Unable to obtain due to patient's severe somnolence.  Past Medical History:  Diagnosis Date  . Benign colon polyp 08/01/2013   [facility] in [place]. large base tranverse colon polyp was biopsied. polyp was benign with minimal surface hyperplastic change.  . Chronic combined systolic and diastolic CHF (congestive heart failure) ([place])   . Chronic pain   . CKD (chronic kidney disease), stage III   . Diabetes mellitus without complication ([place])   . Diverticulosis   . Esophageal hiatal hernia 07/29/2013   confirmed on EGD   . Esophageal stricture   . Essential hypertension   . Gastritis 07/29/2013   confirmed on EGD, bx done an negative for intestinal metaplasia, dsyplasia or H. pylori. normal gastric emptying study done 07/13/2013.  [name] GERD (gastroesophageal reflux disease)   . Morbid obesity ([place])   . Non-obstructive CAD    a. 02/2013 Cath ([place]): nonobs dzs;  b. 09/2014 Myoview ([place]): EF 55%, no ischemia;  c. Cath 05/2018 mild CAD no obstruction.  . Persistent atrial fibrillation ([place])    a. 02/2013 s/p rfca in [place], [place]-->prev on Xarelto, d/c'd 2/2 anemia, ? GIB.  [name] Rheumatoid arthritis ([place])   . Sigmoid diverticulosis 08/01/2013   confirmed on colonscopy. record scanned into chart    Past Surgical History:  Procedure Laterality Date  . CARDIAC CATHETERIZATION  05/2014   ablation for atrial fibrillation  . CARDIAC CATHETERIZATION N/A 11/16/2015   Procedure:  Left Heart Cath and Coronary Angiography;  Surgeon: Leonie Man, MD;  Location: [place] [facility];  Service: Cardiovascular;  Laterality: N/A;  . COLON RESECTION  09/2013   due to large, abnormal polpy. non cancerous per patient.   .  COLON SURGERY  09/2014   colon resection   . LEFT HEART CATH AND CORONARY ANGIOGRAPHY N/A 05/19/2018   Procedure: LEFT HEART CATH AND CORONARY ANGIOGRAPHY;  Surgeon: Wellington Hampshire, MD;  Location: [place] [facility];  Service: Cardiovascular;  Laterality: N/A;     reports that he quit smoking about 11 years ago. His smoking use included cigarettes. He has a 5.00 pack-year smoking history. He has never used smokeless tobacco. He reports that he does not drink alcohol or use drugs.  Allergies  Allergen Reactions  . Nsaids Other (See Comments)    Stomach pains. Ulcers - stated by patient     Family History  Problem Relation Age of Onset  . Hypertension Mother   . Diabetes Mother   . COPD Mother   . Lung cancer Mother        Smoker   . Hypertension Father   . Diabetes Father   . Heart Problems Father   . COPD Father   . Colon cancer Neg Hx   . Stomach cancer Neg Hx      Prior to Admission medications   Medication Sig Start Date End Date Taking? Authorizing Provider  allopurinol (ZYLOPRIM) 100 MG tablet Take 200 mg by mouth every evening.    Yes [provider]  atorvastatin (LIPITOR) 40 MG tablet Take 1 tablet (40 mg total) by mouth daily. 09/27/19  Yes Sueanne Margarita, MD  carvedilol (COREG) 12.5 MG tablet Take 1 tablet (12.5 mg total) by mouth 2 (two) times daily with a meal. 09/12/19  Yes Larey Dresser, MD  DULoxetine (CYMBALTA) 20 MG capsule Take 1 capsule by mouth once daily 06/15/19  Yes [name], MD  EQ ASPIRIN ADULT LOW DOSE 81 MG EC tablet TAKE 1 TABLET BY MOUTH ONCE DAILY. SWALLOW WHOLE Patient taking differently: Take 81 mg by mouth at bedtime.  08/01/19  Yes [name], MD  famotidine (PEPCID) 20 MG tablet Take 20 mg by mouth 2 (two) times daily.   Yes [provider]  furosemide (LASIX) 80 MG tablet Take 40 mg by mouth 2 (two) times daily.  11/09/19  Yes [provider]  hydrALAZINE (APRESOLINE) 50 MG tablet TAKE 1 TABLET  BY MOUTH EVERY 8 HOURS 10/24/19  Yes [name], MD  Insulin Lispro Prot & Lispro (HUMALOG MIX 75/25 KWIKPEN) (75-25) 100 UNIT/ML Kwikpen Inject 150 Units into the skin daily with breakfast. Patient taking differently: Inject 160 Units into the skin daily with breakfast.  07/29/19  Yes Renato Shin, MD  isosorbide mononitrate (IMDUR) 30 MG 24 hr tablet Take 1 tablet (30 mg total) by mouth daily. 09/12/19  Yes Turner, Eber Hong, MD  oxyCODONE (OXY IR/ROXICODONE) 5 MG immediate release tablet Take 5 mg by mouth every 6 (six) hours. 11/09/19  Yes [provider]  potassium chloride SA (KLOR-CON) 20 MEQ tablet Take 20 mEq by mouth daily. 12/05/19  Yes [provider]  predniSONE (DELTASONE) 5 MG tablet Take 5 mg by mouth daily. 11/23/19  Yes [provider]  pregabalin (LYRICA) 100 MG capsule Take 100 mg by mouth 3 (three) times daily.    Yes [provider]  sacubitril-valsartan (ENTRESTO) 97-103 MG Take 1 tablet  by mouth 2 (two) times daily. 10/24/19  Yes Bensimhon, Shaune Pascal, MD  Secukinumab (COSENTYX) 150 MG/ML SOSY Inject 150 mg into the skin every 28 (twenty-eight) days.  06/02/16  Yes [provider]  Tafamidis (VYNDAMAX) 61 MG CAPS Take 61 mg by mouth daily. 09/08/19  Yes Bensimhon, Shaune Pascal, MD  zolpidem (AMBIEN CR) 6.25 MG CR tablet TAKE 1 TABLET BY MOUTH EVERY DAY AT BEDTIME AS NEEDED FOR SLEEP Patient taking differently: Take 6.25 mg by mouth at bedtime.  12/02/19  Yes [name], MD  Accu-Chek FastClix Lancets MISC Use one strip to monitor glucose levels BID; E11.42 12/06/18   Renato Shin, MD  albuterol (PROVENTIL HFA;VENTOLIN HFA) 108 (90 Base) MCG/ACT inhaler Inhale 2 puffs into the lungs every 6 (six) hours as needed for wheezing or shortness of breath. Patient not taking: Reported on 12/08/2019 04/29/18   [name], MD  diclofenac Sodium (VOLTAREN) 1 % GEL Apply 4 g topically 4 (four) times daily. Patient not taking: Reported on  12/08/2019 10/23/19   Darr, Marguerita Beards, [name]  doxycycline (VIBRAMYCIN) 100 MG capsule Take 1 capsule (100 mg total) by mouth 2 (two) times daily. Patient not taking: Reported on 12/08/2019 10/23/19   Darr, Marguerita Beards, [name]  fluticasone [facility]) 50 MCG/ACT nasal spray Place 2 sprays into both nostrils daily. Patient not taking: Reported on 12/08/2019 09/16/19   [name], MD  furosemide (LASIX) 40 MG tablet Take 1 tablet (40 mg total) by mouth 2 (two) times daily. 07/13/18 09/26/19  [name] T, [name]  glucose blood (ACCU-CHEK GUIDE) test strip Use one strip to monitor glucose levels BID; E11.42 12/06/18   Renato Shin, MD  Insulin Pen Needle (PEN NEEDLES) 31G X 6 MM MISC 1 each by Does not apply route daily. E11.9 10/19/19   Renato Shin, MD  methocarbamol (ROBAXIN) 750 MG tablet Take 750 mg by mouth every 12 (twelve) hours.  11/09/19   [provider]  Potassium Chloride ER 20 MEQ TBCR Take 20 mEq by mouth daily. Patient not taking: Reported on 12/08/2019 09/08/19 09/07/20  Jolaine Artist, MD    Physical Exam: Vitals:   12/09/19 0030 12/09/19 0100 12/09/19 0130 12/09/19 0136  BP: (!) 102/58 94/65 103/67 103/67  Pulse: 70 69 70 69  Resp: 10 15 16 16   Temp:      TempSrc:      SpO2: 96% 96% 93% 96%  Weight:      Height:         Constitutional: Severe somnolence and minimally responsive.  Patient is obese. Skin: no rashes, no lesions, poor skin turgor noted. Eyes: Pupils are equally reactive to light.  No evidence of scleral icterus or conjunctival pallor.  ENMT: Extremely dry mucous membranes noted.  Posterior pharynx clear of any exudate or lesions. Normal dentition.   Neck: normal, supple, no masses, no thyromegaly Respiratory: Diminished breath sounds at the bases.  No evidence of wheezing or rales.  Normal respiratory effort. No accessory muscle use.  Cardiovascular: Regular rate and rhythm, no murmurs / rubs / gallops.  Trace distal bilateral lower extremity pitting edema.   2+ pedal pulses. No carotid bruits.  Back:   Nontender without crepitus or deformity. Abdomen: Notable diffuse abdominal tenderness.  Abdomen is soft however with positive bowel sounds.   No evidence of intra-abdominal masses.   Musculoskeletal: No joint deformity upper and lower extremities. Good ROM, no contractures. Normal muscle tone.  Neurologic: Patient is minimally responsive to both verbal  and painful stimuli.  Is only intermittently following commands with stimulation.  Sensation seems to be grossly intact. Psychiatric: Unable to assess due to severe lethargy and minimal responsiveness.   Labs on Admission: I have personally reviewed following labs and imaging studies -   CBC: Recent Labs  Lab 12/08/19 2014  WBC 17.9*  NEUTROABS 14.6*  HGB 10.8*  HCT 34.6*  MCV 94.8  PLT [id number]   Basic Metabolic Panel: Recent Labs  Lab 12/08/19 2014  NA 142  K 5.8*  CL 108  CO2 22  GLUCOSE 111*  BUN 73*  CREATININE 5.61*  CALCIUM 8.2*   GFR: Estimated Creatinine Clearance: 15.4 mL/min (A) (by C-G formula based on SCr of 5.61 mg/dL (H)). Liver Function Tests: Recent Labs  Lab 12/08/19 2014  AST 24  ALT 20  ALKPHOS 45  BILITOT 0.8  PROT 7.5  ALBUMIN 2.7*   No results for input(s): LIPASE, AMYLASE in the last 168 hours. No results for input(s): AMMONIA in the last 168 hours. Coagulation Profile: Recent Labs  Lab 12/08/19 2014  INR 1.3*   Cardiac Enzymes: No results for input(s): CKTOTAL, CKMB, CKMBINDEX, TROPONINI in the last 168 hours. BNP (last 3 results) No results for input(s): PROBNP in the last 8760 hours. HbA1C: No results for input(s): HGBA1C in the last 72 hours. CBG: No results for input(s): GLUCAP in the last 168 hours. Lipid Profile: No results for input(s): CHOL, HDL, LDLCALC, TRIG, CHOLHDL, LDLDIRECT in the last 72 hours. Thyroid Function Tests: No results for input(s): TSH, T4TOTAL, FREET4, T3FREE, THYROIDAB in the last 72 hours. Anemia Panel: No  results for input(s): VITAMINB12, FOLATE, FERRITIN, TIBC, IRON, RETICCTPCT in the last 72 hours. Urine analysis:    Component Value Date/Time   COLORURINE AMBER (A) 12/08/2019 2014   APPEARANCEUR CLOUDY (A) 12/08/2019 2014   LABSPEC 1.016 12/08/2019 2014   PHURINE 5.0 12/08/2019 2014   GLUCOSEU NEGATIVE 12/08/2019 2014   HGBUR NEGATIVE 12/08/2019 2014   BILIRUBINUR NEGATIVE 12/08/2019 2014   BILIRUBINUR negative 10/20/2018 1533   BILIRUBINUR negative 01/29/2018 [place] 12/08/2019 2014   PROTEINUR 100 (A) 12/08/2019 2014   UROBILINOGEN 0.2 10/20/2018 1533   UROBILINOGEN 0.2 04/14/2015 1932   NITRITE NEGATIVE 12/08/2019 2014   LEUKOCYTESUR LARGE (A) 12/08/2019 2014    Radiological Exams on Admission personally reviewed: DG Chest Port 1 View  Result Date: 12/08/2019 CLINICAL DATA:  Altered mental status, minimally responsive since Sunday. EXAM: PORTABLE CHEST 1 VIEW COMPARISON:  September 05, 2018 FINDINGS: There is no evidence of acute infiltrate, pleural effusion or pneumothorax. The heart size and mediastinal contours are within normal limits. Mild to moderate severity degenerative changes are again seen within the mid to lower thoracic spine. IMPRESSION: No active disease. Electronically Signed   By: Virgina Norfolk M.D.   On: 12/08/2019 21:22    EKG: Personally reviewed.  Rhythm is normal sinus rhythm at 77 bpm.  No dynamic ST segment changes appreciated.  Assessment/Plan   Sepsis due to gram-negative bacteria [facility])   Patient presenting with several day history of rapidly progressive lethargy and weakness and is currently minimally responsive here in the emergency department  Patient is exhibiting substantial leukocytosis and bouts of substantial hypotension here in the emergency department concerning for impending sepsis despite the true presence of SIRS.  Patient is hypotensive despite administration of 30 cc/kg of isotonic fluids.  However, MAPS remain  above 65 and therefore vasopressors not indicated at this time.  Initiating patient on  broad-spectrum intravenous antibiotic therapy with cefepime, vancomycin and metronidazole  Continue to hydrate patient with continuous isotonic saline infusion  Blood and urine cultures have been obtained  At this time, the most likely source is felt to be urinary considering the abnormal urinalysis.  Additionally, patient exhibits generalized abdominal tenderness on examination and therefore CT imaging of the abdomen and pelvis will be performed.  Finally, wife confirms that patient takes daily prednisone in the outpatient setting for psoriatic arthritis.  Long-term use of steroids may have inadvertently caused adrenal insufficiency and therefore I will administer stress dose steroids to this patient -  hydrocortisone 50 mg IV every 6 hours for now.  Patient is being admitted to the stepdown unit and will be monitored closely for any progression of hemodynamic compromise.  Active Problems:   Acute renal failure superimposed on stage 3 chronic kidney disease ([place])   Patient is presenting with severe acute kidney injury superimposed on known history of chronic kidney disease stage III  This is complicated by concurrent mild hyperkalemia without EKG changes  Patient with intravenous isotonic fluids  Obtaining CT imaging of the abdomen pelvis to identify any evidence of pyelonephritis or hydronephrosis or retained stone  Strict input and output monitoring  Monitor renal function and electrolytes with serial chemistry  Minimizing nephrotoxic agents including temporarily holding home regimen of diuretics.    Acute metabolic encephalopathy   Patient presenting with profound lethargy and minimal responsiveness upon arrival to the emergency department  This is most likely secondary to underlying infection but may additionally be affected by the fact the patient is receiving multiple sedating  agents  And holding quite a few of patient's home medications that have the potential for sedation.  Treating underlying infection with broad-spectrum antibiotic therapy  Hydrating patient with intravenous isotonic fluids  Obtaining CT imaging of the head  Pending vitamin B12, folate, TSH, ammonia, hepatic function panel  Monitoring for symptomatic improvement    Hyperkalemia   Mild hyperkalemia without evidence of EKG changes  This should hopefully resolve with aggressive intravenous fluid resuscitations  Obtaining repeat chemistry now status post large volume of isotonic fluid bolus  Monitoring patient on telemetry  If potassium levels are rising we will initiate usual cocktail of medications to rapidly reduce potassium    Type 2 diabetes mellitus with stage 3 chronic kidney disease, without long-term current use of insulin (HCC)   Currently keeping patient n.p.o. due to severe obtundation  Performing Accu-Cheks every 6 hours with high intensity sliding scale as patient typically receives a very large amount of insulin in the outpatient setting  Obtaining hemoglobin A1c  Temporarily holding home regimen of basal insulin    Chronic combined systolic and diastolic CHF (congestive heart failure) (HCC)   Longstanding known history of of nonischemic cardiomyopathy with systolic and diastolic congestive heart failure  Holding patient's home regimen of Entresto and beta-blocker  No evidence of cardiogenic volume overload at this time     Code Status:  Full code Family Communication: Discussed at length with the wife over the phone  Status is: Inpatient  Remains inpatient appropriate because:Hemodynamically unstable   Dispo: The patient is from: Home              Anticipated d/c is to: Home              Anticipated d/c date is: > 3 days              Patient currently is not medically stable  to d/c.         Vernelle Emerald MD [facility] Pager  [phone]  If 7PM-7AM, please contact night-coverage [email or web address] Use universal [place] password for that web site. If you do not have the password, please call the hospital operator.  12/09/2019, 2:13 AM

## 2019-12-09 NOTE — ED Notes (Signed)
Patient's wife updated.

## 2019-12-09 NOTE — ED Notes (Signed)
Pt assisted up to the [facility] with x2 assist. Pt able to assist himself up in the bed but needs assist getting out of bed. Pt unable to have BM at this time.

## 2019-12-09 NOTE — ED Notes (Signed)
Patient placed in hospital bed for comfort.

## 2019-12-09 NOTE — ED Notes (Signed)
Paged Lonny Prude, MD and made aware patient lethargic and more confused.   CBG-61

## 2019-12-09 NOTE — ED Notes (Signed)
[name], wife would like an update on her husband, [phone].

## 2019-12-09 NOTE — Progress Notes (Signed)
Pharmacy Antibiotic Note  Marco [name] is a 64 y.o. male admitted on 12/08/2019 with sepsis.  Pharmacy has been consulted for cefepime and vancomycin dosing.  Plan: Cefepime 2 Gm IV q24h Vancomycin 1 Gm ED 2205 + 1 Gm now = 2 Gm load F/u VR to determine further doses as Scr = 5.61 F/u scr/cultures  Height: 5\' 6"  (167.6 cm) Weight: 108.9 kg (240 lb) IBW/kg (Calculated) : 63.8  Temp (24hrs), Avg:98.8 F (37.1 C), Min:97.5 F (36.4 C), Max:100.1 F (37.8 C)  Recent Labs  Lab 12/08/19 2014  WBC 17.9*  CREATININE 5.61*  LATICACIDVEN 1.4    Estimated Creatinine Clearance: 15.4 mL/min (A) (by C-G formula based on SCr of 5.61 mg/dL (H)).    Allergies  Allergen Reactions  . Nsaids Other (See Comments)    Stomach pains. Ulcers - stated by patient     Antimicrobials this admission: 4/29 zosyn >> x1 ED 4/29 cefepime >>  4/29 vancomycin >>  Dose adjustments this admission:   Microbiology results:  BCx:   UCx:    Sputum:    MRSA PCR:   Thank you for allowing pharmacy to be a part of this patient's care.  Dorrene German 12/09/2019 2:02 AM

## 2019-12-09 NOTE — ED Notes (Signed)
Pt provided with peri care by Jacob Moores, NT and Probation officer. Pt provided with peri care due to pt's condom cath not working properly. Male purewick applied to pt. Pt repositioned in bed and call bell within reach.

## 2019-12-09 NOTE — ED Notes (Signed)
Spoke to [place], MD and made aware of CBG trending down. MD will take a look at patient and order D5 drip.

## 2019-12-09 NOTE — ED Notes (Signed)
Spoke to [place], MD and made aware of CBG-78. No orders at this tine. Will continue to monitor.

## 2019-12-09 NOTE — Progress Notes (Signed)
PROGRESS NOTE    Marco [name]  [id number] DOB: 10-06-1955 DOA: 12/08/2019 PCP: [name], MD   Brief Narrative: Marco [name] is a 64 y.o. male with past medical history of systolic and diastolic congestive heart failure, nonischemic cardiomyopathy, chronic kidney disease stage III, diabetes mellitus type 2, coronary artery disease, hypertension, obesity, obstructive sleep apnea, atrial fibrillation status post ablation 2017, psoriatic arthritis. Patient presented secondary to confusion and lethargy with concern for sepsis on admission. Started on empiric Vancomycin and Cefepime. Blood and urine cultures pending.   Assessment & Plan:   Principal Problem:   Sepsis due to Gram negative bacteria (HCC) Active Problems:   Type 2 diabetes mellitus with stage 3 chronic kidney disease, without long-term current use of insulin (HCC)   Chronic combined systolic and diastolic CHF (congestive heart failure) (HCC)   Acute renal failure superimposed on stage 3 chronic kidney disease (HCC)   Acute metabolic encephalopathy   Hyperkalemia   Severe sepsis Present on admission. Likely UTI source. Associated AKI and hypotension requiring 30 cc/kg NS bolus. Blood and urine cultures obtained on admission. Vancomycin and Cefepime empirically started. CT abdomen suggests likely right kidney infection. -Continue vancomycin and cefepime -Follow up blood and urine cultures  AKI on CKD stage III Baseline creatinine of 1.5. Creatinine on admission of 5.61. Trending down with IV fluids -Continue IV fluids -Strict in/out  Acute metabolic encephalopathy In setting of sepsis and infection. Improving slowly. CT head negative for acute process  Hypoglycemia In setting of poor oral intake -CBG q4 hours -D5 1/2 NS IV fluids  Diabetes mellitus, type 2 Patient is on Humalog 75/25 150 units daily. Hypoglycemia during admission -SSI  Hyperkalemia Resolved.  Depression On Cymbalta as an  outpatient  Chronic diastolic heart failure Last Transthoracic Echocardiogram from 10/10/2019 significant for normal EF of 55-60%. On Coreg, Entresto, Lasix as an outpatient which were held on admission secondary to sepsis. -Watch fluid status -Daily weights  Essential hypertension Patient is on Coreg, Imdur, hydralazine, Entresto, lasix as an outpatient. Held secondary to sepsis and hypotension on admission.  Neuropathy On Lyrica as an outpatient.  Psoriatic arthritis On prednisone as an outpatient. Started on stress dose steroids on admission for hypotension. -Continue Solu-cortef, transition to prednisone when blood pressure improved   DVT prophylaxis: Heparin subq Code Status:   Code Status: Full Code Family Communication: Wife on telephone Disposition Plan: Discharge home pending continued workup for mental status change in addition to improvement of kidney function   Consultants:   None  Procedures:   None  Antimicrobials:  Vancomycin  Cefepime    Subjective: Feels tired. Hoping to go home soon.  Objective: Vitals:   12/09/19 1600 12/09/19 1618 12/09/19 1630 12/09/19 1700  BP: 111/70 111/70 102/67 106/67  Pulse: 69 69 69 70  Resp: 10 11 11 11   Temp:      TempSrc:      SpO2: 95% 94% 94% 94%  Weight:      Height:        Intake/Output Summary (Last 24 hours) at 12/09/2019 1731 Last data filed at 12/09/2019 1523 Gross per 24 hour  Intake 3200.91 ml  Output 500 ml  Net 2700.91 ml   Filed Weights   12/08/19 2020  Weight: 108.9 kg    Examination:  General exam: Appears calm and comfortable Respiratory system: Clear to auscultation. Respiratory effort normal. Cardiovascular system: S1 & S2 heard, RRR. No murmurs, rubs, gallops or clicks. Gastrointestinal system: Abdomen is nondistended, soft and nontender.  No organomegaly or masses felt. Normal bowel sounds heard. Central nervous system: Somnolent but arouses on his own and is oriented. No focal  neurological deficits. Extremities: No edema. No calf tenderness Skin: No cyanosis. No rashes Psychiatry: Judgement and insight appear normal. Mood & affect appropriate.     Data Reviewed: I have personally reviewed following labs and imaging studies  CBC: Recent Labs  Lab 12/08/19 2014  WBC 17.9*  NEUTROABS 14.6*  HGB 10.8*  HCT 34.6*  MCV 94.8  PLT [id number]   Basic Metabolic Panel: Recent Labs  Lab 12/08/19 2014 12/09/19 0210  NA 142 141  K 5.8* 4.8  CL 108 109  CO2 22 20*  GLUCOSE 111* 55*  BUN 73* 68*  CREATININE 5.61* 5.22*  CALCIUM 8.2* 7.7*  PHOS  --  5.2*   GFR: Estimated Creatinine Clearance: 16.5 mL/min (A) (by C-G formula based on SCr of 5.22 mg/dL (H)). Liver Function Tests: Recent Labs  Lab 12/08/19 2014 12/09/19 0210  AST 24  --   ALT 20  --   ALKPHOS 45  --   BILITOT 0.8  --   PROT 7.5  --   ALBUMIN 2.7* 2.3*   No results for input(s): LIPASE, AMYLASE in the last 168 hours. Recent Labs  Lab 12/09/19 0210  AMMONIA 26   Coagulation Profile: Recent Labs  Lab 12/08/19 2014  INR 1.3*   Cardiac Enzymes: No results for input(s): CKTOTAL, CKMB, CKMBINDEX, TROPONINI in the last 168 hours. BNP (last 3 results) No results for input(s): PROBNP in the last 8760 hours. HbA1C: Recent Labs    12/08/19 2000  HGBA1C 8.4*   CBG: Recent Labs  Lab 12/09/19 0930 12/09/19 1052 12/09/19 1118 12/09/19 1253 12/09/19 1559  GLUCAP 73 61* 67* 76 133*   Lipid Profile: No results for input(s): CHOL, HDL, LDLCALC, TRIG, CHOLHDL, LDLDIRECT in the last 72 hours. Thyroid Function Tests: Recent Labs    12/09/19 0150  TSH 1.102   Anemia Panel: Recent Labs    12/09/19 0149 12/09/19 0150  VITAMINB12 104*  --   FOLATE  --  12.4  TIBC 158*  --   IRON 30*  --    Sepsis Labs: Recent Labs  Lab 12/08/19 2014 12/09/19 1200  PROCALCITON  --  23.81  LATICACIDVEN 1.4  --     Recent Results (from the past 240 hour(s))  Blood Culture (routine x 2)      Status: None (Preliminary result)   Collection Time: 12/08/19  8:14 PM   Specimen: BLOOD LEFT FOREARM  Result Value Ref Range Status   Specimen Description BLOOD LEFT FOREARM  Final   Special Requests   Final    BOTTLES DRAWN AEROBIC ONLY Blood Culture results may not be optimal due to an inadequate volume of blood received in culture bottles Performed at [facility], [place] [address] [place], [place] [address]    Culture   Final    NO GROWTH < 24 HOURS Performed at [place] [address], [place], [place] [address]    Report Status PENDING  Incomplete  Blood Culture (routine x 2)     Status: None (Preliminary result)   Collection Time: 12/08/19  8:45 PM   Specimen: BLOOD  Result Value Ref Range Status   Specimen Description   Final    BLOOD RIGHT HAND Performed at [place] [address], [place], [place] [address]    Special Requests   Final  BOTTLES DRAWN AEROBIC AND ANAEROBIC Blood Culture results may not be optimal due to an excessive volume of blood received in culture bottles Performed at [facility], [place] [address], [place], [place] [address]    Culture   Final    NO GROWTH < 24 HOURS Performed at [place] [address] [place], [place] [address]    Report Status PENDING  Incomplete  Respiratory Panel by RT PCR (Flu A&B, Covid) - Nasopharyngeal Swab     Status: None   Collection Time: 12/08/19 10:00 PM   Specimen: Nasopharyngeal Swab  Result Value Ref Range Status   SARS Coronavirus 2 by RT PCR NEGATIVE NEGATIVE Final    Comment: (NOTE) SARS-CoV-2 target nucleic acids are NOT DETECTED. The SARS-CoV-2 RNA is generally detectable in upper respiratoy specimens during the acute phase of infection. The lowest concentration of SARS-CoV-2 viral copies this assay can detect is 131 copies/mL. A negative result does not preclude SARS-Cov-2 infection and should not be used as the sole  basis for treatment or other patient management decisions. A negative result may occur with  improper specimen collection/handling, submission of specimen other than nasopharyngeal swab, presence of viral mutation(s) within the areas targeted by this assay, and inadequate number of viral copies (<131 copies/mL). A negative result must be combined with clinical observations, patient history, and epidemiological information. The expected result is Negative. Fact Sheet for Patients:  PinkCheek.be Fact Sheet for Healthcare Providers:  GravelBags.it This test is not yet ap proved or cleared by the [place] FDA and  has been authorized for detection and/or diagnosis of SARS-CoV-2 by FDA under an Emergency Use Authorization (EUA). This EUA will remain  in effect (meaning this test can be used) for the duration of the COVID-19 declaration under Section 564(b)(1) of the Act, 21 U.S.C. section 360bbb-3(b)(1), unless the authorization is terminated or revoked sooner.    Influenza A by PCR NEGATIVE NEGATIVE Final   Influenza B by PCR NEGATIVE NEGATIVE Final    Comment: (NOTE) The Xpert Xpress SARS-CoV-2/FLU/RSV assay is intended as an aid in  the diagnosis of influenza from Nasopharyngeal swab specimens and  should not be used as a sole basis for treatment. Nasal washings and  aspirates are unacceptable for Xpert Xpress SARS-CoV-2/FLU/RSV  testing. Fact Sheet for Patients: PinkCheek.be Fact Sheet for Healthcare Providers: GravelBags.it This test is not yet approved or cleared by the [place] FDA and  has been authorized for detection and/or diagnosis of SARS-CoV-2 by  FDA under an Emergency Use Authorization (EUA). This EUA will remain  in effect (meaning this test can be used) for the duration of the  Covid-19 declaration under Section 564(b)(1) of the Act, 21  U.S.C.  section 360bbb-3(b)(1), unless the authorization is  terminated or revoked. Performed at [facility], [place] [address] [place], [place] [address]          Radiology Studies: CT ABDOMEN PELVIS WO CONTRAST  Result Date: 12/09/2019 CLINICAL DATA:  Sepsis, abdominal pain tenderness severe acute kidney injury EXAM: CT ABDOMEN AND PELVIS WITHOUT CONTRAST TECHNIQUE: Multidetector CT imaging of the abdomen and pelvis was performed following the standard protocol without IV contrast. COMPARISON:  MRI abdomen August 18, 2019 FINDINGS: Lower chest: There is moderate cardiomegaly. Trace pericardial effusion/thickening is seen. Streaky atelectasis seen at both lung bases. There is a small hiatal hernia. Hepatobiliary: The liver is normal in density without focal abnormality.The main portal vein is patent. No evidence of calcified gallstones,  gallbladder wall thickening or biliary dilatation. Pancreas: Unremarkable. No pancreatic ductal dilatation or surrounding inflammatory changes. Spleen: Normal in size without focal abnormality. Adrenals/Urinary Tract: Both adrenal glands appear normal. Again noted are several partially exophytic lesions within both kidneys. The largest which is slightly hyperdense within the upper pole of the right kidney measuring 3.8 cm most recently found to be a renal cyst. There scattered bilateral renal calculi present the largest measuring 6 mm in the lower pole of the left kidney. There is mild to moderate right-sided perinephric stranding, predominantly around the lower pole. No hydronephrosis however is seen. No ureteral or bladder calculi are noted. Stomach/Bowel: The stomach, small bowel, and colon are normal in appearance. No inflammatory changes, wall thickening, or obstructive findings.Scattered colonic diverticula are noted without diverticulitis. Vascular/Lymphatic: There are no enlarged mesenteric, retroperitoneal, or pelvic lymph nodes. Scattered aortic  atherosclerotic calcifications are seen without aneurysmal dilatation. Reproductive: The prostate is unremarkable. Other: Bilateral fat containing inguinal hernias are present. Musculoskeletal: No acute or significant osseous findings. IMPRESSION: Mild-to-moderate right-sided perinephric fat stranding changes which could be due to infectious /inflammatory process or recently passed stone. Bilateral nonobstructing renal calculi. Diverticulosis without diverticulitis. Aortic Atherosclerosis (ICD10-I70.0). Electronically Signed   By: Prudencio Pair M.D.   On: 12/09/2019 03:34   CT HEAD WO CONTRAST  Result Date: 12/09/2019 CLINICAL DATA:  Altered mental status. EXAM: CT HEAD WITHOUT CONTRAST TECHNIQUE: Contiguous axial images were obtained from the base of the skull through the vertex without intravenous contrast. COMPARISON:  May 17, 2019 FINDINGS: Brain: No evidence of acute infarction, hemorrhage, hydrocephalus, extra-axial collection or mass lesion/mass effect. Vascular: No hyperdense vessel or unexpected calcification. Skull: Normal. Negative for fracture or focal lesion. Sinuses/Orbits: There is stable marked severity left maxillary sinus, left ethmoid sinus and sphenoid sinus mucosal thickening. Stable hyperdense secretions are again seen. A chronic deformity of the medial wall of the left maxillary sinus is also seen. Other: None. IMPRESSION: 1. No acute intracranial abnormality. 2. Stable left maxillary sinus, left ethmoid sinus and sphenoid sinus disease. Electronically Signed   By: Virgina Norfolk M.D.   On: 12/09/2019 03:16   DG Chest Port 1 View  Result Date: 12/08/2019 CLINICAL DATA:  Altered mental status, minimally responsive since Sunday. EXAM: PORTABLE CHEST 1 VIEW COMPARISON:  September 05, 2018 FINDINGS: There is no evidence of acute infiltrate, pleural effusion or pneumothorax. The heart size and mediastinal contours are within normal limits. Mild to moderate severity degenerative changes  are again seen within the mid to lower thoracic spine. IMPRESSION: No active disease. Electronically Signed   By: Virgina Norfolk M.D.   On: 12/08/2019 21:22        Scheduled Meds: . aspirin  81 mg Oral QHS  . atorvastatin  40 mg Oral Daily  . heparin  5,000 Units Subcutaneous Q8H  . hydrocortisone sod succinate (SOLU-CORTEF) inj  50 mg Intravenous Q6H  . insulin aspart  0-20 Units Subcutaneous Q4H  . pantoprazole (PROTONIX) IV  40 mg Intravenous Q24H  . vancomycin variable dose per unstable renal function (pharmacist dosing)   Does not apply See admin instructions   Continuous Infusions: . ceFEPime (MAXIPIME) IV Stopped (12/09/19 1127)  . dextrose 5 % and 0.45% NaCl 125 mL/hr at 12/09/19 1523  . metronidazole 100 mL/hr at 12/09/19 1523     LOS: 0 days     Cordelia Poche, MD [facility] 12/09/2019, 5:31 PM  If 7PM-7AM, please contact night-coverage [email or web address]

## 2019-12-09 NOTE — ED Notes (Signed)
Lab called need light green re collect

## 2019-12-10 ENCOUNTER — Inpatient Hospital Stay (HOSPITAL_COMMUNITY): Payer: Medicaid Other

## 2019-12-10 DIAGNOSIS — R6520 Severe sepsis without septic shock: Secondary | ICD-10-CM

## 2019-12-10 DIAGNOSIS — A419 Sepsis, unspecified organism: Secondary | ICD-10-CM | POA: Diagnosis present

## 2019-12-10 LAB — URINE CULTURE: Culture: >=100000 — AB

## 2019-12-10 LAB — CBC WITH DIFFERENTIAL/PLATELET
Abs Immature Granulocytes: 0.15 10*3/uL — ABNORMAL HIGH (ref 0.00–0.07)
Basophils Absolute: 0 10*3/uL (ref 0.0–0.1)
Basophils Relative: 0 %
Eosinophils Absolute: 0 10*3/uL (ref 0.0–0.5)
Eosinophils Relative: 0 %
HCT: 34.5 % — ABNORMAL LOW (ref 39.0–52.0)
Hemoglobin: 10.6 g/dL — ABNORMAL LOW (ref 13.0–17.0)
Immature Granulocytes: 1 %
Lymphocytes Relative: 3 %
Lymphs Abs: 0.6 10*3/uL — ABNORMAL LOW (ref 0.7–4.0)
MCH: 29 pg (ref 26.0–34.0)
MCHC: 30.7 g/dL (ref 30.0–36.0)
MCV: 94.3 fL (ref 80.0–100.0)
Monocytes Absolute: 1.2 10*3/uL — ABNORMAL HIGH (ref 0.1–1.0)
Monocytes Relative: 6 %
Neutro Abs: 18.5 10*3/uL — ABNORMAL HIGH (ref 1.7–7.7)
Neutrophils Relative %: 90 %
Platelets: 259 10*3/uL (ref 150–400)
RBC: 3.66 MIL/uL — ABNORMAL LOW (ref 4.22–5.81)
RDW: 15.7 % — ABNORMAL HIGH (ref 11.5–15.5)
WBC: 20.4 10*3/uL — ABNORMAL HIGH (ref 4.0–10.5)
nRBC: 0 % (ref 0.0–0.2)

## 2019-12-10 LAB — COMPREHENSIVE METABOLIC PANEL
ALT: 26 U/L (ref 0–44)
AST: 31 U/L (ref 15–41)
Albumin: 2.2 g/dL — ABNORMAL LOW (ref 3.5–5.0)
Alkaline Phosphatase: 49 U/L (ref 38–126)
Anion gap: 8 (ref 5–15)
BUN: 65 mg/dL — ABNORMAL HIGH (ref 8–23)
CO2: 20 mmol/L — ABNORMAL LOW (ref 22–32)
Calcium: 7.9 mg/dL — ABNORMAL LOW (ref 8.9–10.3)
Chloride: 111 mmol/L (ref 98–111)
Creatinine, Ser: 3.57 mg/dL — ABNORMAL HIGH (ref 0.61–1.24)
GFR calc Af Amer: 20 mL/min — ABNORMAL LOW (ref >60)
GFR calc non Af Amer: 17 mL/min — ABNORMAL LOW (ref >60)
Glucose, Bld: 249 mg/dL — ABNORMAL HIGH (ref 70–99)
Potassium: 4.9 mmol/L (ref 3.5–5.1)
Sodium: 139 mmol/L (ref 135–145)
Total Bilirubin: 0.9 mg/dL (ref 0.3–1.2)
Total Protein: 6.7 g/dL (ref 6.5–8.1)

## 2019-12-10 LAB — MAGNESIUM: Magnesium: 2.5 mg/dL — ABNORMAL HIGH (ref 1.7–2.4)

## 2019-12-10 LAB — CBG MONITORING, ED
Glucose-Capillary: 254 mg/dL — ABNORMAL HIGH (ref 70–99)
Glucose-Capillary: 228 mg/dL — ABNORMAL HIGH (ref 70–99)
Glucose-Capillary: 222 mg/dL — ABNORMAL HIGH (ref 70–99)
Glucose-Capillary: 193 mg/dL — ABNORMAL HIGH (ref 70–99)
Glucose-Capillary: 228 mg/dL — ABNORMAL HIGH (ref 70–99)

## 2019-12-10 LAB — VANCOMYCIN, RANDOM: Vancomycin Rm: 16

## 2019-12-10 LAB — PROCALCITONIN: Procalcitonin: 11.85 ng/mL

## 2019-12-10 MED ORDER — PREDNISONE 5 MG PO TABS
5.0000 mg | ORAL_TABLET | Freq: Every day | ORAL | Status: DC
  Administered 2019-12-11 – 2019-12-13 (×3): 5 mg via ORAL
  Filled 2019-12-10 (×3): qty 1

## 2019-12-10 MED ORDER — SODIUM CHLORIDE 0.9 % IV SOLN: INTRAVENOUS | Status: DC

## 2019-12-10 MED ORDER — INSULIN ASPART 100 UNIT/ML ~~LOC~~ SOLN
0.0000 [IU] | Freq: Three times a day (TID) | SUBCUTANEOUS | Status: DC
  Administered 2019-12-11: 3 [IU] via SUBCUTANEOUS
  Administered 2019-12-11: 4 [IU] via SUBCUTANEOUS
  Administered 2019-12-12: 3 [IU] via SUBCUTANEOUS
  Administered 2019-12-12 (×2): 7 [IU] via SUBCUTANEOUS
  Administered 2019-12-13: 11 [IU] via SUBCUTANEOUS
  Filled 2019-12-10: qty 0.2

## 2019-12-10 MED ORDER — VANCOMYCIN HCL IN DEXTROSE 1-5 GM/200ML-% IV SOLN
1000.0000 mg | Freq: Once | INTRAVENOUS | Status: DC
  Filled 2019-12-10: qty 200

## 2019-12-10 NOTE — Progress Notes (Signed)
Pharmacy Antibiotic Note  Marco [name] is a 64 y.o. male with hx CKD presented to the ED on 12/08/2019 with AMS and hypotension.  He was started on broad abx on admission for sepsis.  Today, 12/10/2019: - day #2 vancomycin, cefepime and flagyl - wbc up 20.4 (on steroid), scr down 3.57 (crcl~24)--> baseline scr ~1.5 - PCT down 11.85 - random Vancomycin trough level this morning at 0500 = 16 (vanc 1gm given on 4/29 at 2200 and 1gm on 4/30 at 0324)   Plan: - vancomycin 1000 mg IV x1 now - check random vancomycin level in 1-2 days depending on renal function and order subsequent dose if/when appropriate - continue cefepime 2gm IV q24h - Flagyl 500 mg IV q8h - monitor renal function closely - f/u cultures ______________________________________________  Height: 5\' 6"  (167.6 cm) Weight: 108.9 kg (240 lb) IBW/kg (Calculated) : 63.8  No data recorded.  Recent Labs  Lab 12/08/19 2014 12/09/19 0210 12/09/19 1756 12/10/19 0500  WBC 17.9*  --   --  20.4*  CREATININE 5.61* 5.22* 4.10* 3.57*  LATICACIDVEN 1.4  --   --   --   VANCORANDOM  --   --   --  16    Estimated Creatinine Clearance: 24.2 mL/min (A) (by C-G formula based on SCr of 3.57 mg/dL (H)).    Allergies  Allergen Reactions  . Nsaids Other (See Comments)    Stomach pains. Ulcers - stated by patient     Antimicrobials this admission: 4/29 zosyn >> x1 ED 4/29 vancomcyin >>  4/30 cefepime >> 4/30 flagyl >>  Dose adjustments this admission: 5/1 0500 VR = 16  Microbiology results: 4/29 BCx x2:  4/29 UCx: >100 K Ecoli  4/29 COVID-, Flu-  Thank you for allowing pharmacy to be a part of this patient's care.  Lynelle Doctor 12/10/2019 7:28 AM

## 2019-12-10 NOTE — ED Notes (Signed)
ED TO INPATIENT HANDOFF REPORT  ED Nurse Name and Phone #: [phone]  S Name/Age/Gender [name] 64 y.o. male Room/Bed: WA17/WA17  Code Status   Code Status: Full Code  Home/SNF/Other Home Patient oriented to: self, place, time and situation Is this baseline? Yes   Triage Complete: Triage complete  Chief Complaint Sepsis due to Gram negative bacteria ([place]) [A41.50] Severe sepsis ([place]) [A41.9, R65.20]  Triage Note Per ems, spouse notes patient has been minimally responsive since Sunday. Has still been giving insulin and opiates though patient has been unable to eat. Incontinent of bowel and bladder. On arrival CBG - 60, patient only responsive to pain. Patient responsive on arrival to ED to moderate stimuli. Lethargic. Got 600 mL NS and 15 G d10. bp now 85/60.     Allergies Allergies  Allergen Reactions  . Nsaids Other (See Comments)    Stomach pains. Ulcers - stated by patient     Level of Care/Admitting Diagnosis ED Disposition    ED Disposition Condition Comment   Admit  Hospital Area: [place] [[id number]]  Level of Care: Med-Surg [16]  Covid Evaluation: Asymptomatic Screening Protocol (No Symptoms)  [facility]: [id number]  Admitting Physician: Mariel Aloe [phone]  Attending Physician: Mariel Aloe [phone]  Estimated length of stay: past midnight tomorrow  Certification:: I certify this patient will need inpatient services for at least 2 midnights       B Medical/Surgery History Past Medical History:  Diagnosis Date  . Benign colon polyp 08/01/2013   [facility] in [place]. large base tranverse colon polyp was biopsied. polyp was benign with minimal surface hyperplastic change.  . Chronic combined systolic and diastolic CHF (congestive heart failure) ([place])   . Chronic pain   . CKD (chronic kidney disease), stage III   . Diabetes mellitus without complication ([place])   . Diverticulosis   .  Esophageal hiatal hernia 07/29/2013   confirmed on EGD   . Esophageal stricture   . Essential hypertension   . Gastritis 07/29/2013   confirmed on EGD, bx done an negative for intestinal metaplasia, dsyplasia or H. pylori. normal gastric emptying study done 07/13/2013.  [name] GERD (gastroesophageal reflux disease)   . Morbid obesity ([place])   . Non-obstructive CAD    a. 02/2013 Cath ([place]): nonobs dzs;  b. 09/2014 Myoview ([place]): EF 55%, no ischemia;  c. Cath 05/2018 mild CAD no obstruction.  . Persistent atrial fibrillation ([place])    a. 02/2013 s/p rfca in [place], [place]-->prev on Xarelto, d/c'd 2/2 anemia, ? GIB.  [name] Rheumatoid arthritis ([place])   . Sigmoid diverticulosis 08/01/2013   confirmed on colonscopy. record scanned into chart   Past Surgical History:  Procedure Laterality Date  . CARDIAC CATHETERIZATION  05/2014   ablation for atrial fibrillation  . CARDIAC CATHETERIZATION N/A 11/16/2015   Procedure: Left Heart Cath and Coronary Angiography;  Surgeon: Leonie Man, MD;  Location: [facility] [facility];  Service: Cardiovascular;  Laterality: N/A;  . COLON RESECTION  09/2013   due to large, abnormal polpy. non cancerous per patient.   . COLON SURGERY  09/2014   colon resection   . LEFT HEART CATH AND CORONARY ANGIOGRAPHY N/A 05/19/2018   Procedure: LEFT HEART CATH AND CORONARY ANGIOGRAPHY;  Surgeon: Wellington Hampshire, MD;  Location: [facility];  Service: Cardiovascular;  Laterality: N/A;     A IV Location/Drains/Wounds Patient Lines/Drains/Airways Status   Active Line/Drains/Airways    Name:  Placement date:   Placement time:   Site:   Days:   Peripheral IV 12/08/19 Anterior;Distal;Upper;Left Antecubital   12/08/19    2015    Antecubital   2   Peripheral IV 12/08/19 Right Antecubital   12/08/19    2032    Antecubital   2   External Urinary Catheter   12/08/19    2031    --   2          Intake/Output Last 24 hours  Intake/Output Summary (Last 24 hours) at 12/10/2019 1800 Last  data filed at 12/10/2019 1227 Gross per 24 hour  Intake --  Output 800 ml  Net -800 ml    Labs/Imaging Results for orders placed or performed during the hospital encounter of 12/08/19 (from the past 48 hour(s))  Hemoglobin A1c     Status: Abnormal   Collection Time: 12/08/19  8:00 PM  Result Value Ref Range   Hgb A1c MFr Bld 8.4 (H) 4.8 - 5.6 %    Comment: (NOTE) Pre diabetes:          5.7%-6.4% Diabetes:              >6.4% Glycemic control for   <7.0% adults with diabetes    Mean Plasma Glucose 194.38 mg/dL    Comment: Performed at [facility], [place] [address], [place], [place] [address]  Lactic acid, plasma     Status: None   Collection Time: 12/08/19  8:14 PM  Result Value Ref Range   Lactic Acid, Venous 1.4 0.5 - 1.9 mmol/L    Comment: Performed at [facility], [place] [address], [place], [place] [address]  Comprehensive metabolic panel     Status: Abnormal   Collection Time: 12/08/19  8:14 PM  Result Value Ref Range   Sodium 142 135 - 145 mmol/L   Potassium 5.8 (H) 3.5 - 5.1 mmol/L   Chloride 108 98 - 111 mmol/L   CO2 22 22 - 32 mmol/L   Glucose, Bld 111 (H) 70 - 99 mg/dL    Comment: Glucose reference range applies only to samples taken after fasting for at least 8 hours.   BUN 73 (H) 8 - 23 mg/dL   Creatinine, Ser 5.61 (H) 0.61 - 1.24 mg/dL   Calcium 8.2 (L) 8.9 - 10.3 mg/dL   Total Protein 7.5 6.5 - 8.1 g/dL   Albumin 2.7 (L) 3.5 - 5.0 g/dL   AST 24 15 - 41 U/L   ALT 20 0 - 44 U/L   Alkaline Phosphatase 45 38 - 126 U/L   Total Bilirubin 0.8 0.3 - 1.2 mg/dL   GFR calc non Af Amer 10 (L) >60 mL/min   GFR calc Af Amer 11 (L) >60 mL/min   Anion gap 12 5 - 15    Comment: Performed at [facility], [place] [address] [place], [place] [address]  CBC WITH DIFFERENTIAL     Status: Abnormal   Collection Time: 12/08/19  8:14 PM  Result Value Ref Range   WBC 17.9 (H) 4.0 - 10.5 K/uL   RBC 3.65 (L) 4.22 - 5.81 MIL/uL   Hemoglobin  10.8 (L) 13.0 - 17.0 g/dL   HCT 34.6 (L) 39.0 - 52.0 %   MCV 94.8 80.0 - 100.0 fL   MCH 29.6 26.0 - 34.0 pg   MCHC 31.2 30.0 - 36.0 g/dL   RDW 15.6 (H) 11.5 - 15.5 %   Platelets 270 150 - 400 K/uL  nRBC 0.0 0.0 - 0.2 %   Neutrophils Relative % 81 %   Neutro Abs 14.6 (H) 1.7 - 7.7 K/uL   Lymphocytes Relative 7 %   Lymphs Abs 1.2 0.7 - 4.0 K/uL   Monocytes Relative 11 %   Monocytes Absolute 1.9 (H) 0.1 - 1.0 K/uL   Eosinophils Relative 0 %   Eosinophils Absolute 0.0 0.0 - 0.5 K/uL   Basophils Relative 0 %   Basophils Absolute 0.0 0.0 - 0.1 K/uL   Immature Granulocytes 1 %   Abs Immature Granulocytes 0.12 (H) 0.00 - 0.07 K/uL    Comment: Performed at [facility], [place] [address] [place], [place] [address]  APTT     Status: None   Collection Time: 12/08/19  8:14 PM  Result Value Ref Range   aPTT 33 24 - 36 seconds    Comment: Performed at [facility], [place] [address], [place], [place] [address]  Protime-INR     Status: Abnormal   Collection Time: 12/08/19  8:14 PM  Result Value Ref Range   Prothrombin Time 15.5 (H) 11.4 - 15.2 seconds   INR 1.3 (H) 0.8 - 1.2    Comment: (NOTE) INR goal varies based on device and disease states. Performed at [facility], [place] [address], [place], [place] [address]   Blood Culture (routine x 2)     Status: None (Preliminary result)   Collection Time: 12/08/19  8:14 PM   Specimen: BLOOD LEFT FOREARM  Result Value Ref Range   Specimen Description BLOOD LEFT FOREARM    Special Requests      BOTTLES DRAWN AEROBIC ONLY Blood Culture results may not be optimal due to an inadequate volume of blood received in culture bottles Performed at [facility], [place] [address], [place], [place] [address]    Culture      NO GROWTH 2 DAYS Performed at [facility], [place] [address], [place], [place] [address]    Report Status PENDING   Urinalysis, Routine w reflex microscopic      Status: Abnormal   Collection Time: 12/08/19  8:14 PM  Result Value Ref Range   Color, Urine AMBER (A) YELLOW    Comment: BIOCHEMICALS MAY BE AFFECTED BY COLOR   APPearance CLOUDY (A) CLEAR   Specific Gravity, Urine 1.016 1.005 - 1.030   pH 5.0 5.0 - 8.0   Glucose, UA NEGATIVE NEGATIVE mg/dL   Hgb urine dipstick NEGATIVE NEGATIVE   Bilirubin Urine NEGATIVE NEGATIVE   Ketones, ur NEGATIVE NEGATIVE mg/dL   Protein, ur 100 (A) NEGATIVE mg/dL   Nitrite NEGATIVE NEGATIVE   Leukocytes,Ua LARGE (A) NEGATIVE   RBC / HPF 11-20 0 - 5 RBC/hpf   WBC, UA >50 (H) 0 - 5 WBC/hpf   Bacteria, UA MANY (A) NONE SEEN   Squamous Epithelial / LPF 11-20 0 - 5   Hyaline Casts, UA PRESENT     Comment: Performed at [facility], [place] [address], [place], [place] [address]  Urine culture     Status: Abnormal   Collection Time: 12/08/19  8:14 PM   Specimen: In/Out Cath Urine  Result Value Ref Range   Specimen Description      IN/OUT CATH URINE Performed at [facility], [place] [address] [place], [place] [address]    Special Requests      NONE Performed at [facility], [place] [address] [place], [place] [address]  Culture >=100,000 COLONIES/mL ESCHERICHIA COLI (A)    Report Status 12/10/2019 FINAL    Organism ID, Bacteria ESCHERICHIA COLI (A)       Susceptibility   Escherichia coli - MIC*    AMPICILLIN >=32 RESISTANT Resistant     CEFAZOLIN >=64 RESISTANT Resistant     CEFTRIAXONE <=1 SENSITIVE Sensitive     CIPROFLOXACIN <=0.25 SENSITIVE Sensitive     GENTAMICIN <=1 SENSITIVE Sensitive     IMIPENEM <=0.25 SENSITIVE Sensitive     NITROFURANTOIN <=16 SENSITIVE Sensitive     TRIMETH/SULFA <=20 SENSITIVE Sensitive     AMPICILLIN/SULBACTAM >=32 RESISTANT Resistant     PIP/TAZO 8 SENSITIVE Sensitive     * >=100,000 COLONIES/mL ESCHERICHIA COLI  Blood Culture (routine x 2)     Status: None (Preliminary result)   Collection Time: 12/08/19  8:45 PM    Specimen: BLOOD  Result Value Ref Range   Specimen Description      BLOOD RIGHT HAND Performed at [place] [address] [place], [place] [address]    Special Requests      BOTTLES DRAWN AEROBIC AND ANAEROBIC Blood Culture results may not be optimal due to an excessive volume of blood received in culture bottles Performed at [place] [address], [place], [place] [address]    Culture      NO GROWTH 2 DAYS Performed at [facility], [place] [address] [place], [place] [address]    Report Status PENDING   Respiratory Panel by RT PCR (Flu A&B, Covid) - Nasopharyngeal Swab     Status: None   Collection Time: 12/08/19 10:00 PM   Specimen: Nasopharyngeal Swab  Result Value Ref Range   SARS Coronavirus 2 by RT PCR NEGATIVE NEGATIVE    Comment: (NOTE) SARS-CoV-2 target nucleic acids are NOT DETECTED. The SARS-CoV-2 RNA is generally detectable in upper respiratoy specimens during the acute phase of infection. The lowest concentration of SARS-CoV-2 viral copies this assay can detect is 131 copies/mL. A negative result does not preclude SARS-Cov-2 infection and should not be used as the sole basis for treatment or other patient management decisions. A negative result may occur with  improper specimen collection/handling, submission of specimen other than nasopharyngeal swab, presence of viral mutation(s) within the areas targeted by this assay, and inadequate number of viral copies (<131 copies/mL). A negative result must be combined with clinical observations, patient history, and epidemiological information. The expected result is Negative. Fact Sheet for Patients:  PinkCheek.be Fact Sheet for Healthcare Providers:  GravelBags.it This test is not yet ap proved or cleared by the [place] FDA and  has been authorized for detection and/or diagnosis of SARS-CoV-2 by FDA  under an Emergency Use Authorization (EUA). This EUA will remain  in effect (meaning this test can be used) for the duration of the COVID-19 declaration under Section 564(b)(1) of the Act, 21 U.S.C. section 360bbb-3(b)(1), unless the authorization is terminated or revoked sooner.    Influenza A by PCR NEGATIVE NEGATIVE   Influenza B by PCR NEGATIVE NEGATIVE    Comment: (NOTE) The Xpert Xpress SARS-CoV-2/FLU/RSV assay is intended as an aid in  the diagnosis of influenza from Nasopharyngeal swab specimens and  should not be used as a sole basis for treatment. Nasal washings and  aspirates are unacceptable for Xpert Xpress SARS-CoV-2/FLU/RSV  testing. Fact Sheet for Patients: PinkCheek.be Fact Sheet for Healthcare Providers: GravelBags.it This test is not yet approved or cleared by the [place]  FDA and  has been authorized for detection and/or diagnosis of SARS-CoV-2 by  FDA under an Emergency Use Authorization (EUA). This EUA will remain  in effect (meaning this test can be used) for the duration of the  Covid-19 declaration under Section 564(b)(1) of the Act, 21  U.S.C. section 360bbb-3(b)(1), unless the authorization is  terminated or revoked. Performed at [facility], [place] [address], [place], [place] [address]   Blood gas, arterial     Status: Abnormal   Collection Time: 12/08/19 10:43 PM  Result Value Ref Range   FIO2 21.00    pH, Arterial 7.340 (L) 7.350 - 7.450   pCO2 arterial 42.0 32.0 - 48.0 mmHg   pO2, Arterial 66.1 (L) 83.0 - 108.0 mmHg   Bicarbonate 22.2 20.0 - 28.0 mmol/L   Acid-base deficit 3.0 (H) 0.0 - 2.0 mmol/L   O2 Saturation 90.9 %   Patient temperature 97.6    Allens test (pass/fail) PASS PASS    Comment: Performed at [facility], [place] [address], [place], [place] [address]  Vitamin B12     Status: Abnormal   Collection Time: 12/09/19  1:49 AM  Result Value  Ref Range   Vitamin B-12 104 (L) 180 - 914 pg/mL    Comment: (NOTE) This assay is not validated for testing neonatal or myeloproliferative syndrome specimens for Vitamin B12 levels. Performed at [facility], [place] [address], [place], [place] [address]   Iron and TIBC     Status: Abnormal   Collection Time: 12/09/19  1:49 AM  Result Value Ref Range   Iron 30 (L) 45 - 182 ug/dL   TIBC 158 (L) 250 - 450 ug/dL   Saturation Ratios 19 17.9 - 39.5 %   UIBC 128 ug/dL    Comment: Performed at [facility], [place] [address] [place], [place] [address]  Folate     Status: None   Collection Time: 12/09/19  1:50 AM  Result Value Ref Range   Folate 12.4 >5.9 ng/mL    Comment: Performed at [name], [place] [address], [place], [place] [address]  TSH     Status: None   Collection Time: 12/09/19  1:50 AM  Result Value Ref Range   TSH 1.102 0.350 - 4.500 uIU/mL    Comment: Performed by a 3rd Generation assay with a functional sensitivity of <=0.01 uIU/mL. Performed at [facility], [place] [address], [place], [place] [address]   Urine rapid drug screen (hosp performed)     Status: Abnormal   Collection Time: 12/09/19  1:50 AM  Result Value Ref Range   Opiates POSITIVE (A) NONE DETECTED   Cocaine NONE DETECTED NONE DETECTED   Benzodiazepines NONE DETECTED NONE DETECTED   Amphetamines NONE DETECTED NONE DETECTED   Tetrahydrocannabinol NONE DETECTED NONE DETECTED   Barbiturates NONE DETECTED NONE DETECTED    Comment: (NOTE) DRUG SCREEN FOR MEDICAL PURPOSES ONLY.  IF CONFIRMATION IS NEEDED FOR ANY PURPOSE, NOTIFY LAB WITHIN 5 DAYS. LOWEST DETECTABLE LIMITS FOR URINE DRUG SCREEN Drug Class                     Cutoff (ng/mL) Amphetamine and metabolites    1000 Barbiturate and metabolites    200 Benzodiazepine                 [id number] Tricyclics and metabolites     300 Opiates and metabolites  300 Cocaine and  metabolites        300 THC                            50 Performed at [facility], [place] [address] [place], [place] [address]   RPR     Status: None   Collection Time: 12/09/19  2:10 AM  Result Value Ref Range   RPR Ser Ql NON REACTIVE NON REACTIVE    Comment: Performed at [facility], [place] [address] [place], [place] [address]  HIV Antibody (routine testing w rflx)     Status: None   Collection Time: 12/09/19  2:10 AM  Result Value Ref Range   HIV Screen 4th Generation wRfx Non Reactive Non Reactive    Comment: Performed at [facility], [place] [address] [place], [place] [address]  Renal function panel     Status: Abnormal   Collection Time: 12/09/19  2:10 AM  Result Value Ref Range   Sodium 141 135 - 145 mmol/L   Potassium 4.8 3.5 - 5.1 mmol/L    Comment: DELTA CHECK NOTED   Chloride 109 98 - 111 mmol/L   CO2 20 (L) 22 - 32 mmol/L   Glucose, Bld 55 (L) 70 - 99 mg/dL    Comment: Glucose reference range applies only to samples taken after fasting for at least 8 hours.   BUN 68 (H) 8 - 23 mg/dL   Creatinine, Ser 5.22 (H) 0.61 - 1.24 mg/dL   Calcium 7.7 (L) 8.9 - 10.3 mg/dL   Phosphorus 5.2 (H) 2.5 - 4.6 mg/dL   Albumin 2.3 (L) 3.5 - 5.0 g/dL   GFR calc non Af Amer 11 (L) >60 mL/min   GFR calc Af Amer 12 (L) >60 mL/min   Anion gap 12 5 - 15    Comment: Performed at [facility], [place] [address] [place], [place] [address]  Ammonia     Status: None   Collection Time: 12/09/19  2:10 AM  Result Value Ref Range   Ammonia 26 9 - 35 umol/L    Comment: Performed at [facility], [place] [address], [place], [place] [address]  CBG monitoring, ED     Status: Abnormal   Collection Time: 12/09/19  3:23 AM  Result Value Ref Range   Glucose-Capillary 42 (LL) 70 - 99 mg/dL    Comment: Glucose reference range applies only to samples taken after fasting for at least 8 hours.   Comment 1 Notify RN   CBG monitoring, ED      Status: Abnormal   Collection Time: 12/09/19  4:27 AM  Result Value Ref Range   Glucose-Capillary 157 (H) 70 - 99 mg/dL    Comment: Glucose reference range applies only to samples taken after fasting for at least 8 hours.  CBG monitoring, ED     Status: Abnormal   Collection Time: 12/09/19  5:09 AM  Result Value Ref Range   Glucose-Capillary 128 (H) 70 - 99 mg/dL    Comment: Glucose reference range applies only to samples taken after fasting for at least 8 hours.  CBG monitoring, ED     Status: Abnormal   Collection Time: 12/09/19  6:09 AM  Result Value Ref Range   Glucose-Capillary 110 (H) 70 - 99 mg/dL    Comment: Glucose reference range applies only to samples taken after fasting for at least 8 hours.  CBG monitoring, ED     Status: None   Collection Time: 12/09/19  8:05 AM  Result Value Ref Range   Glucose-Capillary 78 70 - 99 mg/dL    Comment: Glucose reference range applies only to samples taken after fasting for at least 8 hours.  CBG monitoring, ED     Status: None   Collection Time: 12/09/19  9:30 AM  Result Value Ref Range   Glucose-Capillary 73 70 - 99 mg/dL    Comment: Glucose reference range applies only to samples taken after fasting for at least 8 hours.  CBG monitoring, ED     Status: Abnormal   Collection Time: 12/09/19 10:52 AM  Result Value Ref Range   Glucose-Capillary 61 (L) 70 - 99 mg/dL    Comment: Glucose reference range applies only to samples taken after fasting for at least 8 hours.  CBG monitoring, ED     Status: Abnormal   Collection Time: 12/09/19 11:18 AM  Result Value Ref Range   Glucose-Capillary 67 (L) 70 - 99 mg/dL    Comment: Glucose reference range applies only to samples taken after fasting for at least 8 hours.  Troponin I (High Sensitivity)     Status: Abnormal   Collection Time: 12/09/19 12:00 PM  Result Value Ref Range   Troponin I (High Sensitivity) 32 (H) <18 ng/L    Comment: (NOTE) Elevated high sensitivity troponin I (hsTnI) values  and significant  changes across serial measurements may suggest ACS but many other  chronic and acute conditions are known to elevate hsTnI results.  Refer to the "Links" section for chest pain algorithms and additional  guidance. Performed at [facility], [place] [address], [place], [place] [address]   Procalcitonin     Status: None   Collection Time: 12/09/19 12:00 PM  Result Value Ref Range   Procalcitonin 23.81 ng/mL    Comment:        Interpretation: PCT >= 10 ng/mL: Important systemic inflammatory response, almost exclusively due to severe bacterial sepsis or septic shock. (NOTE)       Sepsis PCT Algorithm           Lower Respiratory Tract                                      Infection PCT Algorithm    ----------------------------     ----------------------------         PCT < 0.25 ng/mL                PCT < 0.10 ng/mL         Strongly encourage             Strongly discourage   discontinuation of antibiotics    initiation of antibiotics    ----------------------------     -----------------------------       PCT 0.25 - 0.50 ng/mL            PCT 0.10 - 0.25 ng/mL               OR       >80% decrease in PCT            Discourage initiation of  antibiotics      Encourage discontinuation           of antibiotics    ----------------------------     -----------------------------         PCT >= 0.50 ng/mL              PCT 0.26 - 0.50 ng/mL                AND       <80% decrease in PCT             Encourage initiation of                                             antibiotics       Encourage continuation           of antibiotics    ----------------------------     -----------------------------        PCT >= 0.50 ng/mL                  PCT > 0.50 ng/mL               AND         increase in PCT                  Strongly encourage                                      initiation of antibiotics    Strongly encourage  escalation           of antibiotics                                     -----------------------------                                           PCT <= 0.25 ng/mL                                                 OR                                        > 80% decrease in PCT                                     Discontinue / Do not initiate                                             antibiotics Performed at [place] [address], [place], [place] [address]   CBG monitoring, ED     Status: None   Collection Time:  12/09/19 12:53 PM  Result Value Ref Range   Glucose-Capillary 76 70 - 99 mg/dL    Comment: Glucose reference range applies only to samples taken after fasting for at least 8 hours.  CBG monitoring, ED     Status: Abnormal   Collection Time: 12/09/19  3:59 PM  Result Value Ref Range   Glucose-Capillary 133 (H) 70 - 99 mg/dL    Comment: Glucose reference range applies only to samples taken after fasting for at least 8 hours.  Basic metabolic panel     Status: Abnormal   Collection Time: 12/09/19  5:56 PM  Result Value Ref Range   Sodium 141 135 - 145 mmol/L   Potassium 5.6 (H) 3.5 - 5.1 mmol/L   Chloride 112 (H) 98 - 111 mmol/L   CO2 19 (L) 22 - 32 mmol/L   Glucose, Bld 204 (H) 70 - 99 mg/dL    Comment: Glucose reference range applies only to samples taken after fasting for at least 8 hours.   BUN 68 (H) 8 - 23 mg/dL   Creatinine, Ser 4.10 (H) 0.61 - 1.24 mg/dL   Calcium 7.6 (L) 8.9 - 10.3 mg/dL   GFR calc non Af Amer 14 (L) >60 mL/min   GFR calc Af Amer 17 (L) >60 mL/min   Anion gap 10 5 - 15    Comment: Performed at [facility], [place] [address], [place], [place] [address]  CBG monitoring, ED     Status: Abnormal   Collection Time: 12/09/19  7:50 PM  Result Value Ref Range   Glucose-Capillary 173 (H) 70 - 99 mg/dL    Comment: Glucose reference range applies only to samples taken after fasting for at least 8 hours.  CBG  monitoring, ED     Status: Abnormal   Collection Time: 12/09/19 11:47 PM  Result Value Ref Range   Glucose-Capillary 246 (H) 70 - 99 mg/dL    Comment: Glucose reference range applies only to samples taken after fasting for at least 8 hours.  CBG monitoring, ED     Status: Abnormal   Collection Time: 12/10/19  4:09 AM  Result Value Ref Range   Glucose-Capillary 254 (H) 70 - 99 mg/dL    Comment: Glucose reference range applies only to samples taken after fasting for at least 8 hours.  CBG monitoring, ED     Status: Abnormal   Collection Time: 12/10/19  4:40 AM  Result Value Ref Range   Glucose-Capillary 228 (H) 70 - 99 mg/dL    Comment: Glucose reference range applies only to samples taken after fasting for at least 8 hours.  Magnesium     Status: Abnormal   Collection Time: 12/10/19  5:00 AM  Result Value Ref Range   Magnesium 2.5 (H) 1.7 - 2.4 mg/dL    Comment: Performed at [facility], [place] [address], [place], [place] [address]  CBC WITH DIFFERENTIAL     Status: Abnormal   Collection Time: 12/10/19  5:00 AM  Result Value Ref Range   WBC 20.4 (H) 4.0 - 10.5 K/uL   RBC 3.66 (L) 4.22 - 5.81 MIL/uL   Hemoglobin 10.6 (L) 13.0 - 17.0 g/dL   HCT 34.5 (L) 39.0 - 52.0 %   MCV 94.3 80.0 - 100.0 fL   MCH 29.0 26.0 - 34.0 pg   MCHC 30.7 30.0 - 36.0 g/dL   RDW 15.7 (H) 11.5 - 15.5 %   Platelets 259 150 - 400 K/uL  nRBC 0.0 0.0 - 0.2 %   Neutrophils Relative % 90 %   Neutro Abs 18.5 (H) 1.7 - 7.7 K/uL   Lymphocytes Relative 3 %   Lymphs Abs 0.6 (L) 0.7 - 4.0 K/uL   Monocytes Relative 6 %   Monocytes Absolute 1.2 (H) 0.1 - 1.0 K/uL   Eosinophils Relative 0 %   Eosinophils Absolute 0.0 0.0 - 0.5 K/uL   Basophils Relative 0 %   Basophils Absolute 0.0 0.0 - 0.1 K/uL   Immature Granulocytes 1 %   Abs Immature Granulocytes 0.15 (H) 0.00 - 0.07 K/uL    Comment: Performed at [facility], [place] [address] [place], [place] [address]  Comprehensive  metabolic panel     Status: Abnormal   Collection Time: 12/10/19  5:00 AM  Result Value Ref Range   Sodium 139 135 - 145 mmol/L   Potassium 4.9 3.5 - 5.1 mmol/L   Chloride 111 98 - 111 mmol/L   CO2 20 (L) 22 - 32 mmol/L   Glucose, Bld 249 (H) 70 - 99 mg/dL    Comment: Glucose reference range applies only to samples taken after fasting for at least 8 hours.   BUN 65 (H) 8 - 23 mg/dL   Creatinine, Ser 3.57 (H) 0.61 - 1.24 mg/dL   Calcium 7.9 (L) 8.9 - 10.3 mg/dL   Total Protein 6.7 6.5 - 8.1 g/dL   Albumin 2.2 (L) 3.5 - 5.0 g/dL   AST 31 15 - 41 U/L   ALT 26 0 - 44 U/L   Alkaline Phosphatase 49 38 - 126 U/L   Total Bilirubin 0.9 0.3 - 1.2 mg/dL   GFR calc non Af Amer 17 (L) >60 mL/min   GFR calc Af Amer 20 (L) >60 mL/min   Anion gap 8 5 - 15    Comment: Performed at [facility], [place] [address] [place], [place] [address]  Vancomycin, random     Status: None   Collection Time: 12/10/19  5:00 AM  Result Value Ref Range   Vancomycin Rm 16     Comment:        Random Vancomycin therapeutic range is dependent on dosage and time of specimen collection. A peak range is 20.0-40.0 ug/mL A trough range is 5.0-15.0 ug/mL        Performed at [place] [address], [place],  [address]   Procalcitonin     Status: None   Collection Time: 12/10/19  5:00 AM  Result Value Ref Range   Procalcitonin 11.85 ng/mL    Comment:        Interpretation: PCT >= 10 ng/mL: Important systemic inflammatory response, almost exclusively due to severe bacterial sepsis or septic shock. (NOTE)       Sepsis PCT Algorithm           Lower Respiratory Tract                                      Infection PCT Algorithm    ----------------------------     ----------------------------         PCT < 0.25 ng/mL                PCT < 0.10 ng/mL         Strongly encourage             Strongly discourage  discontinuation of antibiotics    initiation of antibiotics     ----------------------------     -----------------------------       PCT 0.25 - 0.50 ng/mL            PCT 0.10 - 0.25 ng/mL               OR       >80% decrease in PCT            Discourage initiation of                                            antibiotics      Encourage discontinuation           of antibiotics    ----------------------------     -----------------------------         PCT >= 0.50 ng/mL              PCT 0.26 - 0.50 ng/mL                AND       <80% decrease in PCT             Encourage initiation of                                             antibiotics       Encourage continuation           of antibiotics    ----------------------------     -----------------------------        PCT >= 0.50 ng/mL                  PCT > 0.50 ng/mL               AND         increase in PCT                  Strongly encourage                                      initiation of antibiotics    Strongly encourage escalation           of antibiotics                                     -----------------------------                                           PCT <= 0.25 ng/mL                                                 OR                                        >  80% decrease in PCT                                     Discontinue / Do not initiate                                             antibiotics Performed at [place] [address] [place], [place] [address]   CBG monitoring, ED     Status: Abnormal   Collection Time: 12/10/19  8:02 AM  Result Value Ref Range   Glucose-Capillary 222 (H) 70 - 99 mg/dL    Comment: Glucose reference range applies only to samples taken after fasting for at least 8 hours.  CBG monitoring, ED     Status: Abnormal   Collection Time: 12/10/19 11:58 AM  Result Value Ref Range   Glucose-Capillary 193 (H) 70 - 99 mg/dL    Comment: Glucose reference range applies only to samples taken after fasting for at least 8 hours.  CBG  monitoring, ED     Status: Abnormal   Collection Time: 12/10/19  5:30 PM  Result Value Ref Range   Glucose-Capillary 228 (H) 70 - 99 mg/dL    Comment: Glucose reference range applies only to samples taken after fasting for at least 8 hours.   CT ABDOMEN PELVIS WO CONTRAST  Result Date: 12/09/2019 CLINICAL DATA:  Sepsis, abdominal pain tenderness severe acute kidney injury EXAM: CT ABDOMEN AND PELVIS WITHOUT CONTRAST TECHNIQUE: Multidetector CT imaging of the abdomen and pelvis was performed following the standard protocol without IV contrast. COMPARISON:  MRI abdomen August 18, 2019 FINDINGS: Lower chest: There is moderate cardiomegaly. Trace pericardial effusion/thickening is seen. Streaky atelectasis seen at both lung bases. There is a small hiatal hernia. Hepatobiliary: The liver is normal in density without focal abnormality.The main portal vein is patent. No evidence of calcified gallstones, gallbladder wall thickening or biliary dilatation. Pancreas: Unremarkable. No pancreatic ductal dilatation or surrounding inflammatory changes. Spleen: Normal in size without focal abnormality. Adrenals/Urinary Tract: Both adrenal glands appear normal. Again noted are several partially exophytic lesions within both kidneys. The largest which is slightly hyperdense within the upper pole of the right kidney measuring 3.8 cm most recently found to be a renal cyst. There scattered bilateral renal calculi present the largest measuring 6 mm in the lower pole of the left kidney. There is mild to moderate right-sided perinephric stranding, predominantly around the lower pole. No hydronephrosis however is seen. No ureteral or bladder calculi are noted. Stomach/Bowel: The stomach, small bowel, and colon are normal in appearance. No inflammatory changes, wall thickening, or obstructive findings.Scattered colonic diverticula are noted without diverticulitis. Vascular/Lymphatic: There are no enlarged mesenteric, retroperitoneal,  or pelvic lymph nodes. Scattered aortic atherosclerotic calcifications are seen without aneurysmal dilatation. Reproductive: The prostate is unremarkable. Other: Bilateral fat containing inguinal hernias are present. Musculoskeletal: No acute or significant osseous findings. IMPRESSION: Mild-to-moderate right-sided perinephric fat stranding changes which could be due to infectious /inflammatory process or recently passed stone. Bilateral nonobstructing renal calculi. Diverticulosis without diverticulitis. Aortic Atherosclerosis (ICD10-I70.0). Electronically Signed   By: Prudencio Pair M.D.   On: 12/09/2019 03:34   CT HEAD WO CONTRAST  Result Date: 12/09/2019 CLINICAL DATA:  Altered mental status. EXAM: CT HEAD WITHOUT CONTRAST TECHNIQUE: Contiguous axial images were  obtained from the base of the skull through the vertex without intravenous contrast. COMPARISON:  May 17, 2019 FINDINGS: Brain: No evidence of acute infarction, hemorrhage, hydrocephalus, extra-axial collection or mass lesion/mass effect. Vascular: No hyperdense vessel or unexpected calcification. Skull: Normal. Negative for fracture or focal lesion. Sinuses/Orbits: There is stable marked severity left maxillary sinus, left ethmoid sinus and sphenoid sinus mucosal thickening. Stable hyperdense secretions are again seen. A chronic deformity of the medial wall of the left maxillary sinus is also seen. Other: None. IMPRESSION: 1. No acute intracranial abnormality. 2. Stable left maxillary sinus, left ethmoid sinus and sphenoid sinus disease. Electronically Signed   By: Virgina Norfolk M.D.   On: 12/09/2019 03:16   DG Chest Port 1 View  Result Date: 12/08/2019 CLINICAL DATA:  Altered mental status, minimally responsive since Sunday. EXAM: PORTABLE CHEST 1 VIEW COMPARISON:  September 05, 2018 FINDINGS: There is no evidence of acute infiltrate, pleural effusion or pneumothorax. The heart size and mediastinal contours are within normal limits. Mild to  moderate severity degenerative changes are again seen within the mid to lower thoracic spine. IMPRESSION: No active disease. Electronically Signed   By: Thaddeus  Houston M.D.   On: 12/08/2019 21:22   DG Abd Portable 1V  Result Date: 12/10/2019 CLINICAL DATA:  Lower abdominal pain. Lethargy. EXAM: PORTABLE ABDOMEN - 1 VIEW COMPARISON:  12/09/2019 CT abdomen/pelvis FINDINGS: No disproportionately dilated small bowel loops. Mild colonic gas and stool. No evidence of pneumatosis or pneumoperitoneum. There is a 3 mm left mid renal stone. Mild lumbar spondylosis. Calcified venous phleboliths in the deep pelvis. IMPRESSION: 1. Nonobstructive bowel gas pattern. 2. Left nephrolithiasis. Electronically Signed   By: Jason A Poff M.D.   On: 12/10/2019 10:20    Pending Labs Unresulted Labs (From admission, onward)    Start     Ordered   12/11/19 0500  Creatinine, serum  Daily,   R     12/10/19 0736   12/11/19 0500  Basic metabolic panel  Tomorrow morning,   R     12/10/19 1702   12/11/19 0500  CBC  Tomorrow morning,   R     12/10/19 1702   12/10/19 0500  Procalcitonin  Daily,   R     04 /30/21 1141          Vitals/Pain Today's Vitals   12/10/19 1230 12/10/19 1245 12/10/19 1300 12/10/19 1725  BP: 136/69  (!) 142/74 124/68  Pulse: 61 (!) 59 (!) 55 65  Resp: 11 14 16 16   Temp:      TempSrc:      SpO2: 97% 97% 97% 95%  Weight:      Height:      PainSc:        Isolation Precautions No active isolations  Medications Medications  ceFEPIme (MAXIPIME) 2 g in sodium chloride 0.9 % 100 mL IVPB (2 g Intravenous New Bag/Given 12/10/19 0750)  aspirin EC tablet 81 mg (81 mg Oral Given 12/09/19 2156)  atorvastatin (LIPITOR) tablet 40 mg (40 mg Oral Given 12/10/19 1210)  pantoprazole (PROTONIX) injection 40 mg (40 mg Intravenous Given 12/10/19 0457)  ondansetron (ZOFRAN) tablet 4 mg (has no administration in time range)    Or  ondansetron (ZOFRAN) injection 4 mg (has no administration in time range)   heparin injection 5,000 Units (5,000 Units Subcutaneous Given 12/10/19 1450)  acetaminophen (TYLENOL) tablet 650 mg (has no administration in time range)    Or  acetaminophen (TYLENOL) suppository 650 mg (has no administration in  time range)  0.9 %  sodium chloride infusion ( Intravenous Stopped 12/10/19 0843)  predniSONE (DELTASONE) tablet 5 mg (has no administration in time range)  insulin aspart (novoLOG) injection 0-20 Units (has no administration in time range)  vancomycin (VANCOCIN) IVPB 1000 mg/200 mL premix (0 mg Intravenous Stopped 12/09/19 0000)  piperacillin-tazobactam (ZOSYN) IVPB 3.375 g (0 g Intravenous Stopped 12/09/19 0143)  sodium chloride 0.9 % bolus 1,000 mL (0 mLs Intravenous Stopped 12/08/19 2330)    And  sodium chloride 0.9 % bolus 1,000 mL (0 mLs Intravenous Stopped 12/08/19 2230)  vancomycin (VANCOCIN) IVPB 1000 mg/200 mL premix (0 mg Intravenous Stopped 12/09/19 0430)  dextrose 50 % solution 50 mL (50 mLs Intravenous Given 12/09/19 0330)    Mobility walks     Focused Assessments .   R Recommendations: See Admitting Provider Note  Report given to:   Additional Notes: n/a

## 2019-12-10 NOTE — Progress Notes (Signed)
PROGRESS NOTE    Marco [name]  [id number] DOB: [birth year] DOA: 12/08/2019 PCP: [name], MD   Brief Narrative: Marco [name] is a 64 y.o. male with past medical history of systolic and diastolic congestive heart failure, nonischemic cardiomyopathy, chronic kidney disease stage III, diabetes mellitus type 2, coronary artery disease, hypertension, obesity, obstructive sleep apnea, atrial fibrillation status post ablation 2017, psoriatic arthritis. Patient presented secondary to confusion and lethargy with concern for sepsis on admission. Started on empiric Vancomycin and Cefepime. Blood and urine cultures pending.   Assessment & Plan:   Principal Problem:   Sepsis due to Gram negative bacteria (HCC) Active Problems:   Type 2 diabetes mellitus with stage 3 chronic kidney disease, without long-term current use of insulin (HCC)   Diabetic polyneuropathy associated with type 2 diabetes mellitus (HCC)   Chronic combined systolic and diastolic CHF (congestive heart failure) (HCC)   Acute renal failure superimposed on stage 3 chronic kidney disease (HCC)   Acute metabolic encephalopathy   Hyperkalemia   Severe sepsis Present on admission. Likely UTI source. Associated AKI and hypotension requiring 30 cc/kg NS bolus. Blood and urine cultures obtained on admission. Vancomycin and Cefepime empirically started. CT abdomen suggests likely right kidney infection. Urine culture significant for E. Coli. -Discontinue Vancomycin and Flagyl -Continue Cefepime -Follow up blood and urine cultures  AKI on CKD stage III Baseline creatinine of 1.5. Creatinine on admission of 5.61. Trending down with IV fluids -Continue IV fluids -Strict in/out  Acute metabolic encephalopathy In setting of sepsis and infection. Improving slowly. CT head negative for acute process  Hypoglycemia In setting of poor oral intake. Started on D5 IV fluids. Resolved. -Discontinue D5 containing IV  fluids  Diabetes mellitus, type 2 Patient is on Humalog 75/25 150 units daily. Hypoglycemia during admission. -SSI qAC and HS  Hyperkalemia Resolved.  Depression On Cymbalta as an outpatient  Chronic diastolic heart failure Last Transthoracic Echocardiogram from 10/10/2019 significant for normal EF of 55-60%. On Coreg, Entresto, Lasix as an outpatient which were held on admission secondary to sepsis. -Watch fluid status -Daily weights  Essential hypertension Patient is on Coreg, Imdur, hydralazine, Entresto, lasix as an outpatient. Held secondary to sepsis and hypotension on admission.  Neuropathy On Lyrica as an outpatient.  Psoriatic arthritis On prednisone as an outpatient. Started on stress dose steroids on admission for hypotension. -Discontinue Solu-cortef, Restart home prednisone 5mg  in AM  Abdominal tenderness Normal bowel sounds. -Abdominal x-ray   DVT prophylaxis: Heparin subq Code Status:   Code Status: Full Code Family Communication: Wife on telephone Disposition Plan: Discharge home pending transition to oral antibiotics and culture data.   Consultants:   None  Procedures:   None  Antimicrobials:  Vancomycin  Cefepime   Flagyl   Subjective: Feels better. Some abdominal pain. Hungry.  Objective: Vitals:   12/10/19 0600 12/10/19 0630 12/10/19 0700 12/10/19 0730  BP: 126/65 129/65 126/66 120/67  Pulse: 67 65 62 62  Resp: 11 16 10 10   Temp:      TempSrc:      SpO2: 96% 95% 95% 96%  Weight:      Height:        Intake/Output Summary (Last 24 hours) at 12/10/2019 [id number] Last data filed at 12/09/2019 1523 Gross per 24 hour  Intake 600.91 ml  Output 500 ml  Net 100.91 ml   Filed Weights   12/08/19 2020  Weight: 108.9 kg    Examination:  General exam: Appears calm and comfortable Respiratory system:  Clear to auscultation. Respiratory effort normal. Cardiovascular system: S1 & S2 heard, RRR. No murmurs, rubs, gallops or  clicks. Gastrointestinal system: Abdomen is nondistended, soft and tender. No organomegaly or masses felt. Normal bowel sounds heard. Central nervous system: Alert and oriented. No focal neurological deficits. Extremities: No edema. No calf tenderness Skin: No cyanosis. No rashes Psychiatry: Judgement and insight appear normal. Mood & affect appropriate.     Data Reviewed: I have personally reviewed following labs and imaging studies  CBC: Recent Labs  Lab 12/08/19 2014 12/10/19 0500  WBC 17.9* 20.4*  NEUTROABS 14.6* 18.5*  HGB 10.8* 10.6*  HCT 34.6* 34.5*  MCV 94.8 94.3  PLT 270 [id number]   Basic Metabolic Panel: Recent Labs  Lab 12/08/19 2014 12/09/19 0210 12/09/19 1756 12/10/19 0500  NA 142 141 141 139  K 5.8* 4.8 5.6* 4.9  CL 108 109 112* 111  CO2 22 20* 19* 20*  GLUCOSE 111* 55* 204* 249*  BUN 73* 68* 68* 65*  CREATININE 5.61* 5.22* 4.10* 3.57*  CALCIUM 8.2* 7.7* 7.6* 7.9*  MG  --   --   --  2.5*  PHOS  --  5.2*  --   --    GFR: Estimated Creatinine Clearance: 24.2 mL/min (A) (by C-G formula based on SCr of 3.57 mg/dL (H)). Liver Function Tests: Recent Labs  Lab 12/08/19 2014 12/09/19 0210 12/10/19 0500  AST 24  --  31  ALT 20  --  26  ALKPHOS 45  --  49  BILITOT 0.8  --  0.9  PROT 7.5  --  6.7  ALBUMIN 2.7* 2.3* 2.2*   No results for input(s): LIPASE, AMYLASE in the last 168 hours. Recent Labs  Lab 12/09/19 0210  AMMONIA 26   Coagulation Profile: Recent Labs  Lab 12/08/19 2014  INR 1.3*   Cardiac Enzymes: No results for input(s): CKTOTAL, CKMB, CKMBINDEX, TROPONINI in the last 168 hours. BNP (last 3 results) No results for input(s): PROBNP in the last 8760 hours. HbA1C: Recent Labs    12/08/19 2000  HGBA1C 8.4*   CBG: Recent Labs  Lab 12/09/19 1950 12/09/19 2347 12/10/19 0409 12/10/19 0440 12/10/19 0802  GLUCAP 173* 246* 254* 228* 222*   Lipid Profile: No results for input(s): CHOL, HDL, LDLCALC, TRIG, CHOLHDL, LDLDIRECT in the  last 72 hours. Thyroid Function Tests: Recent Labs    12/09/19 0150  TSH 1.102   Anemia Panel: Recent Labs    12/09/19 0149 12/09/19 0150  VITAMINB12 104*  --   FOLATE  --  12.4  TIBC 158*  --   IRON 30*  --    Sepsis Labs: Recent Labs  Lab 12/08/19 2014 12/09/19 1200 12/10/19 0500  PROCALCITON  --  23.81 11.85  LATICACIDVEN 1.4  --   --     Recent Results (from the past 240 hour(s))  Blood Culture (routine x 2)     Status: None (Preliminary result)   Collection Time: 12/08/19  8:14 PM   Specimen: BLOOD LEFT FOREARM  Result Value Ref Range Status   Specimen Description BLOOD LEFT FOREARM  Final   Special Requests   Final    BOTTLES DRAWN AEROBIC ONLY Blood Culture results may not be optimal due to an inadequate volume of blood received in culture bottles Performed at [facility], [place] [address], [place], [place] [address]    Culture   Final    NO GROWTH 2 DAYS Performed at [facility]  [address], [place], [place] [address]    Report Status PENDING  Incomplete  Urine culture     Status: Abnormal (Preliminary result)   Collection Time: 12/08/19  8:14 PM   Specimen: In/Out Cath Urine  Result Value Ref Range Status   Specimen Description   Final    IN/OUT CATH URINE Performed at [facility] [address], [place], [place] [address]    Special Requests   Final    NONE Performed at [facility], [place] [address] [place], [place] [address]    Culture (A)  Final    >=100,000 COLONIES/mL ESCHERICHIA COLI SUSCEPTIBILITIES TO FOLLOW Performed at [facility], [place] [address], [place], [place] [address]    Report Status PENDING  Incomplete  Blood Culture (routine x 2)     Status: None (Preliminary result)   Collection Time: 12/08/19  8:45 PM   Specimen: BLOOD  Result Value Ref Range Status   Specimen Description   Final    BLOOD RIGHT HAND Performed at [facility] [address] [place], [place] [address]    Special Requests   Final    BOTTLES DRAWN AEROBIC AND ANAEROBIC Blood Culture results may not be optimal due to an excessive volume of blood received in culture bottles Performed at [facility] [address] [place], [place] [address]    Culture   Final    NO GROWTH 2 DAYS Performed at [facility] [address] [place], [place] [address]    Report Status PENDING  Incomplete  Respiratory Panel by RT PCR (Flu A&B, Covid) - Nasopharyngeal Swab     Status: None   Collection Time: 12/08/19 10:00 PM   Specimen: Nasopharyngeal Swab  Result Value Ref Range Status   SARS Coronavirus 2 by RT PCR NEGATIVE NEGATIVE Final    Comment: (NOTE) SARS-CoV-2 target nucleic acids are NOT DETECTED. The SARS-CoV-2 RNA is generally detectable in upper respiratoy specimens during the acute phase of infection. The lowest concentration of SARS-CoV-2 viral copies this assay can detect is 131 copies/mL. A negative result does not preclude SARS-Cov-2 infection and should not be used as the sole basis for treatment or other patient management decisions. A negative result may occur with  improper specimen collection/handling, submission of specimen other than nasopharyngeal swab, presence of viral mutation(s) within the areas targeted by this assay, and inadequate number of viral copies (<131 copies/mL). A negative result must be combined with clinical observations, patient history, and epidemiological information. The expected result is Negative. Fact Sheet for Patients:  PinkCheek.be Fact Sheet for Healthcare Providers:  GravelBags.it This test is not yet ap proved or cleared by the [place] FDA and  has been authorized for detection and/or diagnosis of SARS-CoV-2 by FDA under an Emergency Use Authorization (EUA). This EUA will remain  in effect (meaning this test can  be used) for the duration of the COVID-19 declaration under Section 564(b)(1) of the Act, 21 U.S.C. section 360bbb-3(b)(1), unless the authorization is terminated or revoked sooner.    Influenza A by PCR NEGATIVE NEGATIVE Final   Influenza B by PCR NEGATIVE NEGATIVE Final    Comment: (NOTE) The Xpert Xpress SARS-CoV-2/FLU/RSV assay is intended as an aid in  the diagnosis of influenza from Nasopharyngeal swab specimens and  should not be used as a sole basis for treatment. Nasal washings and  aspirates are unacceptable for Xpert Xpress SARS-CoV-2/FLU/RSV  testing. Fact Sheet for Patients: PinkCheek.be  Fact Sheet for Healthcare Providers: GravelBags.it This test is not yet approved or cleared by the [place] FDA and  has been authorized for detection and/or diagnosis of SARS-CoV-2 by  FDA under an Emergency Use Authorization (EUA). This EUA will remain  in effect (meaning this test can be used) for the duration of the  Covid-19 declaration under Section 564(b)(1) of the Act, 21  U.S.C. section 360bbb-3(b)(1), unless the authorization is  terminated or revoked. Performed at [facility], [place] [address], [place], [place] [address]          Radiology Studies: CT ABDOMEN PELVIS WO CONTRAST  Result Date: 12/09/2019 CLINICAL DATA:  Sepsis, abdominal pain tenderness severe acute kidney injury EXAM: CT ABDOMEN AND PELVIS WITHOUT CONTRAST TECHNIQUE: Multidetector CT imaging of the abdomen and pelvis was performed following the standard protocol without IV contrast. COMPARISON:  MRI abdomen August 18, 2019 FINDINGS: Lower chest: There is moderate cardiomegaly. Trace pericardial effusion/thickening is seen. Streaky atelectasis seen at both lung bases. There is a small hiatal hernia. Hepatobiliary: The liver is normal in density without focal abnormality.The main portal vein is patent. No evidence of calcified  gallstones, gallbladder wall thickening or biliary dilatation. Pancreas: Unremarkable. No pancreatic ductal dilatation or surrounding inflammatory changes. Spleen: Normal in size without focal abnormality. Adrenals/Urinary Tract: Both adrenal glands appear normal. Again noted are several partially exophytic lesions within both kidneys. The largest which is slightly hyperdense within the upper pole of the right kidney measuring 3.8 cm most recently found to be a renal cyst. There scattered bilateral renal calculi present the largest measuring 6 mm in the lower pole of the left kidney. There is mild to moderate right-sided perinephric stranding, predominantly around the lower pole. No hydronephrosis however is seen. No ureteral or bladder calculi are noted. Stomach/Bowel: The stomach, small bowel, and colon are normal in appearance. No inflammatory changes, wall thickening, or obstructive findings.Scattered colonic diverticula are noted without diverticulitis. Vascular/Lymphatic: There are no enlarged mesenteric, retroperitoneal, or pelvic lymph nodes. Scattered aortic atherosclerotic calcifications are seen without aneurysmal dilatation. Reproductive: The prostate is unremarkable. Other: Bilateral fat containing inguinal hernias are present. Musculoskeletal: No acute or significant osseous findings. IMPRESSION: Mild-to-moderate right-sided perinephric fat stranding changes which could be due to infectious /inflammatory process or recently passed stone. Bilateral nonobstructing renal calculi. Diverticulosis without diverticulitis. Aortic Atherosclerosis (ICD10-I70.0). Electronically Signed   By: Prudencio Pair M.D.   On: 12/09/2019 03:34   CT HEAD WO CONTRAST  Result Date: 12/09/2019 CLINICAL DATA:  Altered mental status. EXAM: CT HEAD WITHOUT CONTRAST TECHNIQUE: Contiguous axial images were obtained from the base of the skull through the vertex without intravenous contrast. COMPARISON:  May 17, 2019 FINDINGS:  Brain: No evidence of acute infarction, hemorrhage, hydrocephalus, extra-axial collection or mass lesion/mass effect. Vascular: No hyperdense vessel or unexpected calcification. Skull: Normal. Negative for fracture or focal lesion. Sinuses/Orbits: There is stable marked severity left maxillary sinus, left ethmoid sinus and sphenoid sinus mucosal thickening. Stable hyperdense secretions are again seen. A chronic deformity of the medial wall of the left maxillary sinus is also seen. Other: None. IMPRESSION: 1. No acute intracranial abnormality. 2. Stable left maxillary sinus, left ethmoid sinus and sphenoid sinus disease. Electronically Signed   By: Virgina Norfolk M.D.   On: 12/09/2019 03:16   DG Chest Port 1 View  Result Date: 12/08/2019 CLINICAL DATA:  Altered mental status, minimally responsive since Sunday. EXAM: PORTABLE CHEST 1 VIEW COMPARISON:  September 05, 2018 FINDINGS: There is no  evidence of acute infiltrate, pleural effusion or pneumothorax. The heart size and mediastinal contours are within normal limits. Mild to moderate severity degenerative changes are again seen within the mid to lower thoracic spine. IMPRESSION: No active disease. Electronically Signed   By: Virgina Norfolk M.D.   On: 12/08/2019 21:22        Scheduled Meds: . aspirin  81 mg Oral QHS  . atorvastatin  40 mg Oral Daily  . heparin  5,000 Units Subcutaneous Q8H  . hydrocortisone sod succinate (SOLU-CORTEF) inj  50 mg Intravenous Q6H  . insulin aspart  0-20 Units Subcutaneous Q4H  . pantoprazole (PROTONIX) IV  40 mg Intravenous Q24H   Continuous Infusions: . sodium chloride    . ceFEPime (MAXIPIME) IV 2 g (12/10/19 0750)     LOS: 1 day     Cordelia Poche, MD [facility] 12/10/2019, 8:22 AM  If 7PM-7AM, please contact night-coverage [email or web address]

## 2019-12-11 ENCOUNTER — Other Ambulatory Visit: Payer: Self-pay

## 2019-12-11 LAB — BASIC METABOLIC PANEL
Anion gap: 5 (ref 5–15)
BUN: 53 mg/dL — ABNORMAL HIGH (ref 8–23)
CO2: 21 mmol/L — ABNORMAL LOW (ref 22–32)
Calcium: 7.8 mg/dL — ABNORMAL LOW (ref 8.9–10.3)
Chloride: 116 mmol/L — ABNORMAL HIGH (ref 98–111)
Creatinine, Ser: 2.66 mg/dL — ABNORMAL HIGH (ref 0.61–1.24)
GFR calc Af Amer: 28 mL/min — ABNORMAL LOW (ref >60)
GFR calc non Af Amer: 24 mL/min — ABNORMAL LOW (ref >60)
Glucose, Bld: 136 mg/dL — ABNORMAL HIGH (ref 70–99)
Potassium: 4.1 mmol/L (ref 3.5–5.1)
Sodium: 142 mmol/L (ref 135–145)

## 2019-12-11 LAB — CBC
HCT: 34.3 % — ABNORMAL LOW (ref 39.0–52.0)
Hemoglobin: 10.8 g/dL — ABNORMAL LOW (ref 13.0–17.0)
MCH: 29.8 pg (ref 26.0–34.0)
MCHC: 31.5 g/dL (ref 30.0–36.0)
MCV: 94.5 fL (ref 80.0–100.0)
Platelets: 260 10*3/uL (ref 150–400)
RBC: 3.63 MIL/uL — ABNORMAL LOW (ref 4.22–5.81)
RDW: 15.5 % (ref 11.5–15.5)
WBC: 15.9 10*3/uL — ABNORMAL HIGH (ref 4.0–10.5)
nRBC: 0 % (ref 0.0–0.2)

## 2019-12-11 LAB — GLUCOSE, CAPILLARY
Glucose-Capillary: 103 mg/dL — ABNORMAL HIGH (ref 70–99)
Glucose-Capillary: 161 mg/dL — ABNORMAL HIGH (ref 70–99)
Glucose-Capillary: 193 mg/dL — ABNORMAL HIGH (ref 70–99)
Glucose-Capillary: 197 mg/dL — ABNORMAL HIGH (ref 70–99)

## 2019-12-11 LAB — PROCALCITONIN: Procalcitonin: 5.86 ng/mL

## 2019-12-11 MED ORDER — PANTOPRAZOLE SODIUM 40 MG PO TBEC
40.0000 mg | DELAYED_RELEASE_TABLET | Freq: Every day | ORAL | Status: DC
  Administered 2019-12-12 – 2019-12-13 (×2): 40 mg via ORAL
  Filled 2019-12-11 (×2): qty 1

## 2019-12-11 MED ORDER — SODIUM CHLORIDE 0.9 % IV SOLN
2.0000 g | INTRAVENOUS | Status: DC
  Administered 2019-12-11 – 2019-12-13 (×3): 2 g via INTRAVENOUS
  Filled 2019-12-11 (×3): qty 2

## 2019-12-11 MED ORDER — INSULIN GLARGINE 100 UNIT/ML ~~LOC~~ SOLN
10.0000 [IU] | Freq: Every day | SUBCUTANEOUS | Status: DC
  Administered 2019-12-11 – 2019-12-13 (×3): 10 [IU] via SUBCUTANEOUS
  Filled 2019-12-11 (×3): qty 0.1

## 2019-12-11 MED ORDER — PREGABALIN 100 MG PO CAPS
100.0000 mg | ORAL_CAPSULE | Freq: Three times a day (TID) | ORAL | Status: DC
  Administered 2019-12-11 – 2019-12-13 (×7): 100 mg via ORAL
  Filled 2019-12-11 (×7): qty 1

## 2019-12-11 MED ORDER — LORAZEPAM 1 MG PO TABS
1.0000 mg | ORAL_TABLET | Freq: Four times a day (QID) | ORAL | Status: DC | PRN
  Administered 2019-12-11: 1 mg via ORAL
  Filled 2019-12-11: qty 1

## 2019-12-11 MED ORDER — OXYCODONE HCL 5 MG PO TABS
5.0000 mg | ORAL_TABLET | Freq: Four times a day (QID) | ORAL | Status: DC
  Administered 2019-12-11 – 2019-12-13 (×9): 5 mg via ORAL
  Filled 2019-12-11 (×9): qty 1

## 2019-12-11 MED ORDER — SODIUM CHLORIDE 0.45 % IV SOLN: INTRAVENOUS | Status: DC

## 2019-12-11 MED ORDER — ISOSORBIDE MONONITRATE ER 30 MG PO TB24
30.0000 mg | ORAL_TABLET | Freq: Every day | ORAL | Status: DC
  Administered 2019-12-11 – 2019-12-13 (×3): 30 mg via ORAL
  Filled 2019-12-11 (×3): qty 1

## 2019-12-11 MED ORDER — CARVEDILOL 12.5 MG PO TABS
12.5000 mg | ORAL_TABLET | Freq: Two times a day (BID) | ORAL | Status: DC
  Administered 2019-12-11 – 2019-12-13 (×5): 12.5 mg via ORAL
  Filled 2019-12-11 (×5): qty 1

## 2019-12-11 NOTE — Evaluation (Signed)
Physical Therapy Evaluation Patient Details Name: Marco [name] MRN: [id number] DOB: [birth year] Today's Date: 12/11/2019   History of Present Illness  64 y.o. male with past medical history of systolic and diastolic congestive heart failure, nonischemic cardiomyopathy, chronic kidney disease stage III, diabetes mellitus type 2, coronary artery disease, hypertension, obesity, obstructive sleep apnea, atrial fibrillation status post ablation 2017, psoriatic arthritis. Patient presented secondary to confusion and lethargy with concern for sepsis on admission.  Clinical Impression  Pt admitted with above diagnosis.  Pt reports independence at his baseline. Pt much weaker today, fatigues rapidly with hallway ambulation. Will continue to follow in acute setting. Consider HHPT  if progresses slowly, may need RW for home  Pt currently with functional limitations due to the deficits listed below (see PT Problem List). Pt will benefit from skilled PT to increase their independence and safety with mobility to allow discharge to the venue listed below.       Follow Up Recommendations No PT follow up    Equipment Recommendations  (TBD-?RW)    Recommendations for Other Services       Precautions / Restrictions Precautions Precautions: Fall Restrictions Weight Bearing Restrictions: No      Mobility  Bed Mobility Overal bed mobility: Needs Assistance Bed Mobility: Supine to Sit;Sit to Supine     Supine to sit: Min guard;Supervision;HOB elevated Sit to supine: Min guard   General bed mobility comments: for safety, heavy use of rail  Transfers Overall transfer level: Needs assistance Equipment used: Rolling walker (2 wheeled) Transfers: Sit to/from Stand Sit to Stand: Min assist         General transfer comment: incr time, assist to rise and steady  Ambulation/Gait Ambulation/Gait assistance: Min guard;Min assist Gait Distance (Feet): 120 Feet Assistive device: 1 person hand held  assist Gait Pattern/deviations: Step-through pattern;Decreased stride length;Wide base of support Gait velocity: decr   General Gait Details: cues for trunk extension. pt with slow unsteady gait, without overt LOB, attempts to furniture walk in room (denies furniture walkign at home). incr WOB with 3/4 DOE after amb,SpO2=97% on RA, HR 64  Stairs            Wheelchair Mobility    Modified Rankin (Stroke Patients Only)       Balance Overall balance assessment: Needs assistance   Sitting balance-Leahy Scale: Fair       Standing balance-Leahy Scale: Fair                               Pertinent Vitals/Pain Pain Assessment: 0-10 Pain Score: 3  Pain Location: generalized Pain Descriptors / Indicators: Discomfort Pain Intervention(s): Limited activity within patient's tolerance;Monitored during session    Home Living Family/patient expects to be discharged to:: Private residence Living Arrangements: Spouse/significant other Available Help at Discharge: Family Type of Home: House Home Access: Level entry     Home Layout: One level Home Equipment: None      Prior Function Level of Independence: Independent               Hand Dominance        Extremity/Trunk Assessment   Upper Extremity Assessment Upper Extremity Assessment: Overall WFL for tasks assessed    Lower Extremity Assessment Lower Extremity Assessment: Overall WFL for tasks assessed       Communication   Communication: No difficulties  Cognition Arousal/Alertness: Awake/alert Behavior During Therapy: WFL for tasks assessed/performed Overall Cognitive Status: Within Functional Limits  for tasks assessed                                        General Comments      Exercises     Assessment/Plan    PT Assessment Patient needs continued PT services  PT Problem List Decreased mobility;Decreased activity tolerance;Decreased balance;Decreased knowledge of use  of DME       PT Treatment Interventions DME instruction;Gait training;Functional mobility training;Therapeutic activities;Patient/family education;Therapeutic exercise    PT Goals (Current goals can be found in the Care Plan section)  Acute Rehab PT Goals Patient Stated Goal: feel better PT Goal Formulation: With patient Time For Goal Achievement: 12/25/19 Potential to Achieve Goals: Good    Frequency Min 3X/week   Barriers to discharge        Co-evaluation               AM-PAC PT "6 Clicks" Mobility  Outcome Measure Help needed turning from your back to your side while in a flat bed without using bedrails?: A Little Help needed moving from lying on your back to sitting on the side of a flat bed without using bedrails?: A Little Help needed moving to and from a bed to a chair (including a wheelchair)?: A Little Help needed standing up from a chair using your arms (e.g., wheelchair or bedside chair)?: A Little Help needed to walk in hospital room?: A Little Help needed climbing 3-5 steps with a railing? : A Lot 6 Click Score: 17    End of Session Equipment Utilized During Treatment: Gait belt Activity Tolerance: Patient limited by fatigue Patient left: in bed;with call bell/phone within reach;with bed alarm set   PT Visit Diagnosis: Difficulty in walking, not elsewhere classified (R26.2)    Time: [id number]) PT Time Calculation (min) (ACUTE ONLY): 12 min   Charges:   PT Evaluation $PT Eval Low Complexity: [place], PT   Acute Rehab Dept [facility]): [id number]   12/11/2019   [facility] 12/11/2019, 4:28 PM

## 2019-12-11 NOTE — Progress Notes (Signed)
PROGRESS NOTE    Marco [name]  [id number] DOB: [birth year] DOA: 12/08/2019 PCP: [name], MD   Brief Narrative: Marco [name] is a 64 y.o. male with past medical history of systolic and diastolic congestive heart failure, nonischemic cardiomyopathy, chronic kidney disease stage III, diabetes mellitus type 2, coronary artery disease, hypertension, obesity, obstructive sleep apnea, atrial fibrillation status post ablation 2017, psoriatic arthritis. Patient presented secondary to confusion and lethargy with concern for sepsis on admission. Started on empiric Vancomycin and Cefepime. Blood and urine cultures pending.   Assessment & Plan:   Principal Problem:   Sepsis due to Gram negative bacteria (HCC) Active Problems:   Type 2 diabetes mellitus with stage 3 chronic kidney disease, without long-term current use of insulin (HCC)   Diabetic polyneuropathy associated with type 2 diabetes mellitus (HCC)   Chronic combined systolic and diastolic CHF (congestive heart failure) (HCC)   Acute renal failure superimposed on stage 3 chronic kidney disease (HCC)   Acute metabolic encephalopathy   Hyperkalemia   Severe sepsis (HCC)   Severe sepsis Present on admission. Likely UTI source. Associated AKI and hypotension requiring 30 cc/kg NS bolus. Blood and urine cultures obtained on admission. Vancomycin and Cefepime empirically started. CT abdomen suggests likely right kidney infection. Urine culture significant for E. Coli. -Transition to Ceftriaxone  AKI on CKD stage III Baseline creatinine of 1.5. Creatinine on admission of 5.61. Trending down with IV fluids. -Strict in/out -Hold IV fluids  Acute metabolic encephalopathy In setting of sepsis and infection. Improving slowly. CT head negative for acute process  Hypoglycemia In setting of poor oral intake. Started on D5 IV fluids. Resolved. -Discontinue D5 containing IV fluids  Diabetes mellitus, type 2 Patient is on Humalog  75/25 150 units daily. Hypoglycemia during admission. -SSI qAC and HS -Start Lantus 10 units daily  Hyperkalemia Resolved.  Depression On Cymbalta as an outpatient  Chronic combined systolic and diastolic heart failure Last Transthoracic Echocardiogram from 10/10/2019 significant for normal EF of 55-60%. On Coreg, Entresto, Lasix as an outpatient which were held on admission secondary to sepsis. -Watch fluid status -Daily weights  Essential hypertension Patient is on Coreg, Imdur, hydralazine, Entresto, lasix as an outpatient. Held secondary to sepsis and hypotension on admission. -Restart Coreg and Imdur  Neuropathy On Lyrica as an outpatient. -Restart Lyrica  Psoriatic arthritis On prednisone as an outpatient. Started on stress dose steroids on admission for hypotension. -Continue home prednisone 5 mg daily -Restart home oxycodone  Abdominal tenderness Normal bowel sounds. Unremarkable abdominal x-ray  Chest pain Atypical. Reproducible. Obtained EKG which was unremarkable for ACS concern. Blood pressure elevated.   DVT prophylaxis: Heparin subq Code Status:   Code Status: Full Code Family Communication: None at bedside Disposition Plan: Discharge home pending transition to oral antibiotics and culture data.   Consultants:   None  Procedures:   None  Antimicrobials:  Vancomycin  Cefepime   Flagyl   Subjective: Feels better. Some abdominal pain. Hungry.  Objective: Vitals:   12/10/19 2253 12/11/19 0411 12/11/19 0639 12/11/19 0914  BP: 124/78  120/80 (!) 175/75  Pulse: 84     Resp: 15  17   Temp: 98.4 F (36.9 C)  98.2 F (36.8 C)   TempSrc: Oral  Oral   SpO2: 96%  97%   Weight:  110.3 kg    Height:        Intake/Output Summary (Last 24 hours) at 12/11/2019 0942 Last data filed at 12/11/2019 0927 Gross per 24 hour  Intake 1375.98 ml  Output 1150 ml  Net 225.98 ml   Filed Weights   12/08/19 2020 12/11/19 0411  Weight: 108.9 kg 110.3 kg     Examination:  General exam: Appears calm and comfortable Respiratory system: Clear to auscultation. Respiratory effort normal. Cardiovascular system: S1 & S2 heard, RRR. No murmurs, rubs, gallops or clicks. Reproducible sternal chest pain. Gastrointestinal system: Abdomen is distended, soft and tender although patient is tender everywhere he is touched. No organomegaly or masses felt. Normal bowel sounds heard. Central nervous system: Alert and oriented. No focal neurological deficits. Extremities: Mild edema. No calf tenderness Skin: No cyanosis. No rashes Psychiatry: Judgement and insight appear normal. Mood & affect appropriate.      Data Reviewed: I have personally reviewed following labs and imaging studies  CBC: Recent Labs  Lab 12/08/19 2014 12/10/19 0500 12/11/19 0428  WBC 17.9* 20.4* 15.9*  NEUTROABS 14.6* 18.5*  --   HGB 10.8* 10.6* 10.8*  HCT 34.6* 34.5* 34.3*  MCV 94.8 94.3 94.5  PLT 270 259 [id number]   Basic Metabolic Panel: Recent Labs  Lab 12/08/19 2014 12/09/19 0210 12/09/19 1756 12/10/19 0500 12/11/19 0428  NA 142 141 141 139 142  K 5.8* 4.8 5.6* 4.9 4.1  CL 108 109 112* 111 116*  CO2 22 20* 19* 20* 21*  GLUCOSE 111* 55* 204* 249* 136*  BUN 73* 68* 68* 65* 53*  CREATININE 5.61* 5.22* 4.10* 3.57* 2.66*  CALCIUM 8.2* 7.7* 7.6* 7.9* 7.8*  MG  --   --   --  2.5*  --   PHOS  --  5.2*  --   --   --    GFR: Estimated Creatinine Clearance: 32.7 mL/min (A) (by C-G formula based on SCr of 2.66 mg/dL (H)). Liver Function Tests: Recent Labs  Lab 12/08/19 2014 12/09/19 0210 12/10/19 0500  AST 24  --  31  ALT 20  --  26  ALKPHOS 45  --  49  BILITOT 0.8  --  0.9  PROT 7.5  --  6.7  ALBUMIN 2.7* 2.3* 2.2*   No results for input(s): LIPASE, AMYLASE in the last 168 hours. Recent Labs  Lab 12/09/19 0210  AMMONIA 26   Coagulation Profile: Recent Labs  Lab 12/08/19 2014  INR 1.3*   Cardiac Enzymes: No results for input(s): CKTOTAL, CKMB,  CKMBINDEX, TROPONINI in the last 168 hours. BNP (last 3 results) No results for input(s): PROBNP in the last 8760 hours. HbA1C: Recent Labs    12/08/19 2000  HGBA1C 8.4*   CBG: Recent Labs  Lab 12/10/19 0440 12/10/19 0802 12/10/19 1158 12/10/19 1730 12/11/19 0733  GLUCAP 228* 222* 193* 228* 103*   Lipid Profile: No results for input(s): CHOL, HDL, LDLCALC, TRIG, CHOLHDL, LDLDIRECT in the last 72 hours. Thyroid Function Tests: Recent Labs    12/09/19 0150  TSH 1.102   Anemia Panel: Recent Labs    12/09/19 0149 12/09/19 0150  VITAMINB12 104*  --   FOLATE  --  12.4  TIBC 158*  --   IRON 30*  --    Sepsis Labs: Recent Labs  Lab 12/08/19 2014 12/09/19 1200 12/10/19 0500 12/11/19 0428  PROCALCITON  --  23.81 11.85 5.86  LATICACIDVEN 1.4  --   --   --     Recent Results (from the past 240 hour(s))  Blood Culture (routine x 2)     Status: None (Preliminary result)   Collection Time: 12/08/19  8:14 PM   Specimen: BLOOD  LEFT FOREARM  Result Value Ref Range Status   Specimen Description BLOOD LEFT FOREARM  Final   Special Requests   Final    BOTTLES DRAWN AEROBIC ONLY Blood Culture results may not be optimal due to an inadequate volume of blood received in culture bottles Performed at [facility], [place] [address] [place], [place] [address]    Culture   Final    NO GROWTH 3 DAYS Performed at [facility], [place] [address], [place], [place] [address]    Report Status PENDING  Incomplete  Urine culture     Status: Abnormal   Collection Time: 12/08/19  8:14 PM   Specimen: In/Out Cath Urine  Result Value Ref Range Status   Specimen Description   Final    IN/OUT CATH URINE Performed at [place] [address], [place], [place] [address]    Special Requests   Final    NONE Performed at [facility], [place] [address] [place], [place] [address]    Culture >=100,000 COLONIES/mL ESCHERICHIA COLI (A)   Final   Report Status 12/10/2019 FINAL  Final   Organism ID, Bacteria ESCHERICHIA COLI (A)  Final      Susceptibility   Escherichia coli - MIC*    AMPICILLIN >=32 RESISTANT Resistant     CEFAZOLIN >=64 RESISTANT Resistant     CEFTRIAXONE <=1 SENSITIVE Sensitive     CIPROFLOXACIN <=0.25 SENSITIVE Sensitive     GENTAMICIN <=1 SENSITIVE Sensitive     IMIPENEM <=0.25 SENSITIVE Sensitive     NITROFURANTOIN <=16 SENSITIVE Sensitive     TRIMETH/SULFA <=20 SENSITIVE Sensitive     AMPICILLIN/SULBACTAM >=32 RESISTANT Resistant     PIP/TAZO 8 SENSITIVE Sensitive     * >=100,000 COLONIES/mL ESCHERICHIA COLI  Blood Culture (routine x 2)     Status: None (Preliminary result)   Collection Time: 12/08/19  8:45 PM   Specimen: BLOOD  Result Value Ref Range Status   Specimen Description   Final    BLOOD RIGHT HAND Performed at [facility] [address], [place], [place] [address]    Special Requests   Final    BOTTLES DRAWN AEROBIC AND ANAEROBIC Blood Culture results may not be optimal due to an excessive volume of blood received in culture bottles Performed at [place] [address] [place], [place] [address]    Culture   Final    NO GROWTH 3 DAYS Performed at [facility], [place] [address] [place], [place] [address]    Report Status PENDING  Incomplete  Respiratory Panel by RT PCR (Flu A&B, Covid) - Nasopharyngeal Swab     Status: None   Collection Time: 12/08/19 10:00 PM   Specimen: Nasopharyngeal Swab  Result Value Ref Range Status   SARS Coronavirus 2 by RT PCR NEGATIVE NEGATIVE Final    Comment: (NOTE) SARS-CoV-2 target nucleic acids are NOT DETECTED. The SARS-CoV-2 RNA is generally detectable in upper respiratoy specimens during the acute phase of infection. The lowest concentration of SARS-CoV-2 viral copies this assay can detect is 131 copies/mL. A negative result does not preclude SARS-Cov-2 infection and should not be used as  the sole basis for treatment or other patient management decisions. A negative result may occur with  improper specimen collection/handling, submission of specimen other than nasopharyngeal swab, presence of viral mutation(s) within the areas targeted by this assay, and inadequate number of viral copies (<131 copies/mL). A negative result  must be combined with clinical observations, patient history, and epidemiological information. The expected result is Negative. Fact Sheet for Patients:  PinkCheek.be Fact Sheet for Healthcare Providers:  GravelBags.it This test is not yet ap proved or cleared by the [place] FDA and  has been authorized for detection and/or diagnosis of SARS-CoV-2 by FDA under an Emergency Use Authorization (EUA). This EUA will remain  in effect (meaning this test can be used) for the duration of the COVID-19 declaration under Section 564(b)(1) of the Act, 21 U.S.C. section 360bbb-3(b)(1), unless the authorization is terminated or revoked sooner.    Influenza A by PCR NEGATIVE NEGATIVE Final   Influenza B by PCR NEGATIVE NEGATIVE Final    Comment: (NOTE) The Xpert Xpress SARS-CoV-2/FLU/RSV assay is intended as an aid in  the diagnosis of influenza from Nasopharyngeal swab specimens and  should not be used as a sole basis for treatment. Nasal washings and  aspirates are unacceptable for Xpert Xpress SARS-CoV-2/FLU/RSV  testing. Fact Sheet for Patients: PinkCheek.be Fact Sheet for Healthcare Providers: GravelBags.it This test is not yet approved or cleared by the [place] FDA and  has been authorized for detection and/or diagnosis of SARS-CoV-2 by  FDA under an Emergency Use Authorization (EUA). This EUA will remain  in effect (meaning this test can be used) for the duration of the  Covid-19 declaration under Section 564(b)(1) of the Act, 21   U.S.C. section 360bbb-3(b)(1), unless the authorization is  terminated or revoked. Performed at [facility], [place] [address], [place], [place] [address]          Radiology Studies: DG Abd Portable 1V  Result Date: 12/10/2019 CLINICAL DATA:  Lower abdominal pain. Lethargy. EXAM: PORTABLE ABDOMEN - 1 VIEW COMPARISON:  12/09/2019 CT abdomen/pelvis FINDINGS: No disproportionately dilated small bowel loops. Mild colonic gas and stool. No evidence of pneumatosis or pneumoperitoneum. There is a 3 mm left mid renal stone. Mild lumbar spondylosis. Calcified venous phleboliths in the deep pelvis. IMPRESSION: 1. Nonobstructive bowel gas pattern. 2. Left nephrolithiasis. Electronically Signed   By: Ilona Sorrel M.D.   On: 12/10/2019 10:20        Scheduled Meds: . aspirin  81 mg Oral QHS  . atorvastatin  40 mg Oral Daily  . carvedilol  12.5 mg Oral BID WC  . heparin  5,000 Units Subcutaneous Q8H  . insulin aspart  0-20 Units Subcutaneous TID WC  . insulin glargine  10 Units Subcutaneous Daily  . isosorbide mononitrate  30 mg Oral Daily  . oxyCODONE  5 mg Oral Q6H  . [START ON 12/12/2019] pantoprazole  40 mg Oral Daily  . predniSONE  5 mg Oral Q breakfast   Continuous Infusions: . sodium chloride 100 mL/hr at 12/11/19 0823  . cefTRIAXone (ROCEPHIN)  IV 2 g (12/11/19 0919)     LOS: 2 days     Cordelia Poche, MD [facility] 12/11/2019, 9:42 AM  If 7PM-7AM, please contact night-coverage [email or web address]

## 2019-12-12 LAB — CBC
HCT: 33.9 % — ABNORMAL LOW (ref 39.0–52.0)
Hemoglobin: 10.6 g/dL — ABNORMAL LOW (ref 13.0–17.0)
MCH: 29.9 pg (ref 26.0–34.0)
MCHC: 31.3 g/dL (ref 30.0–36.0)
MCV: 95.8 fL (ref 80.0–100.0)
Platelets: 267 10*3/uL (ref 150–400)
RBC: 3.54 MIL/uL — ABNORMAL LOW (ref 4.22–5.81)
RDW: 15.7 % — ABNORMAL HIGH (ref 11.5–15.5)
WBC: 14.3 10*3/uL — ABNORMAL HIGH (ref 4.0–10.5)
nRBC: 0 % (ref 0.0–0.2)

## 2019-12-12 LAB — BASIC METABOLIC PANEL
Anion gap: 6 (ref 5–15)
BUN: 42 mg/dL — ABNORMAL HIGH (ref 8–23)
CO2: 22 mmol/L (ref 22–32)
Calcium: 7.8 mg/dL — ABNORMAL LOW (ref 8.9–10.3)
Chloride: 114 mmol/L — ABNORMAL HIGH (ref 98–111)
Creatinine, Ser: 2.27 mg/dL — ABNORMAL HIGH (ref 0.61–1.24)
GFR calc Af Amer: 34 mL/min — ABNORMAL LOW (ref >60)
GFR calc non Af Amer: 29 mL/min — ABNORMAL LOW (ref >60)
Glucose, Bld: 96 mg/dL (ref 70–99)
Potassium: 4.3 mmol/L (ref 3.5–5.1)
Sodium: 142 mmol/L (ref 135–145)

## 2019-12-12 LAB — CREATININE, SERUM
Creatinine, Ser: 2.24 mg/dL — ABNORMAL HIGH (ref 0.61–1.24)
GFR calc Af Amer: 35 mL/min — ABNORMAL LOW (ref >60)
GFR calc non Af Amer: 30 mL/min — ABNORMAL LOW (ref >60)

## 2019-12-12 LAB — GLUCOSE, CAPILLARY
Glucose-Capillary: 129 mg/dL — ABNORMAL HIGH (ref 70–99)
Glucose-Capillary: 228 mg/dL — ABNORMAL HIGH (ref 70–99)
Glucose-Capillary: 206 mg/dL — ABNORMAL HIGH (ref 70–99)
Glucose-Capillary: 159 mg/dL — ABNORMAL HIGH (ref 70–99)

## 2019-12-12 NOTE — Evaluation (Signed)
Occupational Therapy Evaluation Patient Details Name: Marco [name] MRN: [id number] DOB: 01/27/1956 Today's Date: 12/12/2019    History of Present Illness 64 y.o. male with past medical history of systolic and diastolic congestive heart failure, nonischemic cardiomyopathy, chronic kidney disease stage III, diabetes mellitus type 2, coronary artery disease, hypertension, obesity, obstructive sleep apnea, atrial fibrillation status post ablation 2017, psoriatic arthritis. Patient presented secondary to confusion and lethargy with concern for sepsis on admission.   Clinical Impression   Pt in bed upon arrival; agreeable to OT eval. Pt is at sup - Mod I level with LB ADLs, mod A to don socks. Pt reports that donning socsk and pants required increased time and effort PTA due to rotund mid section. Eduacted pt on use of LH bath sponge and sock aid for home use. Pt lives at home with his wife and was independent with ADLs/selfcare and ADL mobility using no AD. All education completed and no further acute OT is indicated at this time    Follow Up Recommendations  No OT follow up    Equipment Recommendations  Other (comment)(LH bath sponge, sock aid)    Recommendations for Other Services       Precautions / Restrictions Precautions Precautions: Fall Restrictions Weight Bearing Restrictions: No      Mobility Bed Mobility Overal bed mobility: Needs Assistance Bed Mobility: Supine to Sit     Supine to sit: Supervision;HOB elevated     General bed mobility comments: pt sitting EOB with RN giving meds at end of session  Transfers Overall transfer level: Needs assistance Equipment used: Rolling walker (2 wheeled) Transfers: Sit to/from Stand Sit to Stand: Min guard;Supervision         General transfer comment: incr time, assist to rise and steady    Balance Overall balance assessment: Needs assistance Sitting-balance support: No upper extremity supported;Feet supported Sitting  balance-Leahy Scale: Fair     Standing balance support: Bilateral upper extremity supported;Single extremity supported;During functional activity Standing balance-Leahy Scale: Fair                             ADL either performed or assessed with clinical judgement   ADL Overall ADL's : At baseline                                       General ADL Comments: sup with LB ADLs, mod A to don socks. Pt reports that donning socsk and pants required increased time and effort PTA due to rotund mid section. Eduacted pt on use of LH bath sponge and sock aid for home use     Vision Patient Visual Report: No change from baseline       Perception     Praxis      Pertinent Vitals/Pain Pain Assessment: 0-10 Pain Score: 4  Pain Location: generalized Pain Descriptors / Indicators: Discomfort;Sore Pain Intervention(s): Limited activity within patient's tolerance;Monitored during session;Repositioned     Hand Dominance Right   Extremity/Trunk Assessment Upper Extremity Assessment Upper Extremity Assessment: Overall WFL for tasks assessed   Lower Extremity Assessment Lower Extremity Assessment: Defer to PT evaluation   Cervical / Trunk Assessment Cervical / Trunk Assessment: Normal   Communication Communication Communication: No difficulties   Cognition Arousal/Alertness: Awake/alert Behavior During Therapy: WFL for tasks assessed/performed Overall Cognitive Status: Within Functional Limits for tasks assessed  General Comments       Exercises     Shoulder Instructions      Home Living Family/patient expects to be discharged to:: Private residence Living Arrangements: Spouse/significant other Available Help at Discharge: Family Type of Home: House Home Access: Level entry     Home Layout: One level     Bathroom Shower/Tub: Tub/shower unit;Walk-in shower   Bathroom Toilet: Standard      Home Equipment: None          Prior Functioning/Environment Level of Independence: Independent                 OT Problem List: Decreased activity tolerance      OT Treatment/Interventions:      OT Goals(Current goals can be found in the care plan section) Acute Rehab OT Goals Patient Stated Goal: go home OT Goal Formulation: With patient  OT Frequency:     Barriers to D/C:            Co-evaluation              AM-PAC OT "6 Clicks" Daily Activity     Outcome Measure Help from another person eating meals?: None Help from another person taking care of personal grooming?: None Help from another person toileting, which includes using toliet, bedpan, or urinal?: None Help from another person bathing (including washing, rinsing, drying)?: A Little Help from another person to put on and taking off regular upper body clothing?: None Help from another person to put on and taking off regular lower body clothing?: A Little 6 Click Score: 22   End of Session Equipment Utilized During Treatment: Gait belt;Rolling walker  Activity Tolerance: Patient tolerated treatment well Patient left: in bed;with nursing/sitter in room;Other (comment)(sitting EOB getting meds)  OT Visit Diagnosis: Other abnormalities of gait and mobility (R26.89)                Time: [id number] OT Time Calculation (min): 30 min Charges:  OT General Charges $OT Visit: 1 Visit OT Evaluation $OT Eval Low Complexity: 1 Low OT Treatments $Self Care/Home Management : 8-22 mins   Britt Bottom 12/12/2019, 2:11 PM

## 2019-12-12 NOTE — Progress Notes (Signed)
PROGRESS NOTE    Marco [name]  [id number] DOB: [birth year] DOA: 12/08/2019 PCP: [name], MD   Brief Narrative: Marco [name] is a 64 y.o. male with past medical history of systolic and diastolic congestive heart failure, nonischemic cardiomyopathy, chronic kidney disease stage III, diabetes mellitus type 2, coronary artery disease, hypertension, obesity, obstructive sleep apnea, atrial fibrillation status post ablation 2017, psoriatic arthritis. Patient presented secondary to confusion and lethargy with concern for sepsis on admission. Started on empiric Vancomycin and Cefepime. Blood and urine cultures pending.   Assessment & Plan:   Principal Problem:   Sepsis due to Gram negative bacteria (HCC) Active Problems:   Type 2 diabetes mellitus with stage 3 chronic kidney disease, without long-term current use of insulin (HCC)   Diabetic polyneuropathy associated with type 2 diabetes mellitus (HCC)   Chronic combined systolic and diastolic CHF (congestive heart failure) (HCC)   Acute renal failure superimposed on stage 3 chronic kidney disease (HCC)   Acute metabolic encephalopathy   Hyperkalemia   Severe sepsis (HCC)   Severe sepsis Present on admission. Likely UTI source. Associated AKI and hypotension requiring 30 cc/kg NS bolus. Blood and urine cultures obtained on admission. Vancomycin and Cefepime empirically started. CT abdomen suggests likely right kidney infection. Urine culture significant for E. Coli. Slightly elevated temperature last night with persistently elevated WBC. -Continue Ceftriaxone -CBC in AM; trend fever curve  AKI on CKD stage III Baseline creatinine of 1.5. Creatinine on admission of 5.61. Trended down with IV fluids. Stabilized today. Urine output low in last 24 hours but two unmeasured occurrences documented. -Strict in/out  Acute metabolic encephalopathy In setting of sepsis and infection. Improving slowly. CT head negative for acute  process  Hypoglycemia In setting of poor oral intake. Started on D5 IV fluids. Resolved.  Diabetes mellitus, type 2 Patient is on Humalog 75/25 150 units daily. Hypoglycemia during admission. -SSI qAC and HS -Continue Lantus 10 units daily  Hyperkalemia Resolved.  Depression On Cymbalta as an outpatient  Chronic combined systolic and diastolic heart failure Last Transthoracic Echocardiogram from 10/10/2019 significant for normal EF of 55-60%. On Coreg, Entresto, Lasix as an outpatient which were held on admission secondary to sepsis. Weight is up 13 lbs from admission -Watch fluid status -Daily weights; recheck weight this morning  (standing)  Essential hypertension Patient is on Coreg, Imdur, hydralazine, Entresto, lasix as an outpatient. Held secondary to sepsis and hypotension on admission. Blood pressure controlled. -Continue Coreg and Imdur  Neuropathy On Lyrica as an outpatient. -Continue Lyrica  Psoriatic arthritis On prednisone as an outpatient. Started on stress dose steroids on admission for hypotension. -Continue home prednisone 5 mg daily -Continue home oxycodone  Abdominal tenderness Normal bowel sounds. Unremarkable abdominal x-ray. Resolved.  Chest pain Atypical. Reproducible. Obtained EKG which was unremarkable for ACS concern. Blood pressure elevated. Resolved.   DVT prophylaxis: Heparin subq Code Status:   Code Status: Full Code Family Communication: None at bedside Disposition Plan: Discharge home possibly in 24 hours pending continued improvement in WBC, creatinine and fever curve.   Consultants:   None  Procedures:   None  Antimicrobials:  Vancomycin  Cefepime   Flagyl  Ceftriaxone   Subjective: No issues overnight. Bowel movement yesterday.  Objective: Vitals:   12/11/19 1807 12/11/19 2014 12/12/19 0500 12/12/19 0619  BP: 130/67 121/67  131/66  Pulse: 62 63  66  Resp:  18  17  Temp:  (!) 97.5 F (36.4 C)  99.9 F (37.7  C)  TempSrc:  Oral  Oral  SpO2:  95%  92%  Weight:   115 kg   Height:        Intake/Output Summary (Last 24 hours) at 12/12/2019 0924 Last data filed at 12/12/2019 0859 Gross per 24 hour  Intake 420 ml  Output 1150 ml  Net -730 ml   Filed Weights   12/08/19 2020 12/11/19 0411 12/12/19 0500  Weight: 108.9 kg 110.3 kg 115 kg    Examination:  General exam: Appears calm and comfortable Respiratory system: Clear to auscultation. Respiratory effort normal. Cardiovascular system: S1 & S2 heard, RRR. No murmurs, rubs, gallops or clicks. Gastrointestinal system: Abdomen is distended, soft and nontender. No organomegaly or masses felt. Normal bowel sounds heard. Central nervous system: Alert and oriented. No focal neurological deficits. Extremities: Mild edema in hand and LE. No calf tenderness Skin: No cyanosis. No rashes Psychiatry: Judgement and insight appear normal. Mood & affect appropriate.       Data Reviewed: I have personally reviewed following labs and imaging studies  CBC: Recent Labs  Lab 12/08/19 2014 12/10/19 0500 12/11/19 0428 12/12/19 0754  WBC 17.9* 20.4* 15.9* 14.3*  NEUTROABS 14.6* 18.5*  --   --   HGB 10.8* 10.6* 10.8* 10.6*  HCT 34.6* 34.5* 34.3* 33.9*  MCV 94.8 94.3 94.5 95.8  PLT 270 259 260 [id number]   Basic Metabolic Panel: Recent Labs  Lab 12/09/19 0210 12/09/19 0210 12/09/19 1756 12/10/19 0500 12/11/19 0428 12/12/19 0500 12/12/19 0754  NA 141  --  141 139 142  --  142  K 4.8  --  5.6* 4.9 4.1  --  4.3  CL 109  --  112* 111 116*  --  114*  CO2 20*  --  19* 20* 21*  --  22  GLUCOSE 55*  --  204* 249* 136*  --  96  BUN 68*  --  68* 65* 53*  --  42*  CREATININE 5.22*   < > 4.10* 3.57* 2.66* 2.24* 2.27*  CALCIUM 7.7*  --  7.6* 7.9* 7.8*  --  7.8*  MG  --   --   --  2.5*  --   --   --   PHOS 5.2*  --   --   --   --   --   --    < > = values in this interval not displayed.   GFR: Estimated Creatinine Clearance: 39.2 mL/min (A) (by C-G formula  based on SCr of 2.27 mg/dL (H)). Liver Function Tests: Recent Labs  Lab 12/08/19 2014 12/09/19 0210 12/10/19 0500  [name] 24  --  31  ALT 20  --  26  ALKPHOS 45  --  49  BILITOT 0.8  --  0.9  PROT 7.5  --  6.7  ALBUMIN 2.7* 2.3* 2.2*   No results for input(s): LIPASE, AMYLASE in the last 168 hours. Recent Labs  Lab 12/09/19 0210  AMMONIA 26   Coagulation Profile: Recent Labs  Lab 12/08/19 2014  INR 1.3*   Cardiac Enzymes: No results for input(s): CKTOTAL, CKMB, CKMBINDEX, TROPONINI in the last 168 hours. BNP (last 3 results) No results for input(s): PROBNP in the last 8760 hours. HbA1C: No results for input(s): HGBA1C in the last 72 hours. CBG: Recent Labs  Lab 12/11/19 0733 12/11/19 1123 12/11/19 1706 12/11/19 2016 12/12/19 0733  GLUCAP 103* 161* 193* 197* 129*   Lipid Profile: No results for input(s): CHOL, HDL, LDLCALC, TRIG, CHOLHDL, LDLDIRECT in the last  72 hours. Thyroid Function Tests: No results for input(s): TSH, T4TOTAL, FREET4, T3FREE, THYROIDAB in the last 72 hours. Anemia Panel: No results for input(s): VITAMINB12, FOLATE, FERRITIN, TIBC, IRON, RETICCTPCT in the last 72 hours. Sepsis Labs: Recent Labs  Lab 12/08/19 2014 12/09/19 1200 12/10/19 0500 12/11/19 0428  PROCALCITON  --  23.81 11.85 5.86  LATICACIDVEN 1.4  --   --   --     Recent Results (from the past 240 hour(s))  Blood Culture (routine x 2)     Status: None (Preliminary result)   Collection Time: 12/08/19  8:14 PM   Specimen: BLOOD LEFT FOREARM  Result Value Ref Range Status   Specimen Description BLOOD LEFT FOREARM  Final   Special Requests   Final    BOTTLES DRAWN AEROBIC ONLY Blood Culture results may not be optimal due to an inadequate volume of blood received in culture bottles Performed at [facility], [place] [address], [place], [place] [address]    Culture   Final    NO GROWTH 4 DAYS Performed at [facility], [place] [address], [place],  [place] [address]    Report Status PENDING  Incomplete  Urine culture     Status: Abnormal   Collection Time: 12/08/19  8:14 PM   Specimen: In/Out Cath Urine  Result Value Ref Range Status   Specimen Description   Final    IN/OUT CATH URINE Performed at [facility] [address], [place], [place] [address]    Special Requests   Final    NONE Performed at [facility], [place] [address], [place], [place] [address]    Culture >=100,000 COLONIES/mL ESCHERICHIA COLI (A)  Final   Report Status 12/10/2019 FINAL  Final   Organism ID, Bacteria ESCHERICHIA COLI (A)  Final      Susceptibility   Escherichia coli - MIC*    AMPICILLIN >=32 RESISTANT Resistant     CEFAZOLIN >=64 RESISTANT Resistant     CEFTRIAXONE <=1 SENSITIVE Sensitive     CIPROFLOXACIN <=0.25 SENSITIVE Sensitive     GENTAMICIN <=1 SENSITIVE Sensitive     IMIPENEM <=0.25 SENSITIVE Sensitive     NITROFURANTOIN <=16 SENSITIVE Sensitive     TRIMETH/SULFA <=20 SENSITIVE Sensitive     AMPICILLIN/SULBACTAM >=32 RESISTANT Resistant     PIP/TAZO 8 SENSITIVE Sensitive     * >=100,000 COLONIES/mL ESCHERICHIA COLI  Blood Culture (routine x 2)     Status: None (Preliminary result)   Collection Time: 12/08/19  8:45 PM   Specimen: BLOOD  Result Value Ref Range Status   Specimen Description   Final    BLOOD RIGHT HAND Performed at [facility] [address], [place], [place] [address]    Special Requests   Final    BOTTLES DRAWN AEROBIC AND ANAEROBIC Blood Culture results may not be optimal due to an excessive volume of blood received in culture bottles Performed at [facility] [name], [place], [place] [address]    Culture   Final    NO GROWTH 4 DAYS Performed at [facility], [place] [address], [place], [place] [address]    Report Status PENDING  Incomplete  Respiratory Panel by RT PCR (Flu A&B, Covid) - Nasopharyngeal Swab     Status: None    Collection Time: 12/08/19 10:00 PM   Specimen: Nasopharyngeal Swab  Result Value Ref Range Status   SARS Coronavirus 2 by RT PCR NEGATIVE NEGATIVE  Final    Comment: (NOTE) SARS-CoV-2 target nucleic acids are NOT DETECTED. The SARS-CoV-2 RNA is generally detectable in upper respiratoy specimens during the acute phase of infection. The lowest concentration of SARS-CoV-2 viral copies this assay can detect is 131 copies/mL. A negative result does not preclude SARS-Cov-2 infection and should not be used as the sole basis for treatment or other patient management decisions. A negative result may occur with  improper specimen collection/handling, submission of specimen other than nasopharyngeal swab, presence of viral mutation(s) within the areas targeted by this assay, and inadequate number of viral copies (<131 copies/mL). A negative result must be combined with clinical observations, patient history, and epidemiological information. The expected result is Negative. Fact Sheet for Patients:  PinkCheek.be Fact Sheet for Healthcare Providers:  GravelBags.it This test is not yet ap proved or cleared by the [place] FDA and  has been authorized for detection and/or diagnosis of SARS-CoV-2 by FDA under an Emergency Use Authorization (EUA). This EUA will remain  in effect (meaning this test can be used) for the duration of the COVID-19 declaration under Section 564(b)(1) of the Act, 21 U.S.C. section 360bbb-3(b)(1), unless the authorization is terminated or revoked sooner.    Influenza A by PCR NEGATIVE NEGATIVE Final   Influenza B by PCR NEGATIVE NEGATIVE Final    Comment: (NOTE) The Xpert Xpress SARS-CoV-2/FLU/RSV assay is intended as an aid in  the diagnosis of influenza from Nasopharyngeal swab specimens and  should not be used as a sole basis for treatment. Nasal washings and  aspirates are unacceptable for Xpert Xpress  SARS-CoV-2/FLU/RSV  testing. Fact Sheet for Patients: PinkCheek.be Fact Sheet for Healthcare Providers: GravelBags.it This test is not yet approved or cleared by the [place] FDA and  has been authorized for detection and/or diagnosis of SARS-CoV-2 by  FDA under an Emergency Use Authorization (EUA). This EUA will remain  in effect (meaning this test can be used) for the duration of the  Covid-19 declaration under Section 564(b)(1) of the Act, 21  U.S.C. section 360bbb-3(b)(1), unless the authorization is  terminated or revoked. Performed at [facility], [place] [address], [place], [place] [address]          Radiology Studies: DG Abd Portable 1V  Result Date: 12/10/2019 CLINICAL DATA:  Lower abdominal pain. Lethargy. EXAM: PORTABLE ABDOMEN - 1 VIEW COMPARISON:  12/09/2019 CT abdomen/pelvis FINDINGS: No disproportionately dilated small bowel loops. Mild colonic gas and stool. No evidence of pneumatosis or pneumoperitoneum. There is a 3 mm left mid renal stone. Mild lumbar spondylosis. Calcified venous phleboliths in the deep pelvis. IMPRESSION: 1. Nonobstructive bowel gas pattern. 2. Left nephrolithiasis. Electronically Signed   By: Ilona Sorrel M.D.   On: 12/10/2019 10:20        Scheduled Meds: . aspirin  81 mg Oral QHS  . atorvastatin  40 mg Oral Daily  . carvedilol  12.5 mg Oral BID WC  . heparin  5,000 Units Subcutaneous Q8H  . insulin aspart  0-20 Units Subcutaneous TID WC  . insulin glargine  10 Units Subcutaneous Daily  . isosorbide mononitrate  30 mg Oral Daily  . oxyCODONE  5 mg Oral Q6H  . pantoprazole  40 mg Oral Daily  . predniSONE  5 mg Oral Q breakfast  . pregabalin  100 mg Oral TID   Continuous Infusions: . cefTRIAXone (ROCEPHIN)  IV 2 g (12/12/19 0756)     LOS: 3 days     Cordelia Poche, MD [facility] 12/12/2019, 9:24 AM  If 7PM-7AM, please contact  night-coverage [email or web address]

## 2019-12-13 LAB — CULTURE, BLOOD (ROUTINE X 2)
Culture: NO GROWTH
Culture: NO GROWTH

## 2019-12-13 LAB — BASIC METABOLIC PANEL
Anion gap: 6 (ref 5–15)
BUN: 29 mg/dL — ABNORMAL HIGH (ref 8–23)
CO2: 23 mmol/L (ref 22–32)
Calcium: 8 mg/dL — ABNORMAL LOW (ref 8.9–10.3)
Chloride: 111 mmol/L (ref 98–111)
Creatinine, Ser: 1.67 mg/dL — ABNORMAL HIGH (ref 0.61–1.24)
GFR calc Af Amer: 49 mL/min — ABNORMAL LOW (ref >60)
GFR calc non Af Amer: 43 mL/min — ABNORMAL LOW (ref >60)
Glucose, Bld: 124 mg/dL — ABNORMAL HIGH (ref 70–99)
Potassium: 4.2 mmol/L (ref 3.5–5.1)
Sodium: 140 mmol/L (ref 135–145)

## 2019-12-13 LAB — CBC
HCT: 32.9 % — ABNORMAL LOW (ref 39.0–52.0)
Hemoglobin: 10.2 g/dL — ABNORMAL LOW (ref 13.0–17.0)
MCH: 29.7 pg (ref 26.0–34.0)
MCHC: 31 g/dL (ref 30.0–36.0)
MCV: 95.6 fL (ref 80.0–100.0)
Platelets: 254 10*3/uL (ref 150–400)
RBC: 3.44 MIL/uL — ABNORMAL LOW (ref 4.22–5.81)
RDW: 15.8 % — ABNORMAL HIGH (ref 11.5–15.5)
WBC: 14.5 10*3/uL — ABNORMAL HIGH (ref 4.0–10.5)
nRBC: 0 % (ref 0.0–0.2)

## 2019-12-13 LAB — GLUCOSE, CAPILLARY
Glucose-Capillary: 118 mg/dL — ABNORMAL HIGH (ref 70–99)
Glucose-Capillary: 257 mg/dL — ABNORMAL HIGH (ref 70–99)

## 2019-12-13 MED ORDER — CEFDINIR 300 MG PO CAPS: 300.0000 mg | ORAL_CAPSULE | Freq: Two times a day (BID) | ORAL | Status: DC

## 2019-12-13 MED ORDER — CEFDINIR 300 MG PO CAPS: 300.0000 mg | ORAL_CAPSULE | Freq: Two times a day (BID) | ORAL | 0 refills | Status: AC

## 2019-12-13 MED FILL — CEFDINIR 300 MG CAPSULE: 300 | 6 days supply | Qty: 12 | Fill #0

## 2019-12-13 NOTE — Discharge Instructions (Signed)
Marco [name],  You were in the hospital because of a urine and kidney infection. This was treated with antibiotics and you have improved. You will be discharged on antibiotics to complete a 10 day course. Please follow-up with your primary care physician. You had some kidney insufficiency that improved with IV fluids. Please resume your home medications but have your blood work rechecked in 3-5 days.

## 2019-12-13 NOTE — Discharge Summary (Signed)
Physician Discharge Summary  Marco [name] [id number] DOB: Apr 22, 1956 DOA: 12/08/2019  PCP: [name], MD  Admit date: 12/08/2019 Discharge date: 12/13/2019  Admitted From: Home Disposition: Home  Recommendations for Outpatient Follow-up:  1. Follow up with PCP in 1 week 2. Please obtain BMP/CBC in one week 3. Please follow up on the following pending results: None  Home Health: None Equipment/Devices: None  Discharge Condition: Stable CODE STATUS: Full code Diet recommendation: Heart healthy   Brief/Interim Summary:  Admission HPI written by Mariel Aloe, MD   HPI:    64 year old male with past medical history of systolic and diastolic congestive heart failure, nonischemic cardiomyopathy, chronic kidney disease stage III, diabetes mellitus type 2, coronary artery disease, hypertension, obesity, obstructive sleep apnea, atrial fibrillation status post ablation 2017, psoriatic arthritis on daily prednisone therapy who presents to [facility] emergency department with a several day history of rapidly progressive somnolence.  Patient is an extremely poor historian and minimally responsive and therefore the majority the history is been obtained from the wife.  The wife explains that last week for approximately 2 days the patient was experiencing watery diarrhea.  This diarrhea seemed to resolve on its own.  Then, this past Saturday the patient began to seem more and more sleepy.  This sleepiness and weakness rapidly progressed over the next several days, to the point where the patient was trembling and could not even walk.  Is continue to worsen until on 4/29 the patient was minimally responsive.  The patient was not taking any p.o. intake.  Patient did not complain of any shortness of breath, cough, dysuria or abdominal pain.  Patient did not exhibit any fevers.  Patient has not undergone any recent travel has, no contact with anyone with confirmed  COVID-19.  Patient was eventually brought into [facility] emergency department via EMS for evaluation.  Upon evaluation in the emergency department patient was found to be hypotensive with substantial leukocytosis concerning for sepsis and impending septic shock.  Patient was initiated on broad-spectrum intravenous antibiotic therapy with vancomycin and Zosyn.  Patient was hydrated with 30 cc/kg of isotonic fluids.  Chest x-ray revealed no evidence of definitive pneumonia.  Urinalysis was abnormal with notable leukocyte esterase and greater than 50 white blood cells per high-powered field.  Hospitalist group was then called to assess the patient for admission the hospital.    Hospital course:  Severe sepsis Pyelonephritis Present on admission. Likely UTI source. Associated AKI and hypotension requiring 30 cc/kg NS bolus. Blood and urine cultures obtained on admission. Vancomycin and Cefepime empirically started. CT abdomen suggests likely right kidney infection. Urine culture significant for E. Coli. patient transitioned to Ceftriaxone and finally Cefdinir for discharge.  AKI on CKD stage IIIb Baseline creatinine of 1.5. Creatinine on admission of 5.61. Trended down with IV fluids. At baseline at time of discharge. Recheck BMP in setting of restarting home heart failure medications.  Acute metabolic encephalopathy In setting of sepsis and infection. Improving slowly. CT head negative for acute process  Hypoglycemia In setting of poor oral intake. Started on D5 IV fluids. Resolved.  Diabetes mellitus, type 2 Patient is on Humalog 75/25 150 units daily. Hypoglycemia during admission in setting of acute infection. Resume home regimen.  Hyperkalemia Resolved.  Depression On Cymbalta as an outpatient  Chronic combined systolic and diastolic heart failure Last Transthoracic Echocardiogram from 10/10/2019 significant for normal EF of 55-60%. On Coreg, Entresto, Lasix as an outpatient  which were held  on admission secondary to sepsis. Weight on discharge of 246.7 lbs (111.9 kg). Resume home regimen.  Essential hypertension Patient is on Coreg, Imdur, hydralazine, Entresto, lasix as an outpatient. Held secondary to sepsis and hypotension on admission. Blood pressure controlled. Continue Coreg and Imdur  Neuropathy On Lyrica as an outpatient. Continue Lyrica  Psoriatic arthritis On prednisone as an outpatient. Started on stress dose steroids on admission for hypotension. Continue home prednisone 5 mg daily and home oxycodone  Abdominal tenderness Normal bowel sounds. Unremarkable abdominal x-ray. Resolved.  Chest pain Atypical. Reproducible. Obtained EKG which was unremarkable for ACS concern. Blood pressure elevated. Resolved.  Discharge Diagnoses:  Principal Problem:   Sepsis due to Gram negative bacteria ([place]) Active Problems:   Type 2 diabetes mellitus with stage 3 chronic kidney disease, without long-term current use of insulin (HCC)   Diabetic polyneuropathy associated with type 2 diabetes mellitus (HCC)   Chronic combined systolic and diastolic CHF (congestive heart failure) (HCC)   Acute renal failure superimposed on stage 3 chronic kidney disease ([place])   Acute metabolic encephalopathy   Hyperkalemia   Severe sepsis [facility])    Discharge Instructions  Discharge Instructions    Increase activity slowly   Complete by: As directed      Allergies as of 12/13/2019      Reactions   Nsaids Other (See Comments)   Stomach pains. Ulcers - stated by patient       Medication List    STOP taking these medications   doxycycline 100 MG capsule Commonly known as: VIBRAMYCIN   Potassium Chloride ER 20 MEQ Tbcr     TAKE these medications   Accu-Chek FastClix Lancets Misc Use one strip to monitor glucose levels BID; E11.42   albuterol 108 (90 Base) MCG/ACT inhaler Commonly known as: VENTOLIN HFA Inhale 2 puffs into the lungs every 6 (six) hours as  needed for wheezing or shortness of breath.   allopurinol 100 MG tablet Commonly known as: ZYLOPRIM Take 200 mg by mouth every evening.   atorvastatin 40 MG tablet Commonly known as: LIPITOR Take 1 tablet (40 mg total) by mouth daily.   carvedilol 12.5 MG tablet Commonly known as: COREG Take 1 tablet (12.5 mg total) by mouth 2 (two) times daily with a meal.   cefdinir 300 MG capsule Commonly known as: OMNICEF Take 1 capsule (300 mg total) by mouth every 12 (twelve) hours for 6 days. Start taking on: Dec 14, 2019   Cosentyx 150 MG/ML Sosy Generic drug: Secukinumab Inject 150 mg into the skin every 28 (twenty-eight) days.   diclofenac Sodium 1 % Gel Commonly known as: Voltaren Apply 4 g topically 4 (four) times daily.   DULoxetine 20 MG capsule Commonly known as: CYMBALTA Take 1 capsule by mouth once daily   Entresto 97-103 MG Generic drug: sacubitril-valsartan Take 1 tablet by mouth 2 (two) times daily.   EQ Aspirin Adult Low Dose 81 MG EC tablet Generic drug: aspirin TAKE 1 TABLET BY MOUTH ONCE DAILY. SWALLOW WHOLE What changed: See the new instructions.   famotidine 20 MG tablet Commonly known as: PEPCID Take 20 mg by mouth 2 (two) times daily.   fluticasone 50 MCG/ACT nasal spray Commonly known as: FLONASE Place 2 sprays into both nostrils daily.   furosemide 40 MG tablet Commonly known as: LASIX Take 1 tablet (40 mg total) by mouth 2 (two) times daily. What changed: Another medication with the same name was removed. Continue taking this medication, and follow the directions you  see here.   glucose blood test strip Commonly known as: Accu-Chek Guide Use one strip to monitor glucose levels BID; E11.42   hydrALAZINE 50 MG tablet Commonly known as: APRESOLINE TAKE 1 TABLET BY MOUTH EVERY 8 HOURS   Insulin Lispro Prot & Lispro (75-25) 100 UNIT/ML Kwikpen Commonly known as: HumaLOG Mix 75/25 KwikPen Inject 150 Units into the skin daily with breakfast. What  changed: how much to take   isosorbide mononitrate 30 MG 24 hr tablet Commonly known as: IMDUR Take 1 tablet (30 mg total) by mouth daily.   methocarbamol 750 MG tablet Commonly known as: ROBAXIN Take 750 mg by mouth every 12 (twelve) hours.   oxyCODONE 5 MG immediate release tablet Commonly known as: Oxy IR/ROXICODONE Take 5 mg by mouth every 6 (six) hours.   Pen Needles 31G X 6 MM Misc 1 each by Does not apply route daily. E11.9   potassium chloride SA 20 MEQ tablet Commonly known as: KLOR-CON Take 20 mEq by mouth daily.   predniSONE 5 MG tablet Commonly known as: DELTASONE Take 5 mg by mouth daily.   pregabalin 100 MG capsule Commonly known as: LYRICA Take 100 mg by mouth 3 (three) times daily.   Vyndamax 61 MG Caps Generic drug: Tafamidis Take 61 mg by mouth daily.   zolpidem 6.25 MG CR tablet Commonly known as: AMBIEN CR TAKE 1 TABLET BY MOUTH EVERY DAY AT BEDTIME AS NEEDED FOR SLEEP What changed: See the new instructions.      Follow-up Information    [name], MD. Schedule an appointment as soon as possible for a visit in 1 week(s).   Specialty: Internal Medicine Why: Hospital followup Contact information: [place] [address] [phone]        Bensimhon, Shaune Pascal, MD .   Specialty: Cardiology Contact information: [place] [address] [phone]          Allergies  Allergen Reactions  . Nsaids Other (See Comments)    Stomach pains. Ulcers - stated by patient     Consultations:  None   Procedures/Studies: CT ABDOMEN PELVIS WO CONTRAST  Result Date: 12/09/2019 CLINICAL DATA:  Sepsis, abdominal pain tenderness severe acute kidney injury EXAM: CT ABDOMEN AND PELVIS WITHOUT CONTRAST TECHNIQUE: Multidetector CT imaging of the abdomen and pelvis was performed following the standard protocol without IV contrast. COMPARISON:  MRI abdomen August 18, 2019 FINDINGS: Lower chest:  There is moderate cardiomegaly. Trace pericardial effusion/thickening is seen. Streaky atelectasis seen at both lung bases. There is a small hiatal hernia. Hepatobiliary: The liver is normal in density without focal abnormality.The main portal vein is patent. No evidence of calcified gallstones, gallbladder wall thickening or biliary dilatation. Pancreas: Unremarkable. No pancreatic ductal dilatation or surrounding inflammatory changes. Spleen: Normal in size without focal abnormality. Adrenals/Urinary Tract: Both adrenal glands appear normal. Again noted are several partially exophytic lesions within both kidneys. The largest which is slightly hyperdense within the upper pole of the right kidney measuring 3.8 cm most recently found to be a renal cyst. There scattered bilateral renal calculi present the largest measuring 6 mm in the lower pole of the left kidney. There is mild to moderate right-sided perinephric stranding, predominantly around the lower pole. No hydronephrosis however is seen. No ureteral or bladder calculi are noted. Stomach/Bowel: The stomach, small bowel, and colon are normal in appearance. No inflammatory changes, wall thickening, or obstructive findings.Scattered colonic diverticula are noted without diverticulitis. Vascular/Lymphatic: There  are no enlarged mesenteric, retroperitoneal, or pelvic lymph nodes. Scattered aortic atherosclerotic calcifications are seen without aneurysmal dilatation. Reproductive: The prostate is unremarkable. Other: Bilateral fat containing inguinal hernias are present. Musculoskeletal: No acute or significant osseous findings. IMPRESSION: Mild-to-moderate right-sided perinephric fat stranding changes which could be due to infectious /inflammatory process or recently passed stone. Bilateral nonobstructing renal calculi. Diverticulosis without diverticulitis. Aortic Atherosclerosis (ICD10-I70.0). Electronically Signed   By: Prudencio Pair M.D.   On: 12/09/2019 03:34    CT HEAD WO CONTRAST  Result Date: 12/09/2019 CLINICAL DATA:  Altered mental status. EXAM: CT HEAD WITHOUT CONTRAST TECHNIQUE: Contiguous axial images were obtained from the base of the skull through the vertex without intravenous contrast. COMPARISON:  May 17, 2019 FINDINGS: Brain: No evidence of acute infarction, hemorrhage, hydrocephalus, extra-axial collection or mass lesion/mass effect. Vascular: No hyperdense vessel or unexpected calcification. Skull: Normal. Negative for fracture or focal lesion. Sinuses/Orbits: There is stable marked severity left maxillary sinus, left ethmoid sinus and sphenoid sinus mucosal thickening. Stable hyperdense secretions are again seen. A chronic deformity of the medial wall of the left maxillary sinus is also seen. Other: None. IMPRESSION: 1. No acute intracranial abnormality. 2. Stable left maxillary sinus, left ethmoid sinus and sphenoid sinus disease. Electronically Signed   By: Virgina Norfolk M.D.   On: 12/09/2019 03:16   DG Chest Port 1 View  Result Date: 12/08/2019 CLINICAL DATA:  Altered mental status, minimally responsive since Sunday. EXAM: PORTABLE CHEST 1 VIEW COMPARISON:  September 05, 2018 FINDINGS: There is no evidence of acute infiltrate, pleural effusion or pneumothorax. The heart size and mediastinal contours are within normal limits. Mild to moderate severity degenerative changes are again seen within the mid to lower thoracic spine. IMPRESSION: No active disease. Electronically Signed   By: Virgina Norfolk M.D.   On: 12/08/2019 21:22   DG Abd Portable 1V  Result Date: 12/10/2019 CLINICAL DATA:  Lower abdominal pain. Lethargy. EXAM: PORTABLE ABDOMEN - 1 VIEW COMPARISON:  12/09/2019 CT abdomen/pelvis FINDINGS: No disproportionately dilated small bowel loops. Mild colonic gas and stool. No evidence of pneumatosis or pneumoperitoneum. There is a 3 mm left mid renal stone. Mild lumbar spondylosis. Calcified venous phleboliths in the deep pelvis.  IMPRESSION: 1. Nonobstructive bowel gas pattern. 2. Left nephrolithiasis. Electronically Signed   By: Ilona Sorrel M.D.   On: 12/10/2019 10:20      Subjective: No concerns today.  Discharge Exam: Vitals:   12/12/19 2102 12/13/19 0542  BP: 128/74 133/66  Pulse: (!) 58 70  Resp: 17   Temp: (!) 97.5 F (36.4 C) 99.1 F (37.3 C)  SpO2: 96% 93%   Vitals:   12/12/19 1031 12/12/19 1125 12/12/19 2102 12/13/19 0542  BP:  118/66 128/74 133/66  Pulse:  68 (!) 58 70  Resp:  16 17   Temp:  99.3 F (37.4 C) (!) 97.5 F (36.4 C) 99.1 F (37.3 C)  TempSrc:  Oral Oral Oral  SpO2:  96% 96% 93%  Weight: 112.2 kg   111.9 kg  Height:        General: Pt is alert, awake, not in acute distress Cardiovascular: RRR, S1/S2 +, no rubs, no gallops Respiratory: CTA bilaterally, no wheezing, no rhonchi Abdominal: Soft, NT, ND, bowel sounds + Extremities: no cyanosis    The results of significant diagnostics from this hospitalization (including imaging, microbiology, ancillary and laboratory) are listed below for reference.     Microbiology: Recent Results (from the past 240 hour(s))  Blood Culture (routine  x 2)     Status: None   Collection Time: 12/08/19  8:14 PM   Specimen: BLOOD LEFT FOREARM  Result Value Ref Range Status   Specimen Description BLOOD LEFT FOREARM  Final   Special Requests   Final    BOTTLES DRAWN AEROBIC ONLY Blood Culture results may not be optimal due to an inadequate volume of blood received in culture bottles Performed at [facility], [place] [address] [place], [place] [address]    Culture   Final    NO GROWTH 5 DAYS Performed at [facility], [place] [address] [place], [place] [address]    Report Status 12/13/2019 FINAL  Final  Urine culture     Status: Abnormal   Collection Time: 12/08/19  8:14 PM   Specimen: In/Out Cath Urine  Result Value Ref Range Status   Specimen Description   Final    IN/OUT CATH URINE Performed at [place] [address] [place], [place] [address]    Special Requests   Final    NONE Performed at [facility], [place] [address] [place], [place] [address]    Culture >=100,000 COLONIES/mL ESCHERICHIA COLI (A)  Final   Report Status 12/10/2019 FINAL  Final   Organism ID, Bacteria ESCHERICHIA COLI (A)  Final      Susceptibility   Escherichia coli - MIC*    AMPICILLIN >=32 RESISTANT Resistant     CEFAZOLIN >=64 RESISTANT Resistant     CEFTRIAXONE <=1 SENSITIVE Sensitive     CIPROFLOXACIN <=0.25 SENSITIVE Sensitive     GENTAMICIN <=1 SENSITIVE Sensitive     IMIPENEM <=0.25 SENSITIVE Sensitive     NITROFURANTOIN <=16 SENSITIVE Sensitive     TRIMETH/SULFA <=20 SENSITIVE Sensitive     AMPICILLIN/SULBACTAM >=32 RESISTANT Resistant     PIP/TAZO 8 SENSITIVE Sensitive     * >=100,000 COLONIES/mL ESCHERICHIA COLI  Blood Culture (routine x 2)     Status: None   Collection Time: 12/08/19  8:45 PM   Specimen: BLOOD  Result Value Ref Range Status   Specimen Description   Final    BLOOD RIGHT HAND Performed at [facility] [address], [place], [place] [address]    Special Requests   Final    BOTTLES DRAWN AEROBIC AND ANAEROBIC Blood Culture results may not be optimal due to an excessive volume of blood received in culture bottles Performed at [facility] [address] [place], [place] [address]    Culture   Final    NO GROWTH 5 DAYS Performed at [facility], [place] [address], [place], [place] [address]    Report Status 12/13/2019 FINAL  Final  Respiratory Panel by RT PCR (Flu A&B, Covid) - Nasopharyngeal Swab     Status: None   Collection Time: 12/08/19 10:00 PM   Specimen: Nasopharyngeal Swab  Result Value Ref Range Status   SARS Coronavirus 2 by RT PCR NEGATIVE NEGATIVE Final    Comment: (NOTE) SARS-CoV-2 target nucleic acids are NOT DETECTED. The SARS-CoV-2 RNA is generally detectable in upper  respiratoy specimens during the acute phase of infection. The lowest concentration of SARS-CoV-2 viral copies this assay can detect is 131 copies/mL. A negative result does not preclude SARS-Cov-2 infection and should not be used as the sole basis for treatment or other patient management decisions. A negative result may occur with  improper specimen collection/handling, submission of specimen other than nasopharyngeal swab, presence of  viral mutation(s) within the areas targeted by this assay, and inadequate number of viral copies (<131 copies/mL). A negative result must be combined with clinical observations, patient history, and epidemiological information. The expected result is Negative. Fact Sheet for Patients:  PinkCheek.be Fact Sheet for Healthcare Providers:  GravelBags.it This test is not yet ap proved or cleared by the [place] FDA and  has been authorized for detection and/or diagnosis of SARS-CoV-2 by FDA under an Emergency Use Authorization (EUA). This EUA will remain  in effect (meaning this test can be used) for the duration of the COVID-19 declaration under Section 564(b)(1) of the Act, 21 U.S.C. section 360bbb-3(b)(1), unless the authorization is terminated or revoked sooner.    Influenza A by PCR NEGATIVE NEGATIVE Final   Influenza B by PCR NEGATIVE NEGATIVE Final    Comment: (NOTE) The Xpert Xpress SARS-CoV-2/FLU/RSV assay is intended as an aid in  the diagnosis of influenza from Nasopharyngeal swab specimens and  should not be used as a sole basis for treatment. Nasal washings and  aspirates are unacceptable for Xpert Xpress SARS-CoV-2/FLU/RSV  testing. Fact Sheet for Patients: PinkCheek.be Fact Sheet for Healthcare Providers: GravelBags.it This test is not yet approved or cleared by the [place] FDA and  has been authorized for  detection and/or diagnosis of SARS-CoV-2 by  FDA under an Emergency Use Authorization (EUA). This EUA will remain  in effect (meaning this test can be used) for the duration of the  Covid-19 declaration under Section 564(b)(1) of the Act, 21  U.S.C. section 360bbb-3(b)(1), unless the authorization is  terminated or revoked. Performed at [facility], [place] [address], [place], [place] [address]      Labs: BNP (last 3 results) Recent Labs    09/26/19 1116  BNP 0000000   Basic Metabolic Panel: Recent Labs  Lab 12/09/19 0210 12/09/19 0210 12/09/19 1756 12/09/19 1756 12/10/19 0500 12/11/19 0428 12/12/19 0500 12/12/19 0754 12/13/19 0513  NA 141   < > 141  --  139 142  --  142 140  K 4.8   < > 5.6*  --  4.9 4.1  --  4.3 4.2  CL 109   < > 112*  --  111 116*  --  114* 111  CO2 20*   < > 19*  --  20* 21*  --  22 23  GLUCOSE 55*   < > 204*  --  249* 136*  --  96 124*  BUN 68*   < > 68*  --  65* 53*  --  42* 29*  CREATININE 5.22*   < > 4.10*   < > 3.57* 2.66* 2.24* 2.27* 1.67*  CALCIUM 7.7*   < > 7.6*  --  7.9* 7.8*  --  7.8* 8.0*  MG  --   --   --   --  2.5*  --   --   --   --   PHOS 5.2*  --   --   --   --   --   --   --   --    < > = values in this interval not displayed.   Liver Function Tests: Recent Labs  Lab 12/08/19 2014 12/09/19 0210 12/10/19 0500  AST 24  --  31  ALT 20  --  26  ALKPHOS 45  --  49  BILITOT 0.8  --  0.9  PROT 7.5  --  6.7  ALBUMIN 2.7* 2.3* 2.2*   No  results for input(s): LIPASE, AMYLASE in the last 168 hours. Recent Labs  Lab 12/09/19 0210  AMMONIA 26   CBC: Recent Labs  Lab 12/08/19 2014 12/10/19 0500 12/11/19 0428 12/12/19 0754 12/13/19 0513  WBC 17.9* 20.4* 15.9* 14.3* 14.5*  NEUTROABS 14.6* 18.5*  --   --   --   HGB 10.8* 10.6* 10.8* 10.6* 10.2*  HCT 34.6* 34.5* 34.3* 33.9* 32.9*  MCV 94.8 94.3 94.5 95.8 95.6  PLT 270 259 260 267 254   Cardiac Enzymes: No results for input(s): CKTOTAL, CKMB, CKMBINDEX,  TROPONINI in the last 168 hours. BNP: Invalid input(s): POCBNP CBG: Recent Labs  Lab 12/12/19 0733 12/12/19 1126 12/12/19 1623 12/12/19 2003 12/13/19 0719  GLUCAP 129* 228* 206* 159* 118*   D-Dimer No results for input(s): DDIMER in the last 72 hours. Hgb A1c No results for input(s): HGBA1C in the last 72 hours. Lipid Profile No results for input(s): CHOL, HDL, LDLCALC, TRIG, CHOLHDL, LDLDIRECT in the last 72 hours. Thyroid function studies No results for input(s): TSH, T4TOTAL, T3FREE, THYROIDAB in the last 72 hours.  Invalid input(s): FREET3 Anemia work up No results for input(s): VITAMINB12, FOLATE, FERRITIN, TIBC, IRON, RETICCTPCT in the last 72 hours. Urinalysis    Component Value Date/Time   COLORURINE AMBER (A) 12/08/2019 2014   APPEARANCEUR CLOUDY (A) 12/08/2019 2014   LABSPEC 1.016 12/08/2019 2014   PHURINE 5.0 12/08/2019 2014   GLUCOSEU NEGATIVE 12/08/2019 2014   HGBUR NEGATIVE 12/08/2019 2014   BILIRUBINUR NEGATIVE 12/08/2019 2014   BILIRUBINUR negative 10/20/2018 1533   BILIRUBINUR negative 01/29/2018 [place] 12/08/2019 2014   PROTEINUR 100 (A) 12/08/2019 2014   UROBILINOGEN 0.2 10/20/2018 1533   UROBILINOGEN 0.2 04/14/2015 1932   NITRITE NEGATIVE 12/08/2019 2014   LEUKOCYTESUR LARGE (A) 12/08/2019 2014   Sepsis Labs Invalid input(s): PROCALCITONIN,  WBC,  LACTICIDVEN Microbiology Recent Results (from the past 240 hour(s))  Blood Culture (routine x 2)     Status: None   Collection Time: 12/08/19  8:14 PM   Specimen: BLOOD LEFT FOREARM  Result Value Ref Range Status   Specimen Description BLOOD LEFT FOREARM  Final   Special Requests   Final    BOTTLES DRAWN AEROBIC ONLY Blood Culture results may not be optimal due to an inadequate volume of blood received in culture bottles Performed at [facility], [place] [address], [place], [place] [address]    Culture   Final    NO GROWTH 5 DAYS Performed at [facility], [place] [address], [place], [place] [address]    Report Status 12/13/2019 FINAL  Final  Urine culture     Status: Abnormal   Collection Time: 12/08/19  8:14 PM   Specimen: In/Out Cath Urine  Result Value Ref Range Status   Specimen Description   Final    IN/OUT CATH URINE Performed at [facility] [address] [place], [place] [address]    Special Requests   Final    NONE Performed at [facility], [place] [address] [place], [place] [address]    Culture >=100,000 COLONIES/mL ESCHERICHIA COLI (A)  Final   Report Status 12/10/2019 FINAL  Final   Organism ID, Bacteria ESCHERICHIA COLI (A)  Final      Susceptibility   Escherichia coli - MIC*    AMPICILLIN >=32 RESISTANT Resistant     CEFAZOLIN >=64 RESISTANT Resistant     CEFTRIAXONE <=1 SENSITIVE Sensitive     CIPROFLOXACIN <=  0.25 SENSITIVE Sensitive     GENTAMICIN <=1 SENSITIVE Sensitive     IMIPENEM <=0.25 SENSITIVE Sensitive     NITROFURANTOIN <=16 SENSITIVE Sensitive     TRIMETH/SULFA <=20 SENSITIVE Sensitive     AMPICILLIN/SULBACTAM >=32 RESISTANT Resistant     PIP/TAZO 8 SENSITIVE Sensitive     * >=100,000 COLONIES/mL ESCHERICHIA COLI  Blood Culture (routine x 2)     Status: None   Collection Time: 12/08/19  8:45 PM   Specimen: BLOOD  Result Value Ref Range Status   Specimen Description   Final    BLOOD RIGHT HAND Performed at [facility] [address], [place], [place] [address]    Special Requests   Final    BOTTLES DRAWN AEROBIC AND ANAEROBIC Blood Culture results may not be optimal due to an excessive volume of blood received in culture bottles Performed at [facility] [address], [place], [place] [address]    Culture   Final    NO GROWTH 5 DAYS Performed at [facility], [place] [address], [place], [place] [address]    Report Status 12/13/2019 FINAL  Final  Respiratory Panel by RT PCR (Flu A&B, Covid) -  Nasopharyngeal Swab     Status: None   Collection Time: 12/08/19 10:00 PM   Specimen: Nasopharyngeal Swab  Result Value Ref Range Status   SARS Coronavirus 2 by RT PCR NEGATIVE NEGATIVE Final    Comment: (NOTE) SARS-CoV-2 target nucleic acids are NOT DETECTED. The SARS-CoV-2 RNA is generally detectable in upper respiratoy specimens during the acute phase of infection. The lowest concentration of SARS-CoV-2 viral copies this assay can detect is 131 copies/mL. A negative result does not preclude SARS-Cov-2 infection and should not be used as the sole basis for treatment or other patient management decisions. A negative result may occur with  improper specimen collection/handling, submission of specimen other than nasopharyngeal swab, presence of viral mutation(s) within the areas targeted by this assay, and inadequate number of viral copies (<131 copies/mL). A negative result must be combined with clinical observations, patient history, and epidemiological information. The expected result is Negative. Fact Sheet for Patients:  PinkCheek.be Fact Sheet for Healthcare Providers:  GravelBags.it This test is not yet ap proved or cleared by the [place] FDA and  has been authorized for detection and/or diagnosis of SARS-CoV-2 by FDA under an Emergency Use Authorization (EUA). This EUA will remain  in effect (meaning this test can be used) for the duration of the COVID-19 declaration under Section 564(b)(1) of the Act, 21 U.S.C. section 360bbb-3(b)(1), unless the authorization is terminated or revoked sooner.    Influenza A by PCR NEGATIVE NEGATIVE Final   Influenza B by PCR NEGATIVE NEGATIVE Final    Comment: (NOTE) The Xpert Xpress SARS-CoV-2/FLU/RSV assay is intended as an aid in  the diagnosis of influenza from Nasopharyngeal swab specimens and  should not be used as a sole basis for treatment. Nasal washings and  aspirates  are unacceptable for Xpert Xpress SARS-CoV-2/FLU/RSV  testing. Fact Sheet for Patients: PinkCheek.be Fact Sheet for Healthcare Providers: GravelBags.it This test is not yet approved or cleared by the [place] FDA and  has been authorized for detection and/or diagnosis of SARS-CoV-2 by  FDA under an Emergency Use Authorization (EUA). This EUA will remain  in effect (meaning this test can be used) for the duration of the  Covid-19 declaration under Section 564(b)(1) of the Act, 21  U.S.C. section 360bbb-3(b)(1), unless the  authorization is  terminated or revoked. Performed at [facility], [place] [address], [place], [place] [address]      Time coordinating discharge: 35 minutes  SIGNED:   Cordelia Poche, MD [facility] 12/13/2019, 10:13 AM

## 2019-12-14 ENCOUNTER — Telehealth: Payer: Self-pay

## 2019-12-14 NOTE — Telephone Encounter (Signed)
Transition Care Management Follow-up Telephone Call Date of discharge and from where: 12/13/2019, [facility]   Call placed to patient # [phone], message left with call back requested to this CM.    Patient needs to schedule follow up appointment with Dr Wynetta Emery.

## 2019-12-15 ENCOUNTER — Emergency Department (HOSPITAL_COMMUNITY): Payer: Medicaid Other

## 2019-12-15 ENCOUNTER — Emergency Department (HOSPITAL_COMMUNITY)
Admission: EM | Admit: 2019-12-15 | Discharge: 2019-12-15 | Disposition: A | Discharge status: Home / Self Care | Payer: Medicaid Other | Attending: Emergency Medicine | Admitting: Emergency Medicine

## 2019-12-15 ENCOUNTER — Ambulatory Visit: Payer: Medicaid Other | Attending: Internal Medicine | Admitting: Internal Medicine

## 2019-12-15 ENCOUNTER — Encounter (HOSPITAL_COMMUNITY): Payer: Self-pay

## 2019-12-15 ENCOUNTER — Other Ambulatory Visit: Payer: Self-pay

## 2019-12-15 ENCOUNTER — Encounter: Payer: Self-pay | Admitting: Internal Medicine

## 2019-12-15 VITALS — BP 75/44 | HR 77 | Temp 97.0°F | Resp 16 | Wt 242.2 lb

## 2019-12-15 DIAGNOSIS — I5042 Chronic combined systolic (congestive) and diastolic (congestive) heart failure: Secondary | ICD-10-CM | POA: Insufficient documentation

## 2019-12-15 DIAGNOSIS — R531 Weakness: Secondary | ICD-10-CM | POA: Insufficient documentation

## 2019-12-15 DIAGNOSIS — R300 Dysuria: Secondary | ICD-10-CM | POA: Diagnosis not present

## 2019-12-15 DIAGNOSIS — I959 Hypotension, unspecified: Secondary | ICD-10-CM | POA: Diagnosis not present

## 2019-12-15 DIAGNOSIS — E11649 Type 2 diabetes mellitus with hypoglycemia without coma: Secondary | ICD-10-CM | POA: Diagnosis present

## 2019-12-15 DIAGNOSIS — Z20822 Contact with and (suspected) exposure to covid-19: Secondary | ICD-10-CM | POA: Diagnosis not present

## 2019-12-15 DIAGNOSIS — Z794 Long term (current) use of insulin: Secondary | ICD-10-CM

## 2019-12-15 DIAGNOSIS — N12 Tubulo-interstitial nephritis, not specified as acute or chronic: Secondary | ICD-10-CM

## 2019-12-15 DIAGNOSIS — Z79899 Other long term (current) drug therapy: Secondary | ICD-10-CM | POA: Diagnosis not present

## 2019-12-15 DIAGNOSIS — A419 Sepsis, unspecified organism: Secondary | ICD-10-CM | POA: Diagnosis not present

## 2019-12-15 DIAGNOSIS — R1084 Generalized abdominal pain: Secondary | ICD-10-CM | POA: Diagnosis not present

## 2019-12-15 DIAGNOSIS — R110 Nausea: Secondary | ICD-10-CM | POA: Insufficient documentation

## 2019-12-15 DIAGNOSIS — I130 Hypertensive heart and chronic kidney disease with heart failure and stage 1 through stage 4 chronic kidney disease, or unspecified chronic kidney disease: Secondary | ICD-10-CM | POA: Insufficient documentation

## 2019-12-15 DIAGNOSIS — Z09 Encounter for follow-up examination after completed treatment for conditions other than malignant neoplasm: Secondary | ICD-10-CM

## 2019-12-15 DIAGNOSIS — Z7409 Other reduced mobility: Secondary | ICD-10-CM

## 2019-12-15 DIAGNOSIS — E1122 Type 2 diabetes mellitus with diabetic chronic kidney disease: Secondary | ICD-10-CM | POA: Diagnosis not present

## 2019-12-15 DIAGNOSIS — N183 Chronic kidney disease, stage 3 unspecified: Secondary | ICD-10-CM | POA: Insufficient documentation

## 2019-12-15 DIAGNOSIS — M7918 Myalgia, other site: Secondary | ICD-10-CM

## 2019-12-15 DIAGNOSIS — E162 Hypoglycemia, unspecified: Secondary | ICD-10-CM

## 2019-12-15 LAB — CBG MONITORING, ED
Glucose-Capillary: 37 mg/dL — CL (ref 70–99)
Glucose-Capillary: 40 mg/dL — CL (ref 70–99)
Glucose-Capillary: 256 mg/dL — ABNORMAL HIGH (ref 70–99)
Glucose-Capillary: 48 mg/dL — ABNORMAL LOW (ref 70–99)
Glucose-Capillary: 118 mg/dL — ABNORMAL HIGH (ref 70–99)

## 2019-12-15 LAB — COMPREHENSIVE METABOLIC PANEL
ALT: 47 U/L — ABNORMAL HIGH (ref 0–44)
AST: 27 U/L (ref 15–41)
Albumin: 2.7 g/dL — ABNORMAL LOW (ref 3.5–5.0)
Alkaline Phosphatase: 65 U/L (ref 38–126)
Anion gap: 11 (ref 5–15)
BUN: 16 mg/dL (ref 8–23)
CO2: 26 mmol/L (ref 22–32)
Calcium: 8.4 mg/dL — ABNORMAL LOW (ref 8.9–10.3)
Chloride: 103 mmol/L (ref 98–111)
Creatinine, Ser: 1.72 mg/dL — ABNORMAL HIGH (ref 0.61–1.24)
GFR calc Af Amer: 48 mL/min — ABNORMAL LOW (ref >60)
GFR calc non Af Amer: 41 mL/min — ABNORMAL LOW (ref >60)
Glucose, Bld: 39 mg/dL — CL (ref 70–99)
Potassium: 4.2 mmol/L (ref 3.5–5.1)
Sodium: 140 mmol/L (ref 135–145)
Total Bilirubin: 1 mg/dL (ref 0.3–1.2)
Total Protein: 7.6 g/dL (ref 6.5–8.1)

## 2019-12-15 LAB — CBC WITH DIFFERENTIAL/PLATELET
Abs Immature Granulocytes: 0.22 10*3/uL — ABNORMAL HIGH (ref 0.00–0.07)
Basophils Absolute: 0 10*3/uL (ref 0.0–0.1)
Basophils Relative: 0 %
Eosinophils Absolute: 0.4 10*3/uL (ref 0.0–0.5)
Eosinophils Relative: 3 %
HCT: 36.6 % — ABNORMAL LOW (ref 39.0–52.0)
Hemoglobin: 11.4 g/dL — ABNORMAL LOW (ref 13.0–17.0)
Immature Granulocytes: 1 %
Lymphocytes Relative: 11 %
Lymphs Abs: 1.7 10*3/uL (ref 0.7–4.0)
MCH: 29.5 pg (ref 26.0–34.0)
MCHC: 31.1 g/dL (ref 30.0–36.0)
MCV: 94.6 fL (ref 80.0–100.0)
Monocytes Absolute: 1.5 10*3/uL — ABNORMAL HIGH (ref 0.1–1.0)
Monocytes Relative: 10 %
Neutro Abs: 11.6 10*3/uL — ABNORMAL HIGH (ref 1.7–7.7)
Neutrophils Relative %: 75 %
Platelets: 344 10*3/uL (ref 150–400)
RBC: 3.87 MIL/uL — ABNORMAL LOW (ref 4.22–5.81)
RDW: 15.9 % — ABNORMAL HIGH (ref 11.5–15.5)
WBC: 15.5 10*3/uL — ABNORMAL HIGH (ref 4.0–10.5)
nRBC: 0 % (ref 0.0–0.2)

## 2019-12-15 LAB — LIPASE, BLOOD: Lipase: 38 U/L (ref 11–51)

## 2019-12-15 LAB — GLUCOSE, POCT (MANUAL RESULT ENTRY)
POC Glucose: 42 mg/dl — AB (ref 70–99)
POC Glucose: 97 mg/dl (ref 70–99)

## 2019-12-15 LAB — URINALYSIS, ROUTINE W REFLEX MICROSCOPIC
Bilirubin Urine: NEGATIVE
Glucose, UA: NEGATIVE mg/dL
Hgb urine dipstick: NEGATIVE
Ketones, ur: NEGATIVE mg/dL
Leukocytes,Ua: NEGATIVE
Nitrite: NEGATIVE
Protein, ur: NEGATIVE mg/dL
Specific Gravity, Urine: 1.01 (ref 1.005–1.030)
pH: 5 (ref 5.0–8.0)

## 2019-12-15 LAB — RESPIRATORY PANEL BY RT PCR (FLU A&B, COVID)
Influenza A by PCR: NEGATIVE
Influenza B by PCR: NEGATIVE
SARS Coronavirus 2 by RT PCR: NEGATIVE

## 2019-12-15 LAB — LACTIC ACID, PLASMA
Lactic Acid, Venous: 2.1 mmol/L (ref 0.5–1.9)
Lactic Acid, Venous: 1.3 mmol/L (ref 0.5–1.9)

## 2019-12-15 MED ORDER — SODIUM CHLORIDE 0.9 % IV SOLN
2.0000 g | Freq: Once | INTRAVENOUS | Status: DC
  Administered 2019-12-15: 2 g via INTRAVENOUS
  Filled 2019-12-15: qty 2

## 2019-12-15 MED ORDER — IOHEXOL 300 MG/ML  SOLN
100.0000 mL | Freq: Once | INTRAMUSCULAR | Status: AC | PRN
  Administered 2019-12-15: 100 mL via INTRAVENOUS

## 2019-12-15 MED ORDER — DEXTROSE 50 % IV SOLN
50.0000 mL | Freq: Once | INTRAVENOUS | Status: AC
  Administered 2019-12-15: 11:00:00 50 mL via INTRAVENOUS
  Filled 2019-12-15: qty 50

## 2019-12-15 MED ORDER — LACTATED RINGERS IV BOLUS (SEPSIS): 1000.0000 mL | Freq: Once | INTRAVENOUS | Status: DC

## 2019-12-15 MED ORDER — METRONIDAZOLE IN NACL 5-0.79 MG/ML-% IV SOLN
500.0000 mg | Freq: Once | INTRAVENOUS | Status: AC
  Administered 2019-12-15: 500 mg via INTRAVENOUS
  Filled 2019-12-15: qty 100

## 2019-12-15 MED ORDER — VANCOMYCIN HCL IN DEXTROSE 1-5 GM/200ML-% IV SOLN
1000.0000 mg | Freq: Once | INTRAVENOUS | Status: AC
  Administered 2019-12-15: 15:00:00 1000 mg via INTRAVENOUS
  Filled 2019-12-15: qty 200

## 2019-12-15 MED ORDER — LACTATED RINGERS IV BOLUS (SEPSIS): 500.0000 mL | Freq: Once | INTRAVENOUS | Status: DC

## 2019-12-15 MED ORDER — ACETAMINOPHEN 325 MG PO TABS
650.0000 mg | ORAL_TABLET | Freq: Once | ORAL | Status: AC
  Administered 2019-12-15: 650 mg via ORAL
  Filled 2019-12-15: qty 2

## 2019-12-15 MED ORDER — OXYCODONE HCL 5 MG PO TABS
5.0000 mg | ORAL_TABLET | Freq: Once | ORAL | Status: AC
  Administered 2019-12-15: 5 mg via ORAL
  Filled 2019-12-15: qty 1

## 2019-12-15 MED ORDER — SODIUM CHLORIDE 0.9 % IV SOLN: INTRAVENOUS | Status: DC

## 2019-12-15 MED ORDER — SODIUM CHLORIDE (PF) 0.9 % IJ SOLN
INTRAMUSCULAR | Status: AC
  Filled 2019-12-15: qty 50

## 2019-12-15 MED ORDER — LACTATED RINGERS IV BOLUS (SEPSIS)
1000.0000 mL | Freq: Once | INTRAVENOUS | Status: DC
  Administered 2019-12-15: 1000 mL via INTRAVENOUS

## 2019-12-15 MED ORDER — DEXTROSE 50 % IV SOLN
1.0000 | Freq: Once | INTRAVENOUS | Status: AC
  Administered 2019-12-15: 14:00:00 50 mL via INTRAVENOUS
  Filled 2019-12-15: qty 50

## 2019-12-15 NOTE — ED Provider Notes (Addendum)
[facility] DEPT Provider Note   CSN: [id number] Arrival date & time: 12/15/19  1009     History Chief Complaint  Patient presents with  . Weakness  . Hypoglycemia    Marco [name] is a 64 y.o. male.  64 year old male with history of diabetes and recent admission for urosepsis who is on Omnicef after being discharged from the hospital 4 days ago presents with fever, weakness, cough and shortness of breath.  Was also found to have low blood sugar and low blood pressure at his doctor's office when he was seen there today for a recheck following his recent hospitalization.  He denies any vomiting or diarrhea.  Has some nausea.  Has had some crampy abdominal pain.  Continues to note some dysuria.  Sent here for further evaluation        Past Medical History:  Diagnosis Date  . Benign colon polyp 08/01/2013   [facility] in [place]. large base tranverse colon polyp was biopsied. polyp was benign with minimal surface hyperplastic change.  . Chronic combined systolic and diastolic CHF (congestive heart failure) ([place])   . Chronic pain   . CKD (chronic kidney disease), stage III   . Diabetes mellitus without complication ([place])   . Diverticulosis   . Esophageal hiatal hernia 07/29/2013   confirmed on EGD   . Esophageal stricture   . Essential hypertension   . Gastritis 07/29/2013   confirmed on EGD, bx done an negative for intestinal metaplasia, dsyplasia or H. pylori. normal gastric emptying study done 07/13/2013.  [name] GERD (gastroesophageal reflux disease)   . Morbid obesity ([place])   . Non-obstructive CAD    a. 02/2013 Cath ([place]): nonobs dzs;  b. 09/2014 Myoview ([name]): EF 55%, no ischemia;  c. Cath 05/2018 mild CAD no obstruction.  . Persistent atrial fibrillation ([place])    a. 02/2013 s/p rfca in [place], [place]-->prev on Xarelto, d/c'd 2/2 anemia, ? GIB.  [name] Rheumatoid arthritis ([place])   . Sigmoid diverticulosis 08/01/2013   confirmed on  colonscopy. record scanned into chart    Patient Active Problem List   Diagnosis Date Noted  . Severe sepsis ([place]) 12/10/2019  . Acute metabolic encephalopathy 0000000  . Hyperkalemia 12/09/2019  . Sepsis due to Gram negative bacteria ([place]) 12/09/2019  . Rhinitis 10/16/2019  . Gastroesophageal reflux disease without esophagitis 08/31/2018  . Muscle spasm 08/31/2018  . NICM (nonischemic cardiomyopathy) ([place]) 07/06/2018  . Hypokalemia   . Acute renal failure superimposed on stage 3 chronic kidney disease ([place]) 08/27/2017  . Hyperlipidemia 12/23/2016  . Chronic combined systolic and diastolic CHF (congestive heart failure) ([place]) 09/09/2016  . Diabetic polyneuropathy associated with type 2 diabetes mellitus ([place]) 06/10/2016  . Vision changes 06/02/2016  . Myalgia and myositis 03/31/2016  . Shortness of breath 02/09/2016  . Chronic gouty arthritis 11/29/2015  . LBP (low back pain) 11/27/2015  . Arthralgia of multiple joints 11/27/2015  . 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[place]) 11/27/2015  . Breast pain in male 11/14/2015  . Sinusitis, chronic 10/16/2015  . Insomnia 10/16/2015  . New onset of headaches after age 65 09/20/2015  . OSA (obstructive sleep apnea) 07/12/2015  . Low serum testosterone level 05/18/2015  . Depression 05/16/2015  . Morbid obesity due to excess calories ([place])   . Chronic pain   . Type 2 diabetes mellitus with stage 3 chronic kidney disease, without long-term current use of insulin ([place])   . CAD (coronary artery disease) 12/24/2014  . Atrial fibrillation ([place]) 12/24/2014  .  Essential hypertension 12/24/2014  . Chronic kidney disease 12/24/2014  . PUD (peptic ulcer disease) 12/24/2014    Past Surgical History:  Procedure Laterality Date  . CARDIAC CATHETERIZATION  05/2014   ablation for atrial fibrillation  . CARDIAC CATHETERIZATION N/A 11/16/2015   Procedure: Left Heart Cath and Coronary Angiography;  Surgeon: Leonie Man, MD;  Location: [facility];   Service: Cardiovascular;  Laterality: N/A;  . COLON RESECTION  09/2013   due to large, abnormal polpy. non cancerous per patient.   . COLON SURGERY  09/2014   colon resection   . LEFT HEART CATH AND CORONARY ANGIOGRAPHY N/A 05/19/2018   Procedure: LEFT HEART CATH AND CORONARY ANGIOGRAPHY;  Surgeon: Wellington Hampshire, MD;  Location: [facility];  Service: Cardiovascular;  Laterality: N/A;       Family History  Problem Relation Age of Onset  . Hypertension Mother   . Diabetes Mother   . COPD Mother   . Lung cancer Mother        Smoker   . Hypertension Father   . Diabetes Father   . Heart Problems Father   . COPD Father   . Colon cancer Neg Hx   . Stomach cancer Neg Hx     Social History   Tobacco Use  . Smoking status: Former Smoker    Packs/day: 0.50    Years: 10.00    Pack years: 5.00    Types: Cigarettes    Quit date: 04/11/2008    Years since quitting: 11.6  . Smokeless tobacco: Never Used  Substance Use Topics  . Alcohol use: No    Alcohol/week: 0.0 standard drinks  . Drug use: No    Home Medications Prior to Admission medications   Medication Sig Start Date End Date Taking? Authorizing Provider  allopurinol (ZYLOPRIM) 100 MG tablet Take 200 mg by mouth every evening.    Yes [provider]  atorvastatin (LIPITOR) 40 MG tablet Take 1 tablet (40 mg total) by mouth daily. 09/27/19  Yes Sueanne Margarita, MD  carvedilol (COREG) 12.5 MG tablet Take 1 tablet (12.5 mg total) by mouth 2 (two) times daily with a meal. 09/12/19  Yes Larey Dresser, MD  cefdinir (OMNICEF) 300 MG capsule Take 1 capsule (300 mg total) by mouth every 12 (twelve) hours for 6 days. 12/14/19 12/20/19 Yes Mariel Aloe, MD  DULoxetine (CYMBALTA) 20 MG capsule Take 1 capsule by mouth once daily Patient taking differently: Take 20 mg by mouth daily.  06/15/19  Yes [name], MD  EQ ASPIRIN ADULT LOW DOSE 81 MG EC tablet TAKE 1 TABLET BY MOUTH ONCE DAILY. SWALLOW WHOLE Patient  taking differently: Take 81 mg by mouth at bedtime.  08/01/19  Yes [name], MD  famotidine (PEPCID) 20 MG tablet Take 20 mg by mouth 2 (two) times daily.   Yes [provider]  furosemide (LASIX) 40 MG tablet Take 1 tablet (40 mg total) by mouth 2 (two) times daily. 07/13/18 12/15/19 Yes Weaver, Scott T, [name]  hydrALAZINE (APRESOLINE) 50 MG tablet TAKE 1 TABLET BY MOUTH EVERY 8 HOURS Patient taking differently: Take 50 mg by mouth every 8 (eight) hours.  10/24/19  Yes [name], MD  Insulin Lispro Prot & Lispro (HUMALOG MIX 75/25 KWIKPEN) (75-25) 100 UNIT/ML Kwikpen Inject 150 Units into the skin daily with breakfast. Patient taking differently: Inject 160 Units into the skin daily with breakfast.  07/29/19  Yes Renato Shin, MD  isosorbide mononitrate (IMDUR) 30 MG 24 hr tablet Take 1 tablet (30 mg total) by mouth daily. 09/12/19  Yes Turner, Eber Hong, MD  oxyCODONE (OXY IR/ROXICODONE) 5 MG immediate release tablet Take 5 mg by mouth every 6 (six) hours. 11/09/19  Yes [provider]  potassium chloride SA (KLOR-CON) 20 MEQ tablet Take 20 mEq by mouth daily. 12/05/19  Yes [provider]  predniSONE (DELTASONE) 5 MG tablet Take 5 mg by mouth daily. 11/23/19  Yes [provider]  pregabalin (LYRICA) 100 MG capsule Take 100 mg by mouth 3 (three) times daily.    Yes [provider]  sacubitril-valsartan (ENTRESTO) 97-103 MG Take 1 tablet by mouth 2 (two) times daily. 10/24/19  Yes Bensimhon, Shaune Pascal, MD  Secukinumab (COSENTYX) 150 MG/ML SOSY Inject 150 mg into the skin every 28 (twenty-eight) days.  06/02/16  Yes [provider]  Tafamidis (VYNDAMAX) 61 MG CAPS Take 61 mg by mouth daily. 09/08/19  Yes Bensimhon, Shaune Pascal, MD  zolpidem (AMBIEN CR) 6.25 MG CR tablet TAKE 1 TABLET BY MOUTH EVERY DAY AT BEDTIME AS NEEDED FOR SLEEP Patient taking differently: Take 6.25 mg by mouth at bedtime.  12/02/19  Yes [name], MD  Accu-Chek  FastClix Lancets MISC Use one strip to monitor glucose levels BID; E11.42 12/06/18   Renato Shin, MD  albuterol (PROVENTIL HFA;VENTOLIN HFA) 108 (90 Base) MCG/ACT inhaler Inhale 2 puffs into the lungs every 6 (six) hours as needed for wheezing or shortness of breath. Patient not taking: Reported on 12/08/2019 04/29/18   [name], MD  diclofenac Sodium (VOLTAREN) 1 % GEL Apply 4 g topically 4 (four) times daily. Patient not taking: Reported on 12/08/2019 10/23/19   Darr, Marguerita Beards, [name]  fluticasone [facility]) 50 MCG/ACT nasal spray Place 2 sprays into both nostrils daily. Patient not taking: Reported on 12/08/2019 09/16/19   [name], MD  glucose blood (ACCU-CHEK GUIDE) test strip Use one strip to monitor glucose levels BID; E11.42 12/06/18   Renato Shin, MD  Insulin Pen Needle (PEN NEEDLES) 31G X 6 MM MISC 1 each by Does not apply route daily. E11.9 10/19/19   Renato Shin, MD    Allergies    Nsaids  Review of Systems   Review of Systems  All other systems reviewed and are negative.   Physical Exam Updated Vital Signs BP 105/62   Pulse 77   Temp 100.3 F (37.9 C) (Oral)   Resp (!) 25   SpO2 95%   Physical Exam Vitals and nursing note reviewed.  Constitutional:      General: He is not in acute distress.    Appearance: Normal appearance. He is well-developed. He is not toxic-appearing.  HENT:     Head: Normocephalic and atraumatic.  Eyes:     General: Lids are normal.     Conjunctiva/sclera: Conjunctivae normal.     Pupils: Pupils are equal, round, and reactive to light.  Neck:     Thyroid: No thyroid mass.     Trachea: No tracheal deviation.  Cardiovascular:     Rate and Rhythm: Normal rate and regular rhythm.     Heart sounds: Normal heart sounds. No murmur. No gallop.   Pulmonary:     Effort: Pulmonary effort is normal. No respiratory distress.     Breath sounds: Normal breath sounds. No stridor. No decreased breath sounds, wheezing, rhonchi or rales.    Abdominal:     General: Bowel sounds are normal. There is  no distension.     Palpations: Abdomen is soft.     Tenderness: There is generalized abdominal tenderness. There is no guarding or rebound.    Musculoskeletal:        General: No tenderness. Normal range of motion.     Cervical back: Normal range of motion and neck supple.  Skin:    General: Skin is warm and dry.     Findings: No abrasion or rash.  Neurological:     General: No focal deficit present.     Mental Status: He is alert and oriented to person, place, and time.     GCS: GCS eye subscore is 4. GCS verbal subscore is 5. GCS motor subscore is 6.     Cranial Nerves: No cranial nerve deficit.     Sensory: No sensory deficit.     Motor: No tremor.  Psychiatric:        Attention and Perception: Attention normal.        Mood and Affect: Affect is flat.        Speech: Speech normal.        Behavior: Behavior normal.     ED Results / Procedures / Treatments   Labs (all labs ordered are listed, but only abnormal results are displayed) Labs Reviewed  CBG MONITORING, ED - Abnormal; Notable for the following components:      Result Value   Glucose-Capillary 37 (*)    All other components within normal limits  CBG MONITORING, ED - Abnormal; Notable for the following components:   Glucose-Capillary 40 (*)    All other components within normal limits    EKG None  Radiology No results found.  Procedures Procedures (including critical care time)  Medications Ordered in ED Medications  0.9 %  sodium chloride infusion (has no administration in time range)  dextrose 50 % solution 50 mL (50 mLs Intravenous Given 12/15/19 1054)    ED Course  I have reviewed the triage vital signs and the nursing notes.  Pertinent labs & imaging results that were available during my care of the patient were reviewed by me and considered in my medical decision making (see chart for details).    MDM Rules/Calculators/A&P                       Patient with low blood sugar here which was corrected he feels much better.  He relates that he has not been eating proper meals.  Lactate initially was 2.1 but repeat is 1.3.  Had received IV fluids and antibiotics but given no clear source of infection these were discontinued.  No source for infection found.  Patient feels much better after treatment here.  Covid and flu test negative.  Abdominal CT results reviewed and patient has no tenderness to his right upper quadrant at this time.  Patient feels comfortable going home and return precautions given Final Clinical Impression(s) / ED Diagnoses Final diagnoses:  None    Rx / DC Orders ED Discharge Orders    None       Lacretia Leigh, MD 12/15/19 1647    Lacretia Leigh, MD 12/15/19 1649

## 2019-12-15 NOTE — Discharge Instructions (Addendum)
Make sure you eat regular meals.  Return here if you develop increasing fever, vomiting, or any other problems

## 2019-12-15 NOTE — Patient Instructions (Addendum)
You should be seen in the emergency room for further evaluation and management.  Until your oral intake is better, you may have to lower the dose of insulin from 150 units daily to 140 units daily.  Once you were eating better you can go back to taking the 150 units daily.  Hold hydralazine until your blood pressure is better. I will give our caseworker prescription to get a blood pressure monitoring device for you.  I have called the charge nurse at [facility] to let them know that she will be coming.

## 2019-12-15 NOTE — Progress Notes (Signed)
A consult was received from an ED physician for vancomycin and cefepime per pharmacy dosing.  The patient's profile has been reviewed for ht/wt/allergies/indication/available labs.   A one time order has been placed for vancomycin 1g and cefepime 2g.  Further antibiotics/pharmacy consults should be ordered by admitting physician if indicated.                       Thank you, Peggyann Juba, PharmD, BCPS Pharmacy: [phone] 12/15/2019  2:36 PM

## 2019-12-15 NOTE — ED Triage Notes (Signed)
Pt arrives POV from home with complaints of weakness and hypoglycemia. Pts CBG upon arrival 70. Pt provided with orange juice. Pt reports he was just dc'ed from here on Tuesday for a kidney infection.

## 2019-12-15 NOTE — ED Notes (Signed)
Pt able to provide urine sample upon request.

## 2019-12-15 NOTE — ED Notes (Signed)
Family at bedside. 

## 2019-12-15 NOTE — ED Notes (Signed)
Pt provided with orange juice and graham crackers.  

## 2019-12-15 NOTE — Progress Notes (Signed)
Patient ID: Marco [name], male    DOB: 01/18/1956  MRN: [id number]  CC: Transition of care  patient hospitalized 4/29-12/13/2019   Subjective: Akzel [name] is a 64 y.o. male who presents for transition of care.  Wife is with him. His concerns today include:  history of HTN,DM2 with neuropathy, and nephropathy, CKD stage III, obesity, nonobstructive CAD/cardiac amyloid, chronic combined CHF EF 55-60%, PAF s/p ablation 2017 (Xarelto d/c due to recurrent anemia , OSA on VPAP, followed by rheumatology in [facility] for psoriasis/gout/nonspec arthritis, chronic insomnia. On narcotics through [facility] for chronic pain inchest/back/shoulders/neck.  Patient hospitalized 4/29-5 nephritis and severe sepsis, pyelonephritis and acute kidney injury on CKD.  Urine culture was positive for E. coli.  He was treated appropriately with antibiotics and IV fluids due to dehydration.  CAT scan of the head was negative for any acute process.  CAT scan of the abdomen suggested likely right kidney infection.  He was also having hypoglycemia during admission resulting in his insulin being held.  Coreg, Entresto, Lasix were also held during hospitalization but restarted on discharge.  Today patient reports that he is not doing well.  Since hospital discharge he has been having shaking in his arms, weakness and decreased mobility.  He has had difficulty getting out of bed and has to use a bedside urinal.  He fell in his bathroom this morning hitting his back against the toilet.  Complains of pain in both buttocks for the past 1 to 2 weeks.  It hurts to sit or lay in bed.  He is completing a course of antibiotics Omnicef for the pyelonephritis.  He denies any fever since being home.  Reports decreased appetite and dizziness with standing.  Some shortness of breath when he attempts to ambulate.  Denies any chest pains.  Reports that his blood sugars have been in the 100s since hospital discharge until this morning when  it was 29.  He drank some orange juice and ate some fruits before coming to the office today.  Blood sugar here in the office was found to be in the 40s.  Patient was given a snack with subsequent increase in his blood sugar to 97.    Patient Active Problem List   Diagnosis Date Noted  . Severe sepsis ([place]) 12/10/2019  . Acute metabolic encephalopathy 0000000  . Hyperkalemia 12/09/2019  . Sepsis due to Gram negative bacteria ([place]) 12/09/2019  . Rhinitis 10/16/2019  . Gastroesophageal reflux disease without esophagitis 08/31/2018  . Muscle spasm 08/31/2018  . NICM (nonischemic cardiomyopathy) ([place]) 07/06/2018  . Hypokalemia   . Acute renal failure superimposed on stage 3 chronic kidney disease ([place]) 08/27/2017  . Hyperlipidemia 12/23/2016  . Chronic combined systolic and diastolic CHF (congestive heart failure) ([place]) 09/09/2016  . Diabetic polyneuropathy associated with type 2 diabetes mellitus ([place]) 06/10/2016  . Vision changes 06/02/2016  . Myalgia and myositis 03/31/2016  . Shortness of breath 02/09/2016  . Chronic gouty arthritis 11/29/2015  . LBP (low back pain) 11/27/2015  . Arthralgia of multiple joints 11/27/2015  . 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[place]) 11/27/2015  . Breast pain in male 11/14/2015  . Sinusitis, chronic 10/16/2015  . Insomnia 10/16/2015  . New onset of headaches after age 77 09/20/2015  . OSA (obstructive sleep apnea) 07/12/2015  . Low serum testosterone level 05/18/2015  . Depression 05/16/2015  . Morbid obesity due to excess calories ([place])   . Chronic pain   . Type 2 diabetes mellitus with stage 3 chronic kidney  disease, without long-term current use of insulin ([place])   . CAD (coronary artery disease) 12/24/2014  . Atrial fibrillation ([place]) 12/24/2014  . Essential hypertension 12/24/2014  . Chronic kidney disease 12/24/2014  . PUD (peptic ulcer disease) 12/24/2014     Current Outpatient Medications on File Prior to Visit  Medication Sig Dispense Refill    . Accu-Chek FastClix Lancets MISC Use one strip to monitor glucose levels BID; E11.42 102 each 11  . albuterol (PROVENTIL HFA;VENTOLIN HFA) 108 (90 Base) MCG/ACT inhaler Inhale 2 puffs into the lungs every 6 (six) hours as needed for wheezing or shortness of breath. (Patient not taking: Reported on 12/08/2019) 1 Inhaler 0  . allopurinol (ZYLOPRIM) 100 MG tablet Take 200 mg by mouth every evening.     [name] atorvastatin (LIPITOR) 40 MG tablet Take 1 tablet (40 mg total) by mouth daily. 90 tablet 2  . carvedilol (COREG) 12.5 MG tablet Take 1 tablet (12.5 mg total) by mouth 2 (two) times daily with a meal. 180 tablet 1  . cefdinir (OMNICEF) 300 MG capsule Take 1 capsule (300 mg total) by mouth every 12 (twelve) hours for 6 days. 12 capsule 0  . diclofenac Sodium (VOLTAREN) 1 % GEL Apply 4 g topically 4 (four) times daily. (Patient not taking: Reported on 12/08/2019) 100 g 0  . DULoxetine (CYMBALTA) 20 MG capsule Take 1 capsule by mouth once daily 90 capsule 1  . EQ ASPIRIN ADULT LOW DOSE 81 MG EC tablet TAKE 1 TABLET BY MOUTH ONCE DAILY. SWALLOW WHOLE (Patient taking differently: Take 81 mg by mouth at bedtime. ) 90 tablet 0  . famotidine (PEPCID) 20 MG tablet Take 20 mg by mouth 2 (two) times daily.    . fluticasone (FLONASE) 50 MCG/ACT nasal spray Place 2 sprays into both nostrils daily. (Patient not taking: Reported on 12/08/2019) 16 g 6  . furosemide (LASIX) 40 MG tablet Take 1 tablet (40 mg total) by mouth 2 (two) times daily. 180 tablet 3  . glucose blood (ACCU-CHEK GUIDE) test strip Use one strip to monitor glucose levels BID; E11.42 100 each 12  . hydrALAZINE (APRESOLINE) 50 MG tablet TAKE 1 TABLET BY MOUTH EVERY 8 HOURS 90 tablet 2  . Insulin Lispro Prot & Lispro (HUMALOG MIX 75/25 KWIKPEN) (75-25) 100 UNIT/ML Kwikpen Inject 150 Units into the skin daily with breakfast. (Patient taking differently: Inject 160 Units into the skin daily with breakfast. ) 20 pen 11  . Insulin Pen Needle (PEN NEEDLES)  31G X 6 MM MISC 1 each by Does not apply route daily. E11.9 90 each 0  . isosorbide mononitrate (IMDUR) 30 MG 24 hr tablet Take 1 tablet (30 mg total) by mouth daily. 90 tablet 3  . methocarbamol (ROBAXIN) 750 MG tablet Take 750 mg by mouth every 12 (twelve) hours.     [name] oxyCODONE (OXY IR/ROXICODONE) 5 MG immediate release tablet Take 5 mg by mouth every 6 (six) hours.    . potassium chloride SA (KLOR-CON) 20 MEQ tablet Take 20 mEq by mouth daily.    . predniSONE (DELTASONE) 5 MG tablet Take 5 mg by mouth daily.    . pregabalin (LYRICA) 100 MG capsule Take 100 mg by mouth 3 (three) times daily.     . sacubitril-valsartan (ENTRESTO) 97-103 MG Take 1 tablet by mouth 2 (two) times daily. 180 tablet 3  . Secukinumab (COSENTYX) 150 MG/ML SOSY Inject 150 mg into the skin every 28 (twenty-eight) days.     . Tafamidis (VYNDAMAX) 61  MG CAPS Take 61 mg by mouth daily. 30 capsule 11  . zolpidem (AMBIEN CR) 6.25 MG CR tablet TAKE 1 TABLET BY MOUTH EVERY DAY AT BEDTIME AS NEEDED FOR SLEEP (Patient taking differently: Take 6.25 mg by mouth at bedtime. ) 30 tablet 0   No current facility-administered medications on file prior to visit.    Allergies  Allergen Reactions  . Nsaids Other (See Comments)    Stomach pains. Ulcers - stated by patient     Social History   Socioeconomic History  . Marital status: Married    Spouse name: Amethyst  . Number of children: 8  . Years of education: 72  . Highest education level: Some college, no degree  Occupational History    Comment: disabled  Tobacco Use  . Smoking status: Former Smoker    Packs/day: 0.50    Years: 10.00    Pack years: 5.00    Types: Cigarettes    Quit date: 04/11/2008    Years since quitting: 11.6  . Smokeless tobacco: Never Used  Substance and Sexual Activity  . Alcohol use: No    Alcohol/week: 0.0 standard drinks  . Drug use: No  . Sexual activity: Yes    Partners: Female    Birth control/protection: None  Other Topics Concern   . Not on file  Social History Narrative   Lives with wife. Does not work.  On disability.    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 Provided crisis assistance program information to assist with bills       Has issues getting food- gets $36/month in food stamps- already has list of food pantries but has not tried them- encouraged pt to try food pantries and reach out to clinic for help if needed   Social Determinants of Health   Financial Resource Strain:   . Difficulty of Paying Living Expenses:   Food Insecurity:   . Worried About Charity fundraiser in the Last Year:   . Arboriculturist in the Last Year:   Transportation Needs:   . Film/video editor (Medical):   [name] Lack of Transportation (Non-Medical):   Physical Activity:   . Days of Exercise per Week:   . Minutes of Exercise per Session:   Stress:   . Feeling of Stress :   Social Connections:   . Frequency of Communication with Friends and Family:   . Frequency of Social Gatherings with Friends and Family:   . Attends Religious Services:   . Active Member of Clubs or Organizations:   . Attends Archivist Meetings:   [name] Marital Status:   Intimate Partner Violence:   . Fear of Current or Ex-Partner:   . Emotionally Abused:   [name] Physically Abused:   . Sexually Abused:     Family History  Problem Relation Age of Onset  . Hypertension Mother   . Diabetes Mother   . COPD Mother   . Lung cancer Mother        Smoker   . Hypertension Father   . Diabetes Father   . Heart Problems Father   . COPD Father   . Colon cancer Neg Hx   . Stomach cancer Neg Hx     Past Surgical History:  Procedure Laterality  Date  . CARDIAC CATHETERIZATION  05/2014   ablation for atrial fibrillation  . CARDIAC CATHETERIZATION N/A 11/16/2015    Procedure: Left Heart Cath and Coronary Angiography;  Surgeon: Leonie Man, MD;  Location: [facility];  Service: Cardiovascular;  Laterality: N/A;  . COLON RESECTION  09/2013   due to large, abnormal polpy. non cancerous per patient.   . COLON SURGERY  09/2014   colon resection   . LEFT HEART CATH AND CORONARY ANGIOGRAPHY N/A 05/19/2018   Procedure: LEFT HEART CATH AND CORONARY ANGIOGRAPHY;  Surgeon: Wellington Hampshire, MD;  Location: [facility];  Service: Cardiovascular;  Laterality: N/A;    ROS: Review of Systems Negative except as stated above  PHYSICAL EXAM: BP (!) 75/44   Pulse 77   Temp (!) 97 F (36.1 C)   Resp 16   Wt 242 lb 3.2 oz (109.9 kg)   SpO2 93%   BMI 39.09 kg/m   Wt Readings from Last 3 Encounters:  12/15/19 242 lb 3.2 oz (109.9 kg)  12/13/19 246 lb 11.2 oz (111.9 kg)  11/17/19 240 lb (108.9 kg)    Physical Exam  General appearance -patient appears not well.  He is in a wheelchair.  He is noted to have mild tremors especially in the right upper extremity Mental status -he is answering questions appropriately. Mouth - mucous membranes moist, pharynx normal without lesions Chest - clear to auscultation, no wheezes, rales or rhonchi, symmetric air entry Heart -regular rate rhythm Extremities -1+ bilateral lower extremity edema Skin: No skin breakdown noted on the buttocks.  He has mild tenderness on palpation of lower buttock both sides.  No erythema seen. CMP Latest Ref Rng & Units 12/13/2019 12/12/2019 12/12/2019  Glucose 70 - 99 mg/dL 124(H) 96 -  BUN 8 - 23 mg/dL 29(H) 42(H) -  Creatinine 0.61 - 1.24 mg/dL 1.67(H) 2.27(H) 2.24(H)  Sodium 135 - 145 mmol/L 140 142 -  Potassium 3.5 - 5.1 mmol/L 4.2 4.3 -  Chloride 98 - 111 mmol/L 111 114(H) -  CO2 22 - 32 mmol/L 23 22 -  Calcium 8.9 - 10.3 mg/dL 8.0(L) 7.8(L) -  Total Protein 6.5 - 8.1 g/dL - - -  Total Bilirubin 0.3 - 1.2 mg/dL - - -  Alkaline Phos 38 - 126 U/L - - -  AST 15 - 41 U/L - - -   ALT 0 - 44 U/L - - -   Lipid Panel     Component Value Date/Time   CHOL 126 04/22/2019 1121   TRIG 134 04/22/2019 1121   HDL 32 (L) 04/22/2019 1121   CHOLHDL 3.9 04/22/2019 1121   CHOLHDL 2.6 06/10/2016 1253   VLDL 18 06/10/2016 1253   LDLCALC 70 04/22/2019 1121    CBC    Component Value Date/Time   WBC 14.5 (H) 12/13/2019 0513   RBC 3.44 (L) 12/13/2019 0513   HGB 10.2 (L) 12/13/2019 0513   HGB 13.2 04/22/2019 1121   HCT 32.9 (L) 12/13/2019 0513   HCT 41.1 04/22/2019 1121   PLT 254 12/13/2019 0513   PLT 199 04/22/2019 1121   MCV 95.6 12/13/2019 0513   MCV 92 04/22/2019 1121   MCH 29.7 12/13/2019 0513   MCHC 31.0 12/13/2019 0513   RDW 15.8 (H) 12/13/2019 0513   RDW 14.0 04/22/2019 1121   LYMPHSABS 0.6 (L) 12/10/2019 0500   LYMPHSABS 1.8 10/20/2018 1631   MONOABS 1.2 (H) 12/10/2019 0500   EOSABS 0.0 12/10/2019 0500  EOSABS 0.1 10/20/2018 1631   BASOSABS 0.0 12/10/2019 0500   BASOSABS 0.1 10/20/2018 1631    ASSESSMENT AND PLAN: 1. Hospital discharge follow-up 2. Symptomatic hypotension -Given his constellation of symptoms and physical exam findings including symptomatic hypotension, decreased mobility, poor oral intake, shortness of breath with lower extremity edema.  I recommend that he be seen in the emergency room.  3. Type 2 diabetes mellitus with hypoglycemia without coma, with long-term current use of insulin (HCC) Repeat blood sugar today was 97.  Advised patient that he may need to cut back on the insulin until his oral intake is better established.  He tells me he is taking 150 units daily of the Humalog 75/25.  I recommend decreasing to 140 units until he is eating normally - POCT glucose (manual entry) - POCT glucose (manual entry)  4. Poor mobility Would likely benefit from some home physical therapy once discharged from the ER or the hospital  5. Pain in buttock Questionable etiology.  We may have to pursue imaging  6. Pyelonephritis Complete  course of antibiotics which she was discharged on.  Patient was given the opportunity to ask questions.  Patient verbalized understanding of the plan and was able to repeat key elements of the plan.   Orders Placed This Encounter  Procedures  . POCT glucose (manual entry)     Requested Prescriptions    No prescriptions requested or ordered in this encounter    Return in about 1 week (around 12/22/2019).  Karle Plumber, MD, FACP

## 2019-12-15 NOTE — ED Notes (Signed)
Per PCP, sending patient due to low CBG, low BP, SOB-decreased PO intake-recently hospitalized for urosepsis

## 2019-12-16 ENCOUNTER — Encounter (HOSPITAL_COMMUNITY): Payer: Self-pay | Admitting: Internal Medicine

## 2019-12-16 ENCOUNTER — Ambulatory Visit (HOSPITAL_COMMUNITY)
Admission: RE | Admit: 2019-12-16 | Discharge: 2019-12-16 | Disposition: A | Discharge status: Home / Self Care | Payer: Medicaid Other | Source: Ambulatory Visit | Attending: Internal Medicine | Admitting: Internal Medicine

## 2019-12-16 VITALS — BP 98/52 | HR 73 | Wt 243.0 lb

## 2019-12-16 DIAGNOSIS — Z886 Allergy status to analgesic agent status: Secondary | ICD-10-CM | POA: Diagnosis not present

## 2019-12-16 DIAGNOSIS — Z86010 Personal history of colonic polyps: Secondary | ICD-10-CM | POA: Insufficient documentation

## 2019-12-16 DIAGNOSIS — Z8249 Family history of ischemic heart disease and other diseases of the circulatory system: Secondary | ICD-10-CM | POA: Diagnosis not present

## 2019-12-16 DIAGNOSIS — K219 Gastro-esophageal reflux disease without esophagitis: Secondary | ICD-10-CM | POA: Insufficient documentation

## 2019-12-16 DIAGNOSIS — E119 Type 2 diabetes mellitus without complications: Secondary | ICD-10-CM | POA: Diagnosis present

## 2019-12-16 DIAGNOSIS — Z6839 Body mass index (BMI) 39.0-39.9, adult: Secondary | ICD-10-CM | POA: Diagnosis not present

## 2019-12-16 DIAGNOSIS — Z7952 Long term (current) use of systemic steroids: Secondary | ICD-10-CM | POA: Diagnosis not present

## 2019-12-16 DIAGNOSIS — I5042 Chronic combined systolic (congestive) and diastolic (congestive) heart failure: Secondary | ICD-10-CM | POA: Insufficient documentation

## 2019-12-16 DIAGNOSIS — I5022 Chronic systolic (congestive) heart failure: Secondary | ICD-10-CM

## 2019-12-16 DIAGNOSIS — E854 Organ-limited amyloidosis: Secondary | ICD-10-CM | POA: Diagnosis not present

## 2019-12-16 DIAGNOSIS — Z833 Family history of diabetes mellitus: Secondary | ICD-10-CM | POA: Diagnosis not present

## 2019-12-16 DIAGNOSIS — Z794 Long term (current) use of insulin: Secondary | ICD-10-CM | POA: Insufficient documentation

## 2019-12-16 DIAGNOSIS — Z87891 Personal history of nicotine dependence: Secondary | ICD-10-CM | POA: Insufficient documentation

## 2019-12-16 DIAGNOSIS — Z7982 Long term (current) use of aspirin: Secondary | ICD-10-CM | POA: Insufficient documentation

## 2019-12-16 DIAGNOSIS — I130 Hypertensive heart and chronic kidney disease with heart failure and stage 1 through stage 4 chronic kidney disease, or unspecified chronic kidney disease: Secondary | ICD-10-CM | POA: Diagnosis not present

## 2019-12-16 DIAGNOSIS — I2510 Atherosclerotic heart disease of native coronary artery without angina pectoris: Secondary | ICD-10-CM | POA: Insufficient documentation

## 2019-12-16 DIAGNOSIS — I480 Paroxysmal atrial fibrillation: Secondary | ICD-10-CM

## 2019-12-16 DIAGNOSIS — I43 Cardiomyopathy in diseases classified elsewhere: Secondary | ICD-10-CM | POA: Diagnosis not present

## 2019-12-16 DIAGNOSIS — Z801 Family history of malignant neoplasm of trachea, bronchus and lung: Secondary | ICD-10-CM | POA: Insufficient documentation

## 2019-12-16 DIAGNOSIS — Z79899 Other long term (current) drug therapy: Secondary | ICD-10-CM | POA: Diagnosis not present

## 2019-12-16 DIAGNOSIS — I10 Essential (primary) hypertension: Secondary | ICD-10-CM | POA: Diagnosis not present

## 2019-12-16 DIAGNOSIS — I428 Other cardiomyopathies: Secondary | ICD-10-CM | POA: Insufficient documentation

## 2019-12-16 DIAGNOSIS — G4733 Obstructive sleep apnea (adult) (pediatric): Secondary | ICD-10-CM | POA: Diagnosis not present

## 2019-12-16 DIAGNOSIS — N183 Chronic kidney disease, stage 3 unspecified: Secondary | ICD-10-CM | POA: Diagnosis not present

## 2019-12-16 DIAGNOSIS — Z825 Family history of asthma and other chronic lower respiratory diseases: Secondary | ICD-10-CM | POA: Diagnosis not present

## 2019-12-16 DIAGNOSIS — M069 Rheumatoid arthritis, unspecified: Secondary | ICD-10-CM | POA: Diagnosis not present

## 2019-12-16 DIAGNOSIS — N1831 Chronic kidney disease, stage 3a: Secondary | ICD-10-CM | POA: Diagnosis not present

## 2019-12-16 DIAGNOSIS — E1122 Type 2 diabetes mellitus with diabetic chronic kidney disease: Secondary | ICD-10-CM | POA: Diagnosis not present

## 2019-12-16 LAB — URINE CULTURE: Culture: NO GROWTH

## 2019-12-16 MED ORDER — FUROSEMIDE 40 MG PO TABS: 40.0000 mg | ORAL_TABLET | Freq: Every day | ORAL | 3 refills | Status: DC

## 2019-12-16 MED ORDER — ENTRESTO 49-51 MG PO TABS
1.0000 | ORAL_TABLET | Freq: Two times a day (BID) | ORAL | 3 refills | Status: DC
Start: 2019-12-16 — End: 2020-01-14

## 2019-12-16 NOTE — Progress Notes (Signed)
Advanced Heart Failure Clin Note   Referring Physician: [name], [name]. PCP: [name], MD PCP-Cardiologist: [name], MD  HF: [name]   HPI:  Marco [name] is a 64 y.o. 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  He was seen by [name] in 03/2018 for SOB and found to have a drop in EF to 35-40% with inferior hypokinesis. Cardiac cath 05/19/18 showed mild nonobstructive CAD  LVEDP 28, EF 25-30%.   PYP 11/19  (grade 2, H/CLL equal 1.94) are strongly suggestive of transthyretin amyloidosis. However on my review, did not seem so positive. Myleloma panel with MGUS, discussed with [name] who recommend follow up labwork for common, non-pathologic findings. Did not think he had myeloma  Repeat PYP 12/20 H/CLL = 1.10  The study is equivocal. The study is equivocal for TTR amyloidosis (visual score of 1 or H/CL ratio 1-1.5).   Echo 2/20 EF 40-45% RV normal   Echo 3/21 EF 55-60%   Here for routine f/u. Admitted 12/08/19-12/13/19 for urosepsis and AKI with creatinine 1.5 -> 5.6. Meds held. Hydrated and creatinine 1.7 on d/c. Says he feels ok. Now back on lasix. Hydralazine on hold with low BP. Feels weak. SOB with minimal activity. Taking lasix 40 bid.    Studies:   [facility] 11/2015 showed minimal CAD, mild calcification noted extraluminally, normal LVF, normal LVEDP. CPX 06/2016 with limited interpretation due to submaximal effort during exercise, difficult to interpret, also of note was very intolerant to discomfort from several processes during the CPX procedure today  (i.e. motion on bike, electrode removal, etc).  - Echo 09/03/16 EF 45-50% with global HK, grade 2 DD, elevated LV filling pressure. CTA 09/02/16 was negative for PE.  - Nuclear stress test 12/2016 was done for atypical CP showing small defect of severe severity in apical septal and apex location, no ischemia, EF 44% - suspected artifact.  - Echo 01/20/17 EF 50-55%, hypokinesis of apical inferoseptal myocardium, grade 1 diastolic dysfunction, mild MR, no sig change from prior.  - Event monitor 12/2016 showed NSR with occasional PVCs, otherwise benign.  - ETT 02/2017 was nondiagnostic due to 20 seconds of exercise and severe SOB. -Cardiac cath 05/19/18 showed mild nonobstructive CAD (see below), mod-severely elevated LVEDP 28, EF 25-30%. - Echo 05/19/18 LVEF 25-30%, Grade 1 DD, Trivial MR.  - PYP 11/19 Visual and quantitative assessment (grade 2, H/CLL equal 1.94) are strongly suggestive of transthyretin amyloidosis.  LHC 05/19/18  - Ost 2nd Diag lesion is 35% stenosed.  Prox LAD to Mid LAD lesion is 20% stenosed.  Ost Cx to Prox Cx lesion is 10% stenosed.  There is severe left ventricular systolic dysfunction.  LV end diastolic pressure is moderately elevated. The left ventricular ejection fraction is 25-35% by visual estimate. - LVEDP 28  CPX 06/19/2016 - Limited due to submaximal effort and poor amount of information.  FVC 2.35 (65%)    FEV1 1.94 (66%)     FEV1/FVC 82 (102%)     MVV 117 (86%) Post-Exercise PFTs--------- Not performed due to sudden termination of exercise Exercise Time:  1:56      Watts: 10  RPE: 17 Resting HR: 70 Peak HR: 84  (50% age predicted max HR) BP rest: 148/82 BP peak: 156/84 Peak VO2: 9.7 (50% predicted peak VO2) VE/VCO2 slope: 30 OUES: 1.34 Peak RER: 0.97 Ventilatory Threshold: CANNOT BE DETERMINED Peak RR 78 Peak Ventilation: 28.8 VE/MVV: 25% PETCO2 at peak: 36 O2pulse: 11  (79% predicted O2pulse)   Review of systems complete  and found to be negative unless listed in HPI.    Past Medical History:  Diagnosis Date  . Benign colon polyp 08/01/2013   [facility] in [place]. large base tranverse colon polyp was biopsied. polyp was benign with minimal surface hyperplastic change.  . Chronic combined systolic and diastolic CHF (congestive heart failure) ([place])   . Chronic pain   . CKD (chronic kidney disease), stage III   . Diabetes mellitus without complication ([place])   . Diverticulosis   . Esophageal hiatal hernia 07/29/2013   confirmed on EGD   . Esophageal stricture   . Essential hypertension   . Gastritis 07/29/2013   confirmed on EGD, bx done an negative for intestinal metaplasia, dsyplasia or H. pylori. normal gastric emptying study done 07/13/2013.  [name] GERD (gastroesophageal reflux disease)   . Morbid obesity ([place])   . Non-obstructive CAD    a. 02/2013 Cath ([place]): nonobs dzs;  b. 09/2014 Myoview ([place]): EF 55%, no ischemia;  c. Cath 05/2018 mild CAD no obstruction.  . Persistent atrial fibrillation ([place])    a. 02/2013 s/p rfca in [place], [place]-->prev on Xarelto, d/c'd 2/2 anemia, ? GIB.  [name] Rheumatoid arthritis ([place])   . Sigmoid diverticulosis 08/01/2013   confirmed on colonscopy. record scanned into chart    Current Outpatient Medications  Medication Sig Dispense Refill  . Accu-Chek FastClix Lancets MISC Use one strip to monitor glucose levels BID; E11.42 102 each 11  . albuterol (PROVENTIL HFA;VENTOLIN HFA) 108 (90 Base) MCG/ACT inhaler Inhale 2 puffs into the lungs every 6 (six) hours as needed for wheezing or shortness of breath. 1 Inhaler 0  . allopurinol (ZYLOPRIM) 100 MG tablet Take 200 mg by mouth every evening.     [name] atorvastatin (LIPITOR) 40 MG tablet Take 1 tablet (40 mg total) by mouth daily. 90 tablet 2  . carvedilol (COREG) 12.5 MG tablet Take 1 tablet (12.5 mg total) by mouth 2 (two) times daily with a meal. 180 tablet 1  . cefdinir (OMNICEF) 300 MG capsule Take 1 capsule (300 mg  total) by mouth every 12 (twelve) hours for 6 days. 12 capsule 0  . diclofenac Sodium (VOLTAREN) 1 % GEL Apply 4 g topically 4 (four) times daily. 100 g 0  . DULoxetine (CYMBALTA) 20 MG capsule Take 1 capsule by mouth once daily (Patient taking differently: Take 20 mg by mouth daily. ) 90 capsule 1  . EQ ASPIRIN ADULT LOW DOSE 81 MG EC tablet TAKE 1 TABLET BY MOUTH ONCE DAILY. SWALLOW WHOLE (Patient taking differently: Take 81 mg by mouth at bedtime. ) 90 tablet 0  . famotidine (PEPCID) 20 MG tablet Take 20 mg by mouth 2 (two) times daily.    . furosemide (LASIX) 40 MG tablet Take 1 tablet (40 mg total) by mouth 2 (two) times daily. 180 tablet 3  . glucose blood (ACCU-CHEK GUIDE) test strip Use one strip to monitor glucose levels BID; E11.42 100 each 12  . Insulin Lispro Prot & Lispro (HUMALOG MIX 75/25 KWIKPEN) (75-25) 100 UNIT/ML Kwikpen Inject 150 Units into the skin daily with breakfast. (Patient taking  differently: Inject 160 Units into the skin daily with breakfast. ) 20 pen 11  . Insulin Pen Needle (PEN NEEDLES) 31G X 6 MM MISC 1 each by Does not apply route daily. E11.9 90 each 0  . isosorbide mononitrate (IMDUR) 30 MG 24 hr tablet Take 1 tablet (30 mg total) by mouth daily. 90 tablet 3  . oxyCODONE (OXY IR/ROXICODONE) 5 MG immediate release tablet Take 5 mg by mouth every 6 (six) hours.    . potassium chloride SA (KLOR-CON) 20 MEQ tablet Take 20 mEq by mouth daily.    . predniSONE (DELTASONE) 5 MG tablet Take 5 mg by mouth daily.    . pregabalin (LYRICA) 100 MG capsule Take 100 mg by mouth 3 (three) times daily.     . sacubitril-valsartan (ENTRESTO) 97-103 MG Take 1 tablet by mouth 2 (two) times daily. 180 tablet 3  . Secukinumab (COSENTYX) 150 MG/ML SOSY Inject 150 mg into the skin every 28 (twenty-eight) days.     . Tafamidis (VYNDAMAX) 61 MG CAPS Take 61 mg by mouth daily. 30 capsule 11  . zolpidem (AMBIEN CR) 6.25 MG CR tablet TAKE 1 TABLET BY MOUTH EVERY DAY AT BEDTIME AS NEEDED FOR  SLEEP (Patient taking differently: Take 6.25 mg by mouth at bedtime. ) 30 tablet 0  . fluticasone (FLONASE) 50 MCG/ACT nasal spray Place 2 sprays into both nostrils daily. (Patient not taking: Reported on 12/08/2019) 16 g 6  . hydrALAZINE (APRESOLINE) 50 MG tablet TAKE 1 TABLET BY MOUTH EVERY 8 HOURS (Patient not taking: No sig reported) 90 tablet 2   No current facility-administered medications for this encounter.    Allergies  Allergen Reactions  . Nsaids Other (See Comments)    Stomach pains. Ulcers - stated by patient       Social History   Socioeconomic History  . Marital status: Married    Spouse name: Amethyst  . Number of children: 8  . Years of education: 10  . Highest education level: Some college, no degree  Occupational History    Comment: disabled  Tobacco Use  . Smoking status: Former Smoker    Packs/day: 0.50    Years: 10.00    Pack years: 5.00    Types: Cigarettes    Quit date: 04/11/2008    Years since quitting: 11.6  . Smokeless tobacco: Never Used  Substance and Sexual Activity  . Alcohol use: No    Alcohol/week: 0.0 standard drinks  . Drug use: No  . Sexual activity: Yes    Partners: Female    Birth control/protection: None  Other Topics Concern  . Not on file  Social History Narrative   Lives with wife. Does not work.  On disability.    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 Provided crisis assistance program information to assist with bills       Has issues getting food- gets $36/month in food stamps- already has list of food pantries but has not tried them- encouraged pt to try food pantries and reach out to clinic for help if needed   Social Determinants of Health   Financial Resource Strain:   . Difficulty of Paying Living Expenses:   Food Insecurity:   .  Worried About Charity fundraiser in the Last Year:   .  Ran Out of Food in the Last Year:   Transportation Needs:   . Film/video editor (Medical):   [name] Lack of Transportation (Non-Medical):   Physical Activity:   . Days of Exercise per Week:   . Minutes of Exercise per Session:   Stress:   . Feeling of Stress :   Social Connections:   . Frequency of Communication with Friends and Family:   . Frequency of Social Gatherings with Friends and Family:   . Attends Religious Services:   . Active Member of Clubs or Organizations:   . Attends Archivist Meetings:   [name] Marital Status:   Intimate Partner Violence:   . Fear of Current or Ex-Partner:   . Emotionally Abused:   [name] Physically Abused:   . Sexually Abused:       Family History  Problem Relation Age of Onset  . Hypertension Mother   . Diabetes Mother   . COPD Mother   . Lung cancer Mother        Smoker   . Hypertension Father   . Diabetes Father   . Heart Problems Father   . COPD Father   . Colon cancer Neg Hx   . Stomach cancer Neg Hx     Vitals:   12/16/19 1357  BP: (!) 98/52  Pulse: 73  SpO2: 93%  Weight: 110.2 kg (243 lb)    Wt Readings from Last 3 Encounters:  12/16/19 110.2 kg (243 lb)  12/15/19 109.9 kg (242 lb 3.2 oz)  12/13/19 111.9 kg (246 lb 11.2 oz)     PHYSICAL EXAM: General:  Weak appearing. No resp difficulty HEENT: normal Neck: supple. no JVD. Carotids 2+ bilat; no bruits. No lymphadenopathy or thryomegaly appreciated. Cor: PMI nondisplaced. Regular rate & rhythm. No rubs, gallops or murmurs. Lungs: clear Abdomen: soft, nontender, nondistended. No hepatosplenomegaly. No bruits or masses. Good bowel sounds. Extremities: no cyanosis, clubbing, rash, edema Neuro: alert & orientedx3, cranial nerves grossly intact. moves all 4 extremities w/o difficulty. Affect pleasant    ASSESSMENT & PLAN:  1. Chronic combined systolic and diastolic CHF  - Echo [id number] LVEF 25-30%, Grade 1  DD, Trivial MR.  - Echo 2/20 40-45%  RV ok. No need for ICD currently.  - Echo 3/21 EF 55-60% -> complete recovery - Stable NYHA II-early III symptoms.  - Volume status looks dry and BP low.  - Very mild non-obstructive CAD on cath 05/19/18,  NICM  - PYP scan 11/19 read as + for Cardiac amyloidosis. But when I reviewed I am not [id number] certain it is positive. Genetic testing negative. Myeloma Panel with MGUS, no myeloma. Now on Tafamadis.  - Repeat PYP 1/21. Reviewed personally. Read as equivocal. I feel it is negative. - Ideally, would get cMRI, but limited due to CKD and claustrophobia.  - Drop lasix to 40 daily - Decrease Entresto 97/103 bid -> 49/51 bid - Continue coreg to 12.5 mg BID  - Off hydralazine 50 mg TID - would not restart - Stop imdur 30 mg daily.  - Off Jardiance - Reinforced fluid restriction to < 2 L daily, sodium restriction to less than 2000 mg daily, and the importance of daily weights.   2. Cardiac Amyloidosis - By PYP scan 06/29/18. Grade 2, H/CLL equal 1.94. See discussion above - Genetic testing negative - Repeat PYP 1/21. Reviewed personally. Read as equivocal. I feel it is negative. - Has subsequently been started on tafamadis. May be able to stop 3. Nonobstructive  CAD  - By cath 05/2018 as above - No s/s angina - Continue ASA . Dr [name] managing lipids 4. CKD stage III - Creatinine  stable 1.7 today 5. History of atrial fibrillation  - s/p RFCA 2017. - Previously on Xarelto, stopped due to anemia and unknown blood loss. Should consider re-challenge.  - CHA2DS2/VASc is at least 4. 6. DM2 - Per PCP - Off Lottie Rater, MD 12/16/19

## 2019-12-16 NOTE — Patient Instructions (Signed)
Stop Hydralazine  Stop Isosorbide  Decrease Furosemide to 40 mg daily, can take extra as needed  Decrease Entresto to 49/51 mg Twice daily   Your physician recommends that you schedule a follow-up appointment in: 2 months  If you have any questions or concerns before your next appointment please send [place] a message through [place] or call our office at [phone].  At the [facility], you and your health needs are our priority. As part of our continuing mission to provide you with exceptional heart care, we have created designated Provider Care Teams. These Care Teams include your primary Cardiologist (physician) and Advanced Practice Providers (APPs- Physician Assistants and Nurse Practitioners) who all work together to provide you with the care you need, when you need it.   You may see any of the following providers on your designated Care Team at your next follow up: [name] Dr Glori Bickers . Dr Loralie Champagne . Darrick Grinder, NP . Lyda Jester, PA . Audry Riles, PharmD   Please be sure to bring in all your medications bottles to every appointment.

## 2019-12-19 ENCOUNTER — Other Ambulatory Visit: Payer: Self-pay

## 2019-12-20 ENCOUNTER — Ambulatory Visit: Payer: Medicaid Other | Admitting: Endocrinology

## 2019-12-20 ENCOUNTER — Encounter: Payer: Self-pay | Admitting: Endocrinology

## 2019-12-20 ENCOUNTER — Other Ambulatory Visit: Payer: Self-pay | Admitting: Internal Medicine

## 2019-12-20 VITALS — BP 110/80 | HR 75 | Ht 66.0 in | Wt 238.0 lb

## 2019-12-20 DIAGNOSIS — N183 Chronic kidney disease, stage 3 unspecified: Secondary | ICD-10-CM | POA: Diagnosis not present

## 2019-12-20 DIAGNOSIS — Z794 Long term (current) use of insulin: Secondary | ICD-10-CM

## 2019-12-20 DIAGNOSIS — E1122 Type 2 diabetes mellitus with diabetic chronic kidney disease: Secondary | ICD-10-CM | POA: Diagnosis not present

## 2019-12-20 DIAGNOSIS — E11649 Type 2 diabetes mellitus with hypoglycemia without coma: Secondary | ICD-10-CM

## 2019-12-20 LAB — CULTURE, BLOOD (ROUTINE X 2)
Culture: NO GROWTH
Culture: NO GROWTH
Special Requests: ADEQUATE
Special Requests: ADEQUATE

## 2019-12-20 LAB — GLUCOSE, POCT (MANUAL RESULT ENTRY)
POC Glucose: 49 mg/dl — AB (ref 70–99)
POC Glucose: 57 mg/dl — AB (ref 70–99)
POC Glucose: 78 mg/dl (ref 70–99)

## 2019-12-20 MED ORDER — INSULIN LISPRO PROT & LISPRO (75-25 MIX) 100 UNIT/ML KWIKPEN: 100.0000 [IU] | PEN_INJECTOR | Freq: Every day | SUBCUTANEOUS | 11 refills | Status: DC

## 2019-12-20 NOTE — Progress Notes (Signed)
Rechecked CBG per protocol. CBG = 57. Pt states he still feels dizzy and vision is slightly blurred. Speech no longer slurred and no longer feels hot to touch. Also noticed an improved focus on questions being asked. Provided pt with Coke and advised I would return in 15 minutes to recheck CBG per protocol. Verbalized acceptance and understanding.

## 2019-12-20 NOTE — Progress Notes (Signed)
Pt presents for DM f/u appt with [name]. While ambulating to exam room, pt states he feels very weak and dizzy. Pt also commented that he ate 2 hours ago, CBG at that time was 75 and then he states he took insulin as prescribed. Pt denied having any other symptoms. Once pt entered exam room, vitals and CBG were obtained. CBG = 49. Pt also felt very hot to the touch and was not able to fully focus on questions being asked. Speech slightly slurred although still alert and oriented x3. Provide pt with 3 cans of apple juice and advised I would recheck CBG in 15 minutes per protocol.

## 2019-12-20 NOTE — Progress Notes (Signed)
Rechecked CBG per protocol. CBG = 78. States all symptoms have improved but now has a headache. Informed [name] of pt condition at the time he presented for appointment, nursing interventions taken per protocol and current symptoms as well as CBG results. [name] advised pt will need another can of juice and another recheck in 15 minutes per protocol.

## 2019-12-20 NOTE — Progress Notes (Addendum)
Rechecked CBG per protocol. CBG = 98. Pt condition improved significantly. Denies feeling dizzy, fatigue, hot or having blurred vision. Also commented his headache has improved slightly. No longer noticed pt to have slurred speech. Was able to follow my commands as well as follow me with his eyes. Pt also recalled my name and thanked me for all my help. Declined needing juice to take home. States he is going straight home and wife will provide him with lunch. Education provided to monitor for hyperglycemic effects today and to call office for further instructions. Also provided education re: hypoglycemic effects AFTER today and strongly encouraged he call the office for further advice as well. Verbalized acceptance and understanding of all education provided. [name] made aware of pt condition as well CBG results. Agreed pt may be discharged.     Subjective:    Patient ID: Marco [name], male    DOB: [birth year], 64 y.o.   MRN: [id number]  HPI Pt returns for f/u of diabetes mellitus: DM type: Insulin-requiring type 2 Dx'ed: 123XX123 Complications: polyneuropathy, CAD, NPDR, and renal failure.   Therapy: insulin since soon after dx DKA: never Severe hypoglycemia: once (2020).  Pancreatitis: never.  Pancreatic imaging: never.  Other: he chronically takes prednisone, for psoriasis, with no plan for taper; he declined to continue multiple daily injections; due to renal failure (and fasting hypoglycemia), he takes 75/25 insulin.   Interval history: no cbg record, but states cbg's vary from 49-79.  It is in general higher as the day goes on, but there eis not much trend.  pt says he never misses the insulin.  No change in chronic prednisone dosage (5 mg qd).  He takes 140 units qam.    Past Medical History:  Diagnosis Date  . Benign colon polyp 08/01/2013   [facility] in [place]. large base tranverse colon polyp was biopsied. polyp was benign with minimal surface hyperplastic change.   . Chronic combined systolic and diastolic CHF (congestive heart failure) ([place])   . Chronic pain   . CKD (chronic kidney disease), stage III   . Diabetes mellitus without complication ([place])   . Diverticulosis   . Esophageal hiatal hernia 07/29/2013   confirmed on EGD   . Esophageal stricture   . Essential hypertension   . Gastritis 07/29/2013   confirmed on EGD, bx done an negative for intestinal metaplasia, dsyplasia or H. pylori. normal gastric emptying study done 07/13/2013.  [name] GERD (gastroesophageal reflux disease)   . Morbid obesity ([place])   . Non-obstructive CAD    a. 02/2013 Cath ([place]): nonobs dzs;  b. 09/2014 Myoview ([place]): EF 55%, no ischemia;  c. Cath 05/2018 mild CAD no obstruction.  . Persistent atrial fibrillation ([place])    a. 02/2013 s/p rfca in [place], [place]-->prev on Xarelto, d/c'd 2/2 anemia, ? GIB.  [name] Rheumatoid arthritis ([place])   . Sigmoid diverticulosis 08/01/2013   confirmed on colonscopy. record scanned into chart    Past Surgical History:  Procedure Laterality Date  . CARDIAC CATHETERIZATION  05/2014   ablation for atrial fibrillation  . CARDIAC CATHETERIZATION N/A 11/16/2015   Procedure: Left Heart Cath and Coronary Angiography;  Surgeon: Leonie Man, MD;  Location: [place] [facility];  Service: Cardiovascular;  Laterality: N/A;  . COLON RESECTION  09/2013   due to large, abnormal polpy. non cancerous per patient.   . COLON SURGERY  09/2014   colon resection   . LEFT HEART CATH AND CORONARY ANGIOGRAPHY N/A 05/19/2018  Procedure: LEFT HEART CATH AND CORONARY ANGIOGRAPHY;  Surgeon: Wellington Hampshire, MD;  Location: [facility] [facility];  Service: Cardiovascular;  Laterality: N/A;    Social History   Socioeconomic History  . Marital status: Married    Spouse name: Amethyst  . Number of children: 8  . Years of education: 2  . Highest education level: Some college, no degree  Occupational History    Comment: disabled  Tobacco Use  . Smoking status: Former  Smoker    Packs/day: 0.50    Years: 10.00    Pack years: 5.00    Types: Cigarettes    Quit date: 04/11/2008    Years since quitting: 11.7  . Smokeless tobacco: Never Used  Substance and Sexual Activity  . Alcohol use: No    Alcohol/week: 0.0 standard drinks  . Drug use: No  . Sexual activity: Yes    Partners: Female    Birth control/protection: None  Other Topics Concern  . Not on file  Social History Narrative   Lives with wife. Does not work.  On disability.    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 Provided crisis assistance program information to assist with bills       Has issues getting food- gets $36/month in food stamps- already has list of food pantries but has not tried them- encouraged pt to try food pantries and reach out to clinic for help if needed   Social Determinants of Health   Financial Resource Strain:   . Difficulty of Paying Living Expenses:   Food Insecurity:   . Worried About Charity fundraiser in the Last Year:   . Arboriculturist in the Last Year:   Transportation Needs:   . Film/video editor (Medical):   [name] Lack of Transportation (Non-Medical):   Physical Activity:   . Days of Exercise per Week:   . Minutes of Exercise per Session:   Stress:   . Feeling of Stress :   Social Connections:   . Frequency of Communication with Friends and Family:   . Frequency of Social Gatherings with Friends and Family:   . Attends Religious Services:   . Active Member of Clubs or Organizations:   . Attends Archivist Meetings:   [name] Marital Status:   Intimate Partner Violence:   . Fear of Current or Ex-Partner:   . Emotionally Abused:   [name] Physically Abused:   . Sexually Abused:     Current Outpatient Medications on File Prior to Visit  Medication Sig Dispense  Refill  . Accu-Chek FastClix Lancets MISC Use one strip to monitor glucose levels BID; E11.42 102 each 11  . albuterol (PROVENTIL HFA;VENTOLIN HFA) 108 (90 Base) MCG/ACT inhaler Inhale 2 puffs into the lungs every 6 (six) hours as needed for wheezing or shortness of breath. 1 Inhaler 0  . allopurinol (ZYLOPRIM) 100 MG tablet Take 200 mg by mouth every evening.     [name] atorvastatin (LIPITOR) 40 MG tablet Take 1 tablet (40 mg total) by mouth daily. 90 tablet 2  . carvedilol (COREG) 12.5 MG tablet Take 1 tablet (12.5 mg total) by mouth 2 (two) times daily with a meal. 180 tablet 1  . diclofenac Sodium (VOLTAREN) 1 % GEL Apply 4 g topically 4 (  four) times daily. 100 g 0  . EQ ASPIRIN ADULT LOW DOSE 81 MG EC tablet TAKE 1 TABLET BY MOUTH ONCE DAILY. SWALLOW WHOLE (Patient taking differently: Take 81 mg by mouth at bedtime. ) 90 tablet 0  . fluticasone (FLONASE) 50 MCG/ACT nasal spray Place 2 sprays into both nostrils daily. 16 g 6  . furosemide (LASIX) 40 MG tablet Take 1 tablet (40 mg total) by mouth daily. Can take extra as needed 90 tablet 3  . glucose blood (ACCU-CHEK GUIDE) test strip Use one strip to monitor glucose levels BID; E11.42 100 each 12  . Insulin Pen Needle (PEN NEEDLES) 31G X 6 MM MISC 1 each by Does not apply route daily. E11.9 90 each 0  . oxyCODONE (OXY IR/ROXICODONE) 5 MG immediate release tablet Take 5 mg by mouth every 6 (six) hours.    . potassium chloride SA (KLOR-CON) 20 MEQ tablet Take 20 mEq by mouth daily.    . predniSONE (DELTASONE) 5 MG tablet Take 5 mg by mouth daily.    . pregabalin (LYRICA) 100 MG capsule Take 100 mg by mouth 3 (three) times daily.     . sacubitril-valsartan (ENTRESTO) 49-51 MG Take 1 tablet by mouth 2 (two) times daily. 60 tablet 3  . Secukinumab (COSENTYX) 150 MG/ML SOSY Inject 150 mg into the skin every 28 (twenty-eight) days.     . Tafamidis (VYNDAMAX) 61 MG CAPS Take 61 mg by mouth daily. 30 capsule 11  . zolpidem (AMBIEN CR) 6.25 MG CR tablet TAKE  1 TABLET BY MOUTH EVERY DAY AT BEDTIME AS NEEDED FOR SLEEP (Patient taking differently: Take 6.25 mg by mouth at bedtime. ) 30 tablet 0   No current facility-administered medications on file prior to visit.    Allergies  Allergen Reactions  . Nsaids Other (See Comments)    Stomach pains. Ulcers - stated by patient     Family History  Problem Relation Age of Onset  . Hypertension Mother   . Diabetes Mother   . COPD Mother   . Lung cancer Mother        Smoker   . Hypertension Father   . Diabetes Father   . Heart Problems Father   . COPD Father   . Colon cancer Neg Hx   . Stomach cancer Neg Hx     BP 110/80   Pulse 75   Ht 5\' 6"  (1.676 m)   Wt 238 lb (108 kg)   SpO2 90%   BMI 38.41 kg/m    Review of Systems Denies LOC    Objective:   Physical Exam VITAL SIGNS:  See vs page GENERAL: no distress Pulses: dorsalis pedis intact bilat.   MSK: no deformity of the feet CV: no leg edema Skin:  no ulcer on the feet.  normal color and temp on the feet.  Neuro: sensation is intact to touch on the feet.         Assessment & Plan:  Type 2 DM, with CRI Hypoglycemia, due to insulin.  Pyelonephritis, new to me.  This is reducing insulin requirement.  It will prob increase back to pre-illness level over time.   Patient Instructions  check your blood sugar twice a day.  vary the time of day when you check, between before the 3 meals, and at bedtime.  also check if you have symptoms of your blood sugar being too high or too low.  please keep a record of the readings and bring it to your  next appointment here (or you can bring the meter itself).  You can write it on any piece of paper.  please call us sooner if your blood sugar goes below 70, or if you have a lot of readings over 200.   Please reduce the insulin to 100 units with breakfast.   On this type of insulin schedule, you should eat meals on a regular schedule (especially lunch).  If a meal is missed or significantly  delayed, your blood sugar could go low.    Please come back for a follow-up appointment in 2 weeks.

## 2019-12-20 NOTE — Patient Instructions (Addendum)
check your blood sugar twice a day.  vary the time of day when you check, between before the 3 meals, and at bedtime.  also check if you have symptoms of your blood sugar being too high or too low.  please keep a record of the readings and bring it to your next appointment here (or you can bring the meter itself).  You can write it on any piece of paper.  please call us sooner if your blood sugar goes below 70, or if you have a lot of readings over 200.   Please reduce the insulin to 100 units with breakfast.   On this type of insulin schedule, you should eat meals on a regular schedule (especially lunch).  If a meal is missed or significantly delayed, your blood sugar could go low.    Please come back for a follow-up appointment in 2 weeks.

## 2019-12-20 NOTE — Progress Notes (Deleted)
Subjective:    Patient ID: Marco [name], male    DOB: Dec 17, 1955, 64 y.o.   MRN: [id number]  HPI Pt returns for f/u of diabetes mellitus: DM type: Insulin-requiring type 2 Dx'ed: 123XX123 Complications: polyneuropathy, CAD, NPDR, and renal failure.   Therapy: insulin since soon after dx DKA: never Severe hypoglycemia: once (2020).  Pancreatitis: never.  Pancreatic imaging: never.  Other: he chronically takes prednisone, for psoriasis, with no plan for taper; he declined to continue multiple daily injections; due to renal failure (and fasting hypoglycemia), he takes 75/25 insulin.   Interval history: no cbg record, but states cbg's vary from 49-79.  It is in general higher as the day goes on, but there eis not much trend.  pt says he never misses the insulin.  No change in chronic prednisone dosage (5 mg qd).  He takes 140 units qam.    Past Medical History:  Diagnosis Date  . Benign colon polyp 08/01/2013   [facility] in [place]. large base tranverse colon polyp was biopsied. polyp was benign with minimal surface hyperplastic change.  . Chronic combined systolic and diastolic CHF (congestive heart failure) ([employer])   . Chronic pain   . CKD (chronic kidney disease), stage III   . Diabetes mellitus without complication ([place])   . Diverticulosis   . Esophageal hiatal hernia 07/29/2013   confirmed on EGD   . Esophageal stricture   . Essential hypertension   . Gastritis 07/29/2013   confirmed on EGD, bx done an negative for intestinal metaplasia, dsyplasia or H. pylori. normal gastric emptying study done 07/13/2013.  [name] GERD (gastroesophageal reflux disease)   . Morbid obesity ([place])   . Non-obstructive CAD    a. 02/2013 Cath ([place]): nonobs dzs;  b. 09/2014 Myoview ([place]): EF 55%, no ischemia;  c. Cath 05/2018 mild CAD no obstruction.  . Persistent atrial fibrillation ([place])    a. 02/2013 s/[place] rfca in [place], [place]-->prev on Xarelto, d/c'd 2/2 anemia, ? GIB.  [name] Rheumatoid arthritis  ([place])   . Sigmoid diverticulosis 08/01/2013   confirmed on colonscopy. record scanned into chart    Past Surgical History:  Procedure Laterality Date  . CARDIAC CATHETERIZATION  05/2014   ablation for atrial fibrillation  . CARDIAC CATHETERIZATION N/A 11/16/2015   Procedure: Left Heart Cath and Coronary Angiography;  Surgeon: Leonie Man, MD;  Location: [facility];  Service: Cardiovascular;  Laterality: N/A;  . COLON RESECTION  09/2013   due to large, abnormal polpy. non cancerous per patient.   . COLON SURGERY  09/2014   colon resection   . LEFT HEART CATH AND CORONARY ANGIOGRAPHY N/A 05/19/2018   Procedure: LEFT HEART CATH AND CORONARY ANGIOGRAPHY;  Surgeon: Wellington Hampshire, MD;  Location: [facility];  Service: Cardiovascular;  Laterality: N/A;    Social History   Socioeconomic History  . Marital status: Married    Spouse name: Amethyst  . Number of children: 8  . Years of education: 33  . Highest education level: Some college, no degree  Occupational History    Comment: disabled  Tobacco Use  . Smoking status: Former Smoker    Packs/day: 0.50    Years: 10.00    Pack years: 5.00    Types: Cigarettes    Quit date: 04/11/2008    Years since quitting: 11.7  . Smokeless tobacco: Never Used  Substance and Sexual Activity  . Alcohol use: No    Alcohol/week: 0.0 standard drinks  .  Drug use: No  . Sexual activity: Yes    Partners: Female    Birth control/protection: None  Other Topics Concern  . Not on file  Social History Narrative   Lives with wife. Does not work.  On disability.    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 Provided crisis assistance program information to assist with bills       Has issues getting food- gets $36/month in food stamps- already has list of food  pantries but has not tried them- encouraged pt to try food pantries and reach out to clinic for help if needed   Social Determinants of Health   Financial Resource Strain:   . Difficulty of Paying Living Expenses:   Food Insecurity:   . Worried About Charity fundraiser in the Last Year:   . Arboriculturist in the Last Year:   Transportation Needs:   . Film/video editor (Medical):   [name] Lack of Transportation (Non-Medical):   Physical Activity:   . Days of Exercise per Week:   . Minutes of Exercise per Session:   Stress:   . Feeling of Stress :   Social Connections:   . Frequency of Communication with Friends and Family:   . Frequency of Social Gatherings with Friends and Family:   . Attends Religious Services:   . Active Member of Clubs or Organizations:   . Attends Archivist Meetings:   [name] Marital Status:   Intimate Partner Violence:   . Fear of Current or Ex-Partner:   . Emotionally Abused:   [name] Physically Abused:   . Sexually Abused:     Current Outpatient Medications on File Prior to Visit  Medication Sig Dispense Refill  . Accu-Chek FastClix Lancets MISC Use one strip to monitor glucose levels BID; E11.42 102 each 11  . albuterol (PROVENTIL HFA;VENTOLIN HFA) 108 (90 Base) MCG/ACT inhaler Inhale 2 puffs into the lungs every 6 (six) hours as needed for wheezing or shortness of breath. 1 Inhaler 0  . allopurinol (ZYLOPRIM) 100 MG tablet Take 200 mg by mouth every evening.     [name] atorvastatin (LIPITOR) 40 MG tablet Take 1 tablet (40 mg total) by mouth daily. 90 tablet 2  . carvedilol (COREG) 12.5 MG tablet Take 1 tablet (12.5 mg total) by mouth 2 (two) times daily with a meal. 180 tablet 1  . diclofenac Sodium (VOLTAREN) 1 % GEL Apply 4 g topically 4 (four) times daily. 100 g 0  . EQ ASPIRIN ADULT LOW DOSE 81 MG EC tablet TAKE 1 TABLET BY MOUTH ONCE DAILY. SWALLOW WHOLE (Patient taking differently: Take 81 mg by mouth at bedtime. ) 90 tablet 0  . fluticasone  (FLONASE) 50 MCG/ACT nasal spray Place 2 sprays into both nostrils daily. 16 g 6  . furosemide (LASIX) 40 MG tablet Take 1 tablet (40 mg total) by mouth daily. Can take extra as needed 90 tablet 3  . glucose blood (ACCU-CHEK GUIDE) test strip Use one strip to monitor glucose levels BID; E11.42 100 each 12  . Insulin Pen Needle (PEN NEEDLES) 31G X 6 MM MISC 1 each by Does not apply route daily. E11.9 90 each 0  . oxyCODONE (OXY IR/ROXICODONE) 5 MG immediate release tablet Take 5 mg by mouth every  6 (six) hours.    . potassium chloride SA (KLOR-CON) 20 MEQ tablet Take 20 mEq by mouth daily.    . predniSONE (DELTASONE) 5 MG tablet Take 5 mg by mouth daily.    . pregabalin (LYRICA) 100 MG capsule Take 100 mg by mouth 3 (three) times daily.     . sacubitril-valsartan (ENTRESTO) 49-51 MG Take 1 tablet by mouth 2 (two) times daily. 60 tablet 3  . Secukinumab (COSENTYX) 150 MG/ML SOSY Inject 150 mg into the skin every 28 (twenty-eight) days.     . Tafamidis (VYNDAMAX) 61 MG CAPS Take 61 mg by mouth daily. 30 capsule 11  . zolpidem (AMBIEN CR) 6.25 MG CR tablet TAKE 1 TABLET BY MOUTH EVERY DAY AT BEDTIME AS NEEDED FOR SLEEP (Patient taking differently: Take 6.25 mg by mouth at bedtime. ) 30 tablet 0   No current facility-administered medications on file prior to visit.    Allergies  Allergen Reactions  . Nsaids Other (See Comments)    Stomach pains. Ulcers - stated by patient     Family History  Problem Relation Age of Onset  . Hypertension Mother   . Diabetes Mother   . COPD Mother   . Lung cancer Mother        Smoker   . Hypertension Father   . Diabetes Father   . Heart Problems Father   . COPD Father   . Colon cancer Neg Hx   . Stomach cancer Neg Hx     BP 110/80   Pulse 75   Ht 5\' 6"  (1.676 m)   Wt 238 lb (108 kg)   SpO2 90%   BMI 38.41 kg/m    Review of Systems Denies LOC    Objective:   Physical Exam VITAL SIGNS:  See vs page GENERAL: no distress Pulses: dorsalis  pedis intact bilat.   MSK: no deformity of the feet CV: no leg edema Skin:  no ulcer on the feet.  normal color and temp on the feet.  Neuro: sensation is intact to touch on the feet.         Assessment & Plan:  Type 2 DM, with CRI Hypoglycemia, due to insulin.  Pyelonephritis, new to me.  This is reducing insulin requirement.  It will prob increase back to pre-illness level over time.   Patient Instructions  check your blood sugar twice a day.  vary the time of day when you check, between before the 3 meals, and at bedtime.  also check if you have symptoms of your blood sugar being too high or too low.  please keep a record of the readings and bring it to your next appointment here (or you can bring the meter itself).  You can write it on any piece of paper.  please call us sooner if your blood sugar goes below 70, or if you have a lot of readings over 200.   Please reduce the insulin to 100 units with breakfast.   On this type of insulin schedule, you should eat meals on a regular schedule (especially lunch).  If a meal is missed or significantly delayed, your blood sugar could go low.    Please come back for a follow-up appointment in 2 weeks.

## 2019-12-21 ENCOUNTER — Telehealth (HOSPITAL_COMMUNITY): Payer: Self-pay | Admitting: Internal Medicine

## 2019-12-21 NOTE — Telephone Encounter (Signed)
Called and left message for pt to call back.  Need to r/s 02/16/20 appt w/APP, [place] will be on vacation.

## 2019-12-26 ENCOUNTER — Other Ambulatory Visit: Payer: Self-pay

## 2019-12-26 ENCOUNTER — Encounter (HOSPITAL_COMMUNITY): Payer: Self-pay

## 2019-12-26 DIAGNOSIS — Z6839 Body mass index (BMI) 39.0-39.9, adult: Secondary | ICD-10-CM

## 2019-12-26 DIAGNOSIS — Z794 Long term (current) use of insulin: Secondary | ICD-10-CM

## 2019-12-26 DIAGNOSIS — E873 Alkalosis: Secondary | ICD-10-CM | POA: Diagnosis not present

## 2019-12-26 DIAGNOSIS — I5042 Chronic combined systolic (congestive) and diastolic (congestive) heart failure: Secondary | ICD-10-CM | POA: Diagnosis present

## 2019-12-26 DIAGNOSIS — M1A9XX0 Chronic gout, unspecified, without tophus (tophi): Secondary | ICD-10-CM | POA: Diagnosis present

## 2019-12-26 DIAGNOSIS — I43 Cardiomyopathy in diseases classified elsewhere: Secondary | ICD-10-CM | POA: Diagnosis present

## 2019-12-26 DIAGNOSIS — R443 Hallucinations, unspecified: Secondary | ICD-10-CM | POA: Diagnosis not present

## 2019-12-26 DIAGNOSIS — B9620 Unspecified Escherichia coli [E. coli] as the cause of diseases classified elsewhere: Secondary | ICD-10-CM | POA: Diagnosis present

## 2019-12-26 DIAGNOSIS — L4050 Arthropathic psoriasis, unspecified: Secondary | ICD-10-CM | POA: Diagnosis present

## 2019-12-26 DIAGNOSIS — Z8249 Family history of ischemic heart disease and other diseases of the circulatory system: Secondary | ICD-10-CM

## 2019-12-26 DIAGNOSIS — L89310 Pressure ulcer of right buttock, unstageable: Secondary | ICD-10-CM | POA: Diagnosis present

## 2019-12-26 DIAGNOSIS — M069 Rheumatoid arthritis, unspecified: Secondary | ICD-10-CM | POA: Diagnosis present

## 2019-12-26 DIAGNOSIS — Z87891 Personal history of nicotine dependence: Secondary | ICD-10-CM

## 2019-12-26 DIAGNOSIS — I130 Hypertensive heart and chronic kidney disease with heart failure and stage 1 through stage 4 chronic kidney disease, or unspecified chronic kidney disease: Secondary | ICD-10-CM | POA: Diagnosis present

## 2019-12-26 DIAGNOSIS — D631 Anemia in chronic kidney disease: Secondary | ICD-10-CM | POA: Diagnosis present

## 2019-12-26 DIAGNOSIS — K449 Diaphragmatic hernia without obstruction or gangrene: Secondary | ICD-10-CM | POA: Diagnosis present

## 2019-12-26 DIAGNOSIS — E876 Hypokalemia: Secondary | ICD-10-CM | POA: Diagnosis not present

## 2019-12-26 DIAGNOSIS — Z886 Allergy status to analgesic agent status: Secondary | ICD-10-CM

## 2019-12-26 DIAGNOSIS — N500 Atrophy of testis: Secondary | ICD-10-CM | POA: Diagnosis present

## 2019-12-26 DIAGNOSIS — Z79899 Other long term (current) drug therapy: Secondary | ICD-10-CM

## 2019-12-26 DIAGNOSIS — J869 Pyothorax without fistula: Secondary | ICD-10-CM | POA: Diagnosis not present

## 2019-12-26 DIAGNOSIS — K5730 Diverticulosis of large intestine without perforation or abscess without bleeding: Secondary | ICD-10-CM | POA: Diagnosis present

## 2019-12-26 DIAGNOSIS — E1122 Type 2 diabetes mellitus with diabetic chronic kidney disease: Secondary | ICD-10-CM | POA: Diagnosis present

## 2019-12-26 DIAGNOSIS — F419 Anxiety disorder, unspecified: Secondary | ICD-10-CM | POA: Diagnosis present

## 2019-12-26 DIAGNOSIS — E11649 Type 2 diabetes mellitus with hypoglycemia without coma: Secondary | ICD-10-CM | POA: Diagnosis not present

## 2019-12-26 DIAGNOSIS — E669 Obesity, unspecified: Secondary | ICD-10-CM | POA: Diagnosis present

## 2019-12-26 DIAGNOSIS — Z87440 Personal history of urinary (tract) infections: Secondary | ICD-10-CM

## 2019-12-26 DIAGNOSIS — Z833 Family history of diabetes mellitus: Secondary | ICD-10-CM

## 2019-12-26 DIAGNOSIS — G8929 Other chronic pain: Secondary | ICD-10-CM | POA: Diagnosis present

## 2019-12-26 DIAGNOSIS — L89320 Pressure ulcer of left buttock, unstageable: Secondary | ICD-10-CM | POA: Diagnosis present

## 2019-12-26 DIAGNOSIS — Z7952 Long term (current) use of systemic steroids: Secondary | ICD-10-CM

## 2019-12-26 DIAGNOSIS — Z825 Family history of asthma and other chronic lower respiratory diseases: Secondary | ICD-10-CM

## 2019-12-26 DIAGNOSIS — Z1624 Resistance to multiple antibiotics: Secondary | ICD-10-CM | POA: Diagnosis present

## 2019-12-26 DIAGNOSIS — K760 Fatty (change of) liver, not elsewhere classified: Secondary | ICD-10-CM | POA: Diagnosis present

## 2019-12-26 DIAGNOSIS — K219 Gastro-esophageal reflux disease without esophagitis: Secondary | ICD-10-CM | POA: Diagnosis present

## 2019-12-26 DIAGNOSIS — N432 Other hydrocele: Secondary | ICD-10-CM | POA: Diagnosis present

## 2019-12-26 DIAGNOSIS — I4819 Other persistent atrial fibrillation: Secondary | ICD-10-CM | POA: Diagnosis present

## 2019-12-26 DIAGNOSIS — J90 Pleural effusion, not elsewhere classified: Secondary | ICD-10-CM | POA: Diagnosis present

## 2019-12-26 DIAGNOSIS — R296 Repeated falls: Secondary | ICD-10-CM | POA: Diagnosis present

## 2019-12-26 DIAGNOSIS — J320 Chronic maxillary sinusitis: Secondary | ICD-10-CM | POA: Diagnosis present

## 2019-12-26 DIAGNOSIS — E785 Hyperlipidemia, unspecified: Secondary | ICD-10-CM | POA: Diagnosis present

## 2019-12-26 DIAGNOSIS — J9811 Atelectasis: Secondary | ICD-10-CM | POA: Diagnosis present

## 2019-12-26 DIAGNOSIS — E854 Organ-limited amyloidosis: Secondary | ICD-10-CM | POA: Diagnosis present

## 2019-12-26 DIAGNOSIS — R0603 Acute respiratory distress: Secondary | ICD-10-CM | POA: Diagnosis not present

## 2019-12-26 DIAGNOSIS — G4733 Obstructive sleep apnea (adult) (pediatric): Secondary | ICD-10-CM | POA: Diagnosis present

## 2019-12-26 DIAGNOSIS — K5900 Constipation, unspecified: Secondary | ICD-10-CM | POA: Diagnosis not present

## 2019-12-26 DIAGNOSIS — I959 Hypotension, unspecified: Secondary | ICD-10-CM | POA: Diagnosis not present

## 2019-12-26 DIAGNOSIS — R0689 Other abnormalities of breathing: Secondary | ICD-10-CM | POA: Diagnosis not present

## 2019-12-26 DIAGNOSIS — Z79891 Long term (current) use of opiate analgesic: Secondary | ICD-10-CM

## 2019-12-26 DIAGNOSIS — E1142 Type 2 diabetes mellitus with diabetic polyneuropathy: Secondary | ICD-10-CM | POA: Diagnosis present

## 2019-12-26 DIAGNOSIS — J310 Chronic rhinitis: Secondary | ICD-10-CM | POA: Diagnosis present

## 2019-12-26 DIAGNOSIS — K4020 Bilateral inguinal hernia, without obstruction or gangrene, not specified as recurrent: Secondary | ICD-10-CM | POA: Diagnosis present

## 2019-12-26 DIAGNOSIS — Z20822 Contact with and (suspected) exposure to covid-19: Secondary | ICD-10-CM | POA: Diagnosis present

## 2019-12-26 DIAGNOSIS — N179 Acute kidney failure, unspecified: Secondary | ICD-10-CM | POA: Diagnosis present

## 2019-12-26 DIAGNOSIS — Z7982 Long term (current) use of aspirin: Secondary | ICD-10-CM

## 2019-12-26 DIAGNOSIS — R000 Tachycardia, unspecified: Secondary | ICD-10-CM | POA: Diagnosis not present

## 2019-12-26 DIAGNOSIS — K222 Esophageal obstruction: Secondary | ICD-10-CM | POA: Diagnosis present

## 2019-12-26 DIAGNOSIS — I248 Other forms of acute ischemic heart disease: Secondary | ICD-10-CM | POA: Diagnosis present

## 2019-12-26 DIAGNOSIS — I2510 Atherosclerotic heart disease of native coronary artery without angina pectoris: Secondary | ICD-10-CM | POA: Diagnosis present

## 2019-12-26 DIAGNOSIS — N281 Cyst of kidney, acquired: Secondary | ICD-10-CM | POA: Diagnosis present

## 2019-12-26 DIAGNOSIS — Z9049 Acquired absence of other specified parts of digestive tract: Secondary | ICD-10-CM

## 2019-12-26 DIAGNOSIS — N1832 Chronic kidney disease, stage 3b: Secondary | ICD-10-CM | POA: Diagnosis present

## 2019-12-26 DIAGNOSIS — N35919 Unspecified urethral stricture, male, unspecified site: Secondary | ICD-10-CM | POA: Diagnosis present

## 2019-12-26 DIAGNOSIS — Z5329 Procedure and treatment not carried out because of patient's decision for other reasons: Secondary | ICD-10-CM | POA: Diagnosis not present

## 2019-12-26 DIAGNOSIS — N151 Renal and perinephric abscess: Principal | ICD-10-CM | POA: Diagnosis present

## 2019-12-26 DIAGNOSIS — E872 Acidosis: Secondary | ICD-10-CM | POA: Diagnosis present

## 2019-12-26 DIAGNOSIS — N200 Calculus of kidney: Secondary | ICD-10-CM | POA: Diagnosis present

## 2019-12-26 DIAGNOSIS — R32 Unspecified urinary incontinence: Secondary | ICD-10-CM | POA: Diagnosis present

## 2019-12-26 DIAGNOSIS — G9341 Metabolic encephalopathy: Secondary | ICD-10-CM | POA: Diagnosis not present

## 2019-12-26 DIAGNOSIS — N12 Tubulo-interstitial nephritis, not specified as acute or chronic: Secondary | ICD-10-CM | POA: Diagnosis present

## 2019-12-26 DIAGNOSIS — J9601 Acute respiratory failure with hypoxia: Secondary | ICD-10-CM | POA: Diagnosis not present

## 2019-12-26 LAB — CBC
HCT: 35.3 % — ABNORMAL LOW (ref 39.0–52.0)
Hemoglobin: 10.8 g/dL — ABNORMAL LOW (ref 13.0–17.0)
MCH: 29 pg (ref 26.0–34.0)
MCHC: 30.6 g/dL (ref 30.0–36.0)
MCV: 94.9 fL (ref 80.0–100.0)
Platelets: 248 10*3/uL (ref 150–400)
RBC: 3.72 MIL/uL — ABNORMAL LOW (ref 4.22–5.81)
RDW: 16.5 % — ABNORMAL HIGH (ref 11.5–15.5)
WBC: 13.3 10*3/uL — ABNORMAL HIGH (ref 4.0–10.5)
nRBC: 0 % (ref 0.0–0.2)

## 2019-12-26 LAB — CBG MONITORING, ED: Glucose-Capillary: 159 mg/dL — ABNORMAL HIGH (ref 70–99)

## 2019-12-26 NOTE — ED Triage Notes (Signed)
Pt arrived EMS wit c/o weakness and acting lethargic. Found urinating in the couch and admits to weaker than normal.

## 2019-12-27 ENCOUNTER — Emergency Department (HOSPITAL_COMMUNITY): Payer: Medicaid Other

## 2019-12-27 ENCOUNTER — Inpatient Hospital Stay (HOSPITAL_COMMUNITY)
Admission: EM | Admit: 2019-12-27 | Discharge: 2020-01-14 | DRG: 689 | Disposition: A | Discharge status: Home Health | Payer: Medicaid Other | Attending: Internal Medicine | Admitting: Internal Medicine

## 2019-12-27 DIAGNOSIS — N390 Urinary tract infection, site not specified: Secondary | ICD-10-CM

## 2019-12-27 DIAGNOSIS — N281 Cyst of kidney, acquired: Secondary | ICD-10-CM | POA: Diagnosis present

## 2019-12-27 DIAGNOSIS — I4819 Other persistent atrial fibrillation: Secondary | ICD-10-CM | POA: Diagnosis present

## 2019-12-27 DIAGNOSIS — N179 Acute kidney failure, unspecified: Secondary | ICD-10-CM | POA: Diagnosis present

## 2019-12-27 DIAGNOSIS — R531 Weakness: Secondary | ICD-10-CM

## 2019-12-27 DIAGNOSIS — E1122 Type 2 diabetes mellitus with diabetic chronic kidney disease: Secondary | ICD-10-CM | POA: Diagnosis present

## 2019-12-27 DIAGNOSIS — J90 Pleural effusion, not elsewhere classified: Secondary | ICD-10-CM | POA: Diagnosis present

## 2019-12-27 DIAGNOSIS — G9340 Encephalopathy, unspecified: Secondary | ICD-10-CM | POA: Diagnosis not present

## 2019-12-27 DIAGNOSIS — E1142 Type 2 diabetes mellitus with diabetic polyneuropathy: Secondary | ICD-10-CM | POA: Diagnosis present

## 2019-12-27 DIAGNOSIS — Z4659 Encounter for fitting and adjustment of other gastrointestinal appliance and device: Secondary | ICD-10-CM

## 2019-12-27 DIAGNOSIS — J9601 Acute respiratory failure with hypoxia: Secondary | ICD-10-CM | POA: Diagnosis not present

## 2019-12-27 DIAGNOSIS — A419 Sepsis, unspecified organism: Secondary | ICD-10-CM

## 2019-12-27 DIAGNOSIS — E119 Type 2 diabetes mellitus without complications: Secondary | ICD-10-CM | POA: Diagnosis not present

## 2019-12-27 DIAGNOSIS — R509 Fever, unspecified: Secondary | ICD-10-CM | POA: Diagnosis not present

## 2019-12-27 DIAGNOSIS — J9383 Other pneumothorax: Secondary | ICD-10-CM | POA: Diagnosis not present

## 2019-12-27 DIAGNOSIS — G8929 Other chronic pain: Secondary | ICD-10-CM | POA: Diagnosis not present

## 2019-12-27 DIAGNOSIS — R4182 Altered mental status, unspecified: Secondary | ICD-10-CM | POA: Diagnosis not present

## 2019-12-27 DIAGNOSIS — Z09 Encounter for follow-up examination after completed treatment for conditions other than malignant neoplasm: Secondary | ICD-10-CM

## 2019-12-27 DIAGNOSIS — I2510 Atherosclerotic heart disease of native coronary artery without angina pectoris: Secondary | ICD-10-CM | POA: Diagnosis present

## 2019-12-27 DIAGNOSIS — I4891 Unspecified atrial fibrillation: Secondary | ICD-10-CM | POA: Diagnosis present

## 2019-12-27 DIAGNOSIS — I248 Other forms of acute ischemic heart disease: Secondary | ICD-10-CM | POA: Diagnosis present

## 2019-12-27 DIAGNOSIS — R7989 Other specified abnormal findings of blood chemistry: Secondary | ICD-10-CM

## 2019-12-27 DIAGNOSIS — R0602 Shortness of breath: Secondary | ICD-10-CM

## 2019-12-27 DIAGNOSIS — L4050 Arthropathic psoriasis, unspecified: Secondary | ICD-10-CM | POA: Diagnosis present

## 2019-12-27 DIAGNOSIS — I130 Hypertensive heart and chronic kidney disease with heart failure and stage 1 through stage 4 chronic kidney disease, or unspecified chronic kidney disease: Secondary | ICD-10-CM | POA: Diagnosis present

## 2019-12-27 DIAGNOSIS — R0603 Acute respiratory distress: Secondary | ICD-10-CM

## 2019-12-27 DIAGNOSIS — R6520 Severe sepsis without septic shock: Secondary | ICD-10-CM

## 2019-12-27 DIAGNOSIS — G9341 Metabolic encephalopathy: Secondary | ICD-10-CM | POA: Diagnosis not present

## 2019-12-27 DIAGNOSIS — N183 Chronic kidney disease, stage 3 unspecified: Secondary | ICD-10-CM | POA: Diagnosis not present

## 2019-12-27 DIAGNOSIS — I5042 Chronic combined systolic (congestive) and diastolic (congestive) heart failure: Secondary | ICD-10-CM | POA: Diagnosis present

## 2019-12-27 DIAGNOSIS — E785 Hyperlipidemia, unspecified: Secondary | ICD-10-CM | POA: Diagnosis present

## 2019-12-27 DIAGNOSIS — N151 Renal and perinephric abscess: Secondary | ICD-10-CM | POA: Diagnosis present

## 2019-12-27 DIAGNOSIS — N35919 Unspecified urethral stricture, male, unspecified site: Secondary | ICD-10-CM | POA: Diagnosis present

## 2019-12-27 DIAGNOSIS — N1832 Chronic kidney disease, stage 3b: Secondary | ICD-10-CM | POA: Diagnosis present

## 2019-12-27 DIAGNOSIS — J310 Chronic rhinitis: Secondary | ICD-10-CM | POA: Diagnosis present

## 2019-12-27 DIAGNOSIS — J869 Pyothorax without fistula: Secondary | ICD-10-CM

## 2019-12-27 DIAGNOSIS — N189 Chronic kidney disease, unspecified: Secondary | ICD-10-CM | POA: Diagnosis present

## 2019-12-27 DIAGNOSIS — E873 Alkalosis: Secondary | ICD-10-CM | POA: Diagnosis not present

## 2019-12-27 DIAGNOSIS — Q5520 Unspecified congenital malformations of testis and scrotum: Secondary | ICD-10-CM

## 2019-12-27 DIAGNOSIS — I10 Essential (primary) hypertension: Secondary | ICD-10-CM | POA: Diagnosis not present

## 2019-12-27 DIAGNOSIS — E7800 Pure hypercholesterolemia, unspecified: Secondary | ICD-10-CM | POA: Diagnosis not present

## 2019-12-27 DIAGNOSIS — R443 Hallucinations, unspecified: Secondary | ICD-10-CM | POA: Diagnosis not present

## 2019-12-27 DIAGNOSIS — Q559 Congenital malformation of male genital organ, unspecified: Secondary | ICD-10-CM

## 2019-12-27 DIAGNOSIS — D72829 Elevated white blood cell count, unspecified: Secondary | ICD-10-CM | POA: Diagnosis not present

## 2019-12-27 DIAGNOSIS — J9811 Atelectasis: Secondary | ICD-10-CM | POA: Diagnosis present

## 2019-12-27 DIAGNOSIS — I480 Paroxysmal atrial fibrillation: Secondary | ICD-10-CM | POA: Diagnosis not present

## 2019-12-27 DIAGNOSIS — N1831 Chronic kidney disease, stage 3a: Secondary | ICD-10-CM | POA: Diagnosis not present

## 2019-12-27 DIAGNOSIS — E118 Type 2 diabetes mellitus with unspecified complications: Secondary | ICD-10-CM | POA: Diagnosis present

## 2019-12-27 DIAGNOSIS — E872 Acidosis: Secondary | ICD-10-CM | POA: Diagnosis present

## 2019-12-27 DIAGNOSIS — N200 Calculus of kidney: Secondary | ICD-10-CM

## 2019-12-27 DIAGNOSIS — I43 Cardiomyopathy in diseases classified elsewhere: Secondary | ICD-10-CM | POA: Diagnosis present

## 2019-12-27 DIAGNOSIS — Z20822 Contact with and (suspected) exposure to covid-19: Secondary | ICD-10-CM | POA: Diagnosis present

## 2019-12-27 DIAGNOSIS — Z1624 Resistance to multiple antibiotics: Secondary | ICD-10-CM | POA: Diagnosis present

## 2019-12-27 DIAGNOSIS — Z9889 Other specified postprocedural states: Secondary | ICD-10-CM

## 2019-12-27 DIAGNOSIS — Z978 Presence of other specified devices: Secondary | ICD-10-CM

## 2019-12-27 DIAGNOSIS — E854 Organ-limited amyloidosis: Secondary | ICD-10-CM | POA: Diagnosis present

## 2019-12-27 DIAGNOSIS — G4733 Obstructive sleep apnea (adult) (pediatric): Secondary | ICD-10-CM | POA: Diagnosis present

## 2019-12-27 LAB — URINALYSIS, ROUTINE W REFLEX MICROSCOPIC
Bilirubin Urine: NEGATIVE
Glucose, UA: NEGATIVE mg/dL
Hgb urine dipstick: NEGATIVE
Ketones, ur: NEGATIVE mg/dL
Nitrite: NEGATIVE
Protein, ur: 30 mg/dL — AB
Specific Gravity, Urine: 1.018 (ref 1.005–1.030)
pH: 6 (ref 5.0–8.0)

## 2019-12-27 LAB — BASIC METABOLIC PANEL
Anion gap: 13 (ref 5–15)
BUN: 21 mg/dL (ref 8–23)
CO2: 29 mmol/L (ref 22–32)
Calcium: 8.2 mg/dL — ABNORMAL LOW (ref 8.9–10.3)
Chloride: 98 mmol/L (ref 98–111)
Creatinine, Ser: 1.83 mg/dL — ABNORMAL HIGH (ref 0.61–1.24)
GFR calc Af Amer: 44 mL/min — ABNORMAL LOW (ref >60)
GFR calc non Af Amer: 38 mL/min — ABNORMAL LOW (ref >60)
Glucose, Bld: 182 mg/dL — ABNORMAL HIGH (ref 70–99)
Potassium: 4.3 mmol/L (ref 3.5–5.1)
Sodium: 140 mmol/L (ref 135–145)

## 2019-12-27 LAB — CBG MONITORING, ED: Glucose-Capillary: 117 mg/dL — ABNORMAL HIGH (ref 70–99)

## 2019-12-27 LAB — SARS CORONAVIRUS 2 BY RT PCR (HOSPITAL ORDER, PERFORMED IN ~~LOC~~ HOSPITAL LAB): SARS Coronavirus 2: NEGATIVE

## 2019-12-27 LAB — LACTIC ACID, PLASMA
Lactic Acid, Venous: 2.5 mmol/L (ref 0.5–1.9)
Lactic Acid, Venous: 1.1 mmol/L (ref 0.5–1.9)

## 2019-12-27 MED ORDER — DULOXETINE HCL 20 MG PO CPEP
20.0000 mg | ORAL_CAPSULE | Freq: Every day | ORAL | Status: DC
  Filled 2019-12-27: qty 1

## 2019-12-27 MED ORDER — ACETAMINOPHEN 325 MG PO TABS
650.0000 mg | ORAL_TABLET | Freq: Four times a day (QID) | ORAL | Status: DC | PRN
  Administered 2019-12-27 – 2020-01-06 (×13): 650 mg via ORAL
  Filled 2019-12-27 (×14): qty 2

## 2019-12-27 MED ORDER — HEPARIN SODIUM (PORCINE) 5000 UNIT/ML IJ SOLN
5000.0000 [IU] | Freq: Three times a day (TID) | INTRAMUSCULAR | Status: DC
  Administered 2019-12-27 – 2020-01-01 (×15): 5000 [IU] via SUBCUTANEOUS
  Filled 2019-12-27 (×14): qty 1

## 2019-12-27 MED ORDER — ALLOPURINOL 100 MG PO TABS
200.0000 mg | ORAL_TABLET | Freq: Every evening | ORAL | Status: DC
  Administered 2019-12-27 – 2019-12-29 (×3): 200 mg via ORAL
  Filled 2019-12-27 (×5): qty 2

## 2019-12-27 MED ORDER — ASPIRIN EC 81 MG PO TBEC
81.0000 mg | DELAYED_RELEASE_TABLET | Freq: Every day | ORAL | Status: DC
  Administered 2019-12-27 – 2019-12-29 (×3): 81 mg via ORAL
  Filled 2019-12-27 (×4): qty 1

## 2019-12-27 MED ORDER — FAMOTIDINE 20 MG PO TABS
20.0000 mg | ORAL_TABLET | Freq: Two times a day (BID) | ORAL | Status: DC
  Administered 2019-12-27 – 2019-12-30 (×7): 20 mg via ORAL
  Filled 2019-12-27 (×7): qty 1

## 2019-12-27 MED ORDER — PREDNISONE 5 MG PO TABS
5.0000 mg | ORAL_TABLET | Freq: Every day | ORAL | Status: DC
  Administered 2019-12-27 – 2019-12-30 (×4): 5 mg via ORAL
  Filled 2019-12-27 (×4): qty 1

## 2019-12-27 MED ORDER — LABETALOL HCL 5 MG/ML IV SOLN
10.0000 mg | Freq: Once | INTRAVENOUS | Status: AC
  Administered 2019-12-27: 10 mg via INTRAVENOUS
  Filled 2019-12-27: qty 4

## 2019-12-27 MED ORDER — POLYETHYLENE GLYCOL 3350 17 G PO PACK: 17.0000 g | PACK | Freq: Every day | ORAL | Status: DC | PRN

## 2019-12-27 MED ORDER — POTASSIUM CHLORIDE CRYS ER 20 MEQ PO TBCR
20.0000 meq | EXTENDED_RELEASE_TABLET | Freq: Every day | ORAL | Status: DC
  Administered 2019-12-27 – 2020-01-11 (×13): 20 meq via ORAL
  Filled 2019-12-27 (×15): qty 1

## 2019-12-27 MED ORDER — ATORVASTATIN CALCIUM 40 MG PO TABS
40.0000 mg | ORAL_TABLET | Freq: Every day | ORAL | Status: DC
  Administered 2019-12-27 – 2020-01-02 (×5): 40 mg via ORAL
  Filled 2019-12-27 (×5): qty 1

## 2019-12-27 MED ORDER — ALBUTEROL SULFATE (2.5 MG/3ML) 0.083% IN NEBU
2.5000 mg | INHALATION_SOLUTION | Freq: Four times a day (QID) | RESPIRATORY_TRACT | Status: DC | PRN
  Administered 2019-12-31: 2.5 mg via RESPIRATORY_TRACT
  Filled 2019-12-27: qty 3

## 2019-12-27 MED ORDER — ACETAMINOPHEN 325 MG PO TABS
650.0000 mg | ORAL_TABLET | Freq: Once | ORAL | Status: AC
  Administered 2019-12-27: 650 mg via ORAL
  Filled 2019-12-27: qty 2

## 2019-12-27 MED ORDER — SODIUM CHLORIDE 0.9 % IV BOLUS
1000.0000 mL | Freq: Once | INTRAVENOUS | Status: AC
  Administered 2019-12-27: 1000 mL via INTRAVENOUS

## 2019-12-27 MED ORDER — SODIUM CHLORIDE 0.9 % IV SOLN
1.0000 g | INTRAVENOUS | Status: DC
  Administered 2019-12-27 – 2019-12-29 (×3): 1 g via INTRAVENOUS
  Filled 2019-12-27 (×2): qty 1
  Filled 2019-12-27: qty 10

## 2019-12-27 MED ORDER — SODIUM CHLORIDE 0.9 % IV SOLN: INTRAVENOUS | Status: AC

## 2019-12-27 MED ORDER — OXYCODONE HCL 5 MG PO TABS
5.0000 mg | ORAL_TABLET | Freq: Four times a day (QID) | ORAL | Status: DC | PRN
  Administered 2019-12-27 – 2019-12-30 (×10): 5 mg via ORAL
  Filled 2019-12-27 (×10): qty 1

## 2019-12-27 MED ORDER — FLUTICASONE PROPIONATE 50 MCG/ACT NA SUSP
2.0000 | Freq: Every day | NASAL | Status: DC
  Administered 2019-12-30 – 2020-01-03 (×3): 2 via NASAL
  Filled 2019-12-27 (×3): qty 16

## 2019-12-27 NOTE — ED Notes (Signed)
Condom Cath placed on Pt

## 2019-12-27 NOTE — ED Notes (Signed)
US at bedside

## 2019-12-27 NOTE — ED Notes (Signed)
Date and time results received: 12/27/19 0839   Test: Lactic acid  Critical Value: 2.5  Name of Provider Notified: Adhikari   Orders Received? Or Actions Taken?: N/a at this time

## 2019-12-27 NOTE — Progress Notes (Signed)
Spoke with pt regarding cpap.  Pt stated he wears it sometimes at home and does not want to wear it tonight.  Pt was advised that RT is available all night and was encouraged to call should he change his mind.  RN aware.

## 2019-12-27 NOTE — ED Notes (Signed)
Patient transported to CT 

## 2019-12-27 NOTE — ED Provider Notes (Signed)
[facility] DEPT Provider Note   CSN: [id number] Arrival date & time: 12/26/19  2311     History Chief Complaint  Patient presents with  . Weakness    Marco [name] is a 64 y.o. male.  The history is provided by the patient and the spouse.  Weakness Severity:  Moderate Onset quality:  Gradual Timing:  Constant Progression:  Worsening Chronicity:  New Relieved by:  Nothing Worsened by:  Nothing Associated symptoms: arthralgias and shortness of breath   Associated symptoms: no chest pain, no fever and no vomiting   Patient with multiple medical conditions including obesity, CAD, atrial fibrillation, hypertension, diabetes, chronic kidney disease Patient was recently admitted for pyelonephritis.  He reports several days after discharge from the hospital he began noticing increasing weakness.  He reports urinary frequency and difficulty controlling his urine.  No fecal incontinence.  No new back pain.  No new abdominal pain.  He also reports diffuse arthralgias that is likely from psoriatic arthritis No recent trauma reported.    Past Medical History:  Diagnosis Date  . Benign colon polyp 08/01/2013   [facility] in [place]. large base tranverse colon polyp was biopsied. polyp was benign with minimal surface hyperplastic change.  . Chronic combined systolic and diastolic CHF (congestive heart failure) ([place])   . Chronic pain   . CKD (chronic kidney disease), stage III   . Diabetes mellitus without complication ([place])   . Diverticulosis   . Esophageal hiatal hernia 07/29/2013   confirmed on EGD   . Esophageal stricture   . Essential hypertension   . Gastritis 07/29/2013   confirmed on EGD, bx done an negative for intestinal metaplasia, dsyplasia or H. pylori. normal gastric emptying study done 07/13/2013.  [name] GERD (gastroesophageal reflux disease)   . Morbid obesity ([place])   . Non-obstructive CAD    a. 02/2013 Cath ([place]): nonobs dzs;   b. 09/2014 Myoview ([place]): EF 55%, no ischemia;  c. Cath 05/2018 mild CAD no obstruction.  . Persistent atrial fibrillation ([place])    a. 02/2013 s/p rfca in [place], [place]-->prev on Xarelto, d/c'd 2/2 anemia, ? GIB.  [name] Rheumatoid arthritis ([place])   . Sigmoid diverticulosis 08/01/2013   confirmed on colonscopy. record scanned into chart    Patient Active Problem List   Diagnosis Date Noted  . Severe sepsis ([place]) 12/10/2019  . Acute metabolic encephalopathy 0000000  . Hyperkalemia 12/09/2019  . Sepsis due to Gram negative bacteria ([facility]) 12/09/2019  . Rhinitis 10/16/2019  . Gastroesophageal reflux disease without esophagitis 08/31/2018  . Muscle spasm 08/31/2018  . NICM (nonischemic cardiomyopathy) ([place]) 07/06/2018  . Hypokalemia   . Acute renal failure superimposed on stage 3 chronic kidney disease ([place]) 08/27/2017  . Hyperlipidemia 12/23/2016  . Chronic combined systolic and diastolic CHF (congestive heart failure) ([place]) 09/09/2016  . Diabetic polyneuropathy associated with type 2 diabetes mellitus ([place]) 06/10/2016  . Vision changes 06/02/2016  . Myalgia and myositis 03/31/2016  . Shortness of breath 02/09/2016  . Chronic gouty arthritis 11/29/2015  . LBP (low back pain) 11/27/2015  . Arthralgia of multiple joints 11/27/2015  . 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[place]) 11/27/2015  . Breast pain in male 11/14/2015  . Sinusitis, chronic 10/16/2015  . Insomnia 10/16/2015  . New onset of headaches after age 48 09/20/2015  . OSA (obstructive sleep apnea) 07/12/2015  . Low serum testosterone level 05/18/2015  . Depression 05/16/2015  . Morbid obesity due to excess calories ([place])   . Chronic pain   .  Type 2 diabetes mellitus with stage 3 chronic kidney disease, without long-term current use of insulin ([place])   . CAD (coronary artery disease) 12/24/2014  . Atrial fibrillation ([place]) 12/24/2014  . Essential hypertension 12/24/2014  . Chronic kidney disease 12/24/2014  . PUD (peptic ulcer disease)  12/24/2014    Past Surgical History:  Procedure Laterality Date  . CARDIAC CATHETERIZATION  05/2014   ablation for atrial fibrillation  . CARDIAC CATHETERIZATION N/A 11/16/2015   Procedure: Left Heart Cath and Coronary Angiography;  Surgeon: Leonie Man, MD;  Location: [facility];  Service: Cardiovascular;  Laterality: N/A;  . COLON RESECTION  09/2013   due to large, abnormal polpy. non cancerous per patient.   . COLON SURGERY  09/2014   colon resection   . LEFT HEART CATH AND CORONARY ANGIOGRAPHY N/A 05/19/2018   Procedure: LEFT HEART CATH AND CORONARY ANGIOGRAPHY;  Surgeon: Wellington Hampshire, MD;  Location: [facility];  Service: Cardiovascular;  Laterality: N/A;       Family History  Problem Relation Age of Onset  . Hypertension Mother   . Diabetes Mother   . COPD Mother   . Lung cancer Mother        Smoker   . Hypertension Father   . Diabetes Father   . Heart Problems Father   . COPD Father   . Colon cancer Neg Hx   . Stomach cancer Neg Hx     Social History   Tobacco Use  . Smoking status: Former Smoker    Packs/day: 0.50    Years: 10.00    Pack years: 5.00    Types: Cigarettes    Quit date: 04/11/2008    Years since quitting: 11.7  . Smokeless tobacco: Never Used  Substance Use Topics  . Alcohol use: No    Alcohol/week: 0.0 standard drinks  . Drug use: No    Home Medications Prior to Admission medications   Medication Sig Start Date End Date Taking? Authorizing Provider  albuterol (PROVENTIL HFA;VENTOLIN HFA) 108 (90 Base) MCG/ACT inhaler Inhale 2 puffs into the lungs every 6 (six) hours as needed for wheezing or shortness of breath. 04/29/18  Yes [name], MD  atorvastatin (LIPITOR) 40 MG tablet Take 1 tablet (40 mg total) by mouth daily. 09/27/19  Yes Sueanne Margarita, MD  carvedilol (COREG) 12.5 MG tablet Take 1 tablet (12.5 mg total) by mouth 2 (two) times daily with a meal. 09/12/19  Yes Larey Dresser, MD  Accu-Chek FastClix  Lancets MISC Use one strip to monitor glucose levels BID; E11.42 12/06/18   Renato Shin, MD  allopurinol (ZYLOPRIM) 100 MG tablet Take 200 mg by mouth every evening.     [provider]  diclofenac Sodium (VOLTAREN) 1 % GEL Apply 4 g topically 4 (four) times daily. 10/23/19   Darr, Marguerita Beards, [name]  DULoxetine (CYMBALTA) 20 MG capsule TAKE 1 CAPSULE BY MOUTH ONCE DAILY 12/21/19   [name], MD  EQ ASPIRIN ADULT LOW DOSE 81 MG EC tablet TAKE 1 TABLET BY MOUTH ONCE DAILY. SWALLOW WHOLE Patient taking differently: Take 81 mg by mouth at bedtime.  08/01/19   [name], MD  famotidine (PEPCID) 20 MG tablet TAKE 1 TABLET BY MOUTH TWO TIMES DAILY 12/21/19   [name], MD  fluticasone [facility]) 50 MCG/ACT nasal spray Place 2 sprays into both nostrils daily. 09/16/19   [name], MD  furosemide (LASIX) 40 MG tablet Take 1  tablet (40 mg total) by mouth daily. Can take extra as needed 12/16/19 12/15/20  Bensimhon, Shaune Pascal, MD  glucose blood (ACCU-CHEK GUIDE) test strip Use one strip to monitor glucose levels BID; E11.42 12/06/18   Renato Shin, MD  Insulin Lispro Prot & Lispro (HUMALOG MIX 75/25 KWIKPEN) (75-25) 100 UNIT/ML Kwikpen Inject 100 Units into the skin daily with breakfast. 12/20/19   Renato Shin, MD  Insulin Pen Needle (PEN NEEDLES) 31G X 6 MM MISC 1 each by Does not apply route daily. E11.9 10/19/19   Renato Shin, MD  oxyCODONE (OXY IR/ROXICODONE) 5 MG immediate release tablet Take 5 mg by mouth every 6 (six) hours. 11/09/19   [provider]  potassium chloride SA (KLOR-CON) 20 MEQ tablet Take 20 mEq by mouth daily. 12/05/19   [provider]  predniSONE (DELTASONE) 5 MG tablet Take 5 mg by mouth daily. 11/23/19   [provider]  pregabalin (LYRICA) 100 MG capsule Take 100 mg by mouth 3 (three) times daily.     [provider]  sacubitril-valsartan (ENTRESTO) 49-51 MG Take 1 tablet by mouth 2 (two) times daily. 12/16/19    Bensimhon, Shaune Pascal, MD  Secukinumab (COSENTYX) 150 MG/ML SOSY Inject 150 mg into the skin every 28 (twenty-eight) days.  06/02/16   [provider]  Tafamidis (VYNDAMAX) 61 MG CAPS Take 61 mg by mouth daily. 09/08/19   Bensimhon, Shaune Pascal, MD  zolpidem (AMBIEN CR) 6.25 MG CR tablet TAKE 1 TABLET BY MOUTH EVERY DAY AT BEDTIME AS NEEDED FOR SLEEP Patient taking differently: Take 6.25 mg by mouth at bedtime.  12/02/19   [name], MD    Allergies    Nsaids  Review of Systems   Review of Systems  Constitutional: Positive for fatigue. Negative for fever.  Respiratory: Positive for shortness of breath.   Cardiovascular: Negative for chest pain.  Gastrointestinal: Negative for vomiting.  Musculoskeletal: Positive for arthralgias.  Neurological: Positive for weakness.  All other systems reviewed and are negative.   Physical Exam Updated Vital Signs BP 111/76   Pulse 81   Temp 99.2 F (37.3 C) (Oral)   Resp 18   Ht 1.676 m (5\' 6" )   Wt 110.2 kg   SpO2 90%   BMI 39.22 kg/m   Physical Exam CONSTITUTIONAL: Chronically ill-appearing, no acute distress HEAD: Normocephalic/atraumatic EYES: EOMI/PERRL ENMT: Mucous membranes moist NECK: supple no meningeal signs SPINE/BACK:entire spine nontender CV: S1/S2 noted, no murmurs/rubs/gallops noted LUNGS: Lungs are clear to auscultation bilaterally, no apparent distress ABDOMEN: soft, nontender, no rebound or guarding, bowel sounds noted throughout abdomen GU:no cva tenderness NEURO: Pt is awake/alert/appropriate, moves all extremitiesx4.  No facial droop.  EXTREMITIES: pulses normal/equal, full ROM, mild tenderness to right knee but no erythema or edema noted.  Distal pulses equal/intact.  Tenderness noted with range of motion of right knee.  Both hips are nontender to palpation and with range of motion. SKIN: warm, color normal PSYCH: no abnormalities of mood noted, alert and oriented to situation  ED Results / Procedures  / Treatments   Labs (all labs ordered are listed, but only abnormal results are displayed) Labs Reviewed  BASIC METABOLIC PANEL - Abnormal; Notable for the following components:      Result Value   Glucose, Bld 182 (*)    Creatinine, Ser 1.83 (*)    Calcium 8.2 (*)    GFR calc non Af Amer 38 (*)    GFR calc Af Amer 44 (*)  All other components within normal limits  CBC - Abnormal; Notable for the following components:   WBC 13.3 (*)    RBC 3.72 (*)    Hemoglobin 10.8 (*)    HCT 35.3 (*)    RDW 16.5 (*)    All other components within normal limits  URINALYSIS, ROUTINE W REFLEX MICROSCOPIC - Abnormal; Notable for the following components:   Protein, ur 30 (*)    Leukocytes,Ua TRACE (*)    Bacteria, UA RARE (*)    All other components within normal limits  CBG MONITORING, ED - Abnormal; Notable for the following components:   Glucose-Capillary 159 (*)    All other components within normal limits  SARS CORONAVIRUS 2 BY RT PCR (HOSPITAL ORDER, [facility])  CULTURE, BLOOD (ROUTINE X 2)  CULTURE, BLOOD (ROUTINE X 2)  URINE CULTURE  LACTIC ACID, PLASMA  LACTIC ACID, PLASMA    EKG EKG Interpretation  Date/Time:  Monday Dec 26 2019 23:47:18 EDT Ventricular Rate:  67 PR Interval:    QRS Duration: 94 QT Interval:  403 QTC Calculation: 426 R Axis:   12 Text Interpretation: Sinus rhythm Low voltage, precordial leads No significant change since last tracing Confirmed by Ripley Fraise [phone]) on 12/27/2019 4:07:08 AM   Radiology CT Head Wo Contrast  Result Date: 12/27/2019 CLINICAL DATA:  Altered mental status. EXAM: CT HEAD WITHOUT CONTRAST TECHNIQUE: Contiguous axial images were obtained from the base of the skull through the vertex without intravenous contrast. COMPARISON:  Head CT 12/09/2019 FINDINGS: Brain: The ventricles are normal in size and configuration. No extra-axial fluid collections are identified. The gray-white differentiation is  maintained. No CT findings for acute hemispheric infarction or intracranial hemorrhage. No mass lesions. The brainstem and cerebellum are normal. Vascular: Vascular calcifications, advanced for age but no aneurysm or hyperdense vessels. Skull: No acute skull fracture. No bone lesion. Sinuses/Orbits: Stable severe chronic left maxillary and left half sphenoid sinus disease with partially calcified inspissated debris also extending into the left nasal cavity. Sinus wall thickening noted. The other paranasal sinuses and mastoid air cells are clear. The globes are intact. Other: No scalp lesions, laceration or hematoma. IMPRESSION: 1. No acute intracranial findings or mass lesions. 2. Stable severe chronic left maxillary and left half sphenoid sinus disease. Electronically Signed   By: Marijo Sanes M.D.   On: 12/27/2019 07:05   DG Chest Port 1 View  Result Date: 12/27/2019 CLINICAL DATA:  Weakness and lethargy EXAM: PORTABLE CHEST 1 VIEW COMPARISON:  12/15/2019 FINDINGS: Stable borderline heart size and mildly tortuous aortic contours. Mild interstitial crowding at the bases. There is no edema, consolidation, effusion, or pneumothorax. IMPRESSION: No acute finding. Electronically Signed   By: Monte Fantasia M.D.   On: 12/27/2019 05:31   DG Knee Right Port  Result Date: 12/27/2019 CLINICAL DATA:  [place] 1 week ago. Persistent knee pain. EXAM: PORTABLE RIGHT KNEE - 1-2 VIEW COMPARISON:  None. FINDINGS: Mild tricompartmental degenerative changes but no acute bony findings or osteochondral lesion. No joint effusion. Age advanced vascular calcifications are noted. IMPRESSION: Mild degenerative changes but no acute bony findings or joint effusion. Age advanced vascular calcifications. Electronically Signed   By: Marijo Sanes M.D.   On: 12/27/2019 07:06    Procedures Procedures (including critical care time)  Medications Ordered in ED Medications  cefTRIAXone (ROCEPHIN) 1 g in sodium chloride 0.9 % 100 mL IVPB  (1 g Intravenous New Bag/Given 12/27/19 0753)  acetaminophen (TYLENOL) tablet 650 mg (  650 mg Oral Given 12/27/19 [id number])    ED Course  I have reviewed the triage vital signs and the nursing notes.  Pertinent labs & imaging results that were available during my care of the patient were reviewed by me and considered in my medical decision making (see chart for details).    MDM Rules/Calculators/A&P                       This patient presents to the ED for concern of generalized weakness, this involves an extensive number of treatment options, and is a complaint that carries with it a high risk of complications and morbidity.  The differential diagnosis includes UTI, pneumonia, electrolyte abnormality   Lab Tests:   I Ordered, reviewed, and interpreted labs, which included urinalysis, electrolytes, complete blood count  Medicines ordered:   I ordered medication Rocephin for presumed urinary tract infection  Imaging Studies ordered:   I ordered imaging studies which included chest x-ray and CT head  I independently visualized and interpreted imaging which showed no acute findings  Additional history obtained:   Additional history obtained from wife  Previous records obtained and reviewed   Consultations Obtained:   I consulted prior hospitalist and discussed lab and imaging findings  Reevaluation:  After the interventions stated above, I reevaluated the patient and found patient stable  Critical Interventions:  . 8:33 AM . Patient with recent admission for pyelonephritis.  After discharge, patient began to worsen with frequent falls and weakness.  He reports dysuria and urinary frequency.  Wife reports that seem like his urinary tract infection was returning.  On initial exam he was afebrile and hemodynamically appropriate.  No focal weakness, no meningeal signs.  No other focus of infection on exam.  On repeat assessment he became febrile.  Again he continues to be  without meningeal signs, no signs of septic arthritis, no cellulitis.  Continues report dysuria and frequency.  Nursing had difficulty placing a urinary catheter, suspicion for possible UTI versus prostatitis even though urinalysis is essentially negative.  Due to fever and generalized weakness patient will need to be admitted.  Cultures are pending.  Discussed with [facility] hospitalist for admission  Final Clinical Impression(s) / ED Diagnoses Final diagnoses:  Weakness  Acute febrile illness    Rx / DC Orders ED Discharge Orders    None       Ripley Fraise, MD 12/27/19 [phone]

## 2019-12-27 NOTE — Plan of Care (Signed)
  Problem: Clinical Measurements: Goal: Diagnostic test results will improve Outcome: Progressing   Problem: Activity: Goal: Risk for activity intolerance will decrease Outcome: Progressing   Problem: Coping: Goal: Level of anxiety will decrease Outcome: Progressing   

## 2019-12-27 NOTE — ED Notes (Signed)
X-ray at bedside

## 2019-12-27 NOTE — Evaluation (Signed)
Physical Therapy Evaluation Patient Details Name: Marco [name] MRN: [id number] DOB: [birth year] Today's Date: 12/27/2019   History of Present Illness  64 y.o. male with medical history significant of combined congestive heart failure, nonischemic cardiomyopathy, CKD stage III, diabetes type 2, coronary artery disease, hypertension, obesity, OSA A. fib status post ablation in 2017, psoriatic arthritis on daily prednisone therapy who presents to the emergency department complaints of weakness, lethargic, falls at home.  He was recently admitted and was discharged earlier this month after being managed for  pyelonephritis.  Patient was noticing increasing weakness after discharge to home.  He also fell multiple times at home. Pt admitted for possible sepsis and recurrent UTI  Clinical Impression  Pt admitted with above diagnosis.  Pt currently with functional limitations due to the deficits listed below (see PT Problem List). Pt will benefit from skilled PT to increase their independence and safety with mobility to allow discharge to the venue listed below.  Pt assisted to bathroom per request and then to recliner.  Pt reports generalized weakness and a few falls at home after recent admission and discharge.  Pt plans to return home with spouse upon d/c.     Follow Up Recommendations Home health PT;Supervision for mobility/OOB    Equipment Recommendations  Rolling walker with 5" wheels    Recommendations for Other Services       Precautions / Restrictions Precautions Precautions: Fall      Mobility  Bed Mobility Overal bed mobility: Needs Assistance Bed Mobility: Supine to Sit     Supine to sit: Supervision;HOB elevated        Transfers Overall transfer level: Needs assistance Equipment used: Rolling walker (2 wheeled) Transfers: Sit to/from Stand Sit to Stand: Min guard         General transfer comment: incr time, min/guard for safety due to falls at  home  Ambulation/Gait Ambulation/Gait assistance: Min guard Gait Distance (Feet): 16 Feet Assistive device: Rolling walker (2 wheeled) Gait Pattern/deviations: Step-through pattern;Decreased stride length     General Gait Details: cues for use of RW, ambulated to/from bathroom, pt reports generalized weakness  Stairs            Wheelchair Mobility    Modified Rankin (Stroke Patients Only)       Balance Overall balance assessment: History of Falls                                           Pertinent Vitals/Pain Pain Assessment: 0-10 Pain Score: 4  Pain Location: generalized; back Pain Descriptors / Indicators: Discomfort;Sore Pain Intervention(s): Repositioned;Monitored during session    Home Living Family/patient expects to be discharged to:: Private residence Living Arrangements: Spouse/significant other Available Help at Discharge: Family Type of Home: House Home Access: Level entry     Price: [place]: None      Prior Function Level of Independence: Independent         Comments: reports weakness and falls upon recent d/c after hospitalization     Hand Dominance        Extremity/Trunk Assessment        Lower Extremity Assessment Lower Extremity Assessment: Generalized weakness    Cervical / Trunk Assessment Cervical / Trunk Assessment: Normal  Communication   Communication: No difficulties  Cognition Arousal/Alertness: Awake/alert Behavior During Therapy: WFL for tasks assessed/performed Overall Cognitive Status: Within Functional Limits  for tasks assessed                                        General Comments      Exercises     Assessment/Plan    PT Assessment Patient needs continued PT services  PT Problem List Decreased mobility;Decreased activity tolerance;Decreased balance;Decreased knowledge of use of DME;Decreased strength       PT Treatment Interventions DME  instruction;Gait training;Functional mobility training;Therapeutic activities;Patient/family education;Therapeutic exercise    PT Goals (Current goals can be found in the Care Plan section)  Acute Rehab PT Goals PT Goal Formulation: With patient Time For Goal Achievement: 01/10/20 Potential to Achieve Goals: Good    Frequency Min 3X/week   Barriers to discharge        Co-evaluation               AM-PAC PT "6 Clicks" Mobility  Outcome Measure Help needed turning from your back to your side while in a flat bed without using bedrails?: A Little Help needed moving from lying on your back to sitting on the side of a flat bed without using bedrails?: A Little Help needed moving to and from a bed to a chair (including a wheelchair)?: A Little Help needed standing up from a chair using your arms (e.g., wheelchair or bedside chair)?: A Little Help needed to walk in hospital room?: A Little Help needed climbing 3-5 steps with a railing? : A Lot 6 Click Score: 17    End of Session   Activity Tolerance: Patient limited by fatigue Patient left: in chair;with chair alarm set;with call bell/phone within reach Nurse Communication: Mobility status PT Visit Diagnosis: Difficulty in walking, not elsewhere classified (R26.2)    Time: [id number] PT Time Calculation (min) (ACUTE ONLY): 30 min   Charges:   PT Evaluation $PT Eval Low Complexity: 1 Low         Kati PT, DPT Acute Rehabilitation Services Office: [phone]  Trena Platt 12/27/2019, 3:53 PM

## 2019-12-27 NOTE — H&P (Addendum)
History and Physical    Marco [name] [id number] DOB: 05-Dec-1955 DOA: 12/27/2019  PCP: [name], MD   Patient coming from: Home    Chief Complaint: Weakness, lethargy  HPI: Marco [name] is a 64 y.o. male with medical history significant of combined congestive heart failure, nonischemic cardiomyopathy, CKD stage III, diabetes type 2, coronary artery disease, hypertension, obesity, OSA A. fib status post ablation in 2017, psoriatic arthritis on daily prednisone therapy who presents to the emergency department complaints of weakness, lethargic, falls at home.  He was recently admitted and was discharged earlier this month after being managed for  pyelonephritis.  Patient was noticing increasing weakness after discharge to home.  He also fell multiple times at home.  He also reported continuous urinary frequency, dysuria and incontinence.  Also reported diffuse joint pain from his psoriatic arthritis.  There was no report of fever. He lives with his wife.  On his normal days, he is physically active, ambulates without any problem. Covid screen test came out to be negative. Patient seen and examined at the bedside in the emergency department.  He was hemodynamically stable during my evaluation.  He is a poor historian and remains very sleepy but states that he feels feeling better and wants some rest.  He denies any chest pain, shortness of breath, abdominal pain or diarrhea.  ED Course: On presentation he was  hemodynamically stable.  There was no source of focal infection.  Found to have fever of 100.8 on presentation.  He was complaining of dysuria and frequency.  Found to have  leukocytosis.  Elevated lactic acid level was found and he was given IV fluid boluses.  Patient was admitted for the management of recurrent UTI/possible prostatitis from recent UTI. Urine culture, blood culture sent.  Review of Systems: As per HPI otherwise 10 point review of systems negative.    Past  Medical History:  Diagnosis Date  . Benign colon polyp 08/01/2013   [facility] in [place]. large base tranverse colon polyp was biopsied. polyp was benign with minimal surface hyperplastic change.  . Chronic combined systolic and diastolic CHF (congestive heart failure) ([place])   . Chronic pain   . CKD (chronic kidney disease), stage III   . Diabetes mellitus without complication ([place])   . Diverticulosis   . Esophageal hiatal hernia 07/29/2013   confirmed on EGD   . Esophageal stricture   . Essential hypertension   . Gastritis 07/29/2013   confirmed on EGD, bx done an negative for intestinal metaplasia, dsyplasia or H. pylori. normal gastric emptying study done 07/13/2013.  [name] GERD (gastroesophageal reflux disease)   . Morbid obesity ([place])   . Non-obstructive CAD    a. 02/2013 Cath ([place]): nonobs dzs;  b. 09/2014 Myoview ([place]): EF 55%, no ischemia;  c. Cath 05/2018 mild CAD no obstruction.  . Persistent atrial fibrillation ([place])    a. 02/2013 s/p rfca in [place], [place]-->prev on Xarelto, d/c'd 2/2 anemia, ? GIB.  [name] Rheumatoid arthritis ([place])   . Sigmoid diverticulosis 08/01/2013   confirmed on colonscopy. record scanned into chart    Past Surgical History:  Procedure Laterality Date  . CARDIAC CATHETERIZATION  05/2014   ablation for atrial fibrillation  . CARDIAC CATHETERIZATION N/A 11/16/2015   Procedure: Left Heart Cath and Coronary Angiography;  Surgeon: Leonie Man, MD;  Location: [facility];  Service: Cardiovascular;  Laterality: N/A;  . COLON RESECTION  09/2013   due to large, abnormal polpy.  non cancerous per patient.   . COLON SURGERY  09/2014   colon resection   . LEFT HEART CATH AND CORONARY ANGIOGRAPHY N/A 05/19/2018   Procedure: LEFT HEART CATH AND CORONARY ANGIOGRAPHY;  Surgeon: Wellington Hampshire, MD;  Location: [facility];  Service: Cardiovascular;  Laterality: N/A;     reports that he quit smoking about 11 years ago. His smoking use included  cigarettes. He has a 5.00 pack-year smoking history. He has never used smokeless tobacco. He reports that he does not drink alcohol or use drugs.  Allergies  Allergen Reactions  . Nsaids Other (See Comments)    Stomach pains. Ulcers - stated by patient     Family History  Problem Relation Age of Onset  . Hypertension Mother   . Diabetes Mother   . COPD Mother   . Lung cancer Mother        Smoker   . Hypertension Father   . Diabetes Father   . Heart Problems Father   . COPD Father   . Colon cancer Neg Hx   . Stomach cancer Neg Hx      Prior to Admission medications   Medication Sig Start Date End Date Taking? Authorizing Provider  albuterol (PROVENTIL HFA;VENTOLIN HFA) 108 (90 Base) MCG/ACT inhaler Inhale 2 puffs into the lungs every 6 (six) hours as needed for wheezing or shortness of breath. 04/29/18  Yes [name], MD  allopurinol (ZYLOPRIM) 100 MG tablet Take 200 mg by mouth every evening.    Yes [provider]  atorvastatin (LIPITOR) 40 MG tablet Take 1 tablet (40 mg total) by mouth daily. 09/27/19  Yes Sueanne Margarita, MD  carvedilol (COREG) 12.5 MG tablet Take 1 tablet (12.5 mg total) by mouth 2 (two) times daily with a meal. 09/12/19  Yes Larey Dresser, MD  diclofenac Sodium (VOLTAREN) 1 % GEL Apply 4 g topically 4 (four) times daily. 10/23/19  Yes Darr, Marguerita Beards, [name]  DULoxetine (CYMBALTA) 20 MG capsule TAKE 1 CAPSULE BY MOUTH ONCE DAILY Patient taking differently: Take 20 mg by mouth daily.  12/21/19  Yes [name], MD  EQ ASPIRIN ADULT LOW DOSE 81 MG EC tablet TAKE 1 TABLET BY MOUTH ONCE DAILY. SWALLOW WHOLE Patient taking differently: Take 81 mg by mouth at bedtime.  08/01/19  Yes [name], MD  famotidine (PEPCID) 20 MG tablet TAKE 1 TABLET BY MOUTH TWO TIMES DAILY Patient taking differently: Take 20 mg by mouth 2 (two) times daily.  12/21/19  Yes [name], MD  fluticasone (FLONASE) 50 MCG/ACT nasal spray Place 2 sprays into  both nostrils daily. 09/16/19  Yes [name], MD  furosemide (LASIX) 40 MG tablet Take 1 tablet (40 mg total) by mouth daily. Can take extra as needed 12/16/19 12/15/20 Yes Bensimhon, Shaune Pascal, MD  Insulin Lispro Prot & Lispro (HUMALOG MIX 75/25 KWIKPEN) (75-25) 100 UNIT/ML Kwikpen Inject 100 Units into the skin daily with breakfast. 12/20/19  Yes Renato Shin, MD  oxyCODONE (OXY IR/ROXICODONE) 5 MG immediate release tablet Take 5 mg by mouth every 6 (six) hours. 11/09/19  Yes [provider]  potassium chloride SA (KLOR-CON) 20 MEQ tablet Take 20 mEq by mouth daily. 12/05/19  Yes [provider]  predniSONE (DELTASONE) 5 MG tablet Take 5 mg by mouth daily. 11/23/19  Yes [provider]  pregabalin (LYRICA) 100 MG capsule Take 100 mg by mouth 3 (three) times daily.    Yes [provider]  sacubitril-valsartan (ENTRESTO) 49-51 MG Take 1 tablet by mouth 2 (two) times daily. Patient taking differently: Take 0.5 tablets by mouth 2 (two) times daily.  12/16/19  Yes Bensimhon, Shaune Pascal, MD  Secukinumab (COSENTYX) 150 MG/ML SOSY Inject 150 mg into the skin every 28 (twenty-eight) days.  06/02/16  Yes [provider]  Tafamidis (VYNDAMAX) 61 MG CAPS Take 61 mg by mouth daily. 09/08/19  Yes Bensimhon, Shaune Pascal, MD  zolpidem (AMBIEN CR) 6.25 MG CR tablet TAKE 1 TABLET BY MOUTH EVERY DAY AT BEDTIME AS NEEDED FOR SLEEP Patient taking differently: Take 6.25 mg by mouth at bedtime.  12/02/19  Yes [name], MD  Accu-Chek FastClix Lancets MISC Use one strip to monitor glucose levels BID; E11.42 12/06/18   Renato Shin, MD  glucose blood (ACCU-CHEK GUIDE) test strip Use one strip to monitor glucose levels BID; E11.42 12/06/18   Renato Shin, MD  Insulin Pen Needle (PEN NEEDLES) 31G X 6 MM MISC 1 each by Does not apply route daily. E11.9 10/19/19   Renato Shin, MD    Physical Exam: Vitals:   12/27/19 0800 12/27/19 0830 12/27/19 0900 12/27/19 0930  BP: 140/72 (!)  142/69 (!) 142/70 136/68  Pulse: 70 69 69 64  Resp: 13 15 14 13   Temp:      TempSrc:      SpO2: 90% (!) 87% 94% 92%  Weight:      Height:        Constitutional: Morbidly obese, comfortable, sleepy Vitals:   12/27/19 0800 12/27/19 0830 12/27/19 0900 12/27/19 0930  BP: 140/72 (!) 142/69 (!) 142/70 136/68  Pulse: 70 69 69 64  Resp: 13 15 14 13   Temp:      TempSrc:      SpO2: 90% (!) 87% 94% 92%  Weight:      Height:       ENMT: Mucous membranes are moist.  Neck: normal, supple, no masses, no thyromegaly Respiratory: clear to auscultation bilaterally, no wheezing, no crackles. Normal respiratory effort. No accessory muscle use.  Cardiovascular: Regular rate and rhythm, no murmurs / rubs / gallops. No extremity edema.  Abdomen: suprapubic tenderness , no masses palpated. No hepatosplenomegaly. Bowel sounds positive.  Musculoskeletal: no clubbing / cyanosis. No joint deformity upper and lower extremities.  Skin: no rashes, lesions, ulcers. No induration Neurologic: CN 2-12 grossly intact.  Strength 5/5 in all 4.  Psychiatric: Normal judgment and insight. Alert and oriented x 3. Normal mood.   Foley Catheter:None  Labs on Admission: I have personally reviewed following labs and imaging studies  CBC: Recent Labs  Lab 12/26/19 2321  WBC 13.3*  HGB 10.8*  HCT 35.3*  MCV 94.9  PLT [id number]   Basic Metabolic Panel: Recent Labs  Lab 12/26/19 2321  NA 140  K 4.3  CL 98  CO2 29  GLUCOSE 182*  BUN 21  CREATININE 1.83*  CALCIUM 8.2*   GFR: Estimated Creatinine Clearance: 47.5 mL/min (A) (by C-G formula based on SCr of 1.83 mg/dL (H)). Liver Function Tests: No results for input(s): AST, ALT, ALKPHOS, BILITOT, PROT, ALBUMIN in the last 168 hours. No results for input(s): LIPASE, AMYLASE in the last 168 hours. No results for input(s): AMMONIA in the last 168 hours. Coagulation Profile: No results for input(s): INR, PROTIME in the last 168 hours. Cardiac Enzymes: No results  for input(s): CKTOTAL, CKMB, CKMBINDEX, TROPONINI in the last 168 hours. BNP (last 3 results) No results for input(s): PROBNP in the  last 8760 hours. HbA1C: No results for input(s): HGBA1C in the last 72 hours. CBG: Recent Labs  Lab 12/26/19 2350  GLUCAP 159*   Lipid Profile: No results for input(s): CHOL, HDL, LDLCALC, TRIG, CHOLHDL, LDLDIRECT in the last 72 hours. Thyroid Function Tests: No results for input(s): TSH, T4TOTAL, FREET4, T3FREE, THYROIDAB in the last 72 hours. Anemia Panel: No results for input(s): VITAMINB12, FOLATE, FERRITIN, TIBC, IRON, RETICCTPCT in the last 72 hours. Urine analysis:    Component Value Date/Time   COLORURINE YELLOW 12/27/2019 0444   APPEARANCEUR CLEAR 12/27/2019 0444   LABSPEC 1.018 12/27/2019 0444   PHURINE 6.0 12/27/2019 0444   GLUCOSEU NEGATIVE 12/27/2019 0444   HGBUR NEGATIVE 12/27/2019 0444   BILIRUBINUR NEGATIVE 12/27/2019 0444   BILIRUBINUR negative 10/20/2018 1533   BILIRUBINUR negative 01/29/2018 1620   KETONESUR NEGATIVE 12/27/2019 0444   PROTEINUR 30 (A) 12/27/2019 0444   UROBILINOGEN 0.2 10/20/2018 1533   UROBILINOGEN 0.2 04/14/2015 1932   NITRITE NEGATIVE 12/27/2019 0444   LEUKOCYTESUR TRACE (A) 12/27/2019 0444    Radiological Exams on Admission: CT Head Wo Contrast  Result Date: 12/27/2019 CLINICAL DATA:  Altered mental status. EXAM: CT HEAD WITHOUT CONTRAST TECHNIQUE: Contiguous axial images were obtained from the base of the skull through the vertex without intravenous contrast. COMPARISON:  Head CT 12/09/2019 FINDINGS: Brain: The ventricles are normal in size and configuration. No extra-axial fluid collections are identified. The gray-white differentiation is maintained. No CT findings for acute hemispheric infarction or intracranial hemorrhage. No mass lesions. The brainstem and cerebellum are normal. Vascular: Vascular calcifications, advanced for age but no aneurysm or hyperdense vessels. Skull: No acute skull fracture.  No bone lesion. Sinuses/Orbits: Stable severe chronic left maxillary and left half sphenoid sinus disease with partially calcified inspissated debris also extending into the left nasal cavity. Sinus wall thickening noted. The other paranasal sinuses and mastoid air cells are clear. The globes are intact. Other: No scalp lesions, laceration or hematoma. IMPRESSION: 1. No acute intracranial findings or mass lesions. 2. Stable severe chronic left maxillary and left half sphenoid sinus disease. Electronically Signed   By: Marijo Sanes M.D.   On: 12/27/2019 07:05   DG Chest Port 1 View  Result Date: 12/27/2019 CLINICAL DATA:  Weakness and lethargy EXAM: PORTABLE CHEST 1 VIEW COMPARISON:  12/15/2019 FINDINGS: Stable borderline heart size and mildly tortuous aortic contours. Mild interstitial crowding at the bases. There is no edema, consolidation, effusion, or pneumothorax. IMPRESSION: No acute finding. Electronically Signed   By: Monte Fantasia M.D.   On: 12/27/2019 05:31   DG Knee Right Port  Result Date: 12/27/2019 CLINICAL DATA:  [place] 1 week ago. Persistent knee pain. EXAM: PORTABLE RIGHT KNEE - 1-2 VIEW COMPARISON:  None. FINDINGS: Mild tricompartmental degenerative changes but no acute bony findings or osteochondral lesion. No joint effusion. Age advanced vascular calcifications are noted. IMPRESSION: Mild degenerative changes but no acute bony findings or joint effusion. Age advanced vascular calcifications. Electronically Signed   By: Marijo Sanes M.D.   On: 12/27/2019 07:06     Assessment/Plan Principal Problem:   Sepsis ([place]) Active Problems:   Essential hypertension   Chronic kidney disease   Suspected sepsis: Presented with fever, lactic acidosis, lethargy.  Continue IV fluids, follow-up cultures.  Currently hemodynamically stable.  Recurrent UTI/Suspected prostatitis from recent UTI: Presented with complaints of dysuria, frequency.  He was recently admitted here for the management of  sepsis/pyelonephritis and was treated with IV antibiotics and was discharged.  CT abdomen at  that time suggested right-sided pyelonephritis.  Urine culture + for  E. coli at that time. Possibility of prostatitis due to continued symptoms of dysuria.  Elevated lactic acid  level .recheck urine culture, blood culture.  Ultrasound imaging of prostate to rule out prostatitis. UA was not reassuring.he has leucocytosis. Started on ceftriaxone.  AKI on CKD stage IIIb: Baseline creatinine of 1.5.  Elevated creatinine from baseline.  Continue gentle IV fluids.  Check BMP tomorrow.  Generalized weakness: From deconditioning, urinary tract infection.  Continue supportive care, physical therapy assessment.  Diabetes type 2: Takes insulin at home.  Continue current insulin regimen.  Monitor CBGs.  Chronic combined systolic/diastolic congestive heart failure: Last echo done on 10/2019 showed ejection fraction of 55 to 60%.  He is on Coreg, Entresto, Lasix at home which will be held due to AKI.    Hypertension: On Coreg, Imdur, hydralazine, Entresto, Lasix at home.  Blood pressure stable without this medications.  Continue monitoring blood pressure holding these medicines for now.  Neuropathy: Takes Lyrica at home which will be held due to generalized weakness  Psoriatic arthritis: Takes prednisone 5 mg at home which we will continue.Also on monthly Cosyntex.  Normocytic anemia: Most likely associated with CKD.  Hemoglobin stable in the range of 10.  Morbid obesity: BMI of 39.2.  OSA: On cpap at home    Severity of Illness: The appropriate patient status for this patient is INPATIENT.  DVT prophylaxis: Heparin [place] Code Status: Full Family Communication: Called to both spouse and daughter on phone, calls not received. Consults called: None     Shelly Coss MD [facility]  12/27/2019, 9:48 AM

## 2019-12-27 NOTE — ED Notes (Addendum)
CRITICAL VALUE ALERT  Critical Value:  Lactic 2.5  Date & Time Notied: 12/27/2019 0840  Provider Notified: Tawanna Solo, MD  Orders Received/Actions taken: 1L NS bolus ordered, patient already receive antibotics

## 2019-12-28 ENCOUNTER — Inpatient Hospital Stay (HOSPITAL_COMMUNITY): Payer: Medicaid Other

## 2019-12-28 LAB — BASIC METABOLIC PANEL
Anion gap: 10 (ref 5–15)
BUN: 14 mg/dL (ref 8–23)
CO2: 28 mmol/L (ref 22–32)
Calcium: 7.9 mg/dL — ABNORMAL LOW (ref 8.9–10.3)
Chloride: 103 mmol/L (ref 98–111)
Creatinine, Ser: 1.56 mg/dL — ABNORMAL HIGH (ref 0.61–1.24)
GFR calc Af Amer: 54 mL/min — ABNORMAL LOW (ref >60)
GFR calc non Af Amer: 46 mL/min — ABNORMAL LOW (ref >60)
Glucose, Bld: 116 mg/dL — ABNORMAL HIGH (ref 70–99)
Potassium: 4.1 mmol/L (ref 3.5–5.1)
Sodium: 141 mmol/L (ref 135–145)

## 2019-12-28 LAB — CBC
HCT: 34.4 % — ABNORMAL LOW (ref 39.0–52.0)
Hemoglobin: 10.5 g/dL — ABNORMAL LOW (ref 13.0–17.0)
MCH: 29.3 pg (ref 26.0–34.0)
MCHC: 30.5 g/dL (ref 30.0–36.0)
MCV: 96.1 fL (ref 80.0–100.0)
Platelets: 235 10*3/uL (ref 150–400)
RBC: 3.58 MIL/uL — ABNORMAL LOW (ref 4.22–5.81)
RDW: 16.3 % — ABNORMAL HIGH (ref 11.5–15.5)
WBC: 12.2 10*3/uL — ABNORMAL HIGH (ref 4.0–10.5)
nRBC: 0 % (ref 0.0–0.2)

## 2019-12-28 LAB — URINE CULTURE: Culture: NO GROWTH

## 2019-12-28 MED ORDER — HYDRALAZINE HCL 10 MG PO TABS
10.0000 mg | ORAL_TABLET | Freq: Three times a day (TID) | ORAL | Status: AC
  Administered 2019-12-28: 10 mg via ORAL
  Filled 2019-12-28: qty 1

## 2019-12-28 MED ORDER — PHENAZOPYRIDINE HCL 100 MG PO TABS
100.0000 mg | ORAL_TABLET | Freq: Three times a day (TID) | ORAL | Status: DC
  Administered 2019-12-28 – 2019-12-30 (×6): 100 mg via ORAL
  Filled 2019-12-28 (×7): qty 1

## 2019-12-28 MED ORDER — FUROSEMIDE 40 MG PO TABS
40.0000 mg | ORAL_TABLET | Freq: Every day | ORAL | Status: DC
  Administered 2019-12-28 – 2019-12-30 (×3): 40 mg via ORAL
  Filled 2019-12-28 (×3): qty 1

## 2019-12-28 MED ORDER — CARVEDILOL 12.5 MG PO TABS
12.5000 mg | ORAL_TABLET | Freq: Two times a day (BID) | ORAL | Status: DC
  Administered 2019-12-28 – 2020-01-02 (×5): 12.5 mg via ORAL
  Filled 2019-12-28 (×6): qty 1

## 2019-12-28 MED ORDER — FENTANYL CITRATE (PF) 100 MCG/2ML IJ SOLN
25.0000 ug | Freq: Once | INTRAMUSCULAR | Status: AC
  Administered 2019-12-29: 25 ug via INTRAVENOUS
  Filled 2019-12-28: qty 2

## 2019-12-28 NOTE — Progress Notes (Addendum)
PROGRESS NOTE    Marco [name]  [id number] DOB: 09/04/1955 DOA: 12/27/2019 PCP: [name], MD   Brief Narrative:  Marco [name] is a 64 y.o. male with medical history significant of combined congestive heart failure, nonischemic cardiomyopathy, CKD stage III, diabetes type 2, coronary artery disease, hypertension, obesity, OSA A. fib status post ablation in 2017, psoriatic arthritis on daily prednisone therapy who presents to the emergency department complaints of weakness, lethargic, falls at home.  He was recently admitted and was discharged earlier this month after being managed for  pyelonephritis.  Patient was noticing increasing weakness after discharge to home.  He also fell multiple times at home.  He also reported continuous urinary frequency, dysuria and incontinence Patient was admitted for the suspicion of recurrent urinary tract infection/prostatitis.  Urine culture did not show any growth.  Assessment & Plan:   Principal Problem:   Sepsis ([place]) Active Problems:   Essential hypertension   Chronic kidney disease   Suspected sepsis: Presented with fever, lactic acidosis, lethargy.    Currently hemodynamically stable.  IV fluids discontinued today.  He still has mild grade fever.  Recurrent UTI/Suspected prostatitis from recent UTI/persistent dysuria: Presented with complaints of dysuria, frequency.  He was recently admitted here for the management of sepsis/pyelonephritis and was treated with IV antibiotics and was discharged.  CT abdomen at that time suggested right-sided pyelonephritis.  Urine culture + for  E. coli at that time. Possibility of prostatitis due to continued symptoms of dysuria.   Ultrasound imaging of pelvis did not show any prostatitis or cystitis.   UA was not reassuring.He has mild leukocytosis.  Urine culture did not show any growth Started on ceftriaxone, will continue for now because he still has mild grade fever.  Symptoms of dysuria has  improved today but still there and he has suprapubic tenderness.  Started on Pyridium. Will check CT abdomen/pelvis without contrast.  AKI on CKD stage IIIb: Baseline creatinine of 1.5.  Elevated creatinine from baseline on presentation but now close to baseline.IV fluids stopped.  Generalized weakness: From deconditioning, urinary tract infection.  Continue supportive care, physical therapy assessment done and recommended HHPT  Diabetes type 2: Takes insulin at home.  Continue current insulin regimen.  Monitor CBGs.  Chronic combined systolic/diastolic congestive heart failure: Last echo done on 10/2019 showed ejection fraction of 55 to 60%.  He is on Coreg, Entresto, Lasix at home .  Restarted Lasix and Coreg   hypertension: On Coreg, Imdur, hydralazine, Entresto, Lasix at home.    Hypertensive  this morning so home medications restarted.    Neuropathy: Takes Lyrica at home which has been held due to generalized weakness  Psoriatic arthritis: Takes prednisone 5 mg at home which we will continue.Also on monthly Cosyntex.  Normocytic anemia: Most likely associated with CKD.  Hemoglobin stable in the range of 10.  Morbid obesity: BMI of 39.2.  OSA: On cpap at home         DVT prophylaxis: Heparin [place] Code Status: Full Family Communication: Called and discussed with wife on 12/28/2019 Status is: Inpatient  Remains inpatient appropriate because:Inpatient level of care appropriate due to severity of illness   Dispo: The patient is from: Home              Anticipated d/c is to: Home              Anticipated d/c date is: 2 days  Patient currently is not medically stable to d/c.   Consultants: None  Procedures:None  Antimicrobials:  Anti-infectives (From admission, onward)   Start     Dose/Rate Route Frequency Ordered Stop   12/27/19 0730  cefTRIAXone (ROCEPHIN) 1 g in sodium chloride 0.9 % 100 mL IVPB     1 g 200 mL/hr over 30 Minutes Intravenous Every 24  hours 12/27/19 0714        Subjective: Patient seen and examined at the bedside this morning.  Feels better today.  More alert and awake.  His dysuria has improved but he still feels some burning while passing urine.  He has been intermittently febrile with temperature in the range of 100F.  Objective: Vitals:   12/28/19 0011 12/28/19 0212 12/28/19 0548 12/28/19 0716  BP: (!) 144/64 (!) 144/73 (!) 162/79   Pulse: 65 63 75   Resp: 15 20 20    Temp: 99.7 F (37.6 C) 99.3 F (37.4 C) (!) 101.3 F (38.5 C) 100.2 F (37.9 C)  TempSrc: Oral Oral Oral Oral  SpO2:  96% 95%   Weight:      Height:        Intake/Output Summary (Last 24 hours) at 12/28/2019 0835 Last data filed at 12/28/2019 0700 Gross per 24 hour  Intake 3181.34 ml  Output 800 ml  Net 2381.34 ml   Filed Weights   12/26/19 2321 12/27/19 1254  Weight: 110.2 kg 108.3 kg    Examination:  General exam: Not in distress, morbidly obese HEENT:PERRL,Oral mucosa moist, Ear/Nose normal on gross exam Respiratory system: Bilateral basal crackles, no wheezes  cardiovascular system: S1 & S2 heard, RRR. No JVD, murmurs, rubs, gallops or clicks. No pedal edema. Gastrointestinal system: Abdomen is nondistended, soft and has suprapubic tenderness. No organomegaly or masses felt. Normal bowel sounds heard. Central nervous system: Alert and oriented. No focal neurological deficits. Extremities: No edema, no clubbing ,no cyanosis, distal peripheral pulses palpable. Skin: No rashes, lesions or ulcers,no icterus ,no pallor  Data Reviewed: I have personally reviewed following labs and imaging studies  CBC: Recent Labs  Lab 12/26/19 2321 12/28/19 0440  WBC 13.3* 12.2*  HGB 10.8* 10.5*  HCT 35.3* 34.4*  MCV 94.9 96.1  PLT 248 [id number]   Basic Metabolic Panel: Recent Labs  Lab 12/26/19 2321 12/28/19 0440  NA 140 141  K 4.3 4.1  CL 98 103  CO2 29 28  GLUCOSE 182* 116*  BUN 21 14  CREATININE 1.83* 1.56*  CALCIUM 8.2* 7.9*    GFR: Estimated Creatinine Clearance: 55.2 mL/min (A) (by C-G formula based on SCr of 1.56 mg/dL (H)). Liver Function Tests: No results for input(s): AST, ALT, ALKPHOS, BILITOT, PROT, ALBUMIN in the last 168 hours. No results for input(s): LIPASE, AMYLASE in the last 168 hours. No results for input(s): AMMONIA in the last 168 hours. Coagulation Profile: No results for input(s): INR, PROTIME in the last 168 hours. Cardiac Enzymes: No results for input(s): CKTOTAL, CKMB, CKMBINDEX, TROPONINI in the last 168 hours. BNP (last 3 results) No results for input(s): PROBNP in the last 8760 hours. HbA1C: No results for input(s): HGBA1C in the last 72 hours. CBG: Recent Labs  Lab 12/26/19 2350 12/27/19 1013  GLUCAP 159* 117*   Lipid Profile: No results for input(s): CHOL, HDL, LDLCALC, TRIG, CHOLHDL, LDLDIRECT in the last 72 hours. Thyroid Function Tests: No results for input(s): TSH, T4TOTAL, FREET4, T3FREE, THYROIDAB in the last 72 hours. Anemia Panel: No results for input(s): VITAMINB12, FOLATE, FERRITIN, TIBC, IRON,  RETICCTPCT in the last 72 hours. Sepsis Labs: Recent Labs  Lab 12/27/19 0730 12/27/19 1008  LATICACIDVEN 2.5* 1.1    Recent Results (from the past 240 hour(s))  SARS Coronavirus 2 by RT PCR (hospital order, performed in [facility] hospital lab) Nasopharyngeal Nasopharyngeal Swab     Status: None   Collection Time: 12/27/19  4:44 AM   Specimen: Nasopharyngeal Swab  Result Value Ref Range Status   SARS Coronavirus 2 NEGATIVE NEGATIVE Final    Comment: (NOTE) SARS-CoV-2 target nucleic acids are NOT DETECTED. The SARS-CoV-2 RNA is generally detectable in upper and lower respiratory specimens during the acute phase of infection. The lowest concentration of SARS-CoV-2 viral copies this assay can detect is 250 copies / mL. A negative result does not preclude SARS-CoV-2 infection and should not be used as the sole basis for treatment or other patient management  decisions.  A negative result may occur with improper specimen collection / handling, submission of specimen other than nasopharyngeal swab, presence of viral mutation(s) within the areas targeted by this assay, and inadequate number of viral copies (<250 copies / mL). A negative result must be combined with clinical observations, patient history, and epidemiological information. Fact Sheet for Patients:   StrictlyIdeas.no Fact Sheet for Healthcare Providers: BankingDealers.co.za This test is not yet approved or cleared  by the [place] FDA and has been authorized for detection and/or diagnosis of SARS-CoV-2 by FDA under an Emergency Use Authorization (EUA).  This EUA will remain in effect (meaning this test can be used) for the duration of the COVID-19 declaration under Section 564(b)(1) of the Act, 21 U.S.C. section 360bbb-3(b)(1), unless the authorization is terminated or revoked sooner. Performed at [facility], [place] [address], [place], [place] [address]   Urine culture     Status: None   Collection Time: 12/27/19  4:44 AM   Specimen: Urine, Clean Catch  Result Value Ref Range Status   Specimen Description   Final    URINE, CLEAN CATCH Performed at [facility], [place] [address] [place], [place] [address]    Special Requests   Final    NONE Performed at [facility], [place] [address] [place], [place] [address]    Culture   Final    NO GROWTH Performed at [facility], [place] [address] [place], [place] [address]    Report Status 12/28/2019 FINAL  Final  Blood culture (routine x 2)     Status: None (Preliminary result)   Collection Time: 12/27/19  7:01 AM   Specimen: BLOOD  Result Value Ref Range Status   Specimen Description   Final    BLOOD RIGHT ANTECUBITAL Performed at [place] [address], [place], [place] [address]    Special  Requests   Final    BOTTLES DRAWN AEROBIC AND ANAEROBIC Blood Culture adequate volume Performed at [place] [address] [place], [place] [address]    Culture   Final    NO GROWTH < 24 HOURS Performed at [place] [address] [place], [place] [address]    Report Status PENDING  Incomplete  Blood culture (routine x 2)     Status: None (Preliminary result)   Collection Time: 12/27/19  7:27 AM   Specimen: BLOOD  Result Value Ref Range Status   Specimen Description   Final    BLOOD LEFT ANTECUBITAL Performed at [place] [address]  [place], [place] [address]    Special Requests   Final    BOTTLES DRAWN AEROBIC AND ANAEROBIC Blood Culture adequate volume Performed at [place] [address], [place], [place] [address]    Culture   Final    NO GROWTH < 24 HOURS Performed at [place] [address], [place], [place] [address]    Report Status PENDING  Incomplete         Radiology Studies: CT Head Wo Contrast  Result Date: 12/27/2019 CLINICAL DATA:  Altered mental status. EXAM: CT HEAD WITHOUT CONTRAST TECHNIQUE: Contiguous axial images were obtained from the base of the skull through the vertex without intravenous contrast. COMPARISON:  Head CT 12/09/2019 FINDINGS: Brain: The ventricles are normal in size and configuration. No extra-axial fluid collections are identified. The gray-white differentiation is maintained. No CT findings for acute hemispheric infarction or intracranial hemorrhage. No mass lesions. The brainstem and cerebellum are normal. Vascular: Vascular calcifications, advanced for age but no aneurysm or hyperdense vessels. Skull: No acute skull fracture. No bone lesion. Sinuses/Orbits: Stable severe chronic left maxillary and left half sphenoid sinus disease with partially calcified inspissated debris also extending into the left nasal cavity. Sinus wall thickening noted.  The other paranasal sinuses and mastoid air cells are clear. The globes are intact. Other: No scalp lesions, laceration or hematoma. IMPRESSION: 1. No acute intracranial findings or mass lesions. 2. Stable severe chronic left maxillary and left half sphenoid sinus disease. Electronically Signed   By: Marijo Sanes M.D.   On: 12/27/2019 07:05   US PELVIS (TRANSABDOMINAL ONLY)  Result Date: 12/27/2019 CLINICAL DATA:  Prostatitis EXAM: ULTRASOUND OF THE MALE PELVIS COMPARISON:  None. FINDINGS: Bladder: Normal. Prevoid bladder volume 97 cc. Postvoid bladder volume not obtained as patient was sleeping during the exam. Prostate gland:  2.6 x 2.7 x 3.3 cm with a volume of 11.8 cc. Seminal vesicles:  Normal IMPRESSION: 1. Normal exam. Electronically Signed   By: Kerby Moors M.D.   On: 12/27/2019 09:47   DG Chest Port 1 View  Result Date: 12/27/2019 CLINICAL DATA:  Weakness and lethargy EXAM: PORTABLE CHEST 1 VIEW COMPARISON:  12/15/2019 FINDINGS: Stable borderline heart size and mildly tortuous aortic contours. Mild interstitial crowding at the bases. There is no edema, consolidation, effusion, or pneumothorax. IMPRESSION: No acute finding. Electronically Signed   By: Monte Fantasia M.D.   On: 12/27/2019 05:31   DG Knee Right Port  Result Date: 12/27/2019 CLINICAL DATA:  [place] 1 week ago. Persistent knee pain. EXAM: PORTABLE RIGHT KNEE - 1-2 VIEW COMPARISON:  None. FINDINGS: Mild tricompartmental degenerative changes but no acute bony findings or osteochondral lesion. No joint effusion. Age advanced vascular calcifications are noted. IMPRESSION: Mild degenerative changes but no acute bony findings or joint effusion. Age advanced vascular calcifications. Electronically Signed   By: Marijo Sanes M.D.   On: 12/27/2019 07:06        Scheduled Meds: . allopurinol  200 mg Oral QPM  . aspirin EC  81 mg Oral QHS  . atorvastatin  40 mg Oral Daily  . famotidine  20 mg Oral BID  . fluticasone  2 spray Each  Nare Daily  . heparin  5,000 Units Subcutaneous Q8H  . potassium chloride SA  20 mEq Oral Daily  . predniSONE  5 mg Oral Daily   Continuous Infusions: . sodium chloride 50 mL/hr at 12/28/19 [id number]  . cefTRIAXone (ROCEPHIN)  IV 1 g (12/28/19 [id number])  LOS: 1 day    Time spent: 25 mins.More than 50% of that time was spent in counseling and/or coordination of care.      Shelly Coss, MD [facility] P5/19/2021, 8:35 AM

## 2019-12-28 NOTE — Progress Notes (Addendum)
Pt received Tylenol for temperature and Labetalol  For BP .Reassessed Pt VS- Pt Temperature 99.0 and BP is 144/80. Will continue to monitor .

## 2019-12-28 NOTE — Progress Notes (Signed)
Pt declined cpap tonight.  Pt was encouraged to call should he change his mind. 

## 2019-12-28 NOTE — TOC Initial Note (Signed)
Transition of Care [facility]) - Initial/Assessment Note    Patient Details  Name: Marco [name] MRN: [id number] Date of Birth: [birth year]  Transition of [facility] CM/SW Contact:    Dessa Phi, RN Phone Number: 12/28/2019, 1:38 PM  Clinical Narrative:  Patient recc for HHPT-no [place] agency able to accept d/t insurance-will set up otpt PT referral-MD notified.[employer] following for rw to deliver to rm prior d/c.                 Expected Discharge Plan: [place] Barriers to Discharge: No [place] will accept this patient   Patient Goals and CMS Choice Patient states their goals for this hospitalization and ongoing recovery are:: go home CMS Medicare.gov Compare Post Acute Care list provided to:: Patient Choice offered to / list presented to : Patient  Expected Discharge Plan and Services Expected Discharge Plan: [place]   Discharge Planning Services: CM Consult   Living arrangements for the past 2 months: Single Family Home Expected Discharge Date: (unknown)               DME Arranged: [place] DME Agency: [employer] Date DME Agency Contacted: 12/28/19 Time DME Agency ContactedLJ:[id number] Representative spoke with at DME Agency: Thedore Mins            Prior Living Arrangements/Services Living arrangements for the past 2 months: [place] with:: Self Patient language and need for interpreter reviewed:: Yes Do you feel safe going back to the place where you live?: Yes      Need for Family Participation in Patient Care: No (Comment) Care giver support system in place?: Yes (comment)   Criminal Activity/Legal Involvement Pertinent to Current Situation/Hospitalization: No - Comment as needed  Activities of Daily Living Home Assistive Devices/Equipment: Eyeglasses, CBG Meter, CPAP ADL Screening (condition at time of admission) Patient's cognitive ability adequate to safely complete daily activities?: No(patient very  drowsy) Is the patient deaf or have difficulty hearing?: No Does the patient have difficulty seeing, even when wearing glasses/contacts?: No Does the patient have difficulty concentrating, remembering, or making decisions?: Yes Patient able to express need for assistance with ADLs?: Yes Does the patient have difficulty dressing or bathing?: Yes Independently performs ADLs?: No Communication: Independent Dressing (OT): Needs assistance Is this a change from baseline?: Change from baseline, expected to last >3 days Grooming: Needs assistance Is this a change from baseline?: Change from baseline, expected to last >3 days Feeding: Needs assistance Is this a change from baseline?: Change from baseline, expected to last >3 days Bathing: Needs assistance Is this a change from baseline?: Change from baseline, expected to last >3 days Toileting: Needs assistance Is this a change from baseline?: Change from baseline, expected to last >3days In/Out Bed: Needs assistance Is this a change from baseline?: Change from baseline, expected to last >3 days Walks in Home: Needs assistance Is this a change from baseline?: Change from baseline, expected to last >3 days Does the patient have difficulty walking or climbing stairs?: Yes(secondary to weakness and drowsiness) Weakness of Legs: Both Weakness of Arms/Hands: None  Permission Sought/Granted Permission sought to share information with : Case Manager Permission granted to share information with : Yes, Verbal Permission Granted  Share Information with NAME: Case Manager           Emotional Assessment Appearance:: Appears stated age Attitude/Demeanor/Rapport: Gracious Affect (typically observed): Accepting Orientation: : Oriented to Self, Oriented to Place, Oriented to  Time, Oriented  to Situation Alcohol / Substance Use: Not Applicable Psych Involvement: No (comment)  Admission diagnosis:  UTI (urinary tract infection) [N39.0] Weakness  [R53.1] Acute febrile illness [R50.9] Sepsis ([place]) [A41.9] Patient Active Problem List   Diagnosis Date Noted  . Sepsis ([place]) 12/27/2019  . Severe sepsis ([place]) 12/10/2019  . Acute metabolic encephalopathy 0000000  . Hyperkalemia 12/09/2019  . Sepsis due to Gram negative bacteria ([place]) 12/09/2019  . Rhinitis 10/16/2019  . Gastroesophageal reflux disease without esophagitis 08/31/2018  . Muscle spasm 08/31/2018  . NICM (nonischemic cardiomyopathy) ([place]) 07/06/2018  . Hypokalemia   . Acute renal failure superimposed on stage 3 chronic kidney disease ([place]) 08/27/2017  . Hyperlipidemia 12/23/2016  . Chronic combined systolic and diastolic CHF (congestive heart failure) ([place]) 09/09/2016  . Diabetic polyneuropathy associated with type 2 diabetes mellitus ([place]) 06/10/2016  . Vision changes 06/02/2016  . Myalgia and myositis 03/31/2016  . Shortness of breath 02/09/2016  . Chronic gouty arthritis 11/29/2015  . LBP (low back pain) 11/27/2015  . Arthralgia of multiple joints 11/27/2015  . 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[place]) 11/27/2015  . Breast pain in male 11/14/2015  . Sinusitis, chronic 10/16/2015  . Insomnia 10/16/2015  . New onset of headaches after age 24 09/20/2015  . OSA (obstructive sleep apnea) 07/12/2015  . Low serum testosterone level 05/18/2015  . Depression 05/16/2015  . Morbid obesity due to excess calories ([place])   . Chronic pain   . Type 2 diabetes mellitus with stage 3 chronic kidney disease, without long-term current use of insulin ([place])   . CAD (coronary artery disease) 12/24/2014  . Atrial fibrillation ([place]) 12/24/2014  . Essential hypertension 12/24/2014  . Chronic kidney disease 12/24/2014  . PUD (peptic ulcer disease) 12/24/2014   PCP:  [name], MD Pharmacy:   [place], [place] - [address] [place] [address] Phone: [phone] Fax: [phone]  [place], [place] - [address] [place] [address] Phone: [phone] Fax: [phone]     Social Determinants of Health (SDOH) Interventions    Readmission Risk Interventions Readmission Risk Prevention Plan 12/28/2019  Transportation Screening Complete  PCP or Specialist Appt within 3-5 Days Complete  HRI or [place] Complete  Social Work Consult for [place] Planning/Counseling Complete  Palliative Care Screening Not Applicable  Medication Review Press photographer) Complete  Some recent data might be hidden

## 2019-12-28 NOTE — Progress Notes (Signed)
Pt has c/o of SOB. VS take.  Temp.101.8 with BP 162/79.  Upon Auscultation noted mild crackles to lower rt lobes. O2 sats 94% . Made on call attending aware of abn. VS. Order given to decrease IVF from 100 to  50cc/hr, start Hydralazine. Gave prn Tylenol. Will make day shift nurse aware. Recommend to restart home medication. Will continue to monitor.

## 2019-12-29 ENCOUNTER — Inpatient Hospital Stay (HOSPITAL_COMMUNITY): Payer: Medicaid Other

## 2019-12-29 LAB — CBC WITH DIFFERENTIAL/PLATELET
Abs Immature Granulocytes: 0.16 10*3/uL — ABNORMAL HIGH (ref 0.00–0.07)
Basophils Absolute: 0 10*3/uL (ref 0.0–0.1)
Basophils Relative: 0 %
Eosinophils Absolute: 0.2 10*3/uL (ref 0.0–0.5)
Eosinophils Relative: 2 %
HCT: 31.1 % — ABNORMAL LOW (ref 39.0–52.0)
Hemoglobin: 10 g/dL — ABNORMAL LOW (ref 13.0–17.0)
Immature Granulocytes: 1 %
Lymphocytes Relative: 16 %
Lymphs Abs: 2 10*3/uL (ref 0.7–4.0)
MCH: 29.2 pg (ref 26.0–34.0)
MCHC: 32.2 g/dL (ref 30.0–36.0)
MCV: 90.7 fL (ref 80.0–100.0)
Monocytes Absolute: 2 10*3/uL — ABNORMAL HIGH (ref 0.1–1.0)
Monocytes Relative: 15 %
Neutro Abs: 8.4 10*3/uL — ABNORMAL HIGH (ref 1.7–7.7)
Neutrophils Relative %: 66 %
Platelets: 256 10*3/uL (ref 150–400)
RBC: 3.43 MIL/uL — ABNORMAL LOW (ref 4.22–5.81)
RDW: 16.1 % — ABNORMAL HIGH (ref 11.5–15.5)
WBC: 12.8 10*3/uL — ABNORMAL HIGH (ref 4.0–10.5)
nRBC: 0.2 % (ref 0.0–0.2)

## 2019-12-29 LAB — BASIC METABOLIC PANEL
Anion gap: 9 (ref 5–15)
BUN: 12 mg/dL (ref 8–23)
CO2: 25 mmol/L (ref 22–32)
Calcium: 8 mg/dL — ABNORMAL LOW (ref 8.9–10.3)
Chloride: 104 mmol/L (ref 98–111)
Creatinine, Ser: 1.48 mg/dL — ABNORMAL HIGH (ref 0.61–1.24)
GFR calc Af Amer: 57 mL/min — ABNORMAL LOW (ref >60)
GFR calc non Af Amer: 49 mL/min — ABNORMAL LOW (ref >60)
Glucose, Bld: 91 mg/dL (ref 70–99)
Potassium: 4.5 mmol/L (ref 3.5–5.1)
Sodium: 138 mmol/L (ref 135–145)

## 2019-12-29 LAB — GLUCOSE, CAPILLARY: Glucose-Capillary: 109 mg/dL — ABNORMAL HIGH (ref 70–99)

## 2019-12-29 MED ORDER — ADULT MULTIVITAMIN W/MINERALS CH
1.0000 | ORAL_TABLET | Freq: Every day | ORAL | Status: DC
  Administered 2019-12-29 – 2019-12-30 (×2): 1 via ORAL
  Filled 2019-12-29 (×2): qty 1

## 2019-12-29 MED ORDER — SACUBITRIL-VALSARTAN 49-51 MG PO TABS
1.0000 | ORAL_TABLET | Freq: Two times a day (BID) | ORAL | Status: DC
  Administered 2019-12-29 – 2019-12-30 (×3): 1 via ORAL
  Filled 2019-12-29 (×3): qty 1

## 2019-12-29 MED ORDER — JUVEN PO PACK
1.0000 | PACK | Freq: Two times a day (BID) | ORAL | Status: DC
  Administered 2019-12-29 – 2020-01-07 (×7): 1 via ORAL
  Filled 2019-12-29 (×14): qty 1

## 2019-12-29 MED ORDER — VANCOMYCIN HCL 2000 MG/400ML IV SOLN
2000.0000 mg | Freq: Once | INTRAVENOUS | Status: AC
  Administered 2019-12-29: 2000 mg via INTRAVENOUS
  Filled 2019-12-29: qty 400

## 2019-12-29 MED ORDER — SODIUM CHLORIDE 0.9 % IV SOLN
INTRAVENOUS | Status: DC | PRN
  Administered 2019-12-29: 250 mL via INTRAVENOUS

## 2019-12-29 MED ORDER — SODIUM CHLORIDE 0.9 % IV SOLN
2.0000 g | Freq: Three times a day (TID) | INTRAVENOUS | Status: DC
  Administered 2019-12-29 – 2019-12-31 (×7): 2 g via INTRAVENOUS
  Filled 2019-12-29 (×8): qty 2

## 2019-12-29 MED ORDER — PRO-STAT SUGAR FREE PO LIQD
30.0000 mL | Freq: Two times a day (BID) | ORAL | Status: DC
  Administered 2019-12-29 – 2020-01-01 (×2): 30 mL via ORAL
  Filled 2019-12-29 (×2): qty 30

## 2019-12-29 MED ORDER — ENSURE ENLIVE PO LIQD
237.0000 mL | Freq: Two times a day (BID) | ORAL | Status: DC
  Administered 2019-12-29 – 2019-12-30 (×2): 237 mL via ORAL

## 2019-12-29 MED ORDER — VANCOMYCIN HCL 750 MG/150ML IV SOLN
750.0000 mg | Freq: Two times a day (BID) | INTRAVENOUS | Status: DC
  Administered 2019-12-30 – 2019-12-31 (×3): 750 mg via INTRAVENOUS
  Filled 2019-12-29 (×5): qty 150

## 2019-12-29 NOTE — Progress Notes (Signed)
Patient observed to be panicky, anxious and requesting to leave. Assessed pain level.   Patient c/o of mid chest pain with sob . Pain rate pain 6/10. 12 lead ekg complete- NSR. No change from previous EKG. O2 initiated via [place] 1 L sat 96%. BP 155/77, Temp 99.5.  Patient is resting in the bed, stating "I feel better".  Will making oncall attending aware. Will cont to monitor.

## 2019-12-29 NOTE — Progress Notes (Signed)
Pharmacy Antibiotic Note  Marco [name] is a 64 y.o. male admitted on 12/27/2019 with sepsis.  Pharmacy has been consulted for vancomycin and cefepime dosing.  Plan:  Cefepime 2 gr IV q8h    Vancomycin 2000 mg IV x1 then 750 mg IV q12h   Monitor clinical course, renal function, cultures as available    Height: 5\' 6"  (167.6 cm) Weight: 108.3 kg (238 lb 12.1 oz) IBW/kg (Calculated) : 63.8  Temp (24hrs), Avg:100 F (37.8 C), Min:99.5 F (37.5 C), Max:100.5 F (38.1 C)  Recent Labs  Lab 12/26/19 2321 12/27/19 0730 12/27/19 1008 12/28/19 0440 12/29/19 0526  WBC 13.3*  --   --  12.2* 12.8*  CREATININE 1.83*  --   --  1.56* 1.48*  LATICACIDVEN  --  2.5* 1.1  --   --     Estimated Creatinine Clearance: 58.2 mL/min (A) (by C-G formula based on SCr of 1.48 mg/dL (H)).    Allergies  Allergen Reactions  . Nsaids Other (See Comments)    Stomach pains. Ulcers - stated by patient     Antimicrobials this admission: 5/18 ceftriaxone >> 5/20 5/20 cefepime >>  5/20 vancomycin >>   Dose adjustments this admission:   Microbiology results: 5/18 BCx: NGTD 5/18 UCx: NGF  5/20 BCx:   Thank you for allowing pharmacy to be a part of this patient's care.    Royetta Asal, PharmD, BCPS 12/29/2019 1:45 PM

## 2019-12-29 NOTE — Progress Notes (Signed)
Initial Nutrition Assessment  RD working remotely.   DOCUMENTATION CODES:   Obesity unspecified  INTERVENTION:  - will order Ensure Enlive po BID, each supplement provides 350 kcal and 20 grams of protein - will order 30 ml Prostat BID, each supplement will provide 100 kcal with 15 grams protein. - will order 1 tablet multivitamin with minerals. - will order Juven BID, each packet provides 95 calories, 2.5 grams of protein (collagen), and 9.8 grams of carbohydrate (3 grams sugar); also contains 7 grams of L-arginine and L-glutamine, 300 mg vitamin C, 15 mg vitamin E, 1.2 mcg vitamin B-12, 9.5 mg zinc, 200 mg calcium, and 1.5 g  Calcium Beta-hydroxy-Beta-methylbutyrate to support wound healing.  NUTRITION DIAGNOSIS:   Increased nutrient needs related to acute illness, wound healing as evidenced by estimated needs.  GOAL:   Patient will meet greater than or equal to 90% of their needs  MONITOR:   PO intake, Supplement acceptance, Labs, Weight trends, Skin  REASON FOR ASSESSMENT:   Other (Comment)(Pressure Injury report)  ASSESSMENT:   64 y.o. male with medical history of CHF, non-ischemic cardiomyopathy, stage 3 CKD, type 2 DM, CAD, HTN, obesity, OSA, A. fib s/p ablation in 2017, and psoriatic arthritis on daily prednisone. He presented to the ED with complaints of weakness, lethargy, and multiple falls at home. He was admitted earlier in the month for pyelonephritis. Since discharge he has been experiencing urinary frequency, dysuria, and incontinence. He is being admitted for UTI/prostatitis.  Per chart review, patient consumed 75% of lunch on 5/18 and 25% of lunch on 5/19. No other intakes documented since admission. Weight on 5/17 was 243 lb and weight on 5/18 was 239 lb. Weight has been 238-245 lb since 07/29/2019.   Will order supplements as outlined above given wounds.   Per notes: - suspected sepsis - recurrent UTI, suspected prostatitis, persistent dysuria - AKI on  stage 3 CKD - generalized weakness d/t deconditioning   Labs reviewed; CBG: 109 mg/dl, creatinine: 1.48 mg/dl, Ca: 8 mg/dl, GFR: 57 ml/min. Medications reviewed; 20 mg oral pepcid BID, 40 mg oral lasix/day, 20 mEq Klor-Con/day, 5 mg deltasone/day.    NUTRITION - FOCUSED PHYSICAL EXAM:  unable to complete at this time.   Diet Order:   Diet Order            Diet Carb Modified Fluid consistency: Thin; Room service appropriate? Yes  Diet effective now              EDUCATION NEEDS:   No education needs have been identified at this time  Skin:  Skin Assessment: Skin Integrity Issues: Skin Integrity Issues:: Unstageable Unstageable: full thickness to bilateral buttocks  Last BM:  5/18  Height:   Ht Readings from Last 1 Encounters:  12/27/19 5\' 6"  (1.676 m)    Weight:   Wt Readings from Last 1 Encounters:  12/27/19 108.3 kg    Estimated Nutritional Needs:  Kcal:  1700-1900 kcal Protein:  85-100 grams Fluid:  >/= 2 L/day     Jarome Matin, MS, RD, LDN, CNSC Inpatient Clinical Dietitian RD pager # available in [facility]  After hours/weekend pager # available in [facility]

## 2019-12-29 NOTE — Progress Notes (Signed)
Physical Therapy Treatment Patient Details Name: Marco [name] MRN: [id number] DOB: 27-Jun-1956 Today's Date: 12/29/2019    History of Present Illness 64 y.o. male with medical history significant of combined congestive heart failure, nonischemic cardiomyopathy, CKD stage III, diabetes type 2, coronary artery disease, hypertension, obesity, OSA A. fib status post ablation in 2017, psoriatic arthritis on daily prednisone therapy who presents to the emergency department complaints of weakness, lethargic, falls at home.  He was recently admitted and was discharged earlier this month after being managed for  pyelonephritis.  Patient was noticing increasing weakness after discharge to home.  He also fell multiple times at home. Pt admitted for possible sepsis and recurrent UTI    PT Comments    Pt was OOB in recliner with spouse visiting.  General Comments: sleepy, flat I had to ask multiple questions about how he was feeling during session.  Assisted with amb in hallway.  General transfer comment: slow.  Increased time for pt to initiate.  25% VC's on safety and proper hand placement.General Gait Details: very slow sluggish gait.  Pt offered very little information about how he felt.  I had to ask multiple questions.  Avg HR was 74 and RA 95%.  Pt finally did say he was VERY Tired"  Oh and half way up the hall he asked for his shoes.  "my feet hurt".  Assisted abck to bed and positioned to comfort.  Pt plans to return home with spouse.   Follow Up Recommendations  Home health PT;Supervision for mobility/OOB     Equipment Recommendations  Rolling walker with 5" wheels    Recommendations for Other Services       Precautions / Restrictions Precautions Precautions: Fall Restrictions Weight Bearing Restrictions: No    Mobility  Bed Mobility Overal bed mobility: Needs Assistance Bed Mobility: Sit to Supine       Sit to supine: Mod assist;Max assist   General bed mobility comments: required  much assist to support B LE's up onto bed  Transfers Overall transfer level: Needs assistance Equipment used: Rolling walker (2 wheeled) Transfers: Sit to/from Stand Sit to Stand: Min guard         General transfer comment: slow.  Increased time for pt to initiate.  25% VC's on safety and proper hand placement.  Ambulation/Gait Ambulation/Gait assistance: Min guard;Min assist Gait Distance (Feet): 55 Feet Assistive device: Rolling walker (2 wheeled) Gait Pattern/deviations: Step-through pattern;Decreased stride length Gait velocity: decreased   General Gait Details: very slow sluggish gait.  Pt offered very little information about how he felt.  I had to ask multiple questions.  Avg HR was 74 and RA 95%.  Pt finally did say he was VERY Tired"  Oh and half way up the hall he asked for his shoes.  "my feet hurt".   Stairs             Wheelchair Mobility    Modified Rankin (Stroke Patients Only)       Balance                                            Cognition Arousal/Alertness: Awake/alert Behavior During Therapy: Flat affect Overall Cognitive Status: Within Functional Limits for tasks assessed  General Comments: sleepy, flat I had to ask multiple questions about how he was feeling during session      Exercises      General Comments        Pertinent Vitals/Pain Pain Assessment: No/denies pain    Home Living                      Prior Function            PT Goals (current goals can now be found in the care plan section) Progress towards PT goals: Progressing toward goals    Frequency           PT Plan Current plan remains appropriate    Co-evaluation              AM-PAC PT "6 Clicks" Mobility   Outcome Measure  Help needed turning from your back to your side while in a flat bed without using bedrails?: A Little Help needed moving from lying on your back to  sitting on the side of a flat bed without using bedrails?: A Little Help needed moving to and from a bed to a chair (including a wheelchair)?: A Little Help needed standing up from a chair using your arms (e.g., wheelchair or bedside chair)?: A Little Help needed to walk in hospital room?: A Little Help needed climbing 3-5 steps with a railing? : A Lot 6 Click Score: 17    End of Session Equipment Utilized During Treatment: Gait belt Activity Tolerance: Patient limited by fatigue Patient left: in bed;with bed alarm set;with call bell/phone within reach;with family/visitor present Nurse Communication: Mobility status PT Visit Diagnosis: Difficulty in walking, not elsewhere classified (R26.2)     Time: [id number] PT Time Calculation (min) (ACUTE ONLY): 25 min  Charges:  $Gait Training: 8-22 mins $Therapeutic Activity: 8-22 mins                     {Arno Cullers  PTA Acute  Rehabilitation Services Pager      [phone] Office      [phone]

## 2019-12-29 NOTE — Consult Note (Signed)
Urology Consult Note   Requesting Attending Physician:  Shelly Coss, MD Service Providing Consult: Urology  Consulting Attending: Dr. Festus Aloe   Reason for Consult:  Pyelonephritis  HPI: Marco [name] is seen in consultation for reasons noted above at the request of Shelly Coss, MD for evaluation of pyelonephritis.  This is a 64 y.o. male with A. fib, diabetes on insulin, chronic kidney disease, congestive heart failure, coronary artery disease, hypertension, arthritis on chronic prednisone, obesity, urethral stricture, nephrolithiasis, recently treated for right pyelonephritis who returns with ongoing weakness.  Prior Urologic History: Patient initially seen by Dr. Karsten Ro in May 2017 for hypogonadism, noted to have bilateral testicular atrophy at that time.  When seen in March 2018, noted to have lower urinary tract symptoms as well as dysuria.  Negative urine culture at that time.  Treated for prostatitis.  PVR was 22 mL.  Seen several times in the clinic after that with persistent but vague dysuria, treated with alpha-block when seen in August 2019, given persistent symptoms, he underwent cystourethroscopy with dilation of a moderate fossa navicularis stricture using Leander Rams sounds. Followup PVR in September 2020 was 64 cc.  PSA 1.2 (10/20/2018)  Presented approximately 3 weeks ago with altered mental status, hypotension and diagnosed with right pyelonephritis treated with ceftriaxone and discharged on cefdinir after urine culture returned with E. coli.  Returned approximately 1 week later with fevers and weakness but also hypoglycemia that improved his symptoms after treatment.  Now admitted on May 18 with ongoing weakness.  Has had persistent low-grade fevers and mild leukocytosis since being started on antibiotics.  Blood cultures have not been previously positive.  While initial urine culture on 12/08/2019 did result with MDR E. coli, repeat urine cultures on 12/15/2019 and  12/27/2019 were without growth.  Today, is able to provide a limited history.  He appears in some distress.  Denies any gross hematuria.  Does deny any recent stone passage.  Reports coming in for weakness.  Continues to endorse vague symptoms of dysuria although these have not changed.  He thinks that he is emptying his bladder well.  He has had a low-grade fever as recently as this morning.  He appears weak, slightly confused and rocking back and forth in his chair.  He does not appear acutely ill.  Past Medical History: Past Medical History:  Diagnosis Date  . Benign colon polyp 08/01/2013   [facility] in [place]. large base tranverse colon polyp was biopsied. polyp was benign with minimal surface hyperplastic change.  . Chronic combined systolic and diastolic CHF (congestive heart failure) ([place])   . Chronic pain   . CKD (chronic kidney disease), stage III   . Diabetes mellitus without complication ([place])   . Diverticulosis   . Esophageal hiatal hernia 07/29/2013   confirmed on EGD   . Esophageal stricture   . Essential hypertension   . Gastritis 07/29/2013   confirmed on EGD, bx done an negative for intestinal metaplasia, dsyplasia or H. pylori. normal gastric emptying study done 07/13/2013.  [name] GERD (gastroesophageal reflux disease)   . Morbid obesity ([place])   . Non-obstructive CAD    a. 02/2013 Cath ([place]): nonobs dzs;  b. 09/2014 Myoview ([place]): EF 55%, no ischemia;  c. Cath 05/2018 mild CAD no obstruction.  . Persistent atrial fibrillation ([place])    a. 02/2013 s/p rfca in [place], [place]-->prev on Xarelto, d/c'd 2/2 anemia, ? GIB.  [name] Rheumatoid arthritis ([place])   . Sigmoid diverticulosis 08/01/2013  confirmed on colonscopy. record scanned into chart    Past Surgical History:  Past Surgical History:  Procedure Laterality Date  . CARDIAC CATHETERIZATION  05/2014   ablation for atrial fibrillation  . CARDIAC CATHETERIZATION N/A 11/16/2015   Procedure: Left Heart Cath and  Coronary Angiography;  Surgeon: Leonie Man, MD;  Location: [place] [facility];  Service: Cardiovascular;  Laterality: N/A;  . COLON RESECTION  09/2013   due to large, abnormal polpy. non cancerous per patient.   . COLON SURGERY  09/2014   colon resection   . LEFT HEART CATH AND CORONARY ANGIOGRAPHY N/A 05/19/2018   Procedure: LEFT HEART CATH AND CORONARY ANGIOGRAPHY;  Surgeon: Wellington Hampshire, MD;  Location: [place] [facility];  Service: Cardiovascular;  Laterality: N/A;    Medication: Current Facility-Administered Medications  Medication Dose Route Frequency Provider Last Rate Last Admin  . 0.9 %  sodium chloride infusion   Intravenous PRN Shelly Coss, MD 10 mL/hr at 12/29/19 0658 250 mL at 12/29/19 0658  . acetaminophen (TYLENOL) tablet 650 mg  650 mg Oral Q6H PRN Lang Snow, FNP   650 mg at 12/29/19 0535  . albuterol (PROVENTIL) (2.5 MG/3ML) 0.083% nebulizer solution 2.5 mg  2.5 mg Inhalation Q6H PRN Shelly Coss, MD      . allopurinol (ZYLOPRIM) tablet 200 mg  200 mg Oral QPM Adhikari, Amrit, MD   200 mg at 12/29/19 1757  . aspirin EC tablet 81 mg  81 mg Oral QHS Shelly Coss, MD   81 mg at 12/28/19 2115  . atorvastatin (LIPITOR) tablet 40 mg  40 mg Oral Daily Shelly Coss, MD   40 mg at 12/29/19 1010  . carvedilol (COREG) tablet 12.5 mg  12.5 mg Oral BID WC Shelly Coss, MD   12.5 mg at 12/29/19 1757  . ceFEPIme (MAXIPIME) 2 g in sodium chloride 0.9 % 100 mL IVPB  2 g Intravenous Q8H Adhikari, Amrit, MD 200 mL/hr at 12/29/19 1427 2 g at 12/29/19 1427  . famotidine (PEPCID) tablet 20 mg  20 mg Oral BID Shelly Coss, MD   20 mg at 12/29/19 1010  . feeding supplement (ENSURE ENLIVE) (ENSURE ENLIVE) liquid 237 mL  237 mL Oral BID BM Adhikari, Amrit, MD   237 mL at 12/29/19 1337  . feeding supplement (PRO-STAT SUGAR FREE 64) liquid 30 mL  30 mL Oral BID Shelly Coss, MD   30 mL at 12/29/19 2015  . fluticasone (FLONASE) 50 MCG/ACT nasal spray 2 spray  2 spray Each  Nare Daily Adhikari, Amrit, MD      . furosemide (LASIX) tablet 40 mg  40 mg Oral Daily Adhikari, Amrit, MD   40 mg at 12/29/19 1010  . heparin injection 5,000 Units  5,000 Units Subcutaneous Q8H Shelly Coss, MD   5,000 Units at 12/29/19 1337  . multivitamin with minerals tablet 1 tablet  1 tablet Oral Daily Shelly Coss, MD   1 tablet at 12/29/19 1423  . nutrition supplement (JUVEN) (JUVEN) powder packet 1 packet  1 packet Oral BID BM Shelly Coss, MD   1 packet at 12/29/19 1757  . oxyCODONE (Oxy IR/ROXICODONE) immediate release tablet 5 mg  5 mg Oral Q6H PRN Shelly Coss, MD   5 mg at 12/29/19 2015  . phenazopyridine (PYRIDIUM) tablet 100 mg  100 mg Oral TID WC Shelly Coss, MD   100 mg at 12/29/19 1756  . polyethylene glycol (MIRALAX / GLYCOLAX) packet 17 g  17 g Oral Daily PRN Adhikari,  Amrit, MD      . potassium chloride SA (KLOR-CON) CR tablet 20 mEq  20 mEq Oral Daily Shelly Coss, MD   20 mEq at 12/29/19 1010  . predniSONE (DELTASONE) tablet 5 mg  5 mg Oral Daily Shelly Coss, MD   5 mg at 12/29/19 1010  . sacubitril-valsartan (ENTRESTO) 49-51 mg per tablet  1 tablet Oral BID Shelly Coss, MD   1 tablet at 12/29/19 1756  . [START ON 12/30/2019] vancomycin (VANCOREADY) IVPB 750 mg/150 mL  750 mg Intravenous Q12H Shelly Coss, MD        Allergies: Allergies  Allergen Reactions  . Nsaids Other (See Comments)    Stomach pains. Ulcers - stated by patient     Social History: Social History   Tobacco Use  . Smoking status: Former Smoker    Packs/day: 0.50    Years: 10.00    Pack years: 5.00    Types: Cigarettes    Quit date: 04/11/2008    Years since quitting: 11.7  . Smokeless tobacco: Never Used  Substance Use Topics  . Alcohol use: No    Alcohol/week: 0.0 standard drinks  . Drug use: No    Family History Family History  Problem Relation Age of Onset  . Hypertension Mother   . Diabetes Mother   . COPD Mother   . Lung cancer Mother         Smoker   . Hypertension Father   . Diabetes Father   . Heart Problems Father   . COPD Father   . Colon cancer Neg Hx   . Stomach cancer Neg Hx     Review of Systems 10 systems were reviewed and are negative except as noted specifically in the HPI.  Objective   Vital signs in last 24 hours: BP (!) 150/79 (BP Location: Right Arm)   Pulse 75   Temp 99.2 F (37.3 C) (Oral)   Resp 18   Ht 5\' 6"  (1.676 m)   Wt 108.3 kg   SpO2 93%   BMI 38.54 kg/m   Physical Exam General: NAD, A&O Abdomen: Soft, NTTP, nondistended, obese GU: Voiding spontaneously, mild right CVA tenderness, urine orange, condom cath Extremities: warm and well perfused Neuro: Appropriate, no focal neurological deficits  Most Recent Labs: Lab Results  Component Value Date   WBC 12.8 (H) 12/29/2019   HGB 10.0 (L) 12/29/2019   HCT 31.1 (L) 12/29/2019   PLT 256 12/29/2019    Lab Results  Component Value Date   NA 138 12/29/2019   K 4.5 12/29/2019   CL 104 12/29/2019   CO2 25 12/29/2019   BUN 12 12/29/2019   CREATININE 1.48 (H) 12/29/2019   CALCIUM 8.0 (L) 12/29/2019   MG 2.5 (H) 12/10/2019   PHOS 5.2 (H) 12/09/2019    Lab Results  Component Value Date   INR 1.3 (H) 12/08/2019   APTT 33 12/08/2019     Urine Culture: @[id number] (laburin,org,[id number],[id number])@   IMAGING: CT ABDOMEN PELVIS WO CONTRAST  Result Date: 12/28/2019 CLINICAL DATA:  Abdominal pain. EXAM: CT ABDOMEN AND PELVIS WITHOUT CONTRAST TECHNIQUE: Multidetector CT imaging of the abdomen and pelvis was performed following the standard protocol without IV contrast. COMPARISON:  Dec 15, 2019 FINDINGS: Lower chest: Mild atelectasis is seen within the posterior aspect of the bilateral lung bases. There are small bilateral pleural effusions. Hepatobiliary: Stable 8 mm focus of parenchymal low attenuation is seen within the inferior medial aspect of the right lobe of the liver. No gallstones,  gallbladder wall thickening, or biliary dilatation.  Pancreas: Unremarkable. No pancreatic ductal dilatation or surrounding inflammatory changes. Spleen: Normal in size without focal abnormality. Adrenals/Urinary Tract: Adrenal glands are unremarkable. The kidneys are normal in size. A stable 3.9 cm x 2.9 cm exophytic isodense area is seen along the posterior aspect of the upper pole of the right kidney. A moderate to marked amount of surrounding inflammatory fat stranding is seen which is increased in severity when compared to the prior study. A stable 2.2 cm exophytic cyst is seen along the anterior aspect of the mid left kidney. Bilateral 2 mm and 3 mm nonobstructing renal stones are seen. Bladder is unremarkable. Stomach/Bowel: Stomach is within normal limits. The appendix is not identified. Surgically anastomosed bowel is seen along the proximal portion of the transverse colon. No evidence of bowel dilatation. Noninflamed diverticula are seen within the descending and sigmoid colon. Vascular/Lymphatic: Moderate severity aortic calcification. No enlarged abdominal or pelvic lymph nodes. Reproductive: Prostate is unremarkable. Other: There is a 4.6 cm x 4.1 cm fat containing right inguinal hernia. A 3.6 cm x 2.7 cm fat containing left inguinal hernia is also seen. Musculoskeletal: No acute or significant osseous findings. IMPRESSION: 1. Small bilateral pleural effusions with mild bibasilar atelectasis. 2. Stable 3.9 cm x 2.9 cm exophytic isodense area along the posterior aspect of the upper pole of the right kidney with moderate to marked amount of surrounding inflammatory fat stranding. This likely represents a hemorrhagic cyst, with associated right-sided pyelonephritis. 3. Stable left renal cyst. 4. Bilateral nonobstructing renal stones. 5. Colonic diverticulosis. 6. Bilateral fat-containing inguinal hernias. Aortic Atherosclerosis (ICD10-I70.0). Electronically Signed   By: Virgina Norfolk M.D.   On: 12/28/2019 16:50   DG Chest 1 View  Result Date:  12/29/2019 CLINICAL DATA:  Shortness of breath EXAM: CHEST  1 VIEW COMPARISON:  Dec 27, 2019 FINDINGS: There is mild atelectatic change in the right lower lung region. The lungs elsewhere are clear. There is cardiomegaly with pulmonary vascularity normal. No adenopathy. No bone lesions. IMPRESSION: Mild right lower lung region atelectasis. Lungs elsewhere clear. Stable cardiomegaly. Electronically Signed   By: Lowella Grip III M.D.   On: 12/29/2019 13:42    ------  Assessment:  64 y.o. male with history of A. fib, diabetes on insulin, chronic kidney disease, congestive heart failure, coronary artery disease, hypertension, arthritis on chronic prednisone, obesity, urethral stricture, nephrolithiasis, recently treated for right pyelonephritis who returns with ongoing weakness.  Overall, incredibly complicated patient with recent complicated infection.  Given the patient's fever, mild leukocytosis while on antibiotics, CT findings, could presume that he has right pyelonephritis.  Would not exclude other etiologies for his presenting symptoms given his complex past medical history.  1) Right Pyelonephritis Presented approximately 3 weeks ago with altered mental status, hypotension and diagnosed with right pyelonephritis treated with ceftriaxone and discharged on cefdinir after urine culture returned with E. coli.  Returned approximately 1 week later with fevers and weakness but also hypoglycemia that improved his symptoms after treatment.  Now admitted on May 18 with ongoing weakness.  Has had persistent low-grade fevers and mild leukocytosis since being started on antibiotics.  Blood cultures have not been previously positive.  While initial urine culture on 12/08/2019 did result with MDR E. coli, repeat urine cultures on 12/15/2019 and 12/27/2019 were without growth.  Noted to have right upper pole cyst since RUS in 2016, 2.5cm at that time; 3.8cm on 08/2017 RUS; MRI 08/2019 favor Bosniak 1/2 cysts with  a <1cm  right upper pole subcapsular lesions unable to be characterized.  Right perinephric stranding from initial episode (CT 12/09/19), progressive on 5/6 imaging, now 12/28/19 CT a/p with ~4cm expophytic right upper pole cysto with more stranding.  12/27/19 Pelvic Ultrasound with Prostate volume of 15cc. PSAd (using 2020 PSA) is 0.08 ng/ml/cc.   Overall low suspicion for renal cell carcinoma.  May have developed pyelonephritis for unclear reasons with possible translocation of bacteria to cyst which is now infected causing persistent pyelonephritis which appears to be refractory to current antibiosis regimen  - Agree with broad spectrum antibiotics; optimize soft tissue penetration -- rec check with ID for appropriate coverage if any concerns from primary team - No procedural or operative intervention indicated at this time -- I reviewed CT with IR and there is nothing to currently drain/tap. Also, it appears he bled into the right renal cyst (higher attenuation).  - If no improvement or persistent fevers in ~72hr, would repeat CT abdomen/pelvis with IV contrast if creatinine allows to evaluate for progression to abscess and if there is a drainable fluid collection and to evaluate prostate.   Dysuria in the setting of prior urethral Stricture History of vague dysuria.  This has been documented over the past 3 years.  S/p fossa navicularus dilation in Aug 2019 as well as noted with soft wide bore bulbar stricture (Dr. Karsten Ro).  Prior clinic PVRs were normal.  Pelvic ultrasound is reassuring for no prostatic abscess.  Urine culture 12/27/2019 with no growth.   -Obtain PVR  2) Bilateral Nephrolithiasis Reported has required prior interventions. None with [facility]. No recent stone passage. No hydronephrosis.    -No intervention required.  -If develops new flank pain with signs of infection, please inform urology.   3) Left renal cyst Known since imagine in 12/2014: 2.2cm at that time.  2.2cm,  exophytic seen on CT. No intervention or follow up.    Thank you for this consult. I will notify Dr. Karsten Ro of admission.

## 2019-12-29 NOTE — Plan of Care (Signed)
  Problem: Clinical Measurements: Goal: Will remain free from infection Outcome: Progressing   Problem: Activity: Goal: Risk for activity intolerance will decrease Outcome: Progressing   Problem: Urinary Elimination: Goal: Signs and symptoms of infection will decrease Outcome: Progressing

## 2019-12-29 NOTE — Progress Notes (Signed)
PROGRESS NOTE    Marco [name]  [id number] DOB: 1956/08/05 DOA: 12/27/2019 PCP: [name], MD   Brief Narrative:  Marco [name] is a 64 y.o. male with medical history significant of combined congestive heart failure, nonischemic cardiomyopathy, CKD stage III, diabetes type 2, coronary artery disease, hypertension, obesity, OSA A. fib status post ablation in 2017, psoriatic arthritis on daily prednisone therapy who presents to the emergency department complaints of weakness, lethargic, falls at home.  He was recently admitted and was discharged earlier this month after being managed for  pyelonephritis.  Patient was noticing increasing weakness after discharge to home.  He also fell multiple times at home.  He also reported continuous urinary frequency, dysuria and incontinence Patient was admitted for the suspicion of recurrent urinary tract infection/prostatitis.  Urine culture did not show any growth.  Initial blood cultures have not shown any growth.  Patient is still has low-grade fever.  Resent blood cultures on 12/29/19.  Antibiotics broadened to vancomycin and Zosyn.  Urology consulted.  Assessment & Plan:   Principal Problem:   Sepsis ([place]) Active Problems:   Essential hypertension   Chronic kidney disease   Suspected sepsis: Presented with fever, lactic acidosis, lethargy.    Currently hemodynamically stable. He still has mild grade fever.Resent blood cultures on 12/29/19.  Antibiotics broadened to vancomycin and Zosyn.  Urology consulted.  Recurrent UTI/Suspected prostatitis from recent UTI/persistent dysuria: Presented with complaints of dysuria, frequency.  He was recently admitted here for the management of sepsis/pyelonephritis and was treated with IV antibiotics and was discharged.  CT abdomen at that time suggested right-sided pyelonephritis.  Urine culture + for  E. coli at that time. Possibility of prostatitis due to continued symptoms of dysuria.   Ultrasound  imaging of pelvis did not show any prostatitis or cystitis.   UA was not reassuring.He has mild leukocytosis.  Urine culture did not show any growth Started on ceftriaxone,broadened to vancomycin and cefepime because he still has mild grade fever.  Started on Pyridium for dysuria. CT abdomen/pelvis without contrast showed stable 3.9 cm x 2.9 cm exophytic isodense area along the posterior aspect of the upper pole of the right kidney with moderate to marked amount of surrounding inflammatory fat strandingy representing  a hemorrhagic cyst, with associated right-sided pyelonephritis. Urology has been consulted.  AKI on CKD stage IIIb: Baseline creatinine of 1.5.  Elevated creatinine from baseline on presentation but now close to baseline.IV fluids stopped.  Generalized weakness: From deconditioning, urinary tract infection.  Continue supportive care, physical therapy assessment done and recommended HHPT  Diabetes type 2: Takes insulin at home.  Continue current insulin regimen.  Monitor CBGs.  Chronic combined systolic/diastolic congestive heart failure: Last echo done on 10/2019 showed ejection fraction of 55 to 60%.  He is on Coreg, Entresto, Lasix at home .  Restarted Lasix and Coreg   hypertension: On Coreg, Imdur, hydralazine, Entresto, Lasix at home.    Hypertensive  this morning Home medications restarted.    Neuropathy: Takes Lyrica at home which has been held due to generalized weakness  Psoriatic arthritis: Takes prednisone 5 mg at home which we will continue.Also on monthly Cosyntex.  Normocytic anemia: Most likely associated with CKD.  Hemoglobin stable in the range of 10.  Morbid obesity: BMI of 39.2.  OSA: On cpap at home  Nutrition Problem: Increased nutrient needs Etiology: acute illness, wound healing      DVT prophylaxis: Heparin [place] Code Status: Full Family Communication: Called and discussed with  wife on 12/28/2019 Status is: Inpatient  Remains inpatient  appropriate because:Inpatient level of care appropriate due to severity of illness   Dispo: The patient is from: Home              Anticipated d/c is to: Home              Anticipated d/c date is: 2 days              Patient currently is not medically stable to d/c.   Consultants: None  Procedures:None  Antimicrobials:  Anti-infectives (From admission, onward)   Start     Dose/Rate Route Frequency Ordered Stop   12/27/19 0730  cefTRIAXone (ROCEPHIN) 1 g in sodium chloride 0.9 % 100 mL IVPB  Status:  Discontinued     1 g 200 mL/hr over 30 Minutes Intravenous Every 24 hours 12/27/19 0714 12/29/19 1239      Subjective: Patient seen and examined at the bedside this morning.  He still has mild fever today.  Upon my evaluation this morning, he denies any complaint and was alert and oriented but he still appears weak.  I was reported later this afternoon that patient became more confused.  He remains hemodynamically stable.  Objective: Vitals:   12/29/19 0120 12/29/19 0533 12/29/19 0636 12/29/19 1151  BP:  (!) 166/77 (!) 155/77 (!) 167/78  Pulse:  70 72 74  Resp:  20  16  Temp: 99.5 F (37.5 C) (!) 100.4 F (38 C) 99.5 F (37.5 C) (!) 100.5 F (38.1 C)  TempSrc: Oral Oral Oral Oral  SpO2:  95% 97% 93%  Weight:      Height:        Intake/Output Summary (Last 24 hours) at 12/29/2019 1239 Last data filed at 12/29/2019 0600 Gross per 24 hour  Intake 0 ml  Output 1725 ml  Net -1725 ml   Filed Weights   12/26/19 2321 12/27/19 1254  Weight: 110.2 kg 108.3 kg    Examination:  General exam: Not in distress, morbidly obese HEENT:PERRL,Oral mucosa moist, Ear/Nose normal on gross exam Respiratory system: few crackles on bases, no wheezes  cardiovascular system: S1 & S2 heard, RRR. No JVD, murmurs, rubs, gallops or clicks. No pedal edema. Gastrointestinal system: Abdomen is nondistended, soft and has suprapubic tenderness. No organomegaly or masses felt. Normal bowel sounds  heard. Central nervous system: Alert and oriented. No focal neurological deficits. Extremities: No edema, no clubbing ,no cyanosis Skin: No rashes, lesions or ulcers,no icterus ,no pallor  Data Reviewed: I have personally reviewed following labs and imaging studies  CBC: Recent Labs  Lab 12/26/19 2321 12/28/19 0440 12/29/19 0526  WBC 13.3* 12.2* 12.8*  NEUTROABS  --   --  8.4*  HGB 10.8* 10.5* 10.0*  HCT 35.3* 34.4* 31.1*  MCV 94.9 96.1 90.7  PLT 248 235 [id number]   Basic Metabolic Panel: Recent Labs  Lab 12/26/19 2321 12/28/19 0440 12/29/19 0526  NA 140 141 138  K 4.3 4.1 4.5  CL 98 103 104  CO2 29 28 25   GLUCOSE 182* 116* 91  BUN 21 14 12   CREATININE 1.83* 1.56* 1.48*  CALCIUM 8.2* 7.9* 8.0*   GFR: Estimated Creatinine Clearance: 58.2 mL/min (A) (by C-G formula based on SCr of 1.48 mg/dL (H)). Liver Function Tests: No results for input(s): AST, ALT, ALKPHOS, BILITOT, PROT, ALBUMIN in the last 168 hours. No results for input(s): LIPASE, AMYLASE in the last 168 hours. No results for input(s): AMMONIA in the last 168  hours. Coagulation Profile: No results for input(s): INR, PROTIME in the last 168 hours. Cardiac Enzymes: No results for input(s): CKTOTAL, CKMB, CKMBINDEX, TROPONINI in the last 168 hours. BNP (last 3 results) No results for input(s): PROBNP in the last 8760 hours. HbA1C: No results for input(s): HGBA1C in the last 72 hours. CBG: Recent Labs  Lab 12/26/19 2350 12/27/19 1013 12/29/19 1201  GLUCAP 159* 117* 109*   Lipid Profile: No results for input(s): CHOL, HDL, LDLCALC, TRIG, CHOLHDL, LDLDIRECT in the last 72 hours. Thyroid Function Tests: No results for input(s): TSH, T4TOTAL, FREET4, T3FREE, THYROIDAB in the last 72 hours. Anemia Panel: No results for input(s): VITAMINB12, FOLATE, FERRITIN, TIBC, IRON, RETICCTPCT in the last 72 hours. Sepsis Labs: Recent Labs  Lab 12/27/19 0730 12/27/19 1008  LATICACIDVEN 2.5* 1.1    Recent Results  (from the past 240 hour(s))  SARS Coronavirus 2 by RT PCR (hospital order, performed in [facility] hospital lab) Nasopharyngeal Nasopharyngeal Swab     Status: None   Collection Time: 12/27/19  4:44 AM   Specimen: Nasopharyngeal Swab  Result Value Ref Range Status   SARS Coronavirus 2 NEGATIVE NEGATIVE Final    Comment: (NOTE) SARS-CoV-2 target nucleic acids are NOT DETECTED. The SARS-CoV-2 RNA is generally detectable in upper and lower respiratory specimens during the acute phase of infection. The lowest concentration of SARS-CoV-2 viral copies this assay can detect is 250 copies / mL. A negative result does not preclude SARS-CoV-2 infection and should not be used as the sole basis for treatment or other patient management decisions.  A negative result may occur with improper specimen collection / handling, submission of specimen other than nasopharyngeal swab, presence of viral mutation(s) within the areas targeted by this assay, and inadequate number of viral copies (<250 copies / mL). A negative result must be combined with clinical observations, patient history, and epidemiological information. Fact Sheet for Patients:   StrictlyIdeas.no Fact Sheet for Healthcare Providers: BankingDealers.co.za This test is not yet approved or cleared  by the [place] FDA and has been authorized for detection and/or diagnosis of SARS-CoV-2 by FDA under an Emergency Use Authorization (EUA).  This EUA will remain in effect (meaning this test can be used) for the duration of the COVID-19 declaration under Section 564(b)(1) of the Act, 21 U.S.C. section 360bbb-3(b)(1), unless the authorization is terminated or revoked sooner. Performed at [facility], [place] [address] [place], [place] [address]   Urine culture     Status: None   Collection Time: 12/27/19  4:44 AM   Specimen: Urine, Clean Catch  Result Value Ref Range Status    Specimen Description   Final    URINE, CLEAN CATCH Performed at [facility], [place] [address], [place], [place] [address]    Special Requests   Final    NONE Performed at [facility], [place] [address] [place], [place] [address]    Culture   Final    NO GROWTH Performed at [facility], [place] [address], [place], [place] [address]    Report Status 12/28/2019 FINAL  Final  Blood culture (routine x 2)     Status: None (Preliminary result)   Collection Time: 12/27/19  7:01 AM   Specimen: BLOOD  Result Value Ref Range Status   Specimen Description   Final    BLOOD RIGHT ANTECUBITAL Performed at [facility] [address] [place],  [address]    Special Requests  Final    BOTTLES DRAWN AEROBIC AND ANAEROBIC Blood Culture adequate volume Performed at [facility] [address] [place], [place] [address]    Culture   Final    NO GROWTH 2 DAYS Performed at [facility] [address], [place], [place] [address]    Report Status PENDING  Incomplete  Blood culture (routine x 2)     Status: None (Preliminary result)   Collection Time: 12/27/19  7:27 AM   Specimen: BLOOD  Result Value Ref Range Status   Specimen Description   Final    BLOOD LEFT ANTECUBITAL Performed at [place] [address] [place], [place] [address]    Special Requests   Final    BOTTLES DRAWN AEROBIC AND ANAEROBIC Blood Culture adequate volume Performed at [facility] [address], [place], [place] [address]    Culture   Final    NO GROWTH 2 DAYS Performed at [facility] [address] [place], [place] [address]    Report Status PENDING  Incomplete         Radiology Studies: CT ABDOMEN PELVIS WO CONTRAST  Result Date: 12/28/2019 CLINICAL DATA:  Abdominal pain. EXAM: CT ABDOMEN AND PELVIS WITHOUT CONTRAST TECHNIQUE: Multidetector CT imaging of the  abdomen and pelvis was performed following the standard protocol without IV contrast. COMPARISON:  Dec 15, 2019 FINDINGS: Lower chest: Mild atelectasis is seen within the posterior aspect of the bilateral lung bases. There are small bilateral pleural effusions. Hepatobiliary: Stable 8 mm focus of parenchymal low attenuation is seen within the inferior medial aspect of the right lobe of the liver. No gallstones, gallbladder wall thickening, or biliary dilatation. Pancreas: Unremarkable. No pancreatic ductal dilatation or surrounding inflammatory changes. Spleen: Normal in size without focal abnormality. Adrenals/Urinary Tract: Adrenal glands are unremarkable. The kidneys are normal in size. A stable 3.9 cm x 2.9 cm exophytic isodense area is seen along the posterior aspect of the upper pole of the right kidney. A moderate to marked amount of surrounding inflammatory fat stranding is seen which is increased in severity when compared to the prior study. A stable 2.2 cm exophytic cyst is seen along the anterior aspect of the mid left kidney. Bilateral 2 mm and 3 mm nonobstructing renal stones are seen. Bladder is unremarkable. Stomach/Bowel: Stomach is within normal limits. The appendix is not identified. Surgically anastomosed bowel is seen along the proximal portion of the transverse colon. No evidence of bowel dilatation. Noninflamed diverticula are seen within the descending and sigmoid colon. Vascular/Lymphatic: Moderate severity aortic calcification. No enlarged abdominal or pelvic lymph nodes. Reproductive: Prostate is unremarkable. Other: There is a 4.6 cm x 4.1 cm fat containing right inguinal hernia. A 3.6 cm x 2.7 cm fat containing left inguinal hernia is also seen. Musculoskeletal: No acute or significant osseous findings. IMPRESSION: 1. Small bilateral pleural effusions with mild bibasilar atelectasis. 2. Stable 3.9 cm x 2.9 cm exophytic isodense area along the posterior aspect of the upper pole of the right  kidney with moderate to marked amount of surrounding inflammatory fat stranding. This likely represents a hemorrhagic cyst, with associated right-sided pyelonephritis. 3. Stable left renal cyst. 4. Bilateral nonobstructing renal stones. 5. Colonic diverticulosis. 6. Bilateral fat-containing inguinal hernias. Aortic Atherosclerosis (ICD10-I70.0). Electronically Signed   By: Virgina Norfolk M.D.   On: 12/28/2019 16:50        Scheduled Meds: . allopurinol  200 mg Oral QPM  . aspirin EC  81 mg Oral QHS  . atorvastatin  40 mg Oral Daily  . carvedilol  12.5 mg Oral BID WC  . famotidine  20 mg Oral BID  . feeding supplement (ENSURE ENLIVE)  237 mL Oral BID BM  . feeding supplement (PRO-STAT SUGAR FREE 64)  30 mL Oral BID  . fluticasone  2 spray Each Nare Daily  . furosemide  40 mg Oral Daily  . heparin  5,000 Units Subcutaneous Q8H  . multivitamin with minerals  1 tablet Oral Daily  . nutrition supplement (JUVEN)  1 packet Oral BID BM  . phenazopyridine  100 mg Oral TID WC  . potassium chloride SA  20 mEq Oral Daily  . predniSONE  5 mg Oral Daily   Continuous Infusions: . sodium chloride 250 mL (12/29/19 0658)     LOS: 2 days    Time spent: 25 mins.More than 50% of that time was spent in counseling and/or coordination of care.      Shelly Coss, MD [facility] P5/20/2021, 12:39 PM

## 2019-12-29 NOTE — Progress Notes (Signed)
Pt with new confusion, slow to follow commands. Must redirect multiple times. Pt also incontinent today and getting out of bed/chair without calling. VS checked, as well as CBG. MD Adhikari made aware. Awaiting new orders. Will continue to monitor patient.

## 2019-12-29 NOTE — Progress Notes (Signed)
Pt refused CPAP qhs.  Pt encouraged to contact RT should he change his mind.   

## 2019-12-30 ENCOUNTER — Inpatient Hospital Stay (HOSPITAL_COMMUNITY): Payer: Medicaid Other

## 2019-12-30 DIAGNOSIS — N200 Calculus of kidney: Secondary | ICD-10-CM

## 2019-12-30 DIAGNOSIS — N35919 Unspecified urethral stricture, male, unspecified site: Secondary | ICD-10-CM | POA: Diagnosis present

## 2019-12-30 DIAGNOSIS — R509 Fever, unspecified: Secondary | ICD-10-CM

## 2019-12-30 LAB — CBC WITH DIFFERENTIAL/PLATELET
Abs Immature Granulocytes: 0.14 10*3/uL — ABNORMAL HIGH (ref 0.00–0.07)
Basophils Absolute: 0.1 10*3/uL (ref 0.0–0.1)
Basophils Relative: 0 %
Eosinophils Absolute: 0.3 10*3/uL (ref 0.0–0.5)
Eosinophils Relative: 2 %
HCT: 30.1 % — ABNORMAL LOW (ref 39.0–52.0)
Hemoglobin: 9.7 g/dL — ABNORMAL LOW (ref 13.0–17.0)
Immature Granulocytes: 1 %
Lymphocytes Relative: 8 %
Lymphs Abs: 1.3 10*3/uL (ref 0.7–4.0)
MCH: 29.1 pg (ref 26.0–34.0)
MCHC: 32.2 g/dL (ref 30.0–36.0)
MCV: 90.4 fL (ref 80.0–100.0)
Monocytes Absolute: 2.2 10*3/uL — ABNORMAL HIGH (ref 0.1–1.0)
Monocytes Relative: 13 %
Neutro Abs: 12.7 10*3/uL — ABNORMAL HIGH (ref 1.7–7.7)
Neutrophils Relative %: 76 %
Platelets: 267 10*3/uL (ref 150–400)
RBC: 3.33 MIL/uL — ABNORMAL LOW (ref 4.22–5.81)
RDW: 15.9 % — ABNORMAL HIGH (ref 11.5–15.5)
WBC: 16.8 10*3/uL — ABNORMAL HIGH (ref 4.0–10.5)
nRBC: 0.2 % (ref 0.0–0.2)

## 2019-12-30 LAB — MRSA PCR SCREENING: MRSA by PCR: NEGATIVE

## 2019-12-30 LAB — BASIC METABOLIC PANEL
Anion gap: 11 (ref 5–15)
BUN: 17 mg/dL (ref 8–23)
CO2: 24 mmol/L (ref 22–32)
Calcium: 8.2 mg/dL — ABNORMAL LOW (ref 8.9–10.3)
Chloride: 105 mmol/L (ref 98–111)
Creatinine, Ser: 1.51 mg/dL — ABNORMAL HIGH (ref 0.61–1.24)
GFR calc Af Amer: 56 mL/min — ABNORMAL LOW (ref >60)
GFR calc non Af Amer: 48 mL/min — ABNORMAL LOW (ref >60)
Glucose, Bld: 113 mg/dL — ABNORMAL HIGH (ref 70–99)
Potassium: 4.2 mmol/L (ref 3.5–5.1)
Sodium: 140 mmol/L (ref 135–145)

## 2019-12-30 LAB — CRYPTOCOCCAL ANTIGEN: Crypto Ag: NEGATIVE

## 2019-12-30 LAB — PROTIME-INR
INR: 1.2 (ref 0.8–1.2)
Prothrombin Time: 15 seconds (ref 11.4–15.2)

## 2019-12-30 MED ORDER — SODIUM CHLORIDE 0.9% FLUSH: 10.0000 mL | INTRAVENOUS | Status: DC | PRN

## 2019-12-30 MED ORDER — CHLORHEXIDINE GLUCONATE CLOTH 2 % EX PADS
6.0000 | MEDICATED_PAD | Freq: Every day | CUTANEOUS | Status: DC
  Administered 2019-12-30 – 2020-01-14 (×15): 6 via TOPICAL

## 2019-12-30 MED ORDER — ORAL CARE MOUTH RINSE
15.0000 mL | Freq: Two times a day (BID) | OROMUCOSAL | Status: DC
  Administered 2019-12-30 – 2020-01-01 (×3): 15 mL via OROMUCOSAL

## 2019-12-30 MED ORDER — LORAZEPAM 2 MG/ML IJ SOLN
0.5000 mg | INTRAMUSCULAR | Status: DC | PRN
  Administered 2019-12-30 – 2019-12-31 (×3): 0.5 mg via INTRAVENOUS
  Filled 2019-12-30 (×4): qty 1

## 2019-12-30 MED ORDER — SODIUM CHLORIDE 0.9 % IV BOLUS: 500.0000 mL | Freq: Once | INTRAVENOUS | Status: DC

## 2019-12-30 MED ORDER — HYDRALAZINE HCL 20 MG/ML IJ SOLN
10.0000 mg | INTRAMUSCULAR | Status: DC | PRN
  Administered 2019-12-30 – 2020-01-05 (×12): 10 mg via INTRAVENOUS
  Filled 2019-12-30 (×10): qty 1

## 2019-12-30 MED ORDER — SODIUM CHLORIDE 0.9% FLUSH
10.0000 mL | Freq: Two times a day (BID) | INTRAVENOUS | Status: DC
  Administered 2019-12-31 – 2020-01-02 (×4): 10 mL
  Administered 2020-01-02: 40 mL
  Administered 2020-01-03: 10 mL
  Administered 2020-01-03: 40 mL
  Administered 2020-01-04 – 2020-01-05 (×3): 10 mL
  Administered 2020-01-05: 20 mL

## 2019-12-30 MED ORDER — ACETAMINOPHEN 650 MG RE SUPP
325.0000 mg | RECTAL | Status: DC | PRN
  Administered 2019-12-30 – 2020-01-02 (×6): 325 mg via RECTAL
  Filled 2019-12-30 (×7): qty 1

## 2019-12-30 MED ORDER — ACETAMINOPHEN 10 MG/ML IV SOLN
1000.0000 mg | Freq: Once | INTRAVENOUS | Status: AC
  Administered 2019-12-30: 1000 mg via INTRAVENOUS
  Filled 2019-12-30: qty 100

## 2019-12-30 MED ORDER — SODIUM CHLORIDE 0.9 % IV SOLN: INTRAVENOUS | Status: DC

## 2019-12-30 MED ORDER — SODIUM CHLORIDE 0.9 % IV BOLUS
1000.0000 mL | Freq: Once | INTRAVENOUS | Status: AC
  Administered 2019-12-30: 1000 mL via INTRAVENOUS

## 2019-12-30 NOTE — Telephone Encounter (Signed)
Called and spoke with spouse.  She is aware of appt being rescheduled to 02/23/20.

## 2019-12-30 NOTE — Progress Notes (Signed)
Patient continues to be very restless and confused. Patient is in [place]. Fever noted 101.3 F. Charge Nurse aware. MD notified. Administering PRN Tylenol as ordered and transferring to SD. Waiting to take patient to MRI. Will call report to [place].

## 2019-12-30 NOTE — Progress Notes (Signed)
Continues to decline nocturnal CPAP. RT will continue to follow.  

## 2019-12-30 NOTE — Progress Notes (Signed)
Report called to [facility] in [place]. Transferring patient now.

## 2019-12-30 NOTE — Consult Note (Signed)
[facility]    Date of Admission:  12/27/2019   Total days of antibiotics 4        Day 2 vancomycin        Day 2 cefepime              Reason for Consult: Unexplained fever    Referring Provider: Dr. Shelly Coss  Assessment: His fever and confusion remains unclear.  There is evidence that he has recurrent UTI, other intra-abdominal infection, diarrhea, pneumonia, IV associated bacteremia or drug fever.  I am concerned about the possibility of meningitis.  I recommend lumbar puncture and will check a serum cryptococcal antigen now.  Plan: 1. Continue current antibiotics 2. Await results of repeat blood cultures 3. Lumbar puncture 4. Serum cryptococcal antigen   Principal Problem:   Fever Active Problems:   CAD (coronary artery disease)   Atrial fibrillation (HCC)   Essential hypertension   Chronic kidney disease   Morbid obesity due to excess calories (HCC)   Chronic pain   Type 2 diabetes mellitus with stage 3 chronic kidney disease, without long-term current use of insulin (HCC)   OSA (obstructive sleep apnea)   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HCC)   Diabetic polyneuropathy associated with type 2 diabetes mellitus (HCC)   Chronic combined systolic and diastolic CHF (congestive heart failure) (HCC)   Hyperlipidemia   Urethral stricture   Nephrolithiasis   Scheduled Meds: . allopurinol  200 mg Oral QPM  . aspirin EC  81 mg Oral QHS  . atorvastatin  40 mg Oral Daily  . carvedilol  12.5 mg Oral BID WC  . famotidine  20 mg Oral BID  . feeding supplement (ENSURE ENLIVE)  237 mL Oral BID BM  . feeding supplement (PRO-STAT SUGAR FREE 64)  30 mL Oral BID  . fluticasone  2 spray Each Nare Daily  . furosemide  40 mg Oral Daily  . heparin  5,000 Units Subcutaneous Q8H  . multivitamin with minerals  1 tablet Oral Daily  . nutrition supplement (JUVEN)  1 packet Oral BID BM  . phenazopyridine  100 mg Oral TID WC  . potassium chloride SA  20 mEq  Oral Daily  . predniSONE  5 mg Oral Daily  . sacubitril-valsartan  1 tablet Oral BID   Continuous Infusions: . sodium chloride 250 mL (12/29/19 0658)  . ceFEPime (MAXIPIME) IV 2 g (12/30/19 0540)  . vancomycin 750 mg (12/30/19 0410)   PRN Meds:.sodium chloride, acetaminophen, albuterol, oxyCODONE, polyethylene glycol  HPI: Marco [name] is a 64 y.o. male who was admitted to the hospital on 12/08/2019 with dysuria.  He had pyuria and was diagnosed with right pyelonephritis.  Urine culture grew E. coli.  Blood cultures were negative.  He improved on IV antibiotics and was discharged on 12/13/2019 with a plan for 6 more days of oral cefdinir.  He was seen back in the ED 2 days later with fever, cough, shortness of breath, dysuria and abdominal pain.  Urine and blood cultures were negative.  Testing for COVID-19 and influenza was negative.  CT showed stable right perinephric stranding.  He was given 1 dose of vancomycin and cefepime and discharged home.  He returned on 517 with low-grade fever of 100.8 degrees, weakness, dysuria, frequency, urinary incontinence and recent falls.  Urinalysis showed only 0-5 white blood cells.  Urine and blood cultures were negative.  Ceftriaxone but continued to have low-grade fevers initially temperature to 103.6  degrees yesterday.  Repeat blood cultures were ordered.  Chest x-ray shows only mild atelectasis.  CT scan of the abdomen and pelvis shows the same right perinephric stranding.  He has been very lethargic throughout all of his recent hospitalization.  Apparently at baseline he is alert and fairly independent.   Review of Systems: Review of Systems  Unable to perform ROS: Mental acuity    Past Medical History:  Diagnosis Date  . Benign colon polyp 08/01/2013   [facility] in [place]. large base tranverse colon polyp was biopsied. polyp was benign with minimal surface hyperplastic change.  . Chronic combined systolic and diastolic CHF (congestive  heart failure) ([place])   . Chronic pain   . CKD (chronic kidney disease), stage III   . Diabetes mellitus without complication ([place])   . Diverticulosis   . Esophageal hiatal hernia 07/29/2013   confirmed on EGD   . Esophageal stricture   . Essential hypertension   . Gastritis 07/29/2013   confirmed on EGD, bx done an negative for intestinal metaplasia, dsyplasia or H. pylori. normal gastric emptying study done 07/13/2013.  [name] GERD (gastroesophageal reflux disease)   . Morbid obesity ([place])   . Non-obstructive CAD    a. 02/2013 Cath ([place]): nonobs dzs;  b. 09/2014 Myoview ([place]): EF 55%, no ischemia;  c. Cath 05/2018 mild CAD no obstruction.  . Persistent atrial fibrillation ([place])    a. 02/2013 s/p rfca in [place], [place]-->prev on Xarelto, d/c'd 2/2 anemia, ? GIB.  [name] Rheumatoid arthritis ([place])   . Sigmoid diverticulosis 08/01/2013   confirmed on colonscopy. record scanned into chart    Social History   Tobacco Use  . Smoking status: Former Smoker    Packs/day: 0.50    Years: 10.00    Pack years: 5.00    Types: Cigarettes    Quit date: 04/11/2008    Years since quitting: 11.7  . Smokeless tobacco: Never Used  Substance Use Topics  . Alcohol use: No    Alcohol/week: 0.0 standard drinks  . Drug use: No    Family History  Problem Relation Age of Onset  . Hypertension Mother   . Diabetes Mother   . COPD Mother   . Lung cancer Mother        Smoker   . Hypertension Father   . Diabetes Father   . Heart Problems Father   . COPD Father   . Colon cancer Neg Hx   . Stomach cancer Neg Hx    Allergies  Allergen Reactions  . Nsaids Other (See Comments)    Stomach pains. Ulcers - stated by patient     OBJECTIVE: Blood pressure (!) 160/85, pulse 81, temperature (!) 100.9 F (38.3 C), temperature source Oral, resp. rate 20, height 5\' 6"  (1.676 m), weight 108.3 kg, SpO2 92 %.  Physical Exam Constitutional:      Comments: He is resting in bed with his eyes closed.  He will open his  eyes but does not respond to questions.  Neck:     Comments: He is uncomfortable with any attempt to move his neck in any direction. Cardiovascular:     Rate and Rhythm: Normal rate and regular rhythm.     Heart sounds: No murmur.  Pulmonary:     Effort: Pulmonary effort is normal.     Breath sounds: Normal breath sounds.  Abdominal:     General: There is distension.     Palpations: Abdomen is soft.  Comments: He moans when his abdomen is palpated.  Musculoskeletal:        General: No swelling or tenderness.  Skin:    Findings: No rash.     Comments: Right arm IV site appears normal.     Lab Results Lab Results  Component Value Date   WBC 16.8 (H) 12/30/2019   HGB 9.7 (L) 12/30/2019   HCT 30.1 (L) 12/30/2019   MCV 90.4 12/30/2019   PLT 267 12/30/2019    Lab Results  Component Value Date   CREATININE 1.48 (H) 12/29/2019   BUN 12 12/29/2019   NA 138 12/29/2019   K 4.5 12/29/2019   CL 104 12/29/2019   CO2 25 12/29/2019    Lab Results  Component Value Date   ALT 47 (H) 12/15/2019   AST 27 12/15/2019   ALKPHOS 65 12/15/2019   BILITOT 1.0 12/15/2019     Microbiology: Recent Results (from the past 240 hour(s))  SARS Coronavirus 2 by RT PCR (hospital order, performed in [facility] hospital lab) Nasopharyngeal Nasopharyngeal Swab     Status: None   Collection Time: 12/27/19  4:44 AM   Specimen: Nasopharyngeal Swab  Result Value Ref Range Status   SARS Coronavirus 2 NEGATIVE NEGATIVE Final    Comment: (NOTE) SARS-CoV-2 target nucleic acids are NOT DETECTED. The SARS-CoV-2 RNA is generally detectable in upper and lower respiratory specimens during the acute phase of infection. The lowest concentration of SARS-CoV-2 viral copies this assay can detect is 250 copies / mL. A negative result does not preclude SARS-CoV-2 infection and should not be used as the sole basis for treatment or other patient management decisions.  A negative result may occur with improper  specimen collection / handling, submission of specimen other than nasopharyngeal swab, presence of viral mutation(s) within the areas targeted by this assay, and inadequate number of viral copies (<250 copies / mL). A negative result must be combined with clinical observations, patient history, and epidemiological information. Fact Sheet for Patients:   StrictlyIdeas.no Fact Sheet for Healthcare Providers: BankingDealers.co.za This test is not yet approved or cleared  by the [place] FDA and has been authorized for detection and/or diagnosis of SARS-CoV-2 by FDA under an Emergency Use Authorization (EUA).  This EUA will remain in effect (meaning this test can be used) for the duration of the COVID-19 declaration under Section 564(b)(1) of the Act, 21 U.S.C. section 360bbb-3(b)(1), unless the authorization is terminated or revoked sooner. Performed at [facility], [place] [address], [place], [place] [address]   Urine culture     Status: None   Collection Time: 12/27/19  4:44 AM   Specimen: Urine, Clean Catch  Result Value Ref Range Status   Specimen Description   Final    URINE, CLEAN CATCH Performed at [facility], [place] [address], [place], [place] [address]    Special Requests   Final    NONE Performed at [facility] [place], [place] [address] [place], [place] [address]    Culture   Final    NO GROWTH Performed at [facility], [place] [address], [place], [place] [address]    Report Status 12/28/2019 FINAL  Final  Blood culture (routine x 2)     Status: None (Preliminary result)   Collection Time: 12/27/19  7:01 AM   Specimen: BLOOD  Result Value Ref Range Status   Specimen Description   Final    BLOOD RIGHT ANTECUBITAL Performed at [facility], [place] [address], [place], [place] [address]    Special Requests   Final    BOTTLES DRAWN AEROBIC AND ANAEROBIC  Blood Culture adequate volume Performed at [facility] [address], [place], [place] [address]    Culture   Final    NO GROWTH 3 DAYS Performed at [facility], [place] [address] [place], [place] [address]    Report Status PENDING  Incomplete  Blood culture (routine x 2)     Status: None (Preliminary result)   Collection Time: 12/27/19  7:27 AM   Specimen: BLOOD  Result Value Ref Range Status   Specimen Description   Final    BLOOD LEFT ANTECUBITAL Performed at [place] [address] [place], [place] [address]    Special Requests   Final    BOTTLES DRAWN AEROBIC AND ANAEROBIC Blood Culture adequate volume Performed at [facility] [address], [place], [place] [address]    Culture   Final    NO GROWTH 3 DAYS Performed at [facility], [place] [address] [place], [place] [address]    Report Status PENDING  Incomplete  Culture, blood (routine x 2)     Status: None (Preliminary result)   Collection Time: 12/29/19 10:06 AM   Specimen: BLOOD  Result Value Ref Range Status   Specimen Description   Final    BLOOD RIGHT ARM Performed at [place] [address], [place], [place] [address]    Special Requests   Final    BOTTLES DRAWN AEROBIC AND ANAEROBIC Blood Culture adequate volume Performed at [place] [address], [place], [place] [address]    Culture   Final    NO GROWTH < 24 HOURS Performed at [facility] [address], [place], [place] [address]    Report Status PENDING  Incomplete  Culture, blood (routine x 2)     Status: None (Preliminary result)   Collection Time: 12/29/19 10:07 AM   Specimen: BLOOD  Result Value Ref Range Status   Specimen Description   Final    BLOOD RIGHT ARM Performed at [facility] [address], [place], [place] [address]    Special Requests   Final    BOTTLES DRAWN AEROBIC AND  ANAEROBIC Blood Culture adequate volume Performed at [facility] [place] [address], [place], [place] [address]    Culture   Final    NO GROWTH < 24 HOURS Performed at [facility] [place] [address], [place], [place] [address]    Report Status PENDING  Incomplete    Michel Bickers, MD [facility] for [facility] [phone] pager   [phone] cell 12/30/2019, 10:01 AM

## 2019-12-30 NOTE — Progress Notes (Addendum)
PROGRESS NOTE    Marco [name]  [id number] DOB: February 25, 1956 DOA: 12/27/2019 PCP: Marco Pier, MD   Brief Narrative:  Marco [name] is a 64 y.o. male with medical history significant of combined congestive heart failure, nonischemic cardiomyopathy, CKD stage III, diabetes type 2, coronary artery disease, hypertension, obesity, OSA A. fib status post ablation in 2017, psoriatic arthritis on daily prednisone therapy who presents to the emergency department complaints of weakness, lethargic, falls at home.  He was recently admitted and was discharged earlier this month after being managed for  pyelonephritis.  Patient was noticing increasing weakness after discharge to home.  He also fell multiple times at home.  He also reported continuous urinary frequency, dysuria and incontinence Patient was admitted for the suspicion of recurrent urinary tract infection/prostatitis.  Urine culture did not show any growth.  Initial blood cultures have not shown any growth.  Repeat blood cultures have been sent.  Patient has persistent fever and today he has worsening leukocytosis.   Antibiotics have been  broadened to vancomycin and Zosyn.  Urology consulted and following.  We also consulted ID today.  Assessment & Plan:   Principal Problem:   Sepsis ([place]) Active Problems:   Essential hypertension   Chronic kidney disease   Suspected sepsis: Presented with fever, lactic acidosis, lethargy.    Currently hemodynamically stable. He still has mild grade fever.Resent blood cultures on 12/29/19.  Antibiotics broadened to vancomycin and Zosyn.  Urology consulted.  ID also consulted.  Started on Marco IV fluids today.  Recurrent UTI/Suspected prostatitis from recent UTI/persistent dysuria: Presented with complaints of dysuria, frequency.  He was recently admitted here for the management of sepsis/pyelonephritis and was treated with IV antibiotics and was discharged.  CT abdomen at that time suggested  right-sided pyelonephritis.  Urine culture + for  E. coli at that time. Possibility of prostatitis due to continued symptoms of dysuria.   Ultrasound imaging of pelvis did not show any prostatitis or cystitis.   UA was not reassuring.Urine culture did not show any growth Started on ceftriaxone,broadened to vancomycin and cefepime because he still has fever.  Started on Pyridium for dysuria. CT abdomen/pelvis without contrast showed stable 3.9 cm x 2.9 cm exophytic isodense area along the posterior aspect of the upper pole of the right kidney with moderate to marked amount of surrounding inflammatory fat strandingy representing  a hemorrhagic cyst, with associated right-sided pyelonephritis. Urology has been consulted and following.  Urology recommended to repeat CT abdomen/pelvis with contrast to follow-up on right-sided hemorrhagic cyst if he continues to be febrile.  Altered mental status: New problem.  He is more confused and lethargic today.  Also had fever of 103 F yesterday.  ID is following.  Meningitis needs to be rule out.  LP has been ordered.  We will also get MRI of his brain.  AKI on CKD stage IIIb: Baseline creatinine of 1.5.  Elevated creatinine from baseline on presentation but now close to baseline.IV fluids stopped.  Generalized weakness: From deconditioning, urinary tract infection.  Continue supportive care, physical therapy assessment done and recommended HHPT  Diabetes type 2: Takes insulin at home.  Continue current insulin regimen.  Monitor CBGs.  Chronic combined systolic/diastolic congestive heart failure: Last echo done on 10/2019 showed ejection fraction of 55 to 60%.  He is on Coreg, Entresto, Lasix at home .    hypertension: On Coreg, Imdur, hydralazine, Entresto, Lasix at home.    Hypertensive  this morning Home medications restarted.  Neuropathy: Takes Lyrica at home which has been held due to generalized weakness  Psoriatic arthritis: Takes prednisone 5 mg  at home which we will continue.Also on monthly Cosyntex.  Normocytic anemia: Most likely associated with CKD.  Hemoglobin stable in the range of 10.  Morbid obesity: BMI of 39.2.  OSA: On cpap at home  Nutrition Problem: Increased nutrient needs Etiology: acute illness, wound healing      DVT prophylaxis: Heparin [place] Code Status: Full Family Communication: Called and discussed with wife on 12/28/2019.Called again today,call not received.Will attempt again later today  Status is: Inpatient  Remains inpatient appropriate because:Inpatient level of care appropriate due to severity of illness   Dispo: The patient is from: Home              Anticipated d/c is to: Home              Anticipated d/c date is: 2 days              Patient currently is not medically stable to d/c.   Consultants: None  Procedures:None  Antimicrobials:  Anti-infectives (From admission, onward)   Start     Dose/Rate Route Frequency Ordered Stop   12/30/19 0400  vancomycin (VANCOREADY) IVPB 750 mg/150 mL     750 mg 150 mL/hr over 60 Minutes Intravenous Every 12 hours 12/29/19 1340     12/29/19 1430  ceFEPIme (MAXIPIME) 2 g in sodium chloride 0.9 % 100 mL IVPB     2 g 200 mL/hr over 30 Minutes Intravenous Every 8 hours 12/29/19 1340     12/29/19 1400  vancomycin (VANCOREADY) IVPB 2000 mg/400 mL     2,000 mg 200 mL/hr over 120 Minutes Intravenous  Once 12/29/19 1340 12/29/19 1838   12/27/19 0730  cefTRIAXone (ROCEPHIN) 1 g in sodium chloride 0.9 % 100 mL IVPB  Status:  Discontinued     1 g 200 mL/hr over 30 Minutes Intravenous Every 24 hours 12/27/19 0714 12/29/19 1239      Subjective: Patient seen and examined at the bedside this morning.  Hemodynamically stable but looks very confused and lethargic today.  Mental status has definitely gotten worse today.  Output of orange-pinkish urine in the Foley bag.  Objective: Vitals:   12/29/19 1151 12/29/19 2049 12/30/19 0042 12/30/19 0457  BP: (!)  167/78 (!) 150/79 (!) 155/83 (!) 149/70  Pulse: 74 75 97 67  Resp: 16 18 18 18   Temp: (!) 100.5 F (38.1 C) 99.2 F (37.3 C) (!) 103.6 F (39.8 C) 99.7 F (37.6 C)  TempSrc: Oral Oral Rectal Oral  SpO2: 93% 93% 90% 94%  Weight:      Height:        Intake/Output Summary (Last 24 hours) at 12/30/2019 0748 Last data filed at 12/30/2019 0200 Gross per 24 hour  Intake 664.04 ml  Output 900 ml  Net -235.96 ml   Filed Weights   12/26/19 2321 12/27/19 1254  Weight: 110.2 kg 108.3 kg    Examination:   General exam: Confused, lethargic Respiratory system: Bilateral equal air entry, normal vesicular breath sounds, no wheezes or crackles  Cardiovascular system: S1 & S2 heard, RRR. No JVD, murmurs, rubs, gallops or clicks. Gastrointestinal system: Abdomen is nondistended, soft and has generalized tenderness. No organomegaly or masses felt. Normal bowel sounds heard. Central nervous system: Not alert and oriented. No focal neurological deficits. Extremities: No edema, no clubbing ,no cyanosis Skin: No rashes, lesions or ulcers,no icterus ,no pallor  Data Reviewed: I have personally reviewed following labs and imaging studies  CBC: Recent Labs  Lab 12/26/19 2321 12/28/19 0440 12/29/19 0526 12/30/19 0512  WBC 13.3* 12.2* 12.8* 16.8*  NEUTROABS  --   --  8.4* 12.7*  HGB 10.8* 10.5* 10.0* 9.7*  HCT 35.3* 34.4* 31.1* 30.1*  MCV 94.9 96.1 90.7 90.4  PLT 248 235 256 [id number]   Basic Metabolic Panel: Recent Labs  Lab 12/26/19 2321 12/28/19 0440 12/29/19 0526  NA 140 141 138  K 4.3 4.1 4.5  CL 98 103 104  CO2 29 28 25   GLUCOSE 182* 116* 91  BUN 21 14 12   CREATININE 1.83* 1.56* 1.48*  CALCIUM 8.2* 7.9* 8.0*   GFR: Estimated Creatinine Clearance: 58.2 mL/min (A) (by C-G formula based on SCr of 1.48 mg/dL (H)). Liver Function Tests: No results for input(s): AST, ALT, ALKPHOS, BILITOT, PROT, ALBUMIN in the last 168 hours. No results for input(s): LIPASE, AMYLASE in the last  168 hours. No results for input(s): AMMONIA in the last 168 hours. Coagulation Profile: No results for input(s): INR, PROTIME in the last 168 hours. Cardiac Enzymes: No results for input(s): CKTOTAL, CKMB, CKMBINDEX, TROPONINI in the last 168 hours. BNP (last 3 results) No results for input(s): PROBNP in the last 8760 hours. HbA1C: No results for input(s): HGBA1C in the last 72 hours. CBG: Recent Labs  Lab 12/26/19 2350 12/27/19 1013 12/29/19 1201  GLUCAP 159* 117* 109*   Lipid Profile: No results for input(s): CHOL, HDL, LDLCALC, TRIG, CHOLHDL, LDLDIRECT in the last 72 hours. Thyroid Function Tests: No results for input(s): TSH, T4TOTAL, FREET4, T3FREE, THYROIDAB in the last 72 hours. Anemia Panel: No results for input(s): VITAMINB12, FOLATE, FERRITIN, TIBC, IRON, RETICCTPCT in the last 72 hours. Sepsis Labs: Recent Labs  Lab 12/27/19 0730 12/27/19 1008  LATICACIDVEN 2.5* 1.1    Recent Results (from the past 240 hour(s))  SARS Coronavirus 2 by RT PCR (hospital order, performed in [facility] hospital lab) Nasopharyngeal Nasopharyngeal Swab     Status: None   Collection Time: 12/27/19  4:44 AM   Specimen: Nasopharyngeal Swab  Result Value Ref Range Status   SARS Coronavirus 2 NEGATIVE NEGATIVE Final    Comment: (NOTE) SARS-CoV-2 target nucleic acids are NOT DETECTED. The SARS-CoV-2 RNA is generally detectable in upper and lower respiratory specimens during the acute phase of infection. The lowest concentration of SARS-CoV-2 viral copies this assay can detect is 250 copies / mL. A negative result does not preclude SARS-CoV-2 infection and should not be used as the sole basis for treatment or other patient management decisions.  A negative result may occur with improper specimen collection / handling, submission of specimen other than nasopharyngeal swab, presence of viral mutation(s) within the areas targeted by this assay, and inadequate number of viral copies (<250  copies / mL). A negative result must be combined with clinical observations, patient history, and epidemiological information. Fact Sheet for Patients:   StrictlyIdeas.no Fact Sheet for Healthcare Providers: BankingDealers.co.za This test is not yet approved or cleared  by the [place] FDA and has been authorized for detection and/or diagnosis of SARS-CoV-2 by FDA under an Emergency Use Authorization (EUA).  This EUA will remain in effect (meaning this test can be used) for the duration of the COVID-19 declaration under Section 564(b)(1) of the Act, 21 U.S.C. section 360bbb-3(b)(1), unless the authorization is terminated or revoked sooner. Performed at [facility], [place] [address] [place], [place] [address]   Urine  culture     Status: None   Collection Time: 12/27/19  4:44 AM   Specimen: Urine, Clean Catch  Result Value Ref Range Status   Specimen Description   Final    URINE, CLEAN CATCH Performed at [facility], [place] [address] [place], [place] [address]    Special Requests   Final    NONE Performed at [facility], [place] [address], [place], [place] [address]    Culture   Final    NO GROWTH Performed at [facility], [place] [address], [place], [place] [address]    Report Status 12/28/2019 FINAL  Final  Blood culture (routine x 2)     Status: None (Preliminary result)   Collection Time: 12/27/19  7:01 AM   Specimen: BLOOD  Result Value Ref Range Status   Specimen Description   Final    BLOOD RIGHT ANTECUBITAL Performed at [place] [address], [place], [place] [address]    Special Requests   Final    BOTTLES DRAWN AEROBIC AND ANAEROBIC Blood Culture adequate volume Performed at [place] [address], [place], [place] [address]    Culture   Final    NO GROWTH 3 DAYS Performed at [facility],  [place] [address] [place], [place] [address]    Report Status PENDING  Incomplete  Blood culture (routine x 2)     Status: None (Preliminary result)   Collection Time: 12/27/19  7:27 AM   Specimen: BLOOD  Result Value Ref Range Status   Specimen Description   Final    BLOOD LEFT ANTECUBITAL Performed at [place] [address] [place], [place] [address]    Special Requests   Final    BOTTLES DRAWN AEROBIC AND ANAEROBIC Blood Culture adequate volume Performed at [place] [address] [place], [place] [address]    Culture   Final    NO GROWTH 3 DAYS Performed at [facility], [place] [address] [place], [place] [address]    Report Status PENDING  Incomplete  Culture, blood (routine x 2)     Status: None (Preliminary result)   Collection Time: 12/29/19 10:06 AM   Specimen: BLOOD  Result Value Ref Range Status   Specimen Description   Final    BLOOD RIGHT ARM Performed at [place] [address] [place] [address], [place], [place] [address]    Special Requests   Final    BOTTLES DRAWN AEROBIC AND ANAEROBIC Blood Culture adequate volume Performed at [place] [address], [place], [place] [address]    Culture   Final    NO GROWTH < 24 HOURS Performed at [place] [address] [place], [place] [address]    Report Status PENDING  Incomplete  Culture, blood (routine x 2)     Status: None (Preliminary result)   Collection Time: 12/29/19 10:07 AM   Specimen: BLOOD  Result Value Ref Range Status   Specimen Description   Final    BLOOD RIGHT ARM Performed at [place] [address], [place], [place] [address]    Special Requests   Final    BOTTLES DRAWN AEROBIC AND ANAEROBIC Blood Culture adequate volume Performed at [place] [address], [place], [place] [address]    Culture   Final    NO GROWTH < 24 HOURS Performed at [facility], [place] [address] [place], [place] [address]    Report Status PENDING  Incomplete         Radiology Studies: CT ABDOMEN PELVIS WO CONTRAST  Result Date: 12/28/2019 CLINICAL DATA:  Abdominal pain. EXAM: CT ABDOMEN AND PELVIS WITHOUT CONTRAST TECHNIQUE: Multidetector CT imaging of the abdomen and pelvis was performed following the standard protocol without IV contrast. COMPARISON:  Dec 15, 2019 FINDINGS: Lower chest: Mild atelectasis is seen within the posterior aspect of the bilateral lung bases. There are small bilateral pleural effusions. Hepatobiliary: Stable 8 mm focus of parenchymal low attenuation is seen within the inferior medial aspect of the right lobe of the liver. No gallstones, gallbladder wall thickening, or biliary dilatation. Pancreas: Unremarkable. No pancreatic ductal dilatation or surrounding inflammatory changes. Spleen: Normal in size without focal abnormality. Adrenals/Urinary Tract: Adrenal glands are unremarkable. The kidneys are normal in size. A stable 3.9 cm x 2.9 cm exophytic isodense area is seen along the posterior aspect of the upper pole of the right kidney. A moderate to marked amount of surrounding inflammatory fat stranding is seen which is increased in severity when compared to the prior study. A stable 2.2 cm exophytic cyst is seen along the anterior aspect of the mid left kidney. Bilateral 2 mm and 3 mm nonobstructing renal stones are seen. Bladder is unremarkable. Stomach/Bowel: Stomach is within normal limits. The appendix is not identified. Surgically anastomosed bowel is seen along the proximal portion of the transverse colon. No evidence of bowel dilatation. Noninflamed diverticula are seen within the descending and sigmoid colon. Vascular/Lymphatic: Moderate severity aortic calcification. No enlarged abdominal or pelvic lymph nodes. Reproductive: Prostate is unremarkable. Other: There is a 4.6 cm x 4.1 cm fat containing right inguinal hernia. A 3.6 cm x  2.7 cm fat containing left inguinal hernia is also seen. Musculoskeletal: No acute or significant osseous findings. IMPRESSION: 1. Small bilateral pleural effusions with mild bibasilar atelectasis. 2. Stable 3.9 cm x 2.9 cm exophytic isodense area along the posterior aspect of the upper pole of the right kidney with moderate to marked amount of surrounding inflammatory fat stranding. This likely represents a hemorrhagic cyst, with associated right-sided pyelonephritis. 3. Stable left renal cyst. 4. Bilateral nonobstructing renal stones. 5. Colonic diverticulosis. 6. Bilateral fat-containing inguinal hernias. Aortic Atherosclerosis (ICD10-I70.0). Electronically Signed   By: Virgina Norfolk M.D.   On: 12/28/2019 16:50   DG Chest 1 View  Result Date: 12/29/2019 CLINICAL DATA:  Shortness of breath EXAM: CHEST  1 VIEW COMPARISON:  Dec 27, 2019 FINDINGS: There is mild atelectatic change in the right lower lung region. The lungs elsewhere are clear. There is cardiomegaly with pulmonary vascularity normal. No adenopathy. No bone lesions. IMPRESSION: Mild right lower lung region atelectasis. Lungs elsewhere clear. Stable cardiomegaly. Electronically Signed   By: Lowella Grip III M.D.   On: 12/29/2019 13:42        Scheduled Meds: . allopurinol  200 mg Oral QPM  . aspirin EC  81 mg Oral QHS  . atorvastatin  40 mg Oral Daily  . carvedilol  12.5 mg Oral BID WC  . famotidine  20 mg Oral BID  . feeding supplement (ENSURE ENLIVE)  237 mL Oral BID BM  . feeding supplement (PRO-STAT SUGAR FREE 64)  30 mL Oral BID  . fluticasone  2 spray Each Nare Daily  . furosemide  40 mg Oral Daily  . heparin  5,000 Units Subcutaneous Q8H  . multivitamin with minerals  1 tablet Oral  Daily  . nutrition supplement (JUVEN)  1 packet Oral BID BM  . phenazopyridine  100 mg Oral TID WC  . potassium chloride SA  20 mEq Oral Daily  . predniSONE  5 mg Oral Daily  . sacubitril-valsartan  1 tablet Oral BID   Continuous  Infusions: . sodium chloride 250 mL (12/29/19 0658)  . ceFEPime (MAXIPIME) IV 2 g (12/30/19 0540)  . vancomycin 750 mg (12/30/19 0410)     LOS: 3 days    Time spent: 35 mins.More than 50% of that time was spent in counseling and/or coordination of care.      Shelly Coss, MD [facility] P5/21/2021, 7:48 AM

## 2019-12-30 NOTE — Progress Notes (Signed)
   12/30/19 0112  Assess: MEWS Score  ECG Heart Rate 91  Assess: MEWS Score  MEWS Temp 2  MEWS Systolic 0  MEWS Pulse 0  MEWS RR 1  MEWS LOC 0  MEWS Score 3  MEWS Score Color Yellow  Assess: if the MEWS score is Yellow or Red  Were vital signs taken at a resting state? Yes  Early Detection of Sepsis Score *See Row Information* High  MEWS guidelines implemented *See Row Information* Yes  Treat  MEWS Interventions Administered prn meds/treatments  Take Vital Signs  Increase Vital Sign Frequency  Yellow: Q 2hr X 2 then Q 4hr X 2, if remains yellow, continue Q 4hrs  Escalate  MEWS: Escalate Yellow: discuss with charge nurse/RN and consider discussing with provider and RRT  Notify: Provider  Provider Name/Title Blount  Date Provider Notified 12/30/19  Time Provider Notified 0105  Notification Type Page  Notification Reason Change in status  Response See new orders  Date of Provider Response 12/30/19  Time of Provider Response 0109  Document  Patient Outcome Stabilized after interventions  Progress note created (see row info) Yes    Pt noted to not be at baseline. Continued to moan and grunt uncontrollably while becoming tearful. Noted to be hot to touch, rectal temp checked, noted to be 103.6. After some tome verbalized pain to lower back, as for having chronic back pain. Blount notified, 1058mL bolus and  IV Tylenol ordered and given. PRN pain meds given for back pain. Will continue to assess and monitor pt for any further acute changes.

## 2019-12-31 ENCOUNTER — Inpatient Hospital Stay (HOSPITAL_COMMUNITY): Payer: Medicaid Other

## 2019-12-31 DIAGNOSIS — G9340 Encephalopathy, unspecified: Secondary | ICD-10-CM

## 2019-12-31 DIAGNOSIS — R509 Fever, unspecified: Secondary | ICD-10-CM

## 2019-12-31 DIAGNOSIS — J9601 Acute respiratory failure with hypoxia: Secondary | ICD-10-CM

## 2019-12-31 LAB — BASIC METABOLIC PANEL
Anion gap: 9 (ref 5–15)
BUN: 18 mg/dL (ref 8–23)
CO2: 23 mmol/L (ref 22–32)
Calcium: 8.1 mg/dL — ABNORMAL LOW (ref 8.9–10.3)
Chloride: 110 mmol/L (ref 98–111)
Creatinine, Ser: 1.65 mg/dL — ABNORMAL HIGH (ref 0.61–1.24)
GFR calc Af Amer: 50 mL/min — ABNORMAL LOW (ref >60)
GFR calc non Af Amer: 43 mL/min — ABNORMAL LOW (ref >60)
Glucose, Bld: 100 mg/dL — ABNORMAL HIGH (ref 70–99)
Potassium: 3.9 mmol/L (ref 3.5–5.1)
Sodium: 142 mmol/L (ref 135–145)

## 2019-12-31 LAB — CBC WITH DIFFERENTIAL/PLATELET
Abs Immature Granulocytes: 0.1 10*3/uL — ABNORMAL HIGH (ref 0.00–0.07)
Basophils Absolute: 0.1 10*3/uL (ref 0.0–0.1)
Basophils Relative: 0 %
Eosinophils Absolute: 0.3 10*3/uL (ref 0.0–0.5)
Eosinophils Relative: 2 %
HCT: 30.3 % — ABNORMAL LOW (ref 39.0–52.0)
Hemoglobin: 9.4 g/dL — ABNORMAL LOW (ref 13.0–17.0)
Immature Granulocytes: 1 %
Lymphocytes Relative: 11 %
Lymphs Abs: 1.8 10*3/uL (ref 0.7–4.0)
MCH: 28.8 pg (ref 26.0–34.0)
MCHC: 31 g/dL (ref 30.0–36.0)
MCV: 92.9 fL (ref 80.0–100.0)
Monocytes Absolute: 1.9 10*3/uL — ABNORMAL HIGH (ref 0.1–1.0)
Monocytes Relative: 12 %
Neutro Abs: 11.7 10*3/uL — ABNORMAL HIGH (ref 1.7–7.7)
Neutrophils Relative %: 74 %
Platelets: 278 10*3/uL (ref 150–400)
RBC: 3.26 MIL/uL — ABNORMAL LOW (ref 4.22–5.81)
RDW: 16.3 % — ABNORMAL HIGH (ref 11.5–15.5)
WBC: 15.8 10*3/uL — ABNORMAL HIGH (ref 4.0–10.5)
nRBC: 0 % (ref 0.0–0.2)

## 2019-12-31 LAB — COMPREHENSIVE METABOLIC PANEL
ALT: 17 U/L (ref 0–44)
AST: 16 U/L (ref 15–41)
Albumin: 2.2 g/dL — ABNORMAL LOW (ref 3.5–5.0)
Alkaline Phosphatase: 50 U/L (ref 38–126)
Anion gap: 9 (ref 5–15)
BUN: 22 mg/dL (ref 8–23)
CO2: 25 mmol/L (ref 22–32)
Calcium: 7.7 mg/dL — ABNORMAL LOW (ref 8.9–10.3)
Chloride: 109 mmol/L (ref 98–111)
Creatinine, Ser: 1.73 mg/dL — ABNORMAL HIGH (ref 0.61–1.24)
GFR calc Af Amer: 47 mL/min — ABNORMAL LOW (ref >60)
GFR calc non Af Amer: 41 mL/min — ABNORMAL LOW (ref >60)
Glucose, Bld: 120 mg/dL — ABNORMAL HIGH (ref 70–99)
Potassium: 4 mmol/L (ref 3.5–5.1)
Sodium: 143 mmol/L (ref 135–145)
Total Bilirubin: 1 mg/dL (ref 0.3–1.2)
Total Protein: 6.7 g/dL (ref 6.5–8.1)

## 2019-12-31 LAB — BLOOD GAS, ARTERIAL
Acid-base deficit: 3.3 mmol/L — ABNORMAL HIGH (ref 0.0–2.0)
Bicarbonate: 19.8 mmol/L — ABNORMAL LOW (ref 20.0–28.0)
O2 Saturation: 96.3 %
Patient temperature: 98.6
pCO2 arterial: 30.4 mmHg — ABNORMAL LOW (ref 32.0–48.0)
pH, Arterial: 7.43 (ref 7.350–7.450)
pO2, Arterial: 93.3 mmHg (ref 83.0–108.0)

## 2019-12-31 LAB — CBC
HCT: 28.3 % — ABNORMAL LOW (ref 39.0–52.0)
Hemoglobin: 8.8 g/dL — ABNORMAL LOW (ref 13.0–17.0)
MCH: 29.2 pg (ref 26.0–34.0)
MCHC: 31.1 g/dL (ref 30.0–36.0)
MCV: 94 fL (ref 80.0–100.0)
Platelets: 284 10*3/uL (ref 150–400)
RBC: 3.01 MIL/uL — ABNORMAL LOW (ref 4.22–5.81)
RDW: 16.5 % — ABNORMAL HIGH (ref 11.5–15.5)
WBC: 15 10*3/uL — ABNORMAL HIGH (ref 4.0–10.5)
nRBC: 0 % (ref 0.0–0.2)

## 2019-12-31 LAB — MAGNESIUM: Magnesium: 2.1 mg/dL (ref 1.7–2.4)

## 2019-12-31 LAB — PROCALCITONIN: Procalcitonin: 0.3 ng/mL

## 2019-12-31 LAB — ECHOCARDIOGRAM COMPLETE
Height: 66 in
Weight: 3820.13 oz

## 2019-12-31 LAB — PHOSPHORUS: Phosphorus: 2.8 mg/dL (ref 2.5–4.6)

## 2019-12-31 LAB — TROPONIN I (HIGH SENSITIVITY)
Troponin I (High Sensitivity): 184 ng/L (ref <18)
Troponin I (High Sensitivity): 112 ng/L (ref <18)

## 2019-12-31 LAB — BRAIN NATRIURETIC PEPTIDE: B Natriuretic Peptide: 1407.8 pg/mL — ABNORMAL HIGH (ref 0.0–100.0)

## 2019-12-31 LAB — LACTIC ACID, PLASMA: Lactic Acid, Venous: 1 mmol/L (ref 0.5–1.9)

## 2019-12-31 MED ORDER — POTASSIUM CHLORIDE 10 MEQ/100ML IV SOLN
10.0000 meq | Freq: Once | INTRAVENOUS | Status: AC
  Administered 2019-12-31: 10 meq via INTRAVENOUS

## 2019-12-31 MED ORDER — MEROPENEM 1 G IV SOLR: 2.0000 g | Freq: Three times a day (TID) | INTRAVENOUS | Status: DC

## 2019-12-31 MED ORDER — FAMOTIDINE IN NACL 20-0.9 MG/50ML-% IV SOLN
20.0000 mg | INTRAVENOUS | Status: DC
  Administered 2020-01-01: 20 mg via INTRAVENOUS
  Filled 2019-12-31 (×2): qty 50

## 2019-12-31 MED ORDER — SODIUM CHLORIDE 0.9% FLUSH
3.0000 mL | INTRAVENOUS | Status: DC | PRN
  Administered 2020-01-01: 3 mL via INTRAVENOUS

## 2019-12-31 MED ORDER — DEXMEDETOMIDINE HCL IN NACL 400 MCG/100ML IV SOLN
0.4000 ug/kg/h | INTRAVENOUS | Status: DC
  Administered 2020-01-01: 0.5 ug/kg/h via INTRAVENOUS
  Filled 2019-12-31 (×3): qty 100

## 2019-12-31 MED ORDER — RACEPINEPHRINE HCL 2.25 % IN NEBU
INHALATION_SOLUTION | RESPIRATORY_TRACT | Status: AC
  Administered 2019-12-31: 0.5 mL
  Filled 2019-12-31: qty 0.5

## 2019-12-31 MED ORDER — FUROSEMIDE 10 MG/ML IJ SOLN
40.0000 mg | Freq: Once | INTRAMUSCULAR | Status: AC
  Administered 2019-12-31: 40 mg via INTRAVENOUS

## 2019-12-31 MED ORDER — FUROSEMIDE 10 MG/ML IJ SOLN
40.0000 mg | Freq: Once | INTRAMUSCULAR | Status: AC
  Administered 2019-12-31: 40 mg via INTRAVENOUS
  Filled 2019-12-31: qty 4

## 2019-12-31 MED ORDER — POTASSIUM CHLORIDE 10 MEQ/50ML IV SOLN: 10.0000 meq | Freq: Once | INTRAVENOUS | Status: DC

## 2019-12-31 MED ORDER — DEXMEDETOMIDINE HCL IN NACL 200 MCG/50ML IV SOLN
0.4000 ug/kg/h | INTRAVENOUS | Status: DC
  Administered 2019-12-31 (×2): 0.5 ug/kg/h via INTRAVENOUS
  Administered 2019-12-31 (×2): 0.4 ug/kg/h via INTRAVENOUS
  Filled 2019-12-31 (×2): qty 50
  Filled 2019-12-31: qty 100

## 2019-12-31 MED ORDER — SODIUM CHLORIDE 0.9 % IV SOLN
1.0000 g | Freq: Three times a day (TID) | INTRAVENOUS | Status: DC
  Administered 2019-12-31 – 2020-01-02 (×5): 1 g via INTRAVENOUS
  Filled 2019-12-31 (×6): qty 1

## 2019-12-31 MED ORDER — VANCOMYCIN HCL IN DEXTROSE 1-5 GM/200ML-% IV SOLN
1000.0000 mg | INTRAVENOUS | Status: DC
  Administered 2020-01-01 – 2020-01-03 (×3): 1000 mg via INTRAVENOUS
  Filled 2019-12-31 (×2): qty 200

## 2019-12-31 MED ORDER — FUROSEMIDE 10 MG/ML IJ SOLN
INTRAMUSCULAR | Status: AC
  Filled 2019-12-31: qty 4

## 2019-12-31 MED ORDER — SODIUM CHLORIDE 0.9% FLUSH
3.0000 mL | Freq: Two times a day (BID) | INTRAVENOUS | Status: DC
  Administered 2019-12-31 – 2020-01-01 (×3): 3 mL via INTRAVENOUS

## 2019-12-31 MED ORDER — SODIUM CHLORIDE 0.9 % IV SOLN: 250.0000 mL | INTRAVENOUS | Status: DC | PRN

## 2019-12-31 MED ORDER — PANTOPRAZOLE SODIUM 40 MG IV SOLR
40.0000 mg | Freq: Every day | INTRAVENOUS | Status: DC
  Administered 2019-12-31: 40 mg via INTRAVENOUS
  Filled 2019-12-31: qty 40

## 2019-12-31 MED ORDER — RACEPINEPHRINE HCL 2.25 % IN NEBU: 0.5000 mL | INHALATION_SOLUTION | Freq: Once | RESPIRATORY_TRACT | Status: DC

## 2019-12-31 NOTE — Progress Notes (Signed)
Blood pressure elevated this AM at 180/78. Patient medicated with 10 mg of apresoline at 0824. At that time patient was completely disoriented and sometimes tachypneic, but no adventitious breath sounds were heard. Patient has a history of sleep apnea and was noncompliant with cpap overnight. Patient's sat noted to fall to 88% on room air when he was fully asleep so patient was placed on 2L nasal cannula. Left room after the medication and oxygen therapies were administered. Walked past patients room and noticed he was lying near the edge of the bed, entered room and could hear audible wheezing at the door. IV fluids immediately turned off. Patient agitated upon assessment as evidence by groaning, flailing in the beds, constantly pulling at medical equipment, and tachypnea with RR as high as the 40's. Patient noted to have upper airway expiratory wheezing upon auscultation. Paged MD. Retrieved albuterol breathing treatment and administered it to patient. Charge nurse, respiratory, and nearby CCM doctor notified as well. CCM ordered chest xray, ABG, and epinephrine breathing treatment. All interventions performed. Lasix ordered in response to respiratory distress and chest xray. Ativan discontinued and precedex ordered and started. Patient is currently resting in bed on bipap. Will continue to closely monitor at this time.

## 2019-12-31 NOTE — Progress Notes (Signed)
[facility]    Date of Admission:  12/27/2019   Total days of antibiotics 3           ID: Marco [name] is a 64 y.o. male with  Principal Problem:   Fever Active Problems:   CAD (coronary artery disease)   Atrial fibrillation (HCC)   Essential hypertension   Chronic kidney disease   Morbid obesity due to excess calories (HCC)   Chronic pain   Type 2 diabetes mellitus with stage 3 chronic kidney disease, without long-term current use of insulin (HCC)   OSA (obstructive sleep apnea)   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HCC)   Diabetic polyneuropathy associated with type 2 diabetes mellitus (HCC)   Chronic combined systolic and diastolic CHF (congestive heart failure) (HCC)   Hyperlipidemia   Urethral stricture   Nephrolithiasis    Subjective: Still having persistent fevers, remains on bipap. No vasopressors needed at this time. IR reviewed imaging of renal cyst - thought to be consistent with hemorrhagic cyst rather than abscess, will not need to be aspirated cxr c/w pulm edema Medications:  . allopurinol  200 mg Oral QPM  . aspirin EC  81 mg Oral QHS  . atorvastatin  40 mg Oral Daily  . carvedilol  12.5 mg Oral BID WC  . Chlorhexidine Gluconate Cloth  6 each Topical Daily  . famotidine  20 mg Oral BID  . feeding supplement (PRO-STAT SUGAR FREE 64)  30 mL Oral BID  . fluticasone  2 spray Each Nare Daily  . furosemide      . heparin  5,000 Units Subcutaneous Q8H  . mouth rinse  15 mL Mouth Rinse BID  . meropenem  2 g Intramuscular Q8H  . multivitamin with minerals  1 tablet Oral Daily  . nutrition supplement (JUVEN)  1 packet Oral BID BM  . pantoprazole (PROTONIX) IV  40 mg Intravenous Daily  . potassium chloride SA  20 mEq Oral Daily  . Racepinephrine HCl  0.5 mL Nebulization Once  . sodium chloride flush  10-40 mL Intracatheter Q12H  . sodium chloride flush  3 mL Intravenous Q12H    Objective: Vital signs in last 24 hours: Temp:  [99.9 F (37.7  C)-102.6 F (39.2 C)] 99.9 F (37.7 C) (05/22 1400) Pulse Rate:  [65-107] 65 (05/22 1500) Resp:  [18-32] 19 (05/22 1500) BP: (131-180)/(50-91) 152/68 (05/22 1500) SpO2:  [89 %-98 %] 93 % (05/22 1500) FiO2 (%):  [28 %] 28 % (05/22 1128) Physical Exam  Constitutional: He is sedated wearing bipap. He appears well-developed and well-nourished. No distress.  HENT:  Mouth/Throat: Oropharynx is clear and moist. No oropharyngeal exudate.  Cardiovascular: Normal rate, regular rhythm and normal heart sounds. Exam reveals no gallop and no friction rub.  No murmur heard.  Pulmonary/Chest: Effort normal and breath sounds normal. No respiratory distress. He has no wheezes.  Abdominal: Soft. Bowel sounds are normal. He exhibits no distension. There is no tenderness.  Ext: trace edema Skin: Skin is warm and dry. No rash noted. No erythema.   Lab Results Recent Labs    12/29/19 0526 12/30/19 0512 12/30/19 1106 12/31/19 0230  WBC  --  16.8*  --  15.8*  HGB  --  9.7*  --  9.4*  HCT  --  30.1*  --  30.3*  NA   < >  --  140 142  K   < >  --  4.2 3.9  CL   < >  --  105 110  CO2   < >  --  24 23  BUN   < >  --  17 18  CREATININE   < >  --  1.51* 1.65*   < > = values in this interval not displayed.    Microbiology: reviewed Studies/Results: MR BRAIN WO CONTRAST  Result Date: 12/30/2019 CLINICAL DATA:  Encephalopathy.  Sepsis. EXAM: MRI HEAD WITHOUT CONTRAST TECHNIQUE: Multiplanar, multiecho pulse sequences of the brain and surrounding structures were obtained without intravenous contrast. COMPARISON:  Head CT 12/27/2019 FINDINGS: Brain: Diffusion imaging does not show any acute or subacute infarction or other cause of restricted diffusion. No focal abnormality affects the brainstem or cerebellum. Cerebral hemispheres show minimal small vessel change of the white matter. No cortical or large vessel territory infarction. No mass lesion, hemorrhage, hydrocephalus or extra-axial collection. Vascular:  Major vessels at the base of the brain show flow. Skull and upper cervical spine: Negative Sinuses/Orbits: Opacification of the left maxillary sinus, the sphenoid sinus and some of the left ethmoid sinuses. Internal material is hypointense on T2 imaging. This could be chronic inspissated mucus or chronic fungal sinusitis. Other: None IMPRESSION: No significant intracranial finding. No acute infarction or other acute process. Sinus disease primarily affecting the left maxillary and sphenoid sinus. The sinuses could contain chronic inspissated mucus or could be affected by fungal sinusitis. Electronically Signed   By: Nelson Chimes M.D.   On: 12/30/2019 15:34   DG CHEST PORT 1 VIEW  Result Date: 12/31/2019 CLINICAL DATA:  64 year old male with a history of shortness of breath EXAM: PORTABLE CHEST 1 VIEW COMPARISON:  12/29/2019 FINDINGS: Cardiomediastinal silhouette unchanged with cardiomegaly. Interlobular septal thickening. Mild patchy airspace opacity in the lower lungs slightly increased from the comparison. No pneumothorax or pleural effusion. IMPRESSION: Evidence of early pulmonary edema and basilar atelectasis. Electronically Signed   By: Corrie Mckusick D.O.   On: 12/31/2019 09:43   ECHOCARDIOGRAM COMPLETE  Result Date: 12/31/2019    ECHOCARDIOGRAM REPORT   Patient Name:   Marco [name] Date of Exam: 12/31/2019 Medical Rec #:  [id number]     Height:       66.0 in Accession #:    [id number]    Weight:       238.8 lb Date of Birth:  12/16/1955     BSA:          2.156 m Patient Age:    89 years      BP:           180/78 mmHg Patient Gender: M             HR:           95 bpm. Exam Location:  Inpatient Procedure: 2D Echo Indications:    fever 780.6  History:        Patient has prior history of Echocardiogram examinations. CHF,                 CAD, Arrythmias:Atrial Fibrillation; Risk Factors:Diabetes,                 Hypertension and Former Smoker.  Sonographer:    Jannett Celestine RDCS (AE) Referring Phys: [id number]  [facility]  Sonographer Comments: Image acquisition challenging due to respiratory motion. restricted mobility. patient on BiPap machine during exam IMPRESSIONS  1. Mild hypokinesis of the distal septum and apex. Left ventricular ejection fraction, by estimation, is 50 to 55%. The left ventricle has low normal function. The left ventricle demonstrates  regional wall motion abnormalities (see scoring diagram/findings for description). There is mild concentric left ventricular hypertrophy. Left ventricular diastolic parameters are consistent with Grade I diastolic dysfunction (impaired relaxation).  2. Right ventricular systolic function is normal. The right ventricular size is normal.  3. Right atrial size was mildly dilated.  4. The mitral valve is normal in structure. No evidence of mitral valve regurgitation. No evidence of mitral stenosis.  5. The aortic valve is tricuspid. Aortic valve regurgitation is not visualized. No aortic stenosis is present.  6. The inferior vena cava is normal in size with greater than 50% respiratory variability, suggesting right atrial pressure of 3 mmHg. FINDINGS  Left Ventricle: Mild hypokinesis of the distal septum and apex. Left ventricular ejection fraction, by estimation, is 50 to 55%. The left ventricle has low normal function. The left ventricle demonstrates regional wall motion abnormalities. The left ventricular internal cavity size was normal in size. There is mild concentric left ventricular hypertrophy. Left ventricular diastolic parameters are consistent with Grade I diastolic dysfunction (impaired relaxation). Indeterminate filling pressures. Right Ventricle: The right ventricular size is normal. No increase in right ventricular wall thickness. Right ventricular systolic function is normal. Left Atrium: Left atrial size was normal in size. Right Atrium: Right atrial size was mildly dilated. Pericardium: There is no evidence of pericardial effusion. Mitral Valve: The  mitral valve is normal in structure. Normal mobility of the mitral valve leaflets. No evidence of mitral valve regurgitation. No evidence of mitral valve stenosis. Tricuspid Valve: The tricuspid valve is normal in structure. Tricuspid valve regurgitation is trivial. No evidence of tricuspid stenosis. Aortic Valve: The aortic valve is tricuspid. Aortic valve regurgitation is not visualized. No aortic stenosis is present. Pulmonic Valve: The pulmonic valve was normal in structure. Pulmonic valve regurgitation is not visualized. No evidence of pulmonic stenosis. Aorta: The aortic root is normal in size and structure. Venous: The inferior vena cava is normal in size with greater than 50% respiratory variability, suggesting right atrial pressure of 3 mmHg. IAS/Shunts: No atrial level shunt detected by color flow Doppler.  LEFT VENTRICLE PLAX 2D LVIDd:         5.30 cm  Diastology LVIDs:         3.70 cm  LV e' lateral:   6.20 cm/s LV PW:         1.20 cm  LV E/e' lateral: 12.3 LV IVS:        1.20 cm  LV e' medial:    5.77 cm/s LVOT diam:     2.20 cm  LV E/e' medial:  13.2 LV SV:         64 LV SV Index:   30 LVOT Area:     3.80 cm  RIGHT VENTRICLE RV S prime:     15.30 cm/s TAPSE (M-mode): 2.0 cm LEFT ATRIUM             Index       RIGHT ATRIUM           Index LA diam:        3.20 cm 1.48 cm/m  RA Area:     23.10 cm LA Vol (A2C):   49.6 ml 23.00 ml/m RA Volume:   70.00 ml  32.47 ml/m LA Vol (A4C):   45.3 ml 21.01 ml/m LA Biplane Vol: 51.4 ml 23.84 ml/m  AORTIC VALVE LVOT Vmax:   112.00 cm/s LVOT Vmean:  83.800 cm/s LVOT VTI:    0.168 m  AORTA Ao Root  diam: 3.20 cm MITRAL VALVE               TRICUSPID VALVE MV Area (PHT): 3.77 cm    TR Peak grad:   28.3 mmHg MV Decel Time: 201 msec    TR Vmax:        266.00 cm/s MV E velocity: 76.30 cm/s MV A velocity: 87.80 cm/s  SHUNTS MV E/A ratio:  0.87        Systemic VTI:  0.17 m                            Systemic Diam: 2.20 cm Skeet Latch MD Electronically signed by  Skeet Latch MD Signature Date/Time: 12/31/2019/12:49:48 PM    Final      Assessment/Plan:  Right sided pyelonpephritis = since still ongoing fevers and no appreciable improvement with WBC, will broaden cefepime to meropenem in case he has ESBL ecoli. Hx of ecoli uti. Continue with vancomycin for the time being  Other sources of infection - mri did not show intra-cranial process but did comment on sinusitis. Will wait to see if any further improvement with current abtx change to whether to try to sample sinuses/or add antifungal coverage  Respiratory distress = continue on bipap, and diuresis if needed  Ckd= will renally dose abtx  [facility] Cell: [phone] Pager: [phone]  12/31/2019, 3:25 PM

## 2019-12-31 NOTE — Progress Notes (Signed)
Attempted lower extremity venous duplex. Patient's legs are completely contracted, unable to separate or evaluate any segment of the leg. Will d/c order.  12/31/2019 3:57 PM Maudry Mayhew, MHA, RVT, RDCS, RDMS

## 2019-12-31 NOTE — Progress Notes (Signed)
MEDICATION RELATED CONSULT NOTE - INITIAL    Pharmacy Consult for Evaluation of QT prolonging medications Indication:   Allergies  Allergen Reactions  . Nsaids Other (See Comments)    Stomach pains. Ulcers - stated by patient     Patient Measurements: Height: 5\' 6"  (167.6 cm) Weight: 108.3 kg (238 lb 12.1 oz) IBW/kg (Calculated) : 63.8 Adjusted Body Weight:   Vital Signs: Temp: 99.9 F (37.7 C) (05/22 1400) Temp Source: Axillary (05/22 1400) BP: 152/68 (05/22 1500) Pulse Rate: 65 (05/22 1500) Intake/Output from previous day: 05/21 0701 - 05/22 0700 In: 1526.8 [I.V.:812.8; IV [id number] Out: 1050 [Urine:1050] Intake/Output from this shift: Total I/O In: 262.8 [I.V.:65.8; IV Piggyback:197] Out: 150 [Urine:150]  Labs: Recent Labs    12/29/19 0526 12/30/19 0512 12/30/19 1106 12/31/19 0230 12/31/19 1203  WBC  --  16.8*  --  15.8* 15.0*  HGB  --  9.7*  --  9.4* 8.8*  HCT  --  30.1*  --  30.3* 28.3*  PLT  --  267  --  278 284  CREATININE   < >  --  1.51* 1.65* 1.73*  MG  --   --   --   --  2.1  PHOS  --   --   --   --  2.8  ALBUMIN  --   --   --   --  2.2*  PROT  --   --   --   --  6.7  AST  --   --   --   --  16  ALT  --   --   --   --  17  ALKPHOS  --   --   --   --  50  BILITOT  --   --   --   --  1.0   < > = values in this interval not displayed.   Estimated Creatinine Clearance: 49.8 mL/min (A) (by C-G formula based on SCr of 1.73 mg/dL (H)).   Microbiology: Recent Results (from the past 720 hour(s))  Blood Culture (routine x 2)     Status: None   Collection Time: 12/08/19  8:14 PM   Specimen: BLOOD LEFT FOREARM  Result Value Ref Range Status   Specimen Description BLOOD LEFT FOREARM  Final   Special Requests   Final    BOTTLES DRAWN AEROBIC ONLY Blood Culture results may not be optimal due to an inadequate volume of blood received in culture bottles Performed at [facility], [place] [address] [place], [place] [address]     Culture   Final    NO GROWTH 5 DAYS Performed at [facility], [place] [address] [place], [place] [address]    Report Status 12/13/2019 FINAL  Final  Urine culture     Status: Abnormal   Collection Time: 12/08/19  8:14 PM   Specimen: In/Out Cath Urine  Result Value Ref Range Status   Specimen Description   Final    IN/OUT CATH URINE Performed at [place] [address] [place], [place] [address]    Special Requests   Final    NONE Performed at [facility], [place] [address] [place], [place] [address]    Culture >=100,000 COLONIES/mL ESCHERICHIA COLI (A)  Final   Report Status 12/10/2019 FINAL  Final   Organism ID, Bacteria ESCHERICHIA COLI (A)  Final      Susceptibility   Escherichia coli - MIC*  AMPICILLIN >=32 RESISTANT Resistant     CEFAZOLIN >=64 RESISTANT Resistant     CEFTRIAXONE <=1 SENSITIVE Sensitive     CIPROFLOXACIN <=0.25 SENSITIVE Sensitive     GENTAMICIN <=1 SENSITIVE Sensitive     IMIPENEM <=0.25 SENSITIVE Sensitive     NITROFURANTOIN <=16 SENSITIVE Sensitive     TRIMETH/SULFA <=20 SENSITIVE Sensitive     AMPICILLIN/SULBACTAM >=32 RESISTANT Resistant     PIP/TAZO 8 SENSITIVE Sensitive     * >=100,000 COLONIES/mL ESCHERICHIA COLI  Blood Culture (routine x 2)     Status: None   Collection Time: 12/08/19  8:45 PM   Specimen: BLOOD  Result Value Ref Range Status   Specimen Description   Final    BLOOD RIGHT HAND Performed at [facility] [address], [place], [place] [address]    Special Requests   Final    BOTTLES DRAWN AEROBIC AND ANAEROBIC Blood Culture results may not be optimal due to an excessive volume of blood received in culture bottles Performed at [place] [address], [place], [place] [address]    Culture   Final    NO GROWTH 5 DAYS Performed at [facility], [place] [address], [place], [place] [address]    Report Status 12/13/2019 FINAL  Final   Respiratory Panel by RT PCR (Flu A&B, Covid) - Nasopharyngeal Swab     Status: None   Collection Time: 12/08/19 10:00 PM   Specimen: Nasopharyngeal Swab  Result Value Ref Range Status   SARS Coronavirus 2 by RT PCR NEGATIVE NEGATIVE Final    Comment: (NOTE) SARS-CoV-2 target nucleic acids are NOT DETECTED. The SARS-CoV-2 RNA is generally detectable in upper respiratoy specimens during the acute phase of infection. The lowest concentration of SARS-CoV-2 viral copies this assay can detect is 131 copies/mL. A negative result does not preclude SARS-Cov-2 infection and should not be used as the sole basis for treatment or other patient management decisions. A negative result may occur with  improper specimen collection/handling, submission of specimen other than nasopharyngeal swab, presence of viral mutation(s) within the areas targeted by this assay, and inadequate number of viral copies (<131 copies/mL). A negative result must be combined with clinical observations, patient history, and epidemiological information. The expected result is Negative. Fact Sheet for Patients:  PinkCheek.be Fact Sheet for Healthcare Providers:  GravelBags.it This test is not yet ap proved or cleared by the [place] FDA and  has been authorized for detection and/or diagnosis of SARS-CoV-2 by FDA under an Emergency Use Authorization (EUA). This EUA will remain  in effect (meaning this test can be used) for the duration of the COVID-19 declaration under Section 564(b)(1) of the Act, 21 U.S.C. section 360bbb-3(b)(1), unless the authorization is terminated or revoked sooner.    Influenza A by PCR NEGATIVE NEGATIVE Final   Influenza B by PCR NEGATIVE NEGATIVE Final    Comment: (NOTE) The Xpert Xpress SARS-CoV-2/FLU/RSV assay is intended as an aid in  the diagnosis of influenza from Nasopharyngeal swab specimens and  should not be used as a sole  basis for treatment. Nasal washings and  aspirates are unacceptable for Xpert Xpress SARS-CoV-2/FLU/RSV  testing. Fact Sheet for Patients: PinkCheek.be Fact Sheet for Healthcare Providers: GravelBags.it This test is not yet approved or cleared by the [place] FDA and  has been authorized for detection and/or diagnosis of SARS-CoV-2 by  FDA under an Emergency Use Authorization (EUA). This EUA will remain  in effect (meaning this  test can be used) for the duration of the  Covid-19 declaration under Section 564(b)(1) of the Act, 21  U.S.C. section 360bbb-3(b)(1), unless the authorization is  terminated or revoked. Performed at [facility], [place] [address], [place], [place] [address]   Urine Culture     Status: None   Collection Time: 12/15/19 11:12 AM   Specimen: Urine, Clean Catch  Result Value Ref Range Status   Specimen Description   Final    URINE, CLEAN CATCH Performed at [facility], [place] [address] [place], [place] [address]    Special Requests   Final    NONE Performed at [facility], [place] [address] [place], [place] [address]    Culture   Final    NO GROWTH Performed at [facility], [place] [address] [place], [place] [address]    Report Status 12/16/2019 FINAL  Final  Culture, blood (Routine X 2) w Reflex to ID Panel     Status: None   Collection Time: 12/15/19 11:33 AM   Specimen: BLOOD  Result Value Ref Range Status   Specimen Description   Final    BLOOD RIGHT ANTECUBITAL Performed at [place] [address] [place], [place] [address]    Special Requests   Final    BOTTLES DRAWN AEROBIC AND ANAEROBIC Blood Culture adequate volume Performed at [place] [address], [place], [place] [address]    Culture   Final    NO GROWTH 5 DAYS Performed at [facility], [place] [address],  [place], [place] [address]    Report Status 12/20/2019 FINAL  Final  Culture, blood (Routine X 2) w Reflex to ID Panel     Status: None   Collection Time: 12/15/19 11:53 AM   Specimen: BLOOD  Result Value Ref Range Status   Specimen Description   Final    BLOOD LEFT ANTECUBITAL Performed at [place] [address], [place], [place] [address]    Special Requests   Final    BOTTLES DRAWN AEROBIC AND ANAEROBIC Blood Culture adequate volume Performed at [place] [address], [place], [place] [address]    Culture   Final    NO GROWTH 5 DAYS Performed at [facility], [place] [address] [place], [place] [address]    Report Status 12/20/2019 FINAL  Final  Respiratory Panel by RT PCR (Flu A&B, Covid) - Nasopharyngeal Swab     Status: None   Collection Time: 12/15/19  3:36 PM   Specimen: Nasopharyngeal Swab  Result Value Ref Range Status   SARS Coronavirus 2 by RT PCR NEGATIVE NEGATIVE Final    Comment: (NOTE) SARS-CoV-2 target nucleic acids are NOT DETECTED. The SARS-CoV-2 RNA is generally detectable in upper respiratoy specimens during the acute phase of infection. The lowest concentration of SARS-CoV-2 viral copies this assay can detect is 131 copies/mL. A negative result does not preclude SARS-Cov-2 infection and should not be used as the sole basis for treatment or other patient management decisions. A negative result may occur with  improper specimen collection/handling, submission of specimen other than nasopharyngeal swab, presence of viral mutation(s) within the areas targeted by this assay, and inadequate number of viral copies (<131 copies/mL). A negative result must be combined with clinical observations, patient history, and epidemiological information. The expected result is Negative. Fact Sheet for Patients:  PinkCheek.be Fact Sheet for Healthcare Providers:   GravelBags.it  This test is not yet ap proved or cleared by the [place] and  has been authorized for detection and/or diagnosis of SARS-CoV-2 by FDA under an Emergency Use Authorization (EUA). This EUA will remain  in effect (meaning this test can be used) for the duration of the COVID-19 declaration under Section 564(b)(1) of the Act, 21 U.S.C. section 360bbb-3(b)(1), unless the authorization is terminated or revoked sooner.    Influenza A by PCR NEGATIVE NEGATIVE Final   Influenza B by PCR NEGATIVE NEGATIVE Final    Comment: (NOTE) The Xpert Xpress SARS-CoV-2/FLU/RSV assay is intended as an aid in  the diagnosis of influenza from Nasopharyngeal swab specimens and  should not be used as a sole basis for treatment. Nasal washings and  aspirates are unacceptable for Xpert Xpress SARS-CoV-2/FLU/RSV  testing. Fact Sheet for Patients: PinkCheek.be Fact Sheet for Healthcare Providers: GravelBags.it This test is not yet approved or cleared by the [place] FDA and  has been authorized for detection and/or diagnosis of SARS-CoV-2 by  FDA under an Emergency Use Authorization (EUA). This EUA will remain  in effect (meaning this test can be used) for the duration of the  Covid-19 declaration under Section 564(b)(1) of the Act, 21  U.S.C. section 360bbb-3(b)(1), unless the authorization is  terminated or revoked. Performed at [facility], [place] [address], [place], [place] [address]   SARS Coronavirus 2 by RT PCR (hospital order, performed in [facility] hospital lab) Nasopharyngeal Nasopharyngeal Swab     Status: None   Collection Time: 12/27/19  4:44 AM   Specimen: Nasopharyngeal Swab  Result Value Ref Range Status   SARS Coronavirus 2 NEGATIVE NEGATIVE Final    Comment: (NOTE) SARS-CoV-2 target nucleic acids are NOT DETECTED. The SARS-CoV-2 RNA is generally detectable in  upper and lower respiratory specimens during the acute phase of infection. The lowest concentration of SARS-CoV-2 viral copies this assay can detect is 250 copies / mL. A negative result does not preclude SARS-CoV-2 infection and should not be used as the sole basis for treatment or other patient management decisions.  A negative result may occur with improper specimen collection / handling, submission of specimen other than nasopharyngeal swab, presence of viral mutation(s) within the areas targeted by this assay, and inadequate number of viral copies (<250 copies / mL). A negative result must be combined with clinical observations, patient history, and epidemiological information. Fact Sheet for Patients:   StrictlyIdeas.no Fact Sheet for Healthcare Providers: BankingDealers.co.za This test is not yet approved or cleared  by the [place] FDA and has been authorized for detection and/or diagnosis of SARS-CoV-2 by FDA under an Emergency Use Authorization (EUA).  This EUA will remain in effect (meaning this test can be used) for the duration of the COVID-19 declaration under Section 564(b)(1) of the Act, 21 U.S.C. section 360bbb-3(b)(1), unless the authorization is terminated or revoked sooner. Performed at [facility], [place] [address], [place], [place] [address]   Urine culture     Status: None   Collection Time: 12/27/19  4:44 AM   Specimen: Urine, Clean Catch  Result Value Ref Range Status   Specimen Description   Final    URINE, CLEAN CATCH Performed at [facility], [place] [address] [place], [place] [address]    Special Requests   Final    NONE Performed at [facility], [place] [address], [place], [place] [address]    Culture   Final  NO GROWTH Performed at [facility], [place] [address] [place], [place] [address]    Report Status 12/28/2019 FINAL  Final  Blood  culture (routine x 2)     Status: None (Preliminary result)   Collection Time: 12/27/19  7:01 AM   Specimen: BLOOD  Result Value Ref Range Status   Specimen Description   Final    BLOOD RIGHT ANTECUBITAL Performed at [place] [address] [place], [place] [address]    Special Requests   Final    BOTTLES DRAWN AEROBIC AND ANAEROBIC Blood Culture adequate volume Performed at [place] [address] [place], [place] [address]    Culture   Final    NO GROWTH 4 DAYS Performed at [facility], [place] [address] [place], [place] [address]    Report Status PENDING  Incomplete  Blood culture (routine x 2)     Status: None (Preliminary result)   Collection Time: 12/27/19  7:27 AM   Specimen: BLOOD  Result Value Ref Range Status   Specimen Description   Final    BLOOD LEFT ANTECUBITAL Performed at [place] [address], [place], [place] [address]    Special Requests   Final    BOTTLES DRAWN AEROBIC AND ANAEROBIC Blood Culture adequate volume Performed at [place] [address] [place], [place] [address]    Culture   Final    NO GROWTH 4 DAYS Performed at [facility], [place] [address], [place], [place] [address]    Report Status PENDING  Incomplete  Culture, blood (routine x 2)     Status: None (Preliminary result)   Collection Time: 12/29/19 10:06 AM   Specimen: BLOOD  Result Value Ref Range Status   Specimen Description   Final    BLOOD RIGHT ARM Performed at [facility] [place] [address], [place], [place] [address]    Special Requests   Final    BOTTLES DRAWN AEROBIC AND ANAEROBIC Blood Culture adequate volume Performed at [place] [address] [place], [place] [address]    Culture   Final    NO GROWTH 2 DAYS Performed at [place] [address] [place], [place] [address]    Report Status PENDING  Incomplete   Culture, blood (routine x 2)     Status: None (Preliminary result)   Collection Time: 12/29/19 10:07 AM   Specimen: BLOOD  Result Value Ref Range Status   Specimen Description   Final    BLOOD RIGHT ARM Performed at [place] [address], [place], [place] [address]    Special Requests   Final    BOTTLES DRAWN AEROBIC AND ANAEROBIC Blood Culture adequate volume Performed at [place] [address], [place], [place] [address]    Culture   Final    NO GROWTH 2 DAYS Performed at [place] [address] [place] [address] [place], [place] [address]    Report Status PENDING  Incomplete  MRSA PCR Screening     Status: None   Collection Time: 12/30/19  1:14 PM   Specimen: Nasal Mucosa; Nasopharyngeal  Result Value Ref Range Status   MRSA by PCR NEGATIVE NEGATIVE Final    Comment:        The GeneXpert MRSA Assay (FDA approved for NASAL specimens only), is one component of a comprehensive MRSA colonization surveillance program. It is not  intended to diagnose MRSA infection nor to guide or monitor treatment for MRSA infections. Performed at [facility], [place] [address], [place], [place] [address]     Medical History: Past Medical History:  Diagnosis Date  . Benign colon polyp 08/01/2013   [facility] in [place]. large base tranverse colon polyp was biopsied. polyp was benign with minimal surface hyperplastic change.  . Chronic combined systolic and diastolic CHF (congestive heart failure) ([place])   . Chronic pain   . CKD (chronic kidney disease), stage III   . Diabetes mellitus without complication ([place])   . Diverticulosis   . Esophageal hiatal hernia 07/29/2013   confirmed on EGD   . Esophageal stricture   . Essential hypertension   . Gastritis 07/29/2013   confirmed on EGD, bx done an negative for intestinal metaplasia, dsyplasia or H. pylori. normal gastric emptying study done 07/13/2013.  [name] GERD  (gastroesophageal reflux disease)   . Morbid obesity ([place])   . Non-obstructive CAD    a. 02/2013 Cath ([place]): nonobs dzs;  b. 09/2014 Myoview ([place]): EF 55%, no ischemia;  c. Cath 05/2018 mild CAD no obstruction.  . Persistent atrial fibrillation ([place])    a. 02/2013 s/p rfca in [place], [place]-->prev on Xarelto, d/c'd 2/2 anemia, ? GIB.  [name] Rheumatoid arthritis ([place])   . Sigmoid diverticulosis 08/01/2013   confirmed on colonscopy. record scanned into chart    Medications:  Scheduled:  . allopurinol  200 mg Oral QPM  . aspirin EC  81 mg Oral QHS  . atorvastatin  40 mg Oral Daily  . carvedilol  12.5 mg Oral BID WC  . Chlorhexidine Gluconate Cloth  6 each Topical Daily  . feeding supplement (PRO-STAT SUGAR FREE 64)  30 mL Oral BID  . fluticasone  2 spray Each Nare Daily  . furosemide      . heparin  5,000 Units Subcutaneous Q8H  . mouth rinse  15 mL Mouth Rinse BID  . multivitamin with minerals  1 tablet Oral Daily  . nutrition supplement (JUVEN)  1 packet Oral BID BM  . pantoprazole (PROTONIX) IV  40 mg Intravenous Daily  . potassium chloride SA  20 mEq Oral Daily  . Racepinephrine HCl  0.5 mL Nebulization Once  . sodium chloride flush  10-40 mL Intracatheter Q12H  . sodium chloride flush  3 mL Intravenous Q12H    Assessment: Pharmacy is consulted to evaluate pt's current meds in light of pt's recent EKG which showed a QTC of 525 msec.     Plan:   Upon review of pt's current meds, The medications listed with rare reports of QTc prolongation are pantoprazole and Precedex.   Per MD note " Continue Precedex with continued monitoring with QTC in mind"   Notified MD of the two meds per secure chat.   Will monitor EKG, new med additions to chart as available.    Royetta Asal, PharmD, BCPS 12/31/2019 4:50 PM

## 2019-12-31 NOTE — Consult Note (Signed)
NAME:  Lander Hemauer, MRN:  [id number], DOB:  12-06-1955, LOS: 4 ADMISSION DATE:  12/27/2019, CONSULTATION DATE:  12/31/19 REFERRING MD:  Dr Shelly Coss, CHIEF COMPLAINT:  Acute resp failure and acute encephhalopathy  Brief History   See below  History of present illness    64 year old male with bilateral testicular atrophy and hypogonadism, incidental right upper pole renal cyst and ultrasound 2016 obesity, OSA?  Using CPAP diabetes, gout on allopurinol, anxiety on duloxetine and lorazepam, atrial fibrillation status post ablation in [place] in 2017, hypertension, chronic kidney disease stage III and nonischemic systolic and diastolic cardiomyopathy [EF 35-40%] with admission in 2019 for acute on chronic systolic and diastolic heart failure requiring significant diuresis.  Although this had improved by March 2021 to a year 55/60%.  Home cardiac medications included aspirin 81 mg, Lipitor Coreg, Imdur, hydralazine, Entresto and Lasix.  Also noted to be on tafamidis [given in amyloidosis related cardiomyopathy] also with diabetic neuropathy on Lyrica.  Psoriatic arthritis on daily prednisone and oxycodone and Cosentyx.  Despite all this at baseline he is physically active ambulates without any problems.   He was then admitted December 09, 2019 for 5 days and discharged Dec 13, 2019 with a diagnosis of severe sepsis due to right kidney pyelonephritisWith urine growing multidrug-resistant E. coli and acute kidney injury with creatinine changing from a baseline of 1.5 mg percent to a peak of 5.6 mg percent and returning to baseline at the time of discharge.  Course was complicated by acute metabolic encephalopathy sepsis and hypoglycemia related that was improving slowly.  CT head was negative for acute process.    He then got readmitted 2 weeks later on Dec 27 2019 with weakness and lethargy with continued urinary frequency, dysuria and incontinence and diffuse joint pain.  Repeat urine culture has been  negative found to be febrile on admission with elevated lactic acid treated with fluids.  Antibiotics were initiated but despite antibiotic started having continued fevers and mild leukocytosis.[name]  His CT on Dec 28, 2019 showed 4 cm exophytic right upper pole renal cyst with more stranding.  On Dec 29, 2019: Urology consulted and felt to have low suspicion for renal cell carcinoma but persistent pyelonephritis with possible translocation of bacteria to cyst which is now infected and recommendations to follow with abdominal CT. On Dec 30, 2019: Infectious disease consulted -with recommendation for lumbar puncture which could not be done.  Then on Dec 31, 2019-overnight transferred to the ICU because of hypoxemia and confusion.  And then abrupt stridor/wheeze and respiratory distress with ongoing confusion that did not respond to racemic epinephrine but responded subsequently to BiPAP, Precedex and Lasix under critical care supervision.   Past Medical History   Patient initially seen by Dr. Karsten Ro in May 2017 for hypogonadism, noted to have bilateral testicular atrophy at that time.  When seen in March 2018, noted to have lower urinary tract symptoms as well as dysuria.  Negative urine culture at that time.  Treated for prostatitis.  PVR was 22 mL.  Seen several times in the clinic after that with persistent but vague dysuria, treated with alpha-block when seen in August 2019, given persistent symptoms, he underwent cystourethroscopy with dilation of a moderate fossa navicularis stricture using Leander Rams sounds. Followup PVR in September 2020 was 64 cc.  PSA 1.2 (10/20/2018)  has a past medical history of Benign colon polyp (08/01/2013), Chronic combined systolic and diastolic CHF (congestive heart failure) ([place]), Chronic pain, CKD (chronic kidney disease),  stage III, Diabetes mellitus without complication ([place]), Diverticulosis, Esophageal hiatal hernia (07/29/2013), Esophageal stricture, Essential  hypertension, Gastritis (07/29/2013), GERD (gastroesophageal reflux disease), Morbid obesity ([place]), Non-obstructive CAD, Persistent atrial fibrillation ([place]), Rheumatoid arthritis ([place]), and Sigmoid diverticulosis (08/01/2013).   reports that he quit smoking about 11 years ago. His smoking use included cigarettes. He has a 5.00 pack-year smoking history. He has never used smokeless tobacco.  Past Surgical History:  Procedure Laterality Date  . CARDIAC CATHETERIZATION  05/2014   ablation for atrial fibrillation  . CARDIAC CATHETERIZATION N/A 11/16/2015   Procedure: Left Heart Cath and Coronary Angiography;  Surgeon: Leonie Man, MD;  Location: [facility];  Service: Cardiovascular;  Laterality: N/A;  . COLON RESECTION  09/2013   due to large, abnormal polpy. non cancerous per patient.   . COLON SURGERY  09/2014   colon resection   . LEFT HEART CATH AND CORONARY ANGIOGRAPHY N/A 05/19/2018   Procedure: LEFT HEART CATH AND CORONARY ANGIOGRAPHY;  Surgeon: Wellington Hampshire, MD;  Location: [facility];  Service: Cardiovascular;  Laterality: N/A;    Allergies  Allergen Reactions  . Nsaids Other (See Comments)    Stomach pains. Ulcers - stated by patient      There is no immunization history on file for this patient.  Family History  Problem Relation Age of Onset  . Hypertension Mother   . Diabetes Mother   . COPD Mother   . Lung cancer Mother        Smoker   . Hypertension Father   . Diabetes Father   . Heart Problems Father   . COPD Father   . Colon cancer Neg Hx   . Stomach cancer Neg Hx      Current Facility-Administered Medications:  .  0.9 %  sodium chloride infusion, , Intravenous, PRN, Shelly Coss, MD, Stopped at 12/30/19 1700 .  0.9 %  sodium chloride infusion, , Intravenous, Continuous, Zacharia Sowles, MD, Last Rate: 10 mL/hr at 12/31/19 0904, Rate Change at 12/31/19 0904 .  acetaminophen (TYLENOL) suppository 325 mg, 325 mg, Rectal, Q4H PRN,  Shelly Coss, MD, 325 mg at 12/31/19 0204 .  acetaminophen (TYLENOL) tablet 650 mg, 650 mg, Oral, Q6H PRN, Lang Snow, FNP, 650 mg at 12/29/19 0535 .  albuterol (PROVENTIL) (2.5 MG/3ML) 0.083% nebulizer solution 2.5 mg, 2.5 mg, Inhalation, Q6H PRN, Tawanna Solo, Amrit, MD, 2.5 mg at 12/31/19 0900 .  allopurinol (ZYLOPRIM) tablet 200 mg, 200 mg, Oral, QPM, Adhikari, Amrit, MD, 200 mg at 12/29/19 1757 .  aspirin EC tablet 81 mg, 81 mg, Oral, QHS, Adhikari, Amrit, MD, 81 mg at 12/29/19 2147 .  atorvastatin (LIPITOR) tablet 40 mg, 40 mg, Oral, Daily, Adhikari, Amrit, MD, 40 mg at 12/30/19 0816 .  carvedilol (COREG) tablet 12.5 mg, 12.5 mg, Oral, BID WC, Adhikari, Amrit, MD, 12.5 mg at 12/30/19 0816 .  ceFEPIme (MAXIPIME) 2 g in sodium chloride 0.9 % 100 mL IVPB, 2 g, Intravenous, Q8H, Shelly Coss, MD, Stopped at 12/31/19 0618 .  Chlorhexidine Gluconate Cloth 2 % PADS 6 each, 6 each, Topical, Daily, Shelly Coss, MD, 6 each at 12/31/19 1031 .  dexmedetomidine (PRECEDEX) 200 MCG/50ML (4 mcg/mL) infusion, 0.4-1.2 mcg/kg/hr, Intravenous, Titrated, Talik Casique, MD, Last Rate: 10.83 mL/hr at 12/31/19 0903, 0.4 mcg/kg/hr at 12/31/19 0903 .  famotidine (PEPCID) tablet 20 mg, 20 mg, Oral, BID, Tawanna Solo, Amrit, MD, 20 mg at 12/30/19 0816 .  feeding supplement (PRO-STAT SUGAR FREE 64) liquid 30 mL, 30 mL, Oral,  BID, Shelly Coss, MD, 30 mL at 12/29/19 2015 .  fluticasone (FLONASE) 50 MCG/ACT nasal spray 2 spray, 2 spray, Each Nare, Daily, Adhikari, Amrit, MD, 2 spray at 12/30/19 0817 .  furosemide (LASIX) 10 MG/ML injection, , , ,  .  heparin injection 5,000 Units, 5,000 Units, Subcutaneous, Q8H, Adhikari, Amrit, MD, 5,000 Units at 12/31/19 0545 .  hydrALAZINE (APRESOLINE) injection 10 mg, 10 mg, Intravenous, Q4H PRN, Shelly Coss, MD, 10 mg at 12/31/19 0824 .  MEDLINE mouth rinse, 15 mL, Mouth Rinse, BID, Adhikari, Amrit, MD, 15 mL at 12/30/19 2201 .  multivitamin with minerals tablet 1  tablet, 1 tablet, Oral, Daily, Shelly Coss, MD, 1 tablet at 12/30/19 0816 .  nutrition supplement (JUVEN) (JUVEN) powder packet 1 packet, 1 packet, Oral, BID BM, Shelly Coss, MD, 1 packet at 12/30/19 0817 .  polyethylene glycol (MIRALAX / GLYCOLAX) packet 17 g, 17 g, Oral, Daily PRN, Adhikari, Amrit, MD .  potassium chloride SA (KLOR-CON) CR tablet 20 mEq, 20 mEq, Oral, Daily, Adhikari, Amrit, MD, 20 mEq at 12/30/19 0816 .  Racepinephrine HCl 2.25 % nebulizer solution 0.5 mL, 0.5 mL, Nebulization, Once, Adhikari, Amrit, MD .  sodium chloride flush (NS) 0.9 % injection 10-40 mL, 10-40 mL, Intracatheter, Q12H, Adhikari, Amrit, MD, 10 mL at 12/31/19 1031 .  sodium chloride flush (NS) 0.9 % injection 10-40 mL, 10-40 mL, Intracatheter, PRN, Shelly Coss, MD .  Derrill Memo ON 01/01/2020] vancomycin (VANCOCIN) IVPB 1000 mg/200 mL premix, 1,000 mg, Intravenous, Q24H, Angela Adam, [facility]   12/27/2019 - admit Dec 29, 2019: Urology consult May 21st 2021: Infectious disease consult 5.22.21 - ccm consult  Consults:  x  Procedures:  x  Significant Diagnostic Tests:  x  Micro Data:   Recent Results (from the past 720 hour(s))  Blood Culture (routine x 2)     Status: None   Collection Time: 12/08/19  8:14 PM   Specimen: BLOOD LEFT FOREARM  Result Value Ref Range Status   Specimen Description BLOOD LEFT FOREARM  Final   Special Requests   Final    BOTTLES DRAWN AEROBIC ONLY Blood Culture results may not be optimal due to an inadequate volume of blood received in culture bottles Performed at [facility] [address] [place], [place] [address]    Culture   Final    NO GROWTH 5 DAYS Performed at [facility], [place] [address] [place], [place] [address]    Report Status 12/13/2019 FINAL  Final  Urine culture     Status: Abnormal   Collection Time: 12/08/19  8:14 PM   Specimen: In/Out Cath Urine  Result Value Ref Range Status    Specimen Description   Final    IN/OUT CATH URINE Performed at [facility] [address] [place], [place] [address]    Special Requests   Final    NONE Performed at [facility], [place] [address] [place], [place] [address]    Culture >=100,000 COLONIES/mL ESCHERICHIA COLI (A)  Final   Report Status 12/10/2019 FINAL  Final   Organism ID, Bacteria ESCHERICHIA COLI (A)  Final      Susceptibility   Escherichia coli - MIC*    AMPICILLIN >=32 RESISTANT Resistant     CEFAZOLIN >=64 RESISTANT Resistant     CEFTRIAXONE <=1 SENSITIVE Sensitive     CIPROFLOXACIN <=0.25 SENSITIVE Sensitive     GENTAMICIN <=1 SENSITIVE Sensitive     IMIPENEM <=  0.25 SENSITIVE Sensitive     NITROFURANTOIN <=16 SENSITIVE Sensitive     TRIMETH/SULFA <=20 SENSITIVE Sensitive     AMPICILLIN/SULBACTAM >=32 RESISTANT Resistant     PIP/TAZO 8 SENSITIVE Sensitive     * >=100,000 COLONIES/mL ESCHERICHIA COLI  Blood Culture (routine x 2)     Status: None   Collection Time: 12/08/19  8:45 PM   Specimen: BLOOD  Result Value Ref Range Status   Specimen Description   Final    BLOOD RIGHT HAND Performed at [facility] [address] [place], [place] [address]    Special Requests   Final    BOTTLES DRAWN AEROBIC AND ANAEROBIC Blood Culture results may not be optimal due to an excessive volume of blood received in culture bottles Performed at [facility] [address] [place], [place] [address]    Culture   Final    NO GROWTH 5 DAYS Performed at [facility], [place] [address] [place],  [address]    Report Status 12/13/2019 FINAL  Final  Respiratory Panel by RT PCR (Flu A&B, Covid) - Nasopharyngeal Swab     Status: None   Collection Time: 12/08/19 10:00 PM   Specimen: Nasopharyngeal Swab  Result Value Ref Range Status   SARS Coronavirus 2 by RT PCR NEGATIVE NEGATIVE Final    Comment: (NOTE) SARS-CoV-2 target nucleic acids  are NOT DETECTED. The SARS-CoV-2 RNA is generally detectable in upper respiratoy specimens during the acute phase of infection. The lowest concentration of SARS-CoV-2 viral copies this assay can detect is 131 copies/mL. A negative result does not preclude SARS-Cov-2 infection and should not be used as the sole basis for treatment or other patient management decisions. A negative result may occur with  improper specimen collection/handling, submission of specimen other than nasopharyngeal swab, presence of viral mutation(s) within the areas targeted by this assay, and inadequate number of viral copies (<131 copies/mL). A negative result must be combined with clinical observations, patient history, and epidemiological information. The expected result is Negative. Fact Sheet for Patients:  PinkCheek.be Fact Sheet for Healthcare Providers:  GravelBags.it This test is not yet ap proved or cleared by the [place] FDA and  has been authorized for detection and/or diagnosis of SARS-CoV-2 by FDA under an Emergency Use Authorization (EUA). This EUA will remain  in effect (meaning this test can be used) for the duration of the COVID-19 declaration under Section 564(b)(1) of the Act, 21 U.S.C. section 360bbb-3(b)(1), unless the authorization is terminated or revoked sooner.    Influenza A by PCR NEGATIVE NEGATIVE Final   Influenza B by PCR NEGATIVE NEGATIVE Final    Comment: (NOTE) The Xpert Xpress SARS-CoV-2/FLU/RSV assay is intended as an aid in  the diagnosis of influenza from Nasopharyngeal swab specimens and  should not be used as a sole basis for treatment. Nasal washings and  aspirates are unacceptable for Xpert Xpress SARS-CoV-2/FLU/RSV  testing. Fact Sheet for Patients: PinkCheek.be Fact Sheet for Healthcare Providers: GravelBags.it This test is not yet approved or  cleared by the [place] FDA and  has been authorized for detection and/or diagnosis of SARS-CoV-2 by  FDA under an Emergency Use Authorization (EUA). This EUA will remain  in effect (meaning this test can be used) for the duration of the  Covid-19 declaration under Section 564(b)(1) of the Act, 21  U.S.C. section 360bbb-3(b)(1), unless the authorization is  terminated or revoked. Performed at [facility], [place] [address]  [place], [place] [address]   Urine Culture     Status: None   Collection Time: 12/15/19 11:12 AM   Specimen: Urine, Clean Catch  Result Value Ref Range Status   Specimen Description   Final    URINE, CLEAN CATCH Performed at [facility], [place] [address], [place], [place] [address]    Special Requests   Final    NONE Performed at [facility], [place] [address], [place], [place] [address]    Culture   Final    NO GROWTH Performed at [facility], [place] [address], [place], [place] [address]    Report Status 12/16/2019 FINAL  Final  Culture, blood (Routine X 2) w Reflex to ID Panel     Status: None   Collection Time: 12/15/19 11:33 AM   Specimen: BLOOD  Result Value Ref Range Status   Specimen Description   Final    BLOOD RIGHT ANTECUBITAL Performed at [facility] [address] [place], [place] [address]    Special Requests   Final    BOTTLES DRAWN AEROBIC AND ANAEROBIC Blood Culture adequate volume Performed at [facility] [address], [place], [place] [address]    Culture   Final    NO GROWTH 5 DAYS Performed at [facility], [place] [address], [place], [place] [address]    Report Status 12/20/2019 FINAL  Final  Culture, blood (Routine X 2) w Reflex to ID Panel     Status: None   Collection Time: 12/15/19 11:53 AM   Specimen: BLOOD  Result Value Ref Range Status   Specimen Description   Final    BLOOD LEFT ANTECUBITAL Performed at [facility] [address] [place], [place] [address]    Special Requests   Final    BOTTLES DRAWN AEROBIC AND ANAEROBIC Blood Culture adequate volume Performed at [facility] [address], [place], [place] [address]    Culture   Final    NO GROWTH 5 DAYS Performed at [facility], [place] [address] [place], [place] [address]    Report Status 12/20/2019 FINAL  Final  Respiratory Panel by RT PCR (Flu A&B, Covid) - Nasopharyngeal Swab     Status: None   Collection Time: 12/15/19  3:36 PM   Specimen: Nasopharyngeal Swab  Result Value Ref Range Status   SARS Coronavirus 2 by RT PCR NEGATIVE NEGATIVE Final    Comment: (NOTE) SARS-CoV-2 target nucleic acids are NOT DETECTED. The SARS-CoV-2 RNA is generally detectable in upper respiratoy specimens during the acute phase of infection. The lowest concentration of SARS-CoV-2 viral copies this assay can detect is 131 copies/mL. A negative result does not preclude SARS-Cov-2 infection and should not be used as the sole basis for treatment or other patient management decisions. A negative result may occur with  improper specimen collection/handling, submission of specimen other than nasopharyngeal swab, presence of viral mutation(s) within the areas targeted by this assay, and inadequate number of viral copies (<131 copies/mL). A negative result must be combined with clinical observations, patient history, and epidemiological information. The expected result is Negative. Fact Sheet for Patients:  PinkCheek.be Fact Sheet for Healthcare Providers:  GravelBags.it This test is not yet ap proved or cleared by the [place] FDA and  has been authorized for detection and/or diagnosis of SARS-CoV-2 by FDA under an Emergency Use Authorization (EUA). This EUA will remain  in effect (meaning this  test can be used) for the duration of the COVID-19  declaration under Section 564(b)(1) of the Act, 21 U.S.C. section 360bbb-3(b)(1), unless the authorization is terminated or revoked sooner.    Influenza A by PCR NEGATIVE NEGATIVE Final   Influenza B by PCR NEGATIVE NEGATIVE Final    Comment: (NOTE) The Xpert Xpress SARS-CoV-2/FLU/RSV assay is intended as an aid in  the diagnosis of influenza from Nasopharyngeal swab specimens and  should not be used as a sole basis for treatment. Nasal washings and  aspirates are unacceptable for Xpert Xpress SARS-CoV-2/FLU/RSV  testing. Fact Sheet for Patients: PinkCheek.be Fact Sheet for Healthcare Providers: GravelBags.it This test is not yet approved or cleared by the [place] FDA and  has been authorized for detection and/or diagnosis of SARS-CoV-2 by  FDA under an Emergency Use Authorization (EUA). This EUA will remain  in effect (meaning this test can be used) for the duration of the  Covid-19 declaration under Section 564(b)(1) of the Act, 21  U.S.C. section 360bbb-3(b)(1), unless the authorization is  terminated or revoked. Performed at [facility], [place] [address], [place], [place] [address]   SARS Coronavirus 2 by RT PCR (hospital order, performed in [facility] hospital lab) Nasopharyngeal Nasopharyngeal Swab     Status: None   Collection Time: 12/27/19  4:44 AM   Specimen: Nasopharyngeal Swab  Result Value Ref Range Status   SARS Coronavirus 2 NEGATIVE NEGATIVE Final    Comment: (NOTE) SARS-CoV-2 target nucleic acids are NOT DETECTED. The SARS-CoV-2 RNA is generally detectable in upper and lower respiratory specimens during the acute phase of infection. The lowest concentration of SARS-CoV-2 viral copies this assay can detect is 250 copies / mL. A negative result does not preclude SARS-CoV-2 infection and should not be used as the sole basis for treatment or other patient management decisions.  A  negative result may occur with improper specimen collection / handling, submission of specimen other than nasopharyngeal swab, presence of viral mutation(s) within the areas targeted by this assay, and inadequate number of viral copies (<250 copies / mL). A negative result must be combined with clinical observations, patient history, and epidemiological information. Fact Sheet for Patients:   StrictlyIdeas.no Fact Sheet for Healthcare Providers: BankingDealers.co.za This test is not yet approved or cleared  by the [place] FDA and has been authorized for detection and/or diagnosis of SARS-CoV-2 by FDA under an Emergency Use Authorization (EUA).  This EUA will remain in effect (meaning this test can be used) for the duration of the COVID-19 declaration under Section 564(b)(1) of the Act, 21 U.S.C. section 360bbb-3(b)(1), unless the authorization is terminated or revoked sooner. Performed at [facility], [place] [address] [place], [place] [address]   Urine culture     Status: None   Collection Time: 12/27/19  4:44 AM   Specimen: Urine, Clean Catch  Result Value Ref Range Status   Specimen Description   Final    URINE, CLEAN CATCH Performed at [facility], [place] [address], [place], [place] [address]    Special Requests   Final    NONE Performed at [facility], [place] [address], [place], [place] [address]    Culture   Final    NO GROWTH Performed at [facility], [place] [address] [place], [place] [address]    Report Status 12/28/2019 FINAL  Final  Blood culture (routine x 2)     Status: None (Preliminary result)  Collection Time: 12/27/19  7:01 AM   Specimen: BLOOD  Result Value Ref Range Status   Specimen Description   Final    BLOOD RIGHT ANTECUBITAL Performed at [facility] [address] [place], [place] [address]    Special Requests    Final    BOTTLES DRAWN AEROBIC AND ANAEROBIC Blood Culture adequate volume Performed at [facility] [address], [place], [place] [address]    Culture   Final    NO GROWTH 4 DAYS Performed at [facility], [place] [address] [place], [place] [address]    Report Status PENDING  Incomplete  Blood culture (routine x 2)     Status: None (Preliminary result)   Collection Time: 12/27/19  7:27 AM   Specimen: BLOOD  Result Value Ref Range Status   Specimen Description   Final    BLOOD LEFT ANTECUBITAL Performed at [facility] [address] [place], [place] [address]    Special Requests   Final    BOTTLES DRAWN AEROBIC AND ANAEROBIC Blood Culture adequate volume Performed at [facility] [address] [place], [place] [address]    Culture   Final    NO GROWTH 4 DAYS Performed at [facility], [place] [address], [place], [place] [address]    Report Status PENDING  Incomplete  Culture, blood (routine x 2)     Status: None (Preliminary result)   Collection Time: 12/29/19 10:06 AM   Specimen: BLOOD  Result Value Ref Range Status   Specimen Description   Final    BLOOD RIGHT ARM Performed at [facility] [address], [place], [place] [address]    Special Requests   Final    BOTTLES DRAWN AEROBIC AND ANAEROBIC Blood Culture adequate volume Performed at [facility] [address], [place], [place] [address]    Culture   Final    NO GROWTH 2 DAYS Performed at [facility] [address], [place], [place] [address]    Report Status PENDING  Incomplete  Culture, blood (routine x 2)     Status: None (Preliminary result)   Collection Time: 12/29/19 10:07 AM   Specimen: BLOOD  Result Value Ref Range Status   Specimen Description   Final    BLOOD RIGHT ARM Performed at [facility] [address] [place], [place] [address]    Special Requests    Final    BOTTLES DRAWN AEROBIC AND ANAEROBIC Blood Culture adequate volume Performed at [facility] [address], [place], [place] [address]    Culture   Final    NO GROWTH 2 DAYS Performed at [facility] [address], [place], [place] [address]    Report Status PENDING  Incomplete  MRSA PCR Screening     Status: None   Collection Time: 12/30/19  1:14 PM   Specimen: Nasal Mucosa; Nasopharyngeal  Result Value Ref Range Status   MRSA by PCR NEGATIVE NEGATIVE Final    Comment:        The GeneXpert MRSA Assay (FDA approved for NASAL specimens only), is one component of a comprehensive MRSA colonization surveillance program. It is not intended to diagnose MRSA infection nor to guide or monitor treatment for MRSA infections. Performed at [facility], [place] [address] [place], [place] [address]      Antimicrobials:   Anti-infectives (From admission, onward)  Start     Dose/Rate Route Frequency Ordered Stop   01/01/20 0400  vancomycin (VANCOCIN) IVPB 1000 mg/200 mL premix     1,000 mg 200 mL/hr over 60 Minutes Intravenous Every 24 hours 12/31/19 0920     12/30/19 0400  vancomycin (VANCOREADY) IVPB 750 mg/150 mL  Status:  Discontinued     750 mg 150 mL/hr over 60 Minutes Intravenous Every 12 hours 12/29/19 1340 12/31/19 0920   12/29/19 1430  ceFEPIme (MAXIPIME) 2 g in sodium chloride 0.9 % 100 mL IVPB     2 g 200 mL/hr over 30 Minutes Intravenous Every 8 hours 12/29/19 1340     12/29/19 1400  vancomycin (VANCOREADY) IVPB 2000 mg/400 mL     2,000 mg 200 mL/hr over 120 Minutes Intravenous  Once 12/29/19 1340 12/29/19 1838   12/27/19 0730  cefTRIAXone (ROCEPHIN) 1 g in sodium chloride 0.9 % 100 mL IVPB  Status:  Discontinued     1 g 200 mL/hr over 30 Minutes Intravenous Every 24 hours 12/27/19 0714 12/29/19 1239      MEDS   Scheduled Meds: . allopurinol  200 mg Oral QPM  . aspirin EC  81 mg Oral QHS  . atorvastatin  40  mg Oral Daily  . carvedilol  12.5 mg Oral BID WC  . Chlorhexidine Gluconate Cloth  6 each Topical Daily  . famotidine  20 mg Oral BID  . feeding supplement (PRO-STAT SUGAR FREE 64)  30 mL Oral BID  . fluticasone  2 spray Each Nare Daily  . furosemide      . heparin  5,000 Units Subcutaneous Q8H  . mouth rinse  15 mL Mouth Rinse BID  . multivitamin with minerals  1 tablet Oral Daily  . nutrition supplement (JUVEN)  1 packet Oral BID BM  . potassium chloride SA  20 mEq Oral Daily  . Racepinephrine HCl  0.5 mL Nebulization Once  . sodium chloride flush  10-40 mL Intracatheter Q12H   Continuous Infusions: . sodium chloride Stopped (12/30/19 1700)  . sodium chloride 10 mL/hr at 12/31/19 0904  . ceFEPime (MAXIPIME) IV Stopped (12/31/19 0618)  . dexmedetomidine (PRECEDEX) IV infusion 0.4 mcg/kg/hr (12/31/19 0903)  . [START ON 01/01/2020] vancomycin     PRN Meds:.sodium chloride, acetaminophen, acetaminophen, albuterol, hydrALAZINE, polyethylene glycol, sodium chloride flush      Interim history/subjective:  X   Objective   Blood pressure (!) 180/78, pulse 84, temperature (!) 101.9 F (38.8 C), temperature source Axillary, resp. rate (!) 25, height 5\' 6"  (1.676 m), weight 108.3 kg, SpO2 95 %.    FiO2 (%):  [28 %] 28 %   Intake/Output Summary (Last 24 hours) at 12/31/2019 1152 Last data filed at 12/31/2019 0500 Gross per 24 hour  Intake 1526.8 ml  Output 1050 ml  Net 476.8 ml   Filed Weights   12/26/19 2321 12/27/19 1254  Weight: 110.2 kg 108.3 kg     Examination: Initially morbidly obese male with significant visceral obesity and chronic venous stasis edema with significant respiratory distress respiratory rate of 30s to 40s.  Also hypoxemic.  And confused.  Seems stridorous although this could be faint expiratory wheezing did not respond to racemic epi. ->  Later started on Precedex given Lasix and put on BiPAP.  At this point in time there is no wheezing and RASS sedation  score -2 and respiratory 20 and appears calm.  Not paradoxical.   Resolved Hospital Problem list   x  Assessment & Plan:  Chronic OSA-has been refusing CPAP in the floor Acute respiratory failure hypoxemia -could be early ARDS but also could be acute on chronic systolic/diastolic heart failure.  Seem to respond to BiPAP and Lasix.  Doubt pulmonary embolism in the setting of heparin for DVT prophylaxis  Plan -BiPAP continuous and then later as he gets better weaned to BiPAP nightly mandated less daytime as needed -Lasix x1 -Cut down fluids/make KVO or saline lock -Intubate if gets worse -Get Doppler lower extremity  Previous history of nonischemic systolic heart failure.  March 2021 echo with diastolic dysfunction and normal systolic function  0000000: Behaving like volume overload/acute on chronic diastolic dysfunction  Plan -Check troponin -Check echo -check bnp = meds - asa, cored, lipitor per primary service   Persistent sepsis with fevers in the setting of E. coli multidrug-resistant UTI end of April 2021 and right pyelonephritis.  Urology concerned about abscess and right renal cyst.  Infectious disease following  12/31/2019: Ongoing fevers  Plan -Antibiotics per primary service and infectious diseases    Best practice:  Diet: N.p.o. Pain/Anxiety/Delirium protocol (if indicated): precedex VAP protocol (if indicated): hob DVT prophylaxis: heparin sq GI prophylaxis: protonix iv Glucose control: ssi Mobility: bed rest Code Status: full code Family Communication: per triad Disposition: ICU .  If she is on Precedex by Jan 01, 2020: Critical care will be primary.     ATTESTATION & SIGNATURE   The patient [place] is critically ill with multiple organ systems failure and requires high complexity decision making for assessment and support, frequent evaluation and titration of therapies, application of advanced monitoring technologies and extensive  interpretation of multiple databases.   Critical Care Time devoted to patient care services described in this note is  60  Minutes. This time reflects time of care of this signee Dr Brand Males. This critical care time does not reflect procedure time, or teaching time or supervisory time of PA/NP/Med student/Med Resident etc but could involve care discussion time     Dr. Brand Males, M.D., [facility].C.P Pulmonary and Critical Care Medicine Staff Physician [facility] Pager: [phone], If no answer or between  15:00h - 7:00h: call [phone]  12/31/2019 11:52 AM    LABS    PULMONARY Recent Labs  Lab 12/31/19 0908  PHART 7.430  PCO2ART 30.4*  PO2ART 93.3  HCO3 19.8*  O2SAT 96.3    CBC Recent Labs  Lab 12/29/19 0526 12/30/19 0512 12/31/19 0230  HGB 10.0* 9.7* 9.4*  HCT 31.1* 30.1* 30.3*  WBC 12.8* 16.8* 15.8*  PLT 256 267 278    COAGULATION Recent Labs  Lab 12/30/19 1106  INR 1.2    CARDIAC  No results for input(s): TROPONINI in the last 168 hours. No results for input(s): PROBNP in the last 168 hours.   CHEMISTRY Recent Labs  Lab 12/26/19 2321 12/26/19 2321 12/28/19 0440 12/28/19 0440 12/29/19 0526 12/29/19 0526 12/30/19 1106 12/31/19 0230  NA 140  --  141  --  138  --  140 142  K 4.3   < > 4.1   < > 4.5   < > 4.2 3.9  CL 98  --  103  --  104  --  105 110  CO2 29  --  28  --  25  --  24 23  GLUCOSE 182*  --  116*  --  91  --  113* 100*  BUN 21  --  14  --  12  --  17 18  CREATININE 1.83*  --  1.56*  --  1.48*  --  1.51* 1.65*  CALCIUM 8.2*  --  7.9*  --  8.0*  --  8.2* 8.1*   < > = values in this interval not displayed.   Estimated Creatinine Clearance: 52.2 mL/min (A) (by C-G formula based on SCr of 1.65 mg/dL (H)).   LIVER Recent Labs  Lab 12/30/19 1106  INR 1.2     INFECTIOUS Recent Labs  Lab 12/27/19 0730 12/27/19 1008  LATICACIDVEN 2.5* 1.1     ENDOCRINE CBG (last  3)  Recent Labs    12/29/19 1201  GLUCAP 109*         IMAGING x48h  - image(s) personally visualized  -   highlighted in bold DG Chest 1 View  Result Date: 12/29/2019 CLINICAL DATA:  Shortness of breath EXAM: CHEST  1 VIEW COMPARISON:  Dec 27, 2019 FINDINGS: There is mild atelectatic change in the right lower lung region. The lungs elsewhere are clear. There is cardiomegaly with pulmonary vascularity normal. No adenopathy. No bone lesions. IMPRESSION: Mild right lower lung region atelectasis. Lungs elsewhere clear. Stable cardiomegaly. Electronically Signed   By: Lowella Grip III M.D.   On: 12/29/2019 13:42   MR BRAIN WO CONTRAST  Result Date: 12/30/2019 CLINICAL DATA:  Encephalopathy.  Sepsis. EXAM: MRI HEAD WITHOUT CONTRAST TECHNIQUE: Multiplanar, multiecho pulse sequences of the brain and surrounding structures were obtained without intravenous contrast. COMPARISON:  Head CT 12/27/2019 FINDINGS: Brain: Diffusion imaging does not show any acute or subacute infarction or other cause of restricted diffusion. No focal abnormality affects the brainstem or cerebellum. Cerebral hemispheres show minimal small vessel change of the white matter. No cortical or large vessel territory infarction. No mass lesion, hemorrhage, hydrocephalus or extra-axial collection. Vascular: Major vessels at the base of the brain show flow. Skull and upper cervical spine: Negative Sinuses/Orbits: Opacification of the left maxillary sinus, the sphenoid sinus and some of the left ethmoid sinuses. Internal material is hypointense on T2 imaging. This could be chronic inspissated mucus or chronic fungal sinusitis. Other: None IMPRESSION: No significant intracranial finding. No acute infarction or other acute process. Sinus disease primarily affecting the left maxillary and sphenoid sinus. The sinuses could contain chronic inspissated mucus or could be affected by fungal sinusitis. Electronically Signed   By: Nelson Chimes  M.D.   On: 12/30/2019 15:34   DG CHEST PORT 1 VIEW  Result Date: 12/31/2019 CLINICAL DATA:  64 year old male with a history of shortness of breath EXAM: PORTABLE CHEST 1 VIEW COMPARISON:  12/29/2019 FINDINGS: Cardiomediastinal silhouette unchanged with cardiomegaly. Interlobular septal thickening. Mild patchy airspace opacity in the lower lungs slightly increased from the comparison. No pneumothorax or pleural effusion. IMPRESSION: Evidence of early pulmonary edema and basilar atelectasis. Electronically Signed   By: Corrie Mckusick D.O.   On: 12/31/2019 09:43

## 2019-12-31 NOTE — Progress Notes (Signed)
Lab review  Results for Marco [name], Marco [name] (MRN [id number]) as of 12/31/2019 18:07  Ref. Range 12/30/2019 11:06 12/31/2019 02:30 12/31/2019 09:08 12/31/2019 12:02 12/31/2019 12:03  B Natriuretic Peptide Latest Ref Range: 0.0 - 100.0 pg/mL    1,407.8 (H)   Troponin I (High Sensitivity) Latest Ref Range: <18 ng/L     184 (HH)  Lactic Acid, Venous Latest Ref Range: 0.5 - 1.9 mmol/L     1.0  Procalcitonin Latest Units: ng/mL     0.30    Above c/w CHF acute nos  Pl;an Repeat lasix KCL 13meq x 1 9(rising creat)    SIGNATURE    Dr. Brand Males, M.D., F.C.C.P,  Pulmonary and Critical Care Medicine Staff Physician, [facility] Director - Interstitial Lung Disease  Program  Pulmonary [place] [facility] [place], [place], [address]  Pager: [phone], If no answer or between  15:00h - 7:00h: call [phone] Telephone: [phone]  6:08 PM 12/31/2019

## 2019-12-31 NOTE — Progress Notes (Signed)
Request to IR for right renal cyst aspiration - patient history and imaging reviewed by IR attending (Dr. Anselm Pancoast) today who states that this appears to be consistent with a hemorrhagic cyst and would not proceed with aspiration at this time.  IR will follow chart for now.  Please call Dr. Anselm Pancoast with any questions or concerns [phone]    Candiss Norse, [name]

## 2019-12-31 NOTE — Progress Notes (Signed)
Rt placed pt on  BIPAP in ICU for AMS/respiratory distress.

## 2019-12-31 NOTE — Progress Notes (Signed)
Pharmacy Antibiotic Note  Marco [name] is a 64 y.o. male admitted on 12/27/2019 with sepsis.  Pharmacy has been consulted for vancomycin and cefepime dosing.  12/31/2019 Scr 1.65, CrCl ~ 52.72mls/min  Plan:  Decrease Vancomycin to 1000mg  IV q24h (AUC 482.5)  Will keep cefepime at 2gm IV q8h for now  Monitor clinical course, renal function, cultures as available    Height: 5\' 6"  (167.6 cm) Weight: 108.3 kg (238 lb 12.1 oz) IBW/kg (Calculated) : 63.8  Temp (24hrs), Avg:101.3 F (38.5 C), Min:98.9 F (37.2 C), Max:102.6 F (39.2 C)  Recent Labs  Lab 12/26/19 2321 12/27/19 0730 12/27/19 1008 12/28/19 0440 12/29/19 0526 12/30/19 0512 12/30/19 1106 12/31/19 0230  WBC 13.3*  --   --  12.2* 12.8* 16.8*  --  15.8*  CREATININE 1.83*  --   --  1.56* 1.48*  --  1.51* 1.65*  LATICACIDVEN  --  2.5* 1.1  --   --   --   --   --     Estimated Creatinine Clearance: 52.2 mL/min (A) (by C-G formula based on SCr of 1.65 mg/dL (H)).    Allergies  Allergen Reactions  . Nsaids Other (See Comments)    Stomach pains. Ulcers - stated by patient     Antimicrobials this admission: 5/18 ceftriaxone >> 5/20 5/20 cefepime >>  5/20 vancomycin >>   Dose adjustments this admission: 5/22 decrease vanc to 1gm q24h  Microbiology results: 5/18 BCx: NGTD 5/18 UCx: NGF  5/20 BCx:   Thank you for allowing pharmacy to be a part of this patient's care.    Dolly Rias RPh 12/31/2019, 9:28 AM

## 2019-12-31 NOTE — Progress Notes (Signed)
PROGRESS NOTE    Marco [name]  [id number] DOB: May 11, 1956 DOA: 12/27/2019 PCP: [name], MD   Brief Narrative:  Marco [name] is a 64 y.o. male with medical history significant of combined congestive heart failure, nonischemic cardiomyopathy, CKD stage III, diabetes type 2, coronary artery disease, hypertension, obesity, OSA A. fib status post ablation in 2017, psoriatic arthritis on daily prednisone therapy who presents to the emergency department complaints of weakness, lethargic, falls at home.  He was recently admitted and was discharged earlier this month after being managed for  pyelonephritis.  Patient was noticing increasing weakness after discharge to home.  He also fell multiple times at home.  He also reported continuous urinary frequency, dysuria and incontinence Patient was admitted for the suspicion of recurrent sepsis/urinary tract infection/prostatitis.  Urine culture did not show any growth. Blood cultures have not shown any growth.  Repeat blood cultures have been sent,NGTD.  Patient has persistent fever , leukocytosis.   Antibiotics have been  broadened to vancomycin and Zosyn.  Urology consulted and following.  ID also following.  No clear source of infection has been found.  LP attempted but unsuccessful. Patient transferred to stepdown for close monitoring on 12/30/19. This morning, he desaturated, became tachypneic and had to be put on 100% nonrebreather. PCCM consulted and now taking over. Started on Precedex  Assessment & Plan:   Principal Problem:   Fever Active Problems:   CAD (coronary artery disease)   Atrial fibrillation (HCC)   Essential hypertension   Chronic kidney disease   Morbid obesity due to excess calories (HCC)   Chronic pain   Type 2 diabetes mellitus with stage 3 chronic kidney disease, without long-term current use of insulin (HCC)   OSA (obstructive sleep apnea)   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HCC)   Diabetic polyneuropathy associated with  type 2 diabetes mellitus (HCC)   Chronic combined systolic and diastolic CHF (congestive heart failure) ([place])   Hyperlipidemia   Urethral stricture   Nephrolithiasis   Sepsis: Presented with fever, lactic acidosis, lethargy.    Currently hemodynamically stable. He still has mild grade fever.Blood cultures have not shown any growth yet.  Antibiotics broadened to vancomycin and Zosyn.  ID consulted and following.  Recurrent UTI/Suspected prostatitis from recent UTI/persistent dysuria: Presented with complaints of dysuria, frequency.  He was recently admitted here for the management of sepsis/pyelonephritis and was treated with IV antibiotics and was discharged.  CT abdomen at that time suggested right-sided pyelonephritis.  Urine culture + for  E. coli at that time.  Ultrasound imaging of pelvis did not show any prostatitis or cystitis.   UA was not reassuring.Urine culture did not show any growth Started on ceftriaxone,but now broadened to vancomycin and cefepime because he still has fever.  Started on Pyridium for dysuria. CT abdomen/pelvis without contrast showed stable 3.9 cm x 2.9 cm exophytic isodense area along the posterior aspect of the upper pole of the right kidney with moderate to marked amount of surrounding inflammatory fat strandingy representing  a hemorrhagic cyst, with associated right-sided pyelonephritis. Urology has been consulted and following.  Urology recommended to repeat CT abdomen/pelvis with contrast in 72 hours  to follow-up on right-sided hemorrhagic cyst if he continues to be febrile. We will consider repeat CT abdomen/pelvis without contrast tomorrow if he continues to be febrile. Urology also recommended IR guided drainage of right kidney hemorrhagic cyst but IR declined aspiration.  Acute hypoxic respiratory failure: This morning he became tachypneic, desaturated. Chest x-ray showed early pulmonary  edema, bibasilar atelectasis. Improved with a dose of Lasix.  Currently on BiPAP. PCCM following.  Altered mental status: New problem.  He became  more confused and lethargic since 12/30/19.  MRI of the brain did not show any acute intracranial abnormalities.  Suspicion for meningitis but he could not tolerate LP.  He is already on antibiotics.  AKI on CKD stage IIIb: Baseline creatinine of 1.5. Creatinine  elevated slightly  from baseline .  Started on gentle IV fluids due to poor oral intake.  Generalized weakness: From deconditioning, sepsis,urinary tract infection.  Continue supportive care.physical therapy assessment done and recommended HHPT  Diabetes type 2: Takes insulin at home.  Continue current insulin regimen.  Monitor CBGs.  Chronic combined systolic/diastolic congestive heart failure: Last echo done on 10/2019 showed ejection fraction of 55 to 60%.  He is on Coreg, Entresto, Lasix at home which are on hold Echocardiogram done here today showed ejection fraction of 50 to [id number], diastolic dysfunction.  hypertension: On Coreg, Imdur, hydralazine, Entresto, Lasix at home.    Hypertensive  this morning Home medications restarted.    Neuropathy: Takes Lyrica at home which has been held due to generalized weakness  Psoriatic arthritis: Takes prednisone 5 mg at home which we will continue.Also on monthly Cosyntex.  Normocytic anemia: Most likely associated with CKD.  Hemoglobin stable in the range of 10.  Morbid obesity: BMI of 39.2.  OSA: On cpap at home.On bipap now  Nutrition Problem: Increased nutrient needs Etiology: acute illness, wound healing      DVT prophylaxis: Heparin [place] Code Status: Full Family Communication: Discussing with wife on daily basis.  Status is: Inpatient  Remains inpatient appropriate because:Inpatient level of care appropriate due to severity of illness   Dispo: The patient is from: Home              Anticipated d/c is to: Home              Anticipated d/c date is: 3 days              Patient  currently is not medically stable to d/c.   Consultants: PCCM  Procedures:None  Antimicrobials:  Anti-infectives (From admission, onward)   Start     Dose/Rate Route Frequency Ordered Stop   12/30/19 0400  vancomycin (VANCOREADY) IVPB 750 mg/150 mL     750 mg 150 mL/hr over 60 Minutes Intravenous Every 12 hours 12/29/19 1340     12/29/19 1430  ceFEPIme (MAXIPIME) 2 g in sodium chloride 0.9 % 100 mL IVPB     2 g 200 mL/hr over 30 Minutes Intravenous Every 8 hours 12/29/19 1340     12/29/19 1400  vancomycin (VANCOREADY) IVPB 2000 mg/400 mL     2,000 mg 200 mL/hr over 120 Minutes Intravenous  Once 12/29/19 1340 12/29/19 1838   12/27/19 0730  cefTRIAXone (ROCEPHIN) 1 g in sodium chloride 0.9 % 100 mL IVPB  Status:  Discontinued     1 g 200 mL/hr over 30 Minutes Intravenous Every 24 hours 12/27/19 0714 12/29/19 1239      Subjective: Patient seen and examined at the bedside this morning. Went into acute hypoxic respiratory failure and had to be put on BiPAP. Very lethargic, tachypneic. PCCM taking over. I called the wife for update but phone not received.  Objective: Vitals:   12/31/19 0500 12/31/19 0600 12/31/19 0700 12/31/19 0724  BP: (!) 139/58 (!) 165/71 (!) 145/59   Pulse: 80 97 87 85  Resp: [name])  27 (!) 24 (!) 27 (!) 28  Temp: (!) 100.6 F (38.1 C)     TempSrc: Axillary     SpO2: 91% (!) 89% 90% 91%  Weight:      Height:        Intake/Output Summary (Last 24 hours) at 12/31/2019 0735 Last data filed at 12/31/2019 0500 Gross per 24 hour  Intake 1526.8 ml  Output 1050 ml  Net 476.8 ml   Filed Weights   12/26/19 2321 12/27/19 1254  Weight: 110.2 kg 108.3 kg    Examination:   General exam: Lethargic, in respiratory distress Respiratory system: Bilateral crackles, tachypnea  cardiovascular system: Sinus tachycardia Gastrointestinal system: Abdomen is nondistended, soft and nontender. No organomegaly or masses felt. Normal bowel sounds heard. Central nervous system:  Not alert or oriented. Extremities: Trace bilateral lower extremity edema, no clubbing ,no cyanosis Skin: No rashes, lesions or ulcers,no icterus ,no pallor   Data Reviewed: I have personally reviewed following labs and imaging studies  CBC: Recent Labs  Lab 12/26/19 2321 12/28/19 0440 12/29/19 0526 12/30/19 0512 12/31/19 0230  WBC 13.3* 12.2* 12.8* 16.8* 15.8*  NEUTROABS  --   --  8.4* 12.7* 11.7*  HGB 10.8* 10.5* 10.0* 9.7* 9.4*  HCT 35.3* 34.4* 31.1* 30.1* 30.3*  MCV 94.9 96.1 90.7 90.4 92.9  PLT 248 235 256 267 0000000   Basic Metabolic Panel: Recent Labs  Lab 12/26/19 2321 12/28/19 0440 12/29/19 0526 12/30/19 1106 12/31/19 0230  NA 140 141 138 140 142  K 4.3 4.1 4.5 4.2 3.9  CL 98 103 104 105 110  CO2 29 28 25 24 23   GLUCOSE 182* 116* 91 113* 100*  BUN 21 14 12 17 18   CREATININE 1.83* 1.56* 1.48* 1.51* 1.65*  CALCIUM 8.2* 7.9* 8.0* 8.2* 8.1*   GFR: Estimated Creatinine Clearance: 52.2 mL/min (A) (by C-G formula based on SCr of 1.65 mg/dL (H)). Liver Function Tests: No results for input(s): AST, ALT, ALKPHOS, BILITOT, PROT, ALBUMIN in the last 168 hours. No results for input(s): LIPASE, AMYLASE in the last 168 hours. No results for input(s): AMMONIA in the last 168 hours. Coagulation Profile: Recent Labs  Lab 12/30/19 1106  INR 1.2   Cardiac Enzymes: No results for input(s): CKTOTAL, CKMB, CKMBINDEX, TROPONINI in the last 168 hours. BNP (last 3 results) No results for input(s): PROBNP in the last 8760 hours. HbA1C: No results for input(s): HGBA1C in the last 72 hours. CBG: Recent Labs  Lab 12/26/19 2350 12/27/19 1013 12/29/19 1201  GLUCAP 159* 117* 109*   Lipid Profile: No results for input(s): CHOL, HDL, LDLCALC, TRIG, CHOLHDL, LDLDIRECT in the last 72 hours. Thyroid Function Tests: No results for input(s): TSH, T4TOTAL, FREET4, T3FREE, THYROIDAB in the last 72 hours. Anemia Panel: No results for input(s): VITAMINB12, FOLATE, FERRITIN, TIBC,  IRON, RETICCTPCT in the last 72 hours. Sepsis Labs: Recent Labs  Lab 12/27/19 0730 12/27/19 1008  LATICACIDVEN 2.5* 1.1    Recent Results (from the past 240 hour(s))  SARS Coronavirus 2 by RT PCR (hospital order, performed in [facility] hospital lab) Nasopharyngeal Nasopharyngeal Swab     Status: None   Collection Time: 12/27/19  4:44 AM   Specimen: Nasopharyngeal Swab  Result Value Ref Range Status   SARS Coronavirus 2 NEGATIVE NEGATIVE Final    Comment: (NOTE) SARS-CoV-2 target nucleic acids are NOT DETECTED. The SARS-CoV-2 RNA is generally detectable in upper and lower respiratory specimens during the acute phase of infection. The lowest concentration of SARS-CoV-2 viral copies  this assay can detect is 250 copies / mL. A negative result does not preclude SARS-CoV-2 infection and should not be used as the sole basis for treatment or other patient management decisions.  A negative result may occur with improper specimen collection / handling, submission of specimen other than nasopharyngeal swab, presence of viral mutation(s) within the areas targeted by this assay, and inadequate number of viral copies (<250 copies / mL). A negative result must be combined with clinical observations, patient history, and epidemiological information. Fact Sheet for Patients:   StrictlyIdeas.no Fact Sheet for Healthcare Providers: BankingDealers.co.za This test is not yet approved or cleared  by the [place] FDA and has been authorized for detection and/or diagnosis of SARS-CoV-2 by FDA under an Emergency Use Authorization (EUA).  This EUA will remain in effect (meaning this test can be used) for the duration of the COVID-19 declaration under Section 564(b)(1) of the Act, 21 U.S.C. section 360bbb-3(b)(1), unless the authorization is terminated or revoked sooner. Performed at [facility], [place] [address] [place],  [place] [address]   Urine culture     Status: None   Collection Time: 12/27/19  4:44 AM   Specimen: Urine, Clean Catch  Result Value Ref Range Status   Specimen Description   Final    URINE, CLEAN CATCH Performed at [facility], [place] [address] [place], [place] [address]    Special Requests   Final    NONE Performed at [facility], [place] [address] [place], [place] [address]    Culture   Final    NO GROWTH Performed at [facility], [place] [address], [place], [place] [address]    Report Status 12/28/2019 FINAL  Final  Blood culture (routine x 2)     Status: None (Preliminary result)   Collection Time: 12/27/19  7:01 AM   Specimen: BLOOD  Result Value Ref Range Status   Specimen Description   Final    BLOOD RIGHT ANTECUBITAL Performed at [place] [address] [place], [place] [address]    Special Requests   Final    BOTTLES DRAWN AEROBIC AND ANAEROBIC Blood Culture adequate volume Performed at [place] [address] [place], [place] [address]    Culture   Final    NO GROWTH 3 DAYS Performed at [facility], [place] [address], [place], [place] [address]    Report Status PENDING  Incomplete  Blood culture (routine x 2)     Status: None (Preliminary result)   Collection Time: 12/27/19  7:27 AM   Specimen: BLOOD  Result Value Ref Range Status   Specimen Description   Final    BLOOD LEFT ANTECUBITAL Performed at [place] [address], [place], [place] [address]    Special Requests   Final    BOTTLES DRAWN AEROBIC AND ANAEROBIC Blood Culture adequate volume Performed at [place] [address] [place], [place] [address]    Culture   Final    NO GROWTH 3 DAYS Performed at [facility], [place] [address], [place], [place] [address]    Report Status PENDING  Incomplete  Culture, blood (routine x 2)     Status: None (Preliminary  result)   Collection Time: 12/29/19 10:06 AM   Specimen: BLOOD  Result Value Ref Range Status   Specimen Description   Final    BLOOD RIGHT ARM Performed at [facility], [place] [address], [place], [place] [address]    Special Requests   Final    BOTTLES DRAWN AEROBIC AND ANAEROBIC Blood Culture adequate volume Performed at [place] [address] [place], [place] [address]    Culture   Final    NO GROWTH < 24 HOURS Performed at [place] [address], [place], [place] [address]    Report Status PENDING  Incomplete  Culture, blood (routine x 2)     Status: None (Preliminary result)   Collection Time: 12/29/19 10:07 AM   Specimen: BLOOD  Result Value Ref Range Status   Specimen Description   Final    BLOOD RIGHT ARM Performed at [place] [address], [place], [place] [address]    Special Requests   Final    BOTTLES DRAWN AEROBIC AND ANAEROBIC Blood Culture adequate volume Performed at [place] [address] [place], [place] [address]    Culture   Final    NO GROWTH < 24 HOURS Performed at [place] [address], [place], [place] [address]    Report Status PENDING  Incomplete  MRSA PCR Screening     Status: None   Collection Time: 12/30/19  1:14 PM   Specimen: Nasal Mucosa; Nasopharyngeal  Result Value Ref Range Status   MRSA by PCR NEGATIVE NEGATIVE Final    Comment:        The GeneXpert MRSA Assay (FDA approved for NASAL specimens only), is one component of a comprehensive MRSA colonization surveillance program. It is not intended to diagnose MRSA infection nor to guide or monitor treatment for MRSA infections. Performed at [facility], [place] [address] [place], [place] [address]          Radiology Studies: DG Chest 1 View  Result Date: 12/29/2019 CLINICAL DATA:  Shortness of breath EXAM: CHEST  1 VIEW COMPARISON:  Dec 27, 2019 FINDINGS: There is mild atelectatic change in the right lower lung region. The lungs elsewhere are clear. There is cardiomegaly with pulmonary vascularity normal. No adenopathy. No bone lesions. IMPRESSION: Mild right lower lung region atelectasis. Lungs elsewhere clear. Stable cardiomegaly. Electronically Signed   By: Lowella Grip III M.D.   On: 12/29/2019 13:42   MR BRAIN WO CONTRAST  Result Date: 12/30/2019 CLINICAL DATA:  Encephalopathy.  Sepsis. EXAM: MRI HEAD WITHOUT CONTRAST TECHNIQUE: Multiplanar, multiecho pulse sequences of the brain and surrounding structures were obtained without intravenous contrast. COMPARISON:  Head CT 12/27/2019 FINDINGS: Brain: Diffusion imaging does not show any acute or subacute infarction or other cause of restricted diffusion. No focal abnormality affects the brainstem or cerebellum. Cerebral hemispheres show minimal small vessel change of the white matter. No cortical or large vessel territory infarction. No mass lesion, hemorrhage, hydrocephalus or extra-axial collection. Vascular: Major vessels at the base of the brain show flow. Skull and upper cervical spine: Negative Sinuses/Orbits: Opacification of the left maxillary sinus, the sphenoid sinus and some of the left ethmoid sinuses. Internal material is hypointense on T2 imaging. This could be chronic inspissated mucus or chronic fungal sinusitis. Other: None IMPRESSION: No significant intracranial finding. No acute infarction or other acute process. Sinus disease primarily affecting the left maxillary and sphenoid sinus. The sinuses could contain chronic inspissated mucus or could be affected by fungal sinusitis. Electronically Signed   By: Nelson Chimes M.D.   On: 12/30/2019 15:34        Scheduled  Meds: . allopurinol  200 mg Oral QPM  . aspirin EC  81 mg Oral QHS  . atorvastatin  40 mg Oral Daily  . carvedilol  12.5 mg Oral BID WC  . Chlorhexidine Gluconate Cloth  6 each Topical Daily  .  famotidine  20 mg Oral BID  . feeding supplement (ENSURE ENLIVE)  237 mL Oral BID BM  . feeding supplement (PRO-STAT SUGAR FREE 64)  30 mL Oral BID  . fluticasone  2 spray Each Nare Daily  . heparin  5,000 Units Subcutaneous Q8H  . mouth rinse  15 mL Mouth Rinse BID  . multivitamin with minerals  1 tablet Oral Daily  . nutrition supplement (JUVEN)  1 packet Oral BID BM  . potassium chloride SA  20 mEq Oral Daily  . sodium chloride flush  10-40 mL Intracatheter Q12H   Continuous Infusions: . sodium chloride Stopped (12/30/19 1700)  . sodium chloride 75 mL/hr at 12/30/19 1055  . ceFEPime (MAXIPIME) IV Stopped (12/31/19 0618)  . vancomycin Stopped (12/31/19 0451)     LOS: 4 days    Time spent: 35 mins.More than 50% of that time was spent in counseling and/or coordination of care.      Shelly Coss, MD [facility] P5/22/2021, 7:35 AM

## 2019-12-31 NOTE — Progress Notes (Signed)
  Echocardiogram 2D Echocardiogram has been performed.  Jannett Celestine 12/31/2019, 12:31 PM

## 2019-12-31 NOTE — Progress Notes (Addendum)
His troponin is slightly high and is likely due to stress.  Ordered an EKG nonacute.  However QTC is now greater than 100 ms.  Medication reviewed .  His magnesium phosphorus and potassium are normal  Plan -DC Protonix. Keep on pepcid -Pharmacy consult for medication review -Continue Precedex with continued monitoring with QTC in mind -Avoid QTC prolongation agents 0-serial troponin although elevation likely due to stress     SIGNATURE    Dr. Brand Males, M.D., F.C.C.P,  Pulmonary and Critical Care Medicine Staff Physician, [place] Director - Interstitial Lung Disease  Program  Pulmonary [place] [facility] [place], [place], [address]  Pager: [phone], If no answer or between  15:00h - 7:00h: call [phone] Telephone: [phone]  4:34 PM 12/31/2019

## 2020-01-01 ENCOUNTER — Inpatient Hospital Stay (HOSPITAL_COMMUNITY): Payer: Medicaid Other

## 2020-01-01 LAB — BLOOD GAS, ARTERIAL
Bicarbonate: 25.5 mmol/L (ref 20.0–28.0)
FIO2: 95.8
MECHVT: 510 mL
O2 Saturation: 98.2 %
PEEP: 5 cmH2O
Patient temperature: 102.8
RATE: 28 resp/min
pCO2 arterial: 30.7 mmHg — ABNORMAL LOW (ref 32.0–48.0)
pH, Arterial: 7.53 — ABNORMAL HIGH (ref 7.350–7.450)
pO2, Arterial: 107 mmHg (ref 83.0–108.0)

## 2020-01-01 LAB — COMPREHENSIVE METABOLIC PANEL
ALT: 17 U/L (ref 0–44)
AST: 17 U/L (ref 15–41)
Albumin: 2.2 g/dL — ABNORMAL LOW (ref 3.5–5.0)
Alkaline Phosphatase: 49 U/L (ref 38–126)
Anion gap: 12 (ref 5–15)
BUN: 25 mg/dL — ABNORMAL HIGH (ref 8–23)
CO2: 22 mmol/L (ref 22–32)
Calcium: 8 mg/dL — ABNORMAL LOW (ref 8.9–10.3)
Chloride: 108 mmol/L (ref 98–111)
Creatinine, Ser: 1.63 mg/dL — ABNORMAL HIGH (ref 0.61–1.24)
GFR calc Af Amer: 51 mL/min — ABNORMAL LOW (ref >60)
GFR calc non Af Amer: 44 mL/min — ABNORMAL LOW (ref >60)
Glucose, Bld: 117 mg/dL — ABNORMAL HIGH (ref 70–99)
Potassium: 4.2 mmol/L (ref 3.5–5.1)
Sodium: 142 mmol/L (ref 135–145)
Total Bilirubin: 1.4 mg/dL — ABNORMAL HIGH (ref 0.3–1.2)
Total Protein: 6.8 g/dL (ref 6.5–8.1)

## 2020-01-01 LAB — CBC
HCT: 33 % — ABNORMAL LOW (ref 39.0–52.0)
Hemoglobin: 10.5 g/dL — ABNORMAL LOW (ref 13.0–17.0)
MCH: 29.4 pg (ref 26.0–34.0)
MCHC: 31.8 g/dL (ref 30.0–36.0)
MCV: 92.4 fL (ref 80.0–100.0)
Platelets: 356 10*3/uL (ref 150–400)
RBC: 3.57 MIL/uL — ABNORMAL LOW (ref 4.22–5.81)
RDW: 16.6 % — ABNORMAL HIGH (ref 11.5–15.5)
WBC: 12.2 10*3/uL — ABNORMAL HIGH (ref 4.0–10.5)
nRBC: 0 % (ref 0.0–0.2)

## 2020-01-01 LAB — CBC WITH DIFFERENTIAL/PLATELET
Abs Immature Granulocytes: 0.1 10*3/uL — ABNORMAL HIGH (ref 0.00–0.07)
Basophils Absolute: 0 10*3/uL (ref 0.0–0.1)
Basophils Relative: 0 %
Eosinophils Absolute: 0.8 10*3/uL — ABNORMAL HIGH (ref 0.0–0.5)
Eosinophils Relative: 6 %
HCT: 30.1 % — ABNORMAL LOW (ref 39.0–52.0)
Hemoglobin: 9.3 g/dL — ABNORMAL LOW (ref 13.0–17.0)
Immature Granulocytes: 1 %
Lymphocytes Relative: 9 %
Lymphs Abs: 1.2 10*3/uL (ref 0.7–4.0)
MCH: 28.8 pg (ref 26.0–34.0)
MCHC: 30.9 g/dL (ref 30.0–36.0)
MCV: 93.2 fL (ref 80.0–100.0)
Monocytes Absolute: 1.9 10*3/uL — ABNORMAL HIGH (ref 0.1–1.0)
Monocytes Relative: 13 %
Neutro Abs: 10 10*3/uL — ABNORMAL HIGH (ref 1.7–7.7)
Neutrophils Relative %: 71 %
Platelets: 316 10*3/uL (ref 150–400)
RBC: 3.23 MIL/uL — ABNORMAL LOW (ref 4.22–5.81)
RDW: 16.7 % — ABNORMAL HIGH (ref 11.5–15.5)
WBC: 14 10*3/uL — ABNORMAL HIGH (ref 4.0–10.5)
nRBC: 0 % (ref 0.0–0.2)

## 2020-01-01 LAB — BASIC METABOLIC PANEL
Anion gap: 13 (ref 5–15)
BUN: 29 mg/dL — ABNORMAL HIGH (ref 8–23)
CO2: 25 mmol/L (ref 22–32)
Calcium: 8.2 mg/dL — ABNORMAL LOW (ref 8.9–10.3)
Chloride: 107 mmol/L (ref 98–111)
Creatinine, Ser: 1.68 mg/dL — ABNORMAL HIGH (ref 0.61–1.24)
GFR calc Af Amer: 49 mL/min — ABNORMAL LOW (ref >60)
GFR calc non Af Amer: 42 mL/min — ABNORMAL LOW (ref >60)
Glucose, Bld: 147 mg/dL — ABNORMAL HIGH (ref 70–99)
Potassium: 4.4 mmol/L (ref 3.5–5.1)
Sodium: 145 mmol/L (ref 135–145)

## 2020-01-01 LAB — MAGNESIUM: Magnesium: 2 mg/dL (ref 1.7–2.4)

## 2020-01-01 LAB — LACTIC ACID, PLASMA
Lactic Acid, Venous: 2.3 mmol/L (ref 0.5–1.9)
Lactic Acid, Venous: 2.7 mmol/L (ref 0.5–1.9)

## 2020-01-01 LAB — CULTURE, BLOOD (ROUTINE X 2)
Culture: NO GROWTH
Culture: NO GROWTH
Special Requests: ADEQUATE
Special Requests: ADEQUATE

## 2020-01-01 LAB — PROCALCITONIN: Procalcitonin: 0.35 ng/mL

## 2020-01-01 LAB — TROPONIN I (HIGH SENSITIVITY)
Troponin I (High Sensitivity): 90 ng/L — ABNORMAL HIGH (ref <18)
Troponin I (High Sensitivity): 70 ng/L — ABNORMAL HIGH (ref <18)
Troponin I (High Sensitivity): 52 ng/L — ABNORMAL HIGH (ref <18)
Troponin I (High Sensitivity): 73 ng/L — ABNORMAL HIGH (ref <18)

## 2020-01-01 LAB — BRAIN NATRIURETIC PEPTIDE: B Natriuretic Peptide: 497.6 pg/mL — ABNORMAL HIGH (ref 0.0–100.0)

## 2020-01-01 LAB — GLUCOSE, CAPILLARY: Glucose-Capillary: 131 mg/dL — ABNORMAL HIGH (ref 70–99)

## 2020-01-01 MED ORDER — ROCURONIUM BROMIDE 50 MG/5ML IV SOLN: 80.0000 mg | Freq: Once | INTRAVENOUS | Status: AC

## 2020-01-01 MED ORDER — INSULIN ASPART 100 UNIT/ML ~~LOC~~ SOLN
2.0000 [IU] | SUBCUTANEOUS | Status: DC
  Administered 2020-01-01 – 2020-01-02 (×2): 2 [IU] via SUBCUTANEOUS
  Administered 2020-01-02 – 2020-01-03 (×3): 4 [IU] via SUBCUTANEOUS
  Administered 2020-01-03: 2 [IU] via SUBCUTANEOUS
  Administered 2020-01-03: 6 [IU] via SUBCUTANEOUS
  Administered 2020-01-03 – 2020-01-04 (×5): 2 [IU] via SUBCUTANEOUS
  Administered 2020-01-04: 4 [IU] via SUBCUTANEOUS
  Administered 2020-01-05: 2 [IU] via SUBCUTANEOUS
  Administered 2020-01-05: 4 [IU] via SUBCUTANEOUS
  Administered 2020-01-05: 2 [IU] via SUBCUTANEOUS
  Administered 2020-01-05: 4 [IU] via SUBCUTANEOUS
  Administered 2020-01-06: 6 [IU] via SUBCUTANEOUS
  Administered 2020-01-06 – 2020-01-07 (×4): 2 [IU] via SUBCUTANEOUS
  Administered 2020-01-07 (×2): 6 [IU] via SUBCUTANEOUS
  Administered 2020-01-07: 2 [IU] via SUBCUTANEOUS
  Administered 2020-01-08: 4 [IU] via SUBCUTANEOUS
  Administered 2020-01-08: 2 [IU] via SUBCUTANEOUS
  Administered 2020-01-08: 4 [IU] via SUBCUTANEOUS
  Administered 2020-01-08: 2 [IU] via SUBCUTANEOUS
  Administered 2020-01-09: 4 [IU] via SUBCUTANEOUS
  Administered 2020-01-09: 2 [IU] via SUBCUTANEOUS
  Administered 2020-01-09: 4 [IU] via SUBCUTANEOUS
  Administered 2020-01-09 – 2020-01-10 (×4): 2 [IU] via SUBCUTANEOUS
  Administered 2020-01-10: 4 [IU] via SUBCUTANEOUS
  Administered 2020-01-11: 2 [IU] via SUBCUTANEOUS

## 2020-01-01 MED ORDER — MIDAZOLAM HCL 2 MG/2ML IJ SOLN
INTRAMUSCULAR | Status: AC | PRN
  Administered 2020-01-01 (×2): 0.5 mg via INTRAVENOUS

## 2020-01-01 MED ORDER — SODIUM BICARBONATE 8.4 % IV SOLN
INTRAVENOUS | Status: AC
  Administered 2020-01-01: 100 meq via INTRAVENOUS
  Filled 2020-01-01: qty 100

## 2020-01-01 MED ORDER — FUROSEMIDE 10 MG/ML IJ SOLN
40.0000 mg | Freq: Once | INTRAMUSCULAR | Status: AC
  Administered 2020-01-01: 40 mg via INTRAVENOUS

## 2020-01-01 MED ORDER — FUROSEMIDE 10 MG/ML IJ SOLN
40.0000 mg | Freq: Once | INTRAMUSCULAR | Status: AC
  Administered 2020-01-01: 40 mg via INTRAVENOUS
  Filled 2020-01-01: qty 4

## 2020-01-01 MED ORDER — MIDAZOLAM HCL 2 MG/2ML IJ SOLN
INTRAMUSCULAR | Status: AC
  Administered 2020-01-01: 1 mg via INTRAVENOUS
  Filled 2020-01-01: qty 2

## 2020-01-01 MED ORDER — FUROSEMIDE 10 MG/ML IJ SOLN
INTRAMUSCULAR | Status: AC
  Filled 2020-01-01: qty 4

## 2020-01-01 MED ORDER — MIDAZOLAM HCL (PF) 5 MG/ML IJ SOLN: 1.0000 mg | INTRAMUSCULAR | Status: DC | PRN

## 2020-01-01 MED ORDER — ETOMIDATE 2 MG/ML IV SOLN
20.0000 mg | Freq: Once | INTRAVENOUS | Status: AC
  Administered 2020-01-01: 20 mg via INTRAVENOUS

## 2020-01-01 MED ORDER — MIDAZOLAM HCL 2 MG/2ML IJ SOLN
1.0000 mg | INTRAMUSCULAR | Status: DC | PRN
  Administered 2020-01-02: 1 mg via INTRAVENOUS
  Administered 2020-01-02: 2 mg via INTRAVENOUS
  Filled 2020-01-01: qty 2
  Filled 2020-01-01: qty 4

## 2020-01-01 MED ORDER — ETOMIDATE 2 MG/ML IV SOLN
INTRAVENOUS | Status: AC
  Filled 2020-01-01: qty 20

## 2020-01-01 MED ORDER — FENTANYL CITRATE (PF) 100 MCG/2ML IJ SOLN
INTRAMUSCULAR | Status: AC | PRN
  Administered 2020-01-01 (×2): 25 ug via INTRAVENOUS

## 2020-01-01 MED ORDER — SODIUM BICARBONATE 8.4 % IV SOLN: 100.0000 meq | Freq: Once | INTRAVENOUS | Status: AC

## 2020-01-01 MED ORDER — POLYETHYLENE GLYCOL 3350 17 G PO PACK
17.0000 g | PACK | Freq: Every day | ORAL | Status: DC
  Filled 2020-01-01: qty 1

## 2020-01-01 MED ORDER — SODIUM CHLORIDE 0.9% FLUSH
5.0000 mL | Freq: Three times a day (TID) | INTRAVENOUS | Status: DC
  Administered 2020-01-01 – 2020-01-03 (×5): 5 mL

## 2020-01-01 MED ORDER — FENTANYL CITRATE (PF) 100 MCG/2ML IJ SOLN
50.0000 ug | INTRAMUSCULAR | Status: DC | PRN
  Administered 2020-01-01 – 2020-01-03 (×3): 100 ug via INTRAVENOUS
  Filled 2020-01-01 (×3): qty 2

## 2020-01-01 MED ORDER — MIDAZOLAM HCL 2 MG/2ML IJ SOLN
INTRAMUSCULAR | Status: AC
  Filled 2020-01-01: qty 4

## 2020-01-01 MED ORDER — FENTANYL CITRATE (PF) 100 MCG/2ML IJ SOLN
INTRAMUSCULAR | Status: AC
  Filled 2020-01-01: qty 4

## 2020-01-01 MED ORDER — FENTANYL CITRATE (PF) 100 MCG/2ML IJ SOLN
INTRAMUSCULAR | Status: AC
  Administered 2020-01-01: 25 ug via INTRAVENOUS
  Filled 2020-01-01: qty 2

## 2020-01-01 MED ORDER — FENTANYL CITRATE (PF) 100 MCG/2ML IJ SOLN: 25.0000 ug | Freq: Once | INTRAMUSCULAR | Status: AC

## 2020-01-01 MED ORDER — MIDAZOLAM HCL 2 MG/2ML IJ SOLN: 2.0000 mg | INTRAMUSCULAR | Status: DC | PRN

## 2020-01-01 MED ORDER — FUROSEMIDE 10 MG/ML IJ SOLN
40.0000 mg | Freq: Three times a day (TID) | INTRAMUSCULAR | Status: DC
  Administered 2020-01-01 – 2020-01-02 (×2): 40 mg via INTRAVENOUS
  Filled 2020-01-01 (×2): qty 4

## 2020-01-01 MED ORDER — ROCURONIUM BROMIDE 10 MG/ML (PF) SYRINGE
PREFILLED_SYRINGE | INTRAVENOUS | Status: AC
  Administered 2020-01-01: 80 mg
  Filled 2020-01-01: qty 10

## 2020-01-01 MED ORDER — FENTANYL CITRATE (PF) 100 MCG/2ML IJ SOLN: 50.0000 ug | INTRAMUSCULAR | Status: DC | PRN

## 2020-01-01 MED ORDER — PROPOFOL 1000 MG/100ML IV EMUL
0.0000 ug/kg/min | INTRAVENOUS | Status: DC
  Administered 2020-01-01: 5 ug/kg/min via INTRAVENOUS
  Administered 2020-01-02: 15 ug/kg/min via INTRAVENOUS
  Administered 2020-01-03: 10 ug/kg/min via INTRAVENOUS
  Filled 2020-01-01 (×4): qty 100

## 2020-01-01 MED ORDER — DOCUSATE SODIUM 50 MG/5ML PO LIQD
100.0000 mg | Freq: Two times a day (BID) | ORAL | Status: DC
  Administered 2020-01-01: 100 mg via ORAL
  Filled 2020-01-01: qty 10

## 2020-01-01 NOTE — Procedures (Signed)
Interventional Radiology Procedure:   Indications: Concern for infected right renal cyst  Procedure: CT guided drain placement  Findings: Right renal cyst with surrounding fluid.  Difficult to place drain due to small size but eventually 10 Fr drain placed and 30 ml of bloody fluid removed.  Complications: None     EBL: less than 20 ml  Plan: Send fluid for culture and follow output.    Marco [name] R. Anselm Pancoast, MD  Pager: [phone]

## 2020-01-01 NOTE — Consult Note (Signed)
Chief Complaint: Patient was seen in consultation today for right renal cyst aspiration/possible drain placement.  Referring Physician(s): Irine Seal  Supervising Physician: Markus Daft  Patient Status: [facility] - In-pt  History of Present Illness: Marco [name] is a 64 y.o. male with a past medical history significant for chronic pain, RA, GERD, diverticulosis, CKD, OSA, HTN, CHF, CAD, a.fib s/p ablation and recent admission for pyelonephritis who presented to the ED on 12/27/19 with complaints of weakness, lethargy and increased falls. Initial workup showed him to be febrile at 100.8 with leukocytosis and elevated lactic acid levels - he was admitted for suspected sepsis. Imaging showed a stable 3.9 cm x 2.9 cm exophytic isodense area along the posterior aspect of the upper pole of the right kidney with moderate to marked amount or surrounding inflammatory fat stranding likely representing a hemorrhagic cyst with associated right sided pyelonephritis. He has continued to have a decline in mental status as well as persistent fevers despite IV antibiotic therapy and no obvious source of infection as been found. IR has been asked to perform an image guided aspiration/possible drain placement of the right renal cyst due to concern for infectious source.  Marco [name] seen in ICU, he is sleeping and does not arouse to voice cues - he grimaces in pain when I palpate his abdomen but does not answer any questions or open his eyes. No family at bedside, per RN unable to reach wife so far today.   Past Medical History:  Diagnosis Date   Benign colon polyp 08/01/2013   [facility] in [place]. large base tranverse colon polyp was biopsied. polyp was benign with minimal surface hyperplastic change.   Chronic combined systolic and diastolic CHF (congestive heart failure) (HCC)    Chronic pain    CKD (chronic kidney disease), stage III    Diabetes mellitus without complication ([place])     Diverticulosis    Esophageal hiatal hernia 07/29/2013   confirmed on EGD    Esophageal stricture    Essential hypertension    Gastritis 07/29/2013   confirmed on EGD, bx done an negative for intestinal metaplasia, dsyplasia or H. pylori. normal gastric emptying study done 07/13/2013.   GERD (gastroesophageal reflux disease)    Morbid obesity ([place])    Non-obstructive CAD    a. 02/2013 Cath ([place]): nonobs dzs;  b. 09/2014 Myoview ([place]): EF 55%, no ischemia;  c. Cath 05/2018 mild CAD no obstruction.   Persistent atrial fibrillation ([place])    a. 02/2013 s/p rfca in [place], [place]-->prev on Xarelto, d/c'd 2/2 anemia, ? GIB.   Rheumatoid arthritis ([place])    Sigmoid diverticulosis 08/01/2013   confirmed on colonscopy. record scanned into chart    Past Surgical History:  Procedure Laterality Date   CARDIAC CATHETERIZATION  05/2014   ablation for atrial fibrillation   CARDIAC CATHETERIZATION N/A 11/16/2015   Procedure: Left Heart Cath and Coronary Angiography;  Surgeon: Leonie Man, MD;  Location: [place] [facility];  Service: Cardiovascular;  Laterality: N/A;   COLON RESECTION  09/2013   due to large, abnormal polpy. non cancerous per patient.    COLON SURGERY  09/2014   colon resection    LEFT HEART CATH AND CORONARY ANGIOGRAPHY N/A 05/19/2018   Procedure: LEFT HEART CATH AND CORONARY ANGIOGRAPHY;  Surgeon: Wellington Hampshire, MD;  Location: [place] [facility];  Service: Cardiovascular;  Laterality: N/A;    Allergies: Nsaids  Medications: Prior to Admission medications   Medication Sig Start  Date End Date Taking? Authorizing Provider  albuterol (PROVENTIL HFA;VENTOLIN HFA) 108 (90 Base) MCG/ACT inhaler Inhale 2 puffs into the lungs every 6 (six) hours as needed for wheezing or shortness of breath. 04/29/18  Yes [name], MD  allopurinol (ZYLOPRIM) 100 MG tablet Take 200 mg by mouth every evening.    Yes [provider]  atorvastatin (LIPITOR) 40 MG tablet Take  1 tablet (40 mg total) by mouth daily. 09/27/19  Yes Sueanne Margarita, MD  carvedilol (COREG) 12.5 MG tablet Take 1 tablet (12.5 mg total) by mouth 2 (two) times daily with a meal. 09/12/19  Yes Larey Dresser, MD  diclofenac Sodium (VOLTAREN) 1 % GEL Apply 4 g topically 4 (four) times daily. 10/23/19  Yes Darr, Marguerita Beards, [name]  DULoxetine (CYMBALTA) 20 MG capsule TAKE 1 CAPSULE BY MOUTH ONCE DAILY Patient taking differently: Take 20 mg by mouth daily.  12/21/19  Yes [name], MD  EQ ASPIRIN ADULT LOW DOSE 81 MG EC tablet TAKE 1 TABLET BY MOUTH ONCE DAILY. SWALLOW WHOLE Patient taking differently: Take 81 mg by mouth at bedtime.  08/01/19  Yes [name], MD  famotidine (PEPCID) 20 MG tablet TAKE 1 TABLET BY MOUTH TWO TIMES DAILY Patient taking differently: Take 20 mg by mouth 2 (two) times daily.  12/21/19  Yes [name], MD  fluticasone (FLONASE) 50 MCG/ACT nasal spray Place 2 sprays into both nostrils daily. 09/16/19  Yes [name], MD  furosemide (LASIX) 40 MG tablet Take 1 tablet (40 mg total) by mouth daily. Can take extra as needed 12/16/19 12/15/20 Yes Bensimhon, Shaune Pascal, MD  Insulin Lispro Prot & Lispro (HUMALOG MIX 75/25 KWIKPEN) (75-25) 100 UNIT/ML Kwikpen Inject 100 Units into the skin daily with breakfast. 12/20/19  Yes Renato Shin, MD  oxyCODONE (OXY IR/ROXICODONE) 5 MG immediate release tablet Take 5 mg by mouth every 6 (six) hours. 11/09/19  Yes [provider]  potassium chloride SA (KLOR-CON) 20 MEQ tablet Take 20 mEq by mouth daily. 12/05/19  Yes [provider]  predniSONE (DELTASONE) 5 MG tablet Take 5 mg by mouth daily. 11/23/19  Yes [provider]  pregabalin (LYRICA) 100 MG capsule Take 100 mg by mouth 3 (three) times daily.    Yes [provider]  sacubitril-valsartan (ENTRESTO) 49-51 MG Take 1 tablet by mouth 2 (two) times daily. Patient taking differently: Take 0.5 tablets by mouth 2 (two) times daily.  12/16/19  Yes  Bensimhon, Shaune Pascal, MD  Secukinumab (COSENTYX) 150 MG/ML SOSY Inject 150 mg into the skin every 28 (twenty-eight) days.  06/02/16  Yes [provider]  Tafamidis (VYNDAMAX) 61 MG CAPS Take 61 mg by mouth daily. 09/08/19  Yes Bensimhon, Shaune Pascal, MD  zolpidem (AMBIEN CR) 6.25 MG CR tablet TAKE 1 TABLET BY MOUTH EVERY DAY AT BEDTIME AS NEEDED FOR SLEEP Patient taking differently: Take 6.25 mg by mouth at bedtime.  12/02/19  Yes [name], MD  Accu-Chek FastClix Lancets MISC Use one strip to monitor glucose levels BID; E11.42 12/06/18   Renato Shin, MD  glucose blood (ACCU-CHEK GUIDE) test strip Use one strip to monitor glucose levels BID; E11.42 12/06/18   Renato Shin, MD  Insulin Pen Needle (PEN NEEDLES) 31G X 6 MM MISC 1 each by Does not apply route daily. E11.9 10/19/19   Renato Shin, MD     Family History  Problem Relation Age of Onset   Hypertension Mother    Diabetes  Mother    COPD Mother    Lung cancer Mother        Smoker    Hypertension Father    Diabetes Father    Heart Problems Father    COPD Father    Colon cancer Neg Hx    Stomach cancer Neg Hx     Social History   Socioeconomic History   Marital status: Married    Spouse name: Marco [name]   Number of children: 8   Years of education: 13   Highest education level: Some college, no degree  Occupational History    Comment: disabled  Tobacco Use   Smoking status: Former Smoker    Packs/day: 0.50    Years: 10.00    Pack years: 5.00    Types: Cigarettes    Quit date: 04/11/2008    Years since quitting: 11.7   Smokeless tobacco: Never Used  Substance and Sexual Activity   Alcohol use: No    Alcohol/week: 0.0 standard drinks   Drug use: No   Sexual activity: Yes    Partners: Female    Birth control/protection: None  Other Topics Concern   Not on file  Social History Narrative   Lives with wife. Does not work.  On disability.    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 Provided crisis assistance program information to assist with bills       Has issues getting food- gets $36/month in food stamps- already has list of food pantries but has not tried them- encouraged pt to try food pantries and reach out to clinic for help if needed   Social Determinants of Health   Financial Resource Strain:    Difficulty of Paying Living Expenses:   Food Insecurity:    Worried About Charity fundraiser in the Last Year:    Arboriculturist in the Last Year:   Transportation Needs:    Film/video editor (Medical):    Lack of Transportation (Non-Medical):   Physical Activity:    Days of Exercise per Week:    Minutes of Exercise per Session:   Stress:    Feeling of Stress :   Social Connections:    Frequency of Communication with Friends and Family:    Frequency of Social Gatherings with Friends and Family:    Attends Religious Services:    Active Member of Clubs or Organizations:    Attends Archivist Meetings:    Marital Status:      Review of Systems: A 12 point ROS discussed and pertinent positives are indicated in the HPI above.  All other systems are negative.  Review of Systems  Unable to perform ROS: Mental status change    Vital Signs: BP (!) 148/66    Pulse 63    Temp 98.3 F (36.8 C) (Oral)    Resp 17    Ht 5\' 6"  (1.676 m)    Wt 238 lb 12.1 oz (108.3 kg)    SpO2 100%    BMI 38.54 kg/m   Physical Exam Vitals and nursing note reviewed.  Constitutional:      Appearance: He is obese. He is ill-appearing.     Comments: Sleeping, does not respond to verbal cues  HENT:     Head: Normocephalic.  Cardiovascular:     Rate and Rhythm: Normal rate and regular rhythm.  Pulmonary:     Effort: Pulmonary effort is normal.     Comments: Snoring  respirations - anterior exam only [place] on patient's right ear, attempted to replace but patient would move head and dislodge  Abdominal:     General: There is no distension.     Palpations: Abdomen is soft.     Tenderness: There is abdominal tenderness.  Skin:    General: Skin is warm and dry.      MD Evaluation Airway: Other (comments) Airway comments: Difficult to assess - patient does not follow commands Heart: WNL Abdomen: WNL Chest/ Lungs: WNL ASA  Classification: 3 Mallampati/Airway Score: (Unable to assess - patient does not follow commands)   Imaging: CT ABDOMEN PELVIS WO CONTRAST  Result Date: 12/28/2019 CLINICAL DATA:  Abdominal pain. EXAM: CT ABDOMEN AND PELVIS WITHOUT CONTRAST TECHNIQUE: Multidetector CT imaging of the abdomen and pelvis was performed following the standard protocol without IV contrast. COMPARISON:  Dec 15, 2019 FINDINGS: Lower chest: Mild atelectasis is seen within the posterior aspect of the bilateral lung bases. There are small bilateral pleural effusions. Hepatobiliary: Stable 8 mm focus of parenchymal low attenuation is seen within the inferior medial aspect of the right lobe of the liver. No gallstones, gallbladder wall thickening, or biliary dilatation. Pancreas: Unremarkable. No pancreatic ductal dilatation or surrounding inflammatory changes. Spleen: Normal in size without focal abnormality. Adrenals/Urinary Tract: Adrenal glands are unremarkable. The kidneys are normal in size. A stable 3.9 cm x 2.9 cm exophytic isodense area is seen along the posterior aspect of the upper pole of the right kidney. A moderate to marked amount of surrounding inflammatory fat stranding is seen which is increased in severity when compared to the prior study. A stable 2.2 cm exophytic cyst is seen along the anterior aspect of the mid left kidney. Bilateral 2 mm and 3 mm nonobstructing renal stones are seen. Bladder is unremarkable. Stomach/Bowel: Stomach is within normal  limits. The appendix is not identified. Surgically anastomosed bowel is seen along the proximal portion of the transverse colon. No evidence of bowel dilatation. Noninflamed diverticula are seen within the descending and sigmoid colon. Vascular/Lymphatic: Moderate severity aortic calcification. No enlarged abdominal or pelvic lymph nodes. Reproductive: Prostate is unremarkable. Other: There is a 4.6 cm x 4.1 cm fat containing right inguinal hernia. A 3.6 cm x 2.7 cm fat containing left inguinal hernia is also seen. Musculoskeletal: No acute or significant osseous findings. IMPRESSION: 1. Small bilateral pleural effusions with mild bibasilar atelectasis. 2. Stable 3.9 cm x 2.9 cm exophytic isodense area along the posterior aspect of the upper pole of the right kidney with moderate to marked amount of surrounding inflammatory fat stranding. This likely represents a hemorrhagic cyst, with associated right-sided pyelonephritis. 3. Stable left renal cyst. 4. Bilateral nonobstructing renal stones. 5. Colonic diverticulosis. 6. Bilateral fat-containing inguinal hernias. Aortic Atherosclerosis (ICD10-I70.0). Electronically Signed   By: Virgina Norfolk M.D.   On: 12/28/2019 16:50   CT ABDOMEN PELVIS WO CONTRAST  Result Date: 12/09/2019 CLINICAL DATA:  Sepsis, abdominal pain tenderness severe acute kidney injury EXAM: CT ABDOMEN AND PELVIS WITHOUT CONTRAST TECHNIQUE: Multidetector CT imaging of the abdomen and pelvis was performed following the standard protocol without IV contrast. COMPARISON:  MRI abdomen August 18, 2019 FINDINGS: Lower chest: There is moderate cardiomegaly. Trace pericardial effusion/thickening is seen. Streaky atelectasis seen at both lung bases. There is a small hiatal hernia. Hepatobiliary: The liver  is normal in density without focal abnormality.The main portal vein is patent. No evidence of calcified gallstones, gallbladder wall thickening or biliary dilatation. Pancreas: Unremarkable. No  pancreatic ductal dilatation or surrounding inflammatory changes. Spleen: Normal in size without focal abnormality. Adrenals/Urinary Tract: Both adrenal glands appear normal. Again noted are several partially exophytic lesions within both kidneys. The largest which is slightly hyperdense within the upper pole of the right kidney measuring 3.8 cm most recently found to be a renal cyst. There scattered bilateral renal calculi present the largest measuring 6 mm in the lower pole of the left kidney. There is mild to moderate right-sided perinephric stranding, predominantly around the lower pole. No hydronephrosis however is seen. No ureteral or bladder calculi are noted. Stomach/Bowel: The stomach, small bowel, and colon are normal in appearance. No inflammatory changes, wall thickening, or obstructive findings.Scattered colonic diverticula are noted without diverticulitis. Vascular/Lymphatic: There are no enlarged mesenteric, retroperitoneal, or pelvic lymph nodes. Scattered aortic atherosclerotic calcifications are seen without aneurysmal dilatation. Reproductive: The prostate is unremarkable. Other: Bilateral fat containing inguinal hernias are present. Musculoskeletal: No acute or significant osseous findings. IMPRESSION: Mild-to-moderate right-sided perinephric fat stranding changes which could be due to infectious /inflammatory process or recently passed stone. Bilateral nonobstructing renal calculi. Diverticulosis without diverticulitis. Aortic Atherosclerosis (ICD10-I70.0). Electronically Signed   By: Prudencio Pair M.D.   On: 12/09/2019 03:34   DG Chest 1 View  Result Date: 12/29/2019 CLINICAL DATA:  Shortness of breath EXAM: CHEST  1 VIEW COMPARISON:  Dec 27, 2019 FINDINGS: There is mild atelectatic change in the right lower lung region. The lungs elsewhere are clear. There is cardiomegaly with pulmonary vascularity normal. No adenopathy. No bone lesions. IMPRESSION: Mild right lower lung region atelectasis.  Lungs elsewhere clear. Stable cardiomegaly. Electronically Signed   By: Lowella Grip III M.D.   On: 12/29/2019 13:42   CT Head Wo Contrast  Result Date: 12/27/2019 CLINICAL DATA:  Altered mental status. EXAM: CT HEAD WITHOUT CONTRAST TECHNIQUE: Contiguous axial images were obtained from the base of the skull through the vertex without intravenous contrast. COMPARISON:  Head CT 12/09/2019 FINDINGS: Brain: The ventricles are normal in size and configuration. No extra-axial fluid collections are identified. The gray-white differentiation is maintained. No CT findings for acute hemispheric infarction or intracranial hemorrhage. No mass lesions. The brainstem and cerebellum are normal. Vascular: Vascular calcifications, advanced for age but no aneurysm or hyperdense vessels. Skull: No acute skull fracture. No bone lesion. Sinuses/Orbits: Stable severe chronic left maxillary and left half sphenoid sinus disease with partially calcified inspissated debris also extending into the left nasal cavity. Sinus wall thickening noted. The other paranasal sinuses and mastoid air cells are clear. The globes are intact. Other: No scalp lesions, laceration or hematoma. IMPRESSION: 1. No acute intracranial findings or mass lesions. 2. Stable severe chronic left maxillary and left half sphenoid sinus disease. Electronically Signed   By: Marijo Sanes M.D.   On: 12/27/2019 07:05   CT HEAD WO CONTRAST  Result Date: 12/09/2019 CLINICAL DATA:  Altered mental status. EXAM: CT HEAD WITHOUT CONTRAST TECHNIQUE: Contiguous axial images were obtained from the base of the skull through the vertex without intravenous contrast. COMPARISON:  May 17, 2019 FINDINGS: Brain: No evidence of acute infarction, hemorrhage, hydrocephalus, extra-axial collection or mass lesion/mass effect. Vascular: No hyperdense vessel or unexpected calcification. Skull: Normal. Negative for fracture or focal lesion. Sinuses/Orbits: There is stable marked  severity left maxillary sinus, left ethmoid sinus and sphenoid sinus mucosal thickening. Stable hyperdense  secretions are again seen. A chronic deformity of the medial wall of the left maxillary sinus is also seen. Other: None. IMPRESSION: 1. No acute intracranial abnormality. 2. Stable left maxillary sinus, left ethmoid sinus and sphenoid sinus disease. Electronically Signed   By: Virgina Norfolk M.D.   On: 12/09/2019 03:16   MR BRAIN WO CONTRAST  Result Date: 12/30/2019 CLINICAL DATA:  Encephalopathy.  Sepsis. EXAM: MRI HEAD WITHOUT CONTRAST TECHNIQUE: Multiplanar, multiecho pulse sequences of the brain and surrounding structures were obtained without intravenous contrast. COMPARISON:  Head CT 12/27/2019 FINDINGS: Brain: Diffusion imaging does not show any acute or subacute infarction or other cause of restricted diffusion. No focal abnormality affects the brainstem or cerebellum. Cerebral hemispheres show minimal small vessel change of the white matter. No cortical or large vessel territory infarction. No mass lesion, hemorrhage, hydrocephalus or extra-axial collection. Vascular: Major vessels at the base of the brain show flow. Skull and upper cervical spine: Negative Sinuses/Orbits: Opacification of the left maxillary sinus, the sphenoid sinus and some of the left ethmoid sinuses. Internal material is hypointense on T2 imaging. This could be chronic inspissated mucus or chronic fungal sinusitis. Other: None IMPRESSION: No significant intracranial finding. No acute infarction or other acute process. Sinus disease primarily affecting the left maxillary and sphenoid sinus. The sinuses could contain chronic inspissated mucus or could be affected by fungal sinusitis. Electronically Signed   By: Nelson Chimes M.D.   On: 12/30/2019 15:34   US PELVIS (TRANSABDOMINAL ONLY)  Result Date: 12/27/2019 CLINICAL DATA:  Prostatitis EXAM: ULTRASOUND OF THE MALE PELVIS COMPARISON:  None. FINDINGS: Bladder: Normal.  Prevoid bladder volume 97 cc. Postvoid bladder volume not obtained as patient was sleeping during the exam. Prostate gland:  2.6 x 2.7 x 3.3 cm with a volume of 11.8 cc. Seminal vesicles:  Normal IMPRESSION: 1. Normal exam. Electronically Signed   By: Kerby Moors M.D.   On: 12/27/2019 09:47   CT Abdomen Pelvis W Contrast  Result Date: 12/15/2019 CLINICAL DATA:  Abdominal abscess/infection. Weakness and hypoglycemia. Recent kidney infection. EXAM: CT ABDOMEN AND PELVIS WITH CONTRAST TECHNIQUE: Multidetector CT imaging of the abdomen and pelvis was performed using the standard protocol following bolus administration of intravenous contrast. CONTRAST:  172mL OMNIPAQUE IOHEXOL 300 MG/ML  SOLN COMPARISON:  CT of the abdomen and pelvis on 12/09/2019 FINDINGS: Lower chest: There is bibasilar atelectasis. Heart is mildly enlarged. There is atherosclerotic calcification of coronary arteries. Hepatobiliary: There is minimally heterogeneous appearance of the LEFT hepatic lobe. No suspicious liver lesion. Gallbladder is present. Pancreas: Unremarkable. No pancreatic ductal dilatation or surrounding inflammatory changes. Spleen: Normal in size without focal abnormality. Adrenals/Urinary Tract: Adrenal glands are normal in appearance. Isodense 3.9 centimeter mass adjacent to the UPPER pole the RIGHT kidney appears LOWER in attenuation compared with most recent exam and is consistent with benign cyst based on previous MRI evaluation. Smaller cysts are identified in the kidneys bilaterally. Numerous nonobstructing intrarenal calculi bilaterally. Ureters are unremarkable. There is similar perinephric stranding posterior to the RIGHT kidney. Urinary bladder is normal in appearance. Stomach/Bowel: Stomach and small bowel loops are normal in appearance. Previous ascending colectomy. Normal appearance of the anastomosis in the RIGHT UPPER QUADRANT. Numerous colonic diverticula are present. No acute diverticulitis.  Vascular/Lymphatic: There is atherosclerotic calcification of the abdominal aorta. No associated aneurysm. Small retroperitoneal lymph nodes are identified, measuring on the order of 8 millimeters and smaller. No suspicious lymphadenopathy. Although involved by atherosclerosis, there is vascular opacification of the celiac axis, superior  mesenteric artery, and inferior mesenteric artery. Normal appearance of the portal venous system and inferior vena cava. Reproductive: Prostate is unremarkable. Other: No ascites. There is fat within the inguinal rings bilaterally. Musculoskeletal: Degenerative changes are present in the LOWER thoracic spine. IMPRESSION: 1. Stable appearance of RIGHT perinephric stranding, bilateral renal cysts, and bilateral intrarenal calculi. 2. Heterogeneous appearance of the anterior LEFT hepatic lobe. There is no perihepatic fluid. Considerations include trauma and liver laceration, scarring, and differential perfusion. Correlation is recommended with history. 3. Previous ascending colectomy. Normal appearance of the anastomosis in the RIGHT UPPER QUADRANT. 4. Colonic diverticulosis. No acute diverticulitis. 5. Coronary artery disease. 6. Aortic Atherosclerosis (ICD10-I70.0). Electronically Signed   By: Nolon Nations M.D.   On: 12/15/2019 13:53   DG CHEST PORT 1 VIEW  Result Date: 12/31/2019 CLINICAL DATA:  64 year old male with a history of shortness of breath EXAM: PORTABLE CHEST 1 VIEW COMPARISON:  12/29/2019 FINDINGS: Cardiomediastinal silhouette unchanged with cardiomegaly. Interlobular septal thickening. Mild patchy airspace opacity in the lower lungs slightly increased from the comparison. No pneumothorax or pleural effusion. IMPRESSION: Evidence of early pulmonary edema and basilar atelectasis. Electronically Signed   By: Corrie Mckusick D.O.   On: 12/31/2019 09:43   DG Chest Port 1 View  Result Date: 12/27/2019 CLINICAL DATA:  Weakness and lethargy EXAM: PORTABLE CHEST 1 VIEW  COMPARISON:  12/15/2019 FINDINGS: Stable borderline heart size and mildly tortuous aortic contours. Mild interstitial crowding at the bases. There is no edema, consolidation, effusion, or pneumothorax. IMPRESSION: No acute finding. Electronically Signed   By: Monte Fantasia M.D.   On: 12/27/2019 05:31   DG Chest Port 1 View  Result Date: 12/15/2019 CLINICAL DATA:  Pt arrives POV from home with complaints of weakness and hypoglycemia. Pts CBG upon arrival 26. Pt provided with orange juice. Pt reports he was just dc'ed from here on Tuesday for a kidney infection. hx of CHF, AFIB, and DM. EXAM: PORTABLE CHEST 1 VIEW COMPARISON:  12/08/2019 FINDINGS: Cardiac silhouette is normal in size. No mediastinal or hilar masses. No evidence of adenopathy. Clear lungs.  No pleural effusion or pneumothorax. Skeletal structures are grossly intact. IMPRESSION: No active disease. Electronically Signed   By: Lajean Manes M.D.   On: 12/15/2019 12:18   DG Chest Port 1 View  Result Date: 12/08/2019 CLINICAL DATA:  Altered mental status, minimally responsive since Sunday. EXAM: PORTABLE CHEST 1 VIEW COMPARISON:  September 05, 2018 FINDINGS: There is no evidence of acute infiltrate, pleural effusion or pneumothorax. The heart size and mediastinal contours are within normal limits. Mild to moderate severity degenerative changes are again seen within the mid to lower thoracic spine. IMPRESSION: No active disease. Electronically Signed   By: Virgina Norfolk M.D.   On: 12/08/2019 21:22   DG Knee Right Port  Result Date: 12/27/2019 CLINICAL DATA:  [place] 1 week ago. Persistent knee pain. EXAM: PORTABLE RIGHT KNEE - 1-2 VIEW COMPARISON:  None. FINDINGS: Mild tricompartmental degenerative changes but no acute bony findings or osteochondral lesion. No joint effusion. Age advanced vascular calcifications are noted. IMPRESSION: Mild degenerative changes but no acute bony findings or joint effusion. Age advanced vascular calcifications.  Electronically Signed   By: Marijo Sanes M.D.   On: 12/27/2019 07:06   DG Abd Portable 1V  Result Date: 12/10/2019 CLINICAL DATA:  Lower abdominal pain. Lethargy. EXAM: PORTABLE ABDOMEN - 1 VIEW COMPARISON:  12/09/2019 CT abdomen/pelvis FINDINGS: No disproportionately dilated small bowel loops. Mild colonic gas and stool. No evidence of  pneumatosis or pneumoperitoneum. There is a 3 mm left mid renal stone. Mild lumbar spondylosis. Calcified venous phleboliths in the deep pelvis. IMPRESSION: 1. Nonobstructive bowel gas pattern. 2. Left nephrolithiasis. Electronically Signed   By: Ilona Sorrel M.D.   On: 12/10/2019 10:20   ECHOCARDIOGRAM COMPLETE  Result Date: 12/31/2019    ECHOCARDIOGRAM REPORT   Patient Name:   SUEO ISRAEL Date of Exam: 12/31/2019 Medical Rec #:  [id number]     Height:       66.0 in Accession #:    [id number]    Weight:       238.8 lb Date of Birth:  1956-08-09     BSA:          2.156 m Patient Age:    30 years      BP:           180/78 mmHg Patient Gender: M             HR:           95 bpm. Exam Location:  Inpatient Procedure: 2D Echo Indications:    fever 780.6  History:        Patient has prior history of Echocardiogram examinations. CHF,                 CAD, Arrythmias:Atrial Fibrillation; Risk Factors:Diabetes,                 Hypertension and Former Smoker.  Sonographer:    Jannett Celestine RDCS (AE) Referring Phys: [id number] [facility]  Sonographer Comments: Image acquisition challenging due to respiratory motion. restricted mobility. patient on BiPap machine during exam IMPRESSIONS  1. Mild hypokinesis of the distal septum and apex. Left ventricular ejection fraction, by estimation, is 50 to 55%. The left ventricle has low normal function. The left ventricle demonstrates regional wall motion abnormalities (see scoring diagram/findings for description). There is mild concentric left ventricular hypertrophy. Left ventricular diastolic parameters are consistent with Grade I diastolic  dysfunction (impaired relaxation).  2. Right ventricular systolic function is normal. The right ventricular size is normal.  3. Right atrial size was mildly dilated.  4. The mitral valve is normal in structure. No evidence of mitral valve regurgitation. No evidence of mitral stenosis.  5. The aortic valve is tricuspid. Aortic valve regurgitation is not visualized. No aortic stenosis is present.  6. The inferior vena cava is normal in size with greater than 50% respiratory variability, suggesting right atrial pressure of 3 mmHg. FINDINGS  Left Ventricle: Mild hypokinesis of the distal septum and apex. Left ventricular ejection fraction, by estimation, is 50 to 55%. The left ventricle has low normal function. The left ventricle demonstrates regional wall motion abnormalities. The left ventricular internal cavity size was normal in size. There is mild concentric left ventricular hypertrophy. Left ventricular diastolic parameters are consistent with Grade I diastolic dysfunction (impaired relaxation). Indeterminate filling pressures. Right Ventricle: The right ventricular size is normal. No increase in right ventricular wall thickness. Right ventricular systolic function is normal. Left Atrium: Left atrial size was normal in size. Right Atrium: Right atrial size was mildly dilated. Pericardium: There is no evidence of pericardial effusion. Mitral Valve: The mitral valve is normal in structure. Normal mobility of the mitral valve leaflets. No evidence of mitral valve regurgitation. No evidence of mitral valve stenosis. Tricuspid Valve: The tricuspid valve is normal in structure. Tricuspid valve regurgitation is trivial. No evidence of tricuspid stenosis. Aortic Valve: The aortic valve is tricuspid. Aortic valve  regurgitation is not visualized. No aortic stenosis is present. Pulmonic Valve: The pulmonic valve was normal in structure. Pulmonic valve regurgitation is not visualized. No evidence of pulmonic stenosis. Aorta:  The aortic root is normal in size and structure. Venous: The inferior vena cava is normal in size with greater than 50% respiratory variability, suggesting right atrial pressure of 3 mmHg. IAS/Shunts: No atrial level shunt detected by color flow Doppler.  LEFT VENTRICLE PLAX 2D LVIDd:         5.30 cm  Diastology LVIDs:         3.70 cm  LV e' lateral:   6.20 cm/s LV PW:         1.20 cm  LV E/e' lateral: 12.3 LV IVS:        1.20 cm  LV e' medial:    5.77 cm/s LVOT diam:     2.20 cm  LV E/e' medial:  13.2 LV SV:         64 LV SV Index:   30 LVOT Area:     3.80 cm  RIGHT VENTRICLE RV S prime:     15.30 cm/s TAPSE (M-mode): 2.0 cm LEFT ATRIUM             Index       RIGHT ATRIUM           Index LA diam:        3.20 cm 1.48 cm/m  RA Area:     23.10 cm LA Vol (A2C):   49.6 ml 23.00 ml/m RA Volume:   70.00 ml  32.47 ml/m LA Vol (A4C):   45.3 ml 21.01 ml/m LA Biplane Vol: 51.4 ml 23.84 ml/m  AORTIC VALVE LVOT Vmax:   112.00 cm/s LVOT Vmean:  83.800 cm/s LVOT VTI:    0.168 m  AORTA Ao Root diam: 3.20 cm MITRAL VALVE               TRICUSPID VALVE MV Area (PHT): 3.77 cm    TR Peak grad:   28.3 mmHg MV Decel Time: 201 msec    TR Vmax:        266.00 cm/s MV E velocity: 76.30 cm/s MV A velocity: 87.80 cm/s  SHUNTS MV E/A ratio:  0.87        Systemic VTI:  0.17 m                            Systemic Diam: 2.20 cm Skeet Latch MD Electronically signed by Skeet Latch MD Signature Date/Time: 12/31/2019/12:49:48 PM    Final     Labs:  CBC: Recent Labs    12/30/19 0512 12/31/19 0230 12/31/19 1203 01/01/20 0750  WBC 16.8* 15.8* 15.0* 14.0*  HGB 9.7* 9.4* 8.8* 9.3*  HCT 30.1* 30.3* 28.3* 30.1*  PLT 267 278 284 316    COAGS: Recent Labs    12/08/19 2014 12/30/19 1106  INR 1.3* 1.2  APTT 33  --     BMP: Recent Labs    12/29/19 0526 12/30/19 1106 12/31/19 0230 12/31/19 1203  NA 138 140 142 143  K 4.5 4.2 3.9 4.0  CL 104 105 110 109  CO2 25 24 23 25   GLUCOSE 91 113* 100* 120*  BUN 12 17 18  22   CALCIUM 8.0* 8.2* 8.1* 7.7*  CREATININE 1.48* 1.51* 1.65* 1.73*  GFRNONAA 49* 48* 43* 41*  GFRAA 57* 56* 50* 47*    LIVER FUNCTION TESTS: Recent  Labs    12/08/19 2014 12/08/19 2014 12/09/19 0210 12/10/19 0500 12/15/19 1112 12/31/19 1203  BILITOT 0.8  --   --  0.9 1.0 1.0  AST 24  --   --  31 27 16   ALT 20  --   --  26 47* 17  ALKPHOS 45  --   --  49 65 50  PROT 7.5  --   --  6.7 7.6 6.7  ALBUMIN 2.7*   < > 2.3* 2.2* 2.7* 2.2*   < > = values in this interval not displayed.    TUMOR MARKERS: No results for input(s): AFPTM, CEA, CA199, CHROMGRNA in the last 8760 hours.  Assessment and Plan:  64 y/o M admitted for suspected sepsis who has had a continued decline in his mental status as well as persistent fevers despite IV antibiotics - no source of sepsis has been identified so far. Imaging showed a likely hemorrhagic right renal cyst and IR has been consulted for image guided aspiration/possible drain placement due to concern for renal cyst as infectious source.  Patient unable to participate in exam today due to somnolence/AMS, I have attempted to contact his wife 3 times so far with no answer - voicemail does not have identifying information so no message was left. I will continue to try to contact patient's wife Marco [name] and I have asked the floor RN to contact me if they are able to get in touch with her today.  Plan is to proceed with aspiration/possible drain placement today if possible once consent is obtained - patient to remain NPO/hold tube feeds, continue to hold SQ heparin (last dose today at 0527). Tmax 102.4, WBC 14.0, hgb 9.3, plt 316, INR 1.2 (5/21).   Thank you for this interesting consult.  I greatly enjoyed meeting Marco [name] and look forward to participating in their care.  A copy of this report was sent to the requesting provider on this date.  Electronically Signed: Joaquim Nam, [name] 01/01/2020, 11:29 AM  Pager# [phone]   I spent a  total of 40 Minutes  in face to face in clinical consultation, greater than 50% of which was counseling/coordinating care for right renal cyst aspiration/possible drain placement.

## 2020-01-01 NOTE — Procedures (Signed)
Intubation Procedure Note Marco [name] [id number] Mar 23, 1956  Procedure: Intubation Indications: Respiratory insufficiency  Procedure Details Consent: Risks of procedure as well as the alternatives and risks of each were explained to the (patient/caregiver).  Consent for procedure obtained.-Verbal informed emergent consent from the wife over the phone. Time Out: Verified patient identification, verified procedure, site/side was marked, verified correct patient position, special equipment/implants available, medications/allergies/relevent history reviewed, required imaging and test results available.  Performed  Maximum sterile technique was used including cap, gloves, gown, hand hygiene and mask.  MAC  Glide scope with MAC 4 blade grade 1 view after giving etomidate and rocuronium.  Bicarbonate was given preintubation because of significant respiratory rate breathing 40 times a minute.  Evaluation Hemodynamic Status: BP stable throughout; O2 sats: stable throughout Patient's Current Condition: stable Complications: No apparent complications Patient did tolerate procedure well. Chest X-ray ordered to verify placement.  CXR: tube position low-repostitioned.       SIGNATURE    Dr. Brand Males, M.D., F.C.C.P,  Pulmonary and Critical Care Medicine Staff Physician, [place] Director - Interstitial Lung Disease  Program  Pulmonary [place] [facility] [place], [place], [address]  Pager: [phone], If no answer or between  15:00h - 7:00h: call [phone] Telephone: [phone]  6:50 PM 01/01/2020

## 2020-01-01 NOTE — Progress Notes (Signed)
Abg obtained on the following vent settings: PRVC mode, VT=510, rr28, 100%, +5peep.  Results for Marco [name], Marco [name] (MRN [id number]) as of 01/01/2020 20:33  Ref. Range 01/01/2020 19:45  Delivery systems Unknown VENTILATOR  FIO2 Unknown 95.80  Mode Unknown PRESSURE REGULATED VOLUME CONTROL  VT Latest Units: mL 510  Peep/cpap Latest Units: cm H20 5.0  pH, Arterial Latest Ref Range: 7.350 - 7.450  7.53 (H)  pCO2 arterial Latest Ref Range: 32.0 - 48.0 mmHg 30.7 (L)  pO2, Arterial Latest Ref Range: 83.0 - 108.0 mmHg 107  Bicarbonate Latest Ref Range: 20.0 - 28.0 mmol/L 25.5  O2 Saturation Latest Units: % 98.2  Patient temperature Unknown 102.8  Collection site Unknown RIGHT RADIAL  Allens test (pass/fail) Latest Ref Range: PASS  PASS

## 2020-01-01 NOTE — Progress Notes (Signed)
    Updated progress note  -Throughout the course of the day patient had intermittent agitated delirium that was hard to control but would respond to increase Precedex to intermittent Versed.  Haldol was held off because of slightly prolonged QTC yesterday.  Subsequent conversation with urologist there is concern for right renal pole cyst abscess.  Patient underwent interventional radiology guided drainage of this abscess.  After return he had progressive worsening respiratory failure.  Chest x-ray showed pulmonary infiltrates.  Also had obtunded encephalopathy on Precedex.  He is breathing 45 times a minute and paradoxical.  Decision take to intubate  Post intubation patient will be given Lasix all labs will be ordered.  Sedation with propofol and fentanyl as needed and Versed as needed.  Will check all his labs and QTC   At this point concern is for acute lung injury superimposed on acute heart failure  Wife was updated over the phone preintubation.     ATTESTATION & SIGNATURE   The patient Marco [name] is critically ill with multiple organ systems failure and requires high complexity decision making for assessment and support, frequent evaluation and titration of therapies, application of advanced monitoring technologies and extensive interpretation of multiple databases.   Critical Care Time devoted to patient care services described in this note is 80  Minutes. This time reflects time of care of this signee Dr Brand Males. This critical care time does not reflect procedure time, or teaching time or supervisory time of PA/NP/Med student/Med Resident etc but could involve care discussion time     Dr. Brand Males, M.D., [facility].C.P Pulmonary and Critical Care Medicine Staff Physician [place] Pager: [phone], If no answer or between  15:00h - 7:00h: call [phone]  01/01/2020 6:54 PM

## 2020-01-01 NOTE — Progress Notes (Signed)
ELink RN notified that ETT found at 24cm lip, not 23cm lip.  ETT retracted 2cm, instead of 1.5cm and secured at 22cm lip.  Repeat CXR shows ETT in good position.

## 2020-01-01 NOTE — Progress Notes (Signed)
Subjective: [name] continues to do poorly with persistent fevers and MS changes despite aggressive antibiotic therapy.  He has a right renal cyst with evidence of inflammatory changes that weren't present on MRI in 1/21.  No other obvious source of infection has been found.  ROS:  Review of Systems  Unable to perform ROS: Mental status change    Anti-infectives: Anti-infectives (From admission, onward)   Start     Dose/Rate Route Frequency Ordered Stop   01/01/20 0400  vancomycin (VANCOCIN) IVPB 1000 mg/200 mL premix     1,000 mg 200 mL/hr over 60 Minutes Intravenous Every 24 hours 12/31/19 0920     12/31/19 1800  meropenem (MERREM) 1 g in sodium chloride 0.9 % 100 mL IVPB     1 g 200 mL/hr over 30 Minutes Intravenous Every 8 hours 12/31/19 1544     12/31/19 1530  meropenem (MERREM) injection 2 g  Status:  Discontinued     2 g Intramuscular Every 8 hours 12/31/19 1525 12/31/19 1543   12/30/19 0400  vancomycin (VANCOREADY) IVPB 750 mg/150 mL  Status:  Discontinued     750 mg 150 mL/hr over 60 Minutes Intravenous Every 12 hours 12/29/19 1340 12/31/19 0920   12/29/19 1430  ceFEPIme (MAXIPIME) 2 g in sodium chloride 0.9 % 100 mL IVPB  Status:  Discontinued     2 g 200 mL/hr over 30 Minutes Intravenous Every 8 hours 12/29/19 1340 12/31/19 1525   12/29/19 1400  vancomycin (VANCOREADY) IVPB 2000 mg/400 mL     2,000 mg 200 mL/hr over 120 Minutes Intravenous  Once 12/29/19 1340 12/29/19 1838   12/27/19 0730  cefTRIAXone (ROCEPHIN) 1 g in sodium chloride 0.9 % 100 mL IVPB  Status:  Discontinued     1 g 200 mL/hr over 30 Minutes Intravenous Every 24 hours 12/27/19 0714 12/29/19 1239      Current Facility-Administered Medications  Medication Dose Route Frequency Provider Last Rate Last Admin  . 0.9 %  sodium chloride infusion   Intravenous PRN Shelly Coss, MD   Stopped at 12/30/19 1700  . 0.9 %  sodium chloride infusion   Intravenous Continuous Brand Males, MD 10 mL/hr at  12/31/19 0904 Rate Change at 12/31/19 0904  . 0.9 %  sodium chloride infusion  250 mL Intravenous PRN Brand Males, MD      . acetaminophen (TYLENOL) suppository 325 mg  325 mg Rectal Q4H PRN Shelly Coss, MD   325 mg at 01/01/20 0341  . acetaminophen (TYLENOL) tablet 650 mg  650 mg Oral Q6H PRN Lang Snow, FNP   650 mg at 12/29/19 0535  . albuterol (PROVENTIL) (2.5 MG/3ML) 0.083% nebulizer solution 2.5 mg  2.5 mg Inhalation Q6H PRN Shelly Coss, MD   2.5 mg at 12/31/19 0900  . allopurinol (ZYLOPRIM) tablet 200 mg  200 mg Oral QPM Shelly Coss, MD   200 mg at 12/29/19 1757  . aspirin EC tablet 81 mg  81 mg Oral QHS Shelly Coss, MD   81 mg at 12/29/19 2147  . atorvastatin (LIPITOR) tablet 40 mg  40 mg Oral Daily Shelly Coss, MD   40 mg at 12/30/19 0816  . carvedilol (COREG) tablet 12.5 mg  12.5 mg Oral BID WC Shelly Coss, MD   12.5 mg at 12/30/19 0816  . Chlorhexidine Gluconate Cloth 2 % PADS 6 each  6 each Topical Daily Shelly Coss, MD   6 each at 12/31/19 1031  . dexmedetomidine (PRECEDEX) 400 MCG/100ML (4 mcg/mL) infusion  0.4-1.2 mcg/kg/hr Intravenous Titrated Brand Males, MD 21.7 mL/hr at 01/01/20 0954 0.8 mcg/kg/hr at 01/01/20 0954  . famotidine (PEPCID) IVPB 20 mg premix  20 mg Intravenous Q24H Brand Males, MD      . feeding supplement (PRO-STAT SUGAR FREE 64) liquid 30 mL  30 mL Oral BID Shelly Coss, MD   30 mL at 12/29/19 2015  . fluticasone (FLONASE) 50 MCG/ACT nasal spray 2 spray  2 spray Each Nare Daily Shelly Coss, MD   2 spray at 12/30/19 0817  . hydrALAZINE (APRESOLINE) injection 10 mg  10 mg Intravenous Q4H PRN Shelly Coss, MD   10 mg at 01/01/20 0109  . MEDLINE mouth rinse  15 mL Mouth Rinse BID Shelly Coss, MD   15 mL at 12/30/19 2201  . meropenem (MERREM) 1 g in sodium chloride 0.9 % 100 mL IVPB  1 g Intravenous Q8H Carlyle Basques, MD   Stopped at 01/01/20 0159  . multivitamin with minerals tablet 1 tablet  1 tablet  Oral Daily Shelly Coss, MD   1 tablet at 12/30/19 0816  . nutrition supplement (JUVEN) (JUVEN) powder packet 1 packet  1 packet Oral BID BM Shelly Coss, MD   1 packet at 12/30/19 0817  . polyethylene glycol (MIRALAX / GLYCOLAX) packet 17 g  17 g Oral Daily PRN Adhikari, Amrit, MD      . potassium chloride SA (KLOR-CON) CR tablet 20 mEq  20 mEq Oral Daily Shelly Coss, MD   20 mEq at 12/30/19 0816  . Racepinephrine HCl 2.25 % nebulizer solution 0.5 mL  0.5 mL Nebulization Once Adhikari, Amrit, MD      . sodium chloride flush (NS) 0.9 % injection 10-40 mL  10-40 mL Intracatheter Q12H Adhikari, Amrit, MD   10 mL at 12/31/19 1031  . sodium chloride flush (NS) 0.9 % injection 10-40 mL  10-40 mL Intracatheter PRN Adhikari, Amrit, MD      . sodium chloride flush (NS) 0.9 % injection 3 mL  3 mL Intravenous Q12H Brand Males, MD   3 mL at 12/31/19 2114  . sodium chloride flush (NS) 0.9 % injection 3 mL  3 mL Intravenous PRN Brand Males, MD      . vancomycin (VANCOCIN) IVPB 1000 mg/200 mL premix  1,000 mg Intravenous Q24H Angela [facility], [facility]   Stopped at 01/01/20 0447     Objective: Vital signs in last 24 hours: Temp:  [98 F (36.7 C)-102.4 F (39.1 C)] 98.3 F (36.8 C) (05/23 0800) Pulse Rate:  [53-93] 63 (05/23 0800) Resp:  [15-25] 17 (05/23 0800) BP: (103-178)/(49-115) 148/66 (05/23 0800) SpO2:  [93 %-100 %] 100 % (05/23 0803) FiO2 (%):  [28 %] 28 % (05/22 1533)  Intake/Output from previous day: 05/22 0701 - 05/23 0700 In: 635.6 [I.V.:239.9; IV Piggyback:395.8] Out: 2301 [Urine:2300; Stool:1] Intake/Output this shift: No intake/output data recorded.   Physical Exam Vitals reviewed.  Constitutional:      Appearance: He is ill-appearing.  Abdominal:     Palpations: Abdomen is soft.     Tenderness: There is abdominal tenderness (> in RUQ and flank). There is guarding.  Neurological:     General: No focal deficit present.     Comments: arousable but A/O x 0 and  hallucinating.       Lab Results:  Recent Labs    12/31/19 1203 01/01/20 0750  WBC 15.0* 14.0*  HGB 8.8* 9.3*  HCT 28.3* 30.1*  PLT 284 316   BMET Recent Labs  12/31/19 0230 12/31/19 1203  NA 142 143  K 3.9 4.0  CL 110 109  CO2 23 25  GLUCOSE 100* 120*  BUN 18 22  CREATININE 1.65* 1.73*  CALCIUM 8.1* 7.7*   PT/INR Recent Labs    12/30/19 1106  LABPROT 15.0  INR 1.2   ABG Recent Labs    12/31/19 0908  PHART 7.430  HCO3 19.8*    Studies/Results: MR BRAIN WO CONTRAST  Result Date: 12/30/2019 CLINICAL DATA:  Encephalopathy.  Sepsis. EXAM: MRI HEAD WITHOUT CONTRAST TECHNIQUE: Multiplanar, multiecho pulse sequences of the brain and surrounding structures were obtained without intravenous contrast. COMPARISON:  Head CT 12/27/2019 FINDINGS: Brain: Diffusion imaging does not show any acute or subacute infarction or other cause of restricted diffusion. No focal abnormality affects the brainstem or cerebellum. Cerebral hemispheres show minimal small vessel change of the white matter. No cortical or large vessel territory infarction. No mass lesion, hemorrhage, hydrocephalus or extra-axial collection. Vascular: Major vessels at the base of the brain show flow. Skull and upper cervical spine: Negative Sinuses/Orbits: Opacification of the left maxillary sinus, the sphenoid sinus and some of the left ethmoid sinuses. Internal material is hypointense on T2 imaging. This could be chronic inspissated mucus or chronic fungal sinusitis. Other: None IMPRESSION: No significant intracranial finding. No acute infarction or other acute process. Sinus disease primarily affecting the left maxillary and sphenoid sinus. The sinuses could contain chronic inspissated mucus or could be affected by fungal sinusitis. Electronically Signed   By: Nelson Chimes M.D.   On: 12/30/2019 15:34   DG CHEST PORT 1 VIEW  Result Date: 12/31/2019 CLINICAL DATA:  64 year old male with a history of shortness of  breath EXAM: PORTABLE CHEST 1 VIEW COMPARISON:  12/29/2019 FINDINGS: Cardiomediastinal silhouette unchanged with cardiomegaly. Interlobular septal thickening. Mild patchy airspace opacity in the lower lungs slightly increased from the comparison. No pneumothorax or pleural effusion. IMPRESSION: Evidence of early pulmonary edema and basilar atelectasis. Electronically Signed   By: Corrie Mckusick D.O.   On: 12/31/2019 09:43   ECHOCARDIOGRAM COMPLETE  Result Date: 12/31/2019    ECHOCARDIOGRAM REPORT   Patient Name:   Marco [name] Date of Exam: 12/31/2019 Medical Rec #:  [id number]     Height:       66.0 in Accession #:    [id number]    Weight:       238.8 lb Date of Birth:  1955/11/16     BSA:          2.156 m Patient Age:    63 years      BP:           180/78 mmHg Patient Gender: M             HR:           95 bpm. Exam Location:  Inpatient Procedure: 2D Echo Indications:    fever 780.6  History:        Patient has prior history of Echocardiogram examinations. CHF,                 CAD, Arrythmias:Atrial Fibrillation; Risk Factors:Diabetes,                 Hypertension and Former Smoker.  Sonographer:    Jannett Celestine RDCS (AE) Referring Phys: [id number] [facility]  Sonographer Comments: Image acquisition challenging due to respiratory motion. restricted mobility. patient on BiPap machine during exam IMPRESSIONS  1. Mild hypokinesis of the distal septum and apex. Left ventricular  ejection fraction, by estimation, is 50 to 55%. The left ventricle has low normal function. The left ventricle demonstrates regional wall motion abnormalities (see scoring diagram/findings for description). There is mild concentric left ventricular hypertrophy. Left ventricular diastolic parameters are consistent with Grade I diastolic dysfunction (impaired relaxation).  2. Right ventricular systolic function is normal. The right ventricular size is normal.  3. Right atrial size was mildly dilated.  4. The mitral valve is normal in  structure. No evidence of mitral valve regurgitation. No evidence of mitral stenosis.  5. The aortic valve is tricuspid. Aortic valve regurgitation is not visualized. No aortic stenosis is present.  6. The inferior vena cava is normal in size with greater than 50% respiratory variability, suggesting right atrial pressure of 3 mmHg. FINDINGS  Left Ventricle: Mild hypokinesis of the distal septum and apex. Left ventricular ejection fraction, by estimation, is 50 to 55%. The left ventricle has low normal function. The left ventricle demonstrates regional wall motion abnormalities. The left ventricular internal cavity size was normal in size. There is mild concentric left ventricular hypertrophy. Left ventricular diastolic parameters are consistent with Grade I diastolic dysfunction (impaired relaxation). Indeterminate filling pressures. Right Ventricle: The right ventricular size is normal. No increase in right ventricular wall thickness. Right ventricular systolic function is normal. Left Atrium: Left atrial size was normal in size. Right Atrium: Right atrial size was mildly dilated. Pericardium: There is no evidence of pericardial effusion. Mitral Valve: The mitral valve is normal in structure. Normal mobility of the mitral valve leaflets. No evidence of mitral valve regurgitation. No evidence of mitral valve stenosis. Tricuspid Valve: The tricuspid valve is normal in structure. Tricuspid valve regurgitation is trivial. No evidence of tricuspid stenosis. Aortic Valve: The aortic valve is tricuspid. Aortic valve regurgitation is not visualized. No aortic stenosis is present. Pulmonic Valve: The pulmonic valve was normal in structure. Pulmonic valve regurgitation is not visualized. No evidence of pulmonic stenosis. Aorta: The aortic root is normal in size and structure. Venous: The inferior vena cava is normal in size with greater than 50% respiratory variability, suggesting right atrial pressure of 3 mmHg. IAS/Shunts:  No atrial level shunt detected by color flow Doppler.  LEFT VENTRICLE PLAX 2D LVIDd:         5.30 cm  Diastology LVIDs:         3.70 cm  LV e' lateral:   6.20 cm/s LV PW:         1.20 cm  LV E/e' lateral: 12.3 LV IVS:        1.20 cm  LV e' medial:    5.77 cm/s LVOT diam:     2.20 cm  LV E/e' medial:  13.2 LV SV:         64 LV SV Index:   30 LVOT Area:     3.80 cm  RIGHT VENTRICLE RV S prime:     15.30 cm/s TAPSE (M-mode): 2.0 cm LEFT ATRIUM             Index       RIGHT ATRIUM           Index LA diam:        3.20 cm 1.48 cm/m  RA Area:     23.10 cm LA Vol (A2C):   49.6 ml 23.00 ml/m RA Volume:   70.00 ml  32.47 ml/m LA Vol (A4C):   45.3 ml 21.01 ml/m LA Biplane Vol: 51.4 ml 23.84 ml/m  AORTIC VALVE LVOT Vmax:  112.00 cm/s LVOT Vmean:  83.800 cm/s LVOT VTI:    0.168 m  AORTA Ao Root diam: 3.20 cm MITRAL VALVE               TRICUSPID VALVE MV Area (PHT): 3.77 cm    TR Peak grad:   28.3 mmHg MV Decel Time: 201 msec    TR Vmax:        266.00 cm/s MV E velocity: 76.30 cm/s MV A velocity: 87.80 cm/s  SHUNTS MV E/A ratio:  0.87        Systemic VTI:  0.17 m                            Systemic Diam: 2.20 cm Skeet Latch MD Electronically signed by Skeet Latch MD Signature Date/Time: 12/31/2019/12:49:48 PM    Final    I have discussed the case with Dr. Chase Caller and reviewed the pertinent records, labs and imaging.   Assessment and Plan: Sepsis with a history of e. Coli pyelonephritis and a probable infected right renal cyst.  I have contacted Dr. Anselm Pancoast in IR who will get him set up for cyst aspiration and drainage today.   Heparin discontinued.       LOS: 5 days    Irine Seal 01/01/2020 [id number] ID: [name], male   DOB: 24-Nov-1955, 64 y.o.   MRN: [id number]

## 2020-01-01 NOTE — Progress Notes (Signed)
Pt still remains off BIPAP at this time.

## 2020-01-01 NOTE — Progress Notes (Addendum)
eLink Physician-Brief Progress Note Patient Name: Marco [name] DOB: April 14, 1956 MRN: [id number]   Date of Service  01/01/2020  HPI/Events of Note  RN reported that based on post intubation CXR, the ETT had already been withdrawn Also fever, recent cultures sent and is on antibiotics EKG with prolonged Qtc, also noted before  eICU Interventions  CXR, cooling blanket, magnesium ordered Troponin/BNP pending, ABG pending Asked RN to let us know when labs done, will supplement mag if it is low Otherwise patient stable, a little hypertensive, while on PRVC VT 510, RR 28, peep 5, Fio2 100     Intervention Category Major Interventions: Infection - evaluation and management Evaluation Type: Other  [name] 01/01/2020, 8:06 PM   Addendum Repeat CXR showed ETT in good position ABG noted Fio2 already down to 80, O2 sat 99 Will wean and lower VT as patient is alkalotic as well Repeat ABG at 3 am  Call us with results

## 2020-01-01 NOTE — Progress Notes (Signed)
RT took pt off BIPAP and placed on 2 LPM [place]. No distress noted at this time.

## 2020-01-01 NOTE — Consult Note (Signed)
NAME:  Marco [name], MRN:  [id number], DOB:  06-09-1956, LOS: 5 ADMISSION DATE:  12/27/2019, CONSULTATION DATE:  12/31/19 REFERRING MD:  Dr Shelly Coss, CHIEF COMPLAINT:  Acute resp failure and acute encephhalopathy  Brief History     64 year old male with bilateral testicular atrophy and hypogonadism, incidental right upper pole renal cyst and ultrasound 2016 obesity, OSA?  Using CPAP diabetes, gout on allopurinol, anxiety on duloxetine and lorazepam, atrial fibrillation status post ablation in [place] in 2017, hypertension, chronic kidney disease stage III and nonischemic systolic and diastolic cardiomyopathy [EF 35-40%] with admission in 2019 for acute on chronic systolic and diastolic heart failure requiring significant diuresis.  Although this had improved by March 2021 to a year 55/60%.  Home cardiac medications included aspirin 81 mg, Lipitor Coreg, Imdur, hydralazine, Entresto and Lasix.  Also noted to be on tafamidis [given in amyloidosis related cardiomyopathy] also with diabetic neuropathy on Lyrica.  Psoriatic arthritis on daily prednisone and oxycodone and Cosentyx.  Despite all this at baseline he is physically active ambulates without any problems.   He was then admitted December 09, 2019 for 5 days and discharged Dec 13, 2019 with a diagnosis of severe sepsis due to right kidney pyelonephritisWith urine growing multidrug-resistant E. coli and acute kidney injury with creatinine changing from a baseline of 1.5 mg percent to a peak of 5.6 mg percent and returning to baseline at the time of discharge.  Course was complicated by acute metabolic encephalopathy sepsis and hypoglycemia related that was improving slowly.  CT head was negative for acute process.    He then got readmitted 2 weeks later on Dec 27 2019 with weakness and lethargy with continued urinary frequency, dysuria and incontinence and diffuse joint pain.  Repeat urine culture has been negative found to be febrile on admission  with elevated lactic acid treated with fluids.  Antibiotics were initiated but despite antibiotic started having continued fevers and mild leukocytosis.[name]  His CT on Dec 28, 2019 showed 4 cm exophytic right upper pole renal cyst with more stranding.  On Dec 29, 2019: Urology consulted and felt to have low suspicion for renal cell carcinoma but persistent pyelonephritis with possible translocation of bacteria to cyst which is now infected and recommendations to follow with abdominal CT. On Dec 30, 2019: Infectious disease consulted -with recommendation for lumbar puncture which could not be done.  Then on Dec 31, 2019-overnight transferred to the ICU because of hypoxemia and confusion.  And then abrupt stridor/wheeze and respiratory distress with ongoing confusion that did not respond to racemic epinephrine but responded subsequently to BiPAP, Precedex and Lasix under critical care supervision.   Past Medical History    PSA 1.2 (10/20/2018)  has a past medical history of Benign colon polyp (08/01/2013), Chronic combined systolic and diastolic CHF (congestive heart failure) ([place]), Chronic pain, CKD (chronic kidney disease), stage III, Diabetes mellitus without complication ([place]), Diverticulosis, Esophageal hiatal hernia (07/29/2013), Esophageal stricture, Essential hypertension, Gastritis (07/29/2013), GERD (gastroesophageal reflux disease), Morbid obesity ([place]), Non-obstructive CAD, Persistent atrial fibrillation ([place]), Rheumatoid arthritis ([place]), and Sigmoid diverticulosis (08/01/2013).   reports that he quit smoking about 11 years ago. His smoking use included cigarettes. He has a 5.00 pack-year smoking history. He has never used smokeless tobacco.   has a past surgical history that includes Colon surgery (09/2014); Cardiac catheterization (05/2014); Colon resection (09/2013); Cardiac catheterization (N/A, 11/16/2015); and LEFT HEART CATH AND CORONARY ANGIOGRAPHY (N/A, 05/19/2018).    Significant  Hospital Events   12/27/2019 -  admit Dec 29, 2019: Urology consult - feel rigth renal cyst is source of current sepsis May 21st 2021: Infectious disease consult 5.22.21 - ccm consult.  5/23 - IR consult  Consults:  x  Procedures:  x  Significant Diagnostic Tests:  x  Micro Data:     ABX  Ceftriaxone 5/18 - 5/20 vanc 5/20 Merrem 5/22       Interim history/subjective:   01/01/2020  -did not respond to Lasix and BiPAP yesterday.  BNP is quite high.  Came off BiPAP successfully this morning.  Intermittently very confused and agitated needing escalating doses of Precedex and also extremely low-dose of Versed as needed to maintain calm.  No one has been able to get hold of his wife.  Seen by urology who strongly feel that the right renal cyst needs to be drained.  Interventional radiology consult has been called.  Continues to have spiking fevers and persistent high white count despite antibiotic therapy \  Echocardiogram 12/31/2019: Mildly reduced left ventricular ejection fraction 50-55% with asymmetric wall motion abnormalities.  Grade 1 diastolic dysfunction. QTC marginally elevated in the setting of Precedex.  Being monitored closely.  Protonix changed to Pepcid as a result.  Creat rising to 1.7mg %  Objective   Blood pressure (!) 148/66, pulse 63, temperature 98.3 F (36.8 C), temperature source Oral, resp. rate 17, height 5\' 6"  (1.676 m), weight 108.3 kg, SpO2 100 %.    FiO2 (%):  [28 %] 28 %   Intake/Output Summary (Last 24 hours) at 01/01/2020 1207 Last data filed at 01/01/2020 0400 Gross per 24 hour  Intake 499.53 ml  Output 2301 ml  Net -1801.47 ml   Filed Weights   12/26/19 2321 12/27/19 1254  Weight: 110.2 kg 108.3 kg     Examination:   Resolved Hospital Problem list   x  Assessment & Plan:   Chronic OSA-had been refusing CPAP in the floor Acute respiratory failure hypoxemia - 01/01/20 -likely due to acute pulmonary edema in the setting of high BNP and  mildly low ejection fraction and diastolic dysfunction and well known past medical history.  Seem to respond to BiPAP and Lasix.  Doubt pulmonary embolism in the setting of heparin for DVT prophylaxis  Plan -BiPAP nightly and daytime as needed -Lasix x1 -Cut down fluids/make KVO or saline lock -Intubate if gets worse -Get Doppler lower extremity   Acute agitated encephalopathy   01/01/2020: Dependent on Precedex.  Needing higher doses of Precedex.  Need an extremely low-dose of Versed to maintain calm  Plan -Low-dose Versed as needed -Precedex infusion - RASS goal 0 to -2 - avoid ativan  2019 cardiac cath with nonischemic systolic heart failure 0 ejection fraction 25%  - .  March 2021 echo with diastolic dysfunction and normal systolic function  0000000: Somewhat improved pulmonary edema.  Able to come off BiPAP.  Ejection fraction on echo 50-55%.  BNP high.  Troponin barely elevated consistent with stress  Plan -Lasix x1 = meds - asa, coreg, lipitor   Persistent sepsis with fevers in the setting of E. coli multidrug-resistant UTI end of April 2021 and right pyelonephritis.  Urology concerned about abscess and right renal cyst.  Infectious disease following  01/01/2020: Ongoing fevers.  On Vanco and Merrem by infectious diseases.  Urology strongly recommending drainage of the right renal cyst.  Seen by interventional radiology  Plan -Antibiotics per primary service and infectious diseases  -Await IR guided drainage of the right renal cyst  Anemia chronic diseaes  and ccm illness  01/01/2020 - no overt bleed  Plan - PRBC for hgb </= 6.9gm%    - exceptions are   -  if ACS susepcted/confirmed then transfuse for hgb </= 8.0gm%,  or    -  active bleeding with hemodynamic instability, then transfuse regardless of hemoglobin value   At at all times try to transfuse 1 unit prbc as possible with exception of active hemorrhage    Gout  Plan  - cotninue allopurinol -> ? Dose  adjustment needed with AKI    Best practice:  Diet: N.p.o. Pain/Anxiety/Delirium protocol (if indicated): precedex VAP protocol (if indicated): hob DVT prophylaxis: heparin sq GI prophylaxis: protonix iv Glucose control: ssi Mobility: bed rest Code Status: full code Family Communication: no one has been able to reach wife 01/01/20 Disposition: ICU .- ccm primary   ATTESTATION & SIGNATURE   The patient Marco [name] is critically ill with multiple organ systems failure and requires high complexity decision making for assessment and support, frequent evaluation and titration of therapies, application of advanced monitoring technologies and extensive interpretation of multiple databases.   Critical Care Time devoted to patient care services described in this note is  32  Minutes. This time reflects time of care of this signee Dr Brand Males. This critical care time does not reflect procedure time, or teaching time or supervisory time of PA/NP/Med student/Med Resident etc but could involve care discussion time     Dr. Brand Males, M.D., [facility].C.P Pulmonary and Critical Care Medicine Staff Physician [place] [facility] Pager: [phone], If no answer or between  15:00h - 7:00h: call [phone]  01/01/2020 12:45 PM   LABS    PULMONARY Recent Labs  Lab 12/31/19 0908  PHART 7.430  PCO2ART 30.4*  PO2ART 93.3  HCO3 19.8*  O2SAT 96.3    CBC Recent Labs  Lab 12/31/19 0230 12/31/19 1203 01/01/20 0750  HGB 9.4* 8.8* 9.3*  HCT 30.3* 28.3* 30.1*  WBC 15.8* 15.0* 14.0*  PLT 278 284 316    COAGULATION Recent Labs  Lab 12/30/19 1106  INR 1.2    CARDIAC  No results for input(s): TROPONINI in the last 168 hours. No results for input(s): PROBNP in the last 168 hours.   CHEMISTRY Recent Labs  Lab 12/28/19 0440 12/28/19 0440 12/29/19 0526 12/29/19 0526 12/30/19 1106 12/30/19 1106 12/31/19 0230 12/31/19 1203    NA 141  --  138  --  140  --  142 143  K 4.1   < > 4.5   < > 4.2   < > 3.9 4.0  CL 103  --  104  --  105  --  110 109  CO2 28  --  25  --  24  --  23 25  GLUCOSE 116*  --  91  --  113*  --  100* 120*  BUN 14  --  12  --  17  --  18 22  CREATININE 1.56*  --  1.48*  --  1.51*  --  1.65* 1.73*  CALCIUM 7.9*  --  8.0*  --  8.2*  --  8.1* 7.7*  MG  --   --   --   --   --   --   --  2.1  PHOS  --   --   --   --   --   --   --  2.8   < > =  values in this interval not displayed.   Estimated Creatinine Clearance: 49.8 mL/min (A) (by C-G formula based on SCr of 1.73 mg/dL (H)).   LIVER Recent Labs  Lab 12/30/19 1106 12/31/19 1203  AST  --  16  ALT  --  17  ALKPHOS  --  50  BILITOT  --  1.0  PROT  --  6.7  ALBUMIN  --  2.2*  INR 1.2  --      INFECTIOUS Recent Labs  Lab 12/27/19 0730 12/27/19 1008 12/31/19 1203 01/01/20 0750  LATICACIDVEN 2.5* 1.1 1.0  --   PROCALCITON  --   --  0.30 0.35     ENDOCRINE CBG (last 3)  No results for input(s): GLUCAP in the last 72 hours.       IMAGING x48h  - image(s) personally visualized  -   highlighted in bold MR BRAIN WO CONTRAST  Result Date: 12/30/2019 CLINICAL DATA:  Encephalopathy.  Sepsis. EXAM: MRI HEAD WITHOUT CONTRAST TECHNIQUE: Multiplanar, multiecho pulse sequences of the brain and surrounding structures were obtained without intravenous contrast. COMPARISON:  Head CT 12/27/2019 FINDINGS: Brain: Diffusion imaging does not show any acute or subacute infarction or other cause of restricted diffusion. No focal abnormality affects the brainstem or cerebellum. Cerebral hemispheres show minimal small vessel change of the white matter. No cortical or large vessel territory infarction. No mass lesion, hemorrhage, hydrocephalus or extra-axial collection. Vascular: Major vessels at the base of the brain show flow. Skull and upper cervical spine: Negative Sinuses/Orbits: Opacification of the left maxillary sinus, the sphenoid sinus and  some of the left ethmoid sinuses. Internal material is hypointense on T2 imaging. This could be chronic inspissated mucus or chronic fungal sinusitis. Other: None IMPRESSION: No significant intracranial finding. No acute infarction or other acute process. Sinus disease primarily affecting the left maxillary and sphenoid sinus. The sinuses could contain chronic inspissated mucus or could be affected by fungal sinusitis. Electronically Signed   By: Nelson Chimes M.D.   On: 12/30/2019 15:34   DG CHEST PORT 1 VIEW  Result Date: 12/31/2019 CLINICAL DATA:  64 year old male with a history of shortness of breath EXAM: PORTABLE CHEST 1 VIEW COMPARISON:  12/29/2019 FINDINGS: Cardiomediastinal silhouette unchanged with cardiomegaly. Interlobular septal thickening. Mild patchy airspace opacity in the lower lungs slightly increased from the comparison. No pneumothorax or pleural effusion. IMPRESSION: Evidence of early pulmonary edema and basilar atelectasis. Electronically Signed   By: Corrie Mckusick D.O.   On: 12/31/2019 09:43   ECHOCARDIOGRAM COMPLETE  Result Date: 12/31/2019    ECHOCARDIOGRAM REPORT   Patient Name:   TYWUAN TAKUSHI Date of Exam: 12/31/2019 Medical Rec #:  [id number]     Height:       66.0 in Accession #:    [id number]    Weight:       238.8 lb Date of Birth:  [birth year]     BSA:          2.156 m Patient Age:    14 years      BP:           180/78 mmHg Patient Gender: M             HR:           95 bpm. Exam Location:  Inpatient Procedure: 2D Echo Indications:    fever 780.6  History:        Patient has prior history of Echocardiogram examinations. CHF,  CAD, Arrythmias:Atrial Fibrillation; Risk Factors:Diabetes,                 Hypertension and Former Smoker.  Sonographer:    Jannett Celestine RDCS (AE) Referring Phys: [id number] [facility]  Sonographer Comments: Image acquisition challenging due to respiratory motion. restricted mobility. patient on BiPap machine during exam IMPRESSIONS  1. Mild  hypokinesis of the distal septum and apex. Left ventricular ejection fraction, by estimation, is 50 to 55%. The left ventricle has low normal function. The left ventricle demonstrates regional wall motion abnormalities (see scoring diagram/findings for description). There is mild concentric left ventricular hypertrophy. Left ventricular diastolic parameters are consistent with Grade I diastolic dysfunction (impaired relaxation).  2. Right ventricular systolic function is normal. The right ventricular size is normal.  3. Right atrial size was mildly dilated.  4. The mitral valve is normal in structure. No evidence of mitral valve regurgitation. No evidence of mitral stenosis.  5. The aortic valve is tricuspid. Aortic valve regurgitation is not visualized. No aortic stenosis is present.  6. The inferior vena cava is normal in size with greater than 50% respiratory variability, suggesting right atrial pressure of 3 mmHg. FINDINGS  Left Ventricle: Mild hypokinesis of the distal septum and apex. Left ventricular ejection fraction, by estimation, is 50 to 55%. The left ventricle has low normal function. The left ventricle demonstrates regional wall motion abnormalities. The left ventricular internal cavity size was normal in size. There is mild concentric left ventricular hypertrophy. Left ventricular diastolic parameters are consistent with Grade I diastolic dysfunction (impaired relaxation). Indeterminate filling pressures. Right Ventricle: The right ventricular size is normal. No increase in right ventricular wall thickness. Right ventricular systolic function is normal. Left Atrium: Left atrial size was normal in size. Right Atrium: Right atrial size was mildly dilated. Pericardium: There is no evidence of pericardial effusion. Mitral Valve: The mitral valve is normal in structure. Normal mobility of the mitral valve leaflets. No evidence of mitral valve regurgitation. No evidence of mitral valve stenosis. Tricuspid  Valve: The tricuspid valve is normal in structure. Tricuspid valve regurgitation is trivial. No evidence of tricuspid stenosis. Aortic Valve: The aortic valve is tricuspid. Aortic valve regurgitation is not visualized. No aortic stenosis is present. Pulmonic Valve: The pulmonic valve was normal in structure. Pulmonic valve regurgitation is not visualized. No evidence of pulmonic stenosis. Aorta: The aortic root is normal in size and structure. Venous: The inferior vena cava is normal in size with greater than 50% respiratory variability, suggesting right atrial pressure of 3 mmHg. IAS/Shunts: No atrial level shunt detected by color flow Doppler.  LEFT VENTRICLE PLAX 2D LVIDd:         5.30 cm  Diastology LVIDs:         3.70 cm  LV e' lateral:   6.20 cm/s LV PW:         1.20 cm  LV E/e' lateral: 12.3 LV IVS:        1.20 cm  LV e' medial:    5.77 cm/s LVOT diam:     2.20 cm  LV E/e' medial:  13.2 LV SV:         64 LV SV Index:   30 LVOT Area:     3.80 cm  RIGHT VENTRICLE RV S prime:     15.30 cm/s TAPSE (M-mode): 2.0 cm LEFT ATRIUM             Index       RIGHT ATRIUM  Index LA diam:        3.20 cm 1.48 cm/m  RA Area:     23.10 cm LA Vol (A2C):   49.6 ml 23.00 ml/m RA Volume:   70.00 ml  32.47 ml/m LA Vol (A4C):   45.3 ml 21.01 ml/m LA Biplane Vol: 51.4 ml 23.84 ml/m  AORTIC VALVE LVOT Vmax:   112.00 cm/s LVOT Vmean:  83.800 cm/s LVOT VTI:    0.168 m  AORTA Ao Root diam: 3.20 cm MITRAL VALVE               TRICUSPID VALVE MV Area (PHT): 3.77 cm    TR Peak grad:   28.3 mmHg MV Decel Time: 201 msec    TR Vmax:        266.00 cm/s MV E velocity: 76.30 cm/s MV A velocity: 87.80 cm/s  SHUNTS MV E/A ratio:  0.87        Systemic VTI:  0.17 m                            Systemic Diam: 2.20 cm Skeet Latch MD Electronically signed by Skeet Latch MD Signature Date/Time: 12/31/2019/12:49:48 PM    Final

## 2020-01-02 ENCOUNTER — Inpatient Hospital Stay (HOSPITAL_COMMUNITY): Payer: Medicaid Other

## 2020-01-02 ENCOUNTER — Inpatient Hospital Stay (HOSPITAL_COMMUNITY)
Admit: 2020-01-02 | Discharge: 2020-01-02 | Disposition: A | Discharge status: Home / Self Care | Payer: Medicaid Other | Attending: Internal Medicine | Admitting: Internal Medicine

## 2020-01-02 DIAGNOSIS — R4182 Altered mental status, unspecified: Secondary | ICD-10-CM

## 2020-01-02 DIAGNOSIS — D72829 Elevated white blood cell count, unspecified: Secondary | ICD-10-CM

## 2020-01-02 DIAGNOSIS — N281 Cyst of kidney, acquired: Secondary | ICD-10-CM

## 2020-01-02 LAB — GLUCOSE, CAPILLARY
Glucose-Capillary: 103 mg/dL — ABNORMAL HIGH (ref 70–99)
Glucose-Capillary: 112 mg/dL — ABNORMAL HIGH (ref 70–99)
Glucose-Capillary: 118 mg/dL — ABNORMAL HIGH (ref 70–99)
Glucose-Capillary: 128 mg/dL — ABNORMAL HIGH (ref 70–99)
Glucose-Capillary: 176 mg/dL — ABNORMAL HIGH (ref 70–99)
Glucose-Capillary: 184 mg/dL — ABNORMAL HIGH (ref 70–99)

## 2020-01-02 LAB — CBC WITH DIFFERENTIAL/PLATELET
Abs Immature Granulocytes: 0.15 10*3/uL — ABNORMAL HIGH (ref 0.00–0.07)
Basophils Absolute: 0 10*3/uL (ref 0.0–0.1)
Basophils Relative: 0 %
Eosinophils Absolute: 0.4 10*3/uL (ref 0.0–0.5)
Eosinophils Relative: 3 %
HCT: 32.4 % — ABNORMAL LOW (ref 39.0–52.0)
Hemoglobin: 10.2 g/dL — ABNORMAL LOW (ref 13.0–17.0)
Immature Granulocytes: 1 %
Lymphocytes Relative: 14 %
Lymphs Abs: 2.3 10*3/uL (ref 0.7–4.0)
MCH: 29.2 pg (ref 26.0–34.0)
MCHC: 31.5 g/dL (ref 30.0–36.0)
MCV: 92.8 fL (ref 80.0–100.0)
Monocytes Absolute: 2.5 10*3/uL — ABNORMAL HIGH (ref 0.1–1.0)
Monocytes Relative: 15 %
Neutro Abs: 11 10*3/uL — ABNORMAL HIGH (ref 1.7–7.7)
Neutrophils Relative %: 67 %
Platelets: 378 10*3/uL (ref 150–400)
RBC: 3.49 MIL/uL — ABNORMAL LOW (ref 4.22–5.81)
RDW: 16.6 % — ABNORMAL HIGH (ref 11.5–15.5)
WBC: 16.4 10*3/uL — ABNORMAL HIGH (ref 4.0–10.5)
nRBC: 0 % (ref 0.0–0.2)

## 2020-01-02 LAB — PHOSPHORUS
Phosphorus: 1.9 mg/dL — ABNORMAL LOW (ref 2.5–4.6)
Phosphorus: 1.9 mg/dL — ABNORMAL LOW (ref 2.5–4.6)

## 2020-01-02 LAB — COMPREHENSIVE METABOLIC PANEL
ALT: 30 U/L (ref 0–44)
AST: 37 U/L (ref 15–41)
Albumin: 2.6 g/dL — ABNORMAL LOW (ref 3.5–5.0)
Alkaline Phosphatase: 58 U/L (ref 38–126)
Anion gap: 12 (ref 5–15)
BUN: 33 mg/dL — ABNORMAL HIGH (ref 8–23)
CO2: 28 mmol/L (ref 22–32)
Calcium: 8.1 mg/dL — ABNORMAL LOW (ref 8.9–10.3)
Chloride: 108 mmol/L (ref 98–111)
Creatinine, Ser: 2.09 mg/dL — ABNORMAL HIGH (ref 0.61–1.24)
GFR calc Af Amer: 38 mL/min — ABNORMAL LOW (ref >60)
GFR calc non Af Amer: 32 mL/min — ABNORMAL LOW (ref >60)
Glucose, Bld: 135 mg/dL — ABNORMAL HIGH (ref 70–99)
Potassium: 3.6 mmol/L (ref 3.5–5.1)
Sodium: 148 mmol/L — ABNORMAL HIGH (ref 135–145)
Total Bilirubin: 1.2 mg/dL (ref 0.3–1.2)
Total Protein: 7.3 g/dL (ref 6.5–8.1)

## 2020-01-02 LAB — MAGNESIUM
Magnesium: 2 mg/dL (ref 1.7–2.4)
Magnesium: 2.1 mg/dL (ref 1.7–2.4)

## 2020-01-02 LAB — BLOOD GAS, ARTERIAL
Acid-Base Excess: 2 mmol/L (ref 0.0–2.0)
Bicarbonate: 24.6 mmol/L (ref 20.0–28.0)
FIO2: 50
MECHVT: 450 mL
O2 Saturation: 97.1 %
PEEP: 5 cmH2O
Patient temperature: 103.5
RATE: 28 resp/min
pCO2 arterial: 36.9 mmHg (ref 32.0–48.0)
pH, Arterial: 7.453 — ABNORMAL HIGH (ref 7.350–7.450)
pO2, Arterial: 110 mmHg — ABNORMAL HIGH (ref 83.0–108.0)

## 2020-01-02 LAB — TRIGLYCERIDES: Triglycerides: 164 mg/dL — ABNORMAL HIGH (ref <150)

## 2020-01-02 LAB — TROPONIN I (HIGH SENSITIVITY): Troponin I (High Sensitivity): 80 ng/L — ABNORMAL HIGH (ref <18)

## 2020-01-02 LAB — PROCALCITONIN: Procalcitonin: 5.11 ng/mL

## 2020-01-02 MED ORDER — POTASSIUM PHOSPHATES 15 MMOLE/5ML IV SOLN
10.0000 mmol | Freq: Once | INTRAVENOUS | Status: AC
  Administered 2020-01-02: 10 mmol via INTRAVENOUS
  Filled 2020-01-02: qty 3.33

## 2020-01-02 MED ORDER — PRO-STAT SUGAR FREE PO LIQD: 30.0000 mL | Freq: Every day | ORAL | Status: DC

## 2020-01-02 MED ORDER — ATORVASTATIN CALCIUM 40 MG PO TABS: 40.0000 mg | ORAL_TABLET | Freq: Every day | ORAL | Status: DC

## 2020-01-02 MED ORDER — CARVEDILOL 12.5 MG PO TABS
12.5000 mg | ORAL_TABLET | Freq: Two times a day (BID) | ORAL | Status: DC
  Administered 2020-01-02: 12.5 mg
  Filled 2020-01-02: qty 1

## 2020-01-02 MED ORDER — GADOBUTROL 1 MMOL/ML IV SOLN
10.0000 mL | Freq: Once | INTRAVENOUS | Status: AC | PRN
  Administered 2020-01-02: 10 mL via INTRAVENOUS

## 2020-01-02 MED ORDER — ADULT MULTIVITAMIN LIQUID CH
15.0000 mL | Freq: Every day | ORAL | Status: DC
  Administered 2020-01-02: 15 mL via ORAL
  Filled 2020-01-02: qty 15

## 2020-01-02 MED ORDER — HEPARIN SODIUM (PORCINE) 5000 UNIT/ML IJ SOLN
5000.0000 [IU] | Freq: Three times a day (TID) | INTRAMUSCULAR | Status: DC
  Administered 2020-01-02 – 2020-01-14 (×36): 5000 [IU] via SUBCUTANEOUS
  Filled 2020-01-02 (×36): qty 1

## 2020-01-02 MED ORDER — DOCUSATE SODIUM 50 MG/5ML PO LIQD
100.0000 mg | Freq: Two times a day (BID) | ORAL | Status: DC
  Administered 2020-01-02: 100 mg
  Filled 2020-01-02: qty 10

## 2020-01-02 MED ORDER — SODIUM CHLORIDE 0.9 % IV SOLN: 1.0000 g | Freq: Two times a day (BID) | INTRAVENOUS | Status: DC

## 2020-01-02 MED ORDER — ORAL CARE MOUTH RINSE
15.0000 mL | OROMUCOSAL | Status: DC
  Administered 2020-01-02 – 2020-01-05 (×24): 15 mL via OROMUCOSAL

## 2020-01-02 MED ORDER — VITAL HIGH PROTEIN PO LIQD: 1000.0000 mL | ORAL | Status: DC

## 2020-01-02 MED ORDER — SODIUM CHLORIDE 0.9 % IV SOLN: 2.0000 g | Freq: Two times a day (BID) | INTRAVENOUS | Status: DC

## 2020-01-02 MED ORDER — LACTATED RINGERS IV BOLUS
500.0000 mL | Freq: Once | INTRAVENOUS | Status: AC
  Administered 2020-01-02: 500 mL via INTRAVENOUS

## 2020-01-02 MED ORDER — SODIUM CHLORIDE 0.9 % IV SOLN
2.0000 g | Freq: Two times a day (BID) | INTRAVENOUS | Status: DC
  Administered 2020-01-02 (×2): 2 g via INTRAVENOUS
  Filled 2020-01-02 (×4): qty 2

## 2020-01-02 MED ORDER — PRO-STAT SUGAR FREE PO LIQD
30.0000 mL | Freq: Two times a day (BID) | ORAL | Status: DC
  Administered 2020-01-02: 30 mL
  Filled 2020-01-02: qty 30

## 2020-01-02 MED ORDER — ASPIRIN 81 MG PO CHEW
81.0000 mg | CHEWABLE_TABLET | Freq: Every day | ORAL | Status: DC
  Administered 2020-01-02: 81 mg
  Filled 2020-01-02: qty 1

## 2020-01-02 MED ORDER — ADULT MULTIVITAMIN LIQUID CH: 15.0000 mL | Freq: Every day | ORAL | Status: DC

## 2020-01-02 MED ORDER — FAMOTIDINE 40 MG/5ML PO SUSR
20.0000 mg | Freq: Every day | ORAL | Status: DC
  Administered 2020-01-02: 20 mg
  Filled 2020-01-02 (×2): qty 2.5

## 2020-01-02 MED ORDER — ALBUMIN HUMAN 5 % IV SOLN
12.5000 g | Freq: Once | INTRAVENOUS | Status: AC
  Administered 2020-01-02: 12.5 g via INTRAVENOUS
  Filled 2020-01-02: qty 250

## 2020-01-02 MED ORDER — CHLORHEXIDINE GLUCONATE 0.12% ORAL RINSE (MEDLINE KIT)
15.0000 mL | Freq: Two times a day (BID) | OROMUCOSAL | Status: DC
  Administered 2020-01-02 – 2020-01-05 (×7): 15 mL via OROMUCOSAL

## 2020-01-02 MED ORDER — PREGABALIN 100 MG PO CAPS
100.0000 mg | ORAL_CAPSULE | Freq: Three times a day (TID) | ORAL | Status: DC
  Administered 2020-01-02 (×3): 100 mg
  Filled 2020-01-02 (×3): qty 1

## 2020-01-02 MED ORDER — PREDNISONE 5 MG PO TABS
5.0000 mg | ORAL_TABLET | Freq: Every day | ORAL | Status: DC
  Administered 2020-01-02 – 2020-01-03 (×2): 5 mg
  Filled 2020-01-02 (×2): qty 1

## 2020-01-02 MED ORDER — VITAL HIGH PROTEIN PO LIQD
1000.0000 mL | ORAL | Status: DC
  Administered 2020-01-02: 1000 mL

## 2020-01-02 NOTE — Progress Notes (Signed)
eLink Physician-Brief Progress Note Patient Name: Marco [name] DOB: 18-Apr-1956 MRN: [id number]   Date of Service  01/02/2020  HPI/Events of Note  Soft MAP < 65. Negative fluid balance. Just got US scrotum. HR ok. PVC's. Just started Kphos replacement.  eICU Interventions  - LR 500 ml bolus for now, call if not better. - follow [place] report.      Intervention Category Intermediate Interventions: Hypotension - evaluation and management  Elmer Sow 01/02/2020, 11:29 PM

## 2020-01-02 NOTE — Procedures (Signed)
Patient Name: Marco [name]  MRN: [id number]  Epilepsy Attending: Lora Havens  Referring Physician/Provider: Dr. Shelly Coss Date: 01/02/2020 Duration: 25.52 mins  Patient history: 64 year old male with altered mental status.  EEG evaluate for seizures.  Level of alertness: Comatose/sedated  AEDs during EEG study: Propofol  Technical aspects: This EEG study was done with scalp electrodes positioned according to the 10-20 International system of electrode placement. Electrical activity was acquired at a sampling rate of 500Hz  and reviewed with a high frequency filter of 70Hz  and a low frequency filter of 1Hz . EEG data were recorded continuously and digitally stored.   Description: EEG showed continuous generalized polymorphic 3 to 6 Hz theta-delta slowing.  EEG was reactive to tactile stimulation. Hyperventilation and photic stimulation were not performed.     ABNORMALITY -Continuous slow, generalized  IMPRESSION: This study is suggestive of severe diffuse encephalopathy, nonspecific etiology but could be related to sedation. No seizures or epileptiform discharges were seen throughout the recording.    Priyanka Barbra Sarks

## 2020-01-02 NOTE — TOC Progression Note (Signed)
Transition of Care [facility]) - Progression Note    Patient Details  Name: Marco [name] MRN: [id number] Date of Birth: [birth year]  Transition of [facility]) CM/SW Contact  Leeroy Cha, RN Phone Number: 01/02/2020, 8:05 AM  Clinical Narrative:    resp distress and failure pm of [id number] intubated and moved to icu, iv pepcid,merrem and vanco, has temp 104.6,iv sedation, rt renal mass abcess-culture-no growth. Bun 33 creat.-2.09, wbc-16.4 hgb 10.2   Expected Discharge Plan: [place] Barriers to Discharge: Continued Medical Work up  Expected Discharge Plan and Services Expected Discharge Plan: [place]   Discharge Planning Services: CM Consult   Living arrangements for the past 2 months: Single Family Home Expected Discharge Date: (unknown)               DME Arranged: [place] rolling DME Agency: [employer] Date DME Agency Contacted: 12/28/19 Time DME Agency [id number] Representative spoke with at DME Agency: [place] ([place]) Interventions    Readmission Risk Interventions Readmission Risk Prevention Plan 12/28/2019  Transportation Screening Complete  PCP or Specialist Appt within 3-5 Days Complete  HRI or [place] Complete  Social Work Consult for [place] Planning/Counseling [place] Not Applicable  Medication Review [employer]) Complete  Some recent data might be hidden

## 2020-01-02 NOTE — Progress Notes (Signed)
Bedside u/s of right pleural space performed.  Small amount of pleural fluid, but not enough to warrant thoracentesis at this time.  Chesley Mires, MD [place] Pager - [phone] 01/02/2020, 3:58 PM

## 2020-01-02 NOTE — Progress Notes (Signed)
Referring Physician(s): Keene Breath  Supervising Physician: Aletta Edouard  Patient Status:  [name]  Chief Complaint:  Fevers/sepsis/altered mental status  Subjective: Pt intubated, temp still elevated, current BP ok, cultures pend   Allergies: Nsaids  Medications: Prior to Admission medications   Medication Sig Start Date End Date Taking? Authorizing Provider  albuterol (PROVENTIL HFA;VENTOLIN HFA) 108 (90 Base) MCG/ACT inhaler Inhale 2 puffs into the lungs every 6 (six) hours as needed for wheezing or shortness of breath. 04/29/18  Yes [name], MD  allopurinol (ZYLOPRIM) 100 MG tablet Take 200 mg by mouth every evening.    Yes [provider]  atorvastatin (LIPITOR) 40 MG tablet Take 1 tablet (40 mg total) by mouth daily. 09/27/19  Yes Sueanne Margarita, MD  carvedilol (COREG) 12.5 MG tablet Take 1 tablet (12.5 mg total) by mouth 2 (two) times daily with a meal. 09/12/19  Yes Larey Dresser, MD  diclofenac Sodium (VOLTAREN) 1 % GEL Apply 4 g topically 4 (four) times daily. 10/23/19  Yes Darr, Marguerita Beards, [name]  DULoxetine (CYMBALTA) 20 MG capsule TAKE 1 CAPSULE BY MOUTH ONCE DAILY Patient taking differently: Take 20 mg by mouth daily.  12/21/19  Yes [name], MD  EQ ASPIRIN ADULT LOW DOSE 81 MG EC tablet TAKE 1 TABLET BY MOUTH ONCE DAILY. SWALLOW WHOLE Patient taking differently: Take 81 mg by mouth at bedtime.  08/01/19  Yes [name], MD  famotidine (PEPCID) 20 MG tablet TAKE 1 TABLET BY MOUTH TWO TIMES DAILY Patient taking differently: Take 20 mg by mouth 2 (two) times daily.  12/21/19  Yes [name], MD  fluticasone (FLONASE) 50 MCG/ACT nasal spray Place 2 sprays into both nostrils daily. 09/16/19  Yes [name], MD  furosemide (LASIX) 40 MG tablet Take 1 tablet (40 mg total) by mouth daily. Can take extra as needed 12/16/19 12/15/20 Yes Bensimhon, Shaune Pascal, MD  Insulin Lispro Prot & Lispro (HUMALOG MIX 75/25 KWIKPEN) (75-25) 100  UNIT/ML Kwikpen Inject 100 Units into the skin daily with breakfast. 12/20/19  Yes Renato Shin, MD  oxyCODONE (OXY IR/ROXICODONE) 5 MG immediate release tablet Take 5 mg by mouth every 6 (six) hours. 11/09/19  Yes [provider]  potassium chloride SA (KLOR-CON) 20 MEQ tablet Take 20 mEq by mouth daily. 12/05/19  Yes [provider]  predniSONE (DELTASONE) 5 MG tablet Take 5 mg by mouth daily. 11/23/19  Yes [provider]  pregabalin (LYRICA) 100 MG capsule Take 100 mg by mouth 3 (three) times daily.    Yes [provider]  sacubitril-valsartan (ENTRESTO) 49-51 MG Take 1 tablet by mouth 2 (two) times daily. Patient taking differently: Take 0.5 tablets by mouth 2 (two) times daily.  12/16/19  Yes Bensimhon, Shaune Pascal, MD  Secukinumab (COSENTYX) 150 MG/ML SOSY Inject 150 mg into the skin every 28 (twenty-eight) days.  06/02/16  Yes [provider]  Tafamidis (VYNDAMAX) 61 MG CAPS Take 61 mg by mouth daily. 09/08/19  Yes Bensimhon, Shaune Pascal, MD  zolpidem (AMBIEN CR) 6.25 MG CR tablet TAKE 1 TABLET BY MOUTH EVERY DAY AT BEDTIME AS NEEDED FOR SLEEP Patient taking differently: Take 6.25 mg by mouth at bedtime.  12/02/19  Yes [name], MD  Accu-Chek FastClix Lancets MISC Use one strip to monitor glucose levels BID; E11.42 12/06/18   Renato Shin, MD  glucose blood (ACCU-CHEK GUIDE) test strip Use one strip to monitor glucose levels BID; E11.42 12/06/18  Renato Shin, MD  Insulin Pen Needle (PEN NEEDLES) 31G X 6 MM MISC 1 each by Does not apply route daily. E11.9 10/19/19   Renato Shin, MD     Vital Signs: BP (!) 89/55   Pulse 99   Temp (!) 102.6 F (39.2 C) (Axillary)   Resp (!) 28   Ht 5\' 6"  (1.676 m)   Wt 238 lb 12.1 oz (108.3 kg)   SpO2 96%   BMI 38.54 kg/m   Physical Exam intubated, rt renal drain intact, insertion site ok, OP 50 cc bloody fluid, drain flushed with only minimal return  Imaging: DG Chest 1 View  Result Date:  12/29/2019 CLINICAL DATA:  Shortness of breath EXAM: CHEST  1 VIEW COMPARISON:  Dec 27, 2019 FINDINGS: There is mild atelectatic change in the right lower lung region. The lungs elsewhere are clear. There is cardiomegaly with pulmonary vascularity normal. No adenopathy. No bone lesions. IMPRESSION: Mild right lower lung region atelectasis. Lungs elsewhere clear. Stable cardiomegaly. Electronically Signed   By: Lowella Grip III M.D.   On: 12/29/2019 13:42   DG Abd 1 View  Result Date: 01/01/2020 CLINICAL DATA:  Orogastric tube placement. EXAM: ABDOMEN - 1 VIEW COMPARISON:  Dec 10, 2019 FINDINGS: Enteric catheter follows the expected location of the gastric body terminating in the area of the antrum. Nonobstructive bowel gas pattern. Pigtail catheter overlies the right upper quadrant of the abdomen. Left renal calculus is again noted. IMPRESSION: Enteric catheter follows the expected location of the gastric body terminating in the area of the antrum. Electronically Signed   By: Fidela Salisbury M.D.   On: 01/01/2020 19:05   MR BRAIN WO CONTRAST  Result Date: 12/30/2019 CLINICAL DATA:  Encephalopathy.  Sepsis. EXAM: MRI HEAD WITHOUT CONTRAST TECHNIQUE: Multiplanar, multiecho pulse sequences of the brain and surrounding structures were obtained without intravenous contrast. COMPARISON:  Head CT 12/27/2019 FINDINGS: Brain: Diffusion imaging does not show any acute or subacute infarction or other cause of restricted diffusion. No focal abnormality affects the brainstem or cerebellum. Cerebral hemispheres show minimal small vessel change of the white matter. No cortical or large vessel territory infarction. No mass lesion, hemorrhage, hydrocephalus or extra-axial collection. Vascular: Major vessels at the base of the brain show flow. Skull and upper cervical spine: Negative Sinuses/Orbits: Opacification of the left maxillary sinus, the sphenoid sinus and some of the left ethmoid sinuses. Internal material is  hypointense on T2 imaging. This could be chronic inspissated mucus or chronic fungal sinusitis. Other: None IMPRESSION: No significant intracranial finding. No acute infarction or other acute process. Sinus disease primarily affecting the left maxillary and sphenoid sinus. The sinuses could contain chronic inspissated mucus or could be affected by fungal sinusitis. Electronically Signed   By: Nelson Chimes M.D.   On: 12/30/2019 15:34   DG CHEST PORT 1 VIEW  Result Date: 01/02/2020 CLINICAL DATA:  Intubation EXAM: PORTABLE CHEST 1 VIEW COMPARISON:  Radiograph 01/01/2020, CT 05/17/2019 FINDINGS: Endotracheal tube in the mid trachea, 3.4 cm from the carina. Transesophageal tube tip is distal to the GE junction, terminating below the level of imaging. Telemetry leads overlie the chest. Gradient opacity and pleural thickening in the right lung base is suspicious for layering pleural effusion likely with adjacent atelectatic change though some underlying consolidation is not excluded. There is slight blunting of the left cardiophrenic sulcus as well which may reflect additional trace pleural fluid. Further atelectatic changes also present in the left lung base. Stable cardiomegaly with a calcified,  tortuous aorta. No acute osseous or soft tissue abnormality. IMPRESSION: 1. Gradient opacity and pleural thickening in the right lung base is suspicious for layering pleural effusion likely with adjacent atelectatic change though some underlying consolidation is not excluded. 2. Suspect trace left pleural fluid. 3. Stable cardiomegaly. 4.  Aortic Atherosclerosis (ICD10-I70.0). Electronically Signed   By: Lovena Le M.D.   On: 01/02/2020 05:46   DG Chest Port 1 View  Result Date: 01/01/2020 CLINICAL DATA:  Repositioning of ET tube. EXAM: PORTABLE CHEST 1 VIEW COMPARISON:  Jan 01, 2020 FINDINGS: The ETT terminates in the mid trachea in good position. The NG tube terminates below today's film. No pneumothorax. Mild  opacity remains in the right base. No other interval changes. IMPRESSION: 1. The ETT is in good position. The NG tube terminates below today's film. 2. Linear opacity in the right base is likely either fluid in a fissure or atelectasis. Mild haziness over the right base is favored represent atelectasis rather than infiltrate given the waxing and waning appearance over the last couple of chest x-rays. Recommend attention on follow-up. Electronically Signed   By: Dorise Bullion III M.D   On: 01/01/2020 20:23   Portable Chest x-ray  Result Date: 01/01/2020 CLINICAL DATA:  ETT placement.  OG tube placement. EXAM: PORTABLE CHEST 1 VIEW COMPARISON:  Jan 01, 2020 FINDINGS: The ETT terminates 1 cm above the carina. Recommend withdrawing 2 cm. No pneumothorax. The OG tube terminates below today's film. Mild opacity in the right base favored represent atelectasis. No other interval changes. IMPRESSION: 1. The ETT terminates 1 cm above the carina. Recommend withdrawing 2 cm. 2. The OG tube terminates below today's film. 3. Mild atelectasis in the right base. These results will be called to the ordering clinician or representative by the Radiologist Assistant, and communication documented in the PACS or [employer]. Electronically Signed   By: Dorise Bullion III M.D   On: 01/01/2020 19:05   DG CHEST PORT 1 VIEW  Result Date: 01/01/2020 CLINICAL DATA:  Hypoxemia. EXAM: PORTABLE CHEST 1 VIEW COMPARISON:  Jan 01, 2020 FINDINGS: Stable cardiomegaly. The hila and mediastinum are normal. No pneumothorax. Mild opacity in the right base is new in the interval. No other acute abnormalities. IMPRESSION: Mild opacity in the right base may represent atelectasis or developing infiltrate. Recommend short-term follow-up imaging to ensure resolution. No other abnormalities. Electronically Signed   By: Dorise Bullion III M.D   On: 01/01/2020 18:25   DG CHEST PORT 1 VIEW  Result Date: 01/01/2020 CLINICAL DATA:  Acute  respiratory failure. EXAM: PORTABLE CHEST 1 VIEW COMPARISON:  Dec 31, 2019 FINDINGS: Mild, stable diffusely increased lung markings are seen. Very mild right infrahilar atelectasis is noted. There is no evidence of a pleural effusion or pneumothorax. The cardiac silhouette is moderately enlarged and unchanged in size. The visualized skeletal structures are unremarkable. IMPRESSION: 1. Very mild right infrahilar atelectasis. 2. Stable cardiomegaly. 3. Stable diffusely increased lung markings which may represent mild pulmonary edema. Electronically Signed   By: Virgina Norfolk M.D.   On: 01/01/2020 15:02   DG CHEST PORT 1 VIEW  Result Date: 12/31/2019 CLINICAL DATA:  64 year old male with a history of shortness of breath EXAM: PORTABLE CHEST 1 VIEW COMPARISON:  12/29/2019 FINDINGS: Cardiomediastinal silhouette unchanged with cardiomegaly. Interlobular septal thickening. Mild patchy airspace opacity in the lower lungs slightly increased from the comparison. No pneumothorax or pleural effusion. IMPRESSION: Evidence of early pulmonary edema and basilar atelectasis. Electronically Signed  By: Corrie Mckusick D.O.   On: 12/31/2019 09:43   EEG adult  Result Date: 01/02/2020 Lora Havens, MD     01/02/2020  9:22 AM Patient Name: Marco [name] MRN: [id number] Epilepsy Attending: Lora Havens Referring Physician/Provider: Dr. Shelly Coss Date: 01/02/2020 Duration: 25.52 mins Patient history: 64 year old male with altered mental status.  EEG evaluate for seizures. Level of alertness: Comatose/sedated AEDs during EEG study: Propofol Technical aspects: This EEG study was done with scalp electrodes positioned according to the 10-20 International system of electrode placement. Electrical activity was acquired at a sampling rate of 500Hz  and reviewed with a high frequency filter of 70Hz  and a low frequency filter of 1Hz . EEG data were recorded continuously and digitally stored. Description: EEG showed continuous  generalized polymorphic 3 to 6 Hz theta-delta slowing.  EEG was reactive to tactile stimulation. Hyperventilation and photic stimulation were not performed.   ABNORMALITY -Continuous slow, generalized IMPRESSION: This study is suggestive of severe diffuse encephalopathy, nonspecific etiology but could be related to sedation. No seizures or epileptiform discharges were seen throughout the recording. Lora Havens   ECHOCARDIOGRAM COMPLETE  Result Date: 12/31/2019    ECHOCARDIOGRAM REPORT   Patient Name:   SRIJAN BRUNGARDT Date of Exam: 12/31/2019 Medical Rec #:  [id number]     Height:       66.0 in Accession #:    [id number]    Weight:       238.8 lb Date of Birth:  15-Mar-1956     BSA:          2.156 m Patient Age:    64 years      BP:           180/78 mmHg Patient Gender: M             HR:           95 bpm. Exam Location:  Inpatient Procedure: 2D Echo Indications:    fever 780.6  History:        Patient has prior history of Echocardiogram examinations. CHF,                 CAD, Arrythmias:Atrial Fibrillation; Risk Factors:Diabetes,                 Hypertension and Former Smoker.  Sonographer:    Jannett Celestine RDCS (AE) Referring Phys: [id number]  Sonographer Comments: Image acquisition challenging due to respiratory motion. restricted mobility. patient on BiPap machine during exam IMPRESSIONS  1. Mild hypokinesis of the distal septum and apex. Left ventricular ejection fraction, by estimation, is 50 to 55%. The left ventricle has low normal function. The left ventricle demonstrates regional wall motion abnormalities (see scoring diagram/findings for description). There is mild concentric left ventricular hypertrophy. Left ventricular diastolic parameters are consistent with Grade I diastolic dysfunction (impaired relaxation).  2. Right ventricular systolic function is normal. The right ventricular size is normal.  3. Right atrial size was mildly dilated.  4. The mitral valve is normal in structure. No  evidence of mitral valve regurgitation. No evidence of mitral stenosis.  5. The aortic valve is tricuspid. Aortic valve regurgitation is not visualized. No aortic stenosis is present.  6. The inferior vena cava is normal in size with greater than 50% respiratory variability, suggesting right atrial pressure of 3 mmHg. FINDINGS  Left Ventricle: Mild hypokinesis of the distal septum and apex. Left ventricular ejection fraction, by estimation, is 50 to 55%. The left ventricle has low  normal function. The left ventricle demonstrates regional wall motion abnormalities. The left ventricular internal cavity size was normal in size. There is mild concentric left ventricular hypertrophy. Left ventricular diastolic parameters are consistent with Grade I diastolic dysfunction (impaired relaxation). Indeterminate filling pressures. Right Ventricle: The right ventricular size is normal. No increase in right ventricular wall thickness. Right ventricular systolic function is normal. Left Atrium: Left atrial size was normal in size. Right Atrium: Right atrial size was mildly dilated. Pericardium: There is no evidence of pericardial effusion. Mitral Valve: The mitral valve is normal in structure. Normal mobility of the mitral valve leaflets. No evidence of mitral valve regurgitation. No evidence of mitral valve stenosis. Tricuspid Valve: The tricuspid valve is normal in structure. Tricuspid valve regurgitation is trivial. No evidence of tricuspid stenosis. Aortic Valve: The aortic valve is tricuspid. Aortic valve regurgitation is not visualized. No aortic stenosis is present. Pulmonic Valve: The pulmonic valve was normal in structure. Pulmonic valve regurgitation is not visualized. No evidence of pulmonic stenosis. Aorta: The aortic root is normal in size and structure. Venous: The inferior vena cava is normal in size with greater than 50% respiratory variability, suggesting right atrial pressure of 3 mmHg. IAS/Shunts: No atrial  level shunt detected by color flow Doppler.  LEFT VENTRICLE PLAX 2D LVIDd:         5.30 cm  Diastology LVIDs:         3.70 cm  LV e' lateral:   6.20 cm/s LV PW:         1.20 cm  LV E/e' lateral: 12.3 LV IVS:        1.20 cm  LV e' medial:    5.77 cm/s LVOT diam:     2.20 cm  LV E/e' medial:  13.2 LV SV:         64 LV SV Index:   30 LVOT Area:     3.80 cm  RIGHT VENTRICLE RV S prime:     15.30 cm/s TAPSE (M-mode): 2.0 cm LEFT ATRIUM             Index       RIGHT ATRIUM           Index LA diam:        3.20 cm 1.48 cm/m  RA Area:     23.10 cm LA Vol (A2C):   49.6 ml 23.00 ml/m RA Volume:   70.00 ml  32.47 ml/m LA Vol (A4C):   45.3 ml 21.01 ml/m LA Biplane Vol: 51.4 ml 23.84 ml/m  AORTIC VALVE LVOT Vmax:   112.00 cm/s LVOT Vmean:  83.800 cm/s LVOT VTI:    0.168 m  AORTA Ao Root diam: 3.20 cm MITRAL VALVE               TRICUSPID VALVE MV Area (PHT): 3.77 cm    TR Peak grad:   28.3 mmHg MV Decel Time: 201 msec    TR Vmax:        266.00 cm/s MV E velocity: 76.30 cm/s MV A velocity: 87.80 cm/s  SHUNTS MV E/A ratio:  0.87        Systemic VTI:  0.17 m                            Systemic Diam: 2.20 cm Skeet Latch MD Electronically signed by Skeet Latch MD Signature Date/Time: 12/31/2019/12:49:48 PM    Final    CT IMAGE GUIDED DRAINAGE  BY PERCUTANEOUS CATHETER  Result Date: 01/01/2020 INDICATION: 64 year old with inflammation around a right renal cyst. Concern for an infected right renal cyst. Request for percutaneous drainage. EXAM: CT-GUIDED DRAINAGE OF RIGHT RENAL CYST MEDICATIONS: Moderate sedation ANESTHESIA/SEDATION: 1.0 mg IV Versed 25 mcg IV Fentanyl Moderate Sedation Time:  88 minutes The patient was continuously monitored during the procedure by the interventional radiology nurse under my direct supervision. COMPLICATIONS: None immediate. TECHNIQUE: Informed consent was obtained from the patient's family after a thorough discussion of the procedural risks, benefits and alternatives. All questions  were addressed. Maximal Sterile Barrier Technique was utilized including caps, mask, sterile gowns, sterile gloves, sterile drape, hand hygiene and skin antiseptic. A timeout was performed prior to the initiation of the procedure. PROCEDURE: Patient was placed prone on the CT scanner. Images through the abdomen were obtained. The hyperdense cyst along the posterior aspect of the right kidney was targeted. The right flank was prepped with chlorhexidine and sterile field was created. Skin and soft tissues were anesthetized with 1% lidocaine. Small incision was made. Using CT guidance, an 18 gauge trocar needle was initially directed into the fluid around the cyst. Thick bloody fluid was aspirated. Needle was eventually directed into the hyperdense cyst and additional dark bloody fluid was aspirated. Superstiff Amplatz wire was advanced into the collection and tract was dilated to accommodate a 10 [place] multipurpose drain. However, the follow-up CT images demonstrated that do drain did not stay within the cystic collection but rather went cephalad to the kidney. Pulled the drain back but the side holes were outside of the abdomen. As a result, this drain was completely removed. Therefore, a 18 gauge trocar needle was directed back into the renal cyst using a combination of ultrasound and CT guidance. Wire position was confirmed within the cyst and the tract was dilated to accommodate a 10 French Bettey Mare catheter. This catheter was felt to be within this cyst and additional bloody fluid was removed. Approximately 30 mL of bloody fluid was removed throughout the procedure. Catheter was attached to a suction bulb and sutured to the skin. Dressing was placed. FINDINGS: Again noted is a hyperdense exophytic structure suggestive for hemorrhagic cyst in the posterior right kidney upper pole. Inflammatory changes and fluid around the cyst are similar to the recent CT from 12/28/2019. It was difficult to place a drain  within this cyst due to the small size and configuration. Eventually, 10 French Bettey Mare catheter was placed within the cyst and a total of 30 mL of bloody fluid was removed from the cyst and surrounding tissues. IMPRESSION: CT-guided placement of a drainage catheter within the hemorrhagic right renal cyst. Fluid was sent for culture. Electronically Signed   By: Markus Daft M.D.   On: 01/01/2020 16:39    Labs:  CBC: Recent Labs    12/31/19 1203 01/01/20 0750 01/01/20 1845 01/02/20 0521  WBC 15.0* 14.0* 12.2* 16.4*  HGB 8.8* 9.3* 10.5* 10.2*  HCT 28.3* 30.1* 33.0* 32.4*  PLT 284 316 356 378    COAGS: Recent Labs    12/08/19 2014 12/30/19 1106  INR 1.3* 1.2  APTT 33  --     BMP: Recent Labs    12/31/19 1203 01/01/20 0750 01/01/20 1845 01/02/20 0521  NA 143 142 145 148*  K 4.0 4.2 4.4 3.6  CL 109 108 107 108  CO2 25 22 25 28   GLUCOSE 120* 117* 147* 135*  BUN 22 25* 29* 33*  CALCIUM 7.7* 8.0* 8.2* 8.1*  CREATININE 1.73* 1.63* 1.68* 2.09*  GFRNONAA 41* 44* 42* 32*  GFRAA 47* 51* 49* 38*    LIVER FUNCTION TESTS: Recent Labs    12/15/19 1112 12/31/19 1203 01/01/20 0750 01/02/20 0521  BILITOT 1.0 1.0 1.4* 1.2  AST 27 16 17  37  ALT 47* 17 17 30   ALKPHOS 65 50 49 58  PROT 7.6 6.7 6.8 7.3  ALBUMIN 2.7* 2.2* 2.2* 2.6*    Assessment and Plan: Pt with hx sepsis of uncertain origin,AMS, currently intubated; also with hemorrhagic ? infected rt renal cyst, s/p drainage 5/23; temp 102.7, creat 2.09, WBC 16.4(12.2), hgb stable; blood/drain fluid cx pend; on vanc; hydrate ; await final cx/sens; if OP remains low from renal drain or WBC cont to climb would repeat CT in next 48-72 hrs   Electronically Signed: D. Rowe Robert, [name] 01/02/2020, 10:15 AM   I spent a total of 15 minutes at the the patient's bedside AND on the patient's hospital floor or unit, greater than 50% of which was counseling/coordinating care for right renal drain    Patient ID: Marco [name], male   DOB: 01/21/1956, 64 y.o.   MRN: [id number]

## 2020-01-02 NOTE — Progress Notes (Addendum)
Urology Progress Note   Subjective: Persistent fevers, as high as 104.  Intermittent tachycardia.  Decreased blood pressures but not requiring vasopressors. Fortunately, still with good urine output 2.2 L in the past 24 hours Persistent leukocytosis Worsening creatinine 2.0 from 1.6. Is given worsening symptoms, interventional radiology aspirated and placed a drain into the right renal cyst with return of bloody fluid, this was apparently a fairly small pocket.  Gram stain is initially without organisms although his been on antibiosis therapy. Audible wheezing impending respiratory failure and there was transferred to ICU and intubated yesterday.  On examination this morning, he is responsive to pain, particularly with testicular examination.  Objective: Vital signs in last 24 hours: Temp:  [97.6 F (36.4 C)-104.6 F (40.3 C)] 102.6 F (39.2 C) (05/24 0800) Pulse Rate:  [57-103] 99 (05/24 0900) Resp:  [18-70] 28 (05/24 0900) BP: (89-194)/(53-96) 89/55 (05/24 0600) SpO2:  [76 %-100 %] 96 % (05/24 0809) FiO2 (%):  [40 %-100 %] 40 % (05/24 0809)  Intake/Output from previous day: 05/23 0701 - 05/24 0700 In: 947.6 [I.V.:417.4; IV Piggyback:525.2] Out: 2300 [Urine:2250; Drains:50] Intake/Output this shift: No intake/output data recorded.  Physical Exam:  General: Intubated, sedated Abdomen: Soft, obese, nontender. Right IR JP drain with bloody output. GU: Condom catheter in place draining clear yellow urine. Bilateral descended testicles with moderate tenderness bilaterally; no swelling, no erythema, no fluctuance   Lab Results: Recent Labs    01/01/20 0750 01/01/20 1845 01/02/20 0521  HGB 9.3* 10.5* 10.2*  HCT 30.1* 33.0* 32.4*   Recent Labs    01/01/20 1845 01/02/20 0521  NA 145 148*  K 4.4 3.6  CL 107 108  CO2 25 28  GLUCOSE 147* 135*  BUN 29* 33*  CREATININE 1.68* 2.09*  CALCIUM 8.2* 8.1*    Studies/Results: DG Abd 1 View  Result Date: 01/01/2020 CLINICAL  DATA:  Orogastric tube placement. EXAM: ABDOMEN - 1 VIEW COMPARISON:  Dec 10, 2019 FINDINGS: Enteric catheter follows the expected location of the gastric body terminating in the area of the antrum. Nonobstructive bowel gas pattern. Pigtail catheter overlies the right upper quadrant of the abdomen. Left renal calculus is again noted. IMPRESSION: Enteric catheter follows the expected location of the gastric body terminating in the area of the antrum. Electronically Signed   By: Fidela Salisbury M.D.   On: 01/01/2020 19:05   DG CHEST PORT 1 VIEW  Result Date: 01/02/2020 CLINICAL DATA:  Intubation EXAM: PORTABLE CHEST 1 VIEW COMPARISON:  Radiograph 01/01/2020, CT 05/17/2019 FINDINGS: Endotracheal tube in the mid trachea, 3.4 cm from the carina. Transesophageal tube tip is distal to the GE junction, terminating below the level of imaging. Telemetry leads overlie the chest. Gradient opacity and pleural thickening in the right lung base is suspicious for layering pleural effusion likely with adjacent atelectatic change though some underlying consolidation is not excluded. There is slight blunting of the left cardiophrenic sulcus as well which may reflect additional trace pleural fluid. Further atelectatic changes also present in the left lung base. Stable cardiomegaly with a calcified, tortuous aorta. No acute osseous or soft tissue abnormality. IMPRESSION: 1. Gradient opacity and pleural thickening in the right lung base is suspicious for layering pleural effusion likely with adjacent atelectatic change though some underlying consolidation is not excluded. 2. Suspect trace left pleural fluid. 3. Stable cardiomegaly. 4.  Aortic Atherosclerosis (ICD10-I70.0). Electronically Signed   By: Lovena Le M.D.   On: 01/02/2020 05:46   DG Chest [name] 1 View  Result  Date: 01/01/2020 CLINICAL DATA:  Repositioning of ET tube. EXAM: PORTABLE CHEST 1 VIEW COMPARISON:  Jan 01, 2020 FINDINGS: The ETT terminates in the mid  trachea in good position. The NG tube terminates below today's film. No pneumothorax. Mild opacity remains in the right base. No other interval changes. IMPRESSION: 1. The ETT is in good position. The NG tube terminates below today's film. 2. Linear opacity in the right base is likely either fluid in a fissure or atelectasis. Mild haziness over the right base is favored represent atelectasis rather than infiltrate given the waxing and waning appearance over the last couple of chest x-rays. Recommend attention on follow-up. Electronically Signed   By: Dorise Bullion III M.D   On: 01/01/2020 20:23   Portable Chest x-ray  Result Date: 01/01/2020 CLINICAL DATA:  ETT placement.  OG tube placement. EXAM: PORTABLE CHEST 1 VIEW COMPARISON:  Jan 01, 2020 FINDINGS: The ETT terminates 1 cm above the carina. Recommend withdrawing 2 cm. No pneumothorax. The OG tube terminates below today's film. Mild opacity in the right base favored represent atelectasis. No other interval changes. IMPRESSION: 1. The ETT terminates 1 cm above the carina. Recommend withdrawing 2 cm. 2. The OG tube terminates below today's film. 3. Mild atelectasis in the right base. These results will be called to the ordering clinician or representative by the Radiologist Assistant, and communication documented in the PACS or [employer]. Electronically Signed   By: Dorise Bullion III M.D   On: 01/01/2020 19:05   DG CHEST PORT 1 VIEW  Result Date: 01/01/2020 CLINICAL DATA:  Hypoxemia. EXAM: PORTABLE CHEST 1 VIEW COMPARISON:  Jan 01, 2020 FINDINGS: Stable cardiomegaly. The hila and mediastinum are normal. No pneumothorax. Mild opacity in the right base is new in the interval. No other acute abnormalities. IMPRESSION: Mild opacity in the right base may represent atelectasis or developing infiltrate. Recommend short-term follow-up imaging to ensure resolution. No other abnormalities. Electronically Signed   By: Dorise Bullion III M.D   On:  01/01/2020 18:25   DG CHEST PORT 1 VIEW  Result Date: 01/01/2020 CLINICAL DATA:  Acute respiratory failure. EXAM: PORTABLE CHEST 1 VIEW COMPARISON:  Dec 31, 2019 FINDINGS: Mild, stable diffusely increased lung markings are seen. Very mild right infrahilar atelectasis is noted. There is no evidence of a pleural effusion or pneumothorax. The cardiac silhouette is moderately enlarged and unchanged in size. The visualized skeletal structures are unremarkable. IMPRESSION: 1. Very mild right infrahilar atelectasis. 2. Stable cardiomegaly. 3. Stable diffusely increased lung markings which may represent mild pulmonary edema. Electronically Signed   By: Virgina Norfolk M.D.   On: 01/01/2020 15:02   EEG adult  Result Date: 01/02/2020 Lora Havens, MD     01/02/2020  9:22 AM Patient Name: Marco [name] MRN: [id number] Epilepsy Attending: Lora Havens Referring Physician/Provider: Dr. Shelly Coss Date: 01/02/2020 Duration: 25.52 mins Patient history: 64 year old male with altered mental status.  EEG evaluate for seizures. Level of alertness: Comatose/sedated AEDs during EEG study: Propofol Technical aspects: This EEG study was done with scalp electrodes positioned according to the 10-20 International system of electrode placement. Electrical activity was acquired at a sampling rate of 500Hz  and reviewed with a high frequency filter of 70Hz  and a low frequency filter of 1Hz . EEG data were recorded continuously and digitally stored. Description: EEG showed continuous generalized polymorphic 3 to 6 Hz theta-delta slowing.  EEG was reactive to tactile stimulation. Hyperventilation and photic stimulation were not performed.   ABNORMALITY -Continuous  slow, generalized IMPRESSION: This study is suggestive of severe diffuse encephalopathy, nonspecific etiology but could be related to sedation. No seizures or epileptiform discharges were seen throughout the recording. Lora Havens   ECHOCARDIOGRAM  COMPLETE  Result Date: 12/31/2019    ECHOCARDIOGRAM REPORT   Patient Name:   FRANCISZEK ARRAS Date of Exam: 12/31/2019 Medical Rec #:  [id number]     Height:       66.0 in Accession #:    [id number]    Weight:       238.8 lb Date of Birth:  [birth year]     BSA:          2.156 m Patient Age:    60 years      BP:           180/78 mmHg Patient Gender: M             HR:           95 bpm. Exam Location:  Inpatient Procedure: 2D Echo Indications:    fever 780.6  History:        Patient has prior history of Echocardiogram examinations. CHF,                 CAD, Arrythmias:Atrial Fibrillation; Risk Factors:Diabetes,                 Hypertension and Former Smoker.  Sonographer:    Jannett Celestine RDCS (AE) Referring Phys: [id number] [facility]  Sonographer Comments: Image acquisition challenging due to respiratory motion. restricted mobility. patient on BiPap machine during exam IMPRESSIONS  1. Mild hypokinesis of the distal septum and apex. Left ventricular ejection fraction, by estimation, is 50 to 55%. The left ventricle has low normal function. The left ventricle demonstrates regional wall motion abnormalities (see scoring diagram/findings for description). There is mild concentric left ventricular hypertrophy. Left ventricular diastolic parameters are consistent with Grade I diastolic dysfunction (impaired relaxation).  2. Right ventricular systolic function is normal. The right ventricular size is normal.  3. Right atrial size was mildly dilated.  4. The mitral valve is normal in structure. No evidence of mitral valve regurgitation. No evidence of mitral stenosis.  5. The aortic valve is tricuspid. Aortic valve regurgitation is not visualized. No aortic stenosis is present.  6. The inferior vena cava is normal in size with greater than 50% respiratory variability, suggesting right atrial pressure of 3 mmHg. FINDINGS  Left Ventricle: Mild hypokinesis of the distal septum and apex. Left ventricular ejection fraction, by  estimation, is 50 to 55%. The left ventricle has low normal function. The left ventricle demonstrates regional wall motion abnormalities. The left ventricular internal cavity size was normal in size. There is mild concentric left ventricular hypertrophy. Left ventricular diastolic parameters are consistent with Grade I diastolic dysfunction (impaired relaxation). Indeterminate filling pressures. Right Ventricle: The right ventricular size is normal. No increase in right ventricular wall thickness. Right ventricular systolic function is normal. Left Atrium: Left atrial size was normal in size. Right Atrium: Right atrial size was mildly dilated. Pericardium: There is no evidence of pericardial effusion. Mitral Valve: The mitral valve is normal in structure. Normal mobility of the mitral valve leaflets. No evidence of mitral valve regurgitation. No evidence of mitral valve stenosis. Tricuspid Valve: The tricuspid valve is normal in structure. Tricuspid valve regurgitation is trivial. No evidence of tricuspid stenosis. Aortic Valve: The aortic valve is tricuspid. Aortic valve regurgitation is not visualized. No aortic stenosis is present. Pulmonic Valve: The  pulmonic valve was normal in structure. Pulmonic valve regurgitation is not visualized. No evidence of pulmonic stenosis. Aorta: The aortic root is normal in size and structure. Venous: The inferior vena cava is normal in size with greater than 50% respiratory variability, suggesting right atrial pressure of 3 mmHg. IAS/Shunts: No atrial level shunt detected by color flow Doppler.  LEFT VENTRICLE PLAX 2D LVIDd:         5.30 cm  Diastology LVIDs:         3.70 cm  LV e' lateral:   6.20 cm/s LV PW:         1.20 cm  LV E/e' lateral: 12.3 LV IVS:        1.20 cm  LV e' medial:    5.77 cm/s LVOT diam:     2.20 cm  LV E/e' medial:  13.2 LV SV:         64 LV SV Index:   30 LVOT Area:     3.80 cm  RIGHT VENTRICLE RV S prime:     15.30 cm/s TAPSE (M-mode): 2.0 cm LEFT ATRIUM              Index       RIGHT ATRIUM           Index LA diam:        3.20 cm 1.48 cm/m  RA Area:     23.10 cm LA Vol (A2C):   49.6 ml 23.00 ml/m RA Volume:   70.00 ml  32.47 ml/m LA Vol (A4C):   45.3 ml 21.01 ml/m LA Biplane Vol: 51.4 ml 23.84 ml/m  AORTIC VALVE LVOT Vmax:   112.00 cm/s LVOT Vmean:  83.800 cm/s LVOT VTI:    0.168 m  AORTA Ao Root diam: 3.20 cm MITRAL VALVE               TRICUSPID VALVE MV Area (PHT): 3.77 cm    TR Peak grad:   28.3 mmHg MV Decel Time: 201 msec    TR Vmax:        266.00 cm/s MV E velocity: 76.30 cm/s MV A velocity: 87.80 cm/s  SHUNTS MV E/A ratio:  0.87        Systemic VTI:  0.17 m                            Systemic Diam: 2.20 cm Skeet Latch MD Electronically signed by Skeet Latch MD Signature Date/Time: 12/31/2019/12:49:48 PM    Final    CT IMAGE GUIDED DRAINAGE BY PERCUTANEOUS CATHETER  Result Date: 01/01/2020 INDICATION: 64 year old with inflammation around a right renal cyst. Concern for an infected right renal cyst. Request for percutaneous drainage. EXAM: CT-GUIDED DRAINAGE OF RIGHT RENAL CYST MEDICATIONS: Moderate sedation ANESTHESIA/SEDATION: 1.0 mg IV Versed 25 mcg IV Fentanyl Moderate Sedation Time:  88 minutes The patient was continuously monitored during the procedure by the interventional radiology nurse under my direct supervision. COMPLICATIONS: None immediate. TECHNIQUE: Informed consent was obtained from the patient's family after a thorough discussion of the procedural risks, benefits and alternatives. All questions were addressed. Maximal Sterile Barrier Technique was utilized including caps, mask, sterile gowns, sterile gloves, sterile drape, hand hygiene and skin antiseptic. A timeout was performed prior to the initiation of the procedure. PROCEDURE: Patient was placed prone on the CT scanner. Images through the abdomen were obtained. The hyperdense cyst along the posterior aspect of the right kidney was targeted. The right  flank was prepped  with chlorhexidine and sterile field was created. Skin and soft tissues were anesthetized with 1% lidocaine. Small incision was made. Using CT guidance, an 18 gauge trocar needle was initially directed into the fluid around the cyst. Thick bloody fluid was aspirated. Needle was eventually directed into the hyperdense cyst and additional dark bloody fluid was aspirated. Superstiff Amplatz wire was advanced into the collection and tract was dilated to accommodate a 10 [place] multipurpose drain. However, the follow-up CT images demonstrated that do drain did not stay within the cystic collection but rather went cephalad to the kidney. Pulled the drain back but the side holes were outside of the abdomen. As a result, this drain was completely removed. Therefore, a 18 gauge trocar needle was directed back into the renal cyst using a combination of ultrasound and CT guidance. Wire position was confirmed within the cyst and the tract was dilated to accommodate a 10 French Bettey Mare catheter. This catheter was felt to be within this cyst and additional bloody fluid was removed. Approximately 30 mL of bloody fluid was removed throughout the procedure. Catheter was attached to a suction bulb and sutured to the skin. Dressing was placed. FINDINGS: Again noted is a hyperdense exophytic structure suggestive for hemorrhagic cyst in the posterior right kidney upper pole. Inflammatory changes and fluid around the cyst are similar to the recent CT from 12/28/2019. It was difficult to place a drain within this cyst due to the small size and configuration. Eventually, 10 French Bettey Mare catheter was placed within the cyst and a total of 30 mL of bloody fluid was removed from the cyst and surrounding tissues. IMPRESSION: CT-guided placement of a drainage catheter within the hemorrhagic right renal cyst. Fluid was sent for culture. Electronically Signed   By: Markus Daft M.D.   On: 01/01/2020 16:39    Assessment/Plan:  64  y.o. male with history of A. fib, diabetes on insulin, chronic kidney disease, congestive heart failure, coronary artery disease, hypertension, arthritis on chronic prednisone, obesity, urethral stricture, nephrolithiasis, recently treated for right pyelonephritis who returns with ongoing weakness.  Overall, incredibly complicated patient with recent complicated infection. Would not exclude other etiologies for his presenting symptoms given his complex past medical history.  Right renal cyst with stranding on imaging / prior right pyelonephritis: Presented approximately 3 weeks ago with altered mental status, hypotension and diagnosed with right pyelonephritis treated with ceftriaxone and discharged on cefdinir after urine culture returned with E. coli.  Returned approximately 1 week later with fevers and weakness but also hypoglycemia that improved his symptoms after treatment.  Now admitted on May 18 with ongoing weakness.  Has had persistent low-grade fevers and mild leukocytosis since being started on antibiotics.  Blood cultures have not been previously positive.  While initial urine culture on 12/08/2019 did result with MDR E. coli, repeat urine cultures on 12/15/2019 and 12/27/2019 were without growth.  Noted to have right upper pole cyst since RUS in 2016, 2.5cm at that time; 3.8cm on 08/2017 RUS; MRI 08/2019 favor Bosniak 1/2 cysts with a <1cm right upper pole subcapsular lesions unable to be characterized.  Right perinephric stranding from initial episode (CT 12/09/19), progressive on 5/6 imaging, now 12/28/19 CT a/p with ~4cm expophytic right upper pole cysto with more stranding.  12/27/19 Pelvic Ultrasound with Prostate volume of 15cc. PSAd (using 2020 PSA) is 0.08 ng/ml/cc.   Overall low suspicion for renal cell carcinoma.  May have developed pyelonephritis for unclear reasons with possible  translocation of bacteria to cyst which is now infected causing persistent pyelonephritis which appears to  be refractory to current antibiosis regimen. Given worsening fevers with persistent leukocytosis, underwent IR aspriation and drain placement of right renal cyst on 5/23; gram stain without organisms (had been on antibiotic therapy).   - Agree with broad spectrum antibiotics - Appreciate ID assistance - Continue to monitor Right renal cyst IR drain output - Follow up right renal cyst aspirate cultures  Dysuria in the setting of prior urethral stricture History of vague dysuria.  This has been documented over the past 3 years.  S/p fossa navicularus dilation in Aug 2019 as well as noted with soft wide bore bulbar stricture (Dr. Karsten Ro).  Prior clinic PVRs were normal.  Pelvic ultrasound is reassuring for no prostatic abscess.  Urine culture 12/27/2019 with no growth. Bilateral testicular pain on physical exam 5/24 (while intubated).    - Obtain MRI Pelvis/Prostate with contrast to evaluate for occult prostate abscess  Bilateral Nephrolithiasis Reported has required prior interventions. None with [facility]. No recent stone passage. No hydronephrosis. No intervention required. If develops new flank pain with signs of infection, please inform urology.   Left renal cyst Known since imagine in 12/2014: 2.2cm at that time.  2.2cm, exophytic seen on CT. No intervention or follow up.      LOS: 6 days

## 2020-01-02 NOTE — Progress Notes (Signed)
Pt transported to MRI while on ventilator.  Pt remained stable throughout trip and MRI test.  Pt then transported back to room 1226.  Pt remained stable on ventilator throughout trip.

## 2020-01-02 NOTE — Progress Notes (Signed)
Pharmacy Antibiotic Note  Marco [name] is a 64 y.o. male admitted on 12/27/2019 with sepsis.  Pharmacy has been consulted for vancomycin & meropenem dosing. 01/02/2020  D#7 total abx, D#3 meropenem, D#5 vanc WBC up to 16.4, T max 104.6 rectal, PCT up to 5.11.  Intubated 5/23 5/23 IR placed drain into R renal cyst/abscess  Plan: Change meropenem 1 gm IV q8h to 2 gm q12 Continue vancomycin 1 gm IV q24 F/u ID recs F/u renal fxn, WBC, temp, culture data Vancomycin trough as needed  Height: 5\' 6"  (167.6 cm) Weight: 108.3 kg (238 lb 12.1 oz) IBW/kg (Calculated) : 63.8  Temp (24hrs), Avg:102.2 F (39 C), Min:97.6 F (36.4 C), Max:104.6 F (40.3 C)  Recent Labs  Lab 12/27/19 0730 12/27/19 1008 12/28/19 0440 12/31/19 0230 12/31/19 1203 01/01/20 0750 01/01/20 1845 01/01/20 1846 01/01/20 2045 01/02/20 0521  WBC  --   --    < > 15.8* 15.0* 14.0* 12.2*  --   --  16.4*  CREATININE  --   --    < > 1.65* 1.73* 1.63* 1.68*  --   --  2.09*  LATICACIDVEN 2.5* 1.1  --   --  1.0  --   --  2.3* 2.7*  --    < > = values in this interval not displayed.    Estimated Creatinine Clearance: 41.2 mL/min (A) (by C-G formula based on SCr of 2.09 mg/dL (H)).    Allergies  Allergen Reactions  . Nsaids Other (See Comments)    Stomach pains. Ulcers - stated by patient    Antimicrobials this admission:  5/18 ceftriaxone >> 5/20 5/20 cefepime >> 5/22 5/20 vancomycin >>  5/22 merrem >> Dose adjustments this admission:  5/22 Vanc 2 gm x 1 then 750 q12> decr 1 gm q24 5/24 mero 1 gm q8> 2 gm q12 Microbiology results:  5/18 BCx: NGF 5/18 UCx: NGF  5/20 BCx: ngtd 5/21 cryptococcal antigen:neg 5/23 R renal abscess: ngtd 5/21 MRSA neg  Thank you for allowing pharmacy to be a part of this patient's care.  Eudelia Bunch, Pharm.D 01/02/2020 8:48 AM

## 2020-01-02 NOTE — Progress Notes (Signed)
Nutrition Follow-up  DOCUMENTATION CODES:   Obesity unspecified  INTERVENTION:  - will order TF: Vital High Protein @ 55 ml/hr with 30 ml prostat once/day via OGT.  - this regimen + kcal from current propofol rate will provide 1489 kcal, 130 grams protein, and 1103 ml free water.  - will order juven BID once sepsis physiology is resolving.  * weigh patient today.   NUTRITION DIAGNOSIS:   Inadequate oral intake related to inability to eat as evidenced by NPO status. -revised  GOAL:   Provide needs based on ASPEN/SCCM guidelines -to be met with TF regimen  MONITOR:   Vent status, TF tolerance, Labs, Weight trends  REASON FOR ASSESSMENT:   Ventilator, Consult Enteral/tube feeding initiation and management  ASSESSMENT:   64 y.o. male with medical history of CHF, non-ischemic cardiomyopathy, stage 3 CKD, type 2 DM, CAD, HTN, obesity, OSA, A. fib s/p ablation in 2017, and psoriatic arthritis on daily prednisone. He presented to the ED with complaints of weakness, lethargy, and multiple falls at home. He was admitted earlier in the month for pyelonephritis. Since discharge he has been experiencing urinary frequency, dysuria, and incontinence. He is being admitted for UTI/prostatitis.  Significant Events: 5/17- admission 5/20- initial RD assessment 5/23- intubation and OGT placement at 1850   Patient was previously on Carb Modified diet with last recorded intakes being 0% of lunch and 50% of dinner on 5/20. Diet then downgraded to CLD on 5/21 at 1030.  Patient is currently intubated with OGT in place (gastric). No family/visitors present. Able to talk with RN at bedside and patient also discussed in rounds this AM. Patient has not been weighed since 5/18. Mild edema noted to BLE.   Per notes: - s/p EEG this AM with Neuro note stating finding of severe diffuse encephalopathy which may be related to sedation - sepsis - possible pulmonary edema - acute agitated delirium/metabolic  encephalopathy     Patient is currently intubated on ventilator support MV: 12.3 L/min Temp (24hrs), Avg:102.3 F (39.1 C), Min:97.6 F (36.4 C), Max:104.6 F (40.3 C) Propofol: 2.6 ml/hr (69 kcal) BP: 114/59 and MAP: 77  Labs reviewed; CBGs: 103, 112, and 118 mg/dl, Na: 148 mmol/l, BUN: 33 mg/dl, creatinine: 2.09 mg/dl, Ca: 8.1 mg/dl, GFR: 38 ml/min. Medications reviewed; 12.5 g x1 dose 5/24, 100 mg colace per OGT/day, 20 mg pepcid per OGT/day, 40 mg IV lasix x1 dose 5/22 and x2 doses 5/23, sliding scale novolog, 15 ml multivitamin/day, 17 g miralax per OGT/day, 20 mEq Klor-Con/day.  Drip; propofol @ 4 mcg/kg/min.   Diet Order:   Diet Order            Diet NPO time specified  Diet effective now              EDUCATION NEEDS:   No education needs have been identified at this time  Skin:  Skin Assessment: Skin Integrity Issues: Skin Integrity Issues:: Unstageable Unstageable: full thickness to bilateral buttocks  Last BM:  5/24  Height:   Ht Readings from Last 1 Encounters:  01/01/20 _0  (1.676 m)    Weight:   Wt Readings from Last 1 Encounters:  12/27/19 108.3 kg    Estimated Nutritional Needs:  Kcal:  2947-6546 kcal Protein:  >/= 129 grams Fluid:  >/= 2 L/day     Jarome Matin, MS, RD, LDN, CNSC Inpatient Clinical Dietitian RD pager # available in [place]  After hours/weekend pager # available in [facility]

## 2020-01-02 NOTE — Progress Notes (Addendum)
NAME:  Marco [name], MRN:  [id number], DOB:  08-17-1955, LOS: 6 ADMISSION DATE:  12/27/2019, CONSULTATION DATE:  5/23 REFERRING MD:  Dr. Elbert Ewings, CHIEF COMPLAINT:  Weakness, lethargy   Brief History   64 y/o M who presented to [facility] on 5/18 with reports of weakness, lethargy and falls. Recent admit from 4/30-5/4 with severe sepsis due to MDR E-Coli right pyelonephritis.  Initially admitted with concern for sepsis with fever, leukocytosis and elevated lactic acid. Work up concerning for possible infected right renal cyst with associated right renal pyelonephritis.  He had progressive agitated delirium, fevers despite antibiotics.  IR drained renal cyst 5/23 with bloody fluid returned.  Ultimately, required intubation on 5/23 for agitated delirium / impending respiratory failure.    Past Medical History  RA  Morbid Obesity  HTN Chronic combined systolic & diastolic CHF Non-obstructive CAD  Persistent AF CKD III  DM  GERD Esophageal stricture Esophageal hernia  Chronic Pain   Significant Hospital Events   5/18 Admit  5/20 Urology consulted for right renal cyst, concern for source of sepsis  5/21 ID consulted. LP recommended but could not be done.  5/22 CCM consulted  5/23 IR consult, R renal cyst drained with 30 ml of bloody fluid removed.  Intubated for agitated delirium.   Consults:  Urology  ID  CCM  IR   Procedures:  ETT 5/23 >>   Significant Diagnostic Tests:   CT Head 5/18 >> no acute intracranial findings or mass lesions, stable severe chronic left maxillary and left half sphenoid sinus disease   US Pelvis 5/18 >> normal exam   CT ABD/Pelvis w/o 5/19 >> small bilateral pleural effusions with mild bibasilar atelectasis, stable 3.9 x 2.9 cm exophytic isodense area along the posterior aspect of the upper pole of the right kidney with moderate to marked amount of surrounding inflammatory fat stranding.  This likely represents a hemorrhagic cyst with associated right sided  pyelonephritis.  Stable left renal cyst.  Bilateral non-obstructing renal stones. Colonic diverticulosis. Bilateral fat containing inguinal hernias.   MRI Brain w/o 5/21 >> no significant intracranial finding, no acute infarction or other acute process. Sinus disease primarily affecting the left maxillary and sphenoid sinus.  The sinuses could contain chronic inspissated mucus or could be fungal sinusitis   ECHO 5/22 >> mild hypokinesis of the distal septum & apex. LVEF 50-55%, LV has low normal function.  LV demonstrates regional wall motion abnormalities.  Mild concentric left ventricular hypertrophy.  Grade I diastolic dysfunction. RV systolic function normal.  RA mildly dilated.  EEG 5/24 >>   Micro Data:  COVID 5/18 >> negative  UA 5/18 >> trace leukocytes, 30 protein, rare bacteria, 0-5 WBC UC 5/18 >> negative  MRSA PCR 5/21 >> negative  BCx2 5/18 >> negative  R Renal Cyst Abscess 5/23 >>  BCx2 5/20 >>  Crypto Antigen 5/21 >> negative   Antimicrobials:  Ceftriaxone 5/18 >> 5/20  Vanco 5/20 >>  Meropenem 5/24 >>   Interim history/subjective:  Tmax 104.6 / WBC 16.4 On vent - 40% FiO2, 5 PEEP  Glucose range 112-135 I/O - 2.2L UOP, -1.3L in 24h RN reports cooling blanket in place without rectal monitor.  Fever 103 pre-blanket but post recorded at 104.6, may not have been accurate  Objective   Blood pressure (!) 89/55, pulse 96, temperature (!) 102.6 F (39.2 C), temperature source Axillary, resp. rate (!) 28, height 5\' 6"  (1.676 m), weight 108.3 kg, SpO2 96 %.    Vent Mode:  PRVC FiO2 (%):  [40 %-100 %] 40 % Set Rate:  [28 bmp] 28 bmp Vt Set:  [450 mL-510 mL] 450 mL PEEP:  [5 cmH20] 5 cmH20 Plateau Pressure:  [15 [id number] cmH20] 15 cmH20   Intake/Output Summary (Last 24 hours) at 01/02/2020 0848 Last data filed at 01/02/2020 0700 Gross per 24 hour  Intake 947.6 ml  Output 2300 ml  Net -1352.4 ml   Filed Weights   12/26/19 2321 12/27/19 1254  Weight: 110.2 kg 108.3 kg      Examination: General: critically ill appearing adult male lying in bed in NAD on vent HEENT: MM pink/moist, ETT, pupils =/reactive Neuro: sedate on propofol, awakens to voice, nods / follows commands  CV: s1s2 RRR, no m/r/g PULM: non-labored on vent, diminshed bases bilaterally R>L GI: soft, bsx4 active.  R renal cyst drain with minimal bloody fluid in JP drain Extremities: warm/dry, trace peripheral edema  Skin: no rashes or lesions  PCXR 5/24 >> images personally reviewed, ETT in good position. R side layering effusion with associated atelectasis, small left effusion  Resolved Hospital Problem list      Assessment & Plan:   Sepsis in setting of suspected Right Pyelonephritis with Infected Cyst  Hx of E-Coli UTI. Concern for infected right renal cyst.  -appreciate ID input -continue abx as above  -follow cultures  -appreciate Urology, IR input  -consider other sources given fluid / cultures negative thus far and on baseline prednisone / immune suppression  Acute Respiratory Failure with Hypoxemia in setting of Sepsis  Right Pleural Effusion  OSA  Concern for component pulmonary edema with elevated BNP, effusions.  -PRVC 8cc/kg  -wean PEEP / fiO2 for sats >90% -adjust rate to 20  -follow up CXR in am  -follow right effusion, may need right sided chest evaluation with [place] -daily SBT / WUA   Acute Agitated Delirium / Metabolic Encephalopathy  -continue PAD protocol  -RASS Goal 0 to -1 -minimize sedating medications as able  -delirium prevention measures -await EEG, doubt seizure -documented as unable to get LP > order in place for IR guided from 5/21  Left Maxillary Sinus Disease Chronic disease with mucoid impaction vs fungal infection on imaging.  -defer fungal coverage to ID  -follow cultures   CKD III  -renal dose medications  -hold further lasix 5/24 -Trend BMP / urinary output -Replace electrolytes as indicated -Avoid nephrotoxic agents, ensure adequate  renal perfusion  Chronic Combined Systolic / Diastolic CHF  Troponin Leak Persistent AF -on coreg, no anticoagulation prior to admit  Cardiac cat in 2019 with non-ischemic systolic CHF, LVEF [id number]. 10/2019 with normal systolic function. Suspect troponin leak demand ischemia.  -follow troponin, EKG -avoid QTc prolonging medications  -continue ASA, lipitor, coreg  -repeat troponin 5/24 to ensure clearance  -hold home entresto   Psoriatic Arthritis  RA  On prednisone, cosentyx, oxycodone -hold prednisone for now, consider restarting  -if hypotensive, consider stress dose steroids  -resume lyrica  Gout  -hold allopurinol with AKI   At Risk Malnutrition  -begin TF   Best practice:  Diet: TF Pain/Anxiety/Delirium protocol (if indicated): Propofol  VAP protocol (if indicated): In place  DVT prophylaxis: resume DVT dosing heparin GI prophylaxis: pepcid  Glucose control: SSI  Mobility: BR  Code Status: Full Code  Family Communication: Wife (Amethyst) updated via phone 5/24 Disposition: ICU   Labs   CBC: Recent Labs  Lab 12/29/19 0526 12/29/19 0526 12/30/19 [id number] 12/30/19 0512 12/31/19 0230 12/31/19 1203 01/01/20 0750 01/01/20  1845 01/02/20 0521  WBC 12.8*   < > 16.8*   < > 15.8* 15.0* 14.0* 12.2* 16.4*  NEUTROABS 8.4*  --  12.7*  --  11.7*  --  10.0*  --  11.0*  HGB 10.0*   < > 9.7*   < > 9.4* 8.8* 9.3* 10.5* 10.2*  HCT 31.1*   < > 30.1*   < > 30.3* 28.3* 30.1* 33.0* 32.4*  MCV 90.7   < > 90.4   < > 92.9 94.0 93.2 92.4 92.8  PLT 256   < > 267   < > 278 284 316 356 378   < > = values in this interval not displayed.    Basic Metabolic Panel: Recent Labs  Lab 12/31/19 0230 12/31/19 1203 01/01/20 0750 01/01/20 1845 01/01/20 2045 01/02/20 0521  NA 142 143 142 145  --  148*  K 3.9 4.0 4.2 4.4  --  3.6  CL 110 109 108 107  --  108  CO2 23 25 22 25   --  28  GLUCOSE 100* 120* 117* 147*  --  135*  BUN 18 22 25* 29*  --  33*  CREATININE 1.65* 1.73* 1.63* 1.68*  --   2.09*  CALCIUM 8.1* 7.7* 8.0* 8.2*  --  8.1*  MG  --  2.1  --   --  2.0  --   PHOS  --  2.8  --   --   --   --    GFR: Estimated Creatinine Clearance: 41.2 mL/min (A) (by C-G formula based on SCr of 2.09 mg/dL (H)). Recent Labs  Lab 12/27/19 1008 12/28/19 0440 12/31/19 1203 01/01/20 0750 01/01/20 1845 01/01/20 1846 01/01/20 2045 01/02/20 0521  PROCALCITON  --   --  0.30 0.35  --   --   --  5.11  WBC  --    < > 15.0* 14.0* 12.2*  --   --  16.4*  LATICACIDVEN 1.1  --  1.0  --   --  2.3* 2.7*  --    < > = values in this interval not displayed.    Liver Function Tests: Recent Labs  Lab 12/31/19 1203 01/01/20 0750 01/02/20 0521  AST 16 17 37  ALT 17 17 30   ALKPHOS 50 49 58  BILITOT 1.0 1.4* 1.2  PROT 6.7 6.8 7.3  ALBUMIN 2.2* 2.2* 2.6*   No results for input(s): LIPASE, AMYLASE in the last 168 hours. No results for input(s): AMMONIA in the last 168 hours.  ABG    Component Value Date/Time   PHART 7.453 (H) 01/02/2020 0355   PCO2ART 36.9 01/02/2020 0355   PO2ART 110 (H) 01/02/2020 0355   HCO3 24.6 01/02/2020 0355   TCO2 25 05/19/2018 1430   ACIDBASEDEF 3.3 (H) 12/31/2019 0908   O2SAT 97.1 01/02/2020 0355     Coagulation Profile: Recent Labs  Lab 12/30/19 1106  INR 1.2    Cardiac Enzymes: No results for input(s): CKTOTAL, CKMB, CKMBINDEX, TROPONINI in the last 168 hours.  HbA1C: HbA1c, POC (controlled diabetic range)  Date/Time Value Ref Range Status  05/02/2019 01:55 PM 7.2 (A) 0.0 - 7.0 % Final  01/29/2018 03:38 PM 10.2 (A) 0.0 - 7.0 % Final   Hgb A1c MFr Bld  Date/Time Value Ref Range Status  12/08/2019 08:00 PM 8.4 (H) 4.8 - 5.6 % Final    Comment:    (NOTE) Pre diabetes:          5.7%-6.4% Diabetes:              >  6.4% Glycemic control for   <7.0% adults with diabetes   09/26/2019 11:16 AM 9.2 (H) 4.8 - 5.6 % Final    Comment:    (NOTE) Pre diabetes:          5.7%-6.4% Diabetes:              >6.4% Glycemic control for   <7.0% adults with  diabetes     CBG: Recent Labs  Lab 12/29/19 1201 01/01/20 1924 01/02/20 0036 01/02/20 0417 01/02/20 0807  GLUCAP 109* 131* 103* 112* 118*     Critical care time: 15 minutes     Noe Gens, MSN, NP-C [facility] 01/02/2020, 8:49 AM   Please see [email or web address] for pager details.

## 2020-01-02 NOTE — Progress Notes (Signed)
EEG complete - results pending 

## 2020-01-02 NOTE — Progress Notes (Signed)
PT Cancellation Note  Patient Details Name: Marco [name] MRN: [id number] DOB: 1956-01-05   Cancelled Treatment:    Reason Eval/Treat Not Completed: Medical issues which prohibited therapy , intubated 5/23. Will check back another time.  [name] 01/02/2020, 8:03 AM [place] Pager [phone] Office [phone]

## 2020-01-02 NOTE — Progress Notes (Addendum)
eLink Physician-Brief Progress Note Patient Name: Kyzer Krotz DOB: 1956/07/11 MRN: [id number]   Date of Service  01/02/2020  HPI/Events of Note  Sepsis/on Vent. ID, Neuro on case. Encephalopathy. S/p IR renal procedure-UTI. [facility] do not have staff to go for MRI tonight. Po4 low 1.9, K 3.6. gets daily Kcl 20 . Cr elevated, stable.   eICU Interventions  Ok to do MRI in AM. Kphos 10 mmols IV ordered. please notify.      Intervention Category Intermediate Interventions: Electrolyte abnormality - evaluation and management;Diagnostic test evaluation  Radiologist called and suggested to get US scrotum if needed to see any abscess/collection and MRI abd/pelvis ordered, no scrotum/penis section and suggested to do in AM. Agreed and ordered US scroutm stat.  Elmer Sow 01/02/2020, 8:32 PM

## 2020-01-02 NOTE — Progress Notes (Signed)
eLink Physician-Brief Progress Note Patient Name: Marco [name] DOB: Aug 05, 1956 MRN: [id number]   Date of Service  01/02/2020  HPI/Events of Note  Notified of borderline Bps after lasix was given MAP is 61 Also asked to review X ray for NG tube   eICU Interventions  1. Albumin IV x 1 2. Ok to use NG tube      Intervention Category Major Interventions: Shock - evaluation and management Minor Interventions: Clinical assessment - ordering diagnostic tests  Margaretmary Lombard 01/02/2020, 12:51 AM

## 2020-01-02 NOTE — Progress Notes (Signed)
[facility] for Infectious Disease   Reason for visit: Follow up on fever  Interval History: intubated in the ICU; Tmax 104.  MRI with sinus disease. Day 3 meropenem Day 4 vancomycin Day 7 total antibiotics   Physical Exam: Constitutional:  Vitals:   01/02/20 1200 01/02/20 1301  BP: (!) 123/57 (!) 128/57  Pulse: 94 90  Resp: (!) 29 (!) 22  Temp: (!) 101.8 F (38.8 C)   SpO2: 93% 92%   patient is sedated HENT: + ET Respiratory: respiratory effort on vent Cardiovascular: RRR GI: soft, nt, nd  Review of Systems: Unable to be assessed due to patient factors  Lab Results  Component Value Date   WBC 16.4 (H) 01/02/2020   HGB 10.2 (L) 01/02/2020   HCT 32.4 (L) 01/02/2020   MCV 92.8 01/02/2020   PLT 378 01/02/2020    Lab Results  Component Value Date   CREATININE 2.09 (H) 01/02/2020   BUN 33 (H) 01/02/2020   NA 148 (H) 01/02/2020   K 3.6 01/02/2020   CL 108 01/02/2020   CO2 28 01/02/2020    Lab Results  Component Value Date   ALT 30 01/02/2020   AST 37 01/02/2020   ALKPHOS 58 01/02/2020     Microbiology: Recent Results (from the past 240 hour(s))  SARS Coronavirus 2 by RT PCR (hospital order, performed in [facility] hospital lab) Nasopharyngeal Nasopharyngeal Swab     Status: None   Collection Time: 12/27/19  4:44 AM   Specimen: Nasopharyngeal Swab  Result Value Ref Range Status   SARS Coronavirus 2 NEGATIVE NEGATIVE Final    Comment: (NOTE) SARS-CoV-2 target nucleic acids are NOT DETECTED. The SARS-CoV-2 RNA is generally detectable in upper and lower respiratory specimens during the acute phase of infection. The lowest concentration of SARS-CoV-2 viral copies this assay can detect is 250 copies / mL. A negative result does not preclude SARS-CoV-2 infection and should not be used as the sole basis for treatment or other patient management decisions.  A negative result may occur with improper specimen collection / handling, submission of specimen  other than nasopharyngeal swab, presence of viral mutation(s) within the areas targeted by this assay, and inadequate number of viral copies (<250 copies / mL). A negative result must be combined with clinical observations, patient history, and epidemiological information. Fact Sheet for Patients:   StrictlyIdeas.no Fact Sheet for Healthcare Providers: BankingDealers.co.za This test is not yet approved or cleared  by the [place] FDA and has been authorized for detection and/or diagnosis of SARS-CoV-2 by FDA under an Emergency Use Authorization (EUA).  This EUA will remain in effect (meaning this test can be used) for the duration of the COVID-19 declaration under Section 564(b)(1) of the Act, 21 U.S.C. section 360bbb-3(b)(1), unless the authorization is terminated or revoked sooner. Performed at [facility], [place] [address], [place], [place] [address]   Urine culture     Status: None   Collection Time: 12/27/19  4:44 AM   Specimen: Urine, Clean Catch  Result Value Ref Range Status   Specimen Description   Final    URINE, CLEAN CATCH Performed at [facility], [place] [address], [place], [place] [address]    Special Requests   Final    NONE Performed at [facility], [place] [address], [place], [place] [address]    Culture   Final    NO GROWTH Performed at [facility], [place] [address],  [place], [place] [address]    Report Status 12/28/2019 FINAL  Final  Blood culture (routine x 2)     Status: None   Collection Time: 12/27/19  7:01 AM   Specimen: BLOOD  Result Value Ref Range Status   Specimen Description   Final    BLOOD RIGHT ANTECUBITAL Performed at [place] [address] [place], [place] [address]    Special Requests   Final    BOTTLES DRAWN AEROBIC AND ANAEROBIC Blood Culture adequate volume Performed at [place] [address] [place], [place] [address]    Culture   Final    NO GROWTH 5 DAYS Performed at [facility], [place] [address] [place], [place] [address]    Report Status 01/01/2020 FINAL  Final  Blood culture (routine x 2)     Status: None   Collection Time: 12/27/19  7:27 AM   Specimen: BLOOD  Result Value Ref Range Status   Specimen Description   Final    BLOOD LEFT ANTECUBITAL Performed at [place] [address] [place], [place] [address]    Special Requests   Final    BOTTLES DRAWN AEROBIC AND ANAEROBIC Blood Culture adequate volume Performed at [place] [address] [place], [place] [address]    Culture   Final    NO GROWTH 5 DAYS Performed at [facility], [place] [address], [place], [place] [address]    Report Status 01/01/2020 FINAL  Final  Culture, blood (routine x 2)     Status: None (Preliminary result)   Collection Time: 12/29/19 10:06 AM   Specimen: BLOOD  Result Value Ref Range Status   Specimen Description   Final    BLOOD RIGHT ARM Performed at [place] [address] [place], [place] [address]    Special Requests   Final    BOTTLES DRAWN AEROBIC AND ANAEROBIC Blood Culture adequate volume Performed at [place] [address], [place], [place] [address]    Culture   Final    NO GROWTH 4 DAYS Performed at [facility], [place] [address] [place], [place] [address]    Report Status PENDING  Incomplete  Culture, blood (routine x 2)     Status: None (Preliminary result)   Collection Time: 12/29/19 10:07 AM   Specimen: BLOOD  Result Value Ref Range Status   Specimen Description   Final    BLOOD RIGHT ARM Performed at [place] [address], [place], [place] [address]    Special Requests   Final    BOTTLES DRAWN AEROBIC AND ANAEROBIC Blood Culture adequate volume Performed at [place]  [address] [place], [place] [address]    Culture   Final    NO GROWTH 4 DAYS Performed at [facility], [place] [address] [place],  [address]    Report Status PENDING  Incomplete  MRSA PCR Screening     Status: None   Collection Time: 12/30/19  1:14 PM   Specimen: Nasal Mucosa; Nasopharyngeal  Result Value Ref Range Status   MRSA by PCR NEGATIVE NEGATIVE Final    Comment:        The GeneXpert MRSA Assay (FDA approved for NASAL specimens only), is one component of a comprehensive MRSA colonization surveillance program. It is not intended to diagnose MRSA infection nor to guide or monitor treatment for MRSA infections.  Performed at [facility], [place] [address], [place], [place] [address]   Aerobic/Anaerobic Culture (surgical/deep wound)     Status: None (Preliminary result)   Collection Time: 01/01/20  3:14 PM   Specimen: Abscess  Result Value Ref Range Status   Specimen Description   Final    ABSCESS Performed at [facility] [address], [place], [place] [address]    Special Requests   Final    RIGHT RENAL CYST Performed at [place] [address], [place], [place] [address]    Gram Stain   Final    FEW WBC PRESENT, PREDOMINANTLY PMN NO ORGANISMS SEEN Performed at [facility], [place] [address] [place], [place] [address]    Culture FEW GRAM NEGATIVE RODS  Final   Report Status PENDING  Incomplete    Impression/Plan:  1. Fever - up to 104.  ? If due to holding steroids.  I favor restarting the steroids to see if the fever then improves.    2.  Fever and leukocytosis - no obvious cause but broad differential.  MRI findings noted and non-specific for fungal sinusitis and without extension to his brain it should not cause significant encephalopathy.   Will continue with empiric coverage with vancomycin and meropenem  3.  Infected renal cyst - growth with GNR.  Will continue to monitor and  continue with meropenem.  Urology to get an MRI to further evaluate for infection.

## 2020-01-02 NOTE — Progress Notes (Signed)
Abg obtained on the following vent settings: PRVC mode, VT=450, RR28, 50%, +5PEEP.  Results for AVRON, MAYO (MRN [id number]) as of 01/02/2020 04:38  Ref. Range 01/02/2020 03:55  Delivery systems Unknown VENTILATOR  FIO2 Unknown 50.00  Mode Unknown PRESSURE REGULATED VOLUME CONTROL  VT Latest Units: mL 450  Peep/cpap Latest Units: cm H20 5.0  pH, Arterial Latest Ref Range: 7.350 - 7.450  7.453 (H)  pCO2 arterial Latest Ref Range: 32.0 - 48.0 mmHg 36.9  pO2, Arterial Latest Ref Range: 83.0 - 108.0 mmHg 110 (H)  Acid-Base Excess Latest Ref Range: 0.0 - 2.0 mmol/L 2.0  Bicarbonate Latest Ref Range: 20.0 - 28.0 mmol/L 24.6  O2 Saturation Latest Units: % 97.1  Patient temperature Unknown 103.5  Collection site Unknown RIGHT RADIAL  Allens test (pass/fail) Latest Ref Range: PASS  PASS

## 2020-01-03 ENCOUNTER — Inpatient Hospital Stay (HOSPITAL_COMMUNITY): Payer: Medicaid Other

## 2020-01-03 LAB — COMPREHENSIVE METABOLIC PANEL
ALT: 67 U/L — ABNORMAL HIGH (ref 0–44)
AST: 67 U/L — ABNORMAL HIGH (ref 15–41)
Albumin: 2.2 g/dL — ABNORMAL LOW (ref 3.5–5.0)
Alkaline Phosphatase: 56 U/L (ref 38–126)
Anion gap: 8 (ref 5–15)
BUN: 44 mg/dL — ABNORMAL HIGH (ref 8–23)
CO2: 27 mmol/L (ref 22–32)
Calcium: 7.8 mg/dL — ABNORMAL LOW (ref 8.9–10.3)
Chloride: 111 mmol/L (ref 98–111)
Creatinine, Ser: 2.13 mg/dL — ABNORMAL HIGH (ref 0.61–1.24)
GFR calc Af Amer: 37 mL/min — ABNORMAL LOW (ref >60)
GFR calc non Af Amer: 32 mL/min — ABNORMAL LOW (ref >60)
Glucose, Bld: 186 mg/dL — ABNORMAL HIGH (ref 70–99)
Potassium: 3.2 mmol/L — ABNORMAL LOW (ref 3.5–5.1)
Sodium: 146 mmol/L — ABNORMAL HIGH (ref 135–145)
Total Bilirubin: 1 mg/dL (ref 0.3–1.2)
Total Protein: 6.6 g/dL (ref 6.5–8.1)

## 2020-01-03 LAB — GLUCOSE, CAPILLARY
Glucose-Capillary: 106 mg/dL — ABNORMAL HIGH (ref 70–99)
Glucose-Capillary: 138 mg/dL — ABNORMAL HIGH (ref 70–99)
Glucose-Capillary: 140 mg/dL — ABNORMAL HIGH (ref 70–99)
Glucose-Capillary: 144 mg/dL — ABNORMAL HIGH (ref 70–99)
Glucose-Capillary: 163 mg/dL — ABNORMAL HIGH (ref 70–99)
Glucose-Capillary: 209 mg/dL — ABNORMAL HIGH (ref 70–99)
Glucose-Capillary: 98 mg/dL (ref 70–99)

## 2020-01-03 LAB — CBC WITH DIFFERENTIAL/PLATELET
Abs Immature Granulocytes: 0.26 10*3/uL — ABNORMAL HIGH (ref 0.00–0.07)
Basophils Absolute: 0.1 10*3/uL (ref 0.0–0.1)
Basophils Relative: 0 %
Eosinophils Absolute: 0 10*3/uL (ref 0.0–0.5)
Eosinophils Relative: 0 %
HCT: 27.7 % — ABNORMAL LOW (ref 39.0–52.0)
Hemoglobin: 8.5 g/dL — ABNORMAL LOW (ref 13.0–17.0)
Immature Granulocytes: 1 %
Lymphocytes Relative: 9 %
Lymphs Abs: 1.7 10*3/uL (ref 0.7–4.0)
MCH: 28.2 pg (ref 26.0–34.0)
MCHC: 30.7 g/dL (ref 30.0–36.0)
MCV: 92 fL (ref 80.0–100.0)
Monocytes Absolute: 2.5 10*3/uL — ABNORMAL HIGH (ref 0.1–1.0)
Monocytes Relative: 13 %
Neutro Abs: 14.5 10*3/uL — ABNORMAL HIGH (ref 1.7–7.7)
Neutrophils Relative %: 77 %
Platelets: 355 10*3/uL (ref 150–400)
RBC: 3.01 MIL/uL — ABNORMAL LOW (ref 4.22–5.81)
RDW: 16.8 % — ABNORMAL HIGH (ref 11.5–15.5)
WBC: 19 10*3/uL — ABNORMAL HIGH (ref 4.0–10.5)
nRBC: 0 % (ref 0.0–0.2)

## 2020-01-03 LAB — CULTURE, BLOOD (ROUTINE X 2)
Culture: NO GROWTH
Culture: NO GROWTH
Special Requests: ADEQUATE
Special Requests: ADEQUATE

## 2020-01-03 LAB — LACTIC ACID, PLASMA: Lactic Acid, Venous: 2.4 mmol/L (ref 0.5–1.9)

## 2020-01-03 LAB — TRIGLYCERIDES: Triglycerides: 148 mg/dL (ref <150)

## 2020-01-03 LAB — MAGNESIUM: Magnesium: 2.2 mg/dL (ref 1.7–2.4)

## 2020-01-03 LAB — PHOSPHORUS: Phosphorus: 2.4 mg/dL — ABNORMAL LOW (ref 2.5–4.6)

## 2020-01-03 LAB — POTASSIUM: Potassium: 3.6 mmol/L (ref 3.5–5.1)

## 2020-01-03 MED ORDER — POLYETHYLENE GLYCOL 3350 17 G PO PACK
17.0000 g | PACK | Freq: Every day | ORAL | Status: DC
  Administered 2020-01-09 – 2020-01-11 (×2): 17 g via ORAL
  Filled 2020-01-03 (×5): qty 1

## 2020-01-03 MED ORDER — ADULT MULTIVITAMIN W/MINERALS CH
1.0000 | ORAL_TABLET | Freq: Every day | ORAL | Status: DC
  Administered 2020-01-03 – 2020-01-14 (×10): 1 via ORAL
  Filled 2020-01-03 (×11): qty 1

## 2020-01-03 MED ORDER — SODIUM CHLORIDE 0.9 % IV SOLN
250.0000 mL | INTRAVENOUS | Status: DC
  Administered 2020-01-03: 250 mL via INTRAVENOUS

## 2020-01-03 MED ORDER — ASPIRIN EC 81 MG PO TBEC
81.0000 mg | DELAYED_RELEASE_TABLET | Freq: Every day | ORAL | Status: DC
  Administered 2020-01-03 – 2020-01-13 (×11): 81 mg via ORAL
  Filled 2020-01-03 (×11): qty 1

## 2020-01-03 MED ORDER — ATORVASTATIN CALCIUM 40 MG PO TABS
40.0000 mg | ORAL_TABLET | Freq: Every day | ORAL | Status: DC
  Administered 2020-01-03 – 2020-01-04 (×2): 40 mg via ORAL
  Filled 2020-01-03 (×2): qty 1

## 2020-01-03 MED ORDER — POTASSIUM CHLORIDE 20 MEQ/15ML (10%) PO SOLN
20.0000 meq | Freq: Once | ORAL | Status: AC
  Administered 2020-01-03: 20 meq
  Filled 2020-01-03: qty 15

## 2020-01-03 MED ORDER — PREDNISONE 5 MG PO TABS
5.0000 mg | ORAL_TABLET | Freq: Every day | ORAL | Status: DC
  Administered 2020-01-04 – 2020-01-14 (×10): 5 mg via ORAL
  Filled 2020-01-03 (×10): qty 1

## 2020-01-03 MED ORDER — PREGABALIN 100 MG PO CAPS
100.0000 mg | ORAL_CAPSULE | Freq: Three times a day (TID) | ORAL | Status: DC
  Administered 2020-01-03 – 2020-01-04 (×6): 100 mg via ORAL
  Filled 2020-01-03 (×7): qty 1

## 2020-01-03 MED ORDER — SODIUM CHLORIDE 0.9% FLUSH
5.0000 mL | Freq: Three times a day (TID) | INTRAVENOUS | Status: DC
  Administered 2020-01-04 – 2020-01-13 (×21): 5 mL via INTRAVENOUS

## 2020-01-03 MED ORDER — SODIUM CHLORIDE 0.9 % IV SOLN
2.0000 g | INTRAVENOUS | Status: DC
  Administered 2020-01-03 – 2020-01-13 (×11): 2 g via INTRAVENOUS
  Filled 2020-01-03: qty 20
  Filled 2020-01-03 (×2): qty 2
  Filled 2020-01-03: qty 20
  Filled 2020-01-03 (×3): qty 2
  Filled 2020-01-03: qty 20
  Filled 2020-01-03 (×2): qty 2
  Filled 2020-01-03: qty 20
  Filled 2020-01-03: qty 2

## 2020-01-03 MED ORDER — DOCUSATE SODIUM 50 MG/5ML PO LIQD: 100.0000 mg | Freq: Two times a day (BID) | ORAL | Status: DC

## 2020-01-03 MED ORDER — OXYCODONE-ACETAMINOPHEN 5-325 MG PO TABS
1.0000 | ORAL_TABLET | ORAL | Status: DC | PRN
  Administered 2020-01-03 – 2020-01-14 (×30): 1 via ORAL
  Filled 2020-01-03 (×30): qty 1

## 2020-01-03 MED ORDER — LACTATED RINGERS IV BOLUS
500.0000 mL | Freq: Once | INTRAVENOUS | Status: AC
  Administered 2020-01-03: 500 mL via INTRAVENOUS

## 2020-01-03 MED ORDER — MIDAZOLAM HCL 2 MG/2ML IJ SOLN: 1.0000 mg | INTRAMUSCULAR | Status: DC | PRN

## 2020-01-03 MED ORDER — NOREPINEPHRINE 4 MG/250ML-% IV SOLN
2.0000 ug/min | INTRAVENOUS | Status: DC
  Administered 2020-01-03: 2 ug/min via INTRAVENOUS
  Filled 2020-01-03: qty 250

## 2020-01-03 MED ORDER — DEXMEDETOMIDINE HCL IN NACL 200 MCG/50ML IV SOLN
0.0000 ug/kg/h | INTRAVENOUS | Status: DC
  Filled 2020-01-03: qty 50

## 2020-01-03 MED ORDER — FAMOTIDINE 20 MG PO TABS
20.0000 mg | ORAL_TABLET | Freq: Every day | ORAL | Status: DC
  Administered 2020-01-03 – 2020-01-13 (×11): 20 mg via ORAL
  Filled 2020-01-03 (×11): qty 1

## 2020-01-03 MED ORDER — FENTANYL CITRATE (PF) 100 MCG/2ML IJ SOLN
50.0000 ug | INTRAMUSCULAR | Status: DC | PRN
  Administered 2020-01-03 – 2020-01-10 (×8): 50 ug via INTRAVENOUS
  Filled 2020-01-03 (×8): qty 2

## 2020-01-03 MED ORDER — DOCUSATE SODIUM 100 MG PO CAPS: 100.0000 mg | ORAL_CAPSULE | Freq: Two times a day (BID) | ORAL | Status: DC

## 2020-01-03 NOTE — Progress Notes (Signed)
eLink Physician-Brief Progress Note Patient Name: Marco [name] DOB: [birth year] MRN: [id number]   Date of Service  01/03/2020  HPI/Events of Note  K 3.2, has OG  eICU Interventions  Kcl liquid 20 ml via OG tube. Already gets Klor con 20 daily at 10 AM. Cr 2.13     Intervention Category Intermediate Interventions: Electrolyte abnormality - evaluation and management  Elmer Sow 01/03/2020, 6:28 AM

## 2020-01-03 NOTE — Progress Notes (Signed)
Assessment:  1. Possibly infected right renal cyst:  Although is right renal cyst showed inflammatory changes when compared with previous imaging studies this could be due to an infectious etiology but also if he there was bleeding into the cyst.  When it was drained that revealed before consistent with hemorrhage into the cyst.  Gram stain showed numerous PMN's but also a few gram-negative rods.  His culture remains pending.  He is on broad-spectrum antibiotics.  The drain output is minimal as to be expected.  2. Fever and leukocytosis:  The search for the source the has revealed some heterogeneity of the left testicle by ultrasound but no abscess.  The left testicle is tender but not swollen or indurated on examination and his urine culture on admission was found to be negative for growth.  He also underwent an MRI scan to evaluate the prostate for the possibility of a prosthetic abscess.  While final reading is pending my review of the scan does not appear to show a prostatic abscess.   Plan:  Continue supportive care and broad-spectrum antibiotics while awaiting further culture results.   Subjective: Patient remains intubated.  He is arousable.  Objective: Vital signs in last 24 hours: Temp:  [98.3 F (36.8 C)-102.7 F (39.3 C)] 100.4 F (38 C) (05/25 0400) Pulse Rate:  [75-99] 83 (05/25 0600) Resp:  [18-29] 21 (05/25 0600) BP: (71-197)/(36-164) 79/45 (05/25 0647) SpO2:  [90 %-100 %] 95 % (05/25 0600) FiO2 (%):  [40 %-100 %] 40 % (05/25 0600) Weight:  [101.3 kg] 101.3 kg (05/24 1505)A  Intake/Output from previous day: 05/24 0701 - 05/25 0700 In: 1362.2 [I.V.:80.6; NG/GT:40.3; IV Piggyback:1241.2] Out: 670 [Urine:650; Drains:20] Intake/Output this shift: No intake/output data recorded.  Past Medical History:  Diagnosis Date  . Benign colon polyp 08/01/2013   [facility] in [place]. large base tranverse colon polyp was biopsied. polyp was benign with minimal  surface hyperplastic change.  . Chronic combined systolic and diastolic CHF (congestive heart failure) ([place])   . Chronic pain   . CKD (chronic kidney disease), stage III   . Diabetes mellitus without complication ([place])   . Diverticulosis   . Esophageal hiatal hernia 07/29/2013   confirmed on EGD   . Esophageal stricture   . Essential hypertension   . Gastritis 07/29/2013   confirmed on EGD, bx done an negative for intestinal metaplasia, dsyplasia or H. pylori. normal gastric emptying study done 07/13/2013.  [name] GERD (gastroesophageal reflux disease)   . Morbid obesity ([place])   . Non-obstructive CAD    a. 02/2013 Cath ([place]): nonobs dzs;  b. 09/2014 Myoview ([place]): EF 55%, no ischemia;  c. Cath 05/2018 mild CAD no obstruction.  . Persistent atrial fibrillation ([place])    a. 02/2013 s/p rfca in [place], [place]-->prev on Xarelto, d/c'd 2/2 anemia, ? GIB.  [name] Rheumatoid arthritis ([place])   . Sigmoid diverticulosis 08/01/2013   confirmed on colonscopy. record scanned into chart    Physical Exam:  GU:  Condom catheter on phallus draining clear urine.  Scrotum normal without fluctuance, crepitus or erythema.  Testicles palpably normal with tenderness to palpation of the left testicle.  No fluctuance noted.  Lab Results: Recent Labs    01/01/20 1845 01/02/20 0521 01/03/20 0444  WBC 12.2* 16.4* 19.0*  HGB 10.5* 10.2* 8.5*  HCT 33.0* 32.4* 27.7*   BMET Recent Labs    01/02/20 0521 01/03/20 0444  NA 148* 146*  K 3.6 3.2*  CL 108  111  CO2 28 27  GLUCOSE 135* 186*  BUN 33* 44*  CREATININE 2.09* 2.13*  CALCIUM 8.1* 7.8*   No results for input(s): LABURIN in the last 72 hours. Results for orders placed or performed during the hospital encounter of 12/27/19  SARS Coronavirus 2 by RT PCR (hospital order, performed in [facility] hospital lab) Nasopharyngeal Nasopharyngeal Swab     Status: None   Collection Time: 12/27/19  4:44 AM   Specimen: Nasopharyngeal Swab  Result Value Ref Range Status    SARS Coronavirus 2 NEGATIVE NEGATIVE Final    Comment: (NOTE) SARS-CoV-2 target nucleic acids are NOT DETECTED. The SARS-CoV-2 RNA is generally detectable in upper and lower respiratory specimens during the acute phase of infection. The lowest concentration of SARS-CoV-2 viral copies this assay can detect is 250 copies / mL. A negative result does not preclude SARS-CoV-2 infection and should not be used as the sole basis for treatment or other patient management decisions.  A negative result may occur with improper specimen collection / handling, submission of specimen other than nasopharyngeal swab, presence of viral mutation(s) within the areas targeted by this assay, and inadequate number of viral copies (<250 copies / mL). A negative result must be combined with clinical observations, patient history, and epidemiological information. Fact Sheet for Patients:   StrictlyIdeas.no Fact Sheet for Healthcare Providers: BankingDealers.co.za This test is not yet approved or cleared  by the [place] FDA and has been authorized for detection and/or diagnosis of SARS-CoV-2 by FDA under an Emergency Use Authorization (EUA).  This EUA will remain in effect (meaning this test can be used) for the duration of the COVID-19 declaration under Section 564(b)(1) of the Act, 21 U.S.C. section 360bbb-3(b)(1), unless the authorization is terminated or revoked sooner. Performed at [facility], [place] [address] [place], [place] [address]   Urine culture     Status: None   Collection Time: 12/27/19  4:44 AM   Specimen: Urine, Clean Catch  Result Value Ref Range Status   Specimen Description   Final    URINE, CLEAN CATCH Performed at [facility], [place] [address], [place], [place] [address]    Special Requests   Final    NONE Performed at [facility], [place] [address], [place], [place] [address]     Culture   Final    NO GROWTH Performed at [facility], [place] [address] [place], [place] [address]    Report Status 12/28/2019 FINAL  Final  Blood culture (routine x 2)     Status: None   Collection Time: 12/27/19  7:01 AM   Specimen: BLOOD  Result Value Ref Range Status   Specimen Description   Final    BLOOD RIGHT ANTECUBITAL Performed at [place] [address], [place], [place] [address]    Special Requests   Final    BOTTLES DRAWN AEROBIC AND ANAEROBIC Blood Culture adequate volume Performed at [place] [address] [place], [place] [address]    Culture   Final    NO GROWTH 5 DAYS Performed at [facility], [place] [address], [place], [place] [address]    Report Status 01/01/2020 FINAL  Final  Blood culture (routine x 2)     Status: None   Collection Time: 12/27/19  7:27 AM   Specimen: BLOOD  Result Value Ref Range Status   Specimen Description   Final    BLOOD LEFT  ANTECUBITAL Performed at [facility], [place] [address], [place], [place] [address]    Special Requests   Final    BOTTLES DRAWN AEROBIC AND ANAEROBIC Blood Culture adequate volume Performed at [place] [address] [place], [place] [address]    Culture   Final    NO GROWTH 5 DAYS Performed at [facility], [place] [address], [place], [place] [address]    Report Status 01/01/2020 FINAL  Final  Culture, blood (routine x 2)     Status: None (Preliminary result)   Collection Time: 12/29/19 10:06 AM   Specimen: BLOOD  Result Value Ref Range Status   Specimen Description   Final    BLOOD RIGHT ARM Performed at [place] [address], [place], [place] [address]    Special Requests   Final    BOTTLES DRAWN AEROBIC AND ANAEROBIC Blood Culture adequate volume Performed at [place] [address], [place], [place] [address]    Culture   Final    NO  GROWTH 4 DAYS Performed at [facility], [place] [address], [place], [place] [address]    Report Status PENDING  Incomplete  Culture, blood (routine x 2)     Status: None (Preliminary result)   Collection Time: 12/29/19 10:07 AM   Specimen: BLOOD  Result Value Ref Range Status   Specimen Description   Final    BLOOD RIGHT ARM Performed at [place] [address], [place], [place] [address]    Special Requests   Final    BOTTLES DRAWN AEROBIC AND ANAEROBIC Blood Culture adequate volume Performed at [place] [address] [place], [place] [address]    Culture   Final    NO GROWTH 4 DAYS Performed at [facility], [place] [address] [place], [place] [address]    Report Status PENDING  Incomplete  MRSA PCR Screening     Status: None   Collection Time: 12/30/19  1:14 PM   Specimen: Nasal Mucosa; Nasopharyngeal  Result Value Ref Range Status   MRSA by PCR NEGATIVE NEGATIVE Final    Comment:        The GeneXpert MRSA Assay (FDA approved for NASAL specimens only), is one component of a comprehensive MRSA colonization surveillance program. It is not intended to diagnose MRSA infection nor to guide or monitor treatment for MRSA infections. Performed at [facility], [place] [address], [place], [place] [address]   Aerobic/Anaerobic Culture (surgical/deep wound)     Status: None (Preliminary result)   Collection Time: 01/01/20  3:14 PM   Specimen: Abscess  Result Value Ref Range Status   Specimen Description   Final    ABSCESS Performed at [place] [address] [place], [place] [address]    Special Requests   Final    RIGHT RENAL CYST Performed at [place] [address] [place], [place] [address]    Gram Stain   Final    FEW WBC PRESENT, PREDOMINANTLY PMN NO ORGANISMS SEEN Performed at [facility], [place] [address], [place], [place] [address]     Culture FEW GRAM NEGATIVE RODS  Final   Report Status PENDING  Incomplete    Studies/Results: US SCROTUM  Result Date: 01/02/2020 CLINICAL DATA:  Scrotal anomaly bilateral inguinal hernia EXAM: ULTRASOUND OF SCROTUM TECHNIQUE: Complete ultrasound examination of the testicles, epididymis, and other scrotal structures  was performed. COMPARISON:  12/27/2019 FINDINGS: Right testicle Measurements: 3 x 1.6 x 2.7 cm. No mass or microlithiasis visualized. Left testicle Measurements: 3.4 x 1.9 by 3 cm. Heterogeneous echotexture without discrete mass. Right epididymis:  Heterogenous and slightly edematous.  No mass. Left epididymis: Heterogenous in appearance. Small cyst measuring 5 mm. Hydrocele:  Trace right greater than left hydrocele. Varicocele:  None visualized. Echogenic tissue within the bilateral inguinal regions presumably due to fat containing inguinal hernias seen on CT. IMPRESSION: 1. Overall heterogeneous echotexture of the left testis but without well-defined fluid collection within either testis to suggest abscess. 2. Trace bilateral hydroceles 3. Soft tissue echogenicity within the inguinal regions bilaterally, likely corresponding to fat filled inguinal hernias on CT. Scrotal anomaly please pick correct template. Electronically Signed   By: Donavan Foil M.D.   On: 01/02/2020 23:35   DG CHEST PORT 1 VIEW  Result Date: 01/03/2020 CLINICAL DATA:  Respiratory failure EXAM: PORTABLE CHEST 1 VIEW COMPARISON:  Radiograph 01/02/2020, CT 05/17/2019 FINDINGS: Endotracheal tube terminates in the mid to upper trachea, 6 cm from the carina. Transesophageal tube tip terminates below the level of imaging, beyond the GE junction. Telemetry leads and support devices overlie the chest. Gradient opacity and pleural thickening in the right lung base compatible with layering pleural effusion. Suspect a small left effusion as well with milder atelectatic changes in the left lower lung. Stable cardiomegaly with a  calcified aorta. No acute osseous or soft tissue abnormality. Degenerative changes are present in the imaged spine and shoulders. IMPRESSION: 1. Endotracheal tube terminates in the mid to upper trachea, 6 cm from the carina. 2. Transesophageal tube tip terminates below the level of imaging, beyond the GE junction. 3. Stable appearance of the moderate right and trace left effusions with likely areas of adjacent atelectasis. Underlying consolidation is not excluded. 4. Stable cardiomegaly. Electronically Signed   By: Lovena Le M.D.   On: 01/03/2020 06:36   EEG adult  Result Date: 01/02/2020 Lora Havens, MD     01/02/2020  9:22 AM Patient Name: Zymire Mikes MRN: [id number] Epilepsy Attending: Lora Havens Referring Physician/Provider: Dr. Shelly Coss Date: 01/02/2020 Duration: 25.52 mins Patient history: 64 year old male with altered mental status.  EEG evaluate for seizures. Level of alertness: Comatose/sedated AEDs during EEG study: Propofol Technical aspects: This EEG study was done with scalp electrodes positioned according to the 10-20 International system of electrode placement. Electrical activity was acquired at a sampling rate of 500Hz  and reviewed with a high frequency filter of 70Hz  and a low frequency filter of 1Hz . EEG data were recorded continuously and digitally stored. Description: EEG showed continuous generalized polymorphic 3 to 6 Hz theta-delta slowing.  EEG was reactive to tactile stimulation. Hyperventilation and photic stimulation were not performed.   ABNORMALITY -Continuous slow, generalized IMPRESSION: This study is suggestive of severe diffuse encephalopathy, nonspecific etiology but could be related to sedation. No seizures or epileptiform discharges were seen throughout the recording. Priyanka Asencion Noble C Breeann Reposa 01/03/2020, 7:19 AM

## 2020-01-03 NOTE — Progress Notes (Signed)
Referring Physician(s): Keene Breath  Supervising Physician: Jacqulynn Cadet  Patient Status:  Marco [name] - In-pt  Chief Complaint: Abdominal pain, recent fevers, hemorrhagic right renal cyst   Subjective: Pt remains intubated, eyes open; does respond with nods to questions; does report some abd soreness   Allergies: Nsaids  Medications: Prior to Admission medications   Medication Sig Start Date End Date Taking? Authorizing Provider  albuterol (PROVENTIL HFA;VENTOLIN HFA) 108 (90 Base) MCG/ACT inhaler Inhale 2 puffs into the lungs every 6 (six) hours as needed for wheezing or shortness of breath. 04/29/18  Yes [name], MD  allopurinol (ZYLOPRIM) 100 MG tablet Take 200 mg by mouth every evening.    Yes [provider]  atorvastatin (LIPITOR) 40 MG tablet Take 1 tablet (40 mg total) by mouth daily. 09/27/19  Yes Sueanne Margarita, MD  carvedilol (COREG) 12.5 MG tablet Take 1 tablet (12.5 mg total) by mouth 2 (two) times daily with a meal. 09/12/19  Yes Larey Dresser, MD  diclofenac Sodium (VOLTAREN) 1 % GEL Apply 4 g topically 4 (four) times daily. 10/23/19  Yes Darr, Marguerita Beards, [name]  DULoxetine (CYMBALTA) 20 MG capsule TAKE 1 CAPSULE BY MOUTH ONCE DAILY Patient taking differently: Take 20 mg by mouth daily.  12/21/19  Yes [name], MD  EQ ASPIRIN ADULT LOW DOSE 81 MG EC tablet TAKE 1 TABLET BY MOUTH ONCE DAILY. SWALLOW WHOLE Patient taking differently: Take 81 mg by mouth at bedtime.  08/01/19  Yes [name], MD  famotidine (PEPCID) 20 MG tablet TAKE 1 TABLET BY MOUTH TWO TIMES DAILY Patient taking differently: Take 20 mg by mouth 2 (two) times daily.  12/21/19  Yes [name], MD  fluticasone (FLONASE) 50 MCG/ACT nasal spray Place 2 sprays into both nostrils daily. 09/16/19  Yes [name], MD  furosemide (LASIX) 40 MG tablet Take 1 tablet (40 mg total) by mouth daily. Can take extra as needed 12/16/19 12/15/20 Yes Bensimhon, Shaune Pascal, MD    Insulin Lispro Prot & Lispro (HUMALOG MIX 75/25 KWIKPEN) (75-25) 100 UNIT/ML Kwikpen Inject 100 Units into the skin daily with breakfast. 12/20/19  Yes Renato Shin, MD  oxyCODONE (OXY IR/ROXICODONE) 5 MG immediate release tablet Take 5 mg by mouth every 6 (six) hours. 11/09/19  Yes [provider]  potassium chloride SA (KLOR-CON) 20 MEQ tablet Take 20 mEq by mouth daily. 12/05/19  Yes [provider]  predniSONE (DELTASONE) 5 MG tablet Take 5 mg by mouth daily. 11/23/19  Yes [provider]  pregabalin (LYRICA) 100 MG capsule Take 100 mg by mouth 3 (three) times daily.    Yes [provider]  sacubitril-valsartan (ENTRESTO) 49-51 MG Take 1 tablet by mouth 2 (two) times daily. Patient taking differently: Take 0.5 tablets by mouth 2 (two) times daily.  12/16/19  Yes Bensimhon, Shaune Pascal, MD  Secukinumab (COSENTYX) 150 MG/ML SOSY Inject 150 mg into the skin every 28 (twenty-eight) days.  06/02/16  Yes [provider]  Tafamidis (VYNDAMAX) 61 MG CAPS Take 61 mg by mouth daily. 09/08/19  Yes Bensimhon, Shaune Pascal, MD  zolpidem (AMBIEN CR) 6.25 MG CR tablet TAKE 1 TABLET BY MOUTH EVERY DAY AT BEDTIME AS NEEDED FOR SLEEP Patient taking differently: Take 6.25 mg by mouth at bedtime.  12/02/19  Yes [name], MD  Accu-Chek FastClix Lancets MISC Use one strip to monitor glucose levels BID; E11.42 12/06/18   Renato Shin, MD  glucose blood (ACCU-CHEK GUIDE) test  strip Use one strip to monitor glucose levels BID; E11.42 12/06/18   Renato Shin, MD  Insulin Pen Needle (PEN NEEDLES) 31G X 6 MM MISC 1 each by Does not apply route daily. E11.9 10/19/19   Renato Shin, MD     Vital Signs: BP (!) 121/54   Pulse 79   Temp 98.6 F (37 C) (Oral)   Resp 20   Ht 5\' 6"  (1.676 m)   Wt 223 lb 5.2 oz (101.3 kg)   SpO2 98%   BMI 36.05 kg/m   Physical Exam intubated; eyes open, responsive; rt renal drain intact, OP 20 cc bloody fluid, insertion site mildly  tender  Imaging: DG Abd 1 View  Result Date: 01/01/2020 CLINICAL DATA:  Orogastric tube placement. EXAM: ABDOMEN - 1 VIEW COMPARISON:  Dec 10, 2019 FINDINGS: Enteric catheter follows the expected location of the gastric body terminating in the area of the antrum. Nonobstructive bowel gas pattern. Pigtail catheter overlies the right upper quadrant of the abdomen. Left renal calculus is again noted. IMPRESSION: Enteric catheter follows the expected location of the gastric body terminating in the area of the antrum. Electronically Signed   By: Fidela Salisbury M.D.   On: 01/01/2020 19:05   MR BRAIN WO CONTRAST  Result Date: 12/30/2019 CLINICAL DATA:  Encephalopathy.  Sepsis. EXAM: MRI HEAD WITHOUT CONTRAST TECHNIQUE: Multiplanar, multiecho pulse sequences of the brain and surrounding structures were obtained without intravenous contrast. COMPARISON:  Head CT 12/27/2019 FINDINGS: Brain: Diffusion imaging does not show any acute or subacute infarction or other cause of restricted diffusion. No focal abnormality affects the brainstem or cerebellum. Cerebral hemispheres show minimal small vessel change of the white matter. No cortical or large vessel territory infarction. No mass lesion, hemorrhage, hydrocephalus or extra-axial collection. Vascular: Major vessels at the base of the brain show flow. Skull and upper cervical spine: Negative Sinuses/Orbits: Opacification of the left maxillary sinus, the sphenoid sinus and some of the left ethmoid sinuses. Internal material is hypointense on T2 imaging. This could be chronic inspissated mucus or chronic fungal sinusitis. Other: None IMPRESSION: No significant intracranial finding. No acute infarction or other acute process. Sinus disease primarily affecting the left maxillary and sphenoid sinus. The sinuses could contain chronic inspissated mucus or could be affected by fungal sinusitis. Electronically Signed   By: Nelson Chimes M.D.   On: 12/30/2019 15:34   US  SCROTUM  Result Date: 01/02/2020 CLINICAL DATA:  Scrotal anomaly bilateral inguinal hernia EXAM: ULTRASOUND OF SCROTUM TECHNIQUE: Complete ultrasound examination of the testicles, epididymis, and other scrotal structures was performed. COMPARISON:  12/27/2019 FINDINGS: Right testicle Measurements: 3 x 1.6 x 2.7 cm. No mass or microlithiasis visualized. Left testicle Measurements: 3.4 x 1.9 by 3 cm. Heterogeneous echotexture without discrete mass. Right epididymis:  Heterogenous and slightly edematous.  No mass. Left epididymis: Heterogenous in appearance. Small cyst measuring 5 mm. Hydrocele:  Trace right greater than left hydrocele. Varicocele:  None visualized. Echogenic tissue within the bilateral inguinal regions presumably due to fat containing inguinal hernias seen on CT. IMPRESSION: 1. Overall heterogeneous echotexture of the left testis but without well-defined fluid collection within either testis to suggest abscess. 2. Trace bilateral hydroceles 3. Soft tissue echogenicity within the inguinal regions bilaterally, likely corresponding to fat filled inguinal hernias on CT. Scrotal anomaly please pick correct template. Electronically Signed   By: Donavan Foil M.D.   On: 01/02/2020 23:35   DG CHEST PORT 1 VIEW  Result Date: 01/03/2020 CLINICAL DATA:  Respiratory  failure EXAM: PORTABLE CHEST 1 VIEW COMPARISON:  Radiograph 01/02/2020, CT 05/17/2019 FINDINGS: Endotracheal tube terminates in the mid to upper trachea, 6 cm from the carina. Transesophageal tube tip terminates below the level of imaging, beyond the GE junction. Telemetry leads and support devices overlie the chest. Gradient opacity and pleural thickening in the right lung base compatible with layering pleural effusion. Suspect a small left effusion as well with milder atelectatic changes in the left lower lung. Stable cardiomegaly with a calcified aorta. No acute osseous or soft tissue abnormality. Degenerative changes are present in the  imaged spine and shoulders. IMPRESSION: 1. Endotracheal tube terminates in the mid to upper trachea, 6 cm from the carina. 2. Transesophageal tube tip terminates below the level of imaging, beyond the GE junction. 3. Stable appearance of the moderate right and trace left effusions with likely areas of adjacent atelectasis. Underlying consolidation is not excluded. 4. Stable cardiomegaly. Electronically Signed   By: Lovena Le M.D.   On: 01/03/2020 06:36   DG CHEST PORT 1 VIEW  Result Date: 01/02/2020 CLINICAL DATA:  Intubation EXAM: PORTABLE CHEST 1 VIEW COMPARISON:  Radiograph 01/01/2020, CT 05/17/2019 FINDINGS: Endotracheal tube in the mid trachea, 3.4 cm from the carina. Transesophageal tube tip is distal to the GE junction, terminating below the level of imaging. Telemetry leads overlie the chest. Gradient opacity and pleural thickening in the right lung base is suspicious for layering pleural effusion likely with adjacent atelectatic change though some underlying consolidation is not excluded. There is slight blunting of the left cardiophrenic sulcus as well which may reflect additional trace pleural fluid. Further atelectatic changes also present in the left lung base. Stable cardiomegaly with a calcified, tortuous aorta. No acute osseous or soft tissue abnormality. IMPRESSION: 1. Gradient opacity and pleural thickening in the right lung base is suspicious for layering pleural effusion likely with adjacent atelectatic change though some underlying consolidation is not excluded. 2. Suspect trace left pleural fluid. 3. Stable cardiomegaly. 4.  Aortic Atherosclerosis (ICD10-I70.0). Electronically Signed   By: Lovena Le M.D.   On: 01/02/2020 05:46   DG Chest Port 1 View  Result Date: 01/01/2020 CLINICAL DATA:  Repositioning of ET tube. EXAM: PORTABLE CHEST 1 VIEW COMPARISON:  Jan 01, 2020 FINDINGS: The ETT terminates in the mid trachea in good position. The NG tube terminates below today's film. No  pneumothorax. Mild opacity remains in the right base. No other interval changes. IMPRESSION: 1. The ETT is in good position. The NG tube terminates below today's film. 2. Linear opacity in the right base is likely either fluid in a fissure or atelectasis. Mild haziness over the right base is favored represent atelectasis rather than infiltrate given the waxing and waning appearance over the last couple of chest x-rays. Recommend attention on follow-up. Electronically Signed   By: Dorise Bullion III M.D   On: 01/01/2020 20:23   Portable Chest x-ray  Result Date: 01/01/2020 CLINICAL DATA:  ETT placement.  OG tube placement. EXAM: PORTABLE CHEST 1 VIEW COMPARISON:  Jan 01, 2020 FINDINGS: The ETT terminates 1 cm above the carina. Recommend withdrawing 2 cm. No pneumothorax. The OG tube terminates below today's film. Mild opacity in the right base favored represent atelectasis. No other interval changes. IMPRESSION: 1. The ETT terminates 1 cm above the carina. Recommend withdrawing 2 cm. 2. The OG tube terminates below today's film. 3. Mild atelectasis in the right base. These results will be called to the ordering clinician or representative by the Radiologist  Environmental consultant, and communication documented in the PACS or [employer]. Electronically Signed   By: Dorise Bullion III M.D   On: 01/01/2020 19:05   DG CHEST PORT 1 VIEW  Result Date: 01/01/2020 CLINICAL DATA:  Hypoxemia. EXAM: PORTABLE CHEST 1 VIEW COMPARISON:  Jan 01, 2020 FINDINGS: Stable cardiomegaly. The hila and mediastinum are normal. No pneumothorax. Mild opacity in the right base is new in the interval. No other acute abnormalities. IMPRESSION: Mild opacity in the right base may represent atelectasis or developing infiltrate. Recommend short-term follow-up imaging to ensure resolution. No other abnormalities. Electronically Signed   By: Dorise Bullion III M.D   On: 01/01/2020 18:25   DG CHEST PORT 1 VIEW  Result Date: 01/01/2020 CLINICAL  DATA:  Acute respiratory failure. EXAM: PORTABLE CHEST 1 VIEW COMPARISON:  Dec 31, 2019 FINDINGS: Mild, stable diffusely increased lung markings are seen. Very mild right infrahilar atelectasis is noted. There is no evidence of a pleural effusion or pneumothorax. The cardiac silhouette is moderately enlarged and unchanged in size. The visualized skeletal structures are unremarkable. IMPRESSION: 1. Very mild right infrahilar atelectasis. 2. Stable cardiomegaly. 3. Stable diffusely increased lung markings which may represent mild pulmonary edema. Electronically Signed   By: Virgina Norfolk M.D.   On: 01/01/2020 15:02   DG CHEST PORT 1 VIEW  Result Date: 12/31/2019 CLINICAL DATA:  64 year old male with a history of shortness of breath EXAM: PORTABLE CHEST 1 VIEW COMPARISON:  12/29/2019 FINDINGS: Cardiomediastinal silhouette unchanged with cardiomegaly. Interlobular septal thickening. Mild patchy airspace opacity in the lower lungs slightly increased from the comparison. No pneumothorax or pleural effusion. IMPRESSION: Evidence of early pulmonary edema and basilar atelectasis. Electronically Signed   By: Corrie Mckusick D.O.   On: 12/31/2019 09:43   EEG adult  Result Date: 01/02/2020 Lora Havens, MD     01/02/2020  9:22 AM Patient Name: Marco [name] MRN: [id number] Epilepsy Attending: Lora Havens Referring Physician/Provider: Dr. Shelly Coss Date: 01/02/2020 Duration: 25.52 mins Patient history: 64 year old male with altered mental status.  EEG evaluate for seizures. Level of alertness: Comatose/sedated AEDs during EEG study: Propofol Technical aspects: This EEG study was done with scalp electrodes positioned according to the 10-20 International system of electrode placement. Electrical activity was acquired at a sampling rate of 500Hz  and reviewed with a high frequency filter of 70Hz  and a low frequency filter of 1Hz . EEG data were recorded continuously and digitally stored. Description: EEG showed  continuous generalized polymorphic 3 to 6 Hz theta-delta slowing.  EEG was reactive to tactile stimulation. Hyperventilation and photic stimulation were not performed.   ABNORMALITY -Continuous slow, generalized IMPRESSION: This study is suggestive of severe diffuse encephalopathy, nonspecific etiology but could be related to sedation. No seizures or epileptiform discharges were seen throughout the recording. Lora Havens   ECHOCARDIOGRAM COMPLETE  Result Date: 12/31/2019    ECHOCARDIOGRAM REPORT   Patient Name:   Marco [name] Date of Exam: 12/31/2019 Medical Rec #:  [id number]     Height:       66.0 in Accession #:    [id number]    Weight:       238.8 lb Date of Birth:  Jan 25, 1956     BSA:          2.156 m Patient Age:    2 years      BP:           180/78 mmHg Patient Gender: M  HR:           95 bpm. Exam Location:  Inpatient Procedure: 2D Echo Indications:    fever 780.6  History:        Patient has prior history of Echocardiogram examinations. CHF,                 CAD, Arrythmias:Atrial Fibrillation; Risk Factors:Diabetes,                 Hypertension and Former Smoker.  Sonographer:    Jannett Celestine RDCS (AE) Referring Phys: [id number]  Sonographer Comments: Image acquisition challenging due to respiratory motion. restricted mobility. patient on BiPap machine during exam IMPRESSIONS  1. Mild hypokinesis of the distal septum and apex. Left ventricular ejection fraction, by estimation, is 50 to 55%. The left ventricle has low normal function. The left ventricle demonstrates regional wall motion abnormalities (see scoring diagram/findings for description). There is mild concentric left ventricular hypertrophy. Left ventricular diastolic parameters are consistent with Grade I diastolic dysfunction (impaired relaxation).  2. Right ventricular systolic function is normal. The right ventricular size is normal.  3. Right atrial size was mildly dilated.  4. The mitral valve is normal in  structure. No evidence of mitral valve regurgitation. No evidence of mitral stenosis.  5. The aortic valve is tricuspid. Aortic valve regurgitation is not visualized. No aortic stenosis is present.  6. The inferior vena cava is normal in size with greater than 50% respiratory variability, suggesting right atrial pressure of 3 mmHg. FINDINGS  Left Ventricle: Mild hypokinesis of the distal septum and apex. Left ventricular ejection fraction, by estimation, is 50 to 55%. The left ventricle has low normal function. The left ventricle demonstrates regional wall motion abnormalities. The left ventricular internal cavity size was normal in size. There is mild concentric left ventricular hypertrophy. Left ventricular diastolic parameters are consistent with Grade I diastolic dysfunction (impaired relaxation). Indeterminate filling pressures. Right Ventricle: The right ventricular size is normal. No increase in right ventricular wall thickness. Right ventricular systolic function is normal. Left Atrium: Left atrial size was normal in size. Right Atrium: Right atrial size was mildly dilated. Pericardium: There is no evidence of pericardial effusion. Mitral Valve: The mitral valve is normal in structure. Normal mobility of the mitral valve leaflets. No evidence of mitral valve regurgitation. No evidence of mitral valve stenosis. Tricuspid Valve: The tricuspid valve is normal in structure. Tricuspid valve regurgitation is trivial. No evidence of tricuspid stenosis. Aortic Valve: The aortic valve is tricuspid. Aortic valve regurgitation is not visualized. No aortic stenosis is present. Pulmonic Valve: The pulmonic valve was normal in structure. Pulmonic valve regurgitation is not visualized. No evidence of pulmonic stenosis. Aorta: The aortic root is normal in size and structure. Venous: The inferior vena cava is normal in size with greater than 50% respiratory variability, suggesting right atrial pressure of 3 mmHg. IAS/Shunts:  No atrial level shunt detected by color flow Doppler.  LEFT VENTRICLE PLAX 2D LVIDd:         5.30 cm  Diastology LVIDs:         3.70 cm  LV e' lateral:   6.20 cm/s LV PW:         1.20 cm  LV E/e' lateral: 12.3 LV IVS:        1.20 cm  LV e' medial:    5.77 cm/s LVOT diam:     2.20 cm  LV E/e' medial:  13.2 LV SV:  64 LV SV Index:   30 LVOT Area:     3.80 cm  RIGHT VENTRICLE RV S prime:     15.30 cm/s TAPSE (M-mode): 2.0 cm LEFT ATRIUM             Index       RIGHT ATRIUM           Index LA diam:        3.20 cm 1.48 cm/m  RA Area:     23.10 cm LA Vol (A2C):   49.6 ml 23.00 ml/m RA Volume:   70.00 ml  32.47 ml/m LA Vol (A4C):   45.3 ml 21.01 ml/m LA Biplane Vol: 51.4 ml 23.84 ml/m  AORTIC VALVE LVOT Vmax:   112.00 cm/s LVOT Vmean:  83.800 cm/s LVOT VTI:    0.168 m  AORTA Ao Root diam: 3.20 cm MITRAL VALVE               TRICUSPID VALVE MV Area (PHT): 3.77 cm    TR Peak grad:   28.3 mmHg MV Decel Time: 201 msec    TR Vmax:        266.00 cm/s MV E velocity: 76.30 cm/s MV A velocity: 87.80 cm/s  SHUNTS MV E/A ratio:  0.87        Systemic VTI:  0.17 m                            Systemic Diam: 2.20 cm Skeet Latch MD Electronically signed by Skeet Latch MD Signature Date/Time: 12/31/2019/12:49:48 PM    Final    CT IMAGE GUIDED DRAINAGE BY PERCUTANEOUS CATHETER  Result Date: 01/01/2020 INDICATION: 64 year old with inflammation around a right renal cyst. Concern for an infected right renal cyst. Request for percutaneous drainage. EXAM: CT-GUIDED DRAINAGE OF RIGHT RENAL CYST MEDICATIONS: Moderate sedation ANESTHESIA/SEDATION: 1.0 mg IV Versed 25 mcg IV Fentanyl Moderate Sedation Time:  88 minutes The patient was continuously monitored during the procedure by the interventional radiology nurse under my direct supervision. COMPLICATIONS: None immediate. TECHNIQUE: Informed consent was obtained from the patient's family after a thorough discussion of the procedural risks, benefits and alternatives. All  questions were addressed. Maximal Sterile Barrier Technique was utilized including caps, mask, sterile gowns, sterile gloves, sterile drape, hand hygiene and skin antiseptic. A timeout was performed prior to the initiation of the procedure. PROCEDURE: Patient was placed prone on the CT scanner. Images through the abdomen were obtained. The hyperdense cyst along the posterior aspect of the right kidney was targeted. The right flank was prepped with chlorhexidine and sterile field was created. Skin and soft tissues were anesthetized with 1% lidocaine. Small incision was made. Using CT guidance, an 18 gauge trocar needle was initially directed into the fluid around the cyst. Thick bloody fluid was aspirated. Needle was eventually directed into the hyperdense cyst and additional dark bloody fluid was aspirated. Superstiff Amplatz wire was advanced into the collection and tract was dilated to accommodate a 10 [place] multipurpose drain. However, the follow-up CT images demonstrated that do drain did not stay within the cystic collection but rather went cephalad to the kidney. Pulled the drain back but the side holes were outside of the abdomen. As a result, this drain was completely removed. Therefore, a 18 gauge trocar needle was directed back into the renal cyst using a combination of ultrasound and CT guidance. Wire position was confirmed within the cyst and the tract was dilated to  accommodate a 10 French Bettey Mare catheter. This catheter was felt to be within this cyst and additional bloody fluid was removed. Approximately 30 mL of bloody fluid was removed throughout the procedure. Catheter was attached to a suction bulb and sutured to the skin. Dressing was placed. FINDINGS: Again noted is a hyperdense exophytic structure suggestive for hemorrhagic cyst in the posterior right kidney upper pole. Inflammatory changes and fluid around the cyst are similar to the recent CT from 12/28/2019. It was difficult to place  a drain within this cyst due to the small size and configuration. Eventually, 10 French Bettey Mare catheter was placed within the cyst and a total of 30 mL of bloody fluid was removed from the cyst and surrounding tissues. IMPRESSION: CT-guided placement of a drainage catheter within the hemorrhagic right renal cyst. Fluid was sent for culture. Electronically Signed   By: Markus Daft M.D.   On: 01/01/2020 16:39    Labs:  CBC: Recent Labs    01/01/20 0750 01/01/20 1845 01/02/20 0521 01/03/20 0444  WBC 14.0* 12.2* 16.4* 19.0*  HGB 9.3* 10.5* 10.2* 8.5*  HCT 30.1* 33.0* 32.4* 27.7*  PLT 316 356 378 355    COAGS: Recent Labs    12/08/19 2014 12/30/19 1106  INR 1.3* 1.2  APTT 33  --     BMP: Recent Labs    01/01/20 0750 01/01/20 1845 01/02/20 0521 01/03/20 0444  NA 142 145 148* 146*  K 4.2 4.4 3.6 3.2*  CL 108 107 108 111  CO2 22 25 28 27   GLUCOSE 117* 147* 135* 186*  BUN 25* 29* 33* 44*  CALCIUM 8.0* 8.2* 8.1* 7.8*  CREATININE 1.63* 1.68* 2.09* 2.13*  GFRNONAA 44* 42* 32* 32*  GFRAA 51* 49* 38* 37*    LIVER FUNCTION TESTS: Recent Labs    12/31/19 1203 01/01/20 0750 01/02/20 0521 01/03/20 0444  BILITOT 1.0 1.4* 1.2 1.0  AST 16 17 37 67*  ALT 17 17 30  67*  ALKPHOS 50 49 58 56  PROT 6.7 6.8 7.3 6.6  ALBUMIN 2.2* 2.2* 2.6* 2.2*    Assessment and Plan: Pt with hx sepsis of uncertain origin,AMS, currently intubated; also with hemorrhagic ? infected rt renal cyst, s/p drain placement 5/23; WBC 19(16.4), HGB 8.5(10.2), CREAT 2.13(2.09), K 3.2-replace; drain fluid cx- e coli, blood cx - neg to date; MRI pelvis pend; consider f/u renal imaging /CT later this week to assess drain site if OP remains minimal   Electronically Signed: D. Rowe Robert, [name] 01/03/2020, 10:55 AM   I spent a total of 15 minutes at the the patient's bedside AND on the patient's hospital floor or unit, greater than 50% of which was counseling/coordinating care for right renal cyst  drain    Patient ID: Marco [name], male   DOB: 09-10-1955, 64 y.o.   MRN: [id number]

## 2020-01-03 NOTE — Progress Notes (Addendum)
NAME:  Marco [name], MRN:  [id number], DOB:  [birth year], LOS: 7 ADMISSION DATE:  12/27/2019, CONSULTATION DATE:  5/23 REFERRING MD:  Dr. Elbert Ewings, CHIEF COMPLAINT:  Weakness, lethargy   Brief History   64 y/o M who presented to [facility] on 5/18 with reports of weakness, lethargy and falls. Recent admit from 4/30-5/4 with severe sepsis due to MDR E-Coli right pyelonephritis.  Initially admitted with concern for sepsis with fever, leukocytosis and elevated lactic acid. Work up concerning for possible infected right renal cyst with associated right renal pyelonephritis.  He had progressive agitated delirium, fevers despite antibiotics.  IR drained renal cyst 5/23 with bloody fluid returned.  Ultimately, required intubation on 5/23 for agitated delirium / impending respiratory failure.    Past Medical History  RA  Morbid Obesity  HTN Chronic combined systolic & diastolic CHF Non-obstructive CAD  Persistent AF CKD III  DM  GERD Esophageal stricture Esophageal hernia  Chronic Pain   Significant Hospital Events   5/18 Admit  5/20 Urology consulted for right renal cyst, concern for source of sepsis  5/21 ID consulted. LP recommended but could not be done.  5/22 CCM consulted  5/23 IR consult, R renal cyst drained with 30 ml of bloody fluid removed.  Intubated for agitated delirium.   Consults:  Urology  ID  CCM  IR   Procedures:  ETT 5/23 >>   Significant Diagnostic Tests:   CT Head 5/18 >> no acute intracranial findings or mass lesions, stable severe chronic left maxillary and left half sphenoid sinus disease   US Pelvis 5/18 >> normal exam   CT ABD/Pelvis w/o 5/19 >> small bilateral pleural effusions with mild bibasilar atelectasis, stable 3.9 x 2.9 cm exophytic isodense area along the posterior aspect of the upper pole of the right kidney with moderate to marked amount of surrounding inflammatory fat stranding.  This likely represents a hemorrhagic cyst with associated right sided  pyelonephritis.  Stable left renal cyst.  Bilateral non-obstructing renal stones. Colonic diverticulosis. Bilateral fat containing inguinal hernias.   MRI Brain w/o 5/21 >> no significant intracranial finding, no acute infarction or other acute process. Sinus disease primarily affecting the left maxillary and sphenoid sinus.  The sinuses could contain chronic inspissated mucus or could be fungal sinusitis   ECHO 5/22 >> mild hypokinesis of the distal septum & apex. LVEF 50-55%, LV has low normal function.  LV demonstrates regional wall motion abnormalities.  Mild concentric left ventricular hypertrophy.  Grade I diastolic dysfunction. RV systolic function normal.  RA mildly dilated.  EEG 5/24 >> severe diffuse encephalopathy, nos-specific. No seizure or epileptiform discharges   MRI Pelvis 5/24 >>   Micro Data:  COVID 5/18 >> negative  UA 5/18 >> trace leukocytes, 30 protein, rare bacteria, 0-5 WBC UC 5/18 >> negative  MRSA PCR 5/21 >> negative  BCx2 5/18 >> negative  R Renal Cyst Abscess 5/23 >> E-Coli >> S-imipenem, R-ampicillin, cefazolin, unasyn BCx2 5/20 >>  Crypto Antigen 5/21 >> negative   Antimicrobials:  Ceftriaxone 5/18 >> 5/20  Vanco 5/20 >> 5/25 Meropenem 5/24 >>   Interim history/subjective:  Tmax 100.4  On full support, 40%, 5 PEEP Glucose range 138-186 I/O 2.2L UOP, -1.3L in last 24 hours  RN reports propofol restarted overnight, pt required levophed as well  Objective   Blood pressure (!) 121/54, pulse 79, temperature 98.6 F (37 C), temperature source Oral, resp. rate 20, height 5\' 6"  (1.676 m), weight 101.3 kg, SpO2 99 %.  Vent Mode: PRVC FiO2 (%):  [40 %-100 %] 40 % Set Rate:  [20 bmp] 20 bmp Vt Set:  [510 mL] 510 mL PEEP:  [5 cmH20] 5 cmH20 Pressure Support:  [10 cmH20] 10 cmH20 Plateau Pressure:  [17 cmH20-18 cmH20] 18 cmH20   Intake/Output Summary (Last 24 hours) at 01/03/2020 [id number] Last data filed at 01/03/2020 [id number] Gross per 24 hour  Intake  2653.66 ml  Output 670 ml  Net 1983.66 ml   Filed Weights   12/26/19 2321 12/27/19 1254 01/02/20 1505  Weight: 110.2 kg 108.3 kg 101.3 kg    Examination: General: critically ill appearing, adult male lying in bed in NAD HEENT: MM pink/moist, ETT Neuro: sedate, awakens / follows commands  CV: s1s2 rrr, SR in 80's, no m/r/g PULM: non-labored on vent, lungs bilaterally with scattered rhonchi GI: soft, bsx4 active, right posterior lower abd drain with bloody secretions  Extremities: warm/dry, trace LE edema  Skin: no rashes or lesions  PCXR 5/26 >> images personally reviewed, ETT ~ 6 cm above carina, small right layering effusion  Resolved Hospital Problem list      Assessment & Plan:   Sepsis in setting of Right Pyelonephritis with Infected Cyst / E-Coli Isolated Persistent Fever / Leukocytosis  Hx of E-Coli UTI. Concern for infected right renal cyst with stranding.  -appreciate ID input  -abx as above, per ID. Discussed stopping vancomycin 5/25.  -appreciate Urology, IR input  -Urology assessing left testicle given heterogeneity on [place] but no abscess  -await MRI findings  Acute Respiratory Failure with Hypoxemia in setting of Sepsis, Possible ALI Right Pleural Effusion  OSA  Concern for component pulmonary edema with elevated BNP, effusions. [place] assessment 5/24 with small effusion, not enough for thoracentesis.  -PRVC 8cc/kg as rest mode -advance ETT 2cm -wean PEEP / fiO2 for sats >90% -follow intermittent CXR -daily SBT / WUA  Acute Agitated Delirium / Metabolic Encephalopathy  Documented as unable to get LP, order in place from 5/21.  Doubt we need at this point.  -PAD protocol for RASS Goal 0 to -1  -change sedation to precedex with PRN fentanyl  -delirium prevention measures  -early PT efforts   Left Maxillary Sinus Disease Chronic disease with mucoid impaction vs fungal infection on imaging per radiology. ID does not feel this is fungal infection.  -abx per ID    -follow cultures  CKD IIIa Hypokalemia  -renal dose medications -Trend BMP / urinary output -Replace electrolytes as indicated -Avoid nephrotoxic agents, ensure adequate renal perfusion  Chronic Combined Systolic / Diastolic CHF without Exacerbation  Troponin Leak Persistent AF -on coreg, no anticoagulation prior to admit  Cardiac cat in 2019 with non-ischemic systolic CHF, LVEF [id number]. 10/2019 with normal systolic function. Suspect troponin leak demand ischemia.  -assess lactate -avoid QTc prolonging agents  -continue ASA, lipitor -hold coreg, home entresto   -wean levophed for MAP >65  -prior Cardiology prior notes > previously on xarelto but stopped due to anemia, there was consideration for restarting (12/16/19).  Followed by [name].   Rheumatoid Arthritis  On prednisone, cosentyx, oxycodone at baseline  -continue prednisone PT -continue lyrica   Anemia of Critical Illness  -trend CBC   Mild Elevation of LFT's  Suspect in setting of hypotension -follow LFT trend  -ensure adequate perfusion   Gout  -allopurinol on hold with AKI    At Risk Malnutrition  -TF per Nutrition   Best practice:  Diet: TF Pain/Anxiety/Delirium protocol (if indicated): change to Precedex  VAP protocol (if indicated): In place  DVT prophylaxis: resume prophylaxis dosing heparin GI prophylaxis: pepcid  Glucose control: SSI  Mobility: BR  Code Status: Full Code  Family Communication: Wife Forensic scientist) updated via phone 5/24.  Will update on arrival 5/25.  Disposition: ICU   Labs   CBC: Recent Labs  Lab 12/30/19 0512 12/30/19 0512 12/31/19 0230 12/31/19 0230 12/31/19 1203 01/01/20 0750 01/01/20 1845 01/02/20 0521 01/03/20 0444  WBC 16.8*   < > 15.8*   < > 15.0* 14.0* 12.2* 16.4* 19.0*  NEUTROABS 12.7*  --  11.7*  --   --  10.0*  --  11.0* 14.5*  HGB 9.7*   < > 9.4*   < > 8.8* 9.3* 10.5* 10.2* 8.5*  HCT 30.1*   < > 30.3*   < > 28.3* 30.1* 33.0* 32.4* 27.7*  MCV 90.4   < >  92.9   < > 94.0 93.2 92.4 92.8 92.0  PLT 267   < > 278   < > 284 316 356 378 355   < > = values in this interval not displayed.    Basic Metabolic Panel: Recent Labs  Lab 12/31/19 1203 01/01/20 0750 01/01/20 1845 01/01/20 2045 01/02/20 0521 01/02/20 1325 01/02/20 1649 01/03/20 0444  NA 143 142 145  --  148*  --   --  146*  K 4.0 4.2 4.4  --  3.6  --   --  3.2*  CL 109 108 107  --  108  --   --  111  CO2 25 22 25   --  28  --   --  27  GLUCOSE 120* 117* 147*  --  135*  --   --  186*  BUN 22 25* 29*  --  33*  --   --  44*  CREATININE 1.73* 1.63* 1.68*  --  2.09*  --   --  2.13*  CALCIUM 7.7* 8.0* 8.2*  --  8.1*  --   --  7.8*  MG 2.1  --   --  2.0  --  2.0 2.1 2.2  PHOS 2.8  --   --   --   --  1.9* 1.9* 2.4*   GFR: Estimated Creatinine Clearance: 39.1 mL/min (A) (by C-G formula based on SCr of 2.13 mg/dL (H)). Recent Labs  Lab 12/27/19 1008 12/28/19 0440 12/31/19 1203 12/31/19 1203 01/01/20 0750 01/01/20 1845 01/01/20 1846 01/01/20 2045 01/02/20 0521 01/03/20 0444  PROCALCITON  --   --  0.30  --  0.35  --   --   --  5.11  --   WBC  --    < > 15.0*   < > 14.0* 12.2*  --   --  16.4* 19.0*  LATICACIDVEN 1.1  --  1.0  --   --   --  2.3* 2.7*  --   --    < > = values in this interval not displayed.    Liver Function Tests: Recent Labs  Lab 12/31/19 1203 01/01/20 0750 01/02/20 0521 01/03/20 0444  AST 16 17 37 67*  ALT 17 17 30  67*  ALKPHOS 50 49 58 56  BILITOT 1.0 1.4* 1.2 1.0  PROT 6.7 6.8 7.3 6.6  ALBUMIN 2.2* 2.2* 2.6* 2.2*   No results for input(s): LIPASE, AMYLASE in the last 168 hours. No results for input(s): AMMONIA in the last 168 hours.  ABG    Component Value Date/Time   PHART 7.453 (H) 01/02/2020 0355  PCO2ART 36.9 01/02/2020 0355   PO2ART 110 (H) 01/02/2020 0355   HCO3 24.6 01/02/2020 0355   TCO2 25 05/19/2018 1430   ACIDBASEDEF 3.3 (H) 12/31/2019 0908   O2SAT 97.1 01/02/2020 0355     Coagulation Profile: Recent Labs  Lab  12/30/19 1106  INR 1.2    Cardiac Enzymes: No results for input(s): CKTOTAL, CKMB, CKMBINDEX, TROPONINI in the last 168 hours.  HbA1C: HbA1c, POC (controlled diabetic range)  Date/Time Value Ref Range Status  05/02/2019 01:55 PM 7.2 (A) 0.0 - 7.0 % Final  01/29/2018 03:38 PM 10.2 (A) 0.0 - 7.0 % Final   Hgb A1c MFr Bld  Date/Time Value Ref Range Status  12/08/2019 08:00 PM 8.4 (H) 4.8 - 5.6 % Final    Comment:    (NOTE) Pre diabetes:          5.7%-6.4% Diabetes:              >6.4% Glycemic control for   <7.0% adults with diabetes   09/26/2019 11:16 AM 9.2 (H) 4.8 - 5.6 % Final    Comment:    (NOTE) Pre diabetes:          5.7%-6.4% Diabetes:              >6.4% Glycemic control for   <7.0% adults with diabetes     CBG: Recent Labs  Lab 01/02/20 1611 01/02/20 1948 01/03/20 0006 01/03/20 0427 01/03/20 0739  GLUCAP 128* 184* 144* 163* 138*     Critical care time: 78 minutes     Noe Gens, MSN, NP-C [facility] 01/03/2020, 8:32 AM   Please see [email or web address] for pager details.

## 2020-01-03 NOTE — Progress Notes (Signed)
eLink Physician-Brief Progress Note Patient Name: Marco [name] DOB: [birth year] MRN: [id number]   Date of Service  01/03/2020  HPI/Events of Note  Fluid responsive hypotension from sepsis. MAP down again. On 40% fio2 on Vent. EF normal. PVC's stable.  US scrotum: hydrocele. No obvious abscess. Fat containing inguinal hernia possible.   eICU Interventions  - LR 500 ml bolus. - Levophed gtt low dose via mid line for now to keep MAP > 65. Consider central and art line early in AM.   Discussed with bed side RN.     Intervention Category Intermediate Interventions: Diagnostic test evaluation;Hypotension - evaluation and management  Elmer Sow 01/03/2020, 1:52 AM

## 2020-01-03 NOTE — Progress Notes (Signed)
[facility] for Infectious Disease   Reason for visit: Follow up on fever  Interval History: intubated in the ICU; T max decreased at 100.4.  No acute events.  Some minimal pressor support needs with propofol.   Day 4 meropenem Day 5 vancomycin Day 8 total antibiotics   Physical Exam: Constitutional:  Vitals:   01/03/20 0800 01/03/20 0832  BP: (!) 121/54   Pulse: 79   Resp: 20   Temp:    SpO2: 99% 98%   patient is alert HENT: + ET Respiratory: respiratory effort on vent; CTA  Cardiovascular: RRR GI: soft, nt, nd  Review of Systems: Unable to be assessed due to patient factors  Lab Results  Component Value Date   WBC 19.0 (H) 01/03/2020   HGB 8.5 (L) 01/03/2020   HCT 27.7 (L) 01/03/2020   MCV 92.0 01/03/2020   PLT 355 01/03/2020    Lab Results  Component Value Date   CREATININE 2.13 (H) 01/03/2020   BUN 44 (H) 01/03/2020   NA 146 (H) 01/03/2020   K 3.2 (L) 01/03/2020   CL 111 01/03/2020   CO2 27 01/03/2020    Lab Results  Component Value Date   ALT 67 (H) 01/03/2020   AST 67 (H) 01/03/2020   ALKPHOS 56 01/03/2020     Microbiology: Recent Results (from the past 240 hour(s))  SARS Coronavirus 2 by RT PCR (hospital order, performed in [place] hospital lab) Nasopharyngeal Nasopharyngeal Swab     Status: None   Collection Time: 12/27/19  4:44 AM   Specimen: Nasopharyngeal Swab  Result Value Ref Range Status   SARS Coronavirus 2 NEGATIVE NEGATIVE Final    Comment: (NOTE) SARS-CoV-2 target nucleic acids are NOT DETECTED. The SARS-CoV-2 RNA is generally detectable in upper and lower respiratory specimens during the acute phase of infection. The lowest concentration of SARS-CoV-2 viral copies this assay can detect is 250 copies / mL. A negative result does not preclude SARS-CoV-2 infection and should not be used as the sole basis for treatment or other patient management decisions.  A negative result may occur with improper specimen collection /  handling, submission of specimen other than nasopharyngeal swab, presence of viral mutation(s) within the areas targeted by this assay, and inadequate number of viral copies (<250 copies / mL). A negative result must be combined with clinical observations, patient history, and epidemiological information. Fact Sheet for Patients:   StrictlyIdeas.no Fact Sheet for Healthcare Providers: BankingDealers.co.za This test is not yet approved or cleared  by the [place] FDA and has been authorized for detection and/or diagnosis of SARS-CoV-2 by FDA under an Emergency Use Authorization (EUA).  This EUA will remain in effect (meaning this test can be used) for the duration of the COVID-19 declaration under Section 564(b)(1) of the Act, 21 U.S.C. section 360bbb-3(b)(1), unless the authorization is terminated or revoked sooner. Performed at [facility], [place] [address] [place], [place] [address]   Urine culture     Status: None   Collection Time: 12/27/19  4:44 AM   Specimen: Urine, Clean Catch  Result Value Ref Range Status   Specimen Description   Final    URINE, CLEAN CATCH Performed at [facility], [place] [address], [place], [place] [address]    Special Requests   Final    NONE Performed at [facility], [place] [address] [place], [place] [address]    Culture   Final    NO GROWTH  Performed at [facility], [place] [address] [place], [place] [address]    Report Status 12/28/2019 FINAL  Final  Blood culture (routine x 2)     Status: None   Collection Time: 12/27/19  7:01 AM   Specimen: BLOOD  Result Value Ref Range Status   Specimen Description   Final    BLOOD RIGHT ANTECUBITAL Performed at [facility] [address], [place], [place] [address]    Special Requests   Final    BOTTLES DRAWN AEROBIC AND ANAEROBIC Blood Culture adequate volume Performed  at [facility] [address], [place], [place] [address]    Culture   Final    NO GROWTH 5 DAYS Performed at [facility], [place] [address], [place], [place] [address]    Report Status 01/01/2020 FINAL  Final  Blood culture (routine x 2)     Status: None   Collection Time: 12/27/19  7:27 AM   Specimen: BLOOD  Result Value Ref Range Status   Specimen Description   Final    BLOOD LEFT ANTECUBITAL Performed at [facility] [address] [place], [place] [address]    Special Requests   Final    BOTTLES DRAWN AEROBIC AND ANAEROBIC Blood Culture adequate volume Performed at [facility] [address] [place], [place] [address]    Culture   Final    NO GROWTH 5 DAYS Performed at [facility], [place] [address], [place], [place] [address]    Report Status 01/01/2020 FINAL  Final  Culture, blood (routine x 2)     Status: None (Preliminary result)   Collection Time: 12/29/19 10:06 AM   Specimen: BLOOD  Result Value Ref Range Status   Specimen Description   Final    BLOOD RIGHT ARM Performed at [facility] [address], [place], [place] [address]    Special Requests   Final    BOTTLES DRAWN AEROBIC AND ANAEROBIC Blood Culture adequate volume Performed at [facility] [address] [place], [place] [address]    Culture   Final    NO GROWTH 4 DAYS Performed at [facility], [place] [address] [place], [place] [address]    Report Status PENDING  Incomplete  Culture, blood (routine x 2)     Status: None (Preliminary result)   Collection Time: 12/29/19 10:07 AM   Specimen: BLOOD  Result Value Ref Range Status   Specimen Description   Final    BLOOD RIGHT ARM Performed at [facility] [address] [place], [place] [address]    Special Requests   Final    BOTTLES DRAWN AEROBIC AND ANAEROBIC Blood Culture adequate volume Performed at [facility] [address], [place],  [address]    Culture   Final    NO GROWTH 4 DAYS Performed at [facility], [place] [address] [place],  [address]    Report Status PENDING  Incomplete  MRSA PCR Screening     Status: None   Collection Time: 12/30/19  1:14 PM   Specimen: Nasal Mucosa; Nasopharyngeal  Result Value Ref Range Status   MRSA by PCR NEGATIVE NEGATIVE Final    Comment:        The GeneXpert MRSA Assay (FDA approved for NASAL specimens only), is one component of a comprehensive MRSA colonization surveillance program. It is not intended to diagnose MRSA  infection nor to guide or monitor treatment for MRSA infections. Performed at [facility], [place] [address], [place], [place] [address]   Aerobic/Anaerobic Culture (surgical/deep wound)     Status: None (Preliminary result)   Collection Time: 01/01/20  3:14 PM   Specimen: Abscess  Result Value Ref Range Status   Specimen Description   Final    ABSCESS Performed at [facility] [address], [place], [place] [address]    Special Requests   Final    RIGHT RENAL CYST Performed at [facility] [address], [place], [place] [address]    Gram Stain   Final    FEW WBC PRESENT, PREDOMINANTLY PMN NO ORGANISMS SEEN Performed at [facility], [place] [address] [place], [place] [address]    Culture   Final    FEW ESCHERICHIA COLI NO ANAEROBES ISOLATED; CULTURE IN PROGRESS FOR 5 DAYS    Report Status PENDING  Incomplete   Organism ID, Bacteria ESCHERICHIA COLI  Final      Susceptibility   Escherichia coli - MIC*    AMPICILLIN >=32 RESISTANT Resistant     CEFAZOLIN >=64 RESISTANT Resistant     CEFEPIME <=1 SENSITIVE Sensitive     CEFTAZIDIME <=1 SENSITIVE Sensitive     CEFTRIAXONE <=1 SENSITIVE Sensitive     CIPROFLOXACIN <=0.25 SENSITIVE Sensitive     GENTAMICIN <=1 SENSITIVE Sensitive     IMIPENEM <=0.25 SENSITIVE  Sensitive     TRIMETH/SULFA <=20 SENSITIVE Sensitive     AMPICILLIN/SULBACTAM >=32 RESISTANT Resistant     PIP/TAZO 8 SENSITIVE Sensitive     * FEW ESCHERICHIA COLI    Impression/Plan:  1. Fever - trending down.  May be some element of adrenal insufficiency and now has received prednisone.   Will continue to monitor  2.  Fever and leukocytosis - no obvious cause but broad differential.  MRI findings of the pelvis not yet reported but urology does not think there is any abscess.  GNR reported and E coli.  Stopping vancomycin.

## 2020-01-03 NOTE — Procedures (Signed)
Extubation Procedure Note  Patient Details:   Name: Marco [name] DOB: September 13, 1955 MRN: [id number]   Airway Documentation:    Vent end date: (S) 01/03/20 Vent end time: (S) 1040   Evaluation  O2 sats: stable throughout Complications: No apparent complications Patient did tolerate procedure well. Bilateral Breath Sounds: Coarse crackles, Clear, Diminished   Yes  B[place]win [place] Nannette 01/03/2020, 10:50 AM   Positive cuff leak pre extubation. Placed PT on 4 lpm post extubation.

## 2020-01-03 NOTE — Evaluation (Signed)
Clinical/Bedside Swallow Evaluation Patient Details  Name: Marco [name] MRN: [id number] Date of Birth: Feb 26, 1956  Today's Date: 01/03/2020 Time: SLP Start Time (ACUTE ONLY): 28 SLP Stop Time (ACUTE ONLY): 1530 SLP Time Calculation (min) (ACUTE ONLY): 25 min  Past Medical History:  Past Medical History:  Diagnosis Date  . Benign colon polyp 08/01/2013   [facility] in [place]. large base tranverse colon polyp was biopsied. polyp was benign with minimal surface hyperplastic change.  . Chronic combined systolic and diastolic CHF (congestive heart failure) ([place])   . Chronic pain   . CKD (chronic kidney disease), stage III   . Diabetes mellitus without complication ([place])   . Diverticulosis   . Esophageal hiatal hernia 07/29/2013   confirmed on EGD   . Esophageal stricture   . Essential hypertension   . Gastritis 07/29/2013   confirmed on EGD, bx done an negative for intestinal metaplasia, dsyplasia or H. pylori. normal gastric emptying study done 07/13/2013.  [name] GERD (gastroesophageal reflux disease)   . Morbid obesity ([place])   . Non-obstructive CAD    a. 02/2013 Cath ([place]): nonobs dzs;  b. 09/2014 Myoview ([place]): EF 55%, no ischemia;  c. Cath 05/2018 mild CAD no obstruction.  . Persistent atrial fibrillation ([place])    a. 02/2013 s/p rfca in [place]-->prev on Xarelto, d/c'd 2/2 anemia, ? GIB.  [name] Rheumatoid arthritis ([place])   . Sigmoid diverticulosis 08/01/2013   confirmed on colonscopy. record scanned into chart   Past Surgical History:  Past Surgical History:  Procedure Laterality Date  . CARDIAC CATHETERIZATION  05/2014   ablation for atrial fibrillation  . CARDIAC CATHETERIZATION N/A 11/16/2015   Procedure: Left Heart Cath and Coronary Angiography;  Surgeon: Leonie Man, MD;  Location: [facility];  Service: Cardiovascular;  Laterality: N/A;  . COLON RESECTION  09/2013   due to large, abnormal polpy. non cancerous per patient.   . COLON SURGERY  09/2014    colon resection   . LEFT HEART CATH AND CORONARY ANGIOGRAPHY N/A 05/19/2018   Procedure: LEFT HEART CATH AND CORONARY ANGIOGRAPHY;  Surgeon: Wellington Hampshire, MD;  Location: [facility];  Service: Cardiovascular;  Laterality: N/A;   HPI:  64 yo male adm to [facility] with persistent dysuria, weakness, fall, lethargy - concern for sepsis - fever. leukocytosis. Pt with progressive agitation and required intubation 5/23-5/25.  Pt has had renal cyst drained in IR.  PMH also + for obesity, esophageal stricture and hernia, chronic pain.  CXR 5/25 showed moderate trace pleural effusions with likley adjacent ATX.  Swallow evaluation ordered.  Pt admits to h/o reflux and need to take pills wth food or pill will lodge in throat.  Pt states he was scoped and had esophagus dilated many years ago in [place] but denies improvement in swallowing with stretching. He denies weight loss, pnas nor requiriing heimlich manuever prior to admit.   Assessment / Plan / Recommendation Clinical Impression  Patient with functional oropharyngeal swallow based on clinical swallow evaluation. He does have h/o hiatal hernia and esophageal stricture - denies esophageal dilation helping his swallowing and thus he takes his medicine with foods.  No indication of aspiration with all po observed *2 ounces pudding, saltines, apple juice and water.  3 ounce water test easily passed.    Recommend pt start diet *requested wife order soft foods only as pt with only two lower teeth, to which she agreed and provided menu.  Pt has dentures at  home but does not use them.  Recommend soft foods only and start intake with liquids due to xerostomia, mouth breathing and oxygen needs.   Handout of esophageal precautions provided.   All education completed and no follow up needed. SLP Visit Diagnosis: Dysphagia, unspecified (R13.10)    Aspiration Risk  Mild aspiration risk    Diet Recommendation Dysphagia 3 (Mech soft);Regular;Thin liquid(family to  order soft foods)   Liquid Administration via: Cup;Straw Medication Administration: Whole meds with puree Supervision: Patient able to self feed Compensations: Slow rate;Small sips/bites(start intake with liquids d/t xerostomia) Postural Changes: Remain upright for at least 30 minutes after po intake;Seated upright at 90 degrees    Other  Recommendations Oral Care Recommendations: Oral care BID   Follow up Recommendations None      Frequency and Duration     n/a       Prognosis   n/a     Swallow Study   General HPI: 64 yo male adm to [facility] with persistent dysuria, weakness, fall, lethargy - concern for sepsis - fever. leukocytosis. Pt with progressive agitation and required intubation 5/23-5/25.  Pt has had renal cyst drained in IR.  PMH also + for obesity, esophageal stricture and hernia, chronic pain.  CXR 5/25 showed moderate trace pleural effusions with likley adjacent ATX.  Swallow evaluation ordered.  Pt admits to h/o reflux and need to take pills wth food or pill will lodge in throat.  Pt states he was scoped and had esophagus dilated many years ago in [place] but denies improvement in swallowing with stretching. He denies weight loss, pnas nor requiriing heimlich manuever prior to admit. Type of Study: Bedside Swallow Evaluation Previous Swallow Assessment: see HPI Diet Prior to this Study: NPO Temperature Spikes Noted: No Respiratory Status: Nasal cannula History of Recent Intubation: Yes Length of Intubations (days): 3 days Date extubated: 01/03/20 Behavior/Cognition: Alert;Cooperative;Pleasant mood Oral Cavity Assessment: Within Functional Limits Oral Care Completed by SLP: No Oral Cavity - Dentition: Other (Comment);Missing dentition(two lower dentition only) Vision: Functional for self-feeding Self-Feeding Abilities: Able to feed self Patient Positioning: Upright in bed Baseline Vocal Quality: Normal Volitional Cough: Weak(pt states he has never been able to  cough) Volitional Swallow: Able to elicit    Oral/Motor/Sensory Function Overall Oral Motor/Sensory Function: Within functional limits   Ice Chips Ice chips: Not tested   Thin Liquid Thin Liquid: Within functional limits Presentation: Cup;Self Fed;Straw    Nectar Thick Nectar Thick Liquid: Not tested   Honey Thick Honey Thick Liquid: Not tested   Puree Puree: Within functional limits Presentation: Self Fed;Spoon   Solid     Solid: Within functional limits Presentation: Self Fed      Macario Golds 01/03/2020,3:45 PM   Kathleen Lime, MS [place] Office [phone]

## 2020-01-04 ENCOUNTER — Inpatient Hospital Stay (HOSPITAL_COMMUNITY): Payer: Medicaid Other

## 2020-01-04 DIAGNOSIS — R509 Fever, unspecified: Secondary | ICD-10-CM

## 2020-01-04 LAB — BASIC METABOLIC PANEL
Anion gap: 8 (ref 5–15)
BUN: 46 mg/dL — ABNORMAL HIGH (ref 8–23)
CO2: 27 mmol/L (ref 22–32)
Calcium: 7.9 mg/dL — ABNORMAL LOW (ref 8.9–10.3)
Chloride: 108 mmol/L (ref 98–111)
Creatinine, Ser: 2.09 mg/dL — ABNORMAL HIGH (ref 0.61–1.24)
GFR calc Af Amer: 38 mL/min — ABNORMAL LOW (ref >60)
GFR calc non Af Amer: 32 mL/min — ABNORMAL LOW (ref >60)
Glucose, Bld: 116 mg/dL — ABNORMAL HIGH (ref 70–99)
Potassium: 3.7 mmol/L (ref 3.5–5.1)
Sodium: 143 mmol/L (ref 135–145)

## 2020-01-04 LAB — GLUCOSE, CAPILLARY
Glucose-Capillary: 104 mg/dL — ABNORMAL HIGH (ref 70–99)
Glucose-Capillary: 137 mg/dL — ABNORMAL HIGH (ref 70–99)
Glucose-Capillary: 149 mg/dL — ABNORMAL HIGH (ref 70–99)
Glucose-Capillary: 180 mg/dL — ABNORMAL HIGH (ref 70–99)
Glucose-Capillary: 123 mg/dL — ABNORMAL HIGH (ref 70–99)
Glucose-Capillary: 107 mg/dL — ABNORMAL HIGH (ref 70–99)

## 2020-01-04 LAB — CBC
HCT: 26 % — ABNORMAL LOW (ref 39.0–52.0)
Hemoglobin: 8.1 g/dL — ABNORMAL LOW (ref 13.0–17.0)
MCH: 28.6 pg (ref 26.0–34.0)
MCHC: 31.2 g/dL (ref 30.0–36.0)
MCV: 91.9 fL (ref 80.0–100.0)
Platelets: 360 10*3/uL (ref 150–400)
RBC: 2.83 MIL/uL — ABNORMAL LOW (ref 4.22–5.81)
RDW: 16.8 % — ABNORMAL HIGH (ref 11.5–15.5)
WBC: 18.2 10*3/uL — ABNORMAL HIGH (ref 4.0–10.5)
nRBC: 0 % (ref 0.0–0.2)

## 2020-01-04 LAB — PHOSPHORUS: Phosphorus: 2.5 mg/dL (ref 2.5–4.6)

## 2020-01-04 LAB — PROTEIN, TOTAL: Total Protein: 8.6 g/dL — ABNORMAL HIGH (ref 6.5–8.1)

## 2020-01-04 LAB — MAGNESIUM: Magnesium: 2.3 mg/dL (ref 1.7–2.4)

## 2020-01-04 LAB — LACTATE DEHYDROGENASE: LDH: 503 U/L — ABNORMAL HIGH (ref 98–192)

## 2020-01-04 LAB — PROCALCITONIN: Procalcitonin: 2.6 ng/mL

## 2020-01-04 MED ORDER — ENSURE ENLIVE PO LIQD: 237.0000 mL | ORAL | Status: DC

## 2020-01-04 MED ORDER — DULOXETINE HCL 20 MG PO CPEP
20.0000 mg | ORAL_CAPSULE | Freq: Every day | ORAL | Status: DC
  Administered 2020-01-04 – 2020-01-13 (×9): 20 mg via ORAL
  Filled 2020-01-04 (×11): qty 1

## 2020-01-04 MED ORDER — PRO-STAT SUGAR FREE PO LIQD
30.0000 mL | Freq: Two times a day (BID) | ORAL | Status: DC
  Administered 2020-01-04 – 2020-01-08 (×6): 30 mL via ORAL
  Filled 2020-01-04 (×11): qty 30

## 2020-01-04 NOTE — Progress Notes (Signed)
Nutrition Follow-up  DOCUMENTATION CODES:   Obesity unspecified  INTERVENTION:  - will order Ensure Enlive once/day, each supplement provides 350 kcal and 20 grams of protein. - will order 30 mL Prostat BID, each supplement provides 100 kcal and 15 grams of protein. - will order Juven BID, each packet provides 95 calories, 2.5 grams of protein (collagen), and 9.8 grams of carbohydrate (3 grams sugar); also contains 7 grams of L-arginine and L-glutamine, 300 mg vitamin C, 15 mg vitamin E, 1.2 mcg vitamin B-12, 9.5 mg zinc, 200 mg calcium, and 1.5 g  Calcium Beta-hydroxy-Beta-methylbutyrate to support wound healing.   NUTRITION DIAGNOSIS:   Increased nutrient needs related to acute illness, wound healing as evidenced by estimated needs. -revised  GOAL:   Patient will meet greater than or equal to 90% of their needs -unmet at this time.  MONITOR:   PO intake, Supplement acceptance, Labs, Weight trends, Skin  ASSESSMENT:   64 y.o. male with medical history of CHF, non-ischemic cardiomyopathy, stage 3 CKD, type 2 DM, CAD, HTN, obesity, OSA, A. fib s/p ablation in 2017, and psoriatic arthritis on daily prednisone. He presented to the ED with complaints of weakness, lethargy, and multiple falls at home. He was admitted earlier in the month for pyelonephritis. Since discharge he has been experiencing urinary frequency, dysuria, and incontinence. He is being admitted for UTI/prostatitis.  Significant Events: 5/17- admission 5/20- initial RD assessment 5/23- intubation and OGT placement at 1850; drain placement by IR 5/24- TF started 5/25- extubation; diet advanced from NPO to Regular at 1513   Per flow sheet, patient consumed 50% of dinner last night. Estimated nutrition needs updated and supplements ordered as outlined above. Weight today exactly the same as weight on 5/24.   Per notes:  - hemorrhagic/infected renal cyst s/p drain placement - R pleural effusion - acute agitated  delirium/metabolic encephalopathy--resolved - L maxillary sinus disease    Labs reviewed; CBGs: 104 and 137 mg/dl, BUN: 46 mg/dl, creatinine: 2.09 mg/dl, Ca: 7.9 mg/dl, GFR: 38 ml/min. Medications reviewed; 20 mg pepcid/day, sliding scale novolog, 1 tablet multivitamin with minerals/day, 17 g miralax/day, 20 mEq Klor-Con/day, 5 mg deltasone/day.   Diet Order:   Diet Order            Diet regular Room service appropriate? Yes; Fluid consistency: Thin  Diet effective now              EDUCATION NEEDS:   No education needs have been identified at this time  Skin:  Skin Assessment: Skin Integrity Issues: Skin Integrity Issues:: Unstageable Unstageable: full thickness to bilateral buttocks  Last BM:  5/25  Height:   Ht Readings from Last 1 Encounters:  01/01/20 5\' 6"  (1.676 m)    Weight:   Wt Readings from Last 1 Encounters:  01/04/20 101.3 kg    Ideal Body Weight:     BMI:  Body mass index is 36.05 kg/m.  Estimated Nutritional Needs:   Kcal:  2100-2300 kcal  Protein:  105-120 grams  Fluid:  >/= 2 L/day     Jarome Matin, MS, RD, LDN, CNSC Inpatient Clinical Dietitian RD pager # available in [facility]  After hours/weekend pager # available in [facility]

## 2020-01-04 NOTE — Progress Notes (Signed)
[facility] for Infectious Disease   Reason for visit: Follow up on fever  Interval History: s/p extubation, WBC stable at 18, T max 103.  No new complaints.  Day 2 ceftriaxone Day 9 total antibiotics   Physical Exam: Constitutional:  Vitals:   01/04/20 1257 01/04/20 1400  BP:  127/65  Pulse:  78  Resp:  (!) 27  Temp: (!) 100.9 F (38.3 C)   SpO2:  98%   patient is alert, nad Respiratory: some rhonchi but normal respiratory effort Cardiovascular: tachy RR GI: soft, nt, nd  Review of Systems: Constitutional: + fever, no chills GI: no n/d  Lab Results  Component Value Date   WBC 18.2 (H) 01/04/2020   HGB 8.1 (L) 01/04/2020   HCT 26.0 (L) 01/04/2020   MCV 91.9 01/04/2020   PLT 360 01/04/2020    Lab Results  Component Value Date   CREATININE 2.09 (H) 01/04/2020   BUN 46 (H) 01/04/2020   NA 143 01/04/2020   K 3.7 01/04/2020   CL 108 01/04/2020   CO2 27 01/04/2020    Lab Results  Component Value Date   ALT 67 (H) 01/03/2020   AST 67 (H) 01/03/2020   ALKPHOS 56 01/03/2020     Microbiology: Recent Results (from the past 240 hour(s))  SARS Coronavirus 2 by RT PCR (hospital order, performed in [facility] hospital lab) Nasopharyngeal Nasopharyngeal Swab     Status: None   Collection Time: 12/27/19  4:44 AM   Specimen: Nasopharyngeal Swab  Result Value Ref Range Status   SARS Coronavirus 2 NEGATIVE NEGATIVE Final    Comment: (NOTE) SARS-CoV-2 target nucleic acids are NOT DETECTED. The SARS-CoV-2 RNA is generally detectable in upper and lower respiratory specimens during the acute phase of infection. The lowest concentration of SARS-CoV-2 viral copies this assay can detect is 250 copies / mL. A negative result does not preclude SARS-CoV-2 infection and should not be used as the sole basis for treatment or other patient management decisions.  A negative result may occur with improper specimen collection / handling, submission of specimen other than  nasopharyngeal swab, presence of viral mutation(s) within the areas targeted by this assay, and inadequate number of viral copies (<250 copies / mL). A negative result must be combined with clinical observations, patient history, and epidemiological information. Fact Sheet for Patients:   StrictlyIdeas.no Fact Sheet for Healthcare Providers: BankingDealers.co.za This test is not yet approved or cleared  by the [place] FDA and has been authorized for detection and/or diagnosis of SARS-CoV-2 by FDA under an Emergency Use Authorization (EUA).  This EUA will remain in effect (meaning this test can be used) for the duration of the COVID-19 declaration under Section 564(b)(1) of the Act, 21 U.S.C. section 360bbb-3(b)(1), unless the authorization is terminated or revoked sooner. Performed at [facility], [place] [address] [place], [place] [address]   Urine culture     Status: None   Collection Time: 12/27/19  4:44 AM   Specimen: Urine, Clean Catch  Result Value Ref Range Status   Specimen Description   Final    URINE, CLEAN CATCH Performed at [facility], [place] [address], [place], [place] [address]    Special Requests   Final    NONE Performed at [facility], [place] [address], [place], [place] [address]    Culture   Final    NO GROWTH Performed at [facility], [place],  [place] [address]    Report Status 12/28/2019 FINAL  Final  Blood culture (routine x 2)     Status: None   Collection Time: 12/27/19  7:01 AM   Specimen: BLOOD  Result Value Ref Range Status   Specimen Description   Final    BLOOD RIGHT ANTECUBITAL Performed at [facility] [address] [place], [place] [address]    Special Requests   Final    BOTTLES DRAWN AEROBIC AND ANAEROBIC Blood Culture adequate volume Performed at [place]  [address] [place], [place] [address]    Culture   Final    NO GROWTH 5 DAYS Performed at [facility], [place] [address], [place], [place] [address]    Report Status 01/01/2020 FINAL  Final  Blood culture (routine x 2)     Status: None   Collection Time: 12/27/19  7:27 AM   Specimen: BLOOD  Result Value Ref Range Status   Specimen Description   Final    BLOOD LEFT ANTECUBITAL Performed at [facility] [address], [place], [place] [address]    Special Requests   Final    BOTTLES DRAWN AEROBIC AND ANAEROBIC Blood Culture adequate volume Performed at [facility] [address], [place], [place] [address]    Culture   Final    NO GROWTH 5 DAYS Performed at [facility], [place] [address] [place], [place] [address]    Report Status 01/01/2020 FINAL  Final  Culture, blood (routine x 2)     Status: None   Collection Time: 12/29/19 10:06 AM   Specimen: BLOOD  Result Value Ref Range Status   Specimen Description   Final    BLOOD RIGHT ARM Performed at [facility] [address], [place], [place] [address]    Special Requests   Final    BOTTLES DRAWN AEROBIC AND ANAEROBIC Blood Culture adequate volume Performed at [facility] [address], [place], [place] [address]    Culture   Final    NO GROWTH 5 DAYS Performed at [facility], [place] [address] [place], [place] [address]    Report Status 01/03/2020 FINAL  Final  Culture, blood (routine x 2)     Status: None   Collection Time: 12/29/19 10:07 AM   Specimen: BLOOD  Result Value Ref Range Status   Specimen Description   Final    BLOOD RIGHT ARM Performed at [place] [address], [place], [place] [address]    Special Requests   Final    BOTTLES DRAWN AEROBIC AND ANAEROBIC Blood Culture adequate volume Performed at [facility] [address] [place], [place] [address]    Culture    Final    NO GROWTH 5 DAYS Performed at [facility], [place] [address] [place],  [address]    Report Status 01/03/2020 FINAL  Final  MRSA PCR Screening     Status: None   Collection Time: 12/30/19  1:14 PM   Specimen: Nasal Mucosa; Nasopharyngeal  Result Value Ref Range Status   MRSA by PCR NEGATIVE NEGATIVE Final    Comment:        The GeneXpert MRSA Assay (FDA approved for NASAL specimens only), is one component of a comprehensive MRSA colonization surveillance program. It is not intended to diagnose MRSA infection nor to guide or monitor treatment for MRSA infections. Performed at [facility]  [place] [address], [place], [place] [address]   Aerobic/Anaerobic Culture (surgical/deep wound)     Status: None (Preliminary result)   Collection Time: 01/01/20  3:14 PM   Specimen: Abscess  Result Value Ref Range Status   Specimen Description   Final    ABSCESS Performed at [facility] [address] [place], [place] [address]    Special Requests   Final    RIGHT RENAL CYST Performed at [facility] [address] [place], [place] [address]    Gram Stain   Final    FEW WBC PRESENT, PREDOMINANTLY PMN NO ORGANISMS SEEN Performed at [facility], [place] [address], [place], [place] [address]    Culture   Final    FEW ESCHERICHIA COLI NO ANAEROBES ISOLATED; CULTURE IN PROGRESS FOR 5 DAYS    Report Status PENDING  Incomplete   Organism ID, Bacteria ESCHERICHIA COLI  Final      Susceptibility   Escherichia coli - MIC*    AMPICILLIN >=32 RESISTANT Resistant     CEFAZOLIN >=64 RESISTANT Resistant     CEFEPIME <=1 SENSITIVE Sensitive     CEFTAZIDIME <=1 SENSITIVE Sensitive     CEFTRIAXONE <=1 SENSITIVE Sensitive     CIPROFLOXACIN <=0.25 SENSITIVE Sensitive     GENTAMICIN <=1 SENSITIVE Sensitive     IMIPENEM <=0.25 SENSITIVE Sensitive     TRIMETH/SULFA <=20 SENSITIVE Sensitive     AMPICILLIN/SULBACTAM  >=32 RESISTANT Resistant     PIP/TAZO 8 SENSITIVE Sensitive     * FEW ESCHERICHIA COLI  Culture, blood (Routine X 2) w Reflex to ID Panel     Status: None (Preliminary result)   Collection Time: 01/04/20 10:58 AM   Specimen: BLOOD  Result Value Ref Range Status   Specimen Description   Final    BLOOD LEFT ARM Performed at [facility] [address], [place], [place] [address]    Special Requests   Final    BOTTLES DRAWN AEROBIC AND ANAEROBIC Blood Culture adequate volume Performed at [facility] [address], [place], [place] [address]    Culture   Final    NO GROWTH < 12 HOURS Performed at [facility] [address], [place], [place] [address]    Report Status PENDING  Incomplete  Culture, blood (Routine X 2) w Reflex to ID Panel     Status: None (Preliminary result)   Collection Time: 01/04/20 10:58 AM   Specimen: BLOOD  Result Value Ref Range Status   Specimen Description   Final    BLOOD LEFT ARM Performed at [facility] [address] [place], [place] [address]    Special Requests   Final    BOTTLES DRAWN AEROBIC AND ANAEROBIC Blood Culture adequate volume Performed at [facility] [address], [place],  [address]    Culture   Final    NO GROWTH < 12 HOURS Performed at [facility] [address], [place],  [address]    Report Status PENDING  Incomplete    Impression/Plan:  1. Fever - spiked again but overall he is improving so seems to be reactive.  Will continue to monitor.   2.  Fever and leukocytosis - on antibiotic for the renal cyst and culture noted.  Have changed to ceftriaxone based on sensitivities.   Will continue to monitor.

## 2020-01-04 NOTE — Progress Notes (Signed)
OT Cancellation Note  Patient Details Name: Marco [name] MRN: [id number] DOB: 1955/08/22   Cancelled Treatment:    Reason Eval/Treat Not Completed: Medical issues which prohibited therapy. Spoke with RN states patient was exhausted after bed bath earlier this morning and is going for CT soon. Will re-attempt as schedule allows.  Delbert Phenix OT Pager: [phone] 01/04/2020, 12:35 PM

## 2020-01-04 NOTE — Progress Notes (Addendum)
NAME:  Isam Zanger, MRN:  [id number], DOB:  [birth year], LOS: 8 ADMISSION DATE:  12/27/2019, CONSULTATION DATE:  5/23 REFERRING MD:  Dr. Elbert Ewings, CHIEF COMPLAINT:  Weakness, lethargy   Brief History   64 y/o M who presented to [facility] on 5/18 with reports of weakness, lethargy and falls. Recent admit from 4/30-5/4 with severe sepsis due to MDR E-Coli right pyelonephritis.  Initially admitted with concern for sepsis with fever, leukocytosis and elevated lactic acid. Work up concerning for possible infected right renal cyst with associated right renal pyelonephritis.  He had progressive agitated delirium, fevers despite antibiotics.  IR drained renal cyst 5/23 with bloody fluid returned.  Ultimately, required intubation on 5/23 for agitated delirium / impending respiratory failure.  Renal cyst fluid grew out E-Coli.    Past Medical History  RA  Morbid Obesity  HTN Chronic combined systolic & diastolic CHF Non-obstructive CAD  Persistent AF CKD III  DM  GERD Esophageal stricture Esophageal hernia  Chronic Pain   Significant Hospital Events   5/18 Admit  5/20 Urology consulted for right renal cyst, concern for source of sepsis  5/21 ID consulted. LP recommended but could not be done.  5/22 CCM consulted  5/23 IR consult, R renal cyst drained with 30 ml of bloody fluid removed.  Intubated for agitated delirium.   Consults:  Urology  ID  CCM  IR   Procedures:  ETT 5/23 >> 5/25  Significant Diagnostic Tests:   CT Head 5/18 >> no acute intracranial findings or mass lesions, stable severe chronic left maxillary and left half sphenoid sinus disease   US Pelvis 5/18 >> normal exam   CT ABD/Pelvis w/o 5/19 >> small bilateral pleural effusions with mild bibasilar atelectasis, stable 3.9 x 2.9 cm exophytic isodense area along the posterior aspect of the upper pole of the right kidney with moderate to marked amount of surrounding inflammatory fat stranding.  This likely represents a  hemorrhagic cyst with associated right sided pyelonephritis.  Stable left renal cyst.  Bilateral non-obstructing renal stones. Colonic diverticulosis. Bilateral fat containing inguinal hernias.   MRI Brain w/o 5/21 >> no significant intracranial finding, no acute infarction or other acute process. Sinus disease primarily affecting the left maxillary and sphenoid sinus.  The sinuses could contain chronic inspissated mucus or could be fungal sinusitis   ECHO 5/22 >> mild hypokinesis of the distal septum & apex. LVEF 50-55%, LV has low normal function.  LV demonstrates regional wall motion abnormalities.  Mild concentric left ventricular hypertrophy.  Grade I diastolic dysfunction. RV systolic function normal.  RA mildly dilated.  EEG 5/24 >> severe diffuse encephalopathy, nos-specific. No seizure or epileptiform discharges   MRI Pelvis 5/24 >> no acute findings within the pelvis, trache amount free fluid in the posterior pelvis, bilateral fat containing inguinal hernias  Micro Data:  COVID 5/18 >> negative  UA 5/18 >> trace leukocytes, 30 protein, rare bacteria, 0-5 WBC UC 5/18 >> negative  MRSA PCR 5/21 >> negative  BCx2 5/18 >> negative  R Renal Cyst Abscess 5/23 >> E-Coli >> S-imipenem, R-ampicillin, cefazolin, unasyn BCx2 5/20 >> negative  Crypto Antigen 5/21 >> negative  BCx2 5/26 >>   Antimicrobials:  Ceftriaxone 5/18 >> 5/20  Vanco 5/20 >> 5/25 Meropenem 5/24 >>   Interim history/subjective:  Tmax 103 / WBC 18.2  > treated with ice packs, medication overnight Wore BiPAP overnight  Glucose range 104 - 137  I/O 775 UOP, 15 ml from drain, net event for 24 hours  Pt reports feeling poorly, has shaking chills   Objective   Blood pressure 123/60, pulse 87, temperature (!) 100.4 F (38 C), temperature source Oral, resp. rate (!) 22, height 5\' 6"  (1.676 m), weight 101.3 kg, SpO2 96 %.    Vent Mode: BIPAP FiO2 (%):  [30 %] 30 % Set Rate:  [10 bmp] 10 bmp PEEP:  [5 cmH20] 5 cmH20     Intake/Output Summary (Last 24 hours) at 01/04/2020 0943 Last data filed at 01/04/2020 [id number] Gross per 24 hour  Intake 580 ml  Output 790 ml  Net -210 ml   Filed Weights   12/27/19 1254 01/02/20 1505 01/04/20 0500  Weight: 108.3 kg 101.3 kg 101.3 kg    Examination: General: elderly male lying in bed, appears uncomfortable   HEENT: MM pink/moist, anicteric, poor dentition Neuro: AAOx4, speech clear, MAE  CV: s1s2 RRR, no m/r/g PULM: non-labored, clear anterior, diminished right base  GI: soft, bsx4 active  Extremities: warm/dry, trace LE edema  Skin: no rashes or lesions  Resolved Hospital Problem list   Acute Agitated Delirium / Metabolic Encephalopathy   Assessment & Plan:   Sepsis in setting of Right Pyelonephritis with Infected Cyst / E-Coli Isolated Persistent Fever / Leukocytosis  Hx of E-Coli UTI. Concern for infected right renal cyst with stranding. Urology assessing left testicle given heterogeneity on [place] but no abscess  -appreciated ID input  -continue cefepime  -will repeat blood cultures, assess CT chest given persistent fevers  -assess PCT -appreciate Urology assistance with patient care  Acute Respiratory Failure with Hypoxemia in setting of Sepsis, Possible ALI Right Pleural Effusion  OSA  Concern for component pulmonary edema with elevated BNP, effusions. [place] assessment 5/24 with small effusion, not enough for thoracentesis.  -wean O2 for sats >90% -pulmonary hygiene- IS, mobilize -assess CT chest as above to review for indication of fevers  Left Maxillary Sinus Disease Chronic disease with mucoid impaction vs fungal infection on imaging per radiology. ID does not feel this is fungal infection.  -per ID -follow cultures  CKD IIIa Hypokalemia  -renal dose medications  -trend BMP / UOP  -monitor, replace electrolytes as indicated -avoid nephrotoxic agents   Chronic Combined Systolic / Diastolic CHF without Exacerbation  Troponin Leak Persistent  AF -on coreg, no anticoagulation prior to admit  Cardiac cat in 2019 with non-ischemic systolic CHF, LVEF [id number]. 10/2019 with normal systolic function. Suspect troponin leak demand ischemia.  -continue ASA, lipitor  -hold home coreg, entresto -avoid QTC prolonging agents  -prior Cardiology notes > discussion regarding Xarelto, previously stopped due to anemia but there was consideration for restarting (5/721) per [name]  Rheumatoid Arthritis  On prednisone, cosentyx, oxycodone at baseline  -continue prednisone, lyrica -resume cymbalta  Anemia of Critical Illness  -trend CBC  Mild Elevation of LFT's  Suspect in setting of hypotension -follow trend   Gout  -hold allopurinol with AKI    At Risk Malnutrition  -advance diet as tolerated  Best practice:  Diet: TF Pain/Anxiety/Delirium protocol (if indicated): n/a  VAP protocol (if indicated): n/a DVT prophylaxis: heparin GI prophylaxis: pepcid  Glucose control: SSI  Mobility: BR  Code Status: Full Code  Family Communication:  Wife Forensic scientist) called for update 5/26.  Disposition: SDU.  To [facility] as of 5/27.   Labs    CBC: Recent Labs  Lab 12/30/19 0512 12/30/19 0512 12/31/19 0230 12/31/19 1203 01/01/20 0750 01/01/20 1845 01/02/20 0521 01/03/20 0444 01/04/20 0747  WBC 16.8*   < > 15.8*   < >  14.0* 12.2* 16.4* 19.0* 18.2*  NEUTROABS 12.7*  --  11.7*  --  10.0*  --  11.0* 14.5*  --   HGB 9.7*   < > 9.4*   < > 9.3* 10.5* 10.2* 8.5* 8.1*  HCT 30.1*   < > 30.3*   < > 30.1* 33.0* 32.4* 27.7* 26.0*  MCV 90.4   < > 92.9   < > 93.2 92.4 92.8 92.0 91.9  PLT 267   < > 278   < > 316 356 378 355 360   < > = values in this interval not displayed.    Basic Metabolic Panel: Recent Labs  Lab 12/31/19 1203 12/31/19 1203 01/01/20 0750 01/01/20 0750 01/01/20 1845 01/01/20 2045 01/02/20 0521 01/02/20 1325 01/02/20 1649 01/03/20 0444 01/03/20 1348 01/04/20 0747  NA 143   < > 142  --  145  --  148*  --   --  146*  --  143   K 4.0   < > 4.2   < > 4.4  --  3.6  --   --  3.2* 3.6 3.7  CL 109   < > 108  --  107  --  108  --   --  111  --  108  CO2 25   < > 22  --  25  --  28  --   --  27  --  27  GLUCOSE 120*   < > 117*  --  147*  --  135*  --   --  186*  --  116*  BUN 22   < > 25*  --  29*  --  33*  --   --  44*  --  46*  CREATININE 1.73*   < > 1.63*  --  1.68*  --  2.09*  --   --  2.13*  --  2.09*  CALCIUM 7.7*   < > 8.0*  --  8.2*  --  8.1*  --   --  7.8*  --  7.9*  MG 2.1   < >  --   --   --  2.0  --  2.0 2.1 2.2  --  2.3  PHOS 2.8  --   --   --   --   --   --  1.9* 1.9* 2.4*  --  2.5   < > = values in this interval not displayed.   GFR: Estimated Creatinine Clearance: 39.8 mL/min (A) (by C-G formula based on SCr of 2.09 mg/dL (H)). Recent Labs  Lab 12/31/19 1203 12/31/19 1203 01/01/20 0750 01/01/20 0750 01/01/20 1845 01/01/20 1846 01/01/20 2045 01/02/20 0521 01/03/20 0444 01/03/20 1348 01/04/20 0747  PROCALCITON 0.30  --  0.35  --   --   --   --  5.11  --   --   --   WBC 15.0*   < > 14.0*   < > 12.2*  --   --  16.4* 19.0*  --  18.2*  LATICACIDVEN 1.0  --   --   --   --  2.3* 2.7*  --   --  2.4*  --    < > = values in this interval not displayed.    Liver Function Tests: Recent Labs  Lab 12/31/19 1203 01/01/20 0750 01/02/20 0521 01/03/20 0444  AST 16 17 37 67*  ALT 17 17 30  67*  ALKPHOS 50 49 58 56  BILITOT 1.0 1.4* 1.2 1.0  PROT  6.7 6.8 7.3 6.6  ALBUMIN 2.2* 2.2* 2.6* 2.2*   No results for input(s): LIPASE, AMYLASE in the last 168 hours. No results for input(s): AMMONIA in the last 168 hours.  ABG    Component Value Date/Time   PHART 7.453 (H) 01/02/2020 0355   PCO2ART 36.9 01/02/2020 0355   PO2ART 110 (H) 01/02/2020 0355   HCO3 24.6 01/02/2020 0355   TCO2 25 05/19/2018 1430   ACIDBASEDEF 3.3 (H) 12/31/2019 0908   O2SAT 97.1 01/02/2020 0355     Coagulation Profile: Recent Labs  Lab 12/30/19 1106  INR 1.2    Cardiac Enzymes: No results for input(s): CKTOTAL, CKMB,  CKMBINDEX, TROPONINI in the last 168 hours.  HbA1C: HbA1c, POC (controlled diabetic range)  Date/Time Value Ref Range Status  05/02/2019 01:55 PM 7.2 (A) 0.0 - 7.0 % Final  01/29/2018 03:38 PM 10.2 (A) 0.0 - 7.0 % Final   Hgb A1c MFr Bld  Date/Time Value Ref Range Status  12/08/2019 08:00 PM 8.4 (H) 4.8 - 5.6 % Final    Comment:    (NOTE) Pre diabetes:          5.7%-6.4% Diabetes:              >6.4% Glycemic control for   <7.0% adults with diabetes   09/26/2019 11:16 AM 9.2 (H) 4.8 - 5.6 % Final    Comment:    (NOTE) Pre diabetes:          5.7%-6.4% Diabetes:              >6.4% Glycemic control for   <7.0% adults with diabetes     CBG: Recent Labs  Lab 01/03/20 1718 01/03/20 2023 01/03/20 2330 01/04/20 0322 01/04/20 0814  GLUCAP 209* 106* 98 104* 137*     Critical care time:       Noe Gens, MSN, NP-C [facility] 01/04/2020, 9:43 AM   Please see [email or web address] for pager details.

## 2020-01-04 NOTE — Progress Notes (Signed)
Pt adamantly refuses to wear bipap tonight.  Pt explained the benefits/importance of it, but he still refused.  Bipap remains in room on standby.

## 2020-01-04 NOTE — Progress Notes (Signed)
PT Cancellation Note  Patient Details Name: Marco [name] MRN: [id number] DOB: 11-13-1955   Cancelled Treatment:    Reason Eval/Treat Not Completed: Medical issues which prohibited therapy RN reports pt feeling poorly this morning and has fever.  Pt assisted with bathing earlier and reports feeling tired and fatigued to RN. Pt about to go for CT as well.  Will hold therapy today and check back as schedule permits.   Kennis Buell,KATHrine E 01/04/2020, 11:45 AM Arlyce Dice, DPT Acute Rehabilitation Services Office: [phone]

## 2020-01-04 NOTE — Progress Notes (Addendum)
Assessment:  1. Possibly infected right renal cyst:  Although is right renal cyst showed inflammatory changes when compared with previous imaging studies this could be due to an infectious etiology but also if he there was bleeding into the cyst.  When it was drained that revealed before consistent with hemorrhage into the cyst.  Culture demonstrated low counts of E Coli, same prior resistance pattern. He is on broad-spectrum antibiotics (Rocephin).  The drain output is minimal as to be expected.  2. Fever and leukocytosis:  MRI and Scrotal US without source. No concern for prostate abscess, no concern for infectious scrotal process. Patient confirms testicular pain is stable and at his baseline (~2-3 years). May be related to right renal cyst but would have expected positive blood cultures as well as improvement after source control. Would encourage continued workup given clinical course is not consistent with pyelonephritis and infected renal cyst.   Plan:  Continue supportive care Continue IR drain, record outputs Agree with continued broad-spectrum antibiotics, include E Coli coverage based on cultures  Subjective: Now extubated.  Persistent fevers; Tmax 103 No tachycardia or hypotension.  Confirms stable testicular pain over the past 2-3 years.  Confirms has been on chronic steroids.   Objective: Vital signs in last 24 hours: Temp:  [98 F (36.7 C)-103 F (39.4 C)] 100.4 F (38 C) (05/26 0850) Pulse Rate:  [60-94] 87 (05/26 0700) Resp:  [13-32] 22 (05/26 0700) BP: (106-177)/(57-103) 123/60 (05/26 0600) SpO2:  [91 %-98 %] 96 % (05/26 0700) FiO2 (%):  [30 %] 30 % (05/26 0323) Weight:  [101.3 kg] 101.3 kg (05/26 0500)A  Intake/Output from previous day: 05/25 0701 - 05/26 0700 In: 986.4 [P.O.:480; I.V.:23.4; NG/GT:382.9; IV Piggyback:100] Out: 990 [Urine:775; Drains:15; Stool:200] Intake/Output this shift: No intake/output data recorded.  Past Medical History:  Diagnosis  Date   Benign colon polyp 08/01/2013   [facility] [place] in [place]. large base tranverse colon polyp was biopsied. polyp was benign with minimal surface hyperplastic change.   Chronic combined systolic and diastolic CHF (congestive heart failure) (HCC)    Chronic pain    CKD (chronic kidney disease), stage III    Diabetes mellitus without complication ([place])    Diverticulosis    Esophageal hiatal hernia 07/29/2013   confirmed on EGD    Esophageal stricture    Essential hypertension    Gastritis 07/29/2013   confirmed on EGD, bx done an negative for intestinal metaplasia, dsyplasia or H. pylori. normal gastric emptying study done 07/13/2013.   GERD (gastroesophageal reflux disease)    Morbid obesity ([place])    Non-obstructive CAD    a. 02/2013 Cath ([place]): nonobs dzs;  b. 09/2014 Myoview ([place]): EF 55%, no ischemia;  c. Cath 05/2018 mild CAD no obstruction.   Persistent atrial fibrillation ([place])    a. 02/2013 s/p rfca in [place], [place]-->prev on Xarelto, d/c'd 2/2 anemia, ? GIB.   Rheumatoid arthritis ([place])    Sigmoid diverticulosis 08/01/2013   confirmed on colonscopy. record scanned into chart    Physical Exam:  GU:  Condom catheter on phallus draining clear urine.  Scrotum normal without fluctuance, crepitus or erythema.  Testicles palpably normal with tenderness to palpation of the left testicle (verbally confirmed this has been the same for several years).  No fluctuance noted.  Lab Results: Recent Labs    01/02/20 0521 01/03/20 0444 01/04/20 0747  WBC 16.4* 19.0* 18.2*  HGB 10.2* 8.5* 8.1*  HCT 32.4* 27.7* 26.0*   BMET  Recent Labs    01/03/20 0444 01/03/20 0444 01/03/20 1348 01/04/20 0747  NA 146*  --   --  143  K 3.2*   < > 3.6 3.7  CL 111  --   --  108  CO2 27  --   --  27  GLUCOSE 186*  --   --  116*  BUN 44*  --   --  46*  CREATININE 2.13*  --   --  2.09*  CALCIUM 7.8*  --   --  7.9*   < > = values in this interval not displayed.   No results for  input(s): LABURIN in the last 72 hours. Results for orders placed or performed during the hospital encounter of 12/27/19  SARS Coronavirus 2 by RT PCR (hospital order, performed in [facility] hospital lab) Nasopharyngeal Nasopharyngeal Swab     Status: None   Collection Time: 12/27/19  4:44 AM   Specimen: Nasopharyngeal Swab  Result Value Ref Range Status   SARS Coronavirus 2 NEGATIVE NEGATIVE Final    Comment: (NOTE) SARS-CoV-2 target nucleic acids are NOT DETECTED. The SARS-CoV-2 RNA is generally detectable in upper and lower respiratory specimens during the acute phase of infection. The lowest concentration of SARS-CoV-2 viral copies this assay can detect is 250 copies / mL. A negative result does not preclude SARS-CoV-2 infection and should not be used as the sole basis for treatment or other patient management decisions.  A negative result may occur with improper specimen collection / handling, submission of specimen other than nasopharyngeal swab, presence of viral mutation(s) within the areas targeted by this assay, and inadequate number of viral copies (<250 copies / mL). A negative result must be combined with clinical observations, patient history, and epidemiological information. Fact Sheet for Patients:   StrictlyIdeas.no Fact Sheet for Healthcare Providers: BankingDealers.co.za This test is not yet approved or cleared  by the [place] FDA and has been authorized for detection and/or diagnosis of SARS-CoV-2 by FDA under an Emergency Use Authorization (EUA).  This EUA will remain in effect (meaning this test can be used) for the duration of the COVID-19 declaration under Section 564(b)(1) of the Act, 21 U.S.C. section 360bbb-3(b)(1), unless the authorization is terminated or revoked sooner. Performed at [facility], [place] [address] [place], [place] [address]   Urine culture     Status: None    Collection Time: 12/27/19  4:44 AM   Specimen: Urine, Clean Catch  Result Value Ref Range Status   Specimen Description   Final    URINE, CLEAN CATCH Performed at [facility], [place] [address], [place], [place] [address]    Special Requests   Final    NONE Performed at [facility], [place] [address], [place], [place] [address]    Culture   Final    NO GROWTH Performed at [facility], [place] [address] [place], [place] [address]    Report Status 12/28/2019 FINAL  Final  Blood culture (routine x 2)     Status: None   Collection Time: 12/27/19  7:01 AM   Specimen: BLOOD  Result Value Ref Range Status   Specimen Description   Final    BLOOD RIGHT ANTECUBITAL Performed at [place] [address], [place], [place] [address]    Special Requests   Final    BOTTLES DRAWN AEROBIC AND ANAEROBIC Blood Culture adequate volume Performed at [facility] [place]  [address] [place], [place] [address]    Culture   Final    NO GROWTH 5 DAYS Performed at [facility], [place] [address] [place], [place] [address]    Report Status 01/01/2020 FINAL  Final  Blood culture (routine x 2)     Status: None   Collection Time: 12/27/19  7:27 AM   Specimen: BLOOD  Result Value Ref Range Status   Specimen Description   Final    BLOOD LEFT ANTECUBITAL Performed at [place] [address] [place] [address] [place], [place] [address]    Special Requests   Final    BOTTLES DRAWN AEROBIC AND ANAEROBIC Blood Culture adequate volume Performed at [place] [address], [place], [place] [address]    Culture   Final    NO GROWTH 5 DAYS Performed at [facility], [place] [address], [place], [place] [address]    Report Status 01/01/2020 FINAL  Final  Culture, blood (routine x 2)     Status: None   Collection Time: 12/29/19 10:06 AM   Specimen: BLOOD  Result Value Ref Range Status    Specimen Description   Final    BLOOD RIGHT ARM Performed at [place] [address], [place], [place] [address]    Special Requests   Final    BOTTLES DRAWN AEROBIC AND ANAEROBIC Blood Culture adequate volume Performed at [place] [address], [place], [place] [address]    Culture   Final    NO GROWTH 5 DAYS Performed at [facility], [place] [address], [place], [place] [address]    Report Status 01/03/2020 FINAL  Final  Culture, blood (routine x 2)     Status: None   Collection Time: 12/29/19 10:07 AM   Specimen: BLOOD  Result Value Ref Range Status   Specimen Description   Final    BLOOD RIGHT ARM Performed at [place] [address] [place] [address], [place], [place] [address]    Special Requests   Final    BOTTLES DRAWN AEROBIC AND ANAEROBIC Blood Culture adequate volume Performed at [place] [address], [place], [place] [address]    Culture   Final    NO GROWTH 5 DAYS Performed at [facility], [place] [address] [place], [place] [address]    Report Status 01/03/2020 FINAL  Final  MRSA PCR Screening     Status: None   Collection Time: 12/30/19  1:14 PM   Specimen: Nasal Mucosa; Nasopharyngeal  Result Value Ref Range Status   MRSA by PCR NEGATIVE NEGATIVE Final    Comment:        The GeneXpert MRSA Assay (FDA approved for NASAL specimens only), is one component of a comprehensive MRSA colonization surveillance program. It is not intended to diagnose MRSA infection nor to guide or monitor treatment for MRSA infections. Performed at [facility], [place] [address], [place], [place] [address]   Aerobic/Anaerobic Culture (surgical/deep wound)     Status: None (Preliminary result)   Collection Time: 01/01/20  3:14 PM   Specimen: Abscess  Result Value Ref Range Status   Specimen Description   Final    ABSCESS Performed at [place] [address] [place], [place] [address]    Special Requests   Final    RIGHT RENAL CYST Performed at [place] [address], [place],  [address]  Gram Stain   Final    FEW WBC PRESENT, PREDOMINANTLY PMN NO ORGANISMS SEEN Performed at [facility], [place] [address] [place], [place] [address]    Culture   Final    FEW ESCHERICHIA COLI NO ANAEROBES ISOLATED; CULTURE IN PROGRESS FOR 5 DAYS    Report Status PENDING  Incomplete   Organism ID, Bacteria ESCHERICHIA COLI  Final      Susceptibility   Escherichia coli - MIC*    AMPICILLIN >=32 RESISTANT Resistant     CEFAZOLIN >=64 RESISTANT Resistant     CEFEPIME <=1 SENSITIVE Sensitive     CEFTAZIDIME <=1 SENSITIVE Sensitive     CEFTRIAXONE <=1 SENSITIVE Sensitive     CIPROFLOXACIN <=0.25 SENSITIVE Sensitive     GENTAMICIN <=1 SENSITIVE Sensitive     IMIPENEM <=0.25 SENSITIVE Sensitive     TRIMETH/SULFA <=20 SENSITIVE Sensitive     AMPICILLIN/SULBACTAM >=32 RESISTANT Resistant     PIP/TAZO 8 SENSITIVE Sensitive     * FEW ESCHERICHIA COLI    Studies/Results: DG CHEST PORT 1 VIEW  Result Date: 01/04/2020 CLINICAL DATA:  Respiratory failure. EXAM: PORTABLE CHEST 1 VIEW COMPARISON:  Radiographs 01/03/2020 and 01/02/2020. FINDINGS: 0450 hours. Interval extubation and removal of the enteric tube. Stable cardiomegaly, right pleural effusion and probable bibasilar atelectasis. The pulmonary vascularity appears improved without definite residual edema. There is no pneumothorax. The bones appear unchanged. IMPRESSION: Improved pulmonary vascular congestion following extubation. Stable cardiomegaly, right pleural effusion and probable bibasilar atelectasis. Electronically Signed   By: Richardean Sale M.D.   On: 01/04/2020 09:06

## 2020-01-05 ENCOUNTER — Inpatient Hospital Stay (HOSPITAL_COMMUNITY): Payer: Medicaid Other

## 2020-01-05 DIAGNOSIS — J90 Pleural effusion, not elsewhere classified: Secondary | ICD-10-CM

## 2020-01-05 DIAGNOSIS — I10 Essential (primary) hypertension: Secondary | ICD-10-CM

## 2020-01-05 DIAGNOSIS — L4050 Arthropathic psoriasis, unspecified: Secondary | ICD-10-CM

## 2020-01-05 DIAGNOSIS — I480 Paroxysmal atrial fibrillation: Secondary | ICD-10-CM

## 2020-01-05 DIAGNOSIS — N1831 Chronic kidney disease, stage 3a: Secondary | ICD-10-CM

## 2020-01-05 LAB — COMPREHENSIVE METABOLIC PANEL
ALT: 143 U/L — ABNORMAL HIGH (ref 0–44)
AST: 146 U/L — ABNORMAL HIGH (ref 15–41)
Albumin: 2.2 g/dL — ABNORMAL LOW (ref 3.5–5.0)
Alkaline Phosphatase: 76 U/L (ref 38–126)
Anion gap: 15 (ref 5–15)
BUN: 34 mg/dL — ABNORMAL HIGH (ref 8–23)
CO2: 23 mmol/L (ref 22–32)
Calcium: 8 mg/dL — ABNORMAL LOW (ref 8.9–10.3)
Chloride: 106 mmol/L (ref 98–111)
Creatinine, Ser: 1.65 mg/dL — ABNORMAL HIGH (ref 0.61–1.24)
GFR calc Af Amer: 50 mL/min — ABNORMAL LOW (ref >60)
GFR calc non Af Amer: 43 mL/min — ABNORMAL LOW (ref >60)
Glucose, Bld: 110 mg/dL — ABNORMAL HIGH (ref 70–99)
Potassium: 3.9 mmol/L (ref 3.5–5.1)
Sodium: 144 mmol/L (ref 135–145)
Total Bilirubin: 0.8 mg/dL (ref 0.3–1.2)
Total Protein: 7 g/dL (ref 6.5–8.1)

## 2020-01-05 LAB — GLUCOSE, PLEURAL OR PERITONEAL FLUID: Glucose, Fluid: 22 mg/dL

## 2020-01-05 LAB — BODY FLUID CELL COUNT WITH DIFFERENTIAL
Monocyte-Macrophage-Serous Fluid: 6 % — ABNORMAL LOW (ref 50–90)
Neutrophil Count, Fluid: 94 % — ABNORMAL HIGH (ref 0–25)
Total Nucleated Cell Count, Fluid: 4255 cu mm — ABNORMAL HIGH (ref 0–1000)

## 2020-01-05 LAB — HEPATITIS PANEL, ACUTE
HCV Ab: NONREACTIVE
Hep A IgM: NONREACTIVE
Hep B C IgM: NONREACTIVE
Hepatitis B Surface Ag: NONREACTIVE

## 2020-01-05 LAB — CBC
HCT: 29.6 % — ABNORMAL LOW (ref 39.0–52.0)
Hemoglobin: 8.9 g/dL — ABNORMAL LOW (ref 13.0–17.0)
MCH: 28.2 pg (ref 26.0–34.0)
MCHC: 30.1 g/dL (ref 30.0–36.0)
MCV: 93.7 fL (ref 80.0–100.0)
Platelets: 342 10*3/uL (ref 150–400)
RBC: 3.16 MIL/uL — ABNORMAL LOW (ref 4.22–5.81)
RDW: 17 % — ABNORMAL HIGH (ref 11.5–15.5)
WBC: 16.3 10*3/uL — ABNORMAL HIGH (ref 4.0–10.5)
nRBC: 0 % (ref 0.0–0.2)

## 2020-01-05 LAB — PROCALCITONIN: Procalcitonin: 1.47 ng/mL

## 2020-01-05 LAB — PROTEIN, PLEURAL OR PERITONEAL FLUID: Total protein, fluid: 4.2 g/dL

## 2020-01-05 LAB — LACTATE DEHYDROGENASE, PLEURAL OR PERITONEAL FLUID: LD, Fluid: 1818 U/L — ABNORMAL HIGH (ref 3–23)

## 2020-01-05 LAB — GLUCOSE, CAPILLARY
Glucose-Capillary: 94 mg/dL (ref 70–99)
Glucose-Capillary: 101 mg/dL — ABNORMAL HIGH (ref 70–99)
Glucose-Capillary: 157 mg/dL — ABNORMAL HIGH (ref 70–99)
Glucose-Capillary: 140 mg/dL — ABNORMAL HIGH (ref 70–99)
Glucose-Capillary: 191 mg/dL — ABNORMAL HIGH (ref 70–99)
Glucose-Capillary: 148 mg/dL — ABNORMAL HIGH (ref 70–99)

## 2020-01-05 MED ORDER — PREGABALIN 100 MG PO CAPS
100.0000 mg | ORAL_CAPSULE | Freq: Two times a day (BID) | ORAL | Status: DC
  Administered 2020-01-05 – 2020-01-14 (×18): 100 mg via ORAL
  Filled 2020-01-05 (×17): qty 1

## 2020-01-05 MED ORDER — LIDOCAINE-EPINEPHRINE (PF) 2 %-1:200000 IJ SOLN
INTRAMUSCULAR | Status: AC
  Filled 2020-01-05: qty 20

## 2020-01-05 MED ORDER — LIDOCAINE HCL 1 % IJ SOLN
INTRAMUSCULAR | Status: AC
  Filled 2020-01-05: qty 20

## 2020-01-05 MED ORDER — LABETALOL HCL 5 MG/ML IV SOLN
10.0000 mg | Freq: Once | INTRAVENOUS | Status: AC
  Administered 2020-01-05: 10 mg via INTRAVENOUS
  Filled 2020-01-05: qty 4

## 2020-01-05 MED ORDER — CARVEDILOL 12.5 MG PO TABS
12.5000 mg | ORAL_TABLET | Freq: Two times a day (BID) | ORAL | Status: DC
  Administered 2020-01-05 – 2020-01-14 (×17): 12.5 mg via ORAL
  Filled 2020-01-05 (×17): qty 1

## 2020-01-05 MED ORDER — ORAL CARE MOUTH RINSE
15.0000 mL | Freq: Two times a day (BID) | OROMUCOSAL | Status: DC
  Administered 2020-01-05 – 2020-01-14 (×13): 15 mL via OROMUCOSAL

## 2020-01-05 NOTE — Progress Notes (Addendum)
NAME:  Marco [name], MRN:  [id number], DOB:  05-Jun-1956, LOS: 9 ADMISSION DATE:  12/27/2019, CONSULTATION DATE:  5/23 REFERRING MD:  Dr. Elbert Ewings, CHIEF COMPLAINT:  Weakness, lethargy   Brief History   64 y/o M who presented to [facility] on 5/18 with reports of weakness, lethargy and falls. Recent admit from 4/30-5/4 with severe sepsis due to MDR E-Coli right pyelonephritis.  Initially admitted with concern for sepsis with fever, leukocytosis and elevated lactic acid. Work up concerning for possible infected right renal cyst with associated right renal pyelonephritis.  He had progressive agitated delirium, fevers despite antibiotics.  IR drained renal cyst 5/23 with bloody fluid returned.  Ultimately, required intubation on 5/23 for agitated delirium / impending respiratory failure.  Renal cyst fluid grew out E-Coli.    Past Medical History  RA  Morbid Obesity  HTN Chronic combined systolic & diastolic CHF Non-obstructive CAD  Persistent AF CKD III  DM  GERD Esophageal stricture Esophageal hernia  Chronic Pain   Significant Hospital Events   5/18 Admit  5/20 Urology consulted for right renal cyst, concern for source of sepsis  5/21 ID consulted. LP recommended but could not be done.  5/22 CCM consulted  5/23 IR consult, R renal cyst drained with 30 ml of bloody fluid removed.  Intubated for agitated delirium.  5/25 Extubated 5/27 For thoracentesis per IR (partially loculated)  Consults:  Urology  ID  CCM  IR   Procedures:  ETT 5/23 >> 5/25  Significant Diagnostic Tests:   CT Head 5/18 >> no acute intracranial findings or mass lesions, stable severe chronic left maxillary and left half sphenoid sinus disease   US Pelvis 5/18 >> normal exam   CT ABD/Pelvis w/o 5/19 >> small bilateral pleural effusions with mild bibasilar atelectasis, stable 3.9 x 2.9 cm exophytic isodense area along the posterior aspect of the upper pole of the right kidney with moderate to marked amount of  surrounding inflammatory fat stranding.  This likely represents a hemorrhagic cyst with associated right sided pyelonephritis.  Stable left renal cyst.  Bilateral non-obstructing renal stones. Colonic diverticulosis. Bilateral fat containing inguinal hernias.   MRI Brain w/o 5/21 >> no significant intracranial finding, no acute infarction or other acute process. Sinus disease primarily affecting the left maxillary and sphenoid sinus.  The sinuses could contain chronic inspissated mucus or could be fungal sinusitis   ECHO 5/22 >> mild hypokinesis of the distal septum & apex. LVEF 50-55%, LV has low normal function.  LV demonstrates regional wall motion abnormalities.  Mild concentric left ventricular hypertrophy.  Grade I diastolic dysfunction. RV systolic function normal.  RA mildly dilated.  EEG 5/24 >> severe diffuse encephalopathy, nos-specific. No seizure or epileptiform discharges   MRI Pelvis 5/24 >> no acute findings within the pelvis, trache amount free fluid in the posterior pelvis, bilateral fat containing inguinal hernias  Micro Data:  COVID 5/18 >> negative  UA 5/18 >> trace leukocytes, 30 protein, rare bacteria, 0-5 WBC UC 5/18 >> negative  MRSA PCR 5/21 >> negative  BCx2 5/18 >> negative  R Renal Cyst Abscess 5/23 >> E-Coli >> S-imipenem, R-ampicillin, cefazolin, unasyn BCx2 5/20 >> negative  Crypto Antigen 5/21 >> negative  BCx2 5/26 >>   Antimicrobials:  Ceftriaxone 5/18 >> 5/20  Vanco 5/20 >> 5/25 Meropenem 5/24 >> 5/25 Ceftriaxone 5/25 >>  Interim history/subjective:  Pt reports feeling cold and tired  On 4L/[place] Glucose range 100-110 Tmax 102.3 / WBC 16.3  I/O 1.1L UOP, net even  in last 24 hours   Objective   Blood pressure (!) 177/135, pulse 93, temperature (!) 100.6 F (38.1 C), temperature source Oral, resp. rate 19, height 5\' 6"  (1.676 m), weight 101.3 kg, SpO2 98 %.        Intake/Output Summary (Last 24 hours) at 01/05/2020 0826 Last data filed at  01/05/2020 0600 Gross per 24 hour  Intake 610 ml  Output 1128 ml  Net -518 ml   Filed Weights   01/02/20 1505 01/04/20 0500 01/05/20 0500  Weight: 101.3 kg 101.3 kg 101.3 kg    Examination: General: elderly male lying in bed in NAD HEENT: MM pink/moist, poor dentition, anicteric Neuro: AAOx4, speech clear, MAE CV: s1s2 RRR, no m/r/g PULM:  Non-labored on [place] O2, lungs bilaterally clear anterior, diminished right base  GI: soft, bsx4 active  Extremities: warm/dry, trace edema  Skin: no rashes or lesions Right lower posterior flank drain with bloody secretions in [facility] Problem list   Acute Agitated Delirium / Metabolic Encephalopathy   Assessment & Plan:   Sepsis in setting of Right Pyelonephritis with Infected Cyst / E-Coli Isolated Persistent Fever / Leukocytosis  Hx of E-Coli UTI. Concern for infected right renal cyst with stranding. Urology assessing left testicle given heterogeneity on [place] but no abscess  -appreciate ID, Urology input  -abx as above  -follow cultures -await pleural fluid 5/27   Acute Respiratory Failure with Hypoxemia in setting of Sepsis, Possible ALI Right Pleural Effusion  OSA  Concern for component pulmonary edema with elevated BNP, effusions. [place] assessment 5/24 with small effusion, not enough for thoracentesis.  -wean O2 for sats >90% -pulmonary hygiene -IS, mobilize  -appreciate IR assistance with thoracentesis  -bipap QHS & PRN daytime sleep if patient will wear  Left Maxillary Sinus Disease Chronic disease with mucoid impaction vs fungal infection on imaging per radiology. ID does not feel this is fungal infection.  -per ID   CKD IIIa Hypokalemia  -Trend BMP / urinary output -Replace electrolytes as indicated -Avoid nephrotoxic agents, ensure adequate renal perfusion  Chronic Combined Systolic / Diastolic CHF without Exacerbation  Troponin Leak Persistent AF -on coreg, no anticoagulation prior to admit  Cardiac cat in  2019 with non-ischemic systolic CHF, LVEF [id number]. 10/2019 with normal systolic function. Suspect troponin leak demand ischemia.  -ASA -hold home coreg, entresto -avoid QTC prolonging agents  -per prior Cardiology notes 5/7, [name] considering restarting anticoagulation for AF.  Previously held due to anemia   Rheumatoid Arthritis  On prednisone, cosentyx, oxycodone at baseline  -continue prednisone, lyrica, cymbalta   Anemia of Critical Illness  -trend CBC  Mild Elevation of LFT's  Suspect in setting of hypotension -Follow trend, bilirubin normal  -Assess RUQ [place], acute hepatitis panel  -hold lipitor  Gout  -hold allopurinol    At Risk Malnutrition  -diet as tolerated  Best practice:  Diet: as tolerated  Pain/Anxiety/Delirium protocol (if indicated): n/a  VAP protocol (if indicated): n/a DVT prophylaxis: heparin GI prophylaxis: pepcid  Glucose control: SSI  Mobility: BR  Code Status: Full Code  Family Communication:  Wife Forensic scientist) called for update 5/26.  Disposition: SDU.  Per TRH.   Labs    CBC: Recent Labs  Lab 12/30/19 0512 12/30/19 0512 12/31/19 0230 12/31/19 1203 01/01/20 0750 01/01/20 0750 01/01/20 1845 01/02/20 0521 01/03/20 0444 01/04/20 0747 01/05/20 0240  WBC 16.8*   < > 15.8*   < > 14.0*   < > 12.2* 16.4* 19.0* 18.2* 16.3*  NEUTROABS 12.7*  --  11.7*  --  10.0*  --   --  11.0* 14.5*  --   --   HGB 9.7*   < > 9.4*   < > 9.3*   < > 10.5* 10.2* 8.5* 8.1* 8.9*  HCT 30.1*   < > 30.3*   < > 30.1*   < > 33.0* 32.4* 27.7* 26.0* 29.6*  MCV 90.4   < > 92.9   < > 93.2   < > 92.4 92.8 92.0 91.9 93.7  PLT 267   < > 278   < > 316   < > 356 378 355 360 342   < > = values in this interval not displayed.    Basic Metabolic Panel: Recent Labs  Lab 12/31/19 1203 12/31/19 1203 01/01/20 0750 01/01/20 1845 01/01/20 1845 01/01/20 2045 01/02/20 0521 01/02/20 1325 01/02/20 1649 01/03/20 0444 01/03/20 1348 01/04/20 0747 01/05/20 0240  NA 143  --     < > 145  --   --  148*  --   --  146*  --  143 144  K 4.0  --    < > 4.4   < >  --  3.6  --   --  3.2* 3.6 3.7 3.9  CL 109  --    < > 107  --   --  108  --   --  111  --  108 106  CO2 25  --    < > 25  --   --  28  --   --  27  --  27 23  GLUCOSE 120*  --    < > 147*  --   --  135*  --   --  186*  --  116* 110*  BUN 22  --    < > 29*  --   --  33*  --   --  44*  --  46* 34*  CREATININE 1.73*  --    < > 1.68*  --   --  2.09*  --   --  2.13*  --  2.09* 1.65*  CALCIUM 7.7*  --    < > 8.2*  --   --  8.1*  --   --  7.8*  --  7.9* 8.0*  MG 2.1   < >  --   --   --  2.0  --  2.0 2.1 2.2  --  2.3  --   PHOS 2.8  --   --   --   --   --   --  1.9* 1.9* 2.4*  --  2.5  --    < > = values in this interval not displayed.   GFR: Estimated Creatinine Clearance: 50.4 mL/min (A) (by C-G formula based on SCr of 1.65 mg/dL (H)). Recent Labs  Lab 12/31/19 1203 12/31/19 1203 01/01/20 0750 01/01/20 1845 01/01/20 1846 01/01/20 2045 01/02/20 0521 01/03/20 0444 01/03/20 1348 01/04/20 0747 01/04/20 1058 01/05/20 0240  PROCALCITON 0.30   < > 0.35  --   --   --  5.11  --   --   --  2.60 1.47  WBC 15.0*   < > 14.0*   < >  --   --  16.4* 19.0*  --  18.2*  --  16.3*  LATICACIDVEN 1.0  --   --   --  2.3* 2.7*  --   --  2.4*  --   --   --    < > =  values in this interval not displayed.    Liver Function Tests: Recent Labs  Lab 12/31/19 1203 12/31/19 1203 01/01/20 0750 01/02/20 0521 01/03/20 0444 01/04/20 1532 01/05/20 0240  AST 16  --  17 37 67*  --  146*  ALT 17  --  17 30 67*  --  143*  ALKPHOS 50  --  49 58 56  --  76  BILITOT 1.0  --  1.4* 1.2 1.0  --  0.8  PROT 6.7   < > 6.8 7.3 6.6 8.6* 7.0  ALBUMIN 2.2*  --  2.2* 2.6* 2.2*  --  2.2*   < > = values in this interval not displayed.   No results for input(s): LIPASE, AMYLASE in the last 168 hours. No results for input(s): AMMONIA in the last 168 hours.  ABG    Component Value Date/Time   PHART 7.453 (H) 01/02/2020 0355   PCO2ART 36.9  01/02/2020 0355   PO2ART 110 (H) 01/02/2020 0355   HCO3 24.6 01/02/2020 0355   TCO2 25 05/19/2018 1430   ACIDBASEDEF 3.3 (H) 12/31/2019 0908   O2SAT 97.1 01/02/2020 0355     Coagulation Profile: Recent Labs  Lab 12/30/19 1106  INR 1.2    Cardiac Enzymes: No results for input(s): CKTOTAL, CKMB, CKMBINDEX, TROPONINI in the last 168 hours.  HbA1C: HbA1c, POC (controlled diabetic range)  Date/Time Value Ref Range Status  05/02/2019 01:55 PM 7.2 (A) 0.0 - 7.0 % Final  01/29/2018 03:38 PM 10.2 (A) 0.0 - 7.0 % Final   Hgb A1c MFr Bld  Date/Time Value Ref Range Status  12/08/2019 08:00 PM 8.4 (H) 4.8 - 5.6 % Final    Comment:    (NOTE) Pre diabetes:          5.7%-6.4% Diabetes:              >6.4% Glycemic control for   <7.0% adults with diabetes   09/26/2019 11:16 AM 9.2 (H) 4.8 - 5.6 % Final    Comment:    (NOTE) Pre diabetes:          5.7%-6.4% Diabetes:              >6.4% Glycemic control for   <7.0% adults with diabetes     CBG: Recent Labs  Lab 01/04/20 1546 01/04/20 1940 01/04/20 2313 01/05/20 0342 01/05/20 0745  GLUCAP 180* 123* 107* 94 101*     Critical care time:       Noe Gens, MSN, NP-C [facility] 01/05/2020, 8:26 AM   Please see [email or web address] for pager details.

## 2020-01-05 NOTE — Progress Notes (Signed)
Pt not on BIPAP at his time.

## 2020-01-05 NOTE — Evaluation (Signed)
Physical Therapy Re- Evaluation Patient Details Name: Marco [name] MRN: [id number] DOB: Dec 11, 1955 Today's Date: 01/05/2020   History of Present Illness  E-Coli right pyelonephritis.  Initially admitted with concern for sepsis with fever, leukocytosis and elevated lactic acid. Work up concerning for possible infected right renal cyst with associated right renal pyelonephritis.  He had progressive agitated delirium, fevers despite antibiotics.  IR drained renal cyst 5/23 with bloody fluid returned.  Ultimately, required intubation on 5/23 for agitated delirium / impending respiratory failure.  Extubated 5/25.  Clinical Impression  Patient being reevaluated post extubation. Patient reports feeling badly , eventually consented to mobilizing. Patient assisted to sitting and standing briefly, requiring 2 mod assist. Will watch progress for DC planning. Initially was to Dc home. Pt admitted with above diagnosis.  Pt currently with functional limitations due to the deficits listed below (see PT Problem List). Pt will benefit from skilled PT to increase their independence and safety with mobility to allow discharge to the venue listed below.   Pt on 3 L [place], HR I and SPO2 in 90's.   Follow Up Recommendations Home health PT;Supervision for mobility/OOB(depends on progress.)    Equipment Recommendations  Rolling walker with 5" wheels    Recommendations for Other Services       Precautions / Restrictions Precautions Precautions: Fall Precaution Comments: Right drain, very weak, pain Restrictions Weight Bearing Restrictions: No      Mobility  Bed Mobility   Bed Mobility: Rolling;Sidelying to Sit Rolling: Mod assist;+2 for safety/equipment Sidelying to sit: Mod assist;+2 for safety/equipment;+2 for physical assistance       General bed mobility comments: cues to roll, reach for rail, slowly able to move legs to bed edge, assist with trunk.  Transfers Overall transfer level: Needs  assistance Equipment used: Rolling walker (2 wheeled) Transfers: Sit to/from Stand Sit to Stand: Mod assist;+2 physical assistance;+2 safety/equipment         General transfer comment: listing to left, Mod assist to rise from the Bed, stood for bed to be changed, small side step to right.,tends to lean to left  Ambulation/Gait                Stairs            Wheelchair Mobility    Modified Rankin (Stroke Patients Only)       Balance Overall balance assessment: Needs assistance Sitting-balance support: Bilateral upper extremity supported;Feet supported Sitting balance-Leahy Scale: Poor Sitting balance - Comments: listing to left, multiple cues to sit more midline, tending to hold bed   Standing balance support: Bilateral upper extremity supported;Single extremity supported;During functional activity Standing balance-Leahy Scale: Poor Standing balance comment: requires steady assist.                             Pertinent Vitals/Pain Faces Pain Scale: Hurts whole lot Pain Location: back, right side Pain Descriptors / Indicators: Discomfort;Sore;Grimacing;Guarding Pain Intervention(s): Premedicated before session;Monitored during session;Limited activity within patient's tolerance    Home Living Family/patient expects to be discharged to:: Private residence Living Arrangements: Spouse/significant other Available Help at Discharge: Family Type of Home: House Home Access: Level entry     Home Layout: One level Home Equipment: None      Prior Function Level of Independence: Independent         Comments: reports weakness and falls upon recent d/c after hospitalization     Hand Dominance   Dominant Hand: Right  Extremity/Trunk Assessment   Upper Extremity Assessment Upper Extremity Assessment: (some jerking of arms noted)    Lower Extremity Assessment Lower Extremity Assessment: Generalized weakness       Communication    Communication: No difficulties  Cognition Arousal/Alertness: Awake/alert Behavior During Therapy: Flat affect Overall Cognitive Status: Difficult to assess                                 General Comments: patient initially declined, not feeling well, then agreed,      General Comments      Exercises     Assessment/Plan    PT Assessment Patient needs continued PT services  PT Problem List Decreased mobility;Decreased activity tolerance;Decreased balance;Decreased knowledge of use of DME;Decreased strength       PT Treatment Interventions DME instruction;Gait training;Functional mobility training;Therapeutic activities;Patient/family education;Therapeutic exercise    PT Goals (Current goals can be found in the Care Plan section)  Acute Rehab PT Goals Patient Stated Goal: agreed to sitting up PT Goal Formulation: With patient Time For Goal Achievement: 01/19/20 Potential to Achieve Goals: Fair    Frequency Min 3X/week   Barriers to discharge        Co-evaluation PT/OT/SLP Co-Evaluation/Treatment: Yes Reason for Co-Treatment: Complexity of the patient's impairments (multi-system involvement);For patient/therapist safety PT goals addressed during session: Mobility/safety with mobility OT goals addressed during session: ADL's and self-care       AM-PAC PT "6 Clicks" Mobility  Outcome Measure Help needed turning from your back to your side while in a flat bed without using bedrails?: A Lot Help needed moving from lying on your back to sitting on the side of a flat bed without using bedrails?: A Lot Help needed moving to and from a bed to a chair (including a wheelchair)?: A Lot Help needed standing up from a chair using your arms (e.g., wheelchair or bedside chair)?: A Lot Help needed to walk in hospital room?: Total Help needed climbing 3-5 steps with a railing? : Total 6 Click Score: 10    End of Session   Activity Tolerance: Patient limited by  fatigue Patient left: in bed;with call bell/phone within reach;with nursing/sitter in room Nurse Communication: Mobility status PT Visit Diagnosis: Difficulty in walking, not elsewhere classified (R26.2)    Time: [id number] PT Time Calculation (min) (ACUTE ONLY): 15 min   Charges:   PT Evaluation $PT Re-evaluation: 1 Re-eval          [place] Pager [phone] Office [phone]   [name] 01/05/2020, 11:29 AM

## 2020-01-05 NOTE — Progress Notes (Signed)
Referring Physician(s): Dr. Jeffie Pollock  Supervising Physician: Sandi Mariscal  Patient Status:  [facility] - In-pt  Chief Complaint: Abdominal pain, recent fevers, hemorrhagic right renal cyst.   Subjective: Patient extubated today, generalized discomfort observed. Patient able to answer and respond appropriately.   Allergies: Nsaids  Medications: Prior to Admission medications   Medication Sig Start Date End Date Taking? Authorizing Provider  albuterol (PROVENTIL HFA;VENTOLIN HFA) 108 (90 Base) MCG/ACT inhaler Inhale 2 puffs into the lungs every 6 (six) hours as needed for wheezing or shortness of breath. 04/29/18  Yes [name], MD  allopurinol (ZYLOPRIM) 100 MG tablet Take 200 mg by mouth every evening.    Yes [provider]  atorvastatin (LIPITOR) 40 MG tablet Take 1 tablet (40 mg total) by mouth daily. 09/27/19  Yes Sueanne Margarita, MD  carvedilol (COREG) 12.5 MG tablet Take 1 tablet (12.5 mg total) by mouth 2 (two) times daily with a meal. 09/12/19  Yes Larey Dresser, MD  diclofenac Sodium (VOLTAREN) 1 % GEL Apply 4 g topically 4 (four) times daily. 10/23/19  Yes Darr, Marguerita Beards, [name]  DULoxetine (CYMBALTA) 20 MG capsule TAKE 1 CAPSULE BY MOUTH ONCE DAILY Patient taking differently: Take 20 mg by mouth daily.  12/21/19  Yes [name], MD  EQ ASPIRIN ADULT LOW DOSE 81 MG EC tablet TAKE 1 TABLET BY MOUTH ONCE DAILY. SWALLOW WHOLE Patient taking differently: Take 81 mg by mouth at bedtime.  08/01/19  Yes [name], MD  famotidine (PEPCID) 20 MG tablet TAKE 1 TABLET BY MOUTH TWO TIMES DAILY Patient taking differently: Take 20 mg by mouth 2 (two) times daily.  12/21/19  Yes [name], MD  fluticasone (FLONASE) 50 MCG/ACT nasal spray Place 2 sprays into both nostrils daily. 09/16/19  Yes [name], MD  furosemide (LASIX) 40 MG tablet Take 1 tablet (40 mg total) by mouth daily. Can take extra as needed 12/16/19 12/15/20 Yes Bensimhon, Shaune Pascal, MD    Insulin Lispro Prot & Lispro (HUMALOG MIX 75/25 KWIKPEN) (75-25) 100 UNIT/ML Kwikpen Inject 100 Units into the skin daily with breakfast. 12/20/19  Yes Renato Shin, MD  oxyCODONE (OXY IR/ROXICODONE) 5 MG immediate release tablet Take 5 mg by mouth every 6 (six) hours. 11/09/19  Yes [provider]  potassium chloride SA (KLOR-CON) 20 MEQ tablet Take 20 mEq by mouth daily. 12/05/19  Yes [provider]  predniSONE (DELTASONE) 5 MG tablet Take 5 mg by mouth daily. 11/23/19  Yes [provider]  pregabalin (LYRICA) 100 MG capsule Take 100 mg by mouth 3 (three) times daily.    Yes [provider]  sacubitril-valsartan (ENTRESTO) 49-51 MG Take 1 tablet by mouth 2 (two) times daily. Patient taking differently: Take 0.5 tablets by mouth 2 (two) times daily.  12/16/19  Yes Bensimhon, Shaune Pascal, MD  Secukinumab (COSENTYX) 150 MG/ML SOSY Inject 150 mg into the skin every 28 (twenty-eight) days.  06/02/16  Yes [provider]  Tafamidis (VYNDAMAX) 61 MG CAPS Take 61 mg by mouth daily. 09/08/19  Yes Bensimhon, Shaune Pascal, MD  zolpidem (AMBIEN CR) 6.25 MG CR tablet TAKE 1 TABLET BY MOUTH EVERY DAY AT BEDTIME AS NEEDED FOR SLEEP Patient taking differently: Take 6.25 mg by mouth at bedtime.  12/02/19  Yes [name], MD  Accu-Chek FastClix Lancets MISC Use one strip to monitor glucose levels BID; E11.42 12/06/18   Renato Shin, MD  glucose blood (ACCU-CHEK GUIDE) test strip Use  one strip to monitor glucose levels BID; E11.42 12/06/18   Renato Shin, MD  Insulin Pen Needle (PEN NEEDLES) 31G X 6 MM MISC 1 each by Does not apply route daily. E11.9 10/19/19   Renato Shin, MD     Vital Signs: BP (!) 162/77   Pulse 86   Temp 98.8 F (37.1 C) (Oral)   Resp 17   Ht 5\' 6"  (1.676 m)   Wt 223 lb 5.2 oz (101.3 kg)   SpO2 95%   BMI 36.05 kg/m   Physical Exam Abdominal:     Tenderness: There is right CVA tenderness.     Comments: Drain with JP bulb to right mid back.    Skin:    General: Skin is warm and dry.  Neurological:     Mental Status: He is alert and oriented to person, place, and time.     Imaging: DG Abd 1 View  Result Date: 01/01/2020 CLINICAL DATA:  Orogastric tube placement. EXAM: ABDOMEN - 1 VIEW COMPARISON:  Dec 10, 2019 FINDINGS: Enteric catheter follows the expected location of the gastric body terminating in the area of the antrum. Nonobstructive bowel gas pattern. Pigtail catheter overlies the right upper quadrant of the abdomen. Left renal calculus is again noted. IMPRESSION: Enteric catheter follows the expected location of the gastric body terminating in the area of the antrum. Electronically Signed   By: Fidela Salisbury M.D.   On: 01/01/2020 19:05   CT CHEST WO CONTRAST  Result Date: 01/04/2020 CLINICAL DATA:  Pleural effusion EXAM: CT CHEST WITHOUT CONTRAST TECHNIQUE: Multidetector CT imaging of the chest was performed following the standard protocol without IV contrast. COMPARISON:  Chest radiograph Dec 25, 2019; chest CT May 16, 2020. FINDINGS: Cardiovascular: There is no evident thoracic aortic aneurysm. There are scattered foci of calcification in visualized great vessels. There are foci of aortic atherosclerosis. There also multiple foci of coronary artery calcification. There is a fairly small pericardial effusion. The pericardium does not appear thickened. Mediastinum/Nodes: Thyroid appears unremarkable. There are multiple subcentimeter mediastinal lymph nodes. There is no frank adenopathy in the thoracic region by size criteria no esophageal lesions are appreciable. Lungs/Pleura: There is a moderate pleural effusion on the right which has both free-flowing and loculated components. There is consolidation throughout much of the right lower lobe. There is atelectatic change in the left lower lobe as well as mild consolidation in the medial segment of the left lower lobe. Minimal left pleural effusion evident. Upper Abdomen:  Visualized upper abdominal structures appear unremarkable. Musculoskeletal: There is multilevel degenerative change in the thoracic spine. There are no appreciable blastic or lytic bone lesions. No chest wall lesions are evident. IMPRESSION: 1. Moderate pleural effusion on the right with both free-flowing and loculated components. Consolidation is noted throughout most of the right lower lobe, likely a combination of compressive atelectasis and pneumonia. 2. Focal infiltrate with atelectasis medial segment left lower lobe. Atelectasis more posteriorly in the left lower lobe with minimal left pleural effusion. 3.  Fairly small pericardial effusion noted. 4. Aortic atherosclerosis and foci of coronary artery calcification noted. 5. Multiple subcentimeter mediastinal lymph nodes without frank adenopathy by size criteria. Aortic Atherosclerosis (ICD10-I70.0). Electronically Signed   By: Lowella Grip III M.D.   On: 01/04/2020 12:20   MR PELVIS W WO CONTRAST  Result Date: 01/03/2020 CLINICAL DATA:  Fever and sepsis. EXAM: MRI PELVIS WITHOUT AND WITH CONTRAST TECHNIQUE: Multiplanar multisequence MR imaging of the pelvis was performed both before and after administration  of intravenous contrast. CONTRAST:  77mL GADAVIST GADOBUTROL 1 MMOL/ML IV SOLN COMPARISON:  12/28/2019 FINDINGS: Urinary Tract:  The urinary bladder appears normal. Bowel: No bowel wall thickening, inflammation or 4 distension. Sigmoid diverticula identified without evidence of acute inflammation. Vascular/Lymphatic: No pathologically enlarged lymph nodes. No significant vascular abnormality seen. Reproductive:  Prostate gland is unremarkable. Other: There is a trace amount of free fluid within the posterior pelvis, image 17/5. No fluid collections identified. Bilateral fat containing inguinal hernias are identified. Musculoskeletal: No suspicious bone lesions identified. IMPRESSION: 1. No acute findings within the pelvis. 2. Trace amount of free  fluid within the posterior pelvis. 3. Bilateral fat containing inguinal hernias. Electronically Signed   By: Kerby Moors M.D.   On: 01/03/2020 13:20   US SCROTUM  Result Date: 01/02/2020 CLINICAL DATA:  Scrotal anomaly bilateral inguinal hernia EXAM: ULTRASOUND OF SCROTUM TECHNIQUE: Complete ultrasound examination of the testicles, epididymis, and other scrotal structures was performed. COMPARISON:  12/27/2019 FINDINGS: Right testicle Measurements: 3 x 1.6 x 2.7 cm. No mass or microlithiasis visualized. Left testicle Measurements: 3.4 x 1.9 by 3 cm. Heterogeneous echotexture without discrete mass. Right epididymis:  Heterogenous and slightly edematous.  No mass. Left epididymis: Heterogenous in appearance. Small cyst measuring 5 mm. Hydrocele:  Trace right greater than left hydrocele. Varicocele:  None visualized. Echogenic tissue within the bilateral inguinal regions presumably due to fat containing inguinal hernias seen on CT. IMPRESSION: 1. Overall heterogeneous echotexture of the left testis but without well-defined fluid collection within either testis to suggest abscess. 2. Trace bilateral hydroceles 3. Soft tissue echogenicity within the inguinal regions bilaterally, likely corresponding to fat filled inguinal hernias on CT. Scrotal anomaly please pick correct template. Electronically Signed   By: Donavan Foil M.D.   On: 01/02/2020 23:35   DG CHEST PORT 1 VIEW  Result Date: 01/04/2020 CLINICAL DATA:  Respiratory failure. EXAM: PORTABLE CHEST 1 VIEW COMPARISON:  Radiographs 01/03/2020 and 01/02/2020. FINDINGS: 0450 hours. Interval extubation and removal of the enteric tube. Stable cardiomegaly, right pleural effusion and probable bibasilar atelectasis. The pulmonary vascularity appears improved without definite residual edema. There is no pneumothorax. The bones appear unchanged. IMPRESSION: Improved pulmonary vascular congestion following extubation. Stable cardiomegaly, right pleural effusion and  probable bibasilar atelectasis. Electronically Signed   By: Richardean Sale M.D.   On: 01/04/2020 09:06   DG CHEST PORT 1 VIEW  Result Date: 01/03/2020 CLINICAL DATA:  Respiratory failure EXAM: PORTABLE CHEST 1 VIEW COMPARISON:  Radiograph 01/02/2020, CT 05/17/2019 FINDINGS: Endotracheal tube terminates in the mid to upper trachea, 6 cm from the carina. Transesophageal tube tip terminates below the level of imaging, beyond the GE junction. Telemetry leads and support devices overlie the chest. Gradient opacity and pleural thickening in the right lung base compatible with layering pleural effusion. Suspect a small left effusion as well with milder atelectatic changes in the left lower lung. Stable cardiomegaly with a calcified aorta. No acute osseous or soft tissue abnormality. Degenerative changes are present in the imaged spine and shoulders. IMPRESSION: 1. Endotracheal tube terminates in the mid to upper trachea, 6 cm from the carina. 2. Transesophageal tube tip terminates below the level of imaging, beyond the GE junction. 3. Stable appearance of the moderate right and trace left effusions with likely areas of adjacent atelectasis. Underlying consolidation is not excluded. 4. Stable cardiomegaly. Electronically Signed   By: Lovena Le M.D.   On: 01/03/2020 06:36   DG CHEST PORT 1 VIEW  Result Date: 01/02/2020 CLINICAL DATA:  Intubation EXAM: PORTABLE CHEST 1 VIEW COMPARISON:  Radiograph 01/01/2020, CT 05/17/2019 FINDINGS: Endotracheal tube in the mid trachea, 3.4 cm from the carina. Transesophageal tube tip is distal to the GE junction, terminating below the level of imaging. Telemetry leads overlie the chest. Gradient opacity and pleural thickening in the right lung base is suspicious for layering pleural effusion likely with adjacent atelectatic change though some underlying consolidation is not excluded. There is slight blunting of the left cardiophrenic sulcus as well which may reflect additional  trace pleural fluid. Further atelectatic changes also present in the left lung base. Stable cardiomegaly with a calcified, tortuous aorta. No acute osseous or soft tissue abnormality. IMPRESSION: 1. Gradient opacity and pleural thickening in the right lung base is suspicious for layering pleural effusion likely with adjacent atelectatic change though some underlying consolidation is not excluded. 2. Suspect trace left pleural fluid. 3. Stable cardiomegaly. 4.  Aortic Atherosclerosis (ICD10-I70.0). Electronically Signed   By: Lovena Le M.D.   On: 01/02/2020 05:46   DG Chest Port 1 View  Result Date: 01/01/2020 CLINICAL DATA:  Repositioning of ET tube. EXAM: PORTABLE CHEST 1 VIEW COMPARISON:  Jan 01, 2020 FINDINGS: The ETT terminates in the mid trachea in good position. The NG tube terminates below today's film. No pneumothorax. Mild opacity remains in the right base. No other interval changes. IMPRESSION: 1. The ETT is in good position. The NG tube terminates below today's film. 2. Linear opacity in the right base is likely either fluid in a fissure or atelectasis. Mild haziness over the right base is favored represent atelectasis rather than infiltrate given the waxing and waning appearance over the last couple of chest x-rays. Recommend attention on follow-up. Electronically Signed   By: Dorise Bullion III M.D   On: 01/01/2020 20:23   Portable Chest x-ray  Result Date: 01/01/2020 CLINICAL DATA:  ETT placement.  OG tube placement. EXAM: PORTABLE CHEST 1 VIEW COMPARISON:  Jan 01, 2020 FINDINGS: The ETT terminates 1 cm above the carina. Recommend withdrawing 2 cm. No pneumothorax. The OG tube terminates below today's film. Mild opacity in the right base favored represent atelectasis. No other interval changes. IMPRESSION: 1. The ETT terminates 1 cm above the carina. Recommend withdrawing 2 cm. 2. The OG tube terminates below today's film. 3. Mild atelectasis in the right base. These results will be called  to the ordering clinician or representative by the Radiologist Assistant, and communication documented in the PACS or [employer]. Electronically Signed   By: Dorise Bullion III M.D   On: 01/01/2020 19:05   DG CHEST PORT 1 VIEW  Result Date: 01/01/2020 CLINICAL DATA:  Hypoxemia. EXAM: PORTABLE CHEST 1 VIEW COMPARISON:  Jan 01, 2020 FINDINGS: Stable cardiomegaly. The hila and mediastinum are normal. No pneumothorax. Mild opacity in the right base is new in the interval. No other acute abnormalities. IMPRESSION: Mild opacity in the right base may represent atelectasis or developing infiltrate. Recommend short-term follow-up imaging to ensure resolution. No other abnormalities. Electronically Signed   By: Dorise Bullion III M.D   On: 01/01/2020 18:25   EEG adult  Result Date: 01/02/2020 Lora Havens, MD     01/02/2020  9:22 AM Patient Name: Marco [name] MRN: [id number] Epilepsy Attending: Lora Havens Referring Physician/Provider: Dr. Shelly Coss Date: 01/02/2020 Duration: 25.52 mins Patient history: 64 year old male with altered mental status.  EEG evaluate for seizures. Level of alertness: Comatose/sedated AEDs during EEG study: Propofol Technical aspects: This EEG study was done with  scalp electrodes positioned according to the 10-20 International system of electrode placement. Electrical activity was acquired at a sampling rate of 500Hz  and reviewed with a high frequency filter of 70Hz  and a low frequency filter of 1Hz . EEG data were recorded continuously and digitally stored. Description: EEG showed continuous generalized polymorphic 3 to 6 Hz theta-delta slowing.  EEG was reactive to tactile stimulation. Hyperventilation and photic stimulation were not performed.   ABNORMALITY -Continuous slow, generalized IMPRESSION: This study is suggestive of severe diffuse encephalopathy, nonspecific etiology but could be related to sedation. No seizures or epileptiform discharges were seen throughout  the recording. [place]:  CBC: Recent Labs    01/02/20 0521 01/03/20 0444 01/04/20 0747 01/05/20 0240  WBC 16.4* 19.0* 18.2* 16.3*  HGB 10.2* 8.5* 8.1* 8.9*  HCT 32.4* 27.7* 26.0* 29.6*  PLT 378 355 360 342    COAGS: Recent Labs    12/08/19 2014 12/30/19 1106  INR 1.3* 1.2  APTT 33  --     BMP: Recent Labs    01/02/20 0521 01/02/20 0521 01/03/20 0444 01/03/20 1348 01/04/20 0747 01/05/20 0240  NA 148*  --  146*  --  143 144  K 3.6   < > 3.2* 3.6 3.7 3.9  CL 108  --  111  --  108 106  CO2 28  --  27  --  27 23  GLUCOSE 135*  --  186*  --  116* 110*  BUN 33*  --  44*  --  46* 34*  CALCIUM 8.1*  --  7.8*  --  7.9* 8.0*  CREATININE 2.09*  --  2.13*  --  2.09* 1.65*  GFRNONAA 32*  --  32*  --  32* 43*  GFRAA 38*  --  37*  --  38* 50*   < > = values in this interval not displayed.    LIVER FUNCTION TESTS: Recent Labs    01/01/20 0750 01/01/20 0750 01/02/20 0521 01/03/20 0444 01/04/20 1532 01/05/20 0240  BILITOT 1.4*  --  1.2 1.0  --  0.8  AST 17  --  37 67*  --  146*  ALT 17  --  30 67*  --  143*  ALKPHOS 49  --  58 56  --  76  PROT 6.8   < > 7.3 6.6 8.6* 7.0  ALBUMIN 2.2*  --  2.6* 2.2*  --  2.2*   < > = values in this interval not displayed.    Assessment and Plan:  Patient admitted with sepsis, possibly secondary to right renal cyst; required intubation. IR placed right renal drain 01/01/20. Patient extubated today. Pleural effusion noted on imaging. Dr. Pascal Lux performed a diagnostic thoracentesis on patient, yielding 25 cc of fluid.  28 cc drain output documented in the past 24 hours. Approximately 30 cc bloody fluid currently in JP drain.   Continue current drain care - flush drain TID, record output, change dressing daily. Will plan to re-image patient when output is minimal.   Electronically Signed: Theresa Duty, NP 01/05/2020, 3:43 PM   I spent a total of 15 Minutes at the the patient's bedside AND on the patient's  hospital floor or unit, greater than 50% of which was counseling/coordinating care for right renal cyst drain.

## 2020-01-05 NOTE — TOC Progression Note (Signed)
Transition of Care [facility]) - Progression Note    Patient Details  Name: Marco [name] MRN: [id number] Date of Birth: February 02, 1956  Transition of Care [facility]) CM/SW Contact  Leeroy Cha, RN Phone Number: 01/05/2020, 7:59 AM  Clinical Narrative:    Earlean Polka [phone] = 102.3, wbc 16.3, iv rocephin , [place] at 4l/min, bun 34-creat 1.65, bld cultures x2x1d=neg.  Alert and reactive.   Expected Discharge Plan: [place] Barriers to Discharge: Continued Medical Work up  Expected Discharge Plan and Services Expected Discharge Plan: [place]   Discharge Planning Services: CM Consult   Living arrangements for the past 2 months: Single Family Home Expected Discharge Date: (unknown)               DME Arranged: [place] rolling DME Agency: [employer] Date DME Agency Contacted: 12/28/19 Time DME Agency ContactedVR:[id number] Representative spoke with at DME Agency: [place] ([place]) Interventions    Readmission Risk Interventions Readmission Risk Prevention Plan 12/28/2019  Transportation Screening Complete  PCP or Specialist Appt within 3-5 Days Complete  HRI or [place] Complete  Social Work Consult for [place] Planning/Counseling [place] Not Applicable  Medication Review [employer]) Complete  Some recent data might be hidden

## 2020-01-05 NOTE — Progress Notes (Signed)
PROGRESS NOTE    Marco [name]  ZSW:[id number] DOB: October 24, 1955 DOA: 12/27/2019 PCP: [name], MD   Brief Narrative: Marco [name] is a 64 y.o. male with a history of systolic and diastolic congestive heart failure, nonischemic cardiomyopathy, chronic kidney disease stage III, diabetes mellitus type 2, coronary artery disease, hypertension, obesity, obstructive sleep apnea, atrial fibrillation status post ablation 2017, psoriatic arthritis on daily prednisone. Patient presented secondary to lethargy and weakness and found to have evidence of sepsis from recurrent UTI vs prostatitis from recent UTI. Patient developed respiratory and hemodynamic failure requiring ICU admission, intubation/mechanical ventilation and vasopressor support. While admitted, he has had recurrent/persistent fevers on antibiotics. Infectious disease is on board. Respiratory status improved and he was extubated on 5/25 successfully. CT chest concerning for right loculated fluid which is possibly secondary to infection.    Assessment & Plan:   Principal Problem:   Fever Active Problems:   CAD (coronary artery disease)   Atrial fibrillation (HCC)   Essential hypertension   Chronic kidney disease   Morbid obesity due to excess calories (HCC)   Chronic pain   Type 2 diabetes mellitus with stage 3 chronic kidney disease, without long-term current use of insulin (HCC)   OSA (obstructive sleep apnea)   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HCC)   Diabetic polyneuropathy associated with type 2 diabetes mellitus (HCC)   Acute respiratory failure with hypoxia (HCC)   Chronic combined systolic and diastolic CHF (congestive heart failure) (HCC)   Hyperlipidemia   Urethral stricture   Nephrolithiasis   Complex renal cyst   Sepsis Secondary to right sided pyelonephritis with infected cyst. Patient with slightly improved leukocytosis with persistent fevers. Infectious disease consulted and patient has been managed on Ceftriaxone  IV -> Vancomycin/Cefepime -> Vancomycin/meropenem -> Ceftriaxone IV. CT chest obtained on 5/26 suggests free flowing and loculated fluid collection with discussion below. Procalcitonin is trending down.  Right loculated pleural effusion Concern for empyema especially in setting of continued leukocytosis and persistent fevers. Radiology performed thoracentesis on 5/27 with fluid suggesting infected fluid with elevated WBC and predominant neutrophils. Light's criteria met with LDH alone. Cultures obtained and are pending. -Follow-up fluid cultures -Will discuss with CT surgery for consideration of further management (unable to reach 5/27)  Acute respiratory failure with hypoxemia In setting of sepsis. Patient required mechanical ventilation and was extubated on 5/25. Currently managed on oxygen via [place] at [place] as able  Left maxillary sinus disease Severe and chronic as read on CT. Per ID, not likely source of infection and more likely incidental finding  AKI on CKD stage IIIa Baseline creatinine of 1.5. Peak creatinine of 2.13 which correlated with documented hypotension with MAP as low as 54. Now trended back to baseline.  Chronic combined systolic and diastolic heart failure EF of 50-55% which is improved from last year. Grade 1 diastolic dysfunction noted. -Watch fluid status  Rheumatoid arthritis Patient is on prednisone, Cosentyx as an outpatient. -Continue prednisone  Generalized pain -Continue Lyrica and Cymbalta. Titrate Lyrica for renal function  Anemia of chronic disease In setting of kidney disease. Stable.  Elevated LFTs Elevated LDH Possibly secondary to hypotension which was noted from 5/23-5/24. RUQ ultrasound obtained with fatty infiltration of the liver. -Hepatitis panel pending  Gout Patient is on allopurinol which has been held secondary to AKI  Pressure injury Left/Right buttock. POA   DVT prophylaxis: Heparin subq Code Status:   Code Status: Full  Code Family Communication: None at bedside Disposition Plan: Discharge pending resolution/improvement of  fevers in addition to continued workup for infectious source   Consultants:   PCCM  Infectious disease  Urology  Interventional radiology  Procedures:   TRANSTHORACIC ECHOCARDIOGRAM (12/31/2019) IMPRESSIONS    1. Mild hypokinesis of the distal septum and apex. Left ventricular  ejection fraction, by estimation, is 50 to 55%. The left ventricle has low  normal function. The left ventricle demonstrates regional wall motion  abnormalities (see scoring  diagram/findings for description). There is mild concentric left  ventricular hypertrophy. Left ventricular diastolic parameters are  consistent with Grade I diastolic dysfunction (impaired relaxation).  2. Right ventricular systolic function is normal. The right ventricular  size is normal.  3. Right atrial size was mildly dilated.  4. The mitral valve is normal in structure. No evidence of mitral valve  regurgitation. No evidence of mitral stenosis.  5. The aortic valve is tricuspid. Aortic valve regurgitation is not  visualized. No aortic stenosis is present.  6. The inferior vena cava is normal in size with greater than 50%  respiratory variability, suggesting right atrial pressure of 3 mmHg.  Antimicrobials:  Ceftriaxone  Vancomycin  Cefepime  Meropenem   Subjective: Hoping to feel better. Does not like getting the shakes.   Objective: Vitals:   01/05/20 0800 01/05/20 1000 01/05/20 1200 01/05/20 1400  BP: (!) 170/78 134/67 (!) 170/150 (!) 162/77  Pulse: 92 93 83 86  Resp: (!) 21 (!) 22 (!) 22 17  Temp: (!) 100.6 F (38.1 C)  98.8 F (37.1 C)   TempSrc: Oral  Oral   SpO2: 96% 95% 95% 95%  Weight:      Height:        Intake/Output Summary (Last 24 hours) at 01/05/2020 1509 Last data filed at 01/05/2020 1200 Gross per 24 hour  Intake 750 ml  Output 1128 ml  Net -378 ml   Filed Weights    01/02/20 1505 01/04/20 0500 01/05/20 0500  Weight: 101.3 kg 101.3 kg 101.3 kg    Examination:  General exam: Appears calm and comfortable Respiratory system: Decreased. Respiratory effort normal. Cardiovascular system: S1 & S2 heard, RRR. No murmurs, rubs, gallops or clicks. Gastrointestinal system: Abdomen is obese, soft and nontender. No organomegaly or masses felt. Normal bowel sounds heard. Central nervous system: Alert and oriented. Possible mild clonus but difficult exam secondary to patient hypersensitivity to touch Musculoskeletal:  No calf tenderness Skin: No cyanosis. No rashes Psychiatry: Judgement and insight appear normal. Mood & affect appropriate.     Data Reviewed: I have personally reviewed following labs and imaging studies  CBC Lab Results  Component Value Date   WBC 16.3 (H) 01/05/2020   RBC 3.16 (L) 01/05/2020   HGB 8.9 (L) 01/05/2020   HCT 29.6 (L) 01/05/2020   MCV 93.7 01/05/2020   MCH 28.2 01/05/2020   PLT 342 01/05/2020   MCHC 30.1 01/05/2020   RDW 17.0 (H) 01/05/2020   LYMPHSABS 1.7 01/03/2020   MONOABS 2.5 (H) 01/03/2020   EOSABS 0.0 01/03/2020   BASOSABS 0.1 79/09/4095     Last metabolic panel Lab Results  Component Value Date   NA 144 01/05/2020   K 3.9 01/05/2020   CL 106 01/05/2020   CO2 23 01/05/2020   BUN 34 (H) 01/05/2020   CREATININE 1.65 (H) 01/05/2020   GLUCOSE 110 (H) 01/05/2020   GFRNONAA 43 (L) 01/05/2020   GFRAA 50 (L) 01/05/2020   CALCIUM 8.0 (L) 01/05/2020   PHOS 2.5 01/04/2020   PROT 7.0 01/05/2020   ALBUMIN 2.2 (  L) 01/05/2020   LABGLOB 2.9 09/26/2019   AGRATIO 1.2 04/22/2019   BILITOT 0.8 01/05/2020   ALKPHOS 76 01/05/2020   AST 146 (H) 01/05/2020   ALT 143 (H) 01/05/2020   ANIONGAP 15 01/05/2020    CBG (last 3)  Recent Labs    01/05/20 0342 01/05/20 0745 01/05/20 1225  GLUCAP 94 101* 157*     GFR: Estimated Creatinine Clearance: 50.4 mL/min (A) (by C-G formula based on SCr of 1.65 mg/dL  (H)).  Coagulation Profile: Recent Labs  Lab 12/30/19 1106  INR 1.2    Recent Results (from the past 240 hour(s))  SARS Coronavirus 2 by RT PCR (hospital order, performed in [facility] hospital lab) Nasopharyngeal Nasopharyngeal Swab     Status: None   Collection Time: 12/27/19  4:44 AM   Specimen: Nasopharyngeal Swab  Result Value Ref Range Status   SARS Coronavirus 2 NEGATIVE NEGATIVE Final    Comment: (NOTE) SARS-CoV-2 target nucleic acids are NOT DETECTED. The SARS-CoV-2 RNA is generally detectable in upper and lower respiratory specimens during the acute phase of infection. The lowest concentration of SARS-CoV-2 viral copies this assay can detect is 250 copies / mL. A negative result does not preclude SARS-CoV-2 infection and should not be used as the sole basis for treatment or other patient management decisions.  A negative result may occur with improper specimen collection / handling, submission of specimen other than nasopharyngeal swab, presence of viral mutation(s) within the areas targeted by this assay, and inadequate number of viral copies (<250 copies / mL). A negative result must be combined with clinical observations, patient history, and epidemiological information. Fact Sheet for Patients:   StrictlyIdeas.no Fact Sheet for Healthcare Providers: BankingDealers.co.za This test is not yet approved or cleared  by the [place] FDA and has been authorized for detection and/or diagnosis of SARS-CoV-2 by FDA under an Emergency Use Authorization (EUA).  This EUA will remain in effect (meaning this test can be used) for the duration of the COVID-19 declaration under Section 564(b)(1) of the Act, 21 U.S.C. section 360bbb-3(b)(1), unless the authorization is terminated or revoked sooner. Performed at [facility], [place] [address], [place], [place] [address]   Urine culture     Status: None    Collection Time: 12/27/19  4:44 AM   Specimen: Urine, Clean Catch  Result Value Ref Range Status   Specimen Description   Final    URINE, CLEAN CATCH Performed at [facility], [place] [address], [place], [place] [address]    Special Requests   Final    NONE Performed at [facility], [place] [address], [place], [place] [address]    Culture   Final    NO GROWTH Performed at [facility], [place] [address], [place], [place] [address]    Report Status 12/28/2019 FINAL  Final  Blood culture (routine x 2)     Status: None   Collection Time: 12/27/19  7:01 AM   Specimen: BLOOD  Result Value Ref Range Status   Specimen Description   Final    BLOOD RIGHT ANTECUBITAL Performed at [place] [address] [place], [place] [address]    Special Requests   Final    BOTTLES DRAWN AEROBIC AND ANAEROBIC Blood Culture adequate volume Performed at [place] [address], [place], [place] [address]    Culture   Final    NO GROWTH 5 DAYS Performed at [facility], [place] [address], [place], [place] [address]    Report Status 01/01/2020 FINAL  Final  Blood culture (routine x 2)     Status: None   Collection Time: 12/27/19  7:27 AM   Specimen: BLOOD  Result Value Ref Range Status   Specimen Description   Final    BLOOD LEFT ANTECUBITAL Performed at [place] [address], [place], [place] [address]    Special Requests   Final    BOTTLES DRAWN AEROBIC AND ANAEROBIC Blood Culture adequate volume Performed at [place] [address] [place], [place] [address]    Culture   Final    NO GROWTH 5 DAYS Performed at [facility], [place] [address], [place], [place] [address]    Report Status 01/01/2020 FINAL  Final  Culture, blood (routine x 2)     Status: None   Collection Time: 12/29/19 10:06 AM   Specimen: BLOOD  Result Value Ref Range Status    Specimen Description   Final    BLOOD RIGHT ARM Performed at [place] [address], [place], [place] [address]    Special Requests   Final    BOTTLES DRAWN AEROBIC AND ANAEROBIC Blood Culture adequate volume Performed at [place] [address], [place], [place] [address]    Culture   Final    NO GROWTH 5 DAYS Performed at [facility], [place] [address] [place], [place] [address]    Report Status 01/03/2020 FINAL  Final  Culture, blood (routine x 2)     Status: None   Collection Time: 12/29/19 10:07 AM   Specimen: BLOOD  Result Value Ref Range Status   Specimen Description   Final    BLOOD RIGHT ARM Performed at [place] [address] [place], [place] [address]    Special Requests   Final    BOTTLES DRAWN AEROBIC AND ANAEROBIC Blood Culture adequate volume Performed at [place] [address], [place], [place] [address]    Culture   Final    NO GROWTH 5 DAYS Performed at [name], [place] [address], [place], [place] [address]    Report Status 01/03/2020 FINAL  Final  MRSA PCR Screening     Status: None   Collection Time: 12/30/19  1:14 PM   Specimen: Nasal Mucosa; Nasopharyngeal  Result Value Ref Range Status   MRSA by PCR NEGATIVE NEGATIVE Final    Comment:        The GeneXpert MRSA Assay (FDA approved for NASAL specimens only), is one component of a comprehensive MRSA colonization surveillance program. It is not intended to diagnose MRSA infection nor to guide or monitor treatment for MRSA infections. Performed at [facility], [place] [address] [place], [place] [address]   Aerobic/Anaerobic Culture (surgical/deep wound)     Status: None (Preliminary result)   Collection Time: 01/01/20  3:14 PM   Specimen: Abscess  Result Value Ref Range Status   Specimen Description   Final    ABSCESS Performed at [place] [address], [place], [place] [address]    Special Requests   Final    RIGHT RENAL CYST Performed at [place] [address], [place], [place] [address]    Gram Stain   Final    FEW WBC PRESENT, PREDOMINANTLY PMN NO ORGANISMS SEEN Performed at [facility],  [address], [place], [place] [address]    Culture   Final    FEW ESCHERICHIA COLI NO ANAEROBES ISOLATED; CULTURE IN PROGRESS FOR 5 DAYS    Report Status PENDING  Incomplete   Organism ID, Bacteria ESCHERICHIA COLI  Final      Susceptibility   Escherichia coli - MIC*    AMPICILLIN >=32 RESISTANT Resistant     CEFAZOLIN >=64 RESISTANT Resistant     CEFEPIME <=1 SENSITIVE Sensitive     CEFTAZIDIME <=1 SENSITIVE Sensitive     CEFTRIAXONE <=1 SENSITIVE Sensitive     CIPROFLOXACIN <=0.25 SENSITIVE Sensitive     GENTAMICIN <=1 SENSITIVE Sensitive     IMIPENEM <=0.25 SENSITIVE Sensitive     TRIMETH/SULFA <=20 SENSITIVE Sensitive     AMPICILLIN/SULBACTAM >=32 RESISTANT Resistant     PIP/TAZO 8 SENSITIVE Sensitive     * FEW ESCHERICHIA COLI  Culture, blood (Routine X 2) w Reflex to ID Panel     Status: None (Preliminary result)   Collection Time: 01/04/20 10:58 AM   Specimen: BLOOD  Result Value Ref Range Status   Specimen Description   Final    BLOOD LEFT ARM Performed at [place] [address], [place], [place] [address]    Special Requests   Final    BOTTLES DRAWN AEROBIC AND ANAEROBIC Blood Culture adequate volume Performed at [place] [address] [place], [place] [address]    Culture   Final    NO GROWTH < 24 HOURS Performed at [place] [address], [place], [place] [address]    Report Status PENDING  Incomplete  Culture, blood (Routine X 2) w Reflex to ID Panel     Status: None (Preliminary result)   Collection Time: 01/04/20 10:58 AM   Specimen: BLOOD  Result Value Ref Range Status   Specimen Description   Final     BLOOD LEFT ARM Performed at [place] [address], [place], [place] [address]    Special Requests   Final    BOTTLES DRAWN AEROBIC AND ANAEROBIC Blood Culture adequate volume Performed at [place] [address] [place], [place] [address]    Culture   Final    NO GROWTH < 24 HOURS Performed at [place] [address] [place],  [address]    Report Status PENDING  Incomplete        Radiology Studies: CT CHEST WO CONTRAST  Result Date: 01/04/2020 CLINICAL DATA:  Pleural effusion EXAM: CT CHEST WITHOUT CONTRAST TECHNIQUE: Multidetector CT imaging of the chest was performed following the standard protocol without IV contrast. COMPARISON:  Chest radiograph Dec 25, 2019; chest CT May 16, 2020. FINDINGS: Cardiovascular: There is no evident thoracic aortic aneurysm. There are scattered foci of calcification in visualized great vessels. There are foci of aortic atherosclerosis. There also multiple foci of coronary artery calcification. There is a fairly small pericardial effusion. The pericardium does not appear thickened. Mediastinum/Nodes: Thyroid appears unremarkable. There are multiple subcentimeter mediastinal lymph nodes. There is no frank adenopathy in the thoracic region by size criteria no esophageal lesions are appreciable. Lungs/Pleura: There is a moderate pleural effusion on the right which has both free-flowing and loculated components. There is consolidation throughout much of the right lower lobe. There is atelectatic change in the left lower lobe as well as mild consolidation in the medial segment of the left lower lobe. Minimal left pleural effusion evident.  Upper Abdomen: Visualized upper abdominal structures appear unremarkable. Musculoskeletal: There is multilevel degenerative change in the thoracic spine. There are no appreciable blastic or lytic bone lesions. No chest wall lesions are evident. IMPRESSION: 1.  Moderate pleural effusion on the right with both free-flowing and loculated components. Consolidation is noted throughout most of the right lower lobe, likely a combination of compressive atelectasis and pneumonia. 2. Focal infiltrate with atelectasis medial segment left lower lobe. Atelectasis more posteriorly in the left lower lobe with minimal left pleural effusion. 3.  Fairly small pericardial effusion noted. 4. Aortic atherosclerosis and foci of coronary artery calcification noted. 5. Multiple subcentimeter mediastinal lymph nodes without frank adenopathy by size criteria. Aortic Atherosclerosis (ICD10-I70.0). Electronically Signed   By: Lowella Grip III M.D.   On: 01/04/2020 12:20   DG CHEST PORT 1 VIEW  Result Date: 01/04/2020 CLINICAL DATA:  Respiratory failure. EXAM: PORTABLE CHEST 1 VIEW COMPARISON:  Radiographs 01/03/2020 and 01/02/2020. FINDINGS: 0450 hours. Interval extubation and removal of the enteric tube. Stable cardiomegaly, right pleural effusion and probable bibasilar atelectasis. The pulmonary vascularity appears improved without definite residual edema. There is no pneumothorax. The bones appear unchanged. IMPRESSION: Improved pulmonary vascular congestion following extubation. Stable cardiomegaly, right pleural effusion and probable bibasilar atelectasis. Electronically Signed   By: Richardean Sale M.D.   On: 01/04/2020 09:06        Scheduled Meds:  aspirin EC  81 mg Oral QHS   chlorhexidine gluconate (MEDLINE KIT)  15 mL Mouth Rinse BID   Chlorhexidine Gluconate Cloth  6 each Topical Daily   DULoxetine  20 mg Oral Daily   famotidine  20 mg Oral QHS   feeding supplement (ENSURE ENLIVE)  237 mL Oral Q24H   feeding supplement (PRO-STAT SUGAR FREE 64)  30 mL Oral BID   fluticasone  2 spray Each Nare Daily   heparin injection (subcutaneous)  5,000 Units Subcutaneous Q8H   insulin aspart  2-6 Units Subcutaneous Q4H   lidocaine       mouth rinse  15 mL Mouth  Rinse 10 times per day   multivitamin with minerals  1 tablet Oral Daily   nutrition supplement (JUVEN)  1 packet Oral BID BM   polyethylene glycol  17 g Oral Daily   potassium chloride SA  20 mEq Oral Daily   predniSONE  5 mg Oral Q breakfast   pregabalin  100 mg Oral BID   sodium chloride flush  10-40 mL Intracatheter Q12H   sodium chloride flush  5 mL Intravenous Q8H   Continuous Infusions:  sodium chloride Stopped (12/30/19 1700)   cefTRIAXone (ROCEPHIN)  IV 2 g (01/05/20 1300)     LOS: 9 days     Cordelia Poche, MD [facility] 01/05/2020, 3:09 PM  If 7PM-7AM, please contact night-coverage [email or web address]

## 2020-01-05 NOTE — Evaluation (Signed)
Occupational Therapy Evaluation Patient Details Name: Marco [name] MRN: [id number] DOB: 08-08-1956 Today's Date: 01/05/2020    History of Present Illness E-Coli right pyelonephritis.  Initially admitted with concern for sepsis with fever, leukocytosis and elevated lactic acid. Work up concerning for possible infected right renal cyst with associated right renal pyelonephritis.  He had progressive agitated delirium, fevers despite antibiotics.  IR drained renal cyst 5/23 with bloody fluid returned.  Ultimately, required intubation on 5/23 for agitated delirium / impending respiratory failure.  Extubated 5/25.   Clinical Impression   Patient with functional deficits listed below impacting safety and independence with self care. Currently patient require mod A x2 for bed mobility, sit to stand at edge of bed and max difficulty with weight shifting to take few side steps towards head of bed. Patient requires increased time due to fatigue/decreased strength. Patient listing to the left while seated edge of bed with mod assist to correct. At current level would recommend SNF to improve strength before returning home, will continue to follow for pt progress. If SNF not available recommend [facility] with 24/7 supervision.     Follow Up Recommendations  SNF    Equipment Recommendations  Other (comment)(defer to next venue)       Precautions / Restrictions Precautions Precautions: Fall Precaution Comments: Right drain, very weak, pain Restrictions Weight Bearing Restrictions: No      Mobility Bed Mobility Overal bed mobility: Needs Assistance Bed Mobility: Rolling;Sidelying to Sit Rolling: Mod assist;+2 for safety/equipment Sidelying to sit: Mod assist;+2 for safety/equipment;+2 for physical assistance   Sit to supine: Mod assist;Max assist   General bed mobility comments: cues to roll, reach for rail, slowly able to move legs to bed edge, assist with trunk.  Transfers Overall transfer level:  Needs assistance Equipment used: Rolling walker (2 wheeled) Transfers: Sit to/from Stand Sit to Stand: Mod assist;+2 physical assistance;+2 safety/equipment         General transfer comment: listing to left, Mod assist to rise from the Bed, stood for bed to be changed, small side step to right.,tends to lean to left    Balance Overall balance assessment: Needs assistance Sitting-balance support: Bilateral upper extremity supported;Feet supported Sitting balance-Leahy Scale: Poor Sitting balance - Comments: listing to L   Standing balance support: Bilateral upper extremity supported;During functional activity Standing balance-Leahy Scale: Poor Standing balance comment: requires B UE support of walker and assist from therapists                           ADL either performed or assessed with clinical judgement   ADL Overall ADL's : Needs assistance/impaired     Grooming: Minimal assistance;Bed level   Upper Body Bathing: Moderate assistance;Bed level   Lower Body Bathing: Maximal assistance;Total assistance;Bed level   Upper Body Dressing : Moderate assistance;Bed level   Lower Body Dressing: Maximal assistance;Total assistance;Bed level   Toilet Transfer: +2 for physical assistance;+2 for safety/equipment;Moderate assistance;RW Toilet Transfer Details (indicate cue type and reason): simulated with functional mobility, difficulty weight shifting only able to take few steps towards head of bed, very weak Toileting- Clothing Manipulation and Hygiene: Total assistance       Functional mobility during ADLs: Moderate assistance;+2 for physical assistance;+2 for safety/equipment;Cueing for safety;Cueing for sequencing;Rolling walker General ADL Comments: patient requiring increased assistance with self care tasks due to increased weakness, pain, decreased activity tolerance, balance, safety awareness  Pertinent Vitals/Pain Pain Assessment:  Faces Faces Pain Scale: Hurts whole lot Pain Location: back, right side Pain Descriptors / Indicators: Discomfort;Sore;Grimacing;Guarding Pain Intervention(s): Monitored during session;Premedicated before session     Hand Dominance Right   Extremity/Trunk Assessment Upper Extremity Assessment Upper Extremity Assessment: Generalized weakness   Lower Extremity Assessment Lower Extremity Assessment: Defer to PT evaluation       Communication Communication Communication: No difficulties   Cognition Arousal/Alertness: Awake/alert Behavior During Therapy: Flat affect Overall Cognitive Status: Within Functional Limits for tasks assessed                                 General Comments: able to follow 1 step commands   General Comments  pt on 3L O2 VSS            Home Living Family/patient expects to be discharged to:: Private residence Living Arrangements: Spouse/significant other Available Help at Discharge: Family Type of Home: House Home Access: Level entry     Home Layout: One level     Bathroom Shower/Tub: Tub/shower unit;Walk-in shower   Bathroom Toilet: Standard     Home Equipment: None   Additional Comments: info obtained from PT eval      Prior Functioning/Environment Level of Independence: Independent        Comments: reports weakness and falls upon recent d/c after hospitalization        OT Problem List: Decreased activity tolerance;Decreased strength;Impaired balance (sitting and/or standing);Decreased safety awareness;Pain      OT Treatment/Interventions: Self-care/ADL training;Therapeutic exercise;Energy conservation;DME and/or AE instruction;Therapeutic activities;Patient/family education;Balance training    OT Goals(Current goals can be found in the care plan section) Acute Rehab OT Goals Patient Stated Goal: agreed to sitting up OT Goal Formulation: With patient Time For Goal Achievement: 01/19/20 Potential to Achieve  Goals: Good  OT Frequency: Min 2X/week           Co-evaluation PT/OT/SLP Co-Evaluation/Treatment: Yes Reason for Co-Treatment: Complexity of the patient's impairments (multi-system involvement);For patient/therapist safety;To address functional/ADL transfers PT goals addressed during session: Mobility/safety with mobility OT goals addressed during session: ADL's and self-care      AM-PAC OT "6 Clicks" Daily Activity     Outcome Measure Help from another person eating meals?: A Little Help from another person taking care of personal grooming?: A Little Help from another person toileting, which includes using toliet, bedpan, or urinal?: Total Help from another person bathing (including washing, rinsing, drying)?: A Lot Help from another person to put on and taking off regular upper body clothing?: A Lot Help from another person to put on and taking off regular lower body clothing?: Total 6 Click Score: 12   End of Session Equipment Utilized During Treatment: Rolling walker;Oxygen Nurse Communication: Mobility status  Activity Tolerance: Patient limited by fatigue Patient left: in bed;with call bell/phone within reach;with nursing/sitter in room  OT Visit Diagnosis: Other abnormalities of gait and mobility (R26.89);Muscle weakness (generalized) (M62.81);Pain Pain - part of body: (back, chest)                Time: 1003-1020 OT Time Calculation (min): 17 min Charges:  OT General Charges $OT Visit: 1 Visit OT Evaluation $OT Eval Moderate Complexity: 1 Mod  Delbert Phenix OT Pager: [phone]  Rosemary Holms 01/05/2020, 2:01 PM

## 2020-01-06 ENCOUNTER — Encounter (HOSPITAL_COMMUNITY)
Admission: EM | Disposition: A | Discharge status: Home Health | Payer: Self-pay | Source: Home / Self Care | Attending: Internal Medicine

## 2020-01-06 ENCOUNTER — Inpatient Hospital Stay (HOSPITAL_COMMUNITY): Payer: Medicaid Other

## 2020-01-06 DIAGNOSIS — J869 Pyothorax without fistula: Secondary | ICD-10-CM

## 2020-01-06 LAB — CBC WITH DIFFERENTIAL/PLATELET
Abs Immature Granulocytes: 0.2 10*3/uL — ABNORMAL HIGH (ref 0.00–0.07)
Basophils Absolute: 0 10*3/uL (ref 0.0–0.1)
Basophils Relative: 0 %
Eosinophils Absolute: 0.1 10*3/uL (ref 0.0–0.5)
Eosinophils Relative: 1 %
HCT: 25.7 % — ABNORMAL LOW (ref 39.0–52.0)
Hemoglobin: 8 g/dL — ABNORMAL LOW (ref 13.0–17.0)
Immature Granulocytes: 2 %
Lymphocytes Relative: 12 %
Lymphs Abs: 1.6 10*3/uL (ref 0.7–4.0)
MCH: 28.6 pg (ref 26.0–34.0)
MCHC: 31.1 g/dL (ref 30.0–36.0)
MCV: 91.8 fL (ref 80.0–100.0)
Monocytes Absolute: 2.2 10*3/uL — ABNORMAL HIGH (ref 0.1–1.0)
Monocytes Relative: 17 %
Neutro Abs: 9.2 10*3/uL — ABNORMAL HIGH (ref 1.7–7.7)
Neutrophils Relative %: 68 %
Platelets: 454 10*3/uL — ABNORMAL HIGH (ref 150–400)
RBC: 2.8 MIL/uL — ABNORMAL LOW (ref 4.22–5.81)
RDW: 16.7 % — ABNORMAL HIGH (ref 11.5–15.5)
WBC: 13.4 10*3/uL — ABNORMAL HIGH (ref 4.0–10.5)
nRBC: 0 % (ref 0.0–0.2)

## 2020-01-06 LAB — AEROBIC/ANAEROBIC CULTURE W GRAM STAIN (SURGICAL/DEEP WOUND)

## 2020-01-06 LAB — GLUCOSE, CAPILLARY
Glucose-Capillary: 146 mg/dL — ABNORMAL HIGH (ref 70–99)
Glucose-Capillary: 109 mg/dL — ABNORMAL HIGH (ref 70–99)
Glucose-Capillary: 142 mg/dL — ABNORMAL HIGH (ref 70–99)
Glucose-Capillary: 138 mg/dL — ABNORMAL HIGH (ref 70–99)
Glucose-Capillary: 220 mg/dL — ABNORMAL HIGH (ref 70–99)

## 2020-01-06 LAB — COMPREHENSIVE METABOLIC PANEL
ALT: 89 U/L — ABNORMAL HIGH (ref 0–44)
AST: 44 U/L — ABNORMAL HIGH (ref 15–41)
Albumin: 2 g/dL — ABNORMAL LOW (ref 3.5–5.0)
Alkaline Phosphatase: 69 U/L (ref 38–126)
Anion gap: 8 (ref 5–15)
BUN: 31 mg/dL — ABNORMAL HIGH (ref 8–23)
CO2: 28 mmol/L (ref 22–32)
Calcium: 8 mg/dL — ABNORMAL LOW (ref 8.9–10.3)
Chloride: 108 mmol/L (ref 98–111)
Creatinine, Ser: 1.73 mg/dL — ABNORMAL HIGH (ref 0.61–1.24)
GFR calc Af Amer: 47 mL/min — ABNORMAL LOW (ref >60)
GFR calc non Af Amer: 41 mL/min — ABNORMAL LOW (ref >60)
Glucose, Bld: 131 mg/dL — ABNORMAL HIGH (ref 70–99)
Potassium: 4.3 mmol/L (ref 3.5–5.1)
Sodium: 144 mmol/L (ref 135–145)
Total Bilirubin: 0.6 mg/dL (ref 0.3–1.2)
Total Protein: 6.8 g/dL (ref 6.5–8.1)

## 2020-01-06 LAB — PHOSPHORUS: Phosphorus: 2.5 mg/dL (ref 2.5–4.6)

## 2020-01-06 LAB — MAGNESIUM: Magnesium: 2.2 mg/dL (ref 1.7–2.4)

## 2020-01-06 LAB — PROCALCITONIN: Procalcitonin: 0.9 ng/mL

## 2020-01-06 SURGERY — VIDEO ASSISTED THORACOSCOPY (VATS)/DECORTICATION
Anesthesia: General | Laterality: Right

## 2020-01-06 MED ORDER — SODIUM CHLORIDE 0.9% FLUSH
10.0000 mL | Freq: Two times a day (BID) | INTRAVENOUS | Status: DC
  Administered 2020-01-06 – 2020-01-14 (×15): 10 mL

## 2020-01-06 MED ORDER — SODIUM CHLORIDE 0.9% FLUSH: 10.0000 mL | INTRAVENOUS | Status: DC | PRN

## 2020-01-06 NOTE — Progress Notes (Addendum)
Assessment:  1. Possibly infected right renal cyst:  Although is right renal cyst showed inflammatory changes when compared with previous imaging studies this could be due to an infectious etiology but also if he there was bleeding into the cyst.  When it was drained that revealed before consistent with hemorrhage into the cyst.  Culture demonstrated low counts of E Coli, same prior resistance pattern. He is on broad-spectrum, culture targeted, antibiotics (Rocephin).  The drain output is minimal as to be expected.  2. Fever and leukocytosis:  MRI and Scrotal US without source. No concern for prostate abscess, no concern for infectious scrotal process. Patient confirms testicular pain is stable and at his baseline (~2-3 years). May be related to right renal cyst but would have expected positive blood cultures as well as improvement after source control. Would encourage continued workup given clinical course is not consistent with pyelonephritis and infected renal cyst. He has been on chronic steroids.   Plan:  Continue supportive care Agree with continuing IR drain, record outputs.  Defer to ID/IR for interval re-evaluation. Agree with culture directed antibiotics to include E Coli coverage based on IR right renal cyst cultures. Will sign off. Please contact us if further urologic issues arise.  Interval/Subjective: Remains extubated.  Underwent thoracentesis.  Continues to have intermittent fevers; overnight of 103F.  Leukocytosis continues to improve  Reports he is 'okay' this morning, thinks he is voiding well without concern for dysuria or incomplete emptying.   Objective: Vital signs in last 24 hours: Temp:  [98.8 F (37.1 C)-103.1 F (39.5 C)] 99.1 F (37.3 C) (05/28 0800) Pulse Rate:  [76-101] 76 (05/28 0600) Resp:  [13-35] 13 (05/28 0600) BP: (127-195)/(67-150) 127/67 (05/28 0600) SpO2:  [94 %-97 %] 97 % (05/28 0600) Weight:  [105.3 kg] 105.3 kg (05/28  0500)A  Intake/Output from previous day: 05/27 0701 - 05/28 0700 In: 615 [P.O.:480; I.V.:25; IV Piggyback:100] Out: 1020 [Urine:1000; Drains:20] Intake/Output this shift: No intake/output data recorded.  Past Medical History:  Diagnosis Date   Benign colon polyp 08/01/2013   [facility] in [place]. large base tranverse colon polyp was biopsied. polyp was benign with minimal surface hyperplastic change.   Chronic combined systolic and diastolic CHF (congestive heart failure) (HCC)    Chronic pain    CKD (chronic kidney disease), stage III    Diabetes mellitus without complication ([place])    Diverticulosis    Esophageal hiatal hernia 07/29/2013   confirmed on EGD    Esophageal stricture    Essential hypertension    Gastritis 07/29/2013   confirmed on EGD, bx done an negative for intestinal metaplasia, dsyplasia or H. pylori. normal gastric emptying study done 07/13/2013.   GERD (gastroesophageal reflux disease)    Morbid obesity ([place])    Non-obstructive CAD    a. 02/2013 Cath ([place]): nonobs dzs;  b. 09/2014 Myoview ([place]): EF 55%, no ischemia;  c. Cath 05/2018 mild CAD no obstruction.   Persistent atrial fibrillation ([place])    a. 02/2013 s/p rfca in [place], [place]-->prev on Xarelto, d/c'd 2/2 anemia, ? GIB.   Rheumatoid arthritis ([place])    Sigmoid diverticulosis 08/01/2013   confirmed on colonscopy. record scanned into chart    Physical Exam:  Abdomen: Obese, right flank IR drain with SS output.  GU:  Condom catheter on phallus draining clear urine.  Scrotum normal without fluctuance, crepitus or erythema.  Testicles palpably normal with tenderness bilaterally - baseline. No fluctuance noted.  Lab Results: Recent  Labs    01/04/20 0747 01/05/20 0240 01/06/20 0621  WBC 18.2* 16.3* 13.4*  HGB 8.1* 8.9* 8.0*  HCT 26.0* 29.6* 25.7*   BMET Recent Labs    01/05/20 0240 01/06/20 0621  NA 144 144  K 3.9 4.3  CL 106 108  CO2 23 28  GLUCOSE 110* 131*  BUN 34* 31*   CREATININE 1.65* 1.73*  CALCIUM 8.0* 8.0*   No results for input(s): LABURIN in the last 72 hours. Results for orders placed or performed during the hospital encounter of 12/27/19  SARS Coronavirus 2 by RT PCR (hospital order, performed in [facility] hospital lab) Nasopharyngeal Nasopharyngeal Swab     Status: None   Collection Time: 12/27/19  4:44 AM   Specimen: Nasopharyngeal Swab  Result Value Ref Range Status   SARS Coronavirus 2 NEGATIVE NEGATIVE Final    Comment: (NOTE) SARS-CoV-2 target nucleic acids are NOT DETECTED. The SARS-CoV-2 RNA is generally detectable in upper and lower respiratory specimens during the acute phase of infection. The lowest concentration of SARS-CoV-2 viral copies this assay can detect is 250 copies / mL. A negative result does not preclude SARS-CoV-2 infection and should not be used as the sole basis for treatment or other patient management decisions.  A negative result may occur with improper specimen collection / handling, submission of specimen other than nasopharyngeal swab, presence of viral mutation(s) within the areas targeted by this assay, and inadequate number of viral copies (<250 copies / mL). A negative result must be combined with clinical observations, patient history, and epidemiological information. Fact Sheet for Patients:   StrictlyIdeas.no Fact Sheet for Healthcare Providers: BankingDealers.co.za This test is not yet approved or cleared  by the [place] FDA and has been authorized for detection and/or diagnosis of SARS-CoV-2 by FDA under an Emergency Use Authorization (EUA).  This EUA will remain in effect (meaning this test can be used) for the duration of the COVID-19 declaration under Section 564(b)(1) of the Act, 21 U.S.C. section 360bbb-3(b)(1), unless the authorization is terminated or revoked sooner. Performed at [facility], [place] [address] [place], [place] [address]   Urine culture     Status: None   Collection Time: 12/27/19  4:44 AM   Specimen: Urine, Clean Catch  Result Value Ref Range Status   Specimen Description   Final    URINE, CLEAN CATCH Performed at [facility], [place] [address] [place], [place] [address]    Special Requests   Final    NONE Performed at [facility], [place] [address] [place], [place] [address]    Culture   Final    NO GROWTH Performed at [facility], [place] [address] [place], [place] [address]    Report Status 12/28/2019 FINAL  Final  Blood culture (routine x 2)     Status: None   Collection Time: 12/27/19  7:01 AM   Specimen: BLOOD  Result Value Ref Range Status   Specimen Description   Final    BLOOD RIGHT ANTECUBITAL Performed at [place] [address] [place], [place] [address]    Special Requests   Final    BOTTLES DRAWN AEROBIC AND ANAEROBIC Blood Culture adequate volume Performed at [place] [address], [place], [place] [address]    Culture   Final    NO GROWTH 5 DAYS Performed at [facility], [place] [address] [place], [place] [address]  Report Status 01/01/2020 FINAL  Final  Blood culture (routine x 2)     Status: None   Collection Time: 12/27/19  7:27 AM   Specimen: BLOOD  Result Value Ref Range Status   Specimen Description   Final    BLOOD LEFT ANTECUBITAL Performed at [place] [address] [place], [place] [address]    Special Requests   Final    BOTTLES DRAWN AEROBIC AND ANAEROBIC Blood Culture adequate volume Performed at [place] [address], [place], [place] [address]    Culture   Final    NO GROWTH 5 DAYS Performed at [facility], [place] [address] [place], [place] [address]    Report Status 01/01/2020 FINAL  Final  Culture, blood (routine x 2)     Status: None   Collection Time: 12/29/19  10:06 AM   Specimen: BLOOD  Result Value Ref Range Status   Specimen Description   Final    BLOOD RIGHT ARM Performed at [place] [address], [place], [place] [address]    Special Requests   Final    BOTTLES DRAWN AEROBIC AND ANAEROBIC Blood Culture adequate volume Performed at [place] [address] [place], [place] [address]    Culture   Final    NO GROWTH 5 DAYS Performed at [facility], [place] [address], [place], [place] [address]    Report Status 01/03/2020 FINAL  Final  Culture, blood (routine x 2)     Status: None   Collection Time: 12/29/19 10:07 AM   Specimen: BLOOD  Result Value Ref Range Status   Specimen Description   Final    BLOOD RIGHT ARM Performed at [place] [address] [place], [place] [address]    Special Requests   Final    BOTTLES DRAWN AEROBIC AND ANAEROBIC Blood Culture adequate volume Performed at [place] [address] [place], [place] [address]    Culture   Final    NO GROWTH 5 DAYS Performed at [facility], [place] [address] [place], [place] [address]    Report Status 01/03/2020 FINAL  Final  MRSA PCR Screening     Status: None   Collection Time: 12/30/19  1:14 PM   Specimen: Nasal Mucosa; Nasopharyngeal  Result Value Ref Range Status   MRSA by PCR NEGATIVE NEGATIVE Final    Comment:        The GeneXpert MRSA Assay (FDA approved for NASAL specimens only), is one component of a comprehensive MRSA colonization surveillance program. It is not intended to diagnose MRSA infection nor to guide or monitor treatment for MRSA infections. Performed at [facility] [place] [address], [place], [place] [address]   Aerobic/Anaerobic Culture (surgical/deep wound)     Status: None (Preliminary result)   Collection Time: 01/01/20  3:14 PM   Specimen: Abscess  Result Value Ref Range Status   Specimen Description   Final     ABSCESS Performed at [place] [address], [place], [place] [address]    Special Requests   Final    RIGHT RENAL CYST Performed at [place] [address] [place], [place] [address]    Gram Stain   Final    FEW WBC PRESENT, PREDOMINANTLY PMN NO ORGANISMS SEEN Performed at [facility], [place] [address] [place],  [address]    Culture  Final    FEW ESCHERICHIA COLI NO ANAEROBES ISOLATED; CULTURE IN PROGRESS FOR 5 DAYS    Report Status PENDING  Incomplete   Organism ID, Bacteria ESCHERICHIA COLI  Final      Susceptibility   Escherichia coli - MIC*    AMPICILLIN >=32 RESISTANT Resistant     CEFAZOLIN >=64 RESISTANT Resistant     CEFEPIME <=1 SENSITIVE Sensitive     CEFTAZIDIME <=1 SENSITIVE Sensitive     CEFTRIAXONE <=1 SENSITIVE Sensitive     CIPROFLOXACIN <=0.25 SENSITIVE Sensitive     GENTAMICIN <=1 SENSITIVE Sensitive     IMIPENEM <=0.25 SENSITIVE Sensitive     TRIMETH/SULFA <=20 SENSITIVE Sensitive     AMPICILLIN/SULBACTAM >=32 RESISTANT Resistant     PIP/TAZO 8 SENSITIVE Sensitive     * FEW ESCHERICHIA COLI  Culture, blood (Routine X 2) w Reflex to ID Panel     Status: None (Preliminary result)   Collection Time: 01/04/20 10:58 AM   Specimen: BLOOD  Result Value Ref Range Status   Specimen Description   Final    BLOOD LEFT ARM Performed at [place] [address], [place], [place] [address]    Special Requests   Final    BOTTLES DRAWN AEROBIC AND ANAEROBIC Blood Culture adequate volume Performed at [place] [address], [place], [place] [address]    Culture   Final    NO GROWTH 2 DAYS Performed at [place] [address] [place], [place] [address]    Report Status PENDING  Incomplete  Culture, blood (Routine X 2) w Reflex to ID Panel     Status: None (Preliminary result)   Collection Time: 01/04/20 10:58 AM   Specimen: BLOOD  Result  Value Ref Range Status   Specimen Description   Final    BLOOD LEFT ARM Performed at [place] [address] [place], [place] [address]    Special Requests   Final    BOTTLES DRAWN AEROBIC AND ANAEROBIC Blood Culture adequate volume Performed at [place] [address], [place], [place] [address]    Culture   Final    NO GROWTH 2 DAYS Performed at [place] [address], [place], [place] [address]    Report Status PENDING  Incomplete  Body fluid culture     Status: None (Preliminary result)   Collection Time: 01/05/20  3:38 PM   Specimen: Lung, Right; Pleural Fluid  Result Value Ref Range Status   Specimen Description   Final    PLEURAL Performed at [place] [address] [place], [place] [address]    Special Requests   Final    NONE Performed at [facility], [place] [address] [place], [place] [address]    Gram Stain   Final    FEW WBC PRESENT, PREDOMINANTLY PMN NO ORGANISMS SEEN Performed at [facility], [place] [address] [place], [place] [address]    Culture PENDING  Incomplete   Report Status PENDING  Incomplete    Studies/Results: DG Chest 1 View  Result Date: 01/05/2020 CLINICAL DATA:  Status post right thoracentesis. EXAM: CHEST  1 VIEW COMPARISON:  Jan 04, 2020. FINDINGS: Stable cardiomediastinal silhouette. No pneumothorax is noted. Left lung is clear. Mild to moderate right pleural effusion is noted with associated right basilar atelectasis or infiltrate. Bony thorax is unremarkable. IMPRESSION: Mild to moderate right pleural effusion is noted with associated  right basilar atelectasis or infiltrate. No pneumothorax is noted. Electronically Signed   By: Marijo Conception M.D.   On: 01/05/2020 16:12   DG CHEST PORT 1 VIEW  Result Date: 01/06/2020 CLINICAL DATA:  Respiratory failure.  Hypoxia. EXAM: PORTABLE CHEST 1 VIEW COMPARISON:  One-view chest x-ray  01/05/2020 FINDINGS: Heart size is exaggerated by low lung volumes. Right pleural effusion and basilar airspace disease is again seen. Moderate pulmonary vascular congestion is stable. IMPRESSION: 1. Stable right pleural effusion and basilar airspace disease. 2. Stable moderate pulmonary vascular congestion. Electronically Signed   By: San Morelle M.D.   On: 01/06/2020 07:46   US Abdomen Limited RUQ  Result Date: 01/05/2020 CLINICAL DATA:  Elevated LFTs EXAM: ULTRASOUND ABDOMEN LIMITED RIGHT UPPER QUADRANT COMPARISON:  None. FINDINGS: Gallbladder: No gallstones or wall thickening visualized. No sonographic Murphy sign noted by sonographer. Common bile duct: Diameter: 3 mm Liver: Slight increased echogenicity and heterogeneity is noted without focal mass. No biliary ductal dilatation is noted. Portal vein is patent on color Doppler imaging with normal direction of blood flow towards the liver. Other: None. IMPRESSION: Mild increased echogenicity likely related to fatty infiltration. Electronically Signed   By: Inez Catalina M.D.   On: 01/05/2020 16:39   US THORACENTESIS ASP PLEURAL SPACE W/IMG GUIDE  Result Date: 01/05/2020 INDICATION: Recent extubation. Please perform ultrasound-guided right-sided thoracentesis for diagnostic and therapeutic purposes. EXAM: US THORACENTESIS ASP PLEURAL SPACE W/IMG GUIDE COMPARISON:  Chest CT-01/04/2020; chest radiograph-01/04/2020 MEDICATIONS: None. COMPLICATIONS: None immediate. TECHNIQUE: Informed written consent was obtained from the patient after a discussion of the risks, benefits and alternatives to treatment. A timeout was performed prior to the initiation of the procedure. With the patient positioned right lateral decubitus in his hospital bed, ultrasound scanning was performed of the right hemithorax demonstrating a small complex right-sided pleural effusion, similar to the findings seen on preceding chest CT performed 12/15/2019. As such, the inferior,  posterior, lateral aspect of the right hemithorax was prepped and draped in the usual sterile fashion. 1% lidocaine was used for local anesthesia. Under direct ultrasound guidance, a 19 gauge, 7-cm, Yueh catheter was introduced. An ultrasound image was saved for documentation purposes. The thoracentesis was performed, however despite mobilization of the Yueh catheter, only approximately 25 cc of serous fluid was able to be aspirated. The catheter was removed and a dressing was applied. The patient tolerated the procedure well without immediate post procedural complication. The patient was escorted to have an upright chest radiograph. FINDINGS: Small complex right-sided pleural effusion, similar to the findings on preceding chest CT. IMPRESSION: Successful ultrasound-guided right sided thoracentesis yielding only 25 cc of serous pleural fluid. Electronically Signed   By: Sandi Mariscal M.D.   On: 01/05/2020 16:31

## 2020-01-06 NOTE — Progress Notes (Signed)
Patient does not want to use the Bipap tonight.  Patient stated he is not feeling well tonight.

## 2020-01-06 NOTE — Progress Notes (Signed)
PROGRESS NOTE    Marco [name]  LDJ:[id number] DOB: [birth year] DOA: 12/27/2019 PCP: [name], MD   Brief Narrative: Marco [name] is a 64 y.o. male with a history of systolic and diastolic congestive heart failure, nonischemic cardiomyopathy, chronic kidney disease stage III, diabetes mellitus type 2, coronary artery disease, hypertension, obesity, obstructive sleep apnea, atrial fibrillation status post ablation 2017, psoriatic arthritis on daily prednisone. Patient presented secondary to lethargy and weakness and found to have evidence of sepsis from recurrent UTI vs prostatitis from recent UTI. Patient developed respiratory and hemodynamic failure requiring ICU admission, intubation/mechanical ventilation and vasopressor support. While admitted, he has had recurrent/persistent fevers on antibiotics. Infectious disease is on board. Respiratory status improved and he was extubated on 5/25 successfully. CT chest concerning for right loculated fluid which is possibly secondary to infection.    Assessment & Plan:   Principal Problem:   Fever Active Problems:   CAD (coronary artery disease)   Atrial fibrillation (HCC)   Essential hypertension   Chronic kidney disease   Morbid obesity due to excess calories (HCC)   Chronic pain   Type 2 diabetes mellitus with stage 3 chronic kidney disease, without long-term current use of insulin (HCC)   OSA (obstructive sleep apnea)   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HCC)   Diabetic polyneuropathy associated with type 2 diabetes mellitus (HCC)   Acute respiratory failure with hypoxia (HCC)   Chronic combined systolic and diastolic CHF (congestive heart failure) (HCC)   Hyperlipidemia   Urethral stricture   Nephrolithiasis   Complex renal cyst   Sepsis Secondary to right sided pyelonephritis with infected cyst. Patient with slightly improved leukocytosis with persistent fevers. Infectious disease consulted and patient has been managed on Ceftriaxone  IV -> Vancomycin/Cefepime -> Vancomycin/meropenem -> Ceftriaxone IV. CT chest obtained on 5/26 suggests free flowing and loculated fluid collection with discussion below. Procalcitonin is trending down.  Right loculated pleural effusion Concern for empyema especially in setting of continued leukocytosis and persistent fevers. Radiology performed thoracentesis on 5/27 with fluid suggesting infected fluid with elevated WBC and predominant neutrophils. Light's criteria met with LDH alone. Cultures obtained and are pending. Gram stain negative. Discussed with CT surgery on 5/28 with recommendations to transfer to [facility] for decortication. -Follow-up fluid cultures -CT surgery recommendations: surgery planned for today (5/28) -NPO for surgery  Acute respiratory failure with hypoxemia In setting of sepsis. Patient required mechanical ventilation and was extubated on 5/25. Currently managed on oxygen via [facility] as able  Left maxillary sinus disease Severe and chronic as read on CT. Per ID, not likely source of infection and more likely incidental finding  AKI on CKD stage IIIa Baseline creatinine of 1.5. Peak creatinine of 2.13 which correlated with documented hypotension with MAP as low as 54. Now trended back to baseline. Slight bump overnight but still around baseline. No recurrent hypotension noted. -Continue to trend BMP  Chronic combined systolic and diastolic heart failure EF of 50-55% which is improved from last year. Grade 1 diastolic dysfunction noted.  -Watch fluid status/daily weights  Rheumatoid arthritis Patient is on prednisone, Cosentyx as an outpatient. -Continue prednisone  Generalized pain -Continue Lyrica and Cymbalta. Titrate Lyrica for renal function  Anemia of chronic disease In setting of kidney disease. Stable.  Elevated LFTs Elevated LDH Likely secondary to hypotension which was noted from 5/23-5/24. RUQ ultrasound obtained with fatty infiltration of the  liver. Hepatitis panel negative.  Gout Patient is on allopurinol which has been held secondary to AKI  Pressure injury Left/Right buttock. POA   DVT prophylaxis: Heparin subq Code Status:   Code Status: Full Code Family Communication: Called wife with no response Disposition Plan: Discharge pending resolution/improvement of fevers in addition to continued workup for infectious source; patient is now undergoing CT surgery management for empyema.    Consultants:   PCCM  Infectious disease  Urology  Interventional radiology  Cardiothoracic surgery  Procedures:   TRANSTHORACIC ECHOCARDIOGRAM (12/31/2019) IMPRESSIONS    1. Mild hypokinesis of the distal septum and apex. Left ventricular  ejection fraction, by estimation, is 50 to 55%. The left ventricle has low  normal function. The left ventricle demonstrates regional wall motion  abnormalities (see scoring  diagram/findings for description). There is mild concentric left  ventricular hypertrophy. Left ventricular diastolic parameters are  consistent with Grade I diastolic dysfunction (impaired relaxation).  2. Right ventricular systolic function is normal. The right ventricular  size is normal.  3. Right atrial size was mildly dilated.  4. The mitral valve is normal in structure. No evidence of mitral valve  regurgitation. No evidence of mitral stenosis.  5. The aortic valve is tricuspid. Aortic valve regurgitation is not  visualized. No aortic stenosis is present.  6. The inferior vena cava is normal in size with greater than 50%  respiratory variability, suggesting right atrial pressure of 3 mmHg.  Antimicrobials:  Ceftriaxone  Vancomycin  Cefepime  Meropenem   Subjective: Fever overnight. No concerns today.   Objective: Vitals:   01/06/20 0400 01/06/20 0500 01/06/20 0600 01/06/20 0800  BP: (!) 133/92  127/67   Pulse: 83  76   Resp: 14  13   Temp:    99.1 F (37.3 C)  TempSrc:    Oral    SpO2: 96%  97%   Weight:  105.3 kg    Height:        Intake/Output Summary (Last 24 hours) at 01/06/2020 0859 Last data filed at 01/06/2020 0630 Gross per 24 hour  Intake 375 ml  Output 1020 ml  Net -645 ml   Filed Weights   01/04/20 0500 01/05/20 0500 01/06/20 0500  Weight: 101.3 kg 101.3 kg 105.3 kg    Examination:  General exam: Appears calm and comfortable Respiratory system: Clear on left with diminished breath sounds on right; anteriorly auscultated. Respiratory effort normal. Cardiovascular system: S1 & S2 heard, RRR. No murmurs, rubs, gallops or clicks. Gastrointestinal system: Abdomen is obese, soft and nontender. Normal bowel sounds heard. Central nervous system: Alert and oriented. No focal neurological deficits. Musculoskeletal: Tenderness of multiple spots on his body Skin: No cyanosis. No rashes Psychiatry: Judgement and insight appear normal. Mood & affect appropriate.      Data Reviewed: I have personally reviewed following labs and imaging studies  CBC Lab Results  Component Value Date   WBC 13.4 (H) 01/06/2020   RBC 2.80 (L) 01/06/2020   HGB 8.0 (L) 01/06/2020   HCT 25.7 (L) 01/06/2020   MCV 91.8 01/06/2020   MCH 28.6 01/06/2020   PLT 454 (H) 01/06/2020   MCHC 31.1 01/06/2020   RDW 16.7 (H) 01/06/2020   LYMPHSABS 1.6 01/06/2020   MONOABS 2.2 (H) 01/06/2020   EOSABS 0.1 01/06/2020   BASOSABS 0.0 24/23/5361     Last metabolic panel Lab Results  Component Value Date   NA 144 01/06/2020   K 4.3 01/06/2020   CL 108 01/06/2020   CO2 28 01/06/2020   BUN 31 (H) 01/06/2020   CREATININE 1.73 (H) 01/06/2020   GLUCOSE  131 (H) 01/06/2020   GFRNONAA 41 (L) 01/06/2020   GFRAA 47 (L) 01/06/2020   CALCIUM 8.0 (L) 01/06/2020   PHOS 2.5 01/06/2020   PROT 6.8 01/06/2020   ALBUMIN 2.0 (L) 01/06/2020   LABGLOB 2.9 09/26/2019   AGRATIO 1.2 04/22/2019   BILITOT 0.6 01/06/2020   ALKPHOS 69 01/06/2020   AST 44 (H) 01/06/2020   ALT 89 (H) 01/06/2020    ANIONGAP 8 01/06/2020    CBG (last 3)  Recent Labs    01/05/20 2310 01/06/20 0336 01/06/20 0736  GLUCAP 148* 146* 109*     GFR: Estimated Creatinine Clearance: 49.1 mL/min (A) (by C-G formula based on SCr of 1.73 mg/dL (H)).  Coagulation Profile: Recent Labs  Lab 12/30/19 1106  INR 1.2    Recent Results (from the past 240 hour(s))  Culture, blood (routine x 2)     Status: None   Collection Time: 12/29/19 10:06 AM   Specimen: BLOOD  Result Value Ref Range Status   Specimen Description   Final    BLOOD RIGHT ARM Performed at [facility] [address] [place], [place] [address]    Special Requests   Final    BOTTLES DRAWN AEROBIC AND ANAEROBIC Blood Culture adequate volume Performed at [facility] [address], [place], [place] [address]    Culture   Final    NO GROWTH 5 DAYS Performed at [facility], [place] [address] [place], [place] [address]    Report Status 01/03/2020 FINAL  Final  Culture, blood (routine x 2)     Status: None   Collection Time: 12/29/19 10:07 AM   Specimen: BLOOD  Result Value Ref Range Status   Specimen Description   Final    BLOOD RIGHT ARM Performed at [facility] [address] [place], [place] [address]    Special Requests   Final    BOTTLES DRAWN AEROBIC AND ANAEROBIC Blood Culture adequate volume Performed at [facility] [address] [place], [place] [address]    Culture   Final    NO GROWTH 5 DAYS Performed at [facility], [place] [address], [place], [place] [address]    Report Status 01/03/2020 FINAL  Final  MRSA PCR Screening     Status: None   Collection Time: 12/30/19  1:14 PM   Specimen: Nasal Mucosa; Nasopharyngeal  Result Value Ref Range Status   MRSA by PCR NEGATIVE NEGATIVE Final    Comment:        The GeneXpert MRSA Assay (FDA approved for NASAL specimens only), is one component of a comprehensive MRSA  colonization surveillance program. It is not intended to diagnose MRSA infection nor to guide or monitor treatment for MRSA infections. Performed at [facility], [place] [address], [place], [place] [address]   Aerobic/Anaerobic Culture (surgical/deep wound)     Status: None (Preliminary result)   Collection Time: 01/01/20  3:14 PM   Specimen: Abscess  Result Value Ref Range Status   Specimen Description   Final    ABSCESS Performed at [facility] [address] [place], [place] [address]    Special Requests   Final    RIGHT RENAL CYST Performed at [facility] [address], [place], [place] [address]    Gram Stain   Final    FEW WBC PRESENT, PREDOMINANTLY PMN NO ORGANISMS SEEN Performed at [facility], [place]  [address], [place], [place] [address]    Culture   Final    FEW ESCHERICHIA COLI NO ANAEROBES ISOLATED; CULTURE IN PROGRESS FOR 5 DAYS    Report Status PENDING  Incomplete   Organism ID, Bacteria ESCHERICHIA COLI  Final      Susceptibility   Escherichia coli - MIC*    AMPICILLIN >=32 RESISTANT Resistant     CEFAZOLIN >=64 RESISTANT Resistant     CEFEPIME <=1 SENSITIVE Sensitive     CEFTAZIDIME <=1 SENSITIVE Sensitive     CEFTRIAXONE <=1 SENSITIVE Sensitive     CIPROFLOXACIN <=0.25 SENSITIVE Sensitive     GENTAMICIN <=1 SENSITIVE Sensitive     IMIPENEM <=0.25 SENSITIVE Sensitive     TRIMETH/SULFA <=20 SENSITIVE Sensitive     AMPICILLIN/SULBACTAM >=32 RESISTANT Resistant     PIP/TAZO 8 SENSITIVE Sensitive     * FEW ESCHERICHIA COLI  Culture, blood (Routine X 2) w Reflex to ID Panel     Status: None (Preliminary result)   Collection Time: 01/04/20 10:58 AM   Specimen: BLOOD  Result Value Ref Range Status   Specimen Description   Final    BLOOD LEFT ARM Performed at [facility] [address], [place], [place] [address]    Special Requests   Final    BOTTLES DRAWN AEROBIC AND  ANAEROBIC Blood Culture adequate volume Performed at [facility] [address] [place], [place] [address]    Culture   Final    NO GROWTH 2 DAYS Performed at [facility] [address], [place], [place] [address]    Report Status PENDING  Incomplete  Culture, blood (Routine X 2) w Reflex to ID Panel     Status: None (Preliminary result)   Collection Time: 01/04/20 10:58 AM   Specimen: BLOOD  Result Value Ref Range Status   Specimen Description   Final    BLOOD LEFT ARM Performed at [facility] [address] [place], [place] [address]    Special Requests   Final    BOTTLES DRAWN AEROBIC AND ANAEROBIC Blood Culture adequate volume Performed at [facility] [address] [place], [place] [address]    Culture   Final    NO GROWTH 2 DAYS Performed at [facility] [address], [place], [place] [address]    Report Status PENDING  Incomplete  Body fluid culture     Status: None (Preliminary result)   Collection Time: 01/05/20  3:38 PM   Specimen: Lung, Right; Pleural Fluid  Result Value Ref Range Status   Specimen Description   Final    PLEURAL Performed at [facility] [address], [place], [place] [address]    Special Requests   Final    NONE Performed at [facility], [place] [address], [place], [place] [address]    Gram Stain   Final    FEW WBC PRESENT, PREDOMINANTLY PMN NO ORGANISMS SEEN Performed at [facility], [place] [address] [place], [place] [address]    Culture PENDING  Incomplete   Report Status PENDING  Incomplete        Radiology Studies: DG Chest 1 View  Result Date: 01/05/2020 CLINICAL DATA:  Status post right thoracentesis. EXAM: CHEST  1 VIEW COMPARISON:  Jan 04, 2020. FINDINGS: Stable cardiomediastinal silhouette. No pneumothorax is noted. Left lung is clear. Mild to moderate right pleural effusion is noted with associated  right basilar atelectasis or  infiltrate. Bony thorax is unremarkable. IMPRESSION: Mild to moderate right pleural effusion is noted with associated right basilar atelectasis or infiltrate. No pneumothorax is noted. Electronically Signed   By: Marijo Conception M.D.   On: 01/05/2020 16:12   CT CHEST WO CONTRAST  Result Date: 01/04/2020 CLINICAL DATA:  Pleural effusion EXAM: CT CHEST WITHOUT CONTRAST TECHNIQUE: Multidetector CT imaging of the chest was performed following the standard protocol without IV contrast. COMPARISON:  Chest radiograph Dec 25, 2019; chest CT May 16, 2020. FINDINGS: Cardiovascular: There is no evident thoracic aortic aneurysm. There are scattered foci of calcification in visualized great vessels. There are foci of aortic atherosclerosis. There also multiple foci of coronary artery calcification. There is a fairly small pericardial effusion. The pericardium does not appear thickened. Mediastinum/Nodes: Thyroid appears unremarkable. There are multiple subcentimeter mediastinal lymph nodes. There is no frank adenopathy in the thoracic region by size criteria no esophageal lesions are appreciable. Lungs/Pleura: There is a moderate pleural effusion on the right which has both free-flowing and loculated components. There is consolidation throughout much of the right lower lobe. There is atelectatic change in the left lower lobe as well as mild consolidation in the medial segment of the left lower lobe. Minimal left pleural effusion evident. Upper Abdomen: Visualized upper abdominal structures appear unremarkable. Musculoskeletal: There is multilevel degenerative change in the thoracic spine. There are no appreciable blastic or lytic bone lesions. No chest wall lesions are evident. IMPRESSION: 1. Moderate pleural effusion on the right with both free-flowing and loculated components. Consolidation is noted throughout most of the right lower lobe, likely a combination of compressive atelectasis and  pneumonia. 2. Focal infiltrate with atelectasis medial segment left lower lobe. Atelectasis more posteriorly in the left lower lobe with minimal left pleural effusion. 3.  Fairly small pericardial effusion noted. 4. Aortic atherosclerosis and foci of coronary artery calcification noted. 5. Multiple subcentimeter mediastinal lymph nodes without frank adenopathy by size criteria. Aortic Atherosclerosis (ICD10-I70.0). Electronically Signed   By: Lowella Grip III M.D.   On: 01/04/2020 12:20   DG CHEST PORT 1 VIEW  Result Date: 01/06/2020 CLINICAL DATA:  Respiratory failure.  Hypoxia. EXAM: PORTABLE CHEST 1 VIEW COMPARISON:  One-view chest x-ray 01/05/2020 FINDINGS: Heart size is exaggerated by low lung volumes. Right pleural effusion and basilar airspace disease is again seen. Moderate pulmonary vascular congestion is stable. IMPRESSION: 1. Stable right pleural effusion and basilar airspace disease. 2. Stable moderate pulmonary vascular congestion. Electronically Signed   By: San Morelle M.D.   On: 01/06/2020 07:46   US Abdomen Limited RUQ  Result Date: 01/05/2020 CLINICAL DATA:  Elevated LFTs EXAM: ULTRASOUND ABDOMEN LIMITED RIGHT UPPER QUADRANT COMPARISON:  None. FINDINGS: Gallbladder: No gallstones or wall thickening visualized. No sonographic Murphy sign noted by sonographer. Common bile duct: Diameter: 3 mm Liver: Slight increased echogenicity and heterogeneity is noted without focal mass. No biliary ductal dilatation is noted. Portal vein is patent on color Doppler imaging with normal direction of blood flow towards the liver. Other: None. IMPRESSION: Mild increased echogenicity likely related to fatty infiltration. Electronically Signed   By: Inez Catalina M.D.   On: 01/05/2020 16:39   US THORACENTESIS ASP PLEURAL SPACE W/IMG GUIDE  Result Date: 01/05/2020 INDICATION: Recent extubation. Please perform ultrasound-guided right-sided thoracentesis for diagnostic and therapeutic purposes.  EXAM: US THORACENTESIS ASP PLEURAL SPACE W/IMG GUIDE COMPARISON:  Chest CT-01/04/2020; chest radiograph-01/04/2020 MEDICATIONS: None. COMPLICATIONS: None immediate. TECHNIQUE: Informed written consent was obtained from the patient after a discussion  of the risks, benefits and alternatives to treatment. A timeout was performed prior to the initiation of the procedure. With the patient positioned right lateral decubitus in his hospital bed, ultrasound scanning was performed of the right hemithorax demonstrating a small complex right-sided pleural effusion, similar to the findings seen on preceding chest CT performed 12/15/2019. As such, the inferior, posterior, lateral aspect of the right hemithorax was prepped and draped in the usual sterile fashion. 1% lidocaine was used for local anesthesia. Under direct ultrasound guidance, a 19 gauge, 7-cm, Yueh catheter was introduced. An ultrasound image was saved for documentation purposes. The thoracentesis was performed, however despite mobilization of the Yueh catheter, only approximately 25 cc of serous fluid was able to be aspirated. The catheter was removed and a dressing was applied. The patient tolerated the procedure well without immediate post procedural complication. The patient was escorted to have an upright chest radiograph. FINDINGS: Small complex right-sided pleural effusion, similar to the findings on preceding chest CT. IMPRESSION: Successful ultrasound-guided right sided thoracentesis yielding only 25 cc of serous pleural fluid. Electronically Signed   By: Sandi Mariscal M.D.   On: 01/05/2020 16:31        Scheduled Meds:  aspirin EC  81 mg Oral QHS   carvedilol  12.5 mg Oral BID WC   Chlorhexidine Gluconate Cloth  6 each Topical Daily   DULoxetine  20 mg Oral Daily   famotidine  20 mg Oral QHS   feeding supplement (ENSURE ENLIVE)  237 mL Oral Q24H   feeding supplement (PRO-STAT SUGAR FREE 64)  30 mL Oral BID   fluticasone  2 spray Each  Nare Daily   heparin injection (subcutaneous)  5,000 Units Subcutaneous Q8H   insulin aspart  2-6 Units Subcutaneous Q4H   mouth rinse  15 mL Mouth Rinse BID   multivitamin with minerals  1 tablet Oral Daily   nutrition supplement (JUVEN)  1 packet Oral BID BM   polyethylene glycol  17 g Oral Daily   potassium chloride SA  20 mEq Oral Daily   predniSONE  5 mg Oral Q breakfast   pregabalin  100 mg Oral BID   sodium chloride flush  10-40 mL Intracatheter Q12H   sodium chloride flush  5 mL Intravenous Q8H   Continuous Infusions:  sodium chloride Stopped (12/30/19 1700)   cefTRIAXone (ROCEPHIN)  IV 2 g (01/05/20 1300)     LOS: 10 days     Cordelia Poche, MD [facility] 01/06/2020, 8:59 AM  If 7PM-7AM, please contact night-coverage [email or web address]

## 2020-01-06 NOTE — Progress Notes (Signed)
Pt admitted to [facility] today.  Pt vitals taken all within normal range, with the exception of BP of 160/75 and temperature of 101.3 F.  Pt is neuro intact and A&O X4.  Pt placed on telemetry and CCMD notified.  CHG bath completed.  Pt is currently comfortable and will continue to monitored.

## 2020-01-06 NOTE — Progress Notes (Addendum)
[facility] for Infectious Disease   Reason for visit: Follow up on fever  Interval History: thoracentesis done and most c/w infection. Remains febrile.  Nothing on the gram stain from the pleural fluid.  He is being transferred to [facility] for TCTS evaluation.   ceftriaxone day 4 Total antibiotics day 11  Physical Exam: Constitutional:  Vitals:   01/06/20 0600 01/06/20 0800  BP: 127/67   Pulse: 76   Resp: 13   Temp:  99.1 F (37.3 C)  SpO2: 97%    patient appears in NAD Respiratory: Normal respiratory effort; CTA B Cardiovascular: RRR GI: soft, nt, nd  Review of Systems: Constitutional: positive for fevers and chills Gastrointestinal: negative for nausea and diarrhea  Lab Results  Component Value Date   WBC 13.4 (H) 01/06/2020   HGB 8.0 (L) 01/06/2020   HCT 25.7 (L) 01/06/2020   MCV 91.8 01/06/2020   PLT 454 (H) 01/06/2020    Lab Results  Component Value Date   CREATININE 1.73 (H) 01/06/2020   BUN 31 (H) 01/06/2020   NA 144 01/06/2020   K 4.3 01/06/2020   CL 108 01/06/2020   CO2 28 01/06/2020    Lab Results  Component Value Date   ALT 89 (H) 01/06/2020   AST 44 (H) 01/06/2020   ALKPHOS 69 01/06/2020     Microbiology: Recent Results (from the past 240 hour(s))  Culture, blood (routine x 2)     Status: None   Collection Time: 12/29/19 10:06 AM   Specimen: BLOOD  Result Value Ref Range Status   Specimen Description   Final    BLOOD RIGHT ARM Performed at [facility], [place] [address], [place], [place] [address]    Special Requests   Final    BOTTLES DRAWN AEROBIC AND ANAEROBIC Blood Culture adequate volume Performed at [place] [address] [place], [place] [address]    Culture   Final    NO GROWTH 5 DAYS Performed at [facility], [place] [address] [place], [place] [address]    Report Status 01/03/2020 FINAL  Final  Culture, blood (routine x 2)     Status: None   Collection Time: 12/29/19 10:07  AM   Specimen: BLOOD  Result Value Ref Range Status   Specimen Description   Final    BLOOD RIGHT ARM Performed at [place] [address], [place], [place] [address]    Special Requests   Final    BOTTLES DRAWN AEROBIC AND ANAEROBIC Blood Culture adequate volume Performed at [place] [address], [place], [place] [address]    Culture   Final    NO GROWTH 5 DAYS Performed at [facility], [place] [address], [place],  [address]    Report Status 01/03/2020 FINAL  Final  MRSA PCR Screening     Status: None   Collection Time: 12/30/19  1:14 PM   Specimen: Nasal Mucosa; Nasopharyngeal  Result Value Ref Range Status   MRSA by PCR NEGATIVE NEGATIVE Final    Comment:        The GeneXpert MRSA Assay (FDA approved for NASAL specimens only), is one component of a comprehensive MRSA colonization surveillance program. It is not intended to diagnose MRSA infection nor to guide or monitor treatment for MRSA infections. Performed at [facility], [place] [address], [place],  [address]   Aerobic/Anaerobic Culture (surgical/deep wound)     Status: None (  Preliminary result)   Collection Time: 01/01/20  3:14 PM   Specimen: Abscess  Result Value Ref Range Status   Specimen Description   Final    ABSCESS Performed at [place] [address], [place], [place] [address]    Special Requests   Final    RIGHT RENAL CYST Performed at [facility] [address], [place], [place] [address]    Gram Stain   Final    FEW WBC PRESENT, PREDOMINANTLY PMN NO ORGANISMS SEEN Performed at [facility], [place] [address], [place], [place] [address]    Culture   Final    FEW ESCHERICHIA COLI NO ANAEROBES ISOLATED; CULTURE IN PROGRESS FOR 5 DAYS    Report Status PENDING  Incomplete   Organism ID, Bacteria ESCHERICHIA COLI  Final      Susceptibility   Escherichia coli -  MIC*    AMPICILLIN >=32 RESISTANT Resistant     CEFAZOLIN >=64 RESISTANT Resistant     CEFEPIME <=1 SENSITIVE Sensitive     CEFTAZIDIME <=1 SENSITIVE Sensitive     CEFTRIAXONE <=1 SENSITIVE Sensitive     CIPROFLOXACIN <=0.25 SENSITIVE Sensitive     GENTAMICIN <=1 SENSITIVE Sensitive     IMIPENEM <=0.25 SENSITIVE Sensitive     TRIMETH/SULFA <=20 SENSITIVE Sensitive     AMPICILLIN/SULBACTAM >=32 RESISTANT Resistant     PIP/TAZO 8 SENSITIVE Sensitive     * FEW ESCHERICHIA COLI  Culture, blood (Routine X 2) w Reflex to ID Panel     Status: None (Preliminary result)   Collection Time: 01/04/20 10:58 AM   Specimen: BLOOD  Result Value Ref Range Status   Specimen Description   Final    BLOOD LEFT ARM Performed at [place] [address], [place], [place] [address]    Special Requests   Final    BOTTLES DRAWN AEROBIC AND ANAEROBIC Blood Culture adequate volume Performed at [place] [address] [place], [place] [address]    Culture   Final    NO GROWTH 2 DAYS Performed at [place] [address] [place], [place] [address]    Report Status PENDING  Incomplete  Culture, blood (Routine X 2) w Reflex to ID Panel     Status: None (Preliminary result)   Collection Time: 01/04/20 10:58 AM   Specimen: BLOOD  Result Value Ref Range Status   Specimen Description   Final    BLOOD LEFT ARM Performed at [place] [address], [place], [place] [address]    Special Requests   Final    BOTTLES DRAWN AEROBIC AND ANAEROBIC Blood Culture adequate volume Performed at [place] [address], [place], [place] [address]    Culture   Final    NO GROWTH 2 DAYS Performed at [place] [address] [place], [place] [address]    Report Status PENDING  Incomplete  Body fluid culture     Status: None (Preliminary result)   Collection Time: 01/05/20  3:38 PM   Specimen: Lung, Right;  Pleural Fluid  Result Value Ref Range Status   Specimen Description   Final    PLEURAL Performed at [place] [address], [place], [place] [address]    Special Requests   Final    NONE Performed at [facility], [place] [address] [place], [place] [address]    Gram Stain  Final    FEW WBC PRESENT, PREDOMINANTLY PMN NO ORGANISMS SEEN Performed at [place] [address], [place], [place] [address]    Culture PENDING  Incomplete   Report Status PENDING  Incomplete    Impression/Plan:  1. Fever - thoracentesis results c/w infection/empyema.  To be evaluated by TCTS for further management at [facility].  On ceftriaxone for this an renal cyst.  Leukocytosis improved today. Will continue with ceftriaxone Monitor cultures  2.  Renal cyst - infected and E coli on culture, sensitive to ceftriaxone.   Will continue with this and for #1.    3.  Psoriatic arthritis - has been on prednisone and was initially off due to concern for infection.  With the persistent fever the prednisone was restarted with the thought it was due to adrenal insufficiency, but since that time it appears his fever is from #1.  Consider stopping the prednisone if possible with the active infection.    ID will continue to follow at [facility]

## 2020-01-06 NOTE — Progress Notes (Addendum)
     [address]       [place] [address]             [phone]       Full consult note to follow Pt just had cookies, cannot proceed with surgery tonight Will order CT guided drain  Eion Timbrook O Muaad Boehning

## 2020-01-06 NOTE — Progress Notes (Signed)
Referring Physician(s): Dr. Jeffie Pollock  Supervising Physician: Corrie Mckusick  Patient Status:  [facility] - In-pt  Chief Complaint: Abdominal pain, recent fevers, hemorrhagic right renal cyst.   Subjective: Patient awake in bed, states he is having another procedure done today. Complains of some pain around drain site.  Allergies: Nsaids  Medications: Prior to Admission medications   Medication Sig Start Date End Date Taking? Authorizing Provider  albuterol (PROVENTIL HFA;VENTOLIN HFA) 108 (90 Base) MCG/ACT inhaler Inhale 2 puffs into the lungs every 6 (six) hours as needed for wheezing or shortness of breath. 04/29/18  Yes [name], MD  allopurinol (ZYLOPRIM) 100 MG tablet Take 200 mg by mouth every evening.    Yes [provider]  atorvastatin (LIPITOR) 40 MG tablet Take 1 tablet (40 mg total) by mouth daily. 09/27/19  Yes Sueanne Margarita, MD  carvedilol (COREG) 12.5 MG tablet Take 1 tablet (12.5 mg total) by mouth 2 (two) times daily with a meal. 09/12/19  Yes Larey Dresser, MD  diclofenac Sodium (VOLTAREN) 1 % GEL Apply 4 g topically 4 (four) times daily. 10/23/19  Yes Darr, Marguerita Beards, [name]  DULoxetine (CYMBALTA) 20 MG capsule TAKE 1 CAPSULE BY MOUTH ONCE DAILY Patient taking differently: Take 20 mg by mouth daily.  12/21/19  Yes [name], MD  EQ ASPIRIN ADULT LOW DOSE 81 MG EC tablet TAKE 1 TABLET BY MOUTH ONCE DAILY. SWALLOW WHOLE Patient taking differently: Take 81 mg by mouth at bedtime.  08/01/19  Yes [name], MD  famotidine (PEPCID) 20 MG tablet TAKE 1 TABLET BY MOUTH TWO TIMES DAILY Patient taking differently: Take 20 mg by mouth 2 (two) times daily.  12/21/19  Yes [name], MD  fluticasone (FLONASE) 50 MCG/ACT nasal spray Place 2 sprays into both nostrils daily. 09/16/19  Yes [name], MD  furosemide (LASIX) 40 MG tablet Take 1 tablet (40 mg total) by mouth daily. Can take extra as needed 12/16/19 12/15/20 Yes Bensimhon, Shaune Pascal, MD    Insulin Lispro Prot & Lispro (HUMALOG MIX 75/25 KWIKPEN) (75-25) 100 UNIT/ML Kwikpen Inject 100 Units into the skin daily with breakfast. 12/20/19  Yes Renato Shin, MD  oxyCODONE (OXY IR/ROXICODONE) 5 MG immediate release tablet Take 5 mg by mouth every 6 (six) hours. 11/09/19  Yes [provider]  potassium chloride SA (KLOR-CON) 20 MEQ tablet Take 20 mEq by mouth daily. 12/05/19  Yes [provider]  predniSONE (DELTASONE) 5 MG tablet Take 5 mg by mouth daily. 11/23/19  Yes [provider]  pregabalin (LYRICA) 100 MG capsule Take 100 mg by mouth 3 (three) times daily.    Yes [provider]  sacubitril-valsartan (ENTRESTO) 49-51 MG Take 1 tablet by mouth 2 (two) times daily. Patient taking differently: Take 0.5 tablets by mouth 2 (two) times daily.  12/16/19  Yes Bensimhon, Shaune Pascal, MD  Secukinumab (COSENTYX) 150 MG/ML SOSY Inject 150 mg into the skin every 28 (twenty-eight) days.  06/02/16  Yes [provider]  Tafamidis (VYNDAMAX) 61 MG CAPS Take 61 mg by mouth daily. 09/08/19  Yes Bensimhon, Shaune Pascal, MD  zolpidem (AMBIEN CR) 6.25 MG CR tablet TAKE 1 TABLET BY MOUTH EVERY DAY AT BEDTIME AS NEEDED FOR SLEEP Patient taking differently: Take 6.25 mg by mouth at bedtime.  12/02/19  Yes [name], MD  Accu-Chek FastClix Lancets MISC Use one strip to monitor glucose levels BID; E11.42 12/06/18   Renato Shin, MD  glucose blood (  ACCU-CHEK GUIDE) test strip Use one strip to monitor glucose levels BID; E11.42 12/06/18   Renato Shin, MD  Insulin Pen Needle (PEN NEEDLES) 31G X 6 MM MISC 1 each by Does not apply route daily. E11.9 10/19/19   Renato Shin, MD     Vital Signs: BP (!) 155/78   Pulse 81   Temp (!) 100.7 F (38.2 C) (Oral)   Resp 12   Ht 5\' 6"  (1.676 m)   Wt 232 lb 2.3 oz (105.3 kg)   SpO2 96%   BMI 37.47 kg/m   Physical Exam Constitutional:      General: He is not in acute distress. Cardiovascular:     Rate and Rhythm: Normal  rate.  Pulmonary:     Effort: Pulmonary effort is normal.  Abdominal:     Tenderness: There is right CVA tenderness.     Comments: Right renal drain in place. Dressing is clean and dry. Approximately 15 cc serosanguineous fluid in bulb.   Neurological:     Mental Status: He is alert.     Imaging: DG Chest 1 View  Result Date: 01/05/2020 CLINICAL DATA:  Status post right thoracentesis. EXAM: CHEST  1 VIEW COMPARISON:  Jan 04, 2020. FINDINGS: Stable cardiomediastinal silhouette. No pneumothorax is noted. Left lung is clear. Mild to moderate right pleural effusion is noted with associated right basilar atelectasis or infiltrate. Bony thorax is unremarkable. IMPRESSION: Mild to moderate right pleural effusion is noted with associated right basilar atelectasis or infiltrate. No pneumothorax is noted. Electronically Signed   By: Marijo Conception M.D.   On: 01/05/2020 16:12   CT CHEST WO CONTRAST  Result Date: 01/04/2020 CLINICAL DATA:  Pleural effusion EXAM: CT CHEST WITHOUT CONTRAST TECHNIQUE: Multidetector CT imaging of the chest was performed following the standard protocol without IV contrast. COMPARISON:  Chest radiograph Dec 25, 2019; chest CT May 16, 2020. FINDINGS: Cardiovascular: There is no evident thoracic aortic aneurysm. There are scattered foci of calcification in visualized great vessels. There are foci of aortic atherosclerosis. There also multiple foci of coronary artery calcification. There is a fairly small pericardial effusion. The pericardium does not appear thickened. Mediastinum/Nodes: Thyroid appears unremarkable. There are multiple subcentimeter mediastinal lymph nodes. There is no frank adenopathy in the thoracic region by size criteria no esophageal lesions are appreciable. Lungs/Pleura: There is a moderate pleural effusion on the right which has both free-flowing and loculated components. There is consolidation throughout much of the right lower lobe. There is atelectatic  change in the left lower lobe as well as mild consolidation in the medial segment of the left lower lobe. Minimal left pleural effusion evident. Upper Abdomen: Visualized upper abdominal structures appear unremarkable. Musculoskeletal: There is multilevel degenerative change in the thoracic spine. There are no appreciable blastic or lytic bone lesions. No chest wall lesions are evident. IMPRESSION: 1. Moderate pleural effusion on the right with both free-flowing and loculated components. Consolidation is noted throughout most of the right lower lobe, likely a combination of compressive atelectasis and pneumonia. 2. Focal infiltrate with atelectasis medial segment left lower lobe. Atelectasis more posteriorly in the left lower lobe with minimal left pleural effusion. 3.  Fairly small pericardial effusion noted. 4. Aortic atherosclerosis and foci of coronary artery calcification noted. 5. Multiple subcentimeter mediastinal lymph nodes without frank adenopathy by size criteria. Aortic Atherosclerosis (ICD10-I70.0). Electronically Signed   By: Lowella Grip III M.D.   On: 01/04/2020 12:20   MR PELVIS W WO CONTRAST  Result Date:  01/03/2020 CLINICAL DATA:  Fever and sepsis. EXAM: MRI PELVIS WITHOUT AND WITH CONTRAST TECHNIQUE: Multiplanar multisequence MR imaging of the pelvis was performed both before and after administration of intravenous contrast. CONTRAST:  57mL GADAVIST GADOBUTROL 1 MMOL/ML IV SOLN COMPARISON:  12/28/2019 FINDINGS: Urinary Tract:  The urinary bladder appears normal. Bowel: No bowel wall thickening, inflammation or 4 distension. Sigmoid diverticula identified without evidence of acute inflammation. Vascular/Lymphatic: No pathologically enlarged lymph nodes. No significant vascular abnormality seen. Reproductive:  Prostate gland is unremarkable. Other: There is a trace amount of free fluid within the posterior pelvis, image 17/5. No fluid collections identified. Bilateral fat containing  inguinal hernias are identified. Musculoskeletal: No suspicious bone lesions identified. IMPRESSION: 1. No acute findings within the pelvis. 2. Trace amount of free fluid within the posterior pelvis. 3. Bilateral fat containing inguinal hernias. Electronically Signed   By: Kerby Moors M.D.   On: 01/03/2020 13:20   US SCROTUM  Result Date: 01/02/2020 CLINICAL DATA:  Scrotal anomaly bilateral inguinal hernia EXAM: ULTRASOUND OF SCROTUM TECHNIQUE: Complete ultrasound examination of the testicles, epididymis, and other scrotal structures was performed. COMPARISON:  12/27/2019 FINDINGS: Right testicle Measurements: 3 x 1.6 x 2.7 cm. No mass or microlithiasis visualized. Left testicle Measurements: 3.4 x 1.9 by 3 cm. Heterogeneous echotexture without discrete mass. Right epididymis:  Heterogenous and slightly edematous.  No mass. Left epididymis: Heterogenous in appearance. Small cyst measuring 5 mm. Hydrocele:  Trace right greater than left hydrocele. Varicocele:  None visualized. Echogenic tissue within the bilateral inguinal regions presumably due to fat containing inguinal hernias seen on CT. IMPRESSION: 1. Overall heterogeneous echotexture of the left testis but without well-defined fluid collection within either testis to suggest abscess. 2. Trace bilateral hydroceles 3. Soft tissue echogenicity within the inguinal regions bilaterally, likely corresponding to fat filled inguinal hernias on CT. Scrotal anomaly please pick correct template. Electronically Signed   By: Donavan Foil M.D.   On: 01/02/2020 23:35   DG CHEST PORT 1 VIEW  Result Date: 01/06/2020 CLINICAL DATA:  Respiratory failure.  Hypoxia. EXAM: PORTABLE CHEST 1 VIEW COMPARISON:  One-view chest x-ray 01/05/2020 FINDINGS: Heart size is exaggerated by low lung volumes. Right pleural effusion and basilar airspace disease is again seen. Moderate pulmonary vascular congestion is stable. IMPRESSION: 1. Stable right pleural effusion and basilar  airspace disease. 2. Stable moderate pulmonary vascular congestion. Electronically Signed   By: San Morelle M.D.   On: 01/06/2020 07:46   DG CHEST PORT 1 VIEW  Result Date: 01/04/2020 CLINICAL DATA:  Respiratory failure. EXAM: PORTABLE CHEST 1 VIEW COMPARISON:  Radiographs 01/03/2020 and 01/02/2020. FINDINGS: 0450 hours. Interval extubation and removal of the enteric tube. Stable cardiomegaly, right pleural effusion and probable bibasilar atelectasis. The pulmonary vascularity appears improved without definite residual edema. There is no pneumothorax. The bones appear unchanged. IMPRESSION: Improved pulmonary vascular congestion following extubation. Stable cardiomegaly, right pleural effusion and probable bibasilar atelectasis. Electronically Signed   By: Richardean Sale M.D.   On: 01/04/2020 09:06   DG CHEST PORT 1 VIEW  Result Date: 01/03/2020 CLINICAL DATA:  Respiratory failure EXAM: PORTABLE CHEST 1 VIEW COMPARISON:  Radiograph 01/02/2020, CT 05/17/2019 FINDINGS: Endotracheal tube terminates in the mid to upper trachea, 6 cm from the carina. Transesophageal tube tip terminates below the level of imaging, beyond the GE junction. Telemetry leads and support devices overlie the chest. Gradient opacity and pleural thickening in the right lung base compatible with layering pleural effusion. Suspect a small left effusion as well with  milder atelectatic changes in the left lower lung. Stable cardiomegaly with a calcified aorta. No acute osseous or soft tissue abnormality. Degenerative changes are present in the imaged spine and shoulders. IMPRESSION: 1. Endotracheal tube terminates in the mid to upper trachea, 6 cm from the carina. 2. Transesophageal tube tip terminates below the level of imaging, beyond the GE junction. 3. Stable appearance of the moderate right and trace left effusions with likely areas of adjacent atelectasis. Underlying consolidation is not excluded. 4. Stable cardiomegaly.  Electronically Signed   By: Lovena Le M.D.   On: 01/03/2020 06:36   US Abdomen Limited RUQ  Result Date: 01/05/2020 CLINICAL DATA:  Elevated LFTs EXAM: ULTRASOUND ABDOMEN LIMITED RIGHT UPPER QUADRANT COMPARISON:  None. FINDINGS: Gallbladder: No gallstones or wall thickening visualized. No sonographic Murphy sign noted by sonographer. Common bile duct: Diameter: 3 mm Liver: Slight increased echogenicity and heterogeneity is noted without focal mass. No biliary ductal dilatation is noted. Portal vein is patent on color Doppler imaging with normal direction of blood flow towards the liver. Other: None. IMPRESSION: Mild increased echogenicity likely related to fatty infiltration. Electronically Signed   By: Inez Catalina M.D.   On: 01/05/2020 16:39   US THORACENTESIS ASP PLEURAL SPACE W/IMG GUIDE  Result Date: 01/05/2020 INDICATION: Recent extubation. Please perform ultrasound-guided right-sided thoracentesis for diagnostic and therapeutic purposes. EXAM: US THORACENTESIS ASP PLEURAL SPACE W/IMG GUIDE COMPARISON:  Chest CT-01/04/2020; chest radiograph-01/04/2020 MEDICATIONS: None. COMPLICATIONS: None immediate. TECHNIQUE: Informed written consent was obtained from the patient after a discussion of the risks, benefits and alternatives to treatment. A timeout was performed prior to the initiation of the procedure. With the patient positioned right lateral decubitus in his hospital bed, ultrasound scanning was performed of the right hemithorax demonstrating a small complex right-sided pleural effusion, similar to the findings seen on preceding chest CT performed 12/15/2019. As such, the inferior, posterior, lateral aspect of the right hemithorax was prepped and draped in the usual sterile fashion. 1% lidocaine was used for local anesthesia. Under direct ultrasound guidance, a 19 gauge, 7-cm, Yueh catheter was introduced. An ultrasound image was saved for documentation purposes. The thoracentesis was performed,  however despite mobilization of the Yueh catheter, only approximately 25 cc of serous fluid was able to be aspirated. The catheter was removed and a dressing was applied. The patient tolerated the procedure well without immediate post procedural complication. The patient was escorted to have an upright chest radiograph. FINDINGS: Small complex right-sided pleural effusion, similar to the findings on preceding chest CT. IMPRESSION: Successful ultrasound-guided right sided thoracentesis yielding only 25 cc of serous pleural fluid. Electronically Signed   By: Sandi Mariscal M.D.   On: 01/05/2020 16:31    Labs:  CBC: Recent Labs    01/03/20 0444 01/04/20 0747 01/05/20 0240 01/06/20 0621  WBC 19.0* 18.2* 16.3* 13.4*  HGB 8.5* 8.1* 8.9* 8.0*  HCT 27.7* 26.0* 29.6* 25.7*  PLT 355 360 342 454*    COAGS: Recent Labs    12/08/19 2014 12/30/19 1106  INR 1.3* 1.2  APTT 33  --     BMP: Recent Labs    01/03/20 0444 01/03/20 0444 01/03/20 1348 01/04/20 0747 01/05/20 0240 01/06/20 0621  NA 146*  --   --  143 144 144  K 3.2*   < > 3.6 3.7 3.9 4.3  CL 111  --   --  108 106 108  CO2 27  --   --  27 23 28   GLUCOSE 186*  --   --  116* 110* 131*  BUN 44*  --   --  46* 34* 31*  CALCIUM 7.8*  --   --  7.9* 8.0* 8.0*  CREATININE 2.13*  --   --  2.09* 1.65* 1.73*  GFRNONAA 32*  --   --  32* 43* 41*  GFRAA 37*  --   --  38* 50* 47*   < > = values in this interval not displayed.    LIVER FUNCTION TESTS: Recent Labs    01/02/20 0521 01/02/20 0521 01/03/20 0444 01/04/20 1532 01/05/20 0240 01/06/20 0621  BILITOT 1.2  --  1.0  --  0.8 0.6  AST 37  --  67*  --  146* 44*  ALT 30  --  67*  --  143* 89*  ALKPHOS 58  --  56  --  76 69  PROT 7.3   < > 6.6 8.6* 7.0 6.8  ALBUMIN 2.6*  --  2.2*  --  2.2* 2.0*   < > = values in this interval not displayed.    Assessment and Plan: Patient admitted with sepsis, possibly secondary to right renal cyst; required intubation. IR placed right renal  drain 01/01/20. Patient now extubated. Pleural effusion noted on imaging. Dr. Pascal Lux performed a diagnostic thoracentesis on patient, yielding 25 cc of fluid - lab results consistent with infection/empyema. Patient scheduled to be transferred to [facility] for consultation with TCTS, possible VATS.   20 cc drain output documented in the past 24 hours. Approximately 15 cc bloody fluid currently in JP drain.   Continue current drain care - flush drain TID, record output, change dressing daily. Will plan to re-image patient when output is minimal  Electronically Signed: Theresa Duty, NP 01/06/2020, 1:40 PM   I spent a total of 15 Minutes at the the patient's bedside AND on the patient's hospital floor or unit, greater than 50% of which was counseling/coordinating care for right renal cyst drain

## 2020-01-06 NOTE — Progress Notes (Signed)
Pt going to [facility] for VATS with cardiothoracic surgery.  PCCM will sign off for now.  Please call if assistance needed after he has surgery.  Chesley Mires, MD [place] Pager - [phone] 01/06/2020, 11:16 AM

## 2020-01-07 ENCOUNTER — Inpatient Hospital Stay (HOSPITAL_COMMUNITY): Payer: Medicaid Other

## 2020-01-07 ENCOUNTER — Encounter (HOSPITAL_COMMUNITY): Payer: Self-pay | Admitting: Internal Medicine

## 2020-01-07 DIAGNOSIS — I5042 Chronic combined systolic (congestive) and diastolic (congestive) heart failure: Secondary | ICD-10-CM

## 2020-01-07 DIAGNOSIS — E119 Type 2 diabetes mellitus without complications: Secondary | ICD-10-CM

## 2020-01-07 DIAGNOSIS — N183 Chronic kidney disease, stage 3 unspecified: Secondary | ICD-10-CM

## 2020-01-07 DIAGNOSIS — J869 Pyothorax without fistula: Secondary | ICD-10-CM

## 2020-01-07 DIAGNOSIS — G8929 Other chronic pain: Secondary | ICD-10-CM

## 2020-01-07 DIAGNOSIS — E1122 Type 2 diabetes mellitus with diabetic chronic kidney disease: Secondary | ICD-10-CM

## 2020-01-07 HISTORY — PX: IR GUIDED DRAIN W CATHETER PLACEMENT: IMG719

## 2020-01-07 HISTORY — PX: IR PERC PLEURAL DRAIN W/INDWELL CATH W/IMG GUIDE: IMG5383

## 2020-01-07 LAB — GLUCOSE, CAPILLARY
Glucose-Capillary: 114 mg/dL — ABNORMAL HIGH (ref 70–99)
Glucose-Capillary: 116 mg/dL — ABNORMAL HIGH (ref 70–99)
Glucose-Capillary: 142 mg/dL — ABNORMAL HIGH (ref 70–99)
Glucose-Capillary: 143 mg/dL — ABNORMAL HIGH (ref 70–99)
Glucose-Capillary: 211 mg/dL — ABNORMAL HIGH (ref 70–99)
Glucose-Capillary: 226 mg/dL — ABNORMAL HIGH (ref 70–99)

## 2020-01-07 MED ORDER — POLYETHYLENE GLYCOL 3350 17 G PO PACK: 17.0000 g | PACK | Freq: Every day | ORAL | Status: DC | PRN

## 2020-01-07 MED ORDER — MIDAZOLAM HCL 2 MG/2ML IJ SOLN
INTRAMUSCULAR | Status: AC
  Filled 2020-01-07: qty 4

## 2020-01-07 MED ORDER — LIDOCAINE HCL 1 % IJ SOLN
INTRAMUSCULAR | Status: AC
  Filled 2020-01-07: qty 20

## 2020-01-07 MED ORDER — MIDAZOLAM HCL 2 MG/2ML IJ SOLN
INTRAMUSCULAR | Status: AC | PRN
  Administered 2020-01-07: 1 mg via INTRAVENOUS

## 2020-01-07 MED ORDER — SENNOSIDES-DOCUSATE SODIUM 8.6-50 MG PO TABS: 2.0000 | ORAL_TABLET | Freq: Every evening | ORAL | Status: DC | PRN

## 2020-01-07 MED ORDER — FENTANYL CITRATE (PF) 100 MCG/2ML IJ SOLN
INTRAMUSCULAR | Status: AC | PRN
  Administered 2020-01-07: 50 ug via INTRAVENOUS

## 2020-01-07 MED ORDER — FENTANYL CITRATE (PF) 100 MCG/2ML IJ SOLN
INTRAMUSCULAR | Status: AC
  Filled 2020-01-07: qty 2

## 2020-01-07 MED ORDER — LIDOCAINE HCL 1 % IJ SOLN
INTRAMUSCULAR | Status: AC | PRN
  Administered 2020-01-07: 5 mL

## 2020-01-07 NOTE — Progress Notes (Signed)
Pt arrived back from IR for chest tube placement.  Chest tube functioning properly with 55 cc output.  Vitals taken and all within normal range, MEW score green level 0.  Pt is resting comfortably and will continue to be monitored.

## 2020-01-07 NOTE — Consult Note (Signed)
[address]       ,[place] [address]             [phone]                    Andrus Tomassetti [place] Medical Record [id number] Date of Birth: 27-Jul-1956  Referring: No ref. provider found Primary Care: [name], MD Primary Cardiologist: [name], MD  Chief Complaint:    Chief Complaint  Patient presents with  . Weakness    History of Present Illness:    Marco [name] 64 y.o. male transferred from [facility] for management of a right pleural effusion.  He was admitted with urosepsis due to an infected right renal cyst, and respiratory failure.  He has since come off of the vent but has developed a right pleural effusion.  He complains mostly of back pain.  He was transferred to [facility] and has undergone a CT-guided drain placement.    Past Medical History:  Diagnosis Date  . Benign colon polyp 08/01/2013   [facility] in [place]. large base tranverse colon polyp was biopsied. polyp was benign with minimal surface hyperplastic change.  . Chronic combined systolic and diastolic CHF (congestive heart failure) ([place])   . Chronic pain   . CKD (chronic kidney disease), stage III   . Diabetes mellitus without complication ([place])   . Diverticulosis   . Esophageal hiatal hernia 07/29/2013   confirmed on EGD   . Esophageal stricture   . Essential hypertension   . Gastritis 07/29/2013   confirmed on EGD, bx done an negative for intestinal metaplasia, dsyplasia or H. pylori. normal gastric emptying study done 07/13/2013.  [name] GERD (gastroesophageal reflux disease)   . Morbid obesity ([place])   . Non-obstructive CAD    a. 02/2013 Cath ([place]): nonobs dzs;  b. 09/2014 Myoview ([place]): EF 55%, no ischemia;  c. Cath 05/2018 mild CAD no obstruction.  . Persistent atrial fibrillation ([place])    a. 02/2013 s/p rfca in [place], [place]-->prev on Xarelto, d/c'd 2/2 anemia, ? GIB.  [name] Rheumatoid arthritis ([place])   . Sigmoid diverticulosis  08/01/2013   confirmed on colonscopy. record scanned into chart    Past Surgical History:  Procedure Laterality Date  . CARDIAC CATHETERIZATION  05/2014   ablation for atrial fibrillation  . CARDIAC CATHETERIZATION N/A 11/16/2015   Procedure: Left Heart Cath and Coronary Angiography;  Surgeon: Leonie Man, MD;  Location: [facility];  Service: Cardiovascular;  Laterality: N/A;  . COLON RESECTION  09/2013   due to large, abnormal polpy. non cancerous per patient.   . COLON SURGERY  09/2014   colon resection   . IR GUIDED DRAIN W CATHETER PLACEMENT  01/07/2020  . LEFT HEART CATH AND CORONARY ANGIOGRAPHY N/A 05/19/2018   Procedure: LEFT HEART CATH AND CORONARY ANGIOGRAPHY;  Surgeon: Wellington Hampshire, MD;  Location: [facility];  Service: Cardiovascular;  Laterality: N/A;    Family History  Problem Relation Age of Onset  . Hypertension Mother   . Diabetes Mother   . COPD Mother   . Lung cancer Mother        Smoker   . Hypertension Father   . Diabetes Father   . Heart Problems Father   . COPD Father   . Colon cancer Neg Hx   . Stomach cancer Neg Hx      Social History   Tobacco  Use  Smoking Status Former Smoker  . Packs/day: 0.50  . Years: 10.00  . Pack years: 5.00  . Types: Cigarettes  . Quit date: 04/11/2008  . Years since quitting: 11.7  Smokeless Tobacco Never Used    Social History   Substance and Sexual Activity  Alcohol Use No  . Alcohol/week: 0.0 standard drinks     Allergies  Allergen Reactions  . Nsaids Other (See Comments)    Stomach pains. Ulcers - stated by patient     Current Facility-Administered Medications  Medication Dose Route Frequency Provider Last Rate Last Admin  . 0.9 %  sodium chloride infusion   Intravenous PRN Mariel Aloe, MD   Stopped at 12/30/19 1700  . acetaminophen (TYLENOL) tablet 650 mg  650 mg Oral Q6H PRN Mariel Aloe, MD   650 mg at 01/06/20 1700  . albuterol (PROVENTIL) (2.5 MG/3ML) 0.083% nebulizer  solution 2.5 mg  2.5 mg Inhalation Q6H PRN Mariel Aloe, MD   2.5 mg at 12/31/19 0900  . aspirin EC tablet 81 mg  81 mg Oral QHS Mariel Aloe, MD   81 mg at 01/06/20 2055  . carvedilol (COREG) tablet 12.5 mg  12.5 mg Oral BID WC Mariel Aloe, MD   12.5 mg at 01/07/20 0840  . cefTRIAXone (ROCEPHIN) 2 g in sodium chloride 0.9 % 100 mL IVPB  2 g Intravenous Q24H Mariel Aloe, MD   Stopped at 01/06/20 1228  . Chlorhexidine Gluconate Cloth 2 % PADS 6 each  6 each Topical Daily Mariel Aloe, MD   6 each at 01/06/20 1701  . DULoxetine (CYMBALTA) DR capsule 20 mg  20 mg Oral Daily Mariel Aloe, MD   20 mg at 01/07/20 0902  . famotidine (PEPCID) tablet 20 mg  20 mg Oral QHS Mariel Aloe, MD   20 mg at 01/06/20 2055  . feeding supplement (ENSURE ENLIVE) (ENSURE ENLIVE) liquid 237 mL  237 mL Oral Q24H Mariel Aloe, MD   Stopped at 01/06/20 [phone]  . feeding supplement (PRO-STAT SUGAR FREE 64) liquid 30 mL  30 mL Oral BID Mariel Aloe, MD   30 mL at 01/06/20 2055  . fentaNYL (SUBLIMAZE) 100 MCG/2ML injection           . fentaNYL (SUBLIMAZE) injection 50 mcg  50 mcg Intravenous Q3H PRN Mariel Aloe, MD   50 mcg at 01/06/20 1734  . fluticasone (FLONASE) 50 MCG/ACT nasal spray 2 spray  2 spray Each Nare Daily Mariel Aloe, MD   2 spray at 01/03/20 1152  . heparin injection 5,000 Units  5,000 Units Subcutaneous Q8H Mariel Aloe, MD   5,000 Units at 01/07/20 0544  . hydrALAZINE (APRESOLINE) injection 10 mg  10 mg Intravenous Q4H PRN Mariel Aloe, MD   10 mg at 01/05/20 2207  . insulin aspart (novoLOG) injection 2-6 Units  2-6 Units Subcutaneous Q4H Mariel Aloe, MD   2 Units at 01/07/20 0037  . lidocaine (XYLOCAINE) 1 % (with pres) injection           . MEDLINE mouth rinse  15 mL Mouth Rinse BID Mariel Aloe, MD   15 mL at 01/06/20 2056  . midazolam (VERSED) 2 MG/2ML injection           . multivitamin with minerals tablet 1 tablet  1 tablet Oral Daily Mariel Aloe, MD    Stopped at 01/06/20 [phone]  . nutrition supplement (JUVEN) (  JUVEN) powder packet 1 packet  1 packet Oral BID BM Mariel Aloe, MD   1 packet at 01/06/20 1742  . oxyCODONE-acetaminophen (PERCOCET/ROXICET) 5-325 MG per tablet 1 tablet  1 tablet Oral Q4H PRN Mariel Aloe, MD   1 tablet at 01/07/20 0856  . polyethylene glycol (MIRALAX / GLYCOLAX) packet 17 g  17 g Oral Daily Mariel Aloe, MD   Stopped at 01/04/20 1046  . polyethylene glycol (MIRALAX / GLYCOLAX) packet 17 g  17 g Oral Daily PRN Amin, Ankit Chirag, MD      . potassium chloride SA (KLOR-CON) CR tablet 20 mEq  20 mEq Oral Daily Mariel Aloe, MD   Stopped at 01/06/20 [phone]  . predniSONE (DELTASONE) tablet 5 mg  5 mg Oral Q breakfast Mariel Aloe, MD   5 mg at 01/07/20 0859  . pregabalin (LYRICA) capsule 100 mg  100 mg Oral BID Mariel Aloe, MD   100 mg at 01/07/20 0859  . senna-docusate (Senokot-S) tablet 2 tablet  2 tablet Oral QHS PRN Amin, Ankit Chirag, MD      . sodium chloride flush (NS) 0.9 % injection 10-40 mL  10-40 mL Intracatheter Q12H Mariel Aloe, MD   10 mL at 01/07/20 0905  . sodium chloride flush (NS) 0.9 % injection 10-40 mL  10-40 mL Intracatheter PRN Mariel Aloe, MD      . sodium chloride flush (NS) 0.9 % injection 5 mL  5 mL Intravenous Q8H Mariel Aloe, MD   5 mL at 01/06/20 2056    Review of Systems  Constitutional: Positive for malaise/fatigue.  Respiratory: Positive for cough and shortness of breath.   Musculoskeletal: Positive for back pain and myalgias.  Psychiatric/Behavioral: Positive for depression.    PHYSICAL EXAMINATION: BP 119/71 (BP Location: Left Wrist)   Pulse 80   Temp 98.9 F (37.2 C) (Oral)   Resp 16   Ht 5\' 6"  (1.676 m)   Wt 106.3 kg   SpO2 91%   BMI 37.82 kg/m   Physical Exam  Constitutional: He is well-developed, well-nourished, and in no distress. No distress.  HENT:  Head: Normocephalic.  Eyes: Conjunctivae are normal. No scleral icterus.  Cardiovascular:  Normal rate.  Pulmonary/Chest: Effort normal. No respiratory distress.  Decreased breath sounds on the right.  Chest tube in place with serous drainage.  Skin: Skin is warm and dry. He is not diaphoretic.     Diagnostic Studies & Laboratory data:     Recent Radiology Findings:   CT ABDOMEN PELVIS WO CONTRAST  Result Date: 12/28/2019 CLINICAL DATA:  Abdominal pain. EXAM: CT ABDOMEN AND PELVIS WITHOUT CONTRAST TECHNIQUE: Multidetector CT imaging of the abdomen and pelvis was performed following the standard protocol without IV contrast. COMPARISON:  Dec 15, 2019 FINDINGS: Lower chest: Mild atelectasis is seen within the posterior aspect of the bilateral lung bases. There are small bilateral pleural effusions. Hepatobiliary: Stable 8 mm focus of parenchymal low attenuation is seen within the inferior medial aspect of the right lobe of the liver. No gallstones, gallbladder wall thickening, or biliary dilatation. Pancreas: Unremarkable. No pancreatic ductal dilatation or surrounding inflammatory changes. Spleen: Normal in size without focal abnormality. Adrenals/Urinary Tract: Adrenal glands are unremarkable. The kidneys are normal in size. A stable 3.9 cm x 2.9 cm exophytic isodense area is seen along the posterior aspect of the upper pole of the right kidney. A moderate to marked amount of surrounding inflammatory fat stranding is seen which is  increased in severity when compared to the prior study. A stable 2.2 cm exophytic cyst is seen along the anterior aspect of the mid left kidney. Bilateral 2 mm and 3 mm nonobstructing renal stones are seen. Bladder is unremarkable. Stomach/Bowel: Stomach is within normal limits. The appendix is not identified. Surgically anastomosed bowel is seen along the proximal portion of the transverse colon. No evidence of bowel dilatation. Noninflamed diverticula are seen within the descending and sigmoid colon. Vascular/Lymphatic: Moderate severity aortic calcification. No  enlarged abdominal or pelvic lymph nodes. Reproductive: Prostate is unremarkable. Other: There is a 4.6 cm x 4.1 cm fat containing right inguinal hernia. A 3.6 cm x 2.7 cm fat containing left inguinal hernia is also seen. Musculoskeletal: No acute or significant osseous findings. IMPRESSION: 1. Small bilateral pleural effusions with mild bibasilar atelectasis. 2. Stable 3.9 cm x 2.9 cm exophytic isodense area along the posterior aspect of the upper pole of the right kidney with moderate to marked amount of surrounding inflammatory fat stranding. This likely represents a hemorrhagic cyst, with associated right-sided pyelonephritis. 3. Stable left renal cyst. 4. Bilateral nonobstructing renal stones. 5. Colonic diverticulosis. 6. Bilateral fat-containing inguinal hernias. Aortic Atherosclerosis (ICD10-I70.0). Electronically Signed   By: Virgina Norfolk M.D.   On: 12/28/2019 16:50   CT ABDOMEN PELVIS WO CONTRAST  Result Date: 12/09/2019 CLINICAL DATA:  Sepsis, abdominal pain tenderness severe acute kidney injury EXAM: CT ABDOMEN AND PELVIS WITHOUT CONTRAST TECHNIQUE: Multidetector CT imaging of the abdomen and pelvis was performed following the standard protocol without IV contrast. COMPARISON:  MRI abdomen August 18, 2019 FINDINGS: Lower chest: There is moderate cardiomegaly. Trace pericardial effusion/thickening is seen. Streaky atelectasis seen at both lung bases. There is a small hiatal hernia. Hepatobiliary: The liver is normal in density without focal abnormality.The main portal vein is patent. No evidence of calcified gallstones, gallbladder wall thickening or biliary dilatation. Pancreas: Unremarkable. No pancreatic ductal dilatation or surrounding inflammatory changes. Spleen: Normal in size without focal abnormality. Adrenals/Urinary Tract: Both adrenal glands appear normal. Again noted are several partially exophytic lesions within both kidneys. The largest which is slightly hyperdense within the upper  pole of the right kidney measuring 3.8 cm most recently found to be a renal cyst. There scattered bilateral renal calculi present the largest measuring 6 mm in the lower pole of the left kidney. There is mild to moderate right-sided perinephric stranding, predominantly around the lower pole. No hydronephrosis however is seen. No ureteral or bladder calculi are noted. Stomach/Bowel: The stomach, small bowel, and colon are normal in appearance. No inflammatory changes, wall thickening, or obstructive findings.Scattered colonic diverticula are noted without diverticulitis. Vascular/Lymphatic: There are no enlarged mesenteric, retroperitoneal, or pelvic lymph nodes. Scattered aortic atherosclerotic calcifications are seen without aneurysmal dilatation. Reproductive: The prostate is unremarkable. Other: Bilateral fat containing inguinal hernias are present. Musculoskeletal: No acute or significant osseous findings. IMPRESSION: Mild-to-moderate right-sided perinephric fat stranding changes which could be due to infectious /inflammatory process or recently passed stone. Bilateral nonobstructing renal calculi. Diverticulosis without diverticulitis. Aortic Atherosclerosis (ICD10-I70.0). Electronically Signed   By: Prudencio Pair M.D.   On: 12/09/2019 03:34   DG Chest 1 View  Result Date: 01/05/2020 CLINICAL DATA:  Status post right thoracentesis. EXAM: CHEST  1 VIEW COMPARISON:  Jan 04, 2020. FINDINGS: Stable cardiomediastinal silhouette. No pneumothorax is noted. Left lung is clear. Mild to moderate right pleural effusion is noted with associated right basilar atelectasis or infiltrate. Bony thorax is unremarkable. IMPRESSION: Mild to moderate right pleural effusion  is noted with associated right basilar atelectasis or infiltrate. No pneumothorax is noted. Electronically Signed   By: Marijo Conception M.D.   On: 01/05/2020 16:12   DG Chest 1 View  Result Date: 12/29/2019 CLINICAL DATA:  Shortness of breath EXAM: CHEST   1 VIEW COMPARISON:  Dec 27, 2019 FINDINGS: There is mild atelectatic change in the right lower lung region. The lungs elsewhere are clear. There is cardiomegaly with pulmonary vascularity normal. No adenopathy. No bone lesions. IMPRESSION: Mild right lower lung region atelectasis. Lungs elsewhere clear. Stable cardiomegaly. Electronically Signed   By: Lowella Grip III M.D.   On: 12/29/2019 13:42   DG Abd 1 View  Result Date: 01/01/2020 CLINICAL DATA:  Orogastric tube placement. EXAM: ABDOMEN - 1 VIEW COMPARISON:  Dec 10, 2019 FINDINGS: Enteric catheter follows the expected location of the gastric body terminating in the area of the antrum. Nonobstructive bowel gas pattern. Pigtail catheter overlies the right upper quadrant of the abdomen. Left renal calculus is again noted. IMPRESSION: Enteric catheter follows the expected location of the gastric body terminating in the area of the antrum. Electronically Signed   By: Fidela Salisbury M.D.   On: 01/01/2020 19:05   CT Head Wo Contrast  Result Date: 12/27/2019 CLINICAL DATA:  Altered mental status. EXAM: CT HEAD WITHOUT CONTRAST TECHNIQUE: Contiguous axial images were obtained from the base of the skull through the vertex without intravenous contrast. COMPARISON:  Head CT 12/09/2019 FINDINGS: Brain: The ventricles are normal in size and configuration. No extra-axial fluid collections are identified. The gray-white differentiation is maintained. No CT findings for acute hemispheric infarction or intracranial hemorrhage. No mass lesions. The brainstem and cerebellum are normal. Vascular: Vascular calcifications, advanced for age but no aneurysm or hyperdense vessels. Skull: No acute skull fracture. No bone lesion. Sinuses/Orbits: Stable severe chronic left maxillary and left half sphenoid sinus disease with partially calcified inspissated debris also extending into the left nasal cavity. Sinus wall thickening noted. The other paranasal sinuses and mastoid  air cells are clear. The globes are intact. Other: No scalp lesions, laceration or hematoma. IMPRESSION: 1. No acute intracranial findings or mass lesions. 2. Stable severe chronic left maxillary and left half sphenoid sinus disease. Electronically Signed   By: Marijo Sanes M.D.   On: 12/27/2019 07:05   CT HEAD WO CONTRAST  Result Date: 12/09/2019 CLINICAL DATA:  Altered mental status. EXAM: CT HEAD WITHOUT CONTRAST TECHNIQUE: Contiguous axial images were obtained from the base of the skull through the vertex without intravenous contrast. COMPARISON:  May 17, 2019 FINDINGS: Brain: No evidence of acute infarction, hemorrhage, hydrocephalus, extra-axial collection or mass lesion/mass effect. Vascular: No hyperdense vessel or unexpected calcification. Skull: Normal. Negative for fracture or focal lesion. Sinuses/Orbits: There is stable marked severity left maxillary sinus, left ethmoid sinus and sphenoid sinus mucosal thickening. Stable hyperdense secretions are again seen. A chronic deformity of the medial wall of the left maxillary sinus is also seen. Other: None. IMPRESSION: 1. No acute intracranial abnormality. 2. Stable left maxillary sinus, left ethmoid sinus and sphenoid sinus disease. Electronically Signed   By: Virgina Norfolk M.D.   On: 12/09/2019 03:16   CT CHEST WO CONTRAST  Result Date: 01/04/2020 CLINICAL DATA:  Pleural effusion EXAM: CT CHEST WITHOUT CONTRAST TECHNIQUE: Multidetector CT imaging of the chest was performed following the standard protocol without IV contrast. COMPARISON:  Chest radiograph Dec 25, 2019; chest CT May 16, 2020. FINDINGS: Cardiovascular: There is no evident thoracic aortic aneurysm. There  are scattered foci of calcification in visualized great vessels. There are foci of aortic atherosclerosis. There also multiple foci of coronary artery calcification. There is a fairly small pericardial effusion. The pericardium does not appear thickened. Mediastinum/Nodes:  Thyroid appears unremarkable. There are multiple subcentimeter mediastinal lymph nodes. There is no frank adenopathy in the thoracic region by size criteria no esophageal lesions are appreciable. Lungs/Pleura: There is a moderate pleural effusion on the right which has both free-flowing and loculated components. There is consolidation throughout much of the right lower lobe. There is atelectatic change in the left lower lobe as well as mild consolidation in the medial segment of the left lower lobe. Minimal left pleural effusion evident. Upper Abdomen: Visualized upper abdominal structures appear unremarkable. Musculoskeletal: There is multilevel degenerative change in the thoracic spine. There are no appreciable blastic or lytic bone lesions. No chest wall lesions are evident. IMPRESSION: 1. Moderate pleural effusion on the right with both free-flowing and loculated components. Consolidation is noted throughout most of the right lower lobe, likely a combination of compressive atelectasis and pneumonia. 2. Focal infiltrate with atelectasis medial segment left lower lobe. Atelectasis more posteriorly in the left lower lobe with minimal left pleural effusion. 3.  Fairly small pericardial effusion noted. 4. Aortic atherosclerosis and foci of coronary artery calcification noted. 5. Multiple subcentimeter mediastinal lymph nodes without frank adenopathy by size criteria. Aortic Atherosclerosis (ICD10-I70.0). Electronically Signed   By: Lowella Grip III M.D.   On: 01/04/2020 12:20   MR BRAIN WO CONTRAST  Result Date: 12/30/2019 CLINICAL DATA:  Encephalopathy.  Sepsis. EXAM: MRI HEAD WITHOUT CONTRAST TECHNIQUE: Multiplanar, multiecho pulse sequences of the brain and surrounding structures were obtained without intravenous contrast. COMPARISON:  Head CT 12/27/2019 FINDINGS: Brain: Diffusion imaging does not show any acute or subacute infarction or other cause of restricted diffusion. No focal abnormality affects  the brainstem or cerebellum. Cerebral hemispheres show minimal small vessel change of the white matter. No cortical or large vessel territory infarction. No mass lesion, hemorrhage, hydrocephalus or extra-axial collection. Vascular: Major vessels at the base of the brain show flow. Skull and upper cervical spine: Negative Sinuses/Orbits: Opacification of the left maxillary sinus, the sphenoid sinus and some of the left ethmoid sinuses. Internal material is hypointense on T2 imaging. This could be chronic inspissated mucus or chronic fungal sinusitis. Other: None IMPRESSION: No significant intracranial finding. No acute infarction or other acute process. Sinus disease primarily affecting the left maxillary and sphenoid sinus. The sinuses could contain chronic inspissated mucus or could be affected by fungal sinusitis. Electronically Signed   By: Nelson Chimes M.D.   On: 12/30/2019 15:34   MR PELVIS W WO CONTRAST  Result Date: 01/03/2020 CLINICAL DATA:  Fever and sepsis. EXAM: MRI PELVIS WITHOUT AND WITH CONTRAST TECHNIQUE: Multiplanar multisequence MR imaging of the pelvis was performed both before and after administration of intravenous contrast. CONTRAST:  65mL GADAVIST GADOBUTROL 1 MMOL/ML IV SOLN COMPARISON:  12/28/2019 FINDINGS: Urinary Tract:  The urinary bladder appears normal. Bowel: No bowel wall thickening, inflammation or 4 distension. Sigmoid diverticula identified without evidence of acute inflammation. Vascular/Lymphatic: No pathologically enlarged lymph nodes. No significant vascular abnormality seen. Reproductive:  Prostate gland is unremarkable. Other: There is a trace amount of free fluid within the posterior pelvis, image 17/5. No fluid collections identified. Bilateral fat containing inguinal hernias are identified. Musculoskeletal: No suspicious bone lesions identified. IMPRESSION: 1. No acute findings within the pelvis. 2. Trace amount of free fluid within the posterior pelvis.  3. Bilateral  fat containing inguinal hernias. Electronically Signed   By: Kerby Moors M.D.   On: 01/03/2020 13:20   US PELVIS (TRANSABDOMINAL ONLY)  Result Date: 12/27/2019 CLINICAL DATA:  Prostatitis EXAM: ULTRASOUND OF THE MALE PELVIS COMPARISON:  None. FINDINGS: Bladder: Normal. Prevoid bladder volume 97 cc. Postvoid bladder volume not obtained as patient was sleeping during the exam. Prostate gland:  2.6 x 2.7 x 3.3 cm with a volume of 11.8 cc. Seminal vesicles:  Normal IMPRESSION: 1. Normal exam. Electronically Signed   By: Kerby Moors M.D.   On: 12/27/2019 09:47   US SCROTUM  Result Date: 01/02/2020 CLINICAL DATA:  Scrotal anomaly bilateral inguinal hernia EXAM: ULTRASOUND OF SCROTUM TECHNIQUE: Complete ultrasound examination of the testicles, epididymis, and other scrotal structures was performed. COMPARISON:  12/27/2019 FINDINGS: Right testicle Measurements: 3 x 1.6 x 2.7 cm. No mass or microlithiasis visualized. Left testicle Measurements: 3.4 x 1.9 by 3 cm. Heterogeneous echotexture without discrete mass. Right epididymis:  Heterogenous and slightly edematous.  No mass. Left epididymis: Heterogenous in appearance. Small cyst measuring 5 mm. Hydrocele:  Trace right greater than left hydrocele. Varicocele:  None visualized. Echogenic tissue within the bilateral inguinal regions presumably due to fat containing inguinal hernias seen on CT. IMPRESSION: 1. Overall heterogeneous echotexture of the left testis but without well-defined fluid collection within either testis to suggest abscess. 2. Trace bilateral hydroceles 3. Soft tissue echogenicity within the inguinal regions bilaterally, likely corresponding to fat filled inguinal hernias on CT. Scrotal anomaly please pick correct template. Electronically Signed   By: Donavan Foil M.D.   On: 01/02/2020 23:35   CT Abdomen Pelvis W Contrast  Result Date: 12/15/2019 CLINICAL DATA:  Abdominal abscess/infection. Weakness and hypoglycemia. Recent kidney  infection. EXAM: CT ABDOMEN AND PELVIS WITH CONTRAST TECHNIQUE: Multidetector CT imaging of the abdomen and pelvis was performed using the standard protocol following bolus administration of intravenous contrast. CONTRAST:  169mL OMNIPAQUE IOHEXOL 300 MG/ML  SOLN COMPARISON:  CT of the abdomen and pelvis on 12/09/2019 FINDINGS: Lower chest: There is bibasilar atelectasis. Heart is mildly enlarged. There is atherosclerotic calcification of coronary arteries. Hepatobiliary: There is minimally heterogeneous appearance of the LEFT hepatic lobe. No suspicious liver lesion. Gallbladder is present. Pancreas: Unremarkable. No pancreatic ductal dilatation or surrounding inflammatory changes. Spleen: Normal in size without focal abnormality. Adrenals/Urinary Tract: Adrenal glands are normal in appearance. Isodense 3.9 centimeter mass adjacent to the UPPER pole the RIGHT kidney appears LOWER in attenuation compared with most recent exam and is consistent with benign cyst based on previous MRI evaluation. Smaller cysts are identified in the kidneys bilaterally. Numerous nonobstructing intrarenal calculi bilaterally. Ureters are unremarkable. There is similar perinephric stranding posterior to the RIGHT kidney. Urinary bladder is normal in appearance. Stomach/Bowel: Stomach and small bowel loops are normal in appearance. Previous ascending colectomy. Normal appearance of the anastomosis in the RIGHT UPPER QUADRANT. Numerous colonic diverticula are present. No acute diverticulitis. Vascular/Lymphatic: There is atherosclerotic calcification of the abdominal aorta. No associated aneurysm. Small retroperitoneal lymph nodes are identified, measuring on the order of 8 millimeters and smaller. No suspicious lymphadenopathy. Although involved by atherosclerosis, there is vascular opacification of the celiac axis, superior mesenteric artery, and inferior mesenteric artery. Normal appearance of the portal venous system and inferior vena  cava. Reproductive: Prostate is unremarkable. Other: No ascites. There is fat within the inguinal rings bilaterally. Musculoskeletal: Degenerative changes are present in the LOWER thoracic spine. IMPRESSION: 1. Stable appearance of RIGHT perinephric stranding, bilateral renal  cysts, and bilateral intrarenal calculi. 2. Heterogeneous appearance of the anterior LEFT hepatic lobe. There is no perihepatic fluid. Considerations include trauma and liver laceration, scarring, and differential perfusion. Correlation is recommended with history. 3. Previous ascending colectomy. Normal appearance of the anastomosis in the RIGHT UPPER QUADRANT. 4. Colonic diverticulosis. No acute diverticulitis. 5. Coronary artery disease. 6. Aortic Atherosclerosis (ICD10-I70.0). Electronically Signed   By: Nolon Nations M.D.   On: 12/15/2019 13:53   IR Guided Niel Hummer W Catheter Placement  Result Date: 01/07/2020 INDICATION: 64 year old with complex right pleural effusion. Request for chest tube placement. EXAM: IMAGE GUIDED RIGHT CHEST TUBE PLACEMENT MEDICATIONS: Moderate sedation ANESTHESIA/SEDATION: Fentanyl 50 mcg IV; Versed 1.0 mg IV Moderate Sedation Time:  18 minutes The patient was continuously monitored during the procedure by the interventional radiology nurse under my direct supervision. COMPLICATIONS: None immediate. PROCEDURE: Informed written consent was obtained from the patient's wife after a thorough discussion of the procedural risks, benefits and alternatives. All questions were addressed. Maximal Sterile Barrier Technique was utilized including caps, mask, sterile gowns, sterile gloves, sterile drape, hand hygiene and skin antiseptic. A timeout was performed prior to the initiation of the procedure. Patient was rolled on his left side. Right chest was evaluated with ultrasound. Adequate pleural pocket was identified with ultrasound in the right mid axillary region. The right side of the chest was prepped and draped in  sterile fashion. Skin was anesthetized with 1% lidocaine. Small incision was made. Using ultrasound guidance, a Yueh catheter was directed into the pleural space and yellow pleural fluid was aspirated. Superstiff Amplatz wire was advanced into the pleural space and the tract was dilated to accommodate a 14 [place] multipurpose drain. Catheter was sutured to skin and attached to a PleurEvac collecting system. Fluoroscopic and ultrasound images were taken and saved for documentation. FINDINGS: Right pleural fluid has some loculations. 72 French drain was placed within the right pleural collection. Yellow fluid was draining from the chest tube at the end of the procedure. IMPRESSION: Successful placement of right chest tube using image guidance. Electronically Signed   By: Markus Daft M.D.   On: 01/07/2020 11:35   DG CHEST PORT 1 VIEW  Result Date: 01/06/2020 CLINICAL DATA:  Respiratory failure.  Hypoxia. EXAM: PORTABLE CHEST 1 VIEW COMPARISON:  One-view chest x-ray 01/05/2020 FINDINGS: Heart size is exaggerated by low lung volumes. Right pleural effusion and basilar airspace disease is again seen. Moderate pulmonary vascular congestion is stable. IMPRESSION: 1. Stable right pleural effusion and basilar airspace disease. 2. Stable moderate pulmonary vascular congestion. Electronically Signed   By: San Morelle M.D.   On: 01/06/2020 07:46   DG CHEST PORT 1 VIEW  Result Date: 01/04/2020 CLINICAL DATA:  Respiratory failure. EXAM: PORTABLE CHEST 1 VIEW COMPARISON:  Radiographs 01/03/2020 and 01/02/2020. FINDINGS: 0450 hours. Interval extubation and removal of the enteric tube. Stable cardiomegaly, right pleural effusion and probable bibasilar atelectasis. The pulmonary vascularity appears improved without definite residual edema. There is no pneumothorax. The bones appear unchanged. IMPRESSION: Improved pulmonary vascular congestion following extubation. Stable cardiomegaly, right pleural effusion and  probable bibasilar atelectasis. Electronically Signed   By: Richardean Sale M.D.   On: 01/04/2020 09:06   DG CHEST PORT 1 VIEW  Result Date: 01/03/2020 CLINICAL DATA:  Respiratory failure EXAM: PORTABLE CHEST 1 VIEW COMPARISON:  Radiograph 01/02/2020, CT 05/17/2019 FINDINGS: Endotracheal tube terminates in the mid to upper trachea, 6 cm from the carina. Transesophageal tube tip terminates below the level of imaging, beyond the GE  junction. Telemetry leads and support devices overlie the chest. Gradient opacity and pleural thickening in the right lung base compatible with layering pleural effusion. Suspect a small left effusion as well with milder atelectatic changes in the left lower lung. Stable cardiomegaly with a calcified aorta. No acute osseous or soft tissue abnormality. Degenerative changes are present in the imaged spine and shoulders. IMPRESSION: 1. Endotracheal tube terminates in the mid to upper trachea, 6 cm from the carina. 2. Transesophageal tube tip terminates below the level of imaging, beyond the GE junction. 3. Stable appearance of the moderate right and trace left effusions with likely areas of adjacent atelectasis. Underlying consolidation is not excluded. 4. Stable cardiomegaly. Electronically Signed   By: Lovena Le M.D.   On: 01/03/2020 06:36   DG CHEST PORT 1 VIEW  Result Date: 01/02/2020 CLINICAL DATA:  Intubation EXAM: PORTABLE CHEST 1 VIEW COMPARISON:  Radiograph 01/01/2020, CT 05/17/2019 FINDINGS: Endotracheal tube in the mid trachea, 3.4 cm from the carina. Transesophageal tube tip is distal to the GE junction, terminating below the level of imaging. Telemetry leads overlie the chest. Gradient opacity and pleural thickening in the right lung base is suspicious for layering pleural effusion likely with adjacent atelectatic change though some underlying consolidation is not excluded. There is slight blunting of the left cardiophrenic sulcus as well which may reflect additional  trace pleural fluid. Further atelectatic changes also present in the left lung base. Stable cardiomegaly with a calcified, tortuous aorta. No acute osseous or soft tissue abnormality. IMPRESSION: 1. Gradient opacity and pleural thickening in the right lung base is suspicious for layering pleural effusion likely with adjacent atelectatic change though some underlying consolidation is not excluded. 2. Suspect trace left pleural fluid. 3. Stable cardiomegaly. 4.  Aortic Atherosclerosis (ICD10-I70.0). Electronically Signed   By: Lovena Le M.D.   On: 01/02/2020 05:46   DG Chest Port 1 View  Result Date: 01/01/2020 CLINICAL DATA:  Repositioning of ET tube. EXAM: PORTABLE CHEST 1 VIEW COMPARISON:  Jan 01, 2020 FINDINGS: The ETT terminates in the mid trachea in good position. The NG tube terminates below today's film. No pneumothorax. Mild opacity remains in the right base. No other interval changes. IMPRESSION: 1. The ETT is in good position. The NG tube terminates below today's film. 2. Linear opacity in the right base is likely either fluid in a fissure or atelectasis. Mild haziness over the right base is favored represent atelectasis rather than infiltrate given the waxing and waning appearance over the last couple of chest x-rays. Recommend attention on follow-up. Electronically Signed   By: Dorise Bullion III M.D   On: 01/01/2020 20:23   Portable Chest x-ray  Result Date: 01/01/2020 CLINICAL DATA:  ETT placement.  OG tube placement. EXAM: PORTABLE CHEST 1 VIEW COMPARISON:  Jan 01, 2020 FINDINGS: The ETT terminates 1 cm above the carina. Recommend withdrawing 2 cm. No pneumothorax. The OG tube terminates below today's film. Mild opacity in the right base favored represent atelectasis. No other interval changes. IMPRESSION: 1. The ETT terminates 1 cm above the carina. Recommend withdrawing 2 cm. 2. The OG tube terminates below today's film. 3. Mild atelectasis in the right base. These results will be called  to the ordering clinician or representative by the Radiologist Assistant, and communication documented in the PACS or [employer]. Electronically Signed   By: Dorise Bullion III M.D   On: 01/01/2020 19:05   DG CHEST PORT 1 VIEW  Result Date: 01/01/2020 CLINICAL DATA:  Hypoxemia. EXAM: PORTABLE CHEST 1 VIEW COMPARISON:  Jan 01, 2020 FINDINGS: Stable cardiomegaly. The hila and mediastinum are normal. No pneumothorax. Mild opacity in the right base is new in the interval. No other acute abnormalities. IMPRESSION: Mild opacity in the right base may represent atelectasis or developing infiltrate. Recommend short-term follow-up imaging to ensure resolution. No other abnormalities. Electronically Signed   By: Dorise Bullion III M.D   On: 01/01/2020 18:25   DG CHEST PORT 1 VIEW  Result Date: 01/01/2020 CLINICAL DATA:  Acute respiratory failure. EXAM: PORTABLE CHEST 1 VIEW COMPARISON:  Dec 31, 2019 FINDINGS: Mild, stable diffusely increased lung markings are seen. Very mild right infrahilar atelectasis is noted. There is no evidence of a pleural effusion or pneumothorax. The cardiac silhouette is moderately enlarged and unchanged in size. The visualized skeletal structures are unremarkable. IMPRESSION: 1. Very mild right infrahilar atelectasis. 2. Stable cardiomegaly. 3. Stable diffusely increased lung markings which may represent mild pulmonary edema. Electronically Signed   By: Virgina Norfolk M.D.   On: 01/01/2020 15:02   DG CHEST PORT 1 VIEW  Result Date: 12/31/2019 CLINICAL DATA:  64 year old male with a history of shortness of breath EXAM: PORTABLE CHEST 1 VIEW COMPARISON:  12/29/2019 FINDINGS: Cardiomediastinal silhouette unchanged with cardiomegaly. Interlobular septal thickening. Mild patchy airspace opacity in the lower lungs slightly increased from the comparison. No pneumothorax or pleural effusion. IMPRESSION: Evidence of early pulmonary edema and basilar atelectasis. Electronically Signed    By: Corrie Mckusick D.O.   On: 12/31/2019 09:43   DG Chest Port 1 View  Result Date: 12/27/2019 CLINICAL DATA:  Weakness and lethargy EXAM: PORTABLE CHEST 1 VIEW COMPARISON:  12/15/2019 FINDINGS: Stable borderline heart size and mildly tortuous aortic contours. Mild interstitial crowding at the bases. There is no edema, consolidation, effusion, or pneumothorax. IMPRESSION: No acute finding. Electronically Signed   By: Monte Fantasia M.D.   On: 12/27/2019 05:31   DG Chest Port 1 View  Result Date: 12/15/2019 CLINICAL DATA:  Pt arrives POV from home with complaints of weakness and hypoglycemia. Pts CBG upon arrival 25. Pt provided with orange juice. Pt reports he was just dc'ed from here on Tuesday for a kidney infection. hx of CHF, AFIB, and DM. EXAM: PORTABLE CHEST 1 VIEW COMPARISON:  12/08/2019 FINDINGS: Cardiac silhouette is normal in size. No mediastinal or hilar masses. No evidence of adenopathy. Clear lungs.  No pleural effusion or pneumothorax. Skeletal structures are grossly intact. IMPRESSION: No active disease. Electronically Signed   By: Lajean Manes M.D.   On: 12/15/2019 12:18   DG Chest Port 1 View  Result Date: 12/08/2019 CLINICAL DATA:  Altered mental status, minimally responsive since Sunday. EXAM: PORTABLE CHEST 1 VIEW COMPARISON:  September 05, 2018 FINDINGS: There is no evidence of acute infiltrate, pleural effusion or pneumothorax. The heart size and mediastinal contours are within normal limits. Mild to moderate severity degenerative changes are again seen within the mid to lower thoracic spine. IMPRESSION: No active disease. Electronically Signed   By: Virgina Norfolk M.D.   On: 12/08/2019 21:22   DG Knee Right Port  Result Date: 12/27/2019 CLINICAL DATA:  [place] 1 week ago. Persistent knee pain. EXAM: PORTABLE RIGHT KNEE - 1-2 VIEW COMPARISON:  None. FINDINGS: Mild tricompartmental degenerative changes but no acute bony findings or osteochondral lesion. No joint effusion. Age  advanced vascular calcifications are noted. IMPRESSION: Mild degenerative changes but no acute bony findings or joint effusion. Age advanced vascular calcifications. Electronically Signed   By:  Marijo Sanes M.D.   On: 12/27/2019 07:06   DG Abd Portable 1V  Result Date: 12/10/2019 CLINICAL DATA:  Lower abdominal pain. Lethargy. EXAM: PORTABLE ABDOMEN - 1 VIEW COMPARISON:  12/09/2019 CT abdomen/pelvis FINDINGS: No disproportionately dilated small bowel loops. Mild colonic gas and stool. No evidence of pneumatosis or pneumoperitoneum. There is a 3 mm left mid renal stone. Mild lumbar spondylosis. Calcified venous phleboliths in the deep pelvis. IMPRESSION: 1. Nonobstructive bowel gas pattern. 2. Left nephrolithiasis. Electronically Signed   By: Ilona Sorrel M.D.   On: 12/10/2019 10:20   EEG adult  Result Date: 01/02/2020 Lora Havens, MD     01/02/2020  9:22 AM Patient Name: Marco [name] MRN: [id number] Epilepsy Attending: Lora Havens Referring Physician/Provider: Dr. Shelly Coss Date: 01/02/2020 Duration: 25.52 mins Patient history: 64 year old male with altered mental status.  EEG evaluate for seizures. Level of alertness: Comatose/sedated AEDs during EEG study: Propofol Technical aspects: This EEG study was done with scalp electrodes positioned according to the 10-20 International system of electrode placement. Electrical activity was acquired at a sampling rate of 500Hz  and reviewed with a high frequency filter of 70Hz  and a low frequency filter of 1Hz . EEG data were recorded continuously and digitally stored. Description: EEG showed continuous generalized polymorphic 3 to 6 Hz theta-delta slowing.  EEG was reactive to tactile stimulation. Hyperventilation and photic stimulation were not performed.   ABNORMALITY -Continuous slow, generalized IMPRESSION: This study is suggestive of severe diffuse encephalopathy, nonspecific etiology but could be related to sedation. No seizures or epileptiform  discharges were seen throughout the recording. Lora Havens   ECHOCARDIOGRAM COMPLETE  Result Date: 12/31/2019    ECHOCARDIOGRAM REPORT   Patient Name:   Marco [name] Date of Exam: 12/31/2019 Medical Rec #:  [id number]     Height:       66.0 in Accession #:    [id number]    Weight:       238.8 lb Date of Birth:  07/25/1956     BSA:          2.156 m Patient Age:    54 years      BP:           180/78 mmHg Patient Gender: M             HR:           95 bpm. Exam Location:  Inpatient Procedure: 2D Echo Indications:    fever 780.6  History:        Patient has prior history of Echocardiogram examinations. CHF,                 CAD, Arrythmias:Atrial Fibrillation; Risk Factors:Diabetes,                 Hypertension and Former Smoker.  Sonographer:    Jannett Celestine RDCS (AE) Referring Phys: [id number] [facility]  Sonographer Comments: Image acquisition challenging due to respiratory motion. restricted mobility. patient on BiPap machine during exam IMPRESSIONS  1. Mild hypokinesis of the distal septum and apex. Left ventricular ejection fraction, by estimation, is 50 to 55%. The left ventricle has low normal function. The left ventricle demonstrates regional wall motion abnormalities (see scoring diagram/findings for description). There is mild concentric left ventricular hypertrophy. Left ventricular diastolic parameters are consistent with Grade I diastolic dysfunction (impaired relaxation).  2. Right ventricular systolic function is normal. The right ventricular size is normal.  3. Right atrial size was mildly dilated.  4.  The mitral valve is normal in structure. No evidence of mitral valve regurgitation. No evidence of mitral stenosis.  5. The aortic valve is tricuspid. Aortic valve regurgitation is not visualized. No aortic stenosis is present.  6. The inferior vena cava is normal in size with greater than 50% respiratory variability, suggesting right atrial pressure of 3 mmHg. FINDINGS  Left Ventricle: Mild  hypokinesis of the distal septum and apex. Left ventricular ejection fraction, by estimation, is 50 to 55%. The left ventricle has low normal function. The left ventricle demonstrates regional wall motion abnormalities. The left ventricular internal cavity size was normal in size. There is mild concentric left ventricular hypertrophy. Left ventricular diastolic parameters are consistent with Grade I diastolic dysfunction (impaired relaxation). Indeterminate filling pressures. Right Ventricle: The right ventricular size is normal. No increase in right ventricular wall thickness. Right ventricular systolic function is normal. Left Atrium: Left atrial size was normal in size. Right Atrium: Right atrial size was mildly dilated. Pericardium: There is no evidence of pericardial effusion. Mitral Valve: The mitral valve is normal in structure. Normal mobility of the mitral valve leaflets. No evidence of mitral valve regurgitation. No evidence of mitral valve stenosis. Tricuspid Valve: The tricuspid valve is normal in structure. Tricuspid valve regurgitation is trivial. No evidence of tricuspid stenosis. Aortic Valve: The aortic valve is tricuspid. Aortic valve regurgitation is not visualized. No aortic stenosis is present. Pulmonic Valve: The pulmonic valve was normal in structure. Pulmonic valve regurgitation is not visualized. No evidence of pulmonic stenosis. Aorta: The aortic root is normal in size and structure. Venous: The inferior vena cava is normal in size with greater than 50% respiratory variability, suggesting right atrial pressure of 3 mmHg. IAS/Shunts: No atrial level shunt detected by color flow Doppler.  LEFT VENTRICLE PLAX 2D LVIDd:         5.30 cm  Diastology LVIDs:         3.70 cm  LV e' lateral:   6.20 cm/s LV PW:         1.20 cm  LV E/e' lateral: 12.3 LV IVS:        1.20 cm  LV e' medial:    5.77 cm/s LVOT diam:     2.20 cm  LV E/e' medial:  13.2 LV SV:         64 LV SV Index:   30 LVOT Area:     3.80  cm  RIGHT VENTRICLE RV S prime:     15.30 cm/s TAPSE (M-mode): 2.0 cm LEFT ATRIUM             Index       RIGHT ATRIUM           Index LA diam:        3.20 cm 1.48 cm/m  RA Area:     23.10 cm LA Vol (A2C):   49.6 ml 23.00 ml/m RA Volume:   70.00 ml  32.47 ml/m LA Vol (A4C):   45.3 ml 21.01 ml/m LA Biplane Vol: 51.4 ml 23.84 ml/m  AORTIC VALVE LVOT Vmax:   112.00 cm/s LVOT Vmean:  83.800 cm/s LVOT VTI:    0.168 m  AORTA Ao Root diam: 3.20 cm MITRAL VALVE               TRICUSPID VALVE MV Area (PHT): 3.77 cm    TR Peak grad:   28.3 mmHg MV Decel Time: 201 msec    TR Vmax:  266.00 cm/s MV E velocity: 76.30 cm/s MV A velocity: 87.80 cm/s  SHUNTS MV E/A ratio:  0.87        Systemic VTI:  0.17 m                            Systemic Diam: 2.20 cm Skeet Latch MD Electronically signed by Skeet Latch MD Signature Date/Time: 12/31/2019/12:49:48 PM    Final    CT IMAGE GUIDED DRAINAGE BY PERCUTANEOUS CATHETER  Result Date: 01/01/2020 INDICATION: 64 year old with inflammation around a right renal cyst. Concern for an infected right renal cyst. Request for percutaneous drainage. EXAM: CT-GUIDED DRAINAGE OF RIGHT RENAL CYST MEDICATIONS: Moderate sedation ANESTHESIA/SEDATION: 1.0 mg IV Versed 25 mcg IV Fentanyl Moderate Sedation Time:  88 minutes The patient was continuously monitored during the procedure by the interventional radiology nurse under my direct supervision. COMPLICATIONS: None immediate. TECHNIQUE: Informed consent was obtained from the patient's family after a thorough discussion of the procedural risks, benefits and alternatives. All questions were addressed. Maximal Sterile Barrier Technique was utilized including caps, mask, sterile gowns, sterile gloves, sterile drape, hand hygiene and skin antiseptic. A timeout was performed prior to the initiation of the procedure. PROCEDURE: Patient was placed prone on the CT scanner. Images through the abdomen were obtained. The hyperdense cyst along  the posterior aspect of the right kidney was targeted. The right flank was prepped with chlorhexidine and sterile field was created. Skin and soft tissues were anesthetized with 1% lidocaine. Small incision was made. Using CT guidance, an 18 gauge trocar needle was initially directed into the fluid around the cyst. Thick bloody fluid was aspirated. Needle was eventually directed into the hyperdense cyst and additional dark bloody fluid was aspirated. Superstiff Amplatz wire was advanced into the collection and tract was dilated to accommodate a 10 [place] multipurpose drain. However, the follow-up CT images demonstrated that do drain did not stay within the cystic collection but rather went cephalad to the kidney. Pulled the drain back but the side holes were outside of the abdomen. As a result, this drain was completely removed. Therefore, a 18 gauge trocar needle was directed back into the renal cyst using a combination of ultrasound and CT guidance. Wire position was confirmed within the cyst and the tract was dilated to accommodate a 10 French Bettey Mare catheter. This catheter was felt to be within this cyst and additional bloody fluid was removed. Approximately 30 mL of bloody fluid was removed throughout the procedure. Catheter was attached to a suction bulb and sutured to the skin. Dressing was placed. FINDINGS: Again noted is a hyperdense exophytic structure suggestive for hemorrhagic cyst in the posterior right kidney upper pole. Inflammatory changes and fluid around the cyst are similar to the recent CT from 12/28/2019. It was difficult to place a drain within this cyst due to the small size and configuration. Eventually, 10 French Bettey Mare catheter was placed within the cyst and a total of 30 mL of bloody fluid was removed from the cyst and surrounding tissues. IMPRESSION: CT-guided placement of a drainage catheter within the hemorrhagic right renal cyst. Fluid was sent for culture.  Electronically Signed   By: Markus Daft M.D.   On: 01/01/2020 16:39   US Abdomen Limited RUQ  Result Date: 01/05/2020 CLINICAL DATA:  Elevated LFTs EXAM: ULTRASOUND ABDOMEN LIMITED RIGHT UPPER QUADRANT COMPARISON:  None. FINDINGS: Gallbladder: No gallstones or wall thickening visualized. No sonographic Murphy sign noted by  sonographer. Common bile duct: Diameter: 3 mm Liver: Slight increased echogenicity and heterogeneity is noted without focal mass. No biliary ductal dilatation is noted. Portal vein is patent on color Doppler imaging with normal direction of blood flow towards the liver. Other: None. IMPRESSION: Mild increased echogenicity likely related to fatty infiltration. Electronically Signed   By: Inez Catalina M.D.   On: 01/05/2020 16:39   US THORACENTESIS ASP PLEURAL SPACE W/IMG GUIDE  Result Date: 01/05/2020 INDICATION: Recent extubation. Please perform ultrasound-guided right-sided thoracentesis for diagnostic and therapeutic purposes. EXAM: US THORACENTESIS ASP PLEURAL SPACE W/IMG GUIDE COMPARISON:  Chest CT-01/04/2020; chest radiograph-01/04/2020 MEDICATIONS: None. COMPLICATIONS: None immediate. TECHNIQUE: Informed written consent was obtained from the patient after a discussion of the risks, benefits and alternatives to treatment. A timeout was performed prior to the initiation of the procedure. With the patient positioned right lateral decubitus in his hospital bed, ultrasound scanning was performed of the right hemithorax demonstrating a small complex right-sided pleural effusion, similar to the findings seen on preceding chest CT performed 12/15/2019. As such, the inferior, posterior, lateral aspect of the right hemithorax was prepped and draped in the usual sterile fashion. 1% lidocaine was used for local anesthesia. Under direct ultrasound guidance, a 19 gauge, 7-cm, Yueh catheter was introduced. An ultrasound image was saved for documentation purposes. The thoracentesis was performed,  however despite mobilization of the Yueh catheter, only approximately 25 cc of serous fluid was able to be aspirated. The catheter was removed and a dressing was applied. The patient tolerated the procedure well without immediate post procedural complication. The patient was escorted to have an upright chest radiograph. FINDINGS: Small complex right-sided pleural effusion, similar to the findings on preceding chest CT. IMPRESSION: Successful ultrasound-guided right sided thoracentesis yielding only 25 cc of serous pleural fluid. Electronically Signed   By: Sandi Mariscal M.D.   On: 01/05/2020 16:31       I have independently reviewed the above radiology studies  and reviewed the findings with the patient.   Recent Lab Findings: Lab Results  Component Value Date   WBC 13.4 (H) 01/06/2020   HGB 8.0 (L) 01/06/2020   HCT 25.7 (L) 01/06/2020   PLT 454 (H) 01/06/2020   GLUCOSE 131 (H) 01/06/2020   CHOL 126 04/22/2019   TRIG 148 01/03/2020   HDL 32 (L) 04/22/2019   LDLCALC 70 04/22/2019   ALT 89 (H) 01/06/2020   AST 44 (H) 01/06/2020   NA 144 01/06/2020   K 4.3 01/06/2020   CL 108 01/06/2020   CREATININE 1.73 (H) 01/06/2020   BUN 31 (H) 01/06/2020   CO2 28 01/06/2020   TSH 1.102 12/09/2019   INR 1.2 12/30/2019   HGBA1C 8.4 (H) 12/08/2019         Assessment / Plan:   64 year old male with with a right-sided loculated pleural effusion.  Image guided drain placement has been performed and we will inject thrombolytics through the chest tube in hopes of breaking up the loculations.  If this is not successful then he will require a thoracoscopy for decortication.      Lajuana Matte 01/07/2020 12:05 PM

## 2020-01-07 NOTE — Progress Notes (Signed)
Patient refused flushing and dressing change of drainage catheter.

## 2020-01-07 NOTE — Progress Notes (Signed)
Referring Physician(s): Dr. Jeffie Pollock  Supervising Physician:Dr. Anselm Pancoast   Patient Status:  [facility]  Chief Complaint: Abdominal pain, recent fevers, hemorrhagic right renal cyst.   Subjective: Patient awake in bed. Pt did not go for VATS as originally planned. Now request for IR to place right chest drain Answers most questions appropriately. Discussed that new plan is for chest tube, not surgery. Contacted wife via telehone  Allergies: Nsaids  Medications:  Current Facility-Administered Medications:  .  0.9 %  sodium chloride infusion, , Intravenous, PRN, Mariel Aloe, MD, Stopped at 12/30/19 1700 .  acetaminophen (TYLENOL) tablet 650 mg, 650 mg, Oral, Q6H PRN, Mariel Aloe, MD, 650 mg at 01/06/20 1700 .  albuterol (PROVENTIL) (2.5 MG/3ML) 0.083% nebulizer solution 2.5 mg, 2.5 mg, Inhalation, Q6H PRN, Mariel Aloe, MD, 2.5 mg at 12/31/19 0900 .  aspirin EC tablet 81 mg, 81 mg, Oral, QHS, Mariel Aloe, MD, 81 mg at 01/06/20 2055 .  carvedilol (COREG) tablet 12.5 mg, 12.5 mg, Oral, BID WC, Mariel Aloe, MD, 12.5 mg at 01/07/20 0840 .  cefTRIAXone (ROCEPHIN) 2 g in sodium chloride 0.9 % 100 mL IVPB, 2 g, Intravenous, Q24H, Mariel Aloe, MD, Stopped at 01/06/20 1228 .  Chlorhexidine Gluconate Cloth 2 % PADS 6 each, 6 each, Topical, Daily, Mariel Aloe, MD, 6 each at 01/06/20 1701 .  DULoxetine (CYMBALTA) DR capsule 20 mg, 20 mg, Oral, Daily, Mariel Aloe, MD, Stopped at 01/06/20 [phone] .  famotidine (PEPCID) tablet 20 mg, 20 mg, Oral, QHS, Mariel Aloe, MD, 20 mg at 01/06/20 2055 .  feeding supplement (ENSURE ENLIVE) (ENSURE ENLIVE) liquid 237 mL, 237 mL, Oral, Q24H, Mariel Aloe, MD, Stopped at 01/06/20 [phone] .  feeding supplement (PRO-STAT SUGAR FREE 64) liquid 30 mL, 30 mL, Oral, BID, Mariel Aloe, MD, 30 mL at 01/06/20 2055 .  fentaNYL (SUBLIMAZE) injection 50 mcg, 50 mcg, Intravenous, Q3H PRN, Mariel Aloe, MD, 50 mcg at 01/06/20 1734 .   fluticasone (FLONASE) 50 MCG/ACT nasal spray 2 spray, 2 spray, Each Nare, Daily, Mariel Aloe, MD, 2 spray at 01/03/20 1152 .  heparin injection 5,000 Units, 5,000 Units, Subcutaneous, Q8H, Mariel Aloe, MD, 5,000 Units at 01/07/20 0544 .  hydrALAZINE (APRESOLINE) injection 10 mg, 10 mg, Intravenous, Q4H PRN, Mariel Aloe, MD, 10 mg at 01/05/20 2207 .  insulin aspart (novoLOG) injection 2-6 Units, 2-6 Units, Subcutaneous, Q4H, Mariel Aloe, MD, 2 Units at 01/07/20 0037 .  MEDLINE mouth rinse, 15 mL, Mouth Rinse, BID, Mariel Aloe, MD, 15 mL at 01/06/20 2056 .  multivitamin with minerals tablet 1 tablet, 1 tablet, Oral, Daily, Mariel Aloe, MD, Stopped at 01/06/20 [phone] .  nutrition supplement (JUVEN) (JUVEN) powder packet 1 packet, 1 packet, Oral, BID BM, Mariel Aloe, MD, 1 packet at 01/06/20 1742 .  oxyCODONE-acetaminophen (PERCOCET/ROXICET) 5-325 MG per tablet 1 tablet, 1 tablet, Oral, Q4H PRN, Mariel Aloe, MD, 1 tablet at 01/07/20 0317 .  polyethylene glycol (MIRALAX / GLYCOLAX) packet 17 g, 17 g, Oral, Daily, Mariel Aloe, MD, Stopped at 01/04/20 1046 .  polyethylene glycol (MIRALAX / GLYCOLAX) packet 17 g, 17 g, Oral, Daily PRN, Amin, Ankit Chirag, MD .  potassium chloride SA (KLOR-CON) CR tablet 20 mEq, 20 mEq, Oral, Daily, Mariel Aloe, MD, Stopped at 01/06/20 [phone] .  predniSONE (DELTASONE) tablet 5 mg, 5 mg, Oral, Q breakfast, Mariel Aloe, MD, Stopped at 01/06/20 [phone] .  pregabalin (LYRICA) capsule 100 mg, 100 mg, Oral, BID, Mariel Aloe, MD, 100 mg at 01/06/20 2055 .  senna-docusate (Senokot-S) tablet 2 tablet, 2 tablet, Oral, QHS PRN, Amin, Ankit Chirag, MD .  sodium chloride flush (NS) 0.9 % injection 10-40 mL, 10-40 mL, Intracatheter, Q12H, Mariel Aloe, MD, 10 mL at 01/06/20 2056 .  sodium chloride flush (NS) 0.9 % injection 10-40 mL, 10-40 mL, Intracatheter, PRN, Mariel Aloe, MD .  sodium chloride flush (NS) 0.9 % injection 5 mL, 5 mL,  Intravenous, Q8H, Nettey, Evalee Jefferson, MD, 5 mL at 01/06/20 2056    Vital Signs: BP (!) 165/104 (BP Location: Left Wrist) Comment: nurse aware  Pulse 84   Temp (!) 100.5 F (38.1 C) (Oral)   Resp 20   Ht 5\' 6"  (1.676 m)   Wt 106.3 kg   SpO2 93%   BMI 37.82 kg/m   Physical Exam Constitutional:      General: He is not in acute distress. Cardiovascular:     Rate and Rhythm: Normal rate.  Pulmonary:     Effort: Pulmonary effort is normal.  Abdominal:     Comments: Right renal drain in place. Dressing is clean and dry. Approximately 15 cc serosanguineous fluid in bulb.   Neurological:     Mental Status: He is alert. He is disoriented.  Psychiatric:        Behavior: Behavior normal.     Imaging: DG Chest 1 View  Result Date: 01/05/2020 CLINICAL DATA:  Status post right thoracentesis. EXAM: CHEST  1 VIEW COMPARISON:  Jan 04, 2020. FINDINGS: Stable cardiomediastinal silhouette. No pneumothorax is noted. Left lung is clear. Mild to moderate right pleural effusion is noted with associated right basilar atelectasis or infiltrate. Bony thorax is unremarkable. IMPRESSION: Mild to moderate right pleural effusion is noted with associated right basilar atelectasis or infiltrate. No pneumothorax is noted. Electronically Signed   By: Marijo Conception M.D.   On: 01/05/2020 16:12   CT CHEST WO CONTRAST  Result Date: 01/04/2020 CLINICAL DATA:  Pleural effusion EXAM: CT CHEST WITHOUT CONTRAST TECHNIQUE: Multidetector CT imaging of the chest was performed following the standard protocol without IV contrast. COMPARISON:  Chest radiograph Dec 25, 2019; chest CT May 16, 2020. FINDINGS: Cardiovascular: There is no evident thoracic aortic aneurysm. There are scattered foci of calcification in visualized great vessels. There are foci of aortic atherosclerosis. There also multiple foci of coronary artery calcification. There is a fairly small pericardial effusion. The pericardium does not appear thickened.  Mediastinum/Nodes: Thyroid appears unremarkable. There are multiple subcentimeter mediastinal lymph nodes. There is no frank adenopathy in the thoracic region by size criteria no esophageal lesions are appreciable. Lungs/Pleura: There is a moderate pleural effusion on the right which has both free-flowing and loculated components. There is consolidation throughout much of the right lower lobe. There is atelectatic change in the left lower lobe as well as mild consolidation in the medial segment of the left lower lobe. Minimal left pleural effusion evident. Upper Abdomen: Visualized upper abdominal structures appear unremarkable. Musculoskeletal: There is multilevel degenerative change in the thoracic spine. There are no appreciable blastic or lytic bone lesions. No chest wall lesions are evident. IMPRESSION: 1. Moderate pleural effusion on the right with both free-flowing and loculated components. Consolidation is noted throughout most of the right lower lobe, likely a combination of compressive atelectasis and pneumonia. 2. Focal infiltrate with atelectasis medial segment left lower lobe. Atelectasis more posteriorly in the left lower lobe  with minimal left pleural effusion. 3.  Fairly small pericardial effusion noted. 4. Aortic atherosclerosis and foci of coronary artery calcification noted. 5. Multiple subcentimeter mediastinal lymph nodes without frank adenopathy by size criteria. Aortic Atherosclerosis (ICD10-I70.0). Electronically Signed   By: Lowella Grip III M.D.   On: 01/04/2020 12:20   DG CHEST PORT 1 VIEW  Result Date: 01/06/2020 CLINICAL DATA:  Respiratory failure.  Hypoxia. EXAM: PORTABLE CHEST 1 VIEW COMPARISON:  One-view chest x-ray 01/05/2020 FINDINGS: Heart size is exaggerated by low lung volumes. Right pleural effusion and basilar airspace disease is again seen. Moderate pulmonary vascular congestion is stable. IMPRESSION: 1. Stable right pleural effusion and basilar airspace disease. 2.  Stable moderate pulmonary vascular congestion. Electronically Signed   By: San Morelle M.D.   On: 01/06/2020 07:46   DG CHEST PORT 1 VIEW  Result Date: 01/04/2020 CLINICAL DATA:  Respiratory failure. EXAM: PORTABLE CHEST 1 VIEW COMPARISON:  Radiographs 01/03/2020 and 01/02/2020. FINDINGS: 0450 hours. Interval extubation and removal of the enteric tube. Stable cardiomegaly, right pleural effusion and probable bibasilar atelectasis. The pulmonary vascularity appears improved without definite residual edema. There is no pneumothorax. The bones appear unchanged. IMPRESSION: Improved pulmonary vascular congestion following extubation. Stable cardiomegaly, right pleural effusion and probable bibasilar atelectasis. Electronically Signed   By: Richardean Sale M.D.   On: 01/04/2020 09:06   US Abdomen Limited RUQ  Result Date: 01/05/2020 CLINICAL DATA:  Elevated LFTs EXAM: ULTRASOUND ABDOMEN LIMITED RIGHT UPPER QUADRANT COMPARISON:  None. FINDINGS: Gallbladder: No gallstones or wall thickening visualized. No sonographic Murphy sign noted by sonographer. Common bile duct: Diameter: 3 mm Liver: Slight increased echogenicity and heterogeneity is noted without focal mass. No biliary ductal dilatation is noted. Portal vein is patent on color Doppler imaging with normal direction of blood flow towards the liver. Other: None. IMPRESSION: Mild increased echogenicity likely related to fatty infiltration. Electronically Signed   By: Inez Catalina M.D.   On: 01/05/2020 16:39   US THORACENTESIS ASP PLEURAL SPACE W/IMG GUIDE  Result Date: 01/05/2020 INDICATION: Recent extubation. Please perform ultrasound-guided right-sided thoracentesis for diagnostic and therapeutic purposes. EXAM: US THORACENTESIS ASP PLEURAL SPACE W/IMG GUIDE COMPARISON:  Chest CT-01/04/2020; chest radiograph-01/04/2020 MEDICATIONS: None. COMPLICATIONS: None immediate. TECHNIQUE: Informed written consent was obtained from the patient after a  discussion of the risks, benefits and alternatives to treatment. A timeout was performed prior to the initiation of the procedure. With the patient positioned right lateral decubitus in his hospital bed, ultrasound scanning was performed of the right hemithorax demonstrating a small complex right-sided pleural effusion, similar to the findings seen on preceding chest CT performed 12/15/2019. As such, the inferior, posterior, lateral aspect of the right hemithorax was prepped and draped in the usual sterile fashion. 1% lidocaine was used for local anesthesia. Under direct ultrasound guidance, a 19 gauge, 7-cm, Yueh catheter was introduced. An ultrasound image was saved for documentation purposes. The thoracentesis was performed, however despite mobilization of the Yueh catheter, only approximately 25 cc of serous fluid was able to be aspirated. The catheter was removed and a dressing was applied. The patient tolerated the procedure well without immediate post procedural complication. The patient was escorted to have an upright chest radiograph. FINDINGS: Small complex right-sided pleural effusion, similar to the findings on preceding chest CT. IMPRESSION: Successful ultrasound-guided right sided thoracentesis yielding only 25 cc of serous pleural fluid. Electronically Signed   By: Sandi Mariscal M.D.   On: 01/05/2020 16:31    Labs:  CBC: Recent Labs  01/03/20 0444 01/04/20 0747 01/05/20 0240 01/06/20 0621  WBC 19.0* 18.2* 16.3* 13.4*  HGB 8.5* 8.1* 8.9* 8.0*  HCT 27.7* 26.0* 29.6* 25.7*  PLT 355 360 342 454*    COAGS: Recent Labs    12/08/19 2014 12/30/19 1106  INR 1.3* 1.2  APTT 33  --     BMP: Recent Labs    01/03/20 0444 01/03/20 0444 01/03/20 1348 01/04/20 0747 01/05/20 0240 01/06/20 0621  NA 146*  --   --  143 144 144  K 3.2*   < > 3.6 3.7 3.9 4.3  CL 111  --   --  108 106 108  CO2 27  --   --  27 23 28   GLUCOSE 186*  --   --  116* 110* 131*  BUN 44*  --   --  46* 34* 31*    CALCIUM 7.8*  --   --  7.9* 8.0* 8.0*  CREATININE 2.13*  --   --  2.09* 1.65* 1.73*  GFRNONAA 32*  --   --  32* 43* 41*  GFRAA 37*  --   --  38* 50* 47*   < > = values in this interval not displayed.    LIVER FUNCTION TESTS: Recent Labs    01/02/20 0521 01/02/20 0521 01/03/20 0444 01/04/20 1532 01/05/20 0240 01/06/20 0621  BILITOT 1.2  --  1.0  --  0.8 0.6  AST 37  --  67*  --  146* 44*  ALT 30  --  67*  --  143* 89*  ALKPHOS 58  --  56  --  76 69  PROT 7.3   < > 6.6 8.6* 7.0 6.8  ALBUMIN 2.6*  --  2.2*  --  2.2* 2.0*   < > = values in this interval not displayed.    Assessment and Plan: Patient admitted with sepsis, possibly secondary to right renal cyst; required intubation. IR placed right renal drain 01/01/20. Patient now extubated. Pleural effusion noted on imaging. Dr. Pascal Lux performed a diagnostic thoracentesis on patient, yielding 25 cc of fluid - lab results consistent with infection/empyema. IR now requested to place perc chest drain. Case reviewed with Dr. Anselm Pancoast who has discussed with Dr. Kipp Brood  20 cc output from right perinephric draindocumented in the past 24 hours. Approximately 15 cc bloody fluid currently in JP drain.  Continue current drain care - flush drain TID, record output, change dressing daily.   Plan for (R)chest drain today. CT surgery on board. Risks and benefits discussed with the patient and his wife including bleeding, infection, damage to adjacent structures, bowel perforation/fistula connection, and sepsis.  All questions were answered, patient/wife is agreeable to proceed. Consent signed and in chart.   Electronically Signed: Ascencion Dike, [name] 01/07/2020, 8:56 AM   I spent a total of 15 Minutes at the the patient's bedside AND on the patient's hospital floor or unit, greater than 50% of which was counseling/coordinating care for right renal cyst drain and right loculated effusion

## 2020-01-07 NOTE — Progress Notes (Signed)
PROGRESS NOTE    Marco [name]  [id number] DOB: 02/11/1956 DOA: 12/27/2019 PCP: [name], MD   Brief Narrative:  64 y.o. male with a history of systolic and diastolic congestive heart failure, nonischemic cardiomyopathy, chronic kidney disease stage III, diabetes mellitus type 2, coronary artery disease, hypertension, obesity, obstructive sleep apnea, atrial fibrillation status post ablation 2017, psoriatic arthritis on daily prednisone. Patient presented secondary to lethargy and weakness and found to have evidence of sepsis from recurrent UTI vs prostatitis from recent UTI. Patient developed respiratory and hemodynamic failure requiring ICU admission, intubation/mechanical ventilation and vasopressor support. While admitted, he has had recurrent/persistent fevers on antibiotics. Infectious disease is on board. Respiratory status improved and he was extubated on 5/25 successfully. CT chest concerning for right loculated fluid which is possibly secondary to infection.    Assessment & Plan:   Principal Problem:   Fever Active Problems:   CAD (coronary artery disease)   Atrial fibrillation (HCC)   Essential hypertension   Chronic kidney disease   Morbid obesity due to excess calories (HCC)   Chronic pain   Type 2 diabetes mellitus with stage 3 chronic kidney disease, without long-term current use of insulin (HCC)   OSA (obstructive sleep apnea)   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HCC)   Diabetic polyneuropathy associated with type 2 diabetes mellitus (HCC)   Acute respiratory failure with hypoxia (HCC)   Chronic combined systolic and diastolic CHF (congestive heart failure) (HCC)   Hyperlipidemia   Urethral stricture   Nephrolithiasis   Complex renal cyst   Sepsis secondary to right-sided pyelonephritis with infected cyst Right-sided loculated effusion -Initially on broad-spectrum antibiotics, now on IV Rocephin -CT surgery consulted for loculated effusion.  Plans for CT-guided  drainage, if not successful patient will require decortication -Status post thoracentesis 5/27- only yielding 25cc.  -CT-guided drain placed in the right renal cyst 5/23 -Follow fluid cultures.  Renal cyst cultures grew E. coli-MDR -Urology signed off.  If fever remains recurrent despite of drainage, will repeat  cultures  Acute respiratory failure with hypoxemia Extubated 5/25.  Supplemental oxygen for now supportive care.  Left maxillary sinus disease Chronic, incidental finding.  Noninfectious source per ID.  AKI on CKD stage IIIa Baseline creatinine of 1.5. Peak creatinine of 2.13 . Today 1.73  Chronic combined systolic and diastolic heart failure EF of 50-55% which is improved from last year. Grade 1 diastolic dysfunction noted.  Continue to monitor fluid status Continue ASA, Coreg  Rheumatoid arthritis Prednisone, xycodone.  Generalized pain -Continue Lyrica and Cymbalta. Titrate Lyrica for renal function  Anemia of chronic disease Hb ~ 8.3 In setting of kidney disease. Stable.  Elevated LFTs; improving.  Likely secondary to hypotension which was noted from 5/23-5/24. RUQ ultrasound obtained with fatty infiltration of the liver. Hepatitis panel negative.  Gout Patient is on allopurinol which has been held secondary to AKI  Pressure injury Left/Right buttock. POA  DVT prophylaxis: None Code Status: Full Family Communication:    Status is: Inpatient  Remains inpatient appropriate because:Hemodynamically unstable   Dispo: The patient is from: Home              Anticipated d/c is to: To be determined              Anticipated d/c date is: > 3 days              Patient currently is not medically stable to d/c.  Having recurrent fevers has loculated effusion which requires further intervention.  Unsafe  for discharge  Radiology studies-  CT head 5/18-negative for acute pathology.  Chronic sinusitis  Ultrasound pelvis 5/18-normal  CT abdomen pelvis  without contrast 5/19-small bilateral pleural effusion, atelectasis, right-sided renal cyst/concerns for pyelonephritis  MRI brain without contrast 5/21-no acute findings.  Chronic sinusitis  Echo 5/22-EF 50-55%, grade 1 DD hypokinesia of the distal septum/apex  EKG 5/24-diffuse severe encephalopathy but no seizures  MRI pelvis 5/24-negative for acute findings  Procedures:   Intubated 5/23, extubated 5/25   Subjective: Patient had fevers overnight.  This morning he appears slightly dyspneic but answering all the questions appropriately.  He is getting ready to go down for CT-guided drainage  Review of Systems Otherwise negative except as per HPI, including: General: Denies fever, chills, night sweats or unintended weight loss. Resp: Shortness of breath Cardiac: Denies chest pain, palpitations, orthopnea, paroxysmal nocturnal dyspnea. GI: Denies abdominal pain, nausea, vomiting, diarrhea or constipation GU: Denies dysuria, frequency, hesitancy or incontinence MS: Denies muscle aches, joint pain or swelling Neuro: Denies headache, neurologic deficits (focal weakness, numbness, tingling), abnormal gait Psych: Denies anxiety, depression, SI/HI/AVH Skin: Denies new rashes or lesions ID: Denies sick contacts, exotic exposures, travel  Examination:  Constitutional: Not in acute distress Respiratory: Bilateral diminished breath sounds, tachypnea Cardiovascular: Normal sinus rhythm, no rubs Abdomen: Nontender nondistended good bowel sounds Musculoskeletal: No edema noted Skin: No rashes seen Neurologic: CN 2-12 grossly intact.  And nonfocal Psychiatric: Normal judgment and insight. Alert and oriented x 3. Normal mood. Right-sided drain noted. Left upper extremity midline in place  Objective: Vitals:   01/07/20 0012 01/07/20 0401 01/07/20 0410 01/07/20 0553  BP: (!) 148/80 (!) 146/76    Pulse: 84 82    Resp: 19 18    Temp: 100.1 F (37.8 C) (!) 101.8 F (38.8 C)  99 F (37.2  C)  TempSrc: Oral Oral  Axillary  SpO2: 95% 95%    Weight:   106.3 kg   Height:        Intake/Output Summary (Last 24 hours) at 01/07/2020 0735 Last data filed at 01/07/2020 0411 Gross per 24 hour  Intake 235 ml  Output 858 ml  Net -623 ml   Filed Weights   01/05/20 0500 01/06/20 0500 01/07/20 0410  Weight: 101.3 kg 105.3 kg 106.3 kg     Data Reviewed:   CBC: Recent Labs  Lab 01/01/20 0750 01/01/20 1845 01/02/20 0521 01/03/20 0444 01/04/20 0747 01/05/20 0240 01/06/20 0621  WBC 14.0*   < > 16.4* 19.0* 18.2* 16.3* 13.4*  NEUTROABS 10.0*  --  11.0* 14.5*  --   --  9.2*  HGB 9.3*   < > 10.2* 8.5* 8.1* 8.9* 8.0*  HCT 30.1*   < > 32.4* 27.7* 26.0* 29.6* 25.7*  MCV 93.2   < > 92.8 92.0 91.9 93.7 91.8  PLT 316   < > 378 355 360 342 454*   < > = values in this interval not displayed.   Basic Metabolic Panel: Recent Labs  Lab 01/01/20 2045 01/02/20 0521 01/02/20 0521 01/02/20 1325 01/02/20 1649 01/03/20 0444 01/03/20 1348 01/04/20 0747 01/05/20 0240 01/06/20 0621  NA  --  148*  --   --   --  146*  --  143 144 144  K  --  3.6   < >  --   --  3.2* 3.6 3.7 3.9 4.3  CL  --  108  --   --   --  111  --  108 106 108  CO2  --  28  --   --   --  27  --  27 23 28   GLUCOSE  --  135*  --   --   --  186*  --  116* 110* 131*  BUN  --  33*  --   --   --  44*  --  46* 34* 31*  CREATININE  --  2.09*  --   --   --  2.13*  --  2.09* 1.65* 1.73*  CALCIUM  --  8.1*  --   --   --  7.8*  --  7.9* 8.0* 8.0*  MG   < >  --   --  2.0 2.1 2.2  --  2.3  --  2.2  PHOS  --   --   --  1.9* 1.9* 2.4*  --  2.5  --  2.5   < > = values in this interval not displayed.   GFR: Estimated Creatinine Clearance: 49.3 mL/min (A) (by C-G formula based on SCr of 1.73 mg/dL (H)). Liver Function Tests: Recent Labs  Lab 01/01/20 0750 01/01/20 0750 01/02/20 0521 01/03/20 0444 01/04/20 1532 01/05/20 0240 01/06/20 0621  AST 17  --  37 67*  --  146* 44*  ALT 17  --  30 67*  --  143* 89*  ALKPHOS 49   --  58 56  --  76 69  BILITOT 1.4*  --  1.2 1.0  --  0.8 0.6  PROT 6.8   < > 7.3 6.6 8.6* 7.0 6.8  ALBUMIN 2.2*  --  2.6* 2.2*  --  2.2* 2.0*   < > = values in this interval not displayed.   No results for input(s): LIPASE, AMYLASE in the last 168 hours. No results for input(s): AMMONIA in the last 168 hours. Coagulation Profile: No results for input(s): INR, PROTIME in the last 168 hours. Cardiac Enzymes: No results for input(s): CKTOTAL, CKMB, CKMBINDEX, TROPONINI in the last 168 hours. BNP (last 3 results) No results for input(s): PROBNP in the last 8760 hours. HbA1C: No results for input(s): HGBA1C in the last 72 hours. CBG: Recent Labs  Lab 01/06/20 1134 01/06/20 1641 01/06/20 2046 01/07/20 0014 01/07/20 0401  GLUCAP 142* 138* 220* 142* 114*   Lipid Profile: No results for input(s): CHOL, HDL, LDLCALC, TRIG, CHOLHDL, LDLDIRECT in the last 72 hours. Thyroid Function Tests: No results for input(s): TSH, T4TOTAL, FREET4, T3FREE, THYROIDAB in the last 72 hours. Anemia Panel: No results for input(s): VITAMINB12, FOLATE, FERRITIN, TIBC, IRON, RETICCTPCT in the last 72 hours. Sepsis Labs: Recent Labs  Lab 12/31/19 1203 01/01/20 0750 01/01/20 1846 01/01/20 2045 01/02/20 0521 01/03/20 1348 01/04/20 1058 01/05/20 0240 01/06/20 0621  PROCALCITON 0.30   < >  --   --  5.11  --  2.60 1.47 0.90  LATICACIDVEN 1.0  --  2.3* 2.7*  --  2.4*  --   --   --    < > = values in this interval not displayed.    Recent Results (from the past 240 hour(s))  Culture, blood (routine x 2)     Status: None   Collection Time: 12/29/19 10:06 AM   Specimen: BLOOD  Result Value Ref Range Status   Specimen Description   Final    BLOOD RIGHT ARM Performed at [facility] [address] [place], [place] [address]    Special Requests   Final    BOTTLES DRAWN AEROBIC AND ANAEROBIC Blood Culture  adequate volume Performed at [facility] [address], [place], [place] [address]    Culture   Final    NO GROWTH 5 DAYS Performed at [facility], [place] [address], [place], [place] [address]    Report Status 01/03/2020 FINAL  Final  Culture, blood (routine x 2)     Status: None   Collection Time: 12/29/19 10:07 AM   Specimen: BLOOD  Result Value Ref Range Status   Specimen Description   Final    BLOOD RIGHT ARM Performed at [facility] [address] [place], [place] [address]    Special Requests   Final    BOTTLES DRAWN AEROBIC AND ANAEROBIC Blood Culture adequate volume Performed at [facility] [address], [place], [place] [address]    Culture   Final    NO GROWTH 5 DAYS Performed at [facility], [place] [address] [place], [place] [address]    Report Status 01/03/2020 FINAL  Final  MRSA PCR Screening     Status: None   Collection Time: 12/30/19  1:14 PM   Specimen: Nasal Mucosa; Nasopharyngeal  Result Value Ref Range Status   MRSA by PCR NEGATIVE NEGATIVE Final    Comment:        The GeneXpert MRSA Assay (FDA approved for NASAL specimens only), is one component of a comprehensive MRSA colonization surveillance program. It is not intended to diagnose MRSA infection nor to guide or monitor treatment for MRSA infections. Performed at [facility], [place] [address] [place], [place] [address]   Aerobic/Anaerobic Culture (surgical/deep wound)     Status: None   Collection Time: 01/01/20  3:14 PM   Specimen: Abscess  Result Value Ref Range Status   Specimen Description   Final    ABSCESS Performed at [facility] [address], [place], [place] [address]    Special Requests   Final    RIGHT RENAL CYST Performed at [facility] [address] [place], [place] [address]    Gram Stain   Final    FEW WBC PRESENT, PREDOMINANTLY PMN NO ORGANISMS SEEN    Culture   Final    FEW ESCHERICHIA COLI NO ANAEROBES  ISOLATED Performed at [facility], [place] [address] [place], [place] [address]    Report Status 01/06/2020 FINAL  Final   Organism ID, Bacteria ESCHERICHIA COLI  Final      Susceptibility   Escherichia coli - MIC*    AMPICILLIN >=32 RESISTANT Resistant     CEFAZOLIN >=64 RESISTANT Resistant     CEFEPIME <=1 SENSITIVE Sensitive     CEFTAZIDIME <=1 SENSITIVE Sensitive     CEFTRIAXONE <=1 SENSITIVE Sensitive     CIPROFLOXACIN <=0.25 SENSITIVE Sensitive     GENTAMICIN <=1 SENSITIVE Sensitive     IMIPENEM <=0.25 SENSITIVE Sensitive     TRIMETH/SULFA <=20 SENSITIVE Sensitive     AMPICILLIN/SULBACTAM >=32 RESISTANT Resistant     PIP/TAZO 8 SENSITIVE Sensitive     * FEW ESCHERICHIA COLI  Culture, blood (Routine X 2) w Reflex to ID Panel     Status: None (Preliminary result)   Collection Time: 01/04/20 10:58 AM   Specimen: BLOOD  Result Value Ref Range Status   Specimen Description   Final    BLOOD LEFT ARM Performed at [facility] [address], [place], [place] [address]    Special Requests   Final  BOTTLES DRAWN AEROBIC AND ANAEROBIC Blood Culture adequate volume Performed at [facility] [address] [place], [place] [address]    Culture   Final    NO GROWTH 3 DAYS Performed at [facility], [place] [address] [place], [place] [address]    Report Status PENDING  Incomplete  Culture, blood (Routine X 2) w Reflex to ID Panel     Status: None (Preliminary result)   Collection Time: 01/04/20 10:58 AM   Specimen: BLOOD  Result Value Ref Range Status   Specimen Description   Final    BLOOD LEFT ARM Performed at [facility] [address] [place], [place] [address]    Special Requests   Final    BOTTLES DRAWN AEROBIC AND ANAEROBIC Blood Culture adequate volume Performed at [facility] [address], [place], [place] [address]    Culture   Final    NO GROWTH 3 DAYS Performed at  [facility], [place] [address] [place], [place] [address]    Report Status PENDING  Incomplete  Body fluid culture     Status: None (Preliminary result)   Collection Time: 01/05/20  3:38 PM   Specimen: Lung, Right; Pleural Fluid  Result Value Ref Range Status   Specimen Description   Final    PLEURAL Performed at [facility] [address] [place], [place] [address]    Special Requests   Final    NONE Performed at [facility], [place] [address], [place], [place] [address]    Gram Stain   Final    FEW WBC PRESENT, PREDOMINANTLY PMN NO ORGANISMS SEEN    Culture   Final    NO GROWTH < 12 HOURS Performed at [facility] [address] [place], [place] [address]    Report Status PENDING  Incomplete         Radiology Studies: DG Chest 1 View  Result Date: 01/05/2020 CLINICAL DATA:  Status post right thoracentesis. EXAM: CHEST  1 VIEW COMPARISON:  Jan 04, 2020. FINDINGS: Stable cardiomediastinal silhouette. No pneumothorax is noted. Left lung is clear. Mild to moderate right pleural effusion is noted with associated right basilar atelectasis or infiltrate. Bony thorax is unremarkable. IMPRESSION: Mild to moderate right pleural effusion is noted with associated right basilar atelectasis or infiltrate. No pneumothorax is noted. Electronically Signed   By: Marijo Conception M.D.   On: 01/05/2020 16:12   DG CHEST PORT 1 VIEW  Result Date: 01/06/2020 CLINICAL DATA:  Respiratory failure.  Hypoxia. EXAM: PORTABLE CHEST 1 VIEW COMPARISON:  One-view chest x-ray 01/05/2020 FINDINGS: Heart size is exaggerated by low lung volumes. Right pleural effusion and basilar airspace disease is again seen. Moderate pulmonary vascular congestion is stable. IMPRESSION: 1. Stable right pleural effusion and basilar airspace disease. 2. Stable moderate pulmonary vascular congestion. Electronically Signed   By: San Morelle M.D.   On: 01/06/2020 07:46    US Abdomen Limited RUQ  Result Date: 01/05/2020 CLINICAL DATA:  Elevated LFTs EXAM: ULTRASOUND ABDOMEN LIMITED RIGHT UPPER QUADRANT COMPARISON:  None. FINDINGS: Gallbladder: No gallstones or wall thickening visualized. No sonographic Murphy sign noted by sonographer. Common bile duct: Diameter: 3 mm Liver: Slight increased echogenicity and heterogeneity is noted without focal mass. No biliary ductal dilatation is noted. Portal vein is patent on color Doppler imaging with normal direction of blood flow towards the liver. Other: None. IMPRESSION: Mild increased echogenicity likely related to  fatty infiltration. Electronically Signed   By: Inez Catalina M.D.   On: 01/05/2020 16:39   US THORACENTESIS ASP PLEURAL SPACE W/IMG GUIDE  Result Date: 01/05/2020 INDICATION: Recent extubation. Please perform ultrasound-guided right-sided thoracentesis for diagnostic and therapeutic purposes. EXAM: US THORACENTESIS ASP PLEURAL SPACE W/IMG GUIDE COMPARISON:  Chest CT-01/04/2020; chest radiograph-01/04/2020 MEDICATIONS: None. COMPLICATIONS: None immediate. TECHNIQUE: Informed written consent was obtained from the patient after a discussion of the risks, benefits and alternatives to treatment. A timeout was performed prior to the initiation of the procedure. With the patient positioned right lateral decubitus in his hospital bed, ultrasound scanning was performed of the right hemithorax demonstrating a small complex right-sided pleural effusion, similar to the findings seen on preceding chest CT performed 12/15/2019. As such, the inferior, posterior, lateral aspect of the right hemithorax was prepped and draped in the usual sterile fashion. 1% lidocaine was used for local anesthesia. Under direct ultrasound guidance, a 19 gauge, 7-cm, Yueh catheter was introduced. An ultrasound image was saved for documentation purposes. The thoracentesis was performed, however despite mobilization of the Yueh catheter, only approximately  25 cc of serous fluid was able to be aspirated. The catheter was removed and a dressing was applied. The patient tolerated the procedure well without immediate post procedural complication. The patient was escorted to have an upright chest radiograph. FINDINGS: Small complex right-sided pleural effusion, similar to the findings on preceding chest CT. IMPRESSION: Successful ultrasound-guided right sided thoracentesis yielding only 25 cc of serous pleural fluid. Electronically Signed   By: Sandi Mariscal M.D.   On: 01/05/2020 16:31        Scheduled Meds: . aspirin EC  81 mg Oral QHS  . carvedilol  12.5 mg Oral BID WC  . Chlorhexidine Gluconate Cloth  6 each Topical Daily  . DULoxetine  20 mg Oral Daily  . famotidine  20 mg Oral QHS  . feeding supplement (ENSURE ENLIVE)  237 mL Oral Q24H  . feeding supplement (PRO-STAT SUGAR FREE 64)  30 mL Oral BID  . fluticasone  2 spray Each Nare Daily  . heparin injection (subcutaneous)  5,000 Units Subcutaneous Q8H  . insulin aspart  2-6 Units Subcutaneous Q4H  . mouth rinse  15 mL Mouth Rinse BID  . multivitamin with minerals  1 tablet Oral Daily  . nutrition supplement (JUVEN)  1 packet Oral BID BM  . polyethylene glycol  17 g Oral Daily  . potassium chloride SA  20 mEq Oral Daily  . predniSONE  5 mg Oral Q breakfast  . pregabalin  100 mg Oral BID  . sodium chloride flush  10-40 mL Intracatheter Q12H  . sodium chloride flush  5 mL Intravenous Q8H   Continuous Infusions: . sodium chloride Stopped (12/30/19 1700)  . cefTRIAXone (ROCEPHIN)  IV Stopped (01/06/20 1228)     LOS: 11 days   Time spent= 35 mins    Ollie Esty Arsenio Loader, MD [facility]  If 7PM-7AM, please contact night-coverage  01/07/2020, 7:35 AM

## 2020-01-07 NOTE — Procedures (Signed)
Interventional Radiology Procedure:   Indications: Complex right pleural effusion  Procedure: Image guided right chest tube placement  Findings: Loculated right pleural effusion.  14 Fr drain placed and yellow fluid aspirated.  Attached to PleurEvac.  Complications: None     EBL: less than 10 ml  Plan: Wall suction.  Management per CT surgery.     Miaa Latterell R. Anselm Pancoast, MD  Pager: [phone]

## 2020-01-08 ENCOUNTER — Inpatient Hospital Stay (HOSPITAL_COMMUNITY): Payer: Medicaid Other

## 2020-01-08 DIAGNOSIS — N151 Renal and perinephric abscess: Principal | ICD-10-CM

## 2020-01-08 DIAGNOSIS — I5042 Chronic combined systolic (congestive) and diastolic (congestive) heart failure: Secondary | ICD-10-CM

## 2020-01-08 DIAGNOSIS — R7989 Other specified abnormal findings of blood chemistry: Secondary | ICD-10-CM

## 2020-01-08 DIAGNOSIS — R531 Weakness: Secondary | ICD-10-CM

## 2020-01-08 DIAGNOSIS — J90 Pleural effusion, not elsewhere classified: Secondary | ICD-10-CM

## 2020-01-08 DIAGNOSIS — E7800 Pure hypercholesterolemia, unspecified: Secondary | ICD-10-CM

## 2020-01-08 LAB — GLUCOSE, CAPILLARY
Glucose-Capillary: 106 mg/dL — ABNORMAL HIGH (ref 70–99)
Glucose-Capillary: 128 mg/dL — ABNORMAL HIGH (ref 70–99)
Glucose-Capillary: 124 mg/dL — ABNORMAL HIGH (ref 70–99)
Glucose-Capillary: 133 mg/dL — ABNORMAL HIGH (ref 70–99)
Glucose-Capillary: 151 mg/dL — ABNORMAL HIGH (ref 70–99)
Glucose-Capillary: 144 mg/dL — ABNORMAL HIGH (ref 70–99)
Glucose-Capillary: 168 mg/dL — ABNORMAL HIGH (ref 70–99)
Glucose-Capillary: 153 mg/dL — ABNORMAL HIGH (ref 70–99)

## 2020-01-08 LAB — BASIC METABOLIC PANEL
Anion gap: 8 (ref 5–15)
BUN: 24 mg/dL — ABNORMAL HIGH (ref 8–23)
CO2: 27 mmol/L (ref 22–32)
Calcium: 8.3 mg/dL — ABNORMAL LOW (ref 8.9–10.3)
Chloride: 108 mmol/L (ref 98–111)
Creatinine, Ser: 1.61 mg/dL — ABNORMAL HIGH (ref 0.61–1.24)
GFR calc Af Amer: 52 mL/min — ABNORMAL LOW (ref >60)
GFR calc non Af Amer: 45 mL/min — ABNORMAL LOW (ref >60)
Glucose, Bld: 142 mg/dL — ABNORMAL HIGH (ref 70–99)
Potassium: 4.4 mmol/L (ref 3.5–5.1)
Sodium: 143 mmol/L (ref 135–145)

## 2020-01-08 LAB — CBC
HCT: 24 % — ABNORMAL LOW (ref 39.0–52.0)
Hemoglobin: 7.5 g/dL — ABNORMAL LOW (ref 13.0–17.0)
MCH: 28.4 pg (ref 26.0–34.0)
MCHC: 31.3 g/dL (ref 30.0–36.0)
MCV: 90.9 fL (ref 80.0–100.0)
Platelets: 460 10*3/uL — ABNORMAL HIGH (ref 150–400)
RBC: 2.64 MIL/uL — ABNORMAL LOW (ref 4.22–5.81)
RDW: 17 % — ABNORMAL HIGH (ref 11.5–15.5)
WBC: 14.2 10*3/uL — ABNORMAL HIGH (ref 4.0–10.5)
nRBC: 0.2 % (ref 0.0–0.2)

## 2020-01-08 LAB — MAGNESIUM: Magnesium: 1.9 mg/dL (ref 1.7–2.4)

## 2020-01-08 NOTE — Progress Notes (Signed)
PROGRESS NOTE    Marco [name]  [id number] DOB: 10-Sep-1955 DOA: 12/27/2019 PCP: [name], MD   Brief Narrative:  64 y.o. male with a history of systolic and diastolic congestive heart failure, nonischemic cardiomyopathy, chronic kidney disease stage III, diabetes mellitus type 2, coronary artery disease, hypertension, obesity, obstructive sleep apnea, atrial fibrillation status post ablation 2017, psoriatic arthritis on daily prednisone. Patient presented secondary to lethargy and weakness and found to have evidence of sepsis from recurrent UTI vs prostatitis from recent UTI. Patient developed respiratory and hemodynamic failure requiring ICU admission, intubation/mechanical ventilation and vasopressor support. While admitted, he has had recurrent/persistent fevers on antibiotics. Infectious disease is on board. Respiratory status improved and he was extubated on 5/25 successfully. CT chest concerning for right loculated fluid which is possibly secondary to infection.    Assessment & Plan:   Principal Problem:   Fever Active Problems:   CAD (coronary artery disease)   Atrial fibrillation (HCC)   Essential hypertension   Chronic kidney disease   Morbid obesity due to excess calories (HCC)   Chronic pain   Type 2 diabetes mellitus with stage 3 chronic kidney disease, without long-term current use of insulin (HCC)   OSA (obstructive sleep apnea)   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HCC)   Diabetic polyneuropathy associated with type 2 diabetes mellitus (HCC)   Acute respiratory failure with hypoxia (HCC)   Chronic combined systolic and diastolic CHF (congestive heart failure) (HCC)   Hyperlipidemia   Urethral stricture   Nephrolithiasis   Complex renal cyst   Sepsis secondary to right-sided pyelonephritis with infected cyst Right-sided loculated effusion -Continue IV Rocephin -CT surgery following. -Right-sided chest tube placed 5/29-management per IR and CT surgery. -Status  post thoracentesis 5/27- only yielding 25cc.  -CT-guided drain placed in the right renal cyst 5/23 -Follow fluid cultures.  Renal cyst cultures grew E. coli-MDR -Urology signed off.  If spikes fever, repeat cultures  Acute respiratory failure with hypoxemia Extubated 5/25.  Supplemental oxygen for now supportive care.  Left maxillary sinus disease Chronic, incidental finding.  Noninfectious source per ID.  AKI on CKD stage IIIa Baseline creatinine of 1.5. Peak creatinine of 2.13 . Today 1.61  Chronic combined systolic and diastolic heart failure EF of 50-55% which is improved from last year. Grade 1 diastolic dysfunction noted.  Continue to monitor fluid status Continue ASA, Coreg  Rheumatoid arthritis Prednisone, Oxycodone.  Generalized pain -Continue Lyrica and Cymbalta. Titrate Lyrica for renal function  Anemia of chronic disease Hb ~ 8.3 In setting of kidney disease. Stable.  Hemoglobin today 7.5.  Elevated LFTs; improving.  Likely secondary to hypotension which was noted from 5/23-5/24. RUQ ultrasound obtained with fatty infiltration of the liver. Hepatitis panel negative.  Gout Patient is on allopurinol which has been held secondary to AKI  Pressure injury Left/Right buttock. POA  DVT prophylaxis: None Code Status: Full Family Communication:  Mrs Gago updated.   Status is: Inpatient  Remains inpatient appropriate because:Hemodynamically unstable   Dispo: The patient is from: Home              Anticipated d/c is to: To be determined              Anticipated d/c date is: > 3 days              Patient currently is not medically stable to d/c.  Having recurrent fevers has loculated effusion which requires further intervention.  Unsafe for discharge  Radiology studies-  CT head 5/18-negative  for acute pathology.  Chronic sinusitis  Ultrasound pelvis 5/18-normal  CT abdomen pelvis without contrast 5/19-small bilateral pleural effusion,  atelectasis, right-sided renal cyst/concerns for pyelonephritis  MRI brain without contrast 5/21-no acute findings.  Chronic sinusitis  Echo 5/22-EF 50-55%, grade 1 DD hypokinesia of the distal septum/apex  EKG 5/24-diffuse severe encephalopathy but no seizures  MRI pelvis 5/24-negative for acute findings  Procedures:   Intubated 5/23, extubated 5/25   Subjective: Tolerated his procedure well yesterday.  No complaints this morning.  Review of Systems Otherwise negative except as per HPI, including: General: Denies fever, chills, night sweats or unintended weight loss. Resp: Denies cough, wheezing, shortness of breath. Cardiac: Denies chest pain, palpitations, orthopnea, paroxysmal nocturnal dyspnea. GI: Denies abdominal pain, nausea, vomiting, diarrhea or constipation GU: Denies dysuria, frequency, hesitancy or incontinence MS: Denies muscle aches, joint pain or swelling Neuro: Denies headache, neurologic deficits (focal weakness, numbness, tingling), abnormal gait Psych: Denies anxiety, depression, SI/HI/AVH Skin: Denies new rashes or lesions ID: Denies sick contacts, exotic exposures, travel  Examination:  Constitutional: Not in acute distress Respiratory: Diminished breath sounds at the bases bilaterally more so on the right Cardiovascular: Normal sinus rhythm, no rubs Abdomen: Nontender nondistended good bowel sounds Musculoskeletal: No edema noted Skin: No rashes seen Neurologic: CN 2-12 grossly intact.  And nonfocal Psychiatric: Normal judgment and insight. Alert and oriented x 3. Normal mood. Right-sided drain noted. Right chest wall, chest tube noted. Left upper extremity midline in place  Objective: Vitals:   01/07/20 1932 01/07/20 2300 01/08/20 0400 01/08/20 0809  BP: 118/67 99/65 123/70 128/66  Pulse: 71 67 70 71  Resp: 15 15 14 17   Temp:  99 F (37.2 C) 98.9 F (37.2 C) 98.6 F (37 C)  TempSrc:  Oral Oral Oral  SpO2: 95% 96% 96% 96%  Weight:    108.4 kg   Height:        Intake/Output Summary (Last 24 hours) at 01/08/2020 0958 Last data filed at 01/08/2020 0521 Gross per 24 hour  Intake 125 ml  Output 925 ml  Net -800 ml   Filed Weights   01/06/20 0500 01/07/20 0410 01/08/20 0400  Weight: 105.3 kg 106.3 kg 108.4 kg     Data Reviewed:   CBC: Recent Labs  Lab 01/02/20 0521 01/02/20 0521 01/03/20 0444 01/04/20 0747 01/05/20 0240 01/06/20 0621 01/08/20 0250  WBC 16.4*   < > 19.0* 18.2* 16.3* 13.4* 14.2*  NEUTROABS 11.0*  --  14.5*  --   --  9.2*  --   HGB 10.2*   < > 8.5* 8.1* 8.9* 8.0* 7.5*  HCT 32.4*   < > 27.7* 26.0* 29.6* 25.7* 24.0*  MCV 92.8   < > 92.0 91.9 93.7 91.8 90.9  PLT 378   < > 355 360 342 454* 460*   < > = values in this interval not displayed.   Basic Metabolic Panel: Recent Labs  Lab 01/02/20 0521 01/02/20 1325 01/02/20 1325 01/02/20 1649 01/03/20 0444 01/03/20 0444 01/03/20 1348 01/04/20 0747 01/05/20 0240 01/06/20 0621 01/08/20 0250  NA   < >  --   --   --  146*  --   --  143 144 144 143  K   < >  --   --   --  3.2*   < > 3.6 3.7 3.9 4.3 4.4  CL   < >  --   --   --  111  --   --  108 106 108 108  CO2   < >  --   --   --  27  --   --  27 23 28 27   GLUCOSE   < >  --   --   --  186*  --   --  116* 110* 131* 142*  BUN   < >  --   --   --  44*  --   --  46* 34* 31* 24*  CREATININE   < >  --   --   --  2.13*  --   --  2.09* 1.65* 1.73* 1.61*  CALCIUM   < >  --   --   --  7.8*  --   --  7.9* 8.0* 8.0* 8.3*  MG  --  2.0   < > 2.1 2.2  --   --  2.3  --  2.2 1.9  PHOS  --  1.9*  --  1.9* 2.4*  --   --  2.5  --  2.5  --    < > = values in this interval not displayed.   GFR: Estimated Creatinine Clearance: 53.5 mL/min (A) (by C-G formula based on SCr of 1.61 mg/dL (H)). Liver Function Tests: Recent Labs  Lab 01/02/20 0521 01/03/20 0444 01/04/20 1532 01/05/20 0240 01/06/20 0621  AST 37 67*  --  146* 44*  ALT 30 67*  --  143* 89*  ALKPHOS 58 56  --  76 69  BILITOT 1.2 1.0  --  0.8  0.6  PROT 7.3 6.6 8.6* 7.0 6.8  ALBUMIN 2.6* 2.2*  --  2.2* 2.0*   No results for input(s): LIPASE, AMYLASE in the last 168 hours. No results for input(s): AMMONIA in the last 168 hours. Coagulation Profile: No results for input(s): INR, PROTIME in the last 168 hours. Cardiac Enzymes: No results for input(s): CKTOTAL, CKMB, CKMBINDEX, TROPONINI in the last 168 hours. BNP (last 3 results) No results for input(s): PROBNP in the last 8760 hours. HbA1C: No results for input(s): HGBA1C in the last 72 hours. CBG: Recent Labs  Lab 01/07/20 2303 01/07/20 2359 01/08/20 0131 01/08/20 0441 01/08/20 0806  GLUCAP 151* 133* 106* 128* 124*   Lipid Profile: No results for input(s): CHOL, HDL, LDLCALC, TRIG, CHOLHDL, LDLDIRECT in the last 72 hours. Thyroid Function Tests: No results for input(s): TSH, T4TOTAL, FREET4, T3FREE, THYROIDAB in the last 72 hours. Anemia Panel: No results for input(s): VITAMINB12, FOLATE, FERRITIN, TIBC, IRON, RETICCTPCT in the last 72 hours. Sepsis Labs: Recent Labs  Lab 01/01/20 1846 01/01/20 2045 01/02/20 0521 01/03/20 1348 01/04/20 1058 01/05/20 0240 01/06/20 0621  PROCALCITON  --   --  5.11  --  2.60 1.47 0.90  LATICACIDVEN 2.3* 2.7*  --  2.4*  --   --   --     Recent Results (from the past 240 hour(s))  Culture, blood (routine x 2)     Status: None   Collection Time: 12/29/19 10:06 AM   Specimen: BLOOD  Result Value Ref Range Status   Specimen Description   Final    BLOOD RIGHT ARM Performed at [facility] [address] [place], [place] [address]    Special Requests   Final    BOTTLES DRAWN AEROBIC AND ANAEROBIC Blood Culture adequate volume Performed at [facility] [address], [place], [place] [address]    Culture   Final    NO GROWTH 5 DAYS Performed at [facility], [address] [place], [place] [address]    Report Status 01/03/2020 FINAL  Final  Culture, blood (routine x 2)      Status: None   Collection Time: 12/29/19 10:07 AM   Specimen: BLOOD  Result Value Ref Range Status   Specimen Description   Final    BLOOD RIGHT ARM Performed at [facility] [address], [place], [place] [address]    Special Requests   Final    BOTTLES DRAWN AEROBIC AND ANAEROBIC Blood Culture adequate volume Performed at [facility] [address] [place], [place] [address]    Culture   Final    NO GROWTH 5 DAYS Performed at [facility], [place] [address] [place], [place] [address]    Report Status 01/03/2020 FINAL  Final  MRSA PCR Screening     Status: None   Collection Time: 12/30/19  1:14 PM   Specimen: Nasal Mucosa; Nasopharyngeal  Result Value Ref Range Status   MRSA by PCR NEGATIVE NEGATIVE Final    Comment:        The GeneXpert MRSA Assay (FDA approved for NASAL specimens only), is one component of a comprehensive MRSA colonization surveillance program. It is not intended to diagnose MRSA infection nor to guide or monitor treatment for MRSA infections. Performed at [facility], [place] [address], [place], [place] [address]   Aerobic/Anaerobic Culture (surgical/deep wound)     Status: None   Collection Time: 01/01/20  3:14 PM   Specimen: Abscess  Result Value Ref Range Status   Specimen Description   Final    ABSCESS Performed at [facility] [address], [place], [place] [address]    Special Requests   Final    RIGHT RENAL CYST Performed at [facility] [address] [place], [place] [address]    Gram Stain   Final    FEW WBC PRESENT, PREDOMINANTLY PMN NO ORGANISMS SEEN    Culture   Final    FEW ESCHERICHIA COLI NO ANAEROBES ISOLATED Performed at [facility], [place] [address] [place], [place] [address]    Report Status 01/06/2020 FINAL  Final   Organism ID, Bacteria ESCHERICHIA COLI  Final      Susceptibility   Escherichia coli - MIC*     AMPICILLIN >=32 RESISTANT Resistant     CEFAZOLIN >=64 RESISTANT Resistant     CEFEPIME <=1 SENSITIVE Sensitive     CEFTAZIDIME <=1 SENSITIVE Sensitive     CEFTRIAXONE <=1 SENSITIVE Sensitive     CIPROFLOXACIN <=0.25 SENSITIVE Sensitive     GENTAMICIN <=1 SENSITIVE Sensitive     IMIPENEM <=0.25 SENSITIVE Sensitive     TRIMETH/SULFA <=20 SENSITIVE Sensitive     AMPICILLIN/SULBACTAM >=32 RESISTANT Resistant     PIP/TAZO 8 SENSITIVE Sensitive     * FEW ESCHERICHIA COLI  Culture, blood (Routine X 2) w Reflex to ID Panel     Status: None (Preliminary result)   Collection Time: 01/04/20 10:58 AM   Specimen: BLOOD  Result Value Ref Range Status   Specimen Description   Final    BLOOD LEFT ARM Performed at [facility] [address], [place], [place] [address]    Special Requests   Final    BOTTLES DRAWN AEROBIC AND ANAEROBIC Blood Culture adequate volume Performed at [facility] [address], [place], [place] [address]    Culture   Final    NO  GROWTH 4 DAYS Performed at [facility], [place] [address] [place], [place] [address]    Report Status PENDING  Incomplete  Culture, blood (Routine X 2) w Reflex to ID Panel     Status: None (Preliminary result)   Collection Time: 01/04/20 10:58 AM   Specimen: BLOOD  Result Value Ref Range Status   Specimen Description   Final    BLOOD LEFT ARM Performed at [facility] [address], [place], [place] [address]    Special Requests   Final    BOTTLES DRAWN AEROBIC AND ANAEROBIC Blood Culture adequate volume Performed at [facility] [address], [place], [place] [address]    Culture   Final    NO GROWTH 4 DAYS Performed at [facility], [place] [address], [place], [place] [address]    Report Status PENDING  Incomplete  Body fluid culture     Status: None (Preliminary result)   Collection Time: 01/05/20  3:38 PM   Specimen: Lung, Right;  Pleural Fluid  Result Value Ref Range Status   Specimen Description   Final    PLEURAL Performed at [facility] [address] [place], [place] [address]    Special Requests   Final    NONE Performed at [facility], [place] [address] [place], [place] [address]    Gram Stain   Final    FEW WBC PRESENT, PREDOMINANTLY PMN NO ORGANISMS SEEN    Culture   Final    NO GROWTH 3 DAYS Performed at [facility] [address], [place], [place] [address]    Report Status PENDING  Incomplete         Radiology Studies: IR Guided Drain W Catheter Placement  Result Date: 01/07/2020 INDICATION: 64 year old with complex right pleural effusion. Request for chest tube placement. EXAM: IMAGE GUIDED RIGHT CHEST TUBE PLACEMENT MEDICATIONS: Moderate sedation ANESTHESIA/SEDATION: Fentanyl 50 mcg IV; Versed 1.0 mg IV Moderate Sedation Time:  18 minutes The patient was continuously monitored during the procedure by the interventional radiology nurse under my direct supervision. COMPLICATIONS: None immediate. PROCEDURE: Informed written consent was obtained from the patient's wife after a thorough discussion of the procedural risks, benefits and alternatives. All questions were addressed. Maximal Sterile Barrier Technique was utilized including caps, mask, sterile gowns, sterile gloves, sterile drape, hand hygiene and skin antiseptic. A timeout was performed prior to the initiation of the procedure. Patient was rolled on his left side. Right chest was evaluated with ultrasound. Adequate pleural pocket was identified with ultrasound in the right mid axillary region. The right side of the chest was prepped and draped in sterile fashion. Skin was anesthetized with 1% lidocaine. Small incision was made. Using ultrasound guidance, a Yueh catheter was directed into the pleural space and yellow pleural fluid was aspirated. Superstiff Amplatz wire was advanced into the pleural  space and the tract was dilated to accommodate a 14 [place] multipurpose drain. Catheter was sutured to skin and attached to a PleurEvac collecting system. Fluoroscopic and ultrasound images were taken and saved for documentation. FINDINGS: Right pleural fluid has some loculations. 19 French drain was placed within the right pleural collection. Yellow fluid was draining from the chest tube at the end of the procedure. IMPRESSION: Successful placement of right chest tube using image guidance. Electronically Signed   By: Markus Daft M.D.   On: 01/07/2020 11:35        Scheduled Meds: .  aspirin EC  81 mg Oral QHS  . carvedilol  12.5 mg Oral BID WC  . Chlorhexidine Gluconate Cloth  6 each Topical Daily  . DULoxetine  20 mg Oral Daily  . famotidine  20 mg Oral QHS  . feeding supplement (ENSURE ENLIVE)  237 mL Oral Q24H  . feeding supplement (PRO-STAT SUGAR FREE 64)  30 mL Oral BID  . fluticasone  2 spray Each Nare Daily  . heparin injection (subcutaneous)  5,000 Units Subcutaneous Q8H  . insulin aspart  2-6 Units Subcutaneous Q4H  . mouth rinse  15 mL Mouth Rinse BID  . multivitamin with minerals  1 tablet Oral Daily  . nutrition supplement (JUVEN)  1 packet Oral BID BM  . polyethylene glycol  17 g Oral Daily  . potassium chloride SA  20 mEq Oral Daily  . predniSONE  5 mg Oral Q breakfast  . pregabalin  100 mg Oral BID  . sodium chloride flush  10-40 mL Intracatheter Q12H  . sodium chloride flush  5 mL Intravenous Q8H   Continuous Infusions: . sodium chloride Stopped (12/30/19 1700)  . cefTRIAXone (ROCEPHIN)  IV 2 g (01/07/20 1326)     LOS: 12 days   Time spent= 35 mins    Malcolm Hetz Arsenio Loader, MD [facility]  If 7PM-7AM, please contact night-coverage  01/08/2020, 9:58 AM

## 2020-01-08 NOTE — Progress Notes (Signed)
     [address]       [place] [address]             [phone]      Chest tube in position. Serous drainage  We will start thrombolytic therapy tomorrow.  Nahmir Zeidman Bary Leriche

## 2020-01-08 NOTE — Progress Notes (Signed)
Subjective: No new complaints   Antibiotics:  Anti-infectives (From admission, onward)   Start     Dose/Rate Route Frequency Ordered Stop   01/03/20 1200  cefTRIAXone (ROCEPHIN) 2 g in sodium chloride 0.9 % 100 mL IVPB     2 g 200 mL/hr over 30 Minutes Intravenous Every 24 hours 01/03/20 0933     01/02/20 1200  meropenem (MERREM) 1 g in sodium chloride 0.9 % 100 mL IVPB  Status:  Discontinued     1 g 200 mL/hr over 30 Minutes Intravenous Every 12 hours 01/02/20 0808 01/02/20 0832   01/02/20 1200  meropenem (MERREM) 2 g in sodium chloride 0.9 % 100 mL IVPB  Status:  Discontinued     2 g 200 mL/hr over 30 Minutes Intravenous Every 12 hours 01/02/20 0832 01/03/20 0933   01/02/20 1130  meropenem (MERREM) 2 g in sodium chloride 0.9 % 100 mL IVPB  Status:  Discontinued     2 g 200 mL/hr over 30 Minutes Intravenous Every 12 hours 01/02/20 1126 01/02/20 1144   01/01/20 0400  vancomycin (VANCOCIN) IVPB 1000 mg/200 mL premix  Status:  Discontinued     1,000 mg 200 mL/hr over 60 Minutes Intravenous Every 24 hours 12/31/19 0920 01/03/20 0907   12/31/19 1800  meropenem (MERREM) 1 g in sodium chloride 0.9 % 100 mL IVPB  Status:  Discontinued     1 g 200 mL/hr over 30 Minutes Intravenous Every 8 hours 12/31/19 1544 01/02/20 0808   12/31/19 1530  meropenem (MERREM) injection 2 g  Status:  Discontinued     2 g Intramuscular Every 8 hours 12/31/19 1525 12/31/19 1543   12/30/19 0400  vancomycin (VANCOREADY) IVPB 750 mg/150 mL  Status:  Discontinued     750 mg 150 mL/hr over 60 Minutes Intravenous Every 12 hours 12/29/19 1340 12/31/19 0920   12/29/19 1430  ceFEPIme (MAXIPIME) 2 g in sodium chloride 0.9 % 100 mL IVPB  Status:  Discontinued     2 g 200 mL/hr over 30 Minutes Intravenous Every 8 hours 12/29/19 1340 12/31/19 1525   12/29/19 1400  vancomycin (VANCOREADY) IVPB 2000 mg/400 mL     2,000 mg 200 mL/hr over 120 Minutes Intravenous  Once 12/29/19 1340 12/29/19 1838   12/27/19 0730   cefTRIAXone (ROCEPHIN) 1 g in sodium chloride 0.9 % 100 mL IVPB  Status:  Discontinued     1 g 200 mL/hr over 30 Minutes Intravenous Every 24 hours 12/27/19 0714 12/29/19 1239      Medications: Scheduled Meds: . aspirin EC  81 mg Oral QHS  . carvedilol  12.5 mg Oral BID WC  . Chlorhexidine Gluconate Cloth  6 each Topical Daily  . DULoxetine  20 mg Oral Daily  . famotidine  20 mg Oral QHS  . feeding supplement (ENSURE ENLIVE)  237 mL Oral Q24H  . feeding supplement (PRO-STAT SUGAR FREE 64)  30 mL Oral BID  . fluticasone  2 spray Each Nare Daily  . heparin injection (subcutaneous)  5,000 Units Subcutaneous Q8H  . insulin aspart  2-6 Units Subcutaneous Q4H  . mouth rinse  15 mL Mouth Rinse BID  . multivitamin with minerals  1 tablet Oral Daily  . nutrition supplement (JUVEN)  1 packet Oral BID BM  . polyethylene glycol  17 g Oral Daily  . potassium chloride SA  20 mEq Oral Daily  . predniSONE  5 mg Oral Q breakfast  . pregabalin  100  mg Oral BID  . sodium chloride flush  10-40 mL Intracatheter Q12H  . sodium chloride flush  5 mL Intravenous Q8H   Continuous Infusions: . sodium chloride Stopped (12/30/19 1700)  . cefTRIAXone (ROCEPHIN)  IV 2 g (01/08/20 1244)   PRN Meds:.sodium chloride, acetaminophen, albuterol, fentaNYL (SUBLIMAZE) injection, hydrALAZINE, oxyCODONE-acetaminophen, polyethylene glycol, senna-docusate, sodium chloride flush    Objective: Weight change: 2.1 kg  Intake/Output Summary (Last 24 hours) at 01/08/2020 1318 Last data filed at 01/08/2020 1000 Gross per 24 hour  Intake 125 ml  Output 1085 ml  Net -960 ml   Blood pressure 128/66, pulse 71, temperature 98.4 F (36.9 C), temperature source Oral, resp. rate 18, height 5\' 6"  (1.676 m), weight 108.4 kg, SpO2 98 %. Temp:  [98.4 F (36.9 C)-99.1 F (37.3 C)] 98.4 F (36.9 C) (05/30 1300) Pulse Rate:  [67-77] 71 (05/30 0809) Resp:  [14-20] 18 (05/30 1300) BP: (99-140)/(56-77) 128/66 (05/30 0809) SpO2:   [93 %-98 %] 98 % (05/30 1300) Weight:  [108.4 kg] 108.4 kg (05/30 0400)  Physical Exam: General: Alert and awake, oriented x3, not in any acute distress.appears tirerd HEENT: anicteric sclera, EOMI CVS RRR. No mgr Chest: , no wheezing, no respiratory distress, CT with serosanguinous drainage Abdomen: soft non-distended,  Skin: no rashes Neuro: nonfocal  CBC:    BMET Recent Labs    01/06/20 0621 01/08/20 0250  NA 144 143  K 4.3 4.4  CL 108 108  CO2 28 27  GLUCOSE 131* 142*  BUN 31* 24*  CREATININE 1.73* 1.61*  CALCIUM 8.0* 8.3*     Liver Panel  Recent Labs    01/06/20 0621  PROT 6.8  ALBUMIN 2.0*  AST 44*  ALT 89*  ALKPHOS 69  BILITOT 0.6       Sedimentation Rate No results for input(s): ESRSEDRATE in the last 72 hours. C-Reactive Protein No results for input(s): CRP in the last 72 hours.  Micro Results: Recent Results (from the past 720 hour(s))  Urine Culture     Status: None   Collection Time: 12/15/19 11:12 AM   Specimen: Urine, Clean Catch  Result Value Ref Range Status   Specimen Description   Final    URINE, CLEAN CATCH Performed at [facility], [place] [address] [place], [place] [address]    Special Requests   Final    NONE Performed at [facility], [place] [address] [place], [place] [address]    Culture   Final    NO GROWTH Performed at [facility], [place] [address] [place], [place] [address]    Report Status 12/16/2019 FINAL  Final  Culture, blood (Routine X 2) w Reflex to ID Panel     Status: None   Collection Time: 12/15/19 11:33 AM   Specimen: BLOOD  Result Value Ref Range Status   Specimen Description   Final    BLOOD RIGHT ANTECUBITAL Performed at [place] [address] [place], [place] [address]    Special Requests   Final    BOTTLES DRAWN AEROBIC AND ANAEROBIC Blood Culture adequate volume Performed at [place] [address] [place], [place] [address]    Culture   Final    NO GROWTH 5 DAYS Performed at [facility], [place] [address] [place], [place] [address]    Report Status 12/20/2019 FINAL  Final  Culture, blood (Routine X 2) w Reflex to ID Panel  Status: None   Collection Time: 12/15/19 11:53 AM   Specimen: BLOOD  Result Value Ref Range Status   Specimen Description   Final    BLOOD LEFT ANTECUBITAL Performed at [place] [address] [place], [place] [address]    Special Requests   Final    BOTTLES DRAWN AEROBIC AND ANAEROBIC Blood Culture adequate volume Performed at [place] [address] [place], [place] [address]    Culture   Final    NO GROWTH 5 DAYS Performed at [facility], [place] [address], [place], [place] [address]    Report Status 12/20/2019 FINAL  Final  Respiratory Panel by RT PCR (Flu A&B, Covid) - Nasopharyngeal Swab     Status: None   Collection Time: 12/15/19  3:36 PM   Specimen: Nasopharyngeal Swab  Result Value Ref Range Status   SARS Coronavirus 2 by RT PCR NEGATIVE NEGATIVE Final    Comment: (NOTE) SARS-CoV-2 target nucleic acids are NOT DETECTED. The SARS-CoV-2 RNA is generally detectable in upper respiratoy specimens during the acute phase of infection. The lowest concentration of SARS-CoV-2 viral copies this assay can detect is 131 copies/mL. A negative result does not preclude SARS-Cov-2 infection and should not be used as the sole basis for treatment or other patient management decisions. A negative result may occur with  improper specimen collection/handling, submission of specimen other than nasopharyngeal swab, presence of viral mutation(s) within the areas targeted by this assay, and inadequate number of viral copies (<131 copies/mL). A negative result must be combined with clinical observations, patient history, and epidemiological information. The expected result is Negative. Fact Sheet for  Patients:  PinkCheek.be Fact Sheet for Healthcare Providers:  GravelBags.it This test is not yet ap proved or cleared by the [place] FDA and  has been authorized for detection and/or diagnosis of SARS-CoV-2 by FDA under an Emergency Use Authorization (EUA). This EUA will remain  in effect (meaning this test can be used) for the duration of the COVID-19 declaration under Section 564(b)(1) of the Act, 21 U.S.C. section 360bbb-3(b)(1), unless the authorization is terminated or revoked sooner.    Influenza A by PCR NEGATIVE NEGATIVE Final   Influenza B by PCR NEGATIVE NEGATIVE Final    Comment: (NOTE) The Xpert Xpress SARS-CoV-2/FLU/RSV assay is intended as an aid in  the diagnosis of influenza from Nasopharyngeal swab specimens and  should not be used as a sole basis for treatment. Nasal washings and  aspirates are unacceptable for Xpert Xpress SARS-CoV-2/FLU/RSV  testing. Fact Sheet for Patients: PinkCheek.be Fact Sheet for Healthcare Providers: GravelBags.it This test is not yet approved or cleared by the [place] FDA and  has been authorized for detection and/or diagnosis of SARS-CoV-2 by  FDA under an Emergency Use Authorization (EUA). This EUA will remain  in effect (meaning this test can be used) for the duration of the  Covid-19 declaration under Section 564(b)(1) of the Act, 21  U.S.C. section 360bbb-3(b)(1), unless the authorization is  terminated or revoked. Performed at [facility], [place] [address] [place], [place] [address]   SARS Coronavirus 2 by RT PCR (hospital order, performed in [facility] hospital lab) Nasopharyngeal Nasopharyngeal Swab     Status: None   Collection Time: 12/27/19  4:44 AM   Specimen: Nasopharyngeal Swab  Result Value Ref Range Status   SARS Coronavirus 2 NEGATIVE NEGATIVE Final    Comment:  (NOTE) SARS-CoV-2 target nucleic acids are NOT DETECTED. The  SARS-CoV-2 RNA is generally detectable in upper and lower respiratory specimens during the acute phase of infection. The lowest concentration of SARS-CoV-2 viral copies this assay can detect is 250 copies / mL. A negative result does not preclude SARS-CoV-2 infection and should not be used as the sole basis for treatment or other patient management decisions.  A negative result may occur with improper specimen collection / handling, submission of specimen other than nasopharyngeal swab, presence of viral mutation(s) within the areas targeted by this assay, and inadequate number of viral copies (<250 copies / mL). A negative result must be combined with clinical observations, patient history, and epidemiological information. Fact Sheet for Patients:   StrictlyIdeas.no Fact Sheet for Healthcare Providers: BankingDealers.co.za This test is not yet approved or cleared  by the [place] FDA and has been authorized for detection and/or diagnosis of SARS-CoV-2 by FDA under an Emergency Use Authorization (EUA).  This EUA will remain in effect (meaning this test can be used) for the duration of the COVID-19 declaration under Section 564(b)(1) of the Act, 21 U.S.C. section 360bbb-3(b)(1), unless the authorization is terminated or revoked sooner. Performed at [facility], [place] [address] [place], [place] [address]   Urine culture     Status: None   Collection Time: 12/27/19  4:44 AM   Specimen: Urine, Clean Catch  Result Value Ref Range Status   Specimen Description   Final    URINE, CLEAN CATCH Performed at [facility], [place] [address], [place], [place] [address]    Special Requests   Final    NONE Performed at [facility], [place] [address] [place], [place] [address]    Culture   Final    NO GROWTH Performed at [facility], [place] [address] [place], [place] [address]    Report Status 12/28/2019 FINAL  Final  Blood culture (routine x 2)     Status: None   Collection Time: 12/27/19  7:01 AM   Specimen: BLOOD  Result Value Ref Range Status   Specimen Description   Final    BLOOD RIGHT ANTECUBITAL Performed at [place] [address] [place], [place] [address]    Special Requests   Final    BOTTLES DRAWN AEROBIC AND ANAEROBIC Blood Culture adequate volume Performed at [place] [address], [place], [place] [address]    Culture   Final    NO GROWTH 5 DAYS Performed at [facility], [place] [address] [place], [place] [address]    Report Status 01/01/2020 FINAL  Final  Blood culture (routine x 2)     Status: None   Collection Time: 12/27/19  7:27 AM   Specimen: BLOOD  Result Value Ref Range Status   Specimen Description   Final    BLOOD LEFT ANTECUBITAL Performed at [place] [address] [place], [place] [address]    Special Requests   Final    BOTTLES DRAWN AEROBIC AND ANAEROBIC Blood Culture adequate volume Performed at [place] [address] [place], [place] [address]    Culture   Final    NO GROWTH 5 DAYS Performed at [facility], [place] [address], [place],  [address]    Report Status 01/01/2020 FINAL  Final  Culture, blood (routine x 2)     Status: None   Collection Time: 12/29/19 10:06 AM   Specimen: BLOOD  Result Value Ref  Range Status   Specimen Description   Final    BLOOD RIGHT ARM Performed at [place] [address], [place], [place] [address]    Special Requests   Final    BOTTLES DRAWN AEROBIC AND ANAEROBIC Blood Culture adequate volume Performed at [place] [address] [place], [place] [address]    Culture   Final    NO GROWTH 5 DAYS Performed at [facility], [place] [address],  [place], [place] [address]    Report Status 01/03/2020 FINAL  Final  Culture, blood (routine x 2)     Status: None   Collection Time: 12/29/19 10:07 AM   Specimen: BLOOD  Result Value Ref Range Status   Specimen Description   Final    BLOOD RIGHT ARM Performed at [place] [address], [place], [place] [address]    Special Requests   Final    BOTTLES DRAWN AEROBIC AND ANAEROBIC Blood Culture adequate volume Performed at [place] [address], [place], [place] [address]    Culture   Final    NO GROWTH 5 DAYS Performed at [facility], [place] [address] [place], [place] [address]    Report Status 01/03/2020 FINAL  Final  MRSA PCR Screening     Status: None   Collection Time: 12/30/19  1:14 PM   Specimen: Nasal Mucosa; Nasopharyngeal  Result Value Ref Range Status   MRSA by PCR NEGATIVE NEGATIVE Final    Comment:        The GeneXpert MRSA Assay (FDA approved for NASAL specimens only), is one component of a comprehensive MRSA colonization surveillance program. It is not intended to diagnose MRSA infection nor to guide or monitor treatment for MRSA infections. Performed at [facility], [place] [address], [place], [place] [address]   Aerobic/Anaerobic Culture (surgical/deep wound)     Status: None   Collection Time: 01/01/20  3:14 PM   Specimen: Abscess  Result Value Ref Range Status   Specimen Description   Final    ABSCESS Performed at [place] [address], [place], [place] [address]    Special Requests   Final    RIGHT RENAL CYST Performed at [place] [address] [place] [address] [place], [place] [address]    Gram Stain   Final    FEW WBC PRESENT, PREDOMINANTLY PMN NO ORGANISMS SEEN    Culture   Final    FEW ESCHERICHIA COLI NO ANAEROBES ISOLATED Performed at [facility], [place] [address] [place], [place] [address]    Report Status 01/06/2020 FINAL   Final   Organism ID, Bacteria ESCHERICHIA COLI  Final      Susceptibility   Escherichia coli - MIC*    AMPICILLIN >=32 RESISTANT Resistant     CEFAZOLIN >=64 RESISTANT Resistant     CEFEPIME <=1 SENSITIVE Sensitive     CEFTAZIDIME <=1 SENSITIVE Sensitive     CEFTRIAXONE <=1 SENSITIVE Sensitive     CIPROFLOXACIN <=0.25 SENSITIVE Sensitive     GENTAMICIN <=1 SENSITIVE Sensitive     IMIPENEM <=0.25 SENSITIVE Sensitive     TRIMETH/SULFA <=20 SENSITIVE Sensitive     AMPICILLIN/SULBACTAM >=32 RESISTANT Resistant     PIP/TAZO 8 SENSITIVE Sensitive     * FEW ESCHERICHIA COLI  Culture, blood (Routine X 2) w Reflex to ID Panel     Status: None (Preliminary result)   Collection Time: 01/04/20  10:58 AM   Specimen: BLOOD  Result Value Ref Range Status   Specimen Description   Final    BLOOD LEFT ARM Performed at [place] [address] [place], [place] [address]    Special Requests   Final    BOTTLES DRAWN AEROBIC AND ANAEROBIC Blood Culture adequate volume Performed at [place] [address], [place], [place] [address]    Culture   Final    NO GROWTH 4 DAYS Performed at [facility], [place] [address], [place], [place] [address]    Report Status PENDING  Incomplete  Culture, blood (Routine X 2) w Reflex to ID Panel     Status: None (Preliminary result)   Collection Time: 01/04/20 10:58 AM   Specimen: BLOOD  Result Value Ref Range Status   Specimen Description   Final    BLOOD LEFT ARM Performed at [place] [address], [place], [place] [address]    Special Requests   Final    BOTTLES DRAWN AEROBIC AND ANAEROBIC Blood Culture adequate volume Performed at [place] [address], [place], [place] [address]    Culture   Final    NO GROWTH 4 DAYS Performed at [facility], [place] [address], [place], [place] [address]    Report Status PENDING  Incomplete  Body fluid culture      Status: None (Preliminary result)   Collection Time: 01/05/20  3:38 PM   Specimen: Lung, Right; Pleural Fluid  Result Value Ref Range Status   Specimen Description   Final    PLEURAL Performed at [place] [address], [place], [place] [address]    Special Requests   Final    NONE Performed at [facility], [place] [address], [place], [place] [address]    Gram Stain   Final    FEW WBC PRESENT, PREDOMINANTLY PMN NO ORGANISMS SEEN    Culture   Final    NO GROWTH 3 DAYS Performed at [place] [address] [place], [place] [address]    Report Status PENDING  Incomplete    Studies/Results: IR Guided Drain W Catheter Placement  Result Date: 01/07/2020 INDICATION: 64 year old with complex right pleural effusion. Request for chest tube placement. EXAM: IMAGE GUIDED RIGHT CHEST TUBE PLACEMENT MEDICATIONS: Moderate sedation ANESTHESIA/SEDATION: Fentanyl 50 mcg IV; Versed 1.0 mg IV Moderate Sedation Time:  18 minutes The patient was continuously monitored during the procedure by the interventional radiology nurse under my direct supervision. COMPLICATIONS: None immediate. PROCEDURE: Informed written consent was obtained from the patient's wife after a thorough discussion of the procedural risks, benefits and alternatives. All questions were addressed. Maximal Sterile Barrier Technique was utilized including caps, mask, sterile gowns, sterile gloves, sterile drape, hand hygiene and skin antiseptic. A timeout was performed prior to the initiation of the procedure. Patient was rolled on his left side. Right chest was evaluated with ultrasound. Adequate pleural pocket was identified with ultrasound in the right mid axillary region. The right side of the chest was prepped and draped in sterile fashion. Skin was anesthetized with 1% lidocaine. Small incision was made. Using ultrasound guidance, a Yueh catheter was directed into the pleural space and  yellow pleural fluid was aspirated. Superstiff Amplatz wire was advanced into the pleural space and the tract was dilated to accommodate a 14 [place] multipurpose drain. Catheter was sutured to skin and attached to a PleurEvac collecting  system. Fluoroscopic and ultrasound images were taken and saved for documentation. FINDINGS: Right pleural fluid has some loculations. 26 French drain was placed within the right pleural collection. Yellow fluid was draining from the chest tube at the end of the procedure. IMPRESSION: Successful placement of right chest tube using image guidance. Electronically Signed   By: Markus Daft M.D.   On: 01/07/2020 11:35   DG Chest Port 1 View  Result Date: 01/08/2020 CLINICAL DATA:  Weakness. EXAM: PORTABLE CHEST 1 VIEW COMPARISON:  Jan 06, 2020 FINDINGS: A right pigtail catheter remains on the right, repositioned. The distal tip of the catheter only partially overlies remaining fluid. There is a right-sided pleural effusion which is smaller in the interval with underlying opacity. No pneumothorax. No other acute interval changes. IMPRESSION: The right-sided pleural effusion is smaller in the interval. The right chest tube is been repositioned. The distal pigtail only partially overlies remaining fluid. No other changes. Electronically Signed   By: Dorise Bullion III M.D   On: 01/08/2020 12:42      Assessment/Plan:  INTERVAL HISTORY: Patient is status post chest tube placement into loculated right pleural effusion and going to start thrombolytics   Principal Problem:   Fever Active Problems:   CAD (coronary artery disease)   Atrial fibrillation ([place])   Essential hypertension   Chronic kidney disease   Morbid obesity due to excess calories (HCC)   Chronic pain   Type 2 diabetes mellitus with stage 3 chronic kidney disease, without long-term current use of insulin (HCC)   OSA (obstructive sleep apnea)   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HCC)   Diabetic polyneuropathy  associated with type 2 diabetes mellitus (HCC)   Acute respiratory failure with hypoxia (HCC)   Chronic combined systolic and diastolic CHF (congestive heart failure) (HCC)   Hyperlipidemia   Urethral stricture   Nephrolithiasis   Complex renal cyst    Rabih Pasqual is a 64 y.o. male with with multiple hospitalizations with presumed pyelonephritis on chronic corticosteroids for psoriatic arthritis found to have an infected renal cyst that grew E. coli from culture.  Has had persistent fevers since then but found to have significant pleural effusions that have been drained and now with chest tube placement.  Fevers seem to have defervesced after chest tube placement overnight.  #1 Renal abscess:  Continue ceftriaxone while in the hospital.  If drain remains in place at discharge would switch him to oral Bactrim twice daily.   #2 Loculated pleural effusion: Initiate thrombolytics with CT surgery.  If this fails will undergo VATS  #3 Fevers: These seem to have defervesced after Chest tube placement  I will sign off for now  Please call with further questions for example if his renal drain is still in place at time of DC to plan for duration and followup.    LOS: 12 days   Alcide Evener 01/08/2020, 1:18 PM

## 2020-01-09 LAB — BASIC METABOLIC PANEL
Anion gap: 12 (ref 5–15)
BUN: 23 mg/dL (ref 8–23)
CO2: 25 mmol/L (ref 22–32)
Calcium: 8.3 mg/dL — ABNORMAL LOW (ref 8.9–10.3)
Chloride: 106 mmol/L (ref 98–111)
Creatinine, Ser: 1.52 mg/dL — ABNORMAL HIGH (ref 0.61–1.24)
GFR calc Af Amer: 55 mL/min — ABNORMAL LOW (ref >60)
GFR calc non Af Amer: 48 mL/min — ABNORMAL LOW (ref >60)
Glucose, Bld: 143 mg/dL — ABNORMAL HIGH (ref 70–99)
Potassium: 4.5 mmol/L (ref 3.5–5.1)
Sodium: 143 mmol/L (ref 135–145)

## 2020-01-09 LAB — GLUCOSE, CAPILLARY
Glucose-Capillary: 101 mg/dL — ABNORMAL HIGH (ref 70–99)
Glucose-Capillary: 117 mg/dL — ABNORMAL HIGH (ref 70–99)
Glucose-Capillary: 137 mg/dL — ABNORMAL HIGH (ref 70–99)
Glucose-Capillary: 147 mg/dL — ABNORMAL HIGH (ref 70–99)
Glucose-Capillary: 151 mg/dL — ABNORMAL HIGH (ref 70–99)
Glucose-Capillary: 153 mg/dL — ABNORMAL HIGH (ref 70–99)

## 2020-01-09 LAB — CULTURE, BLOOD (ROUTINE X 2)
Culture: NO GROWTH
Culture: NO GROWTH
Special Requests: ADEQUATE
Special Requests: ADEQUATE

## 2020-01-09 LAB — CBC
HCT: 24.3 % — ABNORMAL LOW (ref 39.0–52.0)
Hemoglobin: 7.7 g/dL — ABNORMAL LOW (ref 13.0–17.0)
MCH: 28.4 pg (ref 26.0–34.0)
MCHC: 31.7 g/dL (ref 30.0–36.0)
MCV: 89.7 fL (ref 80.0–100.0)
Platelets: UNDETERMINED 10*3/uL (ref 150–400)
RBC: 2.71 MIL/uL — ABNORMAL LOW (ref 4.22–5.81)
RDW: 16.9 % — ABNORMAL HIGH (ref 11.5–15.5)
WBC: 14.6 10*3/uL — ABNORMAL HIGH (ref 4.0–10.5)
nRBC: 0.1 % (ref 0.0–0.2)

## 2020-01-09 LAB — MAGNESIUM: Magnesium: 1.9 mg/dL (ref 1.7–2.4)

## 2020-01-09 LAB — BODY FLUID CULTURE: Culture: NO GROWTH

## 2020-01-09 MED ORDER — SODIUM CHLORIDE (PF) 0.9 % IJ SOLN
10.0000 mg | Freq: Two times a day (BID) | INTRAMUSCULAR | Status: AC
  Administered 2020-01-09 – 2020-01-10 (×2): 10 mg via INTRAPLEURAL
  Filled 2020-01-09 (×7): qty 10

## 2020-01-09 MED ORDER — STERILE WATER FOR INJECTION IJ SOLN
5.0000 mg | Freq: Two times a day (BID) | RESPIRATORY_TRACT | Status: AC
  Administered 2020-01-09 – 2020-01-10 (×2): 5 mg via INTRAPLEURAL
  Filled 2020-01-09 (×7): qty 5

## 2020-01-09 NOTE — Progress Notes (Signed)
     [address]       [place] [address]             [phone]       No events overnight, 86 mL drained out from the chest tube. Chest x-ray is pending I will start thrombolytics today.

## 2020-01-09 NOTE — Progress Notes (Signed)
      [address]       [place] [address]             [phone]    Procedure: Thrombolytics insertion into pigtail chest tube   Verbal consent obtained  Pre Procedure:  Stable. Patient without complaint.   Procedure:  Patient was placed in bed in a semi-reclined reposition. Alcohol swab was used to clean the insertion site on the pigtail catheter and distal end of the catheter was clamped. 10ml of Alteplase solution followed by 73ml of dornase inserted into the chest tube. Then, after a few minutes I reconnected the chest tube. I removed some fibrinous material before reconnecting and both sites were cleaned with an alcohol swab.   Post Procedure:  Patient tolerated the procedure well. Only complaint is that he is cold. Patient was given three warming blankets and felt better.   Complications: none  Nicholes Rough, [name]

## 2020-01-09 NOTE — Progress Notes (Signed)
PROGRESS NOTE    Marco [name]  [id number] DOB: 1956/07/26 DOA: 12/27/2019 PCP: [name], MD   Brief Narrative:  64 y.o. male with a history of systolic and diastolic congestive heart failure, nonischemic cardiomyopathy, chronic kidney disease stage III, diabetes mellitus type 2, coronary artery disease, hypertension, obesity, obstructive sleep apnea, atrial fibrillation status post ablation 2017, psoriatic arthritis on daily prednisone. Patient presented secondary to lethargy and weakness and found to have evidence of sepsis from recurrent UTI vs prostatitis from recent UTI. Patient developed respiratory and hemodynamic failure requiring ICU admission, intubation/mechanical ventilation and vasopressor support. While admitted, he has had recurrent/persistent fevers on antibiotics. Infectious disease is on board. Respiratory status improved and he was extubated on 5/25 successfully. CT chest concerning for right loculated fluid which is possibly secondary to infection.    Assessment & Plan:   Principal Problem:   Acute febrile illness Active Problems:   CAD (coronary artery disease)   Atrial fibrillation (HCC)   Essential hypertension   Chronic kidney disease   Morbid obesity due to excess calories (HCC)   Chronic pain   Type 2 diabetes mellitus with stage 3 chronic kidney disease, without long-term current use of insulin (HCC)   OSA (obstructive sleep apnea)   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HCC)   Diabetic polyneuropathy associated with type 2 diabetes mellitus (HCC)   Acute respiratory failure with hypoxia (HCC)   Chronic combined systolic and diastolic CHF (congestive heart failure) (HCC)   Hyperlipidemia   Urethral stricture   Nephrolithiasis   Complex renal cyst   Elevated LFTs   Pleural effusion   Weakness   Loculated pleural effusion   Sepsis secondary to right-sided pyelonephritis with infected cyst Right-sided loculated effusion -Continue IV Rocephin, changed to  Bactrim if feeling gets discharged with chest tube per ID -CT surgery following. -Right-sided chest tube placed 5/29-management per IR and CT surgery.  About 80 cc out last 24 hours.  Plans for thrombolytic infusion into chest cavity today.  If these remain unsuccessful he will require decortication -Status post thoracentesis 5/27- only yielding 25cc.  -CT-guided drain placed in the right renal cyst 5/23 -Follow fluid cultures.  Renal cyst cultures grew E. coli-MDR -Urology and ID signed off.  If spikes fever, repeat cultures  Acute respiratory failure with hypoxemia Extubated 5/25.  Supplemental oxygen for now supportive care.  Left maxillary sinus disease Chronic, incidental finding.  Noninfectious source per ID.  AKI on CKD stage IIIa Baseline creatinine of 1.5. Peak creatinine of 2.13 . Today 1.52  Chronic combined systolic and diastolic heart failure EF of 50-55% which is improved from last year. Grade 1 diastolic dysfunction noted.  Continue to monitor fluid status Continue ASA, Coreg  Rheumatoid arthritis Prednisone, Oxycodone.  Generalized pain -Continue Lyrica and Cymbalta. Titrate Lyrica for renal function  Anemia of chronic disease Hb ~ 8.3 In setting of kidney disease. Stable.  Hemoglobin today 7.7  Elevated LFTs; improving.  Likely secondary to hypotension which was noted from 5/23-5/24. RUQ ultrasound obtained with fatty infiltration of the liver. Hepatitis panel negative.  Gout Patient is on allopurinol which has been held secondary to AKI  Pressure injury Left/Right buttock. POA  DVT prophylaxis: None Code Status: Full Family Communication:  Mrs Hitsman updated.   Status is: Inpatient  Remains inpatient appropriate because:Hemodynamically unstable   Dispo: The patient is from: Home              Anticipated d/c is to: To be determined  Anticipated d/c date is: > 3 days              Patient currently is not medically stable to  d/c.  Having recurrent fevers has loculated effusion which requires further intervention.  Unsafe for discharge  Radiology studies-  CT head 5/18-negative for acute pathology.  Chronic sinusitis  Ultrasound pelvis 5/18-normal  CT abdomen pelvis without contrast 5/19-small bilateral pleural effusion, atelectasis, right-sided renal cyst/concerns for pyelonephritis  MRI brain without contrast 5/21-no acute findings.  Chronic sinusitis  Echo 5/22-EF 50-55%, grade 1 DD hypokinesia of the distal septum/apex  EKG 5/24-diffuse severe encephalopathy but no seizures  MRI pelvis 5/24-negative for acute findings  Procedures:   Intubated 5/23, extubated 5/25   Subjective: Feels okay no complaints besides pain at the chest tube insertion site.  Review of Systems Otherwise negative except as per HPI, including: General: Denies fever, chills, night sweats or unintended weight loss. Resp: Denies cough, wheezing, shortness of breath. Cardiac: Denies chest pain, palpitations, orthopnea, paroxysmal nocturnal dyspnea. GI: Denies abdominal pain, nausea, vomiting, diarrhea or constipation GU: Denies dysuria, frequency, hesitancy or incontinence MS: Denies muscle aches, joint pain or swelling Neuro: Denies headache, neurologic deficits (focal weakness, numbness, tingling), abnormal gait Psych: Denies anxiety, depression, SI/HI/AVH Skin: Denies new rashes or lesions ID: Denies sick contacts, exotic exposures, travel  Examination:  Constitutional: Not in acute distress Respiratory: Diminished breath sounds at the right lung base Cardiovascular: Normal sinus rhythm, no rubs Abdomen: Nontender nondistended good bowel sounds Musculoskeletal: No edema noted Skin: No rashes seen Neurologic: CN 2-12 grossly intact.  And nonfocal Psychiatric: Normal judgment and insight. Alert and oriented x 3. Normal mood. Right-sided drain noted. Right chest wall, chest tube noted. Left upper extremity midline in  place  Objective: Vitals:   01/09/20 0032 01/09/20 0419 01/09/20 0800 01/09/20 1003  BP: (!) 124/56 127/60 129/67   Pulse: 69 67 67 69  Resp: 17 13 15    Temp: 98.3 F (36.8 C) 98.6 F (37 C)    TempSrc: Oral Oral    SpO2: 94% 98% 96%   Weight:  107.8 kg    Height:        Intake/Output Summary (Last 24 hours) at 01/09/2020 1014 Last data filed at 01/09/2020 0600 Gross per 24 hour  Intake 120 ml  Output 851 ml  Net -731 ml   Filed Weights   01/07/20 0410 01/08/20 0400 01/09/20 0419  Weight: 106.3 kg 108.4 kg 107.8 kg     Data Reviewed:   CBC: Recent Labs  Lab 01/03/20 0444 01/03/20 0444 01/04/20 0747 01/05/20 0240 01/06/20 0621 01/08/20 0250 01/09/20 0256  WBC 19.0*   < > 18.2* 16.3* 13.4* 14.2* 14.6*  NEUTROABS 14.5*  --   --   --  9.2*  --   --   HGB 8.5*   < > 8.1* 8.9* 8.0* 7.5* 7.7*  HCT 27.7*   < > 26.0* 29.6* 25.7* 24.0* 24.3*  MCV 92.0   < > 91.9 93.7 91.8 90.9 89.7  PLT 355   < > 360 342 454* 460* PLATELET CLUMPS NOTED ON SMEAR, UNABLE TO ESTIMATE   < > = values in this interval not displayed.   Basic Metabolic Panel: Recent Labs  Lab 01/02/20 1325 01/02/20 1325 01/02/20 1649 01/02/20 1649 01/03/20 0444 01/03/20 1348 01/04/20 0747 01/05/20 0240 01/06/20 0621 01/08/20 0250 01/09/20 0256  NA  --   --   --    < > 146*  --  143 144 144  143 143  K  --   --   --   --  3.2*   < > 3.7 3.9 4.3 4.4 4.5  CL  --   --   --    < > 111  --  108 106 108 108 106  CO2  --   --   --    < > 27  --  27 23 28 27 25   GLUCOSE  --   --   --    < > 186*  --  116* 110* 131* 142* 143*  BUN  --   --   --    < > 44*  --  46* 34* 31* 24* 23  CREATININE  --   --   --    < > 2.13*  --  2.09* 1.65* 1.73* 1.61* 1.52*  CALCIUM  --   --   --    < > 7.8*  --  7.9* 8.0* 8.0* 8.3* 8.3*  MG 2.0   < > 2.1   < > 2.2  --  2.3  --  2.2 1.9 1.9  PHOS 1.9*  --  1.9*  --  2.4*  --  2.5  --  2.5  --   --    < > = values in this interval not displayed.   GFR: Estimated Creatinine  Clearance: 56.5 mL/min (A) (by C-G formula based on SCr of 1.52 mg/dL (H)). Liver Function Tests: Recent Labs  Lab 01/03/20 0444 01/04/20 1532 01/05/20 0240 01/06/20 0621  AST 67*  --  146* 44*  ALT 67*  --  143* 89*  ALKPHOS 56  --  76 69  BILITOT 1.0  --  0.8 0.6  PROT 6.6 8.6* 7.0 6.8  ALBUMIN 2.2*  --  2.2* 2.0*   No results for input(s): LIPASE, AMYLASE in the last 168 hours. No results for input(s): AMMONIA in the last 168 hours. Coagulation Profile: No results for input(s): INR, PROTIME in the last 168 hours. Cardiac Enzymes: No results for input(s): CKTOTAL, CKMB, CKMBINDEX, TROPONINI in the last 168 hours. BNP (last 3 results) No results for input(s): PROBNP in the last 8760 hours. HbA1C: No results for input(s): HGBA1C in the last 72 hours. CBG: Recent Labs  Lab 01/08/20 1632 01/08/20 2008 01/09/20 0035 01/09/20 0426 01/09/20 0748  GLUCAP 168* 153* 137* 117* 101*   Lipid Profile: No results for input(s): CHOL, HDL, LDLCALC, TRIG, CHOLHDL, LDLDIRECT in the last 72 hours. Thyroid Function Tests: No results for input(s): TSH, T4TOTAL, FREET4, T3FREE, THYROIDAB in the last 72 hours. Anemia Panel: No results for input(s): VITAMINB12, FOLATE, FERRITIN, TIBC, IRON, RETICCTPCT in the last 72 hours. Sepsis Labs: Recent Labs  Lab 01/03/20 1348 01/04/20 1058 01/05/20 0240 01/06/20 0621  PROCALCITON  --  2.60 1.47 0.90  LATICACIDVEN 2.4*  --   --   --     Recent Results (from the past 240 hour(s))  MRSA PCR Screening     Status: None   Collection Time: 12/30/19  1:14 PM   Specimen: Nasal Mucosa; Nasopharyngeal  Result Value Ref Range Status   MRSA by PCR NEGATIVE NEGATIVE Final    Comment:        The GeneXpert MRSA Assay (FDA approved for NASAL specimens only), is one component of a comprehensive MRSA colonization surveillance program. It is not intended to diagnose MRSA infection nor to guide or monitor treatment for MRSA infections. Performed at  [facility], [place]  [address], [place], [place] [address]   Aerobic/Anaerobic Culture (surgical/deep wound)     Status: None   Collection Time: 01/01/20  3:14 PM   Specimen: Abscess  Result Value Ref Range Status   Specimen Description   Final    ABSCESS Performed at [facility] [address], [place], [place] [address]    Special Requests   Final    RIGHT RENAL CYST Performed at [facility] [address], [place], [place] [address]    Gram Stain   Final    FEW WBC PRESENT, PREDOMINANTLY PMN NO ORGANISMS SEEN    Culture   Final    FEW ESCHERICHIA COLI NO ANAEROBES ISOLATED Performed at [facility], [place] [address] [place], [place] [address]    Report Status 01/06/2020 FINAL  Final   Organism ID, Bacteria ESCHERICHIA COLI  Final      Susceptibility   Escherichia coli - MIC*    AMPICILLIN >=32 RESISTANT Resistant     CEFAZOLIN >=64 RESISTANT Resistant     CEFEPIME <=1 SENSITIVE Sensitive     CEFTAZIDIME <=1 SENSITIVE Sensitive     CEFTRIAXONE <=1 SENSITIVE Sensitive     CIPROFLOXACIN <=0.25 SENSITIVE Sensitive     GENTAMICIN <=1 SENSITIVE Sensitive     IMIPENEM <=0.25 SENSITIVE Sensitive     TRIMETH/SULFA <=20 SENSITIVE Sensitive     AMPICILLIN/SULBACTAM >=32 RESISTANT Resistant     PIP/TAZO 8 SENSITIVE Sensitive     * FEW ESCHERICHIA COLI  Culture, blood (Routine X 2) w Reflex to ID Panel     Status: None   Collection Time: 01/04/20 10:58 AM   Specimen: BLOOD  Result Value Ref Range Status   Specimen Description   Final    BLOOD LEFT ARM Performed at [facility] [address] [place], [place] [address]    Special Requests   Final    BOTTLES DRAWN AEROBIC AND ANAEROBIC Blood Culture adequate volume Performed at [facility] [address] [place], [place] [address]    Culture   Final    NO GROWTH 5 DAYS Performed at [facility], [place] [address] [place], [place] [address]    Report Status 01/09/2020 FINAL  Final  Culture, blood (Routine X 2) w Reflex to ID Panel     Status: None   Collection Time: 01/04/20 10:58 AM   Specimen: BLOOD  Result Value Ref Range Status   Specimen Description   Final    BLOOD LEFT ARM Performed at [facility] [address] [place], [place] [address]    Special Requests   Final    BOTTLES DRAWN AEROBIC AND ANAEROBIC Blood Culture adequate volume Performed at [facility] [address], [place], [place] [address]    Culture   Final    NO GROWTH 5 DAYS Performed at [facility], [place] [address], [place], [place] [address]    Report Status 01/09/2020 FINAL  Final  Body fluid culture     Status: None   Collection Time: 01/05/20  3:38 PM   Specimen: Lung, Right; Pleural Fluid  Result Value Ref Range Status   Specimen Description   Final    PLEURAL Performed at [facility] [address] [place], [place] [address]    Special Requests   Final    NONE Performed at [facility], [place] [address], [place],  [address]  Gram Stain   Final    FEW WBC PRESENT, PREDOMINANTLY PMN NO ORGANISMS SEEN    Culture   Final    NO GROWTH 3 DAYS Performed at [facility] [address], [place], [place] [address]    Report Status 01/09/2020 FINAL  Final         Radiology Studies: IR Guided Drain W Catheter Placement  Result Date: 01/07/2020 INDICATION: 64 year old with complex right pleural effusion. Request for chest tube placement. EXAM: IMAGE GUIDED RIGHT CHEST TUBE PLACEMENT MEDICATIONS: Moderate sedation ANESTHESIA/SEDATION: Fentanyl 50 mcg IV; Versed 1.0 mg IV Moderate Sedation Time:  18 minutes The patient was continuously monitored during the procedure by the interventional radiology nurse under my direct supervision. COMPLICATIONS: None immediate. PROCEDURE: Informed written consent was obtained  from the patient's wife after a thorough discussion of the procedural risks, benefits and alternatives. All questions were addressed. Maximal Sterile Barrier Technique was utilized including caps, mask, sterile gowns, sterile gloves, sterile drape, hand hygiene and skin antiseptic. A timeout was performed prior to the initiation of the procedure. Patient was rolled on his left side. Right chest was evaluated with ultrasound. Adequate pleural pocket was identified with ultrasound in the right mid axillary region. The right side of the chest was prepped and draped in sterile fashion. Skin was anesthetized with 1% lidocaine. Small incision was made. Using ultrasound guidance, a Yueh catheter was directed into the pleural space and yellow pleural fluid was aspirated. Superstiff Amplatz wire was advanced into the pleural space and the tract was dilated to accommodate a 14 [place] multipurpose drain. Catheter was sutured to skin and attached to a PleurEvac collecting system. Fluoroscopic and ultrasound images were taken and saved for documentation. FINDINGS: Right pleural fluid has some loculations. 67 French drain was placed within the right pleural collection. Yellow fluid was draining from the chest tube at the end of the procedure. IMPRESSION: Successful placement of right chest tube using image guidance. Electronically Signed   By: Markus Daft M.D.   On: 01/07/2020 11:35   DG Chest Port 1 View  Result Date: 01/08/2020 CLINICAL DATA:  Weakness. EXAM: PORTABLE CHEST 1 VIEW COMPARISON:  Jan 06, 2020 FINDINGS: A right pigtail catheter remains on the right, repositioned. The distal tip of the catheter only partially overlies remaining fluid. There is a right-sided pleural effusion which is smaller in the interval with underlying opacity. No pneumothorax. No other acute interval changes. IMPRESSION: The right-sided pleural effusion is smaller in the interval. The right chest tube is been repositioned. The distal pigtail  only partially overlies remaining fluid. No other changes. Electronically Signed   By: Dorise Bullion III M.D   On: 01/08/2020 12:42        Scheduled Meds: . aspirin EC  81 mg Oral QHS  . carvedilol  12.5 mg Oral BID WC  . Chlorhexidine Gluconate Cloth  6 each Topical Daily  . DULoxetine  20 mg Oral Daily  . famotidine  20 mg Oral QHS  . feeding supplement (ENSURE ENLIVE)  237 mL Oral Q24H  . feeding supplement (PRO-STAT SUGAR FREE 64)  30 mL Oral BID  . fluticasone  2 spray Each Nare Daily  . heparin injection (subcutaneous)  5,000 Units Subcutaneous Q8H  . insulin aspart  2-6 Units Subcutaneous Q4H  . mouth rinse  15 mL Mouth Rinse BID  . multivitamin with minerals  1 tablet Oral Daily  . nutrition supplement (JUVEN)  1 packet Oral BID BM  .  polyethylene glycol  17 g Oral Daily  . potassium chloride SA  20 mEq Oral Daily  . predniSONE  5 mg Oral Q breakfast  . pregabalin  100 mg Oral BID  . sodium chloride flush  10-40 mL Intracatheter Q12H  . sodium chloride flush  5 mL Intravenous Q8H   Continuous Infusions: . sodium chloride Stopped (12/30/19 1700)  . cefTRIAXone (ROCEPHIN)  IV 2 g (01/08/20 1244)     LOS: 13 days   Time spent= 35 mins    Markeeta Scalf Arsenio Loader, MD [facility]  If 7PM-7AM, please contact night-coverage  01/09/2020, 10:14 AM

## 2020-01-09 NOTE — Progress Notes (Addendum)
Physical Therapy Treatment Patient Details Name: Marco [name] MRN: [id number] DOB: July 25, 1956 Today's Date: 01/09/2020    History of Present Illness Patient is a 64 y/o male who presents with E-Coli right pyelonephritis.  Initially admitted with concern for sepsis with fever, leukocytosis and elevated lactic acid. Work up concerning for possible infected right renal cyst with associated right renal pyelonephritis.  He had progressive agitated delirium, fevers despite antibiotics.  IR drained renal cyst 5/23 with bloody fluid returned.  Ultimately, required intubation on 5/23 for agitated delirium / impending respiratory failure.  Extubated 5/25. s/p CT placement 5/29 with plan for thrombolytics.    PT Comments    Patient progressing well towards PT goals. Reports pain in bottom and on right side today. Motivated to get OOB. Improved ambulation distance with min guard assist for balance/safety and use of RW. Sp02 dropped to 85% on 1L/min 02 [place] with activity and reported dizziness; increased to 3L and 02 remained in mid-high 90s with activity. Will likely need 3L/min 02  with mobility/activity. Continues to require assist with standing/bed mobility. Limit sitting time in chair due to sore bottom. Rn aware. Able to perform pressure relief with chair push ups, encouraged to perform frequently. Will continue to follow and progress as tolerated.     Follow Up Recommendations  Home health PT;Supervision for mobility/OOB     Equipment Recommendations  Rolling walker with 5" wheels    Recommendations for Other Services       Precautions / Restrictions Precautions Precautions: Fall Precaution Comments: Right drain, chest tube to suction, sore bottom Restrictions Weight Bearing Restrictions: No    Mobility  Bed Mobility Overal bed mobility: Needs Assistance Bed Mobility: Rolling;Sidelying to Sit Rolling: Min guard Sidelying to sit: HOB elevated;Mod assist       General bed mobility  comments: Able to roll onto side but needed assist with trunk and scooting bottom to EOB.  Transfers Overall transfer level: Needs assistance Equipment used: Rolling walker (2 wheeled) Transfers: Sit to/from Stand Sit to Stand: Mod assist;From elevated surface;Min assist         General transfer comment: ASsist to power to standing with cues for hand placement/technique. Stood from [employer], from chair x1. Better able to stand from recliner.  Ambulation/Gait Ambulation/Gait assistance: Min guard;+2 safety/equipment Gait Distance (Feet): 14 Feet Assistive device: Rolling walker (2 wheeled) Gait Pattern/deviations: Step-through pattern;Decreased stride length;Trunk flexed Gait velocity: decreased   General Gait Details: Slow, mildly unsteady gait with 2/4 DOE. + dizziness, Sp02 dropped to 85% on 1L, increased to 3L and Sp02 remained >94%.   Stairs             Wheelchair Mobility    Modified Rankin (Stroke Patients Only)       Balance Overall balance assessment: Needs assistance Sitting-balance support: Feet supported;No upper extremity supported Sitting balance-Leahy Scale: Fair Sitting balance - Comments: supervision for safety.   Standing balance support: During functional activity Standing balance-Leahy Scale: Poor Standing balance comment: requires B UE support of walker and Min guard.                            Cognition Arousal/Alertness: Awake/alert Behavior During Therapy: Flat affect;Anxious Overall Cognitive Status: Within Functional Limits for tasks assessed                                 General Comments: Concerned/anxious about  taking thrombolytics and possible surgery.      Exercises Other Exercises Other Exercises: chair push ups x3    General Comments General comments (skin integrity, edema, etc.): Sp02 dropped to 85% on 1L/min 02 [place] with activity and dizziness, increased to 3L and 02 remained in mid-high 90s with  activity.      Pertinent Vitals/Pain Pain Assessment: Faces Faces Pain Scale: Hurts little more Pain Location: right side, bottom Pain Descriptors / Indicators: Discomfort;Sore;Grimacing;Guarding Pain Intervention(s): Repositioned;Monitored during session    Home Living                      Prior Function            PT Goals (current goals can now be found in the care plan section) Progress towards PT goals: Progressing toward goals    Frequency    Min 3X/week      PT Plan Current plan remains appropriate    Co-evaluation              AM-PAC PT "6 Clicks" Mobility   Outcome Measure  Help needed turning from your back to your side while in a flat bed without using bedrails?: A Little Help needed moving from lying on your back to sitting on the side of a flat bed without using bedrails?: A Lot Help needed moving to and from a bed to a chair (including a wheelchair)?: A Lot Help needed standing up from a chair using your arms (e.g., wheelchair or bedside chair)?: A Lot Help needed to walk in hospital room?: A Little Help needed climbing 3-5 steps with a railing? : A Lot 6 Click Score: 14    End of Session Equipment Utilized During Treatment: Gait belt Activity Tolerance: Patient limited by fatigue;Patient tolerated treatment well Patient left: in chair;with call bell/phone within reach Nurse Communication: Mobility status PT Visit Diagnosis: Difficulty in walking, not elsewhere classified (R26.2);Pain Pain - part of body: (right side; bottom)     Time: 1052-1110 PT Time Calculation (min) (ACUTE ONLY): 18 min  Charges:  $Therapeutic Activity: 8-22 mins                     Marco [name], PT, DPT Acute Rehabilitation Services Pager [phone] Office [phone]       Marco [name] 01/09/2020, 12:28 PM

## 2020-01-10 ENCOUNTER — Inpatient Hospital Stay (HOSPITAL_COMMUNITY): Payer: Medicaid Other

## 2020-01-10 LAB — CBC
HCT: 24.5 % — ABNORMAL LOW (ref 39.0–52.0)
Hemoglobin: 7.5 g/dL — ABNORMAL LOW (ref 13.0–17.0)
MCH: 27.9 pg (ref 26.0–34.0)
MCHC: 30.6 g/dL (ref 30.0–36.0)
MCV: 91.1 fL (ref 80.0–100.0)
Platelets: 431 10*3/uL — ABNORMAL HIGH (ref 150–400)
RBC: 2.69 MIL/uL — ABNORMAL LOW (ref 4.22–5.81)
RDW: 16.7 % — ABNORMAL HIGH (ref 11.5–15.5)
WBC: 11.7 10*3/uL — ABNORMAL HIGH (ref 4.0–10.5)
nRBC: 0.3 % — ABNORMAL HIGH (ref 0.0–0.2)

## 2020-01-10 LAB — BASIC METABOLIC PANEL
Anion gap: 10 (ref 5–15)
BUN: 18 mg/dL (ref 8–23)
CO2: 27 mmol/L (ref 22–32)
Calcium: 8.3 mg/dL — ABNORMAL LOW (ref 8.9–10.3)
Chloride: 104 mmol/L (ref 98–111)
Creatinine, Ser: 1.49 mg/dL — ABNORMAL HIGH (ref 0.61–1.24)
GFR calc Af Amer: 57 mL/min — ABNORMAL LOW (ref >60)
GFR calc non Af Amer: 49 mL/min — ABNORMAL LOW (ref >60)
Glucose, Bld: 133 mg/dL — ABNORMAL HIGH (ref 70–99)
Potassium: 4.5 mmol/L (ref 3.5–5.1)
Sodium: 141 mmol/L (ref 135–145)

## 2020-01-10 LAB — GLUCOSE, CAPILLARY
Glucose-Capillary: 103 mg/dL — ABNORMAL HIGH (ref 70–99)
Glucose-Capillary: 119 mg/dL — ABNORMAL HIGH (ref 70–99)
Glucose-Capillary: 132 mg/dL — ABNORMAL HIGH (ref 70–99)
Glucose-Capillary: 137 mg/dL — ABNORMAL HIGH (ref 70–99)
Glucose-Capillary: 137 mg/dL — ABNORMAL HIGH (ref 70–99)
Glucose-Capillary: 182 mg/dL — ABNORMAL HIGH (ref 70–99)

## 2020-01-10 LAB — CYTOLOGY - NON PAP

## 2020-01-10 LAB — MAGNESIUM: Magnesium: 1.7 mg/dL (ref 1.7–2.4)

## 2020-01-10 NOTE — Progress Notes (Addendum)
PROGRESS NOTE    Marco [name]  [id number] DOB: 10-Jul-1956 DOA: 12/27/2019 PCP: [name], MD   Brief Narrative:  64 y.o. male with a history of systolic and diastolic congestive heart failure, nonischemic cardiomyopathy, chronic kidney disease stage III, diabetes mellitus type 2, coronary artery disease, hypertension, obesity, obstructive sleep apnea, atrial fibrillation status post ablation 2017, psoriatic arthritis on daily prednisone. Patient presented secondary to lethargy and weakness and found to have evidence of sepsis from recurrent UTI vs prostatitis from recent UTI. Patient developed respiratory and hemodynamic failure requiring ICU admission, intubation/mechanical ventilation and vasopressor support. While admitted, he has had recurrent/persistent fevers on antibiotics. Infectious disease is on board. Respiratory status improved and he was extubated on 5/25 successfully. CT chest concerning for right loculated fluid which is possibly secondary to infection.  Chest tube placed 5/29, management per CT surgery.   Assessment & Plan:   Principal Problem:   Acute febrile illness Active Problems:   CAD (coronary artery disease)   Atrial fibrillation (HCC)   Essential hypertension   Chronic kidney disease   Morbid obesity due to excess calories (HCC)   Chronic pain   Type 2 diabetes mellitus with stage 3 chronic kidney disease, without long-term current use of insulin (HCC)   OSA (obstructive sleep apnea)   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HCC)   Diabetic polyneuropathy associated with type 2 diabetes mellitus (HCC)   Acute respiratory failure with hypoxia (HCC)   Chronic combined systolic and diastolic CHF (congestive heart failure) (HCC)   Hyperlipidemia   Urethral stricture   Nephrolithiasis   Complex renal cyst   Elevated LFTs   Pleural effusion   Weakness   Loculated pleural effusion   Sepsis secondary to right-sided pyelonephritis with infected cyst Right-sided  loculated effusion -Continue IV Rocephin, changed to Bactrim if feeling gets discharged with chest tube per ID -CT surgery following. -Right-sided chest tube placed 5/29-management per IR and CT surgery.  Status post first dose of thrombolytic 5/31, good response.  Continue to monitor output. -Status post thoracentesis 5/27- only yielding 25cc.  -CT-guided drain placed in the right renal cyst 5/23 -Follow fluid cultures.  Renal cyst cultures grew E. coli-MDR -Urology and ID signed off.  If spikes fever, repeat cultures  Acute respiratory failure with hypoxemia Extubated 5/25.  Remained stable.  Left maxillary sinus disease Chronic, incidental finding.  Noninfectious source per ID.  AKI on CKD stage IIIa Baseline creatinine of 1.5. Peak creatinine of 2.13 . Today 1.49  Chronic combined systolic and diastolic heart failure EF of 50-55% which is improved from last year. Grade 1 diastolic dysfunction noted.  Continue to monitor fluid status Continue ASA, Coreg  Rheumatoid arthritis Prednisone, Oxycodone.  Generalized pain -Continue Lyrica and Cymbalta. Titrate Lyrica for renal function  Anemia of chronic disease Hb ~ 8.3 In setting of kidney disease. Stable.  Hemoglobin today 7.7  Elevated LFTs; improving.  Likely secondary to hypotension which was noted from 5/23-5/24. RUQ ultrasound obtained with fatty infiltration of the liver. Hepatitis panel negative.  Gout Patient is on allopurinol which has been held secondary to AKI  Pressure injury Left/Right buttock. POA  DVT prophylaxis: None Code Status: Full Family Communication:  Mrs Zolnowski- updated.   Status is: Inpatient  Remains inpatient appropriate because:Hemodynamically unstable   Dispo: The patient is from: Home              Anticipated d/c is to: To be determined  Anticipated d/c date is: > 3 days              Patient currently is not medically stable to d/c.  Maintain hospital stay  while chest tube in place and draining fluid.  Radiology studies-  CT head 5/18-negative for acute pathology.  Chronic sinusitis  Ultrasound pelvis 5/18-normal  CT abdomen pelvis without contrast 5/19-small bilateral pleural effusion, atelectasis, right-sided renal cyst/concerns for pyelonephritis  MRI brain without contrast 5/21-no acute findings.  Chronic sinusitis  Echo 5/22-EF 50-55%, grade 1 DD hypokinesia of the distal septum/apex  EKG 5/24-diffuse severe encephalopathy but no seizures  MRI pelvis 5/24-negative for acute findings  Procedures:   Intubated 5/23, extubated 5/25   Subjective: Status post thrombolytics yesterday with at least 1 L drainage.  Symptomatically feeling little better this morning denies any shortness of breath at rest.  Review of Systems Otherwise negative except as per HPI, including: General: Denies fever, chills, night sweats or unintended weight loss. Resp: Denies cough, wheezing, shortness of breath. Cardiac: Denies chest pain, palpitations, orthopnea, paroxysmal nocturnal dyspnea. GI: Denies abdominal pain, nausea, vomiting, diarrhea or constipation GU: Denies dysuria, frequency, hesitancy or incontinence MS: Denies muscle aches, joint pain or swelling Neuro: Denies headache, neurologic deficits (focal weakness, numbness, tingling), abnormal gait Psych: Denies anxiety, depression, SI/HI/AVH Skin: Denies new rashes or lesions ID: Denies sick contacts, exotic exposures, travel  Examination: Constitutional: Not in acute distress Respiratory: Bibasilar crackles.  Right-sided breath sounds improved. Cardiovascular: Normal sinus rhythm, no rubs Abdomen: Nontender nondistended good bowel sounds Musculoskeletal: No edema noted Skin: No rashes seen Neurologic: CN 2-12 grossly intact.  And nonfocal Psychiatric: Normal judgment and insight. Alert and oriented x 3. Normal mood. Right-sided drain noted. Right chest wall, chest tube noted. Left  upper extremity midline in place  Objective: Vitals:   01/10/20 0028 01/10/20 0432 01/10/20 0433 01/10/20 0734  BP: 125/63 133/64  112/63  Pulse: 67 72    Resp: 13 17  15   Temp: 99.1 F (37.3 C) 99.5 F (37.5 C)  98.3 F (36.8 C)  TempSrc: Oral Oral  Oral  SpO2: 95% 94%  96%  Weight:   105.8 kg   Height:        Intake/Output Summary (Last 24 hours) at 01/10/2020 1114 Last data filed at 01/10/2020 0600 Gross per 24 hour  Intake 120 ml  Output 2180 ml  Net -2060 ml   Filed Weights   01/08/20 0400 01/09/20 0419 01/10/20 0433  Weight: 108.4 kg 107.8 kg 105.8 kg     Data Reviewed:   CBC: Recent Labs  Lab 01/05/20 0240 01/06/20 0621 01/08/20 0250 01/09/20 0256 01/10/20 0357  WBC 16.3* 13.4* 14.2* 14.6* 11.7*  NEUTROABS  --  9.2*  --   --   --   HGB 8.9* 8.0* 7.5* 7.7* 7.5*  HCT 29.6* 25.7* 24.0* 24.3* 24.5*  MCV 93.7 91.8 90.9 89.7 91.1  PLT 342 454* 460* PLATELET CLUMPS NOTED ON SMEAR, UNABLE TO ESTIMATE [id number]*   Basic Metabolic Panel: Recent Labs  Lab 01/04/20 0747 01/04/20 0747 01/05/20 0240 01/06/20 0621 01/08/20 0250 01/09/20 0256 01/10/20 0357  NA 143   < > 144 144 143 143 141  K 3.7   < > 3.9 4.3 4.4 4.5 4.5  CL 108   < > 106 108 108 106 104  CO2 27   < > 23 28 27 25 27   GLUCOSE 116*   < > 110* 131* 142* 143* 133*  BUN 46*   < >  34* 31* 24* 23 18  CREATININE 2.09*   < > 1.65* 1.73* 1.61* 1.52* 1.49*  CALCIUM 7.9*   < > 8.0* 8.0* 8.3* 8.3* 8.3*  MG 2.3  --   --  2.2 1.9 1.9 1.7  PHOS 2.5  --   --  2.5  --   --   --    < > = values in this interval not displayed.   GFR: Estimated Creatinine Clearance: 57.1 mL/min (A) (by C-G formula based on SCr of 1.49 mg/dL (H)). Liver Function Tests: Recent Labs  Lab 01/04/20 1532 01/05/20 0240 01/06/20 0621  AST  --  146* 44*  ALT  --  143* 89*  ALKPHOS  --  76 69  BILITOT  --  0.8 0.6  PROT 8.6* 7.0 6.8  ALBUMIN  --  2.2* 2.0*   No results for input(s): LIPASE, AMYLASE in the last 168 hours. No  results for input(s): AMMONIA in the last 168 hours. Coagulation Profile: No results for input(s): INR, PROTIME in the last 168 hours. Cardiac Enzymes: No results for input(s): CKTOTAL, CKMB, CKMBINDEX, TROPONINI in the last 168 hours. BNP (last 3 results) No results for input(s): PROBNP in the last 8760 hours. HbA1C: No results for input(s): HGBA1C in the last 72 hours. CBG: Recent Labs  Lab 01/09/20 1645 01/09/20 2026 01/10/20 0030 01/10/20 0430 01/10/20 0733  GLUCAP 147* 153* 103* 132* 119*   Lipid Profile: No results for input(s): CHOL, HDL, LDLCALC, TRIG, CHOLHDL, LDLDIRECT in the last 72 hours. Thyroid Function Tests: No results for input(s): TSH, T4TOTAL, FREET4, T3FREE, THYROIDAB in the last 72 hours. Anemia Panel: No results for input(s): VITAMINB12, FOLATE, FERRITIN, TIBC, IRON, RETICCTPCT in the last 72 hours. Sepsis Labs: Recent Labs  Lab 01/03/20 1348 01/04/20 1058 01/05/20 0240 01/06/20 0621  PROCALCITON  --  2.60 1.47 0.90  LATICACIDVEN 2.4*  --   --   --     Recent Results (from the past 240 hour(s))  Aerobic/Anaerobic Culture (surgical/deep wound)     Status: None   Collection Time: 01/01/20  3:14 PM   Specimen: Abscess  Result Value Ref Range Status   Specimen Description   Final    ABSCESS Performed at [facility] [address], [place], [place] [address]    Special Requests   Final    RIGHT RENAL CYST Performed at [facility] [address], [place], [place] [address]    Gram Stain   Final    FEW WBC PRESENT, PREDOMINANTLY PMN NO ORGANISMS SEEN    Culture   Final    FEW ESCHERICHIA COLI NO ANAEROBES ISOLATED Performed at [facility], [place] [address] [place], [place] [address]    Report Status 01/06/2020 FINAL  Final   Organism ID, Bacteria ESCHERICHIA COLI  Final      Susceptibility   Escherichia coli - MIC*    AMPICILLIN >=32 RESISTANT Resistant     CEFAZOLIN >=64 RESISTANT Resistant      CEFEPIME <=1 SENSITIVE Sensitive     CEFTAZIDIME <=1 SENSITIVE Sensitive     CEFTRIAXONE <=1 SENSITIVE Sensitive     CIPROFLOXACIN <=0.25 SENSITIVE Sensitive     GENTAMICIN <=1 SENSITIVE Sensitive     IMIPENEM <=0.25 SENSITIVE Sensitive     TRIMETH/SULFA <=20 SENSITIVE Sensitive     AMPICILLIN/SULBACTAM >=32 RESISTANT Resistant     PIP/TAZO 8 SENSITIVE Sensitive     * FEW ESCHERICHIA COLI  Culture, blood (Routine X 2)  w Reflex to ID Panel     Status: None   Collection Time: 01/04/20 10:58 AM   Specimen: BLOOD  Result Value Ref Range Status   Specimen Description   Final    BLOOD LEFT ARM Performed at [facility] [address] [place], [place] [address]    Special Requests   Final    BOTTLES DRAWN AEROBIC AND ANAEROBIC Blood Culture adequate volume Performed at [facility] [address], [place], [place] [address]    Culture   Final    NO GROWTH 5 DAYS Performed at [facility], [place] [address], [place], [place] [address]    Report Status 01/09/2020 FINAL  Final  Culture, blood (Routine X 2) w Reflex to ID Panel     Status: None   Collection Time: 01/04/20 10:58 AM   Specimen: BLOOD  Result Value Ref Range Status   Specimen Description   Final    BLOOD LEFT ARM Performed at [facility] [address] [place], [place] [address]    Special Requests   Final    BOTTLES DRAWN AEROBIC AND ANAEROBIC Blood Culture adequate volume Performed at [facility] [address], [place], [place] [address]    Culture   Final    NO GROWTH 5 DAYS Performed at [facility], [place] [address] [place], [place] [address]    Report Status 01/09/2020 FINAL  Final  Body fluid culture     Status: None   Collection Time: 01/05/20  3:38 PM   Specimen: Lung, Right; Pleural Fluid  Result Value Ref Range Status   Specimen Description   Final    PLEURAL Performed at [facility] [address], [place], [place] [address]    Special Requests   Final    NONE Performed at [facility], [place] [address] [place], [place] [address]    Gram Stain   Final    FEW WBC PRESENT, PREDOMINANTLY PMN NO ORGANISMS SEEN    Culture   Final    NO GROWTH 3 DAYS Performed at [facility] [address] [place], [place] [address]    Report Status 01/09/2020 FINAL  Final  Fungus Culture With Stain     Status: None (Preliminary result)   Collection Time: 01/05/20  3:38 PM   Specimen: Lung, Right; Pleural Fluid  Result Value Ref Range Status   Fungus Stain Final report  Final    Comment: (NOTE) Performed At: [facility] [place], [place] [id number] Rush Farmer MD [id number]    Fungus (Mycology) Culture PENDING  Incomplete   Fungal Source PLEURAL  Final    Comment: Performed at [facility], [place] [address], [place], [place] [address]  Fungus Culture Result     Status: None   Collection Time: 01/05/20  3:38 PM  Result Value Ref Range Status   Result 1 Comment  Final    Comment: (NOTE) KOH/Calcofluor preparation:  no fungus observed. Performed At: [facility] [place], [place] [id number] Rush Farmer MD [id number]          Radiology Studies: DG Chest Port 1 View  Result Date: 01/10/2020 CLINICAL DATA:  64 year old male with right side chest tube for treatment of a fusion. EXAM: PORTABLE CHEST 1 VIEW COMPARISON:  Portable chest 01/08/2020 and earlier. FINDINGS: Portable AP semi upright view at 1044 hours. Stable pigtail right chest  tube. No pneumothorax identified. Ventilation has further improved at the right lung base, with streaky residual opacity. Stable cardiac size and mediastinal contours. Stable mild left lung base atelectasis. Visualized tracheal air column is within normal limits. No acute osseous abnormality identified. IMPRESSION: 1. Stable right chest tube with  further improved right lung base ventilation and no pneumothorax identified. 2. No new cardiopulmonary abnormality. Electronically Signed   By: Genevie Ann M.D.   On: 01/10/2020 10:58   DG Chest Port 1 View  Result Date: 01/08/2020 CLINICAL DATA:  Weakness. EXAM: PORTABLE CHEST 1 VIEW COMPARISON:  Jan 06, 2020 FINDINGS: A right pigtail catheter remains on the right, repositioned. The distal tip of the catheter only partially overlies remaining fluid. There is a right-sided pleural effusion which is smaller in the interval with underlying opacity. No pneumothorax. No other acute interval changes. IMPRESSION: The right-sided pleural effusion is smaller in the interval. The right chest tube is been repositioned. The distal pigtail only partially overlies remaining fluid. No other changes. Electronically Signed   By: Dorise Bullion III M.D   On: 01/08/2020 12:42        Scheduled Meds: . alteplase (TPA) for intrapleural administration  10 mg Intrapleural Q12H   And  . pulmozyme (DORNASE) for intrapleural administration  5 mg Intrapleural Q12H  . aspirin EC  81 mg Oral QHS  . carvedilol  12.5 mg Oral BID WC  . Chlorhexidine Gluconate Cloth  6 each Topical Daily  . DULoxetine  20 mg Oral Daily  . famotidine  20 mg Oral QHS  . feeding supplement (ENSURE ENLIVE)  237 mL Oral Q24H  . feeding supplement (PRO-STAT SUGAR FREE 64)  30 mL Oral BID  . fluticasone  2 spray Each Nare Daily  . heparin injection (subcutaneous)  5,000 Units Subcutaneous Q8H  . insulin aspart  2-6 Units Subcutaneous Q4H  . mouth rinse  15 mL Mouth Rinse BID  . multivitamin with minerals  1 tablet Oral Daily  . nutrition supplement (JUVEN)  1 packet Oral BID BM  . polyethylene glycol  17 g Oral Daily  . potassium chloride SA  20 mEq Oral Daily  . predniSONE  5 mg Oral Q breakfast  . pregabalin  100 mg Oral BID  . sodium chloride flush  10-40 mL Intracatheter Q12H  . sodium chloride flush  5 mL Intravenous Q8H   Continuous  Infusions: . sodium chloride Stopped (12/30/19 1700)  . cefTRIAXone (ROCEPHIN)  IV 2 g (01/09/20 1128)     LOS: 14 days   Time spent= 35 mins    Keo Schirmer Arsenio Loader, MD [facility]  If 7PM-7AM, please contact night-coverage  01/10/2020, 11:14 AM

## 2020-01-10 NOTE — TOC Progression Note (Signed)
Transition of Care [facility]) - Progression Note    Patient Details  Name: Marco [name] MRN: [id number] Date of Birth: [birth year]  Transition of Care [facility]) CM/SW [place], [place] Phone Number: 01/10/2020, 1:52 PM  Clinical Narrative:    CSW spoke with patient regarding discharge planning. CSW discussed discharge options, including SNF. Patient is declining SNF and requests to go home with home health if possible. CSW explained that he may not be able to get home health, only outpatient therapy, due to his Medicaid. Patient reports that his Medicare should be effective today. He is not sure which kind of Medicare it is but states his wife will come to the hospital soon and he can ask her. CSW left contact info for them to contact CSW with the insurance info. Patient declined DME needs and reports he has a walker and wheelchair at home.    Expected Discharge Plan: [place] Barriers to Discharge: Continued Medical Work up  Expected Discharge Plan and Services Expected Discharge Plan: [place] In-house Referral: Clinical Social Work Discharge Planning Services: CM Consult Post Acute Care Choice: [place] arrangements for the past 2 months: Single Family Home Expected Discharge Date: (unknown)               DME Arranged: Walker rolling DME Agency: [employer] Date DME Agency Contacted: 12/28/19 Time DME Agency [id number] Representative spoke with at DME Agency: [place] ([place]) Interventions    Readmission Risk Interventions Readmission Risk Prevention Plan 12/28/2019  Transportation Screening Complete  PCP or Specialist Appt within 3-5 Days Complete  HRI or [place] Complete  Social Work Consult for [place] Planning/Counseling [place] Not Applicable  Medication Review Press photographer) Complete  Some recent data might be hidden

## 2020-01-10 NOTE — Progress Notes (Addendum)
[address]       [place] [address]             [phone]        Procedure(s) (LRB): VIDEO ASSISTED THORACOSCOPY (VATS)/DECORTICATION (Right) Subjective: Feels like breathing is more comfortable  Objective: Vital signs in last 24 hours: Temp:  [98.2 F (36.8 C)-99.5 F (37.5 C)] 99.5 F (37.5 C) (06/01 0432) Pulse Rate:  [67-72] 72 (06/01 0432) Cardiac Rhythm: Normal sinus rhythm (05/31 1900) Resp:  [13-17] 17 (06/01 0432) BP: (107-140)/(63-99) 133/64 (06/01 0432) SpO2:  [94 %-96 %] 94 % (06/01 0432) Weight:  [105.8 kg] 105.8 kg (06/01 0433)  Hemodynamic parameters for last 24 hours:    Intake/Output from previous day: 05/31 0701 - 06/01 0700 In: 130 [P.O.:120; I.V.:10] Out: 2180 [Urine:1070; Chest Tube:1110] Intake/Output this shift: No intake/output data recorded.  General appearance: alert, cooperative, fatigued and no distress Heart: regular rate and rhythm Lungs: slightly coarse Abdomen: soft, nontender Extremities: no eema or calf tenderness  Lab Results: Recent Labs    01/09/20 0256 01/10/20 0357  WBC 14.6* 11.7*  HGB 7.7* 7.5*  HCT 24.3* 24.5*  PLT PLATELET CLUMPS NOTED ON SMEAR, UNABLE TO ESTIMATE 431*   BMET:  Recent Labs    01/09/20 0256 01/10/20 0357  NA 143 141  K 4.5 4.5  CL 106 104  CO2 25 27  GLUCOSE 143* 133*  BUN 23 18  CREATININE 1.52* 1.49*  CALCIUM 8.3* 8.3*    PT/INR: No results for input(s): LABPROT, INR in the last 72 hours. ABG    Component Value Date/Time   PHART 7.453 (H) 01/02/2020 0355   HCO3 24.6 01/02/2020 0355   TCO2 25 05/19/2018 1430   ACIDBASEDEF 3.3 (H) 12/31/2019 0908   O2SAT 97.1 01/02/2020 0355   CBG (last 3)  Recent Labs    01/09/20 2026 01/10/20 0030 01/10/20 0430  GLUCAP 153* 103* 132*    Meds Scheduled Meds: . alteplase (TPA) for intrapleural administration  10 mg Intrapleural Q12H   And  . pulmozyme (DORNASE) for intrapleural administration  5 mg Intrapleural  Q12H  . aspirin EC  81 mg Oral QHS  . carvedilol  12.5 mg Oral BID WC  . Chlorhexidine Gluconate Cloth  6 each Topical Daily  . DULoxetine  20 mg Oral Daily  . famotidine  20 mg Oral QHS  . feeding supplement (ENSURE ENLIVE)  237 mL Oral Q24H  . feeding supplement (PRO-STAT SUGAR FREE 64)  30 mL Oral BID  . fluticasone  2 spray Each Nare Daily  . heparin injection (subcutaneous)  5,000 Units Subcutaneous Q8H  . insulin aspart  2-6 Units Subcutaneous Q4H  . mouth rinse  15 mL Mouth Rinse BID  . multivitamin with minerals  1 tablet Oral Daily  . nutrition supplement (JUVEN)  1 packet Oral BID BM  . polyethylene glycol  17 g Oral Daily  . potassium chloride SA  20 mEq Oral Daily  . predniSONE  5 mg Oral Q breakfast  . pregabalin  100 mg Oral BID  . sodium chloride flush  10-40 mL Intracatheter Q12H  . sodium chloride flush  5 mL Intravenous Q8H   Continuous Infusions: . sodium chloride Stopped (12/30/19 1700)  . cefTRIAXone (ROCEPHIN)  IV 2 g (01/09/20 1128)   PRN Meds:.sodium chloride, acetaminophen, albuterol, fentaNYL (SUBLIMAZE) injection, hydrALAZINE, oxyCODONE-acetaminophen, polyethylene glycol, senna-docusate, sodium chloride flush  Xrays DG Chest Port 1 View  Result Date: 01/08/2020 CLINICAL DATA:  Weakness. EXAM: PORTABLE CHEST 1 VIEW COMPARISON:  Jan 06, 2020 FINDINGS: A right pigtail catheter remains on the right, repositioned. The distal tip of the catheter only partially overlies remaining fluid. There is a right-sided pleural effusion which is smaller in the interval with underlying opacity. No pneumothorax. No other acute interval changes. IMPRESSION: The right-sided pleural effusion is smaller in the interval. The right chest tube is been repositioned. The distal pigtail only partially overlies remaining fluid. No other changes. Electronically Signed   By: Dorise Bullion III M.D   On: 01/08/2020 12:42    Assessment/Plan: S/P Procedure(s) (LRB): VIDEO ASSISTED  THORACOSCOPY (VATS)/DECORTICATION (Right)  1 day #1 after thrombolytics to chest drain,  > 1 liter drainage (serous), cont to follow, will repeat CXR in am 2 medical management as per primary    LOS: 14 days    Gaspar Bidding Pager [phone]  01/10/2020  Patient drained over a liter with 1 dose of thrombolytics. Awaiting repeat chest x-ray if there is a good result consider removing the chest tube the output is less than 150.  Marco [name]

## 2020-01-10 NOTE — Progress Notes (Signed)
Referring Physician(s): Dr. Jeffie Pollock  Supervising Physician: Daryll Brod  Patient Status:  [facility] - In-pt  Chief Complaint: Abdominal pain, fevers, hemorrhagic right renal cyst.   Subjective: Patient awake in bed, O2 sats in the high 80's/low 90's on room air. Patient denies pain or discomfort.   Allergies: Nsaids  Medications: Prior to Admission medications   Medication Sig Start Date End Date Taking? Authorizing Provider  albuterol (PROVENTIL HFA;VENTOLIN HFA) 108 (90 Base) MCG/ACT inhaler Inhale 2 puffs into the lungs every 6 (six) hours as needed for wheezing or shortness of breath. 04/29/18  Yes [name], MD  allopurinol (ZYLOPRIM) 100 MG tablet Take 200 mg by mouth every evening.    Yes [provider]  atorvastatin (LIPITOR) 40 MG tablet Take 1 tablet (40 mg total) by mouth daily. 09/27/19  Yes Sueanne Margarita, MD  carvedilol (COREG) 12.5 MG tablet Take 1 tablet (12.5 mg total) by mouth 2 (two) times daily with a meal. 09/12/19  Yes Larey Dresser, MD  diclofenac Sodium (VOLTAREN) 1 % GEL Apply 4 g topically 4 (four) times daily. 10/23/19  Yes Darr, Marguerita Beards, [name]  DULoxetine (CYMBALTA) 20 MG capsule TAKE 1 CAPSULE BY MOUTH ONCE DAILY Patient taking differently: Take 20 mg by mouth daily.  12/21/19  Yes [name], MD  EQ ASPIRIN ADULT LOW DOSE 81 MG EC tablet TAKE 1 TABLET BY MOUTH ONCE DAILY. SWALLOW WHOLE Patient taking differently: Take 81 mg by mouth at bedtime.  08/01/19  Yes [name], MD  famotidine (PEPCID) 20 MG tablet TAKE 1 TABLET BY MOUTH TWO TIMES DAILY Patient taking differently: Take 20 mg by mouth 2 (two) times daily.  12/21/19  Yes [name], MD  fluticasone (FLONASE) 50 MCG/ACT nasal spray Place 2 sprays into both nostrils daily. 09/16/19  Yes [name], MD  furosemide (LASIX) 40 MG tablet Take 1 tablet (40 mg total) by mouth daily. Can take extra as needed 12/16/19 12/15/20 Yes Bensimhon, Shaune Pascal, MD  Insulin Lispro  Prot & Lispro (HUMALOG MIX 75/25 KWIKPEN) (75-25) 100 UNIT/ML Kwikpen Inject 100 Units into the skin daily with breakfast. 12/20/19  Yes Renato Shin, MD  oxyCODONE (OXY IR/ROXICODONE) 5 MG immediate release tablet Take 5 mg by mouth every 6 (six) hours. 11/09/19  Yes [provider]  potassium chloride SA (KLOR-CON) 20 MEQ tablet Take 20 mEq by mouth daily. 12/05/19  Yes [provider]  predniSONE (DELTASONE) 5 MG tablet Take 5 mg by mouth daily. 11/23/19  Yes [provider]  pregabalin (LYRICA) 100 MG capsule Take 100 mg by mouth 3 (three) times daily.    Yes [provider]  sacubitril-valsartan (ENTRESTO) 49-51 MG Take 1 tablet by mouth 2 (two) times daily. Patient taking differently: Take 0.5 tablets by mouth 2 (two) times daily.  12/16/19  Yes Bensimhon, Shaune Pascal, MD  Secukinumab (COSENTYX) 150 MG/ML SOSY Inject 150 mg into the skin every 28 (twenty-eight) days.  06/02/16  Yes [provider]  Tafamidis (VYNDAMAX) 61 MG CAPS Take 61 mg by mouth daily. 09/08/19  Yes Bensimhon, Shaune Pascal, MD  zolpidem (AMBIEN CR) 6.25 MG CR tablet TAKE 1 TABLET BY MOUTH EVERY DAY AT BEDTIME AS NEEDED FOR SLEEP Patient taking differently: Take 6.25 mg by mouth at bedtime.  12/02/19  Yes [name], MD  Accu-Chek FastClix Lancets MISC Use one strip to monitor glucose levels BID; E11.42 12/06/18   Renato Shin, MD  glucose blood (ACCU-CHEK  GUIDE) test strip Use one strip to monitor glucose levels BID; E11.42 12/06/18   Renato Shin, MD  Insulin Pen Needle (PEN NEEDLES) 31G X 6 MM MISC 1 each by Does not apply route daily. E11.9 10/19/19   Renato Shin, MD     Vital Signs: BP 123/65 (BP Location: Left Wrist)   Pulse 69   Temp 98.4 F (36.9 C) (Oral)   Resp 16   Ht 5\' 6"  (1.676 m)   Wt 233 lb 4 oz (105.8 kg)   SpO2 92%   BMI 37.65 kg/m   Physical Exam Constitutional:      General: He is not in acute distress. Pulmonary:     Effort: Pulmonary effort is  normal.     Comments: Right pig-tail chest tube in place. Insertion site is clean and dry without swelling or erythema. No dressing in place. Serous fluid in the collection container and tubing.  Abdominal:     Comments: Right renal drain in place. Dressing is clean and dry. Approximately 10 cc serosanguineous fluid in the bulb.   Skin:    General: Skin is warm and dry.  Neurological:     Mental Status: He is alert.     Imaging: IR Guided Drain W Catheter Placement  Result Date: 01/07/2020 INDICATION: 64 year old with complex right pleural effusion. Request for chest tube placement. EXAM: IMAGE GUIDED RIGHT CHEST TUBE PLACEMENT MEDICATIONS: Moderate sedation ANESTHESIA/SEDATION: Fentanyl 50 mcg IV; Versed 1.0 mg IV Moderate Sedation Time:  18 minutes The patient was continuously monitored during the procedure by the interventional radiology nurse under my direct supervision. COMPLICATIONS: None immediate. PROCEDURE: Informed written consent was obtained from the patient's wife after a thorough discussion of the procedural risks, benefits and alternatives. All questions were addressed. Maximal Sterile Barrier Technique was utilized including caps, mask, sterile gowns, sterile gloves, sterile drape, hand hygiene and skin antiseptic. A timeout was performed prior to the initiation of the procedure. Patient was rolled on his left side. Right chest was evaluated with ultrasound. Adequate pleural pocket was identified with ultrasound in the right mid axillary region. The right side of the chest was prepped and draped in sterile fashion. Skin was anesthetized with 1% lidocaine. Small incision was made. Using ultrasound guidance, a Yueh catheter was directed into the pleural space and yellow pleural fluid was aspirated. Superstiff Amplatz wire was advanced into the pleural space and the tract was dilated to accommodate a 14 [place] multipurpose drain. Catheter was sutured to skin and attached to a PleurEvac  collecting system. Fluoroscopic and ultrasound images were taken and saved for documentation. FINDINGS: Right pleural fluid has some loculations. 30 French drain was placed within the right pleural collection. Yellow fluid was draining from the chest tube at the end of the procedure. IMPRESSION: Successful placement of right chest tube using image guidance. Electronically Signed   By: Markus Daft M.D.   On: 01/07/2020 11:35   DG Chest Port 1 View  Result Date: 01/10/2020 CLINICAL DATA:  64 year old male with right side chest tube for treatment of a fusion. EXAM: PORTABLE CHEST 1 VIEW COMPARISON:  Portable chest 01/08/2020 and earlier. FINDINGS: Portable AP semi upright view at 1044 hours. Stable pigtail right chest tube. No pneumothorax identified. Ventilation has further improved at the right lung base, with streaky residual opacity. Stable cardiac size and mediastinal contours. Stable mild left lung base atelectasis. Visualized tracheal air column is within normal limits. No acute osseous abnormality identified. IMPRESSION: 1. Stable right chest tube with  further improved right lung base ventilation and no pneumothorax identified. 2. No new cardiopulmonary abnormality. Electronically Signed   By: Genevie Ann M.D.   On: 01/10/2020 10:58   DG Chest Port 1 View  Result Date: 01/08/2020 CLINICAL DATA:  Weakness. EXAM: PORTABLE CHEST 1 VIEW COMPARISON:  Jan 06, 2020 FINDINGS: A right pigtail catheter remains on the right, repositioned. The distal tip of the catheter only partially overlies remaining fluid. There is a right-sided pleural effusion which is smaller in the interval with underlying opacity. No pneumothorax. No other acute interval changes. IMPRESSION: The right-sided pleural effusion is smaller in the interval. The right chest tube is been repositioned. The distal pigtail only partially overlies remaining fluid. No other changes. Electronically Signed   By: Dorise Bullion III M.D   On: 01/08/2020 12:42      Labs:  CBC: Recent Labs    01/06/20 [id number] 01/08/20 0250 01/09/20 0256 01/10/20 0357  WBC 13.4* 14.2* 14.6* 11.7*  HGB 8.0* 7.5* 7.7* 7.5*  HCT 25.7* 24.0* 24.3* 24.5*  PLT 454* 460* PLATELET CLUMPS NOTED ON SMEAR, UNABLE TO ESTIMATE 431*    COAGS: Recent Labs    12/08/19 2014 12/30/19 1106  INR 1.3* 1.2  APTT 33  --     BMP: Recent Labs    01/06/20 0621 01/08/20 0250 01/09/20 0256 01/10/20 0357  NA 144 143 143 141  K 4.3 4.4 4.5 4.5  CL 108 108 106 104  CO2 28 27 25 27   GLUCOSE 131* 142* 143* 133*  BUN 31* 24* 23 18  CALCIUM 8.0* 8.3* 8.3* 8.3*  CREATININE 1.73* 1.61* 1.52* 1.49*  GFRNONAA 41* 45* 48* 49*  GFRAA 47* 52* 55* 57*    LIVER FUNCTION TESTS: Recent Labs    01/02/20 0521 01/02/20 0521 01/03/20 0444 01/04/20 1532 01/05/20 0240 01/06/20 0621  BILITOT 1.2  --  1.0  --  0.8 0.6  AST 37  --  67*  --  146* 44*  ALT 30  --  67*  --  143* 89*  ALKPHOS 58  --  56  --  76 69  PROT 7.3   < > 6.6 8.6* 7.0 6.8  ALBUMIN 2.6*  --  2.2*  --  2.2* 2.0*   < > = values in this interval not displayed.    Assessment and Plan: Patient admitted with sepsis, possibly secondary to right renal cyst; required intubation. IR placed right renal drain 01/01/20. Patient now extubated. Pleural effusion noted on imaging. Dr. Pascal Lux performed a diagnostic thoracentesis on patient, yielding 25 cc of fluid - lab results consistent with infection/empyema.  Patient transferred to [facility] for further interventions/work up. Patient had a right chest tube placed by  Dr. Anselm Pancoast in IR on 01/07/2020. CT surgery also consulting - thrombolytics inserted into the chest tube 01/09/2020.   1100 cc chest tube output documented in the past 24 hours. 0 cc documented for the renal drain. Approximately 10 cc in the bulb.   Continue current drain and chest tube orders. IR will continue to follow.   Electronically Signed: Theresa Duty, NP 01/10/2020, 3:31 PM   I spent a total of 15  Minutes at the the patient's bedside AND on the patient's hospital floor or unit, greater than 50% of which was counseling/coordinating care for right renal drain and right chest tube.

## 2020-01-10 NOTE — Progress Notes (Signed)
Physical Therapy Treatment Patient Details Name: Marco [name] MRN: [id number] DOB: [birth year] Today's Date: 01/10/2020    History of Present Illness Patient is a 64 y/o male who presents with E-Coli right pyelonephritis.  Initially admitted with concern for sepsis with fever, leukocytosis and elevated lactic acid. Work up concerning for possible infected right renal cyst with associated right renal pyelonephritis.  He had progressive agitated delirium, fevers despite antibiotics.  IR drained renal cyst 5/23 with bloody fluid returned.  Ultimately, required intubation on 5/23 for agitated delirium / impending respiratory failure.  Extubated 5/25. s/p CT placement 5/29 with plan for thrombolytics.    PT Comments    Patient progressing well towards PT goals. Reports feeling tired today but agreeable to therapy. Tolerated gait training with RW, Min guard assist and chair follow for safety (wife assisted). Sp02 dropped to 84% on 2L/min 02 [place] (dizziness reported), increased 02 to 3L for activity and able to maintain Sp02 >90%. Reports worsened pain at chest tube site post mobility. Will continue to follow and progress as tolerated.   Follow Up Recommendations  Home health PT;Supervision for mobility/OOB     Equipment Recommendations  Rolling walker with 5" wheels    Recommendations for Other Services       Precautions / Restrictions Precautions Precautions: Fall Precaution Comments: Right drain, chest tube to suction, watch 02 Restrictions Weight Bearing Restrictions: No    Mobility  Bed Mobility Overal bed mobility: Needs Assistance Bed Mobility: Rolling;Sidelying to Sit Rolling: Min guard Sidelying to sit: HOB elevated;Mod assist       General bed mobility comments: Able to roll onto side but needed assist with trunk and scooting bottom to EOB.  Transfers Overall transfer level: Needs assistance Equipment used: Rolling walker (2 wheeled) Transfers: Sit to/from [employer] Sit to Stand: Min assist Stand pivot transfers: Min guard       General transfer comment: Min assist to power to standing from EOB progressing to min guard from chair. Stood from [employer], from chair x1. SPT chair to bed, managed lines.  Ambulation/Gait Ambulation/Gait assistance: Min guard;+2 safety/equipment Gait Distance (Feet): 40 Feet Assistive device: Rolling walker (2 wheeled) Gait Pattern/deviations: Step-through pattern;Decreased stride length;Trunk flexed Gait velocity: decreased Gait velocity interpretation: <1.31 ft/sec, indicative of household ambulator General Gait Details: Slow, mildly unsteady gait with 2/4 DOE. + dizziness with Sp02 at 84% on 2L02 , increased to 3L02 and able to maintain Sp02 >90%.   Stairs             Wheelchair Mobility    Modified Rankin (Stroke Patients Only)       Balance Overall balance assessment: Needs assistance Sitting-balance support: Feet supported;No upper extremity supported Sitting balance-Leahy Scale: Fair Sitting balance - Comments: supervision for safety.   Standing balance support: During functional activity Standing balance-Leahy Scale: Poor Standing balance comment: requires B UE support of walker and Min guard.                            Cognition Arousal/Alertness: Awake/alert Behavior During Therapy: Flat affect Overall Cognitive Status: Within Functional Limits for tasks assessed                                 General Comments: Reports being sleepy. Reports he did not sleep at all last night or this AM, however pt asleep this AM upon first PT  attempt.      Exercises      General Comments General comments (skin integrity, edema, etc.): Wife present during session. Sp02 dropped to 84% on 2L 02 [place], increased to 3L for activity and able to maintain Sp02 >90%.      Pertinent Vitals/Pain Pain Assessment: Faces Faces Pain Scale: Hurts even more Pain Location:  right side at chest tube insertion Pain Descriptors / Indicators: Sore;Grimacing;Guarding Pain Intervention(s): Monitored during session;Repositioned;Limited activity within patient's tolerance    Home Living                      Prior Function            PT Goals (current goals can now be found in the care plan section) Progress towards PT goals: Progressing toward goals    Frequency    Min 3X/week      PT Plan Current plan remains appropriate    Co-evaluation              AM-PAC PT "6 Clicks" Mobility   Outcome Measure  Help needed turning from your back to your side while in a flat bed without using bedrails?: A Little Help needed moving from lying on your back to sitting on the side of a flat bed without using bedrails?: A Lot Help needed moving to and from a bed to a chair (including a wheelchair)?: A Little Help needed standing up from a chair using your arms (e.g., wheelchair or bedside chair)?: A Little Help needed to walk in hospital room?: A Little Help needed climbing 3-5 steps with a railing? : A Lot 6 Click Score: 16    End of Session Equipment Utilized During Treatment: Gait belt;Oxygen Activity Tolerance: Patient tolerated treatment well Patient left: in bed;with call bell/phone within reach;with bed alarm set;with family/visitor present(left in bed for thrombolytics) Nurse Communication: Mobility status PT Visit Diagnosis: Difficulty in walking, not elsewhere classified (R26.2);Pain Pain - part of body: (chest tube site)     Time: [id number] PT Time Calculation (min) (ACUTE ONLY): 33 min  Charges:  $Gait Training: 23-37 mins                     Marisa Severin, PT, DPT Acute Rehabilitation Services Pager [phone] Office [place] 01/10/2020, 3:44 PM

## 2020-01-11 ENCOUNTER — Inpatient Hospital Stay (HOSPITAL_COMMUNITY): Payer: Medicaid Other

## 2020-01-11 ENCOUNTER — Encounter (HOSPITAL_COMMUNITY): Payer: Self-pay

## 2020-01-11 DIAGNOSIS — J9383 Other pneumothorax: Secondary | ICD-10-CM

## 2020-01-11 LAB — GLUCOSE, CAPILLARY
Glucose-Capillary: 113 mg/dL — ABNORMAL HIGH (ref 70–99)
Glucose-Capillary: 123 mg/dL — ABNORMAL HIGH (ref 70–99)
Glucose-Capillary: 133 mg/dL — ABNORMAL HIGH (ref 70–99)
Glucose-Capillary: 164 mg/dL — ABNORMAL HIGH (ref 70–99)
Glucose-Capillary: 190 mg/dL — ABNORMAL HIGH (ref 70–99)
Glucose-Capillary: 90 mg/dL (ref 70–99)

## 2020-01-11 LAB — BASIC METABOLIC PANEL
Anion gap: 7 (ref 5–15)
BUN: 17 mg/dL (ref 8–23)
CO2: 28 mmol/L (ref 22–32)
Calcium: 8.3 mg/dL — ABNORMAL LOW (ref 8.9–10.3)
Chloride: 104 mmol/L (ref 98–111)
Creatinine, Ser: 1.36 mg/dL — ABNORMAL HIGH (ref 0.61–1.24)
GFR calc Af Amer: >60 mL/min (ref >60)
GFR calc non Af Amer: 55 mL/min — ABNORMAL LOW (ref >60)
Glucose, Bld: 98 mg/dL (ref 70–99)
Potassium: 4.5 mmol/L (ref 3.5–5.1)
Sodium: 139 mmol/L (ref 135–145)

## 2020-01-11 LAB — CBC
HCT: 24 % — ABNORMAL LOW (ref 39.0–52.0)
Hemoglobin: 7.4 g/dL — ABNORMAL LOW (ref 13.0–17.0)
MCH: 27.9 pg (ref 26.0–34.0)
MCHC: 30.8 g/dL (ref 30.0–36.0)
MCV: 90.6 fL (ref 80.0–100.0)
Platelets: 471 10*3/uL — ABNORMAL HIGH (ref 150–400)
RBC: 2.65 MIL/uL — ABNORMAL LOW (ref 4.22–5.81)
RDW: 17.2 % — ABNORMAL HIGH (ref 11.5–15.5)
WBC: 11 10*3/uL — ABNORMAL HIGH (ref 4.0–10.5)
nRBC: 0.3 % — ABNORMAL HIGH (ref 0.0–0.2)

## 2020-01-11 LAB — MAGNESIUM: Magnesium: 1.8 mg/dL (ref 1.7–2.4)

## 2020-01-11 MED ORDER — POLYETHYLENE GLYCOL 3350 17 G PO PACK
17.0000 g | PACK | Freq: Every day | ORAL | Status: DC
  Administered 2020-01-12 – 2020-01-13 (×2): 17 g via ORAL
  Filled 2020-01-11 (×3): qty 1

## 2020-01-11 MED ORDER — INSULIN ASPART 100 UNIT/ML ~~LOC~~ SOLN: 0.0000 [IU] | Freq: Every day | SUBCUTANEOUS | Status: DC

## 2020-01-11 MED ORDER — SENNOSIDES-DOCUSATE SODIUM 8.6-50 MG PO TABS
2.0000 | ORAL_TABLET | Freq: Two times a day (BID) | ORAL | Status: DC
  Administered 2020-01-11 – 2020-01-14 (×6): 2 via ORAL
  Filled 2020-01-11 (×7): qty 2

## 2020-01-11 MED ORDER — FENTANYL CITRATE (PF) 100 MCG/2ML IJ SOLN
50.0000 ug | INTRAMUSCULAR | Status: DC | PRN
  Administered 2020-01-12: 50 ug via INTRAVENOUS
  Filled 2020-01-11 (×2): qty 2

## 2020-01-11 MED ORDER — INSULIN ASPART 100 UNIT/ML ~~LOC~~ SOLN
0.0000 [IU] | Freq: Three times a day (TID) | SUBCUTANEOUS | Status: DC
  Administered 2020-01-11 – 2020-01-12 (×3): 3 [IU] via SUBCUTANEOUS
  Administered 2020-01-12: 2 [IU] via SUBCUTANEOUS
  Administered 2020-01-13: 3 [IU] via SUBCUTANEOUS

## 2020-01-11 NOTE — Progress Notes (Signed)
Referring Physician(s): Dr. Jeffie Pollock  Supervising Physician: Arne Cleveland  Patient Status:  [facility] - In-pt  Chief Complaint: Abdominal pain, fevers, hemorrhagic right renal cyst.   Subjective: Reports noticeable improvement in his breathing after thrombolytics yesterday.  1800 mL marked on Pleur-evac container.   Allergies: Nsaids  Medications: Prior to Admission medications   Medication Sig Start Date End Date Taking? Authorizing Provider  albuterol (PROVENTIL HFA;VENTOLIN HFA) 108 (90 Base) MCG/ACT inhaler Inhale 2 puffs into the lungs every 6 (six) hours as needed for wheezing or shortness of breath. 04/29/18  Yes [name], MD  allopurinol (ZYLOPRIM) 100 MG tablet Take 200 mg by mouth every evening.    Yes [provider]  atorvastatin (LIPITOR) 40 MG tablet Take 1 tablet (40 mg total) by mouth daily. 09/27/19  Yes Sueanne Margarita, MD  carvedilol (COREG) 12.5 MG tablet Take 1 tablet (12.5 mg total) by mouth 2 (two) times daily with a meal. 09/12/19  Yes Larey Dresser, MD  diclofenac Sodium (VOLTAREN) 1 % GEL Apply 4 g topically 4 (four) times daily. 10/23/19  Yes Darr, Marguerita Beards, [name]  DULoxetine (CYMBALTA) 20 MG capsule TAKE 1 CAPSULE BY MOUTH ONCE DAILY Patient taking differently: Take 20 mg by mouth daily.  12/21/19  Yes [name], MD  EQ ASPIRIN ADULT LOW DOSE 81 MG EC tablet TAKE 1 TABLET BY MOUTH ONCE DAILY. SWALLOW WHOLE Patient taking differently: Take 81 mg by mouth at bedtime.  08/01/19  Yes [name], MD  famotidine (PEPCID) 20 MG tablet TAKE 1 TABLET BY MOUTH TWO TIMES DAILY Patient taking differently: Take 20 mg by mouth 2 (two) times daily.  12/21/19  Yes [name], MD  fluticasone (FLONASE) 50 MCG/ACT nasal spray Place 2 sprays into both nostrils daily. 09/16/19  Yes [name], MD  furosemide (LASIX) 40 MG tablet Take 1 tablet (40 mg total) by mouth daily. Can take extra as needed 12/16/19 12/15/20 Yes Bensimhon, Shaune Pascal,  MD  Insulin Lispro Prot & Lispro (HUMALOG MIX 75/25 KWIKPEN) (75-25) 100 UNIT/ML Kwikpen Inject 100 Units into the skin daily with breakfast. 12/20/19  Yes Renato Shin, MD  oxyCODONE (OXY IR/ROXICODONE) 5 MG immediate release tablet Take 5 mg by mouth every 6 (six) hours. 11/09/19  Yes [provider]  potassium chloride SA (KLOR-CON) 20 MEQ tablet Take 20 mEq by mouth daily. 12/05/19  Yes [provider]  predniSONE (DELTASONE) 5 MG tablet Take 5 mg by mouth daily. 11/23/19  Yes [provider]  pregabalin (LYRICA) 100 MG capsule Take 100 mg by mouth 3 (three) times daily.    Yes [provider]  sacubitril-valsartan (ENTRESTO) 49-51 MG Take 1 tablet by mouth 2 (two) times daily. Patient taking differently: Take 0.5 tablets by mouth 2 (two) times daily.  12/16/19  Yes Bensimhon, Shaune Pascal, MD  Secukinumab (COSENTYX) 150 MG/ML SOSY Inject 150 mg into the skin every 28 (twenty-eight) days.  06/02/16  Yes [provider]  Tafamidis (VYNDAMAX) 61 MG CAPS Take 61 mg by mouth daily. 09/08/19  Yes Bensimhon, Shaune Pascal, MD  zolpidem (AMBIEN CR) 6.25 MG CR tablet TAKE 1 TABLET BY MOUTH EVERY DAY AT BEDTIME AS NEEDED FOR SLEEP Patient taking differently: Take 6.25 mg by mouth at bedtime.  12/02/19  Yes [name], MD  Accu-Chek FastClix Lancets MISC Use one strip to monitor glucose levels BID; E11.42 12/06/18   Renato Shin, MD  glucose blood (ACCU-CHEK GUIDE) test strip  Use one strip to monitor glucose levels BID; E11.42 12/06/18   Renato Shin, MD  Insulin Pen Needle (PEN NEEDLES) 31G X 6 MM MISC 1 each by Does not apply route daily. E11.9 10/19/19   Renato Shin, MD     Vital Signs: BP 115/66 (BP Location: Left Arm)   Pulse 72   Temp 98.2 F (36.8 C) (Oral)   Resp 14   Ht 5\' 6"  (1.676 m)   Wt 228 lb 2.8 oz (103.5 kg)   SpO2 96%   BMI 36.83 kg/m   Physical Exam  NAD, alert Chest/Lungs: Right chest tube in place. Insertion site is clean and dry  without swelling or erythema. No dressing in place. Serous fluid in the collection container and tubing- 1800 mL marked. Abdomen:  Right renal drain in place. Dressing is clean and dry. 10 mL recorded overnight.   Imaging: DG CHEST PORT 1 VIEW  Result Date: 01/11/2020 CLINICAL DATA:  Chest tube EXAM: PORTABLE CHEST 1 VIEW COMPARISON:  Chest radiograph 01/10/2020 FINDINGS: Stable positioning of a right pigtail chest tube. No pneumothorax. Persistent right greater than left bibasilar atelectasis. No large pleural effusion. Stable cardiomediastinal contours with enlarged heart size. IMPRESSION: Stable chest tube position. No pneumothorax. Electronically Signed   By: Audie Pinto M.D.   On: 01/11/2020 08:34   DG Chest Port 1 View  Result Date: 01/10/2020 CLINICAL DATA:  64 year old male with right side chest tube for treatment of a fusion. EXAM: PORTABLE CHEST 1 VIEW COMPARISON:  Portable chest 01/08/2020 and earlier. FINDINGS: Portable AP semi upright view at 1044 hours. Stable pigtail right chest tube. No pneumothorax identified. Ventilation has further improved at the right lung base, with streaky residual opacity. Stable cardiac size and mediastinal contours. Stable mild left lung base atelectasis. Visualized tracheal air column is within normal limits. No acute osseous abnormality identified. IMPRESSION: 1. Stable right chest tube with further improved right lung base ventilation and no pneumothorax identified. 2. No new cardiopulmonary abnormality. Electronically Signed   By: Genevie Ann M.D.   On: 01/10/2020 10:58   DG Chest Port 1 View  Result Date: 01/08/2020 CLINICAL DATA:  Weakness. EXAM: PORTABLE CHEST 1 VIEW COMPARISON:  Jan 06, 2020 FINDINGS: A right pigtail catheter remains on the right, repositioned. The distal tip of the catheter only partially overlies remaining fluid. There is a right-sided pleural effusion which is smaller in the interval with underlying opacity. No pneumothorax. No  other acute interval changes. IMPRESSION: The right-sided pleural effusion is smaller in the interval. The right chest tube is been repositioned. The distal pigtail only partially overlies remaining fluid. No other changes. Electronically Signed   By: Dorise Bullion III M.D   On: 01/08/2020 12:42    Labs:  CBC: Recent Labs    01/08/20 0250 01/09/20 0256 01/10/20 0357 01/11/20 0540  WBC 14.2* 14.6* 11.7* 11.0*  HGB 7.5* 7.7* 7.5* 7.4*  HCT 24.0* 24.3* 24.5* 24.0*  PLT 460* PLATELET CLUMPS NOTED ON SMEAR, UNABLE TO ESTIMATE 431* 471*    COAGS: Recent Labs    12/08/19 2014 12/30/19 1106  INR 1.3* 1.2  APTT 33  --     BMP: Recent Labs    01/08/20 0250 01/09/20 0256 01/10/20 0357 01/11/20 0540  NA 143 143 141 139  K 4.4 4.5 4.5 4.5  CL 108 106 104 104  CO2 27 25 27 28   GLUCOSE 142* 143* 133* 98  BUN 24* 23 18 17   CALCIUM 8.3* 8.3* 8.3* 8.3*  CREATININE 1.61* 1.52* 1.49* 1.36*  GFRNONAA 45* 48* 49* 55*  GFRAA 52* 55* 57* >60    LIVER FUNCTION TESTS: Recent Labs    01/02/20 0521 01/02/20 0521 01/03/20 0444 01/04/20 1532 01/05/20 0240 01/06/20 0621  BILITOT 1.2  --  1.0  --  0.8 0.6  AST 37  --  67*  --  146* 44*  ALT 30  --  67*  --  143* 89*  ALKPHOS 58  --  56  --  76 69  PROT 7.3   < > 6.6 8.6* 7.0 6.8  ALBUMIN 2.6*  --  2.2*  --  2.2* 2.0*   < > = values in this interval not displayed.    Assessment and Plan: Right renal cyst s/p aspiration and drainage by Dr. Anselm Pancoast 01/01/20 Renal drain continues with small output of 10-15 daily.  Culture positive for E coli.  Continue routine drain care at this time.   Right chest tube 01/07/20 by Dr. Anselm Pancoast. Improvement noted after thrombolytics yesterday.  230 mL output documented, PleurEvac noted at 1800 mL mark.  CXR improving. Further management per TCTS.  Continue current drain and chest tube orders. IR will continue to follow.   Electronically Signed: Docia Barrier, PA 01/11/2020, 4:30  PM   I spent a total of 15 Minutes at the the patient's bedside AND on the patient's hospital floor or unit, greater than 50% of which was counseling/coordinating care for right renal abscess, loculated pleural effusion.

## 2020-01-11 NOTE — Progress Notes (Signed)
Physical Therapy Treatment Patient Details Name: Marco [name] MRN: [id number] DOB: 1956-04-03 Today's Date: 01/11/2020    History of Present Illness Patient is a 64 y/o male who presents with E-Coli right pyelonephritis.  Initially admitted with concern for sepsis with fever, leukocytosis and elevated lactic acid. Work up concerning for possible infected right renal cyst with associated right renal pyelonephritis.  He had progressive agitated delirium, fevers despite antibiotics.  IR drained renal cyst 5/23 with bloody fluid returned.  Ultimately, required intubation on 5/23 for agitated delirium / impending respiratory failure.  Extubated 5/25. s/p CT placement 5/29 with plan for thrombolytics.    PT Comments    Pt demonstrated improvement with decreasing assist level with bed mobility and transfers. Pt spend prolonged time sitting EOB independent, to work on core strength and endurance, without instability, reporting increasing fatigue with increased duration of sitting. Pt able to amb greater distances today min guard with 2 standing and 1 seated rest break. Spo2 >88% with amb 1L O2 by [place] with amb, O2 titrated to 2L spo2 >90% at end of session. Pt educated on pursed lip breathing, which assisted in increased spo2. Will continue to follow acutely until d/c to next venue of care.   Follow Up Recommendations  Home health PT;Supervision for mobility/OOB     Equipment Recommendations  Rolling walker with 5" wheels    Recommendations for Other Services       Precautions / Restrictions Precautions Precautions: Fall Precaution Comments: Right drain, chest tube to suction, watch 02 Restrictions Weight Bearing Restrictions: No    Mobility  Bed Mobility Overal bed mobility: Needs Assistance   Rolling: Min guard   Supine to sit: HOB elevated;Min guard     General bed mobility comments: Pt required min guard for sitting EOB, with assistance for lines. Prolonged time sitting EOB without  instability to work on core strength and endurance.  Transfers Overall transfer level: Needs assistance Equipment used: Rolling walker (2 wheeled) Transfers: Sit to/from Stand Sit to Stand: Min assist         General transfer comment: Min assist to power to standing from EOB.  Ambulation/Gait Ambulation/Gait assistance: Min guard;+2 safety/equipment Gait Distance (Feet): 112 Feet Assistive device: Rolling walker (2 wheeled) Gait Pattern/deviations: Step-through pattern;Decreased stride length;Trunk flexed Gait velocity: decreased   General Gait Details: slow amb, required RW for safety and stability, Spo2 >88% 1L by [place]. Increased to 2L >90% spo2 at end of amb bout. Pt educated on pursed lip breathing during amb. Pt amb 72 feet, 45ft, 76ft with 2 standing and 1 seated rest break in between.   Stairs             Wheelchair Mobility    Modified Rankin (Stroke Patients Only)       Balance Overall balance assessment: Needs assistance Sitting-balance support: Feet supported;No upper extremity supported Sitting balance-Leahy Scale: Good Sitting balance - Comments: Prolonged time sitting EOB without instability to work on core strength and endurance.   Standing balance support: During functional activity Standing balance-Leahy Scale: Poor Standing balance comment: requires B UE support of walker and Min guard.                            Cognition Arousal/Alertness: Awake/alert Behavior During Therapy: Flat affect Overall Cognitive Status: Within Functional Limits for tasks assessed  General Comments: Pt reports he is fatigued with movement. Pt able to follow multistep directions with minimal verbal cues      Exercises      General Comments General comments (skin integrity, edema, etc.): Pt wound dressings on drain and chest tube appeared WNL with no visable drainage pre and post session.      Pertinent  Vitals/Pain Pain Assessment: Faces Faces Pain Scale: Hurts little more Pain Location: right side at chest tube insertion Pain Descriptors / Indicators: Sore;Grimacing;Guarding Pain Intervention(s): Limited activity within patient's tolerance;Monitored during session    Home Living                      Prior Function            PT Goals (current goals can now be found in the care plan section) Acute Rehab PT Goals Patient Stated Goal: none stated    Frequency    Min 3X/week      PT Plan Current plan remains appropriate    Co-evaluation              AM-PAC PT "6 Clicks" Mobility   Outcome Measure  Help needed turning from your back to your side while in a flat bed without using bedrails?: None Help needed moving from lying on your back to sitting on the side of a flat bed without using bedrails?: None Help needed moving to and from a bed to a chair (including a wheelchair)?: A Little Help needed standing up from a chair using your arms (e.g., wheelchair or bedside chair)?: A Little Help needed to walk in hospital room?: A Little Help needed climbing 3-5 steps with a railing? : A Lot 6 Click Score: 19    End of Session Equipment Utilized During Treatment: Oxygen Activity Tolerance: Patient tolerated treatment well Patient left: with call bell/phone within reach;in chair;with chair alarm set Nurse Communication: Mobility status PT Visit Diagnosis: Difficulty in walking, not elsewhere classified (R26.2);Pain     Time: [id number] PT Time Calculation (min) (ACUTE ONLY): 44 min  Charges:  $Gait Training: 8-22 mins $Therapeutic Activity: 23-37 mins                     [employer] SPT 01/11/2020    Rolland Porter 01/11/2020, 5:07 PM

## 2020-01-11 NOTE — Progress Notes (Signed)
Mobility Specialist: Progress Note    01/11/20 1602  Mobility  Activity Transferred:  Chair to bed  Level of Assistance Minimal assist, patient does 75% or more  Assistive Device None  Mobility Response Tolerated well  Mobility performed by Mobility specialist  $Mobility charge 1 Mobility   Pt tolerated transfer well. Pt was positioned in bed with call bell and phone within reach.   [facility] [place] Mobility Specialist

## 2020-01-11 NOTE — Progress Notes (Signed)
PROGRESS NOTE    Marco [name]  [id number] DOB: June 13, 1956 DOA: 12/27/2019 PCP: [name], MD   Brief Narrative:  64 y.o. male with a history of systolic and diastolic congestive heart failure, nonischemic cardiomyopathy, chronic kidney disease stage III, diabetes mellitus type 2, coronary artery disease, hypertension, obesity, obstructive sleep apnea, atrial fibrillation status post ablation 2017, psoriatic arthritis on daily prednisone. Patient presented secondary to lethargy and weakness and found to have evidence of sepsis from recurrent UTI vs prostatitis from recent UTI. Patient developed respiratory and hemodynamic failure requiring ICU admission, intubation/mechanical ventilation and vasopressor support. While admitted, he has had recurrent/persistent fevers on antibiotics. Infectious disease is on board. Respiratory status improved and he was extubated on 5/25 successfully. CT chest concerning for right loculated fluid which is possibly secondary to infection.  Chest tube placed 5/29, management per CT surgery.   Assessment & Plan:   Principal Problem:   Acute febrile illness Active Problems:   CAD (coronary artery disease)   Atrial fibrillation (HCC)   Essential hypertension   Chronic kidney disease   Morbid obesity due to excess calories (HCC)   Chronic pain   Type 2 diabetes mellitus with stage 3 chronic kidney disease, without long-term current use of insulin (HCC)   OSA (obstructive sleep apnea)   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HCC)   Diabetic polyneuropathy associated with type 2 diabetes mellitus (HCC)   Acute respiratory failure with hypoxia (HCC)   Chronic combined systolic and diastolic CHF (congestive heart failure) (HCC)   Hyperlipidemia   Urethral stricture   Nephrolithiasis   Complex renal cyst   Elevated LFTs   Pleural effusion   Weakness   Loculated pleural effusion   Sepsis secondary to right-sided pyelonephritis with infected cyst Right-sided  loculated effusion -Continue IV Rocephin, changed to Bactrim if feeling gets discharged with chest tube per ID -CT surgery following. -Right-sided chest tube placed 5/29-management per IR and CT surgery.  Status post first dose of thrombolytic 5/31, good response.  Continue to monitor output. -Status post thoracentesis 5/27- only yielding 25cc.  -CT-guided drain placed in the right renal cyst 5/23 -Follow fluid cultures.  Renal cyst cultures grew E. coli-MDR -Urology and ID signed off.  If spikes fever, repeat cultures  Acute respiratory failure with hypoxemia Extubated 5/25.  Remained stable.  Left maxillary sinus disease Chronic, incidental finding.  Noninfectious source per ID.  AKI on CKD stage IIIa Baseline creatinine of 1.5. Peak creatinine of 2.13 . Today 1.49  Chronic combined systolic and diastolic heart failure EF of 50-55% which is improved from last year. Grade 1 diastolic dysfunction noted.  Continue to monitor fluid status Continue ASA, Coreg  Rheumatoid arthritis Prednisone, Oxycodone.  Generalized pain -Continue Lyrica and Cymbalta. Titrate Lyrica for renal function  Anemia of chronic disease Hb ~ 8.3 In setting of kidney disease. Stable.  Hemoglobin today 7.7  Elevated LFTs; improving.  Likely secondary to hypotension which was noted from 5/23-5/24. RUQ ultrasound obtained with fatty infiltration of the liver. Hepatitis panel negative.  Gout Patient is on allopurinol which has been held secondary to AKI  Pressure injury Left/Right buttock. POA  Poor p.o. intake. Dietary has been following.  Reports that the patient has minimal oral intake for last 24 to 48 hours. Patient reportedly does not like nutrition supplements. At present dietary is recommending to consider core track if patient does not start eating. I do not see any mechanical issue with patient's oral intake based on my examination today.  DVT prophylaxis:  None Code Status:  Full Family Communication: None at bedside  Status is: Inpatient  Remains inpatient appropriate because:Hemodynamically unstable   Dispo: The patient is from: Home              Anticipated d/c is to: To be determined              Anticipated d/c date is: > 3 days              Patient currently is not medically stable to d/c.  Maintain hospital stay while chest tube in place and draining fluid.  Radiology studies-  CT head 5/18-negative for acute pathology.  Chronic sinusitis  Ultrasound pelvis 5/18-normal  CT abdomen pelvis without contrast 5/19-small bilateral pleural effusion, atelectasis, right-sided renal cyst/concerns for pyelonephritis  MRI brain without contrast 5/21-no acute findings.  Chronic sinusitis  Echo 5/22-EF 50-55%, grade 1 DD hypokinesia of the distal septum/apex  EKG 5/24-diffuse severe encephalopathy but no seizures  MRI pelvis 5/24-negative for acute findings  Procedures:   Intubated 5/23, extubated 5/25   Subjective: No nausea no vomiting.  Breathing still heavy.  No fever no chills.  Chest pain controlled with medicines.  Reports constipation.  Examination: Constitutional: in acute distress Respiratory: Bibasilar crackles.  Right-sided breath sounds improved. Cardiovascular: Normal sinus rhythm, no rubs Abdomen: Nontender nondistended good bowel sounds Musculoskeletal: No edema noted Skin: No rashes seen Neurologic: CN 2-12 grossly intact.  And nonfocal Psychiatric: Normal judgment and insight. Alert and oriented x 3. Normal mood. Right-sided drain noted. Right chest wall, chest tube noted. Left upper extremity midline in place  Objective: Vitals:   01/11/20 0600 01/11/20 0755 01/11/20 1219 01/11/20 1551  BP:  115/72 132/63 115/66  Pulse:  71 70 72  Resp: (!) 26 13 15 14   Temp:  98.5 F (36.9 C) 98.6 F (37 C) 98.2 F (36.8 C)  TempSrc:  Oral Oral Oral  SpO2:  94% 94% 96%  Weight: 103.5 kg     Height:        Intake/Output  Summary (Last 24 hours) at 01/11/2020 1920 Last data filed at 01/11/2020 1800 Gross per 24 hour  Intake 250 ml  Output 1100 ml  Net -850 ml   Filed Weights   01/09/20 0419 01/10/20 0433 01/11/20 0600  Weight: 107.8 kg 105.8 kg 103.5 kg     Data Reviewed:   CBC: Recent Labs  Lab 01/06/20 0621 01/08/20 0250 01/09/20 0256 01/10/20 0357 01/11/20 0540  WBC 13.4* 14.2* 14.6* 11.7* 11.0*  NEUTROABS 9.2*  --   --   --   --   HGB 8.0* 7.5* 7.7* 7.5* 7.4*  HCT 25.7* 24.0* 24.3* 24.5* 24.0*  MCV 91.8 90.9 89.7 91.1 90.6  PLT 454* 460* PLATELET CLUMPS NOTED ON SMEAR, UNABLE TO ESTIMATE 431* [id number]*   Basic Metabolic Panel: Recent Labs  Lab 01/06/20 0621 01/08/20 0250 01/09/20 0256 01/10/20 0357 01/11/20 0540  NA 144 143 143 141 139  K 4.3 4.4 4.5 4.5 4.5  CL 108 108 106 104 104  CO2 28 27 25 27 28   GLUCOSE 131* 142* 143* 133* 98  BUN 31* 24* 23 18 17   CREATININE 1.73* 1.61* 1.52* 1.49* 1.36*  CALCIUM 8.0* 8.3* 8.3* 8.3* 8.3*  MG 2.2 1.9 1.9 1.7 1.8  PHOS 2.5  --   --   --   --    GFR: Estimated Creatinine Clearance: 61.9 mL/min (A) (by C-G formula based on SCr of 1.36 mg/dL (H)). Liver Function Tests: Recent  Labs  Lab 01/05/20 0240 01/06/20 0621  AST 146* 44*  ALT 143* 89*  ALKPHOS 76 69  BILITOT 0.8 0.6  PROT 7.0 6.8  ALBUMIN 2.2* 2.0*   No results for input(s): LIPASE, AMYLASE in the last 168 hours. No results for input(s): AMMONIA in the last 168 hours. Coagulation Profile: No results for input(s): INR, PROTIME in the last 168 hours. Cardiac Enzymes: No results for input(s): CKTOTAL, CKMB, CKMBINDEX, TROPONINI in the last 168 hours. BNP (last 3 results) No results for input(s): PROBNP in the last 8760 hours. HbA1C: No results for input(s): HGBA1C in the last 72 hours. CBG: Recent Labs  Lab 01/11/20 0011 01/11/20 0503 01/11/20 0752 01/11/20 1215 01/11/20 1550  GLUCAP 133* 90 113* 164* 190*   Lipid Profile: No results for input(s): CHOL, HDL, LDLCALC,  TRIG, CHOLHDL, LDLDIRECT in the last 72 hours. Thyroid Function Tests: No results for input(s): TSH, T4TOTAL, FREET4, T3FREE, THYROIDAB in the last 72 hours. Anemia Panel: No results for input(s): VITAMINB12, FOLATE, FERRITIN, TIBC, IRON, RETICCTPCT in the last 72 hours. Sepsis Labs: Recent Labs  Lab 01/05/20 0240 01/06/20 0621  PROCALCITON 1.47 0.90    Recent Results (from the past 240 hour(s))  Culture, blood (Routine X 2) w Reflex to ID Panel     Status: None   Collection Time: 01/04/20 10:58 AM   Specimen: BLOOD  Result Value Ref Range Status   Specimen Description   Final    BLOOD LEFT ARM Performed at [facility] [address], [place], [place] [address]    Special Requests   Final    BOTTLES DRAWN AEROBIC AND ANAEROBIC Blood Culture adequate volume Performed at [facility] [address], [place], [place] [address]    Culture   Final    NO GROWTH 5 DAYS Performed at [facility], [place] [address], [place], [place] [address]    Report Status 01/09/2020 FINAL  Final  Culture, blood (Routine X 2) w Reflex to ID Panel     Status: None   Collection Time: 01/04/20 10:58 AM   Specimen: BLOOD  Result Value Ref Range Status   Specimen Description   Final    BLOOD LEFT ARM Performed at [facility] [address], [place], [place] [address]    Special Requests   Final    BOTTLES DRAWN AEROBIC AND ANAEROBIC Blood Culture adequate volume Performed at [facility] [address] [place], [place] [address]    Culture   Final    NO GROWTH 5 DAYS Performed at [facility], [place] [address], [place], [place] [address]    Report Status 01/09/2020 FINAL  Final  Body fluid culture     Status: None   Collection Time: 01/05/20  3:38 PM   Specimen: Lung, Right; Pleural Fluid  Result Value Ref Range Status   Specimen Description   Final    PLEURAL Performed at [facility] [address], [place], [place] [address]    Special Requests   Final    NONE Performed at [facility], [place] [address], [place], [place] [address]    Gram Stain   Final    FEW WBC PRESENT, PREDOMINANTLY PMN NO ORGANISMS SEEN    Culture   Final    NO GROWTH 3 DAYS Performed at [facility] [address], [place], [place] [address]    Report Status 01/09/2020 FINAL  Final  Fungus Culture With Stain     Status: None (Preliminary result)   Collection Time: 01/05/20  3:38 PM   Specimen: Lung, Right; Pleural Fluid  Result Value Ref Range Status   Fungus Stain Final report  Final    Comment: (NOTE) Performed At: [facility] [place], [place] [id number] Rush Farmer MD [id number]    Fungus (Mycology) Culture PENDING  Incomplete   Fungal Source PLEURAL  Final    Comment: Performed at [facility], [place] [address], [place], [place] [address]  Fungus Culture Result     Status: None   Collection Time: 01/05/20  3:38 PM  Result Value Ref Range Status   Result 1 Comment  Final    Comment: (NOTE) KOH/Calcofluor preparation:  no fungus observed. Performed At: [facility] [place], [place] [id number] Rush Farmer MD [id number]          Radiology Studies: DG CHEST PORT 1 VIEW  Result Date: 01/11/2020 CLINICAL DATA:  Chest tube EXAM: PORTABLE CHEST 1 VIEW COMPARISON:  Chest radiograph 01/10/2020 FINDINGS: Stable positioning of a right pigtail chest tube. No pneumothorax. Persistent right greater than left bibasilar atelectasis. No large pleural effusion. Stable cardiomediastinal contours with enlarged heart size. IMPRESSION: Stable chest tube position. No pneumothorax. Electronically Signed   By: Audie Pinto M.D.   On: 01/11/2020 08:34   DG Chest Port 1 View  Result Date: 01/10/2020 CLINICAL DATA:  64 year old male with right side chest tube for treatment of a fusion.  EXAM: PORTABLE CHEST 1 VIEW COMPARISON:  Portable chest 01/08/2020 and earlier. FINDINGS: Portable AP semi upright view at 1044 hours. Stable pigtail right chest tube. No pneumothorax identified. Ventilation has further improved at the right lung base, with streaky residual opacity. Stable cardiac size and mediastinal contours. Stable mild left lung base atelectasis. Visualized tracheal air column is within normal limits. No acute osseous abnormality identified. IMPRESSION: 1. Stable right chest tube with further improved right lung base ventilation and no pneumothorax identified. 2. No new cardiopulmonary abnormality. Electronically Signed   By: Genevie Ann M.D.   On: 01/10/2020 10:58        Scheduled Meds: . alteplase (TPA) for intrapleural administration  10 mg Intrapleural Q12H   And  . pulmozyme (DORNASE) for intrapleural administration  5 mg Intrapleural Q12H  . aspirin EC  81 mg Oral QHS  . carvedilol  12.5 mg Oral BID WC  . Chlorhexidine Gluconate Cloth  6 each Topical Daily  . DULoxetine  20 mg Oral Daily  . famotidine  20 mg Oral QHS  . fluticasone  2 spray Each Nare Daily  . heparin injection (subcutaneous)  5,000 Units Subcutaneous Q8H  . insulin aspart  0-15 Units Subcutaneous TID WC  . insulin aspart  0-5 Units Subcutaneous QHS  . mouth rinse  15 mL Mouth Rinse BID  . multivitamin with minerals  1 tablet Oral Daily  . polyethylene glycol  17 g Oral Daily  . predniSONE  5 mg Oral Q breakfast  . pregabalin  100 mg Oral BID  . senna-docusate  2 tablet Oral BID  . sodium chloride flush  10-40 mL Intracatheter Q12H  . sodium chloride flush  5 mL Intravenous Q8H   Continuous Infusions: . sodium chloride Stopped (12/30/19 1700)  . cefTRIAXone (ROCEPHIN)  IV 2 g (01/11/20 1241)     LOS: 15 days   Time spent= 35 mins    Berle Mull, MD [facility]  If 7PM-7AM, please contact night-coverage  01/11/2020, 7:20 PM

## 2020-01-11 NOTE — Progress Notes (Addendum)
[address]       [place] [address]             [phone]        Procedure(s) (LRB): VIDEO ASSISTED THORACOSCOPY (VATS)/DECORTICATION (Right) Subjective: C/o pain after thrombolytics, says breathing is more comfortable  Objective: Vital signs in last 24 hours: Temp:  [98 F (36.7 C)-99.1 F (37.3 C)] 98.5 F (36.9 C) (06/02 0755) Pulse Rate:  [68-72] 71 (06/02 0755) Cardiac Rhythm: Normal sinus rhythm (06/02 0842) Resp:  [12-26] 13 (06/02 0755) BP: (114-124)/(45-72) 115/72 (06/02 0755) SpO2:  [92 %-95 %] 94 % (06/02 0755) Weight:  [103.5 kg] 103.5 kg (06/02 0600)  Hemodynamic parameters for last 24 hours:    Intake/Output from previous day: 06/01 0701 - 06/02 0700 In: 10  Out: 1130 [Urine:900; Chest Tube:230] Intake/Output this shift: Total I/O In: 10 [I.V.:10] Out: -   General appearance: alert, cooperative and no distress Heart: regular rate and rhythm Lungs: mildly dim in right base Abdomen: benign Extremities: no edema Wound: dressings clean and intact  Lab Results: Recent Labs    01/10/20 0357 01/11/20 0540  WBC 11.7* 11.0*  HGB 7.5* 7.4*  HCT 24.5* 24.0*  PLT 431* 471*   BMET:  Recent Labs    01/10/20 0357 01/11/20 0540  NA 141 139  K 4.5 4.5  CL 104 104  CO2 27 28  GLUCOSE 133* 98  BUN 18 17  CREATININE 1.49* 1.36*  CALCIUM 8.3* 8.3*    PT/INR: No results for input(s): LABPROT, INR in the last 72 hours. ABG    Component Value Date/Time   PHART 7.453 (H) 01/02/2020 0355   HCO3 24.6 01/02/2020 0355   TCO2 25 05/19/2018 1430   ACIDBASEDEF 3.3 (H) 12/31/2019 0908   O2SAT 97.1 01/02/2020 0355   CBG (last 3)  Recent Labs    01/11/20 0011 01/11/20 0503 01/11/20 0752  GLUCAP 133* 90 113*    Meds Scheduled Meds: . alteplase (TPA) for intrapleural administration  10 mg Intrapleural Q12H   And  . pulmozyme (DORNASE) for intrapleural administration  5 mg Intrapleural Q12H  . aspirin EC  81 mg Oral QHS    . carvedilol  12.5 mg Oral BID WC  . Chlorhexidine Gluconate Cloth  6 each Topical Daily  . DULoxetine  20 mg Oral Daily  . famotidine  20 mg Oral QHS  . feeding supplement (ENSURE ENLIVE)  237 mL Oral Q24H  . feeding supplement (PRO-STAT SUGAR FREE 64)  30 mL Oral BID  . fluticasone  2 spray Each Nare Daily  . heparin injection (subcutaneous)  5,000 Units Subcutaneous Q8H  . insulin aspart  2-6 Units Subcutaneous Q4H  . mouth rinse  15 mL Mouth Rinse BID  . multivitamin with minerals  1 tablet Oral Daily  . nutrition supplement (JUVEN)  1 packet Oral BID BM  . polyethylene glycol  17 g Oral Daily  . potassium chloride SA  20 mEq Oral Daily  . predniSONE  5 mg Oral Q breakfast  . pregabalin  100 mg Oral BID  . sodium chloride flush  10-40 mL Intracatheter Q12H  . sodium chloride flush  5 mL Intravenous Q8H   Continuous Infusions: . sodium chloride Stopped (12/30/19 1700)  . cefTRIAXone (ROCEPHIN)  IV 2 g (01/10/20 1115)   PRN Meds:.sodium chloride, acetaminophen, albuterol, fentaNYL (SUBLIMAZE) injection, hydrALAZINE, oxyCODONE-acetaminophen, polyethylene glycol, senna-docusate, sodium chloride flush  Xrays DG CHEST PORT 1 VIEW  Result Date:  01/11/2020 CLINICAL DATA:  Chest tube EXAM: PORTABLE CHEST 1 VIEW COMPARISON:  Chest radiograph 01/10/2020 FINDINGS: Stable positioning of a right pigtail chest tube. No pneumothorax. Persistent right greater than left bibasilar atelectasis. No large pleural effusion. Stable cardiomediastinal contours with enlarged heart size. IMPRESSION: Stable chest tube position. No pneumothorax. Electronically Signed   By: Audie Pinto M.D.   On: 01/11/2020 08:34   DG Chest Port 1 View  Result Date: 01/10/2020 CLINICAL DATA:  64 year old male with right side chest tube for treatment of a fusion. EXAM: PORTABLE CHEST 1 VIEW COMPARISON:  Portable chest 01/08/2020 and earlier. FINDINGS: Portable AP semi upright view at 1044 hours. Stable pigtail right chest  tube. No pneumothorax identified. Ventilation has further improved at the right lung base, with streaky residual opacity. Stable cardiac size and mediastinal contours. Stable mild left lung base atelectasis. Visualized tracheal air column is within normal limits. No acute osseous abnormality identified. IMPRESSION: 1. Stable right chest tube with further improved right lung base ventilation and no pneumothorax identified. 2. No new cardiopulmonary abnormality. Electronically Signed   By: Genevie Ann M.D.   On: 01/10/2020 10:58    Assessment/Plan: S/P Procedure(s) (LRB): VIDEO ASSISTED THORACOSCOPY (VATS)/DECORTICATION (Right)  1 afeb, VSS 2 sats good on 2 liters 3 pain after second round of chest thrombolytics, CXR conts to improve in appearance, drained 230 cc yesterday. 4 medical management per primary svc  LOS: 15 days    Marco Giovanni  [name] Pager [phone] 01/11/2020   Agree with above Patient doing well. Chest x-ray much improved We will likely remove drain tomorrow. Continue pulmonary toilet

## 2020-01-11 NOTE — Progress Notes (Signed)
Nutrition Follow-up  DOCUMENTATION CODES:   Obesity unspecified  INTERVENTION:   Recommend Cortrak placement Friday as intake has been inadequate for >7 days.    Discontinue current supplements per pt   Try Magic cup TID with meals, each supplement provides 290 kcal and 9 grams of protein  MVI daily   NUTRITION DIAGNOSIS:   Increased nutrient needs related to acute illness, wound healing as evidenced by estimated needs.   Ongoing  GOAL:   Patient will meet greater than or equal to 90% of their needs   Not meeting   MONITOR:   PO intake, Supplement acceptance, Labs, Weight trends, Skin  ASSESSMENT:   64 y.o. male with medical history of CHF, non-ischemic cardiomyopathy, stage 3 CKD, type 2 DM, CAD, HTN, obesity, OSA, A. fib s/p ablation in 2017, and psoriatic arthritis on daily prednisone. He presented to the ED with complaints of weakness, lethargy, and multiple falls at home. He was admitted earlier in the month for pyelonephritis. Since discharge he has been experiencing urinary frequency, dysuria, and incontinence. He is being admitted for UTI/prostatitis.  5/23- intubation, s/p R renal drain placed  5/25- extubation 5/27- s/p thoracentesis- 25 ml drained  5/29- R chest tube placed  RD working remotely.   Pt's intake remains inadequate. Reports he finishes ~25% of each meal. Meal completions charted as 0-50% for his last eight meals. Does not like any supplements provided. Recommend Cortrak as intake has not progressed this admission.   Admission weight: 110.2 kg  Current weight: 103.5 kg   I/O: -8,589 ml since 5/19 UOP: 900 ml x 24 hrs  Chest tube: 230 ml x 24 hrs   Medications: SS novolog, MVI with minerals, miralax, 20 mEq KCl daily, prednisone  Labs: CBG 90-182  Diet Order:   Diet Order            Diet heart healthy/carb modified Room service appropriate? Yes; Fluid consistency: Thin  Diet effective now              EDUCATION NEEDS:   No  education needs have been identified at this time  Skin:  Skin Assessment: Skin Integrity Issues: Skin Integrity Issues:: Unstageable Unstageable: full thickness to bilateral buttocks  Last BM:  5/31  Height:   Ht Readings from Last 1 Encounters:  01/01/20 5\' 6"  (1.676 m)    Weight:   Wt Readings from Last 1 Encounters:  01/11/20 103.5 kg    BMI:  Body mass index is 36.83 kg/m.  Estimated Nutritional Needs:   Kcal:  2100-2300 kcal  Protein:  105-120 grams  Fluid:  >/= 2 L/day   Mariana Single RD, LDN Clinical Nutrition Pager listed in [place]

## 2020-01-12 ENCOUNTER — Inpatient Hospital Stay (HOSPITAL_COMMUNITY): Payer: Medicaid Other

## 2020-01-12 LAB — BASIC METABOLIC PANEL
Anion gap: 8 (ref 5–15)
BUN: 15 mg/dL (ref 8–23)
CO2: 27 mmol/L (ref 22–32)
Calcium: 8.1 mg/dL — ABNORMAL LOW (ref 8.9–10.3)
Chloride: 103 mmol/L (ref 98–111)
Creatinine, Ser: 1.32 mg/dL — ABNORMAL HIGH (ref 0.61–1.24)
GFR calc Af Amer: >60 mL/min (ref >60)
GFR calc non Af Amer: 57 mL/min — ABNORMAL LOW (ref >60)
Glucose, Bld: 95 mg/dL (ref 70–99)
Potassium: 4.3 mmol/L (ref 3.5–5.1)
Sodium: 138 mmol/L (ref 135–145)

## 2020-01-12 LAB — MAGNESIUM: Magnesium: 1.7 mg/dL (ref 1.7–2.4)

## 2020-01-12 LAB — CBC
HCT: 24.3 % — ABNORMAL LOW (ref 39.0–52.0)
Hemoglobin: 7.6 g/dL — ABNORMAL LOW (ref 13.0–17.0)
MCH: 28.3 pg (ref 26.0–34.0)
MCHC: 31.3 g/dL (ref 30.0–36.0)
MCV: 90.3 fL (ref 80.0–100.0)
Platelets: 479 10*3/uL — ABNORMAL HIGH (ref 150–400)
RBC: 2.69 MIL/uL — ABNORMAL LOW (ref 4.22–5.81)
RDW: 17.3 % — ABNORMAL HIGH (ref 11.5–15.5)
WBC: 11.8 10*3/uL — ABNORMAL HIGH (ref 4.0–10.5)
nRBC: 0.2 % (ref 0.0–0.2)

## 2020-01-12 LAB — GLUCOSE, CAPILLARY
Glucose-Capillary: 93 mg/dL (ref 70–99)
Glucose-Capillary: 166 mg/dL — ABNORMAL HIGH (ref 70–99)
Glucose-Capillary: 128 mg/dL — ABNORMAL HIGH (ref 70–99)
Glucose-Capillary: 112 mg/dL — ABNORMAL HIGH (ref 70–99)

## 2020-01-12 MED ORDER — MAGNESIUM SULFATE 2 GM/50ML IV SOLN
2.0000 g | Freq: Once | INTRAVENOUS | Status: AC
  Administered 2020-01-12: 2 g via INTRAVENOUS
  Filled 2020-01-12: qty 50

## 2020-01-12 NOTE — Progress Notes (Signed)
Occupational Therapy Treatment Patient Details Name: Marco [name] MRN: [id number] DOB: 16-Jan-1956 Today's Date: 01/12/2020    History of present illness Patient is a 64 y/o male who presents with E-Coli right pyelonephritis.  Initially admitted with concern for sepsis with fever, leukocytosis and elevated lactic acid. Work up concerning for possible infected right renal cyst with associated right renal pyelonephritis.  He had progressive agitated delirium, fevers despite antibiotics.  IR drained renal cyst 5/23 with bloody fluid returned.  Ultimately, required intubation on 5/23 for agitated delirium / impending respiratory failure.  Extubated 5/25. s/p CT placement 5/29 with plan for thrombolytics.   OT comments  Pt making marked improvement from OT eval, states he would like to go home with wife support. Session focused on safe d/c planning. Pt completed bed mobility with min guard assist and sit <> stand with mod A +2. Pt requires more assist to rise from low surfaces. He was able to complete functional mobility a household distance, reports this to be very difficult and strenuous. Pt required 1-2L [place] to maintain SpO2 > 88%. Educated pt on reading SpO2 sats and ECS techniques. Wife present for session and supportive. Will continue to follow. Updated POC for pt to return home with [facility] and wife support.    Follow Up Recommendations  Home health OT;Supervision/Assistance - 24 hour    Equipment Recommendations  3 in 1 bedside commode    Recommendations for Other Services      Precautions / Restrictions Precautions Precautions: Fall Precaution Comments: watch o2 with amb Restrictions Weight Bearing Restrictions: No       Mobility Bed Mobility Overal bed mobility: Needs Assistance Bed Mobility: Supine to Sit     Supine to sit: HOB elevated;Min guard     General bed mobility comments: Pt required min guard for sitting EOB, with assistance for lines.  Transfers Overall transfer  level: Needs assistance Equipment used: Rolling walker (2 wheeled) Transfers: Sit to/from Stand Sit to Stand: Mod assist;+2 physical assistance         General transfer comment: Mod(A)+2 for power up with sit to stand, pt min(A) about halfway through movement    Balance Overall balance assessment: Needs assistance Sitting-balance support: Feet supported;No upper extremity supported Sitting balance-Leahy Scale: Good     Standing balance support: Bilateral upper extremity supported;During functional activity Standing balance-Leahy Scale: Poor Standing balance comment: requires B UE support of walker and Min guard.                           ADL either performed or assessed with clinical judgement   ADL Overall ADL's : Needs assistance/impaired                     Lower Body Dressing: Moderate assistance;Sitting/lateral leans;Sit to/from stand   Toilet Transfer: Moderate assistance;+2 for safety/equipment;RW Toilet Transfer Details (indicate cue type and reason): mod A to rise from low surface         Functional mobility during ADLs: Min guard;Rolling walker General ADL Comments: pt showed marked improvement from original OT eval, discussed d/c home and safety with DME usage with wife     Vision Patient Visual Report: No change from baseline     Perception     Praxis      Cognition Arousal/Alertness: Awake/alert Behavior During Therapy: Flat affect Overall Cognitive Status: Within Functional Limits for tasks assessed  General Comments: Pt reports he is fatigued with mobility. Pt able to follow multistep directions with minimal verbal cues. Wife present and engaged in session        Exercises     Shoulder Instructions       General Comments Pt wife present and engaged in session. Pt and wife both feel comfortable with pt at home.    Pertinent Vitals/ Pain       Pain Assessment: Faces Faces  Pain Scale: Hurts little more Pain Location: lower back on the left side, reports this pain has been going on for a while Pain Descriptors / Indicators: Sore;Grimacing;Guarding Pain Intervention(s): Monitored during session  Home Living                                          Prior Functioning/Environment              Frequency  Min 2X/week        Progress Toward Goals  OT Goals(current goals can now be found in the care plan section)  Progress towards OT goals: Progressing toward goals  Acute Rehab OT Goals Patient Stated Goal: to go home with wife support OT Goal Formulation: With patient/family Time For Goal Achievement: 01/19/20 Potential to Achieve Goals: Good  Plan Discharge plan needs to be updated    Co-evaluation    PT/OT/SLP Co-Evaluation/Treatment: Yes Reason for Co-Treatment: Other (comment)(refused, poor tolerance) PT goals addressed during session: Mobility/safety with mobility;Proper use of DME OT goals addressed during session: ADL's and self-care;Proper use of Adaptive equipment and DME;Strengthening/ROM      AM-PAC OT "6 Clicks" Daily Activity     Outcome Measure   Help from another person eating meals?: None Help from another person taking care of personal grooming?: A Little Help from another person toileting, which includes using toliet, bedpan, or urinal?: A Lot Help from another person bathing (including washing, rinsing, drying)?: A Lot Help from another person to put on and taking off regular upper body clothing?: A Little Help from another person to put on and taking off regular lower body clothing?: A Lot 6 Click Score: 16    End of Session Equipment Utilized During Treatment: Rolling walker;Oxygen  OT Visit Diagnosis: Other abnormalities of gait and mobility (R26.89);Muscle weakness (generalized) (M62.81);Pain Pain - part of body: (back)   Activity Tolerance Patient tolerated treatment well   Patient Left in  chair;with call bell/phone within reach;with family/visitor present   Nurse Communication Mobility status        Time: [id number] OT Time Calculation (min): 24 min  Charges: OT General Charges $OT Visit: 1 Visit OT Treatments $Self Care/Home Management : 8-22 mins  Zenovia Jarred, MSOT, OTR/L [place] [facility] Office Number: [phone] Pager: [phone]  Zenovia Jarred 01/12/2020, 5:53 PM

## 2020-01-12 NOTE — Progress Notes (Signed)
SATURATION QUALIFICATIONS: (This note is used to comply with regulatory documentation for home oxygen)  Patient Saturations on Room Air at Rest = 90%  Patient Saturations on Room Air while Ambulating = 87%  Patient Saturations on 2 Liters of oxygen while Ambulating = 93%  Please briefly explain why patient needs home oxygen: Pt requires supplemental O2 to maintain spo2 >/ 88% with amb.   Rolland Porter SPT 01/12/2020

## 2020-01-12 NOTE — Discharge Instructions (Signed)
Pleural Effusion Pleural effusion is an abnormal buildup of fluid in the layers of tissue between the lungs and the inside of the chest (pleural space) The two layers of tissue that line the lungs and the inside of the chest are called pleura. Usually, there is no air in the space between the pleura, only a thin layer of fluid. Some conditions can cause a large amount of fluid to build up, which can cause the lung to collapse if untreated. A pleural effusion is usually caused by another disease that requires treatment. What are the causes? Pleural effusion can be caused by:  Heart failure.  Certain infections, such as pneumonia or tuberculosis.  Cancer.  A blood clot in the lung (pulmonary embolism).  Complications from surgery, such as from open heart surgery.  Liver disease (cirrhosis).  Kidney disease. What are the signs or symptoms? In some cases, pleural effusion may cause no symptoms. If symptoms are present, they may include:  Shortness of breath, especially when lying down.  Chest pain. This may get worse when taking a deep breath.  Fever.  Dry, long-lasting (chronic) cough.  Hiccups.  Rapid breathing. An underlying condition that is causing the pleural effusion (such as heart failure, pneumonia, blood clots, tuberculosis, or cancer) may also cause other symptoms. How is this diagnosed? This condition may be diagnosed based on:  Your symptoms and medical history.  A physical exam.  A chest X-ray.  A procedure to use a needle to remove fluid from the pleural space (thoracentesis). This fluid is tested.  Other imaging studies of the chest, such as ultrasound or CT scan. How is this treated? Depending on the cause of your condition, treatment may include:  Treating the underlying condition that is causing the effusion. When that condition improves, the effusion will also improve. Examples of treatment for underlying conditions include: ? Antibiotic medicines to  treat an infection. ? Diuretics or other heart medicines to treat heart failure.  Thoracentesis.  Placing a thin flexible tube under your skin and into your chest to continuously drain the effusion (indwelling pleural catheter).  Surgery to remove the outer layer of tissue from the pleural space (decortication).  A procedure to put medicine into the chest cavity to seal the pleural space and prevent fluid buildup (pleurodesis).  Chemotherapy and radiation therapy, if you have cancerous (malignant) pleural effusion. These treatments are typically used to treat cancer. They kill certain cells in the body. Follow these instructions at home:  Take over-the-counter and prescription medicines only as told by your health care provider.  Ask your health care provider what activities are safe for you.  Keep track of how long you are able to do mild exercise (such as walking) before you get short of breath. Write down this information to share with your health care provider. Your ability to exercise should improve over time.  Do not use any products that contain nicotine or tobacco, such as cigarettes and e-cigarettes. If you need help quitting, ask your health care provider.  Keep all follow-up visits as told by your health care provider. This is important. Contact a health care provider if:  The amount of time that you are able to do mild exercise: ? Decreases. ? Does not improve with time.  You have a fever. Get help right away if:  You are short of breath.  You develop chest pain.  You develop a new cough. Summary  Pleural effusion is an abnormal buildup of fluid in the layers   of tissue between the lungs and the inside of the chest.  Pleural effusion can have many causes, including heart failure, pulmonary embolism, infections, or cancer.  Symptoms of pleural effusion can include shortness of breath, chest pain, fever, long-lasting (chronic) cough, hiccups, or rapid  breathing.  Diagnosis often involves making images of the chest (such as with ultrasound or X-ray) and removing fluid (thoracentesis) to send for testing.  Treatment for pleural effusion depends on what underlying condition is causing it. This information is not intended to replace advice given to you by your health care provider. Make sure you discuss any questions you have with your health care provider. Document Revised: 07/10/2017 Document Reviewed: 04/02/2017 [employer] Patient Education  2020 [employer]  

## 2020-01-12 NOTE — Progress Notes (Signed)
PROGRESS NOTE    Marco [name]  [id number] DOB: 01-17-1956 DOA: 12/27/2019 PCP: [name], MD   Brief Narrative:  64 y.o. male with a history of systolic and diastolic congestive heart failure, nonischemic cardiomyopathy, chronic kidney disease stage III, diabetes mellitus type 2, coronary artery disease, hypertension, obesity, obstructive sleep apnea, atrial fibrillation status post ablation 2017, psoriatic arthritis on daily prednisone. Patient presented secondary to lethargy and weakness and found to have evidence of sepsis from recurrent UTI vs prostatitis from recent UTI. Patient developed respiratory and hemodynamic failure requiring ICU admission, intubation/mechanical ventilation and vasopressor support. While admitted, he has had recurrent/persistent fevers on antibiotics. Infectious disease is on board. Respiratory status improved and he was extubated on 5/25 successfully. CT chest concerning for right loculated fluid which is possibly secondary to infection.  Chest tube placed 5/29, management per CT surgery.   Assessment & Plan:   Principal Problem:   Acute febrile illness Active Problems:   CAD (coronary artery disease)   Atrial fibrillation (HCC)   Essential hypertension   Chronic kidney disease   Morbid obesity due to excess calories (HCC)   Chronic pain   Type 2 diabetes mellitus with stage 3 chronic kidney disease, without long-term current use of insulin (HCC)   OSA (obstructive sleep apnea)   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HCC)   Diabetic polyneuropathy associated with type 2 diabetes mellitus (HCC)   Acute respiratory failure with hypoxia (HCC)   Chronic combined systolic and diastolic CHF (congestive heart failure) (HCC)   Hyperlipidemia   Urethral stricture   Nephrolithiasis   Complex renal cyst   Elevated LFTs   Pleural effusion   Weakness   Loculated pleural effusion   Sepsis secondary to right-sided pyelonephritis with infected cyst Right-sided  loculated effusion -Continue IV Rocephin, changed to Bactrim if feeling gets discharged with chest tube per ID -CT surgery following. -Right-sided chest tube placed 5/29-management per IR and CT surgery.  Status post first dose of thrombolytic 5/31, good response.  Continue to monitor output. -Status post thoracentesis 5/27- only yielding 25cc.  -CT-guided drain placed in the right renal cyst 5/23 -Follow fluid cultures.  Renal cyst cultures grew E. coli-MDR -Urology and ID signed off.  If spikes fever, repeat cultures -Currently all drains removed.  Chest tube removed.  Acute respiratory failure with hypoxemia Extubated 5/25.  Remained stable.  Left maxillary sinus disease Chronic, incidental finding.  Noninfectious source per ID.  AKI on CKD stage IIIa Baseline creatinine of 1.5. Peak creatinine of 2.13 . Today 1.49  Chronic combined systolic and diastolic heart failure EF of 50-55% which is improved from last year. Grade 1 diastolic dysfunction noted.  Continue to monitor fluid status Continue ASA, Coreg  Rheumatoid arthritis Prednisone, Oxycodone.  Generalized pain -Continue Lyrica and Cymbalta. Titrate Lyrica for renal function  Anemia of chronic disease Hb ~ 8.3 In setting of kidney disease. Stable.  Hemoglobin today 7.7  Elevated LFTs; improving.  Likely secondary to hypotension which was noted from 5/23-5/24. RUQ ultrasound obtained with fatty infiltration of the liver. Hepatitis panel negative.  Gout Patient is on allopurinol which has been held secondary to AKI  Pressure injury Left/Right buttock. POA  Poor p.o. intake. Constipation Dietary has been following.  Improving oral intake.  DVT prophylaxis: None Code Status: Full Family Communication: None at bedside  Status is: Inpatient  Remains inpatient appropriate because:Hemodynamically unstable   Dispo: The patient is from: Home  Anticipated d/c is to: Home with home health               Anticipated d/c date is: 2-3              Patient currently is not medically stable to d/c.  To monitor for resolution of empyema  Radiology studies-  CT head 5/18-negative for acute pathology.  Chronic sinusitis  Ultrasound pelvis 5/18-normal  CT abdomen pelvis without contrast 5/19-small bilateral pleural effusion, atelectasis, right-sided renal cyst/concerns for pyelonephritis  MRI brain without contrast 5/21-no acute findings.  Chronic sinusitis  Echo 5/22-EF 50-55%, grade 1 DD hypokinesia of the distal septum/apex  EKG 5/24-diffuse severe encephalopathy but no seizures  MRI pelvis 5/24-negative for acute findings  Procedures:   Intubated 5/23, extubated 5/25   Subjective: Constipation still present.  Breathing improving.  No nausea no vomiting.  No fever no chills.  No chest pain.  Examination: Constitutional: in acute distress Respiratory: Bibasilar crackles.  Right-sided breath sounds improved. Cardiovascular: Normal sinus rhythm, no rubs Abdomen: Nontender nondistended good bowel sounds Musculoskeletal: No edema noted Skin: No rashes seen Neurologic: CN 2-12 grossly intact.  And nonfocal Psychiatric: Normal judgment and insight. Alert and oriented x 3. Normal mood. Right-sided drain noted. Right chest wall, chest tube noted. Left upper extremity midline in place  Objective: Vitals:   01/12/20 0802 01/12/20 1200 01/12/20 1622 01/12/20 1948  BP: 133/70 130/74 (!) 141/74 129/64  Pulse: 67 74 74 89  Resp: 18 18 18 15   Temp: 98.3 F (36.8 C) 98.2 F (36.8 C) 98.2 F (36.8 C) 98.2 F (36.8 C)  TempSrc: Oral Oral Oral Oral  SpO2: 97% 95% 96% 94%  Weight:      Height:        Intake/Output Summary (Last 24 hours) at 01/12/2020 2033 Last data filed at 01/12/2020 2005 Gross per 24 hour  Intake 770 ml  Output 550 ml  Net 220 ml   Filed Weights   01/09/20 0419 01/10/20 0433 01/11/20 0600  Weight: 107.8 kg 105.8 kg 103.5 kg     Data Reviewed:    CBC: Recent Labs  Lab 01/06/20 0621 01/06/20 0621 01/08/20 0250 01/09/20 0256 01/10/20 0357 01/11/20 0540 01/12/20 0254  WBC 13.4*   < > 14.2* 14.6* 11.7* 11.0* 11.8*  NEUTROABS 9.2*  --   --   --   --   --   --   HGB 8.0*   < > 7.5* 7.7* 7.5* 7.4* 7.6*  HCT 25.7*   < > 24.0* 24.3* 24.5* 24.0* 24.3*  MCV 91.8   < > 90.9 89.7 91.1 90.6 90.3  PLT 454*   < > 460* PLATELET CLUMPS NOTED ON SMEAR, UNABLE TO ESTIMATE 431* 471* 479*   < > = values in this interval not displayed.   Basic Metabolic Panel: Recent Labs  Lab 01/06/20 0621 01/06/20 0621 01/08/20 0250 01/09/20 0256 01/10/20 0357 01/11/20 0540 01/12/20 0254  NA 144   < > 143 143 141 139 138  K 4.3   < > 4.4 4.5 4.5 4.5 4.3  CL 108   < > 108 106 104 104 103  CO2 28   < > 27 25 27 28 27   GLUCOSE 131*   < > 142* 143* 133* 98 95  BUN 31*   < > 24* 23 18 17 15   CREATININE 1.73*   < > 1.61* 1.52* 1.49* 1.36* 1.32*  CALCIUM 8.0*   < > 8.3* 8.3* 8.3* 8.3* 8.1*  MG 2.2   < > 1.9 1.9 1.7 1.8 1.7  PHOS 2.5  --   --   --   --   --   --    < > = values in this interval not displayed.   GFR: Estimated Creatinine Clearance: 63.7 mL/min (A) (by C-G formula based on SCr of 1.32 mg/dL (H)). Liver Function Tests: Recent Labs  Lab 01/06/20 0621  AST 44*  ALT 89*  ALKPHOS 69  BILITOT 0.6  PROT 6.8  ALBUMIN 2.0*   No results for input(s): LIPASE, AMYLASE in the last 168 hours. No results for input(s): AMMONIA in the last 168 hours. Coagulation Profile: No results for input(s): INR, PROTIME in the last 168 hours. Cardiac Enzymes: No results for input(s): CKTOTAL, CKMB, CKMBINDEX, TROPONINI in the last 168 hours. BNP (last 3 results) No results for input(s): PROBNP in the last 8760 hours. HbA1C: No results for input(s): HGBA1C in the last 72 hours. CBG: Recent Labs  Lab 01/11/20 1550 01/11/20 2206 01/12/20 0625 01/12/20 1213 01/12/20 1705  GLUCAP 190* 123* 93 166* 128*   Lipid Profile: No results for input(s):  CHOL, HDL, LDLCALC, TRIG, CHOLHDL, LDLDIRECT in the last 72 hours. Thyroid Function Tests: No results for input(s): TSH, T4TOTAL, FREET4, T3FREE, THYROIDAB in the last 72 hours. Anemia Panel: No results for input(s): VITAMINB12, FOLATE, FERRITIN, TIBC, IRON, RETICCTPCT in the last 72 hours. Sepsis Labs: Recent Labs  Lab 01/06/20 [id number]  PROCALCITON 0.90    Recent Results (from the past 240 hour(s))  Culture, blood (Routine X 2) w Reflex to ID Panel     Status: None   Collection Time: 01/04/20 10:58 AM   Specimen: BLOOD  Result Value Ref Range Status   Specimen Description   Final    BLOOD LEFT ARM Performed at [facility] [address], [place], [place] [address]    Special Requests   Final    BOTTLES DRAWN AEROBIC AND ANAEROBIC Blood Culture adequate volume Performed at [facility] [address], [place], [place] [address]    Culture   Final    NO GROWTH 5 DAYS Performed at [facility], [place] [address] [place], [place] [address]    Report Status 01/09/2020 FINAL  Final  Culture, blood (Routine X 2) w Reflex to ID Panel     Status: None   Collection Time: 01/04/20 10:58 AM   Specimen: BLOOD  Result Value Ref Range Status   Specimen Description   Final    BLOOD LEFT ARM Performed at [facility] [address] [place], [place] [address]    Special Requests   Final    BOTTLES DRAWN AEROBIC AND ANAEROBIC Blood Culture adequate volume Performed at [facility] [address], [place], [place] [address]    Culture   Final    NO GROWTH 5 DAYS Performed at [facility], [place] [address] [place], [place] [address]    Report Status 01/09/2020 FINAL  Final  Body fluid culture     Status: None   Collection Time: 01/05/20  3:38 PM   Specimen: Lung, Right; Pleural Fluid  Result Value Ref Range Status   Specimen Description   Final    PLEURAL Performed at [facility] [address] [place], [place] [address]    Special Requests   Final    NONE Performed at [facility], [place] [address] [place], [place]  [id number]    Gram Stain   Final    FEW WBC PRESENT, PREDOMINANTLY PMN NO ORGANISMS SEEN    Culture   Final    NO GROWTH 3 DAYS Performed at [facility] [address] [place], [place] [address]    Report Status 01/09/2020 FINAL  Final  Fungus Culture With Stain     Status: None (Preliminary result)   Collection Time: 01/05/20  3:38 PM   Specimen: Lung, Right; Pleural Fluid  Result Value Ref Range Status   Fungus Stain Final report  Final    Comment: (NOTE) Performed At: [facility] [place], [place] [id number] Rush Farmer MD [id number]    Fungus (Mycology) Culture PENDING  Incomplete   Fungal Source PLEURAL  Final    Comment: Performed at [facility], [place] [address] [place], [place] [address]  Fungus Culture Result     Status: None   Collection Time: 01/05/20  3:38 PM  Result Value Ref Range Status   Result 1 Comment  Final    Comment: (NOTE) KOH/Calcofluor preparation:  no fungus observed. Performed At: [facility] [place], [place] [id number] Rush Farmer MD [id number]          Radiology Studies: DG CHEST PORT 1 VIEW  Result Date: 01/12/2020 CLINICAL DATA:  Surgery follow-up EXAM: PORTABLE CHEST 1 VIEW COMPARISON:  01/12/2020 FINDINGS: Interval removal of right chest tube. No visible pneumothorax. Residual right base atelectasis. Left base atelectasis and mild cardiomegaly. IMPRESSION: Interval removal of right chest tube without visible pneumothorax. Otherwise no change. Electronically Signed   By: Rolm Baptise M.D.   On: 01/12/2020 17:00   DG CHEST PORT 1 VIEW  Result Date: 01/12/2020 CLINICAL DATA:  Right pleural effusion.  Right chest tube.  Sepsis. EXAM: PORTABLE CHEST 1 VIEW COMPARISON:  Radiographs dated  01/11/2020 and 01/10/2020 and 01/08/2020 FINDINGS: Right chest tube unchanged in position. No pneumothorax. Slightly improved aeration at the right lung base with a small focal area of stable linear atelectasis at the right base. No appreciable residual effusion. Heart size and vascularity are normal. Left lung is clear. No acute bone abnormality. IMPRESSION: 1. No pneumothorax. 2. Slightly improved aeration at the right lung base. Electronically Signed   By: Lorriane Shire M.D.   On: 01/12/2020 11:14   DG CHEST PORT 1 VIEW  Result Date: 01/11/2020 CLINICAL DATA:  Chest tube EXAM: PORTABLE CHEST 1 VIEW COMPARISON:  Chest radiograph 01/10/2020 FINDINGS: Stable positioning of a right pigtail chest tube. No pneumothorax. Persistent right greater than left bibasilar atelectasis. No large pleural effusion. Stable cardiomediastinal contours with enlarged heart size. IMPRESSION: Stable chest tube position. No pneumothorax. Electronically Signed   By: Audie Pinto M.D.   On: 01/11/2020 08:34        Scheduled Meds: . aspirin EC  81 mg Oral QHS  . carvedilol  12.5 mg Oral BID WC  . Chlorhexidine Gluconate Cloth  6 each Topical Daily  . DULoxetine  20 mg Oral Daily  . famotidine  20 mg Oral QHS  . fluticasone  2 spray Each Nare Daily  . heparin injection (subcutaneous)  5,000 Units Subcutaneous Q8H  . insulin aspart  0-15 Units Subcutaneous TID WC  . insulin aspart  0-5 Units Subcutaneous QHS  . mouth rinse  15 mL Mouth Rinse BID  . multivitamin with minerals  1 tablet Oral Daily  . polyethylene glycol  17 g Oral Daily  . predniSONE  5 mg Oral Q breakfast  . pregabalin  100 mg Oral BID  . senna-docusate  2 tablet Oral BID  . sodium chloride flush  10-40 mL Intracatheter Q12H  . sodium chloride flush  5 mL Intravenous Q8H   Continuous Infusions: . sodium chloride Stopped (12/30/19 1700)  . cefTRIAXone (ROCEPHIN)  IV 2 g (01/12/20 1224)  . magnesium sulfate bolus IVPB 2 g (01/12/20 2003)      LOS: 16 days   Time spent= 35 mins    Berle Mull, MD [facility]  If 7PM-7AM, please contact night-coverage  01/12/2020, 8:33 PM

## 2020-01-12 NOTE — Progress Notes (Signed)
IR to bedside to follow-up with patient today, however all drains have been removed.   Brynda Greathouse, MS RD [name] 1:57 PM

## 2020-01-12 NOTE — Progress Notes (Addendum)
[address]       [place] [address]             [phone]        Procedure(s) (LRB): VIDEO ASSISTED THORACOSCOPY (VATS)/DECORTICATION (Right) Subjective: Feels ok, denies SOB  Objective: Vital signs in last 24 hours: Temp:  [98 F (36.7 C)-98.7 F (37.1 C)] 98.3 F (36.8 C) (06/03 0802) Pulse Rate:  [67-72] 67 (06/03 0802) Cardiac Rhythm: Normal sinus rhythm (06/02 1930) Resp:  [13-18] 18 (06/03 0802) BP: (115-159)/(63-74) 133/70 (06/03 0802) SpO2:  [90 %-97 %] 97 % (06/03 0802)  Hemodynamic parameters for last 24 hours:    Intake/Output from previous day: 06/02 0701 - 06/03 0700 In: 270 [P.O.:240; I.V.:10] Out: 870 [Urine:750; Chest Tube:120] Intake/Output this shift: No intake/output data recorded.  General appearance: alert, cooperative, fatigued and no distress Heart: regular rate and rhythm Lungs: milldy dim in right base Abdomen: benign Extremities: no calf tenderness Wound: incis healing well  Lab Results: Recent Labs    01/11/20 0540 01/12/20 0254  WBC 11.0* 11.8*  HGB 7.4* 7.6*  HCT 24.0* 24.3*  PLT 471* 479*   BMET:  Recent Labs    01/11/20 0540 01/12/20 0254  NA 139 138  K 4.5 4.3  CL 104 103  CO2 28 27  GLUCOSE 98 95  BUN 17 15  CREATININE 1.36* 1.32*  CALCIUM 8.3* 8.1*    PT/INR: No results for input(s): LABPROT, INR in the last 72 hours. ABG    Component Value Date/Time   PHART 7.453 (H) 01/02/2020 0355   HCO3 24.6 01/02/2020 0355   TCO2 25 05/19/2018 1430   ACIDBASEDEF 3.3 (H) 12/31/2019 0908   O2SAT 97.1 01/02/2020 0355   CBG (last 3)  Recent Labs    01/11/20 1550 01/11/20 2206 01/12/20 0625  GLUCAP 190* 123* 93    Meds Scheduled Meds: . alteplase (TPA) for intrapleural administration  10 mg Intrapleural Q12H   And  . pulmozyme (DORNASE) for intrapleural administration  5 mg Intrapleural Q12H  . aspirin EC  81 mg Oral QHS  . carvedilol  12.5 mg Oral BID WC  . Chlorhexidine Gluconate  Cloth  6 each Topical Daily  . DULoxetine  20 mg Oral Daily  . famotidine  20 mg Oral QHS  . fluticasone  2 spray Each Nare Daily  . heparin injection (subcutaneous)  5,000 Units Subcutaneous Q8H  . insulin aspart  0-15 Units Subcutaneous TID WC  . insulin aspart  0-5 Units Subcutaneous QHS  . mouth rinse  15 mL Mouth Rinse BID  . multivitamin with minerals  1 tablet Oral Daily  . polyethylene glycol  17 g Oral Daily  . predniSONE  5 mg Oral Q breakfast  . pregabalin  100 mg Oral BID  . senna-docusate  2 tablet Oral BID  . sodium chloride flush  10-40 mL Intracatheter Q12H  . sodium chloride flush  5 mL Intravenous Q8H   Continuous Infusions: . sodium chloride Stopped (12/30/19 1700)  . cefTRIAXone (ROCEPHIN)  IV 2 g (01/11/20 1241)   PRN Meds:.sodium chloride, acetaminophen, albuterol, fentaNYL (SUBLIMAZE) injection, hydrALAZINE, oxyCODONE-acetaminophen, polyethylene glycol, sodium chloride flush  Xrays DG CHEST PORT 1 VIEW  Result Date: 01/11/2020 CLINICAL DATA:  Chest tube EXAM: PORTABLE CHEST 1 VIEW COMPARISON:  Chest radiograph 01/10/2020 FINDINGS: Stable positioning of a right pigtail chest tube. No pneumothorax. Persistent right greater than left bibasilar atelectasis. No large pleural effusion. Stable cardiomediastinal contours with enlarged heart  size. IMPRESSION: Stable chest tube position. No pneumothorax. Electronically Signed   By: Audie Pinto M.D.   On: 01/11/2020 08:34   DG Chest Port 1 View  Result Date: 01/10/2020 CLINICAL DATA:  64 year old male with right side chest tube for treatment of a fusion. EXAM: PORTABLE CHEST 1 VIEW COMPARISON:  Portable chest 01/08/2020 and earlier. FINDINGS: Portable AP semi upright view at 1044 hours. Stable pigtail right chest tube. No pneumothorax identified. Ventilation has further improved at the right lung base, with streaky residual opacity. Stable cardiac size and mediastinal contours. Stable mild left lung base atelectasis.  Visualized tracheal air column is within normal limits. No acute osseous abnormality identified. IMPRESSION: 1. Stable right chest tube with further improved right lung base ventilation and no pneumothorax identified. 2. No new cardiopulmonary abnormality. Electronically Signed   By: Genevie Ann M.D.   On: 01/10/2020 10:58    Assessment/Plan: S/P Procedure(s) (LRB): VIDEO ASSISTED THORACOSCOPY (VATS)/DECORTICATION (Right)  1 very stable from CT surgery perspective, 120 cc drainage- will prob remove tube today, will d/w MD   LOS: 16 days    John Giovanni [name] Pager [phone] 01/12/2020   Agree with above CT out Please call with questions  Lajuana Matte

## 2020-01-12 NOTE — Progress Notes (Signed)
Physical Therapy Treatment Patient Details Name: Marco [name] MRN: [id number] DOB: Sep 04, 1955 Today's Date: 01/12/2020    History of Present Illness Patient is a 64 y/o male who presents with E-Coli right pyelonephritis.  Initially admitted with concern for sepsis with fever, leukocytosis and elevated lactic acid. Work up concerning for possible infected right renal cyst with associated right renal pyelonephritis.  He had progressive agitated delirium, fevers despite antibiotics.  IR drained renal cyst 5/23 with bloody fluid returned.  Ultimately, required intubation on 5/23 for agitated delirium / impending respiratory failure.  Extubated 5/25. s/p CT placement 5/29 with plan for thrombolytics.    PT Comments    Pt able to complete bed mobility min guard and sit to stand mod(A)+2 with increases assistance for power up. Pt able to amb 155ft RW min guard for safety and 1-2L for decreases in spo2 with exertion. Discussed d/c recommendations with wife and pt, both agreeable to home with home health upon d/c. Will continue to follow acutely until d/c.   BP supine-135/110 BP standing 141/74    Follow Up Recommendations  Home health PT;Supervision for mobility/OOB     Equipment Recommendations  Rolling walker with 5" wheels    Recommendations for Other Services       Precautions / Restrictions Precautions Precautions: Fall Precaution Comments: watch o2 with amb Restrictions Weight Bearing Restrictions: No    Mobility  Bed Mobility Overal bed mobility: Needs Assistance Bed Mobility: Supine to Sit     Supine to sit: HOB elevated;Min guard     General bed mobility comments: Pt required min guard for sitting EOB, with assistance for lines.  Transfers Overall transfer level: Needs assistance Equipment used: Rolling walker (2 wheeled) Transfers: Sit to/from Stand Sit to Stand: Mod assist;+2 physical assistance         General transfer comment: Mod(A)+2 for power up with sit  to stand, pt min(A) about halfway through movement  Ambulation/Gait Ambulation/Gait assistance: Min guard;+2 safety/equipment Gait Distance (Feet): 120 Feet Assistive device: Rolling walker (2 wheeled) Gait Pattern/deviations: Step-through pattern;Decreased stride length;Trunk flexed Gait velocity: decreased   General Gait Details: slow amb, required RW for safety and stability. Pt spo2 88% RA with amb, increased to 92-93% 1L by [name], and increased >93% 2L [name] with amb.   Stairs             Wheelchair Mobility    Modified Rankin (Stroke Patients Only)       Balance Overall balance assessment: Needs assistance Sitting-balance support: Feet supported;No upper extremity supported Sitting balance-Leahy Scale: Good     Standing balance support: Bilateral upper extremity supported;During functional activity Standing balance-Leahy Scale: Poor Standing balance comment: requires B UE support of walker and Min guard.                            Cognition Arousal/Alertness: Awake/alert Behavior During Therapy: Flat affect Overall Cognitive Status: Within Functional Limits for tasks assessed                                 General Comments: Pt reports he is fatigued with mobility. Pt able to follow multistep directions with minimal verbal cues. Wife present and engaged in session      Exercises      General Comments General comments (skin integrity, edema, etc.): Pt wife present and engaged in session. Pt and wife both feel comfortable  with pt at home.      Pertinent Vitals/Pain Pain Assessment: Faces Faces Pain Scale: Hurts little more Pain Location: lower back on the left side, reports this pain has been going on for a while Pain Descriptors / Indicators: Sore;Grimacing;Guarding Pain Intervention(s): Monitored during session    Home Living                      Prior Function            PT Goals (current goals can now be found in  the care plan section) Acute Rehab PT Goals Patient Stated Goal: to go home with wife support    Frequency    Min 3X/week      PT Plan Current plan remains appropriate    Co-evaluation PT/OT/SLP Co-Evaluation/Treatment: Yes Reason for Co-Treatment: Other (comment)(refused, poor tolerance) PT goals addressed during session: Mobility/safety with mobility;Proper use of DME OT goals addressed during session: ADL's and self-care;Proper use of Adaptive equipment and DME;Strengthening/ROM      AM-PAC PT "6 Clicks" Mobility   Outcome Measure  Help needed turning from your back to your side while in a flat bed without using bedrails?: None Help needed moving from lying on your back to sitting on the side of a flat bed without using bedrails?: None Help needed moving to and from a bed to a chair (including a wheelchair)?: A Little Help needed standing up from a chair using your arms (e.g., wheelchair or bedside chair)?: A Little Help needed to walk in hospital room?: A Little Help needed climbing 3-5 steps with a railing? : A Lot 6 Click Score: 19    End of Session Equipment Utilized During Treatment: Oxygen;Gait belt Activity Tolerance: Patient tolerated treatment well Patient left: with call bell/phone within reach;in chair Nurse Communication: Mobility status PT Visit Diagnosis: Difficulty in walking, not elsewhere classified (R26.2);Pain     Time: [id number] PT Time Calculation (min) (ACUTE ONLY): 23 min  Charges:  $Therapeutic Exercise: 8-22 mins                     [employer] SPT 01/12/2020    Rolland Porter 01/12/2020, 5:55 PM

## 2020-01-12 NOTE — Progress Notes (Signed)
Removed chest tube and JP drain per order. Patient tolerated well. Will continue to monitor.  Paulene Floor, RN

## 2020-01-13 LAB — COMPREHENSIVE METABOLIC PANEL
ALT: 65 U/L — ABNORMAL HIGH (ref 0–44)
AST: 25 U/L (ref 15–41)
Albumin: 1.8 g/dL — ABNORMAL LOW (ref 3.5–5.0)
Alkaline Phosphatase: 72 U/L (ref 38–126)
Anion gap: 9 (ref 5–15)
BUN: 12 mg/dL (ref 8–23)
CO2: 28 mmol/L (ref 22–32)
Calcium: 8.2 mg/dL — ABNORMAL LOW (ref 8.9–10.3)
Chloride: 102 mmol/L (ref 98–111)
Creatinine, Ser: 1.42 mg/dL — ABNORMAL HIGH (ref 0.61–1.24)
GFR calc Af Amer: >60 mL/min (ref >60)
GFR calc non Af Amer: 52 mL/min — ABNORMAL LOW (ref >60)
Glucose, Bld: 103 mg/dL — ABNORMAL HIGH (ref 70–99)
Potassium: 4.1 mmol/L (ref 3.5–5.1)
Sodium: 139 mmol/L (ref 135–145)
Total Bilirubin: 0.5 mg/dL (ref 0.3–1.2)
Total Protein: 6.6 g/dL (ref 6.5–8.1)

## 2020-01-13 LAB — CBC WITH DIFFERENTIAL/PLATELET
Abs Immature Granulocytes: 0.13 10*3/uL — ABNORMAL HIGH (ref 0.00–0.07)
Basophils Absolute: 0.1 10*3/uL (ref 0.0–0.1)
Basophils Relative: 1 %
Eosinophils Absolute: 0.3 10*3/uL (ref 0.0–0.5)
Eosinophils Relative: 3 %
HCT: 25.5 % — ABNORMAL LOW (ref 39.0–52.0)
Hemoglobin: 8 g/dL — ABNORMAL LOW (ref 13.0–17.0)
Immature Granulocytes: 1 %
Lymphocytes Relative: 18 %
Lymphs Abs: 2.2 10*3/uL (ref 0.7–4.0)
MCH: 28.5 pg (ref 26.0–34.0)
MCHC: 31.4 g/dL (ref 30.0–36.0)
MCV: 90.7 fL (ref 80.0–100.0)
Monocytes Absolute: 1.5 10*3/uL — ABNORMAL HIGH (ref 0.1–1.0)
Monocytes Relative: 12 %
Neutro Abs: 8 10*3/uL — ABNORMAL HIGH (ref 1.7–7.7)
Neutrophils Relative %: 65 %
Platelets: 501 10*3/uL — ABNORMAL HIGH (ref 150–400)
RBC: 2.81 MIL/uL — ABNORMAL LOW (ref 4.22–5.81)
RDW: 17.5 % — ABNORMAL HIGH (ref 11.5–15.5)
WBC: 12.2 10*3/uL — ABNORMAL HIGH (ref 4.0–10.5)
nRBC: 0.3 % — ABNORMAL HIGH (ref 0.0–0.2)

## 2020-01-13 LAB — IRON AND TIBC
Iron: 30 ug/dL — ABNORMAL LOW (ref 45–182)
Saturation Ratios: 21 % (ref 17.9–39.5)
TIBC: 146 ug/dL — ABNORMAL LOW (ref 250–450)
UIBC: 116 ug/dL

## 2020-01-13 LAB — GLUCOSE, CAPILLARY
Glucose-Capillary: 111 mg/dL — ABNORMAL HIGH (ref 70–99)
Glucose-Capillary: 106 mg/dL — ABNORMAL HIGH (ref 70–99)
Glucose-Capillary: 166 mg/dL — ABNORMAL HIGH (ref 70–99)

## 2020-01-13 LAB — VITAMIN B12: Vitamin B-12: 331 pg/mL (ref 180–914)

## 2020-01-13 LAB — FERRITIN: Ferritin: 516 ng/mL — ABNORMAL HIGH (ref 24–336)

## 2020-01-13 MED ORDER — CYANOCOBALAMIN 1000 MCG/ML IJ SOLN
1000.0000 ug | Freq: Once | INTRAMUSCULAR | Status: AC
  Administered 2020-01-13: 1000 ug via SUBCUTANEOUS
  Filled 2020-01-13: qty 1

## 2020-01-13 MED ORDER — FERROUS SULFATE 325 (65 FE) MG PO TABS
325.0000 mg | ORAL_TABLET | Freq: Two times a day (BID) | ORAL | Status: DC
  Administered 2020-01-14: 325 mg via ORAL
  Filled 2020-01-13: qty 1

## 2020-01-13 MED ORDER — VITAMIN B-12 1000 MCG PO TABS
1000.0000 ug | ORAL_TABLET | Freq: Every day | ORAL | Status: DC
  Administered 2020-01-14: 1000 ug via ORAL
  Filled 2020-01-13: qty 1

## 2020-01-13 NOTE — TOC Progression Note (Signed)
Transition of Care [facility]) - Progression Note    Patient Details  Name: Eland Lamantia MRN: [id number] Date of Birth: [birth year]  Transition of Care [facility]) CM/SW [place], RN Phone Number: 01/13/2020, 3:13 PM  Clinical Narrative:     Will still be in hospital 2-3 more days.  According to MD. Patient states MD said he could go home tomorrow. He is now active with Medicare. [employer]. Copy made and placed in shadow chart.Call [phone] for verification.  Has asked for [facility] health PT/OT/Aide.  Has Rollator and 3:1 already received from [employer].   Expected Discharge Plan: [place] Barriers to Discharge: Continued Medical Work up  Expected Discharge Plan and Services Expected Discharge Plan: [place] In-house Referral: Clinical Social Work Discharge Planning Services: CM Consult Post Acute Care Choice: [place] arrangements for the past 2 months: Single Family Home Expected Discharge Date: (unknown)               DME Arranged: [place] rolling DME Agency: [employer] Date DME Agency Contacted: 12/28/19 Time DME Agency Contacted: [id number] Representative spoke with at DME Agency: [place] ([place]) Interventions    Readmission Risk Interventions Readmission Risk Prevention Plan 12/28/2019  Transportation Screening Complete  PCP or Specialist Appt within 3-5 Days Complete  HRI or [place] Complete  Social Work Consult for [place] Planning/Counseling [place] Not Applicable  Medication Review Press photographer) Complete  Some recent data might be hidden

## 2020-01-13 NOTE — Care Management (Signed)
Patient and wife's first choice was [facility], cannot take, tried [facility] awaiting response.

## 2020-01-13 NOTE — Progress Notes (Signed)
[facility] Progress Note  Patient: Marco [name]    JOI:[id number]  DOA: 12/27/2019     Date of Service: the patient was seen and examined on 01/13/2020  Chief Complaint  Patient presents with  . Weakness   Brief hospital course: 64 y.o.male with a history of systolic and diastolic congestive heart failure, nonischemic cardiomyopathy, chronic kidney disease stage III, diabetes mellitus type 2, coronary artery disease, hypertension, obesity, obstructive sleep apnea, atrial fibrillation status post ablation 2017, psoriatic arthritis on daily prednisone. Patient presented secondary to lethargy and weakness and found to have evidence of sepsis from recurrent UTI vs prostatitis from recent UTI. Patient developed respiratory and hemodynamic failure requiring ICU admission, intubation/mechanical ventilation and vasopressor support. While admitted, he has had recurrent/persistent fevers on antibiotics. Infectious disease is on board. Respiratory status improved and he was extubated on 5/25 successfully. CT chest concerning for right loculated fluid which is possibly secondary to infection.  Chest tube placed 5/29.  Now removed. Currently plan is overnight observation for improvement in hypoxia and resolution of fluid.  Assessment and Plan: Sepsis secondary to right-sided pyelonephritis with infected cyst Right-sided loculated effusion -Continue IV Rocephin, -CT surgery was consulted -Right-sided chest tube placed 5/29-management per IR and CT surgery.  Status post first dose of thrombolytic 5/31, good response.  Removed on 01/12/2020.  Follow-up chest x-ray on 01/14/2020. -CT-guided drain placed in the right renal cyst 5/23, also removed on 01/12/2020. -Follow fluid cultures.  Renal cyst cultures grew E. coli-MDR -Urology and ID signed off.  If spikes fever, repeat cultures -Currently all drains removed.  Chest tube removed.  Acute respiratory failure with hypoxemia Extubated 5/25.  Remained  stable.  Left maxillary sinus disease Chronic, incidental finding.  Noninfectious source per ID.  AKI on CKD stage IIIa Baseline creatinine of 1.5. Peak creatinine of 2.13 . Today 1.49  Chronic combined systolic and diastolic heart failure EF of 50-55% which is improved from last year. Grade 1 diastolic dysfunction noted.  Continue to monitor fluid status Continue ASA, Coreg  Rheumatoid arthritis Prednisone, Oxycodone.  Generalized pain -Continue Lyrica and Cymbalta. Titrate Lyrica for renal function  Anemia of chronic disease Hb ~ 8.3 In setting of kidney disease. Stable.  Hemoglobin today 7.7  Elevated LFTs; improving.  Likelysecondary to hypotension which was noted from 5/23-5/24. RUQ ultrasound obtained with fatty infiltration of the liver.Hepatitis panel negative.  Gout Patient is on allopurinol which has been held secondary to AKI  Pressure injury Left/Right buttock. POA  Poor p.o. intake. Constipation Dietary has been following.  Improving oral intake  Obesity Body mass index is 36.83 kg/m.  Nutrition Problem: Increased nutrient needs Etiology: acute illness, wound healing Interventions: Interventions: Ensure Enlive (each supplement provides 350kcal and 20 grams of protein), Prostat, Juven, MVI  Pressure Injury 12/27/19 Buttocks Right Unstageable - Full thickness tissue loss in which the base of the injury is covered by slough (yellow, tan, gray, green or brown) and/or eschar (tan, brown or black) in the wound bed. (Active)  12/27/19 1327  Location: Buttocks  Location Orientation: Right  Staging: Unstageable - Full thickness tissue loss in which the base of the injury is covered by slough (yellow, tan, gray, green or brown) and/or eschar (tan, brown or black) in the wound bed.  Wound Description (Comments):   Present on Admission: Yes     Pressure Injury 12/27/19 Buttocks Left Unstageable - Full thickness tissue loss in which the base of the  injury is covered by slough (yellow, tan, gray, green  or brown) and/or eschar (tan, brown or black) in the wound bed. (Active)  12/27/19 1328  Location: Buttocks  Location Orientation: Left  Staging: Unstageable - Full thickness tissue loss in which the base of the injury is covered by slough (yellow, tan, gray, green or brown) and/or eschar (tan, brown or black) in the wound bed.  Wound Description (Comments):   Present on Admission: Yes     Diet: Cardiac diet DVT Prophylaxis: Subcutaneous Heparin    Advance goals of care discussion: Full code  Family Communication: no family was present at bedside, at the time of interview.   Disposition:  Current level of care for the pt is Inpatient  Remains inpatient appropriate because:Ongoing diagnostic testing needed not appropriate for outpatient work up  Dispo: The patient is from: Home              Anticipated d/c is to: Home              Anticipated d/c date is: Tomorrow              Patient currently is not medically stable to d/c.  Subjective: No nausea no vomiting no fever no chills.  Continues to have shortness of breath.  Hypoxia to 85% on room air.  Physical Exam:  General: Appear in mild distress, no Rash; Oral Mucosa Clear, moist. no Abnormal Neck Mass Or lumps, Conjunctiva normal  Cardiovascular: S1 and S2 Present, no Murmur, Respiratory: good respiratory effort, Bilateral Air entry present and bilateral  Crackles, no wheezes Abdomen: Bowel Sound present, Soft and no tenderness Extremities: no Pedal edema, no calf tenderness Neurology: alert and oriented to time, place, and person affect appropriate. no new focal deficit Gait not checked due to patient safety concerns  Vitals:   01/13/20 0352 01/13/20 0736 01/13/20 1200 01/13/20 1647  BP: (!) 152/73 (!) 148/71 (!) 143/75 119/61  Pulse: 68 67 74 74  Resp: 14 16 16 16   Temp: 99 F (37.2 C) 99 F (37.2 C) 98.6 F (37 C) 98.6 F (37 C)  TempSrc: Oral Oral Oral Oral    SpO2: 93% 91% 93% 90%  Weight:      Height:        Intake/Output Summary (Last 24 hours) at 01/13/2020 1853 Last data filed at 01/13/2020 0800 Gross per 24 hour  Intake 10 ml  Output 525 ml  Net -515 ml   Filed Weights   01/09/20 0419 01/10/20 0433 01/11/20 0600  Weight: 107.8 kg 105.8 kg 103.5 kg    Data Reviewed: I have personally reviewed and interpreted daily labs, tele strips, imagings as discussed above. I reviewed all nursing notes, pharmacy notes, vitals, pertinent old records I have discussed plan of care as described above with RN and patient/family.  CBC: Recent Labs  Lab 01/09/20 0256 01/10/20 0357 01/11/20 0540 01/12/20 0254 01/13/20 0855  WBC 14.6* 11.7* 11.0* 11.8* 12.2*  NEUTROABS  --   --   --   --  8.0*  HGB 7.7* 7.5* 7.4* 7.6* 8.0*  HCT 24.3* 24.5* 24.0* 24.3* 25.5*  MCV 89.7 91.1 90.6 90.3 90.7  PLT PLATELET CLUMPS NOTED ON SMEAR, UNABLE TO ESTIMATE 431* 471* 479* 099*   Basic Metabolic Panel: Recent Labs  Lab 01/08/20 0250 01/08/20 0250 01/09/20 0256 01/10/20 0357 01/11/20 0540 01/12/20 0254 01/13/20 0855  NA 143   < > 143 141 139 138 139  K 4.4   < > 4.5 4.5 4.5 4.3 4.1  CL 108   < >  106 104 104 103 102  CO2 27   < > 25 27 28 27 28   GLUCOSE 142*   < > 143* 133* 98 95 103*  BUN 24*   < > 23 18 17 15 12   CREATININE 1.61*   < > 1.52* 1.49* 1.36* 1.32* 1.42*  CALCIUM 8.3*   < > 8.3* 8.3* 8.3* 8.1* 8.2*  MG 1.9  --  1.9 1.7 1.8 1.7  --    < > = values in this interval not displayed.    Studies: No results found.  Scheduled Meds: . aspirin EC  81 mg Oral QHS  . carvedilol  12.5 mg Oral BID WC  . Chlorhexidine Gluconate Cloth  6 each Topical Daily  . DULoxetine  20 mg Oral Daily  . famotidine  20 mg Oral QHS  . fluticasone  2 spray Each Nare Daily  . heparin injection (subcutaneous)  5,000 Units Subcutaneous Q8H  . insulin aspart  0-15 Units Subcutaneous TID WC  . insulin aspart  0-5 Units Subcutaneous QHS  . mouth rinse  15 mL Mouth  Rinse BID  . multivitamin with minerals  1 tablet Oral Daily  . polyethylene glycol  17 g Oral Daily  . predniSONE  5 mg Oral Q breakfast  . pregabalin  100 mg Oral BID  . senna-docusate  2 tablet Oral BID  . sodium chloride flush  10-40 mL Intracatheter Q12H  . sodium chloride flush  5 mL Intravenous Q8H   Continuous Infusions: . sodium chloride Stopped (12/30/19 1700)  . cefTRIAXone (ROCEPHIN)  IV 2 g (01/13/20 1217)   PRN Meds: sodium chloride, acetaminophen, albuterol, fentaNYL (SUBLIMAZE) injection, hydrALAZINE, oxyCODONE-acetaminophen, polyethylene glycol, sodium chloride flush  Time spent: 35 minutes  Author: Berle Mull, MD [facility] 01/13/2020 6:53 PM  To reach On-call, see care teams to locate the attending and reach out via [email or web address]. Between 7PM-7AM, please contact night-coverage If you still have difficulty reaching the attending provider, please page the [facility] (Director on Call) for [facility] on amion for assistance.

## 2020-01-14 ENCOUNTER — Inpatient Hospital Stay (HOSPITAL_COMMUNITY): Payer: Medicaid Other

## 2020-01-14 LAB — GLUCOSE, CAPILLARY
Glucose-Capillary: 141 mg/dL — ABNORMAL HIGH (ref 70–99)
Glucose-Capillary: 87 mg/dL (ref 70–99)

## 2020-01-14 MED ORDER — CYANOCOBALAMIN 1000 MCG PO TABS: 1000.0000 ug | ORAL_TABLET | Freq: Every day | ORAL | 0 refills | Status: DC

## 2020-01-14 MED ORDER — FERROUS SULFATE 325 (65 FE) MG PO TABS: 325.0000 mg | ORAL_TABLET | Freq: Two times a day (BID) | ORAL | 0 refills | Status: DC

## 2020-01-14 MED ORDER — COSENTYX 150 MG/ML ~~LOC~~ SOSY: 150.0000 mg | PREFILLED_SYRINGE | SUBCUTANEOUS | Status: DC

## 2020-01-14 MED ORDER — FUROSEMIDE 20 MG PO TABS: 20.0000 mg | ORAL_TABLET | Freq: Every day | ORAL | 0 refills | Status: DC

## 2020-01-14 MED ORDER — POTASSIUM CHLORIDE CRYS ER 10 MEQ PO TBCR: 10.0000 meq | EXTENDED_RELEASE_TABLET | Freq: Every day | ORAL | 0 refills | Status: DC

## 2020-01-14 MED ORDER — POLYETHYLENE GLYCOL 3350 17 G PO PACK: 17.0000 g | PACK | Freq: Every day | ORAL | 0 refills | Status: DC | PRN

## 2020-01-14 MED ORDER — TRAMADOL HCL 50 MG PO TABS: 50.0000 mg | ORAL_TABLET | Freq: Four times a day (QID) | ORAL | 0 refills | Status: DC | PRN

## 2020-01-14 NOTE — TOC Transition Note (Signed)
Transition of Care [facility]) - CM/SW Discharge Note   Patient Details  Name: Marco [name] MRN: [id number] Date of Birth: [birth year]  Transition of [facility]) CM/SW Contact:  Claudie Leach, RN 01/14/2020, 11:20 AM   Clinical Narrative:    Patient to d/c home with [facility] services and DME O2.  Patient states his wife will drive him home.    [place] declined patient due to PT staffing.  Alvis Lemmings [facility] accepted patient for Pt/OT with [facility] Wednesday 6/9.  Discussed need for RN with patient. He states he does not need RN.   Patient will be educated by bedside RN on foam dressing and some supplies will be given for home use.    Oxygen will be delivered by [employer] to room and to home later today.     Final next level of care: [place] Barriers to Discharge: Continued Medical Work up   Patient Goals and CMS Choice Patient states their goals for this hospitalization and ongoing recovery are:: go home CMS Medicare.gov Compare Post Acute Care list provided to:: Patient Choice offered to / list presented to : Patient   Discharge Plan and Services In-house Referral: Clinical Social Work Discharge Planning Services: CM Consult Post Acute Care Choice: Home Health          DME Arranged: Oxygen DME Agency: [place]) Date DME Agency Contacted: 01/14/20 Time DME Agency Contacted: 1120 Representative spoke with at DME Agency: [place] HH Arranged: PT, OT [place] Agency: [place] Date [place]: 01/14/20 Time [place]: 1120 Representative spoke with at [place]: [place]   Readmission Risk Interventions Readmission Risk Prevention Plan 12/28/2019  Transportation Screening Complete  PCP or Specialist Appt within 3-5 Days Complete  HRI or [place] Complete  Social Work Consult for [place] Planning/Counseling [place] Not Applicable  Medication Review Press photographer) Complete  Some recent data might be  hidden

## 2020-01-14 NOTE — Progress Notes (Signed)
Pt. Given d/c summary, he verbalized understanding to d/c instructions, Tele, and IV site removed.  Pt. In no distress at this time.  Pt transported to main entrance via w/c by NT.

## 2020-01-14 NOTE — Progress Notes (Signed)
SATURATION QUALIFICATIONS: (This note is used to comply with regulatory documentation for home oxygen)  Patient Saturations on Room Air at Rest = 94%  Patient Saturations on Room Air while Ambulating = 87%  Patient Saturations on 2 Liters of oxygen while Ambulating = 92%  Please briefly explain why patient needs home oxygen:            Oxygen level drops on exsertion with walker and walking slow pace.

## 2020-01-16 ENCOUNTER — Telehealth: Payer: Self-pay

## 2020-01-16 NOTE — Discharge Summary (Signed)
[facility] Discharge Summary   Patient: Marco [name] VQM:[id number]  PCP: [name], MD  Date of admission: 12/27/2019   Date of discharge: 01/14/2020     Discharge Diagnoses:  Principal diagnosis Sepsis secondary to right-sided pyelonephritis with infected cyst Right-sided loculated effusion  Active Problems:   CAD (coronary artery disease)   Atrial fibrillation ([place])   Essential hypertension   Chronic kidney disease   Morbid obesity due to excess calories ([place])   Chronic pain   Type 2 diabetes mellitus with stage 3 chronic kidney disease, without long-term current use of insulin (HCC)   OSA (obstructive sleep apnea)   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[place])   Diabetic polyneuropathy associated with type 2 diabetes mellitus ([place])   Acute respiratory failure with hypoxia (HCC)   Chronic combined systolic and diastolic CHF (congestive heart failure) (HCC)   Hyperlipidemia   Urethral stricture   Nephrolithiasis   Complex renal cyst   Elevated LFTs   Pleural effusion   Weakness   Loculated pleural effusion   Admitted From: Home Disposition:  Home with home health  Recommendations for Outpatient Follow-up:  1. PCP: Follow-up with PCP in 1 week 2. Follow up LABS/TEST: None  Follow-up Information    [facility] Follow up.   Specialty: Rehabilitation Why: they will call you for appt. Contact information: [address] [phone] mc [place] [facility] [place], [facility] Follow up.   Specialty: Home Health Services Contact information: [address] [place] [address] [phone]          Diet recommendation: Cardiac diet  Activity: The patient is advised to gradually reintroduce usual activities, as tolerated  Discharge Condition: stable  Code Status: Full code   History of present illness: As per the H and P dictated on admission, "Marco [name] is a  64 y.o. male with medical history significant of combined congestive heart failure, nonischemic cardiomyopathy, CKD stage III, diabetes type 2, coronary artery disease, hypertension, obesity, OSA A. fib status post ablation in 2017, psoriatic arthritis on daily prednisone therapy who presents to the emergency department complaints of weakness, lethargic, falls at home.  He was recently admitted and was discharged earlier this month after being managed for  pyelonephritis.  Patient was noticing increasing weakness after discharge to home.  He also fell multiple times at home.  He also reported continuous urinary frequency, dysuria and incontinence.  Also reported diffuse joint pain from his psoriatic arthritis.  There was no report of fever. He lives with his wife.  On his normal days, he is physically active, ambulates without any problem. Covid screen test came out to be negative. Patient seen and examined at the bedside in the emergency department.  He was hemodynamically stable during my evaluation.  He is a poor historian and remains very sleepy but states that he feels feeling better and wants some rest.  He denies any chest pain, shortness of breath, abdominal pain or diarrhea."  Hospital Course:  Summary of his active problems in the hospital is as following. Sepsis secondary to right-sided pyelonephritis with infected cyst Right-sided loculated effusion Treated with IV Rocephin. CT surgery was consulted Right-sided chest tube placed 5/29-management per IR and CT surgery. Status post first dose of thrombolytic 5/31, good response.  Removed on 01/12/2020.  Follow-up chest x-ray on 01/14/2020 shows no significant worsening.  Outpatient follow-up with PCP for now.  CT-guided drain placed in the right renal  cyst 5/23, also removed on 01/12/2020. Follow fluid cultures. Renal cyst cultures grew E. coli-MDR Urology and ID signed off.  Currently all drains removed. Chest tube removed.  Acute  respiratory failure with hypoxemia Extubated 5/25. Remained stable. Home with oxygen.  Left maxillary sinus disease Chronic, incidental finding. Noninfectious source per ID.  AKI on CKD stage IIIa Baseline creatinine of 1.5. Peak creatinine of 2.13 . Today 1.49  Chronic combined systolic and diastolic heart failure EF of 50-55% which is improved from last year. Grade 1 diastolic dysfunction noted.  Continue to monitor fluid status Continue ASA, Coreg Currently holding Entresto.  Blood pressure soft therefore not resuming.   Outpatient follow-up with PCP and cardiology to resume it.  Rheumatoid arthritis Prednisone, Oxycodone.  Generalized pain Continue Lyrica and Cymbalta. Titrate Lyrica for renal function  Anemia of chronic disease In setting of kidney disease. Stable. Hemoglobin stable  Elevated LFTs; improving.  Likelysecondary to hypotension which was noted from 5/23-5/24. RUQ ultrasound obtained with fatty infiltration of the liver.Hepatitis panel negative.  Gout Patient is on allopurinol which has been held secondary to AKI, will resume.  Pressure injury Left/Right buttock. POA  Poor p.o. intake. Constipation Dietary has been following.Improving oral intake  Obesity Body mass index is 36.83 kg/m.  Nutrition Problem: Increased nutrient needs Etiology: acute illness, wound healing Nutrition Interventions: Interventions: Ensure Enlive (each supplement provides 350kcal and 20 grams of protein), Prostat, Juven, MVI  Pressure Injury 12/27/19 Buttocks Right Unstageable - Full thickness tissue loss in which the base of the injury is covered by slough (yellow, tan, gray, green or brown) and/or eschar (tan, brown or black) in the wound bed. (Active)  12/27/19 1327  Location: Buttocks  Location Orientation: Right  Staging: Unstageable - Full thickness tissue loss in which the base of the injury is covered by slough (yellow, tan, gray, green or brown)  and/or eschar (tan, brown or black) in the wound bed.  Wound Description (Comments):   Present on Admission: Yes     Pressure Injury 12/27/19 Buttocks Left Unstageable - Full thickness tissue loss in which the base of the injury is covered by slough (yellow, tan, gray, green or brown) and/or eschar (tan, brown or black) in the wound bed. (Active)  12/27/19 1328  Location: Buttocks  Location Orientation: Left  Staging: Unstageable - Full thickness tissue loss in which the base of the injury is covered by slough (yellow, tan, gray, green or brown) and/or eschar (tan, brown or black) in the wound bed.  Wound Description (Comments):   Present on Admission: Yes    Patient was seen by physical therapy, who recommended Home health, which was arranged. On the day of the discharge the patient's vitals were stable, and no other acute medical condition were reported by patient. the patient was felt safe to be discharge at Home with Home health.  Consultants: Cardiothoracic surgery, critical care surgery, IR, ID, urology Procedures: Chest tube placement by IR 01/07/2020 EEG 01/02/2020 Intubation extubation CT-guided right renal drain placement 01/01/2020.  Discharge Exam: General: Appear in no distress, no Rash; Oral Mucosa Clear, moist. Cardiovascular: S1 and S2 Present, no Murmur, Respiratory: normal respiratory effort, Bilateral Air entry present and right basal Crackles, no wheezes Abdomen: Bowel Sound present, Soft and no tenderness, no hernia Extremities: no Pedal edema, no calf tenderness Neurology: alert and oriented to time, place, and person affect appropriate.  Filed Weights   01/09/20 0419 01/10/20 0433 01/11/20 0600  Weight: 107.8 kg 105.8 kg 103.5 kg  Vitals:   01/14/20 0811 01/14/20 0812  BP: 112/83 112/83  Pulse: 66   Resp: 16   Temp: 98 F (36.7 C)   SpO2: 94%     DISCHARGE MEDICATION: Allergies as of 01/14/2020      Reactions   Nsaids Other (See Comments)   Stomach  pains. Ulcers - stated by patient       Medication List    STOP taking these medications   Entresto 49-51 MG Generic drug: sacubitril-valsartan   oxyCODONE 5 MG immediate release tablet Commonly known as: Oxy IR/ROXICODONE     TAKE these medications   Accu-Chek FastClix Lancets Misc Use one strip to monitor glucose levels BID; E11.42   albuterol 108 (90 Base) MCG/ACT inhaler Commonly known as: VENTOLIN HFA Inhale 2 puffs into the lungs every 6 (six) hours as needed for wheezing or shortness of breath.   allopurinol 100 MG tablet Commonly known as: ZYLOPRIM Take 200 mg by mouth every evening.   atorvastatin 40 MG tablet Commonly known as: LIPITOR Take 1 tablet (40 mg total) by mouth daily.   carvedilol 12.5 MG tablet Commonly known as: COREG Take 1 tablet (12.5 mg total) by mouth 2 (two) times daily with a meal.   Cosentyx 150 MG/ML Sosy Generic drug: Secukinumab Inject 150 mg into the skin every 28 (twenty-eight) days. Hold until told to resume by PCP What changed: additional instructions   cyanocobalamin 1000 MCG tablet Take 1 tablet (1,000 mcg total) by mouth daily.   diclofenac Sodium 1 % Gel Commonly known as: Voltaren Apply 4 g topically 4 (four) times daily.   DULoxetine 20 MG capsule Commonly known as: CYMBALTA TAKE 1 CAPSULE BY MOUTH ONCE DAILY   EQ Aspirin Adult Low Dose 81 MG EC tablet Generic drug: aspirin TAKE 1 TABLET BY MOUTH ONCE DAILY. SWALLOW WHOLE What changed: See the new instructions.   famotidine 20 MG tablet Commonly known as: PEPCID TAKE 1 TABLET BY MOUTH TWO TIMES DAILY   ferrous sulfate 325 (65 FE) MG tablet Take 1 tablet (325 mg total) by mouth 2 (two) times daily with a meal.   fluticasone 50 MCG/ACT nasal spray Commonly known as: FLONASE Place 2 sprays into both nostrils daily.   furosemide 20 MG tablet Commonly known as: LASIX Take 1 tablet (20 mg total) by mouth daily. Can take extra as needed What changed:    medication strength  how much to take   glucose blood test strip Commonly known as: Accu-Chek Guide Use one strip to monitor glucose levels BID; E11.42   Insulin Lispro Prot & Lispro (75-25) 100 UNIT/ML Kwikpen Commonly known as: HumaLOG Mix 75/25 KwikPen Inject 100 Units into the skin daily with breakfast.   Pen Needles 31G X 6 MM Misc 1 each by Does not apply route daily. E11.9   polyethylene glycol 17 g packet Commonly known as: MIRALAX / GLYCOLAX Take 17 g by mouth daily as needed for moderate constipation or severe constipation.   potassium chloride 10 MEQ tablet Commonly known as: KLOR-CON Take 1 tablet (10 mEq total) by mouth daily for 7 days. What changed:   medication strength  how much to take   predniSONE 5 MG tablet Commonly known as: DELTASONE Take 5 mg by mouth daily.   pregabalin 100 MG capsule Commonly known as: LYRICA Take 100 mg by mouth 3 (three) times daily.   traMADol 50 MG tablet Commonly known as: Ultram Take 1 tablet (50 mg total) by mouth every 6 (six) hours  as needed for moderate pain or severe pain.   Vyndamax 61 MG Caps Generic drug: Tafamidis Take 61 mg by mouth daily.   zolpidem 6.25 MG CR tablet Commonly known as: AMBIEN CR TAKE 1 TABLET BY MOUTH EVERY DAY AT BEDTIME AS NEEDED FOR SLEEP What changed: See the new instructions.            Discharge Care Instructions  (From admission, onward)         Start     Ordered   01/14/20 0000  Discharge wound care:    Comments: Foam dressing daily.   01/14/20 0912         Allergies  Allergen Reactions  . Nsaids Other (See Comments)    Stomach pains. Ulcers - stated by patient    Discharge Instructions    Diet - low sodium heart healthy   Complete by: As directed    Discharge wound care:   Complete by: As directed    Foam dressing daily.   Increase activity slowly   Complete by: As directed       The results of significant diagnostics from this hospitalization  (including imaging, microbiology, ancillary and laboratory) are listed below for reference.    Significant Diagnostic Studies: CT ABDOMEN PELVIS WO CONTRAST  Result Date: 12/28/2019 CLINICAL DATA:  Abdominal pain. EXAM: CT ABDOMEN AND PELVIS WITHOUT CONTRAST TECHNIQUE: Multidetector CT imaging of the abdomen and pelvis was performed following the standard protocol without IV contrast. COMPARISON:  Dec 15, 2019 FINDINGS: Lower chest: Mild atelectasis is seen within the posterior aspect of the bilateral lung bases. There are small bilateral pleural effusions. Hepatobiliary: Stable 8 mm focus of parenchymal low attenuation is seen within the inferior medial aspect of the right lobe of the liver. No gallstones, gallbladder wall thickening, or biliary dilatation. Pancreas: Unremarkable. No pancreatic ductal dilatation or surrounding inflammatory changes. Spleen: Normal in size without focal abnormality. Adrenals/Urinary Tract: Adrenal glands are unremarkable. The kidneys are normal in size. A stable 3.9 cm x 2.9 cm exophytic isodense area is seen along the posterior aspect of the upper pole of the right kidney. A moderate to marked amount of surrounding inflammatory fat stranding is seen which is increased in severity when compared to the prior study. A stable 2.2 cm exophytic cyst is seen along the anterior aspect of the mid left kidney. Bilateral 2 mm and 3 mm nonobstructing renal stones are seen. Bladder is unremarkable. Stomach/Bowel: Stomach is within normal limits. The appendix is not identified. Surgically anastomosed bowel is seen along the proximal portion of the transverse colon. No evidence of bowel dilatation. Noninflamed diverticula are seen within the descending and sigmoid colon. Vascular/Lymphatic: Moderate severity aortic calcification. No enlarged abdominal or pelvic lymph nodes. Reproductive: Prostate is unremarkable. Other: There is a 4.6 cm x 4.1 cm fat containing right inguinal hernia. A 3.6 cm  x 2.7 cm fat containing left inguinal hernia is also seen. Musculoskeletal: No acute or significant osseous findings. IMPRESSION: 1. Small bilateral pleural effusions with mild bibasilar atelectasis. 2. Stable 3.9 cm x 2.9 cm exophytic isodense area along the posterior aspect of the upper pole of the right kidney with moderate to marked amount of surrounding inflammatory fat stranding. This likely represents a hemorrhagic cyst, with associated right-sided pyelonephritis. 3. Stable left renal cyst. 4. Bilateral nonobstructing renal stones. 5. Colonic diverticulosis. 6. Bilateral fat-containing inguinal hernias. Aortic Atherosclerosis (ICD10-I70.0). Electronically Signed   By: Virgina Norfolk M.D.   On: 12/28/2019 16:50   DG Chest  1 View  Result Date: 01/05/2020 CLINICAL DATA:  Status post right thoracentesis. EXAM: CHEST  1 VIEW COMPARISON:  Jan 04, 2020. FINDINGS: Stable cardiomediastinal silhouette. No pneumothorax is noted. Left lung is clear. Mild to moderate right pleural effusion is noted with associated right basilar atelectasis or infiltrate. Bony thorax is unremarkable. IMPRESSION: Mild to moderate right pleural effusion is noted with associated right basilar atelectasis or infiltrate. No pneumothorax is noted. Electronically Signed   By: Marijo Conception M.D.   On: 01/05/2020 16:12   DG Chest 1 View  Result Date: 12/29/2019 CLINICAL DATA:  Shortness of breath EXAM: CHEST  1 VIEW COMPARISON:  Dec 27, 2019 FINDINGS: There is mild atelectatic change in the right lower lung region. The lungs elsewhere are clear. There is cardiomegaly with pulmonary vascularity normal. No adenopathy. No bone lesions. IMPRESSION: Mild right lower lung region atelectasis. Lungs elsewhere clear. Stable cardiomegaly. Electronically Signed   By: Lowella Grip III M.D.   On: 12/29/2019 13:42   DG Abd 1 View  Result Date: 01/01/2020 CLINICAL DATA:  Orogastric tube placement. EXAM: ABDOMEN - 1 VIEW COMPARISON:  Dec 10, 2019 FINDINGS: Enteric catheter follows the expected location of the gastric body terminating in the area of the antrum. Nonobstructive bowel gas pattern. Pigtail catheter overlies the right upper quadrant of the abdomen. Left renal calculus is again noted. IMPRESSION: Enteric catheter follows the expected location of the gastric body terminating in the area of the antrum. Electronically Signed   By: Fidela Salisbury M.D.   On: 01/01/2020 19:05   CT Head Wo Contrast  Result Date: 12/27/2019 CLINICAL DATA:  Altered mental status. EXAM: CT HEAD WITHOUT CONTRAST TECHNIQUE: Contiguous axial images were obtained from the base of the skull through the vertex without intravenous contrast. COMPARISON:  Head CT 12/09/2019 FINDINGS: Brain: The ventricles are normal in size and configuration. No extra-axial fluid collections are identified. The gray-white differentiation is maintained. No CT findings for acute hemispheric infarction or intracranial hemorrhage. No mass lesions. The brainstem and cerebellum are normal. Vascular: Vascular calcifications, advanced for age but no aneurysm or hyperdense vessels. Skull: No acute skull fracture. No bone lesion. Sinuses/Orbits: Stable severe chronic left maxillary and left half sphenoid sinus disease with partially calcified inspissated debris also extending into the left nasal cavity. Sinus wall thickening noted. The other paranasal sinuses and mastoid air cells are clear. The globes are intact. Other: No scalp lesions, laceration or hematoma. IMPRESSION: 1. No acute intracranial findings or mass lesions. 2. Stable severe chronic left maxillary and left half sphenoid sinus disease. Electronically Signed   By: Marijo Sanes M.D.   On: 12/27/2019 07:05   CT CHEST WO CONTRAST  Result Date: 01/04/2020 CLINICAL DATA:  Pleural effusion EXAM: CT CHEST WITHOUT CONTRAST TECHNIQUE: Multidetector CT imaging of the chest was performed following the standard protocol without IV  contrast. COMPARISON:  Chest radiograph Dec 25, 2019; chest CT May 16, 2020. FINDINGS: Cardiovascular: There is no evident thoracic aortic aneurysm. There are scattered foci of calcification in visualized great vessels. There are foci of aortic atherosclerosis. There also multiple foci of coronary artery calcification. There is a fairly small pericardial effusion. The pericardium does not appear thickened. Mediastinum/Nodes: Thyroid appears unremarkable. There are multiple subcentimeter mediastinal lymph nodes. There is no frank adenopathy in the thoracic region by size criteria no esophageal lesions are appreciable. Lungs/Pleura: There is a moderate pleural effusion on the right which has both free-flowing and loculated components. There is consolidation throughout  much of the right lower lobe. There is atelectatic change in the left lower lobe as well as mild consolidation in the medial segment of the left lower lobe. Minimal left pleural effusion evident. Upper Abdomen: Visualized upper abdominal structures appear unremarkable. Musculoskeletal: There is multilevel degenerative change in the thoracic spine. There are no appreciable blastic or lytic bone lesions. No chest wall lesions are evident. IMPRESSION: 1. Moderate pleural effusion on the right with both free-flowing and loculated components. Consolidation is noted throughout most of the right lower lobe, likely a combination of compressive atelectasis and pneumonia. 2. Focal infiltrate with atelectasis medial segment left lower lobe. Atelectasis more posteriorly in the left lower lobe with minimal left pleural effusion. 3.  Fairly small pericardial effusion noted. 4. Aortic atherosclerosis and foci of coronary artery calcification noted. 5. Multiple subcentimeter mediastinal lymph nodes without frank adenopathy by size criteria. Aortic Atherosclerosis (ICD10-I70.0). Electronically Signed   By: Lowella Grip III M.D.   On: 01/04/2020 12:20   MR BRAIN  WO CONTRAST  Result Date: 12/30/2019 CLINICAL DATA:  Encephalopathy.  Sepsis. EXAM: MRI HEAD WITHOUT CONTRAST TECHNIQUE: Multiplanar, multiecho pulse sequences of the brain and surrounding structures were obtained without intravenous contrast. COMPARISON:  Head CT 12/27/2019 FINDINGS: Brain: Diffusion imaging does not show any acute or subacute infarction or other cause of restricted diffusion. No focal abnormality affects the brainstem or cerebellum. Cerebral hemispheres show minimal small vessel change of the white matter. No cortical or large vessel territory infarction. No mass lesion, hemorrhage, hydrocephalus or extra-axial collection. Vascular: Major vessels at the base of the brain show flow. Skull and upper cervical spine: Negative Sinuses/Orbits: Opacification of the left maxillary sinus, the sphenoid sinus and some of the left ethmoid sinuses. Internal material is hypointense on T2 imaging. This could be chronic inspissated mucus or chronic fungal sinusitis. Other: None IMPRESSION: No significant intracranial finding. No acute infarction or other acute process. Sinus disease primarily affecting the left maxillary and sphenoid sinus. The sinuses could contain chronic inspissated mucus or could be affected by fungal sinusitis. Electronically Signed   By: Nelson Chimes M.D.   On: 12/30/2019 15:34   MR PELVIS W WO CONTRAST  Result Date: 01/03/2020 CLINICAL DATA:  Fever and sepsis. EXAM: MRI PELVIS WITHOUT AND WITH CONTRAST TECHNIQUE: Multiplanar multisequence MR imaging of the pelvis was performed both before and after administration of intravenous contrast. CONTRAST:  32mL GADAVIST GADOBUTROL 1 MMOL/ML IV SOLN COMPARISON:  12/28/2019 FINDINGS: Urinary Tract:  The urinary bladder appears normal. Bowel: No bowel wall thickening, inflammation or 4 distension. Sigmoid diverticula identified without evidence of acute inflammation. Vascular/Lymphatic: No pathologically enlarged lymph nodes. No significant  vascular abnormality seen. Reproductive:  Prostate gland is unremarkable. Other: There is a trace amount of free fluid within the posterior pelvis, image 17/5. No fluid collections identified. Bilateral fat containing inguinal hernias are identified. Musculoskeletal: No suspicious bone lesions identified. IMPRESSION: 1. No acute findings within the pelvis. 2. Trace amount of free fluid within the posterior pelvis. 3. Bilateral fat containing inguinal hernias. Electronically Signed   By: Kerby Moors M.D.   On: 01/03/2020 13:20   US PELVIS (TRANSABDOMINAL ONLY)  Result Date: 12/27/2019 CLINICAL DATA:  Prostatitis EXAM: ULTRASOUND OF THE MALE PELVIS COMPARISON:  None. FINDINGS: Bladder: Normal. Prevoid bladder volume 97 cc. Postvoid bladder volume not obtained as patient was sleeping during the exam. Prostate gland:  2.6 x 2.7 x 3.3 cm with a volume of 11.8 cc. Seminal vesicles:  Normal IMPRESSION: 1. Normal  exam. Electronically Signed   By: Kerby Moors M.D.   On: 12/27/2019 09:47   US SCROTUM  Result Date: 01/02/2020 CLINICAL DATA:  Scrotal anomaly bilateral inguinal hernia EXAM: ULTRASOUND OF SCROTUM TECHNIQUE: Complete ultrasound examination of the testicles, epididymis, and other scrotal structures was performed. COMPARISON:  12/27/2019 FINDINGS: Right testicle Measurements: 3 x 1.6 x 2.7 cm. No mass or microlithiasis visualized. Left testicle Measurements: 3.4 x 1.9 by 3 cm. Heterogeneous echotexture without discrete mass. Right epididymis:  Heterogenous and slightly edematous.  No mass. Left epididymis: Heterogenous in appearance. Small cyst measuring 5 mm. Hydrocele:  Trace right greater than left hydrocele. Varicocele:  None visualized. Echogenic tissue within the bilateral inguinal regions presumably due to fat containing inguinal hernias seen on CT. IMPRESSION: 1. Overall heterogeneous echotexture of the left testis but without well-defined fluid collection within either testis to suggest  abscess. 2. Trace bilateral hydroceles 3. Soft tissue echogenicity within the inguinal regions bilaterally, likely corresponding to fat filled inguinal hernias on CT. Scrotal anomaly please pick correct template. Electronically Signed   By: Donavan Foil M.D.   On: 01/02/2020 23:35   DG CHEST PORT 1 VIEW  Result Date: 01/14/2020 CLINICAL DATA:  Respiratory distress.  Follow-up exam. EXAM: PORTABLE CHEST 1 VIEW COMPARISON:  01/12/2020 FINDINGS: Opacity at the right lung base is without change from the prior study. Linear opacities noted previously at the left lung base have resolved. Remainder of the lungs is clear. Cardiac silhouette is mildly enlarged. No mediastinal or hilar masses. Small right pleural effusion suspected, as noted on the CT dated 01/04/2020. No left pleural effusion and no pneumothorax. IMPRESSION: 1. Persistent right lung base opacity and small right pleural effusion, unchanged from the prior chest radiograph. 2. Resolved left basilar atelectasis.  No new lung abnormalities. 3. Stable mild cardiomegaly. Electronically Signed   By: Lajean Manes M.D.   On: 01/14/2020 09:03   DG CHEST PORT 1 VIEW  Result Date: 01/12/2020 CLINICAL DATA:  Surgery follow-up EXAM: PORTABLE CHEST 1 VIEW COMPARISON:  01/12/2020 FINDINGS: Interval removal of right chest tube. No visible pneumothorax. Residual right base atelectasis. Left base atelectasis and mild cardiomegaly. IMPRESSION: Interval removal of right chest tube without visible pneumothorax. Otherwise no change. Electronically Signed   By: Rolm Baptise M.D.   On: 01/12/2020 17:00   DG CHEST PORT 1 VIEW  Result Date: 01/12/2020 CLINICAL DATA:  Right pleural effusion.  Right chest tube.  Sepsis. EXAM: PORTABLE CHEST 1 VIEW COMPARISON:  Radiographs dated 01/11/2020 and 01/10/2020 and 01/08/2020 FINDINGS: Right chest tube unchanged in position. No pneumothorax. Slightly improved aeration at the right lung base with a small focal area of stable linear  atelectasis at the right base. No appreciable residual effusion. Heart size and vascularity are normal. Left lung is clear. No acute bone abnormality. IMPRESSION: 1. No pneumothorax. 2. Slightly improved aeration at the right lung base. Electronically Signed   By: Lorriane Shire M.D.   On: 01/12/2020 11:14   DG CHEST PORT 1 VIEW  Result Date: 01/11/2020 CLINICAL DATA:  Chest tube EXAM: PORTABLE CHEST 1 VIEW COMPARISON:  Chest radiograph 01/10/2020 FINDINGS: Stable positioning of a right pigtail chest tube. No pneumothorax. Persistent right greater than left bibasilar atelectasis. No large pleural effusion. Stable cardiomediastinal contours with enlarged heart size. IMPRESSION: Stable chest tube position. No pneumothorax. Electronically Signed   By: Audie Pinto M.D.   On: 01/11/2020 08:34   DG Chest Port 1 View  Result Date: 01/10/2020 CLINICAL DATA:  64 year old male with right side chest tube for treatment of a fusion. EXAM: PORTABLE CHEST 1 VIEW COMPARISON:  Portable chest 01/08/2020 and earlier. FINDINGS: Portable AP semi upright view at 1044 hours. Stable pigtail right chest tube. No pneumothorax identified. Ventilation has further improved at the right lung base, with streaky residual opacity. Stable cardiac size and mediastinal contours. Stable mild left lung base atelectasis. Visualized tracheal air column is within normal limits. No acute osseous abnormality identified. IMPRESSION: 1. Stable right chest tube with further improved right lung base ventilation and no pneumothorax identified. 2. No new cardiopulmonary abnormality. Electronically Signed   By: Genevie Ann M.D.   On: 01/10/2020 10:58   DG Chest Port 1 View  Result Date: 01/08/2020 CLINICAL DATA:  Weakness. EXAM: PORTABLE CHEST 1 VIEW COMPARISON:  Jan 06, 2020 FINDINGS: A right pigtail catheter remains on the right, repositioned. The distal tip of the catheter only partially overlies remaining fluid. There is a right-sided pleural  effusion which is smaller in the interval with underlying opacity. No pneumothorax. No other acute interval changes. IMPRESSION: The right-sided pleural effusion is smaller in the interval. The right chest tube is been repositioned. The distal pigtail only partially overlies remaining fluid. No other changes. Electronically Signed   By: Dorise Bullion III M.D   On: 01/08/2020 12:42   DG CHEST PORT 1 VIEW  Result Date: 01/06/2020 CLINICAL DATA:  Respiratory failure.  Hypoxia. EXAM: PORTABLE CHEST 1 VIEW COMPARISON:  One-view chest x-ray 01/05/2020 FINDINGS: Heart size is exaggerated by low lung volumes. Right pleural effusion and basilar airspace disease is again seen. Moderate pulmonary vascular congestion is stable. IMPRESSION: 1. Stable right pleural effusion and basilar airspace disease. 2. Stable moderate pulmonary vascular congestion. Electronically Signed   By: San Morelle M.D.   On: 01/06/2020 07:46   DG CHEST PORT 1 VIEW  Result Date: 01/04/2020 CLINICAL DATA:  Respiratory failure. EXAM: PORTABLE CHEST 1 VIEW COMPARISON:  Radiographs 01/03/2020 and 01/02/2020. FINDINGS: 0450 hours. Interval extubation and removal of the enteric tube. Stable cardiomegaly, right pleural effusion and probable bibasilar atelectasis. The pulmonary vascularity appears improved without definite residual edema. There is no pneumothorax. The bones appear unchanged. IMPRESSION: Improved pulmonary vascular congestion following extubation. Stable cardiomegaly, right pleural effusion and probable bibasilar atelectasis. Electronically Signed   By: Richardean Sale M.D.   On: 01/04/2020 09:06   DG CHEST PORT 1 VIEW  Result Date: 01/03/2020 CLINICAL DATA:  Respiratory failure EXAM: PORTABLE CHEST 1 VIEW COMPARISON:  Radiograph 01/02/2020, CT 05/17/2019 FINDINGS: Endotracheal tube terminates in the mid to upper trachea, 6 cm from the carina. Transesophageal tube tip terminates below the level of imaging, beyond the GE  junction. Telemetry leads and support devices overlie the chest. Gradient opacity and pleural thickening in the right lung base compatible with layering pleural effusion. Suspect a small left effusion as well with milder atelectatic changes in the left lower lung. Stable cardiomegaly with a calcified aorta. No acute osseous or soft tissue abnormality. Degenerative changes are present in the imaged spine and shoulders. IMPRESSION: 1. Endotracheal tube terminates in the mid to upper trachea, 6 cm from the carina. 2. Transesophageal tube tip terminates below the level of imaging, beyond the GE junction. 3. Stable appearance of the moderate right and trace left effusions with likely areas of adjacent atelectasis. Underlying consolidation is not excluded. 4. Stable cardiomegaly. Electronically Signed   By: Lovena Le M.D.   On: 01/03/2020 06:36   DG CHEST PORT 1 VIEW  Result Date: 01/02/2020 CLINICAL DATA:  Intubation EXAM: PORTABLE CHEST 1 VIEW COMPARISON:  Radiograph 01/01/2020, CT 05/17/2019 FINDINGS: Endotracheal tube in the mid trachea, 3.4 cm from the carina. Transesophageal tube tip is distal to the GE junction, terminating below the level of imaging. Telemetry leads overlie the chest. Gradient opacity and pleural thickening in the right lung base is suspicious for layering pleural effusion likely with adjacent atelectatic change though some underlying consolidation is not excluded. There is slight blunting of the left cardiophrenic sulcus as well which may reflect additional trace pleural fluid. Further atelectatic changes also present in the left lung base. Stable cardiomegaly with a calcified, tortuous aorta. No acute osseous or soft tissue abnormality. IMPRESSION: 1. Gradient opacity and pleural thickening in the right lung base is suspicious for layering pleural effusion likely with adjacent atelectatic change though some underlying consolidation is not excluded. 2. Suspect trace left pleural fluid. 3.  Stable cardiomegaly. 4.  Aortic Atherosclerosis (ICD10-I70.0). Electronically Signed   By: Lovena Le M.D.   On: 01/02/2020 05:46   DG Chest Port 1 View  Result Date: 01/01/2020 CLINICAL DATA:  Repositioning of ET tube. EXAM: PORTABLE CHEST 1 VIEW COMPARISON:  Jan 01, 2020 FINDINGS: The ETT terminates in the mid trachea in good position. The NG tube terminates below today's film. No pneumothorax. Mild opacity remains in the right base. No other interval changes. IMPRESSION: 1. The ETT is in good position. The NG tube terminates below today's film. 2. Linear opacity in the right base is likely either fluid in a fissure or atelectasis. Mild haziness over the right base is favored represent atelectasis rather than infiltrate given the waxing and waning appearance over the last couple of chest x-rays. Recommend attention on follow-up. Electronically Signed   By: Dorise Bullion III M.D   On: 01/01/2020 20:23   Portable Chest x-ray  Result Date: 01/01/2020 CLINICAL DATA:  ETT placement.  OG tube placement. EXAM: PORTABLE CHEST 1 VIEW COMPARISON:  Jan 01, 2020 FINDINGS: The ETT terminates 1 cm above the carina. Recommend withdrawing 2 cm. No pneumothorax. The OG tube terminates below today's film. Mild opacity in the right base favored represent atelectasis. No other interval changes. IMPRESSION: 1. The ETT terminates 1 cm above the carina. Recommend withdrawing 2 cm. 2. The OG tube terminates below today's film. 3. Mild atelectasis in the right base. These results will be called to the ordering clinician or representative by the Radiologist Assistant, and communication documented in the PACS or [employer]. Electronically Signed   By: Dorise Bullion III M.D   On: 01/01/2020 19:05   DG CHEST PORT 1 VIEW  Result Date: 01/01/2020 CLINICAL DATA:  Hypoxemia. EXAM: PORTABLE CHEST 1 VIEW COMPARISON:  Jan 01, 2020 FINDINGS: Stable cardiomegaly. The hila and mediastinum are normal. No pneumothorax. Mild  opacity in the right base is new in the interval. No other acute abnormalities. IMPRESSION: Mild opacity in the right base may represent atelectasis or developing infiltrate. Recommend short-term follow-up imaging to ensure resolution. No other abnormalities. Electronically Signed   By: Dorise Bullion III M.D   On: 01/01/2020 18:25   DG CHEST PORT 1 VIEW  Result Date: 01/01/2020 CLINICAL DATA:  Acute respiratory failure. EXAM: PORTABLE CHEST 1 VIEW COMPARISON:  Dec 31, 2019 FINDINGS: Mild, stable diffusely increased lung markings are seen. Very mild right infrahilar atelectasis is noted. There is no evidence of a pleural effusion or pneumothorax. The cardiac silhouette is moderately enlarged and unchanged in size. The visualized  skeletal structures are unremarkable. IMPRESSION: 1. Very mild right infrahilar atelectasis. 2. Stable cardiomegaly. 3. Stable diffusely increased lung markings which may represent mild pulmonary edema. Electronically Signed   By: Virgina Norfolk M.D.   On: 01/01/2020 15:02   DG CHEST PORT 1 VIEW  Result Date: 12/31/2019 CLINICAL DATA:  64 year old male with a history of shortness of breath EXAM: PORTABLE CHEST 1 VIEW COMPARISON:  12/29/2019 FINDINGS: Cardiomediastinal silhouette unchanged with cardiomegaly. Interlobular septal thickening. Mild patchy airspace opacity in the lower lungs slightly increased from the comparison. No pneumothorax or pleural effusion. IMPRESSION: Evidence of early pulmonary edema and basilar atelectasis. Electronically Signed   By: Corrie Mckusick D.O.   On: 12/31/2019 09:43   DG Chest Port 1 View  Result Date: 12/27/2019 CLINICAL DATA:  Weakness and lethargy EXAM: PORTABLE CHEST 1 VIEW COMPARISON:  12/15/2019 FINDINGS: Stable borderline heart size and mildly tortuous aortic contours. Mild interstitial crowding at the bases. There is no edema, consolidation, effusion, or pneumothorax. IMPRESSION: No acute finding. Electronically Signed   By: Monte Fantasia M.D.   On: 12/27/2019 05:31   DG Knee Right Port  Result Date: 12/27/2019 CLINICAL DATA:  [place] 1 week ago. Persistent knee pain. EXAM: PORTABLE RIGHT KNEE - 1-2 VIEW COMPARISON:  None. FINDINGS: Mild tricompartmental degenerative changes but no acute bony findings or osteochondral lesion. No joint effusion. Age advanced vascular calcifications are noted. IMPRESSION: Mild degenerative changes but no acute bony findings or joint effusion. Age advanced vascular calcifications. Electronically Signed   By: Marijo Sanes M.D.   On: 12/27/2019 07:06   EEG adult  Result Date: 01/02/2020 Lora Havens, MD     01/02/2020  9:22 AM Patient Name: Marco [name] MRN: [id number] Epilepsy Attending: Lora Havens Referring Physician/Provider: Dr. Shelly Coss Date: 01/02/2020 Duration: 25.52 mins Patient history: 64 year old male with altered mental status.  EEG evaluate for seizures. Level of alertness: Comatose/sedated AEDs during EEG study: Propofol Technical aspects: This EEG study was done with scalp electrodes positioned according to the 10-20 International system of electrode placement. Electrical activity was acquired at a sampling rate of 500Hz  and reviewed with a high frequency filter of 70Hz  and a low frequency filter of 1Hz . EEG data were recorded continuously and digitally stored. Description: EEG showed continuous generalized polymorphic 3 to 6 Hz theta-delta slowing.  EEG was reactive to tactile stimulation. Hyperventilation and photic stimulation were not performed.   ABNORMALITY -Continuous slow, generalized IMPRESSION: This study is suggestive of severe diffuse encephalopathy, nonspecific etiology but could be related to sedation. No seizures or epileptiform discharges were seen throughout the recording. Lora Havens   ECHOCARDIOGRAM COMPLETE  Result Date: 12/31/2019    ECHOCARDIOGRAM REPORT   Patient Name:   Marco [name] Date of Exam: 12/31/2019 Medical Rec #:  [id number]     Height:        66.0 in Accession #:    [id number]    Weight:       238.8 lb Date of Birth:  [birth year]     BSA:          2.156 m Patient Age:    35 years      BP:           180/78 mmHg Patient Gender: M             HR:           95 bpm. Exam Location:  Inpatient Procedure: 2D Echo Indications:    fever 780.6  History:        Patient has prior history of Echocardiogram examinations. CHF,                 CAD, Arrythmias:Atrial Fibrillation; Risk Factors:Diabetes,                 Hypertension and Former Smoker.  Sonographer:    Jannett Celestine RDCS (AE) Referring Phys: [id number] [facility]  Sonographer Comments: Image acquisition challenging due to respiratory motion. restricted mobility. patient on BiPap machine during exam IMPRESSIONS  1. Mild hypokinesis of the distal septum and apex. Left ventricular ejection fraction, by estimation, is 50 to 55%. The left ventricle has low normal function. The left ventricle demonstrates regional wall motion abnormalities (see scoring diagram/findings for description). There is mild concentric left ventricular hypertrophy. Left ventricular diastolic parameters are consistent with Grade I diastolic dysfunction (impaired relaxation).  2. Right ventricular systolic function is normal. The right ventricular size is normal.  3. Right atrial size was mildly dilated.  4. The mitral valve is normal in structure. No evidence of mitral valve regurgitation. No evidence of mitral stenosis.  5. The aortic valve is tricuspid. Aortic valve regurgitation is not visualized. No aortic stenosis is present.  6. The inferior vena cava is normal in size with greater than 50% respiratory variability, suggesting right atrial pressure of 3 mmHg. FINDINGS  Left Ventricle: Mild hypokinesis of the distal septum and apex. Left ventricular ejection fraction, by estimation, is 50 to 55%. The left ventricle has low normal function. The left ventricle demonstrates regional wall motion abnormalities. The left ventricular  internal cavity size was normal in size. There is mild concentric left ventricular hypertrophy. Left ventricular diastolic parameters are consistent with Grade I diastolic dysfunction (impaired relaxation). Indeterminate filling pressures. Right Ventricle: The right ventricular size is normal. No increase in right ventricular wall thickness. Right ventricular systolic function is normal. Left Atrium: Left atrial size was normal in size. Right Atrium: Right atrial size was mildly dilated. Pericardium: There is no evidence of pericardial effusion. Mitral Valve: The mitral valve is normal in structure. Normal mobility of the mitral valve leaflets. No evidence of mitral valve regurgitation. No evidence of mitral valve stenosis. Tricuspid Valve: The tricuspid valve is normal in structure. Tricuspid valve regurgitation is trivial. No evidence of tricuspid stenosis. Aortic Valve: The aortic valve is tricuspid. Aortic valve regurgitation is not visualized. No aortic stenosis is present. Pulmonic Valve: The pulmonic valve was normal in structure. Pulmonic valve regurgitation is not visualized. No evidence of pulmonic stenosis. Aorta: The aortic root is normal in size and structure. Venous: The inferior vena cava is normal in size with greater than 50% respiratory variability, suggesting right atrial pressure of 3 mmHg. IAS/Shunts: No atrial level shunt detected by color flow Doppler.  LEFT VENTRICLE PLAX 2D LVIDd:         5.30 cm  Diastology LVIDs:         3.70 cm  LV e' lateral:   6.20 cm/s LV PW:         1.20 cm  LV E/e' lateral: 12.3 LV IVS:        1.20 cm  LV e' medial:    5.77 cm/s LVOT diam:     2.20 cm  LV E/e' medial:  13.2 LV SV:         64 LV SV Index:   30 LVOT Area:     3.80 cm  RIGHT VENTRICLE RV S prime:  15.30 cm/s TAPSE (M-mode): 2.0 cm LEFT ATRIUM             Index       RIGHT ATRIUM           Index LA diam:        3.20 cm 1.48 cm/m  RA Area:     23.10 cm LA Vol (A2C):   49.6 ml 23.00 ml/m RA Volume:    70.00 ml  32.47 ml/m LA Vol (A4C):   45.3 ml 21.01 ml/m LA Biplane Vol: 51.4 ml 23.84 ml/m  AORTIC VALVE LVOT Vmax:   112.00 cm/s LVOT Vmean:  83.800 cm/s LVOT VTI:    0.168 m  AORTA Ao Root diam: 3.20 cm MITRAL VALVE               TRICUSPID VALVE MV Area (PHT): 3.77 cm    TR Peak grad:   28.3 mmHg MV Decel Time: 201 msec    TR Vmax:        266.00 cm/s MV E velocity: 76.30 cm/s MV A velocity: 87.80 cm/s  SHUNTS MV E/A ratio:  0.87        Systemic VTI:  0.17 m                            Systemic Diam: 2.20 cm Skeet Latch MD Electronically signed by Skeet Latch MD Signature Date/Time: 12/31/2019/12:49:48 PM    Final    CT IMAGE GUIDED DRAINAGE BY PERCUTANEOUS CATHETER  Result Date: 01/01/2020 INDICATION: 64 year old with inflammation around a right renal cyst. Concern for an infected right renal cyst. Request for percutaneous drainage. EXAM: CT-GUIDED DRAINAGE OF RIGHT RENAL CYST MEDICATIONS: Moderate sedation ANESTHESIA/SEDATION: 1.0 mg IV Versed 25 mcg IV Fentanyl Moderate Sedation Time:  88 minutes The patient was continuously monitored during the procedure by the interventional radiology nurse under my direct supervision. COMPLICATIONS: None immediate. TECHNIQUE: Informed consent was obtained from the patient's family after a thorough discussion of the procedural risks, benefits and alternatives. All questions were addressed. Maximal Sterile Barrier Technique was utilized including caps, mask, sterile gowns, sterile gloves, sterile drape, hand hygiene and skin antiseptic. A timeout was performed prior to the initiation of the procedure. PROCEDURE: Patient was placed prone on the CT scanner. Images through the abdomen were obtained. The hyperdense cyst along the posterior aspect of the right kidney was targeted. The right flank was prepped with chlorhexidine and sterile field was created. Skin and soft tissues were anesthetized with 1% lidocaine. Small incision was made. Using CT guidance, an 18  gauge trocar needle was initially directed into the fluid around the cyst. Thick bloody fluid was aspirated. Needle was eventually directed into the hyperdense cyst and additional dark bloody fluid was aspirated. Superstiff Amplatz wire was advanced into the collection and tract was dilated to accommodate a 10 [place] multipurpose drain. However, the follow-up CT images demonstrated that do drain did not stay within the cystic collection but rather went cephalad to the kidney. Pulled the drain back but the side holes were outside of the abdomen. As a result, this drain was completely removed. Therefore, a 18 gauge trocar needle was directed back into the renal cyst using a combination of ultrasound and CT guidance. Wire position was confirmed within the cyst and the tract was dilated to accommodate a 10 French Bettey Mare catheter. This catheter was felt to be within this cyst and additional bloody fluid was removed. Approximately 30  mL of bloody fluid was removed throughout the procedure. Catheter was attached to a suction bulb and sutured to the skin. Dressing was placed. FINDINGS: Again noted is a hyperdense exophytic structure suggestive for hemorrhagic cyst in the posterior right kidney upper pole. Inflammatory changes and fluid around the cyst are similar to the recent CT from 12/28/2019. It was difficult to place a drain within this cyst due to the small size and configuration. Eventually, 10 French Bettey Mare catheter was placed within the cyst and a total of 30 mL of bloody fluid was removed from the cyst and surrounding tissues. IMPRESSION: CT-guided placement of a drainage catheter within the hemorrhagic right renal cyst. Fluid was sent for culture. Electronically Signed   By: Markus Daft M.D.   On: 01/01/2020 16:39   US Abdomen Limited RUQ  Result Date: 01/05/2020 CLINICAL DATA:  Elevated LFTs EXAM: ULTRASOUND ABDOMEN LIMITED RIGHT UPPER QUADRANT COMPARISON:  None. FINDINGS: Gallbladder: No  gallstones or wall thickening visualized. No sonographic Murphy sign noted by sonographer. Common bile duct: Diameter: 3 mm Liver: Slight increased echogenicity and heterogeneity is noted without focal mass. No biliary ductal dilatation is noted. Portal vein is patent on color Doppler imaging with normal direction of blood flow towards the liver. Other: None. IMPRESSION: Mild increased echogenicity likely related to fatty infiltration. Electronically Signed   By: Inez Catalina M.D.   On: 01/05/2020 16:39   IR PERC PLEURAL DRAIN W/INDWELL CATH W/IMG GUIDE  Result Date: 01/11/2020 INDICATION: 64 year old with complex right pleural effusion. Request for chest tube placement.  EXAM: IMAGE GUIDED RIGHT CHEST TUBE PLACEMENT  MEDICATIONS: Moderate sedation  ANESTHESIA/SEDATION: Fentanyl 50 mcg IV; Versed 1.0 mg IV  Moderate Sedation Time:  18 minutes  The patient was continuously monitored during the procedure by the interventional radiology nurse under my direct supervision.  COMPLICATIONS: None immediate.  PROCEDURE: Informed written consent was obtained from the patient's wife after a thorough discussion of the procedural risks, benefits and alternatives. All questions were addressed. Maximal Sterile Barrier Technique was utilized including caps, mask, sterile gowns, sterile gloves, sterile drape, hand hygiene and skin antiseptic. A timeout was performed prior to the initiation of the procedure.  Patient was rolled on his left side. Right chest was evaluated with ultrasound. Adequate pleural pocket was identified with ultrasound in the right mid axillary region. The right side of the chest was prepped and draped in sterile fashion. Skin was anesthetized with 1% lidocaine. Small incision was made. Using ultrasound guidance, a Yueh catheter was directed into the pleural space and yellow pleural fluid was aspirated. Superstiff Amplatz wire was advanced into the pleural space and the tract was dilated to  accommodate a 14 [place] multipurpose drain. Catheter was sutured to skin and attached to a PleurEvac collecting system.  Fluoroscopic and ultrasound images were taken and saved for documentation.  FINDINGS: Right pleural fluid has some loculations. 28 French drain was placed within the right pleural collection. Yellow fluid was draining from the chest tube at the end of the procedure.  IMPRESSION: Successful placement of right chest tube using image guidance.   Electronically Signed   By: Markus Daft M.D.   On: 01/07/2020 11:35   US THORACENTESIS ASP PLEURAL SPACE W/IMG GUIDE  Result Date: 01/05/2020 INDICATION: Recent extubation. Please perform ultrasound-guided right-sided thoracentesis for diagnostic and therapeutic purposes. EXAM: US THORACENTESIS ASP PLEURAL SPACE W/IMG GUIDE COMPARISON:  Chest CT-01/04/2020; chest radiograph-01/04/2020 MEDICATIONS: None. COMPLICATIONS: None immediate. TECHNIQUE: Informed written consent was obtained from the  patient after a discussion of the risks, benefits and alternatives to treatment. A timeout was performed prior to the initiation of the procedure. With the patient positioned right lateral decubitus in his hospital bed, ultrasound scanning was performed of the right hemithorax demonstrating a small complex right-sided pleural effusion, similar to the findings seen on preceding chest CT performed 12/15/2019. As such, the inferior, posterior, lateral aspect of the right hemithorax was prepped and draped in the usual sterile fashion. 1% lidocaine was used for local anesthesia. Under direct ultrasound guidance, a 19 gauge, 7-cm, Yueh catheter was introduced. An ultrasound image was saved for documentation purposes. The thoracentesis was performed, however despite mobilization of the Yueh catheter, only approximately 25 cc of serous fluid was able to be aspirated. The catheter was removed and a dressing was applied. The patient tolerated the procedure well without  immediate post procedural complication. The patient was escorted to have an upright chest radiograph. FINDINGS: Small complex right-sided pleural effusion, similar to the findings on preceding chest CT. IMPRESSION: Successful ultrasound-guided right sided thoracentesis yielding only 25 cc of serous pleural fluid. Electronically Signed   By: Sandi Mariscal M.D.   On: 01/05/2020 16:31    Microbiology: No results found for this or any previous visit (from the past 240 hour(s)).   Labs: CBC: Recent Labs  Lab 01/10/20 0357 01/11/20 0540 01/12/20 0254 01/13/20 0855  WBC 11.7* 11.0* 11.8* 12.2*  NEUTROABS  --   --   --  8.0*  HGB 7.5* 7.4* 7.6* 8.0*  HCT 24.5* 24.0* 24.3* 25.5*  MCV 91.1 90.6 90.3 90.7  PLT 431* 471* 479* 762*   Basic Metabolic Panel: Recent Labs  Lab 01/10/20 0357 01/11/20 0540 01/12/20 0254 01/13/20 0855  NA 141 139 138 139  K 4.5 4.5 4.3 4.1  CL 104 104 103 102  CO2 27 28 27 28   GLUCOSE 133* 98 95 103*  BUN 18 17 15 12   CREATININE 1.49* 1.36* 1.32* 1.42*  CALCIUM 8.3* 8.3* 8.1* 8.2*  MG 1.7 1.8 1.7  --    Liver Function Tests: Recent Labs  Lab 01/13/20 0855  AST 25  ALT 65*  ALKPHOS 72  BILITOT 0.5  PROT 6.6  ALBUMIN 1.8*   No results for input(s): LIPASE, AMYLASE in the last 168 hours. No results for input(s): AMMONIA in the last 168 hours. Cardiac Enzymes: No results for input(s): CKTOTAL, CKMB, CKMBINDEX, TROPONINI in the last 168 hours. BNP (last 3 results) Recent Labs    09/26/19 1116 12/31/19 1202 01/01/20 1846  BNP 49.4 1,407.8* 497.6*   CBG: Recent Labs  Lab 01/13/20 0627 01/13/20 1157 01/13/20 1644 01/13/20 2126 01/14/20 0610  GLUCAP 111* 106* 166* 141* 87    Time spent: 35 minutes  Signed:  Berle Mull  [facility] 01/14/2020 1:25 PM

## 2020-01-16 NOTE — Telephone Encounter (Signed)
Transition Care Management Follow-up Telephone Call  Date of discharge and from where: 01/14/2020, [facility]   How have you been since you were released from the hospital? He stated that he is doing okay.  Any questions or concerns?  he is concerned about stopping the entresto as noted on has AVS and he will call cardiology to confirm that he is to stop taking it.   Items Reviewed:  Did the pt receive and understand the discharge instructions provided?  yes, as noted above he will call cardiology regarding the entresto.  Medications obtained and verified? he stated that he still needs to pick up the new medications. He said he was good with the medications and did not need to review the list, He also noted that he will not be picking up the tramadol and will not take it.  He stated that he can't stop taking the oxycodone as it has been ordered by his pain management provider at [facility]  Any new allergies since your discharge? none reported   Do you have support at home? yes  Other (ie: DME, Home Health, etc) home health PT/OT/HHA ordered through [place], [facility] noted as 01/18/2020.  Patient is interested in [facility] for assistance with bathing/dressing.  referral will need to be made if PCP in agreement.  Has glucometer. Blood sugar yesterday  - 100 and today it was 99.   O2 from [employer]. Using it at 2L/min when ambulating. He said he does not need it all of the time.   He said that he does not have any dressings to change.   Functional Questionnaire: (I = Independent and D = Dependent) ADL's: has rollator   Follow up appointments reviewed:    PCP Hospital f/u appt confirmed?he wanted to wait to see Dr Wynetta Emery - 02/16/2020 @ 1050.  [facility] f/u appt confirmed? CTS - 01/20/2020, Pulmonary - 02/02/2020, CHF - 02/23/2020  Are transportation arrangements needed? no  If their condition worsens, is the pt aware to call  their PCP or go to the ED? yes  Was the  patient provided with contact information for the PCP's office or ED?he has the contact information  Was the pt encouraged to call back with questions or concerns?  yes

## 2020-01-18 ENCOUNTER — Other Ambulatory Visit: Payer: Self-pay | Admitting: Internal Medicine

## 2020-01-18 DIAGNOSIS — G4700 Insomnia, unspecified: Secondary | ICD-10-CM

## 2020-01-19 ENCOUNTER — Other Ambulatory Visit: Payer: Self-pay | Admitting: Thoracic Surgery (Cardiothoracic Vascular Surgery)

## 2020-01-19 ENCOUNTER — Other Ambulatory Visit: Payer: Self-pay | Admitting: Internal Medicine

## 2020-01-19 DIAGNOSIS — J90 Pleural effusion, not elsewhere classified: Secondary | ICD-10-CM

## 2020-01-20 ENCOUNTER — Ambulatory Visit: Payer: Medicaid Other | Admitting: Thoracic Surgery (Cardiothoracic Vascular Surgery)

## 2020-01-24 MED FILL — VYNDAMAX 61 MG CAPS: 61 | 30 days supply | Qty: 30 | Fill #4

## 2020-01-25 ENCOUNTER — Telehealth: Payer: Self-pay | Admitting: Hematology and Oncology

## 2020-01-25 NOTE — Telephone Encounter (Signed)
Received staff msg to get Marco [name] scheduled to see Dr. Lorenso Courier. Marco [name] has been cld and scheduled to see Lorenso Courier on 6/25 at 9am. Pt aware to arrive 15 minutes early.

## 2020-02-02 ENCOUNTER — Ambulatory Visit (INDEPENDENT_AMBULATORY_CARE_PROVIDER_SITE_OTHER): Payer: Medicare Other | Admitting: Internal Medicine

## 2020-02-02 ENCOUNTER — Other Ambulatory Visit: Payer: Self-pay

## 2020-02-02 ENCOUNTER — Encounter: Payer: Self-pay | Admitting: Internal Medicine

## 2020-02-02 ENCOUNTER — Ambulatory Visit: Payer: Medicaid Other | Admitting: Thoracic Surgery (Cardiothoracic Vascular Surgery)

## 2020-02-02 VITALS — BP 116/78 | HR 60 | Temp 97.2°F | Ht 66.0 in | Wt 229.6 lb

## 2020-02-02 DIAGNOSIS — J90 Pleural effusion, not elsewhere classified: Secondary | ICD-10-CM

## 2020-02-02 DIAGNOSIS — G4733 Obstructive sleep apnea (adult) (pediatric): Secondary | ICD-10-CM | POA: Diagnosis not present

## 2020-02-02 NOTE — Patient Instructions (Addendum)
Order- DME [employer]- please service CPAP machine, he says pressure seems different. Continue VPAP auto 14/11, PS2, mask of choice,e humidifier, supplies, Airview/ card  Please call if we can help

## 2020-02-02 NOTE — Progress Notes (Signed)
HPI male former smoker followed for OSA/ Insomnia, dyspnea on exertion, complicated by chronic pain, anxiety, DM 2, history cardiac ablation, gout NPSG 37.8/hour on 06/14/2015, desaturation to 76%, body weight 223 pounds BiPAP titration 08/19/2015- to 20/16 Barium Swallow 02/07/2016-normal CXR 01/22/2016-NAD PFT 02/22/2016-minimal restriction, mild diffusion deficit. FVC 2.62/76%, FEV1 2.14/81%, ratio 0.82, DLCO 61%, TLC 79%, RV/TLC 135%. 6MWT- 02/22/16-97%, 98%, 98%. Stopped after only 52 m complaining of chest tightness, headache, shortness of breath, dizziness. BP and heart rate were unremarkable. Unattended Home Sleep Test-05/25/2016-AHI 36.9/hour, desaturation to 82%, body weight 212 pounds   ----------------------------------------------------------------------------   10/03/19 Virtual Visit via Telephone Note  History of Present Illness: 64 year old male former smoker followed for OSA/ Insomnia, dyspnea on exertion, complicated by chronic pain, anxiety, DM 2/retinopathy, history cardiac ablation, gout/arthritis, CHF/ CM, CKD, HBP, GERD, psoriasis, gout VPAP auto 14/ 11, PS2/ [employer] Download compliance 90%, AHI 29.2/ hr. Central and obstructive Sometimes SOB w exertion- chronic complaint. No routine cough or wheeze. No new cardiac issues.  Stuffy nose using VPAP. PCP gave flonase- hasn't tried yet.    Observations/Objective:   Assessment and Plan: OSA- poor control, good compliance. See if improving rhinitis helps. Rhinitis- probably vasomotor and allergic- try flonase before afrin  Follow Up Instructions: 4 months  --------------- 02/02/20- 64 year old male former smoker followed for OSA/ Insomnia, dyspnea on exertion, complicated by chronic pain, anxiety, DM 2/retinopathy, history cardiac ablation, gout/arthritis, CHF/ CM, CKD, HBP, GERD, psoriasis, gout, Obesity,  VPAP auto 14/ 11, PS2/ [facility] 5/18-01/14/20 for pyelonephritis with sepsis Ambien 6.25 CR, Albuterol  hfa, Download compliance 53%( was in hosp), AHI 6.8/ hr Body weight today- 229 lbs Say s pressure doesn't "feel right". Has f/u w [facility] about pleural effusion. Covax- 2 Phizer CXR 01/12/20- 1V-  IMPRESSION: 1. Persistent right lung base opacity and small right pleural effusion, unchanged from the prior chest radiograph. 2. Resolved left basilar atelectasis.  No new lung abnormalities. 3. Stable mild cardiomegaly.   ROS-see HPI    "+" = positive Constitutional:    weight loss, night sweats, fevers, chills, fatigue, lassitude. HEENT:    headaches, + difficulty swallowing, tooth/dental problems, sore throat,       sneezing, itching, ear ache, nasal congestion, post nasal drip, snoring CV:     chest pain, orthopnea, PND, + swelling in lower extremities, anasarca,                                                          dizziness, palpitations Resp:  + shortness of breath with exertion or at rest.                productive cough,   non-productive cough, coughing up of blood.              change in color of mucus.  wheezing.   Skin:    rash or lesions. GI:  No-   heartburn, indigestion, + abdominal pain, nausea, vomiting, diarrhea,                 change in bowel habits, loss of appetite GU: dysuria, change in color of urine, no urgency or frequency.   flank pain. MS:   + joint pain, stiffness, decreased range of motion, back pain. Neuro-     nothing unusual Psych:  change in mood  or affect.  depression or anxiety.   memory loss.  OBJ- Physical Exam General- Alert, Oriented, Affect-appropriate, Distress- none acute, + obese, + walker Skin- rash-none, lesions- none, excoriation- none Lymphadenopathy- none Head- atraumatic            Eyes- Gross vision intact, PERRLA, conjunctivae and secretions clear            Ears- Hearing, canals-normal            Nose- Clear, no-Septal dev, mucus, polyps, erosion, perforation             Throat- Mallampati IV , mucosa clear-not particularly dry ,  drainage- none, tonsils- atrophic,                  +many missing teeth Neck- flexible , trachea midline, no stridor , thyroid nl, carotid no bruit Chest - symmetrical excursion , unlabored           Heart/CV- RRR , no murmur , no gallop  , no rub, nl s1 s2                           - JVD- none , edema- none, stasis changes- none, varices- none           Lung- clear to P&A, wheeze- none, cough- none , dullness-none, rub- none           Chest wall-  Abd-  Br/ Gen/ Rectal- Not done, not indicated Extrem- cyanosis- none, clubbing, none, atrophy- none, strength- nl Neuro- grossly intact to observation

## 2020-02-03 ENCOUNTER — Inpatient Hospital Stay: Payer: Medicare Other | Attending: Hematology and Oncology | Admitting: Hematology and Oncology

## 2020-02-03 ENCOUNTER — Inpatient Hospital Stay: Payer: Medicare Other

## 2020-02-03 LAB — FUNGUS CULTURE WITH STAIN

## 2020-02-03 LAB — FUNGUS CULTURE RESULT

## 2020-02-03 LAB — FUNGAL ORGANISM REFLEX

## 2020-02-06 ENCOUNTER — Telehealth (HOSPITAL_COMMUNITY): Payer: Self-pay | Admitting: Pharmacist

## 2020-02-06 NOTE — Telephone Encounter (Signed)
Patient Advocate Encounter   Received notification from [employer] that prior authorization for Vyndamax is required.   PA submitted on CoverMyMeds Key [id number] Status is pending   Will continue to follow.  Audry Riles, PharmD, BCPS, BCCP, CPP Heart Failure Clinic Pharmacist [phone]

## 2020-02-07 NOTE — Telephone Encounter (Signed)
Advanced Heart Failure Patient Advocate Encounter  Prior Authorization for Marco [name] has been approved.    PA# [id number] Effective dates: 02/07/2020 through 08/10/2020  Audry Riles, PharmD, BCPS, BCCP, CPP Heart Failure Clinic Pharmacist [phone]

## 2020-02-10 ENCOUNTER — Encounter: Payer: Self-pay | Admitting: Thoracic Surgery (Cardiothoracic Vascular Surgery)

## 2020-02-10 ENCOUNTER — Ambulatory Visit (INDEPENDENT_AMBULATORY_CARE_PROVIDER_SITE_OTHER): Payer: Medicare Other | Admitting: Thoracic Surgery (Cardiothoracic Vascular Surgery)

## 2020-02-10 ENCOUNTER — Other Ambulatory Visit: Payer: Self-pay

## 2020-02-10 ENCOUNTER — Ambulatory Visit
Admission: RE | Admit: 2020-02-10 | Discharge: 2020-02-10 | Disposition: A | Discharge status: Home / Self Care | Payer: Medicare Other | Source: Ambulatory Visit | Attending: Thoracic Surgery (Cardiothoracic Vascular Surgery) | Admitting: Thoracic Surgery (Cardiothoracic Vascular Surgery)

## 2020-02-10 ENCOUNTER — Other Ambulatory Visit: Payer: Self-pay | Admitting: Internal Medicine

## 2020-02-10 VITALS — BP 160/80 | HR 85 | Temp 97.9°F | Resp 20 | Ht 66.0 in | Wt 230.0 lb

## 2020-02-10 DIAGNOSIS — N2889 Other specified disorders of kidney and ureter: Secondary | ICD-10-CM

## 2020-02-10 DIAGNOSIS — J90 Pleural effusion, not elsewhere classified: Secondary | ICD-10-CM | POA: Diagnosis not present

## 2020-02-10 NOTE — Progress Notes (Signed)
     [address]       [place] [address]             [phone]       Marco [name] presents in follow-up.  He complains of back pain that is made worse with movement.  He denies any shortness of breath or pleuritic chest pain.  On exam he appears well.  His respiratory status is nonlabored.  Chest wall is clear.  64 year old male with a right-sided parapneumonic effusion status post image guided drain placement with TPA thrombolytic therapy.  On review of his chest x-ray as lung is fully expanded and there is a trace right effusion Follow-up as needed.

## 2020-02-13 ENCOUNTER — Other Ambulatory Visit: Payer: Self-pay | Admitting: Endocrinology

## 2020-02-13 DIAGNOSIS — E118 Type 2 diabetes mellitus with unspecified complications: Secondary | ICD-10-CM

## 2020-02-13 MED FILL — ACCU-CHEK GUIDE TEST STRIP: 50 days supply | Qty: 100 | Fill #0

## 2020-02-13 MED FILL — HUMALOG MIX 75-25 KWIKPEN: (75-25) 100 | 90 days supply | Qty: 144 | Fill #0

## 2020-02-16 ENCOUNTER — Other Ambulatory Visit: Payer: Self-pay

## 2020-02-16 ENCOUNTER — Ambulatory Visit: Payer: Medicare Other | Attending: Internal Medicine | Admitting: Internal Medicine

## 2020-02-16 ENCOUNTER — Encounter (HOSPITAL_COMMUNITY): Payer: Medicaid Other

## 2020-02-16 ENCOUNTER — Encounter: Payer: Self-pay | Admitting: Internal Medicine

## 2020-02-16 VITALS — BP 144/79 | HR 75 | Temp 99.3°F | Resp 16 | Wt 229.2 lb

## 2020-02-16 DIAGNOSIS — E11649 Type 2 diabetes mellitus with hypoglycemia without coma: Secondary | ICD-10-CM | POA: Diagnosis not present

## 2020-02-16 DIAGNOSIS — E1159 Type 2 diabetes mellitus with other circulatory complications: Secondary | ICD-10-CM

## 2020-02-16 DIAGNOSIS — E538 Deficiency of other specified B group vitamins: Secondary | ICD-10-CM

## 2020-02-16 DIAGNOSIS — I10 Essential (primary) hypertension: Secondary | ICD-10-CM

## 2020-02-16 DIAGNOSIS — G4700 Insomnia, unspecified: Secondary | ICD-10-CM

## 2020-02-16 DIAGNOSIS — Z9181 History of falling: Secondary | ICD-10-CM

## 2020-02-16 DIAGNOSIS — G4733 Obstructive sleep apnea (adult) (pediatric): Secondary | ICD-10-CM

## 2020-02-16 DIAGNOSIS — F1190 Opioid use, unspecified, uncomplicated: Secondary | ICD-10-CM

## 2020-02-16 DIAGNOSIS — K5903 Drug induced constipation: Secondary | ICD-10-CM

## 2020-02-16 DIAGNOSIS — I5042 Chronic combined systolic (congestive) and diastolic (congestive) heart failure: Secondary | ICD-10-CM | POA: Diagnosis not present

## 2020-02-16 DIAGNOSIS — Z794 Long term (current) use of insulin: Secondary | ICD-10-CM

## 2020-02-16 DIAGNOSIS — D631 Anemia in chronic kidney disease: Secondary | ICD-10-CM

## 2020-02-16 DIAGNOSIS — Z09 Encounter for follow-up examination after completed treatment for conditions other than malignant neoplasm: Secondary | ICD-10-CM | POA: Diagnosis not present

## 2020-02-16 DIAGNOSIS — N182 Chronic kidney disease, stage 2 (mild): Secondary | ICD-10-CM

## 2020-02-16 DIAGNOSIS — Z9989 Dependence on other enabling machines and devices: Secondary | ICD-10-CM

## 2020-02-16 LAB — GLUCOSE, POCT (MANUAL RESULT ENTRY): POC Glucose: 250 mg/dl — AB (ref 70–99)

## 2020-02-16 MED ORDER — FUROSEMIDE 40 MG PO TABS: ORAL_TABLET | ORAL | 0 refills | Status: DC

## 2020-02-16 MED ORDER — CYANOCOBALAMIN 1000 MCG PO TABS: 1000.0000 ug | ORAL_TABLET | Freq: Every day | ORAL | 1 refills | Status: DC

## 2020-02-16 MED ORDER — NALOXONE HCL 4 MG/0.1ML NA LIQD: NASAL | 0 refills | Status: AC

## 2020-02-16 MED FILL — NARCAN 4 MG NASAL SPRAY: 4 | 1 days supply | Qty: 2 | Fill #0

## 2020-02-16 NOTE — Progress Notes (Signed)
Patient ID: Marco [name], male    DOB: 10-Jul-1956  MRN: [id number]  CC: transition of Care Date of admission 5/18-01/14/2020 Date of telephone call from case manager 01/16/2020  Subjective: Marco [name] is a 64 y.o. male who presents for transition of care visit.  His wife is with him. His concerns today include:  history of HTN,DM2 with neuropathy, and nephropathy, CKD stage III, obesity, nonobstructive CAD/cardiac amyloid, chronic combined CHF EF 55-60%, PAF s/p ablation 2017 (Xarelto d/c due to recurrent anemia), OSA on VPAP, followed by rheumatology in [facility] for psoriasis/gout/nonspec arthritis, chronic insomnia. On narcotics through [facility] for chronic pain inchest/back/shoulders/neck.  Patient did not bring medications with him  Patient was admitted with complaints of weakness, lethargy and falls at home.  Found to be septic secondary to right-sided pyelonephritis with infected cyst. -underwent CT-guided drain placement RT kidney cysy and cultures grew multidrug-resistant E. coli.  He was followed by urology and ID.  -found to have right-sided lung loculated effusion which was drained with chest tube.  He was followed by CT surgeon.  By the time of discharge drainage tube from the right kidney and lung were removed.  Of note, patient was intubated during this hospitalization.  -had acute kidney injury where his creatinine peaked at 2.13.  By the time of discharge it was 1.49.  Hospital course complicated by elevated LFTs thought to be secondary to hypotension but improved by the time of discharge.  Right upper quadrant ultrasound revealed fatty liver disease.  Hepatitis panel negative.  He also had developed pressure ulcer on the left and right buttock.  Followed by cardiology for chronic combined CHF.  EF 50-55% improved from last year.  Patient continued on aspirin and Coreg.  [name] was held due to soft blood pressure readings.  Today: Sepsis: no fever or flank pain  since hosp discgh.  Wife reports that he has fallen several times since being home.  She states that he tries to get up too fast and tries to ambulate without the walker that was given to him at the hospital.  He is supposed to have home physical therapy but they missed the call and try to call back unsuccessfully.  He feels he would benefit from it.  Note, he was also found to have B12 deficiency B12 level of 104 2 months ago.  Should be on B12 supplement but he does not have it. -Was told to hold Cosentyx until reevaluated outpatient and also told to hold oxycodone.  Given tramadol instead.  However patient restarted both once discharged home. -Wife reports that he has no ulcers on the buttock at this time. -Request form completion for him to get a handicap sticker  Complains of increased daytime sleepiness.  He is on VPAP for OSA.  Recently seen by his pulmonologist whose assessment was that his OSA is poorly controlled.  Patient states they are working on adjusting his machine.  He also takes Ambien at night and is on oxycodone 5 mg 3 times a day.  He does not have scription for Narcan.  DM: He did not check his blood sugar for the last few days.  However prior to that he reports that his blood sugars have been running 98-109.  Blood sugar this morning was 146.  Doing okay with eating habits.  He is on Humalog 75/25 100 units with breakfast.  HTN-CHF: Took meds already for the morning.  He was discharged home on low-dose of furosemide.  However  wife reports that he started experiencing increased lower extremity edema so he went back to taking furosemide 40 mg twice a day.  Patient with chronic anemia.  Supposed to be on iron but not taking because it makes him constipated.  He does have MiraLAX at home but has not been using that either.   Patient Active Problem List   Diagnosis Date Noted  . Elevated LFTs   . Pleural effusion   . Weakness   . Loculated pleural effusion   . Complex renal  cyst   . Urethral stricture 12/30/2019  . Nephrolithiasis 12/30/2019  . Acute febrile illness 12/30/2019  . Sepsis ([place]) 12/27/2019  . Severe sepsis ([place]) 12/10/2019  . Acute metabolic encephalopathy 59/16/3846  . Hyperkalemia 12/09/2019  . Sepsis due to Gram negative bacteria ([place]) 12/09/2019  . Rhinitis 10/16/2019  . Gastroesophageal reflux disease without esophagitis 08/31/2018  . Muscle spasm 08/31/2018  . NICM (nonischemic cardiomyopathy) ([place]) 07/06/2018  . Hypokalemia   . Acute renal failure superimposed on stage 3 chronic kidney disease ([place]) 08/27/2017  . Hyperlipidemia 12/23/2016  . Chronic combined systolic and diastolic CHF (congestive heart failure) ([place]) 09/09/2016  . Acute respiratory failure with hypoxia ([place]) 09/02/2016  . Diabetic polyneuropathy associated with type 2 diabetes mellitus ([place]) 06/10/2016  . Vision changes 06/02/2016  . Myalgia and myositis 03/31/2016  . Shortness of breath 02/09/2016  . Chronic gouty arthritis 11/29/2015  . LBP (low back pain) 11/27/2015  . Arthralgia of multiple joints 11/27/2015  . 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[place]) 11/27/2015  . Breast pain in male 11/14/2015  . Sinusitis, chronic 10/16/2015  . Insomnia 10/16/2015  . New onset of headaches after age 47 09/20/2015  . OSA (obstructive sleep apnea) 07/12/2015  . Low serum testosterone level 05/18/2015  . Depression 05/16/2015  . Morbid obesity due to excess calories ([place])   . Chronic pain   . Type 2 diabetes mellitus with stage 3 chronic kidney disease, without long-term current use of insulin ([place])   . CAD (coronary artery disease) 12/24/2014  . Atrial fibrillation ([place]) 12/24/2014  . Essential hypertension 12/24/2014  . Chronic kidney disease 12/24/2014  . PUD (peptic ulcer disease) 12/24/2014     Current Outpatient Medications on File Prior to Visit  Medication Sig Dispense Refill  . Accu-Chek FastClix Lancets MISC Use one strip to monitor glucose levels BID; E11.42 102 each  11  . ACCU-CHEK GUIDE test strip USE 1 STRIP TWICE DAILY TO CHECK BLOOD GLUCOSE 100 strip 7  . albuterol (PROVENTIL HFA;VENTOLIN HFA) 108 (90 Base) MCG/ACT inhaler Inhale 2 puffs into the lungs every 6 (six) hours as needed for wheezing or shortness of breath. 1 Inhaler 0  . allopurinol (ZYLOPRIM) 100 MG tablet Take 200 mg by mouth every evening.     [name] atorvastatin (LIPITOR) 40 MG tablet Take 1 tablet (40 mg total) by mouth daily. 90 tablet 2  . carvedilol (COREG) 12.5 MG tablet Take 1 tablet (12.5 mg total) by mouth 2 (two) times daily with a meal. 180 tablet 1  . diclofenac Sodium (VOLTAREN) 1 % GEL Apply 4 g topically 4 (four) times daily. 100 g 0  . DULoxetine (CYMBALTA) 20 MG capsule TAKE 1 CAPSULE BY MOUTH ONCE DAILY (Patient taking differently: Take 20 mg by mouth daily. ) 90 capsule 0  . EQ ASPIRIN ADULT LOW DOSE 81 MG EC tablet TAKE 1 TABLET BY MOUTH ONCE DAILY. SWALLOW WHOLE (Patient taking differently: Take 81 mg by mouth at bedtime. ) 90 tablet 0  .  famotidine (PEPCID) 20 MG tablet TAKE 1 TABLET BY MOUTH TWO TIMES DAILY (Patient taking differently: Take 20 mg by mouth 2 (two) times daily. ) 60 tablet 2  . ferrous sulfate 325 (65 FE) MG tablet Take 1 tablet (325 mg total) by mouth 2 (two) times daily with a meal. 60 tablet 0  . fluticasone (FLONASE) 50 MCG/ACT nasal spray Place 2 sprays into both nostrils daily. 16 g 6  . Insulin Lispro Prot & Lispro (HUMALOG MIX 75/25 KWIKPEN) (75-25) 100 UNIT/ML Kwikpen Inject 100 Units into the skin daily with breakfast. 20 pen 11  . Insulin Pen Needle (PEN NEEDLES) 31G X 6 MM MISC 1 each by Does not apply route daily. E11.9 90 each 0  . isosorbide mononitrate (IMDUR) 30 MG 24 hr tablet Take 30 mg by mouth daily.    [name] oxyCODONE (OXY IR/ROXICODONE) 5 MG immediate release tablet Take 5 mg by mouth 4 (four) times daily as needed.    . polyethylene glycol (MIRALAX / GLYCOLAX) 17 g packet Take 17 g by mouth daily as needed for moderate constipation or severe  constipation. 14 each 0  . potassium chloride (KLOR-CON) 10 MEQ tablet Take 10 mEq by mouth daily.    . potassium chloride SA (KLOR-CON) 10 MEQ tablet Take 1 tablet (10 mEq total) by mouth daily for 7 days. 7 tablet 0  . predniSONE (DELTASONE) 5 MG tablet Take 5 mg by mouth daily.    . pregabalin (LYRICA) 100 MG capsule Take 100 mg by mouth 3 (three) times daily.     . Secukinumab (COSENTYX) 150 MG/ML SOSY Inject 150 mg into the skin every 28 (twenty-eight) days. Hold until told to resume by PCP 1.96 mL   . Tafamidis (VYNDAMAX) 61 MG CAPS Take 61 mg by mouth daily. 30 capsule 11  . traMADol (ULTRAM) 50 MG tablet Take 1 tablet (50 mg total) by mouth every 6 (six) hours as needed for moderate pain or severe pain. 20 tablet 0   No current facility-administered medications on file prior to visit.    Allergies  Allergen Reactions  . Nsaids Other (See Comments)    Stomach pains. Ulcers - stated by patient     Social History   Socioeconomic History  . Marital status: Married    Spouse name: Amethyst  . Number of children: 8  . Years of education: 36  . Highest education level: Some college, no degree  Occupational History    Comment: disabled  Tobacco Use  . Smoking status: Former Smoker    Packs/day: 0.50    Years: 10.00    Pack years: 5.00    Types: Cigarettes    Quit date: 04/11/2008    Years since quitting: 11.8  . Smokeless tobacco: Never Used  Vaping Use  . Vaping Use: Never used  Substance and Sexual Activity  . Alcohol use: No    Alcohol/week: 0.0 standard drinks  . Drug use: No  . Sexual activity: Yes    Partners: Female    Birth control/protection: None  Other Topics Concern  . Not on file  Social History Narrative   Lives with wife. Does not work.  On disability.    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 Provided crisis assistance program information to assist with  bills       Has issues getting food- gets $36/month in food stamps- already has list of food pantries but has not tried them- encouraged pt to try food pantries and reach out to clinic for help if needed   Social Determinants of Health   Financial Resource Strain:   . Difficulty of Paying Living Expenses:   Food Insecurity:   . Worried About Charity fundraiser in the Last Year:   . Arboriculturist in the Last Year:   Transportation Needs:   . Film/video editor (Medical):   [name] Lack of Transportation (Non-Medical):   Physical Activity:   . Days of Exercise per Week:   . Minutes of Exercise per Session:   Stress:   . Feeling of Stress :   Social Connections:   . Frequency of Communication with Friends and Family:   . Frequency of Social Gatherings with Friends and Family:   . Attends Religious Services:   . Active Member of Clubs or Organizations:   . Attends Archivist Meetings:   [name] Marital Status:   Intimate Partner Violence:   . Fear of Current or Ex-Partner:   . Emotionally Abused:   [name] Physically Abused:   . Sexually Abused:     Family History  Problem Relation Age of Onset  . Hypertension Mother   . Diabetes Mother   . COPD Mother   . Lung cancer Mother        Smoker   . Hypertension Father   . Diabetes Father   . Heart Problems Father   . COPD Father   . Colon cancer Neg Hx   . Stomach cancer Neg Hx     Past Surgical History:  Procedure Laterality Date  . CARDIAC CATHETERIZATION  05/2014   ablation for atrial fibrillation  . CARDIAC CATHETERIZATION N/A 11/16/2015   Procedure: Left Heart Cath and Coronary Angiography;  Surgeon: Leonie Man, MD;  Location: [facility];  Service: Cardiovascular;  Laterality: N/A;  . COLON RESECTION  09/2013   due to large, abnormal polpy. non cancerous per patient.   . COLON SURGERY  09/2014   colon resection   . IR PERC  PLEURAL DRAIN W/INDWELL CATH W/IMG GUIDE  01/07/2020  . LEFT HEART CATH AND CORONARY ANGIOGRAPHY N/A 05/19/2018   Procedure: LEFT HEART CATH AND CORONARY ANGIOGRAPHY;  Surgeon: Wellington Hampshire, MD;  Location: [facility];  Service: Cardiovascular;  Laterality: N/A;    ROS: Review of Systems Negative except as stated above  PHYSICAL EXAM: BP (!) 144/79   Pulse 75   Temp 99.3 F (37.4 C)   Resp 16   Wt 229 lb 3.2 oz (104 kg)   SpO2 92%   BMI 36.99 kg/m   Wt Readings from Last 3 Encounters:  02/16/20 229 lb 3.2 oz (104 kg)  02/10/20 230 lb (104.3 kg)  02/02/20 229 lb 9.6 oz (104.1 kg)    Physical Exam  General appearance - alert, frail-appearing older African-American male in NAD Mental status - normal mood, behavior, speech, dress, motor activity, and thought processes Mouth - mucous membranes moist, pharynx normal without lesions Neck - supple, no significant adenopathy Chest -soft crackles right base. Heart - normal rate, regular rhythm, normal S1, S2, no murmurs, rubs, clicks or gallops Musculoskeletal -patient ambulates with walker.  Fine tremors in both hands.   Extremities -trace to 1+ edema in both lower extremities Lab Results  Component Value  Date   HGBA1C 8.4 (H) 12/08/2019    CMP Latest Ref Rng & Units 02/16/2020 01/13/2020 01/12/2020  Glucose 65 - 99 mg/dL 166(H) 103(H) 95  BUN 8 - 27 mg/dL 17 12 15   Creatinine 0.76 - 1.27 mg/dL 1.29(H) 1.42(H) 1.32(H)  Sodium 134 - 144 mmol/L 143 139 138  Potassium 3.5 - 5.2 mmol/L 4.2 4.1 4.3  Chloride 96 - 106 mmol/L 97 102 103  CO2 20 - 29 mmol/L 30(H) 28 27  Calcium 8.6 - 10.2 mg/dL 9.3 8.2(L) 8.1(L)  Total Protein 6.0 - 8.5 g/dL 8.1 6.6 -  Total Bilirubin 0.0 - 1.2 mg/dL 0.2 0.5 -  Alkaline Phos 48 - 121 IU/L 75 72 -  AST 0 - 40 IU/L 9 25 -  ALT 0 - 44 IU/L 14 65(H) -   Lipid Panel     Component Value Date/Time   CHOL 126 04/22/2019 1121   TRIG 148 01/03/2020 0444   HDL 32 (L) 04/22/2019 1121   CHOLHDL 3.9  04/22/2019 1121   CHOLHDL 2.6 06/10/2016 1253   VLDL 18 06/10/2016 1253   LDLCALC 70 04/22/2019 1121    CBC    Component Value Date/Time   WBC 13.0 (H) 02/16/2020 1218   WBC 12.2 (H) 01/13/2020 0855   RBC 4.10 (L) 02/16/2020 1218   RBC 2.81 (L) 01/13/2020 0855   HGB 12.1 (L) 02/16/2020 1218   HCT 37.2 (L) 02/16/2020 1218   PLT 357 02/16/2020 1218   MCV 91 02/16/2020 1218   MCH 29.5 02/16/2020 1218   MCH 28.5 01/13/2020 0855   MCHC 32.5 02/16/2020 1218   MCHC 31.4 01/13/2020 0855   RDW 17.7 (H) 02/16/2020 1218   LYMPHSABS 2.2 01/13/2020 0855   LYMPHSABS 1.8 10/20/2018 1631   MONOABS 1.5 (H) 01/13/2020 0855   EOSABS 0.3 01/13/2020 0855   EOSABS 0.1 10/20/2018 1631   BASOSABS 0.1 01/13/2020 0855   BASOSABS 0.1 10/20/2018 1631    ASSESSMENT AND PLAN:  1. Hospital discharge follow-up   2. Type 2 diabetes mellitus with circulatory complication with long-term current use of insulin (HCC) Continue current dose of insulin.  Reported home blood sugars are good. - POCT glucose (manual entry) - CBC - Comprehensive metabolic panel - Ambulatory referral to Home Health  3. Essential hypertension Not at goal but patient has not taken all of his medicines as yet for today.  Continue low-salt diet.  Continue carvedilol and isosorbide.  We will leave it up to cardiology as to whether to restart the [facility]. - Ambulatory referral to [facility]  4. Chronic combined systolic and diastolic CHF, NYHA class 2 ([place]) See #3 above He does have some swelling in legs so I recommend increase Furosemide to 60 mg a.m and 40 mg p.m - furosemide (LASIX) 40 MG tablet; 1.5 tab Po Q a.m and 40 mg PO Q p.m  Dispense: 30 tablet; Refill: 0 - Ambulatory referral to Home Health  5. Vitamin B12 deficiency - cyanocobalamin 1000 MCG tablet; Take 1 tablet (1,000 mcg total) by mouth daily.  Dispense: 100 tablet; Refill: 1  6. OSA on CPAP Advised pt that daytime sleepiness likely due to combination of OSA  and use of narcotic (Oxycodone) and Ambien.  Recommend that he cut back on Oxycodone from TID to BID dosing.    7. Anemia in stage 2 chronic kidney disease Advised patient to take the iron at least once a day and use his MiraLAX to help prevent constipation  8. History of recent  fall Advised patient to go slow with position changes and to use the walker.  Will refer him for home physical therapy and home health nurse - Ambulatory referral to [facility]  9. Drug-induced constipation See #7 above  10. Chronic narcotic use Advised patient and wife that he should have a prescription for Narcan at home for wife to use on him if needed in the event of suspected narcotic overdose or change in mental status due to narcotic use. - naloxone (NARCAN) nasal spray 4 mg/0.1 mL; Place in nostril in the event of suspected overdose on Narcotic medication (Oxycodone)  Dispense: 1 each; Refill: 0  11. Insomnia, unspecified type - zolpidem (AMBIEN CR) 6.25 MG CR tablet; TAKE 1 TABLET BY MOUTH EVERY DAY AT BEDTIME AS NEEDED FOR SLEEP.  [place] or after 02/23/2020  Dispense: 30 tablet; Refill: 0  Advised patient to speak with his rheumatologist about Cosentyx to inquire whether it should still be continued. At the end of the visit, patient sent message with my CMA to ask what a my card to do about his back pain.  I told her to let them know this will need to be taken upon her next visit.  Patient was given the opportunity to ask questions.  Patient verbalized understanding of the plan and was able to repeat key elements of the plan.   Orders Placed This Encounter  Procedures  . CBC  . Comprehensive metabolic panel  . Ambulatory referral to Home Health  . POCT glucose (manual entry)     Requested Prescriptions   Signed Prescriptions Disp Refills  . furosemide (LASIX) 40 MG tablet 30 tablet 0    Sig: 1.5 tab Po Q a.m and 40 mg PO Q p.m  . cyanocobalamin 1000 MCG tablet 100 tablet 1    Sig: Take 1  tablet (1,000 mcg total) by mouth daily.  . naloxone (NARCAN) nasal spray 4 mg/0.1 mL 1 each 0    Sig: Place in nostril in the event of suspected overdose on Narcotic medication (Oxycodone)  . zolpidem (AMBIEN CR) 6.25 MG CR tablet 30 tablet 0    Sig: TAKE 1 TABLET BY MOUTH EVERY DAY AT BEDTIME AS NEEDED FOR SLEEP.  [place] or after 02/23/2020    Return in about 2 months (around 04/18/2020).  Karle Plumber, MD, FACP

## 2020-02-16 NOTE — Patient Instructions (Signed)
Increase furosemide to 60 mg in the morning and 40 mg at night.  I will refer you for some home physical therapy.  Try to decrease the oxycodone to twice a day and avoid taking at nights.  Let your rheumatologist know that you were recently in the hospital in the intensive care unit with a serious infection in the kidney that needed to be drained.  Find out whether you should continue the Cosentyx.  Start vitamin B12 5000 mcg daily.  Restart the iron supplement daily.  Take the MiraLAX daily as long as you are on the iron.  If you still get constipated, please let me know so that we can try something stronger.

## 2020-02-17 LAB — COMPREHENSIVE METABOLIC PANEL
ALT: 14 IU/L (ref 0–44)
AST: 9 IU/L (ref 0–40)
Albumin/Globulin Ratio: 1 — ABNORMAL LOW (ref 1.2–2.2)
Albumin: 4 g/dL (ref 3.8–4.8)
Alkaline Phosphatase: 75 IU/L (ref 48–121)
BUN/Creatinine Ratio: 13 (ref 10–24)
BUN: 17 mg/dL (ref 8–27)
Bilirubin Total: 0.2 mg/dL (ref 0.0–1.2)
CO2: 30 mmol/L — ABNORMAL HIGH (ref 20–29)
Calcium: 9.3 mg/dL (ref 8.6–10.2)
Chloride: 97 mmol/L (ref 96–106)
Creatinine, Ser: 1.29 mg/dL — ABNORMAL HIGH (ref 0.76–1.27)
GFR calc Af Amer: 67 mL/min/{1.73_m2} (ref >59)
GFR calc non Af Amer: 58 mL/min/{1.73_m2} — ABNORMAL LOW (ref >59)
Globulin, Total: 4.1 g/dL (ref 1.5–4.5)
Glucose: 166 mg/dL — ABNORMAL HIGH (ref 65–99)
Potassium: 4.2 mmol/L (ref 3.5–5.2)
Sodium: 143 mmol/L (ref 134–144)
Total Protein: 8.1 g/dL (ref 6.0–8.5)

## 2020-02-17 LAB — CBC
Hematocrit: 37.2 % — ABNORMAL LOW (ref 37.5–51.0)
Hemoglobin: 12.1 g/dL — ABNORMAL LOW (ref 13.0–17.7)
MCH: 29.5 pg (ref 26.6–33.0)
MCHC: 32.5 g/dL (ref 31.5–35.7)
MCV: 91 fL (ref 79–97)
Platelets: 357 10*3/uL (ref 150–450)
RBC: 4.1 x10E6/uL — ABNORMAL LOW (ref 4.14–5.80)
RDW: 17.7 % — ABNORMAL HIGH (ref 11.6–15.4)
WBC: 13 10*3/uL — ABNORMAL HIGH (ref 3.4–10.8)

## 2020-02-19 DIAGNOSIS — E538 Deficiency of other specified B group vitamins: Secondary | ICD-10-CM | POA: Insufficient documentation

## 2020-02-19 MED ORDER — ZOLPIDEM TARTRATE ER 6.25 MG PO TBCR: EXTENDED_RELEASE_TABLET | ORAL | 0 refills | Status: DC

## 2020-02-21 ENCOUNTER — Ambulatory Visit: Payer: Self-pay

## 2020-02-21 NOTE — Telephone Encounter (Signed)
Pt c/o worsening bilateral lower leg edema. Pt stated swelling Had OV and increased edema. Per OV notes increased diuretic to 60 mg in am 40 mg in pm - both legs are sore when standing for prolonged periods. Right leg swelling is worse than left leg. Edema extends to above knees.  Pt stated he is taking 120 mg of Lasix  in the am and 80 mg Lasix  in the pm. Asked pt to look at dosage of furosemide in bottle and wife stated they were 80 mg pills. Pt advised that per OV notes the dose was to be 60 mg in the am and 40 mg at night.  Denies, SOB, chest pain or difficulty breathing.  Pt stated he would like a call back rather than come in for evaluation.   Reason for Disposition . [1] MODERATE leg swelling (e.g., swelling extends up to knees) AND [2] new-onset or worsening  Answer Assessment - Initial Assessment Questions 1. ONSET: "When did the swelling start?" (e.g., minutes, hours, days)     Last 2 days  2. LOCATION: "What part of the leg is swollen?"  "Are both legs swollen or just one leg?"     *No Answer* 3. SEVERITY: "How bad is the swelling?" (e.g., localized; mild, moderate, severe)  - Localized - small area of swelling localized to one leg  - MILD pedal edema - swelling limited to foot and ankle, pitting edema < 1/4 inch (6 mm) deep, rest and elevation eliminate most or all swelling  - MODERATE edema - swelling of lower leg to knee, pitting edema > 1/4 inch (6 mm) deep, rest and elevation only partially reduce swelling  - SEVERE edema - swelling extends above knee, facial or hand swelling present      *No Answer* 4. REDNESS: "Does the swelling look red or infected?"     no 5. PAIN: "Is the swelling painful to touch?" If Yes, ask: "How painful is it?"   (Scale 1-10; mild, moderate or severe)     *No Answer* 6. FEVER: "Do you have a fever?" If Yes, ask: "What is it, how was it measured, and when did it start?"      no 7. CAUSE: "What do you think is causing the leg swelling?"     *No  Answer* 8. MEDICAL HISTORY: "Do you have a history of heart failure, kidney disease, liver failure, or cancer?"     *No Answer* 9. RECURRENT SYMPTOM: "Have you had leg swelling before?" If Yes, ask: "When was the last time?" "What happened that time?"     *No Answer* 10. OTHER SYMPTOMS: "Do you have any other symptoms?" (e.g., chest pain, difficulty breathing)       no 11. PREGNANCY: "Is there any chance you are pregnant?" "When was your last menstrual period?"       *No Answer*  Protocols used: LEG SWELLING AND EDEMA-A-AH

## 2020-02-22 ENCOUNTER — Telehealth: Payer: Self-pay

## 2020-02-22 ENCOUNTER — Other Ambulatory Visit: Payer: Self-pay | Admitting: Internal Medicine

## 2020-02-22 DIAGNOSIS — G4700 Insomnia, unspecified: Secondary | ICD-10-CM

## 2020-02-22 NOTE — Telephone Encounter (Signed)
Requested medication (s) are due for refill today: No  Requested medication (s) are on the active medication list: Yes  Last refill:  02/19/20  Future visit scheduled: Yes  Notes to clinic:  PA is needed, patient is out of medication and would like the refill today, unable to refill per protocol     Requested Prescriptions  Pending Prescriptions Disp Refills   zolpidem (AMBIEN CR) 6.25 MG CR tablet 30 tablet 0    Sig: TAKE 1 TABLET BY MOUTH EVERY DAY AT BEDTIME AS NEEDED FOR SLEEP.  [place] or after 02/23/2020      Not Delegated - Psychiatry:  Anxiolytics/Hypnotics Failed - 02/22/2020  4:24 PM      Failed - This refill cannot be delegated      Passed - Urine Drug Screen completed in last 360 days.      Passed - Valid encounter within last 6 months    Recent Outpatient Visits           6 days ago Hospital discharge follow-up   [name], MD   2 months ago Hospital discharge follow-up   [name], MD   5 months ago Type 2 diabetes mellitus with other circulatory complication, with long-term current use of insulin [facility])   [name], MD   8 months ago Myoclonic jerking   [name], MD   9 months ago Uncontrolled type 2 diabetes mellitus with diabetic neuropathy [name])   [name], MD       Future Appointments             In 2 months [name], MD [place]

## 2020-02-22 NOTE — Telephone Encounter (Signed)
I reviewed the note from the nurse [facility].  Patient seen last week Thursday.  He did not have his medication bottles with him.  They told me that he was taking Lasix 40 mg twice a day.  I had increased it to 60 mg in the morning and 40 mg in the evening.   -Phone call placed to patient this a.m.  I was unable to reach the patient or his spouse via phone.  I left a voicemail message letting him know that if he is taking Lasix 120 mg in the morning and 80 mg in the evening and still having significant swelling in his legs, he should be seen in the ER as he may have decompensated CHF.  I will have my case worker contact [name] to get him in for f/u with heart failure clinic.

## 2020-02-22 NOTE — Telephone Encounter (Signed)
PA approved 09/2019 for 6 months. Should still be active. Of note, Dr. Wynetta Emery requested that he not fill this medication until 02/23/2020.

## 2020-02-22 NOTE — Telephone Encounter (Signed)
Medication Refill - Medication: zolpidem (AMBIEN CR) 6.25 MG CR tablet  Pt is completely out of his supply, he states that a PA is needed. Saw PCP on 02/19/2020  Has the patient contacted their pharmacy? Yes.   (Agent: If no, request that the patient contact the pharmacy for the refill.) (Agent: If yes, when and what did the pharmacy advise?)  Preferred Pharmacy (with phone number or street name):  [id number] [address], [place]  [place] [address]  Phone: [phone] Fax: [phone]     Agent: Please be advised that RX refills may take up to 3 business days. We ask that you follow-up with your pharmacy.

## 2020-02-22 NOTE — Telephone Encounter (Signed)
Referral received for home health services. Call placed to patient to inquire if he has a preference for home health agencies and he had no preference.  Referral faxed to [facility] - attention Gerda Diss, for review.   He is requesting PCS. Informed him that Dr Wynetta Emery would be notified of his request.

## 2020-02-23 ENCOUNTER — Encounter (HOSPITAL_COMMUNITY): Payer: Medicaid Other

## 2020-02-23 ENCOUNTER — Telehealth: Payer: Self-pay | Admitting: Internal Medicine

## 2020-02-23 ENCOUNTER — Telehealth: Payer: Self-pay

## 2020-02-23 NOTE — Telephone Encounter (Signed)
Please follow up  Copied from [place] [id number]. Topic: General - Other >> Feb 22, 2020  4:14 PM Marco [name] wrote: Reason for CRM: Patient called to inform Dr Wynetta Emery that he is completely out of the zolpidem (AMBIEN CR) 6.25 MG CR tablet he states that he was told that it need [name] prior authorization. Asking if something can be done today please

## 2020-02-23 NOTE — Telephone Encounter (Signed)
Patient is calling back to speak to [place]. Patient did not understand the message. No did he see a vm on his phone. CB- [phone]

## 2020-02-23 NOTE — Telephone Encounter (Signed)
Attempted to contact patient # [phone], he answered but could not hear me speaking.  Called again and message was left requesting a call back to this CM.   Want to inform him that a PCS referral has been placed, [facility] will be contacting him to schedule home health visits and to remind him to bring all of his medications with him to future vistits with Dr Wynetta Emery and the cardiologist.   He has an appointment at [facility] 03/05/2020.

## 2020-02-23 NOTE — Telephone Encounter (Signed)
Marco [name] could you work on this please

## 2020-02-23 NOTE — Telephone Encounter (Signed)
PCS referral faxed to [facility]

## 2020-02-23 NOTE — Telephone Encounter (Signed)
Call received from [facility] who confirmed that they would accept the referral and plan to see him next week.   They will contact the patient.

## 2020-02-24 ENCOUNTER — Telehealth: Payer: Self-pay | Admitting: Internal Medicine

## 2020-02-24 ENCOUNTER — Telehealth: Payer: Self-pay

## 2020-02-24 NOTE — Telephone Encounter (Signed)
Zolpidem prior auth denied-formulary changed for pt's ins.  Pt must have tried one of the following; Belsomra or Ramelteon.  If appropriate can MD change?

## 2020-02-24 NOTE — Telephone Encounter (Signed)
PA is not due to be renewed until Aug.  Pt may be requesting refill early?

## 2020-02-24 NOTE — Telephone Encounter (Signed)
Elvina Sidle is faxing me the rejection, I will see what can be done.

## 2020-02-24 NOTE — Telephone Encounter (Signed)
Called pt unable to reach/ Left voice message to call back. Name and phone nr provided.

## 2020-02-24 NOTE — Telephone Encounter (Signed)
Patient is calling back to speak to [facility] regarding [place] Referrals [phone]

## 2020-02-26 MED ORDER — RAMELTEON 8 MG PO TABS: 8.0000 mg | ORAL_TABLET | Freq: Every day | ORAL | 1 refills | Status: DC

## 2020-02-26 NOTE — Addendum Note (Signed)
Addended by: Karle Plumber B on: 02/26/2020 09:10 AM   Modules accepted: Orders

## 2020-02-27 MED FILL — RAMELTEON 8 MG TABS: 8 | 30 days supply | Qty: 30 | Fill #0

## 2020-02-27 MED FILL — VYNDAMAX 61 MG CAPS: 61 | 30 days supply | Qty: 30 | Fill #5

## 2020-02-27 NOTE — Telephone Encounter (Signed)
Left voicemail for patient 11:17am.

## 2020-02-27 NOTE — Telephone Encounter (Signed)
Call placed to patient.  Informed him that the home health referral has been made to [facility] and they wil be contacting him to schedule a home visit.  Regarding PCS , he said that he has been contacted by [employer].  This CM reminded him to make sure that he brings all of his medications with him to his appointments with Dr Wynetta Emery and with cardiology and he said that he understood.

## 2020-02-29 ENCOUNTER — Other Ambulatory Visit: Payer: Self-pay | Admitting: Internal Medicine

## 2020-02-29 ENCOUNTER — Telehealth: Payer: Self-pay | Admitting: Endocrinology

## 2020-02-29 DIAGNOSIS — I2510 Atherosclerotic heart disease of native coronary artery without angina pectoris: Secondary | ICD-10-CM

## 2020-02-29 DIAGNOSIS — Z794 Long term (current) use of insulin: Secondary | ICD-10-CM

## 2020-02-29 MED ORDER — PEN NEEDLES 31G X 6 MM MISC: 1.0000 | Freq: Every day | 2 refills | Status: DC

## 2020-02-29 MED ORDER — ASPIRIN 81 MG PO TBEC: DELAYED_RELEASE_TABLET | ORAL | 0 refills | Status: DC

## 2020-02-29 NOTE — Telephone Encounter (Signed)
Refill sent to requested pharmacy.

## 2020-02-29 NOTE — Telephone Encounter (Signed)
Medication Refill Request  Did you call your pharmacy and request this refill first? Yes  . If patient has not contacted pharmacy first, instruct them to do so for future refills.  . Remind them that contacting the pharmacy for their refill is the quickest method to get the refill.  . Refill policy also stated that it will take anywhere between 24-72 hours to receive the refill.    Name of medication? Insulin pen needles 31G X 8mm  Is this a 90 day supply? Same as last time  Name and location of pharmacy?  [facility], [address] [place], [place] [address]  Phone:  [phone] Fax:  [phone]

## 2020-02-29 NOTE — Telephone Encounter (Signed)
Medication Refill - Medication: EQ ASPIRIN ADULT LOW DOSE 81 MG EC tablet (Patient is completely out of medication)   Has the patient contacted their pharmacy?yes (Agent: If no, request that the patient contact the pharmacy for the refill.) (Agent: If yes, when and what did the pharmacy advise?)contact PCP  Preferred Pharmacy (with phone number or street name):  [place], [place] Phone:  [phone]  Fax:  [phone]       Agent: Please be advised that RX refills may take up to 3 business days. We ask that you follow-up with your pharmacy.

## 2020-03-02 ENCOUNTER — Other Ambulatory Visit: Payer: Self-pay | Admitting: Endocrinology

## 2020-03-02 DIAGNOSIS — E1159 Type 2 diabetes mellitus with other circulatory complications: Secondary | ICD-10-CM

## 2020-03-02 DIAGNOSIS — Z794 Long term (current) use of insulin: Secondary | ICD-10-CM

## 2020-03-02 MED ORDER — PEN NEEDLES 31G X 6 MM MISC: 1.0000 | Freq: Every day | 2 refills | Status: DC

## 2020-03-02 MED FILL — UNIFINE PENTIPS 6MM 31G: 31G X 6 MM | 90 days supply | Qty: 100 | Fill #0

## 2020-03-02 NOTE — Telephone Encounter (Signed)
Refill sent in

## 2020-03-02 NOTE — Telephone Encounter (Signed)
Medication Refill Request  . Did you call your pharmacy and request this refill first?  Yes - wrong RX sent 02/29/20  . If patient has not contacted pharmacy first, instruct them to do so for future refills.  . Remind them that contacting the pharmacy for their refill is the quickest method to get the refill.  . Refill policy also stated that it will take anywhere between 24-72 hours to receive the refill.    Name of medication? BD Nano 2nd Generation Pen Needles 4MM x 32G  Is this a 90 day supply?  Yes  Name and location of pharmacy? [facility]

## 2020-03-04 NOTE — Progress Notes (Signed)
Advanced Heart Failure Clin Note    PCP: Marco Pier, MD PCP-Cardiologist: Marco Him, MD  HF: Marco [name]   HPI: Marco [name] is a 64 y.o. 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  He was seen by Marco [name] in 03/2018 for SOB and found to have a drop in EF to 35-40% with inferior hypokinesis. Cardiac cath 05/19/18 showed mild nonobstructive CAD  LVEDP 28, EF 25-30%.   PYP 11/19  (grade 2, H/CLL equal 1.94) are strongly suggestive of transthyretin amyloidosis. However on my review, did not seem so positive. Myleloma panel with MGUS, discussed with Marco [name] who recommend follow up labwork for common, non-pathologic findings. Did not think he had myeloma  Repeat PYP 12/20 H/CLL = 1.10  The study is equivocal. The study is equivocal for TTR amyloidosis (visual score of 1 or H/CL ratio 1-1.5).   Echo 2/20 EF 40-45% RV normal   Echo 3/21 EF 55-60%   Admitted 12/08/19-12/13/19 for urosepsis and AKI with creatinine 1.5 -> 5.6. He was again admitted 12/27/2019 with pyelonephritis with infected cyst. Hospital course complicated by respiratory failure.   Today he returns for HF follow up. Complaining of back pain. SOB with exertion but this is his baseline.  Says dyspnea has been unpredictable. Denies PND/Orthopnea. Appetite ok. No fever or chills. Weight at home 230-235  pounds. Taking all medications.   Studies:  [facility] 11/2015 showed minimal CAD, mild calcification noted extraluminally, normal LVF, normal LVEDP. CPX 06/2016 with limited interpretation due to submaximal effort  during exercise, difficult to interpret, also of note was very intolerant to discomfort from several processes during the CPX procedure today (i.e. motion on bike, electrode removal, etc).  - Echo 09/03/16 EF 45-50% with global HK, grade 2 DD, elevated LV filling pressure. CTA 09/02/16 was negative for PE.  - Nuclear stress test 12/2016 was done for atypical CP showing small defect of severe severity in apical septal and apex location, no ischemia, EF 44% - suspected artifact.  - Echo 01/20/17 EF 50-55%, hypokinesis of apical inferoseptal myocardium, grade 1 diastolic dysfunction, mild MR, no sig change from prior.  - Event monitor 12/2016 showed NSR with occasional PVCs, otherwise benign.  - ETT 02/2017 was nondiagnostic due to 20 seconds of exercise and severe SOB. -Cardiac cath 05/19/18 showed mild nonobstructive CAD (see below), mod-severely elevated LVEDP 28, EF 25-30%. - Echo 05/19/18 LVEF 25-30%, Grade 1 DD, Trivial MR.  - PYP 11/19 Visual and quantitative assessment (grade 2, H/CLL equal 1.94) are strongly suggestive of transthyretin amyloidosis.  LHC 05/19/18  - Ost 2nd Diag lesion is 35% stenosed.  Prox LAD to Mid LAD lesion is 20% stenosed.  Ost Cx to Prox Cx lesion is 10% stenosed.  There is severe left ventricular systolic dysfunction.  LV end diastolic pressure is moderately elevated. The left ventricular ejection fraction is 25-35% by visual estimate. - LVEDP 28  CPX 06/19/2016 - Limited due to submaximal effort and poor amount of information.  FVC 2.35 (65%)    FEV1 1.94 (66%)     FEV1/FVC 82 (102%)     MVV 117 (86%) Post-Exercise PFTs--------- Not  performed due to sudden termination of exercise Exercise Time:  1:56      Watts: 10 RPE: 17 Resting HR: 70 Peak HR: 84  (50% age predicted max HR) BP rest: 148/82 BP peak: 156/84 Peak VO2: 9.7 (50% predicted peak VO2) VE/VCO2 slope: 30 OUES: 1.34 Peak RER: 0.97 Ventilatory Threshold: CANNOT BE  DETERMINED Peak RR 78 Peak Ventilation: 28.8 VE/MVV: 25% PETCO2 at peak: 36 O2pulse: 11  (79% predicted O2pulse)   Review of systems complete and found to be negative unless listed in HPI.    Past Medical History:  Diagnosis Date  . Benign colon polyp 08/01/2013   [facility] in [place]. large base tranverse colon polyp was biopsied. polyp was benign with minimal surface hyperplastic change.  . Chronic combined systolic and diastolic CHF (congestive heart failure) ([place])   . Chronic pain   . CKD (chronic kidney disease), stage III   . Diabetes mellitus without complication ([place])   . Diverticulosis   . Esophageal hiatal hernia 07/29/2013   confirmed on EGD   . Esophageal stricture   . Essential hypertension   . Gastritis 07/29/2013   confirmed on EGD, bx done an negative for intestinal metaplasia, dsyplasia or H. pylori. normal gastric emptying study done 07/13/2013.  [name] GERD (gastroesophageal reflux disease)   . Morbid obesity ([place])   . Non-obstructive CAD    a. 02/2013 Cath ([place]): nonobs dzs;  b. 09/2014 Myoview ([place]): EF 55%, no ischemia;  c. Cath 05/2018 mild CAD no obstruction.  . Persistent atrial fibrillation ([place])    a. 02/2013 s/p rfca in [place], [place]-->prev on Xarelto, d/c'd 2/2 anemia, ? GIB.  [name] Rheumatoid arthritis ([place])   . Sigmoid diverticulosis 08/01/2013   confirmed on colonscopy. record scanned into chart    Current Outpatient Medications  Medication Sig Dispense Refill  . Accu-Chek FastClix Lancets MISC Use one strip to monitor glucose levels BID; E11.42 102 each 11  . ACCU-CHEK GUIDE test strip USE 1 STRIP TWICE DAILY TO CHECK BLOOD GLUCOSE 100 strip 7  . albuterol (PROVENTIL HFA;VENTOLIN HFA) 108 (90 Base) MCG/ACT inhaler Inhale 2 puffs into the lungs every 6 (six) hours as needed for wheezing or shortness of breath. 1 Inhaler 0  . allopurinol (ZYLOPRIM) 100 MG tablet Take 200 mg by mouth every evening.     [name] aspirin (EQ ASPIRIN ADULT LOW  DOSE) 81 MG EC tablet TAKE 1 TABLET BY MOUTH ONCE DAILY. SWALLOW WHOLE 90 tablet 0  . atorvastatin (LIPITOR) 40 MG tablet Take 1 tablet (40 mg total) by mouth daily. 90 tablet 2  . carvedilol (COREG) 12.5 MG tablet Take 1 tablet (12.5 mg total) by mouth 2 (two) times daily with a meal. 180 tablet 1  . diclofenac Sodium (VOLTAREN) 1 % GEL Apply 4 g topically 4 (four) times daily. 100 g 0  . DULoxetine (CYMBALTA) 20 MG capsule TAKE 1 CAPSULE BY MOUTH ONCE DAILY (Patient taking differently: Take 20 mg by mouth daily. ) 90 capsule 0  . famotidine (PEPCID) 20 MG tablet TAKE 1 TABLET BY MOUTH TWO TIMES DAILY (Patient taking differently: Take 20 mg by mouth 2 (two) times daily. ) 60 tablet 2  . ferrous sulfate 325 (65 FE) MG tablet Take 1 tablet (325 mg total) by mouth 2 (two) times daily with a meal. 60 tablet 0  . furosemide (LASIX) 80 MG tablet Take 40 mg by mouth 2 (two) times daily.    . Insulin Lispro Prot & Lispro (HUMALOG MIX  75/25 KWIKPEN) (75-25) 100 UNIT/ML Kwikpen Inject 100 Units into the skin daily with breakfast. 20 pen 11  . Insulin Pen Needle (PEN NEEDLES) 31G X 6 MM MISC 1 each by Does not apply route daily. E11.9 90 each 2  . isosorbide mononitrate (IMDUR) 30 MG 24 hr tablet Take 30 mg by mouth daily.    . naloxone (NARCAN) nasal spray 4 mg/0.1 mL Place in nostril in the event of suspected overdose on Narcotic medication (Oxycodone) 1 each 0  . oxyCODONE (OXY IR/ROXICODONE) 5 MG immediate release tablet Take 5 mg by mouth 4 (four) times daily as needed.    . polyethylene glycol (MIRALAX / GLYCOLAX) 17 g packet Take 17 g by mouth daily as needed for moderate constipation or severe constipation. 14 each 0  . potassium chloride (KLOR-CON) 10 MEQ tablet Take 10 mEq by mouth daily.    . predniSONE (DELTASONE) 5 MG tablet Take 5 mg by mouth daily.    . pregabalin (LYRICA) 100 MG capsule Take 100 mg by mouth 3 (three) times daily.     . ramelteon (ROZEREM) 8 MG tablet Take 1 tablet (8 mg total)  by mouth at bedtime. 30 tablet 1  . Secukinumab (COSENTYX) 150 MG/ML SOSY Inject 150 mg into the skin every 28 (twenty-eight) days. Hold until told to resume by PCP 1.96 mL   . Tafamidis (VYNDAMAX) 61 MG CAPS Take 61 mg by mouth daily. 30 capsule 11   No current facility-administered medications for this encounter.    Allergies  Allergen Reactions  . Nsaids Other (See Comments)    Stomach pains. Ulcers - stated by patient   . Jardiance [Empagliflozin] Nausea And Vomiting      Social History   Socioeconomic History  . Marital status: Married    Spouse name: Amethyst  . Number of children: 8  . Years of education: 45  . Highest education level: Some college, no degree  Occupational History    Comment: disabled  Tobacco Use  . Smoking status: Former Smoker    Packs/day: 0.50    Years: 10.00    Pack years: 5.00    Types: Cigarettes    Quit date: 04/11/2008    Years since quitting: 11.9  . Smokeless tobacco: Never Used  Vaping Use  . Vaping Use: Never used  Substance and Sexual Activity  . Alcohol use: No    Alcohol/week: 0.0 standard drinks  . Drug use: No  . Sexual activity: Yes    Partners: Female    Birth control/protection: None  Other Topics Concern  . Not on file  Social History Narrative   Lives with wife. Does not work.  On disability.    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 Provided crisis assistance program information to assist with bills       Has issues getting food- gets $36/month in food stamps- already has list of food pantries but has not tried them- encouraged pt to try food pantries and reach out to clinic for help if needed   Social Determinants of Health   Financial Resource Strain:   . Difficulty of Paying Living Expenses:   Food Insecurity:   . Worried About  Charity fundraiser in the Last Year:   . [employer]  in the Last Year:   Transportation Needs:   . Film/video editor (Medical):   [name] Lack of Transportation (Non-Medical):   Physical Activity:   . Days of Exercise per Week:   . Minutes of Exercise per Session:   Stress:   . Feeling of Stress :   Social Connections:   . Frequency of Communication with Friends and Family:   . Frequency of Social Gatherings with Friends and Family:   . Attends Religious Services:   . Active Member of Clubs or Organizations:   . Attends Archivist Meetings:   [name] Marital Status:   Intimate Partner Violence:   . Fear of Current or Ex-Partner:   . Emotionally Abused:   [name] Physically Abused:   . Sexually Abused:       Family History  Problem Relation Age of Onset  . Hypertension Mother   . Diabetes Mother   . COPD Mother   . Lung cancer Mother        Smoker   . Hypertension Father   . Diabetes Father   . Heart Problems Father   . COPD Father   . Colon cancer Neg Hx   . Stomach cancer Neg Hx     Vitals:   03/05/20 1414  BP: (!) 134/82  Pulse: 74  SpO2: 93%  Weight: (!) 106.2 kg    Wt Readings from Last 3 Encounters:  03/05/20 (!) 106.2 kg  02/16/20 104 kg  02/10/20 104.3 kg     PHYSICAL EXAM: General:  Arrived in a wheel chair. No resp difficulty HEENT: normal Neck: supple. no JVD. Carotids 2+ bilat; no bruits. No lymphadenopathy or thryomegaly appreciated. Cor: PMI nondisplaced. Regular rate & rhythm. No rubs, gallops or murmurs. Lungs: clear Abdomen: soft, nontender, nondistended. No hepatosplenomegaly. No bruits or masses. Good bowel sounds. Extremities: no cyanosis, clubbing, rash, edema Neuro: alert & orientedx3, cranial nerves grossly intact. moves all 4 extremities w/o difficulty. Affect pleasant     ASSESSMENT & PLAN:  1. Chronic combined systolic and diastolic CHF  - Echo 16/1/09 LVEF 25-30%, Grade 1 DD, Trivial MR.  - Echo 2/20 40-45%  RV ok. -  Echo 3/21 EF 55-60% -> complete recovery - ECHO 12/2019 EF 50-55% RV normal.  - Very mild non-obstructive CAD on cath 05/19/18,  NICM  - PYP scan 11/19 read as + for Cardiac amyloidosis. But when I reviewed I am not 604% certain it is positive. Genetic testing negative. Myeloma Panel with MGUS, no myeloma. Now on Tafamadis.  - Repeat PYP 1/21. Read as equivocal and fet to be negative.  - Ideally, would get cMRI, but limited due to CKD and claustrophobia.  - NYHA III. Volume status stable. Continue lasix 40 mg twice a day.  - Off entresto with recent sepsis/hypotension. .  - Continue coreg to 12.5 mg BID  - Off hydralazine - Continue imdur 30 mg daily.    - Off Jardiance due to intolerance with N/V. Refuses to rechallenge. 2. Cardiac Amyloidosis - By PYP scan 06/29/18. Grade 2, H/CLL equal 1.94. See discussion above - Genetic testing negative - Repeat PYP 1/21. Reviewed personally. Read as equivocal. I feel it is negative. - Has subsequently been started on tafamadis. May be able to stop 3. Nonobstructive CAD  - By cath 05/2018 as above - No chestpain.  -Continue ASA . Dr Radford [name] managing lipids 4. CKD stage III Reviewed BMET from 02/16/2020 5. History of atrial fibrillation  - s/p RFCA 2017. - Previously  on Xarelto, stopped due to anemia and unknown blood loss.  - CHA2DS2/VASc is at least 4. 6. DM2 - Per PCP 7. OSA Continue CPAP nightly  Follow up in 2 months with Dr Haroldine [name].    Darrick Grinder, NP 03/05/20

## 2020-03-05 ENCOUNTER — Other Ambulatory Visit: Payer: Self-pay

## 2020-03-05 ENCOUNTER — Ambulatory Visit (HOSPITAL_COMMUNITY)
Admission: RE | Admit: 2020-03-05 | Discharge: 2020-03-05 | Disposition: A | Discharge status: Home / Self Care | Payer: Medicare Other | Source: Ambulatory Visit | Attending: Internal Medicine | Admitting: Internal Medicine

## 2020-03-05 ENCOUNTER — Encounter (HOSPITAL_COMMUNITY): Payer: Self-pay

## 2020-03-05 VITALS — BP 134/82 | HR 74 | Wt 234.2 lb

## 2020-03-05 DIAGNOSIS — Z7982 Long term (current) use of aspirin: Secondary | ICD-10-CM | POA: Diagnosis not present

## 2020-03-05 DIAGNOSIS — N1831 Chronic kidney disease, stage 3a: Secondary | ICD-10-CM | POA: Diagnosis not present

## 2020-03-05 DIAGNOSIS — I4891 Unspecified atrial fibrillation: Secondary | ICD-10-CM | POA: Diagnosis not present

## 2020-03-05 DIAGNOSIS — G4733 Obstructive sleep apnea (adult) (pediatric): Secondary | ICD-10-CM | POA: Insufficient documentation

## 2020-03-05 DIAGNOSIS — D472 Monoclonal gammopathy: Secondary | ICD-10-CM | POA: Insufficient documentation

## 2020-03-05 DIAGNOSIS — M069 Rheumatoid arthritis, unspecified: Secondary | ICD-10-CM | POA: Diagnosis not present

## 2020-03-05 DIAGNOSIS — Z794 Long term (current) use of insulin: Secondary | ICD-10-CM | POA: Insufficient documentation

## 2020-03-05 DIAGNOSIS — Z7952 Long term (current) use of systemic steroids: Secondary | ICD-10-CM | POA: Diagnosis not present

## 2020-03-05 DIAGNOSIS — I5022 Chronic systolic (congestive) heart failure: Secondary | ICD-10-CM | POA: Diagnosis not present

## 2020-03-05 DIAGNOSIS — K219 Gastro-esophageal reflux disease without esophagitis: Secondary | ICD-10-CM | POA: Insufficient documentation

## 2020-03-05 DIAGNOSIS — I43 Cardiomyopathy in diseases classified elsewhere: Secondary | ICD-10-CM | POA: Diagnosis not present

## 2020-03-05 DIAGNOSIS — Z8249 Family history of ischemic heart disease and other diseases of the circulatory system: Secondary | ICD-10-CM | POA: Insufficient documentation

## 2020-03-05 DIAGNOSIS — I5042 Chronic combined systolic (congestive) and diastolic (congestive) heart failure: Secondary | ICD-10-CM | POA: Insufficient documentation

## 2020-03-05 DIAGNOSIS — I10 Essential (primary) hypertension: Secondary | ICD-10-CM

## 2020-03-05 DIAGNOSIS — C9110 Chronic lymphocytic leukemia of B-cell type not having achieved remission: Secondary | ICD-10-CM | POA: Insufficient documentation

## 2020-03-05 DIAGNOSIS — Z87891 Personal history of nicotine dependence: Secondary | ICD-10-CM | POA: Insufficient documentation

## 2020-03-05 DIAGNOSIS — I428 Other cardiomyopathies: Secondary | ICD-10-CM

## 2020-03-05 DIAGNOSIS — N183 Chronic kidney disease, stage 3 unspecified: Secondary | ICD-10-CM | POA: Insufficient documentation

## 2020-03-05 DIAGNOSIS — Z79899 Other long term (current) drug therapy: Secondary | ICD-10-CM | POA: Diagnosis not present

## 2020-03-05 DIAGNOSIS — I2510 Atherosclerotic heart disease of native coronary artery without angina pectoris: Secondary | ICD-10-CM

## 2020-03-05 DIAGNOSIS — E1122 Type 2 diabetes mellitus with diabetic chronic kidney disease: Secondary | ICD-10-CM | POA: Insufficient documentation

## 2020-03-05 DIAGNOSIS — I130 Hypertensive heart and chronic kidney disease with heart failure and stage 1 through stage 4 chronic kidney disease, or unspecified chronic kidney disease: Secondary | ICD-10-CM | POA: Insufficient documentation

## 2020-03-05 NOTE — Patient Instructions (Signed)
It was great to see you today! No medication changes are needed at this time.   Your physician recommends that you schedule a follow-up appointment in: 2 months  Do the following things EVERYDAY: 1) Weigh yourself in the morning before breakfast. Write it down and keep it in a log. 2) Take your medicines as prescribed 3) Eat low salt foods--Limit salt (sodium) to 2000 mg per day.  4) Stay as active as you can everyday 5) Limit all fluids for the day to less than 2 liters

## 2020-03-06 ENCOUNTER — Other Ambulatory Visit: Payer: Self-pay

## 2020-03-06 ENCOUNTER — Other Ambulatory Visit: Payer: Self-pay | Admitting: Endocrinology

## 2020-03-06 DIAGNOSIS — E1159 Type 2 diabetes mellitus with other circulatory complications: Secondary | ICD-10-CM

## 2020-03-06 MED ORDER — INSULIN PEN NEEDLE 32G X 4 MM MISC: 1.0000 | Freq: Every day | 0 refills | Status: DC

## 2020-03-12 MED FILL — UNIFINE PENTIPS 32GX5/32: 32G X 4 MM | 90 days supply | Qty: 100 | Fill #0

## 2020-03-15 ENCOUNTER — Telehealth: Payer: Self-pay

## 2020-03-15 ENCOUNTER — Telehealth: Payer: Self-pay | Admitting: Internal Medicine

## 2020-03-15 NOTE — Telephone Encounter (Signed)
Please advise.  Copied from [place] [phone]. Topic: General - Other >> Mar 12, 2020  3:48 PM Rainey Pines A wrote: Patient is requesting Dr. Wynetta Emery prescribe medication so that he can get an MRI done on 8/12. Please advise

## 2020-03-15 NOTE — Telephone Encounter (Signed)
Contacted pt . Pt states he is claustrophobic. Pt would like rx sent to [facility]

## 2020-03-15 NOTE — Telephone Encounter (Signed)
Call placed to [facility] regarding status of PCS referral.  Spoke to [place] who stated that the patient was assessed and did not qualify for services because he only needs very limited assistance with ADLs.  They will be sending him a letter  with the option to appeal the denial

## 2020-03-16 MED ORDER — LORAZEPAM 0.5 MG PO TABS: 0.5000 mg | ORAL_TABLET | Freq: Once | ORAL | 0 refills | Status: AC

## 2020-03-16 MED FILL — LORazepam 0.5 MG TABS: 0.5 | 1 days supply | Qty: 1 | Fill #0

## 2020-03-16 NOTE — Telephone Encounter (Signed)
Done

## 2020-03-16 NOTE — Telephone Encounter (Signed)
Contacted pt and left a detailed vm informing pt that rx has been sent to [facility] and if he has any questions or concerns to give a call

## 2020-03-19 MED FILL — FAMOTIDINE 20 MG TABLET: 20 | 30 days supply | Qty: 60 | Fill #2

## 2020-03-19 MED FILL — ALLOPURINOL 100 MG TABS: 100 | 30 days supply | Qty: 60 | Fill #4

## 2020-03-19 MED FILL — predniSONE 5 MG TABS: 5 | 30 days supply | Qty: 30 | Fill #4

## 2020-03-19 MED FILL — PREGABALIN 100 MG CAPS: 100 | 30 days supply | Qty: 90 | Fill #4

## 2020-03-20 MED FILL — CARVEDILOL 12.5 MG TABLET: 12.5 | 90 days supply | Qty: 180 | Fill #1

## 2020-03-20 MED FILL — POTASSIUM CHLORIDE CRYS ER: 20 | 90 days supply | Qty: 90 | Fill #2

## 2020-03-21 MED FILL — RAMELTEON 8 MG TABS: 8 | 30 days supply | Qty: 30 | Fill #1

## 2020-03-22 ENCOUNTER — Ambulatory Visit
Admission: RE | Admit: 2020-03-22 | Discharge: 2020-03-22 | Disposition: A | Discharge status: Home / Self Care | Payer: Medicare Other | Source: Ambulatory Visit | Attending: Internal Medicine | Admitting: Internal Medicine

## 2020-03-22 ENCOUNTER — Other Ambulatory Visit: Payer: Self-pay

## 2020-03-22 DIAGNOSIS — N2889 Other specified disorders of kidney and ureter: Secondary | ICD-10-CM

## 2020-03-22 MED ORDER — GADOBENATE DIMEGLUMINE 529 MG/ML IV SOLN
15.0000 mL | Freq: Once | INTRAVENOUS | Status: AC | PRN
  Administered 2020-03-22: 15 mL via INTRAVENOUS

## 2020-03-26 ENCOUNTER — Other Ambulatory Visit: Payer: Self-pay | Admitting: Internal Medicine

## 2020-03-26 ENCOUNTER — Other Ambulatory Visit: Payer: Self-pay | Admitting: Endocrinology

## 2020-03-26 DIAGNOSIS — N2889 Other specified disorders of kidney and ureter: Secondary | ICD-10-CM

## 2020-03-26 DIAGNOSIS — E11649 Type 2 diabetes mellitus with hypoglycemia without coma: Secondary | ICD-10-CM

## 2020-03-27 ENCOUNTER — Other Ambulatory Visit: Payer: Self-pay

## 2020-03-27 ENCOUNTER — Other Ambulatory Visit: Payer: Self-pay | Admitting: Internal Medicine

## 2020-03-27 ENCOUNTER — Telehealth: Payer: Self-pay | Admitting: Endocrinology

## 2020-03-27 DIAGNOSIS — Z794 Long term (current) use of insulin: Secondary | ICD-10-CM

## 2020-03-27 DIAGNOSIS — E118 Type 2 diabetes mellitus with unspecified complications: Secondary | ICD-10-CM

## 2020-03-27 MED ORDER — ACCU-CHEK FASTCLIX LANCETS MISC: 1.0000 | Freq: Two times a day (BID) | 0 refills | Status: DC

## 2020-03-27 MED FILL — DULOXETINE HCL 20 MG CPEP: 20 | 90 days supply | Qty: 90 | Fill #0

## 2020-03-27 MED FILL — ACCU-CHEK FASTCLIX LANCETS: 51 days supply | Qty: 102 | Fill #0

## 2020-03-27 MED FILL — ACCU-CHEK GUIDE TEST STRIP: 50 days supply | Qty: 100 | Fill #1

## 2020-03-27 MED FILL — ATORVASTATIN CALCIUM 40 MG: 40 | 90 days supply | Qty: 90 | Fill #2

## 2020-03-27 MED FILL — FUROSEMIDE 80 MG TAB: 80 | 30 days supply | Qty: 60 | Fill #2

## 2020-03-27 NOTE — Telephone Encounter (Signed)
Outpatient Medication Detail   Disp Refills Start End   Accu-Chek FastClix Lancets MISC 204 each 0 03/27/2020    Sig - Route: 1 each by Other route 2 (two) times daily. E11.42 - Other   Sent to pharmacy as: Accu-Chek FastClix Lancets Misc   E-Prescribing Status: Receipt confirmed by pharmacy (03/27/2020  3:26 PM EDT)

## 2020-03-27 NOTE — Telephone Encounter (Signed)
Medication Refill Request  . Did you call your pharmacy and request this refill first?pharmacy called themselves  . If patient has not contacted pharmacy first, instruct them to do so for future refills.  . Remind them that contacting the pharmacy for their refill is the quickest method to get the refill.  . Refill policy also stated that it will take anywhere between 24-72 hours to receive the refill.    Name of medication? Accu check Fast Click Lancets  Is this a 90 day supply? Yes  Name and location of pharmacy? [facility]

## 2020-03-28 MED FILL — VYNDAMAX 61 MG CAPS: 61 | 30 days supply | Qty: 30 | Fill #6

## 2020-04-10 ENCOUNTER — Telehealth: Payer: Self-pay | Admitting: Internal Medicine

## 2020-04-10 DIAGNOSIS — Z9181 History of falling: Secondary | ICD-10-CM

## 2020-04-10 DIAGNOSIS — Z7409 Other reduced mobility: Secondary | ICD-10-CM

## 2020-04-10 NOTE — Telephone Encounter (Signed)
Please look at referral note date 12/28/19

## 2020-04-10 NOTE — Telephone Encounter (Signed)
I looked back at the patents chart and it looks like he was referred out to physical therapy from the hospital. Please f/u    Copied from [place] [phone]. Topic: General - Other >> Apr 09, 2020  3:31 PM Leward Quan A wrote: Reason for CRM: Patient called to inquire of Dr Wynetta Emery about the Physical Therapy that he was to have been referred. States that its been months and he have yet to hear anything and is still in a lot of pain. Please call Ph# [phone]

## 2020-04-11 ENCOUNTER — Encounter: Payer: Self-pay | Admitting: Internal Medicine

## 2020-04-11 NOTE — Telephone Encounter (Signed)
Referral placed again for home P.T through [facility].

## 2020-04-17 ENCOUNTER — Telehealth: Payer: Self-pay

## 2020-04-17 NOTE — Telephone Encounter (Signed)
This CM spoke to the patient who stated that home PT with [facility] started 04/15/2020

## 2020-04-18 ENCOUNTER — Other Ambulatory Visit: Payer: Self-pay | Admitting: Internal Medicine

## 2020-04-18 MED FILL — RAMELTEON 8 MG TABS: 8 | 30 days supply | Qty: 30 | Fill #0

## 2020-04-18 MED FILL — FAMOTIDINE 20 MG TABLET: 20 | 30 days supply | Qty: 60 | Fill #0

## 2020-04-19 MED FILL — PREGABALIN 100 MG CAPS: 100 | 30 days supply | Qty: 90 | Fill #0

## 2020-04-19 MED FILL — ALLOPURINOL 100 MG TABS: 100 | 30 days supply | Qty: 60 | Fill #5

## 2020-04-19 MED FILL — predniSONE 5 MG TABS: 5 | 30 days supply | Qty: 30 | Fill #5

## 2020-04-23 MED FILL — VYNDAMAX 61 MG CAPS: 61 | 30 days supply | Qty: 30 | Fill #7

## 2020-04-29 NOTE — Assessment & Plan Note (Signed)
Benefits when he can use CPAP. Plan- continue VPAP auto 14/11, ask [employer] to service machine

## 2020-04-29 NOTE — Assessment & Plan Note (Signed)
Airflow seems ok on exam today. Plan- f/u w [facility] as planned

## 2020-05-03 ENCOUNTER — Telehealth: Payer: Self-pay

## 2020-05-03 NOTE — Telephone Encounter (Signed)
Calls placed to [facility] # [phone] x 2 to clarify the order for the power chair.  Messages left at option #1 Marco [name]) and option # 2 requesting a call back to this CM # [phone]

## 2020-05-09 ENCOUNTER — Telehealth: Payer: Self-pay | Admitting: Internal Medicine

## 2020-05-09 NOTE — Telephone Encounter (Signed)
Please advise if possible 

## 2020-05-09 NOTE — Telephone Encounter (Signed)
Copied from CRM #[id number]. Topic: General - Other >> May 08, 2020  4:21 PM Davis, Karen A wrote: Reason for CRM: Patient called to request a letter from Dr Johnson stating that he need to have an assistant driving with him because he is not capable of going places by himself. Please call patient for further information at Ph# [phone] 

## 2020-05-09 NOTE — Telephone Encounter (Signed)
Call placed to [facility], spoke to [place] who explained that they just need the order for the power chair to be signed and they will send off to the DME company along with the documentation from their visit notes.  No other information from Dr Wynetta Emery is needed at time

## 2020-05-11 ENCOUNTER — Other Ambulatory Visit: Payer: Self-pay

## 2020-05-11 ENCOUNTER — Other Ambulatory Visit (HOSPITAL_COMMUNITY): Payer: Self-pay | Admitting: Internal Medicine

## 2020-05-11 ENCOUNTER — Ambulatory Visit (HOSPITAL_COMMUNITY)
Admission: RE | Admit: 2020-05-11 | Discharge: 2020-05-11 | Disposition: A | Discharge status: Home / Self Care | Payer: Medicare Other | Source: Ambulatory Visit | Attending: Internal Medicine | Admitting: Internal Medicine

## 2020-05-11 ENCOUNTER — Encounter (HOSPITAL_COMMUNITY): Payer: Self-pay

## 2020-05-11 DIAGNOSIS — I2510 Atherosclerotic heart disease of native coronary artery without angina pectoris: Secondary | ICD-10-CM | POA: Diagnosis not present

## 2020-05-11 DIAGNOSIS — I5022 Chronic systolic (congestive) heart failure: Secondary | ICD-10-CM | POA: Diagnosis not present

## 2020-05-11 DIAGNOSIS — I480 Paroxysmal atrial fibrillation: Secondary | ICD-10-CM

## 2020-05-11 MED ORDER — ENTRESTO 24-26 MG PO TABS: 1.0000 | ORAL_TABLET | Freq: Two times a day (BID) | ORAL | 4 refills | Status: DC

## 2020-05-11 MED FILL — ENTRESTO 24 MG-26 MG TABLET: 24-26 | 30 days supply | Qty: 60 | Fill #0

## 2020-05-11 NOTE — Addendum Note (Signed)
Encounter addended by: Malena Edman, RN on: 05/11/2020 3:37 PM  Actions taken: Pharmacy for encounter modified, Order list changed, Diagnosis association updated, Clinical Note Signed

## 2020-05-11 NOTE — Patient Instructions (Addendum)
RESTART Entresto 24/26mg  Twice daily. This is the lower dose  Your physician has requested that you have an echocardiogram with your 4 month follow up. Echocardiography is a painless test that uses sound waves to create images of your heart. It provides your doctor with information about the size and shape of your heart and how well your heart's chambers and valves are working. This procedure takes approximately one hour. There are no restrictions for this procedure.   We will contact you to schedule your follow up.  If you have any questions or concerns before your next appointment please send [place] a message through [place] or call our office at [phone].    TO LEAVE A MESSAGE FOR THE NURSE SELECT OPTION 2, PLEASE LEAVE A MESSAGE INCLUDING: . YOUR NAME . DATE OF BIRTH . CALL BACK NUMBER . REASON FOR CALL**this is important as we prioritize the call backs  YOU WILL RECEIVE A CALL BACK THE SAME DAY AS LONG AS YOU CALL BEFORE 4:00 PM

## 2020-05-11 NOTE — Telephone Encounter (Signed)
Unable to reach or leave VM

## 2020-05-11 NOTE — Progress Notes (Signed)
Heart Failure TeleHealth Note  Due to national recommendations of social distancing due to [place] 19, Audio/video telehealth visit is felt to be most appropriate for this patient at this time.  See MyChart message from today for patient consent regarding telehealth for [facility]. The patient was identified personally using two identifiers.   Date:  05/11/2020   ID:  Marco [name], DOB 1955/09/03, MRN [id number]  Location: Home  Provider location: [facility] Type of Visit: Established patient  PCP:  [name], MD  Cardiologist:  [name], MD Primary HF: Deatra Mcmahen  Chief Complaint: Heart Failure follow-up   History of Present Illness:  Marco [name] is a 64 y.o. 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  He was seen by [name] in 03/2018 for SOB and found to have a drop in EF to 35-40% with inferior hypokinesis. Cardiac cath 05/19/18 showed mild nonobstructive CAD  LVEDP 28, EF 25-30%.   PYP 11/19  (grade 2, H/CLL equal 1.94) are strongly suggestive of transthyretin amyloidosis. However on my review, did not seem so positive. Myleloma panel with MGUS, discussed with [name] who recommend follow up labwork for common, non-pathologic findings. Did not think he had myeloma  Repeat PYP 12/20 H/CLL = 1.10 The study is equivocal for TTR amyloidosis (visual score of 1 or H/CL ratio 1-1.5).   Echo 2/20 EF 40-45% RV normal   Echo 3/21 EF 55-60%   Admitted 12/08/19-12/13/19 for urosepsis and AKI with creatinine 1.5 -> 5.6. Meds  held. Hydrated and creatinine 1.7 on d/c.   Readmitted 5/18-01/14/20 for pyelonephritis. Entresto stopped  He presents via [employer], civil (consulting) for a telehealth visit today in the setting of the COVID-19 pandemic. Says he is doing ok. Main issues are leg and back pain. No edema, orthopnea or PND. No CP. Breathing improved. Only SOB up steps or carrying things.    Studies:   [facility] 11/2015 showed minimal CAD, mild calcification noted extraluminally, normal LVF, normal LVEDP. CPX 06/2016 with limited interpretation due to submaximal effort during exercise, difficult to interpret, also of note was very intolerant to discomfort from several processes during the CPX procedure today (i.e.motion on bike, electrode removal, etc).  - Echo 09/03/16 EF 45-50% with global HK, grade 2 DD, elevated LV filling pressure. CTA 09/02/16 was negative for PE.  - Nuclear stress test 12/2016 was done for atypical CP showing small defect of severe severity in apical septal and apex location, no ischemia, EF 44% - suspected artifact.  - Echo 01/20/17 EF 50-55%, hypokinesis of apical inferoseptal myocardium, grade 1 diastolic dysfunction, mild MR, no sig change from prior.  - Event monitor 12/2016 showed NSR with occasional PVCs, otherwise benign.  - ETT 02/2017 was nondiagnostic due to 20 seconds of exercise and severe SOB. -Cardiac cath 05/19/18 showed mild nonobstructive CAD (see below), mod-severely elevated LVEDP 28, EF 25-30%. - Echo 05/19/18 LVEF 25-30%, Grade 1 DD, Trivial MR.  - PYP 11/19 Visual and quantitative assessment (grade 2, H/CLL equal 1.94) are strongly suggestive of transthyretin amyloidosis.  LHC 05/19/18  - Ost 2nd Diag lesion is 35% stenosed.  Prox LAD to Mid LAD lesion is 20% stenosed.  Ost Cx to Prox Cx lesion is 10% stenosed.  There is severe left ventricular systolic dysfunction.  LV end diastolic pressure is moderately elevated. The left ventricular ejection fraction is 25-35% by visual  estimate. - LVEDP 28  CPX 06/19/2016 - Limited due to submaximal effort and poor amount of information.  FVC 2.35 (65%)    FEV1 1.94 (66%)     FEV1/FVC 82 (102%)     MVV 117 (86%) Post-Exercise PFTs--------- Not performed due to sudden termination of exercise Exercise Time:  1:56      Watts: 10 RPE: 17 Resting HR: 70 Peak HR: 84  (50% age predicted max HR) BP rest: 148/82 BP peak: 156/84 Peak VO2: 9.7 (50% predicted peak VO2) VE/VCO2 slope: 30 OUES: 1.34 Peak RER: 0.97 Ventilatory Threshold: CANNOT BE DETERMINED Peak RR 78 Peak Ventilation: 28.8 VE/MVV: 25% PETCO2 at peak: 36 O2pulse: 11  (79% predicted O2pulse)   Marco [name] denies symptoms worrisome for COVID 19.   Past Medical History:  Diagnosis Date  . Benign colon polyp 08/01/2013   [facility] in [place]. large base tranverse colon polyp was biopsied. polyp was benign with minimal surface hyperplastic change.  . Chronic combined systolic and diastolic CHF (congestive heart failure) ([place])   . Chronic pain   . CKD (chronic kidney disease), stage III   . Diabetes mellitus without complication ([place])   . Diverticulosis   . Esophageal hiatal hernia 07/29/2013   confirmed on EGD   . Esophageal stricture   . Essential hypertension   . Gastritis 07/29/2013   confirmed on EGD, bx done an negative for intestinal metaplasia, dsyplasia or H. pylori. normal gastric emptying study done 07/13/2013.  [name] GERD (gastroesophageal reflux disease)   . Morbid obesity ([place])   . Non-obstructive CAD    a. 02/2013 Cath ([place]): nonobs dzs;  b. 09/2014 Myoview ([place]): EF 55%, no ischemia;  c. Cath 05/2018 mild CAD no obstruction.  . Persistent atrial fibrillation ([place])    a. 02/2013 s/p rfca in [place], [place]-->prev on Xarelto, d/c'd 2/2 anemia, ? GIB.  [name] Rheumatoid arthritis ([place])   . Sigmoid diverticulosis 08/01/2013   confirmed on colonscopy. record scanned into chart   Past Surgical History:    Procedure Laterality Date  . CARDIAC CATHETERIZATION  05/2014   ablation for atrial fibrillation  . CARDIAC CATHETERIZATION N/A 11/16/2015   Procedure: Left Heart Cath and Coronary Angiography;  Surgeon: Leonie Man, MD;  Location: [facility];  Service: Cardiovascular;  Laterality: N/A;  . COLON RESECTION  09/2013   due to large, abnormal polpy. non cancerous per patient.   . COLON SURGERY  09/2014   colon resection   . IR PERC PLEURAL DRAIN W/INDWELL CATH W/IMG GUIDE  01/07/2020  . LEFT HEART CATH AND CORONARY ANGIOGRAPHY N/A 05/19/2018   Procedure: LEFT HEART CATH AND CORONARY ANGIOGRAPHY;  Surgeon: Wellington Hampshire, MD;  Location: [facility];  Service: Cardiovascular;  Laterality: N/A;     Current Outpatient Medications  Medication Sig Dispense Refill  . Accu-Chek FastClix Lancets MISC 1 each by Other route 2 (two) times daily. E11.42 204 each 0  . ACCU-CHEK GUIDE test strip USE 1 STRIP TWICE DAILY TO CHECK BLOOD GLUCOSE 100 strip 7  . albuterol (PROVENTIL HFA;VENTOLIN HFA) 108 (90 Base) MCG/ACT inhaler Inhale 2 puffs into the lungs every 6 (six) hours as needed for wheezing or shortness of breath.  1 Inhaler 0  . allopurinol (ZYLOPRIM) 100 MG tablet Take 200 mg by mouth every evening.     [name] aspirin (EQ ASPIRIN ADULT LOW DOSE) 81 MG EC tablet TAKE 1 TABLET BY MOUTH ONCE DAILY. SWALLOW WHOLE 90 tablet 0  . atorvastatin (LIPITOR) 40 MG tablet Take 1 tablet (40 mg total) by mouth daily. 90 tablet 2  . carvedilol (COREG) 12.5 MG tablet Take 1 tablet (12.5 mg total) by mouth 2 (two) times daily with a meal. 180 tablet 1  . diclofenac Sodium (VOLTAREN) 1 % GEL Apply 4 g topically 4 (four) times daily. 100 g 0  . DULoxetine (CYMBALTA) 20 MG capsule TAKE 1 CAPSULE BY MOUTH ONCE DAILY 90 capsule 0  . famotidine (PEPCID) 20 MG tablet TAKE 1 TABLET BY MOUTH TWO TIMES DAILY 60 tablet 2  . ferrous sulfate 325 (65 FE) MG tablet Take 1 tablet (325 mg total) by mouth 2 (two) times  daily with a meal. 60 tablet 0  . furosemide (LASIX) 80 MG tablet Take 40 mg by mouth 2 (two) times daily.    . Insulin Lispro Prot & Lispro (HUMALOG MIX 75/25 KWIKPEN) (75-25) 100 UNIT/ML Kwikpen Inject 100 Units into the skin daily with breakfast. 20 pen 11  . Insulin Pen Needle 32G X 4 MM MISC 1 each by Does not apply route daily. E11.9 100 each 0  . isosorbide mononitrate (IMDUR) 30 MG 24 hr tablet Take 30 mg by mouth daily.    . naloxone (NARCAN) nasal spray 4 mg/0.1 mL Place in nostril in the event of suspected overdose on Narcotic medication (Oxycodone) 1 each 0  . oxyCODONE (OXY IR/ROXICODONE) 5 MG immediate release tablet Take 5 mg by mouth 4 (four) times daily as needed.    . polyethylene glycol (MIRALAX / GLYCOLAX) 17 g packet Take 17 g by mouth daily as needed for moderate constipation or severe constipation. 14 each 0  . potassium chloride (KLOR-CON) 10 MEQ tablet Take 10 mEq by mouth daily.    . predniSONE (DELTASONE) 5 MG tablet Take 5 mg by mouth daily.    . pregabalin (LYRICA) 100 MG capsule Take 100 mg by mouth 3 (three) times daily.     . ramelteon (ROZEREM) 8 MG tablet TAKE 1 TABLET BY MOUTH AT BEDTIME 30 tablet 1  . Secukinumab (COSENTYX) 150 MG/ML SOSY Inject 150 mg into the skin every 28 (twenty-eight) days. Hold until told to resume by PCP 1.96 mL   . Tafamidis (VYNDAMAX) 61 MG CAPS Take 61 mg by mouth daily. 30 capsule 11   No current facility-administered medications for this encounter.    Allergies:   Nsaids and Jardiance [empagliflozin]   Social History:  The patient  reports that he quit smoking about 12 years ago. His smoking use included cigarettes. He has a 5.00 pack-year smoking history. He has never used smokeless tobacco. He reports that he does not drink alcohol and does not use drugs.   Family History:  The patient's family history includes COPD in his father and mother; Diabetes in his father and mother; Heart Problems in his father; Hypertension in his  father and mother; Lung cancer in his mother.   ROS:  Please see the history of present illness.   All other systems are personally reviewed and negative.   Exam:  (Video/Tele Health Call; Exam is subjective and or/visual.) General:  Speaks in full sentences. No resp difficulty. Lungs: Normal respiratory effort with conversation.  Abdomen: Non-distended per patient report  Extremities: Pt denies edema. Neuro: Alert & oriented x 3.   Recent Labs: 12/09/2019: TSH 1.102 01/01/2020: B Natriuretic Peptide 497.6 01/12/2020: Magnesium 1.7 02/16/2020: ALT 14; BUN 17; Creatinine, Ser 1.29; Hemoglobin 12.1; Platelets 357; Potassium 4.2; Sodium 143  Personally reviewed   Wt Readings from Last 3 Encounters:  03/05/20 (!) 106.2 kg (234 lb 3.2 oz)  02/16/20 104 kg (229 lb 3.2 oz)  02/10/20 104.3 kg (230 lb)      ASSESSMENT AND PLAN:  1. Chronic combined systolic and diastolic CHF - Echo 80/9/98 LVEF 25-30%, Grade 1 DD, Trivial MR.  - Echo 2/20 40-45%  RV ok. No need for ICD currently.  - Echo 3/21 EF 55-60% -> complete recovery - Very mild non-obstructive CAD on cath 05/19/18,  NICM  - Improved NYHA II symptoms   - Volume status ok - PYP scan 11/19 read as + for Cardiac amyloidosis. But when I reviewed I am not 338% certain it is positive. Genetic testing negative. Myeloma Panel with MGUS, no myeloma. Now on Tafamadis.  - Repeat PYP 1/21. Reviewed personally. Read as equivocal. I feel it is negative. - Ideally, would get cMRI, but limited due to CKD and claustrophobia.  - Continue lasix 80 daily - Off Entresto due to sepsis. Restart Entresto 24/26 bid - Continue coreg to 12.5 mg BID  - Off hydralazine/imdur due to low BP - Off Jardiance - Repeat echo in 4 months - Reinforced fluid restriction to < 2 L daily, sodium restriction to less than 2000 mg daily, and the importance of daily weights.   2. Cardiac Amyloidosis - By PYP scan 06/29/18. Grade 2, H/CLL equal 1.94. See discussion above -  Genetic testing negative - Repeat PYP 1/21. Reviewed personally. Read as equivocal. I feel it is negative. - Has subsequently been started on tafamadis. May be able to stop 3. Nonobstructive CAD - By cath 05/2018 as above - No s/s angina - Continue ASA . Dr [name] managing lipids 4. CKD stage III - Creatinine down 1.7 -> 1.3 on labs 7/21 5. History of atrial fibrillation - s/p RFCA 2017. No recurrence - Previously on Xarelto, stopped due to anemia and unknown blood loss. - CHA2DS2/VASc is at least 4. 6. DM2 - Per PCP - Off Jardiance  COVID screen The patient does not have any symptoms that suggest any further testing/ screening at this time.  Social distancing reinforced today.  Recommended follow-up:  As above  Relevant cardiac medications were reviewed at length with the patient today.   The patient does not have concerns regarding their medications at this time.   The following changes were made today:  As above  Today, I have spent 17 minutes with the patient with telehealth technology discussing the above issues .    Signed, Glori Bickers, MD  05/11/2020 2:30 PM  [facility] [place] [address] [facility] [place] [address] [phone] (office) [phone] (fax)

## 2020-05-12 ENCOUNTER — Other Ambulatory Visit: Payer: Self-pay

## 2020-05-12 ENCOUNTER — Inpatient Hospital Stay (HOSPITAL_COMMUNITY)
Admission: EM | Admit: 2020-05-12 | Discharge: 2020-05-24 | DRG: 871 | Disposition: A | Discharge status: Home / Self Care | Payer: Medicare Other | Attending: Internal Medicine | Admitting: Internal Medicine

## 2020-05-12 DIAGNOSIS — R7989 Other specified abnormal findings of blood chemistry: Secondary | ICD-10-CM

## 2020-05-12 DIAGNOSIS — N136 Pyonephrosis: Secondary | ICD-10-CM | POA: Diagnosis present

## 2020-05-12 DIAGNOSIS — Z794 Long term (current) use of insulin: Secondary | ICD-10-CM | POA: Diagnosis present

## 2020-05-12 DIAGNOSIS — N200 Calculus of kidney: Secondary | ICD-10-CM

## 2020-05-12 DIAGNOSIS — Z7952 Long term (current) use of systemic steroids: Secondary | ICD-10-CM

## 2020-05-12 DIAGNOSIS — A419 Sepsis, unspecified organism: Secondary | ICD-10-CM | POA: Diagnosis present

## 2020-05-12 DIAGNOSIS — Z79899 Other long term (current) drug therapy: Secondary | ICD-10-CM

## 2020-05-12 DIAGNOSIS — E875 Hyperkalemia: Secondary | ICD-10-CM | POA: Diagnosis present

## 2020-05-12 DIAGNOSIS — Z87891 Personal history of nicotine dependence: Secondary | ICD-10-CM

## 2020-05-12 DIAGNOSIS — M069 Rheumatoid arthritis, unspecified: Secondary | ICD-10-CM | POA: Diagnosis present

## 2020-05-12 DIAGNOSIS — R778 Other specified abnormalities of plasma proteins: Secondary | ICD-10-CM

## 2020-05-12 DIAGNOSIS — N35919 Unspecified urethral stricture, male, unspecified site: Secondary | ICD-10-CM | POA: Diagnosis present

## 2020-05-12 DIAGNOSIS — R509 Fever, unspecified: Secondary | ICD-10-CM

## 2020-05-12 DIAGNOSIS — K219 Gastro-esophageal reflux disease without esophagitis: Secondary | ICD-10-CM | POA: Diagnosis present

## 2020-05-12 DIAGNOSIS — E11649 Type 2 diabetes mellitus with hypoglycemia without coma: Secondary | ICD-10-CM

## 2020-05-12 DIAGNOSIS — I4891 Unspecified atrial fibrillation: Secondary | ICD-10-CM | POA: Diagnosis present

## 2020-05-12 DIAGNOSIS — R0902 Hypoxemia: Secondary | ICD-10-CM

## 2020-05-12 DIAGNOSIS — N201 Calculus of ureter: Secondary | ICD-10-CM

## 2020-05-12 DIAGNOSIS — I2510 Atherosclerotic heart disease of native coronary artery without angina pectoris: Secondary | ICD-10-CM | POA: Diagnosis present

## 2020-05-12 DIAGNOSIS — I480 Paroxysmal atrial fibrillation: Secondary | ICD-10-CM | POA: Diagnosis present

## 2020-05-12 DIAGNOSIS — I10 Essential (primary) hypertension: Secondary | ICD-10-CM | POA: Diagnosis present

## 2020-05-12 DIAGNOSIS — G9341 Metabolic encephalopathy: Secondary | ICD-10-CM | POA: Diagnosis present

## 2020-05-12 DIAGNOSIS — I5043 Acute on chronic combined systolic (congestive) and diastolic (congestive) heart failure: Secondary | ICD-10-CM | POA: Diagnosis not present

## 2020-05-12 DIAGNOSIS — I248 Other forms of acute ischemic heart disease: Secondary | ICD-10-CM | POA: Diagnosis present

## 2020-05-12 DIAGNOSIS — D62 Acute posthemorrhagic anemia: Secondary | ICD-10-CM | POA: Diagnosis not present

## 2020-05-12 DIAGNOSIS — A4151 Sepsis due to Escherichia coli [E. coli]: Principal | ICD-10-CM | POA: Diagnosis present

## 2020-05-12 DIAGNOSIS — N183 Chronic kidney disease, stage 3 unspecified: Secondary | ICD-10-CM | POA: Diagnosis present

## 2020-05-12 DIAGNOSIS — R6520 Severe sepsis without septic shock: Secondary | ICD-10-CM | POA: Diagnosis present

## 2020-05-12 DIAGNOSIS — D849 Immunodeficiency, unspecified: Secondary | ICD-10-CM | POA: Diagnosis present

## 2020-05-12 DIAGNOSIS — N151 Renal and perinephric abscess: Secondary | ICD-10-CM | POA: Diagnosis present

## 2020-05-12 DIAGNOSIS — Z791 Long term (current) use of non-steroidal anti-inflammatories (NSAID): Secondary | ICD-10-CM

## 2020-05-12 DIAGNOSIS — T17908A Unspecified foreign body in respiratory tract, part unspecified causing other injury, initial encounter: Secondary | ICD-10-CM

## 2020-05-12 DIAGNOSIS — E872 Acidosis: Secondary | ICD-10-CM | POA: Diagnosis present

## 2020-05-12 DIAGNOSIS — R58 Hemorrhage, not elsewhere classified: Secondary | ICD-10-CM

## 2020-05-12 DIAGNOSIS — Z7982 Long term (current) use of aspirin: Secondary | ICD-10-CM

## 2020-05-12 DIAGNOSIS — E118 Type 2 diabetes mellitus with unspecified complications: Secondary | ICD-10-CM | POA: Diagnosis present

## 2020-05-12 DIAGNOSIS — I5042 Chronic combined systolic (congestive) and diastolic (congestive) heart failure: Secondary | ICD-10-CM | POA: Diagnosis present

## 2020-05-12 DIAGNOSIS — Z6837 Body mass index (BMI) 37.0-37.9, adult: Secondary | ICD-10-CM

## 2020-05-12 DIAGNOSIS — K661 Hemoperitoneum: Secondary | ICD-10-CM | POA: Diagnosis not present

## 2020-05-12 DIAGNOSIS — Z20822 Contact with and (suspected) exposure to covid-19: Secondary | ICD-10-CM | POA: Diagnosis present

## 2020-05-12 DIAGNOSIS — N179 Acute kidney failure, unspecified: Secondary | ICD-10-CM | POA: Diagnosis present

## 2020-05-12 DIAGNOSIS — L0291 Cutaneous abscess, unspecified: Secondary | ICD-10-CM

## 2020-05-12 DIAGNOSIS — E1122 Type 2 diabetes mellitus with diabetic chronic kidney disease: Secondary | ICD-10-CM | POA: Diagnosis present

## 2020-05-12 DIAGNOSIS — I130 Hypertensive heart and chronic kidney disease with heart failure and stage 1 through stage 4 chronic kidney disease, or unspecified chronic kidney disease: Secondary | ICD-10-CM | POA: Diagnosis present

## 2020-05-12 MED ORDER — VANCOMYCIN HCL IN DEXTROSE 1-5 GM/200ML-% IV SOLN: 1000.0000 mg | Freq: Once | INTRAVENOUS | Status: DC

## 2020-05-12 MED ORDER — LACTATED RINGERS IV BOLUS (SEPSIS)
500.0000 mL | Freq: Once | INTRAVENOUS | Status: AC
  Administered 2020-05-13: 500 mL via INTRAVENOUS

## 2020-05-12 MED ORDER — SODIUM CHLORIDE 0.9 % IV SOLN
2.0000 g | Freq: Once | INTRAVENOUS | Status: AC
  Administered 2020-05-13: 2 g via INTRAVENOUS
  Filled 2020-05-12: qty 2

## 2020-05-12 MED ORDER — METRONIDAZOLE IN NACL 5-0.79 MG/ML-% IV SOLN
500.0000 mg | Freq: Once | INTRAVENOUS | Status: AC
  Administered 2020-05-13: 500 mg via INTRAVENOUS
  Filled 2020-05-12: qty 100

## 2020-05-12 MED ORDER — ACETAMINOPHEN 650 MG RE SUPP
650.0000 mg | Freq: Once | RECTAL | Status: AC
  Administered 2020-05-13: 650 mg via RECTAL
  Filled 2020-05-12: qty 1

## 2020-05-13 ENCOUNTER — Emergency Department (HOSPITAL_COMMUNITY): Payer: Medicare Other

## 2020-05-13 ENCOUNTER — Inpatient Hospital Stay (HOSPITAL_COMMUNITY): Payer: Medicare Other

## 2020-05-13 DIAGNOSIS — N201 Calculus of ureter: Secondary | ICD-10-CM

## 2020-05-13 DIAGNOSIS — N183 Chronic kidney disease, stage 3 unspecified: Secondary | ICD-10-CM | POA: Diagnosis present

## 2020-05-13 DIAGNOSIS — N200 Calculus of kidney: Secondary | ICD-10-CM

## 2020-05-13 DIAGNOSIS — D62 Acute posthemorrhagic anemia: Secondary | ICD-10-CM | POA: Diagnosis not present

## 2020-05-13 DIAGNOSIS — I480 Paroxysmal atrial fibrillation: Secondary | ICD-10-CM | POA: Diagnosis not present

## 2020-05-13 DIAGNOSIS — I5043 Acute on chronic combined systolic (congestive) and diastolic (congestive) heart failure: Secondary | ICD-10-CM | POA: Diagnosis not present

## 2020-05-13 DIAGNOSIS — Z7952 Long term (current) use of systemic steroids: Secondary | ICD-10-CM | POA: Diagnosis not present

## 2020-05-13 DIAGNOSIS — I2510 Atherosclerotic heart disease of native coronary artery without angina pectoris: Secondary | ICD-10-CM | POA: Diagnosis present

## 2020-05-13 DIAGNOSIS — K219 Gastro-esophageal reflux disease without esophagitis: Secondary | ICD-10-CM | POA: Diagnosis present

## 2020-05-13 DIAGNOSIS — Z794 Long term (current) use of insulin: Secondary | ICD-10-CM | POA: Diagnosis not present

## 2020-05-13 DIAGNOSIS — A4151 Sepsis due to Escherichia coli [E. coli]: Secondary | ICD-10-CM | POA: Diagnosis present

## 2020-05-13 DIAGNOSIS — N151 Renal and perinephric abscess: Secondary | ICD-10-CM | POA: Diagnosis present

## 2020-05-13 DIAGNOSIS — N179 Acute kidney failure, unspecified: Secondary | ICD-10-CM | POA: Diagnosis present

## 2020-05-13 DIAGNOSIS — A419 Sepsis, unspecified organism: Secondary | ICD-10-CM | POA: Diagnosis not present

## 2020-05-13 DIAGNOSIS — M069 Rheumatoid arthritis, unspecified: Secondary | ICD-10-CM | POA: Diagnosis present

## 2020-05-13 DIAGNOSIS — D849 Immunodeficiency, unspecified: Secondary | ICD-10-CM | POA: Diagnosis present

## 2020-05-13 DIAGNOSIS — Z791 Long term (current) use of non-steroidal anti-inflammatories (NSAID): Secondary | ICD-10-CM | POA: Diagnosis not present

## 2020-05-13 DIAGNOSIS — Z7982 Long term (current) use of aspirin: Secondary | ICD-10-CM | POA: Diagnosis not present

## 2020-05-13 DIAGNOSIS — E872 Acidosis: Secondary | ICD-10-CM | POA: Diagnosis present

## 2020-05-13 DIAGNOSIS — I10 Essential (primary) hypertension: Secondary | ICD-10-CM | POA: Diagnosis not present

## 2020-05-13 DIAGNOSIS — K661 Hemoperitoneum: Secondary | ICD-10-CM | POA: Diagnosis not present

## 2020-05-13 DIAGNOSIS — G9341 Metabolic encephalopathy: Secondary | ICD-10-CM | POA: Diagnosis not present

## 2020-05-13 DIAGNOSIS — L0291 Cutaneous abscess, unspecified: Secondary | ICD-10-CM | POA: Diagnosis not present

## 2020-05-13 DIAGNOSIS — I5042 Chronic combined systolic (congestive) and diastolic (congestive) heart failure: Secondary | ICD-10-CM | POA: Diagnosis not present

## 2020-05-13 DIAGNOSIS — I313 Pericardial effusion (noninflammatory): Secondary | ICD-10-CM | POA: Diagnosis not present

## 2020-05-13 DIAGNOSIS — I130 Hypertensive heart and chronic kidney disease with heart failure and stage 1 through stage 4 chronic kidney disease, or unspecified chronic kidney disease: Secondary | ICD-10-CM | POA: Diagnosis present

## 2020-05-13 DIAGNOSIS — N136 Pyonephrosis: Secondary | ICD-10-CM | POA: Diagnosis present

## 2020-05-13 DIAGNOSIS — Z79899 Other long term (current) drug therapy: Secondary | ICD-10-CM | POA: Diagnosis not present

## 2020-05-13 DIAGNOSIS — Z87891 Personal history of nicotine dependence: Secondary | ICD-10-CM | POA: Diagnosis not present

## 2020-05-13 DIAGNOSIS — R6520 Severe sepsis without septic shock: Secondary | ICD-10-CM | POA: Diagnosis present

## 2020-05-13 DIAGNOSIS — I248 Other forms of acute ischemic heart disease: Secondary | ICD-10-CM | POA: Diagnosis present

## 2020-05-13 DIAGNOSIS — A4150 Gram-negative sepsis, unspecified: Secondary | ICD-10-CM

## 2020-05-13 DIAGNOSIS — Z20822 Contact with and (suspected) exposure to covid-19: Secondary | ICD-10-CM | POA: Diagnosis present

## 2020-05-13 DIAGNOSIS — E1122 Type 2 diabetes mellitus with diabetic chronic kidney disease: Secondary | ICD-10-CM

## 2020-05-13 LAB — BLOOD CULTURE ID PANEL (REFLEXED) - BCID2

## 2020-05-13 LAB — CBC
HCT: 32.2 % — ABNORMAL LOW (ref 39.0–52.0)
Hemoglobin: 10.1 g/dL — ABNORMAL LOW (ref 13.0–17.0)
MCH: 28.2 pg (ref 26.0–34.0)
MCHC: 31.4 g/dL (ref 30.0–36.0)
MCV: 89.9 fL (ref 80.0–100.0)
Platelets: 283 10*3/uL (ref 150–400)
RBC: 3.58 MIL/uL — ABNORMAL LOW (ref 4.22–5.81)
RDW: 18.2 % — ABNORMAL HIGH (ref 11.5–15.5)
WBC: 27.1 10*3/uL — ABNORMAL HIGH (ref 4.0–10.5)
nRBC: 0 % (ref 0.0–0.2)

## 2020-05-13 LAB — URINALYSIS, ROUTINE W REFLEX MICROSCOPIC
Bilirubin Urine: NEGATIVE
Glucose, UA: NEGATIVE mg/dL
Ketones, ur: 5 mg/dL — AB
Nitrite: NEGATIVE
Protein, ur: >=300 mg/dL — AB
Specific Gravity, Urine: 1.015 (ref 1.005–1.030)
WBC, UA: >50 WBC/hpf — ABNORMAL HIGH (ref 0–5)
pH: 5 (ref 5.0–8.0)

## 2020-05-13 LAB — COMPREHENSIVE METABOLIC PANEL
ALT: 23 U/L (ref 0–44)
ALT: 19 U/L (ref 0–44)
AST: 20 U/L (ref 15–41)
AST: 16 U/L (ref 15–41)
Albumin: 2.8 g/dL — ABNORMAL LOW (ref 3.5–5.0)
Albumin: 2.3 g/dL — ABNORMAL LOW (ref 3.5–5.0)
Alkaline Phosphatase: 59 U/L (ref 38–126)
Alkaline Phosphatase: 45 U/L (ref 38–126)
Anion gap: 11 (ref 5–15)
Anion gap: 12 (ref 5–15)
BUN: 30 mg/dL — ABNORMAL HIGH (ref 8–23)
BUN: 31 mg/dL — ABNORMAL HIGH (ref 8–23)
CO2: 28 mmol/L (ref 22–32)
CO2: 23 mmol/L (ref 22–32)
Calcium: 8.6 mg/dL — ABNORMAL LOW (ref 8.9–10.3)
Calcium: 7.9 mg/dL — ABNORMAL LOW (ref 8.9–10.3)
Chloride: 102 mmol/L (ref 98–111)
Chloride: 104 mmol/L (ref 98–111)
Creatinine, Ser: 2.33 mg/dL — ABNORMAL HIGH (ref 0.61–1.24)
Creatinine, Ser: 2.46 mg/dL — ABNORMAL HIGH (ref 0.61–1.24)
GFR calc Af Amer: 33 mL/min — ABNORMAL LOW (ref >60)
GFR calc Af Amer: 31 mL/min — ABNORMAL LOW (ref >60)
GFR calc non Af Amer: 28 mL/min — ABNORMAL LOW (ref >60)
GFR calc non Af Amer: 26 mL/min — ABNORMAL LOW (ref >60)
Glucose, Bld: 142 mg/dL — ABNORMAL HIGH (ref 70–99)
Glucose, Bld: 162 mg/dL — ABNORMAL HIGH (ref 70–99)
Potassium: 5.7 mmol/L — ABNORMAL HIGH (ref 3.5–5.1)
Potassium: 5.4 mmol/L — ABNORMAL HIGH (ref 3.5–5.1)
Sodium: 141 mmol/L (ref 135–145)
Sodium: 139 mmol/L (ref 135–145)
Total Bilirubin: 1.2 mg/dL (ref 0.3–1.2)
Total Bilirubin: 1.2 mg/dL (ref 0.3–1.2)
Total Protein: 7.7 g/dL (ref 6.5–8.1)
Total Protein: 6.6 g/dL (ref 6.5–8.1)

## 2020-05-13 LAB — CBC WITH DIFFERENTIAL/PLATELET
Abs Immature Granulocytes: 0.56 10*3/uL — ABNORMAL HIGH (ref 0.00–0.07)
Basophils Absolute: 0 10*3/uL (ref 0.0–0.1)
Basophils Relative: 0 %
Eosinophils Absolute: 0 10*3/uL (ref 0.0–0.5)
Eosinophils Relative: 0 %
HCT: 39.8 % (ref 39.0–52.0)
Hemoglobin: 12.7 g/dL — ABNORMAL LOW (ref 13.0–17.0)
Immature Granulocytes: 2 %
Lymphocytes Relative: 7 %
Lymphs Abs: 1.7 10*3/uL (ref 0.7–4.0)
MCH: 28.2 pg (ref 26.0–34.0)
MCHC: 31.9 g/dL (ref 30.0–36.0)
MCV: 88.4 fL (ref 80.0–100.0)
Monocytes Absolute: 2.2 10*3/uL — ABNORMAL HIGH (ref 0.1–1.0)
Monocytes Relative: 9 %
Neutro Abs: 19.1 10*3/uL — ABNORMAL HIGH (ref 1.7–7.7)
Neutrophils Relative %: 82 %
Platelets: 387 10*3/uL (ref 150–400)
RBC: 4.5 MIL/uL (ref 4.22–5.81)
RDW: 18.9 % — ABNORMAL HIGH (ref 11.5–15.5)
WBC: 23.6 10*3/uL — ABNORMAL HIGH (ref 4.0–10.5)
nRBC: 0 % (ref 0.0–0.2)

## 2020-05-13 LAB — PROTIME-INR
INR: 1.3 — ABNORMAL HIGH (ref 0.8–1.2)
INR: 1.4 — ABNORMAL HIGH (ref 0.8–1.2)
Prothrombin Time: 15.3 seconds — ABNORMAL HIGH (ref 11.4–15.2)
Prothrombin Time: 16.9 s — ABNORMAL HIGH (ref 11.4–15.2)

## 2020-05-13 LAB — CORTISOL-AM, BLOOD: Cortisol - AM: 86.4 ug/dL — ABNORMAL HIGH (ref 6.7–22.6)

## 2020-05-13 LAB — LACTIC ACID, PLASMA
Lactic Acid, Venous: 2.7 mmol/L (ref 0.5–1.9)
Lactic Acid, Venous: 3 mmol/L (ref 0.5–1.9)

## 2020-05-13 LAB — RESPIRATORY PANEL BY RT PCR (FLU A&B, COVID)
Influenza A by PCR: NEGATIVE
Influenza B by PCR: NEGATIVE
SARS Coronavirus 2 by RT PCR: NEGATIVE

## 2020-05-13 LAB — PROCALCITONIN: Procalcitonin: 15.16 ng/mL

## 2020-05-13 LAB — APTT: aPTT: 31 seconds (ref 24–36)

## 2020-05-13 MED ORDER — SODIUM CHLORIDE 0.9 % IV SOLN
2.0000 g | INTRAVENOUS | Status: DC
  Administered 2020-05-13 – 2020-05-23 (×11): 2 g via INTRAVENOUS
  Filled 2020-05-13 (×3): qty 20
  Filled 2020-05-13: qty 2
  Filled 2020-05-13 (×5): qty 20
  Filled 2020-05-13: qty 2
  Filled 2020-05-13: qty 20

## 2020-05-13 MED ORDER — HYDROCORTISONE NA SUCCINATE PF 100 MG IJ SOLR
100.0000 mg | INTRAMUSCULAR | Status: AC
  Administered 2020-05-13: 100 mg via INTRAVENOUS
  Filled 2020-05-13: qty 2

## 2020-05-13 MED ORDER — ONDANSETRON HCL 4 MG PO TABS: 4.0000 mg | ORAL_TABLET | Freq: Four times a day (QID) | ORAL | Status: DC | PRN

## 2020-05-13 MED ORDER — ENOXAPARIN SODIUM 40 MG/0.4ML ~~LOC~~ SOLN
40.0000 mg | SUBCUTANEOUS | Status: DC
  Administered 2020-05-13 – 2020-05-15 (×3): 40 mg via SUBCUTANEOUS
  Filled 2020-05-13 (×3): qty 0.4

## 2020-05-13 MED ORDER — SODIUM CHLORIDE 0.9% FLUSH
5.0000 mL | Freq: Three times a day (TID) | INTRAVENOUS | Status: DC
  Administered 2020-05-13 – 2020-05-24 (×17): 5 mL

## 2020-05-13 MED ORDER — MIDAZOLAM HCL 2 MG/2ML IJ SOLN
INTRAMUSCULAR | Status: AC
  Filled 2020-05-13: qty 2

## 2020-05-13 MED ORDER — LACTATED RINGERS IV BOLUS
500.0000 mL | Freq: Once | INTRAVENOUS | Status: AC
  Administered 2020-05-13: 500 mL via INTRAVENOUS

## 2020-05-13 MED ORDER — HYDROCORTISONE NA SUCCINATE PF 100 MG IJ SOLR
50.0000 mg | Freq: Four times a day (QID) | INTRAMUSCULAR | Status: DC
  Administered 2020-05-13 – 2020-05-15 (×8): 50 mg via INTRAVENOUS
  Filled 2020-05-13 (×8): qty 2

## 2020-05-13 MED ORDER — SODIUM CHLORIDE 0.9 % IV SOLN
2.0000 g | Freq: Two times a day (BID) | INTRAVENOUS | Status: DC
  Administered 2020-05-13: 2 g via INTRAVENOUS
  Filled 2020-05-13: qty 2

## 2020-05-13 MED ORDER — FENTANYL CITRATE (PF) 100 MCG/2ML IJ SOLN
INTRAMUSCULAR | Status: AC | PRN
  Administered 2020-05-13: 25 ug via INTRAVENOUS

## 2020-05-13 MED ORDER — ONDANSETRON HCL 4 MG/2ML IJ SOLN
4.0000 mg | Freq: Four times a day (QID) | INTRAMUSCULAR | Status: DC | PRN
  Filled 2020-05-13: qty 2

## 2020-05-13 MED ORDER — METRONIDAZOLE IN NACL 5-0.79 MG/ML-% IV SOLN
500.0000 mg | Freq: Three times a day (TID) | INTRAVENOUS | Status: DC
  Administered 2020-05-13 – 2020-05-17 (×12): 500 mg via INTRAVENOUS
  Filled 2020-05-13 (×16): qty 100

## 2020-05-13 MED ORDER — LIDOCAINE HCL URETHRAL/MUCOSAL 2 % EX GEL
1.0000 "application " | Freq: Once | CUTANEOUS | Status: AC
  Administered 2020-05-13: 1 via URETHRAL
  Filled 2020-05-13: qty 11

## 2020-05-13 MED ORDER — LACTATED RINGERS IV BOLUS (SEPSIS)
1000.0000 mL | Freq: Once | INTRAVENOUS | Status: AC
  Administered 2020-05-13: 1000 mL via INTRAVENOUS

## 2020-05-13 MED ORDER — LACTATED RINGERS IV BOLUS (SEPSIS)
500.0000 mL | Freq: Once | INTRAVENOUS | Status: AC
  Administered 2020-05-13: 500 mL via INTRAVENOUS

## 2020-05-13 MED ORDER — MIDAZOLAM HCL 2 MG/2ML IJ SOLN
INTRAMUSCULAR | Status: AC | PRN
  Administered 2020-05-13: 0.5 mg via INTRAVENOUS

## 2020-05-13 MED ORDER — OXYCODONE HCL 5 MG PO TABS
5.0000 mg | ORAL_TABLET | Freq: Four times a day (QID) | ORAL | Status: DC | PRN
  Administered 2020-05-13 – 2020-05-14 (×3): 5 mg via ORAL
  Filled 2020-05-13 (×3): qty 1

## 2020-05-13 MED ORDER — ACETAMINOPHEN 325 MG PO TABS
650.0000 mg | ORAL_TABLET | Freq: Four times a day (QID) | ORAL | Status: DC | PRN
  Administered 2020-05-13 – 2020-05-23 (×15): 650 mg via ORAL
  Filled 2020-05-13 (×15): qty 2

## 2020-05-13 MED ORDER — SODIUM CHLORIDE 0.9 % IV SOLN: 2.0000 g | Freq: Once | INTRAVENOUS | Status: DC

## 2020-05-13 MED ORDER — VANCOMYCIN HCL 2000 MG/400ML IV SOLN
2000.0000 mg | Freq: Once | INTRAVENOUS | Status: AC
  Administered 2020-05-13: 2000 mg via INTRAVENOUS
  Filled 2020-05-13: qty 400

## 2020-05-13 MED ORDER — LACTATED RINGERS IV SOLN: INTRAVENOUS | Status: DC

## 2020-05-13 MED ORDER — FENTANYL CITRATE (PF) 100 MCG/2ML IJ SOLN
INTRAMUSCULAR | Status: AC
  Filled 2020-05-13: qty 2

## 2020-05-13 MED ORDER — ACETAMINOPHEN 650 MG RE SUPP: 650.0000 mg | Freq: Four times a day (QID) | RECTAL | Status: DC | PRN

## 2020-05-13 NOTE — Assessment & Plan Note (Signed)
-  Monitor for signs of volume overload while on fluids -Follows outpatient with cardiology

## 2020-05-13 NOTE — Progress Notes (Addendum)
PROGRESS NOTE    GIDEON BURSTEIN   KNL:[id number]  DOB: [birth year]  DOA: 05/12/2020     0  PCP: [name], MD  CC: AMS  Hospital Course: Mr. Sessler is a 64 yo AA male with PMH DMII, HTN, dCHF (EF most recently 50-55% in May, grade 1 DD), RA on biologic therapy.  Patient was admitted in April / May most recently.  Had sepsis, hypotension at that time secondary to UTI with a R perinephric abscess (3.9 cm at that time).  Drainage of perinephric abscess in late May grew out E.Coli sensitive to cefepime, rocephin.  He had an MRI abdomen to follow up in Aug which demonstrated worsening of the R paranephric abscess (see MR result from that time).   Patient presents to the ED with AMS, patient not able to provide much in the way of history due to lethargy.  In the ER he was febrile, 105.2 and developed hypotension.  WBC 23.6, Creat up to 2.3 (baseline 1.3).  UA consistent with infection CT abd/pelvis revealed right perinephric abscess and left renal calculus.  Urology and IR were consulted. He underwent placement of right perc drain to the perinephric abscess with 35F pigtail drain on 05/13/20.  Blood and urine cultures were obtained on admission as well as culture from perinephric abscess which was sent after drain placement. He was initially treated with vancomycin, cefepime, Flagyl in the ER and continued on cefepime and flagyl while cultures further developed.    Interval History:  Seen in the ER waiting on a room still this morning.  He appeared to be a little more alert and responsive compared to his mentation after chart review.  He was oriented to his name, year, hospital however he did have to think very hard about this and messed up a few times but ultimately got the right answer.  Old records reviewed in assessment of this patient  ROS: Constitutional: positive for chills, fatigue and fevers, Respiratory: negative for cough, Cardiovascular: negative for chest pain and  Gastrointestinal: positive for abdominal pain  Assessment & Plan: * Severe sepsis (HCC) -Fever, tachycardia, tachypnea, leukocytosis, lactic acidosis, encephalopathy, source of infection considered urinary/perinephric abscess -Given vancomycin, cefepime, Flagyl on admission -Continue cefepime and Flagyl until cultures further mature -Blood cultures growing 2/4 bottles GNR. Follow up rapid ID.  Will repeat cultures on 05/14/2020 -Follow-up urine culture -Perinephric abscess culture also growing GNR, continue to follow - trend PCT (initial 15)  Acute renal failure superimposed on stage 3 chronic kidney disease ([place]) -Unclear etiology, but possible contribution from outlet obstruction versus severe sepsis -Patient may still need left-sided percutaneous nephrostomy tube -Continue LR and monitor renal function  Perinephric abscess - right sided; seen on CT - s/p drain placement with IR on 10/3 - follow up culture results - see severe sepsis   Nephrolithiasis -CT reveals a 4 mm left-sided ureteral calculus.  Per urology, patient may still need percutaneous nephrostomy tube -Continue to monitor renal response to fluids and current interventions in place as well as further plans per urology  Hyperkalemia - continue tele - no indication for treatment at this time - BMP in am   Acute metabolic encephalopathy -Typically alert and oriented.  Encephalopathy considered in setting of underlying infection  Chronic combined systolic and diastolic CHF (congestive heart failure) ([place]) -Monitor for signs of volume overload while on fluids -Follows outpatient with cardiology  Type 2 diabetes mellitus with stage 3 chronic kidney disease, without long-term current use of insulin (  [place]) - continue SSI and CBG monitoring   Rheumatoid arthritis ([place]) -On outpatient treatment -See sepsis.  Was started on steroids as patient is on chronic prednisone 5 mg daily at home as well  Essential  hypertension -Holding BP meds in setting of severe sepsis  Atrial fibrillation ([place]) -Holding Coreg in setting of infection -Aspirin on hold in case of need for further interventions   Antimicrobials: Vancomycin 10/3/2021x1 Flagyl 05/13/2020>> present Cefepime 05/13/2020>> present  DVT prophylaxis: Lovenox Code Status: Full Family Communication: none present this am, but updated on phone on admission Disposition Plan: Status is: Inpatient  Remains inpatient appropriate because:Altered mental status, Ongoing diagnostic testing needed not appropriate for outpatient work up, Unsafe d/c plan, IV treatments appropriate due to intensity of illness or inability to take PO and Inpatient level of care appropriate due to severity of illness   Dispo: The patient is from: Home              Anticipated d/c is to: pending eventual PT eval and clinical course              Anticipated d/c date is: > 3 days              Patient currently is not medically stable to d/c.       Objective: Blood pressure 128/77, pulse 72, temperature 98.8 F (37.1 C), temperature source Oral, resp. rate 14, weight 106.2 kg, SpO2 98 %.  Examination: General appearance: fatigued, no distress, slowed mentation and toxic Head: Normocephalic, without obvious abnormality, atraumatic Eyes: EOMI Lungs: clear to auscultation bilaterally Heart: regular rate and rhythm and S1, S2 normal Abdomen: nonspecific TTP, no R/G, BS present, obese, ND Extremities: no edema Skin: mobility and turgor normal Neurologic: no focal deficits; grossly weak  Consultants:   Urology  IR  Procedures:   Right perinephric abscess drain placement, 05/13/20  Data Reviewed: I have personally reviewed following labs and imaging studies Results for orders placed or performed during the hospital encounter of 05/12/20 (from the past 24 hour(s))  Lactic acid, plasma     Status: Abnormal   Collection Time: 05/13/20 12:10 AM  Result Value  Ref Range   Lactic Acid, Venous 3.0 (HH) 0.5 - 1.9 mmol/L  CBC WITH DIFFERENTIAL     Status: Abnormal   Collection Time: 05/13/20 12:10 AM  Result Value Ref Range   WBC 23.6 (H) 4.0 - 10.5 K/uL   RBC 4.50 4.22 - 5.81 MIL/uL   Hemoglobin 12.7 (L) 13.0 - 17.0 g/dL   HCT 39.8 39 - 52 %   MCV 88.4 80.0 - 100.0 fL   MCH 28.2 26.0 - 34.0 pg   MCHC 31.9 30.0 - 36.0 g/dL   RDW 18.9 (H) 11.5 - 15.5 %   Platelets 387 150 - 400 K/uL   nRBC 0.0 0.0 - 0.2 %   Neutrophils Relative % 82 %   Neutro Abs 19.1 (H) 1.7 - 7.7 K/uL   Lymphocytes Relative 7 %   Lymphs Abs 1.7 0.7 - 4.0 K/uL   Monocytes Relative 9 %   Monocytes Absolute 2.2 (H) 0 - 1 K/uL   Eosinophils Relative 0 %   Eosinophils Absolute 0.0 0 - 0 K/uL   Basophils Relative 0 %   Basophils Absolute 0.0 0 - 0 K/uL   Immature Granulocytes 2 %   Abs Immature Granulocytes 0.56 (H) 0.00 - 0.07 K/uL  Blood Culture (routine x 2)     Status: None (Preliminary result)  Collection Time: 05/13/20 12:10 AM   Specimen: BLOOD  Result Value Ref Range   Specimen Description      BLOOD RIGHT ANTECUBITAL Performed at [facility], [place] [address], [place], [place] [address]    Special Requests      BOTTLES DRAWN AEROBIC AND ANAEROBIC Blood Culture adequate volume Performed at [facility] [address] [place], [place] [address]    Culture  Setup Time      GRAM NEGATIVE RODS IN BOTH AEROBIC AND ANAEROBIC BOTTLES Organism ID to follow Performed at [facility], [place] [address], [place], [place] [address]    Culture GRAM NEGATIVE RODS    Report Status PENDING   Blood Culture (routine x 2)     Status: None (Preliminary result)   Collection Time: 05/13/20 12:54 AM   Specimen: BLOOD LEFT FOREARM  Result Value Ref Range   Specimen Description      BLOOD LEFT FOREARM Performed at [facility] [address], [place], [place] [address]    Special Requests      BOTTLES DRAWN  AEROBIC AND ANAEROBIC Blood Culture results may not be optimal due to an excessive volume of blood received in culture bottles Performed at [facility] [address], [place],  [address]    Culture      NO GROWTH < 12 HOURS Performed at [facility] [address] [place],  [address]    Report Status PENDING   Urinalysis, Routine w reflex microscopic Urine, Catheterized     Status: Abnormal   Collection Time: 05/13/20 12:54 AM  Result Value Ref Range   Color, Urine YELLOW YELLOW   APPearance TURBID (A) CLEAR   Specific Gravity, Urine 1.015 1.005 - 1.030   pH 5.0 5.0 - 8.0   Glucose, UA NEGATIVE NEGATIVE mg/dL   Hgb urine dipstick SMALL (A) NEGATIVE   Bilirubin Urine NEGATIVE NEGATIVE   Ketones, ur 5 (A) NEGATIVE mg/dL   Protein, ur >=300 (A) NEGATIVE mg/dL   Nitrite NEGATIVE NEGATIVE   Leukocytes,Ua MODERATE (A) NEGATIVE   RBC / HPF 21-50 0 - 5 RBC/hpf   WBC, UA >50 (H) 0 - 5 WBC/hpf   Bacteria, UA MANY (A) NONE SEEN   WBC Clumps PRESENT   Comprehensive metabolic panel     Status: Abnormal   Collection Time: 05/13/20 12:54 AM  Result Value Ref Range   Sodium 141 135 - 145 mmol/L   Potassium 5.7 (H) 3.5 - 5.1 mmol/L   Chloride 102 98 - 111 mmol/L   CO2 28 22 - 32 mmol/L   Glucose, Bld 142 (H) 70 - 99 mg/dL   BUN 30 (H) 8 - 23 mg/dL   Creatinine, Ser 2.33 (H) 0.61 - 1.24 mg/dL   Calcium 8.6 (L) 8.9 - 10.3 mg/dL   Total Protein 7.7 6.5 - 8.1 g/dL   Albumin 2.8 (L) 3.5 - 5.0 g/dL   AST 20 15 - 41 U/L   ALT 23 0 - 44 U/L   Alkaline Phosphatase 59 38 - 126 U/L   Total Bilirubin 1.2 0.3 - 1.2 mg/dL   GFR calc non Af Amer 28 (L) >60 mL/min   GFR calc Af Amer 33 (L) >60 mL/min   Anion gap 11 5 - 15  Protime-INR     Status: Abnormal   Collection Time: 05/13/20 12:54 AM  Result Value Ref Range   Prothrombin Time 15.3 (H) 11.4 - 15.2  seconds   INR 1.3 (H) 0.8 - 1.2  APTT     Status: None   Collection Time: 05/13/20 12:54 AM  Result  Value Ref Range   aPTT 31 24 - 36 seconds  Respiratory Panel by RT PCR (Flu A&B, Covid) - Nasopharyngeal Swab     Status: None   Collection Time: 05/13/20  1:26 AM   Specimen: Nasopharyngeal Swab  Result Value Ref Range   SARS Coronavirus 2 by RT PCR NEGATIVE NEGATIVE   Influenza A by PCR NEGATIVE NEGATIVE   Influenza B by PCR NEGATIVE NEGATIVE  Lactic acid, plasma     Status: Abnormal   Collection Time: 05/13/20  3:40 AM  Result Value Ref Range   Lactic Acid, Venous 2.7 (HH) 0.5 - 1.9 mmol/L  Aerobic/Anaerobic Culture (surgical/deep wound)     Status: None (Preliminary result)   Collection Time: 05/13/20  6:20 AM   Specimen: Abdomen; Abscess  Result Value Ref Range   Specimen Description      ABDOMEN Performed at [facility] [address] [place], [place] [address]    Special Requests      NONE Performed at [facility], [place] [address], [place], [place] [address]    Gram Stain      ABUNDANT WBC PRESENT, PREDOMINANTLY PMN MODERATE GRAM NEGATIVE RODS Performed at [facility], [place] [address] [place],  [address]    Culture PENDING    Report Status PENDING   Protime-INR     Status: Abnormal   Collection Time: 05/13/20  7:00 AM  Result Value Ref Range   Prothrombin Time 16.9 (H) 11.4 - 15.2 seconds   INR 1.4 (H) 0.8 - 1.2  Cortisol-am, blood     Status: Abnormal   Collection Time: 05/13/20  7:00 AM  Result Value Ref Range   Cortisol - AM 86.4 (H) 6.7 - 22.6 ug/dL  Procalcitonin     Status: None   Collection Time: 05/13/20  7:00 AM  Result Value Ref Range   Procalcitonin 15.16 ng/mL  CBC     Status: Abnormal   Collection Time: 05/13/20  7:00 AM  Result Value Ref Range   WBC 27.1 (H) 4.0 - 10.5 K/uL   RBC 3.58 (L) 4.22 - 5.81 MIL/uL   Hemoglobin 10.1 (L) 13.0 - 17.0 g/dL   HCT 32.2 (L) 39 - 52 %   MCV 89.9 80.0 - 100.0 fL   MCH 28.2 26.0 - 34.0 pg   MCHC 31.4 30.0 - 36.0 g/dL   RDW 18.2 (H) 11.5 - 15.5 %    Platelets 283 150 - 400 K/uL   nRBC 0.0 0.0 - 0.2 %  Comprehensive metabolic panel     Status: Abnormal   Collection Time: 05/13/20  7:00 AM  Result Value Ref Range   Sodium 139 135 - 145 mmol/L   Potassium 5.4 (H) 3.5 - 5.1 mmol/L   Chloride 104 98 - 111 mmol/L   CO2 23 22 - 32 mmol/L   Glucose, Bld 162 (H) 70 - 99 mg/dL   BUN 31 (H) 8 - 23 mg/dL   Creatinine, Ser 2.46 (H) 0.61 - 1.24 mg/dL   Calcium 7.9 (L) 8.9 - 10.3 mg/dL   Total Protein 6.6 6.5 - 8.1 g/dL   Albumin 2.3 (L) 3.5 - 5.0 g/dL   AST 16 15 - 41 U/L   ALT 19 0 - 44 U/L   Alkaline Phosphatase 45 38 - 126 U/L  Total Bilirubin 1.2 0.3 - 1.2 mg/dL   GFR calc non Af Amer 26 (L) >60 mL/min   GFR calc Af Amer 31 (L) >60 mL/min   Anion gap 12 5 - 15    Recent Results (from the past 240 hour(s))  Blood Culture (routine x 2)     Status: None (Preliminary result)   Collection Time: 05/13/20 12:10 AM   Specimen: BLOOD  Result Value Ref Range Status   Specimen Description   Final    BLOOD RIGHT ANTECUBITAL Performed at [facility] [address] [place], [place] [address]    Special Requests   Final    BOTTLES DRAWN AEROBIC AND ANAEROBIC Blood Culture adequate volume Performed at [facility] [address] [place], [place] [address]    Culture  Setup Time   Final    GRAM NEGATIVE RODS IN BOTH AEROBIC AND ANAEROBIC BOTTLES Organism ID to follow Performed at [facility], [place] [address] [place], [place] [address]    Culture GRAM NEGATIVE RODS  Final   Report Status PENDING  Incomplete  Blood Culture (routine x 2)     Status: None (Preliminary result)   Collection Time: 05/13/20 12:54 AM   Specimen: BLOOD LEFT FOREARM  Result Value Ref Range Status   Specimen Description   Final    BLOOD LEFT FOREARM Performed at [facility] [address], [place], [place] [address]    Special Requests   Final    BOTTLES DRAWN AEROBIC AND ANAEROBIC Blood  Culture results may not be optimal due to an excessive volume of blood received in culture bottles Performed at [facility] [address], [place], [place] [address]    Culture   Final    NO GROWTH < 12 HOURS Performed at [facility] [address] [place], [place] [address]    Report Status PENDING  Incomplete  Respiratory Panel by RT PCR (Flu A&B, Covid) - Nasopharyngeal Swab     Status: None   Collection Time: 05/13/20  1:26 AM   Specimen: Nasopharyngeal Swab  Result Value Ref Range Status   SARS Coronavirus 2 by RT PCR NEGATIVE NEGATIVE Final    Comment: (NOTE) SARS-CoV-2 target nucleic acids are NOT DETECTED.  The SARS-CoV-2 RNA is generally detectable in upper respiratoy specimens during the acute phase of infection. The lowest concentration of SARS-CoV-2 viral copies this assay can detect is 131 copies/mL. A negative result does not preclude SARS-Cov-2 infection and should not be used as the sole basis for treatment or other patient management decisions. A negative result may occur with  improper specimen collection/handling, submission of specimen other than nasopharyngeal swab, presence of viral mutation(s) within the areas targeted by this assay, and inadequate number of viral copies (<131 copies/mL). A negative result must be combined with clinical observations, patient history, and epidemiological information. The expected result is Negative.  Fact Sheet for Patients:  PinkCheek.be  Fact Sheet for Healthcare Providers:  GravelBags.it  This test is no t yet approved or cleared by the [place] FDA and  has been authorized for detection and/or diagnosis of SARS-CoV-2 by FDA under an Emergency Use Authorization (EUA). This EUA will remain  in effect (meaning this test can be used) for the duration of the COVID-19 declaration under Section 564(b)(1) of the Act, 21 U.S.C. section  360bbb-3(b)(1), unless the authorization is terminated or revoked sooner.     Influenza A by PCR  NEGATIVE NEGATIVE Final   Influenza B by PCR NEGATIVE NEGATIVE Final    Comment: (NOTE) The Xpert Xpress SARS-CoV-2/FLU/RSV assay is intended as an aid in  the diagnosis of influenza from Nasopharyngeal swab specimens and  should not be used as a sole basis for treatment. Nasal washings and  aspirates are unacceptable for Xpert Xpress SARS-CoV-2/FLU/RSV  testing.  Fact Sheet for Patients: PinkCheek.be  Fact Sheet for Healthcare Providers: GravelBags.it  This test is not yet approved or cleared by the [place] FDA and  has been authorized for detection and/or diagnosis of SARS-CoV-2 by  FDA under an Emergency Use Authorization (EUA). This EUA will remain  in effect (meaning this test can be used) for the duration of the  Covid-19 declaration under Section 564(b)(1) of the Act, 21  U.S.C. section 360bbb-3(b)(1), unless the authorization is  terminated or revoked. Performed at [facility], [place] [address] [place], [place] [address]   Aerobic/Anaerobic Culture (surgical/deep wound)     Status: None (Preliminary result)   Collection Time: 05/13/20  6:20 AM   Specimen: Abdomen; Abscess  Result Value Ref Range Status   Specimen Description   Final    ABDOMEN Performed at [facility] [address] [place], [place] [address]    Special Requests   Final    NONE Performed at [facility], [place] [address] [place], [place] [address]    Gram Stain   Final    ABUNDANT WBC PRESENT, PREDOMINANTLY PMN MODERATE GRAM NEGATIVE RODS Performed at [facility], [place] [address] [place], [place] [address]    Culture PENDING  Incomplete   Report Status PENDING  Incomplete     Radiology Studies: DG Chest Port 1 View  Result Date: 05/13/2020 CLINICAL DATA:  Fever EXAM:  PORTABLE CHEST 1 VIEW COMPARISON:  May 10, 2020 FINDINGS: The heart size and mediastinal contours are unchanged with mild cardiomegaly. There is streaky airspace opacity at the right base as the CT, likely atelectasis scarring. The left lung is clear. No acute osseous. IMPRESSION: Unchanged airspace opacity at the right base, likely atelectasis or scarring Electronically Signed   By: Prudencio Pair M.D.   On: 05/13/2020 00:34   CT Renal Stone Study  Result Date: 05/13/2020 CLINICAL DATA:  Fever, question of UTI EXAM: CT ABDOMEN AND PELVIS WITHOUT CONTRAST TECHNIQUE: Multidetector CT imaging of the abdomen and pelvis was performed following the standard protocol without IV contrast. COMPARISON:  MRI of 07/30/2020 FINDINGS: Lower chest: The visualized heart size within normal limits. There is a small pericardial effusion present. Trace bilateral pleural effusions/atelectasis is noted. Hepatobiliary: There are new heterogeneously hypodense lesions seen within the liver parenchyma. The largest in the posterior right liver lobe measures 5.7 cm. Within the anterior left liver lobe there to hypodense lesions the largest measuring 3.4 cm. No evidence of calcified gallstones or biliary ductal dilatation. Pancreas:  Unremarkable.  No surrounding inflammatory changes. Spleen: Normal in size. Although limited due to the lack of intravenous contrast, normal in appearance. Adrenals/Urinary Tract: Both adrenal glands appear normal. Again noted is a complex collection seen posterior to the right kidney measuring 4.5 cm in transverse dimension. There is heterogeneous fluid seen within the central portion and small foci of air now noted. A punctate calcifications seen in the upper pole of the right kidney. No hydronephrosis. There is mild left-sided perinephric fat stranding changes. Within the proximal left ureter there is a 4 mm calculus present. A 2  cm low-density lesion seen off the lower pole the left kidney.  Stomach/Bowel: The stomach, small bowel, and colon are normal in appearance. No inflammatory changes or obstructive findings. Scattered colonic diverticula are noted. A moderate amount of colonic stool is present. Vascular/Lymphatic: There are no enlarged abdominal or pelvic lymph nodes. Scattered aortic atherosclerotic calcifications are seen without aneurysmal dilatation. Reproductive: The prostate is unremarkable. Other: No evidence of abdominal wall mass or hernia. Musculoskeletal: No acute or significant osseous findings. IMPRESSION: Heterogeneous complex collection posterior to the right kidney now with new foci of air, however appears grossly unchanged in size from the prior exam dating back to August 2021. This likely represents of para renal abscess given the patient's history. New/enlarging hypodense lesions seen throughout the liver parenchyma which could represent hepatic abscesses versus metastatic disease. Mild left perinephric inflammatory changes, likely due to a proximal left 4 mm renal calculus. Small pericardial effusion Electronically Signed   By: Prudencio Pair M.D.   On: 05/13/2020 02:42   CT IMAGE GUIDED FLUID DRAIN BY CATHETER  Result Date: 05/13/2020 INDICATION: 64 year old male with recurrence/persisting abscess in the right perinephric space. He presents with sepsis and has been referred for percutaneous drainage EXAM: CT GUIDED DRAINAGE OF  ABSCESS MEDICATIONS: The patient is currently admitted to the hospital and receiving intravenous antibiotics. The antibiotics were administered within an appropriate time frame prior to the initiation of the procedure. ANESTHESIA/SEDATION: 0.5 mg IV Versed 25 mcg IV Fentanyl Moderate Sedation Time:  12 minutes The patient was continuously monitored during the procedure by the interventional radiology nurse under my direct supervision. COMPLICATIONS: None TECHNIQUE: Informed written consent was obtained from the patient after a thorough discussion of  the procedural risks, benefits and alternatives. All questions were addressed. Maximal Sterile Barrier Technique was utilized including caps, mask, sterile gowns, sterile gloves, sterile drape, hand hygiene and skin antiseptic. A timeout was performed prior to the initiation of the procedure. PROCEDURE: Patient was positioned left decubitus on the CT table. Scout CT was acquired. The right flank was prepped with chlorhexidine in a sterile fashion, and a sterile drape was applied covering the operative field. A sterile gown and sterile gloves were used for the procedure. Local anesthesia was provided with 1% Lidocaine. Once the patient is prepped and draped in the usual sterile fashion, CT guidance was used to advance a trocar needle into the phlegmon of the right perinephric space. Once we confirmed needle tip position with aspiration of purulent fluid, wire was placed and modified Seldinger technique was used to place a 10 [place] drain. Drain position was optimized with serial CT imaging. Drain was then sutured in position attached to bulb suction. Sample was sent to lab for culture. Patient tolerated the procedure well and remained hemodynamically stable throughout. No complications were encountered and no significant blood loss. FINDINGS: Ten French drain into the left perinephric abscess. Uncertain if there is communication between the suspected perihepatic abscess and the perinephric collection. IMPRESSION: Status post CT-guided drainage of right perinephric abscess. Signed, Dulcy Fanny. Dellia Nims, RPVI [facility] Electronically Signed   By: Corrie Mckusick D.O.   On: 05/13/2020 09:07   CT IMAGE GUIDED FLUID DRAIN BY CATHETER  Final Result    CT Renal Stone Study  Final Result    DG Chest Port 1 View  Final Result      Scheduled Meds: . enoxaparin (LOVENOX) injection  40 mg Subcutaneous Q24H  . fentaNYL      .  hydrocortisone sod succinate  (SOLU-CORTEF) inj  50 mg Intravenous Q6H  . midazolam      . sodium chloride flush  5 mL Intracatheter Q8H   PRN Meds: acetaminophen **OR** acetaminophen, ondansetron **OR** ondansetron (ZOFRAN) IV Continuous Infusions: . ceFEPime (MAXIPIME) IV Stopped (05/13/20 1602)  . lactated ringers 125 mL/hr at 05/13/20 1603  . metronidazole        LOS: 0 days  Time spent: Greater than 50% of the 35 minute visit was spent in counseling/coordination of care for the patient as laid out in the A&P.   Dwyane Dee, MD [facility] 05/13/2020, 4:38 PM  Contact via secure chat.  To contact the attending provider between 7A-7P or the covering provider during after hours 7P-7A, please log into the web site [email or web address] and access using universal [place] password for that web site. If you do not have the password, please call the hospital operator.

## 2020-05-13 NOTE — ED Provider Notes (Signed)
[facility] DEPT Provider Note   CSN: [id number] Arrival date & time: 05/12/20  2336     History Chief Complaint  Patient presents with  . Fever  . Weakness    Marco [name] is a 64 y.o. male with a history of CHF last EF 50-55%, afib not on anticoagulation, diabetes mellitus, CKD, hypertension, RA, OSA, & CAD who presents to the ED via EMS from home for AMS that has been worsening yesterday. History provided by EMS- state that they received call for increasing weakness/fatigue and declining mental status since yesterday per family. They state family noted cough as well as foul smelling urine as well. Per EMS febrile and tachycardic on their arrival, BP 591M systolic. No intervention PTA. Level 5 caveat applies secondary to AMS. EMS also states that he uses oxygen PRN & that he has had both doses of COVID 19 vaccine.   HPI     Past Medical History:  Diagnosis Date  . Benign colon polyp 08/01/2013   [facility] in [place]. large base tranverse colon polyp was biopsied. polyp was benign with minimal surface hyperplastic change.  . Chronic combined systolic and diastolic CHF (congestive heart failure) ([place])   . Chronic pain   . CKD (chronic kidney disease), stage III   . Diabetes mellitus without complication ([place])   . Diverticulosis   . Esophageal hiatal hernia 07/29/2013   confirmed on EGD   . Esophageal stricture   . Essential hypertension   . Gastritis 07/29/2013   confirmed on EGD, bx done an negative for intestinal metaplasia, dsyplasia or H. pylori. normal gastric emptying study done 07/13/2013.  [name] GERD (gastroesophageal reflux disease)   . Morbid obesity ([place])   . Non-obstructive CAD    a. 02/2013 Cath ([place]): nonobs dzs;  b. 09/2014 Myoview ([place]): EF 55%, no ischemia;  c. Cath 05/2018 mild CAD no obstruction.  . Persistent atrial fibrillation ([place])    a. 02/2013 s/p rfca in [place], [place]-->prev on Xarelto, d/c'd 2/2 anemia, ? GIB.   [name] Rheumatoid arthritis ([place])   . Sigmoid diverticulosis 08/01/2013   confirmed on colonscopy. record scanned into chart    Patient Active Problem List   Diagnosis Date Noted  . Vitamin B12 deficiency 02/19/2020  . Elevated LFTs   . Pleural effusion   . Weakness   . Loculated pleural effusion   . Complex renal cyst   . Urethral stricture 12/30/2019  . Nephrolithiasis 12/30/2019  . Acute febrile illness 12/30/2019  . Sepsis ([place]) 12/27/2019  . Severe sepsis ([place]) 12/10/2019  . Acute metabolic encephalopathy 38/46/6599  . Hyperkalemia 12/09/2019  . Sepsis due to Gram negative bacteria ([place]) 12/09/2019  . Rhinitis 10/16/2019  . Gastroesophageal reflux disease without esophagitis 08/31/2018  . Muscle spasm 08/31/2018  . NICM (nonischemic cardiomyopathy) ([place]) 07/06/2018  . Hypokalemia   . Acute renal failure superimposed on stage 3 chronic kidney disease ([place]) 08/27/2017  . Hyperlipidemia 12/23/2016  . Chronic combined systolic and diastolic CHF (congestive heart failure) ([place]) 09/09/2016  . Acute respiratory failure with hypoxia ([place]) 09/02/2016  . Diabetic polyneuropathy associated with type 2 diabetes mellitus ([place]) 06/10/2016  . Vision changes 06/02/2016  . Myalgia and myositis 03/31/2016  . Shortness of breath 02/09/2016  . Chronic gouty arthritis 11/29/2015  . LBP (low back pain) 11/27/2015  . Arthralgia of multiple joints 11/27/2015  . 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[place]) 11/27/2015  . Breast pain in male 11/14/2015  . Sinusitis, chronic 10/16/2015  . Insomnia  10/16/2015  . New onset of headaches after age 20 09/20/2015  . OSA (obstructive sleep apnea) 07/12/2015  . Low serum testosterone level 05/18/2015  . Depression 05/16/2015  . Morbid obesity due to excess calories ([place])   . Chronic pain   . Type 2 diabetes mellitus with stage 3 chronic kidney disease, without long-term current use of insulin ([place])   . CAD (coronary artery disease) 12/24/2014  . Atrial fibrillation  ([place]) 12/24/2014  . Essential hypertension 12/24/2014  . Chronic kidney disease 12/24/2014  . PUD (peptic ulcer disease) 12/24/2014    Past Surgical History:  Procedure Laterality Date  . CARDIAC CATHETERIZATION  05/2014   ablation for atrial fibrillation  . CARDIAC CATHETERIZATION N/A 11/16/2015   Procedure: Left Heart Cath and Coronary Angiography;  Surgeon: Leonie Man, MD;  Location: [facility];  Service: Cardiovascular;  Laterality: N/A;  . COLON RESECTION  09/2013   due to large, abnormal polpy. non cancerous per patient.   . COLON SURGERY  09/2014   colon resection   . IR PERC PLEURAL DRAIN W/INDWELL CATH W/IMG GUIDE  01/07/2020  . LEFT HEART CATH AND CORONARY ANGIOGRAPHY N/A 05/19/2018   Procedure: LEFT HEART CATH AND CORONARY ANGIOGRAPHY;  Surgeon: Wellington Hampshire, MD;  Location: [facility];  Service: Cardiovascular;  Laterality: N/A;       Family History  Problem Relation Age of Onset  . Hypertension Mother   . Diabetes Mother   . COPD Mother   . Lung cancer Mother        Smoker   . Hypertension Father   . Diabetes Father   . Heart Problems Father   . COPD Father   . Colon cancer Neg Hx   . Stomach cancer Neg Hx     Social History   Tobacco Use  . Smoking status: Former Smoker    Packs/day: 0.50    Years: 10.00    Pack years: 5.00    Types: Cigarettes    Quit date: 04/11/2008    Years since quitting: 12.0  . Smokeless tobacco: Never Used  Vaping Use  . Vaping Use: Never used  Substance Use Topics  . Alcohol use: No    Alcohol/week: 0.0 standard drinks  . Drug use: No    Home Medications Prior to Admission medications   Medication Sig Start Date End Date Taking? Authorizing Provider  Accu-Chek FastClix Lancets MISC 1 each by Other route 2 (two) times daily. E11.42 03/27/20   Renato Shin, MD  ACCU-CHEK GUIDE test strip USE 1 STRIP TWICE DAILY TO CHECK BLOOD GLUCOSE 02/13/20   Renato Shin, MD  albuterol (PROVENTIL HFA;VENTOLIN HFA)  108 (90 Base) MCG/ACT inhaler Inhale 2 puffs into the lungs every 6 (six) hours as needed for wheezing or shortness of breath. 04/29/18   [name], MD  allopurinol (ZYLOPRIM) 100 MG tablet Take 200 mg by mouth every evening.     [provider]  aspirin (EQ ASPIRIN ADULT LOW DOSE) 81 MG EC tablet TAKE 1 TABLET BY MOUTH ONCE DAILY. SWALLOW WHOLE 02/29/20   [name], MD  atorvastatin (LIPITOR) 40 MG tablet Take 1 tablet (40 mg total) by mouth daily. 09/27/19   Sueanne Margarita, MD  carvedilol (COREG) 12.5 MG tablet Take 1 tablet (12.5 mg total) by mouth 2 (two) times daily with a meal. 09/12/19   Larey Dresser, MD  diclofenac Sodium (VOLTAREN) 1 % GEL Apply 4 g topically 4 (four) times  daily. 10/23/19   Darr, Marguerita Beards, [name]  DULoxetine (CYMBALTA) 20 MG capsule TAKE 1 CAPSULE BY MOUTH ONCE DAILY 03/27/20   [name], MD  famotidine (PEPCID) 20 MG tablet TAKE 1 TABLET BY MOUTH TWO TIMES DAILY 04/18/20   [name], MD  ferrous sulfate 325 (65 FE) MG tablet Take 1 tablet (325 mg total) by mouth 2 (two) times daily with a meal. 01/14/20   Lavina Hamman, MD  furosemide (LASIX) 80 MG tablet Take 40 mg by mouth 2 (two) times daily.    [provider]  Insulin Lispro Prot & Lispro (HUMALOG MIX 75/25 KWIKPEN) (75-25) 100 UNIT/ML Kwikpen Inject 100 Units into the skin daily with breakfast. 12/20/19   Renato Shin, MD  Insulin Pen Needle 32G X 4 MM MISC 1 each by Does not apply route daily. E11.9 03/06/20   Renato Shin, MD  isosorbide mononitrate (IMDUR) 30 MG 24 hr tablet Take 30 mg by mouth daily. 01/19/20   [provider]  naloxone [facility]) nasal spray 4 mg/0.1 mL Place in nostril in the event of suspected overdose on Narcotic medication (Oxycodone) 02/16/20   [name], MD  oxyCODONE (OXY IR/ROXICODONE) 5 MG immediate release tablet Take 5 mg by mouth 4 (four) times daily as needed. 01/24/20   [provider]  polyethylene glycol (MIRALAX  / GLYCOLAX) 17 g packet Take 17 g by mouth daily as needed for moderate constipation or severe constipation. 01/14/20   Lavina Hamman, MD  potassium chloride (KLOR-CON) 10 MEQ tablet Take 10 mEq by mouth daily. 01/15/20   [provider]  predniSONE (DELTASONE) 5 MG tablet Take 5 mg by mouth daily. 11/23/19   [provider]  pregabalin (LYRICA) 100 MG capsule Take 100 mg by mouth 3 (three) times daily.     [provider]  ramelteon (ROZEREM) 8 MG tablet TAKE 1 TABLET BY MOUTH AT BEDTIME 04/18/20   [name], MD  sacubitril-valsartan (ENTRESTO) 24-26 MG Take 1 tablet by mouth 2 (two) times daily. 05/11/20   Bensimhon, Shaune Pascal, MD  Secukinumab (COSENTYX) 150 MG/ML SOSY Inject 150 mg into the skin every 28 (twenty-eight) days. Hold until told to resume by PCP 01/14/20   Lavina Hamman, MD  Tafamidis [facility]) 61 MG CAPS Take 61 mg by mouth daily. 09/08/19   Bensimhon, Shaune Pascal, MD    Allergies    Nsaids and Jardiance [empagliflozin]  Review of Systems   Review of Systems  Unable to perform ROS: Mental status change   Physical Exam Updated Vital Signs BP (!) 131/96 (BP Location: Left Arm)   Pulse (!) 125   Temp (!) 105.2 F (40.7 C) (Rectal)   Resp (!) 26   SpO2 96%   Physical Exam Vitals and nursing note reviewed.  Constitutional:      Appearance: He is ill-appearing.     Comments: Patient is somewhat lethargic. He is arousible to painful stimuli as well as to some verbal stimuli.   HENT:     Head: Normocephalic and atraumatic.     Mouth/Throat:     Mouth: Mucous membranes are dry.  Eyes:     Pupils: Pupils are equal, round, and reactive to light.  Cardiovascular:     Rate and Rhythm: Regular rhythm. Tachycardia present.     Pulses:          Dorsalis pedis pulses are 2+ on the right side and 2+ on the left side.  Pulmonary:  Effort: Tachypnea (mild) present.     Breath sounds: No wheezing or rales.     Comments: SpO2 89-95% on RA- applied 2L  via [place] for support. Does not appear to be in respiratory distress. Secretions in oropharynx were suctioned, patient protecting airway.  Abdominal:     Palpations: Abdomen is soft.     Tenderness: There is no abdominal tenderness. There is no guarding or rebound.  Musculoskeletal:     Cervical back: Neck supple. No rigidity.     Right lower leg: No edema.     Left lower leg: No edema.  Skin:    General: Skin is warm.     Comments: Hot to the touch.   Neurological:     Comments: Able to squeeze my hands bilaterally, symmetric, not consistently following commands.     ED Results / Procedures / Treatments   Labs (all labs ordered are listed, but only abnormal results are displayed) Labs Reviewed  LACTIC ACID, PLASMA - Abnormal; Notable for the following components:      Result Value   Lactic Acid, Venous 3.0 (*)    All other components within normal limits  LACTIC ACID, PLASMA - Abnormal; Notable for the following components:   Lactic Acid, Venous 2.7 (*)    All other components within normal limits  CBC WITH DIFFERENTIAL/PLATELET - Abnormal; Notable for the following components:   WBC 23.6 (*)    Hemoglobin 12.7 (*)    RDW 18.9 (*)    Neutro Abs 19.1 (*)    Monocytes Absolute 2.2 (*)    Abs Immature Granulocytes 0.56 (*)    All other components within normal limits  URINALYSIS, ROUTINE W REFLEX MICROSCOPIC - Abnormal; Notable for the following components:   APPearance TURBID (*)    Hgb urine dipstick SMALL (*)    Ketones, ur 5 (*)    Protein, ur >=300 (*)    Leukocytes,Ua MODERATE (*)    WBC, UA >50 (*)    Bacteria, UA MANY (*)    All other components within normal limits  COMPREHENSIVE METABOLIC PANEL - Abnormal; Notable for the following components:   Potassium 5.7 (*)    Glucose, Bld 142 (*)    BUN 30 (*)    Creatinine, Ser 2.33 (*)    Calcium 8.6 (*)    Albumin 2.8 (*)    GFR calc non Af Amer 28 (*)    GFR calc Af Amer 33 (*)    All other components within normal  limits  PROTIME-INR - Abnormal; Notable for the following components:   Prothrombin Time 15.3 (*)    INR 1.3 (*)    All other components within normal limits  RESPIRATORY PANEL BY RT PCR (FLU A&B, COVID)  CULTURE, BLOOD (ROUTINE X 2)  CULTURE, BLOOD (ROUTINE X 2)  URINE CULTURE  APTT  PROTIME-INR  CORTISOL-AM, BLOOD  PROCALCITONIN  CBC  COMPREHENSIVE METABOLIC PANEL  POC SARS CORONAVIRUS 2 AG -  ED    EKG EKG Interpretation  Date/Time:  Saturday May 12 2020 23:57:11 EDT Ventricular Rate:  125 PR Interval:    QRS Duration: 109 QT Interval:  333 QTC Calculation: 481 R Axis:   -10 Text Interpretation: Junctional tachycardia Confirmed by Veryl Speak [phone]) on 05/13/2020 12:46:09 AM   Radiology DG Chest Port 1 View  Result Date: 05/13/2020 CLINICAL DATA:  Fever EXAM: PORTABLE CHEST 1 VIEW COMPARISON:  May 10, 2020 FINDINGS: The heart size and mediastinal contours are unchanged with mild cardiomegaly. There  is streaky airspace opacity at the right base as the CT, likely atelectasis scarring. The left lung is clear. No acute osseous. IMPRESSION: Unchanged airspace opacity at the right base, likely atelectasis or scarring Electronically Signed   By: Prudencio Pair M.D.   On: 05/13/2020 00:34   CT Renal Stone Study  Result Date: 05/13/2020 CLINICAL DATA:  Fever, question of UTI EXAM: CT ABDOMEN AND PELVIS WITHOUT CONTRAST TECHNIQUE: Multidetector CT imaging of the abdomen and pelvis was performed following the standard protocol without IV contrast. COMPARISON:  MRI of 07/30/2020 FINDINGS: Lower chest: The visualized heart size within normal limits. There is a small pericardial effusion present. Trace bilateral pleural effusions/atelectasis is noted. Hepatobiliary: There are new heterogeneously hypodense lesions seen within the liver parenchyma. The largest in the posterior right liver lobe measures 5.7 cm. Within the anterior left liver lobe there to hypodense lesions the  largest measuring 3.4 cm. No evidence of calcified gallstones or biliary ductal dilatation. Pancreas:  Unremarkable.  No surrounding inflammatory changes. Spleen: Normal in size. Although limited due to the lack of intravenous contrast, normal in appearance. Adrenals/Urinary Tract: Both adrenal glands appear normal. Again noted is a complex collection seen posterior to the right kidney measuring 4.5 cm in transverse dimension. There is heterogeneous fluid seen within the central portion and small foci of air now noted. A punctate calcifications seen in the upper pole of the right kidney. No hydronephrosis. There is mild left-sided perinephric fat stranding changes. Within the proximal left ureter there is a 4 mm calculus present. A 2 cm low-density lesion seen off the lower pole the left kidney. Stomach/Bowel: The stomach, small bowel, and colon are normal in appearance. No inflammatory changes or obstructive findings. Scattered colonic diverticula are noted. A moderate amount of colonic stool is present. Vascular/Lymphatic: There are no enlarged abdominal or pelvic lymph nodes. Scattered aortic atherosclerotic calcifications are seen without aneurysmal dilatation. Reproductive: The prostate is unremarkable. Other: No evidence of abdominal wall mass or hernia. Musculoskeletal: No acute or significant osseous findings. IMPRESSION: Heterogeneous complex collection posterior to the right kidney now with new foci of air, however appears grossly unchanged in size from the prior exam dating back to August 2021. This likely represents of para renal abscess given the patient's history. New/enlarging hypodense lesions seen throughout the liver parenchyma which could represent hepatic abscesses versus metastatic disease. Mild left perinephric inflammatory changes, likely due to a proximal left 4 mm renal calculus. Small pericardial effusion Electronically Signed   By: Prudencio Pair M.D.   On: 05/13/2020 02:42     Procedures .Critical Care Performed by: Amaryllis Dyke, [name] Authorized by: Amaryllis Dyke, [name]    CRITICAL CARE Performed by: Kennith Maes   Total critical care time: 55 minutes  Critical care time was exclusive of separately billable procedures and treating other patients.  Critical care was necessary to treat or prevent imminent or life-threatening deterioration.  Critical care was time spent personally by me on the following activities: development of treatment plan with patient and/or surrogate as well as nursing, discussions with consultants, evaluation of patient's response to treatment, examination of patient, obtaining history from patient or surrogate, ordering and performing treatments and interventions, ordering and review of laboratory studies, ordering and review of radiographic studies, pulse oximetry and re-evaluation of patient's condition.   (including critical care time)  Medications Ordered in ED Medications  lactated ringers bolus 500 mL (has no administration in time range)  ceFEPIme (MAXIPIME) 2 g in sodium chloride  0.9 % 100 mL IVPB (has no administration in time range)  metroNIDAZOLE (FLAGYL) IVPB 500 mg (has no administration in time range)  acetaminophen (TYLENOL) suppository 650 mg (has no administration in time range)  vancomycin (VANCOREADY) IVPB 2000 mg/400 mL (has no administration in time range)    ED Course  I have reviewed the triage vital signs and the nursing notes.  Pertinent labs & imaging results that were available during my care of the patient were reviewed by me and considered in my medical decision making (see chart for details).    MDM Rules/Calculators/A&P                         Patient presents to the ED for evaluation of AMS.  Ill appearing, somewhat lethargic, responsive to painful stimuli somewhat to verbal, not following commands consistently, protecting airway at this time. Febrile to 105.2 with  likely resultant tachycardia BP initially 131/96.  Code sepsis called. I have ordered broad spectrum abx as no definitive source at this time- suspect urinary vs. pulmonary as most likely. Suppository tylenol ordered for fever, 500 cc fluid bolus ordered, no 30 cc/kg bolus initially secondary to lack of hypotension & lactic pending- will continue with small boluses in the meantime & monitor closely, hx of CHF.   Additional history obtained:  Additional history obtained from EMS. Previous records obtained and reviewed.  EKG: Tachycardic.  Lab Tests:  I Ordered, reviewed, and interpreted labs, which included:  CBC: Leukocytosis at 23.6 with left shift.  Anemia similar to prior. CMP: Acute renal failure with creatinine 2.33 and BUN 30, most recently 1.29 and 17 respectively.  Mild hyperkalemia.  LFTs are within normal limits. Lactic acid: Elevated at 3.0--> additional 500 cc LR ordered, not >4.0  Urinalysis: Consistent with infection.  Per nursing staff urine appeared purulent.  Urine culture sent. APTT: Within normal limits PT/INR: Mildly elevated  Imaging Studies ordered:  I ordered imaging studies which included chest x-ray, I independently visualized and interpreted imaging which showed Unchanged airspace opacity at the right base, likely atelectasis or scarring   01:30: BP 90 over 60s, MAPs remain > 65, given downtrending will order additional 2500 cc of LR to complete 30 cc/kg bolus (total 3500 cc).   Given patient with acute renal failure, infected urine, and prior intrarenal stones and renal abscess noted on imaging earlier this year on chart review will obtain CT renal stone study to evaluate for possible obstructive uropathy & to evaluate status of prior abscess.   CT renal stone study: radiologist impression: Heterogeneous complex collection posterior to the right kidney now with new foci of air, however appears grossly unchanged in size from the prior exam dating back to August 2021.  This likely represents of para renal abscess given the patient's history. New/enlarging hypodense lesions seen throughout the liver parenchyma which could represent hepatic abscesses versus metastatic disease. Mild left perinephric inflammatory changes, likely due to a proximal left 4 mm renal calculus. Small pericardial effusion  --> favor hepatic abscess given location adjacent to renal abscess, clinical presentation & lack of known primary malignant process.  --> with additional findings consult placed to urology.   02:45: RE-EVAL: BP remains 90s over 60s, fever is downtrending, patient's mental status is improving, he is now awake, able to answer some questions appropriately.  Patient and his wife updated on results and plan of care thus far.  02:50: CONSULT: Discussed with urologist Dr. Claudia Desanctis, will come to the  ED to see patient, will need medicine admission.   03:00: CONSULT: Discussed with hospitalist Dr. Alcario Drought- accepts admission.   03:30: Dr. Claudia Desanctis present in the ED- discussed with IR for intervention.  --> Order for temp foley placed per subsequent discussion w/ Dr. Claudia Desanctis   Sepsis - Repeat Assessment Performed at: 4:30 AM Vitals Blood pressure 97/65, pulse 80, temperature 100.3 F (37.9 C), temperature source Core (Comment), resp. rate 18, weight 106.2 kg, SpO2 97 %. Heart: Regular rate and rhythm Lungs:No obvious adventitious breath sounds Capillary Refill:<2 sec Peripheral Pulse: Radial pulse palpable Skin: Normal Color  04:35: Interventional radiologist Dr. Earleen Newport present in the ED--> patient to be taken to IR.   Blood pressure 107/71, pulse 81, temperature 99 F (37.2 C), temperature source Core (Comment), resp. rate 20, weight 106.2 kg, SpO2 96 %.  05:26: Patient taken to IR. Currently admitted to hospitalist service.   Findings and plan of care discussed with supervising physician Dr. Stark Jock who has evaluated patient as shared visit & is in agreement.   Portions of this  note were generated with [employer]. Dictation errors may occur despite best attempts at proofreading.  Final Clinical Impression(s) / ED Diagnoses Final diagnoses:  Sepsis with acute renal failure, due to unspecified organism, unspecified acute renal failure type, unspecified whether septic shock present [facility])  Ureteral stone  Renal abscess    Rx / DC Orders ED Discharge Orders    None       Amaryllis Dyke, [name] 05/13/20 0543    Veryl Speak, MD 05/14/20 [phone]

## 2020-05-13 NOTE — Assessment & Plan Note (Addendum)
-  Fever, tachycardia, tachypnea, leukocytosis, lactic acidosis, encephalopathy, source of infection considered urinary/perinephric abscess -Given vancomycin, cefepime, Flagyl on admission - Blood, urine, and perinephric abscess cultures growing E. Coli - abx de-escalated to CTX/Flagyl; await further anaerobic growth; prefer to cover for now - PCT downtrending - repeat CT on 10/5 due to worsening abdominal pain and distension

## 2020-05-13 NOTE — Procedures (Signed)
Interventional Radiology Procedure Note  Procedure: Image guided drain placement, right perinephric abscess.  64F pigtail drain.  Complications: None  EBL: None Sample: Culture sent  Recommendations: - Routine drain care, with sterile flushes, record output - follow up Cx - routine wound care  Signed,  Dulcy Fanny. Earleen Newport, DO

## 2020-05-13 NOTE — Assessment & Plan Note (Addendum)
-   right sided; seen on CT - s/p drain placement with IR on 10/3 - follow up culture results - see severe sepsis  -Follow-up repeat CT on 05/15/2020

## 2020-05-13 NOTE — Consult Note (Signed)
I have been asked to see the patient by Dr. Jake Samples, for evaluation and management of right perinephric abscess and left ureteral calculus.  History of present illness: 64 year old man with multiple medical problems including CHF, DM, CKD and RA on immunosuppression with history of right perinephric abscess initially drained in May 2021 who presented to the ER with altered mental status and clinical signs of sepsis with HR 120s-130s on arrival with a temperature of 105.  Patient is unresponsive tonight at bedside.  Patient's wife says his baseline interactive and ambulatory.  Patient has a known history of urethral stricture that is required fossa navicularis dilation last done in August 2019 by Dr. Ma Rings.  Patient was last seen in May 2021 by Dr. Junious Silk.   Review of systems: A 12 point comprehensive review of systems was obtained and is negative unless otherwise stated in the history of present illness.  Patient Active Problem List   Diagnosis Date Noted  . Renal abscess, right 05/13/2020  . Pyohydronephrosis 05/13/2020  . Vitamin B12 deficiency 02/19/2020  . Elevated LFTs   . Pleural effusion   . Weakness   . Loculated pleural effusion   . Complex renal cyst   . Urethral stricture 12/30/2019  . Nephrolithiasis 12/30/2019  . Acute febrile illness 12/30/2019  . Sepsis ([place]) 12/27/2019  . Severe sepsis ([place]) 12/10/2019  . Acute metabolic encephalopathy 47/82/9562  . Hyperkalemia 12/09/2019  . Sepsis due to Gram negative bacteria ([place]) 12/09/2019  . Rhinitis 10/16/2019  . Gastroesophageal reflux disease without esophagitis 08/31/2018  . Muscle spasm 08/31/2018  . NICM (nonischemic cardiomyopathy) ([place]) 07/06/2018  . Hypokalemia   . Acute renal failure superimposed on stage 3 chronic kidney disease ([place]) 08/27/2017  . Hyperlipidemia 12/23/2016  . Chronic combined systolic and diastolic CHF (congestive heart failure) ([place]) 09/09/2016  . Acute respiratory failure with hypoxia  ([place]) 09/02/2016  . Diabetic polyneuropathy associated with type 2 diabetes mellitus ([place]) 06/10/2016  . Vision changes 06/02/2016  . Myalgia and myositis 03/31/2016  . Shortness of breath 02/09/2016  . Chronic gouty arthritis 11/29/2015  . LBP (low back pain) 11/27/2015  . Arthralgia of multiple joints 11/27/2015  . 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[place]) 11/27/2015  . Breast pain in male 11/14/2015  . Sinusitis, chronic 10/16/2015  . Insomnia 10/16/2015  . New onset of headaches after age 50 09/20/2015  . OSA (obstructive sleep apnea) 07/12/2015  . Low serum testosterone level 05/18/2015  . Depression 05/16/2015  . Morbid obesity due to excess calories ([place])   . Chronic pain   . Type 2 diabetes mellitus with stage 3 chronic kidney disease, without long-term current use of insulin ([place])   . Rheumatoid arthritis ([place]) 02/05/2015  . CAD (coronary artery disease) 12/24/2014  . Atrial fibrillation ([place]) 12/24/2014  . Essential hypertension 12/24/2014  . Chronic kidney disease 12/24/2014  . PUD (peptic ulcer disease) 12/24/2014    No current facility-administered medications on file prior to encounter.   Current Outpatient Medications on File Prior to Encounter  Medication Sig Dispense Refill  . Accu-Chek FastClix Lancets MISC 1 each by Other route 2 (two) times daily. E11.42 204 each 0  . ACCU-CHEK GUIDE test strip USE 1 STRIP TWICE DAILY TO CHECK BLOOD GLUCOSE 100 strip 7  . albuterol (PROVENTIL HFA;VENTOLIN HFA) 108 (90 Base) MCG/ACT inhaler Inhale 2 puffs into the lungs every 6 (six) hours as needed for wheezing or shortness of breath. 1 Inhaler 0  . allopurinol (ZYLOPRIM) 100 MG tablet Take 200 mg by mouth  every evening.     [name] aspirin (EQ ASPIRIN ADULT LOW DOSE) 81 MG EC tablet TAKE 1 TABLET BY MOUTH ONCE DAILY. SWALLOW WHOLE 90 tablet 0  . atorvastatin (LIPITOR) 40 MG tablet Take 1 tablet (40 mg total) by mouth daily. 90 tablet 2  . carvedilol (COREG) 12.5 MG tablet Take 1 tablet (12.5 mg  total) by mouth 2 (two) times daily with a meal. 180 tablet 1  . diclofenac Sodium (VOLTAREN) 1 % GEL Apply 4 g topically 4 (four) times daily. 100 g 0  . DULoxetine (CYMBALTA) 20 MG capsule TAKE 1 CAPSULE BY MOUTH ONCE DAILY 90 capsule 0  . famotidine (PEPCID) 20 MG tablet TAKE 1 TABLET BY MOUTH TWO TIMES DAILY 60 tablet 2  . ferrous sulfate 325 (65 FE) MG tablet Take 1 tablet (325 mg total) by mouth 2 (two) times daily with a meal. 60 tablet 0  . furosemide (LASIX) 80 MG tablet Take 40 mg by mouth 2 (two) times daily.    . Insulin Lispro Prot & Lispro (HUMALOG MIX 75/25 KWIKPEN) (75-25) 100 UNIT/ML Kwikpen Inject 100 Units into the skin daily with breakfast. 20 pen 11  . Insulin Pen Needle 32G X 4 MM MISC 1 each by Does not apply route daily. E11.9 100 each 0  . isosorbide mononitrate (IMDUR) 30 MG 24 hr tablet Take 30 mg by mouth daily.    . naloxone (NARCAN) nasal spray 4 mg/0.1 mL Place in nostril in the event of suspected overdose on Narcotic medication (Oxycodone) 1 each 0  . oxyCODONE (OXY IR/ROXICODONE) 5 MG immediate release tablet Take 5 mg by mouth 4 (four) times daily as needed.    . polyethylene glycol (MIRALAX / GLYCOLAX) 17 g packet Take 17 g by mouth daily as needed for moderate constipation or severe constipation. 14 each 0  . potassium chloride (KLOR-CON) 10 MEQ tablet Take 10 mEq by mouth daily.    . predniSONE (DELTASONE) 5 MG tablet Take 5 mg by mouth daily.    . pregabalin (LYRICA) 100 MG capsule Take 100 mg by mouth 3 (three) times daily.     . ramelteon (ROZEREM) 8 MG tablet TAKE 1 TABLET BY MOUTH AT BEDTIME 30 tablet 1  . sacubitril-valsartan (ENTRESTO) 24-26 MG Take 1 tablet by mouth 2 (two) times daily. 60 tablet 4  . Secukinumab (COSENTYX) 150 MG/ML SOSY Inject 150 mg into the skin every 28 (twenty-eight) days. Hold until told to resume by PCP 1.96 mL   . Tafamidis (VYNDAMAX) 61 MG CAPS Take 61 mg by mouth daily. 30 capsule 11    Past Medical History:  Diagnosis  Date  . Benign colon polyp 08/01/2013   [facility] in [place]. large base tranverse colon polyp was biopsied. polyp was benign with minimal surface hyperplastic change.  . Chronic combined systolic and diastolic CHF (congestive heart failure) ([place])   . Chronic pain   . CKD (chronic kidney disease), stage III   . Diabetes mellitus without complication ([place])   . Diverticulosis   . Esophageal hiatal hernia 07/29/2013   confirmed on EGD   . Esophageal stricture   . Essential hypertension   . Gastritis 07/29/2013   confirmed on EGD, bx done an negative for intestinal metaplasia, dsyplasia or H. pylori. normal gastric emptying study done 07/13/2013.  [name] GERD (gastroesophageal reflux disease)   . Morbid obesity ([place])   . Non-obstructive CAD    a. 02/2013 Cath ([place]): nonobs dzs;  b. 09/2014 Myoview ([place]):  EF 55%, no ischemia;  c. Cath 05/2018 mild CAD no obstruction.  . Persistent atrial fibrillation ([place])    a. 02/2013 s/p rfca in [place], [place]-->prev on Xarelto, d/c'd 2/2 anemia, ? GIB.  [name] Rheumatoid arthritis ([place])   . Sigmoid diverticulosis 08/01/2013   confirmed on colonscopy. record scanned into chart    Past Surgical History:  Procedure Laterality Date  . CARDIAC CATHETERIZATION  05/2014   ablation for atrial fibrillation  . CARDIAC CATHETERIZATION N/A 11/16/2015   Procedure: Left Heart Cath and Coronary Angiography;  Surgeon: Leonie Man, MD;  Location: [facility];  Service: Cardiovascular;  Laterality: N/A;  . COLON RESECTION  09/2013   due to large, abnormal polpy. non cancerous per patient.   . COLON SURGERY  09/2014   colon resection   . IR PERC PLEURAL DRAIN W/INDWELL CATH W/IMG GUIDE  01/07/2020  . LEFT HEART CATH AND CORONARY ANGIOGRAPHY N/A 05/19/2018   Procedure: LEFT HEART CATH AND CORONARY ANGIOGRAPHY;  Surgeon: Wellington Hampshire, MD;  Location: [facility];  Service: Cardiovascular;  Laterality: N/A;    Social History   Tobacco Use  .  Smoking status: Former Smoker    Packs/day: 0.50    Years: 10.00    Pack years: 5.00    Types: Cigarettes    Quit date: 04/11/2008    Years since quitting: 12.0  . Smokeless tobacco: Never Used  Vaping Use  . Vaping Use: Never used  Substance Use Topics  . Alcohol use: No    Alcohol/week: 0.0 standard drinks  . Drug use: No    Family History  Problem Relation Age of Onset  . Hypertension Mother   . Diabetes Mother   . COPD Mother   . Lung cancer Mother        Smoker   . Hypertension Father   . Diabetes Father   . Heart Problems Father   . COPD Father   . Colon cancer Neg Hx   . Stomach cancer Neg Hx     PE: Vitals:   05/12/20 2355 05/13/20 0100 05/13/20 0115 05/13/20 0200  BP: (!) 131/96 107/69 96/66 90/61   Pulse: (!) 125 95 93 86  Resp: (!) 26 (!) 21 19 (!) 21  Temp: (!) 105.2 F (40.7 C)  (!) 100.8 F (38.2 C)   TempSrc: Rectal  Rectal   SpO2: 96% 94% 94% 95%   Patient unresponsive in in ED Atraumatic normocephalic head No audible wheezes/rhonchi Regular sinus rhythm/rate Abdomen is soft, nontender, nondistended, no CVA  GU foley draining clear yellow urine Lower extremities are symmetric without appreciable edema Grossly neurologically intact No identifiable skin lesions  Recent Labs    05/13/20 0010  WBC 23.6*  HGB 12.7*  HCT 39.8   Recent Labs    05/13/20 0054  NA 141  K 5.7*  CL 102  CO2 28  GLUCOSE 142*  BUN 30*  CREATININE 2.33*  CALCIUM 8.6*   Recent Labs    05/13/20 0054  INR 1.3*   No results for input(s): LABURIN in the last 72 hours. Results for orders placed or performed during the hospital encounter of 05/12/20  Respiratory Panel by RT PCR (Flu A&B, Covid) - Nasopharyngeal Swab     Status: None   Collection Time: 05/13/20  1:26 AM   Specimen: Nasopharyngeal Swab  Result Value Ref Range Status   SARS Coronavirus 2 by RT PCR NEGATIVE NEGATIVE Final    Comment: (NOTE) SARS-CoV-2 target nucleic  acids are NOT  DETECTED.  The SARS-CoV-2 RNA is generally detectable in upper respiratoy specimens during the acute phase of infection. The lowest concentration of SARS-CoV-2 viral copies this assay can detect is 131 copies/mL. A negative result does not preclude SARS-Cov-2 infection and should not be used as the sole basis for treatment or other patient management decisions. A negative result may occur with  improper specimen collection/handling, submission of specimen other than nasopharyngeal swab, presence of viral mutation(s) within the areas targeted by this assay, and inadequate number of viral copies (<131 copies/mL). A negative result must be combined with clinical observations, patient history, and epidemiological information. The expected result is Negative.  Fact Sheet for Patients:  PinkCheek.be  Fact Sheet for Healthcare Providers:  GravelBags.it  This test is no t yet approved or cleared by the [place] FDA and  has been authorized for detection and/or diagnosis of SARS-CoV-2 by FDA under an Emergency Use Authorization (EUA). This EUA will remain  in effect (meaning this test can be used) for the duration of the COVID-19 declaration under Section 564(b)(1) of the Act, 21 U.S.C. section 360bbb-3(b)(1), unless the authorization is terminated or revoked sooner.     Influenza A by PCR NEGATIVE NEGATIVE Final   Influenza B by PCR NEGATIVE NEGATIVE Final    Comment: (NOTE) The Xpert Xpress SARS-CoV-2/FLU/RSV assay is intended as an aid in  the diagnosis of influenza from Nasopharyngeal swab specimens and  should not be used as a sole basis for treatment. Nasal washings and  aspirates are unacceptable for Xpert Xpress SARS-CoV-2/FLU/RSV  testing.  Fact Sheet for Patients: PinkCheek.be  Fact Sheet for Healthcare Providers: GravelBags.it  This test is not yet  approved or cleared by the [place] FDA and  has been authorized for detection and/or diagnosis of SARS-CoV-2 by  FDA under an Emergency Use Authorization (EUA). This EUA will remain  in effect (meaning this test can be used) for the duration of the  Covid-19 declaration under Section 564(b)(1) of the Act, 21  U.S.C. section 360bbb-3(b)(1), unless the authorization is  terminated or revoked. Performed at [facility], [place] [address], [place], [place] [address]     Imaging: CT Abd/Pelvis 05/13/20 IMPRESSION: Heterogeneous complex collection posterior to the right kidney now with new foci of air, however appears grossly unchanged in size from the prior exam dating back to August 2021. This likely represents of para renal abscess given the patient's history.  New/enlarging hypodense lesions seen throughout the liver parenchyma which could represent hepatic abscesses versus metastatic disease.  Mild left perinephric inflammatory changes, likely due to a proximal left 4 mm renal calculus.  Small pericardial effusion  Assessment/Plan: 64 year old man with multiple medical problems who presented to the ED with altered mental status found to have worsening right perinephric abscess with air as well as possible liver abscesses and 4 mm left proximal ureteral calculus with perinephric stranding with clinical signs of sepsis.  1. RIGHT perinephric abscess: recommend interventional radiology consultation for drainage of right perinephric abscess that has new foci of air with possible spread to liver; called IR and discussed case with Dr. Earleen Newport who was agreeable given clinical state  2. LEFT 4 mm proximal ureteral calculus: consideration of left percutaneous nephrostomy tube if he does not clinically improve after abscess drainage on contralateral side; patient not stable enough for ureteral stent placement in ED  3. Urethral stricture: Foley catheter for maximal  decompression  Thank you for involving me in this  patient's care.  Please page with any further questions or concerns. Rithik Odea D Annelies Coyt

## 2020-05-13 NOTE — Assessment & Plan Note (Signed)
-   continue SSI and CBG monitoring  

## 2020-05-13 NOTE — Progress Notes (Signed)
PHARMACY - PHYSICIAN COMMUNICATION CRITICAL VALUE ALERT - BLOOD CULTURE IDENTIFICATION (BCID)  Marco [name] is an 64 y.o. male who presented to [facility] on 05/12/2020 with a chief complaint of AMS  Assessment: Pt currently on empiric antibiotics for sepsis. PMH significant for UTI w/perinephric abscess requiring hospital admission in April/May 2021. BCID now + for 2/4 GNR; identified as E.coli, no resistance detected.  Name of physician (or Provider) Contacted: Dr. Sabino Gasser  Current antibiotics: Cefepime, vancomycin, metronidazole  Changes to prescribed antibiotics recommended: Recommend narrowing to CTX 2 g IV daily + metronidazole for potential anaerobic coverage for abscess.   Results for orders placed or performed during the hospital encounter of 05/12/20  Blood Culture ID Panel (Reflexed) (Collected: 05/13/2020 12:10 AM)  Result Value Ref Range   Enterococcus faecalis NOT DETECTED NOT DETECTED   Enterococcus Faecium NOT DETECTED NOT DETECTED   Listeria monocytogenes NOT DETECTED NOT DETECTED   Staphylococcus species NOT DETECTED NOT DETECTED   Staphylococcus aureus (BCID) NOT DETECTED NOT DETECTED   Staphylococcus epidermidis NOT DETECTED NOT DETECTED   Staphylococcus lugdunensis NOT DETECTED NOT DETECTED   Streptococcus species NOT DETECTED NOT DETECTED   Streptococcus agalactiae NOT DETECTED NOT DETECTED   Streptococcus pneumoniae NOT DETECTED NOT DETECTED   Streptococcus pyogenes NOT DETECTED NOT DETECTED   A.calcoaceticus-baumannii NOT DETECTED NOT DETECTED   Bacteroides fragilis NOT DETECTED NOT DETECTED   Enterobacterales DETECTED (A) NOT DETECTED   Enterobacter cloacae complex NOT DETECTED NOT DETECTED   Escherichia coli DETECTED (A) NOT DETECTED   Klebsiella aerogenes NOT DETECTED NOT DETECTED   Klebsiella oxytoca NOT DETECTED NOT DETECTED   Klebsiella pneumoniae NOT DETECTED NOT DETECTED   Proteus species NOT DETECTED NOT DETECTED   Salmonella species NOT  DETECTED NOT DETECTED   Serratia marcescens NOT DETECTED NOT DETECTED   Haemophilus influenzae NOT DETECTED NOT DETECTED   Neisseria meningitidis NOT DETECTED NOT DETECTED   Pseudomonas aeruginosa NOT DETECTED NOT DETECTED   Stenotrophomonas maltophilia NOT DETECTED NOT DETECTED   Candida albicans NOT DETECTED NOT DETECTED   Candida auris NOT DETECTED NOT DETECTED   Candida glabrata NOT DETECTED NOT DETECTED   Candida krusei NOT DETECTED NOT DETECTED   Candida parapsilosis NOT DETECTED NOT DETECTED   Candida tropicalis NOT DETECTED NOT DETECTED   Cryptococcus neoformans/gattii NOT DETECTED NOT DETECTED   CTX-M ESBL NOT DETECTED NOT DETECTED   Carbapenem resistance IMP NOT DETECTED NOT DETECTED   Carbapenem resistance KPC NOT DETECTED NOT DETECTED   Carbapenem resistance NDM NOT DETECTED NOT DETECTED   Carbapenem resist OXA 48 LIKE NOT DETECTED NOT DETECTED   Carbapenem resistance VIM NOT DETECTED NOT DETECTED    Lenis Noon, PharmD 05/13/2020  5:08 PM

## 2020-05-13 NOTE — ED Triage Notes (Signed)
Pt came in via EMS with c/o weakness, fever,and malodorous urine. Pt temp on arrival is 105.2. Heart rate is 120 to 130. Pt is somnolent. Awakens to voice occasionally.

## 2020-05-13 NOTE — ED Notes (Signed)
Pt had incontinent small loose bm. Pt cleaned and full linen changed. Pt repositioned. Pt provided with three warm blankets and call bell.

## 2020-05-13 NOTE — Assessment & Plan Note (Signed)
-  Typically alert and oriented.  Encephalopathy considered in setting of underlying infection

## 2020-05-13 NOTE — Assessment & Plan Note (Signed)
-  Holding Coreg in setting of infection -Aspirin on hold in case of need for further interventions

## 2020-05-13 NOTE — Progress Notes (Signed)
A consult was received from an ED physician for Vancomycin and Cefepime per pharmacy dosing.  The patient's profile has been reviewed for ht/wt/allergies/indication/available labs.    Last weight in Epic = 106 kg July 2021  A one time order has been placed for Vancomycin 2gm and Cefepime 2gm IV x 1 dose each.    Further antibiotics/pharmacy consults should be ordered by admitting physician if indicated.                       Thank you, Everette Rank, PharmD 05/13/2020  12:04 AM

## 2020-05-13 NOTE — ED Notes (Signed)
Pt. In radiology@05 :26am.

## 2020-05-13 NOTE — Progress Notes (Signed)
Pharmacy Antibiotic Note  Marco [name] is a 64 y.o. male admitted on 05/12/2020 with UTI with r/o sepsis.  PMH significant for CHF, DM, CKD, RA, right perinephric abscess. Pharmacy has been consulted for Cefepime dosing.  Plan: Cefepime 2gm IV q12h F/U weight once documented in Epic and confirm Cefepime dosing F/Ul culture results and sensitivities     Temp (24hrs), Avg:103 F (39.4 C), Min:100.8 F (38.2 C), Max:105.2 F (40.7 C)  Recent Labs  Lab 05/13/20 0010 05/13/20 0054  WBC 23.6*  --   CREATININE  --  2.33*  LATICACIDVEN 3.0*  --     CrCl cannot be calculated (Unknown ideal weight.).    Allergies  Allergen Reactions   Nsaids Other (See Comments)    Stomach pains. Ulcers - stated by patient    Jardiance [Empagliflozin] Nausea And Vomiting    Antimicrobials this admission: 10/3 Vanc x 1 10/3 Cefepime >>     Dose adjustments this admission:    Microbiology results: 10/3 BCx:   10/3 UCx:    10/3 Influenza A: neg 10/3 Influenza B: neg 10/3 Covid: neg   Thank you for allowing pharmacy to be a part of this patients care.  Everette Rank, PharmD 05/13/2020 3:53 AM

## 2020-05-13 NOTE — Assessment & Plan Note (Signed)
-  Holding BP meds in setting of severe sepsis

## 2020-05-13 NOTE — Hospital Course (Addendum)
[name] is a 64 yo AA male with PMH DMII, HTN, dCHF (EF most recently 50-55% in May, grade 1 DD), RA on biologic therapy.  Patient was admitted in April / May most recently.  Had sepsis, hypotension at that time secondary to UTI with a R perinephric abscess (3.9 cm at that time).  Drainage of perinephric abscess in late May grew out E.Coli sensitive to cefepime, rocephin.  He had an MRI abdomen to follow up in Aug which demonstrated worsening of the R paranephric abscess (see MR result from that time).   Patient presents to the ED with AMS, patient not able to provide much in the way of history due to lethargy.  In the ER he was febrile, 105.2 and developed hypotension.  WBC 23.6, Creat up to 2.3 (baseline 1.3).  UA consistent with infection CT abd/pelvis revealed right perinephric abscess and left renal calculus.  Urology and IR were consulted. He underwent placement of right perc drain to the perinephric abscess with 49F pigtail drain on 05/13/20.  Blood and urine cultures were obtained on admission as well as culture from perinephric abscess which was sent after drain placement. He was initially treated with vancomycin, cefepime, Flagyl in the ER and continued on cefepime and flagyl while cultures further developed. Blood, urine, and perinephric abscess cultures all returned positive with E. coli.  Antibiotics were deescalated down to Rocephin and Flagyl while awaiting further anaerobic culture results.  Overnight of 05/14/2020 he had some chest pain and shortness of breath/wheezing. He underwent work-up with EKG, troponin. His EKG had revealed sinus tach with PVCs. Trop was elevated however has been elevated in the past; it was considered in setting of sepsis and demand ischemia.  He had worsening abdominal pain on 10/5 and also underwent repeat CT abd/pelvis.

## 2020-05-13 NOTE — Assessment & Plan Note (Addendum)
-  Unclear etiology, but possible contribution from outlet obstruction versus severe sepsis -Patient may still need left-sided percutaneous nephrostomy tube -Continue LR and monitor renal function -Follow-up repeat CT

## 2020-05-13 NOTE — Assessment & Plan Note (Signed)
-  On outpatient treatment -See sepsis.  Was started on steroids as patient is on chronic prednisone 5 mg daily at home as well

## 2020-05-13 NOTE — ED Notes (Signed)
Pt's drain assessed on R side. Occlusive dressing and 10 french catheter. Serosanguinous fluid present in tubing but none currently in drain.

## 2020-05-13 NOTE — Assessment & Plan Note (Signed)
-   continue tele - no indication for treatment at this time - BMP in am

## 2020-05-13 NOTE — Assessment & Plan Note (Signed)
-  CT reveals a 4 mm left-sided ureteral calculus.  Per urology, patient may still need percutaneous nephrostomy tube -Continue to monitor renal response to fluids and current interventions in place as well as further plans per urology

## 2020-05-13 NOTE — Consult Note (Signed)
Chief Complaint: Sepsis  Referring Physician(s): Marco [name]  Supervising Physician: Marco [name]  Patient Status: [facility] - In-pt  History of Present Illness: Marco [name] is a 64 y.o. male presenting via EMS to the [facility] ED, accompanied by his wife, for decreased mental status and decreased appetite.   Marco [name] wife provides the history, given her husband's obtunded state. She tells me that he has been ambulatory and acting normal until the last 24-48 hours.  He has been increasingly tired with decreased appetite.  She has noticed that he has started becoming "hot and cold" episodically, and has had foul smelling urine.  She recognizes he has had these symptoms previously when he had an abscess drained, and she ultimately called EMS for some help with care.   He has history of prior right super-infected renal cyst drained 01/01/20.  The culture yield was e coli.  It seems the drain was removed before his discharge on 01/14/20. Follow up MRI performed 03/22/20 shows that he drain is no longer present, and that there are changes concerning for perinephric abscess at that time.  His wife says that they did not have a care plan established for this findings.   He is currently obtunded in the ED, although with some stimulus will respond and answer.  He knows his name, DOB, and location.  He can tell me the month and year accurately.   His SBP is 94.    CT performed tonight is non contrast, given his acute on chronic kidney problem.  This shows phlegmon in the right posterior perinephric space.  There is no gas, but likely fat necrosis.  The phlegmon is in the region of the previous drain.  There is now apparent extension to the liver, with low density involving posterior margin of segment 6.  This is non diagnostic CT, and the possibility is edema or abscess of liver, either subcapsular or parenchymal.    WBC = 23.6 Lactate was 3.0 on arrival, down to 2.7  He is a code sepsis in the ED,  receiving ABX.    Additional findings of concern on the CT: Mixed density foci within the left liver  Non diagnostic, though potentially additional sites of abscess. Particularly of the deep margin behind segment 2/3.  He has a left ureteral stone.  No significant hydronephrosis at this time.    Past Medical History:  Diagnosis Date  . Benign colon polyp 08/01/2013   [place] in [place]. large base tranverse colon polyp was biopsied. polyp was benign with minimal surface hyperplastic change.  . Chronic combined systolic and diastolic CHF (congestive heart failure) ([place])   . Chronic pain   . CKD (chronic kidney disease), stage III   . Diabetes mellitus without complication ([place])   . Diverticulosis   . Esophageal hiatal hernia 07/29/2013   confirmed on EGD   . Esophageal stricture   . Essential hypertension   . Gastritis 07/29/2013   confirmed on EGD, bx done an negative for intestinal metaplasia, dsyplasia or H. pylori. normal gastric emptying study done 07/13/2013.  [name] GERD (gastroesophageal reflux disease)   . Morbid obesity ([place])   . Non-obstructive CAD    a. 02/2013 Cath ([place]): nonobs dzs;  b. 09/2014 Myoview ([place]): EF 55%, no ischemia;  c. Cath 05/2018 mild CAD no obstruction.  . Persistent atrial fibrillation ([place])    a. 02/2013 s/p rfca in [place], [place]-->prev on Xarelto, d/c'd 2/2 anemia, ? GIB.  [name] Rheumatoid arthritis (  [place])   . Sigmoid diverticulosis 08/01/2013   confirmed on colonscopy. record scanned into chart    Past Surgical History:  Procedure Laterality Date  . CARDIAC CATHETERIZATION  05/2014   ablation for atrial fibrillation  . CARDIAC CATHETERIZATION N/A 11/16/2015   Procedure: Left Heart Cath and Coronary Angiography;  Surgeon: Leonie Man, MD;  Location: [place] [facility];  Service: Cardiovascular;  Laterality: N/A;  . COLON RESECTION  09/2013   due to large, abnormal polpy. non cancerous per patient.   . COLON SURGERY  09/2014   colon  resection   . IR PERC PLEURAL DRAIN W/INDWELL CATH W/IMG GUIDE  01/07/2020  . LEFT HEART CATH AND CORONARY ANGIOGRAPHY N/A 05/19/2018   Procedure: LEFT HEART CATH AND CORONARY ANGIOGRAPHY;  Surgeon: Wellington Hampshire, MD;  Location: [place] [facility];  Service: Cardiovascular;  Laterality: N/A;    Allergies: Nsaids and Jardiance [empagliflozin]  Medications: Prior to Admission medications   Medication Sig Start Date End Date Taking? Authorizing Provider  Accu-Chek FastClix Lancets MISC 1 each by Other route 2 (two) times daily. E11.42 03/27/20   Renato Shin, MD  ACCU-CHEK GUIDE test strip USE 1 STRIP TWICE DAILY TO CHECK BLOOD GLUCOSE 02/13/20   Renato Shin, MD  albuterol (PROVENTIL HFA;VENTOLIN HFA) 108 (90 Base) MCG/ACT inhaler Inhale 2 puffs into the lungs every 6 (six) hours as needed for wheezing or shortness of breath. 04/29/18   [name], MD  allopurinol (ZYLOPRIM) 100 MG tablet Take 200 mg by mouth every evening.     [provider]  aspirin (EQ ASPIRIN ADULT LOW DOSE) 81 MG EC tablet TAKE 1 TABLET BY MOUTH ONCE DAILY. SWALLOW WHOLE 02/29/20   [name], MD  atorvastatin (LIPITOR) 40 MG tablet Take 1 tablet (40 mg total) by mouth daily. 09/27/19   Sueanne Margarita, MD  carvedilol (COREG) 12.5 MG tablet Take 1 tablet (12.5 mg total) by mouth 2 (two) times daily with a meal. 09/12/19   Larey Dresser, MD  diclofenac Sodium (VOLTAREN) 1 % GEL Apply 4 g topically 4 (four) times daily. 10/23/19   Darr, Marguerita Beards, [name]  DULoxetine (CYMBALTA) 20 MG capsule TAKE 1 CAPSULE BY MOUTH ONCE DAILY 03/27/20   [name], MD  famotidine (PEPCID) 20 MG tablet TAKE 1 TABLET BY MOUTH TWO TIMES DAILY 04/18/20   [name], MD  ferrous sulfate 325 (65 FE) MG tablet Take 1 tablet (325 mg total) by mouth 2 (two) times daily with a meal. 01/14/20   Lavina Hamman, MD  furosemide (LASIX) 80 MG tablet Take 40 mg by mouth 2 (two) times daily.    [provider]  Insulin  Lispro Prot & Lispro (HUMALOG MIX 75/25 KWIKPEN) (75-25) 100 UNIT/ML Kwikpen Inject 100 Units into the skin daily with breakfast. 12/20/19   Renato Shin, MD  Insulin Pen Needle 32G X 4 MM MISC 1 each by Does not apply route daily. E11.9 03/06/20   Renato Shin, MD  isosorbide mononitrate (IMDUR) 30 MG 24 hr tablet Take 30 mg by mouth daily. 01/19/20   [provider]  naloxone [facility]) nasal spray 4 mg/0.1 mL Place in nostril in the event of suspected overdose on Narcotic medication (Oxycodone) 02/16/20   [name], MD  oxyCODONE (OXY IR/ROXICODONE) 5 MG immediate release tablet Take 5 mg by mouth 4 (four) times daily as needed. 01/24/20   [provider]  polyethylene glycol (MIRALAX / GLYCOLAX) 17 g packet  Take 17 g by mouth daily as needed for moderate constipation or severe constipation. 01/14/20   Lavina Hamman, MD  potassium chloride (KLOR-CON) 10 MEQ tablet Take 10 mEq by mouth daily. 01/15/20   [provider]  predniSONE (DELTASONE) 5 MG tablet Take 5 mg by mouth daily. 11/23/19   [provider]  pregabalin (LYRICA) 100 MG capsule Take 100 mg by mouth 3 (three) times daily.     [provider]  ramelteon (ROZEREM) 8 MG tablet TAKE 1 TABLET BY MOUTH AT BEDTIME 04/18/20   [name], MD  sacubitril-valsartan (ENTRESTO) 24-26 MG Take 1 tablet by mouth 2 (two) times daily. 05/11/20   Bensimhon, Shaune Pascal, MD  Secukinumab (COSENTYX) 150 MG/ML SOSY Inject 150 mg into the skin every 28 (twenty-eight) days. Hold until told to resume by PCP 01/14/20   Lavina Hamman, MD  Tafamidis [facility]) 61 MG CAPS Take 61 mg by mouth daily. 09/08/19   Bensimhon, Shaune Pascal, MD     Family History  Problem Relation Age of Onset  . Hypertension Mother   . Diabetes Mother   . COPD Mother   . Lung cancer Mother        Smoker   . Hypertension Father   . Diabetes Father   . Heart Problems Father   . COPD Father   . Colon cancer Neg Hx   . Stomach cancer Neg Hx      Social History   Socioeconomic History  . Marital status: Married    Spouse name: Amethyst  . Number of children: 8  . Years of education: 90  . Highest education level: Some college, no degree  Occupational History    Comment: disabled  Tobacco Use  . Smoking status: Former Smoker    Packs/day: 0.50    Years: 10.00    Pack years: 5.00    Types: Cigarettes    Quit date: 04/11/2008    Years since quitting: 12.0  . Smokeless tobacco: Never Used  Vaping Use  . Vaping Use: Never used  Substance and Sexual Activity  . Alcohol use: No    Alcohol/week: 0.0 standard drinks  . Drug use: No  . Sexual activity: Yes    Partners: Female    Birth control/protection: None  Other Topics Concern  . Not on file  Social History Narrative   Lives with wife. Does not work.  On disability.    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 Provided crisis assistance program information to assist with bills       Has issues getting food- gets $36/month in food stamps- already has list of food pantries but has not tried them- encouraged pt to try food pantries and reach out to clinic for help if needed   Social Determinants of Health   Financial Resource Strain:   . Difficulty of Paying Living Expenses: Not on file  Food Insecurity:   . Worried About Charity fundraiser in the Last Year: Not on file  . Ran Out of Food in the Last Year: Not on file  Transportation Needs:   . Lack of Transportation (Medical): Not on file  . Lack of Transportation (Non-Medical): Not on file  Physical Activity:   . Days of Exercise per Week: Not  on file  . Minutes of Exercise per Session: Not on file  Stress:   . Feeling of Stress : Not on file  Social Connections:   . Frequency of Communication with Friends and Family: Not on file   . Frequency of Social Gatherings with Friends and Family: Not on file  . Attends Religious Services: Not on file  . Active Member of Clubs or Organizations: Not on file  . Attends Archivist Meetings: Not on file  . Marital Status: Not on file     Review of Systems: A 12 point ROS discussed and pertinent positives are indicated in the HPI above.  All other systems are negative.  Review of Systems  Vital Signs: BP 97/65 (BP Location: Left Arm)   Pulse 80   Temp 100.3 F (37.9 C) (Core (Comment))   Resp 18   Wt 106.2 kg   SpO2 97%   BMI 37.79 kg/m   Physical Exam General: 64 yo male appearing stated age.  Ill appearing HEENT: Atraumatic, normocephalic.   Oral mucosa tacky.  Chest/Lungs:  Tachypneic, symmetric chest with inspiration/expiration.  [name]  Heart:    No JVD appreciated.  Abdomen:  Obese, soft  Genito-urinary: Deferred Neurologic: Obtunded and arousable.      Imaging: DG Chest Port 1 View  Result Date: 05/13/2020 CLINICAL DATA:  Fever EXAM: PORTABLE CHEST 1 VIEW COMPARISON:  May 10, 2020 FINDINGS: The heart size and mediastinal contours are unchanged with mild cardiomegaly. There is streaky airspace opacity at the right base as the CT, likely atelectasis scarring. The left lung is clear. No acute osseous. IMPRESSION: Unchanged airspace opacity at the right base, likely atelectasis or scarring Electronically Signed   By: Prudencio Pair M.D.   On: 05/13/2020 00:34   CT Renal Stone Study  Result Date: 05/13/2020 CLINICAL DATA:  Fever, question of UTI EXAM: CT ABDOMEN AND PELVIS WITHOUT CONTRAST TECHNIQUE: Multidetector CT imaging of the abdomen and pelvis was performed following the standard protocol without IV contrast. COMPARISON:  MRI of 07/30/2020 FINDINGS: Lower chest: The visualized heart size within normal limits. There is a small pericardial effusion present. Trace bilateral pleural effusions/atelectasis is noted. Hepatobiliary: There are new  heterogeneously hypodense lesions seen within the liver parenchyma. The largest in the posterior right liver lobe measures 5.7 cm. Within the anterior left liver lobe there to hypodense lesions the largest measuring 3.4 cm. No evidence of calcified gallstones or biliary ductal dilatation. Pancreas:  Unremarkable.  No surrounding inflammatory changes. Spleen: Normal in size. Although limited due to the lack of intravenous contrast, normal in appearance. Adrenals/Urinary Tract: Both adrenal glands appear normal. Again noted is a complex collection seen posterior to the right kidney measuring 4.5 cm in transverse dimension. There is heterogeneous fluid seen within the central portion and small foci of air now noted. A punctate calcifications seen in the upper pole of the right kidney. No hydronephrosis. There is mild left-sided perinephric fat stranding changes. Within the proximal left ureter there is a 4 mm calculus present. A 2 cm low-density lesion seen off the lower pole the left kidney. Stomach/Bowel: The stomach, small bowel, and colon are normal in appearance. No inflammatory changes or obstructive findings. Scattered colonic diverticula are noted. A moderate amount of colonic stool is present. Vascular/Lymphatic: There are no enlarged abdominal or pelvic lymph nodes. Scattered aortic atherosclerotic calcifications are seen without aneurysmal dilatation. Reproductive: The prostate is unremarkable. Other: No evidence of abdominal wall mass or hernia. Musculoskeletal: No acute or  significant osseous findings. IMPRESSION: Heterogeneous complex collection posterior to the right kidney now with new foci of air, however appears grossly unchanged in size from the prior exam dating back to August 2021. This likely represents of para renal abscess given the patient's history. New/enlarging hypodense lesions seen throughout the liver parenchyma which could represent hepatic abscesses versus metastatic disease. Mild left  perinephric inflammatory changes, likely due to a proximal left 4 mm renal calculus. Small pericardial effusion Electronically Signed   By: Prudencio Pair M.D.   On: 05/13/2020 02:42    Labs:  CBC: Recent Labs    01/12/20 0254 01/13/20 0855 02/16/20 1218 05/13/20 0010  WBC 11.8* 12.2* 13.0* 23.6*  HGB 7.6* 8.0* 12.1* 12.7*  HCT 24.3* 25.5* 37.2* 39.8  PLT 479* 501* 357 387    COAGS: Recent Labs    12/08/19 2014 12/30/19 1106 05/13/20 0054  INR 1.3* 1.2 1.3*  APTT 33  --  31    BMP: Recent Labs    01/12/20 0254 01/13/20 0855 02/16/20 1218 05/13/20 0054  NA 138 139 143 141  K 4.3 4.1 4.2 5.7*  CL 103 102 97 102  CO2 27 28 30* 28  GLUCOSE 95 103* 166* 142*  BUN 15 12 17  30*  CALCIUM 8.1* 8.2* 9.3 8.6*  CREATININE 1.32* 1.42* 1.29* 2.33*  GFRNONAA 57* 52* 58* 28*  GFRAA >60 >60 67 33*    LIVER FUNCTION TESTS: Recent Labs    01/06/20 0621 01/13/20 0855 02/16/20 1218 05/13/20 0054  BILITOT 0.6 0.5 0.2 1.2  AST 44* 25 9 20   ALT 89* 65* 14 23  ALKPHOS 69 72 75 59  PROT 6.8 6.6 8.1 7.7  ALBUMIN 2.0* 1.8* 4.0 2.8*    TUMOR MARKERS: No results for input(s): AFPTM, CEA, CA199, CHROMGRNA in the last 8760 hours.  Assessment and Plan:  Marco Poffenberger is 64 yo male with acute sepsis, code sepsis in the [facility] ED.    His most likely source of infection is persistence of a right perinephric abscess, drained in May and persisting on MRI in August.  This previously grew e coli.   Additional concerns are new liver lesions, particularly at the posterior segment 6 inseparable from the perinephric process, possible additional abscess, as well as involving segment 2/3, possible abscess.    Finally, he has a new left ureteral stone, which is confounding the picture, as he has very minimal hydro on the left.  It is possible that his acute on chronic kidney injury has limited urine production and we are simply not seeing hydro..  I think this will need very close follow up.     Given his clinical sepsis, our plan is to proceed with attempted drainage of the right perinephric phlegmon/abscess.    I have discussed this with his wife.  Risks include: bleeding, infection, need for further procedure, local injury, need for additional procedure/surgery, including additional drainage, worsening of the infection, organ dysfunction, cardiopulmonary collapse, death.   She understands and has signed consent on his behalf.    Plan: -Plan for image guided drain of the perinephric collection.  -Recommend additional diagnostic imaging of the liver, to assess for possible liver abscess.  This might be MRI without contrast, vs liver US. -Recommend interval imaging of the left kidney as his resuscitation progresses, as he might develop hydro.  This might be repeat non-contrast CT or [place].     Electronically Signed: Corrie Mckusick, DO 05/13/2020, 5:04 AM   I spent a total  of 67 Mi[place]utes    in face to face in clinical consultation, greater than 50% of which was counseling/coordinating care for right perinephric collection, possible drainage.

## 2020-05-13 NOTE — H&P (Addendum)
History and Physical    Marco [name] XBJ:[id number] DOB: December 08, 1955 DOA: 05/12/2020  PCP: [name], MD  Patient coming from: Home  I have personally briefly reviewed patient's old medical records in [facility]  Chief Complaint: AMS  HPI: Marco [name] is a 64 y.o. male with medical history significant of DM2, HTN, CHF (EF most recently 50-55% in May, grade 1 DD), RA on biologic therapy.  Pt was admitted in April / May most recently.  Had sepsis, hypotension at that time secondary to UTI with a R perinephric abscess (3.9cm at that time).  Drainage of perinephric abscess in late May grew out E.Coli sensitive to cefepime, rocephin.  Looks like he had an MRI abdomen to follow up in Aug which demonstrated worsening of the R paranephric abscess (see MR result from that time).  Patient presents to the ED with AMS, patient not able to provide much in the way of history due to lethargy.  Pt's wife notes he has had ongoing back / flank pain since discharge in May.  Patient at baseline is AAOx3, looks like he was this as well on 10/1 when he had a virtual visit with [name]: "Says he is doing ok. Main issues are leg and back pain. No edema, orthopnea or PND. No CP. Breathing improved. Only SOB up steps or carrying things."   ED Course: Tm 105.2 on presentation, has improved to 100.8 most recently  BP unfortunatly has trended down from 131/96 to 80/60 despite ongoing IVF.  Under 2L out of 3L bolus given at this time.  WBC 23.6k.  Pt is vaccinated to COVID Building services engineer).  Creat 2.3 up from 1.3 baseline.  UA is grossly infected urine.  CT reveals: 1) progression of R renal abscess, now has gas 2) new hypodense liver lesions, abscess vs mets (im suspecting abscesses). 3) mild L hydro with small 64mm obstructing stone  Urology consulted, hospitalist asked to admit.   Review of Systems: Unable to perform due to AMS  Past Medical History:  Diagnosis Date  .  Benign colon polyp 08/01/2013   [facility] in [place]. large base tranverse colon polyp was biopsied. polyp was benign with minimal surface hyperplastic change.  . Chronic combined systolic and diastolic CHF (congestive heart failure) ([place])   . Chronic pain   . CKD (chronic kidney disease), stage III   . Diabetes mellitus without complication ([place])   . Diverticulosis   . Esophageal hiatal hernia 07/29/2013   confirmed on EGD   . Esophageal stricture   . Essential hypertension   . Gastritis 07/29/2013   confirmed on EGD, bx done an negative for intestinal metaplasia, dsyplasia or H. pylori. normal gastric emptying study done 07/13/2013.  [name] GERD (gastroesophageal reflux disease)   . Morbid obesity ([place])   . Non-obstructive CAD    a. 02/2013 Cath ([place]): nonobs dzs;  b. 09/2014 Myoview ([place]): EF 55%, no ischemia;  c. Cath 05/2018 mild CAD no obstruction.  . Persistent atrial fibrillation ([place])    a. 02/2013 s/p rfca in [place], [place]-->prev on Xarelto, d/c'd 2/2 anemia, ? GIB.  [name] Rheumatoid arthritis ([place])   . Sigmoid diverticulosis 08/01/2013   confirmed on colonscopy. record scanned into chart    Past Surgical History:  Procedure Laterality Date  . CARDIAC CATHETERIZATION  05/2014   ablation for atrial fibrillation  . CARDIAC CATHETERIZATION N/A 11/16/2015   Procedure: Left Heart Cath and Coronary Angiography;  Surgeon: Leonie Man,  MD;  Location: [facility];  Service: Cardiovascular;  Laterality: N/A;  . COLON RESECTION  09/2013   due to large, abnormal polpy. non cancerous per patient.   . COLON SURGERY  09/2014   colon resection   . IR PERC PLEURAL DRAIN W/INDWELL CATH W/IMG GUIDE  01/07/2020  . LEFT HEART CATH AND CORONARY ANGIOGRAPHY N/A 05/19/2018   Procedure: LEFT HEART CATH AND CORONARY ANGIOGRAPHY;  Surgeon: Wellington Hampshire, MD;  Location: [facility];  Service: Cardiovascular;  Laterality: N/A;     reports that he quit smoking about 12 years  ago. His smoking use included cigarettes. He has a 5.00 pack-year smoking history. He has never used smokeless tobacco. He reports that he does not drink alcohol and does not use drugs.  Allergies  Allergen Reactions  . Nsaids Other (See Comments)    Stomach pains. Ulcers - stated by patient   . Jardiance [Empagliflozin] Nausea And Vomiting    Family History  Problem Relation Age of Onset  . Hypertension Mother   . Diabetes Mother   . COPD Mother   . Lung cancer Mother        Smoker   . Hypertension Father   . Diabetes Father   . Heart Problems Father   . COPD Father   . Colon cancer Neg Hx   . Stomach cancer Neg Hx      Prior to Admission medications   Medication Sig Start Date End Date Taking? Authorizing Provider  Accu-Chek FastClix Lancets MISC 1 each by Other route 2 (two) times daily. E11.42 03/27/20   Renato Shin, MD  ACCU-CHEK GUIDE test strip USE 1 STRIP TWICE DAILY TO CHECK BLOOD GLUCOSE 02/13/20   Renato Shin, MD  albuterol (PROVENTIL HFA;VENTOLIN HFA) 108 (90 Base) MCG/ACT inhaler Inhale 2 puffs into the lungs every 6 (six) hours as needed for wheezing or shortness of breath. 04/29/18   [name], MD  allopurinol (ZYLOPRIM) 100 MG tablet Take 200 mg by mouth every evening.     [provider]  aspirin (EQ ASPIRIN ADULT LOW DOSE) 81 MG EC tablet TAKE 1 TABLET BY MOUTH ONCE DAILY. SWALLOW WHOLE 02/29/20   [name], MD  atorvastatin (LIPITOR) 40 MG tablet Take 1 tablet (40 mg total) by mouth daily. 09/27/19   Sueanne Margarita, MD  carvedilol (COREG) 12.5 MG tablet Take 1 tablet (12.5 mg total) by mouth 2 (two) times daily with a meal. 09/12/19   Larey Dresser, MD  diclofenac Sodium (VOLTAREN) 1 % GEL Apply 4 g topically 4 (four) times daily. 10/23/19   Darr, Marguerita Beards, [name]  DULoxetine (CYMBALTA) 20 MG capsule TAKE 1 CAPSULE BY MOUTH ONCE DAILY 03/27/20   [name], MD  famotidine (PEPCID) 20 MG tablet TAKE 1 TABLET BY MOUTH TWO TIMES DAILY  04/18/20   [name], MD  ferrous sulfate 325 (65 FE) MG tablet Take 1 tablet (325 mg total) by mouth 2 (two) times daily with a meal. 01/14/20   Lavina Hamman, MD  furosemide (LASIX) 80 MG tablet Take 40 mg by mouth 2 (two) times daily.    [provider]  Insulin Lispro Prot & Lispro (HUMALOG MIX 75/25 KWIKPEN) (75-25) 100 UNIT/ML Kwikpen Inject 100 Units into the skin daily with breakfast. 12/20/19   Renato Shin, MD  Insulin Pen Needle 32G X 4 MM MISC 1 each by Does not apply route daily. E11.9 03/06/20   Renato Shin, MD  isosorbide mononitrate (IMDUR) 30 MG 24 hr tablet Take 30 mg by mouth daily. 01/19/20   [provider]  naloxone [facility]) nasal spray 4 mg/0.1 mL Place in nostril in the event of suspected overdose on Narcotic medication (Oxycodone) 02/16/20   [name], MD  oxyCODONE (OXY IR/ROXICODONE) 5 MG immediate release tablet Take 5 mg by mouth 4 (four) times daily as needed. 01/24/20   [provider]  polyethylene glycol (MIRALAX / GLYCOLAX) 17 g packet Take 17 g by mouth daily as needed for moderate constipation or severe constipation. 01/14/20   Lavina Hamman, MD  potassium chloride (KLOR-CON) 10 MEQ tablet Take 10 mEq by mouth daily. 01/15/20   [provider]  predniSONE (DELTASONE) 5 MG tablet Take 5 mg by mouth daily. 11/23/19   [provider]  pregabalin (LYRICA) 100 MG capsule Take 100 mg by mouth 3 (three) times daily.     [provider]  ramelteon (ROZEREM) 8 MG tablet TAKE 1 TABLET BY MOUTH AT BEDTIME 04/18/20   [name], MD  sacubitril-valsartan (ENTRESTO) 24-26 MG Take 1 tablet by mouth 2 (two) times daily. 05/11/20   Bensimhon, Shaune Pascal, MD  Secukinumab (COSENTYX) 150 MG/ML SOSY Inject 150 mg into the skin every 28 (twenty-eight) days. Hold until told to resume by PCP 01/14/20   Lavina Hamman, MD  Tafamidis [facility]) 61 MG CAPS Take 61 mg by mouth daily. 09/08/19   Bensimhon, Shaune Pascal, MD     Physical Exam: Vitals:   05/13/20 0316 05/13/20 0326 05/13/20 0327 05/13/20 0330  BP:   (!) 89/56 92/82  Pulse: 80 81 80 81  Resp: 19 19 19 20   Temp:      TempSrc:      SpO2: 95%   95%    Constitutional: NAD, calm, comfortable Eyes: PERRL, lids and conjunctivae normal ENMT: Mucous membranes are moist. Posterior pharynx clear of any exudate or lesions.Normal dentition.  Neck: normal, supple, no masses, no thyromegaly Respiratory: clear to auscultation bilaterally, no wheezing, no crackles. Normal respiratory effort. No accessory muscle use.  Cardiovascular: Regular rate and rhythm, no murmurs / rubs / gallops. No extremity edema. 2+ pedal pulses. No carotid bruits.  Abdomen: no tenderness, no masses palpated. No hepatosplenomegaly. Bowel sounds positive.  Musculoskeletal: no clubbing / cyanosis. No joint deformity upper and lower extremities. Good ROM, no contractures. Normal muscle tone.  Skin: no rashes, lesions, ulcers. No induration Neurologic: CN 2-12 grossly intact. Sensation intact, DTR normal. Strength 5/5 in all 4.  Psychiatric: Lethargic, does open eyes and communicate but goes back to sleep   Labs on Admission: I have personally reviewed following labs and imaging studies  CBC: Recent Labs  Lab 05/13/20 0010  WBC 23.6*  NEUTROABS 19.1*  HGB 12.7*  HCT 39.8  MCV 88.4  PLT 623   Basic Metabolic Panel: Recent Labs  Lab 05/13/20 0054  NA 141  K 5.7*  CL 102  CO2 28  GLUCOSE 142*  BUN 30*  CREATININE 2.33*  CALCIUM 8.6*   GFR: CrCl cannot be calculated (Unknown ideal weight.). Liver Function Tests: Recent Labs  Lab 05/13/20 0054  AST 20  ALT 23  ALKPHOS 59  BILITOT 1.2  PROT 7.7  ALBUMIN 2.8*   No results for input(s): LIPASE, AMYLASE in the last 168 hours. No results for input(s): AMMONIA in the last 168 hours. Coagulation Profile: Recent Labs  Lab 05/13/20 0054  INR 1.3*   Cardiac Enzymes: No results for input(s):  CKTOTAL, CKMB,  CKMBINDEX, TROPONINI in the last 168 hours. BNP (last 3 results) No results for input(s): PROBNP in the last 8760 hours. HbA1C: No results for input(s): HGBA1C in the last 72 hours. CBG: No results for input(s): GLUCAP in the last 168 hours. Lipid Profile: No results for input(s): CHOL, HDL, LDLCALC, TRIG, CHOLHDL, LDLDIRECT in the last 72 hours. Thyroid Function Tests: No results for input(s): TSH, T4TOTAL, FREET4, T3FREE, THYROIDAB in the last 72 hours. Anemia Panel: No results for input(s): VITAMINB12, FOLATE, FERRITIN, TIBC, IRON, RETICCTPCT in the last 72 hours. Urine analysis:    Component Value Date/Time   COLORURINE YELLOW 05/13/2020 0054   APPEARANCEUR TURBID (A) 05/13/2020 0054   LABSPEC 1.015 05/13/2020 0054   PHURINE 5.0 05/13/2020 0054   GLUCOSEU NEGATIVE 05/13/2020 0054   HGBUR SMALL (A) 05/13/2020 0054   BILIRUBINUR NEGATIVE 05/13/2020 0054   BILIRUBINUR negative 10/20/2018 1533   BILIRUBINUR negative 01/29/2018 1620   KETONESUR 5 (A) 05/13/2020 0054   PROTEINUR >=300 (A) 05/13/2020 0054   UROBILINOGEN 0.2 10/20/2018 1533   UROBILINOGEN 0.2 04/14/2015 1932   NITRITE NEGATIVE 05/13/2020 0054   LEUKOCYTESUR MODERATE (A) 05/13/2020 0054    Radiological Exams on Admission: DG Chest Port 1 View  Result Date: 05/13/2020 CLINICAL DATA:  Fever EXAM: PORTABLE CHEST 1 VIEW COMPARISON:  May 10, 2020 FINDINGS: The heart size and mediastinal contours are unchanged with mild cardiomegaly. There is streaky airspace opacity at the right base as the CT, likely atelectasis scarring. The left lung is clear. No acute osseous. IMPRESSION: Unchanged airspace opacity at the right base, likely atelectasis or scarring Electronically Signed   By: Prudencio Pair M.D.   On: 05/13/2020 00:34   CT Renal Stone Study  Result Date: 05/13/2020 CLINICAL DATA:  Fever, question of UTI EXAM: CT ABDOMEN AND PELVIS WITHOUT CONTRAST TECHNIQUE: Multidetector CT imaging of the abdomen and pelvis was  performed following the standard protocol without IV contrast. COMPARISON:  MRI of 07/30/2020 FINDINGS: Lower chest: The visualized heart size within normal limits. There is a small pericardial effusion present. Trace bilateral pleural effusions/atelectasis is noted. Hepatobiliary: There are new heterogeneously hypodense lesions seen within the liver parenchyma. The largest in the posterior right liver lobe measures 5.7 cm. Within the anterior left liver lobe there to hypodense lesions the largest measuring 3.4 cm. No evidence of calcified gallstones or biliary ductal dilatation. Pancreas:  Unremarkable.  No surrounding inflammatory changes. Spleen: Normal in size. Although limited due to the lack of intravenous contrast, normal in appearance. Adrenals/Urinary Tract: Both adrenal glands appear normal. Again noted is a complex collection seen posterior to the right kidney measuring 4.5 cm in transverse dimension. There is heterogeneous fluid seen within the central portion and small foci of air now noted. A punctate calcifications seen in the upper pole of the right kidney. No hydronephrosis. There is mild left-sided perinephric fat stranding changes. Within the proximal left ureter there is a 4 mm calculus present. A 2 cm low-density lesion seen off the lower pole the left kidney. Stomach/Bowel: The stomach, small bowel, and colon are normal in appearance. No inflammatory changes or obstructive findings. Scattered colonic diverticula are noted. A moderate amount of colonic stool is present. Vascular/Lymphatic: There are no enlarged abdominal or pelvic lymph nodes. Scattered aortic atherosclerotic calcifications are seen without aneurysmal dilatation. Reproductive: The prostate is unremarkable. Other: No evidence of abdominal wall mass or hernia. Musculoskeletal: No acute or significant osseous findings. IMPRESSION: Heterogeneous complex collection posterior to the right kidney  now with new foci of air, however  appears grossly unchanged in size from the prior exam dating back to August 2021. This likely represents of para renal abscess given the patient's history. New/enlarging hypodense lesions seen throughout the liver parenchyma which could represent hepatic abscesses versus metastatic disease. Mild left perinephric inflammatory changes, likely due to a proximal left 4 mm renal calculus. Small pericardial effusion Electronically Signed   By: Prudencio Pair M.D.   On: 05/13/2020 02:42    EKG: Independently reviewed.  Assessment/Plan Principal Problem:   Severe sepsis (HCC) Active Problems:   Atrial fibrillation (HCC)   Essential hypertension   Rheumatoid arthritis ([place])   Type 2 diabetes mellitus with stage 3 chronic kidney disease, without long-term current use of insulin (HCC)   Chronic combined systolic and diastolic CHF (congestive heart failure) (HCC)   Acute renal failure superimposed on stage 3 chronic kidney disease ([place])   Acute metabolic encephalopathy   Sepsis due to Gram negative bacteria (HCC)   Nephrolithiasis   Renal abscess, right   Pyohydronephrosis    1. Severe sepsis with hypotension and AKI and AMS - 1. Urinary source, also has R paranephric abscess with gas and L obstructing ureteral stone with mild hydro.  Also suspect liver lesions seen on CT are probably abscesses. 2. Sepsis pathway 3. Getting 3L IVF bolus to start with, then LR at 125 (or may need more boluses) 4. Trying stress dose steroids given chronic steroid use 5. Got cefepime / flagyl / vanc in ED, will just leave him on cefepime for now given obvious urinary source (E.Coli if I had to guess given history) 6. See urology consult note: they feel he is too ill to go to OR for the moment, IR consulted and IR coming to see pt for: 1. Perc drainage of R perinephric abscess 2. Perc nephrostomy tube in L ureter 3. Not necessarily at the same time or in that order (timing and ordering TBD by IR) 4. Also drainage /  biopsy of liver lesions (I strongly suspect these are also abscesses) if needed (TBD by IR) 7. Cultures pending, urine and blood 2. AKI on CKD 3- 1. Strict intake and output 2. Trend creat 3. IVF as above 4. Renal interventions as above 3. RA - 1. Holding immunosuppressives 2. Putting on stress dose steroids as above 4. HTN - hold home BP meds 5. DM2 - 1. Sensitive SSI Q4H 6. Encephalopathy - 1. Due to #1 above 7. Chronic CHF - 1. Holding home meds (AMS and NPO anyhow) 2. Looks like just grade 1 DD at this point 3. Reduced EF in past but low normal EF as of earlier this year. 4. Some concern for cardiac amyloid in past, but [name] doesn't think so as of his note 2 days ago, was even mentioning taking patient off of tafamadis 5. 2019 cath - non-occlusive dz, no s/s angina as of 10/1 8. H/o A.Fib - 1. Once in 2017, no recurrence 2. Previously on Xarelto, stopped due to anemia and unknown blood loss 3. CHADS-vasc = 4  DVT prophylaxis: Lovenox - wrote to start at 10am Code Status: Full Family Communication: Wife at bedside Disposition Plan: TBD, pt critically ill at this point Consults called: Urology, IR Admission status: Admit to inpatient  Severity of Illness: The appropriate patient status for this patient is INPATIENT. Inpatient status is judged to be reasonable and necessary in order to provide the required intensity of service to ensure the patient's safety. The  patient's presenting symptoms, physical exam findings, and initial radiographic and laboratory data in the context of their chronic comorbidities is felt to place them at high risk for further clinical deterioration. Furthermore, it is not anticipated that the patient will be medically stable for discharge from the hospital within 2 midnights of admission. The following factors support the patient status of inpatient.   IP status for severe sepsis with hypotension, AKI, AMS, needs intervention as described in  detail above.   * I certify that at the point of admission it is my clinical judgment that the patient will require inpatient hospital care spanning beyond 2 midnights from the point of admission due to high intensity of service, high risk for further deterioration and high frequency of surveillance required.*    Marielena Harvell M. DO [facility]  How to contact the [facility] Attending or Consulting provider [place] or covering provider during after hours [place], for this patient?  1. Check the care team in [facility] and look for a) attending/consulting [facility] provider listed and b) the [facility] team listed 2. Log into [email or web address]  Amion Physician Scheduling and messaging for groups and whole hospitals  On call and physician scheduling software for group practices, residents, hospitalists and other medical providers for call, clinic, rotation and shift schedules. On[employer] is a hospital-wide system for scheduling doctors and paging doctors on call. EasyPlot is for scientific plotting and data analysis.  [email or web address]  and use [place]'s universal password to access. If you do not have the password, please contact the hospital operator.  3. Locate the [facility] provider you are looking for under [facility] and page to a number that you can be directly reached. 4. If you still have difficulty reaching the provider, please page the [facility] (Director on Call) for the Hospitalists listed on amion for assistance.  05/13/2020, 3:47 AM

## 2020-05-14 ENCOUNTER — Encounter (HOSPITAL_COMMUNITY): Payer: Self-pay | Admitting: Internal Medicine

## 2020-05-14 DIAGNOSIS — A419 Sepsis, unspecified organism: Secondary | ICD-10-CM | POA: Diagnosis not present

## 2020-05-14 DIAGNOSIS — R6520 Severe sepsis without septic shock: Secondary | ICD-10-CM | POA: Diagnosis not present

## 2020-05-14 LAB — CBC WITH DIFFERENTIAL/PLATELET
Abs Immature Granulocytes: 0.2 10*3/uL — ABNORMAL HIGH (ref 0.00–0.07)
Basophils Absolute: 0 10*3/uL (ref 0.0–0.1)
Basophils Relative: 0 %
Eosinophils Absolute: 0 10*3/uL (ref 0.0–0.5)
Eosinophils Relative: 0 %
HCT: 31.3 % — ABNORMAL LOW (ref 39.0–52.0)
Hemoglobin: 10 g/dL — ABNORMAL LOW (ref 13.0–17.0)
Immature Granulocytes: 1 %
Lymphocytes Relative: 4 %
Lymphs Abs: 0.9 10*3/uL (ref 0.7–4.0)
MCH: 28.7 pg (ref 26.0–34.0)
MCHC: 31.9 g/dL (ref 30.0–36.0)
MCV: 89.9 fL (ref 80.0–100.0)
Monocytes Absolute: 1.4 10*3/uL — ABNORMAL HIGH (ref 0.1–1.0)
Monocytes Relative: 6 %
Neutro Abs: 21.6 10*3/uL — ABNORMAL HIGH (ref 1.7–7.7)
Neutrophils Relative %: 89 %
Platelets: 286 10*3/uL (ref 150–400)
RBC: 3.48 MIL/uL — ABNORMAL LOW (ref 4.22–5.81)
RDW: 18.1 % — ABNORMAL HIGH (ref 11.5–15.5)
WBC: 24.2 10*3/uL — ABNORMAL HIGH (ref 4.0–10.5)
nRBC: 0 % (ref 0.0–0.2)

## 2020-05-14 LAB — BASIC METABOLIC PANEL
Anion gap: 8 (ref 5–15)
BUN: 30 mg/dL — ABNORMAL HIGH (ref 8–23)
CO2: 23 mmol/L (ref 22–32)
Calcium: 7.6 mg/dL — ABNORMAL LOW (ref 8.9–10.3)
Chloride: 108 mmol/L (ref 98–111)
Creatinine, Ser: 1.8 mg/dL — ABNORMAL HIGH (ref 0.61–1.24)
GFR calc Af Amer: 45 mL/min — ABNORMAL LOW (ref >60)
GFR calc non Af Amer: 39 mL/min — ABNORMAL LOW (ref >60)
Glucose, Bld: 154 mg/dL — ABNORMAL HIGH (ref 70–99)
Potassium: 4.5 mmol/L (ref 3.5–5.1)
Sodium: 139 mmol/L (ref 135–145)

## 2020-05-14 LAB — PHOSPHORUS: Phosphorus: 3.3 mg/dL (ref 2.5–4.6)

## 2020-05-14 LAB — PROCALCITONIN: Procalcitonin: 11.37 ng/mL

## 2020-05-14 LAB — MAGNESIUM: Magnesium: 2 mg/dL (ref 1.7–2.4)

## 2020-05-14 MED ORDER — HYDRALAZINE HCL 10 MG PO TABS
10.0000 mg | ORAL_TABLET | Freq: Three times a day (TID) | ORAL | Status: DC | PRN
  Administered 2020-05-14: 10 mg via ORAL
  Filled 2020-05-14: qty 1

## 2020-05-14 MED ORDER — CHLORHEXIDINE GLUCONATE CLOTH 2 % EX PADS
6.0000 | MEDICATED_PAD | Freq: Every day | CUTANEOUS | Status: DC
  Administered 2020-05-15 – 2020-05-23 (×7): 6 via TOPICAL

## 2020-05-14 MED ORDER — POLYVINYL ALCOHOL 1.4 % OP SOLN
1.0000 [drp] | OPHTHALMIC | Status: DC | PRN
  Administered 2020-05-16 – 2020-05-24 (×10): 1 [drp] via OPHTHALMIC
  Filled 2020-05-14 (×2): qty 15

## 2020-05-14 NOTE — Progress Notes (Signed)
Urology Inpatient Progress Report      Intv/Subj: No acute events overnight. Patient still in ED waiting for bed.  He is more interactive today.    Principal Problem:   Severe sepsis ([place]) Active Problems:   Atrial fibrillation ([place])   Essential hypertension   Rheumatoid arthritis ([place])   Type 2 diabetes mellitus with stage 3 chronic kidney disease, without long-term current use of insulin (HCC)   Chronic combined systolic and diastolic CHF (congestive heart failure) (HCC)   Acute renal failure superimposed on stage 3 chronic kidney disease (HCC)   Acute metabolic encephalopathy   Hyperkalemia   Nephrolithiasis   Perinephric abscess   Pyohydronephrosis  Current Facility-Administered Medications  Medication Dose Route Frequency Provider Last Rate Last Admin  . acetaminophen (TYLENOL) tablet 650 mg  650 mg Oral Q6H PRN Etta Quill, DO   650 mg at 05/13/20 2119   Or  . acetaminophen (TYLENOL) suppository 650 mg  650 mg Rectal Q6H PRN Etta Quill, DO      . cefTRIAXone (ROCEPHIN) 2 g in sodium chloride 0.9 % 100 mL IVPB  2 g Intravenous Q24H Dwyane Dee, MD   Paused at 05/13/20 1848  . enoxaparin (LOVENOX) injection 40 mg  40 mg Subcutaneous Q24H Jennette Kettle M, DO   40 mg at 05/14/20 0929  . hydrocortisone sodium succinate (SOLU-CORTEF) 100 MG injection 50 mg  50 mg Intravenous Q6H Jennette Kettle M, DO   50 mg at 05/14/20 0930  . lactated ringers infusion   Intravenous Continuous Etta Quill, DO 125 mL/hr at 05/14/20 1131 New Bag at 05/14/20 1131  . metroNIDAZOLE (FLAGYL) IVPB 500 mg  500 mg Intravenous Lenise Arena, MD   Paused at 05/14/20 1039  . ondansetron (ZOFRAN) tablet 4 mg  4 mg Oral Q6H PRN Etta Quill, DO       Or  . ondansetron [facility]) injection 4 mg  4 mg Intravenous Q6H PRN Etta Quill, DO      . oxyCODONE (Oxy IR/ROXICODONE) immediate release tablet 5 mg  5 mg Oral Q6H PRN Rise Patience, MD   5 mg at 05/14/20 1002  . sodium  chloride flush (NS) 0.9 % injection 5 mL  5 mL Intracatheter Q8H Corrie Mckusick, DO   5 mL at 05/13/20 1329   Current Outpatient Medications  Medication Sig Dispense Refill  . albuterol (PROVENTIL HFA;VENTOLIN HFA) 108 (90 Base) MCG/ACT inhaler Inhale 2 puffs into the lungs every 6 (six) hours as needed for wheezing or shortness of breath. 1 Inhaler 0  . allopurinol (ZYLOPRIM) 100 MG tablet Take 200 mg by mouth every evening.     [name] aspirin (EQ ASPIRIN ADULT LOW DOSE) 81 MG EC tablet TAKE 1 TABLET BY MOUTH ONCE DAILY. SWALLOW WHOLE (Patient taking differently: Take 81 mg by mouth daily. TAKE 1 TABLET BY MOUTH ONCE DAILY. SWALLOW WHOLE) 90 tablet 0  . atorvastatin (LIPITOR) 40 MG tablet Take 1 tablet (40 mg total) by mouth daily. 90 tablet 2  . carvedilol (COREG) 12.5 MG tablet Take 1 tablet (12.5 mg total) by mouth 2 (two) times daily with a meal. 180 tablet 1  . cyclobenzaprine (FLEXERIL) 5 MG tablet Take 5 mg by mouth every 12 (twelve) hours.    . diclofenac Sodium (VOLTAREN) 1 % GEL Apply 4 g topically 4 (four) times daily. (Patient taking differently: Apply 4 g topically 4 (four) times daily as needed (pain). ) 100 g 0  . DULoxetine (CYMBALTA) 20  MG capsule TAKE 1 CAPSULE BY MOUTH ONCE DAILY (Patient taking differently: Take 20 mg by mouth daily. ) 90 capsule 0  . famotidine (PEPCID) 20 MG tablet TAKE 1 TABLET BY MOUTH TWO TIMES DAILY (Patient taking differently: Take 20 mg by mouth 2 (two) times daily. ) 60 tablet 2  . ferrous sulfate 325 (65 FE) MG tablet Take 1 tablet (325 mg total) by mouth 2 (two) times daily with a meal. 60 tablet 0  . furosemide (LASIX) 80 MG tablet Take 80 mg by mouth daily.     . Insulin Lispro Prot & Lispro (HUMALOG MIX 75/25 KWIKPEN) (75-25) 100 UNIT/ML Kwikpen Inject 100 Units into the skin daily with breakfast. 20 pen 11  . isosorbide mononitrate (IMDUR) 30 MG 24 hr tablet Take 30 mg by mouth daily.    . naloxone (NARCAN) nasal spray 4 mg/0.1 mL Place in nostril in  the event of suspected overdose on Narcotic medication (Oxycodone) 1 each 0  . oxyCODONE (OXY IR/ROXICODONE) 5 MG immediate release tablet Take 5 mg by mouth 3 (three) times daily as needed for moderate pain.     . polyethylene glycol (MIRALAX / GLYCOLAX) 17 g packet Take 17 g by mouth daily as needed for moderate constipation or severe constipation. 14 each 0  . Potassium Chloride ER 20 MEQ TBCR Take 20 mEq by mouth daily.     . predniSONE (DELTASONE) 5 MG tablet Take 5 mg by mouth daily.    . pregabalin (LYRICA) 100 MG capsule Take 100 mg by mouth 3 (three) times daily.     . ramelteon (ROZEREM) 8 MG tablet TAKE 1 TABLET BY MOUTH AT BEDTIME (Patient taking differently: Take 8 mg by mouth at bedtime. ) 30 tablet 1  . Secukinumab (COSENTYX) 150 MG/ML SOSY Inject 150 mg into the skin every 28 (twenty-eight) days. Hold until told to resume by PCP 1.96 mL   . Tafamidis (VYNDAMAX) 61 MG CAPS Take 61 mg by mouth daily. 30 capsule 11  . Accu-Chek FastClix Lancets MISC 1 each by Other route 2 (two) times daily. E11.42 204 each 0  . ACCU-CHEK GUIDE test strip USE 1 STRIP TWICE DAILY TO CHECK BLOOD GLUCOSE 100 strip 7  . Insulin Pen Needle 32G X 4 MM MISC 1 each by Does not apply route daily. E11.9 100 each 0  . sacubitril-valsartan (ENTRESTO) 24-26 MG Take 1 tablet by mouth 2 (two) times daily. 60 tablet 4     Objective: Vital: Vitals:   05/14/20 1000 05/14/20 1042 05/14/20 1100 05/14/20 1200  BP: (!) 141/69 (!) 141/69 (!) 146/74 (!) 143/76  Pulse: 76 72 72 69  Resp: 18 (!) 25 16 16   Temp:  99.5 F (37.5 C)    TempSrc:  Oral    SpO2: 96% 95% 96% 94%  Weight:       I/Os: I/O last 3 completed shifts: In: 23 [Other:5; IV Piggyback:1000] Out: -   Physical Exam:  General: Patient is in no apparent distress Lungs: Normal respiratory effort, chest expands symmetrically; on 2L nasal cannula GI: The abdomen is soft and mildly ttp RUQ and LUQ Perinephric drain with serosanguinous  drainage Foley: clear concentrated urine  Ext: lower extremities symmetric  Lab Results: Recent Labs    05/13/20 0010 05/13/20 0700 05/14/20 0415  WBC 23.6* 27.1* 24.2*  HGB 12.7* 10.1* 10.0*  HCT 39.8 32.2* 31.3*   Recent Labs    05/13/20 0054 05/13/20 0700 05/14/20 0509  NA 141 139 139  K  5.7* 5.4* 4.5  CL 102 104 108  CO2 28 23 23   GLUCOSE 142* 162* 154*  BUN 30* 31* 30*  CREATININE 2.33* 2.46* 1.80*  CALCIUM 8.6* 7.9* 7.6*   Recent Labs    05/13/20 0054 05/13/20 0700  INR 1.3* 1.4*   No results for input(s): LABURIN in the last 72 hours. Results for orders placed or performed during the hospital encounter of 05/12/20  Blood Culture (routine x 2)     Status: Abnormal (Preliminary result)   Collection Time: 05/13/20 12:10 AM   Specimen: BLOOD  Result Value Ref Range Status   Specimen Description   Final    BLOOD RIGHT ANTECUBITAL Performed at [facility] [address] [place], [place] [address]    Special Requests   Final    BOTTLES DRAWN AEROBIC AND ANAEROBIC Blood Culture adequate volume Performed at [facility] [address] [place], [place] [address]    Culture  Setup Time   Final    GRAM NEGATIVE RODS IN BOTH AEROBIC AND ANAEROBIC BOTTLES Organism ID to follow CRITICAL RESULT CALLED TO, READ BACK BY AND VERIFIED WITH: Shelda Jakes PHARMD, AT 1704 05/13/20 BY Rush Landmark Performed at [facility], [place] [address], [place], [place] [address]    Culture ESCHERICHIA COLI (A)  Final   Report Status PENDING  Incomplete  Blood Culture ID Panel (Reflexed)     Status: Abnormal   Collection Time: 05/13/20 12:10 AM  Result Value Ref Range Status   Enterococcus faecalis NOT DETECTED NOT DETECTED Final   Enterococcus Faecium NOT DETECTED NOT DETECTED Final   Listeria monocytogenes NOT DETECTED NOT DETECTED Final   Staphylococcus species NOT DETECTED NOT DETECTED Final   Staphylococcus aureus (BCID) NOT DETECTED  NOT DETECTED Final   Staphylococcus epidermidis NOT DETECTED NOT DETECTED Final   Staphylococcus lugdunensis NOT DETECTED NOT DETECTED Final   Streptococcus species NOT DETECTED NOT DETECTED Final   Streptococcus agalactiae NOT DETECTED NOT DETECTED Final   Streptococcus pneumoniae NOT DETECTED NOT DETECTED Final   Streptococcus pyogenes NOT DETECTED NOT DETECTED Final   A.calcoaceticus-baumannii NOT DETECTED NOT DETECTED Final   Bacteroides fragilis NOT DETECTED NOT DETECTED Final   Enterobacterales DETECTED (A) NOT DETECTED Final    Comment: Enterobacterales represent a large order of gram negative bacteria, not a single organism. CRITICAL RESULT CALLED TO, READ BACK BY AND VERIFIED WITH: Shelda Jakes PHARMD, AT 1704 05/13/20 BY D. VANHOOK    Enterobacter cloacae complex NOT DETECTED NOT DETECTED Final   Escherichia coli DETECTED (A) NOT DETECTED Final    Comment: CRITICAL RESULT CALLED TO, READ BACK BY AND VERIFIED WITH: Shelda Jakes PHARMD, AT 1704 05/13/20 BY D. VANHOOK    Klebsiella aerogenes NOT DETECTED NOT DETECTED Final   Klebsiella oxytoca NOT DETECTED NOT DETECTED Final   Klebsiella pneumoniae NOT DETECTED NOT DETECTED Final   Proteus species NOT DETECTED NOT DETECTED Final   Salmonella species NOT DETECTED NOT DETECTED Final   Serratia marcescens NOT DETECTED NOT DETECTED Final   Haemophilus influenzae NOT DETECTED NOT DETECTED Final   Neisseria meningitidis NOT DETECTED NOT DETECTED Final   Pseudomonas aeruginosa NOT DETECTED NOT DETECTED Final   Stenotrophomonas maltophilia NOT DETECTED NOT DETECTED Final   Candida albicans NOT DETECTED NOT DETECTED Final   Candida auris NOT DETECTED NOT DETECTED Final   Candida glabrata NOT DETECTED NOT DETECTED Final   Candida krusei NOT DETECTED NOT DETECTED Final   Candida parapsilosis NOT DETECTED NOT  DETECTED Final   Candida tropicalis NOT DETECTED NOT DETECTED Final   Cryptococcus neoformans/gattii NOT DETECTED NOT DETECTED Final    CTX-M ESBL NOT DETECTED NOT DETECTED Final   Carbapenem resistance IMP NOT DETECTED NOT DETECTED Final   Carbapenem resistance KPC NOT DETECTED NOT DETECTED Final   Carbapenem resistance NDM NOT DETECTED NOT DETECTED Final   Carbapenem resist OXA 48 LIKE NOT DETECTED NOT DETECTED Final   Carbapenem resistance VIM NOT DETECTED NOT DETECTED Final    Comment: Performed at [facility], [place] [address] [place], [place] [address]  Blood Culture (routine x 2)     Status: None (Preliminary result)   Collection Time: 05/13/20 12:54 AM   Specimen: BLOOD LEFT FOREARM  Result Value Ref Range Status   Specimen Description   Final    BLOOD LEFT FOREARM Performed at [facility] [address] [place], [place] [address]    Special Requests   Final    BOTTLES DRAWN AEROBIC AND ANAEROBIC Blood Culture results may not be optimal due to an excessive volume of blood received in culture bottles Performed at [facility] [address], [place], [place] [address]    Culture   Final    NO GROWTH 1 DAY Performed at [facility], [place] [address] [place], [place] [address]    Report Status PENDING  Incomplete  Urine culture     Status: Abnormal (Preliminary result)   Collection Time: 05/13/20 12:54 AM   Specimen: In/Out Cath Urine  Result Value Ref Range Status   Specimen Description   Final    IN/OUT CATH URINE Performed at [facility] [address], [place], [place] [address]    Special Requests   Final    NONE Performed at [facility], [place] [address] [place], [place] [address]    Culture (A)  Final    >=100,000 COLONIES/mL ESCHERICHIA COLI SUSCEPTIBILITIES TO FOLLOW Performed at [facility], [place] [address], [place], [place] [address]    Report Status PENDING  Incomplete  Respiratory Panel by RT PCR (Flu A&B, Covid) - Nasopharyngeal Swab     Status: None   Collection Time: 05/13/20  1:26 AM    Specimen: Nasopharyngeal Swab  Result Value Ref Range Status   SARS Coronavirus 2 by RT PCR NEGATIVE NEGATIVE Final    Comment: (NOTE) SARS-CoV-2 target nucleic acids are NOT DETECTED.  The SARS-CoV-2 RNA is generally detectable in upper respiratoy specimens during the acute phase of infection. The lowest concentration of SARS-CoV-2 viral copies this assay can detect is 131 copies/mL. A negative result does not preclude SARS-Cov-2 infection and should not be used as the sole basis for treatment or other patient management decisions. A negative result may occur with  improper specimen collection/handling, submission of specimen other than nasopharyngeal swab, presence of viral mutation(s) within the areas targeted by this assay, and inadequate number of viral copies (<131 copies/mL). A negative result must be combined with clinical observations, patient history, and epidemiological information. The expected result is Negative.  Fact Sheet for Patients:  PinkCheek.be  Fact Sheet for Healthcare Providers:  GravelBags.it  This test is no t yet approved or cleared by the [place] FDA and  has been authorized for detection and/or diagnosis of SARS-CoV-2 by FDA under an Emergency Use Authorization (EUA). This EUA will remain  in effect (meaning this test can be used) for the duration of the COVID-19 declaration under  Section 564(b)(1) of the Act, 21 U.S.C. section 360bbb-3(b)(1), unless the authorization is terminated or revoked sooner.     Influenza A by PCR NEGATIVE NEGATIVE Final   Influenza B by PCR NEGATIVE NEGATIVE Final    Comment: (NOTE) The Xpert Xpress SARS-CoV-2/FLU/RSV assay is intended as an aid in  the diagnosis of influenza from Nasopharyngeal swab specimens and  should not be used as a sole basis for treatment. Nasal washings and  aspirates are unacceptable for Xpert Xpress SARS-CoV-2/FLU/RSV   testing.  Fact Sheet for Patients: PinkCheek.be  Fact Sheet for Healthcare Providers: GravelBags.it  This test is not yet approved or cleared by the [place] FDA and  has been authorized for detection and/or diagnosis of SARS-CoV-2 by  FDA under an Emergency Use Authorization (EUA). This EUA will remain  in effect (meaning this test can be used) for the duration of the  Covid-19 declaration under Section 564(b)(1) of the Act, 21  U.S.C. section 360bbb-3(b)(1), unless the authorization is  terminated or revoked. Performed at [facility], [place] [address] [place], [place] [address]   Aerobic/Anaerobic Culture (surgical/deep wound)     Status: None (Preliminary result)   Collection Time: 05/13/20  6:20 AM   Specimen: Abdomen; Abscess  Result Value Ref Range Status   Specimen Description   Final    ABDOMEN Performed at [facility] [address], [place], [place] [address]    Special Requests   Final    NONE Performed at [facility], [place] [address], [place], [place] [address]    Gram Stain   Final    ABUNDANT WBC PRESENT, PREDOMINANTLY PMN MODERATE GRAM NEGATIVE RODS Performed at [facility], [place] [address] [place], [place] [address]    Culture ABUNDANT ESCHERICHIA COLI  Final   Report Status PENDING  Incomplete  Culture, blood (routine x 2)     Status: None (Preliminary result)   Collection Time: 05/14/20  5:00 AM   Specimen: BLOOD  Result Value Ref Range Status   Specimen Description   Final    BLOOD RIGHT ANTECUBITAL Performed at [facility] [address] [place], [place] [address]    Special Requests   Final    BOTTLES DRAWN AEROBIC AND ANAEROBIC Blood Culture adequate volume Performed at [facility] [address], [place], [place] [address]    Culture   Final    NO GROWTH < 12  HOURS Performed at [facility] [address] [place], [place] [address]    Report Status PENDING  Incomplete  Culture, blood (routine x 2)     Status: None (Preliminary result)   Collection Time: 05/14/20  5:05 AM   Specimen: BLOOD  Result Value Ref Range Status   Specimen Description   Final    BLOOD RIGHT ANTECUBITAL Performed at [facility] [address], [place], [place] [address]    Special Requests   Final    BOTTLES DRAWN AEROBIC AND ANAEROBIC Blood Culture adequate volume Performed at [facility] [address], [place], [place] [address]    Culture   Final    NO GROWTH < 12 HOURS Performed at [facility] [address] [place], [place] [address]    Report Status PENDING  Incomplete    Studies/Results: DG Chest Port 1 View  Result Date: 05/13/2020 CLINICAL DATA:  Fever EXAM: PORTABLE CHEST 1 VIEW COMPARISON:  May 10, 2020 FINDINGS: The heart size and mediastinal contours are unchanged with mild cardiomegaly. There is streaky airspace opacity at the right base as the CT, likely atelectasis scarring. The left lung is clear. No acute osseous. IMPRESSION: Unchanged airspace opacity at the right base, likely atelectasis or scarring Electronically Signed   By: Prudencio Pair M.D.   On: 05/13/2020 00:34   CT Renal Stone Study  Result Date: 05/13/2020 CLINICAL DATA:  Fever, question of UTI EXAM: CT ABDOMEN AND PELVIS WITHOUT CONTRAST TECHNIQUE: Multidetector CT imaging of the abdomen and pelvis was performed following the standard protocol without IV contrast. COMPARISON:  MRI of 07/30/2020 FINDINGS: Lower chest: The visualized heart size within normal limits. There is a small pericardial effusion present. Trace bilateral pleural effusions/atelectasis is noted. Hepatobiliary: There are new heterogeneously hypodense lesions seen within the liver parenchyma. The largest in the posterior right liver lobe measures 5.7 cm.  Within the anterior left liver lobe there to hypodense lesions the largest measuring 3.4 cm. No evidence of calcified gallstones or biliary ductal dilatation. Pancreas:  Unremarkable.  No surrounding inflammatory changes. Spleen: Normal in size. Although limited due to the lack of intravenous contrast, normal in appearance. Adrenals/Urinary Tract: Both adrenal glands appear normal. Again noted is a complex collection seen posterior to the right kidney measuring 4.5 cm in transverse dimension. There is heterogeneous fluid seen within the central portion and small foci of air now noted. A punctate calcifications seen in the upper pole of the right kidney. No hydronephrosis. There is mild left-sided perinephric fat stranding changes. Within the proximal left ureter there is a 4 mm calculus present. A 2 cm low-density lesion seen off the lower pole the left kidney. Stomach/Bowel: The stomach, small bowel, and colon are normal in appearance. No inflammatory changes or obstructive findings. Scattered colonic diverticula are noted. A moderate amount of colonic stool is present. Vascular/Lymphatic: There are no enlarged abdominal or pelvic lymph nodes. Scattered aortic atherosclerotic calcifications are seen without aneurysmal dilatation. Reproductive: The prostate is unremarkable. Other: No evidence of abdominal wall mass or hernia. Musculoskeletal: No acute or significant osseous findings. IMPRESSION: Heterogeneous complex collection posterior to the right kidney now with new foci of air, however appears grossly unchanged in size from the prior exam dating back to August 2021. This likely represents of para renal abscess given the patient's history. New/enlarging hypodense lesions seen throughout the liver parenchyma which could represent hepatic abscesses versus metastatic disease. Mild left perinephric inflammatory changes, likely due to a proximal left 4 mm renal calculus. Small pericardial effusion Electronically  Signed   By: Prudencio Pair M.D.   On: 05/13/2020 02:42   CT IMAGE GUIDED FLUID DRAIN BY CATHETER  Result Date: 05/13/2020 INDICATION: 64 year old male with recurrence/persisting abscess in the right perinephric space. He presents with sepsis and has been referred for percutaneous drainage EXAM: CT GUIDED DRAINAGE OF  ABSCESS MEDICATIONS: The patient is currently admitted to the hospital and receiving intravenous antibiotics. The antibiotics were administered within an appropriate time frame prior to the initiation of the procedure. ANESTHESIA/SEDATION: 0.5 mg IV Versed 25 mcg IV Fentanyl Moderate Sedation Time:  12 minutes The patient was continuously monitored during the procedure by the interventional radiology nurse under my direct supervision. COMPLICATIONS: None TECHNIQUE: Informed written consent was obtained from the patient after a thorough discussion of the procedural risks, benefits and alternatives. All questions were addressed. Maximal Sterile Barrier Technique was utilized including caps, mask, sterile gowns, sterile gloves, sterile drape, hand hygiene and skin  antiseptic. A timeout was performed prior to the initiation of the procedure. PROCEDURE: Patient was positioned left decubitus on the CT table. Scout CT was acquired. The right flank was prepped with chlorhexidine in a sterile fashion, and a sterile drape was applied covering the operative field. A sterile gown and sterile gloves were used for the procedure. Local anesthesia was provided with 1% Lidocaine. Once the patient is prepped and draped in the usual sterile fashion, CT guidance was used to advance a trocar needle into the phlegmon of the right perinephric space. Once we confirmed needle tip position with aspiration of purulent fluid, wire was placed and modified Seldinger technique was used to place a 10 [place] drain. Drain position was optimized with serial CT imaging. Drain was then sutured in position attached to bulb suction.  Sample was sent to lab for culture. Patient tolerated the procedure well and remained hemodynamically stable throughout. No complications were encountered and no significant blood loss. FINDINGS: Ten French drain into the left perinephric abscess. Uncertain if there is communication between the suspected perihepatic abscess and the perinephric collection. IMPRESSION: Status post CT-guided drainage of right perinephric abscess. Signed, Dulcy Fanny. Dellia Nims, RPVI [facility] Electronically Signed   By: Corrie Mckusick D.O.   On: 05/13/2020 09:07    Assessment/Plan: 64 year old man with multiple medical problems who presented to the ED with altered mental status found to have worsening right perinephric abscess with air as well as possible liver abscesses and 4 mm left proximal ureteral calculus with perinephric stranding with clinical signs of sepsis.  1. RIGHT perinephric abscess: Continue R PCN drain, tailor antibiotics to once sensitivities are back, prelim E coli  2. LEFT 4 mm proximal ureteral calculus: patient is clinically improving with right perinephric drain, will continue to observe for now, if patient declines will consider L PCN after not stable enough for OR  3. Urethral stricture: continue Foley catheter for maximal decompression  Thank you for involving me in this patient's care.  Please page with any further questions or concerns.     Jacalyn Lefevre, MD Urology 05/14/2020, 12:07 PM

## 2020-05-14 NOTE — Progress Notes (Signed)
Referring Physician(s): Pace,M  Supervising Physician: Arne Cleveland  Patient Status:  [facility] - In-pt  Chief Complaint:  Right perinephric abscess  Subjective: Pt feeling better since right perinephric drain placed yesterday; denies worsening abdominal pain, nausea, vomiting; remains in the ED waiting on inpatient bed   Allergies: Nsaids and Jardiance [empagliflozin]  Medications: Prior to Admission medications   Medication Sig Start Date End Date Taking? Authorizing Provider  albuterol (PROVENTIL HFA;VENTOLIN HFA) 108 (90 Base) MCG/ACT inhaler Inhale 2 puffs into the lungs every 6 (six) hours as needed for wheezing or shortness of breath. 04/29/18  Yes [name], MD  allopurinol (ZYLOPRIM) 100 MG tablet Take 200 mg by mouth every evening.    Yes [provider]  aspirin (EQ ASPIRIN ADULT LOW DOSE) 81 MG EC tablet TAKE 1 TABLET BY MOUTH ONCE DAILY. SWALLOW WHOLE Patient taking differently: Take 81 mg by mouth daily. TAKE 1 TABLET BY MOUTH ONCE DAILY. SWALLOW WHOLE 02/29/20  Yes [name], MD  atorvastatin (LIPITOR) 40 MG tablet Take 1 tablet (40 mg total) by mouth daily. 09/27/19  Yes Sueanne Margarita, MD  carvedilol (COREG) 12.5 MG tablet Take 1 tablet (12.5 mg total) by mouth 2 (two) times daily with a meal. 09/12/19  Yes Larey Dresser, MD  cyclobenzaprine (FLEXERIL) 5 MG tablet Take 5 mg by mouth every 12 (twelve) hours. 04/05/20  Yes [provider]  diclofenac Sodium (VOLTAREN) 1 % GEL Apply 4 g topically 4 (four) times daily. Patient taking differently: Apply 4 g topically 4 (four) times daily as needed (pain).  10/23/19  Yes Darr, Marguerita Beards, [name]  DULoxetine (CYMBALTA) 20 MG capsule TAKE 1 CAPSULE BY MOUTH ONCE DAILY Patient taking differently: Take 20 mg by mouth daily.  03/27/20  Yes [name], MD  famotidine (PEPCID) 20 MG tablet TAKE 1 TABLET BY MOUTH TWO TIMES DAILY Patient taking differently: Take 20 mg by mouth 2 (two) times  daily.  04/18/20  Yes [name], MD  ferrous sulfate 325 (65 FE) MG tablet Take 1 tablet (325 mg total) by mouth 2 (two) times daily with a meal. 01/14/20  Yes Lavina Hamman, MD  furosemide (LASIX) 80 MG tablet Take 80 mg by mouth daily.    Yes [provider]  Insulin Lispro Prot & Lispro (HUMALOG MIX 75/25 KWIKPEN) (75-25) 100 UNIT/ML Kwikpen Inject 100 Units into the skin daily with breakfast. 12/20/19  Yes Renato Shin, MD  isosorbide mononitrate (IMDUR) 30 MG 24 hr tablet Take 30 mg by mouth daily. 01/19/20  Yes [provider]  naloxone [facility]) nasal spray 4 mg/0.1 mL Place in nostril in the event of suspected overdose on Narcotic medication (Oxycodone) 02/16/20  Yes [name], MD  oxyCODONE (OXY IR/ROXICODONE) 5 MG immediate release tablet Take 5 mg by mouth 3 (three) times daily as needed for moderate pain.  01/24/20  Yes [provider]  polyethylene glycol (MIRALAX / GLYCOLAX) 17 g packet Take 17 g by mouth daily as needed for moderate constipation or severe constipation. 01/14/20  Yes Lavina Hamman, MD  Potassium Chloride ER 20 MEQ TBCR Take 20 mEq by mouth daily.  01/15/20  Yes [provider]  predniSONE (DELTASONE) 5 MG tablet Take 5 mg by mouth daily. 11/23/19  Yes [provider]  pregabalin (LYRICA) 100 MG capsule Take 100 mg by mouth 3 (three) times daily.    Yes [provider]  ramelteon (ROZEREM) 8 MG tablet TAKE  1 TABLET BY MOUTH AT BEDTIME Patient taking differently: Take 8 mg by mouth at bedtime.  04/18/20  Yes [name], MD  Secukinumab (COSENTYX) 150 MG/ML SOSY Inject 150 mg into the skin every 28 (twenty-eight) days. Hold until told to resume by PCP 01/14/20  Yes Lavina Hamman, MD  Tafamidis [facility]) 61 MG CAPS Take 61 mg by mouth daily. 09/08/19  Yes Bensimhon, Shaune Pascal, MD  Accu-Chek FastClix Lancets MISC 1 each by Other route 2 (two) times daily. E11.42 03/27/20   Renato Shin, MD  ACCU-CHEK GUIDE  test strip USE 1 STRIP TWICE DAILY TO CHECK BLOOD GLUCOSE 02/13/20   Renato Shin, MD  Insulin Pen Needle 32G X 4 MM MISC 1 each by Does not apply route daily. E11.9 03/06/20   Renato Shin, MD  sacubitril-valsartan (ENTRESTO) 24-26 MG Take 1 tablet by mouth 2 (two) times daily. 05/11/20   Bensimhon, Shaune Pascal, MD     Vital Signs: BP (!) 143/74   Pulse 62   Temp (!) 97.5 F (36.4 C) (Bladder)   Resp 16   Wt 234 lb 2.1 oz (106.2 kg)   SpO2 93%   BMI 37.79 kg/m   Physical Exam awake, alert.  Right perinephric drain intact, insertion site not significantly tender, reported output since placement 615 cc blood-tinged fluid  Imaging: DG Chest Port 1 View  Result Date: 05/13/2020 CLINICAL DATA:  Fever EXAM: PORTABLE CHEST 1 VIEW COMPARISON:  May 10, 2020 FINDINGS: The heart size and mediastinal contours are unchanged with mild cardiomegaly. There is streaky airspace opacity at the right base as the CT, likely atelectasis scarring. The left lung is clear. No acute osseous. IMPRESSION: Unchanged airspace opacity at the right base, likely atelectasis or scarring Electronically Signed   By: Prudencio Pair M.D.   On: 05/13/2020 00:34   CT Renal Stone Study  Result Date: 05/13/2020 CLINICAL DATA:  Fever, question of UTI EXAM: CT ABDOMEN AND PELVIS WITHOUT CONTRAST TECHNIQUE: Multidetector CT imaging of the abdomen and pelvis was performed following the standard protocol without IV contrast. COMPARISON:  MRI of 07/30/2020 FINDINGS: Lower chest: The visualized heart size within normal limits. There is a small pericardial effusion present. Trace bilateral pleural effusions/atelectasis is noted. Hepatobiliary: There are new heterogeneously hypodense lesions seen within the liver parenchyma. The largest in the posterior right liver lobe measures 5.7 cm. Within the anterior left liver lobe there to hypodense lesions the largest measuring 3.4 cm. No evidence of calcified gallstones or biliary ductal  dilatation. Pancreas:  Unremarkable.  No surrounding inflammatory changes. Spleen: Normal in size. Although limited due to the lack of intravenous contrast, normal in appearance. Adrenals/Urinary Tract: Both adrenal glands appear normal. Again noted is a complex collection seen posterior to the right kidney measuring 4.5 cm in transverse dimension. There is heterogeneous fluid seen within the central portion and small foci of air now noted. A punctate calcifications seen in the upper pole of the right kidney. No hydronephrosis. There is mild left-sided perinephric fat stranding changes. Within the proximal left ureter there is a 4 mm calculus present. A 2 cm low-density lesion seen off the lower pole the left kidney. Stomach/Bowel: The stomach, small bowel, and colon are normal in appearance. No inflammatory changes or obstructive findings. Scattered colonic diverticula are noted. A moderate amount of colonic stool is present. Vascular/Lymphatic: There are no enlarged abdominal or pelvic lymph nodes. Scattered aortic atherosclerotic calcifications are seen without aneurysmal dilatation. Reproductive: The prostate is unremarkable. Other: No  evidence of abdominal wall mass or hernia. Musculoskeletal: No acute or significant osseous findings. IMPRESSION: Heterogeneous complex collection posterior to the right kidney now with new foci of air, however appears grossly unchanged in size from the prior exam dating back to August 2021. This likely represents of para renal abscess given the patient's history. New/enlarging hypodense lesions seen throughout the liver parenchyma which could represent hepatic abscesses versus metastatic disease. Mild left perinephric inflammatory changes, likely due to a proximal left 4 mm renal calculus. Small pericardial effusion Electronically Signed   By: Prudencio Pair M.D.   On: 05/13/2020 02:42   CT IMAGE GUIDED FLUID DRAIN BY CATHETER  Result Date: 05/13/2020 INDICATION: 64 year old  male with recurrence/persisting abscess in the right perinephric space. He presents with sepsis and has been referred for percutaneous drainage EXAM: CT GUIDED DRAINAGE OF  ABSCESS MEDICATIONS: The patient is currently admitted to the hospital and receiving intravenous antibiotics. The antibiotics were administered within an appropriate time frame prior to the initiation of the procedure. ANESTHESIA/SEDATION: 0.5 mg IV Versed 25 mcg IV Fentanyl Moderate Sedation Time:  12 minutes The patient was continuously monitored during the procedure by the interventional radiology nurse under my direct supervision. COMPLICATIONS: None TECHNIQUE: Informed written consent was obtained from the patient after a thorough discussion of the procedural risks, benefits and alternatives. All questions were addressed. Maximal Sterile Barrier Technique was utilized including caps, mask, sterile gowns, sterile gloves, sterile drape, hand hygiene and skin antiseptic. A timeout was performed prior to the initiation of the procedure. PROCEDURE: Patient was positioned left decubitus on the CT table. Scout CT was acquired. The right flank was prepped with chlorhexidine in a sterile fashion, and a sterile drape was applied covering the operative field. A sterile gown and sterile gloves were used for the procedure. Local anesthesia was provided with 1% Lidocaine. Once the patient is prepped and draped in the usual sterile fashion, CT guidance was used to advance a trocar needle into the phlegmon of the right perinephric space. Once we confirmed needle tip position with aspiration of purulent fluid, wire was placed and modified Seldinger technique was used to place a 10 [place] drain. Drain position was optimized with serial CT imaging. Drain was then sutured in position attached to bulb suction. Sample was sent to lab for culture. Patient tolerated the procedure well and remained hemodynamically stable throughout. No complications were encountered  and no significant blood loss. FINDINGS: Ten French drain into the left perinephric abscess. Uncertain if there is communication between the suspected perihepatic abscess and the perinephric collection. IMPRESSION: Status post CT-guided drainage of right perinephric abscess. Signed, Dulcy Fanny. Dellia Nims, RPVI [facility] Electronically Signed   By: Corrie Mckusick D.O.   On: 05/13/2020 09:07    Labs:  CBC: Recent Labs    02/16/20 1218 05/13/20 0010 05/13/20 0700 05/14/20 0415  WBC 13.0* 23.6* 27.1* 24.2*  HGB 12.1* 12.7* 10.1* 10.0*  HCT 37.2* 39.8 32.2* 31.3*  PLT 357 387 283 286    COAGS: Recent Labs    12/08/19 2014 12/30/19 1106 05/13/20 0054 05/13/20 0700  INR 1.3* 1.2 1.3* 1.4*  APTT 33  --  31  --     BMP: Recent Labs    02/16/20 1218 05/13/20 0054 05/13/20 0700 05/14/20 0509  NA 143 141 139 139  K 4.2 5.7* 5.4* 4.5  CL 97 102 104 108  CO2 30* 28 23 23   GLUCOSE 166* 142* 162* 154*  BUN 17 30* 31* 30*  CALCIUM 9.3 8.6* 7.9* 7.6*  CREATININE 1.29* 2.33* 2.46* 1.80*  GFRNONAA 58* 28* 26* 39*  GFRAA 67 33* 31* 45*    LIVER FUNCTION TESTS: Recent Labs    01/13/20 0855 02/16/20 1218 05/13/20 0054 05/13/20 0700  BILITOT 0.5 0.2 1.2 1.2  AST 25 9 20 16   ALT 65* 14 23 19   ALKPHOS 72 75 59 45  PROT 6.6 8.1 7.7 6.6  ALBUMIN 1.8* 4.0 2.8* 2.3*    Assessment and Plan: Patient with history of recurrent right perinephric abscess; status post drain placement on 10/3; afebrile, WBC 24.2 down from 27, hemoglobin 10, creatinine 1.8 down from 2.46, latest blood cultures negative today, right perinephric drain cultures growing E. coli, urine cultures with E. coli as well, previous blood cultures with E. coli and Enterobacter; antibiotic therapy per primary team, continue with drain flushes, output monitoring; follow-up imaging once output minimal ; additional plans as per urology/TR[facility]   Electronically  Signed: D. Rowe Robert, [name] 05/14/2020, 4:06 PM   I spent a total of 15 minutes at the the patient's bedside AND on the patient's hospital floor or unit, greater than 50% of which was counseling/coordinating care for right perinephric abscess drain    Patient ID: Marco [name], male   DOB: 03-20-1956, 64 y.o.   MRN: [id number]

## 2020-05-14 NOTE — Progress Notes (Signed)
PROGRESS NOTE    Marco [name]   PYP:[id number]  DOB: 04-Dec-1955  DOA: 05/12/2020     1  PCP: [name], MD  CC: AMS  Hospital Course: Marco [name] is a 64 yo AA male with PMH DMII, HTN, dCHF (EF most recently 50-55% in May, grade 1 DD), RA on biologic therapy.  Patient was admitted in April / May most recently.  Had sepsis, hypotension at that time secondary to UTI with a R perinephric abscess (3.9 cm at that time).  Drainage of perinephric abscess in late May grew out E.Coli sensitive to cefepime, rocephin.  He had an MRI abdomen to follow up in Aug which demonstrated worsening of the R paranephric abscess (see MR result from that time).   Patient presents to the ED with AMS, patient not able to provide much in the way of history due to lethargy.  In the ER he was febrile, 105.2 and developed hypotension.  WBC 23.6, Creat up to 2.3 (baseline 1.3).  UA consistent with infection CT abd/pelvis revealed right perinephric abscess and left renal calculus.  Urology and IR were consulted. He underwent placement of right perc drain to the perinephric abscess with 42F pigtail drain on 05/13/20.  Blood and urine cultures were obtained on admission as well as culture from perinephric abscess which was sent after drain placement. He was initially treated with vancomycin, cefepime, Flagyl in the ER and continued on cefepime and flagyl while cultures further developed. Blood, urine, and perinephric abscess cultures all returned positive with E. coli.  Antibiotics were deescalated down to Rocephin and Flagyl while awaiting further anaerobic culture results.    Interval History:  No events overnight.  A little more awake and alert in bed this morning.  Still awaiting a bed while in the ER.  Old records reviewed in assessment of this patient  ROS: Constitutional: positive for chills, fatigue and fevers, Respiratory: negative for cough, Cardiovascular: negative for chest pain and  Gastrointestinal: positive for abdominal pain  Assessment & Plan: * Severe sepsis (HCC) -Fever, tachycardia, tachypnea, leukocytosis, lactic acidosis, encephalopathy, source of infection considered urinary/perinephric abscess -Given vancomycin, cefepime, Flagyl on admission - Blood, urine, and perinephric abscess cultures growing E. Coli - abx de-escalated to CTX/Flagyl; await further anaerobic growth; prefer to cover for now - PCT downtrending  Acute renal failure superimposed on stage 3 chronic kidney disease ([place]) -Unclear etiology, but possible contribution from outlet obstruction versus severe sepsis -Patient may still need left-sided percutaneous nephrostomy tube -Continue LR and monitor renal function  Perinephric abscess - right sided; seen on CT - s/p drain placement with IR on 10/3 - follow up culture results - see severe sepsis   Nephrolithiasis -CT reveals a 4 mm left-sided ureteral calculus.  Per urology, patient may still need percutaneous nephrostomy tube -Continue to monitor renal response to fluids and current interventions in place as well as further plans per urology  Acute metabolic encephalopathy -Typically alert and oriented.  Encephalopathy considered in setting of underlying infection  Hyperkalemia-resolved as of 05/14/2020 - continue tele - no indication for treatment at this time - BMP in am   Chronic combined systolic and diastolic CHF (congestive heart failure) ([place]) -Monitor for signs of volume overload while on fluids -Follows outpatient with cardiology  Type 2 diabetes mellitus with stage 3 chronic kidney disease, without long-term current use of insulin ([place]) - continue SSI and CBG monitoring   Rheumatoid arthritis ([place]) -On outpatient treatment -See sepsis.  Was started on  steroids as patient is on chronic prednisone 5 mg daily at home as well  Essential hypertension -Holding BP meds in setting of severe sepsis  Atrial fibrillation  ([place]) -Holding Coreg in setting of infection -Aspirin on hold in case of need for further interventions   Antimicrobials: Vancomycin 10/3/2021x1 Cefepime 05/13/2020>> 05/13/2020 Rocephin 05/13/2020>> present Flagyl 05/13/2020>> present  DVT prophylaxis: Lovenox Code Status: Full Family Communication: none present this am, but updated on phone on admission Disposition Plan: Status is: Inpatient  Remains inpatient appropriate because:Altered mental status, Ongoing diagnostic testing needed not appropriate for outpatient work up, Unsafe d/c plan, IV treatments appropriate due to intensity of illness or inability to take PO and Inpatient level of care appropriate due to severity of illness   Dispo: The patient is from: Home              Anticipated d/c is to: pending eventual PT eval and clinical course              Anticipated d/c date is: > 3 days              Patient currently is not medically stable to d/c.  Objective: Blood pressure (!) 158/78, pulse 76, temperature (!) 97.5 F (36.4 C), temperature source Bladder, resp. rate 16, weight 106.2 kg, SpO2 93 %.  Examination: General appearance: fatigued, no distress and slowed mentation Head: Normocephalic, without obvious abnormality, atraumatic Eyes: EOMI Lungs: clear to auscultation bilaterally Heart: regular rate and rhythm and S1, S2 normal Abdomen: nonspecific TTP, no R/G, BS present, obese, ND; right drain in place Extremities: no edema Skin: mobility and turgor normal Neurologic: no focal deficits; grossly weak  Consultants:   Urology  IR  Procedures:   Right perinephric abscess drain placement, 05/13/20  Data Reviewed: I have personally reviewed following labs and imaging studies Results for orders placed or performed during the hospital encounter of 05/12/20 (from the past 24 hour(s))  CBC with Differential/Platelet     Status: Abnormal   Collection Time: 05/14/20  4:15 AM  Result Value Ref Range   WBC 24.2  (H) 4.0 - 10.5 K/uL   RBC 3.48 (L) 4.22 - 5.81 MIL/uL   Hemoglobin 10.0 (L) 13.0 - 17.0 g/dL   HCT 31.3 (L) 39 - 52 %   MCV 89.9 80.0 - 100.0 fL   MCH 28.7 26.0 - 34.0 pg   MCHC 31.9 30.0 - 36.0 g/dL   RDW 18.1 (H) 11.5 - 15.5 %   Platelets 286 150 - 400 K/uL   nRBC 0.0 0.0 - 0.2 %   Neutrophils Relative % 89 %   Neutro Abs 21.6 (H) 1.7 - 7.7 K/uL   Lymphocytes Relative 4 %   Lymphs Abs 0.9 0.7 - 4.0 K/uL   Monocytes Relative 6 %   Monocytes Absolute 1.4 (H) 0 - 1 K/uL   Eosinophils Relative 0 %   Eosinophils Absolute 0.0 0 - 0 K/uL   Basophils Relative 0 %   Basophils Absolute 0.0 0 - 0 K/uL   Immature Granulocytes 1 %   Abs Immature Granulocytes 0.20 (H) 0.00 - 0.07 K/uL  Culture, blood (routine x 2)     Status: None (Preliminary result)   Collection Time: 05/14/20  5:00 AM   Specimen: BLOOD  Result Value Ref Range   Specimen Description      BLOOD RIGHT ANTECUBITAL Performed at [facility], [address], [place], [place] [address]    Special Requests  BOTTLES DRAWN AEROBIC AND ANAEROBIC Blood Culture adequate volume Performed at [facility] [address] [place], [place] [address]    Culture      NO GROWTH < 12 HOURS Performed at [facility] [address] [place], [place] [address]    Report Status PENDING   Culture, blood (routine x 2)     Status: None (Preliminary result)   Collection Time: 05/14/20  5:05 AM   Specimen: BLOOD  Result Value Ref Range   Specimen Description      BLOOD RIGHT ANTECUBITAL Performed at [facility] [address] [place], [place] [address]    Special Requests      BOTTLES DRAWN AEROBIC AND ANAEROBIC Blood Culture adequate volume Performed at [facility] [address], [place], [place] [address]    Culture      NO GROWTH < 12 HOURS Performed at [facility] [address], [place], [place] [address]    Report Status PENDING    Basic metabolic panel     Status: Abnormal   Collection Time: 05/14/20  5:09 AM  Result Value Ref Range   Sodium 139 135 - 145 mmol/L   Potassium 4.5 3.5 - 5.1 mmol/L   Chloride 108 98 - 111 mmol/L   CO2 23 22 - 32 mmol/L   Glucose, Bld 154 (H) 70 - 99 mg/dL   BUN 30 (H) 8 - 23 mg/dL   Creatinine, Ser 1.80 (H) 0.61 - 1.24 mg/dL   Calcium 7.6 (L) 8.9 - 10.3 mg/dL   GFR calc non Af Amer 39 (L) >60 mL/min   GFR calc Af Amer 45 (L) >60 mL/min   Anion gap 8 5 - 15  Magnesium     Status: None   Collection Time: 05/14/20  5:09 AM  Result Value Ref Range   Magnesium 2.0 1.7 - 2.4 mg/dL  Procalcitonin     Status: None   Collection Time: 05/14/20  5:09 AM  Result Value Ref Range   Procalcitonin 11.37 ng/mL  Phosphorus     Status: None   Collection Time: 05/14/20  5:09 AM  Result Value Ref Range   Phosphorus 3.3 2.5 - 4.6 mg/dL    Recent Results (from the past 240 hour(s))  Blood Culture (routine x 2)     Status: Abnormal (Preliminary result)   Collection Time: 05/13/20 12:10 AM   Specimen: BLOOD  Result Value Ref Range Status   Specimen Description   Final    BLOOD RIGHT ANTECUBITAL Performed at [facility], [place] [address], [place], [place] [address]    Special Requests   Final    BOTTLES DRAWN AEROBIC AND ANAEROBIC Blood Culture adequate volume Performed at [facility] [address] [place], [place] [address]    Culture  Setup Time   Final    GRAM NEGATIVE RODS IN BOTH AEROBIC AND ANAEROBIC BOTTLES Organism ID to follow CRITICAL RESULT CALLED TO, READ BACK BY AND VERIFIED WITH: Shelda Jakes PHARMD, AT 1704 05/13/20 BY Rush Landmark Performed at [facility], [place] [address] [place], [place] [address]    Culture ESCHERICHIA COLI (A)  Final   Report Status PENDING  Incomplete  Blood Culture ID Panel (Reflexed)     Status: Abnormal   Collection Time: 05/13/20 12:10 AM  Result Value Ref Range Status   Enterococcus faecalis NOT  DETECTED NOT DETECTED Final   Enterococcus Faecium NOT DETECTED  NOT DETECTED Final   Listeria monocytogenes NOT DETECTED NOT DETECTED Final   Staphylococcus species NOT DETECTED NOT DETECTED Final   Staphylococcus aureus (BCID) NOT DETECTED NOT DETECTED Final   Staphylococcus epidermidis NOT DETECTED NOT DETECTED Final   Staphylococcus lugdunensis NOT DETECTED NOT DETECTED Final   Streptococcus species NOT DETECTED NOT DETECTED Final   Streptococcus agalactiae NOT DETECTED NOT DETECTED Final   Streptococcus pneumoniae NOT DETECTED NOT DETECTED Final   Streptococcus pyogenes NOT DETECTED NOT DETECTED Final   A.calcoaceticus-baumannii NOT DETECTED NOT DETECTED Final   Bacteroides fragilis NOT DETECTED NOT DETECTED Final   Enterobacterales DETECTED (A) NOT DETECTED Final    Comment: Enterobacterales represent a large order of gram negative bacteria, not a single organism. CRITICAL RESULT CALLED TO, READ BACK BY AND VERIFIED WITH: Shelda Jakes PHARMD, AT 1704 05/13/20 BY D. VANHOOK    Enterobacter cloacae complex NOT DETECTED NOT DETECTED Final   Escherichia coli DETECTED (A) NOT DETECTED Final    Comment: CRITICAL RESULT CALLED TO, READ BACK BY AND VERIFIED WITH: Shelda Jakes PHARMD, AT 1704 05/13/20 BY D. VANHOOK    Klebsiella aerogenes NOT DETECTED NOT DETECTED Final   Klebsiella oxytoca NOT DETECTED NOT DETECTED Final   Klebsiella pneumoniae NOT DETECTED NOT DETECTED Final   Proteus species NOT DETECTED NOT DETECTED Final   Salmonella species NOT DETECTED NOT DETECTED Final   Serratia marcescens NOT DETECTED NOT DETECTED Final   Haemophilus influenzae NOT DETECTED NOT DETECTED Final   Neisseria meningitidis NOT DETECTED NOT DETECTED Final   Pseudomonas aeruginosa NOT DETECTED NOT DETECTED Final   Stenotrophomonas maltophilia NOT DETECTED NOT DETECTED Final   Candida albicans NOT DETECTED NOT DETECTED Final   Candida auris NOT DETECTED NOT DETECTED Final   Candida glabrata NOT DETECTED  NOT DETECTED Final   Candida krusei NOT DETECTED NOT DETECTED Final   Candida parapsilosis NOT DETECTED NOT DETECTED Final   Candida tropicalis NOT DETECTED NOT DETECTED Final   Cryptococcus neoformans/gattii NOT DETECTED NOT DETECTED Final   CTX-M ESBL NOT DETECTED NOT DETECTED Final   Carbapenem resistance IMP NOT DETECTED NOT DETECTED Final   Carbapenem resistance KPC NOT DETECTED NOT DETECTED Final   Carbapenem resistance NDM NOT DETECTED NOT DETECTED Final   Carbapenem resist OXA 48 LIKE NOT DETECTED NOT DETECTED Final   Carbapenem resistance VIM NOT DETECTED NOT DETECTED Final    Comment: Performed at [facility], [address] [place], [place] [address]  Blood Culture (routine x 2)     Status: None (Preliminary result)   Collection Time: 05/13/20 12:54 AM   Specimen: BLOOD LEFT FOREARM  Result Value Ref Range Status   Specimen Description   Final    BLOOD LEFT FOREARM Performed at [facility] [address] [place], [place] [address]    Special Requests   Final    BOTTLES DRAWN AEROBIC AND ANAEROBIC Blood Culture results may not be optimal due to an excessive volume of blood received in culture bottles Performed at [facility] [address] [place], [place] [address]    Culture   Final    NO GROWTH 1 DAY Performed at [facility], [place] [address] [place],  [address]    Report Status PENDING  Incomplete  Urine culture     Status: Abnormal (Preliminary result)   Collection Time: 05/13/20 12:54 AM   Specimen: In/Out Cath Urine  Result Value Ref Range Status   Specimen Description   Final  IN/OUT CATH URINE Performed at [facility], [place] [address] [place], [place] [address]    Special Requests   Final    NONE Performed at [facility], [place] [address], [place], [place] [address]    Culture (A)  Final    >=100,000 COLONIES/mL ESCHERICHIA COLI SUSCEPTIBILITIES TO  FOLLOW Performed at [facility], [place] [address] [place], [place] [address]    Report Status PENDING  Incomplete  Respiratory Panel by RT PCR (Flu A&B, Covid) - Nasopharyngeal Swab     Status: None   Collection Time: 05/13/20  1:26 AM   Specimen: Nasopharyngeal Swab  Result Value Ref Range Status   SARS Coronavirus 2 by RT PCR NEGATIVE NEGATIVE Final    Comment: (NOTE) SARS-CoV-2 target nucleic acids are NOT DETECTED.  The SARS-CoV-2 RNA is generally detectable in upper respiratoy specimens during the acute phase of infection. The lowest concentration of SARS-CoV-2 viral copies this assay can detect is 131 copies/mL. A negative result does not preclude SARS-Cov-2 infection and should not be used as the sole basis for treatment or other patient management decisions. A negative result may occur with  improper specimen collection/handling, submission of specimen other than nasopharyngeal swab, presence of viral mutation(s) within the areas targeted by this assay, and inadequate number of viral copies (<131 copies/mL). A negative result must be combined with clinical observations, patient history, and epidemiological information. The expected result is Negative.  Fact Sheet for Patients:  PinkCheek.be  Fact Sheet for Healthcare Providers:  GravelBags.it  This test is no t yet approved or cleared by the [place] FDA and  has been authorized for detection and/or diagnosis of SARS-CoV-2 by FDA under an Emergency Use Authorization (EUA). This EUA will remain  in effect (meaning this test can be used) for the duration of the COVID-19 declaration under Section 564(b)(1) of the Act, 21 U.S.C. section 360bbb-3(b)(1), unless the authorization is terminated or revoked sooner.     Influenza A by PCR NEGATIVE NEGATIVE Final   Influenza B by PCR NEGATIVE NEGATIVE Final    Comment: (NOTE) The Xpert Xpress SARS-CoV-2/FLU/RSV  assay is intended as an aid in  the diagnosis of influenza from Nasopharyngeal swab specimens and  should not be used as a sole basis for treatment. Nasal washings and  aspirates are unacceptable for Xpert Xpress SARS-CoV-2/FLU/RSV  testing.  Fact Sheet for Patients: PinkCheek.be  Fact Sheet for Healthcare Providers: GravelBags.it  This test is not yet approved or cleared by the [place] FDA and  has been authorized for detection and/or diagnosis of SARS-CoV-2 by  FDA under an Emergency Use Authorization (EUA). This EUA will remain  in effect (meaning this test can be used) for the duration of the  Covid-19 declaration under Section 564(b)(1) of the Act, 21  U.S.C. section 360bbb-3(b)(1), unless the authorization is  terminated or revoked. Performed at [facility], [place] [address] [place], [place] [address]   Aerobic/Anaerobic Culture (surgical/deep wound)     Status: None (Preliminary result)   Collection Time: 05/13/20  6:20 AM   Specimen: Abdomen; Abscess  Result Value Ref Range Status   Specimen Description   Final    ABDOMEN Performed at [facility] [address], [place], [place] [address]    Special Requests   Final    NONE Performed at [facility], [place] [address], [place],  [address]    Gram Stain   Final  ABUNDANT WBC PRESENT, PREDOMINANTLY PMN MODERATE GRAM NEGATIVE RODS Performed at [facility], [place] [address], [place], [place] [address]    Culture ABUNDANT ESCHERICHIA COLI  Final   Report Status PENDING  Incomplete  Culture, blood (routine x 2)     Status: None (Preliminary result)   Collection Time: 05/14/20  5:00 AM   Specimen: BLOOD  Result Value Ref Range Status   Specimen Description   Final    BLOOD RIGHT ANTECUBITAL Performed at [facility] [address] [place], [place] [address]     Special Requests   Final    BOTTLES DRAWN AEROBIC AND ANAEROBIC Blood Culture adequate volume Performed at [facility] [address] [place] [address], [place], [place] [address]    Culture   Final    NO GROWTH < 12 HOURS Performed at [facility] [address], [place], [place] [address]    Report Status PENDING  Incomplete  Culture, blood (routine x 2)     Status: None (Preliminary result)   Collection Time: 05/14/20  5:05 AM   Specimen: BLOOD  Result Value Ref Range Status   Specimen Description   Final    BLOOD RIGHT ANTECUBITAL Performed at [facility] [address], [place], [place] [address]    Special Requests   Final    BOTTLES DRAWN AEROBIC AND ANAEROBIC Blood Culture adequate volume Performed at [facility] [address] [place],  [address]    Culture   Final    NO GROWTH < 12 HOURS Performed at [facility] [name], [place],  [address]    Report Status PENDING  Incomplete     Radiology Studies: DG Chest Port 1 View  Result Date: 05/13/2020 CLINICAL DATA:  Fever EXAM: PORTABLE CHEST 1 VIEW COMPARISON:  May 10, 2020 FINDINGS: The heart size and mediastinal contours are unchanged with mild cardiomegaly. There is streaky airspace opacity at the right base as the CT, likely atelectasis scarring. The left lung is clear. No acute osseous. IMPRESSION: Unchanged airspace opacity at the right base, likely atelectasis or scarring Electronically Signed   By: Prudencio Pair M.D.   On: 05/13/2020 00:34   CT Renal Stone Study  Result Date: 05/13/2020 CLINICAL DATA:  Fever, question of UTI EXAM: CT ABDOMEN AND PELVIS WITHOUT CONTRAST TECHNIQUE: Multidetector CT imaging of the abdomen and pelvis was performed following the standard protocol without IV contrast. COMPARISON:  MRI of 07/30/2020 FINDINGS: Lower chest: The visualized heart size within normal limits. There is a small pericardial effusion  present. Trace bilateral pleural effusions/atelectasis is noted. Hepatobiliary: There are new heterogeneously hypodense lesions seen within the liver parenchyma. The largest in the posterior right liver lobe measures 5.7 cm. Within the anterior left liver lobe there to hypodense lesions the largest measuring 3.4 cm. No evidence of calcified gallstones or biliary ductal dilatation. Pancreas:  Unremarkable.  No surrounding inflammatory changes. Spleen: Normal in size. Although limited due to the lack of intravenous contrast, normal in appearance. Adrenals/Urinary Tract: Both adrenal glands appear normal. Again noted is a complex collection seen posterior to the right kidney measuring 4.5 cm in transverse dimension. There is heterogeneous fluid seen within the central portion and small foci of air now noted. A punctate calcifications seen in the upper pole of the right kidney. No hydronephrosis. There is mild left-sided perinephric fat stranding changes. Within the proximal left ureter there is a 4 mm calculus present.  A 2 cm low-density lesion seen off the lower pole the left kidney. Stomach/Bowel: The stomach, small bowel, and colon are normal in appearance. No inflammatory changes or obstructive findings. Scattered colonic diverticula are noted. A moderate amount of colonic stool is present. Vascular/Lymphatic: There are no enlarged abdominal or pelvic lymph nodes. Scattered aortic atherosclerotic calcifications are seen without aneurysmal dilatation. Reproductive: The prostate is unremarkable. Other: No evidence of abdominal wall mass or hernia. Musculoskeletal: No acute or significant osseous findings. IMPRESSION: Heterogeneous complex collection posterior to the right kidney now with new foci of air, however appears grossly unchanged in size from the prior exam dating back to August 2021. This likely represents of para renal abscess given the patient's history. New/enlarging hypodense lesions seen throughout the  liver parenchyma which could represent hepatic abscesses versus metastatic disease. Mild left perinephric inflammatory changes, likely due to a proximal left 4 mm renal calculus. Small pericardial effusion Electronically Signed   By: Prudencio Pair M.D.   On: 05/13/2020 02:42   CT IMAGE GUIDED FLUID DRAIN BY CATHETER  Result Date: 05/13/2020 INDICATION: 64 year old male with recurrence/persisting abscess in the right perinephric space. He presents with sepsis and has been referred for percutaneous drainage EXAM: CT GUIDED DRAINAGE OF  ABSCESS MEDICATIONS: The patient is currently admitted to the hospital and receiving intravenous antibiotics. The antibiotics were administered within an appropriate time frame prior to the initiation of the procedure. ANESTHESIA/SEDATION: 0.5 mg IV Versed 25 mcg IV Fentanyl Moderate Sedation Time:  12 minutes The patient was continuously monitored during the procedure by the interventional radiology nurse under my direct supervision. COMPLICATIONS: None TECHNIQUE: Informed written consent was obtained from the patient after a thorough discussion of the procedural risks, benefits and alternatives. All questions were addressed. Maximal Sterile Barrier Technique was utilized including caps, mask, sterile gowns, sterile gloves, sterile drape, hand hygiene and skin antiseptic. A timeout was performed prior to the initiation of the procedure. PROCEDURE: Patient was positioned left decubitus on the CT table. Scout CT was acquired. The right flank was prepped with chlorhexidine in a sterile fashion, and a sterile drape was applied covering the operative field. A sterile gown and sterile gloves were used for the procedure. Local anesthesia was provided with 1% Lidocaine. Once the patient is prepped and draped in the usual sterile fashion, CT guidance was used to advance a trocar needle into the phlegmon of the right perinephric space. Once we confirmed needle tip position with aspiration of  purulent fluid, wire was placed and modified Seldinger technique was used to place a 10 [place] drain. Drain position was optimized with serial CT imaging. Drain was then sutured in position attached to bulb suction. Sample was sent to lab for culture. Patient tolerated the procedure well and remained hemodynamically stable throughout. No complications were encountered and no significant blood loss. FINDINGS: Ten French drain into the left perinephric abscess. Uncertain if there is communication between the suspected perihepatic abscess and the perinephric collection. IMPRESSION: Status post CT-guided drainage of right perinephric abscess. Signed, Dulcy Fanny. Dellia Nims, RPVI [facility] Radiology Electronically Signed   By: Corrie Mckusick D.O.   On: 05/13/2020 09:07   CT IMAGE GUIDED FLUID DRAIN BY CATHETER  Final Result    CT Renal Stone Study  Final Result    DG Chest Port 1 View  Final Result      Scheduled Meds: . enoxaparin (LOVENOX) injection  40 mg Subcutaneous Q24H  . hydrocortisone sod succinate (SOLU-CORTEF)  inj  50 mg Intravenous Q6H  . sodium chloride flush  5 mL Intracatheter Q8H   PRN Meds: acetaminophen **OR** acetaminophen, ondansetron **OR** ondansetron (ZOFRAN) IV, oxyCODONE Continuous Infusions: . cefTRIAXone (ROCEPHIN)  IV Stopped (05/13/20 1848)  . lactated ringers 125 mL/hr at 05/14/20 1131  . metronidazole Stopped (05/14/20 1039)      LOS: 1 day  Time spent: Greater than 50% of the 35 minute visit was spent in counseling/coordination of care for the patient as laid out in the A&P.   Dwyane Dee, MD [facility] 05/14/2020, 4:37 PM  Contact via secure chat.  To contact the attending provider between 7A-7P or the covering provider during after hours 7P-7A, please log into the web site [email or web address] and access using universal [place] password for that web site. If you do not have the password, please call  the hospital operator.

## 2020-05-15 ENCOUNTER — Inpatient Hospital Stay (HOSPITAL_COMMUNITY): Payer: Medicare Other

## 2020-05-15 ENCOUNTER — Other Ambulatory Visit: Payer: Self-pay | Admitting: Radiology

## 2020-05-15 DIAGNOSIS — R6520 Severe sepsis without septic shock: Secondary | ICD-10-CM | POA: Diagnosis not present

## 2020-05-15 DIAGNOSIS — A419 Sepsis, unspecified organism: Secondary | ICD-10-CM | POA: Diagnosis not present

## 2020-05-15 LAB — URINE CULTURE: Culture: >=100000 — AB

## 2020-05-15 LAB — CBC WITH DIFFERENTIAL/PLATELET
Abs Immature Granulocytes: 0.13 10*3/uL — ABNORMAL HIGH (ref 0.00–0.07)
Abs Immature Granulocytes: 0.21 10*3/uL — ABNORMAL HIGH (ref 0.00–0.07)
Abs Immature Granulocytes: 0.13 10*3/uL — ABNORMAL HIGH (ref 0.00–0.07)
Basophils Absolute: 0 10*3/uL (ref 0.0–0.1)
Basophils Absolute: 0 10*3/uL (ref 0.0–0.1)
Basophils Absolute: 0 10*3/uL (ref 0.0–0.1)
Basophils Relative: 0 %
Basophils Relative: 0 %
Basophils Relative: 0 %
Eosinophils Absolute: 0 10*3/uL (ref 0.0–0.5)
Eosinophils Absolute: 0 10*3/uL (ref 0.0–0.5)
Eosinophils Absolute: 0 10*3/uL (ref 0.0–0.5)
Eosinophils Relative: 0 %
Eosinophils Relative: 0 %
Eosinophils Relative: 0 %
HCT: 33.9 % — ABNORMAL LOW (ref 39.0–52.0)
HCT: 24.8 % — ABNORMAL LOW (ref 39.0–52.0)
HCT: 25.8 % — ABNORMAL LOW (ref 39.0–52.0)
Hemoglobin: 10.6 g/dL — ABNORMAL LOW (ref 13.0–17.0)
Hemoglobin: 8 g/dL — ABNORMAL LOW (ref 13.0–17.0)
Hemoglobin: 8 g/dL — ABNORMAL LOW (ref 13.0–17.0)
Immature Granulocytes: 1 %
Immature Granulocytes: 1 %
Immature Granulocytes: 1 %
Lymphocytes Relative: 4 %
Lymphocytes Relative: 5 %
Lymphocytes Relative: 7 %
Lymphs Abs: 0.8 10*3/uL (ref 0.7–4.0)
Lymphs Abs: 1 10*3/uL (ref 0.7–4.0)
Lymphs Abs: 1.5 10*3/uL (ref 0.7–4.0)
MCH: 27.8 pg (ref 26.0–34.0)
MCH: 28.4 pg (ref 26.0–34.0)
MCH: 27.9 pg (ref 26.0–34.0)
MCHC: 31.3 g/dL (ref 30.0–36.0)
MCHC: 32.3 g/dL (ref 30.0–36.0)
MCHC: 31 g/dL (ref 30.0–36.0)
MCV: 89 fL (ref 80.0–100.0)
MCV: 87.9 fL (ref 80.0–100.0)
MCV: 89.9 fL (ref 80.0–100.0)
Monocytes Absolute: 1.1 10*3/uL — ABNORMAL HIGH (ref 0.1–1.0)
Monocytes Absolute: 2.2 10*3/uL — ABNORMAL HIGH (ref 0.1–1.0)
Monocytes Absolute: 2.4 10*3/uL — ABNORMAL HIGH (ref 0.1–1.0)
Monocytes Relative: 6 %
Monocytes Relative: 9 %
Monocytes Relative: 12 %
Neutro Abs: 17.7 10*3/uL — ABNORMAL HIGH (ref 1.7–7.7)
Neutro Abs: 19.7 10*3/uL — ABNORMAL HIGH (ref 1.7–7.7)
Neutro Abs: 17 10*3/uL — ABNORMAL HIGH (ref 1.7–7.7)
Neutrophils Relative %: 89 %
Neutrophils Relative %: 85 %
Neutrophils Relative %: 80 %
Platelets: 274 10*3/uL (ref 150–400)
Platelets: 246 10*3/uL (ref 150–400)
Platelets: 245 10*3/uL (ref 150–400)
RBC: 3.81 MIL/uL — ABNORMAL LOW (ref 4.22–5.81)
RBC: 2.82 MIL/uL — ABNORMAL LOW (ref 4.22–5.81)
RBC: 2.87 MIL/uL — ABNORMAL LOW (ref 4.22–5.81)
RDW: 18.1 % — ABNORMAL HIGH (ref 11.5–15.5)
RDW: 17.9 % — ABNORMAL HIGH (ref 11.5–15.5)
RDW: 18.2 % — ABNORMAL HIGH (ref 11.5–15.5)
WBC: 19.7 10*3/uL — ABNORMAL HIGH (ref 4.0–10.5)
WBC: 23.1 10*3/uL — ABNORMAL HIGH (ref 4.0–10.5)
WBC: 21.1 10*3/uL — ABNORMAL HIGH (ref 4.0–10.5)
nRBC: 0 % (ref 0.0–0.2)
nRBC: 0 % (ref 0.0–0.2)
nRBC: 0 % (ref 0.0–0.2)

## 2020-05-15 LAB — BASIC METABOLIC PANEL
Anion gap: 16 — ABNORMAL HIGH (ref 5–15)
Anion gap: 16 — ABNORMAL HIGH (ref 5–15)
BUN: 37 mg/dL — ABNORMAL HIGH (ref 8–23)
BUN: 41 mg/dL — ABNORMAL HIGH (ref 8–23)
CO2: 22 mmol/L (ref 22–32)
CO2: 22 mmol/L (ref 22–32)
Calcium: 8.6 mg/dL — ABNORMAL LOW (ref 8.9–10.3)
Calcium: 8.4 mg/dL — ABNORMAL LOW (ref 8.9–10.3)
Chloride: 104 mmol/L (ref 98–111)
Chloride: 105 mmol/L (ref 98–111)
Creatinine, Ser: 2.03 mg/dL — ABNORMAL HIGH (ref 0.61–1.24)
Creatinine, Ser: 1.99 mg/dL — ABNORMAL HIGH (ref 0.61–1.24)
GFR calc Af Amer: 39 mL/min — ABNORMAL LOW (ref >60)
GFR calc non Af Amer: 33 mL/min — ABNORMAL LOW (ref >60)
GFR calc non Af Amer: 34 mL/min — ABNORMAL LOW (ref >60)
Glucose, Bld: 230 mg/dL — ABNORMAL HIGH (ref 70–99)
Glucose, Bld: 259 mg/dL — ABNORMAL HIGH (ref 70–99)
Potassium: 3.7 mmol/L (ref 3.5–5.1)
Potassium: 4 mmol/L (ref 3.5–5.1)
Sodium: 142 mmol/L (ref 135–145)
Sodium: 143 mmol/L (ref 135–145)

## 2020-05-15 LAB — GLUCOSE, CAPILLARY
Glucose-Capillary: 192 mg/dL — ABNORMAL HIGH (ref 70–99)
Glucose-Capillary: 205 mg/dL — ABNORMAL HIGH (ref 70–99)
Glucose-Capillary: 243 mg/dL — ABNORMAL HIGH (ref 70–99)
Glucose-Capillary: 249 mg/dL — ABNORMAL HIGH (ref 70–99)

## 2020-05-15 LAB — PROCALCITONIN: Procalcitonin: 7.46 ng/mL

## 2020-05-15 LAB — CULTURE, BLOOD (ROUTINE X 2): Special Requests: ADEQUATE

## 2020-05-15 LAB — COMPREHENSIVE METABOLIC PANEL
ALT: 17 U/L (ref 0–44)
AST: 14 U/L — ABNORMAL LOW (ref 15–41)
Albumin: 2.3 g/dL — ABNORMAL LOW (ref 3.5–5.0)
Alkaline Phosphatase: 47 U/L (ref 38–126)
Anion gap: 14 (ref 5–15)
BUN: 39 mg/dL — ABNORMAL HIGH (ref 8–23)
CO2: 22 mmol/L (ref 22–32)
Calcium: 8.3 mg/dL — ABNORMAL LOW (ref 8.9–10.3)
Chloride: 104 mmol/L (ref 98–111)
Creatinine, Ser: 2.06 mg/dL — ABNORMAL HIGH (ref 0.61–1.24)
GFR calc Af Amer: 38 mL/min — ABNORMAL LOW (ref >60)
GFR calc non Af Amer: 33 mL/min — ABNORMAL LOW (ref >60)
Glucose, Bld: 296 mg/dL — ABNORMAL HIGH (ref 70–99)
Potassium: 3.6 mmol/L (ref 3.5–5.1)
Sodium: 140 mmol/L (ref 135–145)
Total Bilirubin: 0.8 mg/dL (ref 0.3–1.2)
Total Protein: 7.1 g/dL (ref 6.5–8.1)

## 2020-05-15 LAB — LACTIC ACID, PLASMA
Lactic Acid, Venous: 4.5 mmol/L (ref 0.5–1.9)
Lactic Acid, Venous: 3.8 mmol/L (ref 0.5–1.9)
Lactic Acid, Venous: 4.1 mmol/L (ref 0.5–1.9)
Lactic Acid, Venous: 3 mmol/L (ref 0.5–1.9)
Lactic Acid, Venous: 2.9 mmol/L (ref 0.5–1.9)

## 2020-05-15 LAB — PHOSPHORUS: Phosphorus: 3.5 mg/dL (ref 2.5–4.6)

## 2020-05-15 LAB — TROPONIN I (HIGH SENSITIVITY)
Troponin I (High Sensitivity): 55 ng/L — ABNORMAL HIGH (ref <18)
Troponin I (High Sensitivity): 103 ng/L (ref <18)
Troponin I (High Sensitivity): 97 ng/L — ABNORMAL HIGH (ref <18)
Troponin I (High Sensitivity): 153 ng/L (ref <18)
Troponin I (High Sensitivity): 253 ng/L (ref <18)
Troponin I (High Sensitivity): 240 ng/L (ref <18)

## 2020-05-15 LAB — TYPE AND SCREEN
ABO/RH(D): AB POS
Antibody Screen: NEGATIVE

## 2020-05-15 LAB — MRSA PCR SCREENING: MRSA by PCR: POSITIVE — AB

## 2020-05-15 LAB — ABO/RH: ABO/RH(D): AB POS

## 2020-05-15 LAB — MAGNESIUM: Magnesium: 2.3 mg/dL (ref 1.7–2.4)

## 2020-05-15 MED ORDER — LORAZEPAM 2 MG/ML IJ SOLN
1.0000 mg | Freq: Once | INTRAMUSCULAR | Status: AC
  Administered 2020-05-15: 1 mg via INTRAVENOUS
  Filled 2020-05-15: qty 1

## 2020-05-15 MED ORDER — FUROSEMIDE 10 MG/ML IJ SOLN
20.0000 mg | Freq: Once | INTRAMUSCULAR | Status: AC
  Administered 2020-05-15: 20 mg via INTRAVENOUS
  Filled 2020-05-15: qty 2

## 2020-05-15 MED ORDER — MUPIROCIN 2 % EX OINT
1.0000 "application " | TOPICAL_OINTMENT | Freq: Two times a day (BID) | CUTANEOUS | Status: AC
  Administered 2020-05-15 – 2020-05-19 (×10): 1 via NASAL
  Filled 2020-05-15: qty 22

## 2020-05-15 MED ORDER — INSULIN ASPART 100 UNIT/ML ~~LOC~~ SOLN
0.0000 [IU] | Freq: Three times a day (TID) | SUBCUTANEOUS | Status: DC
  Administered 2020-05-15 (×2): 3 [IU] via SUBCUTANEOUS
  Administered 2020-05-16: 1 [IU] via SUBCUTANEOUS
  Administered 2020-05-16: 2 [IU] via SUBCUTANEOUS
  Administered 2020-05-16 (×2): 1 [IU] via SUBCUTANEOUS
  Administered 2020-05-17: 2 [IU] via SUBCUTANEOUS
  Administered 2020-05-17: 1 [IU] via SUBCUTANEOUS
  Administered 2020-05-17 (×2): 5 [IU] via SUBCUTANEOUS
  Administered 2020-05-18 (×3): 2 [IU] via SUBCUTANEOUS
  Administered 2020-05-18 – 2020-05-19 (×2): 1 [IU] via SUBCUTANEOUS
  Administered 2020-05-19: 3 [IU] via SUBCUTANEOUS
  Administered 2020-05-19: 5 [IU] via SUBCUTANEOUS
  Administered 2020-05-19 – 2020-05-20 (×2): 2 [IU] via SUBCUTANEOUS
  Administered 2020-05-20: 1 [IU] via SUBCUTANEOUS
  Administered 2020-05-20 – 2020-05-21 (×3): 2 [IU] via SUBCUTANEOUS
  Administered 2020-05-21: 3 [IU] via SUBCUTANEOUS
  Administered 2020-05-21: 1 [IU] via SUBCUTANEOUS
  Administered 2020-05-21: 3 [IU] via SUBCUTANEOUS
  Administered 2020-05-22 (×3): 2 [IU] via SUBCUTANEOUS
  Administered 2020-05-22: 5 [IU] via SUBCUTANEOUS
  Administered 2020-05-23: 2 [IU] via SUBCUTANEOUS
  Administered 2020-05-23: 3 [IU] via SUBCUTANEOUS
  Administered 2020-05-23 – 2020-05-24 (×4): 2 [IU] via SUBCUTANEOUS

## 2020-05-15 MED ORDER — OXYCODONE HCL 5 MG PO TABS
5.0000 mg | ORAL_TABLET | ORAL | Status: DC | PRN
  Administered 2020-05-15 – 2020-05-24 (×50): 5 mg via ORAL
  Filled 2020-05-15 (×51): qty 1

## 2020-05-15 MED ORDER — FENTANYL CITRATE (PF) 100 MCG/2ML IJ SOLN
25.0000 ug | Freq: Once | INTRAMUSCULAR | Status: AC
  Administered 2020-05-15: 25 ug via INTRAVENOUS
  Filled 2020-05-15: qty 2

## 2020-05-15 MED ORDER — LABETALOL HCL 5 MG/ML IV SOLN
5.0000 mg | Freq: Once | INTRAVENOUS | Status: AC
  Administered 2020-05-15: 5 mg via INTRAVENOUS
  Filled 2020-05-15: qty 4

## 2020-05-15 MED ORDER — LABETALOL HCL 5 MG/ML IV SOLN
10.0000 mg | INTRAVENOUS | Status: DC | PRN
  Administered 2020-05-15 – 2020-05-19 (×6): 10 mg via INTRAVENOUS
  Filled 2020-05-15 (×8): qty 4

## 2020-05-15 MED ORDER — HYDRALAZINE HCL 25 MG PO TABS
25.0000 mg | ORAL_TABLET | ORAL | Status: DC | PRN
  Administered 2020-05-15 – 2020-05-16 (×2): 25 mg via ORAL
  Filled 2020-05-15 (×2): qty 1

## 2020-05-15 MED ORDER — LACTATED RINGERS IV BOLUS
1000.0000 mL | Freq: Once | INTRAVENOUS | Status: AC
  Administered 2020-05-15: 1000 mL via INTRAVENOUS

## 2020-05-15 MED ORDER — PREDNISONE 5 MG PO TABS
5.0000 mg | ORAL_TABLET | Freq: Every day | ORAL | Status: DC
  Administered 2020-05-15 – 2020-05-24 (×10): 5 mg via ORAL
  Filled 2020-05-15 (×10): qty 1

## 2020-05-15 MED ORDER — IOHEXOL 350 MG/ML SOLN
100.0000 mL | Freq: Once | INTRAVENOUS | Status: AC | PRN
  Administered 2020-05-15: 75 mL via INTRAVENOUS

## 2020-05-15 MED ORDER — NITROGLYCERIN 0.4 MG SL SUBL
SUBLINGUAL_TABLET | SUBLINGUAL | Status: AC
  Filled 2020-05-15: qty 1

## 2020-05-15 NOTE — TOC Initial Note (Signed)
Transition of Care [facility]) - Initial/Assessment Note    Patient Details  Name: Marco [name] MRN: [id number] Date of Birth: 04-Jan-1956  [facility] CM/SW Contact:    Leeroy Cha, RN Phone Number: 05/15/2020, 9:00 AM  Clinical Narrative:                 bld and urine cultures are + for E. Coli, lactic acid 4.1, wbc 19.7, From home with wife plan is to return to home. Following progression and toc needs.  Expected Discharge Plan: Home/Self Care Barriers to Discharge: Continued Medical Work up   Patient Goals and CMS Choice Patient states their goals for this hospitalization and ongoing recovery are:: to go back home with my wife CMS Medicare.gov Compare Post Acute Care list provided to:: Patient Choice offered to / list presented to : Patient  Expected Discharge Plan and Services Expected Discharge Plan: Home/Self Care   Discharge Planning Services: CM Consult   Living arrangements for the past 2 months: Single Family Home                                      Prior Living Arrangements/Services Living arrangements for the past 2 months: Single Family Home Lives with:: Spouse Patient language and need for interpreter reviewed:: Yes Do you feel safe going back to the place where you live?: Yes      Need for Family Participation in Patient Care: Yes (Comment) Care giver support system in place?: Yes (comment)   Criminal Activity/Legal Involvement Pertinent to Current Situation/Hospitalization: No - Comment as needed  Activities of Daily Living Home Assistive Devices/Equipment: Eyeglasses, Walker (specify type), CBG Meter (front wheeled walker) ADL Screening (condition at time of admission) Patient's cognitive ability adequate to safely complete daily activities?: No (patient lethargic but will answer questions) Is the patient deaf or have difficulty hearing?: No Does the patient have difficulty seeing, even when wearing glasses/contacts?:  No Does the patient have difficulty concentrating, remembering, or making decisions?: Yes Patient able to express need for assistance with ADLs?: Yes Does the patient have difficulty dressing or bathing?: Yes Independently performs ADLs?: No Communication: Independent Dressing (OT): Needs assistance Is this a change from baseline?: Change from baseline, expected to last >3 days Grooming: Needs assistance Is this a change from baseline?: Change from baseline, expected to last >3 days Feeding: Needs assistance Is this a change from baseline?: Change from baseline, expected to last >3 days Bathing: Needs assistance Is this a change from baseline?: Change from baseline, expected to last >3 days Toileting: Dependent Is this a change from baseline?: Change from baseline, expected to last >3days In/Out Bed: Dependent Is this a change from baseline?: Change from baseline, expected to last >3 days Walks in Home: Dependent Is this a change from baseline?: Change from baseline, expected to last >3 days Does the patient have difficulty walking or climbing stairs?: Yes (secondary to weakness) Weakness of Legs: Both Weakness of Arms/Hands: None  Permission Sought/Granted                  Emotional Assessment Appearance:: Appears stated age Attitude/Demeanor/Rapport: Engaged Affect (typically observed): Apprehensive Orientation: : Oriented to Self, Oriented to Place, Oriented to  Time, Oriented to Situation Alcohol / Substance Use: Not Applicable Psych Involvement: No (comment)  Admission diagnosis:  Renal abscess [N15.1] Ureteral stone [N20.1] Abscess [L02.91] Severe sepsis ([place]) [A41.9, R65.20] Sepsis with  acute renal failure, due to unspecified organism, unspecified acute renal failure type, unspecified whether septic shock present ([place]) [A41.9, R65.20, N17.9] Patient Active Problem List   Diagnosis Date Noted   Perinephric abscess 05/13/2020   Pyohydronephrosis 05/13/2020    Vitamin B12 deficiency 02/19/2020   Elevated LFTs    Pleural effusion    Weakness    Loculated pleural effusion    Complex renal cyst    Urethral stricture 12/30/2019   Nephrolithiasis 12/30/2019   Acute febrile illness 12/30/2019   Sepsis ([place]) 12/27/2019   Severe sepsis ([place]) 95/04/3266   Acute metabolic encephalopathy 12/45/8099   Sepsis due to Gram negative bacteria ([place]) 12/09/2019   Rhinitis 10/16/2019   Gastroesophageal reflux disease without esophagitis 08/31/2018   Muscle spasm 08/31/2018   NICM (nonischemic cardiomyopathy) ([place]) 07/06/2018   Hypokalemia    Acute renal failure superimposed on stage 3 chronic kidney disease ([place]) 08/27/2017   Hyperlipidemia 12/23/2016   Chronic combined systolic and diastolic CHF (congestive heart failure) ([place]) 09/09/2016   Acute respiratory failure with hypoxia ([place]) 09/02/2016   Diabetic polyneuropathy associated with type 2 diabetes mellitus ([place]) 06/10/2016   Vision changes 06/02/2016   Myalgia and myositis 03/31/2016   Shortness of breath 02/09/2016   Chronic gouty arthritis 11/29/2015   LBP (low back pain) 11/27/2015   Arthralgia of multiple joints 11/27/2015   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[place]) 11/27/2015   Breast pain in male 11/14/2015   Sinusitis, chronic 10/16/2015   Insomnia 10/16/2015   New onset of headaches after age 76 09/20/2015   OSA (obstructive sleep apnea) 07/12/2015   Low serum testosterone level 05/18/2015   Depression 05/16/2015   Morbid obesity due to excess calories ([facility])    Chronic pain    Type 2 diabetes mellitus with stage 3 chronic kidney disease, without long-term current use of insulin ([place])    Rheumatoid arthritis ([place]) 02/05/2015   CAD (coronary artery disease) 12/24/2014   Atrial fibrillation ([place]) 12/24/2014   Essential hypertension 12/24/2014   Chronic kidney disease 12/24/2014   PUD (peptic ulcer disease) 12/24/2014   PCP:  [name],  MD Pharmacy:   [place], [place] - [address] [place] [address] Phone: [phone] Fax: [phone]     Social Determinants of Health (SDOH) Interventions    Readmission Risk Interventions Readmission Risk Prevention Plan 12/28/2019  Transportation Screening Complete  PCP or Specialist Appt within 3-5 Days Complete  HRI or Home Care Consult Complete  Social Work Consult for [place] Planning/Counseling Complete  Palliative Care Screening Not Applicable  Medication Review Press photographer) Complete  Some recent data might be hidden

## 2020-05-15 NOTE — Progress Notes (Signed)
Inpatient Diabetes Program Recommendations  AACE/ADA: New Consensus Statement on Inpatient Glycemic Control (2015)  Target Ranges:  Prepandial:   less than 140 mg/dL      Peak postprandial:   less than 180 mg/dL (1-2 hours)      Critically ill patients:  140 - 180 mg/dL   Lab Results  Component Value Date   GLUCAP 249 (H) 05/15/2020   HGBA1C 8.4 (H) 12/08/2019    Review of Glycemic Control Results for BRENDAN, GADSON (MRN [id number]) as of 05/15/2020 13:05  Ref. Range 05/15/2020 00:37 05/15/2020 12:47  Glucose-Capillary Latest Ref Range: 70 - 99 mg/dL 192 (H) 249 (H)   Diabetes history: DM 2 Outpatient Diabetes medications: Humalog 75/25 100 units Daily Current orders for Inpatient glycemic control: None  Inpatient Diabetes Program Recommendations:    Pt without correction scale insulin, Glucose mid 200 range. Hydrocortisone changed to PO prednisone 5 mg. Renal function elevated.  -  Consider Novolog 0-9 units tid + hs scale.  Thanks,  Tama Headings RN, MSN, BC-ADM Inpatient Diabetes Coordinator Team Pager [phone] (8a-5p)

## 2020-05-15 NOTE — Progress Notes (Signed)
Troponin went up from 55 to 103. Paged Sharlet Salina NP and awaiting a response. Patient denies chest pain. Will continue to monitor

## 2020-05-15 NOTE — Progress Notes (Signed)
CRITICAL VALUE ALERT  Critical Value:  Trop 253 La 3.0 (improved  Date & Time Notied:  05/14/20 1316  Provider Notified: [place]  Orders Received/Actions taken: echo ordered

## 2020-05-15 NOTE — Progress Notes (Signed)
Noted abnormalities on the cardiac monitor, checked on patient and noted him to having SOB, wheezing and diaphoretic.  Notified charge RN and respiratory as well as Sharlet Salina NP. Multiple orders received from [place] NP as indicated in epic and implemented them. Patient c/o chest pain and before giving any NTG he denies  It . Patient's wife updated on his status. A & O with intermittent confusion. Will continue to monitor.

## 2020-05-15 NOTE — Progress Notes (Signed)
Referring Physician(s): M. Pace  Supervising Physician: Jacqulynn Cadet  Patient Status:  [facility] - In-pt  Chief Complaint:  AMS, Sepsis found to have a recurrent - persistent perinephric abscess. IR placed a right perinephric abscess drain on 10.3.21 by Dr. Earleen Newport  Subjective:  Patient reports pain at exit site worse with movement and states that he is 'seeing things"  Allergies: Nsaids and Jardiance [empagliflozin]  Medications: Prior to Admission medications   Medication Sig Start Date End Date Taking? Authorizing Provider  albuterol (PROVENTIL HFA;VENTOLIN HFA) 108 (90 Base) MCG/ACT inhaler Inhale 2 puffs into the lungs every 6 (six) hours as needed for wheezing or shortness of breath. 04/29/18  Yes [name], MD  allopurinol (ZYLOPRIM) 100 MG tablet Take 200 mg by mouth every evening.    Yes [provider]  aspirin (EQ ASPIRIN ADULT LOW DOSE) 81 MG EC tablet TAKE 1 TABLET BY MOUTH ONCE DAILY. SWALLOW WHOLE Patient taking differently: Take 81 mg by mouth daily. TAKE 1 TABLET BY MOUTH ONCE DAILY. SWALLOW WHOLE 02/29/20  Yes [name], MD  atorvastatin (LIPITOR) 40 MG tablet Take 1 tablet (40 mg total) by mouth daily. 09/27/19  Yes Sueanne Margarita, MD  carvedilol (COREG) 12.5 MG tablet Take 1 tablet (12.5 mg total) by mouth 2 (two) times daily with a meal. 09/12/19  Yes Larey Dresser, MD  cyclobenzaprine (FLEXERIL) 5 MG tablet Take 5 mg by mouth every 12 (twelve) hours. 04/05/20  Yes [provider]  diclofenac Sodium (VOLTAREN) 1 % GEL Apply 4 g topically 4 (four) times daily. Patient taking differently: Apply 4 g topically 4 (four) times daily as needed (pain).  10/23/19  Yes Darr, Marguerita Beards, [name]  DULoxetine (CYMBALTA) 20 MG capsule TAKE 1 CAPSULE BY MOUTH ONCE DAILY Patient taking differently: Take 20 mg by mouth daily.  03/27/20  Yes [name], MD  famotidine (PEPCID) 20 MG tablet TAKE 1 TABLET BY MOUTH TWO TIMES DAILY Patient taking  differently: Take 20 mg by mouth 2 (two) times daily.  04/18/20  Yes [name], MD  ferrous sulfate 325 (65 FE) MG tablet Take 1 tablet (325 mg total) by mouth 2 (two) times daily with a meal. 01/14/20  Yes Lavina Hamman, MD  furosemide (LASIX) 80 MG tablet Take 80 mg by mouth daily.    Yes [provider]  Insulin Lispro Prot & Lispro (HUMALOG MIX 75/25 KWIKPEN) (75-25) 100 UNIT/ML Kwikpen Inject 100 Units into the skin daily with breakfast. 12/20/19  Yes Renato Shin, MD  isosorbide mononitrate (IMDUR) 30 MG 24 hr tablet Take 30 mg by mouth daily. 01/19/20  Yes [provider]  naloxone [facility]) nasal spray 4 mg/0.1 mL Place in nostril in the event of suspected overdose on Narcotic medication (Oxycodone) 02/16/20  Yes [name], MD  oxyCODONE (OXY IR/ROXICODONE) 5 MG immediate release tablet Take 5 mg by mouth 3 (three) times daily as needed for moderate pain.  01/24/20  Yes [provider]  polyethylene glycol (MIRALAX / GLYCOLAX) 17 g packet Take 17 g by mouth daily as needed for moderate constipation or severe constipation. 01/14/20  Yes Lavina Hamman, MD  Potassium Chloride ER 20 MEQ TBCR Take 20 mEq by mouth daily.  01/15/20  Yes [provider]  predniSONE (DELTASONE) 5 MG tablet Take 5 mg by mouth daily. 11/23/19  Yes [provider]  pregabalin (LYRICA) 100 MG capsule Take 100 mg by mouth 3 (three) times daily.  Yes [provider]  ramelteon (ROZEREM) 8 MG tablet TAKE 1 TABLET BY MOUTH AT BEDTIME Patient taking differently: Take 8 mg by mouth at bedtime.  04/18/20  Yes [name], MD  Secukinumab (COSENTYX) 150 MG/ML SOSY Inject 150 mg into the skin every 28 (twenty-eight) days. Hold until told to resume by PCP 01/14/20  Yes Lavina Hamman, MD  Tafamidis [facility]) 61 MG CAPS Take 61 mg by mouth daily. 09/08/19  Yes Bensimhon, Shaune Pascal, MD  Accu-Chek FastClix Lancets MISC 1 each by Other route 2 (two) times daily. E11.42  03/27/20   Renato Shin, MD  ACCU-CHEK GUIDE test strip USE 1 STRIP TWICE DAILY TO CHECK BLOOD GLUCOSE 02/13/20   Renato Shin, MD  Insulin Pen Needle 32G X 4 MM MISC 1 each by Does not apply route daily. E11.9 03/06/20   Renato Shin, MD  sacubitril-valsartan (ENTRESTO) 24-26 MG Take 1 tablet by mouth 2 (two) times daily. 05/11/20   Bensimhon, Shaune Pascal, MD     Vital Signs: BP (!) 172/92 (BP Location: Left Leg)   Pulse 95   Temp 98.2 F (36.8 C) (Oral)   Resp (!) 42   Wt 234 lb 2.1 oz (106.2 kg)   SpO2 (!) 87% Comment: nurse aware. Oxygen was applied  BMI 37.79 kg/m   Physical Exam Vitals and nursing note reviewed.  Constitutional:      Appearance: He is well-developed.  HENT:     Head: Normocephalic.  Cardiovascular:     Rate and Rhythm: Normal rate and regular rhythm.  Pulmonary:     Effort: Pulmonary effort is normal.  Genitourinary:    Comments: right perinephric abscess drain with scant sanguinous output noted in bulb.  Musculoskeletal:        General: Normal range of motion.     Cervical back: Normal range of motion.  Skin:    General: Skin is dry.  Neurological:     Mental Status: He is alert.     Comments: Orient to person     Imaging: DG Chest Port 1 View  Result Date: 05/13/2020 CLINICAL DATA:  Fever EXAM: PORTABLE CHEST 1 VIEW COMPARISON:  May 10, 2020 FINDINGS: The heart size and mediastinal contours are unchanged with mild cardiomegaly. There is streaky airspace opacity at the right base as the CT, likely atelectasis scarring. The left lung is clear. No acute osseous. IMPRESSION: Unchanged airspace opacity at the right base, likely atelectasis or scarring Electronically Signed   By: Prudencio Pair M.D.   On: 05/13/2020 00:34   CT RENAL STONE STUDY  Result Date: 05/15/2020 CLINICAL DATA:  Sepsis EXAM: CT ABDOMEN AND PELVIS WITHOUT CONTRAST TECHNIQUE: Multidetector CT imaging of the abdomen and pelvis was performed following the standard protocol without  IV contrast. COMPARISON:  05/13/2020. FINDINGS: Lower chest: Small bilateral pleural effusions. Bibasilar consolidative opacities with centrilobular ground-glass nodularity of the RIGHT lower lobe. Hepatobiliary: Unchanged wedge-shaped hypodense lesions of the LEFT liver. Unchanged subcapsular lesion of the inferior RIGHT liver, better assessed on prior MRI. Small amount of fluid tracking along the liver, minimally increased. Excreted contrast versus gallstones within the gallbladder. Pancreas: No new focal fat stranding. Spleen: No new focal abnormality. Adrenals/Urinary Tract: Interval placement of a drain in the RIGHT perinephric abscess. There is new high density material in the Perirenal fossa which distorts the contours of the kidney. High density material tracks in the retroperitoneum. There is a small amount of layering seen within this high density material which could reflect  active bleeding. Unchanged hypodense lesion of the LEFT kidney. Adrenals are unremarkable. Bladder is partially decompressed around a Foley catheter. Small amount of intraluminal air consistent with intervention. Stomach/Bowel: No evidence of bowel obstruction. Diverticulosis without evidence of diverticulitis. Status post RIGHT colonic resection. Vascular/Lymphatic: Atherosclerotic calcifications of the aorta. No new suspicious lymph nodes. Reproductive: Coarse calcifications of the prostate. Other: Small fat containing inguinal hernias. Small amount of free fluid. Musculoskeletal: Remote LEFT rib fracture of the posterior twelfth rib. IMPRESSION: 1. Interval placement of a drain in the RIGHT perinephric abscess. There is new high density material in the perirenal fossa which distorts the kidney (Page kidney morphology). Blood products track in the retroperitoneum. There is a small amount of layering blood products seen which could reflect active bleeding. Consider dedicated contrast enhanced CT to evaluate for active extravasation  or pseudoaneurysm formation. 2. Small bilateral pleural effusions. 3. Bibasilar consolidative opacities with centrilobular ground-glass nodularity of the RIGHT lower lobe. Findings may reflect aspiration superimposed on atelectasis. Differential considerations include infection. These results were called by telephone at the time of interpretation on 05/15/2020 at 12:37 pm to provider DAVID GIRGUIS , who verbally acknowledged these results. Aortic Atherosclerosis (ICD10-I70.0). Electronically Signed   By: Valentino Saxon MD   On: 05/15/2020 12:47   CT Renal Stone Study  Result Date: 05/13/2020 CLINICAL DATA:  Fever, question of UTI EXAM: CT ABDOMEN AND PELVIS WITHOUT CONTRAST TECHNIQUE: Multidetector CT imaging of the abdomen and pelvis was performed following the standard protocol without IV contrast. COMPARISON:  MRI of 07/30/2020 FINDINGS: Lower chest: The visualized heart size within normal limits. There is a small pericardial effusion present. Trace bilateral pleural effusions/atelectasis is noted. Hepatobiliary: There are new heterogeneously hypodense lesions seen within the liver parenchyma. The largest in the posterior right liver lobe measures 5.7 cm. Within the anterior left liver lobe there to hypodense lesions the largest measuring 3.4 cm. No evidence of calcified gallstones or biliary ductal dilatation. Pancreas:  Unremarkable.  No surrounding inflammatory changes. Spleen: Normal in size. Although limited due to the lack of intravenous contrast, normal in appearance. Adrenals/Urinary Tract: Both adrenal glands appear normal. Again noted is a complex collection seen posterior to the right kidney measuring 4.5 cm in transverse dimension. There is heterogeneous fluid seen within the central portion and small foci of air now noted. A punctate calcifications seen in the upper pole of the right kidney. No hydronephrosis. There is mild left-sided perinephric fat stranding changes. Within the proximal left  ureter there is a 4 mm calculus present. A 2 cm low-density lesion seen off the lower pole the left kidney. Stomach/Bowel: The stomach, small bowel, and colon are normal in appearance. No inflammatory changes or obstructive findings. Scattered colonic diverticula are noted. A moderate amount of colonic stool is present. Vascular/Lymphatic: There are no enlarged abdominal or pelvic lymph nodes. Scattered aortic atherosclerotic calcifications are seen without aneurysmal dilatation. Reproductive: The prostate is unremarkable. Other: No evidence of abdominal wall mass or hernia. Musculoskeletal: No acute or significant osseous findings. IMPRESSION: Heterogeneous complex collection posterior to the right kidney now with new foci of air, however appears grossly unchanged in size from the prior exam dating back to August 2021. This likely represents of para renal abscess given the patient's history. New/enlarging hypodense lesions seen throughout the liver parenchyma which could represent hepatic abscesses versus metastatic disease. Mild left perinephric inflammatory changes, likely due to a proximal left 4 mm renal calculus. Small pericardial effusion Electronically Signed   By: Kerby Moors  Avutu M.D.   On: 05/13/2020 02:42   CT IMAGE GUIDED FLUID DRAIN BY CATHETER  Result Date: 05/13/2020 INDICATION: 64 year old male with recurrence/persisting abscess in the right perinephric space. He presents with sepsis and has been referred for percutaneous drainage EXAM: CT GUIDED DRAINAGE OF  ABSCESS MEDICATIONS: The patient is currently admitted to the hospital and receiving intravenous antibiotics. The antibiotics were administered within an appropriate time frame prior to the initiation of the procedure. ANESTHESIA/SEDATION: 0.5 mg IV Versed 25 mcg IV Fentanyl Moderate Sedation Time:  12 minutes The patient was continuously monitored during the procedure by the interventional radiology nurse under my direct supervision.  COMPLICATIONS: None TECHNIQUE: Informed written consent was obtained from the patient after a thorough discussion of the procedural risks, benefits and alternatives. All questions were addressed. Maximal Sterile Barrier Technique was utilized including caps, mask, sterile gowns, sterile gloves, sterile drape, hand hygiene and skin antiseptic. A timeout was performed prior to the initiation of the procedure. PROCEDURE: Patient was positioned left decubitus on the CT table. Scout CT was acquired. The right flank was prepped with chlorhexidine in a sterile fashion, and a sterile drape was applied covering the operative field. A sterile gown and sterile gloves were used for the procedure. Local anesthesia was provided with 1% Lidocaine. Once the patient is prepped and draped in the usual sterile fashion, CT guidance was used to advance a trocar needle into the phlegmon of the right perinephric space. Once we confirmed needle tip position with aspiration of purulent fluid, wire was placed and modified Seldinger technique was used to place a 10 [place] drain. Drain position was optimized with serial CT imaging. Drain was then sutured in position attached to bulb suction. Sample was sent to lab for culture. Patient tolerated the procedure well and remained hemodynamically stable throughout. No complications were encountered and no significant blood loss. FINDINGS: Ten French drain into the left perinephric abscess. Uncertain if there is communication between the suspected perihepatic abscess and the perinephric collection. IMPRESSION: Status post CT-guided drainage of right perinephric abscess. Signed, Dulcy Fanny. Dellia Nims, RPVI [facility] Electronically Signed   By: Corrie Mckusick D.O.   On: 05/13/2020 09:07    Labs:  CBC: Recent Labs    05/13/20 0700 05/14/20 0415 05/15/20 0055 05/15/20 1316  WBC 27.1* 24.2* 19.7* 23.1*  HGB 10.1* 10.0* 10.6* 8.0*    HCT 32.2* 31.3* 33.9* 24.8*  PLT 283 286 274 246    COAGS: Recent Labs    12/08/19 2014 12/30/19 1106 05/13/20 0054 05/13/20 0700  INR 1.3* 1.2 1.3* 1.4*  APTT 33  --  31  --     BMP: Recent Labs    05/13/20 0700 05/13/20 0700 05/14/20 0509 05/15/20 0055 05/15/20 0344 05/15/20 1316  NA 139   < > 139 142 140 143  K 5.4*   < > 4.5 3.7 3.6 4.0  CL 104   < > 108 104 104 105  CO2 23   < > 23 22 22 22   GLUCOSE 162*   < > 154* 230* 296* 259*  BUN 31*   < > 30* 37* 39* 41*  CALCIUM 7.9*   < > 7.6* 8.6* 8.3* 8.4*  CREATININE 2.46*   < > 1.80* 2.03* 2.06* 1.99*  GFRNONAA 26*   < > 39* 33* 33* 34*  GFRAA 31*  --  45* 39* 38*  --    < > = values in this interval not displayed.  LIVER FUNCTION TESTS: Recent Labs    02/16/20 1218 05/13/20 0054 05/13/20 0700 05/15/20 0344  BILITOT 0.2 1.2 1.2 0.8  AST 9 20 16  14*  ALT 14 23 19 17   ALKPHOS 75 59 45 47  PROT 8.1 7.7 6.6 7.1  ALBUMIN 4.0 2.8* 2.3* 2.3*    Assessment and Plan:  64 y.o. male inpatient. History of DM, HTN, CHF/ Presented to the ED at [facility] wih AMS, leukocytosis and hypotensive. CT from 10.3.21 shows a recurrent - persistent right perinephric abscess. On 10.3.21  IR placed a 10 Fr drain in the right perinephric space.  Prior drain placement was on 5.23.21. Per EPIC output is 625 ml with 10 mls being over night. Scant sanguinous fluid noted in JP bulb.   Patient reporting CP and SHOB with worsening abdominal pain. CT renal reads Adrenals/Urinary Tract: Interval placement of a drain in the RIGHT perinephric abscess. There is new high density material in the Perirenal fossa which distorts the contours of the kidney. High density material tracks in the retroperitoneum. There is a small amount of layering seen within this high density material which could reflect active bleeding.    Hgb 8.0 down trending, WBC is 23.1, BUN 41, Cr 1.99.   Case discussed with IR Attending Dr. Shellia Cleverly. CTA  shows no active  extravasation with an enlarging hematoma. In anticipation of perinephric drain tamponading off the hematoma recommend Team terminate flushing the drain at this time.  IR to continue to follow.  Electronically Signed: Jacqualine Mau, NP 05/15/2020, 4:04 PM

## 2020-05-15 NOTE — Progress Notes (Signed)
PROGRESS NOTE    Marco [name]   CZY:[id number]  DOB: 04-09-1956  DOA: 05/12/2020     2  PCP: [name], MD  CC: AMS  Hospital Course: Marco [name] is a 64 yo AA male with PMH DMII, HTN, dCHF (EF most recently 50-55% in May, grade 1 DD), RA on biologic therapy.  Patient was admitted in April / May most recently.  Had sepsis, hypotension at that time secondary to UTI with a R perinephric abscess (3.9 cm at that time).  Drainage of perinephric abscess in late May grew out E.Coli sensitive to cefepime, rocephin.  He had an MRI abdomen to follow up in Aug which demonstrated worsening of the R paranephric abscess (see MR result from that time).   Patient presents to the ED with AMS, patient not able to provide much in the way of history due to lethargy.  In the ER he was febrile, 105.2 and developed hypotension.  WBC 23.6, Creat up to 2.3 (baseline 1.3).  UA consistent with infection CT abd/pelvis revealed right perinephric abscess and left renal calculus.  Urology and IR were consulted. He underwent placement of right perc drain to the perinephric abscess with 21F pigtail drain on 05/13/20.  Blood and urine cultures were obtained on admission as well as culture from perinephric abscess which was sent after drain placement. He was initially treated with vancomycin, cefepime, Flagyl in the ER and continued on cefepime and flagyl while cultures further developed. Blood, urine, and perinephric abscess cultures all returned positive with E. coli.  Antibiotics were deescalated down to Rocephin and Flagyl while awaiting further anaerobic culture results.  Overnight of 05/14/2020 he had some chest pain and shortness of breath/wheezing. He underwent work-up with EKG, troponin. His EKG had revealed sinus tach with PVCs. Trop was elevated however has been elevated in the past; it was considered in setting of sepsis and demand ischemia.  He had worsening abdominal pain on 10/5 and also  underwent repeat CT abd/pelvis.    Interval History:  Overnight had CP and SOB. Trops were elevated but has also received a lot of IVF since admission. Net positive 5.4L.  Having more abd pain and distension this am; going down for a CT as well.   Old records reviewed in assessment of this patient  ROS: Constitutional: positive for chills, fatigue and fevers, Respiratory: negative for cough, Cardiovascular: negative for chest pain and Gastrointestinal: positive for abdominal pain  Assessment & Plan: * Severe sepsis (HCC) -Fever, tachycardia, tachypnea, leukocytosis, lactic acidosis, encephalopathy, source of infection considered urinary/perinephric abscess -Given vancomycin, cefepime, Flagyl on admission - Blood, urine, and perinephric abscess cultures growing E. Coli - abx de-escalated to CTX/Flagyl; await further anaerobic growth; prefer to cover for now - PCT downtrending - repeat CT on 10/5 due to worsening abdominal pain and distension   Perinephric abscess - right sided; seen on CT - s/p drain placement with IR on 10/3 - follow up culture results - see severe sepsis  -Follow-up repeat CT on 05/15/2020  Acute renal failure superimposed on stage 3 chronic kidney disease ([place]) -Unclear etiology, but possible contribution from outlet obstruction versus severe sepsis -Patient may still need left-sided percutaneous nephrostomy tube -Continue LR and monitor renal function -Follow-up repeat CT  Elevated troponin -Patient has history of elevated troponins in setting of acute infections. This is considered due to demand. He has recurrent chest pain/shortness of breath which may also be due to ongoing fluid resuscitation from hypotension in setting of  sepsis -Trend troponin but likely due to demand ischemia at this time -Repeat EKG as initial performed overnight had excessive artifact/motion  Nephrolithiasis -CT reveals a 4 mm left-sided ureteral calculus.  Per urology, patient may  still need percutaneous nephrostomy tube -Continue to monitor renal response to fluids and current interventions in place as well as further plans per urology  Acute metabolic encephalopathy -Typically alert and oriented.  Encephalopathy considered in setting of underlying infection  Hyperkalemia-resolved as of 05/14/2020 - continue tele - no indication for treatment at this time - BMP in am   Chronic combined systolic and diastolic CHF (congestive heart failure) ([place]) -Monitor for signs of volume overload while on fluids -Follows outpatient with cardiology  Type 2 diabetes mellitus with stage 3 chronic kidney disease, without long-term current use of insulin ([place]) - continue SSI and CBG monitoring   Rheumatoid arthritis ([place]) -On outpatient treatment -See sepsis.  Was started on steroids as patient is on chronic prednisone 5 mg daily at home as well  Essential hypertension -Holding BP meds in setting of severe sepsis  Atrial fibrillation ([place]) -Holding Coreg in setting of infection -Aspirin on hold in case of need for further interventions   Antimicrobials: Vancomycin 10/3/2021x1 Cefepime 05/13/2020>> 05/13/2020 Rocephin 05/13/2020>> present Flagyl 05/13/2020>> present  DVT prophylaxis: Lovenox Code Status: Full Family Communication: wife on phone  Disposition Plan: Status is: Inpatient  Remains inpatient appropriate because:Altered mental status, Ongoing diagnostic testing needed not appropriate for outpatient work up, Unsafe d/c plan, IV treatments appropriate due to intensity of illness or inability to take PO and Inpatient level of care appropriate due to severity of illness   Dispo: The patient is from: Home              Anticipated d/c is to: pending eventual PT eval and clinical course              Anticipated d/c date is: > 3 days              Patient currently is not medically stable to d/c.  Objective: Blood pressure (!) 176/81, pulse 85, temperature 99.1 F  (37.3 C), resp. rate (!) 34, weight 106.2 kg, SpO2 90 %.  Examination: General appearance: fatigued, no distress and slowed mentation. Oriented to name, place, year, president Head: Normocephalic, without obvious abnormality, atraumatic Eyes: EOMI Lungs: clear to auscultation bilaterally Heart: regular rate and rhythm and S1, S2 normal Abdomen: more distended and TTP throughout; BS are present. No R/G  Back: right perinephric drain in place with serosanguinous fluid in bulb Extremities: no edema Skin: mobility and turgor normal Neurologic: no focal deficits; grossly weak  Consultants:   Urology  IR  Procedures:   Right perinephric abscess drain placement, 05/13/20  Data Reviewed: I have personally reviewed following labs and imaging studies Results for orders placed or performed during the hospital encounter of 05/12/20 (from the past 24 hour(s))  MRSA PCR Screening     Status: Abnormal   Collection Time: 05/14/20  6:32 PM   Specimen: Nasopharyngeal  Result Value Ref Range   MRSA by PCR POSITIVE (A) NEGATIVE  Glucose, capillary     Status: Abnormal   Collection Time: 05/15/20 12:37 AM  Result Value Ref Range   Glucose-Capillary 192 (H) 70 - 99 mg/dL  Basic metabolic panel     Status: Abnormal   Collection Time: 05/15/20 12:55 AM  Result Value Ref Range   Sodium 142 135 - 145 mmol/L   Potassium 3.7 3.5 -  5.1 mmol/L   Chloride 104 98 - 111 mmol/L   CO2 22 22 - 32 mmol/L   Glucose, Bld 230 (H) 70 - 99 mg/dL   BUN 37 (H) 8 - 23 mg/dL   Creatinine, Ser 2.03 (H) 0.61 - 1.24 mg/dL   Calcium 8.6 (L) 8.9 - 10.3 mg/dL   GFR calc non Af Amer 33 (L) >60 mL/min   GFR calc Af Amer 39 (L) >60 mL/min   Anion gap 16 (H) 5 - 15  CBC with Differential/Platelet     Status: Abnormal   Collection Time: 05/15/20 12:55 AM  Result Value Ref Range   WBC 19.7 (H) 4.0 - 10.5 K/uL   RBC 3.81 (L) 4.22 - 5.81 MIL/uL   Hemoglobin 10.6 (L) 13.0 - 17.0 g/dL   HCT 33.9 (L) 39 - 52 %   MCV 89.0  80.0 - 100.0 fL   MCH 27.8 26.0 - 34.0 pg   MCHC 31.3 30.0 - 36.0 g/dL   RDW 18.1 (H) 11.5 - 15.5 %   Platelets 274 150 - 400 K/uL   nRBC 0.0 0.0 - 0.2 %   Neutrophils Relative % 89 %   Neutro Abs 17.7 (H) 1.7 - 7.7 K/uL   Lymphocytes Relative 4 %   Lymphs Abs 0.8 0.7 - 4.0 K/uL   Monocytes Relative 6 %   Monocytes Absolute 1.1 (H) 0 - 1 K/uL   Eosinophils Relative 0 %   Eosinophils Absolute 0.0 0 - 0 K/uL   Basophils Relative 0 %   Basophils Absolute 0.0 0 - 0 K/uL   Immature Granulocytes 1 %   Abs Immature Granulocytes 0.13 (H) 0.00 - 0.07 K/uL  Magnesium     Status: None   Collection Time: 05/15/20 12:55 AM  Result Value Ref Range   Magnesium 2.3 1.7 - 2.4 mg/dL  Phosphorus     Status: None   Collection Time: 05/15/20 12:55 AM  Result Value Ref Range   Phosphorus 3.5 2.5 - 4.6 mg/dL  Procalcitonin     Status: None   Collection Time: 05/15/20 12:55 AM  Result Value Ref Range   Procalcitonin 7.46 ng/mL  Lactic acid, plasma     Status: Abnormal   Collection Time: 05/15/20 12:55 AM  Result Value Ref Range   Lactic Acid, Venous 4.5 (HH) 0.5 - 1.9 mmol/L  Troponin I (High Sensitivity)     Status: Abnormal   Collection Time: 05/15/20 12:55 AM  Result Value Ref Range   Troponin I (High Sensitivity) 55 (H) <18 ng/L  Troponin I (High Sensitivity)     Status: Abnormal   Collection Time: 05/15/20  3:44 AM  Result Value Ref Range   Troponin I (High Sensitivity) 97 (H) <18 ng/L  Comprehensive metabolic panel     Status: Abnormal   Collection Time: 05/15/20  3:44 AM  Result Value Ref Range   Sodium 140 135 - 145 mmol/L   Potassium 3.6 3.5 - 5.1 mmol/L   Chloride 104 98 - 111 mmol/L   CO2 22 22 - 32 mmol/L   Glucose, Bld 296 (H) 70 - 99 mg/dL   BUN 39 (H) 8 - 23 mg/dL   Creatinine, Ser 2.06 (H) 0.61 - 1.24 mg/dL   Calcium 8.3 (L) 8.9 - 10.3 mg/dL   Total Protein 7.1 6.5 - 8.1 g/dL   Albumin 2.3 (L) 3.5 - 5.0 g/dL   AST 14 (L) 15 - 41 U/L   ALT 17 0 - 44 U/L  Alkaline  Phosphatase 47 38 - 126 U/L   Total Bilirubin 0.8 0.3 - 1.2 mg/dL   GFR calc non Af Amer 33 (L) >60 mL/min   GFR calc Af Amer 38 (L) >60 mL/min   Anion gap 14 5 - 15  Lactic acid, plasma     Status: Abnormal   Collection Time: 05/15/20  3:44 AM  Result Value Ref Range   Lactic Acid, Venous 3.8 (HH) 0.5 - 1.9 mmol/L  Troponin I (High Sensitivity)     Status: Abnormal   Collection Time: 05/15/20  3:44 AM  Result Value Ref Range   Troponin I (High Sensitivity) 103 (HH) <18 ng/L  Lactic acid, plasma     Status: Abnormal   Collection Time: 05/15/20  6:23 AM  Result Value Ref Range   Lactic Acid, Venous 4.1 (HH) 0.5 - 1.9 mmol/L  Troponin I (High Sensitivity)     Status: Abnormal   Collection Time: 05/15/20  6:23 AM  Result Value Ref Range   Troponin I (High Sensitivity) 153 (HH) <18 ng/L    Recent Results (from the past 240 hour(s))  Blood Culture (routine x 2)     Status: Abnormal   Collection Time: 05/13/20 12:10 AM   Specimen: BLOOD  Result Value Ref Range Status   Specimen Description   Final    BLOOD RIGHT ANTECUBITAL Performed at [facility], [place] [address], [place], [place] [address]    Special Requests   Final    BOTTLES DRAWN AEROBIC AND ANAEROBIC Blood Culture adequate volume Performed at [place] [address] [place], [place] [address]    Culture  Setup Time   Final    GRAM NEGATIVE RODS IN BOTH AEROBIC AND ANAEROBIC BOTTLES Organism ID to follow CRITICAL RESULT CALLED TO, READ BACK BY AND VERIFIED WITH: Shelda Jakes PHARMD, AT 1704 05/13/20 BY Rush Landmark Performed at [facility], [place] [address], [place], [place] [address]    Culture ESCHERICHIA COLI (A)  Final   Report Status 05/15/2020 FINAL  Final   Organism ID, Bacteria ESCHERICHIA COLI  Final      Susceptibility   Escherichia coli - MIC*    AMPICILLIN >=32 RESISTANT Resistant     CEFAZOLIN >=64 RESISTANT Resistant     CEFEPIME <=0.12 SENSITIVE Sensitive      CEFTAZIDIME <=1 SENSITIVE Sensitive     CEFTRIAXONE 1 SENSITIVE Sensitive     CIPROFLOXACIN <=0.25 SENSITIVE Sensitive     GENTAMICIN <=1 SENSITIVE Sensitive     IMIPENEM <=0.25 SENSITIVE Sensitive     TRIMETH/SULFA <=20 SENSITIVE Sensitive     AMPICILLIN/SULBACTAM >=32 RESISTANT Resistant     PIP/TAZO 8 SENSITIVE Sensitive     * ESCHERICHIA COLI  Blood Culture ID Panel (Reflexed)     Status: Abnormal   Collection Time: 05/13/20 12:10 AM  Result Value Ref Range Status   Enterococcus faecalis NOT DETECTED NOT DETECTED Final   Enterococcus Faecium NOT DETECTED NOT DETECTED Final   Listeria monocytogenes NOT DETECTED NOT DETECTED Final   Staphylococcus species NOT DETECTED NOT DETECTED Final   Staphylococcus aureus (BCID) NOT DETECTED NOT DETECTED Final   Staphylococcus epidermidis NOT DETECTED NOT DETECTED Final   Staphylococcus lugdunensis NOT DETECTED NOT DETECTED Final   Streptococcus species NOT DETECTED NOT DETECTED Final   Streptococcus agalactiae NOT DETECTED NOT DETECTED Final   Streptococcus pneumoniae NOT DETECTED NOT DETECTED Final   Streptococcus pyogenes NOT DETECTED NOT DETECTED Final   A.calcoaceticus-baumannii NOT  DETECTED NOT DETECTED Final   Bacteroides fragilis NOT DETECTED NOT DETECTED Final   Enterobacterales DETECTED (A) NOT DETECTED Final    Comment: Enterobacterales represent a large order of gram negative bacteria, not a single organism. CRITICAL RESULT CALLED TO, READ BACK BY AND VERIFIED WITH: Shelda Jakes PHARMD, AT 1704 05/13/20 BY D. VANHOOK    Enterobacter cloacae complex NOT DETECTED NOT DETECTED Final   Escherichia coli DETECTED (A) NOT DETECTED Final    Comment: CRITICAL RESULT CALLED TO, READ BACK BY AND VERIFIED WITH: Shelda Jakes PHARMD, AT 1704 05/13/20 BY D. VANHOOK    Klebsiella aerogenes NOT DETECTED NOT DETECTED Final   Klebsiella oxytoca NOT DETECTED NOT DETECTED Final   Klebsiella pneumoniae NOT DETECTED NOT DETECTED Final   Proteus species  NOT DETECTED NOT DETECTED Final   Salmonella species NOT DETECTED NOT DETECTED Final   Serratia marcescens NOT DETECTED NOT DETECTED Final   Haemophilus influenzae NOT DETECTED NOT DETECTED Final   Neisseria meningitidis NOT DETECTED NOT DETECTED Final   Pseudomonas aeruginosa NOT DETECTED NOT DETECTED Final   Stenotrophomonas maltophilia NOT DETECTED NOT DETECTED Final   Candida albicans NOT DETECTED NOT DETECTED Final   Candida auris NOT DETECTED NOT DETECTED Final   Candida glabrata NOT DETECTED NOT DETECTED Final   Candida krusei NOT DETECTED NOT DETECTED Final   Candida parapsilosis NOT DETECTED NOT DETECTED Final   Candida tropicalis NOT DETECTED NOT DETECTED Final   Cryptococcus neoformans/gattii NOT DETECTED NOT DETECTED Final   CTX-M ESBL NOT DETECTED NOT DETECTED Final   Carbapenem resistance IMP NOT DETECTED NOT DETECTED Final   Carbapenem resistance KPC NOT DETECTED NOT DETECTED Final   Carbapenem resistance NDM NOT DETECTED NOT DETECTED Final   Carbapenem resist OXA 48 LIKE NOT DETECTED NOT DETECTED Final   Carbapenem resistance VIM NOT DETECTED NOT DETECTED Final    Comment: Performed at [facility], [address] [place], [place] [address]  Blood Culture (routine x 2)     Status: None (Preliminary result)   Collection Time: 05/13/20 12:54 AM   Specimen: BLOOD LEFT FOREARM  Result Value Ref Range Status   Specimen Description   Final    BLOOD LEFT FOREARM Performed at [facility] [address], [place], [place] [address]    Special Requests   Final    BOTTLES DRAWN AEROBIC AND ANAEROBIC Blood Culture results may not be optimal due to an excessive volume of blood received in culture bottles Performed at [facility] [address] [place], [place] [address]    Culture   Final    NO GROWTH 2 DAYS Performed at [facility] [address] [place], [place] [address]    Report Status PENDING  Incomplete  Urine  culture     Status: Abnormal   Collection Time: 05/13/20 12:54 AM   Specimen: In/Out Cath Urine  Result Value Ref Range Status   Specimen Description   Final    IN/OUT CATH URINE Performed at [facility] [address] [place], [place] [address]    Special Requests   Final    NONE Performed at [facility], [place] [address] [place],  [address]    Culture >=100,000 COLONIES/mL ESCHERICHIA COLI (A)  Final   Report Status 05/15/2020 FINAL  Final   Organism ID, Bacteria ESCHERICHIA COLI (A)  Final      Susceptibility   Escherichia coli - MIC*    AMPICILLIN >=32  RESISTANT Resistant     CEFAZOLIN 8 SENSITIVE Sensitive     CEFTRIAXONE 0.5 SENSITIVE Sensitive     CIPROFLOXACIN <=0.25 SENSITIVE Sensitive     GENTAMICIN <=1 SENSITIVE Sensitive     IMIPENEM <=0.25 SENSITIVE Sensitive     NITROFURANTOIN <=16 SENSITIVE Sensitive     TRIMETH/SULFA <=20 SENSITIVE Sensitive     AMPICILLIN/SULBACTAM >=32 RESISTANT Resistant     PIP/TAZO 8 SENSITIVE Sensitive     * >=100,000 COLONIES/mL ESCHERICHIA COLI  Respiratory Panel by RT PCR (Flu A&B, Covid) - Nasopharyngeal Swab     Status: None   Collection Time: 05/13/20  1:26 AM   Specimen: Nasopharyngeal Swab  Result Value Ref Range Status   SARS Coronavirus 2 by RT PCR NEGATIVE NEGATIVE Final    Comment: (NOTE) SARS-CoV-2 target nucleic acids are NOT DETECTED.  The SARS-CoV-2 RNA is generally detectable in upper respiratoy specimens during the acute phase of infection. The lowest concentration of SARS-CoV-2 viral copies this assay can detect is 131 copies/mL. A negative result does not preclude SARS-Cov-2 infection and should not be used as the sole basis for treatment or other patient management decisions. A negative result may occur with  improper specimen collection/handling, submission of specimen other than nasopharyngeal swab, presence of viral mutation(s) within the areas targeted by this  assay, and inadequate number of viral copies (<131 copies/mL). A negative result must be combined with clinical observations, patient history, and epidemiological information. The expected result is Negative.  Fact Sheet for Patients:  PinkCheek.be  Fact Sheet for Healthcare Providers:  GravelBags.it  This test is no t yet approved or cleared by the [place] FDA and  has been authorized for detection and/or diagnosis of SARS-CoV-2 by FDA under an Emergency Use Authorization (EUA). This EUA will remain  in effect (meaning this test can be used) for the duration of the COVID-19 declaration under Section 564(b)(1) of the Act, 21 U.S.C. section 360bbb-3(b)(1), unless the authorization is terminated or revoked sooner.     Influenza A by PCR NEGATIVE NEGATIVE Final   Influenza B by PCR NEGATIVE NEGATIVE Final    Comment: (NOTE) The Xpert Xpress SARS-CoV-2/FLU/RSV assay is intended as an aid in  the diagnosis of influenza from Nasopharyngeal swab specimens and  should not be used as a sole basis for treatment. Nasal washings and  aspirates are unacceptable for Xpert Xpress SARS-CoV-2/FLU/RSV  testing.  Fact Sheet for Patients: PinkCheek.be  Fact Sheet for Healthcare Providers: GravelBags.it  This test is not yet approved or cleared by the [place] FDA and  has been authorized for detection and/or diagnosis of SARS-CoV-2 by  FDA under an Emergency Use Authorization (EUA). This EUA will remain  in effect (meaning this test can be used) for the duration of the  Covid-19 declaration under Section 564(b)(1) of the Act, 21  U.S.C. section 360bbb-3(b)(1), unless the authorization is  terminated or revoked. Performed at [facility], [place] [address], [place], [place] [address]   Aerobic/Anaerobic Culture (surgical/deep wound)     Status: None  (Preliminary result)   Collection Time: 05/13/20  6:20 AM   Specimen: Abdomen; Abscess  Result Value Ref Range Status   Specimen Description ABDOMEN  Final   Special Requests NONE  Final   Gram Stain   Final    ABUNDANT WBC PRESENT, PREDOMINANTLY PMN MODERATE GRAM NEGATIVE RODS    Culture ABUNDANT ESCHERICHIA COLI  Final   Report Status PENDING  Incomplete   Organism ID, Bacteria ESCHERICHIA  COLI  Final      Susceptibility   Escherichia coli - MIC*    AMPICILLIN >=32 RESISTANT Resistant     CEFAZOLIN 8 SENSITIVE Sensitive     CEFEPIME <=0.12 SENSITIVE Sensitive     CEFTAZIDIME <=1 SENSITIVE Sensitive     CEFTRIAXONE <=0.25 SENSITIVE Sensitive     CIPROFLOXACIN <=0.25 SENSITIVE Sensitive     GENTAMICIN <=1 SENSITIVE Sensitive     IMIPENEM <=0.25 SENSITIVE Sensitive     TRIMETH/SULFA <=20 SENSITIVE Sensitive     AMPICILLIN/SULBACTAM >=32 RESISTANT Resistant     PIP/TAZO Value in next row Sensitive      8 SENSITIVEPerformed at [place] [address] [place], [place] [address]    * ABUNDANT ESCHERICHIA COLI  Culture, blood (routine x 2)     Status: None (Preliminary result)   Collection Time: 05/14/20  5:00 AM   Specimen: BLOOD  Result Value Ref Range Status   Specimen Description   Final    BLOOD RIGHT ANTECUBITAL Performed at [facility] [address] [place], [place] [address]    Special Requests   Final    BOTTLES DRAWN AEROBIC AND ANAEROBIC Blood Culture adequate volume Performed at [place] [facility] [address] [place], [place] [address]    Culture   Final    NO GROWTH 1 DAY Performed at [facility], [place] [address], [place], [place] [address]    Report Status PENDING  Incomplete  Culture, blood (routine x 2)     Status: None (Preliminary result)   Collection Time: 05/14/20  5:05 AM   Specimen: BLOOD  Result Value Ref Range Status   Specimen Description   Final    BLOOD RIGHT ANTECUBITAL Performed at  [facility] [address], [place], [place] [address]    Special Requests   Final    BOTTLES DRAWN AEROBIC AND ANAEROBIC Blood Culture adequate volume Performed at [facility] [address], [place], [place] [address]    Culture   Final    NO GROWTH 1 DAY Performed at [facility], [place] [address], [place], [place] [address]    Report Status PENDING  Incomplete  MRSA PCR Screening     Status: Abnormal   Collection Time: 05/14/20  6:32 PM   Specimen: Nasopharyngeal  Result Value Ref Range Status   MRSA by PCR POSITIVE (A) NEGATIVE Final    Comment:        The GeneXpert MRSA Assay (FDA approved for NASAL specimens only), is one component of a comprehensive MRSA colonization surveillance program. It is not intended to diagnose MRSA infection nor to guide or monitor treatment for MRSA infections. RESULT CALLED TO, READ BACK BY AND VERIFIED WITH: Gavin Potters, K. RN @1028  ON 10.5.2021 BY [facility] Performed at [facility], [place] [address] [place], [place] [address]      Radiology Studies: No results found. CT IMAGE GUIDED FLUID DRAIN BY CATHETER  Final Result    CT Renal Stone Study  Final Result    DG Chest Port 1 View  Final Result    CT RENAL STONE STUDY    (Results Pending)    Scheduled Meds:  Chlorhexidine Gluconate Cloth  6 each Topical Daily   enoxaparin (LOVENOX) injection  40 mg Subcutaneous Q24H   mupirocin ointment  1 application Nasal BID   nitroGLYCERIN       predniSONE  5 mg Oral Q  breakfast   sodium chloride flush  5 mL Intracatheter Q8H   PRN Meds: acetaminophen **OR** acetaminophen, hydrALAZINE, labetalol, ondansetron **OR** ondansetron (ZOFRAN) IV, oxyCODONE, polyvinyl alcohol Continuous Infusions:  cefTRIAXone (ROCEPHIN)  IV Stopped (05/14/20 2026)   lactated ringers Stopped (05/15/20 8757)   metronidazole Stopped (05/15/20 1002)      LOS: 2 days  Time spent:  Greater than 50% of the 35 minute visit was spent in counseling/coordination of care for the patient as laid out in the A&P.   Dwyane Dee, MD [facility] 05/15/2020, 12:00 PM  Contact via secure chat.  To contact the attending provider between 7A-7P or the covering provider during after hours 7P-7A, please log into the web site [email or web address] and access using universal [place] password for that web site. If you do not have the password, please call the hospital operator.

## 2020-05-15 NOTE — Progress Notes (Signed)
Urology Inpatient Progress Report    Intv/Subj: Patient had episode of chest pain last night.   He appears more confused this AM.  Thought to have had panic attack.    Principal Problem:   Severe sepsis ([place]) Active Problems:   Atrial fibrillation (HCC)   Essential hypertension   Rheumatoid arthritis ([place])   Type 2 diabetes mellitus with stage 3 chronic kidney disease, without long-term current use of insulin (HCC)   Chronic combined systolic and diastolic CHF (congestive heart failure) (HCC)   Acute renal failure superimposed on stage 3 chronic kidney disease (HCC)   Acute metabolic encephalopathy   Nephrolithiasis   Perinephric abscess   Pyohydronephrosis  Current Facility-Administered Medications  Medication Dose Route Frequency Provider Last Rate Last Admin  . acetaminophen (TYLENOL) tablet 650 mg  650 mg Oral Q6H PRN Etta Quill, DO   650 mg at 05/14/20 2338   Or  . acetaminophen (TYLENOL) suppository 650 mg  650 mg Rectal Q6H PRN Etta Quill, DO      . cefTRIAXone (ROCEPHIN) 2 g in sodium chloride 0.9 % 100 mL IVPB  2 g Intravenous Q24H Dwyane Dee, MD   Stopped at 05/14/20 2026  . Chlorhexidine Gluconate Cloth 2 % PADS 6 each  6 each Topical Daily Dwyane Dee, MD      . enoxaparin (LOVENOX) injection 40 mg  40 mg Subcutaneous Q24H Alcario Drought, Jared M, DO   40 mg at 05/14/20 1324  . hydrALAZINE (APRESOLINE) tablet 25 mg  25 mg Oral Q4H PRN Dwyane Dee, MD      . labetalol (NORMODYNE) injection 10 mg  10 mg Intravenous Q4H PRN Dwyane Dee, MD      . lactated ringers bolus 1,000 mL  1,000 mL Intravenous Once Dwyane Dee, MD      . lactated ringers infusion   Intravenous Continuous Etta Quill, DO 125 mL/hr at 05/15/20 0521 New Bag at 05/15/20 0521  . metroNIDAZOLE (FLAGYL) IVPB 500 mg  500 mg Intravenous Lenise Arena, MD   Stopped at 05/15/20 0127  . nitroGLYCERIN (NITROSTAT) 0.4 MG SL tablet           . ondansetron (ZOFRAN) tablet 4 mg  4 mg  Oral Q6H PRN Etta Quill, DO       Or  . ondansetron [facility]) injection 4 mg  4 mg Intravenous Q6H PRN Etta Quill, DO      . oxyCODONE (Oxy IR/ROXICODONE) immediate release tablet 5 mg  5 mg Oral Q3H PRN Lang Snow, FNP   5 mg at 05/15/20 0515  . polyvinyl alcohol (LIQUIFILM TEARS) 1.4 % ophthalmic solution 1 drop  1 drop Both Eyes PRN Dwyane Dee, MD      . predniSONE (DELTASONE) tablet 5 mg  5 mg Oral Q breakfast Dwyane Dee, MD      . sodium chloride flush (NS) 0.9 % injection 5 mL  5 mL Intracatheter Q8H Corrie Mckusick, DO   5 mL at 05/15/20 0527     Objective: Vital: Vitals:   05/15/20 0300 05/15/20 0400 05/15/20 0500 05/15/20 0515  BP:   (!) 170/91   Pulse: 73 80 79 87  Resp: 17 15 (!) 25 (!) 33  Temp: 98.8 F (37.1 C) 98.6 F (37 C) 98.8 F (37.1 C) 98.8 F (37.1 C)  TempSrc:      SpO2: 97% 98% 99% 98%  Weight:       I/Os: I/O last 3 completed shifts: In: 4010 [I.V.:5257.4; Other:5;  IV Piggyback:682.6] Out: 2725 [Urine:2100; Drains:625]  Physical Exam:  General: Patient is in no apparent distress Lungs: Normal respiratory effort, chest expands symmetrically. GI: soft, RUQ ttp, nondistended R perinephric drain with serosanguinous drainage Foley: draining clear yellow urine   Lab Results: Recent Labs    05/13/20 0700 05/14/20 0415 05/15/20 0055  WBC 27.1* 24.2* 19.7*  HGB 10.1* 10.0* 10.6*  HCT 32.2* 31.3* 33.9*   Recent Labs    05/14/20 0509 05/15/20 0055 05/15/20 0344  NA 139 142 140  K 4.5 3.7 3.6  CL 108 104 104  CO2 23 22 22   GLUCOSE 154* 230* 296*  BUN 30* 37* 39*  CREATININE 1.80* 2.03* 2.06*  CALCIUM 7.6* 8.6* 8.3*   Recent Labs    05/13/20 0054 05/13/20 0700  INR 1.3* 1.4*   No results for input(s): LABURIN in the last 72 hours. Results for orders placed or performed during the hospital encounter of 05/12/20  Blood Culture (routine x 2)     Status: Abnormal (Preliminary result)   Collection Time: 05/13/20 12:10  AM   Specimen: BLOOD  Result Value Ref Range Status   Specimen Description   Final    BLOOD RIGHT ANTECUBITAL Performed at [facility] [address] [place], [place] [address]    Special Requests   Final    BOTTLES DRAWN AEROBIC AND ANAEROBIC Blood Culture adequate volume Performed at [facility] [address] [place], [place] [address]    Culture  Setup Time   Final    GRAM NEGATIVE RODS IN BOTH AEROBIC AND ANAEROBIC BOTTLES Organism ID to follow CRITICAL RESULT CALLED TO, READ BACK BY AND VERIFIED WITH: Shelda Jakes PHARMD, AT 1704 05/13/20 BY Rush [place]mark Performed at [facility], [place] [address], [place], [place] [address]    Culture ESCHERICHIA COLI (A)  Final   Report Status PENDING  Incomplete  Blood Culture ID Panel (Reflexed)     Status: Abnormal   Collection Time: 05/13/20 12:10 AM  Result Value Ref Range Status   Enterococcus faecalis NOT DETECTED NOT DETECTED Final   Enterococcus Faecium NOT DETECTED NOT DETECTED Final   Listeria monocytogenes NOT DETECTED NOT DETECTED Final   Staphylococcus species NOT DETECTED NOT DETECTED Final   Staphylococcus aureus (BCID) NOT DETECTED NOT DETECTED Final   Staphylococcus epidermidis NOT DETECTED NOT DETECTED Final   Staphylococcus lugdunensis NOT DETECTED NOT DETECTED Final   Streptococcus species NOT DETECTED NOT DETECTED Final   Streptococcus agalactiae NOT DETECTED NOT DETECTED Final   Streptococcus pneumoniae NOT DETECTED NOT DETECTED Final   Streptococcus pyogenes NOT DETECTED NOT DETECTED Final   A.calcoaceticus-baumannii NOT DETECTED NOT DETECTED Final   Bacteroides fragilis NOT DETECTED NOT DETECTED Final   Enterobacterales DETECTED (A) NOT DETECTED Final    Comment: Enterobacterales represent a large order of gram negative bacteria, not a single organism. CRITICAL RESULT CALLED TO, READ BACK BY AND VERIFIED WITH: Shelda Jakes PHARMD, AT 1704 05/13/20 BY D. VANHOOK     Enterobacter cloacae complex NOT DETECTED NOT DETECTED Final   Escherichia coli DETECTED (A) NOT DETECTED Final    Comment: CRITICAL RESULT CALLED TO, READ BACK BY AND VERIFIED WITH: Shelda Jakes PHARMD, AT 1704 05/13/20 BY D. VANHOOK    Klebsiella aerogenes NOT DETECTED NOT DETECTED Final   Klebsiella oxytoca NOT DETECTED NOT DETECTED Final   Klebsiella pneumoniae NOT DETECTED NOT DETECTED Final   Proteus species NOT DETECTED NOT DETECTED Final   Salmonella species NOT DETECTED  NOT DETECTED Final   Serratia marcescens NOT DETECTED NOT DETECTED Final   Haemophilus influenzae NOT DETECTED NOT DETECTED Final   Neisseria meningitidis NOT DETECTED NOT DETECTED Final   Pseudomonas aeruginosa NOT DETECTED NOT DETECTED Final   Stenotrophomonas maltophilia NOT DETECTED NOT DETECTED Final   Candida albicans NOT DETECTED NOT DETECTED Final   Candida auris NOT DETECTED NOT DETECTED Final   Candida glabrata NOT DETECTED NOT DETECTED Final   Candida krusei NOT DETECTED NOT DETECTED Final   Candida parapsilosis NOT DETECTED NOT DETECTED Final   Candida tropicalis NOT DETECTED NOT DETECTED Final   Cryptococcus neoformans/gattii NOT DETECTED NOT DETECTED Final   CTX-M ESBL NOT DETECTED NOT DETECTED Final   Carbapenem resistance IMP NOT DETECTED NOT DETECTED Final   Carbapenem resistance KPC NOT DETECTED NOT DETECTED Final   Carbapenem resistance NDM NOT DETECTED NOT DETECTED Final   Carbapenem resist OXA 48 LIKE NOT DETECTED NOT DETECTED Final   Carbapenem resistance VIM NOT DETECTED NOT DETECTED Final    Comment: Performed at [facility], [address] [place], [place] [address]  Blood Culture (routine x 2)     Status: None (Preliminary result)   Collection Time: 05/13/20 12:54 AM   Specimen: BLOOD LEFT FOREARM  Result Value Ref Range Status   Specimen Description   Final    BLOOD LEFT FOREARM Performed at [facility] [address], [place], [place] [address]     Special Requests   Final    BOTTLES DRAWN AEROBIC AND ANAEROBIC Blood Culture results may not be optimal due to an excessive volume of blood received in culture bottles Performed at [facility] [address], [place], [place] [address]    Culture   Final    NO GROWTH 1 DAY Performed at [facility], [place] [address] [place], [place] [address]    Report Status PENDING  Incomplete  Urine culture     Status: Abnormal   Collection Time: 05/13/20 12:54 AM   Specimen: In/Out Cath Urine  Result Value Ref Range Status   Specimen Description   Final    IN/OUT CATH URINE Performed at [facility] [address], [place], [place] [address]    Special Requests   Final    NONE Performed at [facility], [place] [address] [place], [place] [address]    Culture >=100,000 COLONIES/mL ESCHERICHIA COLI (A)  Final   Report Status 05/15/2020 FINAL  Final   Organism ID, Bacteria ESCHERICHIA COLI (A)  Final      Susceptibility   Escherichia coli - MIC*    AMPICILLIN >=32 RESISTANT Resistant     CEFAZOLIN 8 SENSITIVE Sensitive     CEFTRIAXONE 0.5 SENSITIVE Sensitive     CIPROFLOXACIN <=0.25 SENSITIVE Sensitive     GENTAMICIN <=1 SENSITIVE Sensitive     IMIPENEM <=0.25 SENSITIVE Sensitive     NITROFURANTOIN <=16 SENSITIVE Sensitive     TRIMETH/SULFA <=20 SENSITIVE Sensitive     AMPICILLIN/SULBACTAM >=32 RESISTANT Resistant     PIP/TAZO 8 SENSITIVE Sensitive     * >=100,000 COLONIES/mL ESCHERICHIA COLI  Respiratory Panel by RT PCR (Flu A&B, Covid) - Nasopharyngeal Swab     Status: None   Collection Time: 05/13/20  1:26 AM   Specimen: Nasopharyngeal Swab  Result Value Ref Range Status   SARS Coronavirus 2 by RT PCR NEGATIVE NEGATIVE Final    Comment: (NOTE) SARS-CoV-2 target nucleic acids are NOT DETECTED.  The SARS-CoV-2 RNA is generally detectable in upper respiratoy specimens during the acute phase of infection. The  lowest concentration of SARS-CoV-2 viral copies this assay can detect is 131 copies/mL. A negative result does not preclude SARS-Cov-2 infection and should not be used as the sole basis for treatment or other patient management decisions. A negative result may occur with  improper specimen collection/handling, submission of specimen other than nasopharyngeal swab, presence of viral mutation(s) within the areas targeted by this assay, and inadequate number of viral copies (<131 copies/mL). A negative result must be combined with clinical observations, patient history, and epidemiological information. The expected result is Negative.  Fact Sheet for Patients:  PinkCheek.be  Fact Sheet for Healthcare Providers:  GravelBags.it  This test is no t yet approved or cleared by the [place] FDA and  has been authorized for detection and/or diagnosis of SARS-CoV-2 by FDA under an Emergency Use Authorization (EUA). This EUA will remain  in effect (meaning this test can be used) for the duration of the COVID-19 declaration under Section 564(b)(1) of the Act, 21 U.S.C. section 360bbb-3(b)(1), unless the authorization is terminated or revoked sooner.     Influenza A by PCR NEGATIVE NEGATIVE Final   Influenza B by PCR NEGATIVE NEGATIVE Final    Comment: (NOTE) The Xpert Xpress SARS-CoV-2/FLU/RSV assay is intended as an aid in  the diagnosis of influenza from Nasopharyngeal swab specimens and  should not be used as a sole basis for treatment. Nasal washings and  aspirates are unacceptable for Xpert Xpress SARS-CoV-2/FLU/RSV  testing.  Fact Sheet for Patients: PinkCheek.be  Fact Sheet for Healthcare Providers: GravelBags.it  This test is not yet approved or cleared by the [place] FDA and  has been authorized for detection and/or diagnosis of SARS-CoV-2 by  FDA under  an Emergency Use Authorization (EUA). This EUA will remain  in effect (meaning this test can be used) for the duration of the  Covid-19 declaration under Section 564(b)(1) of the Act, 21  U.S.C. section 360bbb-3(b)(1), unless the authorization is  terminated or revoked. Performed at [facility], [place] [address] [place], [place] [address]   Aerobic/Anaerobic Culture (surgical/deep wound)     Status: None (Preliminary result)   Collection Time: 05/13/20  6:20 AM   Specimen: Abdomen; Abscess  Result Value Ref Range Status   Specimen Description   Final    ABDOMEN Performed at [facility] [address] [place], [place] [address]    Special Requests   Final    NONE Performed at [facility], [place] [address], [place], [place] [address]    Gram Stain   Final    ABUNDANT WBC PRESENT, PREDOMINANTLY PMN MODERATE GRAM NEGATIVE RODS Performed at [facility], [place] [address], [place], [place] [address]    Culture ABUNDANT ESCHERICHIA COLI  Final   Report Status PENDING  Incomplete  Culture, blood (routine x 2)     Status: None (Preliminary result)   Collection Time: 05/14/20  5:00 AM   Specimen: BLOOD  Result Value Ref Range Status   Specimen Description   Final    BLOOD RIGHT ANTECUBITAL Performed at [facility] [address] [place], [place] [address]    Special Requests   Final    BOTTLES DRAWN AEROBIC AND ANAEROBIC Blood Culture adequate volume Performed at [facility] [address], [place], [place] [address]    Culture   Final  NO GROWTH < 12 HOURS Performed at [facility] [address], [place], [place] [address]    Report Status PENDING  Incomplete  Culture, blood (routine x 2)     Status: None (Preliminary result)   Collection Time: 05/14/20  5:05 AM   Specimen: BLOOD  Result Value Ref Range Status   Specimen Description   Final    BLOOD RIGHT  ANTECUBITAL Performed at [facility] [address], [place], [place] [address]    Special Requests   Final    BOTTLES DRAWN AEROBIC AND ANAEROBIC Blood Culture adequate volume Performed at [facility] [address], [place], [place] [address]    Culture   Final    NO GROWTH < 12 HOURS Performed at [facility] [address] [place], [place] [address]    Report Status PENDING  Incomplete     Assessment/Plan:64 year old man with multiple medical problems who presented to the ED with altered mental status found to have worsening rightperinephric abscess with air as well as possible liver abscesses and 4 mm left proximal ureteral calculus with perinephric strandingwith clinical signs of sepsis.  1. RIGHT perinephric abscess: Continue R PCN drain, tailor antibiotics to  Sensitivities; consider repeat CT scan to reevaluate abscess   2. LEFT 4 mm proximal ureteral calculus:patient is clinically improving with right perinephric drain, will continue to observe for now; Cr slightly bumped this AM; consider repeat CT scan to reevaluate left hydro/stone  3. Urethral stricture: continue Foley catheter for maximal decompression  Thank you for involving me in this patient's care.Please page with any further questions or concerns.   Jacalyn Lefevre, MD Urology 05/15/2020, 7:48 AM

## 2020-05-15 NOTE — Assessment & Plan Note (Signed)
-  Patient has history of elevated troponins in setting of acute infections. This is considered due to demand. He has recurrent chest pain/shortness of breath which may also be due to ongoing fluid resuscitation from hypotension in setting of sepsis -Trend troponin but likely due to demand ischemia at this time -Repeat EKG as initial performed overnight had excessive artifact/motion

## 2020-05-15 NOTE — Progress Notes (Signed)
Pt with episode of SOB, CP and diaphoresis. EKG performed, labs obtained, vitals indicate BP elevated however other vitals WNL. NP on call notified and to come to bedside.    Post episode pt became emotional and requested to speak with wife. She was called and given an update of the event. Pt given opportunity to speak with wife on the phone and stated that he "thinks I had a panic attack". Wife verbalized that pt has history of anxiety. POC discussed with wife and agreed that staff will call her with update when all tests result. Pt is more settled at this time.

## 2020-05-16 ENCOUNTER — Inpatient Hospital Stay (HOSPITAL_COMMUNITY): Payer: Medicare Other

## 2020-05-16 DIAGNOSIS — N179 Acute kidney failure, unspecified: Secondary | ICD-10-CM

## 2020-05-16 DIAGNOSIS — G9341 Metabolic encephalopathy: Secondary | ICD-10-CM | POA: Diagnosis not present

## 2020-05-16 DIAGNOSIS — R6520 Severe sepsis without septic shock: Secondary | ICD-10-CM

## 2020-05-16 DIAGNOSIS — I313 Pericardial effusion (noninflammatory): Secondary | ICD-10-CM

## 2020-05-16 DIAGNOSIS — A419 Sepsis, unspecified organism: Secondary | ICD-10-CM

## 2020-05-16 DIAGNOSIS — I10 Essential (primary) hypertension: Secondary | ICD-10-CM | POA: Diagnosis not present

## 2020-05-16 DIAGNOSIS — N183 Chronic kidney disease, stage 3 unspecified: Secondary | ICD-10-CM

## 2020-05-16 LAB — BLOOD GAS, ARTERIAL
Acid-Base Excess: 2.6 mmol/L — ABNORMAL HIGH (ref 0.0–2.0)
Bicarbonate: 26.3 mmol/L (ref 20.0–28.0)
Drawn by: 30136
O2 Saturation: 95.8 %
Patient temperature: 38.6
pCO2 arterial: 42.2 mmHg (ref 32.0–48.0)
pH, Arterial: 7.42 (ref 7.350–7.450)
pO2, Arterial: 88 mmHg (ref 83.0–108.0)

## 2020-05-16 LAB — HEMOGLOBIN AND HEMATOCRIT, BLOOD
HCT: 23.9 % — ABNORMAL LOW (ref 39.0–52.0)
Hemoglobin: 7.6 g/dL — ABNORMAL LOW (ref 13.0–17.0)

## 2020-05-16 LAB — GLUCOSE, CAPILLARY
Glucose-Capillary: 136 mg/dL — ABNORMAL HIGH (ref 70–99)
Glucose-Capillary: 147 mg/dL — ABNORMAL HIGH (ref 70–99)
Glucose-Capillary: 148 mg/dL — ABNORMAL HIGH (ref 70–99)
Glucose-Capillary: 168 mg/dL — ABNORMAL HIGH (ref 70–99)

## 2020-05-16 LAB — CBC WITH DIFFERENTIAL/PLATELET
Abs Immature Granulocytes: 0.22 10*3/uL — ABNORMAL HIGH (ref 0.00–0.07)
Basophils Absolute: 0 10*3/uL (ref 0.0–0.1)
Basophils Relative: 0 %
Eosinophils Absolute: 0 10*3/uL (ref 0.0–0.5)
Eosinophils Relative: 0 %
HCT: 23.3 % — ABNORMAL LOW (ref 39.0–52.0)
Hemoglobin: 7.3 g/dL — ABNORMAL LOW (ref 13.0–17.0)
Immature Granulocytes: 1 %
Lymphocytes Relative: 8 %
Lymphs Abs: 1.5 10*3/uL (ref 0.7–4.0)
MCH: 28.2 pg (ref 26.0–34.0)
MCHC: 31.3 g/dL (ref 30.0–36.0)
MCV: 90 fL (ref 80.0–100.0)
Monocytes Absolute: 2.5 10*3/uL — ABNORMAL HIGH (ref 0.1–1.0)
Monocytes Relative: 13 %
Neutro Abs: 15.1 10*3/uL — ABNORMAL HIGH (ref 1.7–7.7)
Neutrophils Relative %: 78 %
Platelets: 244 10*3/uL (ref 150–400)
RBC: 2.59 MIL/uL — ABNORMAL LOW (ref 4.22–5.81)
RDW: 17.9 % — ABNORMAL HIGH (ref 11.5–15.5)
WBC: 19.3 10*3/uL — ABNORMAL HIGH (ref 4.0–10.5)
nRBC: 0 % (ref 0.0–0.2)

## 2020-05-16 LAB — BASIC METABOLIC PANEL
Anion gap: 11 (ref 5–15)
BUN: 38 mg/dL — ABNORMAL HIGH (ref 8–23)
CO2: 25 mmol/L (ref 22–32)
Calcium: 8 mg/dL — ABNORMAL LOW (ref 8.9–10.3)
Chloride: 104 mmol/L (ref 98–111)
Creatinine, Ser: 1.83 mg/dL — ABNORMAL HIGH (ref 0.61–1.24)
GFR calc non Af Amer: 38 mL/min — ABNORMAL LOW (ref >60)
Glucose, Bld: 161 mg/dL — ABNORMAL HIGH (ref 70–99)
Potassium: 3.4 mmol/L — ABNORMAL LOW (ref 3.5–5.1)
Sodium: 140 mmol/L (ref 135–145)

## 2020-05-16 LAB — TROPONIN I (HIGH SENSITIVITY)
Troponin I (High Sensitivity): 282 ng/L (ref <18)
Troponin I (High Sensitivity): 299 ng/L (ref <18)

## 2020-05-16 LAB — PHOSPHORUS: Phosphorus: 2.9 mg/dL (ref 2.5–4.6)

## 2020-05-16 LAB — ECHOCARDIOGRAM COMPLETE
Area-P 1/2: 3.31 cm2
Calc EF: 37 %
S' Lateral: 4.2 cm
Single Plane A2C EF: 38.1 %
Single Plane A4C EF: 33.6 %
Weight: 3746.06 oz

## 2020-05-16 LAB — LACTIC ACID, PLASMA
Lactic Acid, Venous: 2.1 mmol/L (ref 0.5–1.9)
Lactic Acid, Venous: 2.3 mmol/L (ref 0.5–1.9)

## 2020-05-16 LAB — MAGNESIUM: Magnesium: 2.1 mg/dL (ref 1.7–2.4)

## 2020-05-16 MED ORDER — PERFLUTREN LIPID MICROSPHERE
1.0000 mL | INTRAVENOUS | Status: AC | PRN
  Administered 2020-05-16: 2 mL via INTRAVENOUS
  Filled 2020-05-16: qty 10

## 2020-05-16 MED ORDER — LORAZEPAM 2 MG/ML IJ SOLN
1.0000 mg | Freq: Once | INTRAMUSCULAR | Status: AC
  Administered 2020-05-16: 1 mg via INTRAVENOUS
  Filled 2020-05-16: qty 1

## 2020-05-16 MED ORDER — POTASSIUM CHLORIDE 10 MEQ/100ML IV SOLN
10.0000 meq | INTRAVENOUS | Status: AC
  Administered 2020-05-16 (×4): 10 meq via INTRAVENOUS
  Filled 2020-05-16 (×4): qty 100

## 2020-05-16 MED ORDER — CARVEDILOL 12.5 MG PO TABS
12.5000 mg | ORAL_TABLET | Freq: Two times a day (BID) | ORAL | Status: DC
  Administered 2020-05-16 – 2020-05-17 (×2): 12.5 mg via ORAL
  Filled 2020-05-16 (×2): qty 1

## 2020-05-16 MED ORDER — HYDRALAZINE HCL 20 MG/ML IJ SOLN
5.0000 mg | INTRAMUSCULAR | Status: DC | PRN
  Administered 2020-05-16 – 2020-05-22 (×9): 5 mg via INTRAVENOUS
  Filled 2020-05-16 (×9): qty 1

## 2020-05-16 MED ORDER — POTASSIUM CHLORIDE CRYS ER 20 MEQ PO TBCR
30.0000 meq | EXTENDED_RELEASE_TABLET | Freq: Two times a day (BID) | ORAL | Status: DC
  Filled 2020-05-16: qty 1

## 2020-05-16 MED ORDER — IPRATROPIUM-ALBUTEROL 0.5-2.5 (3) MG/3ML IN SOLN
3.0000 mL | RESPIRATORY_TRACT | Status: DC
  Administered 2020-05-16 (×2): 3 mL via RESPIRATORY_TRACT
  Filled 2020-05-16 (×2): qty 3

## 2020-05-16 MED ORDER — POLYETHYLENE GLYCOL 3350 17 G PO PACK
17.0000 g | PACK | Freq: Every day | ORAL | Status: DC | PRN
  Administered 2020-05-16 – 2020-05-20 (×3): 17 g via ORAL
  Filled 2020-05-16 (×3): qty 1

## 2020-05-16 MED ORDER — METOPROLOL TARTRATE 5 MG/5ML IV SOLN: 5.0000 mg | Freq: Four times a day (QID) | INTRAVENOUS | Status: DC

## 2020-05-16 MED ORDER — FUROSEMIDE 10 MG/ML IJ SOLN
40.0000 mg | Freq: Once | INTRAMUSCULAR | Status: AC
  Administered 2020-05-16: 40 mg via INTRAVENOUS
  Filled 2020-05-16: qty 4

## 2020-05-16 MED ORDER — HYDRALAZINE HCL 25 MG PO TABS: 25.0000 mg | ORAL_TABLET | ORAL | Status: DC | PRN

## 2020-05-16 MED ORDER — IPRATROPIUM-ALBUTEROL 0.5-2.5 (3) MG/3ML IN SOLN
3.0000 mL | Freq: Four times a day (QID) | RESPIRATORY_TRACT | Status: DC
  Administered 2020-05-16 – 2020-05-17 (×3): 3 mL via RESPIRATORY_TRACT
  Filled 2020-05-16 (×3): qty 3

## 2020-05-16 NOTE — Progress Notes (Signed)
Urology Inpatient Progress Report    Intv/Subj: Repeat renal CT yesterday showed right retroperitoneal bleed.  He had subsequent CT angio but not active extravasation was seen.  Creatinine is improving.   Patient less alert than yesterday.   Principal Problem:   Severe sepsis ([place]) Active Problems:   Elevated troponin   Atrial fibrillation (HCC)   Essential hypertension   Rheumatoid arthritis ([place])   Type 2 diabetes mellitus with stage 3 chronic kidney disease, without long-term current use of insulin (HCC)   Chronic combined systolic and diastolic CHF (congestive heart failure) (HCC)   Acute renal failure superimposed on stage 3 chronic kidney disease (HCC)   Acute metabolic encephalopathy   Nephrolithiasis   Perinephric abscess   Pyohydronephrosis  Current Facility-Administered Medications  Medication Dose Route Frequency Provider Last Rate Last Admin  . acetaminophen (TYLENOL) tablet 650 mg  650 mg Oral Q6H PRN Etta Quill, DO   650 mg at 05/16/20 5462   Or  . acetaminophen (TYLENOL) suppository 650 mg  650 mg Rectal Q6H PRN Etta Quill, DO      . cefTRIAXone (ROCEPHIN) 2 g in sodium chloride 0.9 % 100 mL IVPB  2 g Intravenous Q24H Dwyane Dee, MD   Stopped at 05/15/20 1748  . Chlorhexidine Gluconate Cloth 2 % PADS 6 each  6 each Topical Daily Dwyane Dee, MD   6 each at 05/15/20 1524  . hydrALAZINE (APRESOLINE) tablet 25 mg  25 mg Oral Q4H PRN Dwyane Dee, MD   25 mg at 05/16/20 0044  . insulin aspart (novoLOG) injection 0-9 Units  0-9 Units Subcutaneous TID AC & HS Dwyane Dee, MD   3 Units at 05/15/20 2300  . labetalol (NORMODYNE) injection 10 mg  10 mg Intravenous Q4H PRN Dwyane Dee, MD   10 mg at 05/15/20 2319  . lactated ringers infusion   Intravenous Continuous Etta Quill, DO 125 mL/hr at 05/16/20 0600 Rate Verify at 05/16/20 0600  . metroNIDAZOLE (FLAGYL) IVPB 500 mg  500 mg Intravenous Lenise Arena, MD   Stopped at 05/16/20 0114  .  mupirocin ointment (BACTROBAN) 2 % 1 application  1 application Nasal BID Dwyane Dee, MD   1 application at 70/35/00 2300  . ondansetron (ZOFRAN) tablet 4 mg  4 mg Oral Q6H PRN Etta Quill, DO       Or  . ondansetron [facility]) injection 4 mg  4 mg Intravenous Q6H PRN Etta Quill, DO      . oxyCODONE (Oxy IR/ROXICODONE) immediate release tablet 5 mg  5 mg Oral Q3H PRN Lang Snow, FNP   5 mg at 05/16/20 0044  . polyvinyl alcohol (LIQUIFILM TEARS) 1.4 % ophthalmic solution 1 drop  1 drop Both Eyes PRN Dwyane Dee, MD      . potassium chloride (KLOR-CON) CR tablet 30 mEq  30 mEq Oral BID Lang Snow, FNP      . predniSONE (DELTASONE) tablet 5 mg  5 mg Oral Q breakfast Dwyane Dee, MD   5 mg at 05/15/20 0825  . sodium chloride flush (NS) 0.9 % injection 5 mL  5 mL Intracatheter Q8H Corrie Mckusick, DO   5 mL at 05/15/20 1333     Objective: Vital: Vitals:   05/16/20 0314 05/16/20 0400 05/16/20 0500 05/16/20 0600  BP:  (!) 175/76 (!) 161/72 (!) 154/84  Pulse: 86 86 86 83  Resp: (!) 26 17 (!) 21 17  Temp: (!) 100.8 F (38.2 C) (!) 100.9 F (38.3  C) (!) 100.6 F (38.1 C) 100 F (37.8 C)  TempSrc:  Bladder    SpO2: 91% 91% 97% 100%  Weight:       I/Os: I/O last 3 completed shifts: In: 8225.9 [I.V.:6850.4; Other:5; IV Piggyback:1370.6] Out: 3400 [Urine:2775; Drains:625]  Physical Exam:  General: Patient grunting in bed, difficult to understand him Lungs: sats 90 with nasal cannula, audible wheezes, working to breath while atttempting to talk GI:  The abdomen is soft, ttp RUQ, R perinephric drain with small amount of ss output Foley: clear yellow urine   Lab Results: Recent Labs    05/15/20 1316 05/15/20 1316 05/15/20 1901 05/15/20 2342 05/16/20 0307  WBC 23.1*  --  21.1*  --  19.3*  HGB 8.0*   < > 8.0* 7.6* 7.3*  HCT 24.8*   < > 25.8* 23.9* 23.3*   < > = values in this interval not displayed.   Recent Labs    05/15/20 0344 05/15/20 1316  05/16/20 0307  NA 140 143 140  K 3.6 4.0 3.4*  CL 104 105 104  CO2 22 22 25   GLUCOSE 296* 259* 161*  BUN 39* 41* 38*  CREATININE 2.06* 1.99* 1.83*  CALCIUM 8.3* 8.4* 8.0*   Recent Labs    05/13/20 0700  INR 1.4*   No results for input(s): LABURIN in the last 72 hours. Results for orders placed or performed during the hospital encounter of 05/12/20  Blood Culture (routine x 2)     Status: Abnormal   Collection Time: 05/13/20 12:10 AM   Specimen: BLOOD  Result Value Ref Range Status   Specimen Description   Final    BLOOD RIGHT ANTECUBITAL Performed at [facility] [address] [place], [place] [address]    Special Requests   Final    BOTTLES DRAWN AEROBIC AND ANAEROBIC Blood Culture adequate volume Performed at [facility] [address], [place], [place] [address]    Culture  Setup Time   Final    GRAM NEGATIVE RODS IN BOTH AEROBIC AND ANAEROBIC BOTTLES Organism ID to follow CRITICAL RESULT CALLED TO, READ BACK BY AND VERIFIED WITH: Shelda Jakes PHARMD, AT 1704 05/13/20 BY Rush Landmark Performed at [facility], [place] [address], [place], [place] [address]    Culture ESCHERICHIA COLI (A)  Final   Report Status 05/15/2020 FINAL  Final   Organism ID, Bacteria ESCHERICHIA COLI  Final      Susceptibility   Escherichia coli - MIC*    AMPICILLIN >=32 RESISTANT Resistant     CEFAZOLIN >=64 RESISTANT Resistant     CEFEPIME <=0.12 SENSITIVE Sensitive     CEFTAZIDIME <=1 SENSITIVE Sensitive     CEFTRIAXONE 1 SENSITIVE Sensitive     CIPROFLOXACIN <=0.25 SENSITIVE Sensitive     GENTAMICIN <=1 SENSITIVE Sensitive     IMIPENEM <=0.25 SENSITIVE Sensitive     TRIMETH/SULFA <=20 SENSITIVE Sensitive     AMPICILLIN/SULBACTAM >=32 RESISTANT Resistant     PIP/TAZO 8 SENSITIVE Sensitive     * ESCHERICHIA COLI  Blood Culture ID Panel (Reflexed)     Status: Abnormal   Collection Time: 05/13/20 12:10 AM  Result Value Ref Range Status    Enterococcus faecalis NOT DETECTED NOT DETECTED Final   Enterococcus Faecium NOT DETECTED NOT DETECTED Final   Listeria monocytogenes NOT DETECTED NOT DETECTED Final   Staphylococcus species NOT DETECTED NOT DETECTED Final   Staphylococcus aureus (BCID) NOT DETECTED NOT DETECTED Final   Staphylococcus epidermidis  NOT DETECTED NOT DETECTED Final   Staphylococcus lugdunensis NOT DETECTED NOT DETECTED Final   Streptococcus species NOT DETECTED NOT DETECTED Final   Streptococcus agalactiae NOT DETECTED NOT DETECTED Final   Streptococcus pneumoniae NOT DETECTED NOT DETECTED Final   Streptococcus pyogenes NOT DETECTED NOT DETECTED Final   A.calcoaceticus-baumannii NOT DETECTED NOT DETECTED Final   Bacteroides fragilis NOT DETECTED NOT DETECTED Final   Enterobacterales DETECTED (A) NOT DETECTED Final    Comment: Enterobacterales represent a large order of gram negative bacteria, not a single organism. CRITICAL RESULT CALLED TO, READ BACK BY AND VERIFIED WITH: Shelda Jakes PHARMD, AT 1704 05/13/20 BY D. VANHOOK    Enterobacter cloacae complex NOT DETECTED NOT DETECTED Final   Escherichia coli DETECTED (A) NOT DETECTED Final    Comment: CRITICAL RESULT CALLED TO, READ BACK BY AND VERIFIED WITH: Shelda Jakes PHARMD, AT 1704 05/13/20 BY D. VANHOOK    Klebsiella aerogenes NOT DETECTED NOT DETECTED Final   Klebsiella oxytoca NOT DETECTED NOT DETECTED Final   Klebsiella pneumoniae NOT DETECTED NOT DETECTED Final   Proteus species NOT DETECTED NOT DETECTED Final   Salmonella species NOT DETECTED NOT DETECTED Final   Serratia marcescens NOT DETECTED NOT DETECTED Final   Haemophilus influenzae NOT DETECTED NOT DETECTED Final   Neisseria meningitidis NOT DETECTED NOT DETECTED Final   Pseudomonas aeruginosa NOT DETECTED NOT DETECTED Final   Stenotrophomonas maltophilia NOT DETECTED NOT DETECTED Final   Candida albicans NOT DETECTED NOT DETECTED Final   Candida auris NOT DETECTED NOT DETECTED Final    Candida glabrata NOT DETECTED NOT DETECTED Final   Candida krusei NOT DETECTED NOT DETECTED Final   Candida parapsilosis NOT DETECTED NOT DETECTED Final   Candida tropicalis NOT DETECTED NOT DETECTED Final   Cryptococcus neoformans/gattii NOT DETECTED NOT DETECTED Final   CTX-M ESBL NOT DETECTED NOT DETECTED Final   Carbapenem resistance IMP NOT DETECTED NOT DETECTED Final   Carbapenem resistance KPC NOT DETECTED NOT DETECTED Final   Carbapenem resistance NDM NOT DETECTED NOT DETECTED Final   Carbapenem resist OXA 48 LIKE NOT DETECTED NOT DETECTED Final   Carbapenem resistance VIM NOT DETECTED NOT DETECTED Final    Comment: Performed at [facility], [name], [place], [place] [address]  Blood Culture (routine x 2)     Status: None (Preliminary result)   Collection Time: 05/13/20 12:54 AM   Specimen: BLOOD LEFT FOREARM  Result Value Ref Range Status   Specimen Description   Final    BLOOD LEFT FOREARM Performed at [facility] [address], [place], [place] [address]    Special Requests   Final    BOTTLES DRAWN AEROBIC AND ANAEROBIC Blood Culture results may not be optimal due to an excessive volume of blood received in culture bottles Performed at [facility] [address], [place], [place] [address]    Culture   Final    NO GROWTH 3 DAYS Performed at [facility], [place] [address] [place], [place] [address]    Report Status PENDING  Incomplete  Urine culture     Status: Abnormal   Collection Time: 05/13/20 12:54 AM   Specimen: In/Out Cath Urine  Result Value Ref Range Status   Specimen Description   Final    IN/OUT CATH URINE Performed at [facility] [address], [place], [place] [address]    Special Requests   Final    NONE Performed at [facility],  [place] [address], [place], [place] [address]    Culture >=100,000 COLONIES/mL ESCHERICHIA COLI (A)  Final   Report Status  05/15/2020 FINAL  Final   Organism ID, Bacteria ESCHERICHIA COLI (A)  Final      Susceptibility   Escherichia coli - MIC*    AMPICILLIN >=32 RESISTANT Resistant     CEFAZOLIN 8 SENSITIVE Sensitive     CEFTRIAXONE 0.5 SENSITIVE Sensitive     CIPROFLOXACIN <=0.25 SENSITIVE Sensitive     GENTAMICIN <=1 SENSITIVE Sensitive     IMIPENEM <=0.25 SENSITIVE Sensitive     NITROFURANTOIN <=16 SENSITIVE Sensitive     TRIMETH/SULFA <=20 SENSITIVE Sensitive     AMPICILLIN/SULBACTAM >=32 RESISTANT Resistant     PIP/TAZO 8 SENSITIVE Sensitive     * >=100,000 COLONIES/mL ESCHERICHIA COLI  Respiratory Panel by RT PCR (Flu A&B, Covid) - Nasopharyngeal Swab     Status: None   Collection Time: 05/13/20  1:26 AM   Specimen: Nasopharyngeal Swab  Result Value Ref Range Status   SARS Coronavirus 2 by RT PCR NEGATIVE NEGATIVE Final    Comment: (NOTE) SARS-CoV-2 target nucleic acids are NOT DETECTED.  The SARS-CoV-2 RNA is generally detectable in upper respiratoy specimens during the acute phase of infection. The lowest concentration of SARS-CoV-2 viral copies this assay can detect is 131 copies/mL. A negative result does not preclude SARS-Cov-2 infection and should not be used as the sole basis for treatment or other patient management decisions. A negative result may occur with  improper specimen collection/handling, submission of specimen other than nasopharyngeal swab, presence of viral mutation(s) within the areas targeted by this assay, and inadequate number of viral copies (<131 copies/mL). A negative result must be combined with clinical observations, patient history, and epidemiological information. The expected result is Negative.  Fact Sheet for Patients:  PinkCheek.be  Fact Sheet for Healthcare Providers:  GravelBags.it  This test is no t yet approved or cleared by the [place] FDA and  has been authorized for detection and/or  diagnosis of SARS-CoV-2 by FDA under an Emergency Use Authorization (EUA). This EUA will remain  in effect (meaning this test can be used) for the duration of the COVID-19 declaration under Section 564(b)(1) of the Act, 21 U.S.C. section 360bbb-3(b)(1), unless the authorization is terminated or revoked sooner.     Influenza A by PCR NEGATIVE NEGATIVE Final   Influenza B by PCR NEGATIVE NEGATIVE Final    Comment: (NOTE) The Xpert Xpress SARS-CoV-2/FLU/RSV assay is intended as an aid in  the diagnosis of influenza from Nasopharyngeal swab specimens and  should not be used as a sole basis for treatment. Nasal washings and  aspirates are unacceptable for Xpert Xpress SARS-CoV-2/FLU/RSV  testing.  Fact Sheet for Patients: PinkCheek.be  Fact Sheet for Healthcare Providers: GravelBags.it  This test is not yet approved or cleared by the [place] FDA and  has been authorized for detection and/or diagnosis of SARS-CoV-2 by  FDA under an Emergency Use Authorization (EUA). This EUA will remain  in effect (meaning this test can be used) for the duration of the  Covid-19 declaration under Section 564(b)(1) of the Act, 21  U.S.C. section 360bbb-3(b)(1), unless the authorization is  terminated or revoked. Performed at [facility], [place] [address] [place], [place] [address]   Aerobic/Anaerobic Culture (surgical/deep wound)     Status: None (Preliminary result)   Collection Time: 05/13/20  6:20 AM   Specimen: Abdomen; Abscess  Result Value Ref Range Status  Specimen Description   Final    ABDOMEN Performed at [facility] [address], [place], [place] [address]    Special Requests   Final    NONE Performed at [facility], [place] [address] [place], [place] [address]    Gram Stain   Final    ABUNDANT WBC PRESENT, PREDOMINANTLY PMN MODERATE GRAM NEGATIVE  RODS Performed at [facility], [place] [address], [place], [place] [address]    Culture   Final    ABUNDANT ESCHERICHIA COLI NO ANAEROBES ISOLATED; CULTURE IN PROGRESS FOR 5 DAYS    Report Status PENDING  Incomplete   Organism ID, Bacteria ESCHERICHIA COLI  Final      Susceptibility   Escherichia coli - MIC*    AMPICILLIN >=32 RESISTANT Resistant     CEFAZOLIN 8 SENSITIVE Sensitive     CEFEPIME <=0.12 SENSITIVE Sensitive     CEFTAZIDIME <=1 SENSITIVE Sensitive     CEFTRIAXONE <=0.25 SENSITIVE Sensitive     CIPROFLOXACIN <=0.25 SENSITIVE Sensitive     GENTAMICIN <=1 SENSITIVE Sensitive     IMIPENEM <=0.25 SENSITIVE Sensitive     TRIMETH/SULFA <=20 SENSITIVE Sensitive     AMPICILLIN/SULBACTAM >=32 RESISTANT Resistant     PIP/TAZO 8 SENSITIVE Sensitive     * ABUNDANT ESCHERICHIA COLI  Culture, blood (routine x 2)     Status: None (Preliminary result)   Collection Time: 05/14/20  5:00 AM   Specimen: BLOOD  Result Value Ref Range Status   Specimen Description   Final    BLOOD RIGHT ANTECUBITAL Performed at [facility] [address] [place], [place] [address]    Special Requests   Final    BOTTLES DRAWN AEROBIC AND ANAEROBIC Blood Culture adequate volume Performed at [facility] [address], [place], [place] [address]    Culture   Final    NO GROWTH 2 DAYS Performed at [facility] [address] [place], [place] [address]    Report Status PENDING  Incomplete  Culture, blood (routine x 2)     Status: None (Preliminary result)   Collection Time: 05/14/20  5:05 AM   Specimen: BLOOD  Result Value Ref Range Status   Specimen Description   Final    BLOOD RIGHT ANTECUBITAL Performed at [facility] [address] [place], [place] [address]    Special Requests   Final    BOTTLES DRAWN AEROBIC AND ANAEROBIC Blood Culture adequate volume Performed at [facility] [address], [place], [place] [address]    Culture   Final    NO GROWTH 2 DAYS Performed at [facility] [address], [place], [place] [address]    Report Status PENDING  Incomplete  MRSA PCR Screening     Status: Abnormal   Collection Time: 05/14/20  6:32 PM   Specimen: Nasopharyngeal  Result Value Ref Range Status   MRSA by PCR POSITIVE (A) NEGATIVE Final    Comment:        The GeneXpert MRSA Assay (FDA approved for NASAL specimens only), is one component of a comprehensive MRSA colonization surveillance program. It is not intended to diagnose MRSA infection nor to guide or monitor treatment for MRSA infections. RESULT CALLED TO, READ BACK BY AND VERIFIED WITH: Gavin Potters, K. RN @1028  ON 10.5.2021 BY [facility] Performed at [facility], [place] [address], [place],  [address]  Studies/Results: CT RENAL STONE STUDY  Result Date: 05/15/2020 CLINICAL DATA:  Sepsis EXAM: CT ABDOMEN AND PELVIS WITHOUT CONTRAST TECHNIQUE: Multidetector CT imaging of the abdomen and pelvis was performed following the standard protocol without IV contrast. COMPARISON:  05/13/2020. FINDINGS: Lower chest: Small bilateral pleural effusions. Bibasilar consolidative opacities with centrilobular ground-glass nodularity of the RIGHT lower lobe. Hepatobiliary: Unchanged wedge-shaped hypodense lesions of the LEFT liver. Unchanged subcapsular lesion of the inferior RIGHT liver, better assessed on prior MRI. Small amount of fluid tracking along the liver, minimally increased. Excreted contrast versus gallstones within the gallbladder. Pancreas: No new focal fat stranding. Spleen: No new focal abnormality. Adrenals/Urinary Tract: Interval placement of a drain in the RIGHT perinephric abscess. There is new high density material in the Perirenal fossa which distorts the contours of the kidney. High density material tracks in the retroperitoneum. There is a small amount of layering seen within this  high density material which could reflect active bleeding. Unchanged hypodense lesion of the LEFT kidney. Adrenals are unremarkable. Bladder is partially decompressed around a Foley catheter. Small amount of intraluminal air consistent with intervention. Stomach/Bowel: No evidence of bowel obstruction. Diverticulosis without evidence of diverticulitis. Status post RIGHT colonic resection. Vascular/Lymphatic: Atherosclerotic calcifications of the aorta. No new suspicious lymph nodes. Reproductive: Coarse calcifications of the prostate. Other: Small fat containing inguinal hernias. Small amount of free fluid. Musculoskeletal: Remote LEFT rib fracture of the posterior twelfth rib. IMPRESSION: 1. Interval placement of a drain in the RIGHT perinephric abscess. There is new high density material in the perirenal fossa which distorts the kidney (Page kidney morphology). Blood products track in the retroperitoneum. There is a small amount of layering blood products seen which could reflect active bleeding. Consider dedicated contrast enhanced CT to evaluate for active extravasation or pseudoaneurysm formation. 2. Small bilateral pleural effusions. 3. Bibasilar consolidative opacities with centrilobular ground-glass nodularity of the RIGHT lower lobe. Findings may reflect aspiration superimposed on atelectasis. Differential considerations include infection. These results were called by telephone at the time of interpretation on 05/15/2020 at 12:37 pm to provider DAVID GIRGUIS , who verbally acknowledged these results. Aortic Atherosclerosis (ICD10-I70.0). Electronically Signed   By: Valentino Saxon MD   On: 05/15/2020 12:47   CT Angio Abd/Pel w/ and/or w/o  Result Date: 05/15/2020 CLINICAL DATA:  64 year old male with history of right perinephric fluid collection status post drainage on 05/13/2020 now with perinephric and retroperitoneal hemorrhage. Evaluate for active extravasation. EXAM: CT ANGIOGRAPHY ABDOMEN AND  PELVIS WITH CONTRAST AND WITHOUT CONTRAST TECHNIQUE: Multidetector CT imaging of the abdomen and pelvis was performed using the standard protocol during bolus administration of intravenous contrast. Multiplanar reconstructed images and MIPs were obtained and reviewed to evaluate the vascular anatomy. CONTRAST:  75mL OMNIPAQUE IOHEXOL 350 MG/ML SOLN COMPARISON:  MRI abdomen from 03/22/2020, CT abdomen pelvis from 05/13/2020, CT-guided drain placement from 05/13/2020, and non contrasted CT abdomen pelvis from the same day. FINDINGS: VASCULAR Aorta: Scattered atherosclerotic calcifications without signficant stenosis, dissection, or aneurysm. Celiac: Patent without evidence of aneurysm, dissection, vasculitis or significant stenosis. SMA: Patent without evidence of aneurysm, dissection, vasculitis or significant stenosis. Renals: Single bilateral renal arteries are patent without evidence of aneurysm, dissection, vasculitis, fibromuscular dysplasia or significant stenosis. IMA: Patent without evidence of aneurysm, dissection, vasculitis or significant stenosis. Inflow: Patent without evidence of aneurysm, dissection, vasculitis or significant stenosis. Proximal Outflow: Bilateral common femoral and visualized portions of the superficial and profunda femoral arteries are patent without evidence of aneurysm, dissection, vasculitis or significant stenosis.  Veins: No obvious venous abnormality within the limitations of this arterial phase study. Review of the MIP images confirms the above findings. NON-VASCULAR Lower chest: Similar appearing small bilateral pleural effusions, right greater than left with associated passive atelectasis. Similar appearing global cardiomegaly with small, incompletely visualized pericardial effusion. Bilateral symmetric gynecomastia. Hepatobiliary: Similar appearing wedge like hypodense regions in the left lower lobe of the liver. Unchanged ill-defined right posterior decreased hepatic  attenuation and wedge like hypodensity in the subcapsular regions of segment 5. The gallbladder is present unremarkable. No intra or extrahepatic biliary ductal dilation. Pancreas: Unremarkable. No pancreatic ductal dilatation or surrounding inflammatory changes. Spleen: Normal in size without focal abnormality. Adrenals/Urinary Tract: The adrenal glands are symmetric in size and enhancement bilaterally. Again seen is a prominent right perinephric hematoma measuring up to 9.4 x 5.3 cm in greatest axial dimension about the lower pole. Similar position of the indwelling pigtail catheter which abuts and potentially is within the right renal cortex. There is a single focus of gas within the anterior and superior aspect of the perinephric hematoma. The hematoma extends into the right retroperitoneum, unchanged from comparison study from approximately 3 hours prior. No evidence of active extravasation. Unchanged simple cysts in the left kidney. No hydronephrosis or nephrolithiasis. Foley catheter is in place within the bladder which is moderately distended. No bladder wall thickening. A doses Aleene Davidson now Stomach/Bowel: Stomach is within normal limits. Appendix appears normal. No evidence of bowel wall thickening, distention, or inflammatory changes. Postsurgical changes after right hemicolectomy. Sigmoid diverticula without surrounding inflammatory changes. Lymphatic: No abdominopelvic lymphadenopathy Reproductive: Prostate is unremarkable. Other: Unchanged appearance of previously visualized right perinephric and retroperitoneal fluid collection with heterogeneous attenuation, no evidence of active extravasation. Musculoskeletal: No acute or significant osseous findings. IMPRESSION: VASCULAR No acute vascular abnormality. No evidence of active extravasation in the right retroperitoneum. Aortic Atherosclerosis (ICD10-I70.0). NON-VASCULAR 1. Similar appearing right perinephric hematoma extending into the right  retroperitoneum without active extravasation, likely related to positioning of the indwelling drainage catheter. 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 2. Similar appearing wedge like hypodensities in the left and right lobes of the liver, indeterminate but could be seen with infarction or abscess formation. Recommend attention on follow-up. 3. Similar appearing small bilateral pleural effusions with associated passive atelectasis. Ruthann Cancer, MD [facility] Electronically Signed   By: Ruthann Cancer MD   On: 05/15/2020 16:31    Assessment/Plan:64 year old man with multiple medical problems who presented to the ED with altered mental status found to have worsening rightperinephric abscess with air as well as possible liver abscesses and 4 mm left proximal ureteral calculus with perinephric strandingwith clinical signs of sepsis.  Patient now with right retroperitoneal hematoma without evidence of active bleed.  Repeat CT shows continued presence of left ureteral calculus.  Overall he appears less interactive than yesterday although WBC and Cr improving.    1. RIGHT perinephric abscess:Continue R PCN drain, tailor antibiotics to  Sensitivities; repeat CT now shows right retroperitoneal bleed without active extravasation.  Serial Hgbs and supportive management.   2. LEFT 4 mm proximal ureteral calculus:patient is clinically improving with right perinephric drain, will continue to observe for now; Cr improving; stone still present on repeat CT yesterday.  No stable enough for intervention in OR and WBC improving.    3. Urethral stricture:continueFoley catheter for maximal decompression  Thank you for involving me in this patient's care.Please page with any further questions or concerns.  Jacalyn Lefevre, MD Urology 05/16/2020, 6:50 AM

## 2020-05-16 NOTE — Progress Notes (Signed)
Referring Physician(s): Dr. Claudia Desanctis  Supervising Physician: Arne Cleveland  Patient Status:  [facility] - In-pt  Chief Complaint: AMS and Sepsis secondary to persistent perinephric abscess. S/p right perinephric abscess drain 05/13/20 by Dr. Earleen Newport  Subjective: Patient with decreased awareness. He is able to answer some questions but is lethargic. He endorses pain but unable to say specifically where.  Recent imaging (CTA 05/15/20) shows a "right perinephric hematoma extending into the right retroperitoneum without active extravasation, likely related to positioning of the indwelling drainage catheter." CT imaging today shows: Interval placement of a drain in the RIGHT perinephric abscess. There is new high density material in the perirenal fossa which distorts the kidney (Page kidney morphology). Blood products track in the retroperitoneum. There is a small amount of layering blood products seen which could reflect active bleeding. Consider dedicated contrast enhanced CT to evaluate for active extravasation or pseudoaneurysm formation.  Allergies: Nsaids and Jardiance [empagliflozin]  Medications: Prior to Admission medications   Medication Sig Start Date End Date Taking? Authorizing Provider  albuterol (PROVENTIL HFA;VENTOLIN HFA) 108 (90 Base) MCG/ACT inhaler Inhale 2 puffs into the lungs every 6 (six) hours as needed for wheezing or shortness of breath. 04/29/18  Yes [name], MD  allopurinol (ZYLOPRIM) 100 MG tablet Take 200 mg by mouth every evening.    Yes [provider]  aspirin (EQ ASPIRIN ADULT LOW DOSE) 81 MG EC tablet TAKE 1 TABLET BY MOUTH ONCE DAILY. SWALLOW WHOLE Patient taking differently: Take 81 mg by mouth daily. TAKE 1 TABLET BY MOUTH ONCE DAILY. SWALLOW WHOLE 02/29/20  Yes [name], MD  atorvastatin (LIPITOR) 40 MG tablet Take 1 tablet (40 mg total) by mouth daily. 09/27/19  Yes Sueanne Margarita, MD  carvedilol (COREG) 12.5 MG tablet Take 1  tablet (12.5 mg total) by mouth 2 (two) times daily with a meal. 09/12/19  Yes Larey Dresser, MD  cyclobenzaprine (FLEXERIL) 5 MG tablet Take 5 mg by mouth every 12 (twelve) hours. 04/05/20  Yes [provider]  diclofenac Sodium (VOLTAREN) 1 % GEL Apply 4 g topically 4 (four) times daily. Patient taking differently: Apply 4 g topically 4 (four) times daily as needed (pain).  10/23/19  Yes Darr, Marguerita Beards, [name]  DULoxetine (CYMBALTA) 20 MG capsule TAKE 1 CAPSULE BY MOUTH ONCE DAILY Patient taking differently: Take 20 mg by mouth daily.  03/27/20  Yes [name], MD  famotidine (PEPCID) 20 MG tablet TAKE 1 TABLET BY MOUTH TWO TIMES DAILY Patient taking differently: Take 20 mg by mouth 2 (two) times daily.  04/18/20  Yes [name], MD  ferrous sulfate 325 (65 FE) MG tablet Take 1 tablet (325 mg total) by mouth 2 (two) times daily with a meal. 01/14/20  Yes Lavina Hamman, MD  furosemide (LASIX) 80 MG tablet Take 80 mg by mouth daily.    Yes [provider]  Insulin Lispro Prot & Lispro (HUMALOG MIX 75/25 KWIKPEN) (75-25) 100 UNIT/ML Kwikpen Inject 100 Units into the skin daily with breakfast. 12/20/19  Yes Renato Shin, MD  isosorbide mononitrate (IMDUR) 30 MG 24 hr tablet Take 30 mg by mouth daily. 01/19/20  Yes [provider]  naloxone [facility]) nasal spray 4 mg/0.1 mL Place in nostril in the event of suspected overdose on Narcotic medication (Oxycodone) 02/16/20  Yes [name], MD  oxyCODONE (OXY IR/ROXICODONE) 5 MG immediate release tablet Take 5 mg by mouth 3 (three) times daily as needed for moderate  pain.  01/24/20  Yes [provider]  polyethylene glycol (MIRALAX / GLYCOLAX) 17 g packet Take 17 g by mouth daily as needed for moderate constipation or severe constipation. 01/14/20  Yes Lavina Hamman, MD  Potassium Chloride ER 20 MEQ TBCR Take 20 mEq by mouth daily.  01/15/20  Yes [provider]  predniSONE (DELTASONE) 5 MG tablet Take 5  mg by mouth daily. 11/23/19  Yes [provider]  pregabalin (LYRICA) 100 MG capsule Take 100 mg by mouth 3 (three) times daily.    Yes [provider]  ramelteon (ROZEREM) 8 MG tablet TAKE 1 TABLET BY MOUTH AT BEDTIME Patient taking differently: Take 8 mg by mouth at bedtime.  04/18/20  Yes [name], MD  Secukinumab (COSENTYX) 150 MG/ML SOSY Inject 150 mg into the skin every 28 (twenty-eight) days. Hold until told to resume by PCP 01/14/20  Yes Lavina Hamman, MD  Tafamidis [facility]) 61 MG CAPS Take 61 mg by mouth daily. 09/08/19  Yes Bensimhon, Shaune Pascal, MD  Accu-Chek FastClix Lancets MISC 1 each by Other route 2 (two) times daily. E11.42 03/27/20   Renato Shin, MD  ACCU-CHEK GUIDE test strip USE 1 STRIP TWICE DAILY TO CHECK BLOOD GLUCOSE 02/13/20   Renato Shin, MD  Insulin Pen Needle 32G X 4 MM MISC 1 each by Does not apply route daily. E11.9 03/06/20   Renato Shin, MD  sacubitril-valsartan (ENTRESTO) 24-26 MG Take 1 tablet by mouth 2 (two) times daily. 05/11/20   Bensimhon, Shaune Pascal, MD     Vital Signs: BP (!) 173/94   Pulse 88   Temp (!) 101.5 F (38.6 C) Comment: room temp cut down, blankets removed  Resp (!) 21   Wt 234 lb 2.1 oz (106.2 kg)   SpO2 95%   BMI 37.79 kg/m   Physical Exam Constitutional:      Appearance: He is ill-appearing.     Comments: Eyes intermittently open   Cardiovascular:     Rate and Rhythm: Normal rate and regular rhythm.  Pulmonary:     Effort: Pulmonary effort is normal.  Abdominal:     General: There is distension.     Comments: Unable to visualize drain insertion site. JP bulb had approximately 20 cc of dark red fluid in bulb.   Neurological:     Mental Status: He is disoriented.     Imaging: DG CHEST PORT 1 VIEW  Result Date: 05/16/2020 CLINICAL DATA:  Hypoxia. EXAM: PORTABLE CHEST 1 VIEW COMPARISON:  05/13/2020 FINDINGS: 0958 hours. Low volume rotated film. The cardio pericardial silhouette is enlarged. There is  pulmonary vascular congestion without overt pulmonary edema. Patchy airspace opacity at the lung bases compatible with atelectasis or infiltrate. No substantial pleural effusion. The visualized bony structures of the thorax show no acute abnormality. Telemetry leads overlie the chest. IMPRESSION: 1. Cardiomegaly with vascular congestion. 2. Bibasilar atelectasis or infiltrate. Electronically Signed   By: Misty Stanley M.D.   On: 05/16/2020 10:15   DG Chest Port 1 View  Result Date: 05/13/2020 CLINICAL DATA:  Fever EXAM: PORTABLE CHEST 1 VIEW COMPARISON:  May 10, 2020 FINDINGS: The heart size and mediastinal contours are unchanged with mild cardiomegaly. There is streaky airspace opacity at the right base as the CT, likely atelectasis scarring. The left lung is clear. No acute osseous. IMPRESSION: Unchanged airspace opacity at the right base, likely atelectasis or scarring Electronically Signed   By: Prudencio Pair M.D.   On: 05/13/2020 00:34  CT RENAL STONE STUDY  Addendum Date: 05/16/2020   ADDENDUM REPORT: 05/16/2020 09:04 ADDENDUM: There is an unchanged 30mm proximal left nephrolithiasis. Electronically Signed   By: Valentino Saxon MD   On: 05/16/2020 09:04   Result Date: 05/16/2020 CLINICAL DATA:  Sepsis EXAM: CT ABDOMEN AND PELVIS WITHOUT CONTRAST TECHNIQUE: Multidetector CT imaging of the abdomen and pelvis was performed following the standard protocol without IV contrast. COMPARISON:  05/13/2020. FINDINGS: Lower chest: Small bilateral pleural effusions. Bibasilar consolidative opacities with centrilobular ground-glass nodularity of the RIGHT lower lobe. Hepatobiliary: Unchanged wedge-shaped hypodense lesions of the LEFT liver. Unchanged subcapsular lesion of the inferior RIGHT liver, better assessed on prior MRI. Small amount of fluid tracking along the liver, minimally increased. Excreted contrast versus gallstones within the gallbladder. Pancreas: No new focal fat stranding. Spleen: No new  focal abnormality. Adrenals/Urinary Tract: Interval placement of a drain in the RIGHT perinephric abscess. There is new high density material in the Perirenal fossa which distorts the contours of the kidney. High density material tracks in the retroperitoneum. There is a small amount of layering seen within this high density material which could reflect active bleeding. Unchanged hypodense lesion of the LEFT kidney. Adrenals are unremarkable. Bladder is partially decompressed around a Foley catheter. Small amount of intraluminal air consistent with intervention. Stomach/Bowel: No evidence of bowel obstruction. Diverticulosis without evidence of diverticulitis. Status post RIGHT colonic resection. Vascular/Lymphatic: Atherosclerotic calcifications of the aorta. No new suspicious lymph nodes. Reproductive: Coarse calcifications of the prostate. Other: Small fat containing inguinal hernias. Small amount of free fluid. Musculoskeletal: Remote LEFT rib fracture of the posterior twelfth rib. IMPRESSION: 1. Interval placement of a drain in the RIGHT perinephric abscess. There is new high density material in the perirenal fossa which distorts the kidney (Page kidney morphology). Blood products track in the retroperitoneum. There is a small amount of layering blood products seen which could reflect active bleeding. Consider dedicated contrast enhanced CT to evaluate for active extravasation or pseudoaneurysm formation. 2. Small bilateral pleural effusions. 3. Bibasilar consolidative opacities with centrilobular ground-glass nodularity of the RIGHT lower lobe. Findings may reflect aspiration superimposed on atelectasis. Differential considerations include infection. These results were called by telephone at the time of interpretation on 05/15/2020 at 12:37 pm to provider DAVID GIRGUIS , who verbally acknowledged these results. Aortic Atherosclerosis (ICD10-I70.0). Electronically Signed: By: Valentino Saxon MD On: 05/15/2020  12:47   CT Renal Stone Study  Result Date: 05/13/2020 CLINICAL DATA:  Fever, question of UTI EXAM: CT ABDOMEN AND PELVIS WITHOUT CONTRAST TECHNIQUE: Multidetector CT imaging of the abdomen and pelvis was performed following the standard protocol without IV contrast. COMPARISON:  MRI of 07/30/2020 FINDINGS: Lower chest: The visualized heart size within normal limits. There is a small pericardial effusion present. Trace bilateral pleural effusions/atelectasis is noted. Hepatobiliary: There are new heterogeneously hypodense lesions seen within the liver parenchyma. The largest in the posterior right liver lobe measures 5.7 cm. Within the anterior left liver lobe there to hypodense lesions the largest measuring 3.4 cm. No evidence of calcified gallstones or biliary ductal dilatation. Pancreas:  Unremarkable.  No surrounding inflammatory changes. Spleen: Normal in size. Although limited due to the lack of intravenous contrast, normal in appearance. Adrenals/Urinary Tract: Both adrenal glands appear normal. Again noted is a complex collection seen posterior to the right kidney measuring 4.5 cm in transverse dimension. There is heterogeneous fluid seen within the central portion and small foci of air now noted. A punctate calcifications seen in the upper pole  of the right kidney. No hydronephrosis. There is mild left-sided perinephric fat stranding changes. Within the proximal left ureter there is a 4 mm calculus present. A 2 cm low-density lesion seen off the lower pole the left kidney. Stomach/Bowel: The stomach, small bowel, and colon are normal in appearance. No inflammatory changes or obstructive findings. Scattered colonic diverticula are noted. A moderate amount of colonic stool is present. Vascular/Lymphatic: There are no enlarged abdominal or pelvic lymph nodes. Scattered aortic atherosclerotic calcifications are seen without aneurysmal dilatation. Reproductive: The prostate is unremarkable. Other: No evidence  of abdominal wall mass or hernia. Musculoskeletal: No acute or significant osseous findings. IMPRESSION: Heterogeneous complex collection posterior to the right kidney now with new foci of air, however appears grossly unchanged in size from the prior exam dating back to August 2021. This likely represents of para renal abscess given the patient's history. New/enlarging hypodense lesions seen throughout the liver parenchyma which could represent hepatic abscesses versus metastatic disease. Mild left perinephric inflammatory changes, likely due to a proximal left 4 mm renal calculus. Small pericardial effusion Electronically Signed   By: Prudencio Pair M.D.   On: 05/13/2020 02:42   CT IMAGE GUIDED FLUID DRAIN BY CATHETER  Result Date: 05/13/2020 INDICATION: 64 year old male with recurrence/persisting abscess in the right perinephric space. He presents with sepsis and has been referred for percutaneous drainage EXAM: CT GUIDED DRAINAGE OF  ABSCESS MEDICATIONS: The patient is currently admitted to the hospital and receiving intravenous antibiotics. The antibiotics were administered within an appropriate time frame prior to the initiation of the procedure. ANESTHESIA/SEDATION: 0.5 mg IV Versed 25 mcg IV Fentanyl Moderate Sedation Time:  12 minutes The patient was continuously monitored during the procedure by the interventional radiology nurse under my direct supervision. COMPLICATIONS: None TECHNIQUE: Informed written consent was obtained from the patient after a thorough discussion of the procedural risks, benefits and alternatives. All questions were addressed. Maximal Sterile Barrier Technique was utilized including caps, mask, sterile gowns, sterile gloves, sterile drape, hand hygiene and skin antiseptic. A timeout was performed prior to the initiation of the procedure. PROCEDURE: Patient was positioned left decubitus on the CT table. Scout CT was acquired. The right flank was prepped with chlorhexidine in a  sterile fashion, and a sterile drape was applied covering the operative field. A sterile gown and sterile gloves were used for the procedure. Local anesthesia was provided with 1% Lidocaine. Once the patient is prepped and draped in the usual sterile fashion, CT guidance was used to advance a trocar needle into the phlegmon of the right perinephric space. Once we confirmed needle tip position with aspiration of purulent fluid, wire was placed and modified Seldinger technique was used to place a 10 [place] drain. Drain position was optimized with serial CT imaging. Drain was then sutured in position attached to bulb suction. Sample was sent to lab for culture. Patient tolerated the procedure well and remained hemodynamically stable throughout. No complications were encountered and no significant blood loss. FINDINGS: Ten French drain into the left perinephric abscess. Uncertain if there is communication between the suspected perihepatic abscess and the perinephric collection. IMPRESSION: Status post CT-guided drainage of right perinephric abscess. Signed, Dulcy Fanny. Dellia Nims, RPVI [facility] Radiology Electronically Signed   By: Corrie Mckusick D.O.   On: 05/13/2020 09:07   CT Angio Abd/Pel w/ and/or w/o  Result Date: 05/15/2020 CLINICAL DATA:  64 year old male with history of right perinephric fluid collection status post drainage on 05/13/2020  now with perinephric and retroperitoneal hemorrhage. Evaluate for active extravasation. EXAM: CT ANGIOGRAPHY ABDOMEN AND PELVIS WITH CONTRAST AND WITHOUT CONTRAST TECHNIQUE: Multidetector CT imaging of the abdomen and pelvis was performed using the standard protocol during bolus administration of intravenous contrast. Multiplanar reconstructed images and MIPs were obtained and reviewed to evaluate the vascular anatomy. CONTRAST:  29mL OMNIPAQUE IOHEXOL 350 MG/ML SOLN COMPARISON:  MRI abdomen from 03/22/2020, CT abdomen pelvis  from 05/13/2020, CT-guided drain placement from 05/13/2020, and non contrasted CT abdomen pelvis from the same day. FINDINGS: VASCULAR Aorta: Scattered atherosclerotic calcifications without signficant stenosis, dissection, or aneurysm. Celiac: Patent without evidence of aneurysm, dissection, vasculitis or significant stenosis. SMA: Patent without evidence of aneurysm, dissection, vasculitis or significant stenosis. Renals: Single bilateral renal arteries are patent without evidence of aneurysm, dissection, vasculitis, fibromuscular dysplasia or significant stenosis. IMA: Patent without evidence of aneurysm, dissection, vasculitis or significant stenosis. Inflow: Patent without evidence of aneurysm, dissection, vasculitis or significant stenosis. Proximal Outflow: Bilateral common femoral and visualized portions of the superficial and profunda femoral arteries are patent without evidence of aneurysm, dissection, vasculitis or significant stenosis. Veins: No obvious venous abnormality within the limitations of this arterial phase study. Review of the MIP images confirms the above findings. NON-VASCULAR Lower chest: Similar appearing small bilateral pleural effusions, right greater than left with associated passive atelectasis. Similar appearing global cardiomegaly with small, incompletely visualized pericardial effusion. Bilateral symmetric gynecomastia. Hepatobiliary: Similar appearing wedge like hypodense regions in the left lower lobe of the liver. Unchanged ill-defined right posterior decreased hepatic attenuation and wedge like hypodensity in the subcapsular regions of segment 5. The gallbladder is present unremarkable. No intra or extrahepatic biliary ductal dilation. Pancreas: Unremarkable. No pancreatic ductal dilatation or surrounding inflammatory changes. Spleen: Normal in size without focal abnormality. Adrenals/Urinary Tract: The adrenal glands are symmetric in size and enhancement bilaterally. Again seen  is a prominent right perinephric hematoma measuring up to 9.4 x 5.3 cm in greatest axial dimension about the lower pole. Similar position of the indwelling pigtail catheter which abuts and potentially is within the right renal cortex. There is a single focus of gas within the anterior and superior aspect of the perinephric hematoma. The hematoma extends into the right retroperitoneum, unchanged from comparison study from approximately 3 hours prior. No evidence of active extravasation. Unchanged simple cysts in the left kidney. No hydronephrosis or nephrolithiasis. Foley catheter is in place within the bladder which is moderately distended. No bladder wall thickening. A doses Aleene Davidson now Stomach/Bowel: Stomach is within normal limits. Appendix appears normal. No evidence of bowel wall thickening, distention, or inflammatory changes. Postsurgical changes after right hemicolectomy. Sigmoid diverticula without surrounding inflammatory changes. Lymphatic: No abdominopelvic lymphadenopathy Reproductive: Prostate is unremarkable. Other: Unchanged appearance of previously visualized right perinephric and retroperitoneal fluid collection with heterogeneous attenuation, no evidence of active extravasation. Musculoskeletal: No acute or significant osseous findings. IMPRESSION: VASCULAR No acute vascular abnormality. No evidence of active extravasation in the right retroperitoneum. Aortic Atherosclerosis (ICD10-I70.0). NON-VASCULAR 1. Similar appearing right perinephric hematoma extending into the right retroperitoneum without active extravasation, likely related to positioning of the indwelling drainage catheter. 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 2. Similar appearing wedge like hypodensities in the left and right lobes of the liver, indeterminate but could be seen with infarction or abscess formation. Recommend attention on follow-up. 3. Similar appearing small bilateral pleural  effusions with associated passive atelectasis. Ruthann Cancer, MD [facility] Radiology Electronically Signed  By: Ruthann Cancer MD   On: 05/15/2020 16:31    Labs:  CBC: Recent Labs    05/15/20 0055 05/15/20 0055 05/15/20 1316 05/15/20 1901 05/15/20 2342 05/16/20 0307  WBC 19.7*  --  23.1* 21.1*  --  19.3*  HGB 10.6*   < > 8.0* 8.0* 7.6* 7.3*  HCT 33.9*   < > 24.8* 25.8* 23.9* 23.3*  PLT 274  --  246 245  --  244   < > = values in this interval not displayed.    COAGS: Recent Labs    12/08/19 2014 12/30/19 1106 05/13/20 0054 05/13/20 0700  INR 1.3* 1.2 1.3* 1.4*  APTT 33  --  31  --     BMP: Recent Labs    05/13/20 0700 05/13/20 0700 05/14/20 0509 05/14/20 0509 05/15/20 0055 05/15/20 0344 05/15/20 1316 05/16/20 0307  NA 139   < > 139   < > 142 140 143 140  K 5.4*   < > 4.5   < > 3.7 3.6 4.0 3.4*  CL 104   < > 108   < > 104 104 105 104  CO2 23   < > 23   < > 22 22 22 25   GLUCOSE 162*   < > 154*   < > 230* 296* 259* 161*  BUN 31*   < > 30*   < > 37* 39* 41* 38*  CALCIUM 7.9*   < > 7.6*   < > 8.6* 8.3* 8.4* 8.0*  CREATININE 2.46*   < > 1.80*   < > 2.03* 2.06* 1.99* 1.83*  GFRNONAA 26*   < > 39*   < > 33* 33* 34* 38*  GFRAA 31*  --  45*  --  39* 38*  --   --    < > = values in this interval not displayed.    LIVER FUNCTION TESTS: Recent Labs    02/16/20 1218 05/13/20 0054 05/13/20 0700 05/15/20 0344  BILITOT 0.2 1.2 1.2 0.8  AST 9 20 16  14*  ALT 14 23 19 17   ALKPHOS 75 59 45 47  PROT 8.1 7.7 6.6 7.1  ALBUMIN 4.0 2.8* 2.3* 2.3*    Assessment and Plan:  Persistent perinephric abscess; s/p perinephric abscess drain 05/13/20: Hgb slightly down today at 7.3. IR received request for evaluation for possible retroperitoneal bleed. No active bleeding identified on current imaging. Dr. Vernard Gambles and Dr. Claudia Desanctis discussed this case and the plan is to continue conservative management and monitor his labs. No IR  procedure planned at this time and the order for IR  Radiologist Eval will be deleted.   Per Dr. Katrinka Blazing recommendation from 05/15/20 (see Claiborne Rigg 10/5//21 note): " In anticipation of perinephric drain tamponading off the hematoma recommend Team terminate flushing the drain at this time".   IR will continue to follow. Other plans per primary teams.   Electronically Signed: Soyla Dryer, AGACNP-BC [phone] 05/16/2020, 12:23 PM   I spent a total of 15 Minutes at the the patient's bedside AND on the patient's hospital floor or unit, greater than 50% of which was counseling/coordinating care for right perinephric abscess drain.

## 2020-05-16 NOTE — Progress Notes (Signed)
PROGRESS NOTE    Marco [name]  JJH:[id number] DOB: 03/06/1956 DOA: 05/12/2020 PCP: [name], MD    Brief Narrative:  64 yo AA male with PMH DMII, HTN, dCHF (EF most recently 50-55% in May, grade 1 DD), RA on biologic therapy.  Patient was admitted in April / May most recently. Had sepsis, hypotension at that time secondary to UTI with a R perinephric abscess (3.9 cm at that time). Drainage of perinephric abscess in late May grew out E.Coli sensitive to cefepime, rocephin.  Assessment & Plan:   Principal Problem:   Severe sepsis ([place]) Active Problems:   Elevated troponin   Atrial fibrillation (HCC)   Essential hypertension   Rheumatoid arthritis (HCC)   Type 2 diabetes mellitus with stage 3 chronic kidney disease, without long-term current use of insulin (HCC)   Chronic combined systolic and diastolic CHF (congestive heart failure) (HCC)   Acute renal failure superimposed on stage 3 chronic kidney disease (HCC)   Acute metabolic encephalopathy   Nephrolithiasis   Perinephric abscess   Pyohydronephrosis  Severe sepsis (HCC) -Fever, tachycardia, tachypnea, leukocytosis, lactic acidosis, encephalopathy, source of infection considered urinary/perinephric abscess -Given vancomycin, cefepime, Flagyl on admission - Blood, urine, and perinephric abscess cultures growing E. Coli - abx de-escalated to CTX/Flagyl - PCT had been downtrending  Perinephric abscess - right sided; seen on CT - s/p drain placement with IR on 10/3 - follow up culture results - see severe sepsis  -Repeat CT on 10/5 due to worsening abdominal pain and distension. Reviewed CT. Findings notable for R sided retroperitoneal bleed with pigtail cath abutting and potentially within the the R renal cortex. Discussed with Urology as well as IR  Acute renal failure superimposed on stage 3 chronic kidney disease ([place]) -Unclear etiology, but possible contribution from outlet obstruction versus severe  sepsis -Patient may still need left-sided percutaneous nephrostomy tube -Continue LR and monitor renal function -Follow-up repeat CT  Elevated troponin -Patient has history of elevated troponins in setting of acute infections. This is considered due to demand. He has recurrent chest pain/shortness of breath which may also be due to ongoing fluid resuscitation from hypotension in setting of sepsis -Trend troponin but likely due to demand ischemia at this time -Cont to monitor for now  Nephrolithiasis -CT reveals a 4 mm left-sided ureteral calculus.  -IVF currently on hold secondary to below -Urology following  Acute metabolic encephalopathy -Typically alert and oriented.  Encephalopathy considered in setting of underlying infection  Hyperkalemia-resolved as of 05/14/2020 - continue tele - no indication for treatment at this time - recheck bmet in AM  Acute on Chronic combined systolic and diastolic CHF (congestive heart failure) ([place]) -This AM, noted to have increased work of breathing -CXR ordered and reviewed. Finding suggestive of vascular pulm congestion -IVF held. Pt given one dose of IV lasix with some improvement -Repeat bmet in AM  Type 2 diabetes mellitus with stage 3 chronic kidney disease, without long-term current use of insulin ([place]) - continue SSI and CBG monitoring as needed  Rheumatoid arthritis ([place]) -On outpatient treatment -Hold secukinumab given active infection -was started on steroids as patient is on chronic prednisone 5 mg daily at home as well  Essential hypertension -oral bp meds held this AM secondary to lethargy -Will resume bp meds when more awake -Currently on scheduled IV beta blocker  Atrial fibrillation ([place]) -Cont on scheduled IV beta blocker -Aspirin on hold in case of need for further interventions  DVT prophylaxis: SCD's Code  Status: Full Family Communication: Pt in room  Status is: Inpatient  Remains inpatient  appropriate because:Hemodynamically unstable, Ongoing diagnostic testing needed not appropriate for outpatient work up, Unsafe d/c plan and IV treatments appropriate due to intensity of illness or inability to take PO   Dispo: The patient is from: Home              Anticipated d/c is to: Unknown at this time. Will need PT eval when more medically stable              Anticipated d/c date is: > 3 days              Patient currently is not medically stable to d/c.   Consultants:   Urology  IR  Procedures:   Right perinephric abscess drain placement, 05/13/20  Antimicrobials: Anti-infectives (From admission, onward)   Start     Dose/Rate Route Frequency Ordered Stop   05/13/20 1800  cefTRIAXone (ROCEPHIN) 2 g in sodium chloride 0.9 % 100 mL IVPB        2 g 200 mL/hr over 30 Minutes Intravenous Every 24 hours 05/13/20 1748     05/13/20 1630  metroNIDAZOLE (FLAGYL) IVPB 500 mg        500 mg 100 mL/hr over 60 Minutes Intravenous Every 8 hours 05/13/20 1618     05/13/20 1300  ceFEPIme (MAXIPIME) 2 g in sodium chloride 0.9 % 100 mL IVPB  Status:  Discontinued        2 g 200 mL/hr over 30 Minutes Intravenous Every 12 hours 05/13/20 0353 05/13/20 1748   05/13/20 0400  ceFEPIme (MAXIPIME) 2 g in sodium chloride 0.9 % 100 mL IVPB  Status:  Discontinued        2 g 200 mL/hr over 30 Minutes Intravenous  Once 05/13/20 0347 05/13/20 0352   05/13/20 0015  vancomycin (VANCOREADY) IVPB 2000 mg/400 mL        2,000 mg 200 mL/hr over 120 Minutes Intravenous  Once 05/13/20 0004 05/13/20 0315   05/13/20 0000  ceFEPIme (MAXIPIME) 2 g in sodium chloride 0.9 % 100 mL IVPB        2 g 200 mL/hr over 30 Minutes Intravenous  Once 05/12/20 2356 05/13/20 0110   05/13/20 0000  metroNIDAZOLE (FLAGYL) IVPB 500 mg        500 mg 100 mL/hr over 60 Minutes Intravenous  Once 05/12/20 2356 05/13/20 0202   05/13/20 0000  vancomycin (VANCOCIN) IVPB 1000 mg/200 mL premix  Status:  Discontinued        1,000 mg 200 mL/hr  over 60 Minutes Intravenous  Once 05/12/20 2356 05/13/20 0003       Subjective: Difficult to assess given decreased alertness  Objective: Vitals:   05/16/20 1400 05/16/20 1436 05/16/20 1500 05/16/20 1604  BP: (!) 147/77  (!) 147/90 (!) 170/83  Pulse: 91  84   Resp: (!) 23  16   Temp: (!) 100.8 F (38.2 C)  (!) 100.6 F (38.1 C)   TempSrc:      SpO2: 97%  95%   Weight:      Height:  5\' 6"  (1.676 m)      Intake/Output Summary (Last 24 hours) at 05/16/2020 1628 Last data filed at 05/16/2020 1441 Gross per 24 hour  Intake 2790.55 ml  Output 2025 ml  Net 765.55 ml   Filed Weights   05/13/20 0400  Weight: 106.2 kg    Examination:  General exam: Appears tired, arousable Respiratory system:  end-expiratory wheezing, increased resp effort Cardiovascular system: S1 & S2 heard, Regular Gastrointestinal system: Abdomen is nondistended, soft and nontender. No organomegaly or masses felt. Normal bowel sounds heard. Central nervous system:asleep, arousable. No focal neurological deficits. Extremities: Symmetric 5 x 5 power. Skin: No rashes, lesions  Psychiatry: Unable to assess given mentation   Data Reviewed: I have personally reviewed following labs and imaging studies  CBC: Recent Labs  Lab 05/14/20 0415 05/14/20 0415 05/15/20 0055 05/15/20 1316 05/15/20 1901 05/15/20 2342 05/16/20 0307  WBC 24.2*  --  19.7* 23.1* 21.1*  --  19.3*  NEUTROABS 21.6*  --  17.7* 19.7* 17.0*  --  15.1*  HGB 10.0*   < > 10.6* 8.0* 8.0* 7.6* 7.3*  HCT 31.3*   < > 33.9* 24.8* 25.8* 23.9* 23.3*  MCV 89.9  --  89.0 87.9 89.9  --  90.0  PLT 286  --  274 246 245  --  244   < > = values in this interval not displayed.   Basic Metabolic Panel: Recent Labs  Lab 05/14/20 0509 05/15/20 0055 05/15/20 0344 05/15/20 1316 05/16/20 0307  NA 139 142 140 143 140  K 4.5 3.7 3.6 4.0 3.4*  CL 108 104 104 105 104  CO2 23 22 22 22 25   GLUCOSE 154* 230* 296* 259* 161*  BUN 30* 37* 39* 41* 38*    CREATININE 1.80* 2.03* 2.06* 1.99* 1.83*  CALCIUM 7.6* 8.6* 8.3* 8.4* 8.0*  MG 2.0 2.3  --   --  2.1  PHOS 3.3 3.5  --   --  2.9   GFR: Estimated Creatinine Clearance: 46 mL/min (A) (by C-G formula based on SCr of 1.83 mg/dL (H)). Liver Function Tests: Recent Labs  Lab 05/13/20 0054 05/13/20 0700 05/15/20 0344  AST 20 16 14*  ALT 23 19 17   ALKPHOS 59 45 47  BILITOT 1.2 1.2 0.8  PROT 7.7 6.6 7.1  ALBUMIN 2.8* 2.3* 2.3*   No results for input(s): LIPASE, AMYLASE in the last 168 hours. No results for input(s): AMMONIA in the last 168 hours. Coagulation Profile: Recent Labs  Lab 05/13/20 0054 05/13/20 0700  INR 1.3* 1.4*   Cardiac Enzymes: No results for input(s): CKTOTAL, CKMB, CKMBINDEX, TROPONINI in the last 168 hours. BNP (last 3 results) No results for input(s): PROBNP in the last 8760 hours. HbA1C: No results for input(s): HGBA1C in the last 72 hours. CBG: Recent Labs  Lab 05/15/20 1247 05/15/20 1624 05/15/20 2114 05/16/20 0750 05/16/20 1157  GLUCAP 249* 243* 205* 136* 147*   Lipid Profile: No results for input(s): CHOL, HDL, LDLCALC, TRIG, CHOLHDL, LDLDIRECT in the last 72 hours. Thyroid Function Tests: No results for input(s): TSH, T4TOTAL, FREET4, T3FREE, THYROIDAB in the last 72 hours. Anemia Panel: No results for input(s): VITAMINB12, FOLATE, FERRITIN, TIBC, IRON, RETICCTPCT in the last 72 hours. Sepsis Labs: Recent Labs  Lab 05/13/20 0340 05/13/20 0700 05/14/20 0509 05/15/20 0055 05/15/20 0344 05/15/20 1316 05/15/20 1901 05/15/20 2342 05/16/20 0307  PROCALCITON  --  15.16 11.37 7.46  --   --   --   --   --   LATICACIDVEN   < >  --   --  4.5*   < > 3.0* 2.9* 2.3* 2.1*   < > = values in this interval not displayed.    Recent Results (from the past 240 hour(s))  Blood Culture (routine x 2)     Status: Abnormal   Collection Time: 05/13/20 12:10 AM   Specimen:  BLOOD  Result Value Ref Range Status   Specimen Description   Final    BLOOD  RIGHT ANTECUBITAL Performed at [facility] [address] [place], [place] [address]    Special Requests   Final    BOTTLES DRAWN AEROBIC AND ANAEROBIC Blood Culture adequate volume Performed at [facility] [address], [place], [place] [address]    Culture  Setup Time   Final    GRAM NEGATIVE RODS IN BOTH AEROBIC AND ANAEROBIC BOTTLES Organism ID to follow CRITICAL RESULT CALLED TO, READ BACK BY AND VERIFIED WITH: Shelda Jakes PHARMD, AT 1704 05/13/20 BY Rush [facility]mark Performed at [facility], [place] [address], [place], [place] [address]    Culture ESCHERICHIA COLI (A)  Final   Report Status 05/15/2020 FINAL  Final   Organism ID, Bacteria ESCHERICHIA COLI  Final      Susceptibility   Escherichia coli - MIC*    AMPICILLIN >=32 RESISTANT Resistant     CEFAZOLIN >=64 RESISTANT Resistant     CEFEPIME <=0.12 SENSITIVE Sensitive     CEFTAZIDIME <=1 SENSITIVE Sensitive     CEFTRIAXONE 1 SENSITIVE Sensitive     CIPROFLOXACIN <=0.25 SENSITIVE Sensitive     GENTAMICIN <=1 SENSITIVE Sensitive     IMIPENEM <=0.25 SENSITIVE Sensitive     TRIMETH/SULFA <=20 SENSITIVE Sensitive     AMPICILLIN/SULBACTAM >=32 RESISTANT Resistant     PIP/TAZO 8 SENSITIVE Sensitive     * ESCHERICHIA COLI  Blood Culture ID Panel (Reflexed)     Status: Abnormal   Collection Time: 05/13/20 12:10 AM  Result Value Ref Range Status   Enterococcus faecalis NOT DETECTED NOT DETECTED Final   Enterococcus Faecium NOT DETECTED NOT DETECTED Final   Listeria monocytogenes NOT DETECTED NOT DETECTED Final   Staphylococcus species NOT DETECTED NOT DETECTED Final   Staphylococcus aureus (BCID) NOT DETECTED NOT DETECTED Final   Staphylococcus epidermidis NOT DETECTED NOT DETECTED Final   Staphylococcus lugdunensis NOT DETECTED NOT DETECTED Final   Streptococcus species NOT DETECTED NOT DETECTED Final   Streptococcus agalactiae NOT DETECTED NOT DETECTED Final   Streptococcus  pneumoniae NOT DETECTED NOT DETECTED Final   Streptococcus pyogenes NOT DETECTED NOT DETECTED Final   A.calcoaceticus-baumannii NOT DETECTED NOT DETECTED Final   Bacteroides fragilis NOT DETECTED NOT DETECTED Final   Enterobacterales DETECTED (A) NOT DETECTED Final    Comment: Enterobacterales represent a large order of gram negative bacteria, not a single organism. CRITICAL RESULT CALLED TO, READ BACK BY AND VERIFIED WITH: Shelda Jakes PHARMD, AT 1704 05/13/20 BY D. VANHOOK    Enterobacter cloacae complex NOT DETECTED NOT DETECTED Final   Escherichia coli DETECTED (A) NOT DETECTED Final    Comment: CRITICAL RESULT CALLED TO, READ BACK BY AND VERIFIED WITH: Shelda Jakes PHARMD, AT 1704 05/13/20 BY D. VANHOOK    Klebsiella aerogenes NOT DETECTED NOT DETECTED Final   Klebsiella oxytoca NOT DETECTED NOT DETECTED Final   Klebsiella pneumoniae NOT DETECTED NOT DETECTED Final   Proteus species NOT DETECTED NOT DETECTED Final   Salmonella species NOT DETECTED NOT DETECTED Final   Serratia marcescens NOT DETECTED NOT DETECTED Final   Haemophilus influenzae NOT DETECTED NOT DETECTED Final   Neisseria meningitidis NOT DETECTED NOT DETECTED Final   Pseudomonas aeruginosa NOT DETECTED NOT DETECTED Final   Stenotrophomonas maltophilia NOT DETECTED NOT DETECTED Final   Candida albicans NOT DETECTED NOT DETECTED Final   Candida auris NOT DETECTED NOT DETECTED Final   Candida glabrata  NOT DETECTED NOT DETECTED Final   Candida krusei NOT DETECTED NOT DETECTED Final   Candida parapsilosis NOT DETECTED NOT DETECTED Final   Candida tropicalis NOT DETECTED NOT DETECTED Final   Cryptococcus neoformans/gattii NOT DETECTED NOT DETECTED Final   CTX-M ESBL NOT DETECTED NOT DETECTED Final   Carbapenem resistance IMP NOT DETECTED NOT DETECTED Final   Carbapenem resistance KPC NOT DETECTED NOT DETECTED Final   Carbapenem resistance NDM NOT DETECTED NOT DETECTED Final   Carbapenem resist OXA 48 LIKE NOT DETECTED NOT  DETECTED Final   Carbapenem resistance VIM NOT DETECTED NOT DETECTED Final    Comment: Performed at [facility], [place] [address], [place], [place] [address]  Blood Culture (routine x 2)     Status: None (Preliminary result)   Collection Time: 05/13/20 12:54 AM   Specimen: BLOOD LEFT FOREARM  Result Value Ref Range Status   Specimen Description   Final    BLOOD LEFT FOREARM Performed at [facility] [address], [place], [place] [address]    Special Requests   Final    BOTTLES DRAWN AEROBIC AND ANAEROBIC Blood Culture results may not be optimal due to an excessive volume of blood received in culture bottles Performed at [facility] [address], [place], [place] [address]    Culture   Final    NO GROWTH 3 DAYS Performed at [facility], [place] [address], [place], [place] [address]    Report Status PENDING  Incomplete  Urine culture     Status: Abnormal   Collection Time: 05/13/20 12:54 AM   Specimen: In/Out Cath Urine  Result Value Ref Range Status   Specimen Description   Final    IN/OUT CATH URINE Performed at [facility] [address] [place], [place] [address]    Special Requests   Final    NONE Performed at [facility], [place] [address], [place], [place] [address]    Culture >=100,000 COLONIES/mL ESCHERICHIA COLI (A)  Final   Report Status 05/15/2020 FINAL  Final   Organism ID, Bacteria ESCHERICHIA COLI (A)  Final      Susceptibility   Escherichia coli - MIC*    AMPICILLIN >=32 RESISTANT Resistant     CEFAZOLIN 8 SENSITIVE Sensitive     CEFTRIAXONE 0.5 SENSITIVE Sensitive     CIPROFLOXACIN <=0.25 SENSITIVE Sensitive     GENTAMICIN <=1 SENSITIVE Sensitive     IMIPENEM <=0.25 SENSITIVE Sensitive     NITROFURANTOIN <=16 SENSITIVE Sensitive     TRIMETH/SULFA <=20 SENSITIVE Sensitive     AMPICILLIN/SULBACTAM >=32 RESISTANT Resistant     PIP/TAZO 8 SENSITIVE Sensitive     *  >=100,000 COLONIES/mL ESCHERICHIA COLI  Respiratory Panel by RT PCR (Flu A&B, Covid) - Nasopharyngeal Swab     Status: None   Collection Time: 05/13/20  1:26 AM   Specimen: Nasopharyngeal Swab  Result Value Ref Range Status   SARS Coronavirus 2 by RT PCR NEGATIVE NEGATIVE Final    Comment: (NOTE) SARS-CoV-2 target nucleic acids are NOT DETECTED.  The SARS-CoV-2 RNA is generally detectable in upper respiratoy specimens during the acute phase of infection. The lowest concentration of SARS-CoV-2 viral copies this assay can detect is 131 copies/mL. A negative result does not preclude SARS-Cov-2 infection and should not be used as the sole basis for treatment or other patient management decisions. A negative result may occur with  improper specimen collection/handling, submission of specimen other than  nasopharyngeal swab, presence of viral mutation(s) within the areas targeted by this assay, and inadequate number of viral copies (<131 copies/mL). A negative result must be combined with clinical observations, patient history, and epidemiological information. The expected result is Negative.  Fact Sheet for Patients:  PinkCheek.be  Fact Sheet for Healthcare Providers:  GravelBags.it  This test is no t yet approved or cleared by the [place] FDA and  has been authorized for detection and/or diagnosis of SARS-CoV-2 by FDA under an Emergency Use Authorization (EUA). This EUA will remain  in effect (meaning this test can be used) for the duration of the COVID-19 declaration under Section 564(b)(1) of the Act, 21 U.S.C. section 360bbb-3(b)(1), unless the authorization is terminated or revoked sooner.     Influenza A by PCR NEGATIVE NEGATIVE Final   Influenza B by PCR NEGATIVE NEGATIVE Final    Comment: (NOTE) The Xpert Xpress SARS-CoV-2/FLU/RSV assay is intended as an aid in  the diagnosis of influenza from Nasopharyngeal  swab specimens and  should not be used as a sole basis for treatment. Nasal washings and  aspirates are unacceptable for Xpert Xpress SARS-CoV-2/FLU/RSV  testing.  Fact Sheet for Patients: PinkCheek.be  Fact Sheet for Healthcare Providers: GravelBags.it  This test is not yet approved or cleared by the [place] FDA and  has been authorized for detection and/or diagnosis of SARS-CoV-2 by  FDA under an Emergency Use Authorization (EUA). This EUA will remain  in effect (meaning this test can be used) for the duration of the  Covid-19 declaration under Section 564(b)(1) of the Act, 21  U.S.C. section 360bbb-3(b)(1), unless the authorization is  terminated or revoked. Performed at [facility], [place] [address], [place], [place] [address]   Aerobic/Anaerobic Culture (surgical/deep wound)     Status: None (Preliminary result)   Collection Time: 05/13/20  6:20 AM   Specimen: Abdomen; Abscess  Result Value Ref Range Status   Specimen Description   Final    ABDOMEN Performed at [facility] [address], [place], [place] [address]    Special Requests   Final    NONE Performed at [facility], [place] [address], [place], [place] [address]    Gram Stain   Final    ABUNDANT WBC PRESENT, PREDOMINANTLY PMN MODERATE GRAM NEGATIVE RODS Performed at [facility], [place] [address] [place], [place] [address]    Culture   Final    ABUNDANT ESCHERICHIA COLI NO ANAEROBES ISOLATED; CULTURE IN PROGRESS FOR 5 DAYS    Report Status PENDING  Incomplete   Organism ID, Bacteria ESCHERICHIA COLI  Final      Susceptibility   Escherichia coli - MIC*    AMPICILLIN >=32 RESISTANT Resistant     CEFAZOLIN 8 SENSITIVE Sensitive     CEFEPIME <=0.12 SENSITIVE Sensitive     CEFTAZIDIME <=1 SENSITIVE Sensitive     CEFTRIAXONE <=0.25 SENSITIVE Sensitive     CIPROFLOXACIN <=0.25 SENSITIVE  Sensitive     GENTAMICIN <=1 SENSITIVE Sensitive     IMIPENEM <=0.25 SENSITIVE Sensitive     TRIMETH/SULFA <=20 SENSITIVE Sensitive     AMPICILLIN/SULBACTAM >=32 RESISTANT Resistant     PIP/TAZO 8 SENSITIVE Sensitive     * ABUNDANT ESCHERICHIA COLI  Culture, blood (routine x 2)     Status: None (Preliminary result)   Collection Time: 05/14/20  5:00 AM   Specimen: BLOOD  Result Value Ref Range Status   Specimen Description   Final  BLOOD RIGHT ANTECUBITAL Performed at [facility] [address], [place], [place] [address]    Special Requests   Final    BOTTLES DRAWN AEROBIC AND ANAEROBIC Blood Culture adequate volume Performed at [facility] [address], [place], [place] [address]    Culture   Final    NO GROWTH 2 DAYS Performed at [facility] [address], [place], [place] [address]    Report Status PENDING  Incomplete  Culture, blood (routine x 2)     Status: None (Preliminary result)   Collection Time: 05/14/20  5:05 AM   Specimen: BLOOD  Result Value Ref Range Status   Specimen Description   Final    BLOOD RIGHT ANTECUBITAL Performed at [facility] [address] [place], [place] [address]    Special Requests   Final    BOTTLES DRAWN AEROBIC AND ANAEROBIC Blood Culture adequate volume Performed at [facility] [address] [place], [place] [address]    Culture   Final    NO GROWTH 2 DAYS Performed at [facility] [address] [place], [place] [address]    Report Status PENDING  Incomplete  MRSA PCR Screening     Status: Abnormal   Collection Time: 05/14/20  6:32 PM   Specimen: Nasopharyngeal  Result Value Ref Range Status   MRSA by PCR POSITIVE (A) NEGATIVE Final    Comment:        The GeneXpert MRSA Assay (FDA approved for NASAL specimens only), is one component of a comprehensive MRSA colonization surveillance program. It is not intended to diagnose  MRSA infection nor to guide or monitor treatment for MRSA infections. RESULT CALLED TO, READ BACK BY AND VERIFIED WITH: Gavin Potters, K. RN @1028  ON 10.5.2021 BY [facility] Performed at [facility], [place] [address], [place],  [address]      Radiology Studies: DG CHEST PORT 1 VIEW  Result Date: 05/16/2020 CLINICAL DATA:  Hypoxia. EXAM: PORTABLE CHEST 1 VIEW COMPARISON:  05/13/2020 FINDINGS: 0958 hours. Low volume rotated film. The cardio pericardial silhouette is enlarged. There is pulmonary vascular congestion without overt pulmonary edema. Patchy airspace opacity at the lung bases compatible with atelectasis or infiltrate. No substantial pleural effusion. The visualized bony structures of the thorax show no acute abnormality. Telemetry leads overlie the chest. IMPRESSION: 1. Cardiomegaly with vascular congestion. 2. Bibasilar atelectasis or infiltrate. Electronically Signed   By: Misty Stanley M.D.   On: 05/16/2020 10:15   ECHOCARDIOGRAM COMPLETE  Result Date: 05/16/2020    ECHOCARDIOGRAM REPORT   Patient Name:   Marco [name] Date of Exam: 05/16/2020 Medical Rec #:  [id number]       Height:       66.0 in Accession #:    [id number]      Weight:       234.1 lb Date of Birth:  May 20, 1956       BSA:          2.138 m Patient Age:    89 years        BP:           175/82 mmHg Patient Gender: M               HR:           96 bpm. Exam Location:  Inpatient Procedure: 2D Echo and Intracardiac Opacification Agent Indications:    I31.3 Pericardial effusion (noninflammatory)  History:        Patient has prior history of Echocardiogram examinations, most                 recent 12/31/2019. Abnormal ECG, Arrythmias:Atrial Fibrillation,                 Signs/Symptoms:Dyspnea, Chest Pain, Altered Mental Status,                 Bacteremia and Fever; Risk Factors:Hypertension, Diabetes and                 Sleep Apnea.  Sonographer:    Roseanna Rainbow RDCS Referring Phys: [place]   Sonographer Comments: Technically difficult study due to poor echo windows and patient is morbidly obese. Image acquisition challenging due to patient body habitus. Patient in high fowler's position. IMPRESSIONS  1. Diffuse hypokinesis with abnormal septal motion . Left ventricular ejection fraction, by estimation, is 25 to 30%. The left ventricle has severely decreased function. The left ventricle demonstrates global hypokinesis. The left ventricular internal cavity size was moderately to severely dilated. There is moderate asymmetric left ventricular hypertrophy of the basal and septal segments. Left ventricular diastolic parameters were normal.  2. Right ventricular systolic function is normal. The right ventricular size is normal.  3. The pericardial effusion is lateral to the left ventricle.  4. The mitral valve is normal in structure. Trivial mitral valve regurgitation. No evidence of mitral stenosis.  5. The aortic valve is normal in structure. Aortic valve regurgitation is not visualized. No aortic stenosis is present.  6. The inferior vena cava is normal in size with greater than 50% respiratory variability, suggesting right atrial pressure of 3 mmHg. FINDINGS  Left Ventricle: Diffuse hypokinesis with abnormal septal motion. Left ventricular ejection fraction, by estimation, is 25 to 30%. The left ventricle has severely decreased function. The left ventricle demonstrates global hypokinesis. Definity contrast agent was given IV to delineate the left ventricular endocardial borders. The left ventricular internal cavity size was moderately to severely dilated. There is moderate asymmetric left ventricular hypertrophy of the basal and septal segments. Left ventricular diastolic parameters were normal. Right Ventricle: The right ventricular size is normal. No increase in right ventricular wall thickness. Right ventricular systolic function is normal. Left Atrium: Left atrial size was normal in size. Right  Atrium: Right atrial size was normal in size. Pericardium: Trivial pericardial effusion is present. The pericardial effusion is lateral to the left ventricle. Mitral Valve: The mitral valve is normal in structure. Trivial mitral valve regurgitation. No evidence of mitral valve stenosis. Tricuspid Valve: The tricuspid valve is normal in structure. Tricuspid valve regurgitation is mild . No evidence of tricuspid stenosis. Aortic Valve: The aortic valve is normal in structure. Aortic valve regurgitation is not visualized. No aortic stenosis is present. Pulmonic Valve: The pulmonic valve was normal in structure. Pulmonic valve regurgitation is not visualized. No evidence of pulmonic stenosis. Aorta: The aortic root is normal in size and structure. Venous: The inferior vena cava is normal in size with greater than 50% respiratory variability, suggesting right atrial pressure of 3 mmHg. IAS/Shunts: The interatrial septum was not well visualized.  LEFT VENTRICLE PLAX 2D LVIDd:         4.90 cm      Diastology LVIDs:         4.20 cm      LV e' medial:    4.90 cm/s LV PW:         1.80  cm      LV E/e' medial:  14.5 LV IVS:        1.60 cm      LV e' lateral:   7.18 cm/s LVOT diam:     2.40 cm      LV E/e' lateral: 9.9 LV SV:         70 LV SV Index:   33 LVOT Area:     4.52 cm  LV Volumes (MOD) LV vol d, MOD A2C: 145.0 ml LV vol d, MOD A4C: 152.0 ml LV vol s, MOD A2C: 89.8 ml LV vol s, MOD A4C: 101.0 ml LV SV MOD A2C:     55.2 ml LV SV MOD A4C:     152.0 ml LV SV MOD BP:      57.1 ml RIGHT VENTRICLE             IVC RV S prime:     17.90 cm/s  IVC diam: 1.50 cm TAPSE (M-mode): 2.5 cm LEFT ATRIUM           Index       RIGHT ATRIUM           Index LA diam:      4.20 cm 1.96 cm/m  RA Area:     11.50 cm LA Vol (A2C): 17.7 ml 8.28 ml/m  RA Volume:   22.70 ml  10.62 ml/m LA Vol (A4C): 40.9 ml 19.13 ml/m  AORTIC VALVE LVOT Vmax:   114.00 cm/s LVOT Vmean:  78.600 cm/s LVOT VTI:    0.154 m  AORTA Ao Root diam: 3.60 cm Ao Asc diam:   3.30 cm MITRAL VALVE MV Area (PHT): 3.31 cm    SHUNTS MV Decel Time: 229 msec    Systemic VTI:  0.15 m MV E velocity: 71.10 cm/s  Systemic Diam: 2.40 cm MV A velocity: 71.35 cm/s MV E/A ratio:  1.00 Jenkins Rouge MD Electronically signed by Jenkins Rouge MD Signature Date/Time: 05/16/2020/12:43:46 PM    Final    CT RENAL STONE STUDY  Addendum Date: 05/16/2020   ADDENDUM REPORT: 05/16/2020 09:04 ADDENDUM: There is an unchanged 82mm proximal left nephrolithiasis. Electronically Signed   By: Valentino Saxon MD   On: 05/16/2020 09:04   Result Date: 05/16/2020 CLINICAL DATA:  Sepsis EXAM: CT ABDOMEN AND PELVIS WITHOUT CONTRAST TECHNIQUE: Multidetector CT imaging of the abdomen and pelvis was performed following the standard protocol without IV contrast. COMPARISON:  05/13/2020. FINDINGS: Lower chest: Small bilateral pleural effusions. Bibasilar consolidative opacities with centrilobular ground-glass nodularity of the RIGHT lower lobe. Hepatobiliary: Unchanged wedge-shaped hypodense lesions of the LEFT liver. Unchanged subcapsular lesion of the inferior RIGHT liver, better assessed on prior MRI. Small amount of fluid tracking along the liver, minimally increased. Excreted contrast versus gallstones within the gallbladder. Pancreas: No new focal fat stranding. Spleen: No new focal abnormality. Adrenals/Urinary Tract: Interval placement of a drain in the RIGHT perinephric abscess. There is new high density material in the Perirenal fossa which distorts the contours of the kidney. High density material tracks in the retroperitoneum. There is a small amount of layering seen within this high density material which could reflect active bleeding. Unchanged hypodense lesion of the LEFT kidney. Adrenals are unremarkable. Bladder is partially decompressed around a Foley catheter. Small amount of intraluminal air consistent with intervention. Stomach/Bowel: No evidence of bowel obstruction. Diverticulosis without evidence of  diverticulitis. Status post RIGHT colonic resection. Vascular/Lymphatic: Atherosclerotic calcifications of the aorta. No new suspicious lymph nodes. Reproductive: Coarse  calcifications of the prostate. Other: Small fat containing inguinal hernias. Small amount of free fluid. Musculoskeletal: Remote LEFT rib fracture of the posterior twelfth rib. IMPRESSION: 1. Interval placement of a drain in the RIGHT perinephric abscess. There is new high density material in the perirenal fossa which distorts the kidney (Page kidney morphology). Blood products track in the retroperitoneum. There is a small amount of layering blood products seen which could reflect active bleeding. Consider dedicated contrast enhanced CT to evaluate for active extravasation or pseudoaneurysm formation. 2. Small bilateral pleural effusions. 3. Bibasilar consolidative opacities with centrilobular ground-glass nodularity of the RIGHT lower lobe. Findings may reflect aspiration superimposed on atelectasis. Differential considerations include infection. These results were called by telephone at the time of interpretation on 05/15/2020 at 12:37 pm to provider DAVID GIRGUIS , who verbally acknowledged these results. Aortic Atherosclerosis (ICD10-I70.0). Electronically Signed: By: Valentino Saxon MD On: 05/15/2020 12:47   CT Angio Abd/Pel w/ and/or w/o  Result Date: 05/15/2020 CLINICAL DATA:  64 year old male with history of right perinephric fluid collection status post drainage on 05/13/2020 now with perinephric and retroperitoneal hemorrhage. Evaluate for active extravasation. EXAM: CT ANGIOGRAPHY ABDOMEN AND PELVIS WITH CONTRAST AND WITHOUT CONTRAST TECHNIQUE: Multidetector CT imaging of the abdomen and pelvis was performed using the standard protocol during bolus administration of intravenous contrast. Multiplanar reconstructed images and MIPs were obtained and reviewed to evaluate the vascular anatomy. CONTRAST:  101mL OMNIPAQUE IOHEXOL 350  MG/ML SOLN COMPARISON:  MRI abdomen from 03/22/2020, CT abdomen pelvis from 05/13/2020, CT-guided drain placement from 05/13/2020, and non contrasted CT abdomen pelvis from the same day. FINDINGS: VASCULAR Aorta: Scattered atherosclerotic calcifications without signficant stenosis, dissection, or aneurysm. Celiac: Patent without evidence of aneurysm, dissection, vasculitis or significant stenosis. SMA: Patent without evidence of aneurysm, dissection, vasculitis or significant stenosis. Renals: Single bilateral renal arteries are patent without evidence of aneurysm, dissection, vasculitis, fibromuscular dysplasia or significant stenosis. IMA: Patent without evidence of aneurysm, dissection, vasculitis or significant stenosis. Inflow: Patent without evidence of aneurysm, dissection, vasculitis or significant stenosis. Proximal Outflow: Bilateral common femoral and visualized portions of the superficial and profunda femoral arteries are patent without evidence of aneurysm, dissection, vasculitis or significant stenosis. Veins: No obvious venous abnormality within the limitations of this arterial phase study. Review of the MIP images confirms the above findings. NON-VASCULAR Lower chest: Similar appearing small bilateral pleural effusions, right greater than left with associated passive atelectasis. Similar appearing global cardiomegaly with small, incompletely visualized pericardial effusion. Bilateral symmetric gynecomastia. Hepatobiliary: Similar appearing wedge like hypodense regions in the left lower lobe of the liver. Unchanged ill-defined right posterior decreased hepatic attenuation and wedge like hypodensity in the subcapsular regions of segment 5. The gallbladder is present unremarkable. No intra or extrahepatic biliary ductal dilation. Pancreas: Unremarkable. No pancreatic ductal dilatation or surrounding inflammatory changes. Spleen: Normal in size without focal abnormality. Adrenals/Urinary Tract: The  adrenal glands are symmetric in size and enhancement bilaterally. Again seen is a prominent right perinephric hematoma measuring up to 9.4 x 5.3 cm in greatest axial dimension about the lower pole. Similar position of the indwelling pigtail catheter which abuts and potentially is within the right renal cortex. There is a single focus of gas within the anterior and superior aspect of the perinephric hematoma. The hematoma extends into the right retroperitoneum, unchanged from comparison study from approximately 3 hours prior. No evidence of active extravasation. Unchanged simple cysts in the left kidney. No hydronephrosis or nephrolithiasis. Foley catheter is in place within the bladder  which is moderately distended. No bladder wall thickening. A doses Aleene Davidson now Stomach/Bowel: Stomach is within normal limits. Appendix appears normal. No evidence of bowel wall thickening, distention, or inflammatory changes. Postsurgical changes after right hemicolectomy. Sigmoid diverticula without surrounding inflammatory changes. Lymphatic: No abdominopelvic lymphadenopathy Reproductive: Prostate is unremarkable. Other: Unchanged appearance of previously visualized right perinephric and retroperitoneal fluid collection with heterogeneous attenuation, no evidence of active extravasation. Musculoskeletal: No acute or significant osseous findings. IMPRESSION: VASCULAR No acute vascular abnormality. No evidence of active extravasation in the right retroperitoneum. Aortic Atherosclerosis (ICD10-I70.0). NON-VASCULAR 1. Similar appearing right perinephric hematoma extending into the right retroperitoneum without active extravasation, likely related to positioning of the indwelling drainage catheter. 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 2. Similar appearing wedge like hypodensities in the left and right lobes of the liver, indeterminate but could be seen with infarction or abscess formation. Recommend  attention on follow-up. 3. Similar appearing small bilateral pleural effusions with associated passive atelectasis. Ruthann Cancer, MD [facility] Electronically Signed   By: Ruthann Cancer MD   On: 05/15/2020 16:31    Scheduled Meds: . Chlorhexidine Gluconate Cloth  6 each Topical Daily  . insulin aspart  0-9 Units Subcutaneous TID AC & HS  . ipratropium-albuterol  3 mL Nebulization Q6H  . metoprolol tartrate  5 mg Intravenous Q6H  . mupirocin ointment  1 application Nasal BID  . predniSONE  5 mg Oral Q breakfast  . sodium chloride flush  5 mL Intracatheter Q8H   Continuous Infusions: . cefTRIAXone (ROCEPHIN)  IV Stopped (05/15/20 1748)  . metronidazole Stopped (05/16/20 0909)     LOS: 3 days   Marylu Lund, MD [facility] Pager On Amion  If 7PM-7AM, please contact night-coverage 05/16/2020, 4:28 PM

## 2020-05-16 NOTE — Progress Notes (Signed)
Held JP drain flush based on note from IR and day RN from report. Mariel Kansky, NP aware.

## 2020-05-16 NOTE — Progress Notes (Addendum)
Pt had increase in heart rate to the 170's for about 10 seconds, pt was anxious before increase in heart rate. Pt is now SR in the 80's. Sharlet Salina, NP made aware. Lab is at bedside drawing scheduled labs, will continue to monitor.

## 2020-05-16 NOTE — Progress Notes (Signed)
  Echocardiogram 2D Echocardiogram has been performed.  Marco [name] 05/16/2020, 11:39 AM

## 2020-05-17 ENCOUNTER — Inpatient Hospital Stay (HOSPITAL_COMMUNITY): Payer: Medicare Other

## 2020-05-17 ENCOUNTER — Ambulatory Visit: Payer: Medicare Other | Admitting: Internal Medicine

## 2020-05-17 DIAGNOSIS — R6520 Severe sepsis without septic shock: Secondary | ICD-10-CM | POA: Diagnosis not present

## 2020-05-17 DIAGNOSIS — A419 Sepsis, unspecified organism: Secondary | ICD-10-CM | POA: Diagnosis not present

## 2020-05-17 DIAGNOSIS — N179 Acute kidney failure, unspecified: Secondary | ICD-10-CM | POA: Diagnosis not present

## 2020-05-17 DIAGNOSIS — G9341 Metabolic encephalopathy: Secondary | ICD-10-CM | POA: Diagnosis not present

## 2020-05-17 LAB — BLOOD GAS, ARTERIAL
Acid-Base Excess: 2.3 mmol/L — ABNORMAL HIGH (ref 0.0–2.0)
Bicarbonate: 25.9 mmol/L (ref 20.0–28.0)
FIO2: 21
O2 Saturation: 91 %
Patient temperature: 99
pCO2 arterial: 37.6 mmHg (ref 32.0–48.0)
pH, Arterial: 7.453 — ABNORMAL HIGH (ref 7.350–7.450)
pO2, Arterial: 70.2 mmHg — ABNORMAL LOW (ref 83.0–108.0)

## 2020-05-17 LAB — CBC WITH DIFFERENTIAL/PLATELET
Abs Immature Granulocytes: 0.22 10*3/uL — ABNORMAL HIGH (ref 0.00–0.07)
Basophils Absolute: 0 10*3/uL (ref 0.0–0.1)
Basophils Relative: 0 %
Eosinophils Absolute: 0.3 10*3/uL (ref 0.0–0.5)
Eosinophils Relative: 1 %
HCT: 23 % — ABNORMAL LOW (ref 39.0–52.0)
Hemoglobin: 7.2 g/dL — ABNORMAL LOW (ref 13.0–17.0)
Immature Granulocytes: 1 %
Lymphocytes Relative: 12 %
Lymphs Abs: 2.1 10*3/uL (ref 0.7–4.0)
MCH: 27.8 pg (ref 26.0–34.0)
MCHC: 31.3 g/dL (ref 30.0–36.0)
MCV: 88.8 fL (ref 80.0–100.0)
Monocytes Absolute: 2.8 10*3/uL — ABNORMAL HIGH (ref 0.1–1.0)
Monocytes Relative: 16 %
Neutro Abs: 12.2 10*3/uL — ABNORMAL HIGH (ref 1.7–7.7)
Neutrophils Relative %: 70 %
Platelets: 262 10*3/uL (ref 150–400)
RBC: 2.59 MIL/uL — ABNORMAL LOW (ref 4.22–5.81)
RDW: 18.2 % — ABNORMAL HIGH (ref 11.5–15.5)
WBC: 17.6 10*3/uL — ABNORMAL HIGH (ref 4.0–10.5)
nRBC: 0.5 % — ABNORMAL HIGH (ref 0.0–0.2)

## 2020-05-17 LAB — GLUCOSE, CAPILLARY
Glucose-Capillary: 142 mg/dL — ABNORMAL HIGH (ref 70–99)
Glucose-Capillary: 269 mg/dL — ABNORMAL HIGH (ref 70–99)
Glucose-Capillary: 281 mg/dL — ABNORMAL HIGH (ref 70–99)
Glucose-Capillary: 196 mg/dL — ABNORMAL HIGH (ref 70–99)

## 2020-05-17 LAB — MAGNESIUM: Magnesium: 2.2 mg/dL (ref 1.7–2.4)

## 2020-05-17 LAB — BASIC METABOLIC PANEL
Anion gap: 12 (ref 5–15)
BUN: 33 mg/dL — ABNORMAL HIGH (ref 8–23)
CO2: 27 mmol/L (ref 22–32)
Calcium: 8.4 mg/dL — ABNORMAL LOW (ref 8.9–10.3)
Chloride: 107 mmol/L (ref 98–111)
Creatinine, Ser: 1.73 mg/dL — ABNORMAL HIGH (ref 0.61–1.24)
GFR calc non Af Amer: 41 mL/min — ABNORMAL LOW (ref >60)
Glucose, Bld: 152 mg/dL — ABNORMAL HIGH (ref 70–99)
Potassium: 3.7 mmol/L (ref 3.5–5.1)
Sodium: 146 mmol/L — ABNORMAL HIGH (ref 135–145)

## 2020-05-17 LAB — PHOSPHORUS: Phosphorus: 2.5 mg/dL (ref 2.5–4.6)

## 2020-05-17 MED ORDER — LEVALBUTEROL HCL 0.63 MG/3ML IN NEBU: 0.6300 mg | INHALATION_SOLUTION | Freq: Three times a day (TID) | RESPIRATORY_TRACT | Status: DC | PRN

## 2020-05-17 MED ORDER — FUROSEMIDE 10 MG/ML IJ SOLN
40.0000 mg | Freq: Every day | INTRAMUSCULAR | Status: DC
  Administered 2020-05-17: 40 mg via INTRAVENOUS
  Filled 2020-05-17: qty 4

## 2020-05-17 MED ORDER — CARVEDILOL 25 MG PO TABS
25.0000 mg | ORAL_TABLET | Freq: Two times a day (BID) | ORAL | Status: DC
  Administered 2020-05-17 – 2020-05-24 (×14): 25 mg via ORAL
  Filled 2020-05-17 (×3): qty 2
  Filled 2020-05-17: qty 1
  Filled 2020-05-17: qty 2
  Filled 2020-05-17: qty 1
  Filled 2020-05-17 (×8): qty 2

## 2020-05-17 MED ORDER — IPRATROPIUM-ALBUTEROL 0.5-2.5 (3) MG/3ML IN SOLN: 3.0000 mL | Freq: Four times a day (QID) | RESPIRATORY_TRACT | Status: DC | PRN

## 2020-05-17 MED ORDER — CHLORPROMAZINE HCL 25 MG PO TABS
25.0000 mg | ORAL_TABLET | Freq: Four times a day (QID) | ORAL | Status: DC | PRN
  Filled 2020-05-17: qty 1

## 2020-05-17 NOTE — TOC Progression Note (Signed)
Transition of Care [facility]) - Progression Note    Patient Details  Name: Marco [name] MRN: [id number] Date of Birth: July 10, 1956  [facility] CM/SW Contact  Leeroy Cha, RN Phone Number: 05/17/2020, 8:39 AM  Clinical Narrative:    63 yo AA male with PMH DMII, HTN, dCHF (EF most recently 50-55% in May, grade 1 DD), RA on biologic therapy.  Patient was admitted in April / May most recently. Had sepsis, hypotension at that time secondary to UTI with a R perinephric abscess (3.9 cm at that time). Drainage of perinephric abscess in late May grew out E.Coli sensitive to cefepime, rocephin. temp-101.5, room air, iv rocephin, iv flagyl, na 146, bun 33./creat 1.73-hxo f ckf, wbc 17.6, hgb 7.2 Perirectal abscess and drainage Following for possible hhc needs and progression.   Expected Discharge Plan: Home/Self Care Barriers to Discharge: Continued Medical Work up  Expected Discharge Plan and Services Expected Discharge Plan: Home/Self Care   Discharge Planning Services: CM Consult   Living arrangements for the past 2 months: Single Family Home                                       Social Determinants of Health (SDOH) Interventions    Readmission Risk Interventions Readmission Risk Prevention Plan 12/28/2019  Transportation Screening Complete  PCP or Specialist Appt within 3-5 Days Complete  HRI or [place] Complete  Social Work Consult for [place] Planning/Counseling Complete  Palliative Care Screening Not Applicable  Medication Review [employer] photographer) Complete  Some recent data might be hidden

## 2020-05-17 NOTE — Progress Notes (Signed)
PROGRESS NOTE    Marco [name]  QAS:[id number] DOB: [birth year] DOA: 05/12/2020 PCP: [name], MD    Brief Narrative:  63 yo AA male with PMH DMII, HTN, dCHF (EF most recently 50-55% in May, grade 1 DD), RA on biologic therapy.  Patient was admitted in April / May most recently. Had sepsis, hypotension at that time secondary to UTI with a R perinephric abscess (3.9 cm at that time). Drainage of perinephric abscess in late May grew out E.Coli sensitive to cefepime, rocephin.  Assessment & Plan:   Principal Problem:   Severe sepsis ([place]) Active Problems:   Elevated troponin   Atrial fibrillation (HCC)   Essential hypertension   Rheumatoid arthritis (HCC)   Type 2 diabetes mellitus with stage 3 chronic kidney disease, without long-term current use of insulin (HCC)   Chronic combined systolic and diastolic CHF (congestive heart failure) (HCC)   Acute renal failure superimposed on stage 3 chronic kidney disease (HCC)   Acute metabolic encephalopathy   Nephrolithiasis   Perinephric abscess   Pyohydronephrosis  Severe sepsis (HCC) -Presenting fever, tachycardia, tachypnea, leukocytosis, lactic acidosis, encephalopathy, source of infection considered urinary/perinephric abscess -Pt was given vancomycin, cefepime, Flagyl on admission - Blood, urine, and perinephric abscess cultures growing E. Coli - abx was later de-escalated to CTX/Flagyl. Discussed with pharmacy. Will further de-escalate to rocephin monotherapy - PCT had been downtrending  Perinephric abscess - right sided; seen on CT - s/p drain placement with IR on 10/3 - follow up culture results - see severe sepsis  -Repeat CT on 10/5 due to worsening abdominal pain and distension. Reviewed CT. Findings notable for R sided retroperitoneal bleed with pigtail cath abutting and potentially within the the R renal cortex. Appreciate input by Urology and IR. - Continue drain for now, plan f/u imaging once drain output  becomes minimal over 3-4 day period or if clinical status worsens  Acute renal failure superimposed on stage 3 chronic kidney disease ([place]) -Unclear etiology, but possible contribution from outlet obstruction versus severe sepsis -Patient may still need left-sided percutaneous nephrostomy tube -IVF held secondary to below acute chf exacerbation - Renal function improving with diuresis - Repeat bmet in AM  Elevated troponin -Patient has history of elevated troponins in setting of acute infections. This is considered due to demand. He has recurrent chest pain/shortness of breath which may also be due to ongoing fluid resuscitation from hypotension in setting of sepsis -Trend troponin but likely due to demand ischemia at this time -Cont to monitor for now  Nephrolithiasis -CT reveals a 4 mm left-sided ureteral calculus.  -IVF currently on hold secondary to below -Urology is following  Acute metabolic encephalopathy -Typically alert and oriented.  Encephalopathy considered in setting of underlying infection  Hyperkalemia-resolved as of 05/14/2020 - continue tele - Recheck bmet in AM  Acute on Chronic combined systolic and diastolic CHF (congestive heart failure) ([place]) -Recent CXR ordered and reviewed. Finding suggestive of vascular pulm congestion -IVF held. Pt started on scheduled IV lasix with good results -2d echo reviewed, findings of EF of 25-30% -Chart reviewed. Pt is well known to heart failure service as outpatient and noted to have variable EF heart failure with EF of 25% in 2018, more recently 50% several months ago. Pt is off entresto secondary to hypotension and off hydralazine. Pt is off Jardiance secondary to n/v.  -Work up was initially suggestive of TTR amyloidosis although later genetic testing was neg. Repeat PYP on 1/21 was read as equivocal  although Cardiology felt it to be negative -Discussed with Cardiology who agrees with diuresis as tolerated, agrees with  titrating coreg as BP tolerates  Type 2 diabetes mellitus with stage 3 chronic kidney disease, without long-term current use of insulin (HCC) - continue SSI and CBG monitoring as needed -glycemic trends overall stable, although rare elevated glucose in the 200's noted this AM  Rheumatoid arthritis ([place]) -On outpatient treatment -Continuing to hold secukinumab given active infection -was started on steroids as patient is on chronic prednisone 5 mg daily at home as well  Essential hypertension -continue coreg per above -Will increase dose to 25mg  bid as tolerated  Atrial fibrillation (HCC) -Cont on beta blocker per above -Aspirin on hold in case of need for further interventions  DVT prophylaxis: SCD's Code Status: Full Family Communication: Pt in room  Status is: Inpatient  Remains inpatient appropriate because:Hemodynamically unstable, Ongoing diagnostic testing needed not appropriate for outpatient work up, Unsafe d/c plan and IV treatments appropriate due to intensity of illness or inability to take PO   Dispo: The patient is from: Home              Anticipated d/c is to: Unknown at this time. Will need PT eval when more medically stable              Anticipated d/c date is: > 3 days              Patient currently is not medically stable to d/c.   Consultants:   Urology  IR  Procedures:   Right perinephric abscess drain placement, 05/13/20  Antimicrobials: Anti-infectives (From admission, onward)   Start     Dose/Rate Route Frequency Ordered Stop   05/13/20 1800  cefTRIAXone (ROCEPHIN) 2 g in sodium chloride 0.9 % 100 mL IVPB        2 g 200 mL/hr over 30 Minutes Intravenous Every 24 hours 05/13/20 1748     05/13/20 1630  metroNIDAZOLE (FLAGYL) IVPB 500 mg        500 mg 100 mL/hr over 60 Minutes Intravenous Every 8 hours 05/13/20 1618     05/13/20 1300  ceFEPIme (MAXIPIME) 2 g in sodium chloride 0.9 % 100 mL IVPB  Status:  Discontinued        2 g 200 mL/hr  over 30 Minutes Intravenous Every 12 hours 05/13/20 0353 05/13/20 1748   05/13/20 0400  ceFEPIme (MAXIPIME) 2 g in sodium chloride 0.9 % 100 mL IVPB  Status:  Discontinued        2 g 200 mL/hr over 30 Minutes Intravenous  Once 05/13/20 0347 05/13/20 0352   05/13/20 0015  vancomycin (VANCOREADY) IVPB 2000 mg/400 mL        2,000 mg 200 mL/hr over 120 Minutes Intravenous  Once 05/13/20 0004 05/13/20 0315   05/13/20 0000  ceFEPIme (MAXIPIME) 2 g in sodium chloride 0.9 % 100 mL IVPB        2 g 200 mL/hr over 30 Minutes Intravenous  Once 05/12/20 2356 05/13/20 0110   05/13/20 0000  metroNIDAZOLE (FLAGYL) IVPB 500 mg        500 mg 100 mL/hr over 60 Minutes Intravenous  Once 05/12/20 2356 05/13/20 0202   05/13/20 0000  vancomycin (VANCOCIN) IVPB 1000 mg/200 mL premix  Status:  Discontinued        1,000 mg 200 mL/hr over 60 Minutes Intravenous  Once 05/12/20 2356 05/13/20 0003      Subjective: More awake and alert, without complaints.  Reports feeling better but also has back pains  Objective: Vitals:   05/17/20 1000 05/17/20 1100 05/17/20 1200 05/17/20 1300  BP: (!) 162/125 (!) 146/83 123/84 (!) 145/70  Pulse: 88 86 87 85  Resp: 17 14 20 12   Temp: 99.5 F (37.5 C) 99.9 F (37.7 C) 99.5 F (37.5 C) 99.5 F (37.5 C)  TempSrc:      SpO2: (!) 87% 99% 92% 97%  Weight:      Height:        Intake/Output Summary (Last 24 hours) at 05/17/2020 1336 Last data filed at 05/16/2020 1827 Gross per 24 hour  Intake 492.16 ml  Output 706 ml  Net -213.84 ml   Filed Weights   05/13/20 0400  Weight: 106.2 kg    Examination: General exam: Awake, laying in bed, in nad Respiratory system: Slightly increased respiratory effort, no wheezing Cardiovascular system: regular rate, s1, s2 Gastrointestinal system: Soft, nondistended, positive BS Central nervous system: CN2-12 grossly intact, strength intact Extremities: Perfused, no clubbing Skin: Normal skin turgor, no notable skin lesions  seen Psychiatry: Mood normal // no visual hallucinations   Data Reviewed: I have personally reviewed following labs and imaging studies  CBC: Recent Labs  Lab 05/15/20 0055 05/15/20 0055 05/15/20 1316 05/15/20 1901 05/15/20 2342 05/16/20 0307 05/17/20 0251  WBC 19.7*  --  23.1* 21.1*  --  19.3* 17.6*  NEUTROABS 17.7*  --  19.7* 17.0*  --  15.1* 12.2*  HGB 10.6*   < > 8.0* 8.0* 7.6* 7.3* 7.2*  HCT 33.9*   < > 24.8* 25.8* 23.9* 23.3* 23.0*  MCV 89.0  --  87.9 89.9  --  90.0 88.8  PLT 274  --  246 245  --  244 262   < > = values in this interval not displayed.   Basic Metabolic Panel: Recent Labs  Lab 05/14/20 0509 05/14/20 0509 05/15/20 0055 05/15/20 0344 05/15/20 1316 05/16/20 0307 05/17/20 0251  NA 139   < > 142 140 143 140 146*  K 4.5   < > 3.7 3.6 4.0 3.4* 3.7  CL 108   < > 104 104 105 104 107  CO2 23   < > 22 22 22 25 27   GLUCOSE 154*   < > 230* 296* 259* 161* 152*  BUN 30*   < > 37* 39* 41* 38* 33*  CREATININE 1.80*   < > 2.03* 2.06* 1.99* 1.83* 1.73*  CALCIUM 7.6*   < > 8.6* 8.3* 8.4* 8.0* 8.4*  MG 2.0  --  2.3  --   --  2.1 2.2  PHOS 3.3  --  3.5  --   --  2.9 2.5   < > = values in this interval not displayed.   GFR: Estimated Creatinine Clearance: 48.7 mL/min (A) (by C-G formula based on SCr of 1.73 mg/dL (H)). Liver Function Tests: Recent Labs  Lab 05/13/20 0054 05/13/20 0700 05/15/20 0344  AST 20 16 14*  ALT 23 19 17   ALKPHOS 59 45 47  BILITOT 1.2 1.2 0.8  PROT 7.7 6.6 7.1  ALBUMIN 2.8* 2.3* 2.3*   No results for input(s): LIPASE, AMYLASE in the last 168 hours. No results for input(s): AMMONIA in the last 168 hours. Coagulation Profile: Recent Labs  Lab 05/13/20 0054 05/13/20 0700  INR 1.3* 1.4*   Cardiac Enzymes: No results for input(s): CKTOTAL, CKMB, CKMBINDEX, TROPONINI in the last 168 hours. BNP (last 3 results) No results for input(s): PROBNP in the last 8760  hours. HbA1C: No results for input(s): HGBA1C in the last 72  hours. CBG: Recent Labs  Lab 05/16/20 1157 05/16/20 1651 05/16/20 2144 05/17/20 0739 05/17/20 1149  GLUCAP 147* 148* 168* 142* 269*   Lipid Profile: No results for input(s): CHOL, HDL, LDLCALC, TRIG, CHOLHDL, LDLDIRECT in the last 72 hours. Thyroid Function Tests: No results for input(s): TSH, T4TOTAL, FREET4, T3FREE, THYROIDAB in the last 72 hours. Anemia Panel: No results for input(s): VITAMINB12, FOLATE, FERRITIN, TIBC, IRON, RETICCTPCT in the last 72 hours. Sepsis Labs: Recent Labs  Lab 05/13/20 0340 05/13/20 0700 05/14/20 0509 05/15/20 0055 05/15/20 0344 05/15/20 1316 05/15/20 1901 05/15/20 2342 05/16/20 0307  PROCALCITON  --  15.16 11.37 7.46  --   --   --   --   --   LATICACIDVEN   < >  --   --  4.5*   < > 3.0* 2.9* 2.3* 2.1*   < > = values in this interval not displayed.    Recent Results (from the past 240 hour(s))  Blood Culture (routine x 2)     Status: Abnormal   Collection Time: 05/13/20 12:10 AM   Specimen: BLOOD  Result Value Ref Range Status   Specimen Description   Final    BLOOD RIGHT ANTECUBITAL Performed at [facility] [address], [place], [place] [address]    Special Requests   Final    BOTTLES DRAWN AEROBIC AND ANAEROBIC Blood Culture adequate volume Performed at [facility] [address], [place], [place] [address]    Culture  Setup Time   Final    GRAM NEGATIVE RODS IN BOTH AEROBIC AND ANAEROBIC BOTTLES Organism ID to follow CRITICAL RESULT CALLED TO, READ BACK BY AND VERIFIED WITH: Shelda Jakes PHARMD, AT 1704 05/13/20 BY Rush [facility]mark Performed at [facility], [place] [address], [place], [place] [address]    Culture ESCHERICHIA COLI (A)  Final   Report Status 05/15/2020 FINAL  Final   Organism ID, Bacteria ESCHERICHIA COLI  Final      Susceptibility   Escherichia coli - MIC*    AMPICILLIN >=32 RESISTANT Resistant     CEFAZOLIN >=64 RESISTANT Resistant     CEFEPIME <=0.12 SENSITIVE  Sensitive     CEFTAZIDIME <=1 SENSITIVE Sensitive     CEFTRIAXONE 1 SENSITIVE Sensitive     CIPROFLOXACIN <=0.25 SENSITIVE Sensitive     GENTAMICIN <=1 SENSITIVE Sensitive     IMIPENEM <=0.25 SENSITIVE Sensitive     TRIMETH/SULFA <=20 SENSITIVE Sensitive     AMPICILLIN/SULBACTAM >=32 RESISTANT Resistant     PIP/TAZO 8 SENSITIVE Sensitive     * ESCHERICHIA COLI  Blood Culture ID Panel (Reflexed)     Status: Abnormal   Collection Time: 05/13/20 12:10 AM  Result Value Ref Range Status   Enterococcus faecalis NOT DETECTED NOT DETECTED Final   Enterococcus Faecium NOT DETECTED NOT DETECTED Final   Listeria monocytogenes NOT DETECTED NOT DETECTED Final   Staphylococcus species NOT DETECTED NOT DETECTED Final   Staphylococcus aureus (BCID) NOT DETECTED NOT DETECTED Final   Staphylococcus epidermidis NOT DETECTED NOT DETECTED Final   Staphylococcus lugdunensis NOT DETECTED NOT DETECTED Final   Streptococcus species NOT DETECTED NOT DETECTED Final   Streptococcus agalactiae NOT DETECTED NOT DETECTED Final   Streptococcus pneumoniae NOT DETECTED NOT DETECTED Final   Streptococcus pyogenes NOT DETECTED NOT DETECTED Final   A.calcoaceticus-baumannii NOT DETECTED NOT DETECTED Final   Bacteroides fragilis NOT DETECTED NOT DETECTED Final  Enterobacterales DETECTED (A) NOT DETECTED Final    Comment: Enterobacterales represent a large order of gram negative bacteria, not a single organism. CRITICAL RESULT CALLED TO, READ BACK BY AND VERIFIED WITH: Shelda Jakes PHARMD, AT 1704 05/13/20 BY D. VANHOOK    Enterobacter cloacae complex NOT DETECTED NOT DETECTED Final   Escherichia coli DETECTED (A) NOT DETECTED Final    Comment: CRITICAL RESULT CALLED TO, READ BACK BY AND VERIFIED WITH: Shelda Jakes PHARMD, AT 1704 05/13/20 BY D. VANHOOK    Klebsiella aerogenes NOT DETECTED NOT DETECTED Final   Klebsiella oxytoca NOT DETECTED NOT DETECTED Final   Klebsiella pneumoniae NOT DETECTED NOT DETECTED Final    Proteus species NOT DETECTED NOT DETECTED Final   Salmonella species NOT DETECTED NOT DETECTED Final   Serratia marcescens NOT DETECTED NOT DETECTED Final   Haemophilus influenzae NOT DETECTED NOT DETECTED Final   Neisseria meningitidis NOT DETECTED NOT DETECTED Final   Pseudomonas aeruginosa NOT DETECTED NOT DETECTED Final   Stenotrophomonas maltophilia NOT DETECTED NOT DETECTED Final   Candida albicans NOT DETECTED NOT DETECTED Final   Candida auris NOT DETECTED NOT DETECTED Final   Candida glabrata NOT DETECTED NOT DETECTED Final   Candida krusei NOT DETECTED NOT DETECTED Final   Candida parapsilosis NOT DETECTED NOT DETECTED Final   Candida tropicalis NOT DETECTED NOT DETECTED Final   Cryptococcus neoformans/gattii NOT DETECTED NOT DETECTED Final   CTX-M ESBL NOT DETECTED NOT DETECTED Final   Carbapenem resistance IMP NOT DETECTED NOT DETECTED Final   Carbapenem resistance KPC NOT DETECTED NOT DETECTED Final   Carbapenem resistance NDM NOT DETECTED NOT DETECTED Final   Carbapenem resist OXA 48 LIKE NOT DETECTED NOT DETECTED Final   Carbapenem resistance VIM NOT DETECTED NOT DETECTED Final    Comment: Performed at [facility], [address] [place], [place] [address]  Blood Culture (routine x 2)     Status: None (Preliminary result)   Collection Time: 05/13/20 12:54 AM   Specimen: BLOOD LEFT FOREARM  Result Value Ref Range Status   Specimen Description   Final    BLOOD LEFT FOREARM Performed at [facility] [address] [place], [place] [address]    Special Requests   Final    BOTTLES DRAWN AEROBIC AND ANAEROBIC Blood Culture results may not be optimal due to an excessive volume of blood received in culture bottles Performed at [facility] [address], [place], [place] [address]    Culture   Final    NO GROWTH 3 DAYS Performed at [facility], [place] [address] [place], [place] [address]    Report Status PENDING   Incomplete  Urine culture     Status: Abnormal   Collection Time: 05/13/20 12:54 AM   Specimen: In/Out Cath Urine  Result Value Ref Range Status   Specimen Description   Final    IN/OUT CATH URINE Performed at [facility] [address], [place], [place] [address]    Special Requests   Final    NONE Performed at [facility], [place] [address], [place], [place] [address]    Culture >=100,000 COLONIES/mL ESCHERICHIA COLI (A)  Final   Report Status 05/15/2020 FINAL  Final   Organism ID, Bacteria ESCHERICHIA COLI (A)  Final      Susceptibility   Escherichia coli - MIC*    AMPICILLIN >=32 RESISTANT Resistant     CEFAZOLIN 8 SENSITIVE Sensitive     CEFTRIAXONE  0.5 SENSITIVE Sensitive     CIPROFLOXACIN <=0.25 SENSITIVE Sensitive     GENTAMICIN <=1 SENSITIVE Sensitive     IMIPENEM <=0.25 SENSITIVE Sensitive     NITROFURANTOIN <=16 SENSITIVE Sensitive     TRIMETH/SULFA <=20 SENSITIVE Sensitive     AMPICILLIN/SULBACTAM >=32 RESISTANT Resistant     PIP/TAZO 8 SENSITIVE Sensitive     * >=100,000 COLONIES/mL ESCHERICHIA COLI  Respiratory Panel by RT PCR (Flu A&B, Covid) - Nasopharyngeal Swab     Status: None   Collection Time: 05/13/20  1:26 AM   Specimen: Nasopharyngeal Swab  Result Value Ref Range Status   SARS Coronavirus 2 by RT PCR NEGATIVE NEGATIVE Final    Comment: (NOTE) SARS-CoV-2 target nucleic acids are NOT DETECTED.  The SARS-CoV-2 RNA is generally detectable in upper respiratoy specimens during the acute phase of infection. The lowest concentration of SARS-CoV-2 viral copies this assay can detect is 131 copies/mL. A negative result does not preclude SARS-Cov-2 infection and should not be used as the sole basis for treatment or other patient management decisions. A negative result may occur with  improper specimen collection/handling, submission of specimen other than nasopharyngeal swab, presence of viral mutation(s) within the areas  targeted by this assay, and inadequate number of viral copies (<131 copies/mL). A negative result must be combined with clinical observations, patient history, and epidemiological information. The expected result is Negative.  Fact Sheet for Patients:  PinkCheek.be  Fact Sheet for Healthcare Providers:  GravelBags.it  This test is no t yet approved or cleared by the [place] FDA and  has been authorized for detection and/or diagnosis of SARS-CoV-2 by FDA under an Emergency Use Authorization (EUA). This EUA will remain  in effect (meaning this test can be used) for the duration of the COVID-19 declaration under Section 564(b)(1) of the Act, 21 U.S.C. section 360bbb-3(b)(1), unless the authorization is terminated or revoked sooner.     Influenza A by PCR NEGATIVE NEGATIVE Final   Influenza B by PCR NEGATIVE NEGATIVE Final    Comment: (NOTE) The Xpert Xpress SARS-CoV-2/FLU/RSV assay is intended as an aid in  the diagnosis of influenza from Nasopharyngeal swab specimens and  should not be used as a sole basis for treatment. Nasal washings and  aspirates are unacceptable for Xpert Xpress SARS-CoV-2/FLU/RSV  testing.  Fact Sheet for Patients: PinkCheek.be  Fact Sheet for Healthcare Providers: GravelBags.it  This test is not yet approved or cleared by the [place] FDA and  has been authorized for detection and/or diagnosis of SARS-CoV-2 by  FDA under an Emergency Use Authorization (EUA). This EUA will remain  in effect (meaning this test can be used) for the duration of the  Covid-19 declaration under Section 564(b)(1) of the Act, 21  U.S.C. section 360bbb-3(b)(1), unless the authorization is  terminated or revoked. Performed at [facility], [place] [address], [place], [place] [address]   Aerobic/Anaerobic Culture (surgical/deep wound)      Status: None (Preliminary result)   Collection Time: 05/13/20  6:20 AM   Specimen: Abdomen; Abscess  Result Value Ref Range Status   Specimen Description   Final    ABDOMEN Performed at [facility] [address], [place], [place] [address]    Special Requests   Final    NONE Performed at [facility], [place] [address] [place], [place] [address]    Gram Stain   Final    ABUNDANT WBC PRESENT, PREDOMINANTLY PMN MODERATE GRAM NEGATIVE RODS Performed  at [facility], [place] [address], [place], [place] [address]    Culture   Final    ABUNDANT ESCHERICHIA COLI NO ANAEROBES ISOLATED; CULTURE IN PROGRESS FOR 5 DAYS    Report Status PENDING  Incomplete   Organism ID, Bacteria ESCHERICHIA COLI  Final      Susceptibility   Escherichia coli - MIC*    AMPICILLIN >=32 RESISTANT Resistant     CEFAZOLIN 8 SENSITIVE Sensitive     CEFEPIME <=0.12 SENSITIVE Sensitive     CEFTAZIDIME <=1 SENSITIVE Sensitive     CEFTRIAXONE <=0.25 SENSITIVE Sensitive     CIPROFLOXACIN <=0.25 SENSITIVE Sensitive     GENTAMICIN <=1 SENSITIVE Sensitive     IMIPENEM <=0.25 SENSITIVE Sensitive     TRIMETH/SULFA <=20 SENSITIVE Sensitive     AMPICILLIN/SULBACTAM >=32 RESISTANT Resistant     PIP/TAZO 8 SENSITIVE Sensitive     * ABUNDANT ESCHERICHIA COLI  Culture, blood (routine x 2)     Status: None (Preliminary result)   Collection Time: 05/14/20  5:00 AM   Specimen: BLOOD  Result Value Ref Range Status   Specimen Description   Final    BLOOD RIGHT ANTECUBITAL Performed at [facility] [address], [place], [place] [address]    Special Requests   Final    BOTTLES DRAWN AEROBIC AND ANAEROBIC Blood Culture adequate volume Performed at [facility] [address] [place], [place] [address]    Culture   Final    NO GROWTH 2 DAYS Performed at [facility] [address], [place], [place] [address]    Report Status  PENDING  Incomplete  Culture, blood (routine x 2)     Status: None (Preliminary result)   Collection Time: 05/14/20  5:05 AM   Specimen: BLOOD  Result Value Ref Range Status   Specimen Description   Final    BLOOD RIGHT ANTECUBITAL Performed at [facility] [address] [place], [place] [address]    Special Requests   Final    BOTTLES DRAWN AEROBIC AND ANAEROBIC Blood Culture adequate volume Performed at [facility] [address] [place], [place] [address]    Culture   Final    NO GROWTH 2 DAYS Performed at [facility] [address] [place], [place] [address]    Report Status PENDING  Incomplete  MRSA PCR Screening     Status: Abnormal   Collection Time: 05/14/20  6:32 PM   Specimen: Nasopharyngeal  Result Value Ref Range Status   MRSA by PCR POSITIVE (A) NEGATIVE Final    Comment:        The GeneXpert MRSA Assay (FDA approved for NASAL specimens only), is one component of a comprehensive MRSA colonization surveillance program. It is not intended to diagnose MRSA infection nor to guide or monitor treatment for MRSA infections. RESULT CALLED TO, READ BACK BY AND VERIFIED WITH: Gavin Potters, K. RN @1028  ON 10.5.2021 BY [facility] Performed at [facility], [place] [address], [place], [place] [address]      Radiology Studies: DG CHEST PORT 1 VIEW  Result Date: 05/16/2020 CLINICAL DATA:  Hypoxia. EXAM: PORTABLE CHEST 1 VIEW COMPARISON:  05/13/2020 FINDINGS: 0958 hours. Low volume rotated film. The cardio pericardial silhouette is enlarged. There is pulmonary vascular congestion without overt pulmonary edema. Patchy airspace opacity at the lung bases compatible with atelectasis or infiltrate. No substantial pleural effusion. The visualized bony structures of the thorax  show no acute abnormality. Telemetry leads overlie the chest. IMPRESSION: 1. Cardiomegaly with vascular congestion. 2. Bibasilar atelectasis  or infiltrate. Electronically Signed   By: Misty Stanley M.D.   On: 05/16/2020 10:15   ECHOCARDIOGRAM COMPLETE  Result Date: 05/16/2020    ECHOCARDIOGRAM REPORT   Patient Name:   Marco [name] Date of Exam: 05/16/2020 Medical Rec #:  [id number]       Height:       66.0 in Accession #:    [id number]      Weight:       234.1 lb Date of Birth:  [birth year]       BSA:          2.138 m Patient Age:    19 years        BP:           175/82 mmHg Patient Gender: M               HR:           96 bpm. Exam Location:  Inpatient Procedure: 2D Echo and Intracardiac Opacification Agent Indications:    I31.3 Pericardial effusion (noninflammatory)  History:        Patient has prior history of Echocardiogram examinations, most                 recent 12/31/2019. Abnormal ECG, Arrythmias:Atrial Fibrillation,                 Signs/Symptoms:Dyspnea, Chest Pain, Altered Mental Status,                 Bacteremia and Fever; Risk Factors:Hypertension, Diabetes and                 Sleep Apnea.  Sonographer:    Roseanna Rainbow RDCS Referring Phys: [place]  Sonographer Comments: Technically difficult study due to poor echo windows and patient is morbidly obese. Image acquisition challenging due to patient body habitus. Patient in high fowler's position. IMPRESSIONS  1. Diffuse hypokinesis with abnormal septal motion . Left ventricular ejection fraction, by estimation, is 25 to 30%. The left ventricle has severely decreased function. The left ventricle demonstrates global hypokinesis. The left ventricular internal cavity size was moderately to severely dilated. There is moderate asymmetric left ventricular hypertrophy of the basal and septal segments. Left ventricular diastolic parameters were normal.  2. Right ventricular systolic function is normal. The right ventricular size is normal.  3. The pericardial effusion is lateral to the left ventricle.  4. The mitral valve is normal in structure. Trivial mitral valve regurgitation. No  evidence of mitral stenosis.  5. The aortic valve is normal in structure. Aortic valve regurgitation is not visualized. No aortic stenosis is present.  6. The inferior vena cava is normal in size with greater than 50% respiratory variability, suggesting right atrial pressure of 3 mmHg. FINDINGS  Left Ventricle: Diffuse hypokinesis with abnormal septal motion. Left ventricular ejection fraction, by estimation, is 25 to 30%. The left ventricle has severely decreased function. The left ventricle demonstrates global hypokinesis. Definity contrast agent was given IV to delineate the left ventricular endocardial borders. The left ventricular internal cavity size was moderately to severely dilated. There is moderate asymmetric left ventricular hypertrophy of the basal and septal segments. Left ventricular diastolic parameters were normal. Right Ventricle: The right ventricular size is normal. No increase in right ventricular wall thickness. Right ventricular systolic function is normal. Left Atrium: Left atrial size was normal in size.  Right Atrium: Right atrial size was normal in size. Pericardium: Trivial pericardial effusion is present. The pericardial effusion is lateral to the left ventricle. Mitral Valve: The mitral valve is normal in structure. Trivial mitral valve regurgitation. No evidence of mitral valve stenosis. Tricuspid Valve: The tricuspid valve is normal in structure. Tricuspid valve regurgitation is mild . No evidence of tricuspid stenosis. Aortic Valve: The aortic valve is normal in structure. Aortic valve regurgitation is not visualized. No aortic stenosis is present. Pulmonic Valve: The pulmonic valve was normal in structure. Pulmonic valve regurgitation is not visualized. No evidence of pulmonic stenosis. Aorta: The aortic root is normal in size and structure. Venous: The inferior vena cava is normal in size with greater than 50% respiratory variability, suggesting right atrial pressure of 3 mmHg.  IAS/Shunts: The interatrial septum was not well visualized.  LEFT VENTRICLE PLAX 2D LVIDd:         4.90 cm      Diastology LVIDs:         4.20 cm      LV e' medial:    4.90 cm/s LV PW:         1.80 cm      LV E/e' medial:  14.5 LV IVS:        1.60 cm      LV e' lateral:   7.18 cm/s LVOT diam:     2.40 cm      LV E/e' lateral: 9.9 LV SV:         70 LV SV Index:   33 LVOT Area:     4.52 cm  LV Volumes (MOD) LV vol d, MOD A2C: 145.0 ml LV vol d, MOD A4C: 152.0 ml LV vol s, MOD A2C: 89.8 ml LV vol s, MOD A4C: 101.0 ml LV SV MOD A2C:     55.2 ml LV SV MOD A4C:     152.0 ml LV SV MOD BP:      57.1 ml RIGHT VENTRICLE             IVC RV S prime:     17.90 cm/s  IVC diam: 1.50 cm TAPSE (M-mode): 2.5 cm LEFT ATRIUM           Index       RIGHT ATRIUM           Index LA diam:      4.20 cm 1.96 cm/m  RA Area:     11.50 cm LA Vol (A2C): 17.7 ml 8.28 ml/m  RA Volume:   22.70 ml  10.62 ml/m LA Vol (A4C): 40.9 ml 19.13 ml/m  AORTIC VALVE LVOT Vmax:   114.00 cm/s LVOT Vmean:  78.600 cm/s LVOT VTI:    0.154 m  AORTA Ao Root diam: 3.60 cm Ao Asc diam:  3.30 cm MITRAL VALVE MV Area (PHT): 3.31 cm    SHUNTS MV Decel Time: 229 msec    Systemic VTI:  0.15 m MV E velocity: 71.10 cm/s  Systemic Diam: 2.40 cm MV A velocity: 71.35 cm/s MV E/A ratio:  1.00 Jenkins Rouge MD Electronically signed by Jenkins Rouge MD Signature Date/Time: 05/16/2020/12:43:46 PM    Final    CT Angio Abd/Pel w/ and/or w/o  Result Date: 05/15/2020 CLINICAL DATA:  64 year old male with history of right perinephric fluid collection status post drainage on 05/13/2020 now with perinephric and retroperitoneal hemorrhage. Evaluate for active extravasation. EXAM: CT ANGIOGRAPHY ABDOMEN AND PELVIS WITH CONTRAST AND WITHOUT CONTRAST TECHNIQUE: Multidetector CT imaging of  the abdomen and pelvis was performed using the standard protocol during bolus administration of intravenous contrast. Multiplanar reconstructed images and MIPs were obtained and reviewed to evaluate  the vascular anatomy. CONTRAST:  29mL OMNIPAQUE IOHEXOL 350 MG/ML SOLN COMPARISON:  MRI abdomen from 03/22/2020, CT abdomen pelvis from 05/13/2020, CT-guided drain placement from 05/13/2020, and non contrasted CT abdomen pelvis from the same day. FINDINGS: VASCULAR Aorta: Scattered atherosclerotic calcifications without signficant stenosis, dissection, or aneurysm. Celiac: Patent without evidence of aneurysm, dissection, vasculitis or significant stenosis. SMA: Patent without evidence of aneurysm, dissection, vasculitis or significant stenosis. Renals: Single bilateral renal arteries are patent without evidence of aneurysm, dissection, vasculitis, fibromuscular dysplasia or significant stenosis. IMA: Patent without evidence of aneurysm, dissection, vasculitis or significant stenosis. Inflow: Patent without evidence of aneurysm, dissection, vasculitis or significant stenosis. Proximal Outflow: Bilateral common femoral and visualized portions of the superficial and profunda femoral arteries are patent without evidence of aneurysm, dissection, vasculitis or significant stenosis. Veins: No obvious venous abnormality within the limitations of this arterial phase study. Review of the MIP images confirms the above findings. NON-VASCULAR Lower chest: Similar appearing small bilateral pleural effusions, right greater than left with associated passive atelectasis. Similar appearing global cardiomegaly with small, incompletely visualized pericardial effusion. Bilateral symmetric gynecomastia. Hepatobiliary: Similar appearing wedge like hypodense regions in the left lower lobe of the liver. Unchanged ill-defined right posterior decreased hepatic attenuation and wedge like hypodensity in the subcapsular regions of segment 5. The gallbladder is present unremarkable. No intra or extrahepatic biliary ductal dilation. Pancreas: Unremarkable. No pancreatic ductal dilatation or surrounding inflammatory changes. Spleen: Normal in size  without focal abnormality. Adrenals/Urinary Tract: The adrenal glands are symmetric in size and enhancement bilaterally. Again seen is a prominent right perinephric hematoma measuring up to 9.4 x 5.3 cm in greatest axial dimension about the lower pole. Similar position of the indwelling pigtail catheter which abuts and potentially is within the right renal cortex. There is a single focus of gas within the anterior and superior aspect of the perinephric hematoma. The hematoma extends into the right retroperitoneum, unchanged from comparison study from approximately 3 hours prior. No evidence of active extravasation. Unchanged simple cysts in the left kidney. No hydronephrosis or nephrolithiasis. Foley catheter is in place within the bladder which is moderately distended. No bladder wall thickening. A doses Aleene Davidson now Stomach/Bowel: Stomach is within normal limits. Appendix appears normal. No evidence of bowel wall thickening, distention, or inflammatory changes. Postsurgical changes after right hemicolectomy. Sigmoid diverticula without surrounding inflammatory changes. Lymphatic: No abdominopelvic lymphadenopathy Reproductive: Prostate is unremarkable. Other: Unchanged appearance of previously visualized right perinephric and retroperitoneal fluid collection with heterogeneous attenuation, no evidence of active extravasation. Musculoskeletal: No acute or significant osseous findings. IMPRESSION: VASCULAR No acute vascular abnormality. No evidence of active extravasation in the right retroperitoneum. Aortic Atherosclerosis (ICD10-I70.0). NON-VASCULAR 1. Similar appearing right perinephric hematoma extending into the right retroperitoneum without active extravasation, likely related to positioning of the indwelling drainage catheter. 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 2. Similar appearing wedge like hypodensities in the left and right lobes of the liver, indeterminate but could  be seen with infarction or abscess formation. Recommend attention on follow-up. 3. Similar appearing small bilateral pleural effusions with associated passive atelectasis. Ruthann Cancer, MD [facility] Electronically Signed   By: Ruthann Cancer MD   On: 05/15/2020 16:31    Scheduled Meds:  carvedilol  12.5 mg Oral BID WC   Chlorhexidine  Gluconate Cloth  6 each Topical Daily   furosemide  40 mg Intravenous Daily   insulin aspart  0-9 Units Subcutaneous TID AC & HS   mupirocin ointment  1 application Nasal BID   predniSONE  5 mg Oral Q breakfast   sodium chloride flush  5 mL Intracatheter Q8H   Continuous Infusions:  cefTRIAXone (ROCEPHIN)  IV Stopped (05/16/20 1816)   metronidazole Stopped (05/17/20 0926)     LOS: 4 days   Marylu Lund, MD [facility] Pager On Amion  If 7PM-7AM, please contact night-coverage 05/17/2020, 1:36 PM

## 2020-05-17 NOTE — Progress Notes (Signed)
Referring Physician(s): Pace,M  Supervising Physician: Jacqulynn Cadet  Patient Status:  [facility] - In-pt  Chief Complaint: Right perinephric abscess   Subjective: Patient complaining of some right lateral abdominal/flank discomfort about the same as yesterday; denies nausea/vomiting; more talkative and answering questions okay   Allergies: Nsaids and Jardiance [empagliflozin]  Medications: Prior to Admission medications   Medication Sig Start Date End Date Taking? Authorizing Provider  albuterol (PROVENTIL HFA;VENTOLIN HFA) 108 (90 Base) MCG/ACT inhaler Inhale 2 puffs into the lungs every 6 (six) hours as needed for wheezing or shortness of breath. 04/29/18  Yes [name], MD  allopurinol (ZYLOPRIM) 100 MG tablet Take 200 mg by mouth every evening.    Yes [provider]  aspirin (EQ ASPIRIN ADULT LOW DOSE) 81 MG EC tablet TAKE 1 TABLET BY MOUTH ONCE DAILY. SWALLOW WHOLE Patient taking differently: Take 81 mg by mouth daily. TAKE 1 TABLET BY MOUTH ONCE DAILY. SWALLOW WHOLE 02/29/20  Yes [name], MD  atorvastatin (LIPITOR) 40 MG tablet Take 1 tablet (40 mg total) by mouth daily. 09/27/19  Yes Sueanne Margarita, MD  carvedilol (COREG) 12.5 MG tablet Take 1 tablet (12.5 mg total) by mouth 2 (two) times daily with a meal. 09/12/19  Yes Larey Dresser, MD  cyclobenzaprine (FLEXERIL) 5 MG tablet Take 5 mg by mouth every 12 (twelve) hours. 04/05/20  Yes [provider]  diclofenac Sodium (VOLTAREN) 1 % GEL Apply 4 g topically 4 (four) times daily. Patient taking differently: Apply 4 g topically 4 (four) times daily as needed (pain).  10/23/19  Yes Darr, Marguerita Beards, [name]  DULoxetine (CYMBALTA) 20 MG capsule TAKE 1 CAPSULE BY MOUTH ONCE DAILY Patient taking differently: Take 20 mg by mouth daily.  03/27/20  Yes [name], MD  famotidine (PEPCID) 20 MG tablet TAKE 1 TABLET BY MOUTH TWO TIMES DAILY Patient taking differently: Take 20 mg by mouth 2 (two)  times daily.  04/18/20  Yes [name], MD  ferrous sulfate 325 (65 FE) MG tablet Take 1 tablet (325 mg total) by mouth 2 (two) times daily with a meal. 01/14/20  Yes Lavina Hamman, MD  furosemide (LASIX) 80 MG tablet Take 80 mg by mouth daily.    Yes [provider]  Insulin Lispro Prot & Lispro (HUMALOG MIX 75/25 KWIKPEN) (75-25) 100 UNIT/ML Kwikpen Inject 100 Units into the skin daily with breakfast. 12/20/19  Yes Renato Shin, MD  isosorbide mononitrate (IMDUR) 30 MG 24 hr tablet Take 30 mg by mouth daily. 01/19/20  Yes [provider]  naloxone [facility]) nasal spray 4 mg/0.1 mL Place in nostril in the event of suspected overdose on Narcotic medication (Oxycodone) 02/16/20  Yes [name], MD  oxyCODONE (OXY IR/ROXICODONE) 5 MG immediate release tablet Take 5 mg by mouth 3 (three) times daily as needed for moderate pain.  01/24/20  Yes [provider]  polyethylene glycol (MIRALAX / GLYCOLAX) 17 g packet Take 17 g by mouth daily as needed for moderate constipation or severe constipation. 01/14/20  Yes Lavina Hamman, MD  Potassium Chloride ER 20 MEQ TBCR Take 20 mEq by mouth daily.  01/15/20  Yes [provider]  predniSONE (DELTASONE) 5 MG tablet Take 5 mg by mouth daily. 11/23/19  Yes [provider]  pregabalin (LYRICA) 100 MG capsule Take 100 mg by mouth 3 (three) times daily.    Yes [provider]  ramelteon (ROZEREM) 8 MG tablet TAKE 1 TABLET  BY MOUTH AT BEDTIME Patient taking differently: Take 8 mg by mouth at bedtime.  04/18/20  Yes [name], MD  Secukinumab (COSENTYX) 150 MG/ML SOSY Inject 150 mg into the skin every 28 (twenty-eight) days. Hold until told to resume by PCP 01/14/20  Yes Lavina Hamman, MD  Tafamidis [facility]) 61 MG CAPS Take 61 mg by mouth daily. 09/08/19  Yes Bensimhon, Shaune Pascal, MD  Accu-Chek FastClix Lancets MISC 1 each by Other route 2 (two) times daily. E11.42 03/27/20   Renato Shin, MD  ACCU-CHEK  GUIDE test strip USE 1 STRIP TWICE DAILY TO CHECK BLOOD GLUCOSE 02/13/20   Renato Shin, MD  Insulin Pen Needle 32G X 4 MM MISC 1 each by Does not apply route daily. E11.9 03/06/20   Renato Shin, MD  sacubitril-valsartan (ENTRESTO) 24-26 MG Take 1 tablet by mouth 2 (two) times daily. 05/11/20   Bensimhon, Shaune Pascal, MD     Vital Signs: BP (!) 162/125   Pulse 88   Temp 99.5 F (37.5 C)   Resp 17   Ht 5\' 6"  (1.676 m)   Wt 234 lb 2.1 oz (106.2 kg)   SpO2 (!) 87%   BMI 37.79 kg/m   Physical Exam awake, answering questions okay, right flank drain intact, insertion site mild to moderately tender, output approximately 5 cc bloody fluid.  Imaging: DG CHEST PORT 1 VIEW  Result Date: 05/16/2020 CLINICAL DATA:  Hypoxia. EXAM: PORTABLE CHEST 1 VIEW COMPARISON:  05/13/2020 FINDINGS: 0958 hours. Low volume rotated film. The cardio pericardial silhouette is enlarged. There is pulmonary vascular congestion without overt pulmonary edema. Patchy airspace opacity at the lung bases compatible with atelectasis or infiltrate. No substantial pleural effusion. The visualized bony structures of the thorax show no acute abnormality. Telemetry leads overlie the chest. IMPRESSION: 1. Cardiomegaly with vascular congestion. 2. Bibasilar atelectasis or infiltrate. Electronically Signed   By: Misty Stanley M.D.   On: 05/16/2020 10:15   ECHOCARDIOGRAM COMPLETE  Result Date: 05/16/2020    ECHOCARDIOGRAM REPORT   Patient Name:   Marco [name] Date of Exam: 05/16/2020 Medical Rec #:  [id number]       Height:       66.0 in Accession #:    [id number]      Weight:       234.1 lb Date of Birth:  09/25/1955       BSA:          2.138 m Patient Age:    64 years        BP:           175/82 mmHg Patient Gender: M               HR:           96 bpm. Exam Location:  Inpatient Procedure: 2D Echo and Intracardiac Opacification Agent Indications:    I31.3 Pericardial effusion (noninflammatory)  History:        Patient has prior history of  Echocardiogram examinations, most                 recent 12/31/2019. Abnormal ECG, Arrythmias:Atrial Fibrillation,                 Signs/Symptoms:Dyspnea, Chest Pain, Altered Mental Status,                 Bacteremia and Fever; Risk Factors:Hypertension, Diabetes and  Sleep Apnea.  Sonographer:    Roseanna Rainbow RDCS Referring Phys: [place]  Sonographer Comments: Technically difficult study due to poor echo windows and patient is morbidly obese. Image acquisition challenging due to patient body habitus. Patient in high fowler's position. IMPRESSIONS  1. Diffuse hypokinesis with abnormal septal motion . Left ventricular ejection fraction, by estimation, is 25 to 30%. The left ventricle has severely decreased function. The left ventricle demonstrates global hypokinesis. The left ventricular internal cavity size was moderately to severely dilated. There is moderate asymmetric left ventricular hypertrophy of the basal and septal segments. Left ventricular diastolic parameters were normal.  2. Right ventricular systolic function is normal. The right ventricular size is normal.  3. The pericardial effusion is lateral to the left ventricle.  4. The mitral valve is normal in structure. Trivial mitral valve regurgitation. No evidence of mitral stenosis.  5. The aortic valve is normal in structure. Aortic valve regurgitation is not visualized. No aortic stenosis is present.  6. The inferior vena cava is normal in size with greater than 50% respiratory variability, suggesting right atrial pressure of 3 mmHg. FINDINGS  Left Ventricle: Diffuse hypokinesis with abnormal septal motion. Left ventricular ejection fraction, by estimation, is 25 to 30%. The left ventricle has severely decreased function. The left ventricle demonstrates global hypokinesis. Definity contrast agent was given IV to delineate the left ventricular endocardial borders. The left ventricular internal cavity size was moderately to severely  dilated. There is moderate asymmetric left ventricular hypertrophy of the basal and septal segments. Left ventricular diastolic parameters were normal. Right Ventricle: The right ventricular size is normal. No increase in right ventricular wall thickness. Right ventricular systolic function is normal. Left Atrium: Left atrial size was normal in size. Right Atrium: Right atrial size was normal in size. Pericardium: Trivial pericardial effusion is present. The pericardial effusion is lateral to the left ventricle. Mitral Valve: The mitral valve is normal in structure. Trivial mitral valve regurgitation. No evidence of mitral valve stenosis. Tricuspid Valve: The tricuspid valve is normal in structure. Tricuspid valve regurgitation is mild . No evidence of tricuspid stenosis. Aortic Valve: The aortic valve is normal in structure. Aortic valve regurgitation is not visualized. No aortic stenosis is present. Pulmonic Valve: The pulmonic valve was normal in structure. Pulmonic valve regurgitation is not visualized. No evidence of pulmonic stenosis. Aorta: The aortic root is normal in size and structure. Venous: The inferior vena cava is normal in size with greater than 50% respiratory variability, suggesting right atrial pressure of 3 mmHg. IAS/Shunts: The interatrial septum was not well visualized.  LEFT VENTRICLE PLAX 2D LVIDd:         4.90 cm      Diastology LVIDs:         4.20 cm      LV e' medial:    4.90 cm/s LV PW:         1.80 cm      LV E/e' medial:  14.5 LV IVS:        1.60 cm      LV e' lateral:   7.18 cm/s LVOT diam:     2.40 cm      LV E/e' lateral: 9.9 LV SV:         70 LV SV Index:   33 LVOT Area:     4.52 cm  LV Volumes (MOD) LV vol d, MOD A2C: 145.0 ml LV vol d, MOD A4C: 152.0 ml LV vol s, MOD A2C: 89.8  ml LV vol s, MOD A4C: 101.0 ml LV SV MOD A2C:     55.2 ml LV SV MOD A4C:     152.0 ml LV SV MOD BP:      57.1 ml RIGHT VENTRICLE             IVC RV S prime:     17.90 cm/s  IVC diam: 1.50 cm TAPSE (M-mode):  2.5 cm LEFT ATRIUM           Index       RIGHT ATRIUM           Index LA diam:      4.20 cm 1.96 cm/m  RA Area:     11.50 cm LA Vol (A2C): 17.7 ml 8.28 ml/m  RA Volume:   22.70 ml  10.62 ml/m LA Vol (A4C): 40.9 ml 19.13 ml/m  AORTIC VALVE LVOT Vmax:   114.00 cm/s LVOT Vmean:  78.600 cm/s LVOT VTI:    0.154 m  AORTA Ao Root diam: 3.60 cm Ao Asc diam:  3.30 cm MITRAL VALVE MV Area (PHT): 3.31 cm    SHUNTS MV Decel Time: 229 msec    Systemic VTI:  0.15 m MV E velocity: 71.10 cm/s  Systemic Diam: 2.40 cm MV A velocity: 71.35 cm/s MV E/A ratio:  1.00 Jenkins Rouge MD Electronically signed by Jenkins Rouge MD Signature Date/Time: 05/16/2020/12:43:46 PM    Final    CT RENAL STONE STUDY  Addendum Date: 05/16/2020   ADDENDUM REPORT: 05/16/2020 09:04 ADDENDUM: There is an unchanged 47mm proximal left nephrolithiasis. Electronically Signed   By: Valentino Saxon MD   On: 05/16/2020 09:04   Result Date: 05/16/2020 CLINICAL DATA:  Sepsis EXAM: CT ABDOMEN AND PELVIS WITHOUT CONTRAST TECHNIQUE: Multidetector CT imaging of the abdomen and pelvis was performed following the standard protocol without IV contrast. COMPARISON:  05/13/2020. FINDINGS: Lower chest: Small bilateral pleural effusions. Bibasilar consolidative opacities with centrilobular ground-glass nodularity of the RIGHT lower lobe. Hepatobiliary: Unchanged wedge-shaped hypodense lesions of the LEFT liver. Unchanged subcapsular lesion of the inferior RIGHT liver, better assessed on prior MRI. Small amount of fluid tracking along the liver, minimally increased. Excreted contrast versus gallstones within the gallbladder. Pancreas: No new focal fat stranding. Spleen: No new focal abnormality. Adrenals/Urinary Tract: Interval placement of a drain in the RIGHT perinephric abscess. There is new high density material in the Perirenal fossa which distorts the contours of the kidney. High density material tracks in the retroperitoneum. There is a small amount of layering  seen within this high density material which could reflect active bleeding. Unchanged hypodense lesion of the LEFT kidney. Adrenals are unremarkable. Bladder is partially decompressed around a Foley catheter. Small amount of intraluminal air consistent with intervention. Stomach/Bowel: No evidence of bowel obstruction. Diverticulosis without evidence of diverticulitis. Status post RIGHT colonic resection. Vascular/Lymphatic: Atherosclerotic calcifications of the aorta. No new suspicious lymph nodes. Reproductive: Coarse calcifications of the prostate. Other: Small fat containing inguinal hernias. Small amount of free fluid. Musculoskeletal: Remote LEFT rib fracture of the posterior twelfth rib. IMPRESSION: 1. Interval placement of a drain in the RIGHT perinephric abscess. There is new high density material in the perirenal fossa which distorts the kidney (Page kidney morphology). Blood products track in the retroperitoneum. There is a small amount of layering blood products seen which could reflect active bleeding. Consider dedicated contrast enhanced CT to evaluate for active extravasation or pseudoaneurysm formation. 2. Small bilateral pleural effusions. 3. Bibasilar consolidative opacities with centrilobular ground-glass  nodularity of the RIGHT lower lobe. Findings may reflect aspiration superimposed on atelectasis. Differential considerations include infection. These results were called by telephone at the time of interpretation on 05/15/2020 at 12:37 pm to provider DAVID GIRGUIS , who verbally acknowledged these results. Aortic Atherosclerosis (ICD10-I70.0). Electronically Signed: By: Valentino Saxon MD On: 05/15/2020 12:47   CT Angio Abd/Pel w/ and/or w/o  Result Date: 05/15/2020 CLINICAL DATA:  64 year old male with history of right perinephric fluid collection status post drainage on 05/13/2020 now with perinephric and retroperitoneal hemorrhage. Evaluate for active extravasation. EXAM: CT ANGIOGRAPHY  ABDOMEN AND PELVIS WITH CONTRAST AND WITHOUT CONTRAST TECHNIQUE: Multidetector CT imaging of the abdomen and pelvis was performed using the standard protocol during bolus administration of intravenous contrast. Multiplanar reconstructed images and MIPs were obtained and reviewed to evaluate the vascular anatomy. CONTRAST:  1mL OMNIPAQUE IOHEXOL 350 MG/ML SOLN COMPARISON:  MRI abdomen from 03/22/2020, CT abdomen pelvis from 05/13/2020, CT-guided drain placement from 05/13/2020, and non contrasted CT abdomen pelvis from the same day. FINDINGS: VASCULAR Aorta: Scattered atherosclerotic calcifications without signficant stenosis, dissection, or aneurysm. Celiac: Patent without evidence of aneurysm, dissection, vasculitis or significant stenosis. SMA: Patent without evidence of aneurysm, dissection, vasculitis or significant stenosis. Renals: Single bilateral renal arteries are patent without evidence of aneurysm, dissection, vasculitis, fibromuscular dysplasia or significant stenosis. IMA: Patent without evidence of aneurysm, dissection, vasculitis or significant stenosis. Inflow: Patent without evidence of aneurysm, dissection, vasculitis or significant stenosis. Proximal Outflow: Bilateral common femoral and visualized portions of the superficial and profunda femoral arteries are patent without evidence of aneurysm, dissection, vasculitis or significant stenosis. Veins: No obvious venous abnormality within the limitations of this arterial phase study. Review of the MIP images confirms the above findings. NON-VASCULAR Lower chest: Similar appearing small bilateral pleural effusions, right greater than left with associated passive atelectasis. Similar appearing global cardiomegaly with small, incompletely visualized pericardial effusion. Bilateral symmetric gynecomastia. Hepatobiliary: Similar appearing wedge like hypodense regions in the left lower lobe of the liver. Unchanged ill-defined right posterior decreased  hepatic attenuation and wedge like hypodensity in the subcapsular regions of segment 5. The gallbladder is present unremarkable. No intra or extrahepatic biliary ductal dilation. Pancreas: Unremarkable. No pancreatic ductal dilatation or surrounding inflammatory changes. Spleen: Normal in size without focal abnormality. Adrenals/Urinary Tract: The adrenal glands are symmetric in size and enhancement bilaterally. Again seen is a prominent right perinephric hematoma measuring up to 9.4 x 5.3 cm in greatest axial dimension about the lower pole. Similar position of the indwelling pigtail catheter which abuts and potentially is within the right renal cortex. There is a single focus of gas within the anterior and superior aspect of the perinephric hematoma. The hematoma extends into the right retroperitoneum, unchanged from comparison study from approximately 3 hours prior. No evidence of active extravasation. Unchanged simple cysts in the left kidney. No hydronephrosis or nephrolithiasis. Foley catheter is in place within the bladder which is moderately distended. No bladder wall thickening. A doses Aleene Davidson now Stomach/Bowel: Stomach is within normal limits. Appendix appears normal. No evidence of bowel wall thickening, distention, or inflammatory changes. Postsurgical changes after right hemicolectomy. Sigmoid diverticula without surrounding inflammatory changes. Lymphatic: No abdominopelvic lymphadenopathy Reproductive: Prostate is unremarkable. Other: Unchanged appearance of previously visualized right perinephric and retroperitoneal fluid collection with heterogeneous attenuation, no evidence of active extravasation. Musculoskeletal: No acute or significant osseous findings. IMPRESSION: VASCULAR No acute vascular abnormality. No evidence of active extravasation in the right retroperitoneum. Aortic Atherosclerosis (ICD10-I70.0). NON-VASCULAR 1. Similar appearing right  perinephric hematoma extending into the right  retroperitoneum without active extravasation, likely related to positioning of the indwelling drainage catheter. 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 2. Similar appearing wedge like hypodensities in the left and right lobes of the liver, indeterminate but could be seen with infarction or abscess formation. Recommend attention on follow-up. 3. Similar appearing small bilateral pleural effusions with associated passive atelectasis. Ruthann Cancer, MD [facility] Electronically Signed   By: Ruthann Cancer MD   On: 05/15/2020 16:31    Labs:  CBC: Recent Labs    05/15/20 1316 05/15/20 1316 05/15/20 1901 05/15/20 2342 05/16/20 0307 05/17/20 0251  WBC 23.1*  --  21.1*  --  19.3* 17.6*  HGB 8.0*   < > 8.0* 7.6* 7.3* 7.2*  HCT 24.8*   < > 25.8* 23.9* 23.3* 23.0*  PLT 246  --  245  --  244 262   < > = values in this interval not displayed.    COAGS: Recent Labs    12/08/19 2014 12/30/19 1106 05/13/20 0054 05/13/20 0700  INR 1.3* 1.2 1.3* 1.4*  APTT 33  --  31  --     BMP: Recent Labs    05/13/20 0700 05/13/20 0700 05/14/20 0509 05/14/20 0509 05/15/20 0055 05/15/20 0055 05/15/20 0344 05/15/20 1316 05/16/20 0307 05/17/20 0251  NA 139   < > 139   < > 142   < > 140 143 140 146*  K 5.4*   < > 4.5   < > 3.7   < > 3.6 4.0 3.4* 3.7  CL 104   < > 108   < > 104   < > 104 105 104 107  CO2 23   < > 23   < > 22   < > 22 22 25 27   GLUCOSE 162*   < > 154*   < > 230*   < > 296* 259* 161* 152*  BUN 31*   < > 30*   < > 37*   < > 39* 41* 38* 33*  CALCIUM 7.9*   < > 7.6*   < > 8.6*   < > 8.3* 8.4* 8.0* 8.4*  CREATININE 2.46*   < > 1.80*   < > 2.03*   < > 2.06* 1.99* 1.83* 1.73*  GFRNONAA 26*   < > 39*   < > 33*   < > 33* 34* 38* 41*  GFRAA 31*  --  45*  --  39*  --  38*  --   --   --    < > = values in this interval not displayed.    LIVER FUNCTION TESTS: Recent Labs    02/16/20 1218  05/13/20 0054 05/13/20 0700 05/15/20 0344  BILITOT 0.2 1.2 1.2 0.8  AST 9 20 16  14*  ALT 14 23 19 17   ALKPHOS 75 59 45 47  PROT 8.1 7.7 6.6 7.1  ALBUMIN 4.0 2.8* 2.3* 2.3*    Assessment and Plan: Patient with history of recurrent right perinephric abscess; status post drain placement on 10/3;  temp 99.5, BP 162/125, heart rate 88 ,WBC 17.6 down from 19.3, hemoglobin 7.2 down from 7.3, platelets 262K, creatinine 1.73 down from 1.83; drain fl/urine cx- e coli;  previous CT from 10/5 with right retroperitoneal hematoma, hypodensities in left and right lobes of liver question infarction versus abscess, unchanged 4 mm proximal left nephrolithiasis; no active bleeding noted on recent  CTA; cont drain for now/close lab monitoring; DO NOT FLUSH PERINEPHRIC DRAIN;additional plans as per urology/TRH ; f/u imaging once drain OP minimal over 3-4 day period or if clinical status worsens; control BP   Electronically Signed: D. Rowe Robert, [name] 05/17/2020, 12:08 PM   I spent a total of 15 minutes at the the patient's bedside AND on the patient's hospital floor or unit, greater than 50% of which was counseling/coordinating care for right perinephric abscess drain    Patient ID: Marco [name], male   DOB: October 21, 1955, 64 y.o.   MRN: [id number]

## 2020-05-18 ENCOUNTER — Inpatient Hospital Stay (HOSPITAL_COMMUNITY): Payer: Medicare Other

## 2020-05-18 DIAGNOSIS — N179 Acute kidney failure, unspecified: Secondary | ICD-10-CM | POA: Diagnosis not present

## 2020-05-18 DIAGNOSIS — G9341 Metabolic encephalopathy: Secondary | ICD-10-CM | POA: Diagnosis not present

## 2020-05-18 DIAGNOSIS — R6520 Severe sepsis without septic shock: Secondary | ICD-10-CM | POA: Diagnosis not present

## 2020-05-18 DIAGNOSIS — A419 Sepsis, unspecified organism: Secondary | ICD-10-CM | POA: Diagnosis not present

## 2020-05-18 LAB — CBC WITH DIFFERENTIAL/PLATELET
Abs Immature Granulocytes: 0.21 10*3/uL — ABNORMAL HIGH (ref 0.00–0.07)
Basophils Absolute: 0 10*3/uL (ref 0.0–0.1)
Basophils Relative: 0 %
Eosinophils Absolute: 0.5 10*3/uL (ref 0.0–0.5)
Eosinophils Relative: 3 %
HCT: 21.3 % — ABNORMAL LOW (ref 39.0–52.0)
Hemoglobin: 6.6 g/dL — CL (ref 13.0–17.0)
Immature Granulocytes: 1 %
Lymphocytes Relative: 11 %
Lymphs Abs: 2 10*3/uL (ref 0.7–4.0)
MCH: 27.5 pg (ref 26.0–34.0)
MCHC: 31 g/dL (ref 30.0–36.0)
MCV: 88.8 fL (ref 80.0–100.0)
Monocytes Absolute: 2.3 10*3/uL — ABNORMAL HIGH (ref 0.1–1.0)
Monocytes Relative: 13 %
Neutro Abs: 12.4 10*3/uL — ABNORMAL HIGH (ref 1.7–7.7)
Neutrophils Relative %: 72 %
Platelets: 259 10*3/uL (ref 150–400)
RBC: 2.4 MIL/uL — ABNORMAL LOW (ref 4.22–5.81)
RDW: 18.4 % — ABNORMAL HIGH (ref 11.5–15.5)
WBC: 17.4 10*3/uL — ABNORMAL HIGH (ref 4.0–10.5)
nRBC: 0.3 % — ABNORMAL HIGH (ref 0.0–0.2)

## 2020-05-18 LAB — AEROBIC/ANAEROBIC CULTURE W GRAM STAIN (SURGICAL/DEEP WOUND)

## 2020-05-18 LAB — BASIC METABOLIC PANEL
Anion gap: 12 (ref 5–15)
BUN: 28 mg/dL — ABNORMAL HIGH (ref 8–23)
CO2: 25 mmol/L (ref 22–32)
Calcium: 7.8 mg/dL — ABNORMAL LOW (ref 8.9–10.3)
Chloride: 102 mmol/L (ref 98–111)
Creatinine, Ser: 1.62 mg/dL — ABNORMAL HIGH (ref 0.61–1.24)
GFR calc non Af Amer: 44 mL/min — ABNORMAL LOW (ref >60)
Glucose, Bld: 183 mg/dL — ABNORMAL HIGH (ref 70–99)
Potassium: 3.4 mmol/L — ABNORMAL LOW (ref 3.5–5.1)
Sodium: 139 mmol/L (ref 135–145)

## 2020-05-18 LAB — CULTURE, BLOOD (ROUTINE X 2): Culture: NO GROWTH

## 2020-05-18 LAB — GLUCOSE, CAPILLARY
Glucose-Capillary: 149 mg/dL — ABNORMAL HIGH (ref 70–99)
Glucose-Capillary: 161 mg/dL — ABNORMAL HIGH (ref 70–99)

## 2020-05-18 LAB — PHOSPHORUS: Phosphorus: 2.4 mg/dL — ABNORMAL LOW (ref 2.5–4.6)

## 2020-05-18 LAB — PREPARE RBC (CROSSMATCH)

## 2020-05-18 LAB — MAGNESIUM: Magnesium: 2 mg/dL (ref 1.7–2.4)

## 2020-05-18 MED ORDER — POTASSIUM CHLORIDE 20 MEQ PO PACK
60.0000 meq | PACK | Freq: Once | ORAL | Status: AC
  Administered 2020-05-18: 60 meq via ORAL
  Filled 2020-05-18: qty 3

## 2020-05-18 MED ORDER — FUROSEMIDE 10 MG/ML IJ SOLN
40.0000 mg | Freq: Two times a day (BID) | INTRAMUSCULAR | Status: DC
  Administered 2020-05-18 – 2020-05-21 (×7): 40 mg via INTRAVENOUS
  Filled 2020-05-18 (×7): qty 4

## 2020-05-18 MED ORDER — SODIUM CHLORIDE 0.9% IV SOLUTION: Freq: Once | INTRAVENOUS | Status: AC

## 2020-05-18 NOTE — Progress Notes (Signed)
CRITICAL VALUE ALERT  Critical Value:  Hemoglobin - 6.6  Date & Time Notied:  05/18/2020 @ 0340  Provider Notified: X. Blount  Orders Received/Actions taken: Awaiting orders

## 2020-05-18 NOTE — Evaluation (Signed)
Physical Therapy Evaluation Patient Details Name: Marco [name] MRN: [id number] DOB: 1956-08-06 Today's Date: 05/18/2020   History of Present Illness  63 yo AA male with PMH significant for DMII, HTN, dCHF (EF most recently 50-55% in May, grade 1 DD), RA on biologic therapy and admitted for severe sepsis and Perinephric abscess s/p drain by IR on 10/3  Clinical Impression  Pt admitted with above diagnosis.  Pt currently with functional limitations due to the deficits listed below (see PT Problem List). Pt will benefit from skilled PT to increase their independence and safety with mobility to allow discharge to the venue listed below.  Pt assisted OOB and ambulated a few feet to recliner.  Pt just received 2nd unit of PRBCs and feeling better but fatigued.  Pt from home with spouse and reports using oxygen as needed at home.     Follow Up Recommendations No PT follow up    Equipment Recommendations  None recommended by PT    Recommendations for Other Services       Precautions / Restrictions Precautions Precautions: Fall Precaution Comments: monitor sats, R posterior drain      Mobility  Bed Mobility Overal bed mobility: Needs Assistance Bed Mobility: Supine to Sit     Supine to sit: Min assist     General bed mobility comments: assist for trunk upright, assist for managing multiple lines/leads  Transfers Overall transfer level: Needs assistance Equipment used: Standard walker Transfers: Sit to/from Stand Sit to Stand: Min guard         General transfer comment: min/guard for safety  Ambulation/Gait Ambulation/Gait assistance: Min guard Gait Distance (Feet): 3 Feet Assistive device: Standard walker Gait Pattern/deviations: Step-through pattern;Decreased stride length     General Gait Details: min/guard for safety, SPO2 dropped to 86% on room air briefly however improved to 95% (pt requested to leave oxygen off)  Stairs            Wheelchair Mobility     Modified Rankin (Stroke Patients Only)       Balance                                             Pertinent Vitals/Pain Pain Assessment: Faces Faces Pain Scale: Hurts little more Pain Location: right drain area Pain Descriptors / Indicators: Sore Pain Intervention(s): Repositioned;Monitored during session;Limited activity within patient's tolerance    Home Living Family/patient expects to be discharged to:: Private residence Living Arrangements: Spouse/significant other   Type of Home: House Home Access: Level entry     Home Layout: One level Home Equipment: Environmental consultant - 2 wheels      Prior Function Level of Independence: Independent         Comments: reports oxygen as needed at home     Hand Dominance        Extremity/Trunk Assessment        Lower Extremity Assessment Lower Extremity Assessment: Generalized weakness    Cervical / Trunk Assessment Cervical / Trunk Assessment: Normal  Communication   Communication: No difficulties  Cognition Arousal/Alertness: Awake/alert Behavior During Therapy: Flat affect Overall Cognitive Status: Within Functional Limits for tasks assessed                                        General  Comments      Exercises     Assessment/Plan    PT Assessment Patient needs continued PT services  PT Problem List Decreased strength;Decreased mobility;Decreased knowledge of use of DME;Decreased activity tolerance       PT Treatment Interventions DME instruction;Therapeutic exercise;Gait training;Balance training;Functional mobility training;Therapeutic activities;Patient/family education    PT Goals (Current goals can be found in the Care Plan section)  Acute Rehab PT Goals PT Goal Formulation: With patient Time For Goal Achievement: 06/01/20 Potential to Achieve Goals: Good    Frequency Min 3X/week   Barriers to discharge        Co-evaluation               AM-PAC PT "6  Clicks" Mobility  Outcome Measure Help needed turning from your back to your side while in a flat bed without using bedrails?: A Little Help needed moving from lying on your back to sitting on the side of a flat bed without using bedrails?: A Little Help needed moving to and from a bed to a chair (including a wheelchair)?: A Little Help needed standing up from a chair using your arms ([name].g., wheelchair or bedside chair)?: A Little Help needed to walk in hospital room?: A Little Help needed climbing 3-5 steps with a railing? : A Lot 6 Click Score: 17    End of Session Equipment Utilized During Treatment: Gait belt Activity Tolerance: Patient tolerated treatment well Patient left: in chair;with call bell/phone within reach;with chair alarm set;with family/visitor present Nurse Communication: Mobility status PT Visit Diagnosis: Difficulty in walking, not elsewhere classified (R26.2)    Time: 3716-9678 PT Time Calculation (min) (ACUTE ONLY): 20 min   Charges:   PT Evaluation $PT Eval Moderate Complexity: 1 Mod     Kati PT, DPT Acute Rehabilitation Services Pager: [phone] Office: [phone]  Marco [name],Marco [name] 05/18/2020, 4:08 PM

## 2020-05-18 NOTE — Progress Notes (Signed)
PROGRESS NOTE    WALT GEATHERS  CBJ:[id number] DOB: 05/10/1956 DOA: 05/12/2020 PCP: [name], MD    Brief Narrative:  64 yo AA male with PMH DMII, HTN, dCHF (EF most recently 50-55% in May, grade 1 DD), RA on biologic therapy.  Patient was admitted in April / May most recently. Had sepsis, hypotension at that time secondary to UTI with a R perinephric abscess (3.9 cm at that time). Drainage of perinephric abscess in late May grew out E.Coli sensitive to cefepime, rocephin.  Assessment & Plan:   Principal Problem:   Severe sepsis ([place]) Active Problems:   Elevated troponin   Atrial fibrillation (HCC)   Essential hypertension   Rheumatoid arthritis (HCC)   Type 2 diabetes mellitus with stage 3 chronic kidney disease, without long-term current use of insulin (HCC)   Chronic combined systolic and diastolic CHF (congestive heart failure) (HCC)   Acute renal failure superimposed on stage 3 chronic kidney disease (HCC)   Acute metabolic encephalopathy   Nephrolithiasis   Perinephric abscess   Pyohydronephrosis  Severe sepsis (HCC) -Presenting fever, tachycardia, tachypnea, leukocytosis, lactic acidosis, encephalopathy, source of infection considered urinary/perinephric abscess - Blood, urine, and perinephric abscess cultures growing E. Coli - abx was later de-escalated to rocephin monotherapy - PCT had been downtrending -Fevers improving. WBC remains stable at 17k  Perinephric abscess - right sided; seen on CT - s/p drain placement with IR on 10/3 - plan per above  -Repeat CT on 10/5 due to worsening abdominal pain and distension. CT findings notable for R sided retroperitoneal bleed with pigtail cath abutting and potentially within the the R renal cortex. Appreciate input by Urology and IR. - Continue drain for now, plan f/u imaging once drain output becomes minimal over 3-4 day period or if clinical status worsens -Overnight, hgb trended down to the 6 range,  receiving 2 units PRBC's. IR is following. Defer to IR regarding repeat imaging.   Acute renal failure superimposed on stage 3 chronic kidney disease ([place]) -Unclear etiology, but possible contribution from outlet obstruction versus severe sepsis vs cardiorenal syndrome related to systolic failure below -Patient may still need left-sided percutaneous nephrostomy tube -IVF held secondary to below acute chf exacerbation - Renal function improving with diuresis - Recheck bmet in AM  Elevated troponin -Patient has history of elevated troponins in setting of acute infections. This is considered due to demand. He has recurrent chest pain/shortness of breath which may also be due to ongoing fluid resuscitation from hypotension in setting of sepsis -Trend troponin but likely due to demand ischemia at this time -Cont to monitor for now  Nephrolithiasis -CT reveals a 4 mm left-sided ureteral calculus.  -IVF currently on hold secondary to below -Urology is following. If concern for worsening renal function or infection will need L PCN  Acute metabolic encephalopathy -Typically alert and oriented.  Encephalopathy considered in setting of underlying infection  Hyperkalemia-resolved as of 05/14/2020 - continue tele - Potassium low at 3.4, will replace -Repeat bmet in AM  Acute on Chronic combined systolic and diastolic CHF (congestive heart failure) ([place]) -Recent CXR ordered and reviewed. Finding suggestive of vascular pulm congestion -IVF held. Pt started on scheduled IV lasix with good results -2d echo reviewed, findings of EF of 25-30% -Chart reviewed. Pt is well known to heart failure service as outpatient and noted to have variable EF heart failure with EF of 25% in 2018, more recently 50% several months ago. Pt is off entresto secondary to hypotension  and off hydralazine. Pt is off Jardiance secondary to n/v.  -Work up was initially suggestive of TTR amyloidosis although later genetic  testing was neg. Repeat PYP on 1/21 was read as equivocal although Cardiology felt it to be negative -Discussed with Cardiology who agrees with diuresis as tolerated, agrees with titrating coreg as BP tolerates. Now on 25mg  bid coreg  Type 2 diabetes mellitus with stage 3 chronic kidney disease, without long-term current use of insulin (HCC) - continue SSI and CBG monitoring as needed -glycemic trends overall stable  Rheumatoid arthritis ([place]) -On outpatient treatment -Continuing to hold secukinumab given active infection -was started on steroids as patient is on chronic prednisone 5 mg daily at home as well  Essential hypertension -continue 25mg  bid coreg per above -cont to titrate bp meds to achieve normotension  Atrial fibrillation (HCC) -Cont on beta blocker per above -Aspirin on hold in case of need for further interventions  DVT prophylaxis: SCD's Code Status: Full Family Communication: Pt in room  Status is: Inpatient  Remains inpatient appropriate because:Hemodynamically unstable, Ongoing diagnostic testing needed not appropriate for outpatient work up, Unsafe d/c plan and IV treatments appropriate due to intensity of illness or inability to take PO   Dispo: The patient is from: Home              Anticipated d/c is to: Unknown at this time. Will need PT eval when more medically stable              Anticipated d/c date is: > 3 days              Patient currently is not medically stable to d/c.   Consultants:   Urology  IR  Procedures:   Right perinephric abscess drain placement, 05/13/20  Antimicrobials: Anti-infectives (From admission, onward)   Start     Dose/Rate Route Frequency Ordered Stop   05/13/20 1800  cefTRIAXone (ROCEPHIN) 2 g in sodium chloride 0.9 % 100 mL IVPB        2 g 200 mL/hr over 30 Minutes Intravenous Every 24 hours 05/13/20 1748     05/13/20 1630  metroNIDAZOLE (FLAGYL) IVPB 500 mg  Status:  Discontinued        500 mg 100 mL/hr over  60 Minutes Intravenous Every 8 hours 05/13/20 1618 05/17/20 1348   05/13/20 1300  ceFEPIme (MAXIPIME) 2 g in sodium chloride 0.9 % 100 mL IVPB  Status:  Discontinued        2 g 200 mL/hr over 30 Minutes Intravenous Every 12 hours 05/13/20 0353 05/13/20 1748   05/13/20 0400  ceFEPIme (MAXIPIME) 2 g in sodium chloride 0.9 % 100 mL IVPB  Status:  Discontinued        2 g 200 mL/hr over 30 Minutes Intravenous  Once 05/13/20 0347 05/13/20 0352   05/13/20 0015  vancomycin (VANCOREADY) IVPB 2000 mg/400 mL        2,000 mg 200 mL/hr over 120 Minutes Intravenous  Once 05/13/20 0004 05/13/20 0315   05/13/20 0000  ceFEPIme (MAXIPIME) 2 g in sodium chloride 0.9 % 100 mL IVPB        2 g 200 mL/hr over 30 Minutes Intravenous  Once 05/12/20 2356 05/13/20 0110   05/13/20 0000  metroNIDAZOLE (FLAGYL) IVPB 500 mg        500 mg 100 mL/hr over 60 Minutes Intravenous  Once 05/12/20 2356 05/13/20 0202   05/13/20 0000  vancomycin (VANCOCIN) IVPB 1000 mg/200 mL premix  Status:  Discontinued        1,000 mg 200 mL/hr over 60 Minutes Intravenous  Once 05/12/20 2356 05/13/20 0003      Subjective: Reports feeling better. Complains of continued flank pain  Objective: Vitals:   05/18/20 1217 05/18/20 1232 05/18/20 1300 05/18/20 1400  BP: (!) 151/73 (!) 146/67 (!) 152/71 (!) 154/79  Pulse: 75 75 74 73  Resp: 15 14 14 15   Temp: 99.5 F (37.5 C) 99.5 F (37.5 C) 99.3 F (37.4 C) 99.3 F (37.4 C)  TempSrc: Bladder     SpO2: 96%  98% 97%  Weight:      Height:        Intake/Output Summary (Last 24 hours) at 05/18/2020 1427 Last data filed at 05/18/2020 0930 Gross per 24 hour  Intake 431 ml  Output 1600 ml  Net -1169 ml   Filed Weights   05/13/20 0400 05/18/20 0600  Weight: 106.2 kg 108 kg    Examination: General exam: Conversant, in no acute distress Respiratory system: normal chest rise, clear, no audible wheezing Cardiovascular system: regular rhythm, s1-s2 Gastrointestinal system: Nondistended,  nontender, pos BS Central nervous system: No seizures, no tremors Extremities: No cyanosis, no joint deformities Skin: No rashes, no pallor Psychiatry: Affect normal // no auditory hallucinations   Data Reviewed: I have personally reviewed following labs and imaging studies  CBC: Recent Labs  Lab 05/15/20 1316 05/15/20 1316 05/15/20 1901 05/15/20 2342 05/16/20 0307 05/17/20 0251 05/18/20 0304  WBC 23.1*  --  21.1*  --  19.3* 17.6* 17.4*  NEUTROABS 19.7*  --  17.0*  --  15.1* 12.2* 12.4*  HGB 8.0*   < > 8.0* 7.6* 7.3* 7.2* 6.6*  HCT 24.8*   < > 25.8* 23.9* 23.3* 23.0* 21.3*  MCV 87.9  --  89.9  --  90.0 88.8 88.8  PLT 246  --  245  --  244 262 259   < > = values in this interval not displayed.   Basic Metabolic Panel: Recent Labs  Lab 05/14/20 0509 05/14/20 0509 05/15/20 0055 05/15/20 0055 05/15/20 0344 05/15/20 1316 05/16/20 0307 05/17/20 0251 05/18/20 0304  NA 139   < > 142   < > 140 143 140 146* 139  K 4.5   < > 3.7   < > 3.6 4.0 3.4* 3.7 3.4*  CL 108   < > 104   < > 104 105 104 107 102  CO2 23   < > 22   < > 22 22 25 27 25   GLUCOSE 154*   < > 230*   < > 296* 259* 161* 152* 183*  BUN 30*   < > 37*   < > 39* 41* 38* 33* 28*  CREATININE 1.80*   < > 2.03*   < > 2.06* 1.99* 1.83* 1.73* 1.62*  CALCIUM 7.6*   < > 8.6*   < > 8.3* 8.4* 8.0* 8.4* 7.8*  MG 2.0  --  2.3  --   --   --  2.1 2.2 2.0  PHOS 3.3  --  3.5  --   --   --  2.9 2.5 2.4*   < > = values in this interval not displayed.   GFR: Estimated Creatinine Clearance: 52.4 mL/min (A) (by C-G formula based on SCr of 1.62 mg/dL (H)). Liver Function Tests: Recent Labs  Lab 05/13/20 0054 05/13/20 0700 05/15/20 0344  AST 20 16 14*  ALT 23 19 17   ALKPHOS 59 45 47  BILITOT  1.2 1.2 0.8  PROT 7.7 6.6 7.1  ALBUMIN 2.8* 2.3* 2.3*   No results for input(s): LIPASE, AMYLASE in the last 168 hours. No results for input(s): AMMONIA in the last 168 hours. Coagulation Profile: Recent Labs  Lab 05/13/20 0054  05/13/20 0700  INR 1.3* 1.4*   Cardiac Enzymes: No results for input(s): CKTOTAL, CKMB, CKMBINDEX, TROPONINI in the last 168 hours. BNP (last 3 results) No results for input(s): PROBNP in the last 8760 hours. HbA1C: No results for input(s): HGBA1C in the last 72 hours. CBG: Recent Labs  Lab 05/17/20 1149 05/17/20 1649 05/17/20 2139 05/18/20 0721 05/18/20 1109  GLUCAP 269* 281* 196* 149* 161*   Lipid Profile: No results for input(s): CHOL, HDL, LDLCALC, TRIG, CHOLHDL, LDLDIRECT in the last 72 hours. Thyroid Function Tests: No results for input(s): TSH, T4TOTAL, FREET4, T3FREE, THYROIDAB in the last 72 hours. Anemia Panel: No results for input(s): VITAMINB12, FOLATE, FERRITIN, TIBC, IRON, RETICCTPCT in the last 72 hours. Sepsis Labs: Recent Labs  Lab 05/13/20 0340 05/13/20 0700 05/14/20 0509 05/15/20 0055 05/15/20 0344 05/15/20 1316 05/15/20 1901 05/15/20 2342 05/16/20 0307  PROCALCITON  --  15.16 11.37 7.46  --   --   --   --   --   LATICACIDVEN   < >  --   --  4.5*   < > 3.0* 2.9* 2.3* 2.1*   < > = values in this interval not displayed.    Recent Results (from the past 240 hour(s))  Blood Culture (routine x 2)     Status: Abnormal   Collection Time: 05/13/20 12:10 AM   Specimen: BLOOD  Result Value Ref Range Status   Specimen Description   Final    BLOOD RIGHT ANTECUBITAL Performed at [facility] [address] [place], [place] [address]    Special Requests   Final    BOTTLES DRAWN AEROBIC AND ANAEROBIC Blood Culture adequate volume Performed at [facility] [address] [place], [place] [address]    Culture  Setup Time   Final    GRAM NEGATIVE RODS IN BOTH AEROBIC AND ANAEROBIC BOTTLES Organism ID to follow CRITICAL RESULT CALLED TO, READ BACK BY AND VERIFIED WITH: Shelda Jakes PHARMD, AT 1704 05/13/20 BY Rush Landmark Performed at [facility], [place] [address] [place], [place] [address]    Culture  ESCHERICHIA COLI (A)  Final   Report Status 05/15/2020 FINAL  Final   Organism ID, Bacteria ESCHERICHIA COLI  Final      Susceptibility   Escherichia coli - MIC*    AMPICILLIN >=32 RESISTANT Resistant     CEFAZOLIN >=64 RESISTANT Resistant     CEFEPIME <=0.12 SENSITIVE Sensitive     CEFTAZIDIME <=1 SENSITIVE Sensitive     CEFTRIAXONE 1 SENSITIVE Sensitive     CIPROFLOXACIN <=0.25 SENSITIVE Sensitive     GENTAMICIN <=1 SENSITIVE Sensitive     IMIPENEM <=0.25 SENSITIVE Sensitive     TRIMETH/SULFA <=20 SENSITIVE Sensitive     AMPICILLIN/SULBACTAM >=32 RESISTANT Resistant     PIP/TAZO 8 SENSITIVE Sensitive     * ESCHERICHIA COLI  Blood Culture ID Panel (Reflexed)     Status: Abnormal   Collection Time: 05/13/20 12:10 AM  Result Value Ref Range Status   Enterococcus faecalis NOT DETECTED NOT DETECTED Final   Enterococcus Faecium NOT DETECTED NOT DETECTED Final   Listeria monocytogenes NOT DETECTED NOT DETECTED Final   Staphylococcus species NOT DETECTED NOT DETECTED Final   Staphylococcus  aureus (BCID) NOT DETECTED NOT DETECTED Final   Staphylococcus epidermidis NOT DETECTED NOT DETECTED Final   Staphylococcus lugdunensis NOT DETECTED NOT DETECTED Final   Streptococcus species NOT DETECTED NOT DETECTED Final   Streptococcus agalactiae NOT DETECTED NOT DETECTED Final   Streptococcus pneumoniae NOT DETECTED NOT DETECTED Final   Streptococcus pyogenes NOT DETECTED NOT DETECTED Final   A.calcoaceticus-baumannii NOT DETECTED NOT DETECTED Final   Bacteroides fragilis NOT DETECTED NOT DETECTED Final   Enterobacterales DETECTED (A) NOT DETECTED Final    Comment: Enterobacterales represent a large order of gram negative bacteria, not a single organism. CRITICAL RESULT CALLED TO, READ BACK BY AND VERIFIED WITH: Shelda Jakes PHARMD, AT 1704 05/13/20 BY D. VANHOOK    Enterobacter cloacae complex NOT DETECTED NOT DETECTED Final   Escherichia coli DETECTED (A) NOT DETECTED Final    Comment:  CRITICAL RESULT CALLED TO, READ BACK BY AND VERIFIED WITH: Shelda Jakes PHARMD, AT 1704 05/13/20 BY D. VANHOOK    Klebsiella aerogenes NOT DETECTED NOT DETECTED Final   Klebsiella oxytoca NOT DETECTED NOT DETECTED Final   Klebsiella pneumoniae NOT DETECTED NOT DETECTED Final   Proteus species NOT DETECTED NOT DETECTED Final   Salmonella species NOT DETECTED NOT DETECTED Final   Serratia marcescens NOT DETECTED NOT DETECTED Final   Haemophilus influenzae NOT DETECTED NOT DETECTED Final   Neisseria meningitidis NOT DETECTED NOT DETECTED Final   Pseudomonas aeruginosa NOT DETECTED NOT DETECTED Final   Stenotrophomonas maltophilia NOT DETECTED NOT DETECTED Final   Candida albicans NOT DETECTED NOT DETECTED Final   Candida auris NOT DETECTED NOT DETECTED Final   Candida glabrata NOT DETECTED NOT DETECTED Final   Candida krusei NOT DETECTED NOT DETECTED Final   Candida parapsilosis NOT DETECTED NOT DETECTED Final   Candida tropicalis NOT DETECTED NOT DETECTED Final   Cryptococcus neoformans/gattii NOT DETECTED NOT DETECTED Final   CTX-M ESBL NOT DETECTED NOT DETECTED Final   Carbapenem resistance IMP NOT DETECTED NOT DETECTED Final   Carbapenem resistance KPC NOT DETECTED NOT DETECTED Final   Carbapenem resistance NDM NOT DETECTED NOT DETECTED Final   Carbapenem resist OXA 48 LIKE NOT DETECTED NOT DETECTED Final   Carbapenem resistance VIM NOT DETECTED NOT DETECTED Final    Comment: Performed at [facility], [address] [place], [place] [address]  Blood Culture (routine x 2)     Status: None   Collection Time: 05/13/20 12:54 AM   Specimen: BLOOD LEFT FOREARM  Result Value Ref Range Status   Specimen Description   Final    BLOOD LEFT FOREARM Performed at [facility] [address], [place], [place] [address]    Special Requests   Final    BOTTLES DRAWN AEROBIC AND ANAEROBIC Blood Culture results may not be optimal due to an excessive volume of blood received  in culture bottles Performed at [facility] [address], [place], [place] [address]    Culture   Final    NO GROWTH 5 DAYS Performed at [facility], [place] [address], [place],  [address]    Report Status 05/18/2020 FINAL  Final  Urine culture     Status: Abnormal   Collection Time: 05/13/20 12:54 AM   Specimen: In/Out Cath Urine  Result Value Ref Range Status   Specimen Description   Final    IN/OUT CATH URINE Performed at [facility] [address], [place],  [address]    Special Requests  Final    NONE Performed at [facility], [place] [address] [place], [place] [address]    Culture >=100,000 COLONIES/mL ESCHERICHIA COLI (A)  Final   Report Status 05/15/2020 FINAL  Final   Organism ID, Bacteria ESCHERICHIA COLI (A)  Final      Susceptibility   Escherichia coli - MIC*    AMPICILLIN >=32 RESISTANT Resistant     CEFAZOLIN 8 SENSITIVE Sensitive     CEFTRIAXONE 0.5 SENSITIVE Sensitive     CIPROFLOXACIN <=0.25 SENSITIVE Sensitive     GENTAMICIN <=1 SENSITIVE Sensitive     IMIPENEM <=0.25 SENSITIVE Sensitive     NITROFURANTOIN <=16 SENSITIVE Sensitive     TRIMETH/SULFA <=20 SENSITIVE Sensitive     AMPICILLIN/SULBACTAM >=32 RESISTANT Resistant     PIP/TAZO 8 SENSITIVE Sensitive     * >=100,000 COLONIES/mL ESCHERICHIA COLI  Respiratory Panel by RT PCR (Flu A&B, Covid) - Nasopharyngeal Swab     Status: None   Collection Time: 05/13/20  1:26 AM   Specimen: Nasopharyngeal Swab  Result Value Ref Range Status   SARS Coronavirus 2 by RT PCR NEGATIVE NEGATIVE Final    Comment: (NOTE) SARS-CoV-2 target nucleic acids are NOT DETECTED.  The SARS-CoV-2 RNA is generally detectable in upper respiratoy specimens during the acute phase of infection. The lowest concentration of SARS-CoV-2 viral copies this assay can detect is 131 copies/mL. A negative result does not preclude SARS-Cov-2 infection and should  not be used as the sole basis for treatment or other patient management decisions. A negative result may occur with  improper specimen collection/handling, submission of specimen other than nasopharyngeal swab, presence of viral mutation(s) within the areas targeted by this assay, and inadequate number of viral copies (<131 copies/mL). A negative result must be combined with clinical observations, patient history, and epidemiological information. The expected result is Negative.  Fact Sheet for Patients:  PinkCheek.be  Fact Sheet for Healthcare Providers:  GravelBags.it  This test is no t yet approved or cleared by the [place] FDA and  has been authorized for detection and/or diagnosis of SARS-CoV-2 by FDA under an Emergency Use Authorization (EUA). This EUA will remain  in effect (meaning this test can be used) for the duration of the COVID-19 declaration under Section 564(b)(1) of the Act, 21 U.S.C. section 360bbb-3(b)(1), unless the authorization is terminated or revoked sooner.     Influenza A by PCR NEGATIVE NEGATIVE Final   Influenza B by PCR NEGATIVE NEGATIVE Final    Comment: (NOTE) The Xpert Xpress SARS-CoV-2/FLU/RSV assay is intended as an aid in  the diagnosis of influenza from Nasopharyngeal swab specimens and  should not be used as a sole basis for treatment. Nasal washings and  aspirates are unacceptable for Xpert Xpress SARS-CoV-2/FLU/RSV  testing.  Fact Sheet for Patients: PinkCheek.be  Fact Sheet for Healthcare Providers: GravelBags.it  This test is not yet approved or cleared by the [place] FDA and  has been authorized for detection and/or diagnosis of SARS-CoV-2 by  FDA under an Emergency Use Authorization (EUA). This EUA will remain  in effect (meaning this test can be used) for the duration of the  Covid-19 declaration under  Section 564(b)(1) of the Act, 21  U.S.C. section 360bbb-3(b)(1), unless the authorization is  terminated or revoked. Performed at [facility], [place] [address], [place], [place] [address]   Aerobic/Anaerobic Culture (surgical/deep wound)     Status: None   Collection Time: 05/13/20  6:20 AM   Specimen: Abdomen;  Abscess  Result Value Ref Range Status   Specimen Description   Final    ABDOMEN Performed at [facility] [address] [place], [place] [address]    Special Requests   Final    NONE Performed at [facility], [place] [address] [place], [place] [address]    Gram Stain   Final    ABUNDANT WBC PRESENT, PREDOMINANTLY PMN MODERATE GRAM NEGATIVE RODS    Culture   Final    ABUNDANT ESCHERICHIA COLI NO ANAEROBES ISOLATED Performed at [facility], [place] [address], [place], [place] [address]    Report Status 05/18/2020 FINAL  Final   Organism ID, Bacteria ESCHERICHIA COLI  Final      Susceptibility   Escherichia coli - MIC*    AMPICILLIN >=32 RESISTANT Resistant     CEFAZOLIN 8 SENSITIVE Sensitive     CEFEPIME <=0.12 SENSITIVE Sensitive     CEFTAZIDIME <=1 SENSITIVE Sensitive     CEFTRIAXONE <=0.25 SENSITIVE Sensitive     CIPROFLOXACIN <=0.25 SENSITIVE Sensitive     GENTAMICIN <=1 SENSITIVE Sensitive     IMIPENEM <=0.25 SENSITIVE Sensitive     TRIMETH/SULFA <=20 SENSITIVE Sensitive     AMPICILLIN/SULBACTAM >=32 RESISTANT Resistant     PIP/TAZO 8 SENSITIVE Sensitive     * ABUNDANT ESCHERICHIA COLI  Culture, blood (routine x 2)     Status: None (Preliminary result)   Collection Time: 05/14/20  5:00 AM   Specimen: BLOOD  Result Value Ref Range Status   Specimen Description   Final    BLOOD RIGHT ANTECUBITAL Performed at [facility] [address] [place], [place] [address]    Special Requests   Final    BOTTLES DRAWN AEROBIC AND ANAEROBIC Blood Culture adequate volume Performed at  [facility] [address] [place], [place] [address]    Culture   Final    NO GROWTH 4 DAYS Performed at [facility], [place] [address] [place], [place] [address]    Report Status PENDING  Incomplete  Culture, blood (routine x 2)     Status: None (Preliminary result)   Collection Time: 05/14/20  5:05 AM   Specimen: BLOOD  Result Value Ref Range Status   Specimen Description   Final    BLOOD RIGHT ANTECUBITAL Performed at [facility] [address], [place], [place] [address]    Special Requests   Final    BOTTLES DRAWN AEROBIC AND ANAEROBIC Blood Culture adequate volume Performed at [facility] [address], [place], [place] [address]    Culture   Final    NO GROWTH 4 DAYS Performed at [facility], [place] [address] [place], [place] [address]    Report Status PENDING  Incomplete  MRSA PCR Screening     Status: Abnormal   Collection Time: 05/14/20  6:32 PM   Specimen: Nasopharyngeal  Result Value Ref Range Status   MRSA by PCR POSITIVE (A) NEGATIVE Final    Comment:        The GeneXpert MRSA Assay (FDA approved for NASAL specimens only), is one component of a comprehensive MRSA colonization surveillance program. It is not intended to diagnose MRSA infection nor to guide or monitor treatment for MRSA infections. RESULT CALLED TO, READ BACK BY AND VERIFIED WITH: Gavin Potters, K. RN @1028  ON 10.5.2021 BY [facility] Performed at [facility], [place] [address], [place], [place] [address]  Radiology Studies: DG CHEST PORT 1 VIEW  Result Date: 05/17/2020 CLINICAL DATA:  Recent aspiration EXAM: PORTABLE CHEST 1 VIEW COMPARISON:  05/16/2020 FINDINGS: Cardiac shadow is enlarged but stable. Tortuous thoracic aorta is seen. Mild thickening of the minor fissure is noted consistent with small right-sided pleural effusion. Drainage catheter is noted in the right upper quadrant. Previously  seen bibasilar opacities have improved in the interval from the prior exam. IMPRESSION: Improved aeration in the bases bilaterally. No new focal abnormality is noted. Electronically Signed   By: Inez Catalina M.D.   On: 05/17/2020 20:12    Scheduled Meds: . carvedilol  25 mg Oral BID WC  . Chlorhexidine Gluconate Cloth  6 each Topical Daily  . furosemide  40 mg Intravenous Q12H  . insulin aspart  0-9 Units Subcutaneous TID AC & HS  . mupirocin ointment  1 application Nasal BID  . predniSONE  5 mg Oral Q breakfast  . sodium chloride flush  5 mL Intracatheter Q8H   Continuous Infusions: . cefTRIAXone (ROCEPHIN)  IV Stopped (05/17/20 1912)     LOS: 5 days   Marylu Lund, MD [facility] Pager On Amion  If 7PM-7AM, please contact night-coverage 05/18/2020, 2:27 PM

## 2020-05-18 NOTE — Progress Notes (Signed)
Referring Physician(s): Dr. Claudia Desanctis  Supervising Physician: Dr. Serafina Royals   Patient Status:  [facility] - In-pt  Chief Complaint: AMS and Sepsis secondary to persistent perinephric abscess. S/p right perinephric abscess drain 05/13/20 by Dr. Earleen Newport  Subjective: Patient awake and alert in bed, watching TV. He states he feels better but still has some abdominal discomfort.   Allergies: Nsaids and Jardiance [empagliflozin]  Medications: Prior to Admission medications   Medication Sig Start Date End Date Taking? Authorizing Provider  albuterol (PROVENTIL HFA;VENTOLIN HFA) 108 (90 Base) MCG/ACT inhaler Inhale 2 puffs into the lungs every 6 (six) hours as needed for wheezing or shortness of breath. 04/29/18  Yes [name], MD  allopurinol (ZYLOPRIM) 100 MG tablet Take 200 mg by mouth every evening.    Yes [provider]  aspirin (EQ ASPIRIN ADULT LOW DOSE) 81 MG EC tablet TAKE 1 TABLET BY MOUTH ONCE DAILY. SWALLOW WHOLE Patient taking differently: Take 81 mg by mouth daily. TAKE 1 TABLET BY MOUTH ONCE DAILY. SWALLOW WHOLE 02/29/20  Yes [name], MD  atorvastatin (LIPITOR) 40 MG tablet Take 1 tablet (40 mg total) by mouth daily. 09/27/19  Yes Sueanne Margarita, MD  carvedilol (COREG) 12.5 MG tablet Take 1 tablet (12.5 mg total) by mouth 2 (two) times daily with a meal. 09/12/19  Yes Larey Dresser, MD  cyclobenzaprine (FLEXERIL) 5 MG tablet Take 5 mg by mouth every 12 (twelve) hours. 04/05/20  Yes [provider]  diclofenac Sodium (VOLTAREN) 1 % GEL Apply 4 g topically 4 (four) times daily. Patient taking differently: Apply 4 g topically 4 (four) times daily as needed (pain).  10/23/19  Yes Darr, Marguerita Beards, [name]  DULoxetine (CYMBALTA) 20 MG capsule TAKE 1 CAPSULE BY MOUTH ONCE DAILY Patient taking differently: Take 20 mg by mouth daily.  03/27/20  Yes [name], MD  famotidine (PEPCID) 20 MG tablet TAKE 1 TABLET BY MOUTH TWO TIMES DAILY Patient taking differently:  Take 20 mg by mouth 2 (two) times daily.  04/18/20  Yes [name], MD  ferrous sulfate 325 (65 FE) MG tablet Take 1 tablet (325 mg total) by mouth 2 (two) times daily with a meal. 01/14/20  Yes Lavina Hamman, MD  furosemide (LASIX) 80 MG tablet Take 80 mg by mouth daily.    Yes [provider]  Insulin Lispro Prot & Lispro (HUMALOG MIX 75/25 KWIKPEN) (75-25) 100 UNIT/ML Kwikpen Inject 100 Units into the skin daily with breakfast. 12/20/19  Yes Renato Shin, MD  isosorbide mononitrate (IMDUR) 30 MG 24 hr tablet Take 30 mg by mouth daily. 01/19/20  Yes [provider]  naloxone [facility]) nasal spray 4 mg/0.1 mL Place in nostril in the event of suspected overdose on Narcotic medication (Oxycodone) 02/16/20  Yes [name], MD  oxyCODONE (OXY IR/ROXICODONE) 5 MG immediate release tablet Take 5 mg by mouth 3 (three) times daily as needed for moderate pain.  01/24/20  Yes [provider]  polyethylene glycol (MIRALAX / GLYCOLAX) 17 g packet Take 17 g by mouth daily as needed for moderate constipation or severe constipation. 01/14/20  Yes Lavina Hamman, MD  Potassium Chloride ER 20 MEQ TBCR Take 20 mEq by mouth daily.  01/15/20  Yes [provider]  predniSONE (DELTASONE) 5 MG tablet Take 5 mg by mouth daily. 11/23/19  Yes [provider]  pregabalin (LYRICA) 100 MG capsule Take 100 mg by mouth 3 (three) times daily.  Yes [provider]  ramelteon (ROZEREM) 8 MG tablet TAKE 1 TABLET BY MOUTH AT BEDTIME Patient taking differently: Take 8 mg by mouth at bedtime.  04/18/20  Yes [name], MD  Secukinumab (COSENTYX) 150 MG/ML SOSY Inject 150 mg into the skin every 28 (twenty-eight) days. Hold until told to resume by PCP 01/14/20  Yes Lavina Hamman, MD  Tafamidis [facility]) 61 MG CAPS Take 61 mg by mouth daily. 09/08/19  Yes Bensimhon, Shaune Pascal, MD  Accu-Chek FastClix Lancets MISC 1 each by Other route 2 (two) times daily. E11.42 03/27/20    Renato Shin, MD  ACCU-CHEK GUIDE test strip USE 1 STRIP TWICE DAILY TO CHECK BLOOD GLUCOSE 02/13/20   Renato Shin, MD  Insulin Pen Needle 32G X 4 MM MISC 1 each by Does not apply route daily. E11.9 03/06/20   Renato Shin, MD  sacubitril-valsartan (ENTRESTO) 24-26 MG Take 1 tablet by mouth 2 (two) times daily. 05/11/20   Bensimhon, Shaune Pascal, MD     Vital Signs: BP (!) 146/67   Pulse 75   Temp 99.5 F (37.5 C)   Resp 14   Ht 5\' 6"  (1.676 m)   Wt 238 lb 1.6 oz (108 kg)   SpO2 96%   BMI 38.43 kg/m   Physical Exam Constitutional:      General: He is not in acute distress. Cardiovascular:     Rate and Rhythm: Normal rate and regular rhythm.  Pulmonary:     Effort: Pulmonary effort is normal.  Abdominal:     General: There is distension.     Tenderness: There is abdominal tenderness.  Skin:    General: Skin is warm and dry.  Neurological:     Mental Status: He is alert and oriented to person, place, and time.     Imaging: DG CHEST PORT 1 VIEW  Result Date: 05/17/2020 CLINICAL DATA:  Recent aspiration EXAM: PORTABLE CHEST 1 VIEW COMPARISON:  05/16/2020 FINDINGS: Cardiac shadow is enlarged but stable. Tortuous thoracic aorta is seen. Mild thickening of the minor fissure is noted consistent with small right-sided pleural effusion. Drainage catheter is noted in the right upper quadrant. Previously seen bibasilar opacities have improved in the interval from the prior exam. IMPRESSION: Improved aeration in the bases bilaterally. No new focal abnormality is noted. Electronically Signed   By: Inez Catalina M.D.   On: 05/17/2020 20:12   DG CHEST PORT 1 VIEW  Result Date: 05/16/2020 CLINICAL DATA:  Hypoxia. EXAM: PORTABLE CHEST 1 VIEW COMPARISON:  05/13/2020 FINDINGS: 0958 hours. Low volume rotated film. The cardio pericardial silhouette is enlarged. There is pulmonary vascular congestion without overt pulmonary edema. Patchy airspace opacity at the lung bases compatible with atelectasis  or infiltrate. No substantial pleural effusion. The visualized bony structures of the thorax show no acute abnormality. Telemetry leads overlie the chest. IMPRESSION: 1. Cardiomegaly with vascular congestion. 2. Bibasilar atelectasis or infiltrate. Electronically Signed   By: Misty Stanley M.D.   On: 05/16/2020 10:15   ECHOCARDIOGRAM COMPLETE  Result Date: 05/16/2020    ECHOCARDIOGRAM REPORT   Patient Name:   Marco [name] Date of Exam: 05/16/2020 Medical Rec #:  [id number]       Height:       66.0 in Accession #:    [id number]      Weight:       234.1 lb Date of Birth:  09/28/1955       BSA:  2.138 m Patient Age:    51 years        BP:           175/82 mmHg Patient Gender: M               HR:           96 bpm. Exam Location:  Inpatient Procedure: 2D Echo and Intracardiac Opacification Agent Indications:    I31.3 Pericardial effusion (noninflammatory)  History:        Patient has prior history of Echocardiogram examinations, most                 recent 12/31/2019. Abnormal ECG, Arrythmias:Atrial Fibrillation,                 Signs/Symptoms:Dyspnea, Chest Pain, Altered Mental Status,                 Bacteremia and Fever; Risk Factors:Hypertension, Diabetes and                 Sleep Apnea.  Sonographer:    Roseanna Rainbow RDCS Referring Phys: [place]  Sonographer Comments: Technically difficult study due to poor echo windows and patient is morbidly obese. Image acquisition challenging due to patient body habitus. Patient in high fowler's position. IMPRESSIONS  1. Diffuse hypokinesis with abnormal septal motion . Left ventricular ejection fraction, by estimation, is 25 to 30%. The left ventricle has severely decreased function. The left ventricle demonstrates global hypokinesis. The left ventricular internal cavity size was moderately to severely dilated. There is moderate asymmetric left ventricular hypertrophy of the basal and septal segments. Left ventricular diastolic parameters were normal.  2.  Right ventricular systolic function is normal. The right ventricular size is normal.  3. The pericardial effusion is lateral to the left ventricle.  4. The mitral valve is normal in structure. Trivial mitral valve regurgitation. No evidence of mitral stenosis.  5. The aortic valve is normal in structure. Aortic valve regurgitation is not visualized. No aortic stenosis is present.  6. The inferior vena cava is normal in size with greater than 50% respiratory variability, suggesting right atrial pressure of 3 mmHg. FINDINGS  Left Ventricle: Diffuse hypokinesis with abnormal septal motion. Left ventricular ejection fraction, by estimation, is 25 to 30%. The left ventricle has severely decreased function. The left ventricle demonstrates global hypokinesis. Definity contrast agent was given IV to delineate the left ventricular endocardial borders. The left ventricular internal cavity size was moderately to severely dilated. There is moderate asymmetric left ventricular hypertrophy of the basal and septal segments. Left ventricular diastolic parameters were normal. Right Ventricle: The right ventricular size is normal. No increase in right ventricular wall thickness. Right ventricular systolic function is normal. Left Atrium: Left atrial size was normal in size. Right Atrium: Right atrial size was normal in size. Pericardium: Trivial pericardial effusion is present. The pericardial effusion is lateral to the left ventricle. Mitral Valve: The mitral valve is normal in structure. Trivial mitral valve regurgitation. No evidence of mitral valve stenosis. Tricuspid Valve: The tricuspid valve is normal in structure. Tricuspid valve regurgitation is mild . No evidence of tricuspid stenosis. Aortic Valve: The aortic valve is normal in structure. Aortic valve regurgitation is not visualized. No aortic stenosis is present. Pulmonic Valve: The pulmonic valve was normal in structure. Pulmonic valve regurgitation is not visualized. No  evidence of pulmonic stenosis. Aorta: The aortic root is normal in size and structure. Venous: The inferior vena cava is normal in  size with greater than 50% respiratory variability, suggesting right atrial pressure of 3 mmHg. IAS/Shunts: The interatrial septum was not well visualized.  LEFT VENTRICLE PLAX 2D LVIDd:         4.90 cm      Diastology LVIDs:         4.20 cm      LV e' medial:    4.90 cm/s LV PW:         1.80 cm      LV E/e' medial:  14.5 LV IVS:        1.60 cm      LV e' lateral:   7.18 cm/s LVOT diam:     2.40 cm      LV E/e' lateral: 9.9 LV SV:         70 LV SV Index:   33 LVOT Area:     4.52 cm  LV Volumes (MOD) LV vol d, MOD A2C: 145.0 ml LV vol d, MOD A4C: 152.0 ml LV vol s, MOD A2C: 89.8 ml LV vol s, MOD A4C: 101.0 ml LV SV MOD A2C:     55.2 ml LV SV MOD A4C:     152.0 ml LV SV MOD BP:      57.1 ml RIGHT VENTRICLE             IVC RV S prime:     17.90 cm/s  IVC diam: 1.50 cm TAPSE (M-mode): 2.5 cm LEFT ATRIUM           Index       RIGHT ATRIUM           Index LA diam:      4.20 cm 1.96 cm/m  RA Area:     11.50 cm LA Vol (A2C): 17.7 ml 8.28 ml/m  RA Volume:   22.70 ml  10.62 ml/m LA Vol (A4C): 40.9 ml 19.13 ml/m  AORTIC VALVE LVOT Vmax:   114.00 cm/s LVOT Vmean:  78.600 cm/s LVOT VTI:    0.154 m  AORTA Ao Root diam: 3.60 cm Ao Asc diam:  3.30 cm MITRAL VALVE MV Area (PHT): 3.31 cm    SHUNTS MV Decel Time: 229 msec    Systemic VTI:  0.15 m MV E velocity: 71.10 cm/s  Systemic Diam: 2.40 cm MV A velocity: 71.35 cm/s MV E/A ratio:  1.00 Jenkins Rouge MD Electronically signed by Jenkins Rouge MD Signature Date/Time: 05/16/2020/12:43:46 PM    Final    CT RENAL STONE STUDY  Addendum Date: 05/16/2020   ADDENDUM REPORT: 05/16/2020 09:04 ADDENDUM: There is an unchanged 5mm proximal left nephrolithiasis. Electronically Signed   By: Valentino Saxon MD   On: 05/16/2020 09:04   Result Date: 05/16/2020 CLINICAL DATA:  Sepsis EXAM: CT ABDOMEN AND PELVIS WITHOUT CONTRAST TECHNIQUE: Multidetector CT  imaging of the abdomen and pelvis was performed following the standard protocol without IV contrast. COMPARISON:  05/13/2020. FINDINGS: Lower chest: Small bilateral pleural effusions. Bibasilar consolidative opacities with centrilobular ground-glass nodularity of the RIGHT lower lobe. Hepatobiliary: Unchanged wedge-shaped hypodense lesions of the LEFT liver. Unchanged subcapsular lesion of the inferior RIGHT liver, better assessed on prior MRI. Small amount of fluid tracking along the liver, minimally increased. Excreted contrast versus gallstones within the gallbladder. Pancreas: No new focal fat stranding. Spleen: No new focal abnormality. Adrenals/Urinary Tract: Interval placement of a drain in the RIGHT perinephric abscess. There is new high density material in the Perirenal fossa which distorts the contours of the kidney. High density  material tracks in the retroperitoneum. There is a small amount of layering seen within this high density material which could reflect active bleeding. Unchanged hypodense lesion of the LEFT kidney. Adrenals are unremarkable. Bladder is partially decompressed around a Foley catheter. Small amount of intraluminal air consistent with intervention. Stomach/Bowel: No evidence of bowel obstruction. Diverticulosis without evidence of diverticulitis. Status post RIGHT colonic resection. Vascular/Lymphatic: Atherosclerotic calcifications of the aorta. No new suspicious lymph nodes. Reproductive: Coarse calcifications of the prostate. Other: Small fat containing inguinal hernias. Small amount of free fluid. Musculoskeletal: Remote LEFT rib fracture of the posterior twelfth rib. IMPRESSION: 1. Interval placement of a drain in the RIGHT perinephric abscess. There is new high density material in the perirenal fossa which distorts the kidney (Page kidney morphology). Blood products track in the retroperitoneum. There is a small amount of layering blood products seen which could reflect active  bleeding. Consider dedicated contrast enhanced CT to evaluate for active extravasation or pseudoaneurysm formation. 2. Small bilateral pleural effusions. 3. Bibasilar consolidative opacities with centrilobular ground-glass nodularity of the RIGHT lower lobe. Findings may reflect aspiration superimposed on atelectasis. Differential considerations include infection. These results were called by telephone at the time of interpretation on 05/15/2020 at 12:37 pm to provider DAVID GIRGUIS , who verbally acknowledged these results. Aortic Atherosclerosis (ICD10-I70.0). Electronically Signed: By: Valentino Saxon MD On: 05/15/2020 12:47   CT Angio Abd/Pel w/ and/or w/o  Result Date: 05/15/2020 CLINICAL DATA:  64 year old male with history of right perinephric fluid collection status post drainage on 05/13/2020 now with perinephric and retroperitoneal hemorrhage. Evaluate for active extravasation. EXAM: CT ANGIOGRAPHY ABDOMEN AND PELVIS WITH CONTRAST AND WITHOUT CONTRAST TECHNIQUE: Multidetector CT imaging of the abdomen and pelvis was performed using the standard protocol during bolus administration of intravenous contrast. Multiplanar reconstructed images and MIPs were obtained and reviewed to evaluate the vascular anatomy. CONTRAST:  90mL OMNIPAQUE IOHEXOL 350 MG/ML SOLN COMPARISON:  MRI abdomen from 03/22/2020, CT abdomen pelvis from 05/13/2020, CT-guided drain placement from 05/13/2020, and non contrasted CT abdomen pelvis from the same day. FINDINGS: VASCULAR Aorta: Scattered atherosclerotic calcifications without signficant stenosis, dissection, or aneurysm. Celiac: Patent without evidence of aneurysm, dissection, vasculitis or significant stenosis. SMA: Patent without evidence of aneurysm, dissection, vasculitis or significant stenosis. Renals: Single bilateral renal arteries are patent without evidence of aneurysm, dissection, vasculitis, fibromuscular dysplasia or significant stenosis. IMA: Patent without  evidence of aneurysm, dissection, vasculitis or significant stenosis. Inflow: Patent without evidence of aneurysm, dissection, vasculitis or significant stenosis. Proximal Outflow: Bilateral common femoral and visualized portions of the superficial and profunda femoral arteries are patent without evidence of aneurysm, dissection, vasculitis or significant stenosis. Veins: No obvious venous abnormality within the limitations of this arterial phase study. Review of the MIP images confirms the above findings. NON-VASCULAR Lower chest: Similar appearing small bilateral pleural effusions, right greater than left with associated passive atelectasis. Similar appearing global cardiomegaly with small, incompletely visualized pericardial effusion. Bilateral symmetric gynecomastia. Hepatobiliary: Similar appearing wedge like hypodense regions in the left lower lobe of the liver. Unchanged ill-defined right posterior decreased hepatic attenuation and wedge like hypodensity in the subcapsular regions of segment 5. The gallbladder is present unremarkable. No intra or extrahepatic biliary ductal dilation. Pancreas: Unremarkable. No pancreatic ductal dilatation or surrounding inflammatory changes. Spleen: Normal in size without focal abnormality. Adrenals/Urinary Tract: The adrenal glands are symmetric in size and enhancement bilaterally. Again seen is a prominent right perinephric hematoma measuring up to 9.4 x 5.3 cm in greatest axial dimension  about the lower pole. Similar position of the indwelling pigtail catheter which abuts and potentially is within the right renal cortex. There is a single focus of gas within the anterior and superior aspect of the perinephric hematoma. The hematoma extends into the right retroperitoneum, unchanged from comparison study from approximately 3 hours prior. No evidence of active extravasation. Unchanged simple cysts in the left kidney. No hydronephrosis or nephrolithiasis. Foley catheter is in  place within the bladder which is moderately distended. No bladder wall thickening. A doses Aleene Davidson now Stomach/Bowel: Stomach is within normal limits. Appendix appears normal. No evidence of bowel wall thickening, distention, or inflammatory changes. Postsurgical changes after right hemicolectomy. Sigmoid diverticula without surrounding inflammatory changes. Lymphatic: No abdominopelvic lymphadenopathy Reproductive: Prostate is unremarkable. Other: Unchanged appearance of previously visualized right perinephric and retroperitoneal fluid collection with heterogeneous attenuation, no evidence of active extravasation. Musculoskeletal: No acute or significant osseous findings. IMPRESSION: VASCULAR No acute vascular abnormality. No evidence of active extravasation in the right retroperitoneum. Aortic Atherosclerosis (ICD10-I70.0). NON-VASCULAR 1. Similar appearing right perinephric hematoma extending into the right retroperitoneum without active extravasation, likely related to positioning of the indwelling drainage catheter. 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 2. Similar appearing wedge like hypodensities in the left and right lobes of the liver, indeterminate but could be seen with infarction or abscess formation. Recommend attention on follow-up. 3. Similar appearing small bilateral pleural effusions with associated passive atelectasis. Ruthann Cancer, MD [facility] Electronically Signed   By: Ruthann Cancer MD   On: 05/15/2020 16:31    Labs:  CBC: Recent Labs    05/15/20 1901 05/15/20 1901 05/15/20 2342 05/16/20 0307 05/17/20 0251 05/18/20 0304  WBC 21.1*  --   --  19.3* 17.6* 17.4*  HGB 8.0*   < > 7.6* 7.3* 7.2* 6.6*  HCT 25.8*   < > 23.9* 23.3* 23.0* 21.3*  PLT 245  --   --  244 262 259   < > = values in this interval not displayed.    COAGS: Recent Labs    12/08/19 2014 12/30/19 1106  05/13/20 0054 05/13/20 0700  INR 1.3* 1.2 1.3* 1.4*  APTT 33  --  31  --     BMP: Recent Labs    05/13/20 0700 05/13/20 0700 05/14/20 0509 05/14/20 0509 05/15/20 0055 05/15/20 0055 05/15/20 0344 05/15/20 0344 05/15/20 1316 05/16/20 0307 05/17/20 0251 05/18/20 0304  NA 139   < > 139   < > 142   < > 140   < > 143 140 146* 139  K 5.4*   < > 4.5   < > 3.7   < > 3.6   < > 4.0 3.4* 3.7 3.4*  CL 104   < > 108   < > 104   < > 104   < > 105 104 107 102  CO2 23   < > 23   < > 22   < > 22   < > 22 25 27 25   GLUCOSE 162*   < > 154*   < > 230*   < > 296*   < > 259* 161* 152* 183*  BUN 31*   < > 30*   < > 37*   < > 39*   < > 41* 38* 33* 28*  CALCIUM 7.9*   < > 7.6*   < > 8.6*   < > 8.3*   < > 8.4* 8.0*  8.4* 7.8*  CREATININE 2.46*   < > 1.80*   < > 2.03*   < > 2.06*   < > 1.99* 1.83* 1.73* 1.62*  GFRNONAA 26*   < > 39*   < > 33*   < > 33*   < > 34* 38* 41* 44*  GFRAA 31*  --  45*  --  39*  --  38*  --   --   --   --   --    < > = values in this interval not displayed.    LIVER FUNCTION TESTS: Recent Labs    02/16/20 1218 05/13/20 0054 05/13/20 0700 05/15/20 0344  BILITOT 0.2 1.2 1.2 0.8  AST 9 20 16  14*  ALT 14 23 19 17   ALKPHOS 75 59 45 47  PROT 8.1 7.7 6.6 7.1  ALBUMIN 4.0 2.8* 2.3* 2.3*    Assessment and Plan:  Persistent perinephric abscess; s/p perinephric abscess drain 05/13/20: Hgb slightly down today at 6.6 and patient is receiving PRBCs. Previous CT from 05/15/20 with right retroperitoneal hematoma, no active bleeding on recent CTA. No output documented in the past few days. JP bulb had a minimal amount of dark blood. Do NOT flush drain. Continue to monitor output. Non-contrast CT will be ordered to re-evaluate.   IR will continue to follow. Other plans per primary teams.   Electronically Signed: Soyla Dryer, AGACNP-BC [phone] 05/18/2020, 1:57 PM   I spent a total of 15 Minutes at the the patient's bedside AND on the patient's hospital floor or unit,  greater than 50% of which was counseling/coordinating care for right perinephric abscess drain.

## 2020-05-18 NOTE — Progress Notes (Signed)
Urology Inpatient Progress Report      Intv/Subj: Patient getting 2 units of packed RBCs today for hemoglobin of 6.6.  He has more interactive today.  Principal Problem:   Severe sepsis ([place]) Active Problems:   Elevated troponin   Atrial fibrillation (HCC)   Essential hypertension   Rheumatoid arthritis ([place])   Type 2 diabetes mellitus with stage 3 chronic kidney disease, without long-term current use of insulin (HCC)   Chronic combined systolic and diastolic CHF (congestive heart failure) (HCC)   Acute renal failure superimposed on stage 3 chronic kidney disease (HCC)   Acute metabolic encephalopathy   Nephrolithiasis   Perinephric abscess   Pyohydronephrosis  Current Facility-Administered Medications  Medication Dose Route Frequency Provider Last Rate Last Admin  . acetaminophen (TYLENOL) tablet 650 mg  650 mg Oral Q6H PRN Etta Quill, DO   650 mg at 05/17/20 0445   Or  . acetaminophen (TYLENOL) suppository 650 mg  650 mg Rectal Q6H PRN Etta Quill, DO      . carvedilol (COREG) tablet 25 mg  25 mg Oral BID WC Donne Hazel, MD   25 mg at 05/18/20 0749  . cefTRIAXone (ROCEPHIN) 2 g in sodium chloride 0.9 % 100 mL IVPB  2 g Intravenous Q24H Dwyane Dee, MD   Stopped at 05/17/20 1912  . Chlorhexidine Gluconate Cloth 2 % PADS 6 each  6 each Topical Daily Dwyane Dee, MD   6 each at 05/18/20 0920  . chlorproMAZINE (THORAZINE) tablet 25 mg  25 mg Oral QID PRN Donne Hazel, MD      . furosemide (LASIX) injection 40 mg  40 mg Intravenous Q12H Donne Hazel, MD   40 mg at 05/18/20 0917  . hydrALAZINE (APRESOLINE) injection 5 mg  5 mg Intravenous Q4H PRN Donne Hazel, MD   5 mg at 05/16/20 2024  . insulin aspart (novoLOG) injection 0-9 Units  0-9 Units Subcutaneous TID AC & HS Dwyane Dee, MD   1 Units at 05/18/20 0749  . ipratropium-albuterol (DUONEB) 0.5-2.5 (3) MG/3ML nebulizer solution 3 mL  3 mL Nebulization Q6H PRN Donne Hazel, MD      . labetalol  (NORMODYNE) injection 10 mg  10 mg Intravenous Q4H PRN Dwyane Dee, MD   10 mg at 05/16/20 0830  . mupirocin ointment (BACTROBAN) 2 % 1 application  1 application Nasal BID Dwyane Dee, MD   1 application at 78/29/56 0920  . ondansetron (ZOFRAN) tablet 4 mg  4 mg Oral Q6H PRN Etta Quill, DO       Or  . ondansetron [facility]) injection 4 mg  4 mg Intravenous Q6H PRN Etta Quill, DO      . oxyCODONE (Oxy IR/ROXICODONE) immediate release tablet 5 mg  5 mg Oral Q3H PRN Lang Snow, FNP   5 mg at 05/18/20 2130  . polyethylene glycol (MIRALAX / GLYCOLAX) packet 17 g  17 g Oral Daily PRN Donne Hazel, MD   17 g at 05/17/20 1614  . polyvinyl alcohol (LIQUIFILM TEARS) 1.4 % ophthalmic solution 1 drop  1 drop Both Eyes PRN Dwyane Dee, MD   1 drop at 05/18/20 0605  . predniSONE (DELTASONE) tablet 5 mg  5 mg Oral Q breakfast Dwyane Dee, MD   5 mg at 05/18/20 0749  . sodium chloride flush (NS) 0.9 % injection 5 mL  5 mL Intracatheter Q8H Corrie Mckusick, DO   5 mL at 05/18/20 [phone]  Objective: Vital: Vitals:   05/18/20 0700 05/18/20 0715 05/18/20 0900 05/18/20 0930  BP: (!) 162/84 (!) 162/84 137/71 (!) 166/64  Pulse: 79 78 72 75  Resp: 12 13 15 19   Temp: 99.9 F (37.7 C) 99.9 F (37.7 C) 99.9 F (37.7 C) 99.7 F (37.6 C)  TempSrc: Bladder     SpO2: 98% 97% 96% 96%  Weight:      Height:       I/Os: I/O last 3 completed shifts: In: 100 [IV Piggyback:100] Out: 1600 [Urine:1600]  Physical Exam:  General: Patient is in no apparent distress Lungs: Normal respiratory effort, chest expands symmetrically. GI: The abdomen is soft, distended, mildly tender to palpation right upper quadrant R perinephric drain: Scant bloody drainage Foley: draining clear yellow urine  Lab Results: Recent Labs    05/16/20 0307 05/17/20 0251 05/18/20 0304  WBC 19.3* 17.6* 17.4*  HGB 7.3* 7.2* 6.6*  HCT 23.3* 23.0* 21.3*   Recent Labs    05/16/20 0307 05/17/20 0251  05/18/20 0304  NA 140 146* 139  K 3.4* 3.7 3.4*  CL 104 107 102  CO2 25 27 25   GLUCOSE 161* 152* 183*  BUN 38* 33* 28*  CREATININE 1.83* 1.73* 1.62*  CALCIUM 8.0* 8.4* 7.8*   No results for input(s): LABPT, INR in the last 72 hours. No results for input(s): LABURIN in the last 72 hours. Results for orders placed or performed during the hospital encounter of 05/12/20  Blood Culture (routine x 2)     Status: Abnormal   Collection Time: 05/13/20 12:10 AM   Specimen: BLOOD  Result Value Ref Range Status   Specimen Description   Final    BLOOD RIGHT ANTECUBITAL Performed at [facility] [address] [place], [place] [address]    Special Requests   Final    BOTTLES DRAWN AEROBIC AND ANAEROBIC Blood Culture adequate volume Performed at [place] [address] [place], [place] [address]    Culture  Setup Time   Final    GRAM NEGATIVE RODS IN BOTH AEROBIC AND ANAEROBIC BOTTLES Organism ID to follow CRITICAL RESULT CALLED TO, READ BACK BY AND VERIFIED WITH: Shelda Jakes PHARMD, AT 1704 05/13/20 BY Rush Landmark Performed at [facility], [place] [address] [place], [place] [address]    Culture ESCHERICHIA COLI (A)  Final   Report Status 05/15/2020 FINAL  Final   Organism ID, Bacteria ESCHERICHIA COLI  Final      Susceptibility   Escherichia coli - MIC*    AMPICILLIN >=32 RESISTANT Resistant     CEFAZOLIN >=64 RESISTANT Resistant     CEFEPIME <=0.12 SENSITIVE Sensitive     CEFTAZIDIME <=1 SENSITIVE Sensitive     CEFTRIAXONE 1 SENSITIVE Sensitive     CIPROFLOXACIN <=0.25 SENSITIVE Sensitive     GENTAMICIN <=1 SENSITIVE Sensitive     IMIPENEM <=0.25 SENSITIVE Sensitive     TRIMETH/SULFA <=20 SENSITIVE Sensitive     AMPICILLIN/SULBACTAM >=32 RESISTANT Resistant     PIP/TAZO 8 SENSITIVE Sensitive     * ESCHERICHIA COLI  Blood Culture ID Panel (Reflexed)     Status: Abnormal   Collection Time: 05/13/20 12:10 AM  Result Value Ref Range  Status   Enterococcus faecalis NOT DETECTED NOT DETECTED Final   Enterococcus Faecium NOT DETECTED NOT DETECTED Final   Listeria monocytogenes NOT DETECTED NOT DETECTED Final   Staphylococcus species NOT DETECTED NOT DETECTED Final   Staphylococcus aureus (BCID) NOT DETECTED NOT DETECTED  Final   Staphylococcus epidermidis NOT DETECTED NOT DETECTED Final   Staphylococcus lugdunensis NOT DETECTED NOT DETECTED Final   Streptococcus species NOT DETECTED NOT DETECTED Final   Streptococcus agalactiae NOT DETECTED NOT DETECTED Final   Streptococcus pneumoniae NOT DETECTED NOT DETECTED Final   Streptococcus pyogenes NOT DETECTED NOT DETECTED Final   A.calcoaceticus-baumannii NOT DETECTED NOT DETECTED Final   Bacteroides fragilis NOT DETECTED NOT DETECTED Final   Enterobacterales DETECTED (A) NOT DETECTED Final    Comment: Enterobacterales represent a large order of gram negative bacteria, not a single organism. CRITICAL RESULT CALLED TO, READ BACK BY AND VERIFIED WITH: Shelda Jakes PHARMD, AT 1704 05/13/20 BY D. VANHOOK    Enterobacter cloacae complex NOT DETECTED NOT DETECTED Final   Escherichia coli DETECTED (A) NOT DETECTED Final    Comment: CRITICAL RESULT CALLED TO, READ BACK BY AND VERIFIED WITH: Shelda Jakes PHARMD, AT 1704 05/13/20 BY D. VANHOOK    Klebsiella aerogenes NOT DETECTED NOT DETECTED Final   Klebsiella oxytoca NOT DETECTED NOT DETECTED Final   Klebsiella pneumoniae NOT DETECTED NOT DETECTED Final   Proteus species NOT DETECTED NOT DETECTED Final   Salmonella species NOT DETECTED NOT DETECTED Final   Serratia marcescens NOT DETECTED NOT DETECTED Final   Haemophilus influenzae NOT DETECTED NOT DETECTED Final   Neisseria meningitidis NOT DETECTED NOT DETECTED Final   Pseudomonas aeruginosa NOT DETECTED NOT DETECTED Final   Stenotrophomonas maltophilia NOT DETECTED NOT DETECTED Final   Candida albicans NOT DETECTED NOT DETECTED Final   Candida auris NOT DETECTED NOT DETECTED Final    Candida glabrata NOT DETECTED NOT DETECTED Final   Candida krusei NOT DETECTED NOT DETECTED Final   Candida parapsilosis NOT DETECTED NOT DETECTED Final   Candida tropicalis NOT DETECTED NOT DETECTED Final   Cryptococcus neoformans/gattii NOT DETECTED NOT DETECTED Final   CTX-M ESBL NOT DETECTED NOT DETECTED Final   Carbapenem resistance IMP NOT DETECTED NOT DETECTED Final   Carbapenem resistance KPC NOT DETECTED NOT DETECTED Final   Carbapenem resistance NDM NOT DETECTED NOT DETECTED Final   Carbapenem resist OXA 48 LIKE NOT DETECTED NOT DETECTED Final   Carbapenem resistance VIM NOT DETECTED NOT DETECTED Final    Comment: Performed at [facility], [address] [place], [place] [address]  Blood Culture (routine x 2)     Status: None   Collection Time: 05/13/20 12:54 AM   Specimen: BLOOD LEFT FOREARM  Result Value Ref Range Status   Specimen Description   Final    BLOOD LEFT FOREARM Performed at [place] [address], [place], [place] [address]    Special Requests   Final    BOTTLES DRAWN AEROBIC AND ANAEROBIC Blood Culture results may not be optimal due to an excessive volume of blood received in culture bottles Performed at [place] [address] [place], [place] [address]    Culture   Final    NO GROWTH 5 DAYS Performed at [facility], [place] [address] [place], [place] [address]    Report Status 05/18/2020 FINAL  Final  Urine culture     Status: Abnormal   Collection Time: 05/13/20 12:54 AM   Specimen: In/Out Cath Urine  Result Value Ref Range Status   Specimen Description   Final    IN/OUT CATH URINE Performed at [place] [address] [place], [place] [address]    Special Requests   Final    NONE Performed at  [facility], [place] [address], [place], [place] [address]    Culture >=100,000 COLONIES/mL ESCHERICHIA COLI (A)  Final   Report Status 05/15/2020  FINAL  Final   Organism ID, Bacteria ESCHERICHIA COLI (A)  Final      Susceptibility   Escherichia coli - MIC*    AMPICILLIN >=32 RESISTANT Resistant     CEFAZOLIN 8 SENSITIVE Sensitive     CEFTRIAXONE 0.5 SENSITIVE Sensitive     CIPROFLOXACIN <=0.25 SENSITIVE Sensitive     GENTAMICIN <=1 SENSITIVE Sensitive     IMIPENEM <=0.25 SENSITIVE Sensitive     NITROFURANTOIN <=16 SENSITIVE Sensitive     TRIMETH/SULFA <=20 SENSITIVE Sensitive     AMPICILLIN/SULBACTAM >=32 RESISTANT Resistant     PIP/TAZO 8 SENSITIVE Sensitive     * >=100,000 COLONIES/mL ESCHERICHIA COLI  Respiratory Panel by RT PCR (Flu A&B, Covid) - Nasopharyngeal Swab     Status: None   Collection Time: 05/13/20  1:26 AM   Specimen: Nasopharyngeal Swab  Result Value Ref Range Status   SARS Coronavirus 2 by RT PCR NEGATIVE NEGATIVE Final    Comment: (NOTE) SARS-CoV-2 target nucleic acids are NOT DETECTED.  The SARS-CoV-2 RNA is generally detectable in upper respiratoy specimens during the acute phase of infection. The lowest concentration of SARS-CoV-2 viral copies this assay can detect is 131 copies/mL. A negative result does not preclude SARS-Cov-2 infection and should not be used as the sole basis for treatment or other patient management decisions. A negative result may occur with  improper specimen collection/handling, submission of specimen other than nasopharyngeal swab, presence of viral mutation(s) within the areas targeted by this assay, and inadequate number of viral copies (<131 copies/mL). A negative result must be combined with clinical observations, patient history, and epidemiological information. The expected result is Negative.  Fact Sheet for Patients:  PinkCheek.be  Fact Sheet for Healthcare Providers:  GravelBags.it  This test is no t yet approved or cleared by the [place] FDA and  has been authorized for detection and/or diagnosis  of SARS-CoV-2 by FDA under an Emergency Use Authorization (EUA). This EUA will remain  in effect (meaning this test can be used) for the duration of the COVID-19 declaration under Section 564(b)(1) of the Act, 21 U.S.C. section 360bbb-3(b)(1), unless the authorization is terminated or revoked sooner.     Influenza A by PCR NEGATIVE NEGATIVE Final   Influenza B by PCR NEGATIVE NEGATIVE Final    Comment: (NOTE) The Xpert Xpress SARS-CoV-2/FLU/RSV assay is intended as an aid in  the diagnosis of influenza from Nasopharyngeal swab specimens and  should not be used as a sole basis for treatment. Nasal washings and  aspirates are unacceptable for Xpert Xpress SARS-CoV-2/FLU/RSV  testing.  Fact Sheet for Patients: PinkCheek.be  Fact Sheet for Healthcare Providers: GravelBags.it  This test is not yet approved or cleared by the [place] FDA and  has been authorized for detection and/or diagnosis of SARS-CoV-2 by  FDA under an Emergency Use Authorization (EUA). This EUA will remain  in effect (meaning this test can be used) for the duration of the  Covid-19 declaration under Section 564(b)(1) of the Act, 21  U.S.C. section 360bbb-3(b)(1), unless the authorization is  terminated or revoked. Performed at [facility], [place] [address], [place], [place] [address]   Aerobic/Anaerobic Culture (surgical/deep wound)     Status: None (Preliminary result)   Collection Time: 05/13/20  6:20 AM   Specimen: Abdomen; Abscess  Result Value Ref  Range Status   Specimen Description   Final    ABDOMEN Performed at [place] [address], [place], [place] [address]    Special Requests   Final    NONE Performed at [facility], [place] [address] [place], [place] [address]    Gram Stain   Final    ABUNDANT WBC PRESENT, PREDOMINANTLY PMN MODERATE GRAM NEGATIVE RODS Performed at  [facility], [place] [address] [place] [address] [place], [place] [address]    Culture   Final    ABUNDANT ESCHERICHIA COLI NO ANAEROBES ISOLATED; CULTURE IN PROGRESS FOR 5 DAYS    Report Status PENDING  Incomplete   Organism ID, Bacteria ESCHERICHIA COLI  Final      Susceptibility   Escherichia coli - MIC*    AMPICILLIN >=32 RESISTANT Resistant     CEFAZOLIN 8 SENSITIVE Sensitive     CEFEPIME <=0.12 SENSITIVE Sensitive     CEFTAZIDIME <=1 SENSITIVE Sensitive     CEFTRIAXONE <=0.25 SENSITIVE Sensitive     CIPROFLOXACIN <=0.25 SENSITIVE Sensitive     GENTAMICIN <=1 SENSITIVE Sensitive     IMIPENEM <=0.25 SENSITIVE Sensitive     TRIMETH/SULFA <=20 SENSITIVE Sensitive     AMPICILLIN/SULBACTAM >=32 RESISTANT Resistant     PIP/TAZO 8 SENSITIVE Sensitive     * ABUNDANT ESCHERICHIA COLI  Culture, blood (routine x 2)     Status: None (Preliminary result)   Collection Time: 05/14/20  5:00 AM   Specimen: BLOOD  Result Value Ref Range Status   Specimen Description   Final    BLOOD RIGHT ANTECUBITAL Performed at [facility] [address], [place], [place] [address]    Special Requests   Final    BOTTLES DRAWN AEROBIC AND ANAEROBIC Blood Culture adequate volume Performed at [facility] [address] [place], [place] [address]    Culture   Final    NO GROWTH 4 DAYS Performed at [facility], [place] [address], [place], [place] [address]    Report Status PENDING  Incomplete  Culture, blood (routine x 2)     Status: None (Preliminary result)   Collection Time: 05/14/20  5:05 AM   Specimen: BLOOD  Result Value Ref Range Status   Specimen Description   Final    BLOOD RIGHT ANTECUBITAL Performed at [facility] [place] [address] [place], [place] [address]    Special Requests   Final    BOTTLES DRAWN AEROBIC AND ANAEROBIC Blood Culture adequate volume Performed at [facility] [address] [place], [place]  [address]    Culture   Final    NO GROWTH 4 DAYS Performed at [facility], [place] [address] [place], [place] [address]    Report Status PENDING  Incomplete  MRSA PCR Screening     Status: Abnormal   Collection Time: 05/14/20  6:32 PM   Specimen: Nasopharyngeal  Result Value Ref Range Status   MRSA by PCR POSITIVE (A) NEGATIVE Final    Comment:        The GeneXpert MRSA Assay (FDA approved for NASAL specimens only), is one component of a comprehensive MRSA colonization surveillance program. It is not intended to diagnose MRSA infection nor to guide or monitor treatment for MRSA infections. RESULT CALLED TO, READ BACK BY AND VERIFIED WITH: Gavin Potters, K. RN @1028  ON 10.5.2021 BY [facility] Performed at [facility], [place] [address], [place], [place] [address]  Studies/Results: DG CHEST PORT 1 VIEW  Result Date: 05/17/2020 CLINICAL DATA:  Recent aspiration EXAM: PORTABLE CHEST 1 VIEW COMPARISON:  05/16/2020 FINDINGS: Cardiac shadow is enlarged but stable. Tortuous thoracic aorta is seen. Mild thickening of the minor fissure is noted consistent with small right-sided pleural effusion. Drainage catheter is noted in the right upper quadrant. Previously seen bibasilar opacities have improved in the interval from the prior exam. IMPRESSION: Improved aeration in the bases bilaterally. No new focal abnormality is noted. Electronically Signed   By: Inez Catalina M.D.   On: 05/17/2020 20:12   ECHOCARDIOGRAM COMPLETE  Result Date: 05/16/2020    ECHOCARDIOGRAM REPORT   Patient Name:   Marco [name] Date of Exam: 05/16/2020 Medical Rec #:  [id number]       Height:       66.0 in Accession #:    [id number]      Weight:       234.1 lb Date of Birth:  June 02, 1956       BSA:          2.138 m Patient Age:    64 years        BP:           175/82 mmHg Patient Gender: M               HR:           96 bpm. Exam Location:  Inpatient Procedure: 2D Echo and Intracardiac Opacification  Agent Indications:    I31.3 Pericardial effusion (noninflammatory)  History:        Patient has prior history of Echocardiogram examinations, most                 recent 12/31/2019. Abnormal ECG, Arrythmias:Atrial Fibrillation,                 Signs/Symptoms:Dyspnea, Chest Pain, Altered Mental Status,                 Bacteremia and Fever; Risk Factors:Hypertension, Diabetes and                 Sleep Apnea.  Sonographer:    Roseanna Rainbow RDCS Referring Phys: [place]  Sonographer Comments: Technically difficult study due to poor echo windows and patient is morbidly obese. Image acquisition challenging due to patient body habitus. Patient in high fowler's position. IMPRESSIONS  1. Diffuse hypokinesis with abnormal septal motion . Left ventricular ejection fraction, by estimation, is 25 to 30%. The left ventricle has severely decreased function. The left ventricle demonstrates global hypokinesis. The left ventricular internal cavity size was moderately to severely dilated. There is moderate asymmetric left ventricular hypertrophy of the basal and septal segments. Left ventricular diastolic parameters were normal.  2. Right ventricular systolic function is normal. The right ventricular size is normal.  3. The pericardial effusion is lateral to the left ventricle.  4. The mitral valve is normal in structure. Trivial mitral valve regurgitation. No evidence of mitral stenosis.  5. The aortic valve is normal in structure. Aortic valve regurgitation is not visualized. No aortic stenosis is present.  6. The inferior vena cava is normal in size with greater than 50% respiratory variability, suggesting right atrial pressure of 3 mmHg. FINDINGS  Left Ventricle: Diffuse hypokinesis with abnormal septal motion. Left ventricular ejection fraction, by estimation, is 25 to 30%. The left ventricle has severely decreased function. The left ventricle demonstrates global hypokinesis. Definity contrast agent was given IV to delineate  the  left ventricular endocardial borders. The left ventricular internal cavity size was moderately to severely dilated. There is moderate asymmetric left ventricular hypertrophy of the basal and septal segments. Left ventricular diastolic parameters were normal. Right Ventricle: The right ventricular size is normal. No increase in right ventricular wall thickness. Right ventricular systolic function is normal. Left Atrium: Left atrial size was normal in size. Right Atrium: Right atrial size was normal in size. Pericardium: Trivial pericardial effusion is present. The pericardial effusion is lateral to the left ventricle. Mitral Valve: The mitral valve is normal in structure. Trivial mitral valve regurgitation. No evidence of mitral valve stenosis. Tricuspid Valve: The tricuspid valve is normal in structure. Tricuspid valve regurgitation is mild . No evidence of tricuspid stenosis. Aortic Valve: The aortic valve is normal in structure. Aortic valve regurgitation is not visualized. No aortic stenosis is present. Pulmonic Valve: The pulmonic valve was normal in structure. Pulmonic valve regurgitation is not visualized. No evidence of pulmonic stenosis. Aorta: The aortic root is normal in size and structure. Venous: The inferior vena cava is normal in size with greater than 50% respiratory variability, suggesting right atrial pressure of 3 mmHg. IAS/Shunts: The interatrial septum was not well visualized.  LEFT VENTRICLE PLAX 2D LVIDd:         4.90 cm      Diastology LVIDs:         4.20 cm      LV e' medial:    4.90 cm/s LV PW:         1.80 cm      LV E/e' medial:  14.5 LV IVS:        1.60 cm      LV e' lateral:   7.18 cm/s LVOT diam:     2.40 cm      LV E/e' lateral: 9.9 LV SV:         70 LV SV Index:   33 LVOT Area:     4.52 cm  LV Volumes (MOD) LV vol d, MOD A2C: 145.0 ml LV vol d, MOD A4C: 152.0 ml LV vol s, MOD A2C: 89.8 ml LV vol s, MOD A4C: 101.0 ml LV SV MOD A2C:     55.2 ml LV SV MOD A4C:     152.0 ml LV SV MOD  BP:      57.1 ml RIGHT VENTRICLE             IVC RV S prime:     17.90 cm/s  IVC diam: 1.50 cm TAPSE (M-mode): 2.5 cm LEFT ATRIUM           Index       RIGHT ATRIUM           Index LA diam:      4.20 cm 1.96 cm/m  RA Area:     11.50 cm LA Vol (A2C): 17.7 ml 8.28 ml/m  RA Volume:   22.70 ml  10.62 ml/m LA Vol (A4C): 40.9 ml 19.13 ml/m  AORTIC VALVE LVOT Vmax:   114.00 cm/s LVOT Vmean:  78.600 cm/s LVOT VTI:    0.154 m  AORTA Ao Root diam: 3.60 cm Ao Asc diam:  3.30 cm MITRAL VALVE MV Area (PHT): 3.31 cm    SHUNTS MV Decel Time: 229 msec    Systemic VTI:  0.15 m MV E velocity: 71.10 cm/s  Systemic Diam: 2.40 cm MV A velocity: 71.35 cm/s MV E/A ratio:  1.00 Jenkins Rouge MD Electronically signed by Jenkins Rouge MD  Signature Date/Time: 05/16/2020/12:43:46 PM    Final     Assessment: 64 year old man with multiple medical problems who presented to the ED with altered mental status found to have worsening rightperinephric abscess with air as well as possible liver abscesses and 4 mm left proximal ureteral calculus with perinephric strandingwith clinical signs of sepsis.  Patient now with right retroperitoneal hematoma without evidence of active bleed.  Repeat CT shows continued presence of left ureteral calculus.    WBC and creatinine improving however hemoglobin downtrending and requiring 2 units packed RBCs today.  1. RIGHT perinephric abscess:Continue R PCN drain, tailor antibiotics to Sensitivities; repeat CT now shows right retroperitoneal bleed without active extravasation.  Serial Hgbs and supportive management.   Patient is receiving blood transfusion today and if continues to bleed may need to consider repeat CT to re-evaluate for active bleed.  2. LEFT 4 mm proximal ureteral calculus:patient is clinically improving with right perinephric drain, will continue to observe for now; Cr improving; stone still present on repeat CT.  No stable enough for intervention in OR and WBC improving.   If  concern for worsening renal function or infection will need left PCN tube.   3. Urethral stricture:continueFoley catheter for maximal decompression  Thank you for involving me in this patient's care.Please page with any further questions or concerns.   Jacalyn Lefevre, MD Urology 05/18/2020, 11:38 AM

## 2020-05-19 DIAGNOSIS — A419 Sepsis, unspecified organism: Secondary | ICD-10-CM | POA: Diagnosis not present

## 2020-05-19 DIAGNOSIS — L0291 Cutaneous abscess, unspecified: Secondary | ICD-10-CM

## 2020-05-19 DIAGNOSIS — R6520 Severe sepsis without septic shock: Secondary | ICD-10-CM | POA: Diagnosis not present

## 2020-05-19 DIAGNOSIS — N179 Acute kidney failure, unspecified: Secondary | ICD-10-CM | POA: Diagnosis not present

## 2020-05-19 LAB — TYPE AND SCREEN
ABO/RH(D): AB POS
Antibody Screen: NEGATIVE
Unit division: 0
Unit division: 0

## 2020-05-19 LAB — BPAM RBC
Blood Product Expiration Date: 202110222359
Blood Product Expiration Date: 202110222359
ISSUE DATE / TIME: 202110080651
ISSUE DATE / TIME: 202110081220
Unit Type and Rh: 6200
Unit Type and Rh: 6200

## 2020-05-19 LAB — CBC
HCT: 27.1 % — ABNORMAL LOW (ref 39.0–52.0)
Hemoglobin: 8.7 g/dL — ABNORMAL LOW (ref 13.0–17.0)
MCH: 28.6 pg (ref 26.0–34.0)
MCHC: 32.1 g/dL (ref 30.0–36.0)
MCV: 89.1 fL (ref 80.0–100.0)
Platelets: 291 10*3/uL (ref 150–400)
RBC: 3.04 MIL/uL — ABNORMAL LOW (ref 4.22–5.81)
RDW: 17.8 % — ABNORMAL HIGH (ref 11.5–15.5)
WBC: 17.2 10*3/uL — ABNORMAL HIGH (ref 4.0–10.5)
nRBC: 0.3 % — ABNORMAL HIGH (ref 0.0–0.2)

## 2020-05-19 LAB — COMPREHENSIVE METABOLIC PANEL
ALT: 11 U/L (ref 0–44)
AST: 10 U/L — ABNORMAL LOW (ref 15–41)
Albumin: 2.3 g/dL — ABNORMAL LOW (ref 3.5–5.0)
Alkaline Phosphatase: 39 U/L (ref 38–126)
Anion gap: 9 (ref 5–15)
BUN: 24 mg/dL — ABNORMAL HIGH (ref 8–23)
CO2: 26 mmol/L (ref 22–32)
Calcium: 7.7 mg/dL — ABNORMAL LOW (ref 8.9–10.3)
Chloride: 104 mmol/L (ref 98–111)
Creatinine, Ser: 1.54 mg/dL — ABNORMAL HIGH (ref 0.61–1.24)
GFR, Estimated: 47 mL/min — ABNORMAL LOW (ref >60)
Glucose, Bld: 177 mg/dL — ABNORMAL HIGH (ref 70–99)
Potassium: 3.6 mmol/L (ref 3.5–5.1)
Sodium: 139 mmol/L (ref 135–145)
Total Bilirubin: 0.6 mg/dL (ref 0.3–1.2)
Total Protein: 6.5 g/dL (ref 6.5–8.1)

## 2020-05-19 LAB — GLUCOSE, CAPILLARY
Glucose-Capillary: 190 mg/dL — ABNORMAL HIGH (ref 70–99)
Glucose-Capillary: 179 mg/dL — ABNORMAL HIGH (ref 70–99)
Glucose-Capillary: 144 mg/dL — ABNORMAL HIGH (ref 70–99)
Glucose-Capillary: 229 mg/dL — ABNORMAL HIGH (ref 70–99)
Glucose-Capillary: 253 mg/dL — ABNORMAL HIGH (ref 70–99)
Glucose-Capillary: 185 mg/dL — ABNORMAL HIGH (ref 70–99)

## 2020-05-19 LAB — CULTURE, BLOOD (ROUTINE X 2)
Culture: NO GROWTH
Culture: NO GROWTH
Special Requests: ADEQUATE
Special Requests: ADEQUATE

## 2020-05-19 MED ORDER — HYDRALAZINE HCL 10 MG PO TABS
10.0000 mg | ORAL_TABLET | Freq: Three times a day (TID) | ORAL | Status: DC
  Administered 2020-05-19 – 2020-05-24 (×15): 10 mg via ORAL
  Filled 2020-05-19 (×15): qty 1

## 2020-05-19 MED ORDER — POTASSIUM CHLORIDE CRYS ER 20 MEQ PO TBCR
40.0000 meq | EXTENDED_RELEASE_TABLET | Freq: Once | ORAL | Status: AC
  Administered 2020-05-19: 40 meq via ORAL
  Filled 2020-05-19: qty 2

## 2020-05-19 MED ORDER — TRAZODONE HCL 50 MG PO TABS
100.0000 mg | ORAL_TABLET | Freq: Every evening | ORAL | Status: DC | PRN
  Administered 2020-05-19 – 2020-05-20 (×2): 100 mg via ORAL
  Filled 2020-05-19 (×2): qty 2

## 2020-05-19 NOTE — Progress Notes (Signed)
CPAP discussed earlier with pt, pt requesting O2 and prefers not to utilize CPAP QHS.

## 2020-05-19 NOTE — Progress Notes (Signed)
Subjective: Complains of abdominal pain, controlled.  No nausea or emesis.  Passing flatus.  Urine is clear yellow in the urometer.  Objective: Vital signs in last 24 hours: Temp:  [98.5 F (36.9 C)-100.2 F (37.9 C)] 100.2 F (37.9 C) (10/09 0700) Pulse Rate:  [66-81] 73 (10/09 0700) Resp:  [12-24] 20 (10/09 0700) BP: (133-194)/(67-103) 172/80 (10/09 0700) SpO2:  [91 %-98 %] 93 % (10/09 0700) Weight:  [108.2 kg] 108.2 kg (10/09 0455)  Intake/Output from previous day: 10/08 0701 - 10/09 0700 In: 2373.4 [P.O.:360; Blood:613; IV Piggyback:100.4] Out: 2055 [Urine:2050; Drains:5] Intake/Output this shift: No intake/output data recorded.  Physical Exam:  General: Alert and oriented CV: RRR Lungs: Clear Abdomen: Soft, ND, ATTP Ext: NT, No erythema  Lab Results: Recent Labs    05/17/20 0251 05/18/20 0304 05/19/20 0245  HGB 7.2* 6.6* 8.7*  HCT 23.0* 21.3* 27.1*   BMET Recent Labs    05/18/20 0304 05/19/20 0245  NA 139 139  K 3.4* 3.6  CL 102 104  CO2 25 26  GLUCOSE 183* 177*  BUN 28* 24*  CREATININE 1.62* 1.54*  CALCIUM 7.8* 7.7*     Studies/Results: CT ABDOMEN PELVIS WO CONTRAST  Result Date: 05/18/2020 CLINICAL DATA:  Retroperitoneal hemorrhage, retroperitoneal abscess EXAM: CT ABDOMEN AND PELVIS WITHOUT CONTRAST TECHNIQUE: Multidetector CT imaging of the abdomen and pelvis was performed following the standard protocol without IV contrast. COMPARISON:  05/15/2020 FINDINGS: Lower chest: Trace left and small right pleural effusions are present, decreased in size since prior examination. Bibasilar atelectasis is again identified. Minimal coronary artery calcification. Global cardiac size within normal limits. Hepatobiliary: Wedge like regions of a macroscopic fat within the left hepatic lobe are stable, and unchanged from prior MRI examination of 03/22/2020. Mild asymmetric parenchymal thickening and perihepatic inflammatory stranding adjacent to the drained  pararenal abscess is again identified and appears stable since prior examination, not well assessed on this noncontrast examination. Vicarious excretion of contrast noted within the gallbladder lumen. The gallbladder is otherwise unremarkable. No intra or extrahepatic biliary ductal dilation. Pancreas: Unremarkable Spleen: Unremarkable Adrenals/Urinary Tract: The adrenal glands are unremarkable. Left kidney is normal in size and position. Exophytic renal cyst is again noted arising from the lower pole of the left kidney. Percutaneous drainage catheter is again seen coursing into the pararenal space adjacent to the upper pole of the right kidney. Large subcapsular hematoma is again identified involving the right kidney with infiltrative high density material compatible with blood extending throughout the pararenal space and layering along Gerota's fascia. This appears stable since prior examination. When measured in similar fashion, the hematoma measures 8.7 x 5.6 cm in dimension. No hydronephrosis. No intrarenal or ureteral calculi. The bladder is decompressed with a Foley catheter balloon seen within its lumen. Stomach/Bowel: Stomach, small bowel, are unremarkable. Surgical changes of right hemicolectomy are identified the residual large bowel is unremarkable save for mild scattered sigmoid diverticulosis. No free intraperitoneal gas. Trace free fluid within the pelvis is unchanged. Vascular/Lymphatic: Mild aortoiliac atherosclerotic calcification without evidence of aneurysm. No pathologic adenopathy within the abdomen and pelvis. Reproductive: Status post hysterectomy. No adnexal masses. Other: Three rectum unremarkable. Mild subcutaneous edema within the flanks bilaterally is stable. Musculoskeletal: No lytic or blastic bone lesions are identified. IMPRESSION: Stable examination. Drainage catheter noted within the pararenal space adjacent to the upper pole the right kidney. Large subcapsular hematoma with  hemorrhagic material seen infiltrating within the pararenal space unchanged from prior examination. Poorly delineated inflammatory changes involving the a posterior  right hepatic lobe adjacent to the previously noted pararenal abscess is again identified and, while not well assessed on this noncontrast examination, appears stable. Electronically Signed   By: Fidela Salisbury MD   On: 05/18/2020 21:59   DG CHEST PORT 1 VIEW  Result Date: 05/17/2020 CLINICAL DATA:  Recent aspiration EXAM: PORTABLE CHEST 1 VIEW COMPARISON:  05/16/2020 FINDINGS: Cardiac shadow is enlarged but stable. Tortuous thoracic aorta is seen. Mild thickening of the minor fissure is noted consistent with small right-sided pleural effusion. Drainage catheter is noted in the right upper quadrant. Previously seen bibasilar opacities have improved in the interval from the prior exam. IMPRESSION: Improved aeration in the bases bilaterally. No new focal abnormality is noted. Electronically Signed   By: Inez Catalina M.D.   On: 05/17/2020 20:12    Assessment/Plan: 1. Right perinephric abscess 2. Left 4 mm proximal ureteral calculus 3.  Urethral stricture  -Reviewed CT A/P 10/8 with stable examination.  Drainage catheter is within the pararenal space adjacent to the right upper pole with a large subcapsular hematoma that is unchanged from prior imaging. -Hemoglobin appropriate 8.7 from 6.6 yesterday after 2 units of PRBC.  No need for repeat transfusion today. -No sign of hemorrhage, consider CTA to evaluate for active bleed.  No evidence of active bleed at this point. -Regarding left ureteral stone, creatinine is improving to 1.54 from 1.62.  Continue observation for now.  If concern for infection or worsening renal function, we will proceed with left PCN. -Leave Foley catheter to gravity drainage for maximal decompression -Following along   LOS: 6 days   Janith Lima 05/19/2020, 9:33 AM Matt R. [facility]  Pager:  [phone]

## 2020-05-19 NOTE — Progress Notes (Signed)
PROGRESS NOTE    Marco [name]  RXV:[id number] DOB: Feb 20, 1956 DOA: 05/12/2020 PCP: [name], MD    Brief Narrative:  64 yo AA male with PMH DMII, HTN, dCHF (EF most recently 50-55% in May, grade 1 DD), RA on biologic therapy.  Patient was admitted in April / May most recently. Had sepsis, hypotension at that time secondary to UTI with a R perinephric abscess (3.9 cm at that time). Drainage of perinephric abscess in late May grew out E.Coli sensitive to cefepime, rocephin.  Assessment & Plan:   Principal Problem:   Severe sepsis ([place]) Active Problems:   Elevated troponin   Atrial fibrillation (HCC)   Essential hypertension   Rheumatoid arthritis (HCC)   Type 2 diabetes mellitus with stage 3 chronic kidney disease, without long-term current use of insulin (HCC)   Chronic combined systolic and diastolic CHF (congestive heart failure) (HCC)   Acute renal failure superimposed on stage 3 chronic kidney disease (HCC)   Acute metabolic encephalopathy   Nephrolithiasis   Perinephric abscess   Pyohydronephrosis  Severe sepsis (HCC) -Presenting fever, tachycardia, tachypnea, leukocytosis, lactic acidosis, encephalopathy, source of infection considered urinary/perinephric abscess - Blood, urine, and perinephric abscess cultures growing E. Coli - abx was later de-escalated to rocephin monotherapy - PCT had been downtrending -Fevers noted this AM. WBC remains stable at 17.2k -repeat cbc in AM  Perinephric abscess - right sided; seen on CT - s/p drain placement with IR on 10/3 - plan per above  -Repeat CT on 10/5 due to worsening abdominal pain and distension. CT findings notable for R sided retroperitoneal bleed with pigtail cath abutting and potentially within the the R renal cortex. Appreciate input by Urology and IR. - Continue drain for now, plan f/u imaging once drain output becomes minimal over 3-4 day period or if clinical status worsens -Recently required 2 units  of PRBC's. F/u CT reviewed. No evidence of acute bleed, appears stabel  Acute renal failure superimposed on stage 3 chronic kidney disease ([place]) -Unclear etiology, but possible contribution from outlet obstruction versus severe sepsis vs cardiorenal syndrome related to systolic failure below -Patient may still need left-sided percutaneous nephrostomy tube -IVF held secondary to below acute chf exacerbation - Renal function continuing to improve with diuresis - Recheck bmet in AM  Elevated troponin -Patient has history of elevated troponins in setting of acute infections. This is considered due to demand. He has recurrent chest pain/shortness of breath which may also be due to ongoing fluid resuscitation from hypotension in setting of sepsis -Trend troponin but likely due to demand ischemia at this time -Cont to monitor for now  Nephrolithiasis -CT reveals a 4 mm left-sided ureteral calculus.  -IVF currently on hold secondary to below -Urology continues to follow. If concern for worsening renal function or infection will need L PCN  Acute metabolic encephalopathy -Earlier encephalopathic -Mentation now improved. Likely secondary to acute infection  Hyperkalemia-resolved as of 05/14/2020 - continue tele - Potassium low at 3.6 -Will give 72meq kcl today -Repeat bmet in AM  Acute on Chronic combined systolic and diastolic CHF (congestive heart failure) ([place]) -Recent CXR ordered and reviewed. Finding suggestive of vascular pulm congestion -IVF held. Pt started on scheduled IV lasix with good results -2d echo reviewed, findings of EF of 25-30% -Chart reviewed. Pt is well known to heart failure service as outpatient and noted to have variable EF heart failure with EF of 25% in 2018, more recently 50% several months ago. Pt is off  entresto secondary to hypotension and off hydralazine. Pt is off Jardiance secondary to n/v.  -Work up was initially suggestive of TTR amyloidosis although  later genetic testing was neg. Repeat PYP on 1/21 was read as equivocal although Cardiology felt it to be negative -Earlier discussed with Cardiology who agrees with diuresis as tolerated, agrees with titrating coreg as BP tolerates. Now on 25mg  bid coreg -Will start low dose hydralazine. If BP tolerates, would then add nitrate later.  Type 2 diabetes mellitus with stage 3 chronic kidney disease, without long-term current use of insulin (HCC) - continue SSI and CBG monitoring as needed -glucose overall stable  Rheumatoid arthritis ([place]) -On outpatient treatment -Continuing to hold secukinumab given active infection -was started on steroids as patient is on chronic prednisone 5 mg daily at home as well  Essential hypertension -continue 25mg  bid coreg per above -BP suboptimally controlled -Will start low dose hydralazine. If bp tolerates, then would add imdur later  Atrial fibrillation ([place]) -Cont on beta blocker per above -Aspirin on hold in case of need for further interventions  DVT prophylaxis: SCD's Code Status: Full Family Communication: Pt in room  Status is: Inpatient  Remains inpatient appropriate because:Hemodynamically unstable, Ongoing diagnostic testing needed not appropriate for outpatient work up, Unsafe d/c plan and IV treatments appropriate due to intensity of illness or inability to take PO   Dispo: The patient is from: Home              Anticipated d/c is to: Unknown at this time. Will need PT eval when more medically stable              Anticipated d/c date is: > 3 days              Patient currently is not medically stable to d/c.   Consultants:   Urology  IR  Procedures:   Right perinephric abscess drain placement, 05/13/20  Antimicrobials: Anti-infectives (From admission, onward)   Start     Dose/Rate Route Frequency Ordered Stop   05/13/20 1800  cefTRIAXone (ROCEPHIN) 2 g in sodium chloride 0.9 % 100 mL IVPB        2 g 200 mL/hr over 30  Minutes Intravenous Every 24 hours 05/13/20 1748     05/13/20 1630  metroNIDAZOLE (FLAGYL) IVPB 500 mg  Status:  Discontinued        500 mg 100 mL/hr over 60 Minutes Intravenous Every 8 hours 05/13/20 1618 05/17/20 1348   05/13/20 1300  ceFEPIme (MAXIPIME) 2 g in sodium chloride 0.9 % 100 mL IVPB  Status:  Discontinued        2 g 200 mL/hr over 30 Minutes Intravenous Every 12 hours 05/13/20 0353 05/13/20 1748   05/13/20 0400  ceFEPIme (MAXIPIME) 2 g in sodium chloride 0.9 % 100 mL IVPB  Status:  Discontinued        2 g 200 mL/hr over 30 Minutes Intravenous  Once 05/13/20 0347 05/13/20 0352   05/13/20 0015  vancomycin (VANCOREADY) IVPB 2000 mg/400 mL        2,000 mg 200 mL/hr over 120 Minutes Intravenous  Once 05/13/20 0004 05/13/20 0315   05/13/20 0000  ceFEPIme (MAXIPIME) 2 g in sodium chloride 0.9 % 100 mL IVPB        2 g 200 mL/hr over 30 Minutes Intravenous  Once 05/12/20 2356 05/13/20 0110   05/13/20 0000  metroNIDAZOLE (FLAGYL) IVPB 500 mg        500 mg 100 mL/hr  over 60 Minutes Intravenous  Once 05/12/20 2356 05/13/20 0202   05/13/20 0000  vancomycin (VANCOCIN) IVPB 1000 mg/200 mL premix  Status:  Discontinued        1,000 mg 200 mL/hr over 60 Minutes Intravenous  Once 05/12/20 2356 05/13/20 0003      Subjective: Without complaints this AM. Denies sob  Objective: Vitals:   05/19/20 0700 05/19/20 0800 05/19/20 0900 05/19/20 1200  BP: (!) 172/80 (!) 174/76 (!) 166/77 (!) 180/76  Pulse: 73 79 77 79  Resp: 20 (!) 25 (!) 22 13  Temp: 100.2 F (37.9 C) 100.2 F (37.9 C) (!) 100.4 F (38 C) (!) 101.3 F (38.5 C)  TempSrc: Bladder     SpO2: 93% 94% 95% 95%  Weight:      Height:        Intake/Output Summary (Last 24 hours) at 05/19/2020 1409 Last data filed at 05/19/2020 1144 Gross per 24 hour  Intake 1922.44 ml  Output 2830 ml  Net -907.56 ml   Filed Weights   05/13/20 0400 05/18/20 0600 05/19/20 0455  Weight: 106.2 kg 108 kg 108.2 kg    Examination: General  exam: Awake, laying in bed, in nad Respiratory system: Normal respiratory effort, no wheezing Cardiovascular system: regular rate, s1, s2 Gastrointestinal system: Soft, nondistended, positive BS Central nervous system: CN2-12 grossly intact, strength intact Extremities: Perfused, no clubbing Skin: Normal skin turgor, no notable skin lesions seen Psychiatry: Mood normal // no visual hallucinations   Data Reviewed: I have personally reviewed following labs and imaging studies  CBC: Recent Labs  Lab 05/15/20 1316 05/15/20 1316 05/15/20 1901 05/15/20 1901 05/15/20 2342 05/16/20 0307 05/17/20 0251 05/18/20 0304 05/19/20 0245  WBC 23.1*   < > 21.1*  --   --  19.3* 17.6* 17.4* 17.2*  NEUTROABS 19.7*  --  17.0*  --   --  15.1* 12.2* 12.4*  --   HGB 8.0*   < > 8.0*   < > 7.6* 7.3* 7.2* 6.6* 8.7*  HCT 24.8*   < > 25.8*   < > 23.9* 23.3* 23.0* 21.3* 27.1*  MCV 87.9   < > 89.9  --   --  90.0 88.8 88.8 89.1  PLT 246   < > 245  --   --  244 262 259 291   < > = values in this interval not displayed.   Basic Metabolic Panel: Recent Labs  Lab 05/14/20 0509 05/14/20 0509 05/15/20 0055 05/15/20 0344 05/15/20 1316 05/16/20 0307 05/17/20 0251 05/18/20 0304 05/19/20 0245  NA 139   < > 142   < > 143 140 146* 139 139  K 4.5   < > 3.7   < > 4.0 3.4* 3.7 3.4* 3.6  CL 108   < > 104   < > 105 104 107 102 104  CO2 23   < > 22   < > 22 25 27 25 26   GLUCOSE 154*   < > 230*   < > 259* 161* 152* 183* 177*  BUN 30*   < > 37*   < > 41* 38* 33* 28* 24*  CREATININE 1.80*   < > 2.03*   < > 1.99* 1.83* 1.73* 1.62* 1.54*  CALCIUM 7.6*   < > 8.6*   < > 8.4* 8.0* 8.4* 7.8* 7.7*  MG 2.0  --  2.3  --   --  2.1 2.2 2.0  --   PHOS 3.3  --  3.5  --   --  2.9 2.5 2.4*  --    < > = values in this interval not displayed.   GFR: Estimated Creatinine Clearance: 55.2 mL/min (A) (by C-G formula based on SCr of 1.54 mg/dL (H)). Liver Function Tests: Recent Labs  Lab 05/13/20 0054 05/13/20 0700 05/15/20 0344  05/19/20 0245  AST 20 16 14* 10*  ALT 23 19 17 11   ALKPHOS 59 45 47 39  BILITOT 1.2 1.2 0.8 0.6  PROT 7.7 6.6 7.1 6.5  ALBUMIN 2.8* 2.3* 2.3* 2.3*   No results for input(s): LIPASE, AMYLASE in the last 168 hours. No results for input(s): AMMONIA in the last 168 hours. Coagulation Profile: Recent Labs  Lab 05/13/20 0054 05/13/20 0700  INR 1.3* 1.4*   Cardiac Enzymes: No results for input(s): CKTOTAL, CKMB, CKMBINDEX, TROPONINI in the last 168 hours. BNP (last 3 results) No results for input(s): PROBNP in the last 8760 hours. HbA1C: No results for input(s): HGBA1C in the last 72 hours. CBG: Recent Labs  Lab 05/18/20 1109 05/18/20 1753 05/18/20 2101 05/19/20 0741 05/19/20 1205  GLUCAP 161* 190* 179* 144* 229*   Lipid Profile: No results for input(s): CHOL, HDL, LDLCALC, TRIG, CHOLHDL, LDLDIRECT in the last 72 hours. Thyroid Function Tests: No results for input(s): TSH, T4TOTAL, FREET4, T3FREE, THYROIDAB in the last 72 hours. Anemia Panel: No results for input(s): VITAMINB12, FOLATE, FERRITIN, TIBC, IRON, RETICCTPCT in the last 72 hours. Sepsis Labs: Recent Labs  Lab 05/13/20 0340 05/13/20 0700 05/14/20 0509 05/15/20 0055 05/15/20 0344 05/15/20 1316 05/15/20 1901 05/15/20 2342 05/16/20 0307  PROCALCITON  --  15.16 11.37 7.46  --   --   --   --   --   LATICACIDVEN   < >  --   --  4.5*   < > 3.0* 2.9* 2.3* 2.1*   < > = values in this interval not displayed.    Recent Results (from the past 240 hour(s))  Blood Culture (routine x 2)     Status: Abnormal   Collection Time: 05/13/20 12:10 AM   Specimen: BLOOD  Result Value Ref Range Status   Specimen Description   Final    BLOOD RIGHT ANTECUBITAL Performed at [facility] [address], [place], [place] [address]    Special Requests   Final    BOTTLES DRAWN AEROBIC AND ANAEROBIC Blood Culture adequate volume Performed at [facility] [address], [place],  [place] [address]    Culture  Setup Time   Final    GRAM NEGATIVE RODS IN BOTH AEROBIC AND ANAEROBIC BOTTLES Organism ID to follow CRITICAL RESULT CALLED TO, READ BACK BY AND VERIFIED WITH: Shelda Jakes PHARMD, AT 1704 05/13/20 BY Rush [facility]mark Performed at [facility], [place] [address], [place], [place] [address]    Culture ESCHERICHIA COLI (A)  Final   Report Status 05/15/2020 FINAL  Final   Organism ID, Bacteria ESCHERICHIA COLI  Final      Susceptibility   Escherichia coli - MIC*    AMPICILLIN >=32 RESISTANT Resistant     CEFAZOLIN >=64 RESISTANT Resistant     CEFEPIME <=0.12 SENSITIVE Sensitive     CEFTAZIDIME <=1 SENSITIVE Sensitive     CEFTRIAXONE 1 SENSITIVE Sensitive     CIPROFLOXACIN <=0.25 SENSITIVE Sensitive     GENTAMICIN <=1 SENSITIVE Sensitive     IMIPENEM <=0.25 SENSITIVE Sensitive     TRIMETH/SULFA <=20 SENSITIVE Sensitive     AMPICILLIN/SULBACTAM >=32 RESISTANT Resistant     PIP/TAZO  8 SENSITIVE Sensitive     * ESCHERICHIA COLI  Blood Culture ID Panel (Reflexed)     Status: Abnormal   Collection Time: 05/13/20 12:10 AM  Result Value Ref Range Status   Enterococcus faecalis NOT DETECTED NOT DETECTED Final   Enterococcus Faecium NOT DETECTED NOT DETECTED Final   Listeria monocytogenes NOT DETECTED NOT DETECTED Final   Staphylococcus species NOT DETECTED NOT DETECTED Final   Staphylococcus aureus (BCID) NOT DETECTED NOT DETECTED Final   Staphylococcus epidermidis NOT DETECTED NOT DETECTED Final   Staphylococcus lugdunensis NOT DETECTED NOT DETECTED Final   Streptococcus species NOT DETECTED NOT DETECTED Final   Streptococcus agalactiae NOT DETECTED NOT DETECTED Final   Streptococcus pneumoniae NOT DETECTED NOT DETECTED Final   Streptococcus pyogenes NOT DETECTED NOT DETECTED Final   A.calcoaceticus-baumannii NOT DETECTED NOT DETECTED Final   Bacteroides fragilis NOT DETECTED NOT DETECTED Final   Enterobacterales DETECTED (A) NOT DETECTED Final    Comment:  Enterobacterales represent a large order of gram negative bacteria, not a single organism. CRITICAL RESULT CALLED TO, READ BACK BY AND VERIFIED WITH: Shelda Jakes PHARMD, AT 1704 05/13/20 BY D. VANHOOK    Enterobacter cloacae complex NOT DETECTED NOT DETECTED Final   Escherichia coli DETECTED (A) NOT DETECTED Final    Comment: CRITICAL RESULT CALLED TO, READ BACK BY AND VERIFIED WITH: Shelda Jakes PHARMD, AT 1704 05/13/20 BY D. VANHOOK    Klebsiella aerogenes NOT DETECTED NOT DETECTED Final   Klebsiella oxytoca NOT DETECTED NOT DETECTED Final   Klebsiella pneumoniae NOT DETECTED NOT DETECTED Final   Proteus species NOT DETECTED NOT DETECTED Final   Salmonella species NOT DETECTED NOT DETECTED Final   Serratia marcescens NOT DETECTED NOT DETECTED Final   Haemophilus influenzae NOT DETECTED NOT DETECTED Final   Neisseria meningitidis NOT DETECTED NOT DETECTED Final   Pseudomonas aeruginosa NOT DETECTED NOT DETECTED Final   Stenotrophomonas maltophilia NOT DETECTED NOT DETECTED Final   Candida albicans NOT DETECTED NOT DETECTED Final   Candida auris NOT DETECTED NOT DETECTED Final   Candida glabrata NOT DETECTED NOT DETECTED Final   Candida krusei NOT DETECTED NOT DETECTED Final   Candida parapsilosis NOT DETECTED NOT DETECTED Final   Candida tropicalis NOT DETECTED NOT DETECTED Final   Cryptococcus neoformans/gattii NOT DETECTED NOT DETECTED Final   CTX-M ESBL NOT DETECTED NOT DETECTED Final   Carbapenem resistance IMP NOT DETECTED NOT DETECTED Final   Carbapenem resistance KPC NOT DETECTED NOT DETECTED Final   Carbapenem resistance NDM NOT DETECTED NOT DETECTED Final   Carbapenem resist OXA 48 LIKE NOT DETECTED NOT DETECTED Final   Carbapenem resistance VIM NOT DETECTED NOT DETECTED Final    Comment: Performed at [facility], [address] [place], [place] [address]  Blood Culture (routine x 2)     Status: None   Collection Time: 05/13/20 12:54 AM   Specimen: BLOOD LEFT FOREARM    Result Value Ref Range Status   Specimen Description   Final    BLOOD LEFT FOREARM Performed at [facility] [address] [place], [place] [address]    Special Requests   Final    BOTTLES DRAWN AEROBIC AND ANAEROBIC Blood Culture results may not be optimal due to an excessive volume of blood received in culture bottles Performed at [facility] [address] [place], [place] [address]    Culture   Final    NO GROWTH 5 DAYS Performed at [facility], [place]  [address] [place], [place] [address]    Report Status 05/18/2020 FINAL  Final  Urine culture     Status: Abnormal   Collection Time: 05/13/20 12:54 AM   Specimen: In/Out Cath Urine  Result Value Ref Range Status   Specimen Description   Final    IN/OUT CATH URINE Performed at [facility] [address], [place], [place] [address]    Special Requests   Final    NONE Performed at [facility], [place] [address] [place],  [address]    Culture >=100,000 COLONIES/mL ESCHERICHIA COLI (A)  Final   Report Status 05/15/2020 FINAL  Final   Organism ID, Bacteria ESCHERICHIA COLI (A)  Final      Susceptibility   Escherichia coli - MIC*    AMPICILLIN >=32 RESISTANT Resistant     CEFAZOLIN 8 SENSITIVE Sensitive     CEFTRIAXONE 0.5 SENSITIVE Sensitive     CIPROFLOXACIN <=0.25 SENSITIVE Sensitive     GENTAMICIN <=1 SENSITIVE Sensitive     IMIPENEM <=0.25 SENSITIVE Sensitive     NITROFURANTOIN <=16 SENSITIVE Sensitive     TRIMETH/SULFA <=20 SENSITIVE Sensitive     AMPICILLIN/SULBACTAM >=32 RESISTANT Resistant     PIP/TAZO 8 SENSITIVE Sensitive     * >=100,000 COLONIES/mL ESCHERICHIA COLI  Respiratory Panel by RT PCR (Flu A&B, Covid) - Nasopharyngeal Swab     Status: None   Collection Time: 05/13/20  1:26 AM   Specimen: Nasopharyngeal Swab  Result Value Ref Range Status   SARS Coronavirus 2 by RT PCR NEGATIVE NEGATIVE Final    Comment:  (NOTE) SARS-CoV-2 target nucleic acids are NOT DETECTED.  The SARS-CoV-2 RNA is generally detectable in upper respiratoy specimens during the acute phase of infection. The lowest concentration of SARS-CoV-2 viral copies this assay can detect is 131 copies/mL. A negative result does not preclude SARS-Cov-2 infection and should not be used as the sole basis for treatment or other patient management decisions. A negative result may occur with  improper specimen collection/handling, submission of specimen other than nasopharyngeal swab, presence of viral mutation(s) within the areas targeted by this assay, and inadequate number of viral copies (<131 copies/mL). A negative result must be combined with clinical observations, patient history, and epidemiological information. The expected result is Negative.  Fact Sheet for Patients:  PinkCheek.be  Fact Sheet for Healthcare Providers:  GravelBags.it  This test is no t yet approved or cleared by the [place] FDA and  has been authorized for detection and/or diagnosis of SARS-CoV-2 by FDA under an Emergency Use Authorization (EUA). This EUA will remain  in effect (meaning this test can be used) for the duration of the COVID-19 declaration under Section 564(b)(1) of the Act, 21 U.S.C. section 360bbb-3(b)(1), unless the authorization is terminated or revoked sooner.     Influenza A by PCR NEGATIVE NEGATIVE Final   Influenza B by PCR NEGATIVE NEGATIVE Final    Comment: (NOTE) The Xpert Xpress SARS-CoV-2/FLU/RSV assay is intended as an aid in  the diagnosis of influenza from Nasopharyngeal swab specimens and  should not be used as a sole basis for treatment. Nasal washings and  aspirates are unacceptable for Xpert Xpress SARS-CoV-2/FLU/RSV  testing.  Fact Sheet for Patients: PinkCheek.be  Fact Sheet for Healthcare  Providers: GravelBags.it  This test is not yet approved or cleared by the [place] FDA and  has been authorized for detection and/or diagnosis of SARS-CoV-2 by  FDA under an Emergency Use Authorization (EUA). This EUA  will remain  in effect (meaning this test can be used) for the duration of the  Covid-19 declaration under Section 564(b)(1) of the Act, 21  U.S.C. section 360bbb-3(b)(1), unless the authorization is  terminated or revoked. Performed at [facility], [place] [address], [place], [place] [address]   Aerobic/Anaerobic Culture (surgical/deep wound)     Status: None   Collection Time: 05/13/20  6:20 AM   Specimen: Abdomen; Abscess  Result Value Ref Range Status   Specimen Description   Final    ABDOMEN Performed at [facility] [address] [place] [address], [place], [place] [address]    Special Requests   Final    NONE Performed at [facility], [place] [address] [place], [place] [address]    Gram Stain   Final    ABUNDANT WBC PRESENT, PREDOMINANTLY PMN MODERATE GRAM NEGATIVE RODS    Culture   Final    ABUNDANT ESCHERICHIA COLI NO ANAEROBES ISOLATED Performed at [facility], [place] [address], [place], [place] [address]    Report Status 05/18/2020 FINAL  Final   Organism ID, Bacteria ESCHERICHIA COLI  Final      Susceptibility   Escherichia coli - MIC*    AMPICILLIN >=32 RESISTANT Resistant     CEFAZOLIN 8 SENSITIVE Sensitive     CEFEPIME <=0.12 SENSITIVE Sensitive     CEFTAZIDIME <=1 SENSITIVE Sensitive     CEFTRIAXONE <=0.25 SENSITIVE Sensitive     CIPROFLOXACIN <=0.25 SENSITIVE Sensitive     GENTAMICIN <=1 SENSITIVE Sensitive     IMIPENEM <=0.25 SENSITIVE Sensitive     TRIMETH/SULFA <=20 SENSITIVE Sensitive     AMPICILLIN/SULBACTAM >=32 RESISTANT Resistant     PIP/TAZO 8 SENSITIVE Sensitive     * ABUNDANT ESCHERICHIA COLI  Culture, blood (routine x 2)     Status: None    Collection Time: 05/14/20  5:00 AM   Specimen: BLOOD  Result Value Ref Range Status   Specimen Description   Final    BLOOD RIGHT ANTECUBITAL Performed at [facility] [address] [place], [place] [address]    Special Requests   Final    BOTTLES DRAWN AEROBIC AND ANAEROBIC Blood Culture adequate volume Performed at [facility] [address], [place], [place] [address]    Culture   Final    NO GROWTH 5 DAYS Performed at [facility], [place] [address], [place], [place] [address]    Report Status 05/19/2020 FINAL  Final  Culture, blood (routine x 2)     Status: None   Collection Time: 05/14/20  5:05 AM   Specimen: BLOOD  Result Value Ref Range Status   Specimen Description   Final    BLOOD RIGHT ANTECUBITAL Performed at [facility] [address], [place], [place] [address]    Special Requests   Final    BOTTLES DRAWN AEROBIC AND ANAEROBIC Blood Culture adequate volume Performed at [facility] [address] [place], [place] [address]    Culture   Final    NO GROWTH 5 DAYS Performed at [facility], [place] [address], [place], [place] [address]    Report Status 05/19/2020 FINAL  Final  MRSA PCR Screening     Status: Abnormal   Collection Time: 05/14/20  6:32 PM   Specimen: Nasopharyngeal  Result Value Ref Range Status   MRSA by PCR POSITIVE (A) NEGATIVE Final    Comment:  The GeneXpert MRSA Assay (FDA approved for NASAL specimens only), is one component of a comprehensive MRSA colonization surveillance program. It is not intended to diagnose MRSA infection nor to guide or monitor treatment for MRSA infections. RESULT CALLED TO, READ BACK BY AND VERIFIED WITH: Gavin Potters, K. RN @1028  ON 10.5.2021 BY [facility] Performed at [facility], [place] [address] [place], [place] [address]      Radiology Studies: CT ABDOMEN PELVIS WO CONTRAST  Result  Date: 05/18/2020 CLINICAL DATA:  Retroperitoneal hemorrhage, retroperitoneal abscess EXAM: CT ABDOMEN AND PELVIS WITHOUT CONTRAST TECHNIQUE: Multidetector CT imaging of the abdomen and pelvis was performed following the standard protocol without IV contrast. COMPARISON:  05/15/2020 FINDINGS: Lower chest: Trace left and small right pleural effusions are present, decreased in size since prior examination. Bibasilar atelectasis is again identified. Minimal coronary artery calcification. Global cardiac size within normal limits. Hepatobiliary: Wedge like regions of a macroscopic fat within the left hepatic lobe are stable, and unchanged from prior MRI examination of 03/22/2020. Mild asymmetric parenchymal thickening and perihepatic inflammatory stranding adjacent to the drained pararenal abscess is again identified and appears stable since prior examination, not well assessed on this noncontrast examination. Vicarious excretion of contrast noted within the gallbladder lumen. The gallbladder is otherwise unremarkable. No intra or extrahepatic biliary ductal dilation. Pancreas: Unremarkable Spleen: Unremarkable Adrenals/Urinary Tract: The adrenal glands are unremarkable. Left kidney is normal in size and position. Exophytic renal cyst is again noted arising from the lower pole of the left kidney. Percutaneous drainage catheter is again seen coursing into the pararenal space adjacent to the upper pole of the right kidney. Large subcapsular hematoma is again identified involving the right kidney with infiltrative high density material compatible with blood extending throughout the pararenal space and layering along Gerota's fascia. This appears stable since prior examination. When measured in similar fashion, the hematoma measures 8.7 x 5.6 cm in dimension. No hydronephrosis. No intrarenal or ureteral calculi. The bladder is decompressed with a Foley catheter balloon seen within its lumen. Stomach/Bowel: Stomach, small  bowel, are unremarkable. Surgical changes of right hemicolectomy are identified the residual large bowel is unremarkable save for mild scattered sigmoid diverticulosis. No free intraperitoneal gas. Trace free fluid within the pelvis is unchanged. Vascular/Lymphatic: Mild aortoiliac atherosclerotic calcification without evidence of aneurysm. No pathologic adenopathy within the abdomen and pelvis. Reproductive: Status post hysterectomy. No adnexal masses. Other: Three rectum unremarkable. Mild subcutaneous edema within the flanks bilaterally is stable. Musculoskeletal: No lytic or blastic bone lesions are identified. IMPRESSION: Stable examination. Drainage catheter noted within the pararenal space adjacent to the upper pole the right kidney. Large subcapsular hematoma with hemorrhagic material seen infiltrating within the pararenal space unchanged from prior examination. Poorly delineated inflammatory changes involving the a posterior right hepatic lobe adjacent to the previously noted pararenal abscess is again identified and, while not well assessed on this noncontrast examination, appears stable. Electronically Signed   By: Fidela Salisbury MD   On: 05/18/2020 21:59   DG CHEST PORT 1 VIEW  Result Date: 05/17/2020 CLINICAL DATA:  Recent aspiration EXAM: PORTABLE CHEST 1 VIEW COMPARISON:  05/16/2020 FINDINGS: Cardiac shadow is enlarged but stable. Tortuous thoracic aorta is seen. Mild thickening of the minor fissure is noted consistent with small right-sided pleural effusion. Drainage catheter is noted in the right upper quadrant. Previously seen bibasilar opacities have improved in the interval from the prior exam. IMPRESSION: Improved aeration in the bases bilaterally. No new focal abnormality is noted. Electronically Signed  By: Inez Catalina M.D.   On: 05/17/2020 20:12    Scheduled Meds: . carvedilol  25 mg Oral BID WC  . Chlorhexidine Gluconate Cloth  6 each Topical Daily  . furosemide  40 mg  Intravenous Q12H  . insulin aspart  0-9 Units Subcutaneous TID AC & HS  . mupirocin ointment  1 application Nasal BID  . predniSONE  5 mg Oral Q breakfast  . sodium chloride flush  5 mL Intracatheter Q8H   Continuous Infusions: . cefTRIAXone (ROCEPHIN)  IV Stopped (05/18/20 1857)     LOS: 6 days   Marylu Lund, MD [facility] Pager On Amion  If 7PM-7AM, please contact night-coverage 05/19/2020, 2:09 PM

## 2020-05-20 DIAGNOSIS — R6520 Severe sepsis without septic shock: Secondary | ICD-10-CM | POA: Diagnosis not present

## 2020-05-20 DIAGNOSIS — A419 Sepsis, unspecified organism: Secondary | ICD-10-CM | POA: Diagnosis not present

## 2020-05-20 DIAGNOSIS — G9341 Metabolic encephalopathy: Secondary | ICD-10-CM | POA: Diagnosis not present

## 2020-05-20 LAB — COMPREHENSIVE METABOLIC PANEL
ALT: 11 U/L (ref 0–44)
AST: 13 U/L — ABNORMAL LOW (ref 15–41)
Albumin: 2.4 g/dL — ABNORMAL LOW (ref 3.5–5.0)
Alkaline Phosphatase: 41 U/L (ref 38–126)
Anion gap: 11 (ref 5–15)
BUN: 19 mg/dL (ref 8–23)
CO2: 21 mmol/L — ABNORMAL LOW (ref 22–32)
Calcium: 8.1 mg/dL — ABNORMAL LOW (ref 8.9–10.3)
Chloride: 109 mmol/L (ref 98–111)
Creatinine, Ser: 1.4 mg/dL — ABNORMAL HIGH (ref 0.61–1.24)
GFR, Estimated: 52 mL/min — ABNORMAL LOW (ref >60)
Glucose, Bld: 155 mg/dL — ABNORMAL HIGH (ref 70–99)
Potassium: 4 mmol/L (ref 3.5–5.1)
Sodium: 141 mmol/L (ref 135–145)
Total Bilirubin: 0.8 mg/dL (ref 0.3–1.2)
Total Protein: 6.8 g/dL (ref 6.5–8.1)

## 2020-05-20 LAB — CBC
HCT: 27.8 % — ABNORMAL LOW (ref 39.0–52.0)
Hemoglobin: 9.2 g/dL — ABNORMAL LOW (ref 13.0–17.0)
MCH: 28.9 pg (ref 26.0–34.0)
MCHC: 33.1 g/dL (ref 30.0–36.0)
MCV: 87.4 fL (ref 80.0–100.0)
Platelets: 355 10*3/uL (ref 150–400)
RBC: 3.18 MIL/uL — ABNORMAL LOW (ref 4.22–5.81)
RDW: 18.9 % — ABNORMAL HIGH (ref 11.5–15.5)
WBC: 14.3 10*3/uL — ABNORMAL HIGH (ref 4.0–10.5)
nRBC: 0.3 % — ABNORMAL HIGH (ref 0.0–0.2)

## 2020-05-20 LAB — GLUCOSE, CAPILLARY
Glucose-Capillary: 151 mg/dL — ABNORMAL HIGH (ref 70–99)
Glucose-Capillary: 184 mg/dL — ABNORMAL HIGH (ref 70–99)
Glucose-Capillary: 171 mg/dL — ABNORMAL HIGH (ref 70–99)
Glucose-Capillary: 140 mg/dL — ABNORMAL HIGH (ref 70–99)

## 2020-05-20 NOTE — Progress Notes (Signed)
PROGRESS NOTE    Marco [name]  YCX:[id number] DOB: 1956/05/01 DOA: 05/12/2020 PCP: [name], MD    Brief Narrative:  64 yo AA male with PMH DMII, HTN, dCHF (EF most recently 50-55% in May, grade 1 DD), RA on biologic therapy.  Patient was admitted in April / May most recently. Had sepsis, hypotension at that time secondary to UTI with a R perinephric abscess (3.9 cm at that time). Drainage of perinephric abscess in late May grew out E.Coli sensitive to cefepime, rocephin.  Assessment & Plan:   Principal Problem:   Severe sepsis ([place]) Active Problems:   Elevated troponin   Atrial fibrillation (HCC)   Essential hypertension   Rheumatoid arthritis (HCC)   Type 2 diabetes mellitus with stage 3 chronic kidney disease, without long-term current use of insulin (HCC)   Chronic combined systolic and diastolic CHF (congestive heart failure) (HCC)   Acute renal failure superimposed on stage 3 chronic kidney disease (HCC)   Acute metabolic encephalopathy   Nephrolithiasis   Perinephric abscess   Pyohydronephrosis  Severe sepsis (HCC) -Presenting fever, tachycardia, tachypnea, leukocytosis, lactic acidosis, encephalopathy, source of infection considered urinary/perinephric abscess - Blood, urine, and perinephric abscess cultures growing E. Coli - abx was later de-escalated to rocephin monotherapy - PCT had been downtrending -Intermittent fevers, but afebrile this AM. WBC down to 14.3k -repeat cbc in AM  Perinephric abscess - right sided; seen on CT - s/p drain placement with IR on 10/3 - Continue with plan per above  - IR following. No evidence of active bleed on recent imagingl  Acute renal failure superimposed on stage 3 chronic kidney disease ([place]) -Unclear etiology, but possible contribution from outlet obstruction versus severe sepsis vs cardiorenal syndrome related to systolic failure below -Patient may still need left-sided percutaneous nephrostomy tube -IVF  held secondary to below acute chf exacerbation - Renal function continuing to improve with diuresis - Repeat bmet in AM  Elevated troponin -Patient has history of elevated troponins in setting of acute infections. This is considered due to demand. He has recurrent chest pain/shortness of breath which may also be due to ongoing fluid resuscitation from hypotension in setting of sepsis -Likely due to demand ischemia. Denies chest pain -Cont to monitor for now  Nephrolithiasis -CT reveals a 4 mm left-sided ureteral calculus.  -IVF currently on hold secondary to below -Urology continues to follow. If concern for worsening renal function or infection will need L PCN  Acute metabolic encephalopathy -Earlier encephalopathic -Mentation now appears at baseline -Seen by PT. No PT needs were identified  Hyperkalemia-resolved as of 05/14/2020 - continue tele - Potassium normal at 4.0 - Repeat bmet in AM   Acute on Chronic combined systolic and diastolic CHF (congestive heart failure) ([place]) -Recent CXR ordered and reviewed. Finding suggestive of vascular pulm congestion -IVF held. Pt started on scheduled IV lasix with good results -2d echo reviewed, findings of EF of 25-30% -Chart reviewed. Pt is well known to heart failure service as outpatient and noted to have variable EF heart failure with EF of 25% in 2018, more recently 50% several months ago. Pt is off entresto secondary to hypotension and off hydralazine. Pt is off Jardiance secondary to n/v.  -Work up was initially suggestive of TTR amyloidosis although later genetic testing was neg. Repeat PYP on 1/21 was read as equivocal although Cardiology felt it to be negative -Earlier discussed with Cardiology who agrees with diuresis as tolerated, agrees with titrating coreg as BP tolerates. Now  on 25mg  bid coreg -Later added low dose hydralazine. If BP tolerates, would then add nitrate later.  Type 2 diabetes mellitus with stage 3 chronic  kidney disease, without long-term current use of insulin (HCC) - continue SSI and CBG monitoring as needed -glucose overall stable  Rheumatoid arthritis ([place]) -On outpatient treatment -Continuing to hold secukinumab given active infection -was started on steroids as patient is on chronic prednisone 5 mg daily at home as well  Essential hypertension -continue 25mg  bid coreg per above -BP suboptimally controlled -On low dose hydralazine. If bp tolerates, then would add imdur  Atrial fibrillation (HCC) -Cont on beta blocker per above -Aspirin was on hold in case of need for further interventions, also given retroperitoneal bleed  DVT prophylaxis: SCD's Code Status: Full Family Communication: Pt in room  Status is: Inpatient  Remains inpatient appropriate because:Hemodynamically unstable, Ongoing diagnostic testing needed not appropriate for outpatient work up, Unsafe d/c plan and IV treatments appropriate due to intensity of illness or inability to take PO  Dispo: The patient is from: Home              Anticipated d/c is to: Unknown at this time. Will need PT eval when more medically stable              Anticipated d/c date is: > 3 days              Patient currently is not medically stable to d/c.   Consultants:   Urology  IR  Procedures:   Right perinephric abscess drain placement, 05/13/20  Antimicrobials: Anti-infectives (From admission, onward)   Start     Dose/Rate Route Frequency Ordered Stop   05/13/20 1800  cefTRIAXone (ROCEPHIN) 2 g in sodium chloride 0.9 % 100 mL IVPB        2 g 200 mL/hr over 30 Minutes Intravenous Every 24 hours 05/13/20 1748     05/13/20 1630  metroNIDAZOLE (FLAGYL) IVPB 500 mg  Status:  Discontinued        500 mg 100 mL/hr over 60 Minutes Intravenous Every 8 hours 05/13/20 1618 05/17/20 1348   05/13/20 1300  ceFEPIme (MAXIPIME) 2 g in sodium chloride 0.9 % 100 mL IVPB  Status:  Discontinued        2 g 200 mL/hr over 30 Minutes  Intravenous Every 12 hours 05/13/20 0353 05/13/20 1748   05/13/20 0400  ceFEPIme (MAXIPIME) 2 g in sodium chloride 0.9 % 100 mL IVPB  Status:  Discontinued        2 g 200 mL/hr over 30 Minutes Intravenous  Once 05/13/20 0347 05/13/20 0352   05/13/20 0015  vancomycin (VANCOREADY) IVPB 2000 mg/400 mL        2,000 mg 200 mL/hr over 120 Minutes Intravenous  Once 05/13/20 0004 05/13/20 0315   05/13/20 0000  ceFEPIme (MAXIPIME) 2 g in sodium chloride 0.9 % 100 mL IVPB        2 g 200 mL/hr over 30 Minutes Intravenous  Once 05/12/20 2356 05/13/20 0110   05/13/20 0000  metroNIDAZOLE (FLAGYL) IVPB 500 mg        500 mg 100 mL/hr over 60 Minutes Intravenous  Once 05/12/20 2356 05/13/20 0202   05/13/20 0000  vancomycin (VANCOCIN) IVPB 1000 mg/200 mL premix  Status:  Discontinued        1,000 mg 200 mL/hr over 60 Minutes Intravenous  Once 05/12/20 2356 05/13/20 0003      Subjective: Denies complaints this AM. Very eager to  go home soon  Objective: Vitals:   05/20/20 0700 05/20/20 0800 05/20/20 0900 05/20/20 1200  BP: (!) 172/66 (!) 144/66 132/67   Pulse: 85 79 80 75  Resp: 17 (!) 22 15 (!) 21  Temp: 100 F (37.8 C) 99.3 F (37.4 C) 99.1 F (37.3 C) 100 F (37.8 C)  TempSrc:      SpO2: 95% 92% 92% 98%  Weight:      Height:        Intake/Output Summary (Last 24 hours) at 05/20/2020 1338 Last data filed at 05/20/2020 1300 Gross per 24 hour  Intake 880 ml  Output 1205 ml  Net -325 ml   Filed Weights   05/18/20 0600 05/19/20 0455 05/20/20 0500  Weight: 108 kg 108.2 kg 109.6 kg    Examination: General exam: Conversant, in no acute distress Respiratory system: normal chest rise, clear, no audible wheezing Cardiovascular system: regular rhythm, s1-s2 Gastrointestinal system: Nondistended, nontender, pos BS Central nervous system: No seizures, no tremors Extremities: No cyanosis, no joint deformities Skin: No rashes, no pallor Psychiatry: Affect normal // no auditory  hallucinations   Data Reviewed: I have personally reviewed following labs and imaging studies  CBC: Recent Labs  Lab 05/15/20 1316 05/15/20 1316 05/15/20 1901 05/15/20 2342 05/16/20 0307 05/17/20 0251 05/18/20 0304 05/19/20 0245 05/20/20 0744  WBC 23.1*   < > 21.1*  --  19.3* 17.6* 17.4* 17.2* 14.3*  NEUTROABS 19.7*  --  17.0*  --  15.1* 12.2* 12.4*  --   --   HGB 8.0*   < > 8.0*   < > 7.3* 7.2* 6.6* 8.7* 9.2*  HCT 24.8*   < > 25.8*   < > 23.3* 23.0* 21.3* 27.1* 27.8*  MCV 87.9   < > 89.9  --  90.0 88.8 88.8 89.1 87.4  PLT 246   < > 245  --  244 262 259 291 355   < > = values in this interval not displayed.   Basic Metabolic Panel: Recent Labs  Lab 05/14/20 0509 05/14/20 0509 05/15/20 0055 05/15/20 0344 05/16/20 0307 05/17/20 0251 05/18/20 0304 05/19/20 0245 05/20/20 0744  NA 139   < > 142   < > 140 146* 139 139 141  K 4.5   < > 3.7   < > 3.4* 3.7 3.4* 3.6 4.0  CL 108   < > 104   < > 104 107 102 104 109  CO2 23   < > 22   < > 25 27 25 26  21*  GLUCOSE 154*   < > 230*   < > 161* 152* 183* 177* 155*  BUN 30*   < > 37*   < > 38* 33* 28* 24* 19  CREATININE 1.80*   < > 2.03*   < > 1.83* 1.73* 1.62* 1.54* 1.40*  CALCIUM 7.6*   < > 8.6*   < > 8.0* 8.4* 7.8* 7.7* 8.1*  MG 2.0  --  2.3  --  2.1 2.2 2.0  --   --   PHOS 3.3  --  3.5  --  2.9 2.5 2.4*  --   --    < > = values in this interval not displayed.   GFR: Estimated Creatinine Clearance: 61.1 mL/min (A) (by C-G formula based on SCr of 1.4 mg/dL (H)). Liver Function Tests: Recent Labs  Lab 05/15/20 0344 05/19/20 0245 05/20/20 0744  AST 14* 10* 13*  ALT 17 11 11   ALKPHOS 47 39 41  BILITOT  0.8 0.6 0.8  PROT 7.1 6.5 6.8  ALBUMIN 2.3* 2.3* 2.4*   No results for input(s): LIPASE, AMYLASE in the last 168 hours. No results for input(s): AMMONIA in the last 168 hours. Coagulation Profile: No results for input(s): INR, PROTIME in the last 168 hours. Cardiac Enzymes: No results for input(s): CKTOTAL, CKMB,  CKMBINDEX, TROPONINI in the last 168 hours. BNP (last 3 results) No results for input(s): PROBNP in the last 8760 hours. HbA1C: No results for input(s): HGBA1C in the last 72 hours. CBG: Recent Labs  Lab 05/19/20 1205 05/19/20 1622 05/19/20 2143 05/20/20 0733 05/20/20 1159  GLUCAP 229* 253* 185* 151* 184*   Lipid Profile: No results for input(s): CHOL, HDL, LDLCALC, TRIG, CHOLHDL, LDLDIRECT in the last 72 hours. Thyroid Function Tests: No results for input(s): TSH, T4TOTAL, FREET4, T3FREE, THYROIDAB in the last 72 hours. Anemia Panel: No results for input(s): VITAMINB12, FOLATE, FERRITIN, TIBC, IRON, RETICCTPCT in the last 72 hours. Sepsis Labs: Recent Labs  Lab 05/14/20 0509 05/15/20 0055 05/15/20 0344 05/15/20 1316 05/15/20 1901 05/15/20 2342 05/16/20 0307  PROCALCITON 11.37 7.46  --   --   --   --   --   LATICACIDVEN  --  4.5*   < > 3.0* 2.9* 2.3* 2.1*   < > = values in this interval not displayed.    Recent Results (from the past 240 hour(s))  Blood Culture (routine x 2)     Status: Abnormal   Collection Time: 05/13/20 12:10 AM   Specimen: BLOOD  Result Value Ref Range Status   Specimen Description   Final    BLOOD RIGHT ANTECUBITAL Performed at [facility] [address], [place], [place] [address]    Special Requests   Final    BOTTLES DRAWN AEROBIC AND ANAEROBIC Blood Culture adequate volume Performed at [facility] [address] [place], [place] [address]    Culture  Setup Time   Final    GRAM NEGATIVE RODS IN BOTH AEROBIC AND ANAEROBIC BOTTLES Organism ID to follow CRITICAL RESULT CALLED TO, READ BACK BY AND VERIFIED WITH: Shelda Jakes PHARMD, AT 1704 05/13/20 BY [facility] Performed at [facility], [place] [address], [place], [place] [address]    Culture ESCHERICHIA COLI (A)  Final   Report Status 05/15/2020 FINAL  Final   Organism ID, Bacteria ESCHERICHIA COLI  Final      Susceptibility   Escherichia  coli - MIC*    AMPICILLIN >=32 RESISTANT Resistant     CEFAZOLIN >=64 RESISTANT Resistant     CEFEPIME <=0.12 SENSITIVE Sensitive     CEFTAZIDIME <=1 SENSITIVE Sensitive     CEFTRIAXONE 1 SENSITIVE Sensitive     CIPROFLOXACIN <=0.25 SENSITIVE Sensitive     GENTAMICIN <=1 SENSITIVE Sensitive     IMIPENEM <=0.25 SENSITIVE Sensitive     TRIMETH/SULFA <=20 SENSITIVE Sensitive     AMPICILLIN/SULBACTAM >=32 RESISTANT Resistant     PIP/TAZO 8 SENSITIVE Sensitive     * ESCHERICHIA COLI  Blood Culture ID Panel (Reflexed)     Status: Abnormal   Collection Time: 05/13/20 12:10 AM  Result Value Ref Range Status   Enterococcus faecalis NOT DETECTED NOT DETECTED Final   Enterococcus Faecium NOT DETECTED NOT DETECTED Final   Listeria monocytogenes NOT DETECTED NOT DETECTED Final   Staphylococcus species NOT DETECTED NOT DETECTED Final   Staphylococcus aureus (BCID) NOT DETECTED NOT DETECTED Final   Staphylococcus epidermidis NOT DETECTED NOT DETECTED Final  Staphylococcus lugdunensis NOT DETECTED NOT DETECTED Final   Streptococcus species NOT DETECTED NOT DETECTED Final   Streptococcus agalactiae NOT DETECTED NOT DETECTED Final   Streptococcus pneumoniae NOT DETECTED NOT DETECTED Final   Streptococcus pyogenes NOT DETECTED NOT DETECTED Final   A.calcoaceticus-baumannii NOT DETECTED NOT DETECTED Final   Bacteroides fragilis NOT DETECTED NOT DETECTED Final   Enterobacterales DETECTED (A) NOT DETECTED Final    Comment: Enterobacterales represent a large order of gram negative bacteria, not a single organism. CRITICAL RESULT CALLED TO, READ BACK BY AND VERIFIED WITH: Shelda Jakes PHARMD, AT 1704 05/13/20 BY D. VANHOOK    Enterobacter cloacae complex NOT DETECTED NOT DETECTED Final   Escherichia coli DETECTED (A) NOT DETECTED Final    Comment: CRITICAL RESULT CALLED TO, READ BACK BY AND VERIFIED WITH: Shelda Jakes PHARMD, AT 1704 05/13/20 BY D. VANHOOK    Klebsiella aerogenes NOT DETECTED NOT DETECTED  Final   Klebsiella oxytoca NOT DETECTED NOT DETECTED Final   Klebsiella pneumoniae NOT DETECTED NOT DETECTED Final   Proteus species NOT DETECTED NOT DETECTED Final   Salmonella species NOT DETECTED NOT DETECTED Final   Serratia marcescens NOT DETECTED NOT DETECTED Final   Haemophilus influenzae NOT DETECTED NOT DETECTED Final   Neisseria meningitidis NOT DETECTED NOT DETECTED Final   Pseudomonas aeruginosa NOT DETECTED NOT DETECTED Final   Stenotrophomonas maltophilia NOT DETECTED NOT DETECTED Final   Candida albicans NOT DETECTED NOT DETECTED Final   Candida auris NOT DETECTED NOT DETECTED Final   Candida glabrata NOT DETECTED NOT DETECTED Final   Candida krusei NOT DETECTED NOT DETECTED Final   Candida parapsilosis NOT DETECTED NOT DETECTED Final   Candida tropicalis NOT DETECTED NOT DETECTED Final   Cryptococcus neoformans/gattii NOT DETECTED NOT DETECTED Final   CTX-M ESBL NOT DETECTED NOT DETECTED Final   Carbapenem resistance IMP NOT DETECTED NOT DETECTED Final   Carbapenem resistance KPC NOT DETECTED NOT DETECTED Final   Carbapenem resistance NDM NOT DETECTED NOT DETECTED Final   Carbapenem resist OXA 48 LIKE NOT DETECTED NOT DETECTED Final   Carbapenem resistance VIM NOT DETECTED NOT DETECTED Final    Comment: Performed at [facility], [address] [place], [place] [address]  Blood Culture (routine x 2)     Status: None   Collection Time: 05/13/20 12:54 AM   Specimen: BLOOD LEFT FOREARM  Result Value Ref Range Status   Specimen Description   Final    BLOOD LEFT FOREARM Performed at [facility] [address], [place], [place] [address]    Special Requests   Final    BOTTLES DRAWN AEROBIC AND ANAEROBIC Blood Culture results may not be optimal due to an excessive volume of blood received in culture bottles Performed at [facility] [address], [place], [place] [address]    Culture   Final    NO GROWTH 5 DAYS Performed  at [facility], [place] [address], [place], [place] [address]    Report Status 05/18/2020 FINAL  Final  Urine culture     Status: Abnormal   Collection Time: 05/13/20 12:54 AM   Specimen: In/Out Cath Urine  Result Value Ref Range Status   Specimen Description   Final    IN/OUT CATH URINE Performed at [facility] [address], [place],  [address]    Special Requests   Final    NONE Performed at [facility], [place] [address], [place],  [address]  Culture >=100,000 COLONIES/mL ESCHERICHIA COLI (A)  Final   Report Status 05/15/2020 FINAL  Final   Organism ID, Bacteria ESCHERICHIA COLI (A)  Final      Susceptibility   Escherichia coli - MIC*    AMPICILLIN >=32 RESISTANT Resistant     CEFAZOLIN 8 SENSITIVE Sensitive     CEFTRIAXONE 0.5 SENSITIVE Sensitive     CIPROFLOXACIN <=0.25 SENSITIVE Sensitive     GENTAMICIN <=1 SENSITIVE Sensitive     IMIPENEM <=0.25 SENSITIVE Sensitive     NITROFURANTOIN <=16 SENSITIVE Sensitive     TRIMETH/SULFA <=20 SENSITIVE Sensitive     AMPICILLIN/SULBACTAM >=32 RESISTANT Resistant     PIP/TAZO 8 SENSITIVE Sensitive     * >=100,000 COLONIES/mL ESCHERICHIA COLI  Respiratory Panel by RT PCR (Flu A&B, Covid) - Nasopharyngeal Swab     Status: None   Collection Time: 05/13/20  1:26 AM   Specimen: Nasopharyngeal Swab  Result Value Ref Range Status   SARS Coronavirus 2 by RT PCR NEGATIVE NEGATIVE Final    Comment: (NOTE) SARS-CoV-2 target nucleic acids are NOT DETECTED.  The SARS-CoV-2 RNA is generally detectable in upper respiratoy specimens during the acute phase of infection. The lowest concentration of SARS-CoV-2 viral copies this assay can detect is 131 copies/mL. A negative result does not preclude SARS-Cov-2 infection and should not be used as the sole basis for treatment or other patient management decisions. A negative result may occur with  improper specimen collection/handling,  submission of specimen other than nasopharyngeal swab, presence of viral mutation(s) within the areas targeted by this assay, and inadequate number of viral copies (<131 copies/mL). A negative result must be combined with clinical observations, patient history, and epidemiological information. The expected result is Negative.  Fact Sheet for Patients:  PinkCheek.be  Fact Sheet for Healthcare Providers:  GravelBags.it  This test is no t yet approved or cleared by the [place] FDA and  has been authorized for detection and/or diagnosis of SARS-CoV-2 by FDA under an Emergency Use Authorization (EUA). This EUA will remain  in effect (meaning this test can be used) for the duration of the COVID-19 declaration under Section 564(b)(1) of the Act, 21 U.S.C. section 360bbb-3(b)(1), unless the authorization is terminated or revoked sooner.     Influenza A by PCR NEGATIVE NEGATIVE Final   Influenza B by PCR NEGATIVE NEGATIVE Final    Comment: (NOTE) The Xpert Xpress SARS-CoV-2/FLU/RSV assay is intended as an aid in  the diagnosis of influenza from Nasopharyngeal swab specimens and  should not be used as a sole basis for treatment. Nasal washings and  aspirates are unacceptable for Xpert Xpress SARS-CoV-2/FLU/RSV  testing.  Fact Sheet for Patients: PinkCheek.be  Fact Sheet for Healthcare Providers: GravelBags.it  This test is not yet approved or cleared by the [place] FDA and  has been authorized for detection and/or diagnosis of SARS-CoV-2 by  FDA under an Emergency Use Authorization (EUA). This EUA will remain  in effect (meaning this test can be used) for the duration of the  Covid-19 declaration under Section 564(b)(1) of the Act, 21  U.S.C. section 360bbb-3(b)(1), unless the authorization is  terminated or revoked. Performed at [facility], [place] [address], [place],  [address]   Aerobic/Anaerobic Culture (surgical/deep wound)     Status: None   Collection Time: 05/13/20  6:20 AM   Specimen: Abdomen; Abscess  Result Value Ref Range Status   Specimen Description   Final    ABDOMEN Performed at [facility]  [place] [address], [place], [place] [address]    Special Requests   Final    NONE Performed at [facility], [place] [address] [place], [place] [address]    Gram Stain   Final    ABUNDANT WBC PRESENT, PREDOMINANTLY PMN MODERATE GRAM NEGATIVE RODS    Culture   Final    ABUNDANT ESCHERICHIA COLI NO ANAEROBES ISOLATED Performed at [facility], [place] [address] [place], [place] [address]    Report Status 05/18/2020 FINAL  Final   Organism ID, Bacteria ESCHERICHIA COLI  Final      Susceptibility   Escherichia coli - MIC*    AMPICILLIN >=32 RESISTANT Resistant     CEFAZOLIN 8 SENSITIVE Sensitive     CEFEPIME <=0.12 SENSITIVE Sensitive     CEFTAZIDIME <=1 SENSITIVE Sensitive     CEFTRIAXONE <=0.25 SENSITIVE Sensitive     CIPROFLOXACIN <=0.25 SENSITIVE Sensitive     GENTAMICIN <=1 SENSITIVE Sensitive     IMIPENEM <=0.25 SENSITIVE Sensitive     TRIMETH/SULFA <=20 SENSITIVE Sensitive     AMPICILLIN/SULBACTAM >=32 RESISTANT Resistant     PIP/TAZO 8 SENSITIVE Sensitive     * ABUNDANT ESCHERICHIA COLI  Culture, blood (routine x 2)     Status: None   Collection Time: 05/14/20  5:00 AM   Specimen: BLOOD  Result Value Ref Range Status   Specimen Description   Final    BLOOD RIGHT ANTECUBITAL Performed at [facility] [address], [place], [place] [address]    Special Requests   Final    BOTTLES DRAWN AEROBIC AND ANAEROBIC Blood Culture adequate volume Performed at [facility] [address], [place], [place] [address]    Culture   Final    NO GROWTH 5 DAYS Performed at [facility], [place] [address],  [place], [place] [address]    Report Status 05/19/2020 FINAL  Final  Culture, blood (routine x 2)     Status: None   Collection Time: 05/14/20  5:05 AM   Specimen: BLOOD  Result Value Ref Range Status   Specimen Description   Final    BLOOD RIGHT ANTECUBITAL Performed at [facility] [address], [place], [place] [address]    Special Requests   Final    BOTTLES DRAWN AEROBIC AND ANAEROBIC Blood Culture adequate volume Performed at [facility] [address] [place], [place] [address]    Culture   Final    NO GROWTH 5 DAYS Performed at [facility], [place] [address] [place], [place] [address]    Report Status 05/19/2020 FINAL  Final  MRSA PCR Screening     Status: Abnormal   Collection Time: 05/14/20  6:32 PM   Specimen: Nasopharyngeal  Result Value Ref Range Status   MRSA by PCR POSITIVE (A) NEGATIVE Final    Comment:        The GeneXpert MRSA Assay (FDA approved for NASAL specimens only), is one component of a comprehensive MRSA colonization surveillance program. It is not intended to diagnose MRSA infection nor to guide or monitor treatment for MRSA infections. RESULT CALLED TO, READ BACK BY AND VERIFIED WITH: Gavin Potters, K. RN @1028  ON 10.5.2021 BY [facility] Performed at [facility], [place] [address], [place],  [address]      Radiology Studies: CT ABDOMEN PELVIS WO CONTRAST  Result Date: 05/18/2020 CLINICAL DATA:  Retroperitoneal hemorrhage, retroperitoneal abscess EXAM:  CT ABDOMEN AND PELVIS WITHOUT CONTRAST TECHNIQUE: Multidetector CT imaging of the abdomen and pelvis was performed following the standard protocol without IV contrast. COMPARISON:  05/15/2020 FINDINGS: Lower chest: Trace left and small right pleural effusions are present, decreased in size since prior examination. Bibasilar atelectasis is again identified. Minimal coronary artery calcification. Global cardiac size within normal  limits. Hepatobiliary: Wedge like regions of a macroscopic fat within the left hepatic lobe are stable, and unchanged from prior MRI examination of 03/22/2020. Mild asymmetric parenchymal thickening and perihepatic inflammatory stranding adjacent to the drained pararenal abscess is again identified and appears stable since prior examination, not well assessed on this noncontrast examination. Vicarious excretion of contrast noted within the gallbladder lumen. The gallbladder is otherwise unremarkable. No intra or extrahepatic biliary ductal dilation. Pancreas: Unremarkable Spleen: Unremarkable Adrenals/Urinary Tract: The adrenal glands are unremarkable. Left kidney is normal in size and position. Exophytic renal cyst is again noted arising from the lower pole of the left kidney. Percutaneous drainage catheter is again seen coursing into the pararenal space adjacent to the upper pole of the right kidney. Large subcapsular hematoma is again identified involving the right kidney with infiltrative high density material compatible with blood extending throughout the pararenal space and layering along Gerota's fascia. This appears stable since prior examination. When measured in similar fashion, the hematoma measures 8.7 x 5.6 cm in dimension. No hydronephrosis. No intrarenal or ureteral calculi. The bladder is decompressed with a Foley catheter balloon seen within its lumen. Stomach/Bowel: Stomach, small bowel, are unremarkable. Surgical changes of right hemicolectomy are identified the residual large bowel is unremarkable save for mild scattered sigmoid diverticulosis. No free intraperitoneal gas. Trace free fluid within the pelvis is unchanged. Vascular/Lymphatic: Mild aortoiliac atherosclerotic calcification without evidence of aneurysm. No pathologic adenopathy within the abdomen and pelvis. Reproductive: Status post hysterectomy. No adnexal masses. Other: Three rectum unremarkable. Mild subcutaneous edema within the  flanks bilaterally is stable. Musculoskeletal: No lytic or blastic bone lesions are identified. IMPRESSION: Stable examination. Drainage catheter noted within the pararenal space adjacent to the upper pole the right kidney. Large subcapsular hematoma with hemorrhagic material seen infiltrating within the pararenal space unchanged from prior examination. Poorly delineated inflammatory changes involving the a posterior right hepatic lobe adjacent to the previously noted pararenal abscess is again identified and, while not well assessed on this noncontrast examination, appears stable. Electronically Signed   By: Fidela Salisbury MD   On: 05/18/2020 21:59    Scheduled Meds: . carvedilol  25 mg Oral BID WC  . Chlorhexidine Gluconate Cloth  6 each Topical Daily  . furosemide  40 mg Intravenous Q12H  . hydrALAZINE  10 mg Oral Q8H  . insulin aspart  0-9 Units Subcutaneous TID AC & HS  . predniSONE  5 mg Oral Q breakfast  . sodium chloride flush  5 mL Intracatheter Q8H   Continuous Infusions: . cefTRIAXone (ROCEPHIN)  IV Stopped (05/19/20 1852)     LOS: 7 days   Marylu Lund, MD [facility] Pager On Amion  If 7PM-7AM, please contact night-coverage 05/20/2020, 1:38 PM

## 2020-05-20 NOTE — Progress Notes (Addendum)
Subjective: Complains of abdominal pain, improved.  No nausea or emesis.  Passing flatus.  Urine is clear yellow in the urometer.  Objective: Vital signs in last 24 hours: Temp:  [98.4 F (36.9 C)-101.3 F (38.5 C)] 100 F (37.8 C) (10/10 0650) Pulse Rate:  [69-81] 81 (10/10 0650) Resp:  [4-25] 17 (10/10 0650) BP: (141-182)/(56-123) 175/77 (10/10 0650) SpO2:  [90 %-99 %] 96 % (10/10 0650) Weight:  [109.6 kg] 109.6 kg (10/10 0500)  Intake/Output from previous day: 10/09 0701 - 10/10 0700 In: 1000 [P.O.:300; IV Piggyback:100] Out: 6578 [IONGE:9528; Drains:5] Intake/Output this shift: No intake/output data recorded.   UOP: 1.9L clear yellow via foley R 70F pigtail in right perinephric abscess: 77ml ss  Physical Exam:  General: Alert and oriented CV: RRR Lungs: Clear Abdomen: Soft, ND, ATTP Ext: NT, No erythema  Lab Results: Recent Labs    05/18/20 0304 05/19/20 0245  HGB 6.6* 8.7*  HCT 21.3* 27.1*   BMET Recent Labs    05/18/20 0304 05/19/20 0245  NA 139 139  K 3.4* 3.6  CL 102 104  CO2 25 26  GLUCOSE 183* 177*  BUN 28* 24*  CREATININE 1.62* 1.54*  CALCIUM 7.8* 7.7*     Studies/Results: CT ABDOMEN PELVIS WO CONTRAST  Result Date: 05/18/2020 CLINICAL DATA:  Retroperitoneal hemorrhage, retroperitoneal abscess EXAM: CT ABDOMEN AND PELVIS WITHOUT CONTRAST TECHNIQUE: Multidetector CT imaging of the abdomen and pelvis was performed following the standard protocol without IV contrast. COMPARISON:  05/15/2020 FINDINGS: Lower chest: Trace left and small right pleural effusions are present, decreased in size since prior examination. Bibasilar atelectasis is again identified. Minimal coronary artery calcification. Global cardiac size within normal limits. Hepatobiliary: Wedge like regions of a macroscopic fat within the left hepatic lobe are stable, and unchanged from prior MRI examination of 03/22/2020. Mild asymmetric parenchymal thickening and perihepatic  inflammatory stranding adjacent to the drained pararenal abscess is again identified and appears stable since prior examination, not well assessed on this noncontrast examination. Vicarious excretion of contrast noted within the gallbladder lumen. The gallbladder is otherwise unremarkable. No intra or extrahepatic biliary ductal dilation. Pancreas: Unremarkable Spleen: Unremarkable Adrenals/Urinary Tract: The adrenal glands are unremarkable. Left kidney is normal in size and position. Exophytic renal cyst is again noted arising from the lower pole of the left kidney. Percutaneous drainage catheter is again seen coursing into the pararenal space adjacent to the upper pole of the right kidney. Large subcapsular hematoma is again identified involving the right kidney with infiltrative high density material compatible with blood extending throughout the pararenal space and layering along Gerota's fascia. This appears stable since prior examination. When measured in similar fashion, the hematoma measures 8.7 x 5.6 cm in dimension. No hydronephrosis. No intrarenal or ureteral calculi. The bladder is decompressed with a Foley catheter balloon seen within its lumen. Stomach/Bowel: Stomach, small bowel, are unremarkable. Surgical changes of right hemicolectomy are identified the residual large bowel is unremarkable save for mild scattered sigmoid diverticulosis. No free intraperitoneal gas. Trace free fluid within the pelvis is unchanged. Vascular/Lymphatic: Mild aortoiliac atherosclerotic calcification without evidence of aneurysm. No pathologic adenopathy within the abdomen and pelvis. Reproductive: Status post hysterectomy. No adnexal masses. Other: Three rectum unremarkable. Mild subcutaneous edema within the flanks bilaterally is stable. Musculoskeletal: No lytic or blastic bone lesions are identified. IMPRESSION: Stable examination. Drainage catheter noted within the pararenal space adjacent to the upper pole the right  kidney. Large subcapsular hematoma with hemorrhagic material seen infiltrating within the pararenal space  unchanged from prior examination. Poorly delineated inflammatory changes involving the a posterior right hepatic lobe adjacent to the previously noted pararenal abscess is again identified and, while not well assessed on this noncontrast examination, appears stable. Electronically Signed   By: Fidela Salisbury MD   On: 05/18/2020 21:59    Assessment/Plan: 1. Right perinephric abscess 2. Left 4 mm proximal ureteral calculus 3.  Urethral stricture  -CT A/P 10/8 stable examination, unchanged large right subcapsular hematoma -Hgb stable at 9.2 -No sign of active bleeding. -Regarding left ureteral stone, creatinine has been downtrending. 1.4 from 1.54. Continue observation for now.  If concern for infection or worsening renal function, we will proceed with left PCN. -Leave Foley catheter to gravity drainage for maximal decompression -Following along   LOS: 7 days   Janith Lima 05/20/2020, 7:01 AM [facility]  Pager: [phone]

## 2020-05-21 DIAGNOSIS — R6520 Severe sepsis without septic shock: Secondary | ICD-10-CM | POA: Diagnosis not present

## 2020-05-21 DIAGNOSIS — G9341 Metabolic encephalopathy: Secondary | ICD-10-CM | POA: Diagnosis not present

## 2020-05-21 DIAGNOSIS — A419 Sepsis, unspecified organism: Secondary | ICD-10-CM | POA: Diagnosis not present

## 2020-05-21 LAB — COMPREHENSIVE METABOLIC PANEL
ALT: 12 U/L (ref 0–44)
AST: 12 U/L — ABNORMAL LOW (ref 15–41)
Albumin: 2.5 g/dL — ABNORMAL LOW (ref 3.5–5.0)
Alkaline Phosphatase: 39 U/L (ref 38–126)
Anion gap: 11 (ref 5–15)
BUN: 19 mg/dL (ref 8–23)
CO2: 27 mmol/L (ref 22–32)
Calcium: 8.3 mg/dL — ABNORMAL LOW (ref 8.9–10.3)
Chloride: 104 mmol/L (ref 98–111)
Creatinine, Ser: 1.47 mg/dL — ABNORMAL HIGH (ref 0.61–1.24)
GFR, Estimated: 49 mL/min — ABNORMAL LOW (ref >60)
Glucose, Bld: 124 mg/dL — ABNORMAL HIGH (ref 70–99)
Potassium: 3.7 mmol/L (ref 3.5–5.1)
Sodium: 142 mmol/L (ref 135–145)
Total Bilirubin: 1.1 mg/dL (ref 0.3–1.2)
Total Protein: 6.8 g/dL (ref 6.5–8.1)

## 2020-05-21 LAB — CBC
HCT: 26.1 % — ABNORMAL LOW (ref 39.0–52.0)
Hemoglobin: 8 g/dL — ABNORMAL LOW (ref 13.0–17.0)
MCH: 28.5 pg (ref 26.0–34.0)
MCHC: 30.7 g/dL (ref 30.0–36.0)
MCV: 92.9 fL (ref 80.0–100.0)
Platelets: 287 10*3/uL (ref 150–400)
RBC: 2.81 MIL/uL — ABNORMAL LOW (ref 4.22–5.81)
RDW: 20.2 % — ABNORMAL HIGH (ref 11.5–15.5)
WBC: 13.5 10*3/uL — ABNORMAL HIGH (ref 4.0–10.5)
nRBC: 0.1 % (ref 0.0–0.2)

## 2020-05-21 LAB — GLUCOSE, CAPILLARY
Glucose-Capillary: 123 mg/dL — ABNORMAL HIGH (ref 70–99)
Glucose-Capillary: 242 mg/dL — ABNORMAL HIGH (ref 70–99)
Glucose-Capillary: 232 mg/dL — ABNORMAL HIGH (ref 70–99)

## 2020-05-21 LAB — MAGNESIUM: Magnesium: 1.8 mg/dL (ref 1.7–2.4)

## 2020-05-21 MED ORDER — CHLORHEXIDINE GLUCONATE CLOTH 2 % EX PADS: 6.0000 | MEDICATED_PAD | Freq: Every day | CUTANEOUS | Status: DC

## 2020-05-21 MED ORDER — ISOSORBIDE MONONITRATE ER 30 MG PO TB24
15.0000 mg | ORAL_TABLET | Freq: Every day | ORAL | Status: DC
  Administered 2020-05-21 – 2020-05-24 (×4): 15 mg via ORAL
  Filled 2020-05-21 (×4): qty 1

## 2020-05-21 MED ORDER — MUPIROCIN 2 % EX OINT: 1.0000 "application " | TOPICAL_OINTMENT | Freq: Two times a day (BID) | CUTANEOUS | Status: DC

## 2020-05-21 MED ORDER — ZOLPIDEM TARTRATE 5 MG PO TABS
5.0000 mg | ORAL_TABLET | Freq: Every evening | ORAL | Status: DC | PRN
  Administered 2020-05-21: 5 mg via ORAL
  Filled 2020-05-21: qty 1

## 2020-05-21 MED ORDER — FUROSEMIDE 10 MG/ML IJ SOLN
60.0000 mg | Freq: Two times a day (BID) | INTRAMUSCULAR | Status: DC
  Administered 2020-05-21 – 2020-05-24 (×6): 60 mg via INTRAVENOUS
  Filled 2020-05-21 (×6): qty 6

## 2020-05-21 MED ORDER — MAGNESIUM SULFATE 2 GM/50ML IV SOLN
2.0000 g | Freq: Once | INTRAVENOUS | Status: AC
  Administered 2020-05-21: 2 g via INTRAVENOUS
  Filled 2020-05-21: qty 50

## 2020-05-21 NOTE — Progress Notes (Signed)
PROGRESS NOTE    Marco [name]  OYD:[id number] DOB: 1955/12/26 DOA: 05/12/2020 PCP: [name], MD    Brief Narrative:  64 yo AA male with PMH DMII, HTN, dCHF (EF most recently 50-55% in May, grade 1 DD), RA on biologic therapy.  Patient was admitted in April / May most recently. Had sepsis, hypotension at that time secondary to UTI with a R perinephric abscess (3.9 cm at that time). Drainage of perinephric abscess in late May grew out E.Coli sensitive to cefepime, rocephin.  Assessment & Plan:   Principal Problem:   Severe sepsis ([place]) Active Problems:   Elevated troponin   Atrial fibrillation (HCC)   Essential hypertension   Rheumatoid arthritis (HCC)   Type 2 diabetes mellitus with stage 3 chronic kidney disease, without long-term current use of insulin (HCC)   Chronic combined systolic and diastolic CHF (congestive heart failure) (HCC)   Acute renal failure superimposed on stage 3 chronic kidney disease (HCC)   Acute metabolic encephalopathy   Nephrolithiasis   Perinephric abscess   Pyohydronephrosis  Severe sepsis (HCC) -Presenting fever, tachycardia, tachypnea, leukocytosis, lactic acidosis, encephalopathy, source of infection considered urinary/perinephric abscess - Blood, urine, and perinephric abscess cultures growing E. Coli - abx was later de-escalated to rocephin monotherapy - PCT had been downtrending -Intermittent fevers overnight, afebrile this AM. WBC down to down to 13.5k -Recheck CBC in AM -Given fevers, will repeat blood and urine culture  Perinephric abscess - right sided; seen on CT - s/p drain placement with IR on 10/3. This AM, 0cc output documented in chart - Continue with plan per above  - IR following. No evidence of active bleed was seen on recent imaging  Acute renal failure superimposed on stage 3 chronic kidney disease ([place]) -Unclear etiology, but possible contribution from outlet obstruction versus severe sepsis vs cardiorenal  syndrome related to systolic failure below -Patient may still need left-sided percutaneous nephrostomy tube -IVF held secondary to below acute chf exacerbation - Renal function stable thus far with diuresis - Repeat bmet in AM  Elevated troponin -Patient has history of elevated troponins in setting of acute infections. This is considered due to demand. He has recurrent chest pain/shortness of breath which may also be due to ongoing fluid resuscitation from hypotension in setting of sepsis -Likely due to demand ischemia. Denies chest pain -Cont to monitor for now  Nephrolithiasis -CT reveals a 4 mm left-sided ureteral calculus.  -IVF currently on hold secondary to below -Urology continues to follow. Per Nephrology, if there are concerns for worsening renal function or infection will need L PCN  Acute metabolic encephalopathy -Earlier encephalopathic -Mentation now appears at baseline -Seen by PT. No PT needs were identified  Hyperkalemia-resolved as of 05/14/2020 - continue tele - Potassium normal at 4.0 - Repeat bmet in AM   Acute on Chronic combined systolic and diastolic CHF (congestive heart failure) ([place]) -Recent CXR ordered and reviewed. Finding suggestive of vascular pulm congestion -IVF held. Pt started on scheduled IV lasix with good results -2d echo reviewed, findings of EF of 25-30% -Chart reviewed. Pt is well known to heart failure service as outpatient and noted to have variable EF heart failure with EF of 25% in 2018, more recently 50% several months ago. Pt is off entresto secondary to hypotension and off hydralazine. Pt is off Jardiance secondary to n/v.  -Work up was initially suggestive of TTR amyloidosis although later genetic testing was neg. Repeat PYP on 1/21 was read as equivocal although Cardiology  felt it to be negative -Had earlier discussed with Cardiology who agrees with current management -Now on low dose hydralazine. Will add imdur -BLE remain  edematous. Will increase lasix to 60mg  IV BID  Type 2 diabetes mellitus with stage 3 chronic kidney disease, without long-term current use of insulin (HCC) - continue SSI and CBG monitoring as needed -glucose overall stable  Rheumatoid arthritis ([place]) -Had been on outpatient treatment -Continuing to hold secukinumab given active infection -was started on steroids as patient is on chronic prednisone 5 mg daily at home as well  Essential hypertension -continue 25mg  bid coreg per above -BP suboptimally controlled -Currently on low dose hydralazine. Will add imdur. -Cont to titrate regimen as tolerated  Atrial fibrillation (HCC) -Cont on beta blocker per above -Aspirin was on hold in case of need for further interventions, also given retroperitoneal bleed  DVT prophylaxis: SCD's Code Status: Full Family Communication: Pt in room  Status is: Inpatient  Remains inpatient appropriate because:Hemodynamically unstable, Ongoing diagnostic testing needed not appropriate for outpatient work up, Unsafe d/c plan and IV treatments appropriate due to intensity of illness or inability to take PO  Dispo: The patient is from: Home              Anticipated d/c is to: Unknown at this time. Will need PT eval when more medically stable              Anticipated d/c date is: > 3 days              Patient currently is not medically stable to d/c.   Consultants:   Urology  IR  Procedures:   Right perinephric abscess drain placement, 05/13/20  Antimicrobials: Anti-infectives (From admission, onward)   Start     Dose/Rate Route Frequency Ordered Stop   05/13/20 1800  cefTRIAXone (ROCEPHIN) 2 g in sodium chloride 0.9 % 100 mL IVPB        2 g 200 mL/hr over 30 Minutes Intravenous Every 24 hours 05/13/20 1748     05/13/20 1630  metroNIDAZOLE (FLAGYL) IVPB 500 mg  Status:  Discontinued        500 mg 100 mL/hr over 60 Minutes Intravenous Every 8 hours 05/13/20 1618 05/17/20 1348   05/13/20  1300  ceFEPIme (MAXIPIME) 2 g in sodium chloride 0.9 % 100 mL IVPB  Status:  Discontinued        2 g 200 mL/hr over 30 Minutes Intravenous Every 12 hours 05/13/20 0353 05/13/20 1748   05/13/20 0400  ceFEPIme (MAXIPIME) 2 g in sodium chloride 0.9 % 100 mL IVPB  Status:  Discontinued        2 g 200 mL/hr over 30 Minutes Intravenous  Once 05/13/20 0347 05/13/20 0352   05/13/20 0015  vancomycin (VANCOREADY) IVPB 2000 mg/400 mL        2,000 mg 200 mL/hr over 120 Minutes Intravenous  Once 05/13/20 0004 05/13/20 0315   05/13/20 0000  ceFEPIme (MAXIPIME) 2 g in sodium chloride 0.9 % 100 mL IVPB        2 g 200 mL/hr over 30 Minutes Intravenous  Once 05/12/20 2356 05/13/20 0110   05/13/20 0000  metroNIDAZOLE (FLAGYL) IVPB 500 mg        500 mg 100 mL/hr over 60 Minutes Intravenous  Once 05/12/20 2356 05/13/20 0202   05/13/20 0000  vancomycin (VANCOCIN) IVPB 1000 mg/200 mL premix  Status:  Discontinued        1,000 mg 200 mL/hr over 60  Minutes Intravenous  Once 05/12/20 2356 05/13/20 0003      Subjective: Complains of continued LE edema  Objective: Vitals:   05/21/20 1000 05/21/20 1100 05/21/20 1114 05/21/20 1200  BP: (!) 157/75 (!) 175/78 (!) 175/78 (!) 161/74  Pulse: 72 74  76  Resp:  14  16  Temp: 99.7 F (37.6 C) 99.7 F (37.6 C)  99.5 F (37.5 C)  TempSrc:      SpO2: 90% 93%  93%  Weight:      Height:        Intake/Output Summary (Last 24 hours) at 05/21/2020 1229 Last data filed at 05/21/2020 0630 Gross per 24 hour  Intake 340 ml  Output 2895 ml  Net -2555 ml   Filed Weights   05/18/20 0600 05/19/20 0455 05/20/20 0500  Weight: 108 kg 108.2 kg 109.6 kg    Examination: General exam: Awake, laying in bed, in nad Respiratory system: Normal respiratory effort, no wheezing Cardiovascular system: regular rate, s1, s2 Gastrointestinal system: Soft, nondistended, positive BS Central nervous system: CN2-12 grossly intact, strength intact Extremities: Perfused, no clubbing,  BLE edema Skin: Normal skin turgor, no notable skin lesions seen Psychiatry: Mood normal // no visual hallucinations   Data Reviewed: I have personally reviewed following labs and imaging studies  CBC: Recent Labs  Lab 05/15/20 1316 05/15/20 1316 05/15/20 1901 05/15/20 2342 05/16/20 0307 05/16/20 0307 05/17/20 0251 05/18/20 0304 05/19/20 0245 05/20/20 0744 05/21/20 0314  WBC 23.1*   < > 21.1*  --  19.3*   < > 17.6* 17.4* 17.2* 14.3* 13.5*  NEUTROABS 19.7*  --  17.0*  --  15.1*  --  12.2* 12.4*  --   --   --   HGB 8.0*   < > 8.0*   < > 7.3*   < > 7.2* 6.6* 8.7* 9.2* 8.0*  HCT 24.8*   < > 25.8*   < > 23.3*   < > 23.0* 21.3* 27.1* 27.8* 26.1*  MCV 87.9   < > 89.9  --  90.0   < > 88.8 88.8 89.1 87.4 92.9  PLT 246   < > 245  --  244   < > 262 259 291 355 287   < > = values in this interval not displayed.   Basic Metabolic Panel: Recent Labs  Lab 05/15/20 0055 05/15/20 0344 05/16/20 0307 05/16/20 0307 05/17/20 0251 05/18/20 0304 05/19/20 0245 05/20/20 0744 05/21/20 0314  NA 142   < > 140   < > 146* 139 139 141 142  K 3.7   < > 3.4*   < > 3.7 3.4* 3.6 4.0 3.7  CL 104   < > 104   < > 107 102 104 109 104  CO2 22   < > 25   < > 27 25 26  21* 27  GLUCOSE 230*   < > 161*   < > 152* 183* 177* 155* 124*  BUN 37*   < > 38*   < > 33* 28* 24* 19 19  CREATININE 2.03*   < > 1.83*   < > 1.73* 1.62* 1.54* 1.40* 1.47*  CALCIUM 8.6*   < > 8.0*   < > 8.4* 7.8* 7.7* 8.1* 8.3*  MG 2.3  --  2.1  --  2.2 2.0  --   --  1.8  PHOS 3.5  --  2.9  --  2.5 2.4*  --   --   --    < > =  values in this interval not displayed.   GFR: Estimated Creatinine Clearance: 58.2 mL/min (A) (by C-G formula based on SCr of 1.47 mg/dL (H)). Liver Function Tests: Recent Labs  Lab 05/15/20 0344 05/19/20 0245 05/20/20 0744 05/21/20 0314  AST 14* 10* 13* 12*  ALT 17 11 11 12   ALKPHOS 47 39 41 39  BILITOT 0.8 0.6 0.8 1.1  PROT 7.1 6.5 6.8 6.8  ALBUMIN 2.3* 2.3* 2.4* 2.5*   No results for input(s): LIPASE,  AMYLASE in the last 168 hours. No results for input(s): AMMONIA in the last 168 hours. Coagulation Profile: No results for input(s): INR, PROTIME in the last 168 hours. Cardiac Enzymes: No results for input(s): CKTOTAL, CKMB, CKMBINDEX, TROPONINI in the last 168 hours. BNP (last 3 results) No results for input(s): PROBNP in the last 8760 hours. HbA1C: No results for input(s): HGBA1C in the last 72 hours. CBG: Recent Labs  Lab 05/20/20 0733 05/20/20 1159 05/20/20 1556 05/20/20 2007 05/21/20 0732  GLUCAP 151* 184* 171* 140* 123*   Lipid Profile: No results for input(s): CHOL, HDL, LDLCALC, TRIG, CHOLHDL, LDLDIRECT in the last 72 hours. Thyroid Function Tests: No results for input(s): TSH, T4TOTAL, FREET4, T3FREE, THYROIDAB in the last 72 hours. Anemia Panel: No results for input(s): VITAMINB12, FOLATE, FERRITIN, TIBC, IRON, RETICCTPCT in the last 72 hours. Sepsis Labs: Recent Labs  Lab 05/15/20 0055 05/15/20 0344 05/15/20 1316 05/15/20 1901 05/15/20 2342 05/16/20 0307  PROCALCITON 7.46  --   --   --   --   --   LATICACIDVEN 4.5*   < > 3.0* 2.9* 2.3* 2.1*   < > = values in this interval not displayed.    Recent Results (from the past 240 hour(s))  Blood Culture (routine x 2)     Status: Abnormal   Collection Time: 05/13/20 12:10 AM   Specimen: BLOOD  Result Value Ref Range Status   Specimen Description   Final    BLOOD RIGHT ANTECUBITAL Performed at [facility] [address], [place], [place] [address]    Special Requests   Final    BOTTLES DRAWN AEROBIC AND ANAEROBIC Blood Culture adequate volume Performed at [facility] [address], [place], [place] [address]    Culture  Setup Time   Final    GRAM NEGATIVE RODS IN BOTH AEROBIC AND ANAEROBIC BOTTLES Organism ID to follow CRITICAL RESULT CALLED TO, READ BACK BY AND VERIFIED WITH: Shelda Jakes PHARMD, AT 1704 05/13/20 BY [facility] Performed at [facility],  [place] [address], [place], [place] [address]    Culture ESCHERICHIA COLI (A)  Final   Report Status 05/15/2020 FINAL  Final   Organism ID, Bacteria ESCHERICHIA COLI  Final      Susceptibility   Escherichia coli - MIC*    AMPICILLIN >=32 RESISTANT Resistant     CEFAZOLIN >=64 RESISTANT Resistant     CEFEPIME <=0.12 SENSITIVE Sensitive     CEFTAZIDIME <=1 SENSITIVE Sensitive     CEFTRIAXONE 1 SENSITIVE Sensitive     CIPROFLOXACIN <=0.25 SENSITIVE Sensitive     GENTAMICIN <=1 SENSITIVE Sensitive     IMIPENEM <=0.25 SENSITIVE Sensitive     TRIMETH/SULFA <=20 SENSITIVE Sensitive     AMPICILLIN/SULBACTAM >=32 RESISTANT Resistant     PIP/TAZO 8 SENSITIVE Sensitive     * ESCHERICHIA COLI  Blood Culture ID Panel (Reflexed)     Status: Abnormal   Collection Time: 05/13/20 12:10 AM  Result Value Ref Range Status  Enterococcus faecalis NOT DETECTED NOT DETECTED Final   Enterococcus Faecium NOT DETECTED NOT DETECTED Final   Listeria monocytogenes NOT DETECTED NOT DETECTED Final   Staphylococcus species NOT DETECTED NOT DETECTED Final   Staphylococcus aureus (BCID) NOT DETECTED NOT DETECTED Final   Staphylococcus epidermidis NOT DETECTED NOT DETECTED Final   Staphylococcus lugdunensis NOT DETECTED NOT DETECTED Final   Streptococcus species NOT DETECTED NOT DETECTED Final   Streptococcus agalactiae NOT DETECTED NOT DETECTED Final   Streptococcus pneumoniae NOT DETECTED NOT DETECTED Final   Streptococcus pyogenes NOT DETECTED NOT DETECTED Final   A.calcoaceticus-baumannii NOT DETECTED NOT DETECTED Final   Bacteroides fragilis NOT DETECTED NOT DETECTED Final   Enterobacterales DETECTED (A) NOT DETECTED Final    Comment: Enterobacterales represent a large order of gram negative bacteria, not a single organism. CRITICAL RESULT CALLED TO, READ BACK BY AND VERIFIED WITH: Shelda Jakes PHARMD, AT 1704 05/13/20 BY D. VANHOOK    Enterobacter cloacae complex NOT DETECTED NOT DETECTED Final   Escherichia coli  DETECTED (A) NOT DETECTED Final    Comment: CRITICAL RESULT CALLED TO, READ BACK BY AND VERIFIED WITH: Shelda Jakes PHARMD, AT 1704 05/13/20 BY D. VANHOOK    Klebsiella aerogenes NOT DETECTED NOT DETECTED Final   Klebsiella oxytoca NOT DETECTED NOT DETECTED Final   Klebsiella pneumoniae NOT DETECTED NOT DETECTED Final   Proteus species NOT DETECTED NOT DETECTED Final   Salmonella species NOT DETECTED NOT DETECTED Final   Serratia marcescens NOT DETECTED NOT DETECTED Final   Haemophilus influenzae NOT DETECTED NOT DETECTED Final   Neisseria meningitidis NOT DETECTED NOT DETECTED Final   Pseudomonas aeruginosa NOT DETECTED NOT DETECTED Final   Stenotrophomonas maltophilia NOT DETECTED NOT DETECTED Final   Candida albicans NOT DETECTED NOT DETECTED Final   Candida auris NOT DETECTED NOT DETECTED Final   Candida glabrata NOT DETECTED NOT DETECTED Final   Candida krusei NOT DETECTED NOT DETECTED Final   Candida parapsilosis NOT DETECTED NOT DETECTED Final   Candida tropicalis NOT DETECTED NOT DETECTED Final   Cryptococcus neoformans/gattii NOT DETECTED NOT DETECTED Final   CTX-M ESBL NOT DETECTED NOT DETECTED Final   Carbapenem resistance IMP NOT DETECTED NOT DETECTED Final   Carbapenem resistance KPC NOT DETECTED NOT DETECTED Final   Carbapenem resistance NDM NOT DETECTED NOT DETECTED Final   Carbapenem resist OXA 48 LIKE NOT DETECTED NOT DETECTED Final   Carbapenem resistance VIM NOT DETECTED NOT DETECTED Final    Comment: Performed at [facility], [address] [place], [place] [address]  Blood Culture (routine x 2)     Status: None   Collection Time: 05/13/20 12:54 AM   Specimen: BLOOD LEFT FOREARM  Result Value Ref Range Status   Specimen Description   Final    BLOOD LEFT FOREARM Performed at [facility] [address], [place], [place] [address]    Special Requests   Final    BOTTLES DRAWN AEROBIC AND ANAEROBIC Blood Culture results may not be optimal  due to an excessive volume of blood received in culture bottles Performed at [facility] [address] [place], [place] [address]    Culture   Final    NO GROWTH 5 DAYS Performed at [facility], [place] [address] [place], [place] [address]    Report Status 05/18/2020 FINAL  Final  Urine culture     Status: Abnormal   Collection Time: 05/13/20 12:54 AM   Specimen: In/Out Cath Urine  Result  Value Ref Range Status   Specimen Description   Final    IN/OUT CATH URINE Performed at [facility] [address], [place], [place] [address]    Special Requests   Final    NONE Performed at [facility], [place] [address] [place], [place] [address]    Culture >=100,000 COLONIES/mL ESCHERICHIA COLI (A)  Final   Report Status 05/15/2020 FINAL  Final   Organism ID, Bacteria ESCHERICHIA COLI (A)  Final      Susceptibility   Escherichia coli - MIC*    AMPICILLIN >=32 RESISTANT Resistant     CEFAZOLIN 8 SENSITIVE Sensitive     CEFTRIAXONE 0.5 SENSITIVE Sensitive     CIPROFLOXACIN <=0.25 SENSITIVE Sensitive     GENTAMICIN <=1 SENSITIVE Sensitive     IMIPENEM <=0.25 SENSITIVE Sensitive     NITROFURANTOIN <=16 SENSITIVE Sensitive     TRIMETH/SULFA <=20 SENSITIVE Sensitive     AMPICILLIN/SULBACTAM >=32 RESISTANT Resistant     PIP/TAZO 8 SENSITIVE Sensitive     * >=100,000 COLONIES/mL ESCHERICHIA COLI  Respiratory Panel by RT PCR (Flu A&B, Covid) - Nasopharyngeal Swab     Status: None   Collection Time: 05/13/20  1:26 AM   Specimen: Nasopharyngeal Swab  Result Value Ref Range Status   SARS Coronavirus 2 by RT PCR NEGATIVE NEGATIVE Final    Comment: (NOTE) SARS-CoV-2 target nucleic acids are NOT DETECTED.  The SARS-CoV-2 RNA is generally detectable in upper respiratoy specimens during the acute phase of infection. The lowest concentration of SARS-CoV-2 viral copies this assay can detect is 131 copies/mL. A negative result does not  preclude SARS-Cov-2 infection and should not be used as the sole basis for treatment or other patient management decisions. A negative result may occur with  improper specimen collection/handling, submission of specimen other than nasopharyngeal swab, presence of viral mutation(s) within the areas targeted by this assay, and inadequate number of viral copies (<131 copies/mL). A negative result must be combined with clinical observations, patient history, and epidemiological information. The expected result is Negative.  Fact Sheet for Patients:  PinkCheek.be  Fact Sheet for Healthcare Providers:  GravelBags.it  This test is no t yet approved or cleared by the [place] FDA and  has been authorized for detection and/or diagnosis of SARS-CoV-2 by FDA under an Emergency Use Authorization (EUA). This EUA will remain  in effect (meaning this test can be used) for the duration of the COVID-19 declaration under Section 564(b)(1) of the Act, 21 U.S.C. section 360bbb-3(b)(1), unless the authorization is terminated or revoked sooner.     Influenza A by PCR NEGATIVE NEGATIVE Final   Influenza B by PCR NEGATIVE NEGATIVE Final    Comment: (NOTE) The Xpert Xpress SARS-CoV-2/FLU/RSV assay is intended as an aid in  the diagnosis of influenza from Nasopharyngeal swab specimens and  should not be used as a sole basis for treatment. Nasal washings and  aspirates are unacceptable for Xpert Xpress SARS-CoV-2/FLU/RSV  testing.  Fact Sheet for Patients: PinkCheek.be  Fact Sheet for Healthcare Providers: GravelBags.it  This test is not yet approved or cleared by the [place] FDA and  has been authorized for detection and/or diagnosis of SARS-CoV-2 by  FDA under an Emergency Use Authorization (EUA). This EUA will remain  in effect (meaning this test can be used) for the  duration of the  Covid-19 declaration under Section 564(b)(1) of the Act, 21  U.S.C. section 360bbb-3(b)(1), unless the authorization is  terminated or revoked.  Performed at [facility], [place] [address] [place], [place] [address]   Aerobic/Anaerobic Culture (surgical/deep wound)     Status: None   Collection Time: 05/13/20  6:20 AM   Specimen: Abdomen; Abscess  Result Value Ref Range Status   Specimen Description   Final    ABDOMEN Performed at [facility] [address] [place], [place] [address]    Special Requests   Final    NONE Performed at [facility], [place] [address] [place], [place] [address]    Gram Stain   Final    ABUNDANT WBC PRESENT, PREDOMINANTLY PMN MODERATE GRAM NEGATIVE RODS    Culture   Final    ABUNDANT ESCHERICHIA COLI NO ANAEROBES ISOLATED Performed at [facility], [place] [address] [place], [place] [address]    Report Status 05/18/2020 FINAL  Final   Organism ID, Bacteria ESCHERICHIA COLI  Final      Susceptibility   Escherichia coli - MIC*    AMPICILLIN >=32 RESISTANT Resistant     CEFAZOLIN 8 SENSITIVE Sensitive     CEFEPIME <=0.12 SENSITIVE Sensitive     CEFTAZIDIME <=1 SENSITIVE Sensitive     CEFTRIAXONE <=0.25 SENSITIVE Sensitive     CIPROFLOXACIN <=0.25 SENSITIVE Sensitive     GENTAMICIN <=1 SENSITIVE Sensitive     IMIPENEM <=0.25 SENSITIVE Sensitive     TRIMETH/SULFA <=20 SENSITIVE Sensitive     AMPICILLIN/SULBACTAM >=32 RESISTANT Resistant     PIP/TAZO 8 SENSITIVE Sensitive     * ABUNDANT ESCHERICHIA COLI  Culture, blood (routine x 2)     Status: None   Collection Time: 05/14/20  5:00 AM   Specimen: BLOOD  Result Value Ref Range Status   Specimen Description   Final    BLOOD RIGHT ANTECUBITAL Performed at [facility] [address], [place], [place] [address]    Special Requests   Final    BOTTLES DRAWN AEROBIC AND ANAEROBIC Blood Culture adequate  volume Performed at [facility] [address], [place], [place] [address]    Culture   Final    NO GROWTH 5 DAYS Performed at [facility], [place] [address], [place], [place] [address]    Report Status 05/19/2020 FINAL  Final  Culture, blood (routine x 2)     Status: None   Collection Time: 05/14/20  5:05 AM   Specimen: BLOOD  Result Value Ref Range Status   Specimen Description   Final    BLOOD RIGHT ANTECUBITAL Performed at [facility] [address] [place], [place] [address]    Special Requests   Final    BOTTLES DRAWN AEROBIC AND ANAEROBIC Blood Culture adequate volume Performed at [facility] [address] [place], [place] [address]    Culture   Final    NO GROWTH 5 DAYS Performed at [facility], [place] [address] [place], [place] [address]    Report Status 05/19/2020 FINAL  Final  MRSA PCR Screening     Status: Abnormal   Collection Time: 05/14/20  6:32 PM   Specimen: Nasopharyngeal  Result Value Ref Range Status   MRSA by PCR POSITIVE (A) NEGATIVE Final    Comment:        The GeneXpert MRSA Assay (FDA approved for NASAL specimens only), is one component of a comprehensive MRSA colonization surveillance program. It is not intended to diagnose MRSA infection nor to guide or monitor treatment  for MRSA infections. RESULT CALLED TO, READ BACK BY AND VERIFIED WITH: Gavin Potters, K. RN @1028  ON 10.5.2021 BY [facility] Performed at [facility], [place] [address] [place], [place] [address]      Radiology Studies: No results found.  Scheduled Meds: . carvedilol  25 mg Oral BID WC  . Chlorhexidine Gluconate Cloth  6 each Topical Daily  . furosemide  40 mg Intravenous Q12H  . hydrALAZINE  10 mg Oral Q8H  . insulin aspart  0-9 Units Subcutaneous TID AC & HS  . predniSONE  5 mg Oral Q breakfast  . sodium chloride flush  5 mL Intracatheter Q8H   Continuous Infusions: .  cefTRIAXone (ROCEPHIN)  IV Stopped (05/20/20 1802)  . magnesium sulfate bolus IVPB 2 g (05/21/20 1223)     LOS: 8 days   Marylu Lund, MD [facility] Pager On Amion  If 7PM-7AM, please contact night-coverage 05/21/2020, 12:29 PM

## 2020-05-21 NOTE — Progress Notes (Signed)
Referring Physician(s): Pace,M  Supervising Physician: Sandi Mariscal  Patient Status:  [facility] - In-pt  Chief Complaint:  Abdominal/flank pain  Subjective: Pt cont to have some abd /flank soreness but states not worsening; denies N/V   Allergies: Nsaids and Jardiance [empagliflozin]  Medications: Prior to Admission medications   Medication Sig Start Date End Date Taking? Authorizing Provider  albuterol (PROVENTIL HFA;VENTOLIN HFA) 108 (90 Base) MCG/ACT inhaler Inhale 2 puffs into the lungs every 6 (six) hours as needed for wheezing or shortness of breath. 04/29/18  Yes [name], MD  allopurinol (ZYLOPRIM) 100 MG tablet Take 200 mg by mouth every evening.    Yes [provider]  aspirin (EQ ASPIRIN ADULT LOW DOSE) 81 MG EC tablet TAKE 1 TABLET BY MOUTH ONCE DAILY. SWALLOW WHOLE Patient taking differently: Take 81 mg by mouth daily. TAKE 1 TABLET BY MOUTH ONCE DAILY. SWALLOW WHOLE 02/29/20  Yes [name], MD  atorvastatin (LIPITOR) 40 MG tablet Take 1 tablet (40 mg total) by mouth daily. 09/27/19  Yes Sueanne Margarita, MD  carvedilol (COREG) 12.5 MG tablet Take 1 tablet (12.5 mg total) by mouth 2 (two) times daily with a meal. 09/12/19  Yes Larey Dresser, MD  cyclobenzaprine (FLEXERIL) 5 MG tablet Take 5 mg by mouth every 12 (twelve) hours. 04/05/20  Yes [provider]  diclofenac Sodium (VOLTAREN) 1 % GEL Apply 4 g topically 4 (four) times daily. Patient taking differently: Apply 4 g topically 4 (four) times daily as needed (pain).  10/23/19  Yes Darr, Marguerita Beards, [name]  DULoxetine (CYMBALTA) 20 MG capsule TAKE 1 CAPSULE BY MOUTH ONCE DAILY Patient taking differently: Take 20 mg by mouth daily.  03/27/20  Yes [name], MD  famotidine (PEPCID) 20 MG tablet TAKE 1 TABLET BY MOUTH TWO TIMES DAILY Patient taking differently: Take 20 mg by mouth 2 (two) times daily.  04/18/20  Yes [name], MD  ferrous sulfate 325 (65 FE) MG tablet Take 1  tablet (325 mg total) by mouth 2 (two) times daily with a meal. 01/14/20  Yes Lavina Hamman, MD  furosemide (LASIX) 80 MG tablet Take 80 mg by mouth daily.    Yes [provider]  Insulin Lispro Prot & Lispro (HUMALOG MIX 75/25 KWIKPEN) (75-25) 100 UNIT/ML Kwikpen Inject 100 Units into the skin daily with breakfast. 12/20/19  Yes Renato Shin, MD  isosorbide mononitrate (IMDUR) 30 MG 24 hr tablet Take 30 mg by mouth daily. 01/19/20  Yes [provider]  naloxone [facility]) nasal spray 4 mg/0.1 mL Place in nostril in the event of suspected overdose on Narcotic medication (Oxycodone) 02/16/20  Yes [name], MD  oxyCODONE (OXY IR/ROXICODONE) 5 MG immediate release tablet Take 5 mg by mouth 3 (three) times daily as needed for moderate pain.  01/24/20  Yes [provider]  polyethylene glycol (MIRALAX / GLYCOLAX) 17 g packet Take 17 g by mouth daily as needed for moderate constipation or severe constipation. 01/14/20  Yes Lavina Hamman, MD  Potassium Chloride ER 20 MEQ TBCR Take 20 mEq by mouth daily.  01/15/20  Yes [provider]  predniSONE (DELTASONE) 5 MG tablet Take 5 mg by mouth daily. 11/23/19  Yes [provider]  pregabalin (LYRICA) 100 MG capsule Take 100 mg by mouth 3 (three) times daily.    Yes [provider]  ramelteon (ROZEREM) 8 MG tablet TAKE 1 TABLET BY MOUTH AT BEDTIME Patient taking differently: Take  8 mg by mouth at bedtime.  04/18/20  Yes [name], MD  Secukinumab (COSENTYX) 150 MG/ML SOSY Inject 150 mg into the skin every 28 (twenty-eight) days. Hold until told to resume by PCP 01/14/20  Yes Lavina Hamman, MD  Tafamidis [facility]) 61 MG CAPS Take 61 mg by mouth daily. 09/08/19  Yes Bensimhon, Shaune Pascal, MD  Accu-Chek FastClix Lancets MISC 1 each by Other route 2 (two) times daily. E11.42 03/27/20   Renato Shin, MD  ACCU-CHEK GUIDE test strip USE 1 STRIP TWICE DAILY TO CHECK BLOOD GLUCOSE 02/13/20   Renato Shin, MD    Insulin Pen Needle 32G X 4 MM MISC 1 each by Does not apply route daily. E11.9 03/06/20   Renato Shin, MD  sacubitril-valsartan (ENTRESTO) 24-26 MG Take 1 tablet by mouth 2 (two) times daily. 05/11/20   Bensimhon, Shaune Pascal, MD     Vital Signs: BP (!) 175/78   Pulse 74   Temp 99.7 F (37.6 C)   Resp 14   Ht 5\' 6"  (1.676 m)   Wt 241 lb 10 oz (109.6 kg)   SpO2 93%   BMI 39.00 kg/m   Physical Exam awake/alert; rt flank drain intact, insertion site mild-mod tender, OP minimal amt dark bloody fluid  Imaging: CT ABDOMEN PELVIS WO CONTRAST  Result Date: 05/18/2020 CLINICAL DATA:  Retroperitoneal hemorrhage, retroperitoneal abscess EXAM: CT ABDOMEN AND PELVIS WITHOUT CONTRAST TECHNIQUE: Multidetector CT imaging of the abdomen and pelvis was performed following the standard protocol without IV contrast. COMPARISON:  05/15/2020 FINDINGS: Lower chest: Trace left and small right pleural effusions are present, decreased in size since prior examination. Bibasilar atelectasis is again identified. Minimal coronary artery calcification. Global cardiac size within normal limits. Hepatobiliary: Wedge like regions of a macroscopic fat within the left hepatic lobe are stable, and unchanged from prior MRI examination of 03/22/2020. Mild asymmetric parenchymal thickening and perihepatic inflammatory stranding adjacent to the drained pararenal abscess is again identified and appears stable since prior examination, not well assessed on this noncontrast examination. Vicarious excretion of contrast noted within the gallbladder lumen. The gallbladder is otherwise unremarkable. No intra or extrahepatic biliary ductal dilation. Pancreas: Unremarkable Spleen: Unremarkable Adrenals/Urinary Tract: The adrenal glands are unremarkable. Left kidney is normal in size and position. Exophytic renal cyst is again noted arising from the lower pole of the left kidney. Percutaneous drainage catheter is again seen coursing into the  pararenal space adjacent to the upper pole of the right kidney. Large subcapsular hematoma is again identified involving the right kidney with infiltrative high density material compatible with blood extending throughout the pararenal space and layering along Gerota's fascia. This appears stable since prior examination. When measured in similar fashion, the hematoma measures 8.7 x 5.6 cm in dimension. No hydronephrosis. No intrarenal or ureteral calculi. The bladder is decompressed with a Foley catheter balloon seen within its lumen. Stomach/Bowel: Stomach, small bowel, are unremarkable. Surgical changes of right hemicolectomy are identified the residual large bowel is unremarkable save for mild scattered sigmoid diverticulosis. No free intraperitoneal gas. Trace free fluid within the pelvis is unchanged. Vascular/Lymphatic: Mild aortoiliac atherosclerotic calcification without evidence of aneurysm. No pathologic adenopathy within the abdomen and pelvis. Reproductive: Status post hysterectomy. No adnexal masses. Other: Three rectum unremarkable. Mild subcutaneous edema within the flanks bilaterally is stable. Musculoskeletal: No lytic or blastic bone lesions are identified. IMPRESSION: Stable examination. Drainage catheter noted within the pararenal space adjacent to the upper pole the right kidney. Large subcapsular hematoma with  hemorrhagic material seen infiltrating within the pararenal space unchanged from prior examination. Poorly delineated inflammatory changes involving the a posterior right hepatic lobe adjacent to the previously noted pararenal abscess is again identified and, while not well assessed on this noncontrast examination, appears stable. Electronically Signed   By: Fidela Salisbury MD   On: 05/18/2020 21:59   DG CHEST PORT 1 VIEW  Result Date: 05/17/2020 CLINICAL DATA:  Recent aspiration EXAM: PORTABLE CHEST 1 VIEW COMPARISON:  05/16/2020 FINDINGS: Cardiac shadow is enlarged but stable.  Tortuous thoracic aorta is seen. Mild thickening of the minor fissure is noted consistent with small right-sided pleural effusion. Drainage catheter is noted in the right upper quadrant. Previously seen bibasilar opacities have improved in the interval from the prior exam. IMPRESSION: Improved aeration in the bases bilaterally. No new focal abnormality is noted. Electronically Signed   By: Inez Catalina M.D.   On: 05/17/2020 20:12    Labs:  CBC: Recent Labs    05/18/20 0304 05/19/20 0245 05/20/20 0744 05/21/20 0314  WBC 17.4* 17.2* 14.3* 13.5*  HGB 6.6* 8.7* 9.2* 8.0*  HCT 21.3* 27.1* 27.8* 26.1*  PLT 259 291 355 287    COAGS: Recent Labs    12/08/19 2014 12/30/19 1106 05/13/20 0054 05/13/20 0700  INR 1.3* 1.2 1.3* 1.4*  APTT 33  --  31  --     BMP: Recent Labs    05/13/20 0700 05/13/20 0700 05/14/20 0509 05/14/20 0509 05/15/20 0055 05/15/20 0055 05/15/20 0344 05/15/20 1316 05/18/20 0304 05/19/20 0245 05/20/20 0744 05/21/20 0314  NA 139   < > 139   < > 142   < > 140   < > 139 139 141 142  K 5.4*   < > 4.5   < > 3.7   < > 3.6   < > 3.4* 3.6 4.0 3.7  CL 104   < > 108   < > 104   < > 104   < > 102 104 109 104  CO2 23   < > 23   < > 22   < > 22   < > 25 26 21* 27  GLUCOSE 162*   < > 154*   < > 230*   < > 296*   < > 183* 177* 155* 124*  BUN 31*   < > 30*   < > 37*   < > 39*   < > 28* 24* 19 19  CALCIUM 7.9*   < > 7.6*   < > 8.6*   < > 8.3*   < > 7.8* 7.7* 8.1* 8.3*  CREATININE 2.46*   < > 1.80*   < > 2.03*   < > 2.06*   < > 1.62* 1.54* 1.40* 1.47*  GFRNONAA 26*   < > 39*   < > 33*   < > 33*   < > 44* 47* 52* 49*  GFRAA 31*  --  45*  --  39*  --  38*  --   --   --   --   --    < > = values in this interval not displayed.    LIVER FUNCTION TESTS: Recent Labs    05/15/20 0344 05/19/20 0245 05/20/20 0744 05/21/20 0314  BILITOT 0.8 0.6 0.8 1.1  AST 14* 10* 13* 12*  ALT 17 11 11 12   ALKPHOS 47 39 41 39  PROT 7.1 6.5 6.8 6.8  ALBUMIN 2.3* 2.3* 2.4* 2.5*  Assessment and Plan: Patient with history of recurrent right perinephric abscess; status post drain placement on 10/3; also with subcapsular/pararenal hematoma; temp 99.7; drain OP minimal; WBC 13.5(14.3), hgb 8(9.2), creat 1.47(1.40); drain fl cx- e coli; maintain current drain for now; await f/u culture results; left PCN (if needed) would be technically challenging given minimal hydro on imaging; if drain output continues to be minimal will need to discuss possible removal with urology   Electronically Signed: D. Rowe Robert, [name] 05/21/2020, 11:15 AM   I spent a total of 15 minutes at the the patient's bedside AND on the patient's hospital floor or unit, greater than 50% of which was counseling/coordinating care for right perinephric abscess drain    Patient ID: Marco [name], male   DOB: [birth year], 64 y.o.   MRN: [id number]

## 2020-05-21 NOTE — Progress Notes (Signed)
PT Cancellation Note  Patient Details Name: Marco [name] MRN: [id number] DOB: 09/15/1955   Cancelled Treatment:    Reason Eval/Treat Not Completed: Patient declined, attempted PT x 2 today. Patient declined. This PM reports abdominal pain. Will check back another day.  [name] 05/21/2020, 3:52 PM  [place] Pager [phone] Office [phone]

## 2020-05-21 NOTE — TOC Progression Note (Signed)
Transition of Care [facility]) - Progression Note    Patient Details  Name: DESSIE DELCARLO MRN: [id number] Date of Birth: [birth year]  Transition of Care [facility] CM/SW Contact  Leeroy Cha, RN Phone Number: 05/21/2020, 7:47 AM  Clinical Narrative:    -Presenting fever, tachycardia, tachypnea,leukocytosis,lactic acidosis, encephalopathy, source of infection considered urinary/perinephric abscess - Blood, urine, and perinephric abscess cultures growing E. Coli - abx was later de-escalated to rocephin monotherapy - PCT had been downtrending -Intermittent fevers, but afebrile this AM. WBC down to 14.3k -repeat cbc in AM  Perinephric abscess - right sided; seen on CT - s/p drain placement with IR on 10/3 - Continue with plan per above - IR following. No evidence of active bleed on recent imagingl Temp 101.1, iv rocephin.  Following for progression-possible transfer to med-surg floor? Following for poss. hhc needs.  Expected Discharge Plan: Home/Self Care Barriers to Discharge: Continued Medical Work up  Expected Discharge Plan and Services Expected Discharge Plan: Home/Self Care   Discharge Planning Services: CM Consult   Living arrangements for the past 2 months: Single Family Home                                       Social Determinants of Health (SDOH) Interventions    Readmission Risk Interventions Readmission Risk Prevention Plan 12/28/2019  Transportation Screening Complete  PCP or Specialist Appt within 3-5 Days Complete  HRI or [place] Complete  Social Work Consult for [place] Planning/Counseling Complete  Palliative Care Screening Not Applicable  Medication Review [employer] photographer) Complete  Some recent data might be hidden

## 2020-05-22 ENCOUNTER — Inpatient Hospital Stay (HOSPITAL_COMMUNITY): Payer: Medicare Other

## 2020-05-22 DIAGNOSIS — A419 Sepsis, unspecified organism: Secondary | ICD-10-CM | POA: Diagnosis not present

## 2020-05-22 DIAGNOSIS — G9341 Metabolic encephalopathy: Secondary | ICD-10-CM | POA: Diagnosis not present

## 2020-05-22 DIAGNOSIS — R6520 Severe sepsis without septic shock: Secondary | ICD-10-CM | POA: Diagnosis not present

## 2020-05-22 DIAGNOSIS — N179 Acute kidney failure, unspecified: Secondary | ICD-10-CM | POA: Diagnosis not present

## 2020-05-22 LAB — CBC
HCT: 27.8 % — ABNORMAL LOW (ref 39.0–52.0)
Hemoglobin: 8.7 g/dL — ABNORMAL LOW (ref 13.0–17.0)
MCH: 28.6 pg (ref 26.0–34.0)
MCHC: 31.3 g/dL (ref 30.0–36.0)
MCV: 91.4 fL (ref 80.0–100.0)
Platelets: 396 10*3/uL (ref 150–400)
RBC: 3.04 MIL/uL — ABNORMAL LOW (ref 4.22–5.81)
RDW: 20.4 % — ABNORMAL HIGH (ref 11.5–15.5)
WBC: 15.8 10*3/uL — ABNORMAL HIGH (ref 4.0–10.5)
nRBC: 0 % (ref 0.0–0.2)

## 2020-05-22 LAB — COMPREHENSIVE METABOLIC PANEL
ALT: 13 U/L (ref 0–44)
AST: 15 U/L (ref 15–41)
Albumin: 2.6 g/dL — ABNORMAL LOW (ref 3.5–5.0)
Alkaline Phosphatase: 45 U/L (ref 38–126)
Anion gap: 13 (ref 5–15)
BUN: 17 mg/dL (ref 8–23)
CO2: 28 mmol/L (ref 22–32)
Calcium: 8.2 mg/dL — ABNORMAL LOW (ref 8.9–10.3)
Chloride: 102 mmol/L (ref 98–111)
Creatinine, Ser: 1.51 mg/dL — ABNORMAL HIGH (ref 0.61–1.24)
GFR, Estimated: 48 mL/min — ABNORMAL LOW (ref >60)
Glucose, Bld: 161 mg/dL — ABNORMAL HIGH (ref 70–99)
Potassium: 3.5 mmol/L (ref 3.5–5.1)
Sodium: 143 mmol/L (ref 135–145)
Total Bilirubin: 0.7 mg/dL (ref 0.3–1.2)
Total Protein: 7 g/dL (ref 6.5–8.1)

## 2020-05-22 LAB — MAGNESIUM: Magnesium: 2.1 mg/dL (ref 1.7–2.4)

## 2020-05-22 LAB — URINE CULTURE: Culture: NO GROWTH

## 2020-05-22 LAB — GLUCOSE, CAPILLARY: Glucose-Capillary: 165 mg/dL — ABNORMAL HIGH (ref 70–99)

## 2020-05-22 MED ORDER — POTASSIUM CHLORIDE CRYS ER 20 MEQ PO TBCR
40.0000 meq | EXTENDED_RELEASE_TABLET | Freq: Once | ORAL | Status: AC
  Administered 2020-05-22: 40 meq via ORAL
  Filled 2020-05-22: qty 2

## 2020-05-22 MED ORDER — ADULT MULTIVITAMIN W/MINERALS CH
1.0000 | ORAL_TABLET | Freq: Every day | ORAL | Status: DC
  Administered 2020-05-22 – 2020-05-24 (×3): 1 via ORAL
  Filled 2020-05-22 (×3): qty 1

## 2020-05-22 MED ORDER — INSULIN GLARGINE 100 UNIT/ML ~~LOC~~ SOLN
5.0000 [IU] | Freq: Every day | SUBCUTANEOUS | Status: DC
  Administered 2020-05-22 – 2020-05-24 (×3): 5 [IU] via SUBCUTANEOUS
  Filled 2020-05-22 (×4): qty 0.05

## 2020-05-22 NOTE — Progress Notes (Signed)
  Subjective: No overnight events.  Pt in bedside chair this AM.  States he's feeling better and wants to go home.   Objective: Vital signs in last 24 hours: Temp:  [99.1 F (37.3 C)-100.8 F (38.2 C)] 99.9 F (37.7 C) (10/12 0615) Pulse Rate:  [58-82] 75 (10/12 0615) Resp:  [13-29] 20 (10/12 0500) BP: (127-187)/(58-80) 131/58 (10/12 0615) SpO2:  [90 %-96 %] 93 % (10/12 0615) FiO2 (%):  [21 %] 21 % (10/12 0000) Weight:  [107.6 kg] 107.6 kg (10/12 0355)  Intake/Output from previous day: 10/11 0701 - 10/12 0700 In: 150 [IV Piggyback:150] Out: 2750 [Urine:2750] Intake/Output this shift: Total I/O In: -  Out: 1250 [Urine:1250]  Physical Exam:  General: Alert and oriented CV: RRR Lungs: Clear Abdomen: Soft, ND, RUQ ttp Ext: NT  Lab Results: Recent Labs    05/20/20 0744 05/21/20 0314 05/22/20 0310  HGB 9.2* 8.0* 8.7*  HCT 27.8* 26.1* 27.8*   BMET Recent Labs    05/21/20 0314 05/22/20 0310  NA 142 143  K 3.7 3.5  CL 104 102  CO2 27 28  GLUCOSE 124* 161*  BUN 19 17  CREATININE 1.47* 1.51*  CALCIUM 8.3* 8.2*     Studies/Results: No results found.  Assessment/Plan: 1. Right perinephric abscess/subcapsular hematoma: Hemoglobin stable and repeat CT unchanged 2. Left 4 mm proximal ureteral calculus: No hydronephrosis noted on repeat CT scan however creatinine bumped this morning as well as white count, if no other source consider left PCN tube 3.Urethral stricture: now that patient is more ambulatory and alert can have void trial     LOS: 9 days   Lemon Sternberg D Alister Staver 05/22/2020, 6:58 AM

## 2020-05-22 NOTE — Progress Notes (Signed)
Referring Physician(s): Pace,M  Supervising Physician: Arne Cleveland  Patient Status:  [facility] - In-pt  Chief Complaint:  Abdominal/flank pain/abscess  Subjective: Pt doing ok ; denies chills, worsening abd /flank pain,N/V; asking about going home   Allergies: Nsaids and Jardiance [empagliflozin]  Medications: Prior to Admission medications   Medication Sig Start Date End Date Taking? Authorizing Provider  albuterol (PROVENTIL HFA;VENTOLIN HFA) 108 (90 Base) MCG/ACT inhaler Inhale 2 puffs into the lungs every 6 (six) hours as needed for wheezing or shortness of breath. 04/29/18  Yes [name], MD  allopurinol (ZYLOPRIM) 100 MG tablet Take 200 mg by mouth every evening.    Yes [provider]  aspirin (EQ ASPIRIN ADULT LOW DOSE) 81 MG EC tablet TAKE 1 TABLET BY MOUTH ONCE DAILY. SWALLOW WHOLE Patient taking differently: Take 81 mg by mouth daily. TAKE 1 TABLET BY MOUTH ONCE DAILY. SWALLOW WHOLE 02/29/20  Yes [name], MD  atorvastatin (LIPITOR) 40 MG tablet Take 1 tablet (40 mg total) by mouth daily. 09/27/19  Yes Sueanne Margarita, MD  carvedilol (COREG) 12.5 MG tablet Take 1 tablet (12.5 mg total) by mouth 2 (two) times daily with a meal. 09/12/19  Yes Larey Dresser, MD  cyclobenzaprine (FLEXERIL) 5 MG tablet Take 5 mg by mouth every 12 (twelve) hours. 04/05/20  Yes [provider]  diclofenac Sodium (VOLTAREN) 1 % GEL Apply 4 g topically 4 (four) times daily. Patient taking differently: Apply 4 g topically 4 (four) times daily as needed (pain).  10/23/19  Yes Darr, Marguerita Beards, [name]  DULoxetine (CYMBALTA) 20 MG capsule TAKE 1 CAPSULE BY MOUTH ONCE DAILY Patient taking differently: Take 20 mg by mouth daily.  03/27/20  Yes [name], MD  famotidine (PEPCID) 20 MG tablet TAKE 1 TABLET BY MOUTH TWO TIMES DAILY Patient taking differently: Take 20 mg by mouth 2 (two) times daily.  04/18/20  Yes [name], MD  ferrous sulfate 325 (65 FE) MG  tablet Take 1 tablet (325 mg total) by mouth 2 (two) times daily with a meal. 01/14/20  Yes Lavina Hamman, MD  furosemide (LASIX) 80 MG tablet Take 80 mg by mouth daily.    Yes [provider]  Insulin Lispro Prot & Lispro (HUMALOG MIX 75/25 KWIKPEN) (75-25) 100 UNIT/ML Kwikpen Inject 100 Units into the skin daily with breakfast. 12/20/19  Yes Renato Shin, MD  isosorbide mononitrate (IMDUR) 30 MG 24 hr tablet Take 30 mg by mouth daily. 01/19/20  Yes [provider]  naloxone [facility]) nasal spray 4 mg/0.1 mL Place in nostril in the event of suspected overdose on Narcotic medication (Oxycodone) 02/16/20  Yes [name], MD  oxyCODONE (OXY IR/ROXICODONE) 5 MG immediate release tablet Take 5 mg by mouth 3 (three) times daily as needed for moderate pain.  01/24/20  Yes [provider]  polyethylene glycol (MIRALAX / GLYCOLAX) 17 g packet Take 17 g by mouth daily as needed for moderate constipation or severe constipation. 01/14/20  Yes Lavina Hamman, MD  Potassium Chloride ER 20 MEQ TBCR Take 20 mEq by mouth daily.  01/15/20  Yes [provider]  predniSONE (DELTASONE) 5 MG tablet Take 5 mg by mouth daily. 11/23/19  Yes [provider]  pregabalin (LYRICA) 100 MG capsule Take 100 mg by mouth 3 (three) times daily.    Yes [provider]  ramelteon (ROZEREM) 8 MG tablet TAKE 1 TABLET BY MOUTH AT BEDTIME Patient taking differently: Take  8 mg by mouth at bedtime.  04/18/20  Yes [name], MD  Secukinumab (COSENTYX) 150 MG/ML SOSY Inject 150 mg into the skin every 28 (twenty-eight) days. Hold until told to resume by PCP 01/14/20  Yes Lavina Hamman, MD  Tafamidis [facility]) 61 MG CAPS Take 61 mg by mouth daily. 09/08/19  Yes Bensimhon, Shaune Pascal, MD  Accu-Chek FastClix Lancets MISC 1 each by Other route 2 (two) times daily. E11.42 03/27/20   Renato Shin, MD  ACCU-CHEK GUIDE test strip USE 1 STRIP TWICE DAILY TO CHECK BLOOD GLUCOSE 02/13/20   Renato Shin, MD  Insulin Pen Needle 32G X 4 MM MISC 1 each by Does not apply route daily. E11.9 03/06/20   Renato Shin, MD  sacubitril-valsartan (ENTRESTO) 24-26 MG Take 1 tablet by mouth 2 (two) times daily. 05/11/20   Bensimhon, Shaune Pascal, MD     Vital Signs: BP (!) 147/80   Pulse 73   Temp 99.5 F (37.5 C)   Resp 15   Ht 5\' 6"  (1.676 m)   Wt 237 lb 3.4 oz (107.6 kg)   SpO2 96%   BMI 38.29 kg/m   Physical Exam awake/alert; rt flank drain intact, insertion site NT, small amount of dark blood in JP bulb; foley draining yellow urine  Imaging: CT ABDOMEN PELVIS WO CONTRAST  Result Date: 05/18/2020 CLINICAL DATA:  Retroperitoneal hemorrhage, retroperitoneal abscess EXAM: CT ABDOMEN AND PELVIS WITHOUT CONTRAST TECHNIQUE: Multidetector CT imaging of the abdomen and pelvis was performed following the standard protocol without IV contrast. COMPARISON:  05/15/2020 FINDINGS: Lower chest: Trace left and small right pleural effusions are present, decreased in size since prior examination. Bibasilar atelectasis is again identified. Minimal coronary artery calcification. Global cardiac size within normal limits. Hepatobiliary: Wedge like regions of a macroscopic fat within the left hepatic lobe are stable, and unchanged from prior MRI examination of 03/22/2020. Mild asymmetric parenchymal thickening and perihepatic inflammatory stranding adjacent to the drained pararenal abscess is again identified and appears stable since prior examination, not well assessed on this noncontrast examination. Vicarious excretion of contrast noted within the gallbladder lumen. The gallbladder is otherwise unremarkable. No intra or extrahepatic biliary ductal dilation. Pancreas: Unremarkable Spleen: Unremarkable Adrenals/Urinary Tract: The adrenal glands are unremarkable. Left kidney is normal in size and position. Exophytic renal cyst is again noted arising from the lower pole of the left kidney. Percutaneous drainage catheter is  again seen coursing into the pararenal space adjacent to the upper pole of the right kidney. Large subcapsular hematoma is again identified involving the right kidney with infiltrative high density material compatible with blood extending throughout the pararenal space and layering along Gerota's fascia. This appears stable since prior examination. When measured in similar fashion, the hematoma measures 8.7 x 5.6 cm in dimension. No hydronephrosis. No intrarenal or ureteral calculi. The bladder is decompressed with a Foley catheter balloon seen within its lumen. Stomach/Bowel: Stomach, small bowel, are unremarkable. Surgical changes of right hemicolectomy are identified the residual large bowel is unremarkable save for mild scattered sigmoid diverticulosis. No free intraperitoneal gas. Trace free fluid within the pelvis is unchanged. Vascular/Lymphatic: Mild aortoiliac atherosclerotic calcification without evidence of aneurysm. No pathologic adenopathy within the abdomen and pelvis. Reproductive: Status post hysterectomy. No adnexal masses. Other: Three rectum unremarkable. Mild subcutaneous edema within the flanks bilaterally is stable. Musculoskeletal: No lytic or blastic bone lesions are identified. IMPRESSION: Stable examination. Drainage catheter noted within the pararenal space adjacent to the upper pole the right kidney.  Large subcapsular hematoma with hemorrhagic material seen infiltrating within the pararenal space unchanged from prior examination. Poorly delineated inflammatory changes involving the a posterior right hepatic lobe adjacent to the previously noted pararenal abscess is again identified and, while not well assessed on this noncontrast examination, appears stable. Electronically Signed   By: Fidela Salisbury MD   On: 05/18/2020 21:59   DG CHEST PORT 1 VIEW  Result Date: 05/22/2020 CLINICAL DATA:  Fever EXAM: PORTABLE CHEST 1 VIEW COMPARISON:  May 17, 2020 FINDINGS: There is atelectatic  change in the region of the right minor fissure. Lungs elsewhere are clear. Heart is borderline enlarged with pulmonary vascularity normal. No adenopathy. No bone lesions. Occasional foci of carotid artery calcification noted bilaterally. IMPRESSION: Atelectatic change right minor fissure region. Lungs otherwise clear. Stable cardiac silhouette. Calcification noted in each carotid artery. Electronically Signed   By: Lowella Grip III M.D.   On: 05/22/2020 09:13    Labs:  CBC: Recent Labs    05/19/20 0245 05/20/20 0744 05/21/20 0314 05/22/20 0310  WBC 17.2* 14.3* 13.5* 15.8*  HGB 8.7* 9.2* 8.0* 8.7*  HCT 27.1* 27.8* 26.1* 27.8*  PLT 291 355 287 396    COAGS: Recent Labs    12/08/19 2014 12/30/19 1106 05/13/20 0054 05/13/20 0700  INR 1.3* 1.2 1.3* 1.4*  APTT 33  --  31  --     BMP: Recent Labs    05/13/20 0700 05/13/20 0700 05/14/20 0509 05/14/20 0509 05/15/20 0055 05/15/20 0055 05/15/20 0344 05/15/20 1316 05/19/20 0245 05/20/20 0744 05/21/20 0314 05/22/20 0310  NA 139   < > 139   < > 142   < > 140   < > 139 141 142 143  K 5.4*   < > 4.5   < > 3.7   < > 3.6   < > 3.6 4.0 3.7 3.5  CL 104   < > 108   < > 104   < > 104   < > 104 109 104 102  CO2 23   < > 23   < > 22   < > 22   < > 26 21* 27 28  GLUCOSE 162*   < > 154*   < > 230*   < > 296*   < > 177* 155* 124* 161*  BUN 31*   < > 30*   < > 37*   < > 39*   < > 24* 19 19 17   CALCIUM 7.9*   < > 7.6*   < > 8.6*   < > 8.3*   < > 7.7* 8.1* 8.3* 8.2*  CREATININE 2.46*   < > 1.80*   < > 2.03*   < > 2.06*   < > 1.54* 1.40* 1.47* 1.51*  GFRNONAA 26*   < > 39*   < > 33*   < > 33*   < > 47* 52* 49* 48*  GFRAA 31*  --  45*  --  39*  --  38*  --   --   --   --   --    < > = values in this interval not displayed.    LIVER FUNCTION TESTS: Recent Labs    05/19/20 0245 05/20/20 0744 05/21/20 0314 05/22/20 0310  BILITOT 0.6 0.8 1.1 0.7  AST 10* 13* 12* 15  ALT 11 11 12 13   ALKPHOS 39 41 39 45  PROT 6.5 6.8 6.8 7.0    ALBUMIN 2.3* 2.4* 2.5*  2.6*    Assessment and Plan: Patient with history ofrecurrent right perinephric abscess;status post drain placement on 10/3; also with subcapsular/pararenal hematoma; temp 99.3; drain OP minimal; WBC 15.8(13.5), hgb 8.7(8)), creat 1.51(1.47); drain fl cx- e coli; maintain current drain for now; latest urine cx neg; latest blood cx neg to date; left PCN (if needed) would be technically challenging given minimal hydro on imaging; if drain output continues to be minimal will need to discuss possible removal with urology vs f/u imaging first esp if rising WBC trend continues   Electronically Signed: D. Rowe Robert, [name] 05/22/2020, 1:41 PM   I spent a total of 15 minutes at the the patient's bedside AND on the patient's hospital floor or unit, greater than 50% of which was counseling/coordinating care for right perinephric abscess drain    Patient ID: Marco [name], male   DOB: September 01, 1955, 64 y.o.   MRN: [id number]

## 2020-05-22 NOTE — Progress Notes (Signed)
PROGRESS NOTE    Marco [name]  DGU:[id number] DOB: 05-Mar-1956 DOA: 05/12/2020 PCP: [name], MD    Brief Narrative:  64 yo AA male with PMH DMII, HTN, dCHF (EF most recently 50-55% in May, grade 1 DD), RA on biologic therapy.  Patient was admitted in April / May most recently. Had sepsis, hypotension at that time secondary to UTI with a R perinephric abscess (3.9 cm at that time). Drainage of perinephric abscess in late May grew out E.Coli sensitive to cefepime, rocephin.  Assessment & Plan:   Principal Problem:   Severe sepsis ([place]) Active Problems:   Elevated troponin   Atrial fibrillation (HCC)   Essential hypertension   Rheumatoid arthritis (HCC)   Type 2 diabetes mellitus with stage 3 chronic kidney disease, without long-term current use of insulin (HCC)   Chronic combined systolic and diastolic CHF (congestive heart failure) (HCC)   Acute renal failure superimposed on stage 3 chronic kidney disease (HCC)   Acute metabolic encephalopathy   Nephrolithiasis   Perinephric abscess   Pyohydronephrosis  Severe sepsis (HCC) -Presenting fever, tachycardia, tachypnea, leukocytosis, lactic acidosis, encephalopathy, source of infection considered urinary/perinephric abscess - Blood, urine, and perinephric abscess cultures growing E. Coli - abx was later de-escalated to rocephin monotherapy - PCT had been downtrending -Intermittent fevers noted mainly at night, afebrile this AM. WBC had trended up to 15.8 today -recent repeat blood and urine cultures thus far are neg -CXR ordered and reviewed this AM, unremarkable -Continue drain per IR -Will repeat CBC in AM  Perinephric abscess - right sided; seen on CT - s/p drain placement with IR on 10/3. Minimal output documented in Epic - Continue with plan per above.  - IR continuing to follow for drain management  Acute renal failure superimposed on stage 3 chronic kidney disease ([place]) -Unclear etiology, but possible  contribution from outlet obstruction versus severe sepsis vs cardiorenal syndrome related to systolic failure below -Patient may still need left-sided percutaneous nephrostomy tube -IVF held secondary to below acute chf exacerbation - Renal function remains stable at this time, Cr 1.51 - Recheck bmet in AM  Elevated troponin -Suspect secondary to presenting sepsis with demand ischemia. Denies chest pain -Stable currently  Nephrolithiasis -CT reveals a 4 mm left-sided ureteral calculus.  -IVF currently on hold secondary to below acute heart failure -Urology continues to follow. Per Nephrology, if there are concerns for worsening renal function or infection will need L PCN  Acute metabolic encephalopathy -Earlier encephalopathic -Mentation now appears at baseline -Seen by PT. No PT needs were identified  Hyperkalemia-resolved as of 05/14/2020 - continue tele - Potassium currently 3.5 while undergoing diuresis -Will give 68meq PO today - Repeat bmet in AM   Acute on Chronic combined systolic and diastolic CHF (congestive heart failure) ([place]) -Recent CXR ordered and reviewed. Finding suggestive of vascular pulm congestion -IVF held. Pt started on scheduled IV lasix with good results -2d echo reviewed, findings of EF of 25-30% -Chart reviewed. Pt is well known to heart failure service as outpatient and noted to have variable EF heart failure with EF of 25% in 2018, more recently 50% several months ago. Pt is off entresto secondary to hypotension and off hydralazine. Pt is off Jardiance secondary to n/v.  -Work up was initially suggestive of TTR amyloidosis although later genetic testing was neg. Repeat PYP on 1/21 was read as equivocal although Cardiology felt it to be negative -Had discussed with Cardiology during this admit who agrees with current  management -Continued low dose hydralazine and imdur, titrate as BP tolerates -BLE remain edematous. On lasix to 60mg  IV BID -Per outpt  documentation, pt did not tolerate entresto secondary to hypotension  Type 2 diabetes mellitus with stage 3 chronic kidney disease, without long-term current use of insulin (HCC) - continue SSI and CBG monitoring as needed -glucose in the 200's overnight and this AM. Will start lantus 5 units, titrate accordingly  Rheumatoid arthritis ([place]) -Had been on outpatient treatment -Continuing to hold secukinumab given active infection -was started on steroids as patient is on chronic prednisone 5 mg daily at home as well  Essential hypertension -continue 25mg  bid coreg, hydralazine, and imdur per above -BP better controlled -Would continue to titrate regimen as tolerated  Atrial fibrillation (HCC) -Cont on beta blocker per above -Aspirin was on hold in case of need for further interventions, also given recent retroperitoneal bleed  DVT prophylaxis: SCD's Code Status: Full Family Communication: Pt in room  Status is: Inpatient  Remains inpatient appropriate because:Hemodynamically unstable, Ongoing diagnostic testing needed not appropriate for outpatient work up, Unsafe d/c plan and IV treatments appropriate due to intensity of illness or inability to take PO  Dispo: The patient is from: Home              Anticipated d/c is to: Unknown at this time. Will need PT eval when more medically stable              Anticipated d/c date is: > 3 days              Patient currently is not medically stable to d/c.   Consultants:   Urology  IR  Procedures:   Right perinephric abscess drain placement, 05/13/20  Antimicrobials: Anti-infectives (From admission, onward)   Start     Dose/Rate Route Frequency Ordered Stop   05/13/20 1800  cefTRIAXone (ROCEPHIN) 2 g in sodium chloride 0.9 % 100 mL IVPB        2 g 200 mL/hr over 30 Minutes Intravenous Every 24 hours 05/13/20 1748     05/13/20 1630  metroNIDAZOLE (FLAGYL) IVPB 500 mg  Status:  Discontinued        500 mg 100 mL/hr over 60  Minutes Intravenous Every 8 hours 05/13/20 1618 05/17/20 1348   05/13/20 1300  ceFEPIme (MAXIPIME) 2 g in sodium chloride 0.9 % 100 mL IVPB  Status:  Discontinued        2 g 200 mL/hr over 30 Minutes Intravenous Every 12 hours 05/13/20 0353 05/13/20 1748   05/13/20 0400  ceFEPIme (MAXIPIME) 2 g in sodium chloride 0.9 % 100 mL IVPB  Status:  Discontinued        2 g 200 mL/hr over 30 Minutes Intravenous  Once 05/13/20 0347 05/13/20 0352   05/13/20 0015  vancomycin (VANCOREADY) IVPB 2000 mg/400 mL        2,000 mg 200 mL/hr over 120 Minutes Intravenous  Once 05/13/20 0004 05/13/20 0315   05/13/20 0000  ceFEPIme (MAXIPIME) 2 g in sodium chloride 0.9 % 100 mL IVPB        2 g 200 mL/hr over 30 Minutes Intravenous  Once 05/12/20 2356 05/13/20 0110   05/13/20 0000  metroNIDAZOLE (FLAGYL) IVPB 500 mg        500 mg 100 mL/hr over 60 Minutes Intravenous  Once 05/12/20 2356 05/13/20 0202   05/13/20 0000  vancomycin (VANCOCIN) IVPB 1000 mg/200 mL premix  Status:  Discontinued  1,000 mg 200 mL/hr over 60 Minutes Intravenous  Once 05/12/20 2356 05/13/20 0003      Subjective: More tired this AM  Objective: Vitals:   05/22/20 1100 05/22/20 1143 05/22/20 1200 05/22/20 1300  BP: 128/73  (!) 148/74 (!) 147/80  Pulse: 70  71 73  Resp: 18  19 15   Temp: 99.3 F (37.4 C) 99.3 F (37.4 C) 99.5 F (37.5 C) 99.5 F (37.5 C)  TempSrc:  Bladder    SpO2: 93%  96% 96%  Weight:      Height:        Intake/Output Summary (Last 24 hours) at 05/22/2020 1401 Last data filed at 05/22/2020 1310 Gross per 24 hour  Intake 150 ml  Output 3650 ml  Net -3500 ml   Filed Weights   05/19/20 0455 05/20/20 0500 05/22/20 0355  Weight: 108.2 kg 109.6 kg 107.6 kg    Examination: General exam: Conversant, in no acute distress Respiratory system: normal chest rise, clear, no audible wheezing Cardiovascular system: regular rhythm, s1-s2 Gastrointestinal system: Nondistended, nontender, pos BS Central  nervous system: No seizures, no tremors Extremities: No cyanosis, no joint deformities, BLE edema Skin: No rashes, no pallor Psychiatry: Affect normal // no auditory hallucinations   Data Reviewed: I have personally reviewed following labs and imaging studies  CBC: Recent Labs  Lab 05/15/20 1901 05/15/20 2342 05/16/20 0307 05/16/20 0307 05/17/20 0251 05/17/20 0251 05/18/20 0304 05/19/20 0245 05/20/20 0744 05/21/20 0314 05/22/20 0310  WBC 21.1*  --  19.3*   < > 17.6*   < > 17.4* 17.2* 14.3* 13.5* 15.8*  NEUTROABS 17.0*  --  15.1*  --  12.2*  --  12.4*  --   --   --   --   HGB 8.0*   < > 7.3*   < > 7.2*   < > 6.6* 8.7* 9.2* 8.0* 8.7*  HCT 25.8*   < > 23.3*   < > 23.0*   < > 21.3* 27.1* 27.8* 26.1* 27.8*  MCV 89.9  --  90.0   < > 88.8   < > 88.8 89.1 87.4 92.9 91.4  PLT 245  --  244   < > 262   < > 259 291 355 287 396   < > = values in this interval not displayed.   Basic Metabolic Panel: Recent Labs  Lab 05/16/20 0307 05/16/20 0307 05/17/20 0251 05/17/20 0251 05/18/20 0304 05/19/20 0245 05/20/20 0744 05/21/20 0314 05/22/20 0310  NA 140   < > 146*   < > 139 139 141 142 143  K 3.4*   < > 3.7   < > 3.4* 3.6 4.0 3.7 3.5  CL 104   < > 107   < > 102 104 109 104 102  CO2 25   < > 27   < > 25 26 21* 27 28  GLUCOSE 161*   < > 152*   < > 183* 177* 155* 124* 161*  BUN 38*   < > 33*   < > 28* 24* 19 19 17   CREATININE 1.83*   < > 1.73*   < > 1.62* 1.54* 1.40* 1.47* 1.51*  CALCIUM 8.0*   < > 8.4*   < > 7.8* 7.7* 8.1* 8.3* 8.2*  MG 2.1  --  2.2  --  2.0  --   --  1.8 2.1  PHOS 2.9  --  2.5  --  2.4*  --   --   --   --    < > =  values in this interval not displayed.   GFR: Estimated Creatinine Clearance: 56.1 mL/min (A) (by C-G formula based on SCr of 1.51 mg/dL (H)). Liver Function Tests: Recent Labs  Lab 05/19/20 0245 05/20/20 0744 05/21/20 0314 05/22/20 0310  AST 10* 13* 12* 15  ALT 11 11 12 13   ALKPHOS 39 41 39 45  BILITOT 0.6 0.8 1.1 0.7  PROT 6.5 6.8 6.8 7.0    ALBUMIN 2.3* 2.4* 2.5* 2.6*   No results for input(s): LIPASE, AMYLASE in the last 168 hours. No results for input(s): AMMONIA in the last 168 hours. Coagulation Profile: No results for input(s): INR, PROTIME in the last 168 hours. Cardiac Enzymes: No results for input(s): CKTOTAL, CKMB, CKMBINDEX, TROPONINI in the last 168 hours. BNP (last 3 results) No results for input(s): PROBNP in the last 8760 hours. HbA1C: No results for input(s): HGBA1C in the last 72 hours. CBG: Recent Labs  Lab 05/20/20 1556 05/20/20 2007 05/21/20 0732 05/21/20 1626 05/21/20 2127  GLUCAP 171* 140* 123* 242* 232*   Lipid Profile: No results for input(s): CHOL, HDL, LDLCALC, TRIG, CHOLHDL, LDLDIRECT in the last 72 hours. Thyroid Function Tests: No results for input(s): TSH, T4TOTAL, FREET4, T3FREE, THYROIDAB in the last 72 hours. Anemia Panel: No results for input(s): VITAMINB12, FOLATE, FERRITIN, TIBC, IRON, RETICCTPCT in the last 72 hours. Sepsis Labs: Recent Labs  Lab 05/15/20 1901 05/15/20 2342 05/16/20 0307  LATICACIDVEN 2.9* 2.3* 2.1*    Recent Results (from the past 240 hour(s))  Blood Culture (routine x 2)     Status: Abnormal   Collection Time: 05/13/20 12:10 AM   Specimen: BLOOD  Result Value Ref Range Status   Specimen Description   Final    BLOOD RIGHT ANTECUBITAL Performed at [facility] [address] [place], [place] [address]    Special Requests   Final    BOTTLES DRAWN AEROBIC AND ANAEROBIC Blood Culture adequate volume Performed at [facility] [address] [place],  [address]    Culture  Setup Time   Final    GRAM NEGATIVE RODS IN BOTH AEROBIC AND ANAEROBIC BOTTLES Organism ID to follow CRITICAL RESULT CALLED TO, READ BACK BY AND VERIFIED WITH: Shelda Jakes PHARMD, AT 1704 05/13/20 BY Rush [facility]mark Performed at [facility], [place] [address] [place], [place] [address]    Culture ESCHERICHIA COLI (A)  Final    Report Status 05/15/2020 FINAL  Final   Organism ID, Bacteria ESCHERICHIA COLI  Final      Susceptibility   Escherichia coli - MIC*    AMPICILLIN >=32 RESISTANT Resistant     CEFAZOLIN >=64 RESISTANT Resistant     CEFEPIME <=0.12 SENSITIVE Sensitive     CEFTAZIDIME <=1 SENSITIVE Sensitive     CEFTRIAXONE 1 SENSITIVE Sensitive     CIPROFLOXACIN <=0.25 SENSITIVE Sensitive     GENTAMICIN <=1 SENSITIVE Sensitive     IMIPENEM <=0.25 SENSITIVE Sensitive     TRIMETH/SULFA <=20 SENSITIVE Sensitive     AMPICILLIN/SULBACTAM >=32 RESISTANT Resistant     PIP/TAZO 8 SENSITIVE Sensitive     * ESCHERICHIA COLI  Blood Culture ID Panel (Reflexed)     Status: Abnormal   Collection Time: 05/13/20 12:10 AM  Result Value Ref Range Status   Enterococcus faecalis NOT DETECTED NOT DETECTED Final   Enterococcus Faecium NOT DETECTED NOT DETECTED Final   Listeria monocytogenes NOT DETECTED NOT DETECTED Final   Staphylococcus species NOT DETECTED NOT DETECTED Final   Staphylococcus aureus (  BCID) NOT DETECTED NOT DETECTED Final   Staphylococcus epidermidis NOT DETECTED NOT DETECTED Final   Staphylococcus lugdunensis NOT DETECTED NOT DETECTED Final   Streptococcus species NOT DETECTED NOT DETECTED Final   Streptococcus agalactiae NOT DETECTED NOT DETECTED Final   Streptococcus pneumoniae NOT DETECTED NOT DETECTED Final   Streptococcus pyogenes NOT DETECTED NOT DETECTED Final   A.calcoaceticus-baumannii NOT DETECTED NOT DETECTED Final   Bacteroides fragilis NOT DETECTED NOT DETECTED Final   Enterobacterales DETECTED (A) NOT DETECTED Final    Comment: Enterobacterales represent a large order of gram negative bacteria, not a single organism. CRITICAL RESULT CALLED TO, READ BACK BY AND VERIFIED WITH: Shelda Jakes PHARMD, AT 1704 05/13/20 BY D. VANHOOK    Enterobacter cloacae complex NOT DETECTED NOT DETECTED Final   Escherichia coli DETECTED (A) NOT DETECTED Final    Comment: CRITICAL RESULT CALLED TO, READ BACK  BY AND VERIFIED WITH: Shelda Jakes PHARMD, AT 1704 05/13/20 BY D. VANHOOK    Klebsiella aerogenes NOT DETECTED NOT DETECTED Final   Klebsiella oxytoca NOT DETECTED NOT DETECTED Final   Klebsiella pneumoniae NOT DETECTED NOT DETECTED Final   Proteus species NOT DETECTED NOT DETECTED Final   Salmonella species NOT DETECTED NOT DETECTED Final   Serratia marcescens NOT DETECTED NOT DETECTED Final   Haemophilus influenzae NOT DETECTED NOT DETECTED Final   Neisseria meningitidis NOT DETECTED NOT DETECTED Final   Pseudomonas aeruginosa NOT DETECTED NOT DETECTED Final   Stenotrophomonas maltophilia NOT DETECTED NOT DETECTED Final   Candida albicans NOT DETECTED NOT DETECTED Final   Candida auris NOT DETECTED NOT DETECTED Final   Candida glabrata NOT DETECTED NOT DETECTED Final   Candida krusei NOT DETECTED NOT DETECTED Final   Candida parapsilosis NOT DETECTED NOT DETECTED Final   Candida tropicalis NOT DETECTED NOT DETECTED Final   Cryptococcus neoformans/gattii NOT DETECTED NOT DETECTED Final   CTX-M ESBL NOT DETECTED NOT DETECTED Final   Carbapenem resistance IMP NOT DETECTED NOT DETECTED Final   Carbapenem resistance KPC NOT DETECTED NOT DETECTED Final   Carbapenem resistance NDM NOT DETECTED NOT DETECTED Final   Carbapenem resist OXA 48 LIKE NOT DETECTED NOT DETECTED Final   Carbapenem resistance VIM NOT DETECTED NOT DETECTED Final    Comment: Performed at [facility], [address], [place], [place] [address]  Blood Culture (routine x 2)     Status: None   Collection Time: 05/13/20 12:54 AM   Specimen: BLOOD LEFT FOREARM  Result Value Ref Range Status   Specimen Description   Final    BLOOD LEFT FOREARM Performed at [facility] [address] [place], [place] [address]    Special Requests   Final    BOTTLES DRAWN AEROBIC AND ANAEROBIC Blood Culture results may not be optimal due to an excessive volume of blood received in culture bottles Performed at  [facility] [address], [place], [place] [address]    Culture   Final    NO GROWTH 5 DAYS Performed at [facility], [place] [address], [place], [place] [address]    Report Status 05/18/2020 FINAL  Final  Urine culture     Status: Abnormal   Collection Time: 05/13/20 12:54 AM   Specimen: In/Out Cath Urine  Result Value Ref Range Status   Specimen Description   Final    IN/OUT CATH URINE Performed at [facility] [address] [place], [place] [address]    Special Requests   Final  NONE Performed at [facility], [place] [address], [place], [place] [address]    Culture >=100,000 COLONIES/mL ESCHERICHIA COLI (A)  Final   Report Status 05/15/2020 FINAL  Final   Organism ID, Bacteria ESCHERICHIA COLI (A)  Final      Susceptibility   Escherichia coli - MIC*    AMPICILLIN >=32 RESISTANT Resistant     CEFAZOLIN 8 SENSITIVE Sensitive     CEFTRIAXONE 0.5 SENSITIVE Sensitive     CIPROFLOXACIN <=0.25 SENSITIVE Sensitive     GENTAMICIN <=1 SENSITIVE Sensitive     IMIPENEM <=0.25 SENSITIVE Sensitive     NITROFURANTOIN <=16 SENSITIVE Sensitive     TRIMETH/SULFA <=20 SENSITIVE Sensitive     AMPICILLIN/SULBACTAM >=32 RESISTANT Resistant     PIP/TAZO 8 SENSITIVE Sensitive     * >=100,000 COLONIES/mL ESCHERICHIA COLI  Respiratory Panel by RT PCR (Flu A&B, Covid) - Nasopharyngeal Swab     Status: None   Collection Time: 05/13/20  1:26 AM   Specimen: Nasopharyngeal Swab  Result Value Ref Range Status   SARS Coronavirus 2 by RT PCR NEGATIVE NEGATIVE Final    Comment: (NOTE) SARS-CoV-2 target nucleic acids are NOT DETECTED.  The SARS-CoV-2 RNA is generally detectable in upper respiratoy specimens during the acute phase of infection. The lowest concentration of SARS-CoV-2 viral copies this assay can detect is 131 copies/mL. A negative result does not preclude SARS-Cov-2 infection and should not be used as the sole basis for  treatment or other patient management decisions. A negative result may occur with  improper specimen collection/handling, submission of specimen other than nasopharyngeal swab, presence of viral mutation(s) within the areas targeted by this assay, and inadequate number of viral copies (<131 copies/mL). A negative result must be combined with clinical observations, patient history, and epidemiological information. The expected result is Negative.  Fact Sheet for Patients:  PinkCheek.be  Fact Sheet for Healthcare Providers:  GravelBags.it  This test is no t yet approved or cleared by the [place] FDA and  has been authorized for detection and/or diagnosis of SARS-CoV-2 by FDA under an Emergency Use Authorization (EUA). This EUA will remain  in effect (meaning this test can be used) for the duration of the COVID-19 declaration under Section 564(b)(1) of the Act, 21 U.S.C. section 360bbb-3(b)(1), unless the authorization is terminated or revoked sooner.     Influenza A by PCR NEGATIVE NEGATIVE Final   Influenza B by PCR NEGATIVE NEGATIVE Final    Comment: (NOTE) The Xpert Xpress SARS-CoV-2/FLU/RSV assay is intended as an aid in  the diagnosis of influenza from Nasopharyngeal swab specimens and  should not be used as a sole basis for treatment. Nasal washings and  aspirates are unacceptable for Xpert Xpress SARS-CoV-2/FLU/RSV  testing.  Fact Sheet for Patients: PinkCheek.be  Fact Sheet for Healthcare Providers: GravelBags.it  This test is not yet approved or cleared by the [place] FDA and  has been authorized for detection and/or diagnosis of SARS-CoV-2 by  FDA under an Emergency Use Authorization (EUA). This EUA will remain  in effect (meaning this test can be used) for the duration of the  Covid-19 declaration under Section 564(b)(1) of the Act, 21   U.S.C. section 360bbb-3(b)(1), unless the authorization is  terminated or revoked. Performed at [facility], [place] [address] [place], [place] [address]   Aerobic/Anaerobic Culture (surgical/deep wound)     Status: None   Collection Time: 05/13/20  6:20 AM   Specimen: Abdomen; Abscess  Result Value  Ref Range Status   Specimen Description   Final    ABDOMEN Performed at [facility] [address], [place], [place] [address]    Special Requests   Final    NONE Performed at [facility], [place] [address], [place], [place] [address]    Gram Stain   Final    ABUNDANT WBC PRESENT, PREDOMINANTLY PMN MODERATE GRAM NEGATIVE RODS    Culture   Final    ABUNDANT ESCHERICHIA COLI NO ANAEROBES ISOLATED Performed at [facility], [place] [address], [place], [place] [address]    Report Status 05/18/2020 FINAL  Final   Organism ID, Bacteria ESCHERICHIA COLI  Final      Susceptibility   Escherichia coli - MIC*    AMPICILLIN >=32 RESISTANT Resistant     CEFAZOLIN 8 SENSITIVE Sensitive     CEFEPIME <=0.12 SENSITIVE Sensitive     CEFTAZIDIME <=1 SENSITIVE Sensitive     CEFTRIAXONE <=0.25 SENSITIVE Sensitive     CIPROFLOXACIN <=0.25 SENSITIVE Sensitive     GENTAMICIN <=1 SENSITIVE Sensitive     IMIPENEM <=0.25 SENSITIVE Sensitive     TRIMETH/SULFA <=20 SENSITIVE Sensitive     AMPICILLIN/SULBACTAM >=32 RESISTANT Resistant     PIP/TAZO 8 SENSITIVE Sensitive     * ABUNDANT ESCHERICHIA COLI  Culture, blood (routine x 2)     Status: None   Collection Time: 05/14/20  5:00 AM   Specimen: BLOOD  Result Value Ref Range Status   Specimen Description   Final    BLOOD RIGHT ANTECUBITAL Performed at [facility] [address] [name], [place] [address]    Special Requests   Final    BOTTLES DRAWN AEROBIC AND ANAEROBIC Blood Culture adequate volume Performed at [facility] [address],  [place], [place] [address]    Culture   Final    NO GROWTH 5 DAYS Performed at [facility], [place] [address], [place], [place] [address]    Report Status 05/19/2020 FINAL  Final  Culture, blood (routine x 2)     Status: None   Collection Time: 05/14/20  5:05 AM   Specimen: BLOOD  Result Value Ref Range Status   Specimen Description   Final    BLOOD RIGHT ANTECUBITAL Performed at [facility] [address], [place], [place] [address]    Special Requests   Final    BOTTLES DRAWN AEROBIC AND ANAEROBIC Blood Culture adequate volume Performed at [facility] [address] [place], [place] [address]    Culture   Final    NO GROWTH 5 DAYS Performed at [facility], [place] [address], [place], [place] [address]    Report Status 05/19/2020 FINAL  Final  MRSA PCR Screening     Status: Abnormal   Collection Time: 05/14/20  6:32 PM   Specimen: Nasopharyngeal  Result Value Ref Range Status   MRSA by PCR POSITIVE (A) NEGATIVE Final    Comment:        The GeneXpert MRSA Assay (FDA approved for NASAL specimens only), is one component of a comprehensive MRSA colonization surveillance program. It is not intended to diagnose MRSA infection nor to guide or monitor treatment for MRSA infections. RESULT CALLED TO, READ BACK BY AND VERIFIED WITH: Gavin Potters, K. RN @1028  ON 10.5.2021 BY [facility] Performed at [facility], [place] [address] [place],  [address]   Culture, blood (routine x 2)  Status: None (Preliminary result)   Collection Time: 05/21/20  9:42 AM   Specimen: BLOOD  Result Value Ref Range Status   Specimen Description   Final    BLOOD RIGHT ARM Performed at [facility] [address], [place], [place] [address]    Special Requests   Final    BOTTLES DRAWN AEROBIC ONLY Blood Culture results may not be optimal due to an inadequate volume of blood received in culture  bottles Performed at [facility] [address], [place], [place] [address]    Culture   Final    NO GROWTH < 24 HOURS Performed at [facility] [address] [place], [place] [address]    Report Status PENDING  Incomplete  Culture, Urine     Status: None   Collection Time: 05/21/20  9:45 AM   Specimen: Urine, Clean Catch  Result Value Ref Range Status   Specimen Description   Final    URINE, CLEAN CATCH Performed at [facility], [place] [address], [place], [place] [address]    Special Requests   Final    NONE Performed at [facility], [place] [address], [place], [place] [address]    Culture   Final    NO GROWTH Performed at [facility], [place] [address] [place], [place] [address]    Report Status 05/22/2020 FINAL  Final  Culture, blood (routine x 2)     Status: None (Preliminary result)   Collection Time: 05/21/20  9:58 AM   Specimen: BLOOD LEFT HAND  Result Value Ref Range Status   Specimen Description   Final    BLOOD LEFT HAND Performed at [facility] [address], [place], [place] [address]    Special Requests   Final    BOTTLES DRAWN AEROBIC ONLY Blood Culture adequate volume Performed at [facility] [address] [place], [place] [address]    Culture   Final    NO GROWTH < 24 HOURS Performed at [facility] [address] [address], [place], [place] [address]    Report Status PENDING  Incomplete     Radiology Studies: DG CHEST PORT 1 VIEW  Result Date: 05/22/2020 CLINICAL DATA:  Fever EXAM: PORTABLE CHEST 1 VIEW COMPARISON:  May 17, 2020 FINDINGS: There is atelectatic change in the region of the right minor fissure. Lungs elsewhere are clear. Heart is borderline enlarged with pulmonary vascularity normal. No adenopathy. No bone lesions. Occasional foci of carotid artery calcification noted bilaterally. IMPRESSION: Atelectatic change right minor  fissure region. Lungs otherwise clear. Stable cardiac silhouette. Calcification noted in each carotid artery. Electronically Signed   By: Lowella Grip III M.D.   On: 05/22/2020 09:13    Scheduled Meds: . carvedilol  25 mg Oral BID WC  . Chlorhexidine Gluconate Cloth  6 each Topical Daily  . furosemide  60 mg Intravenous Q12H  . hydrALAZINE  10 mg Oral Q8H  . insulin aspart  0-9 Units Subcutaneous TID AC & HS  . isosorbide mononitrate  15 mg Oral Daily  . predniSONE  5 mg Oral Q breakfast  . sodium chloride flush  5 mL Intracatheter Q8H   Continuous Infusions: . cefTRIAXone (ROCEPHIN)  IV Stopped (05/21/20 1836)     LOS: 9 days   Marylu Lund, MD [facility] Pager On Amion  If 7PM-7AM, please contact night-coverage 05/22/2020, 2:01 PM

## 2020-05-22 NOTE — Progress Notes (Signed)
Initial Nutrition Assessment  DOCUMENTATION CODES:   Obesity unspecified  INTERVENTION:  - will liberalize diet from Heart Healthy/Carb Modified to Regular. - will order 1 tablet multivitamin with minerals/day. - encourage wife to bring in foods from home/outside of the hospital.  NUTRITION DIAGNOSIS:   Inadequate oral intake related to poor appetite as evidenced by per patient/family report.  GOAL:   Patient will meet greater than or equal to 90% of their needs  MONITOR:   PO intake, Labs, Weight trends, I & O's  REASON FOR ASSESSMENT:   Consult Poor PO  ASSESSMENT:   64 year-old male with medical history of type 2 DM, HTN, CHF, and RA. He presented to the ED with AMS and ongoing back and flank pain since hospitalization in May.  Recent intakes: 0% of lunch and dinner on 10/8; 0% of breakfast, 10% of lunch, 0% of dinner on 10/9; 0% of breakfast and lunch and 5% of dinner on 10/10.   Patient laying in bed with wife at bedside. He confirms that he has not been feeling hungry or interested in eating. He wanted scrambled eggs and baked apples this afternoon so wife brought these items in for him and he was able to consume a good portion of them without issue.  He has had no nausea but has been experiencing intermittent cramps which he feels is 2/2 inconsistent timing of BMs which is a new thing since admission. He states this cramping is not worsened by PO intakes and is not relieved by anything other than having a BM.   He has been able to drink fluids throughout the day without issue and feels he has been drinking adequate fluids.   Weight today is 237 lb and weight on admission (10/3) was 234 lb.   Patient refused all oral nutrition supplements after trying many of them during last admission. He is refusing any supplements to be ordered at this time.    Labs reviewed; creatinine: 1.51 mg/dl, Ca: 8.2 mg/dl, GFR: 48 ml/min.  Medications reviewed; 60 mg IV lasix BID,  sliding scale novolog, 5 units lantus/day, 40 mEq Klor-Con x1 dose 10/12, 5 mg deltasone/day.     NUTRITION - FOCUSED PHYSICAL EXAM:  completed; no muscle or fat wasting.   Diet Order:   Diet Order            Diet regular Room service appropriate? Yes; Fluid consistency: Thin  Diet effective now                 EDUCATION NEEDS:   No education needs have been identified at this time  Skin:  Skin Assessment: Reviewed RN Assessment  Last BM:  10/9  Height:   Ht Readings from Last 1 Encounters:  05/16/20 5\' 6"  (1.676 m)    Weight:   Wt Readings from Last 1 Encounters:  05/22/20 107.6 kg    Estimated Nutritional Needs:  Kcal:  1715-1925 kcal Protein:  85-95 grams Fluid:  >/= 2 L/day     Jarome Matin, MS, RD, LDN, CNSC Inpatient Clinical Dietitian RD pager # available in AMION  After hours/weekend pager # available in [facility]

## 2020-05-22 NOTE — Progress Notes (Signed)
Physical Therapy Treatment Patient Details Name: Marco [name] MRN: [id number] DOB: 29-Jan-1956 Today's Date: 05/22/2020    History of Present Illness 64 yo AA male with PMH significant for DMII, HTN, dCHF (EF most recently 50-55% in May, grade 1 DD), RA on biologic therapy and admitted for severe sepsis and Perinephric abscess s/p drain by IR on 10/3    PT Comments    Pt in bed with spouse at bedside.  RN reported pt was OOB in recliner earlier.  Assisted OOB to amb.  General bed mobility comments: assist for trunk upright, assist for managing multiple lines/leads.  General transfer comment: assist to rise and control desend with VC's on proper hand placement.  General Gait Details: used EVA walker for increased support to promote an increased distance and upright time.  Pt slowly amb 44 feet with 3 standing rest breaks due to weakness/mild dyspnea.  RA avg 94% and HR increased from 78 at rest to 83 with mobility.  At end of walk and sitting EOB pt c/o increased dizziness.  BP decreased from 152/58 at rest supine to 118/62 seated EOB. Assisted back to bed and positioned to comfort.    Follow Up Recommendations  No PT follow up     Equipment Recommendations  None recommended by PT    Recommendations for Other Services       Precautions / Restrictions Precautions Precaution Comments: R posterior drain    Mobility  Bed Mobility Overal bed mobility: Needs Assistance Bed Mobility: Supine to Sit;Sit to Supine     Supine to sit: Min assist Sit to supine: Min assist;Mod assist   General bed mobility comments: assist for trunk upright, assist for managing multiple lines/leads  Transfers Overall transfer level: Needs assistance Equipment used: Bilateral platform walker (EVA walker) Transfers: Sit to/from Stand Sit to Stand: Min assist;+2 safety/equipment         General transfer comment: assist to rise and control desend with VC's on proper hand  placement  Ambulation/Gait Ambulation/Gait assistance: Min assist;+2 safety/equipment Gait Distance (Feet): 44 Feet Assistive device: Bilateral platform walker (EVA walker) Gait Pattern/deviations: Step-through pattern;Decreased stride length Gait velocity: decreased   General Gait Details: used EVA walker for increased support to promote an increased distance and upright time.  Pt slowly amb 44 feet with 3 standing rest breaks due to weakness/mild dyspnea.  RA avg 94% and HR increased from 78 at rest to 83 with mobility.  At end of walk and sitting EOB pt c/o increased dizziness.  BP decreased from 152/58 at rest supine to 118/62 seated EOB.   Stairs             Wheelchair Mobility    Modified Rankin (Stroke Patients Only)       Balance                                            Cognition Arousal/Alertness: Awake/alert Behavior During Therapy: Flat affect Overall Cognitive Status: Within Functional Limits for tasks assessed                                 General Comments: AxO x 3 willing but in pain      Exercises      General Comments        Pertinent Vitals/Pain Pain Assessment: 0-10  Pain Score: 9  Pain Location: right drain area Pain Descriptors / Indicators: Sharp Pain Intervention(s): Monitored during session;Repositioned    Home Living                      Prior Function            PT Goals (current goals can now be found in the care plan section) Progress towards PT goals: Progressing toward goals    Frequency    Min 3X/week      PT Plan Current plan remains appropriate    Co-evaluation              AM-PAC PT "6 Clicks" Mobility   Outcome Measure  Help needed turning from your back to your side while in a flat bed without using bedrails?: A Little Help needed moving from lying on your back to sitting on the side of a flat bed without using bedrails?: A Little Help needed moving to  and from a bed to a chair (including a wheelchair)?: A Little Help needed standing up from a chair using your arms (e.g., wheelchair or bedside chair)?: A Little Help needed to walk in hospital room?: A Little Help needed climbing 3-5 steps with a railing? : A Lot 6 Click Score: 17    End of Session Equipment Utilized During Treatment: Gait belt Activity Tolerance: Patient tolerated treatment well Patient left: in bed;with call bell/phone within reach;with family/visitor present Nurse Communication: Mobility status;Patient requests pain meds PT Visit Diagnosis: Difficulty in walking, not elsewhere classified (R26.2)     Time: 2023-3435 PT Time Calculation (min) (ACUTE ONLY): 28 min  Charges:  $Gait Training: 8-22 mins $Therapeutic Activity: 8-22 mins                     Rica Koyanagi  PTA Acute  Rehabilitation Services Pager      [phone] Office      [phone]

## 2020-05-23 ENCOUNTER — Inpatient Hospital Stay: Admission: RE | Admit: 2020-05-23 | Payer: Medicare Other | Source: Ambulatory Visit

## 2020-05-23 LAB — CBC
HCT: 28.5 % — ABNORMAL LOW (ref 39.0–52.0)
Hemoglobin: 9 g/dL — ABNORMAL LOW (ref 13.0–17.0)
MCH: 29 pg (ref 26.0–34.0)
MCHC: 31.6 g/dL (ref 30.0–36.0)
MCV: 91.9 fL (ref 80.0–100.0)
Platelets: 431 10*3/uL — ABNORMAL HIGH (ref 150–400)
RBC: 3.1 MIL/uL — ABNORMAL LOW (ref 4.22–5.81)
RDW: 20.5 % — ABNORMAL HIGH (ref 11.5–15.5)
WBC: 15.3 10*3/uL — ABNORMAL HIGH (ref 4.0–10.5)
nRBC: 0 % (ref 0.0–0.2)

## 2020-05-23 LAB — GLUCOSE, CAPILLARY
Glucose-Capillary: 177 mg/dL — ABNORMAL HIGH (ref 70–99)
Glucose-Capillary: 180 mg/dL — ABNORMAL HIGH (ref 70–99)
Glucose-Capillary: 188 mg/dL — ABNORMAL HIGH (ref 70–99)
Glucose-Capillary: 257 mg/dL — ABNORMAL HIGH (ref 70–99)
Glucose-Capillary: 159 mg/dL — ABNORMAL HIGH (ref 70–99)
Glucose-Capillary: 240 mg/dL — ABNORMAL HIGH (ref 70–99)
Glucose-Capillary: 198 mg/dL — ABNORMAL HIGH (ref 70–99)
Glucose-Capillary: 160 mg/dL — ABNORMAL HIGH (ref 70–99)

## 2020-05-23 LAB — COMPREHENSIVE METABOLIC PANEL
ALT: 12 U/L (ref 0–44)
AST: 14 U/L — ABNORMAL LOW (ref 15–41)
Albumin: 2.8 g/dL — ABNORMAL LOW (ref 3.5–5.0)
Alkaline Phosphatase: 50 U/L (ref 38–126)
Anion gap: 8 (ref 5–15)
BUN: 16 mg/dL (ref 8–23)
CO2: 29 mmol/L (ref 22–32)
Calcium: 8.1 mg/dL — ABNORMAL LOW (ref 8.9–10.3)
Chloride: 101 mmol/L (ref 98–111)
Creatinine, Ser: 1.51 mg/dL — ABNORMAL HIGH (ref 0.61–1.24)
GFR, Estimated: 48 mL/min — ABNORMAL LOW (ref >60)
Glucose, Bld: 166 mg/dL — ABNORMAL HIGH (ref 70–99)
Potassium: 3.5 mmol/L (ref 3.5–5.1)
Sodium: 138 mmol/L (ref 135–145)
Total Bilirubin: 0.9 mg/dL (ref 0.3–1.2)
Total Protein: 7.1 g/dL (ref 6.5–8.1)

## 2020-05-23 NOTE — Progress Notes (Signed)
Report called to Durenda Hurt RN. All questions answered at this time by RN. All patient belongings and paper chart transported with pt. Pt taken upstairs in the bed on Tele by RN and NT. Check in completed, 4W will resume care for pt.

## 2020-05-23 NOTE — Progress Notes (Signed)
Pt has refused CPAP qhs for the night.  Pt encouraged to contact RT should he change his mind.

## 2020-05-23 NOTE — Progress Notes (Signed)
Referring Physician(s): Dr. Claudia Desanctis  Supervising Physician: Markus Daft  Patient Status:  [facility] - In-pt  Chief Complaint: AMS and Sepsis secondary to persistent perinephric abscess. S/p right perinephric abscess drain 05/13/20 by Dr. Earleen Newport.   Subjective: Patient awake/alert in bed. He is eager to go home. He denies any pain or discomfort. He is pleasant and conversational.  Allergies: Nsaids and Jardiance [empagliflozin]  Medications: Prior to Admission medications   Medication Sig Start Date End Date Taking? Authorizing Provider  albuterol (PROVENTIL HFA;VENTOLIN HFA) 108 (90 Base) MCG/ACT inhaler Inhale 2 puffs into the lungs every 6 (six) hours as needed for wheezing or shortness of breath. 04/29/18  Yes [name], MD  allopurinol (ZYLOPRIM) 100 MG tablet Take 200 mg by mouth every evening.    Yes [provider]  aspirin (EQ ASPIRIN ADULT LOW DOSE) 81 MG EC tablet TAKE 1 TABLET BY MOUTH ONCE DAILY. SWALLOW WHOLE Patient taking differently: Take 81 mg by mouth daily. TAKE 1 TABLET BY MOUTH ONCE DAILY. SWALLOW WHOLE 02/29/20  Yes [name], MD  atorvastatin (LIPITOR) 40 MG tablet Take 1 tablet (40 mg total) by mouth daily. 09/27/19  Yes Sueanne Margarita, MD  carvedilol (COREG) 12.5 MG tablet Take 1 tablet (12.5 mg total) by mouth 2 (two) times daily with a meal. 09/12/19  Yes Larey Dresser, MD  cyclobenzaprine (FLEXERIL) 5 MG tablet Take 5 mg by mouth every 12 (twelve) hours. 04/05/20  Yes [provider]  diclofenac Sodium (VOLTAREN) 1 % GEL Apply 4 g topically 4 (four) times daily. Patient taking differently: Apply 4 g topically 4 (four) times daily as needed (pain).  10/23/19  Yes Darr, Marguerita Beards, [name]  DULoxetine (CYMBALTA) 20 MG capsule TAKE 1 CAPSULE BY MOUTH ONCE DAILY Patient taking differently: Take 20 mg by mouth daily.  03/27/20  Yes [name], MD  famotidine (PEPCID) 20 MG tablet TAKE 1 TABLET BY MOUTH TWO TIMES DAILY Patient taking  differently: Take 20 mg by mouth 2 (two) times daily.  04/18/20  Yes [name], MD  ferrous sulfate 325 (65 FE) MG tablet Take 1 tablet (325 mg total) by mouth 2 (two) times daily with a meal. 01/14/20  Yes Lavina Hamman, MD  furosemide (LASIX) 80 MG tablet Take 80 mg by mouth daily.    Yes [provider]  Insulin Lispro Prot & Lispro (HUMALOG MIX 75/25 KWIKPEN) (75-25) 100 UNIT/ML Kwikpen Inject 100 Units into the skin daily with breakfast. 12/20/19  Yes Renato Shin, MD  isosorbide mononitrate (IMDUR) 30 MG 24 hr tablet Take 30 mg by mouth daily. 01/19/20  Yes [provider]  naloxone [facility]) nasal spray 4 mg/0.1 mL Place in nostril in the event of suspected overdose on Narcotic medication (Oxycodone) 02/16/20  Yes [name], MD  oxyCODONE (OXY IR/ROXICODONE) 5 MG immediate release tablet Take 5 mg by mouth 3 (three) times daily as needed for moderate pain.  01/24/20  Yes [provider]  polyethylene glycol (MIRALAX / GLYCOLAX) 17 g packet Take 17 g by mouth daily as needed for moderate constipation or severe constipation. 01/14/20  Yes Lavina Hamman, MD  Potassium Chloride ER 20 MEQ TBCR Take 20 mEq by mouth daily.  01/15/20  Yes [provider]  predniSONE (DELTASONE) 5 MG tablet Take 5 mg by mouth daily. 11/23/19  Yes [provider]  pregabalin (LYRICA) 100 MG capsule Take 100 mg by mouth 3 (three) times daily.  Yes [provider]  ramelteon (ROZEREM) 8 MG tablet TAKE 1 TABLET BY MOUTH AT BEDTIME Patient taking differently: Take 8 mg by mouth at bedtime.  04/18/20  Yes [name], MD  Secukinumab (COSENTYX) 150 MG/ML SOSY Inject 150 mg into the skin every 28 (twenty-eight) days. Hold until told to resume by PCP 01/14/20  Yes Lavina Hamman, MD  Tafamidis [facility]) 61 MG CAPS Take 61 mg by mouth daily. 09/08/19  Yes Bensimhon, Shaune Pascal, MD  Accu-Chek FastClix Lancets MISC 1 each by Other route 2 (two) times daily. E11.42  03/27/20   Renato Shin, MD  ACCU-CHEK GUIDE test strip USE 1 STRIP TWICE DAILY TO CHECK BLOOD GLUCOSE 02/13/20   Renato Shin, MD  Insulin Pen Needle 32G X 4 MM MISC 1 each by Does not apply route daily. E11.9 03/06/20   Renato Shin, MD  sacubitril-valsartan (ENTRESTO) 24-26 MG Take 1 tablet by mouth 2 (two) times daily. 05/11/20   Bensimhon, Shaune Pascal, MD     Vital Signs: BP 132/77 (BP Location: Right Arm)    Pulse 68    Temp 98.6 F (37 C) (Oral)    Resp 15    Ht 5\' 6"  (1.676 m)    Wt 236 lb 1.8 oz (107.1 kg)    SpO2 92%    BMI 38.11 kg/m   Physical Exam Constitutional:      General: He is not in acute distress. Pulmonary:     Effort: Pulmonary effort is normal.  Abdominal:     Palpations: Abdomen is soft.     Tenderness: There is no abdominal tenderness.     Comments: Right perinephric drain. Site is clean and dry. Sutures and stat lock in place. Scant amount of dark red fluid in JP bulb.   Skin:    General: Skin is warm and dry.  Neurological:     Mental Status: He is alert and oriented to person, place, and time.  Psychiatric:        Mood and Affect: Mood normal.        Behavior: Behavior normal.        Thought Content: Thought content normal.        Judgment: Judgment normal.     Imaging: DG CHEST PORT 1 VIEW  Result Date: 05/22/2020 CLINICAL DATA:  Fever EXAM: PORTABLE CHEST 1 VIEW COMPARISON:  May 17, 2020 FINDINGS: There is atelectatic change in the region of the right minor fissure. Lungs elsewhere are clear. Heart is borderline enlarged with pulmonary vascularity normal. No adenopathy. No bone lesions. Occasional foci of carotid artery calcification noted bilaterally. IMPRESSION: Atelectatic change right minor fissure region. Lungs otherwise clear. Stable cardiac silhouette. Calcification noted in each carotid artery. Electronically Signed   By: Lowella Grip III M.D.   On: 05/22/2020 09:13    Labs:  CBC: Recent Labs    05/20/20 0744 05/21/20 0314  05/22/20 0310 05/23/20 0240  WBC 14.3* 13.5* 15.8* 15.3*  HGB 9.2* 8.0* 8.7* 9.0*  HCT 27.8* 26.1* 27.8* 28.5*  PLT 355 287 396 431*    COAGS: Recent Labs    12/08/19 2014 12/30/19 1106 05/13/20 0054 05/13/20 0700  INR 1.3* 1.2 1.3* 1.4*  APTT 33  --  31  --     BMP: Recent Labs    05/13/20 0700 05/13/20 0700 05/14/20 0509 05/14/20 0509 05/15/20 0055 05/15/20 0055 05/15/20 0344 05/15/20 1316 05/20/20 0744 05/21/20 0314 05/22/20 0310 05/23/20 0240  NA 139   < > 139   < >  142   < > 140   < > 141 142 143 138  K 5.4*   < > 4.5   < > 3.7   < > 3.6   < > 4.0 3.7 3.5 3.5  CL 104   < > 108   < > 104   < > 104   < > 109 104 102 101  CO2 23   < > 23   < > 22   < > 22   < > 21* 27 28 29   GLUCOSE 162*   < > 154*   < > 230*   < > 296*   < > 155* 124* 161* 166*  BUN 31*   < > 30*   < > 37*   < > 39*   < > 19 19 17 16   CALCIUM 7.9*   < > 7.6*   < > 8.6*   < > 8.3*   < > 8.1* 8.3* 8.2* 8.1*  CREATININE 2.46*   < > 1.80*   < > 2.03*   < > 2.06*   < > 1.40* 1.47* 1.51* 1.51*  GFRNONAA 26*   < > 39*   < > 33*   < > 33*   < > 52* 49* 48* 48*  GFRAA 31*  --  45*  --  39*  --  38*  --   --   --   --   --    < > = values in this interval not displayed.    LIVER FUNCTION TESTS: Recent Labs    05/20/20 0744 05/21/20 0314 05/22/20 0310 05/23/20 0240  BILITOT 0.8 1.1 0.7 0.9  AST 13* 12* 15 14*  ALT 11 12 13 12   ALKPHOS 41 39 45 50  PROT 6.8 6.8 7.0 7.1  ALBUMIN 2.4* 2.5* 2.6* 2.8*    Assessment and Plan:  Persistent perinephric abscess; s/p perinephric abscess drain 05/13/20:Hgb 9. Previous CT from 05/15/20 with right retroperitoneal hematoma, no active bleeding on recent CTA. CT abdomen/pelvis 05/18/20 showed stable/unchanged hematoma. There has been minimal drain output since then and the patient is ready for discharge. After discussions between Dr. Claudia Desanctis, Dr. Anselm Pancoast and Dr. Erlinda Hong the decision was made to pull the drain today, keep the patient for overnight observation and re-check a  CBC in the morning. If his hemoglobin level is stable he will be cleared for discharge from IR standpoint.   The drain was removed without difficulty and a gauze dressing was applied over the site.   IR will continue to follow. Other plans per primary teams.  Electronically Signed: Soyla Dryer, AGACNP-BC [phone] 05/23/2020, 2:30 PM   I spent a total of 25 Minutes at the the patient's bedside AND on the patient's hospital floor or unit, greater than 50% of which was counseling/coordinating care for right perinephric drain.

## 2020-05-23 NOTE — Progress Notes (Signed)
PROGRESS NOTE    Marco [name]  GLO:[id number] DOB: 02-16-1956 DOA: 05/12/2020 PCP: [name], MD    Chief Complaint  Patient presents with  . Fever  . Weakness    Brief Narrative:  64 yo AA male with PMH DMII, HTN, dCHF (EF most recently 50-55% in May, grade 1 DD), RA on biologic therapy.  Patient was admitted in April / May most recently. Had sepsis, hypotension at that time secondary to UTI with a R perinephric abscess (3.9 cm at that time). Drainage of perinephric abscess in late May grew out E.Coli sensitive to cefepime, rocephin  Subjective:  He reports confusion at night, no confusion during the day He denies pain, he wants to go home, reports he will do better during the night  at home  Assessment & Plan:   Principal Problem:   Severe sepsis ([place]) Active Problems:   Elevated troponin   Atrial fibrillation (HCC)   Essential hypertension   Rheumatoid arthritis ([place])   Type 2 diabetes mellitus with stage 3 chronic kidney disease, without long-term current use of insulin (HCC)   Chronic combined systolic and diastolic CHF (congestive heart failure) (HCC)   Acute renal failure superimposed on stage 3 chronic kidney disease ([place])   Acute metabolic encephalopathy   Nephrolithiasis   Perinephric abscess   Pyohydronephrosis  Sever sepsis in an immunosuppressed patient  -Sepsis presents on admission with fever tachycardia, tachypnea, and leukocytosis, lactic acidosis, acute metabolic encephalopathy, source of infection urinary/perinephric abscess/bacteremia -Initial urine culture/blood culture and urine culture all positive for E. Coli -He is treated with IV Rocephin, status post drain placement with IR on October 3, IR to discontinue drain today, repeat blood culture no growth  Retroperitoneal hematoma -After drain placement -Hemoglobin stable -Aspirin on hold  Nephrolithiasis -CT reveals 4 mm left-sided ureteral stone -Urology continue following,  questionable new left sided PCN if renal function does not improve -We will follow urology recommendation  Urethral stricture Currently has Foley catheter in, voiding trial per urology  AKI on CKD 3 a -Creatinine peaked at 2.06 -Appear back to baseline close to 1.4-1.5 range -Renal dosing meds  Acute on chronic combined CHF -2d echo reviewed, findings of EF of 25-30% -Chart reviewed. Pt is well known to heart failure service as outpatient and noted to have variable EF heart failure with EF of 25% in 2018, more recently 50% several months ago. Pt is off entresto secondary to hypotension and off hydralazine. Pt is off Jardiance secondary to n/v.  -tafamidis also listed on home med list,  -Currently on Coreg 25 mg twice a day, IV Lasix 60 mg twice daily, hydralazine 10 mg every 8 hours, Imdur 15 mg daily -He is on room air at rest ,trace edema on exam today -Close monitor volume status  Paroxysmal A. Fib -Currently sinus rhythm -He is not on anticoagulation at home -Aspirin held due to retroperitoneal hematoma  Insulin-dependent type 2 diabetes -Reported on Humalog at home -Currently on Lantus daily with SSI -We will check A1c  Rheumatoid arthritis/chronic immunosuppressed status -Continuing to hold secukinumab given active infection -was started on steroids as patient is on chronic prednisone 5 mg daily at home as well   DVT prophylaxis: Place and maintain sequential compression device Start: 05/16/20 0942   Code Status: Full Family Communication: Will call wife Disposition:   Status is: Inpatient    Dispo: The patient is from: Home              Anticipated  d/c is to: Home              Anticipated d/c date is: Likely tomorrow if hemoglobin stable ,need urology clearance                Consultants:   Urology  Interventional radiology  Procedures:   Right PCN placement  Antimicrobials:   As above     Objective: Vitals:   05/23/20 0751 05/23/20 0800  05/23/20 0900 05/23/20 1106  BP: (!) 173/71 (!) 166/73 (!) 150/68 132/77  Pulse: 78 74 68 68  Resp:  18 17 15   Temp:  99.5 F (37.5 C) 99.7 F (37.6 C) 98.6 F (37 C)  TempSrc:  Bladder  Oral  SpO2:  92% 91% 92%  Weight:      Height:        Intake/Output Summary (Last 24 hours) at 05/23/2020 1301 Last data filed at 05/23/2020 1200 Gross per 24 hour  Intake 760 ml  Output 930 ml  Net -170 ml   Filed Weights   05/20/20 0500 05/22/20 0355 05/23/20 0500  Weight: 109.6 kg 107.6 kg 107.1 kg    Examination:  General exam: calm, NAD Respiratory system: Clear to auscultation. Respiratory effort normal. Cardiovascular system: S1 & S2 heard, RRR.  Trace bilateral pedal edema. Gastrointestinal system: Abdomen is nondistended, soft and nontender.  Normal bowel sounds heard. Central nervous system: Alert and oriented. No focal neurological deficits. Extremities: Symmetric 5 x 5 power. Skin: No rashes, lesions or ulcers Psychiatry: Judgement and insight appear normal. Mood & affect appropriate.     Data Reviewed: I have personally reviewed following labs and imaging studies  CBC: Recent Labs  Lab 05/17/20 0251 05/17/20 0251 05/18/20 0304 05/18/20 0304 05/19/20 0245 05/20/20 0744 05/21/20 0314 05/22/20 0310 05/23/20 0240  WBC 17.6*   < > 17.4*   < > 17.2* 14.3* 13.5* 15.8* 15.3*  NEUTROABS 12.2*  --  12.4*  --   --   --   --   --   --   HGB 7.2*   < > 6.6*   < > 8.7* 9.2* 8.0* 8.7* 9.0*  HCT 23.0*   < > 21.3*   < > 27.1* 27.8* 26.1* 27.8* 28.5*  MCV 88.8   < > 88.8   < > 89.1 87.4 92.9 91.4 91.9  PLT 262   < > 259   < > 291 355 287 396 431*   < > = values in this interval not displayed.    Basic Metabolic Panel: Recent Labs  Lab 05/17/20 0251 05/17/20 0251 05/18/20 0304 05/18/20 0304 05/19/20 0245 05/20/20 0744 05/21/20 0314 05/22/20 0310 05/23/20 0240  NA 146*   < > 139   < > 139 141 142 143 138  K 3.7   < > 3.4*   < > 3.6 4.0 3.7 3.5 3.5  CL 107   < > 102    < > 104 109 104 102 101  CO2 27   < > 25   < > 26 21* 27 28 29   GLUCOSE 152*   < > 183*   < > 177* 155* 124* 161* 166*  BUN 33*   < > 28*   < > 24* 19 19 17 16   CREATININE 1.73*   < > 1.62*   < > 1.54* 1.40* 1.47* 1.51* 1.51*  CALCIUM 8.4*   < > 7.8*   < > 7.7* 8.1* 8.3* 8.2* 8.1*  MG 2.2  --  2.0  --   --   --  1.8 2.1  --   PHOS 2.5  --  2.4*  --   --   --   --   --   --    < > = values in this interval not displayed.    GFR: Estimated Creatinine Clearance: 55.9 mL/min (A) (by C-G formula based on SCr of 1.51 mg/dL (H)).  Liver Function Tests: Recent Labs  Lab 05/19/20 0245 05/20/20 0744 05/21/20 0314 05/22/20 0310 05/23/20 0240  AST 10* 13* 12* 15 14*  ALT 11 11 12 13 12   ALKPHOS 39 41 39 45 50  BILITOT 0.6 0.8 1.1 0.7 0.9  PROT 6.5 6.8 6.8 7.0 7.1  ALBUMIN 2.3* 2.4* 2.5* 2.6* 2.8*    CBG: Recent Labs  Lab 05/22/20 1122 05/22/20 1643 05/22/20 2119 05/23/20 0738 05/23/20 1146  GLUCAP 188* 257* 165* 159* 240*     Recent Results (from the past 240 hour(s))  Culture, blood (routine x 2)     Status: None   Collection Time: 05/14/20  5:00 AM   Specimen: BLOOD  Result Value Ref Range Status   Specimen Description   Final    BLOOD RIGHT ANTECUBITAL Performed at [facility], [place] [address] [place], [place] [address]    Special Requests   Final    BOTTLES DRAWN AEROBIC AND ANAEROBIC Blood Culture adequate volume Performed at [facility] [address] [place], [place] [address]    Culture   Final    NO GROWTH 5 DAYS Performed at [facility], [place] [address], [place], [place] [address]    Report Status 05/19/2020 FINAL  Final  Culture, blood (routine x 2)     Status: None   Collection Time: 05/14/20  5:05 AM   Specimen: BLOOD  Result Value Ref Range Status   Specimen Description   Final    BLOOD RIGHT ANTECUBITAL Performed at [facility] [address], [place], [place] [address]     Special Requests   Final    BOTTLES DRAWN AEROBIC AND ANAEROBIC Blood Culture adequate volume Performed at [facility] [address], [place], [place] [address]    Culture   Final    NO GROWTH 5 DAYS Performed at [facility], [place] [address], [place], [place] [address]    Report Status 05/19/2020 FINAL  Final  MRSA PCR Screening     Status: Abnormal   Collection Time: 05/14/20  6:32 PM   Specimen: Nasopharyngeal  Result Value Ref Range Status   MRSA by PCR POSITIVE (A) NEGATIVE Final    Comment:        The GeneXpert MRSA Assay (FDA approved for NASAL specimens only), is one component of a comprehensive MRSA colonization surveillance program. It is not intended to diagnose MRSA infection nor to guide or monitor treatment for MRSA infections. RESULT CALLED TO, READ BACK BY AND VERIFIED WITH: Gavin Potters, K. RN @1028  ON 10.5.2021 BY [facility] Performed at [facility], [place] [address] [place], [place] [address]   Culture, blood (routine x 2)     Status: None (Preliminary result)   Collection Time: 05/21/20  9:42 AM   Specimen: BLOOD  Result Value Ref Range Status   Specimen Description   Final    BLOOD RIGHT ARM Performed at [facility] [address] [place], [place] [address]    Special Requests   Final    BOTTLES DRAWN AEROBIC ONLY Blood Culture  results may not be optimal due to an inadequate volume of blood received in culture bottles Performed at [facility], [place] [address] [place], [place] [address]    Culture   Final    NO GROWTH 2 DAYS Performed at [facility] [address] [place], [place] [address]    Report Status PENDING  Incomplete  Culture, Urine     Status: None   Collection Time: 05/21/20  9:45 AM   Specimen: Urine, Clean Catch  Result Value Ref Range Status   Specimen Description   Final    URINE, CLEAN CATCH Performed at [facility], [place] [address] [place], [place] [address]    Special Requests   Final    NONE Performed at [facility], [place] [address], [place], [place] [address]    Culture   Final    NO GROWTH Performed at [facility], [place] [address] [place], [place] [address]    Report Status 05/22/2020 FINAL  Final  Culture, blood (routine x 2)     Status: None (Preliminary result)   Collection Time: 05/21/20  9:58 AM   Specimen: BLOOD LEFT HAND  Result Value Ref Range Status   Specimen Description   Final    BLOOD LEFT HAND Performed at [facility] [address] [place], [place] [address]    Special Requests   Final    BOTTLES DRAWN AEROBIC ONLY Blood Culture adequate volume Performed at [facility] [address] [place], [place] [address]    Culture   Final    NO GROWTH 2 DAYS Performed at [facility] [address], [place],  [address]    Report Status PENDING  Incomplete         Radiology Studies: DG CHEST PORT 1 VIEW  Result Date: 05/22/2020 CLINICAL DATA:  Fever EXAM: PORTABLE CHEST 1 VIEW COMPARISON:  May 17, 2020 FINDINGS: There is atelectatic change in the region of the right minor fissure. Lungs elsewhere are clear. Heart is borderline enlarged with pulmonary vascularity normal. No adenopathy. No bone lesions. Occasional foci of carotid artery calcification noted bilaterally. IMPRESSION: Atelectatic change right minor fissure region. Lungs otherwise clear. Stable cardiac silhouette. Calcification noted in each carotid artery. Electronically Signed   By: Lowella Grip III M.D.   On: 05/22/2020 09:13        Scheduled Meds: . carvedilol  25 mg Oral BID WC  . Chlorhexidine Gluconate Cloth  6 each Topical Daily  . furosemide  60 mg Intravenous Q12H  . hydrALAZINE  10 mg Oral Q8H  . insulin aspart  0-9 Units Subcutaneous TID AC & HS  . insulin glargine  5 Units Subcutaneous Daily  .  isosorbide mononitrate  15 mg Oral Daily  . multivitamin with minerals  1 tablet Oral Daily  . predniSONE  5 mg Oral Q breakfast  . sodium chloride flush  5 mL Intracatheter Q8H   Continuous Infusions: . cefTRIAXone (ROCEPHIN)  IV Stopped (05/22/20 1814)     LOS: 10 days   Time spent: 23mins, case discussed with interventional radiology and urology Greater than 50% of this time was spent in counseling, explanation of diagnosis, planning of further management, and coordination of care.  I have personally reviewed and interpreted on  05/23/2020 daily labs, tele strips, I reviewed all nursing notes, pharmacy notes, consultant notes,  vitals, pertinent old records  I have discussed plan of  care as described above with RN , patient on 05/23/2020  Voice Recognition /Dragon dictation system was used to create this note, attempts have been made to correct errors. Please contact the author with questions and/or clarifications.   Florencia Reasons, MD PhD FACP [facility]  Available via Epic secure chat 7am-7pm for nonurgent issues Please page for urgent issues To page the attending provider between 7A-7P or the covering provider during after hours 7P-7A, please log into the web site [email or web address] and access using universal [place] password for that web site. If you do not have the password, please call the hospital operator.    05/23/2020, 1:01 PM

## 2020-05-23 NOTE — Progress Notes (Signed)
Pt refusing CPAP QHS at this time.  

## 2020-05-23 NOTE — TOC Progression Note (Signed)
Transition of Care [facility]) - Progression Note    Patient Details  Name: EKIN PILAR MRN: [id number] Date of Birth: 11/18/1955  [facility] CM/SW Contact  Leeroy Cha, RN Phone Number: 05/23/2020, 8:21 AM  Clinical Narrative:    Temp-100, wbc-15.3,bld cutlure x2x2d=neg,Iv lasix 60mg  [id number], iv rocephin. Following p.t. for recommendations as to home care-none at this time. Following for progression.   Expected Discharge Plan: Home/Self Care Barriers to Discharge: Continued Medical Work up  Expected Discharge Plan and Services Expected Discharge Plan: Home/Self Care   Discharge Planning Services: CM Consult   Living arrangements for the past 2 months: Single Family Home                                       Social Determinants of Health (SDOH) Interventions    Readmission Risk Interventions Readmission Risk Prevention Plan 12/28/2019  Transportation Screening Complete  PCP or Specialist Appt within 3-5 Days Complete  HRI or [place] Complete  Social Work Consult for [place] Planning/Counseling Complete  Palliative Care Screening Not Applicable  Medication Review Press photographer) Complete  Some recent data might be hidden

## 2020-05-24 ENCOUNTER — Other Ambulatory Visit (HOSPITAL_COMMUNITY): Payer: Self-pay | Admitting: Internal Medicine

## 2020-05-24 DIAGNOSIS — N1831 Chronic kidney disease, stage 3a: Secondary | ICD-10-CM

## 2020-05-24 DIAGNOSIS — I480 Paroxysmal atrial fibrillation: Secondary | ICD-10-CM

## 2020-05-24 DIAGNOSIS — E119 Type 2 diabetes mellitus without complications: Secondary | ICD-10-CM

## 2020-05-24 DIAGNOSIS — D849 Immunodeficiency, unspecified: Secondary | ICD-10-CM

## 2020-05-24 DIAGNOSIS — Z794 Long term (current) use of insulin: Secondary | ICD-10-CM

## 2020-05-24 LAB — GLUCOSE, CAPILLARY
Glucose-Capillary: 155 mg/dL — ABNORMAL HIGH (ref 70–99)
Glucose-Capillary: 167 mg/dL — ABNORMAL HIGH (ref 70–99)

## 2020-05-24 LAB — CBC
HCT: 29.6 % — ABNORMAL LOW (ref 39.0–52.0)
Hemoglobin: 9.3 g/dL — ABNORMAL LOW (ref 13.0–17.0)
MCH: 28.8 pg (ref 26.0–34.0)
MCHC: 31.4 g/dL (ref 30.0–36.0)
MCV: 91.6 fL (ref 80.0–100.0)
Platelets: 456 10*3/uL — ABNORMAL HIGH (ref 150–400)
RBC: 3.23 MIL/uL — ABNORMAL LOW (ref 4.22–5.81)
RDW: 20.6 % — ABNORMAL HIGH (ref 11.5–15.5)
WBC: 16.2 10*3/uL — ABNORMAL HIGH (ref 4.0–10.5)
nRBC: 0 % (ref 0.0–0.2)

## 2020-05-24 LAB — BASIC METABOLIC PANEL
Anion gap: 12 (ref 5–15)
BUN: 14 mg/dL (ref 8–23)
CO2: 28 mmol/L (ref 22–32)
Calcium: 8.3 mg/dL — ABNORMAL LOW (ref 8.9–10.3)
Chloride: 98 mmol/L (ref 98–111)
Creatinine, Ser: 1.29 mg/dL — ABNORMAL HIGH (ref 0.61–1.24)
GFR, Estimated: 58 mL/min — ABNORMAL LOW (ref >60)
Glucose, Bld: 172 mg/dL — ABNORMAL HIGH (ref 70–99)
Potassium: 3.2 mmol/L — ABNORMAL LOW (ref 3.5–5.1)
Sodium: 138 mmol/L (ref 135–145)

## 2020-05-24 LAB — MAGNESIUM: Magnesium: 1.8 mg/dL (ref 1.7–2.4)

## 2020-05-24 MED ORDER — ASPIRIN 81 MG PO TBEC: 81.0000 mg | DELAYED_RELEASE_TABLET | Freq: Every day | ORAL | Status: DC

## 2020-05-24 MED ORDER — FERROUS SULFATE 325 (65 FE) MG PO TABS
325.0000 mg | ORAL_TABLET | Freq: Every day | ORAL | 0 refills | Status: DC
Start: 2020-05-24 — End: 2020-06-27

## 2020-05-24 MED ORDER — INSULIN ASPART 100 UNIT/ML FLEXPEN: PEN_INJECTOR | SUBCUTANEOUS | 0 refills | Status: DC

## 2020-05-24 MED ORDER — CEPHALEXIN 500 MG PO CAPS: 500.0000 mg | ORAL_CAPSULE | Freq: Three times a day (TID) | ORAL | 0 refills | Status: DC

## 2020-05-24 MED ORDER — COSENTYX 150 MG/ML ~~LOC~~ SOSY: 150.0000 mg | PREFILLED_SYRINGE | SUBCUTANEOUS | Status: DC

## 2020-05-24 MED ORDER — INSULIN LISPRO PROT & LISPRO (75-25 MIX) 100 UNIT/ML KWIKPEN: 100.0000 [IU] | PEN_INJECTOR | Freq: Every day | SUBCUTANEOUS | Status: DC

## 2020-05-24 MED ORDER — CEPHALEXIN 500 MG PO CAPS: 500.0000 mg | ORAL_CAPSULE | Freq: Three times a day (TID) | ORAL | Status: DC

## 2020-05-24 MED ORDER — POTASSIUM CHLORIDE CRYS ER 20 MEQ PO TBCR
40.0000 meq | EXTENDED_RELEASE_TABLET | Freq: Once | ORAL | Status: AC
  Administered 2020-05-24: 40 meq via ORAL
  Filled 2020-05-24: qty 2

## 2020-05-24 MED FILL — CEPHALEXIN 500 MG CAPSULE: 500 | 4 days supply | Qty: 12 | Fill #0

## 2020-05-24 NOTE — Progress Notes (Signed)
IR.  History of persistent perinephric abscess s/p perinephric abscess drain placement in IR 05/13/2020; s/p removal of drain 05/23/2020.  Labs reviewed this AM- hgb grossly stable at 9.3 (up from 9.0 yesterday). From IR standpoint, patient stable for discharge. Further plans per TRH/urology- appreciate and agree with management.  Please call IR with questions/concerns.   Bea Graff Ivy Puryear, [name] 05/24/2020, 9:06 AM

## 2020-05-24 NOTE — Care Management Important Message (Signed)
Important Message  Patient Details IM Letter given to the Patient Name: Marco [name] MRN: [id number] Date of Birth: 12/14/1955   Medicare Important Message Given:  Yes     Kerin Salen 05/24/2020, 10:40 AM

## 2020-05-24 NOTE — Plan of Care (Signed)

## 2020-05-24 NOTE — Discharge Summary (Signed)
Discharge Summary  Marco [name] BKO:[id number] DOB: 08-Sep-1955  PCP: Marcine Matar, MD  Admit date: 05/12/2020 Discharge date: 05/24/2020  Time spent: , more than 50% time spent on coordination of care.   Recommendations for Outpatient Follow-up:  1. F/u with PCP  for hospital discharge follow up, repeat cbc/bmp at follow up 2. Follow-up with urology Dr. Arita Miss 3. Follow-up with cardiology Dr. Gala Romney 4. Follow-up with endocrinology Dr. Franz Dell  Discharge Diagnoses:  Active Hospital Problems   Diagnosis Date Noted  . Severe sepsis (HCC) 12/10/2019  . Perinephric abscess 05/13/2020  . Pyohydronephrosis 05/13/2020  . Nephrolithiasis 12/30/2019  . Acute metabolic encephalopathy 12/09/2019  . Acute renal failure superimposed on stage 3 chronic kidney disease (HCC) 08/27/2017  . Chronic combined systolic and diastolic CHF (congestive heart failure) (HCC) 09/09/2016  . Type 2 diabetes mellitus with stage 3 chronic kidney disease, without long-term current use of insulin (HCC)   . Rheumatoid arthritis (HCC) 02/05/2015  . Atrial fibrillation (HCC) 12/24/2014  . Essential hypertension 12/24/2014  . Elevated troponin 12/24/2014    Resolved Hospital Problems   Diagnosis Date Noted Date Resolved  . Hyperkalemia 12/09/2019 05/14/2020    Discharge Condition: stable  Diet recommendation: heart healthy/carb modified  Filed Weights   05/23/20 0500 05/24/20 0535 05/24/20 0900  Weight: 107.1 kg 100.7 kg 101.4 kg    History of present illness: (Per admitting provider Dr. Julian Reil ) Chief Complaint: AMS  HPI: Marco [name] is a 64 y.o. male with medical history significant of DM2, HTN, CHF (EF most recently 50-55% in May, grade 1 DD), RA on biologic therapy.  Pt was admitted in April / May most recently.  Had sepsis, hypotension at that time secondary to UTI with a R perinephric abscess (3.9cm at that time).  Drainage of perinephric abscess in late May grew out  E.Coli sensitive to cefepime, rocephin.  Looks like he had an MRI abdomen to follow up in Aug which demonstrated worsening of the R paranephric abscess (see MR result from that time).  Patient presents to the ED with AMS, patient not able to provide much in the way of history due to lethargy.  Pt's wife notes he has had ongoing back / flank pain since discharge in May.  Patient at baseline is AAOx3, looks like he was this as well on 10/1 when he had a virtual visit with Dr. Gala Romney: "Says he is doing ok. Main issues are leg and back pain. No edema, orthopnea or PND. No CP. Breathing improved. Only SOB up steps or carrying things."  ED Course: Tm 105.2 on presentation, has improved to 100.8 most recently  BP unfortunatly has trended down from 131/96 to 80/60 despite ongoing IVF.  Under 2L out of 3L bolus given at this time.  WBC 23.6k.  Pt is vaccinated to COVID Probation officer).  Creat 2.3 up from 1.3 baseline.  UA is grossly infected urine.  CT reveals: 1) progression of R renal abscess, now has gas 2) new hypodense liver lesions, abscess vs mets (im suspecting abscesses). 3) mild L hydro with small 67mm obstructing stone  Urology consulted, hospitalist asked to admit.  Hospital Course:  Principal Problem:   Severe sepsis (HCC) Active Problems:   Elevated troponin   Atrial fibrillation (HCC)   Essential hypertension   Rheumatoid arthritis (HCC)   Type 2 diabetes mellitus with stage 3 chronic kidney disease, without long-term current use of insulin (HCC)   Chronic combined systolic and diastolic CHF (congestive heart  failure) ([place])   Acute renal failure superimposed on stage 3 chronic kidney disease ([place])   Acute metabolic encephalopathy   Nephrolithiasis   Perinephric abscess   Pyohydronephrosis   Sever sepsis in an immunosuppressed patient  -Sepsis presents on admission with fever tachycardia, tachypnea, and leukocytosis, lactic acidosis, acute metabolic  encephalopathy, source of infection urinary/perinephric abscess/bacteremia -Initial urine culture/blood culture and urine culture all positive for E. Coli -He is treated with IV Rocephin, status post drain placement with IR on October 3, discontinued on October 13. repeat blood culture no growth, sepsis resolved  -he received 10 days of antibiotic in the hospital, will discharge on Keflex for 4 more days  Retroperitoneal hematoma/acute blood loss anemia -After drain placement -Status post 2 unit PRBC transfusion on October 8 , hemoglobin has been stable posttransfusion  -Aspirin on hold in the hospital, continue hold at discharge, f/u with pcp to recheck cbc, resume when ok with pcp  Nephrolithiasis -CT reveals 4 mm left-sided ureteral stone -Urology continue following, initially there is a concerning needing a left sided PCN if renal function does not improve, renal function has improved, he is cleared to discharge home by urology -Urology will set up follow-up appointment  Urethral stricture Required Foley catheter in the hospital , voiding trial on October 14 is successful  He is discharged home with close urology follow-up   AKI on CKD 3 a -Creatinine peaked at 2.06 -Creatinine 1.29 at discharge, AKI resolved -Renal dosing meds  Acute on chronic combined CHF -2d echo reviewed, findings of EF of 25-30% -Chart reviewed. Pt is well known to heart failure service as outpatient and noted to have variable EF heart failure with EF of 25% in 2018, more recently 50% several months ago. Pt is off entresto secondary to hypotension and off hydralazine. Pt is off Jardiance secondary to n/v.  -tafamidis also listed on home med list,  -he is on Coreg 25 mg twice a day, IV Lasix 60 mg twice daily, hydralazine 10 mg every 8 hours, Imdur 15 mg daily after sepsis resolved, blood pressure stable in the hospital prior to discharge -He is on room air at rest , lower extremity edema has resolved at  discharge -Blood pressure stable,  -Patient stated that he was instructed to restart Entresto by cardiology, I reviewed his Dr. Edythe Lynn notes from October 1 which did start to start [facility] . his blood pressure is stable, renal functions improved, will discharge on entresto, coreg 12.5mg  bid, lasix 80mg  daily , imdur 30mg  daily, patient is advised to check blood pressure at home, bring blood pressure record to cardiology follow-up appointment.  Paroxysmal A. Fib -Currently sinus rhythm -He is not on anticoagulation at home -Aspirin held due to retroperitoneal hematoma, resume if repeat cbc stable after discharge -He is discharged on Coreg  Insulin-dependent type 2 diabetes, fairly controlled -Reported on Humalog 100unit at home -He only required Lantus 5unit daily with SSI, and the last few days in the hospital, a.m. blood glucose 172, presumably due to poor oral intake in the hospital - A1c pending at discharge -Expect patient will eat better at home, advised to start with Humalog 30 units daily, prescribed sliding scale, advised to check blood sugar 3 times a day and close follow-up with endocrinology Dr. Ebony Hail in 1 week for further insulin dose adjustment  Rheumatoid arthritis/chronic immunosuppressed status -Continuing to hold secukinumab given active infection -was started on steroids as patient is on chronic prednisone 5 mg daily at home as well -Follow-up  with rheumatology   DVT prophylaxis: Place and maintain sequential compression device Start: 05/16/20 0942  Code Status: Full Family Communication: wife at bedside Disposition:   Status is: Inpatient    Dispo: The patient is from: Home  Anticipated d/c is to: Home   Consultants:   Urology  Interventional radiology  Procedures:   Right PCN placement and removal  Foley placement and removal  PRBC transfusion x2  Antimicrobials:   Rocephin   Discharge Exam: BP 130/68    Pulse 64   Temp 99.2 F (37.3 C) (Oral)   Resp 15   Ht $R'5\' 6"'Jt$  (1.676 m)   Wt 101.4 kg   SpO2 98%   BMI 36.08 kg/m   General: NAD Cardiovascular: RRR Respiratory: CTABL  Discharge Instructions You were cared for by a hospitalist during your hospital stay. If you have any questions about your discharge medications or the care you received while you were in the hospital after you are discharged, you can call the unit and asked to speak with the hospitalist on call if the hospitalist that took care of you is not available. Once you are discharged, your primary care physician will handle any further medical issues. Please note that NO REFILLS for any discharge medications will be authorized once you are discharged, as it is imperative that you return to your primary care physician (or establish a relationship with a primary care physician if you do not have one) for your aftercare needs so that they can reassess your need for medications and monitor your lab values.  Discharge Instructions    Diet - low sodium heart healthy   Complete by: As directed    Carb modified diet   Increase activity slowly   Complete by: As directed    No wound care   Complete by: As directed      Allergies as of 05/24/2020      Reactions   Nsaids Other (See Comments)   Stomach pains. Ulcers - stated by patient    Jardiance [empagliflozin] Nausea And Vomiting      Medication List    TAKE these medications   Accu-Chek FastClix Lancets Misc 1 each by Other route 2 (two) times daily. E11.42   Accu-Chek Guide test strip Generic drug: glucose blood USE 1 STRIP TWICE DAILY TO CHECK BLOOD GLUCOSE   albuterol 108 (90 Base) MCG/ACT inhaler Commonly known as: VENTOLIN HFA Inhale 2 puffs into the lungs every 6 (six) hours as needed for wheezing or shortness of breath.   allopurinol 100 MG tablet Commonly known as: ZYLOPRIM Take 200 mg by mouth every evening.   aspirin 81 MG EC tablet Commonly known as: EQ  Aspirin Adult Low Dose Take 1 tablet (81 mg total) by mouth daily. TAKE 1 TABLET BY MOUTH ONCE DAILY. SWALLOW WHOLE Please hold asa until you repeat blood work, resume if blood count stable and ok with you primary care doctor What changed:   how much to take  how to take this  when to take this  additional instructions   atorvastatin 40 MG tablet Commonly known as: LIPITOR Take 1 tablet (40 mg total) by mouth daily.   carvedilol 12.5 MG tablet Commonly known as: COREG Take 1 tablet (12.5 mg total) by mouth 2 (two) times daily with a meal.   cephALEXin 500 MG capsule Commonly known as: KEFLEX Take 1 capsule (500 mg total) by mouth every 8 (eight) hours for 4 days.   Cosentyx 150 MG/ML Sosy Generic drug:  Secukinumab Inject 150 mg into the skin every 28 (twenty-eight) days. Hold until told to resume by your rheumatologist What changed: additional instructions   cyclobenzaprine 5 MG tablet Commonly known as: FLEXERIL Take 5 mg by mouth every 12 (twelve) hours.   diclofenac Sodium 1 % Gel Commonly known as: Voltaren Apply 4 g topically 4 (four) times daily. What changed:   when to take this  reasons to take this   DULoxetine 20 MG capsule Commonly known as: CYMBALTA TAKE 1 CAPSULE BY MOUTH ONCE DAILY   Entresto 24-26 MG Generic drug: sacubitril-valsartan Take 1 tablet by mouth 2 (two) times daily.   famotidine 20 MG tablet Commonly known as: PEPCID TAKE 1 TABLET BY MOUTH TWO TIMES DAILY   ferrous sulfate 325 (65 FE) MG tablet Take 1 tablet (325 mg total) by mouth daily with breakfast. What changed: when to take this   furosemide 80 MG tablet Commonly known as: LASIX Take 80 mg by mouth daily.   insulin aspart 100 UNIT/ML FlexPen Commonly known as: NOVOLOG Before each meal 3 times a day, 140-199 - 2 units, 200-250 - 4 units, 251-299 - 6 units,  300-349 - 8 units,  350 or above 10 units. Insulin PEN if approved, provide syringes and needles if needed.     Insulin Lispro Prot & Lispro (75-25) 100 UNIT/ML Kwikpen Commonly known as: HumaLOG Mix 75/25 KwikPen Inject 100 Units into the skin daily with breakfast. Start with 30units daily, please check blood glucose three times a day and using sliding scale insulin if indicated per sliding scale, please see Dr Loanne Drilling in one week What changed: additional instructions   Insulin Pen Needle 32G X 4 MM Misc 1 each by Does not apply route daily. E11.9   isosorbide mononitrate 30 MG 24 hr tablet Commonly known as: IMDUR Take 30 mg by mouth daily.   naloxone 4 MG/0.1ML Liqd nasal spray kit Commonly known as: NARCAN Place in nostril in the event of suspected overdose on Narcotic medication (Oxycodone)   oxyCODONE 5 MG immediate release tablet Commonly known as: Oxy IR/ROXICODONE Take 5 mg by mouth 3 (three) times daily as needed for moderate pain.   polyethylene glycol 17 g packet Commonly known as: MIRALAX / GLYCOLAX Take 17 g by mouth daily as needed for moderate constipation or severe constipation.   Potassium Chloride ER 20 MEQ Tbcr Take 20 mEq by mouth daily.   predniSONE 5 MG tablet Commonly known as: DELTASONE Take 5 mg by mouth daily.   pregabalin 100 MG capsule Commonly known as: LYRICA Take 100 mg by mouth 3 (three) times daily.   ramelteon 8 MG tablet Commonly known as: ROZEREM TAKE 1 TABLET BY MOUTH AT BEDTIME   Vyndamax 61 MG Caps Generic drug: Tafamidis Take 61 mg by mouth daily.      Allergies  Allergen Reactions  . Nsaids Other (See Comments)    Stomach pains. Ulcers - stated by patient   . Jardiance [Empagliflozin] Nausea And Vomiting    Follow-up Information    Bensimhon, Shaune Pascal, MD Follow up on 06/27/2020.   Specialty: Cardiology Why: Please arrive at 10:20 am for a 10:40 am appt.  please check your blood pressure twice a day at home, bring in blood pressure record for Dr [name] to review. Contact information: [place] [address] [phone]        Robley Fries, MD Follow up.   Specialty: Urology Contact information: [place]  [address] [place] [address] [phone]        [name], MD Follow up on 06/11/2020.   Specialty: Internal Medicine Why: hosptial discharge follow up, repeat cbc/bmp Contact information: [place] [address] [phone]        Renato Shin, MD Follow up in 1 week(s).   Specialty: Endocrinology Why: please check blood glucose three times a day, bring in record for Dr Loanne Drilling to review and further adjust insulin. Contact information: [address] [place] [address] [phone]                The results of significant diagnostics from this hospitalization (including imaging, microbiology, ancillary and laboratory) are listed below for reference.    Significant Diagnostic Studies: CT ABDOMEN PELVIS WO CONTRAST  Result Date: 05/18/2020 CLINICAL DATA:  Retroperitoneal hemorrhage, retroperitoneal abscess EXAM: CT ABDOMEN AND PELVIS WITHOUT CONTRAST TECHNIQUE: Multidetector CT imaging of the abdomen and pelvis was performed following the standard protocol without IV contrast. COMPARISON:  05/15/2020 FINDINGS: Lower chest: Trace left and small right pleural effusions are present, decreased in size since prior examination. Bibasilar atelectasis is again identified. Minimal coronary artery calcification. Global cardiac size within normal limits. Hepatobiliary: Wedge like regions of a macroscopic fat within the left hepatic lobe are stable, and unchanged from prior MRI examination of 03/22/2020. Mild asymmetric parenchymal thickening and perihepatic inflammatory stranding adjacent to the drained pararenal abscess is again identified and appears stable since prior examination, not well assessed on this noncontrast examination. Vicarious excretion of contrast noted within the gallbladder lumen.  The gallbladder is otherwise unremarkable. No intra or extrahepatic biliary ductal dilation. Pancreas: Unremarkable Spleen: Unremarkable Adrenals/Urinary Tract: The adrenal glands are unremarkable. Left kidney is normal in size and position. Exophytic renal cyst is again noted arising from the lower pole of the left kidney. Percutaneous drainage catheter is again seen coursing into the pararenal space adjacent to the upper pole of the right kidney. Large subcapsular hematoma is again identified involving the right kidney with infiltrative high density material compatible with blood extending throughout the pararenal space and layering along Gerota's fascia. This appears stable since prior examination. When measured in similar fashion, the hematoma measures 8.7 x 5.6 cm in dimension. No hydronephrosis. No intrarenal or ureteral calculi. The bladder is decompressed with a Foley catheter balloon seen within its lumen. Stomach/Bowel: Stomach, small bowel, are unremarkable. Surgical changes of right hemicolectomy are identified the residual large bowel is unremarkable save for mild scattered sigmoid diverticulosis. No free intraperitoneal gas. Trace free fluid within the pelvis is unchanged. Vascular/Lymphatic: Mild aortoiliac atherosclerotic calcification without evidence of aneurysm. No pathologic adenopathy within the abdomen and pelvis. Reproductive: Status post hysterectomy. No adnexal masses. Other: Three rectum unremarkable. Mild subcutaneous edema within the flanks bilaterally is stable. Musculoskeletal: No lytic or blastic bone lesions are identified. IMPRESSION: Stable examination. Drainage catheter noted within the pararenal space adjacent to the upper pole the right kidney. Large subcapsular hematoma with hemorrhagic material seen infiltrating within the pararenal space unchanged from prior examination. Poorly delineated inflammatory changes involving the a posterior right hepatic lobe adjacent to the  previously noted pararenal abscess is again identified and, while not well assessed on this noncontrast examination, appears stable. Electronically Signed   By: Fidela Salisbury MD   On: 05/18/2020 21:59   DG CHEST PORT 1 VIEW  Result Date: 05/22/2020 CLINICAL DATA:  Fever EXAM: PORTABLE CHEST 1 VIEW COMPARISON:  May 17, 2020 FINDINGS:  There is atelectatic change in the region of the right minor fissure. Lungs elsewhere are clear. Heart is borderline enlarged with pulmonary vascularity normal. No adenopathy. No bone lesions. Occasional foci of carotid artery calcification noted bilaterally. IMPRESSION: Atelectatic change right minor fissure region. Lungs otherwise clear. Stable cardiac silhouette. Calcification noted in each carotid artery. Electronically Signed   By: Lowella Grip III M.D.   On: 05/22/2020 09:13   DG CHEST PORT 1 VIEW  Result Date: 05/17/2020 CLINICAL DATA:  Recent aspiration EXAM: PORTABLE CHEST 1 VIEW COMPARISON:  05/16/2020 FINDINGS: Cardiac shadow is enlarged but stable. Tortuous thoracic aorta is seen. Mild thickening of the minor fissure is noted consistent with small right-sided pleural effusion. Drainage catheter is noted in the right upper quadrant. Previously seen bibasilar opacities have improved in the interval from the prior exam. IMPRESSION: Improved aeration in the bases bilaterally. No new focal abnormality is noted. Electronically Signed   By: Inez Catalina M.D.   On: 05/17/2020 20:12   DG CHEST PORT 1 VIEW  Result Date: 05/16/2020 CLINICAL DATA:  Hypoxia. EXAM: PORTABLE CHEST 1 VIEW COMPARISON:  05/13/2020 FINDINGS: 0958 hours. Low volume rotated film. The cardio pericardial silhouette is enlarged. There is pulmonary vascular congestion without overt pulmonary edema. Patchy airspace opacity at the lung bases compatible with atelectasis or infiltrate. No substantial pleural effusion. The visualized bony structures of the thorax show no acute abnormality. Telemetry  leads overlie the chest. IMPRESSION: 1. Cardiomegaly with vascular congestion. 2. Bibasilar atelectasis or infiltrate. Electronically Signed   By: Misty Stanley M.D.   On: 05/16/2020 10:15   DG Chest Port 1 View  Result Date: 05/13/2020 CLINICAL DATA:  Fever EXAM: PORTABLE CHEST 1 VIEW COMPARISON:  May 10, 2020 FINDINGS: The heart size and mediastinal contours are unchanged with mild cardiomegaly. There is streaky airspace opacity at the right base as the CT, likely atelectasis scarring. The left lung is clear. No acute osseous. IMPRESSION: Unchanged airspace opacity at the right base, likely atelectasis or scarring Electronically Signed   By: Prudencio Pair M.D.   On: 05/13/2020 00:34   ECHOCARDIOGRAM COMPLETE  Result Date: 05/16/2020    ECHOCARDIOGRAM REPORT   Patient Name:   Marco [name] Date of Exam: 05/16/2020 Medical Rec #:  [id number]       Height:       66.0 in Accession #:    [id number]      Weight:       234.1 lb Date of Birth:  [birth year]       BSA:          2.138 m Patient Age:    43 years        BP:           175/82 mmHg Patient Gender: M               HR:           96 bpm. Exam Location:  Inpatient Procedure: 2D Echo and Intracardiac Opacification Agent Indications:    I31.3 Pericardial effusion (noninflammatory)  History:        Patient has prior history of Echocardiogram examinations, most                 recent 12/31/2019. Abnormal ECG, Arrythmias:Atrial Fibrillation,                 Signs/Symptoms:Dyspnea, Chest Pain, Altered Mental Status,  Bacteremia and Fever; Risk Factors:Hypertension, Diabetes and                 Sleep Apnea.  Sonographer:    Roseanna Rainbow RDCS Referring Phys: [place]  Sonographer Comments: Technically difficult study due to poor echo windows and patient is morbidly obese. Image acquisition challenging due to patient body habitus. Patient in high fowler's position. IMPRESSIONS  1. Diffuse hypokinesis with abnormal septal motion . Left  ventricular ejection fraction, by estimation, is 25 to 30%. The left ventricle has severely decreased function. The left ventricle demonstrates global hypokinesis. The left ventricular internal cavity size was moderately to severely dilated. There is moderate asymmetric left ventricular hypertrophy of the basal and septal segments. Left ventricular diastolic parameters were normal.  2. Right ventricular systolic function is normal. The right ventricular size is normal.  3. The pericardial effusion is lateral to the left ventricle.  4. The mitral valve is normal in structure. Trivial mitral valve regurgitation. No evidence of mitral stenosis.  5. The aortic valve is normal in structure. Aortic valve regurgitation is not visualized. No aortic stenosis is present.  6. The inferior vena cava is normal in size with greater than 50% respiratory variability, suggesting right atrial pressure of 3 mmHg. FINDINGS  Left Ventricle: Diffuse hypokinesis with abnormal septal motion. Left ventricular ejection fraction, by estimation, is 25 to 30%. The left ventricle has severely decreased function. The left ventricle demonstrates global hypokinesis. Definity contrast agent was given IV to delineate the left ventricular endocardial borders. The left ventricular internal cavity size was moderately to severely dilated. There is moderate asymmetric left ventricular hypertrophy of the basal and septal segments. Left ventricular diastolic parameters were normal. Right Ventricle: The right ventricular size is normal. No increase in right ventricular wall thickness. Right ventricular systolic function is normal. Left Atrium: Left atrial size was normal in size. Right Atrium: Right atrial size was normal in size. Pericardium: Trivial pericardial effusion is present. The pericardial effusion is lateral to the left ventricle. Mitral Valve: The mitral valve is normal in structure. Trivial mitral valve regurgitation. No evidence of mitral valve  stenosis. Tricuspid Valve: The tricuspid valve is normal in structure. Tricuspid valve regurgitation is mild . No evidence of tricuspid stenosis. Aortic Valve: The aortic valve is normal in structure. Aortic valve regurgitation is not visualized. No aortic stenosis is present. Pulmonic Valve: The pulmonic valve was normal in structure. Pulmonic valve regurgitation is not visualized. No evidence of pulmonic stenosis. Aorta: The aortic root is normal in size and structure. Venous: The inferior vena cava is normal in size with greater than 50% respiratory variability, suggesting right atrial pressure of 3 mmHg. IAS/Shunts: The interatrial septum was not well visualized.  LEFT VENTRICLE PLAX 2D LVIDd:         4.90 cm      Diastology LVIDs:         4.20 cm      LV e' medial:    4.90 cm/s LV PW:         1.80 cm      LV E/e' medial:  14.5 LV IVS:        1.60 cm      LV e' lateral:   7.18 cm/s LVOT diam:     2.40 cm      LV E/e' lateral: 9.9 LV SV:         70 LV SV Index:   33 LVOT Area:     4.52 cm  LV Volumes (MOD) LV vol d, MOD A2C: 145.0 ml LV vol d, MOD A4C: 152.0 ml LV vol s, MOD A2C: 89.8 ml LV vol s, MOD A4C: 101.0 ml LV SV MOD A2C:     55.2 ml LV SV MOD A4C:     152.0 ml LV SV MOD BP:      57.1 ml RIGHT VENTRICLE             IVC RV S prime:     17.90 cm/s  IVC diam: 1.50 cm TAPSE (M-mode): 2.5 cm LEFT ATRIUM           Index       RIGHT ATRIUM           Index LA diam:      4.20 cm 1.96 cm/m  RA Area:     11.50 cm LA Vol (A2C): 17.7 ml 8.28 ml/m  RA Volume:   22.70 ml  10.62 ml/m LA Vol (A4C): 40.9 ml 19.13 ml/m  AORTIC VALVE LVOT Vmax:   114.00 cm/s LVOT Vmean:  78.600 cm/s LVOT VTI:    0.154 m  AORTA Ao Root diam: 3.60 cm Ao Asc diam:  3.30 cm MITRAL VALVE MV Area (PHT): 3.31 cm    SHUNTS MV Decel Time: 229 msec    Systemic VTI:  0.15 m MV E velocity: 71.10 cm/s  Systemic Diam: 2.40 cm MV A velocity: 71.35 cm/s MV E/A ratio:  1.00 Jenkins Rouge MD Electronically signed by Jenkins Rouge MD Signature Date/Time:  05/16/2020/12:43:46 PM    Final    CT RENAL STONE STUDY  Addendum Date: 05/16/2020   ADDENDUM REPORT: 05/16/2020 09:04 ADDENDUM: There is an unchanged 58mm proximal left nephrolithiasis. Electronically Signed   By: Valentino Saxon MD   On: 05/16/2020 09:04   Result Date: 05/16/2020 CLINICAL DATA:  Sepsis EXAM: CT ABDOMEN AND PELVIS WITHOUT CONTRAST TECHNIQUE: Multidetector CT imaging of the abdomen and pelvis was performed following the standard protocol without IV contrast. COMPARISON:  05/13/2020. FINDINGS: Lower chest: Small bilateral pleural effusions. Bibasilar consolidative opacities with centrilobular ground-glass nodularity of the RIGHT lower lobe. Hepatobiliary: Unchanged wedge-shaped hypodense lesions of the LEFT liver. Unchanged subcapsular lesion of the inferior RIGHT liver, better assessed on prior MRI. Small amount of fluid tracking along the liver, minimally increased. Excreted contrast versus gallstones within the gallbladder. Pancreas: No new focal fat stranding. Spleen: No new focal abnormality. Adrenals/Urinary Tract: Interval placement of a drain in the RIGHT perinephric abscess. There is new high density material in the Perirenal fossa which distorts the contours of the kidney. High density material tracks in the retroperitoneum. There is a small amount of layering seen within this high density material which could reflect active bleeding. Unchanged hypodense lesion of the LEFT kidney. Adrenals are unremarkable. Bladder is partially decompressed around a Foley catheter. Small amount of intraluminal air consistent with intervention. Stomach/Bowel: No evidence of bowel obstruction. Diverticulosis without evidence of diverticulitis. Status post RIGHT colonic resection. Vascular/Lymphatic: Atherosclerotic calcifications of the aorta. No new suspicious lymph nodes. Reproductive: Coarse calcifications of the prostate. Other: Small fat containing inguinal hernias. Small amount of free fluid.  Musculoskeletal: Remote LEFT rib fracture of the posterior twelfth rib. IMPRESSION: 1. Interval placement of a drain in the RIGHT perinephric abscess. There is new high density material in the perirenal fossa which distorts the kidney (Page kidney morphology). Blood products track in the retroperitoneum. There is a small amount of layering blood products seen which could reflect active bleeding. Consider dedicated  contrast enhanced CT to evaluate for active extravasation or pseudoaneurysm formation. 2. Small bilateral pleural effusions. 3. Bibasilar consolidative opacities with centrilobular ground-glass nodularity of the RIGHT lower lobe. Findings may reflect aspiration superimposed on atelectasis. Differential considerations include infection. These results were called by telephone at the time of interpretation on 05/15/2020 at 12:37 pm to provider DAVID GIRGUIS , who verbally acknowledged these results. Aortic Atherosclerosis (ICD10-I70.0). Electronically Signed: By: Valentino Saxon MD On: 05/15/2020 12:47   CT Renal Stone Study  Result Date: 05/13/2020 CLINICAL DATA:  Fever, question of UTI EXAM: CT ABDOMEN AND PELVIS WITHOUT CONTRAST TECHNIQUE: Multidetector CT imaging of the abdomen and pelvis was performed following the standard protocol without IV contrast. COMPARISON:  MRI of 07/30/2020 FINDINGS: Lower chest: The visualized heart size within normal limits. There is a small pericardial effusion present. Trace bilateral pleural effusions/atelectasis is noted. Hepatobiliary: There are new heterogeneously hypodense lesions seen within the liver parenchyma. The largest in the posterior right liver lobe measures 5.7 cm. Within the anterior left liver lobe there to hypodense lesions the largest measuring 3.4 cm. No evidence of calcified gallstones or biliary ductal dilatation. Pancreas:  Unremarkable.  No surrounding inflammatory changes. Spleen: Normal in size. Although limited due to the lack of  intravenous contrast, normal in appearance. Adrenals/Urinary Tract: Both adrenal glands appear normal. Again noted is a complex collection seen posterior to the right kidney measuring 4.5 cm in transverse dimension. There is heterogeneous fluid seen within the central portion and small foci of air now noted. A punctate calcifications seen in the upper pole of the right kidney. No hydronephrosis. There is mild left-sided perinephric fat stranding changes. Within the proximal left ureter there is a 4 mm calculus present. A 2 cm low-density lesion seen off the lower pole the left kidney. Stomach/Bowel: The stomach, small bowel, and colon are normal in appearance. No inflammatory changes or obstructive findings. Scattered colonic diverticula are noted. A moderate amount of colonic stool is present. Vascular/Lymphatic: There are no enlarged abdominal or pelvic lymph nodes. Scattered aortic atherosclerotic calcifications are seen without aneurysmal dilatation. Reproductive: The prostate is unremarkable. Other: No evidence of abdominal wall mass or hernia. Musculoskeletal: No acute or significant osseous findings. IMPRESSION: Heterogeneous complex collection posterior to the right kidney now with new foci of air, however appears grossly unchanged in size from the prior exam dating back to August 2021. This likely represents of para renal abscess given the patient's history. New/enlarging hypodense lesions seen throughout the liver parenchyma which could represent hepatic abscesses versus metastatic disease. Mild left perinephric inflammatory changes, likely due to a proximal left 4 mm renal calculus. Small pericardial effusion Electronically Signed   By: Prudencio Pair M.D.   On: 05/13/2020 02:42   CT IMAGE GUIDED FLUID DRAIN BY CATHETER  Result Date: 05/13/2020 INDICATION: 64 year old male with recurrence/persisting abscess in the right perinephric space. He presents with sepsis and has been referred for percutaneous  drainage EXAM: CT GUIDED DRAINAGE OF  ABSCESS MEDICATIONS: The patient is currently admitted to the hospital and receiving intravenous antibiotics. The antibiotics were administered within an appropriate time frame prior to the initiation of the procedure. ANESTHESIA/SEDATION: 0.5 mg IV Versed 25 mcg IV Fentanyl Moderate Sedation Time:  12 minutes The patient was continuously monitored during the procedure by the interventional radiology nurse under my direct supervision. COMPLICATIONS: None TECHNIQUE: Informed written consent was obtained from the patient after a thorough discussion of the procedural risks, benefits and alternatives. All questions were addressed. Maximal Sterile  Barrier Technique was utilized including caps, mask, sterile gowns, sterile gloves, sterile drape, hand hygiene and skin antiseptic. A timeout was performed prior to the initiation of the procedure. PROCEDURE: Patient was positioned left decubitus on the CT table. Scout CT was acquired. The right flank was prepped with chlorhexidine in a sterile fashion, and a sterile drape was applied covering the operative field. A sterile gown and sterile gloves were used for the procedure. Local anesthesia was provided with 1% Lidocaine. Once the patient is prepped and draped in the usual sterile fashion, CT guidance was used to advance a trocar needle into the phlegmon of the right perinephric space. Once we confirmed needle tip position with aspiration of purulent fluid, wire was placed and modified Seldinger technique was used to place a 10 [place] drain. Drain position was optimized with serial CT imaging. Drain was then sutured in position attached to bulb suction. Sample was sent to lab for culture. Patient tolerated the procedure well and remained hemodynamically stable throughout. No complications were encountered and no significant blood loss. FINDINGS: Ten French drain into the left perinephric abscess. Uncertain if there is communication  between the suspected perihepatic abscess and the perinephric collection. IMPRESSION: Status post CT-guided drainage of right perinephric abscess. Signed, Dulcy Fanny. Dellia Nims, RPVI [facility] Radiology Electronically Signed   By: Corrie Mckusick D.O.   On: 05/13/2020 09:07   CT Angio Abd/Pel w/ and/or w/o  Result Date: 05/15/2020 CLINICAL DATA:  64 year old male with history of right perinephric fluid collection status post drainage on 05/13/2020 now with perinephric and retroperitoneal hemorrhage. Evaluate for active extravasation. EXAM: CT ANGIOGRAPHY ABDOMEN AND PELVIS WITH CONTRAST AND WITHOUT CONTRAST TECHNIQUE: Multidetector CT imaging of the abdomen and pelvis was performed using the standard protocol during bolus administration of intravenous contrast. Multiplanar reconstructed images and MIPs were obtained and reviewed to evaluate the vascular anatomy. CONTRAST:  60mL OMNIPAQUE IOHEXOL 350 MG/ML SOLN COMPARISON:  MRI abdomen from 03/22/2020, CT abdomen pelvis from 05/13/2020, CT-guided drain placement from 05/13/2020, and non contrasted CT abdomen pelvis from the same day. FINDINGS: VASCULAR Aorta: Scattered atherosclerotic calcifications without signficant stenosis, dissection, or aneurysm. Celiac: Patent without evidence of aneurysm, dissection, vasculitis or significant stenosis. SMA: Patent without evidence of aneurysm, dissection, vasculitis or significant stenosis. Renals: Single bilateral renal arteries are patent without evidence of aneurysm, dissection, vasculitis, fibromuscular dysplasia or significant stenosis. IMA: Patent without evidence of aneurysm, dissection, vasculitis or significant stenosis. Inflow: Patent without evidence of aneurysm, dissection, vasculitis or significant stenosis. Proximal Outflow: Bilateral common femoral and visualized portions of the superficial and profunda femoral arteries are patent without evidence of aneurysm,  dissection, vasculitis or significant stenosis. Veins: No obvious venous abnormality within the limitations of this arterial phase study. Review of the MIP images confirms the above findings. NON-VASCULAR Lower chest: Similar appearing small bilateral pleural effusions, right greater than left with associated passive atelectasis. Similar appearing global cardiomegaly with small, incompletely visualized pericardial effusion. Bilateral symmetric gynecomastia. Hepatobiliary: Similar appearing wedge like hypodense regions in the left lower lobe of the liver. Unchanged ill-defined right posterior decreased hepatic attenuation and wedge like hypodensity in the subcapsular regions of segment 5. The gallbladder is present unremarkable. No intra or extrahepatic biliary ductal dilation. Pancreas: Unremarkable. No pancreatic ductal dilatation or surrounding inflammatory changes. Spleen: Normal in size without focal abnormality. Adrenals/Urinary Tract: The adrenal glands are symmetric in size and enhancement bilaterally. Again seen is a prominent right perinephric hematoma measuring up to 9.4 x  5.3 cm in greatest axial dimension about the lower pole. Similar position of the indwelling pigtail catheter which abuts and potentially is within the right renal cortex. There is a single focus of gas within the anterior and superior aspect of the perinephric hematoma. The hematoma extends into the right retroperitoneum, unchanged from comparison study from approximately 3 hours prior. No evidence of active extravasation. Unchanged simple cysts in the left kidney. No hydronephrosis or nephrolithiasis. Foley catheter is in place within the bladder which is moderately distended. No bladder wall thickening. A doses Aleene Davidson now Stomach/Bowel: Stomach is within normal limits. Appendix appears normal. No evidence of bowel wall thickening, distention, or inflammatory changes. Postsurgical changes after right hemicolectomy. Sigmoid diverticula  without surrounding inflammatory changes. Lymphatic: No abdominopelvic lymphadenopathy Reproductive: Prostate is unremarkable. Other: Unchanged appearance of previously visualized right perinephric and retroperitoneal fluid collection with heterogeneous attenuation, no evidence of active extravasation. Musculoskeletal: No acute or significant osseous findings. IMPRESSION: VASCULAR No acute vascular abnormality. No evidence of active extravasation in the right retroperitoneum. Aortic Atherosclerosis (ICD10-I70.0). NON-VASCULAR 1. Similar appearing right perinephric hematoma extending into the right retroperitoneum without active extravasation, likely related to positioning of the indwelling drainage catheter. 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 2. Similar appearing wedge like hypodensities in the left and right lobes of the liver, indeterminate but could be seen with infarction or abscess formation. Recommend attention on follow-up. 3. Similar appearing small bilateral pleural effusions with associated passive atelectasis. Ruthann Cancer, MD [facility] Radiology Electronically Signed   By: Ruthann Cancer MD   On: 05/15/2020 16:31    Microbiology: Recent Results (from the past 240 hour(s))  MRSA PCR Screening     Status: Abnormal   Collection Time: 05/14/20  6:32 PM   Specimen: Nasopharyngeal  Result Value Ref Range Status   MRSA by PCR POSITIVE (A) NEGATIVE Final    Comment:        The GeneXpert MRSA Assay (FDA approved for NASAL specimens only), is one component of a comprehensive MRSA colonization surveillance program. It is not intended to diagnose MRSA infection nor to guide or monitor treatment for MRSA infections. RESULT CALLED TO, READ BACK BY AND VERIFIED WITH: Gavin Potters, K. RN $Rem'@1028'KUDw$  ON 10.5.2021 BY [facility] Performed at [facility], [place] [address] [place], [place] [address]     Culture, blood (routine x 2)     Status: None (Preliminary result)   Collection Time: 05/21/20  9:42 AM   Specimen: BLOOD  Result Value Ref Range Status   Specimen Description   Final    BLOOD RIGHT ARM Performed at [facility] [address] [place], [place] [address]    Special Requests   Final    BOTTLES DRAWN AEROBIC ONLY Blood Culture results may not be optimal due to an inadequate volume of blood received in culture bottles Performed at [facility] [address] [place], [place] [address]    Culture   Final    NO GROWTH 2 DAYS Performed at [facility] [address], [place], [place] [address]    Report Status PENDING  Incomplete  Culture, Urine     Status: None   Collection Time: 05/21/20  9:45 AM   Specimen: Urine, Clean Catch  Result Value Ref Range Status   Specimen Description   Final    URINE, CLEAN CATCH Performed at [facility], [place] [address]  [place], [place] [address]    Special Requests   Final    NONE Performed at [facility], [place] [address] [place], [place] [address]    Culture   Final    NO GROWTH Performed at [facility], [place] [address], [place], [place] [address]    Report Status 05/22/2020 FINAL  Final  Culture, blood (routine x 2)     Status: None (Preliminary result)   Collection Time: 05/21/20  9:58 AM   Specimen: BLOOD LEFT HAND  Result Value Ref Range Status   Specimen Description   Final    BLOOD LEFT HAND Performed at [facility] [address], [place], [place] [address]    Special Requests   Final    BOTTLES DRAWN AEROBIC ONLY Blood Culture adequate volume Performed at [facility] [address] [place], [place] [address]    Culture   Final    NO GROWTH 2 DAYS Performed at [facility] [address], [place], [place] [address]    Report Status PENDING  Incomplete     Labs: Basic  Metabolic Panel: Recent Labs  Lab 05/18/20 0304 05/19/20 0245 05/20/20 0744 05/21/20 0314 05/22/20 0310 05/23/20 0240 05/24/20 0454  NA 139   < > 141 142 143 138 138  K 3.4*   < > 4.0 3.7 3.5 3.5 3.2*  CL 102   < > 109 104 102 101 98  CO2 25   < > 21* $Rem'27 28 29 28  'zsYB$ GLUCOSE 183*   < > 155* 124* 161* 166* 172*  BUN 28*   < > $R'19 19 17 16 14  'Me$ CREATININE 1.62*   < > 1.40* 1.47* 1.51* 1.51* 1.29*  CALCIUM 7.8*   < > 8.1* 8.3* 8.2* 8.1* 8.3*  MG 2.0  --   --  1.8 2.1  --  1.8  PHOS 2.4*  --   --   --   --   --   --    < > = values in this interval not displayed.   Liver Function Tests: Recent Labs  Lab 05/19/20 0245 05/20/20 0744 05/21/20 0314 05/22/20 0310 05/23/20 0240  AST 10* 13* 12* 15 14*  ALT $Re'11 11 12 13 12  'nlU$ ALKPHOS 39 41 39 45 50  BILITOT 0.6 0.8 1.1 0.7 0.9  PROT 6.5 6.8 6.8 7.0 7.1  ALBUMIN 2.3* 2.4* 2.5* 2.6* 2.8*   No results for input(s): LIPASE, AMYLASE in the last 168 hours. No results for input(s): AMMONIA in the last 168 hours. CBC: Recent Labs  Lab 05/18/20 0304 05/19/20 0245 05/20/20 0744 05/21/20 0314 05/22/20 0310 05/23/20 0240 05/24/20 0454  WBC 17.4*   < > 14.3* 13.5* 15.8* 15.3* 16.2*  NEUTROABS 12.4*  --   --   --   --   --   --   HGB 6.6*   < > 9.2* 8.0* 8.7* 9.0* 9.3*  HCT 21.3*   < > 27.8* 26.1* 27.8* 28.5* 29.6*  MCV 88.8   < > 87.4 92.9 91.4 91.9 91.6  PLT 259   < > 355 287 396 431* 456*   < > = values in this interval not displayed.   Cardiac Enzymes: No results for input(s): CKTOTAL, CKMB, CKMBINDEX, TROPONINI in the last 168 hours. BNP: BNP (last 3 results) Recent Labs    09/26/19 1116 12/31/19 1202 01/01/20 1846  BNP 49.4 1,407.8* 497.6*    ProBNP (last 3 results) No  results for input(s): PROBNP in the last 8760 hours.  CBG: Recent Labs  Lab 05/23/20 0738 05/23/20 1146 05/23/20 1652 05/23/20 2022 05/24/20 0740  GLUCAP 159* 240* 198* 160* 155*       Signed:  Florencia Reasons MD, PhD, FACP  [facility] 05/24/2020, 12:05 PM

## 2020-05-24 NOTE — Progress Notes (Signed)
  Subjective: Patient has been transferred to the floor.  He wants to go home.  Perinephric drain removed by IR yesterday.   Objective: Vital signs in last 24 hours: Temp:  [98 F (36.7 C)-99.2 F (37.3 C)] 99.2 F (37.3 C) (10/14 0535) Pulse Rate:  [64-72] 64 (10/14 0535) Resp:  [15-16] 15 (10/14 0535) BP: (112-139)/(61-77) 130/68 (10/14 0535) SpO2:  [92 %-98 %] 98 % (10/14 0535) Weight:  [100.7 kg] 100.7 kg (10/14 0535)  Intake/Output from previous day: 10/13 0701 - 10/14 0700 In: 660 [P.O.:660] Out: 1600 [Urine:1600] Intake/Output this shift: Total I/O In: -  Out: 200 [Urine:200]  Physical Exam:  Patient in bathroom  Lab Results: Recent Labs    05/22/20 0310 05/23/20 0240 05/24/20 0454  HGB 8.7* 9.0* 9.3*  HCT 27.8* 28.5* 29.6*   BMET Recent Labs    05/23/20 0240 05/24/20 0454  NA 138 138  K 3.5 3.2*  CL 101 98  CO2 29 28  GLUCOSE 166* 172*  BUN 16 14  CREATININE 1.51* 1.29*  CALCIUM 8.1* 8.3*     Studies/Results: No results found.  Assessment/Plan: Right perinephric abscess/subcapsular hematoma: Hemoglobin stable and repeat CT unchanged; drain removed, repeat scan as outpt in 2 weeks Left 4 mm proximal ureteral calculus: Will follow up as outpt 3.  Urethral stricture: now that patient is more ambulatory and alert can have void trial     LOS: 11 days   Bryann Mcnealy D Jamera Vanloan 05/24/2020, 9:49 AM

## 2020-05-25 ENCOUNTER — Telehealth: Payer: Self-pay

## 2020-05-25 LAB — HEMOGLOBIN A1C
Hgb A1c MFr Bld: 7.3 % — ABNORMAL HIGH (ref 4.8–5.6)
Mean Plasma Glucose: 163 mg/dL

## 2020-05-25 NOTE — Telephone Encounter (Signed)
Transition Care Management Follow-up Telephone Call   Date of discharge and from where:[facility] How have you been since you were released from the hospital? Better  Any questions or concerns? No questions/concerns reported.  Items Reviewed: Did the pt receive and understand the discharge instructions provided? Stated that have the instructions and have no questions.  Medications obtained and verified? Not yet will be mailed.  have no questions.  Any new allergies since your discharge? None reported  Do you have support at home? Yes, spouse Other (ie: DME, Home Health, etc)       Functional Questionnaire: (I = Independent and D = Dependent) ADL's:  Independent.      Follow up appointments reviewed:    PCP Hospital f/u appt confirmed? 11/012021 with Dr Cindee Salt. Informed they would receive notification if the appointment is in person or televisit.  [facility] f/u appt confirmed? None scheduled at this time   Are transportation arrangements needed? Per wife,no. They have transportation    If their condition worsens, is the pt aware to call  their PCP or go to the ED? Yes.Made pt aware if condition worsen or start experiencing rapid weight gain, chest pain, diff breathing, SOB, high fevers, or bleading to refer imediately to ED for further evaluation.   Was the patient provided with contact information for the PCP's office or ED? He has the phone number   Was the pt encouraged to call back with questions or concerns?yes

## 2020-05-26 LAB — CULTURE, BLOOD (ROUTINE X 2)
Culture: NO GROWTH
Culture: NO GROWTH
Special Requests: ADEQUATE

## 2020-05-28 ENCOUNTER — Other Ambulatory Visit (HOSPITAL_COMMUNITY): Payer: Self-pay | Admitting: Cardiology

## 2020-05-28 ENCOUNTER — Telehealth: Payer: Self-pay

## 2020-05-28 ENCOUNTER — Other Ambulatory Visit (HOSPITAL_COMMUNITY): Payer: Self-pay | Admitting: Internal Medicine

## 2020-05-28 ENCOUNTER — Other Ambulatory Visit: Payer: Self-pay | Admitting: Internal Medicine

## 2020-05-28 MED FILL — FUROSEMIDE 80 MG TAB: 80 | 30 days supply | Qty: 60 | Fill #3

## 2020-05-28 MED FILL — FAMOTIDINE 20 MG TABLET: 20 | 30 days supply | Qty: 60 | Fill #1

## 2020-05-28 MED FILL — RAMELTEON 8 MG TABS: 8 | 30 days supply | Qty: 30 | Fill #1

## 2020-05-28 MED FILL — predniSONE 5 MG TABS: 5 | 30 days supply | Qty: 30 | Fill #0

## 2020-05-28 MED FILL — PREGABALIN 100 MG CAPS: 100 | 30 days supply | Qty: 90 | Fill #1

## 2020-05-28 MED FILL — ALLOPURINOL 100 MG TABS: 100 | 30 days supply | Qty: 60 | Fill #0

## 2020-05-28 NOTE — Telephone Encounter (Signed)
[facility] called to request orders for Motorized wheelchair and Blood Pressure meter.  Please advice [phone] when orders have been sent.

## 2020-05-29 ENCOUNTER — Other Ambulatory Visit (HOSPITAL_COMMUNITY): Payer: Self-pay | Admitting: Internal Medicine

## 2020-05-29 MED FILL — CARVEDILOL 12.5 MG TABLET: 12.5 | 90 days supply | Qty: 180 | Fill #0

## 2020-05-29 NOTE — Telephone Encounter (Signed)
Will forward to pcp

## 2020-05-30 ENCOUNTER — Ambulatory Visit: Payer: Medicare Other | Admitting: Endocrinology

## 2020-05-30 NOTE — Telephone Encounter (Unsigned)
Copied from [place] [phone]. Topic: General - Other >> May 08, 2020  4:21 PM Leward Quan A wrote: Reason for CRM: Patient called to request a letter from Dr Wynetta Emery stating that he need to have an assistant driving with him because he is not capable of going places by himself. Please call patient for further information at Ph# [phone]

## 2020-05-31 ENCOUNTER — Telehealth: Payer: Self-pay | Admitting: Internal Medicine

## 2020-05-31 NOTE — Telephone Encounter (Signed)
Copied from [place] [phone]. Topic: General - Other >> May 08, 2020  4:21 PM Leward Quan A wrote: Reason for CRM: Patient called to request a letter from Dr Wynetta Emery stating that he need to have an assistant driving with him because he is not capable of going places by himself. Please call patient for further information at Ph# [phone] >> May 31, 2020  8:33 AM Keene Breath wrote: Patient is calling again to check the status of a letter he is requesting for his transportation.  He has been calling several times and still has not heard a response.  Please advise and call patient to update him.  CB# [phone] or [phone] >> May 30, 2020  9:39 AM Keene Breath wrote: Called to check the status of a letter that he needs for transportation to the doctor's office.  Please call patient asap to let him know how he can get the letter from the doctor.  CB# [phone]

## 2020-05-31 NOTE — Telephone Encounter (Signed)
Will forward to pcp

## 2020-05-31 NOTE — Telephone Encounter (Signed)
Rx for blood pressure device has been sent to pt preference pharmacy

## 2020-05-31 NOTE — Telephone Encounter (Signed)
Patient requesting to speak with practice admin regarding the below and would like a call back today [phone]

## 2020-06-01 ENCOUNTER — Telehealth: Payer: Self-pay

## 2020-06-01 NOTE — Progress Notes (Signed)
Letter was originally generated by PCP on 05/31/20 for pt pick up, copied and sent via MyChart for to access in case he is unable to pick up due to transportation issues.

## 2020-06-01 NOTE — Telephone Encounter (Signed)
Letter printed and placed at front for pick-up, also sent copy via MyChart in case pt cannot pick up due to transportation issues, called pt to inform, no answer, LM informing copy at front desk and available in [place], per DPR okay to leave detailed messages.

## 2020-06-01 NOTE — Telephone Encounter (Signed)
Follow-up for 05/28/20 encounter where pt called and stated that his insurance company was requesting scripts for power wheelchair, [facility] did not have request on file when I reached out for fax info, attempted to contact pt for further details/clarification on where script needs to be sent, no answer, LM to RC.

## 2020-06-01 NOTE — Telephone Encounter (Signed)
Will forward to pcp

## 2020-06-01 NOTE — Telephone Encounter (Signed)
Patient is returning a  Call to nurse Mateo Flow regarding a letter that he needed.  He stated that the letter is incorrect as far as needing a driver.  Patient stated that his medicaid provides transportation, but he needs someone to accompany him to the appt., not to drive.  Please advise and correct the letter and confirm with patient.   CB# [phone]

## 2020-06-01 NOTE — Telephone Encounter (Deleted)
LM informing pt letter available

## 2020-06-04 ENCOUNTER — Telehealth: Payer: Self-pay

## 2020-06-04 NOTE — Telephone Encounter (Signed)
Letter generated by provider printed for pt to pick-up

## 2020-06-04 NOTE — Progress Notes (Signed)
Letter generated by PCP sent via MyChart and printed for pt to pick-up if needed.

## 2020-06-04 NOTE — Telephone Encounter (Signed)
Called pt to inform updated letter printed and placed at front desk for pick-up and sent via [place], LM on VM in detail per [facility]

## 2020-06-04 NOTE — Telephone Encounter (Signed)
Letter is ready for pickup at the front desk

## 2020-06-06 ENCOUNTER — Ambulatory Visit (INDEPENDENT_AMBULATORY_CARE_PROVIDER_SITE_OTHER): Payer: Medicare Other | Admitting: Endocrinology

## 2020-06-06 ENCOUNTER — Encounter: Payer: Self-pay | Admitting: Endocrinology

## 2020-06-06 ENCOUNTER — Other Ambulatory Visit: Payer: Self-pay | Admitting: Endocrinology

## 2020-06-06 ENCOUNTER — Other Ambulatory Visit: Payer: Self-pay

## 2020-06-06 VITALS — BP 128/78 | HR 74 | Ht 66.0 in | Wt 222.1 lb

## 2020-06-06 DIAGNOSIS — E11649 Type 2 diabetes mellitus with hypoglycemia without coma: Secondary | ICD-10-CM

## 2020-06-06 DIAGNOSIS — N183 Chronic kidney disease, stage 3 unspecified: Secondary | ICD-10-CM

## 2020-06-06 DIAGNOSIS — Z794 Long term (current) use of insulin: Secondary | ICD-10-CM

## 2020-06-06 DIAGNOSIS — E1122 Type 2 diabetes mellitus with diabetic chronic kidney disease: Secondary | ICD-10-CM | POA: Diagnosis not present

## 2020-06-06 MED ORDER — INSULIN LISPRO PROT & LISPRO (75-25 MIX) 100 UNIT/ML KWIKPEN: 60.0000 [IU] | PEN_INJECTOR | Freq: Every day | SUBCUTANEOUS | Status: DC

## 2020-06-06 MED FILL — UNIFINE PENTIPS 32GX5/32: 32G X 4 MM | 90 days supply | Qty: 100 | Fill #0

## 2020-06-06 NOTE — Patient Instructions (Addendum)
check your blood sugar twice a day.  vary the time of day when you check, between before the 3 meals, and at bedtime.  also check if you have symptoms of your blood sugar being too high or too low.  please keep a record of the readings and bring it to your next appointment here (or you can bring the meter itself).  You can write it on any piece of paper.  please call us sooner if your blood sugar goes below 70, or if you have a lot of readings over 200.   Please reduce the insulin to 60 units with breakfast.   With time, your insulin need will increase back to what it was before you got sick.   On this type of insulin schedule, you should eat meals on a regular schedule (especially lunch).  If a meal is missed or significantly delayed, your blood sugar could go low.    Please come back for a follow-up appointment in 6 weeks.

## 2020-06-06 NOTE — Progress Notes (Signed)
Subjective:    Patient ID: Marco [name], male    DOB: 1956/03/03, 64 y.o.   MRN: [id number]  HPI Pt returns for f/u of diabetes mellitus: DM type: Insulin-requiring type 2 Dx'ed: 4970 Complications: polyneuropathy, CAD, NPDR, and stage 3a CRI  Therapy: insulin since soon after dx DKA: never Severe hypoglycemia: once (2020) Pancreatitis: never.  Pancreatic imaging: never.  Other: he chronically takes prednisone, for psoriasis, with no plan for taper; he declined to continue multiple daily injections; due to renal failure (and fasting hypoglycemia), he takes 75/25 insulin.   Interval history: wife brings his meter with his cbg's which I have reviewed today.  cbg's vary from 37-256.  There is no trend throughout the day.  pt says he never misses the insulin.  pt states he feels better in general, since recent hosp stay.  75/25 insulin was reduced to 100 units qam.  Due to hypoglycemia, wife reduced to 80 units qam, but he continues to have daily hypoglycemia. He was rx'ed PRN humalog, but could not afford to buy it.  No change in chronic prednisone dosage (5 mg qd).   Past Medical History:  Diagnosis Date  . Benign colon polyp 08/01/2013   [facility] in [place]. large base tranverse colon polyp was biopsied. polyp was benign with minimal surface hyperplastic change.  . Chronic combined systolic and diastolic CHF (congestive heart failure) ([place])   . Chronic pain   . CKD (chronic kidney disease), stage III ([place])   . Diabetes mellitus without complication ([place])   . Diverticulosis   . Esophageal hiatal hernia 07/29/2013   confirmed on EGD   . Esophageal stricture   . Essential hypertension   . Gastritis 07/29/2013   confirmed on EGD, bx done an negative for intestinal metaplasia, dsyplasia or H. pylori. normal gastric emptying study done 07/13/2013.  [name] GERD (gastroesophageal reflux disease)   . Morbid obesity ([place])   . Non-obstructive CAD    a. 02/2013 Cath ([place]): nonobs  dzs;  b. 09/2014 Myoview ([place]): EF 55%, no ischemia;  c. Cath 05/2018 mild CAD no obstruction.  . Persistent atrial fibrillation ([place])    a. 02/2013 s/p rfca in [place], [place]-->prev on Xarelto, d/c'd 2/2 anemia, ? GIB.  [name] Rheumatoid arthritis ([place])   . Sigmoid diverticulosis 08/01/2013   confirmed on colonscopy. record scanned into chart    Past Surgical History:  Procedure Laterality Date  . CARDIAC CATHETERIZATION  05/2014   ablation for atrial fibrillation  . CARDIAC CATHETERIZATION N/A 11/16/2015   Procedure: Left Heart Cath and Coronary Angiography;  Surgeon: Leonie Man, MD;  Location: [facility];  Service: Cardiovascular;  Laterality: N/A;  . COLON RESECTION  09/2013   due to large, abnormal polpy. non cancerous per patient.   . COLON SURGERY  09/2014   colon resection   . IR PERC PLEURAL DRAIN W/INDWELL CATH W/IMG GUIDE  01/07/2020  . LEFT HEART CATH AND CORONARY ANGIOGRAPHY N/A 05/19/2018   Procedure: LEFT HEART CATH AND CORONARY ANGIOGRAPHY;  Surgeon: Wellington Hampshire, MD;  Location: [facility];  Service: Cardiovascular;  Laterality: N/A;    Social History   Socioeconomic History  . Marital status: Married    Spouse name: Amethyst  . Number of children: 8  . Years of education: 58  . Highest education level: Some college, no degree  Occupational History    Comment: disabled  Tobacco Use  . Smoking status: Former Smoker  Packs/day: 0.50    Years: 10.00    Pack years: 5.00    Types: Cigarettes    Quit date: 04/11/2008    Years since quitting: 12.1  . Smokeless tobacco: Never Used  Vaping Use  . Vaping Use: Never used  Substance and Sexual Activity  . Alcohol use: No    Alcohol/week: 0.0 standard drinks  . Drug use: No  . Sexual activity: Yes    Partners: Female    Birth control/protection: None  Other Topics Concern  . Not on file  Social History Narrative   Lives with wife. Does not work.  On disability.    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 Provided crisis assistance program information to assist with bills       Has issues getting food- gets $36/month in food stamps- already has list of food pantries but has not tried them- encouraged pt to try food pantries and reach out to clinic for help if needed   Social Determinants of Health   Financial Resource Strain:   . Difficulty of Paying Living Expenses: Not on file  Food Insecurity:   . Worried About Charity fundraiser in the Last Year: Not on file  . Ran Out of Food in the Last Year: Not on file  Transportation Needs:   . Lack of Transportation (Medical): Not on file  . Lack of Transportation (Non-Medical): Not on file  Physical Activity:   . Days of Exercise per Week: Not on file  . Minutes of Exercise per Session: Not on file  Stress:   . Feeling of Stress : Not on file  Social Connections:   . Frequency of Communication with Friends and Family: Not on file  . Frequency of Social Gatherings with Friends and Family: Not on file  . Attends Religious Services: Not on file  . Active Member of Clubs or Organizations: Not on file  . Attends Archivist Meetings: Not on file  . Marital Status: Not on file  Intimate Partner Violence:   . Fear of Current or Ex-Partner: Not on file  . Emotionally Abused: Not on file  . Physically Abused: Not on file  . Sexually Abused: Not on file    Current Outpatient Medications on File Prior to Visit  Medication Sig Dispense Refill  . Accu-Chek FastClix Lancets MISC 1 each by Other route 2 (two) times daily. E11.42 204 each 0  . ACCU-CHEK GUIDE test strip USE 1 STRIP TWICE DAILY TO CHECK BLOOD GLUCOSE 100 strip 7  . albuterol (PROVENTIL HFA;VENTOLIN HFA) 108 (90 Base) MCG/ACT inhaler Inhale 2 puffs into the lungs every 6 (six) hours as  needed for wheezing or shortness of breath. 1 Inhaler 0  . allopurinol (ZYLOPRIM) 100 MG tablet Take 200 mg by mouth every evening.     [name] aspirin (EQ ASPIRIN ADULT LOW DOSE) 81 MG EC tablet Take 1 tablet (81 mg total) by mouth daily. TAKE 1 TABLET BY MOUTH ONCE DAILY. SWALLOW WHOLE Please hold asa until you repeat blood work, resume if blood count stable and ok with you primary care doctor    . atorvastatin (LIPITOR) 40 MG tablet Take 1 tablet (40 mg total) by mouth daily. 90 tablet 2  . carvedilol (COREG) 12.5 MG tablet TAKE  1 TABLET BY MOUTH TWICE DAILY WITH A MEAL 180 tablet 1  . cyclobenzaprine (FLEXERIL) 5 MG tablet Take 5 mg by mouth every 12 (twelve) hours.    . diclofenac Sodium (VOLTAREN) 1 % GEL Apply 4 g topically 4 (four) times daily. (Patient taking differently: Apply 4 g topically 4 (four) times daily as needed (pain). ) 100 g 0  . DULoxetine (CYMBALTA) 20 MG capsule TAKE 1 CAPSULE BY MOUTH ONCE DAILY (Patient taking differently: Take 20 mg by mouth daily. ) 90 capsule 0  . famotidine (PEPCID) 20 MG tablet TAKE 1 TABLET BY MOUTH TWO TIMES DAILY (Patient taking differently: Take 20 mg by mouth 2 (two) times daily. ) 60 tablet 2  . ferrous sulfate 325 (65 FE) MG tablet Take 1 tablet (325 mg total) by mouth daily with breakfast. 60 tablet 0  . furosemide (LASIX) 80 MG tablet Take 80 mg by mouth daily.     . isosorbide mononitrate (IMDUR) 30 MG 24 hr tablet Take 30 mg by mouth daily.    . naloxone (NARCAN) nasal spray 4 mg/0.1 mL Place in nostril in the event of suspected overdose on Narcotic medication (Oxycodone) 1 each 0  . oxyCODONE (OXY IR/ROXICODONE) 5 MG immediate release tablet Take 5 mg by mouth 3 (three) times daily as needed for moderate pain.     . polyethylene glycol (MIRALAX / GLYCOLAX) 17 g packet Take 17 g by mouth daily as needed for moderate constipation or severe constipation. 14 each 0  . Potassium Chloride ER 20 MEQ TBCR Take 20 mEq by mouth daily.     . predniSONE  (DELTASONE) 5 MG tablet Take 5 mg by mouth daily.    . pregabalin (LYRICA) 100 MG capsule Take 100 mg by mouth 3 (three) times daily.     . ramelteon (ROZEREM) 8 MG tablet TAKE 1 TABLET BY MOUTH AT BEDTIME (Patient taking differently: Take 8 mg by mouth at bedtime. ) 30 tablet 1  . sacubitril-valsartan (ENTRESTO) 24-26 MG Take 1 tablet by mouth 2 (two) times daily. 60 tablet 4  . Secukinumab (COSENTYX) 150 MG/ML SOSY Inject 150 mg into the skin every 28 (twenty-eight) days. Hold until told to resume by your rheumatologist 1.96 mL   . Tafamidis (VYNDAMAX) 61 MG CAPS Take 61 mg by mouth daily. 30 capsule 11   No current facility-administered medications on file prior to visit.    Allergies  Allergen Reactions  . Nsaids Other (See Comments)    Stomach pains. Ulcers - stated by patient   . Jardiance [Empagliflozin] Nausea And Vomiting    Family History  Problem Relation Age of Onset  . Hypertension Mother   . Diabetes Mother   . COPD Mother   . Lung cancer Mother        Smoker   . Hypertension Father   . Diabetes Father   . Heart Problems Father   . COPD Father   . Colon cancer Neg Hx   . Stomach cancer Neg Hx     BP 128/78   Pulse 74   Ht 5\' 6"  (1.676 m)   Wt 222 lb 1.6 oz (100.7 kg)   SpO2 90%   BMI 35.85 kg/m   Review of Systems Denies LOC    Objective:   Physical Exam VITAL SIGNS:  See vs page GENERAL: no distress.  In wheelchair Pulses: dorsalis pedis intact bilat.   MSK: no deformity of the feet CV: no leg edema Skin:  no ulcer on  the feet.  normal color and temp on the feet. Neuro: sensation is intact to touch on the feet  Lab Results  Component Value Date   HGBA1C 7.3 (H) 05/24/2020   Lab Results  Component Value Date   CREATININE 1.29 (H) 05/24/2020   BUN 14 05/24/2020   NA 138 05/24/2020   K 3.2 (L) 05/24/2020   CL 98 05/24/2020   CO2 28 05/24/2020       Assessment & Plan:  Insulin-requiring type 2 DM, with stage 3a CRI. Hypoglycemia, due to  insulin: this limits aggressiveness of glycemic control   Patient Instructions  check your blood sugar twice a day.  vary the time of day when you check, between before the 3 meals, and at bedtime.  also check if you have symptoms of your blood sugar being too high or too low.  please keep a record of the readings and bring it to your next appointment here (or you can bring the meter itself).  You can write it on any piece of paper.  please call us sooner if your blood sugar goes below 70, or if you have a lot of readings over 200.   Please reduce the insulin to 60 units with breakfast.   With time, your insulin need will increase back to what it was before you got sick.   On this type of insulin schedule, you should eat meals on a regular schedule (especially lunch).  If a meal is missed or significantly delayed, your blood sugar could go low.    Please come back for a follow-up appointment in 6 weeks.

## 2020-06-07 ENCOUNTER — Ambulatory Visit: Payer: Medicare Other | Admitting: Internal Medicine

## 2020-06-07 NOTE — Progress Notes (Deleted)
HPI male former smoker followed for OSA/ Insomnia, dyspnea on exertion, complicated by chronic pain, anxiety, DM 2, history cardiac ablation, gout NPSG 37.8/hour on 06/14/2015, desaturation to 76%, body weight 223 pounds BiPAP titration 08/19/2015- to 20/16 Barium Swallow 02/07/2016-normal CXR 01/22/2016-NAD PFT 02/22/2016-minimal restriction, mild diffusion deficit. FVC 2.62/76%, FEV1 2.14/81%, ratio 0.82, DLCO 61%, TLC 79%, RV/TLC 135%. 6MWT- 02/22/16-97%, 98%, 98%. Stopped after only 4 m complaining of chest tightness, headache, shortness of breath, dizziness. BP and heart rate were unremarkable. Unattended Home Sleep Test-05/25/2016-AHI 36.9/hour, desaturation to 82%, body weight 212 pounds   ----------------------------------------------------------------------------   02/02/20- 64 year old male former smoker followed for OSA/ Insomnia, dyspnea on exertion, complicated by chronic pain, anxiety, DM 2/retinopathy, history cardiac ablation, gout/arthritis, CHF/ CM, CKD, HBP, GERD, psoriasis, gout, Obesity,  VPAP auto 14/ 11, PS2/ [facility] 5/18-01/14/20 for pyelonephritis with sepsis Ambien 6.25 CR, Albuterol hfa, Download compliance 53%( was in hosp), AHI 6.8/ hr Body weight today- 229 lbs Say s pressure doesn't "feel right". Has f/u w [facility] about pleural effusion. Covax- 2 Phizer CXR 01/12/20- 1V-  IMPRESSION: 1. Persistent right lung base opacity and small right pleural effusion, unchanged from the prior chest radiograph. 2. Resolved left basilar atelectasis.  No new lung abnormalities. 3. Stable mild cardiomegaly.  06/07/20- 64 year old male former smoker followed for OSA/ Insomnia, dyspnea on exertion, complicated by chronic pain, anxiety, DM 2/retinopathy, AFib/ history cardiac ablation, gout/arthritis, CHF/ CM, CKD3, HBP, GERD, psoriasis, gout, Obesity, Rheumatoid Arthritis,  VPAP auto 14/ 11, PS2/ [facility] Rozerem 8 mg, albuterol hfa, prednisone 5 mg daily,  Download- Body weight  today- Covid vax- 2 Phizer Flu vax- Hosp 10/2-10/14   R renal abscess, sepsis  CXR 05/31/20-  IMPRESSION: Atelectatic change right minor fissure region. Lungs otherwise clear. Stable cardiac silhouette. Calcification noted in each carotid artery.   ROS-see HPI    "+" = positive Constitutional:    weight loss, night sweats, fevers, chills, fatigue, lassitude. HEENT:    headaches, + difficulty swallowing, tooth/dental problems, sore throat,       sneezing, itching, ear ache, nasal congestion, post nasal drip, snoring CV:     chest pain, orthopnea, PND, + swelling in lower extremities, anasarca,                                                          dizziness, palpitations Resp:  + shortness of breath with exertion or at rest.                productive cough,   non-productive cough, coughing up of blood.              change in color of mucus.  wheezing.   Skin:    rash or lesions. GI:  No-   heartburn, indigestion, + abdominal pain, nausea, vomiting, diarrhea,                 change in bowel habits, loss of appetite GU: dysuria, change in color of urine, no urgency or frequency.   flank pain. MS:   + joint pain, stiffness, decreased range of motion, back pain. Neuro-     nothing unusual Psych:  change in mood or affect.  depression or anxiety.   memory loss.  OBJ- Physical Exam General- Alert, Oriented, Affect-appropriate, Distress- none acute, + obese, +  walker Skin- rash-none, lesions- none, excoriation- none Lymphadenopathy- none Head- atraumatic            Eyes- Gross vision intact, PERRLA, conjunctivae and secretions clear            Ears- Hearing, canals-normal            Nose- Clear, no-Septal dev, mucus, polyps, erosion, perforation             Throat- Mallampati IV , mucosa clear-not particularly dry , drainage- none, tonsils- atrophic,                  +many missing teeth Neck- flexible , trachea midline, no stridor , thyroid nl, carotid no bruit Chest - symmetrical  excursion , unlabored           Heart/CV- RRR , no murmur , no gallop  , no rub, nl s1 s2                           - JVD- none , edema- none, stasis changes- none, varices- none           Lung- clear to P&A, wheeze- none, cough- none , dullness-none, rub- none           Chest wall-  Abd-  Br/ Gen/ Rectal- Not done, not indicated Extrem- cyanosis- none, clubbing, none, atrophy- none, strength- nl Neuro- grossly intact to observation

## 2020-06-11 ENCOUNTER — Ambulatory Visit: Payer: Medicare Other | Attending: Internal Medicine | Admitting: Internal Medicine

## 2020-06-11 ENCOUNTER — Encounter: Payer: Self-pay | Admitting: Internal Medicine

## 2020-06-11 ENCOUNTER — Other Ambulatory Visit: Payer: Self-pay

## 2020-06-11 VITALS — BP 110/69 | HR 62 | Temp 98.0°F | Resp 16 | Wt 221.0 lb

## 2020-06-11 DIAGNOSIS — I5042 Chronic combined systolic (congestive) and diastolic (congestive) heart failure: Secondary | ICD-10-CM

## 2020-06-11 DIAGNOSIS — R269 Unspecified abnormalities of gait and mobility: Secondary | ICD-10-CM

## 2020-06-11 DIAGNOSIS — Z794 Long term (current) use of insulin: Secondary | ICD-10-CM | POA: Diagnosis not present

## 2020-06-11 DIAGNOSIS — E11649 Type 2 diabetes mellitus with hypoglycemia without coma: Secondary | ICD-10-CM

## 2020-06-11 DIAGNOSIS — N151 Renal and perinephric abscess: Secondary | ICD-10-CM | POA: Diagnosis not present

## 2020-06-11 DIAGNOSIS — Z09 Encounter for follow-up examination after completed treatment for conditions other than malignant neoplasm: Secondary | ICD-10-CM

## 2020-06-11 DIAGNOSIS — D649 Anemia, unspecified: Secondary | ICD-10-CM

## 2020-06-11 DIAGNOSIS — Z9181 History of falling: Secondary | ICD-10-CM

## 2020-06-11 DIAGNOSIS — K661 Hemoperitoneum: Secondary | ICD-10-CM

## 2020-06-11 DIAGNOSIS — Z2821 Immunization not carried out because of patient refusal: Secondary | ICD-10-CM

## 2020-06-11 DIAGNOSIS — R410 Disorientation, unspecified: Secondary | ICD-10-CM | POA: Diagnosis not present

## 2020-06-11 LAB — GLUCOSE, POCT (MANUAL RESULT ENTRY)
POC Glucose: 102 mg/dl — AB (ref 70–99)
POC Glucose: 85 mg/dl (ref 70–99)

## 2020-06-11 MED FILL — VYNDAMAX 61 MG CAPS: 61 | 30 days supply | Qty: 30 | Fill #8

## 2020-06-11 NOTE — Patient Instructions (Signed)
STOP Cyclobenzaprine for now. Please schedule a follow up appointment with your rheumatologist.

## 2020-06-11 NOTE — Progress Notes (Signed)
Patient ID: Marco [name], male    DOB: [birth year]  MRN: [id number]  CC: Hospitalization Follow-up   Subjective: Marco [name] is a 64 y.o. male who presents for hosp f/u.  Wife is with him His concerns today include:  history of perinephric abscess (12/2019 s/p drainage - E.coli), HTN,DM2 with neuropathy, and nephropathy, CKD stage III, obesity, nonobstructive CAD/cardiac amyloid, chronic combined CHFEF 55-60% in May of this yr and 25-30% 05/2020,PAF s/p ablation 2017 (Xarelto d/c due to recurrent anemia),OSA on VPAP, on Cosentyx and followed by rheumatology in [facility] for psoriasis/gout/nonspec arthritis, chronic insomnia. On narcotics through [facility] for chronic pain inchest/back/shoulders/neck.  Pt hosp 10/2-14/2021 with severe sepsis secondary to perinephric abscess, metabolic encephalopathy and ARF superimposed on CKD 3.  CT of abdomen revealed:  1. Progression of RT renal abscess 2. New hypodense liver lesions, abscess vs mets 3. Mild LT hydro with small 4 mm obstructing stone -Initial urine, blood cultures positive for E. coli.  Patient treated with IV Rocephin.  Seen by urology.  Drain placed in the right kidney by IR.  It was discontinued 10 days later.  Repeat blood culture negative for bacterial growth. [facility] course complicated by retroperitoneal hematoma/acute blood loss anemia. Hb dropped to 6.6. Transfused 2 units PRBC.  Hb post transfusion stable around 9. -Patient with 4 mm left-sided ureteral stone.  Also found to have urethral stricture.  Foley catheter placed.  Voiding trial was successful prior to discharge. -Patient with AKI on CKD 3.  Creatinine peaked at 2.06.  By the time of discharge creatinine was 1.29. -Acute on chronic combined CHF: EF on echo had decreased to 25-30%.  Entresto held secondary to hypotension.  Jardiance held secondary to nausea and vomiting.  At the time of discharge [name] was restarted.  Discharge on Coreg 12.5 mg twice a day,  Lasix 80 mg daily and Imdur 30 mg daily. -Patient told to hold Cosentyx new prednisone 5 mg daily and follow-up with his rheumatologist.  Today: Perinephritic abscess: Patient had repeat CAT scan done last Monday 06/04/2020 at [facility] urology.  He was scheduled for an in person follow-up today but had to call and reschedule because he would have been late getting to that appointment from here.  No fever.  Passing his urine okay.  He notes that whenever he urinates his bowel moves at the same time.  Having to wear depends.  Complains of legs giving out intermittently.  [place] x2 this past week despite using his walker.  He had home P.T recent hospitalization.  Both he and his wife feel that he would benefit from some more home P.T.  The last therapist and his insurance had requested prescription from me for a mobility chair.  Wife states that they had called inquiring where he would like to get the chair from but they are not familiar with any particular company.  When he goes to appointments, his wife has to carry him in a wheelchair because he is unable to safely walk on his own. -Wife has noticed that he has had increased mental confusion since restarting Flexeril post hospitalization.  Reports that he has visual and auditory hallucinations at times.  Sleeps on and off during the day but last night he was awake the entire night.  Ambien changed to Rozerem by one of his specialists.  He is also on oxycodone, Lyrica, Cymbalta.  CHF/CAD: Patient has had lower extremity edema.  Wife has been giving the furosemide 80 mg twice a  day instead of once a day stating that this is what is on the bottle.  Denies any shortness of breath at rest.  SOB when he tries to do his ADL.  He is on Entresto, carvedilol, Lipitor but his wife does not recognize isosorbide.  Aspirin was supposed to be on hold until he saw me today for repeat CBC.  However wife states that he has been taking that since discharge from the hospital.   They did not bring his medicines with him today.  He is taking the iron supplement. -saw [name] 10.1.2021 prior to recent hosp  DM type II: Saw [name] recently.  Humalog 75/25 mix was decreased to 60 units daily.  He has his glucometer with him today.  Checking blood sugars 2-3 times a day.  Range has been 71-178.  Close Cosentyx is on hold post recent hosp.  Last saw rheumatology at [facility] 04/20/2020.  He is not sure when his next f/u is scheduled for.   Patient Active Problem List   Diagnosis Date Noted  . Perinephric abscess 05/13/2020  . Pyohydronephrosis 05/13/2020  . Vitamin B12 deficiency 02/19/2020  . Elevated LFTs   . Pleural effusion   . Weakness   . Loculated pleural effusion   . Complex renal cyst   . Urethral stricture 12/30/2019  . Nephrolithiasis 12/30/2019  . Acute febrile illness 12/30/2019  . Sepsis ([place]) 12/27/2019  . Severe sepsis ([place]) 12/10/2019  . Acute metabolic encephalopathy 07/37/1062  . Sepsis due to Gram negative bacteria ([place]) 12/09/2019  . Rhinitis 10/16/2019  . Gastroesophageal reflux disease without esophagitis 08/31/2018  . Muscle spasm 08/31/2018  . NICM (nonischemic cardiomyopathy) ([place]) 07/06/2018  . Hypokalemia   . Acute renal failure superimposed on stage 3 chronic kidney disease ([place]) 08/27/2017  . Hyperlipidemia 12/23/2016  . Chronic combined systolic and diastolic CHF (congestive heart failure) ([employer]) 09/09/2016  . Acute respiratory failure with hypoxia ([place]) 09/02/2016  . Diabetic polyneuropathy associated with type 2 diabetes mellitus ([place]) 06/10/2016  . Vision changes 06/02/2016  . Myalgia and myositis 03/31/2016  . Shortness of breath 02/09/2016  . Chronic gouty arthritis 11/29/2015  . LBP (low back pain) 11/27/2015  . Arthralgia of multiple joints 11/27/2015  . 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[place]) 11/27/2015  . Breast pain in male 11/14/2015  . Sinusitis, chronic 10/16/2015  . Insomnia 10/16/2015  . New onset of headaches  after age 45 09/20/2015  . OSA (obstructive sleep apnea) 07/12/2015  . Low serum testosterone level 05/18/2015  . Depression 05/16/2015  . Morbid obesity due to excess calories ([place])   . Chronic pain   . Type 2 diabetes mellitus with stage 3 chronic kidney disease, without long-term current use of insulin ([place])   . Rheumatoid arthritis ([place]) 02/05/2015  . Elevated troponin 12/24/2014  . CAD (coronary artery disease) 12/24/2014  . Atrial fibrillation ([place]) 12/24/2014  . Essential hypertension 12/24/2014  . Chronic kidney disease 12/24/2014  . PUD (peptic ulcer disease) 12/24/2014     Current Outpatient Medications on File Prior to Visit  Medication Sig Dispense Refill  . Accu-Chek FastClix Lancets MISC 1 each by Other route 2 (two) times daily. E11.42 204 each 0  . ACCU-CHEK GUIDE test strip USE 1 STRIP TWICE DAILY TO CHECK BLOOD GLUCOSE 100 strip 7  . albuterol (PROVENTIL HFA;VENTOLIN HFA) 108 (90 Base) MCG/ACT inhaler Inhale 2 puffs into the lungs every 6 (six) hours as needed for wheezing or shortness of breath. 1 Inhaler 0  . allopurinol (ZYLOPRIM) 100  MG tablet Take 200 mg by mouth every evening.     [name] aspirin (EQ ASPIRIN ADULT LOW DOSE) 81 MG EC tablet Take 1 tablet (81 mg total) by mouth daily. TAKE 1 TABLET BY MOUTH ONCE DAILY. SWALLOW WHOLE Please hold asa until you repeat blood work, resume if blood count stable and ok with you primary care doctor    . atorvastatin (LIPITOR) 40 MG tablet Take 1 tablet (40 mg total) by mouth daily. 90 tablet 2  . carvedilol (COREG) 12.5 MG tablet TAKE 1 TABLET BY MOUTH TWICE DAILY WITH A MEAL 180 tablet 1  . diclofenac Sodium (VOLTAREN) 1 % GEL Apply 4 g topically 4 (four) times daily. (Patient taking differently: Apply 4 g topically 4 (four) times daily as needed (pain). ) 100 g 0  . DULoxetine (CYMBALTA) 20 MG capsule TAKE 1 CAPSULE BY MOUTH ONCE DAILY (Patient taking differently: Take 20 mg by mouth daily. ) 90 capsule 0  . famotidine (PEPCID)  20 MG tablet TAKE 1 TABLET BY MOUTH TWO TIMES DAILY (Patient taking differently: Take 20 mg by mouth 2 (two) times daily. ) 60 tablet 2  . ferrous sulfate 325 (65 FE) MG tablet Take 1 tablet (325 mg total) by mouth daily with breakfast. 60 tablet 0  . furosemide (LASIX) 80 MG tablet Take 80 mg by mouth daily.     . Insulin Lispro Prot & Lispro (HUMALOG MIX 75/25 KWIKPEN) (75-25) 100 UNIT/ML Kwikpen Inject 60 Units into the skin daily with breakfast. Start with 30units daily, please check blood glucose three times a day and using sliding scale insulin if indicated per sliding scale, please see Dr Loanne Drilling in one week 15 mL   . isosorbide mononitrate (IMDUR) 30 MG 24 hr tablet Take 30 mg by mouth daily.    . naloxone (NARCAN) nasal spray 4 mg/0.1 mL Place in nostril in the event of suspected overdose on Narcotic medication (Oxycodone) 1 each 0  . oxyCODONE (OXY IR/ROXICODONE) 5 MG immediate release tablet Take 5 mg by mouth 3 (three) times daily as needed for moderate pain.     . polyethylene glycol (MIRALAX / GLYCOLAX) 17 g packet Take 17 g by mouth daily as needed for moderate constipation or severe constipation. 14 each 0  . Potassium Chloride ER 20 MEQ TBCR Take 20 mEq by mouth daily.     . predniSONE (DELTASONE) 5 MG tablet Take 5 mg by mouth daily.    . pregabalin (LYRICA) 100 MG capsule Take 100 mg by mouth 3 (three) times daily.     . ramelteon (ROZEREM) 8 MG tablet TAKE 1 TABLET BY MOUTH AT BEDTIME (Patient taking differently: Take 8 mg by mouth at bedtime. ) 30 tablet 1  . sacubitril-valsartan (ENTRESTO) 24-26 MG Take 1 tablet by mouth 2 (two) times daily. 60 tablet 4  . Secukinumab (COSENTYX) 150 MG/ML SOSY Inject 150 mg into the skin every 28 (twenty-eight) days. Hold until told to resume by your rheumatologist 1.96 mL   . Tafamidis (VYNDAMAX) 61 MG CAPS Take 61 mg by mouth daily. 30 capsule 11  . UNIFINE PENTIPS 32G X 4 MM MISC USE AS DIRECTED DAILY 100 each 0   No current  facility-administered medications on file prior to visit.    Allergies  Allergen Reactions  . Nsaids Other (See Comments)    Stomach pains. Ulcers - stated by patient   . Jardiance [Empagliflozin] Nausea And Vomiting    Social History   Socioeconomic History  . Marital  status: Married    Spouse name: Amethyst  . Number of children: 8  . Years of education: 44  . Highest education level: Some college, no degree  Occupational History    Comment: disabled  Tobacco Use  . Smoking status: Former Smoker    Packs/day: 0.50    Years: 10.00    Pack years: 5.00    Types: Cigarettes    Quit date: 04/11/2008    Years since quitting: 12.1  . Smokeless tobacco: Never Used  Vaping Use  . Vaping Use: Never used  Substance and Sexual Activity  . Alcohol use: No    Alcohol/week: 0.0 standard drinks  . Drug use: No  . Sexual activity: Yes    Partners: Female    Birth control/protection: None  Other Topics Concern  . Not on file  Social History Narrative   Lives with wife. Does not work.  On disability.    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 Provided crisis assistance program information to assist with bills       Has issues getting food- gets $36/month in food stamps- already has list of food pantries but has not tried them- encouraged pt to try food pantries and reach out to clinic for help if needed   Social Determinants of Health   Financial Resource Strain:   . Difficulty of Paying Living Expenses: Not on file  Food Insecurity:   . Worried About Charity fundraiser in the Last Year: Not on file  . Ran Out of Food in the Last Year: Not on file  Transportation Needs:   . Lack of Transportation (Medical): Not on file  . Lack of Transportation (Non-Medical): Not on file  Physical Activity:   .  Days of Exercise per Week: Not on file  . Minutes of Exercise per Session: Not on file  Stress:   . Feeling of Stress : Not on file  Social Connections:   . Frequency of Communication with Friends and Family: Not on file  . Frequency of Social Gatherings with Friends and Family: Not on file  . Attends Religious Services: Not on file  . Active Member of Clubs or Organizations: Not on file  . Attends Archivist Meetings: Not on file  . Marital Status: Not on file  Intimate Partner Violence:   . Fear of Current or Ex-Partner: Not on file  . Emotionally Abused: Not on file  . Physically Abused: Not on file  . Sexually Abused: Not on file    Family History  Problem Relation Age of Onset  . Hypertension Mother   . Diabetes Mother   . COPD Mother   . Lung cancer Mother        Smoker   . Hypertension Father   . Diabetes Father   . Heart Problems Father   . COPD Father   . Colon cancer Neg Hx   . Stomach cancer Neg Hx     Past Surgical History:  Procedure Laterality Date  . CARDIAC CATHETERIZATION  05/2014   ablation for atrial fibrillation  . CARDIAC CATHETERIZATION N/A 11/16/2015   Procedure: Left Heart Cath and Coronary Angiography;  Surgeon: Leonie Man, MD;  Location: [facility];  Service: Cardiovascular;  Laterality: N/A;  . COLON  RESECTION  09/2013   due to large, abnormal polpy. non cancerous per patient.   . COLON SURGERY  09/2014   colon resection   . IR PERC PLEURAL DRAIN W/INDWELL CATH W/IMG GUIDE  01/07/2020  . LEFT HEART CATH AND CORONARY ANGIOGRAPHY N/A 05/19/2018   Procedure: LEFT HEART CATH AND CORONARY ANGIOGRAPHY;  Surgeon: Wellington Hampshire, MD;  Location: [facility];  Service: Cardiovascular;  Laterality: N/A;    ROS: Review of Systems Negative except as stated above  PHYSICAL EXAM: BP 110/69   Pulse 62   Temp 98 F (36.7 C)   Resp 16   Wt 221 lb (100.2 kg)   SpO2 95%   BMI 35.67 kg/m   Wt Readings from Last 3  Encounters:  06/11/20 221 lb (100.2 kg)  06/06/20 222 lb 1.6 oz (100.7 kg)  05/24/20 223 lb 8.7 oz (101.4 kg)    Physical Exam   General appearance - pt in WC.  He appears chronically ill.  Some fine tremors of hands Mental status - oriented to person, place, day of wk, year, season but thinks the mth is April. Some mental confusion noted at times in answering questions Neck - supple Chest - clear BL Heart - RRR, no gallops Neurological - Grip 5/5. Power UEs prox/distal 4/5.  Power LEs 4/5.  Gait not observe Extremities - 1+ BL LE edema  CMP Latest Ref Rng & Units 06/11/2020 05/24/2020 05/23/2020  Glucose 65 - 99 mg/dL 91 172(H) 166(H)  BUN 8 - 27 mg/dL 50(H) 14 16  Creatinine 0.76 - 1.27 mg/dL 3.06(H) 1.29(H) 1.51(H)  Sodium 134 - 144 mmol/L 140 138 138  Potassium 3.5 - 5.2 mmol/L 4.9 3.2(L) 3.5  Chloride 96 - 106 mmol/L 99 98 101  CO2 20 - 29 mmol/L 28 28 29   Calcium 8.6 - 10.2 mg/dL 8.6 8.3(L) 8.1(L)  Total Protein 6.0 - 8.5 g/dL 8.0 - 7.1  Total Bilirubin 0.0 - 1.2 mg/dL 0.4 - 0.9  Alkaline Phos 44 - 121 IU/L 76 - 50  AST 0 - 40 IU/L 10 - 14(L)  ALT 0 - 44 IU/L 17 - 12   Lipid Panel     Component Value Date/Time   CHOL 126 04/22/2019 1121   TRIG 148 01/03/2020 0444   HDL 32 (L) 04/22/2019 1121   CHOLHDL 3.9 04/22/2019 1121   CHOLHDL 2.6 06/10/2016 1253   VLDL 18 06/10/2016 1253   LDLCALC 70 04/22/2019 1121    CBC    Component Value Date/Time   WBC 10.0 06/11/2020 1145   WBC 16.2 (H) 05/24/2020 0454   RBC 3.63 (L) 06/11/2020 1145   RBC 3.23 (L) 05/24/2020 0454   HGB 10.2 (L) 06/11/2020 1145   HCT 33.2 (L) 06/11/2020 1145   PLT 456 (H) 05/24/2020 0454   PLT 357 02/16/2020 1218   MCV 92 06/11/2020 1145   MCH 28.1 06/11/2020 1145   MCH 28.8 05/24/2020 0454   MCHC 30.7 (L) 06/11/2020 1145   MCHC 31.4 05/24/2020 0454   RDW 16.7 (H) 06/11/2020 1145   LYMPHSABS 1.9 06/11/2020 1145   MONOABS 2.3 (H) 05/18/2020 0304   EOSABS 0.3 06/11/2020 1145   BASOSABS 0.1  06/11/2020 1145    ASSESSMENT AND PLAN:  1. Hospital discharge follow-up   2. Perinephric abscess Completed all abx. Will f/u with urology for results of recent CT scan.  Will have my CMA call [facility] to get copy of CT report  3. Chronic combined systolic and  diastolic CHF, NYHA class 3 ([place]) -back on Entresto, Coreg and taking Furosemide BID.  Some LE edema but wgh not significantly changed since hop dischg.   Limit salt intake.  Keep f/u appt with cardiology later this mth  4. Delirium -pt with fluctuating mentation per wife.  I suspect polypharmacy playing role with fluctuations in kidney function.  Advised to hold the Flexeril.  Recheck BMP today  5. History of recent fall 6. Gait abnormality -awaiting mobility chair Refer for home P.T again.  May benefit from RN visits also to assess cardiopulmonary status given CHF   7. Type 2 diabetes mellitus with hypoglycemia without coma, with long-term current use of insulin (HCC) Followed by endocrine [name] whom he saw recently - POCT glucose (manual entry) - Comprehensive metabolic panel - CBC With Differential - POCT glucose (manual entry)  8. Normocytic anemia 9. Retroperitoneal hematoma -pt has already restarted ASA.  Check CBC today to make sure blood count is stable  10. Influenza vaccination declined Recommended.  Pt declined    Orders Placed This Encounter  Procedures  . Comprehensive metabolic panel  . CBC With Differential  . Ambulatory referral to Home Health  . POCT glucose (manual entry)  . POCT glucose (manual entry)     Requested Prescriptions    No prescriptions requested or ordered in this encounter    Return in about 3 weeks (around 07/02/2020).  Karle Plumber, MD, FACP

## 2020-06-12 ENCOUNTER — Ambulatory Visit: Payer: Medicare Other | Admitting: Pulmonary Disease

## 2020-06-12 ENCOUNTER — Telehealth: Payer: Self-pay | Admitting: Internal Medicine

## 2020-06-12 DIAGNOSIS — N289 Disorder of kidney and ureter, unspecified: Secondary | ICD-10-CM

## 2020-06-12 LAB — COMPREHENSIVE METABOLIC PANEL
ALT: 17 IU/L (ref 0–44)
AST: 10 IU/L (ref 0–40)
Albumin/Globulin Ratio: 0.8 — ABNORMAL LOW (ref 1.2–2.2)
Albumin: 3.5 g/dL — ABNORMAL LOW (ref 3.8–4.8)
Alkaline Phosphatase: 76 IU/L (ref 44–121)
BUN/Creatinine Ratio: 16 (ref 10–24)
BUN: 50 mg/dL — ABNORMAL HIGH (ref 8–27)
Bilirubin Total: 0.4 mg/dL (ref 0.0–1.2)
CO2: 28 mmol/L (ref 20–29)
Calcium: 8.6 mg/dL (ref 8.6–10.2)
Chloride: 99 mmol/L (ref 96–106)
Creatinine, Ser: 3.06 mg/dL — ABNORMAL HIGH (ref 0.76–1.27)
GFR calc Af Amer: 24 mL/min/{1.73_m2} — ABNORMAL LOW (ref >59)
GFR calc non Af Amer: 20 mL/min/{1.73_m2} — ABNORMAL LOW (ref >59)
Globulin, Total: 4.5 g/dL (ref 1.5–4.5)
Glucose: 91 mg/dL (ref 65–99)
Potassium: 4.9 mmol/L (ref 3.5–5.2)
Sodium: 140 mmol/L (ref 134–144)
Total Protein: 8 g/dL (ref 6.0–8.5)

## 2020-06-12 LAB — CBC WITH DIFFERENTIAL
Basophils Absolute: 0.1 10*3/uL (ref 0.0–0.2)
Basos: 1 %
EOS (ABSOLUTE): 0.3 10*3/uL (ref 0.0–0.4)
Eos: 3 %
Hematocrit: 33.2 % — ABNORMAL LOW (ref 37.5–51.0)
Hemoglobin: 10.2 g/dL — ABNORMAL LOW (ref 13.0–17.7)
Immature Grans (Abs): 0.1 10*3/uL (ref 0.0–0.1)
Immature Granulocytes: 1 %
Lymphocytes Absolute: 1.9 10*3/uL (ref 0.7–3.1)
Lymphs: 19 %
MCH: 28.1 pg (ref 26.6–33.0)
MCHC: 30.7 g/dL — ABNORMAL LOW (ref 31.5–35.7)
MCV: 92 fL (ref 79–97)
Monocytes Absolute: 0.9 10*3/uL (ref 0.1–0.9)
Monocytes: 9 %
Neutrophils Absolute: 6.9 10*3/uL (ref 1.4–7.0)
Neutrophils: 67 %
RBC: 3.63 x10E6/uL — ABNORMAL LOW (ref 4.14–5.80)
RDW: 16.7 % — ABNORMAL HIGH (ref 11.6–15.4)
WBC: 10 10*3/uL (ref 3.4–10.8)

## 2020-06-12 NOTE — Telephone Encounter (Signed)
PC placed to pt and his wife tonight to discuss labs don yesterday.   Pt sound more alert and with it tonight. Reports that he had a good day today and feeling better than he dis yesterday. I informed him that his kidney function has worsened again compared to what it was at time of hosp dischg 2 wks ago.   I recommend: 1.  Hold Lyrica 2.  Hold Entresto 3.  Decrease Furosemide to 80 mg once a day 4.  Okay to continue ASA as blood count stable and slightly better compared to hospital discharge.  Return to lab on Friday for recheck kidney function.  Both pt and his wife were able to repeat back these instructions to me.  Pt requesting a home health aide.  I will have our case worker address.

## 2020-06-13 ENCOUNTER — Telehealth: Payer: Self-pay

## 2020-06-13 NOTE — Telephone Encounter (Signed)
Referral received for home health RN and PT.  Call placed to patient to inform him of above noted referral.  He would like to have the referral sent to [facility] because he has recently received home health services from their agency.  This CM explained that the referral will be sent to [facility].  If they are not able to accept it, then other agencies will  be contacted and the patient was in agreement with this plan. Referral then faxed to [facility] for review  - fax # [phone].  The patient stated that he is also in need of assistance with his meals.  This CM explained to him that a referral was made to [facility] in 03/2020 and he was denied services because he did not require much assistance with ADLS.  This CM also explained to him that to qualify for PCS the person must require assistance with bathing/ dressing/ mobility.  The personal care aides are not there for just meal preparation and housekeeping.  The patient stated that he understood and said that he does need help with dressing and bathing and would like another referral to be placed.

## 2020-06-14 ENCOUNTER — Telehealth: Payer: Self-pay

## 2020-06-14 NOTE — Telephone Encounter (Signed)
Call placed to [facility], spoke to [place] who confirmed that they received and accepted the referral.

## 2020-06-15 ENCOUNTER — Other Ambulatory Visit: Payer: Self-pay

## 2020-06-15 ENCOUNTER — Ambulatory Visit (INDEPENDENT_AMBULATORY_CARE_PROVIDER_SITE_OTHER): Payer: Medicare Other | Admitting: Pulmonary Disease

## 2020-06-15 ENCOUNTER — Encounter: Payer: Self-pay | Admitting: Pulmonary Disease

## 2020-06-15 VITALS — BP 120/72 | HR 54 | Temp 97.4°F | Ht 66.0 in | Wt 223.4 lb

## 2020-06-15 DIAGNOSIS — Z0000 Encounter for general adult medical examination without abnormal findings: Secondary | ICD-10-CM | POA: Insufficient documentation

## 2020-06-15 DIAGNOSIS — I428 Other cardiomyopathies: Secondary | ICD-10-CM

## 2020-06-15 DIAGNOSIS — G4733 Obstructive sleep apnea (adult) (pediatric): Secondary | ICD-10-CM

## 2020-06-15 DIAGNOSIS — I5042 Chronic combined systolic (congestive) and diastolic (congestive) heart failure: Secondary | ICD-10-CM | POA: Diagnosis not present

## 2020-06-15 NOTE — Assessment & Plan Note (Signed)
Plan: Recommend follow-up with cardiology

## 2020-06-15 NOTE — Patient Instructions (Addendum)
You were seen today by Lauraine Rinne, NP  for:   1. OSA (obstructive sleep apnea)  We will send a message to [employer] so they can work with you to get your BiPAP fixed   Please also call [employer]  We recommend that you resume using your BIPAP daily >>>Keep up the hard work using your device >>> Goal should be wearing this for the entire night that you are sleeping, at least 4 to 6 hours  Remember:  . Do not drive or operate heavy machinery if tired or drowsy.  . Please notify the supply company and office if you are unable to use your device regularly due to missing supplies or machine being broken.  . Work on maintaining a healthy weight and following your recommended nutrition plan  . Maintain proper daily exercise and movement  . Maintaining proper use of your device can also help improve management of other chronic illnesses such as: Blood pressure, blood sugars, and weight management.   BiPAP/ CPAP Cleaning:  >>>Clean weekly, with Dawn soap, and bottle brush.  Set up to air dry. >>> Wipe mask out daily with wet wipe or towelette    2. Healthcare maintenance  We strongly recommend the seasonal flu vaccine, you declined this today   3. Chronic combined systolic and diastolic CHF (congestive heart failure) ([place]) 4. NICM (nonischemic cardiomyopathy) ([place])  Keep follow-up with cardiology and primary care    Follow Up:    Return in about 2 months (around 08/15/2020), or if symptoms worsen or fail to improve, for Follow up with Dr. Annamaria Boots.   Notification of test results are managed in the following manner: If there are  any recommendations or changes to the  plan of care discussed in office today,  we will contact you and let you know what they are. If you do not hear from [place], then your results are normal and you can view them through your  MyChart account , or a letter will be sent to you. Thank you again for trusting [place] with your care  - Thank you, [facility]    It  is flu season:   >>> Best ways to protect herself from the flu: Receive the yearly flu vaccine, practice good hand hygiene washing with soap and also using hand sanitizer when available, eat a nutritious meals, get adequate rest, hydrate appropriately       Please contact the office if your symptoms worsen or you have concerns that you are not improving.   Thank you for choosing [facility] for your healthcare, and for allowing [place] to partner with you on your healthcare journey. I am thankful to be able to provide care to you today.   Wyn Quaker FNP-C

## 2020-06-15 NOTE — Progress Notes (Signed)
@Patient  ID: Marco [name], male    DOB: 16-Sep-1955, 64 y.o.   MRN: [id number]  Chief Complaint  Patient presents with  . Follow-up    OSA, doing well    Referring provider: [name], MD  HPI:  64 year old male former smoker followed in our office for obstructive sleep apnea  PMH: Type 2 diabetes, hypertension, CHF, GERD, history of sepsis, rheumatoid arthritis Smoker/ Smoking History: Former smoker.  Quit 2009.  5-pack-year smoking history Maintenance:   Pt of: Patient of Dr. Annamaria Boots  06/15/2020  - Visit   64 year old male former smoker followed in our office for obstructive sleep apnea.  He is followed by Dr. Annamaria Boots.  He is presented to our office today as a follow-up.  Unfortunately it sounds like his BiPAP is currently broken.  He has contacted [facility] but he has not yet found the time to bring his machine up for have this evaluated.  We will discuss this today.  Up-to-date with COVID-19 vaccinations  Patient is refusing the seasonal flu vaccine.  We will discuss this today.  Patient reporting today that his primary care provider is holding some of his medications.  Last office visit from PCP was on 06/11/2020.  The assessment and plan from that office visit is listed below:   ASSESSMENT AND PLAN:  1. Hospital discharge follow-up   2. Perinephric abscess Completed all abx. Will f/u with urology for results of recent CT scan.  Will have my CMA call [facility] to get copy of CT report  3. Chronic combined systolic and diastolic CHF, NYHA class 3 (HCC) -back on Entresto, Coreg and taking Furosemide BID.  Some LE edema but wgh not significantly changed since hop dischg.   Limit salt intake.  Keep f/u appt with cardiology later this mth  4. Delirium -pt with fluctuating mentation per wife.  I suspect polypharmacy playing role with fluctuations in kidney function.  Advised to hold the Flexeril.  Recheck BMP today  5. History of recent fall 6. Gait  abnormality -awaiting mobility chair Refer for home P.T again.  May benefit from RN visits also to assess cardiopulmonary status given CHF   7. Type 2 diabetes mellitus with hypoglycemia without coma, with long-term current use of insulin (HCC) Followed by endocrine [name] whom he saw recently - POCT glucose (manual entry) - Comprehensive metabolic panel - CBC With Differential - POCT glucose (manual entry)  8. Normocytic anemia 9. Retroperitoneal hematoma -pt has already restarted ASA.  Check CBC today to make sure blood count is stable  10. Influenza vaccination declined Recommended.  Pt declined  She denies any respiratory complaints today.  He continues to decline the seasonal flu vaccine.  He is unable to verbalize why he does not believe in receiving the flu vaccine.  Questionaires / Pulmonary Flowsheets:   ACT:  No flowsheet data found.  MMRC: No flowsheet data found.  Epworth:  No flowsheet data found.  Tests:   NPSG 37.8/hour on 06/14/2015, desaturation to 76%, body weight 223 pounds BiPAP titration 08/19/2015- to 20/16 Barium Swallow 02/07/2016-normal CXR 01/22/2016-NAD PFT 02/22/2016-minimal restriction, mild diffusion deficit. FVC 2.62/76%, FEV1 2.14/81%, ratio 0.82, DLCO 61%, TLC 79%, RV/TLC 135%. 6MWT- 02/22/16-97%, 98%, 98%. Stopped after only 54 m complaining of chest tightness, headache, shortness of breath, dizziness. BP and heart rate were unremarkable. Unattended Home Sleep Test-05/25/2016-AHI 36.9/hour, desaturation to 82%, body weight 212 pounds   FENO:  No results found for: NITRICOXIDE  PFT: PFT Results Latest  Ref Rng & Units 02/22/2016  FVC-Pre L 2.74  FVC-Predicted Pre % 80  FVC-Post L 2.62  FVC-Predicted Post % 76  Pre FEV1/FVC % % 85  Post FEV1/FCV % % 82  FEV1-Pre L 2.32  FEV1-Predicted Pre % 87  FEV1-Post L 2.14  DLCO uncorrected ml/min/mmHg 16.61  DLCO UNC% % 61  DLCO corrected ml/min/mmHg 17.51  DLCO COR %Predicted %  64  DLVA Predicted % 100  TLC L 4.95  TLC % Predicted % 79  RV % Predicted % 108    WALK:  SIX MIN WALK 02/22/2016  Medications Allopurinol, Carvedilol, Diltiazem, Furosemide, Metformin, Protonix, Predinsone  Supplimental Oxygen during Test? (L/min) No  Laps 2  Partial Lap (in Meters) 0  Baseline BP (sitting) 110/70  Baseline Heartrate 71  Baseline Dyspnea (Borg Scale) 2  Baseline Fatigue (Borg Scale) 2  Baseline SPO2 97  BP (sitting) 118/76  Heartrate 74  Dyspnea (Borg Scale) 3  Fatigue (Borg Scale) 3  SPO2 98  BP (sitting) 112/70  Heartrate 76  SPO2 98  Stopped or Paused before Six Minutes Yes  Other Symptoms at end of Exercise Please see below.  Interpretation Dizziness  Distance Completed 96  Tech Comments: Pt did not complete the entire 6 minutes. The timer was 3 minutes and 45 secs remaining when the pt had to stop due to chest tightness, headache, SOB and dizziness.    Imaging: CT ABDOMEN PELVIS WO CONTRAST  Result Date: 05/18/2020 CLINICAL DATA:  Retroperitoneal hemorrhage, retroperitoneal abscess EXAM: CT ABDOMEN AND PELVIS WITHOUT CONTRAST TECHNIQUE: Multidetector CT imaging of the abdomen and pelvis was performed following the standard protocol without IV contrast. COMPARISON:  05/15/2020 FINDINGS: Lower chest: Trace left and small right pleural effusions are present, decreased in size since prior examination. Bibasilar atelectasis is again identified. Minimal coronary artery calcification. Global cardiac size within normal limits. Hepatobiliary: Wedge like regions of a macroscopic fat within the left hepatic lobe are stable, and unchanged from prior MRI examination of 03/22/2020. Mild asymmetric parenchymal thickening and perihepatic inflammatory stranding adjacent to the drained pararenal abscess is again identified and appears stable since prior examination, not well assessed on this noncontrast examination. Vicarious excretion of contrast noted within the gallbladder  lumen. The gallbladder is otherwise unremarkable. No intra or extrahepatic biliary ductal dilation. Pancreas: Unremarkable Spleen: Unremarkable Adrenals/Urinary Tract: The adrenal glands are unremarkable. Left kidney is normal in size and position. Exophytic renal cyst is again noted arising from the lower pole of the left kidney. Percutaneous drainage catheter is again seen coursing into the pararenal space adjacent to the upper pole of the right kidney. Large subcapsular hematoma is again identified involving the right kidney with infiltrative high density material compatible with blood extending throughout the pararenal space and layering along Gerota's fascia. This appears stable since prior examination. When measured in similar fashion, the hematoma measures 8.7 x 5.6 cm in dimension. No hydronephrosis. No intrarenal or ureteral calculi. The bladder is decompressed with a Foley catheter balloon seen within its lumen. Stomach/Bowel: Stomach, small bowel, are unremarkable. Surgical changes of right hemicolectomy are identified the residual large bowel is unremarkable save for mild scattered sigmoid diverticulosis. No free intraperitoneal gas. Trace free fluid within the pelvis is unchanged. Vascular/Lymphatic: Mild aortoiliac atherosclerotic calcification without evidence of aneurysm. No pathologic adenopathy within the abdomen and pelvis. Reproductive: Status post hysterectomy. No adnexal masses. Other: Three rectum unremarkable. Mild subcutaneous edema within the flanks bilaterally is stable. Musculoskeletal: No lytic or blastic bone lesions are identified.  IMPRESSION: Stable examination. Drainage catheter noted within the pararenal space adjacent to the upper pole the right kidney. Large subcapsular hematoma with hemorrhagic material seen infiltrating within the pararenal space unchanged from prior examination. Poorly delineated inflammatory changes involving the a posterior right hepatic lobe adjacent to the  previously noted pararenal abscess is again identified and, while not well assessed on this noncontrast examination, appears stable. Electronically Signed   By: Fidela Salisbury MD   On: 05/18/2020 21:59   DG CHEST PORT 1 VIEW  Result Date: 05/22/2020 CLINICAL DATA:  Fever EXAM: PORTABLE CHEST 1 VIEW COMPARISON:  May 17, 2020 FINDINGS: There is atelectatic change in the region of the right minor fissure. Lungs elsewhere are clear. Heart is borderline enlarged with pulmonary vascularity normal. No adenopathy. No bone lesions. Occasional foci of carotid artery calcification noted bilaterally. IMPRESSION: Atelectatic change right minor fissure region. Lungs otherwise clear. Stable cardiac silhouette. Calcification noted in each carotid artery. Electronically Signed   By: Lowella Grip III M.D.   On: 05/22/2020 09:13   DG CHEST PORT 1 VIEW  Result Date: 05/17/2020 CLINICAL DATA:  Recent aspiration EXAM: PORTABLE CHEST 1 VIEW COMPARISON:  05/16/2020 FINDINGS: Cardiac shadow is enlarged but stable. Tortuous thoracic aorta is seen. Mild thickening of the minor fissure is noted consistent with small right-sided pleural effusion. Drainage catheter is noted in the right upper quadrant. Previously seen bibasilar opacities have improved in the interval from the prior exam. IMPRESSION: Improved aeration in the bases bilaterally. No new focal abnormality is noted. Electronically Signed   By: Inez Catalina M.D.   On: 05/17/2020 20:12    Lab Results:  CBC    Component Value Date/Time   WBC 10.0 06/11/2020 1145   WBC 16.2 (H) 05/24/2020 0454   RBC 3.63 (L) 06/11/2020 1145   RBC 3.23 (L) 05/24/2020 0454   HGB 10.2 (L) 06/11/2020 1145   HCT 33.2 (L) 06/11/2020 1145   PLT 456 (H) 05/24/2020 0454   PLT 357 02/16/2020 1218   MCV 92 06/11/2020 1145   MCH 28.1 06/11/2020 1145   MCH 28.8 05/24/2020 0454   MCHC 30.7 (L) 06/11/2020 1145   MCHC 31.4 05/24/2020 0454   RDW 16.7 (H) 06/11/2020 1145   LYMPHSABS  1.9 06/11/2020 1145   MONOABS 2.3 (H) 05/18/2020 0304   EOSABS 0.3 06/11/2020 1145   BASOSABS 0.1 06/11/2020 1145    BMET    Component Value Date/Time   NA 140 06/11/2020 1145   K 4.9 06/11/2020 1145   CL 99 06/11/2020 1145   CO2 28 06/11/2020 1145   GLUCOSE 91 06/11/2020 1145   GLUCOSE 172 (H) 05/24/2020 0454   BUN 50 (H) 06/11/2020 1145   CREATININE 3.06 (H) 06/11/2020 1145   CREATININE 1.42 (H) 09/09/2016 1037   CALCIUM 8.6 06/11/2020 1145   GFRNONAA 20 (L) 06/11/2020 1145   GFRNONAA 58 (L) 05/24/2020 0454   GFRNONAA 53 (L) 09/09/2016 1037   GFRAA 24 (L) 06/11/2020 1145   GFRAA 61 09/09/2016 1037    BNP    Component Value Date/Time   BNP 497.6 (H) 01/01/2020 1846   BNP 147.8 (H) 09/02/2016 1040    ProBNP    Component Value Date/Time   PROBNP 624 (H) 07/06/2018 1117    Specialty Problems      Pulmonary Problems   OSA (obstructive sleep apnea)    NPSG 37.8/hour on 06/14/2015, desaturation to 76%, body weight 223 pounds Unattended Home Sleep Test-05/25/2016-AHI 36.9/hour, desaturation to 82%, body weight 212  pounds       Sinusitis, chronic   Shortness of breath    PFT 02/22/2016-minimal restriction, mild diffusion deficit. FVC 2.62/76%, FEV1 2.14/81%, ratio 0.82, DLCO 61%, TLC 79%, RV/TLC 135%. 6MWT- 02/22/16-97%, 98%, 98%. Stopped after only 57 m complaining of chest tightness, headache, shortness of breath, dizziness. BP and heart rate were unremarkable.      Acute respiratory failure with hypoxia (HCC)   Rhinitis   Loculated pleural effusion   Pleural effusion      Allergies  Allergen Reactions  . Nsaids Other (See Comments)    Stomach pains. Ulcers - stated by patient   . Jardiance [Empagliflozin] Nausea And Vomiting    Immunization History  Administered Date(s) Administered  . PFIZER SARS-COV-2 Vaccination 11/03/2019, 11/24/2019   Recommend the flu vaccine, pt declines    Past Medical History:  Diagnosis Date  . Benign colon polyp  08/01/2013   [facility] in [place]. large base tranverse colon polyp was biopsied. polyp was benign with minimal surface hyperplastic change.  . Chronic combined systolic and diastolic CHF (congestive heart failure) ([place])   . Chronic pain   . CKD (chronic kidney disease), stage III ([place])   . Diabetes mellitus without complication ([place])   . Diverticulosis   . Esophageal hiatal hernia 07/29/2013   confirmed on EGD   . Esophageal stricture   . Essential hypertension   . Gastritis 07/29/2013   confirmed on EGD, bx done an negative for intestinal metaplasia, dsyplasia or H. pylori. normal gastric emptying study done 07/13/2013.  [name] GERD (gastroesophageal reflux disease)   . Morbid obesity ([place])   . Non-obstructive CAD    a. 02/2013 Cath ([place]): nonobs dzs;  b. 09/2014 Myoview ([place]): EF 55%, no ischemia;  c. Cath 05/2018 mild CAD no obstruction.  . Persistent atrial fibrillation ([place])    a. 02/2013 s/p rfca in [place], [place]-->prev on Xarelto, d/c'd 2/2 anemia, ? GIB.  [name] Rheumatoid arthritis ([place])   . Sigmoid diverticulosis 08/01/2013   confirmed on colonscopy. record scanned into chart    Tobacco History: Social History   Tobacco Use  Smoking Status Former Smoker  . Packs/day: 0.50  . Years: 10.00  . Pack years: 5.00  . Types: Cigarettes  . Quit date: 04/11/2008  . Years since quitting: 12.1  Smokeless Tobacco Never Used   Counseling given: Not Answered   Continue to not smoke  Outpatient Encounter Medications as of 06/15/2020  Medication Sig  . Accu-Chek FastClix Lancets MISC 1 each by Other route 2 (two) times daily. E11.42  . ACCU-CHEK GUIDE test strip USE 1 STRIP TWICE DAILY TO CHECK BLOOD GLUCOSE  . albuterol (PROVENTIL HFA;VENTOLIN HFA) 108 (90 Base) MCG/ACT inhaler Inhale 2 puffs into the lungs every 6 (six) hours as needed for wheezing or shortness of breath.  . allopurinol (ZYLOPRIM) 100 MG tablet Take 200 mg by mouth every evening.   [name] aspirin (EQ ASPIRIN  ADULT LOW DOSE) 81 MG EC tablet Take 1 tablet (81 mg total) by mouth daily. TAKE 1 TABLET BY MOUTH ONCE DAILY. SWALLOW WHOLE Please hold asa until you repeat blood work, resume if blood count stable and ok with you primary care doctor  . atorvastatin (LIPITOR) 40 MG tablet Take 1 tablet (40 mg total) by mouth daily.  . carvedilol (COREG) 12.5 MG tablet TAKE 1 TABLET BY MOUTH TWICE DAILY WITH A MEAL  . diclofenac Sodium (VOLTAREN) 1 % GEL Apply 4 g topically 4 (four) times daily. (Patient  taking differently: Apply 4 g topically 4 (four) times daily as needed (pain). )  . DULoxetine (CYMBALTA) 20 MG capsule TAKE 1 CAPSULE BY MOUTH ONCE DAILY (Patient taking differently: Take 20 mg by mouth daily. )  . famotidine (PEPCID) 20 MG tablet TAKE 1 TABLET BY MOUTH TWO TIMES DAILY (Patient taking differently: Take 20 mg by mouth 2 (two) times daily. )  . ferrous sulfate 325 (65 FE) MG tablet Take 1 tablet (325 mg total) by mouth daily with breakfast.  . furosemide (LASIX) 80 MG tablet Take 80 mg by mouth daily.   . Insulin Lispro Prot & Lispro (HUMALOG MIX 75/25 KWIKPEN) (75-25) 100 UNIT/ML Kwikpen Inject 60 Units into the skin daily with breakfast. Start with 30units daily, please check blood glucose three times a day and using sliding scale insulin if indicated per sliding scale, please see Dr Loanne Drilling in one week  . isosorbide mononitrate (IMDUR) 30 MG 24 hr tablet Take 30 mg by mouth daily.  . naloxone (NARCAN) nasal spray 4 mg/0.1 mL Place in nostril in the event of suspected overdose on Narcotic medication (Oxycodone)  . oxyCODONE (OXY IR/ROXICODONE) 5 MG immediate release tablet Take 5 mg by mouth 3 (three) times daily as needed for moderate pain.   . polyethylene glycol (MIRALAX / GLYCOLAX) 17 g packet Take 17 g by mouth daily as needed for moderate constipation or severe constipation.  . Potassium Chloride ER 20 MEQ TBCR Take 20 mEq by mouth daily.   . predniSONE (DELTASONE) 5 MG tablet Take 5 mg by  mouth daily.  . ramelteon (ROZEREM) 8 MG tablet TAKE 1 TABLET BY MOUTH AT BEDTIME (Patient taking differently: Take 8 mg by mouth at bedtime. )  . Secukinumab (COSENTYX) 150 MG/ML SOSY Inject 150 mg into the skin every 28 (twenty-eight) days. Hold until told to resume by your rheumatologist  . Tafamidis (VYNDAMAX) 61 MG CAPS Take 61 mg by mouth daily.  [name] UNIFINE PENTIPS 32G X 4 MM MISC USE AS DIRECTED DAILY  . pregabalin (LYRICA) 100 MG capsule Take 100 mg by mouth 3 (three) times daily.  (Patient not taking: Reported on 06/15/2020)  . sacubitril-valsartan (ENTRESTO) 24-26 MG Take 1 tablet by mouth 2 (two) times daily. (Patient not taking: Reported on 06/15/2020)   No facility-administered encounter medications on file as of 06/15/2020.     Review of Systems  Review of Systems  Constitutional: Negative for activity change, chills, fatigue, fever and unexpected weight change.  HENT: Negative for postnasal drip, rhinorrhea, sinus pressure, sinus pain and sore throat.   Eyes: Negative.   Respiratory: Negative for cough, shortness of breath and wheezing.   Cardiovascular: Negative for chest pain and palpitations.  Gastrointestinal: Negative for constipation, diarrhea, nausea and vomiting.  Endocrine: Negative.   Genitourinary: Negative.   Musculoskeletal: Negative.   Skin: Negative.   Neurological: Negative for dizziness and headaches.  Psychiatric/Behavioral: Negative.  Negative for dysphoric mood. The patient is not nervous/anxious.   All other systems reviewed and are negative.    Physical Exam  BP 120/72 (BP Location: Left Arm, Cuff Size: Normal)   Pulse (!) 54   Temp (!) 97.4 F (36.3 C) (Oral)   Ht 5\' 6"  (1.676 m)   Wt 223 lb 6.4 oz (101.3 kg)   SpO2 96%   BMI 36.06 kg/m   Wt Readings from Last 5 Encounters:  06/15/20 223 lb 6.4 oz (101.3 kg)  06/11/20 221 lb (100.2 kg)  06/06/20 222 lb 1.6 oz (100.7 kg)  05/24/20 223 lb 8.7 oz (101.4 kg)  03/05/20 (!) 234 lb 3.2 oz  (106.2 kg)    BMI Readings from Last 5 Encounters:  06/15/20 36.06 kg/m  06/11/20 35.67 kg/m  06/06/20 35.85 kg/m  05/24/20 36.08 kg/m  03/05/20 37.80 kg/m     Physical Exam Vitals and nursing note reviewed.  Constitutional:      General: He is not in acute distress.    Appearance: Normal appearance. He is obese.  HENT:     Head: Normocephalic and atraumatic.     Right Ear: Hearing and external ear normal.     Left Ear: Hearing and external ear normal.     Nose: Nose normal. No mucosal edema or rhinorrhea.     Right Turbinates: Not enlarged.     Left Turbinates: Not enlarged.     Mouth/Throat:     Mouth: Mucous membranes are dry.     Pharynx: Oropharynx is clear. No oropharyngeal exudate.  Eyes:     Pupils: Pupils are equal, round, and reactive to light.  Cardiovascular:     Rate and Rhythm: Normal rate and regular rhythm.     Pulses: Normal pulses.     Heart sounds: Normal heart sounds. No murmur heard.   Pulmonary:     Effort: Pulmonary effort is normal.     Breath sounds: Normal breath sounds. No decreased breath sounds, wheezing or rales.  Musculoskeletal:     Cervical back: Normal range of motion.     Right lower leg: No edema.     Left lower leg: No edema.  Lymphadenopathy:     Cervical: No cervical adenopathy.  Skin:    General: Skin is warm and dry.     Capillary Refill: Capillary refill takes less than 2 seconds.     Findings: No erythema or rash.  Neurological:     General: No focal deficit present.     Mental Status: He is alert and oriented to person, place, and time.     Motor: No weakness.     Coordination: Coordination normal.     Gait: Gait is intact. Gait normal.  Psychiatric:        Mood and Affect: Mood normal. Affect is flat.        Behavior: Behavior normal. Behavior is cooperative.        Thought Content: Thought content normal.        Judgment: Judgment normal.       Assessment & Plan:   NICM (nonischemic cardiomyopathy)  (HCC) Plan: Recommend follow-up with cardiology  Chronic combined systolic and diastolic CHF (congestive heart failure) (HCC) Plan: Recommend follow-up with cardiology  OSA (obstructive sleep apnea) Plan: Community message sent to [facility] to follow-up with the patient as his machine is broken Encourage patient to contact [facility] on his own accord Emphasized importance the patient that he needs to follow-up with [facility] quickly as he needs to be maintained on his VPAP Follow-up in 2 months with Dr. Annamaria Boots  Healthcare maintenance Patient is up-to-date with COVID-19 vaccinations  We discussed the importance of the seasonal flu vaccine given his multiple comorbidities and high risk for complications of the flu.  Patient is aware he is at risk by being on vaccinated flu.  He continues to decline the flu vaccine despite this information    Return in about 2 months (around 08/15/2020), or if symptoms worsen or fail to improve, for Follow up with Dr. Annamaria Boots.   Lauraine Rinne, NP 06/15/2020  This appointment required 32 minutes of patient care (this includes precharting, chart review, review of results, face-to-face care, etc.).

## 2020-06-15 NOTE — Assessment & Plan Note (Signed)
Patient is up-to-date with COVID-19 vaccinations  We discussed the importance of the seasonal flu vaccine given his multiple comorbidities and high risk for complications of the flu.  Patient is aware he is at risk by being on vaccinated flu.  He continues to decline the flu vaccine despite this information

## 2020-06-15 NOTE — Assessment & Plan Note (Signed)
Plan: Community message sent to [facility] to follow-up with the patient as his machine is broken Encourage patient to contact [facility] on his own accord Emphasized importance the patient that he needs to follow-up with [facility] quickly as he needs to be maintained on his VPAP Follow-up in 2 months with Dr. Annamaria Boots

## 2020-06-18 ENCOUNTER — Other Ambulatory Visit: Payer: Self-pay

## 2020-06-18 ENCOUNTER — Ambulatory Visit: Payer: Medicare Other | Attending: Internal Medicine

## 2020-06-18 DIAGNOSIS — N289 Disorder of kidney and ureter, unspecified: Secondary | ICD-10-CM

## 2020-06-18 MED FILL — ACCU-CHEK GUIDE TEST STRIP: 50 days supply | Qty: 100 | Fill #2

## 2020-06-19 ENCOUNTER — Telehealth: Payer: Self-pay

## 2020-06-19 ENCOUNTER — Other Ambulatory Visit: Payer: Self-pay | Admitting: Endocrinology

## 2020-06-19 DIAGNOSIS — E1159 Type 2 diabetes mellitus with other circulatory complications: Secondary | ICD-10-CM

## 2020-06-19 DIAGNOSIS — Z794 Long term (current) use of insulin: Secondary | ICD-10-CM

## 2020-06-19 LAB — BASIC METABOLIC PANEL
BUN/Creatinine Ratio: 9 — ABNORMAL LOW (ref 10–24)
BUN: 17 mg/dL (ref 8–27)
CO2: 28 mmol/L (ref 20–29)
Calcium: 8.8 mg/dL (ref 8.6–10.2)
Chloride: 103 mmol/L (ref 96–106)
Creatinine, Ser: 1.84 mg/dL — ABNORMAL HIGH (ref 0.76–1.27)
GFR calc Af Amer: 43 mL/min/{1.73_m2} — ABNORMAL LOW (ref >59)
GFR calc non Af Amer: 38 mL/min/{1.73_m2} — ABNORMAL LOW (ref >59)
Glucose: 108 mg/dL — ABNORMAL HIGH (ref 65–99)
Potassium: 3.8 mmol/L (ref 3.5–5.2)
Sodium: 143 mmol/L (ref 134–144)

## 2020-06-19 NOTE — Telephone Encounter (Signed)
PCS form faxed to [facility] 

## 2020-06-20 ENCOUNTER — Encounter: Payer: Self-pay | Admitting: Internal Medicine

## 2020-06-20 ENCOUNTER — Other Ambulatory Visit: Payer: Self-pay | Admitting: Internal Medicine

## 2020-06-24 ENCOUNTER — Telehealth: Payer: Self-pay | Admitting: Internal Medicine

## 2020-06-24 DIAGNOSIS — K750 Abscess of liver: Secondary | ICD-10-CM

## 2020-06-24 DIAGNOSIS — N151 Renal and perinephric abscess: Secondary | ICD-10-CM

## 2020-06-24 NOTE — Telephone Encounter (Signed)
I received note from Dr. Claudia Desanctis, the urologist.  She saw him in f/u 06/21/2020.  Per her note repeat imaging showed improvement of hepatic abscesses and continued presence of right perinephrotic hematoma without evidence of infection.  Patient's urinalysis was negative for infection and he was asymptomatic.  She plans to repeat CAT scan in 3 months to assess for interval change.  He was instructed to be seen in the emergency room if he develops fever, chills or altered mental status. For his ureteral calculus, she discussed options for the 4 mm stone that has been present for over a month.  Patient did not want ureteroscopy but may be interested in ESWL.  He will be scheduled to have a KUB done to see if it is visible.  I plan to refer the patient to infectious disease given his history of repeat right renal abscess with liver abscesses.

## 2020-06-25 NOTE — Telephone Encounter (Signed)
ATC pt, no answer, LM to [facility], CRM created for follow-up.

## 2020-06-27 ENCOUNTER — Ambulatory Visit (HOSPITAL_COMMUNITY)
Admit: 2020-06-27 | Discharge: 2020-06-27 | Disposition: A | Discharge status: Home / Self Care | Payer: Medicare Other | Source: Ambulatory Visit | Attending: Internal Medicine | Admitting: Internal Medicine

## 2020-06-27 ENCOUNTER — Other Ambulatory Visit: Payer: Self-pay

## 2020-06-27 ENCOUNTER — Encounter (HOSPITAL_COMMUNITY): Payer: Self-pay | Admitting: Internal Medicine

## 2020-06-27 VITALS — BP 130/80 | HR 73 | Wt 213.8 lb

## 2020-06-27 DIAGNOSIS — I2510 Atherosclerotic heart disease of native coronary artery without angina pectoris: Secondary | ICD-10-CM | POA: Insufficient documentation

## 2020-06-27 DIAGNOSIS — I5042 Chronic combined systolic (congestive) and diastolic (congestive) heart failure: Secondary | ICD-10-CM | POA: Diagnosis not present

## 2020-06-27 DIAGNOSIS — M069 Rheumatoid arthritis, unspecified: Secondary | ICD-10-CM | POA: Diagnosis not present

## 2020-06-27 DIAGNOSIS — Z7952 Long term (current) use of systemic steroids: Secondary | ICD-10-CM | POA: Diagnosis not present

## 2020-06-27 DIAGNOSIS — D472 Monoclonal gammopathy: Secondary | ICD-10-CM | POA: Insufficient documentation

## 2020-06-27 DIAGNOSIS — X58XXXA Exposure to other specified factors, initial encounter: Secondary | ICD-10-CM | POA: Diagnosis not present

## 2020-06-27 DIAGNOSIS — G4733 Obstructive sleep apnea (adult) (pediatric): Secondary | ICD-10-CM | POA: Insufficient documentation

## 2020-06-27 DIAGNOSIS — E1122 Type 2 diabetes mellitus with diabetic chronic kidney disease: Secondary | ICD-10-CM | POA: Diagnosis not present

## 2020-06-27 DIAGNOSIS — K222 Esophageal obstruction: Secondary | ICD-10-CM | POA: Diagnosis not present

## 2020-06-27 DIAGNOSIS — I43 Cardiomyopathy in diseases classified elsewhere: Secondary | ICD-10-CM | POA: Diagnosis not present

## 2020-06-27 DIAGNOSIS — Z8249 Family history of ischemic heart disease and other diseases of the circulatory system: Secondary | ICD-10-CM | POA: Insufficient documentation

## 2020-06-27 DIAGNOSIS — I130 Hypertensive heart and chronic kidney disease with heart failure and stage 1 through stage 4 chronic kidney disease, or unspecified chronic kidney disease: Secondary | ICD-10-CM | POA: Diagnosis not present

## 2020-06-27 DIAGNOSIS — Z794 Long term (current) use of insulin: Secondary | ICD-10-CM | POA: Insufficient documentation

## 2020-06-27 DIAGNOSIS — Z886 Allergy status to analgesic agent status: Secondary | ICD-10-CM | POA: Insufficient documentation

## 2020-06-27 DIAGNOSIS — I4819 Other persistent atrial fibrillation: Secondary | ICD-10-CM | POA: Diagnosis not present

## 2020-06-27 DIAGNOSIS — F40240 Claustrophobia: Secondary | ICD-10-CM | POA: Insufficient documentation

## 2020-06-27 DIAGNOSIS — K750 Abscess of liver: Secondary | ICD-10-CM | POA: Insufficient documentation

## 2020-06-27 DIAGNOSIS — D649 Anemia, unspecified: Secondary | ICD-10-CM | POA: Insufficient documentation

## 2020-06-27 DIAGNOSIS — Z7982 Long term (current) use of aspirin: Secondary | ICD-10-CM | POA: Diagnosis not present

## 2020-06-27 DIAGNOSIS — N151 Renal and perinephric abscess: Secondary | ICD-10-CM | POA: Insufficient documentation

## 2020-06-27 DIAGNOSIS — N1832 Chronic kidney disease, stage 3b: Secondary | ICD-10-CM | POA: Diagnosis not present

## 2020-06-27 DIAGNOSIS — Z888 Allergy status to other drugs, medicaments and biological substances status: Secondary | ICD-10-CM | POA: Insufficient documentation

## 2020-06-27 DIAGNOSIS — E854 Organ-limited amyloidosis: Secondary | ICD-10-CM | POA: Insufficient documentation

## 2020-06-27 DIAGNOSIS — Z79899 Other long term (current) drug therapy: Secondary | ICD-10-CM | POA: Diagnosis not present

## 2020-06-27 DIAGNOSIS — S37011A Minor contusion of right kidney, initial encounter: Secondary | ICD-10-CM | POA: Diagnosis not present

## 2020-06-27 LAB — BASIC METABOLIC PANEL
Anion gap: 10 (ref 5–15)
BUN: 15 mg/dL (ref 8–23)
CO2: 27 mmol/L (ref 22–32)
Calcium: 8.9 mg/dL (ref 8.9–10.3)
Chloride: 102 mmol/L (ref 98–111)
Creatinine, Ser: 1.65 mg/dL — ABNORMAL HIGH (ref 0.61–1.24)
GFR, Estimated: 46 mL/min — ABNORMAL LOW (ref >60)
Glucose, Bld: 190 mg/dL — ABNORMAL HIGH (ref 70–99)
Potassium: 3.7 mmol/L (ref 3.5–5.1)
Sodium: 139 mmol/L (ref 135–145)

## 2020-06-27 LAB — CBC
HCT: 39.9 % (ref 39.0–52.0)
Hemoglobin: 12.2 g/dL — ABNORMAL LOW (ref 13.0–17.0)
MCH: 29.8 pg (ref 26.0–34.0)
MCHC: 30.6 g/dL (ref 30.0–36.0)
MCV: 97.6 fL (ref 80.0–100.0)
Platelets: 301 10*3/uL (ref 150–400)
RBC: 4.09 MIL/uL — ABNORMAL LOW (ref 4.22–5.81)
RDW: 19 % — ABNORMAL HIGH (ref 11.5–15.5)
WBC: 9.8 10*3/uL (ref 4.0–10.5)
nRBC: 0 % (ref 0.0–0.2)

## 2020-06-27 LAB — BRAIN NATRIURETIC PEPTIDE: B Natriuretic Peptide: 141.3 pg/mL — ABNORMAL HIGH (ref 0.0–100.0)

## 2020-06-27 NOTE — Progress Notes (Signed)
Advanced [facility] Clinic Note   Date:  06/27/2020   ID:  Marco [name], DOB 1955-10-12, MRN [id number]  Location: Home  Provider location: [facility] Type of Visit: Established patient  PCP:  [name], MD  Cardiologist:  [name], MD Primary HF: Wei Newbrough  Chief Complaint: Heart Failure follow-up   History of Present Illness:  Marco [name] is a 64 y.o. 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  He was seen by [name] in 03/2018 for SOB and found to have a drop in EF to 35-40% with inferior hypokinesis. Cardiac cath 05/19/18 showed mild nonobstructive CAD  LVEDP 28, EF 25-30%.   PYP 11/19  (grade 2, H/CLL equal 1.94) are strongly suggestive of transthyretin amyloidosis. However on my review, did not seem so positive. Myleloma panel with MGUS, discussed with [name] who recommend follow up labwork for common, non-pathologic findings. Did not think he had myeloma  Repeat PYP 12/20 H/CLL = 1.10 The study is equivocal for TTR amyloidosis (visual score of 1 or H/CL ratio 1-1.5).   Echo 2/20 EF 40-45% RV normal   Echo 3/21 EF 55-60%   Admitted 12/08/19-12/13/19 for urosepsis and AKI with peri-nephric abscess. Creatinine 1.5 -> 5.6. Meds held. Hydrated and creatinine 1.7 on d/c.   Readmitted 5/18-01/14/20 for pyelonephritis. Entresto stopped. Had drain drain placed.  CT 05/18/20 with large capsular hematoma of R kidney and hepatic abscess  Repeat echo 05/16/20 EF 25-30%  He presents for routine f/u. At last visit I  restarted Entresto. Had labs with PCP and creatinine back up 1.3 -> 3.1 so Entresto held and lasix cut back to 80 daily.  Says he is doing ok. Main issues are leg and back pain. No edema, orthopnea or PND. No CP. Breathing improved. Only SOB up steps or carrying things. Still struggling with kidney stoes and following with Urology to see if he needs lithotripsy vs ureteroscopy and stenting.    Studies:   [facility] 11/2015 showed minimal CAD, mild calcification noted extraluminally, normal LVF, normal LVEDP. CPX 06/2016 with limited interpretation due to submaximal effort during exercise, difficult to interpret, also of note was very intolerant to discomfort from several processes during the CPX procedure today (i.e.motion on bike, electrode removal, etc).  - Echo 09/03/16 EF 45-50% with global HK, grade 2 DD, elevated LV filling pressure. CTA 09/02/16 was negative for PE.  - Nuclear stress test 12/2016 was done for atypical CP showing small defect of severe severity in apical septal and apex location, no ischemia, EF 44% - suspected artifact.  - Echo 01/20/17 EF 50-55%, hypokinesis of apical inferoseptal myocardium, grade 1 diastolic dysfunction, mild MR, no sig change from prior.  - Event monitor 12/2016 showed NSR with occasional PVCs, otherwise benign.  - ETT 02/2017 was nondiagnostic due to 20 seconds of exercise and severe SOB. -Cardiac cath 05/19/18 showed mild nonobstructive CAD (see below), mod-severely elevated LVEDP 28, EF 25-30%. - Echo 05/19/18 LVEF 25-30%, Grade 1 DD, Trivial MR.  - PYP 11/19 Visual and quantitative assessment (grade 2, H/CLL equal 1.94) are strongly suggestive of transthyretin amyloidosis.  LHC 05/19/18  - Ost 2nd Diag lesion is 35% stenosed.  Prox LAD to Mid LAD lesion is 20% stenosed.  Ost Cx to Prox Cx lesion is 10% stenosed.  There is severe left ventricular systolic dysfunction.  LV end diastolic pressure is moderately elevated. The left ventricular ejection fraction  is 25-35% by visual estimate. - LVEDP 28  CPX 06/19/2016 - Limited due to submaximal effort and poor amount of information.  FVC 2.35 (65%)    FEV1 1.94 (66%)     FEV1/FVC 82 (102%)     MVV 117 (86%) Post-Exercise PFTs--------- Not performed due to sudden termination of exercise Exercise Time:  1:56      Watts: 10 RPE: 17 Resting HR: 70 Peak HR: 84  (50% age predicted max HR) BP rest: 148/82 BP peak: 156/84 Peak VO2: 9.7 (50% predicted peak VO2) VE/VCO2 slope: 30 OUES: 1.34 Peak RER: 0.97 Ventilatory Threshold: CANNOT BE DETERMINED Peak RR 78 Peak Ventilation: 28.8 VE/MVV: 25% PETCO2 at peak: 36 O2pulse: 11  (79% predicted O2pulse)   Marco [name] denies symptoms worrisome for COVID 19.   Past Medical History:  Diagnosis Date   Benign colon polyp 08/01/2013   [facility] in [place]. large base tranverse colon polyp was biopsied. polyp was benign with minimal surface hyperplastic change.   Chronic combined systolic and diastolic CHF (congestive heart failure) (HCC)    Chronic pain    CKD (chronic kidney disease), stage III (HCC)    Diabetes mellitus without complication ([place])    Diverticulosis    Esophageal hiatal hernia 07/29/2013   confirmed on EGD    Esophageal stricture    Essential hypertension    Gastritis 07/29/2013   confirmed on EGD, bx done an negative for intestinal metaplasia, dsyplasia or H. pylori. normal gastric emptying study done 07/13/2013.   GERD (gastroesophageal reflux disease)    Morbid obesity ([place])    Non-obstructive CAD    a. 02/2013 Cath ([place]): nonobs dzs;  b. 09/2014 Myoview ([place]): EF 55%, no ischemia;  c. Cath 05/2018 mild CAD no obstruction.   Persistent atrial fibrillation ([place])    a. 02/2013 s/p rfca in [place], [place]-->prev on Xarelto, d/c'd 2/2 anemia, ? GIB.   Rheumatoid arthritis ([place])    Sigmoid diverticulosis 08/01/2013   confirmed on colonscopy. record scanned into chart   Past  Surgical History:  Procedure Laterality Date   CARDIAC CATHETERIZATION  05/2014   ablation for atrial fibrillation   CARDIAC CATHETERIZATION N/A 11/16/2015   Procedure: Left Heart Cath and Coronary Angiography;  Surgeon: Leonie Man, MD;  Location: [place] [facility];  Service: Cardiovascular;  Laterality: N/A;   COLON RESECTION  09/2013   due to large, abnormal polpy. non cancerous per patient.    COLON SURGERY  09/2014   colon resection    IR PERC PLEURAL DRAIN W/INDWELL CATH W/IMG GUIDE  01/07/2020   LEFT HEART CATH AND CORONARY ANGIOGRAPHY N/A 05/19/2018   Procedure: LEFT HEART CATH AND CORONARY ANGIOGRAPHY;  Surgeon: Wellington Hampshire, MD;  Location: [place] [facility];  Service: Cardiovascular;  Laterality: N/A;     Current Outpatient Medications  Medication Sig Dispense Refill   Accu-Chek FastClix Lancets MISC 1 each by Other route 2 (two) times daily. E11.42 204 each 0   ACCU-CHEK GUIDE test strip USE 1 STRIP TWICE DAILY TO CHECK BLOOD GLUCOSE 100 strip 7   albuterol (PROVENTIL HFA;VENTOLIN HFA) 108 (90 Base) MCG/ACT inhaler Inhale 2 puffs into the lungs  every 6 (six) hours as needed for wheezing or shortness of breath. 1 Inhaler 0   allopurinol (ZYLOPRIM) 100 MG tablet Take 200 mg by mouth every evening.      aspirin (EQ ASPIRIN ADULT LOW DOSE) 81 MG EC tablet Take 1 tablet (81 mg total) by mouth daily. TAKE 1 TABLET BY MOUTH ONCE DAILY. SWALLOW WHOLE Please hold asa until you repeat blood work, resume if blood count stable and ok with you primary care doctor     atorvastatin (LIPITOR) 40 MG tablet Take 1 tablet (40 mg total) by mouth daily. 90 tablet 2   carvedilol (COREG) 12.5 MG tablet TAKE 1 TABLET BY MOUTH TWICE DAILY WITH A MEAL 180 tablet 1   diclofenac Sodium (VOLTAREN) 1 % GEL Apply 4 g topically 4 (four) times daily. (Patient taking differently: Apply 4 g topically 4 (four) times daily as needed (pain). ) 100 g 0   DULoxetine (CYMBALTA) 20 MG capsule TAKE 1  CAPSULE BY MOUTH ONCE DAILY (Patient taking differently: Take 20 mg by mouth daily. ) 90 capsule 0   famotidine (PEPCID) 20 MG tablet TAKE 1 TABLET BY MOUTH TWO TIMES DAILY (Patient taking differently: Take 20 mg by mouth 2 (two) times daily. ) 60 tablet 2   furosemide (LASIX) 80 MG tablet Take 80 mg by mouth daily.      Insulin Lispro Prot & Lispro (HUMALOG MIX 75/25 KWIKPEN) (75-25) 100 UNIT/ML Kwikpen Inject 60 Units into the skin daily with breakfast. Start with 30units daily, please check blood glucose three times a day and using sliding scale insulin if indicated per sliding scale, please see Dr Loanne Drilling in one week 15 mL    naloxone (NARCAN) nasal spray 4 mg/0.1 mL Place in nostril in the event of suspected overdose on Narcotic medication (Oxycodone) 1 each 0   oxyCODONE (OXY IR/ROXICODONE) 5 MG immediate release tablet Take 5 mg by mouth 3 (three) times daily as needed for moderate pain.      polyethylene glycol (MIRALAX / GLYCOLAX) 17 g packet Take 17 g by mouth daily as needed for moderate constipation or severe constipation. 14 each 0   Potassium Chloride ER 20 MEQ TBCR Take 20 mEq by mouth daily.      predniSONE (DELTASONE) 5 MG tablet Take 5 mg by mouth daily.     ramelteon (ROZEREM) 8 MG tablet TAKE 1 TABLET BY MOUTH AT BEDTIME (Patient taking differently: Take 8 mg by mouth at bedtime. ) 30 tablet 1   Tafamidis (VYNDAMAX) 61 MG CAPS Take 61 mg by mouth daily. 30 capsule 11   UNIFINE PENTIPS 32G X 4 MM MISC USE AS DIRECTED DAILY 100 each 0   No current facility-administered medications for this encounter.    Allergies:   Nsaids and Jardiance [empagliflozin]   Social History:  The patient  reports that he quit smoking about 12 years ago. His smoking use included cigarettes. He has a 5.00 pack-year smoking history. He has never used smokeless tobacco. He reports that he does not drink alcohol and does not use drugs.   Family History:  The patient's family history includes  COPD in his father and mother; Diabetes in his father and mother; Heart Problems in his father; Hypertension in his father and mother; Lung cancer in his mother.   ROS:  Please see the history of present illness.   All other systems are personally reviewed and negative.   Vitals:   06/27/20 1057  BP: 130/80  Pulse: 73  SpO2: 95%  Weight: 97 kg (213 lb 12.8 oz)    Exam:   General:  Well appearing. No resp difficulty HEENT: normal Neck: supple. no JVD. Carotids 2+ bilat; no bruits. No lymphadenopathy or thryomegaly appreciated. Cor: PMI nondisplaced. Regular rate & rhythm. No rubs, gallops or murmurs. Lungs: clear Abdomen: soft, nontender, nondistended. No hepatosplenomegaly. No bruits or masses. Good bowel sounds. Extremities: no cyanosis, clubbing, rash, edema Neuro: alert & orientedx3, cranial nerves grossly intact. moves all 4 extremities w/o difficulty. Affect pleasant   Recent Labs: 12/09/2019: TSH 1.102 01/01/2020: B Natriuretic Peptide 497.6 05/24/2020: Magnesium 1.8; Platelets 456 06/11/2020: ALT 17; Hemoglobin 10.2 06/18/2020: BUN 17; Creatinine, Ser 1.84; Potassium 3.8; Sodium 143  Personally reviewed   Wt Readings from Last 3 Encounters:  06/27/20 97 kg (213 lb 12.8 oz)  06/15/20 101.3 kg (223 lb 6.4 oz)  06/11/20 100.2 kg (221 lb)    NSR 71 Non-specific ST-T wave abnormalities. Personally reviewed    ASSESSMENT AND PLAN:  1. Chronic combined systolic and diastolic CHF - Echo 78/2/95 LVEF 25-30%, Grade 1 DD, Trivial MR.  - Echo 2/20 40-45%  RV ok. No need for ICD currently.  - Echo 3/21 EF 55-60% -> complete recovery - Echo 05/16/20 EF 25-30%  - Very mild non-obstructive CAD on cath 05/19/18,  NICM  - Improved NYHA II-III symptoms  (worse due to recent illnesses and recurrent LV dysfunction) - Volume status ok - perhaps a little low - PYP scan 11/19 read as + for Cardiac amyloidosis. But when I reviewed I am not 621% certain it is positive. Genetic testing  negative. Myeloma Panel with MGUS, no myeloma. Now on Tafamadis.  - Repeat PYP 1/21. Reviewed personally. Read as equivocal. I feel it is negative. - Ideally, would get cMRI, but limited due to claustrophobia.  - Continue lasix 80 daily - Off Entresto due to AKI and perinephric hematoma. Continue to hold - Continue coreg to 12.5 mg BID  - Off hydralazine/imdur due to low BP - Off Jardiance - EF back down in setting of recent illnesses. Hopefully will bounce back. Will not titrate HF meds at this point given recent sepsis and ongoing urologic and hepatic issues.  - Will see back in 1-2 months and continue to adjust  2. Cardiac Amyloidosis - By PYP scan 06/29/18. Grade 2, H/CLL equal 1.94. See discussion above - Genetic testing negative - Repeat PYP 1/21. Reviewed personally. Read as equivocal. I feel it is negative. - Has subsequently been started on tafamadis. May be able to stop - No changes 3. Nonobstructive CAD - By cath 05/2018 as above - No s/s angina - Continue ASA . Dr [name] managing lipids 4. CKD stage IIIb - Creatinine labile 1.3 -> 3.1 -> 1.8 - Treatment as above.  - Be careful not to overdiurese 5. History of atrial fibrillation - s/p RFCA 2017. No recurrence - Previously on Xarelto, stopped due to anemia and unknown blood loss. - CHA2DS2/VASc is at least 4. - Continue to hold for now with renal capsular hematoa 6. DM2 - Per PCP - Off Jardiance as he could not tolerate 7. R perinephric abscess 5/21 - s/p drainage - CT with large capsular hematoma - followed by Urology.    Signed, Glori Bickers, MD  06/27/2020 11:05 AM  [facility] [place] [id number] [phone] (office) [phone] (fax)

## 2020-06-27 NOTE — Patient Instructions (Signed)
Labs done today, your results will be available in MyChart, we will contact you for abnormal readings.  Your physician recommends that you schedule a follow-up appointment in: 4-6 weeks with an echocardiogram  Your physician has requested that you have an echocardiogram. Echocardiography is a painless test that uses sound waves to create images of your heart. It provides your doctor with information about the size and shape of your heart and how well your heart's chambers and valves are working. This procedure takes approximately one hour. There are no restrictions for this procedure.   If you have any questions or concerns before your next appointment please send [place] a message through [place] or call our office at [phone].    TO LEAVE A MESSAGE FOR THE NURSE SELECT OPTION 2, PLEASE LEAVE A MESSAGE INCLUDING: . YOUR NAME . DATE OF BIRTH . CALL BACK NUMBER . REASON FOR CALL**this is important as we prioritize the call backs  YOU WILL RECEIVE A CALL BACK THE SAME DAY AS LONG AS YOU CALL BEFORE 4:00 PM

## 2020-06-28 ENCOUNTER — Other Ambulatory Visit: Payer: Self-pay | Admitting: Internal Medicine

## 2020-06-28 ENCOUNTER — Other Ambulatory Visit: Payer: Self-pay | Admitting: Cardiology

## 2020-06-28 MED FILL — predniSONE 5 MG TABS: 5 | 30 days supply | Qty: 30 | Fill #1

## 2020-06-28 MED FILL — POTASSIUM CHLORIDE CRYS ER: 20 | 90 days supply | Qty: 90 | Fill #3

## 2020-06-28 MED FILL — DULOXETINE HCL 20 MG CPEP: 20 | 90 days supply | Qty: 90 | Fill #0

## 2020-06-28 MED FILL — ALLOPURINOL 100 MG TABS: 100 | 30 days supply | Qty: 60 | Fill #1

## 2020-06-28 MED FILL — FAMOTIDINE 20 MG TABLET: 20 | 30 days supply | Qty: 60 | Fill #2

## 2020-06-28 MED FILL — ATORVASTATIN 40 MG TABLET: 40 | 90 days supply | Qty: 90 | Fill #0

## 2020-07-09 ENCOUNTER — Other Ambulatory Visit: Payer: Self-pay | Admitting: Internal Medicine

## 2020-07-09 MED FILL — VYNDAMAX 61 MG CAPS: 61 | 30 days supply | Qty: 30 | Fill #9

## 2020-07-09 MED FILL — RAMELTEON 8 MG TABS: 8 | 30 days supply | Qty: 30 | Fill #0

## 2020-07-19 ENCOUNTER — Encounter: Payer: Self-pay | Admitting: Endocrinology

## 2020-07-19 ENCOUNTER — Ambulatory Visit (INDEPENDENT_AMBULATORY_CARE_PROVIDER_SITE_OTHER): Payer: Medicare Other | Admitting: Endocrinology

## 2020-07-19 ENCOUNTER — Other Ambulatory Visit: Payer: Self-pay

## 2020-07-19 VITALS — Ht 66.0 in | Wt 212.0 lb

## 2020-07-19 DIAGNOSIS — N183 Chronic kidney disease, stage 3 unspecified: Secondary | ICD-10-CM | POA: Diagnosis not present

## 2020-07-19 DIAGNOSIS — E1122 Type 2 diabetes mellitus with diabetic chronic kidney disease: Secondary | ICD-10-CM | POA: Diagnosis not present

## 2020-07-19 DIAGNOSIS — Z794 Long term (current) use of insulin: Secondary | ICD-10-CM

## 2020-07-19 DIAGNOSIS — E11649 Type 2 diabetes mellitus with hypoglycemia without coma: Secondary | ICD-10-CM

## 2020-07-19 LAB — POCT GLYCOSYLATED HEMOGLOBIN (HGB A1C): Hemoglobin A1C: 6 % — AB (ref 4.0–5.6)

## 2020-07-19 NOTE — Patient Instructions (Addendum)
check your blood sugar twice a day.  vary the time of day when you check, between before the 3 meals, and at bedtime.  also check if you have symptoms of your blood sugar being too high or too low.  please keep a record of the readings and bring it to your next appointment here (or you can bring the meter itself).  You can write it on any piece of paper.  please call us sooner if your blood sugar goes below 70, or if you have a lot of readings over 200.   A different type of diabetes blood test is requested for you today.  We'll let you know about the results.    If this confirms the A1c, we'll reduce the insulin to 50 units each morning. On this type of insulin schedule, you should eat meals on a regular schedule (especially lunch).  If a meal is missed or significantly delayed, your blood sugar could go low.    Please come back for a follow-up appointment in 2 months.

## 2020-07-19 NOTE — Progress Notes (Signed)
Subjective:    Patient ID: Marco [name], male    DOB: 1955-09-21, 64 y.o.   MRN: [id number]  HPI Pt returns for f/u of diabetes mellitus: DM type: Insulin-requiring type 2 Dx'ed: 1517 Complications: polyneuropathy, CAD, NPDR, and stage 3a CRI  Therapy: insulin since soon after dx DKA: never Severe hypoglycemia: once (2020) Pancreatitis: never.  Pancreatic imaging: never.  Other: he chronically takes prednisone, for psoriasis, with no plan for taper; he declined to continue multiple daily injections; due to renal failure (and fasting hypoglycemia), he takes 75/25 insulin.   Interval history: pt says cbg's vary from 120-200.  It is in general higher as the day goes on. pt says he never misses the insulin.  No change in chronic prednisone dosage (5 mg qd).   Past Medical History:  Diagnosis Date  . Benign colon polyp 08/01/2013   [facility] in [place]. large base tranverse colon polyp was biopsied. polyp was benign with minimal surface hyperplastic change.  . Chronic combined systolic and diastolic CHF (congestive heart failure) ([place])   . Chronic pain   . CKD (chronic kidney disease), stage III ([place])   . Diabetes mellitus without complication ([place])   . Diverticulosis   . Esophageal hiatal hernia 07/29/2013   confirmed on EGD   . Esophageal stricture   . Essential hypertension   . Gastritis 07/29/2013   confirmed on EGD, bx done an negative for intestinal metaplasia, dsyplasia or H. pylori. normal gastric emptying study done 07/13/2013.  [name] GERD (gastroesophageal reflux disease)   . Morbid obesity ([place])   . Non-obstructive CAD    a. 02/2013 Cath ([place]): nonobs dzs;  b. 09/2014 Myoview ([place]): EF 55%, no ischemia;  c. Cath 05/2018 mild CAD no obstruction.  . Persistent atrial fibrillation ([place])    a. 02/2013 s/p rfca in [place], [place]-->prev on Xarelto, d/c'd 2/2 anemia, ? GIB.  [name] Rheumatoid arthritis ([place])   . Sigmoid diverticulosis 08/01/2013   confirmed on colonscopy.  record scanned into chart    Past Surgical History:  Procedure Laterality Date  . CARDIAC CATHETERIZATION  05/2014   ablation for atrial fibrillation  . CARDIAC CATHETERIZATION N/A 11/16/2015   Procedure: Left Heart Cath and Coronary Angiography;  Surgeon: Leonie Man, MD;  Location: [facility];  Service: Cardiovascular;  Laterality: N/A;  . COLON RESECTION  09/2013   due to large, abnormal polpy. non cancerous per patient.   . COLON SURGERY  09/2014   colon resection   . IR PERC PLEURAL DRAIN W/INDWELL CATH W/IMG GUIDE  01/07/2020  . LEFT HEART CATH AND CORONARY ANGIOGRAPHY N/A 05/19/2018   Procedure: LEFT HEART CATH AND CORONARY ANGIOGRAPHY;  Surgeon: Wellington Hampshire, MD;  Location: [facility];  Service: Cardiovascular;  Laterality: N/A;    Social History   Socioeconomic History  . Marital status: Married    Spouse name: Amethyst  . Number of children: 8  . Years of education: 44  . Highest education level: Some college, no degree  Occupational History    Comment: disabled  Tobacco Use  . Smoking status: Former Smoker    Packs/day: 0.50    Years: 10.00    Pack years: 5.00    Types: Cigarettes    Quit date: 04/11/2008    Years since quitting: 12.2  . Smokeless tobacco: Never Used  Vaping Use  . Vaping Use: Never used  Substance and Sexual Activity  . Alcohol use: No  Alcohol/week: 0.0 standard drinks  . Drug use: No  . Sexual activity: Yes    Partners: Female    Birth control/protection: None  Other Topics Concern  . Not on file  Social History Narrative   Lives with wife. Does not work.  On disability.    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 Provided crisis assistance program information to assist with bills       Has issues getting food- gets $36/month in food  stamps- already has list of food pantries but has not tried them- encouraged pt to try food pantries and reach out to clinic for help if needed   Social Determinants of Health   Financial Resource Strain: Not on file  Food Insecurity: Not on file  Transportation Needs: Not on file  Physical Activity: Not on file  Stress: Not on file  Social Connections: Not on file  Intimate Partner Violence: Not on file    Current Outpatient Medications on File Prior to Visit  Medication Sig Dispense Refill  . Accu-Chek FastClix Lancets MISC 1 each by Other route 2 (two) times daily. E11.42 204 each 0  . ACCU-CHEK GUIDE test strip USE 1 STRIP TWICE DAILY TO CHECK BLOOD GLUCOSE 100 strip 7  . albuterol (PROVENTIL HFA;VENTOLIN HFA) 108 (90 Base) MCG/ACT inhaler Inhale 2 puffs into the lungs every 6 (six) hours as needed for wheezing or shortness of breath. 1 Inhaler 0  . allopurinol (ZYLOPRIM) 100 MG tablet Take 200 mg by mouth every evening.     [name] aspirin (EQ ASPIRIN ADULT LOW DOSE) 81 MG EC tablet Take 1 tablet (81 mg total) by mouth daily. TAKE 1 TABLET BY MOUTH ONCE DAILY. SWALLOW WHOLE Please hold asa until you repeat blood work, resume if blood count stable and ok with you primary care doctor    . atorvastatin (LIPITOR) 40 MG tablet Take 1 tablet (40 mg total) by mouth daily. Please make yearly appt with [name] for December before anymore refills. Thank you 1st attempt 90 tablet 0  . carvedilol (COREG) 12.5 MG tablet TAKE 1 TABLET BY MOUTH TWICE DAILY WITH A MEAL 180 tablet 1  . diclofenac Sodium (VOLTAREN) 1 % GEL Apply 4 g topically 4 (four) times daily. (Patient taking differently: Apply 4 g topically 4 (four) times daily as needed (pain).) 100 g 0  . DULoxetine (CYMBALTA) 20 MG capsule TAKE 1 CAPSULE BY MOUTH ONCE DAILY 90 capsule 0  . famotidine (PEPCID) 20 MG tablet TAKE 1 TABLET BY MOUTH TWO TIMES DAILY (Patient taking differently: Take 20 mg by mouth 2 (two) times daily.) 60 tablet 2  .  furosemide (LASIX) 80 MG tablet Take 80 mg by mouth daily.     . Insulin Lispro Prot & Lispro (HUMALOG MIX 75/25 KWIKPEN) (75-25) 100 UNIT/ML Kwikpen Inject 60 Units into the skin daily with breakfast. Start with 30units daily, please check blood glucose three times a day and using sliding scale insulin if indicated per sliding scale, please see Dr Loanne Drilling in one week 15 mL   . naloxone (NARCAN) nasal spray 4 mg/0.1 mL Place in nostril in the event of suspected overdose on Narcotic medication (Oxycodone) 1 each 0  . oxyCODONE (OXY IR/ROXICODONE) 5 MG immediate release tablet Take 5 mg by mouth 3 (three)  times daily as needed for moderate pain.     . polyethylene glycol (MIRALAX / GLYCOLAX) 17 g packet Take 17 g by mouth daily as needed for moderate constipation or severe constipation. 14 each 0  . Potassium Chloride ER 20 MEQ TBCR Take 20 mEq by mouth daily.     . predniSONE (DELTASONE) 5 MG tablet Take 5 mg by mouth daily.    . ramelteon (ROZEREM) 8 MG tablet TAKE 1 TABLET BY MOUTH AT BEDTIME 30 tablet 1  . Tafamidis (VYNDAMAX) 61 MG CAPS Take 61 mg by mouth daily. 30 capsule 11  . UNIFINE PENTIPS 32G X 4 MM MISC USE AS DIRECTED DAILY 100 each 0   No current facility-administered medications on file prior to visit.    Allergies  Allergen Reactions  . Nsaids Other (See Comments)    Stomach pains. Ulcers - stated by patient   . Jardiance [Empagliflozin] Nausea And Vomiting    Family History  Problem Relation Age of Onset  . Hypertension Mother   . Diabetes Mother   . COPD Mother   . Lung cancer Mother        Smoker   . Hypertension Father   . Diabetes Father   . Heart Problems Father   . COPD Father   . Colon cancer Neg Hx   . Stomach cancer Neg Hx     Ht 5\' 6"  (1.676 m)   Wt 212 lb (96.2 kg)   BMI 34.22 kg/m    Review of Systems He denies hypoglycemia.      Objective:   Physical Exam VITAL SIGNS:  See vs page GENERAL: no distress Pulses: dorsalis pedis intact bilat.    MSK: no deformity of the feet CV: no leg edema Skin:  no ulcer on the feet.  normal color and temp on the feet. Neuro: sensation is intact to touch on the feet Ext: bilat IGT, but no swell/tend/drainage/erythema.   Lab Results  Component Value Date   CREATININE 1.65 (H) 06/27/2020   BUN 15 06/27/2020   NA 139 06/27/2020   K 3.7 06/27/2020   CL 102 06/27/2020   CO2 27 06/27/2020   Lab Results  Component Value Date   HGBA1C 6.0 (A) 07/19/2020       Assessment & Plan:  Insulin-requiring type 2 DM, with stage 3 CRI Discordance between A1c and reported cbg's: check fructosamine.  Patient Instructions  check your blood sugar twice a day.  vary the time of day when you check, between before the 3 meals, and at bedtime.  also check if you have symptoms of your blood sugar being too high or too low.  please keep a record of the readings and bring it to your next appointment here (or you can bring the meter itself).  You can write it on any piece of paper.  please call us sooner if your blood sugar goes below 70, or if you have a lot of readings over 200.   A different type of diabetes blood test is requested for you today.  We'll let you know about the results.    If this confirms the A1c, we'll reduce the insulin to 50 units each morning. On this type of insulin schedule, you should eat meals on a regular schedule (especially lunch).  If a meal is missed or significantly delayed, your blood sugar could go low.    Please come back for a follow-up appointment in 2 months.

## 2020-07-24 LAB — FRUCTOSAMINE: Fructosamine: 285 umol/L (ref 205–285)

## 2020-07-25 ENCOUNTER — Encounter: Payer: Self-pay | Admitting: Podiatry

## 2020-07-25 ENCOUNTER — Other Ambulatory Visit: Payer: Self-pay

## 2020-07-25 ENCOUNTER — Ambulatory Visit (INDEPENDENT_AMBULATORY_CARE_PROVIDER_SITE_OTHER): Payer: Medicare Other | Admitting: Podiatry

## 2020-07-25 DIAGNOSIS — M79674 Pain in right toe(s): Secondary | ICD-10-CM

## 2020-07-25 DIAGNOSIS — E1142 Type 2 diabetes mellitus with diabetic polyneuropathy: Secondary | ICD-10-CM | POA: Diagnosis not present

## 2020-07-25 DIAGNOSIS — M79675 Pain in left toe(s): Secondary | ICD-10-CM

## 2020-07-25 DIAGNOSIS — B351 Tinea unguium: Secondary | ICD-10-CM | POA: Diagnosis not present

## 2020-07-25 NOTE — Progress Notes (Signed)
  Subjective:  Patient ID: Marco [name], male    DOB: 12/20/1955,  MRN: [id number]  Chief Complaint  Patient presents with  . diabetic foot care    Nail trim    64 y.o. male returns for the above complaint.  Patient presents with thickened elongated dystrophic toenails x10.  Patient was last seen by Dr. Cannon Kettle who debrided them.  Patient states not able to do it himself.  They are painful to touch.  He would like for me to debride them down.  He denies any other acute complaints.  He is a diabetic with last A1c of 6.0  Objective:  There were no vitals filed for this visit. Podiatric Exam: Vascular: dorsalis pedis and posterior tibial pulses are palpable bilateral. Capillary return is immediate. Temperature gradient is WNL. Skin turgor WNL  Sensorium: Normal Semmes Weinstein monofilament test. Normal tactile sensation bilaterally. Nail Exam: Pt has thick disfigured discolored nails with subungual debris noted bilateral entire nail hallux through fifth toenails.  Pain on palpation to the nails. Ulcer Exam: There is no evidence of ulcer or pre-ulcerative changes or infection. Orthopedic Exam: Muscle tone and strength are WNL. No limitations in general ROM. No crepitus or effusions noted. HAV  B/L.  Hammer toes 2-5  B/L. Skin: No Porokeratosis. No infection or ulcers    Assessment & Plan:   1. Type 2 diabetes mellitus with polyneuropathy (HCC)   2. Pain due to onychomycosis of toenails of both feet     Patient was evaluated and treated and all questions answered.  Onychomycosis with pain  -Nails palliatively debrided as below. -Educated on self-care  Procedure: Nail Debridement Rationale: pain  Type of Debridement: manual, sharp debridement. Instrumentation: Nail nipper, rotary burr. Number of Nails: 10  Procedures and Treatment: Consent by patient was obtained for treatment procedures. The patient understood the discussion of treatment and procedures well. All questions were  answered thoroughly reviewed. Debridement of mycotic and hypertrophic toenails, 1 through 5 bilateral and clearing of subungual debris. No ulceration, no infection noted.  Return Visit-Office Procedure: Patient instructed to return to the office for a follow up visit 3 months for continued evaluation and treatment.  Boneta Lucks, DPM    No follow-ups on file.

## 2020-07-27 ENCOUNTER — Other Ambulatory Visit: Payer: Self-pay | Admitting: Internal Medicine

## 2020-07-27 ENCOUNTER — Other Ambulatory Visit: Payer: Self-pay | Admitting: Endocrinology

## 2020-07-27 DIAGNOSIS — E11649 Type 2 diabetes mellitus with hypoglycemia without coma: Secondary | ICD-10-CM

## 2020-07-27 MED FILL — HUMALOG MIX 75-25 KWIKPEN: (75-25) 100 | 90 days supply | Qty: 144 | Fill #0

## 2020-07-27 MED FILL — FAMOTIDINE 20 MG TABLET: 20 | 30 days supply | Qty: 60 | Fill #0

## 2020-07-27 MED FILL — ACCU-CHEK GUIDE TEST STRIP: 50 days supply | Qty: 100 | Fill #3

## 2020-07-27 NOTE — Telephone Encounter (Signed)
Will forward to pcp

## 2020-07-27 NOTE — Telephone Encounter (Signed)
Dr. Wynetta Emery last rx was written on 04/29/18

## 2020-07-28 ENCOUNTER — Other Ambulatory Visit: Payer: Self-pay | Admitting: Internal Medicine

## 2020-07-28 MED ORDER — ALBUTEROL SULFATE HFA 108 (90 BASE) MCG/ACT IN AERS: 2.0000 | INHALATION_SPRAY | Freq: Four times a day (QID) | RESPIRATORY_TRACT | 1 refills | Status: DC | PRN

## 2020-07-30 ENCOUNTER — Telehealth (HOSPITAL_COMMUNITY): Payer: Self-pay | Admitting: Pharmacist

## 2020-07-30 MED FILL — predniSONE 5 MG TABS: 5 | 30 days supply | Qty: 30 | Fill #2

## 2020-07-30 MED FILL — ALBUTEROL SULFATE HFA 108 (: 108 (90 BAS | 25 days supply | Qty: 9 | Fill #0

## 2020-07-30 MED FILL — ALLOPURINOL 100 MG TABS: 100 | 30 days supply | Qty: 60 | Fill #2

## 2020-07-30 NOTE — Telephone Encounter (Signed)
Patient Advocate Encounter   Received notification from [employer] that prior authorization for Vyndamax is required.   PA submitted on CoverMyMeds Key [id number]  Status is pending   Will continue to follow.   Audry Riles, PharmD, BCPS, BCCP, CPP Heart Failure Clinic Pharmacist [phone]

## 2020-07-30 NOTE — Telephone Encounter (Signed)
Advanced Heart Failure Patient Advocate Encounter  Prior Authorization for Marco [name] has been approved.    PA# [id number] Effective dates: 08/11/20 through 08/10/21  Audry Riles, PharmD, BCPS, BCCP, CPP Heart Failure Clinic Pharmacist [phone]

## 2020-08-01 MED FILL — VYNDAMAX 61 MG CAPS: 61 | 30 days supply | Qty: 30 | Fill #10

## 2020-08-01 MED FILL — RAMELTEON 8 MG TABS: 8 | 30 days supply | Qty: 30 | Fill #1

## 2020-08-02 ENCOUNTER — Ambulatory Visit: Payer: Medicare Other | Attending: Internal Medicine | Admitting: Internal Medicine

## 2020-08-02 ENCOUNTER — Other Ambulatory Visit: Payer: Self-pay

## 2020-08-02 ENCOUNTER — Encounter: Payer: Self-pay | Admitting: Internal Medicine

## 2020-08-02 VITALS — BP 129/74 | HR 70 | Temp 98.7°F | Ht 66.0 in | Wt 216.0 lb

## 2020-08-02 DIAGNOSIS — R269 Unspecified abnormalities of gait and mobility: Secondary | ICD-10-CM | POA: Diagnosis not present

## 2020-08-02 DIAGNOSIS — N151 Renal and perinephric abscess: Secondary | ICD-10-CM

## 2020-08-02 DIAGNOSIS — Z794 Long term (current) use of insulin: Secondary | ICD-10-CM | POA: Diagnosis not present

## 2020-08-02 DIAGNOSIS — I5042 Chronic combined systolic (congestive) and diastolic (congestive) heart failure: Secondary | ICD-10-CM

## 2020-08-02 DIAGNOSIS — N1831 Chronic kidney disease, stage 3a: Secondary | ICD-10-CM | POA: Diagnosis not present

## 2020-08-02 DIAGNOSIS — I10 Essential (primary) hypertension: Secondary | ICD-10-CM | POA: Diagnosis not present

## 2020-08-02 DIAGNOSIS — E1122 Type 2 diabetes mellitus with diabetic chronic kidney disease: Secondary | ICD-10-CM | POA: Diagnosis not present

## 2020-08-02 LAB — GLUCOSE, POCT (MANUAL RESULT ENTRY): POC Glucose: 288 mg/dl — AB (ref 70–99)

## 2020-08-02 NOTE — Progress Notes (Signed)
Patient ID: Marco [name], male    DOB: 11-Dec-1955  MRN: [id number]  CC: Follow-up (Pt. Would like to discuss with PCP on getting booster shot, shingrix shot, and his referral to Infectious disease. )   Subjective: Marco [name] is a 64 y.o. male who presents for chronic ds management and 7 wks f/u His concerns today include:  history of perinephric abscess (12/2019 s/p drainage - E.coli), HTN,DM2 with neuropathy, and nephropathy, CKD stage III, obesity, nonobstructive CAD/cardiac amyloid, chronic combined CHFEF 55-60% in May of this yr and 25-30% 05/2020,PAF s/p ablation 2017 (Xarelto d/c due to recurrent anemia),OSA on VPAP, on Cosentyx and followed by rheumatology in [facility] for psoriasis/gout/nonspec arthritis, chronic insomnia. On narcotics through [facility] for chronic pain inchest/back/shoulders/neck.  On last visit, patient was seen post hospitalization for severe sepsis secondary to perinephritic abscess.  He has seen the urologist Dr. Claudia Desanctis in follow-up since last visit.  Per her note repeat imaging showed improvement of hepatic abscesses and continued presence of right perinephrotic hematoma without evidence of infection.  Patient's urinalysis was negative for infection and he was asymptomatic.  She plans to repeat CAT scan in 3 months to assess for interval change.  He was instructed to be seen in the emergency room if he develops fever, chills or altered mental status. For his ureteral calculus, she discussed options for the 4 mm stone that has been present for over a month.  Patient did not want ureteroscopy but may be interested in ESWL.  He will be scheduled to have a KUB done to see if it is visible. Patient states he has had the x-ray done recently and was told that everything looks okay and to follow-up again in February. -Since last visit, he denies any fever or flank pain.  Gait abnormality: Had been receiving home physical therapy.  He ambulates with his walker.   Denies any falls since last visit with me.  Reports more mental clarity.  He remains off the Cosentyx.  Has a follow-up appointment coming up with his rheumatologist.  HTN/CHF: Blood pressure today is good.  Reports compliance with his medications and low-salt diet.  Denies any chest pains.  Shortness of breath only if he exerts himself more than he should.  No PND orthopnea.  No lower extremity edema.  Weight is up 4 pounds by our scale today but patient thinks this is incorrect and likely due to layers of clothing.  He weighs himself several times a week at home and weight this morning at home was 212 pounds. -Saw his cardiologist [name] last month.  He continued to hold Entresto due to AKI.  Hydralazine, Imdur and Jardiance still on hold.  He was continued on carvedilol and Lasix 80 mg daily.  DM type II: He has seen [name] recently.  His A1c was 6 with fructosamine in the range to suggest A1c of 7.  Reports compliance with his insulin.  States that he is also doing better with his eating habits and eating smaller portion sizes.    Patient Active Problem List   Diagnosis Date Noted  . Healthcare maintenance 06/15/2020  . Perinephric abscess 05/13/2020  . Pyohydronephrosis 05/13/2020  . Vitamin B12 deficiency 02/19/2020  . Elevated LFTs   . Pleural effusion   . Weakness   . Loculated pleural effusion   . Complex renal cyst   . Urethral stricture 12/30/2019  . Nephrolithiasis 12/30/2019  . Acute febrile illness 12/30/2019  . Sepsis ([place]) 12/27/2019  .  Severe sepsis ([place]) 12/10/2019  . Acute metabolic encephalopathy 0000000  . Sepsis due to Gram negative bacteria ([place]) 12/09/2019  . Rhinitis 10/16/2019  . Gastroesophageal reflux disease without esophagitis 08/31/2018  . Muscle spasm 08/31/2018  . NICM (nonischemic cardiomyopathy) ([place]) 07/06/2018  . Hypokalemia   . Acute renal failure superimposed on stage 3 chronic kidney disease ([place]) 08/27/2017  . Hyperlipidemia  12/23/2016  . Chronic combined systolic and diastolic CHF (congestive heart failure) ([place]) 09/09/2016  . Acute respiratory failure with hypoxia ([place]) 09/02/2016  . Diabetic polyneuropathy associated with type 2 diabetes mellitus ([place]) 06/10/2016  . Vision changes 06/02/2016  . Myalgia and myositis 03/31/2016  . Shortness of breath 02/09/2016  . Chronic gouty arthritis 11/29/2015  . LBP (low back pain) 11/27/2015  . Arthralgia of multiple joints 11/27/2015  . 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[place]) 11/27/2015  . Breast pain in male 11/14/2015  . Sinusitis, chronic 10/16/2015  . Insomnia 10/16/2015  . New onset of headaches after age 61 09/20/2015  . OSA (obstructive sleep apnea) 07/12/2015  . Low serum testosterone level 05/18/2015  . Depression 05/16/2015  . Morbid obesity due to excess calories ([place])   . Chronic pain   . Type 2 diabetes mellitus with stage 3 chronic kidney disease, without long-term current use of insulin ([place])   . Rheumatoid arthritis ([place]) 02/05/2015  . Elevated troponin 12/24/2014  . CAD (coronary artery disease) 12/24/2014  . Atrial fibrillation ([place]) 12/24/2014  . Essential hypertension 12/24/2014  . Chronic kidney disease 12/24/2014  . PUD (peptic ulcer disease) 12/24/2014     Current Outpatient Medications on File Prior to Visit  Medication Sig Dispense Refill  . Accu-Chek FastClix Lancets MISC 1 each by Other route 2 (two) times daily. E11.42 204 each 0  . ACCU-CHEK GUIDE test strip USE 1 STRIP TWICE DAILY TO CHECK BLOOD GLUCOSE 100 strip 7  . albuterol (VENTOLIN HFA) 108 (90 Base) MCG/ACT inhaler Inhale 2 puffs into the lungs every 6 (six) hours as needed for wheezing or shortness of breath. 1 each 1  . allopurinol (ZYLOPRIM) 100 MG tablet Take 200 mg by mouth every evening.     [name] aspirin (EQ ASPIRIN ADULT LOW DOSE) 81 MG EC tablet Take 1 tablet (81 mg total) by mouth daily. TAKE 1 TABLET BY MOUTH ONCE DAILY. SWALLOW WHOLE Please hold asa until you repeat blood  work, resume if blood count stable and ok with you primary care doctor    . atorvastatin (LIPITOR) 40 MG tablet Take 1 tablet (40 mg total) by mouth daily. Please make yearly appt with [name] for December before anymore refills. Thank you 1st attempt 90 tablet 0  . carvedilol (COREG) 12.5 MG tablet TAKE 1 TABLET BY MOUTH TWICE DAILY WITH A MEAL 180 tablet 1  . diclofenac Sodium (VOLTAREN) 1 % GEL Apply 4 g topically 4 (four) times daily. (Patient taking differently: Apply 4 g topically 4 (four) times daily as needed (pain).) 100 g 0  . DULoxetine (CYMBALTA) 20 MG capsule TAKE 1 CAPSULE BY MOUTH ONCE DAILY 90 capsule 0  . famotidine (PEPCID) 20 MG tablet TAKE 1 TABLET BY MOUTH TWO TIMES DAILY 60 tablet 0  . furosemide (LASIX) 80 MG tablet Take 80 mg by mouth daily.     [name] HUMALOG MIX 75/25 KWIKPEN (75-25) 100 UNIT/ML Kwikpen INJECT 160 UNITS DAILY WITH BREAKFAST 144 mL 3  . naloxone (NARCAN) nasal spray 4 mg/0.1 mL Place in nostril in the event of suspected overdose on Narcotic medication (Oxycodone)  1 each 0  . oxyCODONE (OXY IR/ROXICODONE) 5 MG immediate release tablet Take 5 mg by mouth 3 (three) times daily as needed for moderate pain.     . polyethylene glycol (MIRALAX / GLYCOLAX) 17 g packet Take 17 g by mouth daily as needed for moderate constipation or severe constipation. 14 each 0  . Potassium Chloride ER 20 MEQ TBCR Take 20 mEq by mouth daily.     . predniSONE (DELTASONE) 5 MG tablet Take 5 mg by mouth daily.    . ramelteon (ROZEREM) 8 MG tablet TAKE 1 TABLET BY MOUTH AT BEDTIME 30 tablet 1  . Tafamidis (VYNDAMAX) 61 MG CAPS Take 61 mg by mouth daily. 30 capsule 11  . UNIFINE PENTIPS 32G X 4 MM MISC USE AS DIRECTED DAILY 100 each 0   No current facility-administered medications on file prior to visit.    Allergies  Allergen Reactions  . Nsaids Other (See Comments)    Stomach pains. Ulcers - stated by patient   . Jardiance [Empagliflozin] Nausea And Vomiting    Social History    Socioeconomic History  . Marital status: Married    Spouse name: Amethyst  . Number of children: 8  . Years of education: 58  . Highest education level: Some college, no degree  Occupational History    Comment: disabled  Tobacco Use  . Smoking status: Former Smoker    Packs/day: 0.50    Years: 10.00    Pack years: 5.00    Types: Cigarettes    Quit date: 04/11/2008    Years since quitting: 12.3  . Smokeless tobacco: Never Used  Vaping Use  . Vaping Use: Never used  Substance and Sexual Activity  . Alcohol use: No    Alcohol/week: 0.0 standard drinks  . Drug use: No  . Sexual activity: Yes    Partners: Female    Birth control/protection: None  Other Topics Concern  . Not on file  Social History Narrative   Lives with wife. Does not work.  On disability.    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 Provided crisis assistance program information to assist with bills       Has issues getting food- gets $36/month in food stamps- already has list of food pantries but has not tried them- encouraged pt to try food pantries and reach out to clinic for help if needed   Social Determinants of Health   Financial Resource Strain: Not on file  Food Insecurity: Not on file  Transportation Needs: Not on file  Physical Activity: Not on file  Stress: Not on file  Social Connections: Not on file  Intimate Partner Violence: Not on file    Family History  Problem Relation Age of Onset  . Hypertension Mother   . Diabetes Mother   . COPD Mother   . Lung cancer Mother        Smoker   . Hypertension Father   . Diabetes Father   . Heart Problems Father   . COPD Father   . Colon cancer Neg Hx   . Stomach cancer Neg Hx     Past Surgical History:  Procedure Laterality Date  . CARDIAC CATHETERIZATION   05/2014   ablation for atrial fibrillation  . CARDIAC CATHETERIZATION N/A 11/16/2015  Procedure: Left Heart Cath and Coronary Angiography;  Surgeon: Leonie Man, MD;  Location: [facility];  Service: Cardiovascular;  Laterality: N/A;  . COLON RESECTION  09/2013   due to large, abnormal polpy. non cancerous per patient.   . COLON SURGERY  09/2014   colon resection   . IR PERC PLEURAL DRAIN W/INDWELL CATH W/IMG GUIDE  01/07/2020  . LEFT HEART CATH AND CORONARY ANGIOGRAPHY N/A 05/19/2018   Procedure: LEFT HEART CATH AND CORONARY ANGIOGRAPHY;  Surgeon: Wellington Hampshire, MD;  Location: [facility];  Service: Cardiovascular;  Laterality: N/A;    ROS: Review of Systems Negative except as stated above  PHYSICAL EXAM: BP 129/74 (BP Location: Right Arm, Patient Position: Sitting, Cuff Size: Large)   Pulse 70   Temp 98.7 F (37.1 C) (Oral)   Ht 5\' 6"  (1.676 m)   Wt 216 lb (98 kg)   SpO2 94%   BMI 34.86 kg/m   Wt Readings from Last 3 Encounters:  08/02/20 216 lb (98 kg)  07/19/20 212 lb (96.2 kg)  06/27/20 213 lb 12.8 oz (97 kg)    Physical Exam  General appearance - alert, well appearing, and in no distress Mental status -patient is alert and answers questions appropriately.   Neck - supple, no significant adenopathy Chest - clear to auscultation, no wheezes, rales or rhonchi, symmetric air entry Heart - normal rate, regular rhythm, normal S1, S2, no murmurs, rubs, clicks or gallops Extremities -no lower extremity edema CMP Latest Ref Rng & Units 06/27/2020 06/18/2020 06/11/2020  Glucose 70 - 99 mg/dL 190(H) 108(H) 91  BUN 8 - 23 mg/dL 15 17 50(H)  Creatinine 0.61 - 1.24 mg/dL 1.65(H) 1.84(H) 3.06(H)  Sodium 135 - 145 mmol/L 139 143 140  Potassium 3.5 - 5.1 mmol/L 3.7 3.8 4.9  Chloride 98 - 111 mmol/L 102 103 99  CO2 22 - 32 mmol/L 27 28 28   Calcium 8.9 - 10.3 mg/dL 8.9 8.8 8.6  Total Protein 6.0 - 8.5 g/dL - - 8.0  Total Bilirubin 0.0 - 1.2 mg/dL - - 0.4   Alkaline Phos 44 - 121 IU/L - - 76  AST 0 - 40 IU/L - - 10  ALT 0 - 44 IU/L - - 17   Lipid Panel     Component Value Date/Time   CHOL 126 04/22/2019 1121   TRIG 148 01/03/2020 0444   HDL 32 (L) 04/22/2019 1121   CHOLHDL 3.9 04/22/2019 1121   CHOLHDL 2.6 06/10/2016 1253   VLDL 18 06/10/2016 1253   LDLCALC 70 04/22/2019 1121    CBC    Component Value Date/Time   WBC 9.8 06/27/2020 1129   RBC 4.09 (L) 06/27/2020 1129   HGB 12.2 (L) 06/27/2020 1129   HGB 10.2 (L) 06/11/2020 1145   HCT 39.9 06/27/2020 1129   HCT 33.2 (L) 06/11/2020 1145   PLT 301 06/27/2020 1129   PLT 357 02/16/2020 1218   MCV 97.6 06/27/2020 1129   MCV 92 06/11/2020 1145   MCH 29.8 06/27/2020 1129   MCHC 30.6 06/27/2020 1129   RDW 19.0 (H) 06/27/2020 1129   RDW 16.7 (H) 06/11/2020 1145   LYMPHSABS 1.9 06/11/2020 1145   MONOABS 2.3 (H) 05/18/2020 0304   EOSABS 0.3 06/11/2020 1145   BASOSABS 0.1 06/11/2020 1145    ASSESSMENT AND PLAN: 1. Type 2 diabetes mellitus with stage 3a chronic kidney disease, with long-term current use of insulin ([place]) At goal.  Encouraged him to continue to  be mindful of his eating habits especially during the holiday season.  Continue Humalog mix.  Followed by endocrinology - Glucose (CBG)  2. Essential hypertension At goal today on current medications including furosemide and carvedilol.  Other medications including Entresto, hydralazine and Imdur are on hold as patient was having low blood pressure  3. Gait abnormality No falls since last visit.  4. Perinephric abscess Being followed by urology  5. Chronic combined systolic and diastolic CHF, NYHA class 2 (HCC) Stable and compensated today.  Continue furosemide and carvedilol.  Followed by cardiology.  Patient had requested vaccine for shingles today but plans to get COVID booster vaccine within the next several days.  I recommend that he gets the COVID booster vaccine first and we can give the shingles vaccine 2 to 3  weeks later or on his next follow-up appointment.     Patient was given the opportunity to ask questions.  Patient verbalized understanding of the plan and was able to repeat key elements of the plan.   Orders Placed This Encounter  Procedures  . Glucose (CBG)     Requested Prescriptions    No prescriptions requested or ordered in this encounter    No follow-ups on file.  Karle Plumber, MD, FACP

## 2020-08-07 ENCOUNTER — Telehealth: Payer: Self-pay | Admitting: Internal Medicine

## 2020-08-07 NOTE — Telephone Encounter (Signed)
Copied from CRM [id number]. Topic: General - Inquiry >> Aug 07, 2020  2:30 PM Crist Infante wrote: Reason for CRM: pt states he left his application for SCAT last week.  Pt states he needs this back by tomorrow. Pt needs a call letting him know it is ready   Please follow up.

## 2020-08-08 ENCOUNTER — Telehealth: Payer: Self-pay | Admitting: Licensed Clinical Social Worker

## 2020-08-08 NOTE — Telephone Encounter (Signed)
Jasmine have you been able to complete scat form for pt

## 2020-08-08 NOTE — Telephone Encounter (Signed)
Call placed to patient. LCSW introduced self and explained role at [facility]   Pt was informed that SCAT application has been completed and was successfully faxed to [employer] [place] Eligibility Dept. Per pt request, LCSW also faxed pt a copy of the completed application to [phone].  Pt was appreciative for the assistance.

## 2020-08-08 NOTE — Telephone Encounter (Signed)
Patient called again in reference to his SCAT renewal it must be turned in by tomorrow. If not he will lose his SCAT and it doesn't want to do that. Please give him a call when he can come pick it up. He can be reached at [phone]. Please advise

## 2020-08-14 DIAGNOSIS — E1143 Type 2 diabetes mellitus with diabetic autonomic (poly)neuropathy: Secondary | ICD-10-CM | POA: Diagnosis not present

## 2020-08-14 DIAGNOSIS — M79662 Pain in left lower leg: Secondary | ICD-10-CM | POA: Diagnosis not present

## 2020-08-14 DIAGNOSIS — M25512 Pain in left shoulder: Secondary | ICD-10-CM | POA: Diagnosis not present

## 2020-08-14 DIAGNOSIS — G894 Chronic pain syndrome: Secondary | ICD-10-CM | POA: Diagnosis not present

## 2020-08-14 DIAGNOSIS — M545 Low back pain, unspecified: Secondary | ICD-10-CM | POA: Diagnosis not present

## 2020-08-14 DIAGNOSIS — M542 Cervicalgia: Secondary | ICD-10-CM | POA: Diagnosis not present

## 2020-08-14 DIAGNOSIS — M5412 Radiculopathy, cervical region: Secondary | ICD-10-CM | POA: Diagnosis not present

## 2020-08-14 DIAGNOSIS — G8929 Other chronic pain: Secondary | ICD-10-CM | POA: Diagnosis not present

## 2020-08-14 DIAGNOSIS — M25511 Pain in right shoulder: Secondary | ICD-10-CM | POA: Diagnosis not present

## 2020-08-14 DIAGNOSIS — Z79891 Long term (current) use of opiate analgesic: Secondary | ICD-10-CM | POA: Diagnosis not present

## 2020-08-14 DIAGNOSIS — M79661 Pain in right lower leg: Secondary | ICD-10-CM | POA: Diagnosis not present

## 2020-08-15 DIAGNOSIS — J9601 Acute respiratory failure with hypoxia: Secondary | ICD-10-CM | POA: Diagnosis not present

## 2020-08-16 ENCOUNTER — Other Ambulatory Visit: Payer: Self-pay | Admitting: Internal Medicine

## 2020-08-16 ENCOUNTER — Telehealth: Payer: Self-pay | Admitting: Internal Medicine

## 2020-08-16 MED ORDER — FLUTICASONE PROPIONATE 50 MCG/ACT NA SUSP: 1.0000 | Freq: Every day | NASAL | 1 refills | Status: DC | PRN

## 2020-08-16 NOTE — Telephone Encounter (Signed)
Returned pt call to see what questions he has regarding some medications.   Pt states he wants to know about using nasal decongestant. Pt states he is needing a rx. Pt states his nose is stuffed up, Pt states he has mucous in his nose. Pt would like rx sent tod [facility]

## 2020-08-16 NOTE — Telephone Encounter (Signed)
Copied from CRM [id number]. Topic: General - Other >> Aug 14, 2020 11:15 AM Jaquita Rector A wrote: Reason for CRM: Patient would like a call back have a question for Dr Laural Benes in regards to an OTC cold medication a nasal spray. Would like a call back with an answer please at  Ph# [phone]

## 2020-08-17 ENCOUNTER — Other Ambulatory Visit: Payer: Self-pay | Admitting: Endocrinology

## 2020-08-17 ENCOUNTER — Telehealth: Payer: Self-pay | Admitting: *Deleted

## 2020-08-17 DIAGNOSIS — E118 Type 2 diabetes mellitus with unspecified complications: Secondary | ICD-10-CM

## 2020-08-17 MED FILL — FLUTICASONE PROP 50 MCG SPR: 50 | 60 days supply | Qty: 16 | Fill #0

## 2020-08-17 NOTE — Telephone Encounter (Signed)
Contacted pt to go over Dr. Johnson message pt is aware and doesn't have any questions or concerns  

## 2020-08-17 NOTE — Telephone Encounter (Signed)
OK 

## 2020-08-19 NOTE — Progress Notes (Signed)
[facility] Note   Date:  08/19/2020   ID:  Marco [name], DOB June 02, 1956, MRN [id number]  Location: Home  Provider location: [facility] Type of Visit: Established patient  PCP:  [name], MD  Cardiologist:  [name], MD Primary HF: Chun Sellen  Chief Complaint: Heart Failure follow-up   History of Present Illness:  Marco [name] is a 65 y.o. 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  He was seen by [name] in 8/19 for SOB and found to have a drop in EF to 35-40% with inferior hypokinesis. Cardiac cath 10/19 showed mild nonobstructive CAD  LVEDP 28, EF 25-30%.   PYP 11/19  (grade 2, H/CLL equal 1.94) read as strongly suggestive TTR amyloidosis. However on my review, did not seem so positive. Myleloma panel with MGUS, discussed with [name] who recommend follow up labwork for common, non-pathologic findings. Did not think he had myeloma  Repeat PYP 12/20 H/CLL = 1.10 The study is equivocal for TTR amyloidosis (visual score of 1 or H/CL ratio 1-1.5).   Echo 2/20 EF 40-45% RV normal   Echo 3/21 EF 55-60%   Admitted 4/21 for urosepsis and AKI with peri-nephric abscess. Creatinine 1.5 -> 5.6. Meds held. Hydrated and creatinine 1.7 on d/c.   Readmitted 5/21 for pyelonephritis. Entresto stopped. Had drain placed.  CT 05/18/20 with large capsular hematoma of R kidney and hepatic abscess  Repeat echo 10/21 EF 25-30%  In 10/21, I restarted Entresto. Had labs with PCP and creatinine back up 1.3 -> 3.1 so Entresto held and lasix cut back to 80 daily.    Here for f/u. Says he is doing much better. Denies CP or SOB. Not very active due to weather and COVID. Mild tenderness around RUQ but no pain  otherwise. No hematuria. No f/c.   Echo today 08/21/20  EF ~ 45-50% RV ok. Personally reviewed   Studies:   LHC 11/2015 showed minimal CAD, mild calcification noted extraluminally, normal LVF, normal LVEDP. CPX 06/2016 with limited interpretation due to submaximal effort during exercise, difficult to interpret, also of note was very intolerant to discomfort from several processes during the CPX procedure today (i.e.motion on bike, electrode removal, etc).  - Echo 09/03/16 EF 45-50% with global HK, grade 2 DD, elevated LV filling pressure. CTA 09/02/16 was negative for PE.  - Nuclear stress test 12/2016 was done for atypical CP showing small defect of severe severity in apical septal and apex location, no ischemia, EF 44% - suspected artifact.  - Echo 01/20/17 EF 50-55%, hypokinesis of apical inferoseptal myocardium, grade 1 diastolic dysfunction, mild MR, no sig change from prior.  - Event monitor 12/2016 showed NSR with occasional PVCs, otherwise benign.  - ETT 02/2017 was nondiagnostic due to 20 seconds of exercise and severe SOB. -Cardiac cath 05/19/18 showed mild nonobstructive CAD (see below), mod-severely elevated LVEDP 28, EF 25-30%. - Echo 05/19/18 LVEF 25-30%, Grade 1 DD, Trivial MR.  - PYP 11/19 Visual and quantitative assessment (grade 2, H/CLL equal 1.94) are strongly suggestive of transthyretin amyloidosis.  LHC 05/19/18  - Ost 2nd Diag lesion is 35% stenosed.  Prox LAD to Mid LAD lesion is 20% stenosed.  Ost Cx to Prox Cx lesion is 10% stenosed.  There  is severe left ventricular systolic dysfunction.  LV end diastolic pressure is moderately elevated. The left ventricular ejection fraction is 25-35% by visual estimate. - LVEDP 28  CPX 06/19/2016 - Limited due to submaximal effort and poor amount of information.  FVC 2.35 (65%)    FEV1 1.94 (66%)     FEV1/FVC 82 (102%)     MVV 117 (86%) Post-Exercise PFTs--------- Not performed due to sudden termination of  exercise Exercise Time:  1:56      Watts: 10 RPE: 17 Resting HR: 70 Peak HR: 84  (50% age predicted max HR) BP rest: 148/82 BP peak: 156/84 Peak VO2: 9.7 (50% predicted peak VO2) VE/VCO2 slope: 30 OUES: 1.34 Peak RER: 0.97 Ventilatory Threshold: CANNOT BE DETERMINED Peak RR 78 Peak Ventilation: 28.8 VE/MVV: 25% PETCO2 at peak: 36 O2pulse: 11  (79% predicted O2pulse)   Marco [name] denies symptoms worrisome for COVID 19.   Past Medical History:  Diagnosis Date  . Benign colon polyp 08/01/2013   [facility] in [place]. large base tranverse colon polyp was biopsied. polyp was benign with minimal surface hyperplastic change.  . Chronic combined systolic and diastolic CHF (congestive heart failure) ([place])   . Chronic pain   . CKD (chronic kidney disease), stage III ([place])   . Diabetes mellitus without complication ([place])   . Diverticulosis   . Esophageal hiatal hernia 07/29/2013   confirmed on EGD   . Esophageal stricture   . Essential hypertension   . Gastritis 07/29/2013   confirmed on EGD, bx done an negative for intestinal metaplasia, dsyplasia or H. pylori. normal gastric emptying study done 07/13/2013.  [name] GERD (gastroesophageal reflux disease)   . Morbid obesity ([place])   . Non-obstructive CAD    a. 02/2013 Cath ([place]): nonobs dzs;  b. 09/2014 Myoview ([place]): EF 55%, no ischemia;  c. Cath 05/2018 mild CAD no obstruction.  . Persistent atrial fibrillation ([place])    a. 02/2013 s/p rfca in [place], [place]-->prev on Xarelto, d/c'd 2/2 anemia, ? GIB.  [name] Rheumatoid arthritis ([place])   . Sigmoid diverticulosis 08/01/2013   confirmed on colonscopy. record scanned into chart   Past Surgical History:  Procedure Laterality Date  . CARDIAC CATHETERIZATION  05/2014   ablation for atrial fibrillation  . CARDIAC CATHETERIZATION N/A 11/16/2015   Procedure: Left Heart Cath and Coronary Angiography;  Surgeon: Leonie Man, MD;  Location: [facility];  Service:  Cardiovascular;  Laterality: N/A;  . COLON RESECTION  09/2013   due to large, abnormal polpy. non cancerous per patient.   . COLON SURGERY  09/2014   colon resection   . IR PERC PLEURAL DRAIN W/INDWELL CATH W/IMG GUIDE  01/07/2020  . LEFT HEART CATH AND CORONARY ANGIOGRAPHY N/A 05/19/2018   Procedure: LEFT HEART CATH AND CORONARY ANGIOGRAPHY;  Surgeon: Wellington Hampshire, MD;  Location: [facility];  Service: Cardiovascular;  Laterality: N/A;     Current Outpatient Medications  Medication Sig Dispense Refill  . Accu-Chek FastClix Lancets MISC 1 each by Other route 2 (two) times daily. E11.42 204 each 0  . ACCU-CHEK GUIDE test strip USE 1 STRIP TWICE DAILY TO CHECK BLOOD GLUCOSE 100 strip 7  . albuterol (VENTOLIN HFA) 108 (90 Base) MCG/ACT inhaler Inhale 2 puffs into the lungs every 6 (six) hours as needed for wheezing or shortness of breath. 1 each 1  . allopurinol (ZYLOPRIM) 100 MG tablet Take 200 mg by mouth every evening.     [name]  aspirin (EQ ASPIRIN ADULT LOW DOSE) 81 MG EC tablet Take 1 tablet (81 mg total) by mouth daily. TAKE 1 TABLET BY MOUTH ONCE DAILY. SWALLOW WHOLE Please hold asa until you repeat blood work, resume if blood count stable and ok with you primary care doctor    . atorvastatin (LIPITOR) 40 MG tablet Take 1 tablet (40 mg total) by mouth daily. Please make yearly appt with [name] for December before anymore refills. Thank you 1st attempt 90 tablet 0  . carvedilol (COREG) 12.5 MG tablet TAKE 1 TABLET BY MOUTH TWICE DAILY WITH A MEAL 180 tablet 1  . diclofenac Sodium (VOLTAREN) 1 % GEL Apply 4 g topically 4 (four) times daily. (Patient taking differently: Apply 4 g topically 4 (four) times daily as needed (pain).) 100 g 0  . DULoxetine (CYMBALTA) 20 MG capsule TAKE 1 CAPSULE BY MOUTH ONCE DAILY 90 capsule 0  . famotidine (PEPCID) 20 MG tablet TAKE 1 TABLET BY MOUTH TWO TIMES DAILY 60 tablet 0  . fluticasone (FLONASE) 50 MCG/ACT nasal spray Place 1 spray into both  nostrils daily as needed for allergies or rhinitis. 16 g 1  . furosemide (LASIX) 80 MG tablet Take 80 mg by mouth daily.     [name] HUMALOG MIX 75/25 KWIKPEN (75-25) 100 UNIT/ML Kwikpen INJECT 160 UNITS DAILY WITH BREAKFAST 144 mL 3  . naloxone (NARCAN) nasal spray 4 mg/0.1 mL Place in nostril in the event of suspected overdose on Narcotic medication (Oxycodone) 1 each 0  . oxyCODONE (OXY IR/ROXICODONE) 5 MG immediate release tablet Take 5 mg by mouth 3 (three) times daily as needed for moderate pain.     . polyethylene glycol (MIRALAX / GLYCOLAX) 17 g packet Take 17 g by mouth daily as needed for moderate constipation or severe constipation. 14 each 0  . Potassium Chloride ER 20 MEQ TBCR Take 20 mEq by mouth daily.     . predniSONE (DELTASONE) 5 MG tablet Take 5 mg by mouth daily.    . ramelteon (ROZEREM) 8 MG tablet TAKE 1 TABLET BY MOUTH AT BEDTIME 30 tablet 1  . Tafamidis (VYNDAMAX) 61 MG CAPS Take 61 mg by mouth daily. 30 capsule 11  . UNIFINE PENTIPS 32G X 4 MM MISC USE AS DIRECTED DAILY 100 each 0   No current facility-administered medications for this encounter.    Allergies:   Nsaids and Jardiance [empagliflozin]   Social History:  The patient  reports that he quit smoking about 12 years ago. His smoking use included cigarettes. He has a 5.00 pack-year smoking history. He has never used smokeless tobacco. He reports that he does not drink alcohol and does not use drugs.   Family History:  The patient's family history includes COPD in his father and mother; Diabetes in his father and mother; Heart Problems in his father; Hypertension in his father and mother; Lung cancer in his mother.   ROS:  Please see the history of present illness.   All other systems are personally reviewed and negative.   Vitals:   08/21/20 1048  BP: (!) 150/88  Pulse: 65  SpO2: 96%  Weight: 98.4 kg (217 lb)    Exam:   General:  Well appearing. No resp difficulty HEENT: normal Neck: supple. no JVD. Carotids  2+ bilat; no bruits. No lymphadenopathy or thryomegaly appreciated. Cor: PMI nondisplaced. Regular rate & rhythm. No rubs, gallops or murmurs. Lungs: clear Abdomen: obese soft, nontender, nondistended. No hepatosplenomegaly. No bruits or masses. Good bowel sounds.  Extremities: no cyanosis, clubbing, rash, edema Neuro: alert & orientedx3, cranial nerves grossly intact. moves all 4 extremities w/o difficulty. Affect pleasant   Recent Labs: 12/09/2019: TSH 1.102 05/24/2020: Magnesium 1.8 06/11/2020: ALT 17 06/27/2020: B Natriuretic Peptide 141.3; BUN 15; Creatinine, Ser 1.65; Hemoglobin 12.2; Platelets 301; Potassium 3.7; Sodium 139  Personally reviewed   Wt Readings from Last 3 Encounters:  08/02/20 98 kg (216 lb)  07/19/20 96.2 kg (212 lb)  06/27/20 97 kg (213 lb 12.8 oz)     ASSESSMENT AND PLAN:  1. Chronic combined systolic and diastolic CHF - Echo 27/7/82 LVEF 25-30%, Grade 1 DD, Trivial MR.  - Echo 2/20 40-45%  RV ok. No need for ICD currently.  - Echo 3/21 EF 55-60% -> complete recovery - Echo 05/16/20 EF 25-30%  - Echo today 08/22/19 EF 45-50% RV normal. EF was back down in setting of recent illnesses.Now recovering  - Very mild non-obstructive CAD on cath 05/19/18,  NICM  - Improved NYHA II symptoms  - Volume status ok - perhaps a little low - PYP scan 11/19 read as + for Cardiac amyloidosis. But when I reviewed I am not 423% certain it is positive. Genetic testing negative. Myeloma Panel with MGUS, no myeloma. Now on Tafamadis.  - Repeat PYP 1/21. Reviewed personally. Read as equivocal. I feel it is negative. - Ideally, would get cMRI, but limited due to claustrophobia.  - Continue lasix 80 daily - Off Entresto due to AKI and perinephric hematoma. Continue to hold - BP is up so will restart hydralazine 25 tid and Imdur 30 daily for now. As renal function stabilizes can add back Entresto as he tolerates - Continue coreg to 12.5 mg BID  - Off hydralazine/imdur due to low BP -  Off Jardiance 2. Cardiac Amyloidosis - By PYP scan 06/29/18. Grade 2, H/CLL equal 1.94. See discussion above - Genetic testing negative - Repeat PYP 1/21. Reviewed personally. Read as equivocal. I feel it is negative. - Has subsequently been started on tafamadis. May be able to stop - No change today 3. Nonobstructive CAD - By cath 05/2018 as above - No s/s angina - Continue ASA . Dr [name] managing lipids 4. CKD stage IIIb - Creatinine labile 1.3 -> 3.1 -> 1.8 - Treatment as above.  - Recheck today 5. History of atrial fibrillation - s/p RFCA 2017. No recurrence - Previously on Xarelto, stopped due to anemia and unknown blood loss. - CHA2DS2/VASc is at least 4. - Continue to hold for now with renal capsular hematoma. Hopefully can restart after he sees Urology next month.  6. DM2 - Per PCP - Off Jardiance as he could not tolerate 7. R perinephric abscess 5/21 - s/p drainage - CT with large capsular hematoma - followed by Urology. He has f/u in February   Signed, Glori Bickers, MD  08/19/2020 10:21 PM  [facility] [address] [facility] [place] [address] [phone] (office) [phone] (fax)

## 2020-08-21 ENCOUNTER — Encounter (HOSPITAL_COMMUNITY): Payer: Self-pay | Admitting: Internal Medicine

## 2020-08-21 ENCOUNTER — Ambulatory Visit (HOSPITAL_COMMUNITY)
Admission: RE | Admit: 2020-08-21 | Discharge: 2020-08-21 | Disposition: A | Discharge status: Home / Self Care | Payer: Medicare Other | Source: Ambulatory Visit | Attending: Internal Medicine | Admitting: Internal Medicine

## 2020-08-21 ENCOUNTER — Other Ambulatory Visit (HOSPITAL_COMMUNITY): Payer: Self-pay | Admitting: Internal Medicine

## 2020-08-21 ENCOUNTER — Other Ambulatory Visit: Payer: Self-pay

## 2020-08-21 ENCOUNTER — Ambulatory Visit (HOSPITAL_BASED_OUTPATIENT_CLINIC_OR_DEPARTMENT_OTHER)
Admission: RE | Admit: 2020-08-21 | Discharge: 2020-08-21 | Disposition: A | Discharge status: Home / Self Care | Payer: Medicare Other | Source: Ambulatory Visit | Attending: Internal Medicine | Admitting: Internal Medicine

## 2020-08-21 VITALS — BP 150/88 | HR 65 | Wt 217.0 lb

## 2020-08-21 DIAGNOSIS — Z79899 Other long term (current) drug therapy: Secondary | ICD-10-CM | POA: Diagnosis not present

## 2020-08-21 DIAGNOSIS — Z794 Long term (current) use of insulin: Secondary | ICD-10-CM | POA: Insufficient documentation

## 2020-08-21 DIAGNOSIS — I5042 Chronic combined systolic (congestive) and diastolic (congestive) heart failure: Secondary | ICD-10-CM | POA: Diagnosis not present

## 2020-08-21 DIAGNOSIS — I5022 Chronic systolic (congestive) heart failure: Secondary | ICD-10-CM | POA: Diagnosis not present

## 2020-08-21 DIAGNOSIS — K750 Abscess of liver: Secondary | ICD-10-CM | POA: Diagnosis not present

## 2020-08-21 DIAGNOSIS — I10 Essential (primary) hypertension: Secondary | ICD-10-CM | POA: Diagnosis not present

## 2020-08-21 DIAGNOSIS — I4819 Other persistent atrial fibrillation: Secondary | ICD-10-CM | POA: Diagnosis not present

## 2020-08-21 DIAGNOSIS — I2510 Atherosclerotic heart disease of native coronary artery without angina pectoris: Secondary | ICD-10-CM | POA: Insufficient documentation

## 2020-08-21 DIAGNOSIS — I428 Other cardiomyopathies: Secondary | ICD-10-CM | POA: Diagnosis not present

## 2020-08-21 DIAGNOSIS — I130 Hypertensive heart and chronic kidney disease with heart failure and stage 1 through stage 4 chronic kidney disease, or unspecified chronic kidney disease: Secondary | ICD-10-CM | POA: Insufficient documentation

## 2020-08-21 DIAGNOSIS — F40240 Claustrophobia: Secondary | ICD-10-CM | POA: Insufficient documentation

## 2020-08-21 DIAGNOSIS — E854 Organ-limited amyloidosis: Secondary | ICD-10-CM | POA: Diagnosis not present

## 2020-08-21 DIAGNOSIS — E1122 Type 2 diabetes mellitus with diabetic chronic kidney disease: Secondary | ICD-10-CM | POA: Insufficient documentation

## 2020-08-21 DIAGNOSIS — Z87891 Personal history of nicotine dependence: Secondary | ICD-10-CM | POA: Insufficient documentation

## 2020-08-21 DIAGNOSIS — I43 Cardiomyopathy in diseases classified elsewhere: Secondary | ICD-10-CM | POA: Diagnosis not present

## 2020-08-21 DIAGNOSIS — Z7901 Long term (current) use of anticoagulants: Secondary | ICD-10-CM | POA: Diagnosis not present

## 2020-08-21 DIAGNOSIS — Z7982 Long term (current) use of aspirin: Secondary | ICD-10-CM | POA: Insufficient documentation

## 2020-08-21 DIAGNOSIS — N183 Chronic kidney disease, stage 3 unspecified: Secondary | ICD-10-CM | POA: Diagnosis not present

## 2020-08-21 DIAGNOSIS — Z8249 Family history of ischemic heart disease and other diseases of the circulatory system: Secondary | ICD-10-CM | POA: Diagnosis not present

## 2020-08-21 DIAGNOSIS — N1832 Chronic kidney disease, stage 3b: Secondary | ICD-10-CM | POA: Insufficient documentation

## 2020-08-21 LAB — COMPREHENSIVE METABOLIC PANEL
ALT: 24 U/L (ref 0–44)
AST: 14 U/L — ABNORMAL LOW (ref 15–41)
Albumin: 3.3 g/dL — ABNORMAL LOW (ref 3.5–5.0)
Alkaline Phosphatase: 57 U/L (ref 38–126)
Anion gap: 12 (ref 5–15)
BUN: 18 mg/dL (ref 8–23)
CO2: 25 mmol/L (ref 22–32)
Calcium: 8.9 mg/dL (ref 8.9–10.3)
Chloride: 101 mmol/L (ref 98–111)
Creatinine, Ser: 1.53 mg/dL — ABNORMAL HIGH (ref 0.61–1.24)
GFR, Estimated: 50 mL/min — ABNORMAL LOW (ref >60)
Glucose, Bld: 158 mg/dL — ABNORMAL HIGH (ref 70–99)
Potassium: 3.5 mmol/L (ref 3.5–5.1)
Sodium: 138 mmol/L (ref 135–145)
Total Bilirubin: 0.6 mg/dL (ref 0.3–1.2)
Total Protein: 7.2 g/dL (ref 6.5–8.1)

## 2020-08-21 LAB — CBC
HCT: 40 % (ref 39.0–52.0)
Hemoglobin: 13.4 g/dL (ref 13.0–17.0)
MCH: 31.6 pg (ref 26.0–34.0)
MCHC: 33.5 g/dL (ref 30.0–36.0)
MCV: 94.3 fL (ref 80.0–100.0)
Platelets: 232 10*3/uL (ref 150–400)
RBC: 4.24 MIL/uL (ref 4.22–5.81)
RDW: 15.1 % (ref 11.5–15.5)
WBC: 10.1 10*3/uL (ref 4.0–10.5)
nRBC: 0 % (ref 0.0–0.2)

## 2020-08-21 LAB — BRAIN NATRIURETIC PEPTIDE: B Natriuretic Peptide: 106.4 pg/mL — ABNORMAL HIGH (ref 0.0–100.0)

## 2020-08-21 MED ORDER — ISOSORBIDE MONONITRATE ER 30 MG PO TB24: 30.0000 mg | ORAL_TABLET | Freq: Every day | ORAL | 3 refills | Status: DC

## 2020-08-21 MED ORDER — HYDRALAZINE HCL 25 MG PO TABS: 25.0000 mg | ORAL_TABLET | Freq: Three times a day (TID) | ORAL | 3 refills | Status: DC

## 2020-08-21 MED FILL — HYDRALAZINE HCL 25 MG TABS: 25 | 30 days supply | Qty: 90 | Fill #0

## 2020-08-21 MED FILL — ISOSORBIDE MN ER 30 MG TAB: 30 | 30 days supply | Qty: 30 | Fill #0

## 2020-08-21 NOTE — Progress Notes (Signed)
  Echocardiogram 2D Echocardiogram has been performed.  Marco [name] 08/21/2020, 10:40 AM

## 2020-08-21 NOTE — Patient Instructions (Addendum)
Start Hydralazine 25 mg Three times a day   Start Imdur 30 mg Daily  Labs done today, your results will be available in MyChart, we will contact you for abnormal readings.  Your physician recommends that you schedule a follow-up appointment in: 3 months  If you have any questions or concerns before your next appointment please send [place] a message through [place] or call our office at [phone].    TO LEAVE A MESSAGE FOR THE NURSE SELECT OPTION 2, PLEASE LEAVE A MESSAGE INCLUDING: . YOUR NAME . DATE OF BIRTH . CALL BACK NUMBER . REASON FOR CALL**this is important as we prioritize the call backs  [place] AS LONG AS YOU CALL BEFORE 4:00 PM  At the [facility], you and your health needs are our priority. As part of our continuing mission to provide you with exceptional heart care, we have created designated Provider Care Teams. These Care Teams include your primary Cardiologist (physician) and Advanced Practice Providers (APPs- Physician Assistants and Nurse Practitioners) who all work together to provide you with the care you need, when you need it.   You may see any of the following providers on your designated Care Team at your next follow up: [name] Dr Glori Bickers . Dr Loralie Champagne . Darrick Grinder, NP . Lyda Jester, PA . Audry Riles, PharmD   Please be sure to bring in all your medications bottles to every appointment.

## 2020-08-21 NOTE — Addendum Note (Signed)
Encounter addended by: Scarlette Calico, RN on: 08/21/2020 11:23 AM  Actions taken: Diagnosis association updated, Order list changed, Clinical Note Signed, Charge Capture section accepted

## 2020-08-22 ENCOUNTER — Other Ambulatory Visit (HOSPITAL_COMMUNITY): Payer: Self-pay

## 2020-08-22 ENCOUNTER — Other Ambulatory Visit (HOSPITAL_COMMUNITY): Payer: Self-pay | Admitting: Internal Medicine

## 2020-08-22 ENCOUNTER — Telehealth: Payer: Self-pay | Admitting: Endocrinology

## 2020-08-22 DIAGNOSIS — L409 Psoriasis, unspecified: Secondary | ICD-10-CM | POA: Diagnosis not present

## 2020-08-22 DIAGNOSIS — G629 Polyneuropathy, unspecified: Secondary | ICD-10-CM | POA: Diagnosis not present

## 2020-08-22 DIAGNOSIS — M2550 Pain in unspecified joint: Secondary | ICD-10-CM | POA: Diagnosis not present

## 2020-08-22 DIAGNOSIS — M1A09X0 Idiopathic chronic gout, multiple sites, without tophus (tophi): Secondary | ICD-10-CM | POA: Diagnosis not present

## 2020-08-22 DIAGNOSIS — Z79899 Other long term (current) drug therapy: Secondary | ICD-10-CM | POA: Diagnosis not present

## 2020-08-22 DIAGNOSIS — Z794 Long term (current) use of insulin: Secondary | ICD-10-CM

## 2020-08-22 LAB — ECHOCARDIOGRAM COMPLETE
Area-P 1/2: 3.65 cm2
S' Lateral: 4 cm
Single Plane A4C EF: 54 %

## 2020-08-22 MED ORDER — FUROSEMIDE 80 MG PO TABS: 80.0000 mg | ORAL_TABLET | Freq: Every day | ORAL | 3 refills | Status: DC

## 2020-08-22 MED FILL — COSENTYX 150 MG/ML PEN INJE: 150 | 28 days supply | Qty: 1 | Fill #0

## 2020-08-22 MED FILL — FUROSEMIDE 80 MG TAB: 80 | 90 days supply | Qty: 90 | Fill #0

## 2020-08-22 NOTE — Telephone Encounter (Signed)
Patient asked if we could call in his accu-chek test strips but instead of it saying he tests once a day it needs to say twice a day.  Pharmacy:  [place], [place] - [address]  [place], [place] [address]  Phone:  [phone] Fax:  [phone]

## 2020-08-22 NOTE — Telephone Encounter (Signed)
Patient calling asking if his strips have been called in yet - I informed him I had only sent the message an hour ago and they haven't gotten to it yet but I told him I would send back a message to let them know he is calling to check on that. FYI

## 2020-08-22 NOTE — Telephone Encounter (Signed)
Meds ordered this encounter  Medications  . furosemide (LASIX) 80 MG tablet    Sig: Take 1 tablet (80 mg total) by mouth daily.    Dispense:  90 tablet    Refill:  3

## 2020-08-23 ENCOUNTER — Other Ambulatory Visit: Payer: Self-pay | Admitting: Endocrinology

## 2020-08-23 MED ORDER — ACCU-CHEK GUIDE VI STRP: ORAL_STRIP | 3 refills | Status: DC

## 2020-08-23 NOTE — Telephone Encounter (Signed)
Pt contacted and confirmed times per day that he tests. Pt stated that he tests 3-4 times per day. I did mention that insurance may not pay to test that many times per day. I did send in the rx for 4xday testing with 3 dx codes related to his diabetes in an effort to get it tested.   Pt will let us know if he has any issue picking up the rx.

## 2020-08-27 ENCOUNTER — Ambulatory Visit: Payer: Medicare Other | Admitting: Internal Medicine

## 2020-08-27 MED FILL — VYNDAMAX 61 MG CAPS: 61 | 30 days supply | Qty: 30 | Fill #11

## 2020-08-28 DIAGNOSIS — G4733 Obstructive sleep apnea (adult) (pediatric): Secondary | ICD-10-CM | POA: Diagnosis not present

## 2020-08-28 NOTE — Progress Notes (Signed)
HPI male former smoker followed for OSA/ Insomnia, dyspnea on exertion, complicated by chronic pain, anxiety, DM 2, history cardiac ablation, gout NPSG 37.8/hour on 06/14/2015, desaturation to 76%, body weight 223 pounds BiPAP titration 08/19/2015- to 20/16 Barium Swallow 02/07/2016-normal CXR 01/22/2016-NAD PFT 02/22/2016-minimal restriction, mild diffusion deficit. FVC 2.62/76%, FEV1 2.14/81%, ratio 0.82, DLCO 61%, TLC 79%, RV/TLC 135%. 6MWT- 02/22/16-97%, 98%, 98%. Stopped after only 110 m complaining of chest tightness, headache, shortness of breath, dizziness. BP and heart rate were unremarkable. Unattended Home Sleep Test-05/25/2016-AHI 36.9/hour, desaturation to 82%, body weight 212 pounds   ----------------------------------------------------------------------------  02/02/20- 65 year old male former smoker followed for OSA/ Insomnia, dyspnea on exertion, complicated by chronic pain, anxiety, DM 2/retinopathy, history cardiac ablation, gout/arthritis, CHF/ CM, CKD, HBP, GERD, psoriasis, gout, Obesity,  VPAP auto 14/ 11, PS2/ [facility] 5/18-01/14/20 for pyelonephritis with sepsis Ambien 6.25 CR, Albuterol hfa, Download compliance 53%( was in hosp), AHI 6.8/ hr Body weight today- 229 lbs Say s pressure doesn't "feel right". Has f/u w [facility] about pleural effusion. Covax- 2 Phizer CXR 01/12/20- 1V-  IMPRESSION: 1. Persistent right lung base opacity and small right pleural effusion, unchanged from the prior chest radiograph. 2. Resolved left basilar atelectasis.  No new lung abnormalities. 3. Stable mild cardiomegaly.  08/30/20- 65 year old male former smoker followed for OSA/ Insomnia, dyspnea on exertion, complicated by chronic pain, anxiety, DM 2/retinopathy, history cardiac ablation, gout/arthritis, CHF/ CM, CKD, HBP, GERD, psoriasis, Gout, Obesity,  -Ventolin hfa, Flonase, Rozerem 8 mg,  VPAP auto Max IPAP14/  Min EPAP 11, PS2/ [facility]       AirCurve 10 V Auto>> today change to  12/9 Download- compliance 100%, AHI 3.3/ hr         Body weight today- 220 lbs Covid vax- 3 Phizer Flu vax- Had cardiology f/ulast week w latest EF 45-50%. Last summer had urinary sepsis, pyelo, renal capsular hematoma, hepatic abscess. Reports occasional trace heme in phlegm with morning cough. He thinks his full-face mask dries out his nose to cause a little epistaxis. Continues BASA. CXR 06/01/20- 1V- IMPRESSION: Atelectatic change right minor fissure region. Lungs otherwise clear. Stable cardiac silhouette. Calcification noted in each carotid artery.  ROS-see HPI    "+" = positive Constitutional:    weight loss, night sweats, fevers, chills, fatigue, lassitude. HEENT:    headaches, + difficulty swallowing, tooth/dental problems, sore throat,       sneezing, itching, ear ache, nasal congestion, post nasal drip, snoring CV:     chest pain, orthopnea, PND, + swelling in lower extremities, anasarca,                                                          dizziness, palpitations Resp:  + shortness of breath with exertion or at rest.                productive cough,  + non-productive cough,+? coughing up of blood.              change in color of mucus.  wheezing.   Skin:    rash or lesions. GI:  No-   heartburn, indigestion, + abdominal pain, nausea, vomiting, diarrhea,                 change in bowel habits, loss of appetite GU: dysuria, change in  color of urine, no urgency or frequency.   flank pain. MS:   + joint pain, stiffness, decreased range of motion, back pain. Neuro-     nothing unusual Psych:  change in mood or affect.  depression or anxiety.   memory loss.  OBJ- Physical Exam General- Alert, Oriented, Affect-appropriate, Distress- none acute, + obese,  Skin- rash-none, lesions- none, excoriation- none Lymphadenopathy- none Head- atraumatic            Eyes- Gross vision intact, PERRLA, conjunctivae and secretions clear            Ears- Hearing, canals-normal             Nose- Clear, no-Septal dev, mucus, polyps, erosion, perforation             Throat- Mallampati IV , mucosa clear-not particularly dry , drainage- none, tonsils- atrophic,                  +many missing teeth Neck- flexible , trachea midline, no stridor , thyroid nl, carotid no bruit Chest - symmetrical excursion , unlabored           Heart/CV- RRR , no murmur , no gallop  , no rub, nl s1 s2                           - JVD- none , edema- none, stasis changes- none, varices- none           Lung- clear to P&A, wheeze- none, cough- none , dullness-none, rub- none           Chest wall-  Abd-  Br/ Gen/ Rectal- Not done, not indicated Extrem- cyanosis- none, clubbing, none, atrophy- none, strength- nl Neuro- grossly intact to observation

## 2020-08-29 MED FILL — ACCU-CHEK GUIDE TEST STRIP: 75 days supply | Qty: 300 | Fill #0

## 2020-08-30 ENCOUNTER — Other Ambulatory Visit: Payer: Self-pay

## 2020-08-30 ENCOUNTER — Encounter: Payer: Self-pay | Admitting: Internal Medicine

## 2020-08-30 ENCOUNTER — Ambulatory Visit (INDEPENDENT_AMBULATORY_CARE_PROVIDER_SITE_OTHER): Payer: Medicare Other | Admitting: Internal Medicine

## 2020-08-30 ENCOUNTER — Ambulatory Visit (INDEPENDENT_AMBULATORY_CARE_PROVIDER_SITE_OTHER): Payer: Medicare Other

## 2020-08-30 VITALS — BP 114/72 | HR 73 | Temp 98.3°F | Ht 66.0 in | Wt 220.0 lb

## 2020-08-30 DIAGNOSIS — R042 Hemoptysis: Secondary | ICD-10-CM | POA: Diagnosis not present

## 2020-08-30 DIAGNOSIS — G4733 Obstructive sleep apnea (adult) (pediatric): Secondary | ICD-10-CM | POA: Diagnosis not present

## 2020-08-30 DIAGNOSIS — R059 Cough, unspecified: Secondary | ICD-10-CM | POA: Diagnosis not present

## 2020-08-30 NOTE — Patient Instructions (Signed)
Order- CXR- dx hemoptysis  Order- DME [facility]- please change VPAP range to 12/9, PS 2  Order- referral to Sleep center for mask fitting/ desensitization  Try otc nasal saline gel (store brand, AYR, NeilMed, etc) as needed for dry nose.  Please call If we can help

## 2020-09-03 ENCOUNTER — Other Ambulatory Visit: Payer: Self-pay | Admitting: Internal Medicine

## 2020-09-03 MED FILL — FAMOTIDINE 20 MG TABLET: 20 | 30 days supply | Qty: 60 | Fill #0

## 2020-09-03 MED FILL — RAMELTEON 8 MG TABS: 8 | 30 days supply | Qty: 30 | Fill #0

## 2020-09-03 MED FILL — ALLOPURINOL 100 MG TABS: 100 | 30 days supply | Qty: 60 | Fill #3

## 2020-09-03 NOTE — Progress Notes (Signed)
LMTCB

## 2020-09-05 ENCOUNTER — Other Ambulatory Visit: Payer: Self-pay

## 2020-09-05 ENCOUNTER — Ambulatory Visit (HOSPITAL_BASED_OUTPATIENT_CLINIC_OR_DEPARTMENT_OTHER): Payer: Medicare Other | Attending: Internal Medicine | Admitting: Internal Medicine

## 2020-09-05 DIAGNOSIS — G4733 Obstructive sleep apnea (adult) (pediatric): Secondary | ICD-10-CM

## 2020-09-06 ENCOUNTER — Telehealth: Payer: Self-pay | Admitting: Internal Medicine

## 2020-09-06 NOTE — Telephone Encounter (Signed)
Deneise Lever, MD  09/03/2020 9:55 AM EST      CXR- stable. Chronic bronchitis changes with nothing new.    Attempted to call pt but unable to reach. Left message for him to return call.

## 2020-09-07 NOTE — Telephone Encounter (Signed)
Spoke with pt, aware of results/recs.  Nothing further needed.  

## 2020-09-07 NOTE — Telephone Encounter (Signed)
Patient is returning phone call. Patient phone number is [phone]. 

## 2020-09-11 DIAGNOSIS — E1143 Type 2 diabetes mellitus with diabetic autonomic (poly)neuropathy: Secondary | ICD-10-CM | POA: Diagnosis not present

## 2020-09-11 DIAGNOSIS — Z79891 Long term (current) use of opiate analgesic: Secondary | ICD-10-CM | POA: Diagnosis not present

## 2020-09-11 DIAGNOSIS — M5412 Radiculopathy, cervical region: Secondary | ICD-10-CM | POA: Diagnosis not present

## 2020-09-11 DIAGNOSIS — M79661 Pain in right lower leg: Secondary | ICD-10-CM | POA: Diagnosis not present

## 2020-09-11 DIAGNOSIS — M545 Low back pain, unspecified: Secondary | ICD-10-CM | POA: Diagnosis not present

## 2020-09-11 DIAGNOSIS — M25511 Pain in right shoulder: Secondary | ICD-10-CM | POA: Diagnosis not present

## 2020-09-11 DIAGNOSIS — M25512 Pain in left shoulder: Secondary | ICD-10-CM | POA: Diagnosis not present

## 2020-09-11 DIAGNOSIS — M542 Cervicalgia: Secondary | ICD-10-CM | POA: Diagnosis not present

## 2020-09-11 DIAGNOSIS — G8929 Other chronic pain: Secondary | ICD-10-CM | POA: Diagnosis not present

## 2020-09-11 DIAGNOSIS — G894 Chronic pain syndrome: Secondary | ICD-10-CM | POA: Diagnosis not present

## 2020-09-11 DIAGNOSIS — M79662 Pain in left lower leg: Secondary | ICD-10-CM | POA: Diagnosis not present

## 2020-09-12 ENCOUNTER — Other Ambulatory Visit (HOSPITAL_COMMUNITY): Payer: Self-pay | Admitting: Internal Medicine

## 2020-09-12 ENCOUNTER — Other Ambulatory Visit (HOSPITAL_COMMUNITY): Payer: Medicare Other

## 2020-09-12 ENCOUNTER — Encounter (HOSPITAL_COMMUNITY): Payer: Medicare Other | Admitting: Internal Medicine

## 2020-09-12 ENCOUNTER — Telehealth (HOSPITAL_COMMUNITY): Payer: Self-pay | Admitting: *Deleted

## 2020-09-12 NOTE — Telephone Encounter (Signed)
Agree with stopping. Let me know if not improving

## 2020-09-12 NOTE — Telephone Encounter (Signed)
I received a message from Audry Riles (pharmD) see below and please advise.   I was hoping you could triage it to [name]: I'm at the speciality call center today and was doing a clinical call on this patient. He said he recently started hydral and isosorbide, since then has been having extreme SOB and chest pain (has had to use oxygen). He said he just stopped taking it yesterday because he felt so bad.

## 2020-09-12 NOTE — Telephone Encounter (Signed)
Called pt back to advise. No answer/no vm set up.

## 2020-09-13 ENCOUNTER — Encounter: Payer: Self-pay | Admitting: *Deleted

## 2020-09-14 ENCOUNTER — Telehealth: Payer: Self-pay

## 2020-09-14 NOTE — Telephone Encounter (Signed)
Left detailed VM.  

## 2020-09-14 NOTE — Telephone Encounter (Signed)
Call placed to [facility], spoke to [place] who stated that the patient has been assessed and is already receiving PCS

## 2020-09-15 DIAGNOSIS — J9601 Acute respiratory failure with hypoxia: Secondary | ICD-10-CM | POA: Diagnosis not present

## 2020-09-17 ENCOUNTER — Other Ambulatory Visit (HOSPITAL_BASED_OUTPATIENT_CLINIC_OR_DEPARTMENT_OTHER): Payer: Medicare Other | Admitting: Internal Medicine

## 2020-09-17 DIAGNOSIS — N200 Calculus of kidney: Secondary | ICD-10-CM | POA: Diagnosis not present

## 2020-09-17 DIAGNOSIS — N151 Renal and perinephric abscess: Secondary | ICD-10-CM | POA: Diagnosis not present

## 2020-09-17 DIAGNOSIS — K7689 Other specified diseases of liver: Secondary | ICD-10-CM | POA: Diagnosis not present

## 2020-09-17 DIAGNOSIS — N201 Calculus of ureter: Secondary | ICD-10-CM | POA: Diagnosis not present

## 2020-09-18 ENCOUNTER — Encounter: Payer: Self-pay | Admitting: Internal Medicine

## 2020-09-18 DIAGNOSIS — N151 Renal and perinephric abscess: Secondary | ICD-10-CM | POA: Diagnosis not present

## 2020-09-18 DIAGNOSIS — N201 Calculus of ureter: Secondary | ICD-10-CM | POA: Diagnosis not present

## 2020-09-19 MED FILL — COSENTYX 150 MG/ML PEN INJE: 150 | 28 days supply | Qty: 1 | Fill #1

## 2020-09-19 MED FILL — VYNDAMAX 61 MG CAPS: 61 | 30 days supply | Qty: 30 | Fill #0

## 2020-09-20 ENCOUNTER — Ambulatory Visit: Payer: Medicare Other | Admitting: Endocrinology

## 2020-09-20 ENCOUNTER — Other Ambulatory Visit: Payer: Self-pay

## 2020-09-24 ENCOUNTER — Telehealth (INDEPENDENT_AMBULATORY_CARE_PROVIDER_SITE_OTHER): Payer: Medicare Other | Admitting: Endocrinology

## 2020-09-24 ENCOUNTER — Other Ambulatory Visit: Payer: Self-pay | Admitting: Endocrinology

## 2020-09-24 ENCOUNTER — Other Ambulatory Visit: Payer: Self-pay

## 2020-09-24 DIAGNOSIS — Z794 Long term (current) use of insulin: Secondary | ICD-10-CM | POA: Diagnosis not present

## 2020-09-24 DIAGNOSIS — E11649 Type 2 diabetes mellitus with hypoglycemia without coma: Secondary | ICD-10-CM

## 2020-09-24 MED ORDER — INSULIN LISPRO PROT & LISPRO (75-25 MIX) 100 UNIT/ML KWIKPEN
70.0000 [IU] | PEN_INJECTOR | Freq: Every day | SUBCUTANEOUS | 3 refills | Status: DC
Start: 2020-09-24 — End: 2020-10-31

## 2020-09-24 MED ORDER — FREESTYLE LIBRE 2 READER DEVI: 1.0000 | Freq: Once | 1 refills | Status: DC

## 2020-09-24 MED ORDER — FREESTYLE LIBRE 2 SENSOR MISC: 1.0000 | 3 refills | Status: DC

## 2020-09-24 NOTE — Patient Instructions (Addendum)
check your blood sugar twice a day.  vary the time of day when you check, between before the 3 meals, and at bedtime.  also check if you have symptoms of your blood sugar being too high or too low.  please keep a record of the readings and bring it to your next appointment here (or you can bring the meter itself).  You can write it on any piece of paper.  please call us sooner if your blood sugar goes below 70, or if you have a lot of readings over 200.   Please increase the insulin to 70 units each morning. On this type of insulin schedule, you should eat meals on a regular schedule (especially lunch).  If a meal is missed or significantly delayed, your blood sugar could go low. I have sent a prescription to your pharmacy, for the continuous glucose monitor.  Please call if you need help using.     Please come back for a follow-up appointment in 2 months.

## 2020-09-24 NOTE — Progress Notes (Signed)
Subjective:    Patient ID: Marco [name], male    DOB: Sep 17, 1955, 65 y.o.   MRN: [id number]  HPI  telehealth visit today via video visit.   Alternatives to telehealth are presented to this patient, and the patient agrees to the telehealth visit.   Pt is advised of the cost of the visit, and agrees to this, also.   Patient is at home, and I am at home.   Persons attending the telehealth visit: the patient and I.   Pt returns for f/u of diabetes mellitus: DM type: Insulin-requiring type 2 Dx'ed: 1448 Complications: polyneuropathy, CAD, NPDR, and stage 3a CRI  Therapy: insulin since soon after dx DKA: never Severe hypoglycemia: once (2020) Pancreatitis: never.  Pancreatic imaging: never.  Other: he intermittently takes prednisone, for psoriasis; he declined to continue multiple daily injections; due to renal failure (and fasting hypoglycemia), he takes 75/25 insulin.   Interval history: pt says cbg's vary from 140-300.  It is in general higher as the day goes on. pt says he never misses the insulin.  He is off prednisone.  He says insulin is 60 units qam, not 160.  Confirmed by readback.   Past Medical History:  Diagnosis Date   Benign colon polyp 08/01/2013   [facility] in [place]. large base tranverse colon polyp was biopsied. polyp was benign with minimal surface hyperplastic change.   Chronic combined systolic and diastolic CHF (congestive heart failure) (HCC)    Chronic pain    CKD (chronic kidney disease), stage III (HCC)    Diabetes mellitus without complication ([place])    Diverticulosis    Esophageal hiatal hernia 07/29/2013   confirmed on EGD    Esophageal stricture    Essential hypertension    Gastritis 07/29/2013   confirmed on EGD, bx done an negative for intestinal metaplasia, dsyplasia or H. pylori. normal gastric emptying study done 07/13/2013.   GERD (gastroesophageal reflux disease)    Morbid obesity ([place])    Non-obstructive CAD    a.  02/2013 Cath ([facility]): nonobs dzs;  b. 09/2014 Myoview ([facility]): EF 55%, no ischemia;  c. Cath 05/2018 mild CAD no obstruction.   Persistent atrial fibrillation ([place])    a. 02/2013 s/p rfca in [place], [place]-->prev on Xarelto, d/c'd 2/2 anemia, ? GIB.   Rheumatoid arthritis ([place])    Sigmoid diverticulosis 08/01/2013   confirmed on colonscopy. record scanned into chart    Past Surgical History:  Procedure Laterality Date   CARDIAC CATHETERIZATION  05/2014   ablation for atrial fibrillation   CARDIAC CATHETERIZATION N/A 11/16/2015   Procedure: Left Heart Cath and Coronary Angiography;  Surgeon: Leonie Man, MD;  Location: [facility];  Service: Cardiovascular;  Laterality: N/A;   COLON RESECTION  09/2013   due to large, abnormal polpy. non cancerous per patient.    COLON SURGERY  09/2014   colon resection    IR PERC PLEURAL DRAIN W/INDWELL CATH W/IMG GUIDE  01/07/2020   LEFT HEART CATH AND CORONARY ANGIOGRAPHY N/A 05/19/2018   Procedure: LEFT HEART CATH AND CORONARY ANGIOGRAPHY;  Surgeon: Wellington Hampshire, MD;  Location: [facility];  Service: Cardiovascular;  Laterality: N/A;    Social History   Socioeconomic History   Marital status: Married    Spouse name: Marco [name]   Number of children: 8   Years of education: 13   Highest education level: Some college, no degree  Occupational History    Comment: disabled  Tobacco  Use   Smoking status: Former Smoker    Packs/day: 0.50    Years: 10.00    Pack years: 5.00    Types: Cigarettes    Quit date: 04/11/2008    Years since quitting: 12.4   Smokeless tobacco: Never Used  Vaping Use   Vaping Use: Never used  Substance and Sexual Activity   Alcohol use: No    Alcohol/week: 0.0 standard drinks   Drug use: No   Sexual activity: Yes    Partners: Female    Birth control/protection: None  Other Topics Concern   Not on file  Social History Narrative   Lives with wife. Does not work.  On disability.     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 Provided crisis assistance program information to assist with bills       Has issues getting food- gets $36/month in food stamps- already has list of food pantries but has not tried them- encouraged pt to try food pantries and reach out to clinic for help if needed   Social Determinants of Health   Financial Resource Strain: Not on file  Food Insecurity: Not on file  Transportation Needs: Not on file  Physical Activity: Not on file  Stress: Not on file  Social Connections: Not on file  Intimate Partner Violence: Not on file    Current Outpatient Medications on File Prior to Visit  Medication Sig Dispense Refill   Accu-Chek FastClix Lancets MISC 1 each by Other route 2 (two) times daily. E11.42 204 each 0   albuterol (VENTOLIN HFA) 108 (90 Base) MCG/ACT inhaler Inhale 2 puffs into the lungs every 6 (six) hours as needed for wheezing or shortness of breath. 1 each 1   allopurinol (ZYLOPRIM) 100 MG tablet Take 200 mg by mouth every evening.      aspirin (EQ ASPIRIN ADULT LOW DOSE) 81 MG EC tablet Take 1 tablet (81 mg total) by mouth daily. TAKE 1 TABLET BY MOUTH ONCE DAILY. SWALLOW WHOLE Please hold asa until you repeat blood work, resume if blood count stable and ok with you primary care doctor     atorvastatin (LIPITOR) 40 MG tablet Take 1 tablet (40 mg total) by mouth daily. Please make yearly appt with [name] for December before anymore refills. Thank you 1st attempt 90 tablet 0   carvedilol (COREG) 12.5 MG tablet TAKE 1 TABLET BY MOUTH TWICE DAILY WITH A MEAL 180 tablet 1   COSENTYX SENSOREADY PEN 150 MG/ML SOAJ SMARTSIG:1 Pre-Filled Pen Syringe SUB-Q Every 4 Weeks     diclofenac Sodium (VOLTAREN) 1 % GEL Apply 4 g topically 4 (four) times daily. (Patient  taking differently: Apply 4 g topically 4 (four) times daily as needed (pain).) 100 g 0   DULoxetine (CYMBALTA) 20 MG capsule TAKE 1 CAPSULE BY MOUTH ONCE DAILY 90 capsule 0   famotidine (PEPCID) 20 MG tablet TAKE 1 TABLET BY MOUTH TWO TIMES DAILY 60 tablet 0   fluticasone (FLONASE) 50 MCG/ACT nasal spray Place 1 spray into both nostrils daily as needed for allergies or rhinitis. 16 g 1   furosemide (LASIX) 80 MG tablet Take 1 tablet (80 mg total) by mouth daily. 90 tablet 3   glucose blood (ACCU-CHEK GUIDE) test strip Use to test 4 times daily. [place]  strip 3   hydrALAZINE (APRESOLINE) 25 MG tablet Take 1 tablet (25 mg total) by mouth 3 (three) times daily. 90 tablet 3   isosorbide mononitrate (IMDUR) 30 MG 24 hr tablet Take 1 tablet (30 mg total) by mouth daily. 30 tablet 3   naloxone (NARCAN) nasal spray 4 mg/0.1 mL Place in nostril in the event of suspected overdose on Narcotic medication (Oxycodone) 1 each 0   oxyCODONE (OXY IR/ROXICODONE) 5 MG immediate release tablet Take 5 mg by mouth 3 (three) times daily as needed for moderate pain.      polyethylene glycol (MIRALAX / GLYCOLAX) 17 g packet Take 17 g by mouth daily as needed for moderate constipation or severe constipation. 14 each 0   Potassium Chloride ER 20 MEQ TBCR Take 20 mEq by mouth daily.      ramelteon (ROZEREM) 8 MG tablet TAKE 1 TABLET BY MOUTH AT BEDTIME 30 tablet 1   UNIFINE PENTIPS 32G X 4 MM MISC USE AS DIRECTED DAILY 100 each 0   VYNDAMAX 61 MG CAPS TAKE 1 CAPSULE (61 MG) BY MOUTH DAILY. 30 capsule 11   No current facility-administered medications on file prior to visit.    Allergies  Allergen Reactions   Nsaids Other (See Comments)    Stomach pains. Ulcers - stated by patient    Jardiance [Empagliflozin] Nausea And Vomiting    Family History  Problem Relation Age of Onset   Hypertension Mother    Diabetes Mother    COPD Mother    Lung cancer Mother        Smoker    Hypertension Father     Diabetes Father    Heart Problems Father    COPD Father    Colon cancer Neg Hx    Stomach cancer Neg Hx     There were no vitals taken for this visit.   Review of Systems Pt reports pain at the fingertips, due to PN.  He requests continuous glucose monitor.     Objective:   Physical Exam      Assessment & Plan:  Insulin-requiring type 2 DM, with CRI: Uncontrolled.    Patient Instructions  check your blood sugar twice a day.  vary the time of day when you check, between before the 3 meals, and at bedtime.  also check if you have symptoms of your blood sugar being too high or too low.  please keep a record of the readings and bring it to your next appointment here (or you can bring the meter itself).  You can write it on any piece of paper.  please call us sooner if your blood sugar goes below 70, or if you have a lot of readings over 200.   Please increase the insulin to 70 units each morning. On this type of insulin schedule, you should eat meals on a regular schedule (especially lunch).  If a meal is missed or significantly delayed, your blood sugar could go low. I have sent a prescription to your pharmacy, for the continuous glucose monitor.  Please call if you need help using.     Please come back for a follow-up appointment in 2 months.

## 2020-09-26 ENCOUNTER — Ambulatory Visit (INDEPENDENT_AMBULATORY_CARE_PROVIDER_SITE_OTHER): Payer: Medicare Other | Admitting: Podiatry

## 2020-09-26 ENCOUNTER — Encounter: Payer: Self-pay | Admitting: Internal Medicine

## 2020-09-26 ENCOUNTER — Other Ambulatory Visit: Payer: Self-pay

## 2020-09-26 ENCOUNTER — Encounter: Payer: Self-pay | Admitting: Podiatry

## 2020-09-26 DIAGNOSIS — M79675 Pain in left toe(s): Secondary | ICD-10-CM | POA: Diagnosis not present

## 2020-09-26 DIAGNOSIS — L853 Xerosis cutis: Secondary | ICD-10-CM

## 2020-09-26 DIAGNOSIS — B351 Tinea unguium: Secondary | ICD-10-CM | POA: Diagnosis not present

## 2020-09-26 DIAGNOSIS — M79674 Pain in right toe(s): Secondary | ICD-10-CM | POA: Diagnosis not present

## 2020-09-26 DIAGNOSIS — E1142 Type 2 diabetes mellitus with diabetic polyneuropathy: Secondary | ICD-10-CM

## 2020-09-26 NOTE — Progress Notes (Signed)
Patient seen 09/18/2020 and follow-up.  There is mention that repeat CAT scan of the abdomen pelvis shows almost complete resolution of right perinephritic abscess.  There are still some small areas of the superior and lateral region of the kidney that are still healing.  Patient also still has 4 mm mid left ureteral calculus.  It is not bothersome to him but has been present for more than 6 months.  Options discussed include observation, ESWL and ureteroscopy with laser lithotripsy.  Patient wanted to proceed with ESWL.  This will be scheduled.  For his erectile dysfunction he was prescribed sildenafil 100 mg to use as needed.

## 2020-09-26 NOTE — Progress Notes (Signed)
  Subjective:  Patient ID: Marco [name], male    DOB: 14-Sep-1955,  MRN: [id number]  Chief Complaint  Patient presents with  . Diabetes    Foot pain . 2 month follow up  PT stated that he is still having some pain   65 y.o. male returns for the above complaint.  Patient presents with thickened elongated dystrophic toenails x10.  Patient was last seen by Dr. Cannon Kettle who debrided them.  Patient states not able to do it himself.  They are painful to touch.  He would like for me to debride them down.  He he has secondary complaint bilateral lower extremity without itching xerosis.  He is would like to discuss treatment options for it.  He denies any other acute complaints.  He is a diabetic with last A1c of 6.0  Objective:  There were no vitals filed for this visit. Podiatric Exam: Vascular: dorsalis pedis and posterior tibial pulses are palpable bilateral. Capillary return is immediate. Temperature gradient is WNL. Skin turgor WNL  Sensorium: Normal Semmes Weinstein monofilament test. Normal tactile sensation bilaterally. Nail Exam: Pt has thick disfigured discolored nails with subungual debris noted bilateral entire nail hallux through fifth toenails.  Pain on palpation to the nails. Ulcer Exam: There is no evidence of ulcer or pre-ulcerative changes or infection. Orthopedic Exam: Muscle tone and strength are WNL. No limitations in general ROM. No crepitus or effusions noted. HAV  B/L.  Hammer toes 2-5  B/L. Skin: No Porokeratosis. No infection or ulcers.  Bilateral lower extremity xerosis    Assessment & Plan:   1. Type 2 diabetes mellitus with polyneuropathy ([place])   2. Xerosis cutis   3. Pain due to onychomycosis of toenails of both feet     Patient was evaluated and treated and all questions answered.  Xerosis bilateral lower extremity -I explained to the patient the etiology of xerosis and various treatment options were extensively discussed.  I explained to the patient the  importance of maintaining moisturization of the skin with application of over-the-counter lotion such as Eucerin or Luciderm.  I have asked the patient to apply this twice a day.  If unable to resolve patient will benefit from prescription lotion.    Onychomycosis with pain  -Nails palliatively debrided as below. -Educated on self-care  Procedure: Nail Debridement Rationale: pain  Type of Debridement: manual, sharp debridement. Instrumentation: Nail nipper, rotary burr. Number of Nails: 10  Procedures and Treatment: Consent by patient was obtained for treatment procedures. The patient understood the discussion of treatment and procedures well. All questions were answered thoroughly reviewed. Debridement of mycotic and hypertrophic toenails, 1 through 5 bilateral and clearing of subungual debris. No ulceration, no infection noted.  Return Visit-Office Procedure: Patient instructed to return to the office for a follow up visit 3 months for continued evaluation and treatment.  Boneta Lucks, DPM    No follow-ups on file.

## 2020-10-01 ENCOUNTER — Other Ambulatory Visit (HOSPITAL_COMMUNITY): Payer: Self-pay | Admitting: Internal Medicine

## 2020-10-01 ENCOUNTER — Other Ambulatory Visit: Payer: Self-pay | Admitting: Cardiology

## 2020-10-01 ENCOUNTER — Other Ambulatory Visit: Payer: Self-pay | Admitting: Internal Medicine

## 2020-10-01 MED FILL — POTASSIUM CHLORIDE CRYS ER: 20 | 90 days supply | Qty: 90 | Fill #0

## 2020-10-01 MED FILL — FAMOTIDINE 20 MG TABLET: 20 | 30 days supply | Qty: 60 | Fill #0

## 2020-10-01 MED FILL — ALLOPURINOL 100 MG TABS: 100 | 30 days supply | Qty: 60 | Fill #4

## 2020-10-01 MED FILL — ATORVASTATIN 40 MG TABLET: 40 | 15 days supply | Qty: 15 | Fill #0

## 2020-10-01 MED FILL — RAMELTEON 8 MG TABS: 8 | 30 days supply | Qty: 30 | Fill #1

## 2020-10-01 MED FILL — CARVEDILOL 12.5 MG TABLET: 12.5 | 90 days supply | Qty: 180 | Fill #1

## 2020-10-01 MED FILL — DULOXETINE HCL 20 MG CPEP: 20 | 90 days supply | Qty: 90 | Fill #0

## 2020-10-09 ENCOUNTER — Telehealth: Payer: Self-pay

## 2020-10-09 NOTE — Telephone Encounter (Signed)
LVM for pt to let him know that the insurance company denied his Michaelle Birks Kit

## 2020-10-10 NOTE — Telephone Encounter (Signed)
LVM for pt to contact his insurance company to see why the freesty Librs was denied bc I know no more than the information I get from them.

## 2020-10-10 NOTE — Telephone Encounter (Signed)
Patient returned call from [facility]

## 2020-10-11 NOTE — Telephone Encounter (Signed)
Spoke with pt and told him to contact his insurance company to see why they denied his Orlene Plum and also let them know why he was requesting for the Orlene Plum or if there is anything else that he could try that they would cover and to let us know.

## 2020-10-11 NOTE — Telephone Encounter (Signed)
Patient asked if you could give him a call back after 2:00 today - he's about to get on an air plane from [place]

## 2020-10-11 NOTE — Telephone Encounter (Signed)
Patient returning call.

## 2020-10-13 DIAGNOSIS — J9601 Acute respiratory failure with hypoxia: Secondary | ICD-10-CM | POA: Diagnosis not present

## 2020-10-14 DIAGNOSIS — R059 Cough, unspecified: Secondary | ICD-10-CM | POA: Insufficient documentation

## 2020-10-14 NOTE — Assessment & Plan Note (Signed)
Former smoker with morning cough. Not cclear if trace heme (on aspirin) is from dry nose or chest. Plan- CXR

## 2020-10-14 NOTE — Assessment & Plan Note (Addendum)
Benefits from CPAP.We will try reducing presure to reduce drying airflow Plan- change to [facility] 12/9, PS2. Saline nasal gel, mask fitting

## 2020-10-15 MED FILL — ACCU-CHEK FASTCLIX LANCETS: 51 days supply | Qty: 102 | Fill #1

## 2020-10-16 ENCOUNTER — Telehealth: Payer: Self-pay

## 2020-10-16 NOTE — Telephone Encounter (Signed)
Reached out to the patient sharing information for upcoming mobile clinics sites. Patient discussed with staff coming to one of the sites.

## 2020-10-17 ENCOUNTER — Other Ambulatory Visit: Payer: Self-pay

## 2020-10-17 ENCOUNTER — Ambulatory Visit: Payer: Medicare Other | Admitting: Physician Assistant

## 2020-10-17 ENCOUNTER — Other Ambulatory Visit (HOSPITAL_COMMUNITY): Payer: Self-pay | Admitting: Physician Assistant

## 2020-10-17 VITALS — BP 141/73 | HR 75 | Temp 98.2°F | Resp 18 | Ht 63.0 in | Wt 218.0 lb

## 2020-10-17 DIAGNOSIS — E1122 Type 2 diabetes mellitus with diabetic chronic kidney disease: Secondary | ICD-10-CM | POA: Diagnosis not present

## 2020-10-17 DIAGNOSIS — R0981 Nasal congestion: Secondary | ICD-10-CM

## 2020-10-17 DIAGNOSIS — N183 Chronic kidney disease, stage 3 unspecified: Secondary | ICD-10-CM

## 2020-10-17 DIAGNOSIS — Z8619 Personal history of other infectious and parasitic diseases: Secondary | ICD-10-CM

## 2020-10-17 DIAGNOSIS — L03012 Cellulitis of left finger: Secondary | ICD-10-CM

## 2020-10-17 LAB — POCT GLYCOSYLATED HEMOGLOBIN (HGB A1C): Hemoglobin A1C: 8.8 % — AB (ref 4.0–5.6)

## 2020-10-17 MED ORDER — MUPIROCIN CALCIUM 2 % EX CREA: 1.0000 "application " | TOPICAL_CREAM | Freq: Two times a day (BID) | CUTANEOUS | 0 refills | Status: DC

## 2020-10-17 MED ORDER — SULFAMETHOXAZOLE-TRIMETHOPRIM 800-160 MG PO TABS: 1.0000 | ORAL_TABLET | Freq: Two times a day (BID) | ORAL | 0 refills | Status: DC

## 2020-10-17 MED ORDER — CETIRIZINE HCL 10 MG PO TABS: 10.0000 mg | ORAL_TABLET | Freq: Every day | ORAL | 11 refills | Status: DC

## 2020-10-17 MED FILL — MUPIROCIN 2% OINTMENT: 2 | 10 days supply | Qty: 22 | Fill #0

## 2020-10-17 MED FILL — SULFAMETHOXAZOLE-TMP DS TAB: 800-160 | 10 days supply | Qty: 20 | Fill #0

## 2020-10-17 NOTE — Progress Notes (Signed)
Patient has taken medication today at 9am. Patient has eaten today. Patient denies pain at this time Patient requesting sift click accu check lancets.

## 2020-10-17 NOTE — Patient Instructions (Signed)
For your fingers, you will take Bactrim, and use antibiotic cream.  For your nasal congestion, I encourage you to purchase some nasal saline spray over-the-counter and use it prior to going to bed and again when you wake up in the morning.  I also encourage you to start taking Zyrtec on a daily basis.  For your dry skin around and inside your ears, I encourage you to avoid  soap inside your ears, make sure that you are using a good moisturizer on the external part of your ears.  Please let us know if there is anything else we can do for you  Kennieth Rad, [name] Physician Assistant [facility] [email or web address]  Cellulitis, Adult  Cellulitis is a skin infection. The infected area is usually warm, red, swollen, and tender. This condition occurs most often in the arms and lower legs. The infection can travel to the muscles, blood, and underlying tissue and become serious. It is very important to get treated for this condition. What are the causes? Cellulitis is caused by bacteria. The bacteria enter through a break in the skin, such as a cut, burn, insect bite, open sore, or crack. What increases the risk? This condition is more likely to occur in people who:  Have a weak body defense system (immune system).  Have open wounds on the skin, such as cuts, burns, bites, and scrapes. Bacteria can enter the body through these open wounds.  Are older than 66 years of age.  Have diabetes.  Have a type of long-lasting (chronic) liver disease (cirrhosis) or kidney disease.  Are obese.  Have a skin condition such as: ? Itchy rash (eczema). ? Slow movement of blood in the veins (venous stasis). ? Fluid buildup below the skin (edema).  Have had radiation therapy.  Use IV drugs. What are the signs or symptoms? Symptoms of this condition include:  Redness, streaking, or spotting on the skin.  Swollen area of the skin.  Tenderness  or pain when an area of the skin is touched.  Warm skin.  A fever.  Chills.  Blisters. How is this diagnosed? This condition is diagnosed based on a medical history and physical exam. You may also have tests, including:  Blood tests.  Imaging tests. How is this treated? Treatment for this condition may include:  Medicines, such as antibiotic medicines or medicines to treat allergies (antihistamines).  Supportive care, such as rest and application of cold or warm cloths (compresses) to the skin.  Hospital care, if the condition is severe. The infection usually starts to get better within 1-2 days of treatment. Follow these instructions at home: Medicines  Take over-the-counter and prescription medicines only as told by your health care provider.  If you were prescribed an antibiotic medicine, take it as told by your health care provider. Do not stop taking the antibiotic even if you start to feel better. General instructions  Drink enough fluid to keep your urine pale yellow.  Do not touch or rub the infected area.  Raise (elevate) the infected area above the level of your heart while you are sitting or lying down.  Apply warm or cold compresses to the affected area as told by your health care provider.  Keep all follow-up visits as told by your health care provider. This is important. These visits let your health care provider make sure a more serious infection is not developing.   Contact a health care provider if:  You have a fever.  Your  symptoms do not begin to improve within 1-2 days of starting treatment.  Your bone or joint underneath the infected area becomes painful after the skin has healed.  Your infection returns in the same area or another area.  You notice a swollen bump in the infected area.  You develop new symptoms.  You have a general ill feeling (malaise) with muscle aches and pains. Get help right away if:  Your symptoms get worse.  You  feel very sleepy.  You develop vomiting or diarrhea that persists.  You notice red streaks coming from the infected area.  Your red area gets larger or turns dark in color. These symptoms may represent a serious problem that is an emergency. Do not wait to see if the symptoms will go away. Get medical help right away. Call your local emergency services (911 in the [place] Do not drive yourself to the hospital. Summary  Cellulitis is a skin infection. This condition occurs most often in the arms and lower legs.  Treatment for this condition may include medicines, such as antibiotic medicines or antihistamines.  Take over-the-counter and prescription medicines only as told by your health care provider. If you were prescribed an antibiotic medicine, do not stop taking the antibiotic even if you start to feel better.  Contact a health care provider if your symptoms do not begin to improve within 1-2 days of starting treatment or your symptoms get worse.  Keep all follow-up visits as told by your health care provider. This is important. These visits let your health care provider make sure that a more serious infection is not developing. This information is not intended to replace advice given to you by your health care provider. Make sure you discuss any questions you have with your health care provider. Document Revised: 08/08/2019 Document Reviewed: 12/17/2017 Elsevier Patient Education  [place].

## 2020-10-17 NOTE — Progress Notes (Signed)
Established Patient Office Visit  Subjective:  Patient ID: Marco [name], male    DOB: May 14, 1956  Age: 65 y.o. MRN: [id number]  CC:  Chief Complaint  Patient presents with  . Blood Sugar Problem    Sore Fingers from Home Checks    HPI Marco [name] reports that he has been having swelling, tenderness and extreme pain on his left thumb, pointer and middle finger.  Reports that he uses those to check his blood glucose levels.  Reports that the pain swelling and tenderness has been present for the past 2 weeks, endorses that it seems to be getting worse.  Reports that he has been using Neosporin without relief.  Reports that he has been having left-sided nasal congestion for several months, states that it is worse in the morning, states that he tried Flonase without much relief.  Does endorse that he has been using his CPAP on a regular basis.  Reports that his ear canals have been dry, states that he does wash them with soap and water.  States that this has been present for the past few weeks.     Past Medical History:  Diagnosis Date  . Benign colon polyp 08/01/2013   [facility] in [place]. large base tranverse colon polyp was biopsied. polyp was benign with minimal surface hyperplastic change.  . Chronic combined systolic and diastolic CHF (congestive heart failure) ([place])   . Chronic pain   . CKD (chronic kidney disease), stage III ([place])   . Diabetes mellitus without complication ([place])   . Diverticulosis   . Esophageal hiatal hernia 07/29/2013   confirmed on EGD   . Esophageal stricture   . Essential hypertension   . Gastritis 07/29/2013   confirmed on EGD, bx done an negative for intestinal metaplasia, dsyplasia or H. pylori. normal gastric emptying study done 07/13/2013.  [name] GERD (gastroesophageal reflux disease)   . Morbid obesity ([place])   . Non-obstructive CAD    a. 02/2013 Cath ([place]): nonobs dzs;  b. 09/2014 Myoview ([employer]): EF 55%, no ischemia;  c. Cath 05/2018  mild CAD no obstruction.  . Persistent atrial fibrillation ([place])    a. 02/2013 s/p rfca in [place], [place]-->prev on Xarelto, d/c'd 2/2 anemia, ? GIB.  [name] Rheumatoid arthritis ([place])   . Sigmoid diverticulosis 08/01/2013   confirmed on colonscopy. record scanned into chart    Past Surgical History:  Procedure Laterality Date  . CARDIAC CATHETERIZATION  05/2014   ablation for atrial fibrillation  . CARDIAC CATHETERIZATION N/A 11/16/2015   Procedure: Left Heart Cath and Coronary Angiography;  Surgeon: Leonie Man, MD;  Location: [facility];  Service: Cardiovascular;  Laterality: N/A;  . COLON RESECTION  09/2013   due to large, abnormal polpy. non cancerous per patient.   . COLON SURGERY  09/2014   colon resection   . IR PERC PLEURAL DRAIN W/INDWELL CATH W/IMG GUIDE  01/07/2020  . LEFT HEART CATH AND CORONARY ANGIOGRAPHY N/A 05/19/2018   Procedure: LEFT HEART CATH AND CORONARY ANGIOGRAPHY;  Surgeon: Wellington Hampshire, MD;  Location: [facility];  Service: Cardiovascular;  Laterality: N/A;    Family History  Problem Relation Age of Onset  . Hypertension Mother   . Diabetes Mother   . COPD Mother   . Lung cancer Mother        Smoker   . Hypertension Father   . Diabetes Father   . Heart Problems Father   . COPD  Father   . Colon cancer Neg Hx   . Stomach cancer Neg Hx     Social History   Socioeconomic History  . Marital status: Married    Spouse name: Amethyst  . Number of children: 8  . Years of education: 65  . Highest education level: Some college, no degree  Occupational History    Comment: disabled  Tobacco Use  . Smoking status: Former Smoker    Packs/day: 0.50    Years: 10.00    Pack years: 5.00    Types: Cigarettes    Quit date: 04/11/2008    Years since quitting: 12.5  . Smokeless tobacco: Never Used  Vaping Use  . Vaping Use: Never used  Substance and Sexual Activity  . Alcohol use: No    Alcohol/week: 0.0 standard drinks  . Drug use: No  .  Sexual activity: Yes    Partners: Female    Birth control/protection: None  Other Topics Concern  . Not on file  Social History Narrative   Lives with wife. Does not work.  On disability.    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 Provided crisis assistance program information to assist with bills       Has issues getting food- gets $36/month in food stamps- already has list of food pantries but has not tried them- encouraged pt to try food pantries and reach out to clinic for help if needed   Social Determinants of Health   Financial Resource Strain: Not on file  Food Insecurity: Not on file  Transportation Needs: Not on file  Physical Activity: Not on file  Stress: Not on file  Social Connections: Not on file  Intimate Partner Violence: Not on file    Outpatient Medications Prior to Visit  Medication Sig Dispense Refill  . albuterol (VENTOLIN HFA) 108 (90 Base) MCG/ACT inhaler Inhale 2 puffs into the lungs every 6 (six) hours as needed for wheezing or shortness of breath. 1 each 1  . allopurinol (ZYLOPRIM) 100 MG tablet Take 200 mg by mouth every evening.     [name] aspirin (EQ ASPIRIN ADULT LOW DOSE) 81 MG EC tablet Take 1 tablet (81 mg total) by mouth daily. TAKE 1 TABLET BY MOUTH ONCE DAILY. SWALLOW WHOLE Please hold asa until you repeat blood work, resume if blood count stable and ok with you primary care doctor    . atorvastatin (LIPITOR) 40 MG tablet Take 1 tablet (40 mg total) by mouth daily. Please make overdue appt with [name] before anymore refills. Thank you Final Attempt 15 tablet 0  . carvedilol (COREG) 12.5 MG tablet TAKE 1 TABLET BY MOUTH TWICE DAILY WITH A MEAL 180 tablet 1  . COSENTYX SENSOREADY PEN 150 MG/ML SOAJ SMARTSIG:1 Pre-Filled Pen Syringe SUB-Q Every 4 Weeks    . diclofenac  Sodium (VOLTAREN) 1 % GEL Apply 4 g topically 4 (four) times daily. (Patient taking differently: Apply 4 g topically 4 (four) times daily as needed (pain).) 100 g 0  . DULoxetine (CYMBALTA) 20 MG capsule TAKE 1 CAPSULE BY MOUTH ONCE DAILY 90 capsule 0  . famotidine (PEPCID) 20 MG tablet TAKE 1 TABLET BY MOUTH TWO TIMES DAILY 60 tablet 0  . fluticasone (FLONASE) 50 MCG/ACT nasal spray Place 1 spray into both nostrils daily  as needed for allergies or rhinitis. 16 g 1  . furosemide (LASIX) 80 MG tablet Take 1 tablet (80 mg total) by mouth daily. 90 tablet 3  . glucose blood (ACCU-CHEK GUIDE) test strip Use to test 4 times daily. 400 strip 3  . hydrALAZINE (APRESOLINE) 25 MG tablet Take 1 tablet (25 mg total) by mouth 3 (three) times daily. 90 tablet 3  . Insulin Lispro Prot & Lispro (HUMALOG MIX 75/25 KWIKPEN) (75-25) 100 UNIT/ML Kwikpen Inject 70 Units into the skin daily. 144 mL 3  . Accu-Chek FastClix Lancets MISC 1 each by Other route 2 (two) times daily. E11.42 (Patient not taking: Reported on 10/17/2020) 204 each 0  . Continuous Blood Gluc Sensor (FREESTYLE LIBRE 2 SENSOR) MISC 1 Device by Does not apply route every 14 (fourteen) days. (Patient not taking: Reported on 10/17/2020) 6 each 3  . isosorbide mononitrate (IMDUR) 30 MG 24 hr tablet Take 1 tablet (30 mg total) by mouth daily. 30 tablet 3  . naloxone (NARCAN) nasal spray 4 mg/0.1 mL Place in nostril in the event of suspected overdose on Narcotic medication (Oxycodone) 1 each 0  . oxyCODONE (OXY IR/ROXICODONE) 5 MG immediate release tablet Take 5 mg by mouth 3 (three) times daily as needed for moderate pain.     . polyethylene glycol (MIRALAX / GLYCOLAX) 17 g packet Take 17 g by mouth daily as needed for moderate constipation or severe constipation. 14 each 0  . Potassium Chloride ER 20 MEQ TBCR Take 20 mEq by mouth daily.     . potassium chloride SA (KLOR-CON) 20 MEQ tablet TAKE 1 TABLET (20 MEQ) BY MOUTH DAILY. 90 tablet 3  . ramelteon  (ROZEREM) 8 MG tablet TAKE 1 TABLET BY MOUTH AT BEDTIME 30 tablet 1  . UNIFINE PENTIPS 32G X 4 MM MISC USE AS DIRECTED DAILY 100 each 0  . VYNDAMAX 61 MG CAPS TAKE 1 CAPSULE (61 MG) BY MOUTH DAILY. 30 capsule 11   No facility-administered medications prior to visit.    Allergies  Allergen Reactions  . Nsaids Other (See Comments)    Stomach pains. Ulcers - stated by patient   . Jardiance [Empagliflozin] Nausea And Vomiting    ROS Review of Systems  Constitutional: Negative for chills and fever.  HENT: Negative for congestion, ear discharge, ear pain and sinus pressure.   Eyes: Negative.   Respiratory: Negative.   Cardiovascular: Negative.   Gastrointestinal: Negative.   Endocrine: Negative.   Genitourinary: Negative.   Musculoskeletal: Negative.   Skin: Positive for color change and wound.  Allergic/Immunologic: Negative.   Neurological: Negative.   Hematological: Negative.   Psychiatric/Behavioral: Negative.       Objective:    Physical Exam Vitals and nursing note reviewed.  Constitutional:      Appearance: Normal appearance.  HENT:     Head: Normocephalic and atraumatic.     Right Ear: Tympanic membrane, ear canal and external ear normal.     Left Ear: Tympanic membrane, ear canal and external ear normal.     Nose: Nose normal. No congestion.     Mouth/Throat:     Mouth: Mucous membranes are moist.     Pharynx: Oropharynx is clear.  Eyes:     Extraocular Movements: Extraocular movements intact.     Conjunctiva/sclera: Conjunctivae normal.     Pupils: Pupils are equal, round, and reactive to light.  Cardiovascular:     Rate and Rhythm: Normal rate and regular rhythm.     Pulses:  Normal pulses.     Heart sounds: Normal heart sounds.  Pulmonary:     Effort: Pulmonary effort is normal.     Breath sounds: Normal breath sounds.  Musculoskeletal:        General: Normal range of motion.     Cervical back: Normal range of motion and neck supple.  Skin:     Comments: See photo  Neurological:     General: No focal deficit present.     Mental Status: He is alert and oriented to person, place, and time.  Psychiatric:        Mood and Affect: Mood normal.        Behavior: Behavior normal.        Thought Content: Thought content normal.        Judgment: Judgment normal.         BP (!) 141/73 (BP Location: Left Arm, Patient Position: Sitting, Cuff Size: Large)   Pulse 75   Temp 98.2 F (36.8 C) (Oral)   Resp 18   Ht 5\' 3"  (1.6 m)   Wt 218 lb (98.9 kg)   SpO2 96%   BMI 38.62 kg/m  Wt Readings from Last 3 Encounters:  10/17/20 218 lb (98.9 kg)  08/30/20 220 lb (99.8 kg)  08/21/20 217 lb (98.4 kg)     Health Maintenance Due  Topic Date Due  . COLONOSCOPY (Pts 45-34yrs Insurance coverage will need to be confirmed)  04/18/2020    There are no preventive care reminders to display for this patient.  Lab Results  Component Value Date   TSH 1.102 12/09/2019   Lab Results  Component Value Date   WBC 10.1 08/21/2020   HGB 13.4 08/21/2020   HCT 40.0 08/21/2020   MCV 94.3 08/21/2020   PLT 232 08/21/2020   Lab Results  Component Value Date   NA 138 08/21/2020   K 3.5 08/21/2020   CO2 25 08/21/2020   GLUCOSE 158 (H) 08/21/2020   BUN 18 08/21/2020   CREATININE 1.53 (H) 08/21/2020   BILITOT 0.6 08/21/2020   ALKPHOS 57 08/21/2020   AST 14 (L) 08/21/2020   ALT 24 08/21/2020   PROT 7.2 08/21/2020   ALBUMIN 3.3 (L) 08/21/2020   CALCIUM 8.9 08/21/2020   ANIONGAP 12 08/21/2020   GFR 54.36 (L) 10/20/2019   Lab Results  Component Value Date   CHOL 126 04/22/2019   Lab Results  Component Value Date   HDL 32 (L) 04/22/2019   Lab Results  Component Value Date   LDLCALC 70 04/22/2019   Lab Results  Component Value Date   TRIG 148 01/03/2020   Lab Results  Component Value Date   CHOLHDL 3.9 04/22/2019   Lab Results  Component Value Date   HGBA1C 8.8 (A) 10/17/2020      Assessment & Plan:   Problem List Items  Addressed This Visit      Endocrine   Type 2 diabetes mellitus with stage 3 chronic kidney disease, without long-term current use of insulin (HCC) (Chronic)   Relevant Orders   HgB A1c (Completed)    Other Visit Diagnoses    Cellulitis of finger of left hand    -  Primary   Relevant Medications   sulfamethoxazole-trimethoprim (BACTRIM DS) 800-160 MG tablet   mupirocin cream (BACTROBAN) 2 %   Nasal congestion       Relevant Medications   cetirizine (ZYRTEC ALLERGY) 10 MG tablet   History of Clostridium difficile infection  1. Cellulitis of finger of left hand Trial Bactrim, Bactroban.  Encourage patient to keep areas clean and dry.  Red flags given for prompt reevaluation - sulfamethoxazole-trimethoprim (BACTRIM DS) 800-160 MG tablet; Take 1 tablet by mouth 2 (two) times daily for 10 days.  Dispense: 20 tablet; Refill: 0 - mupirocin cream (BACTROBAN) 2 %; Apply 1 application topically 2 (two) times daily.  Dispense: 15 g; Refill: 0  2. Type 2 diabetes mellitus with stage 3 chronic kidney disease, without long-term current use of insulin, unspecified whether stage 3a or 3b CKD ([place]) Continue follow-up with endocrinology.  Patient has upcoming appointment - HgB A1c  3. Nasal congestion Encouraged patient to use over-the-counter nasal saline prior to bed and after waking in the morning.  Encouraged trial of Zyrtec - cetirizine (ZYRTEC ALLERGY) 10 MG tablet; Take 1 tablet (10 mg total) by mouth daily.  Dispense: 30 tablet; Refill: 11  4. History of Clostridium difficile infection Patient education given on warning signs, red flags given for prompt reevaluation   I have reviewed the patient's medical history (PMH, PSH, Social History, Family History, Medications, and allergies) , and have been updated if relevant. I spent 30 minutes reviewing chart and  face to face time with patient.     Meds ordered this encounter  Medications  . sulfamethoxazole-trimethoprim (BACTRIM DS)  800-160 MG tablet    Sig: Take 1 tablet by mouth 2 (two) times daily for 10 days.    Dispense:  20 tablet    Refill:  0    Order Specific Question:   Supervising Provider    Answer:   Asencion Noble E [[id number]]  . cetirizine (ZYRTEC ALLERGY) 10 MG tablet    Sig: Take 1 tablet (10 mg total) by mouth daily.    Dispense:  30 tablet    Refill:  11    Order Specific Question:   Supervising Provider    Answer:   Asencion Noble E [[id number]]  . mupirocin cream (BACTROBAN) 2 %    Sig: Apply 1 application topically 2 (two) times daily.    Dispense:  15 g    Refill:  0    Order Specific Question:   Supervising Provider    Answer:   Elsie Stain [[id number]]    Follow-up: Return if symptoms worsen or fail to improve.    Loraine Grip Mayers, [name]

## 2020-10-18 DIAGNOSIS — M25511 Pain in right shoulder: Secondary | ICD-10-CM | POA: Diagnosis not present

## 2020-10-18 DIAGNOSIS — E1143 Type 2 diabetes mellitus with diabetic autonomic (poly)neuropathy: Secondary | ICD-10-CM | POA: Diagnosis not present

## 2020-10-18 DIAGNOSIS — G890 Central pain syndrome: Secondary | ICD-10-CM | POA: Diagnosis not present

## 2020-10-18 DIAGNOSIS — Z79891 Long term (current) use of opiate analgesic: Secondary | ICD-10-CM | POA: Diagnosis not present

## 2020-10-18 DIAGNOSIS — M5412 Radiculopathy, cervical region: Secondary | ICD-10-CM | POA: Diagnosis not present

## 2020-10-18 DIAGNOSIS — M25512 Pain in left shoulder: Secondary | ICD-10-CM | POA: Diagnosis not present

## 2020-10-18 DIAGNOSIS — M79661 Pain in right lower leg: Secondary | ICD-10-CM | POA: Diagnosis not present

## 2020-10-18 DIAGNOSIS — G894 Chronic pain syndrome: Secondary | ICD-10-CM | POA: Diagnosis not present

## 2020-10-18 DIAGNOSIS — M542 Cervicalgia: Secondary | ICD-10-CM | POA: Diagnosis not present

## 2020-10-18 DIAGNOSIS — G8929 Other chronic pain: Secondary | ICD-10-CM | POA: Diagnosis not present

## 2020-10-18 DIAGNOSIS — M79662 Pain in left lower leg: Secondary | ICD-10-CM | POA: Diagnosis not present

## 2020-10-18 DIAGNOSIS — M545 Low back pain, unspecified: Secondary | ICD-10-CM | POA: Diagnosis not present

## 2020-10-19 ENCOUNTER — Other Ambulatory Visit (HOSPITAL_COMMUNITY): Payer: Self-pay | Admitting: Internal Medicine

## 2020-10-19 DIAGNOSIS — M2550 Pain in unspecified joint: Secondary | ICD-10-CM | POA: Diagnosis not present

## 2020-10-19 DIAGNOSIS — L409 Psoriasis, unspecified: Secondary | ICD-10-CM | POA: Diagnosis not present

## 2020-10-19 DIAGNOSIS — G629 Polyneuropathy, unspecified: Secondary | ICD-10-CM | POA: Diagnosis not present

## 2020-10-19 DIAGNOSIS — Z79899 Other long term (current) drug therapy: Secondary | ICD-10-CM | POA: Diagnosis not present

## 2020-10-19 DIAGNOSIS — M1A09X0 Idiopathic chronic gout, multiple sites, without tophus (tophi): Secondary | ICD-10-CM | POA: Diagnosis not present

## 2020-10-20 DIAGNOSIS — R0981 Nasal congestion: Secondary | ICD-10-CM | POA: Insufficient documentation

## 2020-10-20 DIAGNOSIS — Z8619 Personal history of other infectious and parasitic diseases: Secondary | ICD-10-CM | POA: Insufficient documentation

## 2020-10-20 DIAGNOSIS — L03012 Cellulitis of left finger: Secondary | ICD-10-CM | POA: Insufficient documentation

## 2020-10-23 NOTE — Telephone Encounter (Signed)
Patient called and requested a call back regarding [employer].  Stated he called his insurance company and now has information regarding how it can be covered.  Call back number [phone]

## 2020-10-24 NOTE — Telephone Encounter (Signed)
Patient called and requested a call back regarding [employer].  Stated he called his insurance company and now has information regarding how it can be covered.  Call back number [phone]

## 2020-10-26 ENCOUNTER — Other Ambulatory Visit: Payer: Self-pay | Admitting: Endocrinology

## 2020-10-26 DIAGNOSIS — Z794 Long term (current) use of insulin: Secondary | ICD-10-CM

## 2020-10-26 DIAGNOSIS — E118 Type 2 diabetes mellitus with unspecified complications: Secondary | ICD-10-CM

## 2020-10-27 ENCOUNTER — Telehealth: Payer: Self-pay

## 2020-10-27 NOTE — Telephone Encounter (Signed)
Called pt to F/U with some information regarding [employer]

## 2020-10-29 ENCOUNTER — Other Ambulatory Visit: Payer: Self-pay | Admitting: Cardiology

## 2020-10-29 ENCOUNTER — Telehealth: Payer: Self-pay | Admitting: Endocrinology

## 2020-10-29 ENCOUNTER — Other Ambulatory Visit: Payer: Self-pay | Admitting: Internal Medicine

## 2020-10-29 ENCOUNTER — Other Ambulatory Visit: Payer: Self-pay

## 2020-10-29 DIAGNOSIS — E1122 Type 2 diabetes mellitus with diabetic chronic kidney disease: Secondary | ICD-10-CM

## 2020-10-29 DIAGNOSIS — N1831 Chronic kidney disease, stage 3a: Secondary | ICD-10-CM

## 2020-10-29 MED ORDER — ACCU-CHEK SOFTCLIX LANCETS MISC: 12 refills | Status: DC

## 2020-10-29 MED FILL — ACCU-CHEK SOFTCLIX LANCETS: 90 days supply | Qty: 100 | Fill #0

## 2020-10-29 MED FILL — FAMOTIDINE 20 MG TABLET: 20 | 30 days supply | Qty: 60 | Fill #0

## 2020-10-29 MED FILL — ATORVASTATIN 40 MG TABLET: 40 | 15 days supply | Qty: 15 | Fill #0

## 2020-10-29 NOTE — Telephone Encounter (Signed)
Rx sent 

## 2020-10-29 NOTE — Telephone Encounter (Signed)
Patient called to advise that the lancets we sent the Accu-Chek FastClix is incorrect.  He uses the Accu-Chek Soft Click.  Please resend Accu-Chek Soft Click to the [facility].  He has NO LANCETS at this time and is unable to check his blood sugars

## 2020-10-31 ENCOUNTER — Other Ambulatory Visit: Payer: Self-pay

## 2020-10-31 ENCOUNTER — Ambulatory Visit (INDEPENDENT_AMBULATORY_CARE_PROVIDER_SITE_OTHER): Payer: Medicare Other | Admitting: Endocrinology

## 2020-10-31 VITALS — BP 110/70 | HR 65 | Ht 66.0 in | Wt 215.2 lb

## 2020-10-31 DIAGNOSIS — N183 Chronic kidney disease, stage 3 unspecified: Secondary | ICD-10-CM

## 2020-10-31 DIAGNOSIS — E11649 Type 2 diabetes mellitus with hypoglycemia without coma: Secondary | ICD-10-CM

## 2020-10-31 DIAGNOSIS — E1122 Type 2 diabetes mellitus with diabetic chronic kidney disease: Secondary | ICD-10-CM

## 2020-10-31 DIAGNOSIS — Z794 Long term (current) use of insulin: Secondary | ICD-10-CM | POA: Diagnosis not present

## 2020-10-31 MED ORDER — INSULIN LISPRO PROT & LISPRO (75-25 MIX) 100 UNIT/ML KWIKPEN: 75.0000 [IU] | PEN_INJECTOR | Freq: Every day | SUBCUTANEOUS | 3 refills | Status: DC

## 2020-10-31 NOTE — Progress Notes (Signed)
Subjective:    Patient ID: Marco [name], male    DOB: 1955/09/18, 65 y.o.   MRN: [id number]  HPI Pt returns for f/u of diabetes mellitus: DM type: Insulin-requiring type 2 Dx'ed: 8242 Complications: polyneuropathy, CAD, NPDR, and stage 3a CRI.  Therapy: insulin since soon after dx DKA: never Severe hypoglycemia: once (2020) Pancreatitis: never.  Pancreatic imaging: never.  Other: he intermittently takes prednisone, for psoriasis; he declined to continue multiple daily injections; due to renal failure (and fasting hypoglycemia), he takes 75/25 insulin; he declines GLP med. Interval history: no recent steroids.  pt says cbg's vary from 104-200.  There is no trend throughout the day. pt says he never misses the insulin.     Past Medical History:  Diagnosis Date  . Benign colon polyp 08/01/2013   [facility] in [place]. large base tranverse colon polyp was biopsied. polyp was benign with minimal surface hyperplastic change.  . Chronic combined systolic and diastolic CHF (congestive heart failure) ([place])   . Chronic pain   . CKD (chronic kidney disease), stage III ([place])   . Diabetes mellitus without complication ([place])   . Diverticulosis   . Esophageal hiatal hernia 07/29/2013   confirmed on EGD   . Esophageal stricture   . Essential hypertension   . Gastritis 07/29/2013   confirmed on EGD, bx done an negative for intestinal metaplasia, dsyplasia or H. pylori. normal gastric emptying study done 07/13/2013.  [name] GERD (gastroesophageal reflux disease)   . Morbid obesity ([place])   . Non-obstructive CAD    a. 02/2013 Cath ([place]): nonobs dzs;  b. 09/2014 Myoview ([place]): EF 55%, no ischemia;  c. Cath 05/2018 mild CAD no obstruction.  . Persistent atrial fibrillation ([place])    a. 02/2013 s/p rfca in [place], [place]-->prev on Xarelto, d/c'd 2/2 anemia, ? GIB.  [name] Rheumatoid arthritis ([place])   . Sigmoid diverticulosis 08/01/2013   confirmed on colonscopy. record scanned into chart     Past Surgical History:  Procedure Laterality Date  . CARDIAC CATHETERIZATION  05/2014   ablation for atrial fibrillation  . CARDIAC CATHETERIZATION N/A 11/16/2015   Procedure: Left Heart Cath and Coronary Angiography;  Surgeon: Leonie Man, MD;  Location: [facility];  Service: Cardiovascular;  Laterality: N/A;  . COLON RESECTION  09/2013   due to large, abnormal polpy. non cancerous per patient.   . COLON SURGERY  09/2014   colon resection   . IR PERC PLEURAL DRAIN W/INDWELL CATH W/IMG GUIDE  01/07/2020  . LEFT HEART CATH AND CORONARY ANGIOGRAPHY N/A 05/19/2018   Procedure: LEFT HEART CATH AND CORONARY ANGIOGRAPHY;  Surgeon: Wellington Hampshire, MD;  Location: [place];  Service: Cardiovascular;  Laterality: N/A;    Social History   Socioeconomic History  . Marital status: Married    Spouse name: Amethyst  . Number of children: 8  . Years of education: 90  . Highest education level: Some college, no degree  Occupational History    Comment: disabled  Tobacco Use  . Smoking status: Former Smoker    Packs/day: 0.50    Years: 10.00    Pack years: 5.00    Types: Cigarettes    Quit date: 04/11/2008    Years since quitting: 12.5  . Smokeless tobacco: Never Used  Vaping Use  . Vaping Use: Never used  Substance and Sexual Activity  . Alcohol use: No    Alcohol/week: 0.0 standard drinks  . Drug use: No  .  Sexual activity: Yes    Partners: Female    Birth control/protection: None  Other Topics Concern  . Not on file  Social History Narrative   Lives with wife. Does not work.  On disability.    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 Provided crisis assistance program information to assist with bills       Has issues getting food- gets $36/month in food stamps- already has list of  food pantries but has not tried them- encouraged pt to try food pantries and reach out to clinic for help if needed   Social Determinants of Health   Financial Resource Strain: Not on file  Food Insecurity: Not on file  Transportation Needs: Not on file  Physical Activity: Not on file  Stress: Not on file  Social Connections: Not on file  Intimate Partner Violence: Not on file    Current Outpatient Medications on File Prior to Visit  Medication Sig Dispense Refill  . Accu-Chek Softclix Lancets lancets Use as instructed 100 each 12  . albuterol (VENTOLIN HFA) 108 (90 Base) MCG/ACT inhaler Inhale 2 puffs into the lungs every 6 (six) hours as needed for wheezing or shortness of breath. 1 each 1  . allopurinol (ZYLOPRIM) 100 MG tablet Take 200 mg by mouth every evening.     [name] aspirin (EQ ASPIRIN ADULT LOW DOSE) 81 MG EC tablet Take 1 tablet (81 mg total) by mouth daily. TAKE 1 TABLET BY MOUTH ONCE DAILY. SWALLOW WHOLE Please hold asa until you repeat blood work, resume if blood count stable and ok with you primary care doctor    . atorvastatin (LIPITOR) 40 MG tablet TAKE 1 TABLET BY MOUTH ONCE A DAY * NEEDS APPT BEFORE ANYMORE REFILLS 15 tablet 0  . carvedilol (COREG) 12.5 MG tablet TAKE 1 TABLET BY MOUTH TWICE DAILY WITH A MEAL 180 tablet 1  . cetirizine (ZYRTEC ALLERGY) 10 MG tablet Take 1 tablet (10 mg total) by mouth daily. 30 tablet 11  . Continuous Blood Gluc Sensor (FREESTYLE LIBRE 2 SENSOR) MISC 1 Device by Does not apply route every 14 (fourteen) days. 6 each 3  . COSENTYX SENSOREADY PEN 150 MG/ML SOAJ SMARTSIG:1 Pre-Filled Pen Syringe SUB-Q Every 4 Weeks    . diclofenac Sodium (VOLTAREN) 1 % GEL Apply 4 g topically 4 (four) times daily. (Patient taking differently: Apply 4 g topically 4 (four) times daily as needed (pain).) 100 g 0  . DULoxetine (CYMBALTA) 20 MG capsule TAKE 1 CAPSULE BY MOUTH ONCE DAILY 90 capsule 0  . famotidine (PEPCID) 20 MG tablet TAKE 1 TABLET BY MOUTH 2 TIMES  DAILY 60 tablet 2  . fluticasone (FLONASE) 50 MCG/ACT nasal spray Place 1 spray into both nostrils daily as needed for allergies or rhinitis. 16 g 1  . furosemide (LASIX) 80 MG tablet Take 1 tablet (80 mg total) by mouth daily. 90 tablet 3  . glucose blood (ACCU-CHEK GUIDE) test strip Use to test 4 times daily. 400 strip 3  . hydrALAZINE (APRESOLINE) 25 MG tablet Take 1 tablet (25 mg total) by mouth 3 (three) times daily. 90 tablet 3  . isosorbide mononitrate (IMDUR) 30 MG 24 hr tablet Take 1 tablet (30 mg total) by mouth daily. 30 tablet 3  . mupirocin cream (BACTROBAN) 2 %  Apply 1 application topically 2 (two) times daily. 15 g 0  . naloxone (NARCAN) nasal spray 4 mg/0.1 mL Place in nostril in the event of suspected overdose on Narcotic medication (Oxycodone) 1 each 0  . oxyCODONE (OXY IR/ROXICODONE) 5 MG immediate release tablet Take 5 mg by mouth 3 (three) times daily as needed for moderate pain.     . polyethylene glycol (MIRALAX / GLYCOLAX) 17 g packet Take 17 g by mouth daily as needed for moderate constipation or severe constipation. 14 each 0  . Potassium Chloride ER 20 MEQ TBCR Take 20 mEq by mouth daily.     . potassium chloride SA (KLOR-CON) 20 MEQ tablet TAKE 1 TABLET (20 MEQ) BY MOUTH DAILY. 90 tablet 3  . ramelteon (ROZEREM) 8 MG tablet TAKE 1 TABLET BY MOUTH AT BEDTIME 30 tablet 1  . UNIFINE PENTIPS 32G X 4 MM MISC USE AS DIRECTED DAILY 100 each 0  . VYNDAMAX 61 MG CAPS TAKE 1 CAPSULE (61 MG) BY MOUTH DAILY. 30 capsule 11   No current facility-administered medications on file prior to visit.    Allergies  Allergen Reactions  . Nsaids Other (See Comments)    Stomach pains. Ulcers - stated by patient   . Jardiance [Empagliflozin] Nausea And Vomiting    Family History  Problem Relation Age of Onset  . Hypertension Mother   . Diabetes Mother   . COPD Mother   . Lung cancer Mother        Smoker   . Hypertension Father   . Diabetes Father   . Heart Problems Father   .  COPD Father   . Colon cancer Neg Hx   . Stomach cancer Neg Hx     BP 110/70 (BP Location: Right Arm, Patient Position: Sitting, Cuff Size: Large)   Pulse 65   Ht 5\' 6"  (1.676 m)   Wt 215 lb 3.2 oz (97.6 kg)   SpO2 95%   BMI 34.73 kg/m    Review of Systems He denies hypoglycemia    Objective:   Physical Exam VITAL SIGNS:  See vs page GENERAL: no distress Pulses: dorsalis pedis intact bilat.   MSK: no deformity of the feet CV: no leg edema Skin:  no ulcer on the feet.  normal color and temp on the feet. Neuro: sensation is intact to touch on the feet   Lab Results  Component Value Date   HGBA1C 8.8 (A) 10/17/2020       Assessment & Plan:  Insulin-requiring type 2 DM, with stge 3a CRI: uncontrolled.  He declines to add GLP med.   Patient Instructions  check your blood sugar twice a day.  vary the time of day when you check, between before the 3 meals, and at bedtime.  also check if you have symptoms of your blood sugar being too high or too low.  please keep a record of the readings and bring it to your next appointment here (or you can bring the meter itself).  You can write it on any piece of paper.  please call us sooner if your blood sugar goes below 70, or if you have a lot of readings over 200.   Please increase the insulin to 75 units each morning.   On this type of insulin schedule, you should eat meals on a regular schedule (especially lunch).  If a meal is missed or significantly delayed, your blood sugar could go low.   Please come back for a follow-up appointment  in 2 months.

## 2020-10-31 NOTE — Patient Instructions (Addendum)
check your blood sugar twice a day.  vary the time of day when you check, between before the 3 meals, and at bedtime.  also check if you have symptoms of your blood sugar being too high or too low.  please keep a record of the readings and bring it to your next appointment here (or you can bring the meter itself).  You can write it on any piece of paper.  please call us sooner if your blood sugar goes below 70, or if you have a lot of readings over 200.   Please increase the insulin to 75 units each morning.   On this type of insulin schedule, you should eat meals on a regular schedule (especially lunch).  If a meal is missed or significantly delayed, your blood sugar could go low.   Please come back for a follow-up appointment in 2 months.

## 2020-11-05 ENCOUNTER — Ambulatory Visit: Payer: Medicare Other | Attending: Internal Medicine | Admitting: Internal Medicine

## 2020-11-05 ENCOUNTER — Other Ambulatory Visit: Payer: Self-pay | Admitting: Internal Medicine

## 2020-11-05 DIAGNOSIS — N1831 Chronic kidney disease, stage 3a: Secondary | ICD-10-CM | POA: Diagnosis not present

## 2020-11-05 DIAGNOSIS — E1159 Type 2 diabetes mellitus with other circulatory complications: Secondary | ICD-10-CM | POA: Diagnosis not present

## 2020-11-05 DIAGNOSIS — I10 Essential (primary) hypertension: Secondary | ICD-10-CM

## 2020-11-05 DIAGNOSIS — R300 Dysuria: Secondary | ICD-10-CM | POA: Diagnosis not present

## 2020-11-05 DIAGNOSIS — Z01818 Encounter for other preprocedural examination: Secondary | ICD-10-CM

## 2020-11-05 DIAGNOSIS — F5104 Psychophysiologic insomnia: Secondary | ICD-10-CM

## 2020-11-05 DIAGNOSIS — I5042 Chronic combined systolic (congestive) and diastolic (congestive) heart failure: Secondary | ICD-10-CM | POA: Diagnosis not present

## 2020-11-05 DIAGNOSIS — L309 Dermatitis, unspecified: Secondary | ICD-10-CM

## 2020-11-05 DIAGNOSIS — Z794 Long term (current) use of insulin: Secondary | ICD-10-CM

## 2020-11-05 DIAGNOSIS — N201 Calculus of ureter: Secondary | ICD-10-CM

## 2020-11-05 MED ORDER — ESZOPICLONE 1 MG PO TABS: 1.0000 mg | ORAL_TABLET | Freq: Every evening | ORAL | 1 refills | Status: DC | PRN

## 2020-11-05 MED ORDER — TRIAMCINOLONE ACETONIDE 0.1 % EX CREA: 1.0000 "application " | TOPICAL_CREAM | Freq: Two times a day (BID) | CUTANEOUS | 0 refills | Status: DC

## 2020-11-05 NOTE — Progress Notes (Addendum)
Virtual Visit via Telephone Note  I connected with Marco [name] on 11/05/20 at 4:40 p.m by telephone and verified that I am speaking with the correct person using two identifiers.  Location: Patient: home Provider: office  Participants: Myself Patient CMA: Ms. Marco [name] interpreter:    I discussed the limitations, risks, security and privacy concerns of performing an evaluation and management service by telephone and the availability of in person appointments. I also discussed with the patient that there may be a patient responsible charge related to this service. The patient expressed understanding and agreed to proceed.   History of Present Illness: Pt with history ofperinephric abscess (12/2019 s/p drainage - E.coli),HTN,DM2 with neuropathy, and nephropathy, CKD stage III, obesity, nonobstructive CAD/cardiac amyloid, chronic combined CHFEF 55-60%in 12/2019 and 25-30% 05/2020,PAF s/p ablation 2017 (Xarelto d/c due to recurrent anemia),OSA on VPAP,on Cosentyx andfollowed by [facility]ology in [facility] for psoriasis/gout/nonspec arthritis, chronic insomnia. On narcotics through [facility] for chronic pain inchest/back/shoulders/neck.  Will be having ESWL for 4 mm stone in LT ureter by urology.  Urology request medical clearance and needs to be off ASA for several days No issues waking up from anesthesia in past Using VPAP consistently every night No fever.  Endorses dysuria x few wks.  No hematuria.  No flare of back pain.   DM: Saw his endocrinologist [name] last wk.  A1C was 8.8.  He recommend GLP-1 but pt declined because he was told it can cause weight loss.  He reports already losing 30 pounds since his hospitalization in November 2021.  In looking at his weight recordings and heart chart, he has lost 15 pounds since July of last year. - Humalog increased to 75 mg once a day instead of adding GLP-1 -Checks BS BID.  This a.m was 154.  Highest reading in past  several days has been 170.   Doing okay with eating habits  HTN/CHF:  Off Entresto, Hydralazine/Imdur and jardiance.  Jardiance causes nausea and vomiting.  He was taken off Entresto due to low BP and AKI  In 06/2020.  Remains off this med.  He saw his cardiologist [name] in January.  Hydralazine and Imdur were restarted as blood pressure was elevated.  He plans to restart Entresto once his kidney function stabilizes.  However patient called his office on 09/12/2020 complaining of shortness of breath and chest pain since restarting the hydralazine and Imdur.  He was advised to stop the medications. Endorses SOB with walking and dressing.  No PND. No LE edema currently.   On Furosemide 80 mg daily BP has been 130s/70s.   CKD 3: Creatinine had been improving.  Last creatinine in January was 1.53 with GFR of 50.  His GFR has ranged 40s-50s.  Some stiffness and pain in legs and arms.  Rheumatologist stopped Prednisone and restart Cosentyx 2 mths ago.  Last saw her 2 wks ago  Insomnia: Reports serum no longer works for him.  Takes too long to kick in and he is unable to maintain sleep during the night.  He is on 8 mg.  He was on Ambien in the past and was taken off of that as it had stopped working for him also.  He is requesting to try a different sleep aid.  c/o finger pads of 3rd, 4th and 5th fingers on his left hand are still sore and peeling.  They itch, burn and stings.  He had seen our PA for the same on 10/17/2020.  He  was diagnosed with having cellulitis of the left thumb.  He was prescribed Bactrim antibiotics and Bactroban cream.  Reports that his fingers are no better.  I note the picture that she took of his hand    Outpatient Encounter Medications as of 11/05/2020  Medication Sig Note  . Accu-Chek Softclix Lancets lancets Use as instructed   . albuterol (VENTOLIN HFA) 108 (90 Base) MCG/ACT inhaler Inhale 2 puffs into the lungs every 6 (six) hours as needed for wheezing or shortness of  breath.   . allopurinol (ZYLOPRIM) 100 MG tablet Take 200 mg by mouth every evening.  08/21/2020: .  . aspirin (EQ ASPIRIN ADULT LOW DOSE) 81 MG EC tablet Take 1 tablet (81 mg total) by mouth daily. TAKE 1 TABLET BY MOUTH ONCE DAILY. SWALLOW WHOLE Please hold asa until you repeat blood work, resume if blood count stable and ok with you primary care doctor   . atorvastatin (LIPITOR) 40 MG tablet TAKE 1 TABLET BY MOUTH ONCE A DAY * NEEDS APPT BEFORE ANYMORE REFILLS   . carvedilol (COREG) 12.5 MG tablet TAKE 1 TABLET BY MOUTH TWICE DAILY WITH A MEAL   . cetirizine (ZYRTEC ALLERGY) 10 MG tablet Take 1 tablet (10 mg total) by mouth daily.   . Continuous Blood Gluc Sensor (FREESTYLE LIBRE 2 SENSOR) MISC 1 Device by Does not apply route every 14 (fourteen) days.   [place] SENSOREADY PEN 150 MG/ML SOAJ SMARTSIG:1 Pre-Filled Pen Syringe SUB-Q Every 4 Weeks   . diclofenac Sodium (VOLTAREN) 1 % GEL Apply 4 g topically 4 (four) times daily. (Patient taking differently: Apply 4 g topically 4 (four) times daily as needed (pain).)   . DULoxetine (CYMBALTA) 20 MG capsule TAKE 1 CAPSULE BY MOUTH ONCE DAILY   . famotidine (PEPCID) 20 MG tablet TAKE 1 TABLET BY MOUTH 2 TIMES DAILY   . fluticasone (FLONASE) 50 MCG/ACT nasal spray Place 1 spray into both nostrils daily as needed for allergies or rhinitis.   . furosemide (LASIX) 80 MG tablet Take 1 tablet (80 mg total) by mouth daily.   [name] glucose blood (ACCU-CHEK GUIDE) test strip Use to test 4 times daily.   . hydrALAZINE (APRESOLINE) 25 MG tablet Take 1 tablet (25 mg total) by mouth 3 (three) times daily.   . Insulin Lispro Prot & Lispro (HUMALOG MIX 75/25 KWIKPEN) (75-25) 100 UNIT/ML Kwikpen Inject 75 Units into the skin daily with breakfast.   . isosorbide mononitrate (IMDUR) 30 MG 24 hr tablet Take 1 tablet (30 mg total) by mouth daily.   . mupirocin cream (BACTROBAN) 2 % Apply 1 application topically 2 (two) times daily.   . naloxone (NARCAN) nasal spray 4  mg/0.1 mL Place in nostril in the event of suspected overdose on Narcotic medication (Oxycodone) 08/21/2020: .  . oxyCODONE (OXY IR/ROXICODONE) 5 MG immediate release tablet Take 5 mg by mouth 3 (three) times daily as needed for moderate pain.    . polyethylene glycol (MIRALAX / GLYCOLAX) 17 g packet Take 17 g by mouth daily as needed for moderate constipation or severe constipation. 08/21/2020: .  [name] Potassium Chloride ER 20 MEQ TBCR Take 20 mEq by mouth daily.  08/21/2020: .  . potassium chloride SA (KLOR-CON) 20 MEQ tablet TAKE 1 TABLET (20 MEQ) BY MOUTH DAILY.   . ramelteon (ROZEREM) 8 MG tablet TAKE 1 TABLET BY MOUTH AT BEDTIME   . UNIFINE PENTIPS 32G X 4 MM MISC USE AS DIRECTED DAILY   . VYNDAMAX 61 MG CAPS  TAKE 1 CAPSULE (61 MG) BY MOUTH DAILY.    No facility-administered encounter medications on file as of 11/05/2020.      Observations/Objective: No direct observation done I reviewed the photos that were taken of the LT hand in note done by PA 10/17/2020  Assessment and Plan: 1. Preoperative evaluation to rule out surgical contraindication 2. Ureteral calculus Plan for EWSL by urology Prior to approving him for surgery, we will check UA to rule out UTI given his symptoms of dysuria x2 weeks.  I will also wait until he follows up with cardiology to make sure he is stable from his standpoint.  3. Dysuria - Urinalysis, Routine w reflex microscopic; Future - Urine Culture; Future  4. Essential hypertension Patient reports home blood pressure readings have been good.  Most recent reading in the system done on his recent visit with the endocrinologist was at goal at 110/70. - Comprehensive metabolic panel; Future - CBC; Future  5. Chronic combined systolic and diastolic CHF, NYHA class 2 (HCC) Continue furosemide and low-salt diet.  Continue carvedilol.  Keep follow-up appointment with cardiology next month.   6. Stage 3a chronic kidney disease ([place]) Kidney function improved slightly  over the past several months.  Recheck creatinine and GFR - Comprehensive metabolic panel; Future  7. Type 2 diabetes mellitus with other circulatory complication, with long-term current use of insulin (HCC) Recent A1c not at goal.  Changes made in his insulin by the endocrinologist.  8. Chronic insomnia Stop Rozerem. We will to trying a low-dose of Lunesta.  Advised to take the medication at bedtime and when he can devote at least 7 to 8 hours to sleep. - eszopiclone (LUNESTA) 1 MG TABS tablet; Take 1 tablet (1 mg total) by mouth at bedtime as needed for sleep. Take immediately before bedtime  Dispense: 30 tablet; Refill: 1  9. Hand dermatitis Questionably allergic/contact dermatitis versus a vascular issue.  Given his symptoms of itching and burning, I will try him with triamcinolone cream.  Advised patient that if it does not get better within 5 days of using the cream he needs to make an urgent care appointment for further evaluation   Follow Up Instructions: Post f/u with cardiology 11/19/2020.   I discussed the assessment and treatment plan with the patient. The patient was provided an opportunity to ask questions and all were answered. The patient agreed with the plan and demonstrated an understanding of the instructions.   The patient was advised to call back or seek an in-person evaluation if the symptoms worsen or if the condition fails to improve as anticipated.   Addendum:  Pt saw cardiology PA on 11/19/2020.  I sent an inbox message to the PA and [name] informing them of the plan urologic procedure by [facility] and inquiring whether patient is stable from a cardiac standpoint to proceed.  The cardiologist responded that it is okay to proceed.  I spent 27 minutes on this telephone encounter   Karle Plumber, MD

## 2020-11-06 ENCOUNTER — Ambulatory Visit: Payer: Medicare Other | Attending: Internal Medicine

## 2020-11-06 ENCOUNTER — Other Ambulatory Visit: Payer: Self-pay

## 2020-11-06 ENCOUNTER — Other Ambulatory Visit (HOSPITAL_COMMUNITY): Payer: Self-pay

## 2020-11-06 DIAGNOSIS — R300 Dysuria: Secondary | ICD-10-CM | POA: Diagnosis not present

## 2020-11-06 DIAGNOSIS — I10 Essential (primary) hypertension: Secondary | ICD-10-CM | POA: Diagnosis not present

## 2020-11-06 DIAGNOSIS — N1831 Chronic kidney disease, stage 3a: Secondary | ICD-10-CM

## 2020-11-06 MED FILL — TRIAMCINOLONE 0.1% CREAM: 0.1 | 14 days supply | Qty: 30 | Fill #0

## 2020-11-06 MED FILL — ESZOPICLONE 1 MG TABLET: 1 | 30 days supply | Qty: 30 | Fill #0

## 2020-11-07 LAB — URINALYSIS, ROUTINE W REFLEX MICROSCOPIC
Bilirubin, UA: NEGATIVE
Glucose, UA: NEGATIVE
Ketones, UA: NEGATIVE
Leukocytes,UA: NEGATIVE
Nitrite, UA: NEGATIVE
Protein,UA: NEGATIVE
RBC, UA: NEGATIVE
Specific Gravity, UA: 1.011 (ref 1.005–1.030)
Urobilinogen, Ur: 0.2 mg/dL (ref 0.2–1.0)
pH, UA: 5.5 (ref 5.0–7.5)

## 2020-11-07 LAB — CBC
Hematocrit: 37.4 % — ABNORMAL LOW (ref 37.5–51.0)
Hemoglobin: 12.6 g/dL — ABNORMAL LOW (ref 13.0–17.7)
MCH: 30.6 pg (ref 26.6–33.0)
MCHC: 33.7 g/dL (ref 31.5–35.7)
MCV: 91 fL (ref 79–97)
Platelets: 257 10*3/uL (ref 150–450)
RBC: 4.12 x10E6/uL — ABNORMAL LOW (ref 4.14–5.80)
RDW: 12.5 % (ref 11.6–15.4)
WBC: 8 10*3/uL (ref 3.4–10.8)

## 2020-11-07 LAB — COMPREHENSIVE METABOLIC PANEL
ALT: 7 IU/L (ref 0–44)
AST: 10 IU/L (ref 0–40)
Albumin/Globulin Ratio: 1.1 — ABNORMAL LOW (ref 1.2–2.2)
Albumin: 3.9 g/dL (ref 3.8–4.8)
Alkaline Phosphatase: 94 IU/L (ref 44–121)
BUN/Creatinine Ratio: 10 (ref 10–24)
BUN: 17 mg/dL (ref 8–27)
Bilirubin Total: 0.4 mg/dL (ref 0.0–1.2)
CO2: 23 mmol/L (ref 20–29)
Calcium: 9 mg/dL (ref 8.6–10.2)
Chloride: 101 mmol/L (ref 96–106)
Creatinine, Ser: 1.69 mg/dL — ABNORMAL HIGH (ref 0.76–1.27)
Globulin, Total: 3.6 g/dL (ref 1.5–4.5)
Glucose: 195 mg/dL — ABNORMAL HIGH (ref 65–99)
Potassium: 4.3 mmol/L (ref 3.5–5.2)
Sodium: 141 mmol/L (ref 134–144)
Total Protein: 7.5 g/dL (ref 6.0–8.5)
eGFR: 44 mL/min/{1.73_m2} — ABNORMAL LOW (ref >59)

## 2020-11-08 LAB — URINE CULTURE

## 2020-11-10 ENCOUNTER — Other Ambulatory Visit (HOSPITAL_COMMUNITY): Payer: Self-pay

## 2020-11-10 MED FILL — Furosemide Tab 80 MG: ORAL | 90 days supply | Qty: 90 | Fill #0 | Status: AC

## 2020-11-11 ENCOUNTER — Other Ambulatory Visit (HOSPITAL_COMMUNITY): Payer: Self-pay

## 2020-11-13 ENCOUNTER — Other Ambulatory Visit (HOSPITAL_COMMUNITY): Payer: Self-pay

## 2020-11-13 DIAGNOSIS — J9601 Acute respiratory failure with hypoxia: Secondary | ICD-10-CM | POA: Diagnosis not present

## 2020-11-14 ENCOUNTER — Telehealth: Payer: Self-pay | Admitting: Endocrinology

## 2020-11-14 NOTE — Telephone Encounter (Signed)
New message    [facility] Marco [name] is wanting to know how many times the pt is using his insulin a day Insulin Lispro Prot & Lispro (HUMALOG MIX 75/25 KWIKPEN) (75-25) 100 UNIT/ML Kwikpen.  Pt has never told them.

## 2020-11-15 NOTE — Telephone Encounter (Signed)
Spoke with Bridgett to give her an update on the pt but she stated that the information was no longer needed bc they did not accept his insurance.

## 2020-11-19 ENCOUNTER — Other Ambulatory Visit (HOSPITAL_COMMUNITY): Payer: Self-pay

## 2020-11-19 ENCOUNTER — Ambulatory Visit (HOSPITAL_COMMUNITY)
Admission: RE | Admit: 2020-11-19 | Discharge: 2020-11-19 | Disposition: A | Discharge status: Home / Self Care | Payer: Medicare Other | Source: Ambulatory Visit | Attending: Cardiology | Admitting: Cardiology

## 2020-11-19 ENCOUNTER — Other Ambulatory Visit: Payer: Self-pay

## 2020-11-19 ENCOUNTER — Encounter (HOSPITAL_COMMUNITY): Payer: Self-pay

## 2020-11-19 VITALS — BP 134/82 | HR 67 | Wt 200.2 lb

## 2020-11-19 DIAGNOSIS — Z794 Long term (current) use of insulin: Secondary | ICD-10-CM | POA: Diagnosis not present

## 2020-11-19 DIAGNOSIS — I130 Hypertensive heart and chronic kidney disease with heart failure and stage 1 through stage 4 chronic kidney disease, or unspecified chronic kidney disease: Secondary | ICD-10-CM | POA: Diagnosis not present

## 2020-11-19 DIAGNOSIS — D472 Monoclonal gammopathy: Secondary | ICD-10-CM | POA: Insufficient documentation

## 2020-11-19 DIAGNOSIS — Z79899 Other long term (current) drug therapy: Secondary | ICD-10-CM | POA: Insufficient documentation

## 2020-11-19 DIAGNOSIS — D649 Anemia, unspecified: Secondary | ICD-10-CM | POA: Diagnosis not present

## 2020-11-19 DIAGNOSIS — N151 Renal and perinephric abscess: Secondary | ICD-10-CM | POA: Diagnosis not present

## 2020-11-19 DIAGNOSIS — Z886 Allergy status to analgesic agent status: Secondary | ICD-10-CM | POA: Insufficient documentation

## 2020-11-19 DIAGNOSIS — F40240 Claustrophobia: Secondary | ICD-10-CM | POA: Insufficient documentation

## 2020-11-19 DIAGNOSIS — Z8719 Personal history of other diseases of the digestive system: Secondary | ICD-10-CM | POA: Diagnosis not present

## 2020-11-19 DIAGNOSIS — Z8249 Family history of ischemic heart disease and other diseases of the circulatory system: Secondary | ICD-10-CM | POA: Diagnosis not present

## 2020-11-19 DIAGNOSIS — N1832 Chronic kidney disease, stage 3b: Secondary | ICD-10-CM | POA: Insufficient documentation

## 2020-11-19 DIAGNOSIS — I5042 Chronic combined systolic (congestive) and diastolic (congestive) heart failure: Secondary | ICD-10-CM | POA: Diagnosis not present

## 2020-11-19 DIAGNOSIS — E1122 Type 2 diabetes mellitus with diabetic chronic kidney disease: Secondary | ICD-10-CM | POA: Insufficient documentation

## 2020-11-19 DIAGNOSIS — I4891 Unspecified atrial fibrillation: Secondary | ICD-10-CM | POA: Insufficient documentation

## 2020-11-19 DIAGNOSIS — Z888 Allergy status to other drugs, medicaments and biological substances status: Secondary | ICD-10-CM | POA: Diagnosis not present

## 2020-11-19 DIAGNOSIS — Z87891 Personal history of nicotine dependence: Secondary | ICD-10-CM | POA: Diagnosis not present

## 2020-11-19 DIAGNOSIS — Z9049 Acquired absence of other specified parts of digestive tract: Secondary | ICD-10-CM | POA: Insufficient documentation

## 2020-11-19 DIAGNOSIS — I2510 Atherosclerotic heart disease of native coronary artery without angina pectoris: Secondary | ICD-10-CM | POA: Diagnosis not present

## 2020-11-19 DIAGNOSIS — Z7982 Long term (current) use of aspirin: Secondary | ICD-10-CM | POA: Insufficient documentation

## 2020-11-19 LAB — BASIC METABOLIC PANEL
Anion gap: 6 (ref 5–15)
BUN: 15 mg/dL (ref 8–23)
CO2: 28 mmol/L (ref 22–32)
Calcium: 8.9 mg/dL (ref 8.9–10.3)
Chloride: 104 mmol/L (ref 98–111)
Creatinine, Ser: 1.5 mg/dL — ABNORMAL HIGH (ref 0.61–1.24)
GFR, Estimated: 51 mL/min — ABNORMAL LOW (ref >60)
Glucose, Bld: 155 mg/dL — ABNORMAL HIGH (ref 70–99)
Potassium: 3.8 mmol/L (ref 3.5–5.1)
Sodium: 138 mmol/L (ref 135–145)

## 2020-11-19 MED ORDER — FUROSEMIDE 80 MG PO TABS
ORAL_TABLET | ORAL | 3 refills | Status: DC
Start: 2020-11-19 — End: 2020-11-19
  Filled 2020-11-19: qty 45, 30d supply, fill #0

## 2020-11-19 MED ORDER — POTASSIUM CHLORIDE CRYS ER 20 MEQ PO TBCR
30.0000 meq | EXTENDED_RELEASE_TABLET | Freq: Every day | ORAL | 3 refills | Status: DC
  Filled 2020-11-19: qty 90, 60d supply, fill #0
  Filled 2021-02-04: qty 90, 60d supply, fill #1
  Filled 2021-04-23: qty 90, 60d supply, fill #2
  Filled 2021-06-06: qty 90, 60d supply, fill #3

## 2020-11-19 MED ORDER — FUROSEMIDE 40 MG PO TABS
40.0000 mg | ORAL_TABLET | Freq: Every evening | ORAL | 3 refills | Status: DC
  Filled 2020-11-19: qty 90, 90d supply, fill #0
  Filled 2021-01-01 – 2021-02-04 (×2): qty 90, 90d supply, fill #1
  Filled 2021-05-06: qty 90, 90d supply, fill #2
  Filled 2021-07-29: qty 90, 90d supply, fill #3

## 2020-11-19 MED ORDER — FUROSEMIDE 80 MG PO TABS
80.0000 mg | ORAL_TABLET | Freq: Every morning | ORAL | 3 refills | Status: DC
  Filled 2020-11-19 – 2021-02-04 (×3): qty 90, 90d supply, fill #0
  Filled 2021-05-06: qty 90, 90d supply, fill #1
  Filled 2021-07-29: qty 90, 90d supply, fill #2

## 2020-11-19 MED FILL — Tafamidis Cap 61 MG: ORAL | 30 days supply | Qty: 30 | Fill #0 | Status: CN

## 2020-11-19 MED FILL — Secukinumab Subcutaneous Soln Auto-injector 150 MG/ML: SUBCUTANEOUS | 28 days supply | Qty: 3 | Fill #0 | Status: CN

## 2020-11-19 NOTE — Patient Instructions (Addendum)
INCREASE Lasix to 80 mg in the AM and 40 mg in the PM INCREASE Potassium to 30 meq daily  Labs today We will only contact you if something comes back abnormal or we need to make some changes. Otherwise no news is good news!  Labs needed in 7-10 days   Your physician recommends that you schedule a follow-up appointment in: 1 week for RN visit and labs   At the [facility], you and your health needs are our priority. As part of our continuing mission to provide you with exceptional heart care, we have created designated Provider Care Teams. These Care Teams include your primary Cardiologist (physician) and Advanced Practice Providers (APPs- Physician Assistants and Nurse Practitioners) who all work together to provide you with the care you need, when you need it.   You may see any of the following providers on your designated Care Team at your next follow up: [name] Dr Glori Bickers . Dr Loralie Champagne . Dr Vickki Muff . Darrick Grinder, NP . Lyda Jester, [place] . Audry Riles, PharmD   Please be sure to bring in all your medications bottles to every appointment.   Do the following things EVERYDAY: 1) Weigh yourself in the morning before breakfast. Write it down and keep it in a log. 2) Take your medicines as prescribed 3) Eat low salt foods--Limit salt (sodium) to 2000 mg per day.  4) Stay as active as you can everyday 5) Limit all fluids for the day to less than 2 liters Please see our updated No Show and Same Day Appointment Cancellation Policy attached to your AVS.  If you have any questions or concerns before your next appointment please send [place] a message through [place] or call our office at [phone].    TO LEAVE A MESSAGE FOR THE NURSE SELECT OPTION 2, PLEASE LEAVE A MESSAGE INCLUDING: . YOUR NAME . DATE OF BIRTH . CALL BACK NUMBER . REASON FOR CALL**this is important as we prioritize the call backs  YOU WILL RECEIVE A CALL BACK  THE SAME DAY AS LONG AS YOU CALL BEFORE 4:00 PM

## 2020-11-19 NOTE — Progress Notes (Addendum)
Advanced Heart Failure Clinic Note   Date:  11/19/2020   ID:  Marco [name], DOB 1956-07-01, MRN [id number]  Location: Home  Provider location: [facility] Type of Visit: Established patient  PCP:  Marco Pier, MD  Cardiologist:  Marco Him, MD Primary HF: Marco [name]  Chief Complaint: Heart Failure follow-up   History of Present Illness:  Marco [name] is a 65 y.o. 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  He was seen by Marco [name] in 8/19 for SOB and found to have a drop in EF to 35-40% with inferior hypokinesis. Cardiac cath 10/19 showed mild nonobstructive CAD  LVEDP 28, EF 25-30%.   PYP 11/19  (grade 2, H/CLL equal 1.94) read as strongly suggestive TTR amyloidosis. However Marco [name] personally reviewed and did not think study was positive. Myleloma panel with MGUS, discussed with Marco [name] who recommend follow up labwork for common, non-pathologic findings. Did not think he had myeloma  Repeat PYP 12/20 H/CLL = 1.10 The study is equivocal for TTR amyloidosis (visual score of 1 or H/CL ratio 1-1.5).   Echo 2/20 EF 40-45% RV normal   Echo 3/21 EF 55-60%   Admitted 4/21 for urosepsis and AKI with peri-nephric abscess. Creatinine 1.5 -> 5.6. Meds held. Hydrated and creatinine 1.7 on d/c.   Readmitted 5/21 for pyelonephritis. Entresto stopped. Had drain placed.  CT 05/18/20 with large capsular hematoma of R kidney and hepatic abscess  Repeat echo 10/21 EF 25-30%  In 10/21, we restarted Entresto. Had labs with PCP and creatinine back up 1.3 -> 3.1 so Entresto held and lasix cut back to 80 daily.    Last clinic visit was 1/22. Was feeling better. Denied CP and SOB. Echo was repeated, LVEF ~ 45-50% RV ok. His  BP was mildly elevated and he was restarted on hydralazine 25 tid and Imdur 30 daily, however did not tolerate due to HAs and dizziness. Meds were discontinued.   He returns for routine f/u. Reports being more SOB than usual. NYHA Class III. ReDs Clip elevated at 41%. BP 134/82. EKG shows NSR. HR 60s. No CP. Reports full med compliance. Good urinary response to PO Lasix. Tries to avoid eating out and adding salt to food.   Also of note, he denies any further hematuria off of Xarelto and refuses to restart anticoagulation. He understands risk of CVA. We discussed referral to EP for Watchman's device and will consider.     Studies:   [facility] 11/2015 showed minimal CAD, mild calcification noted extraluminally, normal LVF, normal LVEDP. CPX 06/2016 with limited interpretation due to submaximal effort during exercise, difficult to interpret, also of note was very intolerant to discomfort from several processes during the CPX procedure today (i.e.motion on bike, electrode removal, etc).  - Echo 09/03/16 EF 45-50% with global HK, grade 2 DD, elevated LV filling pressure. CTA 09/02/16 was negative for PE.  - Nuclear stress test 12/2016 was done for atypical CP showing small defect of severe severity in apical septal and apex location, no ischemia, EF 44% - suspected artifact.  - Echo 01/20/17 EF 50-55%, hypokinesis of apical inferoseptal myocardium, grade 1 diastolic dysfunction, mild MR, no sig change from prior.  - Event monitor 12/2016 showed NSR with occasional PVCs, otherwise benign.  -  ETT 02/2017 was nondiagnostic due to 20 seconds of exercise and severe SOB. -Cardiac cath 05/19/18 showed mild nonobstructive CAD (see below), mod-severely elevated LVEDP 28, EF 25-30%. - Echo 05/19/18 LVEF 25-30%, Grade 1 DD, Trivial MR.  - PYP 11/19 Visual and quantitative assessment (grade 2, H/CLL equal 1.94) are strongly suggestive of transthyretin amyloidosis. - Echo 1/22, LVEF 45-50%. RV ok.    LHC 05/19/18  - Ost 2nd  Diag lesion is 35% stenosed.  Prox LAD to Mid LAD lesion is 20% stenosed.  Ost Cx to Prox Cx lesion is 10% stenosed.  There is severe left ventricular systolic dysfunction.  LV end diastolic pressure is moderately elevated. The left ventricular ejection fraction is 25-35% by visual estimate. - LVEDP 28  CPX 06/19/2016 - Limited due to submaximal effort and poor amount of information.  FVC 2.35 (65%)    FEV1 1.94 (66%)     FEV1/FVC 82 (102%)     MVV 117 (86%) Post-Exercise PFTs--------- Not performed due to sudden termination of exercise Exercise Time:  1:56      Watts: 10 RPE: 17 Resting HR: 70 Peak HR: 84  (50% age predicted max HR) BP rest: 148/82 BP peak: 156/84 Peak VO2: 9.7 (50% predicted peak VO2) VE/VCO2 slope: 30 OUES: 1.34 Peak RER: 0.97 Ventilatory Threshold: CANNOT BE DETERMINED Peak RR 78 Peak Ventilation: 28.8 VE/MVV: 25% PETCO2 at peak: 36 O2pulse: 11  (79% predicted O2pulse)   Marco [name] denies symptoms worrisome for COVID 19.   Past Medical History:  Diagnosis Date  . Benign colon polyp 08/01/2013   [facility] in [place]. large base tranverse colon polyp was biopsied. polyp was benign with minimal surface hyperplastic change.  . Chronic combined systolic and diastolic CHF (congestive heart failure) ([place])   . Chronic pain   . CKD (chronic kidney disease), stage III ([place])   . Diabetes mellitus without complication ([place])   . Diverticulosis   . Esophageal hiatal hernia 07/29/2013   confirmed on EGD   . Esophageal stricture   . Essential hypertension   . Gastritis 07/29/2013   confirmed on EGD, bx done an negative for intestinal metaplasia, dsyplasia or H. pylori. normal gastric emptying study done 07/13/2013.  [name] GERD (gastroesophageal reflux disease)   . Morbid obesity ([place])   . Non-obstructive CAD    a. 02/2013 Cath ([place]): nonobs dzs;  b. 09/2014 Myoview ([place]): EF 55%, no ischemia;  c. Cath 05/2018 mild CAD no  obstruction.  . Persistent atrial fibrillation ([place])    a. 02/2013 s/p rfca in [place], [place]-->prev on Xarelto, d/c'd 2/2 anemia, ? GIB.  [name] Rheumatoid arthritis ([place])   . Sigmoid diverticulosis 08/01/2013   confirmed on colonscopy. record scanned into chart   Past Surgical History:  Procedure Laterality Date  . CARDIAC CATHETERIZATION  05/2014   ablation for atrial fibrillation  . CARDIAC CATHETERIZATION N/A 11/16/2015   Procedure: Left Heart Cath and Coronary Angiography;  Surgeon: Leonie Man, MD;  Location: [facility];  Service: Cardiovascular;  Laterality: N/A;  . COLON RESECTION  09/2013   due to large, abnormal polpy. non cancerous per patient.   . COLON SURGERY  09/2014   colon resection   . IR PERC PLEURAL DRAIN W/INDWELL CATH W/IMG GUIDE  01/07/2020  . LEFT HEART CATH AND CORONARY ANGIOGRAPHY N/A 05/19/2018   Procedure: LEFT HEART CATH AND CORONARY ANGIOGRAPHY;  Surgeon: Wellington Hampshire, MD;  Location: [facility];  Service: Cardiovascular;  Laterality:  N/A;     Current Outpatient Medications  Medication Sig Dispense Refill  . Accu-Chek Softclix Lancets lancets USE AS INSTRUCTED 100 each 12  . albuterol (VENTOLIN HFA) 108 (90 Base) MCG/ACT inhaler INHALE 2 PUFFS INTO THE LUNGS EVERY 6 HOURS AS NEEDED FOR WHEEZING OR SHORTNESS OF BREATH. 8.5 g 1  . allopurinol (ZYLOPRIM) 100 MG tablet Take 200 mg by mouth every evening.     [name] aspirin (EQ ASPIRIN ADULT LOW DOSE) 81 MG EC tablet Take 1 tablet (81 mg total) by mouth daily. TAKE 1 TABLET BY MOUTH ONCE DAILY. SWALLOW WHOLE Please hold asa until you repeat blood work, resume if blood count stable and ok with you primary care doctor    . atorvastatin (LIPITOR) 40 MG tablet TAKE 1 TABLET BY MOUTH ONCE A DAY * NEEDS APPT BEFORE ANYMORE REFILLS 15 tablet 0  . carvedilol (COREG) 12.5 MG tablet TAKE 1 TABLET BY MOUTH TWO TIMES DAILY WITH MEALS 180 tablet 1  . cetirizine (ZYRTEC) 10 MG tablet TAKE 1 TABLET BY MOUTH ONCE A  DAY 30 tablet 11  . Continuous Blood Gluc Receiver (FREESTYLE LIBRE 2 READER) DEVI USE AS DIRECTED 1 each 1  . Continuous Blood Gluc Sensor (FREESTYLE LIBRE 2 SENSOR) MISC USE AS DIRECTED EVERY 14 DAYS 6 each 3  . COSENTYX SENSOREADY PEN 150 MG/ML SOAJ SMARTSIG:1 Pre-Filled Pen Syringe SUB-Q Every 4 Weeks    . diclofenac Sodium (VOLTAREN) 1 % GEL Apply 4 g topically 4 (four) times daily. (Patient taking differently: Apply 4 g topically 4 (four) times daily as needed (pain).) 100 g 0  . DULoxetine (CYMBALTA) 20 MG capsule TAKE 1 CAPSULE BY MOUTH ONCE A DAY 90 capsule 0  . eszopiclone (LUNESTA) 1 MG TABS tablet TAKE 1 TABLET BY MOUTH EVERY NIGHT IMMEDIATELY BEFORE BEDTIME AS NEEDED FOR SLEEP 30 tablet 1  . famotidine (PEPCID) 20 MG tablet TAKE 1 TABLET BY MOUTH 2 TIMES DAILY 60 tablet 2  . fluticasone (FLONASE) 50 MCG/ACT nasal spray PLACE 1 SPRAY INTO BOTH NOSTRILS DAILY AS NEEDED FOR ALLERGIES OR RHINITIS. 16 g 1  . furosemide (LASIX) 80 MG tablet TAKE 1 TABLET BY MOUTH ONCE A DAY 90 tablet 3  . glucose blood test strip USE TO TEST 4 TIMES DAILY. (Patient taking differently: USE TO TEST 4 TIMES DAILY.) 400 strip 3  . Insulin Lispro Prot & Lispro (HUMALOG MIX 75/25 KWIKPEN) (75-25) 100 UNIT/ML Kwikpen Inject 75 Units into the skin daily with breakfast. 144 mL 3  . mupirocin cream (BACTROBAN) 2 % Apply 1 application topically 2 (two) times daily. 15 g 0  . naloxone (NARCAN) nasal spray 4 mg/0.1 mL Place in nostril in the event of suspected overdose on Narcotic medication (Oxycodone) 1 each 0  . oxyCODONE (OXY IR/ROXICODONE) 5 MG immediate release tablet Take 5 mg by mouth 3 (three) times daily as needed for moderate pain.     . polyethylene glycol (MIRALAX / GLYCOLAX) 17 g packet Take 17 g by mouth daily as needed for moderate constipation or severe constipation. 14 each 0  . potassium chloride SA (KLOR-CON) 20 MEQ tablet TAKE 1 TABLET BY MOUTH DAILY. 90 tablet 3  . Secukinumab 150 MG/ML SOAJ INJECT  ONE PEN SUBCUTANEOUSLY EVERY 4 WEEKS. REFRIGERATE. ALLOW 15 TO 30 MINUTES AT ROOM TEMP PRIOR TO ADMINISTRATION. 3 mL 1  . Tafamidis 61 MG CAPS TAKE 1 CAPSULE (61 MG) BY MOUTH DAILY. 30 capsule 11  . triamcinolone (KENALOG) 0.1 % APPLY TO THE AFFECTED  AREA(S) TOPICALLY 2 TIMES DAILY AS DIRECTED 30 g 0  . UNIFINE PENTIPS 32G X 4 MM MISC USE AS DIRECTED DAILY 100 each 0   No current facility-administered medications for this encounter.    Allergies:   Nsaids and Jardiance [empagliflozin]   Social History:  The patient  reports that he quit smoking about 12 years ago. His smoking use included cigarettes. He has a 5.00 pack-year smoking history. He has never used smokeless tobacco. He reports that he does not drink alcohol and does not use drugs.   Family History:  The patient's family history includes COPD in his father and mother; Diabetes in his father and mother; Heart Problems in his father; Hypertension in his father and mother; Lung cancer in his mother.   ROS:  Please see the history of present illness.   All other systems are personally reviewed and negative.   Vitals:   11/19/20 1411  BP: 134/82  Pulse: 67  SpO2: 96%  Weight: 90.8 kg (200 lb 3.2 oz)    PHYSICAL EXAM: ReDS Clip 41%  General:  Well appearing, moderately obese. No respiratory difficulty HEENT: normal Neck: supple.  JVD 10 cm. Carotids 2+ bilat; no bruits. No lymphadenopathy or thyromegaly appreciated. Cor: PMI nondisplaced. Regular rate & rhythm. No rubs, gallops or murmurs. Lungs: clear Abdomen:  Obese, soft, nontender, nondistended. No hepatosplenomegaly. No bruits or masses. Good bowel sounds. Extremities: no cyanosis, clubbing, rash, edema Neuro: alert & oriented x 3, cranial nerves grossly intact. moves all 4 extremities w/o difficulty. Affect pleasant.   Recent Labs: 12/09/2019: TSH 1.102 05/24/2020: Magnesium 1.8 08/21/2020: B Natriuretic Peptide 106.4 11/06/2020: ALT 7; BUN 17; Creatinine, Ser 1.69;  Hemoglobin 12.6; Platelets 257; Potassium 4.3; Sodium 141  Personally reviewed   Wt Readings from Last 3 Encounters:  11/19/20 90.8 kg (200 lb 3.2 oz)  10/31/20 97.6 kg (215 lb 3.2 oz)  10/17/20 98.9 kg (218 lb)     ASSESSMENT AND PLAN:  1. Chronic combined systolic and diastolic CHF - Echo 67/3/41 LVEF 25-30%, Grade 1 DD, Trivial MR.  - Echo 2/20 40-45%  RV ok. No need for ICD currently.  - Echo 3/21 EF 55-60% -> complete recovery - Echo 05/16/20 EF 25-30% (drop in EF after recent illness) - Echo 08/22/19 w/ improved EF 45-50%.  RV normal.   - Very mild non-obstructive CAD on cath 05/19/18,  NICM  - PYP scan 11/19 read as + for Cardiac amyloidosis. But reviewed by Marco [name], and not 937% certain it is positive. Genetic testing negative. Myeloma Panel with MGUS, no myeloma. Now on Tafamadis.  - Repeat PYP 1/21. Reviewed personally. Read as equivocal. Felt negative by Marco [name]. - Ideally, would get cMRI, but limited due to claustrophobia.  - Volume overloaded today w/ increased dyspnea, NYHA Class II-early III. ReDs Clip 41% -  Increase Lasix to 80 qam/ 40 mg qpm. Increase KCl to 30 meQ daily   - Off Entresto due to AKI and perinephric hematoma. Continue to hold - did not tolerate hydralazine/ imdur due to HA and dizziness  - Continue coreg to 12.5 mg BID  - Off Jardiance - Check BMP today and again in 7 days + RN Visit in 7 days for repeat ReDs Clip  - discussed low sodium diet and fluid restriction    2. Cardiac Amyloidosis - By PYP scan 06/29/18. Grade 2, H/CLL equal 1.94. See discussion above - Genetic testing negative - Repeat PYP 1/21. Reviewed personally. Read as equivocal.  -  Has subsequently been started on tafamadis.  - No change today   3. Nonobstructive CAD - By cath 05/2018 as above - denies CP  - Continue ASA . Dr Radford [name] managing lipids  4. CKD stage IIIb - Creatinine labile 1.3 -> 3.1 -> 1.8->1.7 - Check BMP today ana again in 7 days w/ Lasix increase     5. History of atrial fibrillation - s/p RFCA 2017. No recurrence - Previously on Xarelto, stopped due to anemia and unknown blood loss and h/o renal capsular hematoma. - CHA2DS2/VASc is at least 4. - He refuses to restart anticoagulation.  We discussed referral to EP for Watchman's device and will consider. He was provided pt information    6. DM2 - Per PCP - Off Jardiance as he could not tolerate  7. R perinephric abscess 5/21 - s/p drainage - CT with large capsular hematoma - followed by Urology.   F/u w/ APP in 4-6 weeks    Signed, Lyda Jester, [name]  11/19/2020 2:16 PM  [facility] [place] [address] [phone] (office) [phone] (fax)

## 2020-11-20 ENCOUNTER — Other Ambulatory Visit (HOSPITAL_COMMUNITY): Payer: Self-pay

## 2020-11-21 ENCOUNTER — Other Ambulatory Visit (HOSPITAL_COMMUNITY): Payer: Self-pay

## 2020-11-21 MED FILL — Secukinumab Subcutaneous Soln Auto-injector 150 MG/ML: SUBCUTANEOUS | 30 days supply | Qty: 1 | Fill #0 | Status: AC

## 2020-11-21 MED FILL — Tafamidis Cap 61 MG: ORAL | 30 days supply | Qty: 30 | Fill #0 | Status: AC

## 2020-11-22 ENCOUNTER — Other Ambulatory Visit (HOSPITAL_COMMUNITY): Payer: Self-pay

## 2020-11-26 ENCOUNTER — Other Ambulatory Visit (HOSPITAL_COMMUNITY): Payer: Self-pay

## 2020-11-26 ENCOUNTER — Other Ambulatory Visit: Payer: Self-pay | Admitting: Cardiology

## 2020-11-26 ENCOUNTER — Other Ambulatory Visit: Payer: Self-pay

## 2020-11-26 ENCOUNTER — Other Ambulatory Visit: Payer: Self-pay | Admitting: Internal Medicine

## 2020-11-26 ENCOUNTER — Ambulatory Visit (HOSPITAL_COMMUNITY)
Admission: RE | Admit: 2020-11-26 | Discharge: 2020-11-26 | Disposition: A | Discharge status: Home / Self Care | Payer: Medicare Other | Source: Ambulatory Visit | Attending: Internal Medicine | Admitting: Internal Medicine

## 2020-11-26 DIAGNOSIS — I5042 Chronic combined systolic (congestive) and diastolic (congestive) heart failure: Secondary | ICD-10-CM | POA: Diagnosis not present

## 2020-11-26 LAB — BASIC METABOLIC PANEL
Anion gap: 9 (ref 5–15)
BUN: 15 mg/dL (ref 8–23)
CO2: 29 mmol/L (ref 22–32)
Calcium: 9 mg/dL (ref 8.9–10.3)
Chloride: 102 mmol/L (ref 98–111)
Creatinine, Ser: 1.63 mg/dL — ABNORMAL HIGH (ref 0.61–1.24)
GFR, Estimated: 46 mL/min — ABNORMAL LOW (ref >60)
Glucose, Bld: 213 mg/dL — ABNORMAL HIGH (ref 70–99)
Potassium: 4.1 mmol/L (ref 3.5–5.1)
Sodium: 140 mmol/L (ref 135–145)

## 2020-11-26 MED ORDER — TRIAMCINOLONE ACETONIDE 0.1 % EX CREA
TOPICAL_CREAM | CUTANEOUS | 0 refills | Status: DC
  Filled 2020-11-26: qty 30, 7d supply, fill #0

## 2020-11-26 MED FILL — Eszopiclone Tab 1 MG: ORAL | 30 days supply | Qty: 30 | Fill #0 | Status: CN

## 2020-11-26 MED FILL — Famotidine Tab 20 MG: ORAL | 30 days supply | Qty: 60 | Fill #0 | Status: AC

## 2020-11-26 NOTE — Progress Notes (Signed)
Patient seen in office for 1 week nurse visit. REDS clip reading 31%. Patient reports he feels like his fluid is down and overall feels "pretty good". Labs drawn today. No changes for now until lab work comes back per Amy Clegg,NP.

## 2020-11-27 ENCOUNTER — Telehealth: Payer: Self-pay | Admitting: Internal Medicine

## 2020-11-27 NOTE — Telephone Encounter (Signed)
Called patient and LVM to return call and schedule [name] virtual appt with [facility] for tomorrow since [facility] does not have any openings.   Copied from [place] [id number]. Topic: General - Other >> Nov 26, 2020  2:33 PM Pawlus, Marco [name] wrote: Reason for CRM: Pt believes he has [name] sinus infection, wanted to know if anything could be sent in since he he was not able to get an appt until June. Please advise.

## 2020-11-28 ENCOUNTER — Encounter (INDEPENDENT_AMBULATORY_CARE_PROVIDER_SITE_OTHER): Payer: Self-pay | Admitting: Primary Care

## 2020-11-28 ENCOUNTER — Telehealth: Payer: Self-pay | Admitting: Cardiology

## 2020-11-28 ENCOUNTER — Ambulatory Visit: Payer: Medicare Other | Admitting: Podiatry

## 2020-11-28 ENCOUNTER — Telehealth (INDEPENDENT_AMBULATORY_CARE_PROVIDER_SITE_OTHER): Payer: Medicare Other | Admitting: Primary Care

## 2020-11-28 ENCOUNTER — Telehealth: Payer: Self-pay | Admitting: Internal Medicine

## 2020-11-28 ENCOUNTER — Other Ambulatory Visit: Payer: Self-pay

## 2020-11-28 ENCOUNTER — Other Ambulatory Visit: Payer: Self-pay | Admitting: Urology

## 2020-11-28 ENCOUNTER — Other Ambulatory Visit (HOSPITAL_COMMUNITY): Payer: Self-pay

## 2020-11-28 DIAGNOSIS — J329 Chronic sinusitis, unspecified: Secondary | ICD-10-CM | POA: Diagnosis not present

## 2020-11-28 DIAGNOSIS — E1142 Type 2 diabetes mellitus with diabetic polyneuropathy: Secondary | ICD-10-CM

## 2020-11-28 DIAGNOSIS — N201 Calculus of ureter: Secondary | ICD-10-CM

## 2020-11-28 MED ORDER — AMOXICILLIN-POT CLAVULANATE 875-125 MG PO TABS
1.0000 | ORAL_TABLET | Freq: Two times a day (BID) | ORAL | 0 refills | Status: DC
  Filled 2020-11-28: qty 20, 10d supply, fill #0

## 2020-11-28 MED ORDER — DM-GUAIFENESIN ER 30-600 MG PO TB12
1.0000 | ORAL_TABLET | Freq: Two times a day (BID) | ORAL | 1 refills | Status: DC | PRN
  Filled 2020-11-28: qty 30, 15d supply, fill #0

## 2020-11-28 MED ORDER — OXYMETAZOLINE HCL 0.05 % NA SOLN
1.0000 | Freq: Two times a day (BID) | NASAL | 0 refills | Status: AC
  Filled 2020-11-28: qty 14.7, 74d supply, fill #0
  Filled 2020-11-30: qty 30, 10d supply, fill #0
  Filled 2021-08-06: qty 30, 150d supply, fill #0

## 2020-11-28 MED ORDER — ATORVASTATIN CALCIUM 40 MG PO TABS
40.0000 mg | ORAL_TABLET | Freq: Every day | ORAL | 0 refills | Status: DC
  Filled 2020-11-28: qty 45, 45d supply, fill #0

## 2020-11-28 NOTE — Progress Notes (Signed)
Nose if stuffy primarily left nostril- unable to blow nothing comes out.. nose has been like this for a while Was prescribed flonase and told to get saline- not helping  Starting to have pain in head and jaws  Worse at night especially when using his CPAP Denies cough

## 2020-11-28 NOTE — Telephone Encounter (Signed)
Pt's medication was sent to pt's pharmacy as requested. Confirmation received.  °

## 2020-11-28 NOTE — Telephone Encounter (Signed)
*  STAT* If patient is at the pharmacy, call can be transferred to refill team.   1. Which medications need to be refilled? (please list name of each medication and dose if known)  atorvastatin (LIPITOR) 40 MG tablet  2. Which pharmacy/location (including street and city if local pharmacy) is medication to be sent to? [facility]  3. Do they need a 30 day or 90 day supply? 90 day   Patient is out of medication. He is scheduled 01/08/2021

## 2020-11-28 NOTE — Telephone Encounter (Signed)
Copied from [place] [id number]. Topic: General - Other >> Nov 28, 2020  3:55 PM Celene Kras wrote: Reason for CRM: PT called in very upset. Pt states that he has tried to get an appt with PCP, but that she is scheduled out too far. Pt advised me that he was set up with an appt with [facility]. He states that he arrived at the office and was turned away due to congestion. Pts visit was turned into a virtual visit and pt feels that the provider did not listen to him or help him figure out a plan for his care. He states that he was told by the provider that she would have her NP give him a call back so that they could set up a plan of care. Pt states that he is upset because he does not feel that his care is being taken seriously. Please advise.

## 2020-11-29 ENCOUNTER — Other Ambulatory Visit (HOSPITAL_COMMUNITY): Payer: Self-pay

## 2020-11-29 ENCOUNTER — Ambulatory Visit (INDEPENDENT_AMBULATORY_CARE_PROVIDER_SITE_OTHER): Payer: Medicare Other | Admitting: Podiatry

## 2020-11-29 DIAGNOSIS — M2041 Other hammer toe(s) (acquired), right foot: Secondary | ICD-10-CM | POA: Diagnosis not present

## 2020-11-29 DIAGNOSIS — B351 Tinea unguium: Secondary | ICD-10-CM | POA: Diagnosis not present

## 2020-11-29 DIAGNOSIS — M79674 Pain in right toe(s): Secondary | ICD-10-CM

## 2020-11-29 DIAGNOSIS — E1142 Type 2 diabetes mellitus with diabetic polyneuropathy: Secondary | ICD-10-CM

## 2020-11-29 DIAGNOSIS — M79675 Pain in left toe(s): Secondary | ICD-10-CM | POA: Diagnosis not present

## 2020-11-29 MED ORDER — ALLOPURINOL 100 MG PO TABS
200.0000 mg | ORAL_TABLET | Freq: Every day | ORAL | 5 refills | Status: DC
  Filled 2020-11-29: qty 60, 30d supply, fill #0
  Filled 2021-01-01: qty 60, 30d supply, fill #1
  Filled 2021-02-04: qty 60, 30d supply, fill #2
  Filled 2021-03-06: qty 60, 30d supply, fill #3
  Filled 2021-04-05: qty 60, 30d supply, fill #4
  Filled 2021-05-06: qty 60, 30d supply, fill #5

## 2020-11-30 ENCOUNTER — Other Ambulatory Visit (HOSPITAL_COMMUNITY): Payer: Self-pay

## 2020-11-30 NOTE — Telephone Encounter (Signed)
Spoke to patient and he states that the provider called him back and medication was called in. Patient states he will pick up medication today and I advised him to give [place] a call back if the medication does not help him.     Copied from [place] [phone]. Topic: General - Other >> Nov 28, 2020  5:17 PM Pawlus, Brayton Layman A wrote: Reason for CRM: Pt stated he is still waiting for his virtual visit, appt was scheduled for 3:30pm. Please CB.

## 2020-12-02 NOTE — Progress Notes (Addendum)
Virtual Visit VIDEO I connected with [name], on 12/02/2020 at 4:24 PM by virtual link sent to log on  Consent: I discussed the limitations, risks, security and privacy concerns of performing an evaluation and management service by telephone and the availability of in person appointments. I also discussed with the patient that there may be a patient responsible charge related to this service. The patient expressed understanding and agreed to proceed.   Location of Patient: Home  Location of Provider: [facility]   Persons participating in Telemedicine visit: [name] nurse practitioner   History of Present Illness: Marco [name] is a 65 year old male actually irritated and frustrated for having a virtual visit when he wanted to be seen in person.  It would be virtually impossible to see or examine his problem via telemetry/virtual.  He complains of nose  left nostril-stuffy and when he tries to blow nothing comes out.  He admits this has been going on with his nose  like this for a while.  He was prescribed Flonase no relief and try using saline nasal spray not helping.  Pain has moved across his head and down into his jaws.  This becomes worse at night when he is using his CPAP.  Asked patient to palpate frontal sinuses maxillary sinuses and left nose from under both ears to the clavicle for any tenderness and swelling.  Maxillary tenderness and soreness lymph nodes cervical chain.  The other problem was the numbness and tingling in certain fingers in the hands.  Past Medical History:  Diagnosis Date  . Benign colon polyp 08/01/2013   [facility] in [place]. large base tranverse colon polyp was biopsied. polyp was benign with minimal surface hyperplastic change.  . Chronic combined systolic and diastolic CHF (congestive heart failure) ([place])   . Chronic pain   . CKD (chronic kidney disease), stage III ([place])   . Diabetes  mellitus without complication ([place])   . Diverticulosis   . Esophageal hiatal hernia 07/29/2013   confirmed on EGD   . Esophageal stricture   . Essential hypertension   . Gastritis 07/29/2013   confirmed on EGD, bx done an negative for intestinal metaplasia, dsyplasia or H. pylori. normal gastric emptying study done 07/13/2013.  [name] GERD (gastroesophageal reflux disease)   . Morbid obesity ([place])   . Non-obstructive CAD    a. 02/2013 Cath ([place]): nonobs dzs;  b. 09/2014 Myoview ([place]): EF 55%, no ischemia;  c. Cath 05/2018 mild CAD no obstruction.  . Persistent atrial fibrillation ([name])    a. 02/2013 s/p rfca in [place], [place]-->prev on Xarelto, d/c'd 2/2 anemia, ? GIB.  [name] Rheumatoid arthritis ([place])   . Sigmoid diverticulosis 08/01/2013   confirmed on colonscopy. record scanned into chart   Allergies  Allergen Reactions  . Nsaids Other (See Comments)    Stomach pains. Ulcers - stated by patient   . Jardiance [Empagliflozin] Nausea And Vomiting    Current Outpatient Medications on File Prior to Visit  Medication Sig Dispense Refill  . Accu-Chek Softclix Lancets lancets USE AS INSTRUCTED 100 each 12  . albuterol (VENTOLIN HFA) 108 (90 Base) MCG/ACT inhaler INHALE 2 PUFFS INTO THE LUNGS EVERY 6 HOURS AS NEEDED FOR WHEEZING OR SHORTNESS OF BREATH. 8.5 g 1  . allopurinol (ZYLOPRIM) 100 MG tablet Take 200 mg by mouth every evening.     [name] aspirin (EQ ASPIRIN ADULT LOW DOSE) 81 MG EC tablet Take 1 tablet (81 mg  total) by mouth daily. TAKE 1 TABLET BY MOUTH ONCE DAILY. SWALLOW WHOLE Please hold asa until you repeat blood work, resume if blood count stable and ok with you primary care doctor    . carvedilol (COREG) 12.5 MG tablet TAKE 1 TABLET BY MOUTH TWO TIMES DAILY WITH MEALS 180 tablet 1  . cetirizine (ZYRTEC) 10 MG tablet TAKE 1 TABLET BY MOUTH ONCE A DAY 30 tablet 11  . Continuous Blood Gluc Receiver (FREESTYLE LIBRE 2 READER) DEVI USE AS DIRECTED 1 each 1  . Continuous Blood Gluc Sensor  (FREESTYLE LIBRE 2 SENSOR) MISC USE AS DIRECTED EVERY 14 DAYS 6 each 3  . COSENTYX SENSOREADY PEN 150 MG/ML SOAJ SMARTSIG:1 Pre-Filled Pen Syringe SUB-Q Every 4 Weeks    . diclofenac Sodium (VOLTAREN) 1 % GEL Apply 4 g topically 4 (four) times daily. (Patient taking differently: Apply 4 g topically 4 (four) times daily as needed (pain).) 100 g 0  . DULoxetine (CYMBALTA) 20 MG capsule TAKE 1 CAPSULE BY MOUTH ONCE A DAY 90 capsule 0  . eszopiclone (LUNESTA) 1 MG TABS tablet TAKE 1 TABLET BY MOUTH EVERY NIGHT IMMEDIATELY BEFORE BEDTIME AS NEEDED FOR SLEEP 30 tablet 1  . famotidine (PEPCID) 20 MG tablet TAKE 1 TABLET BY MOUTH 2 TIMES DAILY 60 tablet 2  . fluticasone (FLONASE) 50 MCG/ACT nasal spray PLACE 1 SPRAY INTO BOTH NOSTRILS DAILY AS NEEDED FOR ALLERGIES OR RHINITIS. 16 g 1  . furosemide (LASIX) 40 MG tablet Take 1 tablet (40 mg total) by mouth every evening. 90 tablet 3  . furosemide (LASIX) 80 MG tablet Take 1 tablet (80 mg total) by mouth every morning. 90 tablet 3  . glucose blood test strip USE TO TEST 4 TIMES DAILY. (Patient taking differently: USE TO TEST 4 TIMES DAILY.) 400 strip 3  . Insulin Lispro Prot & Lispro (HUMALOG MIX 75/25 KWIKPEN) (75-25) 100 UNIT/ML Kwikpen Inject 75 Units into the skin daily with breakfast. 144 mL 3  . mupirocin cream (BACTROBAN) 2 % Apply 1 application topically 2 (two) times daily. 15 g 0  . naloxone (NARCAN) nasal spray 4 mg/0.1 mL Place in nostril in the event of suspected overdose on Narcotic medication (Oxycodone) 1 each 0  . oxyCODONE (OXY IR/ROXICODONE) 5 MG immediate release tablet Take 5 mg by mouth 3 (three) times daily as needed for moderate pain.     . polyethylene glycol (MIRALAX / GLYCOLAX) 17 g packet Take 17 g by mouth daily as needed for moderate constipation or severe constipation. 14 each 0  . potassium chloride SA (KLOR-CON) 20 MEQ tablet Take 1 and 1/2 tablets (30 mEq total) by mouth daily. 90 tablet 3  . Secukinumab 150 MG/ML SOAJ INJECT  ONE PEN SUBCUTANEOUSLY EVERY 4 WEEKS. REFRIGERATE. ALLOW 15 TO 30 MINUTES AT ROOM TEMP PRIOR TO ADMINISTRATION. 3 mL 1  . Tafamidis 61 MG CAPS TAKE 1 CAPSULE (61 MG) BY MOUTH DAILY. 30 capsule 11  . triamcinolone cream (KENALOG) 0.1 % APPLY TO THE AFFECTED AREA(S) TOPICALLY 2 TIMES DAILY AS DIRECTED 30 g 0  . UNIFINE PENTIPS 32G X 4 MM MISC USE AS DIRECTED DAILY 100 each 0  . [DISCONTINUED] hydrALAZINE (APRESOLINE) 25 MG tablet Take 1 tablet (25 mg total) by mouth 3 (three) times daily. 90 tablet 3  . [DISCONTINUED] isosorbide mononitrate (IMDUR) 30 MG 24 hr tablet Take 1 tablet (30 mg total) by mouth daily. 30 tablet 3  . [DISCONTINUED] ramelteon (ROZEREM) 8 MG tablet TAKE 1 TABLET BY MOUTH  AT BEDTIME 30 tablet 1   No current facility-administered medications on file prior to visit.    Observations/Objective: There were no vitals taken for this visit. See HPI  Assessment and Plan: Kiron was seen today for sinusitis.  Diagnoses and all orders for this visit:  Diabetic polyneuropathy associated with type 2 diabetes mellitus ([place]) In  fingertips numbness and tingling and unable to grasp objects take time due to neuropathy.  Patient is followed by and managed by endocrinology for diabetes defer that to Endo  Chronic sinusitis, unspecified location Self examination indicating high likelihood of sinus infection medication sent in to treat if no improvement follow-up with PCP.  Also suggested staying to open up nostrils sinuses , Vicks VapoRub under nose outlining her nose and chest -     dextromethorphan-guaiFENesin (MUCINEX DM) 30-600 MG 12hr tablet; Take 1 tablet by mouth 2 (two) times daily as needed for cough. -     amoxicillin-clavulanate (AUGMENTIN) 875-125 MG tablet; Take 1 tablet by mouth 2 (two) times daily.  Other orders -     oxymetazoline (12 HOUR DECONGESTANT) 0.05 % nasal spray; Place 1 spray into both nostrils 2 (two) times daily. ONLY 3 DAYS patient voiced  understanding-    Follow Up Instructions:    I discussed the assessment and treatment plan with the patient. The patient was provided an opportunity to ask questions and all were answered. The patient agreed with the plan and demonstrated an understanding of the instructions.   The patient was advised to call back or seek an in-person evaluation if the symptoms worsen or if the condition fails to improve as anticipated.     I provided 20 minutes total of -face-to-face time during this encounter including median intraservice time, reviewing previous notes, investigations, ordering medications, medical decision making, coordinating care and patient verbalized understanding at the end of the visit.     Marco Perna, NP.   12/02/2020, 4:24 PM

## 2020-12-04 ENCOUNTER — Encounter: Payer: Self-pay | Admitting: Podiatry

## 2020-12-04 NOTE — Progress Notes (Signed)
  Subjective:  Patient ID: Marco [name], male    DOB: 1955-11-27,  MRN: [id number]  Chief Complaint  Patient presents with  . Diabetes    Foot pain and nail trim   65 y.o. male returns for the above complaint.  Patient presents with thickened elongated dystrophic toenails x10.   Patient states not able to do it himself.  They are painful to touch.  He would like for me to debride them down.  He has secondary complaint of hammertoe of the right fifth digit painful to touch.  He states the shoe rubs against this.  He has not tried any treatment options.  He would like to discuss what his treatment options are available hammertoe right  Objective:  There were no vitals filed for this visit. Podiatric Exam: Vascular: dorsalis pedis and posterior tibial pulses are palpable bilateral. Capillary return is immediate. Temperature gradient is WNL. Skin turgor WNL  Sensorium: Normal Semmes Weinstein monofilament test. Normal tactile sensation bilaterally. Nail Exam: Pt has thick disfigured discolored nails with subungual debris noted bilateral entire nail hallux through fifth toenails.  Pain on palpation to the nails. Ulcer Exam: There is no evidence of ulcer or pre-ulcerative changes or infection. Orthopedic Exam: Muscle tone and strength are WNL. No limitations in general ROM. No crepitus or effusions noted. HAV  B/L.  Hammer toes 2-5  B/L. Skin: No Porokeratosis. No infection or ulcers.  Bilateral lower extremity xerosis    Assessment & Plan:   1. Hammertoe of right foot   2. Type 2 diabetes mellitus with polyneuropathy (HCC)   3. Pain due to onychomycosis of toenails of both feet     Patient was evaluated and treated and all questions answered.  Hammertoe fifth digit adductovarus -I explained to the patient the etiology of hammertoe and various treatment options were discussed.  I discussed with him that toe protector and offloading are the best treatment options for it.  If it continues  to hurt him we can discuss surgical options.  I briefly discussed surgical options he would like to hold off for now.  Moves think about surgery.  Xerosis bilateral lower extremity -Clinically improving   Onychomycosis with pain  -Nails palliatively debrided as below. -Educated on self-care  Procedure: Nail Debridement Rationale: pain  Type of Debridement: manual, sharp debridement. Instrumentation: Nail nipper, rotary burr. Number of Nails: 10  Procedures and Treatment: Consent by patient was obtained for treatment procedures. The patient understood the discussion of treatment and procedures well. All questions were answered thoroughly reviewed. Debridement of mycotic and hypertrophic toenails, 1 through 5 bilateral and clearing of subungual debris. No ulceration, no infection noted.  Return Visit-Office Procedure: Patient instructed to return to the office for a follow up visit 3 months for continued evaluation and treatment.  Boneta Lucks, DPM    No follow-ups on file.

## 2020-12-06 DIAGNOSIS — M542 Cervicalgia: Secondary | ICD-10-CM | POA: Diagnosis not present

## 2020-12-06 DIAGNOSIS — M25511 Pain in right shoulder: Secondary | ICD-10-CM | POA: Diagnosis not present

## 2020-12-06 DIAGNOSIS — M25512 Pain in left shoulder: Secondary | ICD-10-CM | POA: Diagnosis not present

## 2020-12-06 DIAGNOSIS — Z79891 Long term (current) use of opiate analgesic: Secondary | ICD-10-CM | POA: Diagnosis not present

## 2020-12-06 DIAGNOSIS — M5412 Radiculopathy, cervical region: Secondary | ICD-10-CM | POA: Diagnosis not present

## 2020-12-06 DIAGNOSIS — M79661 Pain in right lower leg: Secondary | ICD-10-CM | POA: Diagnosis not present

## 2020-12-06 DIAGNOSIS — E1143 Type 2 diabetes mellitus with diabetic autonomic (poly)neuropathy: Secondary | ICD-10-CM | POA: Diagnosis not present

## 2020-12-06 DIAGNOSIS — M79662 Pain in left lower leg: Secondary | ICD-10-CM | POA: Diagnosis not present

## 2020-12-06 DIAGNOSIS — G894 Chronic pain syndrome: Secondary | ICD-10-CM | POA: Diagnosis not present

## 2020-12-06 DIAGNOSIS — M545 Low back pain, unspecified: Secondary | ICD-10-CM | POA: Diagnosis not present

## 2020-12-06 DIAGNOSIS — G8929 Other chronic pain: Secondary | ICD-10-CM | POA: Diagnosis not present

## 2020-12-11 ENCOUNTER — Telehealth: Payer: Self-pay | Admitting: Endocrinology

## 2020-12-11 ENCOUNTER — Telehealth: Payer: Self-pay | Admitting: Internal Medicine

## 2020-12-11 ENCOUNTER — Other Ambulatory Visit (HOSPITAL_COMMUNITY): Payer: Self-pay

## 2020-12-11 MED FILL — Eszopiclone Tab 1 MG: ORAL | 30 days supply | Qty: 30 | Fill #0 | Status: AC

## 2020-12-11 NOTE — Telephone Encounter (Signed)
Copied from [place] [phone]. Topic: General - Other >> Dec 11, 2020  3:57 PM Celene Kras wrote: Reason for CRM: Pt called and is requesting to have an in office appt. Pt states that he has sinus pressure, headache, and congestion. Pt was offered a virtual visit and declined stating that he is not getting any better from previous virtual visits. Please advise.  Called Pt to try to get him a sooner appt. Pt declined going to the mobile unit and stated he only wanted to see a MD and would not see anyone that was NP or PA. Explain to pt Dr. Wynetta Emery, Margarita Rana and Joya Gaskins were fully booked but he is added to the waitlist. Pt was upset he could not get in sooner and stated he would be looking for a new doctor since he has to wait weeks before getting an appt when sick and he been having these symptoms for a while now.

## 2020-12-11 NOTE — Telephone Encounter (Signed)
Patient called to advise that he received his DEXCOM G6 and is calling to get set up for the training with Estell Harpin   Call # [phone]

## 2020-12-12 NOTE — Telephone Encounter (Signed)
LVM with next available times for Tuesday or Wednesday of next week.

## 2020-12-13 ENCOUNTER — Telehealth: Payer: Self-pay

## 2020-12-13 DIAGNOSIS — J9601 Acute respiratory failure with hypoxia: Secondary | ICD-10-CM | POA: Diagnosis not present

## 2020-12-13 NOTE — Telephone Encounter (Signed)
Returned pt call pt didn't answer lvm  

## 2020-12-13 NOTE — Telephone Encounter (Signed)
Contacted pt to schedule a virtual appt with provider for sinus pressure pt didn't answer lvm

## 2020-12-13 NOTE — Telephone Encounter (Signed)
Copied from [place] [phone] [phone]. Topic: General - Other >> Dec 13, 2020 12:33 PM Yvette Rack wrote: Reason for CRM: Pt requests call back from [place]. Cb# [phone]

## 2020-12-13 NOTE — Telephone Encounter (Signed)
Pt returned call to office, tried calling office no answer. Pt wants to be seen sooner than available with [facility] scheduling.

## 2020-12-17 ENCOUNTER — Other Ambulatory Visit (HOSPITAL_COMMUNITY): Payer: Self-pay

## 2020-12-17 ENCOUNTER — Ambulatory Visit: Payer: Medicare Other | Attending: Internal Medicine | Admitting: Internal Medicine

## 2020-12-17 DIAGNOSIS — J329 Chronic sinusitis, unspecified: Secondary | ICD-10-CM | POA: Diagnosis not present

## 2020-12-17 DIAGNOSIS — L309 Dermatitis, unspecified: Secondary | ICD-10-CM | POA: Diagnosis not present

## 2020-12-17 MED ORDER — KETTLE NETI POT SINUS WASH 2300-700 MG NA KIT
PACK | NASAL | 0 refills | Status: DC
Start: 2020-12-17 — End: 2021-06-11
  Filled 2020-12-17: qty 1, fill #0

## 2020-12-17 MED ORDER — TRIAMCINOLONE ACETONIDE 0.1 % EX CREA
TOPICAL_CREAM | CUTANEOUS | 0 refills | Status: AC
  Filled 2020-12-17: qty 454, 30d supply, fill #0

## 2020-12-17 MED ORDER — KETTLE NETI POT SINUS WASH 2300-700 MG NA KIT
PACK | NASAL | 0 refills | Status: DC
  Filled 2020-12-17: qty 1, fill #0

## 2020-12-17 MED FILL — Secukinumab Subcutaneous Soln Auto-injector 150 MG/ML: SUBCUTANEOUS | 30 days supply | Qty: 1 | Fill #1 | Status: AC

## 2020-12-17 MED FILL — Tafamidis Cap 61 MG: ORAL | 30 days supply | Qty: 30 | Fill #1 | Status: AC

## 2020-12-17 NOTE — Telephone Encounter (Signed)
Pt called the office to see about scheduling an appt. He asks that we please call him after 12 at [phone].

## 2020-12-17 NOTE — Progress Notes (Signed)
Has issue with sinus  Hs breakout on left hand.

## 2020-12-17 NOTE — Progress Notes (Signed)
Virtual Visit via Telephone Note  I connected with Marco [name] on 12/17/2020 at 1:58 p.m by telephone and verified that I am speaking with the correct person using two identifiers  Location: Patient: home Provider: office  Participants: Myself Patient CMA: Marco [name]  [facility] interpreter:   I discussed the limitations, risks, security and privacy concerns of performing an evaluation and management service by telephone and the availability of in person appointments. I also discussed with the patient that there may be a patient responsible charge related to this service. The patient expressed understanding and agreed to proceed.   History of Present Illness: Pt with history ofperinephric abscess (12/2019 s/p drainage - E.coli),HTN,DM2 with neuropathy, and nephropathy, CKD stage III, obesity, nonobstructive CAD/cardiac amyloid, chronic combined CHFEF 55-60%in 12/2019 and 25-30% 05/2020,PAF s/p ablation 2017 (Xarelto d/c due to recurrent anemia),OSA on VPAP,on Cosentyx andfollowed by rheumatology in [facility] for psoriasis/gout/nonspec arthritis, chronic insomnia. On narcotics through [facility] for chronic pain inchest/back/shoulders/neck.  Last eval 10/2020  Pt c/o issue with his sinuses for the past few months. States that the left nostril is constantly stopped up and the right one gets stopped up intermittently at night.  It has been causing frontal headaches and feeling like he has to think even though he currently has no teeth.  Difficult to keep his CPAP on at nights when both nostrils are stopped up.  Feels like the Flonase is not working.  Had telephone visit with one of our nurse practitioners about 3 weeks ago.  Told to use Afrin nasal spray for about 3 days then go back to using the Flonase.  That did not help.  Prescribed Augmentin antibiotics which help with decreasing the frequency and the jaw pain.  Prescribe Zyrtec back in March by our PA.  He never got that  filled.  He does have access to some Allegra at home but has not been using that.  Also complains that the dermatitis on the tips of the thumb, second and third fingers on the left hand is a little better but persists.  Skin peels and burns.  Triamcinolone cream helps a little bit.  These areas burn when water gets on it.   Outpatient Encounter Medications as of 12/17/2020  Medication Sig Note  . Accu-Chek Softclix Lancets lancets USE AS INSTRUCTED   . albuterol (VENTOLIN HFA) 108 (90 Base) MCG/ACT inhaler INHALE 2 PUFFS INTO THE LUNGS EVERY 6 HOURS AS NEEDED FOR WHEEZING OR SHORTNESS OF BREATH.   [name] allopurinol (ZYLOPRIM) 100 MG tablet Take 200 mg by mouth every evening.    [name] allopurinol (ZYLOPRIM) 100 MG tablet Take 2 tablets by mouth daily   . amoxicillin-clavulanate (AUGMENTIN) 875-125 MG tablet Take 1 tablet by mouth 2 (two) times daily.   [name] aspirin (EQ ASPIRIN ADULT LOW DOSE) 81 MG EC tablet Take 1 tablet (81 mg total) by mouth daily. TAKE 1 TABLET BY MOUTH ONCE DAILY. SWALLOW WHOLE Please hold asa until you repeat blood work, resume if blood count stable and ok with you primary care doctor   . atorvastatin (LIPITOR) 40 MG tablet Take 1 tablet (40 mg total) by mouth daily. Please keep upcoming appt with [name] in May 2022 before anymore refills. Thank you Final Attempt   . carvedilol (COREG) 12.5 MG tablet TAKE 1 TABLET BY MOUTH TWO TIMES DAILY WITH MEALS   . cetirizine (ZYRTEC) 10 MG tablet TAKE 1 TABLET BY MOUTH ONCE A DAY   . Continuous Blood Gluc Receiver (  FREESTYLE LIBRE 2 READER) DEVI USE AS DIRECTED   . Continuous Blood Gluc Sensor (FREESTYLE LIBRE 2 SENSOR) MISC USE AS DIRECTED EVERY 14 DAYS   . COSENTYX SENSOREADY PEN 150 MG/ML SOAJ SMARTSIG:1 Pre-Filled Pen Syringe SUB-Q Every 4 Weeks   . dextromethorphan-guaiFENesin (MUCINEX DM) 30-600 MG 12hr tablet Take 1 tablet by mouth 2 (two) times daily as needed for cough.   . diclofenac Sodium (VOLTAREN) 1 % GEL Apply 4 g topically 4  (four) times daily. (Patient taking differently: Apply 4 g topically 4 (four) times daily as needed (pain).)   . DULoxetine (CYMBALTA) 20 MG capsule TAKE 1 CAPSULE BY MOUTH ONCE A DAY   . eszopiclone (LUNESTA) 1 MG TABS tablet TAKE 1 TABLET BY MOUTH EVERY NIGHT IMMEDIATELY BEFORE BEDTIME AS NEEDED FOR SLEEP   . famotidine (PEPCID) 20 MG tablet TAKE 1 TABLET BY MOUTH 2 TIMES DAILY   . fluticasone (FLONASE) 50 MCG/ACT nasal spray PLACE 1 SPRAY INTO BOTH NOSTRILS DAILY AS NEEDED FOR ALLERGIES OR RHINITIS.   . furosemide (LASIX) 40 MG tablet Take 1 tablet (40 mg total) by mouth every evening.   . furosemide (LASIX) 80 MG tablet Take 1 tablet (80 mg total) by mouth every morning.   [name] glucose blood test strip USE TO TEST 4 TIMES DAILY. (Patient taking differently: USE TO TEST 4 TIMES DAILY.)   . Insulin Lispro Prot & Lispro (HUMALOG MIX 75/25 KWIKPEN) (75-25) 100 UNIT/ML Kwikpen Inject 75 Units into the skin daily with breakfast.   . mupirocin cream (BACTROBAN) 2 % Apply 1 application topically 2 (two) times daily.   . naloxone (NARCAN) nasal spray 4 mg/0.1 mL Place in nostril in the event of suspected overdose on Narcotic medication (Oxycodone) 08/21/2020: .  . oxyCODONE (OXY IR/ROXICODONE) 5 MG immediate release tablet Take 5 mg by mouth 3 (three) times daily as needed for moderate pain.    [name] oxymetazoline (12 HOUR DECONGESTANT) 0.05 % nasal spray Place 1 spray into both nostrils 2 (two) times daily.   . polyethylene glycol (MIRALAX / GLYCOLAX) 17 g packet Take 17 g by mouth daily as needed for moderate constipation or severe constipation. 08/21/2020: .  . potassium chloride SA (KLOR-CON) 20 MEQ tablet Take 1 and 1/2 tablets (30 mEq total) by mouth daily.   . Secukinumab 150 MG/ML SOAJ INJECT ONE PEN SUBCUTANEOUSLY EVERY 4 WEEKS. REFRIGERATE. ALLOW 15 TO 30 MINUTES AT ROOM TEMP PRIOR TO ADMINISTRATION.   [name] Tafamidis 61 MG CAPS TAKE 1 CAPSULE (61 MG) BY MOUTH DAILY.   [name] triamcinolone cream (KENALOG) 0.1 %  APPLY TO THE AFFECTED AREA(S) TOPICALLY 2 TIMES DAILY AS DIRECTED   . UNIFINE PENTIPS 32G X 4 MM MISC USE AS DIRECTED DAILY   . [DISCONTINUED] hydrALAZINE (APRESOLINE) 25 MG tablet Take 1 tablet (25 mg total) by mouth 3 (three) times daily.   . [DISCONTINUED] isosorbide mononitrate (IMDUR) 30 MG 24 hr tablet Take 1 tablet (30 mg total) by mouth daily.   . [DISCONTINUED] ramelteon (ROZEREM) 8 MG tablet TAKE 1 TABLET BY MOUTH AT BEDTIME    No facility-administered encounter medications on file as of 12/17/2020.      Observations/Objective: These were photos taken in March 2022 by our PA [facility] of his fingers      Assessment and Plan: 1. Chronic congestion of paranasal sinus Recommend using regular Allegra once to twice a day.  Patient has some at home.  Continue the Flonase nasal spray.  I will prescribe a [name] pot.  Advised flushing the nostrils about twice a week I will refer him to ENT for further evaluation and management. - Ambulatory referral to ENT - Sodium Chloride-Sodium Bicarb (KETTLE NETI POT SINUS [place]) 2300-700 MG KIT; Flush the sinuses twice a week.  Asked pharmacist to show you how to use.  Dispense: 1 kit; Refill: 0  2. Hand dermatitis Refer to dermatology for further evaluation.  In the middle of the day I recommend applying some Vaseline cream to the tip of the involved fingers.  Also can try wearing gloves at night after applying triamcinolone cream. - Ambulatory referral to Dermatology - triamcinolone cream (KENALOG) 0.1 %; APPLY TO THE AFFECTED AREA(S) TOPICALLY 2 TIMES DAILY AS DIRECTED  Dispense: 161 each; Refill: 0   Follow Up Instructions: Patient has in person appointment with me next month.   I discussed the assessment and treatment plan with the patient. The patient was provided an opportunity to ask questions and all were answered. The patient agreed with the plan and demonstrated an understanding of the instructions.   The patient was advised to call  back or seek an in-person evaluation if the symptoms worsen or if the condition fails to improve as anticipated.  I  Spent 19 minutes on this telephone encounter  Karle Plumber, MD

## 2020-12-18 NOTE — Telephone Encounter (Signed)
Appt. Scheduled for tomorrow for dexcom start

## 2020-12-19 ENCOUNTER — Encounter: Payer: Medicare Other | Attending: Endocrinology | Admitting: Nutrition

## 2020-12-19 ENCOUNTER — Other Ambulatory Visit: Payer: Self-pay

## 2020-12-19 ENCOUNTER — Other Ambulatory Visit (HOSPITAL_COMMUNITY): Payer: Self-pay

## 2020-12-19 DIAGNOSIS — N182 Chronic kidney disease, stage 2 (mild): Secondary | ICD-10-CM | POA: Insufficient documentation

## 2020-12-19 DIAGNOSIS — E1122 Type 2 diabetes mellitus with diabetic chronic kidney disease: Secondary | ICD-10-CM | POA: Diagnosis not present

## 2020-12-19 NOTE — Progress Notes (Signed)
Patient is here with wife, and they were trained on how to use the Dexcom CGM.  We discussed the difference between sensor glucose and blood glucose, and they reported good understanding of this. The sensor was linked to his phone and to [place].  They had no final questions.

## 2020-12-19 NOTE — Patient Instructions (Signed)
Replace sensor every 10 days Replace transmitter every 3 months Call Dexcom help line if questions.

## 2020-12-24 DIAGNOSIS — G4700 Insomnia, unspecified: Secondary | ICD-10-CM | POA: Diagnosis not present

## 2020-12-24 DIAGNOSIS — G4733 Obstructive sleep apnea (adult) (pediatric): Secondary | ICD-10-CM | POA: Diagnosis not present

## 2020-12-28 ENCOUNTER — Other Ambulatory Visit: Payer: Self-pay

## 2020-12-28 ENCOUNTER — Other Ambulatory Visit (HOSPITAL_COMMUNITY): Payer: Self-pay

## 2020-12-28 ENCOUNTER — Ambulatory Visit (INDEPENDENT_AMBULATORY_CARE_PROVIDER_SITE_OTHER): Payer: Medicare Other | Admitting: Endocrinology

## 2020-12-28 VITALS — BP 142/74 | HR 71 | Ht 66.0 in | Wt 217.4 lb

## 2020-12-28 DIAGNOSIS — N183 Chronic kidney disease, stage 3 unspecified: Secondary | ICD-10-CM | POA: Diagnosis not present

## 2020-12-28 DIAGNOSIS — Z794 Long term (current) use of insulin: Secondary | ICD-10-CM | POA: Diagnosis not present

## 2020-12-28 DIAGNOSIS — E1122 Type 2 diabetes mellitus with diabetic chronic kidney disease: Secondary | ICD-10-CM | POA: Diagnosis not present

## 2020-12-28 DIAGNOSIS — E1159 Type 2 diabetes mellitus with other circulatory complications: Secondary | ICD-10-CM

## 2020-12-28 LAB — POCT GLYCOSYLATED HEMOGLOBIN (HGB A1C): Hemoglobin A1C: 7.9 % — AB (ref 4.0–5.6)

## 2020-12-28 MED ORDER — HUMALOG MIX 50/50 KWIKPEN (50-50) 100 UNIT/ML ~~LOC~~ SUPN
75.0000 [IU] | PEN_INJECTOR | Freq: Every day | SUBCUTANEOUS | 3 refills | Status: DC
  Filled 2020-12-28: qty 66, 88d supply, fill #0
  Filled 2021-03-12: qty 66, 88d supply, fill #1

## 2020-12-28 NOTE — Patient Instructions (Addendum)
check your blood sugar twice a day.  vary the time of day when you check, between before the 3 meals, and at bedtime.  also check if you have symptoms of your blood sugar being too high or too low.  please keep a record of the readings and bring it to your next appointment here (or you can bring the meter itself).  You can write it on any piece of paper.  please call us sooner if your blood sugar goes below 70, or if you have a lot of readings over 200.   Please change the insulin to "50/50," 75 units with breakfast.   On this type of insulin schedule, you should eat meals on a regular schedule (especially lunch).  If a meal is missed or significantly delayed, your blood sugar could go low.   Please come back for a follow-up appointment in 2 months.

## 2020-12-28 NOTE — Progress Notes (Signed)
Subjective:    Patient ID: Marco [name], male    DOB: 28-Dec-1955, 65 y.o.   MRN: [id number]  HPI Pt returns for f/u of diabetes mellitus: DM type: Insulin-requiring type 2 Dx'ed: 2585 Complications: PN, CAD, NPDR, and stage 3a CRI.  Therapy: insulin since soon after dx DKA: never Severe hypoglycemia: once (2020) Pancreatitis: never.  Pancreatic imaging: never.  Other: he intermittently takes prednisone, for psoriasis; he declined to continue multiple daily injections; due to renal failure (and fasting hypoglycemia), he takes 75/25 insulin; he declines GLP med. Interval history: no recent steroids.  I reviewed continuous glucose monitor data.  Glucose varies from 145-400.  It is in general lowest 8AM-11AM. It increases 11AM-4PM, and decreases 3AM-8AM. pt says he never misses the insulin.   Past Medical History:  Diagnosis Date  . Benign colon polyp 08/01/2013   [facility] in [place]. large base tranverse colon polyp was biopsied. polyp was benign with minimal surface hyperplastic change.  . Chronic combined systolic and diastolic CHF (congestive heart failure) ([place])   . Chronic pain   . CKD (chronic kidney disease), stage III ([place])   . Diabetes mellitus without complication ([place])   . Diverticulosis   . Esophageal hiatal hernia 07/29/2013   confirmed on EGD   . Esophageal stricture   . Essential hypertension   . Gastritis 07/29/2013   confirmed on EGD, bx done an negative for intestinal metaplasia, dsyplasia or H. pylori. normal gastric emptying study done 07/13/2013.  [name] GERD (gastroesophageal reflux disease)   . Morbid obesity ([place])   . Non-obstructive CAD    a. 02/2013 Cath ([place]): nonobs dzs;  b. 09/2014 Myoview ([place]): EF 55%, no ischemia;  c. Cath 05/2018 mild CAD no obstruction.  . Persistent atrial fibrillation ([place])    a. 02/2013 s/p rfca in [place], [place]-->prev on Xarelto, d/c'd 2/2 anemia, ? GIB.  [name] Rheumatoid arthritis ([place])   . Sigmoid diverticulosis  08/01/2013   confirmed on colonscopy. record scanned into chart    Past Surgical History:  Procedure Laterality Date  . CARDIAC CATHETERIZATION  05/2014   ablation for atrial fibrillation  . CARDIAC CATHETERIZATION N/A 11/16/2015   Procedure: Left Heart Cath and Coronary Angiography;  Surgeon: Leonie Man, MD;  Location: [facility];  Service: Cardiovascular;  Laterality: N/A;  . COLON RESECTION  09/2013   due to large, abnormal polpy. non cancerous per patient.   . COLON SURGERY  09/2014   colon resection   . IR PERC PLEURAL DRAIN W/INDWELL CATH W/IMG GUIDE  01/07/2020  . LEFT HEART CATH AND CORONARY ANGIOGRAPHY N/A 05/19/2018   Procedure: LEFT HEART CATH AND CORONARY ANGIOGRAPHY;  Surgeon: Wellington Hampshire, MD;  Location: [facility];  Service: Cardiovascular;  Laterality: N/A;    Social History   Socioeconomic History  . Marital status: Married    Spouse name: Amethyst  . Number of children: 8  . Years of education: 21  . Highest education level: Some college, no degree  Occupational History    Comment: disabled  Tobacco Use  . Smoking status: Former Smoker    Packs/day: 0.50    Years: 10.00    Pack years: 5.00    Types: Cigarettes    Quit date: 04/11/2008    Years since quitting: 12.7  . Smokeless tobacco: Never Used  Vaping Use  . Vaping Use: Never used  Substance and Sexual Activity  . Alcohol use: No    Alcohol/week: 0.0  standard drinks  . Drug use: No  . Sexual activity: Yes    Partners: Female    Birth control/protection: None  Other Topics Concern  . Not on file  Social History Narrative   Lives with wife. Does not work.  On disability.    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 Provided crisis assistance program information to assist with bills       Has issues  getting food- gets $36/month in food stamps- already has list of food pantries but has not tried them- encouraged pt to try food pantries and reach out to clinic for help if needed   Social Determinants of Health   Financial Resource Strain: Not on file  Food Insecurity: Not on file  Transportation Needs: Not on file  Physical Activity: Not on file  Stress: Not on file  Social Connections: Not on file  Intimate Partner Violence: Not on file    Current Outpatient Medications on File Prior to Visit  Medication Sig Dispense Refill  . Accu-Chek Softclix Lancets lancets USE AS INSTRUCTED 100 each 12  . albuterol (VENTOLIN HFA) 108 (90 Base) MCG/ACT inhaler INHALE 2 PUFFS INTO THE LUNGS EVERY 6 HOURS AS NEEDED FOR WHEEZING OR SHORTNESS OF BREATH. 8.5 g 1  . allopurinol (ZYLOPRIM) 100 MG tablet Take 200 mg by mouth every evening.     [name] allopurinol (ZYLOPRIM) 100 MG tablet Take 2 tablets by mouth daily 60 tablet 5  . aspirin (EQ ASPIRIN ADULT LOW DOSE) 81 MG EC tablet Take 1 tablet (81 mg total) by mouth daily. TAKE 1 TABLET BY MOUTH ONCE DAILY. SWALLOW WHOLE Please hold asa until you repeat blood work, resume if blood count stable and ok with you primary care doctor    . atorvastatin (LIPITOR) 40 MG tablet Take 1 tablet (40 mg total) by mouth daily. Please keep upcoming appt with [name] in May 2022 before anymore refills. Thank you Final Attempt 45 tablet 0  . carvedilol (COREG) 12.5 MG tablet TAKE 1 TABLET BY MOUTH TWO TIMES DAILY WITH MEALS 180 tablet 1  . Continuous Blood Gluc Receiver (FREESTYLE LIBRE 2 READER) DEVI USE AS DIRECTED 1 each 1  . Continuous Blood Gluc Sensor (FREESTYLE LIBRE 2 SENSOR) MISC USE AS DIRECTED EVERY 14 DAYS 6 each 3  . COSENTYX SENSOREADY PEN 150 MG/ML SOAJ SMARTSIG:1 Pre-Filled Pen Syringe SUB-Q Every 4 Weeks    . dextromethorphan-guaiFENesin (MUCINEX DM) 30-600 MG 12hr tablet Take 1 tablet by mouth 2 (two) times daily as needed for cough. 30 tablet 1  .  diclofenac Sodium (VOLTAREN) 1 % GEL Apply 4 g topically 4 (four) times daily. (Patient taking differently: Apply 4 g topically 4 (four) times daily as needed (pain).) 100 g 0  . DULoxetine (CYMBALTA) 20 MG capsule TAKE 1 CAPSULE BY MOUTH ONCE A DAY 90 capsule 0  . eszopiclone (LUNESTA) 1 MG TABS tablet TAKE 1 TABLET BY MOUTH EVERY NIGHT IMMEDIATELY BEFORE BEDTIME AS NEEDED FOR SLEEP 30 tablet 1  . famotidine (PEPCID) 20 MG tablet TAKE 1 TABLET BY MOUTH 2 TIMES DAILY 60 tablet 2  . fluticasone (FLONASE) 50 MCG/ACT nasal spray PLACE 1 SPRAY INTO BOTH NOSTRILS DAILY AS NEEDED FOR ALLERGIES OR RHINITIS. 16 g 1  . furosemide (LASIX) 40 MG tablet Take  1 tablet (40 mg total) by mouth every evening. 90 tablet 3  . furosemide (LASIX) 80 MG tablet Take 1 tablet (80 mg total) by mouth every morning. 90 tablet 3  . glucose blood test strip USE TO TEST 4 TIMES DAILY. (Patient taking differently: USE TO TEST 4 TIMES DAILY.) 400 strip 3  . mupirocin cream (BACTROBAN) 2 % Apply 1 application topically 2 (two) times daily. 15 g 0  . naloxone (NARCAN) nasal spray 4 mg/0.1 mL Place in nostril in the event of suspected overdose on Narcotic medication (Oxycodone) 1 each 0  . oxyCODONE (OXY IR/ROXICODONE) 5 MG immediate release tablet Take 5 mg by mouth 3 (three) times daily as needed for moderate pain.     [name] oxymetazoline (12 HOUR DECONGESTANT) 0.05 % nasal spray Place 1 spray into both nostrils 2 (two) times daily. 30 mL 0  . polyethylene glycol (MIRALAX / GLYCOLAX) 17 g packet Take 17 g by mouth daily as needed for moderate constipation or severe constipation. 14 each 0  . potassium chloride SA (KLOR-CON) 20 MEQ tablet Take 1 and 1/2 tablets (30 mEq total) by mouth daily. 90 tablet 3  . Secukinumab 150 MG/ML SOAJ INJECT ONE PEN SUBCUTANEOUSLY EVERY 4 WEEKS. REFRIGERATE. ALLOW 15 TO 30 MINUTES AT ROOM TEMP PRIOR TO ADMINISTRATION. 3 mL 1  . Sodium Chloride-Sodium Bicarb (KETTLE NETI POT SINUS WASH) 2300-700 MG KIT Flush  the sinuses twice a week.  Asked pharmacist to show you how to use. 1 kit 0  . Tafamidis 61 MG CAPS TAKE 1 CAPSULE (61 MG) BY MOUTH DAILY. 30 capsule 11  . triamcinolone cream (KENALOG) 0.1 % APPLY TO THE AFFECTED AREA(S) TOPICALLY 2 TIMES DAILY AS DIRECTED 454 g 0  . UNIFINE PENTIPS 32G X 4 MM MISC USE AS DIRECTED DAILY 100 each 0  . [DISCONTINUED] hydrALAZINE (APRESOLINE) 25 MG tablet Take 1 tablet (25 mg total) by mouth 3 (three) times daily. 90 tablet 3  . [DISCONTINUED] isosorbide mononitrate (IMDUR) 30 MG 24 hr tablet Take 1 tablet (30 mg total) by mouth daily. 30 tablet 3  . [DISCONTINUED] ramelteon (ROZEREM) 8 MG tablet TAKE 1 TABLET BY MOUTH AT BEDTIME 30 tablet 1   No current facility-administered medications on file prior to visit.    Allergies  Allergen Reactions  . Nsaids Other (See Comments)    Stomach pains. Ulcers - stated by patient   . Jardiance [Empagliflozin] Nausea And Vomiting    Family History  Problem Relation Age of Onset  . Hypertension Mother   . Diabetes Mother   . COPD Mother   . Lung cancer Mother        Smoker   . Hypertension Father   . Diabetes Father   . Heart Problems Father   . COPD Father   . Colon cancer Neg Hx   . Stomach cancer Neg Hx     BP (!) 142/74 (BP Location: Right Arm, Patient Position: Sitting, Cuff Size: Large)   Pulse 71   Ht _0  (1.676 m)   Wt 217 lb 6.4 oz (98.6 kg)   SpO2 97%   BMI 35.09 kg/m    Review of Systems     Objective:   Physical Exam VITAL SIGNS:  See vs page GENERAL: no distress Pulses: dorsalis pedis intact bilat.   MSK: no deformity of the feet CV: no leg edema Skin:  no ulcer on the feet.  normal color and temp on the feet. Neuro: sensation is intact to  touch on the feet, but decreased from normal.     Lab Results  Component Value Date   HGBA1C 7.9 (A) 12/28/2020       Assessment & Plan:  Insulin-requiring type 2 DM: uncontrolled.   The pattern of his cbg's indicates he needs a  faster-acting qam insulin   Patient Instructions  check your blood sugar twice a day.  vary the time of day when you check, between before the 3 meals, and at bedtime.  also check if you have symptoms of your blood sugar being too high or too low.  please keep a record of the readings and bring it to your next appointment here (or you can bring the meter itself).  You can write it on any piece of paper.  please call us sooner if your blood sugar goes below 70, or if you have a lot of readings over 200.   Please change the insulin to "50/50," 75 units with breakfast.   On this type of insulin schedule, you should eat meals on a regular schedule (especially lunch).  If a meal is missed or significantly delayed, your blood sugar could go low.   Please come back for a follow-up appointment in 2 months.

## 2020-12-31 ENCOUNTER — Other Ambulatory Visit (HOSPITAL_COMMUNITY): Payer: Self-pay

## 2020-12-31 ENCOUNTER — Other Ambulatory Visit (HOSPITAL_COMMUNITY)
Admission: RE | Admit: 2020-12-31 | Discharge: 2020-12-31 | Disposition: A | Discharge status: Home / Self Care | Payer: Medicare Other | Source: Ambulatory Visit | Attending: Urology | Admitting: Urology

## 2020-12-31 DIAGNOSIS — Z01812 Encounter for preprocedural laboratory examination: Secondary | ICD-10-CM | POA: Insufficient documentation

## 2020-12-31 DIAGNOSIS — Z20822 Contact with and (suspected) exposure to covid-19: Secondary | ICD-10-CM | POA: Insufficient documentation

## 2020-12-31 DIAGNOSIS — N201 Calculus of ureter: Secondary | ICD-10-CM | POA: Diagnosis not present

## 2020-12-31 LAB — SARS CORONAVIRUS 2 (TAT 6-24 HRS): SARS Coronavirus 2: NEGATIVE

## 2021-01-01 ENCOUNTER — Other Ambulatory Visit: Payer: Self-pay | Admitting: Internal Medicine

## 2021-01-01 ENCOUNTER — Other Ambulatory Visit: Payer: Self-pay | Admitting: Cardiology

## 2021-01-01 ENCOUNTER — Other Ambulatory Visit (HOSPITAL_COMMUNITY): Payer: Self-pay

## 2021-01-01 ENCOUNTER — Other Ambulatory Visit (HOSPITAL_COMMUNITY): Payer: Self-pay | Admitting: Internal Medicine

## 2021-01-01 DIAGNOSIS — F5104 Psychophysiologic insomnia: Secondary | ICD-10-CM

## 2021-01-01 MED ORDER — DULOXETINE HCL 20 MG PO CPEP
ORAL_CAPSULE | Freq: Every day | ORAL | 0 refills | Status: DC
  Filled 2021-01-01: qty 90, 90d supply, fill #0

## 2021-01-01 MED FILL — Famotidine Tab 20 MG: ORAL | 30 days supply | Qty: 60 | Fill #1 | Status: AC

## 2021-01-01 NOTE — Progress Notes (Signed)
Left message for patient to return call for ESWL instructions. 

## 2021-01-01 NOTE — Telephone Encounter (Signed)
Requested Prescriptions  Pending Prescriptions Disp Refills  . DULoxetine (CYMBALTA) 20 MG capsule 90 capsule 0    Sig: TAKE 1 CAPSULE BY MOUTH ONCE A DAY     There is no refill protocol information for this order

## 2021-01-01 NOTE — Telephone Encounter (Signed)
Requested medication (s) are due for refill today:Due 01/05/21  Requested medication (s) are on the active medication list: yes  Last refill:11/05/20 #30  1 refill  Future visit scheduled  yes 01/24/21  Notes to clinic: not delegated  Requested Prescriptions  Pending Prescriptions Disp Refills   eszopiclone (LUNESTA) 1 MG TABS tablet 30 tablet 1    Sig: TAKE 1 TABLET BY MOUTH EVERY NIGHT IMMEDIATELY BEFORE BEDTIME AS NEEDED FOR SLEEP      Not Delegated - Psychiatry:  Anxiolytics/Hypnotics Failed - 01/01/2021  2:29 PM      Failed - This refill cannot be delegated      Failed - Urine Drug Screen completed in last 360 days      Passed - Valid encounter within last 6 months    Recent Outpatient Visits           2 weeks ago Chronic congestion of paranasal sinus   [place], Neoma Laming B, MD   1 month ago Diabetic polyneuropathy associated with type 2 diabetes mellitus ([place])   [facility] Kerin Perna, NP   1 month ago Preoperative evaluation to rule out surgical contraindication   [place], Deborah B, MD   5 months ago Type 2 diabetes mellitus with stage 3a chronic kidney disease, with long-term current use of insulin ([place])   [place], Deborah B, MD   6 months ago Hospital discharge follow-up   [place], Deborah B, MD       Future Appointments             In 1 week [name], Eber Hong, MD [place]   In 3 weeks Lucia Gaskins, Leonides Sake, MD [facility]   In 3 weeks [name], MD [place]

## 2021-01-02 ENCOUNTER — Other Ambulatory Visit (HOSPITAL_COMMUNITY): Payer: Self-pay

## 2021-01-02 ENCOUNTER — Telehealth: Payer: Self-pay | Admitting: Nutrition

## 2021-01-02 MED ORDER — ESZOPICLONE 1 MG PO TABS
ORAL_TABLET | ORAL | 1 refills | Status: DC
Start: 2021-01-02 — End: 2021-06-11
  Filled 2021-01-02 – 2021-01-08 (×2): qty 30, 30d supply, fill #0
  Filled 2021-02-04 – 2021-03-06 (×3): qty 30, 30d supply, fill #1

## 2021-01-02 MED ORDER — CARVEDILOL 12.5 MG PO TABS
ORAL_TABLET | Freq: Two times a day (BID) | ORAL | 1 refills | Status: DC
  Filled 2021-01-02: qty 180, 90d supply, fill #0
  Filled 2021-04-05: qty 180, 90d supply, fill #1

## 2021-01-02 NOTE — Progress Notes (Signed)
Pt called back took 81 mg ASA on 05/04/21 at 1900.Voltran gel was last used on Monday 12/31/20. Instructions given arrival time at 0915/ cl liquids to 0715. Litho truck called to see if pt still can be done due to ASA. Will call patient back with an okay or a no. Wife is the driver

## 2021-01-02 NOTE — Telephone Encounter (Signed)
Patient reports that he likes the [facility] and would like a prescription sent to [facility] patient [facility].  1 Dexcom q 10 days and 1 transmitter q 3 months.

## 2021-01-02 NOTE — Telephone Encounter (Signed)
lipids managed by [facility]

## 2021-01-02 NOTE — Progress Notes (Signed)
Left message on home phone and cell phone to return our call for instructions

## 2021-01-02 NOTE — Progress Notes (Signed)
[facility] returned call as long as stone does not move upward they will be able to do the case. Pt called and informed that it is a go

## 2021-01-03 ENCOUNTER — Ambulatory Visit: Payer: Medicare Other | Admitting: Endocrinology

## 2021-01-03 ENCOUNTER — Ambulatory Visit (HOSPITAL_BASED_OUTPATIENT_CLINIC_OR_DEPARTMENT_OTHER)
Admission: RE | Admit: 2021-01-03 | Discharge: 2021-01-03 | Disposition: A | Discharge status: Home / Self Care | Payer: Medicare Other | Attending: Urology | Admitting: Urology

## 2021-01-03 ENCOUNTER — Encounter (HOSPITAL_BASED_OUTPATIENT_CLINIC_OR_DEPARTMENT_OTHER)
Admission: RE | Disposition: A | Discharge status: Home / Self Care | Payer: Self-pay | Source: Home / Self Care | Attending: Urology

## 2021-01-03 ENCOUNTER — Ambulatory Visit (HOSPITAL_COMMUNITY): Payer: Medicare Other

## 2021-01-03 ENCOUNTER — Other Ambulatory Visit (HOSPITAL_COMMUNITY): Payer: Self-pay

## 2021-01-03 DIAGNOSIS — Z886 Allergy status to analgesic agent status: Secondary | ICD-10-CM | POA: Diagnosis not present

## 2021-01-03 DIAGNOSIS — Z888 Allergy status to other drugs, medicaments and biological substances status: Secondary | ICD-10-CM | POA: Insufficient documentation

## 2021-01-03 DIAGNOSIS — N2889 Other specified disorders of kidney and ureter: Secondary | ICD-10-CM | POA: Diagnosis not present

## 2021-01-03 DIAGNOSIS — N201 Calculus of ureter: Secondary | ICD-10-CM | POA: Insufficient documentation

## 2021-01-03 DIAGNOSIS — Z01818 Encounter for other preprocedural examination: Secondary | ICD-10-CM | POA: Diagnosis not present

## 2021-01-03 DIAGNOSIS — Z87442 Personal history of urinary calculi: Secondary | ICD-10-CM | POA: Diagnosis not present

## 2021-01-03 HISTORY — PX: EXTRACORPOREAL SHOCK WAVE LITHOTRIPSY: SHX1557

## 2021-01-03 LAB — GLUCOSE, CAPILLARY: Glucose-Capillary: 134 mg/dL — ABNORMAL HIGH (ref 70–99)

## 2021-01-03 IMAGING — DX DG ABDOMEN 1V
1 series · 1 of 1 positions shown · non-contrast
Comparison: December 10, 2019

CLINICAL DATA: Orogastric tube placement.

EXAM:
ABDOMEN - 1 VIEW

[abdomen kub]
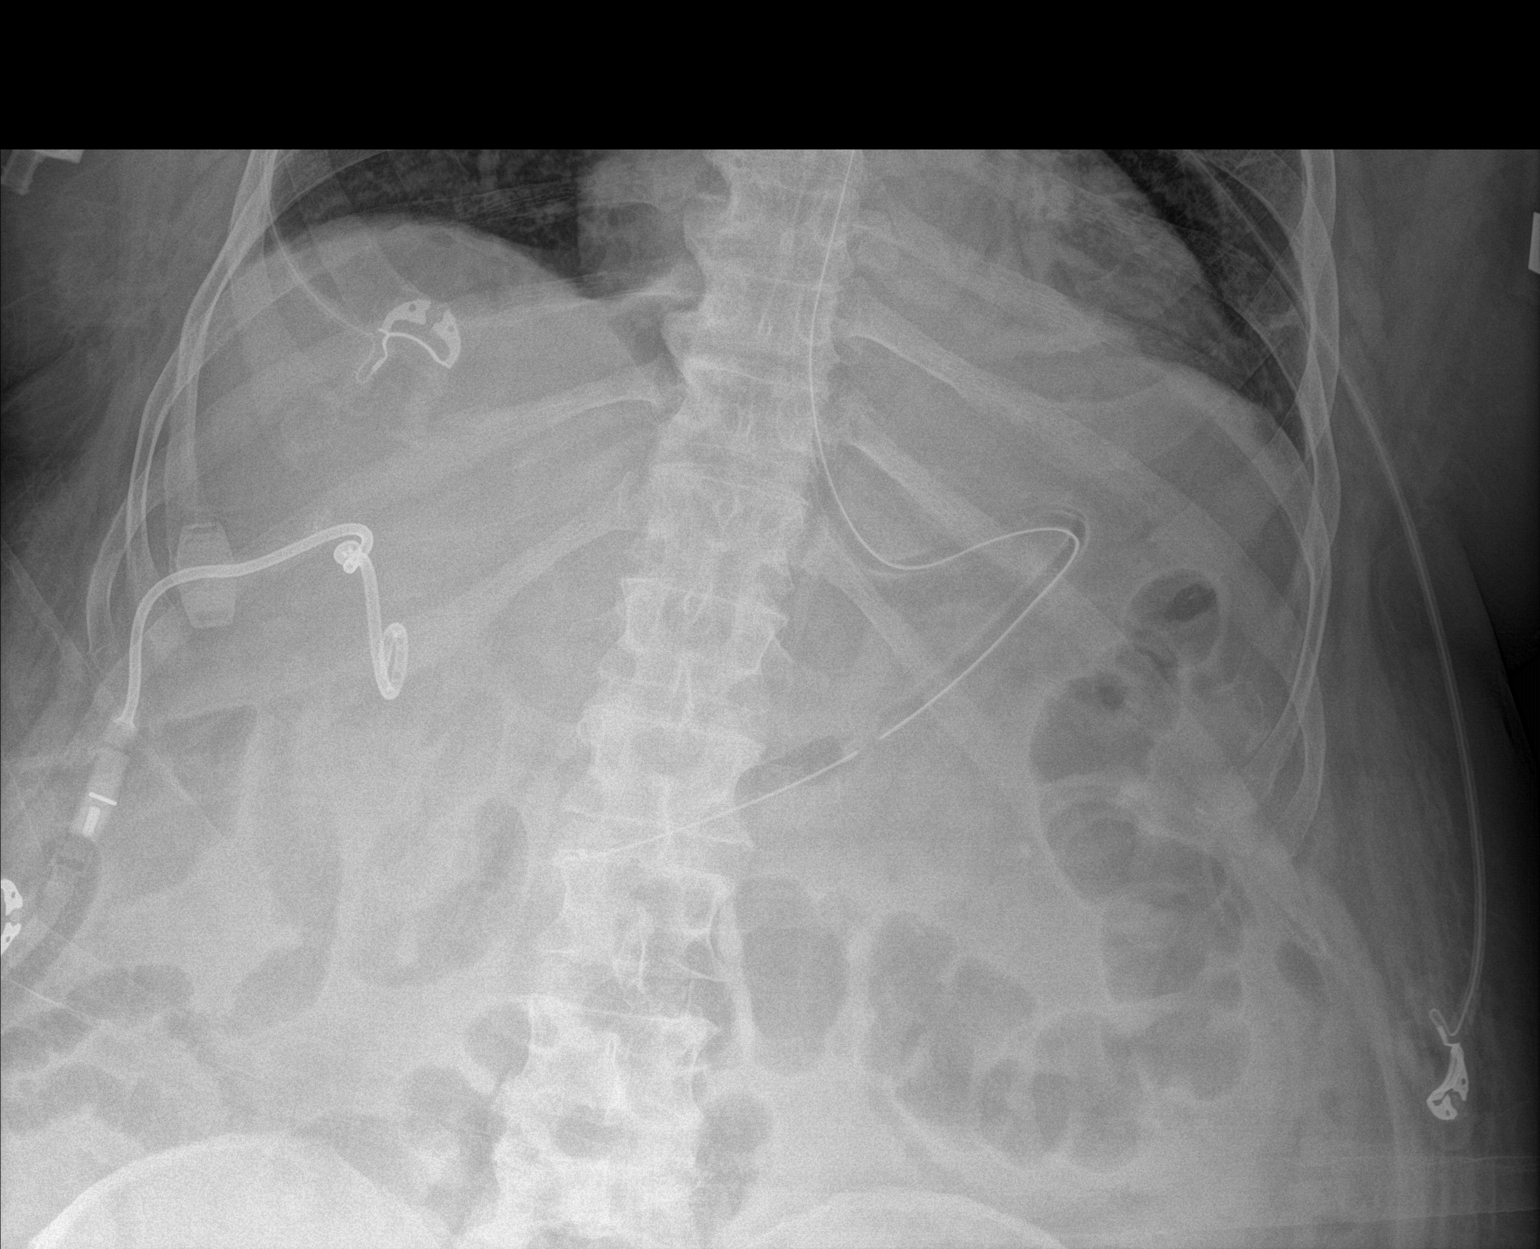

[1 of 1 positions shown; findings below may reference images not displayed]

FINDINGS: Enteric catheter follows the expected location of the gastric body
terminating in the area of the antrum.

Nonobstructive bowel gas pattern.

Pigtail catheter overlies the right upper quadrant of the abdomen.

Left renal calculus is again noted.
IMPRESSION: Enteric catheter follows the expected location of the gastric body
terminating in the area of the antrum.

## 2021-01-03 IMAGING — DX DG CHEST 1V PORT
1 series · 1 of 1 positions shown · non-contrast
Comparison: December 31, 2019

CLINICAL DATA: Acute respiratory failure.

EXAM:
PORTABLE CHEST 1 VIEW

[chest ap]
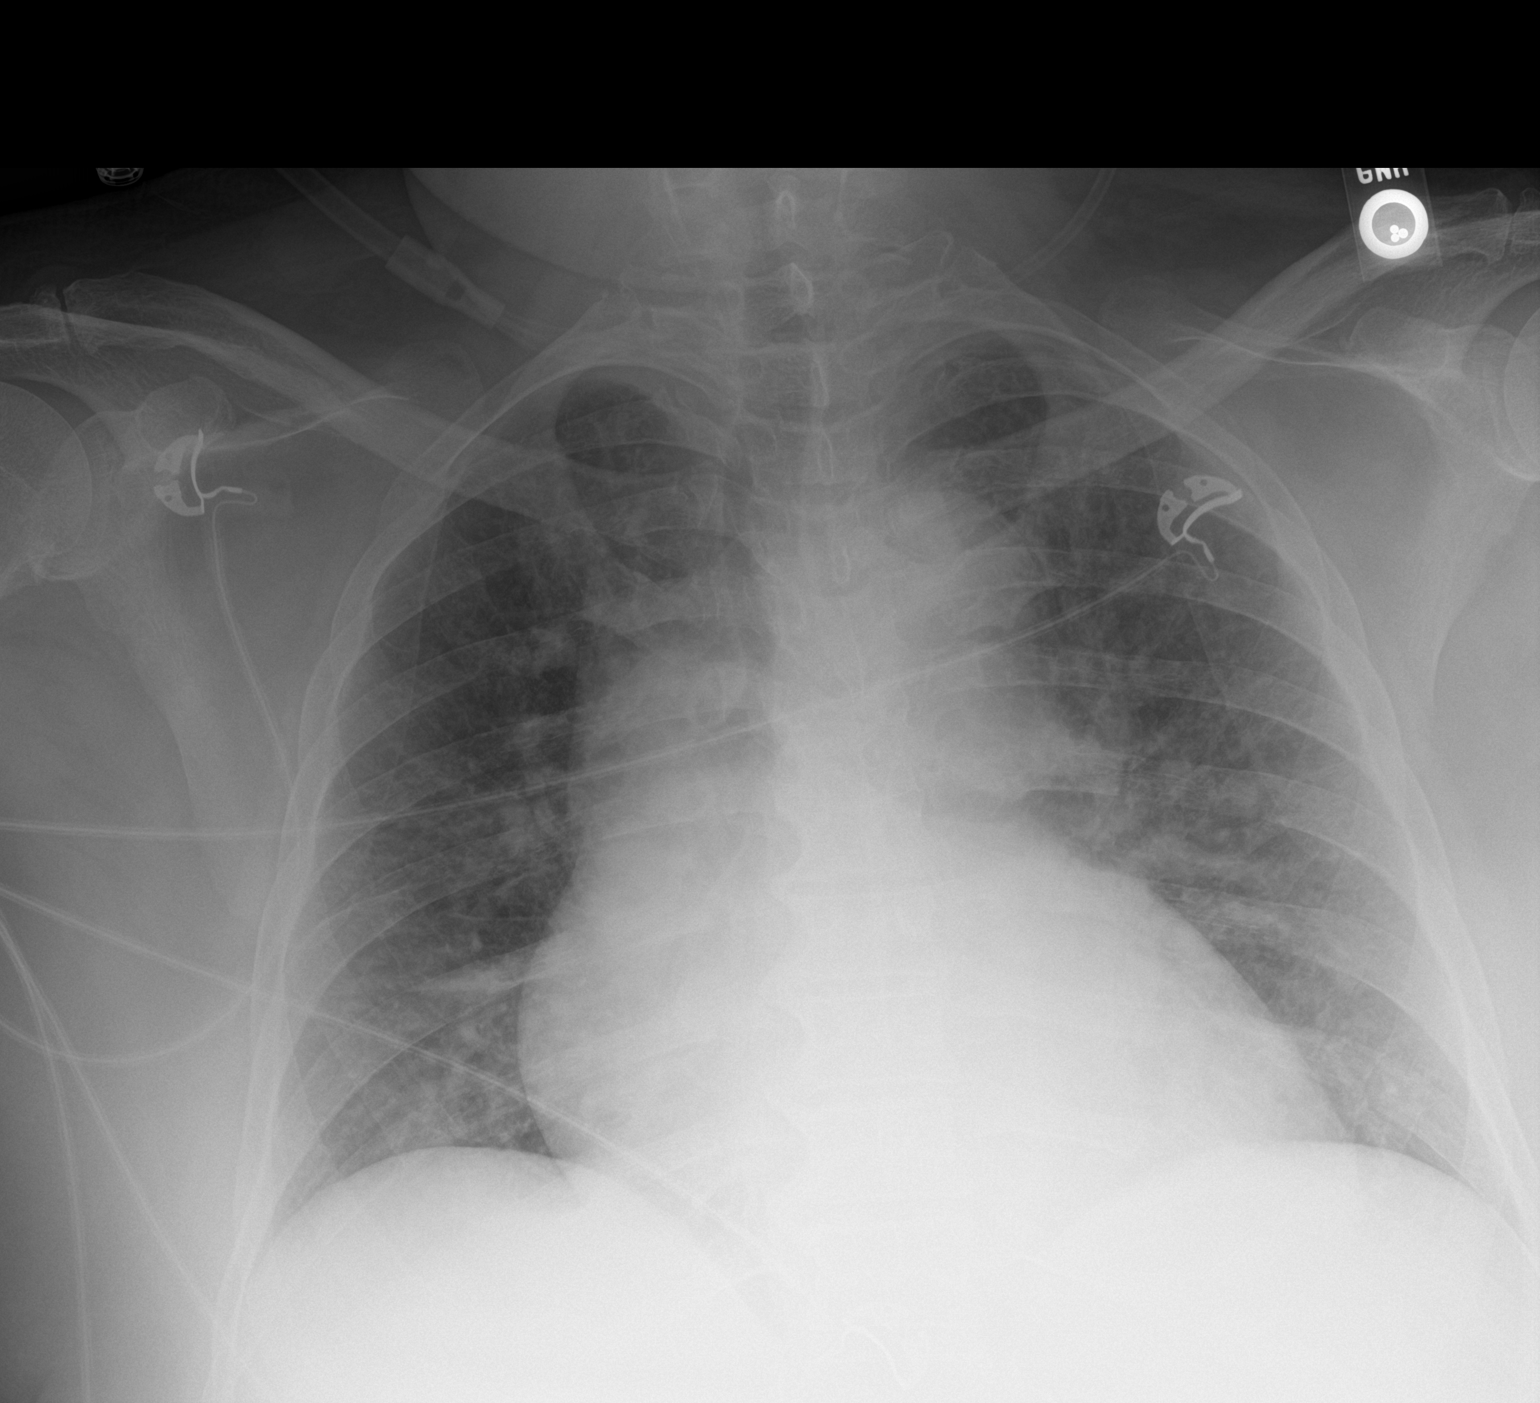

[1 of 1 positions shown; findings below may reference images not displayed]

FINDINGS: Mild, stable diffusely increased lung markings are seen. Very mild
right infrahilar atelectasis is noted. There is no evidence of a
pleural effusion or pneumothorax. The cardiac silhouette is
moderately enlarged and unchanged in size. The visualized skeletal
structures are unremarkable.
IMPRESSION: 1. Very mild right infrahilar atelectasis.
2. Stable cardiomegaly.
3. Stable diffusely increased lung markings which may represent mild
pulmonary edema.

## 2021-01-03 IMAGING — DX DG CHEST 1V PORT
1 series · 1 of 1 positions shown · non-contrast
Comparison: January 01, 2020

CLINICAL DATA: Repositioning of ET tube.

EXAM:
PORTABLE CHEST 1 VIEW

[chest ap]
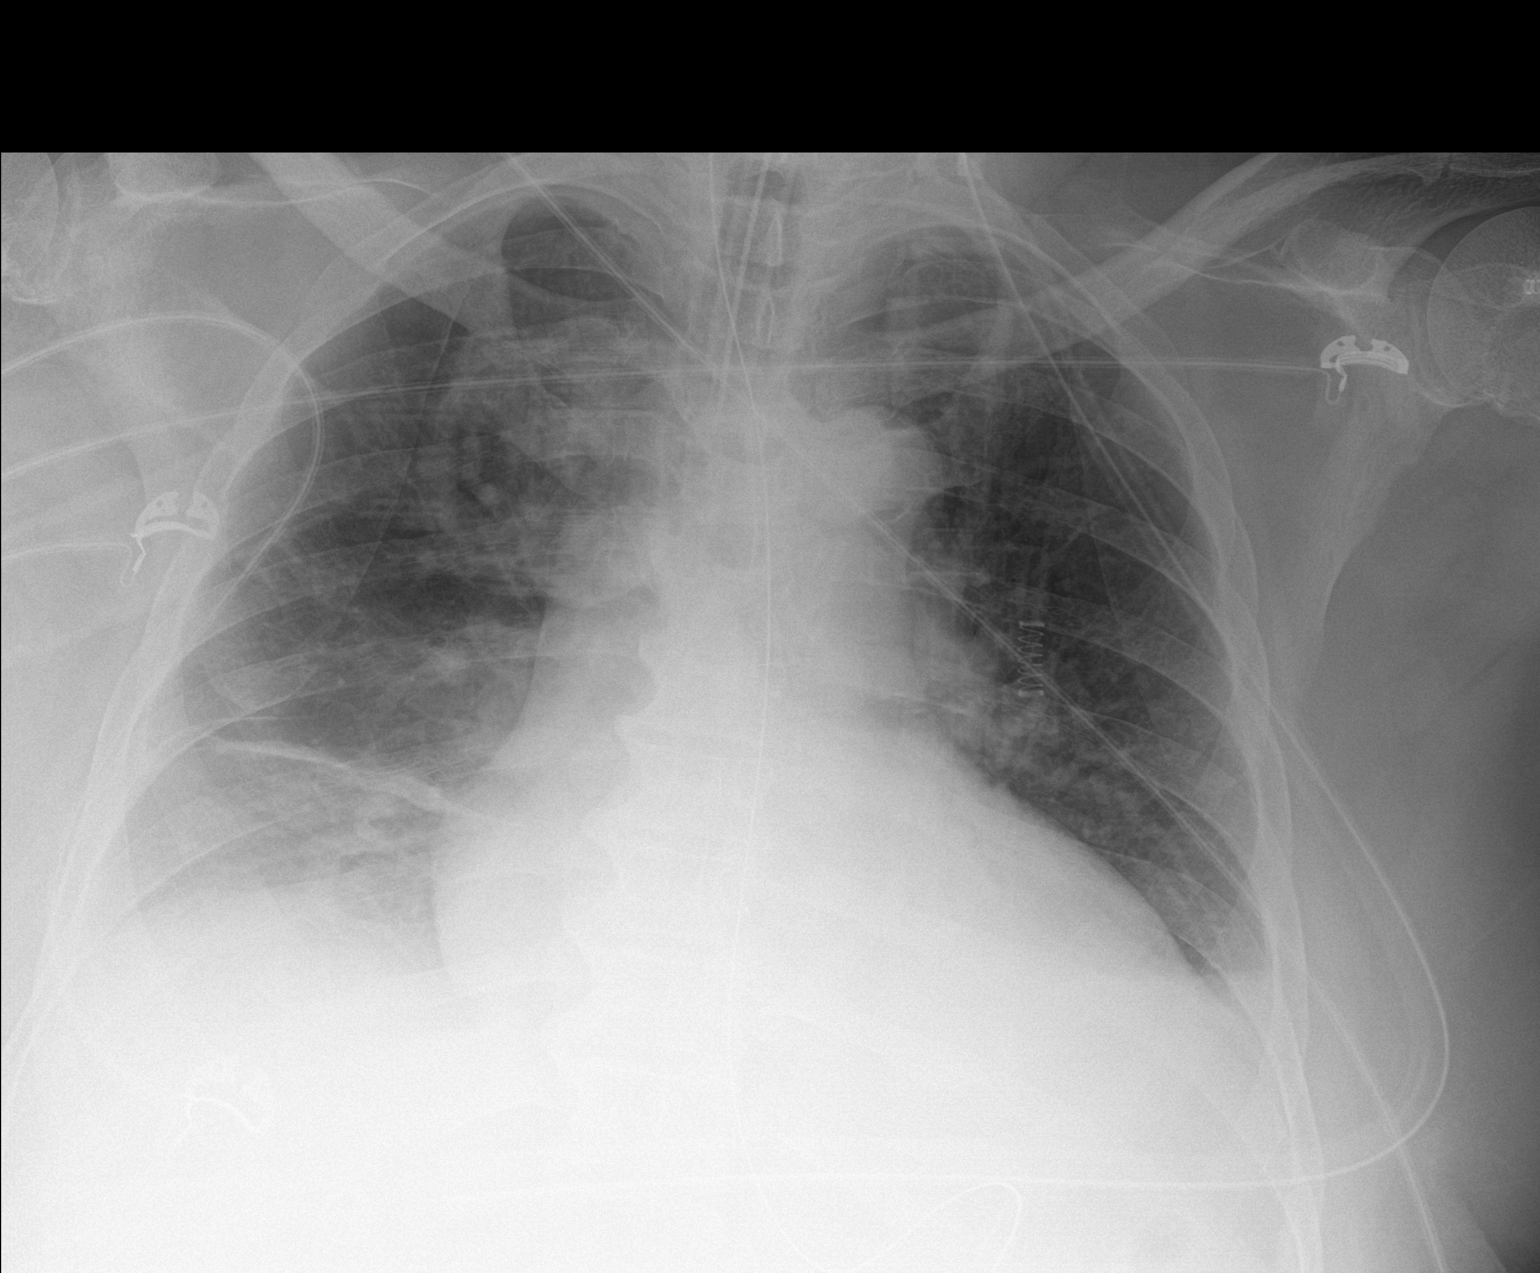

[1 of 1 positions shown; findings below may reference images not displayed]

FINDINGS: The ETT terminates in the mid trachea in good position. The NG tube
terminates below today's film. No pneumothorax. Mild opacity remains
in the right base. No other interval changes.
IMPRESSION: 1. The ETT is in good position. The NG tube terminates below today's
film.
2. Linear opacity in the right base is likely either fluid in a
fissure or atelectasis. Mild haziness over the right base is favored
represent atelectasis rather than infiltrate given the waxing and
waning appearance over the last couple of chest x-rays. Recommend
attention on follow-up.

## 2021-01-03 SURGERY — LITHOTRIPSY, ESWL
Anesthesia: LOCAL | Laterality: Left

## 2021-01-03 MED ORDER — DIAZEPAM 5 MG PO TABS
10.0000 mg | ORAL_TABLET | ORAL | Status: AC
  Administered 2021-01-03: 10 mg via ORAL

## 2021-01-03 MED ORDER — CIPROFLOXACIN HCL 500 MG PO TABS
ORAL_TABLET | ORAL | Status: AC
  Filled 2021-01-03: qty 1

## 2021-01-03 MED ORDER — CIPROFLOXACIN HCL 500 MG PO TABS
500.0000 mg | ORAL_TABLET | ORAL | Status: AC
  Administered 2021-01-03: 500 mg via ORAL

## 2021-01-03 MED ORDER — SODIUM CHLORIDE 0.9 % IV SOLN: INTRAVENOUS | Status: DC

## 2021-01-03 MED ORDER — DIPHENHYDRAMINE HCL 25 MG PO CAPS
ORAL_CAPSULE | ORAL | Status: AC
  Filled 2021-01-03: qty 1

## 2021-01-03 MED ORDER — DIAZEPAM 5 MG PO TABS
ORAL_TABLET | ORAL | Status: AC
  Filled 2021-01-03: qty 2

## 2021-01-03 MED ORDER — DIPHENHYDRAMINE HCL 25 MG PO CAPS
25.0000 mg | ORAL_CAPSULE | ORAL | Status: AC
  Administered 2021-01-03: 25 mg via ORAL

## 2021-01-03 NOTE — Op Note (Signed)
See [facility] OP note scanned into chart. Also because of the size, density, location and other factors that cannot be anticipated I feel this will likely be a staged procedure. This fact supersedes any indication in the scanned [facility] operative note to the contrary.  

## 2021-01-03 NOTE — Discharge Instructions (Signed)
See [facility] discharge instructions in chart.  

## 2021-01-03 NOTE — H&P (Signed)
Please see HP scanned into chart

## 2021-01-04 ENCOUNTER — Encounter (HOSPITAL_BASED_OUTPATIENT_CLINIC_OR_DEPARTMENT_OTHER): Payer: Self-pay | Admitting: Urology

## 2021-01-04 ENCOUNTER — Other Ambulatory Visit (HOSPITAL_COMMUNITY): Payer: Self-pay

## 2021-01-04 ENCOUNTER — Telehealth: Payer: Self-pay | Admitting: Dietician

## 2021-01-04 NOTE — Telephone Encounter (Signed)
Returned patient call. Patient was in the office 12/28/2020 and received a Dexcom starter.  He has not gotten the new prescription for dexcom. Will forward to [place] team.  Antonieta Iba, RD, LDN, CDCES

## 2021-01-08 ENCOUNTER — Other Ambulatory Visit: Payer: Self-pay

## 2021-01-08 ENCOUNTER — Ambulatory Visit (INDEPENDENT_AMBULATORY_CARE_PROVIDER_SITE_OTHER): Payer: Medicare Other | Admitting: Cardiology

## 2021-01-08 ENCOUNTER — Other Ambulatory Visit (HOSPITAL_COMMUNITY): Payer: Self-pay

## 2021-01-08 ENCOUNTER — Other Ambulatory Visit: Payer: Self-pay | Admitting: Endocrinology

## 2021-01-08 ENCOUNTER — Encounter: Payer: Self-pay | Admitting: Cardiology

## 2021-01-08 VITALS — BP 142/80 | HR 70 | Ht 66.0 in | Wt 214.0 lb

## 2021-01-08 DIAGNOSIS — I10 Essential (primary) hypertension: Secondary | ICD-10-CM | POA: Diagnosis not present

## 2021-01-08 DIAGNOSIS — R2681 Unsteadiness on feet: Secondary | ICD-10-CM | POA: Diagnosis not present

## 2021-01-08 DIAGNOSIS — G4733 Obstructive sleep apnea (adult) (pediatric): Secondary | ICD-10-CM | POA: Diagnosis not present

## 2021-01-08 DIAGNOSIS — Z794 Long term (current) use of insulin: Secondary | ICD-10-CM

## 2021-01-08 DIAGNOSIS — I2510 Atherosclerotic heart disease of native coronary artery without angina pectoris: Secondary | ICD-10-CM

## 2021-01-08 DIAGNOSIS — G4700 Insomnia, unspecified: Secondary | ICD-10-CM | POA: Diagnosis not present

## 2021-01-08 DIAGNOSIS — R269 Unspecified abnormalities of gait and mobility: Secondary | ICD-10-CM | POA: Diagnosis not present

## 2021-01-08 DIAGNOSIS — I480 Paroxysmal atrial fibrillation: Secondary | ICD-10-CM

## 2021-01-08 DIAGNOSIS — E7800 Pure hypercholesterolemia, unspecified: Secondary | ICD-10-CM | POA: Diagnosis not present

## 2021-01-08 MED ORDER — ATORVASTATIN CALCIUM 40 MG PO TABS
40.0000 mg | ORAL_TABLET | Freq: Every day | ORAL | 3 refills | Status: DC
  Filled 2021-01-08: qty 90, 90d supply, fill #0
  Filled 2021-04-05: qty 90, 90d supply, fill #1
  Filled 2021-07-01: qty 90, 90d supply, fill #2
  Filled 2021-09-23: qty 90, 90d supply, fill #3

## 2021-01-08 MED ORDER — UNIFINE PENTIPS 32G X 4 MM MISC
Freq: Every day | 0 refills | Status: DC
  Filled 2021-01-08: qty 100, 90d supply, fill #0

## 2021-01-08 NOTE — Patient Instructions (Signed)
Medication Instructions:  Your physician recommends that you continue on your current medications as directed. Please refer to the Current Medication list given to you today.  *If you need a refill on your cardiac medications before your next appointment, please call your pharmacy*   Lab Work: Fasting lipids and ALT  If you have labs (blood work) drawn today and your tests are completely normal, you will receive your results only by: [name] MyChart Message (if you have MyChart) OR . A paper copy in the mail If you have any lab test that is abnormal or we need to change your treatment, we will call you to review the results.   Follow-Up: At [facility], you and your health needs are our priority.  As part of our continuing mission to provide you with exceptional heart care, we have created designated Provider Care Teams.  These Care Teams include your primary Cardiologist (physician) and Advanced Practice Providers (APPs -  Physician Assistants and Nurse Practitioners) who all work together to provide you with the care you need, when you need it.  Your next appointment:   1 year(s)  The format for your next appointment:   In Person  Provider:   You may see [name], MD or one of the following Advanced Practice Providers on your designated Care Team:    [name], [name]  Ermalinda Barrios, [name]

## 2021-01-08 NOTE — Progress Notes (Signed)
Date:  01/08/2021   ID:  [name], DOB 26-Nov-1955, MRN [id number]   PCP:  [name], MD  Cardiologist:  [name], MD  Electrophysiologist:  None   Chief Complaint:  OSA, Afib, CHF, HTN, HLD  History of Present Illness:    Marco [name] is a 65 y.o. male with a hx of combined systolic and diastolic heart failure, nonischemic cardiomyopathy, mild nonobstructive coronary artery disease by cardiac catheterization in 2017, persistent atrial fibrillation with prior history of ablation in [place] in 2017, diabetes, hypertension, chronic kidney disease, sleep apnea, obesity and OSA on CPAP.   His EF was 45-50 but improved to normal by Echo in 01/2017.CHADS2-VASc=4 (CHF, DM, CAD, HTN).Anticoagulation for PAF has been discontinued in the past secondary to anemia. He has a history of chronic dyspnea and chronic chest discomfort. Evaluation by pulmonology included a cardiopulmonary stress test in 2017. However, this was not conclusive due to submaximal effort.He was seen in August 2019 for worsening shortness of breath. An echocardiogram in September 2019 demonstrated worsening LV function with an EF of 35-40%.   Coronary CTA was planned however, the patient was admitted in October 2019 with chest pain and mildly elevated troponin. Echocardiogram demonstrated EF 25-30%. Cardiac catheterization demonstrated mild plaque without obstructive disease. CHF medications were adjusted for nonischemic cardiomyopathy.   He was evaluated by [name] in the advanced heart failure clinic in October 2019. PYP scan was strongly suggestive of transthyretin amyloidosis (grade 2, H/CLL equal 1.94) .  After discussion with [name] with Hematology he felt he did not have myeloma.  Genetic testing negative and myeloma panel with M[facility] but no myeloma.  Now on Tafamadis and followed by AHF clinic. His Entresto, Hydralazine and imdur due to soft BP.   He is here today for  followup and is doing well.  He has chronic DOE that is stable. He tells me at times he has some very mild CP but it is not bothersome at all and unchanged from what he had at the time of his heart cath.  He denies any PND, orthopnea, LE edema, dizziness, palpitations or syncope. He is compliant with his meds and is tolerating meds with no SE.    He is doing well with his BiPAP device and thinks that he has gotten used to it.  He tolerates the mask and feels the pressure is adequate.  Since going on BiPAP he feels rested in the am and has no significant daytime sleepiness.  He denies any significant mouth dryness or nasal congestion.  He does not think that he snores.     Prior CV studies:   The following studies were reviewed today:  Lab, PAP compliance download  Past Medical History:  Diagnosis Date  . Benign colon polyp 08/01/2013   [facility] in [place]. large base tranverse colon polyp was biopsied. polyp was benign with minimal surface hyperplastic change.  . Chronic combined systolic and diastolic CHF (congestive heart failure) ([place])   . Chronic pain   . CKD (chronic kidney disease), stage III ([place])   . Diabetes mellitus without complication ([place])   . Diverticulosis   . Esophageal hiatal hernia 07/29/2013   confirmed on EGD   . Esophageal stricture   . Essential hypertension   . Gastritis 07/29/2013   confirmed on EGD, bx done an negative for intestinal metaplasia, dsyplasia or H. pylori. normal gastric emptying study done 07/13/2013.  [name] GERD (gastroesophageal reflux disease)   .  Morbid obesity ([place])   . Non-obstructive CAD    a. 02/2013 Cath ([place]): nonobs dzs;  b. 09/2014 Myoview ([place]): EF 55%, no ischemia;  c. Cath 05/2018 mild CAD no obstruction.  . Persistent atrial fibrillation ([place])    a. 02/2013 s/p rfca in [place], [place]-->prev on Xarelto, d/c'd 2/2 anemia, ? GIB.  [name] Rheumatoid arthritis ([place])   . Sigmoid diverticulosis 08/01/2013   confirmed on colonscopy.  record scanned into chart   Past Surgical History:  Procedure Laterality Date  . CARDIAC CATHETERIZATION  05/2014   ablation for atrial fibrillation  . CARDIAC CATHETERIZATION N/A 11/16/2015   Procedure: Left Heart Cath and Coronary Angiography;  Surgeon: Leonie Man, MD;  Location: [facility];  Service: Cardiovascular;  Laterality: N/A;  . COLON RESECTION  09/2013   due to large, abnormal polpy. non cancerous per patient.   . COLON SURGERY  09/2014   colon resection   . EXTRACORPOREAL SHOCK WAVE LITHOTRIPSY Left 01/03/2021   Procedure: EXTRACORPOREAL SHOCK WAVE LITHOTRIPSY (ESWL);  Surgeon: Robley Fries, MD;  Location: [facility];  Service: Urology;  Laterality: Left;  . IR PERC PLEURAL DRAIN W/INDWELL CATH W/IMG GUIDE  01/07/2020  . LEFT HEART CATH AND CORONARY ANGIOGRAPHY N/A 05/19/2018   Procedure: LEFT HEART CATH AND CORONARY ANGIOGRAPHY;  Surgeon: Wellington Hampshire, MD;  Location: [facility];  Service: Cardiovascular;  Laterality: N/A;     Current Meds  Medication Sig  . Accu-Chek Softclix Lancets lancets USE AS INSTRUCTED  . albuterol (VENTOLIN HFA) 108 (90 Base) MCG/ACT inhaler INHALE 2 PUFFS INTO THE LUNGS EVERY 6 HOURS AS NEEDED FOR WHEEZING OR SHORTNESS OF BREATH.  [name] allopurinol (ZYLOPRIM) 100 MG tablet Take 2 tablets by mouth daily  . aspirin (EQ ASPIRIN ADULT LOW DOSE) 81 MG EC tablet Take 1 tablet (81 mg total) by mouth daily. TAKE 1 TABLET BY MOUTH ONCE DAILY. SWALLOW WHOLE Please hold asa until you repeat blood work, resume if blood count stable and ok with you primary care doctor  . atorvastatin (LIPITOR) 40 MG tablet Take 1 tablet (40 mg total) by mouth daily. Please keep upcoming appt with [name] in May 2022 before anymore refills. Thank you Final Attempt  . carvedilol (COREG) 12.5 MG tablet TAKE 1 TABLET BY MOUTH TWO TIMES DAILY WITH MEALS  . COSENTYX SENSOREADY PEN 150 MG/ML SOAJ SMARTSIG:1 Pre-Filled Pen Syringe SUB-Q Every 4  Weeks  . dextromethorphan-guaiFENesin (MUCINEX DM) 30-600 MG 12hr tablet Take 1 tablet by mouth 2 (two) times daily as needed for cough.  . diclofenac Sodium (VOLTAREN) 1 % GEL Apply 4 g topically 4 (four) times daily. (Patient taking differently: Apply 4 g topically 4 (four) times daily as needed (pain).)  . DULoxetine (CYMBALTA) 20 MG capsule TAKE 1 CAPSULE BY MOUTH ONCE A DAY  . eszopiclone (LUNESTA) 1 MG TABS tablet TAKE 1 TABLET BY MOUTH EVERY NIGHT IMMEDIATELY BEFORE BEDTIME AS NEEDED FOR SLEEP  . famotidine (PEPCID) 20 MG tablet TAKE 1 TABLET BY MOUTH 2 TIMES DAILY  . fexofenadine (ALLEGRA) 180 MG tablet Take 180 mg by mouth daily.  . fluticasone (FLONASE) 50 MCG/ACT nasal spray PLACE 1 SPRAY INTO BOTH NOSTRILS DAILY AS NEEDED FOR ALLERGIES OR RHINITIS.  . furosemide (LASIX) 40 MG tablet Take 1 tablet (40 mg total) by mouth every evening.  . furosemide (LASIX) 80 MG tablet Take 1 tablet (80 mg total) by mouth every morning.  [name] glucose blood test strip USE TO  TEST 4 TIMES DAILY. (Patient taking differently: USE TO TEST 4 TIMES DAILY.)  . Insulin Lispro Prot & Lispro (HUMALOG MIX 50/50 KWIKPEN) (50-50) 100 UNIT/ML Kwikpen Inject 75 Units into the skin daily with breakfast.  . mupirocin cream (BACTROBAN) 2 % Apply 1 application topically 2 (two) times daily.  . naloxone (NARCAN) nasal spray 4 mg/0.1 mL Place in nostril in the event of suspected overdose on Narcotic medication (Oxycodone)  . oxyCODONE (OXY IR/ROXICODONE) 5 MG immediate release tablet Take 5 mg by mouth 3 (three) times daily as needed for moderate pain.   [name] oxymetazoline (12 HOUR DECONGESTANT) 0.05 % nasal spray Place 1 spray into both nostrils 2 (two) times daily.  . polyethylene glycol (MIRALAX / GLYCOLAX) 17 g packet Take 17 g by mouth daily as needed for moderate constipation or severe constipation.  . potassium chloride SA (KLOR-CON) 20 MEQ tablet Take 1 and 1/2 tablets (30 mEq total) by mouth daily.  . Secukinumab 150 MG/ML  SOAJ INJECT ONE PEN SUBCUTANEOUSLY EVERY 4 WEEKS. REFRIGERATE. ALLOW 15 TO 30 MINUTES AT ROOM TEMP PRIOR TO ADMINISTRATION.  [name] Sodium Chloride-Sodium Bicarb (KETTLE NETI POT SINUS WASH) 2300-700 MG KIT Flush the sinuses twice a week.  Asked pharmacist to show you how to use.  . Tafamidis 61 MG CAPS TAKE 1 CAPSULE (61 MG) BY MOUTH DAILY.  [name] triamcinolone cream (KENALOG) 0.1 % APPLY TO THE AFFECTED AREA(S) TOPICALLY 2 TIMES DAILY AS DIRECTED  . UNIFINE PENTIPS 32G X 4 MM MISC USE AS DIRECTED DAILY     Allergies:   Nsaids and Jardiance [empagliflozin]   Social History   Tobacco Use  . Smoking status: Former Smoker    Packs/day: 0.50    Years: 10.00    Pack years: 5.00    Types: Cigarettes    Quit date: 04/11/2008    Years since quitting: 12.7  . Smokeless tobacco: Never Used  Vaping Use  . Vaping Use: Never used  Substance Use Topics  . Alcohol use: No    Alcohol/week: 0.0 standard drinks  . Drug use: No     Family Hx: The patient's family history includes COPD in his father and mother; Diabetes in his father and mother; Heart Problems in his father; Hypertension in his father and mother; Lung cancer in his mother. There is no history of Colon cancer or Stomach cancer.  ROS:   Please see the history of present illness.     All other systems reviewed and are negative.   Labs/Other Tests and Data Reviewed:    Recent Labs: 05/24/2020: Magnesium 1.8 08/21/2020: B Natriuretic Peptide 106.4 11/06/2020: ALT 7; Hemoglobin 12.6; Platelets 257 11/26/2020: BUN 15; Creatinine, Ser 1.63; Potassium 4.1; Sodium 140   Recent Lipid Panel Lab Results  Component Value Date/Time   CHOL 126 04/22/2019 11:21 AM   TRIG 148 01/03/2020 04:44 AM   HDL 32 (L) 04/22/2019 11:21 AM   CHOLHDL 3.9 04/22/2019 11:21 AM   CHOLHDL 2.6 06/10/2016 12:53 PM   LDLCALC 70 04/22/2019 11:21 AM    Wt Readings from Last 3 Encounters:  01/08/21 214 lb (97.1 kg)  01/03/21 214 lb 9.6 oz (97.3 kg)  12/28/20 217 lb  6.4 oz (98.6 kg)     Objective:    Vital Signs:  BP (!) 142/80   Pulse 70   Ht _0  (1.676 m)   Wt 214 lb (97.1 kg)   SpO2 96%   BMI 34.54 kg/m    GEN: Well nourished, well developed  in no acute distress HEENT: Normal NECK: No JVD; No carotid bruits LYMPHATICS: No lymphadenopathy CARDIAC:RRR, no murmurs, rubs, gallops RESPIRATORY:  Clear to auscultation without rales, wheezing or rhonchi  ABDOMEN: Soft, non-tender, non-distended MUSCULOSKELETAL:  No edema; No deformity  SKIN: Warm and dry NEUROLOGIC:  Alert and oriented x 3 PSYCHIATRIC:  Normal affect    ASSESSMENT & PLAN:    1.  OSA - The patient is tolerating PAP therapy well without any problems. The PAP download performed by his DME was personally reviewed and interpreted by me today and showed an AHI of 1.3/hr on 14/11 cm H2O with 97% compliance in using more than 4 hours nightly.  The patient has been using and benefiting from PAP use and will continue to benefit from therapy.  -Continue prescription drug management with CPAP device and I have ordered new CPAP supplies from DME  2.  HTN -BP is well controlled on exam today -Continue prescription drug management with carvedilol 12.61m BID   3. ASCAD -mild non obstructive dz on cath 2017 -he has chronic very mild CP that he has had since his cath that is unchanged -Continue prescription drug management with ASA 812mdaily, Carvedilol 12.71m58mID and Atorvastatin  4.  PAF -s/p RFCA 2017 -he is maintaining NSR on exam today and denies any palpitations -previously on Xarelto but stopped due to anemia and unknown blood loss  8.  HLD -LDL goal < 70 -check FLP and ALT -Continue prescription drug management with Atorvastatin 75m80mily  Medication Adjustments/Labs and Tests Ordered: Current medicines are reviewed at length with the patient today.  Concerns regarding medicines are outlined above.  Tests Ordered: No orders of the defined types were placed in this  encounter.  Medication Changes: No orders of the defined types were placed in this encounter.   Disposition:  Follow up in 6 month(s)  Signed, TracFransico Him  01/08/2021 3:29 PM    [place]

## 2021-01-08 NOTE — Addendum Note (Signed)
Addended by: Antonieta Iba on: 01/08/2021 03:40 PM   Modules accepted: Orders

## 2021-01-09 ENCOUNTER — Other Ambulatory Visit (HOSPITAL_COMMUNITY): Payer: Self-pay

## 2021-01-10 ENCOUNTER — Other Ambulatory Visit (HOSPITAL_COMMUNITY): Payer: Self-pay

## 2021-01-10 NOTE — Telephone Encounter (Signed)
Patient called to check status.

## 2021-01-11 ENCOUNTER — Other Ambulatory Visit: Payer: Self-pay

## 2021-01-11 ENCOUNTER — Other Ambulatory Visit: Payer: Medicare Other | Admitting: *Deleted

## 2021-01-11 DIAGNOSIS — I2510 Atherosclerotic heart disease of native coronary artery without angina pectoris: Secondary | ICD-10-CM | POA: Diagnosis not present

## 2021-01-11 DIAGNOSIS — E7800 Pure hypercholesterolemia, unspecified: Secondary | ICD-10-CM

## 2021-01-11 LAB — LIPID PANEL
Chol/HDL Ratio: 3.6 ratio (ref 0.0–5.0)
Cholesterol, Total: 97 mg/dL — ABNORMAL LOW (ref 100–199)
HDL: 27 mg/dL — ABNORMAL LOW (ref >39)
LDL Chol Calc (NIH): 41 mg/dL (ref 0–99)
Triglycerides: 172 mg/dL — ABNORMAL HIGH (ref 0–149)
VLDL Cholesterol Cal: 29 mg/dL (ref 5–40)

## 2021-01-11 LAB — ALT: ALT: 8 IU/L (ref 0–44)

## 2021-01-13 DIAGNOSIS — J9601 Acute respiratory failure with hypoxia: Secondary | ICD-10-CM | POA: Diagnosis not present

## 2021-01-14 ENCOUNTER — Other Ambulatory Visit (HOSPITAL_COMMUNITY): Payer: Self-pay

## 2021-01-16 ENCOUNTER — Other Ambulatory Visit (HOSPITAL_COMMUNITY): Payer: Self-pay

## 2021-01-16 ENCOUNTER — Other Ambulatory Visit: Payer: Self-pay | Admitting: Endocrinology

## 2021-01-16 MED ORDER — DEXCOM G6 TRANSMITTER MISC
3 refills | Status: AC
  Filled 2021-01-16: qty 1, fill #0

## 2021-01-16 MED ORDER — DEXCOM G6 RECEIVER DEVI
0 refills | Status: AC
  Filled 2021-01-16: qty 1, 90d supply, fill #0

## 2021-01-16 MED ORDER — DEXCOM G6 SENSOR MISC
3 refills | Status: AC
  Filled 2021-01-16: qty 9, fill #0

## 2021-01-16 NOTE — Addendum Note (Signed)
Addended by: Jacqualin Combes on: 01/16/2021 02:07 PM   Modules accepted: Orders

## 2021-01-16 NOTE — Telephone Encounter (Signed)
Rx for Dexcom receiver, sensors, and transmitters sent

## 2021-01-17 ENCOUNTER — Other Ambulatory Visit (HOSPITAL_COMMUNITY): Payer: Self-pay

## 2021-01-17 DIAGNOSIS — N201 Calculus of ureter: Secondary | ICD-10-CM | POA: Diagnosis not present

## 2021-01-18 ENCOUNTER — Other Ambulatory Visit (HOSPITAL_COMMUNITY): Payer: Self-pay

## 2021-01-18 DIAGNOSIS — M25512 Pain in left shoulder: Secondary | ICD-10-CM | POA: Diagnosis not present

## 2021-01-18 DIAGNOSIS — G894 Chronic pain syndrome: Secondary | ICD-10-CM | POA: Diagnosis not present

## 2021-01-18 DIAGNOSIS — M542 Cervicalgia: Secondary | ICD-10-CM | POA: Diagnosis not present

## 2021-01-18 DIAGNOSIS — G890 Central pain syndrome: Secondary | ICD-10-CM | POA: Diagnosis not present

## 2021-01-18 DIAGNOSIS — G8929 Other chronic pain: Secondary | ICD-10-CM | POA: Diagnosis not present

## 2021-01-18 DIAGNOSIS — E1143 Type 2 diabetes mellitus with diabetic autonomic (poly)neuropathy: Secondary | ICD-10-CM | POA: Diagnosis not present

## 2021-01-18 DIAGNOSIS — M25511 Pain in right shoulder: Secondary | ICD-10-CM | POA: Diagnosis not present

## 2021-01-18 DIAGNOSIS — M79661 Pain in right lower leg: Secondary | ICD-10-CM | POA: Diagnosis not present

## 2021-01-18 DIAGNOSIS — M545 Low back pain, unspecified: Secondary | ICD-10-CM | POA: Diagnosis not present

## 2021-01-18 DIAGNOSIS — M79662 Pain in left lower leg: Secondary | ICD-10-CM | POA: Diagnosis not present

## 2021-01-18 DIAGNOSIS — Z79891 Long term (current) use of opiate analgesic: Secondary | ICD-10-CM | POA: Diagnosis not present

## 2021-01-18 DIAGNOSIS — M5412 Radiculopathy, cervical region: Secondary | ICD-10-CM | POA: Diagnosis not present

## 2021-01-21 ENCOUNTER — Other Ambulatory Visit (HOSPITAL_COMMUNITY): Payer: Self-pay

## 2021-01-21 MED FILL — Secukinumab Subcutaneous Soln Auto-injector 150 MG/ML: SUBCUTANEOUS | 30 days supply | Qty: 1 | Fill #2 | Status: AC

## 2021-01-21 MED FILL — Lancets: 25 days supply | Qty: 100 | Fill #0 | Status: AC

## 2021-01-21 MED FILL — Glucose Blood Test Strip: 90 days supply | Qty: 400 | Fill #0 | Status: CN

## 2021-01-22 ENCOUNTER — Other Ambulatory Visit (HOSPITAL_COMMUNITY): Payer: Self-pay

## 2021-01-22 ENCOUNTER — Other Ambulatory Visit: Payer: Self-pay

## 2021-01-22 ENCOUNTER — Ambulatory Visit (INDEPENDENT_AMBULATORY_CARE_PROVIDER_SITE_OTHER): Payer: Medicare Other | Admitting: Otolaryngology

## 2021-01-22 DIAGNOSIS — J342 Deviated nasal septum: Secondary | ICD-10-CM

## 2021-01-22 DIAGNOSIS — J310 Chronic rhinitis: Secondary | ICD-10-CM

## 2021-01-22 NOTE — Progress Notes (Signed)
HPI: Marco [name] is a 65 y.o. male who presents is referred by his PCP for evaluation of chronic "sinus" complaints.  He has tried Flonase previously but this does not seem to help much.  He has also used 12-hour decongestant spray occasionally that seems to help more.  But he tries not to use it more than 3 days in a row.  He complains of trouble breathing through his nose especially on the left side.[name]  He does not really describe that much pain or discomfort.  Past Medical History:  Diagnosis Date   Benign colon polyp 08/01/2013   [facility] in [place]. large base tranverse colon polyp was biopsied. polyp was benign with minimal surface hyperplastic change.   Chronic combined systolic and diastolic CHF (congestive heart failure) (HCC)    Chronic pain    CKD (chronic kidney disease), stage III (HCC)    Diabetes mellitus without complication ([place])    Diverticulosis    Esophageal hiatal hernia 07/29/2013   confirmed on EGD    Esophageal stricture    Essential hypertension    Gastritis 07/29/2013   confirmed on EGD, bx done an negative for intestinal metaplasia, dsyplasia or H. pylori. normal gastric emptying study done 07/13/2013.   GERD (gastroesophageal reflux disease)    Morbid obesity ([place])    Non-obstructive CAD    a. 02/2013 Cath ([place]): nonobs dzs;  b. 09/2014 Myoview ([place]): EF 55%, no ischemia;  c. Cath 05/2018 mild CAD no obstruction.   Persistent atrial fibrillation ([place])    a. 02/2013 s/p rfca in [place], [place]-->prev on Xarelto, d/c'd 2/2 anemia, ? GIB.   Rheumatoid arthritis ([place])    Sigmoid diverticulosis 08/01/2013   confirmed on colonscopy. record scanned into chart   Past Surgical History:  Procedure Laterality Date   CARDIAC CATHETERIZATION  05/2014   ablation for atrial fibrillation   CARDIAC CATHETERIZATION N/A 11/16/2015   Procedure: Left Heart Cath and Coronary Angiography;  Surgeon: Leonie Man, MD;  Location: [facility];  Service:  Cardiovascular;  Laterality: N/A;   COLON RESECTION  09/2013   due to large, abnormal polpy. non cancerous per patient.    COLON SURGERY  09/2014   colon resection    EXTRACORPOREAL SHOCK WAVE LITHOTRIPSY Left 01/03/2021   Procedure: EXTRACORPOREAL SHOCK WAVE LITHOTRIPSY (ESWL);  Surgeon: Robley Fries, MD;  Location[facility];  Service: Urology;  Laterality: Left;   IR PERC PLEURAL DRAIN W/INDWELL CATH W/IMG GUIDE  01/07/2020   LEFT HEART CATH AND CORONARY ANGIOGRAPHY N/A 05/19/2018   Procedure: LEFT HEART CATH AND CORONARY ANGIOGRAPHY;  Surgeon: Wellington Hampshire, MD;  Location: [facility];  Service: Cardiovascular;  Laterality: N/A;   Social History   Socioeconomic History   Marital status: Married    Spouse name: Amethyst   Number of children: 8   Years of education: 13   Highest education level: Some college, no degree  Occupational History    Comment: disabled  Tobacco Use   Smoking status: Former    Packs/day: 0.50    Years: 10.00    Pack years: 5.00    Types: Cigarettes    Quit date: 04/11/2008    Years since quitting: 12.7   Smokeless tobacco: Never  Vaping Use   Vaping Use: Never used  Substance and Sexual Activity   Alcohol use: No    Alcohol/week: 0.0 standard drinks   Drug use: No   Sexual activity: Yes  Partners: Female    Birth control/protection: None  Other Topics Concern   Not on file  Social History Narrative   Lives with wife. Does not work.  On disability.    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 Provided crisis assistance program information to assist with bills       Has issues getting food- gets $36/month in food stamps- already has list of food pantries but has not tried them- encouraged pt to try food pantries and reach out to clinic for  help if needed   Social Determinants of Health   Financial Resource Strain: Not on file  Food Insecurity: Not on file  Transportation Needs: Not on file  Physical Activity: Not on file  Stress: Not on file  Social Connections: Not on file   Family History  Problem Relation Age of Onset   Hypertension Mother    Diabetes Mother    COPD Mother    Lung cancer Mother        Smoker    Hypertension Father    Diabetes Father    Heart Problems Father    COPD Father    Colon cancer Neg Hx    Stomach cancer Neg Hx    Allergies  Allergen Reactions   Nsaids Other (See Comments)    Stomach pains. Ulcers - stated by patient    Jardiance [Empagliflozin] Nausea And Vomiting   Prior to Admission medications   Medication Sig Start Date End Date Taking? Authorizing Provider  Accu-Chek Softclix Lancets lancets USE AS INSTRUCTED 10/29/20 10/29/21  Renato Shin, MD  albuterol (VENTOLIN HFA) 108 (90 Base) MCG/ACT inhaler INHALE 2 PUFFS INTO THE LUNGS EVERY 6 HOURS AS NEEDED FOR WHEEZING OR SHORTNESS OF BREATH. 07/28/20 07/28/21  [name], MD  allopurinol (ZYLOPRIM) 100 MG tablet Take 2 tablets by mouth daily 11/29/20     aspirin (EQ ASPIRIN ADULT LOW DOSE) 81 MG EC tablet Take 1 tablet (81 mg total) by mouth daily. TAKE 1 TABLET BY MOUTH ONCE DAILY. SWALLOW WHOLE Please hold asa until you repeat blood work, resume if blood count stable and ok with you primary care doctor 05/24/20   Florencia Reasons, MD  atorvastatin (LIPITOR) 40 MG tablet Take 1 tablet (40 mg total) by mouth daily. 01/08/21   Sueanne Margarita, MD  carvedilol (COREG) 12.5 MG tablet TAKE 1 TABLET BY MOUTH TWO TIMES DAILY WITH MEALS 01/02/21 01/02/22  Bensimhon, Shaune Pascal, MD  Continuous Blood Gluc Receiver (DEXCOM G6 RECEIVER) DEVI Use as instructed to monitor blood sugar daily 01/16/21   Renato Shin, MD  Continuous Blood Gluc Sensor (DEXCOM G6 SENSOR) MISC Use as instructed to monitor blood sugar daily. Change every 10 days. 01/16/21    Renato Shin, MD  Continuous Blood Gluc Transmit (DEXCOM G6 TRANSMITTER) MISC Use as instructed to monitor blood sugar daily. Change every 90 days. 01/16/21   Renato Shin, MD  COSENTYX SENSOREADY PEN 150 MG/ML SOAJ SMARTSIG:1 Pre-Filled Pen Syringe SUB-Q Every 4 Weeks 08/22/20   [provider]  dextromethorphan-guaiFENesin (MUCINEX DM) 30-600 MG 12hr tablet Take 1 tablet by mouth 2 (two) times daily as needed for cough. 11/28/20   Kerin Perna, NP  diclofenac Sodium (VOLTAREN) 1 % GEL Apply 4 g topically 4 (four) times daily. Patient taking  differently: Apply 4 g topically 4 (four) times daily as needed (pain). 10/23/19   Darr, Edison Nasuti, [name]  DULoxetine (CYMBALTA) 20 MG capsule TAKE 1 CAPSULE BY MOUTH ONCE A DAY 01/01/21 01/01/22  [name], MD  eszopiclone (LUNESTA) 1 MG TABS tablet TAKE 1 TABLET BY MOUTH EVERY NIGHT IMMEDIATELY BEFORE BEDTIME AS NEEDED FOR SLEEP 01/02/21 07/01/21  [name], MD  famotidine (PEPCID) 20 MG tablet TAKE 1 TABLET BY MOUTH 2 TIMES DAILY 10/29/20 10/29/21  [name], MD  fexofenadine (ALLEGRA) 180 MG tablet Take 180 mg by mouth daily.    [provider]  fluticasone (FLONASE) 50 MCG/ACT nasal spray PLACE 1 SPRAY INTO BOTH NOSTRILS DAILY AS NEEDED FOR ALLERGIES OR RHINITIS. 08/16/20 08/16/21  [name], MD  furosemide (LASIX) 40 MG tablet Take 1 tablet (40 mg total) by mouth every evening. 11/19/20 11/19/21  Consuelo Pandy, [name]  furosemide (LASIX) 80 MG tablet Take 1 tablet (80 mg total) by mouth every morning. 11/19/20   Lyda Jester M, [name]  glucose blood test strip USE TO TEST 4 TIMES DAILY. Patient taking differently: USE TO TEST 4 TIMES DAILY. 08/23/20 08/23/21  Renato Shin, MD  Insulin Lispro Prot & Lispro (HUMALOG MIX 50/50 KWIKPEN) (50-50) 100 UNIT/ML Kwikpen Inject 75 Units into the skin daily with breakfast. 12/28/20   Renato Shin, MD  Insulin Pen Needle (UNIFINE PENTIPS) 32G X 4 MM MISC USE AS DIRECTED  DAILY 01/08/21 01/08/22  Renato Shin, MD  mupirocin cream (BACTROBAN) 2 % Apply 1 application topically 2 (two) times daily. 10/17/20   Mayers, Cari S, [name]  naloxone (NARCAN) nasal spray 4 mg/0.1 mL Place in nostril in the event of suspected overdose on Narcotic medication (Oxycodone) 02/16/20   [name], MD  oxyCODONE (OXY IR/ROXICODONE) 5 MG immediate release tablet Take 5 mg by mouth 3 (three) times daily as needed for moderate pain.  01/24/20   [provider]  oxymetazoline (12 HOUR DECONGESTANT) 0.05 % nasal spray Place 1 spray into both nostrils 2 (two) times daily. 11/28/20   Kerin Perna, NP  polyethylene glycol (MIRALAX / GLYCOLAX) 17 g packet Take 17 g by mouth daily as needed for moderate constipation or severe constipation. 01/14/20   Lavina Hamman, MD  potassium chloride SA (KLOR-CON) 20 MEQ tablet Take 1 and 1/2 tablets (30 mEq total) by mouth daily. 11/19/20 11/19/21  Rosita Fire, Brittainy M, [name]  Secukinumab 150 MG/ML SOAJ INJECT ONE PEN SUBCUTANEOUSLY EVERY 4 WEEKS. REFRIGERATE. ALLOW 15 TO 30 MINUTES AT ROOM TEMP PRIOR TO ADMINISTRATION. 08/22/20 08/22/21  Hermelinda Medicus, MD  Sodium Chloride-Sodium Bicarb (KETTLE NETI POT SINUS WASH) 2300-700 MG KIT Flush the sinuses twice a week.  Asked pharmacist to show you how to use. 12/17/20   [name], MD  Tafamidis 61 MG CAPS TAKE 1 CAPSULE (61 MG) BY MOUTH DAILY. 09/12/20 09/12/21  Bensimhon, Shaune Pascal, MD  triamcinolone cream (KENALOG) 0.1 % APPLY TO THE AFFECTED AREA(S) TOPICALLY 2 TIMES DAILY AS DIRECTED 12/17/20 12/17/21  [name], MD  UNIFINE PENTIPS 32G X 4 MM MISC USE AS DIRECTED DAILY 06/19/20   Renato Shin, MD  hydrALAZINE (APRESOLINE) 25 MG tablet Take 1 tablet (25 mg total) by mouth 3 (three) times daily. 08/21/20 11/05/20  Bensimhon, Shaune Pascal, MD  isosorbide mononitrate (IMDUR) 30 MG 24 hr tablet Take 1 tablet (30 mg total) by mouth daily. 08/21/20 11/05/20  Bensimhon, Shaune Pascal, MD  ramelteon (ROZEREM) 8  MG tablet  TAKE 1 TABLET BY MOUTH AT BEDTIME 09/03/20 11/05/20  [name], MD     Positive ROS: Otherwise negative  All other systems have been reviewed and were otherwise negative with the exception of those mentioned in the HPI and as above.  Physical Exam: Constitutional: Alert, well-appearing, no acute distress Ears: External ears without lesions or tenderness. Ear canals are clear bilaterally with intact, clear TMs.  Nasal: External nose without lesions. Septum is mildly deviated to the left.  After decongesting the nose the left middle meatus is a little bit difficult to visualize because of the deviation of the septum but he has moderate mucosal swelling more so on the left side than the right.  The right middle meatus region was clear.  Did not notice any obvious mucopurulent discharge. Oral: Lips and gums without lesions. Tongue and palate mucosa without lesions. Posterior oropharynx clear. Neck: No palpable adenopathy or masses Respiratory: Breathing comfortably  Skin: No facial/neck lesions or rash noted.  Procedures  Assessment: Septal deviation to the left with worse nasal congestion on the left side. I also reviewed an MRI scan performed a year ago that showed mild to moderate left-sided sinus congestion and partial obstruction.  The right side was relatively clear. Chronic rhinitis.  Plan: Reviewed with Jori Moll and his wife concerning regular use of nasal steroid spray and prescribed Nasacort 2 sprays each nostril at night.  I also prescribed azelastine nasal spray 1 spray twice daily.  And reviewed with them concerning use of saline nasal irrigations during the daytime to help with excessive mucus and nasal congestion. He has mupirocin ointment to use when he occasionally gets sores in his nose. Briefly discussed with him that in order for him to adequately breathe through his nose he would require surgery to straighten the septum and open up the sinuses especially on  the left side however he has several health problems including coronary artery disease status post catheterization and stent placement as well as congestive heart failure.  He also has obstructive sleep apnea. Would recommend medical treatment initially with use of the nasal steroid spray and antihistamine nasal spray as well as a saline irrigations.  I would limit the use of decongestant nasal spray to only once or twice a week. Discussed with him concerning checking with his cardiologist to see if surgery would be high risk or not. He will follow-up as needed.   Radene Journey, MD   CC:

## 2021-01-23 ENCOUNTER — Telehealth: Payer: Self-pay | Admitting: Internal Medicine

## 2021-01-23 ENCOUNTER — Telehealth: Payer: Self-pay | Admitting: Nutrition

## 2021-01-23 NOTE — Telephone Encounter (Signed)
Copied from [place] [id number]. Topic: General - Other >> Jan 18, 2021  9:05 AM Alanda Slim E wrote: Reason for CRM: Pt is scheduled at 1:30 today for [facility] but he put the wrong DOS on the form he completed online / he needs to complete again online to be seen / they told office to advise him of this/ please advise or they can not see him at appt time

## 2021-01-23 NOTE — Telephone Encounter (Signed)
Returned call cause I couldn't understand the message and spoke to [place] and per [place] message was not for [place]

## 2021-01-23 NOTE — Telephone Encounter (Signed)
Patient reports that he has still not gotten his Dexcom.  [facility] contacted and they said they emailed him with a link to get information about where to get his Dexcom.  He was called back and told this.  He will check his emails and click the link.  He was also given their number to call if he can not find the email.

## 2021-01-24 ENCOUNTER — Other Ambulatory Visit: Payer: Self-pay

## 2021-01-24 ENCOUNTER — Ambulatory Visit: Payer: Medicare Other | Attending: Internal Medicine | Admitting: Internal Medicine

## 2021-02-04 ENCOUNTER — Other Ambulatory Visit (HOSPITAL_COMMUNITY): Payer: Self-pay

## 2021-02-04 ENCOUNTER — Other Ambulatory Visit: Payer: Self-pay | Admitting: Internal Medicine

## 2021-02-04 MED ORDER — FAMOTIDINE 20 MG PO TABS
ORAL_TABLET | Freq: Two times a day (BID) | ORAL | 2 refills | Status: DC
  Filled 2021-02-04: qty 60, 30d supply, fill #0
  Filled 2021-03-06: qty 60, 30d supply, fill #1
  Filled 2021-04-05: qty 60, 30d supply, fill #2

## 2021-02-04 MED FILL — Glucose Blood Test Strip: 25 days supply | Qty: 400 | Fill #0 | Status: CN

## 2021-02-04 MED FILL — Tafamidis Cap 61 MG: ORAL | 30 days supply | Qty: 30 | Fill #2 | Status: AC

## 2021-02-04 MED FILL — Glucose Blood Test Strip: 90 days supply | Qty: 400 | Fill #0 | Status: CN

## 2021-02-07 ENCOUNTER — Encounter: Payer: Medicare Other | Admitting: Internal Medicine

## 2021-02-08 DIAGNOSIS — G4700 Insomnia, unspecified: Secondary | ICD-10-CM | POA: Diagnosis not present

## 2021-02-08 DIAGNOSIS — R2681 Unsteadiness on feet: Secondary | ICD-10-CM | POA: Diagnosis not present

## 2021-02-08 DIAGNOSIS — R269 Unspecified abnormalities of gait and mobility: Secondary | ICD-10-CM | POA: Diagnosis not present

## 2021-02-08 DIAGNOSIS — H04123 Dry eye syndrome of bilateral lacrimal glands: Secondary | ICD-10-CM | POA: Diagnosis not present

## 2021-02-08 DIAGNOSIS — Z961 Presence of intraocular lens: Secondary | ICD-10-CM | POA: Diagnosis not present

## 2021-02-08 DIAGNOSIS — G4733 Obstructive sleep apnea (adult) (pediatric): Secondary | ICD-10-CM | POA: Diagnosis not present

## 2021-02-08 DIAGNOSIS — H10413 Chronic giant papillary conjunctivitis, bilateral: Secondary | ICD-10-CM | POA: Diagnosis not present

## 2021-02-08 DIAGNOSIS — E119 Type 2 diabetes mellitus without complications: Secondary | ICD-10-CM | POA: Diagnosis not present

## 2021-02-08 LAB — HM DIABETES EYE EXAM

## 2021-02-12 ENCOUNTER — Other Ambulatory Visit (HOSPITAL_COMMUNITY): Payer: Self-pay

## 2021-02-12 ENCOUNTER — Telehealth: Payer: Self-pay | Admitting: Endocrinology

## 2021-02-12 DIAGNOSIS — J9601 Acute respiratory failure with hypoxia: Secondary | ICD-10-CM | POA: Diagnosis not present

## 2021-02-12 MED FILL — Glucose Blood Test Strip: 88 days supply | Qty: 350 | Fill #0 | Status: AC

## 2021-02-12 MED FILL — Glucose Blood Test Strip: 87 days supply | Qty: 350 | Fill #0 | Status: CN

## 2021-02-12 NOTE — Telephone Encounter (Signed)
[facility] called to see if we received a fax sent on 6/29 for CMN form and chart notes for pt Dexcom equipment.

## 2021-02-13 ENCOUNTER — Other Ambulatory Visit (HOSPITAL_COMMUNITY): Payer: Self-pay

## 2021-02-13 DIAGNOSIS — M5412 Radiculopathy, cervical region: Secondary | ICD-10-CM | POA: Diagnosis not present

## 2021-02-13 DIAGNOSIS — M25512 Pain in left shoulder: Secondary | ICD-10-CM | POA: Diagnosis not present

## 2021-02-13 DIAGNOSIS — M542 Cervicalgia: Secondary | ICD-10-CM | POA: Diagnosis not present

## 2021-02-13 DIAGNOSIS — G890 Central pain syndrome: Secondary | ICD-10-CM | POA: Diagnosis not present

## 2021-02-13 DIAGNOSIS — M25511 Pain in right shoulder: Secondary | ICD-10-CM | POA: Diagnosis not present

## 2021-02-13 DIAGNOSIS — G894 Chronic pain syndrome: Secondary | ICD-10-CM | POA: Diagnosis not present

## 2021-02-13 DIAGNOSIS — E1143 Type 2 diabetes mellitus with diabetic autonomic (poly)neuropathy: Secondary | ICD-10-CM | POA: Diagnosis not present

## 2021-02-13 DIAGNOSIS — G8929 Other chronic pain: Secondary | ICD-10-CM | POA: Diagnosis not present

## 2021-02-13 DIAGNOSIS — M79662 Pain in left lower leg: Secondary | ICD-10-CM | POA: Diagnosis not present

## 2021-02-13 DIAGNOSIS — Z79891 Long term (current) use of opiate analgesic: Secondary | ICD-10-CM | POA: Diagnosis not present

## 2021-02-13 DIAGNOSIS — M79661 Pain in right lower leg: Secondary | ICD-10-CM | POA: Diagnosis not present

## 2021-02-13 DIAGNOSIS — M545 Low back pain, unspecified: Secondary | ICD-10-CM | POA: Diagnosis not present

## 2021-02-15 ENCOUNTER — Other Ambulatory Visit: Payer: Self-pay

## 2021-02-15 ENCOUNTER — Telehealth: Payer: Self-pay

## 2021-02-15 ENCOUNTER — Other Ambulatory Visit (HOSPITAL_COMMUNITY): Payer: Self-pay

## 2021-02-15 NOTE — Telephone Encounter (Signed)
CMN and chart notes has been faxed to [facility]

## 2021-02-17 NOTE — Progress Notes (Signed)
HPI male former smoker followed for OSA/ Insomnia, dyspnea on exertion, complicated by chronic pain, anxiety, DM 2, history cardiac ablation, gout NPSG 37.8/hour on 06/14/2015, desaturation to 76%, body weight 223 pounds BiPAP titration 08/19/2015- to 20/16 Barium Swallow 02/07/2016-normal CXR 01/22/2016-NAD PFT 02/22/2016-minimal restriction, mild diffusion deficit. FVC 2.62/76%, FEV1 2.14/81%, ratio 0.82, DLCO 61%, TLC 79%, RV/TLC 135%. 6MWT- 02/22/16-97%, 98%, 98%. Stopped after only 82 m complaining of chest tightness, headache, shortness of breath, dizziness. BP and heart rate were unremarkable. Unattended Home Sleep Test-05/25/2016-AHI 36.9/hour, desaturation to 82%, body weight 212 pounds   ----------------------------------------------------------------------------   08/30/20- 65 year old male former smoker followed for OSA/ Insomnia, dyspnea on exertion, complicated by chronic pain, anxiety, DM 2/retinopathy, history cardiac ablation, gout/arthritis, CHF/ CM, CKD, HBP, GERD, psoriasis, Gout, Obesity,  -Ventolin hfa, Flonase, Rozerem 8 mg,  VPAP auto Max IPAP14/  Min EPAP 11, PS2/ [facility]       AirCurve 10 V Auto>> today change to 12/9 Download- compliance 100%, AHI 3.3/ hr         Body weight today- 220 lbs Covid vax- 3 Phizer Flu vax- Had cardiology f/ulast week w latest EF 45-50%. Last summer had urinary sepsis, pyelo, renal capsular hematoma, hepatic abscess. Reports occasional trace heme in phlegm with morning cough. He thinks his full-face mask dries out his nose to cause a little epistaxis. Continues BASA.  CXR 06/01/20- 1V- IMPRESSION: Atelectatic change right minor fissure region. Lungs otherwise clear. Stable cardiac silhouette. Calcification noted in each carotid artery.  02/18/21-  65 year old male former smoker followed for OSA/ Insomnia, dyspnea on exertion, complicated by chronic pain, anxiety, DM 2/retinopathy, history cardiac ablation, gout/arthritis, CHF/ CM, CKD,  HBP, GERD, psoriasis, Gout, Obesity,  -Ventolin hfa, Flonase, Lunesta 1 mg,  VPAP auto Max IPAP14  Min EPAP 11 PS2/ [facility]       AirCurve 10 V Auto Download- compliance 97%, AHI 1.4 hr Body weight today-219 lbs Covid vax- Saw Dr [facility]-Chronic Rhinitis>> Nasacort, Azelastine. May be candidate for septoplasty if co-morbidities not prohibitive. C/o dryness. Download reviewed. Pt doesn't want nasal surgery. Discussed drying by antihistamine nasal spray. Lunesta helps sleep. CXR 08/31/20- IMPRESSION: Coarsened interstitial markings favored to be chronic, without evidence of confluent airspace disease.   ROS-see HPI    "+" = positive Constitutional:    weight loss, night sweats, fevers, chills, fatigue, lassitude. HEENT:    headaches, + difficulty swallowing, tooth/dental problems, sore throat,       sneezing, itching, ear ache, nasal congestion, post nasal drip, snoring CV:     chest pain, orthopnea, PND, + swelling in lower extremities, anasarca,                                                          dizziness, palpitations Resp:  + shortness of breath with exertion or at rest.                productive cough,  + non-productive cough,+? coughing up of blood.              change in color of mucus.  wheezing.   Skin:    rash or lesions. GI:  No-   heartburn, indigestion, + abdominal pain, nausea, vomiting, diarrhea,                 change  in bowel habits, loss of appetite GU: dysuria, change in color of urine, no urgency or frequency.   flank pain. MS:   + joint pain, stiffness, decreased range of motion, back pain. Neuro-     nothing unusual Psych:  change in mood or affect.  depression or anxiety.   memory loss.  OBJ- Physical Exam General- Alert, Oriented, Affect-appropriate, Distress- none acute, + obese,  Skin- +psoriasis fingers Lymphadenopathy- none Head- atraumatic            Eyes- Gross vision intact, PERRLA, conjunctivae and secretions clear            Ears- Hearing,  canals-normal            Nose- Clear, no-Septal dev, mucus, polyps, erosion, perforation             Throat- Mallampati IV , mucosa clear-not particularly dry , drainage- none, tonsils- atrophic,                  +many missing teeth Neck- flexible , trachea midline, no stridor , thyroid nl, carotid no bruit Chest - symmetrical excursion , unlabored           Heart/CV- RRR , no murmur , no gallop  , no rub, nl s1 s2                           - JVD- none , edema- none, stasis changes- none, varices- none           Lung- clear to P&A, wheeze- none, cough- none , dullness-none, rub- none           Chest wall-  Abd-  Br/ Gen/ Rectal- Not done, not indicated Extrem- cyanosis- none, clubbing, none, atrophy- none, strength- nl Neuro- grossly intact to observation

## 2021-02-18 ENCOUNTER — Other Ambulatory Visit (HOSPITAL_COMMUNITY): Payer: Self-pay

## 2021-02-18 ENCOUNTER — Other Ambulatory Visit: Payer: Self-pay

## 2021-02-18 ENCOUNTER — Encounter: Payer: Self-pay | Admitting: Internal Medicine

## 2021-02-18 ENCOUNTER — Ambulatory Visit (INDEPENDENT_AMBULATORY_CARE_PROVIDER_SITE_OTHER): Payer: Medicare Other | Admitting: Internal Medicine

## 2021-02-18 DIAGNOSIS — G4733 Obstructive sleep apnea (adult) (pediatric): Secondary | ICD-10-CM

## 2021-02-18 DIAGNOSIS — J9601 Acute respiratory failure with hypoxia: Secondary | ICD-10-CM | POA: Diagnosis not present

## 2021-02-18 NOTE — Patient Instructions (Signed)
We can continue VPAP 19/12  Suggest you try turning up the humidifier on your VPAP machine a little for dryness. The [facility] home [facility] company can talk you through how to make the adjustment if needed.  Try otc nasal saline gel- a little in each nostril at bedtime and whenever needed for dry nose. Any brand- store brand, AYR, NeilMed, etc.  Please call if we can help

## 2021-02-19 ENCOUNTER — Ambulatory Visit (INDEPENDENT_AMBULATORY_CARE_PROVIDER_SITE_OTHER): Payer: Medicare Other | Admitting: Nurse Practitioner

## 2021-02-19 ENCOUNTER — Other Ambulatory Visit (HOSPITAL_COMMUNITY): Payer: Self-pay

## 2021-02-19 VITALS — BP 158/79 | HR 68 | Temp 97.9°F | Resp 16 | Ht 66.0 in | Wt 217.0 lb

## 2021-02-19 DIAGNOSIS — Z0000 Encounter for general adult medical examination without abnormal findings: Secondary | ICD-10-CM

## 2021-02-19 MED ORDER — COSENTYX SENSOREADY PEN 150 MG/ML ~~LOC~~ SOAJ
SUBCUTANEOUS | 1 refills | Status: DC
  Filled 2021-02-19: qty 1, 28d supply, fill #0
  Filled 2021-03-12: qty 1, 28d supply, fill #1
  Filled 2021-04-23: qty 1, 28d supply, fill #2
  Filled 2021-05-17: qty 1, 28d supply, fill #3
  Filled 2021-06-20: qty 1, 28d supply, fill #4
  Filled 2021-07-08: qty 1, 28d supply, fill #5

## 2021-02-19 NOTE — Patient Instructions (Addendum)
Marco [name] , Thank you for taking time to come for your Medicare Wellness Visit. I appreciate your ongoing commitment to your health goals. Please review the following plan we discussed and let me know if I can assist you in the future.   These are the goals we discussed:  Goals     . DIET - REDUCE FAT INTAKE        This is a list of the screening recommended for you and due dates:  Health Maintenance  Topic Date Due  . Urine Protein Check  05/15/2016  . COVID-19 Vaccine (3 - Pfizer risk series) 12/22/2019  . Colon Cancer Screening  04/18/2020  . Zoster (Shingles) Vaccine (2 of 2) 11/09/2020  . Pneumonia vaccines (1 of 2 - PCV13) 10/17/2021*  . Flu Shot  03/11/2021  . Hemoglobin A1C  06/30/2021  . Complete foot exam   12/28/2021  . Eye exam for diabetics  02/08/2022  . Hepatitis C Screening: USPSTF Recommendation to screen - Ages 65-79 yo.  Completed  . HPV Vaccine  Aged Out  . Tetanus Vaccine  Discontinued  *Topic was postponed. The date shown is not the original due date.    Screening for Type 2 Diabetes  A screening test for type 2 diabetes (type 2 diabetes mellitus) is a blood test to measure your blood sugar (glucose) level. This test is done to check for early signs of diabetes, before youdevelop symptoms.  Type 2 diabetes is a long-term (chronic) disease. In type 2 diabetes, one or both of these problems may be present: The pancreas does not make enough of a hormone called insulin. Cells in the body do not respond properly to insulin that the body makes (insulin resistance). Normally, insulin allows blood sugar (glucose) to enter cells in the body. The cells use glucose for energy. Insulin resistance or lack of insulin causes excess glucose to build up in the blood instead of going into cells. This results in high blood glucose levels (hyperglycemia), which can cause many complications. You may be screened for type 2 diabetes as part of your regular health care, especially  if you have a high risk for diabetes. Screening can help to identify type 2 diabetes at its early stage (prediabetes). Identifying and treating prediabetes may delay or prevent the development oftype 2 diabetes. What are the risk factors for type 2 diabetes? The following factors may make you more likely to develop type 2 diabetes: Having a parent or sibling (first-degree relative) who has diabetes. Being overweight or obese. Being of American-Indian, African-American, Hispanic, Latino, Asian, or [place] descent. Not getting enough exercise (having a sedentary lifestyle). Being older than age 70. Having a history of diabetes during pregnancy (gestational diabetes). Having low levels of good cholesterol (HDL-C) or high levels of blood fats (triglycerides). Having high blood glucose in a previous blood test. Having high blood pressure. Having certain diseases or conditions that may be caused by insulin resistance, including: Acanthosis nigricans. This is a condition that causes dark skin on the neck, armpits, and groin. Polycystic ovary syndrome (PCOS). Cardiovascular heart disease. Who should be screened for type 2 diabetes? Adults Adults age 5 and older. These adults should be screened once every three years. Adults who are younger than age 64, are overweight, and have one other risk factor. These adults should be screened once every three years. Adults who have normal blood glucose levels and two or more risk factors. These adults may be screened once every year (annually). Women  who have had gestational diabetes in the past. These women should be screened once every three years. Pregnant women who have risk factors. These women should be screened at their first prenatal visit and again between weeks 24 and 28 of pregnancy. Children and adolescents Children and adolescents should be screened for type 2 diabetes if they are overweight and have any of the following risk factors: A  family history of type 2 diabetes. Being a member of a high-risk ethnic group. Signs of insulin resistance or conditions that are associated with insulin resistance. A mother who had gestational diabetes while pregnant with him or her. Screening should be done at least once every three years, starting at age 74 or at the onset of puberty, whichever comes first. Your health care provider or your child's health care provider may recommendhaving a screening more or less often. What happens during screening? During screening, your health care provider may ask questions about: Your health and your risk factors, including your activity level and any medical conditions that you have. The health of your first-degree relatives. Past pregnancies, if this applies. Your health care provider will also do a physical exam, including a blood pressure measurement and blood tests. There are four blood tests that can be used to screen for type 2 diabetes. You may have one or more of the following: A fasting blood glucose (FBG) test. You will not be allowed to eat (you will fast) for 8 hours or more before a blood sample is taken. A random blood glucose test. This test checks your blood glucose at any time of the day regardless of when you ate. An oral glucose tolerance test (OGTT). This test measures your blood glucose at two times: After you have not eaten (have fasted) overnight. This is your baseline glucose level. Two hours after you drink a glucose-containing beverage. An A1c (hemoglobin A1c) blood test. This test provides information about blood glucose control over the previous 2-3 months. What do the results mean? Your test results are a measurement of how much glucose is in your blood. Normal blood glucose levels mean that you do not have diabetes or prediabetes. High blood glucose levels may mean that you have prediabetes or diabetes.Depending on the results, other tests may be needed to confirm the  diagnosis. You may be diagnosed with type 2 diabetes if: Your FBG level is 126 mg/dL (7.0 mmol/L) or higher. Your random blood glucose level is 200 mg/dL (11.1 mmol/L) or higher. Your A1c level is 6.5% or higher. Your OGTT result is higher than 200 mg/dL (11.1 mmol/L). These blood tests may be repeated to confirm your diagnosis. Talk with yourhealth care provider about what your results mean. Summary A screening test for type 2 diabetes (type 2 diabetes mellitus) is a blood test to measure your blood sugar (glucose) level. Know what your risk factors are for developing type 2 diabetes. If you are at risk, get screening tests as often as told by your health care provider. Screening may help you identify type 2 diabetes at its early stage (prediabetes). Identifying and treating prediabetes may delay or prevent the development of type 2 diabetes. This information is not intended to replace advice given to you by your health care provider. Make sure you discuss any questions you have with your healthcare provider. Document Revised: 07/11/2020 Document Reviewed: 02/22/2020 Elsevier Patient Education  2022 [place].  Diabetes Mellitus and [place] care is an important part of your health, especially when you have  diabetes. Diabetes may cause you to have problems because of poor blood flow (circulation) to your feet and legs, which can cause your skin to: Become thinner and drier. Break more easily. Heal more slowly. Peel and crack. You may also have nerve damage (neuropathy) in your legs and feet, causing decreased feeling in them. This means that you may not notice minor injuries to your feet that could lead to more serious problems. Noticing and addressing any potential problems early is the best wayto prevent future foot problems. How to care for your feet Foot hygiene  Wash your feet daily with warm water and mild soap. Do not use hot water. Then, pat your feet and the areas  between your toes until they are completely dry. Do not soak your feet as this can dry your skin. Trim your toenails straight across. Do not dig under them or around the cuticle. File the edges of your nails with an emery board or nail file. Apply a moisturizing lotion or petroleum jelly to the skin on your feet and to dry, brittle toenails. Use lotion that does not contain alcohol and is unscented. Do not apply lotion between your toes.  Shoes and socks Wear clean socks or stockings every day. Make sure they are not too tight. Do not wear knee-high stockings since they may decrease blood flow to your legs. Wear shoes that fit properly and have enough cushioning. Always look in your shoes before you put them on to be sure there are no objects inside. To break in new shoes, wear them for just a few hours a day. This prevents injuries on your feet. Wounds, scrapes, corns, and calluses  Check your feet daily for blisters, cuts, bruises, sores, and redness. If you cannot see the bottom of your feet, use a mirror or ask someone for help. Do not cut corns or calluses or try to remove them with medicine. If you find a minor scrape, cut, or break in the skin on your feet, keep it and the skin around it clean and dry. You may clean these areas with mild soap and water. Do not clean the area with peroxide, alcohol, or iodine. If you have a wound, scrape, corn, or callus on your foot, look at it several times a day to make sure it is healing and not infected. Check for: Redness, swelling, or pain. Fluid or blood. Warmth. Pus or a bad smell.  General tips Do not cross your legs. This may decrease blood flow to your feet. Do not use heating pads or hot water bottles on your feet. They may burn your skin. If you have lost feeling in your feet or legs, you may not know this is happening until it is too late. Protect your feet from hot and cold by wearing shoes, such as at the beach or on hot  pavement. Schedule a complete foot exam at least once a year (annually) or more often if you have foot problems. Report any cuts, sores, or bruises to your health care provider immediately. Where to find more information American Diabetes Association: www.diabetes.org Association of Diabetes Care & Education Specialists: www.diabeteseducator.org Contact a health care provider if: You have a medical condition that increases your risk of infection and you have any cuts, sores, or bruises on your feet. You have an injury that is not healing. You have redness on your legs or feet. You feel burning or tingling in your legs or feet. You have pain or cramps in your  legs and feet. Your legs or feet are numb. Your feet always feel cold. You have pain around any toenails. Get help right away if: You have a wound, scrape, corn, or callus on your foot and: You have pain, swelling, or redness that gets worse. You have fluid or blood coming from the wound, scrape, corn, or callus. Your wound, scrape, corn, or callus feels warm to the touch. You have pus or a bad smell coming from the wound, scrape, corn, or callus. You have a fever. You have a red line going up your leg. Summary Check your feet every day for blisters, cuts, bruises, sores, and redness. Apply a moisturizing lotion or petroleum jelly to the skin on your feet and to dry, brittle toenails. Wear shoes that fit properly and have enough cushioning. If you have foot problems, report any cuts, sores, or bruises to your health care provider immediately. Schedule a complete foot exam at least once a year (annually) or more often if you have foot problems. This information is not intended to replace advice given to you by your health care provider. Make sure you discuss any questions you have with your healthcare provider. Document Revised: 02/16/2020 Document Reviewed: 02/16/2020 Elsevier Patient Education  2022 [place]  Prevention in the Home, Adult Falls can cause injuries and can happen to people of all ages. There are many things you can do to make your home safe and to help prevent falls. Ask forhelp when making these changes. What actions can I take to prevent falls? General Instructions Use good lighting in all rooms. Replace any light bulbs that burn out. Turn on the lights in dark areas. Use night-lights. Keep items that you use often in easy-to-reach places. Lower the shelves around your home if needed. Set up your furniture so you have a clear path. Avoid moving your furniture around. Do not have throw rugs or other things on the floor that can make you trip. Avoid walking on wet floors. If any of your floors are uneven, fix them. Add color or contrast paint or tape to clearly mark and help you see: Grab bars or handrails. First and last steps of staircases. Where the edge of each step is. If you use a stepladder: Make sure that it is fully opened. Do not climb a closed stepladder. Make sure the sides of the stepladder are locked in place. Ask someone to hold the stepladder while you use it. Know where your pets are when moving through your home. What can I do in the bathroom?     Keep the floor dry. Clean up any water on the floor right away. Remove soap buildup in the tub or shower. Use nonskid mats or decals on the floor of the tub or shower. Attach bath mats securely with double-sided, nonslip rug tape. If you need to sit down in the shower, use a plastic, nonslip stool. Install grab bars by the toilet and in the tub and shower. Do not use towel bars as grab bars. What can I do in the bedroom? Make sure that you have a light by your bed that is easy to reach. Do not use any sheets or blankets for your bed that hang to the floor. Have a firm chair with side arms that you can use for support when you get dressed. What can I do in the kitchen? Clean up any spills right away. If you  need to reach something above you, use a step stool  with a grab bar. Keep electrical cords out of the way. Do not use floor polish or wax that makes floors slippery. What can I do with my stairs? Do not leave any items on the stairs. Make sure that you have a light switch at the top and the bottom of the stairs. Make sure that there are handrails on both sides of the stairs. Fix handrails that are broken or loose. Install nonslip stair treads on all your stairs. Avoid having throw rugs at the top or bottom of the stairs. Choose a carpet that does not hide the edge of the steps on the stairs. Check carpeting to make sure that it is firmly attached to the stairs. Fix carpet that is loose or worn. What can I do on the outside of my home? Use bright outdoor lighting. Fix the edges of walkways and driveways and fix any cracks. Remove anything that might make you trip as you walk through a door, such as a raised step or threshold. Trim any bushes or trees on paths to your home. Check to see if handrails are loose or broken and that both sides of all steps have handrails. Install guardrails along the edges of any raised decks and porches. Clear paths of anything that can make you trip, such as tools or rocks. Have leaves, snow, or ice cleared regularly. Use sand or salt on paths during winter. Clean up any spills in your garage right away. This includes grease or oil spills. What other actions can I take? Wear shoes that: Have a low heel. Do not wear high heels. Have rubber bottoms. Feel good on your feet and fit well. Are closed at the toe. Do not wear open-toe sandals. Use tools that help you move around if needed. These include: Canes. Walkers. Scooters. Crutches. Review your medicines with your doctor. Some medicines can make you feel dizzy. This can increase your chance of falling. Ask your doctor what else you can do to help prevent falls. Where to find more information Centers for  Disease Control and Prevention, [place]: http://www.wolf.info/ National Institute on Aging: http://kim-miller.com/ Contact a doctor if: You are afraid of falling at home. You feel weak, drowsy, or dizzy at home. You fall at home. Summary There are many simple things that you can do to make your home safe and to help prevent falls. Ways to make your home safe include removing things that can make you trip and installing grab bars in the bathroom. Ask for help when making these changes in your home. This information is not intended to replace advice given to you by your health care provider. Make sure you discuss any questions you have with your healthcare provider. Document Revised: 02/29/2020 Document Reviewed: 02/29/2020 Elsevier Patient Education  [place] Maintenance, Male Adopting a healthy lifestyle and getting preventive care are important in promoting health and wellness. Ask your health care provider about: The right schedule for you to have regular tests and exams. Things you can do on your own to prevent diseases and keep yourself healthy. What should I know about diet, weight, and exercise? Eat a healthy diet  Eat a diet that includes plenty of vegetables, fruits, low-fat dairy products, and lean protein. Do not eat a lot of foods that are high in solid fats, added sugars, or sodium.  Maintain a healthy weight Body mass index (BMI) is a measurement that can be used to identify possible weight problems. It estimates body fat based on height  and weight. Your health care provider can help determine your BMI and help you achieve or maintain ahealthy weight. Get regular exercise Get regular exercise. This is one of the most important things you can do for your health. Most adults should: Exercise for at least 150 minutes each week. The exercise should increase your heart rate and make you sweat (moderate-intensity exercise). Do strengthening exercises at least twice a week. This  is in addition to the moderate-intensity exercise. Spend less time sitting. Even light physical activity can be beneficial. Watch cholesterol and blood lipids Have your blood tested for lipids and cholesterol at 66 years of age, then havethis test every 5 years. You may need to have your cholesterol levels checked more often if: Your lipid or cholesterol levels are high. You are older than 66 years of age. You are at high risk for heart disease. What should I know about cancer screening? Many types of cancers can be detected early and may often be prevented. Depending on your health history and family history, you may need to have cancer screening at various ages. This may include screening for: Colorectal cancer. Prostate cancer. Skin cancer. Lung cancer. What should I know about heart disease, diabetes, and high blood pressure? Blood pressure and heart disease High blood pressure causes heart disease and increases the risk of stroke. This is more likely to develop in people who have high blood pressure readings, are of African descent, or are overweight. Talk with your health care provider about your target blood pressure readings. Have your blood pressure checked: Every 3-5 years if you are 43-51 years of age. Every year if you are 56 years old or older. If you are between the ages of 51 and 75 and are a current or former smoker, ask your health care provider if you should have a one-time screening for abdominal aortic aneurysm (AAA). Diabetes Have regular diabetes screenings. This checks your fasting blood sugar level. Have the screening done: Once every three years after age 18 if you are at a normal weight and have a low risk for diabetes. More often and at a younger age if you are overweight or have a high risk for diabetes. What should I know about preventing infection? Hepatitis B If you have a higher risk for hepatitis B, you should be screened for this virus. Talk with your  health care provider to find out if you are at risk forhepatitis B infection. Hepatitis C Blood testing is recommended for: Everyone born from [birth year] through 1966. Anyone with known risk factors for hepatitis C. Sexually transmitted infections (STIs) You should be screened each year for STIs, including gonorrhea and chlamydia, if: You are sexually active and are younger than 66 years of age. You are older than 66 years of age and your health care provider tells you that you are at risk for this type of infection. Your sexual activity has changed since you were last screened, and you are at increased risk for chlamydia or gonorrhea. Ask your health care provider if you are at risk. Ask your health care provider about whether you are at high risk for HIV. Your health care provider may recommend a prescription medicine to help prevent HIV infection. If you choose to take medicine to prevent HIV, you should first get tested for HIV. You should then be tested every 3 months for as long as you are taking the medicine. Follow these instructions at home: Lifestyle Do not use any products that contain nicotine  or tobacco, such as cigarettes, e-cigarettes, and chewing tobacco. If you need help quitting, ask your health care provider. Do not use street drugs. Do not share needles. Ask your health care provider for help if you need support or information about quitting drugs. Alcohol use Do not drink alcohol if your health care provider tells you not to drink. If you drink alcohol: Limit how much you have to 0-2 drinks a day. Be aware of how much alcohol is in your drink. In the [place] one drink equals one 12 oz bottle of beer (355 mL), one 5 oz glass of wine (148 mL), or one 1 oz glass of hard liquor (44 mL). General instructions Schedule regular health, dental, and eye exams. Stay current with your vaccines. Tell your health care provider if: You often feel depressed. You have ever been abused or do  not feel safe at home. Summary Adopting a healthy lifestyle and getting preventive care are important in promoting health and wellness. Follow your health care provider's instructions about healthy diet, exercising, and getting tested or screened for diseases. Follow your health care provider's instructions on monitoring your cholesterol and blood pressure. This information is not intended to replace advice given to you by your health care provider. Make sure you discuss any questions you have with your healthcare provider. Document Revised: 07/21/2018 Document Reviewed: 07/21/2018 Elsevier Patient Education  2022 [employer].

## 2021-02-19 NOTE — Progress Notes (Signed)
Subjective:   Marco [name] is a 65 y.o. male who presents for Medicare Annual/Subsequent preventive examination.  Review of Systems    Review of Systems  Constitutional: Negative.   HENT: Negative.    Eyes: Negative.   Respiratory: Negative.    Cardiovascular: Negative.   Gastrointestinal: Negative.   Genitourinary: Negative.   Musculoskeletal: Negative.   Skin: Negative.   Neurological: Negative.   Endo/Heme/Allergies: Negative.   Psychiatric/Behavioral: Negative.     Cardiac Risk Factors include: advanced age (>15men, >35 women);obesity (BMI >30kg/m2);sedentary lifestyle;male gender     Objective:    Today's Vitals   02/19/21 1403  BP: (!) 158/79  Pulse: 68  Resp: 16  Temp: 97.9 F (36.6 C)  SpO2: 97%  Weight: 217 lb (98.4 kg)  Height: 5\' 6"  (1.676 m)   Body mass index is 35.02 kg/m.  Advanced Directives 02/19/2021 01/03/2021 05/14/2020 01/14/2020 12/27/2019 12/26/2019 12/15/2019  Does Patient Have a Medical Advance Directive? No No No No No No No  Type of Advance Directive - - - - - - -  Would patient like information on creating a medical advance directive? No - Patient declined No - Patient declined No - Patient declined No - Patient declined No - Patient declined - -    Current Medications (verified) Outpatient Encounter Medications as of 02/19/2021  Medication Sig   Accu-Chek Softclix Lancets lancets USE AS INSTRUCTED   albuterol (VENTOLIN HFA) 108 (90 Base) MCG/ACT inhaler INHALE 2 PUFFS INTO THE LUNGS EVERY 6 HOURS AS NEEDED FOR WHEEZING OR SHORTNESS OF BREATH.   allopurinol (ZYLOPRIM) 100 MG tablet Take 2 tablets by mouth daily   aspirin (EQ ASPIRIN ADULT LOW DOSE) 81 MG EC tablet Take 1 tablet (81 mg total) by mouth daily. TAKE 1 TABLET BY MOUTH ONCE DAILY. SWALLOW WHOLE Please hold asa until you repeat blood work, resume if blood count stable and ok with you primary care doctor   atorvastatin (LIPITOR) 40 MG tablet Take 1 tablet (40 mg total) by mouth  daily.   carvedilol (COREG) 12.5 MG tablet TAKE 1 TABLET BY MOUTH TWO TIMES DAILY WITH MEALS   Continuous Blood Gluc Receiver (DEXCOM G6 RECEIVER) DEVI Use as instructed to monitor blood sugar daily   Continuous Blood Gluc Sensor (DEXCOM G6 SENSOR) MISC Use as instructed to monitor blood sugar daily. Change every 10 days.   Continuous Blood Gluc Transmit (DEXCOM G6 TRANSMITTER) MISC Use as instructed to monitor blood sugar daily. Change every 90 days.   COSENTYX SENSOREADY PEN 150 MG/ML SOAJ SMARTSIG:1 Pre-Filled Pen Syringe SUB-Q Every 4 Weeks   dextromethorphan-guaiFENesin (MUCINEX DM) 30-600 MG 12hr tablet Take 1 tablet by mouth 2 (two) times daily as needed for cough. (Patient not taking: Reported on 02/18/2021)   diclofenac Sodium (VOLTAREN) 1 % GEL Apply 4 g topically 4 (four) times daily. (Patient taking differently: Apply 4 g topically 4 (four) times daily as needed (pain).)   DULoxetine (CYMBALTA) 20 MG capsule TAKE 1 CAPSULE BY MOUTH ONCE A DAY   eszopiclone (LUNESTA) 1 MG TABS tablet TAKE 1 TABLET BY MOUTH EVERY NIGHT IMMEDIATELY BEFORE BEDTIME AS NEEDED FOR SLEEP   famotidine (PEPCID) 20 MG tablet TAKE 1 TABLET BY MOUTH 2 TIMES DAILY   fexofenadine (ALLEGRA) 180 MG tablet Take 180 mg by mouth daily.   fluticasone (FLONASE) 50 MCG/ACT nasal spray PLACE 1 SPRAY INTO BOTH NOSTRILS DAILY AS NEEDED FOR ALLERGIES OR RHINITIS. (Patient not taking: Reported on 02/18/2021)   furosemide (LASIX) 40 MG  tablet Take 1 tablet (40 mg total) by mouth every evening.   furosemide (LASIX) 80 MG tablet Take 1 tablet (80 mg total) by mouth every morning.   glucose blood test strip USE TO TEST 4 TIMES DAILY. (Patient taking differently: USE TO TEST 4 TIMES DAILY.)   Insulin Lispro Prot & Lispro (HUMALOG MIX 50/50 KWIKPEN) (50-50) 100 UNIT/ML Kwikpen Inject 75 Units into the skin daily with breakfast.   Insulin Pen Needle (UNIFINE PENTIPS) 32G X 4 MM MISC USE AS DIRECTED DAILY   mupirocin cream (BACTROBAN) 2 %  Apply 1 application topically 2 (two) times daily.   naloxone (NARCAN) nasal spray 4 mg/0.1 mL Place in nostril in the event of suspected overdose on Narcotic medication (Oxycodone)   oxyCODONE (OXY IR/ROXICODONE) 5 MG immediate release tablet Take 5 mg by mouth 3 (three) times daily as needed for moderate pain.    oxymetazoline (12 HOUR DECONGESTANT) 0.05 % nasal spray Place 1 spray into both nostrils 2 (two) times daily.   polyethylene glycol (MIRALAX / GLYCOLAX) 17 g packet Take 17 g by mouth daily as needed for moderate constipation or severe constipation.   potassium chloride SA (KLOR-CON) 20 MEQ tablet Take 1 and 1/2 tablets (30 mEq total) by mouth daily.   Secukinumab (COSENTYX SENSOREADY PEN) 150 MG/ML SOAJ INJECT ONE PEN SUBCUTANEOUSLY EVERY 4 WEEKS. REFRIGERATE. ALLOW 15 TO 30 MINUTES AT ROOM TEMP PRIOR TO ADMINISTRATION.   Sodium Chloride-Sodium Bicarb (KETTLE NETI POT SINUS WASH) 2300-700 MG KIT Flush the sinuses twice a week.  Asked pharmacist to show you how to use.   Tafamidis 61 MG CAPS TAKE 1 CAPSULE (61 MG) BY MOUTH DAILY.   triamcinolone cream (KENALOG) 0.1 % APPLY TO THE AFFECTED AREA(S) TOPICALLY 2 TIMES DAILY AS DIRECTED   UNIFINE PENTIPS 32G X 4 MM MISC USE AS DIRECTED DAILY   [DISCONTINUED] hydrALAZINE (APRESOLINE) 25 MG tablet Take 1 tablet (25 mg total) by mouth 3 (three) times daily.   [DISCONTINUED] isosorbide mononitrate (IMDUR) 30 MG 24 hr tablet Take 1 tablet (30 mg total) by mouth daily.   [DISCONTINUED] ramelteon (ROZEREM) 8 MG tablet TAKE 1 TABLET BY MOUTH AT BEDTIME   No facility-administered encounter medications on file as of 02/19/2021.    Allergies (verified) Nsaids and Jardiance [empagliflozin]   History: Past Medical History:  Diagnosis Date   Benign colon polyp 08/01/2013   [facility] in [place]. large base tranverse colon polyp was biopsied. polyp was benign with minimal surface hyperplastic change.   Chronic combined systolic and  diastolic CHF (congestive heart failure) (HCC)    Chronic pain    CKD (chronic kidney disease), stage III (HCC)    Diabetes mellitus without complication ([place])    Diverticulosis    Esophageal hiatal hernia 07/29/2013   confirmed on EGD    Esophageal stricture    Essential hypertension    Gastritis 07/29/2013   confirmed on EGD, bx done an negative for intestinal metaplasia, dsyplasia or H. pylori. normal gastric emptying study done 07/13/2013.   GERD (gastroesophageal reflux disease)    Morbid obesity ([place])    Non-obstructive CAD    a. 02/2013 Cath ([place]): nonobs dzs;  b. 09/2014 Myoview ([place]): EF 55%, no ischemia;  c. Cath 05/2018 mild CAD no obstruction.   Persistent atrial fibrillation ([place])    a. 02/2013 s/p rfca in [place], [place]-->prev on Xarelto, d/c'd 2/2 anemia, ? GIB.   Rheumatoid arthritis ([place])    Sigmoid diverticulosis 08/01/2013   confirmed  on colonscopy. record scanned into chart   Past Surgical History:  Procedure Laterality Date   CARDIAC CATHETERIZATION  05/2014   ablation for atrial fibrillation   CARDIAC CATHETERIZATION N/A 11/16/2015   Procedure: Left Heart Cath and Coronary Angiography;  Surgeon: Leonie Man, MD;  Location: [facility];  Service: Cardiovascular;  Laterality: N/A;   COLON RESECTION  09/2013   due to large, abnormal polpy. non cancerous per patient.    COLON SURGERY  09/2014   colon resection    EXTRACORPOREAL SHOCK WAVE LITHOTRIPSY Left 01/03/2021   Procedure: EXTRACORPOREAL SHOCK WAVE LITHOTRIPSY (ESWL);  Surgeon: Robley Fries, MD;  Location: [facility];  Service: Urology;  Laterality: Left;   IR PERC PLEURAL DRAIN W/INDWELL CATH W/IMG GUIDE  01/07/2020   LEFT HEART CATH AND CORONARY ANGIOGRAPHY N/A 05/19/2018   Procedure: LEFT HEART CATH AND CORONARY ANGIOGRAPHY;  Surgeon: Wellington Hampshire, MD;  Location: [facility];  Service: Cardiovascular;  Laterality: N/A;   Family History  Problem Relation Age of Onset    Hypertension Mother    Diabetes Mother    COPD Mother    Lung cancer Mother        Smoker    Hypertension Father    Diabetes Father    Heart Problems Father    COPD Father    Colon cancer Neg Hx    Stomach cancer Neg Hx    Social History   Socioeconomic History   Marital status: Married    Spouse name: Amethyst   Number of children: 8   Years of education: 13   Highest education level: Some college, no degree  Occupational History    Comment: disabled  Tobacco Use   Smoking status: Former    Packs/day: 0.50    Years: 10.00    Pack years: 5.00    Types: Cigarettes    Quit date: 04/11/2008    Years since quitting: 12.8   Smokeless tobacco: Never  Vaping Use   Vaping Use: Never used  Substance and Sexual Activity   Alcohol use: No    Alcohol/week: 0.0 standard drinks   Drug use: No   Sexual activity: Yes    Partners: Female    Birth control/protection: None  Other Topics Concern   Not on file  Social History Narrative   Lives with wife. Does not work.  On disability.    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 Provided crisis assistance program information to assist with bills       Has issues getting food- gets $36/month in food stamps- already has list of food pantries but has not tried them- encouraged pt to try food pantries and reach out to clinic for help if needed   Social Determinants of Health   Financial Resource Strain: Low Risk    Difficulty of Paying Living Expenses: Not hard at all  Food Insecurity: No Food Insecurity   Worried About Charity fundraiser in the Last Year: Never true   [place] in the Last Year: Never true  Transportation Needs: No Transportation Needs   Lack of Transportation (Medical): No   Lack of Transportation (Non-Medical): No  Physical  Activity: Inactive   Days of Exercise per Week: 0 days  Minutes of Exercise per Session: 0 min  Stress: No Stress Concern Present   Feeling of Stress : Not at all  Social Connections: Moderately Isolated   Frequency of Communication with Friends and Family: Once a week   Frequency of Social Gatherings with Friends and Family: Never   Attends Religious Services: More than 4 times per year   Active Member of [employer] or Organizations: Yes   Attends Music therapist: More than 4 times per year   Marital Status: Separated    Tobacco Counseling Counseling given: Not Answered   Clinical Intake:  Pre-visit preparation completed: No  Pain : No/denies pain     BMI - recorded: 35.02 Nutritional Status: BMI > 30  Obese Nutritional Risks: None Diabetes: Yes CBG done?: No Did pt. bring in CBG monitor from home?: No  How often do you need to have someone help you when you read instructions, pamphlets, or other written materials from your doctor or pharmacy?: 1 - Never What is the last grade level you completed in school?: 12th  Diabetic?YES    Interpreter Needed?: No      Activities of Daily Living In your present state of health, do you have any difficulty performing the following activities: 02/19/2021 01/03/2021  Hearing? N N  Vision? N N  Difficulty concentrating or making decisions? Y N  Walking or climbing stairs? Y Y  Dressing or bathing? N N  Doing errands, shopping? N -  Preparing Food and eating ? N -  Using the Toilet? N -  In the past six months, have you accidently leaked urine? N -  Do you have problems with loss of bowel control? N -  Managing your Medications? N -  Managing your Finances? N -  Housekeeping or managing your Housekeeping? N -  Some recent data might be hidden    Patient Care Team: [name], MD as PCP - General (Internal Medicine) Sueanne Margarita, MD as PCP - Cardiology (Cardiology) Bensimhon, Shaune Pascal, MD as PCP -  Advanced Heart Failure (Cardiology)  Indicate any recent Medical Services you may have received from other than [facility] providers in the past year (date may be approximate).     Assessment:   This is a routine wellness examination for Nig[place].  Hearing/Vision screen No results found.  Dietary issues and exercise activities discussed: Current Exercise Habits: The patient does not participate in regular exercise at present, Exercise limited by: None identified   Goals Addressed             This Visit's Progress    DIET - REDUCE FAT INTAKE         Depression Screen PHQ 2/9 Scores 02/19/2021 11/28/2020 10/17/2020 06/11/2020 02/16/2020 05/02/2019 10/20/2018  PHQ - 2 Score 0 0 0 1 0 0 0  PHQ- 9 Score - - 5 - - 0 0    Fall Risk Fall Risk  02/19/2021 12/17/2020 11/28/2020 10/17/2020 06/11/2020  Falls in the past year? 0 0 0 0 1  Number falls in past yr: 0 0 - 0 1  Injury with Fall? 0 0 - 0 1  Risk for fall due to : No Fall Risks - - - -  Follow up Education provided - - - -    FALL RISK PREVENTION PERTAINING TO THE HOME:  Any stairs in or around the home? No  If so, are there any without handrails?  na Home free of loose throw rugs in walkways, pet beds, electrical  cords, etc? Yes  Adequate lighting in your home to reduce risk of falls? Yes   ASSISTIVE DEVICES UTILIZED TO PREVENT FALLS:  Life alert? No  Use of a cane, walker or w/c? No  Grab bars in the bathroom? No  Shower chair or bench in shower? No  Elevated toilet seat or a handicapped toilet? No   TIMED UP AND GO:  Was the test performed? Yes .  Length of time to ambulate 10 feet: 3 sec.   Gait steady and fast without use of assistive device  Cognitive Function: MMSE - Mini Mental State Exam 02/19/2021 09/10/2016  Orientation to time 5 5  Orientation to Place 5 5  Registration 3 3  Attention/ Calculation 5 5  Recall 3 2  Language- name 2 objects 2 2  Language- repeat 1 0  Language- follow 3 step command 3 3  Language-  read & follow direction 1 1  Write a sentence 1 1  Copy design 1 0  Total score 30 27        Immunizations Immunization History  Administered Date(s) Administered   PFIZER(Purple Top)SARS-COV-2 Vaccination 11/03/2019, 11/24/2019   Zoster Recombinat (Shingrix) 09/14/2020    TDAP status: Due, Education has been provided regarding the importance of this vaccine. Advised may receive this vaccine at local pharmacy or Health Dept. Aware to provide a copy of the vaccination record if obtained from local pharmacy or Health Dept. Verbalized acceptance and understanding.  Flu Vaccine status: Declined, Education has been provided regarding the importance of this vaccine but patient still declined. Advised may receive this vaccine at local pharmacy or Health Dept. Aware to provide a copy of the vaccination record if obtained from local pharmacy or Health Dept. Verbalized acceptance and understanding.  Pneumococcal vaccine status: Up to date  Covid-19 vaccine status: Completed vaccines  Qualifies for Shingles Vaccine? Yes   Zostavax completed Yes   Shingrix Completed?: Yes  Screening Tests Health Maintenance  Topic Date Due   URINE MICROALBUMIN  05/15/2016   COVID-19 Vaccine (3 - Pfizer risk series) 12/22/2019   COLONOSCOPY (Pts 45-55yrs Insurance coverage will need to be confirmed)  04/18/2020   Zoster Vaccines- Shingrix (2 of 2) 11/09/2020   PNA vac Low Risk Adult (1 of 2 - PCV13) 10/17/2021 (Originally 01/22/2020)   INFLUENZA VACCINE  03/11/2021   HEMOGLOBIN A1C  06/30/2021   FOOT EXAM  12/28/2021   OPHTHALMOLOGY EXAM  02/08/2022   Hepatitis C Screening  Completed   HPV VACCINES  Aged Out   TETANUS/TDAP  Discontinued    Health Maintenance  Health Maintenance Due  Topic Date Due   URINE MICROALBUMIN  05/15/2016   COVID-19 Vaccine (3 - Pfizer risk series) 12/22/2019   COLONOSCOPY (Pts 45-62yrs Insurance coverage will need to be confirmed)  04/18/2020   Zoster Vaccines- Shingrix  (2 of 2) 11/09/2020    Colorectal cancer screening: Type of screening: Cologuard - to be completed by PCP  Lung Cancer Screening: (Low Dose CT Chest recommended if Age 84-80 years, 30 pack-year currently smoking OR have quit w/in 15years.) does qualify.   Lung Cancer Screening Referral: refused  Additional Screening:  Hepatitis C Screening: does qualify; Completed   Vision Screening: Recommended annual ophthalmology exams for early detection of glaucoma and other disorders of the eye. Is the patient up to date with their annual eye exam?  Yes  Who is the provider or what is the name of the office in which the patient attends annual eye exams? [facility] If pt is not established with a provider, would they like to be referred to a provider to establish care? No .   Dental Screening: Recommended annual dental exams for proper oral hygiene  Community Resource Referral / Chronic Care Management: CRR required this visit?  No   CCM required this visit?  No      Plan:     I have personally reviewed and noted the following in the patient's chart:   Medical and social history Use of alcohol, tobacco or illicit drugs  Current medications and supplements including opioid prescriptions. Patient is currently taking opioid prescriptions. Information provided to patient regarding non-opioid alternatives. Patient advised to discuss non-opioid treatment plan with their provider. Functional ability and status Nutritional status Physical activity Advanced directives List of other physicians Hospitalizations, surgeries, and ER visits in previous 12 months Vitals Screenings to include cognitive, depression, and falls Referrals and appointments  In addition, I have reviewed and discussed with patient certain preventive protocols, quality metrics, and best practice recommendations. A written personalized care plan for preventive services as well as general preventive health recommendations  were provided to patient.     Fenton Foy, NP   02/19/2021

## 2021-02-20 ENCOUNTER — Telehealth: Payer: Self-pay

## 2021-02-20 ENCOUNTER — Other Ambulatory Visit (HOSPITAL_COMMUNITY): Payer: Self-pay

## 2021-02-20 DIAGNOSIS — Z794 Long term (current) use of insulin: Secondary | ICD-10-CM | POA: Diagnosis not present

## 2021-02-20 DIAGNOSIS — E119 Type 2 diabetes mellitus without complications: Secondary | ICD-10-CM | POA: Diagnosis not present

## 2021-02-20 NOTE — Telephone Encounter (Signed)
Papers has been faxed to [facility] for [facility] along with chart notes

## 2021-03-04 ENCOUNTER — Other Ambulatory Visit (HOSPITAL_COMMUNITY): Payer: Self-pay

## 2021-03-05 ENCOUNTER — Ambulatory Visit: Payer: Medicare Other | Admitting: Endocrinology

## 2021-03-06 ENCOUNTER — Other Ambulatory Visit (HOSPITAL_COMMUNITY): Payer: Self-pay

## 2021-03-06 ENCOUNTER — Other Ambulatory Visit: Payer: Self-pay | Admitting: Internal Medicine

## 2021-03-06 MED ORDER — DULOXETINE HCL 20 MG PO CPEP
ORAL_CAPSULE | Freq: Every day | ORAL | 0 refills | Status: DC
  Filled 2021-03-06 – 2021-04-05 (×2): qty 90, 90d supply, fill #0

## 2021-03-07 ENCOUNTER — Other Ambulatory Visit (HOSPITAL_COMMUNITY): Payer: Self-pay

## 2021-03-07 ENCOUNTER — Telehealth: Payer: Self-pay | Admitting: Endocrinology

## 2021-03-07 NOTE — Telephone Encounter (Signed)
Please Advise

## 2021-03-07 NOTE — Telephone Encounter (Signed)
Pt calling in voiced that he has been having trouble with high sugar has been doing everything that was told to do medication wise. Sugar spikes in the morning 03/04/2021 was 373. 7/26 was 390. Yesterday 7/27  morning  360. This morning 03/07/2021 428.[name] Pt is neeing to know what to do regarding his sugar. Would like a call back.[name]

## 2021-03-08 NOTE — Telephone Encounter (Signed)
Message sent thru MyChart 

## 2021-03-11 ENCOUNTER — Other Ambulatory Visit: Payer: Self-pay

## 2021-03-11 ENCOUNTER — Encounter: Payer: Medicare Other | Attending: Endocrinology | Admitting: Nutrition

## 2021-03-11 DIAGNOSIS — E1142 Type 2 diabetes mellitus with diabetic polyneuropathy: Secondary | ICD-10-CM | POA: Insufficient documentation

## 2021-03-11 NOTE — Patient Instructions (Signed)
Change sensor every 10 days Change transmitter every 3 months.

## 2021-03-11 NOTE — Progress Notes (Signed)
Patient reports that he needs help with starting back on his Dexcom.  He wanted the readings to go to the hand held receiver, but I explained that he would need to download the readings each time he comes in for the doctor to see the readings.  He agreed to have them go to his phone.   He was retrained on how to use the dexcom, putting in the new transmitter number and sensor number.  He inserted the transmitter into his left upper abdomen and attached the transmitter.  This was linked to his phone, and then to this practice.  He was encourage to read over the booklet and to call the 800 number if questions. He had no final questions.

## 2021-03-12 ENCOUNTER — Other Ambulatory Visit (HOSPITAL_COMMUNITY): Payer: Self-pay

## 2021-03-12 MED FILL — Tafamidis Cap 61 MG: ORAL | 30 days supply | Qty: 30 | Fill #3 | Status: AC

## 2021-03-13 ENCOUNTER — Other Ambulatory Visit (HOSPITAL_COMMUNITY): Payer: Self-pay

## 2021-03-15 ENCOUNTER — Other Ambulatory Visit (HOSPITAL_COMMUNITY): Payer: Self-pay

## 2021-03-15 DIAGNOSIS — J9601 Acute respiratory failure with hypoxia: Secondary | ICD-10-CM | POA: Diagnosis not present

## 2021-03-15 DIAGNOSIS — L309 Dermatitis, unspecified: Secondary | ICD-10-CM | POA: Diagnosis not present

## 2021-03-15 MED ORDER — CLOBETASOL PROPIONATE 0.05 % EX CREA
TOPICAL_CREAM | CUTANEOUS | 0 refills | Status: DC
  Filled 2021-03-15: qty 30, 15d supply, fill #0

## 2021-03-18 DIAGNOSIS — M25511 Pain in right shoulder: Secondary | ICD-10-CM | POA: Diagnosis not present

## 2021-03-18 DIAGNOSIS — M545 Low back pain, unspecified: Secondary | ICD-10-CM | POA: Diagnosis not present

## 2021-03-18 DIAGNOSIS — G894 Chronic pain syndrome: Secondary | ICD-10-CM | POA: Diagnosis not present

## 2021-03-18 DIAGNOSIS — M5412 Radiculopathy, cervical region: Secondary | ICD-10-CM | POA: Diagnosis not present

## 2021-03-18 DIAGNOSIS — M79662 Pain in left lower leg: Secondary | ICD-10-CM | POA: Diagnosis not present

## 2021-03-18 DIAGNOSIS — E1143 Type 2 diabetes mellitus with diabetic autonomic (poly)neuropathy: Secondary | ICD-10-CM | POA: Diagnosis not present

## 2021-03-18 DIAGNOSIS — G890 Central pain syndrome: Secondary | ICD-10-CM | POA: Diagnosis not present

## 2021-03-18 DIAGNOSIS — M79661 Pain in right lower leg: Secondary | ICD-10-CM | POA: Diagnosis not present

## 2021-03-18 DIAGNOSIS — M542 Cervicalgia: Secondary | ICD-10-CM | POA: Diagnosis not present

## 2021-03-18 DIAGNOSIS — Z79891 Long term (current) use of opiate analgesic: Secondary | ICD-10-CM | POA: Diagnosis not present

## 2021-03-18 DIAGNOSIS — M25512 Pain in left shoulder: Secondary | ICD-10-CM | POA: Diagnosis not present

## 2021-03-21 ENCOUNTER — Telehealth: Payer: Self-pay | Admitting: Internal Medicine

## 2021-03-21 NOTE — Telephone Encounter (Signed)
Copied from [place] [id number]. Topic: General - Other >> Mar 19, 2021  2:09 PM Tessa Lerner A wrote: Reason for CRM: Patient has called to share that they need a CMN letter for their oxygen tank  The patient needs the letter submitted to [facility] via fax [phone]   Please contact further if needed

## 2021-03-21 NOTE — Telephone Encounter (Signed)
Pt needing letter for Oxygen

## 2021-03-25 DIAGNOSIS — G4700 Insomnia, unspecified: Secondary | ICD-10-CM | POA: Diagnosis not present

## 2021-03-25 DIAGNOSIS — G4733 Obstructive sleep apnea (adult) (pediatric): Secondary | ICD-10-CM | POA: Diagnosis not present

## 2021-03-29 DIAGNOSIS — L309 Dermatitis, unspecified: Secondary | ICD-10-CM | POA: Diagnosis not present

## 2021-04-05 ENCOUNTER — Ambulatory Visit: Payer: Medicare Other | Attending: Internal Medicine | Admitting: Internal Medicine

## 2021-04-05 ENCOUNTER — Other Ambulatory Visit: Payer: Self-pay | Admitting: Endocrinology

## 2021-04-05 ENCOUNTER — Other Ambulatory Visit: Payer: Self-pay

## 2021-04-05 ENCOUNTER — Encounter: Payer: Self-pay | Admitting: Internal Medicine

## 2021-04-05 ENCOUNTER — Other Ambulatory Visit (HOSPITAL_COMMUNITY): Payer: Self-pay

## 2021-04-05 VITALS — BP 142/86 | HR 70 | Resp 16 | Wt 221.6 lb

## 2021-04-05 DIAGNOSIS — E1159 Type 2 diabetes mellitus with other circulatory complications: Secondary | ICD-10-CM

## 2021-04-05 DIAGNOSIS — Z9981 Dependence on supplemental oxygen: Secondary | ICD-10-CM | POA: Diagnosis not present

## 2021-04-05 DIAGNOSIS — I10 Essential (primary) hypertension: Secondary | ICD-10-CM

## 2021-04-05 DIAGNOSIS — L309 Dermatitis, unspecified: Secondary | ICD-10-CM | POA: Diagnosis not present

## 2021-04-05 MED ORDER — FLUOCINONIDE 0.05 % EX CREA
TOPICAL_CREAM | CUTANEOUS | 0 refills | Status: DC
  Filled 2021-04-05: qty 60, 30d supply, fill #0

## 2021-04-05 MED FILL — Tafamidis Cap 61 MG: ORAL | 30 days supply | Qty: 30 | Fill #4 | Status: AC

## 2021-04-05 NOTE — Patient Instructions (Signed)
I will send the results of the oxygen saturations tests that we did today to [employer].

## 2021-04-05 NOTE — Progress Notes (Signed)
Patient ID: Marco [name], male    DOB: 03-06-1956  MRN: [id number]  CC: oxygen    Subjective: [name] is a 65 y.o. male who presents for chronic ds management His concerns today include:  Pt with history of perinephric abscess (12/2019 s/p drainage - E.coli), HTN, DM 2 with neuropathy, and nephropathy, CKD stage III, obesity, nonobstructive CAD/cardiac amyloid, chronic combined CHF EF 55-60% in 12/2019 and 25-30% 05/2020, PAF s/p ablation 2017 (Xarelto d/c due to recurrent anemia), OSA on VPAP, on Cosentyx and followed by rheumatology in [facility] for psoriasis/gout/nonspec arthritis, chronic insomnia. On narcotics through [facility] for chronic pain in chest/back/shoulders/neck.    Pt here to be reassess for continued home O2 needs O2 prescribed 01/2020 post hosp for sepsis and acute respiratory failure with hypoxia. Reports he still uses O2 when he feels he needs it like when he goes out and comes back in or after taking a shower if he feels winded.  He is on VPAP but does not use oxygen with that.  Blood pressure noted to be elevated today.  He is on carvedilol and furosemide.  He has taken his medicines already for the morning.  He does have home blood pressure monitoring device but does not check blood pressure.  Patient Active Problem List   Diagnosis Date Noted   Cellulitis of finger of left hand 10/20/2020   Nasal congestion 10/20/2020   History of Clostridium difficile infection 10/20/2020   Cough 10/14/2020   Healthcare maintenance 06/15/2020   Perinephric abscess 05/13/2020   Pyohydronephrosis 05/13/2020   Vitamin B12 deficiency 02/19/2020   Elevated LFTs    Pleural effusion    Weakness    Loculated pleural effusion    Complex renal cyst    Urethral stricture 12/30/2019   Nephrolithiasis 12/30/2019   Acute febrile illness 12/30/2019   Sepsis ([place]) 12/27/2019   Severe sepsis ([place]) 00/76/2263   Acute metabolic encephalopathy 33/54/5625   Sepsis due to Gram  negative bacteria ([place]) 12/09/2019   Rhinitis 10/16/2019   Gastroesophageal reflux disease without esophagitis 08/31/2018   Muscle spasm 08/31/2018   NICM (nonischemic cardiomyopathy) ([place]) 07/06/2018   Hypokalemia    Acute renal failure superimposed on stage 3 chronic kidney disease ([place]) 08/27/2017   Hyperlipidemia 12/23/2016   Chronic combined systolic and diastolic CHF (congestive heart failure) ([place]) 09/09/2016   Acute respiratory failure with hypoxia ([place]) 09/02/2016   Diabetic polyneuropathy associated with type 2 diabetes mellitus ([place]) 06/10/2016   Vision changes 06/02/2016   Myalgia and myositis 03/31/2016   Shortness of breath 02/09/2016   Chronic gouty arthritis 11/29/2015   LBP (low back pain) 11/27/2015   Arthralgia of multiple joints 11/27/2015   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[place]) 11/27/2015   Breast pain in male 11/14/2015   Sinusitis, chronic 10/16/2015   Chronic insomnia 10/16/2015   New onset of headaches after age 68 09/20/2015   OSA (obstructive sleep apnea) 07/12/2015   Low serum testosterone level 05/18/2015   Depression 05/16/2015   Morbid obesity due to excess calories (H[facility])    Chronic pain    Type 2 diabetes mellitus with stage 3 chronic kidney disease, without long-term current use of insulin ([place])    Rheumatoid arthritis ([place]) 02/05/2015   Elevated troponin 12/24/2014   CAD (coronary artery disease) 12/24/2014   Atrial fibrillation ([place]) 12/24/2014   Essential hypertension 12/24/2014   Chronic kidney disease 12/24/2014   PUD (peptic ulcer disease) 12/24/2014     Current Outpatient Medications on File Prior  to Visit  Medication Sig Dispense Refill   Accu-Chek Softclix Lancets lancets USE AS INSTRUCTED 100 each 12   albuterol (VENTOLIN HFA) 108 (90 Base) MCG/ACT inhaler INHALE 2 PUFFS INTO THE LUNGS EVERY 6 HOURS AS NEEDED FOR WHEEZING OR SHORTNESS OF BREATH. 8.5 g 1   allopurinol (ZYLOPRIM) 100 MG tablet Take 2 tablets by mouth daily 60 tablet 5    aspirin (EQ ASPIRIN ADULT LOW DOSE) 81 MG EC tablet Take 1 tablet (81 mg total) by mouth daily. TAKE 1 TABLET BY MOUTH ONCE DAILY. SWALLOW WHOLE Please hold asa until you repeat blood work, resume if blood count stable and ok with you primary care doctor     atorvastatin (LIPITOR) 40 MG tablet Take 1 tablet (40 mg total) by mouth daily. 90 tablet 3   carvedilol (COREG) 12.5 MG tablet TAKE 1 TABLET BY MOUTH TWO TIMES DAILY WITH MEALS 180 tablet 1   clobetasol cream (TEMOVATE) 0.05 % Apply to affected area twice a day 30 g 0   Continuous Blood Gluc Receiver (DEXCOM G6 RECEIVER) DEVI Use as instructed to monitor blood sugar daily 1 each 0   Continuous Blood Gluc Sensor (DEXCOM G6 SENSOR) MISC Use as instructed to monitor blood sugar daily. Change every 10 days. 9 each 3   Continuous Blood Gluc Transmit (DEXCOM G6 TRANSMITTER) MISC Use as instructed to monitor blood sugar daily. Change every 90 days. 1 each 3   COSENTYX SENSOREADY PEN 150 MG/ML SOAJ SMARTSIG:1 Pre-Filled Pen Syringe SUB-Q Every 4 Weeks     dextromethorphan-guaiFENesin (MUCINEX DM) 30-600 MG 12hr tablet Take 1 tablet by mouth 2 (two) times daily as needed for cough. (Patient not taking: Reported on 02/18/2021) 30 tablet 1   diclofenac Sodium (VOLTAREN) 1 % GEL Apply 4 g topically 4 (four) times daily. (Patient taking differently: Apply 4 g topically 4 (four) times daily as needed (pain).) 100 g 0   DULoxetine (CYMBALTA) 20 MG capsule TAKE 1 CAPSULE BY MOUTH ONCE A DAY 90 capsule 0   eszopiclone (LUNESTA) 1 MG TABS tablet TAKE 1 TABLET BY MOUTH EVERY NIGHT IMMEDIATELY BEFORE BEDTIME AS NEEDED FOR SLEEP 30 tablet 1   famotidine (PEPCID) 20 MG tablet TAKE 1 TABLET BY MOUTH 2 TIMES DAILY 60 tablet 2   fexofenadine (ALLEGRA) 180 MG tablet Take 180 mg by mouth daily.     fluticasone (FLONASE) 50 MCG/ACT nasal spray PLACE 1 SPRAY INTO BOTH NOSTRILS DAILY AS NEEDED FOR ALLERGIES OR RHINITIS. (Patient not taking: Reported on 02/18/2021) 16 g 1    furosemide (LASIX) 40 MG tablet Take 1 tablet (40 mg total) by mouth every evening. 90 tablet 3   furosemide (LASIX) 80 MG tablet Take 1 tablet (80 mg total) by mouth every morning. 90 tablet 3   glucose blood test strip USE TO TEST 4 TIMES DAILY. (Patient taking differently: USE TO TEST 4 TIMES DAILY.) 400 strip 3   Insulin Lispro Prot & Lispro (HUMALOG MIX 50/50 KWIKPEN) (50-50) 100 UNIT/ML Kwikpen Inject 75 Units into the skin daily with breakfast. 75 mL 3   Insulin Pen Needle (UNIFINE PENTIPS) 32G X 4 MM MISC USE AS DIRECTED DAILY 100 each 0   mupirocin cream (BACTROBAN) 2 % Apply 1 application topically 2 (two) times daily. 15 g 0   naloxone (NARCAN) nasal spray 4 mg/0.1 mL Place in nostril in the event of suspected overdose on Narcotic medication (Oxycodone) 1 each 0   oxyCODONE (OXY IR/ROXICODONE) 5 MG immediate release tablet Take 5 mg  by mouth 3 (three) times daily as needed for moderate pain.      oxymetazoline (12 HOUR DECONGESTANT) 0.05 % nasal spray Place 1 spray into both nostrils 2 (two) times daily. 30 mL 0   polyethylene glycol (MIRALAX / GLYCOLAX) 17 g packet Take 17 g by mouth daily as needed for moderate constipation or severe constipation. 14 each 0   potassium chloride SA (KLOR-CON) 20 MEQ tablet Take 1 and 1/2 tablets (30 mEq total) by mouth daily. 90 tablet 3   Secukinumab (COSENTYX SENSOREADY PEN) 150 MG/ML SOAJ INJECT ONE PEN SUBCUTANEOUSLY EVERY 4 WEEKS. REFRIGERATE. ALLOW 15 TO 30 MINUTES AT ROOM TEMP PRIOR TO ADMINISTRATION. 3 mL 1   Sodium Chloride-Sodium Bicarb (KETTLE NETI POT SINUS WASH) 2300-700 MG KIT Flush the sinuses twice a week.  Asked pharmacist to show you how to use. 1 kit 0   Tafamidis 61 MG CAPS TAKE 1 CAPSULE (61 MG) BY MOUTH DAILY. 30 capsule 11   triamcinolone cream (KENALOG) 0.1 % APPLY TO THE AFFECTED AREA(S) TOPICALLY 2 TIMES DAILY AS DIRECTED 454 g 0   UNIFINE PENTIPS 32G X 4 MM MISC USE AS DIRECTED DAILY 100 each 0   [DISCONTINUED] hydrALAZINE  (APRESOLINE) 25 MG tablet Take 1 tablet (25 mg total) by mouth 3 (three) times daily. 90 tablet 3   [DISCONTINUED] isosorbide mononitrate (IMDUR) 30 MG 24 hr tablet Take 1 tablet (30 mg total) by mouth daily. 30 tablet 3   [DISCONTINUED] ramelteon (ROZEREM) 8 MG tablet TAKE 1 TABLET BY MOUTH AT BEDTIME 30 tablet 1   No current facility-administered medications on file prior to visit.    Allergies  Allergen Reactions   Nsaids Other (See Comments)    Stomach pains. Ulcers - stated by patient    Jardiance [Empagliflozin] Nausea And Vomiting    Social History   Socioeconomic History   Marital status: Married    Spouse name: Amethyst   Number of children: 8   Years of education: 13   Highest education level: Some college, no degree  Occupational History    Comment: disabled  Tobacco Use   Smoking status: Former    Packs/day: 0.50    Years: 10.00    Pack years: 5.00    Types: Cigarettes    Quit date: 04/11/2008    Years since quitting: 12.9   Smokeless tobacco: Never  Vaping Use   Vaping Use: Never used  Substance and Sexual Activity   Alcohol use: No    Alcohol/week: 0.0 standard drinks   Drug use: No   Sexual activity: Yes    Partners: Female    Birth control/protection: None  Other Topics Concern   Not on file  Social History Narrative   Lives with wife. Does not work.  On disability.    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 Provided crisis assistance program information to assist with bills       Has issues getting food- gets $36/month in food stamps- already has list of food pantries but has not tried them- encouraged pt to try food pantries and reach out to clinic for help if needed   Social Determinants of Health   Financial Resource Strain: Low Risk    Difficulty of Paying Living  Expenses: Not hard at all  Food Insecurity: No Food Insecurity   Worried About Charity fundraiser in the Last Year: Never true   Ran Out of Food in the Last Year: Never true  Transportation Needs: No Transportation Needs   Lack of Transportation (Medical): No   Lack of Transportation (Non-Medical): No  Physical Activity: Inactive   Days of Exercise per Week: 0 days   Minutes of Exercise per Session: 0 min  Stress: No Stress Concern Present   Feeling of Stress : Not at all  Social Connections: Moderately Isolated   Frequency of Communication with Friends and Family: Once a week   Frequency of Social Gatherings with Friends and Family: Never   Attends Religious Services: More than 4 times per year   Active Member of Clubs or Organizations: Yes   Attends Music therapist: More than 4 times per year   Marital Status: Separated  Intimate Partner Violence: Not At Risk   Fear of Current or Ex-Partner: No   Emotionally Abused: No   Physically Abused: No   Sexually Abused: No    Family History  Problem Relation Age of Onset   Hypertension Mother    Diabetes Mother    COPD Mother    Lung cancer Mother        Smoker    Hypertension Father    Diabetes Father    Heart Problems Father    COPD Father    Colon cancer Neg Hx    Stomach cancer Neg Hx     Past Surgical History:  Procedure Laterality Date   CARDIAC CATHETERIZATION  05/2014   ablation for atrial fibrillation   CARDIAC CATHETERIZATION N/A 11/16/2015   Procedure: Left Heart Cath and Coronary Angiography;  Surgeon: Leonie Man, MD;  Location: [facility];  Service: Cardiovascular;  Laterality: N/A;   COLON RESECTION  09/2013   due to large, abnormal polpy. non cancerous per patient.    COLON SURGERY  09/2014   colon resection    EXTRACORPOREAL SHOCK WAVE LITHOTRIPSY Left 01/03/2021   Procedure: EXTRACORPOREAL SHOCK WAVE LITHOTRIPSY (ESWL);  Surgeon: Robley Fries, MD;  Location: [facility];  Service: Urology;  Laterality: Left;   IR PERC PLEURAL DRAIN W/INDWELL CATH W/IMG GUIDE  01/07/2020   LEFT HEART CATH AND CORONARY ANGIOGRAPHY N/A 05/19/2018   Procedure: LEFT HEART CATH AND CORONARY ANGIOGRAPHY;  Surgeon: Wellington Hampshire, MD;  Location: [facility];  Service: Cardiovascular;  Laterality: N/A;    ROS: Review of Systems Negative except as stated above  PHYSICAL EXAM: BP (!) 142/86   Pulse 70   Resp 16   Wt 221 lb 9.6 oz (100.5 kg)   SpO2 94%   BMI 35.77 kg/m   Physical Exam Pulse ox at rest on room air wearing his face mask: 95-97% Pulse ox room air ambulating twice around the nurses station.  Face mask on: 93 to 94%.  95% of the time he stayed at 94%. Pulse ox post ambulation at rest dipped to 92% initially but came up to 96% with taking a few deep breaths.  General appearance - alert, well appearing, older African-American male and in no distress Mental status - normal mood, behavior, speech, dress, motor activity, and thought processes Chest - clear to auscultation, no wheezes, rales or rhonchi, symmetric air entry Heart -regular rate and rhythm.  CMP Latest Ref Rng & Units 01/11/2021 11/26/2020 11/19/2020  Glucose 70 - 99 mg/dL - 213(H) 155(H)  BUN  8 - 23 mg/dL - 15 15  Creatinine 0.61 - 1.24 mg/dL - 1.63(H) 1.50(H)  Sodium 135 - 145 mmol/L - 140 138  Potassium 3.5 - 5.1 mmol/L - 4.1 3.8  Chloride 98 - 111 mmol/L - 102 104  CO2 22 - 32 mmol/L - 29 28  Calcium 8.9 - 10.3 mg/dL - 9.0 8.9  Total Protein 6.0 - 8.5 g/dL - - -  Total Bilirubin 0.0 - 1.2 mg/dL - - -  Alkaline Phos 44 - 121 IU/L - - -  AST 0 - 40 IU/L - - -  ALT 0 - 44 IU/L 8 - -   Lipid Panel     Component Value Date/Time   CHOL 97 (L) 01/11/2021 1104   TRIG 172 (H) 01/11/2021 1104   HDL 27 (L) 01/11/2021 1104   CHOLHDL 3.6 01/11/2021 1104   CHOLHDL 2.6 06/10/2016 1253   VLDL 18 06/10/2016 1253   LDLCALC 41 01/11/2021 1104    CBC    Component Value Date/Time    WBC 8.0 11/06/2020 1648   WBC 10.1 08/21/2020 1118   RBC 4.12 (L) 11/06/2020 1648   RBC 4.24 08/21/2020 1118   HGB 12.6 (L) 11/06/2020 1648   HCT 37.4 (L) 11/06/2020 1648   PLT 257 11/06/2020 1648   MCV 91 11/06/2020 1648   MCH 30.6 11/06/2020 1648   MCH 31.6 08/21/2020 1118   MCHC 33.7 11/06/2020 1648   MCHC 33.5 08/21/2020 1118   RDW 12.5 11/06/2020 1648   LYMPHSABS 1.9 06/11/2020 1145   MONOABS 2.3 (H) 05/18/2020 0304   EOSABS 0.3 06/11/2020 1145   BASOSABS 0.1 06/11/2020 1145    ASSESSMENT AND PLAN:  1. On home oxygen therapy Patient informed that based on his O2 readings, his insurance likely will not continue to pay for the home O2 since his pulse ox is staying above 88% at rest and with ambulation.   We will send in his information to his medical supplier [employer]  2. Essential hypertension Blood pressure above goal today.  Goal is 130/80 or lower.  Advised to check blood pressure daily and record the readings.  Bring readings with him next week when he comes to see me for his routine follow-up visit.  Continue carvedilol and furosemide at current dose for now    Patient was given the opportunity to ask questions.  Patient verbalized understanding of the plan and was able to repeat key elements of the plan.   No orders of the defined types were placed in this encounter.    Requested Prescriptions    No prescriptions requested or ordered in this encounter    No follow-ups on file.  Karle Plumber, MD, FACP

## 2021-04-08 ENCOUNTER — Other Ambulatory Visit (HOSPITAL_COMMUNITY): Payer: Self-pay

## 2021-04-08 DIAGNOSIS — Z1152 Encounter for screening for COVID-19: Secondary | ICD-10-CM | POA: Diagnosis not present

## 2021-04-08 DIAGNOSIS — Z9189 Other specified personal risk factors, not elsewhere classified: Secondary | ICD-10-CM | POA: Diagnosis not present

## 2021-04-09 ENCOUNTER — Other Ambulatory Visit (HOSPITAL_COMMUNITY): Payer: Self-pay

## 2021-04-09 MED ORDER — UNIFINE PENTIPS 32G X 4 MM MISC
Freq: Every day | 0 refills | Status: DC
  Filled 2021-04-09: qty 100, 90d supply, fill #0

## 2021-04-10 DIAGNOSIS — L309 Dermatitis, unspecified: Secondary | ICD-10-CM | POA: Diagnosis not present

## 2021-04-10 DIAGNOSIS — G4733 Obstructive sleep apnea (adult) (pediatric): Secondary | ICD-10-CM | POA: Diagnosis not present

## 2021-04-10 DIAGNOSIS — G4700 Insomnia, unspecified: Secondary | ICD-10-CM | POA: Diagnosis not present

## 2021-04-10 DIAGNOSIS — R269 Unspecified abnormalities of gait and mobility: Secondary | ICD-10-CM | POA: Diagnosis not present

## 2021-04-10 DIAGNOSIS — R2681 Unsteadiness on feet: Secondary | ICD-10-CM | POA: Diagnosis not present

## 2021-04-11 ENCOUNTER — Ambulatory Visit: Payer: Medicare Other | Attending: Internal Medicine | Admitting: Internal Medicine

## 2021-04-11 ENCOUNTER — Other Ambulatory Visit: Payer: Self-pay

## 2021-04-11 ENCOUNTER — Other Ambulatory Visit (HOSPITAL_COMMUNITY): Payer: Self-pay

## 2021-04-11 ENCOUNTER — Encounter: Payer: Self-pay | Admitting: Internal Medicine

## 2021-04-11 ENCOUNTER — Telehealth: Payer: Self-pay | Admitting: Endocrinology

## 2021-04-11 VITALS — BP 146/85 | HR 66 | Resp 16 | Wt 220.0 lb

## 2021-04-11 DIAGNOSIS — I5042 Chronic combined systolic (congestive) and diastolic (congestive) heart failure: Secondary | ICD-10-CM | POA: Diagnosis not present

## 2021-04-11 DIAGNOSIS — Z1211 Encounter for screening for malignant neoplasm of colon: Secondary | ICD-10-CM

## 2021-04-11 DIAGNOSIS — Z2821 Immunization not carried out because of patient refusal: Secondary | ICD-10-CM

## 2021-04-11 DIAGNOSIS — R079 Chest pain, unspecified: Secondary | ICD-10-CM

## 2021-04-11 DIAGNOSIS — I152 Hypertension secondary to endocrine disorders: Secondary | ICD-10-CM

## 2021-04-11 DIAGNOSIS — N1831 Chronic kidney disease, stage 3a: Secondary | ICD-10-CM

## 2021-04-11 DIAGNOSIS — E1159 Type 2 diabetes mellitus with other circulatory complications: Secondary | ICD-10-CM | POA: Diagnosis not present

## 2021-04-11 DIAGNOSIS — R232 Flushing: Secondary | ICD-10-CM

## 2021-04-11 DIAGNOSIS — E1142 Type 2 diabetes mellitus with diabetic polyneuropathy: Secondary | ICD-10-CM | POA: Diagnosis not present

## 2021-04-11 MED ORDER — AMLODIPINE BESYLATE 5 MG PO TABS
5.0000 mg | ORAL_TABLET | Freq: Every day | ORAL | 1 refills | Status: DC
Start: 2021-04-11 — End: 2021-09-23
  Filled 2021-04-11: qty 90, 90d supply, fill #0
  Filled 2021-07-01: qty 90, 90d supply, fill #1

## 2021-04-11 MED ORDER — GABAPENTIN 300 MG PO CAPS
300.0000 mg | ORAL_CAPSULE | Freq: Every day | ORAL | 3 refills | Status: DC
  Filled 2021-04-11: qty 30, 30d supply, fill #0
  Filled 2021-05-06: qty 30, 30d supply, fill #1
  Filled 2021-06-06: qty 30, 30d supply, fill #2
  Filled 2021-07-01: qty 30, 30d supply, fill #3

## 2021-04-11 NOTE — Patient Instructions (Addendum)
Increase your insulin to 95 units daily until you hear back from [name] Your blood pressure is not at goal.  Restart Norvasc 5 mg daily. Restart Gabapentin for neuropathy symptoms at 300 mg at bedtime. Please call and schedule an appointment with [name]. Do not forget to reschedule your colonoscopy.

## 2021-04-11 NOTE — Progress Notes (Signed)
Patient ID: ADETOKUNBO MCCADDEN, male    DOB: October 09, 1955  MRN: [id number]  CC: Follow-up (Patient experiencing hot flashes and tightness of chest )   Subjective: Marco [name] is a 65 y.o. male who presents for chronic ds management His concerns today include:  HTN, DM 2 with neuropathy, and nephropathy, CKD stage III, obesity, nonobstructive CAD/cardiac amyloid, chronic combined CHF EF 50-55% in 08/2020, PAF s/p ablation 2017 (Xarelto d/c due to recurrent anemia), OSA on VPAP, on Cosentyx and followed by rheumatology in [facility] for psoriasis/gout/nonspec arthritis, chronic insomnia. On narcotics through [facility] for chronic pain in chest/back/shoulders/neck, perinephritic abscess 12/2019.    DM:  BS in the mid 200s and higher for past 1 wk.  Has Dexcom.  Pt feels the Humalog insulin 50/50 mix may not be working as well for him.  He is currently on 90 units daily.  Based on Dr. Cordelia Pen last note, patient was supposed to be on 75 units.  He contacted his endocrinologist office today to inform of the high blood sugars and was initially told to increase it to 80 units.  However patient informed them that he has been taking 90 units.  He is awaiting a call back.  He has a follow-up appointment scheduled for the sixth of this month. Eating habits have not changed -c/o a lot of discomfort in his feet and hands from neuropathy.  Been off Gabapentin for a while  HTN/CAD/CHF: check BP last 2 days.  Yesterday 141/70 and today it was 145/72 Currently he is on carvedilol and furosemide.  He limits salt in the foods.   -over past 2 wks, he has been having episodes of feeling hot in his body.  This is followed by feeling of SOB and tightness in his chest.  He has to sit under a fan then symptoms subside after about 5 mins. Not related to activity No wgh changes.  No palpitations, LE edema, PND. Feels SOB sometimes with walking from his bed to the bathroom. Wants to be able to keep his oxygen to use on  these occasions. We did a walk test with him last wk and O2 levels stayed above 90  Still on Cosentyx for his rheumatologic dx.  Has f/u appt with his rheumatologist next wk.  CKD:  has not seen kidney specialist in a while.  GFR has remained fairly stable in the upper 40s-50s.  HM:  Declines flu shot.  Had shingles vaccine and pneumonia vaccine at [facility]. Due for c-scope.  Was suppose to get c-scope last mth but he put it off because he was having surgery to remove kidney stones.  Patient Active Problem List   Diagnosis Date Noted   Cellulitis of finger of left hand 10/20/2020   Nasal congestion 10/20/2020   History of Clostridium difficile infection 10/20/2020   Cough 10/14/2020   Healthcare maintenance 06/15/2020   Perinephric abscess 05/13/2020   Pyohydronephrosis 05/13/2020   Vitamin B12 deficiency 02/19/2020   Elevated LFTs    Pleural effusion    Weakness    Loculated pleural effusion    Complex renal cyst    Urethral stricture 12/30/2019   Nephrolithiasis 12/30/2019   Acute febrile illness 12/30/2019   Sepsis ([place]) 12/27/2019   Severe sepsis ([place]) 11/94/1740   Acute metabolic encephalopathy 81/44/8185   Sepsis due to Gram negative bacteria ([place]) 12/09/2019   Rhinitis 10/16/2019   Gastroesophageal reflux disease without esophagitis 08/31/2018   Muscle spasm 08/31/2018   NICM (nonischemic  cardiomyopathy) ([place]) 07/06/2018   Hypokalemia    Acute renal failure superimposed on stage 3 chronic kidney disease ([place]) 08/27/2017   Hyperlipidemia 12/23/2016   Chronic combined systolic and diastolic CHF (congestive heart failure) ([place]) 09/09/2016   Acute respiratory failure with hypoxia ([place]) 09/02/2016   Diabetic polyneuropathy associated with type 2 diabetes mellitus ([place]) 06/10/2016   Vision changes 06/02/2016   Myalgia and myositis 03/31/2016   Shortness of breath 02/09/2016   Chronic gouty arthritis 11/29/2015   LBP (low back pain) 11/27/2015   Arthralgia  of multiple joints 11/27/2015   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[place]) 11/27/2015   Breast pain in male 11/14/2015   Sinusitis, chronic 10/16/2015   Chronic insomnia 10/16/2015   New onset of headaches after age 75 09/20/2015   OSA (obstructive sleep apnea) 07/12/2015   Low serum testosterone level 05/18/2015   Depression 05/16/2015   Morbid obesity due to excess calories (HCC)    Chronic pain    Type 2 diabetes mellitus with stage 3 chronic kidney disease, without long-term current use of insulin (HCC)    Rheumatoid arthritis ([place]) 02/05/2015   Elevated troponin 12/24/2014   CAD (coronary artery disease) 12/24/2014   Atrial fibrillation ([place]) 12/24/2014   Essential hypertension 12/24/2014   Chronic kidney disease 12/24/2014   PUD (peptic ulcer disease) 12/24/2014     Current Outpatient Medications on File Prior to Visit  Medication Sig Dispense Refill   Accu-Chek Softclix Lancets lancets USE AS INSTRUCTED 100 each 12   albuterol (VENTOLIN HFA) 108 (90 Base) MCG/ACT inhaler INHALE 2 PUFFS INTO THE LUNGS EVERY 6 HOURS AS NEEDED FOR WHEEZING OR SHORTNESS OF BREATH. 8.5 g 1   allopurinol (ZYLOPRIM) 100 MG tablet Take 2 tablets by mouth daily 60 tablet 5   aspirin (EQ ASPIRIN ADULT LOW DOSE) 81 MG EC tablet Take 1 tablet (81 mg total) by mouth daily. TAKE 1 TABLET BY MOUTH ONCE DAILY. SWALLOW WHOLE Please hold asa until you repeat blood work, resume if blood count stable and ok with you primary care doctor     atorvastatin (LIPITOR) 40 MG tablet Take 1 tablet (40 mg total) by mouth daily. 90 tablet 3   carvedilol (COREG) 12.5 MG tablet TAKE 1 TABLET BY MOUTH TWO TIMES DAILY WITH MEALS 180 tablet 1   clobetasol cream (TEMOVATE) 0.05 % Apply to affected area twice a day 30 g 0   Continuous Blood Gluc Receiver (DEXCOM G6 RECEIVER) DEVI Use as instructed to monitor blood sugar daily 1 each 0   Continuous Blood Gluc Sensor (DEXCOM G6 SENSOR) MISC Use as instructed to monitor blood sugar daily. Change  every 10 days. 9 each 3   Continuous Blood Gluc Transmit (DEXCOM G6 TRANSMITTER) MISC Use as instructed to monitor blood sugar daily. Change every 90 days. 1 each 3   COSENTYX SENSOREADY PEN 150 MG/ML SOAJ SMARTSIG:1 Pre-Filled Pen Syringe SUB-Q Every 4 Weeks     dextromethorphan-guaiFENesin (MUCINEX DM) 30-600 MG 12hr tablet Take 1 tablet by mouth 2 (two) times daily as needed for cough. 30 tablet 1   diclofenac Sodium (VOLTAREN) 1 % GEL Apply 4 g topically 4 (four) times daily. (Patient taking differently: Apply 4 g topically 4 (four) times daily as needed (pain).) 100 g 0   DULoxetine (CYMBALTA) 20 MG capsule TAKE 1 CAPSULE BY MOUTH ONCE A DAY 90 capsule 0   eszopiclone (LUNESTA) 1 MG TABS tablet TAKE 1 TABLET BY MOUTH EVERY NIGHT IMMEDIATELY BEFORE BEDTIME AS NEEDED FOR SLEEP 30 tablet 1  famotidine (PEPCID) 20 MG tablet TAKE 1 TABLET BY MOUTH 2 TIMES DAILY 60 tablet 2   fexofenadine (ALLEGRA) 180 MG tablet Take 180 mg by mouth daily.     fluocinonide cream (LIDEX) 0.05 % Apply 1 (one) application to affected area twice a day 60 g 0   fluticasone (FLONASE) 50 MCG/ACT nasal spray PLACE 1 SPRAY INTO BOTH NOSTRILS DAILY AS NEEDED FOR ALLERGIES OR RHINITIS. 16 g 1   furosemide (LASIX) 40 MG tablet Take 1 tablet (40 mg total) by mouth every evening. 90 tablet 3   furosemide (LASIX) 80 MG tablet Take 1 tablet (80 mg total) by mouth every morning. 90 tablet 3   glucose blood test strip USE TO TEST 4 TIMES DAILY. (Patient taking differently: USE TO TEST 4 TIMES DAILY.) 400 strip 3   Insulin Lispro Prot & Lispro (HUMALOG MIX 50/50 KWIKPEN) (50-50) 100 UNIT/ML Kwikpen Inject 75 Units into the skin daily with breakfast. 75 mL 3   Insulin Pen Needle (UNIFINE PENTIPS) 32G X 4 MM MISC USE AS DIRECTED DAILY 100 each 0   mupirocin cream (BACTROBAN) 2 % Apply 1 application topically 2 (two) times daily. 15 g 0   naloxone (NARCAN) nasal spray 4 mg/0.1 mL Place in nostril in the event of suspected overdose on  Narcotic medication (Oxycodone) 1 each 0   oxyCODONE (OXY IR/ROXICODONE) 5 MG immediate release tablet Take 5 mg by mouth 3 (three) times daily as needed for moderate pain.      oxymetazoline (12 HOUR DECONGESTANT) 0.05 % nasal spray Place 1 spray into both nostrils 2 (two) times daily. 30 mL 0   polyethylene glycol (MIRALAX / GLYCOLAX) 17 g packet Take 17 g by mouth daily as needed for moderate constipation or severe constipation. 14 each 0   potassium chloride SA (KLOR-CON) 20 MEQ tablet Take 1 and 1/2 tablets (30 mEq total) by mouth daily. 90 tablet 3   Secukinumab (COSENTYX SENSOREADY PEN) 150 MG/ML SOAJ INJECT ONE PEN SUBCUTANEOUSLY EVERY 4 WEEKS. REFRIGERATE. ALLOW 15 TO 30 MINUTES AT ROOM TEMP PRIOR TO ADMINISTRATION. 3 mL 1   Sodium Chloride-Sodium Bicarb (KETTLE NETI POT SINUS WASH) 2300-700 MG KIT Flush the sinuses twice a week.  Asked pharmacist to show you how to use. 1 kit 0   Tafamidis 61 MG CAPS TAKE 1 CAPSULE (61 MG) BY MOUTH DAILY. 30 capsule 11   triamcinolone cream (KENALOG) 0.1 % APPLY TO THE AFFECTED AREA(S) TOPICALLY 2 TIMES DAILY AS DIRECTED 454 g 0   UNIFINE PENTIPS 32G X 4 MM MISC USE AS DIRECTED DAILY 100 each 0   [DISCONTINUED] hydrALAZINE (APRESOLINE) 25 MG tablet Take 1 tablet (25 mg total) by mouth 3 (three) times daily. 90 tablet 3   [DISCONTINUED] isosorbide mononitrate (IMDUR) 30 MG 24 hr tablet Take 1 tablet (30 mg total) by mouth daily. 30 tablet 3   [DISCONTINUED] ramelteon (ROZEREM) 8 MG tablet TAKE 1 TABLET BY MOUTH AT BEDTIME 30 tablet 1   No current facility-administered medications on file prior to visit.    Allergies  Allergen Reactions   Nsaids Other (See Comments)    Stomach pains. Ulcers - stated by patient    Jardiance [Empagliflozin] Nausea And Vomiting    Social History   Socioeconomic History   Marital status: Married    Spouse name: Amethyst   Number of children: 8   Years of education: 13   Highest education level: Some college, no  degree  Occupational History    Comment:  disabled  Tobacco Use   Smoking status: Former    Packs/day: 0.50    Years: 10.00    Pack years: 5.00    Types: Cigarettes    Quit date: 04/11/2008    Years since quitting: 13.0   Smokeless tobacco: Never  Vaping Use   Vaping Use: Never used  Substance and Sexual Activity   Alcohol use: No    Alcohol/week: 0.0 standard drinks   Drug use: No   Sexual activity: Yes    Partners: Female    Birth control/protection: None  Other Topics Concern   Not on file  Social History Narrative   Lives with wife. Does not work.  On disability.    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 Provided crisis assistance program information to assist with bills       Has issues getting food- gets $36/month in food stamps- already has list of food pantries but has not tried them- encouraged pt to try food pantries and reach out to clinic for help if needed   Social Determinants of Health   Financial Resource Strain: Low Risk    Difficulty of Paying Living Expenses: Not hard at all  Food Insecurity: No Food Insecurity   Worried About Charity fundraiser in the Last Year: Never true   [place] in the Last Year: Never true  Transportation Needs: No Transportation Needs   Lack of Transportation (Medical): No   Lack of Transportation (Non-Medical): No  Physical Activity: Inactive   Days of Exercise per Week: 0 days   Minutes of Exercise per Session: 0 min  Stress: No Stress Concern Present   Feeling of Stress : Not at all  Social Connections: Moderately Isolated   Frequency of Communication with Friends and Family: Once a week   Frequency of Social Gatherings with Friends and Family: Never   Attends Religious Services: More than 4 times per year   Active Member of Clubs or  Organizations: Yes   Attends Music therapist: More than 4 times per year   Marital Status: Separated  Intimate Partner Violence: Not At Risk   Fear of Current or Ex-Partner: No   Emotionally Abused: No   Physically Abused: No   Sexually Abused: No    Family History  Problem Relation Age of Onset   Hypertension Mother    Diabetes Mother    COPD Mother    Lung cancer Mother        Smoker    Hypertension Father    Diabetes Father    Heart Problems Father    COPD Father    Colon cancer Neg Hx    Stomach cancer Neg Hx     Past Surgical History:  Procedure Laterality Date   CARDIAC CATHETERIZATION  05/2014   ablation for atrial fibrillation   CARDIAC CATHETERIZATION N/A 11/16/2015   Procedure: Left Heart Cath and Coronary Angiography;  Surgeon: Leonie Man, MD;  Location: [facility];  Service: Cardiovascular;  Laterality: N/A;   COLON RESECTION  09/2013   due to large, abnormal polpy. non cancerous per patient.    COLON SURGERY  09/2014   colon resection    EXTRACORPOREAL SHOCK WAVE LITHOTRIPSY Left 01/03/2021   Procedure: EXTRACORPOREAL SHOCK WAVE LITHOTRIPSY (  ESWL);  Surgeon: Robley Fries, MD;  Location: [facility];  Service: Urology;  Laterality: Left;   IR PERC PLEURAL DRAIN W/INDWELL CATH W/IMG GUIDE  01/07/2020   LEFT HEART CATH AND CORONARY ANGIOGRAPHY N/A 05/19/2018   Procedure: LEFT HEART CATH AND CORONARY ANGIOGRAPHY;  Surgeon: Wellington Hampshire, MD;  Location: [facility];  Service: Cardiovascular;  Laterality: N/A;    ROS: Review of Systems Negative except as stated above  PHYSICAL EXAM: BP (!) 146/85   Pulse 66   Resp 16   Wt 220 lb (99.8 kg)   SpO2 95%   BMI 35.51 kg/m   Wt Readings from Last 3 Encounters:  04/11/21 220 lb (99.8 kg)  04/05/21 221 lb 9.6 oz (100.5 kg)  02/19/21 217 lb (98.4 kg)    Physical Exam  General appearance - alert, well appearing, and in no distress Mental status - normal  mood, behavior, speech, dress, motor activity, and thought processes Neck - supple, no significant adenopathy Chest - clear to auscultation, no wheezes, rales or rhonchi, symmetric air entry Heart - normal rate, regular rhythm, normal S1, S2, no murmurs, rubs, clicks or gallops Extremities - peripheral pulses normal, no pedal edema, no clubbing or cyanosis Diabetic Foot Exam - Simple   Simple Foot Form Visual Inspection See comments: Yes Sensation Testing Intact to touch and monofilament testing bilaterally: Yes Pulse Check Posterior Tibialis and Dorsalis pulse intact bilaterally: Yes Comments Patient is flat-footed.  Toenails are slightly overgrown.     CMP Latest Ref Rng & Units 01/11/2021 11/26/2020 11/19/2020  Glucose 70 - 99 mg/dL - 213(H) 155(H)  BUN 8 - 23 mg/dL - 15 15  Creatinine 0.61 - 1.24 mg/dL - 1.63(H) 1.50(H)  Sodium 135 - 145 mmol/L - 140 138  Potassium 3.5 - 5.1 mmol/L - 4.1 3.8  Chloride 98 - 111 mmol/L - 102 104  CO2 22 - 32 mmol/L - 29 28  Calcium 8.9 - 10.3 mg/dL - 9.0 8.9  Total Protein 6.0 - 8.5 g/dL - - -  Total Bilirubin 0.0 - 1.2 mg/dL - - -  Alkaline Phos 44 - 121 IU/L - - -  AST 0 - 40 IU/L - - -  ALT 0 - 44 IU/L 8 - -   Lipid Panel     Component Value Date/Time   CHOL 97 (L) 01/11/2021 1104   TRIG 172 (H) 01/11/2021 1104   HDL 27 (L) 01/11/2021 1104   CHOLHDL 3.6 01/11/2021 1104   CHOLHDL 2.6 06/10/2016 1253   VLDL 18 06/10/2016 1253   LDLCALC 41 01/11/2021 1104    CBC    Component Value Date/Time   WBC 8.0 11/06/2020 1648   WBC 10.1 08/21/2020 1118   RBC 4.12 (L) 11/06/2020 1648   RBC 4.24 08/21/2020 1118   HGB 12.6 (L) 11/06/2020 1648   HCT 37.4 (L) 11/06/2020 1648   PLT 257 11/06/2020 1648   MCV 91 11/06/2020 1648   MCH 30.6 11/06/2020 1648   MCH 31.6 08/21/2020 1118   MCHC 33.7 11/06/2020 1648   MCHC 33.5 08/21/2020 1118   RDW 12.5 11/06/2020 1648   LYMPHSABS 1.9 06/11/2020 1145   MONOABS 2.3 (H) 05/18/2020 0304   EOSABS 0.3  06/11/2020 1145   BASOSABS 0.1 06/11/2020 1145    ASSESSMENT AND PLAN: 1. Type 2 diabetes mellitus with peripheral neuropathy (HCC) Recommend increasing his Humalog 50-50 mix from 90 units daily to 95 units daily until he hears back from his endocrinologist.  Restart low-dose gabapentin for neuropathy symptoms. - Microalbumin / creatinine urine ratio - Basic Metabolic Panel - gabapentin (NEURONTIN) 300 MG capsule; Take 1 capsule (300 mg total) by mouth at bedtime.  Dispense: 30 capsule; Refill: 3  2. Hypertension associated with diabetes ([place]) Not at goal. Add Norvasc 5 mg daily.  Continue DASH diet. - amLODipine (NORVASC) 5 MG tablet; Take 1 tablet (5 mg total) by mouth daily.  Dispense: 90 tablet; Refill: 1  3. Chest pain at rest 4. Hot flash in male Patient with feeling of hot flash that is then associated with chest pain.  Sounds atypical but nonetheless, I told him to call Dr. Theodosia Blender office tomorrow and request an appointment based on his symptoms.  I will check thyroid level.  Continue carvedilol and aspirin - TSH+T4F+T3Free  5. Chronic combined systolic and diastolic CHF, NYHA class 2 (HCC) Compensated.  Continue furosemide, and carvedilol.  Entresto and hydralazine held last yr due to kidney function -I will inquire from our caseworker whether patient's insurance  would allow him to keep his home O2 to use as needed given his diagnosis of CHF.  Think he would benefit from being able to keep the oxygen and use it as needed.  6. Stage 3a chronic kidney disease ([place]) Stable at this time.  7. Screening for colon cancer Patient will call and reschedule his colonoscopy.  8. Influenza vaccination declined Recommended.  Patient declined.    Patient was given the opportunity to ask questions.  Patient verbalized understanding of the plan and was able to repeat key elements of the plan.   Orders Placed This Encounter  Procedures   TSH+T4F+T3Free   Microalbumin / creatinine  urine ratio   Basic Metabolic Panel      Requested Prescriptions   Signed Prescriptions Disp Refills   amLODipine (NORVASC) 5 MG tablet 90 tablet 1    Sig: Take 1 tablet (5 mg total) by mouth daily.   gabapentin (NEURONTIN) 300 MG capsule 30 capsule 3    Sig: Take 1 capsule (300 mg total) by mouth at bedtime.    Return in about 4 months (around 08/11/2021).  Karle Plumber, MD, FACP

## 2021-04-11 NOTE — Telephone Encounter (Signed)
Pt calling in stating that his sugars have been over 200 for more than a week. Scheduled patient for 04/16/2021 for an appointment. Pt would like a call back regarding what to do. States that he has a Dexcom and that we would have to pull his numbers off of there because he hasn't wrote them down.

## 2021-04-11 NOTE — Telephone Encounter (Signed)
Pt said that he is take 90 units every morning  , he said that he was told increase by  you. Please advise.

## 2021-04-12 ENCOUNTER — Encounter (INDEPENDENT_AMBULATORY_CARE_PROVIDER_SITE_OTHER): Payer: Medicare Other | Admitting: Endocrinology

## 2021-04-12 ENCOUNTER — Other Ambulatory Visit (HOSPITAL_COMMUNITY): Payer: Self-pay

## 2021-04-12 DIAGNOSIS — L309 Dermatitis, unspecified: Secondary | ICD-10-CM | POA: Diagnosis not present

## 2021-04-12 DIAGNOSIS — N183 Chronic kidney disease, stage 3 unspecified: Secondary | ICD-10-CM | POA: Diagnosis not present

## 2021-04-12 DIAGNOSIS — E1122 Type 2 diabetes mellitus with diabetic chronic kidney disease: Secondary | ICD-10-CM

## 2021-04-12 LAB — BASIC METABOLIC PANEL
BUN/Creatinine Ratio: 8 — ABNORMAL LOW (ref 10–24)
BUN: 11 mg/dL (ref 8–27)
CO2: 23 mmol/L (ref 20–29)
Calcium: 9 mg/dL (ref 8.6–10.2)
Chloride: 99 mmol/L (ref 96–106)
Creatinine, Ser: 1.39 mg/dL — ABNORMAL HIGH (ref 0.76–1.27)
Glucose: 286 mg/dL — ABNORMAL HIGH (ref 65–99)
Potassium: 4 mmol/L (ref 3.5–5.2)
Sodium: 141 mmol/L (ref 134–144)
eGFR: 56 mL/min/{1.73_m2} — ABNORMAL LOW (ref >59)

## 2021-04-12 LAB — MICROALBUMIN / CREATININE URINE RATIO
Creatinine, Urine: 61 mg/dL
Microalb/Creat Ratio: 17 mg/g creat (ref 0–29)
Microalbumin, Urine: 10.6 ug/mL

## 2021-04-12 LAB — TSH+T4F+T3FREE
Free T4: 0.95 ng/dL (ref 0.82–1.77)
T3, Free: 2.9 pg/mL (ref 2.0–4.4)
TSH: 1.72 u[IU]/mL (ref 0.450–4.500)

## 2021-04-12 MED ORDER — ACITRETIN 25 MG PO CAPS
ORAL_CAPSULE | ORAL | 0 refills | Status: DC
  Filled 2021-04-12: qty 42, 28d supply, fill #0

## 2021-04-12 NOTE — Telephone Encounter (Signed)
Called and spoke with patient regard  change in units, advised pt per dr Loanne Drilling to increase to 95 units in the mornings . Pt understood w/o any concerns.

## 2021-04-12 NOTE — Progress Notes (Signed)
I reviewed continuous glucose monitor data.  Glucose varies from 100-350.  It is in general highest at [facility], and less high at [facility] and 10PM.  It is in general lowest at [place], although, there is a gap in data 2AM-6AM.

## 2021-04-15 DIAGNOSIS — J9601 Acute respiratory failure with hypoxia: Secondary | ICD-10-CM | POA: Diagnosis not present

## 2021-04-16 ENCOUNTER — Ambulatory Visit (INDEPENDENT_AMBULATORY_CARE_PROVIDER_SITE_OTHER): Payer: Medicare Other | Admitting: Endocrinology

## 2021-04-16 ENCOUNTER — Encounter: Payer: Self-pay | Admitting: Endocrinology

## 2021-04-16 ENCOUNTER — Other Ambulatory Visit: Payer: Self-pay

## 2021-04-16 ENCOUNTER — Telehealth: Payer: Self-pay | Admitting: Cardiology

## 2021-04-16 ENCOUNTER — Other Ambulatory Visit (HOSPITAL_COMMUNITY): Payer: Self-pay

## 2021-04-16 VITALS — BP 122/66 | HR 77 | Ht 64.0 in | Wt 218.4 lb

## 2021-04-16 DIAGNOSIS — E1142 Type 2 diabetes mellitus with diabetic polyneuropathy: Secondary | ICD-10-CM

## 2021-04-16 DIAGNOSIS — N183 Chronic kidney disease, stage 3 unspecified: Secondary | ICD-10-CM | POA: Diagnosis not present

## 2021-04-16 DIAGNOSIS — E1122 Type 2 diabetes mellitus with diabetic chronic kidney disease: Secondary | ICD-10-CM | POA: Diagnosis not present

## 2021-04-16 DIAGNOSIS — R072 Precordial pain: Secondary | ICD-10-CM

## 2021-04-16 LAB — POCT GLYCOSYLATED HEMOGLOBIN (HGB A1C): Hemoglobin A1C: 9.1 % — AB (ref 4.0–5.6)

## 2021-04-16 MED ORDER — HUMALOG MIX 50/50 KWIKPEN (50-50) 100 UNIT/ML ~~LOC~~ SUPN
95.0000 [IU] | PEN_INJECTOR | Freq: Every day | SUBCUTANEOUS | 3 refills | Status: DC
  Filled 2021-04-16 – 2021-06-06 (×3): qty 105, 110d supply, fill #0
  Filled 2021-06-28: qty 90, 90d supply, fill #0

## 2021-04-16 NOTE — Telephone Encounter (Signed)
Spoke with the patient who reports that he has been having intermittent chest tightness for a few weeks now. He states that it is a sudden onset not associated with exertion. He states that he becomes very hot and has some mild shortness of breath with the episodes. It lasts a couple of minutes and once he is able to cool off the pain goes away. He reports that he saw his PCP in regards to the pain last Thursday and was advised to follow up with cardiology. He denies any current chest pain. Patient scheduled for an appointment on 9/19 and advised on ER precautions.

## 2021-04-16 NOTE — Patient Instructions (Addendum)
check your blood sugar twice a day.  vary the time of day when you check, between before the 3 meals, and at bedtime.  also check if you have symptoms of your blood sugar being too high or too low.  please keep a record of the readings and bring it to your next appointment here (or you can bring the meter itself).  You can write it on any piece of paper.  please call us sooner if your blood sugar goes below 70, or if most of your readings are over 200.   Please continue the same insulin. On this type of insulin schedule, you should eat meals on a regular schedule (especially lunch).  If a meal is missed or significantly delayed, your blood sugar could go low.   Please come back for a follow-up appointment in 2 months.

## 2021-04-16 NOTE — Telephone Encounter (Signed)
Pt c/o of Chest Pain: STAT if CP now or developed within 24 hours  1. Are you having CP right now?  Chest Tightness not having it now Last time pt had Chest Tightness was Saturday  2. Are you experiencing any other symptoms (ex. SOB, nausea, vomiting, sweating)?  SOB and Sweating  3. How long have you been experiencing CP?  About 2 weeks  4. Is your CP continuous or coming and going? Coming and going  5. Have you taken Nitroglycerin? No   Pt saw his PCP Thursday ?

## 2021-04-16 NOTE — Progress Notes (Signed)
 Subjective:    Patient ID: Marco [name], male    DOB: 03/15/1956, 65 y.o.   MRN: [id number]  HPI Pt returns for f/u of diabetes mellitus: DM type: Insulin-requiring type 2 Dx'ed: 2009 Complications: PN, CAD, NPDR, and stage 3a CRI.  Therapy: insulin since soon after dx DKA: never Severe hypoglycemia: once (2020) Pancreatitis: never.  Pancreatic imaging: never.  Other: he intermittently takes prednisone, for psoriasis; he declined to continue multiple daily injections; due to renal failure (and fasting hypoglycemia), he takes 50/50 insulin; he declines GLP med. Interval history: no recent steroids.  Pt says cbg varies from 176-240.  It is in general higher as the day goes on.  pt says he never misses the insulin.  He increased to 95 units qam, 3 days ago.   Past Medical History:  Diagnosis Date   Benign colon polyp 08/01/2013   [facility] in [place]. large base tranverse colon polyp was biopsied. polyp was benign with minimal surface hyperplastic change.   Chronic combined systolic and diastolic CHF (congestive heart failure) (HCC)    Chronic pain    CKD (chronic kidney disease), stage III (HCC)    Diabetes mellitus without complication (HCC)    Diverticulosis    Esophageal hiatal hernia 07/29/2013   confirmed on EGD    Esophageal stricture    Essential hypertension    Gastritis 07/29/2013   confirmed on EGD, bx done an negative for intestinal metaplasia, dsyplasia or H. pylori. normal gastric emptying study done 07/13/2013.   GERD (gastroesophageal reflux disease)    Morbid obesity (HCC)    Non-obstructive CAD    a. 02/2013 Cath ([place]): nonobs dzs;  b. 09/2014 Myoview ([place]): EF 55%, no ischemia;  c. Cath 05/2018 mild CAD no obstruction.   Persistent atrial fibrillation (HCC)    a. 02/2013 s/p rfca in [place], [place]-->prev on Xarelto, d/c'd 2/2 anemia, ? GIB.   Rheumatoid arthritis (HCC)    Sigmoid diverticulosis 08/01/2013   confirmed on colonscopy. record scanned  into chart    Past Surgical History:  Procedure Laterality Date   CARDIAC CATHETERIZATION  05/2014   ablation for atrial fibrillation   CARDIAC CATHETERIZATION N/A 11/16/2015   Procedure: Left Heart Cath and Coronary Angiography;  Surgeon: David W Harding, MD;  Location: [facility];  Service: Cardiovascular;  Laterality: N/A;   COLON RESECTION  09/2013   due to large, abnormal polpy. non cancerous per patient.    COLON SURGERY  09/2014   colon resection    EXTRACORPOREAL SHOCK WAVE LITHOTRIPSY Left 01/03/2021   Procedure: EXTRACORPOREAL SHOCK WAVE LITHOTRIPSY (ESWL);  Surgeon: Pace, Maryellen D, MD;  Location: [facility];  Service: Urology;  Laterality: Left;   IR PERC PLEURAL DRAIN W/INDWELL CATH W/IMG GUIDE  01/07/2020   LEFT HEART CATH AND CORONARY ANGIOGRAPHY N/A 05/19/2018   Procedure: LEFT HEART CATH AND CORONARY ANGIOGRAPHY;  Surgeon: Arida, Muhammad A, MD;  Location: [facility];  Service: Cardiovascular;  Laterality: N/A;    Social History   Socioeconomic History   Marital status: Married    Spouse name: Amethyst   Number of children: 8   Years of education: 13   Highest education level: Some college, no degree  Occupational History    Comment: disabled  Tobacco Use   Smoking status: Former    Packs/day: 0.50    Years: 10.00    Pack years: 5.00    Types: Cigarettes    Quit date: 04/11/2008      Years since quitting: 13.0   Smokeless tobacco: Never  Vaping Use   Vaping Use: Never used  Substance and Sexual Activity   Alcohol use: No    Alcohol/week: 0.0 standard drinks   Drug use: No   Sexual activity: Yes    Partners: Female    Birth control/protection: None  Other Topics Concern   Not on file  Social History Narrative   Lives with wife. Does not work.  On disability.    Caffeine- coffee, 1/2 cup daily      10/31- gets retirement and workmans comp from old job in [place]- has trouble paying for utilities consistently- had water turned off  but paid it on credit and now owes money on his card... Provided crisis assistance program information to assist with bills       Has issues getting food- gets $36/month in food stamps- already has list of food pantries but has not tried them- encouraged pt to try food pantries and reach out to clinic for help if needed   Social Determinants of Health   Financial Resource Strain: Low Risk    Difficulty of Paying Living Expenses: Not hard at all  Food Insecurity: No Food Insecurity   Worried About Running Out of Food in the Last Year: Never true   Ran Out of Food in the Last Year: Never true  Transportation Needs: No Transportation Needs   Lack of Transportation (Medical): No   Lack of Transportation (Non-Medical): No  Physical Activity: Inactive   Days of Exercise per Week: 0 days   Minutes of Exercise per Session: 0 min  Stress: No Stress Concern Present   Feeling of Stress : Not at all  Social Connections: Moderately Isolated   Frequency of Communication with Friends and Family: Once a week   Frequency of Social Gatherings with Friends and Family: Never   Attends Religious Services: More than 4 times per year   Active Member of Clubs or Organizations: Yes   Attends Club or Organization Meetings: More than 4 times per year   Marital Status: Separated  Intimate Partner Violence: Not At Risk   Fear of Current or Ex-Partner: No   Emotionally Abused: No   Physically Abused: No   Sexually Abused: No    Current Outpatient Medications on File Prior to Visit  Medication Sig Dispense Refill   Accu-Chek Softclix Lancets lancets USE AS INSTRUCTED 100 each 12   acitretin (SORIATANE) 25 MG capsule Take 2 capsules by mouth with food daily for 2 weeks then take 1 capsule daily 42 capsule 0   albuterol (VENTOLIN HFA) 108 (90 Base) MCG/ACT inhaler INHALE 2 PUFFS INTO THE LUNGS EVERY 6 HOURS AS NEEDED FOR WHEEZING OR SHORTNESS OF BREATH. 8.5 g 1   allopurinol (ZYLOPRIM) 100 MG tablet Take 2  tablets by mouth daily 60 tablet 5   amLODipine (NORVASC) 5 MG tablet Take 1 tablet (5 mg total) by mouth daily. 90 tablet 1   aspirin (EQ ASPIRIN ADULT LOW DOSE) 81 MG EC tablet Take 1 tablet (81 mg total) by mouth daily. TAKE 1 TABLET BY MOUTH ONCE DAILY. SWALLOW WHOLE Please hold asa until you repeat blood work, resume if blood count stable and ok with you primary care doctor     atorvastatin (LIPITOR) 40 MG tablet Take 1 tablet (40 mg total) by mouth daily. 90 tablet 3   carvedilol (COREG) 12.5 MG tablet TAKE 1 TABLET BY MOUTH TWO TIMES DAILY WITH MEALS 180 tablet 1     clobetasol cream (TEMOVATE) 0.05 % Apply to affected area twice a day 30 g 0   Continuous Blood Gluc Receiver (DEXCOM G6 RECEIVER) DEVI Use as instructed to monitor blood sugar daily 1 each 0   Continuous Blood Gluc Sensor (DEXCOM G6 SENSOR) MISC Use as instructed to monitor blood sugar daily. Change every 10 days. 9 each 3   Continuous Blood Gluc Transmit (DEXCOM G6 TRANSMITTER) MISC Use as instructed to monitor blood sugar daily. Change every 90 days. 1 each 3   COSENTYX SENSOREADY PEN 150 MG/ML SOAJ SMARTSIG:1 Pre-Filled Pen Syringe SUB-Q Every 4 Weeks     dextromethorphan-guaiFENesin (MUCINEX DM) 30-600 MG 12hr tablet Take 1 tablet by mouth 2 (two) times daily as needed for cough. 30 tablet 1   diclofenac Sodium (VOLTAREN) 1 % GEL Apply 4 g topically 4 (four) times daily. (Patient taking differently: Apply 4 g topically 4 (four) times daily as needed (pain).) 100 g 0   DULoxetine (CYMBALTA) 20 MG capsule TAKE 1 CAPSULE BY MOUTH ONCE A DAY 90 capsule 0   eszopiclone (LUNESTA) 1 MG TABS tablet TAKE 1 TABLET BY MOUTH EVERY NIGHT IMMEDIATELY BEFORE BEDTIME AS NEEDED FOR SLEEP 30 tablet 1   famotidine (PEPCID) 20 MG tablet TAKE 1 TABLET BY MOUTH 2 TIMES DAILY 60 tablet 2   fexofenadine (ALLEGRA) 180 MG tablet Take 180 mg by mouth daily.     fluocinonide cream (LIDEX) 0.05 % Apply 1 (one) application to affected area twice a day 60 g  0   fluticasone (FLONASE) 50 MCG/ACT nasal spray PLACE 1 SPRAY INTO BOTH NOSTRILS DAILY AS NEEDED FOR ALLERGIES OR RHINITIS. 16 g 1   furosemide (LASIX) 40 MG tablet Take 1 tablet (40 mg total) by mouth every evening. 90 tablet 3   furosemide (LASIX) 80 MG tablet Take 1 tablet (80 mg total) by mouth every morning. 90 tablet 3   gabapentin (NEURONTIN) 300 MG capsule Take 1 capsule (300 mg total) by mouth at bedtime. 30 capsule 3   glucose blood test strip USE TO TEST 4 TIMES DAILY. (Patient taking differently: USE TO TEST 4 TIMES DAILY.) 400 strip 3   Insulin Pen Needle (UNIFINE PENTIPS) 32G X 4 MM MISC USE AS DIRECTED DAILY 100 each 0   mupirocin cream (BACTROBAN) 2 % Apply 1 application topically 2 (two) times daily. 15 g 0   naloxone (NARCAN) nasal spray 4 mg/0.1 mL Place in nostril in the event of suspected overdose on Narcotic medication (Oxycodone) 1 each 0   oxyCODONE (OXY IR/ROXICODONE) 5 MG immediate release tablet Take 5 mg by mouth 3 (three) times daily as needed for moderate pain.      oxymetazoline (12 HOUR DECONGESTANT) 0.05 % nasal spray Place 1 spray into both nostrils 2 (two) times daily. 30 mL 0   polyethylene glycol (MIRALAX / GLYCOLAX) 17 g packet Take 17 g by mouth daily as needed for moderate constipation or severe constipation. 14 each 0   potassium chloride SA (KLOR-CON) 20 MEQ tablet Take 1 and 1/2 tablets (30 mEq total) by mouth daily. 90 tablet 3   Secukinumab (COSENTYX SENSOREADY PEN) 150 MG/ML SOAJ INJECT ONE PEN SUBCUTANEOUSLY EVERY 4 WEEKS. REFRIGERATE. ALLOW 15 TO 30 MINUTES AT ROOM TEMP PRIOR TO ADMINISTRATION. 3 mL 1   Sodium Chloride-Sodium Bicarb (KETTLE NETI POT SINUS WASH) 2300-700 MG KIT Flush the sinuses twice a week.  Asked pharmacist to show you how to use. 1 kit 0   Tafamidis 61 MG CAPS TAKE 1 CAPSULE (  61 MG) BY MOUTH DAILY. 30 capsule 11   triamcinolone cream (KENALOG) 0.1 % APPLY TO THE AFFECTED AREA(S) TOPICALLY 2 TIMES DAILY AS DIRECTED 454 g 0   UNIFINE  PENTIPS 32G X 4 MM MISC USE AS DIRECTED DAILY 100 each 0   [DISCONTINUED] hydrALAZINE (APRESOLINE) 25 MG tablet Take 1 tablet (25 mg total) by mouth 3 (three) times daily. 90 tablet 3   [DISCONTINUED] isosorbide mononitrate (IMDUR) 30 MG 24 hr tablet Take 1 tablet (30 mg total) by mouth daily. 30 tablet 3   [DISCONTINUED] ramelteon (ROZEREM) 8 MG tablet TAKE 1 TABLET BY MOUTH AT BEDTIME 30 tablet 1   No current facility-administered medications on file prior to visit.    Allergies  Allergen Reactions   Nsaids Other (See Comments)    Stomach pains. Ulcers - stated by patient    Jardiance [Empagliflozin] Nausea And Vomiting    Family History  Problem Relation Age of Onset   Hypertension Mother    Diabetes Mother    COPD Mother    Lung cancer Mother        Smoker    Hypertension Father    Diabetes Father    Heart Problems Father    COPD Father    Colon cancer Neg Hx    Stomach cancer Neg Hx     BP 122/66   Pulse 77   Ht 5' 4" (1.626 m)   Wt 218 lb 6.4 oz (99.1 kg)   SpO2 95%   BMI 37.49 kg/m    Review of Systems He denies hypoglycemia.      Objective:   Physical Exam      Assessment & Plan:  Insulin-requiring type 2 DM: uncontrolled.  We discussed.  As it may improve further with time, we decided to continue the same dosage.    Patient Instructions  check your blood sugar twice a day.  vary the time of day when you check, between before the 3 meals, and at bedtime.  also check if you have symptoms of your blood sugar being too high or too low.  please keep a record of the readings and bring it to your next appointment here (or you can bring the meter itself).  You can write it on any piece of paper.  please call us sooner if your blood sugar goes below 70, or if most of your readings are over 200.   Please continue the same insulin. On this type of insulin schedule, you should eat meals on a regular schedule (especially lunch).  If a meal is missed or significantly  delayed, your blood sugar could go low.   Please come back for a follow-up appointment in 2 months.

## 2021-04-17 ENCOUNTER — Other Ambulatory Visit (HOSPITAL_COMMUNITY): Payer: Self-pay

## 2021-04-17 ENCOUNTER — Telehealth: Payer: Self-pay

## 2021-04-17 MED ORDER — METOPROLOL TARTRATE 100 MG PO TABS
100.0000 mg | ORAL_TABLET | Freq: Once | ORAL | 0 refills | Status: DC
  Filled 2021-04-17: qty 1, 1d supply, fill #0

## 2021-04-17 NOTE — Telephone Encounter (Signed)
Call placed to [employer] # [phone] regarding O2.  Spoke to [facility] who stated the provider's note from 04/05/2021 has been received with O2 saturations and it appears that he has been certified for O2 until 2026.  She said his account is clear and no additional information is needed at this time

## 2021-04-17 NOTE — Telephone Encounter (Signed)
Advised patient that [name] would like for him to repeat a coronary CTA. Patient verbalized understanding. Testing and instructions have been reviewed. Information will be sent through  [place] as well.

## 2021-04-17 NOTE — Telephone Encounter (Signed)
Spoke with the patient who states that his pain is not any worse then previously. He states that it is just a different feeling. He states that before it was more of a sharp pain. Now he describes it as a heaviness/tightness across his chest. He has not had any episodes over the past couple of days. States that it occurs every 2-3 days or so and lasts only a couple of minutes. He does become diaphoretic when episodes occur.

## 2021-04-17 NOTE — Telephone Encounter (Signed)
Left message for patient to call back  

## 2021-04-18 DIAGNOSIS — M25512 Pain in left shoulder: Secondary | ICD-10-CM | POA: Diagnosis not present

## 2021-04-18 DIAGNOSIS — M25511 Pain in right shoulder: Secondary | ICD-10-CM | POA: Diagnosis not present

## 2021-04-18 DIAGNOSIS — M79661 Pain in right lower leg: Secondary | ICD-10-CM | POA: Diagnosis not present

## 2021-04-18 DIAGNOSIS — M542 Cervicalgia: Secondary | ICD-10-CM | POA: Diagnosis not present

## 2021-04-18 DIAGNOSIS — M5412 Radiculopathy, cervical region: Secondary | ICD-10-CM | POA: Diagnosis not present

## 2021-04-18 DIAGNOSIS — M79662 Pain in left lower leg: Secondary | ICD-10-CM | POA: Diagnosis not present

## 2021-04-18 DIAGNOSIS — Z79891 Long term (current) use of opiate analgesic: Secondary | ICD-10-CM | POA: Diagnosis not present

## 2021-04-18 DIAGNOSIS — E1143 Type 2 diabetes mellitus with diabetic autonomic (poly)neuropathy: Secondary | ICD-10-CM | POA: Diagnosis not present

## 2021-04-18 DIAGNOSIS — M545 Low back pain, unspecified: Secondary | ICD-10-CM | POA: Diagnosis not present

## 2021-04-18 DIAGNOSIS — G894 Chronic pain syndrome: Secondary | ICD-10-CM | POA: Diagnosis not present

## 2021-04-19 ENCOUNTER — Telehealth (HOSPITAL_COMMUNITY): Payer: Self-pay | Admitting: Emergency Medicine

## 2021-04-19 ENCOUNTER — Other Ambulatory Visit (HOSPITAL_COMMUNITY): Payer: Self-pay

## 2021-04-19 DIAGNOSIS — L309 Dermatitis, unspecified: Secondary | ICD-10-CM | POA: Diagnosis not present

## 2021-04-19 NOTE — Telephone Encounter (Signed)
Attempted to call patient regarding upcoming cardiac CT appointment. °Left message on voicemail with name and callback number °Allenmichael Mcpartlin RN Navigator Cardiac Imaging °[facility] °[phone] Office °[phone] Cell ° °

## 2021-04-22 ENCOUNTER — Other Ambulatory Visit (HOSPITAL_COMMUNITY): Payer: Self-pay

## 2021-04-22 ENCOUNTER — Telehealth (HOSPITAL_COMMUNITY): Payer: Self-pay | Admitting: Emergency Medicine

## 2021-04-22 NOTE — Telephone Encounter (Signed)
Reaching out to patient to offer assistance regarding upcoming cardiac imaging study; pt verbalizes understanding of appt date/time, parking situation and where to check in, pre-test NPO status and medications ordered, and verified current allergies; name and call back number provided for further questions should they arise Marco Bond RN Navigator Cardiac Imaging [facility] [phone] office [phone] cell  Denies claustro Some iv difficulty '100mg'$  metop 2 hr prior

## 2021-04-23 ENCOUNTER — Other Ambulatory Visit: Payer: Self-pay

## 2021-04-23 ENCOUNTER — Encounter (HOSPITAL_COMMUNITY): Payer: Self-pay

## 2021-04-23 ENCOUNTER — Ambulatory Visit (HOSPITAL_COMMUNITY)
Admission: RE | Admit: 2021-04-23 | Discharge: 2021-04-23 | Disposition: A | Discharge status: Home / Self Care | Payer: Medicare Other | Source: Ambulatory Visit | Attending: Cardiology | Admitting: Cardiology

## 2021-04-23 ENCOUNTER — Other Ambulatory Visit (HOSPITAL_COMMUNITY): Payer: Self-pay

## 2021-04-23 DIAGNOSIS — I700 Atherosclerosis of aorta: Secondary | ICD-10-CM | POA: Diagnosis not present

## 2021-04-23 DIAGNOSIS — R072 Precordial pain: Secondary | ICD-10-CM | POA: Diagnosis not present

## 2021-04-23 MED ORDER — NITROGLYCERIN 0.4 MG SL SUBL
0.8000 mg | SUBLINGUAL_TABLET | Freq: Once | SUBLINGUAL | Status: AC
  Administered 2021-04-23: 0.8 mg via SUBLINGUAL

## 2021-04-23 MED ORDER — NITROGLYCERIN 0.4 MG SL SUBL
SUBLINGUAL_TABLET | SUBLINGUAL | Status: AC
  Filled 2021-04-23: qty 2

## 2021-04-23 MED ORDER — IOHEXOL 350 MG/ML SOLN
95.0000 mL | Freq: Once | INTRAVENOUS | Status: AC | PRN
  Administered 2021-04-23: 95 mL via INTRAVENOUS

## 2021-04-23 MED FILL — Tafamidis Cap 61 MG: ORAL | 30 days supply | Qty: 30 | Fill #5 | Status: AC

## 2021-04-24 ENCOUNTER — Other Ambulatory Visit (HOSPITAL_COMMUNITY): Payer: Self-pay

## 2021-04-24 ENCOUNTER — Encounter: Payer: Self-pay | Admitting: Endocrinology

## 2021-04-24 ENCOUNTER — Ambulatory Visit (INDEPENDENT_AMBULATORY_CARE_PROVIDER_SITE_OTHER): Payer: Medicare Other | Admitting: Podiatry

## 2021-04-24 DIAGNOSIS — M79675 Pain in left toe(s): Secondary | ICD-10-CM

## 2021-04-24 DIAGNOSIS — E1142 Type 2 diabetes mellitus with diabetic polyneuropathy: Secondary | ICD-10-CM

## 2021-04-24 DIAGNOSIS — M792 Neuralgia and neuritis, unspecified: Secondary | ICD-10-CM

## 2021-04-24 DIAGNOSIS — B351 Tinea unguium: Secondary | ICD-10-CM | POA: Diagnosis not present

## 2021-04-24 DIAGNOSIS — M79674 Pain in right toe(s): Secondary | ICD-10-CM

## 2021-04-25 ENCOUNTER — Telehealth: Payer: Self-pay | Admitting: Cardiology

## 2021-04-25 DIAGNOSIS — I2510 Atherosclerotic heart disease of native coronary artery without angina pectoris: Secondary | ICD-10-CM

## 2021-04-25 NOTE — Telephone Encounter (Signed)
Per [name], "Coronary CTA showed coronary calcium score of 535 which is 93rd % for age and sex matched controls.  There is mild CAD with mild calcified plaque in the distal LAD 25-49% and also in the ostial D3.  There is minimal calcified plaque in the mid LCx and mild mixed plaque in the pRCA with 25-49% stenosis. LDL at goal at 44 but TAGS elevated in June 2022.  Continue statin.  Come in for repeat fasting lipid. Please find out if patient is having CP still"  Called patient with his calcium score with his CT. Informed patient that he will need to repeat lipid panel. Patient stated he still has chest pain that comes and goes with rest or activity, and it has no pattern. He stated he could have up to 3 chest pain episodes in one day to none all week. Patient has office visit with [name] next week and will get fasting lipid panel the next day. Will forward to [name] and her nurse.

## 2021-04-25 NOTE — Telephone Encounter (Signed)
Pt returning phone call for ct results...[name] please advise.

## 2021-04-26 DIAGNOSIS — Z136 Encounter for screening for cardiovascular disorders: Secondary | ICD-10-CM | POA: Diagnosis not present

## 2021-04-26 DIAGNOSIS — L309 Dermatitis, unspecified: Secondary | ICD-10-CM | POA: Diagnosis not present

## 2021-04-26 NOTE — Telephone Encounter (Signed)
Kymon is returning [place] call.

## 2021-04-26 NOTE — Telephone Encounter (Signed)
Left message for patient to call back  

## 2021-04-26 NOTE — Progress Notes (Signed)
  Subjective:  Patient ID: Marco [name], male    DOB: 1956-07-31,  MRN: [id number]  Chief Complaint  Patient presents with   Nail Problem    Nail trim    65 y.o. male returns for the above complaint.  Patient presents with thickened elongated dystrophic toenails x10.  Patient states he would like to have them debrided and he is not able to do it himself there is pain on palpation pain with ambulation.  He has secondary complaint of neuropathic pain to both lower extremity.  He would like to discuss treatment options for it.  He has been just started on gabapentin.  Objective:  There were no vitals filed for this visit. Podiatric Exam: Vascular: dorsalis pedis and posterior tibial pulses are palpable bilateral. Capillary return is immediate. Temperature gradient is WNL. Skin turgor WNL  Sensorium: Decreased Semmes Weinstein monofilament test.  Decreased tactile sensation bilaterally. Nail Exam: Pt has thick disfigured discolored nails with subungual debris noted bilateral entire nail hallux through fifth toenails.  Pain on palpation to the nails. Ulcer Exam: There is no evidence of ulcer or pre-ulcerative changes or infection. Orthopedic Exam: Muscle tone and strength are WNL. No limitations in general ROM. No crepitus or effusions noted. HAV  B/L.  Hammer toes 2-5  B/L. Skin: No Porokeratosis. No infection or ulcers    Assessment & Plan:   1. Neuropathic pain   2. Type 2 diabetes mellitus with polyneuropathy (HCC)   3. Pain due to onychomycosis of toenails of both feet      Patient was evaluated and treated and all questions answered.  Neuropathic pain -I explained to patient the etiology of neuropathic pain in setting of diabetes and various treatment options were discussed.  At this time I discussed with the patient the possible treatment options for which includes gabapentin or Lyrica.  He has just recently been started on gabapentin I encouraged him to give it some time to see  if there is any improvement if there is no resolve meant he can discuss with his primary care physician to try Lyrica.  Patient states understanding.  Onychomycosis with pain  -Nails palliatively debrided as below. -Educated on self-care  Procedure: Nail Debridement Rationale: pain  Type of Debridement: manual, sharp debridement. Instrumentation: Nail nipper, rotary burr. Number of Nails: 10  Procedures and Treatment: Consent by patient was obtained for treatment procedures. The patient understood the discussion of treatment and procedures well. All questions were answered thoroughly reviewed. Debridement of mycotic and hypertrophic toenails, 1 through 5 bilateral and clearing of subungual debris. No ulceration, no infection noted.  Return Visit-Office Procedure: Patient instructed to return to the office for a follow up visit 3 months for continued evaluation and treatment.  Boneta Lucks, DPM    No follow-ups on file.

## 2021-04-26 NOTE — Telephone Encounter (Signed)
Patient has not been keeping up with his BP. He will start to take it regularly and record his readings. He is seeing [name] on Monday.

## 2021-04-29 ENCOUNTER — Other Ambulatory Visit (HOSPITAL_COMMUNITY): Payer: Self-pay

## 2021-04-29 ENCOUNTER — Ambulatory Visit (INDEPENDENT_AMBULATORY_CARE_PROVIDER_SITE_OTHER): Payer: Medicare Other | Admitting: Cardiology

## 2021-04-29 ENCOUNTER — Other Ambulatory Visit: Payer: Self-pay

## 2021-04-29 ENCOUNTER — Encounter: Payer: Self-pay | Admitting: Cardiology

## 2021-04-29 VITALS — BP 106/58 | HR 71 | Ht 66.0 in | Wt 218.8 lb

## 2021-04-29 DIAGNOSIS — I2510 Atherosclerotic heart disease of native coronary artery without angina pectoris: Secondary | ICD-10-CM | POA: Diagnosis not present

## 2021-04-29 DIAGNOSIS — R072 Precordial pain: Secondary | ICD-10-CM

## 2021-04-29 DIAGNOSIS — I10 Essential (primary) hypertension: Secondary | ICD-10-CM

## 2021-04-29 DIAGNOSIS — E7800 Pure hypercholesterolemia, unspecified: Secondary | ICD-10-CM | POA: Diagnosis not present

## 2021-04-29 DIAGNOSIS — G4733 Obstructive sleep apnea (adult) (pediatric): Secondary | ICD-10-CM

## 2021-04-29 DIAGNOSIS — I480 Paroxysmal atrial fibrillation: Secondary | ICD-10-CM | POA: Diagnosis not present

## 2021-04-29 MED ORDER — ISOSORBIDE MONONITRATE ER 60 MG PO TB24
90.0000 mg | ORAL_TABLET | Freq: Every day | ORAL | 3 refills | Status: AC
  Filled 2021-04-29: qty 135, 90d supply, fill #0
  Filled 2021-07-29: qty 135, 90d supply, fill #1

## 2021-04-29 NOTE — Patient Instructions (Signed)
Medication Instructions:  Your physician has recommended you make the following change in your medication:  1) INCREASE Imdur (isosorbide) to 90 mg daily   *If you need a refill on your cardiac medications before your next appointment, please call your pharmacy*  Follow-Up: At [facility], you and your health needs are our priority.  As part of our continuing mission to provide you with exceptional heart care, we have created designated Provider Care Teams.  These Care Teams include your primary Cardiologist (physician) and Advanced Practice Providers (APPs -  Physician Assistants and Nurse Practitioners) who all work together to provide you with the care you need, when you need it.   Your next appointment:   4 week(s)  The format for your next appointment:   In Person  Provider:   You will see one of the following Advanced Practice Providers on your designated Care Team:   [name], [name] Ermalinda Barrios, [name]  Then, [name], MD will plan to see you again in 6 month(s).

## 2021-04-29 NOTE — Progress Notes (Addendum)
Date:  04/29/2021   ID:  Marco [name], DOB 08-10-1956, MRN [id number]   PCP:  [name], MD  Cardiologist:  [name], MD  Electrophysiologist:  None   Chief Complaint:  OSA, Afib, CHF, HTN, HLD  History of Present Illness:    Marco [name] is a 65 y.o. male with a hx of combined systolic and diastolic heart failure, nonischemic cardiomyopathy, mild nonobstructive coronary artery disease by cardiac catheterization in 2017, persistent atrial fibrillation with prior history of ablation in [place] in 2017, diabetes, hypertension, chronic kidney disease, sleep apnea, obesity and OSA on CPAP.    His EF was 45-50 but improved to normal by Echo in 01/2017. CHADS2-VASc=4 (CHF, DM, CAD, HTN).  Anticoagulation for PAF has been discontinued in the past secondary to anemia.  He has a history of chronic dyspnea and chronic chest discomfort.  Evaluation by pulmonology included a cardiopulmonary stress test in 2017.  However, this was not conclusive due to submaximal effort.    He was seen in August 2019 for worsening shortness of breath.  An echocardiogram in September 2019 demonstrated worsening LV function with an EF of 35-40%.     Coronary CTA was planned however, the patient was admitted in October 2019 with chest pain and mildly elevated troponin.  Echocardiogram demonstrated EF 25-30%.  Cardiac catheterization demonstrated mild plaque without obstructive disease.  CHF medications were adjusted for nonischemic cardiomyopathy.    He was evaluated by [name] in the advanced heart failure clinic in October 2019.  PYP scan was strongly suggestive of transthyretin amyloidosis (grade 2, H/CLL equal 1.94) .  After discussion with [name] with Hematology he felt he did not have myeloma.  Genetic testing negative and myeloma panel with M[facility] but no myeloma.  Now on Tafamadis and followed by AHF clinic. His Entresto, Hydralazine and imdur due to soft BP.    He is here today for  followup.  He has been having chest pain lately that is off and on and very sporadic.  He describes it as tightness when at rest sitting watching TV and not with exertion.  He will get hot and diaphoretic and then his chest will hurt.  He has chronic SOB that remains stable with no change.  He denies any PND, orthopnea, LE edema, dizziness, palpitations or syncope. He is compliant with his meds and is tolerating meds with no SE.     He continues to use his PAP at night.  He is having a lot of problems with mouth dryness and nose dryness.  He uses a full face mask and feels the pressure is adequate.  He tried to increase the humidity but said it was getting too cold.  Since going on PAP he feels rested in the am but still gets sleepy during the day and naps.  He goes to bed at 1am and sleeps until 11am.  He gets up twice to go to the bathroom.     Prior CV studies:   The following studies were reviewed today:  Lab, PAP compliance download  Past Medical History:  Diagnosis Date   Benign colon polyp 08/01/2013   [facility] in [place]. large base tranverse colon polyp was biopsied. polyp was benign with minimal surface hyperplastic change.   Chronic combined systolic and diastolic CHF (congestive heart failure) (HCC)    Chronic pain    CKD (chronic kidney disease), stage III (HCC)    Diabetes mellitus without complication ([place])  Diverticulosis    Esophageal hiatal hernia 07/29/2013   confirmed on EGD    Esophageal stricture    Essential hypertension    Gastritis 07/29/2013   confirmed on EGD, bx done an negative for intestinal metaplasia, dsyplasia or H. pylori. normal gastric emptying study done 07/13/2013.   GERD (gastroesophageal reflux disease)    Morbid obesity (HCC)    Non-obstructive CAD    Coronary CTA 04/2021 showed coronary calcium score of 535 which is 93rd % for age and sex matched controls.  There is mild CAD with mild calcified plaque in the distal LAD 25-49% and  also in the ostial D3.  There is minimal calcified plaque in the mid LCx and mild mixed plaque in the pRCA with 25-49% stenosis.   Persistent atrial fibrillation ([place])    a. 02/2013 s/p rfca in [place], [place]-->prev on Xarelto, d/c'd 2/2 anemia, ? GIB.   Rheumatoid arthritis ([place])    Sigmoid diverticulosis 08/01/2013   confirmed on colonscopy. record scanned into chart   Past Surgical History:  Procedure Laterality Date   CARDIAC CATHETERIZATION  05/2014   ablation for atrial fibrillation   CARDIAC CATHETERIZATION N/A 11/16/2015   Procedure: Left Heart Cath and Coronary Angiography;  Surgeon: Leonie Man, MD;  Location: [place];  Service: Cardiovascular;  Laterality: N/A;   COLON RESECTION  09/2013   due to large, abnormal polpy. non cancerous per patient.    COLON SURGERY  09/2014   colon resection    EXTRACORPOREAL SHOCK WAVE LITHOTRIPSY Left 01/03/2021   Procedure: EXTRACORPOREAL SHOCK WAVE LITHOTRIPSY (ESWL);  Surgeon: Robley Fries, MD;  Location: [facility];  Service: Urology;  Laterality: Left;   IR PERC PLEURAL DRAIN W/INDWELL CATH W/IMG GUIDE  01/07/2020   LEFT HEART CATH AND CORONARY ANGIOGRAPHY N/A 05/19/2018   Procedure: LEFT HEART CATH AND CORONARY ANGIOGRAPHY;  Surgeon: Wellington Hampshire, MD;  Location: [place];  Service: Cardiovascular;  Laterality: N/A;     Current Meds  Medication Sig   Accu-Chek Softclix Lancets lancets USE AS INSTRUCTED   acitretin (SORIATANE) 25 MG capsule Take 2 capsules by mouth with food daily for 2 weeks then take 1 capsule daily   albuterol (VENTOLIN HFA) 108 (90 Base) MCG/ACT inhaler INHALE 2 PUFFS INTO THE LUNGS EVERY 6 HOURS AS NEEDED FOR WHEEZING OR SHORTNESS OF BREATH.   allopurinol (ZYLOPRIM) 100 MG tablet Take 2 tablets by mouth daily   amLODipine (NORVASC) 5 MG tablet Take 1 tablet (5 mg total) by mouth daily.   aspirin (EQ ASPIRIN ADULT LOW DOSE) 81 MG EC tablet Take 1 tablet (81 mg total) by  mouth daily. TAKE 1 TABLET BY MOUTH ONCE DAILY. SWALLOW WHOLE Please hold asa until you repeat blood work, resume if blood count stable and ok with you primary care doctor   atorvastatin (LIPITOR) 40 MG tablet Take 1 tablet (40 mg total) by mouth daily.   carvedilol (COREG) 12.5 MG tablet TAKE 1 TABLET BY MOUTH TWO TIMES DAILY WITH MEALS   clobetasol cream (TEMOVATE) 0.05 % Apply to affected area twice a day   Continuous Blood Gluc Receiver (DEXCOM G6 RECEIVER) DEVI Use as instructed to monitor blood sugar daily   Continuous Blood Gluc Sensor (DEXCOM G6 SENSOR) MISC Use as instructed to monitor blood sugar daily. Change every 10 days.   Continuous Blood Gluc Transmit (DEXCOM G6 TRANSMITTER) MISC Use as instructed to monitor blood sugar daily. Change every 90 days.   COSENTYX  SENSOREADY PEN 150 MG/ML SOAJ SMARTSIG:1 Pre-Filled Pen Syringe SUB-Q Every 4 Weeks   dextromethorphan-guaiFENesin (MUCINEX DM) 30-600 MG 12hr tablet Take 1 tablet by mouth 2 (two) times daily as needed for cough.   diclofenac Sodium (VOLTAREN) 1 % GEL Apply 4 g topically 4 (four) times daily. (Patient taking differently: Apply 4 g topically 4 (four) times daily as needed (pain).)   DULoxetine (CYMBALTA) 20 MG capsule TAKE 1 CAPSULE BY MOUTH ONCE A DAY   famotidine (PEPCID) 20 MG tablet TAKE 1 TABLET BY MOUTH 2 TIMES DAILY   fexofenadine (ALLEGRA) 180 MG tablet Take 180 mg by mouth daily.   fluocinonide cream (LIDEX) 0.05 % Apply 1 (one) application to affected area twice a day   fluticasone (FLONASE) 50 MCG/ACT nasal spray PLACE 1 SPRAY INTO BOTH NOSTRILS DAILY AS NEEDED FOR ALLERGIES OR RHINITIS.   furosemide (LASIX) 40 MG tablet Take 1 tablet (40 mg total) by mouth every evening.   furosemide (LASIX) 80 MG tablet Take 1 tablet (80 mg total) by mouth every morning.   gabapentin (NEURONTIN) 300 MG capsule Take 1 capsule (300 mg total) by mouth at bedtime.   glucose blood test strip USE TO TEST 4 TIMES DAILY. (Patient taking  differently: USE TO TEST 4 TIMES DAILY.)   Insulin Lispro Prot & Lispro (HUMALOG MIX 50/50 KWIKPEN) (50-50) 100 UNIT/ML Kwikpen Inject 95 Units into the skin daily with breakfast.   Insulin Pen Needle (UNIFINE PENTIPS) 32G X 4 MM MISC USE AS DIRECTED DAILY   metoprolol tartrate (LOPRESSOR) 100 MG tablet Take 1 tablet (100 mg total) by mouth once for 1 dose.   mupirocin cream (BACTROBAN) 2 % Apply 1 application topically 2 (two) times daily.   naloxone (NARCAN) nasal spray 4 mg/0.1 mL Place in nostril in the event of suspected overdose on Narcotic medication (Oxycodone)   oxyCODONE (OXY IR/ROXICODONE) 5 MG immediate release tablet Take 5 mg by mouth 3 (three) times daily as needed for moderate pain.    oxymetazoline (12 HOUR DECONGESTANT) 0.05 % nasal spray Place 1 spray into both nostrils 2 (two) times daily.   polyethylene glycol (MIRALAX / GLYCOLAX) 17 g packet Take 17 g by mouth daily as needed for moderate constipation or severe constipation.   potassium chloride SA (KLOR-CON) 20 MEQ tablet Take 1 and 1/2 tablets (30 mEq total) by mouth daily.   Secukinumab (COSENTYX SENSOREADY PEN) 150 MG/ML SOAJ INJECT ONE PEN SUBCUTANEOUSLY EVERY 4 WEEKS. REFRIGERATE. ALLOW 15 TO 30 MINUTES AT ROOM TEMP PRIOR TO ADMINISTRATION.   Sodium Chloride-Sodium Bicarb (KETTLE NETI POT SINUS WASH) 2300-700 MG KIT Flush the sinuses twice a week.  Asked pharmacist to show you how to use. (Patient taking differently: Flush the sinuses twice a week.  Asked pharmacist to show you how to use.)   Tafamidis 61 MG CAPS TAKE 1 CAPSULE (61 MG) BY MOUTH DAILY.   triamcinolone cream (KENALOG) 0.1 % APPLY TO THE AFFECTED AREA(S) TOPICALLY 2 TIMES DAILY AS DIRECTED   UNIFINE PENTIPS 32G X 4 MM MISC USE AS DIRECTED DAILY   [DISCONTINUED] hydrALAZINE (APRESOLINE) 25 MG tablet Take 1 tablet (25 mg total) by mouth 3 (three) times daily.   [DISCONTINUED] isosorbide mononitrate (IMDUR) 30 MG 24 hr tablet Take 1 tablet (30 mg total) by mouth  daily.     Allergies:   Nsaids and Jardiance [empagliflozin]   Social History   Tobacco Use   Smoking status: Former    Packs/day: 0.50    Years: 10.00  Pack years: 5.00    Types: Cigarettes    Quit date: 04/11/2008    Years since quitting: 13.0   Smokeless tobacco: Never  Vaping Use   Vaping Use: Never used  Substance Use Topics   Alcohol use: No    Alcohol/week: 0.0 standard drinks   Drug use: No     Family Hx: The patient's family history includes COPD in his father and mother; Diabetes in his father and mother; Heart Problems in his father; Hypertension in his father and mother; Lung cancer in his mother. There is no history of Colon cancer or Stomach cancer.  ROS:   Please see the history of present illness.     All other systems reviewed and are negative.   Labs/Other Tests and Data Reviewed:    Recent Labs: 05/24/2020: Magnesium 1.8 08/21/2020: B Natriuretic Peptide 106.4 11/06/2020: Hemoglobin 12.6; Platelets 257 01/11/2021: ALT 8 04/11/2021: BUN 11; Creatinine, Ser 1.39; Potassium 4.0; Sodium 141; TSH 1.720   Recent Lipid Panel Lab Results  Component Value Date/Time   CHOL 97 (L) 01/11/2021 11:04 AM   TRIG 172 (H) 01/11/2021 11:04 AM   HDL 27 (L) 01/11/2021 11:04 AM   CHOLHDL 3.6 01/11/2021 11:04 AM   CHOLHDL 2.6 06/10/2016 12:53 PM   LDLCALC 41 01/11/2021 11:04 AM    Wt Readings from Last 3 Encounters:  04/29/21 218 lb 12.8 oz (99.2 kg)  04/16/21 218 lb 6.4 oz (99.1 kg)  04/11/21 220 lb (99.8 kg)     Objective:    Vital Signs:  BP (!) 106/58   Pulse 71   Ht 5\' 6"  (1.676 m)   Wt 218 lb 12.8 oz (99.2 kg)   SpO2 96%   BMI 35.32 kg/m    GEN: Well nourished, well developed in no acute distress HEENT: Normal NECK: No JVD; No carotid bruits LYMPHATICS: No lymphadenopathy CARDIAC:RRR, no murmurs, rubs, gallops RESPIRATORY:  Clear to auscultation without rales, wheezing or rhonchi  ABDOMEN: Soft, non-tender, non-distended MUSCULOSKELETAL:  No  edema; No deformity  SKIN: Warm and dry NEUROLOGIC:  Alert and oriented x 3 PSYCHIATRIC:  Normal affect   ASSESSMENT & PLAN:    1.  OSA - The patient is tolerating PAP therapy. The patient has been using and benefiting from PAP use and will continue to benefit from therapy.  -I will get a download from the DME -set up appt with DME to discuss how to regulate humidity in his mask and tubing to help prevent dry mouth -start using nasal saline spray 2 sprays BID each nostril to help with dry nose   2.  HTN -BP is adequately controlled on exam today -Continue prescription drug management with Carvedilol 12.5mg  BID,  Lasix 80mg  daily, Hydralazine 25mg  TID, amlodipine 5mg  daily>refilled  -I have personally reviewed and interpreted outside labs performed by patient's PCP which showed SCR 1.39 and K+ 4   3. ASCAD -mild non obstructive dz on cath 2017 -he continues to have chronic chest pain more at rest than with exertion and coronary CTA recently showing non obstructive CAD -his sx only occur at night or when sitting watching TV so >? If this is related to GERD -he has more prominent T wave changes in anterior leads on EKG than he has had in the past -I will get a Stress myoview to rule out ischemia -Shared Decision Making/Informed Consent The risks [chest pain, shortness of breath, cardiac arrhythmias, dizziness, blood pressure fluctuations, myocardial infarction, stroke/transient ischemic attack, nausea, vomiting, allergic reaction, radiation  exposure, metallic taste sensation and life-threatening complications (estimated to be 1 in 10,000)], benefits (risk stratification, diagnosing coronary artery disease, treatment guidance) and alternatives of a nuclear stress test were discussed in detail with Mr. Fowle and he agrees to proceed. -Continue prescription drug management with ASA, Carvedilol 12.5mg  BID and statin -increase Imdur to 60mg  daily although I am not convinced that his CP is cardiac  related  4.  PAF -s/p RFCA 2017 -he continues to maintain NSR on exam -previously on Xarelto but stopped due to anemia and unknown blood loss  8.  HLD -LDL goal < 70 -I have personally reviewed and interpreted labs including  LDL 41, HDL 27, TAG 172, AT 8 in June 2022  -Continue prescription drug management with Atorvastatin 40mg  daily>refilled   Followup with PA in 4 weeks and TT in 6 months  Medication Adjustments/Labs and Tests Ordered: Current medicines are reviewed at length with the patient today.  Concerns regarding medicines are outlined above.  Tests Ordered: Orders Placed This Encounter  Procedures   EKG 12-Lead    Medication Changes: No orders of the defined types were placed in this encounter.   Disposition:  Followup 1 year  Signed, [name], MD  04/29/2021 1:53 PM    [place]

## 2021-04-29 NOTE — Addendum Note (Signed)
Addended by: Antonieta Iba on: 04/29/2021 03:36 PM   Modules accepted: Orders

## 2021-04-29 NOTE — Addendum Note (Signed)
Addended by: Antonieta Iba on: 04/29/2021 04:26 PM   Modules accepted: Orders

## 2021-04-29 NOTE — Addendum Note (Signed)
Addended by: Antonieta Iba on: 04/29/2021 02:30 PM   Modules accepted: Orders

## 2021-04-30 ENCOUNTER — Other Ambulatory Visit (HOSPITAL_COMMUNITY): Payer: Self-pay

## 2021-04-30 ENCOUNTER — Other Ambulatory Visit: Payer: Medicare Other

## 2021-05-02 ENCOUNTER — Telehealth (HOSPITAL_COMMUNITY): Payer: Self-pay | Admitting: *Deleted

## 2021-05-02 NOTE — Telephone Encounter (Signed)
Left message on voicemail per DPR in reference to upcoming appointment scheduled on 05/06/2021 at 10:45 with detailed instructions given per Myocardial Perfusion Study Information Sheet for the test. LM to arrive 15 minutes early, and that it is imperative to arrive on time for appointment to keep from having the test rescheduled. If you need to cancel or reschedule your appointment, please call the office within 24 hours of your appointment. Failure to do so may result in a cancellation of your appointment, and a $50 no show fee. Phone number given for call back for any questions.

## 2021-05-03 ENCOUNTER — Telehealth: Payer: Self-pay | Admitting: Internal Medicine

## 2021-05-03 NOTE — Telephone Encounter (Signed)
Referral Request - Has patient seen PCP for this complaint? yes *If NO, is insurance requiring patient see PCP for this issue before PCP can refer them? Referral for which specialty: colonoscopy Preferred provider/office: N\A Reason for referral: routine colonoscopy

## 2021-05-06 ENCOUNTER — Other Ambulatory Visit (HOSPITAL_COMMUNITY): Payer: Self-pay

## 2021-05-06 ENCOUNTER — Other Ambulatory Visit: Payer: Self-pay | Admitting: Internal Medicine

## 2021-05-06 ENCOUNTER — Encounter (HOSPITAL_COMMUNITY): Payer: Medicare Other

## 2021-05-06 ENCOUNTER — Other Ambulatory Visit: Payer: Self-pay | Admitting: Nurse Practitioner

## 2021-05-06 ENCOUNTER — Telehealth: Payer: Self-pay | Admitting: Cardiology

## 2021-05-06 DIAGNOSIS — Z1211 Encounter for screening for malignant neoplasm of colon: Secondary | ICD-10-CM

## 2021-05-06 NOTE — Telephone Encounter (Signed)
Requested medications are due for refill today NO  Requested medications are on the active medication list Dose inconsistent  Last refill 04/08/21  Last visit 04/11/21  Future visit scheduled 08/13/21  Notes to clinic Dose inconsistent with current med list, please assess.

## 2021-05-06 NOTE — Telephone Encounter (Signed)
Pt given clarification on his meds. He thought Imdur and Vyndamax were the same medications and wanted to be sure he was not taking any duplicate meds.

## 2021-05-06 NOTE — Telephone Encounter (Signed)
Will forward to covering provider to place referral

## 2021-05-06 NOTE — Telephone Encounter (Signed)
Pt c/o medication issue:  1. Name of Medication:  Isosorbide 90 MG   2. How are you currently taking this medication (dosage and times per day)?    3. Are you having a reaction (difficulty breathing--STAT)?   4. What is your medication issue?   Patient is requesting clarification on which medication he should be taking. He would like to know if he needs to completely discontinue Vynndamax.

## 2021-05-07 ENCOUNTER — Other Ambulatory Visit (HOSPITAL_COMMUNITY): Payer: Self-pay

## 2021-05-07 ENCOUNTER — Other Ambulatory Visit: Payer: Self-pay

## 2021-05-07 DIAGNOSIS — R079 Chest pain, unspecified: Secondary | ICD-10-CM

## 2021-05-07 NOTE — Addendum Note (Signed)
Addended by: [name] R on: 05/07/2021 10:29 AM   Modules accepted: Orders

## 2021-05-07 NOTE — Progress Notes (Signed)
Shared Decision Making/Informed Consent The risks [chest pain, shortness of breath, cardiac arrhythmias, dizziness, blood pressure fluctuations, myocardial infarction, stroke/transient ischemic attack, nausea, vomiting, allergic reaction, radiation exposure, metallic taste sensation and life-threatening complications (estimated to be 1 in 10,000)], benefits (risk stratification, diagnosing coronary artery disease, treatment guidance) and alternatives of a nuclear stress test were discussed in detail with [name] and he agrees to proceed.

## 2021-05-09 ENCOUNTER — Other Ambulatory Visit (HOSPITAL_COMMUNITY): Payer: Self-pay

## 2021-05-09 MED ORDER — FAMOTIDINE 20 MG PO TABS
ORAL_TABLET | Freq: Two times a day (BID) | ORAL | 2 refills | Status: DC
  Filled 2021-05-09: qty 60, 30d supply, fill #0
  Filled 2021-06-06: qty 60, 30d supply, fill #1
  Filled 2021-07-01: qty 60, 30d supply, fill #2

## 2021-05-10 DIAGNOSIS — L309 Dermatitis, unspecified: Secondary | ICD-10-CM | POA: Diagnosis not present

## 2021-05-10 DIAGNOSIS — G4700 Insomnia, unspecified: Secondary | ICD-10-CM | POA: Diagnosis not present

## 2021-05-10 DIAGNOSIS — Z136 Encounter for screening for cardiovascular disorders: Secondary | ICD-10-CM | POA: Diagnosis not present

## 2021-05-10 DIAGNOSIS — R269 Unspecified abnormalities of gait and mobility: Secondary | ICD-10-CM | POA: Diagnosis not present

## 2021-05-10 DIAGNOSIS — R2681 Unsteadiness on feet: Secondary | ICD-10-CM | POA: Diagnosis not present

## 2021-05-10 DIAGNOSIS — G4733 Obstructive sleep apnea (adult) (pediatric): Secondary | ICD-10-CM | POA: Diagnosis not present

## 2021-05-13 ENCOUNTER — Telehealth (HOSPITAL_COMMUNITY): Payer: Self-pay

## 2021-05-13 NOTE — Telephone Encounter (Signed)
Tailed instructions left on the patient's answering machine. Asked to call back with any questions. S.Carrie Schoonmaker EMTP

## 2021-05-14 ENCOUNTER — Other Ambulatory Visit: Payer: Self-pay

## 2021-05-14 ENCOUNTER — Ambulatory Visit (HOSPITAL_COMMUNITY): Payer: Medicare Other | Attending: Internal Medicine

## 2021-05-14 DIAGNOSIS — R072 Precordial pain: Secondary | ICD-10-CM

## 2021-05-14 LAB — MYOCARDIAL PERFUSION IMAGING
Estimated workload: 3.7
Exercise duration (sec): 50 s
LV dias vol: 136 mL (ref 62–150)
LV sys vol: 88 mL
MPHR: 154 {beats}/min
Nuc Stress EF: 36 %
Peak HR: 93 {beats}/min
Percent HR: 60 %
RPE: 20
Rest HR: 81 {beats}/min
Rest Nuclear Isotope Dose: 10.3 mCi
SDS: 3
SRS: 5
SSS: 8
ST Depression (mm): 0 mm
Stress Nuclear Isotope Dose: 30.6 mCi
TID: 0.93

## 2021-05-14 MED ORDER — TECHNETIUM TC 99M TETROFOSMIN IV KIT
10.3000 | PACK | Freq: Once | INTRAVENOUS | Status: AC | PRN
  Administered 2021-05-14: 10.3 via INTRAVENOUS
  Filled 2021-05-14: qty 11

## 2021-05-14 MED ORDER — TECHNETIUM TC 99M TETROFOSMIN IV KIT
30.6000 | PACK | Freq: Once | INTRAVENOUS | Status: AC | PRN
  Administered 2021-05-14: 30.6 via INTRAVENOUS
  Filled 2021-05-14: qty 31

## 2021-05-14 MED ORDER — REGADENOSON 0.4 MG/5ML IV SOLN
0.4000 mg | Freq: Once | INTRAVENOUS | Status: AC
  Administered 2021-05-14: 0.4 mg via INTRAVENOUS

## 2021-05-15 DIAGNOSIS — J9601 Acute respiratory failure with hypoxia: Secondary | ICD-10-CM | POA: Diagnosis not present

## 2021-05-16 DIAGNOSIS — M79662 Pain in left lower leg: Secondary | ICD-10-CM | POA: Diagnosis not present

## 2021-05-16 DIAGNOSIS — Z79891 Long term (current) use of opiate analgesic: Secondary | ICD-10-CM | POA: Diagnosis not present

## 2021-05-16 DIAGNOSIS — M542 Cervicalgia: Secondary | ICD-10-CM | POA: Diagnosis not present

## 2021-05-16 DIAGNOSIS — M5136 Other intervertebral disc degeneration, lumbar region: Secondary | ICD-10-CM | POA: Diagnosis not present

## 2021-05-16 DIAGNOSIS — M545 Low back pain, unspecified: Secondary | ICD-10-CM | POA: Diagnosis not present

## 2021-05-16 DIAGNOSIS — M25512 Pain in left shoulder: Secondary | ICD-10-CM | POA: Diagnosis not present

## 2021-05-16 DIAGNOSIS — M25511 Pain in right shoulder: Secondary | ICD-10-CM | POA: Diagnosis not present

## 2021-05-16 DIAGNOSIS — E1143 Type 2 diabetes mellitus with diabetic autonomic (poly)neuropathy: Secondary | ICD-10-CM | POA: Diagnosis not present

## 2021-05-16 DIAGNOSIS — G894 Chronic pain syndrome: Secondary | ICD-10-CM | POA: Diagnosis not present

## 2021-05-16 DIAGNOSIS — M79661 Pain in right lower leg: Secondary | ICD-10-CM | POA: Diagnosis not present

## 2021-05-16 DIAGNOSIS — G890 Central pain syndrome: Secondary | ICD-10-CM | POA: Diagnosis not present

## 2021-05-17 ENCOUNTER — Other Ambulatory Visit (HOSPITAL_COMMUNITY): Payer: Self-pay

## 2021-05-17 MED FILL — Albuterol Sulfate Inhal Aero 108 MCG/ACT (90MCG Base Equiv): RESPIRATORY_TRACT | 25 days supply | Qty: 8.5 | Fill #0 | Status: AC

## 2021-05-21 ENCOUNTER — Other Ambulatory Visit (HOSPITAL_COMMUNITY): Payer: Self-pay

## 2021-05-21 DIAGNOSIS — Z794 Long term (current) use of insulin: Secondary | ICD-10-CM | POA: Diagnosis not present

## 2021-05-21 DIAGNOSIS — E119 Type 2 diabetes mellitus without complications: Secondary | ICD-10-CM | POA: Diagnosis not present

## 2021-05-23 ENCOUNTER — Other Ambulatory Visit (HOSPITAL_COMMUNITY): Payer: Self-pay

## 2021-05-23 ENCOUNTER — Ambulatory Visit (INDEPENDENT_AMBULATORY_CARE_PROVIDER_SITE_OTHER): Payer: Medicare Other | Admitting: Endocrinology

## 2021-05-23 ENCOUNTER — Encounter: Payer: Self-pay | Admitting: Endocrinology

## 2021-05-23 ENCOUNTER — Other Ambulatory Visit: Payer: Self-pay

## 2021-05-23 VITALS — BP 100/50 | HR 71 | Ht 66.0 in | Wt 221.2 lb

## 2021-05-23 DIAGNOSIS — N183 Chronic kidney disease, stage 3 unspecified: Secondary | ICD-10-CM | POA: Diagnosis not present

## 2021-05-23 DIAGNOSIS — E1122 Type 2 diabetes mellitus with diabetic chronic kidney disease: Secondary | ICD-10-CM | POA: Diagnosis not present

## 2021-05-23 MED ORDER — TIRZEPATIDE 2.5 MG/0.5ML ~~LOC~~ SOAJ
2.5000 mg | SUBCUTANEOUS | 3 refills | Status: DC
  Filled 2021-05-23: qty 2, 28d supply, fill #0
  Filled 2021-07-01: qty 2, 28d supply, fill #1

## 2021-05-23 NOTE — Progress Notes (Signed)
Subjective:    Patient ID: Marco [name], male    DOB: September 01, 1955, 65 y.o.   MRN: [id number]  HPI Pt returns for f/u of diabetes mellitus: DM type: Insulin-requiring type 2 Dx'ed: 8338 Complications: PN, CAD, NPDR, and stage 3a CRI.  Therapy: insulin since soon after dx DKA: never Severe hypoglycemia: once (2020) Pancreatitis: never.  Pancreatic imaging: never.  Other: he intermittently takes prednisone, for psoriasis; he declined to continue multiple daily injections; due to renal failure (and fasting hypoglycemia), he takes 50/50 insulin; he declines GLP med. Interval history: I reviewed continuous glucose monitor data.  glucose varies from 170-400.  It is lowest 8AM-11AM.  It increases until 2PM.  It is then flat until 3AM, then decreases.  No recent steroids.  pt says he never misses the insulin.   Past Medical History:  Diagnosis Date   Benign colon polyp 08/01/2013   [facility] in [place]. large base tranverse colon polyp was biopsied. polyp was benign with minimal surface hyperplastic change.   Chronic combined systolic and diastolic CHF (congestive heart failure) (HCC)    Chronic pain    CKD (chronic kidney disease), stage III (HCC)    Diabetes mellitus without complication ([place])    Diverticulosis    Esophageal hiatal hernia 07/29/2013   confirmed on EGD    Esophageal stricture    Essential hypertension    Gastritis 07/29/2013   confirmed on EGD, bx done an negative for intestinal metaplasia, dsyplasia or H. pylori. normal gastric emptying study done 07/13/2013.   GERD (gastroesophageal reflux disease)    Morbid obesity (HCC)    Non-obstructive CAD    Coronary CTA 04/2021 showed coronary calcium score of 535 which is 93rd % for age and sex matched controls.  There is mild CAD with mild calcified plaque in the distal LAD 25-49% and also in the ostial D3.  There is minimal calcified plaque in the mid LCx and mild mixed plaque in the pRCA with 25-49% stenosis.    Persistent atrial fibrillation ([place])    a. 02/2013 s/p rfca in [place], [place]-->prev on Xarelto, d/c'd 2/2 anemia, ? GIB.   Rheumatoid arthritis ([place])    Sigmoid diverticulosis 08/01/2013   confirmed on colonscopy. record scanned into chart    Past Surgical History:  Procedure Laterality Date   CARDIAC CATHETERIZATION  05/2014   ablation for atrial fibrillation   CARDIAC CATHETERIZATION N/A 11/16/2015   Procedure: Left Heart Cath and Coronary Angiography;  Surgeon: Leonie Man, MD;  Location: [facility];  Service: Cardiovascular;  Laterality: N/A;   COLON RESECTION  09/2013   due to large, abnormal polpy. non cancerous per patient.    COLON SURGERY  09/2014   colon resection    EXTRACORPOREAL SHOCK WAVE LITHOTRIPSY Left 01/03/2021   Procedure: EXTRACORPOREAL SHOCK WAVE LITHOTRIPSY (ESWL);  Surgeon: Robley Fries, MD;  Location: [facility];  Service: Urology;  Laterality: Left;   IR PERC PLEURAL DRAIN W/INDWELL CATH W/IMG GUIDE  01/07/2020   LEFT HEART CATH AND CORONARY ANGIOGRAPHY N/A 05/19/2018   Procedure: LEFT HEART CATH AND CORONARY ANGIOGRAPHY;  Surgeon: Wellington Hampshire, MD;  Location: [facility];  Service: Cardiovascular;  Laterality: N/A;    Social History   Socioeconomic History   Marital status: Married    Spouse name: Amethyst   Number of children: 8   Years of education: 13   Highest education level: Some college, no degree  Occupational History  Comment: disabled  Tobacco Use   Smoking status: Former    Packs/day: 0.50    Years: 10.00    Pack years: 5.00    Types: Cigarettes    Quit date: 04/11/2008    Years since quitting: 13.1   Smokeless tobacco: Never  Vaping Use   Vaping Use: Never used  Substance and Sexual Activity   Alcohol use: No    Alcohol/week: 0.0 standard drinks   Drug use: No   Sexual activity: Yes    Partners: Female    Birth control/protection: None  Other Topics Concern   Not on file  Social  History Narrative   Lives with wife. Does not work.  On disability.    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 Provided crisis assistance program information to assist with bills       Has issues getting food- gets $36/month in food stamps- already has list of food pantries but has not tried them- encouraged pt to try food pantries and reach out to clinic for help if needed   Social Determinants of Health   Financial Resource Strain: Low Risk    Difficulty of Paying Living Expenses: Not hard at all  Food Insecurity: No Food Insecurity   Worried About Charity fundraiser in the Last Year: Never true   [place] in the Last Year: Never true  Transportation Needs: No Transportation Needs   Lack of Transportation (Medical): No   Lack of Transportation (Non-Medical): No  Physical Activity: Inactive   Days of Exercise per Week: 0 days   Minutes of Exercise per Session: 0 min  Stress: No Stress Concern Present   Feeling of Stress : Not at all  Social Connections: Moderately Isolated   Frequency of Communication with Friends and Family: Once a week   Frequency of Social Gatherings with Friends and Family: Never   Attends Religious Services: More than 4 times per year   Active Member of [employer] or Organizations: Yes   Attends Music therapist: More than 4 times per year   Marital Status: Separated  Intimate Partner Violence: Not At Risk   Fear of Current or Ex-Partner: No   Emotionally Abused: No   Physically Abused: No   Sexually Abused: No    Current Outpatient Medications on File Prior to Visit  Medication Sig Dispense Refill   Accu-Chek Softclix Lancets lancets USE AS INSTRUCTED 100 each 12   albuterol (VENTOLIN HFA) 108 (90 Base) MCG/ACT inhaler INHALE 2 PUFFS INTO THE LUNGS EVERY 6  HOURS AS NEEDED FOR WHEEZING OR SHORTNESS OF BREATH. 8.5 g 1   allopurinol (ZYLOPRIM) 100 MG tablet Take 2 tablets by mouth daily 60 tablet 5   amLODipine (NORVASC) 5 MG tablet Take 1 tablet (5 mg total) by mouth daily. 90 tablet 1   aspirin (EQ ASPIRIN ADULT LOW DOSE) 81 MG EC tablet Take 1 tablet (81 mg total) by mouth daily. TAKE 1 TABLET BY MOUTH ONCE DAILY. SWALLOW WHOLE Please hold asa until you repeat blood work, resume if blood count stable and ok with you primary care doctor     atorvastatin (LIPITOR) 40 MG tablet Take 1 tablet (40 mg total) by mouth daily. 90 tablet 3   carvedilol (COREG) 12.5 MG tablet TAKE  1 TABLET BY MOUTH TWO TIMES DAILY WITH MEALS 180 tablet 1   clobetasol cream (TEMOVATE) 0.05 % Apply to affected area twice a day 30 g 0   Continuous Blood Gluc Receiver (DEXCOM G6 RECEIVER) DEVI Use as instructed to monitor blood sugar daily 1 each 0   Continuous Blood Gluc Sensor (DEXCOM G6 SENSOR) MISC Use as instructed to monitor blood sugar daily. Change every 10 days. 9 each 3   Continuous Blood Gluc Transmit (DEXCOM G6 TRANSMITTER) MISC Use as instructed to monitor blood sugar daily. Change every 90 days. 1 each 3   COSENTYX SENSOREADY PEN 150 MG/ML SOAJ SMARTSIG:1 Pre-Filled Pen Syringe SUB-Q Every 4 Weeks     dextromethorphan-guaiFENesin (MUCINEX DM) 30-600 MG 12hr tablet Take 1 tablet by mouth 2 (two) times daily as needed for cough. 30 tablet 1   diclofenac Sodium (VOLTAREN) 1 % GEL Apply 4 g topically 4 (four) times daily. (Patient taking differently: Apply 4 g topically 4 (four) times daily as needed (pain).) 100 g 0   DULoxetine (CYMBALTA) 20 MG capsule TAKE 1 CAPSULE BY MOUTH ONCE A DAY 90 capsule 0   eszopiclone (LUNESTA) 1 MG TABS tablet TAKE 1 TABLET BY MOUTH EVERY NIGHT IMMEDIATELY BEFORE BEDTIME AS NEEDED FOR SLEEP 30 tablet 1   famotidine (PEPCID) 20 MG tablet TAKE 1 TABLET BY MOUTH 2 TIMES DAILY 60 tablet 2   fexofenadine (ALLEGRA) 180 MG tablet Take 180 mg by  mouth daily.     fluocinonide cream (LIDEX) 0.05 % Apply 1 (one) application to affected area twice a day 60 g 0   fluticasone (FLONASE) 50 MCG/ACT nasal spray PLACE 1 SPRAY INTO BOTH NOSTRILS DAILY AS NEEDED FOR ALLERGIES OR RHINITIS. 16 g 1   furosemide (LASIX) 40 MG tablet Take 1 tablet (40 mg total) by mouth every evening. 90 tablet 3   furosemide (LASIX) 80 MG tablet Take 1 tablet (80 mg total) by mouth every morning. 90 tablet 3   gabapentin (NEURONTIN) 300 MG capsule Take 1 capsule (300 mg total) by mouth at bedtime. 30 capsule 3   glucose blood test strip USE TO TEST 4 TIMES DAILY. (Patient taking differently: USE TO TEST 4 TIMES DAILY.) 400 strip 3   Insulin Lispro Prot & Lispro (HUMALOG MIX 50/50 KWIKPEN) (50-50) 100 UNIT/ML Kwikpen Inject 95 Units into the skin daily with breakfast. 105 mL 3   Insulin Pen Needle (UNIFINE PENTIPS) 32G X 4 MM MISC USE AS DIRECTED DAILY 100 each 0   isosorbide mononitrate (IMDUR) 60 MG 24 hr tablet Take 1.5 tablets (90 mg total) by mouth daily. 135 tablet 3   mupirocin cream (BACTROBAN) 2 % Apply 1 application topically 2 (two) times daily. 15 g 0   naloxone (NARCAN) nasal spray 4 mg/0.1 mL Place in nostril in the event of suspected overdose on Narcotic medication (Oxycodone) 1 each 0   oxyCODONE (OXY IR/ROXICODONE) 5 MG immediate release tablet Take 5 mg by mouth 3 (three) times daily as needed for moderate pain.      oxymetazoline (12 HOUR DECONGESTANT) 0.05 % nasal spray Place 1 spray into both nostrils 2 (two) times daily. 30 mL 0   polyethylene glycol (MIRALAX / GLYCOLAX) 17 g packet Take 17 g by mouth daily as needed for moderate constipation or severe constipation. 14 each 0   potassium chloride SA (KLOR-CON) 20 MEQ tablet Take 1 and 1/2 tablets (30 mEq total) by mouth daily. 90 tablet 3   Secukinumab (COSENTYX SENSOREADY PEN) 150 MG/ML  SOAJ INJECT ONE PEN SUBCUTANEOUSLY EVERY 4 WEEKS. REFRIGERATE. ALLOW 15 TO 30 MINUTES AT ROOM TEMP PRIOR TO  ADMINISTRATION. 3 mL 1   Sodium Chloride-Sodium Bicarb (KETTLE NETI POT SINUS WASH) 2300-700 MG KIT Flush the sinuses twice a week.  Asked pharmacist to show you how to use. (Patient taking differently: Flush the sinuses twice a week.  Asked pharmacist to show you how to use.) 1 kit 0   Tafamidis 61 MG CAPS TAKE 1 CAPSULE (61 MG) BY MOUTH DAILY. 30 capsule 11   triamcinolone cream (KENALOG) 0.1 % APPLY TO THE AFFECTED AREA(S) TOPICALLY 2 TIMES DAILY AS DIRECTED 454 g 0   UNIFINE PENTIPS 32G X 4 MM MISC USE AS DIRECTED DAILY 100 each 0   metoprolol tartrate (LOPRESSOR) 100 MG tablet Take 1 tablet (100 mg total) by mouth once for 1 dose. 1 tablet 0   [DISCONTINUED] hydrALAZINE (APRESOLINE) 25 MG tablet Take 1 tablet (25 mg total) by mouth 3 (three) times daily. 90 tablet 3   [DISCONTINUED] ramelteon (ROZEREM) 8 MG tablet TAKE 1 TABLET BY MOUTH AT BEDTIME 30 tablet 1   No current facility-administered medications on file prior to visit.    Allergies  Allergen Reactions   Nsaids Other (See Comments)    Stomach pains. Ulcers - stated by patient    Jardiance [Empagliflozin] Nausea And Vomiting    Family History  Problem Relation Age of Onset   Hypertension Mother    Diabetes Mother    COPD Mother    Lung cancer Mother        Smoker    Hypertension Father    Diabetes Father    Heart Problems Father    COPD Father    Colon cancer Neg Hx    Stomach cancer Neg Hx     BP (!) 100/50 (BP Location: Right Arm, Patient Position: Sitting, Cuff Size: Large)   Pulse 71   Ht _0  (1.676 m)   Wt 221 lb 3.2 oz (100.3 kg)   SpO2 96%   BMI 35.70 kg/m   Review of Systems He denies hypoglycemia    Objective:   Physical Exam   Lab Results  Component Value Date   HGBA1C 9.1 (A) 04/16/2021      Assessment & Plan:  Insulin-requiring type 2 DM: uncontrolled.  We discussed.  He agrees to start [facility].    Patient Instructions  check your blood sugar twice a day.  vary the time of day when  you check, between before the 3 meals, and at bedtime.  also check if you have symptoms of your blood sugar being too high or too low.  please keep a record of the readings and bring it to your next appointment here (or you can bring the meter itself).  You can write it on any piece of paper.  please call us sooner if your blood sugar goes below 70, or if most of your readings are over 200.   Please continue the same insulin. I have sent a prescription to your pharmacy, to add "Mounjaro." On this type of insulin schedule, you should eat meals on a regular schedule (especially lunch).  If a meal is missed or significantly delayed, your blood sugar could go low.   Please come back for a follow-up appointment in 2 months.

## 2021-05-23 NOTE — Patient Instructions (Addendum)
check your blood sugar twice a day.  vary the time of day when you check, between before the 3 meals, and at bedtime.  also check if you have symptoms of your blood sugar being too high or too low.  please keep a record of the readings and bring it to your next appointment here (or you can bring the meter itself).  You can write it on any piece of paper.  please call us sooner if your blood sugar goes below 70, or if most of your readings are over 200.   Please continue the same insulin. I have sent a prescription to your pharmacy, to add "Mounjaro." On this type of insulin schedule, you should eat meals on a regular schedule (especially lunch).  If a meal is missed or significantly delayed, your blood sugar could go low.   Please come back for a follow-up appointment in 2 months.

## 2021-05-24 ENCOUNTER — Other Ambulatory Visit (HOSPITAL_COMMUNITY): Payer: Self-pay

## 2021-05-24 ENCOUNTER — Other Ambulatory Visit: Payer: Self-pay

## 2021-05-24 ENCOUNTER — Other Ambulatory Visit: Payer: Self-pay | Admitting: Internal Medicine

## 2021-05-24 DIAGNOSIS — L309 Dermatitis, unspecified: Secondary | ICD-10-CM | POA: Diagnosis not present

## 2021-05-24 MED ORDER — ACITRETIN 25 MG PO CAPS
50.0000 mg | ORAL_CAPSULE | Freq: Every day | ORAL | 0 refills | Status: DC
  Filled 2021-05-24: qty 62, 31d supply, fill #0

## 2021-05-27 ENCOUNTER — Telehealth: Payer: Self-pay | Admitting: Cardiology

## 2021-05-27 ENCOUNTER — Other Ambulatory Visit (HOSPITAL_COMMUNITY): Payer: Self-pay

## 2021-05-27 NOTE — Telephone Encounter (Signed)
Sueanne Margarita, MD  05/20/2021 10:16 PM EDT     Stress test showed evidence of infarct with no hx of MI in the past and EF 36% with multiple focal wall motion abnormalities.  At this point, given his ongoing CP, EKG changes and abnormal stress test, I think we need to proceed with cardiac cath to redefine coronary anatomy.  Please get him set up for LHC with no LV gram. Hold lasix the night before and morning of cath   Antonieta Iba, RN  05/22/2021 11:15 AM EDT     Patient has an appointment with Cecilie Kicks, NP on 10/21. Per [name] - okay to wait until patient sees Mickel Baas to go over heart cath.    The patient has been notified of the result and verbalized understanding.  All questions (if any) were answered. Antonieta Iba, RN 05/27/2021 1:33 PM

## 2021-05-27 NOTE — Telephone Encounter (Signed)
Follow Up:      Patient wants to know if his Stress Test results are ready from 05-14-21 please?

## 2021-05-28 ENCOUNTER — Encounter: Payer: Self-pay | Admitting: Internal Medicine

## 2021-05-30 ENCOUNTER — Other Ambulatory Visit (HOSPITAL_COMMUNITY): Payer: Self-pay

## 2021-05-31 ENCOUNTER — Other Ambulatory Visit: Payer: Self-pay

## 2021-05-31 ENCOUNTER — Ambulatory Visit (INDEPENDENT_AMBULATORY_CARE_PROVIDER_SITE_OTHER): Payer: Medicare Other | Admitting: Cardiology

## 2021-05-31 ENCOUNTER — Encounter: Payer: Self-pay | Admitting: Cardiology

## 2021-05-31 VITALS — BP 112/74 | HR 66 | Ht 66.0 in | Wt 223.4 lb

## 2021-05-31 DIAGNOSIS — E118 Type 2 diabetes mellitus with unspecified complications: Secondary | ICD-10-CM | POA: Diagnosis not present

## 2021-05-31 DIAGNOSIS — G4733 Obstructive sleep apnea (adult) (pediatric): Secondary | ICD-10-CM

## 2021-05-31 DIAGNOSIS — R9431 Abnormal electrocardiogram [ECG] [EKG]: Secondary | ICD-10-CM | POA: Diagnosis not present

## 2021-05-31 DIAGNOSIS — I2510 Atherosclerotic heart disease of native coronary artery without angina pectoris: Secondary | ICD-10-CM | POA: Diagnosis not present

## 2021-05-31 DIAGNOSIS — R9439 Abnormal result of other cardiovascular function study: Secondary | ICD-10-CM | POA: Diagnosis not present

## 2021-05-31 DIAGNOSIS — R079 Chest pain, unspecified: Secondary | ICD-10-CM | POA: Diagnosis not present

## 2021-05-31 DIAGNOSIS — Z794 Long term (current) use of insulin: Secondary | ICD-10-CM

## 2021-05-31 DIAGNOSIS — N183 Chronic kidney disease, stage 3 unspecified: Secondary | ICD-10-CM | POA: Diagnosis not present

## 2021-05-31 DIAGNOSIS — I428 Other cardiomyopathies: Secondary | ICD-10-CM

## 2021-05-31 DIAGNOSIS — I10 Essential (primary) hypertension: Secondary | ICD-10-CM

## 2021-05-31 NOTE — Progress Notes (Signed)
Cardiology Office Note   Date:  05/31/2021   ID:  Armistead, Sult 31-Dec-1955, MRN [id number]  PCP:  [name], MD  Cardiologist:  [name]    Chief Complaint  Patient presents with   Chest Pain      History of Present Illness: Marco [name] is a 65 y.o. male who presents for review of stress test and discuss cardiac cath.     hx of combined systolic and diastolic heart failure, nonischemic cardiomyopathy, mild nonobstructive coronary artery disease by cardiac catheterization in 2017, persistent atrial fibrillation with prior history of ablation in [place] in 2017, diabetes, hypertension, chronic kidney disease, sleep apnea, obesity and OSA on CPAP.    His EF was 45-50 but improved to normal by Echo in 01/2017. CHADS2-VASc=4 (CHF, DM, CAD, HTN).  Anticoagulation for PAF has been discontinued in the past secondary to anemia.  He has a history of chronic dyspnea and chronic chest discomfort.  Evaluation by pulmonology included a cardiopulmonary stress test in 2017.  However, this was not conclusive due to submaximal effort.    He was seen in August 2019 for worsening shortness of breath.  An echocardiogram in September 2019 demonstrated worsening LV function with an EF of 35-40%.     Coronary CTA was planned however, the patient was admitted in October 2019 with chest pain and mildly elevated troponin.  Echocardiogram demonstrated EF 25-30%.  Cardiac catheterization demonstrated mild plaque without obstructive disease.  CHF medications were adjusted for nonischemic cardiomyopathy.     He was evaluated by [name] in the advanced heart failure clinic in October 2019.  PYP scan was strongly suggestive of transthyretin amyloidosis (grade 2, H/CLL equal 1.94) .  After discussion with [name] with Hematology he felt he did not have myeloma.  Genetic testing negative and myeloma panel with MUGUS but no myeloma.  Now on Tafamadis and followed by AHF clinic. His  Entresto, Hydralazine and imdur due to soft BP.   On last visit with [name] 04/29/21 he complained of chest pain. Would come and go.  A tightness at rest not with exertion.  His chronic SOB is without change.  He uses his CPAP at night.  His EKG with more prominent T wave changes in ant leads.  [name] ordered stress myoview and increased his imdur to 60 mg daily. Nuc study with evidence of infarct but pt without hx of MI,  EF 36% with multiple focal wall motion abnormalities.  With ongoing chest pain and EKG changes and abnormal nuc plan for cardiac cath with no LV gram.  Will hold lasix the night before and AM of cath.       Today his chest pain has improved with increase of his imdur.  He still has dyspnea.  We discussed his stress test.  Discussed cardiac cath in detail. His wife was with him.  Explained why [name] wanted the cath.    Past Medical History:  Diagnosis Date   Benign colon polyp 08/01/2013   [facility] in [place]. large base tranverse colon polyp was biopsied. polyp was benign with minimal surface hyperplastic change.   Chronic combined systolic and diastolic CHF (congestive heart failure) (HCC)    Chronic pain    CKD (chronic kidney disease), stage III (HCC)    Diabetes mellitus without complication ([place])    Diverticulosis    Esophageal hiatal hernia 07/29/2013   confirmed on EGD    Esophageal stricture  Essential hypertension    Gastritis 07/29/2013   confirmed on EGD, bx done an negative for intestinal metaplasia, dsyplasia or H. pylori. normal gastric emptying study done 07/13/2013.   GERD (gastroesophageal reflux disease)    Morbid obesity (HCC)    Non-obstructive CAD    Coronary CTA 04/2021 showed coronary calcium score of 535 which is 93rd % for age and sex matched controls.  There is mild CAD with mild calcified plaque in the distal LAD 25-49% and also in the ostial D3.  There is minimal calcified plaque in the mid LCx and mild mixed plaque in  the pRCA with 25-49% stenosis.   Persistent atrial fibrillation ([place])    a. 02/2013 s/p rfca in [place], [place]-->prev on Xarelto, d/c'd 2/2 anemia, ? GIB.   Rheumatoid arthritis ([place])    Sigmoid diverticulosis 08/01/2013   confirmed on colonscopy. record scanned into chart    Past Surgical History:  Procedure Laterality Date   CARDIAC CATHETERIZATION  05/2014   ablation for atrial fibrillation   CARDIAC CATHETERIZATION N/A 11/16/2015   Procedure: Left Heart Cath and Coronary Angiography;  Surgeon: Leonie Man, MD;  Location: [facility];  Service: Cardiovascular;  Laterality: N/A;   COLON RESECTION  09/2013   due to large, abnormal polpy. non cancerous per patient.    COLON SURGERY  09/2014   colon resection    EXTRACORPOREAL SHOCK WAVE LITHOTRIPSY Left 01/03/2021   Procedure: EXTRACORPOREAL SHOCK WAVE LITHOTRIPSY (ESWL);  Surgeon: Robley Fries, MD;  Location: [facility];  Service: Urology;  Laterality: Left;   IR PERC PLEURAL DRAIN W/INDWELL CATH W/IMG GUIDE  01/07/2020   LEFT HEART CATH AND CORONARY ANGIOGRAPHY N/A 05/19/2018   Procedure: LEFT HEART CATH AND CORONARY ANGIOGRAPHY;  Surgeon: Wellington Hampshire, MD;  Location: [facility];  Service: Cardiovascular;  Laterality: N/A;     Current Outpatient Medications  Medication Sig Dispense Refill   Accu-Chek Softclix Lancets lancets USE AS INSTRUCTED 100 each 12   acitretin (SORIATANE) 25 MG capsule Take 2 capsules (50 mg total) by mouth daily. 62 capsule 0   albuterol (VENTOLIN HFA) 108 (90 Base) MCG/ACT inhaler INHALE 2 PUFFS INTO THE LUNGS EVERY 6 HOURS AS NEEDED FOR WHEEZING OR SHORTNESS OF BREATH. 8.5 g 1   allopurinol (ZYLOPRIM) 100 MG tablet Take 2 tablets by mouth daily 60 tablet 5   amLODipine (NORVASC) 5 MG tablet Take 1 tablet (5 mg total) by mouth daily. 90 tablet 1   aspirin (EQ ASPIRIN ADULT LOW DOSE) 81 MG EC tablet Take 1 tablet (81 mg total) by mouth daily. TAKE 1 TABLET BY MOUTH ONCE  DAILY. SWALLOW WHOLE Please hold asa until you repeat blood work, resume if blood count stable and ok with you primary care doctor     atorvastatin (LIPITOR) 40 MG tablet Take 1 tablet (40 mg total) by mouth daily. 90 tablet 3   carvedilol (COREG) 12.5 MG tablet TAKE 1 TABLET BY MOUTH TWO TIMES DAILY WITH MEALS 180 tablet 1   Continuous Blood Gluc Receiver (DEXCOM G6 RECEIVER) DEVI Use as instructed to monitor blood sugar daily 1 each 0   Continuous Blood Gluc Sensor (DEXCOM G6 SENSOR) MISC Use as instructed to monitor blood sugar daily. Change every 10 days. 9 each 3   Continuous Blood Gluc Transmit (DEXCOM G6 TRANSMITTER) MISC Use as instructed to monitor blood sugar daily. Change every 90 days. 1 each [place]  Pre-Filled Pen Syringe SUB-Q Every 4 Weeks     dextromethorphan-guaiFENesin (MUCINEX DM) 30-600 MG 12hr tablet Take 1 tablet by mouth 2 (two) times daily as needed for cough. 30 tablet 1   diclofenac Sodium (VOLTAREN) 1 % GEL Apply 4 g topically 4 (four) times daily. (Patient taking differently: Apply 4 g topically 4 (four) times daily as needed (pain).) 100 g 0   DULoxetine (CYMBALTA) 20 MG capsule TAKE 1 CAPSULE BY MOUTH ONCE A DAY 90 capsule 0   eszopiclone (LUNESTA) 1 MG TABS tablet TAKE 1 TABLET BY MOUTH EVERY NIGHT IMMEDIATELY BEFORE BEDTIME AS NEEDED FOR SLEEP 30 tablet 1   famotidine (PEPCID) 20 MG tablet TAKE 1 TABLET BY MOUTH 2 TIMES DAILY 60 tablet 2   fexofenadine (ALLEGRA) 180 MG tablet Take 180 mg by mouth daily.     fluticasone (FLONASE) 50 MCG/ACT nasal spray PLACE 1 SPRAY INTO BOTH NOSTRILS DAILY AS NEEDED FOR ALLERGIES OR RHINITIS. 16 g 1   furosemide (LASIX) 40 MG tablet Take 1 tablet (40 mg total) by mouth every evening. 90 tablet 3   furosemide (LASIX) 80 MG tablet Take 1 tablet (80 mg total) by mouth every morning. 90 tablet 3   gabapentin (NEURONTIN) 300 MG capsule Take 1 capsule (300 mg total) by mouth at bedtime. 30  capsule 3   glucose blood test strip USE TO TEST 4 TIMES DAILY. (Patient taking differently: USE TO TEST 4 TIMES DAILY.) 400 strip 3   Insulin Lispro Prot & Lispro (HUMALOG MIX 50/50 KWIKPEN) (50-50) 100 UNIT/ML Kwikpen Inject 95 Units into the skin daily with breakfast. 105 mL 3   Insulin Pen Needle (UNIFINE PENTIPS) 32G X 4 MM MISC USE AS DIRECTED DAILY 100 each 0   isosorbide mononitrate (IMDUR) 60 MG 24 hr tablet Take 1.5 tablets (90 mg total) by mouth daily. 135 tablet 3   naloxone (NARCAN) nasal spray 4 mg/0.1 mL Place in nostril in the event of suspected overdose on Narcotic medication (Oxycodone) 1 each 0   oxyCODONE-acetaminophen (PERCOCET/ROXICET) 5-325 MG tablet Take 1 tablet by mouth 4 (four) times daily.     oxymetazoline (12 HOUR DECONGESTANT) 0.05 % nasal spray Place 1 spray into both nostrils 2 (two) times daily. 30 mL 0   polyethylene glycol (MIRALAX / GLYCOLAX) 17 g packet Take 17 g by mouth daily as needed for moderate constipation or severe constipation. 14 each 0   potassium chloride SA (KLOR-CON) 20 MEQ tablet Take 1 and 1/2 tablets (30 mEq total) by mouth daily. 90 tablet 3   Secukinumab (COSENTYX SENSOREADY PEN) 150 MG/ML SOAJ INJECT ONE PEN SUBCUTANEOUSLY EVERY 4 WEEKS. REFRIGERATE. ALLOW 15 TO 30 MINUTES AT ROOM TEMP PRIOR TO ADMINISTRATION. 3 mL 1   Sodium Chloride-Sodium Bicarb (KETTLE NETI POT SINUS WASH) 2300-700 MG KIT Flush the sinuses twice a week.  Asked pharmacist to show you how to use. (Patient taking differently: Flush the sinuses twice a week.  Asked pharmacist to show you how to use.) 1 kit 0   Tafamidis 61 MG CAPS TAKE 1 CAPSULE (61 MG) BY MOUTH DAILY. 30 capsule 11   tirzepatide (MOUNJARO) 2.5 MG/0.5ML Pen Inject 1 pen (2.5 mg) into the skin once a week. 2 mL 3   triamcinolone cream (KENALOG) 0.1 % APPLY TO THE AFFECTED AREA(S) TOPICALLY 2 TIMES DAILY AS DIRECTED 454 g 0   UNIFINE PENTIPS 32G X 4 MM MISC USE AS DIRECTED DAILY 100 each 0   clobetasol cream  (TEMOVATE) 0.05 % Apply  to affected area twice a day (Patient not taking: Reported on 05/31/2021) 30 g 0   fluocinonide cream (LIDEX) 0.05 % Apply 1 (one) application to affected area twice a day (Patient not taking: Reported on 05/31/2021) 60 g 0   metoprolol tartrate (LOPRESSOR) 100 MG tablet Take 1 tablet (100 mg total) by mouth once for 1 dose. 1 tablet 0   mupirocin cream (BACTROBAN) 2 % Apply 1 application topically 2 (two) times daily. (Patient not taking: Reported on 05/31/2021) 15 g 0   oxyCODONE (OXY IR/ROXICODONE) 5 MG immediate release tablet Take 5 mg by mouth 3 (three) times daily as needed for moderate pain.  (Patient not taking: Reported on 05/31/2021)     No current facility-administered medications for this visit.    Allergies:   Nsaids and Jardiance [empagliflozin]    Social History:  The patient  reports that he quit smoking about 13 years ago. His smoking use included cigarettes. He has a 5.00 pack-year smoking history. He has never used smokeless tobacco. He reports that he does not drink alcohol and does not use drugs.   Family History:  The patient's family history includes COPD in his father and mother; Diabetes in his father and mother; Heart Problems in his father; Hypertension in his father and mother; Lung cancer in his mother.    ROS:  General:no colds or fevers, no weight changes Skin:no rashes or ulcers HEENT:no blurred vision, no congestion CV:see HPI PUL:see HPI GI:no diarrhea constipation or melena, no indigestion GU:no hematuria, no dysuria MS:no joint pain, no claudication Neuro:no syncope, no lightheadedness Endo:+ diabetes, no thyroid disease  Wt Readings from Last 3 Encounters:  05/31/21 223 lb 6.4 oz (101.3 kg)  05/23/21 221 lb 3.2 oz (100.3 kg)  05/14/21 218 lb (98.9 kg)     PHYSICAL EXAM: VS:  BP 112/74   Pulse 66   Ht 5' 6" (1.676 m)   Wt 223 lb 6.4 oz (101.3 kg)   SpO2 96%   BMI 36.06 kg/m  , BMI Body mass index is 36.06  kg/m. General:Pleasant affect, NAD Skin:Warm and dry, brisk capillary refill HEENT:normocephalic, sclera clear, mucus membranes moist Neck:supple, no JVD, no bruits  Heart:S1S2 RRR without murmur, gallup, rub or click Lungs:clear without rales, rhonchi, or wheezes ZHY:QMVH, non tender, + BS, do not palpate liver spleen or masses Ext:no lower ext edema, 2+ pedal pulses, 2+ radial pulses Neuro:alert and oriented X 3, MAE, follows commands, + facial symmetry    EKG:  EKG is ordered today. The ekg ordered today demonstrates SR at 66 ant and inf T wave inversions. No change from prior recent EKG.    Recent Labs: 08/21/2020: B Natriuretic Peptide 106.4 11/06/2020: Hemoglobin 12.6; Platelets 257 01/11/2021: ALT 8 04/11/2021: BUN 11; Creatinine, Ser 1.39; Potassium 4.0; Sodium 141; TSH 1.720    Lipid Panel    Component Value Date/Time   CHOL 97 (L) 01/11/2021 1104   TRIG 172 (H) 01/11/2021 1104   HDL 27 (L) 01/11/2021 1104   CHOLHDL 3.6 01/11/2021 1104   CHOLHDL 2.6 06/10/2016 1253   VLDL 18 06/10/2016 1253   LDLCALC 41 01/11/2021 1104       Other studies Reviewed: Additional studies/ records that were reviewed today include:  Stress test.  05/14/21 .   Findings are consistent with prior myocardial infarction with peri-infarct ischemia. The study is intermediate risk.   No ST deviation was noted.   LV perfusion is abnormal. There is no evidence of ischemia. There is evidence  of infarction. Defect 1: There is a medium defect with moderate reduction in uptake present in the apical to mid inferior and apex location(s) that is fixed. There is abnormal wall motion in the defect area. Consistent with infarction and peri-infarct ischemia.   Left ventricular function is abnormal. Global function is moderately reduced. Nuclear stress EF: 36 %. The left ventricular ejection fraction is moderately decreased (30-44%). End diastolic cavity size is mildly enlarged. End systolic cavity size is mildly  enlarged.   Prior study available for comparison from 01/01/2017. No changes compared to prior study. Nuclear stress EF: 44%. There was no ST segment deviation noted during stress. Defect 1: There is a small defect of severe severity present in the apical septal and apex location. This is a low risk study. The left ventricular ejection fraction is moderately decreased (30-44%).   Low risk stress nuclear study with small prior distal septal/apical infarct; no ischemia; EF 44 with global hypokinesis and mild LVE.   Moderate size and intensity partially reversible inferoapical, apical and apical lateral perfusion defect suggestive of scar with minimal peri-infarct ischemia (SDS 3). LVEF 36% with distal apical, apical lateral and apical hypokinesis. This is an intermediate risk study. Compared to a prior study in 2018, the LVEF is lower (was 44%), however, the perfusion defect is similar.  Echo 08/21/20  IMPRESSIONS     1. Left ventricular ejection fraction, by estimation, is 50 to 55%. The  left ventricle has low normal function. The left ventricle has no regional  wall motion abnormalities. The left ventricular internal cavity size was  moderately to severely dilated.  There is moderate left ventricular hypertrophy. Left ventricular diastolic  parameters are indeterminate.   2. Right ventricular systolic function is normal. The right ventricular  size is normal. There is normal pulmonary artery systolic pressure.   3. The mitral valve is normal in structure. Trivial mitral valve  regurgitation. No evidence of mitral stenosis.   4. The aortic valve is tricuspid. Aortic valve regurgitation is not  visualized. No aortic stenosis is present.   5. The inferior vena cava is normal in size with greater than 50%  respiratory variability, suggesting right atrial pressure of 3 mmHg.   Comparison(s): Changes from prior study are noted.   Conclusion(s)/Recommendation(s): EF improved from prior study.    FINDINGS   Left Ventricle: Left ventricular ejection fraction, by estimation, is 50  to 55%. The left ventricle has low normal function. The left ventricle has  no regional wall motion abnormalities. The left ventricular internal  cavity size was moderately to  severely dilated. There is moderate left ventricular hypertrophy. Left  ventricular diastolic parameters are indeterminate.   Right Ventricle: The right ventricular size is normal. No increase in  right ventricular wall thickness. Right ventricular systolic function is  normal. There is normal pulmonary artery systolic pressure. The tricuspid  regurgitant velocity is 2.39 m/s, and   with an assumed right atrial pressure of 3 mmHg, the estimated right  ventricular systolic pressure is 91.6 mmHg.   Left Atrium: Left atrial size was normal in size.   Right Atrium: Right atrial size was normal in size.   Pericardium: Trivial pericardial effusion is present.   Mitral Valve: The mitral valve is normal in structure. Trivial mitral  valve regurgitation. No evidence of mitral valve stenosis.   Tricuspid Valve: The tricuspid valve is normal in structure. Tricuspid  valve regurgitation is trivial. No evidence of tricuspid stenosis.   Aortic Valve: The aortic valve  is tricuspid. Aortic valve regurgitation is  not visualized. No aortic stenosis is present.   Pulmonic Valve: The pulmonic valve was not well visualized. Pulmonic valve  regurgitation is not visualized.   Aorta: The aortic root, ascending aorta and aortic arch are all  structurally normal, with no evidence of dilitation or obstruction.   Venous: The inferior vena cava is normal in size with greater than 50%  respiratory variability, suggesting right atrial pressure of 3 mmHg.   IAS/Shunts: The atrial septum is grossly normal.      LEFT VENTRICLE  PLAX 2D  LVIDd:         5.80 cm      Diastology  LVIDs:         4.00 cm      LV e' medial:    3.48 cm/s  LV PW:          1.30 cm      LV E/e' medial:  16.9  LV IVS:        1.40 cm      LV e' lateral:   5.66 cm/s  LVOT diam:     2.40 cm      LV E/e' lateral: 10.4  LV SV:         69  LV SV Index:   33  LVOT Area:     4.52 cm   ASSESSMENT AND PLAN:  1.  More freq episodes of chest pain and SOB with abnormal EKG and abnormal Stress test.  [name] has reviewed and would like cardiac cath without LV gram (Hx of renal failure with sepsis)   he will hold lasix night before procedure and day of procedure.    The patient understands that risks included but are not limited to stroke (1 in 1000), death (1 in 83), kidney failure [usually temporary] (1 in 500), bleeding (1 in 200), allergic reaction [possibly serious] (1 in 200). Pt agreeable to proceed.  Follow up 2 weeks post cath with [name] or APP. Pt preferred to wait until week on 06/10/21 for procedure, he understands that if symptoms increase he should go to ER.  2.  Hx of non obstructive coronary artery disease - but now with new symptoms and abnormal studies.  3.  NICM in past with improvement of  EF to 50-55% in 08/2020 and now drop in EF on stress test to 38%.  4.  CKD 3 last Cr was 1.39 will recheck before cath.  Will hold lasix as above.  5.  HLD on lipitor 40 mg last lipids were in June LDL 41 and HDL 27 continue statin.  6.  DM-2 on insulin, will take half the amount before cath.   7.  OSA per [name] tolerating PAP therapy.    8.  HTN controlled continue coreg, lasix hydralazine and amlodipine.   9.  PAF  hx of RFCA in 2017 and maintainig SR.  He was on xarelto in past but stopped due to anemia.          Current medicines are reviewed with the patient today.  The patient Has no concerns regarding medicines.  The following changes have been made:  See above Labs/ tests ordered today include:see above  Disposition:   FU:  see above  Signed, Cecilie Kicks, NP  05/31/2021 2:50 PM    [facility] [address], [place], [place], [place] Phone: [phone]; Fax: [phone]  [phone]

## 2021-05-31 NOTE — H&P (View-Only) (Signed)
 Cardiology Office Note   Date:  05/31/2021   ID:  Marco [name], DOB 08/16/1955, MRN [id number]  PCP:  Johnson, Deborah B, MD  Cardiologist:  Dr. Turner    Chief Complaint  Patient presents with   Chest Pain      History of Present Illness: Marco [name] is a 65 y.o. male who presents for review of stress test and discuss cardiac cath.     hx of combined systolic and diastolic heart failure, nonischemic cardiomyopathy, mild nonobstructive coronary artery disease by cardiac catheterization in 2017, persistent atrial fibrillation with prior history of ablation in [place] in 2017, diabetes, hypertension, chronic kidney disease, sleep apnea, obesity and OSA on CPAP.    His EF was 45-50 but improved to normal by Echo in 01/2017. CHADS2-VASc=4 (CHF, DM, CAD, HTN).  Anticoagulation for PAF has been discontinued in the past secondary to anemia.  He has a history of chronic dyspnea and chronic chest discomfort.  Evaluation by pulmonology included a cardiopulmonary stress test in 2017.  However, this was not conclusive due to submaximal effort.    He was seen in August 2019 for worsening shortness of breath.  An echocardiogram in September 2019 demonstrated worsening LV function with an EF of 35-40%.     Coronary CTA was planned however, the patient was admitted in October 2019 with chest pain and mildly elevated troponin.  Echocardiogram demonstrated EF 25-30%.  Cardiac catheterization demonstrated mild plaque without obstructive disease.  CHF medications were adjusted for nonischemic cardiomyopathy.     He was evaluated by Dr. Bensimhon in the advanced heart failure clinic in October 2019.  PYP scan was strongly suggestive of transthyretin amyloidosis (grade 2, H/CLL equal 1.94) .  After discussion with Dr. Granfortuna with Hematology he felt he did not have myeloma.  Genetic testing negative and myeloma panel with MUGUS but no myeloma.  Now on Tafamadis and followed by AHF clinic. His  Entresto, Hydralazine and imdur due to soft BP.   On last visit with Dr. Turner 04/29/21 he complained of chest pain. Would come and go.  A tightness at rest not with exertion.  His chronic SOB is without change.  He uses his CPAP at night.  His EKG with more prominent T wave changes in ant leads.  Dr. Turner ordered stress myoview and increased his imdur to 60 mg daily. Nuc study with evidence of infarct but pt without hx of MI,  EF 36% with multiple focal wall motion abnormalities.  With ongoing chest pain and EKG changes and abnormal nuc plan for cardiac cath with no LV gram.  Will hold lasix the night before and AM of cath.       Today his chest pain has improved with increase of his imdur.  He still has dyspnea.  We discussed his stress test.  Discussed cardiac cath in detail. His wife was with him.  Explained why Dr. Turner wanted the cath.    Past Medical History:  Diagnosis Date   Benign colon polyp 08/01/2013   [facility] in [place]. large base tranverse colon polyp was biopsied. polyp was benign with minimal surface hyperplastic change.   Chronic combined systolic and diastolic CHF (congestive heart failure) (HCC)    Chronic pain    CKD (chronic kidney disease), stage III (HCC)    Diabetes mellitus without complication (HCC)    Diverticulosis    Esophageal hiatal hernia 07/29/2013   confirmed on EGD    Esophageal stricture      Essential hypertension    Gastritis 07/29/2013   confirmed on EGD, bx done an negative for intestinal metaplasia, dsyplasia or H. pylori. normal gastric emptying study done 07/13/2013.   GERD (gastroesophageal reflux disease)    Morbid obesity (HCC)    Non-obstructive CAD    Coronary CTA 04/2021 showed coronary calcium score of 535 which is 93rd % for age and sex matched controls.  There is mild CAD with mild calcified plaque in the distal LAD 25-49% and also in the ostial D3.  There is minimal calcified plaque in the mid LCx and mild mixed plaque in  the pRCA with 25-49% stenosis.   Persistent atrial fibrillation (HCC)    a. 02/2013 s/p rfca in [place], [place]-->prev on Xarelto, d/c'd 2/2 anemia, ? GIB.   Rheumatoid arthritis (HCC)    Sigmoid diverticulosis 08/01/2013   confirmed on colonscopy. record scanned into chart    Past Surgical History:  Procedure Laterality Date   CARDIAC CATHETERIZATION  05/2014   ablation for atrial fibrillation   CARDIAC CATHETERIZATION N/A 11/16/2015   Procedure: Left Heart Cath and Coronary Angiography;  Surgeon: David W Harding, MD;  Location: [facility];  Service: Cardiovascular;  Laterality: N/A;   COLON RESECTION  09/2013   due to large, abnormal polpy. non cancerous per patient.    COLON SURGERY  09/2014   colon resection    EXTRACORPOREAL SHOCK WAVE LITHOTRIPSY Left 01/03/2021   Procedure: EXTRACORPOREAL SHOCK WAVE LITHOTRIPSY (ESWL);  Surgeon: Pace, Maryellen D, MD;  Location: [facility];  Service: Urology;  Laterality: Left;   IR PERC PLEURAL DRAIN W/INDWELL CATH W/IMG GUIDE  01/07/2020   LEFT HEART CATH AND CORONARY ANGIOGRAPHY N/A 05/19/2018   Procedure: LEFT HEART CATH AND CORONARY ANGIOGRAPHY;  Surgeon: Arida, Muhammad A, MD;  Location: [facility];  Service: Cardiovascular;  Laterality: N/A;     Current Outpatient Medications  Medication Sig Dispense Refill   Accu-Chek Softclix Lancets lancets USE AS INSTRUCTED 100 each 12   acitretin (SORIATANE) 25 MG capsule Take 2 capsules (50 mg total) by mouth daily. 62 capsule 0   albuterol (VENTOLIN HFA) 108 (90 Base) MCG/ACT inhaler INHALE 2 PUFFS INTO THE LUNGS EVERY 6 HOURS AS NEEDED FOR WHEEZING OR SHORTNESS OF BREATH. 8.5 g 1   allopurinol (ZYLOPRIM) 100 MG tablet Take 2 tablets by mouth daily 60 tablet 5   amLODipine (NORVASC) 5 MG tablet Take 1 tablet (5 mg total) by mouth daily. 90 tablet 1   aspirin (EQ ASPIRIN ADULT LOW DOSE) 81 MG EC tablet Take 1 tablet (81 mg total) by mouth daily. TAKE 1 TABLET BY MOUTH ONCE  DAILY. SWALLOW WHOLE Please hold asa until you repeat blood work, resume if blood count stable and ok with you primary care doctor     atorvastatin (LIPITOR) 40 MG tablet Take 1 tablet (40 mg total) by mouth daily. 90 tablet 3   carvedilol (COREG) 12.5 MG tablet TAKE 1 TABLET BY MOUTH TWO TIMES DAILY WITH MEALS 180 tablet 1   Continuous Blood Gluc Receiver (DEXCOM G6 RECEIVER) DEVI Use as instructed to monitor blood sugar daily 1 each 0   Continuous Blood Gluc Sensor (DEXCOM G6 SENSOR) MISC Use as instructed to monitor blood sugar daily. Change every 10 days. 9 each 3   Continuous Blood Gluc Transmit (DEXCOM G6 TRANSMITTER) MISC Use as instructed to monitor blood sugar daily. Change every 90 days. 1 each 3   COSENTYX SENSOREADY PEN 150 MG/ML SOAJ SMARTSIG:1   Pre-Filled Pen Syringe SUB-Q Every 4 Weeks     dextromethorphan-guaiFENesin (MUCINEX DM) 30-600 MG 12hr tablet Take 1 tablet by mouth 2 (two) times daily as needed for cough. 30 tablet 1   diclofenac Sodium (VOLTAREN) 1 % GEL Apply 4 g topically 4 (four) times daily. (Patient taking differently: Apply 4 g topically 4 (four) times daily as needed (pain).) 100 g 0   DULoxetine (CYMBALTA) 20 MG capsule TAKE 1 CAPSULE BY MOUTH ONCE A DAY 90 capsule 0   eszopiclone (LUNESTA) 1 MG TABS tablet TAKE 1 TABLET BY MOUTH EVERY NIGHT IMMEDIATELY BEFORE BEDTIME AS NEEDED FOR SLEEP 30 tablet 1   famotidine (PEPCID) 20 MG tablet TAKE 1 TABLET BY MOUTH 2 TIMES DAILY 60 tablet 2   fexofenadine (ALLEGRA) 180 MG tablet Take 180 mg by mouth daily.     fluticasone (FLONASE) 50 MCG/ACT nasal spray PLACE 1 SPRAY INTO BOTH NOSTRILS DAILY AS NEEDED FOR ALLERGIES OR RHINITIS. 16 g 1   furosemide (LASIX) 40 MG tablet Take 1 tablet (40 mg total) by mouth every evening. 90 tablet 3   furosemide (LASIX) 80 MG tablet Take 1 tablet (80 mg total) by mouth every morning. 90 tablet 3   gabapentin (NEURONTIN) 300 MG capsule Take 1 capsule (300 mg total) by mouth at bedtime. 30  capsule 3   glucose blood test strip USE TO TEST 4 TIMES DAILY. (Patient taking differently: USE TO TEST 4 TIMES DAILY.) 400 strip 3   Insulin Lispro Prot & Lispro (HUMALOG MIX 50/50 KWIKPEN) (50-50) 100 UNIT/ML Kwikpen Inject 95 Units into the skin daily with breakfast. 105 mL 3   Insulin Pen Needle (UNIFINE PENTIPS) 32G X 4 MM MISC USE AS DIRECTED DAILY 100 each 0   isosorbide mononitrate (IMDUR) 60 MG 24 hr tablet Take 1.5 tablets (90 mg total) by mouth daily. 135 tablet 3   naloxone (NARCAN) nasal spray 4 mg/0.1 mL Place in nostril in the event of suspected overdose on Narcotic medication (Oxycodone) 1 each 0   oxyCODONE-acetaminophen (PERCOCET/ROXICET) 5-325 MG tablet Take 1 tablet by mouth 4 (four) times daily.     oxymetazoline (12 HOUR DECONGESTANT) 0.05 % nasal spray Place 1 spray into both nostrils 2 (two) times daily. 30 mL 0   polyethylene glycol (MIRALAX / GLYCOLAX) 17 g packet Take 17 g by mouth daily as needed for moderate constipation or severe constipation. 14 each 0   potassium chloride SA (KLOR-CON) 20 MEQ tablet Take 1 and 1/2 tablets (30 mEq total) by mouth daily. 90 tablet 3   Secukinumab (COSENTYX SENSOREADY PEN) 150 MG/ML SOAJ INJECT ONE PEN SUBCUTANEOUSLY EVERY 4 WEEKS. REFRIGERATE. ALLOW 15 TO 30 MINUTES AT ROOM TEMP PRIOR TO ADMINISTRATION. 3 mL 1   Sodium Chloride-Sodium Bicarb (KETTLE NETI POT SINUS WASH) 2300-700 MG KIT Flush the sinuses twice a week.  Asked pharmacist to show you how to use. (Patient taking differently: Flush the sinuses twice a week.  Asked pharmacist to show you how to use.) 1 kit 0   Tafamidis 61 MG CAPS TAKE 1 CAPSULE (61 MG) BY MOUTH DAILY. 30 capsule 11   tirzepatide (MOUNJARO) 2.5 MG/0.5ML Pen Inject 1 pen (2.5 mg) into the skin once a week. 2 mL 3   triamcinolone cream (KENALOG) 0.1 % APPLY TO THE AFFECTED AREA(S) TOPICALLY 2 TIMES DAILY AS DIRECTED 454 g 0   UNIFINE PENTIPS 32G X 4 MM MISC USE AS DIRECTED DAILY 100 each 0   clobetasol cream  (TEMOVATE) 0.05 % Apply  to affected area twice a day (Patient not taking: Reported on 05/31/2021) 30 g 0   fluocinonide cream (LIDEX) 0.05 % Apply 1 (one) application to affected area twice a day (Patient not taking: Reported on 05/31/2021) 60 g 0   metoprolol tartrate (LOPRESSOR) 100 MG tablet Take 1 tablet (100 mg total) by mouth once for 1 dose. 1 tablet 0   mupirocin cream (BACTROBAN) 2 % Apply 1 application topically 2 (two) times daily. (Patient not taking: Reported on 05/31/2021) 15 g 0   oxyCODONE (OXY IR/ROXICODONE) 5 MG immediate release tablet Take 5 mg by mouth 3 (three) times daily as needed for moderate pain.  (Patient not taking: Reported on 05/31/2021)     No current facility-administered medications for this visit.    Allergies:   Nsaids and Jardiance [empagliflozin]    Social History:  The patient  reports that he quit smoking about 13 years ago. His smoking use included cigarettes. He has a 5.00 pack-year smoking history. He has never used smokeless tobacco. He reports that he does not drink alcohol and does not use drugs.   Family History:  The patient's family history includes COPD in his father and mother; Diabetes in his father and mother; Heart Problems in his father; Hypertension in his father and mother; Lung cancer in his mother.    ROS:  General:no colds or fevers, no weight changes Skin:no rashes or ulcers HEENT:no blurred vision, no congestion CV:see HPI PUL:see HPI GI:no diarrhea constipation or melena, no indigestion GU:no hematuria, no dysuria MS:no joint pain, no claudication Neuro:no syncope, no lightheadedness Endo:+ diabetes, no thyroid disease  Wt Readings from Last 3 Encounters:  05/31/21 223 lb 6.4 oz (101.3 kg)  05/23/21 221 lb 3.2 oz (100.3 kg)  05/14/21 218 lb (98.9 kg)     PHYSICAL EXAM: VS:  BP 112/74   Pulse 66   Ht 5' 6" (1.676 m)   Wt 223 lb 6.4 oz (101.3 kg)   SpO2 96%   BMI 36.06 kg/m  , BMI Body mass index is 36.06  kg/m. General:Pleasant affect, NAD Skin:Warm and dry, brisk capillary refill HEENT:normocephalic, sclera clear, mucus membranes moist Neck:supple, no JVD, no bruits  Heart:S1S2 RRR without murmur, gallup, rub or click Lungs:clear without rales, rhonchi, or wheezes Abd:soft, non tender, + BS, do not palpate liver spleen or masses Ext:no lower ext edema, 2+ pedal pulses, 2+ radial pulses Neuro:alert and oriented X 3, MAE, follows commands, + facial symmetry    EKG:  EKG is ordered today. The ekg ordered today demonstrates SR at 66 ant and inf T wave inversions. No change from prior recent EKG.    Recent Labs: 08/21/2020: B Natriuretic Peptide 106.4 11/06/2020: Hemoglobin 12.6; Platelets 257 01/11/2021: ALT 8 04/11/2021: BUN 11; Creatinine, Ser 1.39; Potassium 4.0; Sodium 141; TSH 1.720    Lipid Panel    Component Value Date/Time   CHOL 97 (L) 01/11/2021 1104   TRIG 172 (H) 01/11/2021 1104   HDL 27 (L) 01/11/2021 1104   CHOLHDL 3.6 01/11/2021 1104   CHOLHDL 2.6 06/10/2016 1253   VLDL 18 06/10/2016 1253   LDLCALC 41 01/11/2021 1104       Other studies Reviewed: Additional studies/ records that were reviewed today include:  Stress test.  05/14/21 .   Findings are consistent with prior myocardial infarction with peri-infarct ischemia. The study is intermediate risk.   No ST deviation was noted.   LV perfusion is abnormal. There is no evidence of ischemia. There is evidence   of infarction. Defect 1: There is a medium defect with moderate reduction in uptake present in the apical to mid inferior and apex location(s) that is fixed. There is abnormal wall motion in the defect area. Consistent with infarction and peri-infarct ischemia.   Left ventricular function is abnormal. Global function is moderately reduced. Nuclear stress EF: 36 %. The left ventricular ejection fraction is moderately decreased (30-44%). End diastolic cavity size is mildly enlarged. End systolic cavity size is mildly  enlarged.   Prior study available for comparison from 01/01/2017. No changes compared to prior study. Nuclear stress EF: 44%. There was no ST segment deviation noted during stress. Defect 1: There is a small defect of severe severity present in the apical septal and apex location. This is a low risk study. The left ventricular ejection fraction is moderately decreased (30-44%).   Low risk stress nuclear study with small prior distal septal/apical infarct; no ischemia; EF 44 with global hypokinesis and mild LVE.   Moderate size and intensity partially reversible inferoapical, apical and apical lateral perfusion defect suggestive of scar with minimal peri-infarct ischemia (SDS 3). LVEF 36% with distal apical, apical lateral and apical hypokinesis. This is an intermediate risk study. Compared to a prior study in 2018, the LVEF is lower (was 44%), however, the perfusion defect is similar.  Echo 08/21/20  IMPRESSIONS     1. Left ventricular ejection fraction, by estimation, is 50 to 55%. The  left ventricle has low normal function. The left ventricle has no regional  wall motion abnormalities. The left ventricular internal cavity size was  moderately to severely dilated.  There is moderate left ventricular hypertrophy. Left ventricular diastolic  parameters are indeterminate.   2. Right ventricular systolic function is normal. The right ventricular  size is normal. There is normal pulmonary artery systolic pressure.   3. The mitral valve is normal in structure. Trivial mitral valve  regurgitation. No evidence of mitral stenosis.   4. The aortic valve is tricuspid. Aortic valve regurgitation is not  visualized. No aortic stenosis is present.   5. The inferior vena cava is normal in size with greater than 50%  respiratory variability, suggesting right atrial pressure of 3 mmHg.   Comparison(s): Changes from prior study are noted.   Conclusion(s)/Recommendation(s): EF improved from prior study.    FINDINGS   Left Ventricle: Left ventricular ejection fraction, by estimation, is 50  to 55%. The left ventricle has low normal function. The left ventricle has  no regional wall motion abnormalities. The left ventricular internal  cavity size was moderately to  severely dilated. There is moderate left ventricular hypertrophy. Left  ventricular diastolic parameters are indeterminate.   Right Ventricle: The right ventricular size is normal. No increase in  right ventricular wall thickness. Right ventricular systolic function is  normal. There is normal pulmonary artery systolic pressure. The tricuspid  regurgitant velocity is 2.39 m/s, and   with an assumed right atrial pressure of 3 mmHg, the estimated right  ventricular systolic pressure is 25.8 mmHg.   Left Atrium: Left atrial size was normal in size.   Right Atrium: Right atrial size was normal in size.   Pericardium: Trivial pericardial effusion is present.   Mitral Valve: The mitral valve is normal in structure. Trivial mitral  valve regurgitation. No evidence of mitral valve stenosis.   Tricuspid Valve: The tricuspid valve is normal in structure. Tricuspid  valve regurgitation is trivial. No evidence of tricuspid stenosis.   Aortic Valve: The aortic valve   is tricuspid. Aortic valve regurgitation is  not visualized. No aortic stenosis is present.   Pulmonic Valve: The pulmonic valve was not well visualized. Pulmonic valve  regurgitation is not visualized.   Aorta: The aortic root, ascending aorta and aortic arch are all  structurally normal, with no evidence of dilitation or obstruction.   Venous: The inferior vena cava is normal in size with greater than 50%  respiratory variability, suggesting right atrial pressure of 3 mmHg.   IAS/Shunts: The atrial septum is grossly normal.      LEFT VENTRICLE  PLAX 2D  LVIDd:         5.80 cm      Diastology  LVIDs:         4.00 cm      LV e' medial:    3.48 cm/s  LV PW:          1.30 cm      LV E/e' medial:  16.9  LV IVS:        1.40 cm      LV e' lateral:   5.66 cm/s  LVOT diam:     2.40 cm      LV E/e' lateral: 10.4  LV SV:         69  LV SV Index:   33  LVOT Area:     4.52 cm   ASSESSMENT AND PLAN:  1.  More freq episodes of chest pain and SOB with abnormal EKG and abnormal Stress test.  Dr. Turner has reviewed and would like cardiac cath without LV gram (Hx of renal failure with sepsis)   he will hold lasix night before procedure and day of procedure.    The patient understands that risks included but are not limited to stroke (1 in 1000), death (1 in 1000), kidney failure [usually temporary] (1 in 500), bleeding (1 in 200), allergic reaction [possibly serious] (1 in 200). Pt agreeable to proceed.  Follow up 2 weeks post cath with Dr. Turner or APP. Pt preferred to wait until week on 06/10/21 for procedure, he understands that if symptoms increase he should go to ER.  2.  Hx of non obstructive coronary artery disease - but now with new symptoms and abnormal studies.  3.  NICM in past with improvement of  EF to 50-55% in 08/2020 and now drop in EF on stress test to 38%.  4.  CKD 3 last Cr was 1.39 will recheck before cath.  Will hold lasix as above.  5.  HLD on lipitor 40 mg last lipids were in June LDL 41 and HDL 27 continue statin.  6.  DM-2 on insulin, will take half the amount before cath.   7.  OSA per Dr. Turner tolerating PAP therapy.    8.  HTN controlled continue coreg, lasix hydralazine and amlodipine.   9.  PAF  hx of RFCA in 2017 and maintainig SR.  He was on xarelto in past but stopped due to anemia.          Current medicines are reviewed with the patient today.  The patient Has no concerns regarding medicines.  The following changes have been made:  See above Labs/ tests ordered today include:see above  Disposition:   FU:  see above  Signed, Jedadiah Abdallah, NP  05/31/2021 2:50 PM    [facility]  [facility] [address], [place], [place]  [address]/ [address] [place], [place] Phone: [phone]; Fax: [phone]  [phone]  

## 2021-05-31 NOTE — Patient Instructions (Signed)
Medication Instructions:  Your physician recommends that you continue on your current medications as directed. Please refer to the Current Medication list given to you today.  *If you need a refill on your cardiac medications before your next appointment, please call your pharmacy*   Lab Work: TODAY: CBC, BMET  If you have labs (blood work) drawn today and your tests are completely normal, you will receive your results only by: [place] (if you have MyChart) OR A paper copy in the mail If you have any lab test that is abnormal or we need to change your treatment, we will call you to review the results.   Testing/Procedures: Your physician has requested that you have a cardiac catheterization. Cardiac catheterization is used to diagnose and/or treat various heart conditions. Doctors may recommend this procedure for a number of different reasons. The most common reason is to evaluate chest pain. Chest pain can be a symptom of coronary artery disease (CAD), and cardiac catheterization can show whether plaque is narrowing or blocking your heart's arteries. This procedure is also used to evaluate the valves, as well as measure the blood flow and oxygen levels in different parts of your heart.  Please follow instruction sheet, as given.    Follow-Up: At [facility], you and your health needs are our priority.  As part of our continuing mission to provide you with exceptional heart care, we have created designated Provider Care Teams.  These Care Teams include your primary Cardiologist (physician) and Advanced Practice Providers (APPs -  Physician Assistants and Nurse Practitioners) who all work together to provide you with the care you need, when you need it.  Your next appointment:   2 week(s)  The format for your next appointment:   In Person  Provider:   You may see [name], MD or one of the following Advanced Practice Providers on your designated Care Team:   [name],  [name] Ermalinda Barrios, [name]   Other Instructions  Marco [name]  05/31/2021  You are scheduled for a Cardiac Catheterization on Tuesday, November 1 with Dr. [name].  1. Please arrive at the [facility] (Main Entrance A) at [facility]: [address] [place], [place] [address] at 7:00 AM (This time is two hours before your procedure to ensure your preparation). Free valet parking service is available.   Special note: Every effort is made to have your procedure done on time. Please understand that emergencies sometimes delay scheduled procedures.  2. Diet: Do not eat solid foods after midnight.  The patient may have clear liquids until 5am upon the day of the procedure.  3. Labs: You will need to have blood drawn on TODAY  4. Medication instructions in preparation for your procedure:   Contrast Allergy: No    DO NOT take furosemide (Lasix) on the day of the procedure.   Take half your dose of insulin on the night prior to procedure.  DO NOT take any insulin on the day of the procedure.     On the morning of your procedure, take your Aspirin 81 mg and any morning medicines NOT listed above.  You may use sips of water.  5. Plan for one night stay--bring personal belongings. 6. Bring a current list of your medications and current insurance cards. 7. You MUST have a responsible person to drive you home. 8. Someone MUST be with you the first 24 hours after you arrive home or your discharge will be delayed. 9.  Please wear clothes that are easy to get on and off and wear slip-on shoes.  Thank you for allowing [place] to care for you!   -- [facility] [place] [facility]

## 2021-06-01 LAB — CBC
Hematocrit: 36.1 % — ABNORMAL LOW (ref 37.5–51.0)
Hemoglobin: 12.4 g/dL — ABNORMAL LOW (ref 13.0–17.7)
MCH: 30.9 pg (ref 26.6–33.0)
MCHC: 34.3 g/dL (ref 31.5–35.7)
MCV: 90 fL (ref 79–97)
Platelets: 257 10*3/uL (ref 150–450)
RBC: 4.01 x10E6/uL — ABNORMAL LOW (ref 4.14–5.80)
RDW: 12.3 % (ref 11.6–15.4)
WBC: 7.9 10*3/uL (ref 3.4–10.8)

## 2021-06-01 LAB — BASIC METABOLIC PANEL
BUN/Creatinine Ratio: 9 — ABNORMAL LOW (ref 10–24)
BUN: 14 mg/dL (ref 8–27)
CO2: 25 mmol/L (ref 20–29)
Calcium: 8.9 mg/dL (ref 8.6–10.2)
Chloride: 100 mmol/L (ref 96–106)
Creatinine, Ser: 1.5 mg/dL — ABNORMAL HIGH (ref 0.76–1.27)
Glucose: 287 mg/dL — ABNORMAL HIGH (ref 70–99)
Potassium: 4 mmol/L (ref 3.5–5.2)
Sodium: 140 mmol/L (ref 134–144)
eGFR: 51 mL/min/{1.73_m2} — ABNORMAL LOW (ref >59)

## 2021-06-03 ENCOUNTER — Other Ambulatory Visit (HOSPITAL_COMMUNITY): Payer: Self-pay

## 2021-06-03 ENCOUNTER — Telehealth: Payer: Self-pay

## 2021-06-03 NOTE — Telephone Encounter (Signed)
-----   Message from Isaiah Serge, NP sent at 06/02/2021  8:16 PM EDT ----- Glucose is elevated, but otherwise ok for cath.  Hold lasix night before cath and day of cath to protect kidneys.    Cecilie Kicks, FNP-C

## 2021-06-03 NOTE — Telephone Encounter (Signed)
Pt is keeping cath as scheduled ./cy

## 2021-06-03 NOTE — Telephone Encounter (Signed)
Patient calling back. He states it is very important. He states to not change the date for him yet and to leave it on the 1st.

## 2021-06-03 NOTE — Telephone Encounter (Addendum)
Left a message for the pt to call back re: rescheduling his cath appt..   *After reviewing his chart and med history... LM for the pt to urge him not to reschedule his cath to a later date.    Glucose is elevated, but otherwise ok for cath.  Hold lasix night before cath and day of cath to protect kidneys.     Cecilie Kicks, FNP-C

## 2021-06-03 NOTE — Telephone Encounter (Signed)
Spoke with the pt and he verbalizes understanding of his lab results.... he is asking to have his Cath for 06/11/21 rescheduled... he says he has an appt that week that he cannot miss.   I advised him that I will see what I can do he is asking for the week of 06/17/21.

## 2021-06-04 ENCOUNTER — Other Ambulatory Visit (HOSPITAL_COMMUNITY): Payer: Self-pay

## 2021-06-06 ENCOUNTER — Other Ambulatory Visit: Payer: Self-pay | Admitting: Endocrinology

## 2021-06-06 ENCOUNTER — Other Ambulatory Visit (HOSPITAL_COMMUNITY): Payer: Self-pay

## 2021-06-06 DIAGNOSIS — Z794 Long term (current) use of insulin: Secondary | ICD-10-CM

## 2021-06-06 DIAGNOSIS — E1159 Type 2 diabetes mellitus with other circulatory complications: Secondary | ICD-10-CM

## 2021-06-06 MED ORDER — ALLOPURINOL 100 MG PO TABS
200.0000 mg | ORAL_TABLET | Freq: Every day | ORAL | 0 refills | Status: DC
  Filled 2021-06-06: qty 60, 30d supply, fill #0

## 2021-06-06 MED ORDER — UNIFINE PENTIPS 32G X 4 MM MISC
Freq: Every day | 0 refills | Status: DC
Start: 2021-06-06 — End: 2021-09-23
  Filled 2021-06-06: qty 100, 90d supply, fill #0

## 2021-06-06 MED FILL — Tafamidis Cap 61 MG: ORAL | 30 days supply | Qty: 30 | Fill #6 | Status: AC

## 2021-06-07 ENCOUNTER — Other Ambulatory Visit (HOSPITAL_COMMUNITY): Payer: Self-pay

## 2021-06-07 DIAGNOSIS — L309 Dermatitis, unspecified: Secondary | ICD-10-CM | POA: Diagnosis not present

## 2021-06-07 DIAGNOSIS — Z136 Encounter for screening for cardiovascular disorders: Secondary | ICD-10-CM | POA: Diagnosis not present

## 2021-06-10 ENCOUNTER — Telehealth: Payer: Self-pay | Admitting: *Deleted

## 2021-06-10 DIAGNOSIS — R2681 Unsteadiness on feet: Secondary | ICD-10-CM | POA: Diagnosis not present

## 2021-06-10 DIAGNOSIS — G4700 Insomnia, unspecified: Secondary | ICD-10-CM | POA: Diagnosis not present

## 2021-06-10 DIAGNOSIS — R269 Unspecified abnormalities of gait and mobility: Secondary | ICD-10-CM | POA: Diagnosis not present

## 2021-06-10 DIAGNOSIS — G4733 Obstructive sleep apnea (adult) (pediatric): Secondary | ICD-10-CM | POA: Diagnosis not present

## 2021-06-10 NOTE — Telephone Encounter (Addendum)
Cardiac catheterization scheduled at [facility] for: Tuesday June 11, 2021 [facility] Main Entrance [facility]) at: 7 AM   No solid food after midnight prior to cath, clear liquids until 5 AM day of procedure.  Hold: Lasix/KCl-PM prior/AM of procedure -GFR 51 Insulin-AM of procedure  Usual morning medications can be taken pre-cath with sips of water including aspirin 81 mg.    Confirmed patient has responsible adult to drive home post procedure and be with patient first 24 hours after arriving home.  [facility] does allow one visitor to accompany you and wait in the hospital waiting room while you are there for your procedure. You and your visitor will be asked to wear a mask once you enter the hospital.   Patient reports does not currently have any new symptoms concerning for COVID-19 and no household members with COVID-19 like illness.   Reviewed procedure/mask/visitor instructions with patient.

## 2021-06-10 NOTE — Telephone Encounter (Signed)
Follow Up:      Patient is returning your call. 

## 2021-06-10 NOTE — Telephone Encounter (Signed)
Reviewed procedure/mask/visitor instructions with patient. 

## 2021-06-11 ENCOUNTER — Other Ambulatory Visit (HOSPITAL_COMMUNITY): Payer: Self-pay

## 2021-06-11 ENCOUNTER — Encounter (HOSPITAL_COMMUNITY)
Admission: RE | Disposition: A | Discharge status: Home / Self Care | Payer: Self-pay | Source: Ambulatory Visit | Attending: Interventional Cardiology

## 2021-06-11 ENCOUNTER — Other Ambulatory Visit: Payer: Self-pay

## 2021-06-11 ENCOUNTER — Ambulatory Visit (HOSPITAL_COMMUNITY)
Admission: RE | Admit: 2021-06-11 | Discharge: 2021-06-11 | Disposition: A | Discharge status: Home / Self Care | Payer: Medicare Other | Source: Ambulatory Visit | Attending: Interventional Cardiology | Admitting: Interventional Cardiology

## 2021-06-11 DIAGNOSIS — Z7982 Long term (current) use of aspirin: Secondary | ICD-10-CM | POA: Diagnosis not present

## 2021-06-11 DIAGNOSIS — R9439 Abnormal result of other cardiovascular function study: Secondary | ICD-10-CM | POA: Diagnosis not present

## 2021-06-11 DIAGNOSIS — I10 Essential (primary) hypertension: Secondary | ICD-10-CM

## 2021-06-11 DIAGNOSIS — Z79899 Other long term (current) drug therapy: Secondary | ICD-10-CM | POA: Diagnosis not present

## 2021-06-11 DIAGNOSIS — I2510 Atherosclerotic heart disease of native coronary artery without angina pectoris: Secondary | ICD-10-CM

## 2021-06-11 DIAGNOSIS — E118 Type 2 diabetes mellitus with unspecified complications: Secondary | ICD-10-CM

## 2021-06-11 DIAGNOSIS — D649 Anemia, unspecified: Secondary | ICD-10-CM | POA: Diagnosis not present

## 2021-06-11 DIAGNOSIS — I480 Paroxysmal atrial fibrillation: Secondary | ICD-10-CM | POA: Insufficient documentation

## 2021-06-11 DIAGNOSIS — Z794 Long term (current) use of insulin: Secondary | ICD-10-CM | POA: Diagnosis not present

## 2021-06-11 DIAGNOSIS — Z87891 Personal history of nicotine dependence: Secondary | ICD-10-CM | POA: Insufficient documentation

## 2021-06-11 DIAGNOSIS — N183 Chronic kidney disease, stage 3 unspecified: Secondary | ICD-10-CM | POA: Diagnosis not present

## 2021-06-11 DIAGNOSIS — M069 Rheumatoid arthritis, unspecified: Secondary | ICD-10-CM | POA: Diagnosis present

## 2021-06-11 DIAGNOSIS — E785 Hyperlipidemia, unspecified: Secondary | ICD-10-CM | POA: Diagnosis not present

## 2021-06-11 DIAGNOSIS — I428 Other cardiomyopathies: Secondary | ICD-10-CM | POA: Diagnosis not present

## 2021-06-11 DIAGNOSIS — R079 Chest pain, unspecified: Secondary | ICD-10-CM

## 2021-06-11 DIAGNOSIS — I25119 Atherosclerotic heart disease of native coronary artery with unspecified angina pectoris: Secondary | ICD-10-CM | POA: Diagnosis not present

## 2021-06-11 DIAGNOSIS — Z886 Allergy status to analgesic agent status: Secondary | ICD-10-CM | POA: Insufficient documentation

## 2021-06-11 DIAGNOSIS — I4891 Unspecified atrial fibrillation: Secondary | ICD-10-CM | POA: Diagnosis present

## 2021-06-11 DIAGNOSIS — G8929 Other chronic pain: Secondary | ICD-10-CM | POA: Diagnosis not present

## 2021-06-11 DIAGNOSIS — Z7901 Long term (current) use of anticoagulants: Secondary | ICD-10-CM | POA: Insufficient documentation

## 2021-06-11 DIAGNOSIS — I5042 Chronic combined systolic (congestive) and diastolic (congestive) heart failure: Secondary | ICD-10-CM | POA: Insufficient documentation

## 2021-06-11 DIAGNOSIS — Z955 Presence of coronary angioplasty implant and graft: Secondary | ICD-10-CM

## 2021-06-11 DIAGNOSIS — E1122 Type 2 diabetes mellitus with diabetic chronic kidney disease: Secondary | ICD-10-CM | POA: Insufficient documentation

## 2021-06-11 DIAGNOSIS — G4733 Obstructive sleep apnea (adult) (pediatric): Secondary | ICD-10-CM | POA: Diagnosis not present

## 2021-06-11 DIAGNOSIS — I130 Hypertensive heart and chronic kidney disease with heart failure and stage 1 through stage 4 chronic kidney disease, or unspecified chronic kidney disease: Secondary | ICD-10-CM | POA: Insufficient documentation

## 2021-06-11 HISTORY — PX: LEFT HEART CATH AND CORONARY ANGIOGRAPHY: CATH118249

## 2021-06-11 HISTORY — PX: CORONARY STENT INTERVENTION: CATH118234

## 2021-06-11 LAB — BASIC METABOLIC PANEL
Anion gap: 9 (ref 5–15)
BUN: 13 mg/dL (ref 8–23)
CO2: 26 mmol/L (ref 22–32)
Calcium: 8.5 mg/dL — ABNORMAL LOW (ref 8.9–10.3)
Chloride: 103 mmol/L (ref 98–111)
Creatinine, Ser: 1.65 mg/dL — ABNORMAL HIGH (ref 0.61–1.24)
GFR, Estimated: 46 mL/min — ABNORMAL LOW (ref >60)
Glucose, Bld: 133 mg/dL — ABNORMAL HIGH (ref 70–99)
Potassium: 3.8 mmol/L (ref 3.5–5.1)
Sodium: 138 mmol/L (ref 135–145)

## 2021-06-11 LAB — GLUCOSE, CAPILLARY: Glucose-Capillary: 124 mg/dL — ABNORMAL HIGH (ref 70–99)

## 2021-06-11 LAB — POCT ACTIVATED CLOTTING TIME: Activated Clotting Time: 318 seconds

## 2021-06-11 SURGERY — LEFT HEART CATH AND CORONARY ANGIOGRAPHY
Anesthesia: LOCAL

## 2021-06-11 MED ORDER — AMLODIPINE BESYLATE 5 MG PO TABS: 5.0000 mg | ORAL_TABLET | Freq: Every evening | ORAL | Status: DC

## 2021-06-11 MED ORDER — SODIUM CHLORIDE 0.9 % IV SOLN: INTRAVENOUS | Status: AC

## 2021-06-11 MED ORDER — GABAPENTIN 300 MG PO CAPS: 300.0000 mg | ORAL_CAPSULE | Freq: Every day | ORAL | Status: DC

## 2021-06-11 MED ORDER — INSULIN LISPRO PROT & LISPRO (50-50 MIX) 100 UNIT/ML KWIKPEN: 95.0000 [IU] | PEN_INJECTOR | Freq: Every day | SUBCUTANEOUS | Status: DC

## 2021-06-11 MED ORDER — OXYCODONE-ACETAMINOPHEN 5-325 MG PO TABS: 1.0000 | ORAL_TABLET | Freq: Four times a day (QID) | ORAL | Status: DC

## 2021-06-11 MED ORDER — LORATADINE 10 MG PO TABS: 10.0000 mg | ORAL_TABLET | Freq: Every day | ORAL | Status: DC

## 2021-06-11 MED ORDER — ACITRETIN 25 MG PO CAPS: 25.0000 mg | ORAL_CAPSULE | Freq: Every evening | ORAL | Status: DC

## 2021-06-11 MED ORDER — ASPIRIN 81 MG PO TBEC: 81.0000 mg | DELAYED_RELEASE_TABLET | Freq: Every day | ORAL | 12 refills | Status: AC

## 2021-06-11 MED ORDER — MELATONIN 5 MG PO TABS: 5.0000 mg | ORAL_TABLET | Freq: Every day | ORAL | Status: DC

## 2021-06-11 MED ORDER — NITROGLYCERIN 0.4 MG SL SUBL
0.4000 mg | SUBLINGUAL_TABLET | SUBLINGUAL | 2 refills | Status: AC | PRN
  Filled 2021-06-11: qty 25, 7d supply, fill #0

## 2021-06-11 MED ORDER — LIDOCAINE HCL (PF) 1 % IJ SOLN
INTRAMUSCULAR | Status: AC
  Filled 2021-06-11: qty 30

## 2021-06-11 MED ORDER — NITROGLYCERIN 1 MG/10 ML FOR IR/CATH LAB
INTRA_ARTERIAL | Status: AC
  Filled 2021-06-11: qty 10

## 2021-06-11 MED ORDER — HEPARIN (PORCINE) IN NACL 1000-0.9 UT/500ML-% IV SOLN
INTRAVENOUS | Status: DC | PRN
  Administered 2021-06-11 (×2): 500 mL

## 2021-06-11 MED ORDER — VERAPAMIL HCL 2.5 MG/ML IV SOLN
INTRAVENOUS | Status: DC | PRN
  Administered 2021-06-11: 10 mL via INTRA_ARTERIAL

## 2021-06-11 MED ORDER — SODIUM CHLORIDE 0.9 % IV SOLN: 250.0000 mL | INTRAVENOUS | Status: DC | PRN

## 2021-06-11 MED ORDER — ALLOPURINOL 100 MG PO TABS: 200.0000 mg | ORAL_TABLET | Freq: Every evening | ORAL | Status: DC

## 2021-06-11 MED ORDER — ISOSORBIDE MONONITRATE ER 60 MG PO TB24: 90.0000 mg | ORAL_TABLET | Freq: Every day | ORAL | Status: DC

## 2021-06-11 MED ORDER — HEPARIN (PORCINE) IN NACL 1000-0.9 UT/500ML-% IV SOLN
INTRAVENOUS | Status: AC
  Filled 2021-06-11: qty 1000

## 2021-06-11 MED ORDER — HYDRALAZINE HCL 20 MG/ML IJ SOLN: 10.0000 mg | INTRAMUSCULAR | Status: DC | PRN

## 2021-06-11 MED ORDER — FUROSEMIDE 40 MG PO TABS: 40.0000 mg | ORAL_TABLET | Freq: Every evening | ORAL | Status: DC

## 2021-06-11 MED ORDER — HEPARIN SODIUM (PORCINE) 1000 UNIT/ML IJ SOLN
INTRAMUSCULAR | Status: AC
  Filled 2021-06-11: qty 1

## 2021-06-11 MED ORDER — ONDANSETRON HCL 4 MG/2ML IJ SOLN: 4.0000 mg | Freq: Four times a day (QID) | INTRAMUSCULAR | Status: DC | PRN

## 2021-06-11 MED ORDER — VERAPAMIL HCL 2.5 MG/ML IV SOLN
INTRAVENOUS | Status: AC
  Filled 2021-06-11: qty 2

## 2021-06-11 MED ORDER — FLUTICASONE PROPIONATE 50 MCG/ACT NA SUSP: 1.0000 | Freq: Every day | NASAL | Status: DC

## 2021-06-11 MED ORDER — MIDAZOLAM HCL 2 MG/2ML IJ SOLN
INTRAMUSCULAR | Status: DC | PRN
  Administered 2021-06-11: 2 mg via INTRAVENOUS

## 2021-06-11 MED ORDER — SODIUM CHLORIDE 0.9% FLUSH: 3.0000 mL | INTRAVENOUS | Status: DC | PRN

## 2021-06-11 MED ORDER — CLOPIDOGREL BISULFATE 300 MG PO TABS
ORAL_TABLET | ORAL | Status: AC
  Filled 2021-06-11: qty 1

## 2021-06-11 MED ORDER — ASPIRIN 81 MG PO CHEW: 81.0000 mg | CHEWABLE_TABLET | ORAL | Status: DC

## 2021-06-11 MED ORDER — TAFAMIDIS 61 MG PO CAPS: 61.0000 mg | ORAL_CAPSULE | Freq: Every day | ORAL | Status: DC

## 2021-06-11 MED ORDER — SODIUM CHLORIDE 0.9% FLUSH: 3.0000 mL | Freq: Two times a day (BID) | INTRAVENOUS | Status: DC

## 2021-06-11 MED ORDER — FENTANYL CITRATE (PF) 100 MCG/2ML IJ SOLN
INTRAMUSCULAR | Status: AC
  Filled 2021-06-11: qty 2

## 2021-06-11 MED ORDER — LABETALOL HCL 5 MG/ML IV SOLN: 10.0000 mg | INTRAVENOUS | Status: DC | PRN

## 2021-06-11 MED ORDER — DULOXETINE HCL 20 MG PO CPEP: 20.0000 mg | ORAL_CAPSULE | Freq: Every day | ORAL | Status: DC

## 2021-06-11 MED ORDER — OXYMETAZOLINE HCL 0.05 % NA SOLN: 1.0000 | Freq: Two times a day (BID) | NASAL | Status: DC

## 2021-06-11 MED ORDER — CLOPIDOGREL BISULFATE 75 MG PO TABS
75.0000 mg | ORAL_TABLET | Freq: Every day | ORAL | 5 refills | Status: AC
  Filled 2021-06-11: qty 30, 30d supply, fill #0
  Filled 2021-07-08: qty 30, 30d supply, fill #1
  Filled 2021-07-29: qty 30, 30d supply, fill #2
  Filled 2021-09-02: qty 30, 30d supply, fill #3
  Filled 2021-09-23: qty 30, 30d supply, fill #4

## 2021-06-11 MED ORDER — HEPARIN SODIUM (PORCINE) 1000 UNIT/ML IJ SOLN
INTRAMUSCULAR | Status: DC | PRN
  Administered 2021-06-11: 12000 [IU] via INTRAVENOUS

## 2021-06-11 MED ORDER — LIDOCAINE HCL (PF) 1 % IJ SOLN
INTRAMUSCULAR | Status: DC | PRN
  Administered 2021-06-11: 2 mL

## 2021-06-11 MED ORDER — FENTANYL CITRATE (PF) 100 MCG/2ML IJ SOLN
INTRAMUSCULAR | Status: DC | PRN
  Administered 2021-06-11: 25 ug via INTRAVENOUS

## 2021-06-11 MED ORDER — MIDAZOLAM HCL 2 MG/2ML IJ SOLN
INTRAMUSCULAR | Status: AC
  Filled 2021-06-11: qty 2

## 2021-06-11 MED ORDER — ASPIRIN 81 MG PO CHEW: 81.0000 mg | CHEWABLE_TABLET | Freq: Every day | ORAL | Status: DC

## 2021-06-11 MED ORDER — POTASSIUM CHLORIDE CRYS ER 10 MEQ PO TBCR: 10.0000 meq | EXTENDED_RELEASE_TABLET | ORAL | Status: DC

## 2021-06-11 MED ORDER — CARVEDILOL 12.5 MG PO TABS
12.5000 mg | ORAL_TABLET | Freq: Two times a day (BID) | ORAL | Status: DC
Start: 2021-06-11 — End: 2021-06-11

## 2021-06-11 MED ORDER — ATORVASTATIN CALCIUM 40 MG PO TABS: 40.0000 mg | ORAL_TABLET | Freq: Every day | ORAL | Status: DC

## 2021-06-11 MED ORDER — POLYETHYLENE GLYCOL 3350 17 G PO PACK: 17.0000 g | PACK | Freq: Every day | ORAL | Status: DC | PRN

## 2021-06-11 MED ORDER — TIRZEPATIDE 2.5 MG/0.5ML ~~LOC~~ SOAJ: 2.5000 mg | SUBCUTANEOUS | Status: DC

## 2021-06-11 MED ORDER — ACETAMINOPHEN 325 MG PO TABS: 650.0000 mg | ORAL_TABLET | ORAL | Status: DC | PRN

## 2021-06-11 MED ORDER — CLOPIDOGREL BISULFATE 300 MG PO TABS
ORAL_TABLET | ORAL | Status: DC | PRN
  Administered 2021-06-11: 600 mg via ORAL

## 2021-06-11 MED ORDER — TRIAMCINOLONE ACETONIDE 0.1 % EX CREA: 1.0000 "application " | TOPICAL_CREAM | Freq: Every day | CUTANEOUS | Status: DC

## 2021-06-11 MED ORDER — NITROGLYCERIN 1 MG/10 ML FOR IR/CATH LAB
INTRA_ARTERIAL | Status: DC | PRN
  Administered 2021-06-11: 400 ug via INTRA_ARTERIAL

## 2021-06-11 MED ORDER — SODIUM CHLORIDE 0.9 % IV SOLN: INTRAVENOUS | Status: DC

## 2021-06-11 MED ORDER — FUROSEMIDE 80 MG PO TABS: 80.0000 mg | ORAL_TABLET | Freq: Every morning | ORAL | Status: DC

## 2021-06-11 MED ORDER — CLOPIDOGREL BISULFATE 75 MG PO TABS: 75.0000 mg | ORAL_TABLET | Freq: Every day | ORAL | Status: DC

## 2021-06-11 MED ORDER — FAMOTIDINE 20 MG PO TABS: 20.0000 mg | ORAL_TABLET | Freq: Two times a day (BID) | ORAL | Status: DC

## 2021-06-11 MED ORDER — IOHEXOL 350 MG/ML SOLN
INTRAVENOUS | Status: DC | PRN
  Administered 2021-06-11: 75 mL

## 2021-06-11 MED ORDER — ASPIRIN 81 MG PO TBEC: 81.0000 mg | DELAYED_RELEASE_TABLET | Freq: Every evening | ORAL | Status: DC

## 2021-06-11 MED ORDER — ALBUTEROL SULFATE HFA 108 (90 BASE) MCG/ACT IN AERS: 2.0000 | INHALATION_SPRAY | Freq: Four times a day (QID) | RESPIRATORY_TRACT | Status: DC | PRN

## 2021-06-11 SURGICAL SUPPLY — 21 items
BALLN SAPPHIRE 2.0X12 (BALLOONS) ×2
BALLN ~~LOC~~ SAPPHIRE 4.5X8 (BALLOONS) ×2
BALLOON SAPPHIRE 2.0X12 (BALLOONS) ×1 IMPLANT
BALLOON ~~LOC~~ SAPPHIRE 4.5X8 (BALLOONS) ×1 IMPLANT
CATH 5FR JL3.5 JR4 ANG PIG MP (CATHETERS) ×2 IMPLANT
CATH LAUNCHER 6FR EBU3.5 (CATHETERS) ×2 IMPLANT
DEVICE RAD COMP TR BAND LRG (VASCULAR PRODUCTS) ×2 IMPLANT
GLIDESHEATH SLEND SS 6F .021 (SHEATH) ×2 IMPLANT
GUIDEWIRE INQWIRE 1.5J.035X260 (WIRE) ×1 IMPLANT
INQWIRE 1.5J .035X260CM (WIRE) ×2
KIT ENCORE 26 ADVANTAGE (KITS) ×2 IMPLANT
KIT HEART LEFT (KITS) ×2 IMPLANT
KIT HEMO VALVE WATCHDOG (MISCELLANEOUS) ×2 IMPLANT
PACK CARDIAC CATHETERIZATION (CUSTOM PROCEDURE TRAY) ×2 IMPLANT
STENT ONYX FRONTIER 4.0X12 (Permanent Stent) ×2 IMPLANT
SYR MEDRAD MARK 7 150ML (SYRINGE) ×2 IMPLANT
TRANSDUCER W/STOPCOCK (MISCELLANEOUS) ×2 IMPLANT
TUBING CIL FLEX 10 FLL-RA (TUBING) ×2 IMPLANT
WIRE ASAHI PROWATER 180CM (WIRE) ×2 IMPLANT
WIRE HI TORQ BMW 190CM (WIRE) ×2 IMPLANT
WIRE HI TORQ VERSACORE-J 145CM (WIRE) ×2 IMPLANT

## 2021-06-11 NOTE — Progress Notes (Signed)
Discussed stent, restrictions, Plavix importance, diet, exercise, signs of fluid retention, NTG, and CRPII. Pt receptive. Will refer to [place].  [place], ACSM 3:09 PM 06/11/2021

## 2021-06-11 NOTE — Interval H&P Note (Signed)
Cath Lab Visit (complete for each Cath Lab visit)  Clinical Evaluation Leading to the Procedure:   ACS: No.  Non-ACS:    Anginal Classification: CCS III  Anti-ischemic medical therapy: Minimal Therapy (1 class of medications)  Non-Invasive Test Results: Intermediate-risk stress test findings: cardiac mortality 1-3%/year  Prior CABG: No previous CABG      History and Physical Interval Note:  06/11/2021 9:38 AM  Marco [name]  has presented today for surgery, with the diagnosis of positive stress test.  The various methods of treatment have been discussed with the patient and family. After consideration of risks, benefits and other options for treatment, the patient has consented to  Procedure(s): LEFT HEART CATH AND CORONARY ANGIOGRAPHY (N/A) as a surgical intervention.  The patient's history has been reviewed, patient examined, no change in status, stable for surgery.  I have reviewed the patient's chart and labs.  Questions were answered to the patient's satisfaction.     Marco [name]

## 2021-06-11 NOTE — Discharge Summary (Signed)
Discharge Summary for Same Day PCI   Patient ID: DORNELL GRASMICK MRN: [id number]; DOB: 09-26-1955  Admit date: 06/11/2021 Discharge date: 06/11/2021  Primary Care Provider: [name], MD  Primary Cardiologist: [name], MD  Primary Electrophysiologist:  None   Discharge Diagnoses    Principal Problem:   Chest pain of uncertain etiology Active Problems:   Abnormal stress test   CAD (coronary artery disease)   Atrial fibrillation ([place])   Hypertension   Rheumatoid arthritis ([place])   Chronic pain   Type 2 diabetes mellitus with complication, with long-term current use of insulin (HCC)   Chronic combined systolic and diastolic CHF (congestive heart failure) ([place])   Hyperlipidemia    Diagnostic Studies/Procedures    Cardiac Catheterization 06/11/2021:   Ost Cx to Prox Cx lesion is 10% stenosed.   Mid LAD lesion is 75% stenosed.   A drug-eluting stent was successfully placed using a STENT ONYX FRONTIER 4.0X12,postdilated to 4.5 mm.  OCT not used due to renal insufficiency.   Post intervention, there is a 0% residual stenosis.   Ost 2nd Diag lesion is 75% stenosed.   Balloon angioplasty was performed using a BALLN SAPPHIRE 2.0X12.   Post intervention, there is a 10% residual stenosis.   LV end diastolic pressure is normal.   There is no aortic valve stenosis.   Tortuous right subclavian artery.   Dual antiplatelet therapy with clopidogrel for at least 6 months.  Diffuse, mild coronary artery disease present throughout the coronary tree.  He will need aggressive secondary prevention including lipid-lowering therapy, diabetes control, healthy diet and regular exercise.   Plan for same-day discharge. _____________  Diagnostic Dominance: Right   Intervention   History of Present Illness     SATVIK PARCO is a 65 y.o. male with mild non-obstructive CAD on cardiac catheterization in 2017, chronic combined CHF with normalization of EF to 50-55% in 08/2020,  persistent atrial fibrillation with prior ablation in [place] in 2017 no longer on anticoagulation given anemia, hypertension, hyperlipidemia, diabetes mellitus, obstructive sleep apnea on CPAP, CKD stage III, rheumatoid arthritis, and chronic pain who is followed by [name]. Patient was seen by [name] on 04/29/2021 at which time he reported intermittent chest pain and stable shortness of breath. EKG showed more prominent T wave changes in anterior leads. Imdur was increased and Myoview was ordered which showed a partially reversible inferoapical, apical, and apical lateral perfusion defect suggestive of scar with minimal peri-infarct ischemia but similar to prior study in 2018. Patient was seen by Cecilie Kicks, NP, on 06/03/2021 for follow-up of this and outpatient cardiac catheterization was arranged for further evaluation.  Hospital Course     Patient presented to [facility] on 06/11/2021 for planned outpatient cardiac catheterization. Cath showed 75% stenosis of the mid LAD and 75% stenosis of the ostial 2nd Diag. Patient underwent successful PCI with DES to the mid LAD and balloon angioplasty to the 2nd Diag lesion with 10% residual stenosis post intervention. Plan is for uninterrupted dual antiplatelet therapy with Aspirin $RemoveBefo'81mg'VYLtSEElefm$  daily and Plavix $RemoveBe'75mg'uNFdGEkuS$  daily.  The patient was seen by Cardiac Rehab while in short stay. There were no observed complications post cath. Right radial cath site was re-evaluated prior to discharge and found to be stable without any complications. Instructions/precautions regarding cath site care were given prior to discharge.  [name] was seen by Dr. Irish Lack and determined stable for discharge home. Follow up with our office has been arranged. Medications are listed  below. Pertinent changes include initiation of Plavix 75mg  daily. _____________  Cath/PCI Registry Performance & Quality Measures: Aspirin prescribed? - Yes ADP Receptor Inhibitor (Plavix/Clopidogrel,  Brilinta/Ticagrelor or Effient/Prasugrel) prescribed (includes medically managed patients)? - Yes High Intensity Statin (Lipitor 40-80mg  or Crestor 20-40mg ) prescribed? - Yes For EF <40%, was ACEI/ARB prescribed? - Yes For EF <40%, Aldosterone Antagonist (Spironolactone or Eplerenone) prescribed? - No - Reason:  EF 50-55% on Echo in 08/2020 but 36% on Myoview in 05/2021 (Myoview not always accurate) - can consider adding as outpatient if needed. Cardiac Rehab Phase II ordered (Included Medically managed Patients)? - Yes  _____________   Discharge Vitals Blood pressure (!) 123/57, pulse 65, temperature 98.4 F (36.9 C), resp. rate 10, height 5\' 6"  (1.676 m), weight 99.8 kg, SpO2 94 %.  Filed Weights   06/11/21 0739  Weight: 99.8 kg    Last Labs & Radiologic Studies    CBC No results for input(s): WBC, NEUTROABS, HGB, HCT, MCV, PLT in the last 72 hours. Basic Metabolic Panel Recent Labs    06/11/21 0742  NA 138  K 3.8  CL 103  CO2 26  GLUCOSE 133*  BUN 13  CREATININE 1.65*  CALCIUM 8.5*   Liver Function Tests No results for input(s): AST, ALT, ALKPHOS, BILITOT, PROT, ALBUMIN in the last 72 hours. No results for input(s): LIPASE, AMYLASE in the last 72 hours. High Sensitivity Troponin:   No results for input(s): TROPONINIHS in the last 720 hours.  BNP Invalid input(s): POCBNP D-Dimer No results for input(s): DDIMER in the last 72 hours. Hemoglobin A1C No results for input(s): HGBA1C in the last 72 hours. Fasting Lipid Panel No results for input(s): CHOL, HDL, LDLCALC, TRIG, CHOLHDL, LDLDIRECT in the last 72 hours. Thyroid Function Tests No results for input(s): TSH, T4TOTAL, T3FREE, THYROIDAB in the last 72 hours.  Invalid input(s): FREET3 _____________  CARDIAC CATHETERIZATION  Result Date: 06/11/2021   Ost Cx to Prox Cx lesion is 10% stenosed.   Mid LAD lesion is 75% stenosed.   A drug-eluting stent was successfully placed using a STENT ONYX FRONTIER  4.0X12,postdilated to 4.5 mm.  OCT not used due to renal insufficiency.   Post intervention, there is a 0% residual stenosis.   Ost 2nd Diag lesion is 75% stenosed.   Balloon angioplasty was performed using a BALLN SAPPHIRE 2.0X12.   Post intervention, there is a 10% residual stenosis.   LV end diastolic pressure is normal.   There is no aortic valve stenosis.   Tortuous right subclavian artery. Dual antiplatelet therapy with clopidogrel for at least 6 months.  Diffuse, mild coronary artery disease present throughout the coronary tree.  He will need aggressive secondary prevention including lipid-lowering therapy, diabetes control, healthy diet and regular exercise. Plan for same-day discharge.   MYOCARDIAL PERFUSION IMAGING  Result Date: 05/14/2021   Findings are consistent with prior myocardial infarction with peri-infarct ischemia. The study is intermediate risk.   No ST deviation was noted.   LV perfusion is abnormal. There is no evidence of ischemia. There is evidence of infarction. Defect 1: There is a medium defect with moderate reduction in uptake present in the apical to mid inferior and apex location(s) that is fixed. There is abnormal wall motion in the defect area. Consistent with infarction and peri-infarct ischemia.   Left ventricular function is abnormal. Global function is moderately reduced. Nuclear stress EF: 36 %. The left ventricular ejection fraction is moderately decreased (30-44%). End diastolic cavity size is mildly enlarged. End  systolic cavity size is mildly enlarged.   Prior study available for comparison from 01/01/2017. No changes compared to prior study. Nuclear stress EF: 44%. There was no ST segment deviation noted during stress. Defect 1: There is a small defect of severe severity present in the apical septal and apex location. This is a low risk study. The left ventricular ejection fraction is moderately decreased (30-44%).   Low risk stress nuclear study with small prior  distal septal/apical infarct; no ischemia; EF 44 with global hypokinesis and mild LVE. Moderate size and intensity partially reversible inferoapical, apical and apical lateral perfusion defect suggestive of scar with minimal peri-infarct ischemia (SDS 3). LVEF 36% with distal apical, apical lateral and apical hypokinesis. This is an intermediate risk study. Compared to a prior study in 2018, the LVEF is lower (was 44%), however, the perfusion defect is similar.    Disposition   Patient is being discharged home today in good condition.  Follow-up Plans & Appointments     Follow-up Information     Sueanne Margarita, MD Follow up.   Specialty: Cardiology Why: Please keep follow-up visit with [name] on 06/26/2021 at 11:40am. Contact information: [address] [place] [address] [phone]                Discharge Instructions     AMB Referral to Cardiac Rehabilitation - Phase II   Complete by: As directed    Diagnosis: Coronary Stents   After initial evaluation and assessments completed: Virtual Based Care may be provided alone or in conjunction with Phase 2 Cardiac Rehab based on patient barriers.: Yes        Discharge Medications   Allergies as of 06/11/2021       Reactions   Nsaids Other (See Comments)   Stomach pains. Ulcers - stated by patient    Jardiance [empagliflozin] Nausea And Vomiting        Medication List     STOP taking these medications    clobetasol cream 0.05 % Commonly known as: TEMOVATE   dextromethorphan-guaiFENesin 30-600 MG 12hr tablet Commonly known as: MUCINEX DM   eszopiclone 1 MG Tabs tablet Commonly known as: LUNESTA   fluocinonide cream 0.05 % Commonly known as: LIDEX   Kettle Neti Pot Sinus Wash 2300-700 MG Kit Generic drug: Sodium Chloride-Sodium Bicarb   mupirocin cream 2 % Commonly known as: Bactroban       TAKE these medications    12 Hour Decongestant 0.05 % nasal spray Generic drug:  oxymetazoline Place 1 spray into both nostrils 2 (two) times daily.   Accu-Chek Guide test strip Generic drug: glucose blood USE TO TEST 4 TIMES DAILY.   Accu-Chek Softclix Lancets lancets USE AS INSTRUCTED   acetaminophen 650 MG CR tablet Commonly known as: TYLENOL Take 1,300 mg by mouth in the morning and at bedtime.   acitretin 25 MG capsule Commonly known as: SORIATANE Take 2 capsules (50 mg total) by mouth daily. What changed:  how much to take when to take this   albuterol 108 (90 Base) MCG/ACT inhaler Commonly known as: VENTOLIN HFA INHALE 2 PUFFS INTO THE LUNGS EVERY 6 HOURS AS NEEDED FOR WHEEZING OR SHORTNESS OF BREATH.   allopurinol 100 MG tablet Commonly known as: ZYLOPRIM Take 2 tablets by mouth daily What changed: when to take this   amLODipine 5 MG tablet Commonly known as: NORVASC Take 1 tablet (5 mg total) by mouth daily. What changed: when to take this  aspirin 81 MG EC tablet Commonly known as: EQ Aspirin Adult Low Dose Take 1 tablet (81 mg total) by mouth daily. TAKE 1 TABLET BY MOUTH ONCE DAILY. SWALLOW WHOLE What changed: additional instructions   atorvastatin 40 MG tablet Commonly known as: LIPITOR Take 1 tablet (40 mg total) by mouth daily.   carvedilol 12.5 MG tablet Commonly known as: COREG TAKE 1 TABLET BY MOUTH TWO TIMES DAILY WITH MEALS   clopidogrel 75 MG tablet Commonly known as: Plavix Take 1 tablet (75 mg total) by mouth daily.   Cosentyx Sensoready Pen 150 MG/ML Soaj Generic drug: Secukinumab INJECT ONE PEN SUBCUTANEOUSLY EVERY 4 WEEKS. REFRIGERATE. ALLOW 15 TO 30 MINUTES AT ROOM TEMP PRIOR TO ADMINISTRATION.   Dexcom G6 Receiver Devi Use as instructed to monitor blood sugar daily   Dexcom G6 Sensor Misc Use as instructed to monitor blood sugar daily. Change every 10 days.   Dexcom G6 Transmitter Misc Use as instructed to monitor blood sugar daily. Change every 90 days.   diclofenac Sodium 1 % Gel Commonly known as:  Voltaren Apply 4 g topically 4 (four) times daily. What changed:  when to take this reasons to take this   DULoxetine 20 MG capsule Commonly known as: CYMBALTA TAKE 1 CAPSULE BY MOUTH ONCE A DAY   famotidine 20 MG tablet Commonly known as: PEPCID TAKE 1 TABLET BY MOUTH 2 TIMES DAILY   fexofenadine 180 MG tablet Commonly known as: ALLEGRA Take 180 mg by mouth every evening.   fluticasone 50 MCG/ACT nasal spray Commonly known as: FLONASE PLACE 1 SPRAY INTO BOTH NOSTRILS DAILY AS NEEDED FOR ALLERGIES OR RHINITIS. What changed:  how much to take how to take this when to take this   furosemide 40 MG tablet Commonly known as: Lasix Take 1 tablet (40 mg total) by mouth every evening.   furosemide 80 MG tablet Commonly known as: LASIX Take 1 tablet (80 mg total) by mouth every morning.   gabapentin 300 MG capsule Commonly known as: NEURONTIN Take 1 capsule (300 mg total) by mouth at bedtime.   HumaLOG Mix 50/50 KwikPen (50-50) 100 UNIT/ML Kwikpen Generic drug: Insulin Lispro Prot & Lispro Inject 95 Units into the skin daily with breakfast.   isosorbide mononitrate 60 MG 24 hr tablet Commonly known as: IMDUR Take 1.5 tablets (90 mg total) by mouth daily.   melatonin 5 MG Tabs Take 5 mg by mouth at bedtime.   metoprolol tartrate 100 MG tablet Commonly known as: LOPRESSOR Take 1 tablet (100 mg total) by mouth once for 1 dose.   Mounjaro 2.5 MG/0.5ML Pen Generic drug: tirzepatide Inject 1 pen (2.5 mg) into the skin once a week.   naloxone 4 MG/0.1ML Liqd nasal spray kit Commonly known as: NARCAN Place in nostril in the event of suspected overdose on Narcotic medication (Oxycodone)   nitroGLYCERIN 0.4 MG SL tablet Commonly known as: Nitrostat Place 1 tablet (0.4 mg total) under the tongue every 5 (five) minutes as needed for chest pain.   oxyCODONE-acetaminophen 5-325 MG tablet Commonly known as: PERCOCET/ROXICET Take 1 tablet by mouth 4 (four) times daily.    polyethylene glycol 17 g packet Commonly known as: MIRALAX / GLYCOLAX Take 17 g by mouth daily as needed for moderate constipation or severe constipation.   potassium chloride SA 20 MEQ tablet Commonly known as: KLOR-CON Take 1 and 1/2 tablets (30 mEq total) by mouth daily. What changed:  how much to take when to take this additional instructions  triamcinolone cream 0.1 % Commonly known as: KENALOG APPLY TO THE AFFECTED AREA(S) TOPICALLY 2 TIMES DAILY AS DIRECTED What changed:  how much to take how to take this when to take this   Unifine Pentips 32G X 4 MM Misc Generic drug: Insulin Pen Needle USE AS DIRECTED DAILY   Unifine Pentips 32G X 4 MM Misc Generic drug: Insulin Pen Needle USE AS DIRECTED DAILY   Vyndamax 61 MG Caps Generic drug: Tafamidis TAKE 1 CAPSULE (61 MG) BY MOUTH DAILY.           Allergies Allergies  Allergen Reactions   Nsaids Other (See Comments)    Stomach pains. Ulcers - stated by patient    Jardiance [Empagliflozin] Nausea And Vomiting    Outstanding Labs/Studies   Consider repeating BMET at follow-up visit.  Duration of Discharge Encounter   Greater than 30 minutes including physician time.  Signed, Darreld Mclean, [name] 06/11/2021, 2:10 PM

## 2021-06-11 NOTE — Discharge Instructions (Signed)
Post Cardiac Catheterization: NO HEAVY LIFTING OR SEXUAL ACTIVITY X 7 DAYS. NO DRIVING X 2-3 DAYS. NO SOAKING BATHS, HOT TUBS, POOLS, ETC., X 7 DAYS.  Radial Site Care: Refer to this sheet in the next few weeks. These instructions provide you with information on caring for yourself after your procedure. Your caregiver may also give you more specific instructions. Your treatment has been planned according to current medical practices, but problems sometimes occur. Call your caregiver if you have any problems or questions after your procedure. HOME CARE INSTRUCTIONS  You may shower the day after the procedure.Remove the bandage (dressing) and gently wash the site with plain soap and water.Gently pat the site dry.   Do not apply powder or lotion to the site.   Do not submerge the affected site in water for 3 to 5 days.   Inspect the site at least twice daily.   Do not flex or bend the affected arm for 24 hours.   No lifting over 5 pounds (2.3 kg) for 5 days after your procedure.   Do not drive home if you are discharged the same day of the procedure. Have someone else drive you.  What to expect:  Any bruising will usually fade within 1 to 2 weeks.   Blood that collects in the tissue (hematoma) may be painful to the touch. It should usually decrease in size and tenderness within 1 to 2 weeks.  SEEK IMMEDIATE MEDICAL CARE IF:  You have unusual pain at the radial site.   You have redness, warmth, swelling, or pain at the radial site.   You have drainage (other than a small amount of blood on the dressing).   You have chills.   You have a fever or persistent symptoms for more than 72 hours.   You have a fever and your symptoms suddenly get worse.   Your arm becomes pale, cool, tingly, or numb.   You have heavy bleeding from the site. Hold pressure on the site.   

## 2021-06-12 ENCOUNTER — Encounter (HOSPITAL_COMMUNITY): Payer: Self-pay | Admitting: Interventional Cardiology

## 2021-06-15 DIAGNOSIS — J9601 Acute respiratory failure with hypoxia: Secondary | ICD-10-CM | POA: Diagnosis not present

## 2021-06-17 ENCOUNTER — Other Ambulatory Visit (HOSPITAL_COMMUNITY): Payer: Self-pay

## 2021-06-18 ENCOUNTER — Telehealth: Payer: Self-pay | Admitting: Internal Medicine

## 2021-06-18 ENCOUNTER — Telehealth: Payer: Self-pay | Admitting: Cardiology

## 2021-06-18 DIAGNOSIS — G4733 Obstructive sleep apnea (adult) (pediatric): Secondary | ICD-10-CM

## 2021-06-18 NOTE — Telephone Encounter (Signed)
Patient had stent placed during cath and was placed on Plavix 75 mg daily. Advised patient that he will need to continue this medication for 6 months. Advised that if it is required that he hold his Plavix for surgery then he will need to wait until after the 6 months to have it done. Patient verbalized understanding.

## 2021-06-18 NOTE — Telephone Encounter (Signed)
Attempted phone call to pt.  Left voicemail message to contact office at [phone].

## 2021-06-18 NOTE — Telephone Encounter (Signed)
Marco [name] is calling wanting to confirm that Mickel Baas had advised him he should not have any procedures for 6 months after getting the Cath performed on 11/01. He states he is supposed to be having a Colonoscopy in December and wanted to make sure he does need to cancel before calling the performing office to cancel.

## 2021-06-18 NOTE — Telephone Encounter (Signed)
Pt states he cannot use CPAP unless the mask as tight as it can be. Pt states this results in a sore nose,imprint on face. Pt states his tongue gets so dry it's stuck to the roof of his mouth. Pt states even with sleeping meds, he isn't getting accurate sleep. Please advise [phone] Pt states he called [employer] and they said to call us.

## 2021-06-18 NOTE — Telephone Encounter (Signed)
I have called and LM on VM for the pt to call us back.  

## 2021-06-18 NOTE — Telephone Encounter (Signed)
Patient returning call.

## 2021-06-20 ENCOUNTER — Other Ambulatory Visit (HOSPITAL_COMMUNITY): Payer: Self-pay

## 2021-06-20 ENCOUNTER — Encounter: Payer: Self-pay | Admitting: Nurse Practitioner

## 2021-06-20 DIAGNOSIS — M25511 Pain in right shoulder: Secondary | ICD-10-CM | POA: Diagnosis not present

## 2021-06-20 DIAGNOSIS — M25512 Pain in left shoulder: Secondary | ICD-10-CM | POA: Diagnosis not present

## 2021-06-20 DIAGNOSIS — M79662 Pain in left lower leg: Secondary | ICD-10-CM | POA: Diagnosis not present

## 2021-06-20 DIAGNOSIS — G894 Chronic pain syndrome: Secondary | ICD-10-CM | POA: Diagnosis not present

## 2021-06-20 DIAGNOSIS — M545 Low back pain, unspecified: Secondary | ICD-10-CM | POA: Diagnosis not present

## 2021-06-20 DIAGNOSIS — M79661 Pain in right lower leg: Secondary | ICD-10-CM | POA: Diagnosis not present

## 2021-06-20 DIAGNOSIS — M542 Cervicalgia: Secondary | ICD-10-CM | POA: Diagnosis not present

## 2021-06-20 DIAGNOSIS — Z79891 Long term (current) use of opiate analgesic: Secondary | ICD-10-CM | POA: Diagnosis not present

## 2021-06-20 DIAGNOSIS — M5136 Other intervertebral disc degeneration, lumbar region: Secondary | ICD-10-CM | POA: Diagnosis not present

## 2021-06-20 DIAGNOSIS — E1143 Type 2 diabetes mellitus with diabetic autonomic (poly)neuropathy: Secondary | ICD-10-CM | POA: Diagnosis not present

## 2021-06-20 DIAGNOSIS — G890 Central pain syndrome: Secondary | ICD-10-CM | POA: Diagnosis not present

## 2021-06-20 NOTE — Telephone Encounter (Signed)
I have called the pt and he stated that he has been having issues with the cpap:  He stated that he has to wear the mask very tight and this causes his head to hurt.    He said that his bottom lip is so chapped it peels.  His mouth is very dry and his tongue sticks to the top of his mouth. He is having severe nasal congestion and has tried OTC meds to help with this but nothing makes it better.  He is not able to sleep.  CY please advise. Thanks t

## 2021-06-20 NOTE — Telephone Encounter (Signed)
Patient is returning phone call. Patient phone number is [phone].

## 2021-06-20 NOTE — Telephone Encounter (Signed)
Lm for patient.  

## 2021-06-20 NOTE — Telephone Encounter (Signed)
Please refer him to sleep center for mask fitting/ desensitization  He can try 1 puff of Afrin in each nostril at bedtime for stuffy nose

## 2021-06-20 NOTE — Telephone Encounter (Signed)
I have called the pt and he is aware of CY recs.  Order placed for him to be set up with sleep center for mask fit.

## 2021-06-21 DIAGNOSIS — L309 Dermatitis, unspecified: Secondary | ICD-10-CM | POA: Diagnosis not present

## 2021-06-21 DIAGNOSIS — Z0000 Encounter for general adult medical examination without abnormal findings: Secondary | ICD-10-CM | POA: Diagnosis not present

## 2021-06-21 DIAGNOSIS — Z9181 History of falling: Secondary | ICD-10-CM | POA: Diagnosis not present

## 2021-06-23 NOTE — Assessment & Plan Note (Signed)
Arrival O2 sat 95% on room air

## 2021-06-23 NOTE — Assessment & Plan Note (Addendum)
Benefits from VPAP with good download. Plan- work on humidifier, use saline nasal gel. Continue VPAP 14/11 PS2

## 2021-06-24 ENCOUNTER — Telehealth (HOSPITAL_COMMUNITY): Payer: Self-pay

## 2021-06-24 DIAGNOSIS — L309 Dermatitis, unspecified: Secondary | ICD-10-CM | POA: Diagnosis not present

## 2021-06-24 DIAGNOSIS — Z136 Encounter for screening for cardiovascular disorders: Secondary | ICD-10-CM | POA: Diagnosis not present

## 2021-06-24 NOTE — Telephone Encounter (Signed)
Will check insurance benefits closer to scheduling and/or into the new year 2023. 

## 2021-06-24 NOTE — Telephone Encounter (Signed)
Attempted to call patient in regards to Cardiac Rehab - LM on VM 

## 2021-06-25 ENCOUNTER — Other Ambulatory Visit (HOSPITAL_COMMUNITY): Payer: Self-pay

## 2021-06-26 ENCOUNTER — Encounter: Payer: Self-pay | Admitting: Cardiology

## 2021-06-26 ENCOUNTER — Other Ambulatory Visit (HOSPITAL_COMMUNITY): Payer: Self-pay

## 2021-06-26 ENCOUNTER — Ambulatory Visit (INDEPENDENT_AMBULATORY_CARE_PROVIDER_SITE_OTHER): Payer: Medicare Other | Admitting: Cardiology

## 2021-06-26 ENCOUNTER — Other Ambulatory Visit: Payer: Self-pay

## 2021-06-26 VITALS — BP 142/80 | HR 76 | Ht 69.0 in | Wt 219.2 lb

## 2021-06-26 DIAGNOSIS — N1831 Chronic kidney disease, stage 3a: Secondary | ICD-10-CM

## 2021-06-26 DIAGNOSIS — E7800 Pure hypercholesterolemia, unspecified: Secondary | ICD-10-CM | POA: Diagnosis not present

## 2021-06-26 DIAGNOSIS — I2510 Atherosclerotic heart disease of native coronary artery without angina pectoris: Secondary | ICD-10-CM

## 2021-06-26 DIAGNOSIS — I2583 Coronary atherosclerosis due to lipid rich plaque: Secondary | ICD-10-CM | POA: Diagnosis not present

## 2021-06-26 DIAGNOSIS — Z794 Long term (current) use of insulin: Secondary | ICD-10-CM | POA: Diagnosis not present

## 2021-06-26 DIAGNOSIS — I428 Other cardiomyopathies: Secondary | ICD-10-CM | POA: Diagnosis not present

## 2021-06-26 DIAGNOSIS — I480 Paroxysmal atrial fibrillation: Secondary | ICD-10-CM | POA: Diagnosis not present

## 2021-06-26 DIAGNOSIS — E118 Type 2 diabetes mellitus with unspecified complications: Secondary | ICD-10-CM

## 2021-06-26 DIAGNOSIS — I10 Essential (primary) hypertension: Secondary | ICD-10-CM | POA: Diagnosis not present

## 2021-06-26 DIAGNOSIS — G4733 Obstructive sleep apnea (adult) (pediatric): Secondary | ICD-10-CM

## 2021-06-26 LAB — BASIC METABOLIC PANEL
BUN/Creatinine Ratio: 9 — ABNORMAL LOW (ref 10–24)
BUN: 15 mg/dL (ref 8–27)
CO2: 26 mmol/L (ref 20–29)
Calcium: 9.2 mg/dL (ref 8.6–10.2)
Chloride: 101 mmol/L (ref 96–106)
Creatinine, Ser: 1.63 mg/dL — ABNORMAL HIGH (ref 0.76–1.27)
Glucose: 171 mg/dL — ABNORMAL HIGH (ref 70–99)
Potassium: 4 mmol/L (ref 3.5–5.2)
Sodium: 143 mmol/L (ref 134–144)
eGFR: 46 mL/min/{1.73_m2} — ABNORMAL LOW (ref >59)

## 2021-06-26 MED ORDER — ACITRETIN 25 MG PO CAPS
ORAL_CAPSULE | ORAL | 0 refills | Status: DC
  Filled 2021-06-26: qty 62, 31d supply, fill #0

## 2021-06-26 NOTE — Addendum Note (Signed)
Addended by: Antonieta Iba on: 06/26/2021 12:17 PM   Modules accepted: Orders

## 2021-06-26 NOTE — Progress Notes (Signed)
Cardiology Office Note   Date:  06/26/2021   ID:  Marco, [name] Dec 20, 1955, MRN [id number]  PCP:  [name], MD  Cardiologist:  [name]    Chief Complaint  Patient presents with   Coronary Artery Disease   Atrial Fibrillation   Congestive Heart Failure   Cardiomyopathy   Sleep Apnea   Hypertension   Hyperlipidemia       History of Present Illness: Marco [name] is a 65 y.o. male who presents for review of stress test and discuss cardiac cath.     hx of combined systolic and diastolic heart failure, nonischemic cardiomyopathy, mild nonobstructive coronary artery disease by cardiac catheterization in 2017, persistent atrial fibrillation with prior history of ablation in [place] in 2017, diabetes, hypertension, chronic kidney disease, sleep apnea, obesity and OSA on CPAP.    His EF was 45-50 but improved to normal by Echo in 01/2017. CHADS2-VASc=4 (CHF, DM, CAD, HTN).  Anticoagulation for PAF has been discontinued in the past secondary to anemia.  He has a history of chronic dyspnea and chronic chest discomfort.  Evaluation by pulmonology included a cardiopulmonary stress test in 2017.  However, this was not conclusive due to submaximal effort.    He was seen in August 2019 for worsening shortness of breath.  An echocardiogram in September 2019 demonstrated worsening LV function with an EF of 35-40%.     Coronary CTA was planned however, the patient was admitted in October 2019 with chest pain and mildly elevated troponin.  Echocardiogram demonstrated EF 25-30%.  Cardiac catheterization demonstrated mild plaque without obstructive disease.  CHF medications were adjusted for nonischemic cardiomyopathy.     He was evaluated by [name] in the advanced heart failure clinic in October 2019.  PYP scan was strongly suggestive of transthyretin amyloidosis (grade 2, H/CLL equal 1.94) .  After discussion with [name] with Hematology he felt he did not have  myeloma.  Genetic testing negative and myeloma panel with MUGUS but no myeloma.  Now on Tafamadis and followed by AHF clinic. No Entresto, Hydralazine and imdur due to soft BP.   He saw me on 04/29/21 and complained of chest pain.  His Imdur was increased to 60 mg daily and nuclear stress test was done which showed evidence of infarct but pt without hx of MI,  EF 36% with multiple focal wall motion abnormalities.  With ongoing chest pain and EKG changes and abnormal nuc he underwent cardiac catheterization 06/11/2021 showing 10% ostial left circumflex, 75% mid LAD, 75% ostial D2 status post PCI of the mid LAD lesion and balloon angioplasty of the diagonal lesion.  LVEDP was normal.  He was started on DAPT with Plavix 75 mg daily and aspirin 81 mg daily.    He is here today for followup and is doing well.  He denies any chest pain or pressure,  PND, orthopnea, LE edema, dizziness, palpitations or syncope. He has chronic DOE which is stable.  He is compliant with his meds and is tolerating meds with no SE.     Past Medical History:  Diagnosis Date   Benign colon polyp 08/01/2013   [facility] in [place]. large base tranverse colon polyp was biopsied. polyp was benign with minimal surface hyperplastic change.   Chronic combined systolic and diastolic CHF (congestive heart failure) (HCC)    Chronic pain    CKD (chronic kidney disease), stage III (HCC)    Diabetes mellitus without complication ([place])  Diverticulosis    Esophageal hiatal hernia 07/29/2013   confirmed on EGD    Esophageal stricture    Essential hypertension    Gastritis 07/29/2013   confirmed on EGD, bx done an negative for intestinal metaplasia, dsyplasia or H. pylori. normal gastric emptying study done 07/13/2013.   GERD (gastroesophageal reflux disease)    Morbid obesity (HCC)    Non-obstructive CAD    Coronary CTA 04/2021 showed coronary calcium score of 535 which is 93rd % for age and sex matched controls.  There is  mild CAD with mild calcified plaque in the distal LAD 25-49% and also in the ostial D3.  There is minimal calcified plaque in the mid LCx and mild mixed plaque in the pRCA with 25-49% stenosis.   Persistent atrial fibrillation ([place])    a. 02/2013 s/p rfca in [place], [place]-->prev on Xarelto, d/c'd 2/2 anemia, ? GIB.   Rheumatoid arthritis ([place])    Sigmoid diverticulosis 08/01/2013   confirmed on colonscopy. record scanned into chart    Past Surgical History:  Procedure Laterality Date   CARDIAC CATHETERIZATION  05/2014   ablation for atrial fibrillation   CARDIAC CATHETERIZATION N/A 11/16/2015   Procedure: Left Heart Cath and Coronary Angiography;  Surgeon: Leonie Man, MD;  Location: [place] [facility];  Service: Cardiovascular;  Laterality: N/A;   COLON RESECTION  09/2013   due to large, abnormal polpy. non cancerous per patient.    COLON SURGERY  09/2014   colon resection    CORONARY STENT INTERVENTION N/A 06/11/2021   Procedure: CORONARY STENT INTERVENTION;  Surgeon: Jettie Booze, MD;  Location: [place] [facility];  Service: Cardiovascular;  Laterality: N/A;   EXTRACORPOREAL SHOCK WAVE LITHOTRIPSY Left 01/03/2021   Procedure: EXTRACORPOREAL SHOCK WAVE LITHOTRIPSY (ESWL);  Surgeon: Robley Fries, MD;  Location: [facility];  Service: Urology;  Laterality: Left;   IR PERC PLEURAL DRAIN W/INDWELL CATH W/IMG GUIDE  01/07/2020   LEFT HEART CATH AND CORONARY ANGIOGRAPHY N/A 05/19/2018   Procedure: LEFT HEART CATH AND CORONARY ANGIOGRAPHY;  Surgeon: Wellington Hampshire, MD;  Location: [place] [facility];  Service: Cardiovascular;  Laterality: N/A;   LEFT HEART CATH AND CORONARY ANGIOGRAPHY N/A 06/11/2021   Procedure: LEFT HEART CATH AND CORONARY ANGIOGRAPHY;  Surgeon: Jettie Booze, MD;  Location: [place] [facility];  Service: Cardiovascular;  Laterality: N/A;     Current Outpatient Medications  Medication Sig Dispense Refill   Accu-Chek Softclix Lancets  lancets USE AS INSTRUCTED 100 each 12   acetaminophen (TYLENOL) 650 MG CR tablet Take 1,300 mg by mouth in the morning and at bedtime.     acitretin (SORIATANE) 25 MG capsule Take 2 (two) capsules by mouth once daily. 62 capsule 0   albuterol (VENTOLIN HFA) 108 (90 Base) MCG/ACT inhaler INHALE 2 PUFFS INTO THE LUNGS EVERY 6 HOURS AS NEEDED FOR WHEEZING OR SHORTNESS OF BREATH. 8.5 g 1   allopurinol (ZYLOPRIM) 100 MG tablet Take 2 tablets by mouth daily (Patient taking differently: Take 200 mg by mouth every evening.) 60 tablet 0   amLODipine (NORVASC) 5 MG tablet Take 1 tablet (5 mg total) by mouth daily. (Patient taking differently: Take 5 mg by mouth every evening.) 90 tablet 1   aspirin (EQ ASPIRIN ADULT LOW DOSE) 81 MG EC tablet Take 1 tablet (81 mg total) by mouth daily. TAKE 1 TABLET BY MOUTH ONCE DAILY. SWALLOW WHOLE 30 tablet 12   atorvastatin (LIPITOR) 40 MG tablet Take 1  tablet (40 mg total) by mouth daily. 90 tablet 3   carvedilol (COREG) 12.5 MG tablet TAKE 1 TABLET BY MOUTH TWO TIMES DAILY WITH MEALS 180 tablet 1   clopidogrel (PLAVIX) 75 MG tablet Take 1 tablet (75 mg total) by mouth daily. 30 tablet 5   Continuous Blood Gluc Receiver (DEXCOM G6 RECEIVER) DEVI Use as instructed to monitor blood sugar daily 1 each 0   Continuous Blood Gluc Sensor (DEXCOM G6 SENSOR) MISC Use as instructed to monitor blood sugar daily. Change every 10 days. 9 each 3   Continuous Blood Gluc Transmit (DEXCOM G6 TRANSMITTER) MISC Use as instructed to monitor blood sugar daily. Change every 90 days. 1 each 3   diclofenac Sodium (VOLTAREN) 1 % GEL Apply 4 g topically 4 (four) times daily. (Patient taking differently: Apply 4 g topically 4 (four) times daily as needed (pain).) 100 g 0   DULoxetine (CYMBALTA) 20 MG capsule TAKE 1 CAPSULE BY MOUTH ONCE A DAY 90 capsule 0   famotidine (PEPCID) 20 MG tablet TAKE 1 TABLET BY MOUTH 2 TIMES DAILY 60 tablet 2   fexofenadine (ALLEGRA) 180 MG tablet Take 180 mg by mouth  every evening.     fluticasone (FLONASE) 50 MCG/ACT nasal spray PLACE 1 SPRAY INTO BOTH NOSTRILS DAILY AS NEEDED FOR ALLERGIES OR RHINITIS. (Patient taking differently: Place 1 spray into both nostrils at bedtime.) 16 g 1   furosemide (LASIX) 40 MG tablet Take 1 tablet (40 mg total) by mouth every evening. 90 tablet 3   furosemide (LASIX) 80 MG tablet Take 1 tablet (80 mg total) by mouth every morning. 90 tablet 3   gabapentin (NEURONTIN) 300 MG capsule Take 1 capsule (300 mg total) by mouth at bedtime. 30 capsule 3   glucose blood test strip USE TO TEST 4 TIMES DAILY. (Patient taking differently: USE TO TEST 4 TIMES DAILY.) 400 strip 3   Insulin Lispro Prot & Lispro (HUMALOG MIX 50/50 KWIKPEN) (50-50) 100 UNIT/ML Kwikpen Inject 95 Units into the skin daily with breakfast. 105 mL 3   Insulin Pen Needle (UNIFINE PENTIPS) 32G X 4 MM MISC USE AS DIRECTED DAILY 100 each 0   isosorbide mononitrate (IMDUR) 60 MG 24 hr tablet Take 1.5 tablets (90 mg total) by mouth daily. 135 tablet 3   melatonin 5 MG TABS Take 5 mg by mouth at bedtime.     naloxone (NARCAN) nasal spray 4 mg/0.1 mL Place in nostril in the event of suspected overdose on Narcotic medication (Oxycodone) 1 each 0   nitroGLYCERIN (NITROSTAT) 0.4 MG SL tablet Place 1 tablet (0.4 mg total) under the tongue every 5 (five) minutes as needed for chest pain. 25 tablet 2   oxyCODONE-acetaminophen (PERCOCET/ROXICET) 5-325 MG tablet Take 1 tablet by mouth 4 (four) times daily.     oxymetazoline (12 HOUR DECONGESTANT) 0.05 % nasal spray Place 1 spray into both nostrils 2 (two) times daily. 30 mL 0   polyethylene glycol (MIRALAX / GLYCOLAX) 17 g packet Take 17 g by mouth daily as needed for moderate constipation or severe constipation. 14 each 0   potassium chloride SA (KLOR-CON) 20 MEQ tablet Take 1 and 1/2 tablets (30 mEq total) by mouth daily. (Patient taking differently: Take 10-20 mEq by mouth See admin instructions. Take 1 tablet (20 meq) by mouth in  the morning & take 0.5 tablet (10 meq) by mouth in the evening.) 90 tablet 3   Secukinumab (COSENTYX SENSOREADY PEN) 150 MG/ML SOAJ INJECT ONE PEN SUBCUTANEOUSLY EVERY  4 WEEKS. REFRIGERATE. ALLOW 15 TO 30 MINUTES AT ROOM TEMP PRIOR TO ADMINISTRATION. 3 mL 1   Tafamidis 61 MG CAPS TAKE 1 CAPSULE (61 MG) BY MOUTH DAILY. 30 capsule 11   tirzepatide (MOUNJARO) 2.5 MG/0.5ML Pen Inject 1 pen (2.5 mg) into the skin once a week. 2 mL 3   triamcinolone cream (KENALOG) 0.1 % APPLY TO THE AFFECTED AREA(S) TOPICALLY 2 TIMES DAILY AS DIRECTED (Patient taking differently: Apply 1 application topically daily.) 454 g 0   UNIFINE PENTIPS 32G X 4 MM MISC USE AS DIRECTED DAILY 100 each 0   metoprolol tartrate (LOPRESSOR) 100 MG tablet Take 1 tablet (100 mg total) by mouth once for 1 dose. 1 tablet 0   No current facility-administered medications for this visit.    Allergies:   Nsaids and Jardiance [empagliflozin]    Social History:  The patient  reports that he quit smoking about 13 years ago. His smoking use included cigarettes. He has a 5.00 pack-year smoking history. He has never used smokeless tobacco. He reports that he does not drink alcohol and does not use drugs.   Family History:  The patient's family history includes COPD in his father and mother; Diabetes in his father and mother; Heart Problems in his father; Hypertension in his father and mother; Lung cancer in his mother.    ROS:  General:no colds or fevers, no weight changes Skin:no rashes or ulcers HEENT:no blurred vision, no congestion CV:see HPI PUL:see HPI GI:no diarrhea constipation or melena, no indigestion GU:no hematuria, no dysuria MS:no joint pain, no claudication Neuro:no syncope, no lightheadedness Endo:+ diabetes, no thyroid disease  Wt Readings from Last 3 Encounters:  06/26/21 219 lb 3.2 oz (99.4 kg)  06/11/21 220 lb (99.8 kg)  05/31/21 223 lb 6.4 oz (101.3 kg)     PHYSICAL EXAM: VS:  BP (!) 142/80   Pulse 76   Ht 5'  9" (1.753 m)   Wt 219 lb 3.2 oz (99.4 kg)   SpO2 98%   BMI 32.37 kg/m  , BMI Body mass index is 32.37 kg/m.  GEN: Well nourished, well developed in no acute distress HEENT: Normal NECK: No JVD; No carotid bruits LYMPHATICS: No lymphadenopathy CARDIAC:RRR, no murmurs, rubs, gallops RESPIRATORY:  Clear to auscultation without rales, wheezing or rhonchi  ABDOMEN: Soft, non-tender, non-distended MUSCULOSKELETAL:  No edema; No deformity  SKIN: Warm and dry NEUROLOGIC:  Alert and oriented x 3 PSYCHIATRIC:  Normal affect   EKG:  EKG is not ordered today.  Recent Labs: 08/21/2020: B Natriuretic Peptide 106.4 01/11/2021: ALT 8 04/11/2021: TSH 1.720 05/31/2021: Hemoglobin 12.4; Platelets 257 06/11/2021: BUN 13; Creatinine, Ser 1.65; Potassium 3.8; Sodium 138    Lipid Panel    Component Value Date/Time   CHOL 97 (L) 01/11/2021 1104   TRIG 172 (H) 01/11/2021 1104   HDL 27 (L) 01/11/2021 1104   CHOLHDL 3.6 01/11/2021 1104   CHOLHDL 2.6 06/10/2016 1253   VLDL 18 06/10/2016 1253   LDLCALC 41 01/11/2021 1104       Other studies Reviewed: Additional studies/ records that were reviewed today include:  Stress test.  05/14/21 .   Findings are consistent with prior myocardial infarction with peri-infarct ischemia. The study is intermediate risk.   No ST deviation was noted.   LV perfusion is abnormal. There is no evidence of ischemia. There is evidence of infarction. Defect 1: There is a medium defect with moderate reduction in uptake present in the apical to mid inferior and apex  location(s) that is fixed. There is abnormal wall motion in the defect area. Consistent with infarction and peri-infarct ischemia.   Left ventricular function is abnormal. Global function is moderately reduced. Nuclear stress EF: 36 %. The left ventricular ejection fraction is moderately decreased (30-44%). End diastolic cavity size is mildly enlarged. End systolic cavity size is mildly enlarged.   Prior study available  for comparison from 01/01/2017. No changes compared to prior study. Nuclear stress EF: 44%. There was no ST segment deviation noted during stress. Defect 1: There is a small defect of severe severity present in the apical septal and apex location. This is a low risk study. The left ventricular ejection fraction is moderately decreased (30-44%).   Low risk stress nuclear study with small prior distal septal/apical infarct; no ischemia; EF 44 with global hypokinesis and mild LVE.   Moderate size and intensity partially reversible inferoapical, apical and apical lateral perfusion defect suggestive of scar with minimal peri-infarct ischemia (SDS 3). LVEF 36% with distal apical, apical lateral and apical hypokinesis. This is an intermediate risk study. Compared to a prior study in 2018, the LVEF is lower (was 44%), however, the perfusion defect is similar.  Echo 08/21/20  IMPRESSIONS     1. Left ventricular ejection fraction, by estimation, is 50 to 55%. The  left ventricle has low normal function. The left ventricle has no regional  wall motion abnormalities. The left ventricular internal cavity size was  moderately to severely dilated.  There is moderate left ventricular hypertrophy. Left ventricular diastolic  parameters are indeterminate.   2. Right ventricular systolic function is normal. The right ventricular  size is normal. There is normal pulmonary artery systolic pressure.   3. The mitral valve is normal in structure. Trivial mitral valve  regurgitation. No evidence of mitral stenosis.   4. The aortic valve is tricuspid. Aortic valve regurgitation is not  visualized. No aortic stenosis is present.   5. The inferior vena cava is normal in size with greater than 50%  respiratory variability, suggesting right atrial pressure of 3 mmHg.   Comparison(s): Changes from prior study are noted.   Conclusion(s)/Recommendation(s): EF improved from prior study.   FINDINGS   Left Ventricle:  Left ventricular ejection fraction, by estimation, is 50  to 55%. The left ventricle has low normal function. The left ventricle has  no regional wall motion abnormalities. The left ventricular internal  cavity size was moderately to  severely dilated. There is moderate left ventricular hypertrophy. Left  ventricular diastolic parameters are indeterminate.   Right Ventricle: The right ventricular size is normal. No increase in  right ventricular wall thickness. Right ventricular systolic function is  normal. There is normal pulmonary artery systolic pressure. The tricuspid  regurgitant velocity is 2.39 m/s, and   with an assumed right atrial pressure of 3 mmHg, the estimated right  ventricular systolic pressure is 46.9 mmHg.   Left Atrium: Left atrial size was normal in size.   Right Atrium: Right atrial size was normal in size.   Pericardium: Trivial pericardial effusion is present.   Mitral Valve: The mitral valve is normal in structure. Trivial mitral  valve regurgitation. No evidence of mitral valve stenosis.   Tricuspid Valve: The tricuspid valve is normal in structure. Tricuspid  valve regurgitation is trivial. No evidence of tricuspid stenosis.   Aortic Valve: The aortic valve is tricuspid. Aortic valve regurgitation is  not visualized. No aortic stenosis is present.   Pulmonic Valve: The pulmonic valve was not  well visualized. Pulmonic valve  regurgitation is not visualized.   Aorta: The aortic root, ascending aorta and aortic arch are all  structurally normal, with no evidence of dilitation or obstruction.   Venous: The inferior vena cava is normal in size with greater than 50%  respiratory variability, suggesting right atrial pressure of 3 mmHg.   IAS/Shunts: The atrial septum is grossly normal.      LEFT VENTRICLE  PLAX 2D  LVIDd:         5.80 cm      Diastology  LVIDs:         4.00 cm      LV e' medial:    3.48 cm/s  LV PW:         1.30 cm      LV E/e' medial:   16.9  LV IVS:        1.40 cm      LV e' lateral:   5.66 cm/s  LVOT diam:     2.40 cm      LV E/e' lateral: 10.4  LV SV:         69  LV SV Index:   33  LVOT Area:     4.52 cm   ASSESSMENT AND PLAN:  1.  ASCAD -cath 06/11/2021 showing 10% ostial left circumflex, 75% mid LAD, 75% ostial D2 status post PCI of the mid LAD lesion and balloon angioplasty of the diagonal lesion.  LVEDP was normal.   -He was started on DAPT with Plavix 75 mg daily and aspirin 81 mg daily.  2 -He has not had any more anginal symptoms -Continue prescription drug management with aspirin 81 mg daily, Plavix 75 mg daily, carvedilol 12.5 mg twice daily, amlodipine 5 mg daily, Imdur 90 mg daily with as needed refills.    3.  NICM/Chronic combined systolic/diastolic CHF --Echo 16/9/67 LVEF 25-30%, Grade 1 DD, Trivial MR.  - Echo 2/20 40-45%  RV ok. No need for ICD currently.  - Echo 3/21 EF 55-60% -> complete recovery - Echo 05/16/20 EF 25-30% (drop in EF after recent illness) - Echo 08/22/19 w/ improved EF 45-50%.  RV normal.   - Very mild non-obstructive CAD on cath 05/19/18,  NICM  - PYP scan 11/19 read as + for Cardiac amyloidosis. But reviewed by [name], and not 893% certain it is positive. Genetic testing negative. Myeloma Panel with MGUS, no myeloma. Now on Tafamadis.  - Repeat PYP 1/21. Reviewed personally. Read as equivocal. Felt negative by [name]. - Ideally, would get cMRI, but limited due to claustrophobia.  -Diagnosed in past with improvement of  EF to 50-55% in 08/2020 and now drop in EF on stress test to 38% 05/2021. -did not tolerate hydralazine or Entresto due to and dizziness and AKI -Now status post revascularization of the LAD and diagonal. -Repeat 2D echocardiogram in 2 months -followed by AHF  4.  CKD 3  -last Cr was 1.39 prior to cath -Serum creatinine bumped to 1.65 the day of cath -Repeat bmet today  5.  HLD  -LDL goal is less than 70  -I have personally reviewed and  interpreted outside labs performed by patient's PCP which showed LDL 41, HDL 27, triglycerides 172, ALT 8 in June 2022 -Continue prescription drug management with Lipitor 40 mg daily with as needed refills   6.  DM-2  -PCP following on insulin  7.  OSA - The patient is tolerating PAP therapy well without any problems. The  PAP download performed by his DME was personally reviewed and interpreted by me today and showed an AHI of 1.4/hr on 14/11 cm H2O with 93% compliance in using more than 4 hours nightly.  The patient has been using and benefiting from PAP use and will continue to -followed by Dr. Annamaria Boots   8.  HTN  -BP is well controlled on exam today  -Continue prescription drug management with Imdur 90 mg daily, carvedilol 12.5 mg twice daily with as needed refills   9.  PAF   -hx of RFCA in 2017  -He continues to maintain normal sinus rhythm and denies palpitations  -His Xarelto was stopped due to anemia  Current medicines are reviewed with the patient today.  The patient Has no concerns regarding medicines.  Signed, [name], MD  06/26/2021 12:09 PM    [address], [place], [place] Phone: [phone]; Fax: [phone]

## 2021-06-26 NOTE — Patient Instructions (Addendum)
Medication Instructions:  Your physician recommends that you continue on your current medications as directed. Please refer to the Current Medication list given to you today.  *If you need a refill on your cardiac medications before your next appointment, please call your pharmacy*  Lab Work: TODAY: BMET If you have labs (blood work) drawn today and your tests are completely normal, you will receive your results only by: [place] (if you have MyChart) OR A paper copy in the mail If you have any lab test that is abnormal or we need to change your treatment, we will call you to review the results.   Testing/Procedures: Your physician has requested that you have an echocardiogram in January 2023. Echocardiography is a painless test that uses sound waves to create images of your heart. It provides your doctor with information about the size and shape of your heart and how well your heart's chambers and valves are working. This procedure takes approximately one hour. There are no restrictions for this procedure.   Follow-Up: At [facility], you and your health needs are our priority.  As part of our continuing mission to provide you with exceptional heart care, we have created designated Provider Care Teams.  These Care Teams include your primary Cardiologist (physician) and Advanced Practice Providers (APPs -  Physician Assistants and Nurse Practitioners) who all work together to provide you with the care you need, when you need it.  Your next appointment:   10/30/21 at 2:20pm  The format for your next appointment:   In Person  Provider:   [name], MD

## 2021-06-27 ENCOUNTER — Other Ambulatory Visit (HOSPITAL_COMMUNITY): Payer: Self-pay

## 2021-06-28 ENCOUNTER — Other Ambulatory Visit: Payer: Self-pay | Admitting: Internal Medicine

## 2021-06-28 ENCOUNTER — Other Ambulatory Visit (HOSPITAL_COMMUNITY): Payer: Self-pay

## 2021-06-28 ENCOUNTER — Other Ambulatory Visit (HOSPITAL_COMMUNITY): Payer: Self-pay | Admitting: Internal Medicine

## 2021-06-28 MED ORDER — CARVEDILOL 12.5 MG PO TABS
ORAL_TABLET | Freq: Two times a day (BID) | ORAL | 1 refills | Status: AC
  Filled 2021-06-28: qty 180, 90d supply, fill #0
  Filled 2021-09-23: qty 180, 90d supply, fill #1

## 2021-06-28 MED ORDER — ALBUTEROL SULFATE HFA 108 (90 BASE) MCG/ACT IN AERS
2.0000 | INHALATION_SPRAY | Freq: Four times a day (QID) | RESPIRATORY_TRACT | 1 refills | Status: AC | PRN
  Filled 2021-06-28: qty 8.5, 25d supply, fill #0

## 2021-06-28 NOTE — Telephone Encounter (Signed)
Will forward to provider  

## 2021-06-29 ENCOUNTER — Other Ambulatory Visit (HOSPITAL_COMMUNITY): Payer: Self-pay

## 2021-06-29 MED ORDER — DULOXETINE HCL 20 MG PO CPEP
ORAL_CAPSULE | Freq: Every day | ORAL | 1 refills | Status: AC
  Filled 2021-06-29: qty 90, 90d supply, fill #0
  Filled 2021-09-23: qty 90, 90d supply, fill #1

## 2021-07-01 ENCOUNTER — Other Ambulatory Visit (HOSPITAL_COMMUNITY): Payer: Self-pay

## 2021-07-01 MED FILL — Lancets: 25 days supply | Qty: 100 | Fill #1 | Status: AC

## 2021-07-01 MED FILL — Glucose Blood Test Strip: 88 days supply | Qty: 350 | Fill #1 | Status: CN

## 2021-07-02 ENCOUNTER — Other Ambulatory Visit (HOSPITAL_COMMUNITY): Payer: Self-pay

## 2021-07-08 ENCOUNTER — Ambulatory Visit (HOSPITAL_BASED_OUTPATIENT_CLINIC_OR_DEPARTMENT_OTHER): Payer: Medicare Other | Attending: Internal Medicine | Admitting: Internal Medicine

## 2021-07-08 ENCOUNTER — Other Ambulatory Visit (HOSPITAL_COMMUNITY): Payer: Self-pay

## 2021-07-08 ENCOUNTER — Other Ambulatory Visit: Payer: Self-pay

## 2021-07-08 DIAGNOSIS — G4733 Obstructive sleep apnea (adult) (pediatric): Secondary | ICD-10-CM

## 2021-07-08 MED ORDER — ALLOPURINOL 100 MG PO TABS
200.0000 mg | ORAL_TABLET | Freq: Every day | ORAL | 0 refills | Status: DC
  Filled 2021-07-08: qty 60, 30d supply, fill #0

## 2021-07-08 MED FILL — Tafamidis Cap 61 MG: ORAL | 30 days supply | Qty: 30 | Fill #7 | Status: AC

## 2021-07-09 ENCOUNTER — Telehealth: Payer: Self-pay

## 2021-07-09 DIAGNOSIS — I2510 Atherosclerotic heart disease of native coronary artery without angina pectoris: Secondary | ICD-10-CM

## 2021-07-09 NOTE — Telephone Encounter (Signed)
Form received for patient's qualification for cardiac rehab phase 2. Per [name] the patient will need an exercise tolerance test prior. Patient is aware and orders have been placed.

## 2021-07-10 DIAGNOSIS — R2681 Unsteadiness on feet: Secondary | ICD-10-CM | POA: Diagnosis not present

## 2021-07-10 DIAGNOSIS — R269 Unspecified abnormalities of gait and mobility: Secondary | ICD-10-CM | POA: Diagnosis not present

## 2021-07-10 DIAGNOSIS — G4733 Obstructive sleep apnea (adult) (pediatric): Secondary | ICD-10-CM | POA: Diagnosis not present

## 2021-07-10 DIAGNOSIS — G4700 Insomnia, unspecified: Secondary | ICD-10-CM | POA: Diagnosis not present

## 2021-07-11 ENCOUNTER — Other Ambulatory Visit: Payer: Self-pay

## 2021-07-11 ENCOUNTER — Ambulatory Visit (INDEPENDENT_AMBULATORY_CARE_PROVIDER_SITE_OTHER): Payer: Medicare Other

## 2021-07-11 DIAGNOSIS — M79662 Pain in left lower leg: Secondary | ICD-10-CM | POA: Diagnosis not present

## 2021-07-11 DIAGNOSIS — M25512 Pain in left shoulder: Secondary | ICD-10-CM | POA: Diagnosis not present

## 2021-07-11 DIAGNOSIS — G890 Central pain syndrome: Secondary | ICD-10-CM | POA: Diagnosis not present

## 2021-07-11 DIAGNOSIS — M79661 Pain in right lower leg: Secondary | ICD-10-CM | POA: Diagnosis not present

## 2021-07-11 DIAGNOSIS — M5412 Radiculopathy, cervical region: Secondary | ICD-10-CM | POA: Diagnosis not present

## 2021-07-11 DIAGNOSIS — I2583 Coronary atherosclerosis due to lipid rich plaque: Secondary | ICD-10-CM

## 2021-07-11 DIAGNOSIS — E1143 Type 2 diabetes mellitus with diabetic autonomic (poly)neuropathy: Secondary | ICD-10-CM | POA: Diagnosis not present

## 2021-07-11 DIAGNOSIS — I2510 Atherosclerotic heart disease of native coronary artery without angina pectoris: Secondary | ICD-10-CM | POA: Diagnosis not present

## 2021-07-11 DIAGNOSIS — M25511 Pain in right shoulder: Secondary | ICD-10-CM | POA: Diagnosis not present

## 2021-07-11 DIAGNOSIS — M542 Cervicalgia: Secondary | ICD-10-CM | POA: Diagnosis not present

## 2021-07-11 DIAGNOSIS — Z79891 Long term (current) use of opiate analgesic: Secondary | ICD-10-CM | POA: Diagnosis not present

## 2021-07-11 DIAGNOSIS — M545 Low back pain, unspecified: Secondary | ICD-10-CM | POA: Diagnosis not present

## 2021-07-11 DIAGNOSIS — G894 Chronic pain syndrome: Secondary | ICD-10-CM | POA: Diagnosis not present

## 2021-07-11 DIAGNOSIS — M5136 Other intervertebral disc degeneration, lumbar region: Secondary | ICD-10-CM | POA: Diagnosis not present

## 2021-07-11 LAB — EXERCISE TOLERANCE TEST
Angina Index: 0
Duke Treadmill Score: 1
Estimated workload: 1.8
Exercise duration (sec): 33 s
MPHR: 154 {beats}/min
Peak HR: 90 {beats}/min
Percent HR: 58 %
RPE: 15
Rest HR: 76 {beats}/min
ST Depression (mm): 0 mm

## 2021-07-11 NOTE — Telephone Encounter (Signed)
Shared Decision Making/Informed Consent The risks [chest pain, shortness of breath, cardiac arrhythmias, dizziness, blood pressure fluctuations, myocardial infarction, stroke/transient ischemic attack, and life-threatening complications (estimated to be 1 in 10,000)], benefits (risk stratification, diagnosing coronary artery disease, treatment guidance) and alternatives of an exercise tolerance test were discussed in detail with Marco [name] and he agrees to proceed.

## 2021-07-15 DIAGNOSIS — J9601 Acute respiratory failure with hypoxia: Secondary | ICD-10-CM | POA: Diagnosis not present

## 2021-07-19 ENCOUNTER — Other Ambulatory Visit (HOSPITAL_COMMUNITY): Payer: Self-pay

## 2021-07-19 DIAGNOSIS — L309 Dermatitis, unspecified: Secondary | ICD-10-CM | POA: Diagnosis not present

## 2021-07-19 MED ORDER — ACITRETIN 25 MG PO CAPS
50.0000 mg | ORAL_CAPSULE | Freq: Every day | ORAL | 0 refills | Status: DC
  Filled 2021-07-19: qty 62, 31d supply, fill #0

## 2021-07-19 MED ORDER — EUCRISA 2 % EX OINT
TOPICAL_OINTMENT | CUTANEOUS | 0 refills | Status: AC
  Filled 2021-07-19: qty 60, 30d supply, fill #0

## 2021-07-22 ENCOUNTER — Other Ambulatory Visit (HOSPITAL_COMMUNITY): Payer: Self-pay

## 2021-07-23 ENCOUNTER — Ambulatory Visit (INDEPENDENT_AMBULATORY_CARE_PROVIDER_SITE_OTHER): Payer: Medicare Other | Admitting: Endocrinology

## 2021-07-23 ENCOUNTER — Other Ambulatory Visit (HOSPITAL_COMMUNITY): Payer: Self-pay

## 2021-07-23 ENCOUNTER — Other Ambulatory Visit: Payer: Self-pay

## 2021-07-23 VITALS — BP 120/64 | HR 75 | Ht 69.0 in | Wt 222.6 lb

## 2021-07-23 DIAGNOSIS — N183 Chronic kidney disease, stage 3 unspecified: Secondary | ICD-10-CM

## 2021-07-23 DIAGNOSIS — E1122 Type 2 diabetes mellitus with diabetic chronic kidney disease: Secondary | ICD-10-CM

## 2021-07-23 LAB — POCT GLYCOSYLATED HEMOGLOBIN (HGB A1C): Hemoglobin A1C: 7.8 % — AB (ref 4.0–5.6)

## 2021-07-23 MED ORDER — HUMALOG MIX 50/50 KWIKPEN (50-50) 100 UNIT/ML ~~LOC~~ SUPN
80.0000 [IU] | PEN_INJECTOR | Freq: Every day | SUBCUTANEOUS | 3 refills | Status: DC
  Filled 2021-07-23 – 2021-09-23 (×2): qty 90, 112d supply, fill #0

## 2021-07-23 MED ORDER — TIRZEPATIDE 5 MG/0.5ML ~~LOC~~ SOAJ
5.0000 mg | SUBCUTANEOUS | 3 refills | Status: DC
  Filled 2021-07-23: qty 2, 28d supply, fill #0
  Filled 2021-08-23: qty 2, 28d supply, fill #1
  Filled 2021-09-23: qty 2, 28d supply, fill #2
  Filled ????-??-??: fill #2

## 2021-07-23 NOTE — Progress Notes (Signed)
Subjective:    Patient ID: Marco [name], male    DOB: 1956-03-19, 65 y.o.   MRN: [id number]  HPI Pt returns for f/u of diabetes mellitus: DM type: Insulin-requiring type 2 Dx'ed: 9622 Complications: PN, CAD, NPDR, and stage 3a CRI.  Therapy: insulin since soon after dx, and Mounjaro.   DKA: never Severe hypoglycemia: once (2020) Pancreatitis: never.  Pancreatic imaging: never.  Other: he intermittently takes prednisone, for psoriasis; he declined to continue multiple daily injections; due to renal failure (and fasting hypoglycemia), he takes 50/50 insulin. Interval history: I reviewed continuous glucose monitor data.  glucose varies from 90-250.  It highest 3AM, 1PM., and 6PM  It decreases 3AM-9AM.  pt says he never misses the insulin.  He drinks orange juice at 2 AM (HS).  No recent steroids.  Past Medical History:  Diagnosis Date   Benign colon polyp 08/01/2013   [facility] in [place]. large base tranverse colon polyp was biopsied. polyp was benign with minimal surface hyperplastic change.   Chronic combined systolic and diastolic CHF (congestive heart failure) (HCC)    Chronic pain    CKD (chronic kidney disease), stage III (HCC)    Diabetes mellitus without complication ([place])    Diverticulosis    Esophageal hiatal hernia 07/29/2013   confirmed on EGD    Esophageal stricture    Essential hypertension    Gastritis 07/29/2013   confirmed on EGD, bx done an negative for intestinal metaplasia, dsyplasia or H. pylori. normal gastric emptying study done 07/13/2013.   GERD (gastroesophageal reflux disease)    Morbid obesity (HCC)    Non-obstructive CAD    Coronary CTA 04/2021 showed coronary calcium score of 535 which is 93rd % for age and sex matched controls.  There is mild CAD with mild calcified plaque in the distal LAD 25-49% and also in the ostial D3.  There is minimal calcified plaque in the mid LCx and mild mixed plaque in the pRCA with 25-49% stenosis.    Persistent atrial fibrillation ([place])    a. 02/2013 s/p rfca in [place], [place]-->prev on Xarelto, d/c'd 2/2 anemia, ? GIB.   Rheumatoid arthritis ([place])    Sigmoid diverticulosis 08/01/2013   confirmed on colonscopy. record scanned into chart    Past Surgical History:  Procedure Laterality Date   CARDIAC CATHETERIZATION  05/2014   ablation for atrial fibrillation   CARDIAC CATHETERIZATION N/A 11/16/2015   Procedure: Left Heart Cath and Coronary Angiography;  Surgeon: Leonie Man, MD;  Location: [place] [facility];  Service: Cardiovascular;  Laterality: N/A;   COLON RESECTION  09/2013   due to large, abnormal polpy. non cancerous per patient.    COLON SURGERY  09/2014   colon resection    CORONARY STENT INTERVENTION N/A 06/11/2021   Procedure: CORONARY STENT INTERVENTION;  Surgeon: Jettie Booze, MD;  Location: [place] [facility];  Service: Cardiovascular;  Laterality: N/A;   EXTRACORPOREAL SHOCK WAVE LITHOTRIPSY Left 01/03/2021   Procedure: EXTRACORPOREAL SHOCK WAVE LITHOTRIPSY (ESWL);  Surgeon: Robley Fries, MD;  Location: [facility];  Service: Urology;  Laterality: Left;   IR PERC PLEURAL DRAIN W/INDWELL CATH W/IMG GUIDE  01/07/2020   LEFT HEART CATH AND CORONARY ANGIOGRAPHY N/A 05/19/2018   Procedure: LEFT HEART CATH AND CORONARY ANGIOGRAPHY;  Surgeon: Wellington Hampshire, MD;  Location: [place] [facility];  Service: Cardiovascular;  Laterality: N/A;   LEFT HEART CATH AND CORONARY ANGIOGRAPHY N/A 06/11/2021   Procedure: LEFT  HEART CATH AND CORONARY ANGIOGRAPHY;  Surgeon: Jettie Booze, MD;  Location: [place] [facility];  Service: Cardiovascular;  Laterality: N/A;    Social History   Socioeconomic History   Marital status: Married    Spouse name: Amethyst   Number of children: 8   Years of education: 13   Highest education level: Some college, no degree  Occupational History    Comment: disabled  Tobacco Use   Smoking status: Former    Packs/day:  0.50    Years: 10.00    Pack years: 5.00    Types: Cigarettes    Quit date: 04/11/2008    Years since quitting: 13.2   Smokeless tobacco: Never  Vaping Use   Vaping Use: Never used  Substance and Sexual Activity   Alcohol use: No    Alcohol/week: 0.0 standard drinks   Drug use: No   Sexual activity: Yes    Partners: Female    Birth control/protection: None  Other Topics Concern   Not on file  Social History Narrative   Lives with wife. Does not work.  On disability.    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 Provided crisis assistance program information to assist with bills       Has issues getting food- gets $36/month in food stamps- already has list of food pantries but has not tried them- encouraged pt to try food pantries and reach out to clinic for help if needed   Social Determinants of Health   Financial Resource Strain: Low Risk    Difficulty of Paying Living Expenses: Not hard at all  Food Insecurity: No Food Insecurity   Worried About Charity fundraiser in the Last Year: Never true   [place] in the Last Year: Never true  Transportation Needs: No Transportation Needs   Lack of Transportation (Medical): No   Lack of Transportation (Non-Medical): No  Physical Activity: Inactive   Days of Exercise per Week: 0 days   Minutes of Exercise per Session: 0 min  Stress: No Stress Concern Present   Feeling of Stress : Not at all  Social Connections: Moderately Isolated   Frequency of Communication with Friends and Family: Once a week   Frequency of Social Gatherings with Friends and Family: Never   Attends Religious Services: More than 4 times per year   Active Member of [employer] or Organizations: Yes   Attends Music therapist: More than 4 times per year   Marital Status:  Separated  Intimate Partner Violence: Not At Risk   Fear of Current or Ex-Partner: No   Emotionally Abused: No   Physically Abused: No   Sexually Abused: No    Current Outpatient Medications on File Prior to Visit  Medication Sig Dispense Refill   Accu-Chek Softclix Lancets lancets USE AS INSTRUCTED 100 each 12   acetaminophen (TYLENOL) 650 MG CR tablet Take 1,300 mg by mouth in the morning and at bedtime.     acitretin (SORIATANE) 25 MG capsule Take 2 capsules by mouth daily. 62 capsule 0   albuterol (VENTOLIN HFA) 108 (90 Base) MCG/ACT inhaler INHALE 2 PUFFS INTO THE LUNGS EVERY 6 HOURS AS NEEDED FOR WHEEZING OR SHORTNESS OF BREATH. 8.5 g 1   allopurinol (ZYLOPRIM) 100 MG tablet  Take 2 tablets (200 mg total) by mouth daily. 60 tablet 0   amLODipine (NORVASC) 5 MG tablet Take 1 tablet (5 mg total) by mouth daily. (Patient taking differently: Take 5 mg by mouth every evening.) 90 tablet 1   aspirin (EQ ASPIRIN ADULT LOW DOSE) 81 MG EC tablet Take 1 tablet (81 mg total) by mouth daily. TAKE 1 TABLET BY MOUTH ONCE DAILY. SWALLOW WHOLE 30 tablet 12   atorvastatin (LIPITOR) 40 MG tablet Take 1 tablet (40 mg total) by mouth daily. 90 tablet 3   carvedilol (COREG) 12.5 MG tablet TAKE 1 TABLET BY MOUTH TWO TIMES DAILY WITH MEALS 180 tablet 1   clopidogrel (PLAVIX) 75 MG tablet Take 1 tablet (75 mg total) by mouth daily. 30 tablet 5   Continuous Blood Gluc Receiver (DEXCOM G6 RECEIVER) DEVI Use as instructed to monitor blood sugar daily 1 each 0   Continuous Blood Gluc Sensor (DEXCOM G6 SENSOR) MISC Use as instructed to monitor blood sugar daily. Change every 10 days. 9 each 3   Continuous Blood Gluc Transmit (DEXCOM G6 TRANSMITTER) MISC Use as instructed to monitor blood sugar daily. Change every 90 days. 1 each 3   Crisaborole (EUCRISA) 2 % OINT Apply to affected area twice a day 60 g 0   diclofenac Sodium (VOLTAREN) 1 % GEL Apply 4 g topically 4 (four) times daily. (Patient taking differently:  Apply 4 g topically 4 (four) times daily as needed (pain).) 100 g 0   DULoxetine (CYMBALTA) 20 MG capsule TAKE 1 CAPSULE BY MOUTH ONCE A DAY 90 capsule 1   famotidine (PEPCID) 20 MG tablet TAKE 1 TABLET BY MOUTH 2 TIMES DAILY 60 tablet 2   fexofenadine (ALLEGRA) 180 MG tablet Take 180 mg by mouth every evening.     fluticasone (FLONASE) 50 MCG/ACT nasal spray PLACE 1 SPRAY INTO BOTH NOSTRILS DAILY AS NEEDED FOR ALLERGIES OR RHINITIS. (Patient taking differently: Place 1 spray into both nostrils at bedtime.) 16 g 1   furosemide (LASIX) 40 MG tablet Take 1 tablet (40 mg total) by mouth every evening. 90 tablet 3   furosemide (LASIX) 80 MG tablet Take 1 tablet (80 mg total) by mouth every morning. 90 tablet 3   gabapentin (NEURONTIN) 300 MG capsule Take 1 capsule (300 mg total) by mouth at bedtime. 30 capsule 3   glucose blood test strip USE TO TEST 4 TIMES DAILY. (Patient taking differently: USE TO TEST 4 TIMES DAILY.) 400 strip 3   Insulin Pen Needle (UNIFINE PENTIPS) 32G X 4 MM MISC USE AS DIRECTED DAILY 100 each 0   isosorbide mononitrate (IMDUR) 60 MG 24 hr tablet Take 1.5 tablets (90 mg total) by mouth daily. 135 tablet 3   melatonin 5 MG TABS Take 5 mg by mouth at bedtime.     naloxone (NARCAN) nasal spray 4 mg/0.1 mL Place in nostril in the event of suspected overdose on Narcotic medication (Oxycodone) 1 each 0   nitroGLYCERIN (NITROSTAT) 0.4 MG SL tablet Place 1 tablet (0.4 mg total) under the tongue every 5 (five) minutes as needed for chest pain. 25 tablet 2   oxyCODONE-acetaminophen (PERCOCET/ROXICET) 5-325 MG tablet Take 1 tablet by mouth 4 (four) times daily.     oxymetazoline (12 HOUR DECONGESTANT) 0.05 % nasal spray Place 1 spray into both nostrils 2 (two) times daily. 30 mL 0   polyethylene glycol (MIRALAX / GLYCOLAX) 17 g packet Take 17 g by mouth daily as needed for moderate constipation or severe constipation.  14 each 0   potassium chloride SA (KLOR-CON) 20 MEQ tablet Take 1 and 1/2  tablets (30 mEq total) by mouth daily. (Patient taking differently: Take 10-20 mEq by mouth See admin instructions. Take 1 tablet (20 meq) by mouth in the morning & take 0.5 tablet (10 meq) by mouth in the evening.) 90 tablet 3   Secukinumab (COSENTYX SENSOREADY PEN) 150 MG/ML SOAJ INJECT ONE PEN SUBCUTANEOUSLY EVERY 4 WEEKS. REFRIGERATE. ALLOW 15 TO 30 MINUTES AT ROOM TEMP PRIOR TO ADMINISTRATION. 3 mL 1   Tafamidis 61 MG CAPS TAKE 1 CAPSULE (61 MG) BY MOUTH DAILY. 30 capsule 11   triamcinolone cream (KENALOG) 0.1 % APPLY TO THE AFFECTED AREA(S) TOPICALLY 2 TIMES DAILY AS DIRECTED (Patient taking differently: Apply 1 application topically daily.) 454 g 0   UNIFINE PENTIPS 32G X 4 MM MISC USE AS DIRECTED DAILY 100 each 0   [DISCONTINUED] hydrALAZINE (APRESOLINE) 25 MG tablet Take 1 tablet (25 mg total) by mouth 3 (three) times daily. 90 tablet 3   [DISCONTINUED] metoprolol tartrate (LOPRESSOR) 100 MG tablet Take 1 tablet (100 mg total) by mouth once for 1 dose. 1 tablet 0   [DISCONTINUED] ramelteon (ROZEREM) 8 MG tablet TAKE 1 TABLET BY MOUTH AT BEDTIME 30 tablet 1   No current facility-administered medications on file prior to visit.    Allergies  Allergen Reactions   Nsaids Other (See Comments)    Stomach pains. Ulcers - stated by patient    Jardiance [Empagliflozin] Nausea And Vomiting    Family History  Problem Relation Age of Onset   Hypertension Mother    Diabetes Mother    COPD Mother    Lung cancer Mother        Smoker    Hypertension Father    Diabetes Father    Heart Problems Father    COPD Father    Colon cancer Neg Hx    Stomach cancer Neg Hx     BP 120/64    Pulse 75    Ht 5\' 9"  (1.753 m)    Wt 222 lb 9.6 oz (101 kg)    SpO2 96%    BMI 32.87 kg/m      Review of Systems Denies N/HB    Objective:   Physical Exam    A1c=7.8%    Assessment & Plan:  Insulin-requiring type 2 DM: uncontrolled.   Patient Instructions  check your blood sugar twice a day.  vary  the time of day when you check, between before the 3 meals, and at bedtime.  also check if you have symptoms of your blood sugar being too high or too low.  please keep a record of the readings and bring it to your next appointment here (or you can bring the meter itself).  You can write it on any piece of paper.  please call us sooner if your blood sugar goes below 70, or if most of your readings are over 200.   Please reduce the insulin to 80 units with breakfast. I have sent a prescription to your pharmacy, to increase the [facility].   On this type of insulin schedule, you should eat meals on a regular schedule (especially lunch).  If a meal is missed or significantly delayed, your blood sugar could go low.   Please come back for a follow-up appointment in 2 months.

## 2021-07-23 NOTE — Patient Instructions (Addendum)
check your blood sugar twice a day.  vary the time of day when you check, between before the 3 meals, and at bedtime.  also check if you have symptoms of your blood sugar being too high or too low.  please keep a record of the readings and bring it to your next appointment here (or you can bring the meter itself).  You can write it on any piece of paper.  please call us sooner if your blood sugar goes below 70, or if most of your readings are over 200.   Please reduce the insulin to 80 units with breakfast. I have sent a prescription to your pharmacy, to increase the [facility].   On this type of insulin schedule, you should eat meals on a regular schedule (especially lunch).  If a meal is missed or significantly delayed, your blood sugar could go low.   Please come back for a follow-up appointment in 2 months.

## 2021-07-24 ENCOUNTER — Encounter: Payer: Medicare Other | Admitting: Internal Medicine

## 2021-07-25 ENCOUNTER — Telehealth (HOSPITAL_COMMUNITY): Payer: Self-pay

## 2021-07-25 ENCOUNTER — Encounter (HOSPITAL_COMMUNITY): Payer: Self-pay

## 2021-07-25 ENCOUNTER — Other Ambulatory Visit (HOSPITAL_COMMUNITY): Payer: Self-pay

## 2021-07-25 DIAGNOSIS — G629 Polyneuropathy, unspecified: Secondary | ICD-10-CM | POA: Diagnosis not present

## 2021-07-25 DIAGNOSIS — M2550 Pain in unspecified joint: Secondary | ICD-10-CM | POA: Diagnosis not present

## 2021-07-25 DIAGNOSIS — M1990 Unspecified osteoarthritis, unspecified site: Secondary | ICD-10-CM | POA: Diagnosis not present

## 2021-07-25 DIAGNOSIS — L409 Psoriasis, unspecified: Secondary | ICD-10-CM | POA: Diagnosis not present

## 2021-07-25 DIAGNOSIS — Z79899 Other long term (current) drug therapy: Secondary | ICD-10-CM | POA: Diagnosis not present

## 2021-07-25 DIAGNOSIS — M1A09X0 Idiopathic chronic gout, multiple sites, without tophus (tophi): Secondary | ICD-10-CM | POA: Diagnosis not present

## 2021-07-25 MED ORDER — COSENTYX SENSOREADY PEN 150 MG/ML ~~LOC~~ SOAJ
SUBCUTANEOUS | 1 refills | Status: AC
  Filled 2021-08-23: qty 1, 28d supply, fill #0
  Filled 2021-09-11: qty 1, 28d supply, fill #1
  Filled 2021-10-21: qty 1, 28d supply, fill #2

## 2021-07-25 MED ORDER — ALLOPURINOL 100 MG PO TABS
200.0000 mg | ORAL_TABLET | Freq: Every day | ORAL | 0 refills | Status: DC
  Filled 2021-07-29: qty 60, 30d supply, fill #0

## 2021-07-25 MED ORDER — LEFLUNOMIDE 10 MG PO TABS
10.0000 mg | ORAL_TABLET | Freq: Every day | ORAL | 5 refills | Status: AC
  Filled 2021-08-06: qty 30, 30d supply, fill #0
  Filled 2021-09-02: qty 30, 30d supply, fill #1
  Filled 2021-09-23: qty 30, 30d supply, fill #2

## 2021-07-25 NOTE — Telephone Encounter (Signed)
Attempted to call patient in regards to Cardiac Rehab -unable to leave a message, VM box is full. Mailed letter.

## 2021-07-25 NOTE — Telephone Encounter (Signed)
Pt insurance is active and benefits verified through [facility] Medicare Co-pay 0, DED 0/0 met, out of pocket $7,550/$0 met, co-insurance 0%. no pre-authorization required. Passport, 07/25/2021_0 :20pm, REF# [id number]   2ndary insurance is active and benefits verified through Medicaid. Co-pay 0, DED 0/0 met, out of pocket 0/0 met, co-insurance 0%. No pre-authorization required. Passport, 07/25/2021_1 :30pm, REF# [id number]

## 2021-07-25 NOTE — Telephone Encounter (Signed)
Patient returned phone call and was interested in participating in the Cardiac Rehab Program.  Patient will come in for orientation on 08/01/2021@1 :15pm and will attend the 3:00pm exercise class.   Tourist information centre manager.

## 2021-07-26 ENCOUNTER — Other Ambulatory Visit (HOSPITAL_COMMUNITY): Payer: Self-pay

## 2021-07-29 ENCOUNTER — Other Ambulatory Visit (HOSPITAL_COMMUNITY): Payer: Self-pay

## 2021-07-29 ENCOUNTER — Other Ambulatory Visit: Payer: Self-pay | Admitting: Internal Medicine

## 2021-07-29 ENCOUNTER — Other Ambulatory Visit (HOSPITAL_COMMUNITY): Payer: Self-pay | Admitting: Cardiology

## 2021-07-29 DIAGNOSIS — Z87442 Personal history of urinary calculi: Secondary | ICD-10-CM | POA: Diagnosis not present

## 2021-07-29 DIAGNOSIS — R300 Dysuria: Secondary | ICD-10-CM | POA: Diagnosis not present

## 2021-07-29 DIAGNOSIS — E1142 Type 2 diabetes mellitus with diabetic polyneuropathy: Secondary | ICD-10-CM

## 2021-07-29 MED ORDER — TAMSULOSIN HCL 0.4 MG PO CAPS
0.4000 mg | ORAL_CAPSULE | Freq: Every evening | ORAL | 0 refills | Status: AC
  Filled 2021-07-29: qty 90, 90d supply, fill #0

## 2021-07-29 MED ORDER — POTASSIUM CHLORIDE CRYS ER 20 MEQ PO TBCR
30.0000 meq | EXTENDED_RELEASE_TABLET | Freq: Every day | ORAL | 1 refills | Status: DC
  Filled 2021-07-29: qty 90, 60d supply, fill #0

## 2021-07-29 MED ORDER — GABAPENTIN 300 MG PO CAPS
300.0000 mg | ORAL_CAPSULE | Freq: Every day | ORAL | 3 refills | Status: DC
  Filled 2021-07-29: qty 30, 30d supply, fill #0

## 2021-07-29 MED ORDER — FAMOTIDINE 20 MG PO TABS
ORAL_TABLET | Freq: Two times a day (BID) | ORAL | 2 refills | Status: AC
  Filled 2021-07-29: qty 60, 30d supply, fill #0
  Filled 2021-09-02: qty 60, 30d supply, fill #1
  Filled 2021-09-23: qty 60, 30d supply, fill #2

## 2021-07-29 MED FILL — Tafamidis Cap 61 MG: ORAL | 30 days supply | Qty: 30 | Fill #8 | Status: AC

## 2021-07-29 NOTE — Telephone Encounter (Signed)
Will forward to provider  

## 2021-07-30 ENCOUNTER — Telehealth (HOSPITAL_COMMUNITY): Payer: Self-pay

## 2021-07-30 ENCOUNTER — Other Ambulatory Visit (HOSPITAL_COMMUNITY): Payer: Self-pay

## 2021-07-31 ENCOUNTER — Other Ambulatory Visit (HOSPITAL_COMMUNITY): Payer: Self-pay

## 2021-07-31 ENCOUNTER — Telehealth (HOSPITAL_COMMUNITY): Payer: Self-pay

## 2021-07-31 ENCOUNTER — Ambulatory Visit (INDEPENDENT_AMBULATORY_CARE_PROVIDER_SITE_OTHER): Payer: Medicare Other | Admitting: Podiatry

## 2021-07-31 ENCOUNTER — Other Ambulatory Visit: Payer: Self-pay

## 2021-07-31 DIAGNOSIS — G4733 Obstructive sleep apnea (adult) (pediatric): Secondary | ICD-10-CM | POA: Diagnosis not present

## 2021-07-31 DIAGNOSIS — B351 Tinea unguium: Secondary | ICD-10-CM

## 2021-07-31 DIAGNOSIS — M79675 Pain in left toe(s): Secondary | ICD-10-CM

## 2021-07-31 DIAGNOSIS — M79674 Pain in right toe(s): Secondary | ICD-10-CM

## 2021-07-31 DIAGNOSIS — E1142 Type 2 diabetes mellitus with diabetic polyneuropathy: Secondary | ICD-10-CM | POA: Diagnosis not present

## 2021-07-31 DIAGNOSIS — G4700 Insomnia, unspecified: Secondary | ICD-10-CM | POA: Diagnosis not present

## 2021-08-01 ENCOUNTER — Encounter (HOSPITAL_COMMUNITY): Payer: Self-pay

## 2021-08-01 ENCOUNTER — Encounter (HOSPITAL_COMMUNITY)
Admission: RE | Admit: 2021-08-01 | Discharge: 2021-08-01 | Disposition: A | Discharge status: Home / Self Care | Payer: Medicare Other | Source: Ambulatory Visit | Attending: Cardiology | Admitting: Cardiology

## 2021-08-01 VITALS — BP 124/62 | HR 86 | Ht 65.25 in | Wt 221.1 lb

## 2021-08-01 DIAGNOSIS — Z955 Presence of coronary angioplasty implant and graft: Secondary | ICD-10-CM | POA: Insufficient documentation

## 2021-08-01 DIAGNOSIS — Z79899 Other long term (current) drug therapy: Secondary | ICD-10-CM | POA: Insufficient documentation

## 2021-08-01 NOTE — Progress Notes (Signed)
Cardiac Rehab Medication Review by a Nurse  Does the patient  feel that his/her medications are working for him/her?  yes  Has the patient been experiencing any side effects to the medications prescribed?  no  Does the patient measure his/her own blood pressure or blood glucose at home?  yes   Does the patient have any problems obtaining medications due to transportation or finances?   no  Understanding of regimen: fair Understanding of indications: fair Potential of compliance: good    Nurse comments: Marco [name] is taking his medications as prescribed and has a fair understanding of what his medications are for. Marco [name] has a fair understanding of what his medications are for. Marco [name] has a Dexcom 6 CGM. Marco [name] does not check his blood pressures on a regular basis.    Christa See [facility] RN 08/01/2021 1:46 PM

## 2021-08-01 NOTE — Progress Notes (Signed)
Cardiac Individual Treatment Plan  Patient Details  Name: SKYY MCKNIGHT MRN: [id number] Date of Birth: [birth year] Referring Provider:   Flowsheet Row CARDIAC REHAB PHASE II ORIENTATION from 08/01/2021 in [place]  Referring Provider Dr. [name], MD       Initial Encounter Date:  [place] from 08/01/2021 in [place]  Date 08/01/21       Visit Diagnosis: 06/11/21 S/P DES, PTCA LAD  Patient's Home Medications on Admission:  Current Outpatient Medications:    acetaminophen (TYLENOL) 650 MG CR tablet, Take 1,300 mg by mouth in the morning and at bedtime., Disp: , Rfl:    acitretin (SORIATANE) 25 MG capsule, Take 2 capsules by mouth daily., Disp: 62 capsule, Rfl: 0   albuterol (VENTOLIN HFA) 108 (90 Base) MCG/ACT inhaler, INHALE 2 PUFFS INTO THE LUNGS EVERY 6 HOURS AS NEEDED FOR WHEEZING OR SHORTNESS OF BREATH., Disp: 8.5 g, Rfl: 1   allopurinol (ZYLOPRIM) 100 MG tablet, Take 2 tablets by mouth daily., Disp: 60 tablet, Rfl: 0   amLODipine (NORVASC) 5 MG tablet, Take 1 tablet (5 mg total) by mouth daily. (Patient taking differently: Take 5 mg by mouth every evening.), Disp: 90 tablet, Rfl: 1   aspirin (EQ ASPIRIN ADULT LOW DOSE) 81 MG EC tablet, Take 1 tablet (81 mg total) by mouth daily. TAKE 1 TABLET BY MOUTH ONCE DAILY. SWALLOW WHOLE, Disp: 30 tablet, Rfl: 12   atorvastatin (LIPITOR) 40 MG tablet, Take 1 tablet (40 mg total) by mouth daily., Disp: 90 tablet, Rfl: 3   carvedilol (COREG) 12.5 MG tablet, TAKE 1 TABLET BY MOUTH TWO TIMES DAILY WITH MEALS, Disp: 180 tablet, Rfl: 1   clopidogrel (PLAVIX) 75 MG tablet, Take 1 tablet (75 mg total) by mouth daily., Disp: 30 tablet, Rfl: 5   DULoxetine (CYMBALTA) 20 MG capsule, TAKE 1 CAPSULE BY MOUTH ONCE A DAY, Disp: 90 capsule, Rfl: 1   fexofenadine (ALLEGRA) 180 MG tablet, Take 180 mg by mouth every evening., Disp: , Rfl:     fluticasone (FLONASE) 50 MCG/ACT nasal spray, PLACE 1 SPRAY INTO BOTH NOSTRILS DAILY AS NEEDED FOR ALLERGIES OR RHINITIS., Disp: 16 g, Rfl: 1   furosemide (LASIX) 40 MG tablet, Take 1 tablet (40 mg total) by mouth every evening., Disp: 90 tablet, Rfl: 3   furosemide (LASIX) 80 MG tablet, Take 1 tablet (80 mg total) by mouth every morning., Disp: 90 tablet, Rfl: 3   Insulin Lispro Prot & Lispro (HUMALOG MIX 50/50 KWIKPEN) (50-50) 100 UNIT/ML Kwikpen, Inject 80 Units into the skin daily with breakfast., Disp: 90 mL, Rfl: 3   isosorbide mononitrate (IMDUR) 60 MG 24 hr tablet, Take 1.5 tablets (90 mg total) by mouth daily., Disp: 135 tablet, Rfl: 3   Melatonin 10 MG TABS, Take 10 mg by mouth at bedtime., Disp: , Rfl:    naloxone (NARCAN) nasal spray 4 mg/0.1 mL, Place in nostril in the event of suspected overdose on Narcotic medication (Oxycodone), Disp: 1 each, Rfl: 0   nitroGLYCERIN (NITROSTAT) 0.4 MG SL tablet, Place 1 tablet (0.4 mg total) under the tongue every 5 (five) minutes as needed for chest pain., Disp: 25 tablet, Rfl: 2   oxyCODONE-acetaminophen (PERCOCET/ROXICET) 5-325 MG tablet, Take 1 tablet by mouth 4 (four) times daily., Disp: , Rfl:    oxymetazoline (12 HOUR DECONGESTANT) 0.05 % nasal spray, Place 1 spray into both nostrils 2 (two) times daily. (Patient taking differently: Place  1 spray into both nostrils 2 (two) times daily as needed for congestion.), Disp: 30 mL, Rfl: 0   Secukinumab (COSENTYX SENSOREADY PEN) 150 MG/ML SOAJ, INJECT ONE PEN SUBCUTANEOUSLY EVERY 4 WEEKS. REFRIGERATE. ALLOW 15 TO 30 MINUTES AT ROOM TEMP PRIOR TO ADMINISTRATION., Disp: 3 mL, Rfl: 1   Tafamidis 61 MG CAPS, TAKE 1 CAPSULE (61 MG) BY MOUTH DAILY., Disp: 30 capsule, Rfl: 11   tirzepatide (MOUNJARO) 5 MG/0.5ML Pen, Inject 5 mg into the skin once a week., Disp: 6 mL, Rfl: 3   triamcinolone cream (KENALOG) 0.1 %, APPLY TO THE AFFECTED AREA(S) TOPICALLY 2 TIMES DAILY AS DIRECTED, Disp: 454 g, Rfl: 0   Accu-Chek  Softclix Lancets lancets, USE AS INSTRUCTED, Disp: 100 each, Rfl: 12   Continuous Blood Gluc Receiver (DEXCOM G6 RECEIVER) DEVI, Use as instructed to monitor blood sugar daily, Disp: 1 each, Rfl: 0   Continuous Blood Gluc Sensor (DEXCOM G6 SENSOR) MISC, Use as instructed to monitor blood sugar daily. Change every 10 days., Disp: 9 each, Rfl: 3   Continuous Blood Gluc Transmit (DEXCOM G6 TRANSMITTER) MISC, Use as instructed to monitor blood sugar daily. Change every 90 days., Disp: 1 each, Rfl: 3   Crisaborole (EUCRISA) 2 % OINT, Apply to affected area twice a day, Disp: 60 g, Rfl: 0   diclofenac Sodium (VOLTAREN) 1 % GEL, Apply 4 g topically 4 (four) times daily. (Patient taking differently: Apply 4 g topically 4 (four) times daily as needed (pain).), Disp: 100 g, Rfl: 0   famotidine (PEPCID) 20 MG tablet, TAKE 1 TABLET BY MOUTH 2 TIMES DAILY, Disp: 60 tablet, Rfl: 2   gabapentin (NEURONTIN) 300 MG capsule, Take 1 capsule (300 mg total) by mouth at bedtime., Disp: 30 capsule, Rfl: 3   glucose blood test strip, USE TO TEST 4 TIMES DAILY., Disp: 400 strip, Rfl: 3   Insulin Pen Needle (UNIFINE PENTIPS) 32G X 4 MM MISC, USE AS DIRECTED DAILY, Disp: 100 each, Rfl: 0   leflunomide (ARAVA) 10 MG tablet, Take 1 tablet (10 mg total) by mouth daily., Disp: 30 tablet, Rfl: 5   potassium chloride SA (KLOR-CON M) 20 MEQ tablet, Take 1 and 1/2 tablets (30 mEq total) by mouth daily., Disp: 90 tablet, Rfl: 1   tamsulosin (FLOMAX) 0.4 MG CAPS capsule, Take 1 capsule (0.4 mg total) by mouth at bedtime., Disp: 90 capsule, Rfl: 0   UNIFINE PENTIPS 32G X 4 MM MISC, USE AS DIRECTED DAILY, Disp: 100 each, Rfl: 0  Past Medical History: Past Medical History:  Diagnosis Date   Benign colon polyp 08/01/2013   [facility] in [place]. large base tranverse colon polyp was biopsied. polyp was benign with minimal surface hyperplastic change.   Chronic combined systolic and diastolic CHF (congestive heart failure)  (HCC)    Chronic pain    CKD (chronic kidney disease), stage III (HCC)    Diabetes mellitus without complication ([place])    Diverticulosis    Esophageal hiatal hernia 07/29/2013   confirmed on EGD    Esophageal stricture    Essential hypertension    Gastritis 07/29/2013   confirmed on EGD, bx done an negative for intestinal metaplasia, dsyplasia or H. pylori. normal gastric emptying study done 07/13/2013.   GERD (gastroesophageal reflux disease)    Morbid obesity (HCC)    Non-obstructive CAD    Coronary CTA 04/2021 showed coronary calcium score of 535 which is 93rd % for age and sex matched controls.  There is mild CAD  with mild calcified plaque in the distal LAD 25-49% and also in the ostial D3.  There is minimal calcified plaque in the mid LCx and mild mixed plaque in the pRCA with 25-49% stenosis.   Persistent atrial fibrillation ([place])    a. 02/2013 s/p rfca in [place], [place]-->prev on Xarelto, d/c'd 2/2 anemia, ? GIB.   Rheumatoid arthritis ([place])    Sigmoid diverticulosis 08/01/2013   confirmed on colonscopy. record scanned into chart    Tobacco Use: Social History   Tobacco Use  Smoking Status Former   Packs/day: 0.50   Years: 10.00   Pack years: 5.00   Types: Cigarettes   Quit date: 04/11/2008   Years since quitting: 13.3  Smokeless Tobacco Never    Labs: Recent Review Flowsheet Data     Labs for ITP Cardiac and Pulmonary Rehab Latest Ref Rng & Units 10/17/2020 12/28/2020 01/11/2021 04/16/2021 07/23/2021   Cholestrol 100 - 199 mg/dL - - 97(L) - -   LDLCALC 0 - 99 mg/dL - - 41 - -   HDL >39 mg/dL - - 27(L) - -   Trlycerides 0 - 149 mg/dL - - 172(H) - -   Hemoglobin A1c 4.0 - 5.6 % 8.8(A) 7.9(A) - 9.1(A) 7.8(A)   PHART 7.350 - 7.450 - - - - -   PCO2ART 32.0 - 48.0 mmHg - - - - -   HCO3 20.0 - 28.0 mmol/L - - - - -   TCO2 22 - 32 mmol/L - - - - -   ACIDBASEDEF 0.0 - 2.0 mmol/L - - - - -   O2SAT % - - - - -       Capillary Blood Glucose: Lab Results  Component Value Date    GLUCAP 124 (H) 06/11/2021   GLUCAP 134 (H) 01/03/2021   GLUCAP 167 (H) 05/24/2020   GLUCAP 155 (H) 05/24/2020   GLUCAP 160 (H) 05/23/2020     Exercise Target Goals: Exercise Program Goal: Individual exercise prescription set using results from initial 6 min walk test and THRR while considering  patients activity barriers and safety.   Exercise Prescription Goal: Starting with aerobic activity 30 plus minutes a day, 3 days per week for initial exercise prescription. Provide home exercise prescription and guidelines that participant acknowledges understanding prior to discharge.  Activity Barriers & Risk Stratification:  Activity Barriers & Cardiac Risk Stratification - 08/01/21 1441       Activity Barriers & Cardiac Risk Stratification   Activity Barriers Neck/Spine Problems;Arthritis;Back Problems;Joint Problems;Balance Concerns;Deconditioning;History of Falls;Assistive Device    Cardiac Risk Stratification High             6 Minute Walk:  6 Minute Walk     Row Name 08/01/21 1433         6 Minute Walk   Phase Initial     Distance 724 feet     Walk Time 6 minutes     # of Rest Breaks 1  1 rest break from 5:20-5:45     MPH 1.37     METS 1.41     RPE 11     Perceived Dyspnea  1     VO2 Peak 4.93     Symptoms Yes (comment)     Comments 6/10 right leg pain, rest break from 5:20-5:45     Resting HR 82 bpm     Resting BP 124/62     Resting Oxygen Saturation  98 %     Exercise Oxygen Saturation  during 6 min walk 97 %     Max Ex. HR 86 bpm     Max Ex. BP 124/62     2 Minute Post BP 113/69              Oxygen Initial Assessment:   Oxygen Re-Evaluation:   Oxygen Discharge (Final Oxygen Re-Evaluation):   Initial Exercise Prescription:  Initial Exercise Prescription - 08/01/21 1400       Date of Initial Exercise RX and Referring Provider   Date 08/01/21    Referring Provider Dr. [name], MD    Expected Discharge Date 09/27/20      NuStep    Level 1    SPM 65    Minutes 20    METs 1.5      Prescription Details   Frequency (times per week) 3    Duration Progress to 30 minutes of continuous aerobic without signs/symptoms of physical distress      Intensity   THRR 40-80% of Max Heartrate 62-123    Ratings of Perceived Exertion 11-13    Perceived Dyspnea 0-4      Progression   Progression Continue progressive overload as per policy without signs/symptoms or physical distress.      Resistance Training   Training Prescription Yes    Weight 2lbs    Reps 10-15             Perform Capillary Blood Glucose checks as needed.  Exercise Prescription Changes:   Exercise Comments:   Exercise Goals and Review:   Exercise Goals     Row Name 08/01/21 1450             Exercise Goals   Increase Physical Activity Yes       Intervention Provide advice, education, support and counseling about physical activity/exercise needs.;Develop an individualized exercise prescription for aerobic and resistive training based on initial evaluation findings, risk stratification, comorbidities and participant's personal goals.       Expected Outcomes Short Term: Attend rehab on a regular basis to increase amount of physical activity.;Long Term: Add in home exercise to make exercise part of routine and to increase amount of physical activity.;Long Term: Exercising regularly at least 3-5 days a week.       Increase Strength and Stamina Yes       Intervention Provide advice, education, support and counseling about physical activity/exercise needs.;Develop an individualized exercise prescription for aerobic and resistive training based on initial evaluation findings, risk stratification, comorbidities and participant's personal goals.       Expected Outcomes Short Term: Increase workloads from initial exercise prescription for resistance, speed, and METs.;Short Term: Perform resistance training exercises routinely during rehab and add in  resistance training at home;Long Term: Improve cardiorespiratory fitness, muscular endurance and strength as measured by increased METs and functional capacity (6MWT)       Able to understand and use rate of perceived exertion (RPE) scale Yes       Intervention Provide education and explanation on how to use RPE scale       Expected Outcomes Short Term: Able to use RPE daily in rehab to express subjective intensity level;Long Term:  Able to use RPE to guide intensity level when exercising independently       Knowledge and understanding of Target Heart Rate Range (THRR) Yes       Intervention Provide education and explanation of THRR including how the numbers were predicted and where they are located for reference  Expected Outcomes Short Term: Able to state/look up THRR;Long Term: Able to use THRR to govern intensity when exercising independently;Short Term: Able to use daily as guideline for intensity in rehab       Understanding of Exercise Prescription Yes       Intervention Provide education, explanation, and written materials on patient's individual exercise prescription       Expected Outcomes Short Term: Able to explain program exercise prescription;Long Term: Able to explain home exercise prescription to exercise independently                Exercise Goals Re-Evaluation :    Discharge Exercise Prescription (Final Exercise Prescription Changes):   Nutrition:  Target Goals: Understanding of nutrition guidelines, daily intake of sodium 1500mg , cholesterol 200mg , calories 30% from fat and 7% or less from saturated fats, daily to have 5 or more servings of fruits and vegetables.  Biometrics:   Post Biometrics - 08/01/21 1444        Post  Biometrics   Waist Circumference 48 inches    Hip Circumference 45.5 inches    Waist to Hip Ratio 1.05 %    Triceps Skinfold 20 mm    % Body Fat 36.2 %    Grip Strength 28 kg    Flexibility --   Pt unable to reach   Single Leg Stand  2.7 seconds             Nutrition Therapy Plan and Nutrition Goals:   Nutrition Assessments:  MEDIFICTS Score Key: ?70 Need to make dietary changes  40-70 Heart Healthy Diet ? 40 Therapeutic Level Cholesterol Diet   Picture Your Plate Scores: <83 Unhealthy dietary pattern with much room for improvement. 41-50 Dietary pattern unlikely to meet recommendations for good health and room for improvement. 51-60 More healthful dietary pattern, with some room for improvement.  >60 Healthy dietary pattern, although there may be some specific behaviors that could be improved.    Nutrition Goals Re-Evaluation:   Nutrition Goals Discharge (Final Nutrition Goals Re-Evaluation):   Psychosocial: Target Goals: Acknowledge presence or absence of significant depression and/or stress, maximize coping skills, provide positive support system. Participant is able to verbalize types and ability to use techniques and skills needed for reducing stress and depression.  Initial Review & Psychosocial Screening:  Initial Psych Review & Screening - 08/01/21 1341       Initial Review   Current issues with History of Depression      Family Dynamics   Good Support System? Yes   [name] lives alone. [name] has his two children who live in the area for support     Barriers   Psychosocial barriers to participate in program There are no identifiable barriers or psychosocial needs.      Screening Interventions   Interventions Encouraged to exercise             Quality of Life Scores:  Quality of Life - 08/01/21 1455       Quality of Life   Select Quality of Life      Quality of Life Scores   Health/Function Pre 18.37 %    Socioeconomic Pre 25.67 %    Psych/Spiritual Pre 25.71 %    Family Pre 26.4 %    GLOBAL Pre 22.47 %            Scores of 19 and below usually indicate a poorer quality of life in these areas.  A difference of  2-3 points is a  clinically meaningful difference.  A  difference of 2-3 points in the total score of the Quality of Life Index has been associated with significant improvement in overall quality of life, self-image, physical symptoms, and general health in studies assessing change in quality of life.  PHQ-9: Recent Review Flowsheet Data     Depression screen [facility] 2/9 08/01/2021 04/11/2021 04/05/2021 02/19/2021 11/28/2020   Decreased Interest 0 0 0 0 0   Down, Depressed, Hopeless 0 0 0 0 0   PHQ - 2 Score 0 0 0 0 0      Interpretation of Total Score  Total Score Depression Severity:  1-4 = Minimal depression, 5-9 = Mild depression, 10-14 = Moderate depression, 15-19 = Moderately severe depression, 20-27 = Severe depression   Psychosocial Evaluation and Intervention:   Psychosocial Re-Evaluation:   Psychosocial Discharge (Final Psychosocial Re-Evaluation):   Vocational Rehabilitation: Provide vocational rehab assistance to qualifying candidates.   Vocational Rehab Evaluation & Intervention:  Vocational Rehab - 08/01/21 1346       Initial Vocational Rehab Evaluation & Intervention   Assessment shows need for Vocational Rehabilitation No   [name] is disabled and does not need vocational rehab at this time            Education: Education Goals: Education classes will be provided on a weekly basis, covering required topics. Participant will state understanding/return demonstration of topics presented.  Learning Barriers/Preferences:  Learning Barriers/Preferences - 08/01/21 1452       Learning Barriers/Preferences   Learning Barriers Exercise Concerns   balance concerns   Learning Preferences Audio;Computer/Internet;Group Instruction;Individual Instruction;Pictoral;Skilled Demonstration;Verbal Instruction;Video;Written Material             Education Topics: Hypertension, Hypertension Reduction -Define heart disease and high blood pressure. Discus how high blood pressure affects the body and ways to reduce high blood  pressure.   Exercise and Your Heart -Discuss why it is important to exercise, the FITT principles of exercise, normal and abnormal responses to exercise, and how to exercise safely.   Angina -Discuss definition of angina, causes of angina, treatment of angina, and how to decrease risk of having angina.   Cardiac Medications -Review what the following cardiac medications are used for, how they affect the body, and side effects that may occur when taking the medications.  Medications include Aspirin, Beta blockers, calcium channel blockers, ACE Inhibitors, angiotensin receptor blockers, diuretics, digoxin, and antihyperlipidemics.   Congestive Heart Failure -Discuss the definition of CHF, how to live with CHF, the signs and symptoms of CHF, and how keep track of weight and sodium intake.   Heart Disease and Intimacy -Discus the effect sexual activity has on the heart, how changes occur during intimacy as we age, and safety during sexual activity.   Smoking Cessation / COPD -Discuss different methods to quit smoking, the health benefits of quitting smoking, and the definition of COPD.   Nutrition I: Fats -Discuss the types of cholesterol, what cholesterol does to the heart, and how cholesterol levels can be controlled.   Nutrition II: Labels -Discuss the different components of food labels and how to read food label   Heart Parts/Heart Disease and PAD -Discuss the anatomy of the heart, the pathway of blood circulation through the heart, and these are affected by heart disease.   Stress I: Signs and Symptoms -Discuss the causes of stress, how stress may lead to anxiety and depression, and ways to limit stress.   Stress II: Relaxation -Discuss different types of relaxation techniques to  limit stress.   Warning Signs of Stroke / TIA -Discuss definition of a stroke, what the signs and symptoms are of a stroke, and how to identify when someone is having stroke.   Knowledge  Questionnaire Score:  Knowledge Questionnaire Score - 08/01/21 1456       Knowledge Questionnaire Score   Pre Score 20/24             Core Components/Risk Factors/Patient Goals at Admission:  Personal Goals and Risk Factors at Admission - 08/01/21 1459       Core Components/Risk Factors/Patient Goals on Admission    Weight Management Yes;Obesity;Weight Loss    Intervention Weight Management: Develop a combined nutrition and exercise program designed to reach desired caloric intake, while maintaining appropriate intake of nutrient and fiber, sodium and fats, and appropriate energy expenditure required for the weight goal.;Weight Management: Provide education and appropriate resources to help participant work on and attain dietary goals.;Weight Management/Obesity: Establish reasonable short term and long term weight goals.;Obesity: Provide education and appropriate resources to help participant work on and attain dietary goals.    Admit Weight 221 lb 3.2 oz (100.3 kg)    Expected Outcomes Short Term: Continue to assess and modify interventions until short term weight is achieved;Long Term: Adherence to nutrition and physical activity/exercise program aimed toward attainment of established weight goal;Understanding recommendations for meals to include 15-35% energy as protein, 25-35% energy from fat, 35-60% energy from carbohydrates, less than 200mg  of dietary cholesterol, 20-35 gm of total fiber daily;Weight Loss: Understanding of general recommendations for a balanced deficit meal plan, which promotes 1-2 lb weight loss per week and includes a negative energy balance of [phone] kcal/d;Understanding of distribution of calorie intake throughout the day with the consumption of 4-5 meals/snacks    Diabetes Yes    Intervention Provide education about signs/symptoms and action to take for hypo/hyperglycemia.;Provide education about proper nutrition, including hydration, and aerobic/resistive  exercise prescription along with prescribed medications to achieve blood glucose in normal ranges: Fasting glucose 65-99 mg/dL    Expected Outcomes Short Term: Participant verbalizes understanding of the signs/symptoms and immediate care of hyper/hypoglycemia, proper foot care and importance of medication, aerobic/resistive exercise and nutrition plan for blood glucose control.;Long Term: Attainment of HbA1C < 7%.    Hypertension Yes    Intervention Provide education on lifestyle modifcations including regular physical activity/exercise, weight management, moderate sodium restriction and increased consumption of fresh fruit, vegetables, and low fat dairy, alcohol moderation, and smoking cessation.;Monitor prescription use compliance.    Expected Outcomes Short Term: Continued assessment and intervention until BP is < 140/43mm HG in hypertensive participants. < 130/1mm HG in hypertensive participants with diabetes, heart failure or chronic kidney disease.;Long Term: Maintenance of blood pressure at goal levels.    Lipids Yes    Intervention Provide education and support for participant on nutrition & aerobic/resistive exercise along with prescribed medications to achieve LDL 70mg , HDL >40mg .    Expected Outcomes Short Term: Participant states understanding of desired cholesterol values and is compliant with medications prescribed. Participant is following exercise prescription and nutrition guidelines.;Long Term: Cholesterol controlled with medications as prescribed, with individualized exercise RX and with personalized nutrition plan. Value goals: LDL < 70mg , HDL > 40 mg.    Personal Goal Other Yes    Personal Goal Short term: walk for longer, decrease SOB Long term: better heart health    Intervention will continue to monitor pt and progress workloads as tolerated without sign or symptom.    Expected  Outcomes Pt will achieve his goals             Core Components/Risk Factors/Patient Goals  Review:    Core Components/Risk Factors/Patient Goals at Discharge (Final Review):    ITP Comments:  ITP Comments     Row Name 08/01/21 1340           ITP Comments Dr [name] MD, Medical Director                Comments: [name] attended orientation on 08/01/2021 to review rules and guidelines for program.  Completed 6 minute walk test, Intitial ITP, and exercise prescription.  VSS. Telemetry-Sinus Rhythm. [name] is deconditioned and used a wheelchair for stability. [name] stopped to rest between 5:20-5:45 . [name] reported having 6/10 right leg pain which resolved with rest.Safety measures and social distancing in place per CDC guidelines.Harrell Gave RN BSN

## 2021-08-02 ENCOUNTER — Encounter: Payer: Self-pay | Admitting: Podiatry

## 2021-08-02 NOTE — Progress Notes (Signed)
°  Subjective:  Patient ID: Marco [name], male    DOB: [birth year],  MRN: [id number]  Chief Complaint  Patient presents with   Diabetes    Routine nail trim needed no other concerns voiced.    65 y.o. male returns for the above complaint.  Patient presents with thickened elongated dystrophic toenails x10.  Patient states he would like to have them debrided and he is not able to do it himself there is pain on palpation pain with ambulation.  He does not have any secondary complaints  Objective:  There were no vitals filed for this visit. Podiatric Exam: Vascular: dorsalis pedis and posterior tibial pulses are palpable bilateral. Capillary return is immediate. Temperature gradient is WNL. Skin turgor WNL  Sensorium: Decreased Semmes Weinstein monofilament test.  Decreased tactile sensation bilaterally. Nail Exam: Pt has thick disfigured discolored nails with subungual debris noted bilateral entire nail hallux through fifth toenails.  Pain on palpation to the nails. Ulcer Exam: There is no evidence of ulcer or pre-ulcerative changes or infection. Orthopedic Exam: Muscle tone and strength are WNL. No limitations in general ROM. No crepitus or effusions noted. HAV  B/L.  Hammer toes 2-5  B/L. Skin: No Porokeratosis. No infection or ulcers    Assessment & Plan:   1. Pain due to onychomycosis of toenails of both feet   2. Type 2 diabetes mellitus with polyneuropathy (HCC)       Patient was evaluated and treated and all questions answered.  Neuropathic pain -I explained to patient the etiology of neuropathic pain in setting of diabetes and various treatment options were discussed.  At this time I discussed with the patient the possible treatment options for which includes gabapentin or Lyrica.  He has just recently been started on gabapentin I encouraged him to give it some time to see if there is any improvement if there is no resolve meant he can discuss with his primary care physician to  try Lyrica.  Patient states understanding.  Onychomycosis with pain  -Nails palliatively debrided as below. -Educated on self-care  Procedure: Nail Debridement Rationale: pain  Type of Debridement: manual, sharp debridement. Instrumentation: Nail nipper, rotary burr. Number of Nails: 10  Procedures and Treatment: Consent by patient was obtained for treatment procedures. The patient understood the discussion of treatment and procedures well. All questions were answered thoroughly reviewed. Debridement of mycotic and hypertrophic toenails, 1 through 5 bilateral and clearing of subungual debris. No ulceration, no infection noted.  Return Visit-Office Procedure: Patient instructed to return to the office for a follow up visit 3 months for continued evaluation and treatment.  Boneta Lucks, DPM    No follow-ups on file.

## 2021-08-06 ENCOUNTER — Other Ambulatory Visit: Payer: Self-pay | Admitting: Internal Medicine

## 2021-08-06 ENCOUNTER — Other Ambulatory Visit (HOSPITAL_COMMUNITY): Payer: Self-pay

## 2021-08-06 MED FILL — Fluticasone Propionate Nasal Susp 50 MCG/ACT: NASAL | 30 days supply | Qty: 16 | Fill #0 | Status: AC

## 2021-08-06 NOTE — Telephone Encounter (Signed)
Pt called to report that he needs this for the pain in his joints, wants PCP to know. He says he is completely out.

## 2021-08-07 ENCOUNTER — Telehealth: Payer: Self-pay | Admitting: Cardiology

## 2021-08-07 ENCOUNTER — Other Ambulatory Visit: Payer: Self-pay

## 2021-08-07 ENCOUNTER — Other Ambulatory Visit: Payer: Medicare Other | Admitting: *Deleted

## 2021-08-07 ENCOUNTER — Other Ambulatory Visit: Payer: Self-pay | Admitting: Cardiology

## 2021-08-07 ENCOUNTER — Other Ambulatory Visit (HOSPITAL_COMMUNITY): Payer: Self-pay

## 2021-08-07 ENCOUNTER — Encounter (HOSPITAL_COMMUNITY)
Admission: RE | Admit: 2021-08-07 | Discharge: 2021-08-07 | Disposition: A | Discharge status: Home / Self Care | Payer: Medicare Other | Source: Ambulatory Visit | Attending: Cardiology | Admitting: Cardiology

## 2021-08-07 DIAGNOSIS — I493 Ventricular premature depolarization: Secondary | ICD-10-CM | POA: Diagnosis not present

## 2021-08-07 DIAGNOSIS — Z955 Presence of coronary angioplasty implant and graft: Secondary | ICD-10-CM

## 2021-08-07 DIAGNOSIS — T50905S Adverse effect of unspecified drugs, medicaments and biological substances, sequela: Secondary | ICD-10-CM

## 2021-08-07 DIAGNOSIS — E7800 Pure hypercholesterolemia, unspecified: Secondary | ICD-10-CM | POA: Diagnosis not present

## 2021-08-07 DIAGNOSIS — Z79899 Other long term (current) drug therapy: Secondary | ICD-10-CM | POA: Diagnosis not present

## 2021-08-07 LAB — GLUCOSE, CAPILLARY
Glucose-Capillary: 143 mg/dL — ABNORMAL HIGH (ref 70–99)
Glucose-Capillary: 154 mg/dL — ABNORMAL HIGH (ref 70–99)

## 2021-08-07 MED ORDER — DICLOFENAC SODIUM 1 % EX GEL
2.0000 g | Freq: Four times a day (QID) | CUTANEOUS | 1 refills | Status: AC | PRN
  Filled 2021-08-07: qty 100, 12d supply, fill #0

## 2021-08-07 NOTE — Telephone Encounter (Signed)
Called pt and reviewed Provider recommendation.  Pt will come in for BMET today for frequent PVC's. Pt will continue exercise. Pt verbalizes understanding.  Orders placed.

## 2021-08-07 NOTE — Progress Notes (Signed)
Daily Session Note  Patient Details  Name: Marco [name] MRN: [id number] Date of Birth: 01/28/1956 Referring Provider:   Flowsheet Row CARDIAC REHAB PHASE II ORIENTATION from 08/01/2021 in [place]  Referring Provider Dr. [name], MD       Encounter Date: 08/07/2021  Check In:  Session Check In - 08/07/21 1500       Check-In   Supervising physician immediately available to respond to emergencies [facility] immediately available    Physician(s) Dr. Broadus John    Location [facility]    Staff Present Barnet Pall, RN, BSN;Jetta Walker BS, ACSM EP-C, Exercise Physiologist;Olinty Celesta Aver, MS, ACSM CEP, Exercise Physiologist;David Makemson, MS, ACSM-CEP, CCRP, Exercise Physiologist    Virtual Visit No    Medication changes reported     No    Fall or balance concerns reported    No    Tobacco Cessation No Change    Warm-up and Cool-down Performed as group-led instruction    Resistance Training Performed No    VAD Patient? No    PAD/SET Patient? No      Pain Assessment   Currently in Pain? No/denies    Pain Score 0-No pain    Multiple Pain Sites No             Capillary Blood Glucose: Results for orders placed or performed during the hospital encounter of 08/07/21 (from the past 24 hour(s))  Glucose, capillary     Status: Abnormal   Collection Time: 08/07/21  2:57 PM  Result Value Ref Range   Glucose-Capillary 143 (H) 70 - 99 mg/dL  Glucose, capillary     Status: Abnormal   Collection Time: 08/07/21  3:46 PM  Result Value Ref Range   Glucose-Capillary 154 (H) 70 - 99 mg/dL   *Note: Due to a large number of results and/or encounters for the requested time period, some results have not been displayed. A complete set of results can be found in Results Review.     Exercise Prescription Changes - 08/07/21 1610       Response to Exercise   Blood Pressure (Admit) 122/80    Blood Pressure (Exercise) 114/70     Blood Pressure (Exit) 104/58    Heart Rate (Admit) 78 bpm    Heart Rate (Exercise) 89 bpm    Heart Rate (Exit) 75 bpm    Rating of Perceived Exertion (Exercise) 11    Perceived Dyspnea (Exercise) 0    Symptoms 0    Comments Pt first day in the CRP2 program    Duration Progress to 30 minutes of  aerobic without signs/symptoms of physical distress    Intensity THRR unchanged      Progression   Progression Continue to progress workloads to maintain intensity without signs/symptoms of physical distress.    Average METs 1.3      Resistance Training   Training Prescription No   No weight wednesday     NuStep   Level 1    SPM 65    Minutes 20    METs 1.3             Social History   Tobacco Use  Smoking Status Former   Packs/day: 0.50   Years: 10.00   Pack years: 5.00   Types: Cigarettes   Quit date: 04/11/2008   Years since quitting: 13.3  Smokeless Tobacco Never    Goals Met:  Exercise tolerated well No report of concerns or symptoms  today  Goals Unmet:  Not Applicable  Comments: [name]  started cardiac rehab today.  Pt tolerated light exercise without difficulty. VSS, telemetry-Sinus Rhythm with PVC's that are frequent at times, asymptomatic. Non sustained trigeminal PVC's noted  Medication list reconciled.  Cecilie Kicks NP paged and notified. Mickel Baas says she will call Dr Theodosia Blender office to order lab work. Mickel Baas said that the office will call the patient to set up. Pt denies barriers to medicaiton compliance.  PSYCHOSOCIAL ASSESSMENT:  PHQ-0. Pt exhibits positive coping skills, hopeful outlook with supportive family. No psychosocial needs identified at this time, no psychosocial interventions necessary.    Pt enjoys spending time at church.   Pt oriented to exercise equipment and routine.    Understanding verbalized. Marco [name] is aware that Dr Theodosia Blender office will contact him.Barnet Pall, RN,BSN 08/08/2021 7:51 AM    Dr. [name] is Medical Director for Cardiac  Rehab at [facility].

## 2021-08-07 NOTE — Telephone Encounter (Signed)
Called by Cardiac rehab RN that pt having more PVCs than usual.  He is on lasix.  Will ask triage to have pt have, today or tomorrow.,BMP done for PVCs.    Ok to continue to exercise.

## 2021-08-08 ENCOUNTER — Telehealth: Payer: Self-pay | Admitting: *Deleted

## 2021-08-08 ENCOUNTER — Other Ambulatory Visit (HOSPITAL_COMMUNITY): Payer: Self-pay

## 2021-08-08 DIAGNOSIS — M79662 Pain in left lower leg: Secondary | ICD-10-CM | POA: Diagnosis not present

## 2021-08-08 DIAGNOSIS — N183 Chronic kidney disease, stage 3 unspecified: Secondary | ICD-10-CM

## 2021-08-08 DIAGNOSIS — G894 Chronic pain syndrome: Secondary | ICD-10-CM | POA: Diagnosis not present

## 2021-08-08 DIAGNOSIS — M79661 Pain in right lower leg: Secondary | ICD-10-CM | POA: Diagnosis not present

## 2021-08-08 DIAGNOSIS — M5412 Radiculopathy, cervical region: Secondary | ICD-10-CM | POA: Diagnosis not present

## 2021-08-08 DIAGNOSIS — Z79899 Other long term (current) drug therapy: Secondary | ICD-10-CM

## 2021-08-08 DIAGNOSIS — M542 Cervicalgia: Secondary | ICD-10-CM | POA: Diagnosis not present

## 2021-08-08 DIAGNOSIS — E1143 Type 2 diabetes mellitus with diabetic autonomic (poly)neuropathy: Secondary | ICD-10-CM | POA: Diagnosis not present

## 2021-08-08 DIAGNOSIS — M25512 Pain in left shoulder: Secondary | ICD-10-CM | POA: Diagnosis not present

## 2021-08-08 DIAGNOSIS — G890 Central pain syndrome: Secondary | ICD-10-CM | POA: Diagnosis not present

## 2021-08-08 DIAGNOSIS — M545 Low back pain, unspecified: Secondary | ICD-10-CM | POA: Diagnosis not present

## 2021-08-08 DIAGNOSIS — M25511 Pain in right shoulder: Secondary | ICD-10-CM | POA: Diagnosis not present

## 2021-08-08 DIAGNOSIS — Z79891 Long term (current) use of opiate analgesic: Secondary | ICD-10-CM | POA: Diagnosis not present

## 2021-08-08 DIAGNOSIS — M5136 Other intervertebral disc degeneration, lumbar region: Secondary | ICD-10-CM | POA: Diagnosis not present

## 2021-08-08 LAB — BASIC METABOLIC PANEL
BUN/Creatinine Ratio: 9 — ABNORMAL LOW (ref 10–24)
BUN: 16 mg/dL (ref 8–27)
CO2: 25 mmol/L (ref 20–29)
Calcium: 9 mg/dL (ref 8.6–10.2)
Chloride: 102 mmol/L (ref 96–106)
Creatinine, Ser: 1.77 mg/dL — ABNORMAL HIGH (ref 0.76–1.27)
Glucose: 175 mg/dL — ABNORMAL HIGH (ref 70–99)
Potassium: 4.1 mmol/L (ref 3.5–5.2)
Sodium: 141 mmol/L (ref 134–144)
eGFR: 42 mL/min/{1.73_m2} — ABNORMAL LOW (ref >59)

## 2021-08-08 MED ORDER — POTASSIUM CHLORIDE CRYS ER 20 MEQ PO TBCR
20.0000 meq | EXTENDED_RELEASE_TABLET | Freq: Every day | ORAL | 11 refills | Status: AC
  Filled 2021-08-08: qty 30, 30d supply, fill #0

## 2021-08-08 MED ORDER — FUROSEMIDE 40 MG PO TABS
40.0000 mg | ORAL_TABLET | Freq: Two times a day (BID) | ORAL | 11 refills | Status: DC
  Filled 2021-08-08: qty 60, 30d supply, fill #0

## 2021-08-08 NOTE — Telephone Encounter (Signed)
-----   Message from Marco Serge, NP sent at 08/08/2021 12:49 PM EST ----- Please let pt know that K+ is stable but kidney function is mildly stressed so decrease lasix to 40 mg BID and K+ to 20 meq daily.  Watch salt intake.  Recheck BMP in 2 weeks.  Thanks.  Cecilie Kicks, FNP-C

## 2021-08-08 NOTE — Progress Notes (Signed)
QUALITY OF LIFE SCORE REVIEW  Pt completed Quality of Life survey as a participant in Cardiac Rehab.  Scores 21.0 or below are considered low.  Pt score very low in several areas Overall 22.47, Health and Function 18.37, socioeconomic 25.67, physiological and spiritual 25., family 26.40. Patient quality of life slightly altered by physical constraints which limits ability to perform as prior to recent cardiac illness. Marco [name] reports being dissatisfied with his health due to his recent hospitalization, diabetes and other things going that [name] did not want to disclose .  Offered emotional support and reassurance.  Will continue to monitor and intervene as necessary.  [name] denies being depressed and has support from his wife and children.Will continue to monitor the patient throughout  the program. Barnet Pall, RN,BSN 08/08/2021 7:59 AM

## 2021-08-08 NOTE — Telephone Encounter (Signed)
The patient has been notified of the result and verbalized understanding.  All questions (if any) were answered. Darrell Jewel, RN 08/08/2021 1:21 PM    Lasix to 40 BID, K to 20 meq sent to pharmacy  Labs ordered and scheduled  Patient verbalized understanding and agreement.

## 2021-08-09 ENCOUNTER — Telehealth (HOSPITAL_COMMUNITY): Payer: Self-pay | Admitting: Internal Medicine

## 2021-08-09 ENCOUNTER — Encounter (HOSPITAL_COMMUNITY): Payer: Medicare Other

## 2021-08-10 DIAGNOSIS — G4700 Insomnia, unspecified: Secondary | ICD-10-CM | POA: Diagnosis not present

## 2021-08-10 DIAGNOSIS — R269 Unspecified abnormalities of gait and mobility: Secondary | ICD-10-CM | POA: Diagnosis not present

## 2021-08-10 DIAGNOSIS — R2681 Unsteadiness on feet: Secondary | ICD-10-CM | POA: Diagnosis not present

## 2021-08-10 DIAGNOSIS — G4733 Obstructive sleep apnea (adult) (pediatric): Secondary | ICD-10-CM | POA: Diagnosis not present

## 2021-08-12 ENCOUNTER — Other Ambulatory Visit (HOSPITAL_COMMUNITY): Payer: Self-pay

## 2021-08-13 ENCOUNTER — Other Ambulatory Visit: Payer: Self-pay

## 2021-08-13 ENCOUNTER — Other Ambulatory Visit (HOSPITAL_COMMUNITY): Payer: Self-pay

## 2021-08-13 ENCOUNTER — Encounter: Payer: Self-pay | Admitting: Internal Medicine

## 2021-08-13 ENCOUNTER — Ambulatory Visit: Payer: Medicare Other | Admitting: Internal Medicine

## 2021-08-13 ENCOUNTER — Ambulatory Visit: Payer: Commercial Managed Care - HMO | Attending: Internal Medicine | Admitting: Internal Medicine

## 2021-08-13 VITALS — BP 137/78 | HR 79 | Resp 16 | Wt 222.2 lb

## 2021-08-13 DIAGNOSIS — N1832 Chronic kidney disease, stage 3b: Secondary | ICD-10-CM | POA: Diagnosis not present

## 2021-08-13 DIAGNOSIS — E1159 Type 2 diabetes mellitus with other circulatory complications: Secondary | ICD-10-CM

## 2021-08-13 DIAGNOSIS — E854 Organ-limited amyloidosis: Secondary | ICD-10-CM | POA: Diagnosis not present

## 2021-08-13 DIAGNOSIS — E1142 Type 2 diabetes mellitus with diabetic polyneuropathy: Secondary | ICD-10-CM

## 2021-08-13 DIAGNOSIS — Z23 Encounter for immunization: Secondary | ICD-10-CM

## 2021-08-13 DIAGNOSIS — I480 Paroxysmal atrial fibrillation: Secondary | ICD-10-CM

## 2021-08-13 DIAGNOSIS — I43 Cardiomyopathy in diseases classified elsewhere: Secondary | ICD-10-CM | POA: Diagnosis not present

## 2021-08-13 DIAGNOSIS — M1990 Unspecified osteoarthritis, unspecified site: Secondary | ICD-10-CM | POA: Diagnosis not present

## 2021-08-13 DIAGNOSIS — J329 Chronic sinusitis, unspecified: Secondary | ICD-10-CM | POA: Diagnosis not present

## 2021-08-13 DIAGNOSIS — I152 Hypertension secondary to endocrine disorders: Secondary | ICD-10-CM | POA: Diagnosis not present

## 2021-08-13 DIAGNOSIS — D849 Immunodeficiency, unspecified: Secondary | ICD-10-CM | POA: Insufficient documentation

## 2021-08-13 MED ORDER — GABAPENTIN 300 MG PO CAPS
300.0000 mg | ORAL_CAPSULE | Freq: Two times a day (BID) | ORAL | 5 refills | Status: AC
  Filled 2021-08-13: qty 60, 30d supply, fill #0
  Filled 2021-09-23: qty 60, 30d supply, fill #1

## 2021-08-13 NOTE — Addendum Note (Signed)
Addended by: Karle Plumber B on: 08/13/2021 05:49 PM   Modules accepted: Orders

## 2021-08-13 NOTE — Progress Notes (Signed)
Patient ID: Marco [name], male    DOB: February 11, 1956  MRN: [id number]  CC: Diabetes and Hypertension   Subjective: Marco [name] is a 66 y.o. male who presents for chronic ds management His concerns today include:  HTN, DM 2 with neuropathy, and nephropathy, CKD stage III, obesity, CAD (s/p PCI of mid LAD and balloon angioplasty of diagonal lesion 06/2021)/? cardiac amyloid, chronic combined CHF EF 50-55% in 08/2020, PAF s/p ablation 2017 (Xarelto d/c due to recurrent anemia), OSA on VPAP, on Cosentyx and followed by rheumatology in [facility] for psoriasis/gout/nonspec arthritis, chronic insomnia. On narcotics through [facility] for chronic pain in chest/back/shoulders/neck, perinephritic abscess 12/2019.    CAD/HTN:/? Cardiac amyloid/PAF -no pain but still get SOB sometimes with ambulation. Uses his O2 sometimes but not often. No CP Since last visit, he had cardiac cath 06/11/2021 which revealed 75% mLAD and 75% ostial D2 lesion s/p PCI of mid LAD and ballon angioplasty of diagonal lesion.  Pt on Plavix and ASA -on Plavix, ASA, Coreg, Lipitor, Imdur. -checking BP at home and at cardiac rehab 3x a wk.  Levels good -saw [name] 06/2021 but has not seen [name] in a while -not on anticoag due to hx of anemia with bleeding  DM/.Obesity:  saw [name] 07/23/2021.  Insulin Humalog mix 50/50 pen dec to 80 units in a.m.  Mounjaro dose increased to 5 mg/0.5 ml once a wk.   -last wgh at home was 219 lbs. Doing better with eating habits and eating less Still a lot of pain in feet from DM.  Wants to increase Gabapentin; currently on 300 mg QHS  Psoriasis/chronic gout/inflammatory arthritis? RA:  Arava added to Cosentyx when he last saw rheumatologist 07/25/2021.  GFR at that time 51 and creat 1.51.  Has f/u 11/07/2020  CKD 3:  most recent creat 1.77/GFR 42 (range was 56-42). Lasix dose dec recently by cardiology due to bump in creatine.  Plan for repeat BMP in 2 wks.  Has not seen Dr.  Johnney Ou in a while  C/o "stickiness" of hands x 2 mths.  No initiating factors  Wakes in mornings with a lot of sinus congestion and sinus pain.  Saw ENT Dr. Lucia Gaskins 01/2021.  Noted to have septal deviation to the left.  Surgery discussed but medication management was started with nasal spray.  Pt feels they are not helping any more.  HM:  due for c-scope.  Had to be deferred due to needing to be on DAPT for at least 6 mths post cardiac cath  Patient Active Problem List   Diagnosis Date Noted   Immunodeficiency, unspecified ([place]) 08/13/2021   Abnormal stress test    Cellulitis of finger of left hand 10/20/2020   Nasal congestion 10/20/2020   History of Clostridium difficile infection 10/20/2020   Cough 10/14/2020   Healthcare maintenance 06/15/2020   Perinephric abscess 05/13/2020   Pyohydronephrosis 05/13/2020   Vitamin B12 deficiency 02/19/2020   Elevated LFTs    Pleural effusion    Weakness    Loculated pleural effusion    Complex renal cyst    Urethral stricture 12/30/2019   Nephrolithiasis 12/30/2019   Acute febrile illness 12/30/2019   Sepsis ([place]) 12/27/2019   Severe sepsis ([place]) 41/96/2229   Acute metabolic encephalopathy 79/89/2119   Sepsis due to Gram negative bacteria ([place]) 12/09/2019   Rhinitis 10/16/2019   Gastroesophageal reflux disease without esophagitis 08/31/2018   Muscle spasm 08/31/2018   Cardiac amyloidosis (HCC)    Hypokalemia  Acute renal failure superimposed on stage 3 chronic kidney disease ([place]) 08/27/2017   Hyperlipidemia 12/23/2016   Chronic combined systolic and diastolic CHF (congestive heart failure) ([place]) 09/09/2016   Diabetic polyneuropathy associated with type 2 diabetes mellitus ([place]) 06/10/2016   Vision changes 06/02/2016   Myalgia and myositis 03/31/2016   Shortness of breath 02/09/2016   Chronic gouty arthritis 11/29/2015   LBP (low back pain) 11/27/2015   Arthralgia of multiple joints 11/27/2015   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[place])  11/27/2015   Chest pain of uncertain etiology 82/99/3716   Sinusitis, chronic 10/16/2015   Chronic insomnia 10/16/2015   New onset of headaches after age 75 09/20/2015   OSA (obstructive sleep apnea) 07/12/2015   Low serum testosterone level 05/18/2015   Depression 05/16/2015   Morbid obesity due to excess calories (HCC)    Chronic pain    Type 2 diabetes mellitus with complication, with long-term current use of insulin (HCC)    Rheumatoid arthritis ([place]) 02/05/2015   Elevated troponin 12/24/2014   CAD (coronary artery disease) 12/24/2014   Atrial fibrillation ([place]) 12/24/2014   Hypertension 12/24/2014   Chronic kidney disease 12/24/2014   PUD (peptic ulcer disease) 12/24/2014     Current Outpatient Medications on File Prior to Visit  Medication Sig Dispense Refill   Accu-Chek Softclix Lancets lancets USE AS INSTRUCTED 100 each 12   acetaminophen (TYLENOL) 650 MG CR tablet Take 1,300 mg by mouth in the morning and at bedtime.     acitretin (SORIATANE) 25 MG capsule Take 2 capsules by mouth daily. 62 capsule 0   albuterol (VENTOLIN HFA) 108 (90 Base) MCG/ACT inhaler INHALE 2 PUFFS INTO THE LUNGS EVERY 6 HOURS AS NEEDED FOR WHEEZING OR SHORTNESS OF BREATH. 8.5 g 1   allopurinol (ZYLOPRIM) 100 MG tablet Take 2 tablets by mouth daily. 60 tablet 0   amLODipine (NORVASC) 5 MG tablet Take 1 tablet (5 mg total) by mouth daily. (Patient taking differently: Take 5 mg by mouth every evening.) 90 tablet 1   aspirin (EQ ASPIRIN ADULT LOW DOSE) 81 MG EC tablet Take 1 tablet (81 mg total) by mouth daily. TAKE 1 TABLET BY MOUTH ONCE DAILY. SWALLOW WHOLE 30 tablet 12   atorvastatin (LIPITOR) 40 MG tablet Take 1 tablet (40 mg total) by mouth daily. 90 tablet 3   carvedilol (COREG) 12.5 MG tablet TAKE 1 TABLET BY MOUTH TWO TIMES DAILY WITH MEALS 180 tablet 1   clopidogrel (PLAVIX) 75 MG tablet Take 1 tablet (75 mg total) by mouth daily. 30 tablet 5   Continuous Blood Gluc Receiver (DEXCOM G6 RECEIVER)  DEVI Use as instructed to monitor blood sugar daily 1 each 0   Continuous Blood Gluc Sensor (DEXCOM G6 SENSOR) MISC Use as instructed to monitor blood sugar daily. Change every 10 days. 9 each 3   Continuous Blood Gluc Transmit (DEXCOM G6 TRANSMITTER) MISC Use as instructed to monitor blood sugar daily. Change every 90 days. 1 each 3   Crisaborole (EUCRISA) 2 % OINT Apply to affected area twice a day 60 g 0   diclofenac Sodium (VOLTAREN) 1 % GEL Apply 2 g topically 4 times daily as needed. 100 g 1   DULoxetine (CYMBALTA) 20 MG capsule TAKE 1 CAPSULE BY MOUTH ONCE A DAY 90 capsule 1   famotidine (PEPCID) 20 MG tablet TAKE 1 TABLET BY MOUTH 2 TIMES DAILY 60 tablet 2   fexofenadine (ALLEGRA) 180 MG tablet Take 180 mg by mouth every evening.     fluticasone (FLONASE)  50 MCG/ACT nasal spray PLACE 1 SPRAY INTO BOTH NOSTRILS DAILY AS NEEDED FOR ALLERGIES OR RHINITIS. 16 g 1   furosemide (LASIX) 40 MG tablet Take 1 tablet  by mouth 2 times daily. 60 tablet 11   glucose blood test strip USE TO TEST 4 TIMES DAILY. 400 strip 3   Insulin Lispro Prot & Lispro (HUMALOG MIX 50/50 KWIKPEN) (50-50) 100 UNIT/ML Kwikpen Inject 80 Units into the skin daily with breakfast. 90 mL 3   Insulin Pen Needle (UNIFINE PENTIPS) 32G X 4 MM MISC USE AS DIRECTED DAILY 100 each 0   isosorbide mononitrate (IMDUR) 60 MG 24 hr tablet Take 1.5 tablets (90 mg total) by mouth daily. 135 tablet 3   leflunomide (ARAVA) 10 MG tablet Take 1 tablet by mouth daily. 30 tablet 5   Melatonin 10 MG TABS Take 10 mg by mouth at bedtime.     naloxone (NARCAN) nasal spray 4 mg/0.1 mL Place in nostril in the event of suspected overdose on Narcotic medication (Oxycodone) 1 each 0   nitroGLYCERIN (NITROSTAT) 0.4 MG SL tablet Place 1 tablet (0.4 mg total) under the tongue every 5 (five) minutes as needed for chest pain. 25 tablet 2   oxyCODONE-acetaminophen (PERCOCET/ROXICET) 5-325 MG tablet Take 1 tablet by mouth 4 (four) times daily.     oxymetazoline  (AFRIN) 0.05 % nasal spray Place 1 spray into both nostrils 2 times daily. 30 mL 0   potassium chloride SA (KLOR-CON M) 20 MEQ tablet Take 1 tablet by mouth daily. 30 tablet 11   Secukinumab (COSENTYX SENSOREADY PEN) 150 MG/ML SOAJ INJECT ONE PEN SUBCUTANEOUSLY EVERY 4 WEEKS. REFRIGERATE. ALLOW 15 TO 30 MINUTES AT ROOM TEMP PRIOR TO ADMINISTRATION. 3 mL 1   Tafamidis 61 MG CAPS TAKE 1 CAPSULE (61 MG) BY MOUTH DAILY. 30 capsule 11   tamsulosin (FLOMAX) 0.4 MG CAPS capsule Take 1 capsule (0.4 mg total) by mouth at bedtime. 90 capsule 0   tirzepatide (MOUNJARO) 5 MG/0.5ML Pen Inject 5 mg into the skin once a week. 6 mL 3   triamcinolone cream (KENALOG) 0.1 % APPLY TO THE AFFECTED AREA(S) TOPICALLY 2 TIMES DAILY AS DIRECTED 454 g 0   UNIFINE PENTIPS 32G X 4 MM MISC USE AS DIRECTED DAILY 100 each 0   [DISCONTINUED] hydrALAZINE (APRESOLINE) 25 MG tablet Take 1 tablet (25 mg total) by mouth 3 (three) times daily. 90 tablet 3   [DISCONTINUED] metoprolol tartrate (LOPRESSOR) 100 MG tablet Take 1 tablet (100 mg total) by mouth once for 1 dose. 1 tablet 0   [DISCONTINUED] ramelteon (ROZEREM) 8 MG tablet TAKE 1 TABLET BY MOUTH AT BEDTIME 30 tablet 1   No current facility-administered medications on file prior to visit.    Allergies  Allergen Reactions   Nsaids Other (See Comments)    Stomach pains. Ulcers     Jardiance [Empagliflozin] Nausea And Vomiting    Social History   Socioeconomic History   Marital status: Married    Spouse name: Amethyst   Number of children: 8   Years of education: 13   Highest education level: Some college, no degree  Occupational History    Comment: disabled  Tobacco Use   Smoking status: Former    Packs/day: 0.50    Years: 10.00    Pack years: 5.00    Types: Cigarettes    Quit date: 04/11/2008    Years since quitting: 13.3   Smokeless tobacco: Never  Vaping Use   Vaping Use: Never used  Substance and Sexual Activity   Alcohol use: No    Alcohol/week: 0.0  standard drinks   Drug use: No   Sexual activity: Yes    Partners: Female    Birth control/protection: None  Other Topics Concern   Not on file  Social History Narrative   Lives alone Does not work.  On disability.    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 Provided crisis assistance program information to assist with bills       Has issues getting food- gets $36/month in food stamps- already has list of food pantries but has not tried them- encouraged pt to try food pantries and reach out to clinic for help if needed   Social Determinants of Health   Financial Resource Strain: Low Risk    Difficulty of Paying Living Expenses: Not hard at all  Food Insecurity: No Food Insecurity   Worried About Charity fundraiser in the Last Year: Never true   [place] in the Last Year: Never true  Transportation Needs: No Transportation Needs   Lack of Transportation (Medical): No   Lack of Transportation (Non-Medical): No  Physical Activity: Inactive   Days of Exercise per Week: 0 days   Minutes of Exercise per Session: 0 min  Stress: No Stress Concern Present   Feeling of Stress : Not at all  Social Connections: Moderately Isolated   Frequency of Communication with Friends and Family: Once a week   Frequency of Social Gatherings with Friends and Family: Never   Attends Religious Services: More than 4 times per year   Active Member of Clubs or Organizations: Yes   Attends Music therapist: More than 4 times per year   Marital Status: Separated  Intimate Partner Violence: Not At Risk   Fear of Current or Ex-Partner: No   Emotionally Abused: No   Physically Abused: No   Sexually Abused: No    Family History  Problem Relation Age of Onset   Hypertension Mother    Diabetes Mother    COPD  Mother    Lung cancer Mother        Smoker    Hypertension Father    Diabetes Father    Heart Problems Father    COPD Father    Colon cancer Neg Hx    Stomach cancer Neg Hx     Past Surgical History:  Procedure Laterality Date   CARDIAC CATHETERIZATION  05/2014   ablation for atrial fibrillation   CARDIAC CATHETERIZATION N/A 11/16/2015   Procedure: Left Heart Cath and Coronary Angiography;  Surgeon: Leonie Man, MD;  Location: [facility];  Service: Cardiovascular;  Laterality: N/A;   COLON RESECTION  09/2013   due to large, abnormal polpy. non cancerous per patient.    COLON SURGERY  09/2014   colon resection    CORONARY STENT INTERVENTION N/A 06/11/2021   Procedure: CORONARY STENT INTERVENTION;  Surgeon: Jettie Booze, MD;  Location: [facility];  Service: Cardiovascular;  Laterality: N/A;   EXTRACORPOREAL SHOCK WAVE LITHOTRIPSY Left 01/03/2021   Procedure: EXTRACORPOREAL SHOCK WAVE LITHOTRIPSY (ESWL);  Surgeon: Robley Fries, MD;  Location: [facility];  Service: Urology;  Laterality: Left;   IR PERC PLEURAL DRAIN W/INDWELL CATH  W/IMG GUIDE  01/07/2020   LEFT HEART CATH AND CORONARY ANGIOGRAPHY N/A 05/19/2018   Procedure: LEFT HEART CATH AND CORONARY ANGIOGRAPHY;  Surgeon: Wellington Hampshire, MD;  Location: [facility];  Service: Cardiovascular;  Laterality: N/A;   LEFT HEART CATH AND CORONARY ANGIOGRAPHY N/A 06/11/2021   Procedure: LEFT HEART CATH AND CORONARY ANGIOGRAPHY;  Surgeon: Jettie Booze, MD;  Location: [facility];  Service: Cardiovascular;  Laterality: N/A;    ROS: Review of Systems Negative except as stated above  PHYSICAL EXAM: BP 137/78    Pulse 79    Resp 16    Wt 222 lb 3.2 oz (100.8 kg)    SpO2 95%    BMI 36.69 kg/m   Wt Readings from Last 3 Encounters:  08/13/21 222 lb 3.2 oz (100.8 kg)  08/01/21 221 lb 1.9 oz (100.3 kg)  07/23/21 222 lb 9.6 oz (101 kg)    Physical Exam   General appearance -  alert, well appearing, and in no distress Mental status - normal mood, behavior, speech, dress, motor activity, and thought processes Neck - supple, no significant adenopathy Chest - clear to auscultation, no wheezes, rales or rhonchi, symmetric air entry Heart - normal rate, regular rhythm, normal S1, S2, no murmurs, rubs, clicks or gallops Extremities - peripheral pulses normal, no pedal edema, no clubbing or cyanosis Hands: mild swelling of all fingers.  Some stickiness to skin  Lab Results  Component Value Date   HGBA1C 7.8 (A) 07/23/2021    CMP Latest Ref Rng & Units 08/07/2021 06/26/2021 06/11/2021  Glucose 70 - 99 mg/dL 175(H) 171(H) 133(H)  BUN 8 - 27 mg/dL 16 15 13   Creatinine 0.76 - 1.27 mg/dL 1.77(H) 1.63(H) 1.65(H)  Sodium 134 - 144 mmol/L 141 143 138  Potassium 3.5 - 5.2 mmol/L 4.1 4.0 3.8  Chloride 96 - 106 mmol/L 102 101 103  CO2 20 - 29 mmol/L 25 26 26   Calcium 8.6 - 10.2 mg/dL 9.0 9.2 8.5(L)  Total Protein 6.0 - 8.5 g/dL - - -  Total Bilirubin 0.0 - 1.2 mg/dL - - -  Alkaline Phos 44 - 121 IU/L - - -  AST 0 - 40 IU/L - - -  ALT 0 - 44 IU/L - - -   Lipid Panel     Component Value Date/Time   CHOL 97 (L) 01/11/2021 1104   TRIG 172 (H) 01/11/2021 1104   HDL 27 (L) 01/11/2021 1104   CHOLHDL 3.6 01/11/2021 1104   CHOLHDL 2.6 06/10/2016 1253   VLDL 18 06/10/2016 1253   LDLCALC 41 01/11/2021 1104    CBC    Component Value Date/Time   WBC 7.9 05/31/2021 1544   WBC 10.1 08/21/2020 1118   RBC 4.01 (L) 05/31/2021 1544   RBC 4.24 08/21/2020 1118   HGB 12.4 (L) 05/31/2021 1544   HCT 36.1 (L) 05/31/2021 1544   PLT 257 05/31/2021 1544   MCV 90 05/31/2021 1544   MCH 30.9 05/31/2021 1544   MCH 31.6 08/21/2020 1118   MCHC 34.3 05/31/2021 1544   MCHC 33.5 08/21/2020 1118   RDW 12.3 05/31/2021 1544   LYMPHSABS 1.9 06/11/2020 1145   MONOABS 2.3 (H) 05/18/2020 0304   EOSABS 0.3 06/11/2020 1145   BASOSABS 0.1 06/11/2020 1145    ASSESSMENT AND PLAN: 1. Type 2  diabetes mellitus with polyneuropathy ([place]) Encouraged him to continue healthy eating habits.  He will continue insulin GLIP 1 as prescribed by endocrinology Increase Gabapentin to 300  mg BID - gabapentin (NEURONTIN) 300 MG capsule; Take 1 capsule (300 mg total) by mouth 2 (two) times daily.  Dispense: 60 capsule; Refill: 5  2. Hypertension associated with diabetes ([place]) Close to goal.  Blood pressure has been good at cardiac rehab.  No changes made in medicines.  3. Morbid (severe) obesity due to excess calories ([place]) See #1 above.  4. Cardiac amyloidosis ([place]) Followed by cardiology.  We will get him back in with Dr. Ronna Polio.  5. Paroxysmal atrial fibrillation (HCC) In sinus rhythm at this time.  Not a candidate for anticoagulation  6. Stage 3b chronic kidney disease ([place]) Continue to monitor kidney function.  We will get him back in for follow-up with Dr. Johnney Ou - Ambulatory referral to Nephrology  7. Chronic congestion of paranasal sinus - Ambulatory referral to ENT  8. Chronic inflammatory arthritis Followed by rheumatology.  We will need to keep an eye on his LFTs and kidney function with [facility]. Not sure what is causing the sticky feeling to hands.  Will observe for now.  9. Need for Streptococcus pneumoniae vaccination - PNEUMOCOCCAL CONJUGATE VACCINE 15-VALENT     Patient was given the opportunity to ask questions.  Patient verbalized understanding of the plan and was able to repeat key elements of the plan.   Orders Placed This Encounter  Procedures   PNEUMOCOCCAL CONJUGATE VACCINE 15-VALENT   Ambulatory referral to Nephrology   Ambulatory referral to ENT     Requested Prescriptions   Signed Prescriptions Disp Refills   gabapentin (NEURONTIN) 300 MG capsule 60 capsule 5    Sig: Take 1 capsule (300 mg total) by mouth 2 (two) times daily.    Return in about 4 months (around 12/11/2021).  Karle Plumber, MD, FACP

## 2021-08-14 ENCOUNTER — Encounter (HOSPITAL_COMMUNITY)
Admission: RE | Admit: 2021-08-14 | Discharge: 2021-08-14 | Disposition: A | Discharge status: Home / Self Care | Payer: Medicare Other | Source: Ambulatory Visit | Attending: Cardiology | Admitting: Cardiology

## 2021-08-14 DIAGNOSIS — E119 Type 2 diabetes mellitus without complications: Secondary | ICD-10-CM | POA: Diagnosis not present

## 2021-08-14 DIAGNOSIS — Z5189 Encounter for other specified aftercare: Secondary | ICD-10-CM | POA: Insufficient documentation

## 2021-08-14 DIAGNOSIS — Z955 Presence of coronary angioplasty implant and graft: Secondary | ICD-10-CM | POA: Diagnosis not present

## 2021-08-14 LAB — GLUCOSE, CAPILLARY
Glucose-Capillary: 103 mg/dL — ABNORMAL HIGH (ref 70–99)
Glucose-Capillary: 116 mg/dL — ABNORMAL HIGH (ref 70–99)

## 2021-08-15 DIAGNOSIS — J9601 Acute respiratory failure with hypoxia: Secondary | ICD-10-CM | POA: Diagnosis not present

## 2021-08-16 ENCOUNTER — Other Ambulatory Visit: Payer: Self-pay

## 2021-08-16 ENCOUNTER — Encounter (HOSPITAL_COMMUNITY)
Admission: RE | Admit: 2021-08-16 | Discharge: 2021-08-16 | Disposition: A | Discharge status: Home / Self Care | Payer: Medicare Other | Source: Ambulatory Visit | Attending: Cardiology | Admitting: Cardiology

## 2021-08-16 DIAGNOSIS — Z955 Presence of coronary angioplasty implant and graft: Secondary | ICD-10-CM

## 2021-08-16 NOTE — Progress Notes (Signed)
Incomplete Session Note  Patient Details  Name: Marco [name] MRN: [id number] Date of Birth: 1956/04/23 Referring Provider:   Flowsheet Row CARDIAC REHAB PHASE II ORIENTATION from 08/01/2021 in [facility]  Referring Provider Dr. [name], MD       [name] did not complete his rehab session.  [name] reported to cardiac rehab with a cough and wore slides. No exercise today due to no open toe shoe policy. Patient said his cough is due to chronic congestion. [name] plans to return to exercise on Monday providing he is fee of symptoms. Patient states understanding.Harrell Gave RN BSN

## 2021-08-19 ENCOUNTER — Encounter (HOSPITAL_COMMUNITY): Payer: Medicare Other

## 2021-08-20 ENCOUNTER — Other Ambulatory Visit (HOSPITAL_COMMUNITY): Payer: Self-pay

## 2021-08-20 ENCOUNTER — Telehealth (HOSPITAL_COMMUNITY): Payer: Self-pay | Admitting: Pharmacist

## 2021-08-20 DIAGNOSIS — E119 Type 2 diabetes mellitus without complications: Secondary | ICD-10-CM | POA: Diagnosis not present

## 2021-08-20 DIAGNOSIS — Z794 Long term (current) use of insulin: Secondary | ICD-10-CM | POA: Diagnosis not present

## 2021-08-20 NOTE — Telephone Encounter (Signed)
Attempted to submit prior authorization for Vyndamax on CMM. Received the following message: This medication or product was previously approved on [id number] from 2021-08-11 to 2022-08-10.   Audry Riles, PharmD, BCPS, BCCP, CPP [facility] Pharmacist [phone]

## 2021-08-21 ENCOUNTER — Encounter (HOSPITAL_COMMUNITY): Payer: Medicare Other

## 2021-08-21 ENCOUNTER — Other Ambulatory Visit: Payer: Medicare Other

## 2021-08-21 ENCOUNTER — Telehealth (HOSPITAL_COMMUNITY): Payer: Self-pay | Admitting: Internal Medicine

## 2021-08-21 ENCOUNTER — Other Ambulatory Visit (HOSPITAL_COMMUNITY): Payer: Medicare Other

## 2021-08-21 ENCOUNTER — Other Ambulatory Visit (HOSPITAL_COMMUNITY): Payer: Self-pay

## 2021-08-23 ENCOUNTER — Other Ambulatory Visit (HOSPITAL_COMMUNITY): Payer: Self-pay

## 2021-08-23 ENCOUNTER — Encounter (HOSPITAL_COMMUNITY): Payer: Medicare Other

## 2021-08-23 ENCOUNTER — Telehealth (HOSPITAL_COMMUNITY): Payer: Self-pay | Admitting: *Deleted

## 2021-08-23 NOTE — Telephone Encounter (Signed)
Pt called out today for cardiac rehab.  Pt still feeling sick.  Hopes to return on Monday. Cherre Huger, BSN Cardiac and Training and development officer

## 2021-08-24 ENCOUNTER — Other Ambulatory Visit (HOSPITAL_COMMUNITY): Payer: Self-pay

## 2021-08-26 ENCOUNTER — Encounter (HOSPITAL_COMMUNITY)
Admission: RE | Admit: 2021-08-26 | Discharge: 2021-08-26 | Disposition: A | Discharge status: Home / Self Care | Payer: Medicare Other | Source: Ambulatory Visit | Attending: Cardiology | Admitting: Cardiology

## 2021-08-26 ENCOUNTER — Other Ambulatory Visit (HOSPITAL_COMMUNITY): Payer: Self-pay

## 2021-08-26 ENCOUNTER — Other Ambulatory Visit: Payer: Self-pay

## 2021-08-26 DIAGNOSIS — Z955 Presence of coronary angioplasty implant and graft: Secondary | ICD-10-CM | POA: Diagnosis not present

## 2021-08-26 DIAGNOSIS — E119 Type 2 diabetes mellitus without complications: Secondary | ICD-10-CM | POA: Diagnosis not present

## 2021-08-26 DIAGNOSIS — Z5189 Encounter for other specified aftercare: Secondary | ICD-10-CM | POA: Diagnosis not present

## 2021-08-27 ENCOUNTER — Ambulatory Visit (HOSPITAL_COMMUNITY)
Admission: RE | Admit: 2021-08-27 | Discharge: 2021-08-27 | Disposition: A | Discharge status: Home / Self Care | Payer: Medicare Other | Source: Ambulatory Visit | Attending: Cardiology | Admitting: Cardiology

## 2021-08-27 DIAGNOSIS — N1831 Chronic kidney disease, stage 3a: Secondary | ICD-10-CM | POA: Insufficient documentation

## 2021-08-27 DIAGNOSIS — I2583 Coronary atherosclerosis due to lipid rich plaque: Secondary | ICD-10-CM | POA: Insufficient documentation

## 2021-08-27 DIAGNOSIS — E1122 Type 2 diabetes mellitus with diabetic chronic kidney disease: Secondary | ICD-10-CM | POA: Insufficient documentation

## 2021-08-27 DIAGNOSIS — I428 Other cardiomyopathies: Secondary | ICD-10-CM | POA: Insufficient documentation

## 2021-08-27 DIAGNOSIS — G4733 Obstructive sleep apnea (adult) (pediatric): Secondary | ICD-10-CM | POA: Diagnosis not present

## 2021-08-27 DIAGNOSIS — I420 Dilated cardiomyopathy: Secondary | ICD-10-CM | POA: Insufficient documentation

## 2021-08-27 DIAGNOSIS — Z794 Long term (current) use of insulin: Secondary | ICD-10-CM | POA: Diagnosis not present

## 2021-08-27 DIAGNOSIS — I480 Paroxysmal atrial fibrillation: Secondary | ICD-10-CM | POA: Insufficient documentation

## 2021-08-27 DIAGNOSIS — E7800 Pure hypercholesterolemia, unspecified: Secondary | ICD-10-CM | POA: Diagnosis not present

## 2021-08-27 DIAGNOSIS — I129 Hypertensive chronic kidney disease with stage 1 through stage 4 chronic kidney disease, or unspecified chronic kidney disease: Secondary | ICD-10-CM | POA: Diagnosis not present

## 2021-08-27 DIAGNOSIS — I10 Essential (primary) hypertension: Secondary | ICD-10-CM

## 2021-08-27 DIAGNOSIS — I2510 Atherosclerotic heart disease of native coronary artery without angina pectoris: Secondary | ICD-10-CM | POA: Diagnosis not present

## 2021-08-27 DIAGNOSIS — E118 Type 2 diabetes mellitus with unspecified complications: Secondary | ICD-10-CM

## 2021-08-27 LAB — ECHOCARDIOGRAM COMPLETE
Area-P 1/2: 3.5 cm2
Calc EF: 42.8 %
S' Lateral: 4.05 cm
Single Plane A2C EF: 42.4 %
Single Plane A4C EF: 45.6 %

## 2021-08-27 NOTE — Progress Notes (Signed)
Cardiac Individual Treatment Plan  Patient Details  Name: Marco [name] MRN: [id number] Date of Birth: [birth year] Referring Provider:   Flowsheet Row CARDIAC REHAB PHASE II ORIENTATION from 08/01/2021 in [facility]  Referring Provider Dr. [name], MD       Initial Encounter Date:  [place] from 08/01/2021 in [facility]  Date 08/01/21       Visit Diagnosis: 06/11/21 S/P DES, PTCA LAD  Patient's Home Medications on Admission:  Current Outpatient Medications:    Accu-Chek Softclix Lancets lancets, USE AS INSTRUCTED, Disp: 100 each, Rfl: 12   acetaminophen (TYLENOL) 650 MG CR tablet, Take 1,300 mg by mouth in the morning and at bedtime., Disp: , Rfl:    acitretin (SORIATANE) 25 MG capsule, Take 2 capsules by mouth daily., Disp: 62 capsule, Rfl: 0   albuterol (VENTOLIN HFA) 108 (90 Base) MCG/ACT inhaler, INHALE 2 PUFFS INTO THE LUNGS EVERY 6 HOURS AS NEEDED FOR WHEEZING OR SHORTNESS OF BREATH., Disp: 8.5 g, Rfl: 1   allopurinol (ZYLOPRIM) 100 MG tablet, Take 2 tablets by mouth daily., Disp: 60 tablet, Rfl: 0   amLODipine (NORVASC) 5 MG tablet, Take 1 tablet (5 mg total) by mouth daily. (Patient taking differently: Take 5 mg by mouth every evening.), Disp: 90 tablet, Rfl: 1   aspirin (EQ ASPIRIN ADULT LOW DOSE) 81 MG EC tablet, Take 1 tablet (81 mg total) by mouth daily. TAKE 1 TABLET BY MOUTH ONCE DAILY. SWALLOW WHOLE, Disp: 30 tablet, Rfl: 12   atorvastatin (LIPITOR) 40 MG tablet, Take 1 tablet (40 mg total) by mouth daily., Disp: 90 tablet, Rfl: 3   carvedilol (COREG) 12.5 MG tablet, TAKE 1 TABLET BY MOUTH TWO TIMES DAILY WITH MEALS, Disp: 180 tablet, Rfl: 1   clopidogrel (PLAVIX) 75 MG tablet, Take 1 tablet (75 mg total) by mouth daily., Disp: 30 tablet, Rfl: 5   Continuous Blood Gluc Receiver (DEXCOM G6 RECEIVER) DEVI, Use as instructed to monitor blood sugar daily, Disp: 1  each, Rfl: 0   Continuous Blood Gluc Sensor (DEXCOM G6 SENSOR) MISC, Use as instructed to monitor blood sugar daily. Change every 10 days., Disp: 9 each, Rfl: 3   Continuous Blood Gluc Transmit (DEXCOM G6 TRANSMITTER) MISC, Use as instructed to monitor blood sugar daily. Change every 90 days., Disp: 1 each, Rfl: 3   Crisaborole (EUCRISA) 2 % OINT, Apply to affected area twice a day, Disp: 60 g, Rfl: 0   diclofenac Sodium (VOLTAREN) 1 % GEL, Apply 2 g topically 4 times daily as needed., Disp: 100 g, Rfl: 1   DULoxetine (CYMBALTA) 20 MG capsule, TAKE 1 CAPSULE BY MOUTH ONCE A DAY, Disp: 90 capsule, Rfl: 1   famotidine (PEPCID) 20 MG tablet, TAKE 1 TABLET BY MOUTH 2 TIMES DAILY, Disp: 60 tablet, Rfl: 2   fexofenadine (ALLEGRA) 180 MG tablet, Take 180 mg by mouth every evening., Disp: , Rfl:    fluticasone (FLONASE) 50 MCG/ACT nasal spray, PLACE 1 SPRAY INTO BOTH NOSTRILS DAILY AS NEEDED FOR ALLERGIES OR RHINITIS., Disp: 16 g, Rfl: 1   furosemide (LASIX) 40 MG tablet, Take 1 tablet  by mouth 2 times daily., Disp: 60 tablet, Rfl: 11   gabapentin (NEURONTIN) 300 MG capsule, Take 1 capsule (300 mg total) by mouth 2 (two) times daily., Disp: 60 capsule, Rfl: 5   glucose blood test strip, USE TO TEST 4 TIMES DAILY., Disp: 400 strip, Rfl: 3  Insulin Lispro Prot & Lispro (HUMALOG MIX 50/50 KWIKPEN) (50-50) 100 UNIT/ML Kwikpen, Inject 80 Units into the skin daily with breakfast., Disp: 90 mL, Rfl: 3   Insulin Pen Needle (UNIFINE PENTIPS) 32G X 4 MM MISC, USE AS DIRECTED DAILY, Disp: 100 each, Rfl: 0   isosorbide mononitrate (IMDUR) 60 MG 24 hr tablet, Take 1.5 tablets (90 mg total) by mouth daily., Disp: 135 tablet, Rfl: 3   leflunomide (ARAVA) 10 MG tablet, Take 1 tablet by mouth daily., Disp: 30 tablet, Rfl: 5   Melatonin 10 MG TABS, Take 10 mg by mouth at bedtime., Disp: , Rfl:    naloxone (NARCAN) nasal spray 4 mg/0.1 mL, Place in nostril in the event of suspected overdose on Narcotic medication  (Oxycodone), Disp: 1 each, Rfl: 0   nitroGLYCERIN (NITROSTAT) 0.4 MG SL tablet, Place 1 tablet (0.4 mg total) under the tongue every 5 (five) minutes as needed for chest pain., Disp: 25 tablet, Rfl: 2   oxyCODONE-acetaminophen (PERCOCET/ROXICET) 5-325 MG tablet, Take 1 tablet by mouth 4 (four) times daily., Disp: , Rfl:    oxymetazoline (AFRIN) 0.05 % nasal spray, Place 1 spray into both nostrils 2 times daily., Disp: 30 mL, Rfl: 0   potassium chloride SA (KLOR-CON M) 20 MEQ tablet, Take 1 tablet by mouth daily., Disp: 30 tablet, Rfl: 11   Secukinumab (COSENTYX SENSOREADY PEN) 150 MG/ML SOAJ, INJECT ONE PEN SUBCUTANEOUSLY EVERY 4 WEEKS. REFRIGERATE. ALLOW 15 TO 30 MINUTES AT ROOM TEMP PRIOR TO ADMINISTRATION., Disp: 3 mL, Rfl: 1   Tafamidis 61 MG CAPS, TAKE 1 CAPSULE (61 MG) BY MOUTH DAILY., Disp: 30 capsule, Rfl: 11   tamsulosin (FLOMAX) 0.4 MG CAPS capsule, Take 1 capsule (0.4 mg total) by mouth at bedtime., Disp: 90 capsule, Rfl: 0   tirzepatide (MOUNJARO) 5 MG/0.5ML Pen, Inject 5 mg into the skin once a week., Disp: 6 mL, Rfl: 3   triamcinolone cream (KENALOG) 0.1 %, APPLY TO THE AFFECTED AREA(S) TOPICALLY 2 TIMES DAILY AS DIRECTED, Disp: 454 g, Rfl: 0   UNIFINE PENTIPS 32G X 4 MM MISC, USE AS DIRECTED DAILY, Disp: 100 each, Rfl: 0  Past Medical History: Past Medical History:  Diagnosis Date   Benign colon polyp 08/01/2013   [facility] in [place]. large base tranverse colon polyp was biopsied. polyp was benign with minimal surface hyperplastic change.   Chronic combined systolic and diastolic CHF (congestive heart failure) (HCC)    Chronic pain    CKD (chronic kidney disease), stage III (HCC)    Diabetes mellitus without complication ([place])    Diverticulosis    Esophageal hiatal hernia 07/29/2013   confirmed on EGD    Esophageal stricture    Essential hypertension    Gastritis 07/29/2013   confirmed on EGD, bx done an negative for intestinal metaplasia, dsyplasia or H.  pylori. normal gastric emptying study done 07/13/2013.   GERD (gastroesophageal reflux disease)    Morbid obesity (HCC)    Non-obstructive CAD    Coronary CTA 04/2021 showed coronary calcium score of 535 which is 93rd % for age and sex matched controls.  There is mild CAD with mild calcified plaque in the distal LAD 25-49% and also in the ostial D3.  There is minimal calcified plaque in the mid LCx and mild mixed plaque in the pRCA with 25-49% stenosis.   Persistent atrial fibrillation ([place])    a. 02/2013 s/p rfca in [place], [place]-->prev on Xarelto, d/c'd 2/2 anemia, ? GIB.   Rheumatoid  arthritis ([place])    Sigmoid diverticulosis 08/01/2013   confirmed on colonscopy. record scanned into chart    Tobacco Use: Social History   Tobacco Use  Smoking Status Former   Packs/day: 0.50   Years: 10.00   Pack years: 5.00   Types: Cigarettes   Quit date: 04/11/2008   Years since quitting: 13.3  Smokeless Tobacco Never    Labs: Recent Review Flowsheet Data     Labs for ITP Cardiac and Pulmonary Rehab Latest Ref Rng & Units 10/17/2020 12/28/2020 01/11/2021 04/16/2021 07/23/2021   Cholestrol 100 - 199 mg/dL - - 97(L) - -   LDLCALC 0 - 99 mg/dL - - 41 - -   HDL >39 mg/dL - - 27(L) - -   Trlycerides 0 - 149 mg/dL - - 172(H) - -   Hemoglobin A1c 4.0 - 5.6 % 8.8(A) 7.9(A) - 9.1(A) 7.8(A)   PHART 7.350 - 7.450 - - - - -   PCO2ART 32.0 - 48.0 mmHg - - - - -   HCO3 20.0 - 28.0 mmol/L - - - - -   TCO2 22 - 32 mmol/L - - - - -   ACIDBASEDEF 0.0 - 2.0 mmol/L - - - - -   O2SAT % - - - - -       Capillary Blood Glucose: Lab Results  Component Value Date   GLUCAP 116 (H) 08/14/2021   GLUCAP 103 (H) 08/14/2021   GLUCAP 154 (H) 08/07/2021   GLUCAP 143 (H) 08/07/2021   GLUCAP 124 (H) 06/11/2021     Exercise Target Goals: Exercise Program Goal: Individual exercise prescription set using results from initial 6 min walk test and THRR while considering  patients activity barriers and safety.   Exercise  Prescription Goal: Starting with aerobic activity 30 plus minutes a day, 3 days per week for initial exercise prescription. Provide home exercise prescription and guidelines that participant acknowledges understanding prior to discharge.  Activity Barriers & Risk Stratification:  Activity Barriers & Cardiac Risk Stratification - 08/01/21 1441       Activity Barriers & Cardiac Risk Stratification   Activity Barriers Neck/Spine Problems;Arthritis;Back Problems;Joint Problems;Balance Concerns;Deconditioning;History of Falls;Assistive Device    Cardiac Risk Stratification High             6 Minute Walk:  6 Minute Walk     Row Name 08/01/21 1433         6 Minute Walk   Phase Initial     Distance 724 feet     Walk Time 6 minutes     # of Rest Breaks 1  1 rest break from 5:20-5:45     MPH 1.37     METS 1.41     RPE 11     Perceived Dyspnea  1     VO2 Peak 4.93     Symptoms Yes (comment)     Comments 6/10 right leg pain, rest break from 5:20-5:45     Resting HR 82 bpm     Resting BP 124/62     Resting Oxygen Saturation  98 %     Exercise Oxygen Saturation  during 6 min walk 97 %     Max Ex. HR 86 bpm     Max Ex. BP 124/62     2 Minute Post BP 113/69              Oxygen Initial Assessment:   Oxygen Re-Evaluation:   Oxygen Discharge (Final Oxygen Re-Evaluation):   Initial  Exercise Prescription:  Initial Exercise Prescription - 08/01/21 1400       Date of Initial Exercise RX and Referring Provider   Date 08/01/21    Referring Provider Dr. [name], MD    Expected Discharge Date 09/27/20      NuStep   Level 1    SPM 65    Minutes 20    METs 1.5      Prescription Details   Frequency (times per week) 3    Duration Progress to 30 minutes of continuous aerobic without signs/symptoms of physical distress      Intensity   THRR 40-80% of Max Heartrate 62-123    Ratings of Perceived Exertion 11-13    Perceived Dyspnea 0-4      Progression    Progression Continue progressive overload as per policy without signs/symptoms or physical distress.      Resistance Training   Training Prescription Yes    Weight 2lbs    Reps 10-15             Perform Capillary Blood Glucose checks as needed.  Exercise Prescription Changes:   Exercise Prescription Changes     Row Name 08/07/21 1610             Response to Exercise   Blood Pressure (Admit) 122/80       Blood Pressure (Exercise) 114/70       Blood Pressure (Exit) 104/58       Heart Rate (Admit) 78 bpm       Heart Rate (Exercise) 89 bpm       Heart Rate (Exit) 75 bpm       Rating of Perceived Exertion (Exercise) 11       Perceived Dyspnea (Exercise) 0       Symptoms 0       Comments Pt first day in the CRP2 program       Duration Progress to 30 minutes of  aerobic without signs/symptoms of physical distress       Intensity THRR unchanged         Progression   Progression Continue to progress workloads to maintain intensity without signs/symptoms of physical distress.       Average METs 1.3         Resistance Training   Training Prescription No  No weight wednesday         NuStep   Level 1       SPM 65       Minutes 20       METs 1.3                Exercise Comments:   Exercise Comments     Row Name 08/07/21 1614 08/23/21 1637         Exercise Comments Pt first day in the CRP2 program. Pt tolerated 20 minutes of exercise on the Nustep, plus stretches well. Pt is learning his THRR, RPE and exercise Rx. Will continue to monitor pt an progress workloads as tolerated without sign or symptom Pt has been absent due to illness. Will review education and goals when he returns               Exercise Goals and Review:   Exercise Goals     Row Name 08/01/21 1450             Exercise Goals   Increase Physical Activity Yes       Intervention Provide advice, education, support and  counseling about physical activity/exercise needs.;Develop an  individualized exercise prescription for aerobic and resistive training based on initial evaluation findings, risk stratification, comorbidities and participant's personal goals.       Expected Outcomes Short Term: Attend rehab on a regular basis to increase amount of physical activity.;Long Term: Add in home exercise to make exercise part of routine and to increase amount of physical activity.;Long Term: Exercising regularly at least 3-5 days a week.       Increase Strength and Stamina Yes       Intervention Provide advice, education, support and counseling about physical activity/exercise needs.;Develop an individualized exercise prescription for aerobic and resistive training based on initial evaluation findings, risk stratification, comorbidities and participant's personal goals.       Expected Outcomes Short Term: Increase workloads from initial exercise prescription for resistance, speed, and METs.;Short Term: Perform resistance training exercises routinely during rehab and add in resistance training at home;Long Term: Improve cardiorespiratory fitness, muscular endurance and strength as measured by increased METs and functional capacity (6MWT)       Able to understand and use rate of perceived exertion (RPE) scale Yes       Intervention Provide education and explanation on how to use RPE scale       Expected Outcomes Short Term: Able to use RPE daily in rehab to express subjective intensity level;Long Term:  Able to use RPE to guide intensity level when exercising independently       Knowledge and understanding of Target Heart Rate Range (THRR) Yes       Intervention Provide education and explanation of THRR including how the numbers were predicted and where they are located for reference       Expected Outcomes Short Term: Able to state/look up THRR;Long Term: Able to use THRR to govern intensity when exercising independently;Short Term: Able to use daily as guideline for intensity in rehab        Understanding of Exercise Prescription Yes       Intervention Provide education, explanation, and written materials on patient's individual exercise prescription       Expected Outcomes Short Term: Able to explain program exercise prescription;Long Term: Able to explain home exercise prescription to exercise independently                Exercise Goals Re-Evaluation :  Exercise Goals Re-Evaluation     Row Name 08/07/21 1612             Exercise Goal Re-Evaluation   Exercise Goals Review Increase Physical Activity;Increase Strength and Stamina;Able to understand and use rate of perceived exertion (RPE) scale;Knowledge and understanding of Target Heart Rate Range (THRR);Understanding of Exercise Prescription       Comments Pt first day in the CRP2 program. Pt tolerated 20 minutes of exercise well. Pt is learning his THRR, RPE and exercise Rx.       Expected Outcomes Will continue to monitor pt and progress workloads as tolerated without sign or symptom                 Discharge Exercise Prescription (Final Exercise Prescription Changes):  Exercise Prescription Changes - 08/07/21 1610       Response to Exercise   Blood Pressure (Admit) 122/80    Blood Pressure (Exercise) 114/70    Blood Pressure (Exit) 104/58    Heart Rate (Admit) 78 bpm    Heart Rate (Exercise) 89 bpm    Heart Rate (Exit) 75 bpm    Rating  of Perceived Exertion (Exercise) 11    Perceived Dyspnea (Exercise) 0    Symptoms 0    Comments Pt first day in the CRP2 program    Duration Progress to 30 minutes of  aerobic without signs/symptoms of physical distress    Intensity THRR unchanged      Progression   Progression Continue to progress workloads to maintain intensity without signs/symptoms of physical distress.    Average METs 1.3      Resistance Training   Training Prescription No   No weight wednesday     NuStep   Level 1    SPM 65    Minutes 20    METs 1.3             Nutrition:   Target Goals: Understanding of nutrition guidelines, daily intake of sodium 1500mg , cholesterol 200mg , calories 30% from fat and 7% or less from saturated fats, daily to have 5 or more servings of fruits and vegetables.  Biometrics:   Post Biometrics - 08/01/21 1444        Post  Biometrics   Waist Circumference 48 inches    Hip Circumference 45.5 inches    Waist to Hip Ratio 1.05 %    Triceps Skinfold 20 mm    % Body Fat 36.2 %    Grip Strength 28 kg    Flexibility --   Pt unable to reach   Single Leg Stand 2.7 seconds             Nutrition Therapy Plan and Nutrition Goals:   Nutrition Assessments:  MEDIFICTS Score Key: ?70 Need to make dietary changes  40-70 Heart Healthy Diet ? 40 Therapeutic Level Cholesterol Diet   Picture Your Plate Scores: <00 Unhealthy dietary pattern with much room for improvement. 41-50 Dietary pattern unlikely to meet recommendations for good health and room for improvement. 51-60 More healthful dietary pattern, with some room for improvement.  >60 Healthy dietary pattern, although there may be some specific behaviors that could be improved.    Nutrition Goals Re-Evaluation:   Nutrition Goals Discharge (Final Nutrition Goals Re-Evaluation):   Psychosocial: Target Goals: Acknowledge presence or absence of significant depression and/or stress, maximize coping skills, provide positive support system. Participant is able to verbalize types and ability to use techniques and skills needed for reducing stress and depression.  Initial Review & Psychosocial Screening:  Initial Psych Review & Screening - 08/01/21 1341       Initial Review   Current issues with History of Depression      Family Dynamics   Good Support System? Yes   [name] lives alone. [name] has his two children who live in the area for support     Barriers   Psychosocial barriers to participate in program There are no identifiable barriers or psychosocial needs.       Screening Interventions   Interventions Encouraged to exercise             Quality of Life Scores:  Quality of Life - 08/01/21 1455       Quality of Life   Select Quality of Life      Quality of Life Scores   Health/Function Pre 18.37 %    Socioeconomic Pre 25.67 %    Psych/Spiritual Pre 25.71 %    Family Pre 26.4 %    GLOBAL Pre 22.47 %            Scores of 19 and below usually indicate a poorer quality  of life in these areas.  A difference of  2-3 points is a clinically meaningful difference.  A difference of 2-3 points in the total score of the Quality of Life Index has been associated with significant improvement in overall quality of life, self-image, physical symptoms, and general health in studies assessing change in quality of life.  PHQ-9: Recent Review Flowsheet Data     Depression screen [facility] 2/9 08/13/2021 08/01/2021 04/11/2021 04/05/2021 02/19/2021   Decreased Interest 0 0 0 0 0   Down, Depressed, Hopeless 0 0 0 0 0   PHQ - 2 Score 0 0 0 0 0      Interpretation of Total Score  Total Score Depression Severity:  1-4 = Minimal depression, 5-9 = Mild depression, 10-14 = Moderate depression, 15-19 = Moderately severe depression, 20-27 = Severe depression   Psychosocial Evaluation and Intervention:   Psychosocial Re-Evaluation:  Psychosocial Re-Evaluation     [facility] Name 08/08/21 0759 08/27/21 1436           Psychosocial Re-Evaluation   Current issues with History of Depression History of Depression      Comments Reviewed quality of life questionnaire. [name] denies being currently depressed. [name] denies being currently depressed.      Expected Outcomes [name] will have controlled or decreased depression upon completion of phase 2 cardiac rehab. [name] will have controlled or decreased depression upon completion of phase 2 cardiac rehab.      Interventions Encouraged to attend Cardiac Rehabilitation for the exercise;Stress management education;Relaxation education  Encouraged to attend Cardiac Rehabilitation for the exercise;Stress management education;Relaxation education      Continue Psychosocial Services  No Follow up required No Follow up required               Psychosocial Discharge (Final Psychosocial Re-Evaluation):  Psychosocial Re-Evaluation - 08/27/21 1436       Psychosocial Re-Evaluation   Current issues with History of Depression    Comments [name] denies being currently depressed.    Expected Outcomes [name] will have controlled or decreased depression upon completion of phase 2 cardiac rehab.    Interventions Encouraged to attend Cardiac Rehabilitation for the exercise;Stress management education;Relaxation education    Continue Psychosocial Services  No Follow up required             Vocational Rehabilitation: Provide vocational rehab assistance to qualifying candidates.   Vocational Rehab Evaluation & Intervention:  Vocational Rehab - 08/01/21 1346       Initial Vocational Rehab Evaluation & Intervention   Assessment shows need for Vocational Rehabilitation No   [name] is disabled and does not need vocational rehab at this time            Education: Education Goals: Education classes will be provided on a weekly basis, covering required topics. Participant will state understanding/return demonstration of topics presented.  Learning Barriers/Preferences:  Learning Barriers/Preferences - 08/01/21 1452       Learning Barriers/Preferences   Learning Barriers Exercise Concerns   balance concerns   Learning Preferences Audio;Computer/Internet;Group Instruction;Individual Instruction;Pictoral;Skilled Demonstration;Verbal Instruction;Video;Written Material             Education Topics: Hypertension, Hypertension Reduction -Define heart disease and high blood pressure. Discus how high blood pressure affects the body and ways to reduce high blood pressure.   Exercise and Your Heart -Discuss why it is important to  exercise, the FITT principles of exercise, normal and abnormal responses to exercise, and how to exercise safely.   Angina -Discuss definition  of angina, causes of angina, treatment of angina, and how to decrease risk of having angina.   Cardiac Medications -Review what the following cardiac medications are used for, how they affect the body, and side effects that may occur when taking the medications.  Medications include Aspirin, Beta blockers, calcium channel blockers, ACE Inhibitors, angiotensin receptor blockers, diuretics, digoxin, and antihyperlipidemics.   Congestive Heart Failure -Discuss the definition of CHF, how to live with CHF, the signs and symptoms of CHF, and how keep track of weight and sodium intake.   Heart Disease and Intimacy -Discus the effect sexual activity has on the heart, how changes occur during intimacy as we age, and safety during sexual activity.   Smoking Cessation / COPD -Discuss different methods to quit smoking, the health benefits of quitting smoking, and the definition of COPD.   Nutrition I: Fats -Discuss the types of cholesterol, what cholesterol does to the heart, and how cholesterol levels can be controlled.   Nutrition II: Labels -Discuss the different components of food labels and how to read food label   Heart Parts/Heart Disease and PAD -Discuss the anatomy of the heart, the pathway of blood circulation through the heart, and these are affected by heart disease.   Stress I: Signs and Symptoms -Discuss the causes of stress, how stress may lead to anxiety and depression, and ways to limit stress.   Stress II: Relaxation -Discuss different types of relaxation techniques to limit stress.   Warning Signs of Stroke / TIA -Discuss definition of a stroke, what the signs and symptoms are of a stroke, and how to identify when someone is having stroke.   Knowledge Questionnaire Score:  Knowledge Questionnaire Score - 08/01/21 1456        Knowledge Questionnaire Score   Pre Score 20/24             Core Components/Risk Factors/Patient Goals at Admission:  Personal Goals and Risk Factors at Admission - 08/01/21 1459       Core Components/Risk Factors/Patient Goals on Admission    Weight Management Yes;Obesity;Weight Loss    Intervention Weight Management: Develop a combined nutrition and exercise program designed to reach desired caloric intake, while maintaining appropriate intake of nutrient and fiber, sodium and fats, and appropriate energy expenditure required for the weight goal.;Weight Management: Provide education and appropriate resources to help participant work on and attain dietary goals.;Weight Management/Obesity: Establish reasonable short term and long term weight goals.;Obesity: Provide education and appropriate resources to help participant work on and attain dietary goals.    Admit Weight 221 lb 3.2 oz (100.3 kg)    Expected Outcomes Short Term: Continue to assess and modify interventions until short term weight is achieved;Long Term: Adherence to nutrition and physical activity/exercise program aimed toward attainment of established weight goal;Understanding recommendations for meals to include 15-35% energy as protein, 25-35% energy from fat, 35-60% energy from carbohydrates, less than 200mg  of dietary cholesterol, 20-35 gm of total fiber daily;Weight Loss: Understanding of general recommendations for a balanced deficit meal plan, which promotes 1-2 lb weight loss per week and includes a negative energy balance of [id number] kcal/d;Understanding of distribution of calorie intake throughout the day with the consumption of 4-5 meals/snacks    Diabetes Yes    Intervention Provide education about signs/symptoms and action to take for hypo/hyperglycemia.;Provide education about proper nutrition, including hydration, and aerobic/resistive exercise prescription along with prescribed medications to achieve blood  glucose in normal ranges: Fasting glucose 65-99 mg/dL    Expected  Outcomes Short Term: Participant verbalizes understanding of the signs/symptoms and immediate care of hyper/hypoglycemia, proper foot care and importance of medication, aerobic/resistive exercise and nutrition plan for blood glucose control.;Long Term: Attainment of HbA1C < 7%.    Hypertension Yes    Intervention Provide education on lifestyle modifcations including regular physical activity/exercise, weight management, moderate sodium restriction and increased consumption of fresh fruit, vegetables, and low fat dairy, alcohol moderation, and smoking cessation.;Monitor prescription use compliance.    Expected Outcomes Short Term: Continued assessment and intervention until BP is < 140/52mm HG in hypertensive participants. < 130/28mm HG in hypertensive participants with diabetes, heart failure or chronic kidney disease.;Long Term: Maintenance of blood pressure at goal levels.    Lipids Yes    Intervention Provide education and support for participant on nutrition & aerobic/resistive exercise along with prescribed medications to achieve LDL 70mg , HDL >40mg .    Expected Outcomes Short Term: Participant states understanding of desired cholesterol values and is compliant with medications prescribed. Participant is following exercise prescription and nutrition guidelines.;Long Term: Cholesterol controlled with medications as prescribed, with individualized exercise RX and with personalized nutrition plan. Value goals: LDL < 70mg , HDL > 40 mg.    Personal Goal Other Yes    Personal Goal Short term: walk for longer, decrease SOB Long term: better heart health    Intervention will continue to monitor pt and progress workloads as tolerated without sign or symptom.    Expected Outcomes Pt will achieve his goals             Core Components/Risk Factors/Patient Goals Review:   Goals and Risk Factor Review     Row Name 08/08/21 0803 08/27/21  1437           Core Components/Risk Factors/Patient Goals Review   Personal Goals Review Weight Management/Obesity;Stress;Hypertension;Lipids;Diabetes Weight Management/Obesity;Stress;Hypertension;Lipids;Diabetes      Review [name] started cardiac rehab on 08/07/21 and did well with exercise for his fitness level. [name] is somewhat deconditioned. Vital signs and CBG's were stable. [name] returned to exercise after being out last week with a cold. Vital signs and CBG's were stable on 08/26/21. Will continue to monitor.      Expected Outcomes [name] will continue to participate in phase 2 cardiac rehab for exercise, nutrition and lifestyle modifications [name] will continue to participate in phase 2 cardiac rehab for exercise, nutrition and lifestyle modifications               Core Components/Risk Factors/Patient Goals at Discharge (Final Review):   Goals and Risk Factor Review - 08/27/21 1437       Core Components/Risk Factors/Patient Goals Review   Personal Goals Review Weight Management/Obesity;Stress;Hypertension;Lipids;Diabetes    Review [name] returned to exercise after being out last week with a cold. Vital signs and CBG's were stable on 08/26/21. Will continue to monitor.    Expected Outcomes [name] will continue to participate in phase 2 cardiac rehab for exercise, nutrition and lifestyle modifications             ITP Comments:  ITP Comments     Row Name 08/01/21 1340 08/08/21 0807 08/27/21 1435       ITP Comments Dr [name] MD, Medical Director 30 Day ITP Review. [name] started cardiac rehab on 08/07/21. [name] did fair with exercise for his fitness level. 30 Day ITP Review. [name] returned to exercise after being absent due a cold on 08/26/21.              Comments: See  ITP comments.Barnet Pall, RN,BSN 08/27/2021 2:40 PM

## 2021-08-28 ENCOUNTER — Other Ambulatory Visit: Payer: Self-pay

## 2021-08-28 ENCOUNTER — Encounter (HOSPITAL_COMMUNITY)
Admission: RE | Admit: 2021-08-28 | Discharge: 2021-08-28 | Disposition: A | Discharge status: Home / Self Care | Payer: Medicare Other | Source: Ambulatory Visit | Attending: Cardiology | Admitting: Cardiology

## 2021-08-28 ENCOUNTER — Other Ambulatory Visit: Payer: Medicare Other | Admitting: *Deleted

## 2021-08-28 DIAGNOSIS — Z79899 Other long term (current) drug therapy: Secondary | ICD-10-CM | POA: Diagnosis not present

## 2021-08-28 DIAGNOSIS — N183 Chronic kidney disease, stage 3 unspecified: Secondary | ICD-10-CM | POA: Diagnosis not present

## 2021-08-28 DIAGNOSIS — Z5189 Encounter for other specified aftercare: Secondary | ICD-10-CM | POA: Diagnosis not present

## 2021-08-28 DIAGNOSIS — Z955 Presence of coronary angioplasty implant and graft: Secondary | ICD-10-CM

## 2021-08-28 DIAGNOSIS — E119 Type 2 diabetes mellitus without complications: Secondary | ICD-10-CM | POA: Diagnosis not present

## 2021-08-29 LAB — BASIC METABOLIC PANEL
BUN/Creatinine Ratio: 10 (ref 10–24)
BUN: 14 mg/dL (ref 8–27)
CO2: 21 mmol/L (ref 20–29)
Calcium: 9.1 mg/dL (ref 8.6–10.2)
Chloride: 105 mmol/L (ref 96–106)
Creatinine, Ser: 1.41 mg/dL — ABNORMAL HIGH (ref 0.76–1.27)
Glucose: 164 mg/dL — ABNORMAL HIGH (ref 70–99)
Potassium: 4.1 mmol/L (ref 3.5–5.2)
Sodium: 145 mmol/L — ABNORMAL HIGH (ref 134–144)
eGFR: 55 mL/min/{1.73_m2} — ABNORMAL LOW (ref >59)

## 2021-08-30 ENCOUNTER — Encounter (HOSPITAL_COMMUNITY)
Admission: RE | Admit: 2021-08-30 | Discharge: 2021-08-30 | Disposition: A | Discharge status: Home / Self Care | Payer: Medicare Other | Source: Ambulatory Visit | Attending: Cardiology | Admitting: Cardiology

## 2021-08-30 ENCOUNTER — Other Ambulatory Visit: Payer: Self-pay

## 2021-08-30 DIAGNOSIS — Z955 Presence of coronary angioplasty implant and graft: Secondary | ICD-10-CM

## 2021-08-30 DIAGNOSIS — Z5189 Encounter for other specified aftercare: Secondary | ICD-10-CM | POA: Diagnosis not present

## 2021-08-30 DIAGNOSIS — E119 Type 2 diabetes mellitus without complications: Secondary | ICD-10-CM | POA: Diagnosis not present

## 2021-09-02 ENCOUNTER — Encounter (HOSPITAL_COMMUNITY)
Admission: RE | Admit: 2021-09-02 | Discharge: 2021-09-02 | Disposition: A | Discharge status: Home / Self Care | Payer: Medicare Other | Source: Ambulatory Visit | Attending: Cardiology | Admitting: Cardiology

## 2021-09-02 ENCOUNTER — Other Ambulatory Visit (HOSPITAL_COMMUNITY): Payer: Self-pay

## 2021-09-02 ENCOUNTER — Other Ambulatory Visit: Payer: Self-pay | Admitting: Endocrinology

## 2021-09-02 ENCOUNTER — Other Ambulatory Visit: Payer: Self-pay

## 2021-09-02 DIAGNOSIS — E119 Type 2 diabetes mellitus without complications: Secondary | ICD-10-CM | POA: Diagnosis not present

## 2021-09-02 DIAGNOSIS — Z955 Presence of coronary angioplasty implant and graft: Secondary | ICD-10-CM | POA: Diagnosis not present

## 2021-09-02 DIAGNOSIS — Z5189 Encounter for other specified aftercare: Secondary | ICD-10-CM | POA: Diagnosis not present

## 2021-09-02 MED ORDER — ALLOPURINOL 100 MG PO TABS
200.0000 mg | ORAL_TABLET | Freq: Every day | ORAL | 0 refills | Status: DC
  Filled 2021-09-02: qty 60, 30d supply, fill #0

## 2021-09-02 MED ORDER — HUMALOG MIX 50/50 KWIKPEN (50-50) 100 UNIT/ML ~~LOC~~ SUPN
95.0000 [IU] | PEN_INJECTOR | Freq: Every day | SUBCUTANEOUS | 3 refills | Status: DC
  Filled 2021-09-02: qty 105, 110d supply, fill #0

## 2021-09-02 MED FILL — Tafamidis Cap 61 MG: ORAL | 30 days supply | Qty: 30 | Fill #9 | Status: AC

## 2021-09-03 ENCOUNTER — Other Ambulatory Visit (HOSPITAL_COMMUNITY): Payer: Self-pay

## 2021-09-04 ENCOUNTER — Encounter (HOSPITAL_COMMUNITY)
Admission: RE | Admit: 2021-09-04 | Discharge: 2021-09-04 | Disposition: A | Discharge status: Home / Self Care | Payer: Medicare Other | Source: Ambulatory Visit | Attending: Cardiology | Admitting: Cardiology

## 2021-09-04 ENCOUNTER — Other Ambulatory Visit: Payer: Self-pay

## 2021-09-04 DIAGNOSIS — Z5189 Encounter for other specified aftercare: Secondary | ICD-10-CM | POA: Diagnosis not present

## 2021-09-04 DIAGNOSIS — Z955 Presence of coronary angioplasty implant and graft: Secondary | ICD-10-CM

## 2021-09-04 DIAGNOSIS — E119 Type 2 diabetes mellitus without complications: Secondary | ICD-10-CM | POA: Diagnosis not present

## 2021-09-04 NOTE — Progress Notes (Signed)
CARDIAC REHAB PHASE 2  Reviewed home exercise with pt today. Pt is tolerating exercise well. Pt will continue to exercise on their own by walking and doing chair fitness exercises on Youtube for 30 minutes per session 1-2 days a week in addition to the 3 days in CRP2. Advised pt on THRR, RPE scale, hydration and temperature/humidity precautions. Reinforced NTG use, S/S to stop exercise and when to call MD vs 911. Encouraged warm up cool down and stretches with exercise sessions. Pt verbalized understanding, all questions were answered and pt was given a copy to take home.    Marco [name] ACSM-CEP 09/04/2021 4:37 PM

## 2021-09-05 ENCOUNTER — Other Ambulatory Visit (HOSPITAL_COMMUNITY): Payer: Self-pay

## 2021-09-06 ENCOUNTER — Encounter (HOSPITAL_COMMUNITY): Payer: Medicare Other

## 2021-09-06 DIAGNOSIS — R300 Dysuria: Secondary | ICD-10-CM | POA: Diagnosis not present

## 2021-09-09 ENCOUNTER — Other Ambulatory Visit: Payer: Self-pay

## 2021-09-09 ENCOUNTER — Other Ambulatory Visit (HOSPITAL_COMMUNITY): Payer: Self-pay

## 2021-09-09 ENCOUNTER — Encounter (HOSPITAL_COMMUNITY)
Admission: RE | Admit: 2021-09-09 | Discharge: 2021-09-09 | Disposition: A | Discharge status: Home / Self Care | Payer: Medicare Other | Source: Ambulatory Visit | Attending: Cardiology | Admitting: Cardiology

## 2021-09-09 DIAGNOSIS — Z5189 Encounter for other specified aftercare: Secondary | ICD-10-CM | POA: Diagnosis not present

## 2021-09-09 DIAGNOSIS — E119 Type 2 diabetes mellitus without complications: Secondary | ICD-10-CM | POA: Diagnosis not present

## 2021-09-09 DIAGNOSIS — Z955 Presence of coronary angioplasty implant and graft: Secondary | ICD-10-CM | POA: Diagnosis not present

## 2021-09-09 LAB — GLUCOSE, CAPILLARY: Glucose-Capillary: 105 mg/dL — ABNORMAL HIGH (ref 70–99)

## 2021-09-09 NOTE — Progress Notes (Signed)
Incomplete Session Note  Patient Details  Name: Marco [name] MRN: [id number] Date of Birth: Jun 29, 1956 Referring Provider:   Flowsheet Row CARDIAC REHAB PHASE II ORIENTATION from 08/01/2021 in [facility]  Referring Provider Dr. [name], MD       [name] did not complete his rehab session.  CBG 98 by [name]'s CGM. Patient was given graham crackers and peanut butter. CBG 105 by the hospital meter. I advised [name] not to exercise today due to  CBG parameters for exercise of greater than 110. [name] had  breakfast at 12:30. [name] said he will make sure that he eats a meal with a protein and a carbohydrate or have a snack if necessary before returning to exercise on Wednesday.Harrell Gave RN BSN

## 2021-09-10 DIAGNOSIS — G4733 Obstructive sleep apnea (adult) (pediatric): Secondary | ICD-10-CM | POA: Diagnosis not present

## 2021-09-10 DIAGNOSIS — G4700 Insomnia, unspecified: Secondary | ICD-10-CM | POA: Diagnosis not present

## 2021-09-10 DIAGNOSIS — R2681 Unsteadiness on feet: Secondary | ICD-10-CM | POA: Diagnosis not present

## 2021-09-10 DIAGNOSIS — R269 Unspecified abnormalities of gait and mobility: Secondary | ICD-10-CM | POA: Diagnosis not present

## 2021-09-11 ENCOUNTER — Encounter (HOSPITAL_COMMUNITY)
Admission: RE | Admit: 2021-09-11 | Discharge: 2021-09-11 | Disposition: A | Discharge status: Home / Self Care | Payer: Medicare Other | Source: Ambulatory Visit | Attending: Cardiology | Admitting: Cardiology

## 2021-09-11 ENCOUNTER — Other Ambulatory Visit (HOSPITAL_COMMUNITY): Payer: Self-pay

## 2021-09-11 ENCOUNTER — Other Ambulatory Visit: Payer: Self-pay

## 2021-09-11 DIAGNOSIS — Z955 Presence of coronary angioplasty implant and graft: Secondary | ICD-10-CM | POA: Insufficient documentation

## 2021-09-11 DIAGNOSIS — N183 Chronic kidney disease, stage 3 unspecified: Secondary | ICD-10-CM | POA: Diagnosis not present

## 2021-09-11 DIAGNOSIS — I129 Hypertensive chronic kidney disease with stage 1 through stage 4 chronic kidney disease, or unspecified chronic kidney disease: Secondary | ICD-10-CM | POA: Diagnosis not present

## 2021-09-11 DIAGNOSIS — E1122 Type 2 diabetes mellitus with diabetic chronic kidney disease: Secondary | ICD-10-CM | POA: Diagnosis not present

## 2021-09-11 DIAGNOSIS — I509 Heart failure, unspecified: Secondary | ICD-10-CM | POA: Diagnosis not present

## 2021-09-11 DIAGNOSIS — N281 Cyst of kidney, acquired: Secondary | ICD-10-CM | POA: Diagnosis not present

## 2021-09-11 DIAGNOSIS — R300 Dysuria: Secondary | ICD-10-CM | POA: Diagnosis not present

## 2021-09-11 NOTE — Progress Notes (Signed)
Patient has a 7 beat run of PAT. Nonsustained. Rate 150-160. Patient asymptomatic. Blood pressure 104/68. Telemetry rhythm Sinus rate 78. Vin Bhagat PAC paged and notified. No new order received. Will continue to monitor the patient throughout  the program.Lugene Hitt Venetia Maxon, RN,BSN 09/11/2021 4:23 PM

## 2021-09-12 DIAGNOSIS — M542 Cervicalgia: Secondary | ICD-10-CM | POA: Diagnosis not present

## 2021-09-12 DIAGNOSIS — G890 Central pain syndrome: Secondary | ICD-10-CM | POA: Diagnosis not present

## 2021-09-12 DIAGNOSIS — M79661 Pain in right lower leg: Secondary | ICD-10-CM | POA: Diagnosis not present

## 2021-09-12 DIAGNOSIS — G894 Chronic pain syndrome: Secondary | ICD-10-CM | POA: Diagnosis not present

## 2021-09-12 DIAGNOSIS — M25512 Pain in left shoulder: Secondary | ICD-10-CM | POA: Diagnosis not present

## 2021-09-12 DIAGNOSIS — Z79891 Long term (current) use of opiate analgesic: Secondary | ICD-10-CM | POA: Diagnosis not present

## 2021-09-12 DIAGNOSIS — E1143 Type 2 diabetes mellitus with diabetic autonomic (poly)neuropathy: Secondary | ICD-10-CM | POA: Diagnosis not present

## 2021-09-12 DIAGNOSIS — M79662 Pain in left lower leg: Secondary | ICD-10-CM | POA: Diagnosis not present

## 2021-09-12 DIAGNOSIS — M25511 Pain in right shoulder: Secondary | ICD-10-CM | POA: Diagnosis not present

## 2021-09-12 DIAGNOSIS — M545 Low back pain, unspecified: Secondary | ICD-10-CM | POA: Diagnosis not present

## 2021-09-13 ENCOUNTER — Telehealth (HOSPITAL_COMMUNITY): Payer: Self-pay | Admitting: Internal Medicine

## 2021-09-13 ENCOUNTER — Telehealth: Payer: Self-pay | Admitting: Internal Medicine

## 2021-09-13 ENCOUNTER — Telehealth: Payer: Self-pay | Admitting: Cardiology

## 2021-09-13 ENCOUNTER — Encounter (HOSPITAL_COMMUNITY): Payer: Medicare Other

## 2021-09-13 NOTE — Telephone Encounter (Signed)
Pt c/o medication issue:  1. Name of Medication:  diclofenac potassium  Cyclobenzaprine hcl inflammatory   2. How are you currently taking this medication (dosage and times per day)?   3. Are you having a reaction (difficulty breathing--STAT)? no  4. What is your medication issue? Patient states he was given the medications for pain and inflammation. He would like to know if they are safe to take and if they would cause any interreactions with his other medications since his regular potassium was cut down.

## 2021-09-13 NOTE — Telephone Encounter (Signed)
Spoke with the patient and advised him on recommendations from PharmD. Patient verbalized understanding.

## 2021-09-13 NOTE — Telephone Encounter (Signed)
Faxed HST results from 2017 to [facility] via Epic routing. Nothing further needed at this time.

## 2021-09-13 NOTE — Telephone Encounter (Signed)
Concurrent use of GABAPENTIN and cyclobenzaprine may result in respiratory depression.  Concurrent use of CYCLOBENZAPRINE and duloxetine may result in increased risk of serotonin syndrome.

## 2021-09-15 DIAGNOSIS — J9601 Acute respiratory failure with hypoxia: Secondary | ICD-10-CM | POA: Diagnosis not present

## 2021-09-16 ENCOUNTER — Encounter (HOSPITAL_COMMUNITY)
Admission: RE | Admit: 2021-09-16 | Discharge: 2021-09-16 | Disposition: A | Discharge status: Home / Self Care | Payer: Medicare Other | Source: Ambulatory Visit | Attending: Cardiology | Admitting: Cardiology

## 2021-09-16 ENCOUNTER — Other Ambulatory Visit: Payer: Self-pay

## 2021-09-16 DIAGNOSIS — Z955 Presence of coronary angioplasty implant and graft: Secondary | ICD-10-CM | POA: Diagnosis not present

## 2021-09-17 ENCOUNTER — Ambulatory Visit: Payer: Medicare Other | Attending: Urology

## 2021-09-17 DIAGNOSIS — N5201 Erectile dysfunction due to arterial insufficiency: Secondary | ICD-10-CM | POA: Insufficient documentation

## 2021-09-17 DIAGNOSIS — R102 Pelvic and perineal pain: Secondary | ICD-10-CM | POA: Diagnosis not present

## 2021-09-17 DIAGNOSIS — R279 Unspecified lack of coordination: Secondary | ICD-10-CM | POA: Diagnosis not present

## 2021-09-17 DIAGNOSIS — Z87442 Personal history of urinary calculi: Secondary | ICD-10-CM | POA: Diagnosis not present

## 2021-09-17 DIAGNOSIS — R300 Dysuria: Secondary | ICD-10-CM | POA: Diagnosis not present

## 2021-09-17 DIAGNOSIS — M62838 Other muscle spasm: Secondary | ICD-10-CM | POA: Insufficient documentation

## 2021-09-17 DIAGNOSIS — M6281 Muscle weakness (generalized): Secondary | ICD-10-CM | POA: Insufficient documentation

## 2021-09-17 NOTE — Therapy (Signed)
[facility] @ [address] [place], [place], [address] Phone: [phone]   Fax:  [phone]  Physical Therapy Evaluation  Patient Details  Name: Marco [name] MRN: [id number] Date of Birth: 1956-07-05 Referring Provider (PT): Robley Fries, MD   Encounter Date: 09/17/2021   PT End of Session - 09/17/21 1226     Visit Number 1    Date for PT Re-Evaluation 11/26/21    Authorization Type UHC Medicare    Progress Note Due on Visit 10    PT Start Time 1146    PT Stop Time 1223    PT Time Calculation (min) 37 min    Activity Tolerance Patient tolerated treatment well    Behavior During Therapy [facility] for tasks assessed/performed             Past Medical History:  Diagnosis Date   Benign colon polyp 08/01/2013   [facility] in [place]. large base tranverse colon polyp was biopsied. polyp was benign with minimal surface hyperplastic change.   Chronic combined systolic and diastolic CHF (congestive heart failure) (HCC)    Chronic pain    CKD (chronic kidney disease), stage III (HCC)    Diabetes mellitus without complication ([place])    Diverticulosis    Esophageal hiatal hernia 07/29/2013   confirmed on EGD    Esophageal stricture    Essential hypertension    Gastritis 07/29/2013   confirmed on EGD, bx done an negative for intestinal metaplasia, dsyplasia or H. pylori. normal gastric emptying study done 07/13/2013.   GERD (gastroesophageal reflux disease)    Morbid obesity (HCC)    Non-obstructive CAD    Coronary CTA 04/2021 showed coronary calcium score of 535 which is 93rd % for age and sex matched controls.  There is mild CAD with mild calcified plaque in the distal LAD 25-49% and also in the ostial D3.  There is minimal calcified plaque in the mid LCx and mild mixed plaque in the pRCA with 25-49% stenosis.   Persistent atrial fibrillation ([place])    a. 02/2013 s/p rfca in [place], [place]-->prev on Xarelto,  d/c'd 2/2 anemia, ? GIB.   Rheumatoid arthritis ([place])    Sigmoid diverticulosis 08/01/2013   confirmed on colonscopy. record scanned into chart    Past Surgical History:  Procedure Laterality Date   CARDIAC CATHETERIZATION  05/2014   ablation for atrial fibrillation   CARDIAC CATHETERIZATION N/A 11/16/2015   Procedure: Left Heart Cath and Coronary Angiography;  Surgeon: Leonie Man, MD;  Location: [facility];  Service: Cardiovascular;  Laterality: N/A;   COLON RESECTION  09/2013   due to large, abnormal polpy. non cancerous per patient.    COLON SURGERY  09/2014   colon resection    CORONARY STENT INTERVENTION N/A 06/11/2021   Procedure: CORONARY STENT INTERVENTION;  Surgeon: Jettie Booze, MD;  Location: [facility];  Service: Cardiovascular;  Laterality: N/A;   EXTRACORPOREAL SHOCK WAVE LITHOTRIPSY Left 01/03/2021   Procedure: EXTRACORPOREAL SHOCK WAVE LITHOTRIPSY (ESWL);  Surgeon: Robley Fries, MD;  Location: [facility];  Service: Urology;  Laterality: Left;   IR PERC PLEURAL DRAIN W/INDWELL CATH W/IMG GUIDE  01/07/2020   LEFT HEART CATH AND CORONARY ANGIOGRAPHY N/A 05/19/2018   Procedure: LEFT HEART CATH AND CORONARY ANGIOGRAPHY;  Surgeon: Wellington Hampshire, MD;  Location: [facility];  Service: Cardiovascular;  Laterality: N/A;   LEFT HEART CATH AND CORONARY ANGIOGRAPHY  N/A 06/11/2021   Procedure: LEFT HEART CATH AND CORONARY ANGIOGRAPHY;  Surgeon: Jettie Booze, MD;  Location: [facility];  Service: Cardiovascular;  Laterality: N/A;    There were no vitals filed for this visit.    Subjective Assessment - 09/17/21 1146     Subjective Pt states that he is having severe pain with urination for the last 2-3 years. He previously had urethral dilation with was helpful, but did not last for very long. He has long history of low back pain and BLE pain. He is not currently experiencing any pelvic/penile pain; pain stops as soon  as he is finished urinating. He also reports diffiuclty starting stream. He drinks little to no water.    Pertinent History 06/21/20 Rt perinephric abscess; Lt kidney stone and lithotripsy 01/03/21; 09/06/20 bulbar urethral stricture dilated    Patient Stated Goals To decrease pain with urination    Currently in Pain? No/denies    Multiple Pain Sites No                [facility] PT Assessment - 09/17/21 0001       Assessment   Medical Diagnosis R30.0 (ICD-10-CM) - Dysuria  N52.01 (ICD-10-CM) - Erectile dysfunction due to arterial insufficiency  Z87.442 (ICD-10-CM) - Personal history of urinary calculi  R10.2 (ICD-10-CM) - Pelvic and perineal pain  Z12.5 (ICD-10-CM) - Encounter for screening for malignant neoplasm of prostate    Referring Provider (PT) Robley Fries, MD    Onset Date/Surgical Date 08/11/18    Prior Therapy yes      Precautions   Precautions None      Restrictions   Weight Bearing Restrictions No      Balance Screen   Has the patient fallen in the past 6 months No    Has the patient had a decrease in activity level because of a fear of falling?  No    Is the patient reluctant to leave their home because of a fear of falling?  No      Home Ecologist residence    Living Arrangements Alone      Prior Function   Level of Independence Independent      Cognition   Overall Cognitive Status Within Functional Limits for tasks assessed      Functional Tests   Functional tests Single leg stance   <2 seconds bil     Posture/Postural Control   Posture Comments Severely roundd shoulders/forward head posture, reduced lumbar lordosis, decreased Bil hip extension in standing      ROM / Strength   AROM / PROM / Strength AROM;Strength      AROM   Overall AROM Comments Lumbar: flexion reduced 75%, extension reduced by 75%, Bil side bend reduced by 50%, bil rotation reduced by 50%      Strength   Overall Strength Comments Hip strength  grossly 4-/5      Palpation   Palpation comment Significiant tightness and tenderness throughout abdomen with exquisite tenderness over bladder                        Objective measurements completed on examination: See above findings.     Pelvic Floor Special Questions - 09/17/21 0001     Prior Pelvic/Prostate Exam Yes    Currently Sexually Active No   not currently sexually active; he is able to ejaculate, but he feels the same pain with ejaculation as he  feels with urination   History of sexually transmitted disease No    Urinary Leakage No    Urinary urgency Yes   he is on lasix   Urinary frequency every 2-3 hours    Fecal incontinence Yes   when he urinates, he will get urge to have bowel movement, and sometimes he will start to have bowel movement without control - feels like this started after stent was placed.   Fluid intake very little to none   water makes him nauseous   Caffeine beverages no              [facility] Adult PT Treatment/Exercise - 09/17/21 0001       Exercises   Exercises Lumbar      Lumbar Exercises: Stretches   Lower Trunk Rotation Limitations 30x    Piriformis Stretch Right;Left;1 rep;60 seconds      Lumbar Exercises: Supine   Other Supine Lumbar Exercises Bent knee fall out 10x bil; supine march 2 x 10; glute set 5 x 10 sec; hip adduction isometric 5 x 5 sec                     PT Education - 09/17/21 1220     Education Details Pt education performed on condition, treatment details, increasing water intake, and initial HEP.[id number]    Person(s) Educated Patient    Methods Explanation;Demonstration;Tactile cues;Verbal cues;Handout    Comprehension Verbalized understanding              PT Short Term Goals - 09/17/21 1236       PT SHORT TERM GOAL #1   Title Pt will be independent with HEP.    Time 4    Period Weeks    Status New    Target Date 10/15/21      PT SHORT TERM GOAL #2   Title Pt will improve  all lumbopelvic A/ROM by 25% in order to help improve abdominal/pelvic floor muscle mobility and circulation.    Time 4    Period Weeks    Status New    Target Date 10/15/21      PT SHORT TERM GOAL #3   Title Pt will increase water intake to 4 8oz glasses a day to help decrease pain with urination.    Time 4    Period Weeks    Status New    Target Date 10/15/21               PT Long Term Goals - 09/17/21 1238       PT LONG TERM GOAL #1   Title Pt will be independent with advanced HEP.    Time 10    Period Weeks    Status New    Target Date 12/10/21      PT LONG TERM GOAL #2   Title Pt will demonstrate increase in all impaired hip strength by 1 muscle grade in order to demonstrate improved lumbopelvic support and increase functional ability.    Time 10    Period Weeks    Status New    Target Date 11/26/21      PT LONG TERM GOAL #3   Title Pt will demonstrate normal pelvic floor muscle tone and A/ROM, able to achieve 4/5 strength with contractions and 10 sec endurance, in order to provide appropriate lumbopelvic support in functional activities.    Time 10    Period Weeks    Status New    Target Date  11/26/21      PT LONG TERM GOAL #4   Title Pt will report less than 2/10 with urination.    Time 10    Period Weeks    Status New    Target Date 11/26/21                    Plan - 09/17/21 1228     Clinical Impression Statement Pt is a 66 year old male with chief complaint of pain with urination for the last 2-3 years. Exam findings notable for signficant decrease in lumbopelvic A/ROM, decreased Bil hip strength, balance no greater than 2 seconds bil, and tenderness throughout abdomen and exquisite tenderness directly over bladder; he declined internal rectal exam for better assessment of pelvic floor this treatment session. Signs and symptoms are most consistent with significant reduction in water intake, decreased mobility/weakness globally, and  bladder/urethral restriction. Initial treatment included exercises to focus on abdominal and lumbopelvic mobility and gentle strengthening to increase circulation/mobility of pelvic floor. He will benefit from skilled PT intervention in order to address impairments, decrease pain with urination, and improve QOL.    Personal Factors and Comorbidities Comorbidity 1;Comorbidity 2;Comorbidity 3+    Comorbidities 06/21/20 Rt perinephric abscess; Lt kidney stone and lithotripsy 01/03/21; 09/06/20 bulbar urethral stricture dilated    Examination-Participation Restrictions Community Activity    Stability/Clinical Decision Making Stable/Uncomplicated    Clinical Decision Making Low    Rehab Potential Fair    PT Frequency 1x / week    PT Duration 12 weeks    PT Treatment/Interventions ADLs/Self Care Home Management;Biofeedback;Electrical Stimulation;Cryotherapy;Moist Heat;Therapeutic activities;Therapeutic exercise;Neuromuscular re-education;Manual techniques;Patient/family education;Scar mobilization;Passive range of motion;Dry needling;Spinal Manipulations    PT Next Visit Plan Plan to perform abdominal manual techniques to improve mobility and tenderness; progress mobility and gentle strengthening exercises; diaphragmatic breathing; push water intake.    PT Home Exercise Plan [id number]    Consulted and Agree with Plan of Care Patient             Patient will benefit from skilled therapeutic intervention in order to improve the following deficits and impairments:  Abnormal gait, Increased fascial restricitons, Impaired tone, Decreased endurance, Pain, Decreased activity tolerance, Decreased scar mobility, Hypomobility, Decreased mobility, Decreased strength, Decreased range of motion, Decreased coordination  Visit Diagnosis: Muscle weakness (generalized)  Other muscle spasm  Unspecified lack of coordination     Problem List Patient Active Problem List   Diagnosis Date Noted    Immunodeficiency, unspecified ([place]) 08/13/2021   Abnormal stress test    Cellulitis of finger of left hand 10/20/2020   Nasal congestion 10/20/2020   History of Clostridium difficile infection 10/20/2020   Cough 10/14/2020   Healthcare maintenance 06/15/2020   Perinephric abscess 05/13/2020   Pyohydronephrosis 05/13/2020   Vitamin B12 deficiency 02/19/2020   Elevated LFTs    Pleural effusion    Weakness    Loculated pleural effusion    Complex renal cyst    Urethral stricture 12/30/2019   Nephrolithiasis 12/30/2019   Acute febrile illness 12/30/2019   Sepsis ([place]) 12/27/2019   Severe sepsis ([place]) 81/08/7508   Acute metabolic encephalopathy 25/85/2778   Sepsis due to Gram negative bacteria ([place]) 12/09/2019   Rhinitis 10/16/2019   Gastroesophageal reflux disease without esophagitis 08/31/2018   Muscle spasm 08/31/2018   Cardiac amyloidosis (HCC)    Hypokalemia    Acute renal failure superimposed on stage 3 chronic kidney disease ([place]) 08/27/2017   Hyperlipidemia 12/23/2016   Chronic combined systolic and  diastolic CHF (congestive heart failure) ([place]) 09/09/2016   Diabetic polyneuropathy associated with type 2 diabetes mellitus ([place]) 06/10/2016   Vision changes 06/02/2016   Myalgia and myositis 03/31/2016   Shortness of breath 02/09/2016   Chronic gouty arthritis 11/29/2015   LBP (low back pain) 11/27/2015   Arthralgia of multiple joints 11/27/2015   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[place]) 11/27/2015   Chest pain of uncertain etiology 73/57/8978   Sinusitis, chronic 10/16/2015   Chronic insomnia 10/16/2015   New onset of headaches after age 57 09/20/2015   OSA (obstructive sleep apnea) 07/12/2015   Low serum testosterone level 05/18/2015   Depression 05/16/2015   Morbid obesity due to excess calories (HCC)    Chronic pain    Type 2 diabetes mellitus with complication, with long-term current use of insulin (HCC)    Rheumatoid arthritis ([place]) 02/05/2015   Elevated troponin 12/24/2014    CAD (coronary artery disease) 12/24/2014   Atrial fibrillation ([place]) 12/24/2014   Hypertension 12/24/2014   Chronic kidney disease 12/24/2014   PUD (peptic ulcer disease) 12/24/2014    Heather Roberts, PT, DPT02/07/231:33 PM   [facility] @ [address] [place], [place], [address] Phone: [phone]   Fax:  [phone]  Name: VUE PAVON MRN: [id number] Date of Birth: 26-Feb-1956

## 2021-09-17 NOTE — Patient Instructions (Signed)
Access Code: [email or web address] URL: [email or web address] Date: 09/17/2021 Prepared by: Heather Roberts  Exercises Supine Lower Trunk Rotation - 1 x daily - 7 x weekly - 2 sets - 10 reps Bent Knee Fallouts - 1 x daily - 7 x weekly - 2 sets - 10 reps Supine March - 1 x daily - 7 x weekly - 2 sets - 10 reps Supine Piriformis Stretch with Foot on Ground - 1 x daily - 7 x weekly - 1 sets - 2 reps - 30 hold Hooklying Gluteal Sets - 1 x daily - 7 x weekly - 1 sets - 10 reps - 5 hold Supine Hip Adduction Isometric with Ball - 1 x daily - 7 x weekly - 1 sets - 10 reps - 5 hold  [facility] [address] [place], [place] [address] Phone # [phone] Fax [phone]

## 2021-09-18 ENCOUNTER — Other Ambulatory Visit: Payer: Self-pay

## 2021-09-18 ENCOUNTER — Encounter (HOSPITAL_COMMUNITY)
Admission: RE | Admit: 2021-09-18 | Discharge: 2021-09-18 | Disposition: A | Discharge status: Home / Self Care | Payer: Medicare Other | Source: Ambulatory Visit | Attending: Cardiology | Admitting: Cardiology

## 2021-09-18 DIAGNOSIS — Z955 Presence of coronary angioplasty implant and graft: Secondary | ICD-10-CM

## 2021-09-19 ENCOUNTER — Other Ambulatory Visit (HOSPITAL_COMMUNITY): Payer: Self-pay

## 2021-09-20 ENCOUNTER — Other Ambulatory Visit (HOSPITAL_COMMUNITY): Payer: Self-pay

## 2021-09-20 ENCOUNTER — Other Ambulatory Visit: Payer: Self-pay

## 2021-09-20 ENCOUNTER — Encounter (HOSPITAL_COMMUNITY)
Admission: RE | Admit: 2021-09-20 | Discharge: 2021-09-20 | Disposition: A | Discharge status: Home / Self Care | Payer: Medicare Other | Source: Ambulatory Visit | Attending: Cardiology | Admitting: Cardiology

## 2021-09-20 ENCOUNTER — Encounter: Payer: Self-pay | Admitting: Student

## 2021-09-20 DIAGNOSIS — Z955 Presence of coronary angioplasty implant and graft: Secondary | ICD-10-CM

## 2021-09-20 DIAGNOSIS — L309 Dermatitis, unspecified: Secondary | ICD-10-CM | POA: Diagnosis not present

## 2021-09-20 NOTE — Progress Notes (Signed)
Incomplete Session Note  Patient Details  Name: Marco [name] MRN: [id number] Date of Birth: 1956/04/06 Referring Provider:   Flowsheet Row CARDIAC REHAB PHASE II ORIENTATION from 08/01/2021 in [facility]  Referring Provider Dr. [name], MD       [name] did not complete his rehab session.  [name] reported feeling light headed during stretches during warm up sat down. CBG 178 via CGM. Blood pressure 122/70 sitting heart rate 73. Oxygen saturation 96% on room air. Standing blood pressure 104/72 standing. Patient given 8 ounces of water. Repeat sitting blood pressure 112/70. Standing blood pressure 112/70. [name]. [name]. No exercise today due to symptoms. Recheck BP 114/60. [name]. Had feet elevated for a few minutes. Symptoms improved.  Telemetry rhythm Sinus with an occasional PVC. Sande Rives [facility] paged and notified. [name] to be sure that he hydrating enough and to keep a log of his blood pressures. [name] will notify Dr Theodosia Blender office if he continues to have problems. Symptoms resolved upon exit from phase 2 cardiac rehab. Callie say that [name] is okay to return to exercise on Monday.Barnet Pall, RN,BSN 09/20/2021 4:28 PM

## 2021-09-20 NOTE — Progress Notes (Unsigned)
° °  Called by Cardiac Rehab RN, Verdis Frederickson. Patient reported some mild lightheadedness/dizziness while doing warm up stretches. BP was checked and was 122/70 when sitting and 104/72  with standing. He was was given a couple of bottles of water and BP improved with standing and symptoms ultimately resolved. Verdis Frederickson said he is having occasional PVCs which is not unusual for him. Patient stated he had not had much to drink today prior to coming to rehab. Patient has a BP cuff at home. Advised him to keep an eye on his BP at home and notify us if he has any recurrent symptoms. Also encouraged him to make sure he is staying hydrated. Would continue current medications for now. OK to continue with Cardiac Rehab.  Darreld Mclean, [name] 09/20/2021 3:56 PM

## 2021-09-21 ENCOUNTER — Other Ambulatory Visit (HOSPITAL_COMMUNITY): Payer: Self-pay

## 2021-09-21 MED ORDER — ACITRETIN 25 MG PO CAPS
50.0000 mg | ORAL_CAPSULE | Freq: Every day | ORAL | 0 refills | Status: AC
  Filled 2021-09-21: qty 60, 30d supply, fill #0

## 2021-09-21 MED ORDER — PIMECROLIMUS 1 % EX CREA
TOPICAL_CREAM | CUTANEOUS | 0 refills | Status: AC
Start: 2021-09-20 — End: ?
  Filled 2021-09-21: qty 60, 30d supply, fill #0

## 2021-09-23 ENCOUNTER — Other Ambulatory Visit: Payer: Self-pay

## 2021-09-23 ENCOUNTER — Other Ambulatory Visit: Payer: Self-pay | Admitting: Internal Medicine

## 2021-09-23 ENCOUNTER — Encounter (HOSPITAL_COMMUNITY)
Admission: RE | Admit: 2021-09-23 | Discharge: 2021-09-23 | Disposition: A | Discharge status: Home / Self Care | Payer: Medicare Other | Source: Ambulatory Visit | Attending: Cardiology | Admitting: Cardiology

## 2021-09-23 ENCOUNTER — Telehealth: Payer: Self-pay | Admitting: Cardiology

## 2021-09-23 ENCOUNTER — Telehealth: Payer: Self-pay | Admitting: Internal Medicine

## 2021-09-23 ENCOUNTER — Other Ambulatory Visit: Payer: Self-pay | Admitting: Endocrinology

## 2021-09-23 ENCOUNTER — Other Ambulatory Visit (HOSPITAL_COMMUNITY): Payer: Self-pay

## 2021-09-23 DIAGNOSIS — G4733 Obstructive sleep apnea (adult) (pediatric): Secondary | ICD-10-CM

## 2021-09-23 DIAGNOSIS — Z955 Presence of coronary angioplasty implant and graft: Secondary | ICD-10-CM

## 2021-09-23 DIAGNOSIS — Z794 Long term (current) use of insulin: Secondary | ICD-10-CM

## 2021-09-23 DIAGNOSIS — E1159 Type 2 diabetes mellitus with other circulatory complications: Secondary | ICD-10-CM

## 2021-09-23 MED ORDER — UNIFINE PENTIPS 32G X 4 MM MISC
Freq: Every day | 0 refills | Status: AC
  Filled 2021-09-23: qty 100, 100d supply, fill #0

## 2021-09-23 NOTE — Progress Notes (Signed)
Blood pressure 83/52 post exercise today at cardiac rehab. Heart rate 75. Recheck BP 104/68 sitting. Standing blood pressure 88/56. Asymptomatic. Marco [name] was given water. Recheck bP 103/70 standing. Marco Kicks NP paged and notified. Marco [name] spoke with [name] over the phone and asked him to hold his amlodipine. Marco [name] weight today is 97.2 kg which is down 3.1 kg since starting cardiac rehab. Marco [name] left cardiac rehab without complaints or symptoms.Will fax exercise flow sheets to Dr. Theodosia Blender office for review.Barnet Pall, RN,BSN 09/23/2021 4:36 PM

## 2021-09-23 NOTE — Telephone Encounter (Signed)
Patient states he cannot wear the mask for the CPAP machine. He has tried 2 different masks and they are too tight on his nose. Call back number is [phone]   Please advise on what patient can do.

## 2021-09-23 NOTE — Telephone Encounter (Signed)
Moved appointment up with [name]. Patient verbalized understanding with read back of appt date and time.

## 2021-09-23 NOTE — Telephone Encounter (Signed)
Cardiac rehab called, pt's BP has been down to 83/52 with water and exercise up to 104/68 then back to 88/56.  His lasix is 80 mg in am and 40 in pm.  Asymptomatic.    Pt will stop amlodipine and we will see in office for follow up. Ok to continue rehab.    Triage please have pt to be seen by Dr [name] or APP this week or next.  He has appt with [name] in March but needs to be seen before then.  Thanks. Marco [name]

## 2021-09-24 ENCOUNTER — Ambulatory Visit (INDEPENDENT_AMBULATORY_CARE_PROVIDER_SITE_OTHER): Payer: Medicare Other | Admitting: Endocrinology

## 2021-09-24 ENCOUNTER — Ambulatory Visit: Payer: Medicare Other

## 2021-09-24 ENCOUNTER — Other Ambulatory Visit (HOSPITAL_COMMUNITY): Payer: Self-pay

## 2021-09-24 ENCOUNTER — Encounter: Payer: Self-pay | Admitting: Endocrinology

## 2021-09-24 VITALS — BP 164/78 | HR 80 | Ht 65.25 in | Wt 212.0 lb

## 2021-09-24 DIAGNOSIS — L309 Dermatitis, unspecified: Secondary | ICD-10-CM | POA: Diagnosis not present

## 2021-09-24 DIAGNOSIS — Z794 Long term (current) use of insulin: Secondary | ICD-10-CM

## 2021-09-24 DIAGNOSIS — Z87442 Personal history of urinary calculi: Secondary | ICD-10-CM | POA: Diagnosis not present

## 2021-09-24 DIAGNOSIS — M6281 Muscle weakness (generalized): Secondary | ICD-10-CM

## 2021-09-24 DIAGNOSIS — R279 Unspecified lack of coordination: Secondary | ICD-10-CM | POA: Diagnosis not present

## 2021-09-24 DIAGNOSIS — M62838 Other muscle spasm: Secondary | ICD-10-CM | POA: Diagnosis not present

## 2021-09-24 DIAGNOSIS — E1142 Type 2 diabetes mellitus with diabetic polyneuropathy: Secondary | ICD-10-CM

## 2021-09-24 DIAGNOSIS — E118 Type 2 diabetes mellitus with unspecified complications: Secondary | ICD-10-CM

## 2021-09-24 DIAGNOSIS — R300 Dysuria: Secondary | ICD-10-CM | POA: Diagnosis not present

## 2021-09-24 LAB — POCT GLYCOSYLATED HEMOGLOBIN (HGB A1C): Hemoglobin A1C: 6.6 % — AB (ref 4.0–5.6)

## 2021-09-24 MED ORDER — TIRZEPATIDE 7.5 MG/0.5ML ~~LOC~~ SOAJ
7.5000 mg | SUBCUTANEOUS | 3 refills | Status: AC
  Filled 2021-09-24 – 2021-09-25 (×2): qty 6, 84d supply, fill #0

## 2021-09-24 MED ORDER — AMLODIPINE BESYLATE 5 MG PO TABS
5.0000 mg | ORAL_TABLET | Freq: Every day | ORAL | 1 refills | Status: AC
  Filled 2021-09-24: qty 90, 90d supply, fill #0

## 2021-09-24 MED ORDER — HUMALOG MIX 50/50 KWIKPEN (50-50) 100 UNIT/ML ~~LOC~~ SUPN
60.0000 [IU] | PEN_INJECTOR | Freq: Every day | SUBCUTANEOUS | 3 refills | Status: AC
  Filled 2021-09-24: qty 60, 100d supply, fill #0

## 2021-09-24 NOTE — Telephone Encounter (Signed)
Requested Prescriptions  Pending Prescriptions Disp Refills   amLODipine (NORVASC) 5 MG tablet 90 tablet 1    Sig: Take 1 tablet (5 mg total) by mouth daily.     Cardiovascular: Calcium Channel Blockers 2 Passed - 09/23/2021  1:25 PM      Passed - Last BP in normal range    BP Readings from Last 1 Encounters:  08/13/21 137/78         Passed - Last Heart Rate in normal range    Pulse Readings from Last 1 Encounters:  08/13/21 79         Passed - Valid encounter within last 6 months    Recent Outpatient Visits          1 month ago Type 2 diabetes mellitus with polyneuropathy [facility])   [place] Karle Plumber B, MD   5 months ago Type 2 diabetes mellitus with peripheral neuropathy [facility])   [place], MD   5 months ago On home oxygen therapy   [place], MD   9 months ago Chronic congestion of paranasal sinus   [place], MD   10 months ago Diabetic polyneuropathy associated with type 2 diabetes mellitus ([place])   [place], [place], NP      Future Appointments            In 2 weeks Turner, Eber Hong, MD [place]   In 2 months Wynetta Emery, Dalbert Batman, MD [place]   In 4 months Young, Kasandra Knudsen, MD [facility]

## 2021-09-24 NOTE — Patient Instructions (Addendum)
check your blood sugar twice a day.  vary the time of day when you check, between before the 3 meals, and at bedtime.  also check if you have symptoms of your blood sugar being too high or too low.  please keep a record of the readings and bring it to your next appointment here (or you can bring the meter itself).  You can write it on any piece of paper.  please call us sooner if your blood sugar goes below 70, or if most of your readings are over 200.   Please reduce the insulin to 60 units with breakfast.  I have sent a prescription to your pharmacy, to increase the [facility] again.   On this type of insulin schedule, you should eat meals on a regular schedule (especially lunch).  If a meal is missed or significantly delayed, your blood sugar could go low.   Please come back for a follow-up appointment in 2 months.

## 2021-09-24 NOTE — Therapy (Signed)
[facility] @ [address] [place], [place], [address] Phone: [phone]   Fax:  [phone]  Physical Therapy Treatment  Patient Details  Name: Marco [name] MRN: [id number] Date of Birth: 01/03/1956 Referring Provider (PT): Robley Fries, MD   Encounter Date: 09/24/2021   PT End of Session - 09/24/21 1149     Visit Number 2    Date for PT Re-Evaluation 11/26/21    Authorization Type [employer] Medicare    Progress Note Due on Visit 10    PT Start Time 1145    PT Stop Time 1223    PT Time Calculation (min) 38 min    Activity Tolerance Patient tolerated treatment well    Behavior During Therapy [facility] for tasks assessed/performed             Past Medical History:  Diagnosis Date   Benign colon polyp 08/01/2013   [facility] in [place]. large base tranverse colon polyp was biopsied. polyp was benign with minimal surface hyperplastic change.   Chronic combined systolic and diastolic CHF (congestive heart failure) (HCC)    Chronic pain    CKD (chronic kidney disease), stage III (HCC)    Diabetes mellitus without complication ([place])    Diverticulosis    Esophageal hiatal hernia 07/29/2013   confirmed on EGD    Esophageal stricture    Essential hypertension    Gastritis 07/29/2013   confirmed on EGD, bx done an negative for intestinal metaplasia, dsyplasia or H. pylori. normal gastric emptying study done 07/13/2013.   GERD (gastroesophageal reflux disease)    Morbid obesity (HCC)    Non-obstructive CAD    Coronary CTA 04/2021 showed coronary calcium score of 535 which is 93rd % for age and sex matched controls.  There is mild CAD with mild calcified plaque in the distal LAD 25-49% and also in the ostial D3.  There is minimal calcified plaque in the mid LCx and mild mixed plaque in the pRCA with 25-49% stenosis.   Persistent atrial fibrillation ([place])    a. 02/2013 s/p rfca in [place], [place]-->prev on Xarelto,  d/c'd 2/2 anemia, ? GIB.   Rheumatoid arthritis ([place])    Sigmoid diverticulosis 08/01/2013   confirmed on colonscopy. record scanned into chart    Past Surgical History:  Procedure Laterality Date   CARDIAC CATHETERIZATION  05/2014   ablation for atrial fibrillation   CARDIAC CATHETERIZATION N/A 11/16/2015   Procedure: Left Heart Cath and Coronary Angiography;  Surgeon: Leonie Man, MD;  Location: [place] [facility];  Service: Cardiovascular;  Laterality: N/A;   COLON RESECTION  09/2013   due to large, abnormal polpy. non cancerous per patient.    COLON SURGERY  09/2014   colon resection    CORONARY STENT INTERVENTION N/A 06/11/2021   Procedure: CORONARY STENT INTERVENTION;  Surgeon: Jettie Booze, MD;  Location: [place] [facility];  Service: Cardiovascular;  Laterality: N/A;   EXTRACORPOREAL SHOCK WAVE LITHOTRIPSY Left 01/03/2021   Procedure: EXTRACORPOREAL SHOCK WAVE LITHOTRIPSY (ESWL);  Surgeon: Robley Fries, MD;  Location: [facility];  Service: Urology;  Laterality: Left;   IR PERC PLEURAL DRAIN W/INDWELL CATH W/IMG GUIDE  01/07/2020   LEFT HEART CATH AND CORONARY ANGIOGRAPHY N/A 05/19/2018   Procedure: LEFT HEART CATH AND CORONARY ANGIOGRAPHY;  Surgeon: Wellington Hampshire, MD;  Location: [place] [facility];  Service: Cardiovascular;  Laterality: N/A;   LEFT HEART CATH AND CORONARY ANGIOGRAPHY  N/A 06/11/2021   Procedure: LEFT HEART CATH AND CORONARY ANGIOGRAPHY;  Surgeon: Jettie Booze, MD;  Location: [place] [facility];  Service: Cardiovascular;  Laterality: N/A;    There were no vitals filed for this visit.   Subjective Assessment - 09/24/21 1146     Subjective Pt denies any changes since last visit. He reports feeling a little achy today. He has been working on Designer, fashion/clothing intake.    Patient Stated Goals To decrease pain with urination    Currently in Pain? No/denies    Multiple Pain Sites No                                [facility] Adult PT Treatment/Exercise - 09/24/21 0001       Exercises   Exercises Lumbar      Lumbar Exercises: Stretches   Lower Trunk Rotation Limitations 30x    Piriformis Stretch Right;Left;1 rep;60 seconds    Other Lumbar Stretch Exercise Hip flexor stretch (modified thomas) - required support for Lt LE due to pain and inability to lower completely down to table - 2 x 45 seconds Bil      Lumbar Exercises: Supine   Bridge 20 reps   limited lift - avoided any increase in low back pain   Other Supine Lumbar Exercises Bent knee fall out 10x bil; supine march 2 x 10; hip adduction isometric 10 x 5 sec      Lumbar Exercises: Sidelying   Clam Right;Left;20 reps      Manual Therapy   Manual Therapy Soft tissue mobilization    Soft tissue mobilization abdominal soft tissue mobilization with focus over bladder to decrease restriction                     PT Education - 09/24/21 1208     Education Details Pt education performed on importance of performing HEP to help mobility and strengthening; we disucssed how it may help arthritis pain as well.    Person(s) Educated Patient    Methods Explanation;Demonstration;Tactile cues;Verbal cues    Comprehension Verbalized understanding              PT Short Term Goals - 09/17/21 1236       PT SHORT TERM GOAL #1   Title Pt will be independent with HEP.    Time 4    Period Weeks    Status New    Target Date 10/15/21      PT SHORT TERM GOAL #2   Title Pt will improve all lumbopelvic A/ROM by 25% in order to help improve abdominal/pelvic floor muscle mobility and circulation.    Time 4    Period Weeks    Status New    Target Date 10/15/21      PT SHORT TERM GOAL #3   Title Pt will increase water intake to 4 8oz glasses a day to help decrease pain with urination.    Time 4    Period Weeks    Status New    Target Date 10/15/21               PT Long Term Goals - 09/17/21  1238       PT LONG TERM GOAL #1   Title Pt will be independent with advanced HEP.    Time 10    Period Weeks    Status New    Target Date  12/10/21      PT LONG TERM GOAL #2   Title Pt will demonstrate increase in all impaired hip strength by 1 muscle grade in order to demonstrate improved lumbopelvic support and increase functional ability.    Time 10    Period Weeks    Status New    Target Date 11/26/21      PT LONG TERM GOAL #3   Title Pt will demonstrate normal pelvic floor muscle tone and A/ROM, able to achieve 4/5 strength with contractions and 10 sec endurance, in order to provide appropriate lumbopelvic support in functional activities.    Time 10    Period Weeks    Status New    Target Date 11/26/21      PT LONG TERM GOAL #4   Title Pt will report less than 2/10 with urination.    Time 10    Period Weeks    Status New    Target Date 11/26/21                   Plan - 09/24/21 1209     Clinical Impression Statement Manual techniques performed to lower abdomen to help improve bladder mobility, decreasing pain with urination. We reviewed previous mobility/strengthenign exercises due to little adherence with HEP at home. Mild progressions added, inlcuidng bridge and modified thomas stretch for hip flexors; he had pain when pushing hiehgt of bridge and was encouraged to only perofrm in pain free range; hip flexor stretch on left required support due to not being able to reach neutral, but Rt he was able to reach table and feel good stretch. He was encouraged to perform HEP more regularly and start to notice if he is seeing any changes in dysuria. He will continue to benefit from skilled PT intervention in order to decrease dysurea and improve QOL.    PT Treatment/Interventions ADLs/Self Care Home Management;Biofeedback;Electrical Stimulation;Cryotherapy;Moist Heat;Therapeutic activities;Therapeutic exercise;Neuromuscular re-education;Manual techniques;Patient/family  education;Scar mobilization;Passive range of motion;Dry needling;Spinal Manipulations    PT Next Visit Plan Plan to perform abdominal manual techniques to improve mobility and tenderness; progress mobility and gentle strengthening exercises; diaphragmatic breathing; push water intake.    PT Home Exercise Plan [id number]    Consulted and Agree with Plan of Care Patient             Patient will benefit from skilled therapeutic intervention in order to improve the following deficits and impairments:  Abnormal gait, Increased fascial restricitons, Impaired tone, Decreased endurance, Pain, Decreased activity tolerance, Decreased scar mobility, Hypomobility, Decreased mobility, Decreased strength, Decreased range of motion, Decreased coordination  Visit Diagnosis: Muscle weakness (generalized)  Other muscle spasm  Unspecified lack of coordination     Problem List Patient Active Problem List   Diagnosis Date Noted   Immunodeficiency, unspecified ([place]) 08/13/2021   Abnormal stress test    Cellulitis of finger of left hand 10/20/2020   Nasal congestion 10/20/2020   History of Clostridium difficile infection 10/20/2020   Cough 10/14/2020   Healthcare maintenance 06/15/2020   Perinephric abscess 05/13/2020   Pyohydronephrosis 05/13/2020   Vitamin B12 deficiency 02/19/2020   Elevated LFTs    Pleural effusion    Weakness    Loculated pleural effusion    Complex renal cyst    Urethral stricture 12/30/2019   Nephrolithiasis 12/30/2019   Acute febrile illness 12/30/2019   Sepsis ([place]) 12/27/2019   Severe sepsis ([place]) 08/13/7251   Acute metabolic encephalopathy 66/44/0347   Sepsis due to Gram negative bacteria ([place]) 12/09/2019  Rhinitis 10/16/2019   Gastroesophageal reflux disease without esophagitis 08/31/2018   Muscle spasm 08/31/2018   Cardiac amyloidosis (HCC)    Hypokalemia    Acute renal failure superimposed on stage 3 chronic kidney disease ([place]) 08/27/2017   Hyperlipidemia  12/23/2016   Chronic combined systolic and diastolic CHF (congestive heart failure) ([place]) 09/09/2016   Diabetic polyneuropathy associated with type 2 diabetes mellitus ([place]) 06/10/2016   Vision changes 06/02/2016   Myalgia and myositis 03/31/2016   Shortness of breath 02/09/2016   Chronic gouty arthritis 11/29/2015   LBP (low back pain) 11/27/2015   Arthralgia of multiple joints 11/27/2015   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[place]) 11/27/2015   Chest pain of uncertain etiology 16/05/9603   Sinusitis, chronic 10/16/2015   Chronic insomnia 10/16/2015   New onset of headaches after age 45 09/20/2015   OSA (obstructive sleep apnea) 07/12/2015   Low serum testosterone level 05/18/2015   Depression 05/16/2015   Morbid obesity due to excess calories (HCC)    Chronic pain    Type 2 diabetes mellitus with complication, with long-term current use of insulin (HCC)    Rheumatoid arthritis ([place]) 02/05/2015   Elevated troponin 12/24/2014   CAD (coronary artery disease) 12/24/2014   Atrial fibrillation ([place]) 12/24/2014   Hypertension 12/24/2014   Chronic kidney disease 12/24/2014   PUD (peptic ulcer disease) 12/24/2014    Heather Roberts, PT, DPT02/14/2312:31 PM   [facility] @ [address] [place], [place], [address] Phone: [phone]   Fax:  [phone]  Name: Marco [name] MRN: [id number] Date of Birth: [birth year]

## 2021-09-24 NOTE — Progress Notes (Signed)
Subjective:    Patient ID: Marco [name], male    DOB: 1955/09/20, 66 y.o.   MRN: [id number]  HPI Pt returns for f/u of diabetes mellitus: DM type: Insulin-requiring type 2 Dx'ed: 4098 Complications: PN, CAD, NPDR, and stage 3a CRI.  Therapy: insulin since soon after dx, and Mounjaro.   DKA: never Severe hypoglycemia: once (2020) Pancreatitis: never.  Pancreatic imaging: never.  Other: he intermittently takes prednisone, for psoriasis; he declined to continue multiple daily injections; due to renal failure (and fasting hypoglycemia), he takes 50/50 insulin.   Interval history: I reviewed continuous glucose monitor data.  glucose varies from 110-220.  It highest 3AM, 1PM., and 11PM  It decreases 3AM-9AM.  pt says he never misses the insulin.  No recent steroids.  Past Medical History:  Diagnosis Date   Benign colon polyp 08/01/2013   [facility] in [place]. large base tranverse colon polyp was biopsied. polyp was benign with minimal surface hyperplastic change.   Chronic combined systolic and diastolic CHF (congestive heart failure) (HCC)    Chronic pain    CKD (chronic kidney disease), stage III (HCC)    Diabetes mellitus without complication ([place])    Diverticulosis    Esophageal hiatal hernia 07/29/2013   confirmed on EGD    Esophageal stricture    Essential hypertension    Gastritis 07/29/2013   confirmed on EGD, bx done an negative for intestinal metaplasia, dsyplasia or H. pylori. normal gastric emptying study done 07/13/2013.   GERD (gastroesophageal reflux disease)    Morbid obesity (HCC)    Non-obstructive CAD    Coronary CTA 04/2021 showed coronary calcium score of 535 which is 93rd % for age and sex matched controls.  There is mild CAD with mild calcified plaque in the distal LAD 25-49% and also in the ostial D3.  There is minimal calcified plaque in the mid LCx and mild mixed plaque in the pRCA with 25-49% stenosis.   Persistent atrial fibrillation ([place])     a. 02/2013 s/p rfca in [place], [place]-->prev on Xarelto, d/c'd 2/2 anemia, ? GIB.   Rheumatoid arthritis ([place])    Sigmoid diverticulosis 08/01/2013   confirmed on colonscopy. record scanned into chart    Past Surgical History:  Procedure Laterality Date   CARDIAC CATHETERIZATION  05/2014   ablation for atrial fibrillation   CARDIAC CATHETERIZATION N/A 11/16/2015   Procedure: Left Heart Cath and Coronary Angiography;  Surgeon: Leonie Man, MD;  Location: [facility];  Service: Cardiovascular;  Laterality: N/A;   COLON RESECTION  09/2013   due to large, abnormal polpy. non cancerous per patient.    COLON SURGERY  09/2014   colon resection    CORONARY STENT INTERVENTION N/A 06/11/2021   Procedure: CORONARY STENT INTERVENTION;  Surgeon: Jettie Booze, MD;  Location: [facility];  Service: Cardiovascular;  Laterality: N/A;   EXTRACORPOREAL SHOCK WAVE LITHOTRIPSY Left 01/03/2021   Procedure: EXTRACORPOREAL SHOCK WAVE LITHOTRIPSY (ESWL);  Surgeon: Robley Fries, MD;  Location: [facility];  Service: Urology;  Laterality: Left;   IR PERC PLEURAL DRAIN W/INDWELL CATH W/IMG GUIDE  01/07/2020   LEFT HEART CATH AND CORONARY ANGIOGRAPHY N/A 05/19/2018   Procedure: LEFT HEART CATH AND CORONARY ANGIOGRAPHY;  Surgeon: Wellington Hampshire, MD;  Location: [facility];  Service: Cardiovascular;  Laterality: N/A;   LEFT HEART CATH AND CORONARY ANGIOGRAPHY N/A 06/11/2021   Procedure: LEFT HEART CATH AND CORONARY ANGIOGRAPHY;  Surgeon:  Jettie Booze, MD;  Location: [facility];  Service: Cardiovascular;  Laterality: N/A;    Social History   Socioeconomic History   Marital status: Married    Spouse name: Amethyst   Number of children: 8   Years of education: 13   Highest education level: Some college, no degree  Occupational History    Comment: disabled  Tobacco Use   Smoking status: Former    Packs/day: 0.50    Years: 10.00    Pack years:  5.00    Types: Cigarettes    Quit date: 04/11/2008    Years since quitting: 13.4   Smokeless tobacco: Never  Vaping Use   Vaping Use: Never used  Substance and Sexual Activity   Alcohol use: No    Alcohol/week: 0.0 standard drinks   Drug use: No   Sexual activity: Yes    Partners: Female    Birth control/protection: None  Other Topics Concern   Not on file  Social History Narrative   Lives alone Does not work.  On disability.    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 Provided crisis assistance program information to assist with bills       Has issues getting food- gets $36/month in food stamps- already has list of food pantries but has not tried them- encouraged pt to try food pantries and reach out to clinic for help if needed   Social Determinants of Health   Financial Resource Strain: Low Risk    Difficulty of Paying Living Expenses: Not hard at all  Food Insecurity: No Food Insecurity   Worried About Charity fundraiser in the Last Year: Never true   [place] in the Last Year: Never true  Transportation Needs: No Transportation Needs   Lack of Transportation (Medical): No   Lack of Transportation (Non-Medical): No  Physical Activity: Inactive   Days of Exercise per Week: 0 days   Minutes of Exercise per Session: 0 min  Stress: No Stress Concern Present   Feeling of Stress : Not at all  Social Connections: Moderately Isolated   Frequency of Communication with Friends and Family: Once a week   Frequency of Social Gatherings with Friends and Family: Never   Attends Religious Services: More than 4 times per year   Active Member of [employer] or Organizations: Yes   Attends Music therapist: More than 4 times per year   Marital Status: Separated  Intimate Partner Violence: Not At  Risk   Fear of Current or Ex-Partner: No   Emotionally Abused: No   Physically Abused: No   Sexually Abused: No    Current Outpatient Medications on File Prior to Visit  Medication Sig Dispense Refill   Accu-Chek Softclix Lancets lancets USE AS INSTRUCTED 100 each 12   acetaminophen (TYLENOL) 650 MG CR tablet Take 1,300 mg by mouth in the morning and at bedtime.     acitretin (SORIATANE) 25 MG capsule Take 2 capsules (50 mg total) by mouth daily. 60 capsule 0   albuterol (VENTOLIN HFA) 108 (90 Base) MCG/ACT inhaler INHALE 2 PUFFS INTO THE LUNGS EVERY 6 HOURS AS NEEDED FOR WHEEZING OR SHORTNESS OF BREATH. 8.5 g 1   allopurinol (ZYLOPRIM) 100 MG tablet Take 2 tablets by mouth  daily. 60 tablet 0   amLODipine (NORVASC) 5 MG tablet Take 1 tablet (5 mg total) by mouth daily. 90 tablet 1   aspirin (EQ ASPIRIN ADULT LOW DOSE) 81 MG EC tablet Take 1 tablet (81 mg total) by mouth daily. TAKE 1 TABLET BY MOUTH ONCE DAILY. SWALLOW WHOLE 30 tablet 12   atorvastatin (LIPITOR) 40 MG tablet Take 1 tablet (40 mg total) by mouth daily. 90 tablet 3   carvedilol (COREG) 12.5 MG tablet TAKE 1 TABLET BY MOUTH TWO TIMES DAILY WITH MEALS 180 tablet 1   clopidogrel (PLAVIX) 75 MG tablet Take 1 tablet (75 mg total) by mouth daily. 30 tablet 5   Continuous Blood Gluc Receiver (DEXCOM G6 RECEIVER) DEVI Use as instructed to monitor blood sugar daily 1 each 0   Continuous Blood Gluc Sensor (DEXCOM G6 SENSOR) MISC Use as instructed to monitor blood sugar daily. Change every 10 days. 9 each 3   Continuous Blood Gluc Transmit (DEXCOM G6 TRANSMITTER) MISC Use as instructed to monitor blood sugar daily. Change every 90 days. 1 each 3   Crisaborole (EUCRISA) 2 % OINT Apply to affected area twice a day 60 g 0   diclofenac Sodium (VOLTAREN) 1 % GEL Apply 2 g topically 4 times daily as needed. 100 g 1   DULoxetine (CYMBALTA) 20 MG capsule TAKE 1 CAPSULE BY MOUTH ONCE A DAY 90 capsule 1   famotidine (PEPCID) 20 MG tablet TAKE 1  TABLET BY MOUTH 2 TIMES DAILY 60 tablet 2   fexofenadine (ALLEGRA) 180 MG tablet Take 180 mg by mouth every evening.     fluticasone (FLONASE) 50 MCG/ACT nasal spray PLACE 1 SPRAY INTO BOTH NOSTRILS DAILY AS NEEDED FOR ALLERGIES OR RHINITIS. 16 g 1   furosemide (LASIX) 40 MG tablet Take 1 tablet  by mouth 2 times daily. 60 tablet 11   gabapentin (NEURONTIN) 300 MG capsule Take 1 capsule (300 mg total) by mouth 2 (two) times daily. 60 capsule 5   Insulin Pen Needle (UNIFINE PENTIPS) 32G X 4 MM MISC USE AS DIRECTED DAILY 100 each 0   isosorbide mononitrate (IMDUR) 60 MG 24 hr tablet Take 1.5 tablets (90 mg total) by mouth daily. 135 tablet 3   leflunomide (ARAVA) 10 MG tablet Take 1 tablet by mouth daily. 30 tablet 5   Melatonin 10 MG TABS Take 10 mg by mouth at bedtime.     naloxone (NARCAN) nasal spray 4 mg/0.1 mL Place in nostril in the event of suspected overdose on Narcotic medication (Oxycodone) 1 each 0   nitroGLYCERIN (NITROSTAT) 0.4 MG SL tablet Place 1 tablet (0.4 mg total) under the tongue every 5 (five) minutes as needed for chest pain. 25 tablet 2   oxyCODONE-acetaminophen (PERCOCET/ROXICET) 5-325 MG tablet Take 1 tablet by mouth 4 (four) times daily.     oxymetazoline (AFRIN) 0.05 % nasal spray Place 1 spray into both nostrils 2 times daily. 30 mL 0   pimecrolimus (ELIDEL) 1 % cream Apply to Left 1st-3rd fingers daily as directed 60 g 0   potassium chloride SA (KLOR-CON M) 20 MEQ tablet Take 1 tablet by mouth daily. 30 tablet 11   Secukinumab (COSENTYX SENSOREADY PEN) 150 MG/ML SOAJ INJECT ONE PEN SUBCUTANEOUSLY EVERY 4 WEEKS. REFRIGERATE. ALLOW 15 TO 30 MINUTES AT ROOM TEMP PRIOR TO ADMINISTRATION. 3 mL 1   Tafamidis 61 MG CAPS TAKE 1 CAPSULE (61 MG) BY MOUTH DAILY. 30 capsule 11   tamsulosin (FLOMAX) 0.4 MG CAPS capsule Take 1 capsule (  0.4 mg total) by mouth at bedtime. 90 capsule 0   triamcinolone cream (KENALOG) 0.1 % APPLY TO THE AFFECTED AREA(S) TOPICALLY 2 TIMES DAILY AS DIRECTED  454 g 0   UNIFINE PENTIPS 32G X 4 MM MISC USE AS DIRECTED DAILY 100 each 0   glucose blood test strip USE TO TEST 4 TIMES DAILY. 400 strip 3   [DISCONTINUED] hydrALAZINE (APRESOLINE) 25 MG tablet Take 1 tablet (25 mg total) by mouth 3 (three) times daily. 90 tablet 3   [DISCONTINUED] metoprolol tartrate (LOPRESSOR) 100 MG tablet Take 1 tablet (100 mg total) by mouth once for 1 dose. 1 tablet 0   [DISCONTINUED] ramelteon (ROZEREM) 8 MG tablet TAKE 1 TABLET BY MOUTH AT BEDTIME 30 tablet 1   No current facility-administered medications on file prior to visit.    Allergies  Allergen Reactions   Nsaids Other (See Comments)    Stomach pains. Ulcers     Jardiance [Empagliflozin] Nausea And Vomiting    Family History  Problem Relation Age of Onset   Hypertension Mother    Diabetes Mother    COPD Mother    Lung cancer Mother        Smoker    Hypertension Father    Diabetes Father    Heart Problems Father    COPD Father    Colon cancer Neg Hx    Stomach cancer Neg Hx     BP (!) 164/78 (BP Location: Left Arm, Patient Position: Sitting, Cuff Size: Normal)    Pulse 80    Ht 5' 5.25" (1.657 m)    Wt 212 lb (96.2 kg)    SpO2 95%    BMI 35.01 kg/m    Review of Systems He seldom has hypoglycemia, and these episodes are mild.  He denies N/V/HB    Objective:   Physical Exam  Lab Results  Component Value Date   CREATININE 1.41 (H) 08/28/2021   BUN 14 08/28/2021   NA 145 (H) 08/28/2021   K 4.1 08/28/2021   CL 105 08/28/2021   CO2 21 08/28/2021    Lab Results  Component Value Date   HGBA1C 6.6 (A) 09/24/2021      Assessment & Plan:  Insulin-requiring type 2 DM: overcontrolled   Patient Instructions  check your blood sugar twice a day.  vary the time of day when you check, between before the 3 meals, and at bedtime.  also check if you have symptoms of your blood sugar being too high or too low.  please keep a record of the readings and bring it to your next appointment here (or  you can bring the meter itself).  You can write it on any piece of paper.  please call us sooner if your blood sugar goes below 70, or if most of your readings are over 200.   Please reduce the insulin to 60 units with breakfast.  I have sent a prescription to your pharmacy, to increase the [facility] again.   On this type of insulin schedule, you should eat meals on a regular schedule (especially lunch).  If a meal is missed or significantly delayed, your blood sugar could go low.   Please come back for a follow-up appointment in 2 months.

## 2021-09-25 ENCOUNTER — Other Ambulatory Visit: Payer: Self-pay

## 2021-09-25 ENCOUNTER — Encounter (HOSPITAL_COMMUNITY)
Admission: RE | Admit: 2021-09-25 | Discharge: 2021-09-25 | Disposition: A | Discharge status: Home / Self Care | Payer: Medicare Other | Source: Ambulatory Visit | Attending: Cardiology | Admitting: Cardiology

## 2021-09-25 ENCOUNTER — Other Ambulatory Visit (HOSPITAL_COMMUNITY): Payer: Self-pay

## 2021-09-25 DIAGNOSIS — Z955 Presence of coronary angioplasty implant and graft: Secondary | ICD-10-CM

## 2021-09-25 NOTE — Telephone Encounter (Signed)
Please refer to sleep center for mask fitting/ desensitization

## 2021-09-25 NOTE — Telephone Encounter (Signed)
Patient states he cannot wear the mask for the CPAP machine. He has tried 2 different masks and they are too tight on his nose. Call back number is [phone]    Please advise on what patient can do.

## 2021-09-25 NOTE — Telephone Encounter (Signed)
Called and spoke with patient who states that he is not able to wear the CPAP machine due to the mask hurting the bridge of his nose. He states that he has gone through several masks and also done a mask fitting and everything still is very uncomfortable. He states that he reached out to a place called [facility] at [phone] and was told that he is a candidate for an oral appliance. Not sure if it would be easier for him to see someone local for an oral appliance. But would like to go this route. States that he has been without the CPAP for sometime now.   Dr. Annamaria Boots please advise

## 2021-09-25 NOTE — Progress Notes (Signed)
Cardiac Individual Treatment Plan  Patient Details  Name: Marco [name] MRN: [id number] Date of Birth: 1955-09-21 Referring Provider:   Flowsheet Row CARDIAC REHAB PHASE II ORIENTATION from 08/01/2021 in [facility]  Referring Provider Dr. [name], MD       Initial Encounter Date:  [place] from 08/01/2021 in [facility]  Date 08/01/21       Visit Diagnosis: 06/11/21 S/P DES, PTCA LAD  Patient's Home Medications on Admission:  Current Outpatient Medications:    Accu-Chek Softclix Lancets lancets, USE AS INSTRUCTED, Disp: 100 each, Rfl: 12   acetaminophen (TYLENOL) 650 MG CR tablet, Take 1,300 mg by mouth in the morning and at bedtime., Disp: , Rfl:    acitretin (SORIATANE) 25 MG capsule, Take 2 capsules (50 mg total) by mouth daily., Disp: 60 capsule, Rfl: 0   albuterol (VENTOLIN HFA) 108 (90 Base) MCG/ACT inhaler, INHALE 2 PUFFS INTO THE LUNGS EVERY 6 HOURS AS NEEDED FOR WHEEZING OR SHORTNESS OF BREATH., Disp: 8.5 g, Rfl: 1   allopurinol (ZYLOPRIM) 100 MG tablet, Take 2 tablets by mouth daily., Disp: 60 tablet, Rfl: 0   amLODipine (NORVASC) 5 MG tablet, Take 1 tablet (5 mg total) by mouth daily., Disp: 90 tablet, Rfl: 1   aspirin (EQ ASPIRIN ADULT LOW DOSE) 81 MG EC tablet, Take 1 tablet (81 mg total) by mouth daily. TAKE 1 TABLET BY MOUTH ONCE DAILY. SWALLOW WHOLE, Disp: 30 tablet, Rfl: 12   atorvastatin (LIPITOR) 40 MG tablet, Take 1 tablet (40 mg total) by mouth daily., Disp: 90 tablet, Rfl: 3   carvedilol (COREG) 12.5 MG tablet, TAKE 1 TABLET BY MOUTH TWO TIMES DAILY WITH MEALS, Disp: 180 tablet, Rfl: 1   clopidogrel (PLAVIX) 75 MG tablet, Take 1 tablet (75 mg total) by mouth daily., Disp: 30 tablet, Rfl: 5   Continuous Blood Gluc Receiver (DEXCOM G6 RECEIVER) DEVI, Use as instructed to monitor blood sugar daily, Disp: 1 each, Rfl: 0   Continuous Blood Gluc Sensor (DEXCOM  G6 SENSOR) MISC, Use as instructed to monitor blood sugar daily. Change every 10 days., Disp: 9 each, Rfl: 3   Continuous Blood Gluc Transmit (DEXCOM G6 TRANSMITTER) MISC, Use as instructed to monitor blood sugar daily. Change every 90 days., Disp: 1 each, Rfl: 3   Crisaborole (EUCRISA) 2 % OINT, Apply to affected area twice a day, Disp: 60 g, Rfl: 0   diclofenac Sodium (VOLTAREN) 1 % GEL, Apply 2 g topically 4 times daily as needed., Disp: 100 g, Rfl: 1   DULoxetine (CYMBALTA) 20 MG capsule, TAKE 1 CAPSULE BY MOUTH ONCE A DAY, Disp: 90 capsule, Rfl: 1   famotidine (PEPCID) 20 MG tablet, TAKE 1 TABLET BY MOUTH 2 TIMES DAILY, Disp: 60 tablet, Rfl: 2   fexofenadine (ALLEGRA) 180 MG tablet, Take 180 mg by mouth every evening., Disp: , Rfl:    fluticasone (FLONASE) 50 MCG/ACT nasal spray, PLACE 1 SPRAY INTO BOTH NOSTRILS DAILY AS NEEDED FOR ALLERGIES OR RHINITIS., Disp: 16 g, Rfl: 1   furosemide (LASIX) 40 MG tablet, Take 1 tablet  by mouth 2 times daily., Disp: 60 tablet, Rfl: 11   gabapentin (NEURONTIN) 300 MG capsule, Take 1 capsule (300 mg total) by mouth 2 (two) times daily., Disp: 60 capsule, Rfl: 5   glucose blood test strip, USE TO TEST 4 TIMES DAILY., Disp: 400 strip, Rfl: 3   Insulin Lispro Prot & Lispro (HUMALOG MIX  50/50 KWIKPEN) (50-50) 100 UNIT/ML Kwikpen, Inject 60 Units into the skin daily with breakfast., Disp: 60 mL, Rfl: 3   Insulin Pen Needle (UNIFINE PENTIPS) 32G X 4 MM MISC, USE AS DIRECTED DAILY, Disp: 100 each, Rfl: 0   isosorbide mononitrate (IMDUR) 60 MG 24 hr tablet, Take 1.5 tablets (90 mg total) by mouth daily., Disp: 135 tablet, Rfl: 3   leflunomide (ARAVA) 10 MG tablet, Take 1 tablet by mouth daily., Disp: 30 tablet, Rfl: 5   Melatonin 10 MG TABS, Take 10 mg by mouth at bedtime., Disp: , Rfl:    naloxone (NARCAN) nasal spray 4 mg/0.1 mL, Place in nostril in the event of suspected overdose on Narcotic medication (Oxycodone), Disp: 1 each, Rfl: 0   nitroGLYCERIN (NITROSTAT)  0.4 MG SL tablet, Place 1 tablet (0.4 mg total) under the tongue every 5 (five) minutes as needed for chest pain., Disp: 25 tablet, Rfl: 2   oxyCODONE-acetaminophen (PERCOCET/ROXICET) 5-325 MG tablet, Take 1 tablet by mouth 4 (four) times daily., Disp: , Rfl:    oxymetazoline (AFRIN) 0.05 % nasal spray, Place 1 spray into both nostrils 2 times daily., Disp: 30 mL, Rfl: 0   pimecrolimus (ELIDEL) 1 % cream, Apply to Left 1st-3rd fingers daily as directed, Disp: 60 g, Rfl: 0   potassium chloride SA (KLOR-CON M) 20 MEQ tablet, Take 1 tablet by mouth daily., Disp: 30 tablet, Rfl: 11   Secukinumab (COSENTYX SENSOREADY PEN) 150 MG/ML SOAJ, INJECT ONE PEN SUBCUTANEOUSLY EVERY 4 WEEKS. REFRIGERATE. ALLOW 15 TO 30 MINUTES AT ROOM TEMP PRIOR TO ADMINISTRATION., Disp: 3 mL, Rfl: 1   Tafamidis 61 MG CAPS, TAKE 1 CAPSULE (61 MG) BY MOUTH DAILY., Disp: 30 capsule, Rfl: 11   tamsulosin (FLOMAX) 0.4 MG CAPS capsule, Take 1 capsule (0.4 mg total) by mouth at bedtime., Disp: 90 capsule, Rfl: 0   tirzepatide (MOUNJARO) 7.5 MG/0.5ML Pen, Inject 7.5 mg into the skin once a week., Disp: 6 mL, Rfl: 3   triamcinolone cream (KENALOG) 0.1 %, APPLY TO THE AFFECTED AREA(S) TOPICALLY 2 TIMES DAILY AS DIRECTED, Disp: 454 g, Rfl: 0   UNIFINE PENTIPS 32G X 4 MM MISC, USE AS DIRECTED DAILY, Disp: 100 each, Rfl: 0  Past Medical History: Past Medical History:  Diagnosis Date   Benign colon polyp 08/01/2013   [facility] in [place]. large base tranverse colon polyp was biopsied. polyp was benign with minimal surface hyperplastic change.   Chronic combined systolic and diastolic CHF (congestive heart failure) (HCC)    Chronic pain    CKD (chronic kidney disease), stage III (HCC)    Diabetes mellitus without complication ([place])    Diverticulosis    Esophageal hiatal hernia 07/29/2013   confirmed on EGD    Esophageal stricture    Essential hypertension    Gastritis 07/29/2013   confirmed on EGD, bx done an negative  for intestinal metaplasia, dsyplasia or H. pylori. normal gastric emptying study done 07/13/2013.   GERD (gastroesophageal reflux disease)    Morbid obesity (HCC)    Non-obstructive CAD    Coronary CTA 04/2021 showed coronary calcium score of 535 which is 93rd % for age and sex matched controls.  There is mild CAD with mild calcified plaque in the distal LAD 25-49% and also in the ostial D3.  There is minimal calcified plaque in the mid LCx and mild mixed plaque in the pRCA with 25-49% stenosis.   Persistent atrial fibrillation ([place])    a. 02/2013 s/p rfca in  [place], [place]-->prev on Xarelto, d/c'd 2/2 anemia, ? GIB.   Rheumatoid arthritis ([place])    Sigmoid diverticulosis 08/01/2013   confirmed on colonscopy. record scanned into chart    Tobacco Use: Social History   Tobacco Use  Smoking Status Former   Packs/day: 0.50   Years: 10.00   Pack years: 5.00   Types: Cigarettes   Quit date: 04/11/2008   Years since quitting: 13.4  Smokeless Tobacco Never    Labs: Recent Review Flowsheet Data     Labs for ITP Cardiac and Pulmonary Rehab Latest Ref Rng & Units 12/28/2020 01/11/2021 04/16/2021 07/23/2021 09/24/2021   Cholestrol 100 - 199 mg/dL - 97(L) - - -   LDLCALC 0 - 99 mg/dL - 41 - - -   HDL >39 mg/dL - 27(L) - - -   Trlycerides 0 - 149 mg/dL - 172(H) - - -   Hemoglobin A1c 4.0 - 5.6 % 7.9(A) - 9.1(A) 7.8(A) 6.6(A)   PHART 7.350 - 7.450 - - - - -   PCO2ART 32.0 - 48.0 mmHg - - - - -   HCO3 20.0 - 28.0 mmol/L - - - - -   TCO2 22 - 32 mmol/L - - - - -   ACIDBASEDEF 0.0 - 2.0 mmol/L - - - - -   O2SAT % - - - - -       Capillary Blood Glucose: Lab Results  Component Value Date   GLUCAP 105 (H) 09/09/2021   GLUCAP 116 (H) 08/14/2021   GLUCAP 103 (H) 08/14/2021   GLUCAP 154 (H) 08/07/2021   GLUCAP 143 (H) 08/07/2021     Exercise Target Goals: Exercise Program Goal: Individual exercise prescription set using results from initial 6 min walk test and THRR while considering   patients activity barriers and safety.   Exercise Prescription Goal: Starting with aerobic activity 30 plus minutes a day, 3 days per week for initial exercise prescription. Provide home exercise prescription and guidelines that participant acknowledges understanding prior to discharge.  Activity Barriers & Risk Stratification:  Activity Barriers & Cardiac Risk Stratification - 08/01/21 1441       Activity Barriers & Cardiac Risk Stratification   Activity Barriers Neck/Spine Problems;Arthritis;Back Problems;Joint Problems;Balance Concerns;Deconditioning;History of Falls;Assistive Device    Cardiac Risk Stratification High             6 Minute Walk:  6 Minute Walk     Row Name 08/01/21 1433         6 Minute Walk   Phase Initial     Distance 724 feet     Walk Time 6 minutes     # of Rest Breaks 1  1 rest break from 5:20-5:45     MPH 1.37     METS 1.41     RPE 11     Perceived Dyspnea  1     VO2 Peak 4.93     Symptoms Yes (comment)     Comments 6/10 right leg pain, rest break from 5:20-5:45     Resting HR 82 bpm     Resting BP 124/62     Resting Oxygen Saturation  98 %     Exercise Oxygen Saturation  during 6 min walk 97 %     Max Ex. HR 86 bpm     Max Ex. BP 124/62     2 Minute Post BP 113/69              Oxygen Initial Assessment:  Oxygen Re-Evaluation:   Oxygen Discharge (Final Oxygen Re-Evaluation):   Initial Exercise Prescription:  Initial Exercise Prescription - 08/01/21 1400       Date of Initial Exercise RX and Referring Provider   Date 08/01/21    Referring Provider Dr. [name], MD    Expected Discharge Date 09/27/20      NuStep   Level 1    SPM 65    Minutes 20    METs 1.5      Prescription Details   Frequency (times per week) 3    Duration Progress to 30 minutes of continuous aerobic without signs/symptoms of physical distress      Intensity   THRR 40-80% of Max Heartrate 62-123    Ratings of Perceived Exertion 11-13     Perceived Dyspnea 0-4      Progression   Progression Continue progressive overload as per policy without signs/symptoms or physical distress.      Resistance Training   Training Prescription Yes    Weight 2lbs    Reps 10-15             Perform Capillary Blood Glucose checks as needed.  Exercise Prescription Changes:   Exercise Prescription Changes     Row Name 08/07/21 1610 09/04/21 1640           Response to Exercise   Blood Pressure (Admit) 122/80 128/78      Blood Pressure (Exercise) 114/70 118/76      Blood Pressure (Exit) 104/58 118/80      Heart Rate (Admit) 78 bpm 82 bpm      Heart Rate (Exercise) 89 bpm 89 bpm      Heart Rate (Exit) 75 bpm 69 bpm      Rating of Perceived Exertion (Exercise) 11 12      Perceived Dyspnea (Exercise) 0 0      Symptoms 0 right knee pain and bilateral arm pain 8/10      Comments Pt first day in the CRP2 program Reviewed MET's goals and home ex rx      Duration Progress to 30 minutes of  aerobic without signs/symptoms of physical distress Progress to 30 minutes of  aerobic without signs/symptoms of physical distress      Intensity THRR unchanged THRR unchanged        Progression   Progression Continue to progress workloads to maintain intensity without signs/symptoms of physical distress. Continue to progress workloads to maintain intensity without signs/symptoms of physical distress.      Average METs 1.3 1.9        Resistance Training   Training Prescription No  No weight wednesday No  No weight wednesday        NuStep   Level 1 1      SPM 65 65      Minutes 20 20      METs 1.3 1.9        Home Exercise Plan   Plans to continue exercise at -- Home (comment)      Frequency -- Add 2 additional days to program exercise sessions.      Initial Home Exercises Provided -- 09/04/21               Exercise Comments:   Exercise Comments     Row Name 08/07/21 1614 08/23/21 1637 09/04/21 1648       Exercise Comments Pt  first day in the CRP2 program. Pt tolerated 20 minutes of exercise on the N[place], plus  stretches well. Pt is learning his THRR, RPE and exercise Rx. Will continue to monitor pt an progress workloads as tolerated without sign or symptom Pt has been absent due to illness. Will review education and goals when he returns Reviewed MET's, goals and home ex rx with pt today. Pt is tolerating exercise well with an average MET level of 1.9. Pt feels like he is doing well with his goals and will continue to exercise by adding 1-2 days at home for 30-45 mins per session by walking and doing chair fitness classes on youtube. Pt is planning on joining a gym but needs to check into what his insurance will help him cover. Gave him resources on [facility] and [employer]              Exercise Goals and Review:   Exercise Goals     Row Name 08/01/21 1450             Exercise Goals   Increase Physical Activity Yes       Intervention Provide advice, education, support and counseling about physical activity/exercise needs.;Develop an individualized exercise prescription for aerobic and resistive training based on initial evaluation findings, risk stratification, comorbidities and participant's personal goals.       Expected Outcomes Short Term: Attend rehab on a regular basis to increase amount of physical activity.;Long Term: Add in home exercise to make exercise part of routine and to increase amount of physical activity.;Long Term: Exercising regularly at least 3-5 days a week.       Increase Strength and Stamina Yes       Intervention Provide advice, education, support and counseling about physical activity/exercise needs.;Develop an individualized exercise prescription for aerobic and resistive training based on initial evaluation findings, risk stratification, comorbidities and participant's personal goals.       Expected Outcomes Short Term: Increase workloads from initial exercise prescription for resistance,  speed, and METs.;Short Term: Perform resistance training exercises routinely during rehab and add in resistance training at home;Long Term: Improve cardiorespiratory fitness, muscular endurance and strength as measured by increased METs and functional capacity (6MWT)       Able to understand and use rate of perceived exertion (RPE) scale Yes       Intervention Provide education and explanation on how to use RPE scale       Expected Outcomes Short Term: Able to use RPE daily in rehab to express subjective intensity level;Long Term:  Able to use RPE to guide intensity level when exercising independently       Knowledge and understanding of Target Heart Rate Range (THRR) Yes       Intervention Provide education and explanation of THRR including how the numbers were predicted and where they are located for reference       Expected Outcomes Short Term: Able to state/look up THRR;Long Term: Able to use THRR to govern intensity when exercising independently;Short Term: Able to use daily as guideline for intensity in rehab       Understanding of Exercise Prescription Yes       Intervention Provide education, explanation, and written materials on patient's individual exercise prescription       Expected Outcomes Short Term: Able to explain program exercise prescription;Long Term: Able to explain home exercise prescription to exercise independently                Exercise Goals Re-Evaluation :  Exercise Goals Re-Evaluation     [place] Name 08/07/21 1612 09/04/21 1643  Exercise Goal Re-Evaluation   Exercise Goals Review Increase Physical Activity;Increase Strength and Stamina;Able to understand and use rate of perceived exertion (RPE) scale;Knowledge and understanding of Target Heart Rate Range (THRR);Understanding of Exercise Prescription Increase Physical Activity;Increase Strength and Stamina;Able to understand and use rate of perceived exertion (RPE) scale;Knowledge and understanding of  Target Heart Rate Range (THRR);Understanding of Exercise Prescription      Comments Pt first day in the CRP2 program. Pt tolerated 20 minutes of exercise well. Pt is learning his THRR, RPE and exercise Rx. Reviewed MET's, goals and home ex rx with pt today. Pt is tolerating exercise well with an average MET level of 1.9. Pt feels like he is doing well with his goals and will continue to exercise by adding 1-2 days at home for 30-45 mins per session by walking and doing chair fitness classes on youtube. Pt is planning on joining a gym but needs to check into what his insurance will help him cover. Gave him resources on [employer] and [employer]      Expected Outcomes Will continue to monitor pt and progress workloads as tolerated without sign or symptom Pt will continue to exercise on his own at home 1-2 days a week. Will continue to monitor pt and progress workloads as tolerated without sign or symptom                Discharge Exercise Prescription (Final Exercise Prescription Changes):  Exercise Prescription Changes - 09/04/21 1640       Response to Exercise   Blood Pressure (Admit) 128/78    Blood Pressure (Exercise) 118/76    Blood Pressure (Exit) 118/80    Heart Rate (Admit) 82 bpm    Heart Rate (Exercise) 89 bpm    Heart Rate (Exit) 69 bpm    Rating of Perceived Exertion (Exercise) 12    Perceived Dyspnea (Exercise) 0    Symptoms right knee pain and bilateral arm pain 8/10    Comments Reviewed MET's goals and home ex rx    Duration Progress to 30 minutes of  aerobic without signs/symptoms of physical distress    Intensity THRR unchanged      Progression   Progression Continue to progress workloads to maintain intensity without signs/symptoms of physical distress.    Average METs 1.9      Resistance Training   Training Prescription No   No weight wednesday     NuStep   Level 1    SPM 65    Minutes 20    METs 1.9      Home Exercise Plan   Plans to continue exercise at Home  (comment)    Frequency Add 2 additional days to program exercise sessions.    Initial Home Exercises Provided 09/04/21             Nutrition:  Target Goals: Understanding of nutrition guidelines, daily intake of sodium <1567m, cholesterol <2074m calories 30% from fat and 7% or less from saturated fats, daily to have 5 or more servings of fruits and vegetables.  Biometrics:   Post Biometrics - 08/01/21 1444        Post  Biometrics   Waist Circumference 48 inches    Hip Circumference 45.5 inches    Waist to Hip Ratio 1.05 %    Triceps Skinfold 20 mm    % Body Fat 36.2 %    Grip Strength 28 kg    Flexibility --   Pt unable to reach   Single Leg  Stand 2.7 seconds             Nutrition Therapy Plan and Nutrition Goals:   Nutrition Assessments:  MEDIFICTS Score Key: ?70 Need to make dietary changes  40-70 Heart Healthy Diet ? 40 Therapeutic Level Cholesterol Diet   Picture Your Plate Scores: <94 Unhealthy dietary pattern with much room for improvement. 41-50 Dietary pattern unlikely to meet recommendations for good health and room for improvement. 51-60 More healthful dietary pattern, with some room for improvement.  >60 Healthy dietary pattern, although there may be some specific behaviors that could be improved.    Nutrition Goals Re-Evaluation:   Nutrition Goals Discharge (Final Nutrition Goals Re-Evaluation):   Psychosocial: Target Goals: Acknowledge presence or absence of significant depression and/or stress, maximize coping skills, provide positive support system. Participant is able to verbalize types and ability to use techniques and skills needed for reducing stress and depression.  Initial Review & Psychosocial Screening:  Initial Psych Review & Screening - 08/01/21 1341       Initial Review   Current issues with History of Depression      Family Dynamics   Good Support System? Yes   [name] lives alone. [name] has his two children who live in the  area for support     Barriers   Psychosocial barriers to participate in program There are no identifiable barriers or psychosocial needs.      Screening Interventions   Interventions Encouraged to exercise             Quality of Life Scores:  Quality of Life - 08/01/21 1455       Quality of Life   Select Quality of Life      Quality of Life Scores   Health/Function Pre 18.37 %    Socioeconomic Pre 25.67 %    Psych/Spiritual Pre 25.71 %    Family Pre 26.4 %    GLOBAL Pre 22.47 %            Scores of 19 and below usually indicate a poorer quality of life in these areas.  A difference of  2-3 points is a clinically meaningful difference.  A difference of 2-3 points in the total score of the Quality of Life Index has been associated with significant improvement in overall quality of life, self-image, physical symptoms, and general health in studies assessing change in quality of life.  PHQ-9: Recent Review Flowsheet Data     Depression screen [facility] 2/9 08/13/2021 08/01/2021 04/11/2021 04/05/2021 02/19/2021   Decreased Interest 0 0 0 0 0   Down, Depressed, Hopeless 0 0 0 0 0   PHQ - 2 Score 0 0 0 0 0      Interpretation of Total Score  Total Score Depression Severity:  1-4 = Minimal depression, 5-9 = Mild depression, 10-14 = Moderate depression, 15-19 = Moderately severe depression, 20-27 = Severe depression   Psychosocial Evaluation and Intervention:   Psychosocial Re-Evaluation:  Psychosocial Re-Evaluation     [place] Name 08/08/21 0759 08/27/21 1436 09/25/21 1727         Psychosocial Re-Evaluation   Current issues with History of Depression History of Depression History of Depression     Comments Reviewed quality of life questionnaire. [name] denies being currently depressed. [name] denies being currently depressed. [name] has not voiced any concerns or increased depression     Expected Outcomes [name] will have controlled or decreased depression upon completion of phase 2 cardiac  rehab. [name] will have controlled  or decreased depression upon completion of phase 2 cardiac rehab. [name] will have controlled or decreased depression upon completion of phase 2 cardiac rehab.     Interventions Encouraged to attend Cardiac Rehabilitation for the exercise;Stress management education;Relaxation education Encouraged to attend Cardiac Rehabilitation for the exercise;Stress management education;Relaxation education Encouraged to attend Cardiac Rehabilitation for the exercise;Stress management education;Relaxation education     Continue Psychosocial Services  No Follow up required No Follow up required No Follow up required              Psychosocial Discharge (Final Psychosocial Re-Evaluation):  Psychosocial Re-Evaluation - 09/25/21 1727       Psychosocial Re-Evaluation   Current issues with History of Depression    Comments [name] has not voiced any concerns or increased depression    Expected Outcomes [name] will have controlled or decreased depression upon completion of phase 2 cardiac rehab.    Interventions Encouraged to attend Cardiac Rehabilitation for the exercise;Stress management education;Relaxation education    Continue Psychosocial Services  No Follow up required             Vocational Rehabilitation: Provide vocational rehab assistance to qualifying candidates.   Vocational Rehab Evaluation & Intervention:  Vocational Rehab - 08/01/21 1346       Initial Vocational Rehab Evaluation & Intervention   Assessment shows need for Vocational Rehabilitation No   [name] is disabled and does not need vocational rehab at this time            Education: Education Goals: Education classes will be provided on a weekly basis, covering required topics. Participant will state understanding/return demonstration of topics presented.  Learning Barriers/Preferences:  Learning Barriers/Preferences - 08/01/21 1452       Learning Barriers/Preferences   Learning Barriers Exercise  Concerns   balance concerns   Learning Preferences Audio;Computer/Internet;Group Instruction;Individual Instruction;Pictoral;Skilled Demonstration;Verbal Instruction;Video;Written Material             Education Topics: Hypertension, Hypertension Reduction -Define heart disease and high blood pressure. Discus how high blood pressure affects the body and ways to reduce high blood pressure.   Exercise and Your Heart -Discuss why it is important to exercise, the FITT principles of exercise, normal and abnormal responses to exercise, and how to exercise safely.   Angina -Discuss definition of angina, causes of angina, treatment of angina, and how to decrease risk of having angina.   Cardiac Medications -Review what the following cardiac medications are used for, how they affect the body, and side effects that may occur when taking the medications.  Medications include Aspirin, Beta blockers, calcium channel blockers, ACE Inhibitors, angiotensin receptor blockers, diuretics, digoxin, and antihyperlipidemics.   Congestive Heart Failure -Discuss the definition of CHF, how to live with CHF, the signs and symptoms of CHF, and how keep track of weight and sodium intake.   Heart Disease and Intimacy -Discus the effect sexual activity has on the heart, how changes occur during intimacy as we age, and safety during sexual activity.   Smoking Cessation / COPD -Discuss different methods to quit smoking, the health benefits of quitting smoking, and the definition of COPD.   Nutrition I: Fats -Discuss the types of cholesterol, what cholesterol does to the heart, and how cholesterol levels can be controlled.   Nutrition II: Labels -Discuss the different components of food labels and how to read food label   Heart Parts/Heart Disease and PAD -Discuss the anatomy of the heart, the pathway of blood circulation through the heart, and  these are affected by heart disease.   Stress I: Signs  and Symptoms -Discuss the causes of stress, how stress may lead to anxiety and depression, and ways to limit stress.   Stress II: Relaxation -Discuss different types of relaxation techniques to limit stress.   Warning Signs of Stroke / TIA -Discuss definition of a stroke, what the signs and symptoms are of a stroke, and how to identify when someone is having stroke.   Knowledge Questionnaire Score:  Knowledge Questionnaire Score - 08/01/21 1456       Knowledge Questionnaire Score   Pre Score 20/24             Core Components/Risk Factors/Patient Goals at Admission:  Personal Goals and Risk Factors at Admission - 08/01/21 1459       Core Components/Risk Factors/Patient Goals on Admission    Weight Management Yes;Obesity;Weight Loss    Intervention Weight Management: Develop a combined nutrition and exercise program designed to reach desired caloric intake, while maintaining appropriate intake of nutrient and fiber, sodium and fats, and appropriate energy expenditure required for the weight goal.;Weight Management: Provide education and appropriate resources to help participant work on and attain dietary goals.;Weight Management/Obesity: Establish reasonable short term and long term weight goals.;Obesity: Provide education and appropriate resources to help participant work on and attain dietary goals.    Admit Weight 221 lb 3.2 oz (100.3 kg)    Expected Outcomes Short Term: Continue to assess and modify interventions until short term weight is achieved;Long Term: Adherence to nutrition and physical activity/exercise program aimed toward attainment of established weight goal;Understanding recommendations for meals to include 15-35% energy as protein, 25-35% energy from fat, 35-60% energy from carbohydrates, less than 223m of dietary cholesterol, 20-35 gm of total fiber daily;Weight Loss: Understanding of general recommendations for a balanced deficit meal plan, which promotes 1-2 lb  weight loss per week and includes a negative energy balance of [phone] kcal/d;Understanding of distribution of calorie intake throughout the day with the consumption of 4-5 meals/snacks    Diabetes Yes    Intervention Provide education about signs/symptoms and action to take for hypo/hyperglycemia.;Provide education about proper nutrition, including hydration, and aerobic/resistive exercise prescription along with prescribed medications to achieve blood glucose in normal ranges: Fasting glucose 65-99 mg/dL    Expected Outcomes Short Term: Participant verbalizes understanding of the signs/symptoms and immediate care of hyper/hypoglycemia, proper foot care and importance of medication, aerobic/resistive exercise and nutrition plan for blood glucose control.;Long Term: Attainment of HbA1C < 7%.    Hypertension Yes    Intervention Provide education on lifestyle modifcations including regular physical activity/exercise, weight management, moderate sodium restriction and increased consumption of fresh fruit, vegetables, and low fat dairy, alcohol moderation, and smoking cessation.;Monitor prescription use compliance.    Expected Outcomes Short Term: Continued assessment and intervention until BP is < 140/938mHG in hypertensive participants. < 130/8039mG in hypertensive participants with diabetes, heart failure or chronic kidney disease.;Long Term: Maintenance of blood pressure at goal levels.    Lipids Yes    Intervention Provide education and support for participant on nutrition & aerobic/resistive exercise along with prescribed medications to achieve LDL <32m90mDL >40mg32m Expected Outcomes Short Term: Participant states understanding of desired cholesterol values and is compliant with medications prescribed. Participant is following exercise prescription and nutrition guidelines.;Long Term: Cholesterol controlled with medications as prescribed, with individualized exercise RX and with personalized  nutrition plan. Value goals: LDL < 32mg,63m > 40 mg.  Personal Goal Other Yes    Personal Goal Short term: walk for longer, decrease SOB Long term: better heart health    Intervention will continue to monitor pt and progress workloads as tolerated without sign or symptom.    Expected Outcomes Pt will achieve his goals             Core Components/Risk Factors/Patient Goals Review:   Goals and Risk Factor Review     Row Name 08/08/21 0803 08/27/21 1437 09/25/21 1728         Core Components/Risk Factors/Patient Goals Review   Personal Goals Review Weight Management/Obesity;Stress;Hypertension;Lipids;Diabetes Weight Management/Obesity;Stress;Hypertension;Lipids;Diabetes Weight Management/Obesity;Stress;Hypertension;Lipids;Diabetes     Review [name] started cardiac rehab on 08/07/21 and did well with exercise for his fitness level. [name] is somewhat deconditioned. Vital signs and CBG's were stable. [name] returned to exercise after being out last week with a cold. Vital signs and CBG's were stable on 08/26/21. Will continue to monitor. [name] had some low bP's post exercise. Amlodipine was stopped will continiue to monitor. CBG's have been stable.[name] will complete cardiac rehab on 10/04/21     Expected Outcomes [name] will continue to participate in phase 2 cardiac rehab for exercise, nutrition and lifestyle modifications [name] will continue to participate in phase 2 cardiac rehab for exercise, nutrition and lifestyle modifications [name] will continue to participate in phase 2 cardiac rehab for exercise, nutrition and lifestyle modifications              Core Components/Risk Factors/Patient Goals at Discharge (Final Review):   Goals and Risk Factor Review - 09/25/21 1728       Core Components/Risk Factors/Patient Goals Review   Personal Goals Review Weight Management/Obesity;Stress;Hypertension;Lipids;Diabetes    Review [name] had some low bP's post exercise. Amlodipine was stopped will continiue to  monitor. CBG's have been stable.[name] will complete cardiac rehab on 10/04/21    Expected Outcomes [name] will continue to participate in phase 2 cardiac rehab for exercise, nutrition and lifestyle modifications             ITP Comments:  ITP Comments     Row Name 08/01/21 1340 08/08/21 0807 08/27/21 1435 09/25/21 1727     ITP Comments Dr [name] MD, Medical Director 30 Day ITP Review. [name] started cardiac rehab on 08/07/21. [name] did fair with exercise for his fitness level. 30 Day ITP Review. [name] returned to exercise after being absent due a cold on 08/26/21. 30 Day ITP Review. [name] has good participation when in attendance at cardiac rehab             Comments: See ITP comments.Barnet Pall, RN,BSN 09/25/2021 5:31 PM

## 2021-09-26 ENCOUNTER — Other Ambulatory Visit (HOSPITAL_COMMUNITY): Payer: Self-pay

## 2021-09-26 NOTE — Telephone Encounter (Signed)
I think we already put in a referral to the sleep center for mask fitting and I think he may find that helps a lot. I have the referral form for [facility]. If he would like in-person opportunity to get an oral appliance with face to face help, we can refer him to Dr Augustina Mood, DDS ho is an expert in this device. Let me know which he prefers.

## 2021-09-27 ENCOUNTER — Other Ambulatory Visit: Payer: Self-pay

## 2021-09-27 ENCOUNTER — Encounter (HOSPITAL_COMMUNITY)
Admission: RE | Admit: 2021-09-27 | Discharge: 2021-09-27 | Disposition: A | Discharge status: Home / Self Care | Payer: Medicare Other | Source: Ambulatory Visit | Attending: Cardiology | Admitting: Cardiology

## 2021-09-27 ENCOUNTER — Other Ambulatory Visit (HOSPITAL_COMMUNITY): Payer: Self-pay

## 2021-09-27 DIAGNOSIS — Z955 Presence of coronary angioplasty implant and graft: Secondary | ICD-10-CM

## 2021-09-27 NOTE — Telephone Encounter (Signed)
Called and spoke with patient to let him know of message from Dr. Annamaria Boots. He states that he is not interested in [place] doing another mask fitting at the sleep center. He states that he has already done that several times and is still uncomfortable with mask for CPAP. He would like the referral to the local [facility] for the oral appliance. Order has been placed. Advised him he would get a call to get him scheduled with them. Nothing further needed at this time.

## 2021-09-30 ENCOUNTER — Encounter (HOSPITAL_COMMUNITY): Payer: Medicare Other

## 2021-09-30 ENCOUNTER — Other Ambulatory Visit (HOSPITAL_COMMUNITY): Payer: Self-pay

## 2021-09-30 ENCOUNTER — Telehealth (HOSPITAL_COMMUNITY): Payer: Self-pay | Admitting: *Deleted

## 2021-09-30 NOTE — Addendum Note (Signed)
Addended by: Colon Branch on: 09/30/2021 02:23 PM   Modules accepted: Orders

## 2021-09-30 NOTE — Addendum Note (Signed)
Addended by: Elby Beck R on: 09/30/2021 02:19 PM   Modules accepted: Orders

## 2021-09-30 NOTE — Telephone Encounter (Signed)
Patient left message on department voicemail. Patient called to see if we were open today and to report that he wasn't feeling well and would be absent.

## 2021-10-02 ENCOUNTER — Other Ambulatory Visit: Payer: Self-pay

## 2021-10-02 ENCOUNTER — Encounter (HOSPITAL_COMMUNITY)
Admission: RE | Admit: 2021-10-02 | Discharge: 2021-10-02 | Disposition: A | Discharge status: Home / Self Care | Payer: Medicare Other | Source: Ambulatory Visit | Attending: Cardiology | Admitting: Cardiology

## 2021-10-02 ENCOUNTER — Encounter: Payer: Self-pay | Admitting: Endocrinology

## 2021-10-02 ENCOUNTER — Other Ambulatory Visit (HOSPITAL_COMMUNITY): Payer: Self-pay

## 2021-10-02 VITALS — Ht 65.25 in | Wt 214.7 lb

## 2021-10-02 DIAGNOSIS — Z955 Presence of coronary angioplasty implant and graft: Secondary | ICD-10-CM | POA: Diagnosis not present

## 2021-10-02 MED ORDER — ALLOPURINOL 100 MG PO TABS
200.0000 mg | ORAL_TABLET | Freq: Every day | ORAL | 0 refills | Status: AC
  Filled 2021-10-02: qty 60, 30d supply, fill #0

## 2021-10-03 ENCOUNTER — Telehealth: Payer: Self-pay

## 2021-10-03 ENCOUNTER — Telehealth: Payer: Self-pay | Admitting: Internal Medicine

## 2021-10-03 DIAGNOSIS — L309 Dermatitis, unspecified: Secondary | ICD-10-CM

## 2021-10-03 NOTE — Telephone Encounter (Signed)
Copied from [place] [phone]. Topic: Referral - Request for Referral >> Oct 03, 2021  3:46 PM Valere Dross wrote: Has patient seen PCP for this complaint? Yes.    Referral for which specialty: Dermatology *Doesn't like the office he was originally referred to*  Preferred provider/office: Dr. Ardine Bjork [phone]  Reason for referral: Hand

## 2021-10-03 NOTE — Telephone Encounter (Signed)
Will forward to provider  

## 2021-10-03 NOTE — Telephone Encounter (Signed)
That is on hold for now. He is getting local opinion.

## 2021-10-03 NOTE — Telephone Encounter (Signed)
Spoke to [place] with [facility] and relayed below message. She voiced her understanding and had no further questions.  Nothing further needed.

## 2021-10-03 NOTE — Telephone Encounter (Signed)
Spoke to [place] with [facility]. She would like update on CMN that was faxed.   Dr. Annamaria Boots, please advise. thanks

## 2021-10-04 ENCOUNTER — Encounter (HOSPITAL_COMMUNITY)
Admission: RE | Admit: 2021-10-04 | Discharge: 2021-10-04 | Disposition: A | Discharge status: Home / Self Care | Payer: Medicare Other | Source: Ambulatory Visit | Attending: Cardiology | Admitting: Cardiology

## 2021-10-04 ENCOUNTER — Telehealth: Payer: Self-pay | Admitting: Internal Medicine

## 2021-10-04 ENCOUNTER — Other Ambulatory Visit: Payer: Self-pay

## 2021-10-04 DIAGNOSIS — Z955 Presence of coronary angioplasty implant and graft: Secondary | ICD-10-CM

## 2021-10-04 NOTE — Progress Notes (Signed)
Discharge Progress Report  Patient Details  Name: Marco [name] MRN: [id number] Date of Birth: 1956-01-18 Referring Provider:   Flowsheet Row CARDIAC REHAB PHASE II ORIENTATION from 08/01/2021 in [place]  Referring Provider Dr. [name], MD        Number of Visits: 15  Reason for Discharge:  Patient reached a stable level of exercise. Patient independent in their exercise. Patient has met program and personal goals.  Smoking History:  Social History   Tobacco Use  Smoking Status Former   Packs/day: 0.50   Years: 10.00   Pack years: 5.00   Types: Cigarettes   Quit date: 04/11/2008   Years since quitting: 13.5  Smokeless Tobacco Never    Diagnosis:  06/11/21 S/P DES, PTCA LAD  ADL UCSD:   Initial Exercise Prescription:  Initial Exercise Prescription - 08/01/21 1400       Date of Initial Exercise RX and Referring Provider   Date 08/01/21    Referring Provider Dr. [name], MD    Expected Discharge Date 09/27/20      NuStep   Level 1    SPM 65    Minutes 20    METs 1.5      Prescription Details   Frequency (times per week) 3    Duration Progress to 30 minutes of continuous aerobic without signs/symptoms of physical distress      Intensity   THRR 40-80% of Max Heartrate 62-123    Ratings of Perceived Exertion 11-13    Perceived Dyspnea 0-4      Progression   Progression Continue progressive overload as per policy without signs/symptoms or physical distress.      Resistance Training   Training Prescription Yes    Weight 2lbs    Reps 10-15             Discharge Exercise Prescription (Final Exercise Prescription Changes):  Exercise Prescription Changes - 10/04/21 1636       Response to Exercise   Blood Pressure (Admit) 142/80    Blood Pressure (Exercise) 128/72    Blood Pressure (Exit) 126/62    Heart Rate (Admit) 79 bpm    Heart Rate (Exercise) 106 bpm    Heart Rate (Exit) 87 bpm    Rating of  Perceived Exertion (Exercise) 12    Perceived Dyspnea (Exercise) 0    Comments pt graduated the CRP2 program    Duration Progress to 30 minutes of  aerobic without signs/symptoms of physical distress    Intensity THRR unchanged      Progression   Progression Continue to progress workloads to maintain intensity without signs/symptoms of physical distress.    Average METs 2.2      Resistance Training   Training Prescription Yes   No weight wednesday   Weight 3 lbs wts    Reps 10-15    Time 10 Minutes      NuStep   Level 2    SPM 65    Minutes 30    METs 2.2      Home Exercise Plan   Plans to continue exercise at Home (comment)    Frequency Add 2 additional days to program exercise sessions.    Initial Home Exercises Provided 09/04/21             Functional Capacity:  6 Minute Walk     Row Name 08/01/21 1433 10/02/21 1630       6 Minute Walk   Phase Initial  Discharge    Distance 724 feet 1320 feet    Distance % Change -- 82.32 %    Distance Feet Change -- 596 ft    Walk Time 6 minutes 6 minutes    # of Rest Breaks 1  1 rest break from 5:20-5:45 0    MPH 1.37 2.5    METS 1.41 2.58    RPE 11 10    Perceived Dyspnea  1 0    VO2 Peak 4.93 9.03    Symptoms Yes (comment) Yes (comment)    Comments 6/10 right leg pain, rest break from 5:20-5:45 last lap, bilateral hip pain 6/10    Resting HR 82 bpm 78 bpm    Resting BP 124/62 132/78    Resting Oxygen Saturation  98 % 95 %    Exercise Oxygen Saturation  during 6 min walk 97 % 95 %    Max Ex. HR 86 bpm 96 bpm    Max Ex. BP 124/62 122/78    2 Minute Post BP 113/69 122/76             Psychological, QOL, Others - Outcomes: PHQ 2/9: Depression screen [facility] 2/9 10/04/2021 08/13/2021 08/01/2021 04/11/2021 04/05/2021  Decreased Interest 0 0 0 0 0  Down, Depressed, Hopeless 0 0 0 0 0  PHQ - 2 Score 0 0 0 0 0  Altered sleeping - - - - -  Tired, decreased energy - - - - -  Change in appetite - - - - -  Feeling bad or  failure about yourself  - - - - -  Trouble concentrating - - - - -  Moving slowly or fidgety/restless - - - - -  Suicidal thoughts - - - - -  PHQ-9 Score - - - - -  Some recent data might be hidden    Quality of Life:  Quality of Life - 10/04/21 1628       Quality of Life   Select Quality of Life      Quality of Life Scores   Health/Function Post 20.04 %    Socioeconomic Post 21.79 %    Psych/Spiritual Post 24.79 %    Family Post 23.88 %    GLOBAL Post 22.07 %             Personal Goals: Goals established at orientation with interventions provided to work toward goal.  Personal Goals and Risk Factors at Admission - 08/01/21 1459       Core Components/Risk Factors/Patient Goals on Admission    Weight Management Yes;Obesity;Weight Loss    Intervention Weight Management: Develop a combined nutrition and exercise program designed to reach desired caloric intake, while maintaining appropriate intake of nutrient and fiber, sodium and fats, and appropriate energy expenditure required for the weight goal.;Weight Management: Provide education and appropriate resources to help participant work on and attain dietary goals.;Weight Management/Obesity: Establish reasonable short term and long term weight goals.;Obesity: Provide education and appropriate resources to help participant work on and attain dietary goals.    Admit Weight 221 lb 3.2 oz (100.3 kg)    Expected Outcomes Short Term: Continue to assess and modify interventions until short term weight is achieved;Long Term: Adherence to nutrition and physical activity/exercise program aimed toward attainment of established weight goal;Understanding recommendations for meals to include 15-35% energy as protein, 25-35% energy from fat, 35-60% energy from carbohydrates, less than $RemoveB'200mg'XYqWgWtQ$  of dietary cholesterol, 20-35 gm of total fiber daily;Weight Loss: Understanding of general recommendations for a  balanced deficit meal plan, which promotes  1-2 lb weight loss per week and includes a negative energy balance of [phone] kcal/d;Understanding of distribution of calorie intake throughout the day with the consumption of 4-5 meals/snacks    Diabetes Yes    Intervention Provide education about signs/symptoms and action to take for hypo/hyperglycemia.;Provide education about proper nutrition, including hydration, and aerobic/resistive exercise prescription along with prescribed medications to achieve blood glucose in normal ranges: Fasting glucose 65-99 mg/dL    Expected Outcomes Short Term: Participant verbalizes understanding of the signs/symptoms and immediate care of hyper/hypoglycemia, proper foot care and importance of medication, aerobic/resistive exercise and nutrition plan for blood glucose control.;Long Term: Attainment of HbA1C < 7%.    Hypertension Yes    Intervention Provide education on lifestyle modifcations including regular physical activity/exercise, weight management, moderate sodium restriction and increased consumption of fresh fruit, vegetables, and low fat dairy, alcohol moderation, and smoking cessation.;Monitor prescription use compliance.    Expected Outcomes Short Term: Continued assessment and intervention until BP is < 140/31mm HG in hypertensive participants. < 130/56mm HG in hypertensive participants with diabetes, heart failure or chronic kidney disease.;Long Term: Maintenance of blood pressure at goal levels.    Lipids Yes    Intervention Provide education and support for participant on nutrition & aerobic/resistive exercise along with prescribed medications to achieve LDL '70mg'$ , HDL >$Remo'40mg'GvenT$ .    Expected Outcomes Short Term: Participant states understanding of desired cholesterol values and is compliant with medications prescribed. Participant is following exercise prescription and nutrition guidelines.;Long Term: Cholesterol controlled with medications as prescribed, with individualized exercise RX and with personalized  nutrition plan. Value goals: LDL < $Rem'70mg'pjrU$ , HDL > 40 mg.    Personal Goal Other Yes    Personal Goal Short term: walk for longer, decrease SOB Long term: better heart health    Intervention will continue to monitor pt and progress workloads as tolerated without sign or symptom.    Expected Outcomes Pt will achieve his goals              Personal Goals Discharge:  Goals and Risk Factor Review     Row Name 08/08/21 0803 08/27/21 1437 09/25/21 1728         Core Components/Risk Factors/Patient Goals Review   Personal Goals Review Weight Management/Obesity;Stress;Hypertension;Lipids;Diabetes Weight Management/Obesity;Stress;Hypertension;Lipids;Diabetes Weight Management/Obesity;Stress;Hypertension;Lipids;Diabetes     Review [name] started cardiac rehab on 08/07/21 and did well with exercise for his fitness level. [name] is somewhat deconditioned. Vital signs and CBG's were stable. [name] returned to exercise after being out last week with a cold. Vital signs and CBG's were stable on 08/26/21. Will continue to monitor. [name] had some low bP's post exercise. Amlodipine was stopped will continiue to monitor. CBG's have been stable.[name] will complete cardiac rehab on 10/04/21     Expected Outcomes [name] will continue to participate in phase 2 cardiac rehab for exercise, nutrition and lifestyle modifications [name] will continue to participate in phase 2 cardiac rehab for exercise, nutrition and lifestyle modifications [name] will continue to participate in phase 2 cardiac rehab for exercise, nutrition and lifestyle modifications              Exercise Goals and Review:  Exercise Goals     Row Name 08/01/21 1450             Exercise Goals   Increase Physical Activity Yes       Intervention Provide advice, education, support and counseling about physical activity/exercise needs.;Develop an individualized exercise prescription for  aerobic and resistive training based on initial evaluation findings, risk  stratification, comorbidities and participant's personal goals.       Expected Outcomes Short Term: Attend rehab on a regular basis to increase amount of physical activity.;Long Term: Add in home exercise to make exercise part of routine and to increase amount of physical activity.;Long Term: Exercising regularly at least 3-5 days a week.       Increase Strength and Stamina Yes       Intervention Provide advice, education, support and counseling about physical activity/exercise needs.;Develop an individualized exercise prescription for aerobic and resistive training based on initial evaluation findings, risk stratification, comorbidities and participant's personal goals.       Expected Outcomes Short Term: Increase workloads from initial exercise prescription for resistance, speed, and METs.;Short Term: Perform resistance training exercises routinely during rehab and add in resistance training at home;Long Term: Improve cardiorespiratory fitness, muscular endurance and strength as measured by increased METs and functional capacity (6MWT)       Able to understand and use rate of perceived exertion (RPE) scale Yes       Intervention Provide education and explanation on how to use RPE scale       Expected Outcomes Short Term: Able to use RPE daily in rehab to express subjective intensity level;Long Term:  Able to use RPE to guide intensity level when exercising independently       Knowledge and understanding of Target Heart Rate Range (THRR) Yes       Intervention Provide education and explanation of THRR including how the numbers were predicted and where they are located for reference       Expected Outcomes Short Term: Able to state/look up THRR;Long Term: Able to use THRR to govern intensity when exercising independently;Short Term: Able to use daily as guideline for intensity in rehab       Understanding of Exercise Prescription Yes       Intervention Provide education, explanation, and written  materials on patient's individual exercise prescription       Expected Outcomes Short Term: Able to explain program exercise prescription;Long Term: Able to explain home exercise prescription to exercise independently                Exercise Goals Re-Evaluation:  Exercise Goals Re-Evaluation     Row Name 08/07/21 1612 09/04/21 1643 10/04/21 1638         Exercise Goal Re-Evaluation   Exercise Goals Review Increase Physical Activity;Increase Strength and Stamina;Able to understand and use rate of perceived exertion (RPE) scale;Knowledge and understanding of Target Heart Rate Range (THRR);Understanding of Exercise Prescription Increase Physical Activity;Increase Strength and Stamina;Able to understand and use rate of perceived exertion (RPE) scale;Knowledge and understanding of Target Heart Rate Range (THRR);Understanding of Exercise Prescription Increase Physical Activity;Increase Strength and Stamina;Able to understand and use rate of perceived exertion (RPE) scale;Knowledge and understanding of Target Heart Rate Range (THRR);Understanding of Exercise Prescription     Comments Pt first day in the CRP2 program. Pt tolerated 20 minutes of exercise well. Pt is learning his THRR, RPE and exercise Rx. Reviewed MET's, goals and home ex rx with pt today. Pt is tolerating exercise well with an average MET level of 1.9. Pt feels like he is doing well with his goals and will continue to exercise by adding 1-2 days at home for 30-45 mins per session by walking and doing chair fitness classes on youtube. Pt is planning on joining a gym but needs to check  into what his insurance will help him cover. Gave him resources on [employer] and [employer] Pt graduated the [employer] program. Pt progressed well in the program with an average MTE level of 2.2. Pt will continue to exercise on his own by going to [employer] or walking 2-3 days a week for 30 minutes per session     Expected Outcomes Will continue to monitor pt and  progress workloads as tolerated without sign or symptom Pt will continue to exercise on his own at home 1-2 days a week. Will continue to monitor pt and progress workloads as tolerated without sign or symptom Pt will continue to exercise on his own and gain strength              Nutrition & Weight - Outcomes:   Post Biometrics - 10/02/21 1633        Post  Biometrics   Height 5' 5.25" (1.657 m)    Weight 97.4 kg    Waist Circumference 47.75 inches    Hip Circumference 43.25 inches    Waist to Hip Ratio 1.1 %    BMI (Calculated) 35.47    Triceps Skinfold 25 mm    % Body Fat 36.6 %    Grip Strength 30 kg    Flexibility --   Pt unable to reach   Single Leg Stand 5 seconds             Nutrition:   Nutrition Discharge:   Education Questionnaire Score:  Knowledge Questionnaire Score - 10/04/21 1625       Knowledge Questionnaire Score   Post Score 19/24             Goals reviewed with patient; copy given to patient.[name] graduated from cardiac rehab program on 10/04/21 with completion of 15 exercise sessions in Phase II. Pt maintained good attendance and progressed nicely during his participation in rehab as evidenced by increased MET level.   Medication list reconciled. Repeat  PHQ score- 0 .  Pt has made significant lifestyle changes and should be commended for his success. Pt feels he has achieved his goals during cardiac rehab.   Pt plans to continue exercise by joining the gym. [name] increased his distance on his post exercise walk test by 596 feet. We are proud of [name]'s progress!Harrell Gave RN BSN

## 2021-10-04 NOTE — Telephone Encounter (Signed)
Sent pt a MyChart message

## 2021-10-04 NOTE — Telephone Encounter (Signed)
Pt is calling to schedule outside orders from rheumatology.Called office to see if it was ok. No reponse.

## 2021-10-07 ENCOUNTER — Other Ambulatory Visit (HOSPITAL_COMMUNITY): Payer: Self-pay | Admitting: Internal Medicine

## 2021-10-07 ENCOUNTER — Other Ambulatory Visit (HOSPITAL_COMMUNITY): Payer: Self-pay

## 2021-10-07 MED ORDER — VYNDAMAX 61 MG PO CAPS
1.0000 | ORAL_CAPSULE | Freq: Every day | ORAL | 0 refills | Status: AC
  Filled 2021-10-07: qty 30, 30d supply, fill #0

## 2021-10-07 NOTE — Telephone Encounter (Signed)
LVM for patient to call back, advised that if they are lab orders from Rheumatology we would be unable to schedule.

## 2021-10-08 ENCOUNTER — Other Ambulatory Visit: Payer: Self-pay

## 2021-10-08 ENCOUNTER — Ambulatory Visit (INDEPENDENT_AMBULATORY_CARE_PROVIDER_SITE_OTHER): Payer: Medicare Other | Admitting: Cardiology

## 2021-10-08 ENCOUNTER — Encounter: Payer: Self-pay | Admitting: Cardiology

## 2021-10-08 VITALS — BP 132/88 | HR 82 | Ht 65.25 in | Wt 216.0 lb

## 2021-10-08 DIAGNOSIS — R2681 Unsteadiness on feet: Secondary | ICD-10-CM | POA: Diagnosis not present

## 2021-10-08 DIAGNOSIS — I10 Essential (primary) hypertension: Secondary | ICD-10-CM | POA: Diagnosis not present

## 2021-10-08 DIAGNOSIS — I2510 Atherosclerotic heart disease of native coronary artery without angina pectoris: Secondary | ICD-10-CM

## 2021-10-08 DIAGNOSIS — G4733 Obstructive sleep apnea (adult) (pediatric): Secondary | ICD-10-CM

## 2021-10-08 DIAGNOSIS — E7800 Pure hypercholesterolemia, unspecified: Secondary | ICD-10-CM | POA: Diagnosis not present

## 2021-10-08 DIAGNOSIS — R269 Unspecified abnormalities of gait and mobility: Secondary | ICD-10-CM | POA: Diagnosis not present

## 2021-10-08 DIAGNOSIS — N183 Chronic kidney disease, stage 3 unspecified: Secondary | ICD-10-CM

## 2021-10-08 DIAGNOSIS — I5042 Chronic combined systolic (congestive) and diastolic (congestive) heart failure: Secondary | ICD-10-CM

## 2021-10-08 DIAGNOSIS — G4700 Insomnia, unspecified: Secondary | ICD-10-CM | POA: Diagnosis not present

## 2021-10-08 DIAGNOSIS — I480 Paroxysmal atrial fibrillation: Secondary | ICD-10-CM | POA: Diagnosis not present

## 2021-10-08 NOTE — Progress Notes (Signed)
Cardiology Office Note   Date:  10/08/2021   ID:  [name], Marco 1956/05/28, MRN [id number]  PCP:  [name], MD  Cardiologist:  [name]    Chief Complaint  Patient presents with   Coronary Artery Disease   Hypertension   Hyperlipidemia   Congestive Heart Failure   Cardiomyopathy   Sleep Apnea   Atrial Fibrillation       History of Present Illness: Marco [name] is a 66 y.o. male who presents for review of stress test and discuss cardiac cath.     hx of combined systolic and diastolic heart failure, nonischemic cardiomyopathy, mild nonobstructive coronary artery disease by cardiac catheterization in 2017, persistent atrial fibrillation with prior history of ablation in [place] in 2017, diabetes, hypertension, chronic kidney disease, sleep apnea, obesity and OSA on CPAP.    His EF was 45-50 but improved to normal by Echo in 01/2017. CHADS2-VASc=4 (CHF, DM, CAD, HTN).  Anticoagulation for PAF has been discontinued in the past secondary to anemia.  He has a history of chronic dyspnea and chronic chest discomfort.  Evaluation by pulmonology included a cardiopulmonary stress test in 2017.  However, this was not conclusive due to submaximal effort.    He was seen in August 2019 for worsening shortness of breath.  An echocardiogram in September 2019 demonstrated worsening LV function with an EF of 35-40%.     Coronary CTA was planned however, the patient was admitted in October 2019 with chest pain and mildly elevated troponin.  Echocardiogram demonstrated EF 25-30%.  Cardiac catheterization demonstrated mild plaque without obstructive disease.  CHF medications were adjusted for nonischemic cardiomyopathy.     He was evaluated by [name] in the advanced heart failure clinic in October 2019.  PYP scan was strongly suggestive of transthyretin amyloidosis (grade 2, H/CLL equal 1.94) .  After discussion with [name] with Hematology he felt he did not have  myeloma.  Genetic testing negative and myeloma panel with MUGUS but no myeloma.  Now on Tafamadis and followed by AHF clinic. No Entresto, Hydralazine and imdur due to soft BP.   He saw me on 04/29/21 and complained of chest pain.  His Imdur was increased to 60 mg daily and nuclear stress test was done which showed evidence of infarct but pt without hx of MI,  EF 36% with multiple focal wall motion abnormalities.  With ongoing chest pain and EKG changes and abnormal nuc he underwent cardiac catheterization 06/11/2021 showing 10% ostial left circumflex, 75% mid LAD, 75% ostial D2 status post PCI of the mid LAD lesion and balloon angioplasty of the diagonal lesion.  LVEDP was normal.  He was started on DAPT with Plavix 75 mg daily and aspirin 81 mg daily.    He is here today for followup and is doing well.  He has chronic stable angina that mainly occurs with extreme exertion such as caring heavy things.  He also has chronic DOE that remains the same.  He denies any  PND, orthopnea, LE edema, dizziness, palpitations or syncope. He is compliant with his meds and is tolerating meds with no SE.   He recently had issues at CR with low BPs and his Amlodipine was stopped.  He has not had any further dizzy spells and BP has been well controlled at cardiac rehab.   Past Medical History:  Diagnosis Date   Benign colon polyp 08/01/2013   [facility] in [place]. large base tranverse  colon polyp was biopsied. polyp was benign with minimal surface hyperplastic change.   Chronic combined systolic and diastolic CHF (congestive heart failure) (HCC)    Chronic pain    CKD (chronic kidney disease), stage III (HCC)    Diabetes mellitus without complication ([place])    Diverticulosis    Esophageal hiatal hernia 07/29/2013   confirmed on EGD    Esophageal stricture    Essential hypertension    Gastritis 07/29/2013   confirmed on EGD, bx done an negative for intestinal metaplasia, dsyplasia or H. pylori. normal  gastric emptying study done 07/13/2013.   GERD (gastroesophageal reflux disease)    Morbid obesity (HCC)    Non-obstructive CAD    Coronary CTA 04/2021 showed coronary calcium score of 535 which is 93rd % for age and sex matched controls.  There is mild CAD with mild calcified plaque in the distal LAD 25-49% and also in the ostial D3.  There is minimal calcified plaque in the mid LCx and mild mixed plaque in the pRCA with 25-49% stenosis.   Persistent atrial fibrillation ([place])    a. 02/2013 s/p rfca in [place], [place]-->prev on Xarelto, d/c'd 2/2 anemia, ? GIB.   Rheumatoid arthritis ([place])    Sigmoid diverticulosis 08/01/2013   confirmed on colonscopy. record scanned into chart    Past Surgical History:  Procedure Laterality Date   CARDIAC CATHETERIZATION  05/2014   ablation for atrial fibrillation   CARDIAC CATHETERIZATION N/A 11/16/2015   Procedure: Left Heart Cath and Coronary Angiography;  Surgeon: Leonie Man, MD;  Location: [facility];  Service: Cardiovascular;  Laterality: N/A;   COLON RESECTION  09/2013   due to large, abnormal polpy. non cancerous per patient.    COLON SURGERY  09/2014   colon resection    CORONARY STENT INTERVENTION N/A 06/11/2021   Procedure: CORONARY STENT INTERVENTION;  Surgeon: Jettie Booze, MD;  Location: [facility];  Service: Cardiovascular;  Laterality: N/A;   EXTRACORPOREAL SHOCK WAVE LITHOTRIPSY Left 01/03/2021   Procedure: EXTRACORPOREAL SHOCK WAVE LITHOTRIPSY (ESWL);  Surgeon: Robley Fries, MD;  Location: [facility];  Service: Urology;  Laterality: Left;   IR PERC PLEURAL DRAIN W/INDWELL CATH W/IMG GUIDE  01/07/2020   LEFT HEART CATH AND CORONARY ANGIOGRAPHY N/A 05/19/2018   Procedure: LEFT HEART CATH AND CORONARY ANGIOGRAPHY;  Surgeon: Wellington Hampshire, MD;  Location: [facility];  Service: Cardiovascular;  Laterality: N/A;   LEFT HEART CATH AND CORONARY ANGIOGRAPHY N/A 06/11/2021   Procedure: LEFT  HEART CATH AND CORONARY ANGIOGRAPHY;  Surgeon: Jettie Booze, MD;  Location: [facility];  Service: Cardiovascular;  Laterality: N/A;     Current Outpatient Medications  Medication Sig Dispense Refill   Accu-Chek Softclix Lancets lancets USE AS INSTRUCTED 100 each 12   acetaminophen (TYLENOL) 650 MG CR tablet Take 1,300 mg by mouth in the morning and at bedtime.     acitretin (SORIATANE) 25 MG capsule Take 2 capsules (50 mg total) by mouth daily. 60 capsule 0   albuterol (VENTOLIN HFA) 108 (90 Base) MCG/ACT inhaler INHALE 2 PUFFS INTO THE LUNGS EVERY 6 HOURS AS NEEDED FOR WHEEZING OR SHORTNESS OF BREATH. 8.5 g 1   allopurinol (ZYLOPRIM) 100 MG tablet Take 2 tablets by mouth daily. 60 tablet 0   amLODipine (NORVASC) 5 MG tablet Take 1 tablet (5 mg total) by mouth daily. 90 tablet 1   aspirin (EQ ASPIRIN ADULT LOW DOSE) 81 MG EC  tablet Take 1 tablet (81 mg total) by mouth daily. TAKE 1 TABLET BY MOUTH ONCE DAILY. SWALLOW WHOLE 30 tablet 12   atorvastatin (LIPITOR) 40 MG tablet Take 1 tablet (40 mg total) by mouth daily. 90 tablet 3   carvedilol (COREG) 12.5 MG tablet TAKE 1 TABLET BY MOUTH TWO TIMES DAILY WITH MEALS 180 tablet 1   clopidogrel (PLAVIX) 75 MG tablet Take 1 tablet (75 mg total) by mouth daily. 30 tablet 5   Continuous Blood Gluc Receiver (DEXCOM G6 RECEIVER) DEVI Use as instructed to monitor blood sugar daily 1 each 0   Continuous Blood Gluc Sensor (DEXCOM G6 SENSOR) MISC Use as instructed to monitor blood sugar daily. Change every 10 days. 9 each 3   Continuous Blood Gluc Transmit (DEXCOM G6 TRANSMITTER) MISC Use as instructed to monitor blood sugar daily. Change every 90 days. 1 each 3   Crisaborole (EUCRISA) 2 % OINT Apply to affected area twice a day 60 g 0   diclofenac Sodium (VOLTAREN) 1 % GEL Apply 2 g topically 4 times daily as needed. 100 g 1   DULoxetine (CYMBALTA) 20 MG capsule TAKE 1 CAPSULE BY MOUTH ONCE A DAY 90 capsule 1   famotidine (PEPCID) 20 MG  tablet TAKE 1 TABLET BY MOUTH 2 TIMES DAILY 60 tablet 2   fexofenadine (ALLEGRA) 180 MG tablet Take 180 mg by mouth every evening.     fluticasone (FLONASE) 50 MCG/ACT nasal spray PLACE 1 SPRAY INTO BOTH NOSTRILS DAILY AS NEEDED FOR ALLERGIES OR RHINITIS. 16 g 1   furosemide (LASIX) 40 MG tablet Take 40 mg by mouth daily. Pt takes 80 mg in the morning and 40 mg in the evening.     gabapentin (NEURONTIN) 300 MG capsule Take 1 capsule (300 mg total) by mouth 2 (two) times daily. 60 capsule 5   Insulin Lispro Prot & Lispro (HUMALOG MIX 50/50 KWIKPEN) (50-50) 100 UNIT/ML Kwikpen Inject 60 Units into the skin daily with breakfast. 60 mL 3   Insulin Pen Needle (UNIFINE PENTIPS) 32G X 4 MM MISC USE AS DIRECTED DAILY 100 each 0   isosorbide mononitrate (IMDUR) 60 MG 24 hr tablet Take 1.5 tablets (90 mg total) by mouth daily. 135 tablet 3   leflunomide (ARAVA) 10 MG tablet Take 1 tablet by mouth daily. 30 tablet 5   Melatonin 10 MG TABS Take 10 mg by mouth at bedtime.     naloxone (NARCAN) nasal spray 4 mg/0.1 mL Place in nostril in the event of suspected overdose on Narcotic medication (Oxycodone) 1 each 0   nitroGLYCERIN (NITROSTAT) 0.4 MG SL tablet Place 1 tablet (0.4 mg total) under the tongue every 5 (five) minutes as needed for chest pain. 25 tablet 2   oxyCODONE-acetaminophen (PERCOCET/ROXICET) 5-325 MG tablet Take 1 tablet by mouth 4 (four) times daily.     oxymetazoline (AFRIN) 0.05 % nasal spray Place 1 spray into both nostrils 2 times daily. 30 mL 0   pimecrolimus (ELIDEL) 1 % cream Apply to Left 1st-3rd fingers daily as directed 60 g 0   potassium chloride SA (KLOR-CON M) 20 MEQ tablet Take 1 tablet by mouth daily. 30 tablet 11   Secukinumab (COSENTYX SENSOREADY PEN) 150 MG/ML SOAJ INJECT ONE PEN SUBCUTANEOUSLY EVERY 4 WEEKS. REFRIGERATE. ALLOW 15 TO 30 MINUTES AT ROOM TEMP PRIOR TO ADMINISTRATION. 3 mL 1   Tafamidis (VYNDAMAX) 61 MG CAPS Take 1 capsule by mouth daily. NEED FOLLOW UP APPOINTMENT  FOR ANYMORE REFILLS 30 capsule 0  tamsulosin (FLOMAX) 0.4 MG CAPS capsule Take 1 capsule (0.4 mg total) by mouth at bedtime. 90 capsule 0   tirzepatide (MOUNJARO) 7.5 MG/0.5ML Pen Inject 7.5 mg into the skin once a week. 6 mL 3   triamcinolone cream (KENALOG) 0.1 % APPLY TO THE AFFECTED AREA(S) TOPICALLY 2 TIMES DAILY AS DIRECTED 454 g 0   UNIFINE PENTIPS 32G X 4 MM MISC USE AS DIRECTED DAILY 100 each 0   glucose blood test strip USE TO TEST 4 TIMES DAILY. 400 strip 3   No current facility-administered medications for this visit.    Allergies:   Nsaids and Jardiance [empagliflozin]    Social History:  The patient  reports that he quit smoking about 13 years ago. His smoking use included cigarettes. He has a 5.00 pack-year smoking history. He has never used smokeless tobacco. He reports that he does not drink alcohol and does not use drugs.   Family History:  The patient's family history includes COPD in his father and mother; Diabetes in his father and mother; Heart Problems in his father; Hypertension in his father and mother; Lung cancer in his mother.    ROS:  General:no colds or fevers, no weight changes Skin:no rashes or ulcers HEENT:no blurred vision, no congestion CV:see HPI PUL:see HPI GI:no diarrhea constipation or melena, no indigestion GU:no hematuria, no dysuria MS:no joint pain, no claudication Neuro:no syncope, no lightheadedness Endo:+ diabetes, no thyroid disease  Wt Readings from Last 3 Encounters:  10/08/21 216 lb (98 kg)  10/02/21 214 lb 11.7 oz (97.4 kg)  09/24/21 212 lb (96.2 kg)     PHYSICAL EXAM: VS:  BP 132/88    Pulse 82    Ht 5' 5.25" (1.657 m)    Wt 216 lb (98 kg)    SpO2 95%    BMI 35.67 kg/m  , BMI Body mass index is 35.67 kg/m.  GEN: Well nourished, well developed in no acute distress HEENT: Normal NECK: No JVD; No carotid bruits LYMPHATICS: No lymphadenopathy CARDIAC:RRR, no murmurs, rubs, gallops RESPIRATORY:  Clear to auscultation without  rales, wheezing or rhonchi  ABDOMEN: Soft, non-tender, non-distended MUSCULOSKELETAL:  No edema; No deformity  SKIN: Warm and dry NEUROLOGIC:  Alert and oriented x 3 PSYCHIATRIC:  Normal affect   EKG:  EKG is not ordered today.  Recent Labs: 01/11/2021: ALT 8 04/11/2021: TSH 1.720 05/31/2021: Hemoglobin 12.4; Platelets 257 08/28/2021: BUN 14; Creatinine, Ser 1.41; Potassium 4.1; Sodium 145    Lipid Panel    Component Value Date/Time   CHOL 97 (L) 01/11/2021 1104   TRIG 172 (H) 01/11/2021 1104   HDL 27 (L) 01/11/2021 1104   CHOLHDL 3.6 01/11/2021 1104   CHOLHDL 2.6 06/10/2016 1253   VLDL 18 06/10/2016 1253   LDLCALC 41 01/11/2021 1104       Other studies Reviewed: Additional studies/ records that were reviewed today include:  Stress test.  05/14/21 .   Findings are consistent with prior myocardial infarction with peri-infarct ischemia. The study is intermediate risk.   No ST deviation was noted.   LV perfusion is abnormal. There is no evidence of ischemia. There is evidence of infarction. Defect 1: There is a medium defect with moderate reduction in uptake present in the apical to mid inferior and apex location(s) that is fixed. There is abnormal wall motion in the defect area. Consistent with infarction and peri-infarct ischemia.   Left ventricular function is abnormal. Global function is moderately reduced. Nuclear stress EF: 36 %. The left  ventricular ejection fraction is moderately decreased (30-44%). End diastolic cavity size is mildly enlarged. End systolic cavity size is mildly enlarged.   Prior study available for comparison from 01/01/2017. No changes compared to prior study. Nuclear stress EF: 44%. There was no ST segment deviation noted during stress. Defect 1: There is a small defect of severe severity present in the apical septal and apex location. This is a low risk study. The left ventricular ejection fraction is moderately decreased (30-44%).   Low risk stress nuclear  study with small prior distal septal/apical infarct; no ischemia; EF 44 with global hypokinesis and mild LVE.   Moderate size and intensity partially reversible inferoapical, apical and apical lateral perfusion defect suggestive of scar with minimal peri-infarct ischemia (SDS 3). LVEF 36% with distal apical, apical lateral and apical hypokinesis. This is an intermediate risk study. Compared to a prior study in 2018, the LVEF is lower (was 44%), however, the perfusion defect is similar.  Echo 08/21/20  IMPRESSIONS     1. Left ventricular ejection fraction, by estimation, is 50 to 55%. The  left ventricle has low normal function. The left ventricle has no regional  wall motion abnormalities. The left ventricular internal cavity size was  moderately to severely dilated.  There is moderate left ventricular hypertrophy. Left ventricular diastolic  parameters are indeterminate.   2. Right ventricular systolic function is normal. The right ventricular  size is normal. There is normal pulmonary artery systolic pressure.   3. The mitral valve is normal in structure. Trivial mitral valve  regurgitation. No evidence of mitral stenosis.   4. The aortic valve is tricuspid. Aortic valve regurgitation is not  visualized. No aortic stenosis is present.   5. The inferior vena cava is normal in size with greater than 50%  respiratory variability, suggesting right atrial pressure of 3 mmHg.   Comparison(s): Changes from prior study are noted.   Conclusion(s)/Recommendation(s): EF improved from prior study.   FINDINGS   Left Ventricle: Left ventricular ejection fraction, by estimation, is 50  to 55%. The left ventricle has low normal function. The left ventricle has  no regional wall motion abnormalities. The left ventricular internal  cavity size was moderately to  severely dilated. There is moderate left ventricular hypertrophy. Left  ventricular diastolic parameters are indeterminate.   Right  Ventricle: The right ventricular size is normal. No increase in  right ventricular wall thickness. Right ventricular systolic function is  normal. There is normal pulmonary artery systolic pressure. The tricuspid  regurgitant velocity is 2.39 m/s, and   with an assumed right atrial pressure of 3 mmHg, the estimated right  ventricular systolic pressure is 09.8 mmHg.   Left Atrium: Left atrial size was normal in size.   Right Atrium: Right atrial size was normal in size.   Pericardium: Trivial pericardial effusion is present.   Mitral Valve: The mitral valve is normal in structure. Trivial mitral  valve regurgitation. No evidence of mitral valve stenosis.   Tricuspid Valve: The tricuspid valve is normal in structure. Tricuspid  valve regurgitation is trivial. No evidence of tricuspid stenosis.   Aortic Valve: The aortic valve is tricuspid. Aortic valve regurgitation is  not visualized. No aortic stenosis is present.   Pulmonic Valve: The pulmonic valve was not well visualized. Pulmonic valve  regurgitation is not visualized.   Aorta: The aortic root, ascending aorta and aortic arch are all  structurally normal, with no evidence of dilitation or obstruction.   Venous: The inferior vena  cava is normal in size with greater than 50%  respiratory variability, suggesting right atrial pressure of 3 mmHg.   IAS/Shunts: The atrial septum is grossly normal.      LEFT VENTRICLE  PLAX 2D  LVIDd:         5.80 cm      Diastology  LVIDs:         4.00 cm      LV e' medial:    3.48 cm/s  LV PW:         1.30 cm      LV E/e' medial:  16.9  LV IVS:        1.40 cm      LV e' lateral:   5.66 cm/s  LVOT diam:     2.40 cm      LV E/e' lateral: 10.4  LV SV:         69  LV SV Index:   33  LVOT Area:     4.52 cm   ASSESSMENT AND PLAN:  1.  ASCAD -cath 06/11/2021 showing 10% ostial left circumflex, 75% mid LAD, 75% ostial D2 status post PCI of the mid LAD lesion and balloon angioplasty of the  diagonal lesion.  LVEDP was normal.   -He was started on DAPT with Plavix 75 mg daily and aspirin 81 mg daily.   -He denies any anginal symptoms -Continue prescription drug management with aspirin 81 mg daily, Plavix 75 mg daily, carvedilol 12.5 mg twice daily, high-dose statin Imdur 90 mg daily with as needed refills.    3.  NICM/Chronic combined systolic/diastolic CHF --Echo 25/9/56 LVEF 25-30%, Grade 1 DD, Trivial MR.  - Echo 2/20 40-45%  RV ok. No need for ICD currently.  - Echo 3/21 EF 55-60% -> complete recovery - Echo 05/16/20 EF 25-30% (drop in EF after recent illness) - Echo 08/22/19 w/ improved EF 45-50%.  RV normal.   - Very mild non-obstructive CAD on cath 05/19/18,  NICM  - PYP scan 11/19 read as + for Cardiac amyloidosis. But reviewed by [name], and not 387% certain it is positive. Genetic testing negative. Myeloma Panel with MGUS, no myeloma. Now on Tafamadis.  - Repeat PYP 1/21. Reviewed personally. Read as equivocal. Felt negative by [name]. - Ideally, would get cMRI, but limited due to claustrophobia.  -Diagnosed in past with improvement of  EF to 50-55% in 08/2020 and now drop in EF on stress test to 38% 05/2021. -did not tolerate hydralazine or Entresto due to and dizziness and AKI -Now status post revascularization of the LAD and diagonal. -Repeat 2D echo 08/27/2021 showed mild LV dysfunction with EF 43% with mild LVH which has worsened compared to prior echo 08/2020 where EF was 50 to 55% -He does not appear volume overloaded on exam today -Continue prescription drug management with carvedilol 12.5 mg twice daily, Lasix 80 mg qam and 40mg  qpm, Imdur 90 mg daily and Tafmidis 61mg  daily with PRN refills -INTOLERANT to Hydralazine due to low BP and HAs -followed by AHF  -he was supposed to follow-up last May and never did so I will get him back into see them  4.  CKD 3  -last Cr was 1.39 prior to cath -Serum creatinine was 1.4 and potassium 4.1 on 08/28/2021  5.   HLD  -LDL goal is less than 70  -Check FLP and ALT -Continue prescription drug management Lipitor 40 mg daily with as needed refills  6.  DM-2  -PCP  following on insulin  7.  OSA -followed by Dr. Annamaria Boots with pulmonary  8.  HTN  -BP is well controlled on exam today -Continue prescription drug management with Imdur 90 mg daily, carvedilol 12.5 mg twice daily with as needed refills  9.  PAF   -hx of RFCA in 2017  -He is maintaining normal sinus rhythm on exam today -He denies any palpitations since I saw him last -His Xarelto was stopped due to anemia  Current medicines are reviewed with the patient today.  The patient Has no concerns regarding medicines.  Signed, [name], MD  10/08/2021 3:17 PM    [facility] [place], [place], [place] Phone: [phone]; Fax: [phone]

## 2021-10-08 NOTE — Patient Instructions (Signed)
Medication Instructions:  Your physician recommends that you continue on your current medications as directed. Please refer to the Current Medication list given to you today.  *If you need a refill on your cardiac medications before your next appointment, please call your pharmacy*   Lab Work: Lipid, ALT and BMET when you are fasting.    If you have labs (blood work) drawn today and your tests are completely normal, you will receive your results only by: [place] (if you have MyChart) OR A paper copy in the mail If you have any lab test that is abnormal or we need to change your treatment, we will call you to review the results.   Testing/Procedures: None   Follow-Up:  [name] would like for you to follow up with HF team.   At [facility], you and your health needs are our priority.  As part of our continuing mission to provide you with exceptional heart care, we have created designated Provider Care Teams.  These Care Teams include your primary Cardiologist (physician) and Advanced Practice Providers (APPs -  Physician Assistants and Nurse Practitioners) who all work together to provide you with the care you need, when you need it.  We recommend signing up for the patient portal called "MyChart".  Sign up information is provided on this After Visit Summary.  MyChart is used to connect with patients for Virtual Visits (Telemedicine).  Patients are able to view lab/test results, encounter notes, upcoming appointments, etc.  Non-urgent messages can be sent to your provider as well.   To learn more about what you can do with MyChart, go to [email or web address].    Your next appointment:   1 year(s)  The format for your next appointment:   In Person  Provider:   [name], MD     Other Instructions

## 2021-10-08 NOTE — Addendum Note (Signed)
Addended by: Loren Racer on: 10/08/2021 03:38 PM   Modules accepted: Orders

## 2021-10-10 ENCOUNTER — Other Ambulatory Visit: Payer: Medicare Other

## 2021-10-10 ENCOUNTER — Other Ambulatory Visit: Payer: Self-pay

## 2021-10-10 DIAGNOSIS — M79661 Pain in right lower leg: Secondary | ICD-10-CM | POA: Diagnosis not present

## 2021-10-10 DIAGNOSIS — M25512 Pain in left shoulder: Secondary | ICD-10-CM | POA: Diagnosis not present

## 2021-10-10 DIAGNOSIS — E1143 Type 2 diabetes mellitus with diabetic autonomic (poly)neuropathy: Secondary | ICD-10-CM | POA: Diagnosis not present

## 2021-10-10 DIAGNOSIS — M25511 Pain in right shoulder: Secondary | ICD-10-CM | POA: Diagnosis not present

## 2021-10-10 DIAGNOSIS — I5042 Chronic combined systolic (congestive) and diastolic (congestive) heart failure: Secondary | ICD-10-CM | POA: Diagnosis not present

## 2021-10-10 DIAGNOSIS — G890 Central pain syndrome: Secondary | ICD-10-CM | POA: Diagnosis not present

## 2021-10-10 DIAGNOSIS — M2550 Pain in unspecified joint: Secondary | ICD-10-CM | POA: Diagnosis not present

## 2021-10-10 DIAGNOSIS — I480 Paroxysmal atrial fibrillation: Secondary | ICD-10-CM | POA: Diagnosis not present

## 2021-10-10 DIAGNOSIS — M5412 Radiculopathy, cervical region: Secondary | ICD-10-CM | POA: Diagnosis not present

## 2021-10-10 DIAGNOSIS — T50905S Adverse effect of unspecified drugs, medicaments and biological substances, sequela: Secondary | ICD-10-CM | POA: Diagnosis not present

## 2021-10-10 DIAGNOSIS — M545 Low back pain, unspecified: Secondary | ICD-10-CM | POA: Diagnosis not present

## 2021-10-10 DIAGNOSIS — Z79891 Long term (current) use of opiate analgesic: Secondary | ICD-10-CM | POA: Diagnosis not present

## 2021-10-10 DIAGNOSIS — Z79899 Other long term (current) drug therapy: Secondary | ICD-10-CM | POA: Diagnosis not present

## 2021-10-10 DIAGNOSIS — M542 Cervicalgia: Secondary | ICD-10-CM | POA: Diagnosis not present

## 2021-10-10 DIAGNOSIS — G894 Chronic pain syndrome: Secondary | ICD-10-CM | POA: Diagnosis not present

## 2021-10-10 DIAGNOSIS — I2510 Atherosclerotic heart disease of native coronary artery without angina pectoris: Secondary | ICD-10-CM

## 2021-10-10 DIAGNOSIS — M79662 Pain in left lower leg: Secondary | ICD-10-CM | POA: Diagnosis not present

## 2021-10-10 DIAGNOSIS — L409 Psoriasis, unspecified: Secondary | ICD-10-CM | POA: Diagnosis not present

## 2021-10-10 DIAGNOSIS — I493 Ventricular premature depolarization: Secondary | ICD-10-CM | POA: Diagnosis not present

## 2021-10-10 DIAGNOSIS — M1A09X0 Idiopathic chronic gout, multiple sites, without tophus (tophi): Secondary | ICD-10-CM | POA: Diagnosis not present

## 2021-10-10 LAB — LIPID PANEL
Chol/HDL Ratio: 4.5 ratio (ref 0.0–5.0)
Cholesterol, Total: 108 mg/dL (ref 100–199)
HDL: 24 mg/dL — ABNORMAL LOW (ref >39)
LDL Chol Calc (NIH): 55 mg/dL (ref 0–99)
Triglycerides: 171 mg/dL — ABNORMAL HIGH (ref 0–149)
VLDL Cholesterol Cal: 29 mg/dL (ref 5–40)

## 2021-10-10 LAB — BASIC METABOLIC PANEL
BUN/Creatinine Ratio: 9 — ABNORMAL LOW (ref 10–24)
BUN: 16 mg/dL (ref 8–27)
CO2: 27 mmol/L (ref 20–29)
Calcium: 8.3 mg/dL — ABNORMAL LOW (ref 8.6–10.2)
Chloride: 105 mmol/L (ref 96–106)
Creatinine, Ser: 1.82 mg/dL — ABNORMAL HIGH (ref 0.76–1.27)
Glucose: 95 mg/dL (ref 70–99)
Potassium: 4.1 mmol/L (ref 3.5–5.2)
Sodium: 144 mmol/L (ref 134–144)
eGFR: 40 mL/min/{1.73_m2} — ABNORMAL LOW (ref >59)

## 2021-10-10 LAB — CBC
Hematocrit: 38.4 % (ref 37.5–51.0)
Hemoglobin: 13 g/dL (ref 13.0–17.7)
MCH: 30.7 pg (ref 26.6–33.0)
MCHC: 33.9 g/dL (ref 31.5–35.7)
MCV: 91 fL (ref 79–97)
Platelets: 262 10*3/uL (ref 150–450)
RBC: 4.23 x10E6/uL (ref 4.14–5.80)
RDW: 13.1 % (ref 11.6–15.4)
WBC: 7.4 10*3/uL (ref 3.4–10.8)

## 2021-10-10 LAB — ALT: ALT: 11 IU/L (ref 0–44)

## 2021-10-11 ENCOUNTER — Telehealth: Payer: Self-pay | Admitting: Cardiology

## 2021-10-11 ENCOUNTER — Other Ambulatory Visit (HOSPITAL_COMMUNITY): Payer: Self-pay

## 2021-10-11 DIAGNOSIS — E7800 Pure hypercholesterolemia, unspecified: Secondary | ICD-10-CM

## 2021-10-11 MED ORDER — ROSUVASTATIN CALCIUM 40 MG PO TABS
40.0000 mg | ORAL_TABLET | Freq: Every day | ORAL | 3 refills | Status: AC
  Filled 2021-10-11: qty 90, 90d supply, fill #0

## 2021-10-11 NOTE — Telephone Encounter (Signed)
The patient has been notified of the result and verbalized understanding.  All questions (if any) were answered. ?Antonieta Iba, RN 10/11/2021 2:29 PM  ? ?

## 2021-10-11 NOTE — Telephone Encounter (Signed)
-----   Message from Sueanne Margarita, MD sent at 10/10/2021  5:49 PM EST ----- ?TAGS not at goal.  Change atorvastatin to rosuvastatin 40mg  daily and repeat FLP and ALT In 6 weeks ?

## 2021-10-11 NOTE — Telephone Encounter (Signed)
Patient is returning call to discuss lab results. 

## 2021-10-13 DIAGNOSIS — J9601 Acute respiratory failure with hypoxia: Secondary | ICD-10-CM | POA: Diagnosis not present

## 2021-10-14 ENCOUNTER — Telehealth: Payer: Self-pay

## 2021-10-14 ENCOUNTER — Other Ambulatory Visit (HOSPITAL_COMMUNITY): Payer: Self-pay

## 2021-10-14 DIAGNOSIS — N183 Chronic kidney disease, stage 3 unspecified: Secondary | ICD-10-CM

## 2021-10-14 DIAGNOSIS — I10 Essential (primary) hypertension: Secondary | ICD-10-CM

## 2021-10-14 DIAGNOSIS — I5042 Chronic combined systolic (congestive) and diastolic (congestive) heart failure: Secondary | ICD-10-CM

## 2021-10-14 MED ORDER — FUROSEMIDE 40 MG PO TABS
40.0000 mg | ORAL_TABLET | Freq: Two times a day (BID) | ORAL | 3 refills | Status: AC
  Filled 2021-10-14: qty 180, 90d supply, fill #0

## 2021-10-14 NOTE — Telephone Encounter (Signed)
Spoke with the patient and advised on changing lasix to 40 mg BID. Repeat labs have been scheduled. ?

## 2021-10-14 NOTE — Telephone Encounter (Signed)
-----   Message from Sueanne Margarita, MD sent at 10/14/2021 12:19 PM EST ----- ?SCr has bumped - decrease Lasix to '40mg'$  BID and repeat BMET on Friday ?----- Message ----- ?From: Isaiah Serge, NP ?Sent: 10/11/2021  12:27 PM EST ?To: Sueanne Margarita, MD, Loren Racer, RN ? ?Dt. Turner I believe you ordered this.   ? ?Cecilie Kicks, FNP-C ?  ? ? ?

## 2021-10-15 ENCOUNTER — Ambulatory Visit: Payer: Medicare Other | Attending: Urology

## 2021-10-15 ENCOUNTER — Other Ambulatory Visit: Payer: Self-pay

## 2021-10-15 DIAGNOSIS — M6281 Muscle weakness (generalized): Secondary | ICD-10-CM | POA: Insufficient documentation

## 2021-10-15 DIAGNOSIS — R279 Unspecified lack of coordination: Secondary | ICD-10-CM | POA: Diagnosis not present

## 2021-10-15 DIAGNOSIS — M62838 Other muscle spasm: Secondary | ICD-10-CM | POA: Diagnosis not present

## 2021-10-15 NOTE — Therapy (Addendum)
[facility] @ [place], [place], [address] Phone: [phone]   Fax:  [phone]  Physical Therapy Treatment  Patient Details  Name: Marco [name] MRN: [id number] Date of Birth: 26-Mar-1956 Referring Provider (PT): Robley Fries, MD   Encounter Date: 10/15/2021   PT End of Session - 10/15/21 1530     Visit Number 3    Date for PT Re-Evaluation 11/26/21    Authorization Type UHC Medicare    Progress Note Due on Visit 10    PT Start Time 1531    PT Stop Time 1610    PT Time Calculation (min) 39 min    Activity Tolerance Patient tolerated treatment well    Behavior During Therapy [facility] for tasks assessed/performed             Past Medical History:  Diagnosis Date   Benign colon polyp 08/01/2013   [facility] in [place]. large base tranverse colon polyp was biopsied. polyp was benign with minimal surface hyperplastic change.   Chronic combined systolic and diastolic CHF (congestive heart failure) (HCC)    Chronic pain    CKD (chronic kidney disease), stage III (HCC)    Diabetes mellitus without complication ([place])    Diverticulosis    Esophageal hiatal hernia 07/29/2013   confirmed on EGD    Esophageal stricture    Essential hypertension    Gastritis 07/29/2013   confirmed on EGD, bx done an negative for intestinal metaplasia, dsyplasia or H. pylori. normal gastric emptying study done 07/13/2013.   GERD (gastroesophageal reflux disease)    Morbid obesity (HCC)    Non-obstructive CAD    Coronary CTA 04/2021 showed coronary calcium score of 535 which is 93rd % for age and sex matched controls.  There is mild CAD with mild calcified plaque in the distal LAD 25-49% and also in the ostial D3.  There is minimal calcified plaque in the mid LCx and mild mixed plaque in the pRCA with 25-49% stenosis.   Persistent atrial fibrillation ([place])    a. 02/2013 s/p rfca in [place], [place]-->prev on Xarelto,  d/c'd 2/2 anemia, ? GIB.   Rheumatoid arthritis ([place])    Sigmoid diverticulosis 08/01/2013   confirmed on colonscopy. record scanned into chart    Past Surgical History:  Procedure Laterality Date   CARDIAC CATHETERIZATION  05/2014   ablation for atrial fibrillation   CARDIAC CATHETERIZATION N/A 11/16/2015   Procedure: Left Heart Cath and Coronary Angiography;  Surgeon: Leonie Man, MD;  Location: [facility];  Service: Cardiovascular;  Laterality: N/A;   COLON RESECTION  09/2013   due to large, abnormal polpy. non cancerous per patient.    COLON SURGERY  09/2014   colon resection    CORONARY STENT INTERVENTION N/A 06/11/2021   Procedure: CORONARY STENT INTERVENTION;  Surgeon: Jettie Booze, MD;  Location: [place] [facility];  Service: Cardiovascular;  Laterality: N/A;   EXTRACORPOREAL SHOCK WAVE LITHOTRIPSY Left 01/03/2021   Procedure: EXTRACORPOREAL SHOCK WAVE LITHOTRIPSY (ESWL);  Surgeon: Robley Fries, MD;  Location: [facility];  Service: Urology;  Laterality: Left;   IR PERC PLEURAL DRAIN W/INDWELL CATH W/IMG GUIDE  01/07/2020   LEFT HEART CATH AND CORONARY ANGIOGRAPHY N/A 05/19/2018   Procedure: LEFT HEART CATH AND CORONARY ANGIOGRAPHY;  Surgeon: Wellington Hampshire, MD;  Location: [place] [facility];  Service: Cardiovascular;  Laterality: N/A;   LEFT HEART CATH AND CORONARY ANGIOGRAPHY  N/A 06/11/2021   Procedure: LEFT HEART CATH AND CORONARY ANGIOGRAPHY;  Surgeon: Jettie Booze, MD;  Location: [place] [facility];  Service: Cardiovascular;  Laterality: N/A;    There were no vitals filed for this visit.   Subjective Assessment - 10/15/21 1532     Subjective Pt reports no changes since last visit. He istrying to drink 1 16oz bottle of water a day. He has misplaced exericse sheet and would like another one.    Patient Stated Goals To decrease pain with urination    Currently in Pain? No/denies    Multiple Pain Sites No                                [facility] Adult PT Treatment/Exercise - 10/15/21 0001       Lumbar Exercises: Stretches   Lower Trunk Rotation Limitations 30x    Piriformis Stretch Right;Left;1 rep;60 seconds      Lumbar Exercises: Supine   Bridge 20 reps    Other Supine Lumbar Exercises Bent knee fall out 10x bil; supine march 2 x 10; hip adduction isometric 10 x 5 sec    Other Supine Lumbar Exercises Supine march 2 x 10      Lumbar Exercises: Sidelying   Clam Right;Left;20 reps    Other Sidelying Lumbar Exercises Open books 12x bil      Manual Therapy   Manual Therapy Soft tissue mobilization;Myofascial release    Soft tissue mobilization abdominal soft tissue mobilization with focus over bladder to decrease restriction    Myofascial Release abdominal fascial release with pull up over bladder and surrounding structures; P/ROM to hip flexors with manual stretching performed in bil sidelying                     PT Education - 10/15/21 1556     Education Details Pt education performed on increasing water intake to 2 16oz bottles of water a day for a minimum; furthe reducation performed on all exericse progressions.    Person(s) Educated Patient    Methods Explanation;Demonstration;Tactile cues;Verbal cues;Handout    Comprehension Verbalized understanding              PT Short Term Goals - 09/17/21 1236       PT SHORT TERM GOAL #1   Title Pt will be independent with HEP.    Time 4    Period Weeks    Status New    Target Date 10/15/21      PT SHORT TERM GOAL #2   Title Pt will improve all lumbopelvic A/ROM by 25% in order to help improve abdominal/pelvic floor muscle mobility and circulation.    Time 4    Period Weeks    Status New    Target Date 10/15/21      PT SHORT TERM GOAL #3   Title Pt will increase water intake to 4 8oz glasses a day to help decrease pain with urination.    Time 4    Period Weeks    Status New    Target Date 10/15/21                PT Long Term Goals - 09/17/21 1238       PT LONG TERM GOAL #1   Title Pt will be independent with advanced HEP.    Time 10    Period Weeks    Status New  Target Date 12/10/21      PT LONG TERM GOAL #2   Title Pt will demonstrate increase in all impaired hip strength by 1 muscle grade in order to demonstrate improved lumbopelvic support and increase functional ability.    Time 10    Period Weeks    Status New    Target Date 11/26/21      PT LONG TERM GOAL #3   Title Pt will demonstrate normal pelvic floor muscle tone and A/ROM, able to achieve 4/5 strength with contractions and 10 sec endurance, in order to provide appropriate lumbopelvic support in functional activities.    Time 10    Period Weeks    Status New    Target Date 11/26/21      PT LONG TERM GOAL #4   Title Pt will report less than 2/10 with urination.    Time 10    Period Weeks    Status New    Target Date 11/26/21                   Plan - 10/15/21 1531     Clinical Impression Statement Pt is not seeing any improvements yet, but has also not been able to work on HEP at all in the last several weeks. Manual techniques performed to Lt side and abdomen to help reduce scar tissue and myofascial restriction that could be impacting dysuria. He did demosntrate signficant tightness throughout Lt flank and over bladder; good improvements throughout session. He was educated on these findings; believe that chornic back pain could also potentially be contributing to restriction present throughout posterior/anterior core. Good tolerance to all mobility and gentle strengthenign to help increase movement and ciruclation in core. He will continue to benefit from skilled PT intervention in order to decrease dysurea and improve QOL.    PT Treatment/Interventions ADLs/Self Care Home Management;Biofeedback;Electrical Stimulation;Cryotherapy;Moist Heat;Therapeutic activities;Therapeutic exercise;Neuromuscular  re-education;Manual techniques;Patient/family education;Scar mobilization;Passive range of motion;Dry needling;Spinal Manipulations    PT Next Visit Plan Plan to perform abdominal manual techniques to improve mobility and tenderness; progress mobility and gentle strengthening exercises; diaphragmatic breathing; push water intake.    PT Home Exercise Plan [id number]    Consulted and Agree with Plan of Care Patient             Patient will benefit from skilled therapeutic intervention in order to improve the following deficits and impairments:  Abnormal gait, Increased fascial restricitons, Impaired tone, Decreased endurance, Pain, Decreased activity tolerance, Decreased scar mobility, Hypomobility, Decreased mobility, Decreased strength, Decreased range of motion, Decreased coordination  Visit Diagnosis: Muscle weakness (generalized)  Other muscle spasm  Unspecified lack of coordination     Problem List Patient Active Problem List   Diagnosis Date Noted   Immunodeficiency, unspecified ([place]) 08/13/2021   Abnormal stress test    Cellulitis of finger of left hand 10/20/2020   Nasal congestion 10/20/2020   History of Clostridium difficile infection 10/20/2020   Cough 10/14/2020   Healthcare maintenance 06/15/2020   Perinephric abscess 05/13/2020   Pyohydronephrosis 05/13/2020   Vitamin B12 deficiency 02/19/2020   Elevated LFTs    Pleural effusion    Weakness    Loculated pleural effusion    Complex renal cyst    Urethral stricture 12/30/2019   Nephrolithiasis 12/30/2019   Acute febrile illness 12/30/2019   Sepsis ([place]) 12/27/2019   Severe sepsis ([place]) 28/31/5176   Acute metabolic encephalopathy 16/02/3709   Sepsis due to Gram negative bacteria ([place]) 12/09/2019   Rhinitis 10/16/2019  Gastroesophageal reflux disease without esophagitis 08/31/2018   Muscle spasm 08/31/2018   Cardiac amyloidosis (HCC)    Hypokalemia    Acute renal failure superimposed on stage 3 chronic  kidney disease ([place]) 08/27/2017   Hyperlipidemia 12/23/2016   Chronic combined systolic and diastolic CHF (congestive heart failure) ([place]) 09/09/2016   Diabetic polyneuropathy associated with type 2 diabetes mellitus ([place]) 06/10/2016   Vision changes 06/02/2016   Myalgia and myositis 03/31/2016   Shortness of breath 02/09/2016   Chronic gouty arthritis 11/29/2015   LBP (low back pain) 11/27/2015   Arthralgia of multiple joints 11/27/2015   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[place]) 11/27/2015   Chest pain of uncertain etiology 44/10/4740   Sinusitis, chronic 10/16/2015   Chronic insomnia 10/16/2015   New onset of headaches after age 6 09/20/2015   OSA (obstructive sleep apnea) 07/12/2015   Low serum testosterone level 05/18/2015   Depression 05/16/2015   Morbid obesity due to excess calories (HCC)    Chronic pain    Type 2 diabetes mellitus with complication, with long-term current use of insulin (HCC)    Rheumatoid arthritis ([place]) 02/05/2015   Elevated troponin 12/24/2014   CAD (coronary artery disease) 12/24/2014   Atrial fibrillation ([place]) 12/24/2014   Hypertension 12/24/2014   Chronic kidney disease 12/24/2014   PUD (peptic ulcer disease) 12/24/2014    Heather Roberts, PT, DPT03/07/234:11 PM   [facility] @ [place], [place], [address] Phone: [phone]   Fax:  [phone]  Name: Marco [name] MRN: [id number] Date of Birth: March 18, 1956  PHYSICAL THERAPY DISCHARGE SUMMARY  Visits from Start of Care: 3  Current functional level related to goals / functional outcomes: NA   Remaining deficits: NA   Education / Equipment: NA   Patient agrees to discharge. Patient goals were  NA . Patient is being discharged due to  patient being deceased.

## 2021-10-16 ENCOUNTER — Telehealth: Payer: Self-pay | Admitting: Internal Medicine

## 2021-10-17 ENCOUNTER — Telehealth: Payer: Self-pay | Admitting: Cardiology

## 2021-10-17 NOTE — Telephone Encounter (Signed)
Pt c/o Shortness Of Breath: STAT if SOB developed within the last 24 hours or pt is noticeably SOB on the phone ? ?1. Are you currently SOB (can you hear that pt is SOB on the phone)? Yes, but cannot hear over the phone  ? ?2. How long have you been experiencing SOB? Started yesterday  ? ?3. Are you SOB when sitting or when up moving around? Moving around  ? ?4. Are you currently experiencing any other symptoms? No  ? ? ?Patient is calling stating he started having SOB yesterday since decreasing his Lasix as advised. He is unsure if it is due to the lasix decrease or stopping atorvastatin and starting rosuvastatin. Please advise.  ?

## 2021-10-17 NOTE — Telephone Encounter (Signed)
Called patient but he did not answer. Left message for him to call.  ?

## 2021-10-17 NOTE — Telephone Encounter (Signed)
I have called the patient and gave him the number to call Dr. [name] Parker office and he can set up an appointment. He did not have any questions.  ?

## 2021-10-17 NOTE — Telephone Encounter (Signed)
Pt called stating that he is experiencing increased SOB x 2 days, and since beginning Crestor. States he began crestor on Tuesday this week and SOB began on Wednesday. Pt previously on Atorvastatin, so advised him that these two drugs are in the same class and it likely is stemming from his decreased Lasix dose.  ?October 14, 2021 ?Antonieta Iba, RN ?  ?Note ?Spoke with the patient and advised on changing lasix to 40 mg BID. Repeat labs have been scheduled.  ?  ?Pt states that he mistakenly told Dr [name] that he was taking '80mg'$  each morning and '40mg'$  at night, but realized that the truly has only been taking '40mg'$  lasix each morning and again in the evening for at least 5-6 months now. Proceeds to explain that more than a few times, he has even skipped or not taken the evening dose of Lasix and still has not experienced SOB like this before. Pt states he is fine at rest, but quickly becomes SOB and experiences chest tightness once he gets up to walk for "any distance." These feelings quickly subside once he rests. Pt states that his weight yesterday was 215lb, weighed himself on the phone at time of call and says current weight is 215.6lb, also denies swelling. Denies CP, no pain in jaw/arm/ pain when breathing. Advised pt that I will send message to pharmacist to review and also to Dr [name]. ED precautions given. ? ?

## 2021-10-17 NOTE — Telephone Encounter (Signed)
If patient was previously taking Lasix '40mg'$  BID then he did not actually have a dose decrease.  If patient is experiencing such severe SOB, recommend reporting to ER ?

## 2021-10-18 ENCOUNTER — Other Ambulatory Visit: Payer: Medicare Other | Admitting: *Deleted

## 2021-10-18 ENCOUNTER — Other Ambulatory Visit: Payer: Self-pay

## 2021-10-18 DIAGNOSIS — N183 Chronic kidney disease, stage 3 unspecified: Secondary | ICD-10-CM | POA: Diagnosis not present

## 2021-10-18 DIAGNOSIS — I10 Essential (primary) hypertension: Secondary | ICD-10-CM | POA: Diagnosis not present

## 2021-10-18 DIAGNOSIS — I5042 Chronic combined systolic (congestive) and diastolic (congestive) heart failure: Secondary | ICD-10-CM

## 2021-10-18 NOTE — Telephone Encounter (Signed)
Left message to call back  

## 2021-10-19 LAB — BASIC METABOLIC PANEL
BUN/Creatinine Ratio: 9 — ABNORMAL LOW (ref 10–24)
BUN: 18 mg/dL (ref 8–27)
CO2: 22 mmol/L (ref 20–29)
Calcium: 8.4 mg/dL — ABNORMAL LOW (ref 8.6–10.2)
Chloride: 108 mmol/L — ABNORMAL HIGH (ref 96–106)
Creatinine, Ser: 2.09 mg/dL — ABNORMAL HIGH (ref 0.76–1.27)
Glucose: 142 mg/dL — ABNORMAL HIGH (ref 70–99)
Potassium: 4 mmol/L (ref 3.5–5.2)
Sodium: 147 mmol/L — ABNORMAL HIGH (ref 134–144)
eGFR: 34 mL/min/{1.73_m2} — ABNORMAL LOW (ref >59)

## 2021-10-21 ENCOUNTER — Other Ambulatory Visit (HOSPITAL_COMMUNITY): Payer: Self-pay

## 2021-10-21 DIAGNOSIS — R404 Transient alteration of awareness: Secondary | ICD-10-CM | POA: Diagnosis not present

## 2021-10-21 DIAGNOSIS — I469 Cardiac arrest, cause unspecified: Secondary | ICD-10-CM | POA: Diagnosis not present

## 2021-10-21 DIAGNOSIS — Z743 Need for continuous supervision: Secondary | ICD-10-CM | POA: Diagnosis not present

## 2021-10-21 DIAGNOSIS — I499 Cardiac arrhythmia, unspecified: Secondary | ICD-10-CM | POA: Diagnosis not present

## 2021-10-21 DIAGNOSIS — R079 Chest pain, unspecified: Secondary | ICD-10-CM | POA: Diagnosis not present

## 2021-10-22 ENCOUNTER — Ambulatory Visit: Payer: Medicare Other

## 2021-10-22 ENCOUNTER — Telehealth: Payer: Self-pay | Admitting: Internal Medicine

## 2021-10-22 ENCOUNTER — Telehealth: Payer: Self-pay | Admitting: Cardiology

## 2021-10-22 NOTE — Telephone Encounter (Signed)
Patient's wife called to let us know that the patient passed away yesterday. ?

## 2021-10-22 NOTE — Telephone Encounter (Signed)
Phone call placed to patient's wife this evening. ?I left a voicemail message stating who I am and that I received information through the cardiologist that the patient had passed.  Let her know that I was calling to express my condolences. ?

## 2021-10-22 NOTE — Telephone Encounter (Signed)
Patient's wife returning call. 

## 2021-10-23 ENCOUNTER — Telehealth: Payer: Self-pay | Admitting: Internal Medicine

## 2021-10-23 NOTE — Telephone Encounter (Signed)
PC placed to pt's wife, Mrs. Hoffmann, this evening. ?I expressed my condolences to her and her family on the death of Mr. Cuff. ?I inquired what at happened to him.  She stated that he was complaining of SOB with minimal activity for 3-4 days prior to his passing.  He did not have any cough or fever. He did not c/o CP.  He was started on a different statin med a few days before and she wondered whether that was causing the SOB so she held it for a few days.  She states he had called Dr. Theodosia Blender office to report the SOB but had not heard back.   ?She left him at home on 09/23/2021 around 2 p.m to go pick up 2 of her their grandchildren.  She tried calling him on her way back home but he did not answer.  She walked in the house with the 2 grandchildren around 2:50 p.m to find him unresponsive on the floor.  She called EMS and started CPR until they got there but he was dead. ?I told her that it sound like he had a flare/decompensation of CHF and/or heart attack.   ?Again I expressed my sincere condolences. ? ?

## 2021-10-23 NOTE — Progress Notes (Incomplete)
?  ? ?Advanced Heart Failure Clinic Note ? ? ?Date:  10/23/2021  ? ?ID:  Marco [name], DOB 1956-02-01, MRN [id number]  Location: Home  ?Provider location: [facility] ?Type of Visit: Established patient ? ?PCP:  [name], MD  ?Cardiologist:  [name], MD ?Primary HF: [place] ? ?Chief Complaint: Heart Failure follow-up ?  ?History of Present Illness: ? ?Marco [name] is a 66 y.o. 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 ?  ?He was seen by [name] in 8/19 for SOB and found to have a drop in EF to 35-40% with inferior hypokinesis. Cardiac cath 10/19 showed mild nonobstructive CAD  LVEDP 28, EF 25-30%.  ?  ?PYP 11/19  (grade 2, H/CLL equal 1.94) read as strongly suggestive TTR amyloidosis. However [name] personally reviewed and did not think study was positive. Myleloma panel with MGUS, discussed with [name] who recommend follow up labwork for common, non-pathologic findings. Did not think he had myeloma ?  ?Repeat PYP 12/20 H/CLL = 1.10 The study is equivocal for TTR amyloidosis (visual score of 1 or H/CL ratio 1-1.5).  ?  ?Echo 2/20 EF 40-45% RV normal  ?  ?Echo 3/21 EF 55-60%  ?  ?Admitted 4/21 for urosepsis and AKI with peri-nephric abscess. Creatinine 1.5 -> 5.6. Meds held. Hydrated and creatinine 1.7 on d/c.  ? ?Readmitted 5/21 for pyelonephritis. Entresto stopped. Had drain placed. ? ?CT 05/18/20 with large capsular hematoma of R kidney and hepatic abscess ? ?Repeat echo 10/21 EF 25-30% ? ?In 10/21, we restarted Entresto. Had labs with PCP and creatinine back up 1.3 -> 3.1 so Entresto held and lasix cut back to 80 daily.   ? ?Last clinic visit 1/22, Echo was repeated, LVEF ~ 45-50% RV ok. BP was mildly elevated and he was restarted on  hydralazine 25 tid and Imdur 30 daily, however did not tolerate due to HAs and dizziness. Meds were discontinued. A ? ?Had f/u 4/22, he was fluid overloaded. ReDs was elevated at 41%. Lasix increased to 80 mg qam/ 40 mg qpm. We also discussed retrial of anticoagulation but he refused. We also discussed referral to EP for Watchman's device but he was undecided and needed time to consider. He was instructed to return back in 4 weeks for f/u but failed to do so. Not seen back in the [facility] since that time. Has been seen since by [facility].  ? ?He was admitted 11/22 for CP. Had abnormal NST. Went for [facility] which showed 75% mLAD, treated w/ PCI + DES, as well as 75% Ost 2nd Diag lesion treated w/ balloon angioplasty. RCA and LCx w/ minimal disease. Echo not done during hospitalization. [name] ordered echo 1/23 which showed mildly reduced LVEF, 43%. RV normal. Had f/u w/ [name] 10/08/21. Given drop in EF, she recommended he f/u here in the [facility].  ? ?He presents today for f/u  ? ? ? ? ? ? ?Studies:  ?  ?LHC 11/2015 showed minimal CAD, mild calcification noted extraluminally, normal LVF, normal LVEDP. CPX 06/2016 with limited interpretation due to submaximal effort during exercise, difficult to interpret, also of note was very intolerant to discomfort from several processes during the CPX procedure today (i.e. motion on bike, electrode removal, etc).  ?-  Echo 09/03/16 EF 45-50% with global HK, grade 2 DD, elevated LV filling pressure. CTA 09/02/16 was negative for PE.  ?- Nuclear stress test 12/2016 was done for atypical CP showing small defect of severe severity in apical septal and apex location, no ischemia, EF 44% - suspected artifact.  ?- Echo 01/20/17 EF 50-55%, hypokinesis of apical inferoseptal myocardium, grade 1 diastolic dysfunction, mild MR, no sig change from prior.  ?- Event monitor 12/2016 showed NSR with occasional PVCs, otherwise benign.  ?- ETT 02/2017 was nondiagnostic due to 20 seconds of exercise and severe  SOB. ?-Cardiac cath 05/19/18 showed mild nonobstructive CAD (see below), mod-severely elevated LVEDP 28, EF 25-30%. ?- Echo 05/19/18 LVEF 25-30%, Grade 1 DD, Trivial MR.  ?- PYP 11/19 Visual and quantitative assessment (grade 2, H/CLL equal 1.94) are strongly suggestive of transthyretin amyloidosis. ?- Echo 1/22, LVEF 45-50%. RV ok.  ? ? ?LHC 05/19/18 ?- Ost 2nd Diag lesion is 35% stenosed. ?Prox LAD to Mid LAD lesion is 20% stenosed. ?Ost Cx to Prox Cx lesion is 10% stenosed. ?There is severe left ventricular systolic dysfunction. ?LV end diastolic pressure is moderately elevated. ?The left ventricular ejection fraction is 25-35% by visual estimate. ?- LVEDP 28 ? ?Cardiac Catheterization 06/11/2021: ?  Ost Cx to Prox Cx lesion is 10% stenosed. ?  Mid LAD lesion is 75% stenosed. ?  A drug-eluting stent was successfully placed using a STENT ONYX FRONTIER 4.0X12,postdilated to 4.5 mm.  OCT not used due to renal insufficiency. ?  Post intervention, there is a 0% residual stenosis. ?  Ost 2nd Diag lesion is 75% stenosed. ?  Balloon angioplasty was performed using a BALLN SAPPHIRE 2.0X12. ?  Post intervention, there is a 10% residual stenosis. ?  LV end diastolic pressure is normal. ?  There is no aortic valve stenosis. ?  Tortuous right subclavian artery. ? ?  ?CPX 06/19/2016 ?- Limited due to submaximal effort and poor amount of information.  ?FVC 2.35 (65%)      ?FEV1 1.94 (66%)        ?FEV1/FVC 82 (102%)        ?MVV 117  (86%) ?Post-Exercise PFTs--------- Not performed due to sudden termination of exercise ?Exercise Time:    1:56            Watts: 10 ?RPE: 17 ?Resting HR: 70 Peak HR: 84   (50% age predicted max HR) ?BP rest: 148/82 BP peak: 156/84 ?Peak VO2: 9.7 (50% predicted peak VO2) ?VE/VCO2 slope:  30 ?OUES: 1.34 ?Peak RER: 0.97 ?Ventilatory Threshold: CANNOT BE DETERMINED ?Peak RR 78 ?Peak Ventilation:  28.8 ?VE/MVV:  25% ?PETCO2 at peak:  36 ?O2pulse:  11   (79% predicted O2pulse) ? ? ?Marco [name] denies  symptoms worrisome for COVID 19.  ? ?Past Medical History:  ?Diagnosis Date  ? Benign colon polyp 08/01/2013  ? [facility] in [place]. large base tranverse colon polyp was biopsied. polyp was benign with minimal surface hyperplastic change.  ? Chronic combined systolic and diastolic CHF (congestive heart failure) ([place])   ? Chronic pain   ? CKD (chronic kidney disease), stage III ([place])   ? Diabetes mellitus without complication ([place])   ? Diverticulosis   ? Esophageal hiatal hernia 07/29/2013  ? confirmed on EGD   ? Esophageal stricture   ? Essential hypertension   ? Gastritis 07/29/2013  ? confirmed on EGD, bx done an negative for intestinal metaplasia, dsyplasia or H. pylori. normal gastric emptying study done 07/13/2013.  ?  GERD (gastroesophageal reflux disease)   ? Morbid obesity ([place])   ? Non-obstructive CAD   ? Coronary CTA 04/2021 showed coronary calcium score of 535 which is 93rd % for age and sex matched controls.  There is mild CAD with mild calcified plaque in the distal LAD 25-49% and also in the ostial D3.  There is minimal calcified plaque in the mid LCx and mild mixed plaque in the pRCA with 25-49% stenosis.  ? Persistent atrial fibrillation ([place])   ? a. 02/2013 s/p rfca in [place]-->prev on Xarelto, d/c'd 2/2 anemia, ? GIB.  ? Rheumatoid arthritis ([place])   ? Sigmoid diverticulosis 08/01/2013  ? confirmed on colonscopy. record scanned into chart  ? ?Past Surgical History:  ?Procedure Laterality Date  ? CARDIAC CATHETERIZATION  05/2014  ? ablation for atrial fibrillation  ? CARDIAC CATHETERIZATION N/A 11/16/2015  ? Procedure: Left Heart Cath and Coronary Angiography;  Surgeon: Leonie Man, MD;  Location: [place] [facility];  Service: Cardiovascular;  Laterality: N/A;  ? COLON RESECTION  09/2013  ? due to large, abnormal polpy. non cancerous per patient.   ? COLON SURGERY  09/2014  ? colon resection   ? CORONARY STENT INTERVENTION N/A 06/11/2021  ? Procedure: CORONARY STENT INTERVENTION;   Surgeon: Jettie Booze, MD;  Location: [facility] [facility];  Service: Cardiovascular;  Laterality: N/A;  ? EXTRACORPOREAL SHOCK WAVE LITHOTRIPSY Left 01/03/2021  ? Procedure: EXTRACORPOREAL SHOCK W

## 2021-10-24 ENCOUNTER — Encounter (HOSPITAL_COMMUNITY): Payer: Medicare Other

## 2021-10-28 ENCOUNTER — Other Ambulatory Visit (HOSPITAL_COMMUNITY): Payer: Self-pay

## 2021-10-30 ENCOUNTER — Ambulatory Visit: Payer: Medicare Other | Admitting: Podiatry

## 2021-10-30 ENCOUNTER — Other Ambulatory Visit (HOSPITAL_COMMUNITY): Payer: Self-pay

## 2021-10-30 ENCOUNTER — Ambulatory Visit: Payer: Medicare Other | Admitting: Cardiology

## 2021-11-01 ENCOUNTER — Other Ambulatory Visit (HOSPITAL_COMMUNITY): Payer: Self-pay

## 2021-11-06 ENCOUNTER — Other Ambulatory Visit (HOSPITAL_COMMUNITY): Payer: Self-pay

## 2021-11-08 DIAGNOSIS — R269 Unspecified abnormalities of gait and mobility: Secondary | ICD-10-CM | POA: Diagnosis not present

## 2021-11-08 DIAGNOSIS — G4733 Obstructive sleep apnea (adult) (pediatric): Secondary | ICD-10-CM | POA: Diagnosis not present

## 2021-11-08 DIAGNOSIS — R2681 Unsteadiness on feet: Secondary | ICD-10-CM | POA: Diagnosis not present

## 2021-11-08 DIAGNOSIS — G4700 Insomnia, unspecified: Secondary | ICD-10-CM | POA: Diagnosis not present

## 2021-11-09 DEATH — deceased

## 2021-11-11 ENCOUNTER — Telehealth: Payer: Self-pay | Admitting: Internal Medicine

## 2021-11-11 NOTE — Telephone Encounter (Signed)
Will forward to provider  

## 2021-11-11 NOTE — Telephone Encounter (Signed)
Copied from [place] [phone]. Topic: General - Inquiry >> Nov 08, 2021  2:21 PM Greggory Keen D wrote: Reason for CRM: Rollene Fare with [employer] service with a death cert that needs to be signed in [facility] Dates system   CB#  [phone]

## 2021-11-25 ENCOUNTER — Ambulatory Visit: Payer: Medicare Other | Admitting: Endocrinology

## 2021-11-28 ENCOUNTER — Other Ambulatory Visit (HOSPITAL_COMMUNITY): Payer: Self-pay

## 2021-12-11 ENCOUNTER — Other Ambulatory Visit: Payer: Medicare Other

## 2021-12-12 ENCOUNTER — Ambulatory Visit: Payer: Commercial Managed Care - HMO | Admitting: Internal Medicine

## 2022-02-18 ENCOUNTER — Ambulatory Visit: Payer: Medicare Other | Admitting: Internal Medicine

## 2022-08-20 ENCOUNTER — Other Ambulatory Visit (HOSPITAL_COMMUNITY): Payer: Self-pay

## 2023-04-16 NOTE — Progress Notes (Signed)
This encounter was created in error - please disregard.

## 2024-03-07 NOTE — Progress Notes (Signed)
 This encounter was created in error - please disregard.

## 2024-03-08 NOTE — Procedures (Signed)
Mask fit

## 2024-03-11 NOTE — Procedures (Signed)
Mask fit
